# Patient Record
Sex: Female | Born: 1940 | Race: White | Hispanic: No | Smoking: Former smoker
Health system: Southern US, Community
[De-identification: ages and names within clinical notes are randomized; demographics above are authoritative.]

## PROBLEM LIST (undated history)

## (undated) DIAGNOSIS — E119 Type 2 diabetes mellitus without complications: Secondary | ICD-10-CM

## (undated) DIAGNOSIS — R079 Chest pain, unspecified: Secondary | ICD-10-CM

## (undated) DIAGNOSIS — Z96659 Presence of unspecified artificial knee joint: Secondary | ICD-10-CM

## (undated) DIAGNOSIS — I471 Supraventricular tachycardia, unspecified: Secondary | ICD-10-CM

## (undated) DIAGNOSIS — B3731 Acute candidiasis of vulva and vagina: Secondary | ICD-10-CM

## (undated) DIAGNOSIS — N184 Chronic kidney disease, stage 4 (severe): Secondary | ICD-10-CM

## (undated) DIAGNOSIS — N952 Postmenopausal atrophic vaginitis: Secondary | ICD-10-CM

## (undated) DIAGNOSIS — I2510 Atherosclerotic heart disease of native coronary artery without angina pectoris: Secondary | ICD-10-CM

## (undated) DIAGNOSIS — F419 Anxiety disorder, unspecified: Secondary | ICD-10-CM

## (undated) DIAGNOSIS — N200 Calculus of kidney: Secondary | ICD-10-CM

## (undated) DIAGNOSIS — N390 Urinary tract infection, site not specified: Secondary | ICD-10-CM

## (undated) DIAGNOSIS — C50919 Malignant neoplasm of unspecified site of unspecified female breast: Secondary | ICD-10-CM

## (undated) DIAGNOSIS — I5030 Unspecified diastolic (congestive) heart failure: Secondary | ICD-10-CM

## (undated) DIAGNOSIS — B373 Candidiasis of vulva and vagina: Secondary | ICD-10-CM

## (undated) DIAGNOSIS — R32 Unspecified urinary incontinence: Secondary | ICD-10-CM

## (undated) DIAGNOSIS — M1990 Unspecified osteoarthritis, unspecified site: Secondary | ICD-10-CM

## (undated) DIAGNOSIS — I509 Heart failure, unspecified: Secondary | ICD-10-CM

## (undated) DIAGNOSIS — E7800 Pure hypercholesterolemia, unspecified: Secondary | ICD-10-CM

## (undated) DIAGNOSIS — K922 Gastrointestinal hemorrhage, unspecified: Secondary | ICD-10-CM

## (undated) DIAGNOSIS — E669 Obesity, unspecified: Secondary | ICD-10-CM

## (undated) DIAGNOSIS — R300 Dysuria: Secondary | ICD-10-CM

## (undated) DIAGNOSIS — I10 Essential (primary) hypertension: Secondary | ICD-10-CM

## (undated) HISTORY — DX: Malignant neoplasm of unspecified site of unspecified female breast: C50.919

## (undated) HISTORY — PX: NASAL SEPTUM SURGERY: SHX37

## (undated) HISTORY — PX: CARDIAC CATHETERIZATION: SHX172

## (undated) HISTORY — PX: JOINT REPLACEMENT: SHX530

## (undated) HISTORY — DX: Supraventricular tachycardia: I47.1

## (undated) HISTORY — DX: Calculus of kidney: N20.0

## (undated) HISTORY — DX: Acute candidiasis of vulva and vagina: B37.31

## (undated) HISTORY — DX: Type 2 diabetes mellitus without complications: E11.9

## (undated) HISTORY — DX: Heart failure, unspecified: I50.9

## (undated) HISTORY — PX: OTHER SURGICAL HISTORY: SHX169

## (undated) HISTORY — DX: Dysuria: R30.0

## (undated) HISTORY — PX: TONSILLECTOMY: SUR1361

## (undated) HISTORY — DX: Unspecified urinary incontinence: R32

## (undated) HISTORY — DX: Anxiety disorder, unspecified: F41.9

## (undated) HISTORY — PX: CHOLECYSTECTOMY: SHX55

## (undated) HISTORY — DX: Supraventricular tachycardia, unspecified: I47.10

## (undated) HISTORY — DX: Pure hypercholesterolemia, unspecified: E78.00

## (undated) HISTORY — DX: Obesity, unspecified: E66.9

## (undated) HISTORY — DX: Unspecified osteoarthritis, unspecified site: M19.90

## (undated) HISTORY — DX: Presence of unspecified artificial knee joint: Z96.659

## (undated) HISTORY — DX: Candidiasis of vulva and vagina: B37.3

## (undated) HISTORY — DX: Urinary tract infection, site not specified: N39.0

## (undated) HISTORY — DX: Postmenopausal atrophic vaginitis: N95.2

## (undated) HISTORY — PX: FOOT SURGERY: SHX648

## (undated) HISTORY — DX: Essential (primary) hypertension: I10

---

## 1995-10-19 DIAGNOSIS — C50919 Malignant neoplasm of unspecified site of unspecified female breast: Secondary | ICD-10-CM

## 1995-10-19 HISTORY — DX: Malignant neoplasm of unspecified site of unspecified female breast: C50.919

## 1995-10-19 HISTORY — PX: MASTECTOMY: SHX3

## 2004-04-19 ENCOUNTER — Other Ambulatory Visit: Payer: Self-pay

## 2004-10-05 ENCOUNTER — Ambulatory Visit: Payer: Self-pay | Admitting: Surgery

## 2005-04-09 ENCOUNTER — Emergency Department: Payer: Self-pay | Admitting: Emergency Medicine

## 2005-10-04 ENCOUNTER — Inpatient Hospital Stay: Payer: Self-pay | Admitting: Internal Medicine

## 2006-04-05 ENCOUNTER — Other Ambulatory Visit: Payer: Self-pay

## 2006-05-19 ENCOUNTER — Inpatient Hospital Stay: Payer: Self-pay | Admitting: Specialist

## 2006-05-26 ENCOUNTER — Encounter: Payer: Self-pay | Admitting: Internal Medicine

## 2006-07-19 ENCOUNTER — Other Ambulatory Visit: Payer: Self-pay

## 2006-07-19 ENCOUNTER — Emergency Department: Payer: Self-pay | Admitting: Emergency Medicine

## 2006-10-14 ENCOUNTER — Ambulatory Visit: Payer: Self-pay | Admitting: Surgery

## 2007-07-06 ENCOUNTER — Ambulatory Visit: Payer: Self-pay | Admitting: Urology

## 2007-07-12 ENCOUNTER — Emergency Department: Payer: Self-pay | Admitting: Emergency Medicine

## 2007-08-10 ENCOUNTER — Ambulatory Visit: Payer: Self-pay | Admitting: Gastroenterology

## 2007-10-16 ENCOUNTER — Ambulatory Visit: Payer: Self-pay | Admitting: Surgery

## 2008-07-16 ENCOUNTER — Emergency Department: Payer: Self-pay | Admitting: Emergency Medicine

## 2008-08-20 ENCOUNTER — Ambulatory Visit: Payer: Self-pay | Admitting: Surgery

## 2008-10-16 ENCOUNTER — Ambulatory Visit: Payer: Self-pay | Admitting: Surgery

## 2008-11-16 ENCOUNTER — Inpatient Hospital Stay: Payer: Self-pay | Admitting: Internal Medicine

## 2008-11-22 ENCOUNTER — Emergency Department: Payer: Self-pay | Admitting: Emergency Medicine

## 2008-12-25 ENCOUNTER — Emergency Department: Payer: Self-pay | Admitting: Emergency Medicine

## 2009-03-04 ENCOUNTER — Emergency Department: Payer: Self-pay | Admitting: Emergency Medicine

## 2009-04-08 ENCOUNTER — Emergency Department: Payer: Self-pay | Admitting: Emergency Medicine

## 2009-05-24 ENCOUNTER — Emergency Department: Payer: Self-pay | Admitting: Internal Medicine

## 2009-07-29 ENCOUNTER — Emergency Department: Payer: Self-pay | Admitting: Emergency Medicine

## 2009-09-16 ENCOUNTER — Observation Stay: Payer: Self-pay | Admitting: Specialist

## 2009-10-21 ENCOUNTER — Ambulatory Visit: Payer: Self-pay | Admitting: Surgery

## 2009-11-22 ENCOUNTER — Emergency Department: Payer: Self-pay | Admitting: Internal Medicine

## 2009-12-15 ENCOUNTER — Emergency Department: Payer: Self-pay | Admitting: Emergency Medicine

## 2010-10-22 ENCOUNTER — Ambulatory Visit: Payer: Self-pay | Admitting: Surgery

## 2011-01-06 ENCOUNTER — Ambulatory Visit: Payer: Self-pay | Admitting: Unknown Physician Specialty

## 2011-01-17 ENCOUNTER — Ambulatory Visit: Payer: Self-pay | Admitting: Unknown Physician Specialty

## 2011-02-19 ENCOUNTER — Ambulatory Visit: Payer: Self-pay | Admitting: Unknown Physician Specialty

## 2011-03-19 ENCOUNTER — Ambulatory Visit: Payer: Self-pay | Admitting: Unknown Physician Specialty

## 2011-10-25 ENCOUNTER — Ambulatory Visit: Payer: Self-pay | Admitting: Surgery

## 2011-12-31 DIAGNOSIS — E119 Type 2 diabetes mellitus without complications: Secondary | ICD-10-CM | POA: Insufficient documentation

## 2011-12-31 DIAGNOSIS — N390 Urinary tract infection, site not specified: Secondary | ICD-10-CM | POA: Insufficient documentation

## 2012-03-10 ENCOUNTER — Emergency Department: Payer: Self-pay | Admitting: Emergency Medicine

## 2012-03-10 LAB — COMPREHENSIVE METABOLIC PANEL
Albumin: 3.8 g/dL (ref 3.4–5.0)
Alkaline Phosphatase: 65 U/L (ref 50–136)
Anion Gap: 9 (ref 7–16)
Bilirubin,Total: 0.4 mg/dL (ref 0.2–1.0)
Chloride: 94 mmol/L — ABNORMAL LOW (ref 98–107)
Co2: 30 mmol/L (ref 21–32)
Creatinine: 0.76 mg/dL (ref 0.60–1.30)
EGFR (African American): >60
EGFR (Non-African Amer.): >60
SGOT(AST): 20 U/L (ref 15–37)
SGPT (ALT): 19 U/L

## 2012-03-10 LAB — URINALYSIS, COMPLETE
Bacteria: NONE SEEN
Blood: NEGATIVE
Glucose,UR: NEGATIVE mg/dL (ref 0–75)
Ketone: NEGATIVE
Nitrite: NEGATIVE
Specific Gravity: 1.003 (ref 1.003–1.030)
Squamous Epithelial: <1
WBC UR: 3 /HPF (ref 0–5)

## 2012-03-10 LAB — TSH: Thyroid Stimulating Horm: 1.71 u[IU]/mL

## 2012-03-10 LAB — CBC
HGB: 10 g/dL — ABNORMAL LOW (ref 12.0–16.0)
RDW: 17.1 % — ABNORMAL HIGH (ref 11.5–14.5)
WBC: 7.3 10*3/uL (ref 3.6–11.0)

## 2012-07-11 ENCOUNTER — Emergency Department: Payer: Self-pay | Admitting: Emergency Medicine

## 2012-07-11 LAB — COMPREHENSIVE METABOLIC PANEL
Alkaline Phosphatase: 71 U/L (ref 50–136)
BUN: 17 mg/dL (ref 7–18)
Bilirubin,Total: 0.5 mg/dL (ref 0.2–1.0)
Chloride: 93 mmol/L — ABNORMAL LOW (ref 98–107)
Co2: 28 mmol/L (ref 21–32)
Creatinine: 0.71 mg/dL (ref 0.60–1.30)
EGFR (African American): >60
EGFR (Non-African Amer.): >60
Glucose: 150 mg/dL — ABNORMAL HIGH (ref 65–99)
SGOT(AST): 30 U/L (ref 15–37)
SGPT (ALT): 18 U/L (ref 12–78)

## 2012-07-11 LAB — URINALYSIS, COMPLETE
Bilirubin,UR: NEGATIVE
Blood: NEGATIVE
Ketone: NEGATIVE
Nitrite: NEGATIVE
Ph: 6 (ref 4.5–8.0)
Protein: 100
RBC,UR: <1 /HPF (ref 0–5)
Squamous Epithelial: 2

## 2012-07-11 LAB — CBC
HGB: 9.8 g/dL — ABNORMAL LOW (ref 12.0–16.0)
MCH: 24.7 pg — ABNORMAL LOW (ref 26.0–34.0)
MCHC: 32.7 g/dL (ref 32.0–36.0)
Platelet: 280 10*3/uL (ref 150–440)
RBC: 3.96 10*6/uL (ref 3.80–5.20)
RDW: 15.8 % — ABNORMAL HIGH (ref 11.5–14.5)
WBC: 9.5 10*3/uL (ref 3.6–11.0)

## 2012-07-11 LAB — CK TOTAL AND CKMB (NOT AT ARMC)
CK, Total: 107 U/L (ref 21–215)
CK-MB: 2.5 ng/mL (ref 0.5–3.6)

## 2012-07-11 LAB — TROPONIN I: Troponin-I: <0.02 ng/mL

## 2012-07-12 ENCOUNTER — Inpatient Hospital Stay: Payer: Self-pay | Admitting: Internal Medicine

## 2012-07-12 LAB — GENTAMICIN LEVEL, RANDOM: Gentamicin, Random: 6.2 ug/mL

## 2012-07-12 LAB — OSMOLALITY, URINE: Osmolality: 567 mOsm/kg

## 2012-07-13 LAB — BASIC METABOLIC PANEL
Anion Gap: 8 (ref 7–16)
Calcium, Total: 9.2 mg/dL (ref 8.5–10.1)
Co2: 29 mmol/L (ref 21–32)
Creatinine: 0.78 mg/dL (ref 0.60–1.30)
EGFR (African American): >60
EGFR (Non-African Amer.): >60
Glucose: 179 mg/dL — ABNORMAL HIGH (ref 65–99)
Potassium: 3.9 mmol/L (ref 3.5–5.1)
Sodium: 136 mmol/L (ref 136–145)

## 2012-07-26 DIAGNOSIS — R300 Dysuria: Secondary | ICD-10-CM | POA: Insufficient documentation

## 2012-08-11 DIAGNOSIS — N3020 Other chronic cystitis without hematuria: Secondary | ICD-10-CM | POA: Insufficient documentation

## 2012-10-27 ENCOUNTER — Emergency Department: Payer: Self-pay | Admitting: Emergency Medicine

## 2012-10-27 LAB — CBC
HCT: 27.9 % — ABNORMAL LOW (ref 35.0–47.0)
MCH: 24.7 pg — ABNORMAL LOW (ref 26.0–34.0)
MCHC: 32.4 g/dL (ref 32.0–36.0)
MCV: 76 fL — ABNORMAL LOW (ref 80–100)
Platelet: 288 10*3/uL (ref 150–440)
RBC: 3.66 10*6/uL — ABNORMAL LOW (ref 3.80–5.20)
WBC: 7.8 10*3/uL (ref 3.6–11.0)

## 2012-10-27 LAB — COMPREHENSIVE METABOLIC PANEL
Albumin: 3.6 g/dL (ref 3.4–5.0)
Anion Gap: 8 (ref 7–16)
Bilirubin,Total: 0.4 mg/dL (ref 0.2–1.0)
Calcium, Total: 9 mg/dL (ref 8.5–10.1)
Creatinine: 0.92 mg/dL (ref 0.60–1.30)
EGFR (African American): >60
Osmolality: 275 (ref 275–301)
Potassium: 4.4 mmol/L (ref 3.5–5.1)
SGOT(AST): 19 U/L (ref 15–37)
SGPT (ALT): 17 U/L (ref 12–78)
Sodium: 135 mmol/L — ABNORMAL LOW (ref 136–145)
Total Protein: 7.2 g/dL (ref 6.4–8.2)

## 2012-10-27 LAB — MAGNESIUM: Magnesium: 1.6 mg/dL — ABNORMAL LOW

## 2012-10-27 LAB — URINALYSIS, COMPLETE
Ketone: NEGATIVE
WBC UR: 9 /HPF (ref 0–5)

## 2012-10-27 LAB — CK TOTAL AND CKMB (NOT AT ARMC)
CK, Total: 76 U/L (ref 21–215)
CK-MB: 2.7 ng/mL (ref 0.5–3.6)

## 2012-10-27 LAB — TROPONIN I: Troponin-I: <0.02 ng/mL

## 2012-10-29 LAB — URINE CULTURE

## 2012-10-31 ENCOUNTER — Ambulatory Visit: Payer: Self-pay | Admitting: Surgery

## 2012-11-06 ENCOUNTER — Ambulatory Visit: Payer: Self-pay | Admitting: Oncology

## 2012-11-18 ENCOUNTER — Ambulatory Visit: Payer: Self-pay | Admitting: Oncology

## 2012-12-29 ENCOUNTER — Ambulatory Visit: Payer: Self-pay | Admitting: Oncology

## 2013-01-05 LAB — CBC CANCER CENTER
Basophil %: 0.6 %
Eosinophil #: 0.4 x10 3/mm (ref 0.0–0.7)
Eosinophil %: 5.2 %
HCT: 36.4 % (ref 35.0–47.0)
Lymphocyte %: 28.6 %
MCH: 27.4 pg (ref 26.0–34.0)
MCHC: 33.1 g/dL (ref 32.0–36.0)
Monocyte #: 0.8 x10 3/mm (ref 0.2–0.9)
Monocyte %: 10.8 %
RBC: 4.41 10*6/uL (ref 3.80–5.20)
WBC: 7 x10 3/mm (ref 3.6–11.0)

## 2013-01-05 LAB — IRON AND TIBC
Iron Bind.Cap.(Total): 394 ug/dL (ref 250–450)
Iron Saturation: 20 %
Iron: 78 ug/dL (ref 50–170)

## 2013-01-05 LAB — FERRITIN: Ferritin (ARMC): 28 ng/mL (ref 8–388)

## 2013-01-05 LAB — FOLATE: Folic Acid: 53.1 ng/mL (ref 3.1–100.0)

## 2013-01-16 ENCOUNTER — Ambulatory Visit: Payer: Self-pay | Admitting: Oncology

## 2013-02-19 DIAGNOSIS — R350 Frequency of micturition: Secondary | ICD-10-CM | POA: Insufficient documentation

## 2013-02-19 DIAGNOSIS — N3941 Urge incontinence: Secondary | ICD-10-CM | POA: Insufficient documentation

## 2013-04-06 ENCOUNTER — Ambulatory Visit: Payer: Self-pay | Admitting: Oncology

## 2013-04-09 LAB — CBC CANCER CENTER
Basophil #: 0.1 x10 3/mm (ref 0.0–0.1)
Eosinophil #: 0.4 x10 3/mm (ref 0.0–0.7)
Eosinophil %: 5.3 %
HCT: 36 % (ref 35.0–47.0)
HGB: 12.3 g/dL (ref 12.0–16.0)
Lymphocyte #: 2.1 x10 3/mm (ref 1.0–3.6)
Lymphocyte %: 25.6 %
MCH: 29 pg (ref 26.0–34.0)
MCHC: 34.2 g/dL (ref 32.0–36.0)
Monocyte #: 1 x10 3/mm — ABNORMAL HIGH (ref 0.2–0.9)
Platelet: 281 x10 3/mm (ref 150–440)
RBC: 4.25 10*6/uL (ref 3.80–5.20)
WBC: 8 x10 3/mm (ref 3.6–11.0)

## 2013-04-09 LAB — IRON AND TIBC
Iron: 88 ug/dL (ref 50–170)
Unbound Iron-Bind.Cap.: 354 ug/dL

## 2013-04-17 ENCOUNTER — Ambulatory Visit: Payer: Self-pay | Admitting: Oncology

## 2013-07-09 ENCOUNTER — Ambulatory Visit: Payer: Self-pay | Admitting: Oncology

## 2013-07-09 LAB — CBC CANCER CENTER
Eosinophil #: 0.4 x10 3/mm (ref 0.0–0.7)
Eosinophil %: 5.3 %
Lymphocyte #: 1.9 x10 3/mm (ref 1.0–3.6)
Lymphocyte %: 25.9 %
MCH: 28.2 pg (ref 26.0–34.0)
MCHC: 33.6 g/dL (ref 32.0–36.0)
MCV: 84 fL (ref 80–100)
Monocyte %: 10.8 %
RDW: 14.6 % — ABNORMAL HIGH (ref 11.5–14.5)
WBC: 7.3 x10 3/mm (ref 3.6–11.0)

## 2013-07-09 LAB — IRON AND TIBC
Iron Bind.Cap.(Total): 422 ug/dL (ref 250–450)
Iron: 74 ug/dL (ref 50–170)
Unbound Iron-Bind.Cap.: 348 ug/dL

## 2013-07-09 LAB — FERRITIN: Ferritin (ARMC): 17 ng/mL (ref 8–388)

## 2013-07-18 ENCOUNTER — Ambulatory Visit: Payer: Self-pay | Admitting: Oncology

## 2013-07-23 LAB — CBC
HCT: 35.3 % (ref 35.0–47.0)
MCV: 85 fL (ref 80–100)
RBC: 4.16 10*6/uL (ref 3.80–5.20)
WBC: 7 10*3/uL (ref 3.6–11.0)

## 2013-07-23 LAB — COMPREHENSIVE METABOLIC PANEL
Albumin: 3.9 g/dL (ref 3.4–5.0)
Anion Gap: 3 — ABNORMAL LOW (ref 7–16)
Creatinine: 0.88 mg/dL (ref 0.60–1.30)
EGFR (African American): >60
EGFR (Non-African Amer.): >60
Glucose: 175 mg/dL — ABNORMAL HIGH (ref 65–99)
Potassium: 4.2 mmol/L (ref 3.5–5.1)
SGPT (ALT): 20 U/L (ref 12–78)
Sodium: 133 mmol/L — ABNORMAL LOW (ref 136–145)

## 2013-07-23 LAB — TROPONIN I: Troponin-I: <0.02 ng/mL

## 2013-07-24 ENCOUNTER — Inpatient Hospital Stay: Payer: Self-pay | Admitting: Internal Medicine

## 2013-07-24 LAB — CK TOTAL AND CKMB (NOT AT ARMC)
CK, Total: 104 U/L (ref 21–215)
CK, Total: 106 U/L (ref 21–215)
CK-MB: 3 ng/mL (ref 0.5–3.6)
CK-MB: 3 ng/mL (ref 0.5–3.6)

## 2013-07-24 LAB — TROPONIN I
Troponin-I: <0.02 ng/mL
Troponin-I: <0.02 ng/mL

## 2013-07-24 LAB — TSH: Thyroid Stimulating Horm: 1.61 u[IU]/mL

## 2013-07-25 LAB — COMPREHENSIVE METABOLIC PANEL
Albumin: 3.2 g/dL — ABNORMAL LOW (ref 3.4–5.0)
BUN: 18 mg/dL (ref 7–18)
Bilirubin,Total: 0.4 mg/dL (ref 0.2–1.0)
Calcium, Total: 9.5 mg/dL (ref 8.5–10.1)
Co2: 34 mmol/L — ABNORMAL HIGH (ref 21–32)
Creatinine: 1 mg/dL (ref 0.60–1.30)
EGFR (African American): >60
EGFR (Non-African Amer.): 57 — ABNORMAL LOW
Glucose: 161 mg/dL — ABNORMAL HIGH (ref 65–99)
Potassium: 3.6 mmol/L (ref 3.5–5.1)
SGOT(AST): 17 U/L (ref 15–37)
SGPT (ALT): 16 U/L (ref 12–78)

## 2013-07-25 LAB — CBC WITH DIFFERENTIAL/PLATELET
Basophil %: 0.4 %
Eosinophil #: 0.3 10*3/uL (ref 0.0–0.7)
HCT: 33.3 % — ABNORMAL LOW (ref 35.0–47.0)
Lymphocyte #: 2.1 10*3/uL (ref 1.0–3.6)
Lymphocyte %: 28.8 %
MCH: 28.6 pg (ref 26.0–34.0)
MCV: 85 fL (ref 80–100)
Monocyte %: 11.6 %
Neutrophil %: 55.2 %
Platelet: 266 10*3/uL (ref 150–440)
RDW: 14.8 % — ABNORMAL HIGH (ref 11.5–14.5)
WBC: 7.3 10*3/uL (ref 3.6–11.0)

## 2013-07-26 LAB — PRO B NATRIURETIC PEPTIDE: B-Type Natriuretic Peptide: 483 pg/mL — ABNORMAL HIGH (ref 0–125)

## 2013-07-27 LAB — URINALYSIS, COMPLETE
Bilirubin,UR: NEGATIVE
Nitrite: NEGATIVE
Ph: 5 (ref 4.5–8.0)
Protein: 30
RBC,UR: 2 /HPF (ref 0–5)
Specific Gravity: 1.006 (ref 1.003–1.030)
Squamous Epithelial: 2
WBC UR: 303 /HPF (ref 0–5)

## 2013-07-27 LAB — BASIC METABOLIC PANEL
Co2: 34 mmol/L — ABNORMAL HIGH (ref 21–32)
Creatinine: 1.11 mg/dL (ref 0.60–1.30)
EGFR (Non-African Amer.): 50 — ABNORMAL LOW
Glucose: 252 mg/dL — ABNORMAL HIGH (ref 65–99)
Osmolality: 282 (ref 275–301)
Potassium: 4.1 mmol/L (ref 3.5–5.1)

## 2013-07-28 LAB — BASIC METABOLIC PANEL
Anion Gap: 3 — ABNORMAL LOW (ref 7–16)
Calcium, Total: 9.5 mg/dL (ref 8.5–10.1)
Chloride: 95 mmol/L — ABNORMAL LOW (ref 98–107)
Co2: 34 mmol/L — ABNORMAL HIGH (ref 21–32)
Creatinine: 1.3 mg/dL (ref 0.60–1.30)
EGFR (African American): 48 — ABNORMAL LOW
Glucose: 236 mg/dL — ABNORMAL HIGH (ref 65–99)
Potassium: 3.7 mmol/L (ref 3.5–5.1)

## 2013-07-29 LAB — CBC WITH DIFFERENTIAL/PLATELET
Basophil #: 0 10*3/uL (ref 0.0–0.1)
Basophil %: 0.7 %
Eosinophil #: 0.8 10*3/uL — ABNORMAL HIGH (ref 0.0–0.7)
Eosinophil %: 12.2 %
HCT: 33.7 % — ABNORMAL LOW (ref 35.0–47.0)
HGB: 11.6 g/dL — ABNORMAL LOW (ref 12.0–16.0)
Lymphocyte #: 1.8 10*3/uL (ref 1.0–3.6)
Lymphocyte %: 28.5 %
MCH: 29.2 pg (ref 26.0–34.0)
MCHC: 34.5 g/dL (ref 32.0–36.0)
Monocyte #: 0.9 x10 3/mm (ref 0.2–0.9)
Monocyte %: 13.7 %
Neutrophil #: 2.9 10*3/uL (ref 1.4–6.5)
Neutrophil %: 44.9 %
Platelet: 244 10*3/uL (ref 150–440)
RBC: 3.99 10*6/uL (ref 3.80–5.20)
RDW: 14.4 % (ref 11.5–14.5)
WBC: 6.4 10*3/uL (ref 3.6–11.0)

## 2013-07-29 LAB — BASIC METABOLIC PANEL
Anion Gap: 4 — ABNORMAL LOW (ref 7–16)
Calcium, Total: 9.5 mg/dL (ref 8.5–10.1)
Co2: 34 mmol/L — ABNORMAL HIGH (ref 21–32)
EGFR (Non-African Amer.): 40 — ABNORMAL LOW
Osmolality: 285 (ref 275–301)
Sodium: 132 mmol/L — ABNORMAL LOW (ref 136–145)

## 2013-07-29 LAB — URINE CULTURE

## 2013-07-30 LAB — BASIC METABOLIC PANEL
BUN: 38 mg/dL — ABNORMAL HIGH (ref 7–18)
Calcium, Total: 9.4 mg/dL (ref 8.5–10.1)
EGFR (African American): 47 — ABNORMAL LOW
EGFR (Non-African Amer.): 41 — ABNORMAL LOW
Glucose: 347 mg/dL — ABNORMAL HIGH (ref 65–99)
Osmolality: 289 (ref 275–301)
Sodium: 133 mmol/L — ABNORMAL LOW (ref 136–145)

## 2013-07-30 LAB — PRO B NATRIURETIC PEPTIDE: B-Type Natriuretic Peptide: 233 pg/mL — ABNORMAL HIGH (ref 0–125)

## 2013-07-31 LAB — COMPREHENSIVE METABOLIC PANEL
Alkaline Phosphatase: 79 U/L (ref 50–136)
BUN: 35 mg/dL — ABNORMAL HIGH (ref 7–18)
Bilirubin,Total: 0.4 mg/dL (ref 0.2–1.0)
Chloride: 94 mmol/L — ABNORMAL LOW (ref 98–107)
Co2: 34 mmol/L — ABNORMAL HIGH (ref 21–32)
Creatinine: 1.34 mg/dL — ABNORMAL HIGH (ref 0.60–1.30)
EGFR (African American): 46 — ABNORMAL LOW
EGFR (Non-African Amer.): 40 — ABNORMAL LOW
SGOT(AST): 33 U/L (ref 15–37)
Sodium: 132 mmol/L — ABNORMAL LOW (ref 136–145)
Total Protein: 7.5 g/dL (ref 6.4–8.2)

## 2013-08-01 LAB — BASIC METABOLIC PANEL
Anion Gap: 5 — ABNORMAL LOW (ref 7–16)
BUN: 42 mg/dL — ABNORMAL HIGH (ref 7–18)
Calcium, Total: 9.3 mg/dL (ref 8.5–10.1)
Chloride: 95 mmol/L — ABNORMAL LOW (ref 98–107)
Co2: 34 mmol/L — ABNORMAL HIGH (ref 21–32)
Creatinine: 1.76 mg/dL — ABNORMAL HIGH (ref 0.60–1.30)
EGFR (Non-African Amer.): 29 — ABNORMAL LOW
Glucose: 246 mg/dL — ABNORMAL HIGH (ref 65–99)
Potassium: 4.1 mmol/L (ref 3.5–5.1)
Sodium: 134 mmol/L — ABNORMAL LOW (ref 136–145)

## 2013-08-24 ENCOUNTER — Inpatient Hospital Stay: Payer: Self-pay

## 2013-08-24 LAB — TROPONIN I
Troponin-I: 0.38 ng/mL — ABNORMAL HIGH
Troponin-I: 0.39 ng/mL — ABNORMAL HIGH

## 2013-08-24 LAB — COMPREHENSIVE METABOLIC PANEL
Alkaline Phosphatase: 93 U/L (ref 50–136)
Anion Gap: 6 — ABNORMAL LOW (ref 7–16)
Bilirubin,Total: 0.7 mg/dL (ref 0.2–1.0)
Calcium, Total: 9.4 mg/dL (ref 8.5–10.1)
Chloride: 102 mmol/L (ref 98–107)
Creatinine: 1.28 mg/dL (ref 0.60–1.30)
EGFR (Non-African Amer.): 42 — ABNORMAL LOW
SGOT(AST): 16 U/L (ref 15–37)
SGPT (ALT): 16 U/L (ref 12–78)
Sodium: 136 mmol/L (ref 136–145)
Total Protein: 7.1 g/dL (ref 6.4–8.2)

## 2013-08-24 LAB — URINALYSIS, COMPLETE
Bilirubin,UR: NEGATIVE
Glucose,UR: 50 mg/dL (ref 0–75)
Nitrite: POSITIVE
Ph: 5 (ref 4.5–8.0)
Protein: >=500
Squamous Epithelial: <1
WBC UR: 273 /HPF (ref 0–5)

## 2013-08-24 LAB — CBC WITH DIFFERENTIAL/PLATELET
Basophil #: 0 10*3/uL (ref 0.0–0.1)
Eosinophil #: 0.3 10*3/uL (ref 0.0–0.7)
Eosinophil %: 3.6 %
HCT: 31.4 % — ABNORMAL LOW (ref 35.0–47.0)
HGB: 10.7 g/dL — ABNORMAL LOW (ref 12.0–16.0)
MCHC: 34.1 g/dL (ref 32.0–36.0)
MCV: 86 fL (ref 80–100)
Monocyte %: 6.2 %
Platelet: 200 10*3/uL (ref 150–440)
RBC: 3.67 10*6/uL — ABNORMAL LOW (ref 3.80–5.20)
RDW: 15 % — ABNORMAL HIGH (ref 11.5–14.5)

## 2013-08-24 LAB — CK TOTAL AND CKMB (NOT AT ARMC)
CK, Total: 71 U/L
CK, Total: 73 U/L (ref 21–215)
CK, Total: 65 U/L (ref 21–215)
CK-MB: 0.7 ng/mL

## 2013-08-24 LAB — PROTIME-INR
INR: 1.2
Prothrombin Time: 14.9 secs — ABNORMAL HIGH (ref 11.5–14.7)

## 2013-08-24 LAB — PRO B NATRIURETIC PEPTIDE: B-Type Natriuretic Peptide: 2083 pg/mL — ABNORMAL HIGH (ref 0–125)

## 2013-08-24 LAB — CK-MB
CK-MB: 1.5 ng/mL (ref 0.5–3.6)
CK-MB: 1.4 ng/mL (ref 0.5–3.6)

## 2013-08-25 LAB — CBC WITH DIFFERENTIAL/PLATELET
Basophil #: 0 10*3/uL (ref 0.0–0.1)
Eosinophil #: 0 10*3/uL (ref 0.0–0.7)
Eosinophil %: 0.2 %
HGB: 9.9 g/dL — ABNORMAL LOW (ref 12.0–16.0)
Lymphocyte #: 1 10*3/uL (ref 1.0–3.6)
Lymphocyte %: 15.9 %
Monocyte #: 0.6 x10 3/mm (ref 0.2–0.9)
Monocyte %: 10.1 %
RBC: 3.43 10*6/uL — ABNORMAL LOW (ref 3.80–5.20)
RDW: 14.6 % — ABNORMAL HIGH (ref 11.5–14.5)
WBC: 6.1 10*3/uL (ref 3.6–11.0)

## 2013-08-25 LAB — BASIC METABOLIC PANEL
BUN: 28 mg/dL — ABNORMAL HIGH (ref 7–18)
Chloride: 98 mmol/L (ref 98–107)
Co2: 33 mmol/L — ABNORMAL HIGH (ref 21–32)
EGFR (African American): 40 — ABNORMAL LOW
EGFR (Non-African Amer.): 34 — ABNORMAL LOW
Glucose: 264 mg/dL — ABNORMAL HIGH (ref 65–99)
Osmolality: 283 (ref 275–301)
Potassium: 4.7 mmol/L (ref 3.5–5.1)
Sodium: 134 mmol/L — ABNORMAL LOW (ref 136–145)

## 2013-08-26 LAB — BASIC METABOLIC PANEL
Anion Gap: 3 — ABNORMAL LOW (ref 7–16)
BUN: 33 mg/dL — ABNORMAL HIGH (ref 7–18)
Co2: 34 mmol/L — ABNORMAL HIGH (ref 21–32)
Creatinine: 1.48 mg/dL — ABNORMAL HIGH (ref 0.60–1.30)
Osmolality: 281 (ref 275–301)
Sodium: 134 mmol/L — ABNORMAL LOW (ref 136–145)

## 2013-08-26 LAB — URINE CULTURE

## 2013-08-27 LAB — BASIC METABOLIC PANEL
BUN: 24 mg/dL — ABNORMAL HIGH (ref 7–18)
Calcium, Total: 9.2 mg/dL (ref 8.5–10.1)
Chloride: 94 mmol/L — ABNORMAL LOW (ref 98–107)
Co2: 37 mmol/L — ABNORMAL HIGH (ref 21–32)
Creatinine: 1.17 mg/dL (ref 0.60–1.30)
EGFR (African American): 55 — ABNORMAL LOW
EGFR (Non-African Amer.): 47 — ABNORMAL LOW
Osmolality: 274 (ref 275–301)
Potassium: 4.3 mmol/L (ref 3.5–5.1)

## 2013-08-28 LAB — BASIC METABOLIC PANEL
Anion Gap: 1 — ABNORMAL LOW (ref 7–16)
Calcium, Total: 9.1 mg/dL (ref 8.5–10.1)
Chloride: 93 mmol/L — ABNORMAL LOW (ref 98–107)
Co2: 39 mmol/L — ABNORMAL HIGH (ref 21–32)
Creatinine: 1.09 mg/dL (ref 0.60–1.30)
EGFR (African American): 60 — ABNORMAL LOW
EGFR (Non-African Amer.): 51 — ABNORMAL LOW
Glucose: 196 mg/dL — ABNORMAL HIGH (ref 65–99)
Osmolality: 275 (ref 275–301)
Potassium: 4.1 mmol/L (ref 3.5–5.1)

## 2013-08-29 LAB — CULTURE, BLOOD (SINGLE)

## 2013-08-31 LAB — CULTURE, BLOOD (SINGLE)

## 2013-11-01 ENCOUNTER — Ambulatory Visit: Payer: Self-pay | Admitting: Surgery

## 2013-11-08 ENCOUNTER — Ambulatory Visit: Payer: Self-pay | Admitting: Oncology

## 2013-11-08 LAB — CBC CANCER CENTER
BASOS PCT: 0.6 %
Basophil #: 0 x10 3/mm (ref 0.0–0.1)
EOS ABS: 0.4 x10 3/mm (ref 0.0–0.7)
EOS PCT: 4.9 %
HCT: 33.5 % — ABNORMAL LOW (ref 35.0–47.0)
HGB: 10.9 g/dL — ABNORMAL LOW (ref 12.0–16.0)
LYMPHS ABS: 2.4 x10 3/mm (ref 1.0–3.6)
LYMPHS PCT: 31.5 %
MCH: 28.5 pg (ref 26.0–34.0)
MCHC: 32.5 g/dL (ref 32.0–36.0)
MCV: 88 fL (ref 80–100)
MONO ABS: 0.9 x10 3/mm (ref 0.2–0.9)
Monocyte %: 11.3 %
NEUTROS ABS: 3.9 x10 3/mm (ref 1.4–6.5)
NEUTROS PCT: 51.7 %
Platelet: 260 x10 3/mm (ref 150–440)
RBC: 3.82 10*6/uL (ref 3.80–5.20)
RDW: 14.9 % — AB (ref 11.5–14.5)
WBC: 7.6 x10 3/mm (ref 3.6–11.0)

## 2013-11-08 LAB — IRON AND TIBC
IRON BIND. CAP.(TOTAL): 359 ug/dL (ref 250–450)
IRON SATURATION: 21 %
IRON: 76 ug/dL (ref 50–170)
Unbound Iron-Bind.Cap.: 283 ug/dL

## 2013-11-08 LAB — FERRITIN: Ferritin (ARMC): 27 ng/mL (ref 8–388)

## 2013-11-18 ENCOUNTER — Ambulatory Visit: Payer: Self-pay | Admitting: Oncology

## 2014-05-17 ENCOUNTER — Inpatient Hospital Stay: Payer: Self-pay | Admitting: Specialist

## 2014-05-17 LAB — CBC
HCT: 30.9 % — ABNORMAL LOW (ref 35.0–47.0)
HGB: 10.1 g/dL — ABNORMAL LOW (ref 12.0–16.0)
MCH: 28.2 pg (ref 26.0–34.0)
MCHC: 32.7 g/dL (ref 32.0–36.0)
MCV: 86 fL (ref 80–100)
PLATELETS: 216 10*3/uL (ref 150–440)
RBC: 3.59 10*6/uL — ABNORMAL LOW (ref 3.80–5.20)
RDW: 14.4 % (ref 11.5–14.5)
WBC: 6.5 10*3/uL (ref 3.6–11.0)

## 2014-05-17 LAB — URINALYSIS, COMPLETE
BILIRUBIN, UR: NEGATIVE
KETONE: NEGATIVE
NITRITE: NEGATIVE
Ph: 6 (ref 4.5–8.0)
RBC,UR: 1 /HPF (ref 0–5)
SPECIFIC GRAVITY: 1.011 (ref 1.003–1.030)
WBC UR: 76 /HPF (ref 0–5)

## 2014-05-17 LAB — BASIC METABOLIC PANEL
Anion Gap: 3 — ABNORMAL LOW (ref 7–16)
BUN: 22 mg/dL — ABNORMAL HIGH (ref 7–18)
CHLORIDE: 98 mmol/L (ref 98–107)
CREATININE: 1.06 mg/dL (ref 0.60–1.30)
Calcium, Total: 9 mg/dL (ref 8.5–10.1)
Co2: 30 mmol/L (ref 21–32)
EGFR (African American): >60
EGFR (Non-African Amer.): 53 — ABNORMAL LOW
GLUCOSE: 204 mg/dL — AB (ref 65–99)
OSMOLALITY: 272 (ref 275–301)
POTASSIUM: 5.2 mmol/L — AB (ref 3.5–5.1)
SODIUM: 131 mmol/L — AB (ref 136–145)

## 2014-05-18 LAB — CBC WITH DIFFERENTIAL/PLATELET
Basophil #: 0 10*3/uL (ref 0.0–0.1)
Basophil %: 0.6 %
EOS PCT: 5.8 %
Eosinophil #: 0.4 10*3/uL (ref 0.0–0.7)
HCT: 30.7 % — ABNORMAL LOW (ref 35.0–47.0)
HGB: 10.3 g/dL — ABNORMAL LOW (ref 12.0–16.0)
Lymphocyte #: 1.6 10*3/uL (ref 1.0–3.6)
Lymphocyte %: 22.3 %
MCH: 28.8 pg (ref 26.0–34.0)
MCHC: 33.7 g/dL (ref 32.0–36.0)
MCV: 85 fL (ref 80–100)
MONO ABS: 0.8 x10 3/mm (ref 0.2–0.9)
Monocyte %: 10.9 %
NEUTROS PCT: 60.4 %
Neutrophil #: 4.5 10*3/uL (ref 1.4–6.5)
PLATELETS: 204 10*3/uL (ref 150–440)
RBC: 3.59 10*6/uL — AB (ref 3.80–5.20)
RDW: 14.2 % (ref 11.5–14.5)
WBC: 7.4 10*3/uL (ref 3.6–11.0)

## 2014-05-18 LAB — BASIC METABOLIC PANEL
ANION GAP: 6 — AB (ref 7–16)
BUN: 17 mg/dL (ref 7–18)
CALCIUM: 9.2 mg/dL (ref 8.5–10.1)
CO2: 28 mmol/L (ref 21–32)
CREATININE: 1.02 mg/dL (ref 0.60–1.30)
Chloride: 103 mmol/L (ref 98–107)
EGFR (African American): >60
EGFR (Non-African Amer.): 55 — ABNORMAL LOW
Glucose: 143 mg/dL — ABNORMAL HIGH (ref 65–99)
OSMOLALITY: 278 (ref 275–301)
Potassium: 4.4 mmol/L (ref 3.5–5.1)
SODIUM: 137 mmol/L (ref 136–145)

## 2014-05-19 LAB — BASIC METABOLIC PANEL
Anion Gap: 5 — ABNORMAL LOW (ref 7–16)
BUN: 23 mg/dL — ABNORMAL HIGH (ref 7–18)
CALCIUM: 9.3 mg/dL (ref 8.5–10.1)
CREATININE: 1.19 mg/dL (ref 0.60–1.30)
Chloride: 100 mmol/L (ref 98–107)
Co2: 34 mmol/L — ABNORMAL HIGH (ref 21–32)
EGFR (Non-African Amer.): 46 — ABNORMAL LOW
GFR CALC AF AMER: 53 — AB
Glucose: 125 mg/dL — ABNORMAL HIGH (ref 65–99)
Osmolality: 283 (ref 275–301)
Potassium: 4.3 mmol/L (ref 3.5–5.1)
Sodium: 139 mmol/L (ref 136–145)

## 2014-05-19 LAB — URINE CULTURE

## 2014-05-22 ENCOUNTER — Observation Stay: Payer: Self-pay | Admitting: Specialist

## 2014-05-22 LAB — URINALYSIS, COMPLETE
BACTERIA: NONE SEEN
BILIRUBIN, UR: NEGATIVE
BLOOD: NEGATIVE
Glucose,UR: NEGATIVE mg/dL (ref 0–75)
Ketone: NEGATIVE
Leukocyte Esterase: NEGATIVE
Nitrite: NEGATIVE
Ph: 6 (ref 4.5–8.0)
Protein: 100
RBC,UR: NONE SEEN /HPF (ref 0–5)
Specific Gravity: 1.014 (ref 1.003–1.030)
Squamous Epithelial: NONE SEEN
WBC UR: NONE SEEN /HPF (ref 0–5)

## 2014-05-22 LAB — CBC
HCT: 33.4 % — ABNORMAL LOW (ref 35.0–47.0)
HGB: 11 g/dL — ABNORMAL LOW (ref 12.0–16.0)
MCH: 28.2 pg (ref 26.0–34.0)
MCHC: 32.9 g/dL (ref 32.0–36.0)
MCV: 86 fL (ref 80–100)
PLATELETS: 238 10*3/uL (ref 150–440)
RBC: 3.9 10*6/uL (ref 3.80–5.20)
RDW: 14.4 % (ref 11.5–14.5)
WBC: 7.1 10*3/uL (ref 3.6–11.0)

## 2014-05-22 LAB — COMPREHENSIVE METABOLIC PANEL
ALBUMIN: 3.2 g/dL — AB (ref 3.4–5.0)
ALT: 22 U/L
ANION GAP: 5 — AB (ref 7–16)
AST: 26 U/L (ref 15–37)
Alkaline Phosphatase: 62 U/L
BUN: 29 mg/dL — AB (ref 7–18)
Bilirubin,Total: 0.3 mg/dL (ref 0.2–1.0)
CALCIUM: 8.8 mg/dL (ref 8.5–10.1)
Chloride: 99 mmol/L (ref 98–107)
Co2: 29 mmol/L (ref 21–32)
Creatinine: 1.2 mg/dL (ref 0.60–1.30)
EGFR (African American): 53 — ABNORMAL LOW
GFR CALC NON AF AMER: 45 — AB
Glucose: 107 mg/dL — ABNORMAL HIGH (ref 65–99)
Osmolality: 273 (ref 275–301)
POTASSIUM: 4.7 mmol/L (ref 3.5–5.1)
Sodium: 133 mmol/L — ABNORMAL LOW (ref 136–145)
Total Protein: 7.1 g/dL (ref 6.4–8.2)

## 2014-05-22 LAB — TROPONIN I

## 2014-05-23 LAB — BASIC METABOLIC PANEL
ANION GAP: 7 (ref 7–16)
BUN: 20 mg/dL — ABNORMAL HIGH (ref 7–18)
CREATININE: 1.1 mg/dL (ref 0.60–1.30)
Calcium, Total: 8.9 mg/dL (ref 8.5–10.1)
Chloride: 96 mmol/L — ABNORMAL LOW (ref 98–107)
Co2: 31 mmol/L (ref 21–32)
EGFR (Non-African Amer.): 50 — ABNORMAL LOW
GFR CALC AF AMER: 58 — AB
Glucose: 160 mg/dL — ABNORMAL HIGH (ref 65–99)
Osmolality: 274 (ref 275–301)
Potassium: 4.6 mmol/L (ref 3.5–5.1)
SODIUM: 134 mmol/L — AB (ref 136–145)

## 2014-05-23 LAB — CBC WITH DIFFERENTIAL/PLATELET
Basophil #: 0 10*3/uL (ref 0.0–0.1)
Basophil %: 0.6 %
EOS ABS: 0.7 10*3/uL (ref 0.0–0.7)
Eosinophil %: 9.5 %
HCT: 34 % — AB (ref 35.0–47.0)
HGB: 11.3 g/dL — ABNORMAL LOW (ref 12.0–16.0)
Lymphocyte #: 1.9 10*3/uL (ref 1.0–3.6)
Lymphocyte %: 26.1 %
MCH: 28.7 pg (ref 26.0–34.0)
MCHC: 33.4 g/dL (ref 32.0–36.0)
MCV: 86 fL (ref 80–100)
Monocyte #: 0.7 x10 3/mm (ref 0.2–0.9)
Monocyte %: 10.1 %
Neutrophil #: 3.9 10*3/uL (ref 1.4–6.5)
Neutrophil %: 53.7 %
Platelet: 256 10*3/uL (ref 150–440)
RBC: 3.96 10*6/uL (ref 3.80–5.20)
RDW: 14.6 % — AB (ref 11.5–14.5)
WBC: 7.2 10*3/uL (ref 3.6–11.0)

## 2014-05-24 LAB — URINE CULTURE

## 2014-05-27 LAB — CULTURE, BLOOD (SINGLE)

## 2014-05-28 ENCOUNTER — Emergency Department: Payer: Self-pay | Admitting: Emergency Medicine

## 2014-05-28 LAB — BASIC METABOLIC PANEL
Anion Gap: 5 — ABNORMAL LOW (ref 7–16)
BUN: 26 mg/dL — AB (ref 7–18)
CHLORIDE: 99 mmol/L (ref 98–107)
CREATININE: 1.27 mg/dL (ref 0.60–1.30)
Calcium, Total: 9 mg/dL (ref 8.5–10.1)
Co2: 30 mmol/L (ref 21–32)
EGFR (African American): 49 — ABNORMAL LOW
GFR CALC NON AF AMER: 42 — AB
GLUCOSE: 143 mg/dL — AB (ref 65–99)
Osmolality: 275 (ref 275–301)
Potassium: 4.5 mmol/L (ref 3.5–5.1)
Sodium: 134 mmol/L — ABNORMAL LOW (ref 136–145)

## 2014-05-28 LAB — CBC WITH DIFFERENTIAL/PLATELET
BASOS PCT: 1 %
Basophil #: 0.1 10*3/uL (ref 0.0–0.1)
EOS PCT: 7.5 %
Eosinophil #: 0.4 10*3/uL (ref 0.0–0.7)
HCT: 32 % — AB (ref 35.0–47.0)
HGB: 10.6 g/dL — AB (ref 12.0–16.0)
LYMPHS PCT: 27.2 %
Lymphocyte #: 1.5 10*3/uL (ref 1.0–3.6)
MCH: 28.3 pg (ref 26.0–34.0)
MCHC: 33.2 g/dL (ref 32.0–36.0)
MCV: 85 fL (ref 80–100)
MONO ABS: 0.8 x10 3/mm (ref 0.2–0.9)
MONOS PCT: 13.9 %
NEUTROS PCT: 50.4 %
Neutrophil #: 2.7 10*3/uL (ref 1.4–6.5)
Platelet: 248 10*3/uL (ref 150–440)
RBC: 3.76 10*6/uL — AB (ref 3.80–5.20)
RDW: 14.6 % — AB (ref 11.5–14.5)
WBC: 5.4 10*3/uL (ref 3.6–11.0)

## 2014-05-28 LAB — URINALYSIS, COMPLETE
BILIRUBIN, UR: NEGATIVE
Bacteria: NONE SEEN
Blood: NEGATIVE
GLUCOSE, UR: NEGATIVE mg/dL (ref 0–75)
Ketone: NEGATIVE
Leukocyte Esterase: NEGATIVE
NITRITE: NEGATIVE
Ph: 5 (ref 4.5–8.0)
Protein: 100
RBC,UR: 4 /HPF (ref 0–5)
SPECIFIC GRAVITY: 1.018 (ref 1.003–1.030)
Squamous Epithelial: 1

## 2014-07-09 ENCOUNTER — Emergency Department: Payer: Self-pay | Admitting: Emergency Medicine

## 2014-07-09 LAB — URINALYSIS, COMPLETE
BILIRUBIN, UR: NEGATIVE
Bacteria: NONE SEEN
Blood: NEGATIVE
Glucose,UR: NEGATIVE mg/dL (ref 0–75)
Ketone: NEGATIVE
LEUKOCYTE ESTERASE: NEGATIVE
Nitrite: NEGATIVE
PH: 5 (ref 4.5–8.0)
Protein: 25
RBC,UR: <1 /HPF (ref 0–5)
SPECIFIC GRAVITY: 1.005 (ref 1.003–1.030)
WBC UR: 1 /HPF (ref 0–5)

## 2014-07-09 LAB — BASIC METABOLIC PANEL
ANION GAP: 4 — AB (ref 7–16)
BUN: 28 mg/dL — AB (ref 7–18)
CHLORIDE: 101 mmol/L (ref 98–107)
CREATININE: 1.21 mg/dL (ref 0.60–1.30)
Calcium, Total: 8.9 mg/dL (ref 8.5–10.1)
Co2: 31 mmol/L (ref 21–32)
EGFR (African American): 52 — ABNORMAL LOW
GFR CALC NON AF AMER: 45 — AB
GLUCOSE: 174 mg/dL — AB (ref 65–99)
Osmolality: 282 (ref 275–301)
POTASSIUM: 4.6 mmol/L (ref 3.5–5.1)
Sodium: 136 mmol/L (ref 136–145)

## 2014-07-09 LAB — CBC
HCT: 31.1 % — ABNORMAL LOW (ref 35.0–47.0)
HGB: 9.7 g/dL — ABNORMAL LOW (ref 12.0–16.0)
MCH: 27.6 pg (ref 26.0–34.0)
MCHC: 31.2 g/dL — AB (ref 32.0–36.0)
MCV: 88 fL (ref 80–100)
Platelet: 226 10*3/uL (ref 150–440)
RBC: 3.52 10*6/uL — ABNORMAL LOW (ref 3.80–5.20)
RDW: 14.9 % — ABNORMAL HIGH (ref 11.5–14.5)
WBC: 6.8 10*3/uL (ref 3.6–11.0)

## 2014-07-09 LAB — PRO B NATRIURETIC PEPTIDE: B-Type Natriuretic Peptide: 1872 pg/mL — ABNORMAL HIGH (ref 0–125)

## 2014-09-02 ENCOUNTER — Inpatient Hospital Stay: Payer: Self-pay | Admitting: Internal Medicine

## 2014-09-02 LAB — CBC WITH DIFFERENTIAL/PLATELET
BASOS ABS: 0 10*3/uL (ref 0.0–0.1)
Basophil %: 0.4 %
Eosinophil #: 0.2 10*3/uL (ref 0.0–0.7)
Eosinophil %: 4.3 %
HCT: 24.3 % — ABNORMAL LOW (ref 35.0–47.0)
HGB: 7.5 g/dL — ABNORMAL LOW (ref 12.0–16.0)
LYMPHS ABS: 0.8 10*3/uL — AB (ref 1.0–3.6)
Lymphocyte %: 13.8 %
MCH: 27.3 pg (ref 26.0–34.0)
MCHC: 31.1 g/dL — ABNORMAL LOW (ref 32.0–36.0)
MCV: 88 fL (ref 80–100)
MONO ABS: 0.7 x10 3/mm (ref 0.2–0.9)
Monocyte %: 11.7 %
Neutrophil #: 4 10*3/uL (ref 1.4–6.5)
Neutrophil %: 69.8 %
PLATELETS: 261 10*3/uL (ref 150–440)
RBC: 2.76 10*6/uL — AB (ref 3.80–5.20)
RDW: 14.6 % — AB (ref 11.5–14.5)
WBC: 5.8 10*3/uL (ref 3.6–11.0)

## 2014-09-02 LAB — URINALYSIS, COMPLETE
BILIRUBIN, UR: NEGATIVE
Glucose,UR: NEGATIVE mg/dL (ref 0–75)
Hyaline Cast: 15
Ketone: NEGATIVE
NITRITE: NEGATIVE
PH: 5 (ref 4.5–8.0)
Protein: 30
RBC,UR: 7 /HPF (ref 0–5)
Specific Gravity: 1.016 (ref 1.003–1.030)
Squamous Epithelial: 1
WBC UR: 6 /HPF (ref 0–5)

## 2014-09-02 LAB — RETICULOCYTES
ABSOLUTE RETIC COUNT: 0.097 10*6/uL (ref 0.019–0.186)
Reticulocyte: 3.5 % — ABNORMAL HIGH (ref 0.4–3.1)

## 2014-09-02 LAB — BASIC METABOLIC PANEL
Anion Gap: 5 — ABNORMAL LOW (ref 7–16)
BUN: 34 mg/dL — AB (ref 7–18)
CALCIUM: 8.2 mg/dL — AB (ref 8.5–10.1)
Chloride: 103 mmol/L (ref 98–107)
Co2: 30 mmol/L (ref 21–32)
Creatinine: 1.28 mg/dL (ref 0.60–1.30)
EGFR (Non-African Amer.): 44 — ABNORMAL LOW
GFR CALC AF AMER: 53 — AB
Glucose: 281 mg/dL — ABNORMAL HIGH (ref 65–99)
Osmolality: 293 (ref 275–301)
POTASSIUM: 4.7 mmol/L (ref 3.5–5.1)
Sodium: 138 mmol/L (ref 136–145)

## 2014-09-02 LAB — IRON AND TIBC
IRON BIND. CAP.(TOTAL): 438 ug/dL (ref 250–450)
Iron Saturation: 4 %
Iron: 18 ug/dL — ABNORMAL LOW (ref 50–170)
Unbound Iron-Bind.Cap.: 420 ug/dL

## 2014-09-02 LAB — TROPONIN I: Troponin-I: <0.02 ng/mL

## 2014-09-02 LAB — FERRITIN: FERRITIN (ARMC): 8 ng/mL (ref 8–388)

## 2014-09-03 LAB — CBC WITH DIFFERENTIAL/PLATELET
BASOS ABS: 0 10*3/uL (ref 0.0–0.1)
Basophil %: 0.6 %
Eosinophil #: 0.2 10*3/uL (ref 0.0–0.7)
Eosinophil %: 3.7 %
HCT: 26.9 % — AB (ref 35.0–47.0)
HGB: 8.7 g/dL — ABNORMAL LOW (ref 12.0–16.0)
Lymphocyte #: 1.5 10*3/uL (ref 1.0–3.6)
Lymphocyte %: 25.4 %
MCH: 27.7 pg (ref 26.0–34.0)
MCHC: 32.4 g/dL (ref 32.0–36.0)
MCV: 86 fL (ref 80–100)
MONO ABS: 0.7 x10 3/mm (ref 0.2–0.9)
Monocyte %: 12.1 %
Neutrophil #: 3.4 10*3/uL (ref 1.4–6.5)
Neutrophil %: 58.2 %
Platelet: 258 10*3/uL (ref 150–440)
RBC: 3.14 10*6/uL — ABNORMAL LOW (ref 3.80–5.20)
RDW: 14.4 % (ref 11.5–14.5)
WBC: 5.8 10*3/uL (ref 3.6–11.0)

## 2014-09-03 LAB — BASIC METABOLIC PANEL
Anion Gap: 4 — ABNORMAL LOW (ref 7–16)
BUN: 26 mg/dL — ABNORMAL HIGH (ref 7–18)
CO2: 32 mmol/L (ref 21–32)
Calcium, Total: 8.8 mg/dL (ref 8.5–10.1)
Chloride: 101 mmol/L (ref 98–107)
Creatinine: 1.12 mg/dL (ref 0.60–1.30)
EGFR (Non-African Amer.): 51 — ABNORMAL LOW
GLUCOSE: 88 mg/dL (ref 65–99)
Osmolality: 278 (ref 275–301)
Potassium: 4.2 mmol/L (ref 3.5–5.1)
Sodium: 137 mmol/L (ref 136–145)

## 2014-09-04 LAB — HEMOGLOBIN: HGB: 8.2 g/dL — ABNORMAL LOW (ref 12.0–16.0)

## 2014-09-04 LAB — HEMOGLOBIN A1C: Hemoglobin A1C: 6.2 % (ref 4.2–6.3)

## 2014-09-04 LAB — URINE CULTURE

## 2014-09-06 LAB — CBC WITH DIFFERENTIAL/PLATELET
BASOS ABS: 0 10*3/uL (ref 0.0–0.1)
Basophil %: 0.5 %
EOS PCT: 3.6 %
Eosinophil #: 0.2 10*3/uL (ref 0.0–0.7)
HCT: 26.3 % — ABNORMAL LOW (ref 35.0–47.0)
HGB: 8.3 g/dL — ABNORMAL LOW (ref 12.0–16.0)
Lymphocyte #: 1.1 10*3/uL (ref 1.0–3.6)
Lymphocyte %: 16.3 %
MCH: 27 pg (ref 26.0–34.0)
MCHC: 31.5 g/dL — AB (ref 32.0–36.0)
MCV: 86 fL (ref 80–100)
MONO ABS: 0.9 x10 3/mm (ref 0.2–0.9)
Monocyte %: 13.2 %
Neutrophil #: 4.3 10*3/uL (ref 1.4–6.5)
Neutrophil %: 66.4 %
Platelet: 236 10*3/uL (ref 150–440)
RBC: 3.07 10*6/uL — ABNORMAL LOW (ref 3.80–5.20)
RDW: 14.4 % (ref 11.5–14.5)
WBC: 6.5 10*3/uL (ref 3.6–11.0)

## 2014-09-06 LAB — BASIC METABOLIC PANEL
ANION GAP: 7 (ref 7–16)
BUN: 16 mg/dL (ref 7–18)
CALCIUM: 8.4 mg/dL — AB (ref 8.5–10.1)
CHLORIDE: 102 mmol/L (ref 98–107)
CREATININE: 1.39 mg/dL — AB (ref 0.60–1.30)
Co2: 29 mmol/L (ref 21–32)
EGFR (African American): 48 — ABNORMAL LOW
EGFR (Non-African Amer.): 40 — ABNORMAL LOW
GLUCOSE: 177 mg/dL — AB (ref 65–99)
Osmolality: 281 (ref 275–301)
POTASSIUM: 4.2 mmol/L (ref 3.5–5.1)
Sodium: 138 mmol/L (ref 136–145)

## 2014-09-07 LAB — BASIC METABOLIC PANEL
ANION GAP: 5 — AB (ref 7–16)
BUN: 17 mg/dL (ref 7–18)
CHLORIDE: 101 mmol/L (ref 98–107)
CREATININE: 1.49 mg/dL — AB (ref 0.60–1.30)
Calcium, Total: 8.5 mg/dL (ref 8.5–10.1)
Co2: 30 mmol/L (ref 21–32)
EGFR (African American): 44 — ABNORMAL LOW
EGFR (Non-African Amer.): 37 — ABNORMAL LOW
Glucose: 210 mg/dL — ABNORMAL HIGH (ref 65–99)
OSMOLALITY: 280 (ref 275–301)
Potassium: 4.9 mmol/L (ref 3.5–5.1)
Sodium: 136 mmol/L (ref 136–145)

## 2014-09-08 LAB — BASIC METABOLIC PANEL
ANION GAP: 6 — AB (ref 7–16)
BUN: 16 mg/dL (ref 7–18)
CHLORIDE: 103 mmol/L (ref 98–107)
CREATININE: 1.45 mg/dL — AB (ref 0.60–1.30)
Calcium, Total: 8.3 mg/dL — ABNORMAL LOW (ref 8.5–10.1)
Co2: 28 mmol/L (ref 21–32)
EGFR (African American): 46 — ABNORMAL LOW
EGFR (Non-African Amer.): 38 — ABNORMAL LOW
Glucose: 211 mg/dL — ABNORMAL HIGH (ref 65–99)
Osmolality: 281 (ref 275–301)
Potassium: 5.2 mmol/L — ABNORMAL HIGH (ref 3.5–5.1)
Sodium: 137 mmol/L (ref 136–145)

## 2014-09-08 LAB — HEMOGLOBIN: HGB: 8.1 g/dL — ABNORMAL LOW (ref 12.0–16.0)

## 2014-09-08 LAB — URINALYSIS, COMPLETE
BILIRUBIN, UR: NEGATIVE
GLUCOSE, UR: NEGATIVE mg/dL (ref 0–75)
Ketone: NEGATIVE
Nitrite: NEGATIVE
Ph: 5 (ref 4.5–8.0)
Protein: 30
SPECIFIC GRAVITY: 1.008 (ref 1.003–1.030)
Squamous Epithelial: <1

## 2014-09-09 LAB — CBC WITH DIFFERENTIAL/PLATELET
BASOS PCT: 0.7 %
Basophil #: 0 10*3/uL (ref 0.0–0.1)
Eosinophil #: 0.5 10*3/uL (ref 0.0–0.7)
Eosinophil %: 8.7 %
HCT: 27.7 % — AB (ref 35.0–47.0)
HGB: 8.8 g/dL — ABNORMAL LOW (ref 12.0–16.0)
LYMPHS ABS: 1.3 10*3/uL (ref 1.0–3.6)
Lymphocyte %: 22.1 %
MCH: 26.6 pg (ref 26.0–34.0)
MCHC: 31.8 g/dL — ABNORMAL LOW (ref 32.0–36.0)
MCV: 84 fL (ref 80–100)
Monocyte #: 1 x10 3/mm — ABNORMAL HIGH (ref 0.2–0.9)
Monocyte %: 16.1 %
NEUTROS PCT: 52.4 %
Neutrophil #: 3.2 10*3/uL (ref 1.4–6.5)
Platelet: 252 10*3/uL (ref 150–440)
RBC: 3.32 10*6/uL — ABNORMAL LOW (ref 3.80–5.20)
RDW: 14.5 % (ref 11.5–14.5)
WBC: 6.1 10*3/uL (ref 3.6–11.0)

## 2014-09-17 LAB — URINE CULTURE

## 2014-09-25 DIAGNOSIS — D649 Anemia, unspecified: Secondary | ICD-10-CM | POA: Insufficient documentation

## 2014-09-25 DIAGNOSIS — R1030 Lower abdominal pain, unspecified: Secondary | ICD-10-CM | POA: Insufficient documentation

## 2014-10-14 ENCOUNTER — Ambulatory Visit: Payer: Self-pay | Admitting: Oncology

## 2014-10-14 LAB — IRON AND TIBC
IRON BIND. CAP.(TOTAL): 415 ug/dL (ref 250–450)
IRON: 58 ug/dL (ref 50–170)
Iron Saturation: 14 %
Unbound Iron-Bind.Cap.: 357 ug/dL

## 2014-10-14 LAB — CBC CANCER CENTER
BASOS ABS: 0 x10 3/mm (ref 0.0–0.1)
BASOS PCT: 0.5 %
EOS ABS: 0.2 x10 3/mm (ref 0.0–0.7)
Eosinophil %: 3.5 %
HCT: 30.6 % — AB (ref 35.0–47.0)
HGB: 9.6 g/dL — ABNORMAL LOW (ref 12.0–16.0)
LYMPHS PCT: 23.1 %
Lymphocyte #: 1.4 x10 3/mm (ref 1.0–3.6)
MCH: 25.2 pg — ABNORMAL LOW (ref 26.0–34.0)
MCHC: 31.3 g/dL — ABNORMAL LOW (ref 32.0–36.0)
MCV: 81 fL (ref 80–100)
MONO ABS: 0.6 x10 3/mm (ref 0.2–0.9)
MONOS PCT: 10 %
NEUTROS ABS: 3.9 x10 3/mm (ref 1.4–6.5)
Neutrophil %: 62.9 %
Platelet: 256 x10 3/mm (ref 150–440)
RBC: 3.79 10*6/uL — ABNORMAL LOW (ref 3.80–5.20)
RDW: 15.5 % — ABNORMAL HIGH (ref 11.5–14.5)
WBC: 6.3 x10 3/mm (ref 3.6–11.0)

## 2014-10-14 LAB — LACTATE DEHYDROGENASE: LDH: 216 U/L (ref 81–246)

## 2014-10-14 LAB — FERRITIN: Ferritin (ARMC): 8 ng/mL (ref 8–388)

## 2014-10-14 LAB — FOLATE: FOLIC ACID: 93.7 ng/mL — AB (ref 3.1–17.5)

## 2014-10-18 ENCOUNTER — Ambulatory Visit: Payer: Self-pay | Admitting: Oncology

## 2014-10-28 ENCOUNTER — Ambulatory Visit: Payer: Self-pay | Admitting: Urology

## 2014-10-31 LAB — CBC CANCER CENTER
Basophil #: 0 x10 3/mm (ref 0.0–0.1)
Basophil %: 0.5 %
EOS PCT: 5.9 %
Eosinophil #: 0.3 x10 3/mm (ref 0.0–0.7)
HCT: 32.3 % — ABNORMAL LOW (ref 35.0–47.0)
HGB: 10.1 g/dL — AB (ref 12.0–16.0)
LYMPHS ABS: 1.4 x10 3/mm (ref 1.0–3.6)
LYMPHS PCT: 26.4 %
MCH: 25.1 pg — AB (ref 26.0–34.0)
MCHC: 31.2 g/dL — ABNORMAL LOW (ref 32.0–36.0)
MCV: 80 fL (ref 80–100)
MONO ABS: 0.7 x10 3/mm (ref 0.2–0.9)
Monocyte %: 12.6 %
NEUTROS ABS: 2.8 x10 3/mm (ref 1.4–6.5)
Neutrophil %: 54.6 %
Platelet: 254 x10 3/mm (ref 150–440)
RBC: 4.03 10*6/uL (ref 3.80–5.20)
RDW: 17.4 % — ABNORMAL HIGH (ref 11.5–14.5)
WBC: 5.2 x10 3/mm (ref 3.6–11.0)

## 2014-10-31 LAB — IRON AND TIBC
IRON SATURATION: 25 %
Iron Bind.Cap.(Total): 403 ug/dL (ref 250–450)
Iron: 100 ug/dL (ref 50–170)
UNBOUND IRON-BIND. CAP.: 303 ug/dL

## 2014-10-31 LAB — FERRITIN: FERRITIN (ARMC): 160 ng/mL (ref 8–388)

## 2014-11-04 ENCOUNTER — Ambulatory Visit: Payer: Self-pay | Admitting: Surgery

## 2014-11-18 ENCOUNTER — Ambulatory Visit: Payer: Self-pay | Admitting: Oncology

## 2014-12-17 ENCOUNTER — Ambulatory Visit
Admit: 2014-12-17 | Disposition: A | Discharge status: Home / Self Care | Payer: Self-pay | Attending: Oncology | Admitting: Oncology

## 2015-02-04 NOTE — Consult Note (Signed)
PATIENT NAME:  Carolyn Valdez, Carolyn Valdez MR#:  [id number] DATE OF BIRTH:  10-Jul-1941  DATE OF CONSULTATION:  07/12/2012  REFERRING PHYSICIAN:  Dr Manuella Ghazi CONSULTING PHYSICIAN:  Heinz Knuckles. Lita Flynn, MD  REASON FOR CONSULTATION: Urinary tract infection.   HISTORY OF PRESENT ILLNESS: The patient is a 74 year old white female with a past history significant for urinary incontinence with bladder spasm and recurrent urinary tract infections and diabetes who was admitted yesterday with worsening urinary tract symptoms. The patient has been seen by me several times in the office setting, most recently on 07/07/2012. She had been seen on 06/30/2012 with symptoms of a urinary tract infection for about a week. She had burning and moderate pain. There was no fever or urgency at that time. She had dropped a urine off at the office on 06/26/2012 and a urine dip demonstrated 2+ leukocytes, negative nitrites. Formal urinalysis showed 3+ leukocyte esterase with positive nitrites, and a culture grew greater than 100,000 CFU/mL of ESBL producing Escherichia coli and greater than 100,000 CFU/mL of a moderately sensitive serratia species. At her visit on 06/30/2012 she was given ciprofloxacin with the hope of overwhelming the ESBL producing organism. She returned on 07/07/2012 to the office with improvement in her dysuria, but significant increase in her urinary frequency. In the office a urine dip showed 1+ leukocytes and positive nitrites. A repeat culture was obtained with the urinalysis. The urinalysis at that time showed 2+ leukocyte esterase, positive nitrites; and the culture again grew the resistant Escherichia coli. She was complaining of worsening urinary symptoms and we had made arrangements to have a PICC line placed and start IV therapy; however, she had worsening of her symptoms to such an extent that she went to the emergency room. In the emergency room, they gave her a dose of ceftriaxone and sent her home. She called me again  last night saying her symptoms were significantly worse again and went back to the emergency room which subsequently led to her hospitalization.   She currently states that she is having dysuria. She is also having significant frequency every 20 minutes. She has not had fevers, chills, or sweats. She has no back pain. She has no nausea or vomiting. She has no significant abdominal pain. Last night in addition to her urinary symptoms she also was concerned that she might be having an allergic reaction to the ceftriaxone. She apparently had been given this in her primary care doctor's office in the past and had some neurologic symptoms. It somewhat difficult to pin her down on whether she was actually having similar symptoms or if it was just a worsening urinary symptoms that made her concerned. She was admitted to the hospital and started on Levaquin. A urinalysis shows no significant inflammation currently.   ALLERGIES: Morphine, prednisone, sulfa drugs, and possibly ceftriaxone, fluconazole, H1N1 influenza vaccine, and Pyridium.   PAST MEDICAL HISTORY:  1. Breast cancer 1996.  2. Congestive heart failure.  3. Hypertension.  4. Diabetes.  5. Urinary incontinence with bladder spasm.  6. Allergic rhinitis.  7. Recurrent urinary tract infections.   FAMILY HISTORY: Negative for diabetes. Positive for lung cancer and hypertension.   SOCIAL HISTORY: The patient is a prior smoker. She lives alone. She has a dog at home. She does not drink alcohol.   REVIEW OF SYSTEMS: GENERAL: No fevers, chills, or sweats. Positive malaise. HEENT: No headaches, no sinus congestion. No sore throat. NECK: No stiffness, no swollen glands. RESPIRATORY: No cough, no shortness of breath, no  sputum production. CARDIAC: No chest pains or palpitations. GI: No nausea, no vomiting, no abdominal pain, no change in her bowels. GENITOURINARY: Positive urinary frequency every 20 minutes with dysuria, some incomplete emptying symptoms,  no hesitancy. No hematuria. No flank pain. MUSCULOSKELETAL: No complaints. SKIN: No rashes. NEUROLOGIC: No focal confusion or focal weakness. PSYCHIATRIC: No complaints. All other systems are negative.   PHYSICAL EXAMINATION:  VITAL SIGNS: T-max of 98.8, T-current of 98.8, pulse 71, blood pressure 152/77, 95% on room air.   GENERAL: Obese 74 year old white female in no acute distress.   HEENT: Normocephalic, atraumatic. Pupils equal, reactive to light. Extraocular motion intact. Sclerae, conjunctivae, and lids are without evidence for emboli or petechiae. Lips and gums are in good condition.   NECK: Midline trachea. No lymphadenopathy. No thyromegaly.   LUNGS: Clear to auscultation bilaterally with good air movement. No focal consolidation.   HEART: Regular rate and rhythm without murmur, rub, or gallop.   ABDOMEN: Soft, nontender, nondistended. No hepatosplenomegaly. No hernia is noted.   EXTREMITIES: No evidence for tenosynovitis.   SKIN: No rashes. No stigmata of endocarditis, specifically no Janeway lesions or Osler nodes.   NEUROLOGIC: The patient is awake and interactive, moving all 4 extremities.   PSYCHIATRIC: Mood and affect appeared normal.   LABORATORY DATA: BUN of 17, creatinine 0.71, sodium of 129, bicarbonate of 28, anion gap of 8.0. LFTs are unremarkable. White count of 9.5 with a hemoglobin 9.8, platelet count of 280,000. Urinalysis had 100 mg/dL protein, trace leukocyte esterase, negative nitrites, less than 1 red cell, 18 white cells per high powered field. There is no culture data to review. A chest x-ray from admission showed cardiomegaly and possibly underlying fibrosis or interstitial edema.   IMPRESSION: A 74 year old white female with a history of urinary incontinence and recurrent urinary tract infections and diabetes who was admitted with worsening dysuria and urinary frequency.   RECOMMENDATIONS:  1. In the office on 07/07/2012 she had a positive urinalysis  with a urine culture that grew greater than 100,000 CFU/mL of ESBL producing Escherichia coli. She was scheduled to get a PICC line placed and start IV therapy, but she went to the emergency room due to worsening symptoms and was given ceftriaxone IV x1 with a plan to come to my office today and get a PICC line and then have 2 weeks of IV therapy. She returned to the ER again last night as she was concerned over possible allergic reaction to ceftriaxone. Her urinary frequency and dysuria persists. 2. Her urinalysis looks good in house but she continues to have symptoms. She may be partially treated with a dose of ceftriaxone although this should not have activity against ESBL producing Escherichia coli. I am surprised that her symptoms are still so bad without evidence for infection. She does have a history of bladder spasms as well.  3. As her most recent cultures showed resistance to all oral therapy, we will change her to gentamicin. We will give a dose today and then tomorrow. It may be possible to give a shorter course of gentamicin, then ertapenem which would be needed for the ESBL producing Escherichia coli. If she is clinically better then we can consider stopping and have her return to my office in a week to see how she is doing.  4. If she has not improved we will need to consider urology consult for possible noninfectious etiologies to her symptoms.  5. She is not getting her Myrbetriq.  We will add this  to her regimen.   This is a moderately complex infectious disease consult. Thank you very much for involving me in Ms. Farve's care.      ____________________________ Heinz Knuckles. Nosson Wender, MD meb:vtd D: 07/12/2012 14:52:46 ET T: 07/12/2012 15:38:31 ET JOB#: [id number]  cc: Heinz Knuckles. Lilyth Lawyer, MD, <Dictator> Christyne Mccain E Covey Baller MD ELECTRONICALLY SIGNED 07/17/2012 8:03

## 2015-02-04 NOTE — Discharge Summary (Signed)
PATIENT NAME:  Carolyn Valdez, Carolyn Valdez MR#:  [id number] DATE OF BIRTH:  November 23, 1940  DATE OF ADMISSION:  07/12/2012 DATE OF DISCHARGE:  07/14/2012   DISCHARGE DIAGNOSES:  1. Extended-spectrum beta-lactamase producing Escherichia coli urinary tract infection, much improved. Outpatient follow-up with Dr. Clayborn Bigness.  2. Hyponatremia, improved with IV fluids.   SECONDARY DIAGNOSES:  1. Hypertension.  2. Type 2 diabetes.  3. Obesity. 4. Hyperlipidemia.  5. History of breast cancer.   CONSULTANT: Lanae Boast, MD - Infectious Disease.   PROCEDURES/RADIOLOGY: Chest x-ray on 07/11/2012 showed no acute cardiopulmonary disease, mild interstitial edema.   MAJOR LABORATORY PANEL: Urinalysis on admission showed 3+ bacteria, 18 WBCs, and trace leukocyte esterase.   Urine culture could not performed due to inadequate sample.   HISTORY AND SHORT HOSPITAL COURSE: The patient is a 74 year old female with the above-mentioned medical problems who was admitted for recurrent, persistent urinary tract infection along with hyponatremia. She was started on IV Rocephin and infectious disease consultation was obtained with Dr. Clayborn Bigness. Please see Dr. Zacarias Pontes dictated history and physical for further details. She was growing ESBL producing Escherichia coli and Dr. Clayborn Bigness recommended changing her antibiotic to gentamicin, which was done. She was doing much better on 07/14/2012 and was discharged home in stable condition. She did have some hyponatremia and was improving with hydration and she had a normal sodium.  DISCHARGE PHYSICAL EXAMINATION:  VITALS: On the date of discharge, her temperature was 97.8, heart rate 65 per minute, respirations 20 per minute, blood pressure 170/62 mmHg, and she was saturating 96% on room air.   CARDIOVASCULAR: S1 and S2 normal. No murmurs, rubs, or gallops.   PULMONARY: Lungs are clear to auscultation bilaterally. No wheezes, rales, rhonchi, or crepitation.   ABDOMEN: Soft, benign.    NEUROLOGIC: Nonfocal examination. All other physical examination remained at baseline.   DISCHARGE MEDICATIONS:  1. VESIcare 10 mg p.o. daily. 2. Myrbetriq 50 mg p.o. daily.  3. Colace 100 mg two capsules p.o. twice a day. 4. Klonopin 1 mg p.o. three times daily. 5. Metoprolol 50 mg p.o. every morning and 2 tablets every evening.  6. Neurontin 300 mg p.o. three times daily.  7. Prilosec 40 mg p.o. twice a day.  8. Allegra 1 tablet p.o. daily.  9. Lipitor 20 mg p.o. daily.  10. Hydroxyzine 25 mg p.o. every evening. 11. Evista 60 mg p.o. every evening.  12. Actos 30 mg p.o. daily. 13. Cozaar 50 mg p.o. daily. 14. Relafen 500 mg p.o. twice a day. 15. Glucophage 1000 mg p.o. twice a day. 16. Glucophage 500 mg 1 tablet at lunch.  17. Glucotrol 10 mg two tablets p.o. every morning.  18. DDAVP 0.2 mg p.o. every evening. 19. Aspirin 81 mg p.o. daily. 20. Vitamin E one capsule p.o. daily. 21. Ferrous sulfate 325 mg p.o. daily. 22. Insulin NovoLog subcutaneous as needed, per sliding scale. 23. Insulin Lantus 50 units subcutaneous at bedtime.   DISCHARGE DIET: Low sodium, 1800 ADA.   DISCHARGE ACTIVITY: As tolerated.   DISCHARGE INSTRUCTIONS AND FOLLOW-UP: The patient was instructed to follow-up with her primary care physician, Dr. Kem Kays, in 1 to 2 weeks. She will need follow-up with Dr. Legrand Como Blocker in 1 to 2 weeks.   TOTAL DISCHARGE TIME: 55 minutes.  ____________________________ Lucina Mellow. Manuella Ghazi, MD vss:slb D: 07/18/2012 00:08:12 ET T: 07/18/2012 10:52:10 ET JOB#: [id number]  cc: Tiearra Colwell S. Manuella Ghazi, MD, <Dictator> Lorenza Evangelist, MD [place] Blocker, MD Remer Macho MD ELECTRONICALLY SIGNED 07/18/2012 13:39

## 2015-02-04 NOTE — H&P (Signed)
PATIENT NAME:  Carolyn Valdez, REPA MR#:  [id number] DATE OF BIRTH:  1941-07-20  DATE OF ADMISSION:  07/12/2012  PRIMARY CARE PHYSICIAN: Lanae Boast, MD   CHIEF COMPLAINT: Shaking, frequency of urination, low-grade fever, nausea.   HISTORY OF PRESENT ILLNESS: Carolyn Valdez is a 74 year old Caucasian female with history of multiple medical problems. Currently she is following up with Dr. Clayborn Bigness who had treated her for a urinary tract infection, however, her symptoms have persisted. She came to the Emergency Department yesterday, received a dose of Rocephin, and to follow-up the following day with Dr. Clayborn Bigness. However, upon going home she states that she had shaking chills, low-grade fever, and frequency of urination that she had to go to the bathroom every five minutes. She has nausea but no vomiting. She also reported diarrhea yesterday but this did not persist. She called her primary care physician and directed her to come to the Emergency Department for possibility of underlying resistant bacteria. The patient was admitted for further evaluation and treatment.   REVIEW OF SYSTEMS: CONSTITUTIONAL: She reports low-grade fever earlier, chills and mild fatigue. EYES: No blurring of vision. No double vision. ENT: No hearing impairment. No sore throat. No dysphagia. CARDIOVASCULAR: No chest pain. No shortness of breath. No syncope. RESPIRATORY: No cough. No sputum production. No chest pain. No shortness of breath. GASTROINTESTINAL: No abdominal pain but reports nausea. She had diarrhea yesterday, several bouts, but none in the last several hours. GENITOURINARY: Reports frequency of urination every five minutes. Mild dysuria. MUSCULOSKELETAL: No joint pain or swelling. No muscular pain or swelling. INTEGUMENTARY: No skin rash. No ulcers. NEUROLOGY: No focal weakness. No seizure activity. No headache. PSYCHIATRY: No anxiety or depression currently but she has history of anxiety. ENDOCRINE: No polyuria or polydipsia other  than the frequency of urination, sometimes only a little urine. No heat or cold intolerance.   PAST MEDICAL HISTORY:  1. Systemic hypertension. 2. Diabetes mellitus, type II. 3. Obesity. 4. Hyperlipidemia. 5. History of breast cancer, status post right mastectomy.   PAST SURGICAL HISTORY:  1. Right mastectomy. 2. Cholecystectomy. 3. Right knee replacement. 4. Left knee arthroscopy. 5. Rotator cuff surgery.   SOCIAL HABITS: Nonsmoker. No history of alcohol or drug abuse.   SOCIAL HISTORY: She is widowed. Lives at home alone.   FAMILY HISTORY: Her father died from lung cancer. Her mother suffers from hypertension.   ADMISSION MEDICATIONS:  1. Actos 30 mg a day. 2. Allegra 1 tablet once a day, dose not specified.  3. Aspirin 81 mg a day. 4. Cozaar 50 mg once a day.  5. DDAVP 0.2 mg once a day in the evening. 6. Colace 100 mg 2 capsules twice a day. 7. Evista 60 mg once a day.  8. Ferrous sulfate or iron sulfate 325 mg once a day. 9. Glucophage 1000 mg in the morning and at night and 500 mg at lunch.  10. Glucotrol 10 mg taking 2 tablets in the morning, that is 20 mg in the morning.  11. Hydroxyzine 25 mg in the evening. 12. Klonopin 1 mg 3 times a day.  13. Lantus 50 units at bedtime.  14. NovoLog sliding scale.  15. Lipitor 20 mg in the evening. 16. Metoprolol 50 mg in the morning and 100 mg in the evening.  17. Neurontin 300 mg 3 times a day.  18. Prilosec 40 mg twice a day.  19. Relafen 500 mg twice a day. 20. VESIcare 10 mg once a day. 21. Vitamin E 1 capsule  once a day.   ALLERGIES: Morphine, prednisone, sulfa, latex.   PHYSICAL EXAMINATION:   VITAL SIGNS: Blood pressure 173/75, respiratory rate 18, pulse 87, temperature 99.6, oxygen saturation 93%.   GENERAL APPEARANCE: Elderly female laying in bed in no acute distress, morbidly obese.   HEAD: Mild pallor. No icterus. No cyanosis.   EARS, NOSE, AND THROAT: Hearing was normal. Nasal mucosa, lips, tongue were  normal.   EYES: Normal iris and conjunctivae. Pupils about 4 to 5 mm, equal and reactive to light.   NECK: Supple. Trachea at midline. No cervical masses.   HEART: Normal S1, S2. No S3, S4. No murmur. No gallop. No carotid bruits.   RESPIRATORY: Normal breathing pattern without use of accessory muscles. No rales. No wheezing.   ABDOMEN: Soft without tenderness. She is morbidly obese. No hepatosplenomegaly. No masses. No hernias.   SKIN: No ulcers. No subcutaneous nodules.   MUSCULOSKELETAL: No joint swelling. No clubbing.   NEUROLOGIC: Cranial nerves II through XII were intact. No focal motor deficit.   PSYCHIATRIC: The patient is alert and oriented x3. Mood and affect were normal.   LABORATORY, DIAGNOSTIC, AND RADIOLOGICAL DATA: Serum glucose 150, BUN 17, creatinine 0.7, sodium 129, potassium 4.9, serum calculated osmolality 263, calcium 8.8. Normal liver function tests. Troponin less than 0.02. CBC showed white count of 9000, hemoglobin 9.8, hematocrit 30, platelet count 280, MCV 76. Urinalysis showed cloudy urine, 18 white blood cells, +3 bacteria.   ASSESSMENT:  1. Persistent lower urinary tract infection.  2. Hyponatremia. 3. Type II diabetes mellitus, insulin dependent.  4. Systemic hypertension.  5. Hyperlipidemia.  6. Chronic anemia.  7. History of breast cancer, status post right mastectomy.  8. Anxiety disorder.  9. Overactive bladder.   PLAN:  1. Will admit the patient to the medical floor since she had failed outpatient treatment.  2. Send urine for culture and sensitivity.  3. Start Rocephin 1 gram daily.  4. Consult Dr. Clayborn Bigness for Infectious Disease input.  5. Regarding her hyponatremia, there are many etiologies for that. For the time being I will send urine for osmolality. I will hold Actos.  6. Place her on Accu-Cheks and sliding scale.  7. Continue Lantus and glipizide.   The patient indicates that she has a LIVING WILL. She tells me that she does not want  to have life support if her condition was incurable, however, short-term resuscitation and life support she is agreeable to.   TIME SPENT EVALUATING THIS PATIENT: More than 55 minutes.   ____________________________ Clovis Pu. Lenore Manner, MD amd:drc D: 07/12/2012 06:36:54 ET T: 07/12/2012 11:30:07 ET JOB#: [id number]  cc: Clovis Pu. Lenore Manner, MD, <Dictator> [place] Blocker, MD Ellin Saba MD ELECTRONICALLY SIGNED 07/13/2012 5:31

## 2015-02-04 NOTE — Consult Note (Signed)
Impression: [phone] WF w/ h/o urinary incontinence, recurrent UTIs, DM who was admitted with worsening dyuria and urinary frequency.  In the office on 9/20, she had positive u/a with a urine culture that grew >100k CFU/ml of ESBL producing E. coli. which was resistant to fluoroquinolones, tetracycline, sulfa, first gen cephalosporins and intermediate to second gen cephalosporins.  She went to the ER due to worsening symptoms and was given ceftriaxone, IV x1 with a plan to come to  my office today and then get a PICC line and have take two weeks of IV therapy.  She returned to the ER last night as she was concerned over possible allergic reaction to the ceftriaxone.  Her urinary frequency persists. Her u/a looks good in house, but she continues to have symptoms.  She may be partially treated with the dose of ceftriaxone, but I am surprised that her symptoms are still so bad.  She has bladder spasms as well.  As her most recent cultures showed resistance to all oral therapy, will change her to gentamicin.  Will give a dose today, then tomorrow.  If she is better, then we can consider stopping and have her return to my office in a week to see how she is doing. If she is not improved, will need to consider urology consultation for possible noninfectious etiology to her symptoms. She is not getting her merbetriq.  Will add this.    Electronic Signatures: Tatiyana Foucher, Heinz Knuckles (MD) (Signed on 25-Sep-13 14:46)  Authored   Last Updated: 25-Sep-13 14:52 by Berlie Persky, Heinz Knuckles (MD)

## 2015-02-07 NOTE — Consult Note (Signed)
Brief Consult Note: Diagnosis: pt admitted with uti and urosepsis and sob. Mild troponin elevation to 0.39. No chest pain . EKG unremarkable.   Patient was seen by consultant.   Recommend further assessment or treatment.   Discussed with Attending MD.   Comments: 74 yo female with no prior cardiac history admited  with urosepsis. Noted to have serum troponin elevated to 0.39. No signficant ekg chagnes. Echo done during recent admission revealed preserved lv function. Mild weakness and fatigue. No chest pain. Appears to be secondary to demand ischemia. Agree with agressive treatment of urosepsis. Will follow symtomatically. Continue withi metoprolol.  Electronic Signatures: Teodoro Spray (MD)  (Signed [phone] 09:24)  Authored: Brief Consult Note   Last Updated: 08-Nov-14 09:24 by Teodoro Spray (MD)

## 2015-02-07 NOTE — H&P (Signed)
PATIENT NAME:  Carolyn Valdez, Carolyn Valdez MR#:  [id number] DATE OF BIRTH:  October 02, 1941  DATE OF ADMISSION:  08/23/2013  REFERRING PHYSICIAN: Dr. Beather Arbour.   PRIMARY CARE PHYSICIAN: Dr. Sabra Heck.   CHIEF COMPLAINT: Fevers, chills.   HISTORY OF PRESENT ILLNESS: This is a 74 year old Caucasian female with history of diastolic congestive heart failure, hypertension, type 2 diabetes. Recently discharged from [facility] on 08/01/2013 who is presenting with fever, chills and dysuria. She states that she had persistent symptoms of dysuria since her discharge 08/01/2013 after removal of her Foley catheter. She was diagnosed with a UTI, placed on ciprofloxacin as an outpatient with no improvement in her symptoms. In fact, they have been worsening. She has noted today 1 day's duration of fevers as well as chills and shaking with the associated dysuria and increased urinary frequency. She called her PCP who ordered her Ceftin, though given the symptoms, she was referred to the Emergency Department for further workup and evaluation. Of note, her Lasix was recently decreased from 2 tablets to 1 tablet daily. She does complain of dyspnea on exertion which is stable, lower extremity edema which is also stable and orthopnea which is slightly worsening, though she states she sleeps in her recliner now as opposed to a bed for the convenience of getting to the bathroom at night. On arrival, she was found to be hypoxemic with SaO2 of 78% on room air. She is not O2 dependent at baseline.   REVIEW OF SYSTEMS:  CONSTITUTIONAL: Positive for subjective fevers and chills. Otherwise, denies fatigue, weakness, pain.  EYES: Denies blurred vision, double vision, eye pain.  ENT: Denies ear pain, hearing loss or discharge.  RESPIRATORY: Denies cough, wheeze or shortness of breath.  CARDIOVASCULAR: Denies chest pain or palpitations. Positive for dyspnea on exertion and edema which are both stable. Denies PND. Positive for worsening orthopnea.   GASTROINTESTINAL: Denies nausea, vomiting, diarrhea, abdominal pain.  GENITOURINARY: Denies hematuria. Positive for dysuria and increased urinary frequency.  ENDOCRINE: Denies nocturia or thyroid problems.  HEMATOLOGIC AND LYMPHATIC: Denies easy bruising or bleeding.  SKIN: Denies rashes or lesions that are new. She mentions a previous right lower extremity ulcer.  MUSCULOSKELETAL: Denies pain in her neck, back, shoulders, knees or hips, any arthritic symptoms.  NEUROLOGIC: Denies paralysis or paresthesias.  PSYCHIATRIC: Denies anxiety or depressive symptoms.   Otherwise, full review of systems performed by me is negative.   PAST MEDICAL HISTORY: Hypertension, hyperlipidemia, type 2 diabetes which is insulin requiring, history of breast cancer status post mastectomy, diastolic congestive heart failure with valvular component with 2+ mitral regurgitation and tricuspid regurgitation, with left ventricular hypertrophy and posterior wall left ventricular thickness and ejection fraction of 60% to 65%.   SOCIAL HISTORY: Denies alcohol, tobacco or drug use.   FAMILY HISTORY: Positive for hypertension and lung cancer   ALLERGIES: MORPHINE, PREDNISONE, ROCEPHIN, SULFA DRUGS, LATEX AND TAPE.   HOME MEDICATIONS: Include acetaminophen 325 two tabs p.o. q.6 hours as needed for pain or temperature, Allegra 24-hour tablet 1 tab p.o. daily, aspirin 81 mg p.o. daily, Cipro 500 mg p.o. b.i.d., Cozaar 50 mg p.o. daily, Colace 100 mg 2 tabs p.o. b.i.d., Evista 60 mg p.o. daily, ferrous sulfate 325 mg p.o. daily, Lasix 40 mg p.o. daily, insulin Levemir 80 units subcutaneous daily, Klonopin 1 mg p.o. t.i.d., Lipitor 20 mg p.o. daily, metoprolol 50 mg p.o. b.i.d., Neurontin 300 mg p.o. t.i.d., nitroglycerin 0.4 mg sublingual tablet p.o. as needed for chest pain, potassium 10 mEq p.o. daily, Prilosec  40 mg p.o., b.i.d.   PHYSICAL EXAMINATION:  VITAL SIGNS: Temperature 100.1 degrees Fahrenheit, heart rate 97,  respirations 22, blood pressure 157/57, saturating 93% on 4 liters nasal cannula, saturating 78% on room air on arrival, weight 148.8 kg.  GENERAL: Obese, well-developed, Caucasian female, currently in no acute distress.  HEAD: Normocephalic, atraumatic.  EYES: Pupils equal, round and reactive to light. Extraocular muscles intact. No scleral icterus.  MOUTH: Moist mucous membranes. Dentition intact. No abscesses noted.  EARS, NOSE, THROAT: Throat clear without exudate. No external lesions.  NECK: Supple. No thyromegaly. No nodules. Difficult to assess JVD secondary to body habitus.  PULMONARY: Clear to auscultation bilaterally without wheezes, rubs or rhonchi; however, there are diminished breath sounds at the bases bilaterally. No use of accessory muscles. Good respiratory effort.  CHEST: Nontender to palpation.  CARDIOVASCULAR: S1 and S2. There is a 3/6 systolic ejection murmur best heard at right sternal border. Has 2+ pedal edema. Pedal pulses 2+ bilaterally.  GASTROINTESTINAL: Soft, nontender, nondistended. No masses. Obese. Positive bowel sounds. No hepatosplenomegaly.  MUSCULOSKELETAL: No swelling or clubbing. Edema as above. Range of motion full in all extremities.  NEUROLOGIC: Cranial nerves II through XII intact. No gross focal neurological deficits. Sensation intact. Reflexes intact.  SKIN: There is an ulceration secondary to weeping edema of the right lower extremity. No other lesions, rashes or cyanosis. Skin is warm and dry. Turgor is intact.  PSYCHIATRIC: Mood and affect within normal limits. Awake, alert, oriented x 3. Insight and judgment are intact.   LABORATORY DATA: Sodium 136, potassium 4.5, chloride 102, bicarb 28, BUN 24, creatinine 1.28, BNP 2083, glucose 212. LFTs: Albumin 3.3, otherwise within normal limits. CK 71, CK-MB 0.7, troponin I 0.16. WBC 7.8, hemoglobin 10.7, platelets of 200. Urinalysis: Leukocyte esterase positive, nitrite positive, WBCs 273, RBCs 10, epithelial  cells less than 1. ABG performed, 7.42, 43, 77, 27.9 on nasal cannula with FiO2 of 36. EKG: Normal sinus rhythm. No changes from prior EKG. Chest x-ray revealing cardiomegaly, left pleural effusion and increased pulmonary vasculature consistent with edema.   ASSESSMENT AND PLAN: A 74 year old Caucasian female with diastolic congestive heart failure presenting with fever, chills, dysuria, found to be hypoxemic on arrival.  1. Urinary tract infection: Failed outpatient treatment. Urinalysis obtained. Will follow culture. She has failed Cipro treatment. Will start ceftriaxone. Has a known allergy to ceftriaxone which causes anxiety. Will proceed with this antibiotic knowning this allergy.  2. Hypoxemia in the setting of diastolic congestive heart failure: Lasix dosed intravenous. Will trend cardiac enzymes. The patient recently had a transthoracic echocardiogram with normal ejection fraction, though valvular dysfunction.  3. Acute on chronic diastolic congestive heart failure: Intravenous Lasix. Continue losartan and metoprolol.  4. Type 2 diabetes, insulin dependent: Continue Levemir. Add insulin sliding scale.  5. Hyperlipidemia: Continue Lipitor.  6. Venous thromboembolism prophylaxis with heparin subcutaneous.   The patient is FULL CODE.   TIME SPENT: 45 minutes.   ____________________________ Aaron Mose. Hower, MD dkh:gb D: 08/24/2013 02:39:13 ET T: 08/24/2013 02:56:04 ET JOB#: [id number]  cc: Aaron Mose. Hower, MD, <Dictator> DAVID Woodfin Ganja MD ELECTRONICALLY SIGNED 08/24/2013 3:58

## 2015-02-07 NOTE — H&P (Signed)
PATIENT NAME:  Carolyn Valdez, Carolyn Valdez MR#:  [id number] DATE OF BIRTH:  June 27, 1941  DATE OF ADMISSION:  07/24/2013  PRIMARY CARE PHYSICIAN: Dr. Kem Kays.   REFERRING PHYSICIAN:  Dr. Lavonia Drafts.   CHIEF COMPLAINT: Shortness of breath.    HISTORY OF PRESENT ILLNESS: The patient is a 74 year old Caucasian female with a past medical history of hypertension, diabetes mellitus, hyperlipidemia and obesity. She is presenting to the ER with a chief complaint of a 1-week history of shortness of breath. The patient is reporting that for the past 1 week she has been experiencing dyspnea on exertion and eventually it has gotten worse for the past 2 days. She also gained significant weight in the past 1 week. Denies any chest pain, abdominal pain, nausea, or vomiting. In the ER, chest x-ray has revealed congestive heart failure with borderline cardiomegaly and diffuse interstitial edema. The patient was given Lasix, and hospitalist team is called to admit the patient. During my examination, the patient is reporting that she is urinating frequently but denies any chest pain.   PAST MEDICAL HISTORY: Significant for hypertension, obesity, hyperlipidemia, history of breast cancer, status post right-sided mastectomy and diabetes mellitus type 2, insulin-dependent.   PAST SURGICAL HISTORY: Right-sided mastectomy, cholecystectomy, right knee replacement and left knee arthroscopic surgery.   PSYCHOSOCIAL HISTORY: She is widowed and lives alone. Denies any history of smoking, alcohol or illicit drug usage.   FAMILY HISTORY: Father deceased from lung cancer. Mother has hypertension.   ALLERGIES: THE PATIENT IS ALLERGIC TO MORPHINE, PENICILLIN, SULFA, LATEX.   HOME MEDICATIONS: Relafen 500 mg 2 times a day, Prevacid 40 mg 2 times a day, Neurontin 300 mg 3 times a day, metoprolol tartrate 50 mg 1 tablet in the morning and 2 tablets every evening, Lipitor 20 mg at bedtime, Lantus 50 units subcutaneous once daily, hydroxyzine  1 tablet p.o. once a day, Glucophage 500 mg once daily, Glucophage 1000 mg 2 times a day, Evista 60 mg once daily, Cozaar 50 mg once a day, aspirin 81 mg once daily, Allegra 24-hour allergy tablet once daily, Actos 30 mg once a day.   REVIEW OF SYSTEMS:  CONSTITUTIONAL: Denies any fever or fatigue.  EYES: Denies blurry vision, glaucoma.  ENT: Denies epistaxis, discharge.  RESPIRATION: Denies cough or COPD.  Complaining of dyspnea.  CARDIOVASCULAR: Denies chest pain, palpitations or syncope.  GASTROINTESTINAL: Denies nausea, vomiting, diarrhea.  GENITOURINARY: No dysuria, hematuria.  GYNECOLOGIC AND BREAST: Denies and breast mass. Status post right-sided mastectomy secondary to breast cancer. Denies any vaginal discharge.  ENDOCRINE: Denies polyuria, nocturia. No thyroid problems.  HEMATOLOGIC AND LYMPHATIC: Denies anemia, bruising, bleeding.  INTEGUMENTARY: No acne, rash, lesions.  MUSCULOSKELETAL: Denies any pain in the neck, back, shoulder.  NEUROLOGIC: Denies vertigo, ataxia.  PSYCHIATRIC: Denies ADD, OCD.   PHYSICAL EXAMINATION:  VITAL SIGNS: Temperature 97.5, pulse 76, respirations 18, blood pressure 162/67, pulse oximetry 92%.  GENERAL APPEARANCE: Not in acute distress. Moderately built and obese.  HEENT: Normocephalic, atraumatic. Pupils are equally reacting to light and accommodation. No scleral icterus. No conjunctival injection. No sinus tenderness. Moist mucous membranes.  NECK: Supple. Positive JVD. Range of motion is intact.  LUNGS: Positive rales and rhonchi. Moderate air entry. No anterior chest wall tenderness on palpation.  CARDIAC: S1, S2 normal. Regular rate and rhythm.  GASTROINTESTINAL: Soft, obese. Bowel sounds are positive in all four quadrants. Nontender, nondistended. No masses felt. No hepatosplenomegaly.  NEUROLOGIC:  Alert, awake and oriented x3. Motor and sensory are grossly intact. Reflexes  are 2+.  EXTREMITIES: Pitting 2+ edema is present.  SKIN: Warm to  touch. Normal turgor. No rashes. No lesions.  MUSCULOSKELETAL: No joint tenderness. No erythema.  PSYCHIATRIC: Normal mood and affect.   LABORATORY AND IMAGING STUDIES: Glucose 175, BUN 19, creatinine 0.88, sodium 133, potassium 4.2, chloride 98, CO2 32. GFR greater than 60. Anion gap is 3. Serum osmolality 273. Calcium 9.7. LFTs are normal. Troponin less than 0.02. WBC 7.0, hemoglobin 11.9, hematocrit 35.3, platelets 262. Chest x-ray, PA and lateral: Findings suggestive of congestive heart failure, with borderline to mild cardiomegaly and diffuse interstitial edema. Underlying fibrotic changes are present.  12-lead EKG: Normal sinus rhythm at 65 beats per minute, normal PR and QRS intervals, nonspecific ST-T wave changes.   ASSESSMENT AND PLAN: A 74 year old Caucasian female brought into the ER for a 1-week history of progressively worsening of shortness of breath associated with lower extremity edema, will be admitted with the following assessment and plan.   1.  New onset congestive heart failure. Will admit her to telemetry. Cycle cardiac biomarkers.  2.  Will obtain echocardiogram to evaluate left ventricular ejection fraction and valvular abnormalities.  3.  Will provide her aspirin, beta blocker, statin.  4.  Lasix 40 mg intravenous q.12 hours, and monitor renal function closely.  5.  Cardiology consult is placed to Dr. Nehemiah Massed.  6.  We will hold off on Actos, which could worsen congestive heart failure.  7.  Diabetes mellitus. Continue metformin and the patient's home medication Lantus.  8.  Hypertension. Resume home medications.  9.  Hyperlipidemia. Check fasting lipid panel and continue statin.  10.  We will provide gastrointestinal and deep vein thrombosis prophylaxis.   CODE STATUS: She is full code. Sister is the medical power of attorney.   Diagnosis and plan of care was discussed in detail with the patient.   Total time spent on admission is 45 minutes.  The patient will be  transferred to [facility] in a.m.   ____________________________ Nicholes Mango, MD ag:cg D: 07/24/2013 01:05:00 ET T: 07/24/2013 02:00:03 ET JOB#: [id number]  cc: Lorenza Evangelist, MD Nicholes Mango, MD, <Dictator>   Nicholes Mango MD ELECTRONICALLY SIGNED 07/28/2013 11:33

## 2015-02-07 NOTE — Consult Note (Signed)
PATIENT NAME:  Carolyn Valdez, Carolyn Valdez MR#:  [id number] DATE OF BIRTH:  [birth year]  DATE OF CONSULTATION:  07/24/2013  REFERRING PHYSICIAN:  Theodoro Grist, MD CONSULTING PHYSICIAN:  Corey Skains, MD  REASON FOR CONSULTATION: Acute-on-chronic diastolic dysfunction, congestive heart failure, diabetes, hypertension, hyperlipidemia, and supraventricular tachycardia.   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 The patient has had a history of diastolic dysfunction and left ventricular hypertrophy, of which the patient has had new onset of significant lower extremity edema and pulmonary edema with significant increase in shortness of breath over a several-week period culminating in seen in the Emergency Room with some pulmonary edema by chest x-ray.   The patient has had no apparent significant new chest discomfort or elevated troponin or any current concerns of acute myocardial infarction. The patient has had significant improvement in  symptoms since stasrtin with intravenous Lasix. The patient now has less hypoxia.   REVIEW OF SYSTEMS: The remainder of the review of systems negative for vision change, ringing in the ears, hearing loss, cough, congestion, heartburn, nausea, vomiting, diarrhea, bloody stools, stomach pain, extremity pain, leg weakness, cramping buttocks, known blood clots, headaches, blackouts, dizzy spells, nosebleeds, congestion, trouble swallowing, frequent urination, urination at night, muscle weakness, numbness, anxiety, depression, skin lesions, or skin rashes.   PAST MEDICAL HISTORY:  1.  Diastolic dysfunction.  2.  Hypertension.  3.  Hyperlipidemia.  4.  Diabetes.     FAMILY HISTORY: No family members with early onset of cardiovascular disease or hypertension.    SOCIAL HISTORY: Currently denies alcohol or tobacco use.   ALLERGIES: As listed.   MEDICATIONS: As listed.   PHYSICAL EXAMINATION:  VITAL SIGNS: Blood pressure 136/68 bilaterally. Heart rate is 60 upright, reclining, and regular.  GENERAL: A well-appearing female in no acute distress.  HEENT: No icterus, thyromegaly, ulcers, hemorrhages, or xanthelasma.  CARDIOVASCULAR: Regular rate and rhythm. Normal S1 and S2. Distant heart sounds. PMI is diffuse. Carotid upstroke normal without bruit. Jugular venous pressure is slightly elevated.  LUNGS: Bibasilar crackles with decreased breath sounds in the bases.  ABDOMEN: Soft, nontender with some possible hepatosplenomegaly or masses. Increased abdominal girth.  EXTREMITIES: Show 2+ radial, femoral and trace dorsal pedal pulses with 1 to 2+ lower extremity edema. No cyanosis, clubbing or ulcers.  NEUROLOGIC: Oriented to time, place, and person with normal mood and affect.   ASSESSMENT: A 74 year old female with acute-on-chronic diastolic dysfunction, congestive heart failure with pulmonary edema, hypoxia, hypertension, hyperlipidemia, diabetes, without current evidence of myocardial infarction.   RECOMMENDATIONS:  1.  Intravenous Lasix for edema.  2.  Echocardiogram for further evaluation of extensive LV dysfunction and/or diastolic dysfunction with valvular heart disease.  3.  Continue metoprolol for history of supraventricular tachycardia and possible diastolic dysfunction.  4.  Consider ACE inhibitor for renal protection and diabetes and hypertension control.  5.  Ambulate and follow for other significant symptoms and further consideration of cath depending on evidence of echocardiographic changes.  6.  Further treatment options after above.    ____________________________ Corey Skains, MD bjk:np D: 07/24/2013 18:14:51 ET T: 07/24/2013 18:37:34 ET JOB#: [id number]  cc: Corey Skains, MD, <Dictator> Corey Skains  MD ELECTRONICALLY SIGNED 07/31/2013 8:14

## 2015-02-07 NOTE — Discharge Summary (Signed)
Dates of Admission and Diagnosis:  Date of Admission 24-Jul-2013   Date of Discharge 01-Aug-2013   Admitting Diagnosis CHF   Final Diagnosis CHF, HHD, DM, UTI, CRI   Discharge Diagnosis 1 CHF   2 DM   3 UTI   4 CRI    Chief Complaint/History of Present Illness 74 year old lady with h/o Hypertension, diabetes, hyperlipidemia and obesity. Patient presented with c/o shortness of breath and dyspnea on exertion for one week associated with weight gain and lower leg edema. Initial CXR was consistent with congestive heart failure and BNP was over 1000. She was admitted for management of CHF.   Routine Chem:  15-Oct-14 04:35   Glucose, Serum  246  BUN  42  Creatinine (comp)  1.76  Sodium, Serum  134  Potassium, Serum 4.1  Chloride, Serum  95  CO2, Serum  34  Calcium (Total), Serum 9.3  Anion Gap  5  Osmolality (calc) 287  eGFR (African American)  33  eGFR (Non-African American)  29 (eGFR values <21m/min/1.73 m2 may be an indication of chronic kidney disease (CKD). Calculated eGFR is useful in patients with stable renal function. The eGFR calculation will not be reliable in acutely ill patients when serum creatinine is changing rapidly. It is not useful in  patients on dialysis. The eGFR calculation may not be applicable to patients at the low and high extremes of body sizes, pregnant women, and vegetarians.)   PERTINENT RADIOLOGY STUDIES: XRay:    06-Oct-14 18:33, Chest PA and Lateral  Chest PA and Lateral   REASON FOR EXAM:    SOB  COMMENTS:       PROCEDURE: DXR - DXR CHEST PA (OR AP) AND LATERAL  - Jul 23 2013  6:33PM     RESULT: Comparison is made to the examination of 07/11/2012. There is   pulmonary vascular congestion with interstitial edema. Borderline   cardiomegaly is present area there is no effusion, consolidation or   pneumothorax. The bony and mediastinal structures are unremarkable aside   from atherosclerotic calcification. Apical fibrotic changes are  present   bilaterally.    IMPRESSION:   1. Findings suggestive of congestive heart failure with borderline to   mild cardiomegaly and diffuse interstitial edema. There is underlying     fibrotic changes well.    Dictation Site: 6        Verified By: GSundra Aland M.D., MD    13-Oct-14 14:51, Chest PA and Lateral  Chest PA and Lateral   REASON FOR EXAM:    chf  COMMENTS:       PROCEDURE: DXR - DXR CHEST PA (OR AP) AND LATERAL  - Jul 30 2013  2:51PM     RESULT:     Comparison is made to a prior study dated 07/23/2013.    The patient has taken a shallow inspiration. With technique taken into   consideration, there is no evidence of focal infiltrates, effusions or   edema. The cardiac silhouette is enlarged. The visualized bony skeleton   is unremarkable.    IMPRESSION:  Shallow inspiration without evidence of acute     cardiopulmonary disease.        Thank you for this opportunity to contribute to the care of your patient.       Verified By: HMikki Santee M.D., MD  LabUnknown:    06-Oct-14 18:33, Chest PA and Lateral  PACS Image    Hospital Course:  Hospital Course Patient was diuresed  with i.v Lasix and continued on beta-blocker Metoprolol. Serial cardiac enzymes were negative. ECHO revealed moderate LVH and EF of 60-65% coonsistent with hypertensive heart disease. Metoprolol was increased to 50 mg Q 12 hours and Cozaar 50 mg daily added for optimal blood pressure control. Actos was held for CHF. CHF improved and BNP decreased to 233 on 07/30/13. She was gradually weaned off supplemental oxygen. Patient was followed by physical therapy during her hospital stay. Creatinine increased to 1.76 after aggressive diuresis for management of CHF. Shall hold Metformin. Levemir was increased to 80 units at night. Blood sugars was between 200-250 today morning. Patient will be discharged home today. Repeat BMP in 2-3 days prior to follow up in the office.   Condition on  Discharge Stable   DISCHARGE INSTRUCTIONS HOME MEDS:  Medication Reconciliation: Patient's Home Medications at Discharge:     Medication Instructions  neurontin 300 mg oral capsule  1 cap(s) orally 3 times a day (morning, lunch, evening)   allegra 24 hour allergy oral tablet  1 tab(s) orally once a day (in the morning)   lipitor 20 mg oral tablet  1 tab(s) orally once a day (in the evening)   aspirin 81 mg oral tablet  1 tab(s) orally once a day (in the morning)   docusate sodium sodium 100 mg oral capsule  2 cap(s) orally 2 times a day   klonopin 1 mg oral tablet  1 tab(s) orally 3 times a day   prilosec 40 mg oral delayed release capsule  1 cap(s) orally 2 times a day   evista 60 mg oral tablet  1 tab(s) orally once a day   cozaar 50 mg oral tablet  1 tab(s) orally once a day   acetaminophen 325 mg oral tablet  2 tab(s) orally every 4 hours, As needed, pain or temp. greater than 100.4   nitroglycerin 0.4 mg sublingual tablet  1 tab(s) sublingual , As needed, chest pain   insulin detemir 100 units/ml subcutaneous solution   subcutaneous    metoprolol tartrate 50 mg oral tablet  1 tab(s) orally every 12 hours   furosemide 40 mg oral tablet  1 tab(s) orally once a day   potassium chloride 10 meq oral tablet, extended release  1 tab(s) orally once a day   ferrous sulfate 325 mg (65 mg elemental iron) oral tablet  1 tab(s) orally    ciprofloxacin 500 mg oral tablet  1 tab(s) orally every 12 hours    PRESCRIPTIONS: ELECTRONICALLY SUBMITTED   Physician's Instructions:  Home Health? No   Home Oxygen? No   Diet Low Sodium  Low Fat, Low Cholesterol   Activity Limitations As tolerated   Return to Work after follow up visit with MD   Time frame for Follow Up Appointment 1-2 days  Dr. Glendon Axe   Time frame for Follow Up Appointment 1-2 weeks  [facility]   Electronic Signatures: Glendon Axe (MD)  (Signed 15-Oct-14 14:02)  Authored: ADMISSION DATE AND DIAGNOSIS, CHIEF  COMPLAINT/HPI, PERTINENT LABS, [place], PATIENT INSTRUCTIONS   Last Updated: 15-Oct-14 14:02 by Glendon Axe (MD)

## 2015-02-07 NOTE — Discharge Summary (Signed)
PATIENT NAME:  Carolyn Valdez, Carolyn Valdez MR#:  [id number] DATE OF BIRTH:  03-02-1941  DATE OF ADMISSION:  08/24/2013 DATE OF DISCHARGE:  08/28/2013    DISCHARGE DIAGNOSES:  1. Extended-spectrum beta-lactamase Escherichia coli bacteremia and urinary tract infection.  2. Congestive heart failure, acute diastolic.  3. Positive troponins, likely supply/demand ischemia.  4. Diabetes.  5. Debility.   HISTORY OF PRESENT ILLNESS: Please see admission history and physical. Briefly, this is a 74 year old woman with a history of morbid obesity, diabetes and hypertension, who has had recurrent urinary tract infections with increasingly resistant E. coli as an outpatient. She developed symptoms of a urinary tract infection as well as some fevers. She was started as an outpatient on Ceftin; however, worsened and was admitted.   HOSPITAL COURSE: She was found to have a urinary tract infection and to have bacteremia with E. coli. Initially, she was treated with cefepime, but once her fevers persisted on this, she was switched to meropenem. Her culture ended up growing an ESBL E. coli. She clinically improved over the next several days after the meropenem. Her course was complicated by the development of positive troponins and volume overload. She was seen by Dr. Ubaldo Glassing, who recommended conservative management and that this was likely supply/demand ischemia. She was continued on her metoprolol, aspirin and a statin.   For her congestive heart failure, she was diuresed with IV Lasix. Her metabolic panel was monitored. She was able to wean off O2.   She was seen by physical therapy, who recommended skilled nursing facility both for the rehab and for IV antibiotics.   DISCHARGE MEDICATIONS:  1. Neurontin one 3 times a day 300 mg.  2. Allegra once a day.  3. Meropenem 1 gram IV q.8 hours for 8 more days, then stop.  4. Lipitor 20 once a day.  5. Aspirin 81 once a day.  6. Colace 100 two capsules twice a day.  7. Klonopin 1 mg  3 times a day.  8. Prilosec 40 mg twice a day.  9. Evista 1 tablet orally daily.  10. Cozaar 50 mg once a day.  11. Acetaminophen 325 two tablets every 4 hours as needed for pain, fevers  12. Nitroglycerin 0.4 sublingual as needed for pain.  13. Insulin detemir 80 units subcutaneous q.24.  14. Sliding scale insulin.  15. Metoprolol 50 mg q.12 hours.  16. Potassium chloride 10 mEq once a day.  17. Ferrous sulfate 325 once a day.  18. Furosemide 20 mg 1 tablet once a day. Can take twice a day as needed for edema.   DISCHARGE TO: Skilled nursing facility.   NURSING INSTRUCTIONS: PICC care per protocol. CBC and CMET will be needed in 5 days after discharge to monitor on the IV antibiotics.   DISCHARGE DIET: Low sodium, carbohydrate, ADA-controlled diet, regular consistency.   ACTIVITY: As tolerated.   FOLLOWUP: The patient will follow up with Dr. Ola Spurr in 1 to 2 weeks.   TIME SPENT: This discharge took 40 minutes.  ____________________________ Cheral Marker. Ola Spurr, MD dpf:lb D: 08/28/2013 08:15:15 ET T: 08/28/2013 09:02:15 ET JOB#: [id number]  cc: Cheral Marker. Ola Spurr, MD, <Dictator> Keatyn Luck Ola Spurr MD ELECTRONICALLY SIGNED 08/29/2013 21:06

## 2015-02-07 NOTE — Op Note (Signed)
PATIENT NAME:  Carolyn Valdez, MODEL MR#:  [id number] DATE OF BIRTH:  06-18-1941  DATE OF PROCEDURE:  08/27/2013  PREOPERATIVE DIAGNOSIS: Resistant urinary tract infection with need for extended intravenous antibiotics.   POSTOPERATIVE DIAGNOSIS: Resistant urinary tract infection with need for extended intravenous antibiotics.   PROCEDURES:  1.  Ultrasound guidance for vascular access to left basilic vein.  2.  Fluoroscopic guidance for placement of catheter.  3.  Insertion of peripherally inserted central venous catheter, left arm.  SURGEON: Algernon Huxley, MD  ANESTHESIA: Local.   ESTIMATED BLOOD LOSS: Minimal.   INDICATION FOR PROCEDURE: This is a 74 year old female with resistant urinary tract infection who needs IV antibiotics extended.   DESCRIPTION OF PROCEDURE: The patient's left arm was sterilely prepped and draped, and a sterile surgical field was created. The left basilic vein was accessed under direct ultrasound guidance without difficulty with a micropuncture needle and permanent image was recorded. 0.018 wire was then placed into the superior vena cava. Peel-away sheath was placed over the wire. A single lumen peripherally inserted central venous catheter was then placed over the wire and the wire and peel-away sheath were removed. The catheter tip was placed into the superior vena cava and was secured at the skin at 41 cm with a sterile dressing. The catheter withdrew blood well and flushed easily with heparinized saline. The patient tolerated procedure well.   ____________________________ Algernon Huxley, MD jsd:jcm D: 08/27/2013 17:00:37 ET T: 08/27/2013 20:21:06 ET JOB#: [id number]  cc: Algernon Huxley, MD, <Dictator> Algernon Huxley MD ELECTRONICALLY SIGNED 08/30/2013 14:47

## 2015-02-08 NOTE — H&P (Signed)
PATIENT NAME:  Carolyn Valdez, Carolyn Valdez MR#:  [id number] DATE OF BIRTH:  Feb 16, 1941  DATE OF ADMISSION:  09/02/2014  PRIMARY CARE PHYSICIAN:  Dr. Ola Spurr.    CHIEF COMPLAINT: Weakness.   HISTORY OF PRESENT ILLNESS: This is a 74 year old female who has been feeling very fatigued and weak going on for a while now. She has been walking with a walker over the last few weeks, but she is getting worse and worse. She feels like she is going to collapse. She has been having black stools for a while, but she does take iron. In the ER she was found to have a hemoglobin of 7.5. She was found to have guaiac positive stool. Hospitalist services were contacted for further evaluation.   PAST MEDICAL HISTORY: Frequent UTIs and urinary incontinence, anemia, IBS, diabetes, breast cancer history, hypertension, hyperlipidemia, anxiety, osteoporosis, neuropathy.   PAST SURGICAL HISTORY: Cholecystectomy, lumpectomy, then a mastectomy on the right, bilateral shoulder operations, right knee replacement, left knee surgery, bilateral feet operations, and tonsils.   ALLERGIES: MORPHINE, PREDNISONE, ROCEPHIN, SULFA, LATEX, AND TAPE.   MEDICATIONS: Include Actos 30 mg daily, Allegra 24 mg daily, amlodipine 5 mg daily, aspirin 81 mg daily, atorvastatin 20 mg daily, clonazepam 1 mg 3 times a day, Cozaar 50 mg daily, DDAVP 0.2 mg daily, Colace 100 mg 2 tablets twice a day, Evista 60 mg daily, ferrous sulfate 325 mg daily, Glucophage 1000 mg twice a day and 500 mg at noon, Glucotrol 10 mg 2 tablets in the morning, hydroxyzine-hydrochlorothiazide 25 mg daily, Lantus 50 units subcutaneous injection at bedtime, metoprolol tartrate 50 mg twice a day, Myrbetriq 50 mg in the evening, Neurontin 300 mg 3 times a day, Prilosec 40 mg twice a day, Relafen 500 mg twice a day, VESIcare 10 mg in the evening, vitamin E 1 tablet daily.   SOCIAL HISTORY: No smoking. No alcohol. No drug use. Lives alone. Used to work at [employer]: Father died of  lung cancer, also had alcohol. Mother died at age 4 probably of urinary infection and sepsis.   REVIEW OF SYSTEMS:  CONSTITUTIONAL: Positive for pains in the head. Positive for weight gain. Positive for weakness. No fever or chills.  EYES: She does wear glasses.  EARS, NOSE, MOUTH, AND THROAT: No hearing loss. No sore throat. No difficulty swallowing.  CARDIOVASCULAR: No chest pain. No palpitations.  RESPIRATORY: Positive for shortness of breath. No cough. No sputum. No hemoptysis.  GASTROINTESTINAL: Positive for nausea. No abdominal pain. Positive for black stools with iron. One month ago did see bright red blood per rectum.   GENITOURINARY: Positive for frequent UTIs. No burning or hematuria.  MUSCULOSKELETAL: No joint pain.  INTEGUMENT: No rashes or eruptions. Does have some itching.  NEUROLOGIC: No fainting or blackouts, but feels weak total body.  PSYCHIATRIC: On medication for depression and anxiety.  ENDOCRINE: No thyroid problems.  HEMATOLOGIC AND LYMPHATIC: History of anemia with iron infusions in the past with Dr. Grayland Ormond.   PHYSICAL EXAMINATION:  VITAL SIGNS: Temperature 98, pulse 58, respirations 16, blood pressure 143/80, pulse oximetry 100% on room air.  GENERAL: No respiratory distress.  EYES: Conjunctivae pale, lids normal. Pupils equal, round, and reactive to light. Extraocular muscles intact. No nystagmus.  EARS, NOSE, MOUTH, AND THROAT: Tympanic membranes, no erythema. Nasal mucosa, no erythema. Throat, no erythema. No exudate seen. Lips and gums, no lesions.  NECK: No JVD. No bruits. No lymphadenopathy. No thyromegaly. No thyroid nodules palpated.  LUNGS: Clear to auscultation. No  use of accessory muscles to breathe. No rhonchi, rales, or wheeze heard.  CARDIOVASCULAR: S1, S2 normal. No gallops, rubs, or murmurs. Carotid upstroke 2 + bilaterally. No bruits. Dorsalis pedis pulses difficult to palpate secondary to 4 + lower extremity edema.  ABDOMEN: Soft, nontender. No  organomegaly/splenomegaly. Normoactive bowel sounds. No masses felt.  RECTAL: The ER physician did rectal exam, showed guaiac-positive stool.  LYMPHATIC: No lymph nodes in the neck.  MUSCULOSKELETAL: 4 + edema. No clubbing. No cyanosis.  SKIN: Chronic lower extremity discoloration pinkish on the shins. No ulcers or lesions anteriorly.  NEUROLOGIC: Cranial nerves II through XII grossly intact. Deep tendon reflexes 1/2 + bilateral lower extremities.  PSYCHIATRIC: The patient is oriented to person, place, and time.   LABORATORY AND RADIOLOGICAL DATA: Urinalysis, trace leukocyte esterase, otherwise negative, 2 + blood. Troponin negative. Glucose 281, BUN 34, creatinine 1.28, sodium 138, potassium 4.7, chloride 103, CO2 of 30, calcium 8.2. White blood cell count 5.8, H and H 7.5 and 24.3, platelet count of 261,000. EKG, normal sinus rhythm, flipped T waves laterally.   ASSESSMENT AND PLAN:  1.  Symptomatic anemia with weakness and shortness of breath. We will add on iron studies to current laboratories to see if the patient is iron deficient, get a GI consult. I will transfuse 1 unit of packed red blood cells over 4 hours. Benefits and risks of transfusion explained.  2.  Weakness. Will get physical therapy evaluation. Hopefully the weakness is secondary to the anemia and the patient will feel stronger once hemoglobin is up a little higher.  3.  Lower extremity edema. I will stop Actos, give low-dose Lasix during the day, try to get rid of fluid. This may be able to stop some of her urinary incontinence medications at night.  4.  Diabetes. While on clear liquid diet I will decrease the Lantus dose to 20 units and put on sliding scale and hold the oral medications.  5.  Hypertension. Can consider stopping the Norvasc if swelling still becomes an issue, but we will continue that at this point.  6.  Urinary incontinence on 3 medications. Will try to decrease the Myrbetriq and try to get of fluid during the  day with Lasix.  7.  Gastroesophageal reflux disease, on Prilosec at home. Will put on Protonix here with the GI bleed.   TIME SPENT ON ADMISSION: 50 minutes.   CODE STATUS: The patient is a DNR.     ____________________________ Tana Conch. Leslye Peer, MD rjw:bu D: 09/02/2014 17:00:20 ET T: 09/02/2014 17:17:04 ET JOB#: [id number]  cc: Tana Conch. Leslye Peer, MD, <Dictator> Cheral Marker. Ola Spurr, MD Marisue Brooklyn MD ELECTRONICALLY SIGNED 09/03/2014 12:58

## 2015-02-08 NOTE — Consult Note (Signed)
Brief Consult Note: Diagnosis: anemia.   Patient was seen by consultant.   Consult note dictated.   Comments: Appreciate consult for 74 y/o caucasian woman with history of breast cancer 1990s, IDA- formerly followed by Dr Grayland Ormond until 1/15, DM,  recurrent UTI with recent hospitalization for same, IBS, right TKR 2008, for evaluation of anemia exacerbation with heme positive stool result found by the ED physician this visit. Patient report increasing weakness over the last few months- was attributed to deconditioning during hospitalization for same 8/15. There was a noted hgb drop from 9.7 in Sep to 7.5 now. States that she has black stools, but attributes this to her iron supplementation. Unsure if her stools as tarry or not. Reports also frequent nausea, but attributes this to her diabetes. Takes Omeprazole 40mg  po bid. Denies NSAIDs. Denies abdominal pain, vomiting, uncontrolled heartburn, loss of appetite/loss of weight, and all other GI complaints. Her main concern is getting her strength back so she can go home, stay home, and care for her dog. Had colonoscopy and EGD 2008 by Dr Sonny Masters: colonoscopy demonstrated hemorrhoids/diverticula. EGD demonstrated gastritis.  States she has never had VCE. Do note low ferritin and serum iron, Bun 26 with normal creatinine. She received a unit of PRBCs this visit. Hgb up to 8.5. Nursing note indicates large green stool today. Abd benign on exam. She is hemodynamically stable.  Has current UTI with gram neg rods, further ID pending.  IMpression and plan. Exacerbation of anemia; heme pos stool. Dyspepsia. Agree with PPI therapy. Do think she would benefit from luminal evaluation- with her dyspepsia would recommend starting with EGD as clinically feasible: will discuss further with Dr Gustavo Lah.  Electronic Signatures: Stephens November H (NP)  (Signed [phone] 15:56)  Authored: Brief Consult Note   Last Updated: 17-Nov-15 15:56 by Theodore Demark (NP)

## 2015-02-08 NOTE — Consult Note (Signed)
Chief Complaint:  Subjective/Chief Complaint Please see full Gi consult and brief consult note.  Paitent admitted with symptomatic anemia in the setting of prior h/o similar.  Patietn denies GI sx.  Does take bid ppi, positive nsaid.  recommend repeat GI evaluation ( last egd and colonoscopy 2008, no VCE done at that time) .  Will proceed with egd tomorrow pm.  I have discussed the risks benefits and complications of egd to include not limited to bleeding infection perforation and sedation and she wishes to proceed.   VITAL SIGNS/ANCILLARY NOTES: **Vital Signs.:   17-Nov-15 18:36  Vital Signs Type Q 4hr  Temperature Temperature (F) 98.1  Celsius 36.7  Temperature Source oral  Pulse Pulse 95  Respirations Respirations 20  Systolic BP Systolic BP [id number]  Diastolic BP (mmHg) Diastolic BP (mmHg) 76  Mean BP 99  Pulse Ox % Pulse Ox % 93  Pulse Ox Activity Level  At rest  Oxygen Delivery Room Air/ 21 %   Brief Assessment:  GEN obese   Cardiac Regular   Respiratory clear BS   Gastrointestinal details normal Nontender  Nondistended  Bowel sounds normal   Lab Results: Routine Chem:  16-Nov-15 12:10   BUN  34  17-Nov-15 07:24   BUN  26  Routine Hem:  16-Nov-15 12:10   Hemoglobin (CBC)  7.5  Platelet Count (CBC) 261  17-Nov-15 07:24   Hemoglobin (CBC)  8.7  Platelet Count (CBC) 258   Electronic Signatures: Loistine Simas (MD)  (Signed 17-Nov-15 18:57)  Authored: Chief Complaint, VITAL SIGNS/ANCILLARY NOTES, Brief Assessment, Lab Results   Last Updated: 17-Nov-15 18:57 by Loistine Simas (MD)

## 2015-02-08 NOTE — Consult Note (Signed)
Chief Complaint:  Subjective/Chief Complaint seen for anemia and heme positive stool.  tolerating po (soft) denies n or abdominal pain.  no bm for several days ( note colonoscopy prep).   VITAL SIGNS/ANCILLARY NOTES: **Vital Signs.:   20-Nov-15 14:13  Vital Signs Type Routine  Temperature Temperature (F) 98.9  Celsius 37.1  Temperature Source oral  Pulse Pulse 60  Respirations Respirations 20  Systolic BP Systolic BP 595  Diastolic BP (mmHg) Diastolic BP (mmHg) 68  Mean BP 86  Pulse Ox % Pulse Ox % 94  Pulse Ox Activity Level  At rest  Oxygen Delivery 1L   Brief Assessment:  Cardiac Regular   Respiratory clear BS   Gastrointestinal details normal Nontender  Bowel sounds normal  obese, unable to palpate internal organs.   Lab Results: Pathology:  18-Nov-15 00:00   Pathology Report CASE: [id number] PATIENT: Carolyn Valdez Surgical Pathology Report      SPECIMEN SUBMITTED: A. Stomach, antrum, biopsy B. Stomach body, biopsy   CLINICAL HISTORY: None provided   PRE-OPERATIVE DIAGNOSIS: Anemia   POST-OPERATIVE DIAGNOSIS: Bile gastritis      DIAGNOSIS: A. STOMACH, ANTRUM; BIOPSY: -ANTRAL MUCOSA WITH MILD CHRONIC GASTRITIS. -NEGATIVE FOR H PYLORI, DYSPLASIA AND MALIGNANCY.  B. STOMACH, BODY; BIOPSY: - OXYNTIC MUCOSA WITH MILD CHRONIC ACTIVE GASTRITIS. - NEGATIVE FOR DYSPLASIA AND MALIGNANCY.  Note Immunohistochemical stain for H. pylori is obtained on part B and the results will be summarized in an addendum.   GROSS DESCRIPTION: A. Labeled: Antrum stomach biopsies Tissue Fragment(s): 2 Measurement: 0.4-0.5 cm Comment: Tissue fragments  Entirely submitted in cassette(s): 1  B. Labeled: Stomach body biopsies Tissue Fragment(s): 3 Measurement: 0.2-0.4 cm Comment: Tan tissue fragments  Entirely submitted in cassette(s): 1   Final Diagnosis performed by Delorse Lek, MD.  Electronically signed 09/05/2014 2:22:39PM    The electronic signature  indicates that the named Attending Pathologist has evaluated the specimen  Technical component performed at [facility] [address] [place], [place] [address] Lab: [phone] Dir: Darrick Penna. Evette Doffing, MD  Professional component performed at [facility], [facility], [place], [place], [place] [address] Lab: [phone] Dir: Dellia Nims. Reuel Derby, MD   19-Nov-15 00:00   Pathology Report CASE: [id number] PATIENT: Carolyn Valdez Surgical Pathology Report      SPECIMEN SUBMITTED: A. Colon polyp, transverse,  CBX B. colon, transverse, distal, CBX C. Colon, descending, distal D. Colon, sigmoid, proximal CBX E. Atypical mucosa @ 43cm fold   CLINICAL HISTORY: None provided   PRE-OPERATIVE DIAGNOSIS: Heme + stools, Anemia   POST-OPERATIVE DIAGNOSIS: None provided      DIAGNOSIS: A. COLON POLYP, TRANSVERSE; COLD BIOPSY: - HYPERPLASTIC POLYP. - NEGATIVE FORDYSPLASIA AND MALIGNANCY.  B. COLON, TRANSVERSE, DISTAL; COLD BIOPSY: - COLONIC MUCOSA WITH FOCAL ISCHEMIC CHANGE. - NEGATIVE FOR DYSPLASIA AND MALIGNANCY.  C. COLON, DESCENDING, DISTAL; BIOPSY: - COLONIC MUCOSA WITH ISCHEMIC COLITIS. - NEGATIVE FOR VIRUS CYTOPATHIC EFFECT, DYSPLASIA, AND MALIGNANCY.  D. COLON, SIGMOID, PROXIMAL; COLD BIOPSY: - UNREMARKABLE COLONIC MUCOSA.  E. ATYPICAL MUCOSA AT 43 CM FOLD; BIOPSY: - COLONIC MUCOSA WITH FOCAL HYPERPLASTIC EPITHELIAL CHANGE. - NEGATIVE FOR DYSPLASIA AND MALIGNANCY.    GROSS DESCRIPTION: A. Labeled: Cold biopsy transverse colon polyp Tissue Fragment(s): 1 Measurement: 0.5 cm Comment: Tan tissue fragment  Entirely submitted in cassette(s): 1  B. Labeled: Cold biopsies distal transverse colon Tissue Fragment(s): 2 Measurement: 0.4-0.5 cm Comment: Tan tissue fragments  Entirely submitted in cassette(s): 1  C. Labeled: Cold biopsy distal descending colon (impression ischemia) Tissue Fragment(s): 2 Measurement: Each  0.3 cm Comment: Pink  red tissue fragments  Entirely submitted in cassette(s): 1  D. Labeled: Cold biopsy proximal sigmoid colon Tissue Fragment(s): 2 Measurement: Each 0.3 cm Comment: Tan tissue fragments  Entirely submitted in cassette(s): 1  E. Labeled: Cold biopsy atypical mucosa at 43 cm fold Tissue Fragment(s): 2 Measurement: 0.2-0.4 cm Comment: Tan tissue fragments  Entirely submitted in cassette(s): 1   Final Diagnosis performed by Quay Burow, MD.  Electronically signed 09/06/2014 9:53:48AM    The electronic signature indicates that the named Attending Pathologist has evaluated the specimen  Technical component performed at [facility], [address] [place], [place] [address] Lab: [phone] Dir: Darrick Penna. Evette Doffing, MD  Professional component performed at [facility], [place], [place], [place] [address] Lab: [phone] Dir: Dellia Nims. Rubinas, MD   Routine Chem:  20-Nov-15 06:23   Glucose, Serum  177  BUN 16  Creatinine (comp)  1.39  Sodium, Serum 138  Potassium, Serum 4.2  Chloride, Serum 102  CO2, Serum 29  Calcium (Total), Serum  8.4  Anion Gap 7  Osmolality (calc) 281  eGFR (African American)  48  eGFR (Non-African American)  40 (eGFR values <51m/min/1.73 m2 may be an indication of chronic kidney disease (CKD). Calculated eGFR, using the MRDR Study equation, is useful in  patients with stable renal function. The eGFR calculation will not be reliable in acutely ill patients when serum creatinine is changing rapidly. It is not useful in patients on dialysis. The eGFR calculation may not be applicable to patients at the low and high extremes of body sizes, pregnant women, and vegetarians.)  Routine Hem:  20-Nov-15 06:23   WBC (CBC) 6.5  RBC (CBC)  3.07  Hemoglobin (CBC)  8.3  Hematocrit (CBC)  26.3  Platelet Count (CBC) 236  MCV 86  MCH 27.0  MCHC  31.5  RDW 14.4  Neutrophil % 66.4  Lymphocyte % 16.3  Monocyte % 13.2   Eosinophil % 3.6  Basophil % 0.5  Neutrophil # 4.3  Lymphocyte # 1.1  Monocyte # 0.9  Eosinophil # 0.2  Basophil # 0.0 (Result(s) reported on 06 Sep 2014 at 0[facility])   Radiology Results: CT:    20-Nov-15 13:18, CT Angiography Abdomen/Pelvis W/WO Contrast  CT Angiography Abdomen/Pelvis W/WO Contrast   REASON FOR EXAM:    mesenteric ischemia?, ischemic colitis  COMMENTS:       PROCEDURE: CT  - CT ANGIOGRAPHY ABD/PEL W/WO  - Sep 06 2014  1:18PM     CLINICAL DATA:  Ischemic colitis.    EXAM:  CTA ABDOMEN AND PELVIS WITH CONTRAST    TECHNIQUE:  Multidetector CT imaging of the abdomen and pelvis was performed  using the standard protocol during bolus administration of  intravenous contrast. Multiplanar reconstructed images and MIPs were  obtained and reviewed to evaluate the vascular anatomy.  CONTRAST:  80 mL of Isovue 370 intravenously.    COMPARISON:  CT scan of July 06, 2007.    FINDINGS:  Multilevel degenerative disc disease is noted in the lumbar spine.  Visualized lung bases appear normal.    The liver, spleen and pancreas appear normal. Status post  cholecystectomy. Adrenal glands and kidneys appear normal. No  hydronephrosis or renal obstruction is noted. No renal or ureteral  calculi are noted. Mild calcified plaque is noted at the origin of  the celiac and superior mesenteric arteries which does not result in  significant stenosis. The inferior mesenteric artery is widely  patent. Atherosclerotic calcifications  are noted throughout the  abdominal aorta without evidence of aneurysm or dissection the renal  arteries are widely patent without significant stenosis. There is no  evidence of thrombus or occlusion involving the mesenteric or renal  arteries. The visualized portions of the iliac arteries are free of  significant stenosis.    The portal, splenic and superior mesenteric veins appear to be  patent.    Uterus and urinary bladder appear normal  ovaries appear normal.  There is no evidence of bowel obstruction. No abnormal fluid  collection is noted. There is no definite evidence of ischemic  colitis. No pneumatosis is noted. Mild sigmoid diverticulosis is  noted without inflammation.  Review of the MIP images confirms the above findings.     IMPRESSION:  There is no evidence of significant mesenteric artery stenosis or  thrombosis. No evidence of abdominalaortic aneurysm or dissection  is noted. No significant renal artery stenosis is noted. There is no  definite evidence of ischemic colitis seen on this exam.      Electronically Signed    By: Sabino Dick M.D.    On: 09/06/2014 14:00         VerifiedBy: Marveen Reeks, M.D.,   Assessment/Plan:  Assessment/Plan:  Assessment 1) anemia, heme positive stool. EGD showing bile gastritis (unlikely source for blood loss anemia) and colonoscopy showing a short segment of ischemia colitis.  Although the origins of the mesenterics show plaques, there is no stenosis.  Patient relates no significant of acute GI bleeding (hematemesis or hematochezia) prior to admission.   Plan 1) recommend further evaluation re heme positive stool-SBS and video capsule endoscopy.  These can be done inpatient or outpatient.  Patietn relates it is difficult for her to get to appointments as o/p,  although she does drive herself walking is the issue.   I will obtain several labs to rule out coagulopathic entities in am, for fu as o/p.   Electronic Signatures: Loistine Simas (MD)  (Signed [phone] 19:37)  Authored: Chief Complaint, VITAL SIGNS/ANCILLARY NOTES, Brief Assessment, Lab Results, Radiology Results, Assessment/Plan   Last Updated: 20-Nov-15 19:37 by Loistine Simas (MD)

## 2015-02-08 NOTE — Discharge Summary (Signed)
PATIENT NAME:  Carolyn Valdez, Carolyn Valdez MR#:  [id number] DATE OF BIRTH:  06/04/1941  DATE OF ADMISSION:  05/22/2014 DATE OF DISCHARGE:  05/24/2014  For a detailed note, please see the history and physical done on admission by Dr. Vianne Bulls.   DIAGNOSES AT DISCHARGE: As follows: Generalized weakness secondary to deconditioning and underlying urinary tract infection, extended spectrum beta-lactamase urinary tract infection, morbid obesity, diabetes, hypertension, hyperlipidemia, diabetic neuropathy.   The patient is being discharged home with a low-sodium, low-fat, American diabetic Association diet. She is being discharged with home health nursing and physical therapy and social work services.   DISCHARGE MEDICATIONS: As follows: Allegra 180 mg daily, aspirin 81 mg daily, Colace 100 mg two capsules b.i.d., Prilosec 40 mg b.i.d., amlodipine 5 mg daily, VESIcare 10 mg daily, Myrbetriq 50 mg daily, Klonopin 1 mg t.i.d.,  metoprolol tartrate 100 mg b.i.d., Neurontin 300 mg b.i.d., at lunchtime and at evening. Atorvastatin 20 mg daily, hydroxyzine 25 mg daily, Evista 60 mg daily, Actos 30 mg daily, losartan 50 mg daily, nabumetone 500 mg b.i.d., metformin 1000 mg b.i.d., DDAVP 0.2 mg daily, vitamin E 1 tablet daily, Ferrex 150 mg 1 tablet t.i.d., Lasix 20 mg daily, Lantus 50 units at bedtime, ertapenem IV 1 gram daily x 7 days.   PERTINENT STUDIES DONE DURING THE HOSPITAL COURSE: As follows: Chest x-ray done on admission showing PICC line in stable position, cardiomegaly with no evidence of CHF.   HOSPITAL COURSE: This is a 74 year old female who presented to the hospital due to generalized weakness.   1. Weakness. The most likely cause of this was related to deconditioning given her morbid obesity with underlying urinary tract infection. The patient has been adequately treated for urinary tract infection with intravenous meropenem. She was hemodynamically stable with no evidence of sepsis. Her blood cultures are  negative. She was seen by physical therapy. They thought she would benefit from home health PT, which was arranged for her prior to discharge.  2. Diabetes. The patient's blood sugars remained stable. She will continue her maintenance medications including Actos, metformin and Lantus.  3. Extended spectrum beta-lactamase urinary tract infection. The patient was maintained on ertapenem. She will resume that. She has 7 more days left of intravenous antibiotic therapy.  4. History of congestive heart failure. The patient was clinically not in congestive heart failure in the hospital. She will continue her Lasix and her beta blockers as stated.  5. Hyperlipidemia. The patient was maintained on atorvastatin. She will resume that.  6. Diabetic neuropathy. The patient was maintained on Neurontin. She will resume that.  7. Urinary incontinence. The patient was maintained on her VESIcare. She will resume that.  8. Gastroesophageal reflux disease. The patient was maintained on Prilosec. She will also resume that upon discharge.  9. Hypertension. The patient remained hemodynamically stable. She will continue her metoprolol, Norvasc and losartan upon discharge.   CODE STATUS: The patient is a full code.   TIME SPENT ON DISCHARGE: 40 minutes.    ____________________________ Belia Heman. Verdell Carmine, MD vjs:jh D: 05/24/2014 14:53:32 ET T: 05/25/2014 01:48:51 ET JOB#: [id number]  cc: Belia Heman. Verdell Carmine, MD, <Dictator> Henreitta Leber MD ELECTRONICALLY SIGNED 05/31/2014 14:29

## 2015-02-08 NOTE — Discharge Summary (Signed)
PATIENT NAME:  Carolyn Valdez, GRANDY MR#:  [id number] DATE OF BIRTH:  January 08, 1941  DATE OF ADMISSION:  09/02/2014 DATE OF DISCHARGE:  09/11/2014  DISCHARGE DIAGNOSES:  1. Extended spectrum beta-lactamase urinary tract infection.   2. Chronic blood loss anemia.  3. Hypertension.  4. Morbid obesity.  5. Diabetes mellitus.  6. Generalized weakness.  7. Chronic kidney disease stage III.  8. Mild hyperkalemia.    9. Anxiety.  10. Depression.  11. Neuropathy.  12. Constipation.   CONSULTS:  1.  Dr. Ola Spurr with infectious disease.  2.  Dr. Gustavo Lah with GI.  3.  Physical therapy.   IMAGING STUDIES: Include a CT angiography of abdomen and pelvis, showed atherosclerotic disease, but no aneurysm or stenosis. No signs of ischemic colitis.   Small bowel study showed normal mucosa of jejunum and ileum, small proximal duodenal diverticulum.   ADMITTING HISTORY AND PHYSICAL: Please see detailed H and P dictated previously. In brief a 74 year old morbidly obese Caucasian female patient, presented to the hospital complaining of fatigue. She also had complaints of some black stools, was brought to the Emergency Room and admitted to hospitalist service after found to have guaiac-positive stools.   HOSPITAL COURSE: The patient had endoscopy done, which showed normal esophagus, bile gastritis, no acute bleeding or ulcers found.   Colonoscopy showed possible ischemic colitis.   HOSPITAL COURSE:  1.  Chronic GI bleed with iron deficiency anemia. The patient had a colonoscopy done which showed possible ischemic colitis, after which CT angiography abdomen and pelvis was done which showed no significant stenosis. The patient had no further bleeding. Endoscopy showed some mild bile gastritis, no bleeding. She was started on PPIs along with sucralfate. She also had a small bowel study which showed nothing acute. The patient is supposed to have a capsule endoscopy as outpatient with Dr. Gustavo Lah in the office. The  patient's hemoglobin stable during the hospital stay.  2.  ESBL UTI. The patient had ESBL UTI for which she is being started on nitrofurantoin which the Escherichia coli is sensitive to after consulting with Dr. Ola Spurr of infectious disease who she sees in the office. Initially she was asymptomatic and ESBL was less than 40,000, but later she developed symptoms and the culture grew greater than 100,000 and antibiotics have been started.  3.  Generalized weakness secondary to anemia and UTI, improving. The patient worked with physical therapy, home health was recommended which has been set up.  3.  Diabetes, hypertension, fairly controlled during the hospital stay.   PHYSICAL EXAMINATION:   GENERAL:  Prior to discharge the patient's examination showed morbid obesity.  HEART:  S1, S2 heard without murmurs.  LUNGS: Sound clear.  ABDOMEN:  No abdominal tenderness.   DISCHARGE MEDICATIONS: Please refer to medication reconciliation.   DISCHARGE INSTRUCTIONS: Home health with physical therapy. Low-sodium, carbohydrate-controlled diet. Activity as tolerated. Follow up with Dr. Gustavo Lah, Dr. Ola Spurr, and Dr. Erlene Quan of urology in 1-2 weeks. The patient is to get a capsule endoscopy as outpatient with Dr. Gustavo Lah.     TIME SPENT ON DAY OF DISCHARGE IN DISCHARGE ACTIVITY:  35 minutes.      ____________________________ Leia Alf Belinda Schlichting, MD srs:bu D: 09/11/2014 13:45:27 ET T: 09/11/2014 16:40:18 ET JOB#: [id number]  cc: Alveta Heimlich R. Concha Sudol, MD, <Dictator> Neita Carp MD ELECTRONICALLY SIGNED 09/16/2014 15:11

## 2015-02-08 NOTE — Consult Note (Signed)
Chief Complaint:  Subjective/Chief Complaint seen for anemia, heme positive. stable overnight, no overt bleeding. denies abd pain or nausea.  one large bm last night, dark green.   VITAL SIGNS/ANCILLARY NOTES: **Vital Signs.:   18-Nov-15 06:11  Vital Signs Type Q 4hr  Temperature Temperature (F) 98.2  Celsius 36.7  Temperature Source oral  Pulse Pulse 53  Respirations Respirations 18  Systolic BP Systolic BP 0000000  Diastolic BP (mmHg) Diastolic BP (mmHg) 66  Mean BP 86  Pulse Ox % Pulse Ox % 93  Pulse Ox Activity Level  At rest  Oxygen Delivery Room Air/ 21 %   Brief Assessment:  GEN obese   Cardiac Regular   Respiratory clear BS   Gastrointestinal details normal Soft  Nontender  Nondistended  Bowel sounds normal   Lab Results:  Routine Hem:  16-Nov-15 12:10   Hemoglobin (CBC)  7.5  17-Nov-15 07:24   Hemoglobin (CBC)  8.7  18-Nov-15 05:39   Hemoglobin (CBC)  8.2 (Result(s) reported on 04 Sep 2014 at 06:19AM.)   Assessment/Plan:  Assessment/Plan:  Assessment 1) anemia, heme positive stool.  probable GI blood loss of uncertain etiology.  2) multiple medical issues with  UTIs, urinary incontinence, anemia, IBS, diabetes, breast cancer in 1990s, hypertension, hyperlipidemia, anxiety, osteoporosis, neuropathy, cholecystectomy, lumpectomy, mastectomy, bilateral shoulder operations, right knee replacements, left knee surgery, bilateral feet operations, and tonsillectomy.   Plan 1) egd today.  likely colonoscopy tomorrow. VCE as o/p. continue ppi for now. further recs to follow.   Electronic Signatures for Addendum Section:  Loistine Simas (MD) (Signed Addendum [phone] 13:33)  I have discussed the risks benefits and complications of egd to include not limited to bleeding infection perforation and sedation and she wishes to proced.   Electronic Signatures: Loistine Simas (MD)  (Signed [phone] 13:21)  Authored: Chief Complaint, VITAL SIGNS/ANCILLARY NOTES, Brief  Assessment, Lab Results, Assessment/Plan   Last Updated: 18-Nov-15 13:33 by Loistine Simas (MD)

## 2015-02-08 NOTE — Consult Note (Signed)
PATIENT NAME:  Carolyn Valdez, Carolyn Valdez MR#:  [id number] DATE OF BIRTH:  06-17-1941  DATE OF CONSULTATION:  09/03/2014  REFERRING PHYSICIAN:  Tana Conch. Leslye Peer, MD CONSULTING PHYSICIAN:  Theodore Demark, NP  REASON FOR CONSULTATION: GI consult ordered by Dr. Leslye Peer for evaluation of anemia.   REPORT OF CONSULTATION:  Appreciate consult for this 74 year old Caucasian woman with history of breast cancer in 1990s, iron deficiency anemia formerly followed by Dr. Grayland Ormond until January of this year, diabetes, recurrent UTI with recent hospitalization for same, right TKR in 2008, and IBS for evaluation of anemia exacerbation with heme-positive stool results found by the ED physician for this visit. The patient reports increasing weakness over the last few months, was attributed to deconditioning during the last hospitalization for same in August of this year. There was a noted hemoglobin drop from 9.7 in September to 7.5 now. States that she has had black stools but attributes this to her iron supplementation. Unsure if her stools are tarry or not. Reports frequent nausea, but attributes this to her diabetes. Takes omeprazole 40 mg p.o. b.i.d. denies NSAIDs, denies abdominal pain, vomiting, uncontrolled heartburn, loss of appetite, loss of weight, and all other GI complaints. Her main concern is getting her strength back so she can go home, stay home, and care for her dogs. Had colonoscopy and EGD in 2008, by Dr. Sonny Masters. Colonoscopy demonstrated hemorrhoids and diverticula. EGD demonstrated gastritis. States she has never had a VCE. Do note low ferritin and low serum iron, BUN 26 with normal creatinine. She has received a unit of packed red blood cells this visit. Hemoglobin currently up to 8.5. Nursing note indicates large green stool today.    PAST MEDICAL HISTORY: Frequent UTIs, urinary incontinence, anemia, IBS, diabetes, breast cancer in 1990s, hypertension, hyperlipidemia, anxiety, osteoporosis, neuropathy,  cholecystectomy, lumpectomy, mastectomy, bilateral shoulder operations, right knee replacements, left knee surgery, bilateral feet operations, and tonsillectomy.   ALLERGIES: MORPHINE, PREDNISONE, ROCEPHIN, SULFA, LATEX AND TAPE.   MEDICATIONS: Actos 30 mg p.o. daily, Allegra 24 mg p.o. daily, amlodipine 5 mg p.o. daily, ASA 81 mg p.o. daily, atorvastatin 20 mg p.o. daily, clonazepam 1 mg t.i.d., Cozaar 50 mg p.o. daily, DDAVP 0.2 mg daily, Colace 100 mg 2 tablets b.i.d., Evista 60 mg p.o. daily, ferrous sulfate 325 mg p.o. daily, Glucophage 1000 mg p.o. daily and 500 mg at noon, Glucotrol 10 mg 2 tablets in the morning, hydroxyzine, hydrochlorothiazide 25 mg p.o. daily, Lantus 50 units at bedtime, metoprolol 50 mg p.o. b.i.d., Myrbetriq 50 mg p.o. q. p.m. Neurontin 300 mg t.i.d., Prilosec 40 mg p.o. b.i.d., Relafen 500 mg b.i.d., VESIcare 10 mg p.o. q. p.m., Vitamin E 1 tablet p.o. daily.   SOCIAL HISTORY: Lives alone. No alcohol, smoking, illicits.   FAMILY HISTORY: Negative for colorectal cancer, colon polyps, liver disease, ulcers. Father had lung cancer and alcohol abuse.   REVIEW OF SYSTEMS: Ten systems were reviewed. Significant for intermittent headaches. Weight gain, weakness, dyspnea on exertion; otherwise, unremarkable.    MOST RECENT LABORATORIES: Glucose 88, BUN 26, creatinine 1.12, sodium 137, potassium 4.2, GFR 51, calcium 8.8. WBC 5.8, hemoglobin 8.7, hematocrit 26.9, platelet count normal. Vitamin B12 normal. Ferritin 8, serum iron 18. Urinalysis demonstrating current UTI with gram-negative rods; identification, culture and sensitivity is pending.   PHYSICAL EXAMINATION:  MOST RECENT VITAL SIGNS: Temperature 97.7, pulse 58, respiratory rate 20, blood pressure 137/73, SAO2 of 99% on room air.  GENERAL: Well-appearing woman, sitting in a chair in no acute distress.  HEENT: Normocephalic, atraumatic. Conjunctivae somewhat pale but pink. Sclerae are clear. Mucous membranes pink and  moist.  NECK: Supple. No thyromegaly or JVD.  CHEST: Respirations eupneic. Lungs clear.  CARDIAC: S1, S2. RRR. No M/R/G. There is 1+ lower extremity edema.  ABDOMEN: Bowel sounds x 4, obese. Abdomen nontender, nondistended. No guarding, rigidity, rebound, tenderness, or other peritoneal signs. Unable to appreciate hepatosplenomegaly or masses.  SKIN: Warm, dry, pink. No erythema, lesion or rash. Somewhat pale.  MUSCULOSKELETAL: No clubbing or cyanosis. Strength 5/5. MAEW x 4.  NEUROLOGIC: Alert, oriented x 3. Cranial nerves II through XII intact. Speech clear. No facial droop.  PSYCHIATRIC: Pleasant, calm, cooperative.   IMPRESSION AND PLAN: Exacerbation of anemia, heme-positive stool, dyspepsia. Agree with proton pump inhibitor therapy. Do think she would benefit for luminal evaluation. With her dyspepsia, would recommend starting with esophagogastroduodenoscopy as clinically feasible. I did discuss this with Dr. Gustavo Lah. We will plan for this on 09/04/2014, potentially followed by colonoscopy the following day, as clinically indicated. We will also defer the management of her current urinary tract infection to the primary team.   Thank you very much for this consult.    These services were provided by Stephens November, MSN, NP-C, in collaboration with Lollie Sails, MD.    ____________________________ Theodore Demark, NP chl:MT D: 09/04/2014 08:12:00 ET T: 09/04/2014 09:37:20 ET JOB#: [id number]  cc: Theodore Demark, NP, <Dictator> [place] SIGNED 09/09/2014 17:13

## 2015-02-08 NOTE — Consult Note (Signed)
Chief Complaint:  Subjective/Chief Complaint seen for anemia and heme positive stool.  some nausea this am, no abd pain,  tolerated prep for colonoscopy.   VITAL SIGNS/ANCILLARY NOTES: **Vital Signs.:   19-Nov-15 06:00  Vital Signs Type Q 4hr  Temperature Temperature (F) 97.8  Celsius 36.5  Pulse Pulse 60  Respirations Respirations 18  Systolic BP Systolic BP 123XX123  Diastolic BP (mmHg) Diastolic BP (mmHg) 70  Mean BP 87  Pulse Ox % Pulse Ox % 94  Pulse Ox Activity Level  At rest  Oxygen Delivery Room Air/ 21 %  *Intake and Output.:   19-Nov-15 00:00  Stool  small watery stool    00:50  Stool  small watery stool   Brief Assessment:  GEN obese   Cardiac Regular   Respiratory clear BS   Gastrointestinal details normal positive bowel sounds, mild discomfort to palpation in the llq, no rebound.   Lab Results: Routine Chem:  18-Nov-15 05:39   Hemoglobin A1c ([facility]) 6.2 (The American Diabetes Association recommends that a primary goal of therapy should be <7% and that physicians should reevaluate the treatment regimen in patients with HbA1c values consistently >8%.)  Routine Hem:  16-Nov-15 12:10   Hemoglobin (CBC)  7.5  17-Nov-15 07:24   Hemoglobin (CBC)  8.7  Platelet Count (CBC) 258  18-Nov-15 05:39   Hemoglobin (CBC)  8.2 (Result(s) reported on 04 Sep 2014 at 06:19AM.)   Assessment/Plan:  Assessment/Plan:  Assessment 1) anemia, heme positive stool. egd showing bile gastritis, no apparent bleedign site.  2) UTI-on abx.   Plan 1) colonoscopy today.  I have discussed the risks beneftis and complications of proceedure to include not limited to bleeding infection perforation and sedationa nd she wishes to proceed.   further recs to follow.   Electronic Signatures: Loistine Simas (MD)  (Signed [phone] 07:39)  Authored: Chief Complaint, VITAL SIGNS/ANCILLARY NOTES, Brief Assessment, Lab Results, Assessment/Plan   Last Updated: 19-Nov-15 07:39 by Loistine Simas  (MD)

## 2015-02-08 NOTE — H&P (Signed)
PATIENT NAME:  Carolyn Valdez, Carolyn Valdez MR#:  [id number] DATE OF BIRTH:  Apr 22, 1941  DATE OF ADMISSION:  05/22/2014  PRIMARY DOCTOR: Dr. Ola Spurr.  EMERGENCY ROOM PHYSICIAN: Dr. Corky Downs.  CHIEF COMPLAINT: Weakness.  HISTORY OF PRESENT ILLNESS: A 74 year old female patient discharged from hospital on August 2nd for ESBL UTI comes back because of generalized weakness. The patient gets home antibiotics through [facility] everyday at 4:30. The patient says that she went to sleep in her recliner last night and woke up around 8:30 this morning and could not get up from the recliner and she was in the recliner from 8:30 to 10:30 a.m.  because of her persistent weakness. Unable to get up from the recliner, she called 911. The patient denies any fever. No nausea. No vomiting. No diarrhea, no chest pain. No dizziness, no loss of consciousness. The patient is supposed to get physical therapy, but that did not start yet. The patient is getting IV Invanz through home health every day. The patient says that physical therapy has not started yet. The patient denies any falls. The patient uses a walker and says that sometimes she does not use it. The patient's lab work was within normal limits including chem 7, and CBC. the patient is evaluated by ER physician and we are admitting for possible occult sepsis causing weakness versus generalized weakness. Maybe she needs physical therapy and rehab placement.   PAST MEDICAL HISTORY: Significant for: 1. Hypertension.  2. Type 2 diabetes mellitus.  3. History of recurrent UTIs with recent ESBL UTI diagnosed during recent admission.  4. Overactive bladder.  5. Urinary incontinence.  6. Diabetic neuropathy.  7. Anxiety.  8. Hypertension.  9. History of breast cancer.  10. Hyperlipidemia.   ALLERGIES: SHE IS ALLERGIC TO MORPHINE, PREDNISONE, ROCEPHIN, SULFA, AND LATEX AND TAPE.   SOCIAL HISTORY: Quit smoking 20 years ago. No occasional alcohol. No drugs. The patient  lives by herself.   FAMILY HISTORY: Mother and father are deceased. Father died of lung cancer. Mother had complications of septic shock.   MEDICATIONS: The patient's current medications as per the discharge instructions from August 2nd shows: 1. Allegra 1 tablet 180 mg daily.  2. Aspirin 81 mg daily.  3. Colace 100 mg p.o. b.i.d.  4. Prilosec 40 mg p.o. b.i.d. 5. Norvasc 5 mg p.o. daily. 6. VESIcare 10 mg p.o. daily.  7. Myrbetriq 50 mg p.o. daily.  8. Clonazepam 1 mg p.o. t.i.d.  9. Metoprolol 100 mg p.o. b.i.d.  10. Neurontin 300 mg in the morning 1 capsule and 1 capsule in the afternoon and 2 capsules at bedtime. 11. Atorvastatin 20 mg p.o. daily.  12. Hydroxyzine 25 mg p.o. daily.  13. Evista 60 mg p.o. daily.  14. Actos 30 mg p.o. daily.  15. Losartan 50 mg p.o. daily.   17. Metformin 1 gram p.o. b.i.d.    19. Vitamin E 1 capsule p.o. daily.  20. Ferrex 150 mg p.o. t.i.d.  21. Lasix 20 mg p.o. daily.  22. Lantus 50 units at bedtime.  23. Invanz 1 gram every 24 hours for 9 days. That is patient will continue until 11th of August.  REVIEW OF SYSTEMS: CONSTITUTIONAL: Feels work but denies any fever. No fatigue. Patient says that she does not have any ( fever. EYES: No blurred vision.  ENT: No tinnitus, no epistaxis. No difficulty swallowing. RESPIRATORY: No cough, no wheezing.  CARDIOVASCULAR: No chest pain, no orthopnea, no PND.  GASTROINTESTINAL: No nausea, no vomiting, no abdominal  pain. No hematemesis. GENITOURINARY: The patient has no dysuria or hematuria. Has recent UTI.  ENDOCRINE: No polyuria, nocturia.  HEMATOLOGY: No anemia or easy bruising. INTEGUMENT: No skin rashes.  MUSCULOSKELETAL: The patient has no joint pains.  NEUROLOGIC: No numbness or weakness. No falls.  PSYCHOLOGICAL: The patient does have anxiety.   PHYSICAL EXAMINATION:  VITALS SIGNS: Temperature 98.5, heart rate 65, blood pressure 154/64, saturations 96% on room air.  GENERAL: The patient is  alert, awake, oriented, not in distress. HEENT: Normocephalic, atraumatic. Eyes: Pupils equal, reacting to light. Extraocular movements are intact. ENT: No tympanic membrane congestions. No  turbinate hypertrophy,no oropharyngeal erythema,NECK: Supple. No JVD. No carotid bruits. No thyroid enlargement.  HEART: S1 and S2. Regular, no murmurs. LUNGS: Clear to auscultation. No wheeze, no rales.  ABDOMEN: Soft, nontender, nondistended. Bowel sounds present.    EXTREMITIES: No extremity edema. No cyanosis. No clubbing.  NEUROLOGIC: Alert, awake, oriented. No focal neurological deficits. SKIN: Moist, warm, no rashes. LYMPHATIC: No lymphadenopathy.  LABORATORY DATA: WBC 7.1, hemoglobin 11, hematocrit 33.4, platelets 238. Electrolytes: Sodium 132, potassium 4.7, chloride 99, bicarbonate 29, BUN 29, creatinine 1.20, glucose 107.   Chest x-ray shows a PICC line in place, cardiomegaly. No evidence of CHF. No acute cardiopulmonary disease. Troponin less than 0.02.  UA is showing no leukocyte esterase. WBC none.    EKG: Normal sinus rhythm at 61 beats per minutes.   ASSESSMENT AND PLAN:  1. The patient is a 74 year old female with a recent extended spectrum beta lactamase urinary tract infection comes in with weakness. Most likely is generalized deconditioning requiring physical therapy, and possible placement but we need to rule out any occult infection like sepsis, so the patient did have blood cultures drawn. Please follow that, and we are going to place in observation and get physical therapy to see the patient and follow the blood cultures.  2. Extended spectrum beta lactamase urinary tract infection. Continue IV Invanz.  3. Type 2 diabetes mellitus. The patient is on metformin , actos and lantus.continue FSBS with coverage. 4. Hypertension. The patient's blood pressure is stable. Continue her on metoprolol and Norvasc.  5. Hyperlipidemia. Continue atorvastatin 20 mg daily.  6. Diabetic neuropathy.  Continue Neurontin and  history of overactive bladder ; Continue Myrbetriq and VESIcare.  CONDITION AT THIS TIME: Stable.   TIME SPENT: About 55 minutes on history and physical:    ____________________________ Epifanio Lesches, MD sk:lt D: 05/22/2014 15:02:31 ET T: 05/22/2014 15:18:45 ET JOB#: [id number]  cc: Epifanio Lesches, MD, <Dictator> Epifanio Lesches MD ELECTRONICALLY SIGNED 06/10/2014 14:40

## 2015-02-08 NOTE — Consult Note (Signed)
Chief Complaint:  Subjective/Chief Complaint seen for anemia and heme positive stool.  currently with mild llq discomfort, minimal nausea, no emesis, tolerating po.   VITAL SIGNS/ANCILLARY NOTES: **Vital Signs.:   23-Nov-15 14:40  Vital Signs Type Routine  Temperature Temperature (F) 99.1  Celsius 37.2  Temperature Source oral  Pulse Pulse 58  Respirations Respirations 18  Systolic BP Systolic BP 123456  Diastolic BP (mmHg) Diastolic BP (mmHg) 72  Mean BP 96  Pulse Ox % Pulse Ox % 98  Pulse Ox Activity Level  At rest  Oxygen Delivery 2L  *Intake and Output.:   23-Nov-15 09:30  Stool  soft medium stool    14:47  Stool  large putty like stool   Brief Assessment:  GEN obese   Cardiac Regular   Respiratory clear BS   Gastrointestinal details normal Nontender  Bowel sounds normal  No rebound tenderness  minimal discomfort to palpation in the llq.   Lab Results: General Ref:  21-Nov-15 04:01   Anticardiolipin ABS, IgA, IgG, IgM ========== TEST NAME ==========  ========= RESULTS =========  = REFERENCE RANGE =  ANTICARDIOLPN AB IGA,G,M  Anticardiolip Ab, IgA/G/M, Qn Anticardiolipin Ab,IgG,Qn       [   9 GPL U/mL           ]              0-14             Negative:              <15                                          Indeterminate:     15 - 20                                          Low-Med Positive: >20 - 80                                          High Positive:         >80 Anticardiolipin Ab,IgM,Qn       [   <9 MPL U/mL          ]              0-12                                          Negative:              <13                                          Indeterminate:     13 - 20   Low-Med Positive: >20 - 80  High Positive:         >80 Anticardiolipin Ab,IgA,Qn       [   <9 APL U/mL          ]              0-11                                          Negative:              <12  Indeterminate:     12 - 20                                          Low-Med Positive: >20 - 80                                          High Positive:         >80               [facility]            No: [id number] [place], [place], [place] [id number]           Lindon Romp, MD         [phone]   Result(s) reported on 09 Sep 2014 at 02:19PM.  Routine Hem:  23-Nov-15 08:10   WBC (CBC) 6.1  RBC (CBC)  3.32  Hemoglobin (CBC)  8.8  Hematocrit (CBC)  27.7  Platelet Count (CBC) 252  MCV 84  MCH 26.6  MCHC  31.8  RDW 14.5  Neutrophil % 52.4  Lymphocyte % 22.1  Monocyte % 16.1  Eosinophil % 8.7  Basophil % 0.7  Neutrophil # 3.2  Lymphocyte # 1.3  Monocyte #  1.0  Eosinophil # 0.5  Basophil # 0.0 (Result(s) reported on 09 Sep 2014 at 08:44AM.)   Assessment/Plan:  Assessment/Plan:  Assessment 1) anemia heme positive stool. improved hgb, no evidence of bleeding. EGD showing bile gastritis, colonoscopy showing short segment of ischemic colitis.  mesenteric angio without stenosis.   Plan 1) for completion of the evaluation of anemia/heme positive stool, will do sbs tomorrow am, with VCE as o/p in followup. discussed with Dr Darvin Neighbours.   Electronic Signatures: Loistine Simas (MD)  (Signed [phone] 17:23)  Authored: Chief Complaint, VITAL SIGNS/ANCILLARY NOTES, Brief Assessment, Lab Results, Assessment/Plan   Last Updated: 23-Nov-15 17:23 by Loistine Simas (MD)

## 2015-02-08 NOTE — Discharge Summary (Signed)
PATIENT NAME:  Carolyn Valdez, Carolyn Valdez MR#:  [id number] DATE OF BIRTH:  September 28, 1941  DATE OF ADMISSION:  05/17/2014 DATE OF DISCHARGE:  05/19/2014  PRIMARY CARE PHYSICIAN:  Cheral Marker. Ola Spurr, MD.  ADMITTING PHYSICIAN:  Abel Presto, MD.  DISCHARGING PHYSICIAN: Gladstone Lighter, MD.   DISCHARGE DIAGNOSES:  1.  Extended spectrum beta-lactamase Escherichia coli urinary tract infection requiring IV antibiotics.  2.  Diabetes mellitus.  3.  Iron deficiency anemia.  4.  Hypertension. 5.  Gastroesophageal reflux disease.  6.  Diabetic neuropathy.  7.  Overactive bladder.  8.  Obesity.  9.  History of breast cancer.   DISCHARGE MEDICATIONS:  1.  Allegra allergy tablets, 1 tablet p.o. daily.  2.  Aspirin 81 mg p.o. daily.  3.  Colace 100 mg 2 capsules p.o. b.i.d.  4.  Prilosec 40 mg p.o. b.i.d.  5.  Norvasc 5 mg p.o. daily.  6.  VESIcare 10 mg p.o. daily.  7.  Myrbetriq 50 mg tablet p.o. daily.  8.  Clonazepam 1 mg tablet p.o. 3 times a day.  9.  Metoprolol 100 mg p.o. b.i.d.  10. Neurontin 300 mg 1 capsule in the morning, 1 capsule in the afternoon, and 2 capsules at bedtime.  11. Atorvastatin 20 mg p.o. daily.  12. Hydroxyzine 25 mg p.o. daily in the evening.  13. Evista 60 mg p.o. daily in the evening. 14. Actos 30 mg p.o. in the morning.  15. Losartan 50 mg p.o. daily.  16. Nabumetone 500 mg p.o. b.i.d.  17. Metformin 1000 mg p.o. b.i.d.  18. DDAVP 0.2 mg p.o. daily.  19. Vitamin E 1 capsule p.o. daily.  20. Ferrex 150 mg 1 capsule p.o. 3 times a day. 21. Lasix 20 mg p.o. daily.  22. Lantus 50 units subcutaneous once a day at bedtime.  23. Invanz 1 gram IV q.24 hours for 9 days.   DISCHARGE DIET: Low-sodium, ADA 1800 calorie diet.   DISCHARGE ACTIVITY: As tolerated.   FOLLOWUP INSTRUCTIONS:  1.  Primary care physician followup in 1 week.  2.  PICC line care and flushing per protocol.  3.  Home health physical therapy nursing and nurse aide.   LABORATORY DATA AND IMAGING  STUDIES PRIOR TO DISCHARGE: Sodium 139, potassium 4.3, chloride 100, bicarbonate 34, BUN 23, creatinine 1.19, glucose 125, calcium 9.3, WBC 7.4, hemoglobin 10.3, hematocrit 30.7, platelet count 204,000.   Chest x-ray on admission after PICC line placement shows that the PICC line is in place, however, the patient has enlarged cardiac silhouette, prominent interstitial markings and diffuse osteopenia.   Urine cultures on admission growing greater than 100,000 colonies of ESBL Escherichia coli sensitive only to imipenem and ertapenem.   BRIEF HOSPITAL COURSE: Carolyn Valdez is a 74 year old obese Caucasian female with past medical history significant for diabetes, hypertension, neuropathy, osteoporosis, overactive bladder and history of breast cancer who presents to the hospital secondary to increased urinary frequency and also dysuria. The patient has had ESBL Escherichia coli in the past about 8 months ago and at that time was sent over with IV antibiotics to a rehabilitation facility. She was having urinary symptoms and urine cultures were ordered by her PCP and she was placed on Levaquin as an outpatient. However, cultures came back positive for ESBL and he was sent over to the ER.  1.  Urine cultures, 3 done at the time of admission, are growing extended spectrum beta-lactamase Escherichia coli sensitive only to carbapenem. The patient has a PICC line placed in will  need 10 days of IV Invanz. Home health has been arranged for delivery of IV antibiotics daily. The patient will follow up with that her PCP, Dr. Ola Spurr, next week.  2.  Diabetes mellitus. Sugars were well controlled in the hospital.  Her home medications of Lantus, Actos, and metformin are being continued without any changes at this time.  3.  Hypertension. The patient on losartan, metoprolol and amlodipine which are also being continued.  4.  Overactive bladder with increased urinary frequency which is chronic for the patient. She is also on  Lasix at home, which she needs. Otherwise, she has significant peripheral edema. She is also on DDAVP. Since she will be treated for urinary tract infection, hopefully the symptoms will get better. She also requested a Foley catheter just to avoid getting out of bed so many times while in the hospital. The Foley has been removed and the patient did void after the Foley was removed.   Her course has been otherwise uneventful in the hospital.   DISCHARGE CONDITION: Stable.   DISCHARGE DISPOSITION: Home with home health.   Time Spent on discharge: 40 minutes.    ____________________________ Gladstone Lighter, MD rk:lt D: 05/19/2014 10:25:07 ET T: 05/19/2014 11:31:01 ET JOB#: [id number]  cc: Gladstone Lighter, MD, <Dictator> Cheral Marker. Ola Spurr, MD Gladstone Lighter MD ELECTRONICALLY SIGNED 06/04/2014 13:53

## 2015-02-08 NOTE — H&P (Signed)
PATIENT NAME:  Carolyn Valdez, Carolyn Valdez MR#:  [id number] DATE OF BIRTH:  June 25, 1941  DATE OF ADMISSION: 05/17/2014.   PRIMARY CARE PHYSICIAN: Cheral Marker. Ola Spurr, MD.  CHIEF COMPLAINT: Urinary frequency and dysuria.   HISTORY OF PRESENT ILLNESS: This is a 74 year old female who presents to the Emergency Room due to urinary frequency and dysuria ongoing now for the past week. The patient says she developed the symptoms this past Sunday. She went to see her primary care physician and had a urinalysis and urine culture done. Her urine culture turned out to be positive for urinary tract infection and she was noted to have a multidrug resistant UTI, likely an ESBL UTI. She was sent over to the ER for further evaluation, as she was not able to take anything oral to treat this urinary tract infection. The patient does admit to generalized weakness, but no fever. No nausea. No vomiting. No abdominal pain. No diarrhea. No chest pain. No shortness of breath. No other associated symptoms presently.   REVIEW OF SYSTEMS: CONSTITUTIONAL: No documented fever. No weight gain or weight loss, positive weakness.  EYES: No blurred or double vision.  EARS, NOSE, THROAT: No tinnitus. No postnasal drip. No redness of the oropharynx.  RESPIRATORY: No cough, no wheeze, no hemoptysis, no dyspnea.  CARDIOVASCULAR: No chest pain, no orthopnea, no palpitations, no syncope.  GASTROINTESTINAL: No nausea, no vomiting, diarrhea.  No abdominal pain. No melena or hematochezia.  GENITOURINARY: Positive dysuria.  No  hematuria, positive frequency.  ENDOCRINE: No polyuria or nocturia. No heat or cold intolerance.  HEMATOLOGY: No anemia, no bruising, no bleeding.  INTEGUMENTARY: No rashes or lesions.  MUSCULOSKELETAL: No arthritis, no swelling, no gout.  NEUROLOGIC: No numbness or tingling. No ataxia. No seizure activity. PSYCHIATRIC:  Positive anxiety. No insomnia. No ADD.   PAST MEDICAL HISTORY: Consistent with diabetes, morbid obesity,  hypertension, hyperlipidemia, GERD, diabetic neuropathy, anxiety, urinary incontinence, history of ESBL UT, history of breast cancer.   ALLERGIES: MORPHINE, PREDNISONE, ROCEPHIN, SULFA DRUGS, LATEX AND TAPE.   SOCIAL HISTORY: Used to be a smoker, quit about 20+ years ago. Does have about a 15-20 year history of tobacco abuse. No alcohol abuse. No illicit drug abuse. Lives by herself.   FAMILY HISTORY: Mother and father are both deceased. Father died from lung cancer. Mother died from complications of septic shock.   CURRENT MEDICATIONS: Amlodipine 5 mg daily, VESIcare 10 mg daily, Myrbetriq 50 mg daily, Colace 100 mg 2 capsules b.i.d., Klonopin 1 mg t.i.d., metoprolol tartrate 100 mg b.i.d., Neurontin 300 mg in the morning 300 mg in afternoon and 600 mg at bedtime, Prilosec 40 mg b.i.d., Allegra 24 hour tablet, 1 tablet daily, Lipitor 20 mg daily, hydroxyzine 25 mg daily, Evista 60 mg daily, Actos 30 mg daily, Cozaar 50 mg daily, Relafen 500 mg b.i.d., Glucophage 1000 mg b.i.d., DDAVP 0.2 mg tablet 1 tablet daily, aspirin 81 mg daily, vitamin D 1 tablet daily, Ferrex 150 mg t.i.d., and Lantus 50 units at bedtime and Lasix 20 mg daily.   PHYSICAL EXAMINATION: VITAL SIGNS: Temperature is 98.6, pulse 69, respirations 19, blood pressure 167/78, saturations 97% on room air.  GENERAL: The patient is a pleasant-appearing female, but in no apparent distress.  HEENT:  She is atraumatic, normocephalic. Extraocular muscles are intact. Pupils equal and reactive to light. Sclerae anicteric. No conjunctival injection. No pharyngeal erythema.  NECK: Supple. There is no jugular venous distention. No bruits. No lymphadenopathy or thyromegaly.  HEART: Regular rate and rhythm. No murmurs,  no rubs, no clicks.  LUNGS: Clear to auscultation bilaterally. No rales, rhonchi, no wheezes.  ABDOMEN: Soft, flat, nontender, nondistended. Has good bowel sounds. No hepatosplenomegaly appreciated.  EXTREMITIES: No evidence of any  cyanosis, clubbing. Does have +1-2 pitting edema from knees to the ankles bilaterally. No cyanosis, no clubbing, +2 pedal and radial pulses bilaterally.  NEUROLOGICAL: The patient is alert, oriented x3 with no focal motor or sensory deficits appreciated bilaterally.  SKIN: Moist and warm with no rashes appreciated.  LYMPHATIC:  There is no cervical or axillary adenopathy.   LABORATORY DATA: Serum glucose of 204, BUN 22, creatinine 1.06, sodium 131, potassium 5.2, chloride 98, bicarbonate 30. The patient's white cell count 6.5, hemoglobin 10.1, hematocrit 30.9, platelet count 216,000. Urinalysis shows 1+ leukocyte esterase with 76 white cells, 1+ bacteria.   ASSESSMENT AND PLAN: This is a 74 year old female with a history of diabetes, obesity, hypertension, hyperlipidemia, gastroesophageal reflux disease, diabetic neuropathy, anxiety, history of urinary incontinence, history of extended-spectrum beta-lactamase urinary tract infection, who presented to the hospital due to urinary frequency, dysuria and also weakness. The patient had urine cultures done at the PCPs office and it showed ESBL UTI and therefore the patient is being admitted for IV antibiotics.  1.  Urinary tract infection; this is an extended-spectrum beta-lactamase urinary tract infection. Apparently, she cannot take anything oral for this urinary tract infection. She was on Macrobid prior to coming in. At this point, I will start her on IV Invanz. She likely will need the total of 10-14 days of IV antibiotic therapy. Consider getting a PICC line in the morning. She is currently afebrile and hemodynamically stable.  2.  Diabetes. Place on a carb-controlled diet. Continue her metformin, Actos and Lantus at bedtime.  3.  Hypertension presently hemodynamically stable. Continue Norvasc and metoprolol.  4.  Gastroesophageal reflux disease. Continue Protonix.  5.  Diabetic neuropathy. Continue with her Neurontin.  6.  Anxiety. Continue Klonopin.   7.  History of breast cancer: Continue Evista.   CODE STATUS:  The patient is a full code.   TIME SPENT ON ADMISSION: 50 minutes.    ____________________________ Belia Heman. Verdell Carmine, MD vjs:ds D: 05/17/2014 19:10:57 ET T: 05/17/2014 19:34:08 ET JOB#: [id number]  cc: Belia Heman. Verdell Carmine, MD, <Dictator> Henreitta Leber MD ELECTRONICALLY SIGNED 05/31/2014 14:22

## 2015-02-08 NOTE — Consult Note (Signed)
Chief Complaint:  Subjective/Chief Complaint Please see colonoscopy report.  Impression is ischemic colitis, near "watershed" area of proximal descending colon.  Biopsies taken.  I have asked for quick processing from Pathology before ordering CTA.  Will d/w Dr Boyce Medici.   Electronic Signatures: Loistine Simas (MD)  (Signed [phone] 08:55)  Authored: Chief Complaint   Last Updated: 19-Nov-15 08:55 by Loistine Simas (MD)

## 2015-02-10 LAB — SURGICAL PATHOLOGY

## 2015-02-12 NOTE — Consult Note (Signed)
PATIENT NAME:  Carolyn Valdez, BAGBY MR#:  [id number] DATE OF BIRTH:  1941-10-02  DATE OF CONSULTATION:  09/09/2014  REFERRING PHYSICIAN:  Ceasar Lund. Anselm Jungling, MD  CONSULTING PHYSICIAN:  Cheral Marker. Ola Spurr, MD  REASON FOR CONSULTATION: Urinary tract infection with ESBL producing organism.   HISTORY OF PRESENT ILLNESS: This is a 74 year old female with history of recurrent long-standing urinary tract infection, especially with increasingly resistant including ESBL-producing Escherichia coli organisms. She was admitted November 16th with weakness and found to have a hemoglobin of 7.5 as well as guaiac-positive stools. Since admission, she has had an EGD and a colonoscopy with some evidence of colonic ischemia but has stabilized with transfusion. Initially, she had minimal urinary symptoms and her urinalysis was unimpressive. However, she developed increasing symptoms and now has an impressive urinalysis and positive urine culture for ESBL Escherichia coli.   She has had a many year history of this with increasingly resistant organisms. She has been given IV ertapenem in the past for this. She has been seen by infectious disease, but has not been seen in many years by urology, despite being referred for urodynamic. She most recently had a course of ertapenem finishing August 31st of this year and had been relatively stable since then. She also takes DDAVP, VESIcare and Myrbetriq for her urinary symptoms.   PAST MEDICAL HISTORY:  1.  Recurrent urinary infections.  2.  Urinary incontinence.  3.  Anemia.  4.  IBS.  5.  Diabetes.  6.  Breast cancer.  7.  Hypertension.  8.  Hyperlipidemia.  9.  Anxiety.  10.  Osteoporosis.  11.  Neuropathy.  12.  Morbid obesity.   PAST SURGICAL HISTORY: Cholecystectomy, lumpectomy, mastectomy, shoulder surgery, right knee replacement, left knee surgery, bilateral feet operations.   SOCIAL HISTORY: She lives at home with her pet dogs. Does not smoke or drink.    FAMILY HISTORY: Noncontributory.   ALLERGIES: SHE IS ALLERGIC TO MORPHINE, PREDNISONE, ROCEPHIN, SULFA, LATEX AND TAPE.   MEDICATIONS: Reviewed in her MAR. Antibiotics since admission include ertapenem begun November 22nd and levofloxacin given November 17th and 18th.   REVIEW OF SYSTEMS: Eleven systems reviewed and negative except as per HPI.   PHYSICAL EXAMINATION:  VITAL SIGNS: Temperature 99.1, pulse 58, blood pressure 144/72, respirations 18, saturation 98% on 2 liters. She has been afebrile throughout her hospitalization.  GENERAL: She is morbidly obese, sitting on a bedside commode. No acute distress. She is quite tearful when talking about her pet [place]  and what she will do if instance she has been hospitalized so much. HEENT: Pupils equal, round and reactive to light and accommodation. Extraocular movements are intact. Sclerae are anicteric. Oropharynx clear.  NECK: Supple.  HEART: Regular.  LUNGS: Clear.  ABDOMEN: Soft, obese, nontender.  EXTREMITIES: Two plus edema bilaterally.  NEUROLOGIC: She is alert and oriented x 3. Grossly nonfocal neurological examination.   DATA: White blood count on admission was 5.8, currently it is 6.1, hemoglobin 8.8, up from 7.5, platelets 252,000. Renal function shows a creatinine of 1.45, up from 1.28, and a BUN of 16,034. LFTs were not done. A1c is 6.2. Urinalysis November 16th showed 6 white cells. Urine culture at that time grew 40,000 Escherichia coli, multi- drug-resistant except to imipenem. Followup urinalysis 6 days later, November 22nd, showed 663 white cells with greater than 100,000 Escherichia coli, pending sensitivities.   IMAGING: CT angiogram abdomen and pelvis and chest shows no evidence of mesenteric arteries ischemia, stenosis or thrombosis. No significant renal  artery stenosis is noted. The adrenal glands and kidneys appear normal. No hydronephrosis or renal obstruction is noted.   IMPRESSION: A 74 year old female with  long-standing history of urinary incontinence and recurrent urinary tract infections, as well as morbid obesity, well-controlled diabetes admitted with weakness, anemia and heme-positive stools. She has had a workup for possible mesenteric ischemia. Her initial urinalysis was unimpressive, but follow up 6 days later with some symptoms of cloudy urine has many white cells and greater than 100,000 Escherichia coli, pending sensitivities.   RECOMMENDATIONS:  1.  Continue meropenem.  2.  I will ask the laboratory to test for sensitivities for fosfomycin, although this will take several days to come back.  3.  She had an appointment scheduled with [facility] but missed with this admission. She will need followup soon after. Interestingly, her CAT scan shows no evidence of stones or other causes of urinary recurrent infections. At this point, she may benefit from a short course of ertapenem followed by potential fosfomycin if it is sensitive.   Thank you for the consult. I will be glad to follow with you.    ____________________________ Cheral Marker. Ola Spurr, MD dpf:TT D: 09/09/2014 22:20:00 ET T: 09/09/2014 22:49:14 ET JOB#: [id number]  cc: Cheral Marker. Ola Spurr, MD, <Dictator> Jeremih Dearmas Ola Spurr MD ELECTRONICALLY SIGNED 10/20/2014 21:17

## 2015-03-08 ENCOUNTER — Other Ambulatory Visit: Payer: Self-pay | Admitting: Oncology

## 2015-03-08 DIAGNOSIS — D509 Iron deficiency anemia, unspecified: Secondary | ICD-10-CM | POA: Insufficient documentation

## 2015-03-10 ENCOUNTER — Other Ambulatory Visit: Payer: Self-pay

## 2015-03-10 ENCOUNTER — Ambulatory Visit: Payer: Self-pay | Admitting: Oncology

## 2015-03-10 ENCOUNTER — Ambulatory Visit: Payer: Self-pay

## 2015-03-13 ENCOUNTER — Inpatient Hospital Stay: Payer: Medicare Other

## 2015-03-13 ENCOUNTER — Encounter (INDEPENDENT_AMBULATORY_CARE_PROVIDER_SITE_OTHER): Payer: Self-pay

## 2015-03-13 ENCOUNTER — Inpatient Hospital Stay: Payer: Medicare Other | Attending: Oncology | Admitting: Oncology

## 2015-03-13 ENCOUNTER — Encounter: Payer: Self-pay | Admitting: Oncology

## 2015-03-13 VITALS — BP 181/75 | HR 60 | Temp 96.7°F | Resp 18 | Wt 318.6 lb

## 2015-03-13 DIAGNOSIS — D509 Iron deficiency anemia, unspecified: Secondary | ICD-10-CM | POA: Insufficient documentation

## 2015-03-13 DIAGNOSIS — E780 Pure hypercholesterolemia: Secondary | ICD-10-CM | POA: Diagnosis not present

## 2015-03-13 DIAGNOSIS — Z853 Personal history of malignant neoplasm of breast: Secondary | ICD-10-CM | POA: Diagnosis not present

## 2015-03-13 DIAGNOSIS — M1990 Unspecified osteoarthritis, unspecified site: Secondary | ICD-10-CM | POA: Insufficient documentation

## 2015-03-13 DIAGNOSIS — Z794 Long term (current) use of insulin: Secondary | ICD-10-CM | POA: Insufficient documentation

## 2015-03-13 DIAGNOSIS — Z87442 Personal history of urinary calculi: Secondary | ICD-10-CM | POA: Insufficient documentation

## 2015-03-13 DIAGNOSIS — Z79899 Other long term (current) drug therapy: Secondary | ICD-10-CM | POA: Diagnosis not present

## 2015-03-13 DIAGNOSIS — E119 Type 2 diabetes mellitus without complications: Secondary | ICD-10-CM | POA: Insufficient documentation

## 2015-03-13 DIAGNOSIS — I471 Supraventricular tachycardia: Secondary | ICD-10-CM | POA: Insufficient documentation

## 2015-03-13 LAB — IRON AND TIBC
IRON: 72 ug/dL (ref 28–170)
SATURATION RATIOS: 20 % (ref 10.4–31.8)
TIBC: 364 ug/dL (ref 250–450)
UIBC: 292 ug/dL

## 2015-03-13 LAB — CBC WITH DIFFERENTIAL/PLATELET
Basophils Absolute: 0 10*3/uL (ref 0–0.1)
Basophils Relative: 1 %
Eosinophils Absolute: 0.4 10*3/uL (ref 0–0.7)
Eosinophils Relative: 6 %
HCT: 36.1 % (ref 35.0–47.0)
HEMOGLOBIN: 11.8 g/dL — AB (ref 12.0–16.0)
Lymphocytes Relative: 26 %
Lymphs Abs: 1.6 10*3/uL (ref 1.0–3.6)
MCH: 29.1 pg (ref 26.0–34.0)
MCHC: 32.7 g/dL (ref 32.0–36.0)
MCV: 89 fL (ref 80.0–100.0)
Monocytes Absolute: 0.6 10*3/uL (ref 0.2–0.9)
Monocytes Relative: 10 %
NEUTROS ABS: 3.7 10*3/uL (ref 1.4–6.5)
Neutrophils Relative %: 57 %
Platelets: 212 10*3/uL (ref 150–440)
RBC: 4.05 MIL/uL (ref 3.80–5.20)
RDW: 13.6 % (ref 11.5–14.5)
WBC: 6.4 10*3/uL (ref 3.6–11.0)

## 2015-03-13 LAB — FERRITIN: FERRITIN: 41 ng/mL (ref 11–307)

## 2015-03-25 ENCOUNTER — Telehealth: Payer: Self-pay | Admitting: Urology

## 2015-03-25 DIAGNOSIS — R32 Unspecified urinary incontinence: Secondary | ICD-10-CM

## 2015-03-25 NOTE — Telephone Encounter (Signed)
Patient called this morning about her prescription for Vesicare.  Dr. Ola Spurr was prescribing the Vesicare for her, but would like for her to start getting it from our office.  She would like it to be called into her local pharmacy:  [facility] [address] in [place], [place].

## 2015-03-26 MED ORDER — SOLIFENACIN SUCCINATE 10 MG PO TABS: 10.0000 mg | ORAL_TABLET | Freq: Every day | ORAL | Status: DC

## 2015-03-26 NOTE — Telephone Encounter (Signed)
Okay to refill? 

## 2015-03-26 NOTE — Telephone Encounter (Signed)
Vesicare Rx faxed to her pharmacy ([facility]) per Carolyn Council, PA-C.  10MG , #30, 3 refills. Carolyn Valdez has been contacted and is aware that her Rx has been sent.

## 2015-03-31 NOTE — Progress Notes (Signed)
[facility]  [phone] [phone]  ID: Carolyn Valdez OB: 1940/11/29  MR#: [id number]  [id number]  Patient Care Team: Adrian Prows, MD as PCP - General (Infectious Diseases)  CHIEF COMPLAINT:  Chief Complaint  Patient presents with  . Follow-up    IDA    INTERVAL HISTORY: Patient returns to clinic today for repeat laboratory work and further evaluation. She currently feels well and is asymptomatic. She does not complain of weakness or fatigue today. She has no neurologic complaints.  She denies any recent fevers or illnesses.  She has a good appetite and denies weight loss. She denies any chest pain or shortness of breath.  She has no nausea, vomiting, constipation, or diarrhea. She has no urinary complaints.  Patient offers no specific complaints today.   REVIEW OF SYSTEMS:   Review of Systems  Constitutional: Negative for malaise/fatigue.  Respiratory: Negative for shortness of breath.   Neurological: Negative for weakness.    As per HPI. Otherwise, a complete review of systems is negatve.  PAST MEDICAL HISTORY: Past Medical History  Diagnosis Date  . Diabetes mellitus without complication   . Breast cancer     16 yrs ago  . Hypercholesteremia   . DJD (degenerative joint disease)   . SVT (supraventricular tachycardia)   . Kidney stone   . H/O total knee replacement     right    PAST SURGICAL HISTORY: Past Surgical History  Procedure Laterality Date  . Cholecystectomy    . Tonsillectomy    . Mastectomy Right     FAMILY HISTORY: CAD, diabetes, "cancer".      ADVANCED DIRECTIVES:    HEALTH MAINTENANCE: History  Substance Use Topics  . Smoking status: Never Smoker   . Smokeless tobacco: Never Used  . Alcohol Use: No     Colonoscopy:  PAP:  Bone density:  Lipid panel:  Allergies  Allergen Reactions  . Sulfa Antibiotics Hives  . Morphine Other (See Comments)  . Ceftriaxone Anxiety  . Latex Rash   unkown  . Prednisone Rash  . Tape Rash    Current Outpatient Prescriptions  Medication Sig Dispense Refill  . amLODipine (NORVASC) 5 MG tablet     . atorvastatin (LIPITOR) 20 MG tablet     . clonazePAM (KLONOPIN) 1 MG tablet     . desmopressin (DDAVP) 0.2 MG tablet Take 0.2 mg by mouth.    . docusate sodium (COLACE) 100 MG capsule Take by mouth.    . ferrous sulfate 325 (65 FE) MG EC tablet Take 325 mg by mouth.    . fexofenadine-pseudoephedrine (ALLEGRA-D 24) 180-240 MG per 24 hr tablet Take by mouth.    . furosemide (LASIX) 20 MG tablet     . gabapentin (NEURONTIN) 300 MG capsule Take 300 mg by mouth.    Marland Kitchen glipiZIDE (GLUCOTROL XL) 10 MG 24 hr tablet Take 10 mg by mouth.    . hydrOXYzine (ATARAX/VISTARIL) 25 MG tablet Take 25 mg by mouth.    . insulin glargine (LANTUS) 100 UNIT/ML injection Inject into the skin.    Marland Kitchen losartan (COZAAR) 100 MG tablet     . metFORMIN (GLUCOPHAGE) 1000 MG tablet Take 1,000 mg by mouth.    . metoprolol succinate (TOPROL-XL) 100 MG 24 hr tablet     . mirabegron ER (MYRBETRIQ) 50 MG TB24 tablet Take by mouth.    Marland Kitchen omeprazole (PRILOSEC) 40 MG capsule     . pioglitazone (ACTOS) 30 MG tablet Take 30 mg  by mouth.    . raloxifene (EVISTA) 60 MG tablet Take 60 mg by mouth.    . vitamin E 1000 UNIT capsule Take by mouth.    . solifenacin (VESICARE) 10 MG tablet Take 1 tablet (10 mg total) by mouth daily. 30 tablet 3   No current facility-administered medications for this visit.    OBJECTIVE: Filed Vitals:   03/13/15 1654  BP: 181/75  Pulse: 60  Temp: 96.7 F (35.9 C)  Resp: 18     There is no height on file to calculate BMI.    ECOG FS:0 - Asymptomatic  General: Well-developed, well-nourished, no acute distress. Eyes: anicteric sclera. Lungs: Clear to auscultation bilaterally. Heart: Regular rate and rhythm. No rubs, murmurs, or gallops. Abdomen: Soft, nontender, nondistended. No organomegaly noted, normoactive bowel sounds. Musculoskeletal: No  edema, cyanosis, or clubbing. Neuro: Alert, answering all questions appropriately. Cranial nerves grossly intact. Skin: No rashes or petechiae noted. Psych: Normal affect.  LAB RESULTS:  Lab Results  Component Value Date   NA 137 09/08/2014   K 5.2* 09/08/2014   CL 103 09/08/2014   CO2 28 09/08/2014   GLUCOSE 211* 09/08/2014   BUN 16 09/08/2014   CREATININE 1.45* 09/08/2014   CALCIUM 8.3* 09/08/2014   PROT 7.1 05/22/2014   ALBUMIN 3.2* 05/22/2014   AST 26 05/22/2014   ALT 22 05/22/2014   ALKPHOS 62 05/22/2014   GFRNONAA 45* 07/09/2014   GFRAA 52* 07/09/2014    Lab Results  Component Value Date   WBC 6.4 03/13/2015   NEUTROABS 3.7 03/13/2015   HGB 11.8* 03/13/2015   HCT 36.1 03/13/2015   MCV 89.0 03/13/2015   PLT 212 03/13/2015     STUDIES: No results found.  ASSESSMENT: Iron deficiency anemia.  PLAN:    1.  Iron deficiency anemia: Patient's hemoglobin and iron stores have improved and she does not require additional IV iron today. She last received IV iron in January 2016.  Previously, the remainder of her anemia workup is either negative or within normal limits. Return to clinic 3 months for repeat laboratory work and further evaluation.    Patient expressed understanding and was in agreement with this plan. She also understands that She can call clinic at any time with any questions, concerns, or complaints.    Lloyd Huger, MD   03/31/2015 8:42 AM

## 2015-04-17 ENCOUNTER — Encounter: Payer: Self-pay | Admitting: Urology

## 2015-04-17 ENCOUNTER — Ambulatory Visit (INDEPENDENT_AMBULATORY_CARE_PROVIDER_SITE_OTHER): Payer: Medicare Other | Admitting: Urology

## 2015-04-17 VITALS — BP 177/73 | HR 65 | Resp 18 | Ht 67.5 in | Wt 320.4 lb

## 2015-04-17 DIAGNOSIS — R32 Unspecified urinary incontinence: Secondary | ICD-10-CM | POA: Diagnosis not present

## 2015-04-17 DIAGNOSIS — Z1612 Extended spectrum beta lactamase (ESBL) resistance: Secondary | ICD-10-CM

## 2015-04-17 DIAGNOSIS — A499 Bacterial infection, unspecified: Secondary | ICD-10-CM | POA: Insufficient documentation

## 2015-04-17 DIAGNOSIS — N390 Urinary tract infection, site not specified: Secondary | ICD-10-CM | POA: Insufficient documentation

## 2015-04-17 DIAGNOSIS — E119 Type 2 diabetes mellitus without complications: Secondary | ICD-10-CM | POA: Insufficient documentation

## 2015-04-17 DIAGNOSIS — I10 Essential (primary) hypertension: Secondary | ICD-10-CM | POA: Insufficient documentation

## 2015-04-17 NOTE — Progress Notes (Signed)
04/17/2015 3:04 PM   Carolyn Valdez 1941-02-15 [id number]  Referring provider: Adrian Prows, MD [place], [place] [address]  Chief Complaint  Patient presents with  . 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 She also suffers with urinary incontinence and vaginal atrophy complicated by her obesity.  She has been evaluated in the past by Dr. Jacqlyn Larsen, Dr. Yves Dill and now with [place] for her conditions.  She is currently on Vesicare 10 mg daily, Myrbetriq 50 mg daily, DDAVP 0.2 mg at night and vaginal estrogen cream.  She also underwent PTNS therapy and found it ineffective in controlling her incontinence.  She still continues to have incontinence and recurrent urinary tract infections.    She had contacted the office last week stating that she felt she was experiencing another urinary tract infection. She was instructed to present to the office for a catheterized specimen for urinalysis and urine culture, but she went to the urgent care instead and had a clean catch specimen obtained.  Cultures were performed on that specimen.  It resulted in a positive urine culture for Escherichia coli with multi-resistance pattern.   She was empirically started on Cipro and then when the culture results were available, she was switched to Augmentin. She then sought treatment with her primary care physician, Dr. Ola Spurr who instructed her to stop the Augmentin in preparation for the cath specimen with [place].  I reiterated to the patient the importance of presenting to our office for a catheterized specimen when she feels she has a urinary tract infections. Because of her body habitus, it is very difficult for her to obtain a clean catch specimen. I explained to her that we did not want to treat a skin contaminant. We also did not want to continue treating symptoms of dysuria or incontinence  that may not be caused by a bacterial infection. She became very upset with me and stated the reason she did not present to the office as instructed is because I was not available that day to catheterize her. I explained to her that it may not always be possible that I am available to catheterize her. It is important for her to present to the office regardless if I am available  for our staff to obtain a catheterized specimen. She will continue to risk unnecessary antibiotic exposures and increased resistance which will continue to hamper our treatment of her recurrent urinary tract infections if she does not have catheterized specimens for culture.  She stated that she understood this and in the future will contact our office for catheter specimens if she feels she is experiencing a urinary tract infection.  She currently states that she is experiencing dysuria and incontinence. She denied any chills, nausea, vomiting or gross hematuria.  She was experiencing a mild fever last week.  PMH: Past Medical History  Diagnosis Date  . Diabetes mellitus without complication   . Breast cancer     16 yrs ago  . Hypercholesteremia   . DJD (degenerative joint disease)   . SVT (supraventricular tachycardia)   . Kidney stone   . H/O total knee replacement     right    Surgical History: Past Surgical History  Procedure Laterality Date  . Cholecystectomy    . Tonsillectomy    . Mastectomy Right     Home Medications:    Medication List  This list is accurate as of: 04/17/15  3:04 PM.  Always use your most recent med list.               amLODipine 5 MG tablet  Commonly known as:  NORVASC     amoxicillin-clavulanate 500-125 MG per tablet  Commonly known as:  AUGMENTIN  Take 1 tablet by mouth 3 (three) times daily.     atorvastatin 20 MG tablet  Commonly known as:  LIPITOR     ciprofloxacin 500 MG tablet  Commonly known as:  CIPRO  Take 1 tablet by mouth 2 (two) times daily.      clonazePAM 1 MG tablet  Commonly known as:  KLONOPIN     desmopressin 0.2 MG tablet  Commonly known as:  DDAVP  Take 0.2 mg by mouth.     docusate sodium 100 MG capsule  Commonly known as:  COLACE  Take by mouth.     ferrous sulfate 325 (65 FE) MG EC tablet  Take 325 mg by mouth.     fexofenadine-pseudoephedrine 180-240 MG per 24 hr tablet  Commonly known as:  ALLEGRA-D 24  Take by mouth.     furosemide 20 MG tablet  Commonly known as:  LASIX     gabapentin 300 MG capsule  Commonly known as:  NEURONTIN  Take 300 mg by mouth.     hydrOXYzine 25 MG tablet  Commonly known as:  ATARAX/VISTARIL  Take 25 mg by mouth.     insulin glargine 100 UNIT/ML injection  Commonly known as:  LANTUS  Inject into the skin.     losartan 100 MG tablet  Commonly known as:  COZAAR     metFORMIN 1000 MG tablet  Commonly known as:  GLUCOPHAGE  Take 1,000 mg by mouth.     metoprolol succinate 100 MG 24 hr tablet  Commonly known as:  TOPROL-XL     mirabegron ER 50 MG Tb24 tablet  Commonly known as:  MYRBETRIQ  Take by mouth.     omeprazole 40 MG capsule  Commonly known as:  PRILOSEC     pioglitazone 30 MG tablet  Commonly known as:  ACTOS  Take 30 mg by mouth.     raloxifene 60 MG tablet  Commonly known as:  EVISTA  Take 60 mg by mouth.     solifenacin 10 MG tablet  Commonly known as:  VESICARE  Take 1 tablet (10 mg total) by mouth daily.     vitamin E 1000 UNIT capsule  Take by mouth.        Allergies:  Allergies  Allergen Reactions  . Sulfa Antibiotics Hives  . Biaxin [Clarithromycin]   . Eggs Or Egg-Derived Products   . Morphine Other (See Comments)  . Pyridium [Phenazopyridine Hcl]   . Ceftriaxone Anxiety  . Latex Rash    unkown  . Prednisone Rash  . Tape Rash    Family History: Family History  Problem Relation Age of Onset  . Family history unknown: Yes    Social History:  reports that she has never smoked. She has never used smokeless tobacco. She  reports that she does not drink alcohol or use illicit drugs.  ROS: Urological Symptom Review  Patient is experiencing the following symptoms: Hard to postpone urination Burning/pain with urination Get up at night to urinate Leakage of urine Urinary tract infection   Review of Systems  Gastrointestinal (upper)  : Negative for upper GI symptoms  Gastrointestinal (lower) : Diarrhea  Constitutional :  Fever Fatigue  Skin: Negative for skin symptoms  Eyes: Negative for eye symptoms  Ear/Nose/Throat : Negative for Ear/Nose/Throat symptoms  Hematologic/Lymphatic: Negative for Hematologic/Lymphatic symptoms  Cardiovascular : Negative for cardiovascular symptoms  Respiratory : Negative for respiratory symptoms  Endocrine: Negative for endocrine symptoms  Musculoskeletal: Back pain  Neurological: Negative for neurological symptoms  Psychologic: Negative for psychiatric symptoms   Physical Exam: BP 177/73 mmHg  Pulse 65  Resp 18  Ht 5' 7.5" (1.715 m)  Wt 320 lb 6.4 oz (145.332 kg)  BMI 49.41 kg/m2  GU: Atrophic external genitalia.  Normal urethral meatus. No urethral masses and/or tenderness. Labial majora and labia minora are erythematous.  Clitoral hood contains yeast. No vaginal  discharge. Internal structures could not be examined due to body habitus.  Laboratory Data:    Cath UA: Results for orders placed or performed in visit on 04/17/15  Microscopic Examination  Result Value Ref Range   WBC, UA 0-5 0 -  5 /hpf   RBC, UA 0-2 0 -  2 /hpf   Epithelial Cells (non renal) 0-10 0 - 10 /hpf   Renal Epithel, UA 0-10 (A) None seen /hpf   Bacteria, UA None seen None seen/Few  Urinalysis, Complete  Result Value Ref Range   Specific Gravity, UA 1.020 1.005 - 1.030   pH, UA 5.5 5.0 - 7.5   Color, UA Yellow Yellow   Appearance Ur Clear Clear   Leukocytes, UA Negative Negative   Protein, UA 3+ (A) Negative/Trace   Glucose, UA Negative Negative    Ketones, UA Negative Negative   RBC, UA Trace (A) Negative   Bilirubin, UA Negative Negative   Urobilinogen, Ur 0.2 0.2 - 1.0 mg/dL   Nitrite, UA Negative Negative   Microscopic Examination See below:    Lab Results  Component Value Date   WBC 6.4 03/13/2015   HGB 11.8* 03/13/2015   HCT 36.1 03/13/2015   MCV 89.0 03/13/2015   PLT 212 03/13/2015    Lab Results  Component Value Date   CREATININE 1.45* 09/08/2014    No results found for: PSA  No results found for: TESTOSTERONE  No results found for: HGBA1C  Urinalysis No results found for: COLORURINE, APPEARANCEUR, LABSPEC, PHURINE, GLUCOSEU, HGBUR, BILIRUBINUR, KETONESUR, PROTEINUR, UROBILINOGEN, NITRITE, LEUKOCYTESUR  Pertinent Imaging:  Procedure: In and Out Catheterization  Patient is present today for a I & O catheterization due to recurrent urinary tract infections. Patient was cleaned and prepped in a sterile fashion with betadine and Lidocaine 2% jelly was instilled into the urethra.  A 16 FR cath was inserted  no complications were noted ,  30 ml of urine return was noted, urine was yellow, clear in color. A clean urine sample was collected for culture.  Bladder was drained  And catheter was removed with out difficulty.    Preformed by: Zara Council, PA-C   Assessment & Plan:    1. Recurrent urinary tract infections:   I reemphasized to the patient today the importance of presenting to our office for a catheterized specimens for urinary tract infection symptoms. I also discussed with her again how her weight is contributing to her urinary issues. I will not prescribe an antibiotic at this time because of patient's history of exposure to multiple antibiotics. I will wait for urine culture and sensitivities  So, I can prescribe the appropriate antibiotic if is it indicated.  - Urinalysis, Complete  2. Incontinence:   Patient will continue her Vesicare, Myrbetriq, DDAVP and  vaginal estrogen cream.  She is  encouraged to lose weight.   No Follow-up on file.  Zara Council, [facility] [address] [place], [place] [address] [phone]

## 2015-04-18 ENCOUNTER — Other Ambulatory Visit: Payer: Medicare Other | Admitting: Urology

## 2015-04-18 DIAGNOSIS — N390 Urinary tract infection, site not specified: Secondary | ICD-10-CM

## 2015-04-18 LAB — URINALYSIS, COMPLETE
Bilirubin, UA: NEGATIVE
GLUCOSE, UA: NEGATIVE
KETONES UA: NEGATIVE
LEUKOCYTES UA: NEGATIVE
NITRITE UA: NEGATIVE
PH UA: 5.5 (ref 5.0–7.5)
Specific Gravity, UA: 1.02 (ref 1.005–1.030)
UUROB: 0.2 mg/dL (ref 0.2–1.0)

## 2015-04-18 LAB — MICROSCOPIC EXAMINATION: Bacteria, UA: NONE SEEN

## 2015-04-20 DIAGNOSIS — N390 Urinary tract infection, site not specified: Secondary | ICD-10-CM | POA: Insufficient documentation

## 2015-04-20 DIAGNOSIS — R32 Unspecified urinary incontinence: Secondary | ICD-10-CM | POA: Insufficient documentation

## 2015-04-22 ENCOUNTER — Telehealth: Payer: Self-pay

## 2015-04-22 DIAGNOSIS — N390 Urinary tract infection, site not specified: Secondary | ICD-10-CM

## 2015-04-22 LAB — CULTURE, URINE COMPREHENSIVE

## 2015-04-22 MED ORDER — AMOXICILLIN-POT CLAVULANATE 500-125 MG PO TABS: 1.0000 | ORAL_TABLET | Freq: Three times a day (TID) | ORAL | Status: AC

## 2015-04-22 NOTE — Telephone Encounter (Signed)
Spoke with pt in reference to medication. Pt voiced understanding. Pt stated she will only allow [name] to cath her. Made pt aware it is going to be a couple of weeks before [name] is going to have an available appt. Pt stated she was ok with this. Cw,lpn

## 2015-04-22 NOTE — Telephone Encounter (Signed)
Pt called asking about cx results. Made pt aware she would need augmentin 875/125. Pt stated Dr. Ola Spurr advised her not to take that much of augmentin, to only take 500mg  tid. Please advise. Cw,lpn

## 2015-04-22 NOTE — Telephone Encounter (Signed)
-----   Message from Nori Riis, PA-C sent at 04/22/2015  8:23 AM EDT ----- Patient has a +UCx.  They need to start Augmentin 875/125 one  twice daily for seven days and then we need to check a cath specimen in 3 to 5 days after they complete their antibiotics.

## 2015-04-22 NOTE — Telephone Encounter (Signed)
That's is fine.

## 2015-05-07 ENCOUNTER — Encounter: Payer: Self-pay | Admitting: *Deleted

## 2015-05-08 ENCOUNTER — Ambulatory Visit (INDEPENDENT_AMBULATORY_CARE_PROVIDER_SITE_OTHER): Payer: Medicare Other | Admitting: Urology

## 2015-05-08 ENCOUNTER — Encounter: Payer: Self-pay | Admitting: Urology

## 2015-05-08 VITALS — BP 159/71 | HR 67 | Ht 68.0 in | Wt 319.6 lb

## 2015-05-08 DIAGNOSIS — N390 Urinary tract infection, site not specified: Secondary | ICD-10-CM

## 2015-05-08 LAB — MICROSCOPIC EXAMINATION: BACTERIA UA: NONE SEEN

## 2015-05-08 LAB — URINALYSIS, COMPLETE
BILIRUBIN UA: NEGATIVE
Ketones, UA: NEGATIVE
LEUKOCYTES UA: NEGATIVE
Nitrite, UA: NEGATIVE
PH UA: 5.5 (ref 5.0–7.5)
Specific Gravity, UA: 1.025 (ref 1.005–1.030)
UUROB: 0.2 mg/dL (ref 0.2–1.0)

## 2015-05-08 NOTE — Progress Notes (Signed)
05/08/2015 2:02 PM   Estanislado Pandy [birth year] [id number]  Referring provider: Adrian Prows, MD [address] [place], [place] [address]  Chief Complaint  Patient presents with  . Follow-up    uti    HPI: Mrs. Artley is a 74 year old white female who presents today for a cath specimen for recheck after a positive urine culture.  Patient had a positive urine culture 04/22/2015 for Klebsiella pneumonia a. She was started on a regimen of Augmentin 875/125 twice daily for 7 days. She then was to return for a catheter specimen 3-5 days after she completed her antibiotics. She postponed that follow-up because I was not available at that time. I reemphasized to the patient that I may not always be available to catheterize her and how important it was for her not to delay treatment at present to the office to be catheterized whether I'm available or not.   PMH: Past Medical History  Diagnosis Date  . Diabetes mellitus without complication   . Breast cancer     16 yrs ago  . Hypercholesteremia   . DJD (degenerative joint disease)   . SVT (supraventricular tachycardia)   . Kidney stone   . H/O total knee replacement     right  . Dysuria   . HTN (hypertension)   . CHF (congestive heart failure)   . Anxiety   . Yeast vaginitis   . Vaginal atrophy   . Recurrent urinary tract infection   . Obesity   . Urinary incontinence     Surgical History: Past Surgical History  Procedure Laterality Date  . Cholecystectomy    . Tonsillectomy    . Mastectomy Right   . Vocal cords    . Foot surgery    . Cardiac catheterization    . Nasal septum surgery    . Joint replacement      Home Medications:    Medication List       This list is accurate as of: 05/08/15  2:02 PM.  Always use your most recent med list.               amLODipine 5 MG tablet  Commonly known as:  NORVASC     atorvastatin 20 MG tablet  Commonly known as:  LIPITOR     ciprofloxacin 500 MG tablet    Commonly known as:  CIPRO  Take 1 tablet by mouth 2 (two) times daily.     clonazePAM 1 MG tablet  Commonly known as:  KLONOPIN     desmopressin 0.2 MG tablet  Commonly known as:  DDAVP  Take 0.2 mg by mouth.     docusate sodium 100 MG capsule  Commonly known as:  COLACE  Take by mouth.     ferrous sulfate 325 (65 FE) MG EC tablet  Take 325 mg by mouth.     fexofenadine-pseudoephedrine 180-240 MG per 24 hr tablet  Commonly known as:  ALLEGRA-D 24  Take by mouth.     furosemide 20 MG tablet  Commonly known as:  LASIX     gabapentin 300 MG capsule  Commonly known as:  NEURONTIN  Take 300 mg by mouth.     hydrOXYzine 25 MG tablet  Commonly known as:  ATARAX/VISTARIL  Take 25 mg by mouth.     insulin glargine 100 UNIT/ML injection  Commonly known as:  LANTUS  Inject into the skin.     losartan 100 MG tablet  Commonly known as:  COZAAR     metFORMIN 1000 MG tablet  Commonly known as:  GLUCOPHAGE  Take 1,000 mg by mouth.     metoprolol succinate 100 MG 24 hr tablet  Commonly known as:  TOPROL-XL     mirabegron ER 50 MG Tb24 tablet  Commonly known as:  MYRBETRIQ  Take by mouth.     omeprazole 40 MG capsule  Commonly known as:  PRILOSEC     pioglitazone 30 MG tablet  Commonly known as:  ACTOS  Take 30 mg by mouth.     raloxifene 60 MG tablet  Commonly known as:  EVISTA  Take 60 mg by mouth.     solifenacin 10 MG tablet  Commonly known as:  VESICARE  Take 1 tablet (10 mg total) by mouth daily.     vitamin E 1000 UNIT capsule  Take by mouth.        Allergies:  Allergies  Allergen Reactions  . Sulfa Antibiotics Hives  . Biaxin [Clarithromycin]   . Eggs Or Egg-Derived Products   . Influenza A (H1n1) Monoval Vac Other (See Comments)  . Morphine Other (See Comments)  . Pyridium [Phenazopyridine Hcl]   . Ceftriaxone Anxiety  . Latex Rash    unkown  . Prednisone Rash  . Tape Rash    Family History: Family History  Problem Relation Age of  Onset  . Lung cancer Father   . Hematuria Mother   . Kidney disease Neg Hx   . Bladder Cancer Neg Hx     Social History:  reports that she has quit smoking. She has never used smokeless tobacco. She reports that she does not drink alcohol or use illicit drugs.  ROS: UROLOGY Frequent Urination?: No Hard to postpone urination?: Yes Burning/pain with urination?: No Get up at night to urinate?: Yes Leakage of urine?: Yes Urine stream starts and stops?: No Trouble starting stream?: No Do you have to strain to urinate?: No Blood in urine?: No Urinary tract infection?: No Sexually transmitted disease?: No Injury to kidneys or bladder?: No Painful intercourse?: No Weak stream?: No Currently pregnant?: No Vaginal bleeding?: No Last menstrual period?: n  Gastrointestinal Nausea?: No Vomiting?: No Indigestion/heartburn?: No Diarrhea?: No Constipation?: No  Constitutional Fever: No Night sweats?: No Weight loss?: No Fatigue?: No  Skin Skin rash/lesions?: No Itching?: No  Eyes Blurred vision?: No Double vision?: No  Ears/Nose/Throat Sore throat?: No Sinus problems?: No  Hematologic/Lymphatic Swollen glands?: No Easy bruising?: No  Cardiovascular Leg swelling?: No Chest pain?: No  Respiratory Cough?: No Shortness of breath?: No  Endocrine Excessive thirst?: No  Musculoskeletal Back pain?: No Joint pain?: No  Neurological Headaches?: No Dizziness?: No  Psychologic Depression?: No Anxiety?: No  Physical Exam: BP 159/71 mmHg  Pulse 67  Ht 5\' 8"  (1.727 m)  Wt 319 lb 9.6 oz (144.97 kg)  BMI 48.61 kg/m2  GU:  Atrophic external genitalia.  Normal urethral meatus. No urethral masses and/or tenderness. No bladder fullness or masses. No vaginal lesions or discharge. Normal rectal tone, no masses. Normal anus and perineum.   Laboratory Data: Results for orders placed or performed in visit on 05/08/15  Microscopic Examination  Result Value Ref Range    WBC, UA 0-5 0 -  5 /hpf   RBC, UA 0-2 0 -  2 /hpf   Epithelial Cells (non renal) 0-10 0 - 10 /hpf   Renal Epithel, UA 0-10 (A) None seen /hpf   Mucus, UA Present (A) Not Estab.   Bacteria,  UA None seen None seen/Few  Urinalysis, Complete  Result Value Ref Range   Specific Gravity, UA 1.025 1.005 - 1.030   pH, UA 5.5 5.0 - 7.5   Color, UA Yellow Yellow   Appearance Ur Clear Clear   Leukocytes, UA Negative Negative   Protein, UA 3+ (A) Negative/Trace   Glucose, UA Trace (A) Negative   Ketones, UA Negative Negative   RBC, UA Trace (A) Negative   Bilirubin, UA Negative Negative   Urobilinogen, Ur 0.2 0.2 - 1.0 mg/dL   Nitrite, UA Negative Negative   Microscopic Examination See below:     Lab Results  Component Value Date   WBC 6.4 03/13/2015   HGB 11.8* 03/13/2015   HCT 36.1 03/13/2015   MCV 89.0 03/13/2015   PLT 212 03/13/2015    Lab Results  Component Value Date   CREATININE 1.45* 09/08/2014    No results found for: PSA  No results found for: TESTOSTERONE  No results found for: HGBA1C  Urinalysis    Component Value Date/Time   COLORURINE Yellow 09/08/2014 1719   APPEARANCEUR Cloudy 09/08/2014 1719   LABSPEC 1.008 09/08/2014 1719   PHURINE 5.0 09/08/2014 1719   GLUCOSEU Negative 04/17/2015 1532   GLUCOSEU Negative 09/08/2014 1719   HGBUR 1+ 09/08/2014 1719   BILIRUBINUR Negative 04/17/2015 1532   BILIRUBINUR Negative 09/08/2014 1719   KETONESUR Negative 09/08/2014 1719   PROTEINUR 30 mg/dL 09/08/2014 1719   NITRITE Negative 04/17/2015 1532   NITRITE Negative 09/08/2014 1719   LEUKOCYTESUR Negative 04/17/2015 1532   LEUKOCYTESUR 3+ 09/08/2014 1719    Pertinent Imaging: Procedure: In and Out Catheterization  Patient is present today for a I & O catheterization due to recheck after positive urine culture. Patient was cleaned and prepped in a sterile fashion with betadine and Lidocaine 2% jelly was instilled into the urethra.  2 CMA's assisted me in  spreading the patient's labia apart so that I could visualize the urethra. Her vaginal mucosa was very erythematous and has yeast buildup in the Botox tissue. Because of this, her skin is very friable and when spread apart it started to bleed. A 16 FR cath was inserted no complications were noted , 70 ml of urine return was noted, urine was yellow in color. A clean urine sample was collected for culture. Bladder was drained  And catheter was removed with out difficulty.    Preformed by: Marella Chimes, PA-C  Follow up/ Additional notes: send urine for culture  Assessment & Plan:    1. Recurrent urinary tract infections: I once again reemphasized to the patient today the importance of presenting to our office for a catheterized specimens for urinary tract infection symptoms. She is not having symptoms at this time.  We will send the urine for culture.     There are no diagnoses linked to this encounter.  No Follow-up on file.  Zara Council, [facility] [address], [place], [place] [address] [phone]

## 2015-05-10 LAB — CULTURE, URINE COMPREHENSIVE

## 2015-05-12 ENCOUNTER — Telehealth: Payer: Self-pay

## 2015-05-12 NOTE — Telephone Encounter (Signed)
-----   Message from Nori Riis, PA-C sent at 05/12/2015  8:15 AM EDT ----- Please let the patient know that her urine culture is negative.

## 2015-05-12 NOTE — Telephone Encounter (Signed)
Spoke with pt in reference to complaints and needing to call PCP. Pt voiced understanding.

## 2015-05-12 NOTE — Telephone Encounter (Signed)
Patient's UCx result was "No Growth," so she does not have an UTI.  I don't believe that a vaginal yeast infection can cause chills.  I would contact her PCP for the chills to make sure she is not developing an infection elsewhere, for example an URI.

## 2015-05-12 NOTE — Telephone Encounter (Signed)
Pt called requesting urine cx results. Nurse made pt aware of negative cx. Pt c/o of pressure, frequency, chills, burning on urination. Pt states she is not using estrogen cream rx. Pt states she did get some miconazole OTC for yeast. Please advise.

## 2015-05-14 ENCOUNTER — Telehealth: Payer: Self-pay

## 2015-05-14 NOTE — Telephone Encounter (Signed)
Pt called stating she was still burning and itching in vaginal area. Pt stated she has an appt with PCP today in reference to possible yeast infection. Reinforced with pt urine cx was negative therefore there is no infection. Advised pt to go to PCP appt today as it is important. Pt voiced understanding.

## 2015-06-02 ENCOUNTER — Ambulatory Visit (INDEPENDENT_AMBULATORY_CARE_PROVIDER_SITE_OTHER): Payer: Medicare Other

## 2015-06-02 ENCOUNTER — Telehealth: Payer: Self-pay

## 2015-06-02 DIAGNOSIS — N390 Urinary tract infection, site not specified: Secondary | ICD-10-CM

## 2015-06-02 LAB — MICROSCOPIC EXAMINATION

## 2015-06-02 LAB — URINALYSIS, COMPLETE
BILIRUBIN UA: NEGATIVE
Ketones, UA: NEGATIVE
Nitrite, UA: NEGATIVE
PH UA: 5.5 (ref 5.0–7.5)
Specific Gravity, UA: 1.025 (ref 1.005–1.030)
UUROB: 0.2 mg/dL (ref 0.2–1.0)

## 2015-06-02 NOTE — Telephone Encounter (Signed)
[name] from Dr. Blane Ohara office called stating pt has called them c/o UTI s/s. Per [name] pt completed a round of abx on Friday that Dr. Ola Spurr prescribed her.  Spoke with pt in reference to UTI s/s. Pt stated she is having a lot of burning even though she just completed abx. Pt stated if she had to be cath'd then she wanted [name] to cath her. Nurse made pt aware [name] has a full schedule today and [name] would not be available to cath her. Nurse offered Herbert Moors, NP or a nurse to cath her. Pt was not happy with this. Pt agreed for nurse to cath her. Pt will come to office today for cath specimen.  [name] at Dr. Blane Ohara office has been made aware a cath specimen will be obtained today.

## 2015-06-02 NOTE — Progress Notes (Signed)
In and Out Catheterization  Patient is present today for a I & O catheterization due to possible UTI, requested from Dr. Ola Spurr. Patient was cleaned and prepped in a sterile fashion with betadine and Lidocaine 2% jelly was instilled into the urethra.  A 14FR cath was inserted no complications were noted , 48ml of urine return was noted, urine was clear and yellow in color. A clean urine sample was collected for u/a and cx. Bladder was drained  And catheter was removed with out difficulty.    Preformed by: Toniann Fail, LPN

## 2015-06-09 ENCOUNTER — Telehealth: Payer: Self-pay

## 2015-06-09 NOTE — Telephone Encounter (Signed)
Called and spoke with pt in reference to +Ucx. Made pt aware we still do not have a final results of Ucx. Nurse reinforced with pt that as soon as we have results we will give her a call. Pt voiced understanding.

## 2015-06-09 NOTE — Telephone Encounter (Signed)
-----   Message from Nori Riis, PA-C sent at 06/09/2015  1:16 PM EDT ----- Would you please call [facility] and ask if the sensitivities are available?  Or did Dr. Blane Ohara office receive them?

## 2015-06-09 NOTE — Telephone Encounter (Signed)
Spoke with Candy from [place] who stated lab is having a hard time differentiating which gram negative bacteria is growing out. Sensitivities should be here Wednesday.

## 2015-06-10 NOTE — Telephone Encounter (Signed)
Pt called inquiring about Ucx results. Made pt aware we still do not have results.  Nurse called [facility] today and Laurey Arrow stated they are not able to identify the organism, therefore they are having to get someone with more experience in. Laurey Arrow stated we should have results tomorrow-06/11/15.  Made pt aware of speaking with [facility]. Pt was unhappy but voiced understanding.

## 2015-06-11 ENCOUNTER — Telehealth: Payer: Self-pay

## 2015-06-11 LAB — CULTURE, URINE COMPREHENSIVE

## 2015-06-11 NOTE — Telephone Encounter (Signed)
UCX results are back. Please advise.

## 2015-06-11 NOTE — Telephone Encounter (Signed)
See note regarding needing another cath specimen.

## 2015-06-11 NOTE — Telephone Encounter (Signed)
-----   Message from Nori Riis, PA-C sent at 06/11/2015 10:09 AM EDT ----- Patient's urine culture results are preliminary as of this morning. Hopefully by this afternoon, we will receive the final report.

## 2015-06-12 ENCOUNTER — Telehealth: Payer: Self-pay

## 2015-06-12 ENCOUNTER — Ambulatory Visit: Payer: Medicare Other | Admitting: Oncology

## 2015-06-12 ENCOUNTER — Other Ambulatory Visit: Payer: Medicare Other

## 2015-06-12 NOTE — Telephone Encounter (Signed)
Pt called inquiring about ucx results. Made pt aware Dr. Blane Ohara office was going to be treating her.  Pt voiced understanding. Per [name] and Dr. Ola Spurr pt needs to come back in after completing 10 days of monurol. Pt will RTC for a cath specimen 06/26/15. Pt voiced understanding.

## 2015-06-12 NOTE — Telephone Encounter (Signed)
Per [name] and Dr. Ola Spurr pt will return after a 10 course of abx.

## 2015-06-12 NOTE — Telephone Encounter (Signed)
-----   Message from Nori Riis, PA-C sent at 06/11/2015  8:28 PM EDT ----- Patient will need to come in for another cath specimen.

## 2015-06-12 NOTE — Telephone Encounter (Signed)
[name] from Dr. Blane Ohara office called stating pt has been put on fosamycin. A PA is needed and has been submitted. Per [name] pt has been instructed not to take any other abx until PA is complete.

## 2015-06-18 ENCOUNTER — Inpatient Hospital Stay: Payer: Medicare Other | Admitting: Oncology

## 2015-06-18 ENCOUNTER — Inpatient Hospital Stay: Payer: Medicare Other

## 2015-06-18 LAB — ID 2

## 2015-06-19 LAB — ID AND SUSCEPTIBILITY, URINE

## 2015-06-19 LAB — SPECIMEN STATUS REPORT

## 2015-06-26 ENCOUNTER — Ambulatory Visit: Payer: Medicare Other | Admitting: Family Medicine

## 2015-06-26 DIAGNOSIS — N390 Urinary tract infection, site not specified: Secondary | ICD-10-CM

## 2015-06-30 ENCOUNTER — Inpatient Hospital Stay: Payer: Medicare Other | Attending: Oncology

## 2015-06-30 ENCOUNTER — Inpatient Hospital Stay (HOSPITAL_BASED_OUTPATIENT_CLINIC_OR_DEPARTMENT_OTHER): Payer: Medicare Other | Admitting: Oncology

## 2015-06-30 VITALS — BP 139/69 | HR 54 | Temp 97.0°F | Resp 16

## 2015-06-30 DIAGNOSIS — F419 Anxiety disorder, unspecified: Secondary | ICD-10-CM | POA: Insufficient documentation

## 2015-06-30 DIAGNOSIS — D509 Iron deficiency anemia, unspecified: Secondary | ICD-10-CM

## 2015-06-30 DIAGNOSIS — Z87440 Personal history of urinary (tract) infections: Secondary | ICD-10-CM | POA: Diagnosis not present

## 2015-06-30 DIAGNOSIS — Z87442 Personal history of urinary calculi: Secondary | ICD-10-CM | POA: Diagnosis not present

## 2015-06-30 DIAGNOSIS — E669 Obesity, unspecified: Secondary | ICD-10-CM | POA: Diagnosis not present

## 2015-06-30 DIAGNOSIS — I10 Essential (primary) hypertension: Secondary | ICD-10-CM | POA: Insufficient documentation

## 2015-06-30 DIAGNOSIS — R32 Unspecified urinary incontinence: Secondary | ICD-10-CM | POA: Insufficient documentation

## 2015-06-30 DIAGNOSIS — Z79899 Other long term (current) drug therapy: Secondary | ICD-10-CM | POA: Insufficient documentation

## 2015-06-30 DIAGNOSIS — E1165 Type 2 diabetes mellitus with hyperglycemia: Secondary | ICD-10-CM | POA: Diagnosis not present

## 2015-06-30 DIAGNOSIS — I471 Supraventricular tachycardia: Secondary | ICD-10-CM | POA: Insufficient documentation

## 2015-06-30 DIAGNOSIS — N289 Disorder of kidney and ureter, unspecified: Secondary | ICD-10-CM

## 2015-06-30 DIAGNOSIS — Z794 Long term (current) use of insulin: Secondary | ICD-10-CM

## 2015-06-30 DIAGNOSIS — E780 Pure hypercholesterolemia: Secondary | ICD-10-CM

## 2015-06-30 DIAGNOSIS — Z87891 Personal history of nicotine dependence: Secondary | ICD-10-CM | POA: Insufficient documentation

## 2015-06-30 DIAGNOSIS — M1990 Unspecified osteoarthritis, unspecified site: Secondary | ICD-10-CM | POA: Insufficient documentation

## 2015-06-30 DIAGNOSIS — Z853 Personal history of malignant neoplasm of breast: Secondary | ICD-10-CM | POA: Diagnosis not present

## 2015-06-30 LAB — CBC WITH DIFFERENTIAL/PLATELET
BASOS ABS: 0 10*3/uL (ref 0–0.1)
BASOS PCT: 1 %
EOS ABS: 0.3 10*3/uL (ref 0–0.7)
Eosinophils Relative: 5 %
HEMATOCRIT: 32.3 % — AB (ref 35.0–47.0)
HEMOGLOBIN: 10.7 g/dL — AB (ref 12.0–16.0)
Lymphocytes Relative: 25 %
Lymphs Abs: 1.5 10*3/uL (ref 1.0–3.6)
MCH: 29 pg (ref 26.0–34.0)
MCHC: 33.2 g/dL (ref 32.0–36.0)
MCV: 87.3 fL (ref 80.0–100.0)
MONO ABS: 0.7 10*3/uL (ref 0.2–0.9)
Monocytes Relative: 11 %
NEUTROS ABS: 3.4 10*3/uL (ref 1.4–6.5)
NEUTROS PCT: 58 %
Platelets: 216 10*3/uL (ref 150–440)
RBC: 3.7 MIL/uL — ABNORMAL LOW (ref 3.80–5.20)
RDW: 14.1 % (ref 11.5–14.5)
WBC: 5.9 10*3/uL (ref 3.6–11.0)

## 2015-06-30 LAB — IRON AND TIBC
IRON: 64 ug/dL (ref 28–170)
SATURATION RATIOS: 20 % (ref 10.4–31.8)
TIBC: 320 ug/dL (ref 250–450)
UIBC: 256 ug/dL

## 2015-06-30 LAB — FERRITIN: Ferritin: 47 ng/mL (ref 11–307)

## 2015-06-30 NOTE — Progress Notes (Signed)
Only concern patient offers today is chronic UTI that is being treated and followed by PCP.

## 2015-07-01 ENCOUNTER — Telehealth: Payer: Self-pay

## 2015-07-01 NOTE — Telephone Encounter (Signed)
Pt called requesting urine cx results. Made pt aware preliminary results are back but we are still waiting for sensitivities. Pt voiced concern about infection. Pt started crying and became very upset about the need for an abx. Pt stated she feels like [facility] is going to take to long in getting the sensitivities back and she is going to die. Nurse reinforced with pt that she was staying on top of ucx and was doing her best to get sensitivities. Pt voiced understanding.

## 2015-07-04 ENCOUNTER — Ambulatory Visit: Admit: 2015-07-04 | Payer: Self-pay | Admitting: Vascular Surgery

## 2015-07-04 SURGERY — PICC LINE INSERTION
Anesthesia: Moderate Sedation

## 2015-07-07 ENCOUNTER — Telehealth: Payer: Self-pay

## 2015-07-07 NOTE — Telephone Encounter (Signed)
[name] from Dr. Blane Ohara office called to make Carolyn Valdez aware of pt needing IV abx. Per [name] pt will be on IV abx until 07/21/15 or later. A week after pt completes IV therapy pt is to call for another cath u/a. PICC line will not be removed until cath cx is complete and clean.

## 2015-07-07 NOTE — Progress Notes (Signed)
[place]  [phone] [phone]  ID: Carolyn Valdez OB: April 18, 1941  MR#: [id number]  [id number]  Patient Care Team: Adrian Prows, MD as PCP - General (Infectious Diseases)  CHIEF COMPLAINT:  Chief Complaint  Patient presents with  . Anemia    INTERVAL HISTORY: Patient returns to clinic today for repeat laboratory work and further evaluation. She  Continues to feel well and is asymptomatic. She does not complain of weakness or fatigue today. She has no neurologic complaints.  She denies any recent fevers or illnesses.  She has a good appetite and denies weight loss. She denies any chest pain or shortness of breath.  She has no nausea, vomiting, constipation, or diarrhea. She has no urinary complaints.  Patient offers no specific complaints today.   REVIEW OF SYSTEMS:   Review of Systems  Constitutional: Negative for malaise/fatigue.  Respiratory: Negative.  Negative for shortness of breath.   Cardiovascular: Negative.   Gastrointestinal: Negative.  Negative for blood in stool and melena.  Neurological: Negative for weakness.    As per HPI. Otherwise, a complete review of systems is negatve.  PAST MEDICAL HISTORY: Past Medical History  Diagnosis Date  . Diabetes mellitus without complication   . Breast cancer     16 yrs ago  . Hypercholesteremia   . DJD (degenerative joint disease)   . SVT (supraventricular tachycardia)   . Kidney stone   . H/O total knee replacement     right  . Dysuria   . HTN (hypertension)   . CHF (congestive heart failure)   . Anxiety   . Yeast vaginitis   . Vaginal atrophy   . Recurrent urinary tract infection   . Obesity   . Urinary incontinence     PAST SURGICAL HISTORY: Past Surgical History  Procedure Laterality Date  . Cholecystectomy    . Tonsillectomy    . Mastectomy Right   . Vocal cords    . Foot surgery    . Cardiac catheterization    . Nasal septum surgery    . Joint  replacement      FAMILY HISTORY: CAD, diabetes, "cancer".      ADVANCED DIRECTIVES:    HEALTH MAINTENANCE: Social History  Substance Use Topics  . Smoking status: Former Research scientist (life sciences)  . Smokeless tobacco: Never Used     Comment: quit 20 years ago  . Alcohol Use: No     Colonoscopy:  PAP:  Bone density:  Lipid panel:  Allergies  Allergen Reactions  . Sulfa Antibiotics Hives  . Biaxin [Clarithromycin]   . Eggs Or Egg-Derived Products   . Influenza A (H1n1) Monoval Vac Other (See Comments)  . Morphine Other (See Comments)  . Pyridium [Phenazopyridine Hcl]   . Ceftriaxone Anxiety  . Latex Rash    unkown  . Prednisone Rash  . Tape Rash    Current Outpatient Prescriptions  Medication Sig Dispense Refill  . amLODipine (NORVASC) 5 MG tablet     . atorvastatin (LIPITOR) 20 MG tablet     . clonazePAM (KLONOPIN) 1 MG tablet     . desmopressin (DDAVP) 0.2 MG tablet Take 0.2 mg by mouth.    . docusate sodium (COLACE) 100 MG capsule Take by mouth.    . ferrous sulfate 325 (65 FE) MG EC tablet Take 325 mg by mouth.    . fexofenadine-pseudoephedrine (ALLEGRA-D 24) 180-240 MG per 24 hr tablet Take by mouth.    . furosemide (LASIX) 20 MG tablet     .  gabapentin (NEURONTIN) 300 MG capsule Take 300 mg by mouth.    . hydrOXYzine (ATARAX/VISTARIL) 25 MG tablet Take 25 mg by mouth.    . insulin glargine (LANTUS) 100 UNIT/ML injection Inject into the skin.    Marland Kitchen losartan (COZAAR) 100 MG tablet     . metFORMIN (GLUCOPHAGE) 1000 MG tablet Take 1,000 mg by mouth.    . metoprolol succinate (TOPROL-XL) 100 MG 24 hr tablet     . mirabegron ER (MYRBETRIQ) 50 MG TB24 tablet Take by mouth.    Marland Kitchen omeprazole (PRILOSEC) 40 MG capsule     . pioglitazone (ACTOS) 30 MG tablet Take 30 mg by mouth.    . raloxifene (EVISTA) 60 MG tablet Take 60 mg by mouth.    . solifenacin (VESICARE) 10 MG tablet Take 1 tablet (10 mg total) by mouth daily. 30 tablet 3  . vitamin E 1000 UNIT capsule Take by mouth.     No  current facility-administered medications for this visit.    OBJECTIVE: Filed Vitals:   06/30/15 1552  BP: 139/69  Pulse: 54  Temp: 97 F (36.1 C)  Resp: 16     There is no weight on file to calculate BMI.    ECOG FS:0 - Asymptomatic  General: Well-developed, well-nourished, no acute distress. Eyes: anicteric sclera. Lungs: Clear to auscultation bilaterally. Heart: Regular rate and rhythm. No rubs, murmurs, or gallops. Abdomen: Soft, nontender, nondistended. No organomegaly noted, normoactive bowel sounds. Musculoskeletal: No edema, cyanosis, or clubbing. Neuro: Alert, answering all questions appropriately. Cranial nerves grossly intact. Skin: No rashes or petechiae noted. Psych: Normal affect.  LAB RESULTS:  Lab Results  Component Value Date   NA 137 09/08/2014   K 5.2* 09/08/2014   CL 103 09/08/2014   CO2 28 09/08/2014   GLUCOSE 211* 09/08/2014   BUN 16 09/08/2014   CREATININE 1.45* 09/08/2014   CALCIUM 8.3* 09/08/2014   PROT 7.1 05/22/2014   ALBUMIN 3.2* 05/22/2014   AST 26 05/22/2014   ALT 22 05/22/2014   ALKPHOS 62 05/22/2014   BILITOT 0.3 05/22/2014   GFRNONAA 38* 09/08/2014   GFRAA 46* 09/08/2014    Lab Results  Component Value Date   WBC 5.9 06/30/2015   NEUTROABS 3.4 06/30/2015   HGB 10.7* 06/30/2015   HCT 32.3* 06/30/2015   MCV 87.3 06/30/2015   PLT 216 06/30/2015     STUDIES: No results found.  ASSESSMENT: Iron deficiency anemia.  PLAN:    1.  Iron deficiency anemia: Patient's hemoglobin continues to be decreased, but stable. Her iron stores are now within normal limits. She does not require additional IV iron today. She last received IV iron in January 2016.  Previously, the remainder of her anemia workup is either negative or within normal limits.  Patient does not require any further follow-up. But given her renal insufficiency, if her hemoglobin falls below 10.0 please refer her back for further evaluation and consideration of possible  Procrit. 2. Renal insufficiency:  Patient's creatinine is approximately her baseline, monitor. She may benefit from Procrit in the future if her hemoglobin falls below 10.0.  3. Hyperglycemia: Continue current diabetic medications as ordered.  Patient expressed understanding and was in agreement with this plan. She also understands that She can call clinic at any time with any questions, concerns, or complaints.    Lloyd Huger, MD   07/07/2015 12:50 PM

## 2015-07-08 ENCOUNTER — Encounter
Admission: RE | Disposition: A | Discharge status: Home / Self Care | Payer: Self-pay | Source: Ambulatory Visit | Attending: Vascular Surgery

## 2015-07-08 ENCOUNTER — Ambulatory Visit
Admission: RE | Admit: 2015-07-08 | Discharge: 2015-07-08 | Disposition: A | Discharge status: Home / Self Care | Payer: Medicare Other | Source: Ambulatory Visit | Attending: Vascular Surgery | Admitting: Vascular Surgery

## 2015-07-08 DIAGNOSIS — R32 Unspecified urinary incontinence: Secondary | ICD-10-CM | POA: Diagnosis not present

## 2015-07-08 DIAGNOSIS — N952 Postmenopausal atrophic vaginitis: Secondary | ICD-10-CM | POA: Diagnosis not present

## 2015-07-08 DIAGNOSIS — E669 Obesity, unspecified: Secondary | ICD-10-CM | POA: Diagnosis not present

## 2015-07-08 DIAGNOSIS — F419 Anxiety disorder, unspecified: Secondary | ICD-10-CM | POA: Diagnosis not present

## 2015-07-08 DIAGNOSIS — R300 Dysuria: Secondary | ICD-10-CM | POA: Insufficient documentation

## 2015-07-08 DIAGNOSIS — I509 Heart failure, unspecified: Secondary | ICD-10-CM | POA: Diagnosis not present

## 2015-07-08 DIAGNOSIS — E119 Type 2 diabetes mellitus without complications: Secondary | ICD-10-CM | POA: Diagnosis not present

## 2015-07-08 DIAGNOSIS — Z87440 Personal history of urinary (tract) infections: Secondary | ICD-10-CM | POA: Diagnosis present

## 2015-07-08 DIAGNOSIS — D509 Iron deficiency anemia, unspecified: Secondary | ICD-10-CM | POA: Insufficient documentation

## 2015-07-08 DIAGNOSIS — Z87891 Personal history of nicotine dependence: Secondary | ICD-10-CM | POA: Diagnosis not present

## 2015-07-08 DIAGNOSIS — I129 Hypertensive chronic kidney disease with stage 1 through stage 4 chronic kidney disease, or unspecified chronic kidney disease: Secondary | ICD-10-CM | POA: Insufficient documentation

## 2015-07-08 DIAGNOSIS — E780 Pure hypercholesterolemia: Secondary | ICD-10-CM | POA: Diagnosis not present

## 2015-07-08 DIAGNOSIS — Z853 Personal history of malignant neoplasm of breast: Secondary | ICD-10-CM | POA: Diagnosis not present

## 2015-07-08 DIAGNOSIS — Z87442 Personal history of urinary calculi: Secondary | ICD-10-CM | POA: Insufficient documentation

## 2015-07-08 DIAGNOSIS — I471 Supraventricular tachycardia: Secondary | ICD-10-CM | POA: Insufficient documentation

## 2015-07-08 HISTORY — PX: PERIPHERAL VASCULAR CATHETERIZATION: SHX172C

## 2015-07-08 SURGERY — PICC LINE INSERTION
Anesthesia: Moderate Sedation

## 2015-07-08 SURGICAL SUPPLY — 3 items
NDL PERC 4X21 ACCESS (NEEDLE) ×3 IMPLANT
PACK ANGIOGRAPHY (CUSTOM PROCEDURE TRAY) ×3 IMPLANT
TRAY PICC SGL PWR INJ 4FR 55CM (TRAY / TRAY PROCEDURE) ×3 IMPLANT

## 2015-07-08 NOTE — Op Note (Signed)
[facility]   OPERATIVE NOTE    PRE-OPERATIVE DIAGNOSIS: recurrent UTIs  POST-OPERATIVE DIAGNOSIS: same  PROCEDURE: 1.  1. Ultrasound guidance for vascular access to the left cephalic vein 2. Fluoroscopic guidance for placement of catheter 3. Insertion of peripherally inserted central venous catheter to the left cephalic vein  SURGEON: Leotis Pain, MD  ANESTHESIA: local  ESTIMATED BLOOD LOSS: minimal  FINDING(S): 1.  none  SPECIMEN(S):  none  INDICATIONS:   Carolyn Valdez is a 74 y.o. female who presents with recurrent resistent UTIs. We are asked to place a PICC line for long term IV access.  Risks and benefits are discussed, and informed consent was obtained.  DESCRIPTION: After obtaining full informed written consent, the patient was brought back to the vascular suite.  The patient's  left arm was sterilely prepped and draped, and a sterile surgical field was created. The left cephalic vein was accessed under direct ultrasound guidance without difficulty with a micropuncture needle and a permanent image was recorded. A 0.018 wire was then placed into the superior vena cava. Peel-away sheath was placed over the wire. A peripherally inserted central venous catheter was then placed over the wire and the wire and peel-away sheath were removed. The catheter tip was placed into the superior vena cava and was secured at the skin at 40 cm with a sterile dressing. The catheter withdrew blood well and flushed easily with heparinized saline. The patient tolerated the procedure well without complications.  COMPLICATIONS: none  CONDITION: stable  DEW,JASON  07/08/2015, 1:34 PM

## 2015-07-08 NOTE — H&P (Signed)
[facility] Admission History & Physical  MRN : [id number]  Carolyn Valdez is a 74 y.o. (09/01/1941) female who presents with chief complaint of No chief complaint on file. Marland Kitchen  History of Present Illness: Patient with recurrent UTIs, and we are asked to place a PICC line for extended IV Abx for treatment. Still having urinary symptoms.  No other complaints today  No current facility-administered medications for this encounter.    Past Medical History  Diagnosis Date  . Diabetes mellitus without complication   . Breast cancer     16 yrs ago  . Hypercholesteremia   . DJD (degenerative joint disease)   . SVT (supraventricular tachycardia)   . Kidney stone   . H/O total knee replacement     right  . Dysuria   . HTN (hypertension)   . CHF (congestive heart failure)   . Anxiety   . Yeast vaginitis   . Vaginal atrophy   . Recurrent urinary tract infection   . Obesity   . Urinary incontinence     Past Surgical History  Procedure Laterality Date  . Cholecystectomy    . Tonsillectomy    . Mastectomy Right   . Vocal cords    . Foot surgery    . Cardiac catheterization    . Nasal septum surgery    . Joint replacement      Social History Social History  Substance Use Topics  . Smoking status: Former Research scientist (life sciences)  . Smokeless tobacco: Never Used     Comment: quit 20 years ago  . Alcohol Use: No  No IVDU  Family History Family History  Problem Relation Age of Onset  . Lung cancer Father   . Hematuria Mother   . Kidney disease Neg Hx   . Bladder Cancer Neg Hx     Allergies  Allergen Reactions  . Sulfa Antibiotics Hives  . Biaxin [Clarithromycin]   . Eggs Or Egg-Derived Products   . Influenza A (H1n1) Monoval Vac Other (See Comments)  . Morphine Other (See Comments)  . Pyridium [Phenazopyridine Hcl]   . Ceftriaxone Anxiety  . Latex Rash    unkown  . Prednisone Rash  . Tape Rash     REVIEW OF SYSTEMS (Negative unless  checked)  Constitutional: [] Weight loss  [] Fever  [] Chills Cardiac: [] Chest pain   [] Chest pressure   [] Palpitations   [] Shortness of breath when laying flat   [] Shortness of breath at rest   [] Shortness of breath with exertion. Vascular:  [] Pain in legs with walking   [] Pain in legs at rest   [] Pain in legs when laying flat   [] Claudication   [] Pain in feet when walking  [] Pain in feet at rest  [] Pain in feet when laying flat   [] History of DVT   [] Phlebitis   [] Swelling in legs   [] Varicose veins   [] Non-healing ulcers Pulmonary:   [] Uses home oxygen   [] Productive cough   [] Hemoptysis   [] Wheeze  [] COPD   [] Asthma Neurologic:  [] Dizziness  [] Blackouts   [] Seizures   [] History of stroke   [] History of TIA  [] Aphasia   [] Temporary blindness   [] Dysphagia   [] Weakness or numbness in arms   [] Weakness or numbness in legs Musculoskeletal:  [] Arthritis   [] Joint swelling   [] Joint pain   [] Low back pain Hematologic:  [] Easy bruising  [] Easy bleeding   [] Hypercoagulable state   [] Anemic  [] Hepatitis Gastrointestinal:  [] Blood in stool   [] Vomiting blood  []   Gastroesophageal reflux/heartburn   [] Difficulty swallowing. Genitourinary:  [x] Chronic kidney disease   [] Difficult urination  [] Frequent urination  [x] Burning with urination   [] Blood in urine Skin:  [] Rashes   [] Ulcers   [] Wounds Psychological:  [] History of anxiety   []  History of major depression.  Physical Examination  There were no vitals filed for this visit. There is no weight on file to calculate BMI. Gen: WD/WN, NAD Head: [place]/AT, No temporalis wasting. Prominent temp pulse not noted. Ear/Nose/Throat: Hearing grossly intact, nares w/o erythema or drainage, oropharynx w/o Erythema/Exudate,  Eyes: PERRLA, EOMI.  Neck: Supple, no nuchal rigidity.  No bruit or JVD.  Pulmonary:  Good air movement, clear to auscultation bilaterally, no use of accessory muscles.  Cardiac: RRR, normal S1, S2, no Murmurs, rubs or gallops. Vascular:  Vessel  Right Left                                       Gastrointestinal: soft, non-tender/non-distended. No guarding/reflex.  Musculoskeletal: M/S 5/5 throughout.  Extremities without ischemic changes.  No deformity or atrophy.  Neurologic: CN 2-12 intact. Pain and light touch intact in extremities.  Symmetrical.  Speech is fluent. Motor exam as listed above. Psychiatric: Judgment intact, Mood & affect appropriate for pt's clinical situation. Dermatologic: No rashes or ulcers noted.  No cellulitis or open wounds. Lymph : No Cervical, Axillary, or Inguinal lymphadenopathy.   CBC Lab Results  Component Value Date   WBC 5.9 06/30/2015   HGB 10.7* 06/30/2015   HCT 32.3* 06/30/2015   MCV 87.3 06/30/2015   PLT 216 06/30/2015    BMET    Component Value Date/Time   NA 137 09/08/2014 1042   K 5.2* 09/08/2014 1042   CL 103 09/08/2014 1042   CO2 28 09/08/2014 1042   GLUCOSE 211* 09/08/2014 1042   BUN 16 09/08/2014 1042   CREATININE 1.45* 09/08/2014 1042   CALCIUM 8.3* 09/08/2014 1042   GFRNONAA 38* 09/08/2014 1042   GFRNONAA 45* 07/09/2014 1631   GFRAA 46* 09/08/2014 1042   GFRAA 52* 07/09/2014 1631   CrCl cannot be calculated (Unknown ideal weight.).  COAG Lab Results  Component Value Date   INR 1.2 08/23/2013   INR 1.3 07/25/2013    Radiology No results found.    Assessment/Plan 1. Recurrent UTIs.  Asked to place a PICC line which was done today without event 2. Iron deficiency anemia. stable 3. CKD. stable   DEW,JASON, MD  07/08/2015 1:31 PM

## 2015-07-09 ENCOUNTER — Encounter: Payer: Self-pay | Admitting: Vascular Surgery

## 2015-07-11 ENCOUNTER — Telehealth: Payer: Self-pay

## 2015-07-11 LAB — CULTURE, URINE COMPREHENSIVE

## 2015-07-11 NOTE — Telephone Encounter (Signed)
ID- she currently has a PICC and will be on abx for 2-3 weeks.

## 2015-07-11 NOTE — Telephone Encounter (Signed)
Spoke with [name], Dr. Blane Ohara nurse, this morning. [name] stated she has seen the results and printed them for the doctor.

## 2015-07-11 NOTE — Telephone Encounter (Signed)
Would you check to see if they have seen her results of the urine culture and sensitivities?

## 2015-07-11 NOTE — Telephone Encounter (Signed)
-----   Message from Nori Riis, PA-C sent at 07/11/2015  7:20 AM EDT ----- Are we treating her infection or is ID?

## 2015-08-01 ENCOUNTER — Ambulatory Visit (INDEPENDENT_AMBULATORY_CARE_PROVIDER_SITE_OTHER): Payer: Medicare Other

## 2015-08-01 DIAGNOSIS — R339 Retention of urine, unspecified: Secondary | ICD-10-CM | POA: Diagnosis not present

## 2015-08-01 DIAGNOSIS — N390 Urinary tract infection, site not specified: Secondary | ICD-10-CM

## 2015-08-01 LAB — MICROSCOPIC EXAMINATION
Renal Epithel, UA: NONE SEEN /hpf
WBC, UA: NONE SEEN /hpf (ref 0–?)

## 2015-08-01 LAB — URINALYSIS, COMPLETE
BILIRUBIN UA: NEGATIVE
GLUCOSE, UA: NEGATIVE
LEUKOCYTES UA: NEGATIVE
Nitrite, UA: NEGATIVE
Urobilinogen, Ur: 0.2 mg/dL (ref 0.2–1.0)
pH, UA: 5 (ref 5.0–7.5)

## 2015-08-01 NOTE — Progress Notes (Signed)
In and Out Catheterization  Patient is present today for a I & O catheterization due to recurrent UTI. Patient was cleaned and prepped in a sterile fashion with betadine and Lidocaine 2% jelly was instilled into the urethra.  A 14FR cath was inserted no complications were noted , 15ml of urine return was noted, urine was clear and orange in color. A clean urine sample was collected for u/a & cx. Bladder was drained  And catheter was removed with out difficulty.    Preformed by: Toniann Fail, LPN   Follow up/ Additional notes: Pt will f/u as needed once results are back.

## 2015-08-04 ENCOUNTER — Telehealth: Payer: Self-pay

## 2015-08-04 LAB — CULTURE, URINE COMPREHENSIVE

## 2015-08-04 NOTE — Telephone Encounter (Signed)
-----   Message from Roda Shutters, Rosita sent at 08/04/2015  1:49 PM EDT ----- Please notify patient that her urine culture was negative for infection. Her UA did show that her urine was highly concentrated. I recommend that she increase her fluid intake.

## 2015-08-04 NOTE — Telephone Encounter (Signed)
Spoke with pt in reference to -ucx. Pt voiced understanding. LMOM for Brandi at Dr. Blane Ohara and made aware of -ucx.

## 2015-08-21 ENCOUNTER — Ambulatory Visit (INDEPENDENT_AMBULATORY_CARE_PROVIDER_SITE_OTHER): Payer: Medicare Other

## 2015-08-21 DIAGNOSIS — N390 Urinary tract infection, site not specified: Secondary | ICD-10-CM

## 2015-08-21 LAB — URINALYSIS, COMPLETE
Bilirubin, UA: NEGATIVE
Glucose, UA: NEGATIVE
NITRITE UA: POSITIVE — AB
Specific Gravity, UA: >1.03 — ABNORMAL HIGH (ref 1.005–1.030)
Urobilinogen, Ur: 0.2 mg/dL (ref 0.2–1.0)
pH, UA: 5.5 (ref 5.0–7.5)

## 2015-08-21 LAB — MICROSCOPIC EXAMINATION
EPITHELIAL CELLS (NON RENAL): NONE SEEN /HPF (ref 0–10)
RENAL EPITHEL UA: NONE SEEN /HPF

## 2015-08-21 NOTE — Progress Notes (Signed)
In and Out Catheterization  Patient is present today for a I & O catheterization due to recurrent UTI. Patient was cleaned and prepped in a sterile fashion with betadine and Lidocaine 2% jelly was instilled into the urethra.  A 14FR cath was inserted no complications were noted , 39ml of urine return was noted, urine was clear and dark yellow in color. A clean urine sample was collected for u/a and cx. Bladder was drained  And catheter was removed with out difficulty.    Preformed by: Toniann Fail, LPN

## 2015-08-25 ENCOUNTER — Telehealth: Payer: Self-pay

## 2015-08-25 LAB — CULTURE, URINE COMPREHENSIVE

## 2015-08-25 NOTE — Telephone Encounter (Signed)
Pt called this morning requesting ucx results. Nurse made pt aware the prelim is in but still waiting for sensitivities. Pt voiced understanding and requested nurse call [name], Dr. Blane Ohara nurse. Nurse spoke with [name] in reference to present pt infection and prophylactic macrobid. [name] stated Dr. Ola Spurr will not see pt for infection unless organism is resistant to oral abx and in reference to macrobid, brandy stated medication has been called into mail order pharmacy since 08/05/15.  Nurse called pt back and made aware of what [name] stated in reference to macrobid and current infection. Pt voiced understanding.

## 2015-08-27 ENCOUNTER — Telehealth: Payer: Self-pay

## 2015-08-27 DIAGNOSIS — N390 Urinary tract infection, site not specified: Secondary | ICD-10-CM

## 2015-08-27 MED ORDER — AMOXICILLIN-POT CLAVULANATE 500-125 MG PO TABS: 1.0000 | ORAL_TABLET | Freq: Three times a day (TID) | ORAL | Status: DC

## 2015-08-27 NOTE — Telephone Encounter (Signed)
Pt called requesting results of urine cx. Per [name] augmentin bid x10days was ordered. Pt will RTC 3-5 days post abx therapy for another cath specimen. Pt voiced understanding.

## 2015-09-05 ENCOUNTER — Inpatient Hospital Stay
Admit: 2015-09-05 | Discharge: 2015-09-05 | Disposition: A | Discharge status: Home / Self Care | Payer: Medicare Other | Attending: Internal Medicine | Admitting: Internal Medicine

## 2015-09-05 ENCOUNTER — Emergency Department: Payer: Medicare Other

## 2015-09-05 ENCOUNTER — Encounter: Payer: Self-pay | Admitting: Emergency Medicine

## 2015-09-05 ENCOUNTER — Inpatient Hospital Stay
Admission: EM | Admit: 2015-09-05 | Discharge: 2015-09-09 | DRG: 291 | Disposition: A | Discharge status: Skilled Nursing Facility | Payer: Medicare Other | Attending: Specialist | Admitting: Specialist

## 2015-09-05 DIAGNOSIS — Z882 Allergy status to sulfonamides status: Secondary | ICD-10-CM | POA: Diagnosis not present

## 2015-09-05 DIAGNOSIS — Z79899 Other long term (current) drug therapy: Secondary | ICD-10-CM | POA: Diagnosis not present

## 2015-09-05 DIAGNOSIS — N183 Chronic kidney disease, stage 3 (moderate): Secondary | ICD-10-CM | POA: Diagnosis present

## 2015-09-05 DIAGNOSIS — R509 Fever, unspecified: Secondary | ICD-10-CM

## 2015-09-05 DIAGNOSIS — E7800 Pure hypercholesterolemia, unspecified: Secondary | ICD-10-CM | POA: Diagnosis present

## 2015-09-05 DIAGNOSIS — Z91040 Latex allergy status: Secondary | ICD-10-CM

## 2015-09-05 DIAGNOSIS — I509 Heart failure, unspecified: Secondary | ICD-10-CM

## 2015-09-05 DIAGNOSIS — J9601 Acute respiratory failure with hypoxia: Secondary | ICD-10-CM | POA: Diagnosis present

## 2015-09-05 DIAGNOSIS — J8410 Pulmonary fibrosis, unspecified: Secondary | ICD-10-CM | POA: Diagnosis present

## 2015-09-05 DIAGNOSIS — Z887 Allergy status to serum and vaccine status: Secondary | ICD-10-CM

## 2015-09-05 DIAGNOSIS — Z87440 Personal history of urinary (tract) infections: Secondary | ICD-10-CM

## 2015-09-05 DIAGNOSIS — I5033 Acute on chronic diastolic (congestive) heart failure: Secondary | ICD-10-CM | POA: Diagnosis present

## 2015-09-05 DIAGNOSIS — K219 Gastro-esophageal reflux disease without esophagitis: Secondary | ICD-10-CM | POA: Diagnosis present

## 2015-09-05 DIAGNOSIS — R0902 Hypoxemia: Secondary | ICD-10-CM

## 2015-09-05 DIAGNOSIS — Z96659 Presence of unspecified artificial knee joint: Secondary | ICD-10-CM | POA: Diagnosis present

## 2015-09-05 DIAGNOSIS — Z853 Personal history of malignant neoplasm of breast: Secondary | ICD-10-CM | POA: Diagnosis not present

## 2015-09-05 DIAGNOSIS — Z66 Do not resuscitate: Secondary | ICD-10-CM | POA: Diagnosis present

## 2015-09-05 DIAGNOSIS — Z7984 Long term (current) use of oral hypoglycemic drugs: Secondary | ICD-10-CM | POA: Diagnosis not present

## 2015-09-05 DIAGNOSIS — I272 Other secondary pulmonary hypertension: Secondary | ICD-10-CM | POA: Diagnosis present

## 2015-09-05 DIAGNOSIS — I130 Hypertensive heart and chronic kidney disease with heart failure and stage 1 through stage 4 chronic kidney disease, or unspecified chronic kidney disease: Secondary | ICD-10-CM | POA: Diagnosis present

## 2015-09-05 DIAGNOSIS — M1990 Unspecified osteoarthritis, unspecified site: Secondary | ICD-10-CM | POA: Diagnosis present

## 2015-09-05 DIAGNOSIS — Z6841 Body Mass Index (BMI) 40.0 and over, adult: Secondary | ICD-10-CM | POA: Diagnosis not present

## 2015-09-05 DIAGNOSIS — E785 Hyperlipidemia, unspecified: Secondary | ICD-10-CM | POA: Diagnosis present

## 2015-09-05 DIAGNOSIS — D649 Anemia, unspecified: Secondary | ICD-10-CM | POA: Diagnosis present

## 2015-09-05 DIAGNOSIS — G4730 Sleep apnea, unspecified: Secondary | ICD-10-CM | POA: Diagnosis present

## 2015-09-05 DIAGNOSIS — I248 Other forms of acute ischemic heart disease: Secondary | ICD-10-CM | POA: Diagnosis present

## 2015-09-05 DIAGNOSIS — R7989 Other specified abnormal findings of blood chemistry: Secondary | ICD-10-CM

## 2015-09-05 DIAGNOSIS — Z888 Allergy status to other drugs, medicaments and biological substances status: Secondary | ICD-10-CM | POA: Diagnosis not present

## 2015-09-05 DIAGNOSIS — E1140 Type 2 diabetes mellitus with diabetic neuropathy, unspecified: Secondary | ICD-10-CM | POA: Diagnosis present

## 2015-09-05 DIAGNOSIS — Z87891 Personal history of nicotine dependence: Secondary | ICD-10-CM | POA: Diagnosis not present

## 2015-09-05 DIAGNOSIS — Z885 Allergy status to narcotic agent status: Secondary | ICD-10-CM

## 2015-09-05 DIAGNOSIS — R609 Edema, unspecified: Secondary | ICD-10-CM

## 2015-09-05 DIAGNOSIS — R778 Other specified abnormalities of plasma proteins: Secondary | ICD-10-CM

## 2015-09-05 LAB — BASIC METABOLIC PANEL
Anion gap: 8 (ref 5–15)
BUN: 36 mg/dL — AB (ref 6–20)
CALCIUM: 9 mg/dL (ref 8.9–10.3)
CO2: 28 mmol/L (ref 22–32)
Chloride: 99 mmol/L — ABNORMAL LOW (ref 101–111)
Creatinine, Ser: 1.48 mg/dL — ABNORMAL HIGH (ref 0.44–1.00)
GFR calc Af Amer: 40 mL/min — ABNORMAL LOW (ref >60)
GFR, EST NON AFRICAN AMERICAN: 34 mL/min — AB (ref >60)
GLUCOSE: 252 mg/dL — AB (ref 65–99)
POTASSIUM: 4 mmol/L (ref 3.5–5.1)
Sodium: 135 mmol/L (ref 135–145)

## 2015-09-05 LAB — CBC WITH DIFFERENTIAL/PLATELET
BASOS ABS: 0 10*3/uL (ref 0–0.1)
Basophils Relative: 1 %
EOS PCT: 2 %
Eosinophils Absolute: 0.1 10*3/uL (ref 0–0.7)
HCT: 28.2 % — ABNORMAL LOW (ref 35.0–47.0)
Hemoglobin: 9.2 g/dL — ABNORMAL LOW (ref 12.0–16.0)
LYMPHS PCT: 16 %
Lymphs Abs: 1.2 10*3/uL (ref 1.0–3.6)
MCH: 28.4 pg (ref 26.0–34.0)
MCHC: 32.7 g/dL (ref 32.0–36.0)
MCV: 86.9 fL (ref 80.0–100.0)
MONO ABS: 0.9 10*3/uL (ref 0.2–0.9)
MONOS PCT: 12 %
Neutro Abs: 5.1 10*3/uL (ref 1.4–6.5)
Neutrophils Relative %: 69 %
Platelets: 189 10*3/uL (ref 150–440)
RBC: 3.25 MIL/uL — ABNORMAL LOW (ref 3.80–5.20)
RDW: 14.1 % (ref 11.5–14.5)
WBC: 7.3 10*3/uL (ref 3.6–11.0)

## 2015-09-05 LAB — URINALYSIS COMPLETE WITH MICROSCOPIC (ARMC ONLY)
BILIRUBIN URINE: NEGATIVE
GLUCOSE, UA: 50 mg/dL — AB
LEUKOCYTES UA: NEGATIVE
NITRITE: NEGATIVE
Protein, ur: >500 mg/dL — AB
SPECIFIC GRAVITY, URINE: 1.02 (ref 1.005–1.030)
pH: 5 (ref 5.0–8.0)

## 2015-09-05 LAB — BRAIN NATRIURETIC PEPTIDE: B NATRIURETIC PEPTIDE 5: 313 pg/mL — AB (ref 0.0–100.0)

## 2015-09-05 LAB — GLUCOSE, CAPILLARY: GLUCOSE-CAPILLARY: 182 mg/dL — AB (ref 65–99)

## 2015-09-05 LAB — TROPONIN I
TROPONIN I: 0.26 ng/mL — AB (ref <0.031)
TROPONIN I: 0.26 ng/mL — AB (ref <0.031)

## 2015-09-05 MED ORDER — SODIUM CHLORIDE 0.9 % IJ SOLN
3.0000 mL | Freq: Two times a day (BID) | INTRAMUSCULAR | Status: DC
  Administered 2015-09-05 – 2015-09-09 (×9): 3 mL via INTRAVENOUS

## 2015-09-05 MED ORDER — INSULIN ASPART 100 UNIT/ML ~~LOC~~ SOLN
0.0000 [IU] | Freq: Every day | SUBCUTANEOUS | Status: DC
  Administered 2015-09-07: 2 [IU] via SUBCUTANEOUS
  Filled 2015-09-05: qty 2

## 2015-09-05 MED ORDER — ENOXAPARIN SODIUM 40 MG/0.4ML ~~LOC~~ SOLN
40.0000 mg | SUBCUTANEOUS | Status: DC
  Administered 2015-09-05: 40 mg via SUBCUTANEOUS
  Filled 2015-09-05 (×2): qty 0.4

## 2015-09-05 MED ORDER — DESMOPRESSIN ACETATE 0.2 MG PO TABS
0.2000 mg | ORAL_TABLET | Freq: Every day | ORAL | Status: DC
  Administered 2015-09-05 – 2015-09-08 (×4): 0.2 mg via ORAL
  Filled 2015-09-05 (×5): qty 1

## 2015-09-05 MED ORDER — PANTOPRAZOLE SODIUM 40 MG PO TBEC
40.0000 mg | DELAYED_RELEASE_TABLET | Freq: Two times a day (BID) | ORAL | Status: DC
  Administered 2015-09-06 – 2015-09-09 (×7): 40 mg via ORAL
  Filled 2015-09-05 (×7): qty 1

## 2015-09-05 MED ORDER — ESTROGENS, CONJUGATED 0.625 MG/GM VA CREA
1.0000 | TOPICAL_CREAM | VAGINAL | Status: DC
  Administered 2015-09-05 – 2015-09-08 (×2): 1 via VAGINAL
  Filled 2015-09-05: qty 30

## 2015-09-05 MED ORDER — AMLODIPINE BESYLATE 5 MG PO TABS
5.0000 mg | ORAL_TABLET | Freq: Every day | ORAL | Status: DC
  Administered 2015-09-06 – 2015-09-09 (×4): 5 mg via ORAL
  Filled 2015-09-05 (×4): qty 1

## 2015-09-05 MED ORDER — ACETAMINOPHEN 650 MG RE SUPP: 650.0000 mg | Freq: Four times a day (QID) | RECTAL | Status: DC | PRN

## 2015-09-05 MED ORDER — PANTOPRAZOLE SODIUM 40 MG PO TBEC: 40.0000 mg | DELAYED_RELEASE_TABLET | Freq: Every day | ORAL | Status: DC

## 2015-09-05 MED ORDER — HEPARIN BOLUS VIA INFUSION
4000.0000 [IU] | Freq: Once | INTRAVENOUS | Status: DC
  Administered 2015-09-05: 4000 [IU] via INTRAVENOUS
  Filled 2015-09-05: qty 4000

## 2015-09-05 MED ORDER — LOSARTAN POTASSIUM 50 MG PO TABS
100.0000 mg | ORAL_TABLET | Freq: Every day | ORAL | Status: DC
  Administered 2015-09-06 – 2015-09-09 (×4): 100 mg via ORAL
  Filled 2015-09-05 (×4): qty 2

## 2015-09-05 MED ORDER — INSULIN ASPART 100 UNIT/ML ~~LOC~~ SOLN
0.0000 [IU] | Freq: Three times a day (TID) | SUBCUTANEOUS | Status: DC
  Administered 2015-09-06 (×2): 2 [IU] via SUBCUTANEOUS
  Administered 2015-09-06: 1 [IU] via SUBCUTANEOUS
  Administered 2015-09-07: 2 [IU] via SUBCUTANEOUS
  Administered 2015-09-07: 1 [IU] via SUBCUTANEOUS
  Administered 2015-09-07: 7 [IU] via SUBCUTANEOUS
  Administered 2015-09-08 (×2): 2 [IU] via SUBCUTANEOUS
  Administered 2015-09-09 (×2): 3 [IU] via SUBCUTANEOUS
  Filled 2015-09-05: qty 2
  Filled 2015-09-05: qty 3
  Filled 2015-09-05 (×2): qty 1
  Filled 2015-09-05: qty 2
  Filled 2015-09-05: qty 7
  Filled 2015-09-05: qty 3
  Filled 2015-09-05 (×3): qty 2

## 2015-09-05 MED ORDER — ASPIRIN EC 81 MG PO TBEC
81.0000 mg | DELAYED_RELEASE_TABLET | Freq: Every day | ORAL | Status: DC
  Administered 2015-09-06 – 2015-09-09 (×4): 81 mg via ORAL
  Filled 2015-09-05 (×4): qty 1

## 2015-09-05 MED ORDER — RALOXIFENE HCL 60 MG PO TABS
60.0000 mg | ORAL_TABLET | Freq: Every day | ORAL | Status: DC
  Administered 2015-09-05 – 2015-09-08 (×4): 60 mg via ORAL
  Filled 2015-09-05 (×5): qty 1

## 2015-09-05 MED ORDER — METOPROLOL SUCCINATE ER 100 MG PO TB24
100.0000 mg | ORAL_TABLET | Freq: Two times a day (BID) | ORAL | Status: DC
  Administered 2015-09-05 – 2015-09-09 (×7): 100 mg via ORAL
  Filled 2015-09-05 (×8): qty 1

## 2015-09-05 MED ORDER — ACETAMINOPHEN 325 MG PO TABS
650.0000 mg | ORAL_TABLET | Freq: Four times a day (QID) | ORAL | Status: DC | PRN
  Administered 2015-09-05 – 2015-09-07 (×4): 650 mg via ORAL
  Filled 2015-09-05 (×4): qty 2

## 2015-09-05 MED ORDER — GABAPENTIN 300 MG PO CAPS: 300.0000 mg | ORAL_CAPSULE | Freq: Three times a day (TID) | ORAL | Status: DC

## 2015-09-05 MED ORDER — GABAPENTIN 300 MG PO CAPS
600.0000 mg | ORAL_CAPSULE | Freq: Two times a day (BID) | ORAL | Status: DC
  Administered 2015-09-05 – 2015-09-09 (×8): 600 mg via ORAL
  Filled 2015-09-05 (×8): qty 2

## 2015-09-05 MED ORDER — LORATADINE 10 MG PO TABS
10.0000 mg | ORAL_TABLET | Freq: Every day | ORAL | Status: DC
  Administered 2015-09-06 – 2015-09-09 (×4): 10 mg via ORAL
  Filled 2015-09-05 (×4): qty 1

## 2015-09-05 MED ORDER — FUROSEMIDE 10 MG/ML IJ SOLN: 60.0000 mg | Freq: Two times a day (BID) | INTRAMUSCULAR | Status: DC

## 2015-09-05 MED ORDER — VITAMIN E 45 MG (100 UNIT) PO CAPS
1000.0000 [IU] | ORAL_CAPSULE | Freq: Every day | ORAL | Status: DC
  Administered 2015-09-05 – 2015-09-09 (×5): 1000 [IU] via ORAL
  Filled 2015-09-05 (×10): qty 2

## 2015-09-05 MED ORDER — FUROSEMIDE 10 MG/ML IJ SOLN
60.0000 mg | Freq: Two times a day (BID) | INTRAMUSCULAR | Status: DC
  Administered 2015-09-06 – 2015-09-07 (×3): 60 mg via INTRAVENOUS
  Filled 2015-09-05 (×4): qty 6

## 2015-09-05 MED ORDER — CLONAZEPAM 1 MG PO TABS
1.0000 mg | ORAL_TABLET | Freq: Three times a day (TID) | ORAL | Status: DC
Start: 2015-09-05 — End: 2015-09-09
  Administered 2015-09-05 – 2015-09-09 (×11): 1 mg via ORAL
  Filled 2015-09-05 (×11): qty 1

## 2015-09-05 MED ORDER — ASPIRIN 81 MG PO CHEW
324.0000 mg | CHEWABLE_TABLET | Freq: Once | ORAL | Status: AC
  Administered 2015-09-05: 324 mg via ORAL
  Filled 2015-09-05: qty 4

## 2015-09-05 MED ORDER — DOCUSATE SODIUM 100 MG PO CAPS
200.0000 mg | ORAL_CAPSULE | Freq: Two times a day (BID) | ORAL | Status: DC
  Administered 2015-09-05 – 2015-09-08 (×7): 200 mg via ORAL
  Filled 2015-09-05 (×7): qty 2

## 2015-09-05 MED ORDER — INSULIN GLARGINE 100 UNIT/ML ~~LOC~~ SOLN
50.0000 [IU] | Freq: Every day | SUBCUTANEOUS | Status: DC
  Administered 2015-09-05 – 2015-09-07 (×3): 50 [IU] via SUBCUTANEOUS
  Filled 2015-09-05 (×5): qty 0.5

## 2015-09-05 MED ORDER — HEPARIN (PORCINE) IN NACL 100-0.45 UNIT/ML-% IJ SOLN
1200.0000 [IU]/h | INTRAMUSCULAR | Status: DC
  Administered 2015-09-05: 1200 [IU]/h via INTRAVENOUS
  Filled 2015-09-05 (×2): qty 250

## 2015-09-05 MED ORDER — POLYSACCHARIDE IRON COMPLEX 150 MG PO CAPS
150.0000 mg | ORAL_CAPSULE | Freq: Every day | ORAL | Status: DC
  Administered 2015-09-06 – 2015-09-09 (×4): 150 mg via ORAL
  Filled 2015-09-05 (×4): qty 1

## 2015-09-05 MED ORDER — HYDROXYZINE HCL 25 MG PO TABS
25.0000 mg | ORAL_TABLET | Freq: Every day | ORAL | Status: DC
  Administered 2015-09-05 – 2015-09-08 (×4): 25 mg via ORAL
  Filled 2015-09-05 (×4): qty 1

## 2015-09-05 MED ORDER — FUROSEMIDE 10 MG/ML IJ SOLN
60.0000 mg | Freq: Once | INTRAMUSCULAR | Status: AC
  Administered 2015-09-05: 60 mg via INTRAVENOUS
  Filled 2015-09-05: qty 8

## 2015-09-05 MED ORDER — ATORVASTATIN CALCIUM 20 MG PO TABS
20.0000 mg | ORAL_TABLET | Freq: Every day | ORAL | Status: DC
  Administered 2015-09-05 – 2015-09-08 (×4): 20 mg via ORAL
  Filled 2015-09-05 (×5): qty 1

## 2015-09-05 MED ORDER — GABAPENTIN 300 MG PO CAPS
300.0000 mg | ORAL_CAPSULE | Freq: Every day | ORAL | Status: DC
  Administered 2015-09-06 – 2015-09-09 (×4): 300 mg via ORAL
  Filled 2015-09-05 (×4): qty 1

## 2015-09-05 NOTE — Progress Notes (Signed)
*  PRELIMINARY RESULTS* Echocardiogram 2D Echocardiogram has been performed.  Carolyn Valdez 09/05/2015, 8:54 PM

## 2015-09-05 NOTE — Progress Notes (Signed)
ANTICOAGULATION CONSULT NOTE - Initial Consult  Pharmacy Consult for Heparin Indication: chest pain/ACS  Allergies  Allergen Reactions  . Sulfa Antibiotics Hives  . Biaxin [Clarithromycin] Hives  . Eggs Or Egg-Derived Products Other (See Comments)    Pt states that her allergy test said she was allergic to eggs.    . Influenza A (H1n1) Monoval Vac Other (See Comments)    Pt states that she was told by her MD not to get the influenza vaccine.    . Morphine Other (See Comments)    Reaction:  Dizziness and confusion   . Pyridium [Phenazopyridine Hcl] Other (See Comments)    Reaction:  Unknown   . Ceftriaxone Anxiety  . Latex Rash  . Prednisone Rash  . Tape Rash    Patient Measurements: Height: 5\' 7"  (170.2 cm) Weight: (!) 315 lb (142.883 kg) IBW/kg (Calculated) : 61.6 Heparin Dosing Weight: 96.8kg  Vital Signs: Temp: 100.1 F (37.8 C) (11/18 1605) Temp Source: Oral (11/18 1605) BP: 148/72 mmHg (11/18 1605) Pulse Rate: 81 (11/18 1556)  Labs:  Recent Labs  09/05/15 1557  HGB 9.2*  HCT 28.2*  PLT 189  CREATININE 1.48*  TROPONINI 0.26*    Estimated Creatinine Clearance: 51 mL/min (by C-G formula based on Cr of 1.48).   Medical History: Past Medical History  Diagnosis Date  . Diabetes mellitus without complication ([facility])   . Breast cancer ([place])     16 yrs ago  . Hypercholesteremia   . DJD (degenerative joint disease)   . SVT (supraventricular tachycardia) ([place])   . Kidney stone   . H/O total knee replacement     right  . Dysuria   . HTN (hypertension)   . CHF (congestive heart failure) ([place])   . Anxiety   . Yeast vaginitis   . Vaginal atrophy   . Recurrent urinary tract infection   . Obesity   . Urinary incontinence     Medications:  Scheduled:  . furosemide  60 mg Intravenous Once    Assessment: AP is a 74yo female admitted for weakness and found to elevated troponin. Pharmacy consulted to dose heparin in this patient.  Goal of Therapy:   Heparin level 0.3-0.7 units/ml Monitor platelets by anticoagulation protocol: Yes   Plan:  Give 4000 units bolus x 1 Start heparin infusion at 1200 units/hr Check anti-Xa level in 8 hours and daily while on heparin Continue to monitor H&H and platelets   Check level in 8 hours at 0200 on 11/19.  Vena Rua 09/05/2015,5:16 PM

## 2015-09-05 NOTE — ED Notes (Signed)
Pt complains of sore throat, MD notified.

## 2015-09-05 NOTE — ED Notes (Signed)
Admitting MD at bedside.

## 2015-09-05 NOTE — H&P (Signed)
[facility] NAME: Carolyn Valdez    MR#:  [id number]  DATE OF BIRTH:  [birth year]  DATE OF ADMISSION:  09/05/2015  PRIMARY CARE PHYSICIAN: Adrian Prows, MD   REQUESTING/REFERRING PHYSICIAN: Lisa Roca  CHIEF COMPLAINT:   Chief Complaint  Patient presents with  . Fatigue  . Shortness of Breath    HISTORY OF PRESENT ILLNESS:  Carolyn Valdez  is a 74 y.o. female with a known history of morbid obesity, diabetes, heart failure. She presents with weakness and not feeling well. She's been sleeping for a few days. She was awake last night. She states that she had a fever and she was sweating. She recently finished Augmentin for urinary tract infection. She occasionally has some shortness of breath and cough. She occasionally has some chest pain. She has nausea. In the ER, she was found to be in acute respiratory failure with hypoxia and chest x-ray consistent with heart failure. Hospitalist services were contacted for further evaluation.  PAST MEDICAL HISTORY:   Past Medical History  Diagnosis Date  . Diabetes mellitus without complication ([place])   . Breast cancer ([place])     16 yrs ago  . Hypercholesteremia   . DJD (degenerative joint disease)   . SVT (supraventricular tachycardia) ([place])   . Kidney stone   . H/O total knee replacement     right  . Dysuria   . HTN (hypertension)   . CHF (congestive heart failure) ([place])   . Anxiety   . Yeast vaginitis   . Vaginal atrophy   . Recurrent urinary tract infection   . Obesity   . Urinary incontinence     PAST SURGICAL HISTORY:   Past Surgical History  Procedure Laterality Date  . Cholecystectomy    . Tonsillectomy    . Mastectomy Right   . Vocal cords    . Foot surgery    . Cardiac catheterization    . Nasal septum surgery    . Joint replacement    . Peripheral vascular catheterization N/A 07/08/2015    Procedure: PICC Line Insertion;  Surgeon: Algernon Huxley, MD;   Location: [facility];  Service: Cardiovascular;  Laterality: N/A;    SOCIAL HISTORY:   Social History  Substance Use Topics  . Smoking status: Former Research scientist (life sciences)  . Smokeless tobacco: Never Used     Comment: quit 20 years ago  . Alcohol Use: No    FAMILY HISTORY:   Family History  Problem Relation Age of Onset  . Lung cancer Father   . Hematuria Mother   . Kidney disease Neg Hx   . Bladder Cancer Neg Hx     DRUG ALLERGIES:   Allergies  Allergen Reactions  . Sulfa Antibiotics Hives  . Biaxin [Clarithromycin] Hives  . Eggs Or Egg-Derived Products Other (See Comments)    Pt states that her allergy test said she was allergic to eggs.    . Influenza A (H1n1) Monoval Vac Other (See Comments)    Pt states that she was told by her MD not to get the influenza vaccine.    . Morphine Other (See Comments)    Reaction:  Dizziness and confusion   . Pyridium [Phenazopyridine Hcl] Other (See Comments)    Reaction:  Unknown   . Ceftriaxone Anxiety  . Latex Rash  . Prednisone Rash  . Tape Rash    REVIEW OF SYSTEMS:  CONSTITUTIONAL: Positive for fever and sweats and weakness.  EYES: No blurred or double vision. Wears glasses. EARS, NOSE, AND THROAT: No tinnitus or ear pain. Positive for sore throat. RESPIRATORY: Positive for cough and shortness of breath, no wheezing or hemoptysis.  CARDIOVASCULAR: Occasional chest pain, positive for edema.  GASTROINTESTINAL: Positive for nausea, no vomiting, diarrhea or abdominal pain. No blood in bowel movements GENITOURINARY: No dysuria, hematuria.  ENDOCRINE: No polyuria, nocturia,  HEMATOLOGY: No anemia, easy bruising or bleeding SKIN: These infection under skin folds MUSCULOSKELETAL: Low back pain with spinal stenosis NEUROLOGIC: No tingling, numbness, weakness.  PSYCHIATRY: No anxiety or depression.   MEDICATIONS AT HOME:   Prior to Admission medications   Medication Sig Start Date End Date Taking? Authorizing Provider   amLODipine (NORVASC) 5 MG tablet Take 5 mg by mouth daily.   Yes Historical Provider, MD  atorvastatin (LIPITOR) 20 MG tablet Take 20 mg by mouth at bedtime.   Yes Historical Provider, MD  clonazePAM (KLONOPIN) 1 MG tablet Take 1 mg by mouth 3 (three) times daily.   Yes Historical Provider, MD  conjugated estrogens (PREMARIN) vaginal cream Place 1 Applicatorful vaginally 3 (three) times a week. Pt uses on Monday, Wednesday, and Friday.   Yes Historical Provider, MD  desmopressin (DDAVP) 0.2 MG tablet Take 0.2 mg by mouth at bedtime.   Yes Historical Provider, MD  docusate sodium (COLACE) 100 MG capsule Take 200 mg by mouth 2 (two) times daily.   Yes Historical Provider, MD  fexofenadine (ALLEGRA) 180 MG tablet Take 180 mg by mouth daily.   Yes Historical Provider, MD  furosemide (LASIX) 20 MG tablet Take 20 mg by mouth daily.   Yes Historical Provider, MD  gabapentin (NEURONTIN) 300 MG capsule Take 300-600 mg by mouth 3 (three) times daily. Pt takes two capsules in the morning, one capsule at lunch, and two capsules in the evening.   Yes Historical Provider, MD  hydrOXYzine (ATARAX/VISTARIL) 25 MG tablet Take 25 mg by mouth at bedtime.    Yes Historical Provider, MD  insulin glargine (LANTUS) 100 UNIT/ML injection Inject 50 Units into the skin at bedtime.   Yes Historical Provider, MD  iron polysaccharides (NIFEREX) 150 MG capsule Take 150 mg by mouth daily.   Yes Historical Provider, MD  losartan (COZAAR) 100 MG tablet Take 100 mg by mouth daily.   Yes Historical Provider, MD  metFORMIN (GLUCOPHAGE) 1000 MG tablet Take 1,000 mg by mouth 2 (two) times daily with a meal.   Yes Historical Provider, MD  metoprolol succinate (TOPROL-XL) 100 MG 24 hr tablet Take 100 mg by mouth 2 (two) times daily.    Yes Historical Provider, MD  mirabegron ER (MYRBETRIQ) 50 MG TB24 tablet Take 50 mg by mouth every evening.   Yes Historical Provider, MD  omeprazole (PRILOSEC) 40 MG capsule Take 40 mg by mouth 2 (two)  times daily.   Yes Historical Provider, MD  pioglitazone (ACTOS) 30 MG tablet Take 30 mg by mouth daily.   Yes Historical Provider, MD  raloxifene (EVISTA) 60 MG tablet Take 60 mg by mouth at bedtime.   Yes Historical Provider, MD  solifenacin (VESICARE) 10 MG tablet Take 10 mg by mouth at bedtime.   Yes Historical Provider, MD  vitamin E 1000 UNIT capsule Take 1,000 Units by mouth daily.   Yes Historical Provider, MD  amoxicillin-clavulanate (AUGMENTIN) 500-125 MG tablet Take 1 tablet (500 mg total) by mouth 3 (three) times daily. Patient not taking: Reported on 09/05/2015 08/27/15   Nori Riis, PA-C  VITAL SIGNS:  Blood pressure 160/58, pulse 72, temperature 100.1 F (37.8 C), temperature source Oral, resp. rate 19, height 5\' 7"  (1.702 m), weight 142.883 kg (315 lb), SpO2 96 %.  PHYSICAL EXAMINATION:  GENERAL:  74 y.o.-year-old patient lying in the bed with no acute distress.  EYES: Pupils equal, round, reactive to light and accommodation. No scleral icterus. Extraocular muscles intact.  HEENT: Head atraumatic, normocephalic. Oropharynx and nasopharynx clear.  NECK:  Supple, no jugular venous distention. No thyroid enlargement, no tenderness.  LUNGS: Decreased breath sounds bilaterally, no wheezing, positive for rales bilateral bases, no rhonchi or crepitation. No use of accessory muscles of respiration.  CARDIOVASCULAR: S1, S2 normal. No murmurs, rubs, or gallops.  ABDOMEN: Soft, nontender, nondistended. Bowel sounds present. No organomegaly or mass.  EXTREMITIES: 4+ edema, no cyanosis, or clubbing.  NEUROLOGIC: Cranial nerves II through XII are intact. Muscle strength 5/5 in all extremities. Sensation intact. Gait not checked.  PSYCHIATRIC: The patient is alert and oriented x 3.  SKIN: No rash, lesion, or ulcer.   LABORATORY PANEL:   CBC  Recent Labs Lab 09/05/15 1557  WBC 7.3  HGB 9.2*  HCT 28.2*  PLT 189    ------------------------------------------------------------------------------------------------------------------  Chemistries   Recent Labs Lab 09/05/15 1557  NA 135  K 4.0  CL 99*  CO2 28  GLUCOSE 252*  BUN 36*  CREATININE 1.48*  CALCIUM 9.0   ------------------------------------------------------------------------------------------------------------------  Cardiac Enzymes  Recent Labs Lab 09/05/15 1557  TROPONINI 0.26*   ------------------------------------------------------------------------------------------------------------------  RADIOLOGY:  Dg Chest Port 1 View  09/05/2015  CLINICAL DATA:  Dyspnea, weakness for 1 week, previous smoker history. History of right mastectomy. EXAM: PORTABLE CHEST 1 VIEW COMPARISON:  Chest x-ray dated 07/09/2014. FINDINGS: Cardiomegaly, moderate to severe in degree, is stable. There is at least mild central pulmonary vascular congestion and bilateral interstitial edema suggesting volume overload/CHF, likely on a background of chronic pulmonary artery hypertension and fibrosis. No confluent opacity to suggest a developing pneumonia. No large pleural effusion. No pneumothorax. IMPRESSION: Findings suggest mild volume overload/CHF, likely on a background of chronic fibrosis and pulmonary artery hypertension. Cardiomegaly, moderate to severe in degree, is stable. Electronically Signed   By: Franki Cabot M.D.   On: 09/05/2015 17:06    EKG:   Sinus rhythm 79 bpm flipped T waves V4 through 6 and inferiorly  IMPRESSION AND PLAN:   1. Acute respiratory failure with hypoxia- pulse ox 78% on room air when EMS got to her. Oxygen supplementation provided. 2. Acute congestive heart failure. I will get an echocardiogram to determine ejection fraction. I will give Lasix 60 mg IV twice a day. Patient is on losartan and metoprolol. Stop Actos. 3. Morbid obesity- weight loss needed 4. Type 2 diabetes without complication- continue Lantus and sliding  scale 5. Essential hypertension- continue usual medications 6. History of breast cancer 7. Hyperlipidemia unspecified continue atorvastatin 8. Elevated troponin likely demand ischemia with acute respiratory failure and heart failure. Trend heart enzymes and monitor on telemetry. Continue aspirin. 9. Anemia unspecified 10. Chronic kidney disease stage III- monitor with diuresis  All the records are reviewed and case discussed with ED provider. Management plans discussed with the patient, family and they are in agreement.  CODE STATUS: DO NOT RESUSCITATE  TOTAL TIME TAKING CARE OF THIS PATIENT: 55 minutes.    Loletha Grayer M.D on 09/05/2015 at 6:03 PM  Between 7am to 6pm - Pager - [phone]  After 6pm call admission pager [facility]  Office  [phone]  CC: Primary care physician; Adrian Prows, MD

## 2015-09-05 NOTE — ED Notes (Addendum)
Pt comes from home via ems with complaints of weakness and shortness of breath. Pt 78% on RA on arrival, put on 3L. Pt 97% now. Pt has hx of UTI's and was supposed To finish her last antibiotic today. Pt has increased weakness since earlier this week. Bilateral swelling to lower extremities noted.

## 2015-09-05 NOTE — ED Provider Notes (Signed)
[facility] Emergency Department Provider Note   ____________________________________________  Time seen: On EMS arrival I have reviewed the triage vital signs and the triage nursing note.  HISTORY  Chief Complaint Fatigue and Shortness of Breath   Historian Patient  HPI Carolyn Valdez is a 74 y.o. female is here for evaluation by EMS for shortness of breath for couple days and generalized weakness for couple days. Patient states she does not have a history of congestive heart failure, however on her past medical history listed does list congestive heart failure and she does take Lasix. Patient states that she has not taken Lasix several of the last couple of days because she was feeling weak and didn't think she did get up to the bathroom. Now she is short of breath, and when EMS got to her she was 78% on room air. She does not typically take oxygen normally. She has had a recent urinary tract infection and today was to be her last day of antibiotic. No fevers. No vomiting. No abdominal pain. The urinary burning is now gone.    Past Medical History  Diagnosis Date  . Diabetes mellitus without complication ([place])   . Breast cancer ([place])     16 yrs ago  . Hypercholesteremia   . DJD (degenerative joint disease)   . SVT (supraventricular tachycardia) ([place])   . Kidney stone   . H/O total knee replacement     right  . Dysuria   . HTN (hypertension)   . CHF (congestive heart failure) ([place])   . Anxiety   . Yeast vaginitis   . Vaginal atrophy   . Recurrent urinary tract infection   . Obesity   . Urinary incontinence     Patient Active Problem List   Diagnosis Date Noted  . Acute respiratory failure with hypoxia ([place]) 09/05/2015  . 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 Incontinence 04/20/2015  . Diabetes mellitus, type 2 ([place]) 04/17/2015  . ESBL (extended spectrum beta-lactamase) producing bacteria infection 04/17/2015  . BP (high blood pressure) 04/17/2015  .  Frequent UTI 04/17/2015  . Absence of bladder continence 04/17/2015  . Iron deficiency anemia 03/08/2015  . Absolute anemia 09/25/2014  . Abdominal pain, lower 09/25/2014  . Urge incontinence 02/19/2013  . FOM (frequency of micturition) 02/19/2013  . Bladder infection, chronic 08/11/2012  . Difficult or painful urination 07/26/2012  . Lower urinary tract infection 12/31/2011    Past Surgical History  Procedure Laterality Date  . Cholecystectomy    . Tonsillectomy    . Mastectomy Right   . Vocal cords    . Foot surgery    . Cardiac catheterization    . Nasal septum surgery    . Joint replacement    . Peripheral vascular catheterization N/A 07/08/2015    Procedure: PICC Line Insertion;  Surgeon: Algernon Huxley, MD;  Location: [facility];  Service: Cardiovascular;  Laterality: N/A;    Current Outpatient Rx  Name  Route  Sig  Dispense  Refill  . amLODipine (NORVASC) 5 MG tablet   Oral   Take 5 mg by mouth daily.         Marland Kitchen atorvastatin (LIPITOR) 20 MG tablet   Oral   Take 20 mg by mouth at bedtime.         . clonazePAM (KLONOPIN) 1 MG tablet   Oral   Take 1 mg by mouth 3 (three) times daily.         Marland Kitchen conjugated  estrogens (PREMARIN) vaginal cream   Vaginal   Place 1 Applicatorful vaginally 3 (three) times a week. Pt uses on Monday, Wednesday, and Friday.         . desmopressin (DDAVP) 0.2 MG tablet   Oral   Take 0.2 mg by mouth at bedtime.         . docusate sodium (COLACE) 100 MG capsule   Oral   Take 200 mg by mouth 2 (two) times daily.         . fexofenadine (ALLEGRA) 180 MG tablet   Oral   Take 180 mg by mouth daily.         . furosemide (LASIX) 20 MG tablet   Oral   Take 20 mg by mouth daily.         Marland Kitchen gabapentin (NEURONTIN) 300 MG capsule   Oral   Take 300-600 mg by mouth 3 (three) times daily. Pt takes two capsules in the morning, one capsule at lunch, and two capsules in the evening.         . hydrOXYzine (ATARAX/VISTARIL) 25  MG tablet   Oral   Take 25 mg by mouth at bedtime.          . insulin glargine (LANTUS) 100 UNIT/ML injection   Subcutaneous   Inject 50 Units into the skin at bedtime.         . iron polysaccharides (NIFEREX) 150 MG capsule   Oral   Take 150 mg by mouth daily.         Marland Kitchen losartan (COZAAR) 100 MG tablet   Oral   Take 100 mg by mouth daily.         . metFORMIN (GLUCOPHAGE) 1000 MG tablet   Oral   Take 1,000 mg by mouth 2 (two) times daily with a meal.         . metoprolol succinate (TOPROL-XL) 100 MG 24 hr tablet   Oral   Take 100 mg by mouth 2 (two) times daily.          . mirabegron ER (MYRBETRIQ) 50 MG TB24 tablet   Oral   Take 50 mg by mouth every evening.         Marland Kitchen omeprazole (PRILOSEC) 40 MG capsule   Oral   Take 40 mg by mouth 2 (two) times daily.         . pioglitazone (ACTOS) 30 MG tablet   Oral   Take 30 mg by mouth daily.         . raloxifene (EVISTA) 60 MG tablet   Oral   Take 60 mg by mouth at bedtime.         . solifenacin (VESICARE) 10 MG tablet   Oral   Take 10 mg by mouth at bedtime.         . vitamin E 1000 UNIT capsule   Oral   Take 1,000 Units by mouth daily.         Marland Kitchen amoxicillin-clavulanate (AUGMENTIN) 500-125 MG tablet   Oral   Take 1 tablet (500 mg total) by mouth 3 (three) times daily. Patient not taking: Reported on 09/05/2015   30 tablet   0     Allergies Sulfa antibiotics; Biaxin; Eggs or egg-derived products; Influenza a (h1n1) monoval vac; Morphine; Pyridium; Ceftriaxone; Latex; Prednisone; and Tape  Family History  Problem Relation Age of Onset  . Lung cancer Father   . Hematuria Mother   . Kidney disease Neg Hx   .  Bladder Cancer Neg Hx     Social History Social History  Substance Use Topics  . Smoking status: Former Research scientist (life sciences)  . Smokeless tobacco: Never Used     Comment: quit 20 years ago  . Alcohol Use: No    Review of Systems  Constitutional: Negative for fever. Eyes: Negative for  visual changes. ENT: Negative for sore throat. Cardiovascular: Negative for chest pain. Respiratory: Positive for shortness of breath. Gastrointestinal: Negative for abdominal pain, vomiting and diarrhea. Genitourinary: Negative for dysuria. Musculoskeletal: Negative for back pain. Positive for leg swelling. Skin: Negative for rash. Neurological: Negative for headache. 10 point Review of Systems otherwise negative ____________________________________________   PHYSICAL EXAM:  VITAL SIGNS: ED Triage Vitals  Enc Vitals Group     BP 09/05/15 1605 148/72 mmHg     Pulse Rate 09/05/15 1556 81     Resp 09/05/15 1556 20     Temp 09/05/15 1605 100.1 F (37.8 C)     Temp Source 09/05/15 1605 Oral     SpO2 09/05/15 1556 90 %     Weight 09/05/15 1605 315 lb (142.883 kg)     Height 09/05/15 1605 5\' 7"  (1.702 m)     Head Cir --      Peak Flow --      Pain Score 09/05/15 1607 0     Pain Loc --      Pain Edu? --      Excl. in [place]? --      Constitutional: Alert and oriented. Well appearing and in no distress. Eyes: Conjunctivae are normal. PERRL. Normal extraocular movements. ENT   Head: Normocephalic and atraumatic.   Nose: No congestion/rhinnorhea.   Mouth/Throat: Mucous membranes are moist.   Neck: No stridor. Cardiovascular/Chest: Normal rate, regular rhythm.  No murmurs, rubs, or gallops. Respiratory: Normal respiratory effort without tachypnea nor retractions. Decreased breath sounds throughout all lung fields. No wheezing. No rales or rhonchi.  Gastrointestinal: Soft. No distention, no guarding, no rebound. Nontender, obese.  Genitourinary/rectal:Deferred Musculoskeletal: Nontender with normal range of motion in all extremities. No joint effusions.  No lower extremity tenderness.  4+ lower extremity pitting edema bilateral lower extremities. Neurologic:  Normal speech and language. No gross or focal neurologic deficits are appreciated. Skin:  Skin is warm, dry and  intact. No rash noted. Psychiatric: Mood and affect are normal. Speech and behavior are normal. Patient exhibits appropriate insight and judgment.  ____________________________________________   EKG I, Lisa Roca, MD, the attending physician have personally viewed and interpreted all ECGs.  79 bpm. Sinus rhythm. Narrow QRS. Normal axis. ST segment depression inferiorly and laterally. T-wave inversions anteriorly and laterally. ____________________________________________  LABS (pertinent positives/negatives)  Urinalysis trace ketones, a few bacteria, otherwise negative Basic metabolic panel significant for BUN 36 and creatinine 1.48 with glucose 252 White blood count 7.3, hemoglobin 9.2 and platelet count 189 Troponin 0.26 BNP 313  ____________________________________________  RADIOLOGY All Xrays were viewed by me. Imaging interpreted by Radiologist.  Chest x-ray:  IMPRESSION: Findings suggest mild volume overload/CHF, likely on a background of chronic fibrosis and pulmonary artery hypertension.  Cardiomegaly, moderate to severe in degree, is stable. __________________________________________  PROCEDURES  Procedure(s) performed: None  Critical Care performed: CRITICAL CARE Performed by: Lisa Roca   Total critical care time: 30 minutes  Critical care time was exclusive of separately billable procedures and treating other patients.  Critical care was necessary to treat or prevent imminent or life-threatening deterioration.  Critical care was time spent personally by me on  the following activities: development of treatment plan with patient and/or surrogate as well as nursing, discussions with consultants, evaluation of patient's response to treatment, examination of patient, obtaining history from patient or surrogate, ordering and performing treatments and interventions, ordering and review of laboratory studies, ordering and review of radiographic studies, pulse  oximetry and re-evaluation of patient's condition.   ____________________________________________   ED COURSE / ASSESSMENT AND PLAN  CONSULTATIONS: Hospitalist for admission  Pertinent labs & imaging results that were available during my care of the patient were reviewed by me and considered in my medical decision making (see chart for details).  Clinically patient appears to be in CHF exacerbation with increased lower extremity edema as well as shortness of breath with hypoxia, and history of not taking her Lasix.  Patient was given IV Lasix to help with volume overload and suspected acute pulmonary edema causing her hypoxia. Patient is stabilized on 2 L nasal cannula O2.  EKG showed T-wave inversions which are new, and her troponin came back elevated 0.26, patient was given aspirin as well as IV  heparin for possible and STEMI versus cardiac strain due to CHF.  Patient / Family / Caregiver informed of clinical course, medical decision-making process, and agree with plan.   I discussed return precautions, follow-up instructions, and discharged instructions with patient and/or family.  ___________________________________________   FINAL CLINICAL IMPRESSION(S) / ED DIAGNOSES   Final diagnoses:  Hypoxia  Acute on chronic congestive heart failure, unspecified congestive heart failure type ([place])  Troponin level elevated       Lisa Roca, MD 09/05/15 1810

## 2015-09-05 NOTE — ED Notes (Signed)
RN spoke to MD [facility] who verbalized to d/c heparin infusion at this time. Heparin infusion stopped. Pt made aware and verbalized understanding.

## 2015-09-06 LAB — GLUCOSE, CAPILLARY
GLUCOSE-CAPILLARY: 178 mg/dL — AB (ref 65–99)
GLUCOSE-CAPILLARY: 145 mg/dL — AB (ref 65–99)
GLUCOSE-CAPILLARY: 144 mg/dL — AB (ref 65–99)
Glucose-Capillary: 180 mg/dL — ABNORMAL HIGH (ref 65–99)

## 2015-09-06 LAB — BASIC METABOLIC PANEL
ANION GAP: 8 (ref 5–15)
BUN: 32 mg/dL — ABNORMAL HIGH (ref 6–20)
CALCIUM: 8.9 mg/dL (ref 8.9–10.3)
CHLORIDE: 102 mmol/L (ref 101–111)
CO2: 27 mmol/L (ref 22–32)
Creatinine, Ser: 1.39 mg/dL — ABNORMAL HIGH (ref 0.44–1.00)
GFR calc non Af Amer: 37 mL/min — ABNORMAL LOW (ref >60)
GFR, EST AFRICAN AMERICAN: 43 mL/min — AB (ref >60)
GLUCOSE: 219 mg/dL — AB (ref 65–99)
Potassium: 3.8 mmol/L (ref 3.5–5.1)
Sodium: 137 mmol/L (ref 135–145)

## 2015-09-06 LAB — LIPID PANEL
CHOLESTEROL: 159 mg/dL (ref 0–200)
HDL: 32 mg/dL — ABNORMAL LOW (ref >40)
LDL Cholesterol: 97 mg/dL (ref 0–99)
TRIGLYCERIDES: 152 mg/dL — AB (ref <150)
Total CHOL/HDL Ratio: 5 RATIO
VLDL: 30 mg/dL (ref 0–40)

## 2015-09-06 LAB — CBC
HEMATOCRIT: 26.7 % — AB (ref 35.0–47.0)
HEMOGLOBIN: 8.7 g/dL — AB (ref 12.0–16.0)
MCH: 28.4 pg (ref 26.0–34.0)
MCHC: 32.4 g/dL (ref 32.0–36.0)
MCV: 87.6 fL (ref 80.0–100.0)
Platelets: 184 10*3/uL (ref 150–440)
RBC: 3.05 MIL/uL — ABNORMAL LOW (ref 3.80–5.20)
RDW: 14.1 % (ref 11.5–14.5)
WBC: 7.1 10*3/uL (ref 3.6–11.0)

## 2015-09-06 LAB — TROPONIN I: TROPONIN I: 0.15 ng/mL — AB (ref <0.031)

## 2015-09-06 MED ORDER — ENOXAPARIN SODIUM 40 MG/0.4ML ~~LOC~~ SOLN
40.0000 mg | Freq: Two times a day (BID) | SUBCUTANEOUS | Status: DC
  Administered 2015-09-06 – 2015-09-09 (×6): 40 mg via SUBCUTANEOUS
  Filled 2015-09-06 (×6): qty 0.4

## 2015-09-06 NOTE — Progress Notes (Signed)
Anticoagulation Monitoring  74 yo female ordered enoxaparin 40 mg SQ q24h for DVT ppx  Scr 1.39, CrCl 54.3 ml/min, BMI 49.4 Plt 184  Based on BMI >40, will adjust to enoxaparin 40 mg SQ 0000000 per hospital policy.   Rayna Sexton, PharmD, BCPS Clinical Pharmacist 09/06/2015 4:39 PM

## 2015-09-06 NOTE — Progress Notes (Signed)
Patient has been in bed resting. Patient stated she did not sleep well last night due to the mattress being uncomfortable. Patient did work with PT today. Patient back in bed. No complaints of pain.

## 2015-09-06 NOTE — Evaluation (Addendum)
Physical Therapy Evaluation Patient Details Name: Carolyn Valdez MRN: [id number] DOB: 1941/04/21 Today's Date: 09/06/2015   History of Present Illness  Carolyn Valdez is a 74 y.o. female with a known history of morbid obesity, diabetes, heart failure. She presents with weakness and not feeling well. At admission she reported sleeping for a few days at a time. She presented to the ER with sweating/fever. She recently had a urinary tract infection. Carolyn Valdez reports she feels weak and very tired.   Clinical Impression  Carolyn Valdez presents as a pleasant, but lethargic 74 y/o female demonstrating general weakness, deconditioning, impaired pulmonary status, impaired gait and balance. Carolyn Valdez reqired min A for bed mobility today and was able to ambulate with CGA and BRW x 48ft. She did continue to need 2L O2 to keep O2 saturation >90%. Carolyn Valdez would benefit from continued skilled Carolyn Valdez services to improve the above stated impairments to maximize mobility, independence and return Carolyn Valdez to PLOF.    Follow Up Recommendations SNF    Equipment Recommendations       Recommendations for Other Services       Precautions / Restrictions Precautions Precautions: Fall Restrictions Weight Bearing Restrictions: No      Mobility  Bed Mobility Overal bed mobility: Needs Assistance Bed Mobility: Supine to Sit;Sit to Supine     Supine to sit: Min assist Sit to supine: Min assist   General bed mobility comments: Carolyn Valdez required +1 min A for bed mobility particularily scooting in bed and help lifting her lower extremities into the bed. Carolyn Valdez used the bed rails.   Transfers Overall transfer level: Modified independent Equipment used: Rolling walker (2 wheeled) (bariatric)             General transfer comment: Carolyn Valdez performed sit to stand and stand to sit with CGA using the BRW. min cues for hand placement. Carolyn Valdez moves at a slow pace and needs cues to stay on task. O2 removed in sitting Carolyn Valdez desaturated to 89%.   Ambulation/Gait Ambulation/Gait  assistance: Modified independent (Device/Increase time);Min guard Ambulation Distance (Feet): 40 Feet Assistive device:  (bariatric RW) Gait Pattern/deviations: Wide base of support;Step-through pattern   Gait velocity interpretation: <1.8 ft/sec, indicative of risk for recurrent falls General Gait Details: Carolyn Valdez ambualates with forward posture, slow cadence, but good balance using the RW. O2 saturation remained stable at 90-94% on 2L O2. HR increased slightly to 74BPM.  Stairs            Wheelchair Mobility    Modified Rankin (Stroke Patients Only)       Balance Overall balance assessment: Needs assistance;History of Falls Sitting-balance support: No upper extremity supported Sitting balance-Leahy Scale: Good     Standing balance support: No upper extremity supported Standing balance-Leahy Scale: Fair Standing balance comment: + [place]. good balance when holding onto RW                             Pertinent Vitals/Pain Pain Assessment: 0-10 Pain Score: 3  Pain Location: lower back Pain Descriptors / Indicators: Aching Pain Intervention(s): Monitored during session    Home Living Family/patient expects to be discharged to:: Private residence Living Arrangements: Alone Available Help at Discharge:  (reports none.) Type of Home:  (condo) Home Access: Level entry     Home Layout: One level Home Equipment: Walker - 2 wheels;Cane - single point Additional Comments: reports she usually uses a walker    Prior Function Level of Independence: Independent with  assistive device(s)         Comments: reports community ambulator with device     Hand Dominance        Extremity/Trunk Assessment   Upper Extremity Assessment: Generalized weakness           Lower Extremity Assessment: Generalized weakness      Cervical / Trunk Assessment: Kyphotic  Communication   Communication: No difficulties (lethargic)  Cognition Arousal/Alertness:  Lethargic Behavior During Therapy: WFL for tasks assessed/performed Overall Cognitive Status: Within Functional Limits for tasks assessed                      General Comments  Carolyn Valdez applied lotion per patient request to the lower legs.     Exercises        Assessment/Plan    Carolyn Valdez Assessment Patient needs continued Carolyn Valdez services  Carolyn Valdez Diagnosis Difficulty walking;Generalized weakness   Carolyn Valdez Problem List Decreased strength;Decreased activity tolerance;Decreased balance;Decreased mobility;Obesity  Carolyn Valdez Treatment Interventions Gait training;Therapeutic activities;Therapeutic exercise;Patient/family education;Neuromuscular re-education;Balance training   Carolyn Valdez Goals (Current goals can be found in the Care Plan section) Acute Rehab Carolyn Valdez Goals Patient Stated Goal: improve endurance  Carolyn Valdez Goal Formulation: With patient Time For Goal Achievement: 09/20/15 Potential to Achieve Goals: Good    Frequency Min 2X/week   Barriers to discharge Decreased caregiver support lives alone. reports no help available    Co-evaluation               End of Session Equipment Utilized During Treatment: Gait belt;Oxygen Activity Tolerance: Patient limited by lethargy Patient left: in bed;with call bell/phone within reach;with bed alarm set           Time: 1310-1343 Carolyn Valdez Time Calculation (min) (ACUTE ONLY): 33 min   Charges:   Carolyn Valdez Evaluation $Initial Carolyn Valdez Evaluation Tier I: 1 Procedure     Carolyn Valdez G Codes:       Ithan Touhey C. Antoinette Borgwardt, Carolyn Valdez, DPT [phone]  Isacc Turney 09/06/2015, 1:59 PM

## 2015-09-06 NOTE — Progress Notes (Signed)
Carolyn Valdez at [place] NAME: Carolyn Valdez    MR#:  [id number]  DATE OF BIRTH:  04-23-1941  SUBJECTIVE:  CHIEF COMPLAINT:   Chief Complaint  Patient presents with  . Fatigue  . Shortness of Breath   Patient here due to shortness of breath and weakness and noted to be in congestive heart failure. Still feels a bit short of breath but improved since yesterday.  REVIEW OF SYSTEMS:    Review of Systems  Constitutional: Negative for fever and chills.  HENT: Negative for congestion and tinnitus.   Eyes: Negative for blurred vision and double vision.  Respiratory: Positive for shortness of breath. Negative for cough and wheezing.   Cardiovascular: Positive for leg swelling. Negative for chest pain, orthopnea and PND.  Gastrointestinal: Negative for nausea, vomiting, abdominal pain and diarrhea.  Genitourinary: Negative for dysuria and hematuria.  Neurological: Positive for weakness (generalized). Negative for dizziness, sensory change and focal weakness.  All other systems reviewed and are negative.   Nutrition: Heart healthy Tolerating Diet: Yes Tolerating PT: Await evaluation   DRUG ALLERGIES:   Allergies  Allergen Reactions  . Sulfa Antibiotics Hives  . Biaxin [Clarithromycin] Hives  . Eggs Or Egg-Derived Products Other (See Comments)    Pt states that her allergy test said she was allergic to eggs.    . Influenza A (H1n1) Monoval Vac Other (See Comments)    Pt states that she was told by her MD not to get the influenza vaccine.    . Morphine Other (See Comments)    Reaction:  Dizziness and confusion   . Pyridium [Phenazopyridine Hcl] Other (See Comments)    Reaction:  Unknown   . Ceftriaxone Anxiety  . Latex Rash  . Prednisone Rash  . Tape Rash    VITALS:  Blood pressure 131/43, pulse 54, temperature 98.4 F (36.9 C), temperature source Oral, resp. rate 20, height 5\' 7"  (1.702 m), weight 142.883 kg (315 lb), SpO2 97  %.  PHYSICAL EXAMINATION:   Physical Exam  GENERAL:  74 y.o.-year-old obese patient lying in the bed with no acute distress.  EYES: Pupils equal, round, reactive to light and accommodation. No scleral icterus. Extraocular muscles intact.  HEENT: Head atraumatic, normocephalic. Oropharynx and nasopharynx clear.  NECK:  Supple, no jugular venous distention. No thyroid enlargement, no tenderness.  LUNGS: Bibasilar Rales, no rhonchi, wheezing. No use of accessory muscles of respiration.  CARDIOVASCULAR: S1, S2 normal. No murmurs, rubs, or gallops.  ABDOMEN: Soft, nontender, nondistended. Bowel sounds present. No organomegaly or mass.  EXTREMITIES: No cyanosis, clubbing, +1-2 pitting edema bilaterally.  NEUROLOGIC: Cranial nerves II through XII are intact. No focal Motor or sensory deficits b/l.  Globally weak PSYCHIATRIC: The patient is alert and oriented x 3. Good affect SKIN: No obvious rash, lesion, or ulcer.    LABORATORY PANEL:   CBC  Recent Labs Lab 09/06/15 0518  WBC 7.1  HGB 8.7*  HCT 26.7*  PLT 184   ------------------------------------------------------------------------------------------------------------------  Chemistries   Recent Labs Lab 09/06/15 0126  NA 137  K 3.8  CL 102  CO2 27  GLUCOSE 219*  BUN 32*  CREATININE 1.39*  CALCIUM 8.9   ------------------------------------------------------------------------------------------------------------------  Cardiac Enzymes  Recent Labs Lab 09/06/15 0126  TROPONINI 0.15*   ------------------------------------------------------------------------------------------------------------------  RADIOLOGY:  Dg Chest Port 1 View  09/05/2015  CLINICAL DATA:  Dyspnea, weakness for 1 week, previous smoker history. History of right mastectomy. EXAM: PORTABLE CHEST 1 VIEW COMPARISON:  Chest x-ray dated 07/09/2014. FINDINGS: Cardiomegaly, moderate to severe in degree, is stable. There is at least mild central pulmonary  vascular congestion and bilateral interstitial edema suggesting volume overload/CHF, likely on a background of chronic pulmonary artery hypertension and fibrosis. No confluent opacity to suggest a developing pneumonia. No large pleural effusion. No pneumothorax. IMPRESSION: Findings suggest mild volume overload/CHF, likely on a background of chronic fibrosis and pulmonary artery hypertension. Cardiomegaly, moderate to severe in degree, is stable. Electronically Signed   By: Franki Cabot M.D.   On: 09/05/2015 17:06     ASSESSMENT AND PLAN:   74 year old female with past medical history of CHF, hypertension, morbid obesity, history of breast cancer, type 2 diabetes without dictation, DJD, recurrent urinary tract infections, osteoarthritis, history of SVT, urinary incontinence who presented to the hospital due to weakness and shortness of breath and noted to be in congestive heart failure.  #1 acute CHF-this is acute on chronic diastolic dysfunction. I suspect she probably has undiagnosed sleep apnea. -Continue diuresis with IV Lasix, follow I's and O's and daily weights. Clinically improved since yesterday. -Continue metoprolol, losartan.  #2 generalized weakness-this is likely multifactorial and probably related to underlying CHF combined with morbid obesity and deconditioning. -Continue treatment of CHF as mentioned above. We'll get a physical therapy evaluation to assess mobility.  #3 type 2 diabetes without complication-continue Lantus, sliding scale insulin and follow blood sugars.  #4 diabetic neuropathy-continue Neurontin.  #5 hypertension-continue losartan, metoprolol, Norvasc.  #6 GERD-continue Protonix.  #7 history of breast cancer-continue Evista.  #8 elevated troponin-likely in setting of demand ischemia from CHF. No acute chest pain, troponins did not trend upwards. Echocardiogram showing normal ejection fraction with no wall motion abnormalities.    All the records are  reviewed and case discussed with Care Management/Social Workerr. Management plans discussed with the patient, family and they are in agreement.  CODE STATUS: Full Code  DVT Prophylaxis: Lovenox  TOTAL TIME TAKING CARE OF THIS PATIENT: 30 minutes.   POSSIBLE D/C IN 1-2 DAYS, DEPENDING ON CLINICAL CONDITION.   Henreitta Leber M.D on 09/06/2015 at 10:52 AM  Between 7am to 6pm - Pager - [phone]  After 6pm go to [email or web address] - password [email or web address] [facility]  [facility]  Office  [phone]  CC: Primary care physician; Adrian Prows, MD

## 2015-09-07 ENCOUNTER — Inpatient Hospital Stay: Payer: Medicare Other

## 2015-09-07 LAB — GLUCOSE, CAPILLARY
Glucose-Capillary: 150 mg/dL — ABNORMAL HIGH (ref 65–99)
Glucose-Capillary: 332 mg/dL — ABNORMAL HIGH (ref 65–99)
Glucose-Capillary: 187 mg/dL — ABNORMAL HIGH (ref 65–99)
Glucose-Capillary: 224 mg/dL — ABNORMAL HIGH (ref 65–99)

## 2015-09-07 LAB — BASIC METABOLIC PANEL
Anion gap: 7 (ref 5–15)
BUN: 25 mg/dL — ABNORMAL HIGH (ref 6–20)
CALCIUM: 9 mg/dL (ref 8.9–10.3)
CO2: 34 mmol/L — ABNORMAL HIGH (ref 22–32)
CREATININE: 1.14 mg/dL — AB (ref 0.44–1.00)
Chloride: 96 mmol/L — ABNORMAL LOW (ref 101–111)
GFR calc non Af Amer: 47 mL/min — ABNORMAL LOW (ref >60)
GFR, EST AFRICAN AMERICAN: 54 mL/min — AB (ref >60)
Glucose, Bld: 128 mg/dL — ABNORMAL HIGH (ref 65–99)
Potassium: 3.6 mmol/L (ref 3.5–5.1)
SODIUM: 137 mmol/L (ref 135–145)

## 2015-09-07 LAB — URINALYSIS COMPLETE WITH MICROSCOPIC (ARMC ONLY)
BILIRUBIN URINE: NEGATIVE
Bacteria, UA: NONE SEEN
GLUCOSE, UA: 50 mg/dL — AB
KETONES UR: NEGATIVE mg/dL
LEUKOCYTES UA: NEGATIVE
Nitrite: NEGATIVE
PH: 5 (ref 5.0–8.0)
Protein, ur: 30 mg/dL — AB
SPECIFIC GRAVITY, URINE: 1.006 (ref 1.005–1.030)

## 2015-09-07 LAB — URINE CULTURE
CULTURE: NO GROWTH
SPECIAL REQUESTS: NORMAL

## 2015-09-07 MED ORDER — FUROSEMIDE 10 MG/ML IJ SOLN
60.0000 mg | Freq: Every day | INTRAMUSCULAR | Status: DC
  Administered 2015-09-08: 60 mg via INTRAVENOUS
  Filled 2015-09-07: qty 6

## 2015-09-07 NOTE — Progress Notes (Signed)
[facility] [place] NAME: Carolyn Valdez    MR#:  [id number]  DATE OF BIRTH:  04-May-1941  SUBJECTIVE:   Patient here due to shortness of breath and weakness and noted to be in congestive heart failure. Still does not feel well.  Had a low grade fever of 100.9 this a.m. Responding well to diuresis.   REVIEW OF SYSTEMS:    Review of Systems  Constitutional: Positive for fever. Negative for chills.  HENT: Negative for congestion and tinnitus.   Eyes: Negative for blurred vision and double vision.  Respiratory: Positive for shortness of breath. Negative for cough and wheezing.   Cardiovascular: Positive for leg swelling. Negative for chest pain, orthopnea and PND.  Gastrointestinal: Negative for nausea, vomiting, abdominal pain and diarrhea.  Genitourinary: Negative for dysuria and hematuria.  Neurological: Positive for weakness (generalized). Negative for dizziness, sensory change and focal weakness.  All other systems reviewed and are negative.   Nutrition: Heart healthy Tolerating Diet: Yes Tolerating PT: Eval Noted.   DRUG ALLERGIES:   Allergies  Allergen Reactions  . Sulfa Antibiotics Hives  . Biaxin [Clarithromycin] Hives  . Eggs Or Egg-Derived Products Other (See Comments)    Pt states that her allergy test said she was allergic to eggs.    . Influenza A (H1n1) Monoval Vac Other (See Comments)    Pt states that she was told by her MD not to get the influenza vaccine.    . Morphine Other (See Comments)    Reaction:  Dizziness and confusion   . Pyridium [Phenazopyridine Hcl] Other (See Comments)    Reaction:  Unknown   . Ceftriaxone Anxiety  . Latex Rash  . Prednisone Rash  . Tape Rash    VITALS:  Blood pressure 134/45, pulse 54, temperature 98.5 F (36.9 C), temperature source Oral, resp. rate 18, height 5\' 7"  (1.702 m), weight 142.883 kg (315 lb), SpO2 92 %.  PHYSICAL EXAMINATION:   Physical Exam  GENERAL:  74  y.o.-year-old obese patient sitting up in chair in no acute distress.  EYES: Pupils equal, round, reactive to light and accommodation. No scleral icterus. Extraocular muscles intact.  HEENT: Head atraumatic, normocephalic. Oropharynx and nasopharynx clear.  NECK:  Supple, no jugular venous distention. No thyroid enlargement, no tenderness.  LUNGS: Bibasilar Rales, no rhonchi, wheezing. No use of accessory muscles of respiration.  CARDIOVASCULAR: S1, S2 normal. No murmurs, rubs, or gallops.  ABDOMEN: Soft, nontender, nondistended. Bowel sounds present. No organomegaly or mass.  EXTREMITIES: No cyanosis, clubbing, +1-2 pitting edema bilaterally.  NEUROLOGIC: Cranial nerves II through XII are intact. No focal Motor or sensory deficits b/l.  Globally weak PSYCHIATRIC: The patient is alert and oriented x 3. Good affect SKIN: No obvious rash, lesion, or ulcer.    LABORATORY PANEL:   CBC  Recent Labs Lab 09/06/15 0518  WBC 7.1  HGB 8.7*  HCT 26.7*  PLT 184   ------------------------------------------------------------------------------------------------------------------  Chemistries   Recent Labs Lab 09/07/15 0518  NA 137  K 3.6  CL 96*  CO2 34*  GLUCOSE 128*  BUN 25*  CREATININE 1.14*  CALCIUM 9.0   ------------------------------------------------------------------------------------------------------------------  Cardiac Enzymes  Recent Labs Lab 09/06/15 0126  TROPONINI 0.15*   ------------------------------------------------------------------------------------------------------------------  RADIOLOGY:  Dg Chest Port 1 View  09/05/2015  CLINICAL DATA:  Dyspnea, weakness for 1 week, previous smoker history. History of right mastectomy. EXAM: PORTABLE CHEST 1 VIEW COMPARISON:  Chest x-ray dated 07/09/2014. FINDINGS: Cardiomegaly, moderate to  severe in degree, is stable. There is at least mild central pulmonary vascular congestion and bilateral interstitial edema  suggesting volume overload/CHF, likely on a background of chronic pulmonary artery hypertension and fibrosis. No confluent opacity to suggest a developing pneumonia. No large pleural effusion. No pneumothorax. IMPRESSION: Findings suggest mild volume overload/CHF, likely on a background of chronic fibrosis and pulmonary artery hypertension. Cardiomegaly, moderate to severe in degree, is stable. Electronically Signed   By: Franki Cabot M.D.   On: 09/05/2015 17:06     ASSESSMENT AND PLAN:   74 year old female with past medical history of CHF, hypertension, morbid obesity, history of breast cancer, type 2 diabetes without dictation, DJD, recurrent urinary tract infections, osteoarthritis, history of SVT, urinary incontinence who presented to the hospital due to weakness and shortness of breath and noted to be in congestive heart failure.  #1 acute CHF-this is acute on chronic diastolic dysfunction. I suspect she probably has undiagnosed sleep apnea. -Continue diuresis with IV Lasix and responding well to it.  About 3 L (-) since admission. Clinically improved since yesterday. -Continue metoprolol, losartan.  #2 generalized weakness-this is likely multifactorial and probably related to underlying CHF combined with morbid obesity and deconditioning. -Continue treatment of CHF as mentioned above.  - PT eval noted and pt. Would benefit from SNF but she refuses at this time.   #3 Fever of unknown origin - pt. Had low grade fever of 100.9 this a.m. Source unclear.  - UA on admission was (-).  Will repeat today. Urine culture (-) so far.  - will get CXR and also doppler of Lower ext to r/o DVT and follow fever curve.  - hold off on abx at this time.   #4 type 2 diabetes without complication-continue Lantus, sliding scale insulin and follow blood sugars.  #5 diabetic neuropathy-continue Neurontin.  #6 hypertension-continue losartan, metoprolol, Norvasc.  #7 GERD-continue Protonix.  #8 history of  breast cancer-continue Evista.  #9 elevated troponin-likely in setting of demand ischemia from CHF. No acute chest pain, troponins did not trend upwards. Echocardiogram showing normal ejection fraction with no wall motion abnormalities.   PT eval noted and pt. Does NOT want to go to a SNF.    All the records are reviewed and case discussed with Care Management/Social Workerr. Management plans discussed with the patient, family and they are in agreement.  CODE STATUS: Full Code  DVT Prophylaxis: Lovenox  TOTAL TIME TAKING CARE OF THIS PATIENT: 30 minutes.   POSSIBLE D/C IN 1-2 DAYS, DEPENDING ON CLINICAL CONDITION.   Henreitta Leber M.D on 09/07/2015 at 12:33 PM  Between 7am to 6pm - Pager - [phone]  After 6pm go to [email or web address] - password [email or web address] [facility]  [place] [facility]  Office  [phone]  CC: Primary care physician; Adrian Prows, MD

## 2015-09-08 ENCOUNTER — Inpatient Hospital Stay: Payer: Medicare Other

## 2015-09-08 LAB — BASIC METABOLIC PANEL
Anion gap: 7 (ref 5–15)
BUN: 23 mg/dL — ABNORMAL HIGH (ref 6–20)
CHLORIDE: 94 mmol/L — AB (ref 101–111)
CO2: 36 mmol/L — AB (ref 22–32)
Calcium: 9 mg/dL (ref 8.9–10.3)
Creatinine, Ser: 1.15 mg/dL — ABNORMAL HIGH (ref 0.44–1.00)
GFR calc Af Amer: 54 mL/min — ABNORMAL LOW (ref >60)
GFR, EST NON AFRICAN AMERICAN: 46 mL/min — AB (ref >60)
GLUCOSE: 169 mg/dL — AB (ref 65–99)
Potassium: 3.4 mmol/L — ABNORMAL LOW (ref 3.5–5.1)
SODIUM: 137 mmol/L (ref 135–145)

## 2015-09-08 LAB — CBC
HCT: 28.2 % — ABNORMAL LOW (ref 35.0–47.0)
HEMOGLOBIN: 9.2 g/dL — AB (ref 12.0–16.0)
MCH: 28.3 pg (ref 26.0–34.0)
MCHC: 32.7 g/dL (ref 32.0–36.0)
MCV: 86.6 fL (ref 80.0–100.0)
Platelets: 238 10*3/uL (ref 150–440)
RBC: 3.26 MIL/uL — ABNORMAL LOW (ref 3.80–5.20)
RDW: 14.2 % (ref 11.5–14.5)
WBC: 5.8 10*3/uL (ref 3.6–11.0)

## 2015-09-08 LAB — GLUCOSE, CAPILLARY
GLUCOSE-CAPILLARY: 163 mg/dL — AB (ref 65–99)
Glucose-Capillary: 117 mg/dL — ABNORMAL HIGH (ref 65–99)
Glucose-Capillary: 162 mg/dL — ABNORMAL HIGH (ref 65–99)

## 2015-09-08 MED ORDER — FUROSEMIDE 40 MG PO TABS
40.0000 mg | ORAL_TABLET | Freq: Every day | ORAL | Status: DC
  Administered 2015-09-09: 40 mg via ORAL
  Filled 2015-09-08: qty 1

## 2015-09-08 MED ORDER — INSULIN GLARGINE 100 UNIT/ML ~~LOC~~ SOLN
25.0000 [IU] | Freq: Once | SUBCUTANEOUS | Status: AC
  Administered 2015-09-09: 25 [IU] via SUBCUTANEOUS
  Filled 2015-09-08: qty 0.25

## 2015-09-08 NOTE — Clinical Social Work Placement (Signed)
   CLINICAL SOCIAL WORK PLACEMENT  NOTE  Date:  09/08/2015  Patient Details  Name: KELAHNI VATTER MRN: [id number] Date of Birth: [birth year]  Clinical Social Work is seeking post-discharge placement for this patient at the [place] level of care (*CSW will initial, date and re-position this form in  chart as items are completed):      Patient/family provided with [facility]'s list of facilities offering this level of care within the geographic area requested by the patient (or if unable, by the patient's family).      Patient/family informed of their freedom to choose among providers that offer the needed level of care, that participate in Medicare, Medicaid or managed care program needed by the patient, have an available bed and are willing to accept the patient.      Patient/family informed of [facility]'s ownership interest in [facility] and [facility], as well as of the fact that they are under no obligation to receive care at these facilities.  PASRR submitted to EDS on       PASRR number received on       Existing PASRR number confirmed on 09/08/15     FL2 transmitted to all facilities in geographic area requested by pt/family on 09/08/15     FL2 transmitted to all facilities within larger geographic area on       Patient informed that his/her managed care company has contracts with or will negotiate with certain facilities, including the following:            Patient/family informed of bed offers received.  Patient chooses bed at       Physician recommends and patient chooses bed at      Patient to be transferred to   on  .  Patient to be transferred to facility by       Patient family notified on   of transfer.  Name of family member notified:        PHYSICIAN Please sign FL2, Please sign DNR, Please prepare priority discharge summary, including medications     Additional Comment:     _______________________________________________ Alonna Buckler, LCSW 09/08/2015, 9:42 PM

## 2015-09-08 NOTE — Progress Notes (Addendum)
Patient has successfully completed post-foley removal void well before 1400 deadline. Unable to measure, however, as urine could not be contained in bedpan.

## 2015-09-08 NOTE — Care Management Important Message (Signed)
Important Message  Patient Details  Name: Carolyn Valdez MRN: [id number] Date of Birth: 11-Aug-1941   Medicare Important Message Given:  Yes    [name] A Donjuan Robison 09/08/2015, 2:39 PM

## 2015-09-08 NOTE — Care Management (Signed)
Patient admitted for congestive heart failure.  She says that she had that "a long time ago."  She has not required home 02.  She lives alone and has a dog as a Loss adjuster, chartered.    She does have a walker and says prior to this admission, she was driving.   As of today- 11/21- patient has qualified for home 02.  If patient discharges home, says "I think my sister could drive me but she can  not stand up."  Discussed the recommendation of snf by physical therapy and rationales.  Patient becomes tearful and says "I want to go home."  Discussed her weakness and skilled need of not only functional improvement but  also for her medical improvement as well.  Discussed that ultimate goal is to return home- that skilled nursing is anticipated as a short term stay.   She acknowledges her profound weakness.  There are no friends or family that can provide support at home.  She is now agreeable to have a bed search initiated and also to initiate a referral to [facility] for home health and 02 if needed.   Updated CSW.

## 2015-09-08 NOTE — Progress Notes (Signed)
[facility] [place] NAME: Carolyn Valdez    MR#:  [id number]  DATE OF BIRTH:  01-14-1941  SUBJECTIVE:   Patient here due to shortness of breath and weakness and noted to be in congestive heart failure. Still does not feel well.  Had a low grade fever of 100.9 this a.m. Responding well to diuresis.   REVIEW OF SYSTEMS:    Review of Systems  Constitutional: Negative for fever and chills.  HENT: Negative for congestion and tinnitus.   Eyes: Negative for blurred vision and double vision.  Respiratory: Positive for shortness of breath. Negative for cough and wheezing.   Cardiovascular: Positive for leg swelling. Negative for chest pain, orthopnea and PND.  Gastrointestinal: Negative for nausea, vomiting, abdominal pain and diarrhea.  Genitourinary: Negative for dysuria and hematuria.  Neurological: Positive for weakness (generalized). Negative for dizziness, sensory change and focal weakness.  All other systems reviewed and are negative.   Nutrition: Heart healthy Tolerating Diet: Yes Tolerating PT: Eval Noted.   DRUG ALLERGIES:   Allergies  Allergen Reactions  . Sulfa Antibiotics Hives  . Biaxin [Clarithromycin] Hives  . Eggs Or Egg-Derived Products Other (See Comments)    Pt states that her allergy test said she was allergic to eggs.    . Influenza A (H1n1) Monoval Vac Other (See Comments)    Pt states that she was told by her MD not to get the influenza vaccine.    . Morphine Other (See Comments)    Reaction:  Dizziness and confusion   . Pyridium [Phenazopyridine Hcl] Other (See Comments)    Reaction:  Unknown   . Ceftriaxone Anxiety  . Latex Rash  . Prednisone Rash  . Tape Rash    VITALS:  Blood pressure 156/43, pulse 75, temperature 99.1 F (37.3 C), temperature source Oral, resp. rate 18, height 5\' 7"  (1.702 m), weight 142.883 kg (315 lb), SpO2 91 %.  PHYSICAL EXAMINATION:   Physical Exam  GENERAL:  74  y.o.-year-old obese patient sitting up in chair in no acute distress.  EYES: Pupils equal, round, reactive to light and accommodation. No scleral icterus. Extraocular muscles intact.  HEENT: Head atraumatic, normocephalic. Oropharynx and nasopharynx clear.  NECK:  Supple, no jugular venous distention. No thyroid enlargement, no tenderness.  LUNGS: Bibasilar Rales improving, no rhonchi, wheezing. No use of accessory muscles of respiration.  CARDIOVASCULAR: S1, S2 normal. No murmurs, rubs, or gallops.  ABDOMEN: Soft, nontender, nondistended. Bowel sounds present. No organomegaly or mass.  EXTREMITIES: No cyanosis, clubbing, +1-2 pitting edema bilaterally.  NEUROLOGIC: Cranial nerves II through XII are intact. No focal Motor or sensory deficits b/l.  Globally weak PSYCHIATRIC: The patient is alert and oriented x 3. Good affect SKIN: No obvious rash, lesion, or ulcer.    LABORATORY PANEL:   CBC  Recent Labs Lab 09/08/15 0431  WBC 5.8  HGB 9.2*  HCT 28.2*  PLT 238   ------------------------------------------------------------------------------------------------------------------  Chemistries   Recent Labs Lab 09/08/15 0431  NA 137  K 3.4*  CL 94*  CO2 36*  GLUCOSE 169*  BUN 23*  CREATININE 1.15*  CALCIUM 9.0   ------------------------------------------------------------------------------------------------------------------  Cardiac Enzymes  Recent Labs Lab 09/06/15 0126  TROPONINI 0.15*   ------------------------------------------------------------------------------------------------------------------  RADIOLOGY:  Dg Chest 1 View  09/08/2015  CLINICAL DATA:  Fevers of unknown origin EXAM: CHEST  1 VIEW COMPARISON:  09/05/2015 FINDINGS: Cardiac shadow remains mildly enlarged. Aortic calcifications are again seen. The lungs are well aerated  bilaterally. Mild increased interstitial changes are seen stable from the previous exam. No focal confluent infiltrate is noted.  No bony abnormality is seen. IMPRESSION: Stable appearance of the chest from the previous exam. No new focal infiltrate is seen. Electronically Signed   By: Inez Catalina M.D.   On: 09/08/2015 07:20   US Venous Img Lower Bilateral  09/07/2015  CLINICAL DATA:  Pain and swelling of the lower extremities. History of breast cancer. EXAM: BILATERAL LOWER EXTREMITY VENOUS DOPPLER ULTRASOUND TECHNIQUE: Gray-scale sonography with graded compression, as well as color Doppler and duplex ultrasound were performed to evaluate the lower extremity deep venous systems from the level of the common femoral vein and including the common femoral, femoral, profunda femoral, popliteal and calf veins including the posterior tibial, peroneal and gastrocnemius veins when visible. The superficial great saphenous vein was also interrogated. Spectral Doppler was utilized to evaluate flow at rest and with distal augmentation maneuvers in the common femoral, femoral and popliteal veins. COMPARISON:  05/23/2006 FINDINGS: RIGHT LOWER EXTREMITY Common Femoral Vein: No evidence of thrombus. Normal compressibility, respiratory phasicity and response to augmentation. Saphenofemoral Junction: No evidence of thrombus. Normal compressibility and flow on color Doppler imaging. Profunda Femoral Vein: No evidence of thrombus. Normal compressibility and flow on color Doppler imaging. Femoral Vein: No evidence of thrombus. Normal compressibility, respiratory phasicity and response to augmentation. Popliteal Vein: No evidence of thrombus. Normal compressibility, respiratory phasicity and response to augmentation. Calf Veins: Not visualized due to patient's body habitus. Superficial Great Saphenous Vein: No evidence of thrombus. Normal compressibility and flow on color Doppler imaging. Venous Reflux:  None. Other Findings:  None. LEFT LOWER EXTREMITY Common Femoral Vein: No evidence of thrombus. Normal compressibility, respiratory phasicity and response to  augmentation. Saphenofemoral Junction: No evidence of thrombus. Normal compressibility and flow on color Doppler imaging. Profunda Femoral Vein: No evidence of thrombus. Normal compressibility and flow on color Doppler imaging. Femoral Vein: No evidence of thrombus. Normal compressibility, respiratory phasicity and response to augmentation. Popliteal Vein: No evidence of thrombus. Normal compressibility, respiratory phasicity and response to augmentation. Calf Veins: Not visualized due to patient's body habitus. Superficial Great Saphenous Vein: No evidence of thrombus. Normal compressibility and flow on color Doppler imaging. Venous Reflux:  None. Other Findings:  None. IMPRESSION: No evidence of deep venous thrombosis bilaterally, accounting for non visualization of the calf veins. Electronically Signed   By: Fidela Salisbury M.D.   On: 09/07/2015 14:58     ASSESSMENT AND PLAN:   74 year old female with past medical history of CHF, hypertension, morbid obesity, history of breast cancer, type 2 diabetes without dictation, DJD, recurrent urinary tract infections, osteoarthritis, history of SVT, urinary incontinence who presented to the hospital due to weakness and shortness of breath and noted to be in congestive heart failure.  #1 acute CHF-this is acute on chronic diastolic dysfunction. I suspect she probably has undiagnosed sleep apnea. -responded well to IV diuresis and is about 6 L negative since admission.  Switch from IV Lasix to oral today. -Continue metoprolol, losartan.  #2 generalized weakness-this is likely multifactorial and probably related to underlying CHF combined with morbid obesity and deconditioning. -Continue treatment of CHF as mentioned above.  - PT eval noted and pt. Would benefit from SNF but she refuses at this time, will DC home with home health nursing and PT..   #3 Fever of unknown origin - pt. Had low grade fever of 100.9 yesterday and none overnigt. Source unclear.   - UA yesterday (-).  Urine culture (-) so far.   CXR (-) for pneumonia.  Doppler of LE (-) for DVT - hold off on abx at this time.  - follow fever curve.   #4 type 2 diabetes without complication-continue Lantus, sliding scale insulin and follow blood sugars.  #5 diabetic neuropathy-continue Neurontin.  #6 hypertension-continue losartan, metoprolol, Norvasc.  #7 GERD-continue Protonix.  #8 history of breast cancer-continue Evista.  #9 elevated troponin-likely in setting of demand ischemia from CHF. No acute chest pain, troponins did not trend upwards. Echocardiogram showing normal ejection fraction with no wall motion abnormalities.   PT eval noted and pt. Does NOT want to go to a SNF. Likely d/c home tomorrow w/ Home health PT, RN, Aid.    All the records are reviewed and case discussed with Care Management/Social Workerr. Management plans discussed with the patient, family and they are in agreement.  CODE STATUS: Full Code  DVT Prophylaxis: Lovenox  TOTAL TIME TAKING CARE OF THIS PATIENT: 30 minutes.   POSSIBLE D/C IN 1-2 DAYS, DEPENDING ON CLINICAL CONDITION.   Henreitta Leber M.D on 09/08/2015 at 2:09 PM  Between 7am to 6pm - Pager - [phone]  After 6pm go to [email or web address] - password [email or web address] [facility]  [place] [facility]  Office  [phone]  CC: Primary care physician; Adrian Prows, MD

## 2015-09-08 NOTE — Progress Notes (Addendum)
Pt up in chair with 2 assist with great encouragement. Sitting up in chair with alarm on, eating lunch at this time. Pt requested to sit on bedpan, "for a while because I pee a lot," while sitting in chair. Pt educated on risk of skin breakdown but still expresses desire to sit on bedpan. Pt educated on calling the RN or NT when she is ready to come off of the bedpan, pt reports understanding.

## 2015-09-08 NOTE — NC FL2 (Signed)
[place] LEVEL OF CARE SCREENING TOOL     IDENTIFICATION  Patient Name: Carolyn Valdez: 08/27/1941 Sex: female Admission Date (Current Location): 09/05/2015  [place] and [place] Number: [facility] and Address:  [facility], [address], [place], [place] [address]      Provider Number: [id number]  Attending Physician Name and Address:  Henreitta Leber, MD  Relative Name and Phone Number:       Current Level of Care: Hospital Recommended Level of Care: [place] Prior Approval Number:    Date Approved/Denied:   PASRR Number:    Discharge Plan: SNF    Current Diagnoses: Patient Active Problem List   Diagnosis Date Noted  . Acute respiratory failure with hypoxia ([place]) 09/05/2015  . 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 Incontinence 04/20/2015  . Diabetes mellitus, type 2 ([place]) 04/17/2015  . ESBL (extended spectrum beta-lactamase) producing bacteria infection 04/17/2015  . BP (high blood pressure) 04/17/2015  . Frequent UTI 04/17/2015  . Absence of bladder continence 04/17/2015  . Iron deficiency anemia 03/08/2015  . Absolute anemia 09/25/2014  . Abdominal pain, lower 09/25/2014  . Urge incontinence 02/19/2013  . FOM (frequency of micturition) 02/19/2013  . Bladder infection, chronic 08/11/2012  . Difficult or painful urination 07/26/2012  . Lower urinary tract infection 12/31/2011    Orientation ACTIVITIES/SOCIAL BLADDER RESPIRATION    Self, Time, Situation, Place  Family supportive Incontinent O2 (As needed)  BEHAVIORAL SYMPTOMS/MOOD NEUROLOGICAL BOWEL NUTRITION STATUS      Continent Diet (carb modified/heart healthy )  PHYSICIAN VISITS COMMUNICATION OF NEEDS Height & Weight Skin  30 days Verbally   315 lbs. Normal          AMBULATORY STATUS RESPIRATION    Assist extensive O2 (As needed)      Personal Care Assistance Level of Assistance  Bathing, Dressing Bathing Assistance:  Maximum assistance   Dressing Assistance: Maximum assistance      Functional Limitations Info                SPECIAL CARE FACTORS FREQUENCY  PT (By licensed PT), OT (By licensed OT)     PT Frequency: 5 x week OT Frequency: 5 x week           Additional Factors Info  Code Status, Allergies Code Status Info: DNR         Allergies  Allergen Reactions  . Sulfa Antibiotics Hives  . Biaxin [Clarithromycin] Hives  . Eggs Or Egg-Derived Products Other (See Comments)    Pt states that her allergy test said she was allergic to eggs.   . Influenza A (H1n1) Mon[place] Vac Other (See Comments)    Pt states that she was told by her MD not to get the influenza vaccine.   . Morphine Other (See Comments)    Reaction: Dizziness and confusion   . Pyridium [Phenazopyridine Hcl] Other (See Comments)    Reaction: Unknown   . Ceftriaxone Anxiety  . Latex Rash  . Prednisone Rash  . Tape Rash                  Current Medications (09/08/2015): Current Facility-Administered Medications  Medication Dose Route Frequency Provider Last Rate Last Dose  . acetaminophen (TYLENOL) tablet 650 mg  650 mg Oral Q6H PRN Loletha Grayer, MD   650 mg at 09/07/15 2205   Or  . acetaminophen (TYLENOL) suppository 650 mg  650 mg Rectal Q6H PRN Delfino Lovett  Wieting, MD      . amLODipine (NORVASC) tablet 5 mg  5 mg Oral Daily Loletha Grayer, MD   5 mg at 09/08/15 0909  . aspirin EC tablet 81 mg  81 mg Oral Daily Loletha Grayer, MD   81 mg at 09/08/15 0909  . atorvastatin (LIPITOR) tablet 20 mg  20 mg Oral QHS Loletha Grayer, MD   20 mg at 09/07/15 2111  . clonazePAM (KLONOPIN) tablet 1 mg  1 mg Oral TID Loletha Grayer, MD   1 mg at 09/08/15 1629  . conjugated estrogens (PREMARIN) vaginal cream 1 Applicatorful  1 Applicatorful Vaginal Once per day on Mon Wed Fri Loletha Grayer, MD   1 Applicatorful at [id number]  . desmopressin (DDAVP) tablet 0.2 mg   0.2 mg Oral QHS Loletha Grayer, MD   0.2 mg at 09/07/15 2111  . docusate sodium (COLACE) capsule 200 mg  200 mg Oral BID Loletha Grayer, MD   200 mg at 09/08/15 0909  . enoxaparin (LOVENOX) injection 40 mg  40 mg Subcutaneous Q12H Loletha Grayer, MD   40 mg at 09/08/15 [id number]  . furosemide (LASIX) tablet 40 mg  40 mg Oral Daily Henreitta Leber, MD   40 mg at 09/08/15 1000  . gabapentin (NEURONTIN) capsule 300 mg  300 mg Oral Q lunch Loletha Grayer, MD   300 mg at 09/08/15 1204  . gabapentin (NEURONTIN) capsule 600 mg  600 mg Oral BID Loletha Grayer, MD   600 mg at 09/08/15 0909  . hydrOXYzine (ATARAX/VISTARIL) tablet 25 mg  25 mg Oral QHS Loletha Grayer, MD   25 mg at 09/07/15 2111  . insulin aspart (novoLOG) injection 0-5 Units  0-5 Units Subcutaneous QHS Loletha Grayer, MD   2 Units at 09/07/15 2157  . insulin aspart (novoLOG) injection 0-9 Units  0-9 Units Subcutaneous TID WC Loletha Grayer, MD   2 Units at 09/08/15 1705  . insulin glargine (LANTUS) injection 50 Units  50 Units Subcutaneous QHS Loletha Grayer, MD   50 Units at 09/07/15 2110  . iron polysaccharides (NIFEREX) capsule 150 mg  150 mg Oral Daily Loletha Grayer, MD   150 mg at 09/08/15 0909  . loratadine (CLARITIN) tablet 10 mg  10 mg Oral Daily Loletha Grayer, MD   10 mg at 09/08/15 0909  . losartan (COZAAR) tablet 100 mg  100 mg Oral Daily Loletha Grayer, MD   100 mg at 09/08/15 0909  . metoprolol succinate (TOPROL-XL) 24 hr tablet 100 mg  100 mg Oral BID Loletha Grayer, MD   100 mg at 09/08/15 0909  . pantoprazole (PROTONIX) EC tablet 40 mg  40 mg Oral BID AC Lytle Butte, MD   40 mg at 09/08/15 1705  . raloxifene (EVISTA) tablet 60 mg  60 mg Oral QHS Loletha Grayer, MD   60 mg at 09/07/15 2110  . sodium chloride 0.9 % injection 3 mL  3 mL Intravenous Q12H Loletha Grayer, MD   3 mL at 09/08/15 0910  . vitamin E capsule 1,000 Units  1,000 Units Oral Daily Loletha Grayer, MD   1,000 Units at 09/08/15 0908   Do not  use this list as official medication orders. Please verify with discharge summary.  Discharge Medications:   Medication List    ASK your doctor about these medications        amLODipine 5 MG tablet  Commonly known as:  NORVASC  Take 5 mg by mouth daily.     amoxicillin-clavulanate 500-125  MG tablet  Commonly known as:  AUGMENTIN  Take 1 tablet (500 mg total) by mouth 3 (three) times daily.     atorvastatin 20 MG tablet  Commonly known as:  LIPITOR  Take 20 mg by mouth at bedtime.     clonazePAM 1 MG tablet  Commonly known as:  KLONOPIN  Take 1 mg by mouth 3 (three) times daily.     conjugated estrogens vaginal cream  Commonly known as:  PREMARIN  Place 1 Applicatorful vaginally 3 (three) times a week. Pt uses on Monday, Wednesday, and Friday.     desmopressin 0.2 MG tablet  Commonly known as:  DDAVP  Take 0.2 mg by mouth at bedtime.     docusate sodium 100 MG capsule  Commonly known as:  COLACE  Take 200 mg by mouth 2 (two) times daily.     fexofenadine 180 MG tablet  Commonly known as:  ALLEGRA  Take 180 mg by mouth daily.     furosemide 20 MG tablet  Commonly known as:  LASIX  Take 20 mg by mouth daily.     gabapentin 300 MG capsule  Commonly known as:  NEURONTIN  Take 300-600 mg by mouth 3 (three) times daily. Pt takes two capsules in the morning, one capsule at lunch, and two capsules in the evening.     hydrOXYzine 25 MG tablet  Commonly known as:  ATARAX/VISTARIL  Take 25 mg by mouth at bedtime.     insulin glargine 100 UNIT/ML injection  Commonly known as:  LANTUS  Inject 50 Units into the skin at bedtime.     iron polysaccharides 150 MG capsule  Commonly known as:  NIFEREX  Take 150 mg by mouth daily.     losartan 100 MG tablet  Commonly known as:  COZAAR  Take 100 mg by mouth daily.     metFORMIN 1000 MG tablet  Commonly known as:  GLUCOPHAGE  Take 1,000 mg by mouth 2 (two) times daily with a meal.     metoprolol succinate 100 MG 24 hr  tablet  Commonly known as:  TOPROL-XL  Take 100 mg by mouth 2 (two) times daily.     MYRBETRIQ 50 MG Tb24 tablet  Generic drug:  mirabegron ER  Take 50 mg by mouth every evening.     omeprazole 40 MG capsule  Commonly known as:  PRILOSEC  Take 40 mg by mouth 2 (two) times daily.     pioglitazone 30 MG tablet  Commonly known as:  ACTOS  Take 30 mg by mouth daily.     raloxifene 60 MG tablet  Commonly known as:  EVISTA  Take 60 mg by mouth at bedtime.     solifenacin 10 MG tablet  Commonly known as:  VESICARE  Take 10 mg by mouth at bedtime.     vitamin E 1000 UNIT capsule  Take 1,000 Units by mouth daily.        Relevant Imaging Results:  Relevant Lab Results:  Recent Labs    Additional [place], LCSW

## 2015-09-08 NOTE — Progress Notes (Signed)
SATURATION QUALIFICATIONS: (This note is used to comply with regulatory documentation for home oxygen)  Patient Saturations on Room Air at Rest = 85%  Patient Saturations on Room Air while Ambulating = unsafe to ambulate patient with resting RA sats at 85%  Patient Saturations on N/A Liters of oxygen while Ambulating =  Please briefly explain why patient needs home oxygen:

## 2015-09-08 NOTE — Progress Notes (Signed)
Physical Therapy Treatment Patient Details Name: Carolyn Valdez MRN: [id number] DOB: June 14, 1941 Today's Date: 09/08/2015    History of Present Illness Carolyn Valdez is a 74 y.o. female with a known history of morbid obesity, diabetes, heart failure. She presents with weakness and not feeling well. At admission she reported sleeping for a few days at a time. She presented to the ER with sweating/fever. She recently had a urinary tract infection. pt reports she feels weak and very tired.     PT Comments    Pt noted to have low O2 saturation (86%) upon start of session. Difficulty to assess pt's baseline function/cognition, but pt demonstrating slow to process information and difficulty finishing complete thoughts. Pt complains of general malaise and gives no specifics despite questions. Denies pain, shortness of breath, confusion, visual disturbances, unilateral weakness and does not slur speech. Pt participates very little with exercises/movement and complains when attempting to give assist with bilateral lower extremities; yet continues to deny pain. Pt demonstrates very limited motion with exercises and limited strength with isometric strengthening. Pt educated several times on pursed lip breathing with no consistent improvement in O2 saturation, which remains 86-88%. Attempted call nurse with inability to get through. Attempted to sit pt at edge of bed, but pt with difficulty performing/following instruction to move legs to edge of bed despite assist. Requested nursing assistant perform vitals. BP 156/43, heart rate within normal range, temp 99.1 degrees, and O2 saturation continues mid to high 80s. Initial plan was to try and get pt up to chair, but deferred due to pt's BP and poor ability to follow instruction/move lower extremities. Continued to attempt to call nurse and additional 3 times with inability to get through. Relayed all information to nursing assistant who notes he will share with pt's nurse.  Pt returned to bed. Continue PT as appropriate for progression of strength and active range of motion to allow for improved functional mobility.    Follow Up Recommendations  SNF     Equipment Recommendations       Recommendations for Other Services       Precautions / Restrictions Precautions Precautions: Fall Restrictions Weight Bearing Restrictions: No    Mobility  Bed Mobility Overal bed mobility: Needs Assistance             General bed mobility comments: Unable to get pt to move legs toward edge of bed with or without help.   Transfers                 General transfer comment: Deferred due to low O2 saturation and BP of 156/43  Ambulation/Gait                 Stairs            Wheelchair Mobility    Modified Rankin (Stroke Patients Only)       Balance                                    Cognition Arousal/Alertness: Awake/alert Behavior During Therapy: Flat affect (slow response, does not finish thoughts with conversation) Overall Cognitive Status: Difficult to assess (Pt slow to process, lack participation/follow commands)                      Exercises General Exercises - Lower Extremity Ankle Circles/Pumps: AROM;Both;15 reps;Supine Quad Sets: Strengthening;Both;10 reps;Supine Gluteal Sets: Strengthening;Both;10 reps;Supine Heel Slides:  Both;10 reps;AAROM;Supine (very limited range of motion) Hip ABduction/ADduction: AAROM;Both;10 reps;Supine (very limited range of motion) Other Exercises Other Exercises: education/demonstration of pursed lip breathing.     General Comments        Pertinent Vitals/Pain Pain Assessment: No/denies pain    Home Living                      Prior Function            PT Goals (current goals can now be found in the care plan section) Progress towards PT goals: Not progressing toward goals - comment    Frequency  Min 2X/week    PT Plan Current plan  remains appropriate    Co-evaluation             End of Session Equipment Utilized During Treatment: Oxygen Activity Tolerance: Other (comment) (Pt either self limiting, or other complication limiting) Patient left: in bed;with call bell/phone within reach;with bed alarm set     Time: 1125-1150 PT Time Calculation (min) (ACUTE ONLY): 25 min  Charges:  $Therapeutic Exercise: 23-37 mins                    G Codes:      Charlaine Dalton 09/08/2015, 12:37 PM

## 2015-09-08 NOTE — Progress Notes (Signed)
Pt complaining of pain in bilat. lower extremities, pain full to touch and pressure. MD made aware; [place] BLE negative for DVTs yesterday. Pt declines offer of pain medication at this time. No further orders, will continue to monitor. Pt declines offer of pain medication at this time.

## 2015-09-09 LAB — BASIC METABOLIC PANEL
Anion gap: 10 (ref 5–15)
BUN: 26 mg/dL — AB (ref 6–20)
CHLORIDE: 92 mmol/L — AB (ref 101–111)
CO2: 36 mmol/L — AB (ref 22–32)
CREATININE: 1.35 mg/dL — AB (ref 0.44–1.00)
Calcium: 9 mg/dL (ref 8.9–10.3)
GFR calc Af Amer: 44 mL/min — ABNORMAL LOW (ref >60)
GFR calc non Af Amer: 38 mL/min — ABNORMAL LOW (ref >60)
GLUCOSE: 224 mg/dL — AB (ref 65–99)
Potassium: 3.3 mmol/L — ABNORMAL LOW (ref 3.5–5.1)
SODIUM: 138 mmol/L (ref 135–145)

## 2015-09-09 LAB — GLUCOSE, CAPILLARY
GLUCOSE-CAPILLARY: 233 mg/dL — AB (ref 65–99)
Glucose-Capillary: 215 mg/dL — ABNORMAL HIGH (ref 65–99)

## 2015-09-09 MED ORDER — CLONAZEPAM 1 MG PO TABS
1.0000 mg | ORAL_TABLET | Freq: Three times a day (TID) | ORAL | Status: DC
Start: 2015-09-09 — End: 2015-09-30

## 2015-09-09 MED ORDER — FUROSEMIDE 40 MG PO TABS: 40.0000 mg | ORAL_TABLET | Freq: Every day | ORAL | Status: DC

## 2015-09-09 NOTE — Clinical Social Work Note (Signed)
Clinical Social Work Assessment  Patient Details  Name: Carolyn Valdez MRN: [id number] Date of Birth: 09/22/1941  Date of referral:  09/08/15               Reason for consult:  Facility Placement                Permission sought to share information with:  Facility Sport and exercise psychologist, Tourist information centre manager, PCP, Family Supports Permission granted to share information::  Yes, Verbal Permission Granted  Name::     [name], sister  Agency::  [facility]NF[facility]  Relationship::     Contact Information:     Housing/Transportation Living arrangements for the past 2 months:  Single Family Home Source of Information:  Patient, Medical Team Patient Interpreter Needed:  None Criminal Activity/Legal Involvement Pertinent to Current Situation/Hospitalization:  No - Comment as needed Significant Relationships:  Siblings, Pets Lives with:  Self Do you feel safe going back to the place where you live?  No Need for family participation in patient care:  No (Coment)  Care giving concerns:  Pt lives alone at home with her 72 year old dog.    Social Worker assessment / plan:  CSW met with Pt along with RNCM to discuss dc plans. Pt lives at home alone with her dog of 72 years whom she got a year before her husband died. Pt's main emotional support is her sister [name] who lives in [place]. CSW discussed recommendation for SNF with Pt. Pt tearful, concerned with dog's care while she is in SNF. Pt's dog is currently at the vet having surgery today. Pt mainly concerned with expense of boarding her dog while she is in rehab. CSW suggested contacting vet regarding situation and also offered information on [facility]. CSW did a bed search and Pt has selected [facility] where she has been before.   Employment status:  Retired Nurse, adult PT Recommendations:  [place] / Referral to community resources:  [place]  Patient/Family's Response to  care:  Pt tearful, having a hard time while in the hospital. Pt appreciative of staff and engaged with staff though.   Patient/Family's Understanding of and Emotional Response to Diagnosis, Current Treatment, and Prognosis:  Pt states that she doesn't really know what is going on with her, what is heart failure and what caused her's. Basic education provided by RN CM.   Emotional Assessment Appearance:  Appears stated age Attitude/Demeanor/Rapport:  Apprehensive, Crying Affect (typically observed):  Anxious, Apprehensive, Overwhelmed, Grieving Orientation:  Oriented to Self, Oriented to Place, Oriented to  Time, Oriented to Situation Alcohol / Substance use:  Never Used Psych involvement (Current and /or in the community):  No (Comment)  Discharge Needs  Concerns to be addressed:  Adjustment to Illness Readmission within the last 30 days:  No Current discharge risk:  Chronically ill, Dependent with Mobility Barriers to Discharge:  Barriers Resolved   Alonna Buckler, LCSW 09/09/2015, 12:12 PM

## 2015-09-09 NOTE — Clinical Social Work Placement (Signed)
   CLINICAL SOCIAL WORK PLACEMENT  NOTE  Date:  09/09/2015  Patient Details  Name: Carolyn Valdez MRN: [id number] Date of Birth: 1941-08-25  Clinical Social Work is seeking post-discharge placement for this patient at the [place] level of care (*CSW will initial, date and re-position this form in  chart as items are completed):      Patient/family provided with [facility] Work Department's list of facilities offering this level of care within the geographic area requested by the patient (or if unable, by the patient's family).      Patient/family informed of their freedom to choose among providers that offer the needed level of care, that participate in Medicare, Medicaid or managed care program needed by the patient, have an available bed and are willing to accept the patient.      Patient/family informed of [facility]'s ownership interest in [facility] and [facility], as well as of the fact that they are under no obligation to receive care at these facilities.  PASRR submitted to EDS on       PASRR number received on       Existing PASRR number confirmed on 09/08/15     FL2 transmitted to all facilities in geographic area requested by pt/family on 09/08/15     FL2 transmitted to all facilities within larger geographic area on       Patient informed that his/her managed care company has contracts with or will negotiate with certain facilities, including the following:        Yes   Patient/family informed of bed offers received.  Patient chooses bed at [facility]     Physician recommends and patient chooses bed at      Patient to be [facility] to [facility] on 09/09/15.  Patient to be transferred to facility by EMS     Patient family notified on 09/09/15 of transfer.  Name of family member notified:  Sister [name]     PHYSICIAN Please sign FL2, Please sign DNR, Please prepare priority discharge  summary, including medications     Additional Comment:    _______________________________________________ Alonna Buckler, LCSW 09/09/2015, 12:09 PM

## 2015-09-09 NOTE — Progress Notes (Signed)
SNF and Non-Emergent EMS Transport Benefits:  Number called: [phone] Rep: Nadene Reference Number: [id number]  [place] Complete HMO Plan One active as of 10/18/14 with no deductible.  Out of pocket max is $4900, of which $1471.02 met so far.  In-network SNF: $0 copay for days 1-20, a $160 daily copay for days 21-51, and a $0 copay for days 52-100.  Once out of pocket is reached, patient covered at 100% for remainder of 100 day benefit period.  $0 copay for professional fees and 3 day hospital stay is not required. Per rep, all 100 days available.  [name] is required: [phone].    Non-emergent EMS transport: $250 copay for each one way medically necessary, Medicare covered trip.  [name] is required: [phone].

## 2015-09-09 NOTE — Discharge Summary (Signed)
[facility] [place] NAME: Carolyn Valdez    MR#:  [id number]  DATE OF BIRTH:  1941/03/23  DATE OF ADMISSION:  09/05/2015 ADMITTING PHYSICIAN: Loletha Grayer, MD  DATE OF DISCHARGE: 09/09/2015  PRIMARY CARE PHYSICIAN: Adrian Prows, MD    ADMISSION DIAGNOSIS:  Hypoxia [R09.02] Troponin level elevated [R79.89] Acute on chronic congestive heart failure, unspecified congestive heart failure type ([place]) [I50.9]  DISCHARGE DIAGNOSIS:  Active Problems:   Acute respiratory failure with hypoxia ([place])   SECONDARY DIAGNOSIS:   Past Medical History  Diagnosis Date  . Diabetes mellitus without complication ([place])   . Breast cancer ([place])     16 yrs ago  . Hypercholesteremia   . DJD (degenerative joint disease)   . SVT (supraventricular tachycardia) ([place])   . Kidney stone   . H/O total knee replacement     right  . Dysuria   . HTN (hypertension)   . CHF (congestive heart failure) ([place])   . Anxiety   . Yeast vaginitis   . Vaginal atrophy   . Recurrent urinary tract infection   . Obesity   . Urinary incontinence     HOSPITAL COURSE:   74 year old female with past medical history of CHF, hypertension, morbid obesity, history of breast cancer, type 2 diabetes without dictation, DJD, recurrent urinary tract infections, osteoarthritis, history of SVT, urinary incontinence who presented to the hospital due to weakness and shortness of breath and noted to be in congestive heart failure.  #1 acute CHF-patient presented to the hospital with shortness of breath and weakness. She was noted to be in acute on chronic diastolic CHF. She she was diuresed with IV Lasix and has clinically improved and is about 6 L negative since admission. -Clinically her symptoms haven't improved. -At this time patient is being discharged on oral Lasix, metoprolol, losartan.  -I suspect she has undiagnosed sleep apnea and she probably needs to be tested for that  as an outpatient.  #2 generalized weakness-this is likely multifactorial and probably related to underlying CHF combined with morbid obesity and deconditioning. -Patient was evaluate by physical therapy and recommended short-term rehabilitation which is where she is being discharged presently.  #3 Fever of unknown origin - pt. Had low grade fever of 100.9 on admission and also on hospital day 2. The source has been unclear but her fevers have not resolved and she's been afebrile for 48 hours.  - Patient had a urinalysis and urine culture which have been negative. Her chest x-ray was negative for any acute pneumonia. She also had Dopplers of her lower extremities which are negative for any acute DVT.  #4 type 2 diabetes without complication-while in the hospital patient was on Lantus and sliding scale insulin. -She will resume her metformin, Actos, Lantus upon discharge.  #5 diabetic neuropathy-patient will continue Neurontin.  #6 hypertension- remained hemodynamically stable.  - she will continue losartan, metoprolol, Norvasc.  #7 GERD- pt. Will continue Protonix.  #8 history of breast cancer- pt. Will continue Evista.  #9 elevated troponin-likely in setting of demand ischemia from CHF. No acute chest pain, troponins did not trend upwards. Echocardiogram showing normal ejection fraction with no wall motion abnormalities.     DISCHARGE CONDITIONS:   Stable  CONSULTS OBTAINED:     DRUG ALLERGIES:   Allergies  Allergen Reactions  . Sulfa Antibiotics Hives  . Biaxin [Clarithromycin] Hives  . Eggs Or Egg-Derived Products Other (See Comments)    Pt states that  her allergy test said she was allergic to eggs.    . Influenza A (H1n1) Mon[place] Vac Other (See Comments)    Pt states that she was told by her MD not to get the influenza vaccine.    . Morphine Other (See Comments)    Reaction:  Dizziness and confusion   . Pyridium [Phenazopyridine Hcl] Other (See Comments)    Reaction:   Unknown   . Ceftriaxone Anxiety  . Latex Rash  . Prednisone Rash  . Tape Rash    DISCHARGE MEDICATIONS:   Current Discharge Medication List    CONTINUE these medications which have CHANGED   Details  !! clonazePAM (KLONOPIN) 1 MG tablet Take 1 tablet (1 mg total) by mouth 3 (three) times daily. Qty: 30 tablet, Refills: 0    furosemide (LASIX) 40 MG tablet Take 1 tablet (40 mg total) by mouth daily. Qty: 30 tablet     !! - Potential duplicate medications found. Please discuss with provider.    CONTINUE these medications which have NOT CHANGED   Details  amLODipine (NORVASC) 5 MG tablet Take 5 mg by mouth daily.    atorvastatin (LIPITOR) 20 MG tablet Take 20 mg by mouth at bedtime.    !! clonazePAM (KLONOPIN) 1 MG tablet Take 1 mg by mouth 3 (three) times daily.    conjugated estrogens (PREMARIN) vaginal cream Place 1 Applicatorful vaginally 3 (three) times a week. Pt uses on Monday, Wednesday, and Friday.    desmopressin (DDAVP) 0.2 MG tablet Take 0.2 mg by mouth at bedtime.    docusate sodium (COLACE) 100 MG capsule Take 200 mg by mouth 2 (two) times daily.    fexofenadine (ALLEGRA) 180 MG tablet Take 180 mg by mouth daily.    gabapentin (NEURONTIN) 300 MG capsule Take 300-600 mg by mouth 3 (three) times daily. Pt takes two capsules in the morning, one capsule at lunch, and two capsules in the evening.    hydrOXYzine (ATARAX/VISTARIL) 25 MG tablet Take 25 mg by mouth at bedtime.     insulin glargine (LANTUS) 100 UNIT/ML injection Inject 50 Units into the skin at bedtime.    iron polysaccharides (NIFEREX) 150 MG capsule Take 150 mg by mouth daily.    losartan (COZAAR) 100 MG tablet Take 100 mg by mouth daily.    metFORMIN (GLUCOPHAGE) 1000 MG tablet Take 1,000 mg by mouth 2 (two) times daily with a meal.    metoprolol succinate (TOPROL-XL) 100 MG 24 hr tablet Take 100 mg by mouth 2 (two) times daily.     mirabegron ER (MYRBETRIQ) 50 MG TB24 tablet Take 50 mg by mouth  every evening.    omeprazole (PRILOSEC) 40 MG capsule Take 40 mg by mouth 2 (two) times daily.    pioglitazone (ACTOS) 30 MG tablet Take 30 mg by mouth daily.    raloxifene (EVISTA) 60 MG tablet Take 60 mg by mouth at bedtime.    solifenacin (VESICARE) 10 MG tablet Take 10 mg by mouth at bedtime.    vitamin E 1000 UNIT capsule Take 1,000 Units by mouth daily.     !! - Potential duplicate medications found. Please discuss with provider.    STOP taking these medications     amoxicillin-clavulanate (AUGMENTIN) 500-125 MG tablet          DISCHARGE INSTRUCTIONS:   DIET:  Cardiac diet and Diabetic diet  DISCHARGE CONDITION:  Stable  ACTIVITY:  Activity as tolerated  OXYGEN:  Home Oxygen: No.   Oxygen Delivery: room  air  DISCHARGE LOCATION:  nursing home   If you experience worsening of your admission symptoms, develop shortness of breath, life threatening emergency, suicidal or homicidal thoughts you must seek medical attention immediately by calling 911 or calling your MD immediately  if symptoms less severe.  You Must read complete instructions/literature along with all the possible adverse reactions/side effects for all the Medicines you take and that have been prescribed to you. Take any new Medicines after you have completely understood and accpet all the possible adverse reactions/side effects.   Please note  You were cared for by a hospitalist during your hospital stay. If you have any questions about your discharge medications or the care you received while you were in the hospital after you are discharged, you can call the unit and asked to speak with the hospitalist on call if the hospitalist that took care of you is not available. Once you are discharged, your primary care physician will handle any further medical issues. Please note that NO REFILLS for any discharge medications will be authorized once you are discharged, as it is imperative that you return to your  primary care physician (or establish a relationship with a primary care physician if you do not have one) for your aftercare needs so that they can reassess your need for medications and monitor your lab values.     Today   Shortness of breath improved, no fever overnight.  VITAL SIGNS:  Blood pressure 135/38, pulse 66, temperature 99.3 F (37.4 C), temperature source Oral, resp. rate 18, height 5\' 7"  (1.702 m), weight 142.883 kg (315 lb), SpO2 91 %.  I/O:   Intake/Output Summary (Last 24 hours) at 09/09/15 1022 Last data filed at 09/09/15 0900  Gross per 24 hour  Intake    240 ml  Output      0 ml  Net    240 ml    PHYSICAL EXAMINATION:    GENERAL: 74 y.o.-year-old obese patient sitting up in chair in no acute distress.  EYES: Pupils equal, round, reactive to light and accommodation. No scleral icterus. Extraocular muscles intact.  HEENT: Head atraumatic, normocephalic. Oropharynx and nasopharynx clear.  NECK: Supple, no jugular venous distention. No thyroid enlargement, no tenderness.  LUNGS: Bibasilar Rales improving, no rhonchi, wheezing. No use of accessory muscles of respiration.  CARDIOVASCULAR: S1, S2 normal. II/VI SEM at LSB, No rubs, gallops.  ABDOMEN: Soft, nontender, nondistended. Bowel sounds present. No organomegaly or mass.  EXTREMITIES: No cyanosis, clubbing, +1 pitting edema bilaterally.  NEUROLOGIC: Cranial nerves II through XII are intact. No focal Motor or sensory deficits b/l. Globally weak PSYCHIATRIC: The patient is alert and oriented x 3. Good affect SKIN: No obvious rash, lesion, or ulcer.   DATA REVIEW:   CBC  Recent Labs Lab 09/08/15 0431  WBC 5.8  HGB 9.2*  HCT 28.2*  PLT 238    Chemistries   Recent Labs Lab 09/09/15 0452  NA 138  K 3.3*  CL 92*  CO2 36*  GLUCOSE 224*  BUN 26*  CREATININE 1.35*  CALCIUM 9.0    Cardiac Enzymes  Recent Labs Lab 09/06/15 0126  TROPONINI 0.15*    Microbiology Results   Results for orders placed or performed during the hospital encounter of 09/05/15  Urine culture     Status: None   Collection Time: 09/05/15  4:15 PM  Result Value Ref Range Status   Specimen Description URINE, CATHETERIZED  Final   Special Requests Normal  Final  Culture NO GROWTH 2 DAYS  Final   Report Status 09/07/2015 FINAL  Final    RADIOLOGY:  Dg Chest 1 View  09/08/2015  CLINICAL DATA:  Fevers of unknown origin EXAM: CHEST  1 VIEW COMPARISON:  09/05/2015 FINDINGS: Cardiac shadow remains mildly enlarged. Aortic calcifications are again seen. The lungs are well aerated bilaterally. Mild increased interstitial changes are seen stable from the previous exam. No focal confluent infiltrate is noted. No bony abnormality is seen. IMPRESSION: Stable appearance of the chest from the previous exam. No new focal infiltrate is seen. Electronically Signed   By: Inez Catalina M.D.   On: 09/08/2015 07:20   US Venous Img Lower Bilateral  09/07/2015  CLINICAL DATA:  Pain and swelling of the lower extremities. History of breast cancer. EXAM: BILATERAL LOWER EXTREMITY VENOUS DOPPLER ULTRASOUND TECHNIQUE: Gray-scale sonography with graded compression, as well as color Doppler and duplex ultrasound were performed to evaluate the lower extremity deep venous systems from the level of the common femoral vein and including the common femoral, femoral, profunda femoral, popliteal and calf veins including the posterior tibial, peroneal and gastrocnemius veins when visible. The superficial great saphenous vein was also interrogated. Spectral Doppler was utilized to evaluate flow at rest and with distal augmentation maneuvers in the common femoral, femoral and popliteal veins. COMPARISON:  05/23/2006 FINDINGS: RIGHT LOWER EXTREMITY Common Femoral Vein: No evidence of thrombus. Normal compressibility, respiratory phasicity and response to augmentation. Saphenofemoral Junction: No evidence of thrombus. Normal  compressibility and flow on color Doppler imaging. Profunda Femoral Vein: No evidence of thrombus. Normal compressibility and flow on color Doppler imaging. Femoral Vein: No evidence of thrombus. Normal compressibility, respiratory phasicity and response to augmentation. Popliteal Vein: No evidence of thrombus. Normal compressibility, respiratory phasicity and response to augmentation. Calf Veins: Not visualized due to patient's body habitus. Superficial Great Saphenous Vein: No evidence of thrombus. Normal compressibility and flow on color Doppler imaging. Venous Reflux:  None. Other Findings:  None. LEFT LOWER EXTREMITY Common Femoral Vein: No evidence of thrombus. Normal compressibility, respiratory phasicity and response to augmentation. Saphenofemoral Junction: No evidence of thrombus. Normal compressibility and flow on color Doppler imaging. Profunda Femoral Vein: No evidence of thrombus. Normal compressibility and flow on color Doppler imaging. Femoral Vein: No evidence of thrombus. Normal compressibility, respiratory phasicity and response to augmentation. Popliteal Vein: No evidence of thrombus. Normal compressibility, respiratory phasicity and response to augmentation. Calf Veins: Not visualized due to patient's body habitus. Superficial Great Saphenous Vein: No evidence of thrombus. Normal compressibility and flow on color Doppler imaging. Venous Reflux:  None. Other Findings:  None. IMPRESSION: No evidence of deep venous thrombosis bilaterally, accounting for non visualization of the calf veins. Electronically Signed   By: Fidela Salisbury M.D.   On: 09/07/2015 14:58      Management plans discussed with the patient, family and they are in agreement.  CODE STATUS:     Code Status Orders        Start     Ordered   09/05/15 [id number]  Do not attempt resuscitation (DNR)   Continuous    Question Answer Comment  In the event of cardiac or respiratory ARREST Do not call a "code blue"   In the  event of cardiac or respiratory ARREST Do not perform Intubation, CPR, defibrillation or ACLS   In the event of cardiac or respiratory ARREST Use medication by any route, position, wound care, and other measures to relive pain and suffering. May use oxygen, suction  and manual treatment of airway obstruction as needed for comfort.   Comments Nurse may pronounce      09/05/15 1756    Advance Directive Documentation        Most Recent Value   Type of Advance Directive  Living will   Pre-existing out of facility DNR order (yellow form or pink MOST form)     "MOST" Form in Place?        TOTAL TIME TAKING CARE OF THIS PATIENT: 40 minutes.    Henreitta Leber M.D on 09/09/2015 at 10:22 AM  Between 7am to 6pm - Pager - [phone]  After 6pm go to [email or web address] - password [email or web address] [facility]  [facility]  Office  [phone]  CC: Primary care physician; Adrian Prows, MD

## 2015-09-09 NOTE — Progress Notes (Signed)
A&0, VSS, no complaints of pain. Removed telemetry and removed PIV.  Report called to [facility].  Rx in packet for klonopin.  No questions at this time.  Patient to be escorted out of hospital via EMS.

## 2015-09-23 ENCOUNTER — Other Ambulatory Visit: Payer: Self-pay | Admitting: Urology

## 2015-09-23 DIAGNOSIS — N3281 Overactive bladder: Secondary | ICD-10-CM

## 2015-09-30 ENCOUNTER — Emergency Department: Payer: Medicare Other

## 2015-09-30 ENCOUNTER — Encounter: Payer: Self-pay | Admitting: *Deleted

## 2015-09-30 ENCOUNTER — Inpatient Hospital Stay
Admission: EM | Admit: 2015-09-30 | Discharge: 2015-10-07 | DRG: 202 | Disposition: A | Discharge status: Home Health | Payer: Medicare Other | Attending: Specialist | Admitting: Specialist

## 2015-09-30 DIAGNOSIS — Z853 Personal history of malignant neoplasm of breast: Secondary | ICD-10-CM | POA: Diagnosis not present

## 2015-09-30 DIAGNOSIS — Z885 Allergy status to narcotic agent status: Secondary | ICD-10-CM | POA: Diagnosis not present

## 2015-09-30 DIAGNOSIS — F419 Anxiety disorder, unspecified: Secondary | ICD-10-CM | POA: Diagnosis present

## 2015-09-30 DIAGNOSIS — I5032 Chronic diastolic (congestive) heart failure: Secondary | ICD-10-CM | POA: Diagnosis present

## 2015-09-30 DIAGNOSIS — Z87891 Personal history of nicotine dependence: Secondary | ICD-10-CM | POA: Diagnosis not present

## 2015-09-30 DIAGNOSIS — Z881 Allergy status to other antibiotic agents status: Secondary | ICD-10-CM | POA: Diagnosis not present

## 2015-09-30 DIAGNOSIS — D696 Thrombocytopenia, unspecified: Secondary | ICD-10-CM | POA: Diagnosis present

## 2015-09-30 DIAGNOSIS — J9601 Acute respiratory failure with hypoxia: Secondary | ICD-10-CM | POA: Diagnosis present

## 2015-09-30 DIAGNOSIS — Z6841 Body Mass Index (BMI) 40.0 and over, adult: Secondary | ICD-10-CM | POA: Diagnosis not present

## 2015-09-30 DIAGNOSIS — G629 Polyneuropathy, unspecified: Secondary | ICD-10-CM | POA: Diagnosis present

## 2015-09-30 DIAGNOSIS — Z882 Allergy status to sulfonamides status: Secondary | ICD-10-CM | POA: Diagnosis not present

## 2015-09-30 DIAGNOSIS — K219 Gastro-esophageal reflux disease without esophagitis: Secondary | ICD-10-CM | POA: Diagnosis present

## 2015-09-30 DIAGNOSIS — Z9049 Acquired absence of other specified parts of digestive tract: Secondary | ICD-10-CM

## 2015-09-30 DIAGNOSIS — M1990 Unspecified osteoarthritis, unspecified site: Secondary | ICD-10-CM | POA: Diagnosis present

## 2015-09-30 DIAGNOSIS — E785 Hyperlipidemia, unspecified: Secondary | ICD-10-CM | POA: Diagnosis present

## 2015-09-30 DIAGNOSIS — Z91012 Allergy to eggs: Secondary | ICD-10-CM

## 2015-09-30 DIAGNOSIS — Z91040 Latex allergy status: Secondary | ICD-10-CM | POA: Diagnosis not present

## 2015-09-30 DIAGNOSIS — E7800 Pure hypercholesterolemia, unspecified: Secondary | ICD-10-CM | POA: Diagnosis present

## 2015-09-30 DIAGNOSIS — Z96651 Presence of right artificial knee joint: Secondary | ICD-10-CM | POA: Diagnosis present

## 2015-09-30 DIAGNOSIS — Z888 Allergy status to other drugs, medicaments and biological substances status: Secondary | ICD-10-CM

## 2015-09-30 DIAGNOSIS — I509 Heart failure, unspecified: Secondary | ICD-10-CM

## 2015-09-30 DIAGNOSIS — J209 Acute bronchitis, unspecified: Principal | ICD-10-CM | POA: Diagnosis present

## 2015-09-30 DIAGNOSIS — D509 Iron deficiency anemia, unspecified: Secondary | ICD-10-CM | POA: Diagnosis present

## 2015-09-30 DIAGNOSIS — Z79899 Other long term (current) drug therapy: Secondary | ICD-10-CM

## 2015-09-30 DIAGNOSIS — E119 Type 2 diabetes mellitus without complications: Secondary | ICD-10-CM | POA: Diagnosis present

## 2015-09-30 DIAGNOSIS — I110 Hypertensive heart disease with heart failure: Secondary | ICD-10-CM | POA: Diagnosis present

## 2015-09-30 DIAGNOSIS — Z7984 Long term (current) use of oral hypoglycemic drugs: Secondary | ICD-10-CM | POA: Diagnosis not present

## 2015-09-30 DIAGNOSIS — R0600 Dyspnea, unspecified: Secondary | ICD-10-CM | POA: Diagnosis present

## 2015-09-30 DIAGNOSIS — Z794 Long term (current) use of insulin: Secondary | ICD-10-CM

## 2015-09-30 DIAGNOSIS — Z887 Allergy status to serum and vaccine status: Secondary | ICD-10-CM

## 2015-09-30 DIAGNOSIS — Z9981 Dependence on supplemental oxygen: Secondary | ICD-10-CM | POA: Diagnosis not present

## 2015-09-30 LAB — BASIC METABOLIC PANEL
ANION GAP: 10 (ref 5–15)
BUN: 25 mg/dL — ABNORMAL HIGH (ref 6–20)
CALCIUM: 9.4 mg/dL (ref 8.9–10.3)
CO2: 28 mmol/L (ref 22–32)
CREATININE: 1.02 mg/dL — AB (ref 0.44–1.00)
Chloride: 99 mmol/L — ABNORMAL LOW (ref 101–111)
GFR, EST NON AFRICAN AMERICAN: 54 mL/min — AB (ref >60)
Glucose, Bld: 208 mg/dL — ABNORMAL HIGH (ref 65–99)
Potassium: 4.3 mmol/L (ref 3.5–5.1)
SODIUM: 137 mmol/L (ref 135–145)

## 2015-09-30 LAB — URINALYSIS COMPLETE WITH MICROSCOPIC (ARMC ONLY)
BACTERIA UA: NONE SEEN
Bilirubin Urine: NEGATIVE
GLUCOSE, UA: 50 mg/dL — AB
KETONES UR: NEGATIVE mg/dL
LEUKOCYTES UA: NEGATIVE
NITRITE: NEGATIVE
Protein, ur: 30 mg/dL — AB
SPECIFIC GRAVITY, URINE: 1.006 (ref 1.005–1.030)
pH: 5 (ref 5.0–8.0)

## 2015-09-30 LAB — CBC
HEMATOCRIT: 32 % — AB (ref 35.0–47.0)
Hemoglobin: 10.5 g/dL — ABNORMAL LOW (ref 12.0–16.0)
MCH: 28.7 pg (ref 26.0–34.0)
MCHC: 32.8 g/dL (ref 32.0–36.0)
MCV: 87.5 fL (ref 80.0–100.0)
Platelets: 148 10*3/uL — ABNORMAL LOW (ref 150–440)
RBC: 3.66 MIL/uL — AB (ref 3.80–5.20)
RDW: 15.2 % — ABNORMAL HIGH (ref 11.5–14.5)
WBC: 4.6 10*3/uL (ref 3.6–11.0)

## 2015-09-30 LAB — INFLUENZA PANEL BY PCR (TYPE A & B)
H1N1 flu by pcr: NOT DETECTED
INFLAPCR: NEGATIVE
Influenza B By PCR: NEGATIVE

## 2015-09-30 LAB — GLUCOSE, CAPILLARY: GLUCOSE-CAPILLARY: 216 mg/dL — AB (ref 65–99)

## 2015-09-30 LAB — TROPONIN I
Troponin I: <0.03 ng/mL (ref <0.031)
Troponin I: <0.03 ng/mL (ref <0.031)

## 2015-09-30 LAB — BRAIN NATRIURETIC PEPTIDE: B NATRIURETIC PEPTIDE 5: 230 pg/mL — AB (ref 0.0–100.0)

## 2015-09-30 MED ORDER — DOCUSATE SODIUM 100 MG PO CAPS
200.0000 mg | ORAL_CAPSULE | Freq: Two times a day (BID) | ORAL | Status: DC
  Administered 2015-09-30 – 2015-10-07 (×12): 200 mg via ORAL
  Filled 2015-09-30 (×14): qty 2

## 2015-09-30 MED ORDER — LOSARTAN POTASSIUM 50 MG PO TABS
100.0000 mg | ORAL_TABLET | Freq: Every day | ORAL | Status: DC
  Administered 2015-10-01 – 2015-10-06 (×6): 100 mg via ORAL
  Filled 2015-09-30 (×6): qty 2

## 2015-09-30 MED ORDER — CLONAZEPAM 1 MG PO TABS
1.0000 mg | ORAL_TABLET | Freq: Three times a day (TID) | ORAL | Status: DC
  Administered 2015-09-30 – 2015-10-07 (×20): 1 mg via ORAL
  Filled 2015-09-30 (×21): qty 1

## 2015-09-30 MED ORDER — ACETAMINOPHEN 325 MG PO TABS
650.0000 mg | ORAL_TABLET | Freq: Four times a day (QID) | ORAL | Status: DC | PRN
  Administered 2015-09-30 – 2015-10-06 (×8): 650 mg via ORAL
  Filled 2015-09-30 (×8): qty 2

## 2015-09-30 MED ORDER — RALOXIFENE HCL 60 MG PO TABS
60.0000 mg | ORAL_TABLET | Freq: Every day | ORAL | Status: DC
  Administered 2015-09-30 – 2015-10-06 (×7): 60 mg via ORAL
  Filled 2015-09-30 (×7): qty 1

## 2015-09-30 MED ORDER — AMLODIPINE BESYLATE 5 MG PO TABS
5.0000 mg | ORAL_TABLET | Freq: Every day | ORAL | Status: DC
  Administered 2015-10-01 – 2015-10-06 (×6): 5 mg via ORAL
  Filled 2015-09-30 (×6): qty 1

## 2015-09-30 MED ORDER — GABAPENTIN 300 MG PO CAPS
600.0000 mg | ORAL_CAPSULE | Freq: Every morning | ORAL | Status: DC
  Administered 2015-10-01 – 2015-10-07 (×7): 600 mg via ORAL
  Filled 2015-09-30 (×9): qty 2

## 2015-09-30 MED ORDER — METOPROLOL SUCCINATE ER 100 MG PO TB24
100.0000 mg | ORAL_TABLET | Freq: Two times a day (BID) | ORAL | Status: DC
  Filled 2015-09-30: qty 1

## 2015-09-30 MED ORDER — LORATADINE 10 MG PO TABS
10.0000 mg | ORAL_TABLET | Freq: Every day | ORAL | Status: DC
  Administered 2015-10-01 – 2015-10-07 (×7): 10 mg via ORAL
  Filled 2015-09-30 (×7): qty 1

## 2015-09-30 MED ORDER — POLYSACCHARIDE IRON COMPLEX 150 MG PO CAPS
150.0000 mg | ORAL_CAPSULE | Freq: Every day | ORAL | Status: DC
  Administered 2015-09-30 – 2015-10-07 (×8): 150 mg via ORAL
  Filled 2015-09-30 (×8): qty 1

## 2015-09-30 MED ORDER — GABAPENTIN 300 MG PO CAPS
600.0000 mg | ORAL_CAPSULE | Freq: Every evening | ORAL | Status: DC
  Administered 2015-09-30 – 2015-10-06 (×7): 600 mg via ORAL
  Filled 2015-09-30 (×6): qty 2

## 2015-09-30 MED ORDER — VITAMIN E 45 MG (100 UNIT) PO CAPS
1000.0000 [IU] | ORAL_CAPSULE | Freq: Every day | ORAL | Status: DC
  Administered 2015-09-30 – 2015-10-07 (×8): 1000 [IU] via ORAL
  Filled 2015-09-30 (×9): qty 2

## 2015-09-30 MED ORDER — ACETAMINOPHEN 650 MG RE SUPP: 650.0000 mg | Freq: Four times a day (QID) | RECTAL | Status: DC | PRN

## 2015-09-30 MED ORDER — GABAPENTIN 300 MG PO CAPS
300.0000 mg | ORAL_CAPSULE | Freq: Every day | ORAL | Status: DC
  Administered 2015-10-01 – 2015-10-05 (×5): 300 mg via ORAL
  Filled 2015-09-30 (×5): qty 1

## 2015-09-30 MED ORDER — INSULIN GLARGINE 100 UNIT/ML ~~LOC~~ SOLN
50.0000 [IU] | Freq: Every day | SUBCUTANEOUS | Status: DC
  Administered 2015-09-30 – 2015-10-06 (×7): 50 [IU] via SUBCUTANEOUS
  Filled 2015-09-30 (×9): qty 0.5

## 2015-09-30 MED ORDER — METFORMIN HCL 500 MG PO TABS
1000.0000 mg | ORAL_TABLET | Freq: Two times a day (BID) | ORAL | Status: DC
  Administered 2015-09-30 – 2015-10-07 (×14): 1000 mg via ORAL
  Filled 2015-09-30 (×14): qty 2

## 2015-09-30 MED ORDER — PANTOPRAZOLE SODIUM 40 MG PO TBEC
40.0000 mg | DELAYED_RELEASE_TABLET | Freq: Every day | ORAL | Status: DC
  Administered 2015-09-30 – 2015-10-07 (×8): 40 mg via ORAL
  Filled 2015-09-30 (×8): qty 1

## 2015-09-30 MED ORDER — INSULIN ASPART 100 UNIT/ML ~~LOC~~ SOLN
0.0000 [IU] | Freq: Three times a day (TID) | SUBCUTANEOUS | Status: DC
  Administered 2015-09-30 – 2015-10-02 (×4): 3 [IU] via SUBCUTANEOUS
  Administered 2015-10-03: 2 [IU] via SUBCUTANEOUS
  Administered 2015-10-03: 5 [IU] via SUBCUTANEOUS
  Administered 2015-10-05: 19:00:00 3 [IU] via SUBCUTANEOUS
  Administered 2015-10-05: 5 [IU] via SUBCUTANEOUS
  Administered 2015-10-06: 8 [IU] via SUBCUTANEOUS
  Filled 2015-09-30: qty 5
  Filled 2015-09-30: qty 8
  Filled 2015-09-30: qty 2
  Filled 2015-09-30 (×2): qty 3
  Filled 2015-09-30: qty 5
  Filled 2015-09-30: qty 2
  Filled 2015-09-30: qty 5
  Filled 2015-09-30: qty 2
  Filled 2015-09-30 (×4): qty 3
  Filled 2015-09-30: qty 2

## 2015-09-30 MED ORDER — FUROSEMIDE 10 MG/ML IJ SOLN
60.0000 mg | Freq: Once | INTRAMUSCULAR | Status: AC
Start: 2015-09-30 — End: 2015-09-30
  Administered 2015-09-30: 60 mg via INTRAVENOUS
  Filled 2015-09-30: qty 8

## 2015-09-30 MED ORDER — FUROSEMIDE 10 MG/ML IJ SOLN
20.0000 mg | Freq: Two times a day (BID) | INTRAMUSCULAR | Status: DC
  Administered 2015-10-01 – 2015-10-02 (×3): 20 mg via INTRAVENOUS
  Filled 2015-09-30 (×3): qty 2

## 2015-09-30 MED ORDER — LEVOFLOXACIN 500 MG PO TABS
500.0000 mg | ORAL_TABLET | Freq: Once | ORAL | Status: AC
  Administered 2015-09-30: 500 mg via ORAL
  Filled 2015-09-30: qty 1

## 2015-09-30 MED ORDER — ENOXAPARIN SODIUM 40 MG/0.4ML ~~LOC~~ SOLN
40.0000 mg | SUBCUTANEOUS | Status: DC
  Administered 2015-09-30: 40 mg via SUBCUTANEOUS
  Filled 2015-09-30: qty 0.4

## 2015-09-30 MED ORDER — METOPROLOL SUCCINATE ER 50 MG PO TB24
50.0000 mg | ORAL_TABLET | Freq: Two times a day (BID) | ORAL | Status: DC
  Administered 2015-10-01 – 2015-10-06 (×12): 50 mg via ORAL
  Filled 2015-09-30 (×12): qty 1

## 2015-09-30 MED ORDER — DESMOPRESSIN ACETATE 0.2 MG PO TABS
0.2000 mg | ORAL_TABLET | Freq: Every day | ORAL | Status: DC
  Administered 2015-09-30 – 2015-10-06 (×7): 0.2 mg via ORAL
  Filled 2015-09-30 (×9): qty 1

## 2015-09-30 MED ORDER — HYDROXYZINE HCL 25 MG PO TABS
25.0000 mg | ORAL_TABLET | Freq: Every day | ORAL | Status: DC
  Administered 2015-09-30 – 2015-10-06 (×7): 25 mg via ORAL
  Filled 2015-09-30 (×7): qty 1

## 2015-09-30 MED ORDER — INSULIN ASPART 100 UNIT/ML ~~LOC~~ SOLN
0.0000 [IU] | Freq: Every day | SUBCUTANEOUS | Status: DC
  Administered 2015-09-30: 2 [IU] via SUBCUTANEOUS
  Filled 2015-09-30 (×3): qty 2

## 2015-09-30 MED ORDER — ESTROGENS, CONJUGATED 0.625 MG/GM VA CREA
1.0000 | TOPICAL_CREAM | VAGINAL | Status: DC
  Administered 2015-10-03 – 2015-10-06 (×2): 1 via VAGINAL
  Filled 2015-09-30: qty 30

## 2015-09-30 MED ORDER — LEVOFLOXACIN 500 MG PO TABS
500.0000 mg | ORAL_TABLET | Freq: Every day | ORAL | Status: AC
Start: 2015-10-01 — End: 2015-10-07
  Administered 2015-10-01 – 2015-10-07 (×7): 500 mg via ORAL
  Filled 2015-09-30 (×7): qty 1

## 2015-09-30 MED ORDER — SODIUM CHLORIDE 0.9 % IJ SOLN
3.0000 mL | Freq: Two times a day (BID) | INTRAMUSCULAR | Status: DC
  Administered 2015-09-30 – 2015-10-07 (×15): 3 mL via INTRAVENOUS

## 2015-09-30 MED ORDER — IPRATROPIUM-ALBUTEROL 0.5-2.5 (3) MG/3ML IN SOLN
3.0000 mL | Freq: Four times a day (QID) | RESPIRATORY_TRACT | Status: DC
  Administered 2015-09-30 – 2015-10-02 (×9): 3 mL via RESPIRATORY_TRACT
  Filled 2015-09-30 (×9): qty 3

## 2015-09-30 MED ORDER — ATORVASTATIN CALCIUM 20 MG PO TABS
20.0000 mg | ORAL_TABLET | Freq: Every day | ORAL | Status: DC
  Administered 2015-09-30 – 2015-10-06 (×7): 20 mg via ORAL
  Filled 2015-09-30 (×7): qty 1

## 2015-09-30 MED ORDER — MIRABEGRON ER 50 MG PO TB24
50.0000 mg | ORAL_TABLET | Freq: Every evening | ORAL | Status: DC
  Administered 2015-09-30 – 2015-10-06 (×7): 50 mg via ORAL
  Filled 2015-09-30 (×9): qty 1

## 2015-09-30 NOTE — Progress Notes (Signed)
Carolyn Valdez is a 74 y.o. female patient admitted from ED awake, alert - oriented  X 4 - no acute distress noted.  VSS - Blood pressure 160/58, pulse 58, temperature 97.9 F (36.6 C), temperature source Oral, resp. rate 16, height 5\' 7"  (1.702 m), weight 143.7 kg (316 lb 12.8 oz), SpO2 100 %.    IV in place, occlusive dsg intact without redness.  Orientation to room, and floor completed with information packet given to patient/family.  Admission INP armband ID verified with patient/family, and in place.   SR up x 2, fall assessment complete, with patient and family able to verbalize understanding of risk associated with falls, and verbalized understanding to call nsg before up out of bed.  Call light within reach, patient able to voice, and demonstrate understanding.  Skin, clean-dry- intact without evidence of bruising, or skin tears.  Patient skin assessed with Janett Billow C. No evidence of skin break down noted on exam.      Will cont to eval and treat per MD orders.  Horton Finer, RN 09/30/2015 6:13 PM

## 2015-09-30 NOTE — ED Provider Notes (Signed)
[facility] Emergency Department Provider Note  Time seen: 1:24 PM  I have reviewed the triage vital signs and the nursing notes.   HISTORY  Chief Complaint Fatigue    HPI Carolyn Valdez is a 74 y.o. female with a past medical history of diabetes, hypertension, hyperlipidemia, CHF, who presents the emergency department with cough and difficulty breathing. According to the patient for the past one week she has had a cough, which has progressively worsened. States significant shortness of breath especially with exertion. Denies chest pain. Denies fever. She does state cough with nasal congestion. Producing white sputum. Also states generalized weakness/fatigue which she describes as moderate.     Past Medical History  Diagnosis Date  . Diabetes mellitus without complication ([place])   . Breast cancer ([place])     16 yrs ago  . Hypercholesteremia   . DJD (degenerative joint disease)   . SVT (supraventricular tachycardia) ([place])   . Kidney stone   . H/O total knee replacement     right  . Dysuria   . HTN (hypertension)   . CHF (congestive heart failure) ([place])   . Anxiety   . Yeast vaginitis   . Vaginal atrophy   . Recurrent urinary tract infection   . Obesity   . Urinary incontinence     Patient Active Problem List   Diagnosis Date Noted  . Acute respiratory failure with hypoxia ([place]) 09/05/2015  . 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 Incontinence 04/20/2015  . Diabetes mellitus, type 2 ([place]) 04/17/2015  . ESBL (extended spectrum beta-lactamase) producing bacteria infection 04/17/2015  . BP (high blood pressure) 04/17/2015  . Frequent UTI 04/17/2015  . Absence of bladder continence 04/17/2015  . Iron deficiency anemia 03/08/2015  . Absolute anemia 09/25/2014  . Abdominal pain, lower 09/25/2014  . Urge incontinence 02/19/2013  . FOM (frequency of micturition) 02/19/2013  . Bladder infection, chronic 08/11/2012  . Difficult or painful urination 07/26/2012  .  Lower urinary tract infection 12/31/2011    Past Surgical History  Procedure Laterality Date  . Cholecystectomy    . Tonsillectomy    . Mastectomy Right   . Vocal cords    . Foot surgery    . Cardiac catheterization    . Nasal septum surgery    . Joint replacement    . Peripheral vascular catheterization N/A 07/08/2015    Procedure: PICC Line Insertion;  Surgeon: Algernon Huxley, MD;  Location: [facility];  Service: Cardiovascular;  Laterality: N/A;    Current Outpatient Rx  Name  Route  Sig  Dispense  Refill  . amLODipine (NORVASC) 5 MG tablet   Oral   Take 5 mg by mouth daily.         Marland Kitchen atorvastatin (LIPITOR) 20 MG tablet   Oral   Take 20 mg by mouth at bedtime.         . clonazePAM (KLONOPIN) 1 MG tablet   Oral   Take 1 mg by mouth 3 (three) times daily.         . clonazePAM (KLONOPIN) 1 MG tablet   Oral   Take 1 tablet (1 mg total) by mouth 3 (three) times daily.   30 tablet   0   . conjugated estrogens (PREMARIN) vaginal cream   Vaginal   Place 1 Applicatorful vaginally 3 (three) times a week. Pt uses on Monday, Wednesday, and Friday.         . desmopressin (DDAVP) 0.2 MG tablet  Oral   Take 0.2 mg by mouth at bedtime.         . docusate sodium (COLACE) 100 MG capsule   Oral   Take 200 mg by mouth 2 (two) times daily.         . fexofenadine (ALLEGRA) 180 MG tablet   Oral   Take 180 mg by mouth daily.         . furosemide (LASIX) 40 MG tablet   Oral   Take 1 tablet (40 mg total) by mouth daily.   30 tablet      . gabapentin (NEURONTIN) 300 MG capsule   Oral   Take 300-600 mg by mouth 3 (three) times daily. Pt takes two capsules in the morning, one capsule at lunch, and two capsules in the evening.         . hydrOXYzine (ATARAX/VISTARIL) 25 MG tablet   Oral   Take 25 mg by mouth at bedtime.          . insulin glargine (LANTUS) 100 UNIT/ML injection   Subcutaneous   Inject 50 Units into the skin at bedtime.         .  iron polysaccharides (NIFEREX) 150 MG capsule   Oral   Take 150 mg by mouth daily.         Marland Kitchen losartan (COZAAR) 100 MG tablet   Oral   Take 100 mg by mouth daily.         . metFORMIN (GLUCOPHAGE) 1000 MG tablet   Oral   Take 1,000 mg by mouth 2 (two) times daily with a meal.         . metoprolol succinate (TOPROL-XL) 100 MG 24 hr tablet   Oral   Take 100 mg by mouth 2 (two) times daily.          . mirabegron ER (MYRBETRIQ) 50 MG TB24 tablet   Oral   Take 50 mg by mouth every evening.         Marland Kitchen omeprazole (PRILOSEC) 40 MG capsule   Oral   Take 40 mg by mouth 2 (two) times daily.         . pioglitazone (ACTOS) 30 MG tablet   Oral   Take 30 mg by mouth daily.         . raloxifene (EVISTA) 60 MG tablet   Oral   Take 60 mg by mouth at bedtime.         . VESICARE 10 MG tablet      TAKE 1 TABLET BY MOUTH EVERY DAY   30 tablet   3   . vitamin E 1000 UNIT capsule   Oral   Take 1,000 Units by mouth daily.           Allergies Sulfa antibiotics; Biaxin; Eggs or egg-derived products; Influenza a (h1n1) monoval vac; Morphine; Pyridium; Ceftriaxone; Latex; Prednisone; and Tape  Family History  Problem Relation Age of Onset  . Lung cancer Father   . Hematuria Mother   . Kidney disease Neg Hx   . Bladder Cancer Neg Hx     Social History Social History  Substance Use Topics  . Smoking status: Former Research scientist (life sciences)  . Smokeless tobacco: Never Used     Comment: quit 20 years ago  . Alcohol Use: No    Review of Systems Constitutional: Negative for fever.. ENT: Positive for congestion Cardiovascular: Negative for chest pain. Respiratory: Positive for cough. Positive for shortness of breath, worse with exertion. Gastrointestinal: Negative  for abdominal pain Musculoskeletal: Negative for back pain. Neurological: Negative for headache 10-point ROS otherwise negative.  ____________________________________________   PHYSICAL EXAM:  VITAL SIGNS: ED Triage  Vitals  Enc Vitals Group     BP 09/30/15 1221 166/65 mmHg     Pulse Rate 09/30/15 1221 84     Resp 09/30/15 1221 22     Temp 09/30/15 1221 99.3 F (37.4 C)     Temp Source 09/30/15 1221 Oral     SpO2 09/30/15 1216 94 %     Weight 09/30/15 1221 316 lb 12.8 oz (143.7 kg)     Height 09/30/15 1221 5\' 7"  (1.702 m)     Head Cir --      Peak Flow --      Pain Score --      Pain Loc --      Pain Edu? --      Excl. in [place]? --     Constitutional: Alert and oriented. Well appearing and in no distress. Eyes: Normal exam ENT   Head: Normocephalic and atraumatic.   Mouth/Throat: Mucous membranes are moist. Cardiovascular: Normal rate, regular rhythm.  Respiratory: Mild tachypnea, mild expiratory wheeze bilaterally. Frequent wet sounding cough however no rales or rhonchi on exam. Gastrointestinal: Soft and nontender. No distention.  Musculoskeletal: Nontender with normal range of motion in all extremities.  Neurologic:  Normal speech and language. No gross focal neurologic deficits  Skin:  Skin is warm, dry and intact.  Psychiatric: Mood and affect are normal. Speech and behavior are normal.  ____________________________________________    EKG  EKG reviewed and interpreted by myself shows normal sinus rhythm 82 bpm, narrow QRS, normal axis, normal intervals, nonspecific ST changes. No ST elevations.  ____________________________________________    RADIOLOGY  Chest x-ray shows chronic failure. No new abnormality. There is pulmonary vascular congestion.  ____________________________________________    INITIAL IMPRESSION / ASSESSMENT AND PLAN / ED COURSE  Pertinent labs & imaging results that were available during my care of the patient were reviewed by me and considered in my medical decision making (see chart for details).  Patient presents with 1 week of worsening cough and congestion along with shortness of breath worse with exertion. Patient's chest x-ray shows vascular  congestion, significant lower extremity edema on exam. Borderline low-grade fever 99.3 in the emergency department along with cough and congestion, and a room air O2 saturation of 88-89 percent we'll place the patient on Zithromax, Lasix, and plan to admit to the hospital for further workup. Labs are currently within normal limits.  ____________________________________________   FINAL CLINICAL IMPRESSION(S) / ED DIAGNOSES  Congestive heart failure exacerbation Bronchitis   Harvest Dark, MD 09/30/15 1329

## 2015-09-30 NOTE — Care Management (Signed)
Patient with recent discharge from [facility] to [facility].  She was weaned from 02 while in skilled nursing.  There is concern that patient may need home 02.  Discussed that need to assess for need of home 02 to physical therapy and during progression.  Patient was being followed by [facility]  and OT.  SW was ordered but had not been assessed.  Will request adding SN and aide to home health orders at discharge.

## 2015-09-30 NOTE — H&P (Signed)
[place] at [place] NAME: Carolyn Valdez    MR#:  [id number]  DATE OF BIRTH:  1941/07/21  DATE OF ADMISSION:  09/30/2015  PRIMARY CARE PHYSICIAN: Adrian Prows, MD   REQUESTING/REFERRING PHYSICIAN: Harvest Dark  CHIEF COMPLAINT:   Chief Complaint  Patient presents with  . Fatigue    HISTORY OF PRESENT ILLNESS:  Carolyn Valdez  is a 74 y.o. female presents with shortness of breath and cough. This is been going on for the past week. She recently got out of rehabilitation. She is complaining of nasal congestion which is clear. She has a low-grade fever. She's been feeling very weak. It started off as a sore throat. Now she had shortness of breath cough and wheeze. In the ER, she was found to have a pulse ox of 88% and a chest x-ray that showed mild congestive heart failure. Hospitalist services were contacted for further evaluation.  PAST MEDICAL HISTORY:   Past Medical History  Diagnosis Date  . Diabetes mellitus without complication ([place])   . Breast cancer ([place])     16 yrs ago  . Hypercholesteremia   . DJD (degenerative joint disease)   . SVT (supraventricular tachycardia) ([place])   . Kidney stone   . H/O total knee replacement     right  . Dysuria   . HTN (hypertension)   . CHF (congestive heart failure) ([place])   . Anxiety   . Yeast vaginitis   . Vaginal atrophy   . Recurrent urinary tract infection   . Obesity   . Urinary incontinence     PAST SURGICAL HISTORY:   Past Surgical History  Procedure Laterality Date  . Cholecystectomy    . Tonsillectomy    . Mastectomy Right   . Vocal cords    . Foot surgery    . Cardiac catheterization    . Nasal septum surgery    . Joint replacement    . Peripheral vascular catheterization N/A 07/08/2015    Procedure: PICC Line Insertion;  Surgeon: Algernon Huxley, MD;  Location: [facility];  Service: Cardiovascular;  Laterality: N/A;    SOCIAL HISTORY:   Social  History  Substance Use Topics  . Smoking status: Former Research scientist (life sciences)  . Smokeless tobacco: Never Used     Comment: quit 20 years ago  . Alcohol Use: No    FAMILY HISTORY:   Family History  Problem Relation Age of Onset  . Lung cancer Father   . Hematuria Mother   . Lung cancer Mother   . Kidney disease Neg Hx   . Bladder Cancer Neg Hx     DRUG ALLERGIES:   Allergies  Allergen Reactions  . Sulfa Antibiotics Hives  . Biaxin [Clarithromycin] Hives  . Eggs Or Egg-Derived Products Other (See Comments)    Pt states that her allergy test said she was allergic to eggs.    . Influenza A (H1n1) Monoval Vac Other (See Comments)    Pt states that she was told by her MD not to get the influenza vaccine.    . Morphine Other (See Comments)    Reaction:  Dizziness and confusion   . Pyridium [Phenazopyridine Hcl] Other (See Comments)    Reaction:  Unknown   . Ceftriaxone Anxiety  . Latex Rash  . Prednisone Rash  . Tape Rash    REVIEW OF SYSTEMS:  CONSTITUTIONAL: Positive for low-grade fever, positive for fatigue.  EYES: No blurred or double vision.  Wears glasses. EARS, NOSE, AND THROAT: No tinnitus or ear pain. Positive for sore throat and runny nose. RESPIRATORY: Positive for cough, shortness of breath, and wheezing. No hemoptysis.  CARDIOVASCULAR: No chest pain, patient always has edema.  GASTROINTESTINAL: Positive for nausea, no vomiting, diarrhea or abdominal pain. No blood in bowel movements GENITOURINARY: No dysuria, hematuria.  ENDOCRINE: No polyuria, nocturia,  HEMATOLOGY: History of anemia, no easy bruising or bleeding SKIN: No rash. MUSCULOSKELETAL: No joint pain or arthritis.   NEUROLOGIC: No tingling, numbness, weakness.  PSYCHIATRY: No anxiety or depression.   MEDICATIONS AT HOME:   Prior to Admission medications   Medication Sig Start Date End Date Taking? Authorizing Provider  amLODipine (NORVASC) 5 MG tablet Take 5 mg by mouth daily.    Historical Provider, MD   atorvastatin (LIPITOR) 20 MG tablet Take 20 mg by mouth at bedtime.    Historical Provider, MD  clonazePAM (KLONOPIN) 1 MG tablet Take 1 mg by mouth 3 (three) times daily.    Historical Provider, MD  clonazePAM (KLONOPIN) 1 MG tablet Take 1 tablet (1 mg total) by mouth 3 (three) times daily. 09/09/15   Henreitta Leber, MD  conjugated estrogens (PREMARIN) vaginal cream Place 1 Applicatorful vaginally 3 (three) times a week. Pt uses on Monday, Wednesday, and Friday.    Historical Provider, MD  desmopressin (DDAVP) 0.2 MG tablet Take 0.2 mg by mouth at bedtime.    Historical Provider, MD  docusate sodium (COLACE) 100 MG capsule Take 200 mg by mouth 2 (two) times daily.    Historical Provider, MD  fexofenadine (ALLEGRA) 180 MG tablet Take 180 mg by mouth daily.    Historical Provider, MD  furosemide (LASIX) 40 MG tablet Take 1 tablet (40 mg total) by mouth daily. 09/09/15   Henreitta Leber, MD  gabapentin (NEURONTIN) 300 MG capsule Take 300-600 mg by mouth 3 (three) times daily. Pt takes two capsules in the morning, one capsule at lunch, and two capsules in the evening.    Historical Provider, MD  hydrOXYzine (ATARAX/VISTARIL) 25 MG tablet Take 25 mg by mouth at bedtime.     Historical Provider, MD  insulin glargine (LANTUS) 100 UNIT/ML injection Inject 50 Units into the skin at bedtime.    Historical Provider, MD  iron polysaccharides (NIFEREX) 150 MG capsule Take 150 mg by mouth daily.    Historical Provider, MD  losartan (COZAAR) 100 MG tablet Take 100 mg by mouth daily.    Historical Provider, MD  metFORMIN (GLUCOPHAGE) 1000 MG tablet Take 1,000 mg by mouth 2 (two) times daily with a meal.    Historical Provider, MD  metoprolol succinate (TOPROL-XL) 100 MG 24 hr tablet Take 100 mg by mouth 2 (two) times daily.     Historical Provider, MD  mirabegron ER (MYRBETRIQ) 50 MG TB24 tablet Take 50 mg by mouth every evening.    Historical Provider, MD  omeprazole (PRILOSEC) 40 MG capsule Take 40 mg by mouth  2 (two) times daily.    Historical Provider, MD  pioglitazone (ACTOS) 30 MG tablet Take 30 mg by mouth daily.    Historical Provider, MD  raloxifene (EVISTA) 60 MG tablet Take 60 mg by mouth at bedtime.    Historical Provider, MD  VESICARE 10 MG tablet TAKE 1 TABLET BY MOUTH EVERY DAY 09/24/15   Nori Riis, PA-C  vitamin E 1000 UNIT capsule Take 1,000 Units by mouth daily.    Historical Provider, MD    Medication reconciliation process still undergoing  at this time.  VITAL SIGNS:  Blood pressure 164/67, pulse 81, temperature 99.3 F (37.4 C), temperature source Oral, resp. rate 22, height 5\' 7"  (1.702 m), weight 143.7 kg (316 lb 12.8 oz), SpO2 97 %.  PHYSICAL EXAMINATION:  GENERAL:  74 y.o.-year-old patient lying in the bed with no acute distress.  EYES: Pupils equal, round, reactive to light and accommodation. No scleral icterus. Extraocular muscles intact.  HEENT: Head atraumatic, normocephalic. Oropharynx and nasopharynx clear.  NECK:  Supple, no jugular venous distention. No thyroid enlargement, no tenderness.  LUNGS: Decreased breath sounds bilaterally, upper airway wheezing, positive rales in lower lung fields. No use of accessory muscles of respiration.  CARDIOVASCULAR: S1, S2 normal. No murmurs, rubs, or gallops.  ABDOMEN: Soft, nontender, nondistended. Bowel sounds present. No organomegaly or mass.  EXTREMITIES: 3+ edema, no cyanosis, or clubbing.  NEUROLOGIC: Cranial nerves II through XII are intact. Muscle strength 5/5 in all extremities. Sensation intact. Gait not checked.  PSYCHIATRIC: The patient is alert and oriented x 3.  SKIN: Chronic lower extremity discoloration  LABORATORY PANEL:   CBC  Recent Labs Lab 09/30/15 1231  WBC 4.6  HGB 10.5*  HCT 32.0*  PLT 148*   ------------------------------------------------------------------------------------------------------------------  Chemistries   Recent Labs Lab 09/30/15 1231  NA 137  K 4.3  CL 99*  CO2  28  GLUCOSE 208*  BUN 25*  CREATININE 1.02*  CALCIUM 9.4   ------------------------------------------------------------------------------------------------------------------  Cardiac Enzymes  Recent Labs Lab 09/30/15 1231  TROPONINI <0.03   ------------------------------------------------------------------------------------------------------------------  RADIOLOGY:  Dg Chest 2 View  09/30/2015  CLINICAL DATA:  Weakness and cough for 1 week, recent admission for CHF, increased fatigue, 88% oxygenation on room air EXAM: CHEST  2 VIEW COMPARISON:  09/08/2015 FINDINGS: Enlargement of cardiac silhouette with pulmonary vascular congestion. Atherosclerotic calcification aorta. Minimal persistent accentuation of perihilar interstitial markings. Findings reflect chronic failure without acute superimposed edema or consolidation. No pleural effusion or pneumothorax. Bones demineralized. IMPRESSION: Minimal chronic failure. No new abnormalities. Electronically Signed   By: Lavonia Dana M.D.   On: 09/30/2015 13:19    EKG:   Normal sinus rhythm 82 bpm  IMPRESSION AND PLAN:   1. Acute respiratory failure with a pulse ox of 88% on room air. Oxygen supplementation given. 2. Upper respiratory tract infection versus acute diastolic congestive heart failure. Lasix 60 mg given in the ER IV. I will continue Lasix 20 mg IV twice a day starting tomorrow. Patient on metoprolol and Toprol. I will do an influenza swab. Since symptoms have been going on greater than a week not sure if Tamiflu would have any benefit at this point. Levaquin given in the emergency room for upper respiratory tract infection. Chest x-ray read as no pneumonia. I will give nebulizer treatments. 3. Type 2 diabetes mellitus- continue sliding scale and Lantus. Stop Actos 4. Essential hypertension- blood pressure slightly high now will monitor on usual medications 5. Hyperlipidemia unspecified continue atorvastatin 6. Anxiety on  clonazepam 7. History of anemia.    All the records are reviewed and case discussed with ED provider. Management plans discussed with the patient, family and they are in agreement.  CODE STATUS: Full code  TOTAL TIME TAKING CARE OF THIS PATIENT: 55 minutes.    Loletha Grayer M.D on 09/30/2015 at 2:39 PM  Between 7am to 6pm - Pager - [phone]  After 6pm call admission pager [facility]  Office  [phone]  CC: Primary care physician; Adrian Prows, MD

## 2015-09-30 NOTE — ED Notes (Signed)
Per EMS report, patient c/o weakness and cough for one week. Patient was admitted two weeks ago for CHF  Per report, was discharged to [facility] for one week and has been home for one week. Patient reports increased fatigue, weakness and a cough. Patient states she took Augmentin PO per PMD without relief. Patient is unsure if she is short of breath. Patient was 88% on RA upon EMS arrival and was 94% on 2L. Patient has +4 BLE edema and facial edema.

## 2015-09-30 NOTE — ED Notes (Signed)
Patient placed on bedpan.

## 2015-09-30 NOTE — Progress Notes (Signed)
Patient's HR 56. Notified MD and given orders to modify metoprolol dosage to 50mg  BID. Nursing staff will continue to monitor. Earleen Reaper, RN

## 2015-10-01 LAB — GLUCOSE, CAPILLARY
GLUCOSE-CAPILLARY: 75 mg/dL (ref 65–99)
GLUCOSE-CAPILLARY: 140 mg/dL — AB (ref 65–99)
GLUCOSE-CAPILLARY: 187 mg/dL — AB (ref 65–99)
GLUCOSE-CAPILLARY: 203 mg/dL — AB (ref 65–99)
Glucose-Capillary: 160 mg/dL — ABNORMAL HIGH (ref 65–99)
Glucose-Capillary: 79 mg/dL (ref 65–99)
Glucose-Capillary: 73 mg/dL (ref 65–99)
Glucose-Capillary: 199 mg/dL — ABNORMAL HIGH (ref 65–99)

## 2015-10-01 LAB — CBC
HCT: 30.2 % — ABNORMAL LOW (ref 35.0–47.0)
HEMOGLOBIN: 10.3 g/dL — AB (ref 12.0–16.0)
MCH: 29.6 pg (ref 26.0–34.0)
MCHC: 34 g/dL (ref 32.0–36.0)
MCV: 87 fL (ref 80.0–100.0)
PLATELETS: 130 10*3/uL — AB (ref 150–440)
RBC: 3.48 MIL/uL — ABNORMAL LOW (ref 3.80–5.20)
RDW: 14.8 % — ABNORMAL HIGH (ref 11.5–14.5)
WBC: 4.8 10*3/uL (ref 3.6–11.0)

## 2015-10-01 LAB — BASIC METABOLIC PANEL
Anion gap: 6 (ref 5–15)
BUN: 24 mg/dL — ABNORMAL HIGH (ref 6–20)
CALCIUM: 9.3 mg/dL (ref 8.9–10.3)
CO2: 32 mmol/L (ref 22–32)
CREATININE: 1.1 mg/dL — AB (ref 0.44–1.00)
Chloride: 100 mmol/L — ABNORMAL LOW (ref 101–111)
GFR calc Af Amer: 57 mL/min — ABNORMAL LOW (ref >60)
GFR calc non Af Amer: 49 mL/min — ABNORMAL LOW (ref >60)
GLUCOSE: 81 mg/dL (ref 65–99)
Potassium: 4.2 mmol/L (ref 3.5–5.1)
Sodium: 138 mmol/L (ref 135–145)

## 2015-10-01 MED ORDER — ENOXAPARIN SODIUM 40 MG/0.4ML ~~LOC~~ SOLN
40.0000 mg | Freq: Two times a day (BID) | SUBCUTANEOUS | Status: DC
  Administered 2015-10-01 – 2015-10-07 (×12): 40 mg via SUBCUTANEOUS
  Filled 2015-10-01 (×12): qty 0.4

## 2015-10-01 MED ORDER — HYDROCODONE-ACETAMINOPHEN 5-325 MG PO TABS
1.0000 | ORAL_TABLET | Freq: Four times a day (QID) | ORAL | Status: DC | PRN
  Administered 2015-10-01 – 2015-10-07 (×16): 1 via ORAL
  Filled 2015-10-01 (×18): qty 1

## 2015-10-01 NOTE — Progress Notes (Signed)
Alert and oriented. Sinus rhythm on tele. Only complaint has been shoulder pain, now feeling better from vicodin. Patient has voided numerous times today after receiving both doses of lasix. Does have some crackles in her lung bases. Will continue to monitor.

## 2015-10-01 NOTE — Progress Notes (Signed)
[place] at [place] NAME: Carolyn Valdez    MR#:  [id number]  DATE OF BIRTH:  [birth year]  SUBJECTIVE:  CHIEF COMPLAINT:   Chief Complaint  Patient presents with  . Fatigue   still cough and shortness of breath, oxygen dependent cannula 2 L.  REVIEW OF SYSTEMS:  CONSTITUTIONAL: No fever, has generalized weakness.  EYES: No blurred or double vision.  EARS, NOSE, AND THROAT: No tinnitus or ear pain.  RESPIRATORY: Has cough and shortness of breath, no wheezing or hemoptysis.  CARDIOVASCULAR: No chest pain, orthopnea, edema.  GASTROINTESTINAL: No nausea, vomiting, diarrhea or abdominal pain.  GENITOURINARY: No dysuria, hematuria.  ENDOCRINE: No polyuria, nocturia,  HEMATOLOGY: No anemia, easy bruising or bleeding SKIN: No rash or lesion. MUSCULOSKELETAL: No joint pain or arthritis.   NEUROLOGIC: No tingling, numbness, weakness.  PSYCHIATRY: No anxiety or depression.   DRUG ALLERGIES:   Allergies  Allergen Reactions  . Sulfa Antibiotics Hives  . Biaxin [Clarithromycin] Hives  . Eggs Or Egg-Derived Products Other (See Comments)    Pt states that her allergy test said she was allergic to eggs.    . Influenza A (H1n1) Monoval Vac Other (See Comments)    Pt states that she was told by her MD not to get the influenza vaccine.    . Morphine Other (See Comments)    Reaction:  Dizziness and confusion   . Pyridium [Phenazopyridine Hcl] Other (See Comments)    Reaction:  Unknown   . Ceftriaxone Anxiety  . Latex Rash  . Prednisone Rash  . Tape Rash    VITALS:  Blood pressure 133/47, pulse 75, temperature 97.5 F (36.4 C), temperature source Oral, resp. rate 17, height 5\' 7"  (1.702 m), weight 139.708 kg (308 lb), SpO2 96 %.  PHYSICAL EXAMINATION:  GENERAL:  74 y.o.-year-old patient lying in the bed with no acute distress.  EYES: Pupils equal, round, reactive to light and accommodation. No scleral icterus. Extraocular muscles intact.   HEENT: Head atraumatic, normocephalic. Oropharynx and nasopharynx clear.  NECK:  Supple, no jugular venous distention. No thyroid enlargement, no tenderness.  LUNGS: Normal breath sounds bilaterally, no wheezing, bibasilar rales. No use of accessory muscles of respiration.  CARDIOVASCULAR: S1, S2 normal. No murmurs, rubs, or gallops.  ABDOMEN: Soft, nontender, nondistended. Bowel sounds present. No organomegaly or mass.  EXTREMITIES: No pedal edema, cyanosis, or clubbing.  NEUROLOGIC: Cranial nerves II through XII are intact. Muscle strength 4/5 in all extremities. Sensation intact. Gait not checked.  PSYCHIATRIC: The patient is alert and oriented x 3.  SKIN: No obvious rash, lesion, or ulcer.    LABORATORY PANEL:   CBC  Recent Labs Lab 10/01/15 0541  WBC 4.8  HGB 10.3*  HCT 30.2*  PLT 130*   ------------------------------------------------------------------------------------------------------------------  Chemistries   Recent Labs Lab 10/01/15 0541  NA 138  K 4.2  CL 100*  CO2 32  GLUCOSE 81  BUN 24*  CREATININE 1.10*  CALCIUM 9.3   ------------------------------------------------------------------------------------------------------------------  Cardiac Enzymes  Recent Labs Lab 09/30/15 2055  TROPONINI <0.03   ------------------------------------------------------------------------------------------------------------------  RADIOLOGY:  Dg Chest 2 View  09/30/2015  CLINICAL DATA:  Weakness and cough for 1 week, recent admission for CHF, increased fatigue, 88% oxygenation on room air EXAM: CHEST  2 VIEW COMPARISON:  09/08/2015 FINDINGS: Enlargement of cardiac silhouette with pulmonary vascular congestion. Atherosclerotic calcification aorta. Minimal persistent accentuation of perihilar interstitial markings. Findings reflect chronic failure without acute superimposed edema or consolidation. No pleural  effusion or pneumothorax. Bones demineralized. IMPRESSION:  Minimal chronic failure. No new abnormalities. Electronically Signed   By: Lavonia Dana M.D.   On: 09/30/2015 13:19    EKG:   Orders placed or performed during the hospital encounter of 09/30/15  . ED EKG  . ED EKG    ASSESSMENT AND PLAN:   1. Acute respiratory failure with a pulse ox of 88% on room air.  Continue oxygen by nasal cannula and nebulizer.  2. Acute on chronic diastolic congestive heart failure.  continue Lasix 20 mg IV twice a day, metoprolol and Toprol, continue ebulizer treatments. 3. Type 2 diabetes mellitus- continue sliding scale and Lantus. Stop Actos 4. Essential hypertension- continue Lopressor and Lasix. 5. Hyperlipidemia unspecified, continue atorvastatin 6. Anxiety on clonazepam 7. History of anemia. Stable. Thrombocytopenia, platelet date decreased to 130. Follow-up CBC.  Per PT evaluation, the patient need home health and PT.   All the records are reviewed and case discussed with Care Management/Social Workerr. Management plans discussed with the patient, family and they are in agreement.  CODE STATUS: Full code  TOTAL TIME TAKING CARE OF THIS PATIENT: 37 minutes.  Greater than 50% time was spent on coordination of care and face-to-face counseling.  POSSIBLE D/C IN 2 DAYS, DEPENDING ON CLINICAL CONDITION.   Demetrios Loll M.D on 10/01/2015 at 3:23 PM  Between 7am to 6pm - Pager - [phone]  After 6pm go to [email or web address] - password [email or web address] [facility]  [facility]  Office  [phone]  CC: Primary care physician; Adrian Prows, MD

## 2015-10-01 NOTE — Evaluation (Signed)
Physical Therapy Evaluation Patient Details Name: Carolyn Valdez MRN: [id number] DOB: 09-13-1941 Today's Date: 10/01/2015   History of Present Illness  Pt is a 74 y.o. female presenting to hospital with SOB and cough about 1 week (pt recently returned home from [facility]).  Pt admitted with upper respiratory tract infection vs acute diastolic CHF.  PMH includes morbid obesity, diabetes, R TKA, heart failure.  Clinical Impression  Prior to admission, pt was modified independent using RW for functional mobility.  Pt lives at home alone with 2 steps to enter with railing.  Currently pt is CGA to SBA with transfers and ambulation with bariatric RW 125 feet.  Pt would benefit from skilled PT to address noted impairments and functional limitations.  Recommend pt discharge to home with HHPT when medically appropriate.     Follow Up Recommendations Home health PT    Equipment Recommendations       Recommendations for Other Services       Precautions / Restrictions Precautions Precautions: Fall Restrictions Weight Bearing Restrictions: No      Mobility  Bed Mobility Overal bed mobility: Modified Independent Bed Mobility: Sit to Supine     Supine to sit: Modified independent (Device/Increase time);HOB elevated     General bed mobility comments: increased effort but able to perform on her own  Transfers Overall transfer level: Needs assistance Equipment used: Rolling walker (2 wheeled) (Bariatric) Transfers: Sit to/from [employer] Transfers Sit to Stand: Supervision;Min guard Stand pivot transfers: Supervision;Min guard (toilet transfer)       General transfer comment: increased effort and time to stand but steady without loss of balance  Ambulation/Gait Ambulation/Gait assistance: Supervision;Min guard Ambulation Distance (Feet): 125 Feet (also 15 feet x2 no AD to bathroom and back CGA with UE support intermittently on objects--pt refused RW to ambulate to/from  bathroom) Assistive device: Rolling walker (2 wheeled) (Bariatric) Gait Pattern/deviations: Step-through pattern;Wide base of support Gait velocity: slower cadence   General Gait Details: steady without loss of balance  Stairs            Wheelchair Mobility    Modified Rankin (Stroke Patients Only)       Balance Overall balance assessment: Needs assistance Sitting-balance support: No upper extremity supported;Feet supported Sitting balance-Leahy Scale: Normal     Standing balance support: Bilateral upper extremity supported (on RW) Standing balance-Leahy Scale: Good                               Pertinent Vitals/Pain Pain Assessment: 0-10 Pain Score: 10-Worst pain ever Pain Location: R shoulder pain Pain Descriptors / Indicators: Sore;Tender (for past 3 weeks (pt reports injurying it at [facility])) Pain Intervention(s): Limited activity within patient's tolerance;Monitored during session;Repositioned;Patient requesting pain meds-RN notified    Home Living Family/patient expects to be discharged to:: Private residence Living Arrangements: Alone   Type of Home:  (Condo) Home Access: Stairs to enter Entrance Stairs-Rails: Right Entrance Stairs-Number of Steps: 1 plus 1 Home Layout: One level Home Equipment: Walker - 2 wheels;Cane - single point      Prior Function Level of Independence: Independent with assistive device(s)         Comments: Pt uses RW for ambulation; no home O2     Hand Dominance        Extremity/Trunk Assessment   Upper Extremity Assessment: Overall WFL for tasks assessed (except deferred R shoulder AROM d/t c/o pain)  Lower Extremity Assessment: Overall WFL for tasks assessed         Communication   Communication: No difficulties  Cognition Arousal/Alertness: Awake/alert Behavior During Therapy: WFL for tasks assessed/performed Overall Cognitive Status: Within Functional Limits for tasks  assessed                      General Comments   Nursing cleared pt for participation in physical therapy.  Pt agreeable to PT session.    Exercises        Assessment/Plan    PT Assessment Patient needs continued PT services  PT Diagnosis Difficulty walking   PT Problem List Decreased activity tolerance;Decreased mobility  PT Treatment Interventions DME instruction;Gait training;Stair training;Functional mobility training;Therapeutic activities;Therapeutic exercise;Balance training;Patient/family education   PT Goals (Current goals can be found in the Care Plan section) Acute Rehab PT Goals Patient Stated Goal: to improve activity tolerance PT Goal Formulation: With patient Time For Goal Achievement: 10/15/15 Potential to Achieve Goals: Good    Frequency Min 2X/week   Barriers to discharge        Co-evaluation               End of Session Equipment Utilized During Treatment: Gait belt;Oxygen Activity Tolerance: Patient tolerated treatment well Patient left: in bed;with call bell/phone within reach;with bed alarm set;with nursing/sitter in room Nurse Communication: Mobility status (O2 vitals)         Time: [id number] PT Time Calculation (min) (ACUTE ONLY): 40 min   Charges:   PT Evaluation $Initial PT Evaluation Tier I: 1 Procedure PT Treatments $Therapeutic Activity: 8-22 mins   PT G CodesLeitha Bleak 10-07-2015, 10:01 AM Leitha Bleak, PT [phone]

## 2015-10-01 NOTE — Care Management Obs Status (Signed)
[place] NOTIFICATION   Patient Details  Name: Carolyn Valdez MRN: [id number] Date of Birth: 1941/06/08   Medicare Observation Status Notification Given:  Yes  CODE 30  Medicare uhc requested observation.  Patient met inpatient criteria under Interqual criteria.    Katrina Stack, RN 10/01/2015, 4:58 PM

## 2015-10-01 NOTE — Progress Notes (Signed)
Received call from Elmo Putt at [facility] notifying me that [facility] has denied inpatient status and will pay at OBS level. I called Dr. Bridgett Larsson and notified him of the denial. I asked if he would like to change the patient to OBS and he stated no, so I asked him to do a peer to peer. He said he did not want to do a peer to peer. I then called Dr. Doy Hutching and we discussed the case. He felt the patient was appropriate for OBS, I called Dr. Bridgett Larsson and notified him of the conversation I had with Dr Doy Hutching. He then told me to change the patient to Observation.

## 2015-10-01 NOTE — Progress Notes (Signed)
Per CNA, CBG this AM was 73. Patient is feeling sweaty and can tell her sugar is low, but is stable at this point. Given orange juice and will recheck.

## 2015-10-01 NOTE — Progress Notes (Signed)
Patient ordered enoxaparin 40 mg subcutaneously daily for DVT prophylaxis.  Changed order to enoxaparin 40 mg subcutaneously BID per anticoagulation protocol based on BMI >40.  Darylene Price Garry Bochicchio 2:03 PM

## 2015-10-01 NOTE — Progress Notes (Signed)
Patient rested quietly tonight. She did complain of some right shoulder pain, which was relieved with the use of a warm pack and Tylenol. Patient was able to walk to the bathroom twice with no problems. Nursing staff will continue to monitor. Earleen Reaper, RN

## 2015-10-01 NOTE — Progress Notes (Signed)
[facility]  Patient Status: active  [facility] is providing the following services: PT/OT, SW never was able to make visit before going back to [facility], Dr. Renata Caprice updated via fax.  If patient discharges after hours, please call [phone].   Carolyn Valdez 10/01/2015, 10:02 AM

## 2015-10-01 NOTE — Progress Notes (Signed)
Patient not requesting something other than tylenol for her shoulder pain. Patient states she injured her shoulder doing physical therapy before coming into the hospital and it has been bothering her ever since. States she has taken vicodin in the past and would like to try that again. Orders received from Dr. Bridgett Larsson.

## 2015-10-02 LAB — GLUCOSE, CAPILLARY
GLUCOSE-CAPILLARY: 192 mg/dL — AB (ref 65–99)
GLUCOSE-CAPILLARY: 185 mg/dL — AB (ref 65–99)

## 2015-10-02 MED ORDER — BUDESONIDE 0.25 MG/2ML IN SUSP
0.2500 mg | Freq: Two times a day (BID) | RESPIRATORY_TRACT | Status: DC
  Administered 2015-10-02 – 2015-10-07 (×9): 0.25 mg via RESPIRATORY_TRACT
  Filled 2015-10-02 (×9): qty 2

## 2015-10-02 MED ORDER — FUROSEMIDE 40 MG PO TABS
40.0000 mg | ORAL_TABLET | Freq: Every day | ORAL | Status: DC
  Administered 2015-10-03: 40 mg via ORAL
  Filled 2015-10-02: qty 1

## 2015-10-02 NOTE — Care Management (Signed)
Unable to locate documentation of Ambulatory 02 sats.  Stressed the importance of obtaining this assessment with primary nurse.

## 2015-10-02 NOTE — Progress Notes (Signed)
Initial appointment made at the [facility] on October 22, 2015 at 1:00pm. Thank you.

## 2015-10-02 NOTE — Progress Notes (Signed)
Patient rested quietly throughout the night. Multiple complaints of pain acknowledged and treated with PRN medications. Incontinent at times due to urinary frequency caused by lasix. Nursing staff will continue to monitor. Earleen Reaper, RN

## 2015-10-02 NOTE — Progress Notes (Signed)
Inpatient Diabetes Program Recommendations  AACE/ADA: New Consensus Statement on Inpatient Glycemic Control (2015)  Target Ranges:  Prepandial:   less than 140 mg/dL      Peak postprandial:   less than 180 mg/dL (1-2 hours)      Critically ill patients:  140 - 180 mg/dL   Review of Glycemic Control  Diabetes history: Type 2 Outpatient Diabetes medications: Lantus 50 units qhs, Actos 34m qday, Metformin 10043mqday Current orders for Inpatient glycemic control: Lantus 50 units qhs, Novolog 0-15 units tid with meals, Novolog 0-5 units qhs, Metformin 100061mid.   Inpatient Diabetes Program Recommendations: Agree with current orders for Lantus and Novolog.   I met with the patient and discussed the need for mealtime insulin ([facility]  As a result of not getting both yesterday's suppertime insulin and the hs insulin last night, today's fasting blood sugars are elevated. - it is imperative that this patient be encouraged to take her correction insulin.  (She may have had a low blood sugar on the morning of December 14th because Actos was still on board and the patient ate poorly.)  JulGentry FitzN, BA,[place] Diabetes Coordinator Inpatient Diabetes Program  [phone] Pager) [phone]/15/2016 12:34 PM

## 2015-10-02 NOTE — Progress Notes (Signed)
SATURATION QUALIFICATIONS: (This note is used to comply with regulatory documentation for home oxygen)  Patient Saturations on Room Air at Rest = 87%  Patient Saturations on Room Air while Ambulating = na%  Patient Saturations on 2 Liters of oxygen at rest = 96%  Please briefly explain why patient needs home oxygen: 

## 2015-10-02 NOTE — Progress Notes (Signed)
Patient ID: Carolyn Valdez, female   DOB: 03-11-1941, 74 y.o.   MRN: [id number] [facility] PROGRESS NOTE  PCP: Carolyn Prows, MD  HPI/Subjective: Patient states that she urinated 50 times last night. She also has bladder issues. She still short of breath and cough.  Objective: Filed Vitals:   10/02/15 1224 10/02/15 1238  BP: 138/47   Pulse:  81  Temp:  98.1 F (36.7 C)  Resp:  18    Filed Weights   09/30/15 1221 09/30/15 1621  Weight: 143.7 kg (316 lb 12.8 oz) 139.708 kg (308 lb)    ROS: Review of Systems  Constitutional: Negative for fever and chills.  Eyes: Negative for blurred vision.  Respiratory: Positive for cough, shortness of breath and wheezing.   Cardiovascular: Negative for chest pain.  Gastrointestinal: Negative for nausea, vomiting, abdominal pain, diarrhea and constipation.  Genitourinary: Negative for dysuria.  Musculoskeletal: Negative for joint pain.  Neurological: Negative for dizziness and headaches.   Exam: Physical Exam  Constitutional: She is oriented to person, place, and time.  HENT:  Nose: No mucosal edema.  Mouth/Throat: No oropharyngeal exudate or posterior oropharyngeal edema.  Eyes: Conjunctivae, EOM and lids are normal. Pupils are equal, round, and reactive to light.  Neck: No JVD present. Carotid bruit is not present. No edema present. No thyroid mass and no thyromegaly present.  Cardiovascular: S1 normal and S2 normal.  Exam reveals no gallop.   No murmur heard. Pulses:      Dorsalis pedis pulses are 2+ on the right side, and 2+ on the left side.  Respiratory: No respiratory distress. She has decreased breath sounds in the right upper field, the right middle field, the right lower field, the left upper field, the left middle field and the left lower field. She has wheezes in the right upper field and the left upper field. She has no rhonchi. She has rales in the right lower field and the left lower field.  GI: Soft. Bowel  sounds are normal. There is no tenderness.  Musculoskeletal:       Right ankle: She exhibits swelling.       Left ankle: She exhibits swelling.  Lymphadenopathy:    She has no cervical adenopathy.  Neurological: She is alert and oriented to person, place, and time. No cranial nerve deficit.  Skin: Skin is warm. No rash noted. Nails show no clubbing.  Psychiatric: She has a normal mood and affect.    Data Reviewed: Basic Metabolic Panel:  Recent Labs Lab 09/30/15 1231 10/01/15 0541  NA 137 138  K 4.3 4.2  CL 99* 100*  CO2 28 32  GLUCOSE 208* 81  BUN 25* 24*  CREATININE 1.02* 1.10*  CALCIUM 9.4 9.3   CBC:  Recent Labs Lab 09/30/15 1231 10/01/15 0541  WBC 4.6 4.8  HGB 10.5* 10.3*  HCT 32.0* 30.2*  MCV 87.5 87.0  PLT 148* 130*   CBG:  Recent Labs Lab 10/01/15 0817 10/01/15 1132 10/01/15 1609 10/01/15 2116 10/02/15 0713  GLUCAP 140* 199* 187* 203* 192*    Scheduled Meds: . amLODipine  5 mg Oral Daily  . atorvastatin  20 mg Oral QHS  . budesonide (PULMICORT) nebulizer solution  0.25 mg Nebulization BID  . clonazePAM  1 mg Oral TID  . conjugated estrogens  1 Applicatorful Vaginal Once per day on Mon Wed Fri  . desmopressin  0.2 mg Oral QHS  . docusate sodium  200 mg Oral BID  . enoxaparin (LOVENOX) injection  40 mg Subcutaneous Q12H  . [START ON 10/03/2015] furosemide  40 mg Oral Daily  . gabapentin  300 mg Oral Q1200  . gabapentin  600 mg Oral q morning - 10a  . gabapentin  600 mg Oral QPM  . hydrOXYzine  25 mg Oral QHS  . insulin aspart  0-15 Units Subcutaneous TID WC  . insulin aspart  0-5 Units Subcutaneous QHS  . insulin glargine  50 Units Subcutaneous QHS  . ipratropium-albuterol  3 mL Nebulization Q6H  . iron polysaccharides  150 mg Oral Daily  . levofloxacin  500 mg Oral Daily  . loratadine  10 mg Oral Daily  . losartan  100 mg Oral Daily  . metFORMIN  1,000 mg Oral BID WC  . metoprolol succinate  50 mg Oral BID  . mirabegron ER  50 mg Oral  QPM  . pantoprazole  40 mg Oral QAC breakfast  . raloxifene  60 mg Oral QHS  . sodium chloride  3 mL Intravenous Q12H  . vitamin E  1,000 Units Oral Daily    Assessment/Plan:  1. Acute respiratory failure with hypoxia. This was also present on admission when I admitted the patient as an admission! Patient was changed back to inpatient status. Continue oxygen supplementation. 2. Acute on chronic diastolic congestive heart failure- patient states that she was urinating so much last night that she wants to go back to once a day dosing on her Lasix. Continue metoprolol. 3. Upper respiratory tract infection- start budesonide nebulizers and continue Levaquin. Continue DuoNeb 4. Essential hypertension continue her usual medications 5. Type 2 diabetes mellitus- stop Actos. Continue metformin, Lantus and sliding scale. 6. Gastroesophageal reflux disease without esophagitis continue Protonix 7. Hyperlipidemia unspecified continue atorvastatin 8. History of breast cancer 9. Morbid obesity 10. Anemia history on iron  Code Status:     Code Status Orders        Start     Ordered   09/30/15 1423  Full code   Continuous     09/30/15 1423    Advance Directive Documentation        Most Recent Value   Type of Advance Directive  Living will   Pre-existing out of facility DNR order (yellow form or pink MOST form)     "MOST" Form in Place?       Disposition Plan: We'll try to get off oxygen prior to disposition. Patient must move better air prior to discharge home  Antibiotics:  Levaquin  Time spent: 25 minutes  Loletha Grayer  [facility]

## 2015-10-03 LAB — GLUCOSE, CAPILLARY
GLUCOSE-CAPILLARY: 186 mg/dL — AB (ref 65–99)
GLUCOSE-CAPILLARY: 147 mg/dL — AB (ref 65–99)
Glucose-Capillary: 213 mg/dL — ABNORMAL HIGH (ref 65–99)
Glucose-Capillary: 172 mg/dL — ABNORMAL HIGH (ref 65–99)
Glucose-Capillary: 203 mg/dL — ABNORMAL HIGH (ref 65–99)

## 2015-10-03 MED ORDER — POLYETHYLENE GLYCOL 3350 17 G PO PACK: 17.0000 g | PACK | Freq: Every day | ORAL | Status: DC | PRN

## 2015-10-03 MED ORDER — FUROSEMIDE 40 MG PO TABS
40.0000 mg | ORAL_TABLET | Freq: Two times a day (BID) | ORAL | Status: DC
  Administered 2015-10-03 – 2015-10-05 (×4): 40 mg via ORAL
  Filled 2015-10-03 (×4): qty 1

## 2015-10-03 MED ORDER — ONDANSETRON HCL 4 MG/2ML IJ SOLN
4.0000 mg | Freq: Four times a day (QID) | INTRAMUSCULAR | Status: DC | PRN
Start: 2015-10-03 — End: 2015-10-07

## 2015-10-03 MED ORDER — IPRATROPIUM-ALBUTEROL 0.5-2.5 (3) MG/3ML IN SOLN
3.0000 mL | Freq: Three times a day (TID) | RESPIRATORY_TRACT | Status: DC
  Administered 2015-10-03 – 2015-10-05 (×7): 3 mL via RESPIRATORY_TRACT
  Filled 2015-10-03 (×7): qty 3

## 2015-10-03 NOTE — Care Management Important Message (Signed)
Important Message  Patient Details  Name: Carolyn Valdez MRN: [id number] Date of Birth: August 23, 1941   Medicare Important Message Given:  Yes    [name] A [name] 10/03/2015, 11:07 AM

## 2015-10-03 NOTE — Progress Notes (Signed)
Began taking care of patient at 0100. Patient is alert and oriented, no c/o pain at this time. Continues on 2 L [place] with normal oxygen saturation.

## 2015-10-03 NOTE — Care Management (Signed)
Obtained order for home health SN PT OT Aide and social work.  Patient has qualified for home 02.  Obtained order in anticipate of discharge home over the weekend

## 2015-10-03 NOTE — Progress Notes (Addendum)
Patient ID: Carolyn Valdez, female   DOB: 1940-11-28, 74 y.o.   MRN: [id number]  [facility] PROGRESS NOTE  PCP: Adrian Prows, MD  HPI/Subjective: Patient still with shortness of breath and cough and wheeze. Not feeling well. She wants to be better before getting out of the hospital.  Objective: Filed Vitals:   10/03/15 0801 10/03/15 1207  BP: 150/60 149/44  Pulse: 71 71  Temp: 98.4 F (36.9 C)   Resp:  18    Filed Weights   09/30/15 1221 09/30/15 1621  Weight: 143.7 kg (316 lb 12.8 oz) 139.708 kg (308 lb)    ROS: Review of Systems  Constitutional: Negative for fever and chills.  Eyes: Negative for blurred vision.  Respiratory: Positive for cough, shortness of breath and wheezing.   Cardiovascular: Negative for chest pain.  Gastrointestinal: Positive for nausea. Negative for vomiting, abdominal pain, diarrhea and constipation.  Genitourinary: Negative for dysuria.  Musculoskeletal: Negative for joint pain.  Neurological: Negative for dizziness and headaches.   Exam: Physical Exam  Constitutional: She is oriented to person, place, and time.  HENT:  Nose: No mucosal edema.  Mouth/Throat: No oropharyngeal exudate or posterior oropharyngeal edema.  Eyes: Conjunctivae, EOM and lids are normal. Pupils are equal, round, and reactive to light.  Neck: No JVD present. Carotid bruit is not present. No edema present. No thyroid mass and no thyromegaly present.  Cardiovascular: S1 normal and S2 normal.  Exam reveals no gallop.   No murmur heard. Pulses:      Dorsalis pedis pulses are 2+ on the right side, and 2+ on the left side.  Respiratory: No respiratory distress. She has decreased breath sounds in the right upper field, the right middle field, the right lower field, the left upper field, the left middle field and the left lower field. She has wheezes in the right upper field and the left upper field. She has no rhonchi. She has rales in the right lower field and  the left lower field.  GI: Soft. Bowel sounds are normal. There is no tenderness.  Musculoskeletal:       Right ankle: She exhibits swelling.       Left ankle: She exhibits swelling.  Lymphadenopathy:    She has no cervical adenopathy.  Neurological: She is alert and oriented to person, place, and time. No cranial nerve deficit.  Skin: Skin is warm. No rash noted. Nails show no clubbing.  Psychiatric: She has a normal mood and affect.    Data Reviewed: Basic Metabolic Panel:  Recent Labs Lab 09/30/15 1231 10/01/15 0541  NA 137 138  K 4.3 4.2  CL 99* 100*  CO2 28 32  GLUCOSE 208* 81  BUN 25* 24*  CREATININE 1.02* 1.10*  CALCIUM 9.4 9.3   CBC:  Recent Labs Lab 09/30/15 1231 10/01/15 0541  WBC 4.6 4.8  HGB 10.5* 10.3*  HCT 32.0* 30.2*  MCV 87.5 87.0  PLT 148* 130*   CBG:  Recent Labs Lab 10/02/15 0713 10/02/15 1629 10/02/15 2045 10/03/15 0808 10/03/15 1203  GLUCAP 192* 185* 186* 147* 213*    Scheduled Meds: . amLODipine  5 mg Oral Daily  . atorvastatin  20 mg Oral QHS  . budesonide (PULMICORT) nebulizer solution  0.25 mg Nebulization BID  . clonazePAM  1 mg Oral TID  . conjugated estrogens  1 Applicatorful Vaginal Once per day on Mon Wed Fri  . desmopressin  0.2 mg Oral QHS  . docusate sodium  200 mg Oral  BID  . enoxaparin (LOVENOX) injection  40 mg Subcutaneous Q12H  . furosemide  40 mg Oral BID  . gabapentin  300 mg Oral Q1200  . gabapentin  600 mg Oral q morning - 10a  . gabapentin  600 mg Oral QPM  . hydrOXYzine  25 mg Oral QHS  . insulin aspart  0-15 Units Subcutaneous TID WC  . insulin aspart  0-5 Units Subcutaneous QHS  . insulin glargine  50 Units Subcutaneous QHS  . ipratropium-albuterol  3 mL Nebulization TID  . iron polysaccharides  150 mg Oral Daily  . levofloxacin  500 mg Oral Daily  . loratadine  10 mg Oral Daily  . losartan  100 mg Oral Daily  . metFORMIN  1,000 mg Oral BID WC  . metoprolol succinate  50 mg Oral BID  . mirabegron  ER  50 mg Oral QPM  . pantoprazole  40 mg Oral QAC breakfast  . raloxifene  60 mg Oral QHS  . sodium chloride  3 mL Intravenous Q12H  . vitamin E  1,000 Units Oral Daily    Assessment/Plan:  1. Acute respiratory failure with hypoxia. Continue oxygen supplementation. 2. Acute on chronic diastolic congestive heart failure- Lasix 40 mg orally twice a day. Continue metoprolol. 3. Upper respiratory tract infection, poor air entry- continue budesonide nebulizers and continue Levaquin. Continue DuoNeb. Patient states that she has an allergy to steroids and does not want to do systemic steroids. 4. Essential hypertension continue her usual medications 5. Type 2 diabetes mellitus- stop Actos. Continue metformin, Lantus and sliding scale. 6. Gastroesophageal reflux disease without esophagitis continue Protonix 7. Hyperlipidemia unspecified continue atorvastatin 8. History of breast cancer 9. Morbid obesity 10. Anemia history - on iron  Code Status:     Code Status Orders        Start     Ordered   09/30/15 1423  Full code   Continuous     09/30/15 1423    Advance Directive Documentation        Most Recent Value   Type of Advance Directive  Living will   Pre-existing out of facility DNR order (yellow form or pink MOST form)     "MOST" Form in Place?       Disposition Plan: Patient does not want to go to rehabilitation and would like to go home with home health.  Antibiotics:  Levaquin  Time spent: 25 minutes  Loletha Grayer  [facility]

## 2015-10-03 NOTE — Progress Notes (Signed)
SATURATION QUALIFICATIONS: (This note is used to comply with regulatory documentation for home oxygen)  Patient Saturations on Room Air at Rest =84%  Patient Saturations on Room Air while Ambulating = na%  Patient Saturations on 2 Liters of oxygen at rest = 95%  Please briefly explain why patient needs home oxygen: during rest sat dropped to 84% after applying oxygen at 2L sat quickly increased to 95%

## 2015-10-04 LAB — BASIC METABOLIC PANEL
Anion gap: 8 (ref 5–15)
BUN: 23 mg/dL — ABNORMAL HIGH (ref 6–20)
CALCIUM: 9.6 mg/dL (ref 8.9–10.3)
CO2: 32 mmol/L (ref 22–32)
CREATININE: 1.24 mg/dL — AB (ref 0.44–1.00)
Chloride: 96 mmol/L — ABNORMAL LOW (ref 101–111)
GFR, EST AFRICAN AMERICAN: 49 mL/min — AB (ref >60)
GFR, EST NON AFRICAN AMERICAN: 42 mL/min — AB (ref >60)
GLUCOSE: 142 mg/dL — AB (ref 65–99)
Potassium: 4.2 mmol/L (ref 3.5–5.1)
Sodium: 136 mmol/L (ref 135–145)

## 2015-10-04 LAB — CBC WITH DIFFERENTIAL/PLATELET
Basophils Absolute: 0.1 10*3/uL (ref 0–0.1)
Basophils Relative: 2 %
EOS PCT: 6 %
Eosinophils Absolute: 0.4 10*3/uL (ref 0–0.7)
HEMATOCRIT: 31 % — AB (ref 35.0–47.0)
HEMOGLOBIN: 10 g/dL — AB (ref 12.0–16.0)
LYMPHS ABS: 1.8 10*3/uL (ref 1.0–3.6)
LYMPHS PCT: 30 %
MCH: 28.1 pg (ref 26.0–34.0)
MCHC: 32.4 g/dL (ref 32.0–36.0)
MCV: 86.6 fL (ref 80.0–100.0)
Monocytes Absolute: 0.6 10*3/uL (ref 0.2–0.9)
Monocytes Relative: 10 %
NEUTROS ABS: 3.1 10*3/uL (ref 1.4–6.5)
NEUTROS PCT: 52 %
PLATELETS: 189 10*3/uL (ref 150–440)
RBC: 3.57 MIL/uL — AB (ref 3.80–5.20)
RDW: 15.1 % — AB (ref 11.5–14.5)
WBC: 6 10*3/uL (ref 3.6–11.0)

## 2015-10-04 LAB — GLUCOSE, CAPILLARY
GLUCOSE-CAPILLARY: 144 mg/dL — AB (ref 65–99)
GLUCOSE-CAPILLARY: 134 mg/dL — AB (ref 65–99)
GLUCOSE-CAPILLARY: 233 mg/dL — AB (ref 65–99)
GLUCOSE-CAPILLARY: 188 mg/dL — AB (ref 65–99)
GLUCOSE-CAPILLARY: 169 mg/dL — AB (ref 65–99)

## 2015-10-04 MED ORDER — BENZONATATE 100 MG PO CAPS
100.0000 mg | ORAL_CAPSULE | Freq: Three times a day (TID) | ORAL | Status: DC | PRN
  Administered 2015-10-04 (×3): 100 mg via ORAL
  Filled 2015-10-04 (×3): qty 1

## 2015-10-04 NOTE — Progress Notes (Signed)
Patient no longer requires cardiac monitoring, transferring to 1C. Report given to 1C RN Myriam Jacobson.

## 2015-10-04 NOTE — Progress Notes (Signed)
[place] [facility] [place] NAME: Carolyn Valdez    MR#:  [id number]  DATE OF BIRTH:  [birth year]  SUBJECTIVE:  CHIEF COMPLAINT:   Chief Complaint  Patient presents with  . Fatigue   Patient here due to shortness of breath and cough and wheezing. Still has some cough which is nonproductive. Overall feels weak.  REVIEW OF SYSTEMS:    Review of Systems  Constitutional: Positive for malaise/fatigue. Negative for fever and chills.  HENT: Negative for congestion and tinnitus.   Eyes: Negative for blurred vision and double vision.  Respiratory: Positive for cough. Negative for shortness of breath and wheezing.   Cardiovascular: Negative for chest pain, orthopnea and PND.  Gastrointestinal: Negative for nausea, vomiting, abdominal pain and diarrhea.  Genitourinary: Negative for dysuria and hematuria.  Neurological: Positive for weakness (generalized). Negative for dizziness, sensory change and focal weakness.  All other systems reviewed and are negative.   Nutrition: Heart healthy, controlled Tolerating Diet: Yes Tolerating PT: Await evaluation  DRUG ALLERGIES:   Allergies  Allergen Reactions  . Sulfa Antibiotics Hives  . Biaxin [Clarithromycin] Hives  . Eggs Or Egg-Derived Products Other (See Comments)    Pt states that her allergy test said she was allergic to eggs.    . Influenza A (H1n1) Monoval Vac Other (See Comments)    Pt states that she was told by her MD not to get the influenza vaccine.    . Morphine Other (See Comments)    Reaction:  Dizziness and confusion   . Pyridium [Phenazopyridine Hcl] Other (See Comments)    Reaction:  Unknown   . Ceftriaxone Anxiety  . Latex Rash  . Prednisone Rash  . Tape Rash    VITALS:  Blood pressure 148/45, pulse 83, temperature 98.9 F (37.2 C), temperature source Oral, resp. rate 16, height 5\' 7"  (1.702 m), weight 139.708 kg (308 lb), SpO2 95 %.  PHYSICAL EXAMINATION:   Physical  Exam  GENERAL:  74 y.o.-year-old obese patient lying in the bed with no acute distress.  EYES: Pupils equal, round, reactive to light and accommodation. No scleral icterus. Extraocular muscles intact.  HEENT: Head atraumatic, normocephalic. Oropharynx and nasopharynx clear.  NECK:  Supple, no jugular venous distention. No thyroid enlargement, no tenderness.  LUNGS: Normal breath sounds bilaterally, minimal rhonchi bilaterally, no rales, wheezing. No use of accessory muscles of respiration.  CARDIOVASCULAR: S1, S2 normal. No murmurs, rubs, or gallops.  ABDOMEN: Soft, nontender, nondistended. Bowel sounds present. No organomegaly or mass.  EXTREMITIES: No cyanosis, clubbing or +1-2 dependent edema b/l.   NEUROLOGIC: Cranial nerves II through XII are intact. No focal Motor or sensory deficits b/l.   PSYCHIATRIC: The patient is alert and oriented x 3. Good affect.   SKIN: No obvious rash, lesion, or ulcer.    LABORATORY PANEL:   CBC  Recent Labs Lab 10/04/15 0438  WBC 6.0  HGB 10.0*  HCT 31.0*  PLT 189   ------------------------------------------------------------------------------------------------------------------  Chemistries   Recent Labs Lab 10/04/15 0438  NA 136  K 4.2  CL 96*  CO2 32  GLUCOSE 142*  BUN 23*  CREATININE 1.24*  CALCIUM 9.6   ------------------------------------------------------------------------------------------------------------------  Cardiac Enzymes  Recent Labs Lab 09/30/15 2055  TROPONINI <0.03   ------------------------------------------------------------------------------------------------------------------  RADIOLOGY:  No results found.   ASSESSMENT AND PLAN:   74 year old female with past medical history of obesity, diabetes, history of diastolic CHF, history of SVT, hyperlipidemia, DJD, anxiety, recurrent UTI, urinary incontinence, who  presented to the hospital due to weakness and also due to acute respiratory failure.  #1  acute respiratory failure with hypoxia-due to acute bronchitis. -Continue Levaquin, budesonide nebs, DuoNeb's, continue Tessalon Perles. - slow to improve and will monitor. Assess for home O2 prior to discharge.   #2 diastolic CHF-chronic. -Clinically does not appear to be in acute CHF. -Continue Lasix, losartan, Toprol.  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  #4 neuropathy-continue Neurontin.  #5 Anxiety - cont. Klonopin.   #6 Hyperlipidemia - cont. Atorvastatin.   #7 GERD - cont. Protonix.     All the records are reviewed and case discussed with Care Management/Social Workerr. Management plans discussed with the patient, family and they are in agreement.  CODE STATUS: Full   DVT Prophylaxis: Lovenox  TOTAL TIME TAKING CARE OF THIS PATIENT: 30 minutes.   POSSIBLE D/C IN 1-2 DAYS, DEPENDING ON CLINICAL CONDITION.   Henreitta Leber M.D on 10/04/2015 at 4:19 PM  Between 7am to 6pm - Pager - [phone]  After 6pm go to [email or web address] - password [email or web address] [facility]  [place] [facility]  Office  [phone]  CC: Primary care physician; Adrian Prows, MD

## 2015-10-05 LAB — GLUCOSE, CAPILLARY
GLUCOSE-CAPILLARY: 215 mg/dL — AB (ref 65–99)
GLUCOSE-CAPILLARY: 178 mg/dL — AB (ref 65–99)
Glucose-Capillary: 147 mg/dL — ABNORMAL HIGH (ref 65–99)
Glucose-Capillary: 187 mg/dL — ABNORMAL HIGH (ref 65–99)

## 2015-10-05 MED ORDER — IPRATROPIUM-ALBUTEROL 0.5-2.5 (3) MG/3ML IN SOLN
3.0000 mL | RESPIRATORY_TRACT | Status: DC
  Administered 2015-10-05 – 2015-10-06 (×6): 3 mL via RESPIRATORY_TRACT
  Filled 2015-10-05 (×6): qty 3

## 2015-10-05 MED ORDER — GUAIFENESIN ER 600 MG PO TB12
600.0000 mg | ORAL_TABLET | Freq: Two times a day (BID) | ORAL | Status: DC
Start: 2015-10-05 — End: 2015-10-07
  Administered 2015-10-05 – 2015-10-07 (×5): 600 mg via ORAL
  Filled 2015-10-05 (×5): qty 1

## 2015-10-05 MED ORDER — FUROSEMIDE 40 MG PO TABS
40.0000 mg | ORAL_TABLET | Freq: Every day | ORAL | Status: DC
  Administered 2015-10-06 – 2015-10-07 (×2): 40 mg via ORAL
  Filled 2015-10-05 (×2): qty 1

## 2015-10-05 NOTE — Plan of Care (Signed)
Problem: Safety: Goal: Ability to remain free from injury will improve Outcome: Progressing Free from injury today. Assisted oob to chair and tol well. Using bedpan will  Encourage to use  Bed side commode or amd to toilet  With assist  Problem: Pain Managment: Goal: General experience of comfort will improve Outcome: Progressing Home meds cont  Problem: Fluid Volume: Goal: Ability to maintain a balanced intake and output will improve Outcome: Progressing No ivfs lasix  Cont.  Dose decreased from 80mg  twice a day to 40 mg daily. Diuresing well.  Problem: Nutrition: Goal: Adequate nutrition will be maintained Outcome: Progressing Encouraging pt to  Try  To eat more  Problem: Bowel/Gastric: Goal: Will not experience complications related to bowel motility Outcome: Progressing bm this pm

## 2015-10-05 NOTE — Plan of Care (Signed)
Problem: Consults Goal: Respiratory Problems Patient Education See Patient Education Module for education specifics.  Outcome: Progressing Pt has been reporting shoulder pain during shift. Pt report she thinks the air blowing down is making her worse. Pt voice sounds rougher than it was at first assessment. Pt bed was moved from under air but pt report she still feels the air. No other signs of distress noted. Will continue to monitor.

## 2015-10-05 NOTE — Progress Notes (Signed)
02 cont. Alert.  Using bedpan at [place] to void.  bm this pm.pt on lasix and  K.   Assisted oob to chair. tol well.

## 2015-10-05 NOTE — Progress Notes (Signed)
Acc[facility] at [place] NAME: Adea Trupia    MR#:  [id number]  DATE OF BIRTH:  [birth year]  SUBJECTIVE:  CHIEF COMPLAINT:   Chief Complaint  Patient presents with  . Fatigue   Patient here due to shortness of breath and cough and wheezing. Still has cough which is nonproductive. Still feels quite weak.    REVIEW OF SYSTEMS:    Review of Systems  Constitutional: Positive for malaise/fatigue. Negative for fever and chills.  HENT: Negative for congestion and tinnitus.   Eyes: Negative for blurred vision and double vision.  Respiratory: Positive for cough. Negative for shortness of breath and wheezing.   Cardiovascular: Negative for chest pain, orthopnea and PND.  Gastrointestinal: Negative for nausea, vomiting, abdominal pain and diarrhea.  Genitourinary: Negative for dysuria and hematuria.  Neurological: Positive for weakness (generalized). Negative for dizziness, sensory change and focal weakness.  All other systems reviewed and are negative.   Nutrition: Heart healthy, controlled Tolerating Diet: Yes Tolerating PT: Await evaluation  DRUG ALLERGIES:   Allergies  Allergen Reactions  . Sulfa Antibiotics Hives  . Biaxin [Clarithromycin] Hives  . Eggs Or Egg-Derived Products Other (See Comments)    Pt states that her allergy test said she was allergic to eggs.    . Influenza A (H1n1) Monoval Vac Other (See Comments)    Pt states that she was told by her MD not to get the influenza vaccine.    . Morphine Other (See Comments)    Reaction:  Dizziness and confusion   . Pyridium [Phenazopyridine Hcl] Other (See Comments)    Reaction:  Unknown   . Ceftriaxone Anxiety  . Latex Rash  . Prednisone Rash  . Tape Rash    VITALS:  Blood pressure 140/45, pulse 77, temperature 98 F (36.7 C), temperature source Oral, resp. rate 20, height 5\' 7"  (1.702 m), weight 139.708 kg (308 lb), SpO2 97 %.  PHYSICAL EXAMINATION:   Physical  Exam  GENERAL:  74 y.o.-year-old obese patient sitting up in chair in no acute distress.  EYES: Pupils equal, round, reactive to light and accommodation. No scleral icterus. Extraocular muscles intact.  HEENT: Head atraumatic, normocephalic. Oropharynx and nasopharynx clear.  NECK:  Supple, no jugular venous distention. No thyroid enlargement, no tenderness.  LUNGS: Normal breath sounds bilaterally, end-exp. wheezing bilaterally. No rhonchi, rales. No use of accessory muscles of respiration.  CARDIOVASCULAR: S1, S2 normal. No murmurs, rubs, or gallops.  ABDOMEN: Soft, nontender, nondistended. Bowel sounds present. No organomegaly or mass.  EXTREMITIES: No cyanosis, clubbing or +1-2 dependent edema b/l.   NEUROLOGIC: Cranial nerves II through XII are intact. No focal Motor or sensory deficits b/l.   PSYCHIATRIC: The patient is alert and oriented x 3. Good affect.   SKIN: No obvious rash, lesion, or ulcer.    LABORATORY PANEL:   CBC  Recent Labs Lab 10/04/15 0438  WBC 6.0  HGB 10.0*  HCT 31.0*  PLT 189   ------------------------------------------------------------------------------------------------------------------  Chemistries   Recent Labs Lab 10/04/15 0438  NA 136  K 4.2  CL 96*  CO2 32  GLUCOSE 142*  BUN 23*  CREATININE 1.24*  CALCIUM 9.6   ------------------------------------------------------------------------------------------------------------------  Cardiac Enzymes  Recent Labs Lab 09/30/15 2055  TROPONINI <0.03   ------------------------------------------------------------------------------------------------------------------  RADIOLOGY:  No results found.   ASSESSMENT AND PLAN:   74 year old female with past medical history of obesity, diabetes, history of diastolic CHF, history of SVT, hyperlipidemia, DJD, anxiety, recurrent UTI, urinary  incontinence, who presented to the hospital due to weakness and also due to acute respiratory failure.  #1  acute respiratory failure with hypoxia-due to acute bronchitis. -Slow to improve and continues to have some wheezing. Patient apparently has adverse reactions to IV steroids which I will hold for now. -Continue Levaquin, budesonide nebs, I will increase DuoNeb's frequency, continue Tessalon Perles and will add mucinex. -slow to improve and will monitor. Assess for home O2 prior to discharge.   #2 diastolic CHF-chronic. -Clinically does not appear to be in acute CHF. -Continue Lasix, losartan, Toprol.  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  #4 neuropathy-continue Neurontin.  #5 Anxiety - cont. Klonopin.   #6 Hyperlipidemia - cont. Atorvastatin.   #7 GERD - cont. Protonix.    Await PT eval.   All the records are reviewed and case discussed with Care Management/Social Workerr. Management plans discussed with the patient, family and they are in agreement.  CODE STATUS: Full   DVT Prophylaxis: Lovenox  TOTAL TIME TAKING CARE OF THIS PATIENT: 30 minutes.   POSSIBLE D/C IN 1-2 DAYS, DEPENDING ON CLINICAL CONDITION.   Henreitta Leber M.D on 10/05/2015 at 1:46 PM  Between 7am to 6pm - Pager - [phone]  After 6pm go to [email or web address] - password [email or web address] [facility]  [place] [facility]  Office  [phone]  CC: Primary care physician; Adrian Prows, MD

## 2015-10-06 LAB — GLUCOSE, CAPILLARY
GLUCOSE-CAPILLARY: 124 mg/dL — AB (ref 65–99)
GLUCOSE-CAPILLARY: 154 mg/dL — AB (ref 65–99)
Glucose-Capillary: 113 mg/dL — ABNORMAL HIGH (ref 65–99)
Glucose-Capillary: 251 mg/dL — ABNORMAL HIGH (ref 65–99)

## 2015-10-06 MED ORDER — IPRATROPIUM-ALBUTEROL 0.5-2.5 (3) MG/3ML IN SOLN
3.0000 mL | Freq: Four times a day (QID) | RESPIRATORY_TRACT | Status: DC
  Administered 2015-10-06 – 2015-10-07 (×2): 3 mL via RESPIRATORY_TRACT
  Filled 2015-10-06 (×2): qty 3

## 2015-10-06 NOTE — Care Management (Signed)
O2 tank delivered on 10/04/15 by [place] to this room; New O2 sats/assessments needed prior to discharge (assessment needed every 48 hours of hospitalization).RN notified; Will with [facility] updated.  Her PCP is DR. [name]. Patient lives with a dog. She has a sister that helps with transportation. Home health previously arranged with [facility] also--orders needed. She uses [facility] for Rx (new to this patient). RNCM will continue to follow.

## 2015-10-06 NOTE — Progress Notes (Addendum)
Initial Nutrition Assessment   INTERVENTION:   Meals and Snacks: Cater to patient preferences to optimize nutritional intake Coordination of Care: recommend daily weights as pt with h/o CHF   NUTRITION DIAGNOSIS:   No nutrition diagnosis at this time  GOAL:   Patient will meet greater than or equal to 90% of their needs  MONITOR:    (Energy Intake, Electrolyte and renal Profile, Anthropometrics, Glucose Profile)  REASON FOR ASSESSMENT:   LOS    ASSESSMENT:    Pt admitted with acute respiratory failure with mild CHF and upper respiratory infection. Pt unavailable on rounds this am.   Past Medical History  Diagnosis Date  . Diabetes mellitus without complication ([place])   . Breast cancer ([place])     16 yrs ago  . Hypercholesteremia   . DJD (degenerative joint disease)   . SVT (supraventricular tachycardia) ([place])   . Kidney stone   . H/O total knee replacement     right  . Dysuria   . HTN (hypertension)   . CHF (congestive heart failure) ([place])   . Anxiety   . Yeast vaginitis   . Vaginal atrophy   . Recurrent urinary tract infection   . Obesity   . Urinary incontinence     Diet Order:  Diet heart healthy/carb modified Room service appropriate?: Yes; Fluid consistency:: Thin    Current Nutrition: Pt eating 70% of breakfast this am. Per recorded po intake pt eating 81% of meals on average. Per MST no decrease in appetite PTA.    Scheduled Medications:  . amLODipine  5 mg Oral Daily  . atorvastatin  20 mg Oral QHS  . budesonide (PULMICORT) nebulizer solution  0.25 mg Nebulization BID  . clonazePAM  1 mg Oral TID  . conjugated estrogens  1 Applicatorful Vaginal Once per day on Mon Wed Fri  . desmopressin  0.2 mg Oral QHS  . docusate sodium  200 mg Oral BID  . enoxaparin (LOVENOX) injection  40 mg Subcutaneous Q12H  . furosemide  40 mg Oral Daily  . gabapentin  300 mg Oral Q1200  . gabapentin  600 mg Oral q morning - 10a  . gabapentin  600 mg Oral QPM  .  guaiFENesin  600 mg Oral BID  . hydrOXYzine  25 mg Oral QHS  . insulin aspart  0-15 Units Subcutaneous TID WC  . insulin aspart  0-5 Units Subcutaneous QHS  . insulin glargine  50 Units Subcutaneous QHS  . ipratropium-albuterol  3 mL Nebulization Q4H  . iron polysaccharides  150 mg Oral Daily  . levofloxacin  500 mg Oral Daily  . loratadine  10 mg Oral Daily  . losartan  100 mg Oral Daily  . metFORMIN  1,000 mg Oral BID WC  . metoprolol succinate  50 mg Oral BID  . mirabegron ER  50 mg Oral QPM  . pantoprazole  40 mg Oral QAC breakfast  . raloxifene  60 mg Oral QHS  . sodium chloride  3 mL Intravenous Q12H  . vitamin E  1,000 Units Oral Daily     Electrolyte/Renal Profile and Glucose Profile:   Recent Labs Lab 09/30/15 1231 10/01/15 0541 10/04/15 0438  NA 137 138 136  K 4.3 4.2 4.2  CL 99* 100* 96*  CO2 28 32 32  BUN 25* 24* 23*  CREATININE 1.02* 1.10* 1.24*  CALCIUM 9.4 9.3 9.6  GLUCOSE 208* 81 142*   Protein Profile: No results for input(s): ALBUMIN in the last 168 hours.  Gastrointestinal Profile: Last BM:  10/06/2015    Weight Change: Per CHL encounters 2% weight loss from last month.   Skin:  Reviewed, no issues   Height:   Ht Readings from Last 1 Encounters:  09/30/15 5\' 7"  (1.702 m)    Weight:   Wt Readings from Last 1 Encounters:  09/30/15 308 lb (139.708 kg)    Wt Readings from Last 10 Encounters:  09/30/15 308 lb (139.708 kg)  09/05/15 315 lb (142.883 kg)  05/08/15 319 lb 9.6 oz (144.97 kg)  04/17/15 320 lb 6.4 oz (145.332 kg)  03/13/15 318 lb 9 oz (144.5 kg)     BMI:  Body mass index is 48.23 kg/(m^2).   LOW Care Level  Dwyane Luo, [place], [place] Pager [phone]

## 2015-10-06 NOTE — Progress Notes (Signed)
Pt using bedpan well.  Did not want to get up to [facility]. VSS Pt to go home on O2. Right shoulder pain relieved with Vicodin Continue on Levaquin, Tessalon perles and mucines.

## 2015-10-06 NOTE — Progress Notes (Signed)
Physical Therapy Treatment Patient Details Name: Carolyn Valdez MRN: [id number] DOB: Dec 08, 1940 Today's Date: 10/06/2015    History of Present Illness Pt is a 74 y.o. female presenting to hospital with SOB and cough about 1 week (pt recently returned home from [facility]).  Pt admitted with upper respiratory tract infection vs acute diastolic CHF.  PMH includes morbid obesity, diabetes, R TKA, heart failure.    PT Comments    Pt CGA to SBA with transfers and ambulation using bariatric RW; no loss of balance noted during session.  Pt demonstrating decreased cadence with ambulation compared to eval and appearing more fatigued with activity but able to pace herself.  Pt wants to discharge home.  Pt appearing deconditioned compared to eval and discussed this with pt and pt reports she will see if she can get someone to stay with her to assist with daily activities and also O2 management (pt new to home O2).  Recommend pt discharge home if pt has 24/7 assist; if not, pt may need STR pending pt's progress during hospital stay.   Follow Up Recommendations  Supervision/Assistance - 24 hour;Home health PT     Equipment Recommendations       Recommendations for Other Services       Precautions / Restrictions Precautions Precautions: Fall Restrictions Weight Bearing Restrictions: No    Mobility  Bed Mobility               General bed mobility comments: Deferred d/t pt already sitting up in bedside chair  Transfers Overall transfer level: Needs assistance Equipment used: Rolling walker (2 wheeled) (Bariatric) Transfers: Sit to/from Stand;Stand Pivot Transfers Sit to Stand: Supervision;Min guard Stand pivot transfers: Supervision;Min guard (toilet transfer)       General transfer comment: increased effort and time to stand but steady without loss of balance  Ambulation/Gait Ambulation/Gait assistance: Supervision;Min guard Ambulation Distance (Feet): 120 Feet Assistive device: Rolling  walker (2 wheeled) (Bariatric)   Gait velocity: significantly decreased cadence   General Gait Details: decreased B step length/foot clearance/heelstrike; steady without loss of balance; multiple standing rest breaks d/t fatigue   Stairs            Wheelchair Mobility    Modified Rankin (Stroke Patients Only)       Balance Overall balance assessment: Needs assistance Sitting-balance support: Bilateral upper extremity supported;Feet supported Sitting balance-Leahy Scale: Normal     Standing balance support: Bilateral upper extremity supported (on RW) Standing balance-Leahy Scale: Good                      Cognition Arousal/Alertness: Awake/alert Behavior During Therapy: WFL for tasks assessed/performed Overall Cognitive Status: Within Functional Limits for tasks assessed                      Exercises      General Comments   Nursing cleared pt for participation in physical therapy.  Pt agreeable to PT session.       Pertinent Vitals/Pain Pain Assessment: 0-10 Pain Score: 4  Pain Location: R shoulder pain Pain Descriptors / Indicators: Sore;Tender Pain Intervention(s): Limited activity within patient's tolerance;Monitored during session;Repositioned  Vitals stable and WFL throughout treatment session on 2 L/min via nasal cannula.    Home Living                      Prior Function            PT Goals (  current goals can now be found in the care plan section) Acute Rehab PT Goals Patient Stated Goal: to improve activity tolerance PT Goal Formulation: With patient Time For Goal Achievement: 10/15/15 Potential to Achieve Goals: Good Progress towards PT goals: Progressing toward goals    Frequency  Min 2X/week    PT Plan Discharge plan needs to be updated    Co-evaluation             End of Session Equipment Utilized During Treatment: Gait belt;Oxygen Activity Tolerance: Patient limited by fatigue Patient left: in  chair;with call bell/phone within reach;with chair alarm set     Time: [id number] PT Time Calculation (min) (ACUTE ONLY): 33 min  Charges:  $Gait Training: 8-22 mins $Therapeutic Activity: 8-22 mins                    G CodesLeitha Valdez 2015-10-07, 2:28 PM Carolyn Valdez, [place]

## 2015-10-06 NOTE — Progress Notes (Signed)
[place] at [place] NAME: Carolyn Valdez    MR#:  [id number]  DATE OF BIRTH:  08/23/1941  SUBJECTIVE:   Patient here due to shortness of breath and cough and wheezing. Coughing and wheezing is improved but still feels weak overall. Await physical therapy reevaluation  REVIEW OF SYSTEMS:    Review of Systems  Constitutional: Positive for malaise/fatigue. Negative for fever and chills.  HENT: Negative for congestion and tinnitus.   Eyes: Negative for blurred vision and double vision.  Respiratory: Positive for cough (improved). Negative for shortness of breath and wheezing.   Cardiovascular: Negative for chest pain, orthopnea and PND.  Gastrointestinal: Negative for nausea, vomiting, abdominal pain and diarrhea.  Genitourinary: Negative for dysuria and hematuria.  Neurological: Positive for weakness (generalized). Negative for dizziness, sensory change and focal weakness.  All other systems reviewed and are negative.   Nutrition: Heart healthy, controlled Tolerating Diet: Yes Tolerating PT: Await Reevaluation.   DRUG ALLERGIES:   Allergies  Allergen Reactions  . Sulfa Antibiotics Hives  . Biaxin [Clarithromycin] Hives  . Eggs Or Egg-Derived Products Other (See Comments)    Pt states that her allergy test said she was allergic to eggs.    . Influenza A (H1n1) Monoval Vac Other (See Comments)    Pt states that she was told by her MD not to get the influenza vaccine.    . Morphine Other (See Comments)    Reaction:  Dizziness and confusion   . Pyridium [Phenazopyridine Hcl] Other (See Comments)    Reaction:  Unknown   . Ceftriaxone Anxiety  . Latex Rash  . Prednisone Rash  . Tape Rash    VITALS:  Blood pressure 125/40, pulse 67, temperature 97.4 F (36.3 C), temperature source Oral, resp. rate 22, height 5\' 7"  (1.702 m), weight 139.708 kg (308 lb), SpO2 94 %.  PHYSICAL EXAMINATION:   Physical Exam  GENERAL:  74  y.o.-year-old obese patient lying in bed in no acute distress.  EYES: Pupils equal, round, reactive to light and accommodation. No scleral icterus. Extraocular muscles intact.  HEENT: Head atraumatic, normocephalic. Oropharynx and nasopharynx clear.  NECK:  Supple, no jugular venous distention. No thyroid enlargement, no tenderness.  LUNGS: Normal breath sounds bilaterally, minimal end-exp. wheezing bilaterally. No rhonchi, rales. No use of accessory muscles of respiration.  CARDIOVASCULAR: S1, S2 normal. No murmurs, rubs, or gallops.  ABDOMEN: Soft, nontender, nondistended. Bowel sounds present. No organomegaly or mass.  EXTREMITIES: No cyanosis, clubbing or +1-2 dependent edema b/l.   NEUROLOGIC: Cranial nerves II through XII are intact. No focal Motor or sensory deficits b/l.   PSYCHIATRIC: The patient is alert and oriented x 3. Good affect.   SKIN: No obvious rash, lesion, or ulcer. Dry skin on LE b/l.     LABORATORY PANEL:   CBC  Recent Labs Lab 10/04/15 0438  WBC 6.0  HGB 10.0*  HCT 31.0*  PLT 189   ------------------------------------------------------------------------------------------------------------------  Chemistries   Recent Labs Lab 10/04/15 0438  NA 136  K 4.2  CL 96*  CO2 32  GLUCOSE 142*  BUN 23*  CREATININE 1.24*  CALCIUM 9.6   ------------------------------------------------------------------------------------------------------------------  Cardiac Enzymes  Recent Labs Lab 09/30/15 2055  TROPONINI <0.03   ------------------------------------------------------------------------------------------------------------------  RADIOLOGY:  No results found.   ASSESSMENT AND PLAN:   74 year old female with past medical history of obesity, diabetes, history of diastolic CHF, history of SVT, hyperlipidemia, DJD, anxiety, recurrent UTI, urinary incontinence, who presented to  the hospital due to weakness and also due to acute respiratory failure.  #1  acute respiratory failure with hypoxia-due to acute bronchitis. -Slow to improve. Patient apparently has adverse reactions to IV steroids which I will cont. to hold for now. -Continue Levaquin (last day tomorrow), budesonide nebs, DuoNeb's every 4 hrs, continue Tessalon Perles, mucinex. -Will need oxygen at home upon discharge.  #2 diastolic CHF-chronic. -Clinically does not appear to be in acute CHF. -Continue Lasix, losartan, Toprol.  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  #4 neuropathy-continue Neurontin.  #5 Anxiety - cont. Klonopin.   #6 Hyperlipidemia - cont. Atorvastatin.   #7 GERD - cont. Protonix.    Was seen by physical therapy and recommended home health. She needs a reevaluation as that was 5 days ago.  Likely needs short-term rehabilitation but patient has refused this in the past.  All the records are reviewed and case discussed with Care Management/Social Workerr. Management plans discussed with the patient, family and they are in agreement.  CODE STATUS: Full   DVT Prophylaxis: Lovenox  TOTAL TIME TAKING CARE OF THIS PATIENT: 30 minutes.   POSSIBLE D/C IN 1-2 DAYS, DEPENDING ON CLINICAL CONDITION.   Henreitta Leber M.D on 10/06/2015 at 2:16 PM  Between 7am to 6pm - Pager - [phone]  After 6pm go to [email or web address] - password [email or web address] [facility]  [place] [facility]  Office  [phone]  CC: Primary care physician; Adrian Prows, MD

## 2015-10-06 NOTE — Progress Notes (Signed)
O2 weaned to 1 lpm at rest with 95 sat

## 2015-10-06 NOTE — Care Management Important Message (Signed)
Important Message  Patient Details  Name: TRANESHA DRUMHELLER MRN: [id number] Date of Birth: [birth year]   Medicare Important Message Given:  Yes    [name] A Rahil Passey 10/06/2015, 10:01 AM

## 2015-10-07 LAB — GLUCOSE, CAPILLARY
Glucose-Capillary: 112 mg/dL — ABNORMAL HIGH (ref 65–99)
Glucose-Capillary: 166 mg/dL — ABNORMAL HIGH (ref 65–99)

## 2015-10-07 LAB — CREATININE, SERUM
CREATININE: 1.58 mg/dL — AB (ref 0.44–1.00)
GFR calc Af Amer: 37 mL/min — ABNORMAL LOW (ref >60)
GFR calc non Af Amer: 32 mL/min — ABNORMAL LOW (ref >60)

## 2015-10-07 MED ORDER — FUROSEMIDE 40 MG PO TABS: 40.0000 mg | ORAL_TABLET | Freq: Every day | ORAL | Status: DC

## 2015-10-07 MED ORDER — BENZONATATE 100 MG PO CAPS: 100.0000 mg | ORAL_CAPSULE | Freq: Three times a day (TID) | ORAL | Status: DC | PRN

## 2015-10-07 MED ORDER — IPRATROPIUM-ALBUTEROL 0.5-2.5 (3) MG/3ML IN SOLN: 3.0000 mL | RESPIRATORY_TRACT | Status: DC | PRN

## 2015-10-07 MED ORDER — IPRATROPIUM-ALBUTEROL 0.5-2.5 (3) MG/3ML IN SOLN
RESPIRATORY_TRACT | Status: AC
  Administered 2015-10-07: 3 mL
  Filled 2015-10-07: qty 3

## 2015-10-07 NOTE — Progress Notes (Addendum)
Pt is alert and oriented x 4, irritable at times, denies pain, resting in between care, fair appetite, up to bathroom with stand by assist, on 1-2 L oxygen , pt is d/c to home with home health services, Rx [facility] tessalon perrel [facility]. Tolerating activity, reports understanding of d/c instructions, has no further questions at this time. Creatinine remains elevated but stable, pt is to f/u with CHF clinic on 1/4 and with pcp within 2 weeks. Uneventful shift. Pushed to visitor entrance via nursing staff.

## 2015-10-07 NOTE — Discharge Summary (Signed)
[facility] NAME: Carolyn Valdez    MR#:  [id number]  DATE OF BIRTH:  08-Nov-1940  DATE OF ADMISSION:  09/30/2015 ADMITTING PHYSICIAN: Loletha Grayer, MD  DATE OF DISCHARGE: 10/07/2015  2:36 PM  PRIMARY CARE PHYSICIAN: Adrian Prows, MD    ADMISSION DIAGNOSIS:  Dyspnea [R06.00] Acute on chronic congestive heart failure, unspecified congestive heart failure type ([place]) [I50.9]  DISCHARGE DIAGNOSIS:  Active Problems:   Acute respiratory failure with hypoxia ([place])   SECONDARY DIAGNOSIS:   Past Medical History  Diagnosis Date  . Diabetes mellitus without complication ([place])   . Breast cancer ([place])     16 yrs ago  . Hypercholesteremia   . DJD (degenerative joint disease)   . SVT (supraventricular tachycardia) ([place])   . Kidney stone   . H/O total knee replacement     right  . Dysuria   . HTN (hypertension)   . CHF (congestive heart failure) ([place])   . Anxiety   . Yeast vaginitis   . Vaginal atrophy   . Recurrent urinary tract infection   . Obesity   . Urinary incontinence     HOSPITAL COURSE:   74 year old female with past medical history of obesity, diabetes, history of diastolic CHF, history of SVT, hyperlipidemia, DJD, anxiety, recurrent UTI, urinary incontinence, who presented to the hospital due to weakness and also due to acute respiratory failure.  #1 acute respiratory failure with hypoxia-this was likely secondary to acute bronchitis. -Patient was treated aggressively with budesonide nebs, DuoNeb's, Tessalon Perles, oral antibiotics with Levaquin and has significantly improved since admission. -She still continues to have a cough which is nonproductive for which she will continue her Tessalon Perles. -She is being discharged home on home O2 with home health nursing and physical therapy services.  #2 diastolic CHF-chronic. -Clinically while in the hospital she was not in CHF. -She will Continue Lasix,  losartan, Toprol.  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  -She has been taken off her metformin as her renal function was a bit elevated. This can be further addressed as an outpatient with her primary care physician. She will continue her Actos and insulin as stated above.  #4 neuropathy-she will continue Neurontin.  #5 Anxiety - she will continue her Klonopin.    #6 Hyperlipidemia - she will cont. Her Atorvastatin.    #7 GERD - she will cont. Protonix.   Pt. Was discharged home with Home health Nursing, PT, OT, Aide, Social work services.    DISCHARGE CONDITIONS:   Stable.   CONSULTS OBTAINED:     DRUG ALLERGIES:   Allergies  Allergen Reactions  . Sulfa Antibiotics Hives  . Biaxin [Clarithromycin] Hives  . Eggs Or Egg-Derived Products Other (See Comments)    Pt states that her allergy test said she was allergic to eggs.    . Influenza A (H1n1) [place] Vac Other (See Comments)    Pt states that she was told by her MD not to get the influenza vaccine.    . Morphine Other (See Comments)    Reaction:  Dizziness and confusion   . Pyridium [Phenazopyridine Hcl] Other (See Comments)    Reaction:  Unknown   . Ceftriaxone Anxiety  . Latex Rash  . Prednisone Rash  . Tape Rash    DISCHARGE MEDICATIONS:   Discharge Medication List as of 10/07/2015  1:49 PM  START taking these medications   Details  benzonatate (TESSALON) 100 MG capsule Take 1 capsule (100 mg total) by mouth 3 (three) times daily as needed for cough., Starting 10/07/2015, Until Discontinued, Print      CONTINUE these medications which have CHANGED   Details  furosemide (LASIX) 40 MG tablet Take 1 tablet (40 mg total) by mouth daily., Starting 10/07/2015, Until Discontinued, No Print      CONTINUE these medications which have NOT CHANGED   Details  amLODipine (NORVASC) 5 MG tablet Take 5 mg by mouth  daily., Until Discontinued, Historical Med    atorvastatin (LIPITOR) 20 MG tablet Take 20 mg by mouth at bedtime., Until Discontinued, Historical Med    clonazePAM (KLONOPIN) 1 MG tablet Take 1 mg by mouth 3 (three) times daily., Until Discontinued, Historical Med    conjugated estrogens (PREMARIN) vaginal cream Place 1 Applicatorful vaginally 3 (three) times a week. Pt uses on Monday, Wednesday, and Friday., Until Discontinued, Historical Med    desmopressin (DDAVP) 0.2 MG tablet Take 0.2 mg by mouth at bedtime., Until Discontinued, Historical Med    docusate sodium (COLACE) 100 MG capsule Take 200 mg by mouth 2 (two) times daily., Until Discontinued, Historical Med    fexofenadine (ALLEGRA) 180 MG tablet Take 180 mg by mouth daily., Until Discontinued, Historical Med    gabapentin (NEURONTIN) 300 MG capsule Take 300-600 mg by mouth 3 (three) times daily. Pt takes two capsules in the morning, one capsule at lunch, and two capsules in the evening., Until Discontinued, Historical Med    hydrOXYzine (ATARAX/VISTARIL) 25 MG tablet Take 25 mg by mouth at bedtime. , Until Discontinued, Historical Med    insulin glargine (LANTUS) 100 UNIT/ML injection Inject 50 Units into the skin at bedtime., Until Discontinued, Historical Med    iron polysaccharides (NIFEREX) 150 MG capsule Take 150 mg by mouth daily., Until Discontinued, Historical Med    losartan (COZAAR) 100 MG tablet Take 100 mg by mouth daily., Until Discontinued, Historical Med    metoprolol succinate (TOPROL-XL) 100 MG 24 hr tablet Take 100 mg by mouth 2 (two) times daily. , Until Discontinued, Historical Med    mirabegron ER (MYRBETRIQ) 50 MG TB24 tablet Take 50 mg by mouth every evening., Until Discontinued, Historical Med    omeprazole (PRILOSEC) 40 MG capsule Take 40 mg by mouth 2 (two) times daily., Until Discontinued, Historical Med    pioglitazone (ACTOS) 30 MG tablet Take 30 mg by mouth daily., Until Discontinued, Historical  Med    raloxifene (EVISTA) 60 MG tablet Take 60 mg by mouth at bedtime., Until Discontinued, Historical Med    vitamin E 1000 UNIT capsule Take 1,000 Units by mouth daily., Until Discontinued, Historical Med    metFORMIN (GLUCOPHAGE) 1000 MG tablet Take 1,000 mg by mouth 2 (two) times daily with a meal., Until Discontinued, Historical Med      STOP taking these medications     nitrofurantoin, macrocrystal-monohydrate, (MACROBID) 100 MG capsule          DISCHARGE INSTRUCTIONS:   DIET:  Cardiac diet and Diabetic diet  DISCHARGE CONDITION:  Stable  ACTIVITY:  Activity as tolerated  OXYGEN:  Home Oxygen: Yes.     Oxygen Delivery: 2 liters/min via Patient connected to nasal cannula oxygen  DISCHARGE LOCATION:  Home with home health nursing, PT, OT, Aid and social work.   If you experience worsening of your admission symptoms, develop shortness of breath, life threatening emergency, suicidal or homicidal thoughts you must  seek medical attention immediately by calling 911 or calling your MD immediately  if symptoms less severe.  You Must read complete instructions/literature along with all the possible adverse reactions/side effects for all the Medicines you take and that have been prescribed to you. Take any new Medicines after you have completely understood and accpet all the possible adverse reactions/side effects.   Please note  You were cared for by a hospitalist during your hospital stay. If you have any questions about your discharge medications or the care you received while you were in the hospital after you are discharged, you can call the unit and asked to speak with the hospitalist on call if the hospitalist that took care of you is not available. Once you are discharged, your primary care physician will handle any further medical issues. Please note that NO REFILLS for any discharge medications will be authorized once you are discharged, as it is imperative that you  return to your primary care physician (or establish a relationship with a primary care physician if you do not have one) for your aftercare needs so that they can reassess your need for medications and monitor your lab values.     Today   Has a cough but it's nonproductive. Shortness of breath is improved. Feels somewhat weak.  VITAL SIGNS:  Blood pressure 134/75, pulse 82, temperature 98.1 F (36.7 C), temperature source Oral, resp. rate 20, height 5\' 7"  (1.702 m), weight 132.904 kg (293 lb), SpO2 91 %.  I/O:    Intake/Output Summary (Last 24 hours) at 10/07/15 1704 Last data filed at 10/07/15 0725  Gross per 24 hour  Intake    480 ml  Output      0 ml  Net    480 ml    PHYSICAL EXAMINATION:   GENERAL: 74 y.o.-year-old obese patient lying in bed in no acute distress.  EYES: Pupils equal, round, reactive to light and accommodation. No scleral icterus. Extraocular muscles intact.  HEENT: Head atraumatic, normocephalic. Oropharynx and nasopharynx clear.  NECK: Supple, no jugular venous distention. No thyroid enlargement, no tenderness.  LUNGS: Normal breath sounds bilaterally, minimal end-exp. wheezing bilaterally. No rhonchi, rales. No use of accessory muscles of respiration.  CARDIOVASCULAR: S1, S2 normal. No murmurs, rubs, or gallops.  ABDOMEN: Soft, nontender, nondistended. Bowel sounds present. No organomegaly or mass.  EXTREMITIES: No cyanosis, clubbing or +1-2 dependent edema b/l.  NEUROLOGIC: Cranial nerves II through XII are intact. No focal Motor or sensory deficits b/l.  PSYCHIATRIC: The patient is alert and oriented x 3. Good affect.  SKIN: No obvious rash, lesion, or ulcer. Dry skin on LE b/l.   DATA REVIEW:   CBC  Recent Labs Lab 10/04/15 0438  WBC 6.0  HGB 10.0*  HCT 31.0*  PLT 189    Chemistries   Recent Labs Lab 10/04/15 0438 10/07/15 0523  NA 136  --   K 4.2  --   CL 96*  --   CO2 32  --   GLUCOSE 142*  --   BUN 23*  --    CREATININE 1.24* 1.58*  CALCIUM 9.6  --     Cardiac Enzymes  Recent Labs Lab 09/30/15 2055  TROPONINI <0.03    Microbiology Results  Results for orders placed or performed during the hospital encounter of 09/05/15  Urine culture     Status: None   Collection Time: 09/05/15  4:15 PM  Result Value Ref Range Status   Specimen Description URINE, CATHETERIZED  Final  Special Requests Normal  Final   Culture NO GROWTH 2 DAYS  Final   Report Status 09/07/2015 FINAL  Final    RADIOLOGY:  No results found.    Management plans discussed with the patient, family and they are in agreement.  CODE STATUS:     Code Status Orders        Start     Ordered   09/30/15 1423  Full code   Continuous     09/30/15 1423    Advance Directive Documentation        Most Recent Value   Type of Advance Directive  Living will   Pre-existing out of facility DNR order (yellow form or pink MOST form)     "MOST" Form in Place?        TOTAL TIME TAKING CARE OF THIS PATIENT: 40 minutes.    Henreitta Leber M.D on 10/07/2015 at 5:04 PM  Between 7am to 6pm - Pager - [phone]  After 6pm go to [email or web address] - password [email or web address] [facility]  [facility]  Office  [phone]  CC: Primary care physician; Adrian Prows, MD

## 2015-10-07 NOTE — Progress Notes (Signed)
Pt is from home where she lives alone.  She is possibly being discharged to day and it was recommended that she go to rehab.  She is refusing rehab, but has found someone to come in her home and take care of her. She receives Norco for shoulder pain and received it twice last night.

## 2015-10-07 NOTE — Discharge Instructions (Signed)
[facility] appointment on October 22, 2015 at 1:00pm with Darylene Price, [place]. Please call [phone] to reschedule.

## 2015-10-07 NOTE — Clinical Social Work Note (Signed)
CSW met with pt to address consult for New SNF placement for STR. Pt declined STR at SNF and reported that she had a friend that could stay with her. Per RNCM, pt did not have 24 hour supervision as of yesterday. CSW update MD and RNCM regarding discharge plan. CSW will sign off as no further needs identified, however is available if a need arises prior to discharge.   Carolyn Valdez, MSW, LCSW Clinical Social Worker  [phone]

## 2015-10-07 NOTE — Care Management (Signed)
Patient now tells me that "her friend will provide 24/7 care in the home" and that the same friend will provide transportation to home today. I am not able to obtain a bariatric rollator per Will with [facility] and patient doesn't believe a regular size rollator will fit her. She denied need for wheelchair as she stated she "does not have enough room in the house". Corene Cornea with [facility] notified of patient discharge to home today. O2 assessment needed to meet medical necessity for home O2 as it has expired. I have updated Will with [facility] and I have notified Dr. Verdell Carmine of this need prior to discharge to home today.

## 2015-10-16 ENCOUNTER — Telehealth: Payer: Self-pay | Admitting: Surgery

## 2015-10-16 NOTE — Telephone Encounter (Signed)
Patient called and wants [place] to make an appointment for a mammogram. Dr. Rolin Barry patient.

## 2015-10-17 ENCOUNTER — Other Ambulatory Visit: Payer: Self-pay | Admitting: Surgery

## 2015-10-17 DIAGNOSIS — Z1231 Encounter for screening mammogram for malignant neoplasm of breast: Secondary | ICD-10-CM

## 2015-10-17 NOTE — Telephone Encounter (Signed)
[facility] and set up her screening mammogram. Patient's appointment will be on 11/06/2015 at 1:00 PM. I then called patient to let her know when her appointment would be. Patient requested for me to send her the appointment information by mail. So, I will mail her the information.

## 2015-10-22 ENCOUNTER — Ambulatory Visit: Payer: Medicare Other | Admitting: Family

## 2015-11-03 ENCOUNTER — Ambulatory Visit: Payer: Medicare Other | Admitting: Family

## 2015-11-04 ENCOUNTER — Ambulatory Visit: Payer: Medicare Other | Admitting: Urology

## 2015-11-05 ENCOUNTER — Encounter: Payer: Self-pay | Admitting: Urology

## 2015-11-05 ENCOUNTER — Ambulatory Visit (INDEPENDENT_AMBULATORY_CARE_PROVIDER_SITE_OTHER): Payer: Medicare Other | Admitting: Urology

## 2015-11-05 VITALS — BP 167/79 | HR 64 | Ht 67.5 in | Wt 292.1 lb

## 2015-11-05 DIAGNOSIS — R350 Frequency of micturition: Secondary | ICD-10-CM | POA: Diagnosis not present

## 2015-11-05 DIAGNOSIS — N390 Urinary tract infection, site not specified: Secondary | ICD-10-CM | POA: Diagnosis not present

## 2015-11-05 DIAGNOSIS — R351 Nocturia: Secondary | ICD-10-CM

## 2015-11-05 LAB — URINALYSIS, COMPLETE
Bilirubin, UA: NEGATIVE
KETONES UA: NEGATIVE
NITRITE UA: NEGATIVE
SPEC GRAV UA: 1.02 (ref 1.005–1.030)
UUROB: 0.2 mg/dL (ref 0.2–1.0)
pH, UA: 5.5 (ref 5.0–7.5)

## 2015-11-05 LAB — MICROSCOPIC EXAMINATION: RBC MICROSCOPIC, UA: NONE SEEN /HPF (ref 0–?)

## 2015-11-05 MED ORDER — VESICARE 10 MG PO TABS: 10.0000 mg | ORAL_TABLET | Freq: Every day | ORAL | Status: DC

## 2015-11-05 MED ORDER — MIRABEGRON ER 50 MG PO TB24: 50.0000 mg | ORAL_TABLET | Freq: Every evening | ORAL | Status: DC

## 2015-11-05 MED ORDER — AMOXICILLIN-POT CLAVULANATE 500-125 MG PO TABS: 1.0000 | ORAL_TABLET | Freq: Three times a day (TID) | ORAL | Status: DC

## 2015-11-05 MED ORDER — DESMOPRESSIN ACETATE 0.2 MG PO TABS: 0.2000 mg | ORAL_TABLET | Freq: Every day | ORAL | Status: DC

## 2015-11-05 NOTE — Progress Notes (Signed)
11/05/2015 1:22 PM   Carolyn Valdez Apr 09, 1941 [id number]  Referring provider: Adrian Prows, MD [place], [place] [address]  Chief Complaint  Patient presents with  . Urinary Tract Infection    recurrent    HPI: Patient is a 75 year old Caucasian female with a history of recurrent UTI's who presents today with the complaint of dysuria that has been occuring for one week.    Her UTI's have been multiple organisms with increasing resistance to antibiotics.  She had been evaluated by ID as well.    She is unsure if the dysuria is occuring internally or externally.  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  Her situation is complicated by her obesity, Lasix and incontinence.  She is continually in depends.  She is on Vesicare 10 mg, Myrbetriq 50 mg and DDAVP 0.2 mg nightly.  She has been tried on PTNS and did not find it effective.    She has been evaluated by [facility], [facility] and now [place].  She had a cystoscopic exam in 2015 with Dr. Bernardo Heater, which was negative.  It was recommended that she undergo UDS, but she cannot find transportation to an outside facility.  She has had a CT in 2015 and RUS in 2016 which have demonstrated any GU pathology.    She is using an applicator to insert vaginal estrogen cream once weekly.    She denies suprapubic pain or hematuria.  She has not had recent fevers, chills, nausea or vomiting.  Her cath UA was positive for WBC's and bacteria.  It was negative for nitrites.    PMH: Past Medical History  Diagnosis Date  . Diabetes mellitus without complication ([place])   . Breast cancer ([place])     16 yrs ago  . Hypercholesteremia   . DJD (degenerative joint disease)   . SVT (supraventricular tachycardia) ([place])   . Kidney stone   . H/O total knee replacement     right  . Dysuria   . HTN (hypertension)   . CHF (congestive heart failure) ([place])   . Anxiety   . Yeast vaginitis   . Vaginal  atrophy   . Recurrent urinary tract infection   . Obesity   . Urinary incontinence     Surgical History: Past Surgical History  Procedure Laterality Date  . Cholecystectomy    . Tonsillectomy    . Mastectomy Right   . Vocal cords    . Foot surgery    . Cardiac catheterization    . Nasal septum surgery    . Joint replacement    . Peripheral vascular catheterization N/A 07/08/2015    Procedure: PICC Line Insertion;  Surgeon: Algernon Huxley, MD;  Location: [facility];  Service: Cardiovascular;  Laterality: N/A;    Home Medications:    Medication List       This list is accurate as of: 11/05/15  1:22 PM.  Always use your most recent med list.               amLODipine 5 MG tablet  Commonly known as:  NORVASC  Take 5 mg by mouth daily.     amoxicillin-clavulanate 500-125 MG tablet  Commonly known as:  AUGMENTIN  Take 1 tablet (500 mg total) by mouth 3 (three) times daily.     atorvastatin 20 MG tablet  Commonly known as:  LIPITOR  Take 20 mg by mouth at bedtime.  benzonatate 100 MG capsule  Commonly known as:  TESSALON  Take 1 capsule (100 mg total) by mouth 3 (three) times daily as needed for cough.     clonazePAM 1 MG tablet  Commonly known as:  KLONOPIN  Take 1 mg by mouth 3 (three) times daily.     conjugated estrogens vaginal cream  Commonly known as:  PREMARIN  Place 1 Applicatorful vaginally 3 (three) times a week. Pt uses on Monday, Wednesday, and Friday.     desmopressin 0.2 MG tablet  Commonly known as:  DDAVP  Take 1 tablet (0.2 mg total) by mouth at bedtime.     docusate sodium 100 MG capsule  Commonly known as:  COLACE  Take 200 mg by mouth 2 (two) times daily.     fexofenadine 180 MG tablet  Commonly known as:  ALLEGRA  Take 180 mg by mouth daily.     furosemide 40 MG tablet  Commonly known as:  LASIX  Take 1 tablet (40 mg total) by mouth daily.     gabapentin 300 MG capsule  Commonly known as:  NEURONTIN  Take 300-600 mg by  mouth 3 (three) times daily. Pt takes two capsules in the morning, one capsule at lunch, and two capsules in the evening.     hydrOXYzine 25 MG tablet  Commonly known as:  ATARAX/VISTARIL  Take 25 mg by mouth at bedtime.     insulin glargine 100 UNIT/ML injection  Commonly known as:  LANTUS  Inject 50 Units into the skin at bedtime.     iron polysaccharides 150 MG capsule  Commonly known as:  NIFEREX  Take 150 mg by mouth daily.     losartan 100 MG tablet  Commonly known as:  COZAAR  Take 100 mg by mouth daily.     metFORMIN 1000 MG tablet  Commonly known as:  GLUCOPHAGE     metoprolol succinate 100 MG 24 hr tablet  Commonly known as:  TOPROL-XL  Take 100 mg by mouth 2 (two) times daily.     mirabegron ER 50 MG Tb24 tablet  Commonly known as:  MYRBETRIQ  Take 1 tablet (50 mg total) by mouth every evening.     omeprazole 40 MG capsule  Commonly known as:  PRILOSEC  Take 40 mg by mouth 2 (two) times daily.     pioglitazone 30 MG tablet  Commonly known as:  ACTOS  Take 30 mg by mouth daily.     raloxifene 60 MG tablet  Commonly known as:  EVISTA  Take 60 mg by mouth at bedtime.     VESICARE 10 MG tablet  Generic drug:  solifenacin  Take 1 tablet (10 mg total) by mouth daily.     vitamin E 1000 UNIT capsule  Take 1,000 Units by mouth daily.        Allergies:  Allergies  Allergen Reactions  . Sulfa Antibiotics Hives  . Biaxin [Clarithromycin] Hives  . Eggs Or Egg-Derived Products Other (See Comments)    Pt states that her allergy test said she was allergic to eggs.    . Influenza A (H1n1) Monoval Vac Other (See Comments)    Pt states that she was told by her MD not to get the influenza vaccine.    . Morphine Other (See Comments)    Reaction:  Dizziness and confusion   . Pyridium [Phenazopyridine Hcl] Other (See Comments)    Reaction:  Unknown   . Ceftriaxone Anxiety  . Latex Rash  .  Prednisone Rash  . Tape Rash    Family History: Family History    Problem Relation Age of Onset  . Lung cancer Father   . Hematuria Mother   . Lung cancer Mother   . Kidney disease Neg Hx   . Bladder Cancer Neg Hx     Social History:  reports that she has quit smoking. She quit smokeless tobacco use about 20 years ago. She reports that she does not drink alcohol or use illicit drugs.  ROS: UROLOGY Frequent Urination?: Yes Hard to postpone urination?: Yes Burning/pain with urination?: Yes Get up at night to urinate?: Yes Leakage of urine?: Yes Urine stream starts and stops?: No Trouble starting stream?: Yes Do you have to strain to urinate?: No Blood in urine?: No Urinary tract infection?: Yes Sexually transmitted disease?: No Injury to kidneys or bladder?: No Painful intercourse?: No Weak stream?: No Currently pregnant?: No Vaginal bleeding?: No Last menstrual period?: n  Gastrointestinal Nausea?: Yes Vomiting?: No Indigestion/heartburn?: No Diarrhea?: No Constipation?: Yes  Constitutional Fever: No Night sweats?: No Weight loss?: No Fatigue?: Yes  Skin Skin rash/lesions?: No Itching?: No  Eyes Blurred vision?: No Double vision?: No  Ears/Nose/Throat Sore throat?: Yes Sinus problems?: Yes  Hematologic/Lymphatic Swollen glands?: No Easy bruising?: No  Cardiovascular Leg swelling?: Yes Chest pain?: No  Respiratory Cough?: No Shortness of breath?: No  Endocrine Excessive thirst?: No  Musculoskeletal Back pain?: Yes Joint pain?: No  Neurological Headaches?: Yes Dizziness?: Yes  Psychologic Depression?: No Anxiety?: Yes  Physical Exam: BP 167/79 mmHg  Pulse 64  Ht 5' 7.5" (1.715 m)  Wt 292 lb 1.6 oz (132.496 kg)  BMI 45.05 kg/m2  Constitutional: Well nourished. Alert and oriented, No acute distress. HEENT: [place] AT, moist mucus membranes. Trachea midline, no masses. Cardiovascular: No clubbing, cyanosis, or edema. Respiratory: Normal respiratory effort, no increased work of breathing. GI:  Abdomen is soft, non tender, non distended, no abdominal masses. Liver and spleen not palpable.  No hernias appreciated.  Stool sample for occult testing is not indicated.   GU: No CVA tenderness.  No bladder fullness or masses.  Atrophic external genitalia, normal pubic hair distribution, no lesions.  Normal urethral meatus, no lesions, no prolapse, no discharge.   No urethral masses, tenderness and/or tenderness.  I could not conduct a bimanual exam due to patient's obesity and extreme tenderness.   Anus and perineum are without rashes or lesions.    Skin: No rashes, bruises or suspicious lesions. Lymph: No cervical or inguinal adenopathy. Neurologic: Grossly intact, no focal deficits, moving all 4 extremities. Psychiatric: Normal mood and affect.  Laboratory Data: Lab Results  Component Value Date   WBC 6.0 10/04/2015   HGB 10.0* 10/04/2015   HCT 31.0* 10/04/2015   MCV 86.6 10/04/2015   PLT 189 10/04/2015   Lab Results  Component Value Date   CREATININE 1.58* 10/07/2015   Lab Results  Component Value Date   HGBA1C 6.2 09/04/2014   Lab Results  Component Value Date   TSH 1.61 07/24/2013   Lab Results  Component Value Date   AST 26 05/22/2014   Lab Results  Component Value Date   ALT 22 05/22/2014    Urinalysis Results for orders placed or performed in visit on 11/05/15  Microscopic Examination  Result Value Ref Range   WBC, UA >30 (H) 0 -  5 /hpf   RBC, UA None seen 0 -  2 /hpf   Epithelial Cells (non renal) 0-10 0 - 10 /  hpf   Bacteria, UA Moderate (A) None seen/Few  Urinalysis, Complete  Result Value Ref Range   Specific Gravity, UA 1.020 1.005 - 1.030   pH, UA 5.5 5.0 - 7.5   Color, UA Yellow Yellow   Appearance Ur Cloudy (A) Clear   Leukocytes, UA 1+ (A) Negative   Protein, UA 3+ (A) Negative/Trace   Glucose, UA Trace (A) Negative   Ketones, UA Negative Negative   RBC, UA Trace (A) Negative   Bilirubin, UA Negative Negative   Urobilinogen, Ur 0.2 0.2 -  1.0 mg/dL   Nitrite, UA Negative Negative   Microscopic Examination See below:    In and Out Catheterization  Patient is present today for a I & O catheterization due to recurrent. Patient was cleaned and prepped in a sterile fashion with betadine and Lidocaine 2% jelly was instilled into the urethra.  A 14 FR cath was inserted no complications were noted , 50 ml of urine return was noted, urine was yellow, cloudy in color. A clean urine sample was collected for culture. Bladder was drained  And catheter was removed with out difficulty.    Preformed by: Marella Chimes, PA-C  Follow up/ Additional notes: Patient's external vagina is atrophic and she complained of severe pain when we pulled the labia apart to obtain access to the meatus.  Because of her size, I am not confident that this is a sterile specimen even though it was a CATH specimen.    Assessment & Plan:    1. Recurrent UTI:    I expressed my desire to wait for the culture and sensitivity results before prescribing an antibiotic because of her history of recurrent UTI's.  She stated she is fearful that her symptoms will worsen and would like an antibiotic on hand.  I have sent a prescription to her pharmacy for Augmentin 500/125.  We will adjust her antibiotic if appropriate.  We will also add the fosfomycin sensitivities to the culture.    - CULTURE, URINE COMPREHENSIVE - Urinalysis, Complete  2. Nocturia:   Patient is currently on DDAVP 0.2 mg nightly.  This is providing minimal relief.  She is still experiencing nocturia.  Her most recent sodium level was 136 mmol/L on 10/04/2015.    3. Frequency:   Patient is on maximal medical therapy with Vesicare 10 mg daily and Myrbetriq 50 mg daily.  Her PVR today was 50 cc.  She will continue these medications.   Return for pending labs.  These notes generated with voice recognition software. I apologize for typographical errors.  Zara Council, [facility] [address] [place], [place] [address] [phone]

## 2015-11-06 ENCOUNTER — Ambulatory Visit
Admission: RE | Admit: 2015-11-06 | Discharge: 2015-11-06 | Disposition: A | Discharge status: Home / Self Care | Payer: Medicare Other | Source: Ambulatory Visit | Attending: Surgery | Admitting: Surgery

## 2015-11-06 ENCOUNTER — Other Ambulatory Visit: Payer: Self-pay | Admitting: Surgery

## 2015-11-06 DIAGNOSIS — Z1231 Encounter for screening mammogram for malignant neoplasm of breast: Secondary | ICD-10-CM

## 2015-11-06 DIAGNOSIS — Z9011 Acquired absence of right breast and nipple: Secondary | ICD-10-CM | POA: Diagnosis not present

## 2015-11-09 LAB — CULTURE, URINE COMPREHENSIVE

## 2015-11-10 ENCOUNTER — Telehealth: Payer: Self-pay

## 2015-11-10 DIAGNOSIS — N390 Urinary tract infection, site not specified: Secondary | ICD-10-CM

## 2015-11-10 MED ORDER — NITROFURANTOIN MONOHYD MACRO 100 MG PO CAPS: 100.0000 mg | ORAL_CAPSULE | Freq: Two times a day (BID) | ORAL | Status: AC

## 2015-11-10 NOTE — Telephone Encounter (Signed)
Actually nitrofurantoin is only to be given for 5 days, so she is receiving a longer course already.

## 2015-11-10 NOTE — Telephone Encounter (Signed)
Spoke with pt in reference to macrobid. Pt voiced understanding.

## 2015-11-10 NOTE — Telephone Encounter (Signed)
-----   Message from Nori Riis, PA-C sent at 11/09/2015  9:54 PM EST ----- Patient needs to stop her Augmentin and start Nitrofurantoin 100 mg one capsule twice daily for seven days.  Then she needs a CATH specimen 3 to 5 days after completing her antibiotics.

## 2015-11-10 NOTE — Telephone Encounter (Signed)
Spoke with pt in reference to +ucx. Pt voiced understanding. Pt stated that she does not think 7 days is long enough to clear up her UTI. Please advise.

## 2015-11-11 ENCOUNTER — Telehealth: Payer: Self-pay

## 2015-11-11 NOTE — Telephone Encounter (Signed)
Please call patient and schedule follow-up from yearly mammogram with Dr. Azalee Course.

## 2015-11-11 NOTE — Telephone Encounter (Signed)
Spoke with [name], from Dr. Blane Ohara office, in reference to pt UTI and maintenance macrobid. Per [place] after pt completes treatment dose of macrobid she should start daily macrobid.  Spoke with pt and made aware of Dr. Einar Pheasant orders. Pt voiced understanding.

## 2015-11-12 NOTE — Telephone Encounter (Signed)
Called patient to schedule her follow up appointment with Dr Azalee Course after her mammogram. No answer, left message for her to call the office to schedule same.

## 2015-11-13 NOTE — Telephone Encounter (Signed)
Follow-up for mammogram scheduled 2/9 with Dr. Azalee Course per [place].

## 2015-11-21 ENCOUNTER — Ambulatory Visit: Payer: Medicare Other | Admitting: Urology

## 2015-11-21 ENCOUNTER — Ambulatory Visit: Payer: Medicare Other | Admitting: Obstetrics and Gynecology

## 2015-11-21 ENCOUNTER — Ambulatory Visit: Payer: Self-pay

## 2015-11-24 ENCOUNTER — Encounter: Payer: Self-pay | Admitting: Urology

## 2015-11-24 ENCOUNTER — Ambulatory Visit (INDEPENDENT_AMBULATORY_CARE_PROVIDER_SITE_OTHER): Payer: Medicare Other | Admitting: Urology

## 2015-11-24 VITALS — BP 154/72 | HR 59 | Ht 67.5 in | Wt 306.5 lb

## 2015-11-24 DIAGNOSIS — N390 Urinary tract infection, site not specified: Secondary | ICD-10-CM | POA: Diagnosis not present

## 2015-11-24 LAB — URINALYSIS, COMPLETE
BILIRUBIN UA: NEGATIVE
GLUCOSE, UA: NEGATIVE
Ketones, UA: NEGATIVE
LEUKOCYTES UA: NEGATIVE
Nitrite, UA: NEGATIVE
PH UA: 5 (ref 5.0–7.5)
Specific Gravity, UA: 1.015 (ref 1.005–1.030)
Urobilinogen, Ur: 0.2 mg/dL (ref 0.2–1.0)

## 2015-11-24 LAB — MICROSCOPIC EXAMINATION: RBC MICROSCOPIC, UA: NONE SEEN /HPF (ref 0–?)

## 2015-11-24 NOTE — Progress Notes (Signed)
2:32 PM   Carolyn Valdez [birth year] [id number]  Referring provider: Adrian Prows, MD [place], [place] [address]  Chief Complaint  Patient presents with  . Recurrent UTI    Cath specimen    HPI: Patient is a 75 year old Caucasian female with a history of recurrent UTI's who presents today for a CATH specimen after completing nitrofuratoin for a staphylococcus aureus UTI.    Her UTI's have been multiple organisms with increasing resistance to antibiotics.  She had been evaluated by ID as well.    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  Her situation is complicated by her obesity, Lasix and incontinence.  She is continually in depends.  She is on Vesicare 10 mg, Myrbetriq 50 mg and DDAVP 0.2 mg nightly.  She has been tried on PTNS and did not find it effective.    She has been evaluated by [facility], [facility] and now [place].  She had a cystoscopic exam in 2015 with Dr. Bernardo Heater, which was negative.  It was recommended that she undergo UDS, but she cannot find transportation to an outside facility.  She has had a CT in 2015 and RUS in 2016 which have demonstrated any GU pathology.    She is using an applicator to insert vaginal estrogen cream once weekly.    She denies suprapubic pain or hematuria.  She has not had recent fevers, chills, nausea or vomiting.  Her cath UA was positive for many  bacteria.  It was negative for nitrites.    PMH: Past Medical History  Diagnosis Date  . Diabetes mellitus without complication ([place])   . Breast cancer ([place])     16 yrs ago  . Hypercholesteremia   . DJD (degenerative joint disease)   . SVT (supraventricular tachycardia) ([place])   . Kidney stone   . H/O total knee replacement     right  . Dysuria   . HTN (hypertension)   . CHF (congestive heart failure) ([place])   . Anxiety   . Yeast vaginitis   . Vaginal atrophy   . Recurrent urinary tract infection   . Obesity   . Urinary incontinence     Surgical  History: Past Surgical History  Procedure Laterality Date  . Cholecystectomy    . Tonsillectomy    . Vocal cords    . Foot surgery    . Cardiac catheterization    . Nasal septum surgery    . Joint replacement    . Peripheral vascular catheterization N/A 07/08/2015    Procedure: PICC Line Insertion;  Surgeon: Algernon Huxley, MD;  Location: [facility];  Service: Cardiovascular;  Laterality: N/A;  . Mastectomy Right 1997    Home Medications:    Medication List       This list is accurate as of: 11/24/15  2:32 PM.  Always use your most recent med list.               amLODipine 5 MG tablet  Commonly known as:  NORVASC  Take 5 mg by mouth daily.     amoxicillin-clavulanate 500-125 MG tablet  Commonly known as:  AUGMENTIN  Take 1 tablet (500 mg total) by mouth 3 (three) times daily.     atorvastatin 20 MG tablet  Commonly known as:  LIPITOR  Take 20 mg by mouth at bedtime.     benzonatate 100 MG capsule  Commonly known as:  TESSALON  Take 1 capsule (100  mg total) by mouth 3 (three) times daily as needed for cough.     clonazePAM 1 MG tablet  Commonly known as:  KLONOPIN  Take 1 mg by mouth 3 (three) times daily.     conjugated estrogens vaginal cream  Commonly known as:  PREMARIN  Place 1 Applicatorful vaginally 3 (three) times a week. Pt uses on Monday, Wednesday, and Friday.     desmopressin 0.2 MG tablet  Commonly known as:  DDAVP  Take 1 tablet (0.2 mg total) by mouth at bedtime.     docusate sodium 100 MG capsule  Commonly known as:  COLACE  Take 200 mg by mouth 2 (two) times daily.     fexofenadine 180 MG tablet  Commonly known as:  ALLEGRA  Take 180 mg by mouth daily.     furosemide 40 MG tablet  Commonly known as:  LASIX  Take 1 tablet (40 mg total) by mouth daily.     gabapentin 300 MG capsule  Commonly known as:  NEURONTIN  Take 300-600 mg by mouth 3 (three) times daily. Pt takes two capsules in the morning, one capsule at lunch, and two  capsules in the evening.     hydrOXYzine 25 MG tablet  Commonly known as:  ATARAX/VISTARIL  Take 25 mg by mouth at bedtime.     insulin glargine 100 UNIT/ML injection  Commonly known as:  LANTUS  Inject 50 Units into the skin at bedtime.     iron polysaccharides 150 MG capsule  Commonly known as:  NIFEREX  Take 150 mg by mouth daily.     losartan 100 MG tablet  Commonly known as:  COZAAR  Take 100 mg by mouth daily.     metFORMIN 1000 MG tablet  Commonly known as:  GLUCOPHAGE     metoprolol succinate 100 MG 24 hr tablet  Commonly known as:  TOPROL-XL  Take 100 mg by mouth 2 (two) times daily.     mirabegron ER 50 MG Tb24 tablet  Commonly known as:  MYRBETRIQ  Take 1 tablet (50 mg total) by mouth every evening.     omeprazole 40 MG capsule  Commonly known as:  PRILOSEC  Take 40 mg by mouth 2 (two) times daily.     pioglitazone 30 MG tablet  Commonly known as:  ACTOS  Take 30 mg by mouth daily.     raloxifene 60 MG tablet  Commonly known as:  EVISTA  Take 60 mg by mouth at bedtime.     VESICARE 10 MG tablet  Generic drug:  solifenacin  Take 1 tablet (10 mg total) by mouth daily.     vitamin E 1000 UNIT capsule  Take 1,000 Units by mouth daily.        Allergies:  Allergies  Allergen Reactions  . Sulfa Antibiotics Hives  . Biaxin [Clarithromycin] Hives  . Eggs Or Egg-Derived Products Other (See Comments)    Pt states that her allergy test said she was allergic to eggs.    . Influenza A (H1n1) Monoval Vac Other (See Comments)    Pt states that she was told by her MD not to get the influenza vaccine.    . Morphine Other (See Comments)    Reaction:  Dizziness and confusion   . Pyridium [Phenazopyridine Hcl] Other (See Comments)    Reaction:  Unknown   . Ceftriaxone Anxiety  . Latex Rash  . Prednisone Rash  . Tape Rash    Family History: Family History  Problem Relation Age of Onset  . Lung cancer Father   . Hematuria Mother   . Lung cancer Mother    . Kidney disease Neg Hx   . Bladder Cancer Neg Hx   . Breast cancer Neg Hx     Social History:  reports that she has quit smoking. She quit smokeless tobacco use about 20 years ago. She reports that she does not drink alcohol or use illicit drugs.  ROS: UROLOGY Frequent Urination?: Yes Hard to postpone urination?: Yes Burning/pain with urination?: Yes Get up at night to urinate?: Yes Leakage of urine?: Yes Urine stream starts and stops?: No Trouble starting stream?: Yes Do you have to strain to urinate?: No Blood in urine?: No Urinary tract infection?: Yes Sexually transmitted disease?: No Injury to kidneys or bladder?: No Painful intercourse?: No Weak stream?: No Currently pregnant?: No Vaginal bleeding?: No Last menstrual period?: n  Gastrointestinal Nausea?: Yes Vomiting?: No Indigestion/heartburn?: No Diarrhea?: No Constipation?: Yes  Constitutional Fever: No Night sweats?: No Weight loss?: No Fatigue?: Yes  Skin Skin rash/lesions?: No Itching?: No  Eyes Blurred vision?: No Double vision?: No  Ears/Nose/Throat Sore throat?: Yes Sinus problems?: Yes  Hematologic/Lymphatic Swollen glands?: No Easy bruising?: No  Cardiovascular Leg swelling?: Yes Chest pain?: No  Respiratory Cough?: No Shortness of breath?: No  Endocrine Excessive thirst?: No  Musculoskeletal Back pain?: Yes Joint pain?: No  Neurological Headaches?: Yes Dizziness?: Yes  Psychologic Depression?: No Anxiety?: Yes   Physical Exam: BP 154/72 mmHg  Pulse 59  Ht 5' 7.5" (1.715 m)  Wt 306 lb 8 oz (139.027 kg)  BMI 47.27 kg/m2  Constitutional: Well nourished. Alert and oriented, No acute distress. HEENT: [place] AT, moist mucus membranes. Trachea midline, no masses. Cardiovascular: No clubbing, cyanosis, or edema. Respiratory: Normal respiratory effort, no increased work of breathing. GI: Abdomen is soft, non tender, non distended, no abdominal masses. Liver and spleen  not palpable.  No hernias appreciated.  Stool sample for occult testing is not indicated.   GU: No CVA tenderness.  No bladder fullness or masses.  Atrophic external genitalia, normal pubic hair distribution, no lesions.  Normal urethral meatus, no lesions, no prolapse, no discharge.   No urethral masses, tenderness and/or tenderness.  I could not conduct a bimanual exam due to patient's obesity and extreme tenderness.   Anus and perineum are without rashes or lesions.    Skin: No rashes, bruises or suspicious lesions. Lymph: No cervical or inguinal adenopathy. Neurologic: Grossly intact, no focal deficits, moving all 4 extremities. Psychiatric: Normal mood and affect.  Laboratory Data: Lab Results  Component Value Date   WBC 6.0 10/04/2015   HGB 10.0* 10/04/2015   HCT 31.0* 10/04/2015   MCV 86.6 10/04/2015   PLT 189 10/04/2015   Lab Results  Component Value Date   CREATININE 1.58* 10/07/2015   Lab Results  Component Value Date   HGBA1C 6.2 09/04/2014   Lab Results  Component Value Date   TSH 1.61 07/24/2013   Lab Results  Component Value Date   AST 26 05/22/2014   Lab Results  Component Value Date   ALT 22 05/22/2014    Urinalysis Results for orders placed or performed in visit on 11/24/15  Microscopic Examination  Result Value Ref Range   WBC, UA 0-5 0 -  5 /hpf   RBC, UA None seen 0 -  2 /hpf   Epithelial Cells (non renal) 0-10 0 - 10 /hpf   Bacteria, UA Many (A) None  seen/Few  Urinalysis, Complete  Result Value Ref Range   Specific Gravity, UA 1.015 1.005 - 1.030   pH, UA 5.0 5.0 - 7.5   Color, UA Yellow Yellow   Appearance Ur Cloudy (A) Clear   Leukocytes, UA Negative Negative   Protein, UA 2+ (A) Negative/Trace   Glucose, UA Negative Negative   Ketones, UA Negative Negative   RBC, UA Trace (A) Negative   Bilirubin, UA Negative Negative   Urobilinogen, Ur 0.2 0.2 - 1.0 mg/dL   Nitrite, UA Negative Negative   Microscopic Examination See below:    In  and Out Catheterization  Patient is present today for a I & O catheterization due to recurrent. Patient was cleaned and prepped in a sterile fashion with betadine and Lidocaine 2% jelly was instilled into the urethra.  A 14 FR cath was inserted no complications were noted , 50 ml of urine return was noted, urine was yellow, cloudy in color. A clean urine sample was collected for culture. Bladder was drained  And catheter was removed with out difficulty.    Preformed by: Marella Chimes, PA-C   Assessment & Plan:    1. Recurrent UTI:  Patient is not having symptoms at this time.  She did have many bacteria.  I will culture, but will not treat unless she develops symptoms.    - CULTURE, URINE COMPREHENSIVE - Urinalysis, Complete  2. Nocturia:   Patient is currently on DDAVP 0.2 mg nightly.  This is providing minimal relief.  She is still experiencing nocturia.  Her most recent sodium level was 136 mmol/L on 10/04/2015.    3. Frequency:   Patient is on maximal medical therapy with Vesicare 10 mg daily and Myrbetriq 50 mg daily.  Her PVR today was 50 cc.  She will continue these medications.   No Follow-up on file.  These notes generated with voice recognition software. I apologize for typographical errors.  Zara Council, [facility] [address] [place], [place] [address] [phone]

## 2015-11-25 ENCOUNTER — Encounter: Payer: Self-pay | Admitting: Surgery

## 2015-11-25 DIAGNOSIS — I509 Heart failure, unspecified: Secondary | ICD-10-CM | POA: Insufficient documentation

## 2015-11-26 LAB — CULTURE, URINE COMPREHENSIVE

## 2015-11-27 ENCOUNTER — Telehealth: Payer: Self-pay

## 2015-11-27 ENCOUNTER — Ambulatory Visit: Payer: Medicare Other | Admitting: Surgery

## 2015-11-27 NOTE — Telephone Encounter (Signed)
-----   Message from Nori Riis, PA-C sent at 11/26/2015 10:33 PM EST ----- Please let Mrs. Stelly know her urine culture is negative.

## 2015-11-27 NOTE — Telephone Encounter (Signed)
Spoke with pt in reference to -ucx. Pt voiced understanding.  

## 2015-12-24 ENCOUNTER — Encounter: Payer: Self-pay | Admitting: Emergency Medicine

## 2015-12-24 ENCOUNTER — Emergency Department
Admission: EM | Admit: 2015-12-24 | Discharge: 2015-12-24 | Disposition: A | Discharge status: Home / Self Care | Payer: Medicare Other | Attending: Emergency Medicine | Admitting: Emergency Medicine

## 2015-12-24 ENCOUNTER — Emergency Department: Payer: Medicare Other

## 2015-12-24 DIAGNOSIS — E669 Obesity, unspecified: Secondary | ICD-10-CM | POA: Diagnosis not present

## 2015-12-24 DIAGNOSIS — E162 Hypoglycemia, unspecified: Secondary | ICD-10-CM

## 2015-12-24 DIAGNOSIS — N3020 Other chronic cystitis without hematuria: Secondary | ICD-10-CM | POA: Insufficient documentation

## 2015-12-24 DIAGNOSIS — Z91040 Latex allergy status: Secondary | ICD-10-CM | POA: Insufficient documentation

## 2015-12-24 DIAGNOSIS — Z794 Long term (current) use of insulin: Secondary | ICD-10-CM | POA: Insufficient documentation

## 2015-12-24 DIAGNOSIS — Z79899 Other long term (current) drug therapy: Secondary | ICD-10-CM | POA: Diagnosis not present

## 2015-12-24 DIAGNOSIS — Z7984 Long term (current) use of oral hypoglycemic drugs: Secondary | ICD-10-CM | POA: Insufficient documentation

## 2015-12-24 DIAGNOSIS — I10 Essential (primary) hypertension: Secondary | ICD-10-CM | POA: Diagnosis not present

## 2015-12-24 DIAGNOSIS — E11649 Type 2 diabetes mellitus with hypoglycemia without coma: Secondary | ICD-10-CM | POA: Diagnosis not present

## 2015-12-24 DIAGNOSIS — Z87891 Personal history of nicotine dependence: Secondary | ICD-10-CM | POA: Insufficient documentation

## 2015-12-24 LAB — COMPREHENSIVE METABOLIC PANEL
ALBUMIN: 4.1 g/dL (ref 3.5–5.0)
ALK PHOS: 66 U/L (ref 38–126)
ALT: 12 U/L — AB (ref 14–54)
AST: 20 U/L (ref 15–41)
Anion gap: 7 (ref 5–15)
BILIRUBIN TOTAL: 0.6 mg/dL (ref 0.3–1.2)
BUN: 44 mg/dL — ABNORMAL HIGH (ref 6–20)
CALCIUM: 9.6 mg/dL (ref 8.9–10.3)
CO2: 27 mmol/L (ref 22–32)
CREATININE: 1.43 mg/dL — AB (ref 0.44–1.00)
Chloride: 106 mmol/L (ref 101–111)
GFR calc non Af Amer: 36 mL/min — ABNORMAL LOW (ref >60)
GFR, EST AFRICAN AMERICAN: 41 mL/min — AB (ref >60)
GLUCOSE: 88 mg/dL (ref 65–99)
Potassium: 4.4 mmol/L (ref 3.5–5.1)
SODIUM: 140 mmol/L (ref 135–145)
TOTAL PROTEIN: 7.6 g/dL (ref 6.5–8.1)

## 2015-12-24 LAB — URINALYSIS COMPLETE WITH MICROSCOPIC (ARMC ONLY)
Bilirubin Urine: NEGATIVE
Glucose, UA: NEGATIVE mg/dL
Hgb urine dipstick: NEGATIVE
Ketones, ur: NEGATIVE mg/dL
Nitrite: NEGATIVE
PH: 5 (ref 5.0–8.0)
PROTEIN: 100 mg/dL — AB
Specific Gravity, Urine: 1.021 (ref 1.005–1.030)

## 2015-12-24 LAB — CBC WITH DIFFERENTIAL/PLATELET
Basophils Absolute: 0.1 10*3/uL (ref 0–0.1)
Basophils Relative: 1 %
Eosinophils Absolute: 0.6 10*3/uL (ref 0–0.7)
Eosinophils Relative: 6 %
HEMATOCRIT: 30.7 % — AB (ref 35.0–47.0)
Hemoglobin: 10.4 g/dL — ABNORMAL LOW (ref 12.0–16.0)
LYMPHS ABS: 1.7 10*3/uL (ref 1.0–3.6)
LYMPHS PCT: 20 %
MCH: 30 pg (ref 26.0–34.0)
MCHC: 33.8 g/dL (ref 32.0–36.0)
MCV: 88.7 fL (ref 80.0–100.0)
MONO ABS: 0.9 10*3/uL (ref 0.2–0.9)
MONOS PCT: 10 %
NEUTROS ABS: 5.5 10*3/uL (ref 1.4–6.5)
Neutrophils Relative %: 63 %
Platelets: 227 10*3/uL (ref 150–440)
RBC: 3.46 MIL/uL — ABNORMAL LOW (ref 3.80–5.20)
RDW: 14 % (ref 11.5–14.5)
WBC: 8.7 10*3/uL (ref 3.6–11.0)

## 2015-12-24 LAB — TROPONIN I: Troponin I: <0.03 ng/mL (ref <0.031)

## 2015-12-24 LAB — GLUCOSE, CAPILLARY
GLUCOSE-CAPILLARY: 120 mg/dL — AB (ref 65–99)
GLUCOSE-CAPILLARY: 143 mg/dL — AB (ref 65–99)
Glucose-Capillary: 83 mg/dL (ref 65–99)

## 2015-12-24 MED ORDER — CIPROFLOXACIN HCL 500 MG PO TABS
500.0000 mg | ORAL_TABLET | Freq: Once | ORAL | Status: AC
  Administered 2015-12-24: 500 mg via ORAL
  Filled 2015-12-24: qty 1

## 2015-12-24 MED ORDER — CIPROFLOXACIN HCL 500 MG PO TABS: 500.0000 mg | ORAL_TABLET | Freq: Two times a day (BID) | ORAL | Status: DC

## 2015-12-24 NOTE — ED Notes (Addendum)
Pt from home via EMS for hypoglycemia. Pt glucose was 55 intitally, with snacks and candy bar pt came up to 100 en route. Pt A&O, denies all symptoms at this time. PT takes lantus at night and metformin during the day, states she ate like normal today. VS stable

## 2015-12-24 NOTE — Discharge Instructions (Signed)
1. Take antibiotic as prescribed (Cipro 500 mg twice daily 3 days). 2. Monitor your blood sugars closely for the next several days and adjust your medicines appropriately. 3. Return to the ER for worsening symptoms, persistent vomiting, fever, difficulty breathing or other concerns.  Hypoglycemia Hypoglycemia occurs when the glucose in your blood is too low. Glucose is a type of sugar that is your body's main energy source. Hormones, such as insulin and glucagon, control the level of glucose in the blood. Insulin lowers blood glucose and glucagon increases blood glucose. Having too much insulin in your blood stream, or not eating enough food containing sugar, can result in hypoglycemia. Hypoglycemia can happen to people with or without diabetes. It can develop quickly and can be a medical emergency.  CAUSES   Missing or delaying meals.  Not eating enough carbohydrates at meals.  Taking too much diabetes medicine.  Not timing your oral diabetes medicine or insulin doses with meals, snacks, and exercise.  Nausea and vomiting.  Certain medicines.  Severe illnesses, such as hepatitis, kidney disorders, and certain eating disorders.  Increased activity or exercise without eating something extra or adjusting medicines.  Drinking too much alcohol.  A nerve disorder that affects body functions like your heart rate, blood pressure, and digestion (autonomic neuropathy).  A condition where the stomach muscles do not function properly (gastroparesis). Therefore, medicines and food may not absorb properly.  Rarely, a tumor of the pancreas can produce too much insulin. SYMPTOMS   Hunger.  Sweating (diaphoresis).  Change in body temperature.  Shakiness.  Headache.  Anxiety.  Lightheadedness.  Irritability.  Difficulty concentrating.  Dry mouth.  Tingling or numbness in the hands or feet.  Restless sleep or sleep disturbances.  Altered speech and coordination.  Change in  mental status.  Seizures or prolonged convulsions.  Combativeness.  Drowsiness (lethargic).  Weakness.  Increased heart rate or palpitations.  Confusion.  Pale, gray skin color.  Blurred or double vision.  Fainting. DIAGNOSIS  A physical exam and medical history will be performed. Your caregiver may make a diagnosis based on your symptoms. Blood tests and other lab tests may be performed to confirm a diagnosis. Once the diagnosis is made, your caregiver will see if your signs and symptoms go away once your blood glucose is raised.  TREATMENT  Usually, you can easily treat your hypoglycemia when you notice symptoms.  Check your blood glucose. If it is less than 70 mg/dl, take one of the following:   3-4 glucose tablets.    cup juice.    cup regular soda.   1 cup skim milk.   -1 tube of glucose gel.   5-6 hard candies.   Avoid high-fat drinks or food that may delay a rise in blood glucose levels.  Do not take more than the recommended amount of sugary foods, drinks, gel, or tablets. Doing so will cause your blood glucose to go too high.   Wait 10-15 minutes and recheck your blood glucose. If it is still less than 70 mg/dl or below your target range, repeat treatment.   Eat a snack if it is more than 1 hour until your next meal.  There may be a time when your blood glucose may go so low that you are unable to treat yourself at home when you start to notice symptoms. You may need someone to help you. You may even faint or be unable to swallow. If you cannot treat yourself, someone will need to bring you  to the hospital.  [place]  If you have diabetes, follow your diabetes management plan by:  Taking your medicines as directed.  Following your exercise plan.  Following your meal plan. Do not skip meals. Eat on time.  Testing your blood glucose regularly. Check your blood glucose before and after exercise. If you exercise longer or  different than usual, be sure to check blood glucose more frequently.  Wearing your medical alert jewelry that says you have diabetes.  Identify the cause of your hypoglycemia. Then, develop ways to prevent the recurrence of hypoglycemia.  Do not take a hot bath or shower right after an insulin shot.  Always carry treatment with you. Glucose tablets are the easiest to carry.  If you are going to drink alcohol, drink it only with meals.  Tell friends or family members ways to keep you safe during a seizure. This may include removing hard or sharp objects from the area or turning you on your side.  Maintain a healthy weight. SEEK MEDICAL CARE IF:   You are having problems keeping your blood glucose in your target range.  You are having frequent episodes of hypoglycemia.  You feel you might be having side effects from your medicines.  You are not sure why your blood glucose is dropping so low.  You notice a change in vision or a new problem with your vision. SEEK IMMEDIATE MEDICAL CARE IF:   Confusion develops.  A change in mental status occurs.  The inability to swallow develops.  Fainting occurs.   This information is not intended to replace advice given to you by your health care provider. Make sure you discuss any questions you have with your health care provider.   Document Released: 10/04/2005 Document Revised: 10/09/2013 Document Reviewed: 06/10/2015 Elsevier Interactive Patient Education 2016 Else[employer]  Urinary Tract Infection Urinary tract infections (UTIs) can develop anywhere along your urinary tract. Your urinary tract is your body's drainage system for removing wastes and extra water. Your urinary tract includes two kidneys, two ureters, a bladder, and a urethra. Your kidneys are a pair of bean-shaped organs. Each kidney is about the size of your fist. They are located below your ribs, one on each side of your spine. CAUSES Infections are caused by microbes,  which are microscopic organisms, including fungi, viruses, and bacteria. These organisms are so small that they can only be seen through a microscope. Bacteria are the microbes that most commonly cause UTIs. SYMPTOMS  Symptoms of UTIs may vary by age and gender of the patient and by the location of the infection. Symptoms in young women typically include a frequent and intense urge to urinate and a painful, burning feeling in the bladder or urethra during urination. Older women and men are more likely to be tired, shaky, and weak and have muscle aches and abdominal pain. A fever may mean the infection is in your kidneys. Other symptoms of a kidney infection include pain in your back or sides below the ribs, nausea, and vomiting. DIAGNOSIS To diagnose a UTI, your caregiver will ask you about your symptoms. Your caregiver will also ask you to provide a urine sample. The urine sample will be tested for bacteria and white blood cells. White blood cells are made by your body to help fight infection. TREATMENT  Typically, UTIs can be treated with medication. Because most UTIs are caused by a bacterial infection, they usually can be treated with the use of antibiotics. The choice of antibiotic  and length of treatment depend on your symptoms and the type of bacteria causing your infection. HOME CARE INSTRUCTIONS  If you were prescribed antibiotics, take them exactly as your caregiver instructs you. Finish the medication even if you feel better after you have only taken some of the medication.  Drink enough water and fluids to keep your urine clear or pale yellow.  Avoid caffeine, tea, and carbonated beverages. They tend to irritate your bladder.  Empty your bladder often. Avoid holding urine for long periods of time.  Empty your bladder before and after sexual intercourse.  After a bowel movement, women should cleanse from front to back. Use each tissue only once. SEEK MEDICAL CARE IF:   You have back  pain.  You develop a fever.  Your symptoms do not begin to resolve within 3 days. SEEK IMMEDIATE MEDICAL CARE IF:   You have severe back pain or lower abdominal pain.  You develop chills.  You have nausea or vomiting.  You have continued burning or discomfort with urination. MAKE SURE YOU:   Understand these instructions.  Will watch your condition.  Will get help right away if you are not doing well or get worse.   This information is not intended to replace advice given to you by your health care provider. Make sure you discuss any questions you have with your health care provider.   Document Released: 07/14/2005 Document Revised: 06/25/2015 Document Reviewed: 11/12/2011 Elsevier Interactive Patient Education [employer].

## 2015-12-24 NOTE — ED Provider Notes (Signed)
[facility] Emergency Department Provider Note  ____________________________________________  Time seen: Approximately 3:20 AM  I have reviewed the triage vital signs and the nursing notes.   HISTORY  Chief Complaint Hypoglycemia    HPI Carolyn Valdez is a 75 y.o. female who presents to the ED from home via EMS with a chief complaint of hypoglycemia. Patient is an insulin-dependent diabetic who checked her blood sugar prior to nighttime insulin administration. States her glucose was 166. She gave herself her nighttime insulin shot and proceeded to work on her computer. She had eaten a granola bar and 1 small orange when she had an episode of diaphoresis. Patient denies prior episodes of hypoglycemia. She checked her sugar at that time which registered 55 on her monitor. She then proceeded to eat some snacks and a candy bar. Upon EMS arrival, patient's blood sugar was 100. Patient initially refused transport to the emergency department but subsequently agreed to come for evaluation. Patient states she had symptoms of yeast infection last week and her PCP called in a 3 day course of Diflucan for her. States symptoms of vaginal itchiness have resolved but she does still experience dysuria. Patient has chronic UTIs and is on prophylactic Macrobid daily. Denies recent fever, chills, cough, congestion, chest pain, shortness of breath, abdominal pain, nausea, vomiting. She has started to have loose stools while in the emergency department. Denies recent travel or trauma. Denies accidental overdose of medications. Nothing makes her symptoms better or worse.   Past Medical History  Diagnosis Date  . Diabetes mellitus without complication ([place])   . Breast cancer ([place])     16 yrs ago  . Hypercholesteremia   . DJD (degenerative joint disease)   . SVT (supraventricular tachycardia) ([place])   . Kidney stone   . H/O total knee replacement     right  . Dysuria   . HTN (hypertension)    . CHF (congestive heart failure) ([place])   . Anxiety   . Yeast vaginitis   . Vaginal atrophy   . Recurrent urinary tract infection   . Obesity   . Urinary incontinence     Patient Active Problem List   Diagnosis Date Noted  . Congestive heart failure ([place]) 11/25/2015  . Nocturia 11/05/2015  . Urinary frequency 11/05/2015  . Acute respiratory failure with hypoxia ([place]) 09/05/2015  . 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 Incontinence 04/20/2015  . Diabetes mellitus, type 2 ([place]) 04/17/2015  . ESBL (extended spectrum beta-lactamase) producing bacteria infection 04/17/2015  . BP (high blood pressure) 04/17/2015  . Frequent UTI 04/17/2015  . Absence of bladder continence 04/17/2015  . Iron deficiency anemia 03/08/2015  . Absolute anemia 09/25/2014  . Abdominal pain, lower 09/25/2014  . Urge incontinence 02/19/2013  . FOM (frequency of micturition) 02/19/2013  . Bladder infection, chronic 08/11/2012  . Difficult or painful urination 07/26/2012  . Lower urinary tract infection 12/31/2011  . Diabetes mellitus ([place]) 12/31/2011    Past Surgical History  Procedure Laterality Date  . Cholecystectomy    . Tonsillectomy    . Vocal cords    . Foot surgery    . Cardiac catheterization    . Nasal septum surgery    . Joint replacement    . Peripheral vascular catheterization N/A 07/08/2015    Procedure: PICC Line Insertion;  Surgeon: Algernon Huxley, MD;  Location: [facility];  Service: Cardiovascular;  Laterality: N/A;  . Mastectomy Right 1997    Current Outpatient Rx  Name  Route  Sig  Dispense  Refill  . amLODipine (NORVASC) 5 MG tablet   Oral   Take 5 mg by mouth daily.         Marland Kitchen amoxicillin-clavulanate (AUGMENTIN) 500-125 MG tablet   Oral   Take 1 tablet (500 mg total) by mouth 3 (three) times daily. Patient not taking: Reported on 11/24/2015   21 tablet   0   . atorvastatin (LIPITOR) 20 MG tablet   Oral   Take 20 mg by mouth at bedtime.         . benzonatate  (TESSALON) 100 MG capsule   Oral   Take 1 capsule (100 mg total) by mouth 3 (three) times daily as needed for cough. Patient not taking: Reported on 11/24/2015   20 capsule   0   . clonazePAM (KLONOPIN) 1 MG tablet   Oral   Take 1 mg by mouth 3 (three) times daily.         Marland Kitchen conjugated estrogens (PREMARIN) vaginal cream   Vaginal   Place 1 Applicatorful vaginally 3 (three) times a week. Pt uses on Monday, Wednesday, and Friday.         . desmopressin (DDAVP) 0.2 MG tablet   Oral   Take 1 tablet (0.2 mg total) by mouth at bedtime.   90 tablet   3   . docusate sodium (COLACE) 100 MG capsule   Oral   Take 200 mg by mouth 2 (two) times daily.         . fexofenadine (ALLEGRA) 180 MG tablet   Oral   Take 180 mg by mouth daily.         . furosemide (LASIX) 40 MG tablet   Oral   Take 1 tablet (40 mg total) by mouth daily.   30 tablet      . gabapentin (NEURONTIN) 300 MG capsule   Oral   Take 300-600 mg by mouth 3 (three) times daily. Pt takes two capsules in the morning, one capsule at lunch, and two capsules in the evening.         . hydrOXYzine (ATARAX/VISTARIL) 25 MG tablet   Oral   Take 25 mg by mouth at bedtime.          . insulin glargine (LANTUS) 100 UNIT/ML injection   Subcutaneous   Inject 50 Units into the skin at bedtime.         . iron polysaccharides (NIFEREX) 150 MG capsule   Oral   Take 150 mg by mouth daily.         Marland Kitchen losartan (COZAAR) 100 MG tablet   Oral   Take 100 mg by mouth daily.         . metFORMIN (GLUCOPHAGE) 1000 MG tablet               . metoprolol succinate (TOPROL-XL) 100 MG 24 hr tablet   Oral   Take 100 mg by mouth 2 (two) times daily.          . mirabegron ER (MYRBETRIQ) 50 MG TB24 tablet   Oral   Take 1 tablet (50 mg total) by mouth every evening.   90 tablet   3   . omeprazole (PRILOSEC) 40 MG capsule   Oral   Take 40 mg by mouth 2 (two) times daily.         . pioglitazone (ACTOS) 30 MG tablet    Oral   Take 30 mg by mouth daily.         Marland Kitchen  raloxifene (EVISTA) 60 MG tablet   Oral   Take 60 mg by mouth at bedtime.         . VESICARE 10 MG tablet   Oral   Take 1 tablet (10 mg total) by mouth daily.   90 tablet   3     Dispense as written.   . vitamin E 1000 UNIT capsule   Oral   Take 1,000 Units by mouth daily.           Allergies Sulfa antibiotics; Biaxin; Eggs or egg-derived products; Influenza a (h1n1) monoval vac; Morphine; Pyridium; Ceftriaxone; Latex; Prednisone; and Tape  Family History  Problem Relation Age of Onset  . Lung cancer Father   . Hematuria Mother   . Lung cancer Mother   . Kidney disease Neg Hx   . Bladder Cancer Neg Hx   . Breast cancer Neg Hx     Social History Social History  Substance Use Topics  . Smoking status: Former Research scientist (life sciences)  . Smokeless tobacco: Former Systems developer    Quit date: 10/29/1995  . Alcohol Use: No    Review of Systems  Constitutional: No fever/chills. Eyes: No visual changes. ENT: No sore throat. Cardiovascular: Denies chest pain. Respiratory: Denies shortness of breath. Gastrointestinal: No abdominal pain.  No nausea, no vomiting.  Positive for diarrhea.  No constipation. Genitourinary: Positive for yeast infection last week. Negative for dysuria. Musculoskeletal: Negative for back pain. Skin: Negative for rash. Neurological: Negative for headaches, focal weakness or numbness.  10-point ROS otherwise negative.  ____________________________________________   PHYSICAL EXAM:  VITAL SIGNS: ED Triage Vitals  Enc Vitals Group     BP 12/24/15 0154 107/38 mmHg     Pulse Rate 12/24/15 0154 58     Resp 12/24/15 0154 16     Temp --      Temp src --      SpO2 12/24/15 0154 99 %     Weight --      Height --      Head Cir --      Peak Flow --      Pain Score 12/24/15 0151 0     Pain Loc --      Pain Edu? --      Excl. in [place]? --     Constitutional: Alert and oriented. Well appearing and in no acute  distress. Eyes: Conjunctivae are normal. PERRL. EOMI. Head: Atraumatic. Nose: No congestion/rhinnorhea. Mouth/Throat: Mucous membranes are moist.  Oropharynx non-erythematous. Neck: No stridor.   Cardiovascular: Normal rate, regular rhythm. Grossly normal heart sounds.  Good peripheral circulation. Respiratory: Normal respiratory effort.  No retractions. Lungs CTAB. Gastrointestinal: Obese. Soft and nontender. No distention. No abdominal bruits. No CVA tenderness. Musculoskeletal: No lower extremity tenderness. 2+ nonpitting baseline BLE edema.  No joint effusions. Neurologic:  Normal speech and language. No gross focal neurologic deficits are appreciated.  Skin:  Skin is warm, dry and intact. No rash noted. Psychiatric: Mood and affect are normal. Speech and behavior are normal.  ____________________________________________   LABS (all labs ordered are listed, but only abnormal results are displayed)  Labs Reviewed  CBC WITH DIFFERENTIAL/PLATELET - Abnormal; Notable for the following:    RBC 3.46 (*)    Hemoglobin 10.4 (*)    HCT 30.7 (*)    All other components within normal limits  COMPREHENSIVE METABOLIC PANEL - Abnormal; Notable for the following:    BUN 44 (*)    Creatinine, Ser 1.43 (*)    ALT  12 (*)    GFR calc non Af Amer 36 (*)    GFR calc Af Amer 41 (*)    All other components within normal limits  URINALYSIS COMPLETEWITH MICROSCOPIC ([facility] ONLY) - Abnormal; Notable for the following:    Color, Urine YELLOW (*)    APPearance HAZY (*)    Protein, ur 100 (*)    Leukocytes, UA TRACE (*)    Bacteria, UA RARE (*)    Squamous Epithelial / LPF 0-5 (*)    All other components within normal limits  GLUCOSE, CAPILLARY - Abnormal; Notable for the following:    Glucose-Capillary 120 (*)    All other components within normal limits  GLUCOSE, CAPILLARY - Abnormal; Notable for the following:    Glucose-Capillary 143 (*)    All other components within normal limits  GLUCOSE,  CAPILLARY  TROPONIN I   ____________________________________________  EKG  ED ECG REPORT I, Leoncio Hansen J, the attending physician, personally viewed and interpreted this ECG.   Date: 12/24/2015  EKG Time: 0320  Rate: 50  Rhythm: sinus bradycardia  Axis: Normal  Intervals:none  ST&T Change: Nonspecific  ____________________________________________  RADIOLOGY  Portable chest x-ray (viewed by me, interpreted per Dr. Marisue Humble): Chronic cardiomegaly and interstitial prominence. No superimposed acute process. ____________________________________________   PROCEDURES  Procedure(s) performed: None  Critical Care performed: No  ____________________________________________   INITIAL IMPRESSION / ASSESSMENT AND PLAN / ED COURSE  Pertinent labs & imaging results that were available during my care of the patient were reviewed by me and considered in my medical decision making (see chart for details).  75 year old female with a history of insulin-dependent diabetes who presents for hypoglycemia. With food, patient sugar was 100 and Route. On arrival to the emergency department at this BS is 28. Patient is working on gram crackers and ginger ale at this time. Will obtain screening lab work included troponin, urinalysis, chest x-ray and reassess.  ----------------------------------------- 6:04 AM on 12/24/2015 -----------------------------------------  Patient has maintain her blood sugars after eating graham crackers. Blood sugar at 0437 was 120. Blood sugar at 0552 is 143. Will place on a three-day course of Cipro twice a day for leukocyte seen inpatient urine with chronic UTIs on daily antibiotic prophylaxis. Advised her to check her blood sugars very closely today and consider skipping her dose of oral and insulin diabetes medicines today. Strict return precautions given. Patient verbalizes understanding and agrees with plan of  care. ____________________________________________   FINAL CLINICAL IMPRESSION(S) / ED DIAGNOSES  Final diagnoses:  Hypoglycemia  Bladder infection, chronic      Paulette Blanch, MD 12/24/15 [phone]

## 2015-12-24 NOTE — ED Notes (Signed)
Pt eating crackers with peanut butter and ginger-ale to drink. Pt tolerating well.

## 2015-12-31 ENCOUNTER — Ambulatory Visit (INDEPENDENT_AMBULATORY_CARE_PROVIDER_SITE_OTHER): Payer: Medicare Other | Admitting: Surgery

## 2015-12-31 ENCOUNTER — Encounter: Payer: Self-pay | Admitting: Surgery

## 2015-12-31 VITALS — BP 167/92 | HR 61 | Temp 98.5°F | Ht 68.0 in | Wt 305.0 lb

## 2015-12-31 DIAGNOSIS — Z853 Personal history of malignant neoplasm of breast: Secondary | ICD-10-CM

## 2015-12-31 NOTE — Progress Notes (Signed)
Subjective:     Patient ID: Carolyn Valdez, female   DOB: [birth year], 75 y.o.   MRN: [id number]  HPI  75 year old female with history of right breast cancer and mammogram approximately 16 years prior. Coming back today after having her yearly mammogram for check. Patient states that she has not noticed any nipple discharge lumps bumps or skin changes in either breast. Patient denies any other complaints  Past Medical History  Diagnosis Date  . Diabetes mellitus without complication ([place])   . Breast cancer ([place])     16 yrs ago  . Hypercholesteremia   . DJD (degenerative joint disease)   . SVT (supraventricular tachycardia) ([place])   . Kidney stone   . H/O total knee replacement     right  . Dysuria   . HTN (hypertension)   . CHF (congestive heart failure) ([place])   . Anxiety   . Yeast vaginitis   . Vaginal atrophy   . Recurrent urinary tract infection   . Obesity   . Urinary incontinence    Past Surgical History  Procedure Laterality Date  . Cholecystectomy    . Tonsillectomy    . Vocal cords    . Foot surgery    . Cardiac catheterization    . Nasal septum surgery    . Joint replacement    . Peripheral vascular catheterization N/A 07/08/2015    Procedure: PICC Line Insertion;  Surgeon: Algernon Huxley, MD;  Location: [name];  Service: Cardiovascular;  Laterality: N/A;  . Mastectomy Right 1997   Family History  Problem Relation Age of Onset  . Lung cancer Father   . Hematuria Mother   . Lung cancer Mother   . Kidney disease Neg Hx   . Bladder Cancer Neg Hx   . Breast cancer Neg Hx    Social History   Social History  . Marital Status: Widowed    Spouse Name: N/A  . Number of Children: N/A  . Years of Education: N/A   Social History Main Topics  . Smoking status: Former Research scientist (life sciences)  . Smokeless tobacco: Former Systems developer    Quit date: 10/29/1995  . Alcohol Use: No  . Drug Use: No  . Sexual Activity: Not Asked   Other Topics Concern  . None   Social History  Narrative    Current outpatient prescriptions:  .  amLODipine (NORVASC) 5 MG tablet, Take 5 mg by mouth daily., Disp: , Rfl:  .  amoxicillin-clavulanate (AUGMENTIN) 500-125 MG tablet, Take 1 tablet (500 mg total) by mouth 3 (three) times daily., Disp: 21 tablet, Rfl: 0 .  atorvastatin (LIPITOR) 20 MG tablet, Take 20 mg by mouth at bedtime., Disp: , Rfl:  .  benzonatate (TESSALON) 100 MG capsule, Take 1 capsule (100 mg total) by mouth 3 (three) times daily as needed for cough., Disp: 20 capsule, Rfl: 0 .  ciprofloxacin (CIPRO) 500 MG tablet, Take 1 tablet (500 mg total) by mouth 2 (two) times daily., Disp: 6 tablet, Rfl: 0 .  clonazePAM (KLONOPIN) 1 MG tablet, Take 1 mg by mouth 3 (three) times daily., Disp: , Rfl:  .  conjugated estrogens (PREMARIN) vaginal cream, Place 1 Applicatorful vaginally 3 (three) times a week. Pt uses on Monday, Wednesday, and Friday., Disp: , Rfl:  .  desmopressin (DDAVP) 0.2 MG tablet, Take 1 tablet (0.2 mg total) by mouth at bedtime., Disp: 90 tablet, Rfl: 3 .  docusate sodium (COLACE) 100 MG capsule, Take 200 mg by mouth 2 (  two) times daily., Disp: , Rfl:  .  fexofenadine (ALLEGRA) 180 MG tablet, Take 180 mg by mouth daily., Disp: , Rfl:  .  furosemide (LASIX) 40 MG tablet, Take 1 tablet (40 mg total) by mouth daily., Disp: 30 tablet, Rfl:  .  gabapentin (NEURONTIN) 300 MG capsule, Take 300-600 mg by mouth 3 (three) times daily. Pt takes two capsules in the morning, one capsule at lunch, and two capsules in the evening., Disp: , Rfl:  .  hydrOXYzine (ATARAX/VISTARIL) 25 MG tablet, Take 25 mg by mouth at bedtime. , Disp: , Rfl:  .  insulin glargine (LANTUS) 100 UNIT/ML injection, Inject 50 Units into the skin at bedtime., Disp: , Rfl:  .  iron polysaccharides (NIFEREX) 150 MG capsule, Take 150 mg by mouth daily., Disp: , Rfl:  .  losartan (COZAAR) 100 MG tablet, Take 100 mg by mouth daily., Disp: , Rfl:  .  metFORMIN (GLUCOPHAGE) 1000 MG tablet, , Disp: , Rfl:  .   metoprolol succinate (TOPROL-XL) 100 MG 24 hr tablet, Take 100 mg by mouth 2 (two) times daily. , Disp: , Rfl:  .  mirabegron ER (MYRBETRIQ) 50 MG TB24 tablet, Take 1 tablet (50 mg total) by mouth every evening., Disp: 90 tablet, Rfl: 3 .  omeprazole (PRILOSEC) 40 MG capsule, Take 40 mg by mouth 2 (two) times daily., Disp: , Rfl:  .  pioglitazone (ACTOS) 30 MG tablet, Take 30 mg by mouth daily., Disp: , Rfl:  .  raloxifene (EVISTA) 60 MG tablet, Take 60 mg by mouth at bedtime., Disp: , Rfl:  .  VESICARE 10 MG tablet, Take 1 tablet (10 mg total) by mouth daily., Disp: 90 tablet, Rfl: 3 .  vitamin E 1000 UNIT capsule, Take 1,000 Units by mouth daily., Disp: , Rfl:  Allergies  Allergen Reactions  . Sulfa Antibiotics Hives  . Biaxin [Clarithromycin] Hives  . Eggs Or Egg-Derived Products Other (See Comments)    Pt states that her allergy test said she was allergic to eggs.    . Influenza A (H1n1) Monoval Vac Other (See Comments)    Pt states that she was told by her MD not to get the influenza vaccine.    . Morphine Other (See Comments)    Reaction:  Dizziness and confusion   . Pyridium [Phenazopyridine Hcl] Other (See Comments)    Reaction:  Unknown   . Ceftriaxone Anxiety  . Latex Rash  . Prednisone Rash  . Tape Rash      Review of Systems  Constitutional: Negative for fever, activity change and appetite change.  HENT: Negative for congestion and sore throat.   Respiratory: Negative for cough, shortness of breath and wheezing.   Cardiovascular: Negative for chest pain, palpitations and leg swelling.  Gastrointestinal: Negative for nausea, vomiting, abdominal pain and diarrhea.  Genitourinary: Negative for dysuria and hematuria.  Musculoskeletal: Negative for back pain and neck pain.  Skin: Negative for color change, pallor, rash and wound.  Neurological: Negative for dizziness and weakness.  Hematological: Negative for adenopathy. Does not bruise/bleed easily.   Psychiatric/Behavioral: Negative for agitation. The patient is not nervous/anxious.   All other systems reviewed and are negative.      Filed Vitals:   12/31/15 1454  BP: 167/92  Pulse: 61  Temp: 98.5 F (36.9 C)    Objective:   Physical Exam  Constitutional: She is oriented to person, place, and time. She appears well-developed and well-nourished. No distress.  HENT:  Head: Normocephalic and atraumatic.  Right Ear: External ear normal.  Left Ear: External ear normal.  Nose: Nose normal.  Mouth/Throat: Oropharynx is clear and moist. No oropharyngeal exudate.  Eyes: Conjunctivae and EOM are normal. Pupils are equal, round, and reactive to light. No scleral icterus.  Neck: Normal range of motion. Neck supple. No tracheal deviation present.  Cardiovascular: Normal rate, regular rhythm, normal heart sounds and intact distal pulses.  Exam reveals no gallop and no friction rub.   No murmur heard. Pulmonary/Chest: Effort normal. No respiratory distress. She has no wheezes. She has no rales.  Abdominal: Soft. Bowel sounds are normal. She exhibits no distension. There is no tenderness.  Neurological: She is alert and oriented to person, place, and time. No cranial nerve deficit.  Skin: Skin is warm and dry. No rash noted. No erythema. No pallor.  Psychiatric: She has a normal mood and affect. Her behavior is normal. Judgment and thought content normal.  Vitals reviewed. Right chest: well healed mastectomy scar, no masses, no lymphadenopathy Left chest: normal breat tissue, no palpable masses, no skin changes, no nipple discharge, no axillary lympadenopathy     Assessment:     75 yr old female with history of right breast cancer, here for yearly check up     Plan:     I personally reviewed her past medical history including mastectomy and previous mammograms.  I personally reveiwed her laboratory values wihtout abnormality.  I personally reviewed her mammogram images of the left  breast without any suspicious masses.  And I reviewed the radiology read of BiRADs 1.   Patient had normal exam today.  Did discuss with her that given much more than 5 years out that her risk for a second breast cancer is now decreased to the same as the rest of the population.  I also discussed with her that she should pick an age to stop having screening mammograms and suggested that 75 yrs old is a normal age that we would no longer recommend the routine Maintenance screening.  Patient will need another mammogram in 1 year with follow up exam, she would like to keep seeing me for this.  Will schedule for 1 year.

## 2016-01-13 ENCOUNTER — Telehealth: Payer: Self-pay | Admitting: *Deleted

## 2016-01-13 ENCOUNTER — Other Ambulatory Visit: Payer: Self-pay | Admitting: Family Medicine

## 2016-01-13 NOTE — Telephone Encounter (Signed)
Per Carolyn Valdez, AGNP-C can stop the Evista. Patient notified ok to stop the Evista.

## 2016-01-13 NOTE — Telephone Encounter (Signed)
Asking if she needs to continue taking Evista which was started 20 years ago for her breast cancer. Dr Ola Spurr has been writing her refills on it.

## 2016-02-23 ENCOUNTER — Emergency Department: Payer: Medicare Other

## 2016-02-23 ENCOUNTER — Emergency Department
Admission: EM | Admit: 2016-02-23 | Discharge: 2016-02-23 | Disposition: A | Discharge status: Home / Self Care | Payer: Medicare Other | Attending: Emergency Medicine | Admitting: Emergency Medicine

## 2016-02-23 ENCOUNTER — Encounter: Payer: Self-pay | Admitting: Emergency Medicine

## 2016-02-23 DIAGNOSIS — Z91040 Latex allergy status: Secondary | ICD-10-CM | POA: Diagnosis not present

## 2016-02-23 DIAGNOSIS — E119 Type 2 diabetes mellitus without complications: Secondary | ICD-10-CM | POA: Insufficient documentation

## 2016-02-23 DIAGNOSIS — Z79899 Other long term (current) drug therapy: Secondary | ICD-10-CM | POA: Insufficient documentation

## 2016-02-23 DIAGNOSIS — I110 Hypertensive heart disease with heart failure: Secondary | ICD-10-CM | POA: Insufficient documentation

## 2016-02-23 DIAGNOSIS — E669 Obesity, unspecified: Secondary | ICD-10-CM | POA: Diagnosis not present

## 2016-02-23 DIAGNOSIS — R600 Localized edema: Secondary | ICD-10-CM | POA: Diagnosis not present

## 2016-02-23 DIAGNOSIS — M7989 Other specified soft tissue disorders: Secondary | ICD-10-CM | POA: Diagnosis present

## 2016-02-23 DIAGNOSIS — Z794 Long term (current) use of insulin: Secondary | ICD-10-CM | POA: Diagnosis not present

## 2016-02-23 DIAGNOSIS — Z853 Personal history of malignant neoplasm of breast: Secondary | ICD-10-CM | POA: Diagnosis not present

## 2016-02-23 DIAGNOSIS — Z7984 Long term (current) use of oral hypoglycemic drugs: Secondary | ICD-10-CM | POA: Diagnosis not present

## 2016-02-23 DIAGNOSIS — Z87891 Personal history of nicotine dependence: Secondary | ICD-10-CM | POA: Diagnosis not present

## 2016-02-23 DIAGNOSIS — I509 Heart failure, unspecified: Secondary | ICD-10-CM | POA: Diagnosis not present

## 2016-02-23 DIAGNOSIS — R609 Edema, unspecified: Secondary | ICD-10-CM

## 2016-02-23 LAB — COMPREHENSIVE METABOLIC PANEL
ALT: 13 U/L — ABNORMAL LOW (ref 14–54)
AST: 21 U/L (ref 15–41)
Albumin: 4.3 g/dL (ref 3.5–5.0)
Alkaline Phosphatase: 86 U/L (ref 38–126)
Anion gap: 8 (ref 5–15)
BUN: 42 mg/dL — ABNORMAL HIGH (ref 6–20)
CO2: 28 mmol/L (ref 22–32)
Calcium: 9.7 mg/dL (ref 8.9–10.3)
Chloride: 98 mmol/L — ABNORMAL LOW (ref 101–111)
Creatinine, Ser: 1.4 mg/dL — ABNORMAL HIGH (ref 0.44–1.00)
GFR calc Af Amer: 42 mL/min — ABNORMAL LOW (ref >60)
GFR calc non Af Amer: 36 mL/min — ABNORMAL LOW (ref >60)
Glucose, Bld: 200 mg/dL — ABNORMAL HIGH (ref 65–99)
Potassium: 5.2 mmol/L — ABNORMAL HIGH (ref 3.5–5.1)
Sodium: 134 mmol/L — ABNORMAL LOW (ref 135–145)
Total Bilirubin: 0.6 mg/dL (ref 0.3–1.2)
Total Protein: 7.6 g/dL (ref 6.5–8.1)

## 2016-02-23 LAB — CBC WITH DIFFERENTIAL/PLATELET
BASOS ABS: 0 10*3/uL (ref 0–0.1)
BASOS PCT: 1 %
EOS ABS: 0.4 10*3/uL (ref 0–0.7)
Eosinophils Relative: 7 %
HCT: 31.3 % — ABNORMAL LOW (ref 35.0–47.0)
HEMOGLOBIN: 10.2 g/dL — AB (ref 12.0–16.0)
LYMPHS ABS: 1.4 10*3/uL (ref 1.0–3.6)
Lymphocytes Relative: 26 %
MCH: 28.3 pg (ref 26.0–34.0)
MCHC: 32.7 g/dL (ref 32.0–36.0)
MCV: 86.6 fL (ref 80.0–100.0)
Monocytes Absolute: 0.6 10*3/uL (ref 0.2–0.9)
Monocytes Relative: 12 %
NEUTROS PCT: 54 %
Neutro Abs: 3 10*3/uL (ref 1.4–6.5)
Platelets: 219 10*3/uL (ref 150–440)
RBC: 3.61 MIL/uL — AB (ref 3.80–5.20)
RDW: 13 % (ref 11.5–14.5)
WBC: 5.5 10*3/uL (ref 3.6–11.0)

## 2016-02-23 LAB — TROPONIN I: Troponin I: <0.03 ng/mL (ref <0.031)

## 2016-02-23 LAB — BRAIN NATRIURETIC PEPTIDE: B Natriuretic Peptide: 181 pg/mL — ABNORMAL HIGH (ref 0.0–100.0)

## 2016-02-23 MED ORDER — FUROSEMIDE 40 MG PO TABS
40.0000 mg | ORAL_TABLET | Freq: Once | ORAL | Status: AC
  Administered 2016-02-23: 40 mg via ORAL
  Filled 2016-02-23: qty 1

## 2016-02-23 NOTE — ED Notes (Signed)
Pt able to stand and ambulate to restroom with one assist. Pt in no acute distress at this time.

## 2016-02-23 NOTE — ED Provider Notes (Addendum)
[facility] Emergency Department Provider Note  ____________________________________________  Time seen: Approximately 5:34 PM  I have reviewed the triage vital signs and the nursing notes.   HISTORY  Chief Complaint Leg Swelling    HPI Carolyn Valdez is a 75 y.o. female history of diabetes, hypertension, congestive heart failure however last echo shows normal EF.  Patient reports that in December she had swelling in her legs, also bronchitis. She is on Lasix 40 mg twice a day. For the last several weeks to month she has had slow increasing swelling in both legs without pain. No shortness of breath or trouble breathing. No chest pain. She reports that she has gained several pounds over the last month and thinks that she has fluid accumulating her legs again.  She does sleep in an inclined position in bed, but states she's been doing though since 1990s and no change.  No history of any blood clots. No numbness tingling or weakness.  Past Medical History  Diagnosis Date  . Diabetes mellitus without complication ([place])   . Breast cancer ([place])     16 yrs ago  . Hypercholesteremia   . DJD (degenerative joint disease)   . SVT (supraventricular tachycardia) ([place])   . Kidney stone   . H/O total knee replacement     right  . Dysuria   . HTN (hypertension)   . CHF (congestive heart failure) ([place])   . Anxiety   . Yeast vaginitis   . Vaginal atrophy   . Recurrent urinary tract infection   . Obesity   . Urinary incontinence     Patient Active Problem List   Diagnosis Date Noted  . Congestive heart failure ([place]) 11/25/2015  . Nocturia 11/05/2015  . Urinary frequency 11/05/2015  . Acute respiratory failure with hypoxia ([place]) 09/05/2015  . 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 Incontinence 04/20/2015  . Diabetes mellitus, type 2 ([place]) 04/17/2015  . ESBL (extended spectrum beta-lactamase) producing bacteria infection 04/17/2015  . BP (high blood pressure) 04/17/2015   . Frequent UTI 04/17/2015  . Absence of bladder continence 04/17/2015  . Iron deficiency anemia 03/08/2015  . Absolute anemia 09/25/2014  . Abdominal pain, lower 09/25/2014  . Urge incontinence 02/19/2013  . FOM (frequency of micturition) 02/19/2013  . Bladder infection, chronic 08/11/2012  . Difficult or painful urination 07/26/2012  . Lower urinary tract infection 12/31/2011  . Diabetes mellitus ([place]) 12/31/2011    Past Surgical History  Procedure Laterality Date  . Cholecystectomy    . Tonsillectomy    . Vocal cords    . Foot surgery    . Cardiac catheterization    . Nasal septum surgery    . Joint replacement    . Peripheral vascular catheterization N/A 07/08/2015    Procedure: PICC Line Insertion;  Surgeon: Algernon Huxley, MD;  Location: [facility];  Service: Cardiovascular;  Laterality: N/A;  . Mastectomy Right 1997    Current Outpatient Rx  Name  Route  Sig  Dispense  Refill  . amLODipine (NORVASC) 5 MG tablet   Oral   Take 5 mg by mouth daily.         Marland Kitchen amoxicillin-clavulanate (AUGMENTIN) 500-125 MG tablet   Oral   Take 1 tablet (500 mg total) by mouth 3 (three) times daily.   21 tablet   0   . atorvastatin (LIPITOR) 20 MG tablet   Oral   Take 20 mg by mouth at bedtime.         Marland Kitchen  benzonatate (TESSALON) 100 MG capsule   Oral   Take 1 capsule (100 mg total) by mouth 3 (three) times daily as needed for cough.   20 capsule   0   . ciprofloxacin (CIPRO) 500 MG tablet   Oral   Take 1 tablet (500 mg total) by mouth 2 (two) times daily.   6 tablet   0   . clonazePAM (KLONOPIN) 1 MG tablet   Oral   Take 1 mg by mouth 3 (three) times daily.         Marland Kitchen conjugated estrogens (PREMARIN) vaginal cream   Vaginal   Place 1 Applicatorful vaginally 3 (three) times a week. Pt uses on Monday, Wednesday, and Friday.         . desmopressin (DDAVP) 0.2 MG tablet   Oral   Take 1 tablet (0.2 mg total) by mouth at bedtime.   90 tablet   3   . docusate  sodium (COLACE) 100 MG capsule   Oral   Take 200 mg by mouth 2 (two) times daily.         . fexofenadine (ALLEGRA) 180 MG tablet   Oral   Take 180 mg by mouth daily.         . furosemide (LASIX) 40 MG tablet   Oral   Take 1 tablet (40 mg total) by mouth daily.   30 tablet      . gabapentin (NEURONTIN) 300 MG capsule   Oral   Take 300-600 mg by mouth 3 (three) times daily. Pt takes two capsules in the morning, one capsule at lunch, and two capsules in the evening.         . hydrOXYzine (ATARAX/VISTARIL) 25 MG tablet   Oral   Take 25 mg by mouth at bedtime.          . insulin glargine (LANTUS) 100 UNIT/ML injection   Subcutaneous   Inject 50 Units into the skin at bedtime.         . iron polysaccharides (NIFEREX) 150 MG capsule   Oral   Take 150 mg by mouth daily.         Marland Kitchen losartan (COZAAR) 100 MG tablet   Oral   Take 100 mg by mouth daily.         . metFORMIN (GLUCOPHAGE) 1000 MG tablet               . metoprolol succinate (TOPROL-XL) 100 MG 24 hr tablet   Oral   Take 100 mg by mouth 2 (two) times daily.          . mirabegron ER (MYRBETRIQ) 50 MG TB24 tablet   Oral   Take 1 tablet (50 mg total) by mouth every evening.   90 tablet   3   . omeprazole (PRILOSEC) 40 MG capsule   Oral   Take 40 mg by mouth 2 (two) times daily.         . pioglitazone (ACTOS) 30 MG tablet   Oral   Take 30 mg by mouth daily.         . raloxifene (EVISTA) 60 MG tablet   Oral   Take 60 mg by mouth at bedtime.         . VESICARE 10 MG tablet   Oral   Take 1 tablet (10 mg total) by mouth daily.   90 tablet   3     Dispense as written.   . vitamin E 1000 UNIT capsule  Oral   Take 1,000 Units by mouth daily.           Allergies Sulfa antibiotics; Biaxin; Eggs or egg-derived products; Influenza a (h1n1) monoval vac; Morphine; Pyridium; Ceftriaxone; Latex; Prednisone; and Tape  Family History  Problem Relation Age of Onset  . Lung cancer Father    . Hematuria Mother   . Lung cancer Mother   . Kidney disease Neg Hx   . Bladder Cancer Neg Hx   . Breast cancer Neg Hx     Social History Social History  Substance Use Topics  . Smoking status: Former Research scientist (life sciences)  . Smokeless tobacco: Former Systems developer    Quit date: 10/29/1995  . Alcohol Use: No    Review of Systems Constitutional: No fever/chills Eyes: No visual changes. ENT: No sore throat. Cardiovascular: Denies chest pain. Respiratory: Denies shortness of breath. Gastrointestinal: No abdominal pain.  No nausea, no vomiting.  No diarrhea.  No constipation. Genitourinary: Negative for dysuria. Musculoskeletal: Negative for back pain. Skin: Negative for rash. Neurological: Negative for headaches, focal weakness or numbness.  10-point ROS otherwise negative.  ____________________________________________   PHYSICAL EXAM:  VITAL SIGNS: ED Triage Vitals  Enc Vitals Group     BP 02/23/16 1603 162/63 mmHg     Pulse Rate 02/23/16 1603 61     Resp 02/23/16 1603 20     Temp 02/23/16 1603 98.6 F (37 C)     Temp Source 02/23/16 1603 Oral     SpO2 --      Weight 02/23/16 1603 317 lb (143.79 kg)     Height 02/23/16 1603 5\' 8"  (1.727 m)     Head Cir --      Peak Flow --      Pain Score 02/23/16 1604 0     Pain Loc --      Pain Edu? --      Excl. in [place]? --    Constitutional: Alert and oriented. Well appearing and in no acute distress.Pleasant. Eyes: Conjunctivae are normal. PERRL. EOMI. Head: Atraumatic. Nose: No congestion/rhinnorhea. Mouth/Throat: Mucous membranes are moist.  Oropharynx non-erythematous. Neck: No stridor.   Cardiovascular: Normal rate, regular rhythm. Grossly normal heart sounds.  Good peripheral circulation. Respiratory: Normal respiratory effort.  No retractions. Lungs CTAB. Gastrointestinal: Soft and nontender. No distention. Morbid obesity. No abdominal bruits. No CVA tenderness. Musculoskeletal: No lower extremity tenderness moves extremities well.  Patient has 3+ pitting bilateral edema without erythema or rash area normal dorsalis pedis pulses bilateral, though moderate overlying edema is noted bilateral.  No joint effusions. Neurologic:  Normal speech and language. No gross focal neurologic deficits are appreciated. Skin:  Skin is warm, dry and intact. No rash noted. Psychiatric: Mood and affect are normal. Speech and behavior are normal.  ____________________________________________   LABS (all labs ordered are listed, but only abnormal results are displayed)  Labs Reviewed  CBC WITH DIFFERENTIAL/PLATELET - Abnormal; Notable for the following:    RBC 3.61 (*)    Hemoglobin 10.2 (*)    HCT 31.3 (*)    All other components within normal limits  COMPREHENSIVE METABOLIC PANEL - Abnormal; Notable for the following:    Sodium 134 (*)    Potassium 5.2 (*)    Chloride 98 (*)    Glucose, Bld 200 (*)    BUN 42 (*)    Creatinine, Ser 1.40 (*)    ALT 13 (*)    GFR calc non Af Amer 36 (*)    GFR calc Af Wyvonnia Lora  42 (*)    All other components within normal limits  BRAIN NATRIURETIC PEPTIDE - Abnormal; Notable for the following:    B Natriuretic Peptide 181.0 (*)    All other components within normal limits  TROPONIN I   ____________________________________________  EKG  Reviewed and are me at 1630 Ventricular rate 55 PR 160 QTc 420 Reviewed and interpreted as sinus tachycardia, no evidence of acute ischemic abnormality ____________________________________________  RADIOLOGY  DG Chest 2 View (Final result) Result time: 02/23/16 17:20:24   Final result by Rad Results In Interface (02/23/16 17:20:24)   Narrative:   CLINICAL DATA: Lower extremity edema for 3 weeks. Nonproductive cough.  EXAM: CHEST 2 VIEW  COMPARISON: 12/24/2015  FINDINGS: The heart is moderately enlarged. Vascular congestion. No interstitial edema. No pleural effusion. No pneumothorax.  IMPRESSION: Cardiomegaly and vascular congestion without  interstitial edema.   Electronically Signed By: Marybelle Killings M.D. On: 02/23/2016 17:20    ____________________________________________   PROCEDURES  Procedure(s) performed: None  Critical Care performed: No  ____________________________________________   INITIAL IMPRESSION / ASSESSMENT AND PLAN / ED COURSE  Pertinent labs & imaging results that were available during my care of the patient were reviewed by me and considered in my medical decision making (see chart for details).  Patient presents for evaluation of worsening peripheral edema. Chest x-ray does show cardiomegaly and vascular congestion but no frank interstitial edema, her oxygen saturation is normal and she does not have any cardiac or pulmonary symptoms. He  There is no evidence of frank pulmonary edema. Patient does have bilateral 3+ pitting edema, and it is equal bilateral and carries a history of the same. I do not find any obvious or clear symptoms suggest need to workup for DVT, she reports previous symptoms before currently on Lasix for same.  The patient appears stable, nontoxic and in no distress. Her creatinine appears at baseline, and her potassium is slightly elevated. Discussed with Dr. Ola Spurr her internal medicine physician who recommends doubling her dose of Lasix to 40 mg twice a day for the next 5 days and that he can see the patient in the clinic in the next 3-4 days if she calls for follow-up care.  Discussed plan with the patient is agreeable. I will not place her on supplemental potassium as her potassium level was 5.2 today. We will increase her Lasix to 40 mg twice daily, and she'll follow up closely.  Careful return precautions discussed as well as the plan for follow-up. Patient is agreeable. Reinforced with the patient she should return to the ER she is developing a cough, white sputum, shortness of breath, trouble breathing, chest pain or other new concerns arise in the interim. Patient  agreeable. ____________________________________________   FINAL CLINICAL IMPRESSION(S) / ED DIAGNOSES  Final diagnoses:  Edema, peripheral      Delman Kitten, MD 02/23/16 1742  Delman Kitten, MD 03/06/16 2119  Delman Kitten, MD 03/13/16 [id number]

## 2016-02-23 NOTE — ED Notes (Signed)
Reports swelling in legs.  Denies sob or cp.

## 2016-02-23 NOTE — ED Notes (Signed)
Pt taken to bathroom per request, hat placed in commode to catch her urine for specimen.

## 2016-02-23 NOTE — Discharge Instructions (Signed)
Please increase your lasix (furosemide) does to 40mg  by mouth TWICE daily for 5 days and call Dr. Blane [facility] clinic to setup follow-up for this week with him.  Peripheral Edema You have swelling in your legs (peripheral edema). This swelling is due to excess accumulation of salt and water in your body. Edema may be a sign of heart, kidney or liver disease, or a side effect of a medication. It may also be due to problems in the leg veins. Elevating your legs and using special support stockings may be very helpful, if the cause of the swelling is due to poor venous circulation. Avoid long periods of standing, whatever the cause. Treatment of edema depends on identifying the cause. Chips, pretzels, pickles and other salty foods should be avoided. Restricting salt in your diet is almost always needed. Water pills (diuretics) are often used to remove the excess salt and water from your body via urine. These medicines prevent the kidney from reabsorbing sodium. This increases urine flow. Diuretic treatment may also result in lowering of potassium levels in your body. Potassium supplements may be needed if you have to use diuretics daily. Daily weights can help you keep track of your progress in clearing your edema. You should call your caregiver for follow up care as recommended. SEEK IMMEDIATE MEDICAL CARE IF:   You have increased swelling, pain, redness, or heat in your legs.  You develop shortness of breath, especially when lying down.  You develop chest or abdominal pain, weakness, or fainting.  You have a fever.   This information is not intended to replace advice given to you by your health care provider. Make sure you discuss any questions you have with your health care provider.   Document Released: 11/11/2004 Document Revised: 12/27/2011 Document Reviewed: 04/16/2015 Elsevier Interactive Patient Education [employer].

## 2016-02-23 NOTE — ED Notes (Signed)
Pt reports swelling has been occuring for the past couple weeks. Pt reports she came to ED because it has become worse and blistering is noted on both legs. Pt reports this is normal for her when she begins retaining fluid.

## 2016-02-23 NOTE — ED Notes (Signed)
Pt taken to car by ED staff and assisted into car. Pt is driving self home and verbalized feeling fit to do so. Pt is in NAD.

## 2016-04-21 DIAGNOSIS — I890 Lymphedema, not elsewhere classified: Secondary | ICD-10-CM | POA: Insufficient documentation

## 2016-05-07 ENCOUNTER — Other Ambulatory Visit: Payer: Self-pay

## 2016-05-07 ENCOUNTER — Telehealth: Payer: Self-pay

## 2016-05-07 DIAGNOSIS — R300 Dysuria: Secondary | ICD-10-CM

## 2016-05-07 MED ORDER — URIBEL 118 MG PO CAPS: 1.0000 | ORAL_CAPSULE | Freq: Four times a day (QID) | ORAL | Status: DC

## 2016-05-07 NOTE — Telephone Encounter (Signed)
Pt called stating she feels like she has another UTI. Pt was offered an appt today with a nurse or f/u with [name] next week. Pt stated that she can not come today due to cable guy coming, cant come Monday due to dental appt, and does not want to wait until Tuesday. Made pt aware she will need a cath specimen. Pt voiced understanding. Pt stated she will call back Monday.

## 2016-05-07 NOTE — Progress Notes (Signed)
Pt called requesting uribel be sent to her pharmacy. Per [name] medication was sent to pharmacy.

## 2016-08-09 ENCOUNTER — Inpatient Hospital Stay
Admission: EM | Admit: 2016-08-09 | Discharge: 2016-08-12 | DRG: 291 | Disposition: A | Discharge status: Home Health | Payer: Medicare Other | Attending: Internal Medicine | Admitting: Internal Medicine

## 2016-08-09 ENCOUNTER — Emergency Department: Payer: Medicare Other

## 2016-08-09 DIAGNOSIS — Z801 Family history of malignant neoplasm of trachea, bronchus and lung: Secondary | ICD-10-CM | POA: Diagnosis not present

## 2016-08-09 DIAGNOSIS — I110 Hypertensive heart disease with heart failure: Principal | ICD-10-CM | POA: Diagnosis present

## 2016-08-09 DIAGNOSIS — E119 Type 2 diabetes mellitus without complications: Secondary | ICD-10-CM | POA: Diagnosis present

## 2016-08-09 DIAGNOSIS — E7800 Pure hypercholesterolemia, unspecified: Secondary | ICD-10-CM | POA: Diagnosis present

## 2016-08-09 DIAGNOSIS — I872 Venous insufficiency (chronic) (peripheral): Secondary | ICD-10-CM | POA: Diagnosis present

## 2016-08-09 DIAGNOSIS — I509 Heart failure, unspecified: Secondary | ICD-10-CM

## 2016-08-09 DIAGNOSIS — Z9011 Acquired absence of right breast and nipple: Secondary | ICD-10-CM

## 2016-08-09 DIAGNOSIS — M6281 Muscle weakness (generalized): Secondary | ICD-10-CM

## 2016-08-09 DIAGNOSIS — R600 Localized edema: Secondary | ICD-10-CM

## 2016-08-09 DIAGNOSIS — R262 Difficulty in walking, not elsewhere classified: Secondary | ICD-10-CM

## 2016-08-09 DIAGNOSIS — I5033 Acute on chronic diastolic (congestive) heart failure: Secondary | ICD-10-CM | POA: Diagnosis present

## 2016-08-09 DIAGNOSIS — Z9049 Acquired absence of other specified parts of digestive tract: Secondary | ICD-10-CM

## 2016-08-09 DIAGNOSIS — I878 Other specified disorders of veins: Secondary | ICD-10-CM | POA: Diagnosis present

## 2016-08-09 DIAGNOSIS — M7989 Other specified soft tissue disorders: Secondary | ICD-10-CM | POA: Diagnosis not present

## 2016-08-09 DIAGNOSIS — Z87440 Personal history of urinary (tract) infections: Secondary | ICD-10-CM | POA: Diagnosis not present

## 2016-08-09 DIAGNOSIS — Z87891 Personal history of nicotine dependence: Secondary | ICD-10-CM | POA: Diagnosis not present

## 2016-08-09 DIAGNOSIS — Z6841 Body Mass Index (BMI) 40.0 and over, adult: Secondary | ICD-10-CM

## 2016-08-09 DIAGNOSIS — Z87442 Personal history of urinary calculi: Secondary | ICD-10-CM

## 2016-08-09 DIAGNOSIS — J9601 Acute respiratory failure with hypoxia: Secondary | ICD-10-CM | POA: Diagnosis present

## 2016-08-09 DIAGNOSIS — Z96651 Presence of right artificial knee joint: Secondary | ICD-10-CM | POA: Diagnosis present

## 2016-08-09 DIAGNOSIS — Z794 Long term (current) use of insulin: Secondary | ICD-10-CM | POA: Diagnosis not present

## 2016-08-09 DIAGNOSIS — Z79899 Other long term (current) drug therapy: Secondary | ICD-10-CM | POA: Diagnosis not present

## 2016-08-09 DIAGNOSIS — J810 Acute pulmonary edema: Secondary | ICD-10-CM

## 2016-08-09 DIAGNOSIS — Z66 Do not resuscitate: Secondary | ICD-10-CM | POA: Diagnosis present

## 2016-08-09 DIAGNOSIS — F419 Anxiety disorder, unspecified: Secondary | ICD-10-CM | POA: Diagnosis present

## 2016-08-09 DIAGNOSIS — Z853 Personal history of malignant neoplasm of breast: Secondary | ICD-10-CM | POA: Diagnosis not present

## 2016-08-09 DIAGNOSIS — K219 Gastro-esophageal reflux disease without esophagitis: Secondary | ICD-10-CM | POA: Diagnosis present

## 2016-08-09 DIAGNOSIS — R2689 Other abnormalities of gait and mobility: Secondary | ICD-10-CM

## 2016-08-09 LAB — URINALYSIS COMPLETE WITH MICROSCOPIC (ARMC ONLY)
Bilirubin Urine: NEGATIVE
Glucose, UA: NEGATIVE mg/dL
Ketones, ur: NEGATIVE mg/dL
Nitrite: NEGATIVE
PH: 5 (ref 5.0–8.0)
PROTEIN: 100 mg/dL — AB
Specific Gravity, Urine: 1.014 (ref 1.005–1.030)

## 2016-08-09 LAB — COMPREHENSIVE METABOLIC PANEL
ALBUMIN: 3.6 g/dL (ref 3.5–5.0)
ALK PHOS: 64 U/L (ref 38–126)
ALT: 14 U/L (ref 14–54)
ANION GAP: 9 (ref 5–15)
AST: 21 U/L (ref 15–41)
BILIRUBIN TOTAL: 0.4 mg/dL (ref 0.3–1.2)
BUN: 40 mg/dL — AB (ref 6–20)
CALCIUM: 9.1 mg/dL (ref 8.9–10.3)
CO2: 29 mmol/L (ref 22–32)
CREATININE: 1.57 mg/dL — AB (ref 0.44–1.00)
Chloride: 102 mmol/L (ref 101–111)
GFR calc Af Amer: 37 mL/min — ABNORMAL LOW (ref >60)
GFR calc non Af Amer: 32 mL/min — ABNORMAL LOW (ref >60)
GLUCOSE: 163 mg/dL — AB (ref 65–99)
Potassium: 4.2 mmol/L (ref 3.5–5.1)
Sodium: 140 mmol/L (ref 135–145)
TOTAL PROTEIN: 7 g/dL (ref 6.5–8.1)

## 2016-08-09 LAB — CBC
HEMATOCRIT: 28.3 % — AB (ref 35.0–47.0)
HEMOGLOBIN: 9.1 g/dL — AB (ref 12.0–16.0)
MCH: 27.8 pg (ref 26.0–34.0)
MCHC: 32.3 g/dL (ref 32.0–36.0)
MCV: 86 fL (ref 80.0–100.0)
Platelets: 219 10*3/uL (ref 150–440)
RBC: 3.29 MIL/uL — ABNORMAL LOW (ref 3.80–5.20)
RDW: 15.6 % — ABNORMAL HIGH (ref 11.5–14.5)
WBC: 5.7 10*3/uL (ref 3.6–11.0)

## 2016-08-09 LAB — BRAIN NATRIURETIC PEPTIDE: B Natriuretic Peptide: 311 pg/mL — ABNORMAL HIGH (ref 0.0–100.0)

## 2016-08-09 LAB — GLUCOSE, CAPILLARY: Glucose-Capillary: 150 mg/dL — ABNORMAL HIGH (ref 65–99)

## 2016-08-09 LAB — TROPONIN I: Troponin I: <0.03 ng/mL (ref <0.03)

## 2016-08-09 MED ORDER — LORATADINE 10 MG PO TABS
10.0000 mg | ORAL_TABLET | Freq: Every day | ORAL | Status: DC
  Administered 2016-08-10 – 2016-08-12 (×3): 10 mg via ORAL
  Filled 2016-08-09 (×5): qty 1

## 2016-08-09 MED ORDER — ACETAMINOPHEN 325 MG PO TABS
650.0000 mg | ORAL_TABLET | Freq: Four times a day (QID) | ORAL | Status: DC | PRN
  Administered 2016-08-10 – 2016-08-12 (×6): 650 mg via ORAL
  Filled 2016-08-09 (×6): qty 2

## 2016-08-09 MED ORDER — POLYSACCHARIDE IRON COMPLEX 150 MG PO CAPS
150.0000 mg | ORAL_CAPSULE | Freq: Every day | ORAL | Status: DC
  Administered 2016-08-09 – 2016-08-12 (×4): 150 mg via ORAL
  Filled 2016-08-09 (×4): qty 1

## 2016-08-09 MED ORDER — SODIUM CHLORIDE 0.9% FLUSH
3.0000 mL | Freq: Two times a day (BID) | INTRAVENOUS | Status: DC
  Administered 2016-08-09 – 2016-08-12 (×5): 3 mL via INTRAVENOUS

## 2016-08-09 MED ORDER — FUROSEMIDE 10 MG/ML IJ SOLN
40.0000 mg | Freq: Two times a day (BID) | INTRAMUSCULAR | Status: DC
  Administered 2016-08-09 – 2016-08-10 (×3): 40 mg via INTRAVENOUS
  Filled 2016-08-09 (×3): qty 4

## 2016-08-09 MED ORDER — CLONAZEPAM 1 MG PO TABS
1.0000 mg | ORAL_TABLET | Freq: Three times a day (TID) | ORAL | Status: DC
Start: 2016-08-09 — End: 2016-08-12
  Administered 2016-08-09 – 2016-08-12 (×8): 1 mg via ORAL
  Filled 2016-08-09 (×8): qty 1

## 2016-08-09 MED ORDER — ATORVASTATIN CALCIUM 20 MG PO TABS
20.0000 mg | ORAL_TABLET | Freq: Every day | ORAL | Status: DC
  Administered 2016-08-09 – 2016-08-11 (×3): 20 mg via ORAL
  Filled 2016-08-09 (×3): qty 1

## 2016-08-09 MED ORDER — HEPARIN SODIUM (PORCINE) 5000 UNIT/ML IJ SOLN
5000.0000 [IU] | Freq: Three times a day (TID) | INTRAMUSCULAR | Status: DC
  Administered 2016-08-09 – 2016-08-10 (×2): 5000 [IU] via SUBCUTANEOUS
  Filled 2016-08-09 (×3): qty 1

## 2016-08-09 MED ORDER — METOPROLOL SUCCINATE ER 100 MG PO TB24
100.0000 mg | ORAL_TABLET | Freq: Two times a day (BID) | ORAL | Status: DC
  Administered 2016-08-09: 100 mg via ORAL
  Filled 2016-08-09: qty 1

## 2016-08-09 MED ORDER — INSULIN GLARGINE 100 UNIT/ML ~~LOC~~ SOLN
50.0000 [IU] | Freq: Every day | SUBCUTANEOUS | Status: DC
  Administered 2016-08-09 – 2016-08-11 (×3): 50 [IU] via SUBCUTANEOUS
  Filled 2016-08-09 (×4): qty 0.5

## 2016-08-09 MED ORDER — GABAPENTIN 300 MG PO CAPS
300.0000 mg | ORAL_CAPSULE | Freq: Every day | ORAL | Status: DC
  Administered 2016-08-10 – 2016-08-12 (×3): 300 mg via ORAL
  Filled 2016-08-09 (×3): qty 1

## 2016-08-09 MED ORDER — GABAPENTIN 300 MG PO CAPS: 300.0000 mg | ORAL_CAPSULE | Freq: Three times a day (TID) | ORAL | Status: DC

## 2016-08-09 MED ORDER — ORAL CARE MOUTH RINSE
15.0000 mL | Freq: Two times a day (BID) | OROMUCOSAL | Status: DC
  Administered 2016-08-10 – 2016-08-12 (×3): 15 mL via OROMUCOSAL

## 2016-08-09 MED ORDER — DOCUSATE SODIUM 100 MG PO CAPS
200.0000 mg | ORAL_CAPSULE | Freq: Two times a day (BID) | ORAL | Status: DC
  Administered 2016-08-09 – 2016-08-12 (×6): 200 mg via ORAL
  Filled 2016-08-09 (×6): qty 2

## 2016-08-09 MED ORDER — VITAMIN E 45 MG (100 UNIT) PO CAPS
1000.0000 [IU] | ORAL_CAPSULE | Freq: Every day | ORAL | Status: DC
  Administered 2016-08-10 – 2016-08-12 (×3): 1000 [IU] via ORAL
  Filled 2016-08-09 (×3): qty 10

## 2016-08-09 MED ORDER — DESMOPRESSIN ACETATE 0.2 MG PO TABS
0.2000 mg | ORAL_TABLET | Freq: Every day | ORAL | Status: DC
Start: 2016-08-09 — End: 2016-08-12
  Administered 2016-08-10 – 2016-08-11 (×2): 0.2 mg via ORAL
  Filled 2016-08-09 (×4): qty 1

## 2016-08-09 MED ORDER — FUROSEMIDE 10 MG/ML IJ SOLN
60.0000 mg | Freq: Once | INTRAMUSCULAR | Status: AC
  Administered 2016-08-09: 60 mg via INTRAVENOUS
  Filled 2016-08-09: qty 8

## 2016-08-09 MED ORDER — INSULIN ASPART 100 UNIT/ML ~~LOC~~ SOLN
0.0000 [IU] | Freq: Three times a day (TID) | SUBCUTANEOUS | Status: DC
  Administered 2016-08-10 – 2016-08-11 (×2): 2 [IU] via SUBCUTANEOUS
  Administered 2016-08-11: 3 [IU] via SUBCUTANEOUS
  Administered 2016-08-12: 2 [IU] via SUBCUTANEOUS
  Filled 2016-08-09: qty 2
  Filled 2016-08-09: qty 3
  Filled 2016-08-09 (×2): qty 2

## 2016-08-09 MED ORDER — ONDANSETRON HCL 4 MG/2ML IJ SOLN: 4.0000 mg | Freq: Four times a day (QID) | INTRAMUSCULAR | Status: DC | PRN

## 2016-08-09 MED ORDER — HYDROXYZINE HCL 25 MG PO TABS
25.0000 mg | ORAL_TABLET | Freq: Every day | ORAL | Status: DC
  Administered 2016-08-09 – 2016-08-11 (×3): 25 mg via ORAL
  Filled 2016-08-09 (×3): qty 1

## 2016-08-09 MED ORDER — ONDANSETRON HCL 4 MG PO TABS: 4.0000 mg | ORAL_TABLET | Freq: Four times a day (QID) | ORAL | Status: DC | PRN

## 2016-08-09 MED ORDER — GABAPENTIN 300 MG PO CAPS
600.0000 mg | ORAL_CAPSULE | Freq: Two times a day (BID) | ORAL | Status: DC
  Administered 2016-08-09 – 2016-08-12 (×6): 600 mg via ORAL
  Filled 2016-08-09 (×6): qty 2

## 2016-08-09 MED ORDER — AMLODIPINE BESYLATE 5 MG PO TABS
5.0000 mg | ORAL_TABLET | Freq: Every day | ORAL | Status: DC
  Administered 2016-08-10 – 2016-08-12 (×3): 5 mg via ORAL
  Filled 2016-08-09 (×4): qty 1

## 2016-08-09 MED ORDER — MIRABEGRON ER 50 MG PO TB24
50.0000 mg | ORAL_TABLET | Freq: Every evening | ORAL | Status: DC
Start: 2016-08-09 — End: 2016-08-12
  Administered 2016-08-11: 50 mg via ORAL
  Filled 2016-08-09 (×4): qty 1

## 2016-08-09 MED ORDER — INSULIN ASPART 100 UNIT/ML ~~LOC~~ SOLN
0.0000 [IU] | Freq: Every day | SUBCUTANEOUS | Status: DC
  Administered 2016-08-11: 3 [IU] via SUBCUTANEOUS
  Filled 2016-08-09: qty 3

## 2016-08-09 MED ORDER — PANTOPRAZOLE SODIUM 40 MG PO TBEC
40.0000 mg | DELAYED_RELEASE_TABLET | Freq: Every day | ORAL | Status: DC
  Administered 2016-08-09 – 2016-08-12 (×4): 40 mg via ORAL
  Filled 2016-08-09 (×4): qty 1

## 2016-08-09 MED ORDER — RALOXIFENE HCL 60 MG PO TABS: 60.0000 mg | ORAL_TABLET | Freq: Every day | ORAL | Status: DC

## 2016-08-09 MED ORDER — ACETAMINOPHEN 650 MG RE SUPP: 650.0000 mg | Freq: Four times a day (QID) | RECTAL | Status: DC | PRN

## 2016-08-09 MED ORDER — FUROSEMIDE 10 MG/ML IJ SOLN: 80.0000 mg | Freq: Once | INTRAMUSCULAR | Status: DC

## 2016-08-09 MED ORDER — DARIFENACIN HYDROBROMIDE ER 7.5 MG PO TB24
7.5000 mg | ORAL_TABLET | Freq: Every day | ORAL | Status: DC
  Administered 2016-08-09 – 2016-08-11 (×3): 7.5 mg via ORAL
  Filled 2016-08-09 (×4): qty 1

## 2016-08-09 NOTE — Progress Notes (Signed)
Patient arrived to 2A Room 245. Patient denies pain and all questions answered. Patient oriented to unit and Fall Safety Plan signed. Skin assessment completed with Vincente Liberty RN; MASD noted on lower interior buttocks and yeast noted in abdominal skin folds. Barrier cream and antifungal powder applied to these areas. A&Ox4, VSS, and SB on verified tele-box #[id number]. Call bell within reach, yellow socks on, and side rails up x2. Nursing staff will continue to monitor for any changes in patient status. Earleen Reaper, RN

## 2016-08-09 NOTE — ED Provider Notes (Signed)
[facility] Emergency Department Provider Note   ____________________________________________    I have reviewed the triage vital signs and the nursing notes.   HISTORY  Chief Complaint Leg Swelling     HPI Carolyn Valdez is a 76 y.o. female who presents with complaints of lower show any swelling, and diffuse weakness. She reports she has been feeling more and more low energy over the last several days. She has been compliant with her Lasix 40 mg. She denies chest pain. Intermittent soreness of breath, no pleurisy. No fevers or chills or cough.   Past Medical History:  Diagnosis Date  . Anxiety   . Breast cancer ([place])    16 yrs ago  . CHF (congestive heart failure) ([place])   . Diabetes mellitus without complication ([place])   . DJD (degenerative joint disease)   . Dysuria   . H/O total knee replacement    right  . HTN (hypertension)   . Hypercholesteremia   . Kidney stone   . Obesity   . Recurrent urinary tract infection   . SVT (supraventricular tachycardia) ([place])   . Urinary incontinence   . Vaginal atrophy   . Yeast vaginitis     Patient Active Problem List   Diagnosis Date Noted  . Congestive heart failure ([place]) 11/25/2015  . Nocturia 11/05/2015  . Urinary frequency 11/05/2015  . Acute respiratory failure with hypoxia ([place]) 09/05/2015  . 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 Incontinence 04/20/2015  . Diabetes mellitus, type 2 ([place]) 04/17/2015  . ESBL (extended spectrum beta-lactamase) producing bacteria infection 04/17/2015  . BP (high blood pressure) 04/17/2015  . Frequent UTI 04/17/2015  . Absence of bladder continence 04/17/2015  . Iron deficiency anemia 03/08/2015  . Absolute anemia 09/25/2014  . Abdominal pain, lower 09/25/2014  . Urge incontinence 02/19/2013  . FOM (frequency of micturition) 02/19/2013  . Bladder infection, chronic 08/11/2012  . Difficult or painful urination 07/26/2012  . Lower urinary tract infection 12/31/2011    . Diabetes mellitus ([place]) 12/31/2011    Past Surgical History:  Procedure Laterality Date  . CARDIAC CATHETERIZATION    . CHOLECYSTECTOMY    . FOOT SURGERY    . JOINT REPLACEMENT    . MASTECTOMY Right 1997  . NASAL SEPTUM SURGERY    . PERIPHERAL VASCULAR CATHETERIZATION N/A 07/08/2015   Procedure: PICC Line Insertion;  Surgeon: Algernon Huxley, MD;  Location: [facility];  Service: Cardiovascular;  Laterality: N/A;  . TONSILLECTOMY    . Vocal cords      Prior to Admission medications   Medication Sig Start Date End Date Taking? Authorizing Provider  amLODipine (NORVASC) 5 MG tablet Take 5 mg by mouth daily.    Historical Provider, MD  amoxicillin-clavulanate (AUGMENTIN) 500-125 MG tablet Take 1 tablet (500 mg total) by mouth 3 (three) times daily. 11/05/15   [name] A McGowan, PA-C  atorvastatin (LIPITOR) 20 MG tablet Take 20 mg by mouth at bedtime.    Historical Provider, MD  benzonatate (TESSALON) 100 MG capsule Take 1 capsule (100 mg total) by mouth 3 (three) times daily as needed for cough. 10/07/15   Henreitta Leber, MD  ciprofloxacin (CIPRO) 500 MG tablet Take 1 tablet (500 mg total) by mouth 2 (two) times daily. 12/24/15   Paulette Blanch, MD  clonazePAM (KLONOPIN) 1 MG tablet Take 1 mg by mouth 3 (three) times daily.    Historical Provider, MD  conjugated estrogens (PREMARIN) vaginal cream Place 1 Applicatorful vaginally  3 (three) times a week. Pt uses on Monday, Wednesday, and Friday.    Historical Provider, MD  desmopressin (DDAVP) 0.2 MG tablet Take 1 tablet (0.2 mg total) by mouth at bedtime. 11/05/15   [name] A McGowan, PA-C  docusate sodium (COLACE) 100 MG capsule Take 200 mg by mouth 2 (two) times daily.    Historical Provider, MD  fexofenadine (ALLEGRA) 180 MG tablet Take 180 mg by mouth daily.    Historical Provider, MD  furosemide (LASIX) 40 MG tablet Take 1 tablet (40 mg total) by mouth daily. 10/07/15   Henreitta Leber, MD  gabapentin (NEURONTIN) 300 MG capsule Take  300-600 mg by mouth 3 (three) times daily. Pt takes two capsules in the morning, one capsule at lunch, and two capsules in the evening.    Historical Provider, MD  hydrOXYzine (ATARAX/VISTARIL) 25 MG tablet Take 25 mg by mouth at bedtime.     Historical Provider, MD  insulin glargine (LANTUS) 100 UNIT/ML injection Inject 50 Units into the skin at bedtime.    Historical Provider, MD  iron polysaccharides (NIFEREX) 150 MG capsule Take 150 mg by mouth daily.    Historical Provider, MD  losartan (COZAAR) 100 MG tablet Take 100 mg by mouth daily.    Historical Provider, MD  metFORMIN (GLUCOPHAGE) 1000 MG tablet  10/25/15   Historical Provider, MD  Meth-Hyo-M Bl-Na Phos-Ph Sal (URIBEL) 118 MG CAPS Take 1 capsule (118 mg total) by mouth 4 (four) times daily. 05/07/16   [name] A McGowan, PA-C  metoprolol succinate (TOPROL-XL) 100 MG 24 hr tablet Take 100 mg by mouth 2 (two) times daily.     Historical Provider, MD  mirabegron ER (MYRBETRIQ) 50 MG TB24 tablet Take 1 tablet (50 mg total) by mouth every evening. 11/05/15   Nori Riis, PA-C  omeprazole (PRILOSEC) 40 MG capsule Take 40 mg by mouth 2 (two) times daily.    Historical Provider, MD  pioglitazone (ACTOS) 30 MG tablet Take 30 mg by mouth daily.    Historical Provider, MD  raloxifene (EVISTA) 60 MG tablet Take 60 mg by mouth at bedtime.    Historical Provider, MD  VESICARE 10 MG tablet Take 1 tablet (10 mg total) by mouth daily. 11/05/15   Nori Riis, PA-C  vitamin E 1000 UNIT capsule Take 1,000 Units by mouth daily.    Historical Provider, MD     Allergies Sulfa antibiotics; Biaxin [clarithromycin]; Eggs or egg-derived products; Influenza a (h1n1) monoval vac; Morphine; Pyridium [phenazopyridine hcl]; Ceftriaxone; Latex; Prednisone; and Tape  Family History  Problem Relation Age of Onset  . Lung cancer Father   . Hematuria Mother   . Lung cancer Mother   . Kidney disease Neg Hx   . Bladder Cancer Neg Hx   . Breast cancer Neg Hx      Social History Social History  Substance Use Topics  . Smoking status: Former Research scientist (life sciences)  . Smokeless tobacco: Former Systems developer    Quit date: 10/29/1995  . Alcohol use No    Review of Systems  Constitutional: No fever/chills Eyes: No visual changes.  ENT: No sore throat. Cardiovascular: Denies chest pain. Respiratory: As above Gastrointestinal: No abdominal pain.  No nausea, no vomiting.   Genitourinary: Negative for dysuria. Musculoskeletal: Leg edema as above Skin: Negative for rash. Neurological: Negative for headaches   10-point ROS otherwise negative.  ____________________________________________   PHYSICAL EXAM:  VITAL SIGNS: ED Triage Vitals  Enc Vitals Group     BP 08/09/16 1551 Marland Kitchen)  167/57     Pulse Rate 08/09/16 1549 (!) 59     Resp --      Temp --      Temp src --      SpO2 08/09/16 1546 (!) 78 %     Weight --      Height 08/09/16 1545 5\' 7"  (1.702 m)     Head Circumference --      Peak Flow --      Pain Score 08/09/16 1545 0     Pain Loc --      Pain Edu? --      Excl. in [place]? --     Constitutional: Alert and oriented. No acute distress. Pleasant and interactive Eyes: Conjunctivae are normal.   Nose: No congestion/rhinnorhea. Mouth/Throat: Mucous membranes are moist.    Cardiovascular: Normal rate, regular rhythm. Grossly normal heart sounds.  Good peripheral circulation. Respiratory: Normal respiratory effort.  No retractions. Bibasilar rales. Gastrointestinal: Soft and nontender. No distention.  No CVA tenderness. Genitourinary: deferred Musculoskeletal: 2+ edema bilaterally.  Warm and well perfused Neurologic:  Normal speech and language. No gross focal neurologic deficits are appreciated.  Skin:  Skin is warm, dry and intact. No rash noted. Psychiatric: Mood and affect are normal. Speech and behavior are normal.  ____________________________________________   LABS (all labs ordered are listed, but only abnormal results are displayed)  Labs  Reviewed  CBC - Abnormal; Notable for the following:       Result Value   RBC 3.29 (*)    Hemoglobin 9.1 (*)    HCT 28.3 (*)    RDW 15.6 (*)    All other components within normal limits  COMPREHENSIVE METABOLIC PANEL - Abnormal; Notable for the following:    Glucose, Bld 163 (*)    BUN 40 (*)    Creatinine, Ser 1.57 (*)    GFR calc non Af Amer 32 (*)    GFR calc Af Amer 37 (*)    All other components within normal limits  BRAIN NATRIURETIC PEPTIDE - Abnormal; Notable for the following:    B Natriuretic Peptide 311.0 (*)    All other components within normal limits  URINALYSIS COMPLETEWITH MICROSCOPIC ([facility] ONLY) - Abnormal; Notable for the following:    Color, Urine YELLOW (*)    APPearance HAZY (*)    Hgb urine dipstick 1+ (*)    Protein, ur 100 (*)    Leukocytes, UA TRACE (*)    Bacteria, UA RARE (*)    Squamous Epithelial / LPF 0-5 (*)    All other components within normal limits  TROPONIN I   ____________________________________________  EKG  ED ECG REPORT I, Lavonia Drafts, the attending physician, personally viewed and interpreted this ECG.  Date: 08/09/2016 EKG Time: 3:52 PM Rate: 63 Rhythm: normal sinus rhythm QRS Axis: normal Intervals: normal ST/T Wave abnormalities: normal Conduction Disturbances: none   ____________________________________________  RADIOLOGY  Chest x-ray consistent with interstitial edema ____________________________________________   PROCEDURES  Procedure(s) performed: No    Critical Care performed: No ____________________________________________   INITIAL IMPRESSION / ASSESSMENT AND PLAN / ED COURSE  Pertinent labs & imaging results that were available during my care of the patient were reviewed by me and considered in my medical decision making (see chart for details).  Patient presents with increased lower extremity edema and diffuse weakness, rales on lung exam concerning for interstitial edema given her hypoxia.  She is requiring oxygen in the emergency department.  Clinical Course  Chest x-ray  confirms edema. We will give IV Lasix and admitted to the hospital ____________________________________________   FINAL CLINICAL IMPRESSION(S) / ED DIAGNOSES  Final diagnoses:  Leg edema  Acute pulmonary edema (HCC)      NEW MEDICATIONS STARTED DURING THIS VISIT:  New Prescriptions   No medications on file     Note:  This document was prepared using Dragon voice recognition software and may include unintentional dictation errors.    Lavonia Drafts, MD 08/09/16 [phone]

## 2016-08-09 NOTE — H&P (Signed)
[place] [facility] [place] NAME: Carolyn Valdez    MR#:  [id number]  DATE OF BIRTH:  1940/12/11  DATE OF ADMISSION:  08/09/2016  PRIMARY CARE PHYSICIAN: Leonel Ramsay, MD   REQUESTING/REFERRING PHYSICIAN: Dr. Lavonia Drafts  CHIEF COMPLAINT:   Chief Complaint  Patient presents with  . Leg Swelling    HISTORY OF PRESENT ILLNESS:  Carolyn Valdez  is a 75 y.o. female with a known history of Breast cancer, diastolic CHF, diabetes, degenerative disc disease, osteoarthritis, history of ESBL UTI, history of SVT, urinary incontinence who presents to the hospital due to lower extremity edema and shortness of breath. Patient says that she has had worsening lower extremity edema over the past few days. She cannot recall how much weight she has gained. She admits to shortness of breath which is at rest and also with exertion. She denies any chest pain, nausea, vomiting, abdominal pain, fever, chills, cough or any recent sick contacts. She says that she was not feeling well and therefore came to the ER for further evaluation. In the emergency room patient was undergoing hypoxic with O2 sats in the mid 70s. Her chest x-ray findings were suggestive of interstitial edema and she had worsening lower extremity edema and therefore hospitalist services were contacted further treatment and evaluation.  PAST MEDICAL HISTORY:   Past Medical History:  Diagnosis Date  . Anxiety   . Breast cancer ([place])    16 yrs ago  . CHF (congestive heart failure) ([place])   . Diabetes mellitus without complication ([place])   . DJD (degenerative joint disease)   . Dysuria   . H/O total knee replacement    right  . HTN (hypertension)   . Hypercholesteremia   . Kidney stone   . Obesity   . Recurrent urinary tract infection   . SVT (supraventricular tachycardia) ([place])   . Urinary incontinence   . Vaginal atrophy   . Yeast vaginitis     PAST SURGICAL HISTORY:   Past Surgical History:   Procedure Laterality Date  . CARDIAC CATHETERIZATION    . CHOLECYSTECTOMY    . FOOT SURGERY    . JOINT REPLACEMENT    . MASTECTOMY Right 1997  . NASAL SEPTUM SURGERY    . PERIPHERAL VASCULAR CATHETERIZATION N/A 07/08/2015   Procedure: PICC Line Insertion;  Surgeon: Algernon Huxley, MD;  Location: [facility];  Service: Cardiovascular;  Laterality: N/A;  . TONSILLECTOMY    . Vocal cords      SOCIAL HISTORY:   Social History  Substance Use Topics  . Smoking status: Former Research scientist (life sciences)  . Smokeless tobacco: Former Systems developer    Quit date: 10/29/1995  . Alcohol use No    FAMILY HISTORY:   Family History  Problem Relation Age of Onset  . Lung cancer Father   . Hematuria Mother   . Lung cancer Mother   . Kidney disease Neg Hx   . Bladder Cancer Neg Hx   . Breast cancer Neg Hx     DRUG ALLERGIES:   Allergies  Allergen Reactions  . Sulfa Antibiotics Hives  . Biaxin [Clarithromycin] Hives  . Eggs Or Egg-Derived Products Other (See Comments)    Pt states that her allergy test said she was allergic to eggs.    . Influenza A (H1n1) Monoval Vac Other (See Comments)    Pt states that she was told by her MD not to get the influenza vaccine.    Marland Kitchen  Morphine Other (See Comments)    Reaction:  Dizziness and confusion   . Pyridium [Phenazopyridine Hcl] Other (See Comments)    Reaction:  Unknown   . Ceftriaxone Anxiety  . Latex Rash  . Prednisone Rash  . Tape Rash    REVIEW OF SYSTEMS:   Review of Systems  Constitutional: Negative for fever and weight loss.  HENT: Negative for congestion, nosebleeds and tinnitus.   Eyes: Negative for blurred vision, double vision and redness.  Respiratory: Positive for shortness of breath. Negative for cough and hemoptysis.   Cardiovascular: Positive for leg swelling. Negative for chest pain, orthopnea and PND.  Gastrointestinal: Negative for abdominal pain, diarrhea, melena, nausea and vomiting.  Genitourinary: Negative for dysuria, hematuria and  urgency.  Musculoskeletal: Negative for falls and joint pain.  Neurological: Negative for dizziness, tingling, sensory change, focal weakness, seizures, weakness and headaches.  Endo/Heme/Allergies: Negative for polydipsia. Does not bruise/bleed easily.  Psychiatric/Behavioral: Negative for depression and memory loss. The patient is not nervous/anxious.     MEDICATIONS AT HOME:   Prior to Admission medications   Medication Sig Start Date End Date Taking? Authorizing Provider  desmopressin (DDAVP) 0.2 MG tablet Take 1 tablet (0.2 mg total) by mouth at bedtime. 11/05/15  Yes Shannon A McGowan, PA-C  VESICARE 10 MG tablet Take 1 tablet (10 mg total) by mouth daily. 11/05/15  Yes Shannon A McGowan, PA-C  amLODipine (NORVASC) 5 MG tablet Take 5 mg by mouth daily.    Historical Provider, MD  atorvastatin (LIPITOR) 20 MG tablet Take 20 mg by mouth at bedtime.    Historical Provider, MD  clonazePAM (KLONOPIN) 1 MG tablet Take 1 mg by mouth 3 (three) times daily.    Historical Provider, MD  conjugated estrogens (PREMARIN) vaginal cream Place 1 Applicatorful vaginally 3 (three) times a week. Pt uses on Monday, Wednesday, and Friday.    Historical Provider, MD  docusate sodium (COLACE) 100 MG capsule Take 200 mg by mouth 2 (two) times daily.    Historical Provider, MD  fexofenadine (ALLEGRA) 180 MG tablet Take 180 mg by mouth daily.    Historical Provider, MD  furosemide (LASIX) 40 MG tablet Take 1 tablet (40 mg total) by mouth daily. 10/07/15   Henreitta Leber, MD  gabapentin (NEURONTIN) 300 MG capsule Take 300-600 mg by mouth 3 (three) times daily. Pt takes two capsules in the morning, one capsule at lunch, and two capsules in the evening.    Historical Provider, MD  hydrOXYzine (ATARAX/VISTARIL) 25 MG tablet Take 25 mg by mouth at bedtime.     Historical Provider, MD  insulin glargine (LANTUS) 100 UNIT/ML injection Inject 50 Units into the skin at bedtime.    Historical Provider, MD  iron  polysaccharides (NIFEREX) 150 MG capsule Take 150 mg by mouth daily.    Historical Provider, MD  losartan (COZAAR) 100 MG tablet Take 100 mg by mouth daily.    Historical Provider, MD  metFORMIN (GLUCOPHAGE) 1000 MG tablet  10/25/15   Historical Provider, MD  Meth-Hyo-M Bl-Na Phos-Ph Sal (URIBEL) 118 MG CAPS Take 1 capsule (118 mg total) by mouth 4 (four) times daily. 05/07/16   [name] A McGowan, PA-C  metoprolol succinate (TOPROL-XL) 100 MG 24 hr tablet Take 100 mg by mouth 2 (two) times daily.     Historical Provider, MD  mirabegron ER (MYRBETRIQ) 50 MG TB24 tablet Take 1 tablet (50 mg total) by mouth every evening. 11/05/15   Nori Riis, PA-C  omeprazole (PRILOSEC) 40  MG capsule Take 40 mg by mouth 2 (two) times daily.    Historical Provider, MD  pioglitazone (ACTOS) 30 MG tablet Take 30 mg by mouth daily.    Historical Provider, MD  raloxifene (EVISTA) 60 MG tablet Take 60 mg by mouth at bedtime.    Historical Provider, MD  vitamin E 1000 UNIT capsule Take 1,000 Units by mouth daily.    Historical Provider, MD      VITAL SIGNS:  Blood pressure (!) 154/52, pulse 69, temperature 98.2 F (36.8 C), resp. rate 20, height 5\' 7"  (1.702 m), SpO2 98 %.  PHYSICAL EXAMINATION:  Physical Exam  GENERAL:  75 y.o.-year-old obese patient lying in the bed with no acute distress.  EYES: Pupils equal, round, reactive to light and accommodation. No scleral icterus. Extraocular muscles intact.  HEENT: Head atraumatic, normocephalic. Oropharynx and nasopharynx clear. No oropharyngeal erythema, moist oral mucosa  NECK:  Supple, no jugular venous distention. No thyroid enlargement, no tenderness.  LUNGS: Normal breath sounds bilaterally, no wheezing, rhonchi, bibasilar rales. No use of accessory muscles of respiration.  CARDIOVASCULAR: S1, S2 RRR. No murmurs, rubs, gallops, clicks.  ABDOMEN: Soft, nontender, nondistended. Bowel sounds present. No organomegaly or mass.  EXTREMITIES: + 1-2 edema b/l.  Signs  of chronic venous stasis b/l, No cyanosis, or clubbing. + 2 pedal & radial pulses b/l.   NEUROLOGIC: Cranial nerves II through XII are intact. No focal Motor or sensory deficits appreciated b/l.  Globally weak.  PSYCHIATRIC: The patient is alert and oriented x 3. Good affect.  SKIN: No obvious rash, lesion, or ulcer.   LABORATORY PANEL:   CBC  Recent Labs Lab 08/09/16 1550  WBC 5.7  HGB 9.1*  HCT 28.3*  PLT 219   ------------------------------------------------------------------------------------------------------------------  Chemistries   Recent Labs Lab 08/09/16 1550  NA 140  K 4.2  CL 102  CO2 29  GLUCOSE 163*  BUN 40*  CREATININE 1.57*  CALCIUM 9.1  AST 21  ALT 14  ALKPHOS 64  BILITOT 0.4   ------------------------------------------------------------------------------------------------------------------  Cardiac Enzymes  Recent Labs Lab 08/09/16 1550  TROPONINI <0.03   ------------------------------------------------------------------------------------------------------------------  RADIOLOGY:  Dg Chest Port 1 View  Result Date: 08/09/2016 CLINICAL DATA:  75 y/o  F; hypoxia with history of heart failure. EXAM: PORTABLE CHEST 1 VIEW COMPARISON:  02/23/2016 chest radiograph FINDINGS: Moderate cardiomegaly. Prominent interstitial markings and haziness of lung parenchyma probably represents interstitial pulmonary edema. No consolidation. Bones are unremarkable. Aortic atherosclerosis with arch calcifications. IMPRESSION: Interstitial pulmonary edema and cardiomegaly. Electronically Signed   By: Kristine Garbe M.D.   On: 08/09/2016 17:19     IMPRESSION AND PLAN:   75 year old female with past medical history of hypertension, diastolic CHF, osteoarthritis, degenerative disc disease, history of breast cancer, history of recurrent UTIs, history of ESBL UTI, anxiety, history of SVT who presents to the hospital due to worsening lower extremity edema and  shortness of breath.   1. Acute respiratory failure with hypoxia-secondary to decompensated CHF. -We'll diurese her with IV Lasix, follow I's and O's and daily weights. Continue O2 supplementation. -Assess for home oxygen prior to discharge.  2. CHF-acute on chronic diastolic dysfunction - diuresed with IV Lasix, follow I's and O's and daily weights. - cont. Losartan, Metoprolol.   3. Essential HTN - cont. Norvasc, Metoprolol   4. DM - cont. Lantus, SSI and follow BS - hold metformin, Actos.   5. GERD - cont. Protonix.   6. Hx of breast cancer - cont. Evista  7.  Urinary Incontinence - cont. Vesicare.   8. Hyperlipidemia - cont. Atorvastatin  Will get PT consult.    All the records are reviewed and case discussed with ED provider. Management plans discussed with the patient, family and they are in agreement.  CODE STATUS: Full code   TOTAL TIME TAKING CARE OF THIS PATIENT: 45 minutes.    Henreitta Leber M.D on 08/09/2016 at 6:31 PM  Between 7am to 6pm - Pager - [phone]  After 6pm go to [email or web address] - password [email or web address] [facility]  [facility]  Office  [phone]  CC: Primary care physician; Leonel Ramsay, MD

## 2016-08-09 NOTE — ED Notes (Signed)
MD at bedside. 

## 2016-08-09 NOTE — ED Triage Notes (Addendum)
Pt from home via EMS, reports increased fluid retention in legs recently with blistering. Hx of CHF, takes lasix  Pt hypoxic on arrival on RA, placed on [place]

## 2016-08-10 ENCOUNTER — Inpatient Hospital Stay
Admit: 2016-08-10 | Discharge: 2016-08-10 | Disposition: A | Discharge status: Home / Self Care | Payer: Medicare Other | Attending: Internal Medicine | Admitting: Internal Medicine

## 2016-08-10 LAB — BASIC METABOLIC PANEL
ANION GAP: 7 (ref 5–15)
BUN: 37 mg/dL — ABNORMAL HIGH (ref 6–20)
CALCIUM: 9 mg/dL (ref 8.9–10.3)
CHLORIDE: 102 mmol/L (ref 101–111)
CO2: 32 mmol/L (ref 22–32)
CREATININE: 1.41 mg/dL — AB (ref 0.44–1.00)
GFR calc non Af Amer: 36 mL/min — ABNORMAL LOW (ref >60)
GFR, EST AFRICAN AMERICAN: 42 mL/min — AB (ref >60)
Glucose, Bld: 120 mg/dL — ABNORMAL HIGH (ref 65–99)
Potassium: 3.8 mmol/L (ref 3.5–5.1)
SODIUM: 141 mmol/L (ref 135–145)

## 2016-08-10 LAB — ECHOCARDIOGRAM COMPLETE
HEIGHTINCHES: 67 in
Weight: 5089.6 oz

## 2016-08-10 LAB — CBC
HCT: 27.4 % — ABNORMAL LOW (ref 35.0–47.0)
Hemoglobin: 8.9 g/dL — ABNORMAL LOW (ref 12.0–16.0)
MCH: 28 pg (ref 26.0–34.0)
MCHC: 32.6 g/dL (ref 32.0–36.0)
MCV: 85.9 fL (ref 80.0–100.0)
PLATELETS: 200 10*3/uL (ref 150–440)
RBC: 3.19 MIL/uL — AB (ref 3.80–5.20)
RDW: 15.9 % — ABNORMAL HIGH (ref 11.5–14.5)
WBC: 5 10*3/uL (ref 3.6–11.0)

## 2016-08-10 LAB — GLUCOSE, CAPILLARY
GLUCOSE-CAPILLARY: 79 mg/dL (ref 65–99)
GLUCOSE-CAPILLARY: 190 mg/dL — AB (ref 65–99)
Glucose-Capillary: 138 mg/dL — ABNORMAL HIGH (ref 65–99)
Glucose-Capillary: 191 mg/dL — ABNORMAL HIGH (ref 65–99)

## 2016-08-10 MED ORDER — METOPROLOL SUCCINATE ER 25 MG PO TB24
25.0000 mg | ORAL_TABLET | Freq: Two times a day (BID) | ORAL | Status: DC
  Administered 2016-08-10 – 2016-08-12 (×4): 25 mg via ORAL
  Filled 2016-08-10 (×5): qty 1

## 2016-08-10 MED ORDER — POTASSIUM CHLORIDE CRYS ER 20 MEQ PO TBCR
20.0000 meq | EXTENDED_RELEASE_TABLET | Freq: Every day | ORAL | Status: DC
  Administered 2016-08-10 – 2016-08-11 (×2): 20 meq via ORAL
  Filled 2016-08-10 (×3): qty 1

## 2016-08-10 MED ORDER — ENOXAPARIN SODIUM 40 MG/0.4ML ~~LOC~~ SOLN
40.0000 mg | Freq: Two times a day (BID) | SUBCUTANEOUS | Status: DC
  Administered 2016-08-10 – 2016-08-12 (×4): 40 mg via SUBCUTANEOUS
  Filled 2016-08-10 (×4): qty 0.4

## 2016-08-10 MED ORDER — LEVOFLOXACIN IN D5W 750 MG/150ML IV SOLN
750.0000 mg | INTRAVENOUS | Status: DC
  Administered 2016-08-10 – 2016-08-11 (×2): 750 mg via INTRAVENOUS
  Filled 2016-08-10 (×2): qty 150

## 2016-08-10 NOTE — Plan of Care (Signed)
Problem: Skin Integrity: Goal: Risk for impaired skin integrity will decrease Outcome: Progressing Pt agreeable to using barrier cream on MSAD on buttocks d/t incontinence/frequent urination

## 2016-08-10 NOTE — Progress Notes (Signed)
Zinc wrap with unna boots applied per Santiago Glad, [place] RN request. Santiago Glad said she will be back tomorrow to check on patient and change dressing if needed. No other orders placed for patient at this time. Legs are intact, just red, edematous with callous-like blisters that patient says have been there for years.

## 2016-08-10 NOTE — Progress Notes (Signed)
Pharmacy Antibiotic Note  Carolyn Valdez is a 75 y.o. female admitted on 08/09/2016 with UTI.  Pharmacy has been consulted for levaquin dosing.  Plan: Levofloxacin 750 mg IV q24h  Height: 5\' 7"  (170.2 cm) Weight: (!) 318 lb 1.6 oz (144.3 kg) IBW/kg (Calculated) : 61.6  Temp (24hrs), Avg:97.5 F (36.4 C), Min:96.8 F (36 C), Max:98.2 F (36.8 C)   Recent Labs Lab 08/09/16 1550 08/10/16 0423  WBC 5.7 5.0  CREATININE 1.57* 1.41*    Estimated Creatinine Clearance: 53.1 mL/min (by C-G formula based on SCr of 1.41 mg/dL (H)).    Allergies  Allergen Reactions  . Sulfa Antibiotics Hives  . Biaxin [Clarithromycin] Hives  . Eggs Or Egg-Derived Products Other (See Comments)    Pt states that her allergy test said she was allergic to eggs.    . Influenza A (H1n1) Monoval Vac Other (See Comments)    Pt states that she was told by her MD not to get the influenza vaccine.    . Morphine Other (See Comments)    Reaction:  Dizziness and confusion   . Pyridium [Phenazopyridine Hcl] Other (See Comments)    Reaction:  Unknown   . Ceftriaxone Anxiety  . Latex Rash  . Prednisone Rash  . Tape Rash    Antimicrobials this admission: Levaquin 10/24 >>  Dose adjustments this admission:   Microbiology results:  UCx: ordered   Thank you for allowing pharmacy to be a part of this patient's care.  Rocky Morel 08/10/2016 9:51 AM

## 2016-08-10 NOTE — Evaluation (Signed)
Physical Therapy Evaluation Patient Details Name: Carolyn Valdez MRN: [id number] DOB: [birth year] Today's Date: 08/10/2016   History of Present Illness  Pt. is a 75 y.o. female who came to the ED with increase of fluid in LE's and SOB. Pt. admitted to [facility] following finding of hypoxia (70s) and acute pulmonary edema. Hx: CHF, hypoxia, cardiomegaly  Clinical Impression  Pt. Supine in bed upon arrival. Pt. Demonstrates generalized weakness grossly 4/5 strength throughout. Pt. Requires modA for bed mobility, and +2 assist for further mobility for safety. Pt. Requires min A +2 for sit<>stand transfers with use of BRW demonstrating slow transition throughout movement and requiring vc's for sequencing, pt. Able to ambulate approx. 90ft. To BSC with min A +2 and use of BRW requiring vc's for sequencing. Pt. Remained on BSC for extended period of time stating that she felt she was just about to go but needed more time, Pt. Demonstrates good sitting balance with BUE and BLE supported while seated on BSC. Pt. On room air upon arrival at rest spO2 81%, 1L [place] donned pt. spO2 92%. Would benefit from skilled PT to address above deficits and promote optimal return to PLOF Recommend SNF placement upon d/c to further address skilled PT needs.     Follow Up Recommendations SNF    Equipment Recommendations       Recommendations for Other Services       Precautions / Restrictions Precautions Precautions: Fall Restrictions Weight Bearing Restrictions: No      Mobility  Bed Mobility Overal bed mobility: Needs Assistance Bed Mobility: Supine to Sit     Supine to sit: Mod assist     General bed mobility comments: Pt. requires assist for trunk support, LE negotiation and full positioning at EOB.  Transfers Overall transfer level: Needs assistance Equipment used: Rolling walker (2 wheeled) Transfers: Sit to/from Stand Sit to Stand: +2 safety/equipment;Min assist         General transfer comment: Pt.  requires verbal cues for proper technique, slow transition through movement.   Ambulation/Gait Ambulation/Gait assistance: +2 safety/equipment;Min assist Ambulation Distance (Feet): 2 Feet Assistive device: Rolling walker (2 wheeled)       General Gait Details: Pt. able to take a few step to [facility], vc's for RW negotiation. step to pattern   Stairs            Wheelchair Mobility    Modified Rankin (Stroke Patients Only)       Balance Overall balance assessment: Needs assistance Sitting-balance support: Feet supported Sitting balance-Leahy Scale: Poor Sitting balance - Comments: Pt. requires B UE support to maintain sustained sitting balance at EOB   Standing balance support: Bilateral upper extremity supported Standing balance-Leahy Scale: Fair                               Pertinent Vitals/Pain Pain Assessment: No/denies pain    Home Living Family/patient expects to be discharged to:: Private residence Living Arrangements: Alone Available Help at Discharge: Personal care attendant (Pt. reports she has someone she can call intermittently for assist) Type of Home: House Home Access: Ramped entrance       Home Equipment: Walker - 2 wheels;Cane - single point;Other (comment) (lift chair (pt. sleeps in))      Prior Function Level of Independence: Independent with assistive device(s)         Comments: Pt. uses RW for ambulation within home at baseline. She reports she is independent  for dressing/bathing and gets takeout or fast food for meals     Hand Dominance        Extremity/Trunk Assessment   Upper Extremity Assessment: Generalized weakness (Pt. demonstrates grossly 4*5 strength symmetrical)           Lower Extremity Assessment: Generalized weakness (Pt. demonstrates grossly 4*5 strength B )         Communication   Communication: No difficulties  Cognition Arousal/Alertness: Awake/alert Behavior During Therapy: WFL for tasks  assessed/performed Overall Cognitive Status: Within Functional Limits for tasks assessed                      General Comments      Exercises Other Exercises Other Exercises: Pt. requested use of BSC during session, able to perform transfers sit<>stand and approx. 49ft. ambulation min A +2 for safety   Assessment/Plan    PT Assessment Patient needs continued PT services  PT Problem List Decreased strength;Decreased balance;Decreased mobility;Decreased activity tolerance;Decreased range of motion;Decreased coordination;Decreased knowledge of use of DME;Decreased knowledge of precautions          PT Treatment Interventions DME instruction;Gait training;Therapeutic activities;Stair training;Therapeutic exercise;Neuromuscular re-education;Balance training;Functional mobility training;Patient/family education    PT Goals (Current goals can be found in the Care Plan section)  Acute Rehab PT Goals Patient Stated Goal: Pt. wants to get up to [facility] PT Goal Formulation: With patient Time For Goal Achievement: 08/24/16 Potential to Achieve Goals: Good    Frequency Min 2X/week   Barriers to discharge Decreased caregiver support Pt. lives alone and does not have 24/7 assist available     Co-evaluation               End of Session Equipment Utilized During Treatment: Gait belt;Oxygen Activity Tolerance: Patient limited by fatigue (Pt. requested more time on [facility]) Patient left: with call bell/phone within reach (Pt. on [facility], aid informed )           Time: 8882-8003 PT Time Calculation (min) (ACUTE ONLY): 39 min   Charges:         PT G Codes:        Carolyn Valdez, SPT  08/10/16,4:34 PM

## 2016-08-10 NOTE — Progress Notes (Signed)
*  PRELIMINARY RESULTS* Echocardiogram 2D Echocardiogram has been performed.  Carolyn Valdez 08/10/2016, 2:09 PM

## 2016-08-10 NOTE — Progress Notes (Signed)
[place] at [place] NAME: Carolyn Valdez    MR#:  [id number]  DATE OF BIRTH:  12/06/1940  SUBJECTIVE:  CHIEF COMPLAINT:   Chief Complaint  Patient presents with  . Leg Swelling   - presented with lower extremity edema and also hypoxia - on IV lasix, complains of increased urinary frequency  REVIEW OF SYSTEMS:  Review of Systems  Constitutional: Positive for malaise/fatigue. Negative for chills and fever.  HENT: Negative for ear discharge, ear pain and tinnitus.   Eyes: Negative for blurred vision and double vision.  Respiratory: Positive for shortness of breath. Negative for cough and wheezing.   Cardiovascular: Positive for leg swelling. Negative for chest pain and palpitations.  Gastrointestinal: Negative for abdominal pain, constipation, diarrhea, nausea and vomiting.  Genitourinary: Positive for dysuria and frequency. Negative for urgency.  Neurological: Negative for dizziness, sensory change, speech change, focal weakness, seizures and headaches.  Psychiatric/Behavioral: Negative for depression.    DRUG ALLERGIES:   Allergies  Allergen Reactions  . Sulfa Antibiotics Hives  . Biaxin [Clarithromycin] Hives  . Eggs Or Egg-Derived Products Other (See Comments)    Pt states that her allergy test said she was allergic to eggs.    . Influenza A (H1n1) Monoval Vac Other (See Comments)    Pt states that she was told by her MD not to get the influenza vaccine.    . Morphine Other (See Comments)    Reaction:  Dizziness and confusion   . Pyridium [Phenazopyridine Hcl] Other (See Comments)    Reaction:  Unknown   . Ceftriaxone Anxiety  . Latex Rash  . Prednisone Rash  . Tape Rash    VITALS:  Blood pressure (!) 145/56, pulse (!) 51, temperature (!) 96.8 F (36 C), temperature source Axillary, resp. rate 18, height 5\' 7"  (1.702 m), weight (!) 144.3 kg (318 lb 1.6 oz), SpO2 99 %.  PHYSICAL EXAMINATION:  Physical Exam  GENERAL:  75  y.o.-year-old obese patient lying in the bed with no acute distress.  EYES: Pupils equal, round, reactive to light and accommodation. No scleral icterus. Extraocular muscles intact.  HEENT: Head atraumatic, normocephalic. Oropharynx and nasopharynx clear.  NECK:  Supple, no jugular venous distention. No thyroid enlargement, no tenderness.  LUNGS: Normal breath sounds bilaterally, no wheezing, rales,rhonchi or crepitation. No use of accessory muscles of respiration. Fine bibasilar crackles heard CARDIOVASCULAR: S1, S2 normal. No murmurs, rubs, or gallops.  ABDOMEN: Soft, nontender, nondistended. Bowel sounds present. No organomegaly or mass.  EXTREMITIES: 3+ lower extremity edema noted up to the knees. Chronic skin changes noted as well. No cyanosis, or clubbing.  NEUROLOGIC: Cranial nerves II through XII are intact. Muscle strength 5/5 in all extremities. Sensation intact. Gait not checked.  PSYCHIATRIC: The patient is alert and oriented x 3.  SKIN: No obvious rash, lesion, or ulcer.    LABORATORY PANEL:   CBC  Recent Labs Lab 08/10/16 0423  WBC 5.0  HGB 8.9*  HCT 27.4*  PLT 200   ------------------------------------------------------------------------------------------------------------------  Chemistries   Recent Labs Lab 08/09/16 1550 08/10/16 0423  NA 140 141  K 4.2 3.8  CL 102 102  CO2 29 32  GLUCOSE 163* 120*  BUN 40* 37*  CREATININE 1.57* 1.41*  CALCIUM 9.1 9.0  AST 21  --   ALT 14  --   ALKPHOS 64  --   BILITOT 0.4  --    ------------------------------------------------------------------------------------------------------------------  Cardiac Enzymes  Recent Labs Lab 08/09/16 1550  TROPONINI <0.03   ------------------------------------------------------------------------------------------------------------------  RADIOLOGY:  Dg Chest Port 1 View  Result Date: 08/09/2016 CLINICAL DATA:  75 y/o  F; hypoxia with history of heart failure. EXAM:  PORTABLE CHEST 1 VIEW COMPARISON:  02/23/2016 chest radiograph FINDINGS: Moderate cardiomegaly. Prominent interstitial markings and haziness of lung parenchyma probably represents interstitial pulmonary edema. No consolidation. Bones are unremarkable. Aortic atherosclerosis with arch calcifications. IMPRESSION: Interstitial pulmonary edema and cardiomegaly. Electronically Signed   By: Kristine Garbe M.D.   On: 08/09/2016 17:19    EKG:   Orders placed or performed during the hospital encounter of 02/23/16  . ED EKG  . ED EKG  . EKG    ASSESSMENT AND PLAN:   75 year old female with past medical history of hypertension, diastolic CHF, osteoarthritis, degenerative disc disease, history of breast cancer, history of recurrent UTIs, history of ESBL UTI, anxiety, history of SVT who presents to the hospital due to worsening lower extremity edema and shortness of breath.   1. Acute respiratory failure with hypoxia-secondary to decompensated CHF. -Acute on chronic diastolic CHF exacerbation. Last echo was about a year ago. Repeat echocardiogram now. -Continue Lasix IV twice a day -follow I's and O's and daily weights. Continue needing 2 L oxygen. Not on any home oxygen -Assess for home oxygen prior to discharge.  2. Lower extremity edema-from CHF and also venous insufficiency. -Continue Lasix IV. Also wound consult for unna boots  3. Essential HTN - cont. Norvasc, Metoprolol   4. DM - cont. Lantus, SSI and follow BS - hold metformin, Actos.   5. GERD - cont. Protonix.   6. Acute cystitis-though UA was barely positive, patient complains of significant dysuria and frequency of urination. Cultures sent. Started Levaquin  7. Urinary Incontinence - cont. Vesicare.   8. DVT prophylaxis-on subcutaneous heparin   Physical therapy consulted   All the records are reviewed and case discussed with Care Management/Social Workerr. Management plans discussed with the patient, family  and they are in agreement.  CODE STATUS: Full code  TOTAL TIME TAKING CARE OF THIS PATIENT: 37 minutes.   POSSIBLE D/C IN 2 DAYS, DEPENDING ON CLINICAL CONDITION.   Reign Dziuba M.D on 08/10/2016 at 12:12 PM  Between 7am to 6pm - Pager - [phone]  After 6pm go to [email or web address] - password [email or web address] [place] [facility]  Office  [phone]  CC: Primary care physician; Leonel Ramsay, MD

## 2016-08-10 NOTE — Care Management Note (Signed)
Case Management Note  Patient Details  Name: Carolyn Valdez MRN: [id number] Date of Birth: [birth year]  Subjective/Objective:     [name] was admitted with CHF and lower extremity edema. Today both legs and arms were very edematous. Lives alone. PCP=Dr Adrian Prows. Pharmacy=[facility]. Home assistive equipment includes a RW and a cane. No home oxygen although she has been on it in the past. Discussed with her that she is very short of breath upon any exertion, and that she may need home oxygen at least temporarily. [name] is currently voicing opposition to the possibility of home oxygen. [name] has a private pay aide who drives her to her appointments, does her grocery shopping, and assists as needed PRN. [name] also has a "cleaning lady" who come every 2 weeks to clean her house. Attempted to discuss home health providers but [name] states that she has all the help she needs. She was getting tired by this time and may not have clearly understood about home health PT and/or RN services. Case management will follow for discharge planning.                Action/Plan:   Expected Discharge Date:  08/11/16               Expected Discharge Plan:     In-House Referral:     Discharge planning Services     Post Acute Care Choice:    Choice offered to:     DME Arranged:    DME Agency:     HH Arranged:    HH Agency:     Status of Service:     If discussed at [employer], dates discussed:    Additional Comments:  Charlsie Fleeger A, RN 08/10/2016, 3:04 PM

## 2016-08-10 NOTE — Consult Note (Signed)
[place] Nurse wound consult note Reason for Consult:Chronic venous stasis need for compression.  Patient is up with PT when [facility] nurse arrives.  Skin assessed and POC discussed with bedside RN who will complete wraps.  Wound type:Intact skin, edematous with chronic skin changes Pressure Ulcer POA: Yes Measurement:Generalized edema from feet to below knee Wound bed:N/A Drainage (amount, consistency, odor)N/A  Periwound:N/A  edematous Dressing procedure/placement/frequency:Cleanse bilateral lower legs with soap and water.  Apply zinc layer, secured with self adherent Coban.  Change weekly. Will not follow at this time.  Please re-consult if needed.  Domenic Moras RN BSN [place] Pager [phone]

## 2016-08-10 NOTE — Progress Notes (Signed)
PT Cancellation Note  Patient Details Name: SHAQUOYA COSPER MRN: [id number] DOB: 15-Oct-1941   Cancelled Treatment:    Reason Eval/Treat Not Completed: Other (comment). Consult received and chart reviewed. Evaluation attempted, however pt eating breakfast and requesting for therapist to return at another time. Will re-attempt, time permitting.   Sanyah Molnar 08/10/2016, 10:10 AM  Greggory Stallion, PT, DPT [phone]

## 2016-08-11 ENCOUNTER — Inpatient Hospital Stay: Payer: Medicare Other

## 2016-08-11 LAB — GLUCOSE, CAPILLARY
GLUCOSE-CAPILLARY: 106 mg/dL — AB (ref 65–99)
GLUCOSE-CAPILLARY: 159 mg/dL — AB (ref 65–99)
GLUCOSE-CAPILLARY: 269 mg/dL — AB (ref 65–99)
Glucose-Capillary: 212 mg/dL — ABNORMAL HIGH (ref 65–99)

## 2016-08-11 LAB — BASIC METABOLIC PANEL
ANION GAP: 6 (ref 5–15)
BUN: 31 mg/dL — ABNORMAL HIGH (ref 6–20)
CALCIUM: 9.3 mg/dL (ref 8.9–10.3)
CO2: 34 mmol/L — ABNORMAL HIGH (ref 22–32)
Chloride: 100 mmol/L — ABNORMAL LOW (ref 101–111)
Creatinine, Ser: 1.3 mg/dL — ABNORMAL HIGH (ref 0.44–1.00)
GFR, EST AFRICAN AMERICAN: 46 mL/min — AB (ref >60)
GFR, EST NON AFRICAN AMERICAN: 40 mL/min — AB (ref >60)
Glucose, Bld: 118 mg/dL — ABNORMAL HIGH (ref 65–99)
POTASSIUM: 4 mmol/L (ref 3.5–5.1)
SODIUM: 140 mmol/L (ref 135–145)

## 2016-08-11 LAB — URINE CULTURE

## 2016-08-11 MED ORDER — FUROSEMIDE 10 MG/ML IJ SOLN
40.0000 mg | Freq: Once | INTRAMUSCULAR | Status: AC
  Administered 2016-08-11: 40 mg via INTRAVENOUS
  Filled 2016-08-11: qty 4

## 2016-08-11 MED ORDER — FUROSEMIDE 40 MG PO TABS
40.0000 mg | ORAL_TABLET | Freq: Two times a day (BID) | ORAL | Status: DC
  Administered 2016-08-11 – 2016-08-12 (×3): 40 mg via ORAL
  Filled 2016-08-11 (×3): qty 1

## 2016-08-11 MED ORDER — LEVOFLOXACIN 750 MG PO TABS
750.0000 mg | ORAL_TABLET | Freq: Every day | ORAL | Status: AC
  Administered 2016-08-12: 750 mg via ORAL
  Filled 2016-08-11: qty 1

## 2016-08-11 NOTE — Consult Note (Signed)
[place] Nurse wound consult note Reason for Consult: Check on Unnas boot placement.  Wraps are clean dry and intact.  Patient asking how long she will require compression.  [place] RN informed her that this is to move excess fluid from her legs and that we are watching her fluid volume and do not want to over load her. To report SOB if that increases or becomes uncomfortable.  Toes are warm to touch.  Patient reports a lot of voiding. Will not follow at this time.  Please re-consult if needed.  Domenic Moras RN BSN [place] Pager [phone]

## 2016-08-11 NOTE — Progress Notes (Signed)
Patient was assisted to bedside commode,patient put her light on staff went in to help patient off commode, patient stated I have to peel again, I will call you when I'm done. Patient put the light on again and staff went in there to help her, patient stated I think I have to go again. Staff encourage patient to put her light on when she's done. Will check on her again.

## 2016-08-11 NOTE — Discharge Instructions (Signed)
Heart Failure Clinic appointment on August 25, 2016 at 11:00am with Darylene Price, [place]. Please call [phone] to reschedule.

## 2016-08-11 NOTE — Progress Notes (Signed)
SATURATION QUALIFICATIONS:08/10/2016 3:42pm  Patient Saturations on Room Air at Rest =81% On room air upon arrival at rest spO2 81%, 1L [place] donned pt. spO2 92%.

## 2016-08-11 NOTE — Progress Notes (Signed)
[place] at [place] NAME: Carolyn Valdez    MR#:  [id number]  DATE OF BIRTH:  Mar 04, 1941  SUBJECTIVE:  CHIEF COMPLAINT:   Chief Complaint  Patient presents with  . Leg Swelling   - Unna boots placed on legs yesterday - still feels dyspneic and needing 2L o2 - complains of increased frequency of urination while on lasix  REVIEW OF SYSTEMS:  Review of Systems  Constitutional: Positive for malaise/fatigue. Negative for chills and fever.  HENT: Negative for ear discharge, ear pain and tinnitus.   Eyes: Negative for blurred vision and double vision.  Respiratory: Positive for shortness of breath. Negative for cough and wheezing.   Cardiovascular: Positive for leg swelling. Negative for chest pain and palpitations.  Gastrointestinal: Negative for abdominal pain, constipation, diarrhea, nausea and vomiting.  Genitourinary: Positive for frequency. Negative for dysuria and urgency.  Musculoskeletal: Positive for back pain.  Neurological: Negative for dizziness, sensory change, speech change, focal weakness, seizures and headaches.  Psychiatric/Behavioral: Negative for depression.    DRUG ALLERGIES:   Allergies  Allergen Reactions  . Sulfa Antibiotics Hives  . Biaxin [Clarithromycin] Hives  . Eggs Or Egg-Derived Products Other (See Comments)    Pt states that her allergy test said she was allergic to eggs.    . Influenza A (H1n1) Monoval Vac Other (See Comments)    Pt states that she was told by her MD not to get the influenza vaccine.    . Morphine Other (See Comments)    Reaction:  Dizziness and confusion   . Pyridium [Phenazopyridine Hcl] Other (See Comments)    Reaction:  Unknown   . Ceftriaxone Anxiety  . Latex Rash  . Prednisone Rash  . Tape Rash    VITALS:  Blood pressure (!) 137/47, pulse (!) 58, temperature 98.4 F (36.9 C), temperature source Oral, resp. rate 15, height 5\' 7"  (1.702 m), weight (!) 144.3 kg (318 lb 1.6 oz),  SpO2 94 %.  PHYSICAL EXAMINATION:  Physical Exam  GENERAL:  75 y.o.-year-old obese patient lying in the bed with no acute distress.  EYES: Pupils equal, round, reactive to light and accommodation. No scleral icterus. Extraocular muscles intact.  HEENT: Head atraumatic, normocephalic. Oropharynx and nasopharynx clear.  NECK:  Supple, no jugular venous distention. No thyroid enlargement, no tenderness.  LUNGS: Normal breath sounds bilaterally, no wheezing, rales,rhonchi or crepitation. No use of accessory muscles of respiration. Fine bibasilar crackles heard CARDIOVASCULAR: S1, S2 normal. No murmurs, rubs, or gallops.  ABDOMEN: Soft, nontender, nondistended. Bowel sounds present. No organomegaly or mass.  EXTREMITIES: 3+ lower extremity edema noted up to the knees. Chronic skin changes noted as well. No cyanosis, or clubbing.  NEUROLOGIC: Cranial nerves II through XII are intact. Muscle strength 5/5 in all extremities. Sensation intact. Gait not checked.  PSYCHIATRIC: The patient is alert and oriented x 3.  SKIN: No obvious rash, lesion, or ulcer.    LABORATORY PANEL:   CBC  Recent Labs Lab 08/10/16 0423  WBC 5.0  HGB 8.9*  HCT 27.4*  PLT 200   ------------------------------------------------------------------------------------------------------------------  Chemistries   Recent Labs Lab 08/09/16 1550  08/11/16 0523  NA 140  < > 140  K 4.2  < > 4.0  CL 102  < > 100*  CO2 29  < > 34*  GLUCOSE 163*  < > 118*  BUN 40*  < > 31*  CREATININE 1.57*  < > 1.30*  CALCIUM 9.1  < >  9.3  AST 21  --   --   ALT 14  --   --   ALKPHOS 64  --   --   BILITOT 0.4  --   --   < > = values in this interval not displayed. ------------------------------------------------------------------------------------------------------------------  Cardiac Enzymes  Recent Labs Lab 08/09/16 1550  TROPONINI <0.03    ------------------------------------------------------------------------------------------------------------------  RADIOLOGY:  Dg Chest Port 1 View  Result Date: 08/09/2016 CLINICAL DATA:  75 y/o  F; hypoxia with history of heart failure. EXAM: PORTABLE CHEST 1 VIEW COMPARISON:  02/23/2016 chest radiograph FINDINGS: Moderate cardiomegaly. Prominent interstitial markings and haziness of lung parenchyma probably represents interstitial pulmonary edema. No consolidation. Bones are unremarkable. Aortic atherosclerosis with arch calcifications. IMPRESSION: Interstitial pulmonary edema and cardiomegaly. Electronically Signed   By: Kristine Garbe M.D.   On: 08/09/2016 17:19    EKG:   Orders placed or performed during the hospital encounter of 02/23/16  . ED EKG  . ED EKG  . EKG    ASSESSMENT AND PLAN:   75 year old female with past medical history of hypertension, diastolic CHF, osteoarthritis, degenerative disc disease, history of breast cancer, history of recurrent UTIs, history of ESBL UTI, anxiety, history of SVT who presents to the hospital due to worsening lower extremity edema and shortness of breath.   1. Acute respiratory failure with hypoxia-secondary to decompensated CHF. -Acute on chronic diastolic CHF exacerbation. Last echo was about a year ago. Repeat echocardiogram with similar findings, EF 55-60%. -Continue Lasix twice a day -follow I's and O's and daily weights. Continue needing 2 L oxygen. Not on any home oxygen- qualify for home o2 - f/u CXR today  2. Lower extremity edema-from CHF and also venous insufficiency. -Continue Lasix. Also wound consult for unna boots  3. Essential HTN - cont. Norvasc, Metoprolol   4. DM - cont. Lantus, SSI and follow BS - hold metformin, Actos.   5. GERD - cont. Protonix.   6. Acute cystitis-though UA was barely positive, patient complains of significant dysuria and frequency of urination. Cultures sent. Significant  h/o ESBL UTIs as well. - for now started Levaquin  7. Urinary Incontinence - cont. Vesicare.   8. DVT prophylaxis-on subcutaneous lovenox   Physical therapy consulted- recommended SNF. Patient wants to go home. Re-evaluate today.   All the records are reviewed and case discussed with Care Management/Social Workerr. Management plans discussed with the patient, family and they are in agreement.  CODE STATUS: Full code  TOTAL TIME TAKING CARE OF THIS PATIENT: 37 minutes.   POSSIBLE D/C IN 1-2 DAYS, DEPENDING ON CLINICAL CONDITION.   Gladstone Lighter M.D on 08/11/2016 at 8:18 AM  Between 7am to 6pm - Pager - [phone]  After 6pm go to [email or web address] - password [email or web address] [place] [facility]  Office  [phone]  CC: Primary care physician; Leonel Ramsay, MD

## 2016-08-11 NOTE — Progress Notes (Signed)
  CONCERNING: Antibiotic IV to Oral Route Change Policy  RECOMMENDATION: This patient is receiving levofloxacin by the intravenous route.  Based on criteria approved by the Pharmacy and Therapeutics Committee, the antibiotic(s) is/are being converted to the equivalent oral dose form(s).   DESCRIPTION: These criteria include:  Patient being treated for a respiratory tract infection, urinary tract infection, cellulitis or clostridium difficile associated diarrhea if on metronidazole  The patient is not neutropenic and does not exhibit a GI malabsorption state  The patient is eating (either orally or via tube) and/or has been taking other orally administered medications for a least 24 hours  The patient is improving clinically and has a Tmax < 100.5  If you have questions about this conversion, please contact the Pharmacy Department  []   [phone] )  [facility] [x]   [phone] )  [facility] []   [phone] )  [facility] []   [phone] )  [facility] []   [phone] )  [place], PharmD Clinical Pharmacist  08/11/2016 11:54 AM

## 2016-08-11 NOTE — Progress Notes (Signed)
Initial HF clinic appointment scheduled for August 25, 2016 at 11:00am. Of note, she has cancelled 2 previously appointments.

## 2016-08-11 NOTE — Progress Notes (Signed)
SATURATION QUALIFICATIONS: (This note is used to comply with regulatory documentation for home oxygen)  Patient Saturations on Room Air at Rest = 76%  Patient Saturations on Room Air while Ambulating = 0%  Patient Saturations on 0 Liters of oxygen while Ambulating = 0%  Please briefly explain why patient needs home oxygen: Patient's oxygen level dropped to 76% on RA at rest.

## 2016-08-11 NOTE — Care Management (Signed)
Patient very tearful when discussing discharge plans.  she is worried about her dog that is currently being boarded during patient's hospital stay.  Physical therapy has recommended skilled nursing.  When discussing this option, patient bursts into tears.  Wants to return home so can be with her pet.  Says she will have someone that will stay with her.  Discussed that for her safety, in home support would need to initially be round the clock.  Discussed possible need for 02 at discharge.  Says this has happened before and only required it for about one week.  Discussed at rest oxygen sats 10/24 of 81% and the strain that puts on cardiopulmonary system.  She is agreeable to home health.  Says would have transportation home.  Discussed the need to keep open mind about skilled nursing if is absolutely needed.  Updated CSW but asked that not speak with patient until after another physical therapy treatment.  Patient would not want to go to [facility] or [facility] if did choose SNF.

## 2016-08-11 NOTE — Progress Notes (Signed)
PT Cancellation Note  Patient Details Name: Carolyn Valdez MRN: [id number] DOB: 1941-05-06   Cancelled Treatment:    Reason Eval/Treat Not Completed: Other (comment). Treatment attempted twice this PM, however first attempt, pt refusing and states she wants to eat lunch and be placed on bed pan. During re-attempt, pt sound asleep. Awoke with minimal verbal cues. Pt very anxious and reports she needs to use the bathroom. BSC set up at bedside, however pt then urinated in bed unable to transfer to [facility]. Requested RN be called for assistance. Pt also confused this date reporting people holding her feet down in bed. Therapist explained nobody was in room, however pt reports "well I just peed on them anyways". Bed alarm off upon arrival, reset at this time. RN notified. Still continuing to recommend SNF placement at this time as pt is not safe to dc home.   Caine Barfield 08/11/2016, 3:34 PM  Greggory Stallion, PT, DPT [phone]

## 2016-08-12 LAB — BASIC METABOLIC PANEL
Anion gap: 6 (ref 5–15)
BUN: 23 mg/dL — AB (ref 6–20)
CHLORIDE: 97 mmol/L — AB (ref 101–111)
CO2: 36 mmol/L — ABNORMAL HIGH (ref 22–32)
Calcium: 9.6 mg/dL (ref 8.9–10.3)
Creatinine, Ser: 1.38 mg/dL — ABNORMAL HIGH (ref 0.44–1.00)
GFR calc non Af Amer: 37 mL/min — ABNORMAL LOW (ref >60)
GFR, EST AFRICAN AMERICAN: 43 mL/min — AB (ref >60)
Glucose, Bld: 198 mg/dL — ABNORMAL HIGH (ref 65–99)
POTASSIUM: 4 mmol/L (ref 3.5–5.1)
SODIUM: 139 mmol/L (ref 135–145)

## 2016-08-12 LAB — GLUCOSE, CAPILLARY
GLUCOSE-CAPILLARY: 172 mg/dL — AB (ref 65–99)
Glucose-Capillary: 217 mg/dL — ABNORMAL HIGH (ref 65–99)
Glucose-Capillary: 212 mg/dL — ABNORMAL HIGH (ref 65–99)

## 2016-08-12 MED ORDER — INSULIN ASPART 100 UNIT/ML ~~LOC~~ SOLN
0.0000 [IU] | Freq: Three times a day (TID) | SUBCUTANEOUS | Status: DC
  Administered 2016-08-12: 5 [IU] via SUBCUTANEOUS
  Filled 2016-08-12: qty 8

## 2016-08-12 MED ORDER — POTASSIUM CHLORIDE CRYS ER 20 MEQ PO TBCR: 20.0000 meq | EXTENDED_RELEASE_TABLET | Freq: Every day | ORAL | 0 refills | Status: DC

## 2016-08-12 MED ORDER — FUROSEMIDE 40 MG PO TABS: 40.0000 mg | ORAL_TABLET | Freq: Two times a day (BID) | ORAL | 0 refills | Status: DC

## 2016-08-12 NOTE — Clinical Social Work Note (Addendum)
MSW received consult from case management that patient has agreed to SNF for short term rehab.  Patient requests [facility], MSW faxed patient's information awaiting bed offers for her.  MSW to continue to follow patient's progress throughout discharge planning.  MSW presented bed offers to patient and she chose [facility].  MSW spoke to [facility] who confirmed they can accept her today.  Patient to be d/c'ed today to [facility].  Patient and family agreeable to plans will transport via ems RN to call report to Theressa Stamps room 505 at [phone].  Jones Broom. Shareese Macha, MSW [phone]  Mon-Fri 8a-4:30p 08/12/2016 11:57 AM

## 2016-08-12 NOTE — NC FL2 (Signed)
[place] LEVEL OF CARE SCREENING TOOL     IDENTIFICATION  Patient Name: Carolyn Valdez: 1941/04/08 Sex: female Admission Date (Current Location): 08/09/2016  [place] and [place] Number:  Engineering geologist and Address:  [facility], [address] [place], [place] [address]      Provider Number: [id number]  Attending Physician Name and Address:  Lytle Butte, MD  Relative Name and Phone Number:  Alain Marion Sister [phone]     Current Level of Care: Hospital Recommended Level of Care: [place] Prior Approval Number:    Date Approved/Denied:   PASRR Number: [id number]  Discharge Plan: SNF    Current Diagnoses: Patient Active Problem List   Diagnosis Date Noted  . CHF (congestive heart failure) ([place]) 08/09/2016  . Congestive heart failure ([place]) 11/25/2015  . Nocturia 11/05/2015  . Urinary frequency 11/05/2015  . Acute respiratory failure with hypoxia ([place]) 09/05/2015  . 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 Incontinence 04/20/2015  . Diabetes mellitus, type 2 ([place]) 04/17/2015  . ESBL (extended spectrum beta-lactamase) producing bacteria infection 04/17/2015  . BP (high blood pressure) 04/17/2015  . Frequent UTI 04/17/2015  . Absence of bladder continence 04/17/2015  . Iron deficiency anemia 03/08/2015  . Absolute anemia 09/25/2014  . Abdominal pain, lower 09/25/2014  . Urge incontinence 02/19/2013  . FOM (frequency of micturition) 02/19/2013  . Bladder infection, chronic 08/11/2012  . Difficult or painful urination 07/26/2012  . Lower urinary tract infection 12/31/2011  . Diabetes mellitus ([place]) 12/31/2011    Orientation RESPIRATION BLADDER Height & Weight     Self, Time, Situation, Place  O2 (2L) Continent Weight: (!) 318 lb 1.6 oz (144.3 kg) Height:  5\' 7"  (170.2 cm)  BEHAVIORAL SYMPTOMS/MOOD NEUROLOGICAL BOWEL NUTRITION STATUS      Continent Diet (Cardiac Diet)  AMBULATORY STATUS  COMMUNICATION OF NEEDS Skin   Limited Assist Verbally Surgical wounds                       Personal Care Assistance Level of Assistance  Bathing, Dressing Bathing Assistance: Limited assistance   Dressing Assistance: Limited assistance     Functional Limitations Info  Sight, Hearing, Speech Sight Info: Adequate Hearing Info: Adequate Speech Info: Adequate    SPECIAL CARE FACTORS FREQUENCY  PT (By licensed PT)     PT Frequency: 5x a week              Contractures Contractures Info: Not present    Additional Factors Info  Allergies, Insulin Sliding Scale, Code Status Code Status Info: Full Code Allergies Info: SULFA ANTIBIOTICS, BIAXIN CLARITHROMYCIN, EGGS OR EGG-DERIVED PRODUCTS, INFLUENZA A (H1N1) MONOVAL VAC, MORPHINE, PYRIDIUM PHENAZOPYRIDINE HCL, CEFTRIAXONE, LATEX, PREDNISONE, TAPE   Insulin Sliding Scale Info: insulin aspart (novoLOG) injection 0-15 Units Freq: 3 times daily with meals Route: [place]       Current Medications (08/12/2016):  This is the current hospital active medication list Current Facility-Administered Medications  Medication Dose Route Frequency Provider Last Rate Last Dose  . acetaminophen (TYLENOL) tablet 650 mg  650 mg Oral Q6H PRN Henreitta Leber, MD   650 mg at 08/12/16 0602   Or  . acetaminophen (TYLENOL) suppository 650 mg  650 mg Rectal Q6H PRN Henreitta Leber, MD      . amLODipine (NORVASC) tablet 5 mg  5 mg Oral Daily Lytle Butte, MD   5 mg at 08/12/16 0831  . atorvastatin (LIPITOR) tablet 20 mg  20 mg Oral QHS Lytle Butte, MD   20 mg at 08/11/16 2236  . clonazePAM (KLONOPIN) tablet 1 mg  1 mg Oral TID Lytle Butte, MD   1 mg at 08/12/16 0831  . darifenacin (ENABLEX) 24 hr tablet 7.5 mg  7.5 mg Oral Daily Lytle Butte, MD   7.5 mg at 08/11/16 2237  . desmopressin (DDAVP) tablet 0.2 mg  0.2 mg Oral QHS Lytle Butte, MD   0.2 mg at 08/11/16 2237  . docusate sodium (COLACE) capsule 200 mg  200 mg Oral BID Lytle Butte, MD    200 mg at 08/12/16 3086  . enoxaparin (LOVENOX) injection 40 mg  40 mg Subcutaneous Q12H Gladstone Lighter, MD   40 mg at 08/12/16 0830  . furosemide (LASIX) tablet 40 mg  40 mg Oral BID Gladstone Lighter, MD   40 mg at 08/12/16 5784  . gabapentin (NEURONTIN) capsule 300 mg  300 mg Oral Q lunch Lytle Butte, MD   300 mg at 08/11/16 1202  . gabapentin (NEURONTIN) capsule 600 mg  600 mg Oral BID Lytle Butte, MD   600 mg at 08/12/16 0831  . hydrOXYzine (ATARAX/VISTARIL) tablet 25 mg  25 mg Oral QHS Lytle Butte, MD   25 mg at 08/11/16 2237  . insulin aspart (novoLOG) injection 0-15 Units  0-15 Units Subcutaneous TID WC Lytle Butte, MD      . insulin aspart (novoLOG) injection 0-5 Units  0-5 Units Subcutaneous QHS Henreitta Leber, MD   3 Units at 08/11/16 2238  . insulin glargine (LANTUS) injection 50 Units  50 Units Subcutaneous QHS Lytle Butte, MD   50 Units at 08/11/16 2239  . iron polysaccharides (NIFEREX) capsule 150 mg  150 mg Oral Daily Lytle Butte, MD   150 mg at 08/12/16 0830  . loratadine (CLARITIN) tablet 10 mg  10 mg Oral Daily Lytle Butte, MD   10 mg at 08/11/16 6962  . MEDLINE mouth rinse  15 mL Mouth Rinse BID Henreitta Leber, MD   15 mL at 08/12/16 9528  . metoprolol succinate (TOPROL-XL) 24 hr tablet 25 mg  25 mg Oral BID Gladstone Lighter, MD   25 mg at 08/12/16 0831  . mirabegron ER (MYRBETRIQ) tablet 50 mg  50 mg Oral QPM Lytle Butte, MD   50 mg at 08/11/16 1749  . ondansetron (ZOFRAN) tablet 4 mg  4 mg Oral Q6H PRN Henreitta Leber, MD       Or  . ondansetron (ZOFRAN) injection 4 mg  4 mg Intravenous Q6H PRN Henreitta Leber, MD      . pantoprazole (PROTONIX) EC tablet 40 mg  40 mg Oral Daily Lytle Butte, MD   40 mg at 08/12/16 0831  . potassium chloride SA (K-DUR,KLOR-CON) CR tablet 20 mEq  20 mEq Oral Daily Gladstone Lighter, MD   20 mEq at 08/11/16 0927  . sodium chloride flush (NS) 0.9 % injection 3 mL  3 mL Intravenous Q12H Henreitta Leber, MD   3 mL at 08/11/16  2239  . vitamin E capsule 1,000 Units  1,000 Units Oral Daily Lytle Butte, MD   1,000 Units at 08/12/16 0830     Discharge Medications: Please see discharge summary for a list of discharge medications.  Relevant Imaging Results:  Relevant Lab Results:   Additional Information SSN [id number]  Ross Ludwig

## 2016-08-12 NOTE — Progress Notes (Signed)
Discharge instructions placed in patient packet. IV and tele removed. Report called to [facility]. EMS called and patient awaiting transport at this time and in no acute distress.

## 2016-08-12 NOTE — Care Management (Signed)
Discussed patient's adamant declining pursuing skilled nursing placement with attending. A family member has called this morning to address concern that patient does not have her friend to stay with her at night due to car trouble.  Patient becomes upset when this concern addressed.  Nurse and CM discussed with patient that all verbalized concerns are well intended.  She does have medi alert and has had meals on wheels but the food is "awful."  Discussed if she goes home will arranged for SN PT Aide OT and SW. Stressed that if she goes home and changes her mind about facility placement, then the placement would be arranged from home.  Return to hospital for facility placement does not meet medical necessity. She has qualified for home 02.  Notified [facility] of referral and the high concern that patient will have issues managing at home on her own. Discussed that if patient changes her mind about facility placement- to place patient from home- this the need for social work and identification of community resources to assist patient.  Patient is competent to make her own decisions

## 2016-08-12 NOTE — Progress Notes (Signed)
Spoke with Tye Maryland, [facility] rep at [phone], to notify of non-emergent EMS transport.  Auth notification reference given as [id number].   Service date range good for 90 days starting from 08/12/16.   Geographical gap exception requested to determine if services can be considered at an in-network level.

## 2016-08-12 NOTE — Care Management (Signed)
Patient has informed the CM that if she could get a bed offer from [facility], would go for one week.  Notified CSW and [facility]

## 2016-08-12 NOTE — Clinical Social Work Note (Signed)
Clinical Social Work Assessment  Patient Details  Name: Carolyn Valdez MRN: [id number] Date of Birth: 1941/07/26  Date of referral:  08/12/16               Reason for consult:  Facility Placement                Permission sought to share information with:  Family Supports, Customer service manager Permission granted to share information::  Yes, Verbal Permission Granted  Name::     Alain Marion Sister [phone]   Agency::  SNF admissions  Relationship::     Contact Information:     Housing/Transportation Living arrangements for the past 2 months:  Single Family Home Source of Information:  Patient Patient Interpreter Needed:  None Criminal Activity/Legal Involvement Pertinent to Current Situation/Hospitalization:  No - Comment as needed Significant Relationships:  Siblings Lives with:  Self Do you feel safe going back to the place where you live?  No Need for family participation in patient care:  No (Coment)  Care giving concerns:  Patient at first felt like she can go home, but then decided to go to SNF for short term rehab.   Social Worker assessment / plan:  Patient is a 75 year old female who lives alone and is alert and oriented x4.  Patient states she has been to rehab in the past and was hesitant about returning.  MSW explained to patient there are other facilities available so she can go to a different one.  Patient thought about and decided she will agree to go to SNF for short term rehab.  Patient was familiar with the process and what to expect at SNF.  MSW explained to patient how her insurance will pay for SNF placement.  MSW explained to patient role and process for SNF placement.  Patient asked appropriate questions and she felt like her answers were answered to her satisfaction.  Employment status:  Retired Nurse, adult PT Recommendations:  [place] / Referral to community resources:  [place]  Patient/Family's Response to care:  Patient in agreement to going to SNF for short term rehab.  Patient/Family's Understanding of and Emotional Response to Diagnosis, Current Treatment, and Prognosis:  Patient aware of current treatment plan and prognosis.  Patient expressed that she would rather go home, but is hopeful that she will not have to be at [facility] for a long period of time.  Emotional Assessment Appearance:  Appears stated age Attitude/Demeanor/Rapport:    Affect (typically observed):  Appropriate, Calm, Stable Orientation:  Oriented to Self, Oriented to Place, Oriented to  Time, Oriented to Situation Alcohol / Substance use:  Not Applicable Psych involvement (Current and /or in the community):  No (Comment)  Discharge Needs  Concerns to be addressed:  Lack of Support Readmission within the last 30 days:  No Current discharge risk:  Lives alone Barriers to Discharge:  No Barriers Identified   Ross Ludwig 08/12/2016, 3:12 PM

## 2016-08-12 NOTE — Discharge Summary (Addendum)
[facility] [place] NAME: Carolyn Valdez    MR#:  [id number]  DATE OF BIRTH:  24-Jan-1941  DATE OF ADMISSION:  08/09/2016 ADMITTING PHYSICIAN: Henreitta Leber, MD  DATE OF DISCHARGE: 08/12/16  PRIMARY CARE PHYSICIAN: Leonel Ramsay, MD    ADMISSION DIAGNOSIS:  Acute pulmonary edema ([place]) [J81.0] Leg edema [R60.0]  DISCHARGE DIAGNOSIS:  Acute on chronic diastolic congestive heart failure  SECONDARY DIAGNOSIS:   Past Medical History:  Diagnosis Date  . Anxiety   . Breast cancer ([place])    16 yrs ago  . CHF (congestive heart failure) ([place])   . Diabetes mellitus without complication ([place])   . DJD (degenerative joint disease)   . Dysuria   . H/O total knee replacement    right  . HTN (hypertension)   . Hypercholesteremia   . Kidney stone   . Obesity   . Recurrent urinary tract infection   . SVT (supraventricular tachycardia) ([place])   . Urinary incontinence   . Vaginal atrophy   . Yeast vaginitis     HOSPITAL COURSE:  Carolyn Valdez  is a 75 y.o. female admitted 08/09/2016 with chief complaint Leg Swelling . Please see H&P performed by Henreitta Leber, MD for further information. Patient presented with the above symptoms consistent with heart failure exacerbation. She received diuresis with some improvement, wound care for leg dressing. She has been evaluated by physical therapy who recommended SNF but she declined.  She will be discharge on increased dosage of lasix to follow with her PCP and HF clinic Will be discharge   DISCHARGE CONDITIONS:   stable  CONSULTS OBTAINED:    DRUG ALLERGIES:   Allergies  Allergen Reactions  . Sulfa Antibiotics Hives  . Biaxin [Clarithromycin] Hives  . Eggs Or Egg-Derived Products Other (See Comments)    Pt states that her allergy test said she was allergic to eggs.    . Influenza A (H1n1) Monoval Vac Other (See Comments)    Pt states that she was told by her MD not to get the influenza  vaccine.    . Morphine Other (See Comments)    Reaction:  Dizziness and confusion   . Pyridium [Phenazopyridine Hcl] Other (See Comments)    Reaction:  Unknown   . Ceftriaxone Anxiety  . Latex Rash  . Prednisone Rash  . Tape Rash    DISCHARGE MEDICATIONS:   Current Discharge Medication List    START taking these medications   Details  potassium chloride SA (K-DUR,KLOR-CON) 20 MEQ tablet Take 1 tablet (20 mEq total) by mouth daily. Qty: 30 tablet, Refills: 0      CONTINUE these medications which have CHANGED   Details  furosemide (LASIX) 40 MG tablet Take 1 tablet (40 mg total) by mouth 2 (two) times daily. Qty: 60 tablet, Refills: 0      CONTINUE these medications which have NOT CHANGED   Details  amLODipine (NORVASC) 5 MG tablet Take 5 mg by mouth daily.    atorvastatin (LIPITOR) 20 MG tablet Take 20 mg by mouth at bedtime.    clonazePAM (KLONOPIN) 1 MG tablet Take 1 mg by mouth 3 (three) times daily.    conjugated estrogens (PREMARIN) vaginal cream Place 1 Applicatorful vaginally 3 (three) times a week. Pt uses on Monday, Wednesday, and Friday.    desmopressin (DDAVP) 0.2 MG tablet Take 1 tablet (0.2 mg total) by mouth at bedtime. Qty: 90 tablet, Refills: 3   Associated  Diagnoses: Nocturia    docusate sodium (COLACE) 100 MG capsule Take 200 mg by mouth 2 (two) times daily.    fexofenadine (ALLEGRA) 180 MG tablet Take 180 mg by mouth daily.    gabapentin (NEURONTIN) 300 MG capsule Take 300-600 mg by mouth 3 (three) times daily. Pt takes two capsules in the morning, one capsule at lunch, and two capsules in the evening.    hydrOXYzine (ATARAX/VISTARIL) 25 MG tablet Take 25 mg by mouth at bedtime.     insulin glargine (LANTUS) 100 UNIT/ML injection Inject 50 Units into the skin at bedtime.    iron polysaccharides (NIFEREX) 150 MG capsule Take 150 mg by mouth 3 (three) times daily.     losartan (COZAAR) 100 MG tablet Take 100 mg by mouth daily.    metFORMIN  (GLUCOPHAGE) 1000 MG tablet Take 1,000 mg by mouth 2 (two) times daily with a meal.     metoprolol succinate (TOPROL-XL) 100 MG 24 hr tablet Take 100 mg by mouth 2 (two) times daily.     omeprazole (PRILOSEC) 40 MG capsule Take 40 mg by mouth 2 (two) times daily.    pioglitazone (ACTOS) 30 MG tablet Take 30 mg by mouth daily.    VESICARE 10 MG tablet Take 1 tablet (10 mg total) by mouth daily. Qty: 90 tablet, Refills: 3   Associated Diagnoses: Nocturia    vitamin E 1000 UNIT capsule Take 1,000 Units by mouth daily.      STOP taking these medications     mirabegron ER (MYRBETRIQ) 50 MG TB24 tablet          DISCHARGE INSTRUCTIONS:  Wound care: Cleanse bilateral lower legs with soap and water.  Apply zinc layer, secured with self adherent Coban.  Change weekly.  DIET:  Cardiac diet  DISCHARGE CONDITION:  Stable  ACTIVITY:  Activity as tolerated  OXYGEN:  Home Oxygen: Yes.     Oxygen Delivery: 2 liters/min via Patient connected to nasal cannula oxygen  DISCHARGE LOCATION:  snf  If you experience worsening of your admission symptoms, develop shortness of breath, life threatening emergency, suicidal or homicidal thoughts you must seek medical attention immediately by calling 911 or calling your MD immediately  if symptoms less severe.  You Must read complete instructions/literature along with all the possible adverse reactions/side effects for all the Medicines you take and that have been prescribed to you. Take any new Medicines after you have completely understood and accpet all the possible adverse reactions/side effects.   Please note  You were cared for by a hospitalist during your hospital stay. If you have any questions about your discharge medications or the care you received while you were in the hospital after you are discharged, you can call the unit and asked to speak with the hospitalist on call if the hospitalist that took care of you is not available. Once  you are discharged, your primary care physician will handle any further medical issues. Please note that NO REFILLS for any discharge medications will be authorized once you are discharged, as it is imperative that you return to your primary care physician (or establish a relationship with a primary care physician if you do not have one) for your aftercare needs so that they can reassess your need for medications and monitor your lab values.    On the day of Discharge:   VITAL SIGNS:  Blood pressure (!) 158/61, pulse (!) 58, temperature 98.2 F (36.8 C), temperature source Oral, resp. rate 16, height  5\' 7"  (1.702 m), weight (!) 144.3 kg (318 lb 1.6 oz), SpO2 100 %.  I/O:   Intake/Output Summary (Last 24 hours) at 08/12/16 1018 Last data filed at 08/12/16 0925  Gross per 24 hour  Intake              240 ml  Output             4225 ml  Net            -3985 ml    PHYSICAL EXAMINATION:  GENERAL:  75 y.o.-year-old patient lying in the bed with no acute distress.  EYES: Pupils equal, round, reactive to light and accommodation. No scleral icterus. Extraocular muscles intact.  HEENT: Head atraumatic, normocephalic. Oropharynx and nasopharynx clear.  NECK:  Supple, no jugular venous distention. No thyroid enlargement, no tenderness.  LUNGS: Normal breath sounds bilaterally, no wheezing, rales,rhonchi or crepitation. No use of accessory muscles of respiration.  CARDIOVASCULAR: S1, S2 normal. No murmurs, rubs, or gallops.  ABDOMEN: Soft, non-tender, non-distended. Bowel sounds present. No organomegaly or mass.  EXTREMITIES: 1+ edema, improved from admission, cyanosis, or clubbing.  NEUROLOGIC: Cranial nerves II through XII are intact. Muscle strength 5/5 in all extremities. Sensation intact. Gait not checked.  PSYCHIATRIC: The patient is alert and oriented x 3.  SKIN: legs currently wrapper, No obvious rash, lesion, or ulcer.   DATA REVIEW:   CBC  Recent Labs Lab 08/10/16 0423  WBC 5.0    HGB 8.9*  HCT 27.4*  PLT 200    Chemistries   Recent Labs Lab 08/09/16 1550  08/12/16 0418  NA 140  < > 139  K 4.2  < > 4.0  CL 102  < > 97*  CO2 29  < > 36*  GLUCOSE 163*  < > 198*  BUN 40*  < > 23*  CREATININE 1.57*  < > 1.38*  CALCIUM 9.1  < > 9.6  AST 21  --   --   ALT 14  --   --   ALKPHOS 64  --   --   BILITOT 0.4  --   --   < > = values in this interval not displayed.  Cardiac Enzymes  Recent Labs Lab 08/09/16 1550  TROPONINI <0.03    Microbiology Results  Results for orders placed or performed during the hospital encounter of 08/09/16  Urine culture     Status: Abnormal   Collection Time: 08/09/16  4:55 PM  Result Value Ref Range Status   Specimen Description URINE, RANDOM  Final   Special Requests NONE  Final   Culture MULTIPLE SPECIES PRESENT, SUGGEST RECOLLECTION (A)  Final   Report Status 08/11/2016 FINAL  Final    RADIOLOGY:  Dg Chest 2 View  Result Date: 08/11/2016 CLINICAL DATA:  CHF, hypertension, diabetes EXAM: CHEST  2 VIEW COMPARISON:  08/09/2016 FINDINGS: Limited exam because of obese body habitus and sitting position during the AP film. Cardiomegaly evident with similar vascular congestion and diffuse interstitial pattern compatible with mild edema. Lung bases are obscured by overlying soft tissues. No enlarging effusion. Negative for pneumothorax. Trachea is midline. Atherosclerosis noted of the aorta. Degenerative changes of the spine and shoulders. IMPRESSION: Stable cardiomegaly with diffuse interstitial edema pattern. No significant interval change. Electronically Signed   By: Jerilynn Mages.  Shick M.D.   On: 08/11/2016 08:48     Management plans discussed with the patient, family and they are in agreement.  CODE STATUS:     Code Status  Orders        Start     Ordered   08/09/16 2028  Full code  Continuous     08/09/16 2027    Code Status History    Date Active Date Inactive Code Status Order ID Comments User Context   09/30/2015   2:23 PM 10/07/2015  5:37 PM Full Code [id number]  Loletha Grayer, MD ED   09/05/2015  5:56 PM 09/09/2015  5:38 PM DNR [id number]  Loletha Grayer, MD ED      TOTAL TIME TAKING CARE OF THIS PATIENT: 33 minutes.    Falicity Sheets,  Karenann Cai.D on 08/12/2016 at 10:18 AM  Between 7am to 6pm - Pager - [phone]  After 6pm go to [email or web address] - Technical brewer [facility]  Office  [phone]  CC: Primary care physician; Leonel Ramsay, MD

## 2016-08-12 NOTE — Clinical Social Work Placement (Signed)
   CLINICAL SOCIAL WORK PLACEMENT  NOTE  Date:  08/12/2016  Patient Details  Name: Carolyn Valdez MRN: [id number] Date of Birth: 1941/05/17  Clinical Social Work is seeking post-discharge placement for this patient at the [place] level of care (*CSW will initial, date and re-position this form in  chart as items are completed):  Yes   Patient/family provided with [facility] Work Department's list of facilities offering this level of care within the geographic area requested by the patient (or if unable, by the patient's family).  Yes   Patient/family informed of their freedom to choose among providers that offer the needed level of care, that participate in Medicare, Medicaid or managed care program needed by the patient, have an available bed and are willing to accept the patient.  Yes   Patient/family informed of [facility]'s ownership interest in [facility] and [facility], as well as of the fact that they are under no obligation to receive care at these facilities.  PASRR submitted to EDS on 08/12/16     PASRR number received on       Existing PASRR number confirmed on 08/12/16     FL2 transmitted to all facilities in geographic area requested by pt/family on 08/12/16     FL2 transmitted to all facilities within larger geographic area on       Patient informed that his/her managed care company has contracts with or will negotiate with certain facilities, including the following:        Yes   Patient/family informed of bed offers received.  Patient chooses bed at [facility]     Physician recommends and patient chooses bed at      Patient to be transferred to [facility] on 08/12/16.  Patient to be transferred to facility by [facility]     Patient family notified on 08/12/16 of transfer.  Name of family member notified:  Patient notified her sister     PHYSICIAN       Additional Comment:     _______________________________________________ Ross Ludwig 08/12/2016, 3:26 PM

## 2016-08-12 NOTE — Progress Notes (Signed)
Inpatient Diabetes Program Recommendations  AACE/ADA: New Consensus Statement on Inpatient Glycemic Control (2015)  Target Ranges:  Prepandial:   less than 140 mg/dL      [facility] postprandial:   less than 180 mg/dL (1-2 hours)      Critically ill patients:  140 - 180 mg/dL   Lab Results  Component Value Date   GLUCAP 172 (H) 08/12/2016   HGBA1C 6.2 09/04/2014    Review of Glycemic Control  Results for Carolyn Valdez, Carolyn Valdez (MRN [id number]) as of 08/12/2016 08:11  Ref. Range 08/11/2016 08:03 08/11/2016 12:04 08/11/2016 17:08 08/11/2016 20:48 08/12/2016 07:26  Glucose-Capillary Latest Ref Range: 65 - 99 mg/dL 106 (H) 212 (H) 159 (H) 269 (H) 172 (H)     Diabetes history: Type 2 Outpatient Diabetes medications: Lantus 50 units qhs, Metformin 1000mg  bid, Actos 30 mg qday  Current orders for Inpatient glycemic control: Novolog 0-9 units tid, Novlog 0-5 units qhs, Lantus 50 units qhs  Inpatient Diabetes Program Recommendations:    Per ADA recommendations "consider performing an A1C on all patients with diabetes or hyperglycemia admitted to the hospital if not performed in the prior 3 months".  Please change diet to heart healthy/ carb modified  Based on this patient's weight and the CBG elevation post meals, please consider increasing correction insulin to moderate correction scale 0-15 units tid with meals.  Gentry Fitz, RN, BA, MHA, CDE Diabetes Coordinator Inpatient Diabetes Program  [phone] (Team Pager) [phone] ([place]) 08/12/2016 8:14 AM

## 2016-08-13 ENCOUNTER — Other Ambulatory Visit
Admission: RE | Admit: 2016-08-13 | Discharge: 2016-08-13 | Disposition: A | Discharge status: Home / Self Care | Payer: Medicare Other | Source: Ambulatory Visit | Attending: Family Medicine | Admitting: Family Medicine

## 2016-08-13 DIAGNOSIS — R05 Cough: Secondary | ICD-10-CM | POA: Insufficient documentation

## 2016-08-13 LAB — HEMOGLOBIN A1C
Hgb A1c MFr Bld: 6.8 % — ABNORMAL HIGH (ref 4.8–5.6)
Mean Plasma Glucose: 148 mg/dL

## 2016-08-16 ENCOUNTER — Encounter (INDEPENDENT_AMBULATORY_CARE_PROVIDER_SITE_OTHER): Payer: Self-pay | Admitting: Vascular Surgery

## 2016-08-16 ENCOUNTER — Ambulatory Visit (INDEPENDENT_AMBULATORY_CARE_PROVIDER_SITE_OTHER): Payer: Medicare Other | Admitting: Vascular Surgery

## 2016-08-16 VITALS — BP 147/64 | HR 66 | Resp 16

## 2016-08-16 DIAGNOSIS — I509 Heart failure, unspecified: Secondary | ICD-10-CM

## 2016-08-16 DIAGNOSIS — I890 Lymphedema, not elsewhere classified: Secondary | ICD-10-CM | POA: Insufficient documentation

## 2016-08-16 DIAGNOSIS — I10 Essential (primary) hypertension: Secondary | ICD-10-CM

## 2016-08-16 LAB — LEGIONELLA PNEUMOPHILA SEROGP 1 UR AG: L. PNEUMOPHILA SEROGP 1 UR AG: NEGATIVE

## 2016-08-16 NOTE — Progress Notes (Signed)
Subjective:    Patient ID: Carolyn Valdez, female    DOB: 01/21/1941, 75 y.o.   MRN: [id number] Chief Complaint  Patient presents with  . Follow-up   Patient presents at request of nursing home. Patient has been undergoing weekly bilateral unna wraps for lymphedema exacerbation. She presents today to assess if they can be removed. Patient reports an improvement in the "swelling" in both of her legs. She does not have compression stockings. Denies any ulcer formation.    Review of Systems  Constitutional: Negative.   HENT: Negative.   Eyes: Negative.   Respiratory: Negative.   Cardiovascular: Positive for leg swelling.  Gastrointestinal: Negative.   Endocrine: Negative.   Genitourinary: Negative.   Musculoskeletal: Negative.   Skin: Negative.   Allergic/Immunologic: Negative.   Neurological: Negative.   Hematological: Negative.   Psychiatric/Behavioral: Negative.        Objective:   Physical Exam  Constitutional: She is oriented to person, place, and time. She appears well-developed and well-nourished.  Obese. Wheelchair bound.   HENT:  Head: Normocephalic and atraumatic.  Eyes: Conjunctivae and EOM are normal. Pupils are equal, round, and reactive to light.  Neck: Normal range of motion.  Cardiovascular: Normal rate, regular rhythm, normal heart sounds and intact distal pulses.   Pulses:      Radial pulses are 2+ on the right side, and 2+ on the left side.       Dorsalis pedis pulses are 1+ on the right side, and 1+ on the left side.       Posterior tibial pulses are 1+ on the right side, and 1+ on the left side.  Pulmonary/Chest: Effort normal and breath sounds normal.  Abdominal: Soft. Bowel sounds are normal.  Musculoskeletal: Normal range of motion. She exhibits edema ([place] Moderate Edema. No ulceration. No cellulitis. ).  Neurological: She is alert and oriented to person, place, and time.  Skin: Skin is warm and dry. No rash noted. No erythema.  Psychiatric: She  has a normal mood and affect. Her behavior is normal. Judgment and thought content normal.   BP (!) 147/64   Pulse 66   Resp 16   Past Medical History:  Diagnosis Date  . Anxiety   . Breast cancer ([place])    16 yrs ago  . CHF (congestive heart failure) ([place])   . Diabetes mellitus without complication ([place])   . DJD (degenerative joint disease)   . Dysuria   . H/O total knee replacement    right  . HTN (hypertension)   . Hypercholesteremia   . Kidney stone   . Obesity   . Recurrent urinary tract infection   . SVT (supraventricular tachycardia) ([place])   . Urinary incontinence   . Vaginal atrophy   . Yeast vaginitis    Social History   Social History  . Marital status: Widowed    Spouse name: N/A  . Number of children: N/A  . Years of education: N/A   Occupational History  . Not on file.   Social History Main Topics  . Smoking status: Former Research scientist (life sciences)  . Smokeless tobacco: Former Systems developer    Quit date: 10/29/1995  . Alcohol use No  . Drug use: No  . Sexual activity: Not on file   Other Topics Concern  . Not on file   Social History Narrative  . No narrative on file   Past Surgical History:  Procedure Laterality Date  . CARDIAC CATHETERIZATION    . CHOLECYSTECTOMY    .  FOOT SURGERY    . JOINT REPLACEMENT    . MASTECTOMY Right 1997  . NASAL SEPTUM SURGERY    . PERIPHERAL VASCULAR CATHETERIZATION N/A 07/08/2015   Procedure: PICC Line Insertion;  Surgeon: Algernon Huxley, MD;  Location: [facility];  Service: Cardiovascular;  Laterality: N/A;  . TONSILLECTOMY    . Vocal cords     Family History  Problem Relation Age of Onset  . Lung cancer Father   . Hematuria Mother   . Lung cancer Mother   . Kidney disease Neg Hx   . Bladder Cancer Neg Hx   . Breast cancer Neg Hx    Allergies  Allergen Reactions  . Sulfa Antibiotics Hives  . Biaxin [Clarithromycin] Hives  . Eggs Or Egg-Derived Products Other (See Comments)    Pt states that her allergy test said she  was allergic to eggs.    . Influenza A (H1n1) Monoval Vac Other (See Comments)    Pt states that she was told by her MD not to get the influenza vaccine.    . Morphine Other (See Comments)    Reaction:  Dizziness and confusion   . Pyridium [Phenazopyridine Hcl] Other (See Comments)    Reaction:  Unknown   . Ceftriaxone Anxiety  . Latex Rash  . Prednisone Rash  . Tape Rash      Assessment & Plan:  Patient presents at request of nursing home. Patient has been undergoing weekly bilateral unna wraps for lymphedema exacerbation. She presents today to assess if they can be removed. Patient reports an improvement in the "swelling" in both of her legs. She does not have compression stockings. Denies any ulcer formation.   1. Lymphedema - Improved The patient was encouraged to wear graduated compression stockings (20-30 mmHg) on a daily basis. The patient was instructed to begin wearing the stockings first thing in the morning and removing them in the evening. The patient was instructed specifically not to sleep in the stockings. Prescription given. In addition, behavioral modification including elevation during the day will be initiated. We discussed a lymphedema pump if conventional therapy goes not work. The patient was advised to follow up in three months after wearing her compression stockings daily with elevation. Information on compression stockings, lymphedema and the lymphedema pump was given to the patient. The patient was instructed to call the office in the interim if any worsening edema or ulcerations to the legs, feet or toes occurs. The patient expresses their understanding.  2. Hypertension, unspecified type - Stable Encouraged good control as its slows the progression of atherosclerotic disease  3. Congestive heart failure, unspecified congestive heart failure chronicity, unspecified congestive heart failure type ([place]) - Stable Can also contribute to edema in her lower extremity.  Good control will help control this.   Current Outpatient Prescriptions on File Prior to Visit  Medication Sig Dispense Refill  . amLODipine (NORVASC) 5 MG tablet Take 5 mg by mouth daily.    Marland Kitchen atorvastatin (LIPITOR) 20 MG tablet Take 20 mg by mouth at bedtime.    . clonazePAM (KLONOPIN) 1 MG tablet Take 1 mg by mouth 3 (three) times daily.    Marland Kitchen conjugated estrogens (PREMARIN) vaginal cream Place 1 Applicatorful vaginally 3 (three) times a week. Pt uses on Monday, Wednesday, and Friday.    . desmopressin (DDAVP) 0.2 MG tablet Take 1 tablet (0.2 mg total) by mouth at bedtime. 90 tablet 3  . docusate sodium (COLACE) 100 MG capsule Take 200 mg  by mouth 2 (two) times daily.    . fexofenadine (ALLEGRA) 180 MG tablet Take 180 mg by mouth daily.    . furosemide (LASIX) 40 MG tablet Take 1 tablet (40 mg total) by mouth 2 (two) times daily. 60 tablet 0  . gabapentin (NEURONTIN) 300 MG capsule Take 300-600 mg by mouth 3 (three) times daily. Pt takes two capsules in the morning, one capsule at lunch, and two capsules in the evening.    . hydrOXYzine (ATARAX/VISTARIL) 25 MG tablet Take 25 mg by mouth at bedtime.     . insulin glargine (LANTUS) 100 UNIT/ML injection Inject 50 Units into the skin at bedtime.    . iron polysaccharides (NIFEREX) 150 MG capsule Take 150 mg by mouth 3 (three) times daily.     Marland Kitchen losartan (COZAAR) 100 MG tablet Take 100 mg by mouth daily.    . metFORMIN (GLUCOPHAGE) 1000 MG tablet Take 1,000 mg by mouth 2 (two) times daily with a meal.     . metoprolol succinate (TOPROL-XL) 100 MG 24 hr tablet Take 100 mg by mouth 2 (two) times daily.     Marland Kitchen omeprazole (PRILOSEC) 40 MG capsule Take 40 mg by mouth 2 (two) times daily.    . pioglitazone (ACTOS) 30 MG tablet Take 30 mg by mouth daily.    . potassium chloride SA (K-DUR,KLOR-CON) 20 MEQ tablet Take 1 tablet (20 mEq total) by mouth daily. 30 tablet 0  . VESICARE 10 MG tablet Take 1 tablet (10 mg total) by mouth daily. 90 tablet 3  .  vitamin E 1000 UNIT capsule Take 1,000 Units by mouth daily.     No current facility-administered medications on file prior to visit.     There are no Patient Instructions on file for this visit. Return in about 3 months (around 11/16/2016) for Lymphedema Check.   Tannon Peerson A Kally Cadden, PA-C

## 2016-08-25 ENCOUNTER — Ambulatory Visit: Payer: Medicare Other | Admitting: Family

## 2016-08-25 ENCOUNTER — Telehealth: Payer: Self-pay | Admitting: Family

## 2016-08-25 NOTE — Telephone Encounter (Signed)
Patient missed her initial appointment at the [facility] on 08/25/16. Will attempt to reschedule.

## 2016-08-31 ENCOUNTER — Encounter: Payer: Self-pay | Admitting: Emergency Medicine

## 2016-08-31 ENCOUNTER — Emergency Department: Payer: Medicare Other

## 2016-08-31 ENCOUNTER — Inpatient Hospital Stay
Admission: EM | Admit: 2016-08-31 | Discharge: 2016-09-05 | DRG: 291 | Disposition: A | Discharge status: Home Health | Payer: Medicare Other | Attending: Internal Medicine | Admitting: Internal Medicine

## 2016-08-31 DIAGNOSIS — R0902 Hypoxemia: Secondary | ICD-10-CM

## 2016-08-31 DIAGNOSIS — I130 Hypertensive heart and chronic kidney disease with heart failure and stage 1 through stage 4 chronic kidney disease, or unspecified chronic kidney disease: Principal | ICD-10-CM | POA: Diagnosis present

## 2016-08-31 DIAGNOSIS — J9601 Acute respiratory failure with hypoxia: Secondary | ICD-10-CM | POA: Diagnosis present

## 2016-08-31 DIAGNOSIS — Z87891 Personal history of nicotine dependence: Secondary | ICD-10-CM | POA: Diagnosis not present

## 2016-08-31 DIAGNOSIS — Z794 Long term (current) use of insulin: Secondary | ICD-10-CM

## 2016-08-31 DIAGNOSIS — N390 Urinary tract infection, site not specified: Secondary | ICD-10-CM

## 2016-08-31 DIAGNOSIS — Z887 Allergy status to serum and vaccine status: Secondary | ICD-10-CM | POA: Diagnosis not present

## 2016-08-31 DIAGNOSIS — K219 Gastro-esophageal reflux disease without esophagitis: Secondary | ICD-10-CM | POA: Diagnosis not present

## 2016-08-31 DIAGNOSIS — Z87440 Personal history of urinary (tract) infections: Secondary | ICD-10-CM | POA: Diagnosis not present

## 2016-08-31 DIAGNOSIS — Z6841 Body Mass Index (BMI) 40.0 and over, adult: Secondary | ICD-10-CM

## 2016-08-31 DIAGNOSIS — B9562 Methicillin resistant Staphylococcus aureus infection as the cause of diseases classified elsewhere: Secondary | ICD-10-CM | POA: Diagnosis present

## 2016-08-31 DIAGNOSIS — Z853 Personal history of malignant neoplasm of breast: Secondary | ICD-10-CM

## 2016-08-31 DIAGNOSIS — N179 Acute kidney failure, unspecified: Secondary | ICD-10-CM | POA: Diagnosis not present

## 2016-08-31 DIAGNOSIS — Z801 Family history of malignant neoplasm of trachea, bronchus and lung: Secondary | ICD-10-CM

## 2016-08-31 DIAGNOSIS — I509 Heart failure, unspecified: Secondary | ICD-10-CM

## 2016-08-31 DIAGNOSIS — E669 Obesity, unspecified: Secondary | ICD-10-CM | POA: Diagnosis present

## 2016-08-31 DIAGNOSIS — Z881 Allergy status to other antibiotic agents status: Secondary | ICD-10-CM

## 2016-08-31 DIAGNOSIS — Z882 Allergy status to sulfonamides status: Secondary | ICD-10-CM

## 2016-08-31 DIAGNOSIS — E1122 Type 2 diabetes mellitus with diabetic chronic kidney disease: Secondary | ICD-10-CM | POA: Diagnosis not present

## 2016-08-31 DIAGNOSIS — L8990 Pressure ulcer of unspecified site, unspecified stage: Secondary | ICD-10-CM | POA: Insufficient documentation

## 2016-08-31 DIAGNOSIS — Z888 Allergy status to other drugs, medicaments and biological substances status: Secondary | ICD-10-CM | POA: Diagnosis not present

## 2016-08-31 DIAGNOSIS — F419 Anxiety disorder, unspecified: Secondary | ICD-10-CM | POA: Diagnosis not present

## 2016-08-31 DIAGNOSIS — I5033 Acute on chronic diastolic (congestive) heart failure: Secondary | ICD-10-CM | POA: Diagnosis present

## 2016-08-31 DIAGNOSIS — M545 Low back pain: Secondary | ICD-10-CM | POA: Diagnosis present

## 2016-08-31 DIAGNOSIS — Z87442 Personal history of urinary calculi: Secondary | ICD-10-CM

## 2016-08-31 DIAGNOSIS — E7800 Pure hypercholesterolemia, unspecified: Secondary | ICD-10-CM | POA: Diagnosis present

## 2016-08-31 DIAGNOSIS — Z91012 Allergy to eggs: Secondary | ICD-10-CM

## 2016-08-31 DIAGNOSIS — Z885 Allergy status to narcotic agent status: Secondary | ICD-10-CM

## 2016-08-31 DIAGNOSIS — J9691 Respiratory failure, unspecified with hypoxia: Secondary | ICD-10-CM | POA: Diagnosis present

## 2016-08-31 DIAGNOSIS — N183 Chronic kidney disease, stage 3 (moderate): Secondary | ICD-10-CM | POA: Diagnosis present

## 2016-08-31 DIAGNOSIS — G8929 Other chronic pain: Secondary | ICD-10-CM | POA: Diagnosis present

## 2016-08-31 LAB — CBC WITH DIFFERENTIAL/PLATELET
Basophils Absolute: 0.1 10*3/uL (ref 0–0.1)
Basophils Relative: 1 %
EOS ABS: 0.1 10*3/uL (ref 0–0.7)
Eosinophils Relative: 2 %
HCT: 38 % (ref 35.0–47.0)
HEMOGLOBIN: 12 g/dL (ref 12.0–16.0)
LYMPHS ABS: 1.2 10*3/uL (ref 1.0–3.6)
LYMPHS PCT: 16 %
MCH: 26.3 pg (ref 26.0–34.0)
MCHC: 31.7 g/dL — ABNORMAL LOW (ref 32.0–36.0)
MCV: 83 fL (ref 80.0–100.0)
Monocytes Absolute: 0.9 10*3/uL (ref 0.2–0.9)
Monocytes Relative: 12 %
NEUTROS PCT: 71 %
Neutro Abs: 5.4 10*3/uL (ref 1.4–6.5)
Platelets: 351 10*3/uL (ref 150–440)
RBC: 4.58 MIL/uL (ref 3.80–5.20)
RDW: 16 % — ABNORMAL HIGH (ref 11.5–14.5)
WBC: 7.6 10*3/uL (ref 3.6–11.0)

## 2016-08-31 LAB — COMPREHENSIVE METABOLIC PANEL
ALBUMIN: 3.4 g/dL — AB (ref 3.5–5.0)
ALT: 15 U/L (ref 14–54)
ANION GAP: 10 (ref 5–15)
AST: 17 U/L (ref 15–41)
Alkaline Phosphatase: 73 U/L (ref 38–126)
BUN: 51 mg/dL — ABNORMAL HIGH (ref 6–20)
CO2: 33 mmol/L — AB (ref 22–32)
Calcium: 9.9 mg/dL (ref 8.9–10.3)
Chloride: 93 mmol/L — ABNORMAL LOW (ref 101–111)
Creatinine, Ser: 2.13 mg/dL — ABNORMAL HIGH (ref 0.44–1.00)
GFR calc Af Amer: 25 mL/min — ABNORMAL LOW (ref >60)
GFR calc non Af Amer: 22 mL/min — ABNORMAL LOW (ref >60)
GLUCOSE: 286 mg/dL — AB (ref 65–99)
POTASSIUM: 4.6 mmol/L (ref 3.5–5.1)
SODIUM: 136 mmol/L (ref 135–145)
Total Bilirubin: 0.9 mg/dL (ref 0.3–1.2)
Total Protein: 7.6 g/dL (ref 6.5–8.1)

## 2016-08-31 LAB — URINALYSIS COMPLETE WITH MICROSCOPIC (ARMC ONLY)
BACTERIA UA: NONE SEEN
Bilirubin Urine: NEGATIVE
GLUCOSE, UA: 150 mg/dL — AB
Ketones, ur: NEGATIVE mg/dL
Nitrite: POSITIVE — AB
PH: 5 (ref 5.0–8.0)
Protein, ur: 100 mg/dL — AB
RBC / HPF: NONE SEEN RBC/hpf (ref 0–5)
SQUAMOUS EPITHELIAL / LPF: NONE SEEN
Specific Gravity, Urine: 1.014 (ref 1.005–1.030)

## 2016-08-31 LAB — BRAIN NATRIURETIC PEPTIDE: B Natriuretic Peptide: 163 pg/mL — ABNORMAL HIGH (ref 0.0–100.0)

## 2016-08-31 LAB — TROPONIN I: Troponin I: <0.03 ng/mL (ref <0.03)

## 2016-08-31 LAB — GLUCOSE, CAPILLARY: GLUCOSE-CAPILLARY: 272 mg/dL — AB (ref 65–99)

## 2016-08-31 NOTE — ED Triage Notes (Signed)
Pt from home via [facility]. EMS states pt was recently discharged from [facility] for CHF rehab. Pt complaining of back pain that she has had off and on for years. Reports burning with urination for several days. Patient reports increased urination. Patient was started on lasix 3 weeks ago. Strong urine smell noted upon arrival. Family reports history of UTI

## 2016-08-31 NOTE — ED Provider Notes (Addendum)
[facility] Emergency Department Provider Note   ____________________________________________   First MD Initiated Contact with Patient 08/31/16 2048     (approximate)  I have reviewed the triage vital signs and the nursing notes.   HISTORY  Chief Complaint Back Pain   HPI Carolyn Valdez is a 75 y.o. female patient reports she was sent here from [facility] for UTI symptoms. Patient reports she's urinating more often than usual. Patient has chronic low back pain which is unchanged. Patient reports she really feels okay and would not have come herself at [facility]. On arrival patient has low O2 sat. Patient says that she always is put on oxygen when she comes to the emergency room. Last time she was here per old records she had a CHF. She was admitted then. Patient denies any nausea vomiting fever chills coughing or shortness of breath.   Past Medical History:  Diagnosis Date  . Anxiety   . Breast cancer ([place])    16 yrs ago  . CHF (congestive heart failure) ([place])   . Diabetes mellitus without complication ([place])   . DJD (degenerative joint disease)   . Dysuria   . H/O total knee replacement    right  . HTN (hypertension)   . Hypercholesteremia   . Kidney stone   . Obesity   . Recurrent urinary tract infection   . SVT (supraventricular tachycardia) ([place])   . Urinary incontinence   . Vaginal atrophy   . Yeast vaginitis     Patient Active Problem List   Diagnosis Date Noted  . Lymphedema 08/16/2016  . CHF (congestive heart failure) ([place]) 08/09/2016  . Congestive heart failure ([place]) 11/25/2015  . Nocturia 11/05/2015  . Urinary frequency 11/05/2015  . Acute respiratory failure with hypoxia ([place]) 09/05/2015  . 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 Incontinence 04/20/2015  . Diabetes mellitus, type 2 ([place]) 04/17/2015  . ESBL (extended spectrum beta-lactamase) producing bacteria infection 04/17/2015  . BP (high blood pressure)  04/17/2015  . Frequent UTI 04/17/2015  . Absence of bladder continence 04/17/2015  . Iron deficiency anemia 03/08/2015  . Absolute anemia 09/25/2014  . Abdominal pain, lower 09/25/2014  . Urge incontinence 02/19/2013  . FOM (frequency of micturition) 02/19/2013  . Bladder infection, chronic 08/11/2012  . Difficult or painful urination 07/26/2012  . Lower urinary tract infection 12/31/2011  . Diabetes mellitus ([place]) 12/31/2011    Past Surgical History:  Procedure Laterality Date  . CARDIAC CATHETERIZATION    . CHOLECYSTECTOMY    . FOOT SURGERY    . JOINT REPLACEMENT    . MASTECTOMY Right 1997  . NASAL SEPTUM SURGERY    . PERIPHERAL VASCULAR CATHETERIZATION N/A 07/08/2015   Procedure: PICC Line Insertion;  Surgeon: Algernon Huxley, MD;  Location: [place] [facility];  Service: Cardiovascular;  Laterality: N/A;  . TONSILLECTOMY    . Vocal cords      Prior to Admission medications   Medication Sig Start Date End Date Taking? Authorizing Provider  amLODipine (NORVASC) 5 MG tablet Take 5 mg by mouth daily.   Yes Historical Provider, MD  atorvastatin (LIPITOR) 20 MG tablet Take 20 mg by mouth at bedtime.   Yes Historical Provider, MD  clonazePAM (KLONOPIN) 1 MG tablet Take 1 mg by mouth 3 (three) times daily.   Yes Historical Provider, MD  conjugated estrogens (PREMARIN) vaginal cream Place 1 Applicatorful vaginally 3 (three) times a week. Pt uses on Monday, Wednesday, and Friday.  Yes Historical Provider, MD  desmopressin (DDAVP) 0.2 MG tablet Take 1 tablet (0.2 mg total) by mouth at bedtime. 11/05/15  Yes Shannon A McGowan, PA-C  docusate sodium (COLACE) 100 MG capsule Take 200 mg by mouth 2 (two) times daily.   Yes Historical Provider, MD  fexofenadine (ALLEGRA) 180 MG tablet Take 180 mg by mouth daily.   Yes Historical Provider, MD  furosemide (LASIX) 40 MG tablet Take 1 tablet (40 mg total) by mouth 2 (two) times daily. 08/12/16  Yes Lytle Butte, MD  gabapentin (NEURONTIN) 300 MG  capsule Take 300-600 mg by mouth 3 (three) times daily. Pt takes two capsules in the morning, one capsule at lunch, and two capsules in the evening.   Yes Historical Provider, MD  hydrOXYzine (ATARAX/VISTARIL) 25 MG tablet Take 25 mg by mouth at bedtime.    Yes Historical Provider, MD  insulin glargine (LANTUS) 100 UNIT/ML injection Inject 50 Units into the skin at bedtime.   Yes Historical Provider, MD  iron polysaccharides (NIFEREX) 150 MG capsule Take 150 mg by mouth 3 (three) times daily.    Yes Historical Provider, MD  losartan (COZAAR) 100 MG tablet Take 100 mg by mouth daily.   Yes Historical Provider, MD  metFORMIN (GLUCOPHAGE) 1000 MG tablet Take 1,000 mg by mouth 2 (two) times daily with a meal.  10/25/15  Yes Historical Provider, MD  metoprolol succinate (TOPROL-XL) 100 MG 24 hr tablet Take 100 mg by mouth 2 (two) times daily.    Yes Historical Provider, MD  omeprazole (PRILOSEC) 40 MG capsule Take 40 mg by mouth 2 (two) times daily.   Yes Historical Provider, MD  pioglitazone (ACTOS) 30 MG tablet Take 30 mg by mouth daily.   Yes Historical Provider, MD  potassium chloride SA (K-DUR,KLOR-CON) 20 MEQ tablet Take 1 tablet (20 mEq total) by mouth daily. 08/13/16  Yes Lytle Butte, MD  VESICARE 10 MG tablet Take 1 tablet (10 mg total) by mouth daily. 11/05/15  Yes Shannon A McGowan, PA-C  vitamin E 1000 UNIT capsule Take 1,000 Units by mouth daily.   Yes Historical Provider, MD    Allergies Sulfa antibiotics; Biaxin [clarithromycin]; Eggs or egg-derived products; Influenza a (h1n1) monoval vac; Morphine; Pyridium [phenazopyridine hcl]; Ceftriaxone; Latex; Prednisone; and Tape  Family History  Problem Relation Age of Onset  . Lung cancer Father   . Hematuria Mother   . Lung cancer Mother   . Kidney disease Neg Hx   . Bladder Cancer Neg Hx   . Breast cancer Neg Hx     Social History Social History  Substance Use Topics  . Smoking status: Former Research scientist (life sciences)  . Smokeless tobacco: Former  Systems developer    Quit date: 10/29/1995  . Alcohol use No    Review of Systems Constitutional: No fever/chills Eyes: No visual changes. ENT: No sore throat. Cardiovascular: Denies chest pain. Respiratory: Denies shortness of breath. Gastrointestinal: No abdominal pain.  No nausea, no vomiting.  No diarrhea.  No constipation. Genitourinary: Negative for dysuria. Musculoskeletal: Negative for back pain. Skin: Negative for rash. Neurological: Negative for headaches, focal weakness  10-point ROS otherwise negative.  ____________________________________________   PHYSICAL EXAM:  VITAL SIGNS: ED Triage Vitals  Enc Vitals Group     BP --      Pulse Rate 08/31/16 2043 69     Resp 08/31/16 2043 18     Temp 08/31/16 2043 98.2 F (36.8 C)     Temp Source 08/31/16 2043 Oral  SpO2 08/31/16 2043 (!) 84 %     Weight 08/31/16 2044 300 lb (136.1 kg)     Height 08/31/16 2044 5\' 8"  (1.727 m)     Head Circumference --      [place] Flow --      Pain Score 08/31/16 2044 10     Pain Loc --      Pain Edu? --      Excl. in [place]? --     Constitutional: Alert and oriented. Well appearing and in no acute distress.Patient is obese. Eyes: Conjunctivae are normal. PERRL. EOMI. Head: Atraumatic. Nose: No congestion/rhinnorhea. Mouth/Throat: Mucous membranes are moist.  Oropharynx non-erythematous. Neck: No stridor.   Cardiovascular: Normal rate, regular rhythm. Grossly normal heart sounds.  Good peripheral circulation. Respiratory: Normal respiratory effort.  No retractions. Lungs CTAB. Gastrointestinal: Soft and nontender. No distention. No abdominal bruits. No CVA tenderness. Musculoskeletal: No lower extremity tenderness nor edema.  No joint effusions. Neurologic:  Normal speech and language. No gross focal neurologic deficits are appreciated.   ____________________________________________   LABS (all labs ordered are listed, but only abnormal results are displayed)  Labs Reviewed  URINALYSIS  COMPLETEWITH MICROSCOPIC ([facility] ONLY) - Abnormal; Notable for the following:       Result Value   Color, Urine YELLOW (*)    APPearance TURBID (*)    Glucose, UA 150 (*)    Hgb urine dipstick 1+ (*)    Protein, ur 100 (*)    Nitrite POSITIVE (*)    Leukocytes, UA 2+ (*)    All other components within normal limits  COMPREHENSIVE METABOLIC PANEL - Abnormal; Notable for the following:    Chloride 93 (*)    CO2 33 (*)    Glucose, Bld 286 (*)    BUN 51 (*)    Creatinine, Ser 2.13 (*)    Albumin 3.4 (*)    GFR calc non Af Amer 22 (*)    GFR calc Af Amer 25 (*)    All other components within normal limits  BRAIN NATRIURETIC PEPTIDE - Abnormal; Notable for the following:    B Natriuretic Peptide 163.0 (*)    All other components within normal limits  CBC WITH DIFFERENTIAL/PLATELET - Abnormal; Notable for the following:    MCHC 31.7 (*)    RDW 16.0 (*)    All other components within normal limits  GLUCOSE, CAPILLARY - Abnormal; Notable for the following:    Glucose-Capillary 272 (*)    All other components within normal limits  URINE CULTURE  TROPONIN I   ____________________________________________  EKG   ____________________________________________  RADIOLOGY  Study Result   CLINICAL DATA:  Hypoxia back pain  EXAM: PORTABLE CHEST 1 VIEW  COMPARISON:  08/11/2016  FINDINGS: There is stable cardiomegaly with aortic atherosclerosis and mild pulmonary vascular congestion and interstitial edema. No pneumonic consolidations noted. No significant pleural effusion. No pneumothorax identified. No suspicious osseous lesions.  IMPRESSION: Stable cardiomegaly with aortic atherosclerosis. Mild pulmonary vascular congestion and interstitial edema.   Electronically Signed   By: Ashley Royalty M.D.   On: 08/31/2016 21:29     ____________________________________________   PROCEDURES  Procedure(s) performed: Procedures  Critical Care performed:    ____________________________________________   INITIAL IMPRESSION / ASSESSMENT AND PLAN / ED COURSE  Pertinent labs & imaging results that were available during my care of the patient were reviewed by me and considered in my medical decision making (see chart for details).    Clinical Course  ____________________________________________   FINAL CLINICAL IMPRESSION(S) / ED DIAGNOSES  Final diagnoses:  Acute on chronic congestive heart failure, unspecified congestive heart failure type ([place])  Hypoxia  Urinary tract infection without hematuria, site unspecified  Acute renal failure, unspecified acute renal failure type ([place])      NEW MEDICATIONS STARTED DURING THIS VISIT:  New Prescriptions   No medications on file     Note:  This document was prepared using Dragon voice recognition software and may include unintentional dictation errors.    Nena Polio, MD 08/31/16 [place], MD 08/31/16 [phone]

## 2016-09-01 DIAGNOSIS — Z6841 Body Mass Index (BMI) 40.0 and over, adult: Secondary | ICD-10-CM | POA: Diagnosis not present

## 2016-09-01 DIAGNOSIS — Z885 Allergy status to narcotic agent status: Secondary | ICD-10-CM | POA: Diagnosis not present

## 2016-09-01 DIAGNOSIS — N179 Acute kidney failure, unspecified: Secondary | ICD-10-CM | POA: Diagnosis not present

## 2016-09-01 DIAGNOSIS — R0902 Hypoxemia: Secondary | ICD-10-CM | POA: Diagnosis present

## 2016-09-01 DIAGNOSIS — Z87891 Personal history of nicotine dependence: Secondary | ICD-10-CM | POA: Diagnosis not present

## 2016-09-01 DIAGNOSIS — Z794 Long term (current) use of insulin: Secondary | ICD-10-CM | POA: Diagnosis not present

## 2016-09-01 DIAGNOSIS — Z887 Allergy status to serum and vaccine status: Secondary | ICD-10-CM | POA: Diagnosis not present

## 2016-09-01 DIAGNOSIS — I5033 Acute on chronic diastolic (congestive) heart failure: Secondary | ICD-10-CM | POA: Diagnosis not present

## 2016-09-01 DIAGNOSIS — J9691 Respiratory failure, unspecified with hypoxia: Secondary | ICD-10-CM | POA: Diagnosis present

## 2016-09-01 DIAGNOSIS — Z853 Personal history of malignant neoplasm of breast: Secondary | ICD-10-CM | POA: Diagnosis not present

## 2016-09-01 DIAGNOSIS — Z888 Allergy status to other drugs, medicaments and biological substances status: Secondary | ICD-10-CM | POA: Diagnosis not present

## 2016-09-01 DIAGNOSIS — Z87440 Personal history of urinary (tract) infections: Secondary | ICD-10-CM | POA: Diagnosis not present

## 2016-09-01 DIAGNOSIS — Z882 Allergy status to sulfonamides status: Secondary | ICD-10-CM | POA: Diagnosis not present

## 2016-09-01 DIAGNOSIS — M545 Low back pain: Secondary | ICD-10-CM | POA: Diagnosis not present

## 2016-09-01 DIAGNOSIS — E1122 Type 2 diabetes mellitus with diabetic chronic kidney disease: Secondary | ICD-10-CM | POA: Diagnosis not present

## 2016-09-01 DIAGNOSIS — I130 Hypertensive heart and chronic kidney disease with heart failure and stage 1 through stage 4 chronic kidney disease, or unspecified chronic kidney disease: Secondary | ICD-10-CM | POA: Diagnosis not present

## 2016-09-01 DIAGNOSIS — N390 Urinary tract infection, site not specified: Secondary | ICD-10-CM | POA: Diagnosis not present

## 2016-09-01 DIAGNOSIS — J9601 Acute respiratory failure with hypoxia: Secondary | ICD-10-CM | POA: Diagnosis not present

## 2016-09-01 DIAGNOSIS — F419 Anxiety disorder, unspecified: Secondary | ICD-10-CM | POA: Diagnosis not present

## 2016-09-01 DIAGNOSIS — K219 Gastro-esophageal reflux disease without esophagitis: Secondary | ICD-10-CM | POA: Diagnosis not present

## 2016-09-01 LAB — GLUCOSE, CAPILLARY
GLUCOSE-CAPILLARY: 265 mg/dL — AB (ref 65–99)
GLUCOSE-CAPILLARY: 283 mg/dL — AB (ref 65–99)
GLUCOSE-CAPILLARY: 299 mg/dL — AB (ref 65–99)
GLUCOSE-CAPILLARY: 159 mg/dL — AB (ref 65–99)
GLUCOSE-CAPILLARY: 211 mg/dL — AB (ref 65–99)

## 2016-09-01 LAB — BASIC METABOLIC PANEL
ANION GAP: 9 (ref 5–15)
BUN: 50 mg/dL — ABNORMAL HIGH (ref 6–20)
CO2: 34 mmol/L — ABNORMAL HIGH (ref 22–32)
Calcium: 9.5 mg/dL (ref 8.9–10.3)
Chloride: 94 mmol/L — ABNORMAL LOW (ref 101–111)
Creatinine, Ser: 2.05 mg/dL — ABNORMAL HIGH (ref 0.44–1.00)
GFR, EST AFRICAN AMERICAN: 27 mL/min — AB (ref >60)
GFR, EST NON AFRICAN AMERICAN: 23 mL/min — AB (ref >60)
Glucose, Bld: 276 mg/dL — ABNORMAL HIGH (ref 65–99)
POTASSIUM: 4.2 mmol/L (ref 3.5–5.1)
SODIUM: 137 mmol/L (ref 135–145)

## 2016-09-01 LAB — CBC
HEMATOCRIT: 33.8 % — AB (ref 35.0–47.0)
HEMOGLOBIN: 10.8 g/dL — AB (ref 12.0–16.0)
MCH: 26.7 pg (ref 26.0–34.0)
MCHC: 31.8 g/dL — ABNORMAL LOW (ref 32.0–36.0)
MCV: 83.8 fL (ref 80.0–100.0)
Platelets: 305 10*3/uL (ref 150–440)
RBC: 4.04 MIL/uL (ref 3.80–5.20)
RDW: 16.3 % — ABNORMAL HIGH (ref 11.5–14.5)
WBC: 6.7 10*3/uL (ref 3.6–11.0)

## 2016-09-01 LAB — MRSA PCR SCREENING: MRSA BY PCR: POSITIVE — AB

## 2016-09-01 MED ORDER — CLONAZEPAM 1 MG PO TABS
1.0000 mg | ORAL_TABLET | Freq: Three times a day (TID) | ORAL | Status: DC
  Administered 2016-09-01 – 2016-09-05 (×12): 1 mg via ORAL
  Filled 2016-09-01 (×13): qty 1

## 2016-09-01 MED ORDER — DARIFENACIN HYDROBROMIDE ER 15 MG PO TB24
15.0000 mg | ORAL_TABLET | Freq: Every day | ORAL | Status: DC
  Administered 2016-09-01 – 2016-09-05 (×5): 15 mg via ORAL
  Filled 2016-09-01 (×5): qty 1

## 2016-09-01 MED ORDER — METOPROLOL SUCCINATE ER 100 MG PO TB24
100.0000 mg | ORAL_TABLET | Freq: Two times a day (BID) | ORAL | Status: DC
  Administered 2016-09-01 – 2016-09-03 (×5): 100 mg via ORAL
  Filled 2016-09-01 (×6): qty 1

## 2016-09-01 MED ORDER — LEVOFLOXACIN IN D5W 750 MG/150ML IV SOLN
750.0000 mg | INTRAVENOUS | Status: DC
  Filled 2016-09-01: qty 150

## 2016-09-01 MED ORDER — IPRATROPIUM-ALBUTEROL 0.5-2.5 (3) MG/3ML IN SOLN: 3.0000 mL | Freq: Four times a day (QID) | RESPIRATORY_TRACT | Status: DC | PRN

## 2016-09-01 MED ORDER — ACETAMINOPHEN 325 MG PO TABS
650.0000 mg | ORAL_TABLET | Freq: Four times a day (QID) | ORAL | Status: DC | PRN
  Administered 2016-09-01 – 2016-09-05 (×3): 650 mg via ORAL
  Filled 2016-09-01 (×4): qty 2

## 2016-09-01 MED ORDER — POLYSACCHARIDE IRON COMPLEX 150 MG PO CAPS
150.0000 mg | ORAL_CAPSULE | Freq: Three times a day (TID) | ORAL | Status: DC
  Administered 2016-09-01 – 2016-09-05 (×12): 150 mg via ORAL
  Filled 2016-09-01 (×12): qty 1

## 2016-09-01 MED ORDER — POTASSIUM CHLORIDE CRYS ER 20 MEQ PO TBCR
20.0000 meq | EXTENDED_RELEASE_TABLET | Freq: Every day | ORAL | Status: DC
  Administered 2016-09-01 – 2016-09-02 (×2): 20 meq via ORAL
  Filled 2016-09-01 (×4): qty 1

## 2016-09-01 MED ORDER — LEVOFLOXACIN IN D5W 750 MG/150ML IV SOLN
750.0000 mg | Freq: Once | INTRAVENOUS | Status: AC
  Administered 2016-09-01: 750 mg via INTRAVENOUS
  Filled 2016-09-01: qty 150

## 2016-09-01 MED ORDER — ENOXAPARIN SODIUM 30 MG/0.3ML ~~LOC~~ SOLN: 30.0000 mg | SUBCUTANEOUS | Status: DC

## 2016-09-01 MED ORDER — LOSARTAN POTASSIUM 50 MG PO TABS
100.0000 mg | ORAL_TABLET | Freq: Every day | ORAL | Status: DC
  Administered 2016-09-01: 100 mg via ORAL
  Filled 2016-09-01: qty 2

## 2016-09-01 MED ORDER — FUROSEMIDE 40 MG PO TABS
40.0000 mg | ORAL_TABLET | Freq: Two times a day (BID) | ORAL | Status: DC
  Administered 2016-09-01 – 2016-09-03 (×5): 40 mg via ORAL
  Filled 2016-09-01 (×5): qty 1

## 2016-09-01 MED ORDER — SODIUM CHLORIDE 0.9% FLUSH
3.0000 mL | Freq: Two times a day (BID) | INTRAVENOUS | Status: DC
  Administered 2016-09-01 – 2016-09-05 (×8): 3 mL via INTRAVENOUS

## 2016-09-01 MED ORDER — SENNOSIDES-DOCUSATE SODIUM 8.6-50 MG PO TABS: 1.0000 | ORAL_TABLET | Freq: Every evening | ORAL | Status: DC | PRN

## 2016-09-01 MED ORDER — GABAPENTIN 300 MG PO CAPS: 300.0000 mg | ORAL_CAPSULE | Freq: Three times a day (TID) | ORAL | Status: DC

## 2016-09-01 MED ORDER — INSULIN GLARGINE 100 UNIT/ML ~~LOC~~ SOLN
50.0000 [IU] | Freq: Every day | SUBCUTANEOUS | Status: DC
  Administered 2016-09-01 – 2016-09-04 (×4): 50 [IU] via SUBCUTANEOUS
  Filled 2016-09-01 (×5): qty 0.5

## 2016-09-01 MED ORDER — HYDROXYZINE HCL 25 MG PO TABS
25.0000 mg | ORAL_TABLET | Freq: Every day | ORAL | Status: DC
  Administered 2016-09-01 – 2016-09-04 (×4): 25 mg via ORAL
  Filled 2016-09-01 (×4): qty 1

## 2016-09-01 MED ORDER — ATORVASTATIN CALCIUM 20 MG PO TABS
20.0000 mg | ORAL_TABLET | Freq: Every day | ORAL | Status: DC
  Administered 2016-09-01 – 2016-09-04 (×4): 20 mg via ORAL
  Filled 2016-09-01 (×4): qty 1

## 2016-09-01 MED ORDER — GABAPENTIN 300 MG PO CAPS
600.0000 mg | ORAL_CAPSULE | Freq: Two times a day (BID) | ORAL | Status: DC
  Administered 2016-09-01 – 2016-09-05 (×9): 600 mg via ORAL
  Filled 2016-09-01 (×9): qty 2

## 2016-09-01 MED ORDER — DESMOPRESSIN ACETATE 0.2 MG PO TABS
0.2000 mg | ORAL_TABLET | Freq: Every day | ORAL | Status: DC
  Administered 2016-09-01 – 2016-09-04 (×4): 0.2 mg via ORAL
  Filled 2016-09-01 (×4): qty 1

## 2016-09-01 MED ORDER — LORATADINE 10 MG PO TABS
10.0000 mg | ORAL_TABLET | Freq: Every day | ORAL | Status: DC
  Administered 2016-09-01 – 2016-09-05 (×5): 10 mg via ORAL
  Filled 2016-09-01 (×5): qty 1

## 2016-09-01 MED ORDER — CHLORHEXIDINE GLUCONATE CLOTH 2 % EX PADS
6.0000 | MEDICATED_PAD | Freq: Every day | CUTANEOUS | Status: DC
  Administered 2016-09-02 – 2016-09-05 (×4): 6 via TOPICAL

## 2016-09-01 MED ORDER — SODIUM CHLORIDE 0.9 % IV SOLN: 250.0000 mL | INTRAVENOUS | Status: DC | PRN

## 2016-09-01 MED ORDER — PANTOPRAZOLE SODIUM 40 MG PO TBEC
40.0000 mg | DELAYED_RELEASE_TABLET | Freq: Every day | ORAL | Status: DC
  Administered 2016-09-01 – 2016-09-05 (×5): 40 mg via ORAL
  Filled 2016-09-01 (×5): qty 1

## 2016-09-01 MED ORDER — INSULIN ASPART 100 UNIT/ML ~~LOC~~ SOLN
0.0000 [IU] | Freq: Every day | SUBCUTANEOUS | Status: DC
  Administered 2016-09-01: 3 [IU] via SUBCUTANEOUS
  Administered 2016-09-02 – 2016-09-04 (×2): 2 [IU] via SUBCUTANEOUS
  Filled 2016-09-01 (×2): qty 2
  Filled 2016-09-01: qty 3

## 2016-09-01 MED ORDER — ONDANSETRON HCL 4 MG PO TABS: 4.0000 mg | ORAL_TABLET | Freq: Four times a day (QID) | ORAL | Status: DC | PRN

## 2016-09-01 MED ORDER — FUROSEMIDE 10 MG/ML IJ SOLN
20.0000 mg | Freq: Once | INTRAMUSCULAR | Status: AC
Start: 2016-09-01 — End: 2016-09-01
  Administered 2016-09-01: 20 mg via INTRAVENOUS
  Filled 2016-09-01: qty 2

## 2016-09-01 MED ORDER — BISACODYL 5 MG PO TBEC: 5.0000 mg | DELAYED_RELEASE_TABLET | Freq: Every day | ORAL | Status: DC | PRN

## 2016-09-01 MED ORDER — SODIUM CHLORIDE 0.9% FLUSH: 3.0000 mL | INTRAVENOUS | Status: DC | PRN

## 2016-09-01 MED ORDER — DOCUSATE SODIUM 100 MG PO CAPS
200.0000 mg | ORAL_CAPSULE | Freq: Two times a day (BID) | ORAL | Status: DC
  Administered 2016-09-01 – 2016-09-05 (×7): 200 mg via ORAL
  Filled 2016-09-01 (×8): qty 2

## 2016-09-01 MED ORDER — MAGNESIUM CITRATE PO SOLN
1.0000 | Freq: Once | ORAL | Status: DC | PRN
  Filled 2016-09-01: qty 296

## 2016-09-01 MED ORDER — GABAPENTIN 300 MG PO CAPS
300.0000 mg | ORAL_CAPSULE | Freq: Every day | ORAL | Status: DC
  Administered 2016-09-01: 300 mg via ORAL
  Filled 2016-09-01: qty 1

## 2016-09-01 MED ORDER — INSULIN ASPART 100 UNIT/ML ~~LOC~~ SOLN
0.0000 [IU] | Freq: Three times a day (TID) | SUBCUTANEOUS | Status: DC
  Administered 2016-09-01: 11 [IU] via SUBCUTANEOUS
  Administered 2016-09-01: 4 [IU] via SUBCUTANEOUS
  Administered 2016-09-01: 11 [IU] via SUBCUTANEOUS
  Administered 2016-09-02: 4 [IU] via SUBCUTANEOUS
  Administered 2016-09-02: 7 [IU] via SUBCUTANEOUS
  Administered 2016-09-02: 11 [IU] via SUBCUTANEOUS
  Administered 2016-09-03 (×2): 4 [IU] via SUBCUTANEOUS
  Administered 2016-09-03: 11 [IU] via SUBCUTANEOUS
  Administered 2016-09-04: 3 [IU] via SUBCUTANEOUS
  Administered 2016-09-04: 7 [IU] via SUBCUTANEOUS
  Administered 2016-09-04: 3 [IU] via SUBCUTANEOUS
  Administered 2016-09-05: 7 [IU] via SUBCUTANEOUS
  Filled 2016-09-01: qty 4
  Filled 2016-09-01: qty 7
  Filled 2016-09-01: qty 11
  Filled 2016-09-01: qty 4
  Filled 2016-09-01: qty 7
  Filled 2016-09-01: qty 4
  Filled 2016-09-01: qty 1
  Filled 2016-09-01: qty 3
  Filled 2016-09-01: qty 4
  Filled 2016-09-01: qty 7
  Filled 2016-09-01: qty 11
  Filled 2016-09-01: qty 4
  Filled 2016-09-01: qty 11

## 2016-09-01 MED ORDER — ESTROGENS, CONJUGATED 0.625 MG/GM VA CREA
1.0000 | TOPICAL_CREAM | VAGINAL | Status: DC
  Administered 2016-09-03: 1 via VAGINAL
  Filled 2016-09-01: qty 30

## 2016-09-01 MED ORDER — MUPIROCIN 2 % EX OINT
1.0000 "application " | TOPICAL_OINTMENT | Freq: Two times a day (BID) | CUTANEOUS | Status: DC
  Administered 2016-09-01 – 2016-09-05 (×8): 1 via NASAL
  Filled 2016-09-01: qty 22

## 2016-09-01 MED ORDER — SODIUM CHLORIDE 0.9% FLUSH
3.0000 mL | Freq: Two times a day (BID) | INTRAVENOUS | Status: DC
  Administered 2016-09-01 – 2016-09-05 (×7): 3 mL via INTRAVENOUS

## 2016-09-01 MED ORDER — ACETAMINOPHEN 650 MG RE SUPP: 650.0000 mg | Freq: Four times a day (QID) | RECTAL | Status: DC | PRN

## 2016-09-01 MED ORDER — AMLODIPINE BESYLATE 5 MG PO TABS
5.0000 mg | ORAL_TABLET | Freq: Every day | ORAL | Status: DC
  Administered 2016-09-01 – 2016-09-02 (×2): 5 mg via ORAL
  Administered 2016-09-03: 2.5 mg via ORAL
  Administered 2016-09-04: 5 mg via ORAL
  Filled 2016-09-01 (×4): qty 1

## 2016-09-01 MED ORDER — VITAMIN E 45 MG (100 UNIT) PO CAPS
1000.0000 [IU] | ORAL_CAPSULE | Freq: Every day | ORAL | Status: DC
  Administered 2016-09-02 – 2016-09-05 (×4): 1000 [IU] via ORAL
  Filled 2016-09-01 (×5): qty 2

## 2016-09-01 MED ORDER — ENOXAPARIN SODIUM 40 MG/0.4ML ~~LOC~~ SOLN
40.0000 mg | Freq: Two times a day (BID) | SUBCUTANEOUS | Status: DC
  Administered 2016-09-01 – 2016-09-05 (×9): 40 mg via SUBCUTANEOUS
  Filled 2016-09-01 (×9): qty 0.4

## 2016-09-01 MED ORDER — ONDANSETRON HCL 4 MG/2ML IJ SOLN: 4.0000 mg | Freq: Four times a day (QID) | INTRAMUSCULAR | Status: DC | PRN

## 2016-09-01 MED ORDER — ORAL CARE MOUTH RINSE
15.0000 mL | Freq: Two times a day (BID) | OROMUCOSAL | Status: DC
  Administered 2016-09-01 – 2016-09-04 (×8): 15 mL via OROMUCOSAL

## 2016-09-01 NOTE — Consult Note (Signed)
[place]  CARDIOLOGY CONSULT NOTE  Patient ID: Carolyn Valdez MRN: [id number] DOB/AGE: 07/02/1941 75 y.o.  Admit date: 08/31/2016 Referring Physician Dr. Sissy Hoff Primary Physician   Primary Cardiologist   Reason for Consultation CHF  HPI: Pt is a 75 yo female with history of diastolic heart failure with ef 50-55%, diabetes, history of svt who was admitted after presenting to the er with urinary symptoms including urgency and dysuria. She had several days of dysuria and fever. She denied sob. She was noted ot have a uti. CXR revealed stable cardiomegaly with persistent mild pulmonary vascular congestion and interstitial edema. Troponin was normal. Echo done several weeks ago revealed normal ef with mild lvh. Mild nr and tr. EKG revealed nsr with no ischemia. She received a dose of iv lasix with improvement. She is also on abx for her uti.   Review of Systems  Constitutional: Positive for chills and fever.  HENT: Negative.   Eyes: Negative.   Respiratory: Negative.   Cardiovascular: Negative.   Gastrointestinal: Negative.   Genitourinary: Positive for dysuria, frequency and urgency.  Musculoskeletal: Negative.   Skin: Negative.   Neurological: Positive for weakness.  Endo/Heme/Allergies: Negative.   Psychiatric/Behavioral: Negative.     Past Medical History:  Diagnosis Date  . Anxiety   . Breast cancer ([place])    16 yrs ago  . CHF (congestive heart failure) ([place])   . Diabetes mellitus without complication ([place])   . DJD (degenerative joint disease)   . Dysuria   . H/O total knee replacement    right  . HTN (hypertension)   . Hypercholesteremia   . Kidney stone   . Obesity   . Recurrent urinary tract infection   . SVT (supraventricular tachycardia) ([place])   . Urinary incontinence   . Vaginal atrophy   . Yeast vaginitis     Family History  Problem Relation Age of Onset  . Lung cancer Father   . Hematuria Mother   . Lung cancer  Mother   . Kidney disease Neg Hx   . Bladder Cancer Neg Hx   . Breast cancer Neg Hx     Social History   Social History  . Marital status: Widowed    Spouse name: N/A  . Number of children: N/A  . Years of education: N/A   Occupational History  . Not on file.   Social History Main Topics  . Smoking status: Former Research scientist (life sciences)  . Smokeless tobacco: Former Systems developer    Quit date: 10/29/1995  . Alcohol use No  . Drug use: No  . Sexual activity: Not on file   Other Topics Concern  . Not on file   Social History Narrative  . No narrative on file    Past Surgical History:  Procedure Laterality Date  . CARDIAC CATHETERIZATION    . CHOLECYSTECTOMY    . FOOT SURGERY    . JOINT REPLACEMENT    . MASTECTOMY Right 1997  . NASAL SEPTUM SURGERY    . PERIPHERAL VASCULAR CATHETERIZATION N/A 07/08/2015   Procedure: PICC Line Insertion;  Surgeon: Algernon Huxley, MD;  Location: [place] [facility];  Service: Cardiovascular;  Laterality: N/A;  . TONSILLECTOMY    . Vocal cords       Prescriptions Prior to Admission  Medication Sig Dispense Refill Last Dose  . amLODipine (NORVASC) 5 MG tablet Take 5 mg by mouth daily.   08/31/2016 at Unknown time  . atorvastatin (LIPITOR) 20 MG  tablet Take 20 mg by mouth at bedtime.   08/30/2016 at Unknown time  . clonazePAM (KLONOPIN) 1 MG tablet Take 1 mg by mouth 3 (three) times daily.   08/31/2016 at Unknown time  . conjugated estrogens (PREMARIN) vaginal cream Place 1 Applicatorful vaginally 3 (three) times a week. Pt uses on Monday, Wednesday, and Friday.   Past Week at Unknown time  . desmopressin (DDAVP) 0.2 MG tablet Take 1 tablet (0.2 mg total) by mouth at bedtime. 90 tablet 3 08/30/2016 at Unknown time  . docusate sodium (COLACE) 100 MG capsule Take 200 mg by mouth 2 (two) times daily.   08/31/2016 at Unknown time  . fexofenadine (ALLEGRA) 180 MG tablet Take 180 mg by mouth daily.   08/31/2016 at Unknown time  . furosemide (LASIX) 40 MG tablet Take 1 tablet  (40 mg total) by mouth 2 (two) times daily. 60 tablet 0 08/31/2016 at Unknown time  . gabapentin (NEURONTIN) 300 MG capsule Take 300-600 mg by mouth 3 (three) times daily. Pt takes two capsules in the morning, one capsule at lunch, and two capsules in the evening.   08/31/2016 at Unknown time  . hydrOXYzine (ATARAX/VISTARIL) 25 MG tablet Take 25 mg by mouth at bedtime.    08/31/2016 at Unknown time  . insulin glargine (LANTUS) 100 UNIT/ML injection Inject 50 Units into the skin at bedtime.   08/30/2016 at Unknown time  . iron polysaccharides (NIFEREX) 150 MG capsule Take 150 mg by mouth 3 (three) times daily.    08/31/2016 at Unknown time  . losartan (COZAAR) 100 MG tablet Take 100 mg by mouth daily.   08/31/2016 at Unknown time  . metFORMIN (GLUCOPHAGE) 1000 MG tablet Take 1,000 mg by mouth 2 (two) times daily with a meal.    08/31/2016 at Unknown time  . metoprolol succinate (TOPROL-XL) 100 MG 24 hr tablet Take 100 mg by mouth 2 (two) times daily.    08/31/2016 at Unknown time  . omeprazole (PRILOSEC) 40 MG capsule Take 40 mg by mouth 2 (two) times daily.   08/31/2016 at Unknown time  . pioglitazone (ACTOS) 30 MG tablet Take 30 mg by mouth daily.   08/31/2016 at Unknown time  . potassium chloride SA (K-DUR,KLOR-CON) 20 MEQ tablet Take 1 tablet (20 mEq total) by mouth daily. 30 tablet 0 08/31/2016 at Unknown time  . VESICARE 10 MG tablet Take 1 tablet (10 mg total) by mouth daily. 90 tablet 3 08/31/2016 at Unknown time  . vitamin E 1000 UNIT capsule Take 1,000 Units by mouth daily.   08/31/2016 at Unknown time    Physical Exam: Blood pressure (!) 135/38, pulse 62, temperature 98.5 F (36.9 C), temperature source Oral, resp. rate 19, height 5\' 8"  (1.727 m), weight (!) 136.1 kg (300 lb 0.7 oz), SpO2 98 %.   Wt Readings from Last 1 Encounters:  09/01/16 (!) 136.1 kg (300 lb 0.7 oz)     General appearance: alert and cooperative Head: Normocephalic, without obvious abnormality, atraumatic Resp:  clear to auscultation bilaterally Cardio: regular rate and rhythm GI: soft, non-tender; bowel sounds normal; no masses,  no organomegaly Extremities: extremities normal, atraumatic, no cyanosis or edema Pulses: 2+ and symmetric Neurologic: Grossly normal  Labs:   Lab Results  Component Value Date   WBC 6.7 09/01/2016   HGB 10.8 (L) 09/01/2016   HCT 33.8 (L) 09/01/2016   MCV 83.8 09/01/2016   PLT 305 09/01/2016    Recent Labs Lab 08/31/16 2145 09/01/16 0434  NA 136  137  K 4.6 4.2  CL 93* 94*  CO2 33* 34*  BUN 51* 50*  CREATININE 2.13* 2.05*  CALCIUM 9.9 9.5  PROT 7.6  --   BILITOT 0.9  --   ALKPHOS 73  --   ALT 15  --   AST 17  --   GLUCOSE 286* 276*   Lab Results  Component Value Date   CKTOTAL 65 08/24/2013   CKMB 1.4 08/24/2013   TROPONINI <0.03 08/31/2016      Radiology: mild pulmonary vascular congestion EKG: nsr with no ischemia  ASSESSMENT AND PLAN:  Pt with history of diastollic dysfunciton who was admitted after presenting to the er with urinary symptoms and noted ot have uti. She also was noted ot have mild volume overload on cxr. She denies sygnificant sob. EF is normal by echo less than one month ago. Will conintue with po lasix after improvement with iv dose. Continue to treat with iv abx for uti. No further cardiac workup indicated during this admission. Will follow as outpatient if desired.  Signed: Teodoro Spray MD, [facility] 09/01/2016, 2:11 PM

## 2016-09-01 NOTE — H&P (Addendum)
[place]at [place] @ [facility] Admission History and Physical Carolyn Valdez, D.O.  ---------------------------------------------------------------------------------------------------------------------   PATIENT NAME: Carolyn Valdez MR#: [id number] DATE OF BIRTH: [birth year] DATE OF ADMISSION: 08/31/2016 PRIMARY CARE PHYSICIAN: Leonel Ramsay, MD  REQUESTING/REFERRING PHYSICIAN: ED Dr. Cinda Quest  CHIEF COMPLAINT: Chief Complaint  Patient presents with  . Back Pain    HISTORY OF PRESENT ILLNESS: Carolyn Valdez is a 75 y.o. female with a known history of Breast cancer, diastolic CHF, diabetes, degenerative disc disease, osteoarthritis, history of ESBL UTI, history of SVT, urinary incontinence  presents to the emergency department complaining of Dysuria. Per emergency department records patient [facility]. She states that she has been complaining for several days of increased urinary frequency, dysuria and fever.  Otherwise there has been no change in status. Patient has been taking medication as prescribed and there has been no recent change in medication or diet.  There has been no recent illness, travel or sick contacts.    Patient denies chills, weakness, dizziness, chest pain, shortness of breath, N/V/C/D, abdominal pain, dysuria/frequency, changes in mental status.   EMS/ED COURSE:   Patient was found to have a urinary tract infection, acute kidney injury and exacerbation of congestive heart failure, diastolic therefore the hospitalists were contacted requesting admission.  PAST MEDICAL HISTORY: Past Medical History:  Diagnosis Date  . Anxiety   . Breast cancer ([place])    16 yrs ago  . CHF (congestive heart failure) ([place])   . Diabetes mellitus without complication ([place])   . DJD (degenerative joint disease)   . Dysuria   . H/O total knee replacement    right  . HTN (hypertension)   . Hypercholesteremia   . Kidney stone   . Obesity   .  Recurrent urinary tract infection   . SVT (supraventricular tachycardia) ([place])   . Urinary incontinence   . Vaginal atrophy   . Yeast vaginitis       PAST SURGICAL HISTORY: Past Surgical History:  Procedure Laterality Date  . CARDIAC CATHETERIZATION    . CHOLECYSTECTOMY    . FOOT SURGERY    . JOINT REPLACEMENT    . MASTECTOMY Right 1997  . NASAL SEPTUM SURGERY    . PERIPHERAL VASCULAR CATHETERIZATION N/A 07/08/2015   Procedure: PICC Line Insertion;  Surgeon: Algernon Huxley, MD;  Location: [facility];  Service: Cardiovascular;  Laterality: N/A;  . TONSILLECTOMY    . Vocal cords        SOCIAL HISTORY: Social History  Substance Use Topics  . Smoking status: Former Research scientist (life sciences)  . Smokeless tobacco: Former Systems developer    Quit date: 10/29/1995  . Alcohol use No      FAMILY HISTORY: Family History  Problem Relation Age of Onset  . Lung cancer Father   . Hematuria Mother   . Lung cancer Mother   . Kidney disease Neg Hx   . Bladder Cancer Neg Hx   . Breast cancer Neg Hx      MEDICATIONS AT HOME: Prior to Admission medications   Medication Sig Start Date End Date Taking? Authorizing Provider  amLODipine (NORVASC) 5 MG tablet Take 5 mg by mouth daily.   Yes Historical Provider, MD  atorvastatin (LIPITOR) 20 MG tablet Take 20 mg by mouth at bedtime.   Yes Historical Provider, MD  clonazePAM (KLONOPIN) 1 MG tablet Take 1 mg by mouth 3 (three) times daily.   Yes Historical Provider, MD  conjugated estrogens (PREMARIN)  vaginal cream Place 1 Applicatorful vaginally 3 (three) times a week. Pt uses on Monday, Wednesday, and Friday.   Yes Historical Provider, MD  desmopressin (DDAVP) 0.2 MG tablet Take 1 tablet (0.2 mg total) by mouth at bedtime. 11/05/15  Yes Shannon A McGowan, PA-C  docusate sodium (COLACE) 100 MG capsule Take 200 mg by mouth 2 (two) times daily.   Yes Historical Provider, MD  fexofenadine (ALLEGRA) 180 MG tablet Take 180 mg by mouth daily.   Yes Historical Provider, MD   furosemide (LASIX) 40 MG tablet Take 1 tablet (40 mg total) by mouth 2 (two) times daily. 08/12/16  Yes Lytle Butte, MD  gabapentin (NEURONTIN) 300 MG capsule Take 300-600 mg by mouth 3 (three) times daily. Pt takes two capsules in the morning, one capsule at lunch, and two capsules in the evening.   Yes Historical Provider, MD  hydrOXYzine (ATARAX/VISTARIL) 25 MG tablet Take 25 mg by mouth at bedtime.    Yes Historical Provider, MD  insulin glargine (LANTUS) 100 UNIT/ML injection Inject 50 Units into the skin at bedtime.   Yes Historical Provider, MD  iron polysaccharides (NIFEREX) 150 MG capsule Take 150 mg by mouth 3 (three) times daily.    Yes Historical Provider, MD  losartan (COZAAR) 100 MG tablet Take 100 mg by mouth daily.   Yes Historical Provider, MD  metFORMIN (GLUCOPHAGE) 1000 MG tablet Take 1,000 mg by mouth 2 (two) times daily with a meal.  10/25/15  Yes Historical Provider, MD  metoprolol succinate (TOPROL-XL) 100 MG 24 hr tablet Take 100 mg by mouth 2 (two) times daily.    Yes Historical Provider, MD  omeprazole (PRILOSEC) 40 MG capsule Take 40 mg by mouth 2 (two) times daily.   Yes Historical Provider, MD  pioglitazone (ACTOS) 30 MG tablet Take 30 mg by mouth daily.   Yes Historical Provider, MD  potassium chloride SA (K-DUR,KLOR-CON) 20 MEQ tablet Take 1 tablet (20 mEq total) by mouth daily. 08/13/16  Yes Lytle Butte, MD  VESICARE 10 MG tablet Take 1 tablet (10 mg total) by mouth daily. 11/05/15  Yes Shannon A McGowan, PA-C  vitamin E 1000 UNIT capsule Take 1,000 Units by mouth daily.   Yes Historical Provider, MD      DRUG ALLERGIES: Allergies  Allergen Reactions  . Sulfa Antibiotics Hives  . Biaxin [Clarithromycin] Hives  . Eggs Or Egg-Derived Products Other (See Comments)    Pt states that her allergy test said she was allergic to eggs.    . Influenza A (H1n1) Monoval Vac Other (See Comments)    Pt states that she was told by her MD not to get the influenza vaccine.     . Morphine Other (See Comments)    Reaction:  Dizziness and confusion   . Pyridium [Phenazopyridine Hcl] Other (See Comments)    Reaction:  Unknown   . Ceftriaxone Anxiety  . Latex Rash  . Prednisone Rash  . Tape Rash     REVIEW OF SYSTEMS: CONSTITUTIONAL: No fatigue, weakness, fever, chills, weight gain/loss, headache EYES: No blurry or double vision. ENT: No tinnitus, postnasal drip, redness or soreness of the oropharynx. RESPIRATORY: No dyspnea, cough, wheeze, hemoptysis. CARDIOVASCULAR: No chest pain, orthopnea, palpitations, syncope. GASTROINTESTINAL: No nausea, vomiting, constipation, diarrhea, abdominal pain. No hematemesis, melena or hematochezia. GENITOURINARY: Positive dysuria, frequency, negative hematuria. ENDOCRINE: No polyuria or nocturia. No heat or cold intolerance. HEMATOLOGY: No anemia, bruising, bleeding. INTEGUMENTARY: No rashes, ulcers, lesions. MUSCULOSKELETAL: No pain, arthritis, swelling, gout.  NEUROLOGIC: No numbness, tingling, weakness or ataxia. No seizure-type activity. Positive for chronic low back pain. PSYCHIATRIC: No anxiety, depression, insomnia.  PHYSICAL EXAMINATION: VITAL SIGNS: Blood pressure (!) 143/51, pulse 63, temperature 98.2 F (36.8 C), temperature source Oral, resp. rate 19, height 5\' 8"  (1.727 m), weight 136.1 kg (300 lb), SpO2 98 %.  GENERAL: 75 y.o.-year-old obese pale white female patient, well-developed, well-nourished lying in the bed in no acute distress.  Pleasant and cooperative.   HEENT: Head atraumatic, normocephalic. Pupils equal, round, reactive to light and accommodation. No scleral icterus. Extraocular muscles intact. Oropharynx is clear. Mucus membranes moist. NECK: Supple, full range of motion. No JVD, no bruit heard. No cervical lymphadenopathy. CHEST: Normal breath sounds bilaterally. No wheezing, rales, rhonchi or crackles. No use of accessory muscles of respiration.  No reproducible chest wall tenderness.   CARDIOVASCULAR: S1, S2 normal. No murmurs, rubs, or gallops appreciated. Cap refill <2 seconds. ABDOMEN: Soft, nontender, nondistended. No rebound, guarding, rigidity. Normoactive bowel sounds present in all four quadrants. No organomegaly or mass. EXTREMITIES: Full range of motion. Moderate bilateral lower extremity edema NEUROLOGIC: Cranial nerves II through XII are grossly intact with no focal sensorimotor deficit. Muscle strength 5/5 in all extremities. Sensation intact. Gait not checked. PSYCHIATRIC: The patient is alert and oriented x 3. Normal affect, mood, thought content. SKIN: Warm, dry, and intact without obvious rash, lesion, or ulcer.  LABORATORY PANEL:  CBC  Recent Labs Lab 08/31/16 2145  WBC 7.6  HGB 12.0  HCT 38.0  PLT 351   ----------------------------------------------------------------------------------------------------------------- Chemistries  Recent Labs Lab 08/31/16 2145  NA 136  K 4.6  CL 93*  CO2 33*  GLUCOSE 286*  BUN 51*  CREATININE 2.13*  CALCIUM 9.9  AST 17  ALT 15  ALKPHOS 73  BILITOT 0.9   ------------------------------------------------------------------------------------------------------------------ Cardiac Enzymes  Recent Labs Lab 08/31/16 2145  TROPONINI <0.03   ------------------------------------------------------------------------------------------------------------------  RADIOLOGY: Dg Chest Portable 1 View  Result Date: 08/31/2016 CLINICAL DATA:  Hypoxia back pain EXAM: PORTABLE CHEST 1 VIEW COMPARISON:  08/11/2016 FINDINGS: There is stable cardiomegaly with aortic atherosclerosis and mild pulmonary vascular congestion and interstitial edema. No pneumonic consolidations noted. No significant pleural effusion. No pneumothorax identified. No suspicious osseous lesions. IMPRESSION: Stable cardiomegaly with aortic atherosclerosis. Mild pulmonary vascular congestion and interstitial edema. Electronically Signed   By: Ashley Royalty M.D.   On: 08/31/2016 21:29    IMPRESSION AND PLAN:  This is a 75 y.o. female with a history of Breast cancer, diastolic CHF, diabetes, degenerative disc disease, osteoarthritis, history of ESBL UTI, history of SVT, urinary incontinence  now being admitted with: 1. Acute exacerbation of diastolic heart failure with hypoxia. Will admit to inpatient for diuresis, Is/Os, O2 and nebulizer therapy as needed. Cardiology consultation has been requested. 2. Acute kidney injury on chronic kidney disease. We'll monitor BMP and request nephrology consultation for assistance with fluid management. Hold ARB for now.  3. Urinary tract infection-patient is started on Rocephin and will follow up urine cultures.  4. History of hypertension-continue Norvasc, metoprolol, Hold Cozaar 6. History of hyperlipidemia-continue Lipitor 7. History of GERD-continue Prilosec 5. History of diabetes-Accu-Cheks before meals and at bedtime with regular insulin siding scale coverage and continue Lantus at bedtime.  Diet/Nutrition: Heart healthy, carb controlled FluidHep-Lock pending nephrology consultT Px: Lovenox, SCDs and early ambulation Code Status: Full  All the records are reviewed and case discussed with ED provider. Management plans discussed with the patient and/or family who express understanding and agree with plan  of care.   TOTAL TIME TAKING CARE OF THIS PATIENT: 60 minutes.   Dresden Ament D.O. on 09/01/2016 at 12:52 AM Between 7am to 6pm - Pager - [phone] After 6pm go to [email or web address] - [employer] [place] [facility] Office [phone] CC: Primary care physician; [name], DAVID Mamie Nick, MD     Note: This dictation was prepared with Dragon dictation along with smaller phrase technology. Any transcriptional errors that result from this process are unintentional.

## 2016-09-01 NOTE — Care Management (Signed)
Patient discharged from [facility] 10/26 to [facility].  She was discharged from the facility 11.14 because she was not participating with rehab therapy.  She discharge home with Home health SN PT OT Aide SW through [facility] and in home aide caregivers through [place] [place].  It is verbally reported through [facility].  Patient 'would not allow her sister to use patient's finances to pay for the medical equipment that was to be delivered to the home.  Patient says she does not know about any equipment but was very firm when she said "[facility] did not discharge me- I left."  Discussed she was competent and is allowed to make a bad decision but she would have to take active part in the plan at home to meet her care needs.  have called [facility] to determine what equipment has been ordered and provided referral for home health SN PT OT Aide SW because [facility] did not have record of referral and not sure "which office." handled the referral.  Patient gives CM permission to speak with her sister. Notified [facility].   UPdated attending.  Awaiting Call back from sister.

## 2016-09-01 NOTE — Progress Notes (Signed)
Admitted this morning because of back pain and found to have UTI, hypoxia. Patient admitted for UTI, acute on chronic diastolic heart failure.  Labs reviewed, patient vitals reviewed, medications are reviewed.  Patient alert, but unable to give complete history because she is very sleepy. Catching up to sleep from last night.  Cardiovascular S1-S2 regular  Lungs are bibasilar crepitations  Abdomen soft ,nontender,BS+ nondistended. Extremity.;2+ edema present.   #1 acute on chronic diastolic heart failure: Mild flareup: Continue IV Lasix, oxygen, monitor clinical course. Echocardiogram done on October 24 showed EF more than 55%. Continue beta blockers.  #2. UTI: Continue  levaquin. follow urine cultures, history of ESBL,  #3 diabetes mellitus type 2; continue Lantus, SSI with coverage  Anxiety;; on klonipin.  History of breast cancer  Acute on chronic renal failure: Monitor kidney function closely/nephrology is consulted.  poor. Intake: Dietary consult requested.   time Spent 25 minutes

## 2016-09-01 NOTE — Progress Notes (Signed)
Lovenox dose changed to 40 mg bid for BMI >40 and CrCl >30.

## 2016-09-02 DIAGNOSIS — I130 Hypertensive heart and chronic kidney disease with heart failure and stage 1 through stage 4 chronic kidney disease, or unspecified chronic kidney disease: Secondary | ICD-10-CM | POA: Diagnosis not present

## 2016-09-02 DIAGNOSIS — L8990 Pressure ulcer of unspecified site, unspecified stage: Secondary | ICD-10-CM | POA: Insufficient documentation

## 2016-09-02 DIAGNOSIS — R0902 Hypoxemia: Secondary | ICD-10-CM | POA: Diagnosis not present

## 2016-09-02 LAB — GLUCOSE, CAPILLARY
GLUCOSE-CAPILLARY: 236 mg/dL — AB (ref 65–99)
Glucose-Capillary: 183 mg/dL — ABNORMAL HIGH (ref 65–99)
Glucose-Capillary: 251 mg/dL — ABNORMAL HIGH (ref 65–99)
Glucose-Capillary: 243 mg/dL — ABNORMAL HIGH (ref 65–99)

## 2016-09-02 LAB — BASIC METABOLIC PANEL
Anion gap: 10 (ref 5–15)
BUN: 48 mg/dL — ABNORMAL HIGH (ref 6–20)
CALCIUM: 9.6 mg/dL (ref 8.9–10.3)
CO2: 34 mmol/L — AB (ref 22–32)
CREATININE: 2.16 mg/dL — AB (ref 0.44–1.00)
Chloride: 93 mmol/L — ABNORMAL LOW (ref 101–111)
GFR, EST AFRICAN AMERICAN: 25 mL/min — AB (ref >60)
GFR, EST NON AFRICAN AMERICAN: 21 mL/min — AB (ref >60)
GLUCOSE: 168 mg/dL — AB (ref 65–99)
Potassium: 4 mmol/L (ref 3.5–5.1)
Sodium: 137 mmol/L (ref 135–145)

## 2016-09-02 MED ORDER — VANCOMYCIN HCL 10 G IV SOLR
1500.0000 mg | Freq: Once | INTRAVENOUS | Status: AC
  Administered 2016-09-02: 1500 mg via INTRAVENOUS
  Filled 2016-09-02: qty 1500

## 2016-09-02 MED ORDER — SODIUM CHLORIDE 0.9 % IV SOLN
1500.0000 mg | INTRAVENOUS | Status: DC
  Filled 2016-09-02: qty 1500

## 2016-09-02 MED ORDER — NYSTATIN 100000 UNIT/GM EX POWD
Freq: Three times a day (TID) | CUTANEOUS | Status: DC
  Administered 2016-09-02 – 2016-09-05 (×8): via TOPICAL
  Filled 2016-09-02: qty 15

## 2016-09-02 NOTE — Progress Notes (Signed)
Order put in at 1806 for I &O cath. Task passed to night shift, pt hesitant to be I&O cath, pt bladder scanned again, results show 375, No need to I&O cath at this time

## 2016-09-02 NOTE — Clinical Social Work Note (Signed)
MSW received referral for SNF.  Case discussed with case manager and plan is to discharge home with home health.  MSW to sign off please re-consult if social work needs arise.  Yeiden Frenkel R. Chayden Garrelts, MSW Mon-Fri 8a-4:30p [phone]   

## 2016-09-02 NOTE — Progress Notes (Signed)
[place] [facility] [place] NAME: Carolyn Valdez    MR#:  [id number]  DATE OF BIRTH:  02-12-1941  SUBJECTIVE: Patient complains of back pain. Admitted for UTI, acute on chronic diastolic heart failure. She wants to see physical therapy today.   CHIEF COMPLAINT:   Chief Complaint  Patient presents with  . Back Pain    REVIEW OF SYSTEMS:   ROS CONSTITUTIONAL: No fever, fatigue or weakness.  EYES: No blurred or double vision.  EARS, NOSE, AND THROAT: No tinnitus or ear pain.  RESPIRATORY: No cough, shortness of breath, wheezing or hemoptysis.  CARDIOVASCULAR: No chest pain, orthopnea, edema.  GASTROINTESTINAL: No nausea, vomiting, diarrhea or abdominal pain.  GENITOURINARY: No dysuria, hematuria.  ENDOCRINE: No polyuria, nocturia,  HEMATOLOGY: No anemia, easy bruising or bleeding SKIN: No rash or lesion. MUSCULOSKELETAL: c/o of back pain. NEUROLOGIC: No tingling, numbness, weakness.  PSYCHIATRY: No anxiety or depression.   DRUG ALLERGIES:   Allergies  Allergen Reactions  . Sulfa Antibiotics Hives  . Biaxin [Clarithromycin] Hives  . Eggs Or Egg-Derived Products Other (See Comments)    Pt states that her allergy test said she was allergic to eggs.    . Influenza A (H1n1) Monoval Vac Other (See Comments)    Pt states that she was told by her MD not to get the influenza vaccine.    . Morphine Other (See Comments)    Reaction:  Dizziness and confusion   . Pyridium [Phenazopyridine Hcl] Other (See Comments)    Reaction:  Unknown   . Ceftriaxone Anxiety  . Latex Rash  . Prednisone Rash  . Tape Rash    VITALS:  Blood pressure (!) 121/39, pulse 65, temperature 97.7 F (36.5 C), temperature source Oral, resp. rate 18, height 5\' 8"  (1.727 m), weight 129 kg (284 lb 8 oz), SpO2 92 %.  PHYSICAL EXAMINATION:  GENERAL:  75 y.o.-year-old patient lying in the bed with no acute distress.  EYES: Pupils equal, round, reactive to light and  accommodation. No scleral icterus. Extraocular muscles intact.  HEENT: Head atraumatic, normocephalic. Oropharynx and nasopharynx clear.  NECK:  Supple, no jugular venous distention. No thyroid enlargement, no tenderness.  LUNGS: Normal breath sounds bilaterally, no wheezing, rales,rhonchi or crepitation. No use of accessory muscles of respiration.  CARDIOVASCULAR: S1, S2 normal. No murmurs, rubs, or gallops.  ABDOMEN: Soft, nontender, nondistended. Bowel sounds present. No organomegaly or mass.  EXTREMITIES: No pedal edema, cyanosis, or clubbing.  NEUROLOGIC: Cranial nerves II through XII are intact. Muscle strength 5/5 in all extremities. Sensation intact. Gait not checked.  PSYCHIATRIC: The patient is alert and oriented x 3.  SKIN: No obvious rash, lesion, or ulcer.    LABORATORY PANEL:   CBC  Recent Labs Lab 09/01/16 0434  WBC 6.7  HGB 10.8*  HCT 33.8*  PLT 305   ------------------------------------------------------------------------------------------------------------------  Chemistries   Recent Labs Lab 08/31/16 2145  09/02/16 0350  NA 136  < > 137  K 4.6  < > 4.0  CL 93*  < > 93*  CO2 33*  < > 34*  GLUCOSE 286*  < > 168*  BUN 51*  < > 48*  CREATININE 2.13*  < > 2.16*  CALCIUM 9.9  < > 9.6  AST 17  --   --   ALT 15  --   --   ALKPHOS 73  --   --   BILITOT 0.9  --   --   < > =  values in this interval not displayed. ------------------------------------------------------------------------------------------------------------------  Cardiac Enzymes  Recent Labs Lab 08/31/16 2145  TROPONINI <0.03   ------------------------------------------------------------------------------------------------------------------  RADIOLOGY:  Dg Chest Portable 1 View  Result Date: 08/31/2016 CLINICAL DATA:  Hypoxia back pain EXAM: PORTABLE CHEST 1 VIEW COMPARISON:  08/11/2016 FINDINGS: There is stable cardiomegaly with aortic atherosclerosis and mild pulmonary vascular  congestion and interstitial edema. No pneumonic consolidations noted. No significant pleural effusion. No pneumothorax identified. No suspicious osseous lesions. IMPRESSION: Stable cardiomegaly with aortic atherosclerosis. Mild pulmonary vascular congestion and interstitial edema. Electronically Signed   By: Ashley Royalty M.D.   On: 08/31/2016 21:29    EKG:   Orders placed or performed during the hospital encounter of 02/23/16  . ED EKG  . ED EKG  . EKG    ASSESSMENT AND PLAN:   1.acute  On  Chronic Diastolic Heart Failure: Stable at This Time. Seen by Cardiology. Echo Showed 55% Ejection Fraction. Follow-Up with Cardiology and CHF Clinic As an Outpatient. #2 UTI, follow urine cultures, continue Levaquin. #3 diabetes mellitus type 2: On Lantus, SSI with coverage #4 .acute on chronic renal failure, chronic kidney disease stage III: Monitor kidney function, slight worsening of kidney function today. #Deconditioning physical therapy consult. Spoke with case manager  yesterday, patient refused physical therapy at home    All the records are reviewed and case discussed with Care Management/Social Workerr. Management plans discussed with the patient, family and they are in agreement.  CODE STATUS: full  TOTAL TIME TAKING CARE OF THIS PATIENT: 35 minutes.   POSSIBLE D/C IN 1-2 DAYS, DEPENDING ON CLINICAL CONDITION.   Epifanio Lesches M.D on 09/02/2016 at 12:45 PM  Between 7am to 6pm - Pager - [phone]  After 6pm go to [email or web address] - password [email or web address] [facility]  [place] [facility]  Office  [phone]  CC: Primary care physician; [name], DAVID Mamie Nick, MD   Note: This dictation was prepared with Dragon dictation along with smaller phrase technology. Any transcriptional errors that result from this process are unintentional.

## 2016-09-02 NOTE — Progress Notes (Signed)
Patient complains of continuous need to void.  Patient placed on bedpan with dribbling.  Bladder scan completed and revealed 550 ml of urine bladder.  Patient continues to complain about redness and discomfort to red areas to groin.  Verbal orders obtained.

## 2016-09-02 NOTE — Care Management (Signed)
Discussed home equipment needs with patient and [facility].  Patient says her bed goes up and down and does not feel she needs hospital bed.  Patient is agreeable to hoyer lift, BSC and wheelchair.  Carolyn Valdez discussed copays and monthly financial obligations and patient in agreement.  She does not know how she will get home.  She does not know that her sister is not going to be involved with her care for a while so she will not be an option for transportation.  Will plan on ems.  Spoke with Sam from [facility] and will make every attempt to coordinate staff to be at the house when patient arrives and if arrives home early enough- [facility] will make initial visit.

## 2016-09-02 NOTE — Care Management (Signed)
Spoke with patient's sister and she verbalizes her frustration with the patient's choices.  Says patient is not kind when she tries to help her.  Will be speaking with [place] today about equipment needs and delivery.  Patient would not give the "code" to get into her house during last attempt at equipment coordination.  Insists patient does need hospital bed, hoyer life BSC and W/C.

## 2016-09-02 NOTE — Plan of Care (Signed)
Problem: Urinary Elimination: Goal: Signs and symptoms of infection will decrease Outcome: Not Progressing Continues to void incontinently without complaints of pain/burning.

## 2016-09-02 NOTE — Progress Notes (Signed)
Initial Heart Failure Clinic appointment scheduled for September 17, 2016 at 11:00am. Of note, she did not show for a previously scheduled appointment on 08/25/16. Thank you.

## 2016-09-02 NOTE — Progress Notes (Signed)
Initial Nutrition Assessment  DOCUMENTATION CODES:   Obesity unspecified  INTERVENTION:  1. Magic cup TID with meals, each supplement provides 290 kcal and 9 grams of protein  NUTRITION DIAGNOSIS:   Inadequate oral intake related to poor appetite as evidenced by per patient/family report.  GOAL:   Patient will meet greater than or equal to 90% of their needs  MONITOR:   PO intake, I & O's, Labs, Weight trends  REASON FOR ASSESSMENT:   Consult Poor PO  ASSESSMENT:   Carolyn Valdez is a 75 y.o. female with a known history of Breast cancer, diastolic CHF, diabetes, degenerative disc disease, osteoarthritis, history of ESBL UTI, history of SVT, urinary incontinence  presents to the emergency department complaining of Dysuria  Spoke with Carolyn Valdez at bedside. She admits to ok appetite at home. Admits to eating 1-2 meals/day at home. Reports weight loss related to fluid but none otherwise. This morning she had homefries, grits and sausage for breakfast - ate ~50% she states. Patient was sleepy during my visit, fell asleep at one point.  Nutrition-Focused physical exam completed. Findings are no fat depletion, no muscle depletion, and no edema.   Labs and medications reviewed: CBG 183 Colace, Lasix  Diet Order:  Diet heart healthy/carb modified Room service appropriate? Yes; Fluid consistency: Thin  Skin:  Wound (see comment) (Stg II to sacrum)  Last BM:  11/15  Height:   Ht Readings from Last 1 Encounters:  08/31/16 5\' 8"  (1.727 m)    Weight:   Wt Readings from Last 1 Encounters:  09/02/16 284 lb 8 oz (129 kg)    Ideal Body Weight:  63.63 kg  BMI:  Body mass index is 43.26 kg/m.  Estimated Nutritional Needs:   Kcal:  1600-2000 calories  Protein:  129-140 gm  Fluid:  >/= 1.6L  EDUCATION NEEDS:   No education needs identified at this time  Carolyn Valdez. Carolyn Hazelett, MS, RD LDN Inpatient Clinical Dietitian Pager [phone]

## 2016-09-02 NOTE — Progress Notes (Signed)
Pharmacy Antibiotic Note  Carolyn Valdez is a 75 y.o. female admitted on 08/31/2016 with UTI.  Pharmacy has been consulted for vancomycin dosing.  Plan: Vancomycin 1500 mg IV x1 then 1500 mg IV q36h.  Will need to follow renal function closely and adjust dose as needed. Will need to order trough when able. SCr ordered for AM.   Ke 0.024, half life 29 h, Vd 63 L  Height: 5\' 8"  (172.7 cm) Weight: 284 lb 8 oz (129 kg) IBW/kg (Calculated) : 63.9  Temp (24hrs), Avg:98.5 F (36.9 C), Min:97.7 F (36.5 C), Max:99.3 F (37.4 C)   Recent Labs Lab 08/31/16 2145 09/01/16 0434 09/02/16 0350  WBC 7.6 6.7  --   CREATININE 2.13* 2.05* 2.16*    Estimated Creatinine Clearance: 32.9 mL/min (by C-G formula based on SCr of 2.16 mg/dL (H)).    Allergies  Allergen Reactions  . Sulfa Antibiotics Hives  . Biaxin [Clarithromycin] Hives  . Eggs Or Egg-Derived Products Other (See Comments)    Pt states that her allergy test said she was allergic to eggs.    . Influenza A (H1n1) Monoval Vac Other (See Comments)    Pt states that she was told by her MD not to get the influenza vaccine.    . Morphine Other (See Comments)    Reaction:  Dizziness and confusion   . Pyridium [Phenazopyridine Hcl] Other (See Comments)    Reaction:  Unknown   . Ceftriaxone Anxiety  . Latex Rash  . Prednisone Rash  . Tape Rash     Thank you for allowing pharmacy to be a part of this patient's care.  Rocky Morel 09/02/2016 3:54 PM

## 2016-09-03 DIAGNOSIS — I130 Hypertensive heart and chronic kidney disease with heart failure and stage 1 through stage 4 chronic kidney disease, or unspecified chronic kidney disease: Secondary | ICD-10-CM | POA: Diagnosis not present

## 2016-09-03 DIAGNOSIS — R0902 Hypoxemia: Secondary | ICD-10-CM | POA: Diagnosis not present

## 2016-09-03 LAB — URINE CULTURE: Culture: >=100000 — AB

## 2016-09-03 LAB — GLUCOSE, CAPILLARY
Glucose-Capillary: 171 mg/dL — ABNORMAL HIGH (ref 65–99)
Glucose-Capillary: 279 mg/dL — ABNORMAL HIGH (ref 65–99)
Glucose-Capillary: 166 mg/dL — ABNORMAL HIGH (ref 65–99)
Glucose-Capillary: 149 mg/dL — ABNORMAL HIGH (ref 65–99)

## 2016-09-03 LAB — CREATININE, SERUM
CREATININE: 2.18 mg/dL — AB (ref 0.44–1.00)
GFR calc Af Amer: 25 mL/min — ABNORMAL LOW (ref >60)
GFR, EST NON AFRICAN AMERICAN: 21 mL/min — AB (ref >60)

## 2016-09-03 MED ORDER — DOXYCYCLINE HYCLATE 100 MG PO TABS
100.0000 mg | ORAL_TABLET | Freq: Two times a day (BID) | ORAL | Status: DC
  Administered 2016-09-03 – 2016-09-05 (×5): 100 mg via ORAL
  Filled 2016-09-03 (×5): qty 1

## 2016-09-03 MED ORDER — INSULIN ASPART 100 UNIT/ML ~~LOC~~ SOLN
5.0000 [IU] | Freq: Three times a day (TID) | SUBCUTANEOUS | Status: DC
  Administered 2016-09-03 – 2016-09-05 (×5): 5 [IU] via SUBCUTANEOUS
  Filled 2016-09-03 (×5): qty 5

## 2016-09-03 MED ORDER — FUROSEMIDE 20 MG PO TABS
20.0000 mg | ORAL_TABLET | Freq: Every day | ORAL | Status: DC
  Administered 2016-09-05: 20 mg via ORAL
  Filled 2016-09-03 (×2): qty 1

## 2016-09-03 NOTE — Care Management (Addendum)
Spoke with attending regarding plan of care.  Clarified that patient did not refuse physical therapy at home.  She refused to participate with therapy at the skilled nursing facility.  She left the facility.  Discussed the anticipated discharge date as [facility] has to have the caregiver in place when patient arrives home. Unless there is significant intervention required for the UTI, patient should discharge home tomorrow.  [facility] will have staff person in place at 4pm.  Patient will travel home by ems. [facility] updated.  [facility] will contact [facility] to have the [facility] lift, BSC, w/c and wheel chair pad delivered. Have requested Script for hospital bed in the event the patient decides she wants one after discharge. Patient is the only person that knows the code to get in her house.  it is a touch pad and there should be no problem gaining access to the house at discharge. Do not see documentation present that patient has been assessed for home 02.  Spoke with primary nurse

## 2016-09-03 NOTE — Progress Notes (Signed)
[place] at [place] NAME: Carolyn Valdez    MR#:  [id number]  DATE OF BIRTH:  1941-03-05  C/o back pain.  CHIEF COMPLAINT:   Chief Complaint  Patient presents with  . Back Pain    REVIEW OF SYSTEMS:   ROS CONSTITUTIONAL: No fever, fatigue or weakness.  EYES: No blurred or double vision.  EARS, NOSE, AND THROAT: No tinnitus or ear pain.  RESPIRATORY: No cough, shortness of breath, wheezing or hemoptysis.  CARDIOVASCULAR: No chest pain, orthopnea, edema.  GASTROINTESTINAL: No nausea, vomiting, diarrhea or abdominal pain.  GENITOURINARY: No dysuria, hematuria.  ENDOCRINE: No polyuria, nocturia,  HEMATOLOGY: No anemia, easy bruising or bleeding SKIN: No rash or lesion. MUSCULOSKELETAL: c/o of back pain. NEUROLOGIC: No tingling, numbness, weakness.  PSYCHIATRY: No anxiety or depression.   DRUG ALLERGIES:   Allergies  Allergen Reactions  . Sulfa Antibiotics Hives  . Biaxin [Clarithromycin] Hives  . Eggs Or Egg-Derived Products Other (See Comments)    Pt states that her allergy test said she was allergic to eggs.    . Influenza A (H1n1) Monoval Vac Other (See Comments)    Pt states that she was told by her MD not to get the influenza vaccine.    . Morphine Other (See Comments)    Reaction:  Dizziness and confusion   . Pyridium [Phenazopyridine Hcl] Other (See Comments)    Reaction:  Unknown   . Ceftriaxone Anxiety  . Latex Rash  . Prednisone Rash  . Tape Rash    VITALS:  Blood pressure (!) 127/42, pulse 64, temperature 98.7 F (37.1 C), temperature source Oral, resp. rate 18, height 5\' 8"  (1.727 m), weight 129.4 kg (285 lb 4.8 oz), SpO2 92 %.  PHYSICAL EXAMINATION:  GENERAL:  75 y.o.-year-old patient lying in the bed with no acute distress.  EYES: Pupils equal, round, reactive to light and accommodation. No scleral icterus. Extraocular muscles intact.  HEENT: Head atraumatic, normocephalic. Oropharynx and nasopharynx  clear.  NECK:  Supple, no jugular venous distention. No thyroid enlargement, no tenderness.  LUNGS: Normal breath sounds bilaterally, no wheezing, rales,rhonchi or crepitation. No use of accessory muscles of respiration.  CARDIOVASCULAR: S1, S2 normal. No murmurs, rubs, or gallops.  ABDOMEN: Soft, nontender, nondistended. Bowel sounds present. No organomegaly or mass.  EXTREMITIES: No pedal edema, cyanosis, or clubbing.  NEUROLOGIC: Cranial nerves II through XII are intact. Muscle strength 5/5 in all extremities. Sensation intact. Gait not checked.  PSYCHIATRIC: The patient is alert and oriented x 3.  SKIN: No obvious rash, lesion, or ulcer.    LABORATORY PANEL:   CBC  Recent Labs Lab 09/01/16 0434  WBC 6.7  HGB 10.8*  HCT 33.8*  PLT 305   ------------------------------------------------------------------------------------------------------------------  Chemistries   Recent Labs Lab 08/31/16 2145  09/02/16 0350 09/03/16 0523  NA 136  < > 137  --   K 4.6  < > 4.0  --   CL 93*  < > 93*  --   CO2 33*  < > 34*  --   GLUCOSE 286*  < > 168*  --   BUN 51*  < > 48*  --   CREATININE 2.13*  < > 2.16* 2.18*  CALCIUM 9.9  < > 9.6  --   AST 17  --   --   --   ALT 15  --   --   --   ALKPHOS 73  --   --   --  BILITOT 0.9  --   --   --   < > = values in this interval not displayed. ------------------------------------------------------------------------------------------------------------------  Cardiac Enzymes  Recent Labs Lab 08/31/16 2145  TROPONINI <0.03   ------------------------------------------------------------------------------------------------------------------  RADIOLOGY:  No results found.  EKG:   Orders placed or performed during the hospital encounter of 02/23/16  . ED EKG  . ED EKG  . EKG    ASSESSMENT AND PLAN:   1.acute  On  Chronic Diastolic Heart Failure: Stable at This Time. Seen by Cardiology. Echo Showed 55% Ejection Fraction. Follow-Up  with Cardiology and CHF Clinic As an Outpatient. #2MRSA uti;on vanco,d/c levaquin. #3 diabetes mellitus type 2: On Lantus, SSI with coverage #4 .acute on chronic renal failure, chronic kidney disease stage III: Monitor kidney function, slight worsening of kidney function today. #Deconditioning; physical therapy consult. Spoke with case manager  yesterday, patient refused physical therapy at home;pt requesting PT eval during this admission.    All the records are reviewed and case discussed with Care Management/Social Workerr. Management plans discussed with the patient, family and they are in agreement.  CODE STATUS: full  TOTAL TIME TAKING CARE OF THIS PATIENT: 35 minutes.   POSSIBLE D/C IN 1-2 DAYS, DEPENDING ON CLINICAL CONDITION.   Epifanio Lesches M.D on 09/03/2016 at 2:07 PM  Between 7am to 6pm - Pager - [phone]  After 6pm go to [email or web address] - password [email or web address] [facility]  [place] [facility]  Office  [phone]  CC: Primary care physician; [name], DAVID Mamie Nick, MD   Note: This dictation was prepared with Dragon dictation along with smaller phrase technology. Any transcriptional errors that result from this process are unintentional.

## 2016-09-03 NOTE — Progress Notes (Signed)
Inpatient Diabetes Program Recommendations  AACE/ADA: New Consensus Statement on Inpatient Glycemic Control (2015)  Target Ranges:  Prepandial:   less than 140 mg/dL      Peak postprandial:   less than 180 mg/dL (1-2 hours)      Critically ill patients:  140 - 180 mg/dL   Lab Results  Component Value Date   GLUCAP 171 (H) 09/03/2016   HGBA1C 6.8 (H) 08/12/2016    Review of Glycemic Control  Results for SIDDA, HUMM (MRN [id number]) as of 09/03/2016 12:00  Ref. Range 09/02/2016 07:52 09/02/2016 11:45 09/02/2016 16:40 09/02/2016 21:19 09/03/2016 07:53  Glucose-Capillary Latest Ref Range: 65 - 99 mg/dL 183 (H) 236 (H) 251 (H) 243 (H) 171 (H)    Diabetes history: Type 2 Outpatient Diabetes medications: Lantus 50 units qhs  Current orders for Inpatient glycemic control: Novolog 0-9 units tid, Novlog 0-5 units qhs, Lantus 50 units qhs  Inpatient Diabetes Program Recommendations: Post prandial blood sugars elevated despite Novolog correction.  Consider adding Novolog 5 units tid with meals (hold if patient eats less than 50%)  Gentry Fitz, RN, [place], [place], CDE Diabetes Coordinator Inpatient Diabetes Program  [phone] (Team Pager) [phone] ([place]) 09/03/2016 12:04 PM

## 2016-09-03 NOTE — Progress Notes (Signed)
ID E note Pt well known to me as otpt PCP.  She has long hx recurrnet UTIs. Admitted this time with hypoxia as well as dysuria and fever. On admission wbc was nml, AF but UA with TNTC WBC and received one dose levo, then vanco and now doxy. UCX with > 100 k MRSA  Pt is allergic to sulfa, she has CKD so Macrobid less likely to be effective and MRSA was R to clinda.   Would rec 10 day course of doxycycline with start date 11/16.

## 2016-09-03 NOTE — Plan of Care (Signed)
Problem: Education: Goal: Knowledge of [place] [facility] Education information/materials will improve Outcome: Progressing Plan of care , discussed with patient  Problem: Safety: Goal: Ability to remain free from injury will improve Outcome: Progressing Patient remained free from injury this shift; Falls precautions maintained  Problem: Pain Managment: Goal: General experience of comfort will improve Outcome: Progressing Patient not requesting any pain meds thus far this shift  Problem: Physical Regulation: Goal: Will remain free from infection Outcome: Progressing No new signs or symptoms of infection noted  Problem: Skin Integrity: Goal: Risk for impaired skin integrity will decrease Outcome: Progressing No new skin breakdown noted; UTA to assess backside as patient having difficult with turning as states that lots of movement cause her pain and discomfor

## 2016-09-04 DIAGNOSIS — I130 Hypertensive heart and chronic kidney disease with heart failure and stage 1 through stage 4 chronic kidney disease, or unspecified chronic kidney disease: Secondary | ICD-10-CM | POA: Diagnosis not present

## 2016-09-04 DIAGNOSIS — R0902 Hypoxemia: Secondary | ICD-10-CM | POA: Diagnosis not present

## 2016-09-04 LAB — BASIC METABOLIC PANEL
Anion gap: 7 (ref 5–15)
BUN: 42 mg/dL — AB (ref 6–20)
CHLORIDE: 95 mmol/L — AB (ref 101–111)
CO2: 35 mmol/L — ABNORMAL HIGH (ref 22–32)
Calcium: 9.7 mg/dL (ref 8.9–10.3)
Creatinine, Ser: 1.95 mg/dL — ABNORMAL HIGH (ref 0.44–1.00)
GFR calc Af Amer: 28 mL/min — ABNORMAL LOW (ref >60)
GFR calc non Af Amer: 24 mL/min — ABNORMAL LOW (ref >60)
Glucose, Bld: 137 mg/dL — ABNORMAL HIGH (ref 65–99)
POTASSIUM: 3.9 mmol/L (ref 3.5–5.1)
SODIUM: 137 mmol/L (ref 135–145)

## 2016-09-04 LAB — GLUCOSE, CAPILLARY
GLUCOSE-CAPILLARY: 139 mg/dL — AB (ref 65–99)
GLUCOSE-CAPILLARY: 214 mg/dL — AB (ref 65–99)
GLUCOSE-CAPILLARY: 141 mg/dL — AB (ref 65–99)
Glucose-Capillary: 173 mg/dL — ABNORMAL HIGH (ref 65–99)
Glucose-Capillary: 215 mg/dL — ABNORMAL HIGH (ref 65–99)

## 2016-09-04 MED ORDER — AMLODIPINE BESYLATE 5 MG PO TABS
2.5000 mg | ORAL_TABLET | Freq: Every day | ORAL | Status: DC
  Administered 2016-09-05: 2.5 mg via ORAL
  Filled 2016-09-04: qty 1

## 2016-09-04 MED ORDER — METOPROLOL SUCCINATE ER 25 MG PO TB24
25.0000 mg | ORAL_TABLET | Freq: Two times a day (BID) | ORAL | Status: DC
  Administered 2016-09-04 – 2016-09-05 (×2): 25 mg via ORAL
  Filled 2016-09-04 (×2): qty 1

## 2016-09-04 NOTE — Progress Notes (Signed)
[place] at [place] NAME: Carolyn Valdez    MR#:  [id number]  DATE OF BIRTH:  [birth year]  Patient denies any complaints, having  chronic back pain. Having blood pressure issues, BP is soft, holding the metoprolol, Lasix.   CHIEF COMPLAINT:   Chief Complaint  Patient presents with  . Back Pain    REVIEW OF SYSTEMS:   ROS CONSTITUTIONAL: No fever, fatigue or weakness.  EYES: No blurred or double vision.  EARS, NOSE, AND THROAT: No tinnitus or ear pain.  RESPIRATORY: No cough, shortness of breath, wheezing or hemoptysis.  CARDIOVASCULAR: No chest pain, orthopnea, edema.  GASTROINTESTINAL: No nausea, vomiting, diarrhea or abdominal pain.  GENITOURINARY: No dysuria, hematuria.  ENDOCRINE: No polyuria, nocturia,  HEMATOLOGY: No anemia, easy bruising or bleeding SKIN: No rash or lesion. MUSCULOSKELETAL: c/o of back pain. NEUROLOGIC: No tingling, numbness, weakness.  PSYCHIATRY: No anxiety or depression.   DRUG ALLERGIES:   Allergies  Allergen Reactions  . Sulfa Antibiotics Hives  . Biaxin [Clarithromycin] Hives  . Eggs Or Egg-Derived Products Other (See Comments)    Pt states that her allergy test said she was allergic to eggs.    . Influenza A (H1n1) Monoval Vac Other (See Comments)    Pt states that she was told by her MD not to get the influenza vaccine.    . Morphine Other (See Comments)    Reaction:  Dizziness and confusion   . Pyridium [Phenazopyridine Hcl] Other (See Comments)    Reaction:  Unknown   . Ceftriaxone Anxiety  . Latex Rash  . Prednisone Rash  . Tape Rash    VITALS:  Blood pressure (!) 133/40, pulse (!) 58, temperature 99.4 F (37.4 C), temperature source Oral, resp. rate 18, height 5\' 8"  (1.727 m), weight 127 kg (279 lb 14.4 oz), SpO2 95 %.  PHYSICAL EXAMINATION:  GENERAL:  75 y.o.-year-old patient lying in the bed with no acute distress.  EYES: Pupils equal, round, reactive to light and accommodation.  No scleral icterus. Extraocular muscles intact.  HEENT: Head atraumatic, normocephalic. Oropharynx and nasopharynx clear.  NECK:  Supple, no jugular venous distention. No thyroid enlargement, no tenderness.  LUNGS: Normal breath sounds bilaterally, no wheezing, rales,rhonchi or crepitation. No use of accessory muscles of respiration.  CARDIOVASCULAR: S1, S2 normal. No murmurs, rubs, or gallops.  ABDOMEN: Soft, nontender, nondistended. Bowel sounds present. No organomegaly or mass.  EXTREMITIES: No pedal edema, cyanosis, or clubbing.  NEUROLOGIC: Cranial nerves II through XII are intact. Muscle strength 5/5 in all extremities. Sensation intact. Gait not checked.  PSYCHIATRIC: The patient is alert and oriented x 3.  SKIN: No obvious rash, lesion, or ulcer.    LABORATORY PANEL:   CBC  Recent Labs Lab 09/01/16 0434  WBC 6.7  HGB 10.8*  HCT 33.8*  PLT 305   ------------------------------------------------------------------------------------------------------------------  Chemistries   Recent Labs Lab 08/31/16 2145  09/04/16 0543  NA 136  < > 137  K 4.6  < > 3.9  CL 93*  < > 95*  CO2 33*  < > 35*  GLUCOSE 286*  < > 137*  BUN 51*  < > 42*  CREATININE 2.13*  < > 1.95*  CALCIUM 9.9  < > 9.7  AST 17  --   --   ALT 15  --   --   ALKPHOS 73  --   --   BILITOT 0.9  --   --   < > =  values in this interval not displayed. ------------------------------------------------------------------------------------------------------------------  Cardiac Enzymes  Recent Labs Lab 08/31/16 2145  TROPONINI <0.03   ------------------------------------------------------------------------------------------------------------------  RADIOLOGY:  No results found.  EKG:   Orders placed or performed during the hospital encounter of 02/23/16  . ED EKG  . ED EKG  . EKG    ASSESSMENT AND PLAN:   1.acute  On  Chronic Diastolic Heart Failure: Stable at This Time. Seen by Cardiology. Echo  Showed 55% Ejection Fraction. Follow-Up with Cardiology and CHF Clinic As an Outpatient.Check oxygen saturations to assess for home oxygen need. #2MRSA uti;Spoke with ID, patient his doxycycline tablets for 10 days for MRSA UTI. #3 diabetes mellitus type 2: On Lantus, SSI with coverage #4 .acute on chronic renal failure, chronic kidney disease stage III: Stable kidney function. #5 essential hypertension:BP  Is soft.adjust BP meds and watch overnight and told her we will discharge he rhome  #Deconditioning; physical therapy consult. Spoke with case manager  yesterday, patient refused physical therapy at home;She refused to participate with therapy at the skilled nursing facility    All the records are reviewed and case discussed with Care Management/Social Workerr. Management plans discussed with the patient, family and they are in agreement.  CODE STATUS: full  TOTAL TIME TAKING CARE OF THIS PATIENT: 35 minutes.   POSSIBLE D/C IN 1-2 DAYS, DEPENDING ON CLINICAL CONDITION.   Epifanio Lesches M.D on 09/04/2016 at 11:17 AM  Between 7am to 6pm - Pager - [phone]  After 6pm go to [email or web address] - password [email or web address] [facility]  [place] [facility]  Office  [phone]  CC: Primary care physician; [name], DAVID Mamie Nick, MD   Note: This dictation was prepared with Dragon dictation along with smaller phrase technology. Any transcriptional errors that result from this process are unintentional.

## 2016-09-05 DIAGNOSIS — I130 Hypertensive heart and chronic kidney disease with heart failure and stage 1 through stage 4 chronic kidney disease, or unspecified chronic kidney disease: Secondary | ICD-10-CM | POA: Diagnosis not present

## 2016-09-05 DIAGNOSIS — R0902 Hypoxemia: Secondary | ICD-10-CM | POA: Diagnosis not present

## 2016-09-05 LAB — GLUCOSE, CAPILLARY
Glucose-Capillary: 119 mg/dL — ABNORMAL HIGH (ref 65–99)
Glucose-Capillary: 228 mg/dL — ABNORMAL HIGH (ref 65–99)

## 2016-09-05 MED ORDER — METOPROLOL SUCCINATE ER 25 MG PO TB24: 25.0000 mg | ORAL_TABLET | Freq: Two times a day (BID) | ORAL | 0 refills | Status: DC

## 2016-09-05 MED ORDER — NYSTATIN 100000 UNIT/GM EX POWD: Freq: Three times a day (TID) | CUTANEOUS | 0 refills | Status: DC

## 2016-09-05 MED ORDER — FUROSEMIDE 20 MG PO TABS: 20.0000 mg | ORAL_TABLET | Freq: Every day | ORAL | 0 refills | Status: DC

## 2016-09-05 MED ORDER — INSULIN ASPART 100 UNIT/ML ~~LOC~~ SOLN: 0.0000 [IU] | Freq: Three times a day (TID) | SUBCUTANEOUS | 11 refills | Status: DC

## 2016-09-05 MED ORDER — DOXYCYCLINE HYCLATE 100 MG PO TABS: 100.0000 mg | ORAL_TABLET | Freq: Two times a day (BID) | ORAL | 0 refills | Status: DC

## 2016-09-05 MED ORDER — INSULIN ASPART 100 UNIT/ML ~~LOC~~ SOLN: 0.0000 [IU] | Freq: Every day | SUBCUTANEOUS | 11 refills | Status: DC

## 2016-09-05 MED ORDER — AMLODIPINE BESYLATE 2.5 MG PO TABS: 2.5000 mg | ORAL_TABLET | Freq: Every day | ORAL | 0 refills | Status: DC

## 2016-09-05 NOTE — Care Management Note (Signed)
Case Management Note  Patient Details  Name: Carolyn Valdez MRN: [id number] Date of Birth: 1941/08/28  Subjective/Objective:     Carolyn Valdez qualifies for new home oxygen per dx of CHF and an 02 sat of 85% at rest on room air. Referral called to [facility] at [facility] per [place] will have to follow patient home to gain access to her apartment . Also, need portable oxygen tank to be delivered to Carolyn Valdez at [facility] within 2 hours so that we can coordinate her discharge home with availability of a CNA from [place] today.                Action/Plan:   Expected Discharge Date:                  Expected Discharge Plan:     In-House Referral:     Discharge planning Services     Post Acute Care Choice:    Choice offered to:     DME Arranged:    DME Agency:     HH Arranged:    HH Agency:     Status of Service:     If discussed at [employer] of Stay Meetings, dates discussed:    Additional Comments:  Lajuane Leatham A, RN 09/05/2016, 11:03 AM

## 2016-09-05 NOTE — Progress Notes (Signed)
Clonopin 1 mg was held last night due to drowsiness. Patient rested well with no incident. Will continue to monitor.

## 2016-09-05 NOTE — Progress Notes (Signed)
Patient's pulse ox on 2 L O2 via [place] at rest = 97%. Pulse ox at rest on RA = 85%. According to case manager if patient below 88% on RA at rest walking O2 pulse ox test not needed. Carolyn Valdez [facility]

## 2016-09-05 NOTE — Care Management Note (Addendum)
Case Management Note  Patient Details  Name: Carolyn Valdez MRN: [id number] Date of Birth: 1941-06-17  Subjective/Objective:     At 1:30pm This writer notified Aldona Bar at [place]  [phone] that [name] is leaving [facility] at this time for transport to home with new oxygen. This Probation officer faxed all DME orders to [place] per agreement with [facility]. Called Mandy at [place] to have Shanon Brow deliver the home oxygen to [name]'s apartment ASAP.              Action/Plan:   Expected Discharge Date:                  Expected Discharge Plan:     In-House Referral:     Discharge planning Services     Post Acute Care Choice:    Choice offered to:     DME Arranged:    DME Agency:     HH Arranged:    HH Agency:     Status of Service:     If discussed at [employer] of Stay Meetings, dates discussed:    Additional Comments:  Hannan Tetzlaff A, RN 09/05/2016, 1:32 PM

## 2016-09-05 NOTE — Progress Notes (Signed)
[place] at [place] NAME: Carolyn Valdez    MR#:  [id number]  DATE OF BIRTH:  Aug 13, 1941  bp better today, patient denies any complaints .eager , to go home   CHIEF COMPLAINT:   Chief Complaint  Patient presents with  . Back Pain    REVIEW OF SYSTEMS:   ROS CONSTITUTIONAL: No fever, fatigue or weakness.  EYES: No blurred or double vision.  EARS, NOSE, AND THROAT: No tinnitus or ear pain.  RESPIRATORY: No cough, shortness of breath, wheezing or hemoptysis.  CARDIOVASCULAR: No chest pain, orthopnea, edema.  GASTROINTESTINAL: No nausea, vomiting, diarrhea or abdominal pain.  GENITOURINARY: No dysuria, hematuria.  ENDOCRINE: No polyuria, nocturia,  HEMATOLOGY: No anemia, easy bruising or bleeding SKIN: No rash or lesion. MUSCULOSKELETAL: c/o of back pain. NEUROLOGIC: No tingling, numbness, weakness.  PSYCHIATRY: No anxiety or depression.   DRUG ALLERGIES:   Allergies  Allergen Reactions  . Sulfa Antibiotics Hives  . Biaxin [Clarithromycin] Hives  . Eggs Or Egg-Derived Products Other (See Comments)    Pt states that her allergy test said she was allergic to eggs.    . Influenza A (H1n1) Monoval Vac Other (See Comments)    Pt states that she was told by her MD not to get the influenza vaccine.    . Morphine Other (See Comments)    Reaction:  Dizziness and confusion   . Pyridium [Phenazopyridine Hcl] Other (See Comments)    Reaction:  Unknown   . Ceftriaxone Anxiety  . Latex Rash  . Prednisone Rash  . Tape Rash    VITALS:  Blood pressure (!) 127/32, pulse 62, temperature 98.3 F (36.8 C), temperature source Oral, resp. rate 16, height 5\' 8"  (1.727 m), weight 130.6 kg (288 lb), SpO2 97 %.  PHYSICAL EXAMINATION:  GENERAL:  75 y.o.-year-old patient lying in the bed with no acute distress.  EYES: Pupils equal, round, reactive to light and accommodation. No scleral icterus. Extraocular muscles intact.  HEENT: Head atraumatic,  normocephalic. Oropharynx and nasopharynx clear.  NECK:  Supple, no jugular venous distention. No thyroid enlargement, no tenderness.  LUNGS: Normal breath sounds bilaterally, no wheezing, rales,rhonchi or crepitation. No use of accessory muscles of respiration.  CARDIOVASCULAR: S1, S2 normal. No murmurs, rubs, or gallops.  ABDOMEN: Soft, nontender, nondistended. Bowel sounds present. No organomegaly or mass.  EXTREMITIES: No pedal edema, cyanosis, or clubbing.  NEUROLOGIC: Cranial nerves II through XII are intact. Muscle strength 5/5 in all extremities. Sensation intact. Gait not checked.  PSYCHIATRIC: The patient is alert and oriented x 3.  SKIN: No obvious rash, lesion, or ulcer.    LABORATORY PANEL:   CBC  Recent Labs Lab 09/01/16 0434  WBC 6.7  HGB 10.8*  HCT 33.8*  PLT 305   ------------------------------------------------------------------------------------------------------------------  Chemistries   Recent Labs Lab 08/31/16 2145  09/04/16 0543  NA 136  < > 137  K 4.6  < > 3.9  CL 93*  < > 95*  CO2 33*  < > 35*  GLUCOSE 286*  < > 137*  BUN 51*  < > 42*  CREATININE 2.13*  < > 1.95*  CALCIUM 9.9  < > 9.7  AST 17  --   --   ALT 15  --   --   ALKPHOS 73  --   --   BILITOT 0.9  --   --   < > = values in this interval not displayed. ------------------------------------------------------------------------------------------------------------------  Cardiac Enzymes  Recent Labs Lab 08/31/16 2145  TROPONINI <0.03   ------------------------------------------------------------------------------------------------------------------  RADIOLOGY:  No results found.  EKG:   Orders placed or performed during the hospital encounter of 02/23/16  . ED EKG  . ED EKG  . EKG    ASSESSMENT AND PLAN:   1.acute  On  Chronic Diastolic Heart Failure: Stable at This Time. Seen by Cardiology. Echo Showed 55% Ejection Fraction. Follow-Up with Cardiology and CHF Clinic As an  Outpatient.Check oxygen saturations to assess for home oxygen need. #2MRSA uti;Spoke with ID, patient his doxycycline tablets for 10 days for MRSA UTI. #3 diabetes mellitus type 2: On Lantus, SSI with coverage.Hold the metformin at discharge because of her kidney disease: #4 .acute on chronic renal failure, chronic kidney disease stage III: Stable kidney function. #5 essential hypertension: Blood pressure is better today,adjusted  blood pressure medications.  #Deconditioning; physical therapy consult. Spoke with case manager  yesterday, patient refused physical therapy at home;She refused to participate with therapy at the skilled nursing facility  Discharge home today with the wheelchair, hospital bed, bedside, old, does have physical therapy, she can resume the physical therapy.   All the records are reviewed and case discussed with Care Management/Social Workerr. Management plans discussed with the patient, family and they are in agreement.  CODE STATUS: full  TOTAL TIME TAKING CARE OF THIS PATIENT: 35 minutes.      Epifanio Lesches M.D on 09/05/2016 at 10:40 AM  Between 7am to 6pm - Pager - [phone]  After 6pm go to [email or web address] - password [email or web address] [facility]  [place] [facility]  Office  [phone]  CC: Primary care physician; [name], DAVID Mamie Nick, MD   Note: This dictation was prepared with Dragon dictation along with smaller phrase technology. Any transcriptional errors that result from this process are unintentional.

## 2016-09-05 NOTE — Progress Notes (Signed)
Called for non-emergent patient transport at this time via EMS. Informed of need for oxygen tank. Wenda Low [facility]

## 2016-09-05 NOTE — Discharge Instructions (Signed)
Heart Failure Clinic appointment on September 17, 2016 at 11:00am with Darylene Price, [place]. Please call [phone] to reschedule.

## 2016-09-06 NOTE — Progress Notes (Signed)
Order Home health- medical therapy, nurse, nurse aide and social work Reason-congestive heart failure

## 2016-09-06 NOTE — Care Management (Signed)
Patient was discharged home 11/19. sister of the patient called to discussed equipment that was not received- BSC was too small and did not receive wheelchair.  This equipment had been ordered by [facility] and [facility]asion confirmed receipt of these scripts. Informed by Pietro Cassis with [facility] that agency did not receive home health orders for SN PT Aide SW OT.  It appears that new scripts were sent to [facility] for DME which had already been ordered.  Only script that was needed at discharge was to be sent home with the patient and not filled was for a hospital bed in the event patient changed her mind.  Patient did not receive her wheelchair because the script that was faxed to [facility] was for an electric wheelchair which is a longer referral process.  The previous order that [facility] already had was for a standard wheelchair.

## 2016-09-08 NOTE — Discharge Summary (Addendum)
Carolyn Valdez, is a 75 y.o. female  DOB 12-Aug-1941  MRN [id number].  Admission date:  08/31/2016  Admitting Physician  Harvie Bridge, DO  Discharge Date:  11/192017   Primary MD  Leonel Ramsay, MD  Recommendations for primary care physician for things to follow:   Follow-up with primary doctor in 1 week     Admission Diagnosis  Hypoxia [R09.02] Urinary tract infection without hematuria, site unspecified [N39.0] Acute renal failure, unspecified acute renal failure type ([place]) [N17.9] Acute on chronic congestive heart failure, unspecified congestive heart failure type ([place]) [I50.9]   Discharge Diagnosis  Hypoxia [R09.02] Urinary tract infection without hematuria, site unspecified [N39.0] Acute renal failure, unspecified acute renal failure type (HCC) [N17.9] Acute on chronic congestive heart failure, unspecified congestive heart failure type ([place]) [I50.9]   Active Problems:   Respiratory failure with hypoxia (HCC)   Pressure injury of skin      Past Medical History:  Diagnosis Date  . Anxiety   . Breast cancer ([place])    16 yrs ago  . CHF (congestive heart failure) ([place])   . Diabetes mellitus without complication ([place])   . DJD (degenerative joint disease)   . Dysuria   . H/O total knee replacement    right  . HTN (hypertension)   . Hypercholesteremia   . Kidney stone   . Obesity   . Recurrent urinary tract infection   . SVT (supraventricular tachycardia) ([place])   . Urinary incontinence   . Vaginal atrophy   . Yeast vaginitis     Past Surgical History:  Procedure Laterality Date  . CARDIAC CATHETERIZATION    . CHOLECYSTECTOMY    . FOOT SURGERY    . JOINT REPLACEMENT    . MASTECTOMY Right 1997  . NASAL SEPTUM SURGERY    . PERIPHERAL VASCULAR CATHETERIZATION N/A 07/08/2015   Procedure: PICC  Line Insertion;  Surgeon: Algernon Huxley, MD;  Location: [facility];  Service: Cardiovascular;  Laterality: N/A;  . TONSILLECTOMY    . Vocal cords         History of present illness and  Hospital Course:     Kindly see H&P for history of present illness and admission details, please review complete Labs, Consult reports and Test reports for all details in brief  HPI  from the history and physical done on the day of admission 75 year old female patient came in because of the back pain, dysuria. According to medical records patient had a hypoxia at [facility]. Patient had fever, increased urine frequency, dysuria. Did not have any other symptoms. Admitted to hospitalist service because of UTI, acute renal failure, CHF exacerbation.   Hospital Course   #1 acute on chronic diastolic heart failure. Patient had mild flareup of her CHF: Started on IV Lasix in the emergency room. Seen by cardiology. Patient had echo last month showed EF of 55%. No further cardiac workup is planned.  Acute respiratory failure with hypoxia,o2 sats on RA 85%at rest,qualified for home o2. Acute renal failure on chronic kidney disease stage III: Patient renal function improved with the monitoring closely, stopping the Lasix, patient did not require any Lasix further.  #3/MRSA UTI: Patient has just a history of recurrent UTIs. Patient admission WBC normal. UA abnormal with too many white count. Patient received a dose of Levaquin, changed to vancomycin. And urine culture showed more than 100,000 colonies of MRSA. Patient allergic to sulfa. Has chronic kidney disease so Macrobid is not used. And  MRSA resistant to: Clindamycin.., we discharged her with 10 day course of doxycycline.   #4 diabetes mellitus type 2: Continue Lantus,  Hold metformin because of renal failure  #5 essential hypertension: Blood pressure is soft, Adjusted the BP medicines at discharge. Decreased the dose of metoprolol, Lasix.,  Amlodipine  #6 deconditioning: Physical therapy saw the patient, patient refuses physical therapy at home: Discharging her home health physical therapy. Occupational therapy, nurse, aid.  #Diabetes mellitus type 2: Metformin stopped because of renal failure. Advised to continue Lantus alone.   Discharge Condition: Stable   Follow UP  Follow-up Information    Alisa Graff, FNP. Go on 09/17/2016.   Specialty:  Family Medicine Why:  at 11:00am to the [facility] Contact information: [place] [address] [place] [address] [phone]        Leonel Ramsay, MD Follow up in 1 week(s).   Specialty:  Infectious Diseases Why:  PLEASE CALL THE OFFICE AS SOON AS POSSIBLE TO SCHEDULE THIS APPOINTMENT Contact information: [place] [address] [phone]             Discharge Instructions  and  Discharge Medications       Medication List    STOP taking these medications   losartan 100 MG tablet Commonly known as:  COZAAR   metFORMIN 1000 MG tablet Commonly known as:  GLUCOPHAGE     TAKE these medications   amLODipine 2.5 MG tablet Commonly known as:  NORVASC Take 1 tablet (2.5 mg total) by mouth daily. What changed:  medication strength  how much to take   atorvastatin 20 MG tablet Commonly known as:  LIPITOR Take 20 mg by mouth at bedtime.   clonazePAM 1 MG tablet Commonly known as:  KLONOPIN Take 1 mg by mouth 3 (three) times daily.   conjugated estrogens vaginal cream Commonly known as:  PREMARIN Place 1 Applicatorful vaginally 3 (three) times a week. Pt uses on Monday, Wednesday, and Friday.   desmopressin 0.2 MG tablet Commonly known as:  DDAVP Take 1 tablet (0.2 mg total) by mouth at bedtime.   docusate sodium 100 MG capsule Commonly known as:  COLACE Take 200 mg by mouth 2 (two) times daily.   doxycycline 100 MG tablet Commonly known as:   VIBRA-TABS Take 1 tablet (100 mg total) by mouth every 12 (twelve) hours.   fexofenadine 180 MG tablet Commonly known as:  ALLEGRA Take 180 mg by mouth daily.   furosemide 20 MG tablet Commonly known as:  LASIX Take 1 tablet (20 mg total) by mouth daily. What changed:  medication strength  how much to take  when to take this   gabapentin 300 MG capsule Commonly known as:  NEURONTIN Take 300-600 mg by mouth 3 (three) times daily. Pt takes two capsules in the morning, one capsule at lunch, and two capsules in the evening.   hydrOXYzine 25 MG tablet Commonly known as:  ATARAX/VISTARIL Take 25 mg by mouth at bedtime.   insulin aspart 100 UNIT/ML injection Commonly known as:  novoLOG Inject 0-20 Units into the skin 3 (three) times daily with meals.   insulin aspart 100 UNIT/ML injection Commonly known as:  novoLOG Inject 0-5 Units into the skin at bedtime.   insulin glargine 100 UNIT/ML injection Commonly known as:  LANTUS Inject 50 Units into the skin at bedtime.   iron polysaccharides 150 MG capsule Commonly known as:  NIFEREX Take  150 mg by mouth 3 (three) times daily.   metoprolol succinate 25 MG 24 hr tablet Commonly known as:  TOPROL-XL Take 1 tablet (25 mg total) by mouth 2 (two) times daily. What changed:  medication strength  how much to take   nystatin powder Commonly known as:  MYCOSTATIN/NYSTOP Apply topically 3 (three) times daily.   omeprazole 40 MG capsule Commonly known as:  PRILOSEC Take 40 mg by mouth 2 (two) times daily.   pioglitazone 30 MG tablet Commonly known as:  ACTOS Take 30 mg by mouth daily.   potassium chloride SA 20 MEQ tablet Commonly known as:  K-DUR,KLOR-CON Take 1 tablet (20 mEq total) by mouth daily.   VESICARE 10 MG tablet Generic drug:  solifenacin Take 1 tablet (10 mg total) by mouth daily.   vitamin E 1000 UNIT capsule Take 1,000 Units by mouth daily.         Diet and Activity recommendation: See  Discharge Instructions above   Consults obtained - ID/physical therapy   Major procedures and Radiology Reports - PLEASE review detailed and final reports for all details, in brief -     Dg Chest 2 View  Result Date: 08/11/2016 CLINICAL DATA:  CHF, hypertension, diabetes EXAM: CHEST  2 VIEW COMPARISON:  08/09/2016 FINDINGS: Limited exam because of obese body habitus and sitting position during the AP film. Cardiomegaly evident with similar vascular congestion and diffuse interstitial pattern compatible with mild edema. Lung bases are obscured by overlying soft tissues. No enlarging effusion. Negative for pneumothorax. Trachea is midline. Atherosclerosis noted of the aorta. Degenerative changes of the spine and shoulders. IMPRESSION: Stable cardiomegaly with diffuse interstitial edema pattern. No significant interval change. Electronically Signed   By: Jerilynn Mages.  Shick M.D.   On: 08/11/2016 08:48   Dg Chest Portable 1 View  Result Date: 08/31/2016 CLINICAL DATA:  Hypoxia back pain EXAM: PORTABLE CHEST 1 VIEW COMPARISON:  08/11/2016 FINDINGS: There is stable cardiomegaly with aortic atherosclerosis and mild pulmonary vascular congestion and interstitial edema. No pneumonic consolidations noted. No significant pleural effusion. No pneumothorax identified. No suspicious osseous lesions. IMPRESSION: Stable cardiomegaly with aortic atherosclerosis. Mild pulmonary vascular congestion and interstitial edema. Electronically Signed   By: Ashley Royalty M.D.   On: 08/31/2016 21:29    Micro Results    Recent Results (from the past 240 hour(s))  Urine culture     Status: Abnormal   Collection Time: 08/31/16  9:11 PM  Result Value Ref Range Status   Specimen Description URINE, CLEAN CATCH  Final   Special Requests NONE  Final   Culture (A)  Final    >=100,000 COLONIES/mL METHICILLIN RESISTANT STAPHYLOCOCCUS AUREUS   Report Status 09/03/2016 FINAL  Final   Organism ID, Bacteria METHICILLIN RESISTANT  STAPHYLOCOCCUS AUREUS (A)  Final      Susceptibility   Methicillin resistant staphylococcus aureus - MIC*    CIPROFLOXACIN >=8 RESISTANT Resistant     GENTAMICIN <=0.5 SENSITIVE Sensitive     NITROFURANTOIN 32 SENSITIVE Sensitive     OXACILLIN >=4 RESISTANT Resistant     TETRACYCLINE 2 SENSITIVE Sensitive     VANCOMYCIN 1 SENSITIVE Sensitive     TRIMETH/SULFA <=10 SENSITIVE Sensitive     CLINDAMYCIN >=8 RESISTANT Resistant     RIFAMPIN <=0.5 SENSITIVE Sensitive     Inducible Clindamycin NEGATIVE Sensitive     * >=100,000 COLONIES/mL METHICILLIN RESISTANT STAPHYLOCOCCUS AUREUS  MRSA PCR Screening     Status: Abnormal   Collection Time: 09/01/16  2:45 AM  Result Value Ref Range Status   MRSA by PCR POSITIVE (A) NEGATIVE Final    Comment:        The GeneXpert MRSA Assay (FDA approved for NASAL specimens only), is one component of a comprehensive MRSA colonization surveillance program. It is not intended to diagnose MRSA infection nor to guide or monitor treatment for MRSA infections. RESULT CALLED TO, READ BACK BY AND VERIFIED WITH: LEXI MILLER AT 0502 09/01/16.PMH        Today   Subjective:   Santiago Graf today has no headache,no chest abdominal pain,no new weakness tingling or numbness, feels much better wants to go home today.   Objective:   Blood pressure (!) 136/36, pulse 60, temperature 98.3 F (36.8 C), temperature source Oral, resp. rate 19, height 5\' 8"  (1.727 m), weight 130.6 kg (288 lb), SpO2 92 %.  No intake or output data in the 24 hours ending 09/08/16 1728  Exam Awake Alert, Oriented x 3, No new F.N deficits, Normal affect [place].AT,PERRAL Supple Neck,No JVD, No cervical lymphadenopathy appriciated.  Symmetrical Chest wall movement, Good air movement bilaterally, CTAB RRR,No Gallops,Rubs or new Murmurs, No Parasternal Heave +ve B.Sounds, Abd Soft, Non tender, No organomegaly appriciated, No rebound -guarding or rigidity. No Cyanosis, Clubbing or edema, No  new Rash or bruise  Data Review   CBC w Diff:  Lab Results  Component Value Date   WBC 6.7 09/01/2016   HGB 10.8 (L) 09/01/2016   HGB 10.1 (L) 10/31/2014   HCT 33.8 (L) 09/01/2016   HCT 32.3 (L) 10/31/2014   PLT 305 09/01/2016   PLT 254 10/31/2014   LYMPHOPCT 16 08/31/2016   LYMPHOPCT 26.4 10/31/2014   MONOPCT 12 08/31/2016   MONOPCT 12.6 10/31/2014   EOSPCT 2 08/31/2016   EOSPCT 5.9 10/31/2014   BASOPCT 1 08/31/2016   BASOPCT 0.5 10/31/2014    CMP:  Lab Results  Component Value Date   NA 137 09/04/2016   NA 137 09/08/2014   K 3.9 09/04/2016   K 5.2 (H) 09/08/2014   CL 95 (L) 09/04/2016   CL 103 09/08/2014   CO2 35 (H) 09/04/2016   CO2 28 09/08/2014   BUN 42 (H) 09/04/2016   BUN 16 09/08/2014   CREATININE 1.95 (H) 09/04/2016   CREATININE 1.45 (H) 09/08/2014   PROT 7.6 08/31/2016   PROT 7.1 05/22/2014   ALBUMIN 3.4 (L) 08/31/2016   ALBUMIN 3.2 (L) 05/22/2014   BILITOT 0.9 08/31/2016   BILITOT 0.3 05/22/2014   ALKPHOS 73 08/31/2016   ALKPHOS 62 05/22/2014   AST 17 08/31/2016   AST 26 05/22/2014   ALT 15 08/31/2016   ALT 22 05/22/2014  .   Total Time in preparing paper work, data evaluation and todays exam - 39 minutes  Avontae Burkhead M.D on 09/05/2016 at 5:28 PM    Note: This dictation was prepared with Dragon dictation along with smaller phrase technology. Any transcriptional errors that result from this process are unintentional.

## 2016-09-11 ENCOUNTER — Encounter: Payer: Self-pay | Admitting: Urgent Care

## 2016-09-11 ENCOUNTER — Emergency Department
Admission: EM | Admit: 2016-09-11 | Discharge: 2016-09-12 | Disposition: A | Discharge status: Home / Self Care | Payer: Medicare Other | Attending: Emergency Medicine | Admitting: Emergency Medicine

## 2016-09-11 ENCOUNTER — Emergency Department: Payer: Medicare Other

## 2016-09-11 DIAGNOSIS — I509 Heart failure, unspecified: Secondary | ICD-10-CM | POA: Diagnosis not present

## 2016-09-11 DIAGNOSIS — I110 Hypertensive heart disease with heart failure: Secondary | ICD-10-CM | POA: Insufficient documentation

## 2016-09-11 DIAGNOSIS — Z794 Long term (current) use of insulin: Secondary | ICD-10-CM | POA: Diagnosis not present

## 2016-09-11 DIAGNOSIS — Z87891 Personal history of nicotine dependence: Secondary | ICD-10-CM | POA: Insufficient documentation

## 2016-09-11 DIAGNOSIS — R112 Nausea with vomiting, unspecified: Secondary | ICD-10-CM | POA: Insufficient documentation

## 2016-09-11 DIAGNOSIS — Z79899 Other long term (current) drug therapy: Secondary | ICD-10-CM | POA: Diagnosis not present

## 2016-09-11 DIAGNOSIS — E119 Type 2 diabetes mellitus without complications: Secondary | ICD-10-CM | POA: Insufficient documentation

## 2016-09-11 DIAGNOSIS — J189 Pneumonia, unspecified organism: Secondary | ICD-10-CM | POA: Diagnosis not present

## 2016-09-11 LAB — CBC
HEMATOCRIT: 37 % (ref 35.0–47.0)
HEMOGLOBIN: 12.1 g/dL (ref 12.0–16.0)
MCH: 27.2 pg (ref 26.0–34.0)
MCHC: 32.8 g/dL (ref 32.0–36.0)
MCV: 82.9 fL (ref 80.0–100.0)
Platelets: 247 10*3/uL (ref 150–440)
RBC: 4.46 MIL/uL (ref 3.80–5.20)
RDW: 16 % — ABNORMAL HIGH (ref 11.5–14.5)
WBC: 6.6 10*3/uL (ref 3.6–11.0)

## 2016-09-11 LAB — COMPREHENSIVE METABOLIC PANEL
ALBUMIN: 3.4 g/dL — AB (ref 3.5–5.0)
ALK PHOS: 68 U/L (ref 38–126)
ALT: 13 U/L — ABNORMAL LOW (ref 14–54)
ANION GAP: 6 (ref 5–15)
AST: 23 U/L (ref 15–41)
BILIRUBIN TOTAL: 0.6 mg/dL (ref 0.3–1.2)
BUN: 25 mg/dL — AB (ref 6–20)
CALCIUM: 10.2 mg/dL (ref 8.9–10.3)
CO2: 35 mmol/L — ABNORMAL HIGH (ref 22–32)
Chloride: 94 mmol/L — ABNORMAL LOW (ref 101–111)
Creatinine, Ser: 1.14 mg/dL — ABNORMAL HIGH (ref 0.44–1.00)
GFR calc Af Amer: 54 mL/min — ABNORMAL LOW (ref >60)
GFR calc non Af Amer: 47 mL/min — ABNORMAL LOW (ref >60)
GLUCOSE: 310 mg/dL — AB (ref 65–99)
Potassium: 4.3 mmol/L (ref 3.5–5.1)
Sodium: 135 mmol/L (ref 135–145)
TOTAL PROTEIN: 7.3 g/dL (ref 6.5–8.1)

## 2016-09-11 LAB — LIPASE, BLOOD: Lipase: 24 U/L (ref 11–51)

## 2016-09-11 MED ORDER — SODIUM CHLORIDE 0.9 % IV BOLUS (SEPSIS)
1000.0000 mL | Freq: Once | INTRAVENOUS | Status: AC
  Administered 2016-09-11: 1000 mL via INTRAVENOUS

## 2016-09-11 MED ORDER — GI COCKTAIL ~~LOC~~
ORAL | Status: AC
  Filled 2016-09-11: qty 30

## 2016-09-11 MED ORDER — GI COCKTAIL ~~LOC~~
30.0000 mL | Freq: Once | ORAL | Status: AC
Start: 2016-09-11 — End: 2016-09-11
  Administered 2016-09-11: 30 mL via ORAL

## 2016-09-11 MED ORDER — ONDANSETRON 4 MG PO TBDP: 4.0000 mg | ORAL_TABLET | Freq: Three times a day (TID) | ORAL | 0 refills | Status: DC | PRN

## 2016-09-11 MED ORDER — ONDANSETRON HCL 4 MG/2ML IJ SOLN
4.0000 mg | Freq: Once | INTRAMUSCULAR | Status: AC
  Administered 2016-09-11: 4 mg via INTRAVENOUS
  Filled 2016-09-11: qty 2

## 2016-09-11 MED ORDER — ONDANSETRON HCL 4 MG/2ML IJ SOLN
INTRAMUSCULAR | Status: AC
  Filled 2016-09-11: qty 2

## 2016-09-11 MED ORDER — LEVOFLOXACIN 750 MG PO TABS: 750.0000 mg | ORAL_TABLET | Freq: Every day | ORAL | 0 refills | Status: AC

## 2016-09-11 MED ORDER — ONDANSETRON HCL 4 MG/2ML IJ SOLN
4.0000 mg | Freq: Once | INTRAMUSCULAR | Status: AC
  Administered 2016-09-11: 4 mg via INTRAVENOUS

## 2016-09-11 NOTE — ED Provider Notes (Signed)
[facility] Emergency Department Provider Note  Time seen: 7:16 PM  I have reviewed the triage vital signs and the nursing notes.   HISTORY  Chief Complaint Nausea and Emesis    HPI Carolyn Valdez is a 75 y.o. female with a past medical history of CHF, diabetes, hypertension, hyperlipidemia, presents the emergency department for nausea or vomiting. According to EMS reported the patient's home health aide called for an ambulance tonight as the patient was nauseated. The patient states she has been having diarrhea for the past several days but stopped yesterday. States she has not "vomited" but has been "spitting up." Denies any fever. Denies abdominal pain. Denies dysuria. Denies any nausea currently. Patient does have a low pulse oximetry on room air, however it is in the mid to upper 90s on 2 L. Patient states she wears oxygen when she needs it at home.  Past Medical History:  Diagnosis Date  . Anxiety   . Breast cancer ([place])    16 yrs ago  . CHF (congestive heart failure) ([place])   . Diabetes mellitus without complication ([place])   . DJD (degenerative joint disease)   . Dysuria   . H/O total knee replacement    right  . HTN (hypertension)   . Hypercholesteremia   . Kidney stone   . Obesity   . Recurrent urinary tract infection   . SVT (supraventricular tachycardia) ([employer])   . Urinary incontinence   . Vaginal atrophy   . Yeast vaginitis     Patient Active Problem List   Diagnosis Date Noted  . Pressure injury of skin 09/02/2016  . Respiratory failure with hypoxia ([place]) 09/01/2016  . Lymphedema 08/16/2016  . CHF (congestive heart failure) ([place]) 08/09/2016  . Congestive heart failure ([place]) 11/25/2015  . Nocturia 11/05/2015  . Urinary frequency 11/05/2015  . Acute respiratory failure with hypoxia ([place]) 09/05/2015  . 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 Incontinence 04/20/2015  . Diabetes mellitus, type 2 ([place]) 04/17/2015  . ESBL (extended spectrum  beta-lactamase) producing bacteria infection 04/17/2015  . BP (high blood pressure) 04/17/2015  . Frequent UTI 04/17/2015  . Absence of bladder continence 04/17/2015  . Iron deficiency anemia 03/08/2015  . Absolute anemia 09/25/2014  . Abdominal pain, lower 09/25/2014  . Urge incontinence 02/19/2013  . FOM (frequency of micturition) 02/19/2013  . Bladder infection, chronic 08/11/2012  . Difficult or painful urination 07/26/2012  . Lower urinary tract infection 12/31/2011  . Diabetes mellitus ([place]) 12/31/2011    Past Surgical History:  Procedure Laterality Date  . CARDIAC CATHETERIZATION    . CHOLECYSTECTOMY    . FOOT SURGERY    . JOINT REPLACEMENT    . MASTECTOMY Right 1997  . NASAL SEPTUM SURGERY    . PERIPHERAL VASCULAR CATHETERIZATION N/A 07/08/2015   Procedure: PICC Line Insertion;  Surgeon: Algernon Huxley, MD;  Location: [facility];  Service: Cardiovascular;  Laterality: N/A;  . TONSILLECTOMY    . Vocal cords      Prior to Admission medications   Medication Sig Start Date End Date Taking? Authorizing Provider  amLODipine (NORVASC) 2.5 MG tablet Take 1 tablet (2.5 mg total) by mouth daily. 09/05/16   Epifanio Lesches, MD  atorvastatin (LIPITOR) 20 MG tablet Take 20 mg by mouth at bedtime.    Historical Provider, MD  clonazePAM (KLONOPIN) 1 MG tablet Take 1 mg by mouth 3 (three) times daily.    Historical Provider, MD  conjugated estrogens (PREMARIN) vaginal cream Place 1  Applicatorful vaginally 3 (three) times a week. Pt uses on Monday, Wednesday, and Friday.    Historical Provider, MD  desmopressin (DDAVP) 0.2 MG tablet Take 1 tablet (0.2 mg total) by mouth at bedtime. 11/05/15   [name] A McGowan, PA-C  docusate sodium (COLACE) 100 MG capsule Take 200 mg by mouth 2 (two) times daily.    Historical Provider, MD  doxycycline (VIBRA-TABS) 100 MG tablet Take 1 tablet (100 mg total) by mouth every 12 (twelve) hours. 09/05/16   Epifanio Lesches, MD  fexofenadine  (ALLEGRA) 180 MG tablet Take 180 mg by mouth daily.    Historical Provider, MD  furosemide (LASIX) 20 MG tablet Take 1 tablet (20 mg total) by mouth daily. 09/05/16   Epifanio Lesches, MD  gabapentin (NEURONTIN) 300 MG capsule Take 300-600 mg by mouth 3 (three) times daily. Pt takes two capsules in the morning, one capsule at lunch, and two capsules in the evening.    Historical Provider, MD  hydrOXYzine (ATARAX/VISTARIL) 25 MG tablet Take 25 mg by mouth at bedtime.     Historical Provider, MD  insulin aspart (NOVOLOG) 100 UNIT/ML injection Inject 0-20 Units into the skin 3 (three) times daily with meals. 09/05/16   Epifanio Lesches, MD  insulin aspart (NOVOLOG) 100 UNIT/ML injection Inject 0-5 Units into the skin at bedtime. 09/05/16   Epifanio Lesches, MD  insulin glargine (LANTUS) 100 UNIT/ML injection Inject 50 Units into the skin at bedtime.    Historical Provider, MD  iron polysaccharides (NIFEREX) 150 MG capsule Take 150 mg by mouth 3 (three) times daily.     Historical Provider, MD  metoprolol succinate (TOPROL-XL) 25 MG 24 hr tablet Take 1 tablet (25 mg total) by mouth 2 (two) times daily. 09/05/16   Epifanio Lesches, MD  nystatin (MYCOSTATIN/NYSTOP) powder Apply topically 3 (three) times daily. 09/05/16   Epifanio Lesches, MD  omeprazole (PRILOSEC) 40 MG capsule Take 40 mg by mouth 2 (two) times daily.    Historical Provider, MD  pioglitazone (ACTOS) 30 MG tablet Take 30 mg by mouth daily.    Historical Provider, MD  potassium chloride SA (K-DUR,KLOR-CON) 20 MEQ tablet Take 1 tablet (20 mEq total) by mouth daily. 08/13/16   Lytle Butte, MD  VESICARE 10 MG tablet Take 1 tablet (10 mg total) by mouth daily. 11/05/15   Nori Riis, PA-C  vitamin E 1000 UNIT capsule Take 1,000 Units by mouth daily.    Historical Provider, MD    Allergies  Allergen Reactions  . Sulfa Antibiotics Hives  . Biaxin [Clarithromycin] Hives  . Eggs Or Egg-Derived Products Other (See Comments)     Pt states that her allergy test said she was allergic to eggs.    . Influenza A (H1n1) Monoval Vac Other (See Comments)    Pt states that she was told by her MD not to get the influenza vaccine.    . Morphine Other (See Comments)    Reaction:  Dizziness and confusion   . Pyridium [Phenazopyridine Hcl] Other (See Comments)    Reaction:  Unknown   . Ceftriaxone Anxiety  . Latex Rash  . Prednisone Rash  . Tape Rash    Family History  Problem Relation Age of Onset  . Lung cancer Father   . Hematuria Mother   . Lung cancer Mother   . Kidney disease Neg Hx   . Bladder Cancer Neg Hx   . Breast cancer Neg Hx     Social History Social History  Substance  Use Topics  . Smoking status: Former Research scientist (life sciences)  . Smokeless tobacco: Former Systems developer    Quit date: 10/29/1995  . Alcohol use No    Review of Systems Constitutional: Negative for fever. Cardiovascular: Negative for chest pain. Respiratory: Negative for shortness of breath. Gastrointestinal: Negative for abdominal pain. Positive for nausea and vomiting. Positive for diarrhea which has resolved per patient. Musculoskeletal: Negative for back pain. Neurological: Negative for headache 10-point ROS otherwise negative.  ____________________________________________   PHYSICAL EXAM:  Constitutional: Alert and oriented. Well appearing and in no distress. Eyes: Normal exam ENT   Head: Normocephalic and atraumatic.   Mouth/Throat: Mucous membranes are moist. Cardiovascular: Normal rate, regular rhythm. No murmur Respiratory: Normal respiratory effort without tachypnea nor retractions. Breath sounds are clear  Gastrointestinal: Soft and nontender. No distention.   Musculoskeletal: Nontender with normal range of motion in all extremities.  Neurologic:  Normal speech and language. No gross focal neurologic deficits  Skin:  Skin is warm, dry and intact.  Psychiatric: Mood and affect are normal.    ____________________________________________      RADIOLOGY  X-ray shows vascular congestion with possible left airspace opacity.  ____________________________________________   INITIAL IMPRESSION / ASSESSMENT AND PLAN / ED COURSE  Pertinent labs & imaging results that were available during my care of the patient were reviewed by me and considered in my medical decision making (see chart for details).  The patient presents the emergency department with nausea and reported vomiting at home. Patient is a very poor historian. No abdominal tenderness to my examination. 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 We will check labs, IV hydrate, treat with Zofran and closely monitor while awaiting lab results.  X-ray shows vascular congestion, possible small left-sided pneumonia. We'll cover with Levaquin. Patient states nausea but has not vomited once in the emergency department. Overall she appears well, labs are largely at her baseline. Currently awaiting urinalysis results. If urinalysis is within normal limits we'll discharge for Zofran to be used as needed, we'll cover with Levaquin and have the patient follow up with her primary care doctor. Patient is agreeable to plan. She has home health aides in place currently.  Patient care signed out to Dr. Beather Arbour, urinalysis pending.  ____________________________________________   FINAL CLINICAL IMPRESSION(S) / ED DIAGNOSES  Nausea and vomiting Pneumonia   Harvest Dark, MD 09/11/16 2304

## 2016-09-11 NOTE — Discharge Instructions (Signed)
As we discussed please take a nausea medication as needed, as prescribed. Your x-ray shows a possible left-sided pneumonia. Please take antibiotic for the next 7 days as written. Return to the emergency department for any trouble breathing, fever or any other symptom personally concerning to yourself.

## 2016-09-11 NOTE — ED Notes (Signed)
Pt c/o nausea X 1 day. Pt reports having diarrhea X 3 days, however reports it stopped yesterday

## 2016-09-11 NOTE — ED Notes (Signed)
Myself, NT Sarah-Blake, RN Dawn attempted to in and out cath patient unsuccessfully. Urinary meatus was not visualized. Pt had large amount of feces, and thick yellowish discharge in vaginal area. Pt cleaned thoroughly with Sage wipes and iodine. Ubag placed. MD Paduchowski informed

## 2016-09-11 NOTE — ED Notes (Signed)
Pt reports she wears oxygen at home 2L [place]

## 2016-09-11 NOTE — ED Notes (Signed)
Patient states, "My necklaces are broke. I need you all to find my necklaces. You [EMS] have my necklaces". RN asked patient how many necklaces she had when she left home; patient stated "three". RN checked patient's neck and noted three necklaces in place; verified by Gwenlyn Saran, RN as well. Primary nurse made aware that patient has concerns about her jewelery despite be advised that it was in place.

## 2016-09-11 NOTE — ED Triage Notes (Addendum)
Patient presents to the ED from home. Patient with c/o increased nausea, vomiting, and indigestion over the last 1.5 days. Patient reports that she has had diarrhea "all week"; stopped yesterday. EMS reports that the patient has been taking antiemetic and antacids; unable to report the names of the medications that she has been taking. EMS reports that patient has not vomited with them, however has been "dry heaving some". Patient with current cellulitis and BLE edema per EMS. Of note, she was here x 1 week ago and PCR (+) for MRSA in her URINE; currently taking Doxycycline. CBG 320 with EMS. Patient states, "I am not nauseated because of my blood sugar because I always run high. It is not because of my blood pressure because I have been 300 before and not felt like this".

## 2016-09-12 LAB — URINALYSIS COMPLETE WITH MICROSCOPIC (ARMC ONLY)
BILIRUBIN URINE: NEGATIVE
GLUCOSE, UA: 150 mg/dL — AB
NITRITE: NEGATIVE
Protein, ur: 100 mg/dL — AB
SPECIFIC GRAVITY, URINE: 1.019 (ref 1.005–1.030)
pH: 5 (ref 5.0–8.0)

## 2016-09-12 MED ORDER — LEVOFLOXACIN 750 MG PO TABS
750.0000 mg | ORAL_TABLET | Freq: Once | ORAL | Status: AC
  Administered 2016-09-12: 750 mg via ORAL

## 2016-09-12 MED ORDER — LEVOFLOXACIN 500 MG PO TABS
ORAL_TABLET | ORAL | Status: AC
  Administered 2016-09-12: 750 mg via ORAL
  Filled 2016-09-12: qty 2

## 2016-09-12 NOTE — ED Notes (Signed)
In and out cath performed by Georgiann Cocker and Eliezer Lofts RN

## 2016-09-12 NOTE — ED Notes (Signed)
RN checked Ubag. No urine at this time. RN will continue to monitor

## 2016-09-12 NOTE — ED Notes (Signed)
RN checked Ubag for urine. No urine at this time. Will continue to monitor

## 2016-09-12 NOTE — ED Provider Notes (Signed)
-----------------------------------------   12:48 AM on 09/12/2016 -----------------------------------------  Updated patient of urinalysis result. Previous provider already place patient on Levaquin. No further emesis and she has tolerated PO. Strict return precautions given. Patient verbalizes understanding and agrees with plan of care.   Paulette Blanch, MD 09/12/16 [phone]

## 2016-09-12 NOTE — ED Notes (Signed)
Reviewed d/c instructions, follow-up care, prescriptions with pt and caregiver. Pt and caregiver verbalized understanding.

## 2016-09-17 ENCOUNTER — Ambulatory Visit: Payer: Medicare Other | Admitting: Family

## 2016-09-24 ENCOUNTER — Inpatient Hospital Stay
Admission: EM | Admit: 2016-09-24 | Discharge: 2016-10-26 | DRG: 689 | Disposition: A | Discharge status: Skilled Nursing Facility | Payer: Medicare Other | Attending: Internal Medicine | Admitting: Internal Medicine

## 2016-09-24 ENCOUNTER — Emergency Department: Payer: Medicare Other

## 2016-09-24 ENCOUNTER — Encounter: Payer: Self-pay | Admitting: Emergency Medicine

## 2016-09-24 DIAGNOSIS — Z96651 Presence of right artificial knee joint: Secondary | ICD-10-CM | POA: Diagnosis present

## 2016-09-24 DIAGNOSIS — R1084 Generalized abdominal pain: Secondary | ICD-10-CM

## 2016-09-24 DIAGNOSIS — R531 Weakness: Secondary | ICD-10-CM | POA: Diagnosis not present

## 2016-09-24 DIAGNOSIS — E1122 Type 2 diabetes mellitus with diabetic chronic kidney disease: Secondary | ICD-10-CM | POA: Diagnosis present

## 2016-09-24 DIAGNOSIS — K3184 Gastroparesis: Secondary | ICD-10-CM | POA: Diagnosis present

## 2016-09-24 DIAGNOSIS — K922 Gastrointestinal hemorrhage, unspecified: Secondary | ICD-10-CM | POA: Diagnosis not present

## 2016-09-24 DIAGNOSIS — Z6839 Body mass index (BMI) 39.0-39.9, adult: Secondary | ICD-10-CM

## 2016-09-24 DIAGNOSIS — F05 Delirium due to known physiological condition: Secondary | ICD-10-CM

## 2016-09-24 DIAGNOSIS — N183 Chronic kidney disease, stage 3 (moderate): Secondary | ICD-10-CM | POA: Diagnosis present

## 2016-09-24 DIAGNOSIS — N390 Urinary tract infection, site not specified: Secondary | ICD-10-CM | POA: Diagnosis present

## 2016-09-24 DIAGNOSIS — R569 Unspecified convulsions: Secondary | ICD-10-CM

## 2016-09-24 DIAGNOSIS — Z66 Do not resuscitate: Secondary | ICD-10-CM | POA: Diagnosis present

## 2016-09-24 DIAGNOSIS — K6289 Other specified diseases of anus and rectum: Secondary | ICD-10-CM

## 2016-09-24 DIAGNOSIS — E1143 Type 2 diabetes mellitus with diabetic autonomic (poly)neuropathy: Secondary | ICD-10-CM | POA: Diagnosis present

## 2016-09-24 DIAGNOSIS — Z87891 Personal history of nicotine dependence: Secondary | ICD-10-CM | POA: Diagnosis not present

## 2016-09-24 DIAGNOSIS — L03115 Cellulitis of right lower limb: Secondary | ICD-10-CM | POA: Diagnosis present

## 2016-09-24 DIAGNOSIS — Z853 Personal history of malignant neoplasm of breast: Secondary | ICD-10-CM | POA: Diagnosis not present

## 2016-09-24 DIAGNOSIS — Z794 Long term (current) use of insulin: Secondary | ICD-10-CM

## 2016-09-24 DIAGNOSIS — G4700 Insomnia, unspecified: Secondary | ICD-10-CM | POA: Diagnosis present

## 2016-09-24 DIAGNOSIS — I130 Hypertensive heart and chronic kidney disease with heart failure and stage 1 through stage 4 chronic kidney disease, or unspecified chronic kidney disease: Secondary | ICD-10-CM | POA: Diagnosis present

## 2016-09-24 DIAGNOSIS — E785 Hyperlipidemia, unspecified: Secondary | ICD-10-CM | POA: Diagnosis present

## 2016-09-24 DIAGNOSIS — Z9049 Acquired absence of other specified parts of digestive tract: Secondary | ICD-10-CM

## 2016-09-24 DIAGNOSIS — F419 Anxiety disorder, unspecified: Secondary | ICD-10-CM | POA: Diagnosis present

## 2016-09-24 DIAGNOSIS — K5900 Constipation, unspecified: Secondary | ICD-10-CM | POA: Diagnosis present

## 2016-09-24 DIAGNOSIS — Z7401 Bed confinement status: Secondary | ICD-10-CM

## 2016-09-24 DIAGNOSIS — K219 Gastro-esophageal reflux disease without esophagitis: Secondary | ICD-10-CM | POA: Diagnosis present

## 2016-09-24 DIAGNOSIS — G9341 Metabolic encephalopathy: Secondary | ICD-10-CM | POA: Diagnosis present

## 2016-09-24 DIAGNOSIS — Z79899 Other long term (current) drug therapy: Secondary | ICD-10-CM | POA: Diagnosis not present

## 2016-09-24 DIAGNOSIS — F323 Major depressive disorder, single episode, severe with psychotic features: Secondary | ICD-10-CM

## 2016-09-24 DIAGNOSIS — R339 Retention of urine, unspecified: Secondary | ICD-10-CM

## 2016-09-24 DIAGNOSIS — K626 Ulcer of anus and rectum: Secondary | ICD-10-CM | POA: Diagnosis present

## 2016-09-24 DIAGNOSIS — F333 Major depressive disorder, recurrent, severe with psychotic symptoms: Secondary | ICD-10-CM | POA: Diagnosis present

## 2016-09-24 DIAGNOSIS — R4182 Altered mental status, unspecified: Secondary | ICD-10-CM | POA: Diagnosis present

## 2016-09-24 DIAGNOSIS — R627 Adult failure to thrive: Secondary | ICD-10-CM | POA: Diagnosis present

## 2016-09-24 DIAGNOSIS — R410 Disorientation, unspecified: Secondary | ICD-10-CM

## 2016-09-24 DIAGNOSIS — D649 Anemia, unspecified: Secondary | ICD-10-CM | POA: Diagnosis present

## 2016-09-24 DIAGNOSIS — R350 Frequency of micturition: Secondary | ICD-10-CM | POA: Diagnosis present

## 2016-09-24 DIAGNOSIS — Z515 Encounter for palliative care: Secondary | ICD-10-CM | POA: Diagnosis not present

## 2016-09-24 DIAGNOSIS — M6281 Muscle weakness (generalized): Secondary | ICD-10-CM

## 2016-09-24 DIAGNOSIS — R338 Other retention of urine: Secondary | ICD-10-CM

## 2016-09-24 DIAGNOSIS — I872 Venous insufficiency (chronic) (peripheral): Secondary | ICD-10-CM | POA: Diagnosis present

## 2016-09-24 DIAGNOSIS — I5032 Chronic diastolic (congestive) heart failure: Secondary | ICD-10-CM | POA: Diagnosis present

## 2016-09-24 DIAGNOSIS — Z7189 Other specified counseling: Secondary | ICD-10-CM

## 2016-09-24 DIAGNOSIS — R509 Fever, unspecified: Secondary | ICD-10-CM

## 2016-09-24 DIAGNOSIS — R0689 Other abnormalities of breathing: Secondary | ICD-10-CM

## 2016-09-24 DIAGNOSIS — Z8614 Personal history of Methicillin resistant Staphylococcus aureus infection: Secondary | ICD-10-CM

## 2016-09-24 LAB — COMPREHENSIVE METABOLIC PANEL
ALBUMIN: 3.5 g/dL (ref 3.5–5.0)
ALT: 10 U/L — AB (ref 14–54)
AST: 19 U/L (ref 15–41)
Alkaline Phosphatase: 63 U/L (ref 38–126)
Anion gap: 4 — ABNORMAL LOW (ref 5–15)
BUN: 44 mg/dL — AB (ref 6–20)
CHLORIDE: 97 mmol/L — AB (ref 101–111)
CO2: 38 mmol/L — AB (ref 22–32)
CREATININE: 1.41 mg/dL — AB (ref 0.44–1.00)
Calcium: 10.5 mg/dL — ABNORMAL HIGH (ref 8.9–10.3)
GFR calc Af Amer: 42 mL/min — ABNORMAL LOW (ref >60)
GFR calc non Af Amer: 36 mL/min — ABNORMAL LOW (ref >60)
Glucose, Bld: 292 mg/dL — ABNORMAL HIGH (ref 65–99)
POTASSIUM: 4.7 mmol/L (ref 3.5–5.1)
SODIUM: 139 mmol/L (ref 135–145)
Total Bilirubin: 0.6 mg/dL (ref 0.3–1.2)
Total Protein: 7.6 g/dL (ref 6.5–8.1)

## 2016-09-24 LAB — URINALYSIS, COMPLETE (UACMP) WITH MICROSCOPIC
BILIRUBIN URINE: NEGATIVE
GLUCOSE, UA: 50 mg/dL — AB
Ketones, ur: NEGATIVE mg/dL
Nitrite: NEGATIVE
PROTEIN: 100 mg/dL — AB
RBC / HPF: NONE SEEN RBC/hpf (ref 0–5)
SPECIFIC GRAVITY, URINE: 1.016 (ref 1.005–1.030)
pH: 5 (ref 5.0–8.0)

## 2016-09-24 LAB — CBC
HCT: 40 % (ref 35.0–47.0)
Hemoglobin: 12.8 g/dL (ref 12.0–16.0)
MCH: 27.2 pg (ref 26.0–34.0)
MCHC: 32.1 g/dL (ref 32.0–36.0)
MCV: 84.7 fL (ref 80.0–100.0)
PLATELETS: 240 10*3/uL (ref 150–440)
RBC: 4.72 MIL/uL (ref 3.80–5.20)
RDW: 17.2 % — AB (ref 11.5–14.5)
WBC: 7.8 10*3/uL (ref 3.6–11.0)

## 2016-09-24 LAB — GLUCOSE, CAPILLARY: GLUCOSE-CAPILLARY: 197 mg/dL — AB (ref 65–99)

## 2016-09-24 LAB — TYPE AND SCREEN
ABO/RH(D): O POS
Antibody Screen: NEGATIVE

## 2016-09-24 LAB — HEMOGLOBIN: Hemoglobin: 12.2 g/dL (ref 12.0–16.0)

## 2016-09-24 LAB — LIPASE, BLOOD: LIPASE: 27 U/L (ref 11–51)

## 2016-09-24 MED ORDER — VANCOMYCIN HCL 10 G IV SOLR
1250.0000 mg | Freq: Once | INTRAVENOUS | Status: AC
  Administered 2016-09-24: 1250 mg via INTRAVENOUS
  Filled 2016-09-24: qty 1250

## 2016-09-24 MED ORDER — LEVOFLOXACIN IN D5W 500 MG/100ML IV SOLN
500.0000 mg | Freq: Once | INTRAVENOUS | Status: AC
  Administered 2016-09-24: 500 mg via INTRAVENOUS
  Filled 2016-09-24: qty 100

## 2016-09-24 MED ORDER — LORATADINE 10 MG PO TABS
10.0000 mg | ORAL_TABLET | Freq: Every day | ORAL | Status: DC
  Administered 2016-09-25 – 2016-10-26 (×23): 10 mg via ORAL
  Filled 2016-09-24 (×27): qty 1

## 2016-09-24 MED ORDER — NYSTATIN 100000 UNIT/GM EX POWD
Freq: Three times a day (TID) | CUTANEOUS | Status: DC
  Administered 2016-09-24 – 2016-10-25 (×85): via TOPICAL
  Filled 2016-09-24 (×4): qty 15

## 2016-09-24 MED ORDER — INSULIN ASPART 100 UNIT/ML ~~LOC~~ SOLN
0.0000 [IU] | Freq: Every day | SUBCUTANEOUS | Status: DC
  Administered 2016-10-06 – 2016-10-08 (×2): 2 [IU] via SUBCUTANEOUS
  Administered 2016-10-10: 3 [IU] via SUBCUTANEOUS
  Administered 2016-10-11 – 2016-10-12 (×2): 2 [IU] via SUBCUTANEOUS
  Administered 2016-10-13: 3 [IU] via SUBCUTANEOUS
  Administered 2016-10-15 – 2016-10-18 (×4): 2 [IU] via SUBCUTANEOUS
  Administered 2016-10-20: 3 [IU] via SUBCUTANEOUS
  Administered 2016-10-20 – 2016-10-21 (×2): 2 [IU] via SUBCUTANEOUS
  Filled 2016-09-24: qty 3
  Filled 2016-09-24: qty 2
  Filled 2016-09-24: qty 3
  Filled 2016-09-24 (×6): qty 2
  Filled 2016-09-24: qty 3
  Filled 2016-09-24: qty 2
  Filled 2016-09-24: qty 3
  Filled 2016-09-24: qty 2

## 2016-09-24 MED ORDER — ONDANSETRON HCL 4 MG PO TABS
4.0000 mg | ORAL_TABLET | Freq: Four times a day (QID) | ORAL | Status: DC | PRN
  Administered 2016-09-28 – 2016-09-29 (×2): 4 mg via ORAL
  Filled 2016-09-24 (×2): qty 1

## 2016-09-24 MED ORDER — AMLODIPINE BESYLATE 5 MG PO TABS
2.5000 mg | ORAL_TABLET | Freq: Every day | ORAL | Status: DC
  Filled 2016-09-24 (×2): qty 1

## 2016-09-24 MED ORDER — MEROPENEM 1 G IV SOLR
1.0000 g | Freq: Three times a day (TID) | INTRAVENOUS | Status: DC
  Filled 2016-09-24 (×3): qty 1

## 2016-09-24 MED ORDER — ACETAMINOPHEN 325 MG PO TABS
650.0000 mg | ORAL_TABLET | Freq: Four times a day (QID) | ORAL | Status: DC | PRN
  Administered 2016-09-25 – 2016-10-26 (×7): 650 mg via ORAL
  Filled 2016-09-24 (×7): qty 2

## 2016-09-24 MED ORDER — INSULIN GLARGINE 100 UNIT/ML ~~LOC~~ SOLN
50.0000 [IU] | Freq: Every day | SUBCUTANEOUS | Status: DC
  Administered 2016-09-24 – 2016-10-03 (×9): 50 [IU] via SUBCUTANEOUS
  Filled 2016-09-24 (×11): qty 0.5

## 2016-09-24 MED ORDER — SODIUM CHLORIDE 0.9 % IV SOLN
80.0000 mg | Freq: Once | INTRAVENOUS | Status: AC
  Administered 2016-09-24: 80 mg via INTRAVENOUS
  Filled 2016-09-24: qty 80

## 2016-09-24 MED ORDER — FUROSEMIDE 20 MG PO TABS: 20.0000 mg | ORAL_TABLET | Freq: Every day | ORAL | Status: DC

## 2016-09-24 MED ORDER — VITAMIN E 45 MG (100 UNIT) PO CAPS
1000.0000 [IU] | ORAL_CAPSULE | Freq: Every day | ORAL | Status: DC
  Administered 2016-09-25 – 2016-10-26 (×19): 1000 [IU] via ORAL
  Filled 2016-09-24 (×27): qty 2

## 2016-09-24 MED ORDER — METOPROLOL SUCCINATE ER 25 MG PO TB24
25.0000 mg | ORAL_TABLET | Freq: Two times a day (BID) | ORAL | Status: DC
  Administered 2016-09-24 – 2016-09-28 (×3): 25 mg via ORAL
  Filled 2016-09-24 (×4): qty 1

## 2016-09-24 MED ORDER — DESMOPRESSIN ACETATE 0.2 MG PO TABS
0.2000 mg | ORAL_TABLET | Freq: Every day | ORAL | Status: DC
  Administered 2016-09-24 – 2016-10-25 (×30): 0.2 mg via ORAL
  Filled 2016-09-24 (×31): qty 1

## 2016-09-24 MED ORDER — ESTROGENS, CONJUGATED 0.625 MG/GM VA CREA
1.0000 | TOPICAL_CREAM | VAGINAL | Status: DC
  Administered 2016-09-24 – 2016-10-25 (×10): 1 via VAGINAL
  Filled 2016-09-24 (×2): qty 30

## 2016-09-24 MED ORDER — ENOXAPARIN SODIUM 40 MG/0.4ML ~~LOC~~ SOLN
40.0000 mg | Freq: Two times a day (BID) | SUBCUTANEOUS | Status: DC
  Administered 2016-09-24 – 2016-10-21 (×52): 40 mg via SUBCUTANEOUS
  Filled 2016-09-24 (×52): qty 0.4

## 2016-09-24 MED ORDER — INSULIN ASPART 100 UNIT/ML ~~LOC~~ SOLN
0.0000 [IU] | Freq: Three times a day (TID) | SUBCUTANEOUS | Status: DC
  Administered 2016-09-25: 2 [IU] via SUBCUTANEOUS
  Administered 2016-09-25 (×2): 1 [IU] via SUBCUTANEOUS
  Administered 2016-09-26: 5 [IU] via SUBCUTANEOUS
  Administered 2016-09-26 – 2016-09-27 (×4): 2 [IU] via SUBCUTANEOUS
  Administered 2016-09-28 (×2): 3 [IU] via SUBCUTANEOUS
  Administered 2016-09-29: 5 [IU] via SUBCUTANEOUS
  Administered 2016-09-29 – 2016-10-02 (×5): 1 [IU] via SUBCUTANEOUS
  Administered 2016-10-04: 2 [IU] via SUBCUTANEOUS
  Administered 2016-10-05 – 2016-10-06 (×5): 1 [IU] via SUBCUTANEOUS
  Administered 2016-10-06 – 2016-10-08 (×4): 2 [IU] via SUBCUTANEOUS
  Administered 2016-10-08: 3 [IU] via SUBCUTANEOUS
  Administered 2016-10-08: 1 [IU] via SUBCUTANEOUS
  Administered 2016-10-09: 5 [IU] via SUBCUTANEOUS
  Administered 2016-10-09: 3 [IU] via SUBCUTANEOUS
  Administered 2016-10-09 – 2016-10-10 (×3): 2 [IU] via SUBCUTANEOUS
  Administered 2016-10-10: 1 [IU] via SUBCUTANEOUS
  Administered 2016-10-11: 5 [IU] via SUBCUTANEOUS
  Administered 2016-10-11: 3 [IU] via SUBCUTANEOUS
  Administered 2016-10-11 – 2016-10-12 (×3): 5 [IU] via SUBCUTANEOUS
  Administered 2016-10-12: 7 [IU] via SUBCUTANEOUS
  Administered 2016-10-13 (×2): 3 [IU] via SUBCUTANEOUS
  Filled 2016-09-24: qty 2
  Filled 2016-09-24 (×2): qty 3
  Filled 2016-09-24: qty 2
  Filled 2016-09-24 (×3): qty 3
  Filled 2016-09-24: qty 1
  Filled 2016-09-24: qty 5
  Filled 2016-09-24 (×4): qty 1
  Filled 2016-09-24 (×2): qty 2
  Filled 2016-09-24: qty 7
  Filled 2016-09-24: qty 3
  Filled 2016-09-24: qty 2
  Filled 2016-09-24: qty 1
  Filled 2016-09-24: qty 2
  Filled 2016-09-24: qty 5
  Filled 2016-09-24: qty 1
  Filled 2016-09-24 (×3): qty 2
  Filled 2016-09-24 (×2): qty 1
  Filled 2016-09-24: qty 2
  Filled 2016-09-24: qty 1
  Filled 2016-09-24: qty 2
  Filled 2016-09-24: qty 1
  Filled 2016-09-24: qty 3
  Filled 2016-09-24 (×2): qty 1
  Filled 2016-09-24: qty 3
  Filled 2016-09-24: qty 2
  Filled 2016-09-24 (×3): qty 5
  Filled 2016-09-24: qty 2
  Filled 2016-09-24: qty 5
  Filled 2016-09-24: qty 2
  Filled 2016-09-24: qty 1

## 2016-09-24 MED ORDER — ONDANSETRON HCL 4 MG/2ML IJ SOLN
4.0000 mg | Freq: Four times a day (QID) | INTRAMUSCULAR | Status: DC | PRN
  Administered 2016-09-27: 4 mg via INTRAVENOUS
  Filled 2016-09-24 (×3): qty 2

## 2016-09-24 MED ORDER — PANTOPRAZOLE SODIUM 40 MG PO TBEC
40.0000 mg | DELAYED_RELEASE_TABLET | Freq: Two times a day (BID) | ORAL | Status: DC
  Administered 2016-09-24 – 2016-10-26 (×56): 40 mg via ORAL
  Filled 2016-09-24 (×60): qty 1

## 2016-09-24 MED ORDER — ATORVASTATIN CALCIUM 20 MG PO TABS
20.0000 mg | ORAL_TABLET | Freq: Every day | ORAL | Status: DC
  Administered 2016-09-24 – 2016-10-08 (×15): 20 mg via ORAL
  Filled 2016-09-24 (×15): qty 1

## 2016-09-24 MED ORDER — ACETAMINOPHEN 650 MG RE SUPP
650.0000 mg | Freq: Four times a day (QID) | RECTAL | Status: DC | PRN
  Administered 2016-10-21: 650 mg via RECTAL
  Filled 2016-09-24: qty 1

## 2016-09-24 MED ORDER — POLYSACCHARIDE IRON COMPLEX 150 MG PO CAPS
150.0000 mg | ORAL_CAPSULE | Freq: Three times a day (TID) | ORAL | Status: DC
  Administered 2016-09-24 – 2016-10-26 (×70): 150 mg via ORAL
  Filled 2016-09-24 (×80): qty 1

## 2016-09-24 MED ORDER — SODIUM CHLORIDE 0.9 % IV SOLN
8.0000 mg/h | INTRAVENOUS | Status: DC
  Administered 2016-09-24: 8 mg/h via INTRAVENOUS
  Filled 2016-09-24: qty 80

## 2016-09-24 MED ORDER — MEROPENEM-SODIUM CHLORIDE 1 GM/50ML IV SOLR
1.0000 g | Freq: Three times a day (TID) | INTRAVENOUS | Status: DC
  Administered 2016-09-25 – 2016-09-27 (×8): 1 g via INTRAVENOUS
  Filled 2016-09-24 (×12): qty 50

## 2016-09-24 MED ORDER — SODIUM CHLORIDE 0.9 % IV SOLN
1250.0000 mg | INTRAVENOUS | Status: DC
  Administered 2016-09-25 – 2016-09-27 (×4): 1250 mg via INTRAVENOUS
  Filled 2016-09-24 (×4): qty 1250

## 2016-09-24 MED ORDER — POTASSIUM CHLORIDE CRYS ER 20 MEQ PO TBCR
20.0000 meq | EXTENDED_RELEASE_TABLET | Freq: Every day | ORAL | Status: DC
  Administered 2016-09-26 – 2016-10-26 (×24): 20 meq via ORAL
  Filled 2016-09-24 (×29): qty 1

## 2016-09-24 NOTE — ED Triage Notes (Signed)
Arrives from home via EMS.  C/O urinary frequency and dark stools.  Patient has recently been treated for UTI and has finished ANTBX.  States symptoms had initially improved but returned this morning.  Patient lives at home with caregiver (sister).

## 2016-09-24 NOTE — ED Notes (Signed)
Turned.  Linen changed. Skin care given.

## 2016-09-24 NOTE — ED Notes (Signed)
Care taker at bedside.  States since Wednesday patient has become weak with episodes of confusion.  Also, black stools that started yesterday.

## 2016-09-24 NOTE — H&P (Signed)
[facility] [place] NAME: Carolyn Valdez    MR#:  [id number]  DATE OF BIRTH:  1941/03/19  DATE OF ADMISSION:  09/24/2016  PRIMARY CARE PHYSICIAN: Leonel Ramsay, MD   REQUESTING/REFERRING PHYSICIAN: Dr. Harvest Dark  CHIEF COMPLAINT:   Chief Complaint  Patient presents with  . Dysuria    HISTORY OF PRESENT ILLNESS:  Carolyn Valdez  is a 75 y.o. female with a known history of multiple medical problems including diastolic congestive heart failure, arthritis, bilateral lower extremity edema from venous insufficiency, multiple admissions for recurrent UTIs, hypertension, diabetes mellitus brought in from home after a recent discharge secondary to altered mental status. Patient was recently discharged 10 days ago from the hospital after being treated for MRSA UTI with oral doxycycline. She has been at home with home health and doing at baseline. Her sister who is at bedside provides most of the history. Patient was noted to be confused last night. Denies any fevers or chills. Had an episode of nausea and vomiting this morning. Patient is very confused at this time. Patient was retaining urine and was noted to have almost a liter of urine in the bladder. Foley catheter placed here. Cultures are sent and patient is being admitted. Urine looks infected again. Sister also complains of dark stools in the last couple of days.  PAST MEDICAL HISTORY:   Past Medical History:  Diagnosis Date  . Anxiety   . Breast cancer ([place])    16 yrs ago  . CHF (congestive heart failure) ([place])   . Diabetes mellitus without complication ([place])   . DJD (degenerative joint disease)   . Dysuria   . H/O total knee replacement    right  . HTN (hypertension)   . Hypercholesteremia   . Kidney stone   . Obesity   . Recurrent urinary tract infection   . SVT (supraventricular tachycardia) ([place])   . Urinary incontinence   . Vaginal atrophy   . Yeast vaginitis      PAST SURGICAL HISTORY:   Past Surgical History:  Procedure Laterality Date  . CARDIAC CATHETERIZATION    . CHOLECYSTECTOMY    . FOOT SURGERY    . JOINT REPLACEMENT    . MASTECTOMY Right 1997  . NASAL SEPTUM SURGERY    . PERIPHERAL VASCULAR CATHETERIZATION N/A 07/08/2015   Procedure: PICC Line Insertion;  Surgeon: Algernon Huxley, MD;  Location: [facility];  Service: Cardiovascular;  Laterality: N/A;  . TONSILLECTOMY    . Vocal cords      SOCIAL HISTORY:   Social History  Substance Use Topics  . Smoking status: Former Research scientist (life sciences)  . Smokeless tobacco: Former Systems developer    Quit date: 10/29/1995  . Alcohol use No    FAMILY HISTORY:   Family History  Problem Relation Age of Onset  . Lung cancer Father   . Hematuria Mother   . Lung cancer Mother   . Kidney disease Neg Hx   . Bladder Cancer Neg Hx   . Breast cancer Neg Hx     DRUG ALLERGIES:   Allergies  Allergen Reactions  . Sulfa Antibiotics Hives  . Biaxin [Clarithromycin] Hives  . Eggs Or Egg-Derived Products Other (See Comments)    Pt states that her allergy test said she was allergic to eggs.    . Influenza A (H1n1) Monoval Vac Other (See Comments)    Pt states that she was told by her MD not to  get the influenza vaccine.    . Morphine Other (See Comments)    Reaction:  Dizziness and confusion   . Pyridium [Phenazopyridine Hcl] Other (See Comments)    Reaction:  Unknown   . Ceftriaxone Anxiety  . Latex Rash  . Prednisone Rash  . Tape Rash    REVIEW OF SYSTEMS:   Review of Systems  Unable to perform ROS: Mental acuity    MEDICATIONS AT HOME:   Prior to Admission medications   Medication Sig Start Date End Date Taking? Authorizing Provider  amLODipine (NORVASC) 2.5 MG tablet Take 1 tablet (2.5 mg total) by mouth daily. 09/05/16  Yes Epifanio Lesches, MD  atorvastatin (LIPITOR) 20 MG tablet Take 20 mg by mouth at bedtime.   Yes Historical Provider, MD  clonazePAM (KLONOPIN) 1 MG tablet Take 1 mg by  mouth 3 (three) times daily.   Yes Historical Provider, MD  conjugated estrogens (PREMARIN) vaginal cream Place 1 Applicatorful vaginally 3 (three) times a week. Pt uses on Monday, Wednesday, and Friday.   Yes Historical Provider, MD  desmopressin (DDAVP) 0.2 MG tablet Take 1 tablet (0.2 mg total) by mouth at bedtime. 11/05/15  Yes Shannon A McGowan, PA-C  docusate sodium (COLACE) 100 MG capsule Take 200 mg by mouth 2 (two) times daily.   Yes Historical Provider, MD  fexofenadine (ALLEGRA) 180 MG tablet Take 180 mg by mouth daily.   Yes Historical Provider, MD  furosemide (LASIX) 20 MG tablet Take 1 tablet (20 mg total) by mouth daily. 09/05/16  Yes Epifanio Lesches, MD  gabapentin (NEURONTIN) 300 MG capsule Take 300-600 mg by mouth 3 (three) times daily. Pt takes two capsules in the morning, one capsule at lunch, and two capsules in the evening.   Yes Historical Provider, MD  hydrOXYzine (ATARAX/VISTARIL) 25 MG tablet Take 25 mg by mouth at bedtime.    Yes Historical Provider, MD  insulin aspart (NOVOLOG) 100 UNIT/ML injection Inject 0-20 Units into the skin 3 (three) times daily with meals. 09/05/16  Yes Epifanio Lesches, MD  insulin aspart (NOVOLOG) 100 UNIT/ML injection Inject 0-5 Units into the skin at bedtime. 09/05/16  Yes Epifanio Lesches, MD  insulin glargine (LANTUS) 100 UNIT/ML injection Inject 50 Units into the skin at bedtime.   Yes Historical Provider, MD  iron polysaccharides (NIFEREX) 150 MG capsule Take 150 mg by mouth 3 (three) times daily.    Yes Historical Provider, MD  metoprolol succinate (TOPROL-XL) 25 MG 24 hr tablet Take 1 tablet (25 mg total) by mouth 2 (two) times daily. 09/05/16  Yes Epifanio Lesches, MD  nystatin (MYCOSTATIN/NYSTOP) powder Apply topically 3 (three) times daily. 09/05/16  Yes Epifanio Lesches, MD  omeprazole (PRILOSEC) 40 MG capsule Take 40 mg by mouth 2 (two) times daily.   Yes Historical Provider, MD  ondansetron (ZOFRAN ODT) 4 MG  disintegrating tablet Take 1 tablet (4 mg total) by mouth every 8 (eight) hours as needed for nausea or vomiting. 09/11/16  Yes Harvest Dark, MD  pioglitazone (ACTOS) 30 MG tablet Take 30 mg by mouth daily.   Yes Historical Provider, MD  potassium chloride SA (K-DUR,KLOR-CON) 20 MEQ tablet Take 1 tablet (20 mEq total) by mouth daily. 08/13/16  Yes Lytle [place], MD  VESICARE 10 MG tablet Take 1 tablet (10 mg total) by mouth daily. 11/05/15  Yes Shannon A McGowan, PA-C  vitamin E 1000 UNIT capsule Take 1,000 Units by mouth daily.   Yes Historical Provider, MD      VITAL SIGNS:  Blood pressure (!) 159/112, pulse 61, temperature 98.9 F (37.2 C), temperature source Oral, resp. rate 18, height 5\' 8"  (1.727 m), weight 131.5 kg (290 lb), SpO2 100 %.  PHYSICAL EXAMINATION:   Physical Exam  GENERAL:  75 y.o.-year-old patient lying in the bed with no acute distress. Very lethargic EYES: Pupils equal, round, reactive to light and accommodation. No scleral icterus. Extraocular muscles intact.  HEENT: Head atraumatic, normocephalic. Oropharynx and nasopharynx clear.  NECK:  Supple, no jugular venous distention. No thyroid enlargement, no tenderness.  LUNGS: Normal breath sounds bilaterally, no wheezing, rales,rhonchi or crepitation. No use of accessory muscles of respiration. Decreased bibasilar breath sounds noted. CARDIOVASCULAR: S1, S2 normal. No rubs, or gallops. 2/6 systolic murmur present. ABDOMEN: Soft, nontender, nondistended. Bowel sounds present. No organomegaly or mass.  EXTREMITIES: No cyanosis, or clubbing. 3+ bilateral pedal edema noted. NEUROLOGIC: arousable, following some simple commands when aroused but confused. PSYCHIATRIC: The patient is lethargic, confused, arousable to deep sternal rub SKIN: No obvious rash, lesion, or ulcer.   LABORATORY PANEL:   CBC  Recent Labs Lab 09/24/16 1215  WBC 7.8  HGB 12.8  HCT 40.0  PLT 240    ------------------------------------------------------------------------------------------------------------------  Chemistries   Recent Labs Lab 09/24/16 1215  NA 139  K 4.7  CL 97*  CO2 38*  GLUCOSE 292*  BUN 44*  CREATININE 1.41*  CALCIUM 10.5*  AST 19  ALT 10*  ALKPHOS 63  BILITOT 0.6   ------------------------------------------------------------------------------------------------------------------  Cardiac Enzymes No results for input(s): TROPONINI in the last 168 hours. ------------------------------------------------------------------------------------------------------------------  RADIOLOGY:  Ct Head Wo Contrast  Result Date: 09/24/2016 CLINICAL DATA:  Recent UTI. Weakness and confusion. History of breast cancer. EXAM: CT HEAD WITHOUT CONTRAST TECHNIQUE: Contiguous axial images were obtained from the base of the skull through the vertex without intravenous contrast. COMPARISON:  None. FINDINGS: Brain: No subdural, epidural, or subarachnoid hemorrhage. Cerebellum, brainstem, and basal cisterns are normal. Ventricles and sulci are mildly prominent but within normal limits for age. Mild white matter changes. No acute cortical ischemia or infarct. No mass, mass effect, or midline shift. Vascular: Calcified atherosclerosis in the intracranial carotid arteries. Skull: Normal. Negative for fracture or focal lesion. Sinuses/Orbits: No acute finding. Other: None. IMPRESSION: 1. No acute intracranial process. Electronically Signed   By: Dorise Bullion III M.D   On: 09/24/2016 13:57    EKG:   Orders placed or performed during the hospital encounter of 02/23/16  . ED EKG  . ED EKG  . EKG    IMPRESSION AND PLAN:   Carolyn Valdez  is a 75 y.o. female with a known history of multiple medical problems including diastolic congestive heart failure, arthritis, bilateral lower extremity edema from venous insufficiency, multiple admissions for recurrent UTIs, hypertension, diabetes  mellitus brought in from home after a recent discharge secondary to altered mental status.  #1 altered mental status-metabolic encephalopathy. And tried to get an ABG, patient got agitated -CT of the head negative for any acute findings. No focal deficits at this time. -Continue to monitor neurologically. Could be from underlying infection. -Blood and urine cultures ordered. On vancomycin and meropenem due to history of MRSA UTI and also ESBL UTI. -For urinary retention, Foley has been placed. Discontinue Foley tomorrow and do a voiding trial. -Avoid any sedatives.  #2 melena-dark stools according to sister. Hemoglobin is stable. -Check hemoglobin every 8 hours. Patient is also on iron tablets. -Check stool for Hemoccult. -Continue PPI twice a day  #3 diabetes mellitus-on Lantus, continue.  Also added sliding scale insulin  #4 diastolic congestive heart failure-recent echo from October 2017 with normal EF. -Continue low-dose Lasix for maintenance. Has chronic lower extremity edema from venous insufficiency  #5 hypertension-continue her Toprol and Lasix  #6 DVT prophylaxis-on Lovenox   All the records are reviewed and case discussed with ED provider. Management plans discussed with the patient, family and they are in agreement.  CODE STATUS: Full Code  TOTAL TIME TAKING CARE OF THIS PATIENT: 50 minutes.    Gladstone Lighter M.D on 09/24/2016 at 4:37 PM  Between 7am to 6pm - Pager - [phone]  After 6pm go to [email or web address] - password [email or web address] [facility]  Office  [phone]  CC: Primary care physician; Leonel Ramsay, MD

## 2016-09-24 NOTE — Progress Notes (Addendum)
ANTIBIOTIC CONSULT NOTE - INITIAL  Pharmacy Consult for Meropenem, Vancomycin  Indication: UTI  Allergies  Allergen Reactions  . Sulfa Antibiotics Hives  . Biaxin [Clarithromycin] Hives  . Eggs Or Egg-Derived Products Other (See Comments)    Pt states that her allergy test said she was allergic to eggs.    . Influenza A (H1n1) Monoval Vac Other (See Comments)    Pt states that she was told by her MD not to get the influenza vaccine.    . Morphine Other (See Comments)    Reaction:  Dizziness and confusion   . Pyridium [Phenazopyridine Hcl] Other (See Comments)    Reaction:  Unknown   . Ceftriaxone Anxiety  . Latex Rash  . Prednisone Rash  . Tape Rash    Patient Measurements: Height: 5\' 8"  (172.7 cm) Weight: 290 lb (131.5 kg) IBW/kg (Calculated) : 63.9 Adjusted Body Weight: 90.94 kg   Vital Signs: Temp: 98.9 F (37.2 C) (12/08 1200) Temp Source: Oral (12/08 1200) BP: 159/112 (12/08 1430) Pulse Rate: 61 (12/08 1430) Intake/Output from previous day: No intake/output data recorded. Intake/Output from this shift: No intake/output data recorded.  Labs:  Recent Labs  09/24/16 1215 09/24/16 1835  WBC 7.8  --   HGB 12.8 12.2  PLT 240  --   CREATININE 1.41*  --    Estimated Creatinine Clearance: 51 mL/min (by C-G formula based on SCr of 1.41 mg/dL (H)). No results for input(s): VANCOTROUGH, VANCOPEAK, VANCORANDOM, GENTTROUGH, GENTPEAK, GENTRANDOM, TOBRATROUGH, TOBRAPEAK, TOBRARND, AMIKACINPEAK, AMIKACINTROU, AMIKACIN in the last 72 hours.   Microbiology: Recent Results (from the past 720 hour(s))  Urine culture     Status: Abnormal   Collection Time: 08/31/16  9:11 PM  Result Value Ref Range Status   Specimen Description URINE, CLEAN CATCH  Final   Special Requests NONE  Final   Culture (A)  Final    >=100,000 COLONIES/mL METHICILLIN RESISTANT STAPHYLOCOCCUS AUREUS   Report Status 09/03/2016 FINAL  Final   Organism ID, Bacteria METHICILLIN RESISTANT STAPHYLOCOCCUS  AUREUS (A)  Final      Susceptibility   Methicillin resistant staphylococcus aureus - MIC*    CIPROFLOXACIN >=8 RESISTANT Resistant     GENTAMICIN <=0.5 SENSITIVE Sensitive     NITROFURANTOIN 32 SENSITIVE Sensitive     OXACILLIN >=4 RESISTANT Resistant     TETRACYCLINE 2 SENSITIVE Sensitive     VANCOMYCIN 1 SENSITIVE Sensitive     TRIMETH/SULFA <=10 SENSITIVE Sensitive     CLINDAMYCIN >=8 RESISTANT Resistant     RIFAMPIN <=0.5 SENSITIVE Sensitive     Inducible Clindamycin NEGATIVE Sensitive     * >=100,000 COLONIES/mL METHICILLIN RESISTANT STAPHYLOCOCCUS AUREUS  MRSA PCR Screening     Status: Abnormal   Collection Time: 09/01/16  2:45 AM  Result Value Ref Range Status   MRSA by PCR POSITIVE (A) NEGATIVE Final    Comment:        The GeneXpert MRSA Assay (FDA approved for NASAL specimens only), is one component of a comprehensive MRSA colonization surveillance program. It is not intended to diagnose MRSA infection nor to guide or monitor treatment for MRSA infections. RESULT CALLED TO, READ BACK BY AND VERIFIED WITH: LEXI MILLER AT 0502 09/01/16.PMH     Medical History: Past Medical History:  Diagnosis Date  . Anxiety   . Breast cancer ([place])    16 yrs ago  . CHF (congestive heart failure) ([place])   . Diabetes mellitus without complication ([place])   . DJD (degenerative joint disease)   .  Dysuria   . H/O total knee replacement    right  . HTN (hypertension)   . Hypercholesteremia   . Kidney stone   . Obesity   . Recurrent urinary tract infection   . SVT (supraventricular tachycardia) ([place])   . Urinary incontinence   . Vaginal atrophy   . Yeast vaginitis     Medications:  Prescriptions Prior to Admission  Medication Sig Dispense Refill Last Dose  . amLODipine (NORVASC) 2.5 MG tablet Take 1 tablet (2.5 mg total) by mouth daily. 30 tablet 0 09/24/2016 at 0900  . atorvastatin (LIPITOR) 20 MG tablet Take 20 mg by mouth at bedtime.   09/23/2016 at 2100  . clonazePAM  (KLONOPIN) 1 MG tablet Take 1 mg by mouth 3 (three) times daily.   09/24/2016 at 0900  . conjugated estrogens (PREMARIN) vaginal cream Place 1 Applicatorful vaginally 3 (three) times a week. Pt uses on Monday, Wednesday, and Friday.   Past Week at Unknown time  . desmopressin (DDAVP) 0.2 MG tablet Take 1 tablet (0.2 mg total) by mouth at bedtime. 90 tablet 3 09/23/2016 at 2100  . docusate sodium (COLACE) 100 MG capsule Take 200 mg by mouth 2 (two) times daily.   09/24/2016 at 0900  . fexofenadine (ALLEGRA) 180 MG tablet Take 180 mg by mouth daily.   09/24/2016 at 0900  . furosemide (LASIX) 20 MG tablet Take 1 tablet (20 mg total) by mouth daily. 30 tablet 0 09/24/2016 at 0900  . gabapentin (NEURONTIN) 300 MG capsule Take 300-600 mg by mouth 3 (three) times daily. Pt takes two capsules in the morning, one capsule at lunch, and two capsules in the evening.   09/24/2016 at 0900  . hydrOXYzine (ATARAX/VISTARIL) 25 MG tablet Take 25 mg by mouth at bedtime.    09/23/2016 at 2100  . insulin aspart (NOVOLOG) 100 UNIT/ML injection Inject 0-20 Units into the skin 3 (three) times daily with meals. 10 mL 11 09/24/2016 at 1130  . insulin aspart (NOVOLOG) 100 UNIT/ML injection Inject 0-5 Units into the skin at bedtime. 10 mL 11 09/23/2016 at 2100  . insulin glargine (LANTUS) 100 UNIT/ML injection Inject 50 Units into the skin at bedtime.   09/23/2016 at 2100  . iron polysaccharides (NIFEREX) 150 MG capsule Take 150 mg by mouth 3 (three) times daily.    09/24/2016 at 0900  . metoprolol succinate (TOPROL-XL) 25 MG 24 hr tablet Take 1 tablet (25 mg total) by mouth 2 (two) times daily. 30 tablet 0 09/24/2016 at 0900  . nystatin (MYCOSTATIN/NYSTOP) powder Apply topically 3 (three) times daily. 15 g 0 09/24/2016 at 0900  . omeprazole (PRILOSEC) 40 MG capsule Take 40 mg by mouth 2 (two) times daily.   09/24/2016 at 0900  . ondansetron (ZOFRAN ODT) 4 MG disintegrating tablet Take 1 tablet (4 mg total) by mouth every 8 (eight) hours as  needed for nausea or vomiting. 20 tablet 0 prn at prn  . pioglitazone (ACTOS) 30 MG tablet Take 30 mg by mouth daily.   09/24/2016 at 0900  . potassium chloride SA (K-DUR,KLOR-CON) 20 MEQ tablet Take 1 tablet (20 mEq total) by mouth daily. 30 tablet 0 09/24/2016 at 0900  . VESICARE 10 MG tablet Take 1 tablet (10 mg total) by mouth daily. 90 tablet 3 09/24/2016 at 0900  . vitamin E 1000 UNIT capsule Take 1,000 Units by mouth daily.   09/24/2016 at 0900   Assessment: Pharmacy consulted to dose vanc and meropenem for UTI, possibly MRSA or ESBL.  CrCl = 51 ml/min Ke = 0.047 hr-1 T1/2 = 14.9 hrs Vd = 63.6 L   Goal of Therapy:  Vancomycin trough level 15-20 mcg/ml  Plan:  Expected duration 7 days with resolution of temperature and/or normalization of WBC   Meropenem 1 gm IV Q8H ordered to start on 12/8 @ 19:00.  Vancomycin 1250 mg IV X 1 given on 12/8 @ 2300 Vancomycin 1250 mg IV Q18H ordered to start on 12/9 @ [place]. [place], [place].D., BCPS Clinical Pharmacist 09/24/2016,7:14 PM

## 2016-09-24 NOTE — ED Provider Notes (Signed)
[facility] Emergency Department Provider Note  Time seen: 1:08 PM  I have reviewed the triage vital signs and the nursing notes.   HISTORY  Chief Complaint Dysuria    HPI Carolyn Valdez is a 75 y.o. female with multiple medical issues who presents the emergency department with complaints of dysuria, urinary frequency. According to the patient's caregiver the patient recently was diagnosed with urinary tract infection and placed on antibiotics which resolved. She states over the past 2 days she has once again been feeling urinary frequency and dysuria and is concerned that her urinary tract infection is back. As a secondary complaint the patient states she has been having trouble gripping things in her hand but does not recall which hand she is having trouble. She also states she has been having dark stool for the past 2 or 3 days and been feeling weak. Patient has a history of GI bleed in the past but cannot tell me when. Denies any new abdominal pain, states mild lower abdominal pain which she states is chronic. Patient denies known fever.  Past Medical History:  Diagnosis Date  . Anxiety   . Breast cancer ([place])    16 yrs ago  . CHF (congestive heart failure) ([place])   . Diabetes mellitus without complication ([place])   . DJD (degenerative joint disease)   . Dysuria   . H/O total knee replacement    right  . HTN (hypertension)   . Hypercholesteremia   . Kidney stone   . Obesity   . Recurrent urinary tract infection   . SVT (supraventricular tachycardia) ([place])   . Urinary incontinence   . Vaginal atrophy   . Yeast vaginitis     Patient Active Problem List   Diagnosis Date Noted  . Pressure injury of skin 09/02/2016  . Respiratory failure with hypoxia ([place]) 09/01/2016  . Lymphedema 08/16/2016  . CHF (congestive heart failure) ([place]) 08/09/2016  . Congestive heart failure ([place]) 11/25/2015  . Nocturia 11/05/2015  . Urinary frequency 11/05/2015  . Acute  respiratory failure with hypoxia ([place]) 09/05/2015  . 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 Incontinence 04/20/2015  . Diabetes mellitus, type 2 ([place]) 04/17/2015  . ESBL (extended spectrum beta-lactamase) producing bacteria infection 04/17/2015  . BP (high blood pressure) 04/17/2015  . Frequent UTI 04/17/2015  . Absence of bladder continence 04/17/2015  . Iron deficiency anemia 03/08/2015  . Absolute anemia 09/25/2014  . Abdominal pain, lower 09/25/2014  . Urge incontinence 02/19/2013  . FOM (frequency of micturition) 02/19/2013  . Bladder infection, chronic 08/11/2012  . Difficult or painful urination 07/26/2012  . Lower urinary tract infection 12/31/2011  . Diabetes mellitus ([place]) 12/31/2011    Past Surgical History:  Procedure Laterality Date  . CARDIAC CATHETERIZATION    . CHOLECYSTECTOMY    . FOOT SURGERY    . JOINT REPLACEMENT    . MASTECTOMY Right 1997  . NASAL SEPTUM SURGERY    . PERIPHERAL VASCULAR CATHETERIZATION N/A 07/08/2015   Procedure: PICC Line Insertion;  Surgeon: Algernon Huxley, MD;  Location: [facility];  Service: Cardiovascular;  Laterality: N/A;  . TONSILLECTOMY    . Vocal cords      Prior to Admission medications   Medication Sig Start Date End Date Taking? Authorizing Provider  amLODipine (NORVASC) 2.5 MG tablet Take 1 tablet (2.5 mg total) by mouth daily. 09/05/16   Epifanio Lesches, MD  atorvastatin (LIPITOR) 20 MG tablet Take 20 mg by mouth at bedtime.  Historical Provider, MD  clonazePAM (KLONOPIN) 1 MG tablet Take 1 mg by mouth 3 (three) times daily.    Historical Provider, MD  conjugated estrogens (PREMARIN) vaginal cream Place 1 Applicatorful vaginally 3 (three) times a week. Pt uses on Monday, Wednesday, and Friday.    Historical Provider, MD  desmopressin (DDAVP) 0.2 MG tablet Take 1 tablet (0.2 mg total) by mouth at bedtime. 11/05/15   [name] A McGowan, PA-C  docusate sodium (COLACE) 100 MG capsule Take 200 mg by mouth 2 (two) times daily.     Historical Provider, MD  doxycycline (VIBRA-TABS) 100 MG tablet Take 1 tablet (100 mg total) by mouth every 12 (twelve) hours. 09/05/16   Epifanio Lesches, MD  fexofenadine (ALLEGRA) 180 MG tablet Take 180 mg by mouth daily.    Historical Provider, MD  furosemide (LASIX) 20 MG tablet Take 1 tablet (20 mg total) by mouth daily. 09/05/16   Epifanio Lesches, MD  gabapentin (NEURONTIN) 300 MG capsule Take 300-600 mg by mouth 3 (three) times daily. Pt takes two capsules in the morning, one capsule at lunch, and two capsules in the evening.    Historical Provider, MD  hydrOXYzine (ATARAX/VISTARIL) 25 MG tablet Take 25 mg by mouth at bedtime.     Historical Provider, MD  insulin aspart (NOVOLOG) 100 UNIT/ML injection Inject 0-20 Units into the skin 3 (three) times daily with meals. 09/05/16   Epifanio Lesches, MD  insulin aspart (NOVOLOG) 100 UNIT/ML injection Inject 0-5 Units into the skin at bedtime. 09/05/16   Epifanio Lesches, MD  insulin glargine (LANTUS) 100 UNIT/ML injection Inject 50 Units into the skin at bedtime.    Historical Provider, MD  iron polysaccharides (NIFEREX) 150 MG capsule Take 150 mg by mouth 3 (three) times daily.     Historical Provider, MD  metoprolol succinate (TOPROL-XL) 25 MG 24 hr tablet Take 1 tablet (25 mg total) by mouth 2 (two) times daily. 09/05/16   Epifanio Lesches, MD  nystatin (MYCOSTATIN/NYSTOP) powder Apply topically 3 (three) times daily. 09/05/16   Epifanio Lesches, MD  omeprazole (PRILOSEC) 40 MG capsule Take 40 mg by mouth 2 (two) times daily.    Historical Provider, MD  ondansetron (ZOFRAN ODT) 4 MG disintegrating tablet Take 1 tablet (4 mg total) by mouth every 8 (eight) hours as needed for nausea or vomiting. 09/11/16   Harvest Dark, MD  pioglitazone (ACTOS) 30 MG tablet Take 30 mg by mouth daily.    Historical Provider, MD  potassium chloride SA (K-DUR,KLOR-CON) 20 MEQ tablet Take 1 tablet (20 mEq total) by mouth daily. 08/13/16   Lytle Butte, MD  VESICARE 10 MG tablet Take 1 tablet (10 mg total) by mouth daily. 11/05/15   Nori Riis, PA-C  vitamin E 1000 UNIT capsule Take 1,000 Units by mouth daily.    Historical Provider, MD    Allergies  Allergen Reactions  . Sulfa Antibiotics Hives  . Biaxin [Clarithromycin] Hives  . Eggs Or Egg-Derived Products Other (See Comments)    Pt states that her allergy test said she was allergic to eggs.    . Influenza A (H1n1) Monoval Vac Other (See Comments)    Pt states that she was told by her MD not to get the influenza vaccine.    . Morphine Other (See Comments)    Reaction:  Dizziness and confusion   . Pyridium [Phenazopyridine Hcl] Other (See Comments)    Reaction:  Unknown   . Ceftriaxone Anxiety  . Latex Rash  .  Prednisone Rash  . Tape Rash    Family History  Problem Relation Age of Onset  . Lung cancer Father   . Hematuria Mother   . Lung cancer Mother   . Kidney disease Neg Hx   . Bladder Cancer Neg Hx   . Breast cancer Neg Hx     Social History Social History  Substance Use Topics  . Smoking status: Former Research scientist (life sciences)  . Smokeless tobacco: Former Systems developer    Quit date: 10/29/1995  . Alcohol use No    Review of Systems Constitutional: Negative for fever. Cardiovascular: Negative for chest pain. Respiratory: Negative for shortness of breath. Gastrointestinal: Mild lower abdominal pain which has been ongoing for weeks or months. Negative for nausea, vomiting, diarrhea. Genitourinary: Positive for urinary frequency and dysuria. Musculoskeletal: Negative for back pain. Neurological: Negative for headache 10-point ROS otherwise negative.  ____________________________________________   PHYSICAL EXAM:  VITAL SIGNS: ED Triage Vitals  Enc Vitals Group     BP 09/24/16 1200 (!) 157/61     Pulse --      Resp 09/24/16 1200 (!) 21     Temp 09/24/16 1200 98.9 F (37.2 C)     Temp Source 09/24/16 1200 Oral     SpO2 09/24/16 1200 94 %     Weight 09/24/16 1159  290 lb (131.5 kg)     Height 09/24/16 1159 5\' 8"  (1.727 m)     Head Circumference --      Peak Flow --      Pain Score 09/24/16 1159 0     Pain Loc --      Pain Edu? --      Excl. in [place]? --     Constitutional: Alert and oriented. Well appearing and in no distress. Eyes: Normal exam ENT   Head: Normocephalic and atraumatic.   Mouth/Throat: Mucous membranes are moist. Cardiovascular: Normal rate, regular rhythm. No murmur Respiratory: Normal respiratory effort without tachypnea nor retractions. Breath sounds are clear  Gastrointestinal: Soft, mild suprapubic tenderness palpation. No rebound or guarding. No distention. Obese. Musculoskeletal: Nontender with normal range of motion in all extremities.  Neurologic:  Normal speech and language. No gross focal neurologic deficits  Skin:  Skin is warm, dry and intact.  Psychiatric: Mood and affect are normal.   ____________________________________________    INITIAL IMPRESSION / ASSESSMENT AND PLAN / ED COURSE  Pertinent labs & imaging results that were available during my care of the patient were reviewed by me and considered in my medical decision making (see chart for details).  Patient presents to the emergency department multiple complaints. Patient does have mild suprapubic tenderness palpation states dysuria and urinary frequency. We will obtain a urinalysis. Patient has very dark stool on rectal examination strongly guaiac positive. States a history of GI bleeding in the past but cannot tell me when this was.  Patient denies nausea or vomiting. We will obtain labs, and closer monitoring in the emergency department. Patient states for the past several days she has been having some difficulty gripping things in her hand, no appreciable grip deficit. We will obtain a CT scan of the head to further evaluate.  Greater than 1 L on bladder scan, Foley catheter will be placed.  Urinalysis consistent with urinary tract infection.  Urine culture has been added. We will place the patient on IV antibiotics and admitted to the hospital for further treatment. ____________________________________________   FINAL CLINICAL IMPRESSION(S) / ED DIAGNOSES  Weakness GI bleed Urinary tract infection Acute  urinary retention    Harvest Dark, MD 09/24/16 [phone]

## 2016-09-24 NOTE — Progress Notes (Signed)
Anticoagulation monitoring(Lovenox):  75 yo  female ordered Lovenox 40 mg Q24h  Filed Weights   09/24/16 1159  Weight: 290 lb (131.5 kg)   BMI 44.2    Lab Results  Component Value Date   CREATININE 1.41 (H) 09/24/2016   CREATININE 1.14 (H) 09/11/2016   CREATININE 1.95 (H) 09/04/2016   Estimated Creatinine Clearance: 51 mL/min (by C-G formula based on SCr of 1.41 mg/dL (H)). Hemoglobin & Hematocrit     Component Value Date/Time   HGB 12.8 09/24/2016 1215   HGB 10.1 (L) 10/31/2014 1400   HCT 40.0 09/24/2016 1215   HCT 32.3 (L) 10/31/2014 1400     Per Protocol for Patient with estCrcl > 30 ml/min and BMI > 40, will transition to Lovenox 40 mg Q12h.

## 2016-09-24 NOTE — ED Notes (Signed)
Patient refusing to let respiratory obtain ABG. MD made aware.

## 2016-09-24 NOTE — ED Notes (Signed)
Patient denies pain and is resting comfortably.  

## 2016-09-24 NOTE — ED Notes (Signed)
Bladder scan shows >937ml of volume. MD made aware. New orders placed

## 2016-09-25 ENCOUNTER — Inpatient Hospital Stay: Payer: Medicare Other

## 2016-09-25 LAB — CBC
HCT: 36.1 % (ref 35.0–47.0)
Hemoglobin: 11.9 g/dL — ABNORMAL LOW (ref 12.0–16.0)
MCH: 27.2 pg (ref 26.0–34.0)
MCHC: 33 g/dL (ref 32.0–36.0)
MCV: 82.4 fL (ref 80.0–100.0)
PLATELETS: 238 10*3/uL (ref 150–440)
RBC: 4.38 MIL/uL (ref 3.80–5.20)
RDW: 17 % — AB (ref 11.5–14.5)
WBC: 6.4 10*3/uL (ref 3.6–11.0)

## 2016-09-25 LAB — GLUCOSE, CAPILLARY
GLUCOSE-CAPILLARY: 138 mg/dL — AB (ref 65–99)
Glucose-Capillary: 175 mg/dL — ABNORMAL HIGH (ref 65–99)
Glucose-Capillary: 125 mg/dL — ABNORMAL HIGH (ref 65–99)
Glucose-Capillary: 184 mg/dL — ABNORMAL HIGH (ref 65–99)

## 2016-09-25 LAB — AMMONIA: Ammonia: 21 umol/L (ref 9–35)

## 2016-09-25 LAB — BASIC METABOLIC PANEL
Anion gap: 6 (ref 5–15)
BUN: 43 mg/dL — ABNORMAL HIGH (ref 6–20)
CALCIUM: 10 mg/dL (ref 8.9–10.3)
CO2: 35 mmol/L — AB (ref 22–32)
CREATININE: 1.32 mg/dL — AB (ref 0.44–1.00)
Chloride: 98 mmol/L — ABNORMAL LOW (ref 101–111)
GFR calc non Af Amer: 39 mL/min — ABNORMAL LOW (ref >60)
GFR, EST AFRICAN AMERICAN: 45 mL/min — AB (ref >60)
Glucose, Bld: 222 mg/dL — ABNORMAL HIGH (ref 65–99)
Potassium: 4.7 mmol/L (ref 3.5–5.1)
SODIUM: 139 mmol/L (ref 135–145)

## 2016-09-25 LAB — HEMOGLOBIN
HEMOGLOBIN: 11.5 g/dL — AB (ref 12.0–16.0)
Hemoglobin: 11.3 g/dL — ABNORMAL LOW (ref 12.0–16.0)

## 2016-09-25 MED ORDER — ALUM & MAG HYDROXIDE-SIMETH 200-200-20 MG/5ML PO SUSP
30.0000 mL | ORAL | Status: DC | PRN
  Administered 2016-09-25: 30 mL via ORAL
  Filled 2016-09-25: qty 30

## 2016-09-25 MED ORDER — IOPAMIDOL (ISOVUE-300) INJECTION 61%
15.0000 mL | INTRAVENOUS | Status: AC
  Administered 2016-09-25: 15 mL via ORAL

## 2016-09-25 MED ORDER — IOPAMIDOL (ISOVUE-300) INJECTION 61%
30.0000 mL | Freq: Once | INTRAVENOUS | Status: AC | PRN
  Administered 2016-09-25: 30 mL via ORAL

## 2016-09-25 NOTE — Progress Notes (Signed)
Per Dr. Ether Griffins okay to discontinue CT of abdomen

## 2016-09-25 NOTE — Progress Notes (Signed)
Pt now in agreeance to take medications she refused this morning and to do CT scan and drink contrast

## 2016-09-25 NOTE — Progress Notes (Signed)
PT Cancellation Note  Patient Details Name: Carolyn Valdez MRN: [id number] DOB: 11/12/1940   Cancelled Treatment:    Reason Eval/Treat Not Completed: Patient's level of consciousness;Other (comment). Chart reviewed, RN consulted. PT evaluation attempted, pt still with moderate AMS, poor ability to attend to task, follow single step instructions, and with disorientation. Pt encouraged to participate in simple bed mobility, which she is agreeable to, but is not following instructions after attempt for 3 minutes. Will attempt PT evaluation again at later date and time.   11:05 AM, 09/25/16 Etta Grandchild, PT, DPT Physical Therapist - [place] [phone] (mobile)

## 2016-09-25 NOTE — Progress Notes (Signed)
Pt BP 121/44. MD notified, metoprolol held per verbal order.

## 2016-09-25 NOTE — Progress Notes (Signed)
[facility] NAME: Carolyn Valdez    MR#:  [id number]  DATE OF BIRTH:  September 17, 1941  SUBJECTIVE:  CHIEF COMPLAINT:   Chief Complaint  Patient presents with  . Dysuria   The patient is a 75 year old female with past medical history significant for history of multiple medical problems including diastolic CHF, arthritis, bilateral lower extremity edema, venous insufficiency, recurrent UTIs, hypertension, diabetes, who presents to the hospital due to altered mental status, confusion, urinary retention. Apparently, the patient had about 1 L of urine in the bladder, Foley catheter was placed and patient was admitted, urinalysis was remarkable for pyuria. Patient was initiated on vancomycin intravenously. The patient is too sleepy today to provide review of systems Review of Systems  Unable to perform ROS: Mental acuity    VITAL SIGNS: Blood pressure 125/69, pulse 67, temperature 98 F (36.7 C), temperature source Oral, resp. rate 19, height 5\' 8"  (1.727 m), weight 131.5 kg (290 lb), SpO2 96 %.  PHYSICAL EXAMINATION:   GENERAL:  75 y.o.-year-old patient lying in the bed with no acute distress. Very somnolent, opens her eyes and converses briefly done through the back to sleep EYES: Pupils equal, round, reactive to light and accommodation. No scleral icterus. Extraocular muscles intact.  HEENT: Head atraumatic, normocephalic. Oropharynx and nasopharynx clear.  NECK:  Supple, no jugular venous distention. No thyroid enlargement, no tenderness.  LUNGS: Normal breath sounds bilaterally, no wheezing, rales,rhonchi or crepitation. No use of accessory muscles of respiration.  CARDIOVASCULAR: S1, S2 normal. No murmurs, rubs, or gallops.  ABDOMEN: Soft, mildly distended tender diffusely but no rebound or guarding was noted, just uncomfortable all over, mostly on the right side, nondistended. Bowel sounds present. No organomegaly or mass.  EXTREMITIES:  No pedal edema, cyanosis, or clubbing. Right lower extremity erythema in the anterior shin area NEUROLOGIC: Cranial nerves II through XII are intact. Muscle strength 5/5 in all extremities. Sensation intact. Gait not checked.  PSYCHIATRIC: The patient is alert and oriented x 3. Pain palpating muscles all over SKIN: No obvious rash, lesion, or ulcer.  Skin is scaly and thin with erythema, most pronounced in the right anterior shin ORDERS/RESULTS REVIEWED:   CBC  Recent Labs Lab 09/24/16 1215 09/24/16 1835 09/25/16 0140 09/25/16 0940  WBC 7.8  --  6.4  --   HGB 12.8 12.2 11.9* 11.5*  HCT 40.0  --  36.1  --   PLT 240  --  238  --   MCV 84.7  --  82.4  --   MCH 27.2  --  27.2  --   MCHC 32.1  --  33.0  --   RDW 17.2*  --  17.0*  --    ------------------------------------------------------------------------------------------------------------------  Chemistries   Recent Labs Lab 09/24/16 1215 09/25/16 0140  NA 139 139  K 4.7 4.7  CL 97* 98*  CO2 38* 35*  GLUCOSE 292* 222*  BUN 44* 43*  CREATININE 1.41* 1.32*  CALCIUM 10.5* 10.0  AST 19  --   ALT 10*  --   ALKPHOS 63  --   BILITOT 0.6  --    ------------------------------------------------------------------------------------------------------------------ estimated creatinine clearance is 54.5 mL/min (by C-G formula based on SCr of 1.32 mg/dL (H)). ------------------------------------------------------------------------------------------------------------------ No results for input(s): TSH, T4TOTAL, T3FREE, THYROIDAB in the last 72 hours.  Invalid input(s): FREET3  Cardiac Enzymes No results for input(s): CKMB, TROPONINI, MYOGLOBIN in the last 168 hours.  Invalid input(s): CK ------------------------------------------------------------------------------------------------------------------ Invalid input(s):  POCBNP ---------------------------------------------------------------------------------------------------------------  RADIOLOGY: Ct Head Wo Contrast  Result Date: 09/24/2016 CLINICAL DATA:  Recent UTI. Weakness and confusion. History of breast cancer. EXAM: CT HEAD WITHOUT CONTRAST TECHNIQUE: Contiguous axial images were obtained from the base of the skull through the vertex without intravenous contrast. COMPARISON:  None. FINDINGS: Brain: No subdural, epidural, or subarachnoid hemorrhage. Cerebellum, brainstem, and basal cisterns are normal. Ventricles and sulci are mildly prominent but within normal limits for age. Mild white matter changes. No acute cortical ischemia or infarct. No mass, mass effect, or midline shift. Vascular: Calcified atherosclerosis in the intracranial carotid arteries. Skull: Normal. Negative for fracture or focal lesion. Sinuses/Orbits: No acute finding. Other: None. IMPRESSION: 1. No acute intracranial process. Electronically Signed   By: Dorise Bullion III M.D   On: 09/24/2016 13:57    EKG:  Orders placed or performed during the hospital encounter of 02/23/16  . ED EKG  . ED EKG  . EKG    ASSESSMENT AND PLAN:  Active Problems:   Altered mental status #1 acute encephalopathy, questionable due to urinary tract infection/right lower extremity cellulitis/?intra-abdominal infection, supportive therapy, IV fluids, nothing by mouth. Ammonia level is normal #2. Diffuse abdominal pain of unclear etiology, get CT scan of abdomen, continue broad-spectrum antibiotic therapy, nothing by mouth for now #3. Pyuria, likely recurrent urinary tract infection with urinary retention, urinary cultures are pending, 1 blood cultures negative so far, pending #4. Renal insufficiency, chronic, CK D stage III, follow closely #5. Anemia. With rehydration, no active bleeding, follow in the morning #6. Acute urinary retention, status post Foley catheter placement, possibly related to  recurrence of infection, continue Foley catheter for now, discontinue it was voiding trial prior to discharge Management plans discussed with the patient, family and they are in agreement.   DRUG ALLERGIES:  Allergies  Allergen Reactions  . Sulfa Antibiotics Hives  . Biaxin [Clarithromycin] Hives  . Eggs Or Egg-Derived Products Other (See Comments)    Pt states that her allergy test said she was allergic to eggs.    . Influenza A (H1n1) Monoval Vac Other (See Comments)    Pt states that she was told by her MD not to get the influenza vaccine.    . Morphine Other (See Comments)    Reaction:  Dizziness and confusion   . Pyridium [Phenazopyridine Hcl] Other (See Comments)    Reaction:  Unknown   . Ceftriaxone Anxiety  . Latex Rash  . Prednisone Rash  . Tape Rash    CODE STATUS:     Code Status Orders        Start     Ordered   09/24/16 1731  Full code  Continuous     09/24/16 1730    Code Status History    Date Active Date Inactive Code Status Order ID Comments User Context   09/01/2016  2:17 AM 09/05/2016  4:28 PM Full Code [id number]  Harvie Bridge, DO Inpatient   08/09/2016  8:27 PM 08/12/2016  8:15 PM Full Code [id number]  Henreitta Leber, MD Inpatient   09/30/2015  2:23 PM 10/07/2015  5:37 PM Full Code [id number]  Loletha Grayer, MD ED   09/05/2015  5:56 PM 09/09/2015  5:38 PM DNR [id number]  Loletha Grayer, MD ED    Advance Directive Documentation   [place] Most Recent Value  Type of Advance Directive  Living will  Pre-existing out of facility DNR order (yellow form or pink MOST form)  No data  "MOST" Form in Place?  No data      TOTAL TIME TAKING CARE OF THIS PATIENT: 40 minutes.    Theodoro Grist M.D on 09/25/2016 at 1:30 PM  Between 7am to 6pm - Pager - [phone]  After 6pm go to [email or web address] - password [email or web address]  [place] [place] [facility]  Office  [phone]  CC: Primary care physician; Leonel Ramsay, MD

## 2016-09-25 NOTE — Clinical Social Work Note (Signed)
CSW received consult for patient living alone with little support. According to the ED note from Gwynn Burly, 09/24/2016,. 11:57am, "Patient lives at home with caregiver (sister)." According to the H+P, the sister is aware of symptoms and is in contact with the attending.  CSW signing off, but available if status changes.  Santiago Bumpers, MSW, LCSW-A [phone]

## 2016-09-26 LAB — URINE CULTURE: CULTURE: NO GROWTH

## 2016-09-26 LAB — BLOOD GAS, ARTERIAL
Acid-Base Excess: 10.5 mmol/L — ABNORMAL HIGH (ref 0.0–2.0)
Bicarbonate: 36.1 mmol/L — ABNORMAL HIGH (ref 20.0–28.0)
FIO2: 0.28
O2 SAT: 94.6 %
PCO2 ART: 52 mmHg — AB (ref 32.0–48.0)
PO2 ART: 70 mmHg — AB (ref 83.0–108.0)
Patient temperature: 37
pH, Arterial: 7.45 (ref 7.350–7.450)

## 2016-09-26 LAB — GLUCOSE, CAPILLARY
GLUCOSE-CAPILLARY: 178 mg/dL — AB (ref 65–99)
GLUCOSE-CAPILLARY: 267 mg/dL — AB (ref 65–99)
Glucose-Capillary: 86 mg/dL (ref 65–99)
Glucose-Capillary: 183 mg/dL — ABNORMAL HIGH (ref 65–99)

## 2016-09-26 MED ORDER — POLYETHYLENE GLYCOL 3350 17 G PO PACK
17.0000 g | PACK | Freq: Every day | ORAL | Status: DC
Start: 2016-09-26 — End: 2016-09-26
  Administered 2016-09-26: 17 g via ORAL
  Filled 2016-09-26: qty 1

## 2016-09-26 MED ORDER — POLYETHYLENE GLYCOL 3350 17 G PO PACK
17.0000 g | PACK | Freq: Three times a day (TID) | ORAL | Status: AC
  Administered 2016-09-26 – 2016-09-27 (×4): 17 g via ORAL
  Filled 2016-09-26 (×5): qty 1

## 2016-09-26 NOTE — Consult Note (Signed)
Referring Provider: Dr. Ether Griffins Primary Care Physician:  Leonel Ramsay, MD   Reason for Consultation:  Proctitis  HPI: Carolyn Valdez is a 75 y.o. female admitted for altered mental status who has multiple medical problems being seen for a consult due to concern for proctitis seen on CT scan. She has been having constipation at times and loose stools at other times. Having N/V prior to admit. Complaining of diffuse abdominal pain. Also, reports rectal pain. No history of melena or hematochezia prior to admit but had a black stool yesterday on iron pills per nursing. Heme positive dark stool per ER note. Reports having a colonoscopy 4 years ago and has a personal history of colon polyps. Sister and niece at bedside.  Past Medical History:  Diagnosis Date  . Anxiety   . Breast cancer ([place])    16 yrs ago  . CHF (congestive heart failure) ([place])   . Diabetes mellitus without complication ([place])   . DJD (degenerative joint disease)   . Dysuria   . H/O total knee replacement    right  . HTN (hypertension)   . Hypercholesteremia   . Kidney stone   . Obesity   . Recurrent urinary tract infection   . SVT (supraventricular tachycardia) ([place])   . Urinary incontinence   . Vaginal atrophy   . Yeast vaginitis     Past Surgical History:  Procedure Laterality Date  . CARDIAC CATHETERIZATION    . CHOLECYSTECTOMY    . FOOT SURGERY    . JOINT REPLACEMENT    . MASTECTOMY Right 1997  . NASAL SEPTUM SURGERY    . PERIPHERAL VASCULAR CATHETERIZATION N/A 07/08/2015   Procedure: PICC Line Insertion;  Surgeon: Algernon Huxley, MD;  Location: [facility];  Service: Cardiovascular;  Laterality: N/A;  . TONSILLECTOMY    . Vocal cords      Prior to Admission medications   Medication Sig Start Date End Date Taking? Authorizing Provider  amLODipine (NORVASC) 2.5 MG tablet Take 1 tablet (2.5 mg total) by mouth daily. 09/05/16  Yes Epifanio Lesches, MD  atorvastatin (LIPITOR) 20 MG tablet Take  20 mg by mouth at bedtime.   Yes Historical Provider, MD  clonazePAM (KLONOPIN) 1 MG tablet Take 1 mg by mouth 3 (three) times daily.   Yes Historical Provider, MD  conjugated estrogens (PREMARIN) vaginal cream Place 1 Applicatorful vaginally 3 (three) times a week. Pt uses on Monday, Wednesday, and Friday.   Yes Historical Provider, MD  desmopressin (DDAVP) 0.2 MG tablet Take 1 tablet (0.2 mg total) by mouth at bedtime. 11/05/15  Yes Shannon A McGowan, PA-C  docusate sodium (COLACE) 100 MG capsule Take 200 mg by mouth 2 (two) times daily.   Yes Historical Provider, MD  fexofenadine (ALLEGRA) 180 MG tablet Take 180 mg by mouth daily.   Yes Historical Provider, MD  furosemide (LASIX) 20 MG tablet Take 1 tablet (20 mg total) by mouth daily. 09/05/16  Yes Epifanio Lesches, MD  gabapentin (NEURONTIN) 300 MG capsule Take 300-600 mg by mouth 3 (three) times daily. Pt takes two capsules in the morning, one capsule at lunch, and two capsules in the evening.   Yes Historical Provider, MD  hydrOXYzine (ATARAX/VISTARIL) 25 MG tablet Take 25 mg by mouth at bedtime.    Yes Historical Provider, MD  insulin aspart (NOVOLOG) 100 UNIT/ML injection Inject 0-20 Units into the skin 3 (three) times daily with meals. 09/05/16  Yes Epifanio Lesches, MD  insulin aspart (NOVOLOG) 100  UNIT/ML injection Inject 0-5 Units into the skin at bedtime. 09/05/16  Yes Epifanio Lesches, MD  insulin glargine (LANTUS) 100 UNIT/ML injection Inject 50 Units into the skin at bedtime.   Yes Historical Provider, MD  iron polysaccharides (NIFEREX) 150 MG capsule Take 150 mg by mouth 3 (three) times daily.    Yes Historical Provider, MD  metoprolol succinate (TOPROL-XL) 25 MG 24 hr tablet Take 1 tablet (25 mg total) by mouth 2 (two) times daily. 09/05/16  Yes Epifanio Lesches, MD  nystatin (MYCOSTATIN/NYSTOP) powder Apply topically 3 (three) times daily. 09/05/16  Yes Epifanio Lesches, MD  omeprazole (PRILOSEC) 40 MG capsule Take 40  mg by mouth 2 (two) times daily.   Yes Historical Provider, MD  ondansetron (ZOFRAN ODT) 4 MG disintegrating tablet Take 1 tablet (4 mg total) by mouth every 8 (eight) hours as needed for nausea or vomiting. 09/11/16  Yes Harvest Dark, MD  pioglitazone (ACTOS) 30 MG tablet Take 30 mg by mouth daily.   Yes Historical Provider, MD  potassium chloride SA (K-DUR,KLOR-CON) 20 MEQ tablet Take 1 tablet (20 mEq total) by mouth daily. 08/13/16  Yes Lytle Butte, MD  VESICARE 10 MG tablet Take 1 tablet (10 mg total) by mouth daily. 11/05/15  Yes Shannon A McGowan, PA-C  vitamin E 1000 UNIT capsule Take 1,000 Units by mouth daily.   Yes Historical Provider, MD    Scheduled Meds: . amLODipine  2.5 mg Oral Daily  . atorvastatin  20 mg Oral QHS  . conjugated estrogens  1 Applicatorful Vaginal Once per day on Mon Wed Fri  . desmopressin  0.2 mg Oral QHS  . enoxaparin (LOVENOX) injection  40 mg Subcutaneous Q12H  . insulin aspart  0-5 Units Subcutaneous QHS  . insulin aspart  0-9 Units Subcutaneous TID WC  . insulin glargine  50 Units Subcutaneous QHS  . iron polysaccharides  150 mg Oral TID  . loratadine  10 mg Oral Daily  . meropenem  1 g Intravenous Q8H  . metoprolol succinate  25 mg Oral BID  . nystatin   Topical TID  . pantoprazole  40 mg Oral BID  . polyethylene glycol  17 g Oral Daily  . potassium chloride SA  20 mEq Oral Daily  . vancomycin  1,250 mg Intravenous Q18H  . vitamin E  1,000 Units Oral Daily   Continuous Infusions: PRN Meds:.acetaminophen **OR** acetaminophen, alum & mag hydroxide-simeth, ondansetron **OR** ondansetron (ZOFRAN) IV  Allergies as of 09/24/2016 - Review Complete 09/24/2016  Allergen Reaction Noted  . Sulfa antibiotics Hives 03/13/2015  . Biaxin [clarithromycin] Hives 04/17/2015  . Eggs or egg-derived products Other (See Comments) 04/17/2015  . Influenza a (h1n1) monoval vac Other (See Comments) 05/07/2015  . Morphine Other (See Comments) 03/13/2015  .  Pyridium [phenazopyridine hcl] Other (See Comments) 04/17/2015  . Ceftriaxone Anxiety 03/13/2015  . Latex Rash 03/13/2015  . Prednisone Rash 03/13/2015  . Tape Rash 03/13/2015    Family History  Problem Relation Age of Onset  . Lung cancer Father   . Hematuria Mother   . Lung cancer Mother   . Kidney disease Neg Hx   . Bladder Cancer Neg Hx   . Breast cancer Neg Hx     Social History   Social History  . Marital status: Widowed    Spouse name: N/A  . Number of children: N/A  . Years of education: N/A   Occupational History  . Not on file.   Social History Main Topics  .  Smoking status: Former Research scientist (life sciences)  . Smokeless tobacco: Former Systems developer    Quit date: 10/29/1995  . Alcohol use No  . Drug use: No  . Sexual activity: Not on file   Other Topics Concern  . Not on file   Social History Narrative  . No narrative on file    Review of Systems: All negative except as stated above in HPI.  Physical Exam: Vital signs: Vitals:   09/26/16 0940 09/26/16 0942  BP: (!) 138/35 (!) 143/41  Pulse: 75 71  Resp:    Temp:     T 98.4  Last BM Date: 09/25/16 General:   Elderly, obese, pleasant and cooperative in NAD HEENT: anicteric sclera Lungs:  Clear throughout to auscultation.   No wheezes, crackles, or rhonchi. No acute distress. Heart:  Regular rate and rhythm; no murmurs, clicks, rubs,  or gallops. Abdomen: diffuse tenderness with guarding, soft, nondistended, +BS  Rectal:  Deferred Ext: chronic scaly skin changes with cracking and dry skin noted on LE  GI:  Lab Results:  Recent Labs  09/24/16 1215  09/25/16 0140 09/25/16 0940 09/25/16 1719  WBC 7.8  --  6.4  --   --   HGB 12.8  < > 11.9* 11.5* 11.3*  HCT 40.0  --  36.1  --   --   PLT 240  --  238  --   --   < > = values in this interval not displayed. BMET  Recent Labs  09/24/16 1215 09/25/16 0140  NA 139 139  K 4.7 4.7  CL 97* 98*  CO2 38* 35*  GLUCOSE 292* 222*  BUN 44* 43*  CREATININE 1.41* 1.32*   CALCIUM 10.5* 10.0   LFT  Recent Labs  09/24/16 1215  PROT 7.6  ALBUMIN 3.5  AST 19  ALT 10*  ALKPHOS 63  BILITOT 0.6   PT/INR No results for input(s): LABPROT, INR in the last 72 hours.   Studies/Results: Ct Abdomen Pelvis Wo Contrast  Result Date: 09/25/2016 CLINICAL DATA:  Diffuse abdominal pain. EXAM: CT ABDOMEN AND PELVIS WITHOUT CONTRAST TECHNIQUE: Multidetector CT imaging of the abdomen and pelvis was performed following the standard protocol without IV contrast. COMPARISON:  September 06, 2014 FINDINGS: Lower chest: Mild dependent atelectasis is identified. No suspicious nodule, mass, or infiltrate. Cardiomegaly is identified. There are coronary artery calcifications. The patient is status post right mastectomy. Hepatobiliary: The patient is status post cholecystectomy. The liver is unremarkable. Pancreas: Unremarkable. No pancreatic ductal dilatation or surrounding inflammatory changes. Spleen: Normal in size without focal abnormality. Adrenals/Urinary Tract: The adrenal glands are normal. Mild bilateral perinephric stranding is likely nonacute. No hydronephrosis is seen bilaterally. Calcification posteriorly in the mid right kidney measuring 17 mm is consistent with stones within a midpole calyx. No other stones. No ureterectasis or ureteral stones. The bladder is decompressed with a [place]oley catheter. Stomach/Bowel: The stomach and small bowel are unremarkable. No bowel obstruction identified. Prominence of the rectal wall. Recommend clinical correlation. A few scattered colonic diverticuli are seen without diverticulitis. The remainder of the colon is normal. The appendix is normal. Vascular/Lymphatic: Atherosclerotic change throughout the aorta, iliac vessels common femoral vessels. No adenopathy. Reproductive: Uterus and bilateral adnexa are unremarkable. Other: Increased attenuation of fat posteriorly in the pelvis is in a dependent location. This could be associated with the  suggested rectal wall thickening. Musculoskeletal: Mild scalloping of the superior endplate of L5 is new since 2015 but there is no adjacent stranding to suggest that it is  acute. IMPRESSION: 1. Prominence of the rectal wall with increased attenuation in the fat of the pelvis. Findings raise the possibility of proctitis. Recommend clinical correlation. 2. 17 mm stone posteriorly in the right kidney without obstruction. 3. Scalloping of the superior endplate of L5, not seen in 2015. No adjacent soft tissue swelling. Recommend clinical correlation to exclude point tenderness. Electronically Signed   By: Dorise Bullion III M.D   On: 09/25/2016 19:03   Ct Head Wo Contrast  Result Date: 09/24/2016 CLINICAL DATA:  Recent UTI. Weakness and confusion. History of breast cancer. EXAM: CT HEAD WITHOUT CONTRAST TECHNIQUE: Contiguous axial images were obtained from the base of the skull through the vertex without intravenous contrast. COMPARISON:  None. FINDINGS: Brain: No subdural, epidural, or subarachnoid hemorrhage. Cerebellum, brainstem, and basal cisterns are normal. Ventricles and sulci are mildly prominent but within normal limits for age. Mild white matter changes. No acute cortical ischemia or infarct. No mass, mass effect, or midline shift. Vascular: Calcified atherosclerosis in the intracranial carotid arteries. Skull: Normal. Negative for fracture or focal lesion. Sinuses/Orbits: No acute finding. Other: None. IMPRESSION: 1. No acute intracranial process. Electronically Signed   By: Dorise Bullion III M.D   On: 09/24/2016 13:57    Impression/Plan: 75 yo with recent constipation and thickening of the rectal wall on the CT scan likely due to stercoral ulcer causing rectal pain and inflammation. Needs aggressive bowel regimen with Miralax TID for 2 days. Flex sig not needed at this time. I think the CT findings in the rectum are due to the constipation and would managed conservatively.     LOS: 2 days    [place] C.  09/26/2016, 1:07 PM

## 2016-09-26 NOTE — Progress Notes (Signed)
Per Dr. Ether Griffins do not give amlodepine or metoprolol for low BP. Will continue to monitor

## 2016-09-26 NOTE — Progress Notes (Signed)
PT Cancellation Note  Patient Details Name: Carolyn Valdez MRN: [id number] DOB: 1941-07-10   Cancelled Treatment:    Reason Eval/Treat Not Completed: Other (comment);Patient's level of consciousness. Chart reviewed, RN consulted. Pt engaged for 15 minutes at bedside. Pt is mildly less drowsy today, but continues to demonstrate some AMS, not convinced she is at [facility], reports she had a fall during a CT last night at [facility]. The patient gives many reasons why she cannon participate with PT, then after agreeable to attempt bed mobility, begins to scream regarding some pain in her LLE, she point to the posterolateral trochanteric area. PROM via changes to bed back position and leg position also cause screaming pain, patient eventually crying. She reports 20/10 pain initially, then revises this to a 10/10 when PT explains how the NPRS works. Will attempt again at later date/time.   Received on 2L/min, trialed on RA c SaO2: 90%, left on 1L/min at exit.     10:49 AM, 09/26/16 Etta Grandchild, PT, DPT Physical Therapist - [place] [phone] (mobile)

## 2016-09-26 NOTE — Progress Notes (Signed)
[facility] [place] [place] NAME: Carolyn Valdez    MR#:  [id number]  DATE OF BIRTH:  August 01, 1941  SUBJECTIVE:  CHIEF COMPLAINT:   Chief Complaint  Patient presents with  . Dysuria   The patient is a 75 year old female with past medical history significant for history of multiple medical problems including diastolic CHF, arthritis, bilateral lower extremity edema, venous insufficiency, recurrent UTIs, hypertension, diabetes, who presents to the hospital due to altered mental status, confusion, urinary retention. Apparently, the patient had about 1 L of urine in the bladder, [place]oley catheter was placed and patient was admitted, urinalysis was remarkable for pyuria. Patient was initiated on vancomycin and meropenem intravenously. She was complaining of abdominal pain yesterday, underwent CT of abdomen, revealing proctitis, concerning for possible severe constipation related proctitis, per her gastroenterologist today. Patient feels more comfortable today, still complains of abdominal pain, especially on palpation, but she is much more alert and comfortable    Review of Systems  Gastrointestinal: Positive for abdominal pain and constipation.    VITAL SIGNS: Blood pressure (!) 139/45, pulse 73, temperature 98 F (36.7 C), temperature source Oral, resp. rate 19, height 5\' 8"  (1.727 m), weight 131.5 kg (290 lb), SpO2 96 %.  PHYSICAL EXAMINATION:   GENERAL:  75 y.o.-year-old patient lying in the bed with no acute distress. More alert and comfortable, although starts crying, screaming whenever I approach her and extend my hand to touch her abdomen, not even touching her abdominal wall  EYES: Pupils equal, round, reactive to light and accommodation. No scleral icterus. Extraocular muscles intact.  HEENT: Head atraumatic, normocephalic. Oropharynx and nasopharynx clear.  NECK:  Supple, no jugular venous distention. No thyroid enlargement, no tenderness.  LUNGS:  Normal breath sounds bilaterally, no wheezing, rales,rhonchi or crepitation. No use of accessory muscles of respiration.  CARDIOVASCULAR: S1, S2 normal. No murmurs, rubs, or gallops.  ABDOMEN: Soft, mildly  tender diffusely but no rebound or guarding was noted, nondistended. Bowel sounds present. No organomegaly or mass.  EXTREMITIES: No pedal edema, cyanosis, or clubbing. Right lower extremity erythema in the anterior shin area NEUROLOGIC: Cranial nerves II through XII are intact. Muscle strength 5/5 in all extremities. Sensation intact. Gait not checked.  PSYCHIATRIC: The patient is alert , very anxious SKIN: No obvious rash, lesion, or ulcer.  Skin is scaly and thin with erythema, most pronounced in the right anterior shin ORDERS/RESULTS REVIEWED:   CBC  Recent Labs Lab 09/24/16 1215 09/24/16 1835 09/25/16 0140 09/25/16 0940 09/25/16 1719  WBC 7.8  --  6.4  --   --   HGB 12.8 12.2 11.9* 11.5* 11.3*  HCT 40.0  --  36.1  --   --   PLT 240  --  238  --   --   MCV 84.7  --  82.4  --   --   MCH 27.2  --  27.2  --   --   MCHC 32.1  --  33.0  --   --   RDW 17.2*  --  17.0*  --   --    ------------------------------------------------------------------------------------------------------------------  Chemistries   Recent Labs Lab 09/24/16 1215 09/25/16 0140  NA 139 139  K 4.7 4.7  CL 97* 98*  CO2 38* 35*  GLUCOSE 292* 222*  BUN 44* 43*  CREATININE 1.41* 1.32*  CALCIUM 10.5* 10.0  AST 19  --   ALT 10*  --   ALKPHOS 63  --   BILITOT 0.6  --    ------------------------------------------------------------------------------------------------------------------  estimated creatinine clearance is 54.5 mL/min (by C-G formula based on SCr of 1.32 mg/dL (H)). ------------------------------------------------------------------------------------------------------------------ No results for input(s): TSH, T4TOTAL, T3FREE, THYROIDAB in the last 72 hours.  Invalid input(s):  FREET3  Cardiac Enzymes No results for input(s): CKMB, TROPONINI, MYOGLOBIN in the last 168 hours.  Invalid input(s): CK ------------------------------------------------------------------------------------------------------------------ Invalid input(s): POCBNP ---------------------------------------------------------------------------------------------------------------  RADIOLOGY: Ct Abdomen Pelvis Wo Contrast  Result Date: 09/25/2016 CLINICAL DATA:  Diffuse abdominal pain. EXAM: CT ABDOMEN AND PELVIS WITHOUT CONTRAST TECHNIQUE: Multidetector CT imaging of the abdomen and pelvis was performed following the standard protocol without IV contrast. COMPARISON:  September 06, 2014 FINDINGS: Lower chest: Mild dependent atelectasis is identified. No suspicious nodule, mass, or infiltrate. Cardiomegaly is identified. There are coronary artery calcifications. The patient is status post right mastectomy. Hepatobiliary: The patient is status post cholecystectomy. The liver is unremarkable. Pancreas: Unremarkable. No pancreatic ductal dilatation or surrounding inflammatory changes. Spleen: Normal in size without focal abnormality. Adrenals/Urinary Tract: The adrenal glands are normal. Mild bilateral perinephric stranding is likely nonacute. No hydronephrosis is seen bilaterally. Calcification posteriorly in the mid right kidney measuring 17 mm is consistent with stones within a midpole calyx. No other stones. No ureterectasis or ureteral stones. The bladder is decompressed with a Foley catheter. Stomach/Bowel: The stomach and small bowel are unremarkable. No bowel obstruction identified. Prominence of the rectal wall. Recommend clinical correlation. A few scattered colonic diverticuli are seen without diverticulitis. The remainder of the colon is normal. The appendix is normal. Vascular/Lymphatic: Atherosclerotic change throughout the aorta, iliac vessels common femoral vessels. No adenopathy. Reproductive: Uterus  and bilateral adnexa are unremarkable. Other: Increased attenuation of fat posteriorly in the pelvis is in a dependent location. This could be associated with the suggested rectal wall thickening. Musculoskeletal: Mild scalloping of the superior endplate of L5 is new since 2015 but there is no adjacent stranding to suggest that it is acute. IMPRESSION: 1. Prominence of the rectal wall with increased attenuation in the fat of the pelvis. Findings raise the possibility of proctitis. Recommend clinical correlation. 2. 17 mm stone posteriorly in the right kidney without obstruction. 3. Scalloping of the superior endplate of L5, not seen in 2015. No adjacent soft tissue swelling. Recommend clinical correlation to exclude point tenderness. Electronically Signed   By: Dorise Bullion III M.D   On: 09/25/2016 19:03    EKG:  Orders placed or performed during the hospital encounter of 02/23/16  . ED EKG  . ED EKG  . EKG    ASSESSMENT AND PLAN:  Active Problems:   Altered mental status #1 acute encephalopathy due to right lower extremity cellulitis, stercoral proctitis, improved on supportive therapy, IV fluids, antibiotic therapy, follow closely. Ammonia level was normal #2. Diffuse abdominal pain , suspected constipation, CT scan of abdomen and pelvis revealed proctitis, suspected stercoral due to constipation, per gastroenterologist, continue meropenem, regular diet #3. Sterile Pyuria, initially suspected recurrent urinary tract infection with urinary retention, urinary culture is negative, 1 blood culture negative so far, pending, urinary retention was likely due to severe constipation #4. Renal insufficiency, chronic, CK D stage III, follow closely, recheck in the morning #5. Anemia. With rehydration, no active bleeding, follow in the morning #6. Acute urinary retention, status post Foley catheter placement, related to constipation, continue Foley catheter for now, discontinue prior to discharge #7,  constipation, initiate MiraLAX, follow stool output.  #8 proctitis, suspected stercoral, initiate MiraLAX to relieve constipation, follow clinically, patient was seen by gastroenterologist, who did not recommend flexible sigmoidoscopy  at this time, but conservative management  Management plans discussed with the patient, family and they are in agreement.   DRUG ALLERGIES:  Allergies  Allergen Reactions  . Sulfa Antibiotics Hives  . Biaxin [Clarithromycin] Hives  . Eggs Or Egg-Derived Products Other (See Comments)    Pt states that her allergy test said she was allergic to eggs.    . Influenza A (H1n1) Monoval Vac Other (See Comments)    Pt states that she was told by her MD not to get the influenza vaccine.    . Morphine Other (See Comments)    Reaction:  Dizziness and confusion   . Pyridium [Phenazopyridine Hcl] Other (See Comments)    Reaction:  Unknown   . Ceftriaxone Anxiety  . Latex Rash  . Prednisone Rash  . Tape Rash    CODE STATUS:     Code Status Orders        Start     Ordered   09/24/16 1731  Full code  Continuous     09/24/16 1730    Code Status History    Date Active Date Inactive Code Status Order ID Comments User Context   09/01/2016  2:17 AM 09/05/2016  4:28 PM Full Code [id number]  Harvie Bridge, DO Inpatient   08/09/2016  8:27 PM 08/12/2016  8:15 PM Full Code [id number]  Henreitta Leber, MD Inpatient   09/30/2015  2:23 PM 10/07/2015  5:37 PM Full Code [id number]  Loletha Grayer, MD ED   09/05/2015  5:56 PM 09/09/2015  5:38 PM DNR [id number]  Loletha Grayer, MD ED    Advance Directive Documentation   [place] Most Recent Value  Type of Advance Directive  Living will  Pre-existing out of facility DNR order (yellow form or pink MOST form)  No data  "MOST" Form in Place?  No data      TOTAL TIME TAKING CARE OF THIS PATIENT: 35 minutes.   His gases patient's sister, all questions were answered, voiced understanding Silver Parkey M.D on  09/26/2016 at 3:26 PM  Between 7am to 6pm - Pager - [phone]  After 6pm go to [email or web address] - password [email or web address] [facility]  [place] [facility]  Office  [phone]  CC: Primary care physician; Leonel Ramsay, MD

## 2016-09-27 DIAGNOSIS — K922 Gastrointestinal hemorrhage, unspecified: Secondary | ICD-10-CM

## 2016-09-27 DIAGNOSIS — R1084 Generalized abdominal pain: Secondary | ICD-10-CM

## 2016-09-27 LAB — GLUCOSE, CAPILLARY
GLUCOSE-CAPILLARY: 173 mg/dL — AB (ref 65–99)
GLUCOSE-CAPILLARY: 169 mg/dL — AB (ref 65–99)
GLUCOSE-CAPILLARY: 171 mg/dL — AB (ref 65–99)
Glucose-Capillary: 127 mg/dL — ABNORMAL HIGH (ref 65–99)
Glucose-Capillary: 166 mg/dL — ABNORMAL HIGH (ref 65–99)
Glucose-Capillary: 179 mg/dL — ABNORMAL HIGH (ref 65–99)

## 2016-09-27 LAB — CREATININE, SERUM
Creatinine, Ser: 1.21 mg/dL — ABNORMAL HIGH (ref 0.44–1.00)
GFR calc Af Amer: 50 mL/min — ABNORMAL LOW (ref >60)
GFR, EST NON AFRICAN AMERICAN: 43 mL/min — AB (ref >60)

## 2016-09-27 LAB — VANCOMYCIN, TROUGH: VANCOMYCIN TR: 31 ug/mL — AB (ref 15–20)

## 2016-09-27 MED ORDER — AMOXICILLIN-POT CLAVULANATE 600-42.9 MG/5ML PO SUSR
500.0000 mg | Freq: Two times a day (BID) | ORAL | Status: DC
  Administered 2016-09-27 – 2016-09-29 (×5): 504 mg via ORAL
  Filled 2016-09-27 (×7): qty 4.2

## 2016-09-27 MED ORDER — METOCLOPRAMIDE HCL 10 MG PO TABS
5.0000 mg | ORAL_TABLET | Freq: Three times a day (TID) | ORAL | Status: DC
  Administered 2016-09-27 – 2016-10-26 (×58): 5 mg via ORAL
  Filled 2016-09-27 (×13): qty 1
  Filled 2016-09-27: qty 0.5
  Filled 2016-09-27 (×4): qty 1
  Filled 2016-09-27: qty 0.5
  Filled 2016-09-27 (×10): qty 1
  Filled 2016-09-27: qty 0.5
  Filled 2016-09-27 (×8): qty 1
  Filled 2016-09-27: qty 2
  Filled 2016-09-27 (×7): qty 1
  Filled 2016-09-27: qty 0.5
  Filled 2016-09-27 (×5): qty 1
  Filled 2016-09-27: qty 2
  Filled 2016-09-27: qty 1
  Filled 2016-09-27: qty 2
  Filled 2016-09-27 (×11): qty 1

## 2016-09-27 NOTE — Progress Notes (Signed)
[facility] NAME: Lakendra Helling    MR#:  [id number]  DATE OF BIRTH:  [birth year]  SUBJECTIVE:  CHIEF COMPLAINT:   Chief Complaint  Patient presents with  . Dysuria   The patient is a 75 year old female with past medical history significant for history of multiple medical problems including diastolic CHF, arthritis, bilateral lower extremity edema, venous insufficiency, recurrent UTIs, hypertension, diabetes, who presents to the hospital due to altered mental status, confusion, urinary retention. Apparently, the patient had about 1 L of urine in the bladder, Foley catheter was placed and patient was admitted, urinalysis was remarkable for pyuria. Patient was initiated on vancomycin and meropenem intravenously. She was complaining of abdominal pain yesterday, underwent CT of abdomen, revealing proctitis, concerning for possible severe constipation related proctitis, per her gastroenterologist today. Patient feels more comfortable today, still complains of abdominal pain on palpation, but she is much more alert and comfortable. Refuses to get up for physical therapy to move around, admits of improved oral intake   Review of Systems  Gastrointestinal: Positive for abdominal pain and constipation.    VITAL SIGNS: Blood pressure (!) 141/49, pulse 82, temperature 99.5 F (37.5 C), temperature source Oral, resp. rate (!) 24, height 5\' 8"  (1.727 m), weight 131.5 kg (290 lb), SpO2 91 %.  PHYSICAL EXAMINATION:   GENERAL:  75 y.o.-year-old patient lying in the bed with no acute distress. More alert and comfortable  EYES: Pupils equal, round, reactive to light and accommodation. No scleral icterus. Extraocular muscles intact.  HEENT: Head atraumatic, normocephalic. Oropharynx and nasopharynx clear.  NECK:  Supple, no jugular venous distention. No thyroid enlargement, no tenderness.  LUNGS: Normal breath sounds bilaterally, somewhat diminished at  bases, no wheezing, rales,rhonchi or crepitation. No use of accessory muscles of respiration.  CARDIOVASCULAR: S1, S2 normal. No murmurs, rubs, or gallops.  ABDOMEN: Soft, no significant tenderness on palpation, nondistended. Bowel sounds present. No organomegaly or mass.  EXTREMITIES: No pedal edema, cyanosis, or clubbing. Right lower extremity erythema in the anterior shin area NEUROLOGIC: Cranial nerves II through XII are intact. Muscle strength 5/5 in all extremities. Sensation intact. Gait not checked.  PSYCHIATRIC: The patient is alert , some anxious SKIN: No obvious rash, lesion, or ulcer.  Skin is scaly and thin , but no erythema in the right anterior shin  ORDERS/RESULTS REVIEWED:   CBC  Recent Labs Lab 09/24/16 1215 09/24/16 1835 09/25/16 0140 09/25/16 0940 09/25/16 1719  WBC 7.8  --  6.4  --   --   HGB 12.8 12.2 11.9* 11.5* 11.3*  HCT 40.0  --  36.1  --   --   PLT 240  --  238  --   --   MCV 84.7  --  82.4  --   --   MCH 27.2  --  27.2  --   --   MCHC 32.1  --  33.0  --   --   RDW 17.2*  --  17.0*  --   --    ------------------------------------------------------------------------------------------------------------------  Chemistries   Recent Labs Lab 09/24/16 1215 09/25/16 0140 09/27/16 0451  NA 139 139  --   K 4.7 4.7  --   CL 97* 98*  --   CO2 38* 35*  --   GLUCOSE 292* 222*  --   BUN 44* 43*  --   CREATININE 1.41* 1.32* 1.21*  CALCIUM 10.5* 10.0  --   AST 19  --   --  ALT 10*  --   --   ALKPHOS 63  --   --   BILITOT 0.6  --   --    ------------------------------------------------------------------------------------------------------------------ estimated creatinine clearance is 59.4 mL/min (by C-G formula based on SCr of 1.21 mg/dL (H)). ------------------------------------------------------------------------------------------------------------------ No results for input(s): TSH, T4TOTAL, T3FREE, THYROIDAB in the last 72 hours.  Invalid  input(s): FREET3  Cardiac Enzymes No results for input(s): CKMB, TROPONINI, MYOGLOBIN in the last 168 hours.  Invalid input(s): CK ------------------------------------------------------------------------------------------------------------------ Invalid input(s): POCBNP ---------------------------------------------------------------------------------------------------------------  RADIOLOGY: Ct Abdomen Pelvis Wo Contrast  Result Date: 09/25/2016 CLINICAL DATA:  Diffuse abdominal pain. EXAM: CT ABDOMEN AND PELVIS WITHOUT CONTRAST TECHNIQUE: Multidetector CT imaging of the abdomen and pelvis was performed following the standard protocol without IV contrast. COMPARISON:  September 06, 2014 FINDINGS: Lower chest: Mild dependent atelectasis is identified. No suspicious nodule, mass, or infiltrate. Cardiomegaly is identified. There are coronary artery calcifications. The patient is status post right mastectomy. Hepatobiliary: The patient is status post cholecystectomy. The liver is unremarkable. Pancreas: Unremarkable. No pancreatic ductal dilatation or surrounding inflammatory changes. Spleen: Normal in size without focal abnormality. Adrenals/Urinary Tract: The adrenal glands are normal. Mild bilateral perinephric stranding is likely nonacute. No hydronephrosis is seen bilaterally. Calcification posteriorly in the mid right kidney measuring 17 mm is consistent with stones within a midpole calyx. No other stones. No ureterectasis or ureteral stones. The bladder is decompressed with a Foley catheter. Stomach/Bowel: The stomach and small bowel are unremarkable. No bowel obstruction identified. Prominence of the rectal wall. Recommend clinical correlation. A few scattered colonic diverticuli are seen without diverticulitis. The remainder of the colon is normal. The appendix is normal. Vascular/Lymphatic: Atherosclerotic change throughout the aorta, iliac vessels common femoral vessels. No adenopathy.  Reproductive: Uterus and bilateral adnexa are unremarkable. Other: Increased attenuation of fat posteriorly in the pelvis is in a dependent location. This could be associated with the suggested rectal wall thickening. Musculoskeletal: Mild scalloping of the superior endplate of L5 is new since 2015 but there is no adjacent stranding to suggest that it is acute. IMPRESSION: 1. Prominence of the rectal wall with increased attenuation in the fat of the pelvis. Findings raise the possibility of proctitis. Recommend clinical correlation. 2. 17 mm stone posteriorly in the right kidney without obstruction. 3. Scalloping of the superior endplate of L5, not seen in 2015. No adjacent soft tissue swelling. Recommend clinical correlation to exclude point tenderness. Electronically Signed   By: Dorise Bullion III M.D   On: 09/25/2016 19:03    EKG:  Orders placed or performed during the hospital encounter of 02/23/16  . ED EKG  . ED EKG  . EKG    ASSESSMENT AND PLAN:  Active Problems:   Altered mental status #1 acute encephalopathy due to right lower extremity cellulitis, stercoral proctitis, suspected hypercapnia, improved on supportive therapy, IV fluids, antibiotic therapy, follow closely. Ammonia level was normal. Holding benzodiazepines, decreased oxygen delivery to keep pulse oximeter at around 90-93% due to CO2 retention #2. Diffuse abdominal pain , suspected constipation, CT scan of abdomen and pelvis revealed proctitis, suspected stercoral due to constipation, per gastroenterologist, discontinue meropenem,initiate oral Augmentin, continue regular diet, clinically improved #3. Sterile Pyuria, initially suspected recurrent urinary tract infection with urinary retention, urinary culture is negative, 1 blood culture negative so far, pending, urinary retention was likely due to severe constipation,not on antibiotic therapy #4. Renal insufficiency, chronic, CK D stage III, stable,following  closely #5.  Anemia. With rehydration, no active bleeding, follow in the  morning #6. Acute urinary retention, status post Foley catheter placement, related to constipation, discontinue Foley catheter in and out catheterization as needed #7, constipation, continue MiraLAX, patient did have a good stool output.  #8 proctitis, suspected stercoral, continue MiraLAX to relieve constipation, improved clinically, patient was seen by gastroenterologist, who did not recommend flexible sigmoidoscopy at this time, but conservative management, initiate patient on Augmentin to finish antibiotic therapy.  #9. Generalized weakness, get physical therapist involved for recommendations  Management plans discussed with the patient, family and they are in agreement.   DRUG ALLERGIES:  Allergies  Allergen Reactions  . Sulfa Antibiotics Hives  . Biaxin [Clarithromycin] Hives  . Eggs Or Egg-Derived Products Other (See Comments)    Pt states that her allergy test said she was allergic to eggs.    . Influenza A (H1n1) Monoval Vac Other (See Comments)    Pt states that she was told by her MD not to get the influenza vaccine.    . Morphine Other (See Comments)    Reaction:  Dizziness and confusion   . Pyridium [Phenazopyridine Hcl] Other (See Comments)    Reaction:  Unknown   . Ceftriaxone Anxiety  . Latex Rash  . Prednisone Rash  . Tape Rash    CODE STATUS:     Code Status Orders        Start     Ordered   09/24/16 1731  Full code  Continuous     09/24/16 1730    Code Status History    Date Active Date Inactive Code Status Order ID Comments User Context   09/01/2016  2:17 AM 09/05/2016  4:28 PM Full Code [id number]  Harvie Bridge, DO Inpatient   08/09/2016  8:27 PM 08/12/2016  8:15 PM Full Code [id number]  Henreitta Leber, MD Inpatient   09/30/2015  2:23 PM 10/07/2015  5:37 PM Full Code [id number]  Loletha Grayer, MD ED   09/05/2015  5:56 PM 09/09/2015  5:38 PM DNR [id number]  Loletha Grayer, MD ED     Advance Directive Documentation   [place] Most Recent Value  Type of Advance Directive  Living will  Pre-existing out of facility DNR order (yellow form or pink MOST form)  No data  "MOST" Form in Place?  No data      TOTAL TIME TAKING CARE OF THIS PATIENT: 40 minutes.    Theodoro Grist M.D on 09/27/2016 at 4:55 PM  Between 7am to 6pm - Pager - [phone]  After 6pm go to [email or web address] - password [email or web address] [facility]  [facility]  Office  [phone]  CC: Primary care physician; Leonel Ramsay, MD

## 2016-09-27 NOTE — Progress Notes (Signed)
Carolyn Lame, MD [facility]   [address], [place],  [address] Phone: [phone] Fax : [phone]   Subjective: The patient denies any further abdominal pain and states that her rectal pain is more common when she is being cleaned when she soils herself.   Objective: Vital signs in last 24 hours: Vitals:   09/26/16 2219 09/27/16 0453 09/27/16 0925 09/27/16 1327  BP: (!) 140/51 (!) 150/51  (!) 141/49  Pulse: 80 71  82  Resp:  (!) 21  (!) 24  Temp:  99.5 F (37.5 C)    TempSrc:  Oral    SpO2:  97% 95% 91%  Weight:      Height:       Weight change:   Intake/Output Summary (Last 24 hours) at 09/27/16 1739 Last data filed at 09/27/16 1722  Gross per 24 hour  Intake              250 ml  Output             1300 ml  Net            -1050 ml     Exam: Heart:: Regular rate and rhythm Lungs: normal Abdomen: soft, nontender, normal bowel sounds Extremities with positive lower extremity edema without cyanosis or clubbing   Lab Results: @LABTEST2 @ Micro Results: Recent Results (from the past 240 hour(s))  Urine culture     Status: None   Collection Time: 09/24/16 12:15 PM  Result Value Ref Range Status   Specimen Description URINE, RANDOM  Final   Special Requests NONE  Final   Culture NO GROWTH Performed at [facility]   Final   Report Status 09/26/2016 FINAL  Final  CULTURE, BLOOD (ROUTINE X 2) w Reflex to ID Panel     Status: None (Preliminary result)   Collection Time: 09/24/16  6:35 PM  Result Value Ref Range Status   Specimen Description BLOOD  RIGHT AC  Final   Special Requests   Final    BOTTLES DRAWN AEROBIC AND ANAEROBIC AER 8ML ANA 9ML   Culture NO GROWTH 3 DAYS  Final   Report Status PENDING  Incomplete   Studies/Results: Ct Abdomen Pelvis Wo Contrast  Result Date: 09/25/2016 CLINICAL DATA:  Diffuse abdominal pain. EXAM: CT ABDOMEN AND PELVIS WITHOUT CONTRAST TECHNIQUE: Multidetector CT imaging of the abdomen and pelvis was  performed following the standard protocol without IV contrast. COMPARISON:  September 06, 2014 FINDINGS: Lower chest: Mild dependent atelectasis is identified. No suspicious nodule, mass, or infiltrate. Cardiomegaly is identified. There are coronary artery calcifications. The patient is status post right mastectomy. Hepatobiliary: The patient is status post cholecystectomy. The liver is unremarkable. Pancreas: Unremarkable. No pancreatic ductal dilatation or surrounding inflammatory changes. Spleen: Normal in size without focal abnormality. Adrenals/Urinary Tract: The adrenal glands are normal. Mild bilateral perinephric stranding is likely nonacute. No hydronephrosis is seen bilaterally. Calcification posteriorly in the mid right kidney measuring 17 mm is consistent with stones within a midpole calyx. No other stones. No ureterectasis or ureteral stones. The bladder is decompressed with a Foley catheter. Stomach/Bowel: The stomach and small bowel are unremarkable. No bowel obstruction identified. Prominence of the rectal wall. Recommend clinical correlation. A few scattered colonic diverticuli are seen without diverticulitis. The remainder of the colon is normal. The appendix is normal. Vascular/Lymphatic: Atherosclerotic change throughout the aorta, iliac vessels common femoral vessels. No adenopathy. Reproductive: Uterus and bilateral adnexa are unremarkable. Other: Increased attenuation of fat posteriorly in  the pelvis is in a dependent location. This could be associated with the suggested rectal wall thickening. Musculoskeletal: Mild scalloping of the superior endplate of L5 is new since 2015 but there is no adjacent stranding to suggest that it is acute. IMPRESSION: 1. Prominence of the rectal wall with increased attenuation in the fat of the pelvis. Findings raise the possibility of proctitis. Recommend clinical correlation. 2. 17 mm stone posteriorly in the right kidney without obstruction. 3. Scalloping of  the superior endplate of L5, not seen in 2015. No adjacent soft tissue swelling. Recommend clinical correlation to exclude point tenderness. Electronically Signed   By: Dorise Bullion III M.D   On: 09/25/2016 19:03   Medications: I have reviewed the patient's current medications. Scheduled Meds: . amLODipine  2.5 mg Oral Daily  . amoxicillin-clavulanate  500 mg of amoxicillin Oral BID  . atorvastatin  20 mg Oral QHS  . conjugated estrogens  1 Applicatorful Vaginal Once per day on Mon Wed Fri  . desmopressin  0.2 mg Oral QHS  . enoxaparin (LOVENOX) injection  40 mg Subcutaneous Q12H  . insulin aspart  0-5 Units Subcutaneous QHS  . insulin aspart  0-9 Units Subcutaneous TID WC  . insulin glargine  50 Units Subcutaneous QHS  . iron polysaccharides  150 mg Oral TID  . loratadine  10 mg Oral Daily  . metoCLOPramide  5 mg Oral TID AC  . metoprolol succinate  25 mg Oral BID  . nystatin   Topical TID  . pantoprazole  40 mg Oral BID  . polyethylene glycol  17 g Oral TID  . potassium chloride SA  20 mEq Oral Daily  . vitamin E  1,000 Units Oral Daily   Continuous Infusions: PRN Meds:.acetaminophen **OR** acetaminophen, alum & mag hydroxide-simeth, ondansetron **OR** ondansetron (ZOFRAN) IV   Assessment: Active Problems:   Altered mental status    Plan: This patient was admitted with altered mental status and was found to have an abnormal CT scan finding consistent with a rectal ulcer and constipation. The patient is moving her bowels more frequently on her MiraLAX. I would continue the MiraLAX and back off if the patient should start having diarrhea. Nothing further to do a GI point of view. I will sign off.  Please call if any further GI concerns or questions.  We would like to thank you for the opportunity to participate in the care of Carolyn Valdez.    LOS: 3 days   Carolyn Valdez 09/27/2016, 5:39 PM

## 2016-09-27 NOTE — Progress Notes (Signed)
Dr hugelmeier notified of pt's bp 140/51, ordered to hold pm dose of bp med.

## 2016-09-27 NOTE — Progress Notes (Signed)
ANTIBIOTIC CONSULT NOTE - INITIAL  Pharmacy Consult for Meropenem, Vancomycin  Indication: UTI  Allergies  Allergen Reactions  . Sulfa Antibiotics Hives  . Biaxin [Clarithromycin] Hives  . Eggs Or Egg-Derived Products Other (See Comments)    Pt states that her allergy test said she was allergic to eggs.    . Influenza A (H1n1) Monoval Vac Other (See Comments)    Pt states that she was told by her MD not to get the influenza vaccine.    . Morphine Other (See Comments)    Reaction:  Dizziness and confusion   . Pyridium [Phenazopyridine Hcl] Other (See Comments)    Reaction:  Unknown   . Ceftriaxone Anxiety  . Latex Rash  . Prednisone Rash  . Tape Rash    Patient Measurements: Height: 5\' 8"  (172.7 cm) Weight: 290 lb (131.5 kg) IBW/kg (Calculated) : 63.9 Adjusted Body Weight: 90.94 kg   Vital Signs: Temp: 99.5 F (37.5 C) (12/11 0453) Temp Source: Oral (12/11 0453) BP: 150/51 (12/11 0453) Pulse Rate: 71 (12/11 0453) Intake/Output from previous day: 12/10 0701 - 12/11 0700 In: 50 [IV Piggyback:50] Out: 1000 [Urine:1000] Intake/Output from this shift: No intake/output data recorded.  Labs:  Recent Labs  09/24/16 1215  09/25/16 0140 09/25/16 0940 09/25/16 1719 09/27/16 0451  WBC 7.8  --  6.4  --   --   --   HGB 12.8  < > 11.9* 11.5* 11.3*  --   PLT 240  --  238  --   --   --   CREATININE 1.41*  --  1.32*  --   --  1.21*  < > = values in this interval not displayed. Estimated Creatinine Clearance: 59.4 mL/min (by C-G formula based on SCr of 1.21 mg/dL (H)). No results for input(s): VANCOTROUGH, VANCOPEAK, VANCORANDOM, GENTTROUGH, GENTPEAK, GENTRANDOM, TOBRATROUGH, TOBRAPEAK, TOBRARND, AMIKACINPEAK, AMIKACINTROU, AMIKACIN in the last 72 hours.   Microbiology: Recent Results (from the past 720 hour(s))  Urine culture     Status: Abnormal   Collection Time: 08/31/16  9:11 PM  Result Value Ref Range Status   Specimen Description URINE, CLEAN CATCH  Final   Special  Requests NONE  Final   Culture (A)  Final    >=100,000 COLONIES/mL METHICILLIN RESISTANT STAPHYLOCOCCUS AUREUS   Report Status 09/03/2016 FINAL  Final   Organism ID, Bacteria METHICILLIN RESISTANT STAPHYLOCOCCUS AUREUS (A)  Final      Susceptibility   Methicillin resistant staphylococcus aureus - MIC*    CIPROFLOXACIN >=8 RESISTANT Resistant     GENTAMICIN <=0.5 SENSITIVE Sensitive     NITROFURANTOIN 32 SENSITIVE Sensitive     OXACILLIN >=4 RESISTANT Resistant     TETRACYCLINE 2 SENSITIVE Sensitive     VANCOMYCIN 1 SENSITIVE Sensitive     TRIMETH/SULFA <=10 SENSITIVE Sensitive     CLINDAMYCIN >=8 RESISTANT Resistant     RIFAMPIN <=0.5 SENSITIVE Sensitive     Inducible Clindamycin NEGATIVE Sensitive     * >=100,000 COLONIES/mL METHICILLIN RESISTANT STAPHYLOCOCCUS AUREUS  MRSA PCR Screening     Status: Abnormal   Collection Time: 09/01/16  2:45 AM  Result Value Ref Range Status   MRSA by PCR POSITIVE (A) NEGATIVE Final    Comment:        The GeneXpert MRSA Assay (FDA approved for NASAL specimens only), is one component of a comprehensive MRSA colonization surveillance program. It is not intended to diagnose MRSA infection nor to guide or monitor treatment for MRSA infections. RESULT CALLED TO, READ BACK  BY AND VERIFIED WITH: LEXI MILLER AT 0502 09/01/16.PMH   Urine culture     Status: None   Collection Time: 09/24/16 12:15 PM  Result Value Ref Range Status   Specimen Description URINE, RANDOM  Final   Special Requests NONE  Final   Culture NO GROWTH Performed at [facility]   Final   Report Status 09/26/2016 FINAL  Final  CULTURE, BLOOD (ROUTINE X 2) w Reflex to ID Panel     Status: None (Preliminary result)   Collection Time: 09/24/16  6:35 PM  Result Value Ref Range Status   Specimen Description BLOOD  RIGHT AC  Final   Special Requests   Final    BOTTLES DRAWN AEROBIC AND ANAEROBIC AER 8ML ANA 9ML   Culture NO GROWTH 3 DAYS  Final   Report Status PENDING   Incomplete    Medical History: Past Medical History:  Diagnosis Date  . Anxiety   . Breast cancer ([place])    16 yrs ago  . CHF (congestive heart failure) ([place])   . Diabetes mellitus without complication ([place])   . DJD (degenerative joint disease)   . Dysuria   . H/O total knee replacement    right  . HTN (hypertension)   . Hypercholesteremia   . Kidney stone   . Obesity   . Recurrent urinary tract infection   . SVT (supraventricular tachycardia) ([place])   . Urinary incontinence   . Vaginal atrophy   . Yeast vaginitis     Medications:  Prescriptions Prior to Admission  Medication Sig Dispense Refill Last Dose  . amLODipine (NORVASC) 2.5 MG tablet Take 1 tablet (2.5 mg total) by mouth daily. 30 tablet 0 09/24/2016 at 0900  . atorvastatin (LIPITOR) 20 MG tablet Take 20 mg by mouth at bedtime.   09/23/2016 at 2100  . clonazePAM (KLONOPIN) 1 MG tablet Take 1 mg by mouth 3 (three) times daily.   09/24/2016 at 0900  . conjugated estrogens (PREMARIN) vaginal cream Place 1 Applicatorful vaginally 3 (three) times a week. Pt uses on Monday, Wednesday, and Friday.   Past Week at Unknown time  . desmopressin (DDAVP) 0.2 MG tablet Take 1 tablet (0.2 mg total) by mouth at bedtime. 90 tablet 3 09/23/2016 at 2100  . docusate sodium (COLACE) 100 MG capsule Take 200 mg by mouth 2 (two) times daily.   09/24/2016 at 0900  . fexofenadine (ALLEGRA) 180 MG tablet Take 180 mg by mouth daily.   09/24/2016 at 0900  . furosemide (LASIX) 20 MG tablet Take 1 tablet (20 mg total) by mouth daily. 30 tablet 0 09/24/2016 at 0900  . gabapentin (NEURONTIN) 300 MG capsule Take 300-600 mg by mouth 3 (three) times daily. Pt takes two capsules in the morning, one capsule at lunch, and two capsules in the evening.   09/24/2016 at 0900  . hydrOXYzine (ATARAX/VISTARIL) 25 MG tablet Take 25 mg by mouth at bedtime.    09/23/2016 at 2100  . insulin aspart (NOVOLOG) 100 UNIT/ML injection Inject 0-20 Units into the skin 3 (three) times  daily with meals. 10 mL 11 09/24/2016 at 1130  . insulin aspart (NOVOLOG) 100 UNIT/ML injection Inject 0-5 Units into the skin at bedtime. 10 mL 11 09/23/2016 at 2100  . insulin glargine (LANTUS) 100 UNIT/ML injection Inject 50 Units into the skin at bedtime.   09/23/2016 at 2100  . iron polysaccharides (NIFEREX) 150 MG capsule Take 150 mg by mouth 3 (three) times daily.    09/24/2016 at  0900  . metoprolol succinate (TOPROL-XL) 25 MG 24 hr tablet Take 1 tablet (25 mg total) by mouth 2 (two) times daily. 30 tablet 0 09/24/2016 at 0900  . nystatin (MYCOSTATIN/NYSTOP) powder Apply topically 3 (three) times daily. 15 g 0 09/24/2016 at 0900  . omeprazole (PRILOSEC) 40 MG capsule Take 40 mg by mouth 2 (two) times daily.   09/24/2016 at 0900  . ondansetron (ZOFRAN ODT) 4 MG disintegrating tablet Take 1 tablet (4 mg total) by mouth every 8 (eight) hours as needed for nausea or vomiting. 20 tablet 0 prn at prn  . pioglitazone (ACTOS) 30 MG tablet Take 30 mg by mouth daily.   09/24/2016 at 0900  . potassium chloride SA (K-DUR,KLOR-CON) 20 MEQ tablet Take 1 tablet (20 mEq total) by mouth daily. 30 tablet 0 09/24/2016 at 0900  . VESICARE 10 MG tablet Take 1 tablet (10 mg total) by mouth daily. 90 tablet 3 09/24/2016 at 0900  . vitamin E 1000 UNIT capsule Take 1,000 Units by mouth daily.   09/24/2016 at 0900   Assessment: 75 y/o F with a h/o MRSA and ESBL UTI admitted with AMS. Pharmacy consulted to dose vancomycin and meropenem for LE cellulitis and UTI.   Goal of Therapy:  Vancomycin trough 10-15 mcg/ml  Plan:  Expected duration 7 days with resolution of temperature and/or normalization of WBC   Continue meropenem 1 g iv q 8 hours.   Continue vancomycin 1250 mg iv q 18 hours. Vancomycin trough not obtained before dose administered. Will check trough with the next dose and f/u plans for abx with MD rounds.   Ulice Dash, PharmD Clinical Pharmacist  09/27/2016,10:41 AM

## 2016-09-27 NOTE — Progress Notes (Signed)
BP 147/55. Metoprolol held per MD order.

## 2016-09-27 NOTE — Progress Notes (Signed)
This RN spoke with [facility] RN who informed me that pt is on chronic O2 although we were in the process of weaning the pt to rm air. We successfully weaned pt to rm air and MD preferred to keep O2 off if sats were WNL since pt had increased CO2 and she is at risk to retain. Also asked if pt could resume her normal medication regimen of klonopin but MD preferred to continue holding medication since it makes pt lethargic but we will closely watch for any signs of withdrawal from this medication.

## 2016-09-27 NOTE — Progress Notes (Signed)
[facility]  Patient Status: Active  [facility] is providing the following services: SN/PT/SW  If patient discharges after hours, please call [phone].   Carolyn Valdez 09/27/2016, 1:51 PM

## 2016-09-27 NOTE — Evaluation (Signed)
Physical Therapy Evaluation Patient Details Name: Carolyn Valdez MRN: [id number] DOB: September 14, 1941 Today's Date: 09/27/2016   History of Present Illness  Pt is a 75 y/o F who presents to the hospital due to AMS, confusion, urinary retention.  There is concern for possible severe constipation related proctitis.  Admiting dx: acute encephalopathy due to RLE cellulitis, stercoral proctitis.  Pt's PMH includes CHF, BLE edema, venous insufficiency, recurrent UTIs, obesity, SVT, TKA,  per pt L sciatica.    Clinical Impression  Pt admitted with above diagnosis. Pt currently with functional limitations due to the deficits listed below (see PT Problem List). Carolyn Valdez presents with cognitive deficits documented below and is a poor historian with no insight into PLOF or assist available.  She reports she has a lift and a hospital bed at home.  She likely required assist for all ADLs PTA and pt reports she has been non-ambulatory for several months and spends most of her time in bed.  RN notified of importance of repositioning schedule every 2 hrs and PT will follow acutely for family education on this topic as well as ROM.  Today pt tolerated all therapeutic exercises well but reported L sciatica pain (chronic per pt) with L hip mobility exercises that were completed to pt's tolerance.  Given pt's current mobility status and unknown level of assist available at home, recommending SNF at d/c.  Pt will benefit from skilled PT to increase their independence and safety with mobility to allow discharge to the venue listed below.      Follow Up Recommendations SNF    Equipment Recommendations  Other (comment) (TBD at next venue of care,pt has lift and hospital bed)    Recommendations for Other Services       Precautions / Restrictions Precautions Precautions: Fall Precaution Comments: pt reports h/o L sciatica which has flared since admission Restrictions Weight Bearing Restrictions: No      Mobility  Bed  Mobility               General bed mobility comments: Did not attempt as pt not able at baseline and presents with L hip pain with minimal mobility  Transfers                    Ambulation/Gait                Stairs            Wheelchair Mobility    Modified Rankin (Stroke Patients Only)       Balance                                             Pertinent Vitals/Pain Pain Assessment: Faces Faces Pain Scale: Hurts even more Pain Location: L hip region due to sciatica with L hip mobility Pain Descriptors / Indicators: Sharp Pain Intervention(s): Limited activity within patient's tolerance;Monitored during session    Home Living Family/patient expects to be discharged to:: Skilled nursing facility Living Arrangements: Alone               Additional Comments: Pt unable to provide any information about home layout, assist available, daily routine and information from RN is that pt has a caregiver that comes on occassion and family that provides assist but that pt lives alone.      Prior Function Level of Independence: Needs assistance  Gait / Transfers Assistance Needed: Per RN pt using lift at baseline for transfers.  Per pt she has not been ambulating for several months  ADL's / Homemaking Assistance Needed: Needs assist with all ADLs        Hand Dominance        Extremity/Trunk Assessment   Upper Extremity Assessment: Generalized weakness           Lower Extremity Assessment: Generalized weakness (h/o BLE neuropathy)         Communication   Communication: No difficulties  Cognition Arousal/Alertness: Awake/alert Behavior During Therapy: Flat affect Overall Cognitive Status: No family/caregiver present to determine baseline cognitive functioning                 General Comments: Pt is able to report name, DOB, location, but is unable to report the exact date.  She is unable to provide  information regarding home layout, PLOF, or assist available.      General Comments General comments (skin integrity, edema, etc.): At end of session pt reports, "I'm about to have a bowel movement".  When this PT offerred to assist pt onto bed pan she declined saying she didn't want to get stuck on it and that she would rather go in her diaper.  RN made aware.  Therefore, did not initiate positioning schedule but did inform RN of importance of positioning schedule for pt to be rotated every 2 hrs.    Exercises General Exercises - Upper Extremity Shoulder Flexion: AROM;Both;10 reps;Supine General Exercises - Lower Extremity Ankle Circles/Pumps: AROM;Both;10 reps;Supine Quad Sets: Strengthening;Both;10 reps;Supine Heel Slides: AAROM;Both;10 reps;Supine Hip ABduction/ADduction: AAROM;Both;10 reps;Supine Straight Leg Raises: AAROM;Both;10 reps;Supine   Assessment/Plan    PT Assessment Patient needs continued PT services  PT Problem List Decreased strength;Decreased range of motion;Decreased activity tolerance;Decreased balance;Decreased mobility;Decreased cognition;Decreased knowledge of use of DME;Decreased safety awareness;Decreased knowledge of precautions;Pain;Obesity;Impaired sensation          PT Treatment Interventions DME instruction;Functional mobility training;Therapeutic activities;Therapeutic exercise;Neuromuscular re-education;Cognitive remediation;Patient/family education;Manual techniques;Modalities    PT Goals (Current goals can be found in the Care Plan section)  Acute Rehab PT Goals Patient Stated Goal: I don't want to use the lift PT Goal Formulation: With patient Time For Goal Achievement: 10/11/16 Potential to Achieve Goals: Fair    Frequency Min 1X/week (for family education only)   Barriers to discharge   Unsure of assist available at d/c of home environment    Co-evaluation               End of Session   Activity Tolerance: Patient limited by  pain;Patient limited by fatigue Patient left: in bed;with call bell/phone within reach;with bed alarm set Nurse Communication: Mobility status;Other (comment);Need for lift equipment (positioning schedule, pt about to have a BM)         Time: 1430-1450 PT Time Calculation (min) (ACUTE ONLY): 20 min   Charges:   PT Evaluation $PT Eval Low Complexity: 1 Procedure     PT G Codes:        Collie Siad PT, DPT 09/27/2016, 3:14 PM

## 2016-09-28 DIAGNOSIS — R410 Disorientation, unspecified: Secondary | ICD-10-CM

## 2016-09-28 DIAGNOSIS — F05 Delirium due to known physiological condition: Secondary | ICD-10-CM

## 2016-09-28 LAB — CBC
HEMATOCRIT: 34.9 % — AB (ref 35.0–47.0)
HEMOGLOBIN: 11.7 g/dL — AB (ref 12.0–16.0)
MCH: 27.1 pg (ref 26.0–34.0)
MCHC: 33.5 g/dL (ref 32.0–36.0)
MCV: 81 fL (ref 80.0–100.0)
Platelets: 254 10*3/uL (ref 150–440)
RBC: 4.31 MIL/uL (ref 3.80–5.20)
RDW: 17.1 % — AB (ref 11.5–14.5)
WBC: 6.1 10*3/uL (ref 3.6–11.0)

## 2016-09-28 LAB — GLUCOSE, CAPILLARY
GLUCOSE-CAPILLARY: 237 mg/dL — AB (ref 65–99)
Glucose-Capillary: 182 mg/dL — ABNORMAL HIGH (ref 65–99)
Glucose-Capillary: 205 mg/dL — ABNORMAL HIGH (ref 65–99)
Glucose-Capillary: 166 mg/dL — ABNORMAL HIGH (ref 65–99)

## 2016-09-28 MED ORDER — DOCUSATE SODIUM 100 MG PO CAPS
100.0000 mg | ORAL_CAPSULE | Freq: Two times a day (BID) | ORAL | Status: DC
  Administered 2016-09-28 – 2016-10-26 (×35): 100 mg via ORAL
  Filled 2016-09-28 (×47): qty 1

## 2016-09-28 MED ORDER — DARIFENACIN HYDROBROMIDE ER 7.5 MG PO TB24
15.0000 mg | ORAL_TABLET | Freq: Every day | ORAL | Status: DC
  Administered 2016-09-29 – 2016-10-26 (×23): 15 mg via ORAL
  Filled 2016-09-28 (×24): qty 2

## 2016-09-28 MED ORDER — HALOPERIDOL LACTATE 5 MG/ML IJ SOLN
5.0000 mg | Freq: Once | INTRAMUSCULAR | Status: AC
  Administered 2016-09-28: 5 mg via INTRAMUSCULAR
  Filled 2016-09-28: qty 1

## 2016-09-28 MED ORDER — SENNA 8.6 MG PO TABS
1.0000 | ORAL_TABLET | Freq: Every day | ORAL | Status: DC
  Administered 2016-09-29 – 2016-10-26 (×22): 8.6 mg via ORAL
  Filled 2016-09-28 (×25): qty 1

## 2016-09-28 NOTE — Progress Notes (Signed)
Pt was in and out cathed. Urine return was 262ml.

## 2016-09-28 NOTE — Consult Note (Signed)
[facility] Consult   Reason for Consult:  Consult for 75 year old woman with no past psychiatric history currently recovering from infections Referring Physician:  Ether Griffins Patient Identification: Carolyn Valdez MRN:  [id number] Principal Diagnosis: Subacute delirium Diagnosis:   Patient Active Problem List   Diagnosis Date Noted  . Subacute delirium [F05] 09/28/2016  . Gastrointestinal hemorrhage [K92.2]   . Diffuse abdominal pain [R10.84]   . Altered mental status [R41.82] 09/24/2016  . Pressure injury of skin [L89.90] 09/02/2016  . Respiratory failure with hypoxia ([place]) [J96.91] 09/01/2016  . Lymphedema [I89.0] 08/16/2016  . CHF (congestive heart failure) ([place]) [I50.9] 08/09/2016  . Congestive heart failure ([place]) [I50.9] 11/25/2015  . Nocturia [R35.1] 11/05/2015  . Urinary frequency [R35.0] 11/05/2015  . Acute respiratory failure with hypoxia ([place]) [J96.01] 09/05/2015  . Recurrent UTI [N39.0] 04/20/2015  . Incontinence [R32] 04/20/2015  . Diabetes mellitus, type 2 ([place]) [E11.9] 04/17/2015  . ESBL (extended spectrum beta-lactamase) producing bacteria infection [A49.9, Z16.12] 04/17/2015  . BP (high blood pressure) [I10] 04/17/2015  . Frequent UTI [N39.0] 04/17/2015  . Absence of bladder continence [R32] 04/17/2015  . Iron deficiency anemia [D50.9] 03/08/2015  . Absolute anemia [D64.9] 09/25/2014  . Abdominal pain, lower [R10.30] 09/25/2014  . Urge incontinence [N39.41] 02/19/2013  . FOM (frequency of micturition) [R35.0] 02/19/2013  . Bladder infection, chronic [N30.20] 08/11/2012  . Difficult or painful urination [R30.0] 07/26/2012  . Lower urinary tract infection [N39.0] 12/31/2011  . Diabetes mellitus ([place]) [E11.9] 12/31/2011    Total Time spent with patient: 1 hour  Subjective:   Carolyn Valdez is a 75 y.o. female patient admitted with "I had a urinary tract infection".  HPI:  Patient interviewed. Chart reviewed. Her sister was also present and provided  some information. 75 year old woman in the hospital recovering from what looks like the second of 2 urinary tract infections in a row. Consult was requested because of concern about persistent confusion. Patient tells me she knows she is in the hospital and that she is here because of a urinary tract infection. She says she is feeling better and feels like she is back to her normal baseline. Sister tells me that they're of still been some spells intermittently with the patient seems to of had hallucinations but the last time she was aware of that was yesterday. Patient denies it and has no memory of reporting any hallucinations. The patient denies any depression. Denies having any sleep problems. Denies any suicidal or homicidal ideation. She denies being aware of any memory problems at home. Patient is rather defensive about talking with me and I think probably under report some of the symptoms but the sister also supports that at her baseline the patient was not having significant pathology.  Social history: Patient lives independently. Has assistance from family members at times.  Medical history: Recovering urinary tract infection.  Substance abuse history: Denies any new and no information to suggest any history of substance abuse  Past Psychiatric History: Patient thinks she may have had some mild depression in the past but has no history of hospitalization no history of suicide no history of psychosis. Had not been diagnosed with significant dementia prior to admission. No psychiatric medicines on board. Patient's sister tells me that the outpatient physician had strongly felt that the mental status changes were due to delirium from medical disease.  Risk to Self: Is patient at risk for suicide?: No Risk to Others:   Prior Inpatient Therapy:   Prior Outpatient Therapy:  Past Medical History:  Past Medical History:  Diagnosis Date  . Anxiety   . Breast cancer ([place])    16 yrs ago  . CHF  (congestive heart failure) ([place])   . Diabetes mellitus without complication ([place])   . DJD (degenerative joint disease)   . Dysuria   . H/O total knee replacement    right  . HTN (hypertension)   . Hypercholesteremia   . Kidney stone   . Obesity   . Recurrent urinary tract infection   . SVT (supraventricular tachycardia) ([place])   . Urinary incontinence   . Vaginal atrophy   . Yeast vaginitis     Past Surgical History:  Procedure Laterality Date  . CARDIAC CATHETERIZATION    . CHOLECYSTECTOMY    . FOOT SURGERY    . JOINT REPLACEMENT    . MASTECTOMY Right 1997  . NASAL SEPTUM SURGERY    . PERIPHERAL VASCULAR CATHETERIZATION N/A 07/08/2015   Procedure: PICC Line Insertion;  Surgeon: Algernon Huxley, MD;  Location: [facility];  Service: Cardiovascular;  Laterality: N/A;  . TONSILLECTOMY    . Vocal cords     Family History:  Family History  Problem Relation Age of Onset  . Lung cancer Father   . Hematuria Mother   . Lung cancer Mother   . Kidney disease Neg Hx   . Bladder Cancer Neg Hx   . Breast cancer Neg Hx    Family Psychiatric  History: No known family history Social History:  History  Alcohol Use No     History  Drug Use No    Social History   Social History  . Marital status: Widowed    Spouse name: N/A  . Number of children: N/A  . Years of education: N/A   Social History Main Topics  . Smoking status: Former Research scientist (life sciences)  . Smokeless tobacco: Former Systems developer    Quit date: 10/29/1995  . Alcohol use No  . Drug use: No  . Sexual activity: Not Asked   Other Topics Concern  . None   Social History Narrative  . None   Additional Social History:    Allergies:   Allergies  Allergen Reactions  . Sulfa Antibiotics Hives  . Biaxin [Clarithromycin] Hives  . Eggs Or Egg-Derived Products Other (See Comments)    Pt states that her allergy test said she was allergic to eggs.    . Influenza A (H1n1) Monoval Vac Other (See Comments)    Pt states that she was  told by her MD not to get the influenza vaccine.    . Morphine Other (See Comments)    Reaction:  Dizziness and confusion   . Pyridium [Phenazopyridine Hcl] Other (See Comments)    Reaction:  Unknown   . Ceftriaxone Anxiety  . Latex Rash  . Prednisone Rash  . Tape Rash    Labs:  Results for orders placed or performed during the hospital encounter of 09/24/16 (from the past 48 hour(s))  Blood gas, arterial     Status: Abnormal   Collection Time: 09/26/16  9:05 PM  Result Value Ref Range   FIO2 0.28    Delivery systems NASAL CANNULA    pH, Arterial 7.45 7.350 - 7.450   pCO2 arterial 52 (H) 32.0 - 48.0 mmHg   pO2, Arterial 70 (L) 83.0 - 108.0 mmHg   Bicarbonate 36.1 (H) 20.0 - 28.0 mmol/L   Acid-Base Excess 10.5 (H) 0.0 - 2.0 mmol/L   O2 Saturation 94.6 %  Patient temperature 37.0    Collection site LEFT RADIAL    Sample type ARTERIAL DRAW    Allens test (pass/fail) PASS PASS  Glucose, capillary     Status: Abnormal   Collection Time: 09/26/16  9:40 PM  Result Value Ref Range   Glucose-Capillary 183 (H) 65 - 99 mg/dL  Creatinine, serum     Status: Abnormal   Collection Time: 09/27/16  4:51 AM  Result Value Ref Range   Creatinine, Ser 1.21 (H) 0.44 - 1.00 mg/dL   GFR calc non Af Amer 43 (L) >60 mL/min   GFR calc Af Amer 50 (L) >60 mL/min    Comment: (NOTE) The eGFR has been calculated using the CKD EPI equation. This calculation has not been validated in all clinical situations. eGFR's persistently <60 mL/min signify possible Chronic Kidney Disease.   Glucose, capillary     Status: Abnormal   Collection Time: 09/27/16  7:55 AM  Result Value Ref Range   Glucose-Capillary 173 (H) 65 - 99 mg/dL   Comment 1 Notify RN   Vancomycin, trough     Status: Abnormal   Collection Time: 09/27/16 10:34 AM  Result Value Ref Range   Vancomycin Tr 31 (HH) 15 - 20 ug/mL    Comment: CRITICAL RESULT CALLED TO, READ BACK BY AND VERIFIED WITH KAREN HAYES AT 1120 09/27/16 DAS   Glucose,  capillary     Status: Abnormal   Collection Time: 09/27/16 12:38 PM  Result Value Ref Range   Glucose-Capillary 169 (H) 65 - 99 mg/dL   Comment 1 Notify RN   Glucose, capillary     Status: Abnormal   Collection Time: 09/27/16  4:56 PM  Result Value Ref Range   Glucose-Capillary 166 (H) 65 - 99 mg/dL   Comment 1 Notify RN   Glucose, capillary     Status: Abnormal   Collection Time: 09/27/16  9:09 PM  Result Value Ref Range   Glucose-Capillary 179 (H) 65 - 99 mg/dL  Glucose, capillary     Status: Abnormal   Collection Time: 09/27/16 11:17 PM  Result Value Ref Range   Glucose-Capillary 171 (H) 65 - 99 mg/dL  CBC     Status: Abnormal   Collection Time: 09/28/16  5:21 AM  Result Value Ref Range   WBC 6.1 3.6 - 11.0 K/uL   RBC 4.31 3.80 - 5.20 MIL/uL   Hemoglobin 11.7 (L) 12.0 - 16.0 g/dL   HCT 34.9 (L) 35.0 - 47.0 %   MCV 81.0 80.0 - 100.0 fL   MCH 27.1 26.0 - 34.0 pg   MCHC 33.5 32.0 - 36.0 g/dL   RDW 17.1 (H) 11.5 - 14.5 %   Platelets 254 150 - 440 K/uL  Glucose, capillary     Status: Abnormal   Collection Time: 09/28/16  7:33 AM  Result Value Ref Range   Glucose-Capillary 182 (H) 65 - 99 mg/dL  Glucose, capillary     Status: Abnormal   Collection Time: 09/28/16 11:29 AM  Result Value Ref Range   Glucose-Capillary 205 (H) 65 - 99 mg/dL  Glucose, capillary     Status: Abnormal   Collection Time: 09/28/16  4:31 PM  Result Value Ref Range   Glucose-Capillary 237 (H) 65 - 99 mg/dL    Current Facility-Administered Medications  Medication Dose Route Frequency Provider Last Rate Last Dose  . acetaminophen (TYLENOL) tablet 650 mg  650 mg Oral Q6H PRN Gladstone Lighter, MD   650 mg at 09/27/16 0920  Or  . acetaminophen (TYLENOL) suppository 650 mg  650 mg Rectal Q6H PRN Gladstone Lighter, MD      . alum & mag hydroxide-simeth (MAALOX/MYLANTA) 200-200-20 MG/5ML suspension 30 mL  30 mL Oral Q4H PRN Theodoro Grist, MD   30 mL at 09/25/16 2359  . amLODipine (NORVASC) tablet 2.5 mg   2.5 mg Oral Daily Gladstone Lighter, MD      . amoxicillin-clavulanate (AUGMENTIN) 600-42.9 MG/5ML suspension 504 mg  500 mg of amoxicillin Oral BID Theodoro Grist, MD   504 mg at 09/28/16 1453  . atorvastatin (LIPITOR) tablet 20 mg  20 mg Oral QHS Gladstone Lighter, MD   20 mg at 09/27/16 2316  . conjugated estrogens (PREMARIN) vaginal cream 1 Applicatorful  1 Applicatorful Vaginal Once per day on Mon Wed Fri Gladstone Lighter, MD   1 Applicatorful at 21/03/12 0909  . [START ON 09/29/2016] darifenacin (ENABLEX) 24 hr tablet 15 mg  15 mg Oral Daily Theodoro Grist, MD      . desmopressin (DDAVP) tablet 0.2 mg  0.2 mg Oral QHS Gladstone Lighter, MD   0.2 mg at 09/27/16 2316  . docusate sodium (COLACE) capsule 100 mg  100 mg Oral BID Napoleon Form, RPH      . enoxaparin (LOVENOX) injection 40 mg  40 mg Subcutaneous Q12H Gladstone Lighter, MD   40 mg at 09/27/16 2314  . insulin aspart (novoLOG) injection 0-5 Units  0-5 Units Subcutaneous QHS Gladstone Lighter, MD      . insulin aspart (novoLOG) injection 0-9 Units  0-9 Units Subcutaneous TID WC Gladstone Lighter, MD   3 Units at 09/28/16 1820  . insulin glargine (LANTUS) injection 50 Units  50 Units Subcutaneous QHS Gladstone Lighter, MD   50 Units at 09/26/16 2207  . iron polysaccharides (NIFEREX) capsule 150 mg  150 mg Oral TID Gladstone Lighter, MD   150 mg at 09/28/16 1820  . loratadine (CLARITIN) tablet 10 mg  10 mg Oral Daily Gladstone Lighter, MD   10 mg at 09/27/16 0908  . metoCLOPramide (REGLAN) tablet 5 mg  5 mg Oral TID AC Theodoro Grist, MD   5 mg at 09/27/16 1824  . metoprolol succinate (TOPROL-XL) 24 hr tablet 25 mg  25 mg Oral BID Gladstone Lighter, MD   25 mg at 09/28/16 1140  . nystatin (MYCOSTATIN/NYSTOP) topical powder   Topical TID Gladstone Lighter, MD      . ondansetron [facility]) tablet 4 mg  4 mg Oral Q6H PRN Gladstone Lighter, MD   4 mg at 09/28/16 0243   Or  . ondansetron (ZOFRAN) injection 4 mg  4 mg Intravenous Q6H PRN Gladstone Lighter, MD   4 mg at 09/27/16 0950  . pantoprazole (PROTONIX) EC tablet 40 mg  40 mg Oral BID Gladstone Lighter, MD   40 mg at 09/27/16 2316  . potassium chloride SA (K-DUR,KLOR-CON) CR tablet 20 mEq  20 mEq Oral Daily Gladstone Lighter, MD   20 mEq at 09/27/16 0907  . [START ON 09/29/2016] senna (SENOKOT) tablet 8.6 mg  1 tablet Oral Daily Napoleon Form, RPH      . vitamin E capsule 1,000 Units  1,000 Units Oral Daily Gladstone Lighter, MD   1,000 Units at 09/27/16 0907    Musculoskeletal: Strength & Muscle Tone: decreased Gait & Station: unable to stand Patient leans: N/A  Psychiatric Specialty Exam: Physical Exam  Nursing note and vitals reviewed. Constitutional: She appears well-developed and well-nourished.  HENT:  Head: Normocephalic and atraumatic.  Eyes: Conjunctivae are normal. Pupils are equal, round, and reactive to light.  Neck: Normal range of motion.  Cardiovascular: Normal heart sounds.   Respiratory: Effort normal.  GI: Soft.  Musculoskeletal: Normal range of motion.  Neurological: She is alert.  Skin: Skin is warm and dry.  Psychiatric: Her affect is blunt. Her speech is delayed. She is slowed. Thought content is not paranoid and not delusional. She expresses impulsivity. She expresses no homicidal and no suicidal ideation. She exhibits abnormal recent memory and abnormal remote memory. She is inattentive.    Review of Systems  Constitutional: Negative.   HENT: Negative.   Eyes: Negative.   Respiratory: Negative.   Cardiovascular: Negative.   Gastrointestinal: Negative.   Musculoskeletal: Negative.   Skin: Negative.   Neurological: Negative.   Psychiatric/Behavioral: Negative for depression, hallucinations, memory loss, substance abuse and suicidal ideas. The patient is not nervous/anxious and does not have insomnia.     Blood pressure 136/61, pulse 95, temperature 98 F (36.7 C), resp. rate 18, height 5' 8"  (1.727 m), weight 131.5 kg (290 lb), SpO2 91  %.Body mass index is 44.09 kg/m.  General Appearance: Casual  Eye Contact:  Fair  Speech:  Normal Rate  Volume:  Decreased  Mood:  Euthymic  Affect:  Inappropriate  Thought Process:  Goal Directed  Orientation:  Full (Time, Place, and Person)  Thought Content:  Logical  Suicidal Thoughts:  No  Homicidal Thoughts:  No  Memory:  Immediate;   Fair Recent;   Poor Remote;   Fair  Judgement:  Impaired  Insight:  Lacking  Psychomotor Activity:  Decreased  Concentration:  Concentration: Fair  Recall:  [employer] of Knowledge:  Fair  Language:  Fair  Akathisia:  No  Handed:  Right  AIMS (if indicated):     Assets:  Communication Skills Desire for Improvement Financial Resources/Insurance Housing Resilience Social Support  ADL's:  Impaired  Cognition:  Impaired,  Mild  Sleep:        Treatment Plan Summary: Plan This is a 75 year old woman without past psychiatric history who apparently has been quite confused and has had some hallucinations at time during the hospital. By the patient's report she has no memory of this. On mental status she appears to still be a little bit confused. Her short-term memory was poor. She showed some irritability towards me that suggested still having some degree of confusion. Nevertheless there is no sign of depression or long-standing psychosis and by far the most likely thing is that this is still delirium from her infections that is resolving. Sr. feels like the patient is nearly back to her baseline. No indication to add any specific medication at this time. I will continue to follow up while she is in the hospital if needed.  Disposition: Patient does not meet criteria for psychiatric inpatient admission. Supportive therapy provided about ongoing stressors.  Alethia Berthold, MD 09/28/2016 6:32 PM

## 2016-09-28 NOTE — Progress Notes (Signed)
RN noted that catheter tubing was on the floor. RN inspected tubing and noticed that pt had manipulated the tubing and broke it in half near the syringe port for the balloon. RN removed the rest of the foley.

## 2016-09-28 NOTE — Progress Notes (Signed)
Pt has had 2 black stools and complained of nausea without abdominal pain. MD notified, orders placed for CBC. Will continue to monitor.

## 2016-09-28 NOTE — Care Management Important Message (Signed)
Important Message  Patient Details  Name: Carolyn Valdez MRN: [id number] Date of Birth: 06-04-1941   Medicare Important Message Given:  Yes    Beverly Sessions, RN 09/28/2016, 3:29 PM

## 2016-09-28 NOTE — Progress Notes (Signed)
Pt's sister, [name] was updated. Sister would like to be updated by the MD also today.

## 2016-09-28 NOTE — Progress Notes (Signed)
[facility] NAME: Carolyn Valdez    MR#:  [id number]  DATE OF BIRTH:  18-Nov-1940  SUBJECTIVE:  CHIEF COMPLAINT:   Chief Complaint  Patient presents with  . Dysuria   The patient is a 75 year old female with past medical history significant for history of multiple medical problems including diastolic CHF, arthritis, bilateral lower extremity edema, venous insufficiency, recurrent UTIs, hypertension, diabetes, who presents to the hospital due to altered mental status, confusion, urinary retention. Apparently, the patient had about 1 L of urine in the bladder, Foley catheter was placed and patient was admitted, urinalysis was remarkable for pyuria. Patient was initiated on vancomycin and meropenem intravenously. She was complaining of abdominal pain yesterday, underwent CT of abdomen, revealing proctitis, concerning for possible severe constipation related proctitis, per gastroenterologist . The patient is very uncomfortable today, complains of being held hostage, complains of abdominal pain, not able to elaborate, confused, agitated, and tearful, somewhat fearful.     Review of Systems  Gastrointestinal: Positive for abdominal pain and constipation.    VITAL SIGNS: Blood pressure 136/61, pulse 95, temperature 98 F (36.7 C), resp. rate 18, height 5\' 8"  (1.727 m), weight 131.5 kg (290 lb), SpO2 91 %.  PHYSICAL EXAMINATION:   GENERAL:  75 y.o.-year-old patient lying in the bein moderate distress, tearful, fearful, looking around as if someone is about to get her, is not willing to discuss her urinary issues due to someone possibly hearing her, although we are alone in the room  EYES: Pupils equal, round, reactive to light and accommodation. No scleral icterus. Extraocular muscles intact.  HEENT: Head atraumatic, normocephalic. Oropharynx and nasopharynx clear.  NECK:  Supple, no jugular venous distention. No thyroid enlargement, no  tenderness.  LUNGS: Normal breath sounds bilaterally, somewhat diminished at bases, no wheezing, rales,rhonchi or crepitation. No use of accessory muscles of respiration.  CARDIOVASCULAR: S1, S2 normal. No murmurs, rubs, or gallops.  ABDOMEN: Soft, no significant tenderness on palpation, nondistended. Bowel sounds present. No organomegaly or mass.  EXTREMITIES: No pedal edema, cyanosis, or clubbing. Right lower extremity erythema in the anterior shin area has resolved  NEUROLOGIC: Cranial nerves II through XII are intact. Muscle strength 5/5 in all extremities. Sensation intact. Gait not checked.  PSYCHIATRIC: The patient is alert , some anxious SKIN: No obvious rash, lesion, or ulcer.  Skin is scaly and thin , but no erythema in the right anterior shin  ORDERS/RESULTS REVIEWED:   CBC  Recent Labs Lab 09/24/16 1215 09/24/16 1835 09/25/16 0140 09/25/16 0940 09/25/16 1719 09/28/16 0521  WBC 7.8  --  6.4  --   --  6.1  HGB 12.8 12.2 11.9* 11.5* 11.3* 11.7*  HCT 40.0  --  36.1  --   --  34.9*  PLT 240  --  238  --   --  254  MCV 84.7  --  82.4  --   --  81.0  MCH 27.2  --  27.2  --   --  27.1  MCHC 32.1  --  33.0  --   --  33.5  RDW 17.2*  --  17.0*  --   --  17.1*   ------------------------------------------------------------------------------------------------------------------  Chemistries   Recent Labs Lab 09/24/16 1215 09/25/16 0140 09/27/16 0451  NA 139 139  --   K 4.7 4.7  --   CL 97* 98*  --   CO2 38* 35*  --   GLUCOSE 292* 222*  --  BUN 44* 43*  --   CREATININE 1.41* 1.32* 1.21*  CALCIUM 10.5* 10.0  --   AST 19  --   --   ALT 10*  --   --   ALKPHOS 63  --   --   BILITOT 0.6  --   --    ------------------------------------------------------------------------------------------------------------------ estimated creatinine clearance is 59.4 mL/min (by C-G formula based on SCr of 1.21 mg/dL  (H)). ------------------------------------------------------------------------------------------------------------------ No results for input(s): TSH, T4TOTAL, T3FREE, THYROIDAB in the last 72 hours.  Invalid input(s): FREET3  Cardiac Enzymes No results for input(s): CKMB, TROPONINI, MYOGLOBIN in the last 168 hours.  Invalid input(s): CK ------------------------------------------------------------------------------------------------------------------ Invalid input(s): POCBNP ---------------------------------------------------------------------------------------------------------------  RADIOLOGY: No results found.  EKG:  Orders placed or performed during the hospital encounter of 02/23/16  . ED EKG  . ED EKG  . EKG    ASSESSMENT AND PLAN:  Active Problems:   Altered mental status   Gastrointestinal hemorrhage   Diffuse abdominal pain #1 acute encephalopathy due to right lower extremity cellulitis, stercoral proctitis, suspected hypercapnia, improved on supportive therapy, IV fluids, antibiotic therapy, . However, patient's behavior remained very odd, getting psychiatrist involved for recommendations, questionable underlying psychiatric disorder. Ammonia level was normal. Holding benzodiazepines, decreased oxygen delivery to keep pulse oximeter at around 90-93% due to CO2 retention #2. Diffuse abdominal pain , suspected constipation related, CT scan of abdomen and pelvis revealed proctitis, suspected stercoral due to constipation, per gastroenterologist, now off meropenem, on oral Augmentin, continue regular diet, clinically improved #3. Sterile Pyuria, initially suspected recurrent urinary tract infection with urinary retention, urinary culture was negative, 1 blood culture negative so far, , urinary retention was thought to be due to severe constipation, probably catheter was removed, however, patient developed recurrent urinary retention requiring initiation of in and out  catheterization, starting Enablex #4. Renal insufficiency, chronic, CK D stage III, stable,following  closely #5. Anemia. With rehydration, no active bleeding, following  #6. Acute urinary retention, status post Foley catheter placement,   was thought to be related to constipation,  in and out catheterization as needed, restarting Enablex #7, constipation, continue MiraLAX, patient did have a good stool output.  #8 proctitis, suspected stercoral, continue MiraLAX to relieve constipation, improved clinically, patient was seen by gastroenterologist, who did not recommend flexible sigmoidoscopy at this time, but conservative management, initiate patient on Augmentin to finish antibiotic therapy.  #9. Generalized weakness. Patient refuses physical therapist evaluation    Management plans discussed with the patient, family and they are in agreement.   DRUG ALLERGIES:  Allergies  Allergen Reactions  . Sulfa Antibiotics Hives  . Biaxin [Clarithromycin] Hives  . Eggs Or Egg-Derived Products Other (See Comments)    Pt states that her allergy test said she was allergic to eggs.    . Influenza A (H1n1) Monoval Vac Other (See Comments)    Pt states that she was told by her MD not to get the influenza vaccine.    . Morphine Other (See Comments)    Reaction:  Dizziness and confusion   . Pyridium [Phenazopyridine Hcl] Other (See Comments)    Reaction:  Unknown   . Ceftriaxone Anxiety  . Latex Rash  . Prednisone Rash  . Tape Rash    CODE STATUS:     Code Status Orders        Start     Ordered   09/24/16 1731  Full code  Continuous     09/24/16 1730    Code Status History  Date Active Date Inactive Code Status Order ID Comments User Context   09/01/2016  2:17 AM 09/05/2016  4:28 PM Full Code [id number]  Harvie Bridge, DO Inpatient   08/09/2016  8:27 PM 08/12/2016  8:15 PM Full Code [id number]  Henreitta Leber, MD Inpatient   09/30/2015  2:23 PM 10/07/2015  5:37 PM Full Code  [id number]  Loletha Grayer, MD ED   09/05/2015  5:56 PM 09/09/2015  5:38 PM DNR [id number]  Loletha Grayer, MD ED    Advance Directive Documentation   [place] Most Recent Value  Type of Advance Directive  Living will  Pre-existing out of facility DNR order (yellow form or pink MOST form)  No data  "MOST" Form in Place?  No data      TOTAL TIME TAKING CARE OF THIS PATIENT: 40 minutes.    Theodoro Grist M.D on 09/28/2016 at 4:33 PM  Between 7am to 6pm - Pager - [phone]  After 6pm go to [email or web address] - password [email or web address]  [facility]  Office  [phone]  CC: Primary care physician; Leonel Ramsay, MD

## 2016-09-29 DIAGNOSIS — N390 Urinary tract infection, site not specified: Secondary | ICD-10-CM

## 2016-09-29 DIAGNOSIS — R0689 Other abnormalities of breathing: Secondary | ICD-10-CM

## 2016-09-29 DIAGNOSIS — K6289 Other specified diseases of anus and rectum: Secondary | ICD-10-CM

## 2016-09-29 DIAGNOSIS — L03115 Cellulitis of right lower limb: Secondary | ICD-10-CM

## 2016-09-29 DIAGNOSIS — R338 Other retention of urine: Secondary | ICD-10-CM

## 2016-09-29 DIAGNOSIS — R531 Weakness: Secondary | ICD-10-CM

## 2016-09-29 LAB — GLUCOSE, CAPILLARY
GLUCOSE-CAPILLARY: 132 mg/dL — AB (ref 65–99)
GLUCOSE-CAPILLARY: 255 mg/dL — AB (ref 65–99)
Glucose-Capillary: 120 mg/dL — ABNORMAL HIGH (ref 65–99)
Glucose-Capillary: 179 mg/dL — ABNORMAL HIGH (ref 65–99)

## 2016-09-29 LAB — CULTURE, BLOOD (ROUTINE X 2): Culture: NO GROWTH

## 2016-09-29 MED ORDER — METOPROLOL SUCCINATE ER 50 MG PO TB24: 50.0000 mg | ORAL_TABLET | Freq: Two times a day (BID) | ORAL | 5 refills | Status: DC

## 2016-09-29 MED ORDER — AMLODIPINE BESYLATE 5 MG PO TABS
5.0000 mg | ORAL_TABLET | Freq: Every day | ORAL | Status: DC
  Administered 2016-09-29 – 2016-10-01 (×2): 5 mg via ORAL
  Filled 2016-09-29 (×3): qty 1

## 2016-09-29 MED ORDER — METOCLOPRAMIDE HCL 5 MG PO TABS: 5.0000 mg | ORAL_TABLET | Freq: Three times a day (TID) | ORAL | 1 refills | Status: DC | PRN

## 2016-09-29 MED ORDER — GABAPENTIN 100 MG PO CAPS: 100.0000 mg | ORAL_CAPSULE | Freq: Three times a day (TID) | ORAL | 5 refills | Status: DC | PRN

## 2016-09-29 MED ORDER — SENNA 8.6 MG PO TABS: 1.0000 | ORAL_TABLET | Freq: Every day | ORAL | 0 refills | Status: DC

## 2016-09-29 MED ORDER — METOPROLOL SUCCINATE ER 50 MG PO TB24
50.0000 mg | ORAL_TABLET | Freq: Two times a day (BID) | ORAL | Status: DC
  Administered 2016-09-29 – 2016-10-26 (×51): 50 mg via ORAL
  Filled 2016-09-29 (×52): qty 1

## 2016-09-29 MED ORDER — AMOXICILLIN-POT CLAVULANATE 600-42.9 MG/5ML PO SUSR: 500.0000 mg | Freq: Two times a day (BID) | ORAL | 0 refills | Status: DC

## 2016-09-29 NOTE — Care Management (Signed)
Patient admitted for acute encephalopathy .  Patient lives in her home with 24 hours care giver.  Care giver private paid through [employer].  Patient open with [facility] for SN, Pt, and SW.  Patient has hoyer lift, BSC, and WC. Plan for patient to discharge today.  Sister has spoken with Sam from [place], and initially states that there would not be an aide to stay with the patient until Monday.  However upon further discussion it was revealed that there is an aide available through [employer], but it's "not one the patient likes".  I have informed the patient and sister that she will need to make arrangements for family to stay with patient until aide available on Monday, and will also provide list of [facility] services as an additional resource.   Resumption of home health orders to be placed.  Corene Cornea with [facility].  RNCM signing off

## 2016-09-29 NOTE — Discharge Summary (Signed)
[facility] [place] NAME: Carolyn Valdez    MR#:  [id number]  DATE OF BIRTH:  07/21/1941  DATE OF ADMISSION:  09/24/2016 ADMITTING PHYSICIAN: Gladstone Lighter, MD  DATE OF DISCHARGE: No discharge date for patient encounter.  PRIMARY CARE PHYSICIAN: [name], DAVID Mamie Nick, MD     ADMISSION DIAGNOSIS:  Lower urinary tract infectious disease [N39.0] Urinary retention [R33.9] Weakness [R53.1] Gastrointestinal hemorrhage, unspecified gastrointestinal hemorrhage type [K92.2]  DISCHARGE DIAGNOSIS:  Principal Problem:   Subacute delirium Active Problems:   Altered mental status   Acute urinary retention   UTI (urinary tract infection)   Diffuse abdominal pain   Cellulitis of leg, right   Proctitis   Hypercapnia   Gastrointestinal hemorrhage   Generalized weakness   SECONDARY DIAGNOSIS:   Past Medical History:  Diagnosis Date  . Anxiety   . Breast cancer ([place])    16 yrs ago  . CHF (congestive heart failure) ([place])   . Diabetes mellitus without complication ([place])   . DJD (degenerative joint disease)   . Dysuria   . H/O total knee replacement    right  . HTN (hypertension)   . Hypercholesteremia   . Kidney stone   . Obesity   . Recurrent urinary tract infection   . SVT (supraventricular tachycardia) ([place])   . Urinary incontinence   . Vaginal atrophy   . Yeast vaginitis     .pro HOSPITAL COURSE:  The patient is a 75 year old female with past medical history significant for history of multiple medical problems including diastolic CHF, arthritis, bilateral lower extremity edema, venous insufficiency, recurrent UTIs, hypertension, diabetes, who presented to the hospital due to altered mental status, confusion, urinary retention. Apparently, the patient had about 1 L of urine in the bladder, Foley catheter was placed and patient was admitted, urinalysis was remarkable for pyuria. Patient was initiated on vancomycin and meropenem  intravenously. She was complaining of abdominal pain, underwent CT of abdomen, revealing proctitis, concerning for possible constipation related proctitis, per gastroenterologist who saw patient in consultation. Urine cultures showed no growth as well as solitary blood culture. Patient was noted to have right lower extremity cellulitis, which was felt to be likely culprit of her acute delirium . With the conservative therapy, patient's condition improved. The patient was seen by psychiatrist, who felt that there were no signs of depression or long-standing psychosis, it was likely a delirium due to infection, which was resolving. Psychiatrist did not feel there was indication for any specific medication, but close follow-up as outpatient. Patient's Foley catheter was discontinued after antibiotic therapy, patient still was noted to have intermittent urinary retention, requiring in and out bladder catheterizations. Vesicare was restarted and patient was recommended to follow-up with urologist, Dr. Yves Dill  as outpatient for further recommendations. The medications were stopped, including high doses of gabapentin, Klonopin, Atarax. Discussion by problem: #1 acute encephalopathy due to right lower extremity cellulitis, stercoral proctitis, hypercapnia, improved on supportive therapy, stopping sedating medications, IV fluids, antibiotic therapy, . However, patient's behavior remained odd, seen by psychiatrist ., felt to be delirium due to above issues, resolving, follow-up with primary care physician was recommended. Ammonia level was normal. Holding benzodiazepines, decreased oxygen delivery to keep pulse oximeter at around 90-93% due to CO2 retention, now on room air with O2 sats of 92%.   #2. Diffuse abdominal pain , constipation related, CT scan of abdomen and pelvis revealed proctitis, stercoral due to constipation, per gastroenterologist, now off meropenem, continue  oral Augmentin, regular diet, stool softeners,  stimulants, clinically improved .  #3. Sterile pyuria, initially suspected recurrent urinary tract infection with urinary retention, urinary culture was negative, 1 blood culture negative so far, , urinary retention was thought to be due to severe constipation, catheter was removed, however, patient developed recurrent urinary retention requiring in and out catheterization, restarted Enablex, needs to follow up with Dr. Yves Dill, urology for further recommendations  #4. Renal insufficiency, chronic, CK D stage III, stable,following  closely #5. Anemia. With rehydration, no active bleeding, following  as outpatient  #6. Acute urinary retention, status post Foley catheter placement,   was thought to be related to constipation,  in and out catheterization as needed, restarted Enablex . Patient's urinary output improved with medications, follow-up with urologist as outpatient  #7, constipation, continue MiraLAX, patient did have a good stool output.  #8 proctitis, suspected stercoral, continue MiraLAX to relieve constipation, improved clinically, patient was seen by gastroenterologist, who did not recommend flexible sigmoidoscopy at this time, but conservative management.  #9. Generalized weakness. Patient had  physical therapist evaluation, recommended skilled nursing facility placement, however, is unable to follow commands, care, management and myself discussed this patient's sister, patient will be discharged home with home health services including nurse, social worker, physical therapy  DISCHARGE CONDITIONS:  Stable  CONSULTS OBTAINED:  Treatment Team:  Gonzella Lex, MD  DRUG ALLERGIES:   Allergies  Allergen Reactions  . Sulfa Antibiotics Hives  . Biaxin [Clarithromycin] Hives  . Eggs Or Egg-Derived Products Other (See Comments)    Pt states that her allergy test said she was allergic to eggs.    . Influenza A (H1n1) Monoval Vac Other (See Comments)    Pt states that she was told by her MD  not to get the influenza vaccine.    . Morphine Other (See Comments)    Reaction:  Dizziness and confusion   . Pyridium [Phenazopyridine Hcl] Other (See Comments)    Reaction:  Unknown   . Ceftriaxone Anxiety  . Latex Rash  . Prednisone Rash  . Tape Rash    DISCHARGE MEDICATIONS:   Current Discharge Medication List    START taking these medications   Details  amoxicillin-clavulanate (AUGMENTIN) 600-42.9 MG/5ML suspension Take 4.2 mLs (500 mg total) by mouth 2 (two) times daily. Qty: 125 mL, Refills: 0    metoCLOPramide (REGLAN) 5 MG tablet Take 1 tablet (5 mg total) by mouth every 8 (eight) hours as needed for nausea or vomiting. Qty: 90 tablet, Refills: 1    senna (SENOKOT) 8.6 MG TABS tablet Take 1 tablet (8.6 mg total) by mouth daily. Qty: 120 each, Refills: 0      CONTINUE these medications which have CHANGED   Details  gabapentin (NEURONTIN) 100 MG capsule Take 1 capsule (100 mg total) by mouth 3 (three) times daily as needed. Qty: 90 capsule, Refills: 5    metoprolol succinate (TOPROL-XL) 50 MG 24 hr tablet Take 1 tablet (50 mg total) by mouth 2 (two) times daily. Take with or immediately following a meal. Qty: 30 tablet, Refills: 5      CONTINUE these medications which have NOT CHANGED   Details  amLODipine (NORVASC) 2.5 MG tablet Take 1 tablet (2.5 mg total) by mouth daily. Qty: 30 tablet, Refills: 0    atorvastatin (LIPITOR) 20 MG tablet Take 20 mg by mouth at bedtime.    conjugated estrogens (PREMARIN) vaginal cream Place 1 Applicatorful vaginally 3 (three) times a week. Pt uses on  Monday, Wednesday, and Friday.    desmopressin (DDAVP) 0.2 MG tablet Take 1 tablet (0.2 mg total) by mouth at bedtime. Qty: 90 tablet, Refills: 3   Associated Diagnoses: Nocturia    docusate sodium (COLACE) 100 MG capsule Take 200 mg by mouth 2 (two) times daily.    fexofenadine (ALLEGRA) 180 MG tablet Take 180 mg by mouth daily.    !! insulin aspart (NOVOLOG) 100 UNIT/ML  injection Inject 0-20 Units into the skin 3 (three) times daily with meals. Qty: 10 mL, Refills: 11    !! insulin aspart (NOVOLOG) 100 UNIT/ML injection Inject 0-5 Units into the skin at bedtime. Qty: 10 mL, Refills: 11    insulin glargine (LANTUS) 100 UNIT/ML injection Inject 50 Units into the skin at bedtime.    iron polysaccharides (NIFEREX) 150 MG capsule Take 150 mg by mouth 3 (three) times daily.     nystatin (MYCOSTATIN/NYSTOP) powder Apply topically 3 (three) times daily. Qty: 15 g, Refills: 0    omeprazole (PRILOSEC) 40 MG capsule Take 40 mg by mouth 2 (two) times daily.    ondansetron (ZOFRAN ODT) 4 MG disintegrating tablet Take 1 tablet (4 mg total) by mouth every 8 (eight) hours as needed for nausea or vomiting. Qty: 20 tablet, Refills: 0    pioglitazone (ACTOS) 30 MG tablet Take 30 mg by mouth daily.    VESICARE 10 MG tablet Take 1 tablet (10 mg total) by mouth daily. Qty: 90 tablet, Refills: 3   Associated Diagnoses: Nocturia    vitamin E 1000 UNIT capsule Take 1,000 Units by mouth daily.     !! - Potential duplicate medications found. Please discuss with provider.    STOP taking these medications     clonazePAM (KLONOPIN) 1 MG tablet      furosemide (LASIX) 20 MG tablet      hydrOXYzine (ATARAX/VISTARIL) 25 MG tablet      potassium chloride SA (K-DUR,KLOR-CON) 20 MEQ tablet          DISCHARGE INSTRUCTIONS:    Patient is to follow-up with primary care physician, urologist as outpatient  If you experience worsening of your admission symptoms, develop shortness of breath, life threatening emergency, suicidal or homicidal thoughts you must seek medical attention immediately by calling 911 or calling your MD immediately  if symptoms less severe.  You Must read complete instructions/literature along with all the possible adverse reactions/side effects for all the Medicines you take and that have been prescribed to you. Take any new Medicines after you have  completely understood and accept all the possible adverse reactions/side effects.   Please note  You were cared for by a hospitalist during your hospital stay. If you have any questions about your discharge medications or the care you received while you were in the hospital after you are discharged, you can call the unit and asked to speak with the hospitalist on call if the hospitalist that took care of you is not available. Once you are discharged, your primary care physician will handle any further medical issues. Please note that NO REFILLS for any discharge medications will be authorized once you are discharged, as it is imperative that you return to your primary care physician (or establish a relationship with a primary care physician if you do not have one) for your aftercare needs so that they can reassess your need for medications and monitor your lab values.    Today   CHIEF COMPLAINT:   Chief Complaint  Patient presents with  .  Dysuria    HISTORY OF PRESENT ILLNESS:  Mikayah Joy  is a 76 y.o. female with a known history of multiple medical problems including diastolic CHF, arthritis, bilateral lower extremity edema, venous insufficiency, recurrent UTIs, hypertension, diabetes, who presented to the hospital due to altered mental status, confusion, urinary retention. Apparently, the patient had about 1 L of urine in the bladder, Foley catheter was placed and patient was admitted, urinalysis was remarkable for pyuria. Patient was initiated on vancomycin and meropenem intravenously. She was complaining of abdominal pain, underwent CT of abdomen, revealing proctitis, concerning for possible constipation related proctitis, per gastroenterologist who saw patient in consultation. Urine cultures showed no growth as well as solitary blood culture. Patient was noted to have right lower extremity cellulitis, which was felt to be likely culprit of her acute delirium . With the conservative therapy,  patient's condition improved. The patient was seen by psychiatrist, who felt that there were no signs of depression or long-standing psychosis, it was likely a delirium due to infection, which was resolving. Psychiatrist did not feel there was indication for any specific medication, but close follow-up as outpatient. Patient's Foley catheter was discontinued after antibiotic therapy, patient still was noted to have intermittent urinary retention, requiring in and out bladder catheterizations. Vesicare was restarted and patient was recommended to follow-up with urologist, Dr. Yves Dill  as outpatient for further recommendations. The medications were stopped, including high doses of gabapentin, Klonopin, Atarax. Discussion by problem: #1 acute encephalopathy due to right lower extremity cellulitis, stercoral proctitis, hypercapnia, improved on supportive therapy, stopping sedating medications, IV fluids, antibiotic therapy, . However, patient's behavior remained odd, seen by psychiatrist ., felt to be delirium due to above issues, resolving, follow-up with primary care physician was recommended. Ammonia level was normal. Holding benzodiazepines, decreased oxygen delivery to keep pulse oximeter at around 90-93% due to CO2 retention, now on room air with O2 sats of 92%.   #2. Diffuse abdominal pain , constipation related, CT scan of abdomen and pelvis revealed proctitis, stercoral due to constipation, per gastroenterologist, now off meropenem, continue oral Augmentin, regular diet, stool softeners, stimulants, clinically improved .  #3. Sterile pyuria, initially suspected recurrent urinary tract infection with urinary retention, urinary culture was negative, 1 blood culture negative so far, , urinary retention was thought to be due to severe constipation, catheter was removed, however, patient developed recurrent urinary retention requiring in and out catheterization, restarted Enablex, needs to follow up with Dr.  Yves Dill, urology for further recommendations  #4. Renal insufficiency, chronic, CK D stage III, stable,following  closely #5. Anemia. With rehydration, no active bleeding, following  as outpatient  #6. Acute urinary retention, status post Foley catheter placement,   was thought to be related to constipation,  in and out catheterization as needed, restarted Enablex . Patient's urinary output improved with medications, follow-up with urologist as outpatient  #7, constipation, continue MiraLAX, patient did have a good stool output.  #8 proctitis, suspected stercoral, continue MiraLAX to relieve constipation, improved clinically, patient was seen by gastroenterologist, who did not recommend flexible sigmoidoscopy at this time, but conservative management.  #9. Generalized weakness. Patient had  physical therapist evaluation, recommended skilled nursing facility placement, however, is unable to follow commands, care, management and myself discussed this patient's sister, patient will be discharged home with home health services including nurse, social worker, physical therapy    VITAL SIGNS:  Blood pressure (!) 159/71, pulse 96, temperature 98.9 F (37.2 C), temperature source Oral, resp. rate 17, height  5\' 8"  (1.727 m), weight 131.5 kg (290 lb), SpO2 92 %.  I/O:   Intake/Output Summary (Last 24 hours) at 09/29/16 1713 Last data filed at 09/28/16 2153  Gross per 24 hour  Intake                0 ml  Output              250 ml  Net             -250 ml    PHYSICAL EXAMINATION:  GENERAL:  75 y.o.-year-old patient lying in the bed with no acute distress.  EYES: Pupils equal, round, reactive to light and accommodation. No scleral icterus. Extraocular muscles intact.  HEENT: Head atraumatic, normocephalic. Oropharynx and nasopharynx clear.  NECK:  Supple, no jugular venous distention. No thyroid enlargement, no tenderness.  LUNGS: Normal breath sounds bilaterally, no wheezing, rales,rhonchi or  crepitation. No use of accessory muscles of respiration.  CARDIOVASCULAR: S1, S2 normal. No murmurs, rubs, or gallops.  ABDOMEN: Soft, non-tender, non-distended. Bowel sounds present. No organomegaly or mass.  EXTREMITIES: No pedal edema, cyanosis, or clubbing.  NEUROLOGIC: Cranial nerves II through XII are intact. Muscle strength 5/5 in all extremities. Sensation intact. Gait not checked.  PSYCHIATRIC: The patient is alert and oriented x 3.  SKIN: No obvious rash, lesion, or ulcer.   DATA REVIEW:   CBC  Recent Labs Lab 09/28/16 0521  WBC 6.1  HGB 11.7*  HCT 34.9*  PLT 254    Chemistries   Recent Labs Lab 09/24/16 1215 09/25/16 0140 09/27/16 0451  NA 139 139  --   K 4.7 4.7  --   CL 97* 98*  --   CO2 38* 35*  --   GLUCOSE 292* 222*  --   BUN 44* 43*  --   CREATININE 1.41* 1.32* 1.21*  CALCIUM 10.5* 10.0  --   AST 19  --   --   ALT 10*  --   --   ALKPHOS 63  --   --   BILITOT 0.6  --   --     Cardiac Enzymes No results for input(s): TROPONINI in the last 168 hours.  Microbiology Results  Results for orders placed or performed during the hospital encounter of 09/24/16  Urine culture     Status: None   Collection Time: 09/24/16 12:15 PM  Result Value Ref Range Status   Specimen Description URINE, RANDOM  Final   Special Requests NONE  Final   Culture NO GROWTH Performed at [facility]   Final   Report Status 09/26/2016 FINAL  Final  CULTURE, BLOOD (ROUTINE X 2) w Reflex to ID Panel     Status: None   Collection Time: 09/24/16  6:35 PM  Result Value Ref Range Status   Specimen Description BLOOD  RIGHT AC  Final   Special Requests   Final    BOTTLES DRAWN AEROBIC AND ANAEROBIC AER 8ML ANA 9ML   Culture NO GROWTH 5 DAYS  Final   Report Status 09/29/2016 FINAL  Final    RADIOLOGY:  No results found.  EKG:   Orders placed or performed during the hospital encounter of 02/23/16  . ED EKG  . ED EKG  . EKG      Management plans discussed with  the patient, family and they are in agreement.  CODE STATUS:     Code Status Orders        Start  Ordered   09/24/16 1731  Full code  Continuous     09/24/16 1730    Code Status History    Date Active Date Inactive Code Status Order ID Comments User Context   09/01/2016  2:17 AM 09/05/2016  4:28 PM Full Code [id number]  Harvie Bridge, DO Inpatient   08/09/2016  8:27 PM 08/12/2016  8:15 PM Full Code [id number]  Henreitta Leber, MD Inpatient   09/30/2015  2:23 PM 10/07/2015  5:37 PM Full Code [id number]  Loletha Grayer, MD ED   09/05/2015  5:56 PM 09/09/2015  5:38 PM DNR [id number]  Loletha Grayer, MD ED    Advance Directive Documentation   [place] Most Recent Value  Type of Advance Directive  Living will  Pre-existing out of facility DNR order (yellow form or pink MOST form)  No data  "MOST" Form in Place?  No data      TOTAL TIME TAKING CARE OF THIS PATIENT: 45  minutes.  Discussed this patient's sister,  all questions were answered, patient's sister voiced understanding, discussed with care management.   Theodoro Grist M.D on 09/29/2016 at 5:13 PM  Between 7am to 6pm - Pager - [phone]  After 6pm go to [email or web address] - password [email or web address] [facility]  [place] [facility]  Office  [phone]  CC: Primary care physician; Leonel Ramsay, MD

## 2016-09-29 NOTE — Plan of Care (Signed)
Problem: Education: Goal: Knowledge of [facility] Education information/materials will improve Outcome: Not Progressing Patient is confused and non compliant.  Problem: Activity: Goal: Risk for activity intolerance will decrease Outcome: Not Progressing Patient is confused.

## 2016-09-29 NOTE — Progress Notes (Signed)
Patient has been discharged however, when sister came to see patient,  she thinks that patient mental status is not back to baseline,that mental status is worst tonight than last time she saw the patient. In addition, her mobility is not back to baseline as well. The patient currently needs two-three people heavy assist with diaper changed. The sister is not capable of taking care of the patient  at home tonight and she thinks it's not safe for the patient to go back home tonight. With all this concern, sister is not comfortable of patient's discharge and refused to take care of patient at home tonight.  I called Dr. Belenda Cruise and relayed to her the family's concern. Dr. Havery Moros instructed to cancel discharge order and this issue will be addressed tomorrow.

## 2016-09-30 ENCOUNTER — Inpatient Hospital Stay: Payer: Medicare Other

## 2016-09-30 LAB — CREATININE, SERUM
Creatinine, Ser: 0.9 mg/dL (ref 0.44–1.00)
GFR calc Af Amer: >60 mL/min (ref >60)

## 2016-09-30 LAB — GLUCOSE, CAPILLARY
GLUCOSE-CAPILLARY: 127 mg/dL — AB (ref 65–99)
Glucose-Capillary: 121 mg/dL — ABNORMAL HIGH (ref 65–99)
Glucose-Capillary: 135 mg/dL — ABNORMAL HIGH (ref 65–99)
Glucose-Capillary: 132 mg/dL — ABNORMAL HIGH (ref 65–99)

## 2016-09-30 MED ORDER — HALOPERIDOL 0.5 MG PO TABS
1.0000 mg | ORAL_TABLET | Freq: Three times a day (TID) | ORAL | Status: DC
  Administered 2016-09-30 – 2016-10-01 (×3): 1 mg via ORAL
  Filled 2016-09-30 (×3): qty 2

## 2016-09-30 MED ORDER — LORAZEPAM 2 MG/ML IJ SOLN: 1.0000 mg | Freq: Once | INTRAMUSCULAR | Status: DC

## 2016-09-30 MED ORDER — LORAZEPAM 2 MG/ML IJ SOLN
1.0000 mg | Freq: Once | INTRAMUSCULAR | Status: AC
Start: 2016-09-30 — End: 2016-09-30
  Administered 2016-09-30: 1 mg via INTRAMUSCULAR
  Filled 2016-09-30: qty 1

## 2016-09-30 NOTE — Progress Notes (Signed)
Physical Therapy Treatment and Discharge Patient Details Name: Carolyn Valdez MRN: [id number] DOB: 1941-02-22 Today's Date: 09/30/2016    History of Present Illness Pt is a 75 y/o F who presents to the hospital due to AMS, confusion, urinary retention.  There is concern for possible severe constipation related proctitis.  Admiting dx: acute encephalopathy due to RLE cellulitis, stercoral proctitis.  Pt's PMH includes CHF, BLE edema, venous insufficiency, recurrent UTIs, obesity, SVT, TKA,  per pt L sciatica.    PT Comments    Pt was alert and oriented x1 and was not following commands for majority of session. Pt's sister and caregiver were both present during session.  This session was focused on educating caregiver on AAROM exercises and positioning schedule to be implemented at home.  Caregiver performed RUE/LE AAROM as this PT demonstrated on LUE/LE.  All questions were answered and caregiver and sister verbalized understanding of education provided.  Lift utilized at home for OOB although pt spends most of her time in bed.  All family/caregiver education has been completed and there are no further skilled PT needs.  PT will sign off at this time.   Follow Up Recommendations  SNF     Equipment Recommendations  Other (comment) (TBD at next venue of care,pt has lift and hospital bed)    Recommendations for Other Services       Precautions / Restrictions Precautions Precautions: Fall Restrictions Weight Bearing Restrictions: No    Mobility  Bed Mobility               General bed mobility comments: Attempted rolling in bed but pt unable to follow commands and required total +2 assist for minimal weight shift so pillow could be positioned on L side.  Transfers                 General transfer comment: unable, pt used lift at baseline  Ambulation/Gait                 Stairs            Wheelchair Mobility    Modified Rankin (Stroke Patients Only)        Balance                                    Cognition Arousal/Alertness: Awake/alert Behavior During Therapy: Flat affect Overall Cognitive Status: Impaired/Different from baseline Area of Impairment: Orientation;Attention;Memory;Following commands;Safety/judgement;Awareness;Problem solving Orientation Level: Disoriented to;Place;Time;Situation Current Attention Level: Focused Memory: Decreased short-term memory Following Commands: Follows one step commands inconsistently (very rarely following commands) Safety/Judgement: Decreased awareness of deficits;Decreased awareness of safety Awareness: Intellectual Problem Solving: Slow processing;Decreased initiation;Requires verbal cues;Requires tactile cues General Comments: Pt minimally verbal and not following cues for majority of session.      Exercises General Exercises - Upper Extremity Shoulder Flexion: Both;10 reps;Supine;AAROM Elbow Flexion: AAROM;Both;10 reps;Supine Elbow Extension: AAROM;Both;10 reps;Supine General Exercises - Lower Extremity Ankle Circles/Pumps: Both;10 reps;Supine;AAROM Heel Slides: AAROM;Both;10 reps;Supine Hip ABduction/ADduction: AAROM;Both;10 reps;Supine Straight Leg Raises: AAROM;Both;10 reps;Supine    General Comments General comments (skin integrity, edema, etc.): Pt's sister and caregiver were both present during session.  This session was focused on educating caregiver on AAROM exercises and positioning schedule to be implemented at home.  Caregiver performed RUE/LE AAROM as this PT demonstrated on LUE/LE.  All questions were answered and caregiver and sister verbalized understanding of education provided.      Pertinent  Vitals/Pain Pain Assessment: Faces Faces Pain Scale: Hurts a little bit Pain Location: RUE at end of set of shoulder F AAROM Pain Descriptors / Indicators: Grimacing Pain Intervention(s): Limited activity within patient's tolerance;Monitored during  session;Repositioned    Home Living                      Prior Function            PT Goals (current goals can now be found in the care plan section) Acute Rehab PT Goals Patient Stated Goal: none stated PT Goal Formulation: All assessment and education complete, DC therapy Progress towards PT goals: Goals met/education completed, patient discharged from PT    Frequency     (family education complete, pt appropriate for d/c)      PT Plan      Co-evaluation             End of Session   Activity Tolerance: Patient limited by pain;Patient limited by fatigue Patient left: in bed;with call bell/phone within reach;with bed alarm set;with family/visitor present     Time: 1856-3149 PT Time Calculation (min) (ACUTE ONLY): 23 min  Charges:  $Therapeutic Exercise: 23-37 mins                    G Codes:       Collie Siad PT, DPT 09/30/2016, 2:21 PM

## 2016-09-30 NOTE — Progress Notes (Signed)
[facility] [place] NAME: Tehilla Coffel    MR#:  [id number]  DATE OF BIRTH:  07-10-1941  SUBJECTIVE:  CHIEF COMPLAINT:   Chief Complaint  Patient presents with  . Dysuria   The patient is a 75 year old female with past medical history significant for history of multiple medical problems including diastolic CHF, arthritis, bilateral lower extremity edema, venous insufficiency, recurrent UTIs, hypertension, diabetes, who presents to the hospital due to altered mental status, confusion, urinary retention. Apparently, the patient had about 1 L of urine in the bladder, Foley catheter was placed and patient was admitted, urinalysis was remarkable for pyuria. Patient was initiated on vancomycin and meropenem intravenously. She was complaining of abdominal pain yesterday, underwent CT of abdomen, revealing proctitis, concerning for possible severe constipation related proctitis, per gastroenterologist . The patient was about to be discharged yesterday, however, was noted to be confused by family and discharge was refused. Patient feels comfortable today, electroencephalogram was ordered, however, patient is disoriented, confused more than usual, refuses to take medications, holds food/pills in the mouth, does not swallow, does not speak. Essentially nonverbal   Review of Systems  Unable to perform ROS: Mental status change  Gastrointestinal: Negative for abdominal pain and constipation.    VITAL SIGNS: Blood pressure (!) 131/55, pulse 79, temperature 98.3 F (36.8 C), temperature source Oral, resp. rate 17, height 5\' 8"  (1.727 m), weight 131.5 kg (290 lb), SpO2 95 %.  PHYSICAL EXAMINATION:   GENERAL:  75 y.o.-year-old patient sitting in the bed, comfortable, mouth is closed shut, nonverbal, answers questions shaking  or nodding her head, appropriately, does not swallow pills, but she keeps in the mouth, not able to show tongue or open her mouth   EYES: Pupils equal, round, reactive to light and accommodation. No scleral icterus. Extraocular muscles intact.  HEENT: Head atraumatic, normocephalic. Oropharynx and nasopharynx clear.  NECK:  Supple, no jugular venous distention. No thyroid enlargement, no tenderness.  LUNGS: Normal breath sounds bilaterally, somewhat diminished at bases, no wheezing, rales,rhonchi or crepitation. No use of accessory muscles of respiration.  CARDIOVASCULAR: S1, S2 normal. No murmurs, rubs, or gallops.  ABDOMEN: Soft, no significant tenderness on palpation, mildly uncomfortable on palpation, nondistended. Bowel sounds present. No organomegaly or mass.  EXTREMITIES: No pedal edema, cyanosis, or clubbing. Right lower extremity erythema in the anterior shin area has resolved  NEUROLOGIC: Cranial nerves II through XII are intact. Muscle strength 5/5 in all extremities. Sensation intact. Gait not checked.  PSYCHIATRIC: The patient is alert , nonverbal, able to communicate by nodding or shaking her head, appropriately SKIN: No obvious rash, lesion, or ulcer.  Skin is scaly and thin , but no erythema in the right anterior shin  ORDERS/RESULTS REVIEWED:   CBC  Recent Labs Lab 09/24/16 1215 09/24/16 1835 09/25/16 0140 09/25/16 0940 09/25/16 1719 09/28/16 0521  WBC 7.8  --  6.4  --   --  6.1  HGB 12.8 12.2 11.9* 11.5* 11.3* 11.7*  HCT 40.0  --  36.1  --   --  34.9*  PLT 240  --  238  --   --  254  MCV 84.7  --  82.4  --   --  81.0  MCH 27.2  --  27.2  --   --  27.1  MCHC 32.1  --  33.0  --   --  33.5  RDW 17.2*  --  17.0*  --   --  17.1*   ------------------------------------------------------------------------------------------------------------------  Chemistries   Recent Labs Lab 09/24/16 1215 09/25/16 0140 09/27/16 0451 09/30/16 0453  NA 139 139  --   --   K 4.7 4.7  --   --   CL 97* 98*  --   --   CO2 38* 35*  --   --   GLUCOSE 292* 222*  --   --   BUN 44* 43*  --   --   CREATININE  1.41* 1.32* 1.21* 0.90  CALCIUM 10.5* 10.0  --   --   AST 19  --   --   --   ALT 10*  --   --   --   ALKPHOS 63  --   --   --   BILITOT 0.6  --   --   --    ------------------------------------------------------------------------------------------------------------------ estimated creatinine clearance is 79.9 mL/min (by C-G formula based on SCr of 0.9 mg/dL). ------------------------------------------------------------------------------------------------------------------ No results for input(s): TSH, T4TOTAL, T3FREE, THYROIDAB in the last 72 hours.  Invalid input(s): FREET3  Cardiac Enzymes No results for input(s): CKMB, TROPONINI, MYOGLOBIN in the last 168 hours.  Invalid input(s): CK ------------------------------------------------------------------------------------------------------------------ Invalid input(s): POCBNP ---------------------------------------------------------------------------------------------------------------  RADIOLOGY: Mr Brain Wo Contrast  Result Date: 09/30/2016 CLINICAL DATA:  Confusion EXAM: MRI HEAD WITHOUT CONTRAST TECHNIQUE: Multiplanar, multiecho pulse sequences of the brain and surrounding structures were obtained without intravenous contrast. COMPARISON:  CT head 09/24/2016 FINDINGS: Brain: Image quality degraded by motion Negative for acute infarct. Mild chronic microvascular ischemic change in the white matter. Negative for hemorrhage or mass. Ventricle size within normal limits. Vascular: Normal arterial flow voids. Skull and upper cervical spine: Negative Sinuses/Orbits: Negative Other: None IMPRESSION: Image quality degraded by motion.  No acute abnormality. Electronically Signed   By: Franchot Gallo M.D.   On: 09/30/2016 11:44    EKG:  Orders placed or performed during the hospital encounter of 02/23/16  . ED EKG  . ED EKG  . EKG    ASSESSMENT AND PLAN:  Principal Problem:   Subacute delirium Active Problems:   Altered mental status    Acute urinary retention   UTI (urinary tract infection)   Diffuse abdominal pain   Cellulitis of leg, right   Proctitis   Hypercapnia   Gastrointestinal hemorrhage   Generalized weakness #1 . Recurrent Delirium, initially thought to be due to right lower extremity cellulitis, stercoral proctitis, hypercapnia, improved on supportive therapy, IV fluids, antibiotic therapy to the point that patient was about to be discharged yesterday, however, her behavior was noted to be atypical for her later in the day and patient's sister refused discharge.  Ammonia level was normal.Decreased oxygen delivery to keep pulse oximeter at around 90-93% due to CO2 retention. Psychiatrist did not recommend any medications. Questionable benzodiazepine withdrawal. Getting electroencephalogram, neurologist input.  #2. Diffuse abdominal pain , suspected constipation related, CT scan of abdomen and pelvis revealed proctitis, suspected stercoral due to constipation, per gastroenterologist, now off meropenem, on oral Augmentin, continue regular diet, clinically improved #3. Sterile Pyuria, initially suspected recurrent urinary tract infection with urinary retention, urinary culture was negative, 1 blood culture negative so far, , urinary retention was thought to be due to severe constipation, catheter was removed, however, patient developed recurrent urinary retention requiring initiation of in and out catheterization, restarting Enablex, patient is able to void, following bladder scan intermittently #4. Renal insufficiency, chronic, CK D stage III, stable,following  closely #5. Anemia. With rehydration, no active bleeding, following  #6. Acute urinary retention, status post Foley catheter  placement,   was thought to be related to constipation,  in and out catheterization as needed to be continued, restarted Enablex, patient is able to void, repeat bladder scan #7, constipation, continue MiraLAX, patient is having good stool  output.  #8 proctitis, suspected stercoral, continue MiraLAX to relieve constipation, improved clinically, patient was seen by gastroenterologist, who did not recommend flexible sigmoidoscopy at this time, but conservative management, initiated patient on Augmentin to finish antibiotic therapy.  #9. Generalized weakness. Patient refused physical therapist intervention in the past, she is not a candidate for skilled nursing facility placement for Management    Management plans discussed with the patient, family and they are in agreement.   DRUG ALLERGIES:  Allergies  Allergen Reactions  . Sulfa Antibiotics Hives  . Biaxin [Clarithromycin] Hives  . Eggs Or Egg-Derived Products Other (See Comments)    Pt states that her allergy test said she was allergic to eggs.    . Influenza A (H1n1) Monoval Vac Other (See Comments)    Pt states that she was told by her MD not to get the influenza vaccine.    . Morphine Other (See Comments)    Reaction:  Dizziness and confusion   . Pyridium [Phenazopyridine Hcl] Other (See Comments)    Reaction:  Unknown   . Ceftriaxone Anxiety  . Latex Rash  . Prednisone Rash  . Tape Rash    CODE STATUS:     Code Status Orders        Start     Ordered   09/24/16 1731  Full code  Continuous     09/24/16 1730    Code Status History    Date Active Date Inactive Code Status Order ID Comments User Context   09/01/2016  2:17 AM 09/05/2016  4:28 PM Full Code [id number]  Harvie Bridge, DO Inpatient   08/09/2016  8:27 PM 08/12/2016  8:15 PM Full Code [id number]  Henreitta Leber, MD Inpatient   09/30/2015  2:23 PM 10/07/2015  5:37 PM Full Code [id number]  Loletha Grayer, MD ED   09/05/2015  5:56 PM 09/09/2015  5:38 PM DNR [id number]  Loletha Grayer, MD ED    Advance Directive Documentation   [place] Most Recent Value  Type of Advance Directive  Living will  Pre-existing out of facility DNR order (yellow form or pink MOST form)  No data  "MOST" Form in  Place?  No data      TOTAL TIME TAKING CARE OF THIS PATIENT: 40 minutes.  Discussed this patient's sister extensively, all questions were answered, she voiced understanding and appreciation  Tylyn Stankovich M.D on 09/30/2016 at 3:54 PM  Between 7am to 6pm - Pager - [phone]  After 6pm go to [email or web address] - password [email or web address] [facility]  [facility]  Office  [phone]  CC: Primary care physician; Leonel Ramsay, MD

## 2016-09-30 NOTE — Clinical Social Work Note (Signed)
Clinical Social Work Assessment  Patient Details  Name: Carolyn Valdez MRN: [id number] Date of Birth: 08/29/1941  Date of referral:  09/30/16               Reason for consult:  Facility Placement                Permission sought to share information with:   (Patient unable to communicate this afternoon and is very confused) Permission granted to share information::     Name::        Agency::     Relationship::     Contact Information:     Housing/Transportation Living arrangements for the past 2 months:  Single Family Home Source of Information:   (Sister: Ms. Huey Bienenstock) Patient Interpreter Needed:  None Criminal Activity/Legal Involvement Pertinent to Current Situation/Hospitalization:  No - Comment as needed Significant Relationships:  Siblings Lives with:  Self Do you feel safe going back to the place where you live?    Need for family participation in patient care:  Yes (Comment)  Care giving concerns:  Patient lives alone with 24 hour caregivers in place.  Social Worker assessment / plan:  CSW met with patient's sister in patient's room this afternoon and discussed the fact that patient is not able to go to a facility for rehab as she is not able to follow directions and participate at this time. CSW reminded patient's sister that patient was discharged from her previous rehab facility due to inability to participate in rehab and inability to follow commands. Patient was not improving and thus patient was discharged home. Patient's sister has had 24/7 care arranged for patient in the home setting for several months and will be able to continue with this care. CSW reminded patient's sister that patient's ability to not be able to participate is not likely to change and that unless she is able to pay out of pocket for patient to go to a facility, patient will return home at discharge with previous care in place. Patient's baseline is confused and unable to ambulate. Caregivers have been using  a hoyer lift in the home for some time now. Once medically cleared, patient will return home via ambulance.  Employment status:  Retired Nurse, adult PT Recommendations:  [place] / Referral to community resources:     Patient/Family's Response to care:  Patient's sister expressed appreciation for CSW visit.   Patient/Family's Understanding of and Emotional Response to Diagnosis, Current Treatment, and Prognosis:  Patient's sister is concerned about her sister's mental status worsening and is wanting to find out why.   Emotional Assessment Appearance:  Appears stated age Attitude/Demeanor/Rapport:  Lethargic Affect (typically observed):  Quiet Orientation:  Oriented to Self Alcohol / Substance use:  Not Applicable Psych involvement (Current and /or in the community):  Yes (Comment)  Discharge Needs  Concerns to be addressed:  Care Coordination Readmission within the last 30 days:  No Current discharge risk:  None Barriers to Discharge:  No Barriers Identified   Shela Leff, LCSW 09/30/2016, 2:52 PM

## 2016-10-01 LAB — BLOOD GAS, ARTERIAL
Acid-Base Excess: 8.7 mmol/L — ABNORMAL HIGH (ref 0.0–2.0)
BICARBONATE: 31.5 mmol/L — AB (ref 20.0–28.0)
FIO2: 0.21
O2 SAT: 94.3 %
PATIENT TEMPERATURE: 37
PCO2 ART: 36 mmHg (ref 32.0–48.0)
PO2 ART: 62 mmHg — AB (ref 83.0–108.0)
pH, Arterial: 7.55 — ABNORMAL HIGH (ref 7.350–7.450)

## 2016-10-01 LAB — VITAMIN B12: VITAMIN B 12: 536 pg/mL (ref 180–914)

## 2016-10-01 LAB — T4, FREE: Free T4: 1.33 ng/dL — ABNORMAL HIGH (ref 0.61–1.12)

## 2016-10-01 LAB — GLUCOSE, CAPILLARY
GLUCOSE-CAPILLARY: 117 mg/dL — AB (ref 65–99)
GLUCOSE-CAPILLARY: 105 mg/dL — AB (ref 65–99)
GLUCOSE-CAPILLARY: 149 mg/dL — AB (ref 65–99)
Glucose-Capillary: 150 mg/dL — ABNORMAL HIGH (ref 65–99)

## 2016-10-01 LAB — FOLATE: Folate: 42 ng/mL (ref >5.9)

## 2016-10-01 LAB — TSH: TSH: 5.008 u[IU]/mL — AB (ref 0.350–4.500)

## 2016-10-01 LAB — C-REACTIVE PROTEIN

## 2016-10-01 LAB — SEDIMENTATION RATE: SED RATE: 26 mm/h (ref 0–30)

## 2016-10-01 MED ORDER — POLYVINYL ALCOHOL 1.4 % OP SOLN
1.0000 [drp] | OPHTHALMIC | Status: DC | PRN
  Filled 2016-10-01: qty 15

## 2016-10-01 MED ORDER — CLONAZEPAM 1 MG PO TABS
1.0000 mg | ORAL_TABLET | Freq: Two times a day (BID) | ORAL | Status: DC
  Administered 2016-10-01 – 2016-10-04 (×6): 1 mg via ORAL
  Filled 2016-10-01 (×6): qty 1

## 2016-10-01 MED ORDER — AMLODIPINE BESYLATE 10 MG PO TABS
10.0000 mg | ORAL_TABLET | Freq: Every day | ORAL | Status: DC
  Administered 2016-10-01 – 2016-10-26 (×24): 10 mg via ORAL
  Filled 2016-10-01 (×23): qty 1

## 2016-10-01 MED ORDER — AMLODIPINE BESYLATE 5 MG PO TABS
5.0000 mg | ORAL_TABLET | Freq: Once | ORAL | Status: DC
  Filled 2016-10-01: qty 1

## 2016-10-01 NOTE — Progress Notes (Signed)
[facility] [place] NAME: Carolyn Valdez    MR#:  [id number]  DATE OF BIRTH:  08/27/1941  SUBJECTIVE:  CHIEF COMPLAINT:   Chief Complaint  Patient presents with  . Dysuria   The patient is a 75 year old female with past medical history significant for history of multiple medical problems including diastolic CHF, arthritis, bilateral lower extremity edema, venous insufficiency, recurrent UTIs, hypertension, diabetes, who presents to the hospital due to altered mental status, confusion, urinary retention. Apparently, the patient had about 1 L of urine in the bladder, Foley catheter was placed and patient was admitted, urinalysis was remarkable for pyuria. Patient was initiated on vancomycin and meropenem intravenously. She was complaining of abdominal pain yesterday, underwent CT of abdomen, revealing proctitis, concerning for possible severe constipation related proctitis, per gastroenterologist . The patient was about to be discharged yesterday, however, was noted to be confused by family and discharge was refused. Patient feels comfortable today, electroencephalogram was ordered, however, patient is disoriented, confused more than usual, refuses to take medications, holds food/pills in the mouth, does not swallow, does not speak. Essentially nonverbal. Significant change in mental status since yesterday, remains poorly cooperative, very somnolent. Patient was seen by neurologist, recommended to reinitiate Klonopin at 1 mg 3 times daily dose due to concerns of withdrawal. She also recommended to check for vitamin deficiencies, she felt that it could have been metabolic encephalopathy, since patient's MRI of the brain, electroencephalogram are essentially unremarkable.   Review of Systems  Unable to perform ROS: Mental status change  Gastrointestinal: Negative for abdominal pain and constipation.    VITAL SIGNS: Blood pressure (!) 154/60, pulse 68,  temperature 98.4 F (36.9 C), temperature source Oral, resp. rate 16, height 5\' 8"  (1.727 m), weight 131.5 kg (290 lb), SpO2 97 %.  PHYSICAL EXAMINATION:   GENERAL:  75 y.o.-year-old patient sitting in the bed, comfortable, mouth is closed shut, nonverbal, answers questions shaking  or nodding her head, appropriately, does not swallow pills, but she keeps in the mouth, not able to show tongue or open her mouth  EYES: Pupils equal, round, reactive to light and accommodation. No scleral icterus. Extraocular muscles intact.  HEENT: Head atraumatic, normocephalic. Oropharynx and nasopharynx clear.  NECK:  Supple, no jugular venous distention. No thyroid enlargement, no tenderness.  LUNGS: Normal breath sounds bilaterally, somewhat diminished at bases, no wheezing, rales,rhonchi or crepitation. No use of accessory muscles of respiration.  CARDIOVASCULAR: S1, S2 normal. No murmurs, rubs, or gallops.  ABDOMEN: Soft, no significant tenderness on palpation, mildly uncomfortable on palpation, nondistended. Bowel sounds present. No organomegaly or mass.  EXTREMITIES: No pedal edema, cyanosis, or clubbing. Right lower extremity erythema in the anterior shin area has resolved  NEUROLOGIC: Cranial nerves II through XII are intact. Muscle strength 5/5 in all extremities. Sensation intact. Gait not checked.  PSYCHIATRIC: The patient is alert , nonverbal, able to communicate by nodding or shaking her head, appropriately SKIN: No obvious rash, lesion, or ulcer.  Skin is scaly and thin , but no erythema in the right anterior shin  ORDERS/RESULTS REVIEWED:   CBC  Recent Labs Lab 09/24/16 1835 09/25/16 0140 09/25/16 0940 09/25/16 1719 09/28/16 0521  WBC  --  6.4  --   --  6.1  HGB 12.2 11.9* 11.5* 11.3* 11.7*  HCT  --  36.1  --   --  34.9*  PLT  --  238  --   --  254  MCV  --  82.4  --   --  81.0  MCH  --  27.2  --   --  27.1  MCHC  --  33.0  --   --  33.5  RDW  --  17.0*  --   --  17.1*    ------------------------------------------------------------------------------------------------------------------  Chemistries   Recent Labs Lab 09/25/16 0140 09/27/16 0451 09/30/16 0453  NA 139  --   --   K 4.7  --   --   CL 98*  --   --   CO2 35*  --   --   GLUCOSE 222*  --   --   BUN 43*  --   --   CREATININE 1.32* 1.21* 0.90  CALCIUM 10.0  --   --    ------------------------------------------------------------------------------------------------------------------ estimated creatinine clearance is 79.9 mL/min (by C-G formula based on SCr of 0.9 mg/dL). ------------------------------------------------------------------------------------------------------------------  Recent Labs  10/01/16 1413  TSH 5.008*    Cardiac Enzymes No results for input(s): CKMB, TROPONINI, MYOGLOBIN in the last 168 hours.  Invalid input(s): CK ------------------------------------------------------------------------------------------------------------------ Invalid input(s): POCBNP ---------------------------------------------------------------------------------------------------------------  RADIOLOGY: Mr Brain Wo Contrast  Result Date: 09/30/2016 CLINICAL DATA:  Confusion EXAM: MRI HEAD WITHOUT CONTRAST TECHNIQUE: Multiplanar, multiecho pulse sequences of the brain and surrounding structures were obtained without intravenous contrast. COMPARISON:  CT head 09/24/2016 FINDINGS: Brain: Image quality degraded by motion Negative for acute infarct. Mild chronic microvascular ischemic change in the white matter. Negative for hemorrhage or mass. Ventricle size within normal limits. Vascular: Normal arterial flow voids. Skull and upper cervical spine: Negative Sinuses/Orbits: Negative Other: None IMPRESSION: Image quality degraded by motion.  No acute abnormality. Electronically Signed   By: Franchot Gallo M.D.   On: 09/30/2016 11:44    EKG:  Orders placed or performed during the hospital encounter  of 02/23/16  . ED EKG  . ED EKG  . EKG    ASSESSMENT AND PLAN:  Principal Problem:   Subacute delirium Active Problems:   Altered mental status   Acute urinary retention   UTI (urinary tract infection)   Diffuse abdominal pain   Cellulitis of leg, right   Proctitis   Hypercapnia   Gastrointestinal hemorrhage   Generalized weakness #1 . Recurrent Delirium, initially thought to be due to right lower extremity cellulitis, stercoral proctitis, hypercapnia, improved on supportive therapy, IV fluids, antibiotic therapy to the point that patient was about to be discharged yesterday, however, her behavior was noted to be atypical for her later in the day and patient's sister refused discharge.  Ammonia level was normal. We intentionally decreased oxygen delivery to keep pulse oximeter at around 90-93% due to CO2 retention, repeating ABGs to rule out CO2 retention on room air. Psychiatrist did not recommend any medications. Neurologist was concerned about possible benzodiazepine withdrawal, resumed Klonopin. MRI of brain and electroencephalogram were essentially unremarkable, appreciate neurologist input. Patient may benefit from LP if no significant change in mental status with current therapy and unremarkable labs. TSH was found to be high, checking free T4.  #2. Diffuse abdominal pain , suspected constipation related, CT scan of abdomen and pelvis revealed proctitis, suspected stercoral due to constipation, per gastroenterologist, now off meropenem,  continue regular diet, clinically improved. Bladder scan was 15 cc #3. Sterile Pyuria, initially suspected recurrent urinary tract infection with urinary retention, urinary culture was negative, 1 blood culture negative  , urinary retention was thought to be due to severe constipation, catheter was removed,  restarted Enablex, patient is able to void, bladder scan  was 15 cc today after voiding #4. Renal insufficiency, chronic, CK D stage III,  stable,following  closely #5. Anemia. With rehydration, no active bleeding, following  #6. Acute urinary retention, status post Foley catheter placement,   was thought to be related to constipation,  in and out catheterization as needed to be continued, restarted Enablex, patient is able to void, bladder scan was 15 cc #7, constipation, continue MiraLAX, patient is having good stool output.  #8 proctitis, suspected stercoral, continue MiraLAX to relieve constipation, improved clinically, patient was seen by gastroenterologist, who did not recommend flexible sigmoidoscopy at this time, but conservative management.  #9. Generalized weakness. Patient refused physical therapist intervention in the past, she is not a candidate for skilled nursing facility placement for Management    Management plans discussed with the patient, family and they are in agreement.   DRUG ALLERGIES:  Allergies  Allergen Reactions  . Sulfa Antibiotics Hives  . Biaxin [Clarithromycin] Hives  . Eggs Or Egg-Derived Products Other (See Comments)    Pt states that her allergy test said she was allergic to eggs.    . Influenza A (H1n1) Monoval Vac Other (See Comments)    Pt states that she was told by her MD not to get the influenza vaccine.    . Morphine Other (See Comments)    Reaction:  Dizziness and confusion   . Pyridium [Phenazopyridine Hcl] Other (See Comments)    Reaction:  Unknown   . Ceftriaxone Anxiety  . Latex Rash  . Prednisone Rash  . Tape Rash    CODE STATUS:     Code Status Orders        Start     Ordered   09/24/16 1731  Full code  Continuous     09/24/16 1730    Code Status History    Date Active Date Inactive Code Status Order ID Comments User Context   09/01/2016  2:17 AM 09/05/2016  4:28 PM Full Code [id number]  Harvie Bridge, DO Inpatient   08/09/2016  8:27 PM 08/12/2016  8:15 PM Full Code [id number]  Henreitta Leber, MD Inpatient   09/30/2015  2:23 PM 10/07/2015  5:37 PM Full  Code [id number]  Loletha Grayer, MD ED   09/05/2015  5:56 PM 09/09/2015  5:38 PM DNR [id number]  Loletha Grayer, MD ED    Advance Directive Documentation   [place] Most Recent Value  Type of Advance Directive  Living will  Pre-existing out of facility DNR order (yellow form or pink MOST form)  No data  "MOST" Form in Place?  No data      TOTAL TIME TAKING CARE OF THIS PATIENT: 35 minutes.   Discussed with neurologist Hayze Gazda M.D on 10/01/2016 at 4:16 PM  Between 7am to 6pm - Pager - [phone]  After 6pm go to [email or web address] - password [email or web address] [facility]  [facility]  Office  [phone]  CC: Primary care physician; Leonel Ramsay, MD

## 2016-10-01 NOTE — Consult Note (Signed)
Reason for Consult:AMS Referring Physician: Ether Griffins  CC: AMS  HPI: Carolyn Valdez is an 75 y.o. female with multiple medical problems admitted 11/15 with altered mental status and UTI.  It seems that she was discharged at baseline but on 12/7 again became confused and when brought back to the hospital was felt to again have a UTI.  Patient treated for UTI but continues to have altered mental status.  Consult called for further recommendations.   Patient unable to provide history and no family at the bedside.  All history obtained from the chart.    Past Medical History:  Diagnosis Date  . Anxiety   . Breast cancer ([place])    16 yrs ago  . CHF (congestive heart failure) ([place])   . Diabetes mellitus without complication ([place])   . DJD (degenerative joint disease)   . Dysuria   . H/O total knee replacement    right  . HTN (hypertension)   . Hypercholesteremia   . Kidney stone   . Obesity   . Recurrent urinary tract infection   . SVT (supraventricular tachycardia) ([place])   . Urinary incontinence   . Vaginal atrophy   . Yeast vaginitis     Past Surgical History:  Procedure Laterality Date  . CARDIAC CATHETERIZATION    . CHOLECYSTECTOMY    . FOOT SURGERY    . JOINT REPLACEMENT    . MASTECTOMY Right 1997  . NASAL SEPTUM SURGERY    . PERIPHERAL VASCULAR CATHETERIZATION N/A 07/08/2015   Procedure: PICC Line Insertion;  Surgeon: Algernon Huxley, MD;  Location: [facility];  Service: Cardiovascular;  Laterality: N/A;  . TONSILLECTOMY    . Vocal cords      Family History  Problem Relation Age of Onset  . Lung cancer Father   . Hematuria Mother   . Lung cancer Mother   . Kidney disease Neg Hx   . Bladder Cancer Neg Hx   . Breast cancer Neg Hx     Social History:  reports that she has quit smoking. She quit smokeless tobacco use about 20 years ago. She reports that she does not drink alcohol or use drugs.  Allergies  Allergen Reactions  . Sulfa Antibiotics Hives  . Biaxin  [Clarithromycin] Hives  . Eggs Or Egg-Derived Products Other (See Comments)    Pt states that her allergy test said she was allergic to eggs.    . Influenza A (H1n1) Monoval Vac Other (See Comments)    Pt states that she was told by her MD not to get the influenza vaccine.    . Morphine Other (See Comments)    Reaction:  Dizziness and confusion   . Pyridium [Phenazopyridine Hcl] Other (See Comments)    Reaction:  Unknown   . Ceftriaxone Anxiety  . Latex Rash  . Prednisone Rash  . Tape Rash    Medications:  I have reviewed the patient's current medications. Prior to Admission:  Prescriptions Prior to Admission  Medication Sig Dispense Refill Last Dose  . amLODipine (NORVASC) 2.5 MG tablet Take 1 tablet (2.5 mg total) by mouth daily. 30 tablet 0 09/24/2016 at 0900  . atorvastatin (LIPITOR) 20 MG tablet Take 20 mg by mouth at bedtime.   09/23/2016 at 2100  . clonazePAM (KLONOPIN) 1 MG tablet Take 1 mg by mouth 3 (three) times daily.   09/24/2016 at 0900  . conjugated estrogens (PREMARIN) vaginal cream Place 1 Applicatorful vaginally 3 (three) times a week. Pt uses on  Monday, Wednesday, and Friday.   Past Week at Unknown time  . desmopressin (DDAVP) 0.2 MG tablet Take 1 tablet (0.2 mg total) by mouth at bedtime. 90 tablet 3 09/23/2016 at 2100  . docusate sodium (COLACE) 100 MG capsule Take 200 mg by mouth 2 (two) times daily.   09/24/2016 at 0900  . fexofenadine (ALLEGRA) 180 MG tablet Take 180 mg by mouth daily.   09/24/2016 at 0900  . furosemide (LASIX) 20 MG tablet Take 1 tablet (20 mg total) by mouth daily. 30 tablet 0 09/24/2016 at 0900  . gabapentin (NEURONTIN) 300 MG capsule Take 300-600 mg by mouth 3 (three) times daily. Pt takes two capsules in the morning, one capsule at lunch, and two capsules in the evening.   09/24/2016 at 0900  . hydrOXYzine (ATARAX/VISTARIL) 25 MG tablet Take 25 mg by mouth at bedtime.    09/23/2016 at 2100  . insulin aspart (NOVOLOG) 100 UNIT/ML injection Inject 0-20  Units into the skin 3 (three) times daily with meals. 10 mL 11 09/24/2016 at 1130  . insulin aspart (NOVOLOG) 100 UNIT/ML injection Inject 0-5 Units into the skin at bedtime. 10 mL 11 09/23/2016 at 2100  . insulin glargine (LANTUS) 100 UNIT/ML injection Inject 50 Units into the skin at bedtime.   09/23/2016 at 2100  . iron polysaccharides (NIFEREX) 150 MG capsule Take 150 mg by mouth 3 (three) times daily.    09/24/2016 at 0900  . metoprolol succinate (TOPROL-XL) 25 MG 24 hr tablet Take 1 tablet (25 mg total) by mouth 2 (two) times daily. 30 tablet 0 09/24/2016 at 0900  . nystatin (MYCOSTATIN/NYSTOP) powder Apply topically 3 (three) times daily. 15 g 0 09/24/2016 at 0900  . omeprazole (PRILOSEC) 40 MG capsule Take 40 mg by mouth 2 (two) times daily.   09/24/2016 at 0900  . ondansetron (ZOFRAN ODT) 4 MG disintegrating tablet Take 1 tablet (4 mg total) by mouth every 8 (eight) hours as needed for nausea or vomiting. 20 tablet 0 prn at prn  . pioglitazone (ACTOS) 30 MG tablet Take 30 mg by mouth daily.   09/24/2016 at 0900  . potassium chloride SA (K-DUR,KLOR-CON) 20 MEQ tablet Take 1 tablet (20 mEq total) by mouth daily. 30 tablet 0 09/24/2016 at 0900  . VESICARE 10 MG tablet Take 1 tablet (10 mg total) by mouth daily. 90 tablet 3 09/24/2016 at 0900  . vitamin E 1000 UNIT capsule Take 1,000 Units by mouth daily.   09/24/2016 at 0900   Scheduled: . amLODipine  5 mg Oral Daily  . atorvastatin  20 mg Oral QHS  . conjugated estrogens  1 Applicatorful Vaginal Once per day on Mon Wed Fri  . darifenacin  15 mg Oral Daily  . desmopressin  0.2 mg Oral QHS  . docusate sodium  100 mg Oral BID  . enoxaparin (LOVENOX) injection  40 mg Subcutaneous Q12H  . insulin aspart  0-5 Units Subcutaneous QHS  . insulin aspart  0-9 Units Subcutaneous TID WC  . insulin glargine  50 Units Subcutaneous QHS  . iron polysaccharides  150 mg Oral TID  . loratadine  10 mg Oral Daily  . metoCLOPramide  5 mg Oral TID AC  . metoprolol  succinate  50 mg Oral BID  . nystatin   Topical TID  . pantoprazole  40 mg Oral BID  . potassium chloride SA  20 mEq Oral Daily  . senna  1 tablet Oral Daily  . vitamin E  1,000 Units Oral  Daily    ROS: Patient unable to provide  Physical Examination: Blood pressure (!) 168/57, pulse 72, temperature 98.6 F (37 C), temperature source Oral, resp. rate 16, height 5' 8"  (1.727 m), weight 131.5 kg (290 lb), SpO2 97 %.  HEENT-  Normocephalic, no lesions, without obvious abnormality.  Normal external eye and conjunctiva.  Normal TM's bilaterally.  Normal auditory canals and external ears. Normal external nose, mucus membranes and septum.  Normal pharynx. Cardiovascular- S1, S2 normal, pulses palpable throughout   Lungs- chest clear, no wheezing, rales, normal symmetric air entry Abdomen- soft, non-tender; bowel sounds normal; no masses,  no organomegaly Extremities- LE edema Lymph-no adenopathy palpable Musculoskeletal-no joint tenderness, deformity or swelling Skin-warm and dry, no hyperpigmentation, vitiligo, or suspicious lesions  Neurological Examination Mental Status: Alert.  Unable to answer any questions concerning orientation and does not even attempt.  Speech minimal but fluent.  Does not follow commands. Cranial Nerves: II: Discs flat bilaterally; Blinks to bilateral confrontaion, pupils equal, round, reactive to light and accommodation III,IV, VI: ptosis not present, extra-ocular motions intact bilaterally V,VII: smile symmetric, facial light touch sensation normal bilaterally VIII: hearing normal bilaterally IX,X: gag reflex present XI: bilateral shoulder shrug XII: midline tongue extension Motor: Moves both upper extremities strongly against gravity.  Although both lower extremities move, does not lift them off the bed.   Sensory: Responds to noxious stimuli throughout Deep Tendon Reflexes: 1+ and symmetric with absent AJ's bilaterally Plantars: Right: downgoing   Left:  downgoing Cerebellar: Unable to perform Gait: not tested due to safety concerns   Laboratory Studies:   Basic Metabolic Panel:  Recent Labs Lab 09/25/16 0140 09/27/16 0451 09/30/16 0453  NA 139  --   --   K 4.7  --   --   CL 98*  --   --   CO2 35*  --   --   GLUCOSE 222*  --   --   BUN 43*  --   --   CREATININE 1.32* 1.21* 0.90  CALCIUM 10.0  --   --     Liver Function Tests: No results for input(s): AST, ALT, ALKPHOS, BILITOT, PROT, ALBUMIN in the last 168 hours. No results for input(s): LIPASE, AMYLASE in the last 168 hours.  Recent Labs Lab 09/25/16 1008  AMMONIA 21    CBC:  Recent Labs Lab 09/24/16 1835 09/25/16 0140 09/25/16 0940 09/25/16 1719 09/28/16 0521  WBC  --  6.4  --   --  6.1  HGB 12.2 11.9* 11.5* 11.3* 11.7*  HCT  --  36.1  --   --  34.9*  MCV  --  82.4  --   --  81.0  PLT  --  238  --   --  254    Cardiac Enzymes: No results for input(s): CKTOTAL, CKMB, CKMBINDEX, TROPONINI in the last 168 hours.  BNP: Invalid input(s): POCBNP  CBG:  Recent Labs Lab 09/30/16 1310 09/30/16 1808 09/30/16 2103 10/01/16 0726 10/01/16 1128  GLUCAP 135* 132* 127* 117* 105*    Microbiology: Results for orders placed or performed during the hospital encounter of 09/24/16  Urine culture     Status: None   Collection Time: 09/24/16 12:15 PM  Result Value Ref Range Status   Specimen Description URINE, RANDOM  Final   Special Requests NONE  Final   Culture NO GROWTH Performed at [facility]   Final   Report Status 09/26/2016 FINAL  Final  CULTURE, BLOOD (ROUTINE X 2) w  Reflex to ID Panel     Status: None   Collection Time: 09/24/16  6:35 PM  Result Value Ref Range Status   Specimen Description BLOOD  RIGHT [facility]  Final   Special Requests   Final    BOTTLES DRAWN AEROBIC AND ANAEROBIC AER 8ML ANA 9ML   Culture NO GROWTH 5 DAYS  Final   Report Status 09/29/2016 FINAL  Final    Coagulation Studies: No results for input(s): LABPROT, INR in  the last 72 hours.  Urinalysis: No results for input(s): COLORURINE, LABSPEC, PHURINE, GLUCOSEU, HGBUR, BILIRUBINUR, KETONESUR, PROTEINUR, UROBILINOGEN, NITRITE, LEUKOCYTESUR in the last 168 hours.  Invalid input(s): APPERANCEUR  Lipid Panel:     Component Value Date/Time   CHOL 159 09/06/2015 0126   TRIG 152 (H) 09/06/2015 0126   HDL 32 (L) 09/06/2015 0126   CHOLHDL 5.0 09/06/2015 0126   VLDL 30 09/06/2015 0126   LDLCALC 97 09/06/2015 0126    HgbA1C:  Lab Results  Component Value Date   HGBA1C 6.8 (H) 08/12/2016    Urine Drug Screen:  No results found for: LABOPIA, COCAINSCRNUR, LABBENZ, AMPHETMU, THCU, LABBARB  Alcohol Level: No results for input(s): ETH in the last 168 hours.   Imaging: Mr Brain Wo Contrast  Result Date: 09/30/2016 CLINICAL DATA:  Confusion EXAM: MRI HEAD WITHOUT CONTRAST TECHNIQUE: Multiplanar, multiecho pulse sequences of the brain and surrounding structures were obtained without intravenous contrast. COMPARISON:  CT head 09/24/2016 FINDINGS: Brain: Image quality degraded by motion Negative for acute infarct. Mild chronic microvascular ischemic change in the white matter. Negative for hemorrhage or mass. Ventricle size within normal limits. Vascular: Normal arterial flow voids. Skull and upper cervical spine: Negative Sinuses/Orbits: Negative Other: None IMPRESSION: Image quality degraded by motion.  No acute abnormality. Electronically Signed   By: Franchot Gallo M.D.   On: 09/30/2016 11:44     Assessment/Plan: 75 year old female admitted with infection and AMS.  Infection treated but mental status remains altered.  Neurological examination is nonfocal.  MRI of the brain reviewed and shows no acute changes.  EEG with significant artifact but no obvious epileptiform changes.  Background slow.  Suspect metabolic encephalopathy from multiple causes including reduced renal function, hypercarbia, hypoxia, recent prolonged infection to name a few.  Mental function  may lag behind improvement in lab work.  Would also rule out any nutritional deficiencies.  Patient has also recently had abrupt discontinuation of her benzos.  Would reinstitute.    Recommendations: 1. Agree with continued addressing of medical issues.   2. Klonopin 49m bid 3. B1, B12, folate, TSH, ESR, CRP  LAlexis Goodell MD Neurology [phone]/15/2017, 1:39 PM

## 2016-10-01 NOTE — Care Management Important Message (Signed)
Important Message  Patient Details  Name: Carolyn Valdez MRN: [id number] Date of Birth: 02-02-1941   Medicare Important Message Given:  Yes    Shelbie Ammons, RN 10/01/2016, 10:56 AM

## 2016-10-02 LAB — GLUCOSE, CAPILLARY
GLUCOSE-CAPILLARY: 93 mg/dL (ref 65–99)
Glucose-Capillary: 131 mg/dL — ABNORMAL HIGH (ref 65–99)
Glucose-Capillary: 102 mg/dL — ABNORMAL HIGH (ref 65–99)
Glucose-Capillary: 129 mg/dL — ABNORMAL HIGH (ref 65–99)

## 2016-10-02 LAB — COMPREHENSIVE METABOLIC PANEL
ALBUMIN: 3.4 g/dL — AB (ref 3.5–5.0)
ALK PHOS: 59 U/L (ref 38–126)
ALT: 16 U/L (ref 14–54)
AST: 34 U/L (ref 15–41)
Anion gap: 9 (ref 5–15)
BILIRUBIN TOTAL: 0.9 mg/dL (ref 0.3–1.2)
BUN: 12 mg/dL (ref 6–20)
CALCIUM: 10 mg/dL (ref 8.9–10.3)
CO2: 30 mmol/L (ref 22–32)
Chloride: 96 mmol/L — ABNORMAL LOW (ref 101–111)
Creatinine, Ser: 0.86 mg/dL (ref 0.44–1.00)
GFR calc Af Amer: >60 mL/min (ref >60)
GFR calc non Af Amer: >60 mL/min (ref >60)
GLUCOSE: 92 mg/dL (ref 65–99)
POTASSIUM: 3.6 mmol/L (ref 3.5–5.1)
Sodium: 135 mmol/L (ref 135–145)
TOTAL PROTEIN: 7 g/dL (ref 6.5–8.1)

## 2016-10-02 LAB — CBC
HCT: 39.6 % (ref 35.0–47.0)
Hemoglobin: 13.3 g/dL (ref 12.0–16.0)
MCH: 27.2 pg (ref 26.0–34.0)
MCHC: 33.7 g/dL (ref 32.0–36.0)
MCV: 80.7 fL (ref 80.0–100.0)
PLATELETS: 294 10*3/uL (ref 150–440)
RBC: 4.91 MIL/uL (ref 3.80–5.20)
RDW: 16.6 % — ABNORMAL HIGH (ref 11.5–14.5)
WBC: 8.1 10*3/uL (ref 3.6–11.0)

## 2016-10-02 NOTE — Progress Notes (Signed)
[place] [facility] [place] NAME: Carolyn Valdez    MR#:  [id number]  DATE OF BIRTH:  12-05-1940  SUBJECTIVE:   She was originally admitted to the hospital due to altered mental status, delirium, urinary retention. Mental status still is not back to baseline. Patient remains nonverbal and delirious at times. No acute infectious, metabolic source presently.  REVIEW OF SYSTEMS:    Review of Systems  Unable to perform ROS: Mental acuity    Nutrition: Heart Healthy/Carb control Tolerating Diet: Yes Tolerating PT: Await Aval. & bedbound   DRUG ALLERGIES:   Allergies  Allergen Reactions  . Sulfa Antibiotics Hives  . Biaxin [Clarithromycin] Hives  . Eggs Or Egg-Derived Products Other (See Comments)    Pt states that her allergy test said she was allergic to eggs.    . Influenza A (H1n1) Monoval Vac Other (See Comments)    Pt states that she was told by her MD not to get the influenza vaccine.    . Morphine Other (See Comments)    Reaction:  Dizziness and confusion   . Pyridium [Phenazopyridine Hcl] Other (See Comments)    Reaction:  Unknown   . Ceftriaxone Anxiety  . Latex Rash  . Prednisone Rash  . Tape Rash    VITALS:  Blood pressure (!) 142/58, pulse 85, temperature 97 F (36.1 C), temperature source Axillary, resp. rate 18, height 5\' 8"  (1.727 m), weight 131.5 kg (290 lb), SpO2 95 %.  PHYSICAL EXAMINATION:   Physical Exam  GENERAL:  75 y.o.-year-old obese patient lying in the bed non-verbal but in NAD.  EYES: Pupils equal, round, reactive to light.. No scleral icterus. Extraocular muscles intact.  HEENT: Head atraumatic, normocephalic. Oropharynx and nasopharynx clear.  NECK:  Supple, no jugular venous distention. No thyroid enlargement, no tenderness.  LUNGS: Normal breath sounds bilaterally, no wheezing, rales, rhonchi. No use of accessory muscles of respiration.  CARDIOVASCULAR: S1, S2 normal. No murmurs, rubs, or gallops.  ABDOMEN:  Soft, nontender, nondistended. Bowel sounds present. No organomegaly or mass.  EXTREMITIES: No cyanosis, clubbing, +1-2 edema b/l due to chronic venous stasis  NEUROLOGIC: Cranial nerves II through XII are intact. No focal Motor or sensory deficits b/l. Globally weak.   PSYCHIATRIC: The patient is alert and oriented x 1.  SKIN: No obvious rash, lesion, or ulcer.    LABORATORY PANEL:   CBC  Recent Labs Lab 10/02/16 0516  WBC 8.1  HGB 13.3  HCT 39.6  PLT 294   ------------------------------------------------------------------------------------------------------------------  Chemistries   Recent Labs Lab 10/02/16 0516  NA 135  K 3.6  CL 96*  CO2 30  GLUCOSE 92  BUN 12  CREATININE 0.86  CALCIUM 10.0  AST 34  ALT 16  ALKPHOS 59  BILITOT 0.9   ------------------------------------------------------------------------------------------------------------------  Cardiac Enzymes No results for input(s): TROPONINI in the last 168 hours. ------------------------------------------------------------------------------------------------------------------  RADIOLOGY:  No results found.   ASSESSMENT AND PLAN:   75 year old female with past medical history of diabetes, recurrent urinary tract infections, previous history of ESBL UTI, attention, hyperlipidemia, obesity, history of SVT, CHF, breast cancer who presented to the hospital due to altered mental status/delirium.  1. Altered mental status/delirium-etiology still unclear presently. Patient has had previous history of urinary tract infections but currently does not have a UTI. -No evidence of acute metabolic source. Seen by neurology and as per them this suspect metabolic encephalopathy. EEG negative for any acute seizures. Patient used to be on Klonopin which was suddenly  stopped and therefore has been reinitiated. -Serologic workup so far has been negative. -Possibly psychiatric in nature and will need to readdress them.  Follow mental status  2. DM Type II w/out complication - cont. Lantus, Novolog SS  3. Anxiety - cont. Klonopin  4. Hyperlipidemia - cont. Atorvastatin  5. HTN - cont. Norvasc, Toprol  6. GERD - cont. Protonix.   7. Hx of Diabetic Gastroparesis - cont. Reglan  Discussed plan of care and updated sister over the phone.   All the records are reviewed and case discussed with Care Management/Social Worker. Management plans discussed with the patient, family and they are in agreement.  CODE STATUS: Full Code  DVT Prophylaxis: Lovenox  TOTAL TIME TAKING CARE OF THIS PATIENT: 30 minutes.   POSSIBLE D/C IN 2-3 DAYS, DEPENDING ON CLINICAL CONDITION.   Henreitta Leber M.D on 10/02/2016 at 2:58 PM  Between 7am to 6pm - Pager - [phone]  After 6pm go to [email or web address] - [employer] [facility]  Office  [phone]  CC: Primary care physician; Leonel Ramsay, MD

## 2016-10-03 LAB — GLUCOSE, CAPILLARY
GLUCOSE-CAPILLARY: 96 mg/dL (ref 65–99)
Glucose-Capillary: 107 mg/dL — ABNORMAL HIGH (ref 65–99)
Glucose-Capillary: 94 mg/dL (ref 65–99)
Glucose-Capillary: 110 mg/dL — ABNORMAL HIGH (ref 65–99)

## 2016-10-03 NOTE — Progress Notes (Signed)
[place] at [place] NAME: Carolyn Valdez    MR#:  [id number]  DATE OF BIRTH:  1941-07-02  SUBJECTIVE:   She was originally admitted to the hospital due to altered mental status, delirium, urinary retention. Mental status still is not back to baseline. Remains non-verbal.  No acute events overnight.    REVIEW OF SYSTEMS:    Review of Systems  Unable to perform ROS: Mental acuity    Nutrition: Heart Healthy/Carb control Tolerating Diet: Yes Tolerating PT: Await Aval. & bedbound   DRUG ALLERGIES:   Allergies  Allergen Reactions  . Sulfa Antibiotics Hives  . Biaxin [Clarithromycin] Hives  . Eggs Or Egg-Derived Products Other (See Comments)    Pt states that her allergy test said she was allergic to eggs.    . Influenza A (H1n1) Monoval Vac Other (See Comments)    Pt states that she was told by her MD not to get the influenza vaccine.    . Morphine Other (See Comments)    Reaction:  Dizziness and confusion   . Pyridium [Phenazopyridine Hcl] Other (See Comments)    Reaction:  Unknown   . Ceftriaxone Anxiety  . Latex Rash  . Prednisone Rash  . Tape Rash    VITALS:  Blood pressure (!) 154/81, pulse 86, temperature 98.7 F (37.1 C), temperature source Axillary, resp. rate (!) 22, height 5\' 8"  (1.727 m), weight 131.5 kg (290 lb), SpO2 95 %.  PHYSICAL EXAMINATION:   Physical Exam  GENERAL:  75 y.o.-year-old obese patient lying in the bed non-verbal but in NAD.  EYES: Pupils equal, round, reactive to light.. No scleral icterus. Extraocular muscles intact.  HEENT: Head atraumatic, normocephalic. Oropharynx and nasopharynx clear. Dry oral Mucosa. NECK:  Supple, no jugular venous distention. No thyroid enlargement, no tenderness.  LUNGS: Normal breath sounds bilaterally, no wheezing, rales, rhonchi. No use of accessory muscles of respiration.  CARDIOVASCULAR: S1, S2 normal. No murmurs, rubs, or gallops.  ABDOMEN: Soft, nontender,  nondistended. Bowel sounds present. No organomegaly or mass.  EXTREMITIES: No cyanosis, clubbing, +1-2 edema b/l due to chronic venous stasis  NEUROLOGIC: Cranial nerves II through XII are intact. No focal Motor or sensory deficits b/l. Globally weak.   PSYCHIATRIC: The patient is alert and oriented x 1.  SKIN: No obvious rash, lesion, or ulcer.    LABORATORY PANEL:   CBC  Recent Labs Lab 10/02/16 0516  WBC 8.1  HGB 13.3  HCT 39.6  PLT 294   ------------------------------------------------------------------------------------------------------------------  Chemistries   Recent Labs Lab 10/02/16 0516  NA 135  K 3.6  CL 96*  CO2 30  GLUCOSE 92  BUN 12  CREATININE 0.86  CALCIUM 10.0  AST 34  ALT 16  ALKPHOS 59  BILITOT 0.9   ------------------------------------------------------------------------------------------------------------------  Cardiac Enzymes No results for input(s): TROPONINI in the last 168 hours. ------------------------------------------------------------------------------------------------------------------  RADIOLOGY:  No results found.   ASSESSMENT AND PLAN:   75 year old female with past medical history of diabetes, recurrent urinary tract infections, previous history of ESBL UTI, attention, hyperlipidemia, obesity, history of SVT, CHF, breast cancer who presented to the hospital due to altered mental status/delirium.  1. Altered mental status/delirium-etiology still unclear presently. Patient has had previous history of urinary tract infections but currently does not have a UTI. -No evidence of acute metabolic source. Seen by neurology and as per them suspect metabolic encephalopathy. EEG negative for any acute seizures. Patient used to be on Klonopin which was suddenly stopped and  therefore has been reinitiated. -Serologic workup so far has been negative. -likely Psych in nature as pt. Has selective mutism and will need to address with them  tomorrow.   2. DM Type II w/out complication - cont. Lantus, Novolog SS - BS stable.    3. Anxiety - cont. Klonopin  4. Hyperlipidemia - cont. Atorvastatin  5. HTN - cont. Norvasc, Toprol  6. GERD - cont. Protonix.   7. Hx of Diabetic Gastroparesis - cont. Reglan   All the records are reviewed and case discussed with Care Management/Social Worker. Management plans discussed with the patient, family and they are in agreement.  CODE STATUS: Full Code  DVT Prophylaxis: Lovenox  TOTAL TIME TAKING CARE OF THIS PATIENT: 25 minutes.   POSSIBLE D/C IN 2-3 DAYS, DEPENDING ON CLINICAL CONDITION.   Henreitta Leber M.D on 10/03/2016 at 1:09 PM  Between 7am to 6pm - Pager - [phone]  After 6pm go to [email or web address] - [employer] [facility]  Office  [phone]  CC: Primary care physician; Leonel Ramsay, MD

## 2016-10-04 DIAGNOSIS — F323 Major depressive disorder, single episode, severe with psychotic features: Secondary | ICD-10-CM

## 2016-10-04 LAB — GLUCOSE, CAPILLARY
GLUCOSE-CAPILLARY: 68 mg/dL (ref 65–99)
GLUCOSE-CAPILLARY: 83 mg/dL (ref 65–99)
GLUCOSE-CAPILLARY: 152 mg/dL — AB (ref 65–99)
GLUCOSE-CAPILLARY: 158 mg/dL — AB (ref 65–99)
Glucose-Capillary: 123 mg/dL — ABNORMAL HIGH (ref 65–99)

## 2016-10-04 MED ORDER — INSULIN GLARGINE 100 UNIT/ML ~~LOC~~ SOLN
40.0000 [IU] | Freq: Every day | SUBCUTANEOUS | Status: DC
  Administered 2016-10-04 – 2016-10-14 (×11): 40 [IU] via SUBCUTANEOUS
  Filled 2016-10-04 (×12): qty 0.4

## 2016-10-04 MED ORDER — CITALOPRAM HYDROBROMIDE 10 MG PO TABS
10.0000 mg | ORAL_TABLET | Freq: Every day | ORAL | Status: DC
  Administered 2016-10-04 – 2016-10-06 (×3): 10 mg via ORAL
  Filled 2016-10-04 (×3): qty 1

## 2016-10-04 MED ORDER — ARIPIPRAZOLE 2 MG PO TABS
2.0000 mg | ORAL_TABLET | Freq: Every day | ORAL | Status: DC
  Administered 2016-10-04 – 2016-10-06 (×3): 2 mg via ORAL
  Filled 2016-10-04 (×4): qty 1

## 2016-10-04 MED ORDER — CLONAZEPAM 0.5 MG PO TABS
0.5000 mg | ORAL_TABLET | Freq: Two times a day (BID) | ORAL | Status: DC
  Administered 2016-10-04 – 2016-10-08 (×8): 0.5 mg via ORAL
  Filled 2016-10-04 (×8): qty 1

## 2016-10-04 NOTE — Progress Notes (Addendum)
Inpatient Diabetes Program Recommendations  AACE/ADA: New Consensus Statement on Inpatient Glycemic Control (2015)  Target Ranges:  Prepandial:   less than 140 mg/dL      Peak postprandial:   less than 180 mg/dL (1-2 hours)      Critically ill patients:  140 - 180 mg/dL   Lab Results  Component Value Date   GLUCAP 123 (H) 10/04/2016   HGBA1C 6.8 (H) 08/12/2016    Review of Glycemic Control:  Results for HADASA, GASNER (MRN [id number]) as of 10/04/2016 12:55  Ref. Range 10/03/2016 07:39 10/03/2016 11:43 10/03/2016 16:18 10/03/2016 20:56 10/04/2016 07:32 10/04/2016 08:45 10/04/2016 11:25  Glucose-Capillary Latest Ref Range: 65 - 99 mg/dL 107 (H) 94 96 110 (H) 68 83 123 (H)    Current orders for Inpatient glycemic control: Lantus 50 units q HS, Novolog Sensitive tid with meals and HS  Inpatient Diabetes Program Recommendations:    Note CBG<70 mg/dL this AM. Please consider reducing Lantus to 40 units q HS.   Thanks, Adah Perl, RN, BC-ADM Inpatient Diabetes Coordinator Pager [phone] (8a-5p)

## 2016-10-04 NOTE — Consult Note (Signed)
[facility] Consult   Reason for Consult:  Consult for 75 year old woman seen previously a few days ago. Reconsult because of concern about depression Referring Physician:  Sainani Patient Identification: Carolyn Valdez MRN:  [id number] Principal Diagnosis: Severe major depression, single episode, with psychotic features [facility]) Diagnosis:   Patient Active Problem List   Diagnosis Date Noted  . Severe major depression, single episode, with psychotic features ([place]) [F32.3] 10/04/2016  . Cellulitis of leg, right [L03.115] 09/29/2016  . Proctitis [K62.89] 09/29/2016  . Hypercapnia [R06.89] 09/29/2016  . Acute urinary retention [R33.8] 09/29/2016  . UTI (urinary tract infection) [N39.0] 09/29/2016  . Generalized weakness [R53.1] 09/29/2016  . Subacute delirium [F05] 09/28/2016  . Gastrointestinal hemorrhage [K92.2]   . Diffuse abdominal pain [R10.84]   . Altered mental status [R41.82] 09/24/2016  . Pressure injury of skin [L89.90] 09/02/2016  . Respiratory failure with hypoxia ([place]) [J96.91] 09/01/2016  . Lymphedema [I89.0] 08/16/2016  . CHF (congestive heart failure) ([place]) [I50.9] 08/09/2016  . Congestive heart failure ([place]) [I50.9] 11/25/2015  . Nocturia [R35.1] 11/05/2015  . Urinary frequency [R35.0] 11/05/2015  . Acute respiratory failure with hypoxia ([place]) [J96.01] 09/05/2015  . Recurrent UTI [N39.0] 04/20/2015  . Incontinence [R32] 04/20/2015  . Diabetes mellitus, type 2 ([place]) [E11.9] 04/17/2015  . ESBL (extended spectrum beta-lactamase) producing bacteria infection [A49.9, Z16.12] 04/17/2015  . BP (high blood pressure) [I10] 04/17/2015  . Frequent UTI [N39.0] 04/17/2015  . Absence of bladder continence [R32] 04/17/2015  . Iron deficiency anemia [D50.9] 03/08/2015  . Absolute anemia [D64.9] 09/25/2014  . Abdominal pain, lower [R10.30] 09/25/2014  . Urge incontinence [N39.41] 02/19/2013  . FOM (frequency of micturition) [R35.0] 02/19/2013  . Bladder infection,  chronic [N30.20] 08/11/2012  . Difficult or painful urination [R30.0] 07/26/2012  . Lower urinary tract infection [N39.0] 12/31/2011  . Diabetes mellitus ([place]) [E11.9] 12/31/2011    Total Time spent with patient: 1 hour  Subjective:   Carolyn Valdez is a 75 y.o. female patient admitted with patient was not able to give any information.  HPI:  Tried to interview patient spent a fair bit of time trying to coax her into conversation. Chart reviewed. Labs reviewed. Spoke with her sister on the telephone. This 62 year old woman who I saw several days ago I was asked to reconsult on today. When I saw her a few days ago she seemed in fairly good shape. Apparently in the last several days she has had a profound change in her mental state. When I came to see her this evening she appeared to be awake with her eyes open but did not communicate with me at all. She did say the word yes at one point when I ask her if she could speak but then would not respond verbally to anything else. Furthermore she wouldn't respond to any of my requests that she move her hand or close her eyes or do anything else. She continued to keep her eyes open but did not really make much eye contact and instead just stared around the room. Affect appeared to be completely flat. She frequently would put her hands up on her head as though she were in some kind of distress. Apparently she has been not communicating with most of the staff over the last couple days. I spoke with her sister today however who said that the patient "talked all day". He said that the patient can communicate at times when she wants to but that she seems mentally to be very different  than usual. So far none of the labs are work up shows anything that would account for this kind of profound change. MRI unremarkable. All the labs pretty unremarkable.  Social history: Patient at baseline lives alone. Sounds like is a fair bit of weird drama in the family according to what  the sister described with a lot of drama and conflict between several members of the family.  Medical history: Multiple medical problems.  Substance abuse history: Nonidentified  Past Psychiatric History: There is nothing to document that the patient has had any severe depression in the past. No psychiatric hospitalization no history of psychosis no history of bipolar disorder. She was not on any psychiatric medicine except a chronic dose of Klonopin. Sister says she's never seen her relative be like this before.  Risk to Self: Is patient at risk for suicide?: No Risk to Others:   Prior Inpatient Therapy:   Prior Outpatient Therapy:    Past Medical History:  Past Medical History:  Diagnosis Date  . Anxiety   . Breast cancer ([place])    16 yrs ago  . CHF (congestive heart failure) ([place])   . Diabetes mellitus without complication ([place])   . DJD (degenerative joint disease)   . Dysuria   . H/O total knee replacement    right  . HTN (hypertension)   . Hypercholesteremia   . Kidney stone   . Obesity   . Recurrent urinary tract infection   . SVT (supraventricular tachycardia) ([place])   . Urinary incontinence   . Vaginal atrophy   . Yeast vaginitis     Past Surgical History:  Procedure Laterality Date  . CARDIAC CATHETERIZATION    . CHOLECYSTECTOMY    . FOOT SURGERY    . JOINT REPLACEMENT    . MASTECTOMY Right 1997  . NASAL SEPTUM SURGERY    . PERIPHERAL VASCULAR CATHETERIZATION N/A 07/08/2015   Procedure: PICC Line Insertion;  Surgeon: Algernon Huxley, MD;  Location: [facility];  Service: Cardiovascular;  Laterality: N/A;  . TONSILLECTOMY    . Vocal cords     Family History:  Family History  Problem Relation Age of Onset  . Lung cancer Father   . Hematuria Mother   . Lung cancer Mother   . Kidney disease Neg Hx   . Bladder Cancer Neg Hx   . Breast cancer Neg Hx    Family Psychiatric  History: Nothing identified Social History:  History  Alcohol Use No      History  Drug Use No    Social History   Social History  . Marital status: Widowed    Spouse name: N/A  . Number of children: N/A  . Years of education: N/A   Social History Main Topics  . Smoking status: Former Research scientist (life sciences)  . Smokeless tobacco: Former Systems developer    Quit date: 10/29/1995  . Alcohol use No  . Drug use: No  . Sexual activity: Not Asked   Other Topics Concern  . None   Social History Narrative  . None   Additional Social History:    Allergies:   Allergies  Allergen Reactions  . Sulfa Antibiotics Hives  . Biaxin [Clarithromycin] Hives  . Eggs Or Egg-Derived Products Other (See Comments)    Pt states that her allergy test said she was allergic to eggs.    . Influenza A (H1n1) Monoval Vac Other (See Comments)    Pt states that she was told by her MD not to get  the influenza vaccine.    . Morphine Other (See Comments)    Reaction:  Dizziness and confusion   . Pyridium [Phenazopyridine Hcl] Other (See Comments)    Reaction:  Unknown   . Ceftriaxone Anxiety  . Latex Rash  . Prednisone Rash  . Tape Rash    Labs:  Results for orders placed or performed during the hospital encounter of 09/24/16 (from the past 48 hour(s))  Glucose, capillary     Status: Abnormal   Collection Time: 10/02/16  9:32 PM  Result Value Ref Range   Glucose-Capillary 129 (H) 65 - 99 mg/dL  Glucose, capillary     Status: Abnormal   Collection Time: 10/03/16  7:39 AM  Result Value Ref Range   Glucose-Capillary 107 (H) 65 - 99 mg/dL  Glucose, capillary     Status: None   Collection Time: 10/03/16 11:43 AM  Result Value Ref Range   Glucose-Capillary 94 65 - 99 mg/dL  Glucose, capillary     Status: None   Collection Time: 10/03/16  4:18 PM  Result Value Ref Range   Glucose-Capillary 96 65 - 99 mg/dL  Glucose, capillary     Status: Abnormal   Collection Time: 10/03/16  8:56 PM  Result Value Ref Range   Glucose-Capillary 110 (H) 65 - 99 mg/dL  Glucose, capillary     Status: None    Collection Time: 10/04/16  7:32 AM  Result Value Ref Range   Glucose-Capillary 68 65 - 99 mg/dL  Glucose, capillary     Status: None   Collection Time: 10/04/16  8:45 AM  Result Value Ref Range   Glucose-Capillary 83 65 - 99 mg/dL  Glucose, capillary     Status: Abnormal   Collection Time: 10/04/16 11:25 AM  Result Value Ref Range   Glucose-Capillary 123 (H) 65 - 99 mg/dL  Glucose, capillary     Status: Abnormal   Collection Time: 10/04/16  4:43 PM  Result Value Ref Range   Glucose-Capillary 152 (H) 65 - 99 mg/dL    Current Facility-Administered Medications  Medication Dose Route Frequency Provider Last Rate Last Dose  . acetaminophen (TYLENOL) tablet 650 mg  650 mg Oral Q6H PRN Gladstone Lighter, MD   650 mg at 09/27/16 0920   Or  . acetaminophen (TYLENOL) suppository 650 mg  650 mg Rectal Q6H PRN Gladstone Lighter, MD      . alum & mag hydroxide-simeth (MAALOX/MYLANTA) 200-200-20 MG/5ML suspension 30 mL  30 mL Oral Q4H PRN Theodoro Grist, MD   30 mL at 09/25/16 2359  . amLODipine (NORVASC) tablet 10 mg  10 mg Oral Daily Theodoro Grist, MD   10 mg at 10/04/16 1048  . ARIPiprazole (ABILIFY) tablet 2 mg  2 mg Oral Daily Gonzella Lex, MD      . atorvastatin (LIPITOR) tablet 20 mg  20 mg Oral QHS Gladstone Lighter, MD   20 mg at 10/03/16 2158  . citalopram (CELEXA) tablet 10 mg  10 mg Oral Daily Gonzella Lex, MD      . clonazePAM (KLONOPIN) tablet 0.5 mg  0.5 mg Oral BID Gonzella Lex, MD      . conjugated estrogens (PREMARIN) vaginal cream 1 Applicatorful  1 Applicatorful Vaginal Once per day on Mon Wed Fri Gladstone Lighter, MD   1 Applicatorful at 43/15/40 1005  . darifenacin (ENABLEX) 24 hr tablet 15 mg  15 mg Oral Daily Theodoro Grist, MD   15 mg at 10/04/16 1052  . desmopressin (DDAVP) tablet  0.2 mg  0.2 mg Oral QHS Gladstone Lighter, MD   0.2 mg at 10/03/16 2158  . docusate sodium (COLACE) capsule 100 mg  100 mg Oral BID Napoleon Form, RPH   100 mg at 10/02/16 2241  .  enoxaparin (LOVENOX) injection 40 mg  40 mg Subcutaneous Q12H Gladstone Lighter, MD   40 mg at 10/04/16 1048  . insulin aspart (novoLOG) injection 0-5 Units  0-5 Units Subcutaneous QHS Gladstone Lighter, MD      . insulin aspart (novoLOG) injection 0-9 Units  0-9 Units Subcutaneous TID WC Gladstone Lighter, MD   2 Units at 10/04/16 1718  . insulin glargine (LANTUS) injection 40 Units  40 Units Subcutaneous QHS Henreitta Leber, MD      . iron polysaccharides (NIFEREX) capsule 150 mg  150 mg Oral TID Gladstone Lighter, MD   150 mg at 10/02/16 2241  . loratadine (CLARITIN) tablet 10 mg  10 mg Oral Daily Gladstone Lighter, MD   10 mg at 10/01/16 1050  . metoCLOPramide (REGLAN) tablet 5 mg  5 mg Oral TID AC Theodoro Grist, MD   5 mg at 10/04/16 1720  . metoprolol succinate (TOPROL-XL) 24 hr tablet 50 mg  50 mg Oral BID Theodoro Grist, MD   50 mg at 10/04/16 1048  . nystatin (MYCOSTATIN/NYSTOP) topical powder   Topical TID Gladstone Lighter, MD      . ondansetron [facility]) tablet 4 mg  4 mg Oral Q6H PRN Gladstone Lighter, MD   4 mg at 09/29/16 1109   Or  . ondansetron (ZOFRAN) injection 4 mg  4 mg Intravenous Q6H PRN Gladstone Lighter, MD   4 mg at 09/27/16 0950  . pantoprazole (PROTONIX) EC tablet 40 mg  40 mg Oral BID Gladstone Lighter, MD   40 mg at 10/03/16 2158  . polyvinyl alcohol (LIQUIFILM TEARS) 1.4 % ophthalmic solution 1 drop  1 drop Both Eyes PRN Theodoro Grist, MD      . potassium chloride SA (K-DUR,KLOR-CON) CR tablet 20 mEq  20 mEq Oral Daily Gladstone Lighter, MD   Stopped at 10/02/16 1000  . senna (SENOKOT) tablet 8.6 mg  1 tablet Oral Daily Napoleon Form, RPH   8.6 mg at 10/04/16 1047  . vitamin E capsule 1,000 Units  1,000 Units Oral Daily Gladstone Lighter, MD   1,000 Units at 09/29/16 1028    Musculoskeletal: Strength & Muscle Tone: within normal limits Gait & Station: unable to stand Patient leans: N/A  Psychiatric Specialty Exam: Physical Exam  Nursing note and vitals  reviewed. Constitutional: She appears well-developed and well-nourished.  HENT:  Head: Normocephalic and atraumatic.  Eyes: Conjunctivae are normal. Pupils are equal, round, and reactive to light.  Neck: Normal range of motion.  Cardiovascular: Normal heart sounds.   Respiratory: Effort normal.  GI: Soft.  Musculoskeletal: Normal range of motion.  Neurological: She is alert.  Skin: Skin is warm and dry.  Psychiatric: Her affect is blunt and inappropriate. Her speech is delayed. She is withdrawn. She exhibits abnormal recent memory.    Review of Systems  Unable to perform ROS: Psychiatric disorder    Blood pressure (!) 149/57, pulse 77, temperature 98.5 F (36.9 C), resp. rate 20, height 5\' 8"  (1.727 m), weight 131.5 kg (290 lb), SpO2 93 %.Body mass index is 44.09 kg/m.  General Appearance: Fairly Groomed  Eye Contact:  Minimal  Speech:  Negative  Volume:  Decreased  Mood:  Negative  Affect:  Negative  Thought Process:  NA  Orientation:  Negative  Thought Content:  Negative  Suicidal Thoughts:  Unknown  Homicidal Thoughts:  Unknown  Memory:  Negative  Judgement:  Negative  Insight:  Negative  Psychomotor Activity:  Negative  Concentration:  Concentration: Negative  Recall:  Negative  Fund of Knowledge:  Negative  Language:  Negative  Akathisia:  Negative  Handed:  Right  AIMS (if indicated):     Assets:  Social Support  ADL's:  Impaired  Cognition:  Impaired,  Severe  Sleep:        Treatment Plan Summary: Daily contact with patient to assess and evaluate symptoms and progress in treatment, Medication management and Plan Remarkable change in this 75 year old woman. She is not communicating with me at all. Differential diagnosis would of course include a severe stroke, a delirium and psychiatric illness. There is no evidence that she has had a stroke. Her metabolic profile and vitals are pretty unremarkable. Really doesn't seem delirious. I suspect that this really is a  profound depression. Hadn't started her on a low dose of Celexa and Abilify. If she is ready for discharge in this condition I think she should be referred to a geriatric psychiatry hospital. I will follow-up as needed.  Disposition: Recommend psychiatric Inpatient admission when medically cleared. Supportive therapy provided about ongoing stressors.  Alethia Berthold, MD 10/04/2016 9:30 PM

## 2016-10-04 NOTE — Progress Notes (Signed)
At 0732 B.S. Was 78. After a lot of encouraging, Pt finally drank 4 ounces of OJ per protocol. After a recheck, B.S. Was 91.

## 2016-10-04 NOTE — Plan of Care (Signed)
Problem: Activity: Goal: Risk for activity intolerance will decrease Outcome: Not Progressing PT is bedfast.   Problem: Nutrition: Goal: Adequate nutrition will be maintained Outcome: Not Progressing Pt has a very poor appetite.

## 2016-10-04 NOTE — Progress Notes (Signed)
Palliative Medicine consult noted. Due to high referral volume, there may be a delay seeing this patient. Please call the Palliative Medicine Team office at [phone] if recommendations are needed in the interim.  Thank you for inviting [place] to see this patient.  Marjie Skiff Gillie Fleites, RN, BSN, [facility] 10/04/2016 11:55 AM Cell [phone] 8:00-4:00 Monday-Friday Office [phone]

## 2016-10-04 NOTE — Progress Notes (Signed)
[facility] [place] NAME: Carolyn Valdez    MR#:  [id number]  DATE OF BIRTH:  09-May-1941  SUBJECTIVE:   Patient tearful at times this morning. Sister at bedside. Still does not communicate well. No other acute events overnight. Palliative care consult placed to discuss goals of care. oral intake is poor.  REVIEW OF SYSTEMS:    Review of Systems  Unable to perform ROS: Mental acuity    Nutrition: Heart Healthy/Carb control Tolerating Diet: very poor Tolerating PT: Await [place] & bedbound   DRUG ALLERGIES:   Allergies  Allergen Reactions  . Sulfa Antibiotics Hives  . Biaxin [Clarithromycin] Hives  . Eggs Or Egg-Derived Products Other (See Comments)    Pt states that her allergy test said she was allergic to eggs.    . Influenza A (H1n1) Monoval Vac Other (See Comments)    Pt states that she was told by her MD not to get the influenza vaccine.    . Morphine Other (See Comments)    Reaction:  Dizziness and confusion   . Pyridium [Phenazopyridine Hcl] Other (See Comments)    Reaction:  Unknown   . Ceftriaxone Anxiety  . Latex Rash  . Prednisone Rash  . Tape Rash    VITALS:  Blood pressure (!) 172/80, pulse 78, temperature 98 F (36.7 C), temperature source Axillary, resp. rate 18, height 5\' 8"  (1.727 m), weight 131.5 kg (290 lb), SpO2 97 %.  PHYSICAL EXAMINATION:   Physical Exam  GENERAL:  75 y.o.-year-old obese patient lying in the bed non-verbal but in NAD.  EYES: Pupils equal, round, reactive to light.. No scleral icterus. Extraocular muscles intact.  HEENT: Head atraumatic, normocephalic. Oropharynx and nasopharynx clear. Dry oral Mucosa. NECK:  Supple, no jugular venous distention. No thyroid enlargement, no tenderness.  LUNGS: Normal breath sounds bilaterally, no wheezing, rales, rhonchi. No use of accessory muscles of respiration.  CARDIOVASCULAR: S1, S2 normal. No murmurs, rubs, or gallops.  ABDOMEN: Soft, nontender,  nondistended. Bowel sounds present. No organomegaly or mass.  EXTREMITIES: No cyanosis, clubbing, +1-2 edema b/l due to chronic venous stasis  NEUROLOGIC: Cranial nerves II through XII are intact. No focal Motor or sensory deficits b/l. Globally weak.   PSYCHIATRIC: The patient is alert and oriented x 1. Tearful & Flat affect. SKIN: No obvious rash, lesion, or ulcer.    LABORATORY PANEL:   CBC  Recent Labs Lab 10/02/16 0516  WBC 8.1  HGB 13.3  HCT 39.6  PLT 294   ------------------------------------------------------------------------------------------------------------------  Chemistries   Recent Labs Lab 10/02/16 0516  NA 135  K 3.6  CL 96*  CO2 30  GLUCOSE 92  BUN 12  CREATININE 0.86  CALCIUM 10.0  AST 34  ALT 16  ALKPHOS 59  BILITOT 0.9   ------------------------------------------------------------------------------------------------------------------  Cardiac Enzymes No results for input(s): TROPONINI in the last 168 hours. ------------------------------------------------------------------------------------------------------------------  RADIOLOGY:  No results found.   ASSESSMENT AND PLAN:   75 year old female with past medical history of diabetes, recurrent urinary tract infections, previous history of ESBL UTI, attention, hyperlipidemia, obesity, history of SVT, CHF, breast cancer who presented to the hospital due to altered mental status/delirium.  1. Altered mental status/delirium-etiology still unclear presently. Patient has had previous history of urinary tract infections but currently does not have a UTI. -No evidence of acute metabolic source. Seen by neurology and as per them suspect metabolic encephalopathy. EEG negative for any acute seizures. Patient used to be on Klonopin which was  suddenly stopped and will cont. Low dose Klonopin -Serologic workup so far has been negative.  2. Tearful/Flat affect - seems depressed.  - Psych following an  asked Dr. Weber Cooks to reevaluate pt today and change meds if needed.  3. DM Type II w/out complication - cont. Lantus but lower dose as PO intake is poor, cont. Novolog SS - BS stable.    3. Anxiety - cont. Klonopin  4. Hyperlipidemia - cont. Atorvastatin  5. HTN - cont. Norvasc, Toprol  6. GERD - cont. Protonix.   7. Hx of Diabetic Gastroparesis - cont. Reglan  Palliative care consult to discuss goals of care.   Greater than 50% of time spent in coordinating care and discussing plans with pt's sister.   All the records are reviewed and case discussed with Care Management/Social Worker. Management plans discussed with the patient, family and they are in agreement.  CODE STATUS: Full Code  DVT Prophylaxis: Lovenox  TOTAL TIME TAKING CARE OF THIS PATIENT: 35 minutes.   POSSIBLE D/C IN 2-3 DAYS, DEPENDING ON CLINICAL CONDITION.   Henreitta Leber M.D on 10/04/2016 at 3:41 PM  Between 7am to 6pm - Pager - [phone]  After 6pm go to [email or web address] - Technical brewer [place] [facility]  Office  [phone]  CC: Primary care physician; Leonel Ramsay, MD

## 2016-10-05 DIAGNOSIS — Z515 Encounter for palliative care: Secondary | ICD-10-CM

## 2016-10-05 DIAGNOSIS — Z66 Do not resuscitate: Secondary | ICD-10-CM

## 2016-10-05 DIAGNOSIS — F333 Major depressive disorder, recurrent, severe with psychotic symptoms: Secondary | ICD-10-CM

## 2016-10-05 LAB — VITAMIN B1: Vitamin B1 (Thiamine): 131.9 nmol/L (ref 66.5–200.0)

## 2016-10-05 LAB — GLUCOSE, CAPILLARY
GLUCOSE-CAPILLARY: 125 mg/dL — AB (ref 65–99)
Glucose-Capillary: 130 mg/dL — ABNORMAL HIGH (ref 65–99)
Glucose-Capillary: 145 mg/dL — ABNORMAL HIGH (ref 65–99)
Glucose-Capillary: 138 mg/dL — ABNORMAL HIGH (ref 65–99)

## 2016-10-05 MED ORDER — ENSURE ENLIVE PO LIQD
237.0000 mL | Freq: Two times a day (BID) | ORAL | Status: DC
  Administered 2016-10-05 – 2016-10-07 (×3): 237 mL via ORAL

## 2016-10-05 NOTE — Progress Notes (Signed)
[facility] NAME: Carolyn Valdez    MR#:  [id number]  DATE OF BIRTH:  August 16, 1941  SUBJECTIVE:   She was originally admitted to the hospital due to altered mental status, delirium, urinary retention. Mental status still is not back to baseline. Remains non-verbal and tearful at times.  No other acute events overnight.   REVIEW OF SYSTEMS:    Review of Systems  Unable to perform ROS: Mental acuity    Nutrition: Heart Healthy/Carb control Tolerating Diet: Yes Tolerating PT: Await Aval. & bedbound   DRUG ALLERGIES:   Allergies  Allergen Reactions  . Sulfa Antibiotics Hives  . Biaxin [Clarithromycin] Hives  . Eggs Or Egg-Derived Products Other (See Comments)    Pt states that her allergy test said she was allergic to eggs.    . Influenza A (H1n1) Monoval Vac Other (See Comments)    Pt states that she was told by her MD not to get the influenza vaccine.    . Morphine Other (See Comments)    Reaction:  Dizziness and confusion   . Pyridium [Phenazopyridine Hcl] Other (See Comments)    Reaction:  Unknown   . Ceftriaxone Anxiety  . Latex Rash  . Prednisone Rash  . Tape Rash    VITALS:  Blood pressure (!) 164/62, pulse 73, temperature 97.9 F (36.6 C), resp. rate 16, height 5\' 8"  (1.727 m), weight 131.5 kg (290 lb), SpO2 96 %.  PHYSICAL EXAMINATION:   Physical Exam  GENERAL:  75 y.o.-year-old obese patient lying in the bed non-verbal but in NAD.  EYES: Pupils equal, round, reactive to light.. No scleral icterus. Extraocular muscles intact.  HEENT: Head atraumatic, normocephalic. Oropharynx and nasopharynx clear. Dry oral Mucosa. NECK:  Supple, no jugular venous distention. No thyroid enlargement, no tenderness.  LUNGS: Normal breath sounds bilaterally, no wheezing, rales, rhonchi. No use of accessory muscles of respiration.  CARDIOVASCULAR: S1, S2 normal. No murmurs, rubs, or gallops.  ABDOMEN: Soft, nontender, nondistended.  Bowel sounds present. No organomegaly or mass.  EXTREMITIES: No cyanosis, clubbing, +1-2 edema b/l due to chronic venous stasis  NEUROLOGIC: Cranial nerves II through XII are intact. No focal Motor or sensory deficits b/l. Globally weak.   PSYCHIATRIC: The patient is alert and oriented x 1.  SKIN: No obvious rash, lesion, or ulcer.    LABORATORY PANEL:   CBC  Recent Labs Lab 10/02/16 0516  WBC 8.1  HGB 13.3  HCT 39.6  PLT 294   ------------------------------------------------------------------------------------------------------------------  Chemistries   Recent Labs Lab 10/02/16 0516  NA 135  K 3.6  CL 96*  CO2 30  GLUCOSE 92  BUN 12  CREATININE 0.86  CALCIUM 10.0  AST 34  ALT 16  ALKPHOS 59  BILITOT 0.9   ------------------------------------------------------------------------------------------------------------------  Cardiac Enzymes No results for input(s): TROPONINI in the last 168 hours. ------------------------------------------------------------------------------------------------------------------  RADIOLOGY:  No results found.   ASSESSMENT AND PLAN:   75 year old female with past medical history of diabetes, recurrent urinary tract infections, previous history of ESBL UTI, attention, hyperlipidemia, obesity, history of SVT, CHF, breast cancer who presented to the hospital due to altered mental status/delirium.  1. Altered mental status/delirium- Patient has had previous history of urinary tract infections but currently does not have a UTI. -No evidence of acute metabolic source. Seen by neurology and as per them suspect metabolic encephalopathy. EEG negative for any acute seizures. Patient used to be on Klonopin which was suddenly stopped and therefore has been  reinitiated. -Serologic workup so far has been negative. - this is mostly psychiatric in nature and reevaluated by Psych and they plan on looking for a geri-psych inpatient bed for her. Started  on Celexa and abilify as per Psych.  2. DM Type II w/out complication - cont. Lantus, Novolog SS - BS stable.    3. Anxiety - cont. Klonopin  4. Hyperlipidemia - cont. Atorvastatin  5. HTN - cont. Norvasc, Toprol  6. GERD - cont. Protonix.   7. Hx of Diabetic Gastroparesis - cont. Reglan  Appreciate Palliative care input to discuss goals of care with sister and pt. Is DNR.   All the records are reviewed and case discussed with Care Management/Social Worker. Management plans discussed with the patient, family and they are in agreement.  CODE STATUS: DNR  DVT Prophylaxis: Lovenox  TOTAL TIME TAKING CARE OF THIS PATIENT: 30 minutes.   POSSIBLE D/C IN 2-3 DAYS, DEPENDING ON CLINICAL CONDITION.   Henreitta Leber M.D on 10/05/2016 at 4:07 PM  Between 7am to 6pm - Pager - [phone]  After 6pm go to [email or web address] - [facility]  Office  [phone]  CC: Primary care physician; Leonel Ramsay, MD

## 2016-10-05 NOTE — Plan of Care (Signed)
Problem: Education: Goal: Knowledge of [facility] Education information/materials will improve Outcome: Not Progressing Patient is confused.  Problem: Health Behavior/Discharge Planning: Goal: Ability to manage health-related needs will improve Outcome: Not Progressing Patient is confused.  Problem: Activity: Goal: Risk for activity intolerance will decrease Outcome: Not Progressing Patient is not ambulating.

## 2016-10-05 NOTE — Consult Note (Signed)
Consultation Note Date: 10/05/2016   Patient Name: Carolyn Valdez  DOB: 1941/08/27  MRN: [id number]  Age / Sex: 75 y.o., female  PCP: Leonel Ramsay, MD Referring Physician: Henreitta Leber, MD  Reason for Consultation: Establishing goals of care and Psychosocial/spiritual support  HPI/Patient Profile: 75 y.o. female   with a known history of multiple medical problems including diastolic congestive heart failure, arthritis, bilateral lower extremity edema from venous insufficiency, multiple admissions for recurrent UTIs, hypertension, diabetes mellitus brought in from home after a recent discharge secondary to altered mental status.  Patient was recently discharged 10 days ago from the hospital after being treated for MRSA UTI with oral doxycycline. Her sister tells me less than 2 months ago this patient was living alone independently and still driving.  She had a short stay at a rehab with little success and then for a short period of time she was at home with in home caregivers.  Apparently over  the last several days she has had a profound change in her mental state.  Her eyes are open but she does not communicate, occasionally she speaks a few words, which are tangential.  She occasionally smiles and laughs inappropriately.  Mostly she looks generally distressed with furrowed brow.  So far none of the labs are work up shows anything that would account for this kind of profound change. MRI unremarkable. All the labs  unremarkable.  Per sister/[name] she does have a psych history reported as depression, anxiety, paranoia and suicidal ideations in the past.  Her sister remembers her as young as a teen having bouts of depression and "crying episodes' and at  other times episodes of extreme high times. She never recalls a psych hospitalization. Her sister remembers a Teacher, music named Dr Keenan Bachelor in the  past  Patient 's mother had a h/o depression and anxiety   There is no documented HPOA, she has no children and her next of kin is her only sibling her sister [name]   Clinical Assessment and Goals of Care:  SUMMARY OF RECOMMENDATIONS    Code Status/Advance Care Planning:  DNR- documented today, patient has a document stating desire for a natural death/in hard chart for scanning   Symptom Management:   Severe depression, single episode with psychotic features /per psychiatrist: continue Abilify and Celexa, Dr Weber Cooks to see this afternoon  Consider Geropsychiatry admission     Psycho-social/Spiritual:    Lengthy discussion with sister [name] and emotional support in a very difficult situation  Family encouraged to consider advanced directive decisions and anticipatory care needs.   Discharge Planning:    Discussed with Dr Weber Cooks.  This seems to be clearly a psychiatric acute situation.  Placement can be difficult especially in regards to her inability to perform ALDS, however this functional status is most likely impacted by her psychiatric condition.  Dr Weber Cooks plans to see this patient this afternoon and make further recommendations     Primary Diagnoses: Present on Admission: . Altered mental status  I have reviewed the medical record, interviewed the patient and family, and examined the patient. The following aspects are pertinent.  Past Medical History:  Diagnosis Date  . Anxiety   . Breast cancer ([place])    16 yrs ago  . CHF (congestive heart failure) ([place])   . Diabetes mellitus without complication ([place])   . DJD (degenerative joint disease)   . Dysuria   . H/O total knee replacement    right  . HTN (hypertension)   . Hypercholesteremia   . Kidney stone   . Obesity   . Recurrent urinary tract infection   . SVT (supraventricular tachycardia) ([place])   . Urinary incontinence   . Vaginal atrophy   . Yeast vaginitis    Social  History   Social History  . Marital status: Widowed    Spouse name: N/A  . Number of children: N/A  . Years of education: N/A   Social History Main Topics  . Smoking status: Former Research scientist (life sciences)  . Smokeless tobacco: Former Systems developer    Quit date: 10/29/1995  . Alcohol use No  . Drug use: No  . Sexual activity: Not Asked   Other Topics Concern  . None   Social History Narrative  . None   Family History  Problem Relation Age of Onset  . Lung cancer Father   . Hematuria Mother   . Lung cancer Mother   . Kidney disease Neg Hx   . Bladder Cancer Neg Hx   . Breast cancer Neg Hx    Scheduled Meds: . amLODipine  10 mg Oral Daily  . ARIPiprazole  2 mg Oral Daily  . atorvastatin  20 mg Oral QHS  . citalopram  10 mg Oral Daily  . clonazePAM  0.5 mg Oral BID  . conjugated estrogens  1 Applicatorful Vaginal Once per day on Mon Wed Fri  . darifenacin  15 mg Oral Daily  . desmopressin  0.2 mg Oral QHS  . docusate sodium  100 mg Oral BID  . enoxaparin (LOVENOX) injection  40 mg Subcutaneous Q12H  . insulin aspart  0-5 Units Subcutaneous QHS  . insulin aspart  0-9 Units Subcutaneous TID WC  . insulin glargine  40 Units Subcutaneous QHS  . iron polysaccharides  150 mg Oral TID  . loratadine  10 mg Oral Daily  . metoCLOPramide  5 mg Oral TID AC  . metoprolol succinate  50 mg Oral BID  . nystatin   Topical TID  . pantoprazole  40 mg Oral BID  . potassium chloride SA  20 mEq Oral Daily  . senna  1 tablet Oral Daily  . vitamin E  1,000 Units Oral Daily   Continuous Infusions: PRN Meds:.acetaminophen **OR** acetaminophen, alum & mag hydroxide-simeth, ondansetron **OR** ondansetron (ZOFRAN) IV, polyvinyl alcohol Medications Prior to Admission:  Prior to Admission medications   Medication Sig Start Date End Date Taking? Authorizing Provider  amLODipine (NORVASC) 2.5 MG tablet Take 1 tablet (2.5 mg total) by mouth daily. 09/05/16  Yes Epifanio Lesches, MD  atorvastatin (LIPITOR) 20 MG  tablet Take 20 mg by mouth at bedtime.   Yes Historical Provider, MD  clonazePAM (KLONOPIN) 1 MG tablet Take 1 mg by mouth 3 (three) times daily.   Yes Historical Provider, MD  conjugated estrogens (PREMARIN) vaginal cream Place 1 Applicatorful vaginally 3 (three) times a week. Pt uses on Monday, Wednesday, and Friday.   Yes Historical Provider, MD  desmopressin (DDAVP) 0.2 MG tablet Take 1 tablet (0.2 mg  total) by mouth at bedtime. 11/05/15  Yes Shannon A McGowan, PA-C  docusate sodium (COLACE) 100 MG capsule Take 200 mg by mouth 2 (two) times daily.   Yes Historical Provider, MD  fexofenadine (ALLEGRA) 180 MG tablet Take 180 mg by mouth daily.   Yes Historical Provider, MD  furosemide (LASIX) 20 MG tablet Take 1 tablet (20 mg total) by mouth daily. 09/05/16  Yes Epifanio Lesches, MD  gabapentin (NEURONTIN) 300 MG capsule Take 300-600 mg by mouth 3 (three) times daily. Pt takes two capsules in the morning, one capsule at lunch, and two capsules in the evening.   Yes Historical Provider, MD  hydrOXYzine (ATARAX/VISTARIL) 25 MG tablet Take 25 mg by mouth at bedtime.    Yes Historical Provider, MD  insulin aspart (NOVOLOG) 100 UNIT/ML injection Inject 0-20 Units into the skin 3 (three) times daily with meals. 09/05/16  Yes Epifanio Lesches, MD  insulin aspart (NOVOLOG) 100 UNIT/ML injection Inject 0-5 Units into the skin at bedtime. 09/05/16  Yes Epifanio Lesches, MD  insulin glargine (LANTUS) 100 UNIT/ML injection Inject 50 Units into the skin at bedtime.   Yes Historical Provider, MD  iron polysaccharides (NIFEREX) 150 MG capsule Take 150 mg by mouth 3 (three) times daily.    Yes Historical Provider, MD  metoprolol succinate (TOPROL-XL) 25 MG 24 hr tablet Take 1 tablet (25 mg total) by mouth 2 (two) times daily. 09/05/16  Yes Epifanio Lesches, MD  nystatin (MYCOSTATIN/NYSTOP) powder Apply topically 3 (three) times daily. 09/05/16  Yes Epifanio Lesches, MD  omeprazole (PRILOSEC) 40 MG  capsule Take 40 mg by mouth 2 (two) times daily.   Yes Historical Provider, MD  ondansetron (ZOFRAN ODT) 4 MG disintegrating tablet Take 1 tablet (4 mg total) by mouth every 8 (eight) hours as needed for nausea or vomiting. 09/11/16  Yes Harvest Dark, MD  pioglitazone (ACTOS) 30 MG tablet Take 30 mg by mouth daily.   Yes Historical Provider, MD  potassium chloride SA (K-DUR,KLOR-CON) 20 MEQ tablet Take 1 tablet (20 mEq total) by mouth daily. 08/13/16  Yes Lytle Butte, MD  VESICARE 10 MG tablet Take 1 tablet (10 mg total) by mouth daily. 11/05/15  Yes Shannon A McGowan, PA-C  vitamin E 1000 UNIT capsule Take 1,000 Units by mouth daily.   Yes Historical Provider, MD  amoxicillin-clavulanate (AUGMENTIN) 600-42.9 MG/5ML suspension Take 4.2 mLs (500 mg total) by mouth 2 (two) times daily. 09/29/16   Theodoro Grist, MD  gabapentin (NEURONTIN) 100 MG capsule Take 1 capsule (100 mg total) by mouth 3 (three) times daily as needed. 09/29/16   Theodoro Grist, MD  metoCLOPramide (REGLAN) 5 MG tablet Take 1 tablet (5 mg total) by mouth every 8 (eight) hours as needed for nausea or vomiting. 09/29/16   Theodoro Grist, MD  metoprolol succinate (TOPROL-XL) 50 MG 24 hr tablet Take 1 tablet (50 mg total) by mouth 2 (two) times daily. Take with or immediately following a meal. 09/29/16   Theodoro Grist, MD  senna (SENOKOT) 8.6 MG TABS tablet Take 1 tablet (8.6 mg total) by mouth daily. 09/30/16   Theodoro Grist, MD   Allergies  Allergen Reactions  . Sulfa Antibiotics Hives  . Biaxin [Clarithromycin] Hives  . Eggs Or Egg-Derived Products Other (See Comments)    Pt states that her allergy test said she was allergic to eggs.    . Influenza A (H1n1) Monoval Vac Other (See Comments)    Pt states that she was told by her MD not  to get the influenza vaccine.    . Morphine Other (See Comments)    Reaction:  Dizziness and confusion   . Pyridium [Phenazopyridine Hcl] Other (See Comments)    Reaction:  Unknown   .  Ceftriaxone Anxiety  . Latex Rash  . Prednisone Rash  . Tape Rash   Review of Systems  Unable to perform ROS: Mental status change    Physical Exam  Constitutional:  -obese elderly female  Cardiovascular: Normal rate, regular rhythm and normal heart sounds.   Pulmonary/Chest: She has decreased breath sounds in the right lower field and the left lower field.  Abdominal: Soft. Normal appearance and bowel sounds are normal.  Skin: Skin is warm and dry. There is pallor.  Psychiatric: Her affect is blunt. Her speech is delayed. She is slowed. Cognition and memory are impaired.  -patient looks around the room as if she is seeing something that is not there, said to her sister "there is something strange going on"    Vital Signs: BP (!) 162/66 (BP Location: Left Arm)   Pulse 80   Temp 98.1 F (36.7 C) (Axillary)   Resp 16   Ht 5\' 8"  (1.727 m)   Wt 131.5 kg (290 lb)   SpO2 96%   BMI 44.09 kg/m  Pain Assessment: No/denies pain   Pain Score: 0-No pain   SpO2: SpO2: 96 % O2 Device:SpO2: 96 % O2 Flow Rate: .O2 Flow Rate (L/min): 2 L/min  IO: Intake/output summary:  Intake/Output Summary (Last 24 hours) at 10/05/16 1018 Last data filed at 10/05/16 0817  Gross per 24 hour  Intake              120 ml  Output                0 ml  Net              120 ml    LBM: Last BM Date: 10/04/16 Baseline Weight: Weight: 131.5 kg (290 lb) Most recent weight: Weight: 131.5 kg (290 lb)      Palliative Assessment/Data: 30% at best   Discussed with Dr Weber Cooks  Dr Verdell Carmine  Time In: 1130 Time Out: 1330 Time Total: 120 min Greater than 50%  of this time was spent counseling and coordinating care related to the above assessment and plan.  Signed by: Wadie Lessen, NP   Please contact Palliative Medicine Team phone at [phone] for questions and concerns.  For individual provider: See Shea Evans

## 2016-10-05 NOTE — Consult Note (Signed)
[facility] Psychiatry Consult   Reason for Consult:  Consult for 75 year old woman seen previously a few days ago. Reconsult because of concern about depression Referring Physician:  Sain[place] Patient Identification: Carolyn Valdez MRN:  [id number] Principal Diagnosis: Severe major depression, single episode, with psychotic features [facility]) Diagnosis:   Patient Active Problem List   Diagnosis Date Noted  . DNR (do not resuscitate) [Z66] 10/05/2016  . Palliative care by specialist [Z51.5] 10/05/2016  . Depression, major, recurrent, severe with psychosis ([place]) [F33.3] 10/05/2016  . Severe major depression, single episode, with psychotic features ([place]) [F32.3] 10/04/2016  . Cellulitis of leg, right [L03.115] 09/29/2016  . Proctitis [K62.89] 09/29/2016  . Hypercapnia [R06.89] 09/29/2016  . Acute urinary retention [R33.8] 09/29/2016  . UTI (urinary tract infection) [N39.0] 09/29/2016  . Generalized weakness [R53.1] 09/29/2016  . Subacute delirium [F05] 09/28/2016  . Gastrointestinal hemorrhage [K92.2]   . Diffuse abdominal pain [R10.84]   . Altered mental status [R41.82] 09/24/2016  . Pressure injury of skin [L89.90] 09/02/2016  . Respiratory failure with hypoxia ([place]) [J96.91] 09/01/2016  . Lymphedema [I89.0] 08/16/2016  . CHF (congestive heart failure) ([place]) [I50.9] 08/09/2016  . Congestive heart failure ([place]) [I50.9] 11/25/2015  . Nocturia [R35.1] 11/05/2015  . Urinary frequency [R35.0] 11/05/2015  . Acute respiratory failure with hypoxia ([place]) [J96.01] 09/05/2015  . Recurrent UTI [N39.0] 04/20/2015  . Incontinence [R32] 04/20/2015  . Diabetes mellitus, type 2 ([place]) [E11.9] 04/17/2015  . ESBL (extended spectrum beta-lactamase) producing bacteria infection [A49.9, Z16.12] 04/17/2015  . BP (high blood pressure) [I10] 04/17/2015  . Frequent UTI [N39.0] 04/17/2015  . Absence of bladder continence [R32] 04/17/2015  . Iron deficiency anemia [D50.9] 03/08/2015  . Absolute anemia [D64.9]  09/25/2014  . Abdominal pain, lower [R10.30] 09/25/2014  . Urge incontinence [N39.41] 02/19/2013  . FOM (frequency of micturition) [R35.0] 02/19/2013  . Bladder infection, chronic [N30.20] 08/11/2012  . Difficult or painful urination [R30.0] 07/26/2012  . Lower urinary tract infection [N39.0] 12/31/2011  . Diabetes mellitus ([place]) [E11.9] 12/31/2011    Total Time spent with patient: 20 minutes  Subjective:   Carolyn Valdez is a 75 y.o. female patient admitted with patient was not able to give any information.  Follow-up for Tuesday the 19th. Patient seen again. Chart reviewed. After I spoke with the patient last night I had a conversation with her sister. Today I spoke with the nurse practitioner from palliative care. On interview this afternoon the patient is essentially the same as what I saw yesterday. She appears to be awake and makes some eye contact although not consistently. She spoke to words to maintain when I first spoke to her and then did not speak anymore for the rest of the time I was there although I ask her multiple questions and tried to give her information about the treatment plan. Affect looks anxious and withdrawn and depressed. I think the consensus is that the patient seems to of developed a severe depression with near catatonic or psychotic like symptoms. I have started her on medication.  HPI:  Tried to interview patient spent a fair bit of time trying to coax her into conversation. Chart reviewed. Labs reviewed. Spoke with her sister on the telephone. This 12 year old woman who I saw several days ago I was asked to reconsult on today. When I saw her a few days ago she seemed in fairly good shape. Apparently in the last several days she has had a profound change in her mental state. When I came to  see her this evening she appeared to be awake with her eyes open but did not communicate with me at all. She did say the word yes at one point when I ask her if she could speak but  then would not respond verbally to anything else. Furthermore she wouldn't respond to any of my requests that she move her hand or close her eyes or do anything else. She continued to keep her eyes open but did not really make much eye contact and instead just stared around the room. Affect appeared to be completely flat. She frequently would put her hands up on her head as though she were in some kind of distress. Apparently she has been not communicating with most of the staff over the last couple days. I spoke with her sister today however who said that the patient "talked all day". He said that the patient can communicate at times when she wants to but that she seems mentally to be very different than usual. So far none of the labs are work up shows anything that would account for this kind of profound change. MRI unremarkable. All the labs pretty unremarkable.  Social history: Patient at baseline lives alone. Sounds like is a fair bit of weird drama in the family according to what the sister described with a lot of drama and conflict between several members of the family.  Medical history: Multiple medical problems.  Substance abuse history: Nonidentified  Past Psychiatric History: There is nothing to document that the patient has had any severe depression in the past. No psychiatric hospitalization no history of psychosis no history of bipolar disorder. She was not on any psychiatric medicine except a chronic dose of Klonopin. Sister says she's never seen her relative be like this before.  Risk to Self: Is patient at risk for suicide?: No Risk to Others:   Prior Inpatient Therapy:   Prior Outpatient Therapy:    Past Medical History:  Past Medical History:  Diagnosis Date  . Anxiety   . Breast cancer ([place])    16 yrs ago  . CHF (congestive heart failure) ([place])   . Diabetes mellitus without complication ([place])   . DJD (degenerative joint disease)   . Dysuria   . H/O total knee replacement     right  . HTN (hypertension)   . Hypercholesteremia   . Kidney stone   . Obesity   . Recurrent urinary tract infection   . SVT (supraventricular tachycardia) ([place])   . Urinary incontinence   . Vaginal atrophy   . Yeast vaginitis     Past Surgical History:  Procedure Laterality Date  . CARDIAC CATHETERIZATION    . CHOLECYSTECTOMY    . FOOT SURGERY    . JOINT REPLACEMENT    . MASTECTOMY Right 1997  . NASAL SEPTUM SURGERY    . PERIPHERAL VASCULAR CATHETERIZATION N/A 07/08/2015   Procedure: PICC Line Insertion;  Surgeon: Algernon Huxley, MD;  Location: [facility];  Service: Cardiovascular;  Laterality: N/A;  . TONSILLECTOMY    . Vocal cords     Family History:  Family History  Problem Relation Age of Onset  . Lung cancer Father   . Hematuria Mother   . Lung cancer Mother   . Kidney disease Neg Hx   . Bladder Cancer Neg Hx   . Breast cancer Neg Hx    Family Psychiatric  History: Nothing identified Social History:  History  Alcohol Use No  History  Drug Use No    Social History   Social History  . Marital status: Widowed    Spouse name: N/A  . Number of children: N/A  . Years of education: N/A   Social History Main Topics  . Smoking status: Former Research scientist (life sciences)  . Smokeless tobacco: Former Systems developer    Quit date: 10/29/1995  . Alcohol use No  . Drug use: No  . Sexual activity: Not Asked   Other Topics Concern  . None   Social History Narrative  . None   Additional Social History:    Allergies:   Allergies  Allergen Reactions  . Sulfa Antibiotics Hives  . Biaxin [Clarithromycin] Hives  . Eggs Or Egg-Derived Products Other (See Comments)    Pt states that her allergy test said she was allergic to eggs.    . Influenza A (H1n1) Monoval Vac Other (See Comments)    Pt states that she was told by her MD not to get the influenza vaccine.    . Morphine Other (See Comments)    Reaction:  Dizziness and confusion   . Pyridium [Phenazopyridine Hcl] Other (See  Comments)    Reaction:  Unknown   . Ceftriaxone Anxiety  . Latex Rash  . Prednisone Rash  . Tape Rash    Labs:  Results for orders placed or performed during the hospital encounter of 09/24/16 (from the past 48 hour(s))  Glucose, capillary     Status: None   Collection Time: 10/03/16  4:18 PM  Result Value Ref Range   Glucose-Capillary 96 65 - 99 mg/dL  Glucose, capillary     Status: Abnormal   Collection Time: 10/03/16  8:56 PM  Result Value Ref Range   Glucose-Capillary 110 (H) 65 - 99 mg/dL  Glucose, capillary     Status: None   Collection Time: 10/04/16  7:32 AM  Result Value Ref Range   Glucose-Capillary 68 65 - 99 mg/dL  Glucose, capillary     Status: None   Collection Time: 10/04/16  8:45 AM  Result Value Ref Range   Glucose-Capillary 83 65 - 99 mg/dL  Glucose, capillary     Status: Abnormal   Collection Time: 10/04/16 11:25 AM  Result Value Ref Range   Glucose-Capillary 123 (H) 65 - 99 mg/dL  Glucose, capillary     Status: Abnormal   Collection Time: 10/04/16  4:43 PM  Result Value Ref Range   Glucose-Capillary 152 (H) 65 - 99 mg/dL  Glucose, capillary     Status: Abnormal   Collection Time: 10/04/16  9:40 PM  Result Value Ref Range   Glucose-Capillary 158 (H) 65 - 99 mg/dL  Glucose, capillary     Status: Abnormal   Collection Time: 10/05/16  7:40 AM  Result Value Ref Range   Glucose-Capillary 130 (H) 65 - 99 mg/dL  Glucose, capillary     Status: Abnormal   Collection Time: 10/05/16 11:35 AM  Result Value Ref Range   Glucose-Capillary 145 (H) 65 - 99 mg/dL    Current Facility-Administered Medications  Medication Dose Route Frequency Provider Last Rate Last Dose  . acetaminophen (TYLENOL) tablet 650 mg  650 mg Oral Q6H PRN Gladstone Lighter, MD   650 mg at 09/27/16 0920   Or  . acetaminophen (TYLENOL) suppository 650 mg  650 mg Rectal Q6H PRN Gladstone Lighter, MD      . alum & mag hydroxide-simeth (MAALOX/MYLANTA) 200-200-20 MG/5ML suspension 30 mL  30 mL  Oral Q4H PRN Theodoro Grist,  MD   30 mL at 09/25/16 2359  . amLODipine (NORVASC) tablet 10 mg  10 mg Oral Daily Theodoro Grist, MD   10 mg at 10/05/16 0841  . ARIPiprazole (ABILIFY) tablet 2 mg  2 mg Oral Daily Gonzella Lex, MD   2 mg at 10/05/16 0841  . atorvastatin (LIPITOR) tablet 20 mg  20 mg Oral QHS Gladstone Lighter, MD   20 mg at 10/04/16 2340  . citalopram (CELEXA) tablet 10 mg  10 mg Oral Daily Gonzella Lex, MD   10 mg at 10/05/16 0840  . clonazePAM (KLONOPIN) tablet 0.5 mg  0.5 mg Oral BID Gonzella Lex, MD   0.5 mg at 10/05/16 0841  . conjugated estrogens (PREMARIN) vaginal cream 1 Applicatorful  1 Applicatorful Vaginal Once per day on Mon Wed Fri Gladstone Lighter, MD   1 Applicatorful at 17/40/81 1005  . darifenacin (ENABLEX) 24 hr tablet 15 mg  15 mg Oral Daily Theodoro Grist, MD   15 mg at 10/04/16 1052  . desmopressin (DDAVP) tablet 0.2 mg  0.2 mg Oral QHS Gladstone Lighter, MD   0.2 mg at 10/04/16 2340  . docusate sodium (COLACE) capsule 100 mg  100 mg Oral BID Napoleon Form, RPH   100 mg at 10/04/16 2341  . enoxaparin (LOVENOX) injection 40 mg  40 mg Subcutaneous Q12H Gladstone Lighter, MD   40 mg at 10/05/16 0842  . feeding supplement (ENSURE ENLIVE) (ENSURE ENLIVE) liquid 237 mL  237 mL Oral BID BM Henreitta Leber, MD      . insulin aspart (novoLOG) injection 0-5 Units  0-5 Units Subcutaneous QHS Gladstone Lighter, MD      . insulin aspart (novoLOG) injection 0-9 Units  0-9 Units Subcutaneous TID WC Gladstone Lighter, MD   1 Units at 10/05/16 1212  . insulin glargine (LANTUS) injection 40 Units  40 Units Subcutaneous QHS Henreitta Leber, MD   40 Units at 10/04/16 2339  . iron polysaccharides (NIFEREX) capsule 150 mg  150 mg Oral TID Gladstone Lighter, MD   150 mg at 10/04/16 2347  . loratadine (CLARITIN) tablet 10 mg  10 mg Oral Daily Gladstone Lighter, MD   10 mg at 10/01/16 1050  . metoCLOPramide (REGLAN) tablet 5 mg  5 mg Oral TID AC Theodoro Grist, MD   5 mg at 10/04/16  1720  . metoprolol succinate (TOPROL-XL) 24 hr tablet 50 mg  50 mg Oral BID Theodoro Grist, MD   50 mg at 10/05/16 0841  . nystatin (MYCOSTATIN/NYSTOP) topical powder   Topical TID Gladstone Lighter, MD      . ondansetron [facility]) tablet 4 mg  4 mg Oral Q6H PRN Gladstone Lighter, MD   4 mg at 09/29/16 1109   Or  . ondansetron (ZOFRAN) injection 4 mg  4 mg Intravenous Q6H PRN Gladstone Lighter, MD   4 mg at 09/27/16 0950  . pantoprazole (PROTONIX) EC tablet 40 mg  40 mg Oral BID Gladstone Lighter, MD   40 mg at 10/05/16 0841  . polyvinyl alcohol (LIQUIFILM TEARS) 1.4 % ophthalmic solution 1 drop  1 drop Both Eyes PRN Theodoro Grist, MD      . potassium chloride SA (K-DUR,KLOR-CON) CR tablet 20 mEq  20 mEq Oral Daily Gladstone Lighter, MD   20 mEq at 10/05/16 0840  . senna (SENOKOT) tablet 8.6 mg  1 tablet Oral Daily Napoleon Form, RPH   8.6 mg at 10/05/16 0841  . vitamin E capsule 1,000 Units  1,000 Units Oral Daily Gladstone Lighter, MD   1,000 Units at 09/29/16 1028    Musculoskeletal: Strength & Muscle Tone: within normal limits Gait & Station: unable to stand Patient leans: N/A  Psychiatric Specialty Exam: Physical Exam  Nursing note and vitals reviewed. Constitutional: She appears well-developed and well-nourished.  HENT:  Head: Normocephalic and atraumatic.  Eyes: Conjunctivae are normal. Pupils are equal, round, and reactive to light.  Neck: Normal range of motion.  Cardiovascular: Normal heart sounds.   Respiratory: Effort normal.  GI: Soft.  Musculoskeletal: Normal range of motion.  Neurological: She is alert.  Skin: Skin is warm and dry.  Psychiatric: Her affect is blunt and inappropriate. Her speech is delayed. She is withdrawn. She exhibits abnormal recent memory.    Review of Systems  Unable to perform ROS: Psychiatric disorder    Blood pressure (!) 164/62, pulse 73, temperature 97.9 F (36.6 C), resp. rate 16, height 5\' 8"  (1.727 m), weight 131.5 kg (290 lb), SpO2  96 %.Body mass index is 44.09 kg/m.  General Appearance: Fairly Groomed  Eye Contact:  Minimal  Speech:  Negative  Volume:  Decreased  Mood:  Negative  Affect:  Negative  Thought Process:  NA  Orientation:  Negative  Thought Content:  Negative  Suicidal Thoughts:  Unknown  Homicidal Thoughts:  Unknown  Memory:  Negative  Judgement:  Negative  Insight:  Negative  Psychomotor Activity:  Negative  Concentration:  Concentration: Negative  Recall:  Negative  Fund of Knowledge:  Negative  Language:  Negative  Akathisia:  Negative  Handed:  Right  AIMS (if indicated):     Assets:  Social Support  ADL's:  Impaired  Cognition:  Impaired,  Severe  Sleep:        Treatment Plan Summary: Daily contact with patient to assess and evaluate symptoms and progress in treatment, Medication management and Plan Patient needs psychiatric admission. Recommend transfer to geriatric psychiatry unit. I will file commitment papers based on the patient's inability to make decisions for her or care for herself at this point and her poor oral intake. This will help to facilitate transfer plans. For now continue antidepressant and antipsychotic medication and I will follow-up as needed.  Disposition: Recommend psychiatric Inpatient admission when medically cleared. Supportive therapy provided about ongoing stressors.  Alethia Berthold, MD 10/05/2016 3:33 PM

## 2016-10-05 NOTE — Clinical Social Work Note (Signed)
CSW has noted psychiatry recommendation for geripsych unit. Also noted is that psychiatry intends to IVC patient but commitment papers are not yet on chart. CSW spoke with admissions at [facility] unit and was informed that if patient is total care due to catatonia and it is strongly felt that patient would recover, then they consider those total care patients on a case by case basis. She further stated that if patient has a high medical acuity and the total care is not a result of catatonia then patient would not be considered for admission. Shela Leff MSW,LCSW [phone]

## 2016-10-05 NOTE — Progress Notes (Signed)
Initial Nutrition Assessment  DOCUMENTATION CODES:   Morbid obesity  INTERVENTION:  -Recommend liberalizing diet to Regular as pt eating <25% of meals -Recommend addition of Magic cup TID with meals, each supplement provides 290 kcal and 9 grams of protein -Recommend Ensure Enlive po BID, each supplement provides 350 kcal and 20 grams of protein -Feeding assistance and encouragement at meal times -If po intake remains poor/inadequate and aggressive nutrition intervention is warranted, recommend nutrition support   NUTRITION DIAGNOSIS:   Inadequate oral intake related to acute illness (AMS) as evidenced by meal completion < 25%.  GOAL:   Patient will meet greater than or equal to 90% of their needs  MONITOR:   PO intake, Supplement acceptance, Labs, Weight trends  REASON FOR ASSESSMENT:   LOS    ASSESSMENT:    75 yo female admitted with AMS/delerium with unclear etiology. Pt with hx of DM, CHF, anxiety  Pt with speaking difficulty; on visit today, writer asked pt if she liked ice cream and pt stated "what do you think" with a smile. When asked if she likes vanilla ice cream, pt verbalized "yes."  Pt answered some questions appropriately and in other instances only able to say "I think...." Pt staring off into space at times, other times looking confused Pt with very poor appetite, eating 0-25% of meals.  Palliative care consult for goals of care, psych consulted as well  Unable to assess po intake prior admission at this time. Weight encounters relatively stable.  Nutrition-Focused physical exam completed. Findings are WDL for fat depletion, muscle depletion, and edema.   Labs: FSBS 60s-150s Meds: ss novolog, reglan, lantus  Diet Order: Heart Healthy/Carb Modified  Skin:   (no pressure ulcer (noted stage II pressure ulcer documented 1 month ago))  Last BM:  12/18  Height:   Ht Readings from Last 1 Encounters:  09/24/16 5\' 8"  (1.727 m)    Weight:   Wt  Readings from Last 1 Encounters:  09/24/16 290 lb (131.5 kg)    Wt Readings from Last 10 Encounters:  09/24/16 290 lb (131.5 kg)  09/11/16 290 lb (131.5 kg)  09/05/16 288 lb (130.6 kg)  08/09/16 (!) 318 lb 1.6 oz (144.3 kg)  02/23/16 (!) 317 lb (143.8 kg)  12/31/15 (!) 305 lb (138.3 kg)  11/24/15 (!) 306 lb 8 oz (139 kg)  11/05/15 292 lb 1.6 oz (132.5 kg)  10/07/15 293 lb (132.9 kg)  09/05/15 (!) 315 lb (142.9 kg)    BMI:  Body mass index is 44.09 kg/m.  Estimated Nutritional Needs:   Kcal:  1600-2000 calories  Protein:  100-140 g  Fluid:  >/= 1.6 L  EDUCATION NEEDS:   No education needs identified at this time  [place], [place], [place] [phone] Pager  [phone] Weekend/On-Call Pager

## 2016-10-06 LAB — GLUCOSE, CAPILLARY
GLUCOSE-CAPILLARY: 145 mg/dL — AB (ref 65–99)
GLUCOSE-CAPILLARY: 207 mg/dL — AB (ref 65–99)
GLUCOSE-CAPILLARY: 207 mg/dL — AB (ref 65–99)
Glucose-Capillary: 137 mg/dL — ABNORMAL HIGH (ref 65–99)
Glucose-Capillary: 173 mg/dL — ABNORMAL HIGH (ref 65–99)

## 2016-10-06 MED ORDER — ARIPIPRAZOLE 2 MG PO TABS
5.0000 mg | ORAL_TABLET | Freq: Every day | ORAL | Status: DC
  Administered 2016-10-07 – 2016-10-12 (×6): 5 mg via ORAL
  Filled 2016-10-06 (×6): qty 3

## 2016-10-06 MED ORDER — CITALOPRAM HYDROBROMIDE 20 MG PO TABS
20.0000 mg | ORAL_TABLET | Freq: Every day | ORAL | Status: DC
  Administered 2016-10-07 – 2016-10-12 (×6): 20 mg via ORAL
  Filled 2016-10-06 (×6): qty 1

## 2016-10-06 NOTE — Clinical Social Work Note (Signed)
As suspected, CSW talked with all three geri psych facilities referral was faxed to this morning: [facility], [facility], and [facility] and none of the facilities can take patient and stated patient is not appropriate for geri psych at this time due to patient not being able to take care of her needs and having more medical needs than they can accomodate. Shela Leff MSW,LCSW [phone]

## 2016-10-06 NOTE — Progress Notes (Signed)
[facility] [place] NAME: Carolyn Valdez    MR#:  [id number]  DATE OF BIRTH:  [birth year]  SUBJECTIVE:   A little more verbal this a.m. No other acute events overnight. Pt. Under IVC as per Psych.  They are looking for Geri-Psych bed.  REVIEW OF SYSTEMS:    Review of Systems  Unable to perform ROS: Mental acuity    Nutrition: Heart Healthy/Carb control Tolerating Diet: Yes Tolerating PT: Await [place] & bedbound   DRUG ALLERGIES:   Allergies  Allergen Reactions  . Sulfa Antibiotics Hives  . Biaxin [Clarithromycin] Hives  . Eggs Or Egg-Derived Products Other (See Comments)    Pt states that her allergy test said she was allergic to eggs.    . Influenza A (H1n1) Monoval Vac Other (See Comments)    Pt states that she was told by her MD not to get the influenza vaccine.    . Morphine Other (See Comments)    Reaction:  Dizziness and confusion   . Pyridium [Phenazopyridine Hcl] Other (See Comments)    Reaction:  Unknown   . Ceftriaxone Anxiety  . Latex Rash  . Prednisone Rash  . Tape Rash    VITALS:  Blood pressure (!) 134/52, pulse 71, temperature 98 F (36.7 C), temperature source Axillary, resp. rate 18, height 5\' 8"  (1.727 m), weight 131.5 kg (290 lb), SpO2 92 %.  PHYSICAL EXAMINATION:   Physical Exam  GENERAL:  75 y.o.-year-old obese patient lying in the bed in NAD.  EYES: Pupils equal, round, reactive to light.. No scleral icterus. Extraocular muscles intact.  HEENT: Head atraumatic, normocephalic. Oropharynx and nasopharynx clear. Moist oral Mucosa. NECK:  Supple, no jugular venous distention. No thyroid enlargement, no tenderness.  LUNGS: Normal breath sounds bilaterally, no wheezing, rales, rhonchi. No use of accessory muscles of respiration.  CARDIOVASCULAR: S1, S2 normal. No murmurs, rubs, or gallops.  ABDOMEN: Soft, nontender, nondistended. Bowel sounds present. No organomegaly or mass.  EXTREMITIES: No cyanosis,  clubbing, +1-2 edema b/l due to chronic venous stasis  NEUROLOGIC: Cranial nerves II through XII are intact. No focal Motor or sensory deficits b/l. Globally weak.   PSYCHIATRIC: The patient is alert and oriented x 1.  SKIN: No obvious rash, lesion, or ulcer.    LABORATORY PANEL:   CBC  Recent Labs Lab 10/02/16 0516  WBC 8.1  HGB 13.3  HCT 39.6  PLT 294   ------------------------------------------------------------------------------------------------------------------  Chemistries   Recent Labs Lab 10/02/16 0516  NA 135  K 3.6  CL 96*  CO2 30  GLUCOSE 92  BUN 12  CREATININE 0.86  CALCIUM 10.0  AST 34  ALT 16  ALKPHOS 59  BILITOT 0.9   ------------------------------------------------------------------------------------------------------------------  Cardiac Enzymes No results for input(s): TROPONINI in the last 168 hours. ------------------------------------------------------------------------------------------------------------------  RADIOLOGY:  No results found.   ASSESSMENT AND PLAN:   75 year old female with past medical history of diabetes, recurrent urinary tract infections, previous history of ESBL UTI, attention, hyperlipidemia, obesity, history of SVT, CHF, breast cancer who presented to the hospital due to altered mental status/delirium.  1. Altered mental status/delirium- Patient has had previous history of urinary tract infections but currently does not have a UTI. -No evidence of  Any other acute metabolic source. Seen by neurology and no acute Neurologic source.  EEG negative for any acute seizures. Patient used to be on Klonopin which was suddenly stopped and therefore has been reinitiated. - this is psychiatric in nature and reevaluated  by Psych and they are looking for a geri-psych inpatient bed for her.  - cont.  Celexa and abilify as per Psych.  2. DM Type II w/out complication - cont. Lantus, Novolog SS - BS stable.    3. Anxiety - cont.  Klonopin  4. Hyperlipidemia - cont. Atorvastatin  5. HTN - cont. Norvasc, Toprol  6. GERD - cont. Protonix.   7. Hx of Diabetic Gastroparesis - cont. Reglan  Appreciate Palliative care input to discuss goals of care with sister and pt. Is DNR.   All the records are reviewed and case discussed with Care Management/Social Worker. Management plans discussed with the patient, family and they are in agreement.  CODE STATUS: DNR  DVT Prophylaxis: Lovenox  TOTAL TIME TAKING CARE OF THIS PATIENT: 25 minutes.   POSSIBLE D/C IN 2-3 DAYS, DEPENDING ON CLINICAL CONDITION.   Henreitta Leber M.D on 10/06/2016 at 3:24 PM  Between 7am to 6pm - Pager - [phone]  After 6pm go to [email or web address] - [employer] [facility]  Office  [phone]  CC: Primary care physician; Leonel Ramsay, MD

## 2016-10-06 NOTE — Consult Note (Signed)
[facility] Consult   Reason for Consult:  Consult for 75 year old woman seen previously a few days ago. Reconsult because of concern about depression Referring Physician:  Sainani Patient Identification: Carolyn Valdez MRN:  [id number] Principal Diagnosis: Severe major depression, single episode, with psychotic features [facility]) Diagnosis:   Patient Active Problem List   Diagnosis Date Noted  . DNR (do not resuscitate) [Z66] 10/05/2016  . Palliative care by specialist [Z51.5] 10/05/2016  . Depression, major, recurrent, severe with psychosis ([place]) [F33.3] 10/05/2016  . Severe major depression, single episode, with psychotic features ([place]) [F32.3] 10/04/2016  . Cellulitis of leg, right [L03.115] 09/29/2016  . Proctitis [K62.89] 09/29/2016  . Hypercapnia [R06.89] 09/29/2016  . Acute urinary retention [R33.8] 09/29/2016  . UTI (urinary tract infection) [N39.0] 09/29/2016  . Generalized weakness [R53.1] 09/29/2016  . Subacute delirium [F05] 09/28/2016  . Gastrointestinal hemorrhage [K92.2]   . Diffuse abdominal pain [R10.84]   . Altered mental status [R41.82] 09/24/2016  . Pressure injury of skin [L89.90] 09/02/2016  . Respiratory failure with hypoxia ([place]) [J96.91] 09/01/2016  . Lymphedema [I89.0] 08/16/2016  . CHF (congestive heart failure) ([place]) [I50.9] 08/09/2016  . Congestive heart failure ([place]) [I50.9] 11/25/2015  . Nocturia [R35.1] 11/05/2015  . Urinary frequency [R35.0] 11/05/2015  . Acute respiratory failure with hypoxia ([place]) [J96.01] 09/05/2015  . Recurrent UTI [N39.0] 04/20/2015  . Incontinence [R32] 04/20/2015  . Diabetes mellitus, type 2 ([place]) [E11.9] 04/17/2015  . ESBL (extended spectrum beta-lactamase) producing bacteria infection [A49.9, Z16.12] 04/17/2015  . BP (high blood pressure) [I10] 04/17/2015  . Frequent UTI [N39.0] 04/17/2015  . Absence of bladder continence [R32] 04/17/2015  . Iron deficiency anemia [D50.9] 03/08/2015  . Absolute anemia [D64.9]  09/25/2014  . Abdominal pain, lower [R10.30] 09/25/2014  . Urge incontinence [N39.41] 02/19/2013  . FOM (frequency of micturition) [R35.0] 02/19/2013  . Bladder infection, chronic [N30.20] 08/11/2012  . Difficult or painful urination [R30.0] 07/26/2012  . Lower urinary tract infection [N39.0] 12/31/2011  . Diabetes mellitus ([place]) [E11.9] 12/31/2011    Total Time spent with patient: 20 minutes  Subjective:   Carolyn Valdez is a 75 y.o. female patient admitted with patient was not able to give any information.  Follow-up Wednesday the 20th. Patient seen. Spoke with social work. Spoke briefly with the sister although the phone line was then abruptly cutoff. Patient is a little different today than yesterday. She actually exchanged a few words with me. Made some sarcastic faces and I [place] at me. None of it made any sense. Patient apparently has been turned down a geriatric psychiatry hospitals on the basis of being "total care". No evidence of any side effects from medication.  HPI:  Tried to interview patient spent a fair bit of time trying to coax her into conversation. Chart reviewed. Labs reviewed. Spoke with her sister on the telephone. This 40 year old woman who I saw several days ago I was asked to reconsult on today. When I saw her a few days ago she seemed in fairly good shape. Apparently in the last several days she has had a profound change in her mental state. When I came to see her this evening she appeared to be awake with her eyes open but did not communicate with me at all. She did say the word yes at one point when I ask her if she could speak but then would not respond verbally to anything else. Furthermore she wouldn't respond to any of my requests that she move her hand or  close her eyes or do anything else. She continued to keep her eyes open but did not really make much eye contact and instead just stared around the room. Affect appeared to be completely flat. She frequently would  put her hands up on her head as though she were in some kind of distress. Apparently she has been not communicating with most of the staff over the last couple days. I spoke with her sister today however who said that the patient "talked all day". He said that the patient can communicate at times when she wants to but that she seems mentally to be very different than usual. So far none of the labs are work up shows anything that would account for this kind of profound change. MRI unremarkable. All the labs pretty unremarkable.  Social history: Patient at baseline lives alone. Sounds like is a fair bit of weird drama in the family according to what the sister described with a lot of drama and conflict between several members of the family.  Medical history: Multiple medical problems.  Substance abuse history: Nonidentified  Past Psychiatric History: There is nothing to document that the patient has had any severe depression in the past. No psychiatric hospitalization no history of psychosis no history of bipolar disorder. She was not on any psychiatric medicine except a chronic dose of Klonopin. Sister says she's never seen her relative be like this before.  Risk to Self: Is patient at risk for suicide?: No Risk to Others:   Prior Inpatient Therapy:   Prior Outpatient Therapy:    Past Medical History:  Past Medical History:  Diagnosis Date  . Anxiety   . Breast cancer ([place])    16 yrs ago  . CHF (congestive heart failure) ([place])   . Diabetes mellitus without complication ([place])   . DJD (degenerative joint disease)   . Dysuria   . H/O total knee replacement    right  . HTN (hypertension)   . Hypercholesteremia   . Kidney stone   . Obesity   . Recurrent urinary tract infection   . SVT (supraventricular tachycardia) ([place])   . Urinary incontinence   . Vaginal atrophy   . Yeast vaginitis     Past Surgical History:  Procedure Laterality Date  . CARDIAC CATHETERIZATION    .  CHOLECYSTECTOMY    . FOOT SURGERY    . JOINT REPLACEMENT    . MASTECTOMY Right 1997  . NASAL SEPTUM SURGERY    . PERIPHERAL VASCULAR CATHETERIZATION N/A 07/08/2015   Procedure: PICC Line Insertion;  Surgeon: Algernon Huxley, MD;  Location: [facility];  Service: Cardiovascular;  Laterality: N/A;  . TONSILLECTOMY    . Vocal cords     Family History:  Family History  Problem Relation Age of Onset  . Lung cancer Father   . Hematuria Mother   . Lung cancer Mother   . Kidney disease Neg Hx   . Bladder Cancer Neg Hx   . Breast cancer Neg Hx    Family Psychiatric  History: Nothing identified Social History:  History  Alcohol Use No     History  Drug Use No    Social History   Social History  . Marital status: Widowed    Spouse name: N/A  . Number of children: N/A  . Years of education: N/A   Social History Main Topics  . Smoking status: Former Research scientist (life sciences)  . Smokeless tobacco: Former Systems developer    Quit date: 10/29/1995  .  Alcohol use No  . Drug use: No  . Sexual activity: Not Asked   Other Topics Concern  . None   Social History Narrative  . None   Additional Social History:    Allergies:   Allergies  Allergen Reactions  . Sulfa Antibiotics Hives  . Biaxin [Clarithromycin] Hives  . Eggs Or Egg-Derived Products Other (See Comments)    Pt states that her allergy test said she was allergic to eggs.    . Influenza A (H1n1) Monoval Vac Other (See Comments)    Pt states that she was told by her MD not to get the influenza vaccine.    . Morphine Other (See Comments)    Reaction:  Dizziness and confusion   . Pyridium [Phenazopyridine Hcl] Other (See Comments)    Reaction:  Unknown   . Ceftriaxone Anxiety  . Latex Rash  . Prednisone Rash  . Tape Rash    Labs:  Results for orders placed or performed during the hospital encounter of 09/24/16 (from the past 48 hour(s))  Glucose, capillary     Status: Abnormal   Collection Time: 10/04/16  9:40 PM  Result Value Ref  Range   Glucose-Capillary 158 (H) 65 - 99 mg/dL  Glucose, capillary     Status: Abnormal   Collection Time: 10/05/16  7:40 AM  Result Value Ref Range   Glucose-Capillary 130 (H) 65 - 99 mg/dL  Glucose, capillary     Status: Abnormal   Collection Time: 10/05/16 11:35 AM  Result Value Ref Range   Glucose-Capillary 145 (H) 65 - 99 mg/dL  Glucose, capillary     Status: Abnormal   Collection Time: 10/05/16  4:39 PM  Result Value Ref Range   Glucose-Capillary 125 (H) 65 - 99 mg/dL  Glucose, capillary     Status: Abnormal   Collection Time: 10/05/16  9:26 PM  Result Value Ref Range   Glucose-Capillary 138 (H) 65 - 99 mg/dL  Glucose, capillary     Status: Abnormal   Collection Time: 10/06/16  8:03 AM  Result Value Ref Range   Glucose-Capillary 137 (H) 65 - 99 mg/dL  Glucose, capillary     Status: Abnormal   Collection Time: 10/06/16 12:08 PM  Result Value Ref Range   Glucose-Capillary 173 (H) 65 - 99 mg/dL  Glucose, capillary     Status: Abnormal   Collection Time: 10/06/16  5:08 PM  Result Value Ref Range   Glucose-Capillary 145 (H) 65 - 99 mg/dL    Current Facility-Administered Medications  Medication Dose Route Frequency Provider Last Rate Last Dose  . acetaminophen (TYLENOL) tablet 650 mg  650 mg Oral Q6H PRN Gladstone Lighter, MD   650 mg at 09/27/16 0920   Or  . acetaminophen (TYLENOL) suppository 650 mg  650 mg Rectal Q6H PRN Gladstone Lighter, MD      . alum & mag hydroxide-simeth (MAALOX/MYLANTA) 200-200-20 MG/5ML suspension 30 mL  30 mL Oral Q4H PRN Theodoro Grist, MD   30 mL at 09/25/16 2359  . amLODipine (NORVASC) tablet 10 mg  10 mg Oral Daily Theodoro Grist, MD   10 mg at 10/06/16 1122  . [START ON 10/07/2016] ARIPiprazole (ABILIFY) tablet 5 mg  5 mg Oral Daily Myracle Febres T Lorianna Spadaccini, MD      . atorvastatin (LIPITOR) tablet 20 mg  20 mg Oral QHS Gladstone Lighter, MD   20 mg at 10/05/16 2310  . [START ON 10/07/2016] citalopram (CELEXA) tablet 20 mg  20 mg Oral Daily Delaynie Stetzer T  Jenie Parish, MD      . clonazePAM (KLONOPIN) tablet 0.5 mg  0.5 mg Oral BID Gonzella Lex, MD   0.5 mg at 10/06/16 1123  . conjugated estrogens (PREMARIN) vaginal cream 1 Applicatorful  1 Applicatorful Vaginal Once per day on Mon Wed Fri Gladstone Lighter, MD   1 Applicatorful at 76/19/50 1005  . darifenacin (ENABLEX) 24 hr tablet 15 mg  15 mg Oral Daily Theodoro Grist, MD   15 mg at 10/04/16 1052  . desmopressin (DDAVP) tablet 0.2 mg  0.2 mg Oral QHS Gladstone Lighter, MD   0.2 mg at 10/05/16 2310  . docusate sodium (COLACE) capsule 100 mg  100 mg Oral BID Napoleon Form, RPH   100 mg at 10/05/16 2310  . enoxaparin (LOVENOX) injection 40 mg  40 mg Subcutaneous Q12H Gladstone Lighter, MD   40 mg at 10/06/16 1124  . feeding supplement (ENSURE ENLIVE) (ENSURE ENLIVE) liquid 237 mL  237 mL Oral BID BM Henreitta Leber, MD   237 mL at 10/06/16 1124  . insulin aspart (novoLOG) injection 0-5 Units  0-5 Units Subcutaneous QHS Gladstone Lighter, MD      . insulin aspart (novoLOG) injection 0-9 Units  0-9 Units Subcutaneous TID WC Gladstone Lighter, MD   2 Units at 10/06/16 1246  . insulin glargine (LANTUS) injection 40 Units  40 Units Subcutaneous QHS Henreitta Leber, MD   40 Units at 10/05/16 2312  . iron polysaccharides (NIFEREX) capsule 150 mg  150 mg Oral TID Gladstone Lighter, MD   150 mg at 10/05/16 2311  . loratadine (CLARITIN) tablet 10 mg  10 mg Oral Daily Gladstone Lighter, MD   10 mg at 10/01/16 1050  . metoCLOPramide (REGLAN) tablet 5 mg  5 mg Oral TID AC Theodoro Grist, MD   5 mg at 10/04/16 1720  . metoprolol succinate (TOPROL-XL) 24 hr tablet 50 mg  50 mg Oral BID Theodoro Grist, MD   50 mg at 10/06/16 1122  . nystatin (MYCOSTATIN/NYSTOP) topical powder   Topical TID Gladstone Lighter, MD      . ondansetron [facility]) tablet 4 mg  4 mg Oral Q6H PRN Gladstone Lighter, MD   4 mg at 09/29/16 1109   Or  . ondansetron (ZOFRAN) injection 4 mg  4 mg Intravenous Q6H PRN Gladstone Lighter, MD   4 mg at  09/27/16 0950  . pantoprazole (PROTONIX) EC tablet 40 mg  40 mg Oral BID Gladstone Lighter, MD   40 mg at 10/06/16 1125  . polyvinyl alcohol (LIQUIFILM TEARS) 1.4 % ophthalmic solution 1 drop  1 drop Both Eyes PRN Theodoro Grist, MD      . potassium chloride SA (K-DUR,KLOR-CON) CR tablet 20 mEq  20 mEq Oral Daily Gladstone Lighter, MD   20 mEq at 10/06/16 1125  . senna (SENOKOT) tablet 8.6 mg  1 tablet Oral Daily Napoleon Form, RPH   8.6 mg at 10/06/16 1122  . vitamin E capsule 1,000 Units  1,000 Units Oral Daily Gladstone Lighter, MD   1,000 Units at 09/29/16 1028    Musculoskeletal: Strength & Muscle Tone: within normal limits Gait & Station: unable to stand Patient leans: N/A  Psychiatric Specialty Exam: Physical Exam  Nursing note and vitals reviewed. Constitutional: She appears well-developed and well-nourished.  HENT:  Head: Normocephalic and atraumatic.  Eyes: Conjunctivae are normal. Pupils are equal, round, and reactive to light.  Neck: Normal range of motion.  Cardiovascular: Normal heart sounds.   Respiratory: Effort normal.  GI: Soft.  Musculoskeletal: Normal range of motion.  Neurological: She is alert.  Skin: Skin is warm and dry.  Psychiatric: Her affect is blunt and inappropriate. Her speech is delayed. She is withdrawn. She exhibits abnormal recent memory.    Review of Systems  Unable to perform ROS: Psychiatric disorder    Blood pressure (!) 134/52, pulse 71, temperature 98 F (36.7 C), temperature source Axillary, resp. rate 18, height 5\' 8"  (1.727 m), weight 131.5 kg (290 lb), SpO2 92 %.Body mass index is 44.09 kg/m.  General Appearance: Fairly Groomed  Eye Contact:  Minimal  Speech:  Negative  Volume:  Decreased  Mood:  Negative  Affect:  Negative  Thought Process:  NA  Orientation:  Negative  Thought Content:  Negative  Suicidal Thoughts:  Unknown  Homicidal Thoughts:  Unknown  Memory:  Negative  Judgement:  Negative  Insight:  Negative   Psychomotor Activity:  Negative  Concentration:  Concentration: Negative  Recall:  Negative  Fund of Knowledge:  Negative  Language:  Negative  Akathisia:  Negative  Handed:  Right  AIMS (if indicated):     Assets:  Social Support  ADL's:  Impaired  Cognition:  Impaired,  Severe  Sleep:        Treatment Plan Summary: Daily contact with patient to assess and evaluate symptoms and progress in treatment, Medication management and Plan 75 year old woman who appears to have a severe depression with catatonic like features. Treatable illness. Otherwise fairly stable. I would recommend that she receive aggressive psychiatric treatment including ECT. ECT initiation unfortunately not available until January 3 because of the holiday schedule. Meanwhile I am increasing her Abilify and Celexa. I conveyed all of this to her sister. I will continue to follow-up.  Disposition: Recommend psychiatric Inpatient admission when medically cleared. Supportive therapy provided about ongoing stressors.  Alethia Berthold, MD 10/06/2016 5:28 PM

## 2016-10-06 NOTE — Progress Notes (Signed)
   10/05/16 0618 10/05/16 0701 10/05/16 0841  Vitals  Temp 98.7 F (37.1 C) 98.6 F (37 C) 98.1 F (36.7 C)  Temp Source Oral Oral Axillary  BP (!) 181/70 (!) 173/71 (!) 162/66  MAP (mmHg) 96 96 90  BP Location Left Arm --  Left Arm  BP Method Automatic Automatic Automatic  Patient Position (if appropriate) --  --  Sitting  Pulse Rate 78 84 80  Pulse Rate Source Monitor Monitor Monitor  Resp 20 16 16   Oxygen Therapy  SpO2 94 % 96 % 96 %  O2 Device Room Air --  Room Air  Pain Assessment  Pain Assessment --  --  No/denies pain  Pain Score --  --  0  PCA/Epidural/Spinal Assessment  Respiratory Pattern --  --  --      10/05/16 0849 10/05/16 1209 10/05/16 2042  Vitals  Temp --  97.9 F (36.6 C) --   Temp Source --  --  --   BP --  (!) 164/62 (nurse notified Pryor Montes)) --   MAP (mmHg) --  87 --   BP Location --  Left Arm --   BP Method --  Automatic --   Patient Position (if appropriate) --  Lying --   Pulse Rate --  73 --   Pulse Rate Source --  Monitor --   Resp --  16 --   Oxygen Therapy  SpO2 --  96 % --   O2 Device --  Room Air --   Pain Assessment  Pain Assessment --  --  --   Pain Score --  --  --   PCA/Epidural/Spinal Assessment  Respiratory Pattern Regular;Unlabored --  Regular;Unlabored     10/05/16 2122  Vitals  Temp 98.3 F (36.8 C)  Temp Source Oral  BP (!) 172/76  MAP (mmHg) 95  BP Location Left Arm  BP Method Automatic  Patient Position (if appropriate) Lying  Pulse Rate 84  Pulse Rate Source Monitor  Resp 18  Oxygen Therapy  SpO2 91 %  O2 Device Room Air  Pain Assessment  Pain Assessment --   Pain Score --   PCA/Epidural/Spinal Assessment  Respiratory Pattern --

## 2016-10-06 NOTE — Clinical Social Work Note (Signed)
CSW has sent patient's referral to [facility], [facility], and [facility]. If patient is appropriate for GeriPsych, will expect to receive an acceptance from one of these facilities.  Shela Leff MSW,LCSW [phone]

## 2016-10-06 NOTE — Plan of Care (Signed)
Problem: Education: Goal: Knowledge of [facility] Education information/materials will improve Outcome: Not Progressing Patient is confused.  Problem: Activity: Goal: Risk for activity intolerance will decrease Outcome: Not Progressing Patient is not ambulating.  Problem: Fluid Volume: Goal: Ability to maintain a balanced intake and output will improve Outcome: Not Progressing Patient has to be prompted to eat.  Poor appetite.  Problem: Nutrition: Goal: Adequate nutrition will be maintained Outcome: Not Progressing Patient has to be prompted to eat.  Poor appetite.

## 2016-10-06 NOTE — Clinical Social Work Note (Signed)
CSW has notified Dr. Verdell Carmine, Dr. Weber Cooks, and [facility] with Palliative regarding the three geri psych dispositions.  Shela Leff MSW,LCSW [phone]

## 2016-10-07 LAB — GLUCOSE, CAPILLARY
GLUCOSE-CAPILLARY: 193 mg/dL — AB (ref 65–99)
Glucose-Capillary: 99 mg/dL (ref 65–99)
Glucose-Capillary: 165 mg/dL — ABNORMAL HIGH (ref 65–99)
Glucose-Capillary: 170 mg/dL — ABNORMAL HIGH (ref 65–99)
Glucose-Capillary: 166 mg/dL — ABNORMAL HIGH (ref 65–99)

## 2016-10-07 MED ORDER — ENSURE ENLIVE PO LIQD
237.0000 mL | Freq: Three times a day (TID) | ORAL | Status: DC
  Administered 2016-10-07 – 2016-10-25 (×44): 237 mL via ORAL

## 2016-10-07 NOTE — Progress Notes (Signed)
Nutrition Follow-up  DOCUMENTATION CODES:   Morbid obesity  INTERVENTION:  -Feeding Assistance: pt requires feeding assistance and frequent redirection at meal times -Continue Ensure Enlive po, increase to  TID, each supplement provides 350 kcal and 20 grams of protein -Continue Magic Cup -Recommend rechecking BMP; last BM 12/16 and pt with poor po intake including fluid intake  NUTRITION DIAGNOSIS:   Inadequate oral intake related to acute illness (AMS) as evidenced by meal completion < 25%.  Continues, Calorie Count in progress, supplements, feeding assistance  GOAL:   Patient will meet greater than or equal to 90% of their needs  MONITOR:   PO intake, Supplement acceptance, Labs, Weight trends  REASON FOR ASSESSMENT:   LOS    ASSESSMENT:    Pt alert this AM, talkative but not making much sense  Calorie count ordered by Palliative Care this AM. Pt with 1:1 sitter, under IVC per Psych, CSW looking for bed at geri-psych facility Noted psych recommending aggressive psychiatric treatment including CRT (not available until Jan 3rd due to holiday schedule)  Pt ate well with assistance by 1:1 sitter this AM. However po intake has been very poor since admission <25% of meals on average. Taking some Ensure  Labs: FSBS 99-200s, no recent BMP Meds: ss novolog, lantus, reglan  Diet Order: Regular  Skin:   (no pressure ulcer (noted stage II pressure ulcer documented 1 month ago))  Last BM:  12/20  Height:   Ht Readings from Last 1 Encounters:  09/24/16 5\' 8"  (1.727 m)    Weight:   Wt Readings from Last 1 Encounters:  09/24/16 290 lb (131.5 kg)    Ideal Body Weight:     BMI:  Body mass index is 44.09 kg/m.  Estimated Nutritional Needs:   Kcal:  1600-2000 calories  Protein:  100-140 g  Fluid:  >/= 1.6 L  EDUCATION NEEDS:   No education needs identified at this time  [place], [place], [place] [phone] Pager  [phone] Weekend/On-Call  Pager

## 2016-10-07 NOTE — Consult Note (Signed)
[facility] Consult   Reason for Consult:  Consult for 75 year old woman seen previously a few days ago. Reconsult because of concern about depression Referring Physician:  Sainani Patient Identification: Carolyn Valdez MRN:  [id number] Principal Diagnosis: Severe major depression, single episode, with psychotic features [facility]) Diagnosis:   Patient Active Problem List   Diagnosis Date Noted  . DNR (do not resuscitate) [Z66] 10/05/2016  . Palliative care by specialist [Z51.5] 10/05/2016  . Depression, major, recurrent, severe with psychosis ([place]) [F33.3] 10/05/2016  . Severe major depression, single episode, with psychotic features ([place]) [F32.3] 10/04/2016  . Cellulitis of leg, right [L03.115] 09/29/2016  . Proctitis [K62.89] 09/29/2016  . Hypercapnia [R06.89] 09/29/2016  . Acute urinary retention [R33.8] 09/29/2016  . UTI (urinary tract infection) [N39.0] 09/29/2016  . Generalized weakness [R53.1] 09/29/2016  . Subacute delirium [F05] 09/28/2016  . Gastrointestinal hemorrhage [K92.2]   . Diffuse abdominal pain [R10.84]   . Altered mental status [R41.82] 09/24/2016  . Pressure injury of skin [L89.90] 09/02/2016  . Respiratory failure with hypoxia ([place]) [J96.91] 09/01/2016  . Lymphedema [I89.0] 08/16/2016  . CHF (congestive heart failure) ([place]) [I50.9] 08/09/2016  . Congestive heart failure ([place]) [I50.9] 11/25/2015  . Nocturia [R35.1] 11/05/2015  . Urinary frequency [R35.0] 11/05/2015  . Acute respiratory failure with hypoxia ([place]) [J96.01] 09/05/2015  . Recurrent UTI [N39.0] 04/20/2015  . Incontinence [R32] 04/20/2015  . Diabetes mellitus, type 2 ([place]) [E11.9] 04/17/2015  . ESBL (extended spectrum beta-lactamase) producing bacteria infection [A49.9, Z16.12] 04/17/2015  . BP (high blood pressure) [I10] 04/17/2015  . Frequent UTI [N39.0] 04/17/2015  . Absence of bladder continence [R32] 04/17/2015  . Iron deficiency anemia [D50.9] 03/08/2015  . Absolute anemia [D64.9]  09/25/2014  . Abdominal pain, lower [R10.30] 09/25/2014  . Urge incontinence [N39.41] 02/19/2013  . FOM (frequency of micturition) [R35.0] 02/19/2013  . Bladder infection, chronic [N30.20] 08/11/2012  . Difficult or painful urination [R30.0] 07/26/2012  . Lower urinary tract infection [N39.0] 12/31/2011  . Diabetes mellitus ([place]) [E11.9] 12/31/2011    Total Time spent with patient: 20 minutes  Subjective:   Carolyn Valdez is a 75 y.o. female patient admitted with patient was not able to give any information.  Follow-up Thursday the 21st. Patient seen and this evening was significantly different from how she was yesterday. She interacted much more. Had something almost like a conversation for several minutes before becoming withdrawn again. Nevertheless she still seems disoriented and confused with an odd and inappropriate affect. She tells me however that she did eat today. Certainly she looks much more physically robust.  HPI:  Tried to interview patient spent a fair bit of time trying to coax her into conversation. Chart reviewed. Labs reviewed. Spoke with her sister on the telephone. This 89 year old woman who I saw several days ago I was asked to reconsult on today. When I saw her a few days ago she seemed in fairly good shape. Apparently in the last several days she has had a profound change in her mental state. When I came to see her this evening she appeared to be awake with her eyes open but did not communicate with me at all. She did say the word yes at one point when I ask her if she could speak but then would not respond verbally to anything else. Furthermore she wouldn't respond to any of my requests that she move her hand or close her eyes or do anything else. She continued to keep her eyes open but did  not really make much eye contact and instead just stared around the room. Affect appeared to be completely flat. She frequently would put her hands up on her head as though she were in some  kind of distress. Apparently she has been not communicating with most of the staff over the last couple days. I spoke with her sister today however who said that the patient "talked all day". He said that the patient can communicate at times when she wants to but that she seems mentally to be very different than usual. So far none of the labs are work up shows anything that would account for this kind of profound change. MRI unremarkable. All the labs pretty unremarkable.  Social history: Patient at baseline lives alone. Sounds like is a fair bit of weird drama in the family according to what the sister described with a lot of drama and conflict between several members of the family.  Medical history: Multiple medical problems.  Substance abuse history: Nonidentified  Past Psychiatric History: There is nothing to document that the patient has had any severe depression in the past. No psychiatric hospitalization no history of psychosis no history of bipolar disorder. She was not on any psychiatric medicine except a chronic dose of Klonopin. Sister says she's never seen her relative be like this before.  Risk to Self: Is patient at risk for suicide?: No Risk to Others:   Prior Inpatient Therapy:   Prior Outpatient Therapy:    Past Medical History:  Past Medical History:  Diagnosis Date  . Anxiety   . Breast cancer ([place])    16 yrs ago  . CHF (congestive heart failure) ([place])   . Diabetes mellitus without complication ([place])   . DJD (degenerative joint disease)   . Dysuria   . H/O total knee replacement    right  . HTN (hypertension)   . Hypercholesteremia   . Kidney stone   . Obesity   . Recurrent urinary tract infection   . SVT (supraventricular tachycardia) ([place])   . Urinary incontinence   . Vaginal atrophy   . Yeast vaginitis     Past Surgical History:  Procedure Laterality Date  . CARDIAC CATHETERIZATION    . CHOLECYSTECTOMY    . FOOT SURGERY    . JOINT REPLACEMENT    .  MASTECTOMY Right 1997  . NASAL SEPTUM SURGERY    . PERIPHERAL VASCULAR CATHETERIZATION N/A 07/08/2015   Procedure: PICC Line Insertion;  Surgeon: Algernon Huxley, MD;  Location: [facility];  Service: Cardiovascular;  Laterality: N/A;  . TONSILLECTOMY    . Vocal cords     Family History:  Family History  Problem Relation Age of Onset  . Lung cancer Father   . Hematuria Mother   . Lung cancer Mother   . Kidney disease Neg Hx   . Bladder Cancer Neg Hx   . Breast cancer Neg Hx    Family Psychiatric  History: Nothing identified Social History:  History  Alcohol Use No     History  Drug Use No    Social History   Social History  . Marital status: Widowed    Spouse name: N/A  . Number of children: N/A  . Years of education: N/A   Social History Main Topics  . Smoking status: Former Research scientist (life sciences)  . Smokeless tobacco: Former Systems developer    Quit date: 10/29/1995  . Alcohol use No  . Drug use: No  . Sexual activity: Not Asked  Other Topics Concern  . None   Social History Narrative  . None   Additional Social History:    Allergies:   Allergies  Allergen Reactions  . Sulfa Antibiotics Hives  . Biaxin [Clarithromycin] Hives  . Eggs Or Egg-Derived Products Other (See Comments)    Pt states that her allergy test said she was allergic to eggs.    . Influenza A (H1n1) Monoval Vac Other (See Comments)    Pt states that she was told by her MD not to get the influenza vaccine.    . Morphine Other (See Comments)    Reaction:  Dizziness and confusion   . Pyridium [Phenazopyridine Hcl] Other (See Comments)    Reaction:  Unknown   . Ceftriaxone Anxiety  . Latex Rash  . Prednisone Rash  . Tape Rash    Labs:  Results for orders placed or performed during the hospital encounter of 09/24/16 (from the past 48 hour(s))  Glucose, capillary     Status: Abnormal   Collection Time: 10/05/16  9:26 PM  Result Value Ref Range   Glucose-Capillary 138 (H) 65 - 99 mg/dL  Glucose, capillary      Status: Abnormal   Collection Time: 10/06/16  8:03 AM  Result Value Ref Range   Glucose-Capillary 137 (H) 65 - 99 mg/dL  Glucose, capillary     Status: Abnormal   Collection Time: 10/06/16 12:08 PM  Result Value Ref Range   Glucose-Capillary 173 (H) 65 - 99 mg/dL  Glucose, capillary     Status: Abnormal   Collection Time: 10/06/16  5:08 PM  Result Value Ref Range   Glucose-Capillary 145 (H) 65 - 99 mg/dL  Glucose, capillary     Status: Abnormal   Collection Time: 10/06/16  9:56 PM  Result Value Ref Range   Glucose-Capillary 207 (H) 65 - 99 mg/dL   Comment 1 Notify RN   Glucose, capillary     Status: Abnormal   Collection Time: 10/06/16 11:02 PM  Result Value Ref Range   Glucose-Capillary 207 (H) 65 - 99 mg/dL   Comment 1 Notify RN   Glucose, capillary     Status: None   Collection Time: 10/07/16  7:36 AM  Result Value Ref Range   Glucose-Capillary 99 65 - 99 mg/dL  Glucose, capillary     Status: Abnormal   Collection Time: 10/07/16 11:45 AM  Result Value Ref Range   Glucose-Capillary 193 (H) 65 - 99 mg/dL  Glucose, capillary     Status: Abnormal   Collection Time: 10/07/16  3:55 PM  Result Value Ref Range   Glucose-Capillary 165 (H) 65 - 99 mg/dL    Current Facility-Administered Medications  Medication Dose Route Frequency Provider Last Rate Last Dose  . acetaminophen (TYLENOL) tablet 650 mg  650 mg Oral Q6H PRN Gladstone Lighter, MD   650 mg at 09/27/16 0920   Or  . acetaminophen (TYLENOL) suppository 650 mg  650 mg Rectal Q6H PRN Gladstone Lighter, MD      . alum & mag hydroxide-simeth (MAALOX/MYLANTA) 200-200-20 MG/5ML suspension 30 mL  30 mL Oral Q4H PRN Theodoro Grist, MD   30 mL at 09/25/16 2359  . amLODipine (NORVASC) tablet 10 mg  10 mg Oral Daily Theodoro Grist, MD   10 mg at 10/07/16 1100  . ARIPiprazole (ABILIFY) tablet 5 mg  5 mg Oral Daily Gonzella Lex, MD   5 mg at 10/07/16 1059  . atorvastatin (LIPITOR) tablet 20 mg  20 mg Oral QHS  Gladstone Lighter, MD    20 mg at 10/06/16 2321  . citalopram (CELEXA) tablet 20 mg  20 mg Oral Daily Gonzella Lex, MD   20 mg at 10/07/16 1058  . clonazePAM (KLONOPIN) tablet 0.5 mg  0.5 mg Oral BID Gonzella Lex, MD   0.5 mg at 10/07/16 1101  . conjugated estrogens (PREMARIN) vaginal cream 1 Applicatorful  1 Applicatorful Vaginal Once per day on Mon Wed Fri Gladstone Lighter, MD   1 Applicatorful at 34/19/62 1005  . darifenacin (ENABLEX) 24 hr tablet 15 mg  15 mg Oral Daily Theodoro Grist, MD   15 mg at 10/07/16 1058  . desmopressin (DDAVP) tablet 0.2 mg  0.2 mg Oral QHS Gladstone Lighter, MD   0.2 mg at 10/06/16 2321  . docusate sodium (COLACE) capsule 100 mg  100 mg Oral BID Napoleon Form, RPH   100 mg at 10/07/16 1100  . enoxaparin (LOVENOX) injection 40 mg  40 mg Subcutaneous Q12H Gladstone Lighter, MD   40 mg at 10/07/16 1101  . feeding supplement (ENSURE ENLIVE) (ENSURE ENLIVE) liquid 237 mL  237 mL Oral TID BM Henreitta Leber, MD   237 mL at 10/07/16 1543  . insulin aspart (novoLOG) injection 0-5 Units  0-5 Units Subcutaneous QHS Gladstone Lighter, MD   2 Units at 10/06/16 2320  . insulin aspart (novoLOG) injection 0-9 Units  0-9 Units Subcutaneous TID WC Gladstone Lighter, MD   2 Units at 10/07/16 1641  . insulin glargine (LANTUS) injection 40 Units  40 Units Subcutaneous QHS Henreitta Leber, MD   40 Units at 10/06/16 2324  . iron polysaccharides (NIFEREX) capsule 150 mg  150 mg Oral TID Gladstone Lighter, MD   150 mg at 10/07/16 1543  . loratadine (CLARITIN) tablet 10 mg  10 mg Oral Daily Gladstone Lighter, MD   10 mg at 10/07/16 1059  . metoCLOPramide (REGLAN) tablet 5 mg  5 mg Oral TID AC Theodoro Grist, MD   5 mg at 10/07/16 1306  . metoprolol succinate (TOPROL-XL) 24 hr tablet 50 mg  50 mg Oral BID Theodoro Grist, MD   50 mg at 10/07/16 1100  . nystatin (MYCOSTATIN/NYSTOP) topical powder   Topical TID Gladstone Lighter, MD      . ondansetron [facility]) tablet 4 mg  4 mg Oral Q6H PRN Gladstone Lighter, MD   4  mg at 09/29/16 1109   Or  . ondansetron (ZOFRAN) injection 4 mg  4 mg Intravenous Q6H PRN Gladstone Lighter, MD   4 mg at 09/27/16 0950  . pantoprazole (PROTONIX) EC tablet 40 mg  40 mg Oral BID Gladstone Lighter, MD   40 mg at 10/07/16 1000  . polyvinyl alcohol (LIQUIFILM TEARS) 1.4 % ophthalmic solution 1 drop  1 drop Both Eyes PRN Theodoro Grist, MD      . potassium chloride SA (K-DUR,KLOR-CON) CR tablet 20 mEq  20 mEq Oral Daily Gladstone Lighter, MD   20 mEq at 10/07/16 1101  . senna (SENOKOT) tablet 8.6 mg  1 tablet Oral Daily Napoleon Form, RPH   8.6 mg at 10/07/16 1059  . vitamin E capsule 1,000 Units  1,000 Units Oral Daily Gladstone Lighter, MD   1,000 Units at 10/07/16 1100    Musculoskeletal: Strength & Muscle Tone: within normal limits Gait & Station: unable to stand Patient leans: N/A  Psychiatric Specialty Exam: Physical Exam  Nursing note and vitals reviewed. Constitutional: She appears well-developed and well-nourished.  HENT:  Head: Normocephalic and  atraumatic.  Eyes: Conjunctivae are normal. Pupils are equal, round, and reactive to light.  Neck: Normal range of motion.  Cardiovascular: Normal heart sounds.   Respiratory: Effort normal.  GI: Soft.  Musculoskeletal: Normal range of motion.  Neurological: She is alert.  Skin: Skin is warm and dry.  Psychiatric: Her affect is blunt and inappropriate. Her speech is delayed. She is withdrawn. She exhibits abnormal recent memory.    Review of Systems  Unable to perform ROS: Psychiatric disorder    Blood pressure (!) 152/63, pulse 71, temperature 99.5 F (37.5 C), temperature source Oral, resp. rate 18, height 5\' 8"  (1.727 m), weight 131.5 kg (290 lb), SpO2 92 %.Body mass index is 44.09 kg/m.  General Appearance: Fairly Groomed  Eye Contact:  Minimal  Speech:  Negative  Volume:  Decreased  Mood:  Negative  Affect:  Negative  Thought Process:  NA  Orientation:  Negative  Thought Content:  Negative  Suicidal  Thoughts:  Unknown  Homicidal Thoughts:  Unknown  Memory:  Negative  Judgement:  Negative  Insight:  Negative  Psychomotor Activity:  Negative  Concentration:  Concentration: Negative  Recall:  Negative  Fund of Knowledge:  Negative  Language:  Negative  Akathisia:  Negative  Handed:  Right  AIMS (if indicated):     Assets:  Social Support  ADL's:  Impaired  Cognition:  Impaired,  Severe  Sleep:        Treatment Plan Summary: Daily contact with patient to assess and evaluate symptoms and progress in treatment, Medication management and Plan Patient with psychiatric illness. I think she is a little bit better and I still think she is very unlikely to require total care in the long-term. Strongly recommend referral to geriatric psychiatry. I will continue to follow-up as needed. No change to medication for today.  Disposition: Recommend psychiatric Inpatient admission when medically cleared. Supportive therapy provided about ongoing stressors.  Alethia Berthold, MD 10/07/2016 6:05 PM

## 2016-10-07 NOTE — Progress Notes (Signed)
[facility] [place] NAME: Carolyn Valdez    MR#:  [id number]  DATE OF BIRTH:  July 16, 1941  SUBJECTIVE:   [facility] facility did not accept pt. As per Psych they are thinking about starting ECT on the patient but not until beginning of next year.  Pt. Continues to talk intermittently.   REVIEW OF SYSTEMS:    Review of Systems  Unable to perform ROS: Mental acuity    Nutrition: Heart Healthy/Carb control Tolerating Diet: Yes Tolerating PT: Await [place] & bedbound   DRUG ALLERGIES:   Allergies  Allergen Reactions  . Sulfa Antibiotics Hives  . Biaxin [Clarithromycin] Hives  . Eggs Or Egg-Derived Products Other (See Comments)    Pt states that her allergy test said she was allergic to eggs.    . Influenza A (H1n1) Monoval Vac Other (See Comments)    Pt states that she was told by her MD not to get the influenza vaccine.    . Morphine Other (See Comments)    Reaction:  Dizziness and confusion   . Pyridium [Phenazopyridine Hcl] Other (See Comments)    Reaction:  Unknown   . Ceftriaxone Anxiety  . Latex Rash  . Prednisone Rash  . Tape Rash    VITALS:  Blood pressure (!) 152/63, pulse 71, temperature 99.5 F (37.5 C), temperature source Oral, resp. rate 18, height 5\' 8"  (1.727 m), weight 131.5 kg (290 lb), SpO2 92 %.  PHYSICAL EXAMINATION:   Physical Exam  GENERAL:  75 y.o.-year-old obese patient lying in the bed in NAD.  EYES: Pupils equal, round, reactive to light.. No scleral icterus. Extraocular muscles intact.  HEENT: Head atraumatic, normocephalic. Oropharynx and nasopharynx clear. Moist oral Mucosa. NECK:  Supple, no jugular venous distention. No thyroid enlargement, no tenderness.  LUNGS: Normal breath sounds bilaterally, no wheezing, rales, rhonchi. No use of accessory muscles of respiration.  CARDIOVASCULAR: S1, S2 normal. No murmurs, rubs, or gallops.  ABDOMEN: Soft, nontender, nondistended. Bowel sounds present. No  organomegaly or mass.  EXTREMITIES: No cyanosis, clubbing, +1-2 edema b/l due to chronic venous stasis  NEUROLOGIC: Cranial nerves II through XII are intact. No focal Motor or sensory deficits b/l. Globally weak.   PSYCHIATRIC: The patient is alert and oriented x 1.  SKIN: No obvious rash, lesion, or ulcer.    LABORATORY PANEL:   CBC  Recent Labs Lab 10/02/16 0516  WBC 8.1  HGB 13.3  HCT 39.6  PLT 294   ------------------------------------------------------------------------------------------------------------------  Chemistries   Recent Labs Lab 10/02/16 0516  NA 135  K 3.6  CL 96*  CO2 30  GLUCOSE 92  BUN 12  CREATININE 0.86  CALCIUM 10.0  AST 34  ALT 16  ALKPHOS 59  BILITOT 0.9   ------------------------------------------------------------------------------------------------------------------  Cardiac Enzymes No results for input(s): TROPONINI in the last 168 hours. ------------------------------------------------------------------------------------------------------------------  RADIOLOGY:  No results found.   ASSESSMENT AND PLAN:   75 year old female with past medical history of diabetes, recurrent urinary tract infections, previous history of ESBL UTI, attention, hyperlipidemia, obesity, history of SVT, CHF, breast cancer who presented to the hospital due to altered mental status/delirium.  1. Altered mental status/delirium- Psychogenic in nature and likely related to severe depression. - Patient has had previous history of urinary tract infections but currently does not have a UTI. -No evidence of  Any other acute metabolic source. Seen by neurology and no acute Neurologic source.  EEG negative for any acute seizures.  -Continue Klonopin. Geri-psych so  these did not accept the patient and she is total care. -Continue Abilify, Celexa. As per psychiatry they're considering starting the patient on ECT.   2. DM Type II w/out complication - cont. Lantus,  Novolog SS - BS stable.    3. Anxiety - cont. Klonopin  4. Hyperlipidemia - cont. Atorvastatin  5. HTN - cont. Norvasc, Toprol  6. GERD - cont. Protonix.   7. Hx of Diabetic Gastroparesis - cont. Reglan  Appreciate Palliative care input to discuss goals of care with sister and pt. Is DNR.   All the records are reviewed and case discussed with Care Management/Social Worker. Management plans discussed with the patient, family and they are in agreement.  CODE STATUS: DNR  DVT Prophylaxis: Lovenox  TOTAL TIME TAKING CARE OF THIS PATIENT: 25 minutes.   POSSIBLE D/C IN 2-3 DAYS, DEPENDING ON CLINICAL CONDITION.   Henreitta Leber M.D on 10/07/2016 at 3:52 PM  Between 7am to 6pm - Pager - [phone]  After 6pm go to [email or web address] - Technical brewer  Hospitalists  Office  [phone]  CC: Primary care physician; Leonel Ramsay, MD

## 2016-10-07 NOTE — Clinical Social Work Note (Signed)
Patient's sister asked to speak with CSW this afternoon to find out where her sister was going. CSW visited patient's room and spoke with patient's sister who was at bedside. CSW informed patient's sister that the three geripsych facilities declined patient stating that she was not appropriate. While in patient's room CSW witnessed patient interacting with her sister and talking to the sitter but patient was confused thinking she was in her home and asking for insulin from the sitter. Patient was not catatonic at time of CSW visit. Patient was not oriented to person, place, time, or situation. Patient's sister was not happy that the geripsych facilities turned patient down and expressed her frustration to CSW. Patient's sister reported to [place] today that patient has been ambulatory and able to care for herself up until 3 weeks ago. When CSW pressed further, stating that this was a very different timeline than what her caregiver shared with RN CM and with what the rehab facility reported, patient's sister's timeline of when patient was no longer able to care for herself changed to 6 weeks ago and then again to 8 weeks ago later in the conversation. Patient's sister states that patient's inability to care for herself was all due to mental illness.   CSW was able to contact patient's home health nurse who stated patient has been total care for at least 8 weeks and that a hoyer lift has had to be utilized this entire time. She stated patient enjoyed having someone wait on her and do everything for her. CSW reviewed patient's past medical record and discovered that back in October during a hospital admission, patient was only able to ambulate 2 feet with PT and was then sent to rehab at [facility] where she did not participate with PT. CSW was also able to see that a year ago patient was ambulating 120 feet with PT and has steadily declined since that time.  Shela Leff MSW,LCSW [phone]

## 2016-10-07 NOTE — Plan of Care (Signed)
Problem: Education: Goal: Knowledge of [place] [facility] Education information/materials will improve Outcome: Not Progressing Patient is confused.  Problem: Health Behavior/Discharge Planning: Goal: Ability to manage health-related needs will improve Outcome: Not Progressing Patient is confused.  Problem: Activity: Goal: Risk for activity intolerance will decrease Outcome: Not Progressing Patient is not ambulatory.  Problem: Fluid Volume: Goal: Ability to maintain a balanced intake and output will improve Outcome: Not Progressing Patient has a poor appetite.  Problem: Nutrition: Goal: Adequate nutrition will be maintained Outcome: Not Progressing Patient has a poor appetite.

## 2016-10-08 LAB — CBC
HCT: 38.2 % (ref 35.0–47.0)
HEMOGLOBIN: 13 g/dL (ref 12.0–16.0)
MCH: 27.6 pg (ref 26.0–34.0)
MCHC: 34 g/dL (ref 32.0–36.0)
MCV: 81.2 fL (ref 80.0–100.0)
Platelets: 239 10*3/uL (ref 150–440)
RBC: 4.71 MIL/uL (ref 3.80–5.20)
RDW: 16.8 % — ABNORMAL HIGH (ref 11.5–14.5)
WBC: 6.8 10*3/uL (ref 3.6–11.0)

## 2016-10-08 LAB — CULTURE, BLOOD (ROUTINE X 2): CULTURE: NO GROWTH

## 2016-10-08 LAB — GLUCOSE, CAPILLARY
GLUCOSE-CAPILLARY: 205 mg/dL — AB (ref 65–99)
Glucose-Capillary: 141 mg/dL — ABNORMAL HIGH (ref 65–99)
Glucose-Capillary: 198 mg/dL — ABNORMAL HIGH (ref 65–99)
Glucose-Capillary: 214 mg/dL — ABNORMAL HIGH (ref 65–99)

## 2016-10-08 LAB — CREATININE, SERUM
Creatinine, Ser: 1.04 mg/dL — ABNORMAL HIGH (ref 0.44–1.00)
GFR, EST NON AFRICAN AMERICAN: 52 mL/min — AB (ref >60)

## 2016-10-08 MED ORDER — CLONAZEPAM 0.5 MG PO TABS
0.2500 mg | ORAL_TABLET | Freq: Two times a day (BID) | ORAL | Status: DC
  Administered 2016-10-08 – 2016-10-26 (×31): 0.25 mg via ORAL
  Filled 2016-10-08 (×32): qty 1

## 2016-10-08 NOTE — Clinical Social Work Note (Signed)
Patient at this point continues to not ambulate. Patient is total care at this time. Patient has been declined by 3 geripsych facilities. CSW will continue to follow. Shela Leff MSW,LCSW [phone]

## 2016-10-08 NOTE — Progress Notes (Signed)
Calorie Count Note  48 hour calorie count ordered.  Diet: Regular Supplements: EnsureEnlive TID, Magic Cup TID  Breakfast: 450 kcals, 13 g protein; ate 100% of grits, 50% of fruit, 25% eggs and bacon and 100% of coffee and orange juice; pt was assisted by 1:1 sitter and redirected at meal time Lunch: nothing recorded Dinner: nothing recorded for calorie count, per flow sheet, pt refused dinner tray Supplements: no documentation on calorie count, but per MAR, pt drank 237 mL x 2 of EnsureEnlive (700 kcals, 40 g protein)-unsure if pt truly drank 100% of both supplements, just documented that it was given  Total intake: (if pt drinks 2 Ensure) 1150 kcal (70% of minimum estimated needs) 1600-2000 kcal goal 63 protein (79% of minimum estimated needs) 80-110 grams of protein  Nutrition Dx: Inadequate oral intake related to acute illness (AMS) as evidenced by poor appetite, meal completion <50%  Goal: Meet >/= 80% nutritional needs  Intervention:  1) Strict feeding assistance, encouragement and redirection at all meal periods 2) Continue Ensure.Enlive TID between meals (each supplement provides 350 kcals, 20 g protein) and Magic Cup TID on meal trays (each provides 290 kcals, 9 g protein) 3) Discussed documentation of calorie count with nursing; instructions for calorie count in order set  [place], [place], [place] [phone] Pager  [phone] Weekend/On-Call Pager

## 2016-10-08 NOTE — Consult Note (Signed)
[facility] Consult   Reason for Consult:  Consult for 75 year old woman seen previously a few days ago. Reconsult because of concern about depression Referring Physician:  Sainani Patient Identification: Carolyn Valdez MRN:  [id number] Principal Diagnosis: Severe major depression, single episode, with psychotic features [facility]) Diagnosis:   Patient Active Problem List   Diagnosis Date Noted  . DNR (do not resuscitate) [Z66] 10/05/2016  . Palliative care by specialist [Z51.5] 10/05/2016  . Depression, major, recurrent, severe with psychosis ([place]) [F33.3] 10/05/2016  . Severe major depression, single episode, with psychotic features ([place]) [F32.3] 10/04/2016  . Cellulitis of leg, right [L03.115] 09/29/2016  . Proctitis [K62.89] 09/29/2016  . Hypercapnia [R06.89] 09/29/2016  . Acute urinary retention [R33.8] 09/29/2016  . UTI (urinary tract infection) [N39.0] 09/29/2016  . Generalized weakness [R53.1] 09/29/2016  . Subacute delirium [F05] 09/28/2016  . Gastrointestinal hemorrhage [K92.2]   . Diffuse abdominal pain [R10.84]   . Altered mental status [R41.82] 09/24/2016  . Pressure injury of skin [L89.90] 09/02/2016  . Respiratory failure with hypoxia ([place]) [J96.91] 09/01/2016  . Lymphedema [I89.0] 08/16/2016  . CHF (congestive heart failure) ([place]) [I50.9] 08/09/2016  . Congestive heart failure ([place]) [I50.9] 11/25/2015  . Nocturia [R35.1] 11/05/2015  . Urinary frequency [R35.0] 11/05/2015  . Acute respiratory failure with hypoxia ([place]) [J96.01] 09/05/2015  . Recurrent UTI [N39.0] 04/20/2015  . Incontinence [R32] 04/20/2015  . Diabetes mellitus, type 2 ([place]) [E11.9] 04/17/2015  . ESBL (extended spectrum beta-lactamase) producing bacteria infection [A49.9, Z16.12] 04/17/2015  . BP (high blood pressure) [I10] 04/17/2015  . Frequent UTI [N39.0] 04/17/2015  . Absence of bladder continence [R32] 04/17/2015  . Iron deficiency anemia [D50.9] 03/08/2015  . Absolute anemia [D64.9]  09/25/2014  . Abdominal pain, lower [R10.30] 09/25/2014  . Urge incontinence [N39.41] 02/19/2013  . FOM (frequency of micturition) [R35.0] 02/19/2013  . Bladder infection, chronic [N30.20] 08/11/2012  . Difficult or painful urination [R30.0] 07/26/2012  . Lower urinary tract infection [N39.0] 12/31/2011  . Diabetes mellitus ([place]) [E11.9] 12/31/2011    Total Time spent with patient: 20 minutes  Subjective:   Carolyn Valdez is a 75 y.o. female patient admitted with patient was not able to give any information. Follow-up for Friday the 22nd. Patient seen. Chart reviewed. Also spoke with the nursing assistant on duty. Patient herself presented once again in an odd manner. She made a few comments to me several times looking like she was being sarcastic but none of it really made any sense. I tried my best to be coaxing with her to get her to have some kind of communication that it just didn't seem to be possible for her. It sounds like she is eating a little bit more. The patient herself insists that she is fine and would be able to take care of herself at home. I pointed out to her that she is not ambulating or taking care of herself. Affect is still odd withdrawn and bizarre much of the time.  HPI:  Tried to interview patient spent a fair bit of time trying to coax her into conversation. Chart reviewed. Labs reviewed. Spoke with her sister on the telephone. This 51 year old woman who I saw several days ago I was asked to reconsult on today. When I saw her a few days ago she seemed in fairly good shape. Apparently in the last several days she has had a profound change in her mental state. When I came to see her this evening she appeared to be awake with  her eyes open but did not communicate with me at all. She did say the word yes at one point when I ask her if she could speak but then would not respond verbally to anything else. Furthermore she wouldn't respond to any of my requests that she move her hand  or close her eyes or do anything else. She continued to keep her eyes open but did not really make much eye contact and instead just stared around the room. Affect appeared to be completely flat. She frequently would put her hands up on her head as though she were in some kind of distress. Apparently she has been not communicating with most of the staff over the last couple days. I spoke with her sister today however who said that the patient "talked all day". He said that the patient can communicate at times when she wants to but that she seems mentally to be very different than usual. So far none of the labs are work up shows anything that would account for this kind of profound change. MRI unremarkable. All the labs pretty unremarkable.  Social history: Patient at baseline lives alone. Sounds like is a fair bit of weird drama in the family according to what the sister described with a lot of drama and conflict between several members of the family.  Medical history: Multiple medical problems.  Substance abuse history: Nonidentified  Past Psychiatric History: There is nothing to document that the patient has had any severe depression in the past. No psychiatric hospitalization no history of psychosis no history of bipolar disorder. She was not on any psychiatric medicine except a chronic dose of Klonopin. Sister says she's never seen her relative be like this before.  Risk to Self: Is patient at risk for suicide?: No Risk to Others:   Prior Inpatient Therapy:   Prior Outpatient Therapy:    Past Medical History:  Past Medical History:  Diagnosis Date  . Anxiety   . Breast cancer ([place])    16 yrs ago  . CHF (congestive heart failure) ([place])   . Diabetes mellitus without complication ([place])   . DJD (degenerative joint disease)   . Dysuria   . H/O total knee replacement    right  . HTN (hypertension)   . Hypercholesteremia   . Kidney stone   . Obesity   . Recurrent urinary tract infection    . SVT (supraventricular tachycardia) ([place])   . Urinary incontinence   . Vaginal atrophy   . Yeast vaginitis     Past Surgical History:  Procedure Laterality Date  . CARDIAC CATHETERIZATION    . CHOLECYSTECTOMY    . FOOT SURGERY    . JOINT REPLACEMENT    . MASTECTOMY Right 1997  . NASAL SEPTUM SURGERY    . PERIPHERAL VASCULAR CATHETERIZATION N/A 07/08/2015   Procedure: PICC Line Insertion;  Surgeon: Algernon Huxley, MD;  Location: [facility];  Service: Cardiovascular;  Laterality: N/A;  . TONSILLECTOMY    . Vocal cords     Family History:  Family History  Problem Relation Age of Onset  . Lung cancer Father   . Hematuria Mother   . Lung cancer Mother   . Kidney disease Neg Hx   . Bladder Cancer Neg Hx   . Breast cancer Neg Hx    Family Psychiatric  History: Nothing identified Social History:  History  Alcohol Use No     History  Drug Use No    Social History  Social History  . Marital status: Widowed    Spouse name: N/A  . Number of children: N/A  . Years of education: N/A   Social History Main Topics  . Smoking status: Former Research scientist (life sciences)  . Smokeless tobacco: Former Systems developer    Quit date: 10/29/1995  . Alcohol use No  . Drug use: No  . Sexual activity: Not Asked   Other Topics Concern  . None   Social History Narrative  . None   Additional Social History:    Allergies:   Allergies  Allergen Reactions  . Sulfa Antibiotics Hives  . Biaxin [Clarithromycin] Hives  . Eggs Or Egg-Derived Products Other (See Comments)    Pt states that her allergy test said she was allergic to eggs.    . Influenza A (H1n1) Monoval Vac Other (See Comments)    Pt states that she was told by her MD not to get the influenza vaccine.    . Morphine Other (See Comments)    Reaction:  Dizziness and confusion   . Pyridium [Phenazopyridine Hcl] Other (See Comments)    Reaction:  Unknown   . Ceftriaxone Anxiety  . Latex Rash  . Prednisone Rash  . Tape Rash    Labs:  Results  for orders placed or performed during the hospital encounter of 09/24/16 (from the past 48 hour(s))  Glucose, capillary     Status: Abnormal   Collection Time: 10/06/16  9:56 PM  Result Value Ref Range   Glucose-Capillary 207 (H) 65 - 99 mg/dL   Comment 1 Notify RN   Glucose, capillary     Status: Abnormal   Collection Time: 10/06/16 11:02 PM  Result Value Ref Range   Glucose-Capillary 207 (H) 65 - 99 mg/dL   Comment 1 Notify RN   Glucose, capillary     Status: None   Collection Time: 10/07/16  7:36 AM  Result Value Ref Range   Glucose-Capillary 99 65 - 99 mg/dL  Glucose, capillary     Status: Abnormal   Collection Time: 10/07/16 11:45 AM  Result Value Ref Range   Glucose-Capillary 193 (H) 65 - 99 mg/dL  Glucose, capillary     Status: Abnormal   Collection Time: 10/07/16  3:55 PM  Result Value Ref Range   Glucose-Capillary 165 (H) 65 - 99 mg/dL  Glucose, capillary     Status: Abnormal   Collection Time: 10/07/16  8:59 PM  Result Value Ref Range   Glucose-Capillary 170 (H) 65 - 99 mg/dL  Glucose, capillary     Status: Abnormal   Collection Time: 10/07/16 11:05 PM  Result Value Ref Range   Glucose-Capillary 166 (H) 65 - 99 mg/dL  CBC     Status: Abnormal   Collection Time: 10/08/16  4:56 AM  Result Value Ref Range   WBC 6.8 3.6 - 11.0 K/uL   RBC 4.71 3.80 - 5.20 MIL/uL   Hemoglobin 13.0 12.0 - 16.0 g/dL   HCT 38.2 35.0 - 47.0 %   MCV 81.2 80.0 - 100.0 fL   MCH 27.6 26.0 - 34.0 pg   MCHC 34.0 32.0 - 36.0 g/dL   RDW 16.8 (H) 11.5 - 14.5 %   Platelets 239 150 - 440 K/uL  Creatinine, serum     Status: Abnormal   Collection Time: 10/08/16  4:56 AM  Result Value Ref Range   Creatinine, Ser 1.04 (H) 0.44 - 1.00 mg/dL   GFR calc non Af Amer 52 (L) >60 mL/min   GFR calc Af  Amer >60 >60 mL/min    Comment: (NOTE) The eGFR has been calculated using the CKD EPI equation. This calculation has not been validated in all clinical situations. eGFR's persistently <60 mL/min signify  possible Chronic Kidney Disease.   Glucose, capillary     Status: Abnormal   Collection Time: 10/08/16  7:27 AM  Result Value Ref Range   Glucose-Capillary 141 (H) 65 - 99 mg/dL   Comment 1 Notify RN   Glucose, capillary     Status: Abnormal   Collection Time: 10/08/16 11:31 AM  Result Value Ref Range   Glucose-Capillary 205 (H) 65 - 99 mg/dL   Comment 1 Notify RN   Glucose, capillary     Status: Abnormal   Collection Time: 10/08/16  5:14 PM  Result Value Ref Range   Glucose-Capillary 198 (H) 65 - 99 mg/dL    Current Facility-Administered Medications  Medication Dose Route Frequency Provider Last Rate Last Dose  . acetaminophen (TYLENOL) tablet 650 mg  650 mg Oral Q6H PRN Gladstone Lighter, MD   650 mg at 09/27/16 0920   Or  . acetaminophen (TYLENOL) suppository 650 mg  650 mg Rectal Q6H PRN Gladstone Lighter, MD      . alum & mag hydroxide-simeth (MAALOX/MYLANTA) 200-200-20 MG/5ML suspension 30 mL  30 mL Oral Q4H PRN Theodoro Grist, MD   30 mL at 09/25/16 2359  . amLODipine (NORVASC) tablet 10 mg  10 mg Oral Daily Theodoro Grist, MD   10 mg at 10/08/16 1114  . ARIPiprazole (ABILIFY) tablet 5 mg  5 mg Oral Daily Gonzella Lex, MD   5 mg at 10/08/16 1111  . atorvastatin (LIPITOR) tablet 20 mg  20 mg Oral QHS Gladstone Lighter, MD   20 mg at 10/07/16 2315  . citalopram (CELEXA) tablet 20 mg  20 mg Oral Daily Gonzella Lex, MD   20 mg at 10/08/16 1112  . clonazePAM (KLONOPIN) tablet 0.25 mg  0.25 mg Oral BID Gonzella Lex, MD      . conjugated estrogens (PREMARIN) vaginal cream 1 Applicatorful  1 Applicatorful Vaginal Once per day on Mon Wed Fri Gladstone Lighter, MD   1 Applicatorful at 91/79/15 1115  . darifenacin (ENABLEX) 24 hr tablet 15 mg  15 mg Oral Daily Theodoro Grist, MD   15 mg at 10/08/16 1110  . desmopressin (DDAVP) tablet 0.2 mg  0.2 mg Oral QHS Gladstone Lighter, MD   0.2 mg at 10/07/16 2313  . docusate sodium (COLACE) capsule 100 mg  100 mg Oral BID Napoleon Form, RPH    100 mg at 10/08/16 1113  . enoxaparin (LOVENOX) injection 40 mg  40 mg Subcutaneous Q12H Gladstone Lighter, MD   40 mg at 10/08/16 1109  . feeding supplement (ENSURE ENLIVE) (ENSURE ENLIVE) liquid 237 mL  237 mL Oral TID BM Henreitta Leber, MD   237 mL at 10/08/16 1442  . insulin aspart (novoLOG) injection 0-5 Units  0-5 Units Subcutaneous QHS Gladstone Lighter, MD   2 Units at 10/06/16 2320  . insulin aspart (novoLOG) injection 0-9 Units  0-9 Units Subcutaneous TID WC Gladstone Lighter, MD   2 Units at 10/08/16 1734  . insulin glargine (LANTUS) injection 40 Units  40 Units Subcutaneous QHS Henreitta Leber, MD   40 Units at 10/07/16 2316  . iron polysaccharides (NIFEREX) capsule 150 mg  150 mg Oral TID Gladstone Lighter, MD   150 mg at 10/08/16 1734  . loratadine (CLARITIN) tablet 10 mg  10  mg Oral Daily Gladstone Lighter, MD   10 mg at 10/08/16 1112  . metoCLOPramide (REGLAN) tablet 5 mg  5 mg Oral TID AC Theodoro Grist, MD   5 mg at 10/08/16 1734  . metoprolol succinate (TOPROL-XL) 24 hr tablet 50 mg  50 mg Oral BID Theodoro Grist, MD   50 mg at 10/08/16 1114  . nystatin (MYCOSTATIN/NYSTOP) topical powder   Topical TID Gladstone Lighter, MD      . ondansetron [facility]) tablet 4 mg  4 mg Oral Q6H PRN Gladstone Lighter, MD   4 mg at 09/29/16 1109   Or  . ondansetron (ZOFRAN) injection 4 mg  4 mg Intravenous Q6H PRN Gladstone Lighter, MD   4 mg at 09/27/16 0950  . pantoprazole (PROTONIX) EC tablet 40 mg  40 mg Oral BID Gladstone Lighter, MD   40 mg at 10/08/16 1113  . polyvinyl alcohol (LIQUIFILM TEARS) 1.4 % ophthalmic solution 1 drop  1 drop Both Eyes PRN Theodoro Grist, MD      . potassium chloride SA (K-DUR,KLOR-CON) CR tablet 20 mEq  20 mEq Oral Daily Gladstone Lighter, MD   20 mEq at 10/08/16 1110  . senna (SENOKOT) tablet 8.6 mg  1 tablet Oral Daily Napoleon Form, RPH   8.6 mg at 10/08/16 1110  . vitamin E capsule 1,000 Units  1,000 Units Oral Daily Gladstone Lighter, MD   1,000 Units at  10/08/16 1109    Musculoskeletal: Strength & Muscle Tone: within normal limits Gait & Station: unable to stand Patient leans: N/A  Psychiatric Specialty Exam: Physical Exam  Nursing note and vitals reviewed. Constitutional: She appears well-developed and well-nourished.  HENT:  Head: Normocephalic and atraumatic.  Eyes: Conjunctivae are normal. Pupils are equal, round, and reactive to light.  Neck: Normal range of motion.  Cardiovascular: Normal heart sounds.   Respiratory: Effort normal.  GI: Soft.  Musculoskeletal: Normal range of motion.  Neurological: She is alert.  Skin: Skin is warm and dry.  Psychiatric: Her affect is blunt and inappropriate. Her speech is delayed. She is withdrawn. She exhibits abnormal recent memory.    Review of Systems  Unable to perform ROS: Psychiatric disorder    Blood pressure (!) 146/56, pulse 73, temperature 98.8 F (37.1 C), temperature source Oral, resp. rate 16, height _0  (1.727 m), weight 131.5 kg (290 lb), SpO2 91 %.Body mass index is 44.09 kg/m.  General Appearance: Fairly Groomed  Eye Contact:  Minimal  Speech:  Negative  Volume:  Decreased  Mood:  Negative  Affect:  Negative  Thought Process:  NA  Orientation:  Negative  Thought Content:  Negative  Suicidal Thoughts:  Unknown  Homicidal Thoughts:  Unknown  Memory:  Negative  Judgement:  Negative  Insight:  Negative  Psychomotor Activity:  Negative  Concentration:  Concentration: Negative  Recall:  Negative  Fund of Knowledge:  Negative  Language:  Negative  Akathisia:  Negative  Handed:  Right  AIMS (if indicated):     Assets:  Social Support  ADL's:  Impaired  Cognition:  Impaired,  Severe  Sleep:        Treatment Plan Summary: Daily contact with patient to assess and evaluate symptoms and progress in treatment, Medication management and Plan No change to current doses of antidepressant and antipsychotic. I did cut down on her clonazepam again to a quarter  milligram. I will sign this case out to the psychiatrist on call over the weekend. I certainly continue to understand the frustration  of the medical staff. Spoke with the hospitalist today. Nevertheless I think the patient does have a mental illness which is potentially treatable and showing some signs of improvement and I will continue to follow-up with her.  Disposition: Recommend psychiatric Inpatient admission when medically cleared. Supportive therapy provided about ongoing stressors.  Alethia Berthold, MD 10/08/2016 7:12 PM

## 2016-10-08 NOTE — Care Management Important Message (Signed)
Important Message  Patient Details  Name: GROVER WOODFIELD MRN: [id number] Date of Birth: [birth year]   Medicare Important Message Given:  Yes    Beverly Sessions, RN 10/08/2016, 3:32 PM

## 2016-10-08 NOTE — Progress Notes (Signed)
Carolyn Valdez NAME: Carolyn Valdez    MR#:  [id number]  DATE OF BIRTH:  Aug 18, 1941  SUBJECTIVE:   [facility] not accept pt.  Still under IVC as per Pscyh.  ?? Plans for starting ECT as per Psych.    REVIEW OF SYSTEMS:    Review of Systems  Unable to perform ROS: Mental acuity    Nutrition: Heart Healthy/Carb control Tolerating Diet: Yes Tolerating PT: Bedbound.   DRUG ALLERGIES:   Allergies  Allergen Reactions  . Sulfa Antibiotics Hives  . Biaxin [Clarithromycin] Hives  . Eggs Or Egg-Derived Products Other (See Comments)    Pt states that her allergy test said she was allergic to eggs.    . Influenza A (H1n1) Monoval Vac Other (See Comments)    Pt states that she was told by her MD not to get the influenza vaccine.    . Morphine Other (See Comments)    Reaction:  Dizziness and confusion   . Pyridium [Phenazopyridine Hcl] Other (See Comments)    Reaction:  Unknown   . Ceftriaxone Anxiety  . Latex Rash  . Prednisone Rash  . Tape Rash    VITALS:  Blood pressure (!) 146/56, pulse 73, temperature 98.8 F (37.1 C), temperature source Oral, resp. rate 16, height 5\' 8"  (1.727 m), weight 131.5 kg (290 lb), SpO2 91 %.  PHYSICAL EXAMINATION:   Physical Exam  GENERAL:  75 y.o.-year-old obese patient lying in the bed in NAD.  EYES: Pupils equal, round, reactive to light.. No scleral icterus. Extraocular muscles intact.  HEENT: Head atraumatic, normocephalic. Oropharynx and nasopharynx clear. Moist oral Mucosa. NECK:  Supple, no jugular venous distention. No thyroid enlargement, no tenderness.  LUNGS: Normal breath sounds bilaterally, no wheezing, rales, rhonchi. No use of accessory muscles of respiration.  CARDIOVASCULAR: S1, S2 normal. No murmurs, rubs, or gallops.  ABDOMEN: Soft, nontender, nondistended. Bowel sounds present. No organomegaly or mass.  EXTREMITIES: No cyanosis, clubbing, +1-2 edema b/l due to chronic  venous stasis  NEUROLOGIC: Cranial nerves II through XII are intact. No focal Motor or sensory deficits b/l. Globally weak.   PSYCHIATRIC: The patient is alert and oriented x 1.  SKIN: No obvious rash, lesion, or ulcer.    LABORATORY PANEL:   CBC  Recent Labs Lab 10/08/16 0456  WBC 6.8  HGB 13.0  HCT 38.2  PLT 239   ------------------------------------------------------------------------------------------------------------------  Chemistries   Recent Labs Lab 10/02/16 0516 10/08/16 0456  NA 135  --   K 3.6  --   CL 96*  --   CO2 30  --   GLUCOSE 92  --   BUN 12  --   CREATININE 0.86 1.04*  CALCIUM 10.0  --   AST 34  --   ALT 16  --   ALKPHOS 59  --   BILITOT 0.9  --    ------------------------------------------------------------------------------------------------------------------  Cardiac Enzymes No results for input(s): TROPONINI in the last 168 hours. ------------------------------------------------------------------------------------------------------------------  RADIOLOGY:  No results found.   ASSESSMENT AND PLAN:   75 year old female with past medical history of diabetes, recurrent urinary tract infections, previous history of ESBL UTI, attention, hyperlipidemia, obesity, history of SVT, CHF, breast cancer who presented to the hospital due to altered mental status/delirium.  1. Altered mental status/delirium- Psychogenic in nature and likely related to severe depression. - Patient has had previous history of urinary tract infections but does not have a UTI presently. -No evidence of  Any other acute metabolic source. Seen by neurology and no acute Neurologic source.  EEG negative for any acute seizures.  -Continue Klonopin. [facility] facilities did not accept the patient and she is total care. -Continue Abilify, Celexa. As per psychiatry they're considering starting the patient on ECT.   2. DM Type II w/out complication - cont. Lantus, Novolog SS -  BS stable.    3. Anxiety - cont. Klonopin  4. Hyperlipidemia - cont. Atorvastatin  5. HTN - cont. Norvasc, Toprol  6. GERD - cont. Protonix.   7. Hx of Diabetic Gastroparesis - cont. Reglan  Appreciate Palliative care input to discuss goals of care with sister and pt. Is DNR.   All the records are reviewed and case discussed with Care Management/Social Worker. Management plans discussed with the patient, family and they are in agreement.  CODE STATUS: DNR  DVT Prophylaxis: Lovenox  TOTAL TIME TAKING CARE OF THIS PATIENT: 25 minutes.   POSSIBLE D/C unclear, DEPENDING on progress.   Henreitta Leber M.D on 10/08/2016 at 2:43 PM  Between 7am to 6pm - Pager - [phone]  After 6pm go to [email or web address] - [employer] [facility]  Office  [phone]  CC: Primary care physician; Carolyn Ramsay, MD

## 2016-10-09 LAB — GLUCOSE, CAPILLARY
GLUCOSE-CAPILLARY: 160 mg/dL — AB (ref 65–99)
Glucose-Capillary: 233 mg/dL — ABNORMAL HIGH (ref 65–99)
Glucose-Capillary: 254 mg/dL — ABNORMAL HIGH (ref 65–99)

## 2016-10-09 MED ORDER — FUROSEMIDE 20 MG PO TABS
20.0000 mg | ORAL_TABLET | Freq: Every day | ORAL | Status: DC
  Administered 2016-10-09 – 2016-10-10 (×2): 20 mg via ORAL
  Filled 2016-10-09 (×2): qty 1

## 2016-10-09 MED ORDER — TRAZODONE HCL 50 MG PO TABS
50.0000 mg | ORAL_TABLET | Freq: Every day | ORAL | Status: DC
  Administered 2016-10-09 – 2016-10-25 (×15): 50 mg via ORAL
  Filled 2016-10-09 (×16): qty 1

## 2016-10-09 NOTE — Progress Notes (Signed)
Calorie Count Note  48 hour calorie count ordered.  Diet: Regular Supplements: Ensure Enlive TID, Magic Cup TID  Breakfast: 1 OJ, 1/2 Carton Milk, 1/4 Sausage Patty, 4 Pieces of fruit - 176 calories, 6gm protein Lunch: Pt refused to eat Dinner: 1 tsp magic cup, pt refused to eat Supplements: 1.5 Ensures - 525 calories, 30gm protein Snacks: 1/2 granola bar, 1C soup (type of soup not indicated)  Total intake: 701 kcal (44% of minimum estimated needs)  30gm protein (30% of minimum estimated needs)  Nutrition Dx:Inadequate oral intake related to acute illness (AMS) as evidenced by poor appetite, meal completion <50%  Goal: Meet >/= 80% nutrition needs  Intervention:  1. Continue feeding assistance, encouragement, and redirection at all meals 2. Continue Ensure Enlive po TID, each supplement provides 350 kcal and 20 grams of protein, Magic cup TID with meals, each supplement provides 290 kcal and 9 grams of protein  Satira Anis. Gannon Heinzman, MS, RD LDN Inpatient Clinical Dietitian Pager [phone]

## 2016-10-09 NOTE — Progress Notes (Signed)
Patient ID: Carolyn Valdez, female   DOB: March 26, 1941, 75 y.o.   MRN: [id number]  [facility] PROGRESS NOTE  ZOYE CHANDRA UYQ:[id number] DOB: [birth year] DOA: 09/24/2016 PCP: Leonel Ramsay, MD  HPI/Subjective: Patient is not the best historian. Answers some yes or no questions but loses train of thought when she is trying to speak. As per the nurse she did not sleep last night.  Objective: Vitals:   10/09/16 0448 10/09/16 0922  BP: (!) 155/63 (!) 149/69  Pulse: 71 80  Resp: 17 16  Temp: 98.6 F (37 C) 98.2 F (36.8 C)    Filed Weights   09/24/16 1159  Weight: 131.5 kg (290 lb)    ROS: Review of Systems  Constitutional: Negative for chills and fever.  Eyes: Negative for blurred vision.  Respiratory: Negative for cough and shortness of breath.   Cardiovascular: Negative for chest pain.  Gastrointestinal: Negative for abdominal pain, constipation, diarrhea, nausea and vomiting.  Genitourinary: Negative for dysuria.  Musculoskeletal: Negative for joint pain.  Neurological: Negative for dizziness and headaches.   Exam: Physical Exam  HENT:  Nose: No mucosal edema.  Mouth/Throat: No oropharyngeal exudate or posterior oropharyngeal edema.  Eyes: Conjunctivae, EOM and lids are normal. Pupils are equal, round, and reactive to light.  Neck: No JVD present. Carotid bruit is not present. No edema present. No thyroid mass and no thyromegaly present.  Cardiovascular: S1 normal and S2 normal.  Exam reveals no gallop.   Murmur heard.  Systolic murmur is present with a grade of 2/6  Pulses:      Dorsalis pedis pulses are 2+ on the right side, and 2+ on the left side.  Respiratory: No respiratory distress. She has decreased breath sounds in the right lower field and the left lower field. She has no wheezes. She has no rhonchi. She has no rales.  GI: Soft. Bowel sounds are normal. There is no tenderness.  Musculoskeletal:       Right ankle: She exhibits swelling.       Left ankle:  She exhibits swelling.  Lymphadenopathy:    She has no cervical adenopathy.  Neurological: She is alert. No cranial nerve deficit.  Skin: Skin is warm. No rash noted. Nails show no clubbing.  Psychiatric: Her affect is blunt. She is slowed.      Data Reviewed: Basic Metabolic Panel:  Recent Labs Lab 10/08/16 0456  CREATININE 1.04*   CBC:  Recent Labs Lab 10/08/16 0456  WBC 6.8  HGB 13.0  HCT 38.2  MCV 81.2  PLT 239   BNP (last 3 results)  Recent Labs  02/23/16 1617 08/09/16 1550 08/31/16 2145  BNP 181.0* 311.0* 163.0*     CBG:  Recent Labs Lab 10/08/16 1131 10/08/16 1714 10/08/16 2141 10/09/16 0727 10/09/16 1155  GLUCAP 205* 198* 214* 160* 233*     Scheduled Meds: . amLODipine  10 mg Oral Daily  . ARIPiprazole  5 mg Oral Daily  . atorvastatin  20 mg Oral QHS  . citalopram  20 mg Oral Daily  . clonazePAM  0.25 mg Oral BID  . conjugated estrogens  1 Applicatorful Vaginal Once per day on Mon Wed Fri  . darifenacin  15 mg Oral Daily  . desmopressin  0.2 mg Oral QHS  . docusate sodium  100 mg Oral BID  . enoxaparin (LOVENOX) injection  40 mg Subcutaneous Q12H  . feeding supplement (ENSURE ENLIVE)  237 mL Oral TID BM  . insulin aspart  0-5 Units  Subcutaneous QHS  . insulin aspart  0-9 Units Subcutaneous TID WC  . insulin glargine  40 Units Subcutaneous QHS  . iron polysaccharides  150 mg Oral TID  . loratadine  10 mg Oral Daily  . metoCLOPramide  5 mg Oral TID AC  . metoprolol succinate  50 mg Oral BID  . nystatin   Topical TID  . pantoprazole  40 mg Oral BID  . potassium chloride SA  20 mEq Oral Daily  . senna  1 tablet Oral Daily  . traZODone  50 mg Oral QHS  . vitamin E  1,000 Units Oral Daily   Continuous Infusions:  Assessment/Plan:  1. 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 Psychiatry would like to have on inpatient unit but the patient does not walk. And trazodone because the patient did not sleep last night. Currently  on Abilify, Celexa and Klonopin. 2. Type 2 diabetes without complication on Lantus and sliding scale 3. Hyperlipidemia unspecified on atorvastatin. I will stop this medication to see if this has anything to do with her mental status 4. Essential hypertension on Norvasc and Toprol 5. GERD on Protonix 6. History of diabetic gastroparesis on Reglan 7. History of diastolic congestive heart failure and lower extremity edema. Start oral Lasix. 8. Weakness. Physical therapy states that she is bedbound  Code Status:     Code Status Orders        Start     Ordered   10/05/16 1217  Do not attempt resuscitation (DNR)  Continuous    Question Answer Comment  In the event of cardiac or respiratory ARREST Do not call a "code blue"   In the event of cardiac or respiratory ARREST Do not perform Intubation, CPR, defibrillation or ACLS   In the event of cardiac or respiratory ARREST Use medication by any route, position, wound care, and other measures to relive pain and suffering. May use oxygen, suction and manual treatment of airway obstruction as needed for comfort.      10/05/16 1216    Code Status History    Date Active Date Inactive Code Status Order ID Comments User Context   09/24/2016  5:31 PM 10/05/2016 12:16 PM Full Code [id number]  Gladstone Lighter, MD Inpatient   09/01/2016  2:17 AM 09/05/2016  4:28 PM Full Code [id number]  Harvie Bridge, DO Inpatient   08/09/2016  8:27 PM 08/12/2016  8:15 PM Full Code [id number]  Henreitta Leber, MD Inpatient   09/30/2015  2:23 PM 10/07/2015  5:37 PM Full Code [id number]  Loletha Grayer, MD ED   09/05/2015  5:56 PM 09/09/2015  5:38 PM DNR [id number]  Loletha Grayer, MD ED    Advance Directive Documentation   [place] Most Recent Value  Type of Advance Directive  Living will  Pre-existing out of facility DNR order (yellow form or pink MOST form)  No data  "MOST" Form in Place?  No data     Disposition Plan: No  plan  Consultants:  Psychiatry  Time spent: 28 minutes  Loletha Grayer  [employer]

## 2016-10-09 NOTE — Progress Notes (Signed)
PT Cancellation Note  Patient Details Name: Carolyn Valdez MRN: [id number] DOB: 04-13-1941   Cancelled Treatment:    Reason Eval/Treat Not Completed: PT screened, no needs identified, will sign off (Pt was already seen by PT and is hoyer transfer at baseline).  No further needs identified from rescreening.   Ramond Dial 10/09/2016, 11:11 AM   Mee Hives, PT MS Acute Rehab Dept. Number: [place]

## 2016-10-10 LAB — GLUCOSE, CAPILLARY
GLUCOSE-CAPILLARY: 147 mg/dL — AB (ref 65–99)
GLUCOSE-CAPILLARY: 188 mg/dL — AB (ref 65–99)
GLUCOSE-CAPILLARY: 297 mg/dL — AB (ref 65–99)
Glucose-Capillary: 179 mg/dL — ABNORMAL HIGH (ref 65–99)
Glucose-Capillary: 157 mg/dL — ABNORMAL HIGH (ref 65–99)

## 2016-10-10 NOTE — Progress Notes (Signed)
Nutrition Follow-Up Brief Note  48 hr calorie count ordered 12/21. Today (12/24) is day 3 of calorie count. Pt not meeting estimated needs.   Results:  Day 1: Total intake: (if pt drinks 2 Ensure) 1150 kcal (70% of minimum estimated needs) 1600-2000 kcal goal 63 protein (79% of minimum estimated needs) 80-110 grams of protein  Day 2: Total intake: 701 kcal (44% of minimum estimated needs)  30gm protein (30% of minimum estimated needs)  Day 3: Total intake: 548 kcal (34% of minimum estimated needs)  29.5gmprotein (30% of minimum estimated needs)  Spoke with Dr. Leslye Peer today. Dietitian recommending Nutrition Support via enteral feedings as pt is not meeting at least 80% estimated needs. MD does not wish to pursue tube placement and enteral feedings at this time. Requests to continue calorie count.   Plan is to encourage PO intake and supplements; pt will usually drink at least one Ensure per day. RD will reassess 12/26 to see if intake improved.   Koleen Distance, RD, LDN Pager #- [phone]

## 2016-10-10 NOTE — Clinical Social Work Note (Addendum)
Physical therapy was reordered for patient but due to PT stating that they have already assessed patient and patient is bedbound at baseline, they are not reinitiating the consult. Patient remains total care and remains inappropriate for rehab placement as well due to not being rehabable. It is advised that patient return to her previous living situation which was 24/7 total care at home with services in the home.   Shela Leff MSW,LCSW [phone]

## 2016-10-10 NOTE — Plan of Care (Signed)
Problem: Education: Goal: Knowledge of  General Education information/materials will improve Outcome: Not Progressing Pt confused  Problem: Health Behavior/Discharge Planning: Goal: Ability to manage health-related needs will improve Outcome: Not Progressing Pt requires assistance   

## 2016-10-10 NOTE — Progress Notes (Signed)
Patient ID: Carolyn Valdez, female   DOB: Jun 19, 1941, 75 y.o.   MRN: [id number]   [facility] PROGRESS NOTE  SHAKEIA KRUS WIO:[id number] DOB: [birth year] DOA: 09/24/2016 PCP: Leonel Ramsay, MD  HPI/Subjective: Patient is not the best historian. Answers some yes or no questions but loses train of thought when she is trying to speak. As per the nurse she did not sleep last night.  Objective: Vitals:   10/10/16 0756 10/10/16 1402  BP: (!) 165/63 (!) 156/57  Pulse: 71 67  Resp: 20   Temp: 98.9 F (37.2 C) 98.9 F (37.2 C)    Filed Weights   09/24/16 1159  Weight: 131.5 kg (290 lb)    ROS: Review of Systems  Unable to perform ROS: Acuity of condition  Respiratory: Negative for shortness of breath.   Cardiovascular: Negative for chest pain.  Gastrointestinal: Negative for abdominal pain, nausea and vomiting.   Exam: Physical Exam  HENT:  Nose: No mucosal edema.  Mouth/Throat: No oropharyngeal exudate or posterior oropharyngeal edema.  Eyes: Conjunctivae, EOM and lids are normal. Pupils are equal, round, and reactive to light.  Neck: No JVD present. Carotid bruit is not present. No edema present. No thyroid mass and no thyromegaly present.  Cardiovascular: S1 normal and S2 normal.  Exam reveals no gallop.   Murmur heard.  Systolic murmur is present with a grade of 2/6  Pulses:      Dorsalis pedis pulses are 2+ on the right side, and 2+ on the left side.  Respiratory: No respiratory distress. She has decreased breath sounds in the right lower field and the left lower field. She has no wheezes. She has no rhonchi. She has no rales.  GI: Soft. Bowel sounds are normal. There is no tenderness.  Musculoskeletal:       Right ankle: She exhibits swelling.       Left ankle: She exhibits swelling.  Lymphadenopathy:    She has no cervical adenopathy.  Neurological: She is alert. No cranial nerve deficit.  Skin: Skin is warm. No rash noted. Nails show no clubbing.  Psychiatric:  Her affect is blunt. She is slowed.      Data Reviewed: Basic Metabolic Panel:  Recent Labs Lab 10/08/16 0456  CREATININE 1.04*   CBC:  Recent Labs Lab 10/08/16 0456  WBC 6.8  HGB 13.0  HCT 38.2  MCV 81.2  PLT 239   BNP (last 3 results)  Recent Labs  02/23/16 1617 08/09/16 1550 08/31/16 2145  BNP 181.0* 311.0* 163.0*     CBG:  Recent Labs Lab 10/09/16 1155 10/09/16 1634 10/10/16 0734 10/10/16 1139 10/10/16 1246  GLUCAP 233* 254* 147* 188* 179*     Scheduled Meds: . amLODipine  10 mg Oral Daily  . ARIPiprazole  5 mg Oral Daily  . citalopram  20 mg Oral Daily  . clonazePAM  0.25 mg Oral BID  . conjugated estrogens  1 Applicatorful Vaginal Once per day on Mon Wed Fri  . darifenacin  15 mg Oral Daily  . desmopressin  0.2 mg Oral QHS  . docusate sodium  100 mg Oral BID  . enoxaparin (LOVENOX) injection  40 mg Subcutaneous Q12H  . feeding supplement (ENSURE ENLIVE)  237 mL Oral TID BM  . furosemide  20 mg Oral Daily  . insulin aspart  0-5 Units Subcutaneous QHS  . insulin aspart  0-9 Units Subcutaneous TID WC  . insulin glargine  40 Units Subcutaneous QHS  . iron polysaccharides  150 mg Oral TID  . loratadine  10 mg Oral Daily  . metoCLOPramide  5 mg Oral TID AC  . metoprolol succinate  50 mg Oral BID  . nystatin   Topical TID  . pantoprazole  40 mg Oral BID  . potassium chloride SA  20 mEq Oral Daily  . senna  1 tablet Oral Daily  . traZODone  50 mg Oral QHS  . vitamin E  1,000 Units Oral Daily    Assessment/Plan:  1. 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 Psychiatry would like to have on inpatient unit but the patient does not walk. Add trazodone because the patient did not sleep last night. Currently on Abilify, Celexa and Klonopin. 2. Type 2 diabetes-  on Lantus and sliding scale 3. Hyperlipidemia unspecified on atorvastatin. I will stop this medication to see if this has anything to do with her mental status 4. Essential  hypertension on Norvasc and Toprol 5. GERD on Protonix 6. History of diabetic gastroparesis on Reglan 7. History of diastolic congestive heart failure and lower extremity edema. Started oral Lasix. 8. Weakness. Physical therapy states that she is bedbound\ 9. Patient not keeping up with nutritional needs. Overall prognosis poor. Would rather not place feeding tube.  Code Status:     Code Status Orders        Start     Ordered   10/05/16 1217  Do not attempt resuscitation (DNR)  Continuous    Question Answer Comment  In the event of cardiac or respiratory ARREST Do not call a "code blue"   In the event of cardiac or respiratory ARREST Do not perform Intubation, CPR, defibrillation or ACLS   In the event of cardiac or respiratory ARREST Use medication by any route, position, wound care, and other measures to relive pain and suffering. May use oxygen, suction and manual treatment of airway obstruction as needed for comfort.      10/05/16 1216    Code Status History    Date Active Date Inactive Code Status Order ID Comments User Context   09/24/2016  5:31 PM 10/05/2016 12:16 PM Full Code [id number]  Gladstone Lighter, MD Inpatient   09/01/2016  2:17 AM 09/05/2016  4:28 PM Full Code [id number]  Harvie Bridge, DO Inpatient   08/09/2016  8:27 PM 08/12/2016  8:15 PM Full Code [id number]  Henreitta Leber, MD Inpatient   09/30/2015  2:23 PM 10/07/2015  5:37 PM Full Code [id number]  Loletha Grayer, MD ED   09/05/2015  5:56 PM 09/09/2015  5:38 PM DNR [id number]  Loletha Grayer, MD ED    Advance Directive Documentation   [place] Most Recent Value  Type of Advance Directive  Living will  Pre-existing out of facility DNR order (yellow form or pink MOST form)  No data  "MOST" Form in Place?  No data     Disposition Plan: No plan  Consultants:  Psychiatry  Time spent: 25 minutes. Spoke with sister on the phone  Loletha Grayer  [employer]

## 2016-10-10 NOTE — Progress Notes (Signed)
Calorie Count Note Day 3  48 hour calorie count ordered 12/21  Diet: Regular Supplements: Ensure Enlive TID, Magic Cup TID  Breakfast: 1 OJ, 1 Carton Milk, 1/2 slice bacon, 1/4 biscuit- 198kcal, 9.5g protein  Lunch: Pt refused to eat   Supplements: 1 Ensures - 350 calories, 20gm protein Snacks: none  Total intake:  548 kcal (34% of minimum estimated needs)  29.5gm protein (30% of minimum estimated needs)  Nutrition Dx:Inadequate oral intake related to acute illness (AMS) as evidenced by poor appetite, meal completion <50%  Goal: Meet >/= 80% nutrition needs  Intervention:  1. Continue feeding assistance, encouragement, and redirection at all meals 2. Continue Ensure Enlive po TID, each supplement provides 350 kcal and 20 grams of protein, Magic cup TID with meals, each supplement provides 290 kcal and 9 grams of protein

## 2016-10-11 LAB — CREATININE, SERUM
Creatinine, Ser: 1.12 mg/dL — ABNORMAL HIGH (ref 0.44–1.00)
GFR, EST AFRICAN AMERICAN: 55 mL/min — AB (ref >60)
GFR, EST NON AFRICAN AMERICAN: 48 mL/min — AB (ref >60)

## 2016-10-11 LAB — GLUCOSE, CAPILLARY
GLUCOSE-CAPILLARY: 260 mg/dL — AB (ref 65–99)
GLUCOSE-CAPILLARY: 244 mg/dL — AB (ref 65–99)
Glucose-Capillary: 248 mg/dL — ABNORMAL HIGH (ref 65–99)
Glucose-Capillary: 258 mg/dL — ABNORMAL HIGH (ref 65–99)
Glucose-Capillary: 279 mg/dL — ABNORMAL HIGH (ref 65–99)

## 2016-10-11 NOTE — Progress Notes (Signed)
Patient ID: Carolyn Valdez, female   DOB: [birth year], 75 y.o.   MRN: [id number]   [facility] PROGRESS NOTE  MCKELL RIECKE ZHY:[id number] DOB: 1940/12/22 DOA: 09/24/2016 PCP: Leonel Ramsay, MD  HPI/Subjective: Patient did not remember that her sister came to visit yesterday. Her sister states that she was with her all evening. She brought her food and she ate a little bit better with her. The patient's sister brought her in food today and will see how she eats today. I told the patient that she must eat oral she is not going to survive.  Objective: Vitals:   10/11/16 0610 10/11/16 0746  BP: (!) 151/62 (!) 155/80  Pulse: 82 82  Resp: 20 20  Temp: 98.4 F (36.9 C)     Filed Weights   09/24/16 1159  Weight: 131.5 kg (290 lb)    ROS: Review of Systems  Unable to perform ROS: Acuity of condition  Respiratory: Negative for shortness of breath.   Cardiovascular: Negative for chest pain.  Gastrointestinal: Negative for abdominal pain, nausea and vomiting.   Exam: Physical Exam  HENT:  Nose: No mucosal edema.  Mouth/Throat: No oropharyngeal exudate or posterior oropharyngeal edema.  Eyes: Conjunctivae, EOM and lids are normal. Pupils are equal, round, and reactive to light.  Neck: No JVD present. Carotid bruit is not present. No edema present. No thyroid mass and no thyromegaly present.  Cardiovascular: S1 normal and S2 normal.  Exam reveals no gallop.   Murmur heard.  Systolic murmur is present with a grade of 2/6  Pulses:      Dorsalis pedis pulses are 2+ on the right side, and 2+ on the left side.  Respiratory: No respiratory distress. She has decreased breath sounds in the right lower field and the left lower field. She has no wheezes. She has no rhonchi. She has no rales.  GI: Soft. Bowel sounds are normal. There is no tenderness.  Musculoskeletal:       Right ankle: She exhibits swelling.       Left ankle: She exhibits swelling.  Lymphadenopathy:    She has no  cervical adenopathy.  Neurological: She is alert. No cranial nerve deficit.  Skin: Skin is warm. No rash noted. Nails show no clubbing.  Psychiatric: Her affect is blunt. She is slowed.      Data Reviewed: Basic Metabolic Panel:  Recent Labs Lab 10/08/16 0456 10/11/16 0435  CREATININE 1.04* 1.12*   CBC:  Recent Labs Lab 10/08/16 0456  WBC 6.8  HGB 13.0  HCT 38.2  MCV 81.2  PLT 239   BNP (last 3 results)  Recent Labs  02/23/16 1617 08/09/16 1550 08/31/16 2145  BNP 181.0* 311.0* 163.0*     CBG:  Recent Labs Lab 10/10/16 1624 10/10/16 2100 10/11/16 0011 10/11/16 0710 10/11/16 1142  GLUCAP 157* 297* 258* 260* 279*     Scheduled Meds: . amLODipine  10 mg Oral Daily  . ARIPiprazole  5 mg Oral Daily  . citalopram  20 mg Oral Daily  . clonazePAM  0.25 mg Oral BID  . conjugated estrogens  1 Applicatorful Vaginal Once per day on Mon Wed Fri  . darifenacin  15 mg Oral Daily  . desmopressin  0.2 mg Oral QHS  . docusate sodium  100 mg Oral BID  . enoxaparin (LOVENOX) injection  40 mg Subcutaneous Q12H  . feeding supplement (ENSURE ENLIVE)  237 mL Oral TID BM  . insulin aspart  0-5 Units Subcutaneous QHS  .  insulin aspart  0-9 Units Subcutaneous TID WC  . insulin glargine  40 Units Subcutaneous QHS  . iron polysaccharides  150 mg Oral TID  . loratadine  10 mg Oral Daily  . metoCLOPramide  5 mg Oral TID AC  . metoprolol succinate  50 mg Oral BID  . nystatin   Topical TID  . pantoprazole  40 mg Oral BID  . potassium chloride SA  20 mEq Oral Daily  . senna  1 tablet Oral Daily  . traZODone  50 mg Oral QHS  . vitamin E  1,000 Units Oral Daily    Assessment/Plan:  1. 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 Psychiatry would like to have on inpatient unit but the patient does not walk. Added trazodone for insomnia. Currently on Abilify, Celexa and Klonopin. 2. Type 2 diabetes-  on Lantus and sliding scale. Would rather have sugars on the higher  side than too low. 3. Hyperlipidemia unspecified on atorvastatin. I will stop this medication to see if this has anything to do with her mental status 4. Essential hypertension on Norvasc and Toprol 5. GERD on Protonix 6. History of diabetic gastroparesis on Reglan 7. History of diastolic congestive heart failure and lower extremity edema. Hold Lasix with creatinine starting to creep up. Patient must eat better. 8. Weakness. Physical therapy states that she is bedbound. The sister states that she did walk a few weeks ago at [facility] 9. Patient not keeping up with nutritional needs. Overall prognosis poor. Patient states that she does not want a tube in the nose to feed her. I advised the patient that she must eat better in order to avoid this.  Code Status:     Code Status Orders        Start     Ordered   10/05/16 1217  Do not attempt resuscitation (DNR)  Continuous    Question Answer Comment  In the event of cardiac or respiratory ARREST Do not call a "code blue"   In the event of cardiac or respiratory ARREST Do not perform Intubation, CPR, defibrillation or ACLS   In the event of cardiac or respiratory ARREST Use medication by any route, position, wound care, and other measures to relive pain and suffering. May use oxygen, suction and manual treatment of airway obstruction as needed for comfort.      10/05/16 1216    Code Status History    Date Active Date Inactive Code Status Order ID Comments User Context   09/24/2016  5:31 PM 10/05/2016 12:16 PM Full Code [id number]  Gladstone Lighter, MD Inpatient   09/01/2016  2:17 AM 09/05/2016  4:28 PM Full Code [id number]  Harvie Bridge, DO Inpatient   08/09/2016  8:27 PM 08/12/2016  8:15 PM Full Code [id number]  Henreitta Leber, MD Inpatient   09/30/2015  2:23 PM 10/07/2015  5:37 PM Full Code [id number]  Loletha Grayer, MD ED   09/05/2015  5:56 PM 09/09/2015  5:38 PM DNR [id number]  Loletha Grayer, MD ED    Advance Directive  Documentation   [place] Most Recent Value  Type of Advance Directive  Living will  Pre-existing out of facility DNR order (yellow form or pink MOST form)  No data  "MOST" Form in Place?  No data     Disposition Plan: No plan  Consultants:  Psychiatry  Time spent: 25 minutes. Spoke with sister on the phone Again today  Loletha Grayer  [employer]

## 2016-10-11 NOTE — Progress Notes (Signed)
10/11/2016 4:41 PM  Pt chart showed patient has an egg allergy.  Both patient and her sister and POA stated this was inaccurate and the patient can in fact safely eat eggs.  They asked me to remove this from allergy list.  Dola Argyle, RN

## 2016-10-12 LAB — GLUCOSE, CAPILLARY
GLUCOSE-CAPILLARY: 268 mg/dL — AB (ref 65–99)
GLUCOSE-CAPILLARY: 256 mg/dL — AB (ref 65–99)
GLUCOSE-CAPILLARY: 315 mg/dL — AB (ref 65–99)
GLUCOSE-CAPILLARY: 205 mg/dL — AB (ref 65–99)

## 2016-10-12 MED ORDER — ARIPIPRAZOLE 15 MG PO TABS
7.5000 mg | ORAL_TABLET | Freq: Every day | ORAL | Status: DC
  Administered 2016-10-13 – 2016-10-20 (×8): 7.5 mg via ORAL
  Filled 2016-10-12 (×8): qty 1

## 2016-10-12 MED ORDER — CITALOPRAM HYDROBROMIDE 20 MG PO TABS
30.0000 mg | ORAL_TABLET | Freq: Every day | ORAL | Status: DC
  Administered 2016-10-13 – 2016-10-20 (×8): 30 mg via ORAL
  Filled 2016-10-12 (×8): qty 1

## 2016-10-12 NOTE — Progress Notes (Signed)
Patient ID: Carolyn Valdez, female   DOB: 1941-10-18, 75 y.o.   MRN: [id number]   [facility] PROGRESS NOTE  Carolyn Valdez EHM:[id number] DOB: Oct 08, 1941 DOA: 09/24/2016 PCP: Leonel Ramsay, MD  HPI/Subjective: Patient was less talkative today. I asked her to recall what we talked about yesterday and she did not tell me. I told her that if she doesn't eat she is going to die. Patient did not recall her sister coming in feeding her yesterday. Sister states that the patient has been accusing her of taking her diamond ring and wanted to take her grandfather clock.  Objective: Vitals:   10/12/16 0510 10/12/16 1113  BP: (!) 156/73 (!) 161/63  Pulse: 70 67  Resp: 18   Temp: 98.4 F (36.9 C) 98.1 F (36.7 C)    Filed Weights   09/24/16 1159  Weight: 131.5 kg (290 lb)    ROS: Review of Systems  Unable to perform ROS: Acuity of condition   Exam: Physical Exam  HENT:  Nose: No mucosal edema.  Mouth/Throat: No oropharyngeal exudate or posterior oropharyngeal edema.  Eyes: Conjunctivae, EOM and lids are normal. Pupils are equal, round, and reactive to light.  Neck: No JVD present. Carotid bruit is not present. No edema present. No thyroid mass and no thyromegaly present.  Cardiovascular: S1 normal and S2 normal.  Exam reveals no gallop.   Murmur heard.  Systolic murmur is present with a grade of 2/6  Pulses:      Dorsalis pedis pulses are 2+ on the right side, and 2+ on the left side.  Respiratory: No respiratory distress. She has decreased breath sounds in the right lower field and the left lower field. She has no wheezes. She has no rhonchi. She has no rales.  GI: Soft. Bowel sounds are normal. There is no tenderness.  Musculoskeletal:       Right ankle: She exhibits swelling.       Left ankle: She exhibits swelling.  Lymphadenopathy:    She has no cervical adenopathy.  Neurological: She is alert. No cranial nerve deficit.  Skin: Skin is warm. No rash noted. Nails show no  clubbing.  Psychiatric: Her affect is blunt. She is slowed.      Data Reviewed: Basic Metabolic Panel:  Recent Labs Lab 10/08/16 0456 10/11/16 0435  CREATININE 1.04* 1.12*   CBC:  Recent Labs Lab 10/08/16 0456  WBC 6.8  HGB 13.0  HCT 38.2  MCV 81.2  PLT 239   BNP (last 3 results)  Recent Labs  02/23/16 1617 08/09/16 1550 08/31/16 2145  BNP 181.0* 311.0* 163.0*     CBG:  Recent Labs Lab 10/11/16 1142 10/11/16 1645 10/11/16 2237 10/12/16 0807 10/12/16 1119  GLUCAP 279* 248* 244* 268* 256*     Scheduled Meds: . amLODipine  10 mg Oral Daily  . ARIPiprazole  5 mg Oral Daily  . citalopram  20 mg Oral Daily  . clonazePAM  0.25 mg Oral BID  . conjugated estrogens  1 Applicatorful Vaginal Once per day on Mon Wed Fri  . darifenacin  15 mg Oral Daily  . desmopressin  0.2 mg Oral QHS  . docusate sodium  100 mg Oral BID  . enoxaparin (LOVENOX) injection  40 mg Subcutaneous Q12H  . feeding supplement (ENSURE ENLIVE)  237 mL Oral TID BM  . insulin aspart  0-5 Units Subcutaneous QHS  . insulin aspart  0-9 Units Subcutaneous TID WC  . insulin glargine  40 Units Subcutaneous QHS  .  iron polysaccharides  150 mg Oral TID  . loratadine  10 mg Oral Daily  . metoCLOPramide  5 mg Oral TID AC  . metoprolol succinate  50 mg Oral BID  . nystatin   Topical TID  . pantoprazole  40 mg Oral BID  . potassium chloride SA  20 mEq Oral Daily  . senna  1 tablet Oral Daily  . traZODone  50 mg Oral QHS  . vitamin E  1,000 Units Oral Daily    Assessment/Plan:  1. 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 Psychiatry would like to have on inpatient unit but the patient does not walk. Added trazodone for insomnia. Currently on Abilify, Celexa and Klonopin. 2. Type 2 diabetes-  on Lantus and sliding scale. Would rather have sugars on the higher side than too low. 3. Hyperlipidemia unspecified on atorvastatin. I stopped this medication to see if this has anything to do  with her mental status 4. Essential hypertension on Norvasc and Toprol 5. GERD on Protonix 6. History of diabetic gastroparesis on Reglan 7. History of diastolic congestive heart failure and lower extremity edema. Hold Lasix with creatinine starting to creep up. Patient must eat better. 8. Weakness. Physical therapy states that she is bedbound. The sister states that she did walk a few weeks ago at [facility] 9. Patient not keeping up with nutritional needs. Overall prognosis poor. I told the patient if she does not eat she is going to die.  Code Status:     Code Status Orders        Start     Ordered   10/05/16 1217  Do not attempt resuscitation (DNR)  Continuous    Question Answer Comment  In the event of cardiac or respiratory ARREST Do not call a "code blue"   In the event of cardiac or respiratory ARREST Do not perform Intubation, CPR, defibrillation or ACLS   In the event of cardiac or respiratory ARREST Use medication by any route, position, wound care, and other measures to relive pain and suffering. May use oxygen, suction and manual treatment of airway obstruction as needed for comfort.      10/05/16 1216    Code Status History    Date Active Date Inactive Code Status Order ID Comments User Context   09/24/2016  5:31 PM 10/05/2016 12:16 PM Full Code [id number]  Gladstone Lighter, MD Inpatient   09/01/2016  2:17 AM 09/05/2016  4:28 PM Full Code [id number]  Harvie Bridge, DO Inpatient   08/09/2016  8:27 PM 08/12/2016  8:15 PM Full Code [id number]  Henreitta Leber, MD Inpatient   09/30/2015  2:23 PM 10/07/2015  5:37 PM Full Code [id number]  Loletha Grayer, MD ED   09/05/2015  5:56 PM 09/09/2015  5:38 PM DNR [id number]  Loletha Grayer, MD ED    Advance Directive Documentation   [place] Most Recent Value  Type of Advance Directive  Living will  Pre-existing out of facility DNR order (yellow form or pink MOST form)  No data  "MOST" Form in Place?  No data      Disposition Plan: No plan  Consultants:  Psychiatry  Time spent: 35 minutes. Spoke with sister on the phone Again today  Loletha Grayer  [employer]

## 2016-10-12 NOTE — Consult Note (Signed)
[facility] Psychiatry Consult   Reason for Consult:  Consult for 75 year old woman seen previously a few days ago. Reconsult because of concern about depression Referring Physician:  [place] Patient Identification: Carolyn Valdez MRN:  [id number] Principal Diagnosis: Severe major depression, single episode, with psychotic features [facility]) Diagnosis:   Patient Active Problem List   Diagnosis Date Noted  . DNR (do not resuscitate) [Z66] 10/05/2016  . Palliative care by specialist [Z51.5] 10/05/2016  . Depression, major, recurrent, severe with psychosis ([place]) [F33.3] 10/05/2016  . Severe major depression, single episode, with psychotic features ([place]) [F32.3] 10/04/2016  . Cellulitis of leg, right [L03.115] 09/29/2016  . Proctitis [K62.89] 09/29/2016  . Hypercapnia [R06.89] 09/29/2016  . Acute urinary retention [R33.8] 09/29/2016  . UTI (urinary tract infection) [N39.0] 09/29/2016  . Generalized weakness [R53.1] 09/29/2016  . Subacute delirium [F05] 09/28/2016  . Gastrointestinal hemorrhage [K92.2]   . Diffuse abdominal pain [R10.84]   . Altered mental status [R41.82] 09/24/2016  . Pressure injury of skin [L89.90] 09/02/2016  . Respiratory failure with hypoxia ([place]) [J96.91] 09/01/2016  . Lymphedema [I89.0] 08/16/2016  . CHF (congestive heart failure) ([place]) [I50.9] 08/09/2016  . Congestive heart failure ([place]) [I50.9] 11/25/2015  . Nocturia [R35.1] 11/05/2015  . Urinary frequency [R35.0] 11/05/2015  . Acute respiratory failure with hypoxia ([place]) [J96.01] 09/05/2015  . Recurrent UTI [N39.0] 04/20/2015  . Incontinence [R32] 04/20/2015  . Diabetes mellitus, type 2 ([place]) [E11.9] 04/17/2015  . ESBL (extended spectrum beta-lactamase) producing bacteria infection [A49.9, Z16.12] 04/17/2015  . BP (high blood pressure) [I10] 04/17/2015  . Frequent UTI [N39.0] 04/17/2015  . Absence of bladder continence [R32] 04/17/2015  . Iron deficiency anemia [D50.9] 03/08/2015  . Absolute anemia [D64.9]  09/25/2014  . Abdominal pain, lower [R10.30] 09/25/2014  . Urge incontinence [N39.41] 02/19/2013  . FOM (frequency of micturition) [R35.0] 02/19/2013  . Bladder infection, chronic [N30.20] 08/11/2012  . Difficult or painful urination [R30.0] 07/26/2012  . Lower urinary tract infection [N39.0] 12/31/2011  . Diabetes mellitus ([place]) [E11.9] 12/31/2011    Total Time spent with patient: 20 minutes  Subjective:   Carolyn Valdez is a 75 y.o. female patient admitted with patient was not able to give any information. Follow-up for Tuesday the 26th. Spoke with the patient. Spoke with her sister and niece as well. Both sister and niece confirmed for me again that the current state of the patient is not her baseline either mentally or physically. We agreed however that she is showing a little bit of improvement the last couple of days starting to move her hands a bit. On interview with me today the patient actually answered a couple of questions appropriately without excessive rudeness. She wouldn't talk at any length. Still very withdrawn. HPI:  Tried to interview patient spent a fair bit of time trying to coax her into conversation. Chart reviewed. Labs reviewed. Spoke with her sister on the telephone. This 72 year old woman who I saw several days ago I was asked to reconsult on today. When I saw her a few days ago she seemed in fairly good shape. Apparently in the last several days she has had a profound change in her mental state. When I came to see her this evening she appeared to be awake with her eyes open but did not communicate with me at all. She did say the word yes at one point when I ask her if she could speak but then would not respond verbally to anything else. Furthermore she wouldn't respond to any  of my requests that she move her hand or close her eyes or do anything else. She continued to keep her eyes open but did not really make much eye contact and instead just stared around the room. Affect  appeared to be completely flat. She frequently would put her hands up on her head as though she were in some kind of distress. Apparently she has been not communicating with most of the staff over the last couple days. I spoke with her sister today however who said that the patient "talked all day". He said that the patient can communicate at times when she wants to but that she seems mentally to be very different than usual. So far none of the labs are work up shows anything that would account for this kind of profound change. MRI unremarkable. All the labs pretty unremarkable.  Social history: Patient at baseline lives alone. Sounds like is a fair bit of weird drama in the family according to what the sister described with a lot of drama and conflict between several members of the family.  Medical history: Multiple medical problems.  Substance abuse history: Nonidentified  Past Psychiatric History: There is nothing to document that the patient has had any severe depression in the past. No psychiatric hospitalization no history of psychosis no history of bipolar disorder. She was not on any psychiatric medicine except a chronic dose of Klonopin. Sister says she's never seen her relative be like this before.  Risk to Self: Is patient at risk for suicide?: No Risk to Others:   Prior Inpatient Therapy:   Prior Outpatient Therapy:    Past Medical History:  Past Medical History:  Diagnosis Date  . Anxiety   . Breast cancer ([place])    16 yrs ago  . CHF (congestive heart failure) ([place])   . Diabetes mellitus without complication ([place])   . DJD (degenerative joint disease)   . Dysuria   . H/O total knee replacement    right  . HTN (hypertension)   . Hypercholesteremia   . Kidney stone   . Obesity   . Recurrent urinary tract infection   . SVT (supraventricular tachycardia) ([place])   . Urinary incontinence   . Vaginal atrophy   . Yeast vaginitis     Past Surgical History:  Procedure Laterality  Date  . CARDIAC CATHETERIZATION    . CHOLECYSTECTOMY    . FOOT SURGERY    . JOINT REPLACEMENT    . MASTECTOMY Right 1997  . NASAL SEPTUM SURGERY    . PERIPHERAL VASCULAR CATHETERIZATION N/A 07/08/2015   Procedure: PICC Line Insertion;  Surgeon: Algernon Huxley, MD;  Location: [facility];  Service: Cardiovascular;  Laterality: N/A;  . TONSILLECTOMY    . Vocal cords     Family History:  Family History  Problem Relation Age of Onset  . Lung cancer Father   . Hematuria Mother   . Lung cancer Mother   . Kidney disease Neg Hx   . Bladder Cancer Neg Hx   . Breast cancer Neg Hx    Family Psychiatric  History: Nothing identified Social History:  History  Alcohol Use No     History  Drug Use No    Social History   Social History  . Marital status: Widowed    Spouse name: N/A  . Number of children: N/A  . Years of education: N/A   Social History Main Topics  . Smoking status: Former Research scientist (life sciences)  . Smokeless tobacco:  Former Systems developer    Quit date: 10/29/1995  . Alcohol use No  . Drug use: No  . Sexual activity: Not Asked   Other Topics Concern  . None   Social History Narrative  . None   Additional Social History:    Allergies:   Allergies  Allergen Reactions  . Sulfa Antibiotics Hives  . Biaxin [Clarithromycin] Hives  . Influenza A (H1n1) Monoval Vac Other (See Comments)    Pt states that she was told by her MD not to get the influenza vaccine.    . Morphine Other (See Comments)    Reaction:  Dizziness and confusion   . Pyridium [Phenazopyridine Hcl] Other (See Comments)    Reaction:  Unknown   . Ceftriaxone Anxiety  . Latex Rash  . Prednisone Rash  . Tape Rash    Labs:  Results for orders placed or performed during the hospital encounter of 09/24/16 (from the past 48 hour(s))  Glucose, capillary     Status: Abnormal   Collection Time: 10/10/16  4:24 PM  Result Value Ref Range   Glucose-Capillary 157 (H) 65 - 99 mg/dL  Glucose, capillary     Status:  Abnormal   Collection Time: 10/10/16  9:00 PM  Result Value Ref Range   Glucose-Capillary 297 (H) 65 - 99 mg/dL   Comment 1 Notify RN   Glucose, capillary     Status: Abnormal   Collection Time: 10/11/16 12:11 AM  Result Value Ref Range   Glucose-Capillary 258 (H) 65 - 99 mg/dL  Creatinine, serum     Status: Abnormal   Collection Time: 10/11/16  4:35 AM  Result Value Ref Range   Creatinine, Ser 1.12 (H) 0.44 - 1.00 mg/dL   GFR calc non Af Amer 48 (L) >60 mL/min   GFR calc Af Amer 55 (L) >60 mL/min    Comment: (NOTE) The eGFR has been calculated using the CKD EPI equation. This calculation has not been validated in all clinical situations. eGFR's persistently <60 mL/min signify possible Chronic Kidney Disease.   Glucose, capillary     Status: Abnormal   Collection Time: 10/11/16  7:10 AM  Result Value Ref Range   Glucose-Capillary 260 (H) 65 - 99 mg/dL  Glucose, capillary     Status: Abnormal   Collection Time: 10/11/16 11:42 AM  Result Value Ref Range   Glucose-Capillary 279 (H) 65 - 99 mg/dL  Glucose, capillary     Status: Abnormal   Collection Time: 10/11/16  4:45 PM  Result Value Ref Range   Glucose-Capillary 248 (H) 65 - 99 mg/dL  Glucose, capillary     Status: Abnormal   Collection Time: 10/11/16 10:37 PM  Result Value Ref Range   Glucose-Capillary 244 (H) 65 - 99 mg/dL  Glucose, capillary     Status: Abnormal   Collection Time: 10/12/16  8:07 AM  Result Value Ref Range   Glucose-Capillary 268 (H) 65 - 99 mg/dL   Comment 1 Notify RN   Glucose, capillary     Status: Abnormal   Collection Time: 10/12/16 11:19 AM  Result Value Ref Range   Glucose-Capillary 256 (H) 65 - 99 mg/dL    Current Facility-Administered Medications  Medication Dose Route Frequency Provider Last Rate Last Dose  . acetaminophen (TYLENOL) tablet 650 mg  650 mg Oral Q6H PRN Gladstone Lighter, MD   650 mg at 09/27/16 0920   Or  . acetaminophen (TYLENOL) suppository 650 mg  650 mg Rectal Q6H PRN  Gladstone Lighter, MD      .  alum & mag hydroxide-simeth (MAALOX/MYLANTA) 200-200-20 MG/5ML suspension 30 mL  30 mL Oral Q4H PRN Theodoro Grist, MD   30 mL at 09/25/16 2359  . amLODipine (NORVASC) tablet 10 mg  10 mg Oral Daily Theodoro Grist, MD   10 mg at 10/12/16 1122  . [START ON 10/13/2016] ARIPiprazole (ABILIFY) tablet 7.5 mg  7.5 mg Oral Daily Gonzella Lex, MD      . Derrill Memo ON 10/13/2016] citalopram (CELEXA) tablet 30 mg  30 mg Oral Daily Gonzella Lex, MD      . clonazePAM [facility]bye [place]) tablet 0.25 mg  0.25 mg Oral BID Gonzella Lex, MD   0.25 mg at 10/12/16 1000  . conjugated estrogens (PREMARIN) vaginal cream 1 Applicatorful  1 Applicatorful Vaginal Once per day on Mon Wed Fri Gladstone Lighter, MD   1 Applicatorful at 16/01/09 3235  . darifenacin (ENABLEX) 24 hr tablet 15 mg  15 mg Oral Daily Theodoro Grist, MD   15 mg at 10/12/16 1117  . desmopressin (DDAVP) tablet 0.2 mg  0.2 mg Oral QHS Gladstone Lighter, MD   0.2 mg at 10/11/16 2259  . docusate sodium (COLACE) capsule 100 mg  100 mg Oral BID Napoleon Form, RPH   100 mg at 10/12/16 1124  . enoxaparin (LOVENOX) injection 40 mg  40 mg Subcutaneous Q12H Gladstone Lighter, MD   40 mg at 10/12/16 1115  . feeding supplement (ENSURE ENLIVE) (ENSURE ENLIVE) liquid 237 mL  237 mL Oral TID BM Henreitta Leber, MD   237 mL at 10/12/16 1447  . insulin aspart (novoLOG) injection 0-5 Units  0-5 Units Subcutaneous QHS Gladstone Lighter, MD   2 Units at 10/11/16 2254  . insulin aspart (novoLOG) injection 0-9 Units  0-9 Units Subcutaneous TID WC Gladstone Lighter, MD   5 Units at 10/12/16 1200  . insulin glargine (LANTUS) injection 40 Units  40 Units Subcutaneous QHS Henreitta Leber, MD   40 Units at 10/11/16 2255  . iron polysaccharides (NIFEREX) capsule 150 mg  150 mg Oral TID Gladstone Lighter, MD   150 mg at 10/12/16 1120  . loratadine (CLARITIN) tablet 10 mg  10 mg Oral Daily Gladstone Lighter, MD   10 mg at 10/12/16 1121  . metoCLOPramide (REGLAN)  tablet 5 mg  5 mg Oral TID AC Theodoro Grist, MD   5 mg at 10/12/16 1200  . metoprolol succinate (TOPROL-XL) 24 hr tablet 50 mg  50 mg Oral BID Theodoro Grist, MD   50 mg at 10/12/16 1000  . nystatin (MYCOSTATIN/NYSTOP) topical powder   Topical TID Gladstone Lighter, MD      . ondansetron [facility]) tablet 4 mg  4 mg Oral Q6H PRN Gladstone Lighter, MD   4 mg at 09/29/16 1109   Or  . ondansetron (ZOFRAN) injection 4 mg  4 mg Intravenous Q6H PRN Gladstone Lighter, MD   4 mg at 09/27/16 0950  . pantoprazole (PROTONIX) EC tablet 40 mg  40 mg Oral BID Gladstone Lighter, MD   40 mg at 10/12/16 1121  . polyvinyl alcohol (LIQUIFILM TEARS) 1.4 % ophthalmic solution 1 drop  1 drop Both Eyes PRN Theodoro Grist, MD      . potassium chloride SA (K-DUR,KLOR-CON) CR tablet 20 mEq  20 mEq Oral Daily Gladstone Lighter, MD   20 mEq at 10/12/16 1000  . senna (SENOKOT) tablet 8.6 mg  1 tablet Oral Daily Napoleon Form, RPH   8.6 mg at 10/12/16 1122  . traZODone (DESYREL) tablet 50  mg  50 mg Oral QHS Loletha Grayer, MD   50 mg at 10/11/16 2259  . vitamin E capsule 1,000 Units  1,000 Units Oral Daily Gladstone Lighter, MD   1,000 Units at 10/12/16 1115    Musculoskeletal: Strength & Muscle Tone: within normal limits Gait & Station: unable to stand Patient leans: N/A  Psychiatric Specialty Exam: Physical Exam  Nursing note and vitals reviewed. Constitutional: She appears well-developed and well-nourished.  HENT:  Head: Normocephalic and atraumatic.  Eyes: Conjunctivae are normal. Pupils are equal, round, and reactive to light.  Neck: Normal range of motion.  Cardiovascular: Normal heart sounds.   Respiratory: Effort normal.  GI: Soft.  Musculoskeletal: Normal range of motion.  Neurological: She is alert.  Skin: Skin is warm and dry.  Psychiatric: Her affect is blunt and inappropriate. Her speech is delayed. She is withdrawn. She exhibits abnormal recent memory.    Review of Systems  Unable to perform ROS:  Psychiatric disorder    Blood pressure (!) 154/51, pulse 68, temperature 97.9 F (36.6 C), temperature source Oral, resp. rate 17, height 5' 8"  (1.727 m), weight 131.5 kg (290 lb), SpO2 99 %.Body mass index is 44.09 kg/m.  General Appearance: Fairly Groomed  Eye Contact:  Minimal  Speech:  Negative  Volume:  Decreased  Mood:  Negative  Affect:  Negative  Thought Process:  NA  Orientation:  Negative  Thought Content:  Negative  Suicidal Thoughts:  Unknown  Homicidal Thoughts:  Unknown  Memory:  Negative  Judgement:  Negative  Insight:  Negative  Psychomotor Activity:  Negative  Concentration:  Concentration: Negative  Recall:  Negative  Fund of Knowledge:  Negative  Language:  Negative  Akathisia:  Negative  Handed:  Right  AIMS (if indicated):     Assets:  Social Support  ADL's:  Impaired  Cognition:  Impaired,  Severe  Sleep:        Treatment Plan Summary: Daily contact with patient to assess and evaluate symptoms and progress in treatment, Medication management and Plan Increasing dose of Abilify to 7.5 mg and Celexa to 30 mg. Spoke with the sister and the knees. Told the patient about the plan as well. Continue to follow-up regularly. Hope that the patient could get some function walking back soon.  Disposition: Recommend psychiatric Inpatient admission when medically cleared. Supportive therapy provided about ongoing stressors.  Alethia Berthold, MD 10/12/2016 3:47 PM

## 2016-10-12 NOTE — Progress Notes (Signed)
Inpatient Diabetes Program Recommendations  AACE/ADA: New Consensus Statement on Inpatient Glycemic Control (2015)  Target Ranges:  Prepandial:   less than 140 mg/dL      Peak postprandial:   less than 180 mg/dL (1-2 hours)      Critically ill patients:  140 - 180 mg/dL   Lab Results  Component Value Date   GLUCAP 268 (H) 10/12/2016   HGBA1C 6.8 (H) 08/12/2016   Results for NORRIS, BODLEY (MRN [id number]) as of 10/12/2016 09:27  Ref. Range 10/11/2016 07:10 10/11/2016 11:42 10/11/2016 16:45 10/11/2016 22:37 10/12/2016 08:07  Glucose-Capillary Latest Ref Range: 65 - 99 mg/dL 260 (H) 279 (H) 248 (H) 244 (H) 268 (H)   Review of Glycemic Control  Diabetes history:     DM2, poor po intake  Current orders for Inpatient glycemic control:     Novolog 0-9 units TIDAC and 0-5 units QHS, Lantus 40 units QHS  Inpatient Diabetes Program Recommendations:    Please consider discontinuing Ensure Enlive and ordering Glucerna supplements.  Thank you,  Windy Carina, RN, MSN Diabetes Coordinator Inpatient Diabetes Program [phone] (Team Pager)

## 2016-10-12 NOTE — Plan of Care (Signed)
Problem: Skin Integrity: Goal: Risk for impaired skin integrity will decrease Outcome: Progressing Educated patient on the importance of turning her to maintain skin integrity

## 2016-10-12 NOTE — Progress Notes (Signed)
Calorie Count Note Day 4  calorie count ordered 12/21  Diet: Regular Supplements: Ensure Enlive TID, Magic Cup TID  Breakfast: 1 OJ-60kcal, 1g protein  Lunch: 100% tomato soup, 2 fruit cups-170kcal, 3g protein   Supplements: 1 Ensures - 350 calories, 20gm protein Snacks: none  Total intake: 580 kcal (36% of minimum estimated needs)  24gmprotein (24% of minimum estimated needs)  Nutrition Dx:Inadequate oral intake related to acute illness (AMS) as evidenced by poor appetite, meal completion <50%  Goal: Meet >/= 80% nutrition needs  Intervention:  1. Continue feeding assistance, encouragement, and redirection at all meals 2. Continue Ensure Enlive po TID, each supplement provides 350 kcal and 20 grams of protein, Magic cup TID with meals, each supplement provides 290 kcal and 9 grams of protein 3. Snacks added- fruit with cottage cheese, yogurt

## 2016-10-13 LAB — BASIC METABOLIC PANEL
Anion gap: 8 (ref 5–15)
BUN: 31 mg/dL — AB (ref 6–20)
CO2: 32 mmol/L (ref 22–32)
CREATININE: 1.09 mg/dL — AB (ref 0.44–1.00)
Calcium: 10.1 mg/dL (ref 8.9–10.3)
Chloride: 94 mmol/L — ABNORMAL LOW (ref 101–111)
GFR calc Af Amer: 57 mL/min — ABNORMAL LOW (ref >60)
GFR, EST NON AFRICAN AMERICAN: 49 mL/min — AB (ref >60)
Glucose, Bld: 192 mg/dL — ABNORMAL HIGH (ref 65–99)
POTASSIUM: 4.4 mmol/L (ref 3.5–5.1)
SODIUM: 134 mmol/L — AB (ref 135–145)

## 2016-10-13 LAB — GLUCOSE, CAPILLARY
GLUCOSE-CAPILLARY: 272 mg/dL — AB (ref 65–99)
GLUCOSE-CAPILLARY: 274 mg/dL — AB (ref 65–99)
Glucose-Capillary: 203 mg/dL — ABNORMAL HIGH (ref 65–99)
Glucose-Capillary: 207 mg/dL — ABNORMAL HIGH (ref 65–99)

## 2016-10-13 LAB — MAGNESIUM: MAGNESIUM: 1.9 mg/dL (ref 1.7–2.4)

## 2016-10-13 MED ORDER — INSULIN ASPART 100 UNIT/ML ~~LOC~~ SOLN
0.0000 [IU] | Freq: Three times a day (TID) | SUBCUTANEOUS | Status: DC
  Administered 2016-10-13: 8 [IU] via SUBCUTANEOUS
  Administered 2016-10-14: 3 [IU] via SUBCUTANEOUS
  Administered 2016-10-14: 5 [IU] via SUBCUTANEOUS
  Administered 2016-10-14: 3 [IU] via SUBCUTANEOUS
  Administered 2016-10-15: 5 [IU] via SUBCUTANEOUS
  Administered 2016-10-15: 8 [IU] via SUBCUTANEOUS
  Administered 2016-10-15: 11 [IU] via SUBCUTANEOUS
  Administered 2016-10-16 (×2): 5 [IU] via SUBCUTANEOUS
  Administered 2016-10-16: 3 [IU] via SUBCUTANEOUS
  Administered 2016-10-17: 11 [IU] via SUBCUTANEOUS
  Administered 2016-10-17: 3 [IU] via SUBCUTANEOUS
  Administered 2016-10-17: 5 [IU] via SUBCUTANEOUS
  Administered 2016-10-18: 11 [IU] via SUBCUTANEOUS
  Administered 2016-10-18: 3 [IU] via SUBCUTANEOUS
  Administered 2016-10-18: 5 [IU] via SUBCUTANEOUS
  Administered 2016-10-19: 8 [IU] via SUBCUTANEOUS
  Administered 2016-10-19: 5 [IU] via SUBCUTANEOUS
  Administered 2016-10-19 – 2016-10-20 (×2): 8 [IU] via SUBCUTANEOUS
  Administered 2016-10-20: 5 [IU] via SUBCUTANEOUS
  Administered 2016-10-20 – 2016-10-21 (×2): 8 [IU] via SUBCUTANEOUS
  Administered 2016-10-21: 5 [IU] via SUBCUTANEOUS
  Administered 2016-10-21: 8 [IU] via SUBCUTANEOUS
  Administered 2016-10-22: 5 [IU] via SUBCUTANEOUS
  Filled 2016-10-13: qty 11
  Filled 2016-10-13: qty 8
  Filled 2016-10-13 (×3): qty 5
  Filled 2016-10-13: qty 3
  Filled 2016-10-13: qty 8
  Filled 2016-10-13: qty 5
  Filled 2016-10-13: qty 3
  Filled 2016-10-13 (×3): qty 5
  Filled 2016-10-13 (×2): qty 8
  Filled 2016-10-13: qty 5
  Filled 2016-10-13: qty 11
  Filled 2016-10-13: qty 8
  Filled 2016-10-13: qty 3
  Filled 2016-10-13: qty 11
  Filled 2016-10-13: qty 8
  Filled 2016-10-13 (×2): qty 5
  Filled 2016-10-13: qty 3
  Filled 2016-10-13 (×2): qty 8
  Filled 2016-10-13: qty 5

## 2016-10-13 NOTE — Evaluation (Signed)
Physical Therapy Evaluation Patient Details Name: JOE TANNEY MRN: [id number] DOB: 04/04/1941 Today's Date: 10/13/2016   History of Present Illness  Pt is a 75 y/o F who presents to the hospital due to AMS, confusion, urinary retention.  There is concern for possible severe constipation related proctitis.  Admiting dx: acute encephalopathy due to RLE cellulitis, stercoral proctitis.  Pt's PMH includes CHF, BLE edema, venous insufficiency, recurrent UTIs, obesity, SVT, TKA,  per pt L sciatica.  Clinical Impression  Difficult re-eval as pt struggles to consistently participate with PT.  She was able to assist minimally with bed mobility, showed absolutely no strength or ability to get to standing even with heavy cuing and multiple attempts with +2 total assist.  She was able to do some inconsistent in-bed LE exercises but more c/o pain than actually following instruction and showing good effort.     Follow Up Recommendations Home health PT;SNF (per pt's ability to participate and realistic baseline )    Equipment Recommendations       Recommendations for Other Services       Precautions / Restrictions Precautions Precautions: Fall Restrictions Weight Bearing Restrictions: No      Mobility  Bed Mobility Overal bed mobility: Needs Assistance Bed Mobility: Sit to Supine;Supine to Sit     Supine to sit: Max assist Sit to supine: Max assist   General bed mobility comments: Pt with very little ability to assist, initially uninterested in trying - did ultimately make some effort but was very limited.  Transfers Overall transfer level: Needs assistance Equipment used: Rolling walker (2 wheeled) Transfers: Sit to/from Stand Sit to Stand: +2 physical assistance;Total assist         General transfer comment: Pt unable to get to standing on multiple attempts - pt could/would not shift hips/weight forward to get up into walker  Ambulation/Gait                Stairs             Wheelchair Mobility    Modified Rankin (Stroke Patients Only)       Balance                                             Pertinent Vitals/Pain Pain Assessment:  (Pt indicates pain with any knee movement R>L, unrated)    Home Living Family/patient expects to be discharged to:: Skilled nursing facility Living Arrangements: Alone               Additional Comments: apparently recently pt has been living with sister, using [employer]    Prior Function                 Hand Dominance        Extremity/Trunk Assessment   Upper Extremity Assessment Upper Extremity Assessment: Generalized weakness;Difficult to assess due to impaired cognition (grossly shows little ability to do much AROM)    Lower Extremity Assessment Lower Extremity Assessment: Generalized weakness;Difficult to assess due to impaired cognition (unable to do formal testing)       Communication   Communication: Expressive difficulties (Pt with general terse, confused demenor)  Cognition     Overall Cognitive Status: Difficult to assess     Current Attention Level: Selective Memory: Decreased short-term memory Following Commands: Follows one step commands inconsistently Safety/Judgement: Decreased awareness of deficits;Decreased awareness of  safety   Problem Solving: Slow processing;Decreased initiation;Requires verbal cues;Requires tactile cues General Comments: Pt minimally verbal and not following cues for majority of session.      General Comments      Exercises General Exercises - Lower Extremity Heel Slides: AAROM;10 reps;Both Hip ABduction/ADduction: AAROM;10 reps;Both   Assessment/Plan    PT Assessment Patient needs continued PT services  PT Problem List Decreased strength;Decreased range of motion;Decreased activity tolerance;Decreased balance;Decreased mobility;Decreased cognition;Decreased knowledge of use of DME;Decreased safety awareness;Decreased  knowledge of precautions;Pain;Obesity;Impaired sensation          PT Treatment Interventions DME instruction;Functional mobility training;Therapeutic activities;Therapeutic exercise;Neuromuscular re-education;Cognitive remediation;Patient/family education;Manual techniques;Modalities    PT Goals (Current goals can be found in the Care Plan section)  Acute Rehab PT Goals PT Goal Formulation: Patient unable to participate in goal setting Time For Goal Achievement: 10/27/16 Potential to Achieve Goals: Fair    Frequency Min 2X/week   Barriers to discharge        Co-evaluation               End of Session Equipment Utilized During Treatment: Gait belt Activity Tolerance: Patient limited by lethargy;Patient limited by pain;Treatment limited secondary to agitation Patient left: with call bell/phone within reach;with nursing/sitter in room;in bed Nurse Communication: Mobility status         Time: 1130-1157 PT Time Calculation (min) (ACUTE ONLY): 27 min   Charges:   PT Evaluation $PT Re-evaluation: 1 Procedure     PT G Codes:        Kreg Shropshire, DPT 10/13/2016, 1:54 PM

## 2016-10-13 NOTE — Progress Notes (Signed)
Calorie Count Note Day 5  calorie count ordered 12/21  Diet: Regular Supplements: Ensure Enlive TID, Magic Cup TID Snacks: strawberry yogurt and fruit plate w/ cottage cheese  Breakfast: 1 grape juice, 1 bite grits, 2 bites [place] sausage, 1/2 OJ- 160kcal, 4g protein Lunch: 1/2 mashed potatoes, 75% collards-102kcal, 2.5g protein  Dinner- refused to eat   Supplements: 1 Ensures - 350calories, 20gm protein Snacks: 1/2 strawberry yogurt, few bites of fruit and cottage cheese- 117kcal, 4.5g protein    Total intake: 729 kcal (46% of minimum estimated needs)   31gmprotein (31% of minimum estimated needs)  Nutrition Dx:Inadequate oral intake related to acute illness (AMS) as evidenced by poor appetite, meal completion <50%  Goal: Meet >/= 80% nutrition needs  Intervention:  1. Continue feeding assistance, encouragement, and redirection at all meals 2. Continue Ensure Enlive po TID, each supplement provides 350 kcal and 20 grams of protein, Magic cup TID with meals, each supplement provides 290 kcal and 9 grams of protein 3. Snacks added- fruit with cottage cheese, yogurt

## 2016-10-13 NOTE — Progress Notes (Signed)
Patient ID: Carolyn Valdez, female   DOB: 1941/07/24, 75 y.o.   MRN: [id number]   [facility] PROGRESS NOTE  SHARONICA KRASZEWSKI TWS:[id number] DOB: 15-May-1941 DOA: 09/24/2016 PCP: Leonel Ramsay, MD  HPI/Subjective: Patient doesn't recall what we talked about yesterday with increasing her appetite. She states she is eating. Very weak with physical therapy today  Objective: Vitals:   10/13/16 1341 10/13/16 1343  BP: (!) 132/44 (!) 126/45  Pulse: 66 66  Resp: 18   Temp: 97.8 F (36.6 C)     Filed Weights   09/24/16 1159  Weight: 131.5 kg (290 lb)    ROS: Review of Systems  Unable to perform ROS: Acuity of condition  Respiratory: Negative for shortness of breath.   Cardiovascular: Negative for chest pain.  Gastrointestinal: Negative for abdominal pain.   Exam: Physical Exam  HENT:  Nose: No mucosal edema.  Mouth/Throat: No oropharyngeal exudate or posterior oropharyngeal edema.  Eyes: Conjunctivae, EOM and lids are normal. Pupils are equal, round, and reactive to light.  Neck: No JVD present. Carotid bruit is not present. No edema present. No thyroid mass and no thyromegaly present.  Cardiovascular: S1 normal and S2 normal.  Exam reveals no gallop.   Murmur heard.  Systolic murmur is present with a grade of 2/6  Pulses:      Dorsalis pedis pulses are 2+ on the right side, and 2+ on the left side.  Respiratory: No respiratory distress. She has decreased breath sounds in the right lower field and the left lower field. She has no wheezes. She has no rhonchi. She has no rales.  GI: Soft. Bowel sounds are normal. There is no tenderness.  Musculoskeletal:       Right ankle: She exhibits swelling.       Left ankle: She exhibits swelling.  Lymphadenopathy:    She has no cervical adenopathy.  Neurological: She is alert. No cranial nerve deficit.  Skin: Skin is warm. No rash noted. Nails show no clubbing.  Psychiatric: Her affect is blunt. She is slowed.      Data  Reviewed: Basic Metabolic Panel:  Recent Labs Lab 10/08/16 0456 10/11/16 0435 10/13/16 0454  NA  --   --  134*  K  --   --  4.4  CL  --   --  94*  CO2  --   --  32  GLUCOSE  --   --  192*  BUN  --   --  31*  CREATININE 1.04* 1.12* 1.09*  CALCIUM  --   --  10.1  MG  --   --  1.9   CBC:  Recent Labs Lab 10/08/16 0456  WBC 6.8  HGB 13.0  HCT 38.2  MCV 81.2  PLT 239   BNP (last 3 results)  Recent Labs  02/23/16 1617 08/09/16 1550 08/31/16 2145  BNP 181.0* 311.0* 163.0*     CBG:  Recent Labs Lab 10/12/16 1119 10/12/16 1626 10/12/16 2126 10/13/16 0723 10/13/16 1127  GLUCAP 256* 315* 205* 203* 207*     Scheduled Meds: . amLODipine  10 mg Oral Daily  . ARIPiprazole  7.5 mg Oral Daily  . citalopram  30 mg Oral Daily  . clonazePAM  0.25 mg Oral BID  . conjugated estrogens  1 Applicatorful Vaginal Once per day on Mon Wed Fri  . darifenacin  15 mg Oral Daily  . desmopressin  0.2 mg Oral QHS  . docusate sodium  100 mg Oral BID  .  enoxaparin (LOVENOX) injection  40 mg Subcutaneous Q12H  . feeding supplement (ENSURE ENLIVE)  237 mL Oral TID BM  . insulin aspart  0-5 Units Subcutaneous QHS  . insulin aspart  0-9 Units Subcutaneous TID WC  . insulin glargine  40 Units Subcutaneous QHS  . iron polysaccharides  150 mg Oral TID  . loratadine  10 mg Oral Daily  . metoCLOPramide  5 mg Oral TID AC  . metoprolol succinate  50 mg Oral BID  . nystatin   Topical TID  . pantoprazole  40 mg Oral BID  . potassium chloride SA  20 mEq Oral Daily  . senna  1 tablet Oral Daily  . traZODone  50 mg Oral QHS  . vitamin E  1,000 Units Oral Daily    Assessment/Plan:  1. 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 Psychiatry would like to have on inpatient unit but the patient does not walk. Added trazodone for insomnia. Currently on AbilifyI (ncreased dose), Celexa (increased dose )and Klonopin. 2. Type 2 diabetes-  on Lantus and sliding scale. Would rather have  sugars on the higher side than too low. 3. Hyperlipidemia unspecified on atorvastatin. I stopped this medication to see if this has anything to do with her mental status 4. Essential hypertension on Norvasc and Toprol 5. GERD on Protonix 6. History of diabetic gastroparesis on Reglan 7. History of diastolic congestive heart failure and lower extremity edema. Hold Lasix with creatinine starting to creep up. Patient must eat better. 8. Weakness. Physical therapy states that she still very weak and not able to process abated very well 9. Failure to thrive. Patient must eat better in order to survive  Code Status:     Code Status Orders        Start     Ordered   10/05/16 1217  Do not attempt resuscitation (DNR)  Continuous    Question Answer Comment  In the event of cardiac or respiratory ARREST Do not call a "code blue"   In the event of cardiac or respiratory ARREST Do not perform Intubation, CPR, defibrillation or ACLS   In the event of cardiac or respiratory ARREST Use medication by any route, position, wound care, and other measures to relive pain and suffering. May use oxygen, suction and manual treatment of airway obstruction as needed for comfort.      10/05/16 1216    Code Status History    Date Active Date Inactive Code Status Order ID Comments User Context   09/24/2016  5:31 PM 10/05/2016 12:16 PM Full Code [id number]  Gladstone Lighter, MD Inpatient   09/01/2016  2:17 AM 09/05/2016  4:28 PM Full Code [id number]  Harvie Bridge, DO Inpatient   08/09/2016  8:27 PM 08/12/2016  8:15 PM Full Code [id number]  Henreitta Leber, MD Inpatient   09/30/2015  2:23 PM 10/07/2015  5:37 PM Full Code [id number]  Loletha Grayer, MD ED   09/05/2015  5:56 PM 09/09/2015  5:38 PM DNR [id number]  Loletha Grayer, MD ED    Advance Directive Documentation   [place] Most Recent Value  Type of Advance Directive  Living will  Pre-existing out of facility DNR order (yellow form or pink MOST form)   No data  "MOST" Form in Place?  No data     Disposition Plan: No plan  Consultants:  Psychiatry  Time spent: 25 minutes. Spoke with sister on the phone Twice yesterday  Loletha Grayer  [employer]

## 2016-10-13 NOTE — Consult Note (Signed)
[facility] Consult   Reason for Consult:  Consult for 74 year old woman seen previously a few days ago. Reconsult because of concern about depression Referring Physician:  Sainani Patient Identification: Carolyn Valdez MRN:  [id number] Principal Diagnosis: Severe major depression, single episode, with psychotic features [facility]) Diagnosis:   Patient Active Problem List   Diagnosis Date Noted  . DNR (do not resuscitate) [Z66] 10/05/2016  . Palliative care by specialist [Z51.5] 10/05/2016  . Depression, major, recurrent, severe with psychosis ([place]) [F33.3] 10/05/2016  . Severe major depression, single episode, with psychotic features ([place]) [F32.3] 10/04/2016  . Cellulitis of leg, right [L03.115] 09/29/2016  . Proctitis [K62.89] 09/29/2016  . Hypercapnia [R06.89] 09/29/2016  . Acute urinary retention [R33.8] 09/29/2016  . UTI (urinary tract infection) [N39.0] 09/29/2016  . Generalized weakness [R53.1] 09/29/2016  . Subacute delirium [F05] 09/28/2016  . Gastrointestinal hemorrhage [K92.2]   . Diffuse abdominal pain [R10.84]   . Altered mental status [R41.82] 09/24/2016  . Pressure injury of skin [L89.90] 09/02/2016  . Respiratory failure with hypoxia ([place]) [J96.91] 09/01/2016  . Lymphedema [I89.0] 08/16/2016  . CHF (congestive heart failure) ([place]) [I50.9] 08/09/2016  . Congestive heart failure ([place]) [I50.9] 11/25/2015  . Nocturia [R35.1] 11/05/2015  . Urinary frequency [R35.0] 11/05/2015  . Acute respiratory failure with hypoxia ([place]) [J96.01] 09/05/2015  . Recurrent UTI [N39.0] 04/20/2015  . Incontinence [R32] 04/20/2015  . Diabetes mellitus, type 2 ([place]) [E11.9] 04/17/2015  . ESBL (extended spectrum beta-lactamase) producing bacteria infection [A49.9, Z16.12] 04/17/2015  . BP (high blood pressure) [I10] 04/17/2015  . Frequent UTI [N39.0] 04/17/2015  . Absence of bladder continence [R32] 04/17/2015  . Iron deficiency anemia [D50.9] 03/08/2015  . Absolute anemia [D64.9]  09/25/2014  . Abdominal pain, lower [R10.30] 09/25/2014  . Urge incontinence [N39.41] 02/19/2013  . FOM (frequency of micturition) [R35.0] 02/19/2013  . Bladder infection, chronic [N30.20] 08/11/2012  . Difficult or painful urination [R30.0] 07/26/2012  . Lower urinary tract infection [N39.0] 12/31/2011  . Diabetes mellitus ([place]) [E11.9] 12/31/2011    Total Time spent with patient: 20 minutes  Subjective:   Carolyn Valdez is a 75 y.o. female patient admitted with patient was not able to give any information. Follow-up for the 27th. Patient seen. Case reviewed with nursing. Patient is eating independently a little bit today. When I walked in to see her this time she spoke almost nothing to me. She claims that she did not remember seeing me yesterday. Affect flat and withdrawn still. The sitter tells me that she is actually been talking quite a bit today. Medical history: Multiple medical problems.  Substance abuse history: Nonidentified  Past Psychiatric History: There is nothing to document that the patient has had any severe depression in the past. No psychiatric hospitalization no history of psychosis no history of bipolar disorder. She was not on any psychiatric medicine except a chronic dose of Klonopin. Sister says she's never seen her relative be like this before.  Risk to Self: Is patient at risk for suicide?: No Risk to Others:   Prior Inpatient Therapy:   Prior Outpatient Therapy:    Past Medical History:  Past Medical History:  Diagnosis Date  . Anxiety   . Breast cancer ([place])    16 yrs ago  . CHF (congestive heart failure) ([place])   . Diabetes mellitus without complication ([place])   . DJD (degenerative joint disease)   . Dysuria   . H/O total knee replacement    right  . HTN (hypertension)   .  Hypercholesteremia   . Kidney stone   . Obesity   . Recurrent urinary tract infection   . SVT (supraventricular tachycardia) ([place])   . Urinary incontinence   . Vaginal atrophy    . Yeast vaginitis     Past Surgical History:  Procedure Laterality Date  . CARDIAC CATHETERIZATION    . CHOLECYSTECTOMY    . FOOT SURGERY    . JOINT REPLACEMENT    . MASTECTOMY Right 1997  . NASAL SEPTUM SURGERY    . PERIPHERAL VASCULAR CATHETERIZATION N/A 07/08/2015   Procedure: PICC Line Insertion;  Surgeon: Algernon Huxley, MD;  Location: [facility];  Service: Cardiovascular;  Laterality: N/A;  . TONSILLECTOMY    . Vocal cords     Family History:  Family History  Problem Relation Age of Onset  . Lung cancer Father   . Hematuria Mother   . Lung cancer Mother   . Kidney disease Neg Hx   . Bladder Cancer Neg Hx   . Breast cancer Neg Hx    Family Psychiatric  History: Nothing identified Social History:  History  Alcohol Use No     History  Drug Use No    Social History   Social History  . Marital status: Widowed    Spouse name: N/A  . Number of children: N/A  . Years of education: N/A   Social History Main Topics  . Smoking status: Former Research scientist (life sciences)  . Smokeless tobacco: Former Systems developer    Quit date: 10/29/1995  . Alcohol use No  . Drug use: No  . Sexual activity: Not Asked   Other Topics Concern  . None   Social History Narrative  . None   Additional Social History:    Allergies:   Allergies  Allergen Reactions  . Sulfa Antibiotics Hives  . Biaxin [Clarithromycin] Hives  . Influenza A (H1n1) Monoval Vac Other (See Comments)    Pt states that she was told by her MD not to get the influenza vaccine.    . Morphine Other (See Comments)    Reaction:  Dizziness and confusion   . Pyridium [Phenazopyridine Hcl] Other (See Comments)    Reaction:  Unknown   . Ceftriaxone Anxiety  . Latex Rash  . Prednisone Rash  . Tape Rash    Labs:  Results for orders placed or performed during the hospital encounter of 09/24/16 (from the past 48 hour(s))  Glucose, capillary     Status: Abnormal   Collection Time: 10/11/16 10:37 PM  Result Value Ref Range    Glucose-Capillary 244 (H) 65 - 99 mg/dL  Glucose, capillary     Status: Abnormal   Collection Time: 10/12/16  8:07 AM  Result Value Ref Range   Glucose-Capillary 268 (H) 65 - 99 mg/dL   Comment 1 Notify RN   Glucose, capillary     Status: Abnormal   Collection Time: 10/12/16 11:19 AM  Result Value Ref Range   Glucose-Capillary 256 (H) 65 - 99 mg/dL  Glucose, capillary     Status: Abnormal   Collection Time: 10/12/16  4:26 PM  Result Value Ref Range   Glucose-Capillary 315 (H) 65 - 99 mg/dL  Glucose, capillary     Status: Abnormal   Collection Time: 10/12/16  9:26 PM  Result Value Ref Range   Glucose-Capillary 205 (H) 65 - 99 mg/dL   Comment 1 Notify RN   Basic metabolic panel     Status: Abnormal   Collection Time: 10/13/16  4:54  AM  Result Value Ref Range   Sodium 134 (L) 135 - 145 mmol/L   Potassium 4.4 3.5 - 5.1 mmol/L   Chloride 94 (L) 101 - 111 mmol/L   CO2 32 22 - 32 mmol/L   Glucose, Bld 192 (H) 65 - 99 mg/dL   BUN 31 (H) 6 - 20 mg/dL   Creatinine, Ser 1.09 (H) 0.44 - 1.00 mg/dL   Calcium 10.1 8.9 - 10.3 mg/dL   GFR calc non Af Amer 49 (L) >60 mL/min   GFR calc Af Amer 57 (L) >60 mL/min    Comment: (NOTE) The eGFR has been calculated using the CKD EPI equation. This calculation has not been validated in all clinical situations. eGFR's persistently <60 mL/min signify possible Chronic Kidney Disease.    Anion gap 8 5 - 15  Magnesium     Status: None   Collection Time: 10/13/16  4:54 AM  Result Value Ref Range   Magnesium 1.9 1.7 - 2.4 mg/dL  Glucose, capillary     Status: Abnormal   Collection Time: 10/13/16  7:23 AM  Result Value Ref Range   Glucose-Capillary 203 (H) 65 - 99 mg/dL  Glucose, capillary     Status: Abnormal   Collection Time: 10/13/16 11:27 AM  Result Value Ref Range   Glucose-Capillary 207 (H) 65 - 99 mg/dL  Glucose, capillary     Status: Abnormal   Collection Time: 10/13/16  4:28 PM  Result Value Ref Range   Glucose-Capillary 272 (H) 65 - 99  mg/dL   Comment 1 Notify RN     Current Facility-Administered Medications  Medication Dose Route Frequency Provider Last Rate Last Dose  . acetaminophen (TYLENOL) tablet 650 mg  650 mg Oral Q6H PRN Gladstone Lighter, MD   650 mg at 09/27/16 0920   Or  . acetaminophen (TYLENOL) suppository 650 mg  650 mg Rectal Q6H PRN Gladstone Lighter, MD      . alum & mag hydroxide-simeth (MAALOX/MYLANTA) 200-200-20 MG/5ML suspension 30 mL  30 mL Oral Q4H PRN Theodoro Grist, MD   30 mL at 09/25/16 2359  . amLODipine (NORVASC) tablet 10 mg  10 mg Oral Daily Theodoro Grist, MD   10 mg at 10/13/16 1113  . ARIPiprazole (ABILIFY) tablet 7.5 mg  7.5 mg Oral Daily Gonzella Lex, MD   7.5 mg at 10/13/16 1111  . citalopram (CELEXA) tablet 30 mg  30 mg Oral Daily Gonzella Lex, MD   30 mg at 10/13/16 1112  . clonazePAM (KLONOPIN) tablet 0.25 mg  0.25 mg Oral BID Gonzella Lex, MD   0.25 mg at 10/13/16 1111  . conjugated estrogens (PREMARIN) vaginal cream 1 Applicatorful  1 Applicatorful Vaginal Once per day on Mon Wed Fri Gladstone Lighter, MD   1 Applicatorful at 33/29/51 [phone]  . darifenacin (ENABLEX) 24 hr tablet 15 mg  15 mg Oral Daily Theodoro Grist, MD   15 mg at 10/13/16 1114  . desmopressin (DDAVP) tablet 0.2 mg  0.2 mg Oral QHS Gladstone Lighter, MD   0.2 mg at 10/12/16 2134  . docusate sodium (COLACE) capsule 100 mg  100 mg Oral BID Napoleon Form, RPH   100 mg at 10/13/16 1113  . enoxaparin (LOVENOX) injection 40 mg  40 mg Subcutaneous Q12H Gladstone Lighter, MD   40 mg at 10/13/16 1116  . feeding supplement (ENSURE ENLIVE) (ENSURE ENLIVE) liquid 237 mL  237 mL Oral TID BM Henreitta Leber, MD   237 mL at 10/13/16  1316  . insulin aspart (novoLOG) injection 0-15 Units  0-15 Units Subcutaneous TID WC Loletha Grayer, MD   8 Units at 10/13/16 1653  . insulin aspart (novoLOG) injection 0-5 Units  0-5 Units Subcutaneous QHS Gladstone Lighter, MD   2 Units at 10/12/16 2132  . insulin glargine (LANTUS) injection 40  Units  40 Units Subcutaneous QHS Henreitta Leber, MD   40 Units at 10/12/16 2133  . iron polysaccharides (NIFEREX) capsule 150 mg  150 mg Oral TID Gladstone Lighter, MD   150 mg at 10/13/16 1652  . loratadine (CLARITIN) tablet 10 mg  10 mg Oral Daily Gladstone Lighter, MD   10 mg at 10/13/16 1113  . metoCLOPramide (REGLAN) tablet 5 mg  5 mg Oral TID AC Theodoro Grist, MD   5 mg at 10/13/16 1652  . metoprolol succinate (TOPROL-XL) 24 hr tablet 50 mg  50 mg Oral BID Theodoro Grist, MD   50 mg at 10/13/16 1112  . nystatin (MYCOSTATIN/NYSTOP) topical powder   Topical TID Gladstone Lighter, MD      . ondansetron [facility]) tablet 4 mg  4 mg Oral Q6H PRN Gladstone Lighter, MD   4 mg at 09/29/16 1109   Or  . ondansetron (ZOFRAN) injection 4 mg  4 mg Intravenous Q6H PRN Gladstone Lighter, MD   4 mg at 09/27/16 0950  . pantoprazole (PROTONIX) EC tablet 40 mg  40 mg Oral BID Gladstone Lighter, MD   40 mg at 10/13/16 1115  . polyvinyl alcohol (LIQUIFILM TEARS) 1.4 % ophthalmic solution 1 drop  1 drop Both Eyes PRN Theodoro Grist, MD      . potassium chloride SA (K-DUR,KLOR-CON) CR tablet 20 mEq  20 mEq Oral Daily Gladstone Lighter, MD   20 mEq at 10/13/16 1115  . senna (SENOKOT) tablet 8.6 mg  1 tablet Oral Daily Napoleon Form, RPH   8.6 mg at 10/13/16 1111  . traZODone (DESYREL) tablet 50 mg  50 mg Oral QHS Loletha Grayer, MD   50 mg at 10/12/16 2133  . vitamin E capsule 1,000 Units  1,000 Units Oral Daily Gladstone Lighter, MD   1,000 Units at 10/12/16 1115    Musculoskeletal: Strength & Muscle Tone: within normal limits Gait & Station: unable to stand Patient leans: N/A  Psychiatric Specialty Exam: Physical Exam  Nursing note and vitals reviewed. Constitutional: She appears well-developed and well-nourished.  HENT:  Head: Normocephalic and atraumatic.  Eyes: Conjunctivae are normal. Pupils are equal, round, and reactive to light.  Neck: Normal range of motion.  Cardiovascular: Normal heart  sounds.   Respiratory: Effort normal.  GI: Soft.  Musculoskeletal: Normal range of motion.  Neurological: She is alert.  Skin: Skin is warm and dry.  Psychiatric: Her affect is blunt and inappropriate. Her speech is delayed. She is withdrawn. She exhibits abnormal recent memory.    Review of Systems  Unable to perform ROS: Psychiatric disorder    Blood pressure (!) 126/45, pulse 66, temperature 97.8 F (36.6 C), temperature source Oral, resp. rate 18, height _0  (1.727 m), weight 131.5 kg (290 lb), SpO2 98 %.Body mass index is 44.09 kg/m.  General Appearance: Fairly Groomed  Eye Contact:  Minimal  Speech:  Negative  Volume:  Decreased  Mood:  Negative  Affect:  Negative  Thought Process:  NA  Orientation:  Negative  Thought Content:  Negative  Suicidal Thoughts:  Unknown  Homicidal Thoughts:  Unknown  Memory:  Negative  Judgement:  Negative  Insight:  Negative  Psychomotor Activity:  Negative  Concentration:  Concentration: Negative  Recall:  Negative  Fund of Knowledge:  Negative  Language:  Negative  Akathisia:  Negative  Handed:  Right  AIMS (if indicated):     Assets:  Social Support  ADL's:  Impaired  Cognition:  Impaired,  Severe  Sleep:        Treatment Plan Summary: Daily contact with patient to assess and evaluate symptoms and progress in treatment, Medication management and Plan Seems to be tolerating increased doses of psychiatric medicine. Given that it looks like there is little hope at this point of getting her to a geriatric psychiatry hospital I will discontinue the IVC so that a sitter is not necessary. I don't think she is at risk to harm herself in the room. Continue current medicine and daily follow-up.  Disposition: Recommend psychiatric Inpatient admission when medically cleared. Supportive therapy provided about ongoing stressors.  Alethia Berthold, MD 10/13/2016 5:31 PM

## 2016-10-14 DIAGNOSIS — R531 Weakness: Secondary | ICD-10-CM

## 2016-10-14 LAB — GLUCOSE, CAPILLARY
Glucose-Capillary: 189 mg/dL — ABNORMAL HIGH (ref 65–99)
Glucose-Capillary: 226 mg/dL — ABNORMAL HIGH (ref 65–99)
Glucose-Capillary: 162 mg/dL — ABNORMAL HIGH (ref 65–99)
Glucose-Capillary: 170 mg/dL — ABNORMAL HIGH (ref 65–99)

## 2016-10-14 NOTE — Progress Notes (Signed)
Calorie Count Note Day 6  calorie count ordered 12/21  Diet: Regular Supplements: Ensure Enlive TID, Magic Cup TID Snacks: strawberry yogurt and fruit plate w/ cottage cheese  Breakfast: 1 OJ, 1 fruit cup-123kcal, 2g protein  Lunch: not documented  Dinner- not documented    Supplements: 1 Ensures - 350calories, 20gm protein Snacks: not documented   Total intake: 729kcal (30% of minimum estimated needs)   31gmprotein (22% of minimum estimated needs)  Nutrition Dx:Inadequate oral intake related to acute illness (AMS) as evidenced by poor appetite, meal completion <50%  Goal: Meet >/= 80% nutrition needs  Intervention:   REQUESTED UPDATED WEIGHT   1. Continue feeding assistance, encouragement, and redirection at all meals 2. Continue Ensure Enlive po TID, each supplement provides 350 kcal and 20 grams of protein, Magic cup TID with meals, each supplement provides 290 kcal and 9 grams of protein 3. Snacks added- fruit with cottage cheese, yogurt

## 2016-10-14 NOTE — Progress Notes (Signed)
Patient ID: Carolyn Valdez, female   DOB: 08-08-1941, 75 y.o.   MRN: [id number]   [employer] PROGRESS NOTE  Carolyn Valdez YQM:[id number] DOB: 03-Oct-1941 DOA: 09/24/2016 PCP: Carolyn Ramsay, MD  HPI/Subjective: Patient states she does not feel well but can't elaborate. Very limited in her conversation. Unable to comprehend what we talked about yesterday.  Objective: Vitals:   10/14/16 0536 10/14/16 0949  BP: (!) 149/62 (!) 142/74  Pulse: 77   Resp:    Temp: 99.6 F (37.6 C)     Filed Weights   09/24/16 1159 10/14/16 1432  Weight: 131.5 kg (290 lb) 114.7 kg (252 lb 12.8 oz)    ROS: Review of Systems  Unable to perform ROS: Acuity of condition  Respiratory: Negative for shortness of breath.   Cardiovascular: Negative for chest pain.  Gastrointestinal: Negative for abdominal pain.   Exam: Physical Exam  HENT:  Nose: No mucosal edema.  Mouth/Throat: No oropharyngeal exudate or posterior oropharyngeal edema.  Eyes: Conjunctivae, EOM and lids are normal. Pupils are equal, round, and reactive to light.  Neck: No JVD present. Carotid bruit is not present. No edema present. No thyroid mass and no thyromegaly present.  Cardiovascular: S1 normal and S2 normal.  Exam reveals no gallop.   Murmur heard.  Systolic murmur is present with a grade of 2/6  Pulses:      Dorsalis pedis pulses are 2+ on the right side, and 2+ on the left side.  Respiratory: No respiratory distress. She has decreased breath sounds in the right lower field and the left lower field. She has no wheezes. She has no rhonchi. She has no rales.  GI: Soft. Bowel sounds are normal. There is no tenderness.  Musculoskeletal:       Right ankle: She exhibits swelling.       Left ankle: She exhibits swelling.  Lymphadenopathy:    She has no cervical adenopathy.  Neurological: She is alert. No cranial nerve deficit.  Skin: Skin is warm. No rash noted. Nails show no clubbing.  Psychiatric: Her affect is blunt. She is  slowed.      Data Reviewed: Basic Metabolic Panel:  Recent Labs Lab 10/08/16 0456 10/11/16 0435 10/13/16 0454  NA  --   --  134*  K  --   --  4.4  CL  --   --  94*  CO2  --   --  32  GLUCOSE  --   --  192*  BUN  --   --  31*  CREATININE 1.04* 1.12* 1.09*  CALCIUM  --   --  10.1  MG  --   --  1.9   CBC:  Recent Labs Lab 10/08/16 0456  WBC 6.8  HGB 13.0  HCT 38.2  MCV 81.2  PLT 239   BNP (last 3 results)  Recent Labs  02/23/16 1617 08/09/16 1550 08/31/16 2145  BNP 181.0* 311.0* 163.0*     CBG:  Recent Labs Lab 10/13/16 1628 10/13/16 2132 10/14/16 0743 10/14/16 1146 10/14/16 1636  GLUCAP 272* 274* 189* 226* 162*     Scheduled Meds: . amLODipine  10 mg Oral Daily  . ARIPiprazole  7.5 mg Oral Daily  . citalopram  30 mg Oral Daily  . clonazePAM  0.25 mg Oral BID  . conjugated estrogens  1 Applicatorful Vaginal Once per day on Mon Wed Fri  . darifenacin  15 mg Oral Daily  . desmopressin  0.2 mg Oral QHS  .  docusate sodium  100 mg Oral BID  . enoxaparin (LOVENOX) injection  40 mg Subcutaneous Q12H  . feeding supplement (ENSURE ENLIVE)  237 mL Oral TID BM  . insulin aspart  0-15 Units Subcutaneous TID WC  . insulin aspart  0-5 Units Subcutaneous QHS  . insulin glargine  40 Units Subcutaneous QHS  . iron polysaccharides  150 mg Oral TID  . loratadine  10 mg Oral Daily  . metoCLOPramide  5 mg Oral TID AC  . metoprolol succinate  50 mg Oral BID  . nystatin   Topical TID  . pantoprazole  40 mg Oral BID  . potassium chloride SA  20 mEq Oral Daily  . senna  1 tablet Oral Daily  . traZODone  50 mg Oral QHS  . vitamin E  1,000 Units Oral Daily    Assessment/Plan:  1. 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 Case discussed with Carolyn Valdez psychiatry and he will start ECT on Wednesday if no improvement.  Added trazodone for insomnia. Currently on AbilifyI (increased dose), Celexa (increased dose )and Klonopin. 2. Type 2 diabetes-  on  Lantus and sliding scale. Would rather have sugars on the higher side than too low. 3. Hyperlipidemia unspecified on atorvastatin. I stopped this medication to see if this has anything to do with her mental status 4. Essential hypertension on Norvasc and Toprol 5. GERD on Protonix 6. History of diabetic gastroparesis on Reglan 7. History of diastolic congestive heart failure and lower extremity edema. Hold Lasix with creatinine starting to creep up. Patient must eat better. 8. Weakness. Physical therapy states that she still very weak and not able to process abated very well 9. Failure to thrive. Patient must eat better in order to survive   Code Status:     Code Status Orders        Start     Ordered   10/05/16 1217  Do not attempt resuscitation (DNR)  Continuous    Question Answer Comment  In the event of cardiac or respiratory ARREST Do not call a "code blue"   In the event of cardiac or respiratory ARREST Do not perform Intubation, CPR, defibrillation or ACLS   In the event of cardiac or respiratory ARREST Use medication by any route, position, wound care, and other measures to relive pain and suffering. May use oxygen, suction and manual treatment of airway obstruction as needed for comfort.      10/05/16 1216    Code Status History    Date Active Date Inactive Code Status Order ID Comments User Context   09/24/2016  5:31 PM 10/05/2016 12:16 PM Full Code [id number]  Carolyn Lighter, MD Inpatient   09/01/2016  2:17 AM 09/05/2016  4:28 PM Full Code [id number]  Carolyn Bridge, DO Inpatient   08/09/2016  8:27 PM 08/12/2016  8:15 PM Full Code [id number]  Carolyn Leber, MD Inpatient   09/30/2015  2:23 PM 10/07/2015  5:37 PM Full Code [id number]  Carolyn Grayer, MD ED   09/05/2015  5:56 PM 09/09/2015  5:38 PM DNR [id number]  Carolyn Grayer, MD ED    Advance Directive Documentation   Carolyn Valdez Most Recent Value  Type of Advance Directive  Living will  Pre-existing out of  facility DNR order (yellow form or pink MOST form)  No data  "MOST" Form in Place?  No data     Disposition Plan: ECT to start on Wednesday. I spoke with the sister and a niece on the phone.  I explained that our only disposition will be home at some point with 24 7 care. They are looking into help back and give insulin injections. The sister has a sick husband at home and won't be able to give for insulin injections. The niece works a Architect. Both family members also with 30 minutes away from her. The patient is not a candidate for Geri-psychiatric care because she is a total care patient. The patient is not a candidate for rehabilitation because she does not participate with physical therapy. The patient cannot go to the psychiatric floor because the patient does not walk. At this point I do not have a safe disposition at home because we need to figure out if the 24 7 care can give insulin injections or not. If the patient were to go home or to rehabilitation ECT cannot be done. Family interested in starting ECT anything that can help the patient.  Consultants:  Psychiatry  Time spent: 40 minutes. Spoke with sister and niece on the phone  Carolyn Valdez  [employer]

## 2016-10-14 NOTE — Consult Note (Signed)
[facility] Consult   Reason for Consult:  This is a follow-up consult for this 75 year old woman with depression Referring Physician:  Leslye Peer Patient Identification: Carolyn Valdez MRN:  [id number] Principal Diagnosis: Severe major depression, single episode, with psychotic features [facility]) Diagnosis:   Patient Active Problem List   Diagnosis Date Noted  . DNR (do not resuscitate) [Z66] 10/05/2016  . Palliative care by specialist [Z51.5] 10/05/2016  . Depression, major, recurrent, severe with psychosis ([place]) [F33.3] 10/05/2016  . Severe major depression, single episode, with psychotic features ([place]) [F32.3] 10/04/2016  . Cellulitis of leg, right [L03.115] 09/29/2016  . Proctitis [K62.89] 09/29/2016  . Hypercapnia [R06.89] 09/29/2016  . Acute urinary retention [R33.8] 09/29/2016  . UTI (urinary tract infection) [N39.0] 09/29/2016  . Generalized weakness [R53.1] 09/29/2016  . Subacute delirium [F05] 09/28/2016  . Gastrointestinal hemorrhage [K92.2]   . Diffuse abdominal pain [R10.84]   . Altered mental status [R41.82] 09/24/2016  . Pressure injury of skin [L89.90] 09/02/2016  . Respiratory failure with hypoxia ([place]) [J96.91] 09/01/2016  . Lymphedema [I89.0] 08/16/2016  . CHF (congestive heart failure) ([place]) [I50.9] 08/09/2016  . Congestive heart failure ([place]) [I50.9] 11/25/2015  . Nocturia [R35.1] 11/05/2015  . Urinary frequency [R35.0] 11/05/2015  . Acute respiratory failure with hypoxia ([place]) [J96.01] 09/05/2015  . Recurrent UTI [N39.0] 04/20/2015  . Incontinence [R32] 04/20/2015  . Diabetes mellitus, type 2 ([place]) [E11.9] 04/17/2015  . ESBL (extended spectrum beta-lactamase) producing bacteria infection [A49.9, Z16.12] 04/17/2015  . BP (high blood pressure) [I10] 04/17/2015  . Frequent UTI [N39.0] 04/17/2015  . Absence of bladder continence [R32] 04/17/2015  . Iron deficiency anemia [D50.9] 03/08/2015  . Absolute anemia [D64.9] 09/25/2014  . Abdominal pain, lower  [R10.30] 09/25/2014  . Urge incontinence [N39.41] 02/19/2013  . FOM (frequency of micturition) [R35.0] 02/19/2013  . Bladder infection, chronic [N30.20] 08/11/2012  . Difficult or painful urination [R30.0] 07/26/2012  . Lower urinary tract infection [N39.0] 12/31/2011  . Diabetes mellitus ([place]) [E11.9] 12/31/2011    Total Time spent with patient: 30 minutes  Subjective:   Carolyn Valdez is a 75 y.o. female patient admitted with patient made no comment.  HPI:  This is a follow-up note. Patient seen today. Chart reviewed. I spoke with Dr. Leslye Peer who is the hospitalist on duty today. Patient presents just as bad if not worse than she has the last several days. She appeared to be awake with her eyes open when I came into the room but did not respond to me verbally or obey any commands. She did not answer any questions I asked of her. Not able to cooperate with therapy.  Past Psychiatric History: Past history of depression possible bipolar disorder.  Risk to Self: Is patient at risk for suicide?: No Risk to Others:   Prior Inpatient Therapy:   Prior Outpatient Therapy:    Past Medical History:  Past Medical History:  Diagnosis Date  . Anxiety   . Breast cancer ([place])    16 yrs ago  . CHF (congestive heart failure) ([place])   . Diabetes mellitus without complication ([place])   . DJD (degenerative joint disease)   . Dysuria   . H/O total knee replacement    right  . HTN (hypertension)   . Hypercholesteremia   . Kidney stone   . Obesity   . Recurrent urinary tract infection   . SVT (supraventricular tachycardia) ([place])   . Urinary incontinence   . Vaginal atrophy   . Yeast vaginitis  Past Surgical History:  Procedure Laterality Date  . CARDIAC CATHETERIZATION    . CHOLECYSTECTOMY    . FOOT SURGERY    . JOINT REPLACEMENT    . MASTECTOMY Right 1997  . NASAL SEPTUM SURGERY    . PERIPHERAL VASCULAR CATHETERIZATION N/A 07/08/2015   Procedure: PICC Line Insertion;  Surgeon: Algernon Huxley, MD;  Location: [facility];  Service: Cardiovascular;  Laterality: N/A;  . TONSILLECTOMY    . Vocal cords     Family History:  Family History  Problem Relation Age of Onset  . Lung cancer Father   . Hematuria Mother   . Lung cancer Mother   . Kidney disease Neg Hx   . Bladder Cancer Neg Hx   . Breast cancer Neg Hx    Family Psychiatric  History: Some depression Social History:  History  Alcohol Use No     History  Drug Use No    Social History   Social History  . Marital status: Widowed    Spouse name: N/A  . Number of children: N/A  . Years of education: N/A   Social History Main Topics  . Smoking status: Former Research scientist (life sciences)  . Smokeless tobacco: Former Systems developer    Quit date: 10/29/1995  . Alcohol use No  . Drug use: No  . Sexual activity: Not Asked   Other Topics Concern  . None   Social History Narrative  . None   Additional Social History:    Allergies:   Allergies  Allergen Reactions  . Sulfa Antibiotics Hives  . Biaxin [Clarithromycin] Hives  . Influenza A (H1n1) Monoval Vac Other (See Comments)    Pt states that she was told by her MD not to get the influenza vaccine.    . Morphine Other (See Comments)    Reaction:  Dizziness and confusion   . Pyridium [Phenazopyridine Hcl] Other (See Comments)    Reaction:  Unknown   . Ceftriaxone Anxiety  . Latex Rash  . Prednisone Rash  . Tape Rash    Labs:  Results for orders placed or performed during the hospital encounter of 09/24/16 (from the past 48 hour(s))  Glucose, capillary     Status: Abnormal   Collection Time: 10/12/16  9:26 PM  Result Value Ref Range   Glucose-Capillary 205 (H) 65 - 99 mg/dL   Comment 1 Notify RN   Basic metabolic panel     Status: Abnormal   Collection Time: 10/13/16  4:54 AM  Result Value Ref Range   Sodium 134 (L) 135 - 145 mmol/L   Potassium 4.4 3.5 - 5.1 mmol/L   Chloride 94 (L) 101 - 111 mmol/L   CO2 32 22 - 32 mmol/L   Glucose, Bld 192 (H) 65 - 99 mg/dL    BUN 31 (H) 6 - 20 mg/dL   Creatinine, Ser 1.09 (H) 0.44 - 1.00 mg/dL   Calcium 10.1 8.9 - 10.3 mg/dL   GFR calc non Af Amer 49 (L) >60 mL/min   GFR calc Af Amer 57 (L) >60 mL/min    Comment: (NOTE) The eGFR has been calculated using the CKD EPI equation. This calculation has not been validated in all clinical situations. eGFR's persistently <60 mL/min signify possible Chronic Kidney Disease.    Anion gap 8 5 - 15  Magnesium     Status: None   Collection Time: 10/13/16  4:54 AM  Result Value Ref Range   Magnesium 1.9 1.7 - 2.4 mg/dL  Glucose,  capillary     Status: Abnormal   Collection Time: 10/13/16  7:23 AM  Result Value Ref Range   Glucose-Capillary 203 (H) 65 - 99 mg/dL  Glucose, capillary     Status: Abnormal   Collection Time: 10/13/16 11:27 AM  Result Value Ref Range   Glucose-Capillary 207 (H) 65 - 99 mg/dL  Glucose, capillary     Status: Abnormal   Collection Time: 10/13/16  4:28 PM  Result Value Ref Range   Glucose-Capillary 272 (H) 65 - 99 mg/dL   Comment 1 Notify RN   Glucose, capillary     Status: Abnormal   Collection Time: 10/13/16  9:32 PM  Result Value Ref Range   Glucose-Capillary 274 (H) 65 - 99 mg/dL   Comment 1 Notify RN   Glucose, capillary     Status: Abnormal   Collection Time: 10/14/16  7:43 AM  Result Value Ref Range   Glucose-Capillary 189 (H) 65 - 99 mg/dL  Glucose, capillary     Status: Abnormal   Collection Time: 10/14/16 11:46 AM  Result Value Ref Range   Glucose-Capillary 226 (H) 65 - 99 mg/dL  Glucose, capillary     Status: Abnormal   Collection Time: 10/14/16  4:36 PM  Result Value Ref Range   Glucose-Capillary 162 (H) 65 - 99 mg/dL    Current Facility-Administered Medications  Medication Dose Route Frequency Provider Last Rate Last Dose  . acetaminophen (TYLENOL) tablet 650 mg  650 mg Oral Q6H PRN Gladstone Lighter, MD   650 mg at 09/27/16 0920   Or  . acetaminophen (TYLENOL) suppository 650 mg  650 mg Rectal Q6H PRN Gladstone Lighter, MD      . alum & mag hydroxide-simeth (MAALOX/MYLANTA) 200-200-20 MG/5ML suspension 30 mL  30 mL Oral Q4H PRN Theodoro Grist, MD   30 mL at 09/25/16 2359  . amLODipine (NORVASC) tablet 10 mg  10 mg Oral Daily Theodoro Grist, MD   10 mg at 10/14/16 0951  . ARIPiprazole (ABILIFY) tablet 7.5 mg  7.5 mg Oral Daily Gonzella Lex, MD   7.5 mg at 10/14/16 0953  . citalopram (CELEXA) tablet 30 mg  30 mg Oral Daily Gonzella Lex, MD   30 mg at 10/14/16 0950  . clonazePAM (KLONOPIN) tablet 0.25 mg  0.25 mg Oral BID Gonzella Lex, MD   0.25 mg at 10/14/16 1660  . conjugated estrogens (PREMARIN) vaginal cream 1 Applicatorful  1 Applicatorful Vaginal Once per day on Mon Wed Fri Gladstone Lighter, MD   1 Applicatorful at 63/01/60 [phone]  . darifenacin (ENABLEX) 24 hr tablet 15 mg  15 mg Oral Daily Theodoro Grist, MD   15 mg at 10/14/16 0954  . desmopressin (DDAVP) tablet 0.2 mg  0.2 mg Oral QHS Gladstone Lighter, MD   0.2 mg at 10/13/16 2150  . docusate sodium (COLACE) capsule 100 mg  100 mg Oral BID Napoleon Form, RPH   100 mg at 10/13/16 1113  . enoxaparin (LOVENOX) injection 40 mg  40 mg Subcutaneous Q12H Gladstone Lighter, MD   40 mg at 10/14/16 0949  . feeding supplement (ENSURE ENLIVE) (ENSURE ENLIVE) liquid 237 mL  237 mL Oral TID BM Henreitta Leber, MD   237 mL at 10/14/16 1514  . insulin aspart (novoLOG) injection 0-15 Units  0-15 Units Subcutaneous TID WC Loletha Grayer, MD   3 Units at 10/14/16 1707  . insulin aspart (novoLOG) injection 0-5 Units  0-5 Units Subcutaneous QHS Gladstone Lighter, MD  3 Units at 10/13/16 2147  . insulin glargine (LANTUS) injection 40 Units  40 Units Subcutaneous QHS Henreitta Leber, MD   40 Units at 10/13/16 2148  . iron polysaccharides (NIFEREX) capsule 150 mg  150 mg Oral TID Gladstone Lighter, MD   150 mg at 10/14/16 1706  . loratadine (CLARITIN) tablet 10 mg  10 mg Oral Daily Gladstone Lighter, MD   10 mg at 10/14/16 0950  . metoCLOPramide (REGLAN) tablet 5  mg  5 mg Oral TID AC Theodoro Grist, MD   5 mg at 10/14/16 1706  . metoprolol succinate (TOPROL-XL) 24 hr tablet 50 mg  50 mg Oral BID Theodoro Grist, MD   50 mg at 10/14/16 0954  . nystatin (MYCOSTATIN/NYSTOP) topical powder   Topical TID Gladstone Lighter, MD      . ondansetron [facility]) tablet 4 mg  4 mg Oral Q6H PRN Gladstone Lighter, MD   4 mg at 09/29/16 1109   Or  . ondansetron (ZOFRAN) injection 4 mg  4 mg Intravenous Q6H PRN Gladstone Lighter, MD   4 mg at 09/27/16 0950  . pantoprazole (PROTONIX) EC tablet 40 mg  40 mg Oral BID Gladstone Lighter, MD   40 mg at 10/14/16 0950  . polyvinyl alcohol (LIQUIFILM TEARS) 1.4 % ophthalmic solution 1 drop  1 drop Both Eyes PRN Theodoro Grist, MD      . potassium chloride SA (K-DUR,KLOR-CON) CR tablet 20 mEq  20 mEq Oral Daily Gladstone Lighter, MD   20 mEq at 10/14/16 0950  . senna (SENOKOT) tablet 8.6 mg  1 tablet Oral Daily Napoleon Form, RPH   8.6 mg at 10/14/16 1607  . traZODone (DESYREL) tablet 50 mg  50 mg Oral QHS Loletha Grayer, MD   50 mg at 10/13/16 2150  . vitamin E capsule 1,000 Units  1,000 Units Oral Daily Gladstone Lighter, MD   1,000 Units at 10/12/16 1115    Musculoskeletal: Strength & Muscle Tone: decreased Gait & Station: unable to stand Patient leans: N/A  Psychiatric Specialty Exam: Physical Exam  Constitutional: She appears well-developed.  HENT:  Head: Normocephalic and atraumatic.  Eyes: Conjunctivae are normal. Pupils are equal, round, and reactive to light.  Neck: Normal range of motion.  Cardiovascular: Normal heart sounds.   Respiratory: Effort normal.  GI: Soft.  Musculoskeletal: Normal range of motion.  Neurological: She is alert.  Skin: Skin is warm and dry.  Psychiatric: Her affect is inappropriate. Her speech is delayed. She is withdrawn. Cognition and memory are impaired. She is noncommunicative.    Review of Systems  Unable to perform ROS: Psychiatric disorder    Blood pressure (!) 142/74, pulse  77, temperature 99.6 F (37.6 C), temperature source Oral, resp. rate 18, height 5' 8"  (1.727 m), weight 114.7 kg (252 lb 12.8 oz), SpO2 98 %.Body mass index is 38.44 kg/m.  General Appearance: Fairly Groomed  Eye Contact:  Minimal  Speech:  Negative  Volume:  Decreased  Mood:  Negative  Affect:  Negative  Thought Process:  NA  Orientation:  Negative  Thought Content:  Negative  Suicidal Thoughts:  Unknown  Homicidal Thoughts:  Unknown  Memory:  Negative  Judgement:  Negative  Insight:  Negative  Psychomotor Activity:  Negative  Concentration:  Concentration: Negative  Recall:  Negative  Fund of Knowledge:  Negative  Language:  Negative  Akathisia:  Negative  Handed:  Right  AIMS (if indicated):     Assets:  Social Support  ADL's:  Impaired  Cognition:  Impaired,  Severe  Sleep:        Treatment Plan Summary: Daily contact with patient to assess and evaluate symptoms and progress in treatment, Medication management and Plan 75 year old woman. Very withdrawn. Not eating much of anything. Not cooperating with therapy. Intermittently verbal but to me this evening nonverbal again. As discussed previously I believe the most likely explanation for her current presentation is severe major depression. Treatment with medication is appropriate. Treatment with ECT for this would probably be a better option. Meanwhile the patient is not able to cooperate with any therapy or to feed herself. I also understand that she is not receiving specific medical treatment. A proposal has been floated to try to discharge her home with 24-hour a day care. Dr. Leslye Peer and I discussed that today. It sounded like it was still uncertain whether this would be available. Also unclear if this would really meet the patient's needs or would be a safe disposition. Last time I spoke with him he was still undecided about how he felt about this plan. The idea has also been proposed that the patient be hospice care. I am  not aware that palliative care has changed their stance on that. My take on it would still be that until we have exhausted all the possible treatments for severe depression that the patient really is not clearly suffering from a terminal illness however I would not get into a disagreement with palliative care and I understand that it would certainly be important to provide as much comfort as possible. At this point I am continuing treatment with antidepressants and antipsychotics. The patient is no longer under involuntary commitment. If she is still in the hospital one way or another after the weekend I will be anticipating asking her sister to sign consent for ECT. I very much regrets that we are not able to take care of her on the behavioral health unit and I am very sorry that there has not been a geriatric psychiatry bed available for her. I will simply continue to follow-up as needed and provide what help I can.  Disposition: Recommend psychiatric Inpatient admission when medically cleared. Supportive therapy provided about ongoing stressors.  Alethia Berthold, MD 10/14/2016 7:32 PM

## 2016-10-14 NOTE — Plan of Care (Signed)
Patient is a total care and needs to be fed.  Continues to have 4-5 bites and then refuses to eat anymore.  Also, patient continues to chew on food excessively - requiring about 30 minutes to achieve 4-5 bites. If prior weight is accurate - it appears that patient has lost about 40 lbs in 2 weeks. (290lbs to 152.8lbs(today)

## 2016-10-14 NOTE — Care Management (Signed)
Per sisters requests I have provided her with a PCS agency list

## 2016-10-14 NOTE — Progress Notes (Signed)
Daily Progress Note   Patient Name: Carolyn Valdez       Date: 10/14/2016 DOB: [birth year]  Age: 75 y.o. MRN#: [id number] Attending Physician: Loletha Grayer, MD Primary Care Physician: Leonel Ramsay, MD Admit Date: 09/24/2016  Reason for Consultation/Follow-up: Establishing goals of care and Psychosocial/spiritual support  Subjective:  - today I met at the patient's bedside with  Sister/ Horace Porteous and friend and neighbor of patient/ Brantley Fling was able to reinforce that the patient was living independently 2-3 months ago and showed no signs of dementia  at that time  -sister remains hopeful that patient will be eligible for any treatment (ECT) in hopes of returning her to baseline  -there apparently is some complication that the sister does not have keys to the patient's home, does not have access to her financial information and is not a documented POA.  The neighbor has keys but reuses to let sister into the home 2/2 to a promise she made to Ms Folson.    -at this time patient does not have capacity, the sister is frustrated because medical team is looking to her for assistance with disposition plan yet she has no access to the patient's finances to tap into for payment for out of pocket caregivers  -sister verbalizes that she cannot financially or physically take care of her sister. [name] is the next of kin.    Sister has her own health issues  -Encouraged sister to consider securing guardianship for her sister perhaps enabling her to utilize patient's own finances for anticipatory care needs  Length of Stay: 20  Current Medications: Scheduled Meds:  . amLODipine  10 mg Oral Daily  . ARIPiprazole  7.5 mg Oral Daily  . citalopram  30 mg Oral Daily  .  clonazePAM  0.25 mg Oral BID  . conjugated estrogens  1 Applicatorful Vaginal Once per day on Mon Wed Fri  . darifenacin  15 mg Oral Daily  . desmopressin  0.2 mg Oral QHS  . docusate sodium  100 mg Oral BID  . enoxaparin (LOVENOX) injection  40 mg Subcutaneous Q12H  . feeding supplement (ENSURE ENLIVE)  237 mL Oral TID BM  . insulin aspart  0-15 Units Subcutaneous TID WC  . insulin aspart  0-5 Units Subcutaneous QHS  . insulin glargine  40 Units Subcutaneous QHS  . iron polysaccharides  150 mg Oral TID  . loratadine  10 mg Oral Daily  . metoCLOPramide  5 mg Oral TID AC  . metoprolol succinate  50 mg Oral BID  . nystatin   Topical TID  . pantoprazole  40 mg Oral BID  . potassium chloride SA  20 mEq Oral Daily  . senna  1 tablet Oral Daily  . traZODone  50 mg Oral QHS  . vitamin E  1,000 Units Oral Daily    Continuous Infusions:   PRN Meds: acetaminophen **OR** acetaminophen, alum & mag hydroxide-simeth, ondansetron **OR** ondansetron (ZOFRAN) IV, polyvinyl alcohol  Physical Exam  Constitutional: She appears well-developed. She appears ill.  HENT:  Mouth/Throat: Oropharynx is clear and moist.  Cardiovascular: Normal rate, regular rhythm and normal heart sounds.   Pulmonary/Chest: She has decreased breath sounds in the right lower field and the left lower field.  Skin: Skin is warm and dry. There is pallor.  Psychiatric: Her affect is blunt. Her speech is delayed. She is slowed. Cognition and memory are impaired.            Vital Signs: BP (!) 142/74   Pulse 77   Temp 99.6 F (37.6 C) (Oral)   Resp 18   Ht '5\' 8"'$  (1.727 m)   Wt 131.5 kg (290 lb)   SpO2 98%   BMI 44.09 kg/m  SpO2: SpO2: 98 % O2 Device: O2 Device: Not Delivered O2 Flow Rate: O2 Flow Rate (L/min): 0 L/min  Intake/output summary:  Intake/Output Summary (Last 24 hours) at 10/14/16 1330 Last data filed at 10/13/16 2223  Gross per 24 hour  Intake                0 ml  Output                0 ml  Net                 0 ml   LBM: Last BM Date: 10/12/16 Baseline Weight: Weight: 131.5 kg (290 lb) Most recent weight: Weight: 131.5 kg (290 lb)       Palliative Assessment/Data:  30 % at best    Flowsheet Rows   Flowsheet Row Most Recent Value  Intake Tab  Referral Department  Hospitalist  Unit at Time of Referral  Med/Surg Unit  Palliative Care Primary Diagnosis  Other (Comment)  Date Notified  10/04/16  Palliative Care Type  New Palliative care  Reason for referral  Clarify Goals of Care  Date of Admission  09/24/16  Date first seen by Palliative Care  10/05/16  # of days Palliative referral response time  1 Day(s)  # of days IP prior to Palliative referral  10  Clinical Assessment  Psychosocial & Spiritual Assessment  Palliative Care Outcomes      Patient Active Problem List   Diagnosis Date Noted  . DNR (do not resuscitate) 10/05/2016  . Palliative care by specialist 10/05/2016  . Depression, major, recurrent, severe with psychosis ([place]) 10/05/2016  . Severe major depression, single episode, with psychotic features ([place]) 10/04/2016  . Cellulitis of leg, right 09/29/2016  . Proctitis 09/29/2016  . Hypercapnia 09/29/2016  . Acute urinary retention 09/29/2016  . UTI (urinary tract infection) 09/29/2016  . Generalized weakness 09/29/2016  . Subacute delirium 09/28/2016  . Gastrointestinal hemorrhage   . Diffuse abdominal pain   . Altered mental status 09/24/2016  . Pressure injury of skin 09/02/2016  .  Respiratory failure with hypoxia ([place]) 09/01/2016  . Lymphedema 08/16/2016  . CHF (congestive heart failure) ([place]) 08/09/2016  . Congestive heart failure ([place]) 11/25/2015  . Nocturia 11/05/2015  . Urinary frequency 11/05/2015  . Acute respiratory failure with hypoxia ([place]) 09/05/2015  . 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 Incontinence 04/20/2015  . Diabetes mellitus, type 2 ([place]) 04/17/2015  . ESBL (extended spectrum beta-lactamase) producing bacteria infection 04/17/2015  . BP  (high blood pressure) 04/17/2015  . Frequent UTI 04/17/2015  . Absence of bladder continence 04/17/2015  . Iron deficiency anemia 03/08/2015  . Absolute anemia 09/25/2014  . Abdominal pain, lower 09/25/2014  . Urge incontinence 02/19/2013  . FOM (frequency of micturition) 02/19/2013  . Bladder infection, chronic 08/11/2012  . Difficult or painful urination 07/26/2012  . Lower urinary tract infection 12/31/2011  . Diabetes mellitus ([place]) 12/31/2011    Palliative Care Assessment & Plan   Patient Profile: 75 y.o. female  with a known history of multiple medical problems including diastolic congestive heart failure, arthritis, bilateral lower extremity edema from venous insufficiency, multiple admissions for recurrent UTIs, hypertension, diabetes mellitus brought in from home after a recent discharge secondary to altered mental status.  Patient was recently discharged  from the hospital after being treated for MRSA UTI with oral doxycycline. Her sister tells me less than 2 months ago this patient was living alone independently and still driving.  She had a short stay at a rehab with little success and then for a short period of time she was at home with in home caregivers.  She has had a profound change in her mental state.  Her eyes are open but she does not communicate, occasionally she speaks a few words, which are tangential.  She occasionally smiles and laughs inappropriately.    So far none of the labs are work up shows anything that would account for this kind of profound change. MRI unremarkable. All the labs  unremarkable.  Psychiatry is consultedand treating patient for severe depression, currently adjusting anti-psychotics and considering  ECT for early next week.  There is no documented HPOA, she has no children and her next of kin is her only sibling her sister [name].  Assessment: - severe depression/psycosis  -no HPOA -disposition considerations    Code  Status:    Code Status Orders        Start     Ordered   10/05/16 1217  Do not attempt resuscitation (DNR)  Continuous    Question Answer Comment  In the event of cardiac or respiratory ARREST Do not call a "code blue"   In the event of cardiac or respiratory ARREST Do not perform Intubation, CPR, defibrillation or ACLS   In the event of cardiac or respiratory ARREST Use medication by any route, position, wound care, and other measures to relive pain and suffering. May use oxygen, suction and manual treatment of airway obstruction as needed for comfort.      10/05/16 1216    Code Status History    Date Active Date Inactive Code Status Order ID Comments User Context   09/24/2016  5:31 PM 10/05/2016 12:16 PM Full Code [id number]  Gladstone Lighter, MD Inpatient   09/01/2016  2:17 AM 09/05/2016  4:28 PM Full Code [id number]  Harvie Bridge, DO Inpatient   08/09/2016  8:27 PM 08/12/2016  8:15 PM Full Code [id number]  Henreitta Leber, MD Inpatient   09/30/2015  2:23 PM 10/07/2015  5:37 PM Full Code [id number]  Loletha Grayer,  MD ED   09/05/2015  5:56 PM 09/09/2015  5:38 PM DNR [id number]  Loletha Grayer, MD ED    Advance Directive Documentation   [place] Most Recent Value  Type of Advance Directive  Living will  Pre-existing out of facility DNR order (yellow form or pink MOST form)  No data  "MOST" Form in Place?  No data       Prognosis:   Unable to determine  Discharge Planning:  To Be Determined  Care plan was discussed with Sister  Thank you for allowing the Palliative Medicine Team to assist in the care of this patient.   Time In: 1100 Time Out: 1135 Total Time 35 min Prolonged Time Billed  no       Greater than 50%  of this time was spent counseling and coordinating care related to the above assessment and plan.  Wadie Lessen, NP  Please contact Palliative Medicine Team phone at [phone] for questions and concerns.

## 2016-10-14 NOTE — Progress Notes (Signed)
PT Cancellation Note  Patient Details Name: Carolyn Valdez MRN: [id number] DOB: Jul 10, 1941   Cancelled Treatment:    Reason Eval/Treat Not Completed: Other (comment). Treatment attempted for 10 minutes; pt lethargic with flat affect and minimally responsive. Pt is only able to demonstrate moderate movement actively for ankle pumps. Pt denies pain initially; however, reports pain with passive knee extension in short range. Pt attempts several times to say something, but never finishes a thought beyond a few words closing eyes most of the time. Pt unable to assist or demonstrate any other movement or isometric exercises. No further treatment attempted at this time. Continue attempt to treat; however, may not be appropriate to continue on caseload without active participation or responsiveness to treatment.    Larae Grooms, PTA 10/14/2016, 3:39 PM

## 2016-10-15 LAB — GLUCOSE, CAPILLARY
Glucose-Capillary: 247 mg/dL — ABNORMAL HIGH (ref 65–99)
Glucose-Capillary: 315 mg/dL — ABNORMAL HIGH (ref 65–99)
Glucose-Capillary: 272 mg/dL — ABNORMAL HIGH (ref 65–99)
Glucose-Capillary: 228 mg/dL — ABNORMAL HIGH (ref 65–99)

## 2016-10-15 MED ORDER — INSULIN GLARGINE 100 UNIT/ML ~~LOC~~ SOLN
45.0000 [IU] | Freq: Every day | SUBCUTANEOUS | Status: DC
  Administered 2016-10-15 – 2016-10-16 (×2): 45 [IU] via SUBCUTANEOUS
  Filled 2016-10-15 (×3): qty 0.45

## 2016-10-15 NOTE — Care Management Important Message (Signed)
Important Message  Patient Details  Name: Carolyn Valdez MRN: [id number] Date of Birth: 09-Apr-1941   Medicare Important Message Given:  Yes    Beverly Sessions, RN 10/15/2016, 1:50 PM

## 2016-10-15 NOTE — Progress Notes (Signed)
Patient ID: Carolyn Valdez, female   DOB: Feb 28, 1941, 75 y.o.   MRN: [id number]   [facility] PROGRESS NOTE  Carolyn Valdez YCX:[id number] DOB: [birth year] DOA: 09/24/2016 PCP: Leonel Ramsay, MD  HPI/Subjective: Patient more talkative today. Today the blinds were open and she was seeing a large white building out there and people jumping in it which is not there. Patient's feels okay. She was talking about a time at home where she was being cleaned up by a man and she states that it should've been that way. This is the most talkative i've seen this patient the entire time I've seen her.  Objective: Vitals:   10/15/16 0644 10/15/16 1237  BP: (!) 159/62 (!) 160/64  Pulse: 76 79  Resp:  18  Temp: 98.8 F (37.1 C) 98.4 F (36.9 C)    Filed Weights   09/24/16 1159 10/14/16 1432  Weight: 131.5 kg (290 lb) 114.7 kg (252 lb 12.8 oz)    ROS: Review of Systems  Unable to perform ROS: Acuity of condition  Respiratory: Negative for shortness of breath.   Cardiovascular: Negative for chest pain.  Gastrointestinal: Negative for abdominal pain.   Exam: Physical Exam  HENT:  Nose: No mucosal edema.  Mouth/Throat: No oropharyngeal exudate or posterior oropharyngeal edema.  Eyes: Conjunctivae, EOM and lids are normal. Pupils are equal, round, and reactive to light.  Neck: No JVD present. Carotid bruit is not present. No edema present. No thyroid mass and no thyromegaly present.  Cardiovascular: S1 normal and S2 normal.  Exam reveals no gallop.   Murmur heard.  Systolic murmur is present with a grade of 2/6  Pulses:      Dorsalis pedis pulses are 2+ on the right side, and 2+ on the left side.  Respiratory: No respiratory distress. She has decreased breath sounds in the right lower field and the left lower field. She has no wheezes. She has no rhonchi. She has no rales.  GI: Soft. Bowel sounds are normal. There is no tenderness.  Musculoskeletal:       Right ankle: She exhibits swelling.        Left ankle: She exhibits swelling.  Lymphadenopathy:    She has no cervical adenopathy.  Neurological: She is alert. No cranial nerve deficit.  Skin: Skin is warm. No rash noted. Nails show no clubbing.  Psychiatric: Her affect is blunt. She is slowed.      Data Reviewed: Basic Metabolic Panel:  Recent Labs Lab 10/11/16 0435 10/13/16 0454  NA  --  134*  K  --  4.4  CL  --  94*  CO2  --  32  GLUCOSE  --  192*  BUN  --  31*  CREATININE 1.12* 1.09*  CALCIUM  --  10.1  MG  --  1.9   BNP (last 3 results)  Recent Labs  02/23/16 1617 08/09/16 1550 08/31/16 2145  BNP 181.0* 311.0* 163.0*     CBG:  Recent Labs Lab 10/14/16 1146 10/14/16 1636 10/14/16 2221 10/15/16 0724 10/15/16 1143  GLUCAP 226* 162* 170* 247* 315*     Scheduled Meds: . amLODipine  10 mg Oral Daily  . ARIPiprazole  7.5 mg Oral Daily  . citalopram  30 mg Oral Daily  . clonazePAM  0.25 mg Oral BID  . conjugated estrogens  1 Applicatorful Vaginal Once per day on Mon Wed Fri  . darifenacin  15 mg Oral Daily  . desmopressin  0.2 mg Oral QHS  .  docusate sodium  100 mg Oral BID  . enoxaparin (LOVENOX) injection  40 mg Subcutaneous Q12H  . feeding supplement (ENSURE ENLIVE)  237 mL Oral TID BM  . insulin aspart  0-15 Units Subcutaneous TID WC  . insulin aspart  0-5 Units Subcutaneous QHS  . insulin glargine  40 Units Subcutaneous QHS  . iron polysaccharides  150 mg Oral TID  . loratadine  10 mg Oral Daily  . metoCLOPramide  5 mg Oral TID AC  . metoprolol succinate  50 mg Oral BID  . nystatin   Topical TID  . pantoprazole  40 mg Oral BID  . potassium chloride SA  20 mEq Oral Daily  . senna  1 tablet Oral Daily  . traZODone  50 mg Oral QHS  . vitamin E  1,000 Units Oral Daily    Assessment/Plan:  1. 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 Case discussed with Dr. Carlena Hurl psychiatry yesterday and he will start ECT on Wednesday.  Patient on Abilify, Celexa, trazodone and  Klonopin. Patient with delirium on what she is able to talk with me about today. 2. Type 2 diabetes-  increase Lantus to 45 units and sliding scale. Would rather have sugars on the higher side than too low. 3. Hyperlipidemia unspecified. Atorvastatin stopped 4. Essential hypertension on Norvasc and Toprol 5. GERD on Protonix 6. History of diabetic gastroparesis on Reglan 7. History of diastolic congestive heart failure and lower extremity edema. Hold Lasix with creatinine starting to creep up. Patient must eat better. 8. Weakness. Unable to do much with physical therapy 9. Failure to thrive. Patient must eat better in order to survive   Code Status:     Code Status Orders        Start     Ordered   10/05/16 1217  Do not attempt resuscitation (DNR)  Continuous    Question Answer Comment  In the event of cardiac or respiratory ARREST Do not call a "code blue"   In the event of cardiac or respiratory ARREST Do not perform Intubation, CPR, defibrillation or ACLS   In the event of cardiac or respiratory ARREST Use medication by any route, position, wound care, and other measures to relive pain and suffering. May use oxygen, suction and manual treatment of airway obstruction as needed for comfort.      10/05/16 1216    Code Status History    Date Active Date Inactive Code Status Order ID Comments User Context   09/24/2016  5:31 PM 10/05/2016 12:16 PM Full Code [id number]  Gladstone Lighter, MD Inpatient   09/01/2016  2:17 AM 09/05/2016  4:28 PM Full Code [id number]  Harvie Bridge, DO Inpatient   08/09/2016  8:27 PM 08/12/2016  8:15 PM Full Code [id number]  Henreitta Leber, MD Inpatient   09/30/2015  2:23 PM 10/07/2015  5:37 PM Full Code [id number]  Loletha Grayer, MD ED   09/05/2015  5:56 PM 09/09/2015  5:38 PM DNR [id number]  Loletha Grayer, MD ED    Advance Directive Documentation   [place] Most Recent Value  Type of Advance Directive  Living will  Pre-existing out of facility  DNR order (yellow form or pink MOST form)  No data  "MOST" Form in Place?  No data     Disposition Plan: Family will have to set up 24 7 care at home and they will need someone to give insulin injections. Sister states that she is working on things. Spoke with care Freight forwarder and they  state the family has to arrange this. Unable to safely send the patient home until things are set up where the patient can receive insulin injections.   Consultants:  Psychiatry  Time spent: 28 minutes  [place], [place]

## 2016-10-15 NOTE — Care Management (Signed)
Followed up with patient sister.  She states that she has called 4 different agencies in attempt to arrange a different company to provide in home care at discharge.   I have informed sister that it will be patient and familles responsibly to arrange for someone to be available to administer insulin in the home.    Sister states that "it's not an option for her to come and stay with me when its time to go home", " and "she has told me that she is not going to use her money any more for sitter at the house".  Requested psych to assess for competency

## 2016-10-15 NOTE — Progress Notes (Signed)
Nutrition Follow-up  DOCUMENTATION CODES:   Morbid obesity  INTERVENTION:  -Discussed nutrition poc and calorie count with MD [place]; pt po intake is improved overall since admission although not meeting 100% of needs. No plan for nutrition support at this time. Pt eats best with assistance, takes Magic Cup and Ensure supplements as well at times. Received verbal order from MD [place] to discontinue calorie count at this time. If nutrition support is desired, recommend insertion of dobhoff tube with initiation of EN. Otherwise, continue to encourage po intake -Continue feeding assistance, encouragement, redirection as needed at meal times -Continue Magic Cup, [place] -Pt may benefit from appetite stimulant such as remeron  NUTRITION DIAGNOSIS:   Inadequate oral intake related to acute illness (AMS) as evidenced by meal completion < 25%.  Improving  GOAL:   Patient will meet greater than or equal to 90% of their needs  MONITOR:   PO intake, Supplement acceptance, Labs, Weight trends  REASON FOR ASSESSMENT:   LOS    ASSESSMENT:    Pt alert, talking to writer on visit today. Pt reports she likes "chicken" but then reports she does not like "chicken that acts like fish." Pt then goes on to say that the only fish she likes is "[place] flounder." Pt has reported to some of the nsg staff that she does not like some of the food here.  Noted plan for ECT starting next week  Calorie count order continues.  Nothing recorded in calorie count folder except for Breakfast on 12/28 (previous days already documented in chat). Yesterday for breakfast, pt ate 1/2 fruit cup, 1/2 strip of bacon and 1/2 biscuit. Per chart review, pt ate 10% at lunch, nothing recorded at dinner time.   Pt ate good breakfast this AM: all of the OJ, most of the Magic Cup, most of the sausage biscuit, 1/2 of the grits and milk and some of the fruit cup.  On visit, Zaneta RN administering meds to pt in [employer]  and pt eating well. Pt also drinking some Ensure at times as well.    Labs: FSBS 100s-300s, Creatinine 1.09 (12/27) Meds: reglan, lantus, ss novolog, potassium chloride  Diet Order:  Diet - low sodium heart healthy Diet regular Room service appropriate? Yes; Fluid consistency: Thin  Skin:   (no pressure ulcer (noted stage II pressure ulcer documented 1 month ago))  Last BM:  12/20  Height:   Ht Readings from Last 1 Encounters:  09/24/16 5\' 8"  (1.727 m)    Weight:   Wt Readings from Last 1 Encounters:  10/14/16 252 lb 12.8 oz (114.7 kg)    Filed Weights   09/24/16 1159 10/14/16 1432  Weight: 290 lb (131.5 kg) 252 lb 12.8 oz (114.7 kg)    BMI:  Body mass index is 38.44 kg/m.  Estimated Nutritional Needs:   Kcal:  1600-2000 calories  Protein:  100-140 g  Fluid:  >/= 1.6 L  EDUCATION NEEDS:   No education needs identified at this time  [place], [place], [place] [phone] Pager  [phone] Weekend/On-Call Pager

## 2016-10-16 LAB — CREATININE, SERUM: CREATININE: 0.78 mg/dL (ref 0.44–1.00)

## 2016-10-16 LAB — GLUCOSE, CAPILLARY
GLUCOSE-CAPILLARY: 218 mg/dL — AB (ref 65–99)
Glucose-Capillary: 247 mg/dL — ABNORMAL HIGH (ref 65–99)
Glucose-Capillary: 194 mg/dL — ABNORMAL HIGH (ref 65–99)
Glucose-Capillary: 205 mg/dL — ABNORMAL HIGH (ref 65–99)

## 2016-10-16 MED ORDER — POLYETHYLENE GLYCOL 3350 17 G PO PACK
17.0000 g | PACK | Freq: Every day | ORAL | Status: DC
  Administered 2016-10-16 – 2016-10-26 (×8): 17 g via ORAL
  Filled 2016-10-16 (×8): qty 1

## 2016-10-16 NOTE — Progress Notes (Signed)
Patient ID: Carolyn Valdez, female   DOB: 1941/03/03, 75 y.o.   MRN: [id number]   [facility] PROGRESS NOTE  [name]:[id number] DOB: 1940/12/24 DOA: 09/24/2016 PCP: Leonel Ramsay, MD  HPI/Subjective: Patient more communicative but still little confused    objective  Vitals:   10/16/16 0610 10/16/16 1226  BP: (!) 157/76 (!) 156/60  Pulse: 76 69  Resp: 20 18  Temp: 98.4 F (36.9 C) 98.3 F (36.8 C)    Filed Weights   09/24/16 1159 10/14/16 1432  Weight: 290 lb (131.5 kg) 252 lb 12.8 oz (114.7 kg)    ROS: Review of Systems  Unable to perform ROS: Acuity of condition  Respiratory: Negative for shortness of breath.   Cardiovascular: Negative for chest pain.  Gastrointestinal: Negative for abdominal pain.   Exam: Physical Exam  HENT:  Nose: No mucosal edema.  Mouth/Throat: No oropharyngeal exudate or posterior oropharyngeal edema.  Eyes: Conjunctivae, EOM and lids are normal. Pupils are equal, round, and reactive to light.  Neck: No JVD present. Carotid bruit is not present. No edema present. No thyroid mass and no thyromegaly present.  Cardiovascular: S1 normal and S2 normal.  Exam reveals no gallop.   Murmur heard.  Systolic murmur is present with a grade of 2/6  Pulses:      Dorsalis pedis pulses are 2+ on the right side, and 2+ on the left side.  Respiratory: No respiratory distress. She has no decreased breath sounds. She has no wheezes. She has no rhonchi. She has no rales.  GI: Soft. Bowel sounds are normal. There is no tenderness.  Musculoskeletal:       Right ankle: She exhibits swelling.       Left ankle: She exhibits swelling.  Lymphadenopathy:    She has no cervical adenopathy.  Neurological: She is alert. No cranial nerve deficit.  Skin: Skin is warm. No rash noted. Nails show no clubbing.  Psychiatric: Her affect is blunt. She is slowed.      Data Reviewed: Basic Metabolic Panel:  Recent Labs Lab 10/11/16 0435 10/13/16 0454  10/16/16 0520  NA  --  134*  --   K  --  4.4  --   CL  --  94*  --   CO2  --  32  --   GLUCOSE  --  192*  --   BUN  --  31*  --   CREATININE 1.12* 1.09* 0.78  CALCIUM  --  10.1  --   MG  --  1.9  --    BNP (last 3 results)  Recent Labs  02/23/16 1617 08/09/16 1550 08/31/16 2145  BNP 181.0* 311.0* 163.0*     CBG:  Recent Labs Lab 10/15/16 1143 10/15/16 1634 10/15/16 2053 10/16/16 0722 10/16/16 1137  GLUCAP 315* 272* 228* 218* 247*     Scheduled Meds: . amLODipine  10 mg Oral Daily  . ARIPiprazole  7.5 mg Oral Daily  . citalopram  30 mg Oral Daily  . clonazePAM  0.25 mg Oral BID  . conjugated estrogens  1 Applicatorful Vaginal Once per day on Mon Wed Fri  . darifenacin  15 mg Oral Daily  . desmopressin  0.2 mg Oral QHS  . docusate sodium  100 mg Oral BID  . enoxaparin (LOVENOX) injection  40 mg Subcutaneous Q12H  . feeding supplement (ENSURE ENLIVE)  237 mL Oral TID BM  . insulin aspart  0-15 Units Subcutaneous TID WC  . insulin aspart  0-5 Units Subcutaneous QHS  . insulin glargine  45 Units Subcutaneous QHS  . iron polysaccharides  150 mg Oral TID  . loratadine  10 mg Oral Daily  . metoCLOPramide  5 mg Oral TID AC  . metoprolol succinate  50 mg Oral BID  . nystatin   Topical TID  . pantoprazole  40 mg Oral BID  . polyethylene glycol  17 g Oral Daily  . potassium chloride SA  20 mEq Oral Daily  . senna  1 tablet Oral Daily  . traZODone  50 mg Oral QHS  . vitamin E  1,000 Units Oral Daily    Assessment/Plan:  1. 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 Patient continues to improve continue supportive care 2. Type 2 diabetes-  increase Lantus to 45 units and sliding scale. Continue current therapy  3. Hyperlipidemia unspecified. Atorvain stopped 4. Essential hypertension on Norvasc and Toprol 5. GERD on Protonix 6. History of diabetic gastroparesis on Reglan 7. History of diastolic congestive heart failure and lower extremity edema. Hold  Lasix with creatinine starting to creep up. Patient must eat better. 8. Weakness. Unable to do much with physical therapy 9. Failure to thrive. Patient must eat better in order to survive   Code Status:     Code Status Orders        Start     Ordered   10/05/16 1217  Do not attempt resuscitation (DNR)  Continuous    Question Answer Comment  In the event of cardiac or respiratory ARREST Do not call a "code blue"   In the event of cardiac or respiratory ARREST Do not perform Intubation, CPR, defibrillation or ACLS   In the event of cardiac or respiratory ARREST Use medication by any route, position, wound care, and other measures to relive pain and suffering. May use oxygen, suction and manual treatment of airway obstruction as needed for comfort.      10/05/16 1216    Code Status History    Date Active Date Inactive Code Status Order ID Comments User Context   09/24/2016  5:31 PM 10/05/2016 12:16 PM Full Code [id number]  Gladstone Lighter, MD Inpatient   09/01/2016  2:17 AM 09/05/2016  4:28 PM Full Code [id number]  Harvie Bridge, DO Inpatient   08/09/2016  8:27 PM 08/12/2016  8:15 PM Full Code [id number]  Henreitta Leber, MD Inpatient   09/30/2015  2:23 PM 10/07/2015  5:37 PM Full Code [id number]  Loletha Grayer, MD ED   09/05/2015  5:56 PM 09/09/2015  5:38 PM DNR [id number]  Loletha Grayer, MD ED    Advance Directive Documentation   [place] Most Recent Value  Type of Advance Directive  Living will  Pre-existing out of facility DNR order (yellow form or pink MOST form)  No data  "MOST" Form in Place?  No data     Disposition Plan: Family will have to set up 24 7 care at home and they will need someone to give insulin injections. Sister states that she is working on things. Spoke with care manager and they state the family has to arrange this. Unable to safely send the patient home until things are set up where the patient can receive insulin injections.    Consultants:  Psychiatry  Time spent: 28 minutes  [place], [facility]

## 2016-10-17 LAB — GLUCOSE, CAPILLARY
GLUCOSE-CAPILLARY: 215 mg/dL — AB (ref 65–99)
GLUCOSE-CAPILLARY: 196 mg/dL — AB (ref 65–99)
GLUCOSE-CAPILLARY: 218 mg/dL — AB (ref 65–99)
Glucose-Capillary: 324 mg/dL — ABNORMAL HIGH (ref 65–99)

## 2016-10-17 MED ORDER — INSULIN GLARGINE 100 UNIT/ML ~~LOC~~ SOLN
48.0000 [IU] | Freq: Every day | SUBCUTANEOUS | Status: DC
  Administered 2016-10-17 – 2016-10-18 (×2): 48 [IU] via SUBCUTANEOUS
  Filled 2016-10-17 (×3): qty 0.48

## 2016-10-17 NOTE — Progress Notes (Signed)
Patient ID: Carolyn Valdez, female   DOB: 01-29-1941, 75 y.o.   MRN: [id number]   [facility] PROGRESS NOTE  Carolyn Valdez WLN:[id number] DOB: 1941/01/13 DOA: 09/24/2016 PCP: Leonel Ramsay, MD  HPI/Subjective: Confused but able to answer questions    objective  Vitals:   10/17/16 1242 10/17/16 1244  BP: (!) 156/104 (!) 143/42  Pulse:  69  Resp:    Temp:      Filed Weights   09/24/16 1159 10/14/16 1432  Weight: 290 lb (131.5 kg) 252 lb 12.8 oz (114.7 kg)    ROS: Review of Systems  Unable to perform ROS: Acuity of condition  Respiratory: Negative for shortness of breath.   Cardiovascular: Negative for chest pain.  Gastrointestinal: Negative for abdominal pain.   Exam: Physical Exam  HENT:  Nose: No mucosal edema.  Mouth/Throat: No oropharyngeal exudate or posterior oropharyngeal edema.  Eyes: Conjunctivae, EOM and lids are normal. Pupils are equal, round, and reactive to light.  Neck: No JVD present. Carotid bruit is not present. No edema present. No thyroid mass and no thyromegaly present.  Cardiovascular: S1 normal and S2 normal.  Exam reveals no gallop.   Murmur heard.  Systolic murmur is present with a grade of 2/6  Pulses:      Dorsalis pedis pulses are 2+ on the right side, and 2+ on the left side.  Respiratory: No respiratory distress. She has no decreased breath sounds. She has no wheezes. She has no rhonchi. She has no rales.  GI: Soft. Bowel sounds are normal. There is no tenderness.  Musculoskeletal:       Right ankle: She exhibits swelling.       Left ankle: She exhibits swelling.  Lymphadenopathy:    She has no cervical adenopathy.  Neurological: She is alert. No cranial nerve deficit.  Skin: Skin is warm. No rash noted. Nails show no clubbing.  Psychiatric: Her affect is blunt. She is slowed.      Data Reviewed: Basic Metabolic Panel:  Recent Labs Lab 10/11/16 0435 10/13/16 0454 10/16/16 0520  NA  --  134*  --   K  --  4.4  --   CL   --  94*  --   CO2  --  32  --   GLUCOSE  --  192*  --   BUN  --  31*  --   CREATININE 1.12* 1.09* 0.78  CALCIUM  --  10.1  --   MG  --  1.9  --    BNP (last 3 results)  Recent Labs  02/23/16 1617 08/09/16 1550 08/31/16 2145  BNP 181.0* 311.0* 163.0*     CBG:  Recent Labs Lab 10/16/16 1137 10/16/16 1638 10/16/16 2104 10/17/16 0729 10/17/16 1148  GLUCAP 247* 194* 205* 215* 324*     Scheduled Meds: . amLODipine  10 mg Oral Daily  . ARIPiprazole  7.5 mg Oral Daily  . citalopram  30 mg Oral Daily  . clonazePAM  0.25 mg Oral BID  . conjugated estrogens  1 Applicatorful Vaginal Once per day on Mon Wed Fri  . darifenacin  15 mg Oral Daily  . desmopressin  0.2 mg Oral QHS  . docusate sodium  100 mg Oral BID  . enoxaparin (LOVENOX) injection  40 mg Subcutaneous Q12H  . feeding supplement (ENSURE ENLIVE)  237 mL Oral TID BM  . insulin aspart  0-15 Units Subcutaneous TID WC  . insulin aspart  0-5 Units Subcutaneous QHS  .  insulin glargine  45 Units Subcutaneous QHS  . iron polysaccharides  150 mg Oral TID  . loratadine  10 mg Oral Daily  . metoCLOPramide  5 mg Oral TID AC  . metoprolol succinate  50 mg Oral BID  . nystatin   Topical TID  . pantoprazole  40 mg Oral BID  . polyethylene glycol  17 g Oral Daily  . potassium chloride SA  20 mEq Oral Daily  . senna  1 tablet Oral Daily  . traZODone  50 mg Oral QHS  . vitamin E  1,000 Units Oral Daily    Assessment/Plan:  1. 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 Patient continues to improve continue supportive care 2. Type 2 diabetes- Increase Lantus to 48 units and sliding scale. Continue current therapy  3. Hyperlipidemia unspecified. Atorvain stopped 4. Essential hypertension continue  on Norvasc and Toprol 5. GERD on Protonix 6. History of diabetic gastroparesis on Reglan 7. History of diastolic congestive heart failure and lower extremity edema. Hold Lasix with creatinine starting to creep up.  Patient must eat better. 8. Weakness. Unable to do much with physical therapy 9. Failure to thrive. Patient must eat better in order to survive   Code Status:     Code Status Orders        Start     Ordered   10/05/16 1217  Do not attempt resuscitation (DNR)  Continuous    Question Answer Comment  In the event of cardiac or respiratory ARREST Do not call a "code blue"   In the event of cardiac or respiratory ARREST Do not perform Intubation, CPR, defibrillation or ACLS   In the event of cardiac or respiratory ARREST Use medication by any route, position, wound care, and other measures to relive pain and suffering. May use oxygen, suction and manual treatment of airway obstruction as needed for comfort.      10/05/16 1216    Code Status History    Date Active Date Inactive Code Status Order ID Comments User Context   09/24/2016  5:31 PM 10/05/2016 12:16 PM Full Code [id number]  Gladstone Lighter, MD Inpatient   09/01/2016  2:17 AM 09/05/2016  4:28 PM Full Code [id number]  Harvie Bridge, DO Inpatient   08/09/2016  8:27 PM 08/12/2016  8:15 PM Full Code [id number]  Henreitta Leber, MD Inpatient   09/30/2015  2:23 PM 10/07/2015  5:37 PM Full Code [id number]  Loletha Grayer, MD ED   09/05/2015  5:56 PM 09/09/2015  5:38 PM DNR [id number]  Loletha Grayer, MD ED    Advance Directive Documentation   [place] Most Recent Value  Type of Advance Directive  Living will  Pre-existing out of facility DNR order (yellow form or pink MOST form)  No data  "MOST" Form in Place?  No data     Disposition Plan: Family will have to set up 24 7 care at home and they will need someone to give insulin injections. Sister states that she is working on things. Spoke with care manager and they state the family has to arrange this. Unable to safely send the patient home until things are set up where the patient can receive insulin injections.   Consultants:  Psychiatry  Time spent: 28 minutes  [place],  [facility]

## 2016-10-18 LAB — GLUCOSE, CAPILLARY
GLUCOSE-CAPILLARY: 193 mg/dL — AB (ref 65–99)
GLUCOSE-CAPILLARY: 208 mg/dL — AB (ref 65–99)
Glucose-Capillary: 193 mg/dL — ABNORMAL HIGH (ref 65–99)
Glucose-Capillary: 332 mg/dL — ABNORMAL HIGH (ref 65–99)
Glucose-Capillary: 212 mg/dL — ABNORMAL HIGH (ref 65–99)

## 2016-10-18 MED ORDER — INSULIN ASPART 100 UNIT/ML ~~LOC~~ SOLN
3.0000 [IU] | Freq: Three times a day (TID) | SUBCUTANEOUS | Status: DC
  Administered 2016-10-18 – 2016-10-19 (×2): 3 [IU] via SUBCUTANEOUS
  Filled 2016-10-18: qty 3

## 2016-10-18 NOTE — Progress Notes (Signed)
Patient ID: Carolyn Valdez, female   DOB: 07/05/1941, 76 y.o.   MRN: [id number]   [facility] PROGRESS NOTE  LILIANN FILE TJQ:[id number] DOB: 03/06/1941 DOA: 09/24/2016 PCP: Leonel Ramsay, MD  HPI/Subjective: Continues to be confused. No more awake and alert    objective  Vitals:   10/18/16 0859 10/18/16 0906  BP: (!) 130/36 122/62  Pulse: 82   Resp: 18   Temp: 98.9 F (37.2 C)     Filed Weights   09/24/16 1159 10/14/16 1432  Weight: 290 lb (131.5 kg) 252 lb 12.8 oz (114.7 kg)    ROS: Review of Systems  Unable to perform ROS: Acuity of condition  Respiratory: Negative for shortness of breath.   Cardiovascular: Negative for chest pain.  Gastrointestinal: Negative for abdominal pain.   Exam: Physical Exam  HENT:  Nose: No mucosal edema.  Mouth/Throat: No oropharyngeal exudate or posterior oropharyngeal edema.  Eyes: Conjunctivae, EOM and lids are normal. Pupils are equal, round, and reactive to light.  Neck: No JVD present. Carotid bruit is not present. No edema present. No thyroid mass and no thyromegaly present.  Cardiovascular: S1 normal and S2 normal.  Exam reveals no gallop.   Murmur heard.  Systolic murmur is present with a grade of 2/6  Pulses:      Dorsalis pedis pulses are 2+ on the right side, and 2+ on the left side.  Respiratory: No respiratory distress. She has no decreased breath sounds. She has no wheezes. She has no rhonchi. She has no rales.  GI: Soft. Bowel sounds are normal. There is no tenderness.  Musculoskeletal:       Right ankle: She exhibits swelling.       Left ankle: She exhibits swelling.  Lymphadenopathy:    She has no cervical adenopathy.  Neurological: She is alert. No cranial nerve deficit.  Skin: Skin is warm. No rash noted. Nails show no clubbing.  Psychiatric: Her affect is blunt. She is slowed.      Data Reviewed: Basic Metabolic Panel:  Recent Labs Lab 10/13/16 0454 10/16/16 0520  NA 134*  --   K 4.4  --   CL  94*  --   CO2 32  --   GLUCOSE 192*  --   BUN 31*  --   CREATININE 1.09* 0.78  CALCIUM 10.1  --   MG 1.9  --    BNP (last 3 results)  Recent Labs  02/23/16 1617 08/09/16 1550 08/31/16 2145  BNP 181.0* 311.0* 163.0*     CBG:  Recent Labs Lab 10/17/16 1148 10/17/16 1640 10/17/16 2136 10/18/16 0743 10/18/16 1221  GLUCAP 324* 196* 218* 193* 332*     Scheduled Meds: . amLODipine  10 mg Oral Daily  . ARIPiprazole  7.5 mg Oral Daily  . citalopram  30 mg Oral Daily  . clonazePAM  0.25 mg Oral BID  . conjugated estrogens  1 Applicatorful Vaginal Once per day on Mon Wed Fri  . darifenacin  15 mg Oral Daily  . desmopressin  0.2 mg Oral QHS  . docusate sodium  100 mg Oral BID  . enoxaparin (LOVENOX) injection  40 mg Subcutaneous Q12H  . feeding supplement (ENSURE ENLIVE)  237 mL Oral TID BM  . insulin aspart  0-15 Units Subcutaneous TID WC  . insulin aspart  0-5 Units Subcutaneous QHS  . insulin glargine  48 Units Subcutaneous QHS  . iron polysaccharides  150 mg Oral TID  . loratadine  10 mg  Oral Daily  . metoCLOPramide  5 mg Oral TID AC  . metoprolol succinate  50 mg Oral BID  . nystatin   Topical TID  . pantoprazole  40 mg Oral BID  . polyethylene glycol  17 g Oral Daily  . potassium chloride SA  20 mEq Oral Daily  . senna  1 tablet Oral Daily  . traZODone  50 mg Oral QHS  . vitamin E  1,000 Units Oral Daily    Assessment/Plan:  1. 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 Patient continues to improve continue supportive careSeen by psychiatry planning for ECT therapy on Wednesday 2. Type 2 diabetes- blood sugar still elevated we'll add pre-meal schedule insulin 3. Hyperlipidemia unspecified. Atorvain stopped 4. Essential hypertension on Norvasc and Toprol 5. GERD on Protonix 6. History of diabetic gastroparesis on Reglan 7. History of diastolic congestive heart failure and lower extremity edema.resume lasix 8. Weakness. Unable to do much with  physical therapy 9. Failure to thrive.  Nutrition improved   Code Status:     Code Status Orders        Start     Ordered   10/05/16 1217  Do not attempt resuscitation (DNR)  Continuous    Question Answer Comment  In the event of cardiac or respiratory ARREST Do not call a "code blue"   In the event of cardiac or respiratory ARREST Do not perform Intubation, CPR, defibrillation or ACLS   In the event of cardiac or respiratory ARREST Use medication by any route, position, wound care, and other measures to relive pain and suffering. May use oxygen, suction and manual treatment of airway obstruction as needed for comfort.      10/05/16 1216    Code Status History    Date Active Date Inactive Code Status Order ID Comments User Context   09/24/2016  5:31 PM 10/05/2016 12:16 PM Full Code [id number]  Gladstone Lighter, MD Inpatient   09/01/2016  2:17 AM 09/05/2016  4:28 PM Full Code [id number]  Harvie Bridge, DO Inpatient   08/09/2016  8:27 PM 08/12/2016  8:15 PM Full Code [id number]  Henreitta Leber, MD Inpatient   09/30/2015  2:23 PM 10/07/2015  5:37 PM Full Code [id number]  Loletha Grayer, MD ED   09/05/2015  5:56 PM 09/09/2015  5:38 PM DNR [id number]  Loletha Grayer, MD ED    Advance Directive Documentation   [place] Most Recent Value  Type of Advance Directive  Living will  Pre-existing out of facility DNR order (yellow form or pink MOST form)  No data  "MOST" Form in Place?  No data     Disposition Plan:As per psychiatry no today states that patient will need inpatient psychiatric Consultants:  Psychiatry  Time spent: 28 minutes  [place], [facility]

## 2016-10-18 NOTE — Consult Note (Signed)
[facility] Consult   Reason for Consult:  This is a follow-up consult for this 76 year old woman with depression Referring Physician:  Leslye Peer Patient Identification: Carolyn Valdez MRN:  [id number] Principal Diagnosis: Severe major depression, single episode, with psychotic features [facility]) Diagnosis:   Patient Active Problem List   Diagnosis Date Noted  . DNR (do not resuscitate) [Z66] 10/05/2016  . Palliative care by specialist [Z51.5] 10/05/2016  . Depression, major, recurrent, severe with psychosis ([place]) [F33.3] 10/05/2016  . Severe major depression, single episode, with psychotic features ([place]) [F32.3] 10/04/2016  . Cellulitis of leg, right [L03.115] 09/29/2016  . Proctitis [K62.89] 09/29/2016  . Hypercapnia [R06.89] 09/29/2016  . Acute urinary retention [R33.8] 09/29/2016  . UTI (urinary tract infection) [N39.0] 09/29/2016  . Weakness [R53.1] 09/29/2016  . Subacute delirium [F05] 09/28/2016  . Gastrointestinal hemorrhage [K92.2]   . Diffuse abdominal pain [R10.84]   . Altered mental status [R41.82] 09/24/2016  . Pressure injury of skin [L89.90] 09/02/2016  . Respiratory failure with hypoxia ([place]) [J96.91] 09/01/2016  . Lymphedema [I89.0] 08/16/2016  . CHF (congestive heart failure) ([place]) [I50.9] 08/09/2016  . Congestive heart failure ([place]) [I50.9] 11/25/2015  . Nocturia [R35.1] 11/05/2015  . Urinary frequency [R35.0] 11/05/2015  . Acute respiratory failure with hypoxia ([place]) [J96.01] 09/05/2015  . Recurrent UTI [N39.0] 04/20/2015  . Incontinence [R32] 04/20/2015  . Diabetes mellitus, type 2 ([place]) [E11.9] 04/17/2015  . ESBL (extended spectrum beta-lactamase) producing bacteria infection [A49.9, Z16.12] 04/17/2015  . BP (high blood pressure) [I10] 04/17/2015  . Frequent UTI [N39.0] 04/17/2015  . Absence of bladder continence [R32] 04/17/2015  . Iron deficiency anemia [D50.9] 03/08/2015  . Absolute anemia [D64.9] 09/25/2014  . Abdominal pain, lower [R10.30]  09/25/2014  . Urge incontinence [N39.41] 02/19/2013  . FOM (frequency of micturition) [R35.0] 02/19/2013  . Bladder infection, chronic [N30.20] 08/11/2012  . Difficult or painful urination [R30.0] 07/26/2012  . Lower urinary tract infection [N39.0] 12/31/2011  . Diabetes mellitus ([place]) [E11.9] 12/31/2011    Total Time spent with patient: 20 minutes  Subjective:   Carolyn Valdez is a 76 y.o. female patient admitted with patient made no comment.  HPI:  Follow-up January 1. Patient seen chart reviewed. Patient was essentially the same as she was the last times I saw her. She would turn her head and respond to me with a couple of words but nothing really lucid. One change was that when I started to tell her at some length about how I was going to get her sister to give consent for ECT the patient was able to lucidly say "no you won't, she is not my guardian". I told patient that she had a point but that I thought that she, the patient, had no capacity right now to make decisions. The patient did not continue to argue with me or have any other comment at that point. Overall still looks very withdrawn.  Past Psychiatric History: Past history of depression possible bipolar disorder.  Risk to Self: Is patient at risk for suicide?: No Risk to Others:   Prior Inpatient Therapy:   Prior Outpatient Therapy:    Past Medical History:  Past Medical History:  Diagnosis Date  . Anxiety   . Breast cancer ([place])    16 yrs ago  . CHF (congestive heart failure) ([place])   . Diabetes mellitus without complication ([place])   . DJD (degenerative joint disease)   . Dysuria   . H/O total knee replacement    right  .  HTN (hypertension)   . Hypercholesteremia   . Kidney stone   . Obesity   . Recurrent urinary tract infection   . SVT (supraventricular tachycardia) ([place])   . Urinary incontinence   . Vaginal atrophy   . Yeast vaginitis     Past Surgical History:  Procedure Laterality Date  . CARDIAC  CATHETERIZATION    . CHOLECYSTECTOMY    . FOOT SURGERY    . JOINT REPLACEMENT    . MASTECTOMY Right 1997  . NASAL SEPTUM SURGERY    . PERIPHERAL VASCULAR CATHETERIZATION N/A 07/08/2015   Procedure: PICC Line Insertion;  Surgeon: Algernon Huxley, MD;  Location: [facility];  Service: Cardiovascular;  Laterality: N/A;  . TONSILLECTOMY    . Vocal cords     Family History:  Family History  Problem Relation Age of Onset  . Lung cancer Father   . Hematuria Mother   . Lung cancer Mother   . Kidney disease Neg Hx   . Bladder Cancer Neg Hx   . Breast cancer Neg Hx    Family Psychiatric  History: Some depression Social History:  History  Alcohol Use No     History  Drug Use No    Social History   Social History  . Marital status: Widowed    Spouse name: N/A  . Number of children: N/A  . Years of education: N/A   Social History Main Topics  . Smoking status: Former Research scientist (life sciences)  . Smokeless tobacco: Former Systems developer    Quit date: 10/29/1995  . Alcohol use No  . Drug use: No  . Sexual activity: Not Asked   Other Topics Concern  . None   Social History Narrative  . None   Additional Social History:    Allergies:   Allergies  Allergen Reactions  . Sulfa Antibiotics Hives  . Biaxin [Clarithromycin] Hives  . Influenza A (H1n1) Monoval Vac Other (See Comments)    Pt states that she was told by her MD not to get the influenza vaccine.    . Morphine Other (See Comments)    Reaction:  Dizziness and confusion   . Pyridium [Phenazopyridine Hcl] Other (See Comments)    Reaction:  Unknown   . Ceftriaxone Anxiety  . Latex Rash  . Prednisone Rash  . Tape Rash    Labs:  Results for orders placed or performed during the hospital encounter of 09/24/16 (from the past 48 hour(s))  Glucose, capillary     Status: Abnormal   Collection Time: 10/16/16  4:38 PM  Result Value Ref Range   Glucose-Capillary 194 (H) 65 - 99 mg/dL  Glucose, capillary     Status: Abnormal   Collection  Time: 10/16/16  9:04 PM  Result Value Ref Range   Glucose-Capillary 205 (H) 65 - 99 mg/dL  Glucose, capillary     Status: Abnormal   Collection Time: 10/17/16  7:29 AM  Result Value Ref Range   Glucose-Capillary 215 (H) 65 - 99 mg/dL   Comment 1 Notify RN   Glucose, capillary     Status: Abnormal   Collection Time: 10/17/16 11:48 AM  Result Value Ref Range   Glucose-Capillary 324 (H) 65 - 99 mg/dL   Comment 1 Notify RN   Glucose, capillary     Status: Abnormal   Collection Time: 10/17/16  4:40 PM  Result Value Ref Range   Glucose-Capillary 196 (H) 65 - 99 mg/dL   Comment 1 Notify RN   Glucose, capillary  Status: Abnormal   Collection Time: 10/17/16  9:36 PM  Result Value Ref Range   Glucose-Capillary 218 (H) 65 - 99 mg/dL  Glucose, capillary     Status: Abnormal   Collection Time: 10/18/16  7:43 AM  Result Value Ref Range   Glucose-Capillary 193 (H) 65 - 99 mg/dL   Comment 1 Notify RN   Glucose, capillary     Status: Abnormal   Collection Time: 10/18/16 12:21 PM  Result Value Ref Range   Glucose-Capillary 332 (H) 65 - 99 mg/dL    Current Facility-Administered Medications  Medication Dose Route Frequency Provider Last Rate Last Dose  . acetaminophen (TYLENOL) tablet 650 mg  650 mg Oral Q6H PRN Gladstone Lighter, MD   650 mg at 09/27/16 0920   Or  . acetaminophen (TYLENOL) suppository 650 mg  650 mg Rectal Q6H PRN Gladstone Lighter, MD      . alum & mag hydroxide-simeth (MAALOX/MYLANTA) 200-200-20 MG/5ML suspension 30 mL  30 mL Oral Q4H PRN Theodoro Grist, MD   30 mL at 09/25/16 2359  . amLODipine (NORVASC) tablet 10 mg  10 mg Oral Daily Theodoro Grist, MD   10 mg at 10/18/16 1103  . ARIPiprazole (ABILIFY) tablet 7.5 mg  7.5 mg Oral Daily Gonzella Lex, MD   7.5 mg at 10/18/16 1101  . citalopram (CELEXA) tablet 30 mg  30 mg Oral Daily Gonzella Lex, MD   30 mg at 10/18/16 1102  . clonazePAM (KLONOPIN) tablet 0.25 mg  0.25 mg Oral BID Gonzella Lex, MD   0.25 mg at 10/18/16  1102  . conjugated estrogens (PREMARIN) vaginal cream 1 Applicatorful  1 Applicatorful Vaginal Once per day on Mon Wed Fri Gladstone Lighter, MD   1 Applicatorful at 16/01/09 0853  . darifenacin (ENABLEX) 24 hr tablet 15 mg  15 mg Oral Daily Theodoro Grist, MD   15 mg at 10/18/16 1104  . desmopressin (DDAVP) tablet 0.2 mg  0.2 mg Oral QHS Gladstone Lighter, MD   0.2 mg at 10/17/16 2132  . docusate sodium (COLACE) capsule 100 mg  100 mg Oral BID Napoleon Form, RPH   100 mg at 10/18/16 1047  . enoxaparin (LOVENOX) injection 40 mg  40 mg Subcutaneous Q12H Gladstone Lighter, MD   40 mg at 10/18/16 1046  . feeding supplement (ENSURE ENLIVE) (ENSURE ENLIVE) liquid 237 mL  237 mL Oral TID BM Henreitta Leber, MD   237 mL at 10/18/16 1101  . insulin aspart (novoLOG) injection 0-15 Units  0-15 Units Subcutaneous TID WC Loletha Grayer, MD   11 Units at 10/18/16 1259  . insulin aspart (novoLOG) injection 0-5 Units  0-5 Units Subcutaneous QHS Gladstone Lighter, MD   2 Units at 10/17/16 2200  . insulin glargine (LANTUS) injection 48 Units  48 Units Subcutaneous QHS Dustin Flock, MD   48 Units at 10/17/16 2204  . iron polysaccharides (NIFEREX) capsule 150 mg  150 mg Oral TID Gladstone Lighter, MD   150 mg at 10/18/16 1046  . loratadine (CLARITIN) tablet 10 mg  10 mg Oral Daily Gladstone Lighter, MD   10 mg at 10/18/16 1046  . metoCLOPramide (REGLAN) tablet 5 mg  5 mg Oral TID AC Theodoro Grist, MD   5 mg at 10/18/16 1117  . metoprolol succinate (TOPROL-XL) 24 hr tablet 50 mg  50 mg Oral BID Theodoro Grist, MD   50 mg at 10/18/16 1104  . nystatin (MYCOSTATIN/NYSTOP) topical powder   Topical TID Gladstone Lighter, MD      .  ondansetron (ZOFRAN) tablet 4 mg  4 mg Oral Q6H PRN Gladstone Lighter, MD   4 mg at 09/29/16 1109   Or  . ondansetron (ZOFRAN) injection 4 mg  4 mg Intravenous Q6H PRN Gladstone Lighter, MD   4 mg at 09/27/16 0950  . pantoprazole (PROTONIX) EC tablet 40 mg  40 mg Oral BID Gladstone Lighter, MD    40 mg at 10/18/16 1046  . polyethylene glycol (MIRALAX / GLYCOLAX) packet 17 g  17 g Oral Daily Dustin Flock, MD   17 g at 10/17/16 0954  . polyvinyl alcohol (LIQUIFILM TEARS) 1.4 % ophthalmic solution 1 drop  1 drop Both Eyes PRN Theodoro Grist, MD      . potassium chloride SA (K-DUR,KLOR-CON) CR tablet 20 mEq  20 mEq Oral Daily Gladstone Lighter, MD   20 mEq at 10/18/16 1046  . senna (SENOKOT) tablet 8.6 mg  1 tablet Oral Daily Napoleon Form, RPH   8.6 mg at 10/18/16 1046  . traZODone (DESYREL) tablet 50 mg  50 mg Oral QHS Loletha Grayer, MD   50 mg at 10/17/16 2132  . vitamin E capsule 1,000 Units  1,000 Units Oral Daily Gladstone Lighter, MD   1,000 Units at 10/18/16 1046    Musculoskeletal: Strength & Muscle Tone: decreased Gait & Station: unable to stand Patient leans: N/A  Psychiatric Specialty Exam: Physical Exam  Constitutional: She appears well-developed.  HENT:  Head: Normocephalic and atraumatic.  Eyes: Conjunctivae are normal. Pupils are equal, round, and reactive to light.  Neck: Normal range of motion.  Cardiovascular: Normal heart sounds.   Respiratory: Effort normal.  GI: Soft.  Musculoskeletal: Normal range of motion.  Neurological: She is alert.  Skin: Skin is warm and dry.  Psychiatric: Her affect is inappropriate. Her speech is delayed. She is withdrawn. Cognition and memory are impaired. She is noncommunicative.    Review of Systems  Unable to perform ROS: Psychiatric disorder    Blood pressure 122/62, pulse 82, temperature 98.9 F (37.2 C), temperature source Oral, resp. rate 18, height 5\' 8"  (1.727 m), weight 114.7 kg (252 lb 12.8 oz), SpO2 91 %.Body mass index is 38.44 kg/m.  General Appearance: Fairly Groomed  Eye Contact:  Minimal  Speech:  Negative  Volume:  Decreased  Mood:  Negative  Affect:  Negative  Thought Process:  NA  Orientation:  Negative  Thought Content:  Negative  Suicidal Thoughts:  Unknown  Homicidal Thoughts:  Unknown   Memory:  Negative  Judgement:  Negative  Insight:  Negative  Psychomotor Activity:  Negative  Concentration:  Concentration: Negative  Recall:  Negative  Fund of Knowledge:  Negative  Language:  Negative  Akathisia:  Negative  Handed:  Right  AIMS (if indicated):     Assets:  Social Support  ADL's:  Impaired  Cognition:  Impaired,  Severe  Sleep:        Treatment Plan Summary: Daily contact with patient to assess and evaluate symptoms and progress in treatment, Medication management and Plan I understand that discharge planning is still going on trying to find a place where her diabetes can be taken care of. I am going to in the meantime however pursue the possibility of ECT by contacting her sister to see if she would be willing to sign consent for treatment on Wednesday.  Disposition: Recommend psychiatric Inpatient admission when medically cleared. Supportive therapy provided about ongoing stressors.  Alethia Berthold, MD 10/18/2016 1:10 PM

## 2016-10-19 ENCOUNTER — Other Ambulatory Visit: Payer: Self-pay | Admitting: Psychiatry

## 2016-10-19 LAB — BASIC METABOLIC PANEL
ANION GAP: 6 (ref 5–15)
BUN: 38 mg/dL — ABNORMAL HIGH (ref 6–20)
CHLORIDE: 93 mmol/L — AB (ref 101–111)
CO2: 30 mmol/L (ref 22–32)
Calcium: 10.2 mg/dL (ref 8.9–10.3)
Creatinine, Ser: 1.31 mg/dL — ABNORMAL HIGH (ref 0.44–1.00)
GFR calc Af Amer: 46 mL/min — ABNORMAL LOW (ref >60)
GFR calc non Af Amer: 39 mL/min — ABNORMAL LOW (ref >60)
Glucose, Bld: 289 mg/dL — ABNORMAL HIGH (ref 65–99)
POTASSIUM: 5.1 mmol/L (ref 3.5–5.1)
Sodium: 129 mmol/L — ABNORMAL LOW (ref 135–145)

## 2016-10-19 LAB — GLUCOSE, CAPILLARY
Glucose-Capillary: 214 mg/dL — ABNORMAL HIGH (ref 65–99)
Glucose-Capillary: 242 mg/dL — ABNORMAL HIGH (ref 65–99)
Glucose-Capillary: 258 mg/dL — ABNORMAL HIGH (ref 65–99)
Glucose-Capillary: 267 mg/dL — ABNORMAL HIGH (ref 65–99)
Glucose-Capillary: 273 mg/dL — ABNORMAL HIGH (ref 65–99)

## 2016-10-19 MED ORDER — ATORVASTATIN CALCIUM 20 MG PO TABS
20.0000 mg | ORAL_TABLET | Freq: Every day | ORAL | Status: DC
  Administered 2016-10-19 – 2016-10-25 (×5): 20 mg via ORAL
  Filled 2016-10-19 (×6): qty 1

## 2016-10-19 MED ORDER — FUROSEMIDE 20 MG PO TABS
20.0000 mg | ORAL_TABLET | Freq: Every day | ORAL | Status: DC
  Administered 2016-10-19 – 2016-10-26 (×6): 20 mg via ORAL
  Filled 2016-10-19 (×6): qty 1

## 2016-10-19 MED ORDER — INSULIN GLARGINE 100 UNIT/ML ~~LOC~~ SOLN
52.0000 [IU] | Freq: Every day | SUBCUTANEOUS | Status: DC
  Administered 2016-10-20: 52 [IU] via SUBCUTANEOUS
  Filled 2016-10-19 (×2): qty 0.52

## 2016-10-19 MED ORDER — INSULIN GLARGINE 100 UNIT/ML ~~LOC~~ SOLN: 50.0000 [IU] | Freq: Every day | SUBCUTANEOUS | Status: DC

## 2016-10-19 NOTE — Progress Notes (Signed)
Patient ID: Carolyn Valdez, female   DOB: 1940/12/28, 76 y.o.   MRN: [id number]   [facility] PROGRESS NOTE  Carolyn Valdez:[id number] DOB: [birth year] DOA: 09/24/2016 PCP: Carolyn Ramsay, MD  HPI/Subjective: She is confused    objective  Vitals:   10/19/16 0608 10/19/16 1025  BP: (!) 141/54 (!) 138/43  Pulse: 73 80  Resp: 18 16  Temp: 98.4 F (36.9 C) 98.2 F (36.8 C)    Filed Weights   09/24/16 1159 10/14/16 1432  Weight: 290 lb (131.5 kg) 252 lb 12.8 oz (114.7 kg)    ROS: Review of Systems  Unable to perform ROS: Acuity of condition  Respiratory: Negative for shortness of breath.   Cardiovascular: Negative for chest pain.  Gastrointestinal: Negative for abdominal pain.   Exam: Physical Exam  HENT:  Nose: No mucosal edema.  Mouth/Throat: No oropharyngeal exudate or posterior oropharyngeal edema.  Eyes: Conjunctivae, EOM and lids are normal. Pupils are equal, round, and reactive to light.  Neck: No JVD present. Carotid bruit is not present. No edema present. No thyroid mass and no thyromegaly present.  Cardiovascular: S1 normal and S2 normal.  Exam reveals no gallop.   Murmur heard.  Systolic murmur is present with a grade of 2/6  Pulses:      Dorsalis pedis pulses are 2+ on the right side, and 2+ on the left side.  Respiratory: No respiratory distress. She has no decreased breath sounds. She has no wheezes. She has no rhonchi. She has no rales.  GI: Soft. Bowel sounds are normal. There is no tenderness.  Musculoskeletal:       Right ankle: She exhibits swelling.       Left ankle: She exhibits swelling.  Lymphadenopathy:    She has no cervical adenopathy.  Neurological: She is alert. No cranial nerve deficit.  Skin: Skin is warm. No rash noted. Nails show no clubbing.  Psychiatric: Her affect is blunt. She is slowed.      Data Reviewed: Basic Metabolic Panel:  Recent Labs Lab 10/13/16 0454 10/16/16 0520  NA 134*  --   K 4.4  --   CL 94*  --    CO2 32  --   GLUCOSE 192*  --   BUN 31*  --   CREATININE 1.09* 0.78  CALCIUM 10.1  --   MG 1.9  --    BNP (last 3 results)  Recent Labs  02/23/16 1617 08/09/16 1550 08/31/16 2145  BNP 181.0* 311.0* 163.0*     CBG:  Recent Labs Lab 10/18/16 1221 10/18/16 1653 10/18/16 2058 10/19/16 0750 10/19/16 1140  GLUCAP 332* 208* 212* 214* 267*     Scheduled Meds: . amLODipine  10 mg Oral Daily  . ARIPiprazole  7.5 mg Oral Daily  . citalopram  30 mg Oral Daily  . clonazePAM  0.25 mg Oral BID  . conjugated estrogens  1 Applicatorful Vaginal Once per day on Mon Wed Fri  . darifenacin  15 mg Oral Daily  . desmopressin  0.2 mg Oral QHS  . docusate sodium  100 mg Oral BID  . enoxaparin (LOVENOX) injection  40 mg Subcutaneous Q12H  . feeding supplement (ENSURE ENLIVE)  237 mL Oral TID BM  . insulin aspart  0-15 Units Subcutaneous TID WC  . insulin aspart  0-5 Units Subcutaneous QHS  . insulin glargine  52 Units Subcutaneous QHS  . iron polysaccharides  150 mg Oral TID  . loratadine  10 mg Oral Daily  .  metoCLOPramide  5 mg Oral TID AC  . metoprolol succinate  50 mg Oral BID  . nystatin   Topical TID  . pantoprazole  40 mg Oral BID  . polyethylene glycol  17 g Oral Daily  . potassium chloride SA  20 mEq Oral Daily  . senna  1 tablet Oral Daily  . traZODone  50 mg Oral QHS  . vitamin E  1,000 Units Oral Daily    Assessment/Plan:  1. 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  Case discussed with Dr. Carlena Hurl he recommends ECT therapy tomm 2. Type 2 diabetes- patient's disposition will be complicated I will stop her pre-meal insulin will do Lovenox once a day at a higher dose 3. Hyperlipidemia unspecified. continue atorvastatin 4. Essential hypertension on Norvasc and Toprol 5. GERD on Protonix 6. History of diabetic gastroparesis on Reglan 7. History of diastolic congestive heart failure and lower extremity edema.resume lasix 8. Weakness. Unable to do much  with physical therapy 9. Failure to thrive.  Nutrition improved   Code Status:     Code Status Orders        Start     Ordered   10/05/16 1217  Do not attempt resuscitation (DNR)  Continuous    Question Answer Comment  In the event of cardiac or respiratory ARREST Do not call a "code blue"   In the event of cardiac or respiratory ARREST Do not perform Intubation, CPR, defibrillation or ACLS   In the event of cardiac or respiratory ARREST Use medication by any route, position, wound care, and other measures to relive pain and suffering. May use oxygen, suction and manual treatment of airway obstruction as needed for comfort.      10/05/16 1216    Code Status History    Date Active Date Inactive Code Status Order ID Comments User Context   09/24/2016  5:31 PM 10/05/2016 12:16 PM Full Code [id number]  Gladstone Lighter, MD Inpatient   09/01/2016  2:17 AM 09/05/2016  4:28 PM Full Code [id number]  Harvie Bridge, DO Inpatient   08/09/2016  8:27 PM 08/12/2016  8:15 PM Full Code [id number]  Henreitta Leber, MD Inpatient   09/30/2015  2:23 PM 10/07/2015  5:37 PM Full Code [id number]  Loletha Grayer, MD ED   09/05/2015  5:56 PM 09/09/2015  5:38 PM DNR [id number]  Loletha Grayer, MD ED    Advance Directive Documentation   [place] Most Recent Value  Type of Advance Directive  Living will  Pre-existing out of facility DNR order (yellow form or pink MOST form)  No data  "MOST" Form in Place?  No data     Disposition Plan:As per psychiatry no today states that patient will need inpatient psychiatric Consultants:  Psychiatry  Time spent: 28 minutes  [place]

## 2016-10-19 NOTE — Progress Notes (Signed)
Inpatient Diabetes Program Recommendations  AACE/ADA: New Consensus Statement on Inpatient Glycemic Control (2015)  Target Ranges:  Prepandial:   less than 140 mg/dL      Peak postprandial:   less than 180 mg/dL (1-2 hours)      Critically ill patients:  140 - 180 mg/dL   Results for Carolyn Valdez, Carolyn Valdez (MRN [id number]) as of 10/19/2016 10:23  Ref. Range 10/18/2016 07:43 10/18/2016 12:21 10/18/2016 16:53 10/18/2016 20:58  Glucose-Capillary Latest Ref Range: 65 - 99 mg/dL 193 (H) 332 (H) 208 (H) 212 (H)   Results for Carolyn Valdez, Carolyn Valdez (MRN [id number]) as of 10/19/2016 10:23  Ref. Range 10/19/2016 07:50  Glucose-Capillary Latest Ref Range: 65 - 99 mg/dL 214 (H)    Home DM Meds: Lantus 50 units QHS       Novolog 0-20 TID       Actos 30 mg daily  Current Insulin Orders: Lantus 48 units QHS      Novolog Moderate Correction Scale/ SSI (0-15 units) TID AC + HS      Novolog 3 units TID with meals     MD- Note Novolog 3 units TID with meals started last night at 5pm with evening meal.  Patient still having elevated fasting glucose levels as well.  Please consider increasing Lantus to 50 units QHS (home dose)     --Will follow patient during hospitalization--  Wyn Quaker RN, MSN, CDE Diabetes Coordinator Inpatient Glycemic Control Team Team Pager: [phone] (8a-5p)

## 2016-10-19 NOTE — Consult Note (Signed)
[facility] Consult   Reason for Consult:  This is a follow-up consult for this 76 year old woman with depression Referring Physician:  Leslye Peer Patient Identification: Carolyn Valdez MRN:  [id number] Principal Diagnosis: Severe major depression, single episode, with psychotic features [facility]) Diagnosis:   Patient Active Problem List   Diagnosis Date Noted  . DNR (do not resuscitate) [Z66] 10/05/2016  . Palliative care by specialist [Z51.5] 10/05/2016  . Depression, major, recurrent, severe with psychosis ([place]) [F33.3] 10/05/2016  . Severe major depression, single episode, with psychotic features ([place]) [F32.3] 10/04/2016  . Cellulitis of leg, right [L03.115] 09/29/2016  . Proctitis [K62.89] 09/29/2016  . Hypercapnia [R06.89] 09/29/2016  . Acute urinary retention [R33.8] 09/29/2016  . UTI (urinary tract infection) [N39.0] 09/29/2016  . Weakness [R53.1] 09/29/2016  . Subacute delirium [F05] 09/28/2016  . Gastrointestinal hemorrhage [K92.2]   . Diffuse abdominal pain [R10.84]   . Altered mental status [R41.82] 09/24/2016  . Pressure injury of skin [L89.90] 09/02/2016  . Respiratory failure with hypoxia ([place]) [J96.91] 09/01/2016  . Lymphedema [I89.0] 08/16/2016  . CHF (congestive heart failure) ([place]) [I50.9] 08/09/2016  . Congestive heart failure ([place]) [I50.9] 11/25/2015  . Nocturia [R35.1] 11/05/2015  . Urinary frequency [R35.0] 11/05/2015  . Acute respiratory failure with hypoxia ([place]) [J96.01] 09/05/2015  . Recurrent UTI [N39.0] 04/20/2015  . Incontinence [R32] 04/20/2015  . Diabetes mellitus, type 2 ([place]) [E11.9] 04/17/2015  . ESBL (extended spectrum beta-lactamase) producing bacteria infection [A49.9, Z16.12] 04/17/2015  . BP (high blood pressure) [I10] 04/17/2015  . Frequent UTI [N39.0] 04/17/2015  . Absence of bladder continence [R32] 04/17/2015  . Iron deficiency anemia [D50.9] 03/08/2015  . Absolute anemia [D64.9] 09/25/2014  . Abdominal pain, lower [R10.30]  09/25/2014  . Urge incontinence [N39.41] 02/19/2013  . FOM (frequency of micturition) [R35.0] 02/19/2013  . Bladder infection, chronic [N30.20] 08/11/2012  . Difficult or painful urination [R30.0] 07/26/2012  . Lower urinary tract infection [N39.0] 12/31/2011  . Diabetes mellitus ([place]) [E11.9] 12/31/2011    Total Time spent with patient: 20 minutes  Subjective:   Carolyn Valdez is a 76 y.o. female patient admitted with patient made no comment.  HPI: Follow-up for January 2. Patient seen. Spoke with her sister as well. Patient was slightly more verbal today but still extremely withdrawn and slow cognitively. Flat affect. I presented to both of the plan for ECT and the sister is agreeable.  Past Psychiatric History: Past history of depression possible bipolar disorder.  Risk to Self: Is patient at risk for suicide?: No Risk to Others:   Prior Inpatient Therapy:   Prior Outpatient Therapy:    Past Medical History:  Past Medical History:  Diagnosis Date  . Anxiety   . Breast cancer ([place])    16 yrs ago  . CHF (congestive heart failure) ([place])   . Diabetes mellitus without complication ([place])   . DJD (degenerative joint disease)   . Dysuria   . H/O total knee replacement    right  . HTN (hypertension)   . Hypercholesteremia   . Kidney stone   . Obesity   . Recurrent urinary tract infection   . SVT (supraventricular tachycardia) ([place])   . Urinary incontinence   . Vaginal atrophy   . Yeast vaginitis     Past Surgical History:  Procedure Laterality Date  . CARDIAC CATHETERIZATION    . CHOLECYSTECTOMY    . FOOT SURGERY    . JOINT REPLACEMENT    . MASTECTOMY Right 1997  . NASAL SEPTUM  SURGERY    . PERIPHERAL VASCULAR CATHETERIZATION N/A 07/08/2015   Procedure: PICC Line Insertion;  Surgeon: Algernon Huxley, MD;  Location: [facility];  Service: Cardiovascular;  Laterality: N/A;  . TONSILLECTOMY    . Vocal cords     Family History:  Family History  Problem Relation Age  of Onset  . Lung cancer Father   . Hematuria Mother   . Lung cancer Mother   . Kidney disease Neg Hx   . Bladder Cancer Neg Hx   . Breast cancer Neg Hx    Family Psychiatric  History: Some depression Social History:  History  Alcohol Use No     History  Drug Use No    Social History   Social History  . Marital status: Widowed    Spouse name: N/A  . Number of children: N/A  . Years of education: N/A   Social History Main Topics  . Smoking status: Former Research scientist (life sciences)  . Smokeless tobacco: Former Systems developer    Quit date: 10/29/1995  . Alcohol use No  . Drug use: No  . Sexual activity: Not Asked   Other Topics Concern  . None   Social History Narrative  . None   Additional Social History:    Allergies:   Allergies  Allergen Reactions  . Sulfa Antibiotics Hives  . Biaxin [Clarithromycin] Hives  . Influenza A (H1n1) Monoval Vac Other (See Comments)    Pt states that she was told by her MD not to get the influenza vaccine.    . Morphine Other (See Comments)    Reaction:  Dizziness and confusion   . Pyridium [Phenazopyridine Hcl] Other (See Comments)    Reaction:  Unknown   . Ceftriaxone Anxiety  . Latex Rash  . Prednisone Rash  . Tape Rash    Labs:  Results for orders placed or performed during the hospital encounter of 09/24/16 (from the past 48 hour(s))  Glucose, capillary     Status: Abnormal   Collection Time: 10/17/16  9:36 PM  Result Value Ref Range   Glucose-Capillary 218 (H) 65 - 99 mg/dL  Glucose, capillary     Status: Abnormal   Collection Time: 10/18/16  7:43 AM  Result Value Ref Range   Glucose-Capillary 193 (H) 65 - 99 mg/dL   Comment 1 Notify RN   Glucose, capillary     Status: Abnormal   Collection Time: 10/18/16 12:21 PM  Result Value Ref Range   Glucose-Capillary 332 (H) 65 - 99 mg/dL  Glucose, capillary     Status: Abnormal   Collection Time: 10/18/16  4:53 PM  Result Value Ref Range   Glucose-Capillary 208 (H) 65 - 99 mg/dL   Comment 1  Notify RN   Glucose, capillary     Status: Abnormal   Collection Time: 10/18/16  8:58 PM  Result Value Ref Range   Glucose-Capillary 212 (H) 65 - 99 mg/dL   Comment 1 Notify RN   Glucose, capillary     Status: Abnormal   Collection Time: 10/19/16  7:50 AM  Result Value Ref Range   Glucose-Capillary 214 (H) 65 - 99 mg/dL  Glucose, capillary     Status: Abnormal   Collection Time: 10/19/16 11:40 AM  Result Value Ref Range   Glucose-Capillary 267 (H) 65 - 99 mg/dL   Comment 1 Notify RN    Comment 2 Document in Chart   Glucose, capillary     Status: Abnormal   Collection Time: 10/19/16  4:56 PM  Result Value Ref Range   Glucose-Capillary 273 (H) 65 - 99 mg/dL  Basic metabolic panel     Status: Abnormal   Collection Time: 10/19/16  6:10 PM  Result Value Ref Range   Sodium 129 (L) 135 - 145 mmol/L   Potassium 5.1 3.5 - 5.1 mmol/L   Chloride 93 (L) 101 - 111 mmol/L   CO2 30 22 - 32 mmol/L   Glucose, Bld 289 (H) 65 - 99 mg/dL   BUN 38 (H) 6 - 20 mg/dL   Creatinine, Ser 1.31 (H) 0.44 - 1.00 mg/dL   Calcium 10.2 8.9 - 10.3 mg/dL   GFR calc non Af Amer 39 (L) >60 mL/min   GFR calc Af Amer 46 (L) >60 mL/min    Comment: (NOTE) The eGFR has been calculated using the CKD EPI equation. This calculation has not been validated in all clinical situations. eGFR's persistently <60 mL/min signify possible Chronic Kidney Disease.    Anion gap 6 5 - 15    Current Facility-Administered Medications  Medication Dose Route Frequency Provider Last Rate Last Dose  . acetaminophen (TYLENOL) tablet 650 mg  650 mg Oral Q6H PRN Gladstone Lighter, MD   650 mg at 09/27/16 0920   Or  . acetaminophen (TYLENOL) suppository 650 mg  650 mg Rectal Q6H PRN Gladstone Lighter, MD      . alum & mag hydroxide-simeth (MAALOX/MYLANTA) 200-200-20 MG/5ML suspension 30 mL  30 mL Oral Q4H PRN Theodoro Grist, MD   30 mL at 09/25/16 2359  . amLODipine (NORVASC) tablet 10 mg  10 mg Oral Daily Theodoro Grist, MD   10 mg at  10/19/16 1015  . ARIPiprazole (ABILIFY) tablet 7.5 mg  7.5 mg Oral Daily Gonzella Lex, MD   7.5 mg at 10/19/16 1014  . atorvastatin (LIPITOR) tablet 20 mg  20 mg Oral QHS Dustin Flock, MD      . citalopram (CELEXA) tablet 30 mg  30 mg Oral Daily Gonzella Lex, MD   30 mg at 10/19/16 1014  . clonazePAM (KLONOPIN) tablet 0.25 mg  0.25 mg Oral BID Gonzella Lex, MD   0.25 mg at 10/19/16 1013  . conjugated estrogens (PREMARIN) vaginal cream 1 Applicatorful  1 Applicatorful Vaginal Once per day on Mon Wed Fri Gladstone Lighter, MD   1 Applicatorful at 63/89/37 0853  . darifenacin (ENABLEX) 24 hr tablet 15 mg  15 mg Oral Daily Theodoro Grist, MD   15 mg at 10/19/16 1014  . desmopressin (DDAVP) tablet 0.2 mg  0.2 mg Oral QHS Gladstone Lighter, MD   0.2 mg at 10/18/16 2249  . docusate sodium (COLACE) capsule 100 mg  100 mg Oral BID Napoleon Form, RPH   100 mg at 10/19/16 1015  . enoxaparin (LOVENOX) injection 40 mg  40 mg Subcutaneous Q12H Gladstone Lighter, MD   40 mg at 10/19/16 1015  . feeding supplement (ENSURE ENLIVE) (ENSURE ENLIVE) liquid 237 mL  237 mL Oral TID BM Henreitta Leber, MD   237 mL at 10/19/16 1000  . furosemide (LASIX) tablet 20 mg  20 mg Oral Daily Dustin Flock, MD      . insulin aspart (novoLOG) injection 0-15 Units  0-15 Units Subcutaneous TID WC Loletha Grayer, MD   8 Units at 10/19/16 1743  . insulin aspart (novoLOG) injection 0-5 Units  0-5 Units Subcutaneous QHS Gladstone Lighter, MD   2 Units at 10/18/16 2248  . insulin glargine (LANTUS) injection 52 Units  52 Units Subcutaneous QHS Dustin Flock, MD      . iron polysaccharides (NIFEREX) capsule 150 mg  150 mg Oral TID Gladstone Lighter, MD   150 mg at 10/19/16 1745  . loratadine (CLARITIN) tablet 10 mg  10 mg Oral Daily Gladstone Lighter, MD   10 mg at 10/19/16 1014  . metoCLOPramide (REGLAN) tablet 5 mg  5 mg Oral TID AC Theodoro Grist, MD   5 mg at 10/19/16 1744  . metoprolol succinate (TOPROL-XL) 24 hr tablet 50 mg   50 mg Oral BID Theodoro Grist, MD   50 mg at 10/19/16 1014  . nystatin (MYCOSTATIN/NYSTOP) topical powder   Topical TID Gladstone Lighter, MD      . ondansetron [facility]) tablet 4 mg  4 mg Oral Q6H PRN Gladstone Lighter, MD   4 mg at 09/29/16 1109   Or  . ondansetron (ZOFRAN) injection 4 mg  4 mg Intravenous Q6H PRN Gladstone Lighter, MD   4 mg at 09/27/16 0950  . pantoprazole (PROTONIX) EC tablet 40 mg  40 mg Oral BID Gladstone Lighter, MD   40 mg at 10/19/16 1013  . polyethylene glycol (MIRALAX / GLYCOLAX) packet 17 g  17 g Oral Daily Dustin Flock, MD   17 g at 10/19/16 1015  . polyvinyl alcohol (LIQUIFILM TEARS) 1.4 % ophthalmic solution 1 drop  1 drop Both Eyes PRN Theodoro Grist, MD      . potassium chloride SA (K-DUR,KLOR-CON) CR tablet 20 mEq  20 mEq Oral Daily Gladstone Lighter, MD   20 mEq at 10/19/16 1015  . senna (SENOKOT) tablet 8.6 mg  1 tablet Oral Daily Napoleon Form, RPH   8.6 mg at 10/19/16 1014  . traZODone (DESYREL) tablet 50 mg  50 mg Oral QHS Loletha Grayer, MD   50 mg at 10/18/16 2251  . vitamin E capsule 1,000 Units  1,000 Units Oral Daily Gladstone Lighter, MD   1,000 Units at 10/19/16 1013    Musculoskeletal: Strength & Muscle Tone: decreased Gait & Station: unable to stand Patient leans: N/A  Psychiatric Specialty Exam: Physical Exam  Constitutional: She appears well-developed.  HENT:  Head: Normocephalic and atraumatic.  Eyes: Conjunctivae are normal. Pupils are equal, round, and reactive to light.  Neck: Normal range of motion.  Cardiovascular: Normal heart sounds.   Respiratory: Effort normal.  GI: Soft.  Musculoskeletal: Normal range of motion.  Neurological: She is alert.  Skin: Skin is warm and dry.  Psychiatric: Her affect is inappropriate. Her speech is delayed. She is withdrawn. Cognition and memory are impaired. She is noncommunicative.    Review of Systems  Unable to perform ROS: Psychiatric disorder    Blood pressure (!) 138/43, pulse 80,  temperature 98.2 F (36.8 C), temperature source Oral, resp. rate 16, height 5' 8"  (1.727 m), weight 114.7 kg (252 lb 12.8 oz), SpO2 93 %.Body mass index is 38.44 kg/m.  General Appearance: Fairly Groomed  Eye Contact:  Minimal  Speech:  Negative  Volume:  Decreased  Mood:  Negative  Affect:  Negative  Thought Process:  NA  Orientation:  Negative  Thought Content:  Negative  Suicidal Thoughts:  Unknown  Homicidal Thoughts:  Unknown  Memory:  Negative  Judgement:  Negative  Insight:  Negative  Psychomotor Activity:  Negative  Concentration:  Concentration: Negative  Recall:  Negative  Fund of Knowledge:  Negative  Language:  Negative  Akathisia:  Negative  Handed:  Right  AIMS (if indicated):     Assets:  Social Support  ADL's:  Impaired  Cognition:  Impaired,  Severe  Sleep:        Treatment Plan Summary: Daily contact with patient to assess and evaluate symptoms and progress in treatment, Medication management and Plan Orders completed to make her nothing by mouth after midnight tonight. Orders will be placed for ECT treatment for tomorrow. Sister states that she will be here between 78 and 10 to sign consent papers tomorrow at ECT. I have ordered an EKG and a metabolic panel for tonight to be up-to-date for anesthesia. Plan for right unilateral ECT treatment in the morning.  Disposition: Recommend psychiatric Inpatient admission when medically cleared. Supportive therapy provided about ongoing stressors.  Alethia Berthold, MD 10/19/2016 7:10 PM

## 2016-10-19 NOTE — Progress Notes (Signed)
Time documented in error. Meant for 13:25 JS, NT

## 2016-10-19 NOTE — Progress Notes (Signed)
Physical Therapy Treatment Patient Details Name: Carolyn Valdez MRN: [id number] DOB: [birth year] Today's Date: 10/19/2016    History of Present Illness Pt is a 76 y/o F who presents to the hospital due to AMS, confusion, urinary retention.  There is concern for possible severe constipation related proctitis.  Admiting dx: acute encephalopathy due to RLE cellulitis, stercoral proctitis.  Pt's PMH includes CHF, BLE edema, venous insufficiency, recurrent UTIs, obesity, SVT, TKA,  per pt L sciatica.    PT Comments    Pt was able to show some effort and AROM with exercises, but overall was very weak and needed excessive cuing and encouragement.  She struggled with attempts at getting to sitting EOB and at times even resisted.  PT gave max assist and pt was unable to achieve upright sitting and became agitated with attempts/encouragement to get to upright, deferred attempts at standing as she was unable/unsafe/unwilling to try.    Follow Up Recommendations   (per ability to participate)     Equipment Recommendations       Recommendations for Other Services       Precautions / Restrictions Precautions Precautions: Fall Restrictions Weight Bearing Restrictions: No    Mobility  Bed Mobility Overal bed mobility: Needs Assistance Bed Mobility: Sit to Supine;Supine to Sit     Supine to sit: Max assist Sit to supine: Max assist   General bed mobility comments: Pt with very little effort and even moments of resisting, pt was unable to get to fully upright sitting  Transfers                 General transfer comment: uanble/unsafe to even attempt, pt resistant to getting to fully upright sitting  Ambulation/Gait                 Stairs            Wheelchair Mobility    Modified Rankin (Stroke Patients Only)       Balance Overall balance assessment: Needs assistance   Sitting balance-Leahy Scale: Poor Sitting balance - Comments: Pt leaning back and to the R the  entire time, unable to achieve upright despite heavy assist, encouragement and cuing.                             Cognition Arousal/Alertness: Awake/alert Behavior During Therapy: Restless Overall Cognitive Status: Difficult to assess                 General Comments: Pt much more interactive than last PT session with this therapist - but still regularly off topic (hearing a dog, thinking someone else was in the mirror, reporting that she has been up and walking, etc)    Exercises General Exercises - Lower Extremity Ankle Circles/Pumps: Strengthening;10 reps;AROM Quad Sets: Strengthening;Both;10 reps;Supine Gluteal Sets: Strengthening;Both;10 reps Heel Slides: AAROM;10 reps;Both Hip ABduction/ADduction: AAROM;10 reps;Both    General Comments        Pertinent Vitals/Pain Pain Assessment:  (not rated, indicates pain with R knee movement (knee & back))    Home Living                      Prior Function            PT Goals (current goals can now be found in the care plan section) Progress towards PT goals: Not progressing toward goals - comment (better participation with exercises, poor mobility effort)  Frequency    Min 2X/week      PT Plan Current plan remains appropriate    Co-evaluation             End of Session   Activity Tolerance: Treatment limited secondary to agitation;Patient limited by fatigue       Time: 1520-1545 PT Time Calculation (min) (ACUTE ONLY): 25 min  Charges:  $Therapeutic Exercise: 8-22 mins $Therapeutic Activity: 8-22 mins                    G Codes:      Kreg Shropshire, DPT 10/19/2016, 4:40 PM

## 2016-10-20 ENCOUNTER — Inpatient Hospital Stay: Payer: Medicare Other | Admitting: Anesthesiology

## 2016-10-20 LAB — GLUCOSE, CAPILLARY
GLUCOSE-CAPILLARY: 280 mg/dL — AB (ref 65–99)
Glucose-Capillary: 282 mg/dL — ABNORMAL HIGH (ref 65–99)
Glucose-Capillary: 263 mg/dL — ABNORMAL HIGH (ref 65–99)
Glucose-Capillary: 257 mg/dL — ABNORMAL HIGH (ref 65–99)

## 2016-10-20 LAB — CREATININE, SERUM
CREATININE: 1.19 mg/dL — AB (ref 0.44–1.00)
GFR calc Af Amer: 51 mL/min — ABNORMAL LOW (ref >60)
GFR calc non Af Amer: 44 mL/min — ABNORMAL LOW (ref >60)

## 2016-10-20 MED ORDER — SODIUM CHLORIDE 0.9 % IV SOLN
500.0000 mL | Freq: Once | INTRAVENOUS | Status: AC
  Administered 2016-10-20: 10:00:00 via INTRAVENOUS

## 2016-10-20 MED ORDER — ARIPIPRAZOLE 2 MG PO TABS: 10.0000 mg | ORAL_TABLET | Freq: Every day | ORAL | Status: DC

## 2016-10-20 MED ORDER — INSULIN GLARGINE 100 UNIT/ML ~~LOC~~ SOLN
58.0000 [IU] | Freq: Every day | SUBCUTANEOUS | Status: DC
  Administered 2016-10-20: 58 [IU] via SUBCUTANEOUS
  Filled 2016-10-20 (×2): qty 0.58

## 2016-10-20 MED ORDER — SODIUM CHLORIDE 0.9 % IV SOLN
INTRAVENOUS | Status: DC | PRN
  Administered 2016-10-20: 11:00:00 via INTRAVENOUS

## 2016-10-20 MED ORDER — METHOHEXITAL SODIUM 100 MG/10ML IV SOSY
PREFILLED_SYRINGE | INTRAVENOUS | Status: DC | PRN
  Administered 2016-10-20: 100 mg via INTRAVENOUS

## 2016-10-20 MED ORDER — SUCCINYLCHOLINE CHLORIDE 200 MG/10ML IV SOSY
PREFILLED_SYRINGE | INTRAVENOUS | Status: AC
  Filled 2016-10-20: qty 10

## 2016-10-20 MED ORDER — SUCCINYLCHOLINE CHLORIDE 200 MG/10ML IV SOSY
PREFILLED_SYRINGE | INTRAVENOUS | Status: DC | PRN
  Administered 2016-10-20: 120 mg via INTRAVENOUS

## 2016-10-20 NOTE — Progress Notes (Signed)
Patient returned from specials.  Sister at bedside

## 2016-10-20 NOTE — Consult Note (Signed)
Carolyn Valdez   Reason for Valdez:  This is a follow-up Valdez for this 76 year old woman with depression Referring Physician:  Leslye Valdez Patient Identification: Carolyn Valdez:  [id number] Principal Diagnosis: Severe major depression, single episode, with psychotic features [facility]) Diagnosis:   Patient Active Problem List   Diagnosis Date Noted  . DNR (do not resuscitate) [Z66] 10/05/2016  . Palliative care by specialist [Z51.5] 10/05/2016  . Depression, major, recurrent, severe with psychosis ([place]) [F33.3] 10/05/2016  . Severe major depression, single episode, with psychotic features ([place]) [F32.3] 10/04/2016  . Cellulitis of leg, right [L03.115] 09/29/2016  . Proctitis [K62.89] 09/29/2016  . Hypercapnia [R06.89] 09/29/2016  . Acute urinary retention [R33.8] 09/29/2016  . UTI (urinary tract infection) [N39.0] 09/29/2016  . Weakness [R53.1] 09/29/2016  . Subacute delirium [F05] 09/28/2016  . Gastrointestinal hemorrhage [K92.2]   . Diffuse abdominal pain [R10.84]   . Altered mental status [R41.82] 09/24/2016  . Pressure injury of skin [L89.90] 09/02/2016  . Respiratory failure with hypoxia ([place]) [J96.91] 09/01/2016  . Lymphedema [I89.0] 08/16/2016  . CHF (congestive heart failure) ([place]) [I50.9] 08/09/2016  . Congestive heart failure ([place]) [I50.9] 11/25/2015  . Nocturia [R35.1] 11/05/2015  . Urinary frequency [R35.0] 11/05/2015  . Acute respiratory failure with hypoxia ([place]) [J96.01] 09/05/2015  . Recurrent UTI [N39.0] 04/20/2015  . Incontinence [R32] 04/20/2015  . Diabetes mellitus, type 2 ([place]) [E11.9] 04/17/2015  . ESBL (extended spectrum beta-lactamase) producing bacteria infection [A49.9, Z16.12] 04/17/2015  . BP (high blood pressure) [I10] 04/17/2015  . Frequent UTI [N39.0] 04/17/2015  . Absence of bladder continence [R32] 04/17/2015  . Iron deficiency anemia [D50.9] 03/08/2015  . Absolute anemia [D64.9] 09/25/2014  . Abdominal pain, lower [R10.30]  09/25/2014  . Urge incontinence [N39.41] 02/19/2013  . FOM (frequency of micturition) [R35.0] 02/19/2013  . Bladder infection, chronic [N30.20] 08/11/2012  . Difficult or painful urination [R30.0] 07/26/2012  . Lower urinary tract infection [N39.0] 12/31/2011  . Diabetes mellitus ([place]) [E11.9] 12/31/2011    Total Time spent with patient: 30 minutes  Subjective:   Carolyn Valdez is a 76 y.o. female patient admitted with patient made no comment.  HPI: Follow-up for January 3. Patient had her first ECT treatment this morning. Treatment went well without any complications. Prior to treatment this morning the patient was actually being quite verbal especially with the nursing staff and her sister. I have learned that she tends to be uniquely uncommunicative when I am involved for some reason.  Past Psychiatric History: Past history of depression possible bipolar disorder.  Risk to Self: Is patient at risk for suicide?: No Risk to Others:   Prior Inpatient Therapy:   Prior Outpatient Therapy:    Past Medical History:  Past Medical History:  Diagnosis Date  . Anxiety   . Breast cancer ([place])    16 yrs ago  . CHF (congestive heart failure) ([place])   . Diabetes mellitus without complication ([place])   . DJD (degenerative joint disease)   . Dysuria   . H/O total knee replacement    right  . HTN (hypertension)   . Hypercholesteremia   . Kidney stone   . Obesity   . Recurrent urinary tract infection   . SVT (supraventricular tachycardia) ([place])   . Urinary incontinence   . Vaginal atrophy   . Yeast vaginitis     Past Surgical History:  Procedure Laterality Date  . CARDIAC CATHETERIZATION    . CHOLECYSTECTOMY    . FOOT SURGERY    .  JOINT REPLACEMENT    . MASTECTOMY Right 1997  . NASAL SEPTUM SURGERY    . PERIPHERAL VASCULAR CATHETERIZATION N/A 07/08/2015   Procedure: PICC Line Insertion;  Surgeon: Algernon Huxley, MD;  Location: [facility];  Service: Cardiovascular;  Laterality:  N/A;  . TONSILLECTOMY    . Vocal cords     Family History:  Family History  Problem Relation Age of Onset  . Lung cancer Father   . Hematuria Mother   . Lung cancer Mother   . Kidney disease Neg Hx   . Bladder Cancer Neg Hx   . Breast cancer Neg Hx    Family Psychiatric  History: Some depression Social History:  History  Alcohol Use No     History  Drug Use No    Social History   Social History  . Marital status: Widowed    Spouse name: N/A  . Number of children: N/A  . Years of education: N/A   Social History Main Topics  . Smoking status: Former Research scientist (life sciences)  . Smokeless tobacco: Former Systems developer    Quit date: 10/29/1995  . Alcohol use No  . Drug use: No  . Sexual activity: No   Other Topics Concern  . None   Social History Narrative  . None   Additional Social History:    Allergies:   Allergies  Allergen Reactions  . Sulfa Antibiotics Hives  . Biaxin [Clarithromycin] Hives  . Influenza A (H1n1) Monoval Vac Other (See Comments)    Pt states that she was told by her MD not to get the influenza vaccine.    . Morphine Other (See Comments)    Reaction:  Dizziness and confusion   . Pyridium [Phenazopyridine Hcl] Other (See Comments)    Reaction:  Unknown   . Ceftriaxone Anxiety  . Latex Rash  . Prednisone Rash  . Tape Rash    Labs:  Results for orders placed or performed during the hospital encounter of 09/24/16 (from the past 48 hour(s))  Glucose, capillary     Status: Abnormal   Collection Time: 10/18/16  4:53 PM  Result Value Ref Range   Glucose-Capillary 208 (H) 65 - 99 mg/dL   Comment 1 Notify RN   Glucose, capillary     Status: Abnormal   Collection Time: 10/18/16  8:58 PM  Result Value Ref Range   Glucose-Capillary 212 (H) 65 - 99 mg/dL   Comment 1 Notify RN   Glucose, capillary     Status: Abnormal   Collection Time: 10/19/16  7:50 AM  Result Value Ref Range   Glucose-Capillary 214 (H) 65 - 99 mg/dL  Glucose, capillary     Status: Abnormal    Collection Time: 10/19/16 11:40 AM  Result Value Ref Range   Glucose-Capillary 267 (H) 65 - 99 mg/dL   Comment 1 Notify RN    Comment 2 Document in Chart   Glucose, capillary     Status: Abnormal   Collection Time: 10/19/16  4:56 PM  Result Value Ref Range   Glucose-Capillary 273 (H) 65 - 99 mg/dL  Basic metabolic panel     Status: Abnormal   Collection Time: 10/19/16  6:10 PM  Result Value Ref Range   Sodium 129 (L) 135 - 145 mmol/L   Potassium 5.1 3.5 - 5.1 mmol/L   Chloride 93 (L) 101 - 111 mmol/L   CO2 30 22 - 32 mmol/L   Glucose, Bld 289 (H) 65 - 99 mg/dL   BUN 38 (  H) 6 - 20 mg/dL   Creatinine, Ser 1.31 (H) 0.44 - 1.00 mg/dL   Calcium 10.2 8.9 - 10.3 mg/dL   GFR calc non Af Amer 39 (L) >60 mL/min   GFR calc Af Amer 46 (L) >60 mL/min    Comment: (NOTE) The eGFR has been calculated using the CKD EPI equation. This calculation has not been validated in all clinical situations. eGFR's persistently <60 mL/min signify possible Chronic Kidney Disease.    Anion gap 6 5 - 15  Glucose, capillary     Status: Abnormal   Collection Time: 10/19/16  9:04 PM  Result Value Ref Range   Glucose-Capillary 258 (H) 65 - 99 mg/dL  Glucose, capillary     Status: Abnormal   Collection Time: 10/19/16 11:40 PM  Result Value Ref Range   Glucose-Capillary 242 (H) 65 - 99 mg/dL  Creatinine, serum     Status: Abnormal   Collection Time: 10/20/16  5:05 AM  Result Value Ref Range   Creatinine, Ser 1.19 (H) 0.44 - 1.00 mg/dL   GFR calc non Af Amer 44 (L) >60 mL/min   GFR calc Af Amer 51 (L) >60 mL/min    Comment: (NOTE) The eGFR has been calculated using the CKD EPI equation. This calculation has not been validated in all clinical situations. eGFR's persistently <60 mL/min signify possible Chronic Kidney Disease.   Glucose, capillary     Status: Abnormal   Collection Time: 10/20/16  7:36 AM  Result Value Ref Range   Glucose-Capillary 282 (H) 65 - 99 mg/dL  Glucose, capillary     Status:  Abnormal   Collection Time: 10/20/16  9:20 AM  Result Value Ref Range   Glucose-Capillary 280 (H) 65 - 99 mg/dL   Comment 1 Notify RN     Current Facility-Administered Medications  Medication Dose Route Frequency Provider Last Rate Last Dose  . acetaminophen (TYLENOL) tablet 650 mg  650 mg Oral Q6H PRN Gladstone Lighter, MD   650 mg at 10/20/16 1310   Or  . acetaminophen (TYLENOL) suppository 650 mg  650 mg Rectal Q6H PRN Gladstone Lighter, MD      . alum & mag hydroxide-simeth (MAALOX/MYLANTA) 200-200-20 MG/5ML suspension 30 mL  30 mL Oral Q4H PRN Theodoro Grist, MD   30 mL at 09/25/16 2359  . amLODipine (NORVASC) tablet 10 mg  10 mg Oral Daily Theodoro Grist, MD   10 mg at 10/20/16 0848  . ARIPiprazole (ABILIFY) tablet 7.5 mg  7.5 mg Oral Daily Gonzella Lex, MD   7.5 mg at 10/20/16 1317  . atorvastatin (LIPITOR) tablet 20 mg  20 mg Oral QHS Dustin Flock, MD   20 mg at 10/19/16 2341  . citalopram (CELEXA) tablet 30 mg  30 mg Oral Daily Gonzella Lex, MD   30 mg at 10/20/16 1318  . clonazePAM (KLONOPIN) tablet 0.25 mg  0.25 mg Oral BID Gonzella Lex, MD   0.25 mg at 10/19/16 2342  . conjugated estrogens (PREMARIN) vaginal cream 1 Applicatorful  1 Applicatorful Vaginal Once per day on Mon Wed Fri Gladstone Lighter, MD   1 Applicatorful at 02/58/52 0854  . darifenacin (ENABLEX) 24 hr tablet 15 mg  15 mg Oral Daily Theodoro Grist, MD   15 mg at 10/20/16 1322  . desmopressin (DDAVP) tablet 0.2 mg  0.2 mg Oral QHS Gladstone Lighter, MD   0.2 mg at 10/19/16 2342  . docusate sodium (COLACE) capsule 100 mg  100 mg Oral BID Heloise Beecham  Alyse Low, RPH   100 mg at 10/19/16 2342  . enoxaparin (LOVENOX) injection 40 mg  40 mg Subcutaneous Q12H Gladstone Lighter, MD   40 mg at 10/20/16 1322  . feeding supplement (ENSURE ENLIVE) (ENSURE ENLIVE) liquid 237 mL  237 mL Oral TID BM Henreitta Leber, MD   237 mL at 10/20/16 1322  . furosemide (LASIX) tablet 20 mg  20 mg Oral Daily Dustin Flock, MD   20 mg at  10/20/16 1320  . insulin aspart (novoLOG) injection 0-15 Units  0-15 Units Subcutaneous TID WC Loletha Grayer, MD   5 Units at 10/20/16 1310  . insulin aspart (novoLOG) injection 0-5 Units  0-5 Units Subcutaneous QHS Gladstone Lighter, MD   2 Units at 10/20/16 0000  . insulin glargine (LANTUS) injection 58 Units  58 Units Subcutaneous QHS Dustin Flock, MD      . iron polysaccharides (NIFEREX) capsule 150 mg  150 mg Oral TID Gladstone Lighter, MD   150 mg at 10/20/16 1321  . loratadine (CLARITIN) tablet 10 mg  10 mg Oral Daily Gladstone Lighter, MD   10 mg at 10/20/16 1320  . metoCLOPramide (REGLAN) tablet 5 mg  5 mg Oral TID AC Theodoro Grist, MD   5 mg at 10/20/16 1319  . metoprolol succinate (TOPROL-XL) 24 hr tablet 50 mg  50 mg Oral BID Theodoro Grist, MD   50 mg at 10/20/16 0848  . nystatin (MYCOSTATIN/NYSTOP) topical powder   Topical TID Gladstone Lighter, MD      . ondansetron [facility]) tablet 4 mg  4 mg Oral Q6H PRN Gladstone Lighter, MD   4 mg at 09/29/16 1109   Or  . ondansetron (ZOFRAN) injection 4 mg  4 mg Intravenous Q6H PRN Gladstone Lighter, MD   4 mg at 09/27/16 0950  . pantoprazole (PROTONIX) EC tablet 40 mg  40 mg Oral BID Gladstone Lighter, MD   40 mg at 10/20/16 1322  . polyethylene glycol (MIRALAX / GLYCOLAX) packet 17 g  17 g Oral Daily Dustin Flock, MD   17 g at 10/20/16 1320  . polyvinyl alcohol (LIQUIFILM TEARS) 1.4 % ophthalmic solution 1 drop  1 drop Both Eyes PRN Theodoro Grist, MD      . potassium chloride SA (K-DUR,KLOR-CON) CR tablet 20 mEq  20 mEq Oral Daily Gladstone Lighter, MD   20 mEq at 10/20/16 1321  . senna (SENOKOT) tablet 8.6 mg  1 tablet Oral Daily Napoleon Form, RPH   8.6 mg at 10/19/16 1014  . traZODone (DESYREL) tablet 50 mg  50 mg Oral QHS Loletha Grayer, MD   50 mg at 10/19/16 2342  . vitamin E capsule 1,000 Units  1,000 Units Oral Daily Gladstone Lighter, MD   1,000 Units at 10/20/16 1321    Musculoskeletal: Strength & Muscle Tone:  decreased Gait & Station: unable to stand Patient leans: N/A  Psychiatric Specialty Exam: Physical Exam  Nursing note and vitals reviewed. Constitutional: She appears well-developed and well-nourished.  HENT:  Head: Normocephalic and atraumatic.  Eyes: Conjunctivae are normal. Pupils are equal, round, and reactive to light.  Neck: Normal range of motion.  Cardiovascular: Regular rhythm and normal heart sounds.   Respiratory: Effort normal. No respiratory distress.  GI: Soft.  Musculoskeletal: Normal range of motion.  Neurological: She is alert.  Skin: Skin is warm and dry.  Psychiatric: Her affect is inappropriate. Her speech is delayed. She is withdrawn. Cognition and memory are impaired. She is noncommunicative.    Review of Systems  Unable to perform ROS: Psychiatric disorder    Blood pressure (!) 114/51, pulse 78, temperature 97.8 F (36.6 C), temperature source Oral, resp. rate (!) 24, height 5' 7" (1.702 m), weight 115.2 kg (254 lb), SpO2 96 %.Body mass index is 39.78 kg/m.  General Appearance: Fairly Groomed  Eye Contact:  Minimal  Speech:  Negative  Volume:  Decreased  Mood:  Negative  Affect:  Negative  Thought Process:  NA  Orientation:  Negative  Thought Content:  Negative  Suicidal Thoughts:  Unknown  Homicidal Thoughts:  Unknown  Memory:  Negative  Judgement:  Negative  Insight:  Negative  Psychomotor Activity:  Negative  Concentration:  Concentration: Negative  Recall:  Negative  Fund of Knowledge:  Negative  Language:  Negative  Akathisia:  Negative  Handed:  Right  AIMS (if indicated):     Assets:  Social Support  ADL's:  Impaired  Cognition:  Impaired,  Severe  Sleep:        Treatment Plan Summary: Daily contact with patient to assess and evaluate symptoms and progress in treatment, Medication management and Plan Patient had ECT today which went fine. This afternoon when I came to see her she was sedated and was once again not speaking with me  although she did have her eyes open intermittently. Part of this is probably the postictal state and sedation from the anesthetic. I am going to increase her antipsychotic again as it continues to be reported to me that she has a lot of paranoid ideation. I will continue to follow-up and we are hopeful that we can continue treatment with another one scheduled for Friday.  Disposition: Recommend psychiatric Inpatient admission when medically cleared. Supportive therapy provided about ongoing stressors.  Alethia Berthold, MD 10/20/2016 3:28 PM

## 2016-10-20 NOTE — Progress Notes (Signed)
Patient ID: Carolyn Valdez, female   DOB: Jun 26, 1941, 76 y.o.   MRN: [id number]   [facility] PROGRESS NOTE  EVAGELIA KNACK QIW:[id number] DOB: 29-Oct-1940 DOA: 09/24/2016 PCP: Leonel Ramsay, MD  HPI/Subjective: Patient will be receiving ECT therapylater today No new changes noted   objective  Vitals:   10/20/16 1230 10/20/16 1248  BP: (!) 161/63 (!) 114/51  Pulse:  78  Resp:  (!) 24  Temp:  97.8 F (36.6 C)    Filed Weights   09/24/16 1159 10/14/16 1432 10/20/16 0954  Weight: 290 lb (131.5 kg) 252 lb 12.8 oz (114.7 kg) 254 lb (115.2 kg)    ROS: Review of Systems  Unable to perform ROS: Acuity of condition  Respiratory: Negative for shortness of breath.   Cardiovascular: Negative for chest pain.  Gastrointestinal: Negative for abdominal pain.   Exam: Physical Exam  HENT:  Nose: No mucosal edema.  Mouth/Throat: No oropharyngeal exudate or posterior oropharyngeal edema.  Eyes: Conjunctivae, EOM and lids are normal. Pupils are equal, round, and reactive to light.  Neck: No JVD present. Carotid bruit is not present. No edema present. No thyroid mass and no thyromegaly present.  Cardiovascular: S1 normal and S2 normal.  Exam reveals no gallop.   Murmur heard.  Systolic murmur is present with a grade of 2/6  Pulses:      Dorsalis pedis pulses are 2+ on the right side, and 2+ on the left side.  Respiratory: No respiratory distress. She has no decreased breath sounds. She has no wheezes. She has no rhonchi. She has no rales.  GI: Soft. Bowel sounds are normal. There is no tenderness.  Musculoskeletal:       Right ankle: She exhibits swelling.       Left ankle: She exhibits swelling.  Lymphadenopathy:    She has no cervical adenopathy.  Neurological: She is alert. No cranial nerve deficit.  Skin: Skin is warm. No rash noted. Nails show no clubbing.  Psychiatric: Her affect is blunt. She is slowed.      Data Reviewed: Basic Metabolic Panel:  Recent Labs Lab  10/16/16 0520 10/19/16 1810 10/20/16 0505  NA  --  129*  --   K  --  5.1  --   CL  --  93*  --   CO2  --  30  --   GLUCOSE  --  289*  --   BUN  --  38*  --   CREATININE 0.78 1.31* 1.19*  CALCIUM  --  10.2  --    BNP (last 3 results)  Recent Labs  02/23/16 1617 08/09/16 1550 08/31/16 2145  BNP 181.0* 311.0* 163.0*     CBG:  Recent Labs Lab 10/19/16 1656 10/19/16 2104 10/19/16 2340 10/20/16 0736 10/20/16 0920  GLUCAP 273* 258* 242* 282* 280*     Scheduled Meds: . amLODipine  10 mg Oral Daily  . ARIPiprazole  7.5 mg Oral Daily  . atorvastatin  20 mg Oral QHS  . citalopram  30 mg Oral Daily  . clonazePAM  0.25 mg Oral BID  . conjugated estrogens  1 Applicatorful Vaginal Once per day on Mon Wed Fri  . darifenacin  15 mg Oral Daily  . desmopressin  0.2 mg Oral QHS  . docusate sodium  100 mg Oral BID  . enoxaparin (LOVENOX) injection  40 mg Subcutaneous Q12H  . feeding supplement (ENSURE ENLIVE)  237 mL Oral TID BM  . furosemide  20 mg Oral Daily  .  insulin aspart  0-15 Units Subcutaneous TID WC  . insulin aspart  0-5 Units Subcutaneous QHS  . insulin glargine  52 Units Subcutaneous QHS  . iron polysaccharides  150 mg Oral TID  . loratadine  10 mg Oral Daily  . metoCLOPramide  5 mg Oral TID AC  . metoprolol succinate  50 mg Oral BID  . nystatin   Topical TID  . pantoprazole  40 mg Oral BID  . polyethylene glycol  17 g Oral Daily  . potassium chloride SA  20 mEq Oral Daily  . senna  1 tablet Oral Daily  . traZODone  50 mg Oral QHS  . vitamin E  1,000 Units Oral Daily    Assessment/Plan:  1. 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   ECT therapy today 2. Type 2 diabetes- increase lantus bg still elevated  3. Hyperlipidemia unspecified. continue atorvastatin 4. Essential hypertension on Norvasc and Toprol 5. GERD on Protonix 6. History of diabetic gastroparesis on Reglan 7. History of diastolic congestive heart failure and lower extremity  edema.resume lasix 8. Weakness. Unable to do much with physical therapy 9. Failure to thrive.  Nutrition improved   Code Status:     Code Status Orders        Start     Ordered   10/05/16 1217  Do not attempt resuscitation (DNR)  Continuous    Question Answer Comment  In the event of cardiac or respiratory ARREST Do not call a "code blue"   In the event of cardiac or respiratory ARREST Do not perform Intubation, CPR, defibrillation or ACLS   In the event of cardiac or respiratory ARREST Use medication by any route, position, wound care, and other measures to relive pain and suffering. May use oxygen, suction and manual treatment of airway obstruction as needed for comfort.      10/05/16 1216    Code Status History    Date Active Date Inactive Code Status Order ID Comments User Context   09/24/2016  5:31 PM 10/05/2016 12:16 PM Full Code [id number]  Gladstone Lighter, MD Inpatient   09/01/2016  2:17 AM 09/05/2016  4:28 PM Full Code [id number]  Harvie Bridge, DO Inpatient   08/09/2016  8:27 PM 08/12/2016  8:15 PM Full Code [id number]  Henreitta Leber, MD Inpatient   09/30/2015  2:23 PM 10/07/2015  5:37 PM Full Code [id number]  Loletha Grayer, MD ED   09/05/2015  5:56 PM 09/09/2015  5:38 PM DNR [id number]  Loletha Grayer, MD ED    Advance Directive Documentation   [place] Most Recent Value  Type of Advance Directive  Living will  Pre-existing out of facility DNR order (yellow form or pink MOST form)  No data  "MOST" Form in Place?  No data     Disposition Plan:As per psychiatry no today states that patient will need inpatient psychiatric Consultants:  Psychiatry  Time spent: 28 minutes  [place], [facility]

## 2016-10-20 NOTE — Anesthesia Postprocedure Evaluation (Signed)
Anesthesia Post Note  Patient: Carolyn Valdez  Procedure(s) Performed: * No procedures listed *  Patient location during evaluation: PACU Anesthesia Type: General Level of consciousness: awake and alert Pain management: pain level controlled Vital Signs Assessment: post-procedure vital signs reviewed and stable Respiratory status: spontaneous breathing, nonlabored ventilation, respiratory function stable and patient connected to nasal cannula oxygen Cardiovascular status: blood pressure returned to baseline and stable Postop Assessment: no signs of nausea or vomiting Anesthetic complications: no     Last Vitals:  Vitals:   10/20/16 1230 10/20/16 1248  BP: (!) 161/63 (!) 114/51  Pulse:  78  Resp:  (!) 24  Temp:  36.6 C    Last Pain:  Vitals:   10/20/16 1248  TempSrc: Oral  PainSc:                  [name]

## 2016-10-20 NOTE — Procedures (Signed)
ECT SERVICES Physician's Interval Evaluation & Treatment Note  Patient Identification: Carolyn Valdez MRN:  [id number] Date of Evaluation:  10/20/2016 TX #: 1  MADRS: 18  MMSE: 30  P.E. Findings:  Vital stable. Heart normal exam. Lungs clear. General weakness.  Psychiatric Interval Note:  Withdrawn minimally verbal flat affect confused  Subjective:  Patient is a 76 y.o. female seen for evaluation for Electroconvulsive Therapy. No specific subjective response to maintain  Treatment Summary:   [x]   Right Unilateral             []  Bilateral   % Energy : 0.3 ms 80%   Impedance: 1040 ohms  Seizure Energy Index: 1861 V squared  Postictal Suppression Index: 31%  Seizure Concordance Index: 51%  Medications  Pre Shock: 100 mg of Brevital 120 mg succinylcholine  Post Shock:    Seizure Duration: 19 seconds by EMG, 72 seconds by EEG. I would've probably read longer but the end is a little indistinct   Comments: Follow-up Friday   Lungs:  [x]   Clear to auscultation               []  Other:   Heart:    [x]   Regular rhythm             []  irregular rhythm    [x]   Previous H&P reviewed, patient examined and there are NO CHANGES                 []   Previous H&P reviewed, patient examined and there are changes noted.   Alethia Berthold, MD 1/3/201811:29 AM

## 2016-10-20 NOTE — H&P (Signed)
Carolyn Valdez is an 76 y.o. female.   Chief Complaint: Patient with no specific chief complaint HPI: 76 year old woman with near catatonia at times very mentally shut down and withdrawn.  Past Medical History:  Diagnosis Date  . Anxiety   . Breast cancer ([place])    16 yrs ago  . CHF (congestive heart failure) ([place])   . Diabetes mellitus without complication ([place])   . DJD (degenerative joint disease)   . Dysuria   . H/O total knee replacement    right  . HTN (hypertension)   . Hypercholesteremia   . Kidney stone   . Obesity   . Recurrent urinary tract infection   . SVT (supraventricular tachycardia) ([place])   . Urinary incontinence   . Vaginal atrophy   . Yeast vaginitis     Past Surgical History:  Procedure Laterality Date  . CARDIAC CATHETERIZATION    . CHOLECYSTECTOMY    . FOOT SURGERY    . JOINT REPLACEMENT    . MASTECTOMY Right 1997  . NASAL SEPTUM SURGERY    . PERIPHERAL VASCULAR CATHETERIZATION N/A 07/08/2015   Procedure: PICC Line Insertion;  Surgeon: Algernon Huxley, MD;  Location: [facility];  Service: Cardiovascular;  Laterality: N/A;  . TONSILLECTOMY    . Vocal cords      Family History  Problem Relation Age of Onset  . Lung cancer Father   . Hematuria Mother   . Lung cancer Mother   . Kidney disease Neg Hx   . Bladder Cancer Neg Hx   . Breast cancer Neg Hx    Social History:  reports that she has quit smoking. She quit smokeless tobacco use about 20 years ago. She reports that she does not drink alcohol or use drugs.  Allergies:  Allergies  Allergen Reactions  . Sulfa Antibiotics Hives  . Biaxin [Clarithromycin] Hives  . Influenza A (H1n1) Monoval Vac Other (See Comments)    Pt states that she was told by her MD not to get the influenza vaccine.    . Morphine Other (See Comments)    Reaction:  Dizziness and confusion   . Pyridium [Phenazopyridine Hcl] Other (See Comments)    Reaction:  Unknown   . Ceftriaxone Anxiety  . Latex Rash  .  Prednisone Rash  . Tape Rash    Medications Prior to Admission  Medication Sig Dispense Refill  . amLODipine (NORVASC) 2.5 MG tablet Take 1 tablet (2.5 mg total) by mouth daily. 30 tablet 0  . atorvastatin (LIPITOR) 20 MG tablet Take 20 mg by mouth at bedtime.    . clonazePAM (KLONOPIN) 1 MG tablet Take 1 mg by mouth 3 (three) times daily.    Marland Kitchen conjugated estrogens (PREMARIN) vaginal cream Place 1 Applicatorful vaginally 3 (three) times a week. Pt uses on Monday, Wednesday, and Friday.    . desmopressin (DDAVP) 0.2 MG tablet Take 1 tablet (0.2 mg total) by mouth at bedtime. 90 tablet 3  . docusate sodium (COLACE) 100 MG capsule Take 200 mg by mouth 2 (two) times daily.    . fexofenadine (ALLEGRA) 180 MG tablet Take 180 mg by mouth daily.    . furosemide (LASIX) 20 MG tablet Take 1 tablet (20 mg total) by mouth daily. 30 tablet 0  . gabapentin (NEURONTIN) 300 MG capsule Take 300-600 mg by mouth 3 (three) times daily. Pt takes two capsules in the morning, one capsule at lunch, and two capsules in the evening.    . hydrOXYzine (  ATARAX/VISTARIL) 25 MG tablet Take 25 mg by mouth at bedtime.     . insulin aspart (NOVOLOG) 100 UNIT/ML injection Inject 0-20 Units into the skin 3 (three) times daily with meals. 10 mL 11  . insulin aspart (NOVOLOG) 100 UNIT/ML injection Inject 0-5 Units into the skin at bedtime. 10 mL 11  . insulin glargine (LANTUS) 100 UNIT/ML injection Inject 50 Units into the skin at bedtime.    . iron polysaccharides (NIFEREX) 150 MG capsule Take 150 mg by mouth 3 (three) times daily.     . metoprolol succinate (TOPROL-XL) 25 MG 24 hr tablet Take 1 tablet (25 mg total) by mouth 2 (two) times daily. 30 tablet 0  . nystatin (MYCOSTATIN/NYSTOP) powder Apply topically 3 (three) times daily. 15 g 0  . omeprazole (PRILOSEC) 40 MG capsule Take 40 mg by mouth 2 (two) times daily.    . ondansetron (ZOFRAN ODT) 4 MG disintegrating tablet Take 1 tablet (4 mg total) by mouth every 8 (eight)  hours as needed for nausea or vomiting. 20 tablet 0  . pioglitazone (ACTOS) 30 MG tablet Take 30 mg by mouth daily.    . potassium chloride SA (K-DUR,KLOR-CON) 20 MEQ tablet Take 1 tablet (20 mEq total) by mouth daily. 30 tablet 0  . VESICARE 10 MG tablet Take 1 tablet (10 mg total) by mouth daily. 90 tablet 3  . vitamin E 1000 UNIT capsule Take 1,000 Units by mouth daily.      Results for orders placed or performed during the hospital encounter of 09/24/16 (from the past 48 hour(s))  Glucose, capillary     Status: Abnormal   Collection Time: 10/18/16 12:21 PM  Result Value Ref Range   Glucose-Capillary 332 (H) 65 - 99 mg/dL  Glucose, capillary     Status: Abnormal   Collection Time: 10/18/16  4:53 PM  Result Value Ref Range   Glucose-Capillary 208 (H) 65 - 99 mg/dL   Comment 1 Notify RN   Glucose, capillary     Status: Abnormal   Collection Time: 10/18/16  8:58 PM  Result Value Ref Range   Glucose-Capillary 212 (H) 65 - 99 mg/dL   Comment 1 Notify RN   Glucose, capillary     Status: Abnormal   Collection Time: 10/19/16  7:50 AM  Result Value Ref Range   Glucose-Capillary 214 (H) 65 - 99 mg/dL  Glucose, capillary     Status: Abnormal   Collection Time: 10/19/16 11:40 AM  Result Value Ref Range   Glucose-Capillary 267 (H) 65 - 99 mg/dL   Comment 1 Notify RN    Comment 2 Document in Chart   Glucose, capillary     Status: Abnormal   Collection Time: 10/19/16  4:56 PM  Result Value Ref Range   Glucose-Capillary 273 (H) 65 - 99 mg/dL  Basic metabolic panel     Status: Abnormal   Collection Time: 10/19/16  6:10 PM  Result Value Ref Range   Sodium 129 (L) 135 - 145 mmol/L   Potassium 5.1 3.5 - 5.1 mmol/L   Chloride 93 (L) 101 - 111 mmol/L   CO2 30 22 - 32 mmol/L   Glucose, Bld 289 (H) 65 - 99 mg/dL   BUN 38 (H) 6 - 20 mg/dL   Creatinine, Ser 1.31 (H) 0.44 - 1.00 mg/dL   Calcium 10.2 8.9 - 10.3 mg/dL   GFR calc non Af Amer 39 (L) >60 mL/min   GFR calc Af Amer 46 (L) >60 mL/min  Comment: (NOTE) The eGFR has been calculated using the CKD EPI equation. This calculation has not been validated in all clinical situations. eGFR's persistently <60 mL/min signify possible Chronic Kidney Disease.    Anion gap 6 5 - 15  Glucose, capillary     Status: Abnormal   Collection Time: 10/19/16  9:04 PM  Result Value Ref Range   Glucose-Capillary 258 (H) 65 - 99 mg/dL  Glucose, capillary     Status: Abnormal   Collection Time: 10/19/16 11:40 PM  Result Value Ref Range   Glucose-Capillary 242 (H) 65 - 99 mg/dL  Creatinine, serum     Status: Abnormal   Collection Time: 10/20/16  5:05 AM  Result Value Ref Range   Creatinine, Ser 1.19 (H) 0.44 - 1.00 mg/dL   GFR calc non Af Amer 44 (L) >60 mL/min   GFR calc Af Amer 51 (L) >60 mL/min    Comment: (NOTE) The eGFR has been calculated using the CKD EPI equation. This calculation has not been validated in all clinical situations. eGFR's persistently <60 mL/min signify possible Chronic Kidney Disease.   Glucose, capillary     Status: Abnormal   Collection Time: 10/20/16  7:36 AM  Result Value Ref Range   Glucose-Capillary 282 (H) 65 - 99 mg/dL  Glucose, capillary     Status: Abnormal   Collection Time: 10/20/16  9:20 AM  Result Value Ref Range   Glucose-Capillary 280 (H) 65 - 99 mg/dL   Comment 1 Notify RN    No results found.  Review of Systems  Constitutional: Positive for malaise/fatigue.  HENT: Negative.   Eyes: Negative.   Respiratory: Negative.   Cardiovascular: Negative.   Gastrointestinal: Negative.   Musculoskeletal: Negative.   Skin: Negative.   Neurological: Positive for weakness.  Psychiatric/Behavioral: Negative.     Blood pressure (!) 161/60, pulse 79, temperature 98.6 F (37 C), temperature source Oral, resp. rate 16, height 5' 7" (1.702 m), weight 115.2 kg (254 lb), SpO2 97 %. Physical Exam  Nursing note and vitals reviewed. Constitutional: She appears well-developed and well-nourished.  HENT:   Head: Normocephalic and atraumatic.  Eyes: Conjunctivae are normal. Pupils are equal, round, and reactive to light.  Neck: Normal range of motion.  Cardiovascular: Regular rhythm and normal heart sounds.   Respiratory: Effort normal. No respiratory distress.  GI: Soft.  Musculoskeletal: Normal range of motion.       Legs: Neurological: She is alert.  Skin: Skin is warm and dry.  Psychiatric: Her affect is blunt. Her speech is delayed. She is slowed and withdrawn. Thought content is paranoid. She expresses inappropriate judgment. She expresses no homicidal and no suicidal ideation. She exhibits abnormal recent memory.     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

## 2016-10-20 NOTE — Progress Notes (Signed)
Patient left for Specials for her ECT.  She insisted on taking her glasses and would not leave them in the room

## 2016-10-20 NOTE — Anesthesia Preprocedure Evaluation (Signed)
Anesthesia Evaluation  Patient identified by MRN, date of birth, ID band Patient awake    Reviewed: Allergy & Precautions, H&P , NPO status , Patient's Chart, lab work & pertinent test results  History of Anesthesia Complications Negative for: history of anesthetic complications  Airway Mallampati: III  TM Distance: <3 FB Neck ROM: limited    Dental  (+) Poor Dentition, Chipped   Pulmonary neg shortness of breath, former smoker,    Pulmonary exam normal breath sounds clear to auscultation       Cardiovascular Exercise Tolerance: Poor hypertension, (-) angina+CHF  (-) Past MI and (-) DOE Normal cardiovascular exam Rhythm:regular Rate:Normal     Neuro/Psych PSYCHIATRIC DISORDERS negative neurological ROS     GI/Hepatic negative GI ROS, Neg liver ROS,   Endo/Other  diabetes, Type 2  Renal/GU Renal disease  negative genitourinary   Musculoskeletal  (+) Arthritis ,   Abdominal   Peds  Hematology   Anesthesia Other Findings Past Medical History: No date: Anxiety No date: Breast cancer ([place])     Comment: 16 yrs ago No date: CHF (congestive heart failure) ([facility]) No date: Diabetes mellitus without complication ([facility]) No date: DJD (degenerative joint disease) No date: Dysuria No date: H/O total knee replacement     Comment: right No date: HTN (hypertension) No date: Hypercholesteremia No date: Kidney stone No date: Obesity No date: Recurrent urinary tract infection No date: SVT (supraventricular tachycardia) ([facility]) No date: Urinary incontinence No date: Vaginal atrophy No date: Yeast vaginitis  Past Surgical History: No date: CARDIAC CATHETERIZATION No date: CHOLECYSTECTOMY No date: FOOT SURGERY No date: JOINT REPLACEMENT 1997: MASTECTOMY Right No date: NASAL SEPTUM SURGERY 07/08/2015: PERIPHERAL VASCULAR CATHETERIZATION N/A     Comment: Procedure: PICC Line Insertion;  Surgeon:               Algernon Huxley,  MD;  Location: [facility];  Service: Cardiovascular;  Laterality:               N/A; No date: TONSILLECTOMY No date: Vocal cords  BMI    Body Mass Index:  39.78 kg/m      Reproductive/Obstetrics negative OB ROS                             Anesthesia Physical Anesthesia Plan  ASA: III  Anesthesia Plan: General   Post-op Pain Management:    Induction:   Airway Management Planned:   Additional Equipment:   Intra-op Plan:   Post-operative Plan:   Informed Consent: I have reviewed the patients History and Physical, chart, labs and discussed the procedure including the risks, benefits and alternatives for the proposed anesthesia with the patient or authorized representative who has indicated his/her understanding and acceptance.   Dental Advisory Given  Plan Discussed with: Anesthesiologist, CRNA and Surgeon  Anesthesia Plan Comments:         Anesthesia Quick Evaluation

## 2016-10-20 NOTE — Anesthesia Procedure Notes (Signed)
Performed by: Dionne Bucy Pre-anesthesia Checklist: Patient identified, Emergency Drugs available, Suction available and Patient being monitored Patient Re-evaluated:Patient Re-evaluated prior to inductionOxygen Delivery Method: Circle system utilized Preoxygenation: Pre-oxygenation with 100% oxygen Intubation Type: IV induction Ventilation: Mask ventilation without difficulty and Mask ventilation throughout procedure Airway Equipment and Method: Bite block Placement Confirmation: positive ETCO2 Dental Injury: Teeth and Oropharynx as per pre-operative assessment

## 2016-10-20 NOTE — Progress Notes (Addendum)
Inpatient Diabetes Program Recommendations  AACE/ADA: New Consensus Statement on Inpatient Glycemic Control (2015)  Target Ranges:  Prepandial:   less than 140 mg/dL      Peak postprandial:   less than 180 mg/dL (1-2 hours)      Critically ill patients:  140 - 180 mg/dL   Results for Carolyn Valdez, Carolyn Valdez (MRN [id number]) as of 10/20/2016 10:13  Ref. Range 10/19/2016 07:50 10/19/2016 11:40 10/19/2016 16:56 10/19/2016 21:04 10/19/2016 23:40  Glucose-Capillary Latest Ref Range: 65 - 99 mg/dL 214 (H) 267 (H) 273 (H) 258 (H) 242 (H)   Results for Carolyn Valdez, Carolyn Valdez (MRN [id number]) as of 10/20/2016 10:13  Ref. Range 10/20/2016 07:36  Glucose-Capillary Latest Ref Range: 65 - 99 mg/dL 282 (H)    Home DM Meds: Lantus 50 units QHS                             Novolog 0-20 TID                             Actos 30 mg daily  Current Insulin Orders: Lantus 52 units QHS                                       Novolog Moderate Correction Scale/ SSI (0-15 units) TID AC + HS                                              MD- Note Lantus increased last PM and Novolog 3 units TID (meal coverage) stopped.    Note that MD would like to simplify home insulin/diabetes medication regimen due to complicated disposition at time of d/c.  MD- Please consider the following:  1. Fasting glucose still elevated- Please consider increasing Lantus further to 60 units QHS  2. Start DPP-4 inhibitor to help with post-meal glucose elevations (since Novolog meal coverage stopped).  Could try Tradjenta 5 mg daily.      --Will follow patient during hospitalization--  Wyn Quaker RN, MSN, CDE Diabetes Coordinator Inpatient Glycemic Control Team Team Pager: [phone] (8a-5p)

## 2016-10-20 NOTE — Transfer of Care (Signed)
Immediate Anesthesia Transfer of Care Note  Patient: Carolyn Valdez  Procedure(s) Performed: ECT  Patient Location: PACU  Anesthesia Type:General  Level of Consciousness: sedated  Airway & Oxygen Therapy: Patient Spontanous Breathing and Patient connected to face mask oxygen  Post-op Assessment: Report given to RN and Post -op Vital signs reviewed and stable  Post vital signs: Reviewed and stable  Last Vitals:  Vitals:   10/20/16 0847 10/20/16 0954  BP: 138/77 (!) 161/60  Pulse: 79 79  Resp: 16   Temp: 36.7 C 37 C    Last Pain:  Vitals:   10/20/16 0954  TempSrc: Oral  PainSc:       Patients Stated Pain Goal: 0 (57/84/69 [id number])  Complications: No apparent anesthesia complications

## 2016-10-21 ENCOUNTER — Inpatient Hospital Stay: Payer: Medicare Other

## 2016-10-21 LAB — URINALYSIS, ROUTINE W REFLEX MICROSCOPIC
BILIRUBIN URINE: NEGATIVE
Glucose, UA: 100 mg/dL — AB
Nitrite: POSITIVE — AB
Protein, ur: >300 mg/dL — AB
SPECIFIC GRAVITY, URINE: 1.02 (ref 1.005–1.030)
pH: 6.5 (ref 5.0–8.0)

## 2016-10-21 LAB — CBC
HCT: 37.4 % (ref 35.0–47.0)
HEMOGLOBIN: 12.8 g/dL (ref 12.0–16.0)
MCH: 27.8 pg (ref 26.0–34.0)
MCHC: 34.2 g/dL (ref 32.0–36.0)
MCV: 81.4 fL (ref 80.0–100.0)
PLATELETS: 361 10*3/uL (ref 150–440)
RBC: 4.6 MIL/uL (ref 3.80–5.20)
RDW: 16.5 % — ABNORMAL HIGH (ref 11.5–14.5)
WBC: 11.4 10*3/uL — AB (ref 3.6–11.0)

## 2016-10-21 LAB — GLUCOSE, CAPILLARY
GLUCOSE-CAPILLARY: 224 mg/dL — AB (ref 65–99)
GLUCOSE-CAPILLARY: 219 mg/dL — AB (ref 65–99)
Glucose-Capillary: 280 mg/dL — ABNORMAL HIGH (ref 65–99)
Glucose-Capillary: 277 mg/dL — ABNORMAL HIGH (ref 65–99)
Glucose-Capillary: 245 mg/dL — ABNORMAL HIGH (ref 65–99)
Glucose-Capillary: 238 mg/dL — ABNORMAL HIGH (ref 65–99)

## 2016-10-21 LAB — URINALYSIS, MICROSCOPIC (REFLEX)

## 2016-10-21 LAB — BLOOD GAS, ARTERIAL
ACID-BASE EXCESS: 7.4 mmol/L — AB (ref 0.0–2.0)
Bicarbonate: 32 mmol/L — ABNORMAL HIGH (ref 20.0–28.0)
FIO2: 0.28
O2 Saturation: 96.7 %
PH ART: 7.47 — AB (ref 7.350–7.450)
Patient temperature: 37
pCO2 arterial: 44 mmHg (ref 32.0–48.0)
pO2, Arterial: 82 mmHg — ABNORMAL LOW (ref 83.0–108.0)

## 2016-10-21 LAB — BASIC METABOLIC PANEL
ANION GAP: 10 (ref 5–15)
BUN: 28 mg/dL — ABNORMAL HIGH (ref 6–20)
CHLORIDE: 90 mmol/L — AB (ref 101–111)
CO2: 31 mmol/L (ref 22–32)
Calcium: 10.3 mg/dL (ref 8.9–10.3)
Creatinine, Ser: 1.07 mg/dL — ABNORMAL HIGH (ref 0.44–1.00)
GFR calc non Af Amer: 50 mL/min — ABNORMAL LOW (ref >60)
GFR, EST AFRICAN AMERICAN: 58 mL/min — AB (ref >60)
Glucose, Bld: 304 mg/dL — ABNORMAL HIGH (ref 65–99)
POTASSIUM: 4.2 mmol/L (ref 3.5–5.1)
SODIUM: 131 mmol/L — AB (ref 135–145)

## 2016-10-21 MED ORDER — ENOXAPARIN SODIUM 40 MG/0.4ML ~~LOC~~ SOLN
40.0000 mg | SUBCUTANEOUS | Status: DC
  Administered 2016-10-22 – 2016-10-25 (×4): 40 mg via SUBCUTANEOUS
  Filled 2016-10-21 (×4): qty 0.4

## 2016-10-21 MED ORDER — INSULIN GLARGINE 100 UNIT/ML ~~LOC~~ SOLN
65.0000 [IU] | Freq: Every day | SUBCUTANEOUS | Status: DC
  Administered 2016-10-21: 65 [IU] via SUBCUTANEOUS
  Filled 2016-10-21 (×2): qty 0.65

## 2016-10-21 MED ORDER — VANCOMYCIN HCL 10 G IV SOLR
1250.0000 mg | Freq: Once | INTRAVENOUS | Status: AC
  Administered 2016-10-21: 1250 mg via INTRAVENOUS
  Filled 2016-10-21: qty 1250

## 2016-10-21 MED ORDER — LORAZEPAM 2 MG/ML IJ SOLN
INTRAMUSCULAR | Status: AC
  Administered 2016-10-21: 2 mg
  Filled 2016-10-21: qty 1

## 2016-10-21 MED ORDER — SODIUM CHLORIDE 0.9 % IV SOLN
1250.0000 mg | INTRAVENOUS | Status: DC
  Administered 2016-10-21: 1250 mg via INTRAVENOUS
  Filled 2016-10-21 (×2): qty 1250

## 2016-10-21 MED ORDER — SODIUM CHLORIDE 0.9 % IV SOLN
1000.0000 mg | Freq: Two times a day (BID) | INTRAVENOUS | Status: DC
  Administered 2016-10-21 (×2): 1000 mg via INTRAVENOUS
  Filled 2016-10-21 (×4): qty 10

## 2016-10-21 MED ORDER — MEROPENEM-SODIUM CHLORIDE 1 GM/50ML IV SOLR
1.0000 g | Freq: Three times a day (TID) | INTRAVENOUS | Status: DC
  Administered 2016-10-21 – 2016-10-25 (×12): 1 g via INTRAVENOUS
  Filled 2016-10-21 (×15): qty 50

## 2016-10-21 MED ORDER — ACETAMINOPHEN 10 MG/ML IV SOLN
1000.0000 mg | Freq: Once | INTRAVENOUS | Status: AC
  Administered 2016-10-21: 1000 mg via INTRAVENOUS
  Filled 2016-10-21: qty 100

## 2016-10-21 NOTE — Consult Note (Signed)
[facility] Consult   Reason for Consult:  This is a follow-up consult for this 76 year old woman with depression Referring Physician:  Leslye Peer Patient Identification: [name] MRN:  [id number] Principal Diagnosis: Severe major depression, single episode, with psychotic features [facility]) Diagnosis:   Patient Active Problem List   Diagnosis Date Noted  . DNR (do not resuscitate) [Z66] 10/05/2016  . Palliative care by specialist [Z51.5] 10/05/2016  . Depression, major, recurrent, severe with psychosis ([place]) [F33.3] 10/05/2016  . Severe major depression, single episode, with psychotic features ([place]) [F32.3] 10/04/2016  . Cellulitis of leg, right [L03.115] 09/29/2016  . Proctitis [K62.89] 09/29/2016  . Hypercapnia [R06.89] 09/29/2016  . Acute urinary retention [R33.8] 09/29/2016  . UTI (urinary tract infection) [N39.0] 09/29/2016  . Weakness [R53.1] 09/29/2016  . Subacute delirium [F05] 09/28/2016  . Gastrointestinal hemorrhage [K92.2]   . Diffuse abdominal pain [R10.84]   . Altered mental status [R41.82] 09/24/2016  . Pressure injury of skin [L89.90] 09/02/2016  . Respiratory failure with hypoxia ([place]) [J96.91] 09/01/2016  . Lymphedema [I89.0] 08/16/2016  . CHF (congestive heart failure) ([place]) [I50.9] 08/09/2016  . Congestive heart failure ([place]) [I50.9] 11/25/2015  . Nocturia [R35.1] 11/05/2015  . Urinary frequency [R35.0] 11/05/2015  . Acute respiratory failure with hypoxia ([place]) [J96.01] 09/05/2015  . Recurrent UTI [N39.0] 04/20/2015  . Incontinence [R32] 04/20/2015  . Diabetes mellitus, type 2 ([place]) [E11.9] 04/17/2015  . ESBL (extended spectrum beta-lactamase) producing bacteria infection [A49.9, Z16.12] 04/17/2015  . BP (high blood pressure) [I10] 04/17/2015  . Frequent UTI [N39.0] 04/17/2015  . Absence of bladder continence [R32] 04/17/2015  . Iron deficiency anemia [D50.9] 03/08/2015  . Absolute anemia [D64.9] 09/25/2014  . Abdominal pain, lower [R10.30]  09/25/2014  . Urge incontinence [N39.41] 02/19/2013  . FOM (frequency of micturition) [R35.0] 02/19/2013  . Bladder infection, chronic [N30.20] 08/11/2012  . Difficult or painful urination [R30.0] 07/26/2012  . Lower urinary tract infection [N39.0] 12/31/2011  . Diabetes mellitus ([place]) [E11.9] 12/31/2011    Total Time spent with patient: 20 minutes  Subjective:   Carolyn Valdez is a 76 y.o. female patient admitted with patient made no comment.  HPI: Follow-up for January 3. Patient had her first ECT treatment this morning. Treatment went well without any complications. Prior to treatment this morning the patient was actually being quite verbal especially with the nursing staff and her sister. I have learned that she tends to be uniquely uncommunicative when I am involved for some reason.  Follow-up for Thursday the fourth. Patient unfortunately today has taken a turn for the worse medically. She has been much less responsive today. When I came to see her this afternoon she briefly seemed to be able to speak but then became less responsive. Her sister think she may have spoken a little bit to her. Patient's fever is up at 102 and her urine is grossly infected. Patient was shaking when I came by but it is not seizure-like at all. I doubt very much that she had a seizure. There is no reason to think that what happened. Her head CT is normal. I would probably not suggest continuing antiseizure medicine.  Past Psychiatric History: Past history of depression possible bipolar disorder.  Risk to Self: Is patient at risk for suicide?: No Risk to Others:   Prior Inpatient Therapy:   Prior Outpatient Therapy:    Past Medical History:  Past Medical History:  Diagnosis Date  . Anxiety   . Breast cancer ([place])    16  yrs ago  . CHF (congestive heart failure) ([place])   . Diabetes mellitus without complication ([place])   . DJD (degenerative joint disease)   . Dysuria   . H/O total knee replacement    right   . HTN (hypertension)   . Hypercholesteremia   . Kidney stone   . Obesity   . Recurrent urinary tract infection   . SVT (supraventricular tachycardia) ([place])   . Urinary incontinence   . Vaginal atrophy   . Yeast vaginitis     Past Surgical History:  Procedure Laterality Date  . CARDIAC CATHETERIZATION    . CHOLECYSTECTOMY    . FOOT SURGERY    . JOINT REPLACEMENT    . MASTECTOMY Right 1997  . NASAL SEPTUM SURGERY    . PERIPHERAL VASCULAR CATHETERIZATION N/A 07/08/2015   Procedure: PICC Line Insertion;  Surgeon: Algernon Huxley, MD;  Location: [facility];  Service: Cardiovascular;  Laterality: N/A;  . TONSILLECTOMY    . Vocal cords     Family History:  Family History  Problem Relation Age of Onset  . Lung cancer Father   . Hematuria Mother   . Lung cancer Mother   . Kidney disease Neg Hx   . Bladder Cancer Neg Hx   . Breast cancer Neg Hx    Family Psychiatric  History: Some depression Social History:  History  Alcohol Use No     History  Drug Use No    Social History   Social History  . Marital status: Widowed    Spouse name: N/A  . Number of children: N/A  . Years of education: N/A   Social History Main Topics  . Smoking status: Former Research scientist (life sciences)  . Smokeless tobacco: Former Systems developer    Quit date: 10/29/1995  . Alcohol use No  . Drug use: No  . Sexual activity: No   Other Topics Concern  . None   Social History Narrative  . None   Additional Social History:    Allergies:   Allergies  Allergen Reactions  . Sulfa Antibiotics Hives  . Biaxin [Clarithromycin] Hives  . Influenza A (H1n1) Monoval Vac Other (See Comments)    Pt states that she was told by her MD not to get the influenza vaccine.    . Morphine Other (See Comments)    Reaction:  Dizziness and confusion   . Pyridium [Phenazopyridine Hcl] Other (See Comments)    Reaction:  Unknown   . Ceftriaxone Anxiety  . Latex Rash  . Prednisone Rash  . Tape Rash    Labs:  Results for orders  placed or performed during the hospital encounter of 09/24/16 (from the past 48 hour(s))  Basic metabolic panel     Status: Abnormal   Collection Time: 10/19/16  6:10 PM  Result Value Ref Range   Sodium 129 (L) 135 - 145 mmol/L   Potassium 5.1 3.5 - 5.1 mmol/L   Chloride 93 (L) 101 - 111 mmol/L   CO2 30 22 - 32 mmol/L   Glucose, Bld 289 (H) 65 - 99 mg/dL   BUN 38 (H) 6 - 20 mg/dL   Creatinine, Ser 1.31 (H) 0.44 - 1.00 mg/dL   Calcium 10.2 8.9 - 10.3 mg/dL   GFR calc non Af Amer 39 (L) >60 mL/min   GFR calc Af Amer 46 (L) >60 mL/min    Comment: (NOTE) The eGFR has been calculated using the CKD EPI equation. This calculation has not been validated in all clinical  situations. eGFR's persistently <60 mL/min signify possible Chronic Kidney Disease.    Anion gap 6 5 - 15  Glucose, capillary     Status: Abnormal   Collection Time: 10/19/16  9:04 PM  Result Value Ref Range   Glucose-Capillary 258 (H) 65 - 99 mg/dL  Glucose, capillary     Status: Abnormal   Collection Time: 10/19/16 11:40 PM  Result Value Ref Range   Glucose-Capillary 242 (H) 65 - 99 mg/dL  Creatinine, serum     Status: Abnormal   Collection Time: 10/20/16  5:05 AM  Result Value Ref Range   Creatinine, Ser 1.19 (H) 0.44 - 1.00 mg/dL   GFR calc non Af Amer 44 (L) >60 mL/min   GFR calc Af Amer 51 (L) >60 mL/min    Comment: (NOTE) The eGFR has been calculated using the CKD EPI equation. This calculation has not been validated in all clinical situations. eGFR's persistently <60 mL/min signify possible Chronic Kidney Disease.   Glucose, capillary     Status: Abnormal   Collection Time: 10/20/16  7:36 AM  Result Value Ref Range   Glucose-Capillary 282 (H) 65 - 99 mg/dL  Glucose, capillary     Status: Abnormal   Collection Time: 10/20/16  9:20 AM  Result Value Ref Range   Glucose-Capillary 280 (H) 65 - 99 mg/dL   Comment 1 Notify RN   Glucose, capillary     Status: Abnormal   Collection Time: 10/20/16 12:04 PM   Result Value Ref Range   Glucose-Capillary 224 (H) 65 - 99 mg/dL  Glucose, capillary     Status: Abnormal   Collection Time: 10/20/16  4:25 PM  Result Value Ref Range   Glucose-Capillary 263 (H) 65 - 99 mg/dL  Glucose, capillary     Status: Abnormal   Collection Time: 10/20/16  9:49 PM  Result Value Ref Range   Glucose-Capillary 257 (H) 65 - 99 mg/dL  Basic metabolic panel     Status: Abnormal   Collection Time: 10/21/16  5:13 AM  Result Value Ref Range   Sodium 131 (L) 135 - 145 mmol/L   Potassium 4.2 3.5 - 5.1 mmol/L   Chloride 90 (L) 101 - 111 mmol/L   CO2 31 22 - 32 mmol/L   Glucose, Bld 304 (H) 65 - 99 mg/dL   BUN 28 (H) 6 - 20 mg/dL   Creatinine, Ser 1.07 (H) 0.44 - 1.00 mg/dL   Calcium 10.3 8.9 - 10.3 mg/dL   GFR calc non Af Amer 50 (L) >60 mL/min   GFR calc Af Amer 58 (L) >60 mL/min    Comment: (NOTE) The eGFR has been calculated using the CKD EPI equation. This calculation has not been validated in all clinical situations. eGFR's persistently <60 mL/min signify possible Chronic Kidney Disease.    Anion gap 10 5 - 15  CBC     Status: Abnormal   Collection Time: 10/21/16  5:13 AM  Result Value Ref Range   WBC 11.4 (H) 3.6 - 11.0 K/uL   RBC 4.60 3.80 - 5.20 MIL/uL   Hemoglobin 12.8 12.0 - 16.0 g/dL   HCT 37.4 35.0 - 47.0 %   MCV 81.4 80.0 - 100.0 fL   MCH 27.8 26.0 - 34.0 pg   MCHC 34.2 32.0 - 36.0 g/dL   RDW 16.5 (H) 11.5 - 14.5 %   Platelets 361 150 - 440 K/uL  Glucose, capillary     Status: Abnormal   Collection Time: 10/21/16  8:08 AM  Result Value Ref Range   Glucose-Capillary 280 (H) 65 - 99 mg/dL  Blood gas, arterial     Status: Abnormal   Collection Time: 10/21/16  8:45 AM  Result Value Ref Range   FIO2 0.28    Delivery systems NASAL CANNULA    pH, Arterial 7.47 (H) 7.350 - 7.450   pCO2 arterial 44 32.0 - 48.0 mmHg   pO2, Arterial 82 (L) 83.0 - 108.0 mmHg   Bicarbonate 32.0 (H) 20.0 - 28.0 mmol/L   Acid-Base Excess 7.4 (H) 0.0 - 2.0 mmol/L   O2  Saturation 96.7 %   Patient temperature 37.0    Collection site LEFT RADIAL    Sample type ARTERIAL DRAW    Allens test (pass/fail) PASS PASS  Urinalysis, Routine w reflex microscopic     Status: Abnormal   Collection Time: 10/21/16  9:07 AM  Result Value Ref Range   Color, Urine YELLOW YELLOW   APPearance HAZY (A) CLEAR   Specific Gravity, Urine 1.020 1.005 - 1.030   pH 6.5 5.0 - 8.0   Glucose, UA 100 (A) NEGATIVE mg/dL   Hgb urine dipstick MODERATE (A) NEGATIVE   Bilirubin Urine NEGATIVE NEGATIVE   Ketones, ur TRACE (A) NEGATIVE mg/dL   Protein, ur >300 (A) NEGATIVE mg/dL   Nitrite POSITIVE (A) NEGATIVE   Leukocytes, UA SMALL (A) NEGATIVE  Urinalysis, Microscopic (reflex)     Status: Abnormal   Collection Time: 10/21/16  9:07 AM  Result Value Ref Range   RBC / HPF 0-5 0 - 5 RBC/hpf   WBC, UA TOO NUMEROUS TO COUNT 0 - 5 WBC/hpf   Bacteria, UA MANY (A) NONE SEEN   Squamous Epithelial / LPF 6-30 (A) NONE SEEN   Mucous PRESENT    WBC Casts, UA PRESENT   Glucose, capillary     Status: Abnormal   Collection Time: 10/21/16 12:12 PM  Result Value Ref Range   Glucose-Capillary 277 (H) 65 - 99 mg/dL  Glucose, capillary     Status: Abnormal   Collection Time: 10/21/16  4:55 PM  Result Value Ref Range   Glucose-Capillary 245 (H) 65 - 99 mg/dL    Current Facility-Administered Medications  Medication Dose Route Frequency Provider Last Rate Last Dose  . acetaminophen (TYLENOL) tablet 650 mg  650 mg Oral Q6H PRN Gladstone Lighter, MD   650 mg at 10/20/16 1310   Or  . acetaminophen (TYLENOL) suppository 650 mg  650 mg Rectal Q6H PRN Gladstone Lighter, MD   650 mg at 10/21/16 0550  . alum & mag hydroxide-simeth (MAALOX/MYLANTA) 200-200-20 MG/5ML suspension 30 mL  30 mL Oral Q4H PRN Theodoro Grist, MD   30 mL at 09/25/16 2359  . amLODipine (NORVASC) tablet 10 mg  10 mg Oral Daily Theodoro Grist, MD   Stopped at 10/21/16 1110  . ARIPiprazole (ABILIFY) tablet 10 mg  10 mg Oral Daily Gonzella Lex, MD   Stopped at 10/21/16 1111  . atorvastatin (LIPITOR) tablet 20 mg  20 mg Oral QHS Dustin Flock, MD   20 mg at 10/19/16 2341  . citalopram (CELEXA) tablet 30 mg  30 mg Oral Daily Gonzella Lex, MD   Stopped at 10/21/16 1111  . clonazePAM (KLONOPIN) tablet 0.25 mg  0.25 mg Oral BID Gonzella Lex, MD   Stopped at 10/21/16 1111  . conjugated estrogens (PREMARIN) vaginal cream 1 Applicatorful  1 Applicatorful Vaginal Once per day on Mon Wed Fri Gladstone Lighter, MD   1 Applicatorful at  10/20/16 0854  . darifenacin (ENABLEX) 24 hr tablet 15 mg  15 mg Oral Daily Theodoro Grist, MD   Stopped at 10/21/16 1111  . desmopressin (DDAVP) tablet 0.2 mg  0.2 mg Oral QHS Gladstone Lighter, MD   0.2 mg at 10/19/16 2342  . docusate sodium (COLACE) capsule 100 mg  100 mg Oral BID Napoleon Form, [facility]   Stopped at 10/21/16 1111  . [START ON 10/22/2016] enoxaparin (LOVENOX) injection 40 mg  40 mg Subcutaneous Q24H Rocky Morel, RPH      . feeding supplement (ENSURE ENLIVE) (ENSURE ENLIVE) liquid 237 mL  237 mL Oral TID BM Henreitta Leber, MD   237 mL at 10/20/16 2004  . furosemide (LASIX) tablet 20 mg  20 mg Oral Daily Dustin Flock, MD   Stopped at 10/21/16 1112  . insulin aspart (novoLOG) injection 0-15 Units  0-15 Units Subcutaneous TID WC Loletha Grayer, MD   5 Units at 10/21/16 1704  . insulin aspart (novoLOG) injection 0-5 Units  0-5 Units Subcutaneous QHS Gladstone Lighter, MD   3 Units at 10/20/16 2243  . insulin glargine (LANTUS) injection 65 Units  65 Units Subcutaneous QHS Dustin Flock, MD      . iron polysaccharides (NIFEREX) capsule 150 mg  150 mg Oral TID Gladstone Lighter, MD   Stopped at 10/21/16 1112  . levETIRAcetam (KEPPRA) 1,000 mg in sodium chloride 0.9 % 100 mL IVPB  1,000 mg Intravenous Q12H Dustin Flock, MD   1,000 mg at 10/21/16 0902  . loratadine (CLARITIN) tablet 10 mg  10 mg Oral Daily Gladstone Lighter, MD   Stopped at 10/21/16 1112  . meropenem (MERREM) IVPB SOLR 1 g  1  g Intravenous Q8H Dustin Flock, MD   1 g at 10/21/16 1706  . metoCLOPramide (REGLAN) tablet 5 mg  5 mg Oral TID AC Theodoro Grist, MD   5 mg at 10/20/16 1319  . metoprolol succinate (TOPROL-XL) 24 hr tablet 50 mg  50 mg Oral BID Theodoro Grist, MD   Stopped at 10/21/16 1113  . nystatin (MYCOSTATIN/NYSTOP) topical powder   Topical TID Gladstone Lighter, MD      . ondansetron [facility]) tablet 4 mg  4 mg Oral Q6H PRN Gladstone Lighter, MD   4 mg at 09/29/16 1109   Or  . ondansetron (ZOFRAN) injection 4 mg  4 mg Intravenous Q6H PRN Gladstone Lighter, MD   4 mg at 09/27/16 0950  . pantoprazole (PROTONIX) EC tablet 40 mg  40 mg Oral BID Gladstone Lighter, MD   Stopped at 10/21/16 1113  . polyethylene glycol (MIRALAX / GLYCOLAX) packet 17 g  17 g Oral Daily Dustin Flock, MD   Stopped at 10/21/16 1114  . polyvinyl alcohol (LIQUIFILM TEARS) 1.4 % ophthalmic solution 1 drop  1 drop Both Eyes PRN Theodoro Grist, MD      . potassium chloride SA (K-DUR,KLOR-CON) CR tablet 20 mEq  20 mEq Oral Daily Gladstone Lighter, MD   Stopped at 10/21/16 1114  . senna (SENOKOT) tablet 8.6 mg  1 tablet Oral Daily Napoleon Form, RPH   Stopped at 10/21/16 1114  . traZODone (DESYREL) tablet 50 mg  50 mg Oral QHS Loletha Grayer, MD   50 mg at 10/19/16 2342  . vancomycin (VANCOCIN) 1,250 mg in sodium chloride 0.9 % 250 mL IVPB  1,250 mg Intravenous Once Dustin Flock, MD   1,250 mg at 10/21/16 1705  . [START ON 10/22/2016] vancomycin (VANCOCIN) 1,250 mg in sodium chloride 0.9 %  250 mL IVPB  1,250 mg Intravenous Q18H Dustin Flock, MD      . vitamin E capsule 1,000 Units  1,000 Units Oral Daily Gladstone Lighter, MD   Stopped at 10/21/16 1114    Musculoskeletal: Strength & Muscle Tone: decreased Gait & Station: unable to stand Patient leans: N/A  Psychiatric Specialty Exam: Physical Exam  Nursing note and vitals reviewed. Constitutional: She appears well-developed and well-nourished.  HENT:  Head: Normocephalic and  atraumatic.  Eyes: Conjunctivae are normal. Pupils are equal, round, and reactive to light.  Neck: Normal range of motion.  Cardiovascular: Regular rhythm and normal heart sounds.   Respiratory: Effort normal. No respiratory distress.  GI: Soft.  Musculoskeletal: Normal range of motion.  Neurological: She is alert.  Skin: Skin is warm and dry.  Psychiatric: Her affect is blunt. Her affect is not inappropriate. Her speech is not delayed. She is not withdrawn. Cognition and memory are impaired. She is noncommunicative.    Review of Systems  Unable to perform ROS: Psychiatric disorder    Blood pressure (!) 129/97, pulse 92, temperature 98.6 F (37 C), temperature source Oral, resp. rate 16, height 5' 7"  (1.702 m), weight 115.2 kg (254 lb), SpO2 99 %.Body mass index is 39.78 kg/m.  General Appearance: Fairly Groomed  Eye Contact:  Minimal  Speech:  Negative  Volume:  Decreased  Mood:  Negative  Affect:  Negative  Thought Process:  NA  Orientation:  Negative  Thought Content:  Negative  Suicidal Thoughts:  Unknown  Homicidal Thoughts:  Unknown  Memory:  Negative  Judgement:  Negative  Insight:  Negative  Psychomotor Activity:  Negative  Concentration:  Concentration: Negative  Recall:  Negative  Fund of Knowledge:  Negative  Language:  Negative  Akathisia:  Negative  Handed:  Right  AIMS (if indicated):     Assets:  Social Support  ADL's:  Impaired  Cognition:  Impaired,  Severe  Sleep:        Treatment Plan Summary: Daily contact with patient to assess and evaluate symptoms and progress in treatment, Medication management and Plan Can't continue course of ECT while the patient is acutely sick. Not getting IV antibiotics and has PICC line in place. I will continue to follow up daily. Spoke with her sister this afternoon. No other change to medication for now. Hopefully she will E able to wake up enough to continue taking by mouth so that we can continue some of the  psychiatric medicine she has been getting.  Disposition: Recommend psychiatric Inpatient admission when medically cleared. Supportive therapy provided about ongoing stressors.  Alethia Berthold, MD 10/21/2016 5:13 PM

## 2016-10-21 NOTE — Progress Notes (Signed)
RT to room for rapid response, patient in no acute distress, appears lethargic, unable to focus.  ABG drawn and resulted in epic

## 2016-10-21 NOTE — Progress Notes (Signed)
RN notified Dr. Weber Cooks of pt.'s condition and morning events. Dr. Algie Coffer stated he will be around to see pt shortly. Will continue to monitor pt. Closely.  Dava Rensch [employer]

## 2016-10-21 NOTE — Progress Notes (Addendum)
NT called RN into the room to see pt, RN approach the room pt.'s temp was 102.9 oral BP was 78/40 automatic in the left arm, O2 98 on room air, pulse 96. RN rechecked BP and was 143/54 automatic. Pt is very lethargic, not responding to sternal rub, pt.'s arms were shaking, her eyes were rolled back. RN called a rapid response and notified Dr. Posey Pronto of pt.'s condition.  Dr. Posey Pronto arrived on the unit, verbal order to give Ativan 2 mg IV once. RN checked pt.'s pupils, right pupil was non-reactive with a size of 2 and left pupil was reactive with a size of 3. New orders were placed by Dr. Posey Pronto. Will continue to monitor pt closely.   Carolyn Valdez [employer]

## 2016-10-21 NOTE — Progress Notes (Signed)
Nutrition Follow-up  DOCUMENTATION CODES:   Morbid obesity  INTERVENTION:  -If pt remains lethargic and unable to safely take po and continuing with aggressive interventions, recommend insertion of NG tube for initiaiton of TF (as well as for meds) as pt with poor nutritional status. Even if pt becomes more arousable and begins to safely take po and meds, pt may still benefit from initiation of TF via NG due to prolonged inadequate nutritional intake (currently eating <25% of meals on average but drinking some Ensure). RD will continue to assess and follow poc.    NUTRITION DIAGNOSIS:   Inadequate oral intake related to acute illness (AMS) as evidenced by meal completion < 25%.  Continues  GOAL:   Patient will meet greater than or equal to 90% of their needs  MONITOR:   PO intake, Supplement acceptance, Labs, Weight trends  REASON FOR ASSESSMENT:   LOS    ASSESSMENT:    Pt s/p first ECT yesterday  Pt s/p rapid response this AM, pt febrile, lethargic, not responding to sternal rub, pt's arms shaking and eyes rolled back. Neurology consulted  Pt on regular diet, recorded po intake <25% of meals on average. Pt does appear to be taking some Ensure  Per Lauren this AM, pt unable to safely take take oral meds or meals at present due to lethargy  Weight relatively stable from 12/28 to 1/03; weight from 12/08 likely stated/estimated  Labs: FSBS 200s Meds: ss novolog, lantus, reglan   Diet Order:  Diet - low sodium heart healthy Diet regular Room service appropriate? Yes; Fluid consistency: Thin  Skin:   (no pressure ulcer (noted stage II pressure ulcer documented 1 month ago))  Last BM:  10/21/16  Height:   Ht Readings from Last 1 Encounters:  10/20/16 5\' 7"  (1.702 m)    Weight:   Wt Readings from Last 1 Encounters:  10/20/16 254 lb (115.2 kg)   Filed Weights   09/24/16 1159 10/14/16 1432 10/20/16 0954  Weight: 290 lb (131.5 kg) 252 lb 12.8 oz (114.7 kg) 254 lb  (115.2 kg)    BMI:  Body mass index is 39.78 kg/m.  Estimated Nutritional Needs:   Kcal:  1600-2000 calories  Protein:  100-140 g  Fluid:  >/= 1.6 L  EDUCATION NEEDS:   No education needs identified at this time  [place], [place], [place] [phone] Pager  [phone] Weekend/On-Call Pager

## 2016-10-21 NOTE — Plan of Care (Signed)
Problem: Education: Goal: Knowledge of [facility] Education information/materials will improve Outcome: Not Progressing Patient is confused.  Problem: Health Behavior/Discharge Planning: Goal: Ability to manage health-related needs will improve Outcome: Not Progressing Patient is confused.  Problem: Activity: Goal: Risk for activity intolerance will decrease Outcome: Not Progressing Patient is not ambulating.  Problem: Fluid Volume: Goal: Ability to maintain a balanced intake and output will improve Outcome: Not Progressing Patient has poor appetite and needs assistance and encouragement to eat.  Problem: Nutrition: Goal: Adequate nutrition will be maintained Outcome: Not Progressing Patient has poor appetite and needs assistance and encouragement to eat.

## 2016-10-21 NOTE — Progress Notes (Addendum)
Patient ID: Carolyn Valdez, female   DOB: 02-08-1941, 76 y.o.   MRN: [id number]   [facility] PROGRESS NOTE  Carolyn Valdez [id number]:[id number] DOB: 1941/01/13 DOA: 09/24/2016 PCP: Leonel Ramsay, MD  HPI/Subjective: Patient had ECT therapy yesterday, called by nurse this morning patient was not responding and has been having twitching movements   objective  Vitals:   10/21/16 1226 10/21/16 1418  BP: (!) 156/72 (!) 129/97  Pulse: 93 92  Resp: (!) 21 16  Temp: 99.7 F (37.6 C) (!) 102.5 F (39.2 C)    Filed Weights   09/24/16 1159 10/14/16 1432 10/20/16 0954  Weight: 290 lb (131.5 kg) 252 lb 12.8 oz (114.7 kg) 254 lb (115.2 kg)    ROS: Review of Systems  Unable to perform ROS: Acuity of condition  Respiratory: Negative for shortness of breath.   Cardiovascular: Negative for chest pain.  Gastrointestinal: Negative for abdominal pain.   Exam: Physical Exam  HENT:  Nose: No mucosal edema.  Mouth/Throat: No oropharyngeal exudate or posterior oropharyngeal edema.  Eyes: Conjunctivae, EOM and lids are normal. Pupils are equal, round, and reactive to light.  Neck: No JVD present. Carotid bruit is not present. No edema present. No thyroid mass and no thyromegaly present.  Cardiovascular: S1 normal and S2 normal.  Exam reveals no gallop.   Murmur heard.  Systolic murmur is present with a grade of 2/6  Pulses:      Dorsalis pedis pulses are 2+ on the right side, and 2+ on the left side.  Respiratory: No respiratory distress. She has no decreased breath sounds. She has no wheezes. She has no rhonchi. She has no rales.  GI: Soft. Bowel sounds are normal. There is no tenderness.  Musculoskeletal:       Right ankle: She exhibits swelling.       Left ankle: She exhibits swelling.  Lymphadenopathy:    She has no cervical adenopathy.  Neurological: She is unresponsive. No cranial nerve deficit.  Skin: Skin is warm. No rash noted. Nails show no clubbing.  Psychiatric:  Poorly  responsive      Data Reviewed: Basic Metabolic Panel:  Recent Labs Lab 10/16/16 0520 10/19/16 1810 10/20/16 0505 10/21/16 0513  NA  --  129*  --  131*  K  --  5.1  --  4.2  CL  --  93*  --  90*  CO2  --  30  --  31  GLUCOSE  --  289*  --  304*  BUN  --  38*  --  28*  CREATININE 0.78 1.31* 1.19* 1.07*  CALCIUM  --  10.2  --  10.3   BNP (last 3 results)  Recent Labs  02/23/16 1617 08/09/16 1550 08/31/16 2145  BNP 181.0* 311.0* 163.0*     CBG:  Recent Labs Lab 10/20/16 1204 10/20/16 1625 10/20/16 2149 10/21/16 0808 10/21/16 1212  GLUCAP 224* 263* 257* 280* 277*     Scheduled Meds: . amLODipine  10 mg Oral Daily  . ARIPiprazole  10 mg Oral Daily  . atorvastatin  20 mg Oral QHS  . citalopram  30 mg Oral Daily  . clonazePAM  0.25 mg Oral BID  . conjugated estrogens  1 Applicatorful Vaginal Once per day on Mon Wed Fri  . darifenacin  15 mg Oral Daily  . desmopressin  0.2 mg Oral QHS  . docusate sodium  100 mg Oral BID  . [START ON 10/22/2016] enoxaparin (LOVENOX) injection  40 mg  Subcutaneous Q24H  . feeding supplement (ENSURE ENLIVE)  237 mL Oral TID BM  . furosemide  20 mg Oral Daily  . insulin aspart  0-15 Units Subcutaneous TID WC  . insulin aspart  0-5 Units Subcutaneous QHS  . insulin glargine  65 Units Subcutaneous QHS  . iron polysaccharides  150 mg Oral TID  . levETIRAcetam  1,000 mg Intravenous Q12H  . loratadine  10 mg Oral Daily  . meropenem  1 g Intravenous Q8H  . metoCLOPramide  5 mg Oral TID AC  . metoprolol succinate  50 mg Oral BID  . nystatin   Topical TID  . pantoprazole  40 mg Oral BID  . polyethylene glycol  17 g Oral Daily  . potassium chloride SA  20 mEq Oral Daily  . senna  1 tablet Oral Daily  . traZODone  50 mg Oral QHS  . vancomycin  1,250 mg Intravenous Once  . [START ON 10/22/2016] vancomycin  1,250 mg Intravenous Q18H  . vitamin E  1,000 Units Oral Daily    Assessment/Plan:  1. Acute encephalopathy patient also has  concurrent fever.: Questionable related to her recent ECT therapy. I will order stat CT scan of the head obtained ABG of her brain, due to fever will order a urinalysis blood cultures and a chest x-ray. 2. A severe depression.  Per pychiatry 3. Type 2 diabetes- increase lantus bg still elevated  4. Hyperlipidemia unspecified. continue atorvastatin 5. Essential hypertension on Norvasc t Toprol 6. GERD on Protonix 7. History of diabetic gastroparesis on Reglan 8. History of diastolic congestive heart failure and lower extremity edema.resume lasix 9. Weakness. Unable to do much with physical therapy 10. Failure to thrive.  Prognosis poor updated neice  Code Status:     Code Status Orders        Start     Ordered   10/05/16 1217  Do not attempt resuscitation (DNR)  Continuous    Question Answer Comment  In the event of cardiac or respiratory ARREST Do not call a "code blue"   In the event of cardiac or respiratory ARREST Do not perform Intubation, CPR, defibrillation or ACLS   In the event of cardiac or respiratory ARREST Use medication by any route, position, wound care, and other measures to relive pain and suffering. May use oxygen, suction and manual treatment of airway obstruction as needed for comfort.      10/05/16 1216    Code Status History    Date Active Date Inactive Code Status Order ID Comments User Context   09/24/2016  5:31 PM 10/05/2016 12:16 PM Full Code [id number]  Gladstone Lighter, MD Inpatient   09/01/2016  2:17 AM 09/05/2016  4:28 PM Full Code [id number]  Harvie Bridge, DO Inpatient   08/09/2016  8:27 PM 08/12/2016  8:15 PM Full Code [id number]  Henreitta Leber, MD Inpatient   09/30/2015  2:23 PM 10/07/2015  5:37 PM Full Code [id number]  Loletha Grayer, MD ED   09/05/2015  5:56 PM 09/09/2015  5:38 PM DNR [id number]  Loletha Grayer, MD ED    Advance Directive Documentation   [place] Most Recent Value  Type of Advance Directive  Living will  Pre-existing  out of facility DNR order (yellow form or pink MOST form)  No data  "MOST" Form in Place?  No data     Disposition Plan:As per psychiatry no today states that patient will need inpatient psychiatric Consultants:  Psychiatry  Time spent: 65min critical care time  Posey Pronto [facility]

## 2016-10-21 NOTE — Progress Notes (Signed)
Anticoagulation monitoring(Lovenox):  76 yo  female ordered Lovenox 40 mg Q12h  Filed Weights   09/24/16 1159 10/14/16 1432 10/20/16 0954  Weight: 290 lb (131.5 kg) 252 lb 12.8 oz (114.7 kg) 254 lb (115.2 kg)   Body mass index is 39.78 kg/m.   Lab Results  Component Value Date   CREATININE 1.07 (H) 10/21/2016   CREATININE 1.19 (H) 10/20/2016   CREATININE 1.31 (H) 10/19/2016   Estimated Creatinine Clearance: 61.4 mL/min (by C-G formula based on SCr of 1.07 mg/dL (H)). Hemoglobin & Hematocrit     Component Value Date/Time   HGB 12.8 10/21/2016 0513   HGB 10.1 (L) 10/31/2014 1400   HCT 37.4 10/21/2016 0513   HCT 32.3 (L) 10/31/2014 1400     Per Protocol for Patient with estCrcl > 30 ml/min and BMI now < 40 (decreased wt), will transition to Lovenox 40 mg Q24h.

## 2016-10-21 NOTE — Care Management Important Message (Signed)
Important Message  Patient Details  Name: Carolyn Valdez MRN: [id number] Date of Birth: Jul 29, 1941   Medicare Important Message Given:  Yes    Beverly Sessions, RN 10/21/2016, 2:13 PM

## 2016-10-21 NOTE — Progress Notes (Signed)
PT Cancellation Note  Patient Details Name: ANTANETTE RICHWINE MRN: [id number] DOB: 24-Dec-1940   Cancelled Treatment:    Reason Eval/Treat Not Completed: Medical issues which prohibited therapy. Chart reviewed. Pt with rapid response this date secondary to lethargy. Pt remains lethargic per notes. Will hold therapy treatment at this time until further POC established.   Darryn Kydd 10/21/2016, 3:01 PM Greggory Stallion, PT, DPT [phone]

## 2016-10-21 NOTE — Progress Notes (Signed)
RN notified Dr. Posey Pronto of pt still being very lethargic and not being able to take her medication, new orders to hold oral medication. Will continue to monitor pt. closely.   Carolyn Valdez [employer]

## 2016-10-21 NOTE — Progress Notes (Signed)
Pharmacy Antibiotic Note  Carolyn Valdez is a 76 y.o. female admitted on 09/24/2016 with UTI.  Pharmacy has been consulted for vancomycin and meropenem dosing. Pt with hx of MRSA and ESBL UTIs.   Plan: Meropenem 1 g IV q8h  Vancomycin 1250 mg IV x1 then 1250 mg IV q18h with stacked dosing. Goal trough for 15-20 mcg/ml. ?PNA on CXR, pt febrile  Ke 0.042, half life 16.5 h, Vd 58.1 L  Height: 5\' 7"  (170.2 cm) Weight: 254 lb (115.2 kg) IBW/kg (Calculated) : 61.6  Temp (24hrs), Avg:100.8 F (38.2 C), Min:98.5 F (36.9 C), Max:102.9 F (39.4 C)   Recent Labs Lab 10/16/16 0520 10/19/16 1810 10/20/16 0505 10/21/16 0513  WBC  --   --   --  11.4*  CREATININE 0.78 1.31* 1.19* 1.07*    Estimated Creatinine Clearance: 61.4 mL/min (by C-G formula based on SCr of 1.07 mg/dL (H)).    Allergies  Allergen Reactions  . Sulfa Antibiotics Hives  . Biaxin [Clarithromycin] Hives  . Influenza A (H1n1) Monoval Vac Other (See Comments)    Pt states that she was told by her MD not to get the influenza vaccine.    . Morphine Other (See Comments)    Reaction:  Dizziness and confusion   . Pyridium [Phenazopyridine Hcl] Other (See Comments)    Reaction:  Unknown   . Ceftriaxone Anxiety  . Latex Rash  . Prednisone Rash  . Tape Rash    Antimicrobials this admission: Levaquin  12/8 x 1 Vancomycin  12/8 >> 12/11, 1/4 >> Meropenem 12/9 >> 12/11, 1/4 >> Augmentin 12/11 >> 12/13  Dose adjustments this admission:   Microbiology results: 12/8 BCx: NG x 5 days  12/8 UCx: NG final  1/4 UCx sent  Thank you for allowing pharmacy to be a part of this patient's care.  Rocky Morel 10/21/2016 3:30 PM

## 2016-10-21 NOTE — Progress Notes (Signed)
Inpatient Diabetes Program Recommendations  AACE/ADA: New Consensus Statement on Inpatient Glycemic Control (2015)  Target Ranges:  Prepandial:   less than 140 mg/dL      Peak postprandial:   less than 180 mg/dL (1-2 hours)      Critically ill patients:  140 - 180 mg/dL   Results for Carolyn Valdez, Carolyn Valdez (MRN [id number]) as of 10/21/2016 09:32  Ref. Range 10/20/2016 09:20 10/20/2016 12:04 10/20/2016 16:25 10/20/2016 21:49  Glucose-Capillary Latest Ref Range: 65 - 99 mg/dL 280 (H) 224 (H) 263 (H) 257 (H)   Results for Carolyn Valdez, Carolyn Valdez (MRN [id number]) as of 10/21/2016 09:32  Ref. Range 10/21/2016 08:08  Glucose-Capillary Latest Ref Range: 65 - 99 mg/dL 280 (H)    Home DM Meds: Lantus 50 units QHS Novolog 0-20 TID Actos 30 mg daily  Current Insulin Orders: Lantus 58 units QHS Novolog Moderate Correction Scale/ SSI (0-15 units) TID AC + HS        MD- Note Lantus increased last PM and Novolog 3 units TID (meal coverage) stopped.    Note that MD would like to simplify home insulin/diabetes medication regimen due to complicated disposition at time of d/c.  MD- Please consider the following:  1. Fasting glucose still elevated- Please consider increasing Lantus further to 65 units QHS  2. Start DPP-4 inhibitor to help with post-meal glucose elevations (since Novolog meal coverage stopped).  Could try Tradjenta 5 mg daily.     --Will follow patient during hospitalization--  Wyn Quaker RN, MSN, CDE Diabetes Coordinator Inpatient Glycemic Control Team Team Pager: [phone] (8a-5p)

## 2016-10-22 ENCOUNTER — Inpatient Hospital Stay: Payer: Medicare Other | Admitting: Anesthesiology

## 2016-10-22 ENCOUNTER — Inpatient Hospital Stay: Payer: Medicare Other

## 2016-10-22 DIAGNOSIS — F333 Major depressive disorder, recurrent, severe with psychotic symptoms: Secondary | ICD-10-CM

## 2016-10-22 DIAGNOSIS — R4182 Altered mental status, unspecified: Secondary | ICD-10-CM

## 2016-10-22 DIAGNOSIS — R569 Unspecified convulsions: Secondary | ICD-10-CM

## 2016-10-22 DIAGNOSIS — Z515 Encounter for palliative care: Secondary | ICD-10-CM

## 2016-10-22 DIAGNOSIS — Z7189 Other specified counseling: Secondary | ICD-10-CM

## 2016-10-22 LAB — GLUCOSE, CAPILLARY
GLUCOSE-CAPILLARY: 214 mg/dL — AB (ref 65–99)
GLUCOSE-CAPILLARY: 238 mg/dL — AB (ref 65–99)
Glucose-Capillary: 168 mg/dL — ABNORMAL HIGH (ref 65–99)
Glucose-Capillary: 177 mg/dL — ABNORMAL HIGH (ref 65–99)

## 2016-10-22 LAB — URINE CULTURE

## 2016-10-22 MED ORDER — INSULIN ASPART 100 UNIT/ML ~~LOC~~ SOLN
0.0000 [IU] | SUBCUTANEOUS | Status: DC
Start: 2016-10-22 — End: 2016-10-26
  Administered 2016-10-22: 5 [IU] via SUBCUTANEOUS
  Administered 2016-10-22: 3 [IU] via SUBCUTANEOUS
  Administered 2016-10-23: 2 [IU] via SUBCUTANEOUS
  Administered 2016-10-23 – 2016-10-24 (×3): 3 [IU] via SUBCUTANEOUS
  Administered 2016-10-24: 5 [IU] via SUBCUTANEOUS
  Administered 2016-10-24: 11 [IU] via SUBCUTANEOUS
  Administered 2016-10-24: 5 [IU] via SUBCUTANEOUS
  Administered 2016-10-25: 3 [IU] via SUBCUTANEOUS
  Administered 2016-10-25: 8 [IU] via SUBCUTANEOUS
  Administered 2016-10-25: 3 [IU] via SUBCUTANEOUS
  Administered 2016-10-25 (×2): 11 [IU] via SUBCUTANEOUS
  Administered 2016-10-25 – 2016-10-26 (×2): 8 [IU] via SUBCUTANEOUS
  Administered 2016-10-26: 3 [IU] via SUBCUTANEOUS
  Filled 2016-10-22: qty 3
  Filled 2016-10-22: qty 11
  Filled 2016-10-22 (×2): qty 5
  Filled 2016-10-22: qty 2
  Filled 2016-10-22: qty 8
  Filled 2016-10-22: qty 3
  Filled 2016-10-22: qty 8
  Filled 2016-10-22 (×4): qty 3
  Filled 2016-10-22 (×2): qty 11
  Filled 2016-10-22: qty 8
  Filled 2016-10-22: qty 3
  Filled 2016-10-22: qty 5

## 2016-10-22 MED ORDER — INSULIN GLARGINE 100 UNIT/ML ~~LOC~~ SOLN
67.0000 [IU] | Freq: Every day | SUBCUTANEOUS | Status: DC
  Administered 2016-10-22 – 2016-10-24 (×3): 67 [IU] via SUBCUTANEOUS
  Filled 2016-10-22 (×5): qty 0.67

## 2016-10-22 MED ORDER — METOPROLOL TARTRATE 5 MG/5ML IV SOLN
5.0000 mg | Freq: Once | INTRAVENOUS | Status: AC
  Administered 2016-10-22: 5 mg via INTRAVENOUS
  Filled 2016-10-22: qty 5

## 2016-10-22 MED ORDER — SODIUM CHLORIDE 0.9 % IV SOLN
INTRAVENOUS | Status: DC | PRN
  Administered 2016-10-22: 10:00:00 via INTRAVENOUS

## 2016-10-22 MED ORDER — HYDRALAZINE HCL 20 MG/ML IJ SOLN: 10.0000 mg | Freq: Four times a day (QID) | INTRAMUSCULAR | Status: DC | PRN

## 2016-10-22 MED ORDER — SODIUM CHLORIDE 0.9 % IV SOLN
500.0000 mg | Freq: Two times a day (BID) | INTRAVENOUS | Status: DC
  Administered 2016-10-22 – 2016-10-25 (×6): 500 mg via INTRAVENOUS
  Filled 2016-10-22 (×7): qty 5

## 2016-10-22 MED ORDER — OLANZAPINE 5 MG PO TBDP
5.0000 mg | ORAL_TABLET | Freq: Every day | ORAL | Status: DC
  Administered 2016-10-22 – 2016-10-25 (×4): 5 mg via ORAL
  Filled 2016-10-22 (×4): qty 1

## 2016-10-22 NOTE — Care Management (Signed)
Followed up with sister to see if she had made any further progress on arranging care for insulin administration in the home. Sister states "no there is no need to mess with any of that yet".  I informed her that there is a need to proceed continue planning, as our goal is always to progress towards discharge.    Notified sister that [facility] would be able to provide 24 hour care in the home, and they would be able to provide insulin administration.  Their quoted cost is $20 per hour

## 2016-10-22 NOTE — Progress Notes (Signed)
Inpatient Diabetes Program Recommendations  AACE/ADA: New Consensus Statement on Inpatient Glycemic Control (2015)  Target Ranges:  Prepandial:   less than 140 mg/dL      Peak postprandial:   less than 180 mg/dL (1-2 hours)      Critically ill patients:  140 - 180 mg/dL   Results for [name], [name] (MRN [id number]) as of 10/22/2016 10:26  Ref. Range 10/21/2016 08:08 10/21/2016 12:12 10/21/2016 16:55 10/21/2016 20:55 10/21/2016 23:22 10/22/2016 07:45  Glucose-Capillary Latest Ref Range: 65 - 99 mg/dL 280 (H) 277 (H) 245 (H) 219 (H) 238 (H) 214 (H)   Review of Glycemic Control  Diabetes history: DM2 Outpatient Diabetes medications: Lantus 50 units QHS, Novolog 0-20 units TID, Actos 30 mg daily Current orders for Inpatient glycemic control: Lantus 65 units QHS, Novolog 0-15 units TID with meals, Novolog 0-5 untis QHS  Inpatient Diabetes Program Recommendations: Insulin - Basal: Patient received Lantus 65 units last night and fasting glucose 214 mg/dl this morning. Please consider increasing Lantus to 67 units QHS. Correction (SSI): If patient continues to have poor PO intake, please consider changing frequency of CBGs and Novolog correction to Q4H. Diet: Currently ordered Regular diet. If appropriate, please consider changing diet to Carb Modified.  Thanks, Barnie Alderman, RN, MSN, CDE Diabetes Coordinator Inpatient Diabetes Program [phone] (Team Pager from 8am to 5pm)

## 2016-10-22 NOTE — Clinical Social Work Note (Signed)
CSW continues to follow. Patient's medical condition has declined. ECT is on hold as of now and neurology is going to do a full medical work up to see if rule out potential seizures. CSW to continue to provide support. Shela Leff MSW,LCSW [phone]

## 2016-10-22 NOTE — Progress Notes (Signed)
Pharmacy Antibiotic Note  Carolyn Valdez is a 76 y.o. female admitted on 09/24/2016 with UTI.  Pharmacy has been consulted for vancomycin and meropenem dosing. Pt with hx of MRSA UTI and ESBL UTIs.   Plan: -Meropenem 1 g IV q8h  -Vancomycin 1250 mg IV x1 then 1250 mg IV q18h with stacked dosing. Goal trough for 15-20 mcg/ml. ?PNA on CXR (Vague opacity at the left lung base may represent atelectasis or Pneumonia), pt febrile Temp 102.5  Ke 0.042, half life 16.5 h, Vd 58.1 L      Height: 5\' 7"  (170.2 cm) Weight: 254 lb (115.2 kg) IBW/kg (Calculated) : 61.6  Temp (24hrs), Avg:100 F (37.8 C), Min:98.6 F (37 C), Max:102.5 F (39.2 C)   Recent Labs Lab 10/16/16 0520 10/19/16 1810 10/20/16 0505 10/21/16 0513  WBC  --   --   --  11.4*  CREATININE 0.78 1.31* 1.19* 1.07*    Estimated Creatinine Clearance: 61.4 mL/min (by C-G formula based on SCr of 1.07 mg/dL (H)).    Allergies  Allergen Reactions  . Sulfa Antibiotics Hives  . Biaxin [Clarithromycin] Hives  . Influenza A (H1n1) Monoval Vac Other (See Comments)    Pt states that she was told by her MD not to get the influenza vaccine.    . Morphine Other (See Comments)    Reaction:  Dizziness and confusion   . Pyridium [Phenazopyridine Hcl] Other (See Comments)    Reaction:  Unknown   . Ceftriaxone Anxiety  . Latex Rash  . Prednisone Rash  . Tape Rash    Antimicrobials this admission: Levaquin  12/8 x 1 Vancomycin  12/8 >> 12/11, 1/4 >> Meropenem 12/9 >> 12/11, 1/4 >> Augmentin 12/11 >> 12/13  Dose adjustments this admission:   Microbiology results: 12/8 BCx: NG x 5 days  12/8 UCx: NG final  1/4 UA=+nitrite, TNTC WBC, small leuk, many bac:  1/4 UCx sent  Thank you for allowing pharmacy to be a part of this patient's care.  Bao Coreas A 10/22/2016 9:56 AM

## 2016-10-22 NOTE — Consult Note (Signed)
[facility] Consult   Reason for Consult:  This is a follow-up consult for this 76 year old woman with depression Referring Physician:  Leslye Valdez Patient Identification: Carolyn Valdez MRN:  [id number] Principal Diagnosis: Severe major depression, single episode, with psychotic features [facility]) Diagnosis:   Patient Active Problem List   Diagnosis Date Noted  . DNR (do not resuscitate) [Z66] 10/05/2016  . Palliative care by specialist [Z51.5] 10/05/2016  . Depression, major, recurrent, severe with psychosis ([place]) [F33.3] 10/05/2016  . Severe major depression, single episode, with psychotic features ([place]) [F32.3] 10/04/2016  . Cellulitis of leg, right [L03.115] 09/29/2016  . Proctitis [K62.89] 09/29/2016  . Hypercapnia [R06.89] 09/29/2016  . Acute urinary retention [R33.8] 09/29/2016  . UTI (urinary tract infection) [N39.0] 09/29/2016  . Weakness [R53.1] 09/29/2016  . Subacute delirium [F05] 09/28/2016  . Gastrointestinal hemorrhage [K92.2]   . Diffuse abdominal pain [R10.84]   . Altered mental status [R41.82] 09/24/2016  . Pressure injury of skin [L89.90] 09/02/2016  . Respiratory failure with hypoxia ([place]) [J96.91] 09/01/2016  . Lymphedema [I89.0] 08/16/2016  . CHF (congestive heart failure) ([place]) [I50.9] 08/09/2016  . Congestive heart failure ([place]) [I50.9] 11/25/2015  . Nocturia [R35.1] 11/05/2015  . Urinary frequency [R35.0] 11/05/2015  . Acute respiratory failure with hypoxia ([place]) [J96.01] 09/05/2015  . Recurrent UTI [N39.0] 04/20/2015  . Incontinence [R32] 04/20/2015  . Diabetes mellitus, type 2 ([place]) [E11.9] 04/17/2015  . ESBL (extended spectrum beta-lactamase) producing bacteria infection [A49.9, Z16.12] 04/17/2015  . BP (high blood pressure) [I10] 04/17/2015  . Frequent UTI [N39.0] 04/17/2015  . Absence of bladder continence [R32] 04/17/2015  . Iron deficiency anemia [D50.9] 03/08/2015  . Absolute anemia [D64.9] 09/25/2014  . Abdominal pain, lower [R10.30]  09/25/2014  . Urge incontinence [N39.41] 02/19/2013  . FOM (frequency of micturition) [R35.0] 02/19/2013  . Bladder infection, chronic [N30.20] 08/11/2012  . Difficult or painful urination [R30.0] 07/26/2012  . Lower urinary tract infection [N39.0] 12/31/2011  . Diabetes mellitus ([place]) [E11.9] 12/31/2011    Total Time spent with patient: 20 minutes  Subjective:   [name] is a 76 y.o. female patient admitted with patient made no comment.  HPI: Follow-up for January 3. Patient had her first ECT treatment this morning. Treatment went well without any complications. Prior to treatment this morning the patient was actually being quite verbal especially with the nursing staff and her sister. I have learned that she tends to be uniquely uncommunicative when I am involved for some reason.  Follow-up for Thursday the fourth. Patient unfortunately today has taken a turn for the worse medically. She has been much less responsive today. When I came to see her this afternoon she briefly seemed to be able to speak but then became less responsive. Her sister think she may have spoken a little bit to her. Patient's fever is up at 102 and her urine is grossly infected. Patient was shaking when I came by but it is not seizure-like at all. I doubt very much that she had a seizure. There is no reason to think that what happened. Her head CT is normal. I would probably not suggest continuing antiseizure medicine.  Follow-up for Friday the fifth. Patient seen. Spoke with her sister again. Chart reviewed. Patient is unresponsive essentially this afternoon. She opened her eyes and made brief eye contact but would not follow commands to move her eyes and did not speak. She is still having a slightly elevated temperature and her blood pressure is a little bit up. Urine  culture came back mixed infection. MRI came back showing essentially the same thing the CT showed which is no focal change but chronic microvascular  disease.  Past Psychiatric History: Past history of depression possible bipolar disorder.  Risk to Self: Is patient at risk for suicide?: No Risk to Others:   Prior Inpatient Therapy:   Prior Outpatient Therapy:    Past Medical History:  Past Medical History:  Diagnosis Date  . Anxiety   . Breast cancer ([place])    16 yrs ago  . CHF (congestive heart failure) ([place])   . Diabetes mellitus without complication ([place])   . DJD (degenerative joint disease)   . Dysuria   . H/O total knee replacement    right  . HTN (hypertension)   . Hypercholesteremia   . Kidney stone   . Obesity   . Recurrent urinary tract infection   . SVT (supraventricular tachycardia) ([place])   . Urinary incontinence   . Vaginal atrophy   . Yeast vaginitis     Past Surgical History:  Procedure Laterality Date  . CARDIAC CATHETERIZATION    . CHOLECYSTECTOMY    . FOOT SURGERY    . JOINT REPLACEMENT    . MASTECTOMY Right 1997  . NASAL SEPTUM SURGERY    . PERIPHERAL VASCULAR CATHETERIZATION N/A 07/08/2015   Procedure: PICC Line Insertion;  Surgeon: Algernon Huxley, MD;  Location: [facility];  Service: Cardiovascular;  Laterality: N/A;  . TONSILLECTOMY    . Vocal cords     Family History:  Family History  Problem Relation Age of Onset  . Lung cancer Father   . Hematuria Mother   . Lung cancer Mother   . Kidney disease Neg Hx   . Bladder Cancer Neg Hx   . Breast cancer Neg Hx    Family Psychiatric  History: Some depression Social History:  History  Alcohol Use No     History  Drug Use No    Social History   Social History  . Marital status: Widowed    Spouse name: N/A  . Number of children: N/A  . Years of education: N/A   Social History Main Topics  . Smoking status: Former Research scientist (life sciences)  . Smokeless tobacco: Former Systems developer    Quit date: 10/29/1995  . Alcohol use No  . Drug use: No  . Sexual activity: No   Other Topics Concern  . None   Social History Narrative  . None   Additional  Social History:    Allergies:   Allergies  Allergen Reactions  . Sulfa Antibiotics Hives  . Biaxin [Clarithromycin] Hives  . Influenza A (H1n1) Monoval Vac Other (See Comments)    Pt states that she was told by her MD not to get the influenza vaccine.    . Morphine Other (See Comments)    Reaction:  Dizziness and confusion   . Pyridium [Phenazopyridine Hcl] Other (See Comments)    Reaction:  Unknown   . Ceftriaxone Anxiety  . Latex Rash  . Prednisone Rash  . Tape Rash    Labs:  Results for orders placed or performed during the hospital encounter of 09/24/16 (from the past 48 hour(s))  Glucose, capillary     Status: Abnormal   Collection Time: 10/20/16  4:25 PM  Result Value Ref Range   Glucose-Capillary 263 (H) 65 - 99 mg/dL  Glucose, capillary     Status: Abnormal   Collection Time: 10/20/16  9:49 PM  Result Value Ref Range  Glucose-Capillary 257 (H) 65 - 99 mg/dL  Basic metabolic panel     Status: Abnormal   Collection Time: 10/21/16  5:13 AM  Result Value Ref Range   Sodium 131 (L) 135 - 145 mmol/L   Potassium 4.2 3.5 - 5.1 mmol/L   Chloride 90 (L) 101 - 111 mmol/L   CO2 31 22 - 32 mmol/L   Glucose, Bld 304 (H) 65 - 99 mg/dL   BUN 28 (H) 6 - 20 mg/dL   Creatinine, Ser 1.07 (H) 0.44 - 1.00 mg/dL   Calcium 10.3 8.9 - 10.3 mg/dL   GFR calc non Af Amer 50 (L) >60 mL/min   GFR calc Af Amer 58 (L) >60 mL/min    Comment: (NOTE) The eGFR has been calculated using the CKD EPI equation. This calculation has not been validated in all clinical situations. eGFR's persistently <60 mL/min signify possible Chronic Kidney Disease.    Anion gap 10 5 - 15  CBC     Status: Abnormal   Collection Time: 10/21/16  5:13 AM  Result Value Ref Range   WBC 11.4 (H) 3.6 - 11.0 K/uL   RBC 4.60 3.80 - 5.20 MIL/uL   Hemoglobin 12.8 12.0 - 16.0 g/dL   HCT 37.4 35.0 - 47.0 %   MCV 81.4 80.0 - 100.0 fL   MCH 27.8 26.0 - 34.0 pg   MCHC 34.2 32.0 - 36.0 g/dL   RDW 16.5 (H) 11.5 - 14.5 %    Platelets 361 150 - 440 K/uL  Glucose, capillary     Status: Abnormal   Collection Time: 10/21/16  8:08 AM  Result Value Ref Range   Glucose-Capillary 280 (H) 65 - 99 mg/dL  Blood gas, arterial     Status: Abnormal   Collection Time: 10/21/16  8:45 AM  Result Value Ref Range   FIO2 0.28    Delivery systems NASAL CANNULA    pH, Arterial 7.47 (H) 7.350 - 7.450   pCO2 arterial 44 32.0 - 48.0 mmHg   pO2, Arterial 82 (L) 83.0 - 108.0 mmHg   Bicarbonate 32.0 (H) 20.0 - 28.0 mmol/L   Acid-Base Excess 7.4 (H) 0.0 - 2.0 mmol/L   O2 Saturation 96.7 %   Patient temperature 37.0    Collection site LEFT RADIAL    Sample type ARTERIAL DRAW    Allens test (pass/fail) PASS PASS  Urine culture     Status: Abnormal   Collection Time: 10/21/16  9:06 AM  Result Value Ref Range   Specimen Description URINE, RANDOM    Special Requests NONE    Culture MULTIPLE SPECIES PRESENT, SUGGEST RECOLLECTION (A)    Report Status 10/22/2016 FINAL   Urinalysis, Routine w reflex microscopic     Status: Abnormal   Collection Time: 10/21/16  9:07 AM  Result Value Ref Range   Color, Urine YELLOW YELLOW   APPearance HAZY (A) CLEAR   Specific Gravity, Urine 1.020 1.005 - 1.030   pH 6.5 5.0 - 8.0   Glucose, UA 100 (A) NEGATIVE mg/dL   Hgb urine dipstick MODERATE (A) NEGATIVE   Bilirubin Urine NEGATIVE NEGATIVE   Ketones, ur TRACE (A) NEGATIVE mg/dL   Protein, ur >300 (A) NEGATIVE mg/dL   Nitrite POSITIVE (A) NEGATIVE   Leukocytes, UA SMALL (A) NEGATIVE  Urinalysis, Microscopic (reflex)     Status: Abnormal   Collection Time: 10/21/16  9:07 AM  Result Value Ref Range   RBC / HPF 0-5 0 - 5 RBC/hpf   WBC, UA  TOO NUMEROUS TO COUNT 0 - 5 WBC/hpf   Bacteria, UA MANY (A) NONE SEEN   Squamous Epithelial / LPF 6-30 (A) NONE SEEN   Mucous PRESENT    WBC Casts, UA PRESENT   Glucose, capillary     Status: Abnormal   Collection Time: 10/21/16 12:12 PM  Result Value Ref Range   Glucose-Capillary 277 (H) 65 - 99 mg/dL   Glucose, capillary     Status: Abnormal   Collection Time: 10/21/16  4:55 PM  Result Value Ref Range   Glucose-Capillary 245 (H) 65 - 99 mg/dL  Glucose, capillary     Status: Abnormal   Collection Time: 10/21/16  8:55 PM  Result Value Ref Range   Glucose-Capillary 219 (H) 65 - 99 mg/dL  Glucose, capillary     Status: Abnormal   Collection Time: 10/21/16 11:22 PM  Result Value Ref Range   Glucose-Capillary 238 (H) 65 - 99 mg/dL  Glucose, capillary     Status: Abnormal   Collection Time: 10/22/16  7:45 AM  Result Value Ref Range   Glucose-Capillary 214 (H) 65 - 99 mg/dL  Glucose, capillary     Status: Abnormal   Collection Time: 10/22/16 11:28 AM  Result Value Ref Range   Glucose-Capillary 168 (H) 65 - 99 mg/dL    Current Facility-Administered Medications  Medication Dose Route Frequency Provider Last Rate Last Dose  . acetaminophen (TYLENOL) tablet 650 mg  650 mg Oral Q6H PRN Gladstone Lighter, MD   650 mg at 10/20/16 1310   Or  . acetaminophen (TYLENOL) suppository 650 mg  650 mg Rectal Q6H PRN Gladstone Lighter, MD   650 mg at 10/21/16 0550  . alum & mag hydroxide-simeth (MAALOX/MYLANTA) 200-200-20 MG/5ML suspension 30 mL  30 mL Oral Q4H PRN Theodoro Grist, MD   30 mL at 09/25/16 2359  . amLODipine (NORVASC) tablet 10 mg  10 mg Oral Daily Theodoro Grist, MD   Stopped at 10/21/16 1110  . atorvastatin (LIPITOR) tablet 20 mg  20 mg Oral QHS Dustin Flock, MD   20 mg at 10/19/16 2341  . clonazePAM (KLONOPIN) tablet 0.25 mg  0.25 mg Oral BID Gonzella Lex, MD   Stopped at 10/21/16 1111  . conjugated estrogens (PREMARIN) vaginal cream 1 Applicatorful  1 Applicatorful Vaginal Once per day on Mon Wed Fri Gladstone Lighter, MD   1 Applicatorful at 26/37/85 0854  . darifenacin (ENABLEX) 24 hr tablet 15 mg  15 mg Oral Daily Theodoro Grist, MD   Stopped at 10/21/16 1111  . desmopressin (DDAVP) tablet 0.2 mg  0.2 mg Oral QHS Gladstone Lighter, MD   0.2 mg at 10/19/16 2342  . docusate sodium  (COLACE) capsule 100 mg  100 mg Oral BID Napoleon Form, [facility]   Stopped at 10/21/16 1111  . enoxaparin (LOVENOX) injection 40 mg  40 mg Subcutaneous Q24H Rocky Morel, RPH      . feeding supplement (ENSURE ENLIVE) (ENSURE ENLIVE) liquid 237 mL  237 mL Oral TID BM Henreitta Leber, MD   237 mL at 10/20/16 2004  . furosemide (LASIX) tablet 20 mg  20 mg Oral Daily Dustin Flock, MD   Stopped at 10/21/16 1112  . insulin aspart (novoLOG) injection 0-15 Units  0-15 Units Subcutaneous Q4H Dustin Flock, MD      . insulin glargine (LANTUS) injection 67 Units  67 Units Subcutaneous QHS Dustin Flock, MD      . iron polysaccharides (NIFEREX) capsule 150 mg  150 mg Oral  TID Gladstone Lighter, MD   Stopped at 10/21/16 1112  . levETIRAcetam (KEPPRA) 500 mg in sodium chloride 0.9 % 100 mL IVPB  500 mg Intravenous Q12H Alexis Goodell, MD      . loratadine (CLARITIN) tablet 10 mg  10 mg Oral Daily Gladstone Lighter, MD   Stopped at 10/21/16 1112  . meropenem (MERREM) IVPB SOLR 1 g  1 g Intravenous Q8H Dustin Flock, MD   1 g at 10/22/16 1011  . metoCLOPramide (REGLAN) tablet 5 mg  5 mg Oral TID AC Theodoro Grist, MD   5 mg at 10/20/16 1319  . metoprolol succinate (TOPROL-XL) 24 hr tablet 50 mg  50 mg Oral BID Theodoro Grist, MD   Stopped at 10/21/16 1113  . nystatin (MYCOSTATIN/NYSTOP) topical powder   Topical TID Gladstone Lighter, MD      . OLANZapine zydis (ZYPREXA) disintegrating tablet 5 mg  5 mg Oral QHS Gonzella Lex, MD      . ondansetron [facility]) tablet 4 mg  4 mg Oral Q6H PRN Gladstone Lighter, MD   4 mg at 09/29/16 1109   Or  . ondansetron (ZOFRAN) injection 4 mg  4 mg Intravenous Q6H PRN Gladstone Lighter, MD   4 mg at 09/27/16 0950  . pantoprazole (PROTONIX) EC tablet 40 mg  40 mg Oral BID Gladstone Lighter, MD   Stopped at 10/21/16 1113  . polyethylene glycol (MIRALAX / GLYCOLAX) packet 17 g  17 g Oral Daily Dustin Flock, MD   Stopped at 10/21/16 1114  . polyvinyl alcohol (LIQUIFILM TEARS) 1.4  % ophthalmic solution 1 drop  1 drop Both Eyes PRN Theodoro Grist, MD      . potassium chloride SA (K-DUR,KLOR-CON) CR tablet 20 mEq  20 mEq Oral Daily Gladstone Lighter, MD   Stopped at 10/21/16 1114  . senna (SENOKOT) tablet 8.6 mg  1 tablet Oral Daily Napoleon Form, RPH   Stopped at 10/21/16 1114  . traZODone (DESYREL) tablet 50 mg  50 mg Oral QHS Loletha Grayer, MD   50 mg at 10/19/16 2342  . vitamin E capsule 1,000 Units  1,000 Units Oral Daily Gladstone Lighter, MD   Stopped at 10/21/16 1114   Facility-Administered Medications Ordered in Other Encounters  Medication Dose Route Frequency Provider Last Rate Last Dose  . 0.9 %  sodium chloride infusion    Continuous PRN Dionne Bucy, CRNA        Musculoskeletal: Strength & Muscle Tone: decreased Gait & Station: unable to stand Patient leans: N/A  Psychiatric Specialty Exam: Physical Exam  Nursing note and vitals reviewed. Constitutional: She appears well-developed and well-nourished.  HENT:  Head: Normocephalic and atraumatic.  Eyes: Conjunctivae are normal. Pupils are equal, round, and reactive to light.  Neck: Normal range of motion.  Cardiovascular: Regular rhythm and normal heart sounds.   Respiratory: Effort normal. No respiratory distress.  GI: Soft.  Musculoskeletal: Normal range of motion.  Neurological: She is alert.  Skin: Skin is warm and dry.  Psychiatric: Her affect is blunt. Her affect is not inappropriate. Her speech is not delayed. She is not withdrawn. Cognition and memory are impaired. She is noncommunicative.    Review of Systems  Unable to perform ROS: Psychiatric disorder    Blood pressure (!) 158/63, pulse 98, temperature 99.6 F (37.6 C), temperature source Oral, resp. rate 17, height 5' 7"  (1.702 m), weight 115.2 kg (254 lb), SpO2 98 %.Body mass index is 39.78 kg/m.  General Appearance: Fairly Groomed  Eye Contact:  Minimal  Speech:  Negative  Volume:  Decreased  Mood:  Negative  Affect:   Negative  Thought Process:  NA  Orientation:  Negative  Thought Content:  Negative  Suicidal Thoughts:  Unknown  Homicidal Thoughts:  Unknown  Memory:  Negative  Judgement:  Negative  Insight:  Negative  Psychomotor Activity:  Negative  Concentration:  Concentration: Negative  Recall:  Negative  Fund of Knowledge:  Negative  Language:  Negative  Akathisia:  Negative  Handed:  Right  AIMS (if indicated):     Assets:  Social Support  ADL's:  Impaired  Cognition:  Impaired,  Severe  Sleep:        Treatment Plan Summary: Daily contact with patient to assess and evaluate symptoms and progress in treatment, Medication management and Plan As far as treating her depression at this point clearly she is in a different medical condition. We would not be able to continue ECT at this point in her current condition. On the other hand I still doubt that any of this was a seizure or related in any way to the ECT. Treatment went fine with no complication and there is no evidence of a stroke or new specific neurologic lesion. I suspect most likely that sepsis is the main issue. I had stopped her antipsychotic and antidepressant yesterday out of the remote concern of NMS or serotonin syndrome. On further assessment I doubt that that was any part of the problem. I'm going to restart an antipsychotic using Zyprexa oral dissolving which can be given without the patient needing to cooperate orally as 5 mg a night. I will sign this out to the doctor over the weekend. I spoke with the sister who is now reconsulting with palliative care. I expressed to her my opinion that I think it's likely that much of the current condition is still something that is likely to improve but that she would have to take all opinions into account to make a decision. We are not going to put the patient on the schedule for ECT on Monday but I will of course continue to follow-up closely.  Disposition: Recommend psychiatric Inpatient  admission when medically cleared. Supportive therapy provided about ongoing stressors.  Alethia Berthold, MD 10/22/2016 4:15 PM

## 2016-10-22 NOTE — Significant Event (Signed)
Rapid response called for patient who was having seizure activity, febrile, s/p ECT and uti.  Patient's vital signs stable.  Dr Dustin Flock at bedside and will assume care for patient and place new orders.  Patient stable and able to remain in her room.

## 2016-10-22 NOTE — Progress Notes (Signed)
Subjective: Re-consult called due to possible seizure activity.  Patient has been managed by psychiatry with medications and ECT.  Yesterday felt to have an episode when she was less responsive, eyes rolled upward, head turned to the left and extremities shaking.  Patient febrile at that time.  Patient started on Keppra.    Objective: Current vital signs: BP (!) 158/63 (BP Location: Left Arm)   Pulse 98   Temp 99.6 F (37.6 C) (Oral)   Resp 17   Ht 5\' 7"  (1.702 m)   Wt 115.2 kg (254 lb)   SpO2 98%   BMI 39.78 kg/m  Vital signs in last 24 hours: Temp:  [98.6 F (37 C)-102.5 F (39.2 C)] 99.6 F (37.6 C) (01/05 0747) Pulse Rate:  [88-98] 98 (01/05 0747) Resp:  [16-28] 17 (01/05 0747) BP: (129-183)/(56-97) 158/63 (01/05 0747) SpO2:  [97 %-100 %] 98 % (01/05 0747)  Intake/Output from previous day: 01/04 0701 - 01/05 0700 In: 1480 [IV Piggyback:1480] Out: -  Intake/Output this shift: No intake/output data recorded. Nutritional status: Diet - low sodium heart healthy Diet regular Room service appropriate? Yes; Fluid consistency: Thin  Neurologic Exam: Mental Status: Patient resting but awakened with light sternal rub.  No speech but mostly groaning.  Follows some simple commands.   Cranial Nerves: II: Discs flat bilaterally; Blinks to bilateral confrontation, pupils equal, round, reactive to light and accommodation III,IV, VI: ptosis not present, extra-ocular motions intact bilaterally V,VII: right facial droop, facial light touch sensation normal bilaterally VIII: hearing normal bilaterally IX,X: gag reflex present XI: unable to test XII: unable to test Motor: Patient moves all extremities but does not cooperate for formal testing.  Gives a weak hand grip bilaterally to command.     Lab Results: Basic Metabolic Panel:  Recent Labs Lab 10/16/16 0520 10/19/16 1810 10/20/16 0505 10/21/16 0513  NA  --  129*  --  131*  K  --  5.1  --  4.2  CL  --  93*  --  90*  CO2   --  30  --  31  GLUCOSE  --  289*  --  304*  BUN  --  38*  --  28*  CREATININE 0.78 1.31* 1.19* 1.07*  CALCIUM  --  10.2  --  10.3    Liver Function Tests: No results for input(s): AST, ALT, ALKPHOS, BILITOT, PROT, ALBUMIN in the last 168 hours. No results for input(s): LIPASE, AMYLASE in the last 168 hours. No results for input(s): AMMONIA in the last 168 hours.  CBC:  Recent Labs Lab 10/21/16 0513  WBC 11.4*  HGB 12.8  HCT 37.4  MCV 81.4  PLT 361    Cardiac Enzymes: No results for input(s): CKTOTAL, CKMB, CKMBINDEX, TROPONINI in the last 168 hours.  Lipid Panel: No results for input(s): CHOL, TRIG, HDL, CHOLHDL, VLDL, LDLCALC in the last 168 hours.  CBG:  Recent Labs Lab 10/21/16 1212 10/21/16 1655 10/21/16 2055 10/21/16 2322 10/22/16 0745  GLUCAP 277* 245* 219* 238* 214*    Microbiology: Results for orders placed or performed during the hospital encounter of 09/24/16  Urine culture     Status: None   Collection Time: 09/24/16 12:15 PM  Result Value Ref Range Status   Specimen Description URINE, RANDOM  Final   Special Requests NONE  Final   Culture NO GROWTH Performed at [facility]   Final   Report Status 09/26/2016 FINAL  Final  CULTURE, BLOOD (ROUTINE X 2) w Reflex  to ID Panel     Status: None   Collection Time: 09/24/16  6:35 PM  Result Value Ref Range Status   Specimen Description BLOOD  RIGHT [facility]  Final   Special Requests   Final    BOTTLES DRAWN AEROBIC AND ANAEROBIC AER 8ML ANA 9ML   Culture NO GROWTH 5 DAYS  Final   Report Status 09/29/2016 FINAL  Final  CULTURE, BLOOD (ROUTINE X 2) w Reflex to ID Panel     Status: None   Collection Time: 09/24/16  7:46 PM  Result Value Ref Range Status   Specimen Description BLOOD  RIGHT HAND  Final   Special Requests   Final    BOTTLES DRAWN AEROBIC AND ANAEROBIC  AER 3CC ANA 3ML   Culture NO GROWTH 5 DAYS  Final   Report Status 10/08/2016 FINAL  Final  Urine culture     Status: Abnormal    Collection Time: 10/21/16  9:06 AM  Result Value Ref Range Status   Specimen Description URINE, RANDOM  Final   Special Requests NONE  Final   Culture MULTIPLE SPECIES PRESENT, SUGGEST RECOLLECTION (A)  Final   Report Status 10/22/2016 FINAL  Final    Coagulation Studies: No results for input(s): LABPROT, INR in the last 72 hours.  Imaging: Dg Chest 1 View  Result Date: 10/21/2016 CLINICAL DATA:  Fever, history of breast carcinoma EXAM: CHEST 1 VIEW COMPARISON:  Chest x-ray of 09/11/2016 FINDINGS: There is opacity at the left lung base which may represent atelectasis or developing pneumonia. The right lung is clear. Mediastinal and hilar contours are unremarkable. The heart is mildly enlarged and stable. No bony abnormality is seen. IMPRESSION: 1. Vague opacity at the left lung base may represent atelectasis or pneumonia. Recommend continued follow-up with two-view chest x-ray. 2. Stable cardiomegaly Electronically Signed   By: Ivar Drape M.D.   On: 10/21/2016 08:51   Ct Head Wo Contrast  Result Date: 10/21/2016 CLINICAL DATA:  Confusion. EXAM: CT HEAD WITHOUT CONTRAST TECHNIQUE: Contiguous axial images were obtained from the base of the skull through the vertex without intravenous contrast. COMPARISON:  CT scan of September 24, 2016. FINDINGS: Brain: Mild chronic ischemic white matter disease is noted. No mass effect or midline shift is noted. Ventricular size is within normal limits. There is no evidence of mass lesion, hemorrhage or acute infarction. Vascular: No hyperdense vessel or unexpected calcification. Skull: Normal. Negative for fracture or focal lesion. Sinuses/Orbits: No acute finding. Other: None. IMPRESSION: Mild chronic ischemic white matter disease. No acute intracranial abnormality seen. Electronically Signed   By: Marijo Conception, M.D.   On: 10/21/2016 13:46    Medications:  I have reviewed the patient's current medications. Scheduled: . amLODipine  10 mg Oral Daily  .  atorvastatin  20 mg Oral QHS  . clonazePAM  0.25 mg Oral BID  . conjugated estrogens  1 Applicatorful Vaginal Once per day on Mon Wed Fri  . darifenacin  15 mg Oral Daily  . desmopressin  0.2 mg Oral QHS  . docusate sodium  100 mg Oral BID  . enoxaparin (LOVENOX) injection  40 mg Subcutaneous Q24H  . feeding supplement (ENSURE ENLIVE)  237 mL Oral TID BM  . furosemide  20 mg Oral Daily  . insulin aspart  0-15 Units Subcutaneous TID WC  . insulin aspart  0-5 Units Subcutaneous QHS  . insulin glargine  65 Units Subcutaneous QHS  . iron polysaccharides  150 mg Oral TID  .  levETIRAcetam  500 mg Intravenous Q12H  . loratadine  10 mg Oral Daily  . meropenem  1 g Intravenous Q8H  . metoCLOPramide  5 mg Oral TID AC  . metoprolol succinate  50 mg Oral BID  . nystatin   Topical TID  . pantoprazole  40 mg Oral BID  . polyethylene glycol  17 g Oral Daily  . potassium chloride SA  20 mEq Oral Daily  . senna  1 tablet Oral Daily  . traZODone  50 mg Oral QHS  . vancomycin  1,250 mg Intravenous Q18H  . vitamin E  1,000 Units Oral Daily    Assessment/Plan: Patient with question of seizure activity yesterday.  Patient febrile at the time secondary to UTI versus PNA.  Can not rule out rigors as well.  Further work up recommended.  Recommendations: 1. EEG 2. MRI of the brain without contrast 3. CMET, Magnesium, phosphorus 4. Decrease Keppra to 500mg  BID   LOS: 28 days   Alexis Goodell, MD Neurology [phone] 10/22/2016  11:22 AM

## 2016-10-22 NOTE — Progress Notes (Signed)
Patient ID: Carolyn Valdez, female   DOB: 31-Jan-1941, 76 y.o.   MRN: [id number]   [employer] PROGRESS NOTE  Carolyn Valdez BWG:[id number] DOB: 1941-05-20 DOA: 09/24/2016 PCP: Carolyn Ramsay, MD  HPI/Subjective: Patient remains poorly responsive.  objective  Vitals:   10/22/16 0242 10/22/16 0747  BP: (!) 163/56 (!) 158/63  Pulse: 88 98  Resp:  17  Temp:  99.6 F (37.6 C)    Filed Weights   09/24/16 1159 10/14/16 1432 10/20/16 0954  Weight: 290 lb (131.5 kg) 252 lb 12.8 oz (114.7 kg) 254 lb (115.2 kg)    ROS: Review of Systems  Unable to perform ROS: Acuity of condition  Respiratory: Negative for shortness of breath.   Cardiovascular: Negative for chest pain.  Gastrointestinal: Negative for abdominal pain.   Exam: Physical Exam  HENT:  Nose: No mucosal edema.  Mouth/Throat: No oropharyngeal exudate or posterior oropharyngeal edema.  Eyes: Conjunctivae, EOM and lids are normal. Pupils are equal, round, and reactive to light.  Neck: No JVD present. Carotid bruit is not present. No edema present. No thyroid mass and no thyromegaly present.  Cardiovascular: S1 normal and S2 normal.  Exam reveals no gallop.   Murmur heard.  Systolic murmur is present with a grade of 2/6  Pulses:      Dorsalis pedis pulses are 2+ on the right side, and 2+ on the left side.  Respiratory: No respiratory distress. She has no decreased breath sounds. She has no wheezes. She has no rhonchi. She has no rales.  GI: Soft. Bowel sounds are normal. There is no tenderness.  Musculoskeletal:       Right ankle: She exhibits swelling.       Left ankle: She exhibits swelling.  Lymphadenopathy:    She has no cervical adenopathy.  Neurological: She is unresponsive. No cranial nerve deficit.  Skin: Skin is warm. No rash noted. Nails show no clubbing.  Psychiatric:  Poorly responsive      Data Reviewed: Basic Metabolic Panel:  Recent Labs Lab 10/16/16 0520 10/19/16 1810 10/20/16 0505  10/21/16 0513  NA  --  129*  --  131*  K  --  5.1  --  4.2  CL  --  93*  --  90*  CO2  --  30  --  31  GLUCOSE  --  289*  --  304*  BUN  --  38*  --  28*  CREATININE 0.78 1.31* 1.19* 1.07*  CALCIUM  --  10.2  --  10.3   BNP (last 3 results)  Recent Labs  02/23/16 1617 08/09/16 1550 08/31/16 2145  BNP 181.0* 311.0* 163.0*     CBG:  Recent Labs Lab 10/21/16 1655 10/21/16 2055 10/21/16 2322 10/22/16 0745 10/22/16 1128  GLUCAP 245* 219* 238* 214* 168*     Scheduled Meds: . amLODipine  10 mg Oral Daily  . atorvastatin  20 mg Oral QHS  . clonazePAM  0.25 mg Oral BID  . conjugated estrogens  1 Applicatorful Vaginal Once per day on Mon Wed Fri  . darifenacin  15 mg Oral Daily  . desmopressin  0.2 mg Oral QHS  . docusate sodium  100 mg Oral BID  . enoxaparin (LOVENOX) injection  40 mg Subcutaneous Q24H  . feeding supplement (ENSURE ENLIVE)  237 mL Oral TID BM  . furosemide  20 mg Oral Daily  . insulin aspart  0-15 Units Subcutaneous Q4H  . insulin glargine  67 Units Subcutaneous QHS  .  iron polysaccharides  150 mg Oral TID  . levETIRAcetam  500 mg Intravenous Q12H  . loratadine  10 mg Oral Daily  . meropenem  1 g Intravenous Q8H  . metoCLOPramide  5 mg Oral TID AC  . metoprolol succinate  50 mg Oral BID  . nystatin   Topical TID  . OLANZapine zydis  5 mg Oral QHS  . pantoprazole  40 mg Oral BID  . polyethylene glycol  17 g Oral Daily  . potassium chloride SA  20 mEq Oral Daily  . senna  1 tablet Oral Daily  . traZODone  50 mg Oral QHS  . vitamin E  1,000 Units Oral Daily    Assessment/Plan:  1. Acute encephalopathy patient also has concurrent fever MRI is negative. Appreciate neurology input 2. A severe depression.  Per pychiatry patient was improving now post-ECT therapy patient similar worst 3. Type 2 diabetes- increase lantus  4. Hyperlipidemia unspecified. continue atorvastatin if able to take orally 5. Fever suspect related to ECT therapy no further  fevers I'll stop vancomycin chest x-ray suggestive of possible infiltrate atelectasis UA was abnormal however urine culture shows no growth will continue meropenem for now 6. Essential hypertension on Norvasc  Toprol elevated we will do when necessary IV hydralazine 7. GERD on Protonix 8. History of diabetic gastroparesis on Reglan 9. History of diastolic congestive heart failure and lower extremity edema  10. Weakness. Unable to do much with physical therapy 11. Failure to thrive.  Prognosis poor updated sister seen by palliative care  Code Status:     Code Status Orders        Start     Ordered   10/05/16 1217  Do not attempt resuscitation (DNR)  Continuous    Question Answer Comment  In the event of cardiac or respiratory ARREST Do not call a "code blue"   In the event of cardiac or respiratory ARREST Do not perform Intubation, CPR, defibrillation or ACLS   In the event of cardiac or respiratory ARREST Use medication by any route, position, wound care, and other measures to relive pain and suffering. May use oxygen, suction and manual treatment of airway obstruction as needed for comfort.      10/05/16 1216    Code Status History    Date Active Date Inactive Code Status Order ID Comments User Context   09/24/2016  5:31 PM 10/05/2016 12:16 PM Full Code [id number]  Gladstone Lighter, MD Inpatient   09/01/2016  2:17 AM 09/05/2016  4:28 PM Full Code [id number]  Harvie Bridge, DO Inpatient   08/09/2016  8:27 PM 08/12/2016  8:15 PM Full Code [id number]  Henreitta Leber, MD Inpatient   09/30/2015  2:23 PM 10/07/2015  5:37 PM Full Code [id number]  Loletha Grayer, MD ED   09/05/2015  5:56 PM 09/09/2015  5:38 PM DNR [id number]  Loletha Grayer, MD ED    Advance Directive Documentation   [place] Most Recent Value  Type of Advance Directive  Living will  Pre-existing out of facility DNR order (yellow form or pink MOST form)  No data  "MOST" Form in Place?  No data     Disposition  Plan:As per psychiatry no today states that patient will need inpatient psychiatric Consultants:  Psychiatry  Time spent: 43min spent [place]

## 2016-10-22 NOTE — Progress Notes (Signed)
Daily Progress Note   Patient Name: Carolyn Valdez       Date: 10/22/2016 DOB: [birth year]  Age: 76 y.o. MRN#: [id number] Attending Physician: Dustin Flock, MD Primary Care Physician: Leonel Ramsay, MD Admit Date: 09/24/2016  Reason for Consultation/Follow-up: Establishing goals of care  Subjective: Met with patient and sister at bedside. Patient wakes to voice, alert during our conversation, able to track, and tells me "hello." She is not following commands when asked. She is ill-appearing and pale.   Sister, [name] has met with my colleague Stanton Kidney, NP. I have reviewed these notes. [name] briefly updates me on functional status prior to hospitalization and the course of her declining health since she has been hospitalized. She also speaks of her struggle with "depression and behavioral issues" the majority of her life.   Discussed current medical status and concern of poor nutrition/hydration. I attempted to elicit values and goals of care important to the patient. [name] tells me she has a living will and would not want to be resuscitated, on life support, or with a feeding tube. She tells me if her sister could have a quality of life with a feeding tube, she would want her to have this but seems realistic to the fact that a feeding tube would only extend her life but not give her better quality of life. [name] tells me the patient would not want to live if she was "not able to do for herself."   [name] seems very realistic that her sister many not leave the hospital. She is "not ready to give up yet" and is hopeful that she will show improvement in cognition and be able to eat this weekend.   Discussed the scenario of decline and what if she does not show improvement over the  weekend? Knowing a PEG tube does not fall in line with the patient's wishes, I briefly spoke of comfort measures only and hospice services. [name] is appreciative of our conversation and has my contact information for questions or concerns. I strongly encouraged her to read [employer] [facility] this weekend. She knows I will f/u Monday, 1/8.   Length of Stay: 28  Current Medications: Scheduled Meds:  . amLODipine  10 mg Oral Daily  . atorvastatin  20 mg Oral QHS  . clonazePAM  0.25 mg Oral BID  .  conjugated estrogens  1 Applicatorful Vaginal Once per day on Mon Wed Fri  . darifenacin  15 mg Oral Daily  . desmopressin  0.2 mg Oral QHS  . docusate sodium  100 mg Oral BID  . enoxaparin (LOVENOX) injection  40 mg Subcutaneous Q24H  . feeding supplement (ENSURE ENLIVE)  237 mL Oral TID BM  . furosemide  20 mg Oral Daily  . insulin aspart  0-15 Units Subcutaneous Q4H  . insulin glargine  67 Units Subcutaneous QHS  . iron polysaccharides  150 mg Oral TID  . levETIRAcetam  500 mg Intravenous Q12H  . loratadine  10 mg Oral Daily  . meropenem  1 g Intravenous Q8H  . metoCLOPramide  5 mg Oral TID AC  . metoprolol succinate  50 mg Oral BID  . nystatin   Topical TID  . pantoprazole  40 mg Oral BID  . polyethylene glycol  17 g Oral Daily  . potassium chloride SA  20 mEq Oral Daily  . senna  1 tablet Oral Daily  . traZODone  50 mg Oral QHS  . vitamin E  1,000 Units Oral Daily    Continuous Infusions:  PRN Meds: acetaminophen **OR** acetaminophen, alum & mag hydroxide-simeth, ondansetron **OR** ondansetron (ZOFRAN) IV, polyvinyl alcohol  Physical Exam  Constitutional: She is easily aroused. She appears ill.  Opens eyes to voice and tracks  HENT:  Head: Normocephalic and atraumatic.  Cardiovascular: Regular rhythm and normal heart sounds.   Pulmonary/Chest: Accessory muscle usage present. She has decreased breath sounds.  Abdominal: Soft. Bowel sounds are normal. There is no tenderness.    Neurological: She is alert and easily aroused. She is disoriented.  Tracks. Not following commands but says "hello" to me  Skin: Skin is warm and dry. There is pallor.  Psychiatric: Cognition and memory are impaired. She is noncommunicative. She is inattentive.  Nursing note and vitals reviewed.          Vital Signs: BP (!) 158/63 (BP Location: Left Arm)   Pulse 98   Temp 99.6 F (37.6 C) (Oral)   Resp 17   Ht 5' 7"  (1.702 m)   Wt 115.2 kg (254 lb)   SpO2 98%   BMI 39.78 kg/m  SpO2: SpO2: 98 % O2 Device: O2 Device: Nasal Cannula O2 Flow Rate: O2 Flow Rate (L/min): 2 L/min  Intake/output summary:   Intake/Output Summary (Last 24 hours) at 10/22/16 1551 Last data filed at 10/22/16 0500  Gross per 24 hour  Intake             1480 ml  Output                0 ml  Net             1480 ml   LBM: Last BM Date: 10/21/16 Baseline Weight: Weight: 131.5 kg (290 lb) Most recent weight: Weight: 115.2 kg (254 lb)       Palliative Assessment/Data: PPS 10%   Flowsheet Rows   Flowsheet Row Most Recent Value  Intake Tab  Referral Department  Hospitalist  Unit at Time of Referral  Med/Surg Unit  Palliative Care Primary Diagnosis  Other (Comment)  Date Notified  10/04/16  Palliative Care Type  New Palliative care  Reason for referral  Clarify Goals of Care  Date of Admission  09/24/16  Date first seen by Palliative Care  10/05/16  # of days Palliative referral response time  1 Day(s)  # of days IP  prior to Palliative referral  10  Clinical Assessment  Palliative Performance Scale Score  10%  Psychosocial & Spiritual Assessment  Palliative Care Outcomes  Patient/Family meeting held?  Yes  Who was at the meeting?  patient and sister  [place] regarding hospice, Provided psychosocial or spiritual support, Clarified goals of care      Patient Active Problem List   Diagnosis Date Noted  . DNR (do not resuscitate) 10/05/2016  . Palliative care by  specialist 10/05/2016  . Depression, major, recurrent, severe with psychosis ([place]) 10/05/2016  . Severe major depression, single episode, with psychotic features ([place]) 10/04/2016  . Cellulitis of leg, right 09/29/2016  . Proctitis 09/29/2016  . Hypercapnia 09/29/2016  . Acute urinary retention 09/29/2016  . UTI (urinary tract infection) 09/29/2016  . Weakness 09/29/2016  . Subacute delirium 09/28/2016  . Gastrointestinal hemorrhage   . Diffuse abdominal pain   . Altered mental status 09/24/2016  . Pressure injury of skin 09/02/2016  . Respiratory failure with hypoxia ([place]) 09/01/2016  . Lymphedema 08/16/2016  . CHF (congestive heart failure) ([place]) 08/09/2016  . Congestive heart failure ([place]) 11/25/2015  . Nocturia 11/05/2015  . Urinary frequency 11/05/2015  . Acute respiratory failure with hypoxia ([place]) 09/05/2015  . 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 Incontinence 04/20/2015  . Diabetes mellitus, type 2 ([place]) 04/17/2015  . ESBL (extended spectrum beta-lactamase) producing bacteria infection 04/17/2015  . BP (high blood pressure) 04/17/2015  . Frequent UTI 04/17/2015  . Absence of bladder continence 04/17/2015  . Iron deficiency anemia 03/08/2015  . Absolute anemia 09/25/2014  . Abdominal pain, lower 09/25/2014  . Urge incontinence 02/19/2013  . FOM (frequency of micturition) 02/19/2013  . Bladder infection, chronic 08/11/2012  . Difficult or painful urination 07/26/2012  . Lower urinary tract infection 12/31/2011  . Diabetes mellitus ([place]) 12/31/2011    Palliative Care Assessment & Plan   Patient Profile: 76 y.o. female  with a known history of multiple medical problems including diastolic congestive heart failure, arthritis, bilateral lower extremity edema from venous insufficiency, multiple admissions for recurrent UTIs, hypertension, diabetes mellitus brought in from home after a recent discharge secondary to altered mental status. Per sister, the patient has had psych history  of depression, anxiety, paranoia, and suicidal ideations. She has been seen by psych and received first ECT therapy on 10/20/16. Rapid response called this day due to unresponsiveness, fever, and possible seizure. Chest x-ray suggestive of possible infiltrate/atelectasis. UA was abnormal but cultures show no growth-receiving meropenem. Patient is not working with physical therapy and not eating/too lethargic to eat.   Assessment: Acute encephalopathy Severe depression Weakness Failure to thrive Hx of diastolic congestive heart failure   Recommendations/Plan:  Sister hopeful for improvement over the weekend but does seem realistic that "she may not leave the hospital."  Patient has expressed to sister that she would not want resuscitation, life support, or a PEG tube.   PMT not at [facility] over the weekend but will f/u with sister on Monday, 1/8 and further discuss comfort and hospice if patient is not showing improvement.   Code Status: DNR   Code Status Orders        Start     Ordered   10/05/16 1217  Do not attempt resuscitation (DNR)  Continuous    Question Answer Comment  In the event of cardiac or respiratory ARREST Do not call a "code blue"   In the event of cardiac or respiratory ARREST Do not perform Intubation, CPR,  defibrillation or ACLS   In the event of cardiac or respiratory ARREST Use medication by any route, position, wound care, and other measures to relive pain and suffering. May use oxygen, suction and manual treatment of airway obstruction as needed for comfort.      10/05/16 1216    Code Status History    Date Active Date Inactive Code Status Order ID Comments User Context   09/24/2016  5:31 PM 10/05/2016 12:16 PM Full Code [id number]  Gladstone Lighter, MD Inpatient   09/01/2016  2:17 AM 09/05/2016  4:28 PM Full Code [id number]  Harvie Bridge, DO Inpatient   08/09/2016  8:27 PM 08/12/2016  8:15 PM Full Code [id number]  Henreitta Leber, MD Inpatient   09/30/2015   2:23 PM 10/07/2015  5:37 PM Full Code [id number]  Loletha Grayer, MD ED   09/05/2015  5:56 PM 09/09/2015  5:38 PM DNR [id number]  Loletha Grayer, MD ED    Advance Directive Documentation   [place] Most Recent Value  Type of Advance Directive  Living will  Pre-existing out of facility DNR order (yellow form or pink MOST form)  No data  "MOST" Form in Place?  No data       Prognosis:   Unable to determine  Discharge Planning:  To Be Determined  Care plan was discussed with sister, SW/CM, RN, Dr. Posey Pronto, and Dr. Weber Cooks  Thank you for allowing the Palliative Medicine Team to assist in the care of this patient.   Time In: 1400 Time Out: 1515 Total Time 12mn Prolonged Time Billed  yes      Greater than 50%  of this time was spent counseling and coordinating care related to the above assessment and plan.  MIhor Dow FNP-C Palliative Medicine Team  Phone: [phone]: [phone] Please contact Palliative Medicine Team phone at [phone] questions and concerns.

## 2016-10-22 NOTE — Progress Notes (Signed)
PT Cancellation Note  Patient Details Name: Carolyn Valdez MRN: [id number] DOB: May 10, 1941   Cancelled Treatment:    Reason Eval/Treat Not Completed: Other (comment) . Chart reviewed and spoke to RN. Pt currently out of room for testing at this time. Pt barely responsive, not communicating and only opening eyes. Pt not appropriate for therapy this date. Will hold attempt. Will re-visit next week to see if status improves and is appropriate for therapy intervention.  Mennie Spiller 10/22/2016, 11:40 AM Greggory Stallion, PT, DPT [phone]

## 2016-10-23 DIAGNOSIS — F323 Major depressive disorder, single episode, severe with psychotic features: Secondary | ICD-10-CM

## 2016-10-23 LAB — COMPREHENSIVE METABOLIC PANEL
ALT: 14 U/L (ref 14–54)
AST: 19 U/L (ref 15–41)
Albumin: 2.7 g/dL — ABNORMAL LOW (ref 3.5–5.0)
Alkaline Phosphatase: 43 U/L (ref 38–126)
Anion gap: 5 (ref 5–15)
BUN: 39 mg/dL — AB (ref 6–20)
CO2: 35 mmol/L — ABNORMAL HIGH (ref 22–32)
Calcium: 9.3 mg/dL (ref 8.9–10.3)
Chloride: 96 mmol/L — ABNORMAL LOW (ref 101–111)
Creatinine, Ser: 1.18 mg/dL — ABNORMAL HIGH (ref 0.44–1.00)
GFR calc Af Amer: 52 mL/min — ABNORMAL LOW (ref >60)
GFR, EST NON AFRICAN AMERICAN: 45 mL/min — AB (ref >60)
Glucose, Bld: 93 mg/dL (ref 65–99)
POTASSIUM: 3.3 mmol/L — AB (ref 3.5–5.1)
Sodium: 136 mmol/L (ref 135–145)
Total Bilirubin: 0.9 mg/dL (ref 0.3–1.2)
Total Protein: 6 g/dL — ABNORMAL LOW (ref 6.5–8.1)

## 2016-10-23 LAB — GLUCOSE, CAPILLARY
GLUCOSE-CAPILLARY: 142 mg/dL — AB (ref 65–99)
GLUCOSE-CAPILLARY: 163 mg/dL — AB (ref 65–99)
GLUCOSE-CAPILLARY: 181 mg/dL — AB (ref 65–99)
GLUCOSE-CAPILLARY: 90 mg/dL (ref 65–99)
GLUCOSE-CAPILLARY: 96 mg/dL (ref 65–99)
Glucose-Capillary: 120 mg/dL — ABNORMAL HIGH (ref 65–99)

## 2016-10-23 LAB — CBC
HCT: 34.1 % — ABNORMAL LOW (ref 35.0–47.0)
Hemoglobin: 11.6 g/dL — ABNORMAL LOW (ref 12.0–16.0)
MCH: 27.9 pg (ref 26.0–34.0)
MCHC: 33.9 g/dL (ref 32.0–36.0)
MCV: 82.2 fL (ref 80.0–100.0)
PLATELETS: 284 10*3/uL (ref 150–440)
RBC: 4.14 MIL/uL (ref 3.80–5.20)
RDW: 16.5 % — ABNORMAL HIGH (ref 11.5–14.5)
WBC: 7.1 10*3/uL (ref 3.6–11.0)

## 2016-10-23 LAB — PHOSPHORUS: PHOSPHORUS: 3.8 mg/dL (ref 2.5–4.6)

## 2016-10-23 LAB — MAGNESIUM: MAGNESIUM: 1.9 mg/dL (ref 1.7–2.4)

## 2016-10-23 NOTE — Progress Notes (Signed)
Physical Therapy Treatment Patient Details Name: Carolyn Valdez MRN: [id number] DOB: June 07, 1941 Today's Date: 10/23/2016    History of Present Illness Pt is a 76 y/o F who presents to the hospital due to AMS, confusion, urinary retention.  There is concern for possible severe constipation related proctitis.  Admiting dx: acute encephalopathy due to RLE cellulitis, stercoral proctitis.  Pt's PMH includes CHF, BLE edema, venous insufficiency, recurrent UTIs, obesity, SVT, TKA,  per pt L sciatica.    PT Comments    Pt agreeable to PT with gentle encouragement. Pt demonstrates improved participation/movement with all supine exercises; however, pt does take increased time to complete 1 set of 10 repetitions. Pt often stops/stares even while be given instruction and encouragement to continue. Alternating affect from flat at times to communicative and smile/laught expressions. Pt wishes to sit to the edge of the bed post exercise. Unable with 1 person assist; +3 with Mod to Max assist; pt alternates between actively assisting and actively retropulsive push, but unaware/unable to correct on cue. Pt returned to bed in nursing assistants care for personal hygiene. Continue PT to progress strength, range and endurance to improve functional mobility to allow for up in/out of bed.   Follow Up Recommendations  SNF (possibly LTC)     Equipment Recommendations       Recommendations for Other Services       Precautions / Restrictions Restrictions Weight Bearing Restrictions: No    Mobility  Bed Mobility Overal bed mobility: Needs Assistance Bed Mobility: Sit to Supine;Supine to Sit     Supine to sit: Mod assist;Max assist;HOB elevated (+3) Sit to supine: Max assist (+3)   General bed mobility comments: Pt does wish to try and sit; gives effort inconsistently with pushing backward towards lying down. Unaware/slow to correct retro pushing even with heavy cueing  Transfers                     Ambulation/Gait                 Stairs            Wheelchair Mobility    Modified Rankin (Stroke Patients Only)       Balance Overall balance assessment: Needs assistance             Standing balance comment: Does not quite attain full sit with feet to the floor; holds 1 rail and bed with heavy retropulsive push. Tolerates < 30 seconds                    Cognition Arousal/Alertness: Awake/alert Behavior During Therapy: WFL for tasks assessed/performed Overall Cognitive Status: No family/caregiver present to determine baseline cognitive functioning                      Exercises General Exercises - Lower Extremity Ankle Circles/Pumps: AROM;Both;10 reps;Supine Quad Sets: Strengthening;Both;10 reps;Supine Gluteal Sets: Strengthening;Both;10 reps;Supine Short Arc Quad: AAROM;AROM;Both;10 reps;Supine Heel Slides: AAROM;Both;10 reps;Supine Hip ABduction/ADduction: AAROM;Both;10 reps;Supine    General Comments        Pertinent Vitals/Pain Pain Assessment: Faces Faces Pain Scale: Hurts little more Pain Location: back/shoulders with sit Pain Intervention(s): Limited activity within patient's tolerance;Repositioned    Home Living                      Prior Function            PT Goals (current goals can now be found  in the care plan section) Progress towards PT goals: Progressing toward goals (slowly)    Frequency    Min 2X/week      PT Plan Discharge plan needs to be updated    Co-evaluation             End of Session Equipment Utilized During Treatment: Oxygen Activity Tolerance: Patient limited by pain;Other (comment) (weakness, lengthy immobility, cognition) Patient left: in bed;with nursing/sitter in room     Time: 8335-8251 PT Time Calculation (min) (ACUTE ONLY): 31 min  Charges:  $Therapeutic Exercise: 8-22 mins $Therapeutic Activity: 8-22 mins                    G CodesLarae Grooms,  PTA 10/23/2016, 5:14 PM

## 2016-10-23 NOTE — Progress Notes (Signed)
Patient ID: Carolyn Valdez, female   DOB: May 26, 1941, 76 y.o.   MRN: [id number]   [facility] PROGRESS NOTE  Carolyn Valdez [id number] DOB: [birth year] DOA: 09/24/2016 PCP: Leonel Ramsay, MD  HPI/Subjective:  alert, awake, according to the patient became alert since last  Night. communicate today morning. Denies any complaints.  objective  Vitals:   10/23/16 0450 10/23/16 0835  BP: (!) 119/52 (!) 156/54  Pulse: 61 80  Resp: 18 18  Temp: 98 F (36.7 C) 97.9 F (36.6 C)    Filed Weights   09/24/16 1159 10/14/16 1432 10/20/16 0954  Weight: 131.5 kg (290 lb) 114.7 kg (252 lb 12.8 oz) 115.2 kg (254 lb)    ROS: Review of Systems  Constitutional: Negative for chills and fever.  HENT: Negative for hearing loss and tinnitus.   Eyes: Negative for blurred vision, double vision and photophobia.  Respiratory: Negative for cough, hemoptysis and shortness of breath.   Cardiovascular: Negative for chest pain, palpitations, orthopnea and leg swelling.  Gastrointestinal: Negative for abdominal pain, diarrhea, heartburn, nausea and vomiting.  Genitourinary: Negative for dysuria and urgency.  Musculoskeletal: Negative for myalgias and neck pain.  Skin: Negative for itching and rash.  Neurological: Negative for dizziness, tingling, focal weakness, seizures, weakness and headaches.  Endo/Heme/Allergies: Negative for environmental allergies. Does not bruise/bleed easily.  Psychiatric/Behavioral: Negative for depression, memory loss, substance abuse and suicidal ideas. The patient does not have insomnia.    Exam: Physical Exam  HENT:  Nose: No mucosal edema.  Mouth/Throat: No oropharyngeal exudate or posterior oropharyngeal edema.  Eyes: Conjunctivae, EOM and lids are normal. Pupils are equal, round, and reactive to light.  Neck: No JVD present. Carotid bruit is not present. No edema present. No thyroid mass and no thyromegaly present.  Cardiovascular: S1 normal and S2 normal.  Exam  reveals no gallop.   Murmur heard.  Systolic murmur is present with a grade of 2/6  Pulses:      Dorsalis pedis pulses are 2+ on the right side, and 2+ on the left side.  Respiratory: No respiratory distress. She has no decreased breath sounds. She has no wheezes. She has no rhonchi. She has no rales.  GI: Soft. Bowel sounds are normal. There is no tenderness.  Musculoskeletal:       Right ankle: She exhibits swelling.       Left ankle: She exhibits swelling.  Lymphadenopathy:    She has no cervical adenopathy.  Neurological: She is alert. No cranial nerve deficit.  Skin: Skin is warm. No rash noted. Nails show no clubbing.      Data Reviewed: Basic Metabolic Panel:  Recent Labs Lab 10/19/16 1810 10/20/16 0505 10/21/16 0513 10/23/16 0507  NA 129*  --  131* 136  K 5.1  --  4.2 3.3*  CL 93*  --  90* 96*  CO2 30  --  31 35*  GLUCOSE 289*  --  304* 93  BUN 38*  --  28* 39*  CREATININE 1.31* 1.19* 1.07* 1.18*  CALCIUM 10.2  --  10.3 9.3  MG  --   --   --  1.9  PHOS  --   --   --  3.8   BNP (last 3 results)  Recent Labs  02/23/16 1617 08/09/16 1550 08/31/16 2145  BNP 181.0* 311.0* 163.0*     CBG:  Recent Labs Lab 10/22/16 2007 10/23/16 0003 10/23/16 0447 10/23/16 0742 10/23/16 1128  GLUCAP 238* 142* 90 96 163*  Scheduled Meds: . amLODipine  10 mg Oral Daily  . atorvastatin  20 mg Oral QHS  . clonazePAM  0.25 mg Oral BID  . conjugated estrogens  1 Applicatorful Vaginal Once per day on Mon Wed Fri  . darifenacin  15 mg Oral Daily  . desmopressin  0.2 mg Oral QHS  . docusate sodium  100 mg Oral BID  . enoxaparin (LOVENOX) injection  40 mg Subcutaneous Q24H  . feeding supplement (ENSURE ENLIVE)  237 mL Oral TID BM  . furosemide  20 mg Oral Daily  . insulin aspart  0-15 Units Subcutaneous Q4H  . insulin glargine  67 Units Subcutaneous QHS  . iron polysaccharides  150 mg Oral TID  . levETIRAcetam  500 mg Intravenous Q12H  . loratadine  10 mg Oral  Daily  . meropenem  1 g Intravenous Q8H  . metoCLOPramide  5 mg Oral TID AC  . metoprolol succinate  50 mg Oral BID  . nystatin   Topical TID  . OLANZapine zydis  5 mg Oral QHS  . pantoprazole  40 mg Oral BID  . polyethylene glycol  17 g Oral Daily  . potassium chloride SA  20 mEq Oral Daily  . senna  1 tablet Oral Daily  . traZODone  50 mg Oral QHS  . vitamin E  1,000 Units Oral Daily    Assessment/Plan:  1. Acute encephalopathy patient also has concurrent fever MRI is negative. Appreciate neurology input Acute encephalopathy likely secondary to sepsis and urine infection:had rigors,neeurology recommended continuing the Keppra.  2. A severe depression.  Per pychiatry . Started on Zyprexa. 3.  4. Type 2 diabetes-controlled, continue Lantus   5. Hyperlipidemia unspecified. continue atorvastatin . Continue by mouth daily.   6. Fever suspect related to ECT therapy no further fevers .stopped  vancomycin chest x-ray suggestive of possible infiltrate atelectasis UA was abnormal however urine culture shows no growth ;will continue meropenem for now 7.  8. Essential hypertension ;on Norvasc  Toprol. if elevated we will do when necessary IV hydralazine. 9.  10. GERD on Protonix. 11.  12. History of diabetic gastroparesis on Reglan. 13.  14. History of diastolic congestive heart failure and lower extremity edema  15.  16. Weakness. Unable to do much with physical therapy. 17.  Failure to thrive.   prognosis  Poor   secondary to her advanced age, multiple medical problems.   Code Status:     Code Status Orders        Start     Ordered   10/05/16 1217  Do not attempt resuscitation (DNR)  Continuous    Question Answer Comment  In the event of cardiac or respiratory ARREST Do not call a "code blue"   In the event of cardiac or respiratory ARREST Do not perform Intubation, CPR, defibrillation or ACLS   In the event of cardiac or respiratory ARREST Use medication by any route,  position, wound care, and other measures to relive pain and suffering. May use oxygen, suction and manual treatment of airway obstruction as needed for comfort.      10/05/16 1216    Code Status History    Date Active Date Inactive Code Status Order ID Comments User Context   09/24/2016  5:31 PM 10/05/2016 12:16 PM Full Code [id number]  Gladstone Lighter, MD Inpatient   09/01/2016  2:17 AM 09/05/2016  4:28 PM Full Code [id number]  Harvie Bridge, DO Inpatient   08/09/2016  8:27 PM 08/12/2016  8:15 PM  Full Code [id number]  Henreitta Leber, MD Inpatient   09/30/2015  2:23 PM 10/07/2015  5:37 PM Full Code [id number]  Loletha Grayer, MD ED   09/05/2015  5:56 PM 09/09/2015  5:38 PM DNR [id number]  Loletha Grayer, MD ED    Advance Directive Documentation   [place] Most Recent Value  Type of Advance Directive  Living will  Pre-existing out of facility DNR order (yellow form or pink MOST form)  No data  "MOST" Form in Place?  No data     Disposition Plan:As per psychiatry no today states that patient will need inpatient psychiatric Consultants:  Psychiatry  Time spent: 77min spent [facility]

## 2016-10-23 NOTE — Progress Notes (Signed)
Subjective: No reported seizures overnight. Mental status appears to have improved.   Objective: Current vital signs: BP (!) 156/54 (BP Location: Left Arm) Comment: nurse Marliss Czar) notified  Pulse 80   Temp 97.9 F (36.6 C) (Oral)   Resp 18   Ht 5\' 7"  (1.702 m)   Wt 115.2 kg (254 lb)   SpO2 99%   BMI 39.78 kg/m  Vital signs in last 24 hours: Temp:  [97.9 F (36.6 C)-99 F (37.2 C)] 97.9 F (36.6 C) (01/06 0835) Pulse Rate:  [61-96] 80 (01/06 0835) Resp:  [18] 18 (01/06 0835) BP: (119-156)/(50-54) 156/54 (01/06 0835) SpO2:  [98 %-99 %] 99 % (01/06 0835)  Intake/Output from previous day: 01/05 0701 - 01/06 0700 In: 395 [P.O.:240; IV Piggyback:155] Out: 0  Intake/Output this shift: No intake/output data recorded. Nutritional status: Diet - low sodium heart healthy Diet Carb Modified Fluid consistency: Thin; Room service appropriate? Yes  Neurologic Exam: Mental Status: Patient resting but awakened with light sternal rub.  No speech but mostly groaning.  Follows some simple commands.   Cranial Nerves: II: Discs flat bilaterally; Blinks to bilateral confrontation, pupils equal, round, reactive to light and accommodation III,IV, VI: ptosis not present, extra-ocular motions intact bilaterally V,VII: right facial droop, facial light touch sensation normal bilaterally VIII: hearing normal bilaterally IX,X: gag reflex present XI: unable to test XII: unable to test Motor: Patient moves all extremities but does not cooperate for formal testing.  Gives a weak hand grip bilaterally to command.     Lab Results: Basic Metabolic Panel:  Recent Labs Lab 10/19/16 1810 10/20/16 0505 10/21/16 0513 10/23/16 0507  NA 129*  --  131* 136  K 5.1  --  4.2 3.3*  CL 93*  --  90* 96*  CO2 30  --  31 35*  GLUCOSE 289*  --  304* 93  BUN 38*  --  28* 39*  CREATININE 1.31* 1.19* 1.07* 1.18*  CALCIUM 10.2  --  10.3 9.3  MG  --   --   --  1.9  PHOS  --   --   --  3.8    Liver Function  Tests:  Recent Labs Lab 10/23/16 0507  AST 19  ALT 14  ALKPHOS 43  BILITOT 0.9  PROT 6.0*  ALBUMIN 2.7*   No results for input(s): LIPASE, AMYLASE in the last 168 hours. No results for input(s): AMMONIA in the last 168 hours.  CBC:  Recent Labs Lab 10/21/16 0513 10/23/16 0507  WBC 11.4* 7.1  HGB 12.8 11.6*  HCT 37.4 34.1*  MCV 81.4 82.2  PLT 361 284    Cardiac Enzymes: No results for input(s): CKTOTAL, CKMB, CKMBINDEX, TROPONINI in the last 168 hours.  Lipid Panel: No results for input(s): CHOL, TRIG, HDL, CHOLHDL, VLDL, LDLCALC in the last 168 hours.  CBG:  Recent Labs Lab 10/22/16 1638 10/22/16 2007 10/23/16 0003 10/23/16 0447 10/23/16 0742  GLUCAP 177* 238* 142* 90 96    Microbiology: Results for orders placed or performed during the hospital encounter of 09/24/16  Urine culture     Status: None   Collection Time: 09/24/16 12:15 PM  Result Value Ref Range Status   Specimen Description URINE, RANDOM  Final   Special Requests NONE  Final   Culture NO GROWTH Performed at [facility]   Final   Report Status 09/26/2016 FINAL  Final  CULTURE, BLOOD (ROUTINE X 2) w Reflex to ID Panel     Status: None  Collection Time: 09/24/16  6:35 PM  Result Value Ref Range Status   Specimen Description BLOOD  RIGHT AC  Final   Special Requests   Final    BOTTLES DRAWN AEROBIC AND ANAEROBIC AER 8ML ANA 9ML   Culture NO GROWTH 5 DAYS  Final   Report Status 09/29/2016 FINAL  Final  CULTURE, BLOOD (ROUTINE X 2) w Reflex to ID Panel     Status: None   Collection Time: 09/24/16  7:46 PM  Result Value Ref Range Status   Specimen Description BLOOD  RIGHT HAND  Final   Special Requests   Final    BOTTLES DRAWN AEROBIC AND ANAEROBIC  AER 3CC ANA 3ML   Culture NO GROWTH 5 DAYS  Final   Report Status 10/08/2016 FINAL  Final  Urine culture     Status: Abnormal   Collection Time: 10/21/16  9:06 AM  Result Value Ref Range Status   Specimen Description URINE, RANDOM   Final   Special Requests NONE  Final   Culture MULTIPLE SPECIES PRESENT, SUGGEST RECOLLECTION (A)  Final   Report Status 10/22/2016 FINAL  Final    Coagulation Studies: No results for input(s): LABPROT, INR in the last 72 hours.  Imaging: Ct Head Wo Contrast  Result Date: 10/21/2016 CLINICAL DATA:  Confusion. EXAM: CT HEAD WITHOUT CONTRAST TECHNIQUE: Contiguous axial images were obtained from the base of the skull through the vertex without intravenous contrast. COMPARISON:  CT scan of September 24, 2016. FINDINGS: Brain: Mild chronic ischemic white matter disease is noted. No mass effect or midline shift is noted. Ventricular size is within normal limits. There is no evidence of mass lesion, hemorrhage or acute infarction. Vascular: No hyperdense vessel or unexpected calcification. Skull: Normal. Negative for fracture or focal lesion. Sinuses/Orbits: No acute finding. Other: None. IMPRESSION: Mild chronic ischemic white matter disease. No acute intracranial abnormality seen. Electronically Signed   By: Marijo Conception, M.D.   On: 10/21/2016 13:46   Mr Brain Wo Contrast  Result Date: 10/22/2016 CLINICAL DATA:  Seizure.  Fever. EXAM: MRI HEAD WITHOUT CONTRAST TECHNIQUE: Multiplanar, multiecho pulse sequences of the brain and surrounding structures were obtained without intravenous contrast. COMPARISON:  CT of the head without contrast 10/21/2016. MRI brain 09/30/2016 FINDINGS: Brain: Moderate diffuse periventricular and subcortical T2 changes again seen bilaterally. Remote lacunar infarcts are present within the basal ganglia. Dedicated imaging of the temporal lobes demonstrate symmetric T2 changes within the hippocampi. No discrete mass lesion is present. Vascular: The flow is present in the major intracranial arteries. Skull and upper cervical spine: The skullbase is within normal limits. The craniocervical junction is unremarkable. Midline sagittal structures are within normal limits. Sinuses/Orbits:  Minimal fluid is present in the right maxillary sinus. There is mild mucosal thickening along the floor of the left maxillary sinus. The paranasal sinuses are otherwise clear. The mastoid air cells are clear. IMPRESSION: 1. No acute or focal lesion to explain seizures. 2. Moderate generalized atrophy and white matter disease. This likely reflects the sequela of chronic microvascular ischemia. Electronically Signed   By: San Morelle M.D.   On: 10/22/2016 12:54    Medications:  I have reviewed the patient's current medications. Scheduled: . amLODipine  10 mg Oral Daily  . atorvastatin  20 mg Oral QHS  . clonazePAM  0.25 mg Oral BID  . conjugated estrogens  1 Applicatorful Vaginal Once per day on Mon Wed Fri  . darifenacin  15 mg Oral Daily  . desmopressin  0.2 mg Oral QHS  . docusate sodium  100 mg Oral BID  . enoxaparin (LOVENOX) injection  40 mg Subcutaneous Q24H  . feeding supplement (ENSURE ENLIVE)  237 mL Oral TID BM  . furosemide  20 mg Oral Daily  . insulin aspart  0-15 Units Subcutaneous Q4H  . insulin glargine  67 Units Subcutaneous QHS  . iron polysaccharides  150 mg Oral TID  . levETIRAcetam  500 mg Intravenous Q12H  . loratadine  10 mg Oral Daily  . meropenem  1 g Intravenous Q8H  . metoCLOPramide  5 mg Oral TID AC  . metoprolol succinate  50 mg Oral BID  . nystatin   Topical TID  . OLANZapine zydis  5 mg Oral QHS  . pantoprazole  40 mg Oral BID  . polyethylene glycol  17 g Oral Daily  . potassium chloride SA  20 mEq Oral Daily  . senna  1 tablet Oral Daily  . traZODone  50 mg Oral QHS  . vitamin E  1,000 Units Oral Daily    Assessment/Plan: Patient with question of seizure activity yesterday.  Patient febrile at the time secondary to UTI versus PNA.  Can not rule out rigors as well.    Believe mentation has improved, pt follows commands. No seizure overnight EEG drowsy state but no seizure Con't Keppra

## 2016-10-24 LAB — GLUCOSE, CAPILLARY
GLUCOSE-CAPILLARY: 198 mg/dL — AB (ref 65–99)
GLUCOSE-CAPILLARY: 208 mg/dL — AB (ref 65–99)
GLUCOSE-CAPILLARY: 238 mg/dL — AB (ref 65–99)
GLUCOSE-CAPILLARY: 308 mg/dL — AB (ref 65–99)
GLUCOSE-CAPILLARY: 85 mg/dL (ref 65–99)
Glucose-Capillary: 97 mg/dL (ref 65–99)

## 2016-10-24 MED ORDER — SODIUM CHLORIDE 0.9% FLUSH
10.0000 mL | Freq: Two times a day (BID) | INTRAVENOUS | Status: DC
  Administered 2016-10-24: 10 mL
  Administered 2016-10-25: 20 mL
  Administered 2016-10-25: 10 mL

## 2016-10-24 MED ORDER — SODIUM CHLORIDE 0.9% FLUSH: 10.0000 mL | INTRAVENOUS | Status: DC | PRN

## 2016-10-24 NOTE — Progress Notes (Signed)
Patient has been more alert today, still disoriented to time and situation, but has been having normal conversations with me.  VSS.  PICC in place and flushes well.  Patient has been eating small amounts of the meal trays but has been drinking more fluids today.  Voided once, no BM today even though she felt she had to a few times.  No significant changes.

## 2016-10-24 NOTE — Progress Notes (Signed)
Patient ID: Carolyn Valdez, female   DOB: 07/27/1941, 76 y.o.   MRN: [id number]   [facility] PROGRESS NOTE  Carolyn Valdez QIH:[id number] DOB: 07-05-1941 DOA: 09/24/2016 PCP: Leonel Ramsay, MD  HPI/Subjective:  alert ,awake, oriented. Denies any complaints.   objective  Vitals:   10/24/16 0758 10/24/16 1237  BP: (!) 141/49 (!) 137/53  Pulse: 72 77  Resp:  20  Temp:  98.1 F (36.7 C)    Filed Weights   09/24/16 1159 10/14/16 1432 10/20/16 0954  Weight: 131.5 kg (290 lb) 114.7 kg (252 lb 12.8 oz) 115.2 kg (254 lb)    ROS: Review of Systems  Constitutional: Negative for chills and fever.  HENT: Negative for hearing loss and tinnitus.   Eyes: Negative for blurred vision, double vision and photophobia.  Respiratory: Negative for cough, hemoptysis and shortness of breath.   Cardiovascular: Negative for chest pain, palpitations, orthopnea and leg swelling.  Gastrointestinal: Negative for abdominal pain, diarrhea, heartburn, nausea and vomiting.  Genitourinary: Negative for dysuria and urgency.  Musculoskeletal: Negative for myalgias and neck pain.  Skin: Negative for itching and rash.  Neurological: Negative for dizziness, tingling, focal weakness, seizures, weakness and headaches.  Endo/Heme/Allergies: Negative for environmental allergies. Does not bruise/bleed easily.  Psychiatric/Behavioral: Negative for depression, memory loss, substance abuse and suicidal ideas. The patient does not have insomnia.    Exam: Physical Exam  HENT:  Nose: No mucosal edema.  Mouth/Throat: No oropharyngeal exudate or posterior oropharyngeal edema.  Eyes: Conjunctivae, EOM and lids are normal. Pupils are equal, round, and reactive to light.  Neck: No JVD present. Carotid bruit is not present. No edema present. No thyroid mass and no thyromegaly present.  Cardiovascular: S1 normal and S2 normal.  Exam reveals no gallop.   Murmur heard.  Systolic murmur is present with a grade of 2/6   Pulses:      Dorsalis pedis pulses are 2+ on the right side, and 2+ on the left side.  Respiratory: No respiratory distress. She has no decreased breath sounds. She has no wheezes. She has no rhonchi. She has no rales.  GI: Soft. Bowel sounds are normal. There is no tenderness.  Musculoskeletal:       Right ankle: She exhibits swelling.       Left ankle: She exhibits swelling.  Lymphadenopathy:    She has no cervical adenopathy.  Neurological: She is alert. No cranial nerve deficit.  Skin: Skin is warm. No rash noted. Nails show no clubbing.      Data Reviewed: Basic Metabolic Panel:  Recent Labs Lab 10/19/16 1810 10/20/16 0505 10/21/16 0513 10/23/16 0507  NA 129*  --  131* 136  K 5.1  --  4.2 3.3*  CL 93*  --  90* 96*  CO2 30  --  31 35*  GLUCOSE 289*  --  304* 93  BUN 38*  --  28* 39*  CREATININE 1.31* 1.19* 1.07* 1.18*  CALCIUM 10.2  --  10.3 9.3  MG  --   --   --  1.9  PHOS  --   --   --  3.8   BNP (last 3 results)  Recent Labs  02/23/16 1617 08/09/16 1550 08/31/16 2145  BNP 181.0* 311.0* 163.0*     CBG:  Recent Labs Lab 10/23/16 2048 10/24/16 0017 10/24/16 0451 10/24/16 0754 10/24/16 1137  GLUCAP 181* 198* 97 85 208*     Scheduled Meds: . amLODipine  10 mg Oral Daily  .  atorvastatin  20 mg Oral QHS  . clonazePAM  0.25 mg Oral BID  . conjugated estrogens  1 Applicatorful Vaginal Once per day on Mon Wed Fri  . darifenacin  15 mg Oral Daily  . desmopressin  0.2 mg Oral QHS  . docusate sodium  100 mg Oral BID  . enoxaparin (LOVENOX) injection  40 mg Subcutaneous Q24H  . feeding supplement (ENSURE ENLIVE)  237 mL Oral TID BM  . furosemide  20 mg Oral Daily  . insulin aspart  0-15 Units Subcutaneous Q4H  . insulin glargine  67 Units Subcutaneous QHS  . iron polysaccharides  150 mg Oral TID  . levETIRAcetam  500 mg Intravenous Q12H  . loratadine  10 mg Oral Daily  . meropenem  1 g Intravenous Q8H  . metoCLOPramide  5 mg Oral TID AC  .  metoprolol succinate  50 mg Oral BID  . nystatin   Topical TID  . OLANZapine zydis  5 mg Oral QHS  . pantoprazole  40 mg Oral BID  . polyethylene glycol  17 g Oral Daily  . potassium chloride SA  20 mEq Oral Daily  . senna  1 tablet Oral Daily  . traZODone  50 mg Oral QHS  . vitamin E  1,000 Units Oral Daily    Assessment/Plan:  1. Acute encephalopathy patient also has concurrent fever ,MRI is negative. Appreciate neurology input Acute encephalopathy likely secondary to sepsis and urine infection:had rigors,neeurology recommended continuing the Keppra. Encephalopathy;, patient became alert, awake, for the most part she is participating in the conversations. Physical therapy recommended skilled nursing facility placement.  2.  severe depression.  Per pychiatry . Started on Zyprexa.. 3.  4. Type 2 diabetes-controlled, continue Lantus,ssi with coverage.   5. Hyperlipidemia unspecified. continue atorvastatin . Continue by mouth daily.   6. Fever suspect related to ECT therapy no further fevers .stopped  vancomycin chest x-ray suggestive of possible infiltrate atelectasis UA was abnormal however urine culture shows no growth ;will continue meropenem for now. 7.  8. Essential hypertension ;on Norvasc  Toprol. if elevated we will do when necessary IV hydralazine. 9.  10. GERD on Protonix. 11.  12. History of diabetic gastroparesis; on Reglan. 13.  14. History of diastolic congestive heart failure and lower extremity edema  15.  16. Weakness.participated  physical therapy, physical therapy recommended skilled nursing facility placement. 17.  Failure to thrive. Intake improving.   prognosis  Poor   secondary to her advanced age, multiple medical problems.   Code Status:     Code Status Orders        Start     Ordered   10/05/16 1217  Do not attempt resuscitation (DNR)  Continuous    Question Answer Comment  In the event of cardiac or respiratory ARREST Do not call a "code  blue"   In the event of cardiac or respiratory ARREST Do not perform Intubation, CPR, defibrillation or ACLS   In the event of cardiac or respiratory ARREST Use medication by any route, position, wound care, and other measures to relive pain and suffering. May use oxygen, suction and manual treatment of airway obstruction as needed for comfort.      10/05/16 1216    Code Status History    Date Active Date Inactive Code Status Order ID Comments User Context   09/24/2016  5:31 PM 10/05/2016 12:16 PM Full Code [id number]  Gladstone Lighter, MD Inpatient   09/01/2016  2:17 AM 09/05/2016  4:28 PM  Full Code [id number]  Harvie Bridge, DO Inpatient   08/09/2016  8:27 PM 08/12/2016  8:15 PM Full Code [id number]  Henreitta Leber, MD Inpatient   09/30/2015  2:23 PM 10/07/2015  5:37 PM Full Code [id number]  Loletha Grayer, MD ED   09/05/2015  5:56 PM 09/09/2015  5:38 PM DNR [id number]  Loletha Grayer, MD ED    Advance Directive Documentation   [place] Most Recent Value  Type of Advance Directive  Living will  Pre-existing out of facility DNR order (yellow form or pink MOST form)  No data  "MOST" Form in Place?  No data     Disposition Plan:As per psychiatry no today states that patient will need inpatient psychiatric Consultants:  Psychiatry  Time spent: 25min spent [facility]

## 2016-10-24 NOTE — Plan of Care (Signed)
Problem: Education: Goal: Knowledge of [facility] Education information/materials will improve Outcome: Not Progressing Patient is confused.  Problem: Health Behavior/Discharge Planning: Goal: Ability to manage health-related needs will improve Outcome: Not Progressing Patient is confused.  Problem: Activity: Goal: Risk for activity intolerance will decrease Outcome: Not Progressing Patient has weakness and is not ambulating.

## 2016-10-25 LAB — GLUCOSE, CAPILLARY
GLUCOSE-CAPILLARY: 178 mg/dL — AB (ref 65–99)
GLUCOSE-CAPILLARY: 180 mg/dL — AB (ref 65–99)
GLUCOSE-CAPILLARY: 266 mg/dL — AB (ref 65–99)
GLUCOSE-CAPILLARY: 334 mg/dL — AB (ref 65–99)
Glucose-Capillary: 259 mg/dL — ABNORMAL HIGH (ref 65–99)
Glucose-Capillary: 308 mg/dL — ABNORMAL HIGH (ref 65–99)

## 2016-10-25 MED ORDER — ENSURE ENLIVE PO LIQD: 237.0000 mL | Freq: Three times a day (TID) | ORAL | Status: DC

## 2016-10-25 MED ORDER — INSULIN GLARGINE 100 UNIT/ML ~~LOC~~ SOLN
72.0000 [IU] | Freq: Every day | SUBCUTANEOUS | Status: DC
Start: 2016-10-25 — End: 2016-10-26
  Administered 2016-10-25: 72 [IU] via SUBCUTANEOUS
  Filled 2016-10-25 (×2): qty 0.72

## 2016-10-25 MED ORDER — GLUCERNA SHAKE PO LIQD: 237.0000 mL | Freq: Three times a day (TID) | ORAL | Status: DC

## 2016-10-25 MED ORDER — LEVETIRACETAM 250 MG PO TABS
250.0000 mg | ORAL_TABLET | Freq: Two times a day (BID) | ORAL | Status: DC
  Administered 2016-10-25 – 2016-10-26 (×2): 250 mg via ORAL
  Filled 2016-10-25 (×3): qty 1

## 2016-10-25 NOTE — Progress Notes (Signed)
Pharmacy Antibiotic Note  Carolyn Valdez is a 76 y.o. female admitted on 09/24/2016 with UTI.  Pharmacy has been consulted for vancomycin and meropenem dosing. Pt with hx of MRSA UTI and ESBL UTIs.   Plan: Continue meropenem 1 g IV q8h. Patient remains afebrile and cultures are negative. Will f/u planned possible d/c abx or at least planned duration of therapy.   Height: 5\' 7"  (170.2 cm) Weight: 254 lb (115.2 kg) IBW/kg (Calculated) : 61.6  Temp (24hrs), Avg:98.1 F (36.7 C), Min:97.8 F (36.6 C), Max:98.4 F (36.9 C)   Recent Labs Lab 10/19/16 1810 10/20/16 0505 10/21/16 0513 10/23/16 0507  WBC  --   --  11.4* 7.1  CREATININE 1.31* 1.19* 1.07* 1.18*    Estimated Creatinine Clearance: 55.6 mL/min (by C-G formula based on SCr of 1.18 mg/dL (H)).    Allergies  Allergen Reactions  . Sulfa Antibiotics Hives  . Biaxin [Clarithromycin] Hives  . Influenza A (H1n1) Monoval Vac Other (See Comments)    Pt states that she was told by her MD not to get the influenza vaccine.    . Morphine Other (See Comments)    Reaction:  Dizziness and confusion   . Pyridium [Phenazopyridine Hcl] Other (See Comments)    Reaction:  Unknown   . Ceftriaxone Anxiety  . Latex Rash  . Prednisone Rash  . Tape Rash    Antimicrobials this admission: Levaquin  12/8 x 1 Vancomycin  12/8 >> 12/11, 1/4 >> Meropenem 12/9 >> 12/11, 1/4 >> Augmentin 12/11 >> 12/13  Dose adjustments this admission:   Microbiology results: 12/8 BCx: NG x 5 days  12/8 UCx: NG final  1/4 UA=+nitrite, TNTC WBC, small leuk, many bac:  1/4 UCx sent  Thank you for allowing pharmacy to be a part of this patient's care.  Ulice Dash D 10/25/2016 1:58 PM

## 2016-10-25 NOTE — Progress Notes (Signed)
Subjective: Patient much improved.  Awake and alert.  No seizure like activity noted.    Objective: Current vital signs: BP (!) 143/50   Pulse 65   Temp 98.1 F (36.7 C) (Oral)   Resp 20   Ht 5\' 7"  (1.702 m)   Wt 115.2 kg (254 lb)   SpO2 97%   BMI 39.78 kg/m  Vital signs in last 24 hours: Temp:  [97.8 F (36.6 C)-98.2 F (36.8 C)] 98.1 F (36.7 C) (01/08 0848) Pulse Rate:  [65-77] 65 (01/08 1022) Resp:  [17-20] 20 (01/08 0848) BP: (137-153)/(40-53) 143/50 (01/08 1022) SpO2:  [96 %-99 %] 97 % (01/08 0848)  Intake/Output from previous day: 01/07 0701 - 01/08 0700 In: 1300 [P.O.:1100; IV Piggyback:200] Out: 0  Intake/Output this shift: No intake/output data recorded. Nutritional status: Diet - low sodium heart healthy Diet Carb Modified Fluid consistency: Thin; Room service appropriate? Yes  Neurologic Exam: Mental Status: Alert, oriented, thought content appropriate.  Speech fluent without evidence of aphasia.  Able to follow commands. Cranial Nerves: II: Discs flat bilaterally; Visual fields grossly normal, pupils equal, round, reactive to light and accommodation III,IV, VI: ptosis not present, extra-ocular motions intact bilaterally V,VII: smile symmetric, facial light touch sensation normal bilaterally VIII: hearing normal bilaterally IX,X: gag reflex present XI: bilateral shoulder shrug XII: midline tongue extension Motor: Moves all extremities against gravity   Lab Results: Basic Metabolic Panel:  Recent Labs Lab 10/19/16 1810 10/20/16 0505 10/21/16 0513 10/23/16 0507  NA 129*  --  131* 136  K 5.1  --  4.2 3.3*  CL 93*  --  90* 96*  CO2 30  --  31 35*  GLUCOSE 289*  --  304* 93  BUN 38*  --  28* 39*  CREATININE 1.31* 1.19* 1.07* 1.18*  CALCIUM 10.2  --  10.3 9.3  MG  --   --   --  1.9  PHOS  --   --   --  3.8    Liver Function Tests:  Recent Labs Lab 10/23/16 0507  AST 19  ALT 14  ALKPHOS 43  BILITOT 0.9  PROT 6.0*  ALBUMIN 2.7*    No results for input(s): LIPASE, AMYLASE in the last 168 hours. No results for input(s): AMMONIA in the last 168 hours.  CBC:  Recent Labs Lab 10/21/16 0513 10/23/16 0507  WBC 11.4* 7.1  HGB 12.8 11.6*  HCT 37.4 34.1*  MCV 81.4 82.2  PLT 361 284    Cardiac Enzymes: No results for input(s): CKTOTAL, CKMB, CKMBINDEX, TROPONINI in the last 168 hours.  Lipid Panel: No results for input(s): CHOL, TRIG, HDL, CHOLHDL, VLDL, LDLCALC in the last 168 hours.  CBG:  Recent Labs Lab 10/24/16 2001 10/25/16 0003 10/25/16 0415 10/25/16 0747 10/25/16 1132  GLUCAP 238* 266* 178* 180* 334*    Microbiology: Results for orders placed or performed during the hospital encounter of 09/24/16  Urine culture     Status: None   Collection Time: 09/24/16 12:15 PM  Result Value Ref Range Status   Specimen Description URINE, RANDOM  Final   Special Requests NONE  Final   Culture NO GROWTH Performed at [facility]   Final   Report Status 09/26/2016 FINAL  Final  CULTURE, BLOOD (ROUTINE X 2) w Reflex to ID Panel     Status: None   Collection Time: 09/24/16  6:35 PM  Result Value Ref Range Status   Specimen Description BLOOD  RIGHT [facility]  Final  Special Requests   Final    BOTTLES DRAWN AEROBIC AND ANAEROBIC AER 8ML ANA 9ML   Culture NO GROWTH 5 DAYS  Final   Report Status 09/29/2016 FINAL  Final  CULTURE, BLOOD (ROUTINE X 2) w Reflex to ID Panel     Status: None   Collection Time: 09/24/16  7:46 PM  Result Value Ref Range Status   Specimen Description BLOOD  RIGHT HAND  Final   Special Requests   Final    BOTTLES DRAWN AEROBIC AND ANAEROBIC  AER 3CC ANA 3ML   Culture NO GROWTH 5 DAYS  Final   Report Status 10/08/2016 FINAL  Final  Urine culture     Status: Abnormal   Collection Time: 10/21/16  9:06 AM  Result Value Ref Range Status   Specimen Description URINE, RANDOM  Final   Special Requests NONE  Final   Culture MULTIPLE SPECIES PRESENT, SUGGEST RECOLLECTION (A)  Final    Report Status 10/22/2016 FINAL  Final    Coagulation Studies: No results for input(s): LABPROT, INR in the last 72 hours.  Imaging: No results found.  Medications:  I have reviewed the patient's current medications. Scheduled: . amLODipine  10 mg Oral Daily  . atorvastatin  20 mg Oral QHS  . clonazePAM  0.25 mg Oral BID  . conjugated estrogens  1 Applicatorful Vaginal Once per day on Mon Wed Fri  . darifenacin  15 mg Oral Daily  . desmopressin  0.2 mg Oral QHS  . docusate sodium  100 mg Oral BID  . enoxaparin (LOVENOX) injection  40 mg Subcutaneous Q24H  . feeding supplement (ENSURE ENLIVE)  237 mL Oral TID BM  . furosemide  20 mg Oral Daily  . insulin aspart  0-15 Units Subcutaneous Q4H  . insulin glargine  67 Units Subcutaneous QHS  . iron polysaccharides  150 mg Oral TID  . levETIRAcetam  250 mg Oral BID  . loratadine  10 mg Oral Daily  . meropenem  1 g Intravenous Q8H  . metoCLOPramide  5 mg Oral TID AC  . metoprolol succinate  50 mg Oral BID  . nystatin   Topical TID  . OLANZapine zydis  5 mg Oral QHS  . pantoprazole  40 mg Oral BID  . polyethylene glycol  17 g Oral Daily  . potassium chloride SA  20 mEq Oral Daily  . senna  1 tablet Oral Daily  . sodium chloride flush  10-40 mL Intracatheter Q12H  . traZODone  50 mg Oral QHS  . vitamin E  1,000 Units Oral Daily    Assessment/Plan: Patient much improved.  No longer febrile.  WBC count improved.    Recommendations: 1.  Decrease Keppra to 250mg  BID.  Will change to po as well.     LOS: 31 days   Alexis Goodell, MD Neurology [phone] 10/25/2016  12:10 PM

## 2016-10-25 NOTE — Progress Notes (Signed)
Nutrition Follow-up  DOCUMENTATION CODES:   Morbid obesity  INTERVENTION:  Discontinued Glucerna.  Continue Ensure Enlive po TID, each supplement provides 350 kcal and 20 grams of protein.  Per DM Coordinator recommendations, can cover Ensure Enlive with 3-4 units of Novolog.   Continue Magic Cup with meals. Continue snacks as ordered in [employer].  NUTRITION DIAGNOSIS:   Inadequate oral intake related to acute illness (AMS) as evidenced by meal completion < 25%.  Improving.  GOAL:   Patient will meet greater than or equal to 90% of their needs  Progressing.  MONITOR:   PO intake, Supplement acceptance, Labs, Weight trends  REASON FOR ASSESSMENT:   LOS    ASSESSMENT:   76 yo female admitted with AMS/delerium with unclear etiology. Pt with hx of DM, CHF, anxiety.   -Per chart no further ECT planned by Psych.  -Palliative Medicine still following patient. No PEG. Palliative services will continue to follow on discharge.  Spoke with patient at bedside. She was awake/oriented and able to participate in conversation. She reports her appetite is improving, but she does not like the food here. She reports eating 100% of cottage cheese plate earlier today and had finished 25% of lunch at time of assessment and was still eating. Patient reports she enjoys the Ensure and is amenable to continue drinking them between meals. She had 3 Ensure Enlive yesterday. Patient denies abdominal pain or N/V.  Meal Completion: 25-100% past 24 hours per patient report. In the past 24 hours patient has had approximately 1369 kcal (86% minimum estimated kcal needs) and 84 grams of protein (65% minimum estimated protein needs).   Medications reviewed and include: Colace, Lasix 20 mg daily, Novolog sliding scale Q4hrs, Lantus 72 units daily at bedtime (increased to this today), pantoprazole, Miralax, potassium chloride 20 mEq daily, senna, vitamin E 1000 units daily.  Labs reviewed: CBG 178-334  past 24 hrs. On 1/6 patient with potassium 3.3, but Phosphorus and Magnesium were WNL. No subsequent Chem Profile.  Discussed with RN. PO intake improving. Noted after assessment that patient has been changed from Ensure Enlive TID to Glucerna in setting of recommendation from DM Coordinator. RD discussed with DM Coordinator about importance of patient receiving adequate calories/protein. DM Coordinator reports can just cover Ensure Enlive with 3-4 units Novolog. Discussed plan with MD Vianne Bulls - okay to change back to Ensure and cover with Novolog.  Diet Order:  Diet - low sodium heart healthy Diet Carb Modified Fluid consistency: Thin; Room service appropriate? Yes  Skin:   (no pressure ulcer (noted stage II pressure ulcer documented 1 month ago))  Last BM:  10/23/2016  Height:   Ht Readings from Last 1 Encounters:  10/20/16 5\' 7"  (1.702 m)    Weight:   Wt Readings from Last 1 Encounters:  10/20/16 254 lb (115.2 kg)    Ideal Body Weight:     BMI:  Body mass index is 39.78 kg/m.  Estimated Nutritional Needs:   Kcal:  1600-2000 calories  Protein:  100-140 g  Fluid:  >/= 1.6 L  EDUCATION NEEDS:   No education needs identified at this time  Willey Blade, MS, RD, LDN Pager: [phone] After Hours Pager: [phone]

## 2016-10-25 NOTE — Progress Notes (Signed)
Daily Progress Note   Patient Name: Carolyn Valdez       Date: 10/25/2016 DOB: 1940-10-31  Age: 76 y.o. MRN#: [id number] Attending Physician: Epifanio Lesches, MD Primary Care Physician: Leonel Ramsay, MD Admit Date: 09/24/2016  Reason for Consultation/Follow-up: Establishing goals of care  Subjective: Met patient at bedside, much improved from my encounter with her last week. She is alert and oriented to person and place. She is sitting up in bed eating lunch. Denies pain or discomfort. She doesn't remember what happened to her last week after ECT. She is most concerned about getting home to her dog, Abby. Explained that she will most likely need to go to a rehab facility first.   Updated sister, Carolyn Valdez via telephone. She visited the hospital last night and tells me her sister "was acting feisty and closer to baseline." Discussed and encouraged palliative to follow once she is discharged. Carolyn Valdez agrees with this. Encouraged her to continue conversations with her sister on if this would happen again and what her wishes would be at EOL regarding recurrent hospitalizations or artificial hydration/nutrition. Answered questions and offered support.   Length of Stay: 31  Current Medications: Scheduled Meds:  . amLODipine  10 mg Oral Daily  . atorvastatin  20 mg Oral QHS  . clonazePAM  0.25 mg Oral BID  . conjugated estrogens  1 Applicatorful Vaginal Once per day on Mon Wed Fri  . darifenacin  15 mg Oral Daily  . desmopressin  0.2 mg Oral QHS  . docusate sodium  100 mg Oral BID  . enoxaparin (LOVENOX) injection  40 mg Subcutaneous Q24H  . feeding supplement (ENSURE ENLIVE)  237 mL Oral TID BM  . furosemide  20 mg Oral Daily  . insulin aspart  0-15 Units Subcutaneous Q4H  . insulin  glargine  67 Units Subcutaneous QHS  . iron polysaccharides  150 mg Oral TID  . levETIRAcetam  250 mg Oral BID  . loratadine  10 mg Oral Daily  . meropenem  1 g Intravenous Q8H  . metoCLOPramide  5 mg Oral TID AC  . metoprolol succinate  50 mg Oral BID  . nystatin   Topical TID  . OLANZapine zydis  5 mg Oral QHS  . pantoprazole  40 mg Oral BID  . polyethylene glycol  17 g  Oral Daily  . potassium chloride SA  20 mEq Oral Daily  . senna  1 tablet Oral Daily  . sodium chloride flush  10-40 mL Intracatheter Q12H  . traZODone  50 mg Oral QHS  . vitamin E  1,000 Units Oral Daily    Continuous Infusions:  PRN Meds: acetaminophen **OR** acetaminophen, alum & mag hydroxide-simeth, hydrALAZINE, ondansetron **OR** ondansetron (ZOFRAN) IV, polyvinyl alcohol, sodium chloride flush  Physical Exam  Constitutional: She is cooperative.  Opens eyes to voice and tracks  HENT:  Head: Normocephalic and atraumatic.  Cardiovascular: Regular rhythm and normal heart sounds.   Pulmonary/Chest: Effort normal. She has decreased breath sounds.  Abdominal: Soft. Bowel sounds are normal. There is no tenderness.  Neurological: She is alert.  Oriented to person and place  Skin: Skin is warm and dry. There is pallor.  Psychiatric: She has a normal mood and affect. Her speech is normal and behavior is normal.  Nursing note and vitals reviewed.          Vital Signs: BP (!) 138/46 (BP Location: Left Arm)   Pulse 72   Temp 98.4 F (36.9 C) (Oral)   Resp 18   Ht 5' 7"  (1.702 m)   Wt 115.2 kg (254 lb)   SpO2 97%   BMI 39.78 kg/m  SpO2: SpO2: 97 % O2 Device: O2 Device: Not Delivered O2 Flow Rate: O2 Flow Rate (L/min): 0 L/min  Intake/output summary:   Intake/Output Summary (Last 24 hours) at 10/25/16 1327 Last data filed at 10/25/16 1220  Gross per 24 hour  Intake              980 ml  Output                0 ml  Net              980 ml   LBM: Last BM Date: 10/23/16 Baseline Weight: Weight: 131.5  kg (290 lb) Most recent weight: Weight: 115.2 kg (254 lb)       Palliative Assessment/Data: PPS 30%   Flowsheet Rows   Flowsheet Row Most Recent Value  Intake Tab  Referral Department  Hospitalist  Unit at Time of Referral  Med/Surg Unit  Palliative Care Primary Diagnosis  Other (Comment)  Date Notified  10/04/16  Palliative Care Type  New Palliative care  Reason for referral  Clarify Goals of Care  Date of Admission  09/24/16  Date first seen by Palliative Care  10/05/16  # of days Palliative referral response time  1 Day(s)  # of days IP prior to Palliative referral  10  Clinical Assessment  Palliative Performance Scale Score  30%  Psychosocial & Spiritual Assessment  Palliative Care Outcomes  Patient/Family meeting held?  Yes  Who was at the meeting?  patient and sister via telephone  Palliative Care Outcomes  Clarified goals of care, Provided psychosocial or spiritual support, ACP counseling assistance      Patient Active Problem List   Diagnosis Date Noted  . Palliative care encounter   . Goals of care, counseling/discussion   . DNR (do not resuscitate) 10/05/2016  . Palliative care by specialist 10/05/2016  . Depression, major, recurrent, severe with psychosis ([place]) 10/05/2016  . Severe major depression, single episode, with psychotic features ([place]) 10/04/2016  . Cellulitis of leg, right 09/29/2016  . Proctitis 09/29/2016  . Hypercapnia 09/29/2016  . Acute urinary retention 09/29/2016  . UTI (urinary tract infection) 09/29/2016  . Weakness 09/29/2016  .  Subacute delirium 09/28/2016  . Gastrointestinal hemorrhage   . Diffuse abdominal pain   . Altered mental status 09/24/2016  . Pressure injury of skin 09/02/2016  . Respiratory failure with hypoxia ([place]) 09/01/2016  . Lymphedema 08/16/2016  . CHF (congestive heart failure) ([place]) 08/09/2016  . Congestive heart failure ([place]) 11/25/2015  . Nocturia 11/05/2015  . Urinary frequency 11/05/2015  . Acute  respiratory failure with hypoxia ([place]) 09/05/2015  . 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 Incontinence 04/20/2015  . Diabetes mellitus, type 2 ([place]) 04/17/2015  . ESBL (extended spectrum beta-lactamase) producing bacteria infection 04/17/2015  . BP (high blood pressure) 04/17/2015  . Frequent UTI 04/17/2015  . Absence of bladder continence 04/17/2015  . Iron deficiency anemia 03/08/2015  . Absolute anemia 09/25/2014  . Abdominal pain, lower 09/25/2014  . Urge incontinence 02/19/2013  . FOM (frequency of micturition) 02/19/2013  . Bladder infection, chronic 08/11/2012  . Difficult or painful urination 07/26/2012  . Lower urinary tract infection 12/31/2011  . Diabetes mellitus ([place]) 12/31/2011    Palliative Care Assessment & Plan   Patient Profile: 76 y.o. female  with a known history of multiple medical problems including diastolic congestive heart failure, arthritis, bilateral lower extremity edema from venous insufficiency, multiple admissions for recurrent UTIs, hypertension, diabetes mellitus brought in from home after a recent discharge secondary to altered mental status. Per sister, the patient has had psych history of depression, anxiety, paranoia, and suicidal ideations. She has been seen by psych and received first ECT therapy on 10/20/16. Rapid response called this day due to unresponsiveness, fever, and possible seizure. Chest x-ray suggestive of possible infiltrate/atelectasis. UA was abnormal but cultures show no growth-receiving meropenem. Patient is not working with physical therapy and not eating/too lethargic to eat.   Assessment: Acute encephalopathy Severe depression Weakness Failure to thrive Hx of diastolic congestive heart failure   Recommendations/Plan:  DNR/DNI  Palliative services to follow on discharge.   PMT will continue to shadow chart and further discuss GOC with patient/family if she declines.    Code Status: DNR   Code Status Orders        Start      Ordered   10/05/16 1217  Do not attempt resuscitation (DNR)  Continuous    Question Answer Comment  In the event of cardiac or respiratory ARREST Do not call a "code blue"   In the event of cardiac or respiratory ARREST Do not perform Intubation, CPR, defibrillation or ACLS   In the event of cardiac or respiratory ARREST Use medication by any route, position, wound care, and other measures to relive pain and suffering. May use oxygen, suction and manual treatment of airway obstruction as needed for comfort.      10/05/16 1216    Code Status History    Date Active Date Inactive Code Status Order ID Comments User Context   09/24/2016  5:31 PM 10/05/2016 12:16 PM Full Code [id number]  Gladstone Lighter, MD Inpatient   09/01/2016  2:17 AM 09/05/2016  4:28 PM Full Code [id number]  Harvie Bridge, DO Inpatient   08/09/2016  8:27 PM 08/12/2016  8:15 PM Full Code [id number]  Henreitta Leber, MD Inpatient   09/30/2015  2:23 PM 10/07/2015  5:37 PM Full Code [id number]  Loletha Grayer, MD ED   09/05/2015  5:56 PM 09/09/2015  5:38 PM DNR [id number]  Loletha Grayer, MD ED    Advance Directive Documentation   Flowsheet Row Most Recent Value  Type of Advance Directive  Living will  Pre-existing out of facility DNR order (yellow form or pink MOST form)  No data  "MOST" Form in Place?  No data       Prognosis:   Unable to determine  Discharge Planning:  [facility] for rehab with Palliative care service follow-up  Care plan was discussed with sister, SW/CM, and RN  Thank you for allowing the Palliative Medicine Team to assist in the care of this patient.   Time In: 1300 Time Out: 1335 Total Time 61mn Prolonged Time Billed no      Greater than 50%  of this time was spent counseling and coordinating care related to the above assessment and plan.  MIhor Dow FNP-C Palliative Medicine Team  Phone: [phone]: [phone] Please contact Palliative Medicine Team  phone at [phone] questions and concerns.

## 2016-10-25 NOTE — NC FL2 (Signed)
[place] LEVEL OF CARE SCREENING TOOL     IDENTIFICATION  Patient Name: Carolyn Valdez: May 19, 1941 Sex: female Admission Date (Current Location): 09/24/2016  [facility] and [place] Number:  Engineering geologist and Address:  [facility], [address], [place], [place] [address]      Provider Number: [id number]  Attending Physician Name and Address:  Epifanio Lesches, MD  Relative Name and Phone Number:       Current Level of Care: Hospital Recommended Level of Care: [place] Prior Approval Number:    Date Approved/Denied:   PASRR Number:    Discharge Plan: SNF    Current Diagnoses: Patient Active Problem List   Diagnosis Date Noted  . Palliative care encounter   . Goals of care, counseling/discussion   . DNR (do not resuscitate) 10/05/2016  . Palliative care by specialist 10/05/2016  . Depression, major, recurrent, severe with psychosis ([place]) 10/05/2016  . Severe major depression, single episode, with psychotic features ([place]) 10/04/2016  . Cellulitis of leg, right 09/29/2016  . Proctitis 09/29/2016  . Hypercapnia 09/29/2016  . Acute urinary retention 09/29/2016  . UTI (urinary tract infection) 09/29/2016  . Weakness 09/29/2016  . Subacute delirium 09/28/2016  . Gastrointestinal hemorrhage   . Diffuse abdominal pain   . Altered mental status 09/24/2016  . Pressure injury of skin 09/02/2016  . Respiratory failure with hypoxia ([place]) 09/01/2016  . Lymphedema 08/16/2016  . CHF (congestive heart failure) ([place]) 08/09/2016  . Congestive heart failure ([place]) 11/25/2015  . Nocturia 11/05/2015  . Urinary frequency 11/05/2015  . Acute respiratory failure with hypoxia ([place]) 09/05/2015  . 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 Incontinence 04/20/2015  . Diabetes mellitus, type 2 ([place]) 04/17/2015  . ESBL (extended spectrum beta-lactamase) producing bacteria infection 04/17/2015  . BP (high blood pressure) 04/17/2015  .  Frequent UTI 04/17/2015  . Absence of bladder continence 04/17/2015  . Iron deficiency anemia 03/08/2015  . Absolute anemia 09/25/2014  . Abdominal pain, lower 09/25/2014  . Urge incontinence 02/19/2013  . FOM (frequency of micturition) 02/19/2013  . Bladder infection, chronic 08/11/2012  . Difficult or painful urination 07/26/2012  . Lower urinary tract infection 12/31/2011  . Diabetes mellitus ([place]) 12/31/2011    Orientation RESPIRATION BLADDER Height & Weight     Self, Place  Normal, O2 (1 liter) Incontinent Weight: 254 lb (115.2 kg) Height:  5\' 7"  (170.2 cm)  BEHAVIORAL SYMPTOMS/MOOD NEUROLOGICAL BOWEL NUTRITION STATUS   (none) Convulsions/Seizures Incontinent Diet  AMBULATORY STATUS COMMUNICATION OF NEEDS Skin   Total Care Verbally Normal                       Personal Care Assistance Level of Assistance  Total care       Total Care Assistance: Maximum assistance   Functional Limitations Info   (none) Sight Info: Impaired        SPECIAL CARE FACTORS FREQUENCY  PT (By licensed PT)                    Contractures Contractures Info: Not present    Additional Factors Info  Code Status, Isolation Precautions Code Status Info: DNR Allergies Info: sulf abx, biaxin,monovac, pyridium morphine; latex; prednisone           Current Medications (10/25/2016):  This is the current hospital active medication list Current Facility-Administered Medications  Medication Dose Route Frequency Provider Last Rate Last Dose  . acetaminophen (TYLENOL) tablet 650  mg  650 mg Oral Q6H PRN Gladstone Lighter, MD   650 mg at 10/24/16 2221   Or  . acetaminophen (TYLENOL) suppository 650 mg  650 mg Rectal Q6H PRN Gladstone Lighter, MD   650 mg at 10/21/16 0550  . alum & mag hydroxide-simeth (MAALOX/MYLANTA) 200-200-20 MG/5ML suspension 30 mL  30 mL Oral Q4H PRN Theodoro Grist, MD   30 mL at 09/25/16 2359  . amLODipine (NORVASC) tablet 10 mg  10 mg Oral Daily Theodoro Grist, MD    10 mg at 10/25/16 1024  . atorvastatin (LIPITOR) tablet 20 mg  20 mg Oral QHS Dustin Flock, MD   20 mg at 10/24/16 2224  . clonazePAM (KLONOPIN) tablet 0.25 mg  0.25 mg Oral BID Gonzella Lex, MD   0.25 mg at 10/25/16 1024  . conjugated estrogens (PREMARIN) vaginal cream 1 Applicatorful  1 Applicatorful Vaginal Once per day on Mon Wed Fri Gladstone Lighter, MD   1 Applicatorful at 22/97/98 0854  . darifenacin (ENABLEX) 24 hr tablet 15 mg  15 mg Oral Daily Theodoro Grist, MD   15 mg at 10/25/16 1024  . desmopressin (DDAVP) tablet 0.2 mg  0.2 mg Oral QHS Gladstone Lighter, MD   0.2 mg at 10/24/16 2228  . docusate sodium (COLACE) capsule 100 mg  100 mg Oral BID Napoleon Form, RPH   100 mg at 10/25/16 1025  . enoxaparin (LOVENOX) injection 40 mg  40 mg Subcutaneous Q24H Rocky Morel, RPH   40 mg at 10/24/16 1713  . feeding supplement (ENSURE ENLIVE) (ENSURE ENLIVE) liquid 237 mL  237 mL Oral TID BM Henreitta Leber, MD   237 mL at 10/25/16 1000  . furosemide (LASIX) tablet 20 mg  20 mg Oral Daily Dustin Flock, MD   20 mg at 10/25/16 1025  . hydrALAZINE (APRESOLINE) injection 10 mg  10 mg Intravenous Q6H PRN Dustin Flock, MD      . insulin aspart (novoLOG) injection 0-15 Units  0-15 Units Subcutaneous Q4H Dustin Flock, MD   3 Units at 10/25/16 0854  . insulin glargine (LANTUS) injection 67 Units  67 Units Subcutaneous QHS Dustin Flock, MD   67 Units at 10/24/16 2056  . iron polysaccharides (NIFEREX) capsule 150 mg  150 mg Oral TID Gladstone Lighter, MD   150 mg at 10/25/16 1024  . levETIRAcetam (KEPPRA) tablet 250 mg  250 mg Oral BID Alexis Goodell, MD      . loratadine (CLARITIN) tablet 10 mg  10 mg Oral Daily Gladstone Lighter, MD   10 mg at 10/25/16 1024  . meropenem (MERREM) IVPB SOLR 1 g  1 g Intravenous Q8H Dustin Flock, MD   1 g at 10/25/16 0854  . metoCLOPramide (REGLAN) tablet 5 mg  5 mg Oral TID AC Theodoro Grist, MD   5 mg at 10/25/16 0853  . metoprolol succinate (TOPROL-XL) 24  hr tablet 50 mg  50 mg Oral BID Theodoro Grist, MD   50 mg at 10/25/16 1025  . nystatin (MYCOSTATIN/NYSTOP) topical powder   Topical TID Gladstone Lighter, MD      . OLANZapine zydis (ZYPREXA) disintegrating tablet 5 mg  5 mg Oral QHS Gonzella Lex, MD   5 mg at 10/24/16 2224  . ondansetron (ZOFRAN) tablet 4 mg  4 mg Oral Q6H PRN Gladstone Lighter, MD   4 mg at 09/29/16 1109   Or  . ondansetron (ZOFRAN) injection 4 mg  4 mg Intravenous Q6H PRN Gladstone Lighter, MD  4 mg at 09/27/16 0950  . pantoprazole (PROTONIX) EC tablet 40 mg  40 mg Oral BID Gladstone Lighter, MD   40 mg at 10/25/16 1025  . polyethylene glycol (MIRALAX / GLYCOLAX) packet 17 g  17 g Oral Daily Dustin Flock, MD   17 g at 10/25/16 1026  . polyvinyl alcohol (LIQUIFILM TEARS) 1.4 % ophthalmic solution 1 drop  1 drop Both Eyes PRN Theodoro Grist, MD      . potassium chloride SA (K-DUR,KLOR-CON) CR tablet 20 mEq  20 mEq Oral Daily Gladstone Lighter, MD   20 mEq at 10/25/16 1023  . senna (SENOKOT) tablet 8.6 mg  1 tablet Oral Daily Napoleon Form, RPH   8.6 mg at 10/25/16 1025  . sodium chloride flush (NS) 0.9 % injection 10-40 mL  10-40 mL Intracatheter Q12H Epifanio Lesches, MD   20 mL at 10/25/16 1026  . sodium chloride flush (NS) 0.9 % injection 10-40 mL  10-40 mL Intracatheter PRN Epifanio Lesches, MD      . traZODone (DESYREL) tablet 50 mg  50 mg Oral QHS Loletha Grayer, MD   50 mg at 10/24/16 2228  . vitamin E capsule 1,000 Units  1,000 Units Oral Daily Gladstone Lighter, MD   1,000 Units at 10/25/16 1023   Facility-Administered Medications Ordered in Other Encounters  Medication Dose Route Frequency Provider Last Rate Last Dose  . 0.9 %  sodium chloride infusion    Continuous PRN Dionne Bucy, CRNA         Discharge Medications: Please see discharge summary for a list of discharge medications.  Relevant Imaging Results:  Relevant Lab Results:   Additional Information ss: [id number]  Shela Leff,  LCSW

## 2016-10-25 NOTE — Progress Notes (Signed)
Patient ID: Carolyn Valdez, female   DOB: May 14, 1941, 76 y.o.   MRN: [id number]   [facility] PROGRESS NOTE  Carolyn Valdez AVW:[id number] DOB: December 16, 1940 DOA: 09/24/2016 PCP: Leonel Ramsay, MD  HPI/Subjective :   awake, oriented. Denies any shortness of breath, chest pain. No abdominal pain. No nausea or vomiting.  Marland Kitchen  objective  Vitals:   10/25/16 1022 10/25/16 1226  BP: (!) 143/50 (!) 138/46  Pulse: 65 72  Resp:  18  Temp:  98.4 F (36.9 C)    Filed Weights   09/24/16 1159 10/14/16 1432 10/20/16 0954  Weight: 131.5 kg (290 lb) 114.7 kg (252 lb 12.8 oz) 115.2 kg (254 lb)    ROS: Review of Systems  Constitutional: Negative for chills and fever.  HENT: Negative for hearing loss and tinnitus.   Eyes: Negative for blurred vision, double vision and photophobia.  Respiratory: Negative for cough, hemoptysis and shortness of breath.   Cardiovascular: Negative for chest pain, palpitations, orthopnea and leg swelling.  Gastrointestinal: Negative for abdominal pain, diarrhea, heartburn, nausea and vomiting.  Genitourinary: Negative for dysuria and urgency.  Musculoskeletal: Negative for myalgias and neck pain.  Skin: Negative for itching and rash.  Neurological: Negative for dizziness, tingling, focal weakness, seizures, weakness and headaches.  Endo/Heme/Allergies: Negative for environmental allergies. Does not bruise/bleed easily.  Psychiatric/Behavioral: Negative for depression, memory loss, substance abuse and suicidal ideas. The patient does not have insomnia.    Exam: Physical Exam  HENT:  Nose: No mucosal edema.  Mouth/Throat: No oropharyngeal exudate or posterior oropharyngeal edema.  Eyes: Conjunctivae, EOM and lids are normal. Pupils are equal, round, and reactive to light.  Neck: No JVD present. Carotid bruit is not present. No edema present. No thyroid mass and no thyromegaly present.  Cardiovascular: S1 normal and S2 normal.  Exam reveals no gallop.   Murmur  heard.  Systolic murmur is present with a grade of 2/6  Pulses:      Dorsalis pedis pulses are 2+ on the right side, and 2+ on the left side.  Respiratory: No respiratory distress. She has no decreased breath sounds. She has no wheezes. She has no rhonchi. She has no rales.  GI: Soft. Bowel sounds are normal. There is no tenderness.  Musculoskeletal:       Right ankle: She exhibits swelling.       Left ankle: She exhibits swelling.  Lymphadenopathy:    She has no cervical adenopathy.  Neurological: She is alert. No cranial nerve deficit.  Skin: Skin is warm. No rash noted. Nails show no clubbing.      Data Reviewed: Basic Metabolic Panel:  Recent Labs Lab 10/19/16 1810 10/20/16 0505 10/21/16 0513 10/23/16 0507  NA 129*  --  131* 136  K 5.1  --  4.2 3.3*  CL 93*  --  90* 96*  CO2 30  --  31 35*  GLUCOSE 289*  --  304* 93  BUN 38*  --  28* 39*  CREATININE 1.31* 1.19* 1.07* 1.18*  CALCIUM 10.2  --  10.3 9.3  MG  --   --   --  1.9  PHOS  --   --   --  3.8   BNP (last 3 results)  Recent Labs  02/23/16 1617 08/09/16 1550 08/31/16 2145  BNP 181.0* 311.0* 163.0*     CBG:  Recent Labs Lab 10/24/16 2001 10/25/16 0003 10/25/16 0415 10/25/16 0747 10/25/16 1132  GLUCAP 238* 266* 178* 180* 334*  Scheduled Meds: . amLODipine  10 mg Oral Daily  . atorvastatin  20 mg Oral QHS  . clonazePAM  0.25 mg Oral BID  . conjugated estrogens  1 Applicatorful Vaginal Once per day on Mon Wed Fri  . darifenacin  15 mg Oral Daily  . desmopressin  0.2 mg Oral QHS  . docusate sodium  100 mg Oral BID  . enoxaparin (LOVENOX) injection  40 mg Subcutaneous Q24H  . feeding supplement (ENSURE ENLIVE)  237 mL Oral TID BM  . furosemide  20 mg Oral Daily  . insulin aspart  0-15 Units Subcutaneous Q4H  . insulin glargine  67 Units Subcutaneous QHS  . iron polysaccharides  150 mg Oral TID  . levETIRAcetam  250 mg Oral BID  . loratadine  10 mg Oral Daily  . meropenem  1 g  Intravenous Q8H  . metoCLOPramide  5 mg Oral TID AC  . metoprolol succinate  50 mg Oral BID  . nystatin   Topical TID  . OLANZapine zydis  5 mg Oral QHS  . pantoprazole  40 mg Oral BID  . polyethylene glycol  17 g Oral Daily  . potassium chloride SA  20 mEq Oral Daily  . senna  1 tablet Oral Daily  . sodium chloride flush  10-40 mL Intracatheter Q12H  . traZODone  50 mg Oral QHS  . vitamin E  1,000 Units Oral Daily    Assessment/Plan:  1. Acute encephalopathy patient also has concurrent fever ,MRI is negative. Appreciate neurology input Acute encephalopathy likely secondary to sepsis and urine infection:had rigors,neeurology recommended continuing the Keppra.,   Encephalopathy;, patient became alert, awake, for the most part she is participating in the conversations. Physical therapy recommended skilled nursing facility placement.  2.  severe depression.  Per pychiatry . Started on Zyprexa.. 3.  Type 2 diabetes-controlled, continue Lantus,ssi with coverage., Just the Lantus to 72 units daily at bedtime as per diabetic recommendation. Change ensure to glucerna,  4. Hyperlipidemia unspecified. continue atorvastatin . Continue by mouth daily.   5. Fever suspect related to ECT therapy no further fevers .stopped  vancomycin chest x-ray suggestive of possible infiltrate atelectasis UA was abnormal however urine culture shows no growth ;will continue meropenem for now. 6.  7. Essential hypertension ;on Norvasc  Toprol. if elevated we will do when necessary IV hydralazine. 8.  9. GERD on Protonix. 10.  11. History of diabetic gastroparesis; on Reglan. 12.  13. History of diastolic congestive heart failure and lower extremity edema  14.  15. Weakness.participated  physical therapy, physical therapy recommended skilled nursing facility placement.The patient  told me that  She  wants to go home. 16.  Failure to thrive. Intake improving.  If no further  ECT  is planned by  Psych, will  discharge. Patient in next 2-3 days .   prognosis  Poor   secondary to her advanced age, multiple medical problems.   Code Status:     Code Status Orders        Start     Ordered   10/05/16 1217  Do not attempt resuscitation (DNR)  Continuous    Question Answer Comment  In the event of cardiac or respiratory ARREST Do not call a "code blue"   In the event of cardiac or respiratory ARREST Do not perform Intubation, CPR, defibrillation or ACLS   In the event of cardiac or respiratory ARREST Use medication by any route, position, wound care, and other measures to relive pain  and suffering. May use oxygen, suction and manual treatment of airway obstruction as needed for comfort.      10/05/16 1216    Code Status History    Date Active Date Inactive Code Status Order ID Comments User Context   09/24/2016  5:31 PM 10/05/2016 12:16 PM Full Code [id number]  Gladstone Lighter, MD Inpatient   09/01/2016  2:17 AM 09/05/2016  4:28 PM Full Code [id number]  Harvie Bridge, DO Inpatient   08/09/2016  8:27 PM 08/12/2016  8:15 PM Full Code [id number]  Henreitta Leber, MD Inpatient   09/30/2015  2:23 PM 10/07/2015  5:37 PM Full Code [id number]  Loletha Grayer, MD ED   09/05/2015  5:56 PM 09/09/2015  5:38 PM DNR [id number]  Loletha Grayer, MD ED    Advance Directive Documentation   [place] Most Recent Value  Type of Advance Directive  Living will  Pre-existing out of facility DNR order (yellow form or pink MOST form)  No data  "MOST" Form in Place?  No data     Disposition Plan:As per psychiatry no today states that patient will need inpatient psychiatric Consultants:  Psychiatry  Time spent: 27min spent [employer] [facility]

## 2016-10-25 NOTE — Care Management Important Message (Signed)
Important Message  Patient Details  Name: Carolyn Valdez MRN: [id number] Date of Birth: 1941-10-09   Medicare Important Message Given:  Yes    Beverly Sessions, RN 10/25/2016, 12:13 PM

## 2016-10-25 NOTE — Consult Note (Addendum)
[facility] Consult   Reason for Consult:  This is a follow-up consult for this 76 year old woman with depression Referring Physician:  Leslye Peer Patient Identification: Carolyn Valdez MRN:  [id number] Principal Diagnosis: Severe major depression, single episode, with psychotic features [facility]) Diagnosis:   Patient Active Problem List   Diagnosis Date Noted  . Palliative care encounter [Z51.5]   . Goals of care, counseling/discussion [Z71.89]   . DNR (do not resuscitate) [Z66] 10/05/2016  . Palliative care by specialist [Z51.5] 10/05/2016  . Depression, major, recurrent, severe with psychosis ([place]) [F33.3] 10/05/2016  . Severe major depression, single episode, with psychotic features ([place]) [F32.3] 10/04/2016  . Cellulitis of leg, right [L03.115] 09/29/2016  . Proctitis [K62.89] 09/29/2016  . Hypercapnia [R06.89] 09/29/2016  . Acute urinary retention [R33.8] 09/29/2016  . UTI (urinary tract infection) [N39.0] 09/29/2016  . Weakness [R53.1] 09/29/2016  . Subacute delirium [F05] 09/28/2016  . Gastrointestinal hemorrhage [K92.2]   . Diffuse abdominal pain [R10.84]   . Altered mental status [R41.82] 09/24/2016  . Pressure injury of skin [L89.90] 09/02/2016  . Respiratory failure with hypoxia ([place]) [J96.91] 09/01/2016  . Lymphedema [I89.0] 08/16/2016  . CHF (congestive heart failure) ([place]) [I50.9] 08/09/2016  . Congestive heart failure ([place]) [I50.9] 11/25/2015  . Nocturia [R35.1] 11/05/2015  . Urinary frequency [R35.0] 11/05/2015  . Acute respiratory failure with hypoxia ([place]) [J96.01] 09/05/2015  . Recurrent UTI [N39.0] 04/20/2015  . Incontinence [R32] 04/20/2015  . Diabetes mellitus, type 2 ([place]) [E11.9] 04/17/2015  . ESBL (extended spectrum beta-lactamase) producing bacteria infection [A49.9, Z16.12] 04/17/2015  . BP (high blood pressure) [I10] 04/17/2015  . Frequent UTI [N39.0] 04/17/2015  . Absence of bladder continence [R32] 04/17/2015  . Iron deficiency anemia  [D50.9] 03/08/2015  . Absolute anemia [D64.9] 09/25/2014  . Abdominal pain, lower [R10.30] 09/25/2014  . Urge incontinence [N39.41] 02/19/2013  . FOM (frequency of micturition) [R35.0] 02/19/2013  . Bladder infection, chronic [N30.20] 08/11/2012  . Difficult or painful urination [R30.0] 07/26/2012  . Lower urinary tract infection [N39.0] 12/31/2011  . Diabetes mellitus ([place]) [E11.9] 12/31/2011    Total Time spent with patient: 20 minutes  Subjective:   Carolyn Valdez is a 76 y.o. female patient admitted with patient made no comment.  HPI: Follow-up for January 3. Patient had her first ECT treatment this morning. Treatment went well without any complications. Prior to treatment this morning the patient was actually being quite verbal especially with the nursing staff and her sister. I have learned that she tends to be uniquely uncommunicative when I am involved for some reason.  Follow-up for Thursday the fourth. Patient unfortunately today has taken a turn for the worse medically. She has been much less responsive today. When I came to see her this afternoon she briefly seemed to be able to speak but then became less responsive. Her sister think she may have spoken a little bit to her. Patient's fever is up at 102 and her urine is grossly infected. Patient was shaking when I came by but it is not seizure-like at all. I doubt very much that she had a seizure. There is no reason to think that what happened. Her head CT is normal. I would probably not suggest continuing antiseizure medicine.  Follow-up for Friday the fifth. Patient seen. Spoke with her sister again. Chart reviewed. Patient is unresponsive essentially this afternoon. She opened her eyes and made brief eye contact but would not follow commands to move her eyes and did not speak. She is still  having a slightly elevated temperature and her blood pressure is a little bit up. Urine culture came back mixed infection. MRI came back showing  essentially the same thing the CT showed which is no focal change but chronic microvascular disease.  Follow-up for Monday the eighth. Patient seems to of improved dramatically over the weekend. She seems to be back to the mental state she was in about a week ago. Affect was constricted and a little blunted but she was still able to smile. She was oriented to where she was and the basic time. She was able to carry on a fairly normal conversation if a little bit slow. She says she is still not eating very much.  Past Psychiatric History: Past history of depression possible bipolar disorder.  Risk to Self: Is patient at risk for suicide?: No Risk to Others:   Prior Inpatient Therapy:   Prior Outpatient Therapy:    Past Medical History:  Past Medical History:  Diagnosis Date  . Anxiety   . Breast cancer ([place])    16 yrs ago  . CHF (congestive heart failure) ([place])   . Diabetes mellitus without complication ([place])   . DJD (degenerative joint disease)   . Dysuria   . H/O total knee replacement    right  . HTN (hypertension)   . Hypercholesteremia   . Kidney stone   . Obesity   . Recurrent urinary tract infection   . SVT (supraventricular tachycardia) ([place])   . Urinary incontinence   . Vaginal atrophy   . Yeast vaginitis     Past Surgical History:  Procedure Laterality Date  . CARDIAC CATHETERIZATION    . CHOLECYSTECTOMY    . FOOT SURGERY    . JOINT REPLACEMENT    . MASTECTOMY Right 1997  . NASAL SEPTUM SURGERY    . PERIPHERAL VASCULAR CATHETERIZATION N/A 07/08/2015   Procedure: PICC Line Insertion;  Surgeon: Algernon Huxley, MD;  Location: [facility];  Service: Cardiovascular;  Laterality: N/A;  . TONSILLECTOMY    . Vocal cords     Family History:  Family History  Problem Relation Age of Onset  . Lung cancer Father   . Hematuria Mother   . Lung cancer Mother   . Kidney disease Neg Hx   . Bladder Cancer Neg Hx   . Breast cancer Neg Hx    Family Psychiatric   History: Some depression Social History:  History  Alcohol Use No     History  Drug Use No    Social History   Social History  . Marital status: Widowed    Spouse name: N/A  . Number of children: N/A  . Years of education: N/A   Social History Main Topics  . Smoking status: Former Research scientist (life sciences)  . Smokeless tobacco: Former Systems developer    Quit date: 10/29/1995  . Alcohol use No  . Drug use: No  . Sexual activity: No   Other Topics Concern  . None   Social History Narrative  . None   Additional Social History:    Allergies:   Allergies  Allergen Reactions  . Sulfa Antibiotics Hives  . Biaxin [Clarithromycin] Hives  . Influenza A (H1n1) Monoval Vac Other (See Comments)    Pt states that she was told by her MD not to get the influenza vaccine.    . Morphine Other (See Comments)    Reaction:  Dizziness and confusion   . Pyridium [Phenazopyridine Hcl] Other (See Comments)  Reaction:  Unknown   . Ceftriaxone Anxiety  . Latex Rash  . Prednisone Rash  . Tape Rash    Labs:  Results for orders placed or performed during the hospital encounter of 09/24/16 (from the past 48 hour(s))  Glucose, capillary     Status: Abnormal   Collection Time: 10/23/16  8:48 PM  Result Value Ref Range   Glucose-Capillary 181 (H) 65 - 99 mg/dL  Glucose, capillary     Status: Abnormal   Collection Time: 10/24/16 12:17 AM  Result Value Ref Range   Glucose-Capillary 198 (H) 65 - 99 mg/dL  Glucose, capillary     Status: None   Collection Time: 10/24/16  4:51 AM  Result Value Ref Range   Glucose-Capillary 97 65 - 99 mg/dL  Glucose, capillary     Status: None   Collection Time: 10/24/16  7:54 AM  Result Value Ref Range   Glucose-Capillary 85 65 - 99 mg/dL   Comment 1 Notify RN   Glucose, capillary     Status: Abnormal   Collection Time: 10/24/16 11:37 AM  Result Value Ref Range   Glucose-Capillary 208 (H) 65 - 99 mg/dL   Comment 1 Notify RN   Glucose, capillary     Status: Abnormal    Collection Time: 10/24/16  4:37 PM  Result Value Ref Range   Glucose-Capillary 308 (H) 65 - 99 mg/dL   Comment 1 Notify RN   Glucose, capillary     Status: Abnormal   Collection Time: 10/24/16  8:01 PM  Result Value Ref Range   Glucose-Capillary 238 (H) 65 - 99 mg/dL  Glucose, capillary     Status: Abnormal   Collection Time: 10/25/16 12:03 AM  Result Value Ref Range   Glucose-Capillary 266 (H) 65 - 99 mg/dL  Glucose, capillary     Status: Abnormal   Collection Time: 10/25/16  4:15 AM  Result Value Ref Range   Glucose-Capillary 178 (H) 65 - 99 mg/dL  Glucose, capillary     Status: Abnormal   Collection Time: 10/25/16  7:47 AM  Result Value Ref Range   Glucose-Capillary 180 (H) 65 - 99 mg/dL   Comment 1 Notify RN   Glucose, capillary     Status: Abnormal   Collection Time: 10/25/16 11:32 AM  Result Value Ref Range   Glucose-Capillary 334 (H) 65 - 99 mg/dL   Comment 1 Notify RN   Glucose, capillary     Status: Abnormal   Collection Time: 10/25/16  4:42 PM  Result Value Ref Range   Glucose-Capillary 259 (H) 65 - 99 mg/dL   Comment 1 Notify RN     Current Facility-Administered Medications  Medication Dose Route Frequency Provider Last Rate Last Dose  . acetaminophen (TYLENOL) tablet 650 mg  650 mg Oral Q6H PRN Gladstone Lighter, MD   650 mg at 10/24/16 2221   Or  . acetaminophen (TYLENOL) suppository 650 mg  650 mg Rectal Q6H PRN Gladstone Lighter, MD   650 mg at 10/21/16 0550  . alum & mag hydroxide-simeth (MAALOX/MYLANTA) 200-200-20 MG/5ML suspension 30 mL  30 mL Oral Q4H PRN Theodoro Grist, MD   30 mL at 09/25/16 2359  . amLODipine (NORVASC) tablet 10 mg  10 mg Oral Daily Theodoro Grist, MD   10 mg at 10/25/16 1024  . atorvastatin (LIPITOR) tablet 20 mg  20 mg Oral QHS Dustin Flock, MD   20 mg at 10/24/16 2224  . clonazePAM (KLONOPIN) tablet 0.25 mg  0.25 mg Oral BID  Gonzella Lex, MD   0.25 mg at 10/25/16 1024  . conjugated estrogens (PREMARIN) vaginal cream 1 Applicatorful   1 Applicatorful Vaginal Once per day on Mon Wed Fri Gladstone Lighter, MD   1 Applicatorful at 16/96/78 1325  . darifenacin (ENABLEX) 24 hr tablet 15 mg  15 mg Oral Daily Theodoro Grist, MD   15 mg at 10/25/16 1024  . desmopressin (DDAVP) tablet 0.2 mg  0.2 mg Oral QHS Gladstone Lighter, MD   0.2 mg at 10/24/16 2228  . docusate sodium (COLACE) capsule 100 mg  100 mg Oral BID Napoleon Form, RPH   100 mg at 10/25/16 1025  . enoxaparin (LOVENOX) injection 40 mg  40 mg Subcutaneous Q24H Rocky Morel, RPH   40 mg at 10/25/16 1724  . feeding supplement (ENSURE ENLIVE) (ENSURE ENLIVE) liquid 237 mL  237 mL Oral TID BM Epifanio Lesches, MD      . furosemide (LASIX) tablet 20 mg  20 mg Oral Daily Dustin Flock, MD   20 mg at 10/25/16 1025  . hydrALAZINE (APRESOLINE) injection 10 mg  10 mg Intravenous Q6H PRN Dustin Flock, MD      . insulin aspart (novoLOG) injection 0-15 Units  0-15 Units Subcutaneous Q4H Dustin Flock, MD   8 Units at 10/25/16 1724  . insulin glargine (LANTUS) injection 72 Units  72 Units Subcutaneous QHS Epifanio Lesches, MD      . iron polysaccharides (NIFEREX) capsule 150 mg  150 mg Oral TID Gladstone Lighter, MD   150 mg at 10/25/16 1724  . levETIRAcetam (KEPPRA) tablet 250 mg  250 mg Oral BID Alexis Goodell, MD      . loratadine (CLARITIN) tablet 10 mg  10 mg Oral Daily Gladstone Lighter, MD   10 mg at 10/25/16 1024  . metoCLOPramide (REGLAN) tablet 5 mg  5 mg Oral TID AC Theodoro Grist, MD   5 mg at 10/25/16 1726  . metoprolol succinate (TOPROL-XL) 24 hr tablet 50 mg  50 mg Oral BID Theodoro Grist, MD   50 mg at 10/25/16 1025  . nystatin (MYCOSTATIN/NYSTOP) topical powder   Topical TID Gladstone Lighter, MD      . OLANZapine zydis (ZYPREXA) disintegrating tablet 5 mg  5 mg Oral QHS Gonzella Lex, MD   5 mg at 10/24/16 2224  . ondansetron (ZOFRAN) tablet 4 mg  4 mg Oral Q6H PRN Gladstone Lighter, MD   4 mg at 09/29/16 1109   Or  . ondansetron (ZOFRAN) injection 4 mg  4  mg Intravenous Q6H PRN Gladstone Lighter, MD   4 mg at 09/27/16 0950  . pantoprazole (PROTONIX) EC tablet 40 mg  40 mg Oral BID Gladstone Lighter, MD   40 mg at 10/25/16 1025  . polyethylene glycol (MIRALAX / GLYCOLAX) packet 17 g  17 g Oral Daily Dustin Flock, MD   17 g at 10/25/16 1026  . polyvinyl alcohol (LIQUIFILM TEARS) 1.4 % ophthalmic solution 1 drop  1 drop Both Eyes PRN Theodoro Grist, MD      . potassium chloride SA (K-DUR,KLOR-CON) CR tablet 20 mEq  20 mEq Oral Daily Gladstone Lighter, MD   20 mEq at 10/25/16 1023  . senna (SENOKOT) tablet 8.6 mg  1 tablet Oral Daily Napoleon Form, RPH   8.6 mg at 10/25/16 1025  . sodium chloride flush (NS) 0.9 % injection 10-40 mL  10-40 mL Intracatheter Q12H Epifanio Lesches, MD   20 mL at 10/25/16 1026  . sodium chloride flush (  NS) 0.9 % injection 10-40 mL  10-40 mL Intracatheter PRN Epifanio Lesches, MD      . traZODone (DESYREL) tablet 50 mg  50 mg Oral QHS Loletha Grayer, MD   50 mg at 10/24/16 2228  . vitamin E capsule 1,000 Units  1,000 Units Oral Daily Gladstone Lighter, MD   1,000 Units at 10/25/16 1023   Facility-Administered Medications Ordered in Other Encounters  Medication Dose Route Frequency Provider Last Rate Last Dose  . 0.9 %  sodium chloride infusion    Continuous PRN Dionne Bucy, CRNA        Musculoskeletal: Strength & Muscle Tone: decreased Gait & Station: unable to stand Patient leans: N/A  Psychiatric Specialty Exam: Physical Exam  Nursing note and vitals reviewed. Constitutional: She appears well-developed and well-nourished.  HENT:  Head: Normocephalic and atraumatic.  Eyes: Conjunctivae are normal. Pupils are equal, round, and reactive to light.  Neck: Normal range of motion.  Cardiovascular: Regular rhythm and normal heart sounds.   Respiratory: Effort normal. No respiratory distress.  GI: Soft.  Musculoskeletal: Normal range of motion.  Neurological: She is alert.  Skin: Skin is warm and dry.   Psychiatric: Her affect is not blunt and not inappropriate. Her speech is delayed. She is not withdrawn. Cognition and memory are impaired. She expresses no homicidal and no suicidal ideation. She is communicative.    Review of Systems  Unable to perform ROS: Patient unresponsive  Psychiatric/Behavioral: Negative for depression, hallucinations, substance abuse and suicidal ideas. The patient is not nervous/anxious.     Blood pressure (!) 138/46, pulse 72, temperature 98.4 F (36.9 C), temperature source Oral, resp. rate 18, height 5\' 7"  (1.702 m), weight 115.2 kg (254 lb), SpO2 97 %.Body mass index is 39.78 kg/m.  General Appearance: Fairly Groomed  Eye Contact:  Good  Speech:  Clear and Coherent  Volume:  Decreased  Mood:  Euthymic  Affect:  Constricted  Thought Process:  Goal Directed  Orientation:  Full (Time, Place, and Person)  Thought Content:  Rumination and Tangential  Suicidal Thoughts:  No  Homicidal Thoughts:  No  Memory:  Immediate;   Fair Recent;   Fair Remote;   Poor  Judgement:  Fair  Insight:  Negative and Fair  Psychomotor Activity:  Negative  Concentration:  Concentration: Fair  Recall:  [employer] of Knowledge:  Fair  Language:  Fair  Akathisia:  Negative  Handed:  Right  AIMS (if indicated):     Assets:  Social Support  ADL's:  Impaired  Cognition:  WNL  Sleep:        Treatment Plan Summary: Daily contact with patient to assess and evaluate symptoms and progress in treatment, Medication management and Plan Dramatic improvement. Mental status improved. Still looks a little bit sluggish and out of bed and probably not at baseline but she is eating a little better. Sounds like she was able to sit up on the side of the bed the other day. At this point I would prefer not to restart ECT although I still don't think ECT was involved in her decline in mental state last week. I will continue the low dose of olanzapine and she has been getting. It looks like she  might be getting more cooperative and possibly could go to rehabilitation?. I will continue to follow-up as needed.  Disposition: No evidence of imminent risk to self or others at present.   Patient does not meet criteria for psychiatric inpatient admission. Supportive therapy provided  about ongoing stressors.  Alethia Berthold, MD 10/25/2016 7:16 PM

## 2016-10-25 NOTE — Progress Notes (Signed)
Inpatient Diabetes Program Recommendations  AACE/ADA: New Consensus Statement on Inpatient Glycemic Control (2015)  Target Ranges:  Prepandial:   less than 140 mg/dL      Peak postprandial:   less than 180 mg/dL (1-2 hours)      Critically ill patients:  140 - 180 mg/dL   Results for DOMINGUE, COLTRAIN (MRN [id number]) as of 10/25/2016 09:15  Ref. Range 10/24/2016 04:51 10/24/2016 07:54 10/24/2016 11:37 10/24/2016 16:37 10/24/2016 20:01 10/25/2016 00:03 10/25/2016 04:15 10/25/2016 07:47  Glucose-Capillary Latest Ref Range: 65 - 99 mg/dL 97 85 208 (H) 308 (H) 238 (H) 266 (H) 178 (H) 180 (H)   Review of Glycemic Control  Diabetes history: DM2 Outpatient Diabetes medications: Lantus 50 units QHS, Novolog 0-20 units TID, Actos 30 mg daily Current orders for Inpatient glycemic control: Lantus 67 units QHS, Novolog 0-15 units Q4H  Inpatient Diabetes Program Recommendations: Insulin - Basal: Please consider increasing Lantus to 72 units QHS. Diet: If appropriate, please consider changing Ensure supplements to Glucerna.  Thanks, Barnie Alderman, RN, MSN, CDE Diabetes Coordinator Inpatient Diabetes Program [phone] (Team Pager from 8am to 5pm)

## 2016-10-26 LAB — BASIC METABOLIC PANEL
Anion gap: 4 — ABNORMAL LOW (ref 5–15)
BUN: 40 mg/dL — AB (ref 6–20)
CALCIUM: 9.2 mg/dL (ref 8.9–10.3)
CO2: 35 mmol/L — AB (ref 22–32)
Chloride: 96 mmol/L — ABNORMAL LOW (ref 101–111)
Creatinine, Ser: 0.85 mg/dL (ref 0.44–1.00)
GFR calc non Af Amer: >60 mL/min (ref >60)
Glucose, Bld: 96 mg/dL (ref 65–99)
Potassium: 4 mmol/L (ref 3.5–5.1)
Sodium: 135 mmol/L (ref 135–145)

## 2016-10-26 LAB — GLUCOSE, CAPILLARY
GLUCOSE-CAPILLARY: 109 mg/dL — AB (ref 65–99)
GLUCOSE-CAPILLARY: 274 mg/dL — AB (ref 65–99)
Glucose-Capillary: 158 mg/dL — ABNORMAL HIGH (ref 65–99)
Glucose-Capillary: 157 mg/dL — ABNORMAL HIGH (ref 65–99)

## 2016-10-26 MED ORDER — AMLODIPINE BESYLATE 10 MG PO TABS: 10.0000 mg | ORAL_TABLET | Freq: Every day | ORAL | 0 refills | Status: DC

## 2016-10-26 MED ORDER — ENSURE ENLIVE PO LIQD: 237.0000 mL | Freq: Three times a day (TID) | ORAL | 12 refills | Status: DC

## 2016-10-26 MED ORDER — INSULIN GLARGINE 100 UNIT/ML ~~LOC~~ SOLN: 72.0000 [IU] | Freq: Every day | SUBCUTANEOUS | 11 refills | Status: DC

## 2016-10-26 MED ORDER — LEVETIRACETAM 250 MG PO TABS: 250.0000 mg | ORAL_TABLET | Freq: Two times a day (BID) | ORAL | 0 refills | Status: AC

## 2016-10-26 MED ORDER — OLANZAPINE 5 MG PO TBDP: 5.0000 mg | ORAL_TABLET | Freq: Every day | ORAL | 0 refills | Status: DC

## 2016-10-26 NOTE — Progress Notes (Signed)
EMS arrived to pick patient up.

## 2016-10-26 NOTE — Progress Notes (Signed)
Inpatient Diabetes Program Recommendations  AACE/ADA: New Consensus Statement on Inpatient Glycemic Control (2015)  Target Ranges:  Prepandial:   less than 140 mg/dL      Peak postprandial:   less than 180 mg/dL (1-2 hours)      Critically ill patients:  140 - 180 mg/dL   Results for KRISNA, OMAR (MRN [id number]) as of 10/26/2016 10:03  Ref. Range 10/25/2016 04:15 10/25/2016 07:47 10/25/2016 11:32 10/25/2016 16:42 10/25/2016 20:19 10/26/2016 02:37 10/26/2016 07:42  Glucose-Capillary Latest Ref Range: 65 - 99 mg/dL 178 (H) 180 (H) 334 (H) 259 (H) 308 (H) 158 (H) 157 (H)   Review of Glycemic Control  Outpatient Diabetes medications: Lantus 50 units QHS, Novolog 0-20 units TID, Actos 30 mg daily Current orders for Inpatient glycemic control: Lantus 72 units QHS, Novolog 0-15 units Q4H  Inpatient Diabetes Program Recommendations: Insulin - Basal: Noted Lantus was increased to 72 units QHS and patient received last night and glucose trending improved. Insulin - Ensure Coverage: Discussed Ensure versus Glucerna with RD on 10/25/16 and RD reports patient needs Ensure Enlive. Glucose elevated after Ensure supplements; therefore recommend covering Ensure with Novolog coverage (in addition to Novolog correction).  Please consider ordering Novolog 3 units TID (administration times: 10:00, 14:00, 20:00) for Ensure Enlive supplements.  Thanks, Barnie Alderman, RN, MSN, CDE Diabetes Coordinator Inpatient Diabetes Program [phone] (Team Pager from 8am to 5pm)

## 2016-10-26 NOTE — Clinical Social Work Placement (Signed)
   CLINICAL SOCIAL WORK PLACEMENT  NOTE  Date:  10/26/2016  Patient Details  Name: Carolyn Valdez MRN: [id number] Date of Birth: 1941/07/18  Clinical Social Work is seeking post-discharge placement for this patient at the [place] level of care (*CSW will initial, date and re-position this form in  chart as items are completed):  Yes   Patient/family provided with [place] Work Department's list of facilities offering this level of care within the geographic area requested by the patient (or if unable, by the patient's family).  Yes   Patient/family informed of their freedom to choose among providers that offer the needed level of care, that participate in Medicare, Medicaid or managed care program needed by the patient, have an available bed and are willing to accept the patient.  Yes   Patient/family informed of [facility]'s ownership interest in [facility] and [facility], as well as of the fact that they are under no obligation to receive care at these facilities.  PASRR submitted to EDS on 10/22/16     PASRR number received on 10/26/16     Existing PASRR number confirmed on       FL2 transmitted to all facilities in geographic area requested by pt/family on 10/19/16     FL2 transmitted to all facilities within larger geographic area on       Patient informed that his/her managed care company has contracts with or will negotiate with certain facilities, including the following:        Yes   Patient/family informed of bed offers received.  Patient chooses bed at  [facility])     Physician recommends and [facility]    Patient to be transferred to  [facility]) on 10/26/16.  Patient to be transferred to facility by  (EMS)     Patient family notified on 10/26/16 of transfer.  Name of family member notified:   (MD notified [name]; patient's sister)     PHYSICIAN       Additional Comment:     _______________________________________________ Shela Leff, LCSW 10/26/2016, 10:44 AM

## 2016-10-26 NOTE — Discharge Summary (Signed)
Carolyn Valdez, is a 76 y.o. female  DOB [birth year]  MRN [id number].  Admission date:  09/24/2016  Admitting Physician  Gladstone Lighter, MD  Discharge Date:  10/26/2016   Primary MD  Leonel Ramsay, MD  Recommendations for primary care physician for things to follow:     Admission Diagnosis  Lower urinary tract infectious disease [N39.0] Urinary retention [R33.9] Weakness [R53.1] Gastrointestinal hemorrhage, unspecified gastrointestinal hemorrhage type [K92.2]   Discharge Diagnosis  Lower urinary tract infectious disease [N39.0] Urinary retention [R33.9] Weakness [R53.1] Gastrointestinal hemorrhage, unspecified gastrointestinal hemorrhage type [K92.2]   Principal Problem:   Severe major depression, single episode, with psychotic features ([place]) Active Problems:   Altered mental status   Gastrointestinal hemorrhage   Diffuse abdominal pain   Subacute delirium   Cellulitis of leg, right   Proctitis   Hypercapnia   Acute urinary retention   UTI (urinary tract infection)   Weakness   DNR (do not resuscitate)   Palliative care by specialist   Depression, major, recurrent, severe with psychosis ([place])   Palliative care encounter   Goals of care, counseling/discussion      Past Medical History:  Diagnosis Date  . Anxiety   . Breast cancer ([place])    16 yrs ago  . CHF (congestive heart failure) ([place])   . Diabetes mellitus without complication ([place])   . DJD (degenerative joint disease)   . Dysuria   . H/O total knee replacement    right  . HTN (hypertension)   . Hypercholesteremia   . Kidney stone   . Obesity   . Recurrent urinary tract infection   . SVT (supraventricular tachycardia) ([place])   . Urinary incontinence   . Vaginal atrophy   . Yeast vaginitis     Past Surgical History:  Procedure  Laterality Date  . CARDIAC CATHETERIZATION    . CHOLECYSTECTOMY    . FOOT SURGERY    . JOINT REPLACEMENT    . MASTECTOMY Right 1997  . NASAL SEPTUM SURGERY    . PERIPHERAL VASCULAR CATHETERIZATION N/A 07/08/2015   Procedure: PICC Line Insertion;  Surgeon: Algernon Huxley, MD;  Location: [place] CV LAB;  Service: Cardiovascular;  Laterality: N/A;  . TONSILLECTOMY    . Vocal cords         History of present illness and  Hospital Course:     Kindly see H&P for history of present illness and admission details, please review complete Labs, Consult reports and Test reports for all details in brief     Hospital Course  76 year old female patient with history of multiple medical problems, recurrent medical admissions comes in from home because of confusion, urinary retention. Admitted to hospitalist service for altered mental status, initial CT head, blood cultures did not show any growth. Patient had previous history of MRSA UTI, ESBL UTI received vancomycin, meropenem. And patient had urinary retention Foley catheter was placed.  patient has history of intermittent urinary retention:  Due  altered mental status her sedatives were held including Neurontin and Klonopin ,benzos. #2 constipation and abdominal pain CT showed  Stercoral  ulcer causing pain and inflammation: Patient started on MiraLAX for constipation. Patient does not need flexible sigmoidoscopy as per GI note. Patient received stool softeners, and continue stool softeners as needed for constipation. #3 generalized weakness physical therapy recommended. [facility] Updated  patient's sister [name].  3. Mild depression: Hallucinations, seen by psychiatriy.  4. Altered mental status: Patient MRI d  brain id not show any acute changes. Seen by neurology, psychiatric. EEG significant artifact without epileptiform changes.  Background slowing. Theneurologist fat pad that is metabolic  encephalopathy secondary to multiple causes including hypercarbia, hypoxemia, probable infections, renal function. Patient also seen by psychiatrist because of altered mental status, patient received ECT treatment on January 3. On January 4 patient had twitching movements.  head CT did not show any acute changes. Patient ABG did not show any new changes. Chest x-ray did not show pneumonia. Patient also had fever, we felt that patient had fever and twitching likely due to combination of ECT, fever/twitching probably secondary to both. But psychiatric did not feel that seizure or twitching episode related to ECT. However patient's fever subsided, patient received antibiotics for possible ear infection. Urine culture didn't show any growth. Patient noted new to be poorly responsive. g. Patient seen by neurology, started on IVkeppra. Patient mental status gradually improved from January 5 evening onwards, now she is alert, awake, oriented. Started  communicate. Neurologist recommended to continue Keppra 250 mg by mouth twice a day until seen by primary doctor. Psychiatric does not think she needs any furthernpatient  treatment. Continue low-dose olanzapine that she is already getting.   5 GERD continue Protonix No . 6 essential hypertension: Continue Norvasc, Toprol  7 hyperlipidemia: Continue statins #8. diabetes type 2: Patient is on Lantus, sliding scale with coverage. High risk for recurrent admissions.   Updated patient's sister every day and also today about the discharge plan.  CODE STATUS DO NOT RESUSCITATE.   Discharge Condition:stable   Follow UP   Contact information for follow-up providers    WOLFF,MICHAEL R, MD Follow up in 2 day(s).   Specialty:  Urology Contact information: [place] [address] [phone]            Contact information for after-discharge care    [facility] .   Specialty:  [place] information: [place] [phone]                    Discharge Instructions  and  Discharge Medications    Discharge Instructions    Diet - low sodium heart healthy    Complete by:  As directed    Increase activity slowly    Complete by:  As directed      Allergies as of 10/26/2016      Reactions   Sulfa Antibiotics Hives   Biaxin [clarithromycin] Hives   Influenza A (h1n1) Mon[place] Vac Other (See Comments)   Pt states that she was told by her MD not to get the influenza vaccine.     Morphine Other (See Comments)   Reaction:  Dizziness and confusion    Pyridium [phenazopyridine Hcl] Other (See Comments)   Reaction:  Unknown    Ceftriaxone Anxiety   Latex Rash   Prednisone Rash   Tape Rash      Medication List    STOP taking these medications   clonazePAM 1 MG tablet Commonly known as:  KLONOPIN   furosemide 20 MG tablet Commonly known as:  LASIX   hydrOXYzine 25 MG tablet Commonly known as:  ATARAX/VISTARIL   potassium chloride SA 20 MEQ tablet Commonly known as:  K-DUR,KLOR-CON     TAKE these medications   amLODipine 2.5 MG tablet Commonly known as:  NORVASC Take 1 tablet (2.5 mg total) by mouth daily. What changed:  Another medication with the same name was added. Make sure you understand how and when to take each.   amLODipine 10 MG tablet Commonly known as:  NORVASC Take 1 tablet (10 mg total) by mouth daily. What changed:  You were already taking a medication with the same name, and this prescription was added. Make sure you understand how and when to take each.   amoxicillin-clavulanate 600-42.9 MG/5ML suspension Commonly known as:  AUGMENTIN Take 4.2 mLs (500 mg total) by mouth 2 (two) times daily.   atorvastatin 20 MG tablet Commonly known as:  LIPITOR Take 20 mg by mouth at bedtime.   conjugated estrogens vaginal cream Commonly known as:  PREMARIN Place 1 Applicatorful  vaginally 3 (three) times a week. Pt uses on Monday, Wednesday, and Friday.   desmopressin 0.2 MG tablet Commonly known as:  DDAVP Take 1 tablet (0.2 mg total) by mouth at bedtime.   docusate sodium 100 MG capsule Commonly known as:  COLACE Take 200 mg by mouth 2 (two) times daily.   feeding supplement (ENSURE ENLIVE) Liqd Take 237 mLs by mouth 3 (three) times daily between meals.   fexofenadine 180 MG tablet Commonly known as:  ALLEGRA Take 180 mg by mouth daily.   gabapentin 100 MG capsule Commonly known as:  NEURONTIN Take 1 capsule (100 mg total) by mouth 3 (three) times daily as needed. What changed:  medication strength  how much to take  when to take this  reasons to take this  additional instructions   insulin aspart 100 UNIT/ML injection Commonly known as:  novoLOG Inject 0-20 Units into the skin 3 (three) times daily with meals.   insulin aspart 100 UNIT/ML injection Commonly known as:  novoLOG Inject 0-5 Units into the skin at bedtime.   insulin glargine 100 UNIT/ML injection Commonly known as:  LANTUS Inject 0.72 mLs (72 Units total) into the skin at bedtime. What changed:  how much to take   iron polysaccharides 150 MG capsule Commonly known as:  NIFEREX Take 150 mg by mouth 3 (three) times daily.   levETIRAcetam 250 MG tablet Commonly known as:  KEPPRA Take 1 tablet (250 mg total) by mouth 2 (two) times daily.   metoCLOPramide 5 MG tablet Commonly known as:  REGLAN Take 1 tablet (5 mg total) by mouth every 8 (eight) hours as needed for nausea or vomiting.   metoprolol succinate 50 MG 24 hr tablet Commonly known as:  TOPROL-XL Take 1 tablet (50 mg total) by mouth 2 (two) times daily. Take with or immediately following a meal. What changed:  medication strength  how much to take  additional instructions   nystatin powder Commonly known as:  MYCOSTATIN/NYSTOP Apply topically 3 (three) times daily.   OLANZapine zydis 5 MG  disintegrating tablet Commonly known as:  ZYPREXA Take 1 tablet (5 mg total) by mouth at bedtime.   omeprazole 40 MG capsule Commonly known as:  PRILOSEC Take 40 mg by mouth 2 (two) times daily.   ondansetron 4 MG disintegrating tablet Commonly known as:  ZOFRAN ODT Take 1 tablet (4 mg total) by mouth every 8 (eight) hours as needed for nausea or vomiting.   pioglitazone 30 MG tablet Commonly known as:  ACTOS Take 30 mg by mouth daily.   senna 8.6 MG Tabs tablet Commonly known as:  SENOKOT Take 1 tablet (8.6 mg total) by mouth daily.   VESICARE 10 MG tablet Generic drug:  solifenacin Take 1 tablet (10 mg total) by mouth daily.  vitamin E 1000 UNIT capsule Take 1,000 Units by mouth daily.         Diet and Activity recommendation: See Discharge Instructions above   Consults obtained - neurology,psychiatry   Major procedures and Radiology Reports - PLEASE review detailed and final reports for all details, in brief -      Dg Chest 1 View  Result Date: 10/21/2016 CLINICAL DATA:  Fever, history of breast carcinoma EXAM: CHEST 1 VIEW COMPARISON:  Chest x-ray of 09/11/2016 FINDINGS: There is opacity at the left lung base which may represent atelectasis or developing pneumonia. The right lung is clear. Mediastinal and hilar contours are unremarkable. The heart is mildly enlarged and stable. No bony abnormality is seen. IMPRESSION: 1. Vague opacity at the left lung base may represent atelectasis or pneumonia. Recommend continued follow-up with two-view chest x-ray. 2. Stable cardiomegaly Electronically Signed   By: Ivar Drape M.D.   On: 10/21/2016 08:51   Ct Head Wo Contrast  Result Date: 10/21/2016 CLINICAL DATA:  Confusion. EXAM: CT HEAD WITHOUT CONTRAST TECHNIQUE: Contiguous axial images were obtained from the base of the skull through the vertex without intravenous contrast. COMPARISON:  CT scan of September 24, 2016. FINDINGS: Brain: Mild chronic ischemic white matter  disease is noted. No mass effect or midline shift is noted. Ventricular size is within normal limits. There is no evidence of mass lesion, hemorrhage or acute infarction. Vascular: No hyperdense vessel or unexpected calcification. Skull: Normal. Negative for fracture or focal lesion. Sinuses/Orbits: No acute finding. Other: None. IMPRESSION: Mild chronic ischemic white matter disease. No acute intracranial abnormality seen. Electronically Signed   By: Marijo Conception, M.D.   On: 10/21/2016 13:46   Mr Brain Wo Contrast  Result Date: 10/22/2016 CLINICAL DATA:  Seizure.  Fever. EXAM: MRI HEAD WITHOUT CONTRAST TECHNIQUE: Multiplanar, multiecho pulse sequences of the brain and surrounding structures were obtained without intravenous contrast. COMPARISON:  CT of the head without contrast 10/21/2016. MRI brain 09/30/2016 FINDINGS: Brain: Moderate diffuse periventricular and subcortical T2 changes again seen bilaterally. Remote lacunar infarcts are present within the basal ganglia. Dedicated imaging of the temporal lobes demonstrate symmetric T2 changes within the hippocampi. No discrete mass lesion is present. Vascular: The flow is present in the major intracranial arteries. Skull and upper cervical spine: The skullbase is within normal limits. The craniocervical junction is unremarkable. Midline sagittal structures are within normal limits. Sinuses/Orbits: Minimal fluid is present in the right maxillary sinus. There is mild mucosal thickening along the floor of the left maxillary sinus. The paranasal sinuses are otherwise clear. The mastoid air cells are clear. IMPRESSION: 1. No acute or focal lesion to explain seizures. 2. Moderate generalized atrophy and white matter disease. This likely reflects the sequela of chronic microvascular ischemia. Electronically Signed   By: San Morelle M.D.   On: 10/22/2016 12:54   Mr Brain Wo Contrast  Result Date: 09/30/2016 CLINICAL DATA:  Confusion EXAM: MRI HEAD  WITHOUT CONTRAST TECHNIQUE: Multiplanar, multiecho pulse sequences of the brain and surrounding structures were obtained without intravenous contrast. COMPARISON:  CT head 09/24/2016 FINDINGS: Brain: Image quality degraded by motion Negative for acute infarct. Mild chronic microvascular ischemic change in the white matter. Negative for hemorrhage or mass. Ventricle size within normal limits. Vascular: Normal arterial flow voids. Skull and upper cervical spine: Negative Sinuses/Orbits: Negative Other: None IMPRESSION: Image quality degraded by motion.  No acute abnormality. Electronically Signed   By: Franchot Gallo M.D.   On: 09/30/2016 11:44  Micro Results    Recent Results (from the past 240 hour(s))  Urine culture     Status: Abnormal   Collection Time: 10/21/16  9:06 AM  Result Value Ref Range Status   Specimen Description URINE, RANDOM  Final   Special Requests NONE  Final   Culture MULTIPLE SPECIES PRESENT, SUGGEST RECOLLECTION (A)  Final   Report Status 10/22/2016 FINAL  Final       Today   Subjective:   Ila Mcgill today,stable for  discharge  Objective:   Blood pressure (!) 140/46, pulse 74, temperature 98.6 F (37 C), temperature source Oral, resp. rate 18, height 5\' 7"  (1.702 m), weight 115.2 kg (254 lb), SpO2 92 %.   Intake/Output Summary (Last 24 hours) at 10/26/16 1037 Last data filed at 10/26/16 0600  Gross per 24 hour  Intake              470 ml  Output                0 ml  Net              470 ml    Exam Awake Alert, Oriented x 3, No new F.N deficits, Normal affect .AT,PERRAL Supple Neck,No JVD, No cervical lymphadenopathy appriciated.  Symmetrical Chest wall movement, Good air movement bilaterally, CTAB RRR,No Gallops,Rubs or new Murmurs, No Parasternal Heave +ve B.Sounds, Abd Soft, Non tender, No organomegaly appriciated, No rebound -guarding or rigidity. No Cyanosis, Clubbing or edema, No new Rash or bruise  Data Review   CBC w Diff: Lab Results   Component Value Date   WBC 7.1 10/23/2016   HGB 11.6 (L) 10/23/2016   HGB 10.1 (L) 10/31/2014   HCT 34.1 (L) 10/23/2016   HCT 32.3 (L) 10/31/2014   PLT 284 10/23/2016   PLT 254 10/31/2014   LYMPHOPCT 16 08/31/2016   LYMPHOPCT 26.4 10/31/2014   MONOPCT 12 08/31/2016   MONOPCT 12.6 10/31/2014   EOSPCT 2 08/31/2016   EOSPCT 5.9 10/31/2014   BASOPCT 1 08/31/2016   BASOPCT 0.5 10/31/2014    CMP: Lab Results  Component Value Date   NA 135 10/26/2016   NA 137 09/08/2014   K 4.0 10/26/2016   K 5.2 (H) 09/08/2014   CL 96 (L) 10/26/2016   CL 103 09/08/2014   CO2 35 (H) 10/26/2016   CO2 28 09/08/2014   BUN 40 (H) 10/26/2016   BUN 16 09/08/2014   CREATININE 0.85 10/26/2016   CREATININE 1.45 (H) 09/08/2014   PROT 6.0 (L) 10/23/2016   PROT 7.1 05/22/2014   ALBUMIN 2.7 (L) 10/23/2016   ALBUMIN 3.2 (L) 05/22/2014   BILITOT 0.9 10/23/2016   BILITOT 0.3 05/22/2014   ALKPHOS 43 10/23/2016   ALKPHOS 62 05/22/2014   AST 19 10/23/2016   AST 26 05/22/2014   ALT 14 10/23/2016   ALT 22 05/22/2014  .   Total Time in preparing paper work, data evaluation and todays exam - 4 minutes  Keon Pender M.D on 10/26/2016 at 10:37 AM    Note: This dictation was prepared with Dragon dictation along with smaller phrase technology. Any transcriptional errors that result from this process are unintentional.

## 2016-10-26 NOTE — Clinical Social Work Note (Signed)
Physician is discharging patient to [facility] today and has spoken to patient's sister: [name] and made her aware of the discharge. CSW spoke with Dr. Weber Cooks and he believes that patient will be best suited at [facility] at this time and that she does not require inpatient psych hospitalization. Dr. Vianne Bulls has spoken to patient's sister at length and informed her that [facility] is currently the only bed offer and that due to her being ready for discharge today, this is the facility she will need to go to as she has informed staff that she is unable to care for her sister numerous times. [facility] is aware of patient's discharge and discharge information to be sent to [facility] and patient will transport via EMS. Shela Leff MSW,LCSW [phone]

## 2016-10-26 NOTE — Progress Notes (Signed)
Patient Report called to [place] at [facility]. Patient PICC line removed. EMS notified. Patient resting in bed. Continue to assess.

## 2016-11-10 ENCOUNTER — Telehealth: Payer: Self-pay

## 2016-11-10 DIAGNOSIS — Z853 Personal history of malignant neoplasm of breast: Secondary | ICD-10-CM

## 2016-11-10 NOTE — Telephone Encounter (Signed)
-----   Message from [place], [place] sent at 12/31/2015  4:26 PM EDT ----- Regarding: Mammogram Call [facility] and schedule patient her Diagnostic Mammogram and then her follow-up appointment with Dr. Azalee Course.

## 2016-11-10 NOTE — Telephone Encounter (Signed)
I scheduled patient's mammogram and follow up appointment with Dr. Adonis Huguenin. I will mail patient her reminder by mail.

## 2016-11-15 ENCOUNTER — Ambulatory Visit (INDEPENDENT_AMBULATORY_CARE_PROVIDER_SITE_OTHER): Payer: Medicare Other | Admitting: Vascular Surgery

## 2016-12-15 ENCOUNTER — Ambulatory Visit: Payer: Medicare Other | Attending: General Surgery

## 2016-12-16 ENCOUNTER — Telehealth: Payer: Self-pay

## 2016-12-16 ENCOUNTER — Other Ambulatory Visit: Payer: Self-pay

## 2016-12-16 NOTE — Telephone Encounter (Signed)
Called patient again and was not able to leave her voicemail. Since I have not been able to get in contact with the patient, I will have to cancel her appointment.  Once patient calls [facility], she will need to call us to let us know when she rescheduled it so we could schedule an appointment with Dr. Hortencia Conradi to go over results. I will send a letter to the patient with the following information.

## 2016-12-16 NOTE — Telephone Encounter (Signed)
Called patient and was not able to leave her a voicemail. Her phone doesn't have the ability to leave a voicemail. I had mailed her a reminder to have a mammogram and a follow up appointment with Dr. Adonis Huguenin on 11/10/2016 since she did not answer call in January. I will try to call her again later on today.   Patient needs to call [facility] to reschedule her mammogram and then we will need to reschedule her appointment with Dr. Adonis Huguenin.

## 2016-12-22 ENCOUNTER — Ambulatory Visit: Payer: Medicare Other | Admitting: General Surgery

## 2016-12-23 ENCOUNTER — Telehealth: Payer: Self-pay

## 2016-12-23 NOTE — Telephone Encounter (Signed)
Called patient's sister [name]) because I had not heard back from the patient after me calling her and sending her a letter to call me back. Mrs. Huey Bienenstock stated that her sister had been in rehab since her health has not been to well. I explained to Mrs. Hooper that the reason I was trying to get in touch with her sister is because I had sent a message to remind Mrs. Shirer that she was due for a follow up appointment with our surgeon. Mrs. Hooper asked me to call her sister back in two months for a possibility for her sister to be home then. I told her that I would do so.

## 2017-01-23 ENCOUNTER — Emergency Department: Payer: Medicare Other

## 2017-01-23 ENCOUNTER — Inpatient Hospital Stay
Admission: EM | Admit: 2017-01-23 | Discharge: 2017-02-04 | DRG: 291 | Disposition: A | Discharge status: Home Health | Payer: Medicare Other | Attending: Internal Medicine | Admitting: Internal Medicine

## 2017-01-23 ENCOUNTER — Encounter: Payer: Self-pay | Admitting: *Deleted

## 2017-01-23 DIAGNOSIS — E875 Hyperkalemia: Secondary | ICD-10-CM | POA: Diagnosis present

## 2017-01-23 DIAGNOSIS — F202 Catatonic schizophrenia: Secondary | ICD-10-CM | POA: Diagnosis present

## 2017-01-23 DIAGNOSIS — Z801 Family history of malignant neoplasm of trachea, bronchus and lung: Secondary | ICD-10-CM

## 2017-01-23 DIAGNOSIS — Z888 Allergy status to other drugs, medicaments and biological substances status: Secondary | ICD-10-CM

## 2017-01-23 DIAGNOSIS — Z6841 Body Mass Index (BMI) 40.0 and over, adult: Secondary | ICD-10-CM

## 2017-01-23 DIAGNOSIS — E7800 Pure hypercholesterolemia, unspecified: Secondary | ICD-10-CM | POA: Diagnosis present

## 2017-01-23 DIAGNOSIS — J9622 Acute and chronic respiratory failure with hypercapnia: Secondary | ICD-10-CM | POA: Diagnosis present

## 2017-01-23 DIAGNOSIS — Z87891 Personal history of nicotine dependence: Secondary | ICD-10-CM

## 2017-01-23 DIAGNOSIS — E119 Type 2 diabetes mellitus without complications: Secondary | ICD-10-CM | POA: Diagnosis present

## 2017-01-23 DIAGNOSIS — R569 Unspecified convulsions: Secondary | ICD-10-CM | POA: Diagnosis present

## 2017-01-23 DIAGNOSIS — Z881 Allergy status to other antibiotic agents status: Secondary | ICD-10-CM

## 2017-01-23 DIAGNOSIS — F4322 Adjustment disorder with anxiety: Secondary | ICD-10-CM

## 2017-01-23 DIAGNOSIS — J9811 Atelectasis: Secondary | ICD-10-CM | POA: Diagnosis present

## 2017-01-23 DIAGNOSIS — Z887 Allergy status to serum and vaccine status: Secondary | ICD-10-CM

## 2017-01-23 DIAGNOSIS — R161 Splenomegaly, not elsewhere classified: Secondary | ICD-10-CM | POA: Diagnosis present

## 2017-01-23 DIAGNOSIS — F329 Major depressive disorder, single episode, unspecified: Secondary | ICD-10-CM | POA: Diagnosis present

## 2017-01-23 DIAGNOSIS — G4733 Obstructive sleep apnea (adult) (pediatric): Secondary | ICD-10-CM | POA: Diagnosis present

## 2017-01-23 DIAGNOSIS — E785 Hyperlipidemia, unspecified: Secondary | ICD-10-CM | POA: Diagnosis present

## 2017-01-23 DIAGNOSIS — Z91048 Other nonmedicinal substance allergy status: Secondary | ICD-10-CM

## 2017-01-23 DIAGNOSIS — J9621 Acute and chronic respiratory failure with hypoxia: Secondary | ICD-10-CM | POA: Diagnosis present

## 2017-01-23 DIAGNOSIS — I110 Hypertensive heart disease with heart failure: Principal | ICD-10-CM | POA: Diagnosis present

## 2017-01-23 DIAGNOSIS — E871 Hypo-osmolality and hyponatremia: Secondary | ICD-10-CM | POA: Diagnosis present

## 2017-01-23 DIAGNOSIS — I509 Heart failure, unspecified: Secondary | ICD-10-CM | POA: Diagnosis present

## 2017-01-23 DIAGNOSIS — Z853 Personal history of malignant neoplasm of breast: Secondary | ICD-10-CM | POA: Diagnosis not present

## 2017-01-23 DIAGNOSIS — K219 Gastro-esophageal reflux disease without esophagitis: Secondary | ICD-10-CM | POA: Diagnosis present

## 2017-01-23 DIAGNOSIS — I5033 Acute on chronic diastolic (congestive) heart failure: Secondary | ICD-10-CM | POA: Diagnosis present

## 2017-01-23 DIAGNOSIS — Z87440 Personal history of urinary (tract) infections: Secondary | ICD-10-CM

## 2017-01-23 DIAGNOSIS — D638 Anemia in other chronic diseases classified elsewhere: Secondary | ICD-10-CM | POA: Diagnosis present

## 2017-01-23 DIAGNOSIS — N390 Urinary tract infection, site not specified: Secondary | ICD-10-CM | POA: Diagnosis present

## 2017-01-23 DIAGNOSIS — Z66 Do not resuscitate: Secondary | ICD-10-CM | POA: Diagnosis present

## 2017-01-23 DIAGNOSIS — R0602 Shortness of breath: Secondary | ICD-10-CM

## 2017-01-23 DIAGNOSIS — R32 Unspecified urinary incontinence: Secondary | ICD-10-CM | POA: Diagnosis present

## 2017-01-23 DIAGNOSIS — Z91040 Latex allergy status: Secondary | ICD-10-CM

## 2017-01-23 DIAGNOSIS — Z882 Allergy status to sulfonamides status: Secondary | ICD-10-CM

## 2017-01-23 DIAGNOSIS — Z515 Encounter for palliative care: Secondary | ICD-10-CM | POA: Diagnosis not present

## 2017-01-23 DIAGNOSIS — Z794 Long term (current) use of insulin: Secondary | ICD-10-CM

## 2017-01-23 DIAGNOSIS — Z7189 Other specified counseling: Secondary | ICD-10-CM | POA: Diagnosis not present

## 2017-01-23 LAB — URINALYSIS, ROUTINE W REFLEX MICROSCOPIC
BILIRUBIN URINE: NEGATIVE
Glucose, UA: NEGATIVE mg/dL
Ketones, ur: NEGATIVE mg/dL
Nitrite: NEGATIVE
PH: 6 (ref 5.0–8.0)
Protein, ur: 100 mg/dL — AB
SPECIFIC GRAVITY, URINE: 1.012 (ref 1.005–1.030)

## 2017-01-23 LAB — GLUCOSE, CAPILLARY
GLUCOSE-CAPILLARY: 87 mg/dL (ref 65–99)
Glucose-Capillary: 92 mg/dL (ref 65–99)
Glucose-Capillary: 131 mg/dL — ABNORMAL HIGH (ref 65–99)

## 2017-01-23 LAB — COMPREHENSIVE METABOLIC PANEL
ALT: 9 U/L — AB (ref 14–54)
AST: 15 U/L (ref 15–41)
Albumin: 3.1 g/dL — ABNORMAL LOW (ref 3.5–5.0)
Alkaline Phosphatase: 74 U/L (ref 38–126)
Anion gap: 6 (ref 5–15)
BUN: 28 mg/dL — AB (ref 6–20)
CHLORIDE: 90 mmol/L — AB (ref 101–111)
CO2: 37 mmol/L — ABNORMAL HIGH (ref 22–32)
Calcium: 9.8 mg/dL (ref 8.9–10.3)
Creatinine, Ser: 1.08 mg/dL — ABNORMAL HIGH (ref 0.44–1.00)
GFR, EST AFRICAN AMERICAN: 57 mL/min — AB (ref >60)
GFR, EST NON AFRICAN AMERICAN: 49 mL/min — AB (ref >60)
Glucose, Bld: 98 mg/dL (ref 65–99)
Potassium: 5 mmol/L (ref 3.5–5.1)
Sodium: 133 mmol/L — ABNORMAL LOW (ref 135–145)
TOTAL PROTEIN: 7.5 g/dL (ref 6.5–8.1)
Total Bilirubin: 0.9 mg/dL (ref 0.3–1.2)

## 2017-01-23 LAB — CBC
HEMATOCRIT: 26.3 % — AB (ref 35.0–47.0)
Hemoglobin: 8.9 g/dL — ABNORMAL LOW (ref 12.0–16.0)
MCH: 28 pg (ref 26.0–34.0)
MCHC: 33.8 g/dL (ref 32.0–36.0)
MCV: 82.8 fL (ref 80.0–100.0)
Platelets: 407 10*3/uL (ref 150–440)
RBC: 3.18 MIL/uL — AB (ref 3.80–5.20)
RDW: 14.7 % — ABNORMAL HIGH (ref 11.5–14.5)
WBC: 7.6 10*3/uL (ref 3.6–11.0)

## 2017-01-23 LAB — TROPONIN I

## 2017-01-23 LAB — MRSA PCR SCREENING: MRSA by PCR: NEGATIVE

## 2017-01-23 MED ORDER — ORAL CARE MOUTH RINSE
15.0000 mL | Freq: Two times a day (BID) | OROMUCOSAL | Status: DC
  Administered 2017-01-24 – 2017-01-31 (×11): 15 mL via OROMUCOSAL

## 2017-01-23 MED ORDER — INSULIN ASPART 100 UNIT/ML ~~LOC~~ SOLN
0.0000 [IU] | Freq: Three times a day (TID) | SUBCUTANEOUS | Status: DC
  Administered 2017-01-24: 4 [IU] via SUBCUTANEOUS
  Administered 2017-01-24: 7 [IU] via SUBCUTANEOUS
  Administered 2017-01-24: 3 [IU] via SUBCUTANEOUS
  Administered 2017-01-25 – 2017-01-26 (×4): 4 [IU] via SUBCUTANEOUS
  Administered 2017-01-26: 11 [IU] via SUBCUTANEOUS
  Administered 2017-01-27: 4 [IU] via SUBCUTANEOUS
  Administered 2017-01-27: 7 [IU] via SUBCUTANEOUS
  Administered 2017-01-27: 4 [IU] via SUBCUTANEOUS
  Administered 2017-01-28 (×2): 7 [IU] via SUBCUTANEOUS
  Administered 2017-01-28 – 2017-01-29 (×2): 4 [IU] via SUBCUTANEOUS
  Administered 2017-01-29: 3 [IU] via SUBCUTANEOUS
  Administered 2017-01-30 (×2): 7 [IU] via SUBCUTANEOUS
  Administered 2017-01-30 – 2017-02-02 (×8): 4 [IU] via SUBCUTANEOUS
  Administered 2017-02-02 (×2): 7 [IU] via SUBCUTANEOUS
  Administered 2017-02-04: 4 [IU] via SUBCUTANEOUS
  Filled 2017-01-23 (×7): qty 4
  Filled 2017-01-23: qty 7
  Filled 2017-01-23 (×2): qty 4
  Filled 2017-01-23: qty 7
  Filled 2017-01-23 (×2): qty 4
  Filled 2017-01-23: qty 7
  Filled 2017-01-23: qty 4
  Filled 2017-01-23: qty 7
  Filled 2017-01-23: qty 3
  Filled 2017-01-23 (×2): qty 4
  Filled 2017-01-23: qty 7
  Filled 2017-01-23 (×2): qty 4
  Filled 2017-01-23: qty 7
  Filled 2017-01-23: qty 4
  Filled 2017-01-23 (×2): qty 7
  Filled 2017-01-23: qty 11

## 2017-01-23 MED ORDER — ENOXAPARIN SODIUM 40 MG/0.4ML ~~LOC~~ SOLN
40.0000 mg | SUBCUTANEOUS | Status: DC
  Administered 2017-01-23: 40 mg via SUBCUTANEOUS
  Filled 2017-01-23: qty 0.4

## 2017-01-23 MED ORDER — OXYBUTYNIN CHLORIDE ER 5 MG PO TB24
10.0000 mg | ORAL_TABLET | Freq: Every day | ORAL | Status: DC
  Administered 2017-01-23 – 2017-01-31 (×9): 10 mg via ORAL
  Filled 2017-01-23 (×10): qty 2

## 2017-01-23 MED ORDER — POLYSACCHARIDE IRON COMPLEX 150 MG PO CAPS
150.0000 mg | ORAL_CAPSULE | Freq: Three times a day (TID) | ORAL | Status: DC
  Administered 2017-01-23 – 2017-02-03 (×30): 150 mg via ORAL
  Filled 2017-01-23 (×33): qty 1

## 2017-01-23 MED ORDER — SODIUM CHLORIDE 0.9% FLUSH
3.0000 mL | Freq: Two times a day (BID) | INTRAVENOUS | Status: DC
  Administered 2017-01-23 – 2017-02-03 (×22): 3 mL via INTRAVENOUS

## 2017-01-23 MED ORDER — BENZONATATE 100 MG PO CAPS: 100.0000 mg | ORAL_CAPSULE | Freq: Three times a day (TID) | ORAL | Status: DC | PRN

## 2017-01-23 MED ORDER — PIOGLITAZONE HCL 15 MG PO TABS
30.0000 mg | ORAL_TABLET | Freq: Every day | ORAL | Status: DC
  Administered 2017-01-24 – 2017-01-29 (×6): 30 mg via ORAL
  Filled 2017-01-23 (×6): qty 2

## 2017-01-23 MED ORDER — ACETAMINOPHEN 325 MG PO TABS
650.0000 mg | ORAL_TABLET | Freq: Four times a day (QID) | ORAL | Status: DC | PRN
  Administered 2017-01-24 – 2017-01-30 (×4): 650 mg via ORAL
  Filled 2017-01-23 (×5): qty 2

## 2017-01-23 MED ORDER — SENNA 8.6 MG PO TABS
1.0000 | ORAL_TABLET | Freq: Every day | ORAL | Status: DC
  Administered 2017-01-23 – 2017-02-02 (×10): 8.6 mg via ORAL
  Filled 2017-01-23 (×11): qty 1

## 2017-01-23 MED ORDER — ONDANSETRON HCL 4 MG/2ML IJ SOLN
4.0000 mg | Freq: Four times a day (QID) | INTRAMUSCULAR | Status: DC | PRN
  Administered 2017-01-23 – 2017-02-02 (×6): 4 mg via INTRAVENOUS
  Filled 2017-01-23 (×6): qty 2

## 2017-01-23 MED ORDER — INSULIN ASPART 100 UNIT/ML ~~LOC~~ SOLN
0.0000 [IU] | Freq: Every day | SUBCUTANEOUS | Status: DC
  Administered 2017-01-27 – 2017-01-31 (×2): 2 [IU] via SUBCUTANEOUS
  Filled 2017-01-23 (×2): qty 2
  Filled 2017-01-23: qty 3
  Filled 2017-01-23: qty 4

## 2017-01-23 MED ORDER — NYSTATIN 100000 UNIT/GM EX POWD
Freq: Three times a day (TID) | CUTANEOUS | Status: DC
  Administered 2017-01-23 – 2017-02-03 (×29): via TOPICAL
  Filled 2017-01-23: qty 15

## 2017-01-23 MED ORDER — POLYETHYLENE GLYCOL 3350 17 G PO PACK
17.0000 g | PACK | Freq: Every day | ORAL | Status: DC
  Administered 2017-01-23 – 2017-01-31 (×6): 17 g via ORAL
  Filled 2017-01-23 (×11): qty 1

## 2017-01-23 MED ORDER — METOPROLOL TARTRATE 50 MG PO TABS
50.0000 mg | ORAL_TABLET | Freq: Two times a day (BID) | ORAL | Status: DC
  Administered 2017-01-23 – 2017-02-03 (×13): 50 mg via ORAL
  Filled 2017-01-23 (×23): qty 1

## 2017-01-23 MED ORDER — DOCUSATE SODIUM 100 MG PO CAPS
100.0000 mg | ORAL_CAPSULE | Freq: Two times a day (BID) | ORAL | Status: DC
  Administered 2017-01-23 – 2017-02-03 (×19): 100 mg via ORAL
  Filled 2017-01-23 (×22): qty 1

## 2017-01-23 MED ORDER — HYDROXYZINE HCL 25 MG PO TABS
25.0000 mg | ORAL_TABLET | Freq: Every day | ORAL | Status: DC
  Administered 2017-01-23 – 2017-02-03 (×11): 25 mg via ORAL
  Filled 2017-01-23 (×13): qty 1

## 2017-01-23 MED ORDER — FUROSEMIDE 10 MG/ML IJ SOLN
40.0000 mg | Freq: Once | INTRAMUSCULAR | Status: AC
  Administered 2017-01-23: 40 mg via INTRAVENOUS
  Filled 2017-01-23: qty 4

## 2017-01-23 MED ORDER — ENSURE ENLIVE PO LIQD
237.0000 mL | Freq: Three times a day (TID) | ORAL | Status: DC
  Administered 2017-01-23 – 2017-01-31 (×18): 237 mL via ORAL

## 2017-01-23 MED ORDER — ESTROGENS, CONJUGATED 0.625 MG/GM VA CREA
1.0000 | TOPICAL_CREAM | VAGINAL | Status: DC
  Filled 2017-01-23: qty 30

## 2017-01-23 MED ORDER — LEVETIRACETAM 250 MG PO TABS
250.0000 mg | ORAL_TABLET | Freq: Two times a day (BID) | ORAL | Status: DC
  Administered 2017-01-23 – 2017-02-03 (×21): 250 mg via ORAL
  Filled 2017-01-23 (×27): qty 1

## 2017-01-23 MED ORDER — LORATADINE 10 MG PO TABS
10.0000 mg | ORAL_TABLET | Freq: Every day | ORAL | Status: DC
  Administered 2017-01-23 – 2017-02-02 (×10): 10 mg via ORAL
  Filled 2017-01-23 (×10): qty 1

## 2017-01-23 MED ORDER — TRAMADOL HCL 50 MG PO TABS: 50.0000 mg | ORAL_TABLET | Freq: Four times a day (QID) | ORAL | Status: DC | PRN

## 2017-01-23 MED ORDER — INSULIN GLARGINE 100 UNIT/ML ~~LOC~~ SOLN
43.0000 [IU] | Freq: Every day | SUBCUTANEOUS | Status: DC
  Administered 2017-01-23 – 2017-02-02 (×9): 43 [IU] via SUBCUTANEOUS
  Filled 2017-01-23 (×13): qty 0.43

## 2017-01-23 MED ORDER — ATORVASTATIN CALCIUM 20 MG PO TABS
20.0000 mg | ORAL_TABLET | Freq: Every day | ORAL | Status: DC
  Administered 2017-01-23 – 2017-02-02 (×10): 20 mg via ORAL
  Filled 2017-01-23 (×12): qty 1

## 2017-01-23 MED ORDER — OLANZAPINE 5 MG PO TBDP
10.0000 mg | ORAL_TABLET | Freq: Every day | ORAL | Status: DC
  Administered 2017-01-23 – 2017-02-03 (×11): 10 mg via ORAL
  Filled 2017-01-23 (×13): qty 2

## 2017-01-23 MED ORDER — METOCLOPRAMIDE HCL 5 MG PO TABS
5.0000 mg | ORAL_TABLET | Freq: Three times a day (TID) | ORAL | Status: DC | PRN
  Administered 2017-01-25 – 2017-01-28 (×2): 5 mg via ORAL
  Filled 2017-01-23 (×2): qty 1

## 2017-01-23 MED ORDER — PANTOPRAZOLE SODIUM 40 MG PO TBEC
40.0000 mg | DELAYED_RELEASE_TABLET | Freq: Every day | ORAL | Status: DC
  Administered 2017-01-23 – 2017-01-25 (×3): 40 mg via ORAL
  Filled 2017-01-23 (×3): qty 1

## 2017-01-23 MED ORDER — ONDANSETRON HCL 4 MG PO TABS
4.0000 mg | ORAL_TABLET | Freq: Four times a day (QID) | ORAL | Status: DC | PRN
  Administered 2017-01-24 – 2017-01-31 (×4): 4 mg via ORAL
  Filled 2017-01-23 (×5): qty 1

## 2017-01-23 MED ORDER — ACETAMINOPHEN 650 MG RE SUPP: 650.0000 mg | Freq: Four times a day (QID) | RECTAL | Status: DC | PRN

## 2017-01-23 MED ORDER — LAMOTRIGINE 25 MG PO TABS
25.0000 mg | ORAL_TABLET | Freq: Every day | ORAL | Status: DC
  Administered 2017-01-23 – 2017-02-02 (×10): 25 mg via ORAL
  Filled 2017-01-23 (×12): qty 1

## 2017-01-23 MED ORDER — FUROSEMIDE 10 MG/ML IJ SOLN
40.0000 mg | Freq: Two times a day (BID) | INTRAMUSCULAR | Status: DC
  Administered 2017-01-23 – 2017-01-27 (×8): 40 mg via INTRAVENOUS
  Filled 2017-01-23 (×8): qty 4

## 2017-01-23 MED ORDER — AMLODIPINE BESYLATE 5 MG PO TABS
2.5000 mg | ORAL_TABLET | Freq: Every day | ORAL | Status: DC
  Administered 2017-01-23 – 2017-01-31 (×8): 2.5 mg via ORAL
  Filled 2017-01-23 (×10): qty 1

## 2017-01-23 MED ORDER — ACETAMINOPHEN 325 MG PO TABS
650.0000 mg | ORAL_TABLET | Freq: Once | ORAL | Status: AC
  Administered 2017-01-23: 650 mg via ORAL
  Filled 2017-01-23: qty 2

## 2017-01-23 MED ORDER — VITAMIN E 45 MG (100 UNIT) PO CAPS
1000.0000 [IU] | ORAL_CAPSULE | Freq: Every day | ORAL | Status: DC
  Administered 2017-01-23 – 2017-02-02 (×10): 1000 [IU] via ORAL
  Filled 2017-01-23 (×14): qty 2

## 2017-01-23 NOTE — ED Notes (Signed)
Pt pullup changed, pt clean and dry at this time. Pt repositioned

## 2017-01-23 NOTE — H&P (Signed)
[facility] [place] NAME: Carolyn Valdez    MR#:  [id number]  DATE OF BIRTH:  12-04-1940  DATE OF ADMISSION:  01/23/2017  PRIMARY CARE PHYSICIAN: Leonel Ramsay, MD   REQUESTING/REFERRING PHYSICIAN: Dr. Lavonia Drafts  CHIEF COMPLAINT:   Chief Complaint  Patient presents with  . Shortness of Breath    HISTORY OF PRESENT ILLNESS:  Carolyn Valdez  is a 76 y.o. 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 Patient says she's been feeling short of breath now for the past 2-3 days progressively getting worse. At [facility] she was noted to be hypoxic with O2 sats in the 70s and sent to the ER for further evaluation. In the emergency room patient on 4 L of nasal cannula had O2 sats in the 90s. Her chest x-ray findings are suggestive of volume overload and mild CHF and hospitalist services were contacted further treatment and evaluation. Patient admits to some mild paroxysmal nocturnal dyspnea, but no orthopnea, no chest pain, nausea, vomiting or any other associated symptoms presently.  PAST MEDICAL HISTORY:   Past Medical History:  Diagnosis Date  . Anxiety   . Breast cancer ([place])    16 yrs ago  . CHF (congestive heart failure) ([place])   . Diabetes mellitus without complication ([place])   . DJD (degenerative joint disease)   . Dysuria   . H/O total knee replacement    right  . HTN (hypertension)   . Hypercholesteremia   . Kidney stone   . Obesity   . Recurrent urinary tract infection   . SVT (supraventricular tachycardia) ([place])   . Urinary incontinence   . Vaginal atrophy   . Yeast vaginitis      PAST SURGICAL HISTORY:   Past Surgical History:  Procedure Laterality Date  . CARDIAC CATHETERIZATION    . CHOLECYSTECTOMY    . FOOT SURGERY    . JOINT REPLACEMENT    . MASTECTOMY Right 1997  . NASAL SEPTUM SURGERY    . PERIPHERAL VASCULAR CATHETERIZATION N/A 07/08/2015   Procedure: PICC Line Insertion;  Surgeon: Algernon Huxley, MD;  Location: [facility];  Service: Cardiovascular;  Laterality: N/A;  . TONSILLECTOMY    . Vocal cords      SOCIAL HISTORY:   Social History  Substance Use Topics  . Smoking status: Former Research scientist (life sciences)  . Smokeless tobacco: Former Systems developer    Quit date: 10/29/1995  . Alcohol use No    FAMILY HISTORY:   Family History  Problem Relation Age of Onset  . Lung cancer Father   . Hematuria Mother   . Lung cancer Mother   . Kidney disease Neg Hx   . Bladder Cancer Neg Hx   . Breast cancer Neg Hx     DRUG ALLERGIES:   Allergies  Allergen Reactions  . Sulfa Antibiotics Hives  . Biaxin [Clarithromycin] Hives  . Influenza A (H1n1) Monoval Vac Other (See Comments)    Pt states that she was told by her MD not to get the influenza vaccine.    . Morphine Other (See Comments)    Reaction:  Dizziness and confusion   . Pyridium [Phenazopyridine Hcl]  Other (See Comments)    Reaction:  Unknown   . Ceftriaxone Anxiety  . Latex Rash  . Prednisone Rash  . Tape Rash    REVIEW OF SYSTEMS:   Review of Systems  Constitutional: Negative for fever and weight loss.  HENT: Negative for congestion, nosebleeds and tinnitus.   Eyes: Negative for blurred vision, double vision and redness.  Respiratory: Positive for shortness of breath. Negative for cough and hemoptysis.   Cardiovascular: Positive for leg swelling and PND. Negative for chest pain and orthopnea.  Gastrointestinal: Negative for abdominal pain, diarrhea, melena, nausea and vomiting.  Genitourinary: Negative for dysuria, hematuria and urgency.  Musculoskeletal: Negative for falls and joint pain.   Neurological: Negative for dizziness, tingling, sensory change, focal weakness, seizures, weakness and headaches.  Endo/Heme/Allergies: Negative for polydipsia. Does not bruise/bleed easily.  Psychiatric/Behavioral: Negative for depression and memory loss. The patient is not nervous/anxious.     MEDICATIONS AT HOME:   Prior to Admission medications   Medication Sig Start Date End Date Taking? Authorizing Provider  amLODipine (NORVASC) 10 MG tablet Take 1 tablet (10 mg total) by mouth daily. Patient taking differently: Take 2.5 mg by mouth daily.  10/26/16  Yes Epifanio Lesches, MD  atorvastatin (LIPITOR) 20 MG tablet Take 20 mg by mouth daily.    Yes Historical Provider, MD  benzonatate (TESSALON) 100 MG capsule Take 100 mg by mouth 3 (three) times daily as needed for cough.   Yes Historical Provider, MD  conjugated estrogens (PREMARIN) vaginal cream Place 1 Applicatorful vaginally 3 (three) times a week. Pt uses on Monday, Wednesday, and Friday.   Yes Historical Provider, MD  desmopressin (DDAVP) 0.2 MG tablet Take 1 tablet (0.2 mg total) by mouth at bedtime. 11/05/15  Yes Shannon A McGowan, PA-C  docusate sodium (COLACE) 100 MG capsule Take 100 mg by mouth 2 (two) times daily.    Yes Historical Provider, MD  feeding supplement, ENSURE ENLIVE, (ENSURE ENLIVE) LIQD Take 237 mLs by mouth 3 (three) times daily between meals. 10/26/16  Yes Epifanio Lesches, MD  fexofenadine (ALLEGRA) 180 MG tablet Take 180 mg by mouth daily.   Yes Historical Provider, MD  fluticasone (FLONASE) 50 MCG/ACT nasal spray Place 2 sprays into the nose as needed. 03/04/16  Yes Historical Provider, MD  furosemide (LASIX) 40 MG tablet Take 40 mg by mouth daily.   Yes Historical Provider, MD  hydrOXYzine (ATARAX/VISTARIL) 25 MG tablet Take 1 tablet by mouth at bedtime.  07/05/16  Yes Historical Provider, MD  insulin aspart (NOVOLOG) 100 UNIT/ML injection Inject 0-20 Units into the skin 3 (three) times daily with meals.  09/05/16  Yes Epifanio Lesches, MD  insulin aspart (NOVOLOG) 100 UNIT/ML injection Inject 0-5 Units into the skin at bedtime. 09/05/16  Yes Epifanio Lesches, MD  insulin glargine (LANTUS) 100 UNIT/ML injection Inject 0.72 mLs (72 Units total) into the skin at bedtime. Patient taking differently: Inject 43 Units into the skin at bedtime.  10/26/16  Yes Epifanio Lesches, MD  iron polysaccharides (NIFEREX) 150 MG capsule Take 150 mg by mouth 3 (three) times daily.    Yes Historical Provider, MD  lamoTRIgine (LAMICTAL) 25 MG tablet Take 25 mg by mouth daily.   Yes Historical Provider, MD  levETIRAcetam (KEPPRA) 250 MG tablet Take 1 tablet (250 mg total) by mouth 2 (two) times daily. 10/26/16  Yes Epifanio Lesches, MD  metoCLOPramide (REGLAN) 5 MG tablet Take 1 tablet (5 mg total) by mouth every 8 (eight) hours as needed for nausea  or vomiting. 09/29/16  Yes Theodoro Grist, MD  metoprolol (LOPRESSOR) 50 MG tablet Take 50 mg by mouth 2 (two) times daily.   Yes Historical Provider, MD  nystatin (MYCOSTATIN/NYSTOP) powder Apply topically 3 (three) times daily. 09/05/16  Yes Epifanio Lesches, MD  OLANZapine zydis (ZYPREXA) 5 MG disintegrating tablet Take 1 tablet (5 mg total) by mouth at bedtime. Patient taking differently: Take 10 mg by mouth at bedtime.  10/26/16  Yes Epifanio Lesches, MD  omeprazole (PRILOSEC) 40 MG capsule Take 40 mg by mouth 2 (two) times daily.   Yes Historical Provider, MD  ondansetron (ZOFRAN ODT) 4 MG disintegrating tablet Take 1 tablet (4 mg total) by mouth every 8 (eight) hours as needed for nausea or vomiting. 09/11/16  Yes Harvest Dark, MD  oxybutynin (DITROPAN-XL) 10 MG 24 hr tablet Take 10 mg by mouth daily.   Yes Historical Provider, MD  pioglitazone (ACTOS) 30 MG tablet Take 30 mg by mouth daily.   Yes Historical Provider, MD  polyethylene glycol (MIRALAX / GLYCOLAX) packet Take 17 g by mouth daily.   Yes Historical Provider, MD  senna (SENOKOT) 8.6 MG TABS  tablet Take 1 tablet (8.6 mg total) by mouth daily. 09/30/16  Yes Theodoro Grist, MD  traMADol (ULTRAM) 50 MG tablet Take 50 mg by mouth every 6 (six) hours as needed.   Yes Historical Provider, MD  vitamin E 1000 UNIT capsule Take 1,000 Units by mouth daily.   Yes Historical Provider, MD  gabapentin (NEURONTIN) 100 MG capsule Take 1 capsule (100 mg total) by mouth 3 (three) times daily as needed. Patient not taking: Reported on 01/23/2017 09/29/16   Theodoro Grist, MD  metoprolol succinate (TOPROL-XL) 50 MG 24 hr tablet Take 1 tablet (50 mg total) by mouth 2 (two) times daily. Take with or immediately following a meal. Patient not taking: Reported on 01/23/2017 09/29/16   Theodoro Grist, MD  VESICARE 10 MG tablet Take 1 tablet (10 mg total) by mouth daily. Patient not taking: Reported on 01/23/2017 11/05/15   Nori Riis, PA-C      VITAL SIGNS:  Blood pressure (!) 165/62, pulse 64, temperature 98.1 F (36.7 C), temperature source Oral, resp. rate 18, height 5\' 7"  (1.702 m), weight 127 kg (280 lb), SpO2 100 %.  PHYSICAL EXAMINATION:  Physical Exam  GENERAL:  76 y.o.-year-old obese pt. lying in bed in no acute distress.  EYES: Pupils equal, round, reactive to light and accommodation. No scleral icterus. Extraocular muscles intact.  HEENT: Head atraumatic, normocephalic. Oropharynx and nasopharynx clear. No oropharyngeal erythema, moist oral mucosa  NECK:  Supple, no jugular venous distention. No thyroid enlargement, no tenderness.  LUNGS: Normal breath sounds bilaterally, no wheezing, bibasilar rales, No rhonchi. No use of accessory muscles of respiration.  CARDIOVASCULAR: S1, S2 RRR. No murmurs, rubs, gallops, clicks.  ABDOMEN: Soft, nontender, nondistended. Bowel sounds present. No organomegaly or mass.  EXTREMITIES: +1-2 pedal edema b/l, No cyanosis, clubbing. + 2 pedal & radial pulses b/l.   NEUROLOGIC: Cranial nerves II through XII are intact. No focal Motor or sensory deficits appreciated  b/l. Globally weak.  PSYCHIATRIC: The patient is alert and oriented x 3.   SKIN: No obvious rash, lesion, or ulcer.   LABORATORY PANEL:   CBC  Recent Labs Lab 01/23/17 0904  WBC 7.6  HGB 8.9*  HCT 26.3*  PLT 407   ------------------------------------------------------------------------------------------------------------------  Chemistries   Recent Labs Lab 01/23/17 0904  NA 133*  K 5.0  CL 90*  CO2  37*  GLUCOSE 98  BUN 28*  CREATININE 1.08*  CALCIUM 9.8  AST 15  ALT 9*  ALKPHOS 74  BILITOT 0.9   ------------------------------------------------------------------------------------------------------------------  Cardiac Enzymes  Recent Labs Lab 01/23/17 0904  TROPONINI <0.03   ------------------------------------------------------------------------------------------------------------------  RADIOLOGY:  Dg Chest 1 View  Result Date: 01/23/2017 CLINICAL DATA:  SOB x 2 days Hx - CHF, HTN, SVT, diabetes, breast cancer - right side 1997, former smoker. EXAM: CHEST 1 VIEW COMPARISON:  Chest x-rays dated 10/21/2016, 09/11/2016 and 12/24/2015 FINDINGS: Stable cardiomegaly. Atherosclerotic changes again noted at the aortic arch. Central pulmonary vascular congestion and bilateral interstitial edema, likely superimposed on chronic bronchitic changes and chronic interstitial lung disease. Probable small left pleural effusion and mild bibasilar atelectasis. IMPRESSION: 1. Cardiomegaly with central pulmonary vascular congestion and bilateral interstitial edema indicating mild CHF/volume overload. Based on earlier exams, this appears to be superimposed on chronic bronchitic changes and some degree of chronic interstitial lung disease. 2. No overt alveolar pulmonary edema.  No evidence of pneumonia. 3. Probable small left pleural effusion and mild bibasilar atelectasis. Electronically Signed   By: Franki Cabot M.D.   On: 01/23/2017 09:22     IMPRESSION AND PLAN:   76 year old  female with past medical history of anxiety, depression, hypertension, diabetes, obstructive sleep apnea, chronic respiratory failure, chronic diastolic CHF, history of recurrent UTIs, who presents to the hospital due to shortness of breath and hypoxia.  1. Acute on chronic respiratory failure with hypoxia-secondary to volume overload and mild CHF. -Continue with supplementation, we'll diuresis with IV Lasix, follow clinically. Wean off oxygen as tolerated.  2. CHF-acute on chronic diastolic dysfunction. -We'll diuresis with IV Lasix, follow I's and O's and daily weights. -Continue metoprolol.  3. Diabetes type 2 without complication-continue Lantus, sliding scale insulin, Actos. Follow blood sugars.  4. History of seizures-continue Keppra, Lamictal.  5. History of depression with catatonia-continue Zyprexa.  6. History of recurrent UTIs-we'll check urinalysis and follow it up. If needed consider starting antibiotics. Currently afebrile with a normal white cell count.  7. GERD-continue Protonix.  8. Hyperlipidemia-continue atorvastatin.  9. Essential hypertension-continue Toprol, Norvasc.    All the records are reviewed and case discussed with ED provider. Management plans discussed with the patient, family and they are in agreement.  CODE STATUS: Full code  TOTAL TIME TAKING CARE OF THIS PATIENT: 45 minutes.    Henreitta Leber M.D on 01/23/2017 at 11:47 AM  Between 7am to 6pm - Pager - [phone]  After 6pm go to [email or web address] - password [email or web address] [facility]  [facility]  Office  [phone]  CC: Primary care physician; Leonel Ramsay, MD

## 2017-01-23 NOTE — ED Triage Notes (Signed)
Pt presents via EMS from [facility] c/o SOB. EMS reports 02 saturation in 70-80s on RA PTA. 02 saturation on 4L 02 99%.

## 2017-01-23 NOTE — ED Notes (Signed)
CBG 87, 0 units of insulin needed

## 2017-01-23 NOTE — ED Notes (Signed)
Snack given.

## 2017-01-23 NOTE — ED Provider Notes (Signed)
[facility] Emergency Department Provider Note   ____________________________________________    I have reviewed the triage vital signs and the nursing notes.   HISTORY  Chief Complaint Shortness of Breath     HPI Carolyn Valdez is a 76 y.o. female who presents from nursing home with shortness of breath. Patient reports over the last several days she has had worsening shortness of breath. She typically wears oxygen at night. She  does have a history of CHF and diabetes. She reports compliance with her medications. She denies chest pain. No fevers or chills. She does report a dry cough.   Past Medical History:  Diagnosis Date  . Anxiety   . Breast cancer ([place])    16 yrs ago  . CHF (congestive heart failure) ([place])   . Diabetes mellitus without complication ([place])   . DJD (degenerative joint disease)   . Dysuria   . H/O total knee replacement    right  . HTN (hypertension)   . Hypercholesteremia   . Kidney stone   . Obesity   . Recurrent urinary tract infection   . SVT (supraventricular tachycardia) ([place])   . Urinary incontinence   . Vaginal atrophy   . Yeast vaginitis     Patient Active Problem List   Diagnosis Date Noted  . Palliative care encounter   . Goals of care, counseling/discussion   . DNR (do not resuscitate) 10/05/2016  . Palliative care by specialist 10/05/2016  . 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 Severe major depression, single episode, with psychotic features ([place]) 10/04/2016  . Cellulitis of leg, right 09/29/2016  . Proctitis 09/29/2016  . Hypercapnia 09/29/2016  . Acute urinary retention 09/29/2016  . UTI (urinary tract infection) 09/29/2016  . Weakness 09/29/2016  . Subacute delirium 09/28/2016  . Gastrointestinal hemorrhage   . Diffuse abdominal pain   . Altered mental status 09/24/2016  . Pressure injury of skin 09/02/2016  . Respiratory failure with hypoxia ([place]) 09/01/2016    . Lymphedema 08/16/2016  . CHF (congestive heart failure) ([place]) 08/09/2016  . Acquired lymphedema of leg 04/21/2016  . Congestive heart failure ([place]) 11/25/2015  . Nocturia 11/05/2015  . Urinary frequency 11/05/2015  . Acute respiratory failure with hypoxia ([place]) 09/05/2015  . 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 Incontinence 04/20/2015  . Diabetes mellitus, type 2 ([place]) 04/17/2015  . ESBL (extended spectrum beta-lactamase) producing bacteria infection 04/17/2015  . BP (high blood pressure) 04/17/2015  . Frequent UTI 04/17/2015  . Absence of bladder continence 04/17/2015  . Iron deficiency anemia 03/08/2015  . Absolute anemia 09/25/2014  . Abdominal pain, lower 09/25/2014  . Urge incontinence 02/19/2013  . FOM (frequency of micturition) 02/19/2013  . Bladder infection, chronic 08/11/2012  . Difficult or painful urination 07/26/2012  . Lower urinary tract infection 12/31/2011  . Diabetes mellitus ([place]) 12/31/2011    Past Surgical History:  Procedure Laterality Date  . CARDIAC CATHETERIZATION    . CHOLECYSTECTOMY    . FOOT SURGERY    . JOINT REPLACEMENT    . MASTECTOMY Right 1997  . NASAL SEPTUM SURGERY    . PERIPHERAL VASCULAR CATHETERIZATION N/A 07/08/2015   Procedure: PICC Line Insertion;  Surgeon: Algernon Huxley, MD;  Location: [facility];  Service: Cardiovascular;  Laterality: N/A;  . TONSILLECTOMY    . Vocal cords      Prior to Admission medications   Medication Sig Start Date End Date Taking? Authorizing Provider  amLODipine (NORVASC)  10 MG tablet Take 1 tablet (10 mg total) by mouth daily. Patient taking differently: Take 2.5 mg by mouth daily.  10/26/16  Yes Epifanio Lesches, MD  atorvastatin (LIPITOR) 20 MG tablet Take 20 mg by mouth daily.    Yes Historical Provider, MD  benzonatate (TESSALON) 100 MG capsule Take 100 mg by mouth 3 (three) times daily as needed for cough.   Yes Historical Provider, MD  conjugated estrogens (PREMARIN) vaginal cream Place 1  Applicatorful vaginally 3 (three) times a week. Pt uses on Monday, Wednesday, and Friday.   Yes Historical Provider, MD  desmopressin (DDAVP) 0.2 MG tablet Take 1 tablet (0.2 mg total) by mouth at bedtime. 11/05/15  Yes Shannon A McGowan, PA-C  docusate sodium (COLACE) 100 MG capsule Take 100 mg by mouth 2 (two) times daily.    Yes Historical Provider, MD  feeding supplement, ENSURE ENLIVE, (ENSURE ENLIVE) LIQD Take 237 mLs by mouth 3 (three) times daily between meals. 10/26/16  Yes Epifanio Lesches, MD  fexofenadine (ALLEGRA) 180 MG tablet Take 180 mg by mouth daily.   Yes Historical Provider, MD  fluticasone (FLONASE) 50 MCG/ACT nasal spray Place 2 sprays into the nose as needed. 03/04/16  Yes Historical Provider, MD  furosemide (LASIX) 40 MG tablet Take 40 mg by mouth daily.   Yes Historical Provider, MD  hydrOXYzine (ATARAX/VISTARIL) 25 MG tablet Take 1 tablet by mouth at bedtime.  07/05/16  Yes Historical Provider, MD  insulin aspart (NOVOLOG) 100 UNIT/ML injection Inject 0-20 Units into the skin 3 (three) times daily with meals. 09/05/16  Yes Epifanio Lesches, MD  insulin aspart (NOVOLOG) 100 UNIT/ML injection Inject 0-5 Units into the skin at bedtime. 09/05/16  Yes Epifanio Lesches, MD  insulin glargine (LANTUS) 100 UNIT/ML injection Inject 0.72 mLs (72 Units total) into the skin at bedtime. Patient taking differently: Inject 43 Units into the skin at bedtime.  10/26/16  Yes Epifanio Lesches, MD  iron polysaccharides (NIFEREX) 150 MG capsule Take 150 mg by mouth 3 (three) times daily.    Yes Historical Provider, MD  lamoTRIgine (LAMICTAL) 25 MG tablet Take 25 mg by mouth daily.   Yes Historical Provider, MD  levETIRAcetam (KEPPRA) 250 MG tablet Take 1 tablet (250 mg total) by mouth 2 (two) times daily. 10/26/16  Yes Epifanio Lesches, MD  metoCLOPramide (REGLAN) 5 MG tablet Take 1 tablet (5 mg total) by mouth every 8 (eight) hours as needed for nausea or vomiting. 09/29/16  Yes Theodoro Grist, MD  metoprolol (LOPRESSOR) 50 MG tablet Take 50 mg by mouth 2 (two) times daily.   Yes Historical Provider, MD  nystatin (MYCOSTATIN/NYSTOP) powder Apply topically 3 (three) times daily. 09/05/16  Yes Epifanio Lesches, MD  OLANZapine zydis (ZYPREXA) 5 MG disintegrating tablet Take 1 tablet (5 mg total) by mouth at bedtime. Patient taking differently: Take 10 mg by mouth at bedtime.  10/26/16  Yes Epifanio Lesches, MD  omeprazole (PRILOSEC) 40 MG capsule Take 40 mg by mouth 2 (two) times daily.   Yes Historical Provider, MD  ondansetron (ZOFRAN ODT) 4 MG disintegrating tablet Take 1 tablet (4 mg total) by mouth every 8 (eight) hours as needed for nausea or vomiting. 09/11/16  Yes Harvest Dark, MD  oxybutynin (DITROPAN-XL) 10 MG 24 hr tablet Take 10 mg by mouth daily.   Yes Historical Provider, MD  pioglitazone (ACTOS) 30 MG tablet Take 30 mg by mouth daily.   Yes Historical Provider, MD  polyethylene glycol (MIRALAX / GLYCOLAX) packet Take 17 g  by mouth daily.   Yes Historical Provider, MD  senna (SENOKOT) 8.6 MG TABS tablet Take 1 tablet (8.6 mg total) by mouth daily. 09/30/16  Yes Theodoro Grist, MD  traMADol (ULTRAM) 50 MG tablet Take 50 mg by mouth every 6 (six) hours as needed.   Yes Historical Provider, MD  vitamin E 1000 UNIT capsule Take 1,000 Units by mouth daily.   Yes Historical Provider, MD  gabapentin (NEURONTIN) 100 MG capsule Take 1 capsule (100 mg total) by mouth 3 (three) times daily as needed. Patient not taking: Reported on 01/23/2017 09/29/16   Theodoro Grist, MD  metoprolol succinate (TOPROL-XL) 50 MG 24 hr tablet Take 1 tablet (50 mg total) by mouth 2 (two) times daily. Take with or immediately following a meal. Patient not taking: Reported on 01/23/2017 09/29/16   Theodoro Grist, MD  VESICARE 10 MG tablet Take 1 tablet (10 mg total) by mouth daily. Patient not taking: Reported on 01/23/2017 11/05/15   Nori Riis, PA-C     Allergies Sulfa antibiotics; Biaxin  [clarithromycin]; Influenza a (h1n1) monoval vac; Morphine; Pyridium [phenazopyridine hcl]; Ceftriaxone; Latex; Prednisone; and Tape  Family History  Problem Relation Age of Onset  . Lung cancer Father   . Hematuria Mother   . Lung cancer Mother   . Kidney disease Neg Hx   . Bladder Cancer Neg Hx   . Breast cancer Neg Hx     Social History Social History  Substance Use Topics  . Smoking status: Former Research scientist (life sciences)  . Smokeless tobacco: Former Systems developer    Quit date: 10/29/1995  . Alcohol use No    Review of Systems  Constitutional: No fever/chills Eyes: No visual changes.   Cardiovascular: Denies chest pain. Respiratory:As above Gastrointestinal: No abdominal pain.  No nausea, no vomiting.    Musculoskeletal: Negative for back pain. Skin: Negative for rash. Neurological: Negative for headaches   10-point ROS otherwise negative.  ____________________________________________   PHYSICAL EXAM:  VITAL SIGNS: ED Triage Vitals  Enc Vitals Group     BP 01/23/17 0902 (!) 156/55     Pulse Rate 01/23/17 0902 71     Resp 01/23/17 0902 20     Temp 01/23/17 0902 98.1 F (36.7 C)     Temp Source 01/23/17 0902 Oral     SpO2 01/23/17 0902 99 %     Weight 01/23/17 0903 280 lb (127 kg)     Height 01/23/17 0903 5\' 7"  (1.702 m)     Head Circumference --      Peak Flow --      Pain Score --      Pain Loc --      Pain Edu? --      Excl. in [place]? --     Constitutional: Alert and oriented. No acute distress.  Eyes: Conjunctivae are normal.   Nose: No congestion/rhinnorhea. Mouth/Throat: Mucous membranes are moist.    Cardiovascular: Normal rate, regular rhythm. Grossly normal heart sounds.  Good peripheral circulation. Respiratory: Increased respiratory effort, bibasilar rales. Gastrointestinal: Soft and nontender. No distention.  No CVA tenderness. Genitourinary: deferred Musculoskeletal: Positive edema bilaterally. Warm and well perfused Neurologic:  Normal speech and language. No  gross focal neurologic deficits are appreciated.  Skin:  Skin is warm, dry and intact. No rash noted. Psychiatric: Mood and affect are normal. Speech and behavior are normal.  ____________________________________________   LABS (all labs ordered are listed, but only abnormal results are displayed)  Labs Reviewed  CBC - Abnormal; Notable for  the following:       Result Value   RBC 3.18 (*)    Hemoglobin 8.9 (*)    HCT 26.3 (*)    RDW 14.7 (*)    All other components within normal limits  COMPREHENSIVE METABOLIC PANEL - Abnormal; Notable for the following:    Sodium 133 (*)    Chloride 90 (*)    CO2 37 (*)    BUN 28 (*)    Creatinine, Ser 1.08 (*)    Albumin 3.1 (*)    ALT 9 (*)    GFR calc non Af Amer 49 (*)    GFR calc Af Amer 57 (*)    All other components within normal limits  TROPONIN I   ____________________________________________  EKG  ED ECG REPORT I, Lavonia Drafts, the attending physician, personally viewed and interpreted this ECG.  Date: 01/23/2017   Rhythm: normal sinus rhythm QRS Axis: normal Intervals: normal ST/T Wave abnormalities: normal Conduction Disturbances: none Narrative Interpretation: unremarkable  ____________________________________________  RADIOLOGY  X-ray demonstrates pulmonary edema ____________________________________________   PROCEDURES  Procedure(s) performed: No    Critical Care performed:No ____________________________________________   INITIAL IMPRESSION / ASSESSMENT AND PLAN / ED COURSE  Pertinent labs & imaging results that were available during my care of the patient were reviewed by me and considered in my medical decision making (see chart for details).  Patient presents with increased oxygen requirement/shortness of breath. She has significant edema suspect CHF exacerbation, labs chest x-ray pending. no chest pain or fevers.    ----------------------------------------- 9:54 AM on  01/23/2017 -----------------------------------------  Chest x-ray demonstrates pulmonary edemaWe will give IV Lasix and she will require admission for further management.       ____________________________________________   FINAL CLINICAL IMPRESSION(S) / ED DIAGNOSES  Final diagnoses:  Acute on chronic congestive heart failure, unspecified congestive heart failure type ([place])      NEW MEDICATIONS STARTED DURING THIS VISIT:  New Prescriptions   No medications on file     Note:  This document was prepared using Dragon voice recognition software and may include unintentional dictation errors.    Lavonia Drafts, MD 01/23/17 1110

## 2017-01-24 ENCOUNTER — Inpatient Hospital Stay: Payer: Medicare Other

## 2017-01-24 ENCOUNTER — Encounter: Payer: Self-pay | Admitting: Radiology

## 2017-01-24 LAB — BASIC METABOLIC PANEL
Anion gap: 6 (ref 5–15)
BUN: 25 mg/dL — ABNORMAL HIGH (ref 6–20)
CHLORIDE: 86 mmol/L — AB (ref 101–111)
CO2: 37 mmol/L — ABNORMAL HIGH (ref 22–32)
CREATININE: 0.88 mg/dL (ref 0.44–1.00)
Calcium: 9.6 mg/dL (ref 8.9–10.3)
GFR calc Af Amer: >60 mL/min (ref >60)
GFR calc non Af Amer: >60 mL/min (ref >60)
Glucose, Bld: 169 mg/dL — ABNORMAL HIGH (ref 65–99)
Potassium: 4.8 mmol/L (ref 3.5–5.1)
Sodium: 129 mmol/L — ABNORMAL LOW (ref 135–145)

## 2017-01-24 LAB — CBC
HEMATOCRIT: 25.3 % — AB (ref 35.0–47.0)
Hemoglobin: 8.3 g/dL — ABNORMAL LOW (ref 12.0–16.0)
MCH: 27.5 pg (ref 26.0–34.0)
MCHC: 32.9 g/dL (ref 32.0–36.0)
MCV: 83.4 fL (ref 80.0–100.0)
Platelets: 345 10*3/uL (ref 150–440)
RBC: 3.03 MIL/uL — ABNORMAL LOW (ref 3.80–5.20)
RDW: 14.5 % (ref 11.5–14.5)
WBC: 6.9 10*3/uL (ref 3.6–11.0)

## 2017-01-24 LAB — GLUCOSE, CAPILLARY
GLUCOSE-CAPILLARY: 123 mg/dL — AB (ref 65–99)
Glucose-Capillary: 168 mg/dL — ABNORMAL HIGH (ref 65–99)
Glucose-Capillary: 207 mg/dL — ABNORMAL HIGH (ref 65–99)
Glucose-Capillary: 199 mg/dL — ABNORMAL HIGH (ref 65–99)
Glucose-Capillary: 159 mg/dL — ABNORMAL HIGH (ref 65–99)

## 2017-01-24 MED ORDER — ENOXAPARIN SODIUM 40 MG/0.4ML ~~LOC~~ SOLN
40.0000 mg | Freq: Two times a day (BID) | SUBCUTANEOUS | Status: DC
  Administered 2017-01-24 – 2017-02-03 (×20): 40 mg via SUBCUTANEOUS
  Filled 2017-01-24 (×20): qty 0.4

## 2017-01-24 MED ORDER — GUAIFENESIN ER 600 MG PO TB12
600.0000 mg | ORAL_TABLET | Freq: Two times a day (BID) | ORAL | Status: DC
  Administered 2017-01-24 – 2017-02-03 (×19): 600 mg via ORAL
  Filled 2017-01-24 (×21): qty 1

## 2017-01-24 MED ORDER — IOPAMIDOL (ISOVUE-370) INJECTION 76%
75.0000 mL | Freq: Once | INTRAVENOUS | Status: AC | PRN
Start: 2017-01-24 — End: 2017-01-24
  Administered 2017-01-24: 75 mL via INTRAVENOUS

## 2017-01-24 NOTE — Progress Notes (Signed)
Enoxaparin dose modified to 40 mg subcutaneously twice daily due to BMI greater than 40.  Lekha Dancer A. [place], [place].D., BCPS Clinical Pharmacist 01/24/2017 13:44

## 2017-01-24 NOTE — Plan of Care (Signed)
Problem: Pain Managment: Goal: General experience of comfort will improve Outcome: Progressing Patient has not complained of pain.

## 2017-01-24 NOTE — Clinical Social Work Note (Signed)
Patient is a resident of [facility] and CSW has spoken to [facility] at [facility] and they will take patient back at discharge. Carolyn Valdez stated that patient chooses not to walk and chooses to be noncompliant with her diet. CSW will complete full assessment. Shela Leff MSW,LCSW [phone]

## 2017-01-24 NOTE — Progress Notes (Signed)
Carolyn Valdez at [facility]                                                                                                                                                                                  Patient Demographics   Carolyn Valdez, is a 76 y.o. female, DOB - May 13, 1941, DTO:[id number]  Admit date - 01/23/2017   Admitting Physician Henreitta Leber, MD  Outpatient Primary MD for the patient is [name], [name], MD   LOS - 1  Subjective: Patient continues to complain of shortness of breath denies any chest pains or palpitations no fever    Review of Systems:   CONSTITUTIONAL: No documented fever. No fatigue, weakness. No weight gain, no weight loss.  EYES: No blurry or double vision.  ENT: No tinnitus. No postnasal drip. No redness of the oropharynx.  RESPIRATORY: No cough, no wheeze, no hemoptysis. Positive dyspnea.  CARDIOVASCULAR: No chest pain. No orthopnea. No palpitations. No syncope.  GASTROINTESTINAL: No nausea, no vomiting or diarrhea. No abdominal pain. No melena or hematochezia.  GENITOURINARY: No dysuria or hematuria.  ENDOCRINE: No polyuria or nocturia. No heat or cold intolerance.  HEMATOLOGY: No anemia. No bruising. No bleeding.  INTEGUMENTARY: No rashes. No lesions.  MUSCULOSKELETAL: No arthritis. No swelling. No gout.  NEUROLOGIC: No numbness, tingling, or ataxia. No seizure-type activity.  PSYCHIATRIC: No anxiety. No insomnia. No ADD.    Vitals:   Vitals:   01/23/17 2130 01/24/17 0618 01/24/17 1036 01/24/17 1240  BP: (!) 154/46 (!) 149/53 (!) 129/33 (!) 161/57  Pulse: 71 66  80  Resp: 20   20  Temp: 97.5 F (36.4 C) 97.7 F (36.5 C)  97.4 F (36.3 C)  TempSrc: Oral Oral  Oral  SpO2: 99% 100%    Weight:      Height:        Wt Readings from Last 3 Encounters:  01/23/17 280 lb (127 kg)  10/20/16 254 lb (115.2 kg)  09/11/16 290 lb (131.5 kg)     Intake/Output Summary (Last 24 hours) at 01/24/17 1356 Last data filed  at 01/24/17 0900  Gross per 24 hour  Intake              840 ml  Output                0 ml  Net              840 ml    Physical Exam:   GENERAL: Pleasant-appearing in no apparent distress.  HEAD, EYES, EARS, NOSE AND THROAT: Atraumatic, normocephalic. Extraocular muscles are intact. Pupils equal and reactive to light. Sclerae anicteric. No conjunctival injection.  No oro-pharyngeal erythema.  NECK: Supple. There is no jugular venous distention. No bruits, no lymphadenopathy, no thyromegaly.  HEART: Regular rate and rhythm,. No murmurs, no rubs, no clicks.  LUNGS: Clear to auscultation bilaterally. No rales or rhonchi. No wheezes.  ABDOMEN: Soft, flat, nontender, nondistended. Has good bowel sounds. No hepatosplenomegaly appreciated.  EXTREMITIES: No evidence of any cyanosis, clubbing, 2+ peripheral edema.  +2 pedal and radial pulses bilaterally.  NEUROLOGIC: The patient is alert, awake, and oriented x3 with no focal motor or sensory deficits appreciated bilaterally.  SKIN: Moist and warm with no rashes appreciated.  Psych: Not anxious, depressed LN: No inguinal LN enlargement    Antibiotics   Anti-infectives    None      Medications   Scheduled Meds: . amLODipine  2.5 mg Oral Daily  . atorvastatin  20 mg Oral Daily  . docusate sodium  100 mg Oral BID  . enoxaparin (LOVENOX) injection  40 mg Subcutaneous Q12H  . feeding supplement (ENSURE ENLIVE)  237 mL Oral TID BM  . furosemide  40 mg Intravenous Q12H  . hydrOXYzine  25 mg Oral QHS  . insulin aspart  0-20 Units Subcutaneous TID WC  . insulin aspart  0-5 Units Subcutaneous QHS  . insulin glargine  43 Units Subcutaneous QHS  . iron polysaccharides  150 mg Oral TID  . lamoTRIgine  25 mg Oral Daily  . levETIRAcetam  250 mg Oral BID  . loratadine  10 mg Oral Daily  . mouth rinse  15 mL Mouth Rinse BID  . metoprolol  50 mg Oral BID  . nystatin   Topical TID  . OLANZapine zydis  10 mg Oral QHS  . oxybutynin  10 mg Oral  Daily  . pantoprazole  40 mg Oral Daily  . pioglitazone  30 mg Oral Daily  . polyethylene glycol  17 g Oral Daily  . senna  1 tablet Oral Daily  . sodium chloride flush  3 mL Intravenous Q12H  . vitamin E  1,000 Units Oral Daily   Continuous Infusions: PRN Meds:.acetaminophen **OR** acetaminophen, benzonatate, metoCLOPramide, ondansetron **OR** ondansetron (ZOFRAN) IV, traMADol   Data Review:   Micro Results Recent Results (from the past 240 hour(s))  MRSA PCR Screening     Status: None   Collection Time: 01/23/17  8:56 AM  Result Value Ref Range Status   MRSA by PCR NEGATIVE NEGATIVE Final    Comment:        The GeneXpert MRSA Assay (FDA approved for NASAL specimens only), is one component of a comprehensive MRSA colonization surveillance program. It is not intended to diagnose MRSA infection nor to guide or monitor treatment for MRSA infections.     Radiology Reports Dg Chest 1 View  Result Date: 01/23/2017 CLINICAL DATA:  SOB x 2 days Hx - CHF, HTN, SVT, diabetes, breast cancer - right side 1997, former smoker. EXAM: CHEST 1 VIEW COMPARISON:  Chest x-rays dated 10/21/2016, 09/11/2016 and 12/24/2015 FINDINGS: Stable cardiomegaly. Atherosclerotic changes again noted at the aortic arch. Central pulmonary vascular congestion and bilateral interstitial edema, likely superimposed on chronic bronchitic changes and chronic interstitial lung disease. Probable small left pleural effusion and mild bibasilar atelectasis. IMPRESSION: 1. Cardiomegaly with central pulmonary vascular congestion and bilateral interstitial edema indicating mild CHF/volume overload. Based on earlier exams, this appears to be superimposed on chronic bronchitic changes and some degree of chronic interstitial lung disease. 2. No overt alveolar pulmonary edema.  No evidence of pneumonia. 3. Probable small left  pleural effusion and mild bibasilar atelectasis. Electronically Signed   By: Franki Cabot M.D.   On:  01/23/2017 09:22   Ct Angio Chest Pe W Or Wo Contrast  Result Date: 01/24/2017 CLINICAL DATA:  Shortness of Breath EXAM: CT ANGIOGRAPHY CHEST WITH CONTRAST TECHNIQUE: Multidetector CT imaging of the chest was performed using the standard protocol during bolus administration of intravenous contrast. Multiplanar CT image reconstructions and MIPs were obtained to evaluate the vascular anatomy. CONTRAST:  75 cc Isovue COMPARISON:  None. FINDINGS: Cardiovascular: Cardiomegaly is noted. No pericardial effusion. Mitral valve calcifications are noted. Atherosclerotic calcifications of coronary arteries. The the no pulmonary embolus is noted. Atherosclerotic calcifications of thoracic aorta. No aortic aneurysm or aortic dissection. Mediastinum/Nodes: Sub- carinal lymph node measures 1.6 by 3 cm. AP window lymph node Measures 1.3 cm short-axis. Right hilar lymph node measures 1.6 cm short-axis. Left hilar lymph node Measures 1 cm short-axis. There is debris noted within esophagus. Mild thickening of distal esophageal wall. Findings highly suspicious for gastroesophageal reflux. The the right pretracheal lymph node measures 1.4 cm short-axis. There is a low-density nodule right lobe of thyroid gland measures 1.1 cm. Lungs/Pleura: There is bilateral small pleural effusion. Bilateral lower lobe posterior atelectasis or infiltrate right greater than left. Mild interstitial prominence bilateral upper lobes. There is patchy ground-glass attenuation bilateral upper lobes. Findings highly suspicious for mild pulmonary edema. Less likely upper lobe pneumonia. Upper Abdomen: Visualized upper abdomen shows no adrenal gland mass. There is mild splenomegaly. The spleen measures 14.2 cm in length. Postcholecystectomy surgical clips are noted. Visualized pancreas and liver is unremarkable. Musculoskeletal: No destructive bony lesions are noted. Sagittal images of the spine shows mild degenerative changes mid and lower thoracic spine.  Review of the MIP images confirms the above findings. IMPRESSION: 1. No pulmonary embolus is noted. 2. There is mediastinal and hilar adenopathy. Although might be reactive lymphoproliferative disease or metastatic disease cannot be entirely excluded. Follow-up examination in 3 months is recommended to assure stability or resolution. 3. There is debris noted throughout the esophagus. Mild thickening of distal esophageal wall. Gastroesophageal reflux or reflux esophagitis cannot be excluded. Less likely achalasia. 4. There is bilateral small pleural effusion. Bilateral lower lobe atelectasis or infiltrate right greater than left. Mild interstitial prominence and patchy ground-glass attenuation bilaterally suspicious for mild pulmonary edema. Less likely diffuse bilateral pneumonia. 5. Mild degenerative changes thoracic spine. 6. Mild splenomegaly.  The spleen measures 14.2 cm in length. Electronically Signed   By: Lahoma Crocker M.D.   On: 01/24/2017 13:20     CBC  Recent Labs Lab 01/23/17 0904 01/24/17 0405  WBC 7.6 6.9  HGB 8.9* 8.3*  HCT 26.3* 25.3*  PLT 407 345  MCV 82.8 83.4  MCH 28.0 27.5  MCHC 33.8 32.9  RDW 14.7* 14.5    Chemistries   Recent Labs Lab 01/23/17 0904 01/24/17 0319  NA 133* 129*  K 5.0 4.8  CL 90* 86*  CO2 37* 37*  GLUCOSE 98 169*  BUN 28* 25*  CREATININE 1.08* 0.88  CALCIUM 9.8 9.6  AST 15  --   ALT 9*  --   ALKPHOS 74  --   BILITOT 0.9  --    ------------------------------------------------------------------------------------------------------------------ estimated creatinine clearance is 77.7 mL/min (by C-G formula based on SCr of 0.88 mg/dL). ------------------------------------------------------------------------------------------------------------------ No results for input(s): HGBA1C in the last 72 hours. ------------------------------------------------------------------------------------------------------------------ No results for input(s): CHOL,  HDL, LDLCALC, TRIG, CHOLHDL, LDLDIRECT in the last 72 hours. ------------------------------------------------------------------------------------------------------------------ No results for input(s):  TSH, T4TOTAL, T3FREE, THYROIDAB in the last 72 hours.  Invalid input(s): FREET3 ------------------------------------------------------------------------------------------------------------------ No results for input(s): VITAMINB12, FOLATE, FERRITIN, TIBC, IRON, RETICCTPCT in the last 72 hours.  Coagulation profile No results for input(s): INR, PROTIME in the last 168 hours.  No results for input(s): DDIMER in the last 72 hours.  Cardiac Enzymes  Recent Labs Lab 01/23/17 0904  TROPONINI <0.03   ------------------------------------------------------------------------------------------------------------------ Invalid input(s): POCBNP    Assessment & Plan   A81 year old female with past medical history of anxiety, depression, hypertension, diabetes, obstructive sleep apnea, chronic respiratory failure, chronic diastolic CHF, history of recurrent UTIs, who presents to the hospital due to shortness of breath and hypoxia.  1. Acute on chronic respiratory failure with hypoxia-secondary to volume overload and mild CHF. -No significant urine output I will obtain a CT of the chest to rule out pulmonary embolism due to patient's sedentary lifestyle  2. CHF-acute on chronic diastolic dysfunction. -Continue IV Lasix -Continue metoprolol.  3. Diabetes type 2 without complication-continue Lantus, sliding scale insulin, Actos. Follow blood sugars.  4. History of seizures-continue Keppra, Lamictal.  5. History of depression with catatonia-continue Zyprexa.  6. History of recurrent UTIs-we'll check urinalysis and follow it up. If needed consider starting antibiotics. Currently afebrile with a normal white cell count.  7. GERD-continue Protonix.  8. Hyperlipidemia-continue  atorvastatin.  9. Essential hypertension-continue Toprol, Norvasc.       Code Status Orders        Start     Ordered   01/23/17 1320  Full code  Continuous     01/23/17 1320    Code Status History    Date Active Date Inactive Code Status Order ID Comments User Context   10/05/2016 12:16 PM 10/26/2016  5:40 PM DNR [id number]  Knox Royalty, NP Inpatient   09/24/2016  5:31 PM 10/05/2016 12:16 PM Full Code [id number]  Gladstone Lighter, MD Inpatient   09/01/2016  2:17 AM 09/05/2016  4:28 PM Full Code [id number]  Harvie Bridge, DO Inpatient   08/09/2016  8:27 PM 08/12/2016  8:15 PM Full Code [id number]  Henreitta Leber, MD Inpatient   09/30/2015  2:23 PM 10/07/2015  5:37 PM Full Code [id number]  Loletha Grayer, MD ED   09/05/2015  5:56 PM 09/09/2015  5:38 PM DNR [id number]  Loletha Grayer, MD ED    Advance Directive Documentation     Most Recent Value  Type of Advance Directive  Healthcare Power of [place], Living will  Pre-existing out of facility DNR order (yellow form or pink MOST form)  -  "MOST" Form in Place?  -           Consults  none   DVT Prophylaxis  Lovenox   Lab Results  Component Value Date   PLT 345 01/24/2017     Time Spent in minutes   76min  Greater than 50% of time spent in care coordination and counseling patient regarding the condition and plan of care.   Dustin Flock M.D on 01/24/2017 at 1:56 PM  Between 7am to 6pm - Pager - [phone]  After 6pm go to [email or web address] - password [email or web address] [facility]   Office  [phone]

## 2017-01-24 NOTE — Progress Notes (Signed)
PT Cancellation Note  Patient Details Name: [name] MRN: [id number] DOB: 06-Oct-1941   Cancelled Treatment:    Reason Eval/Treat Not Completed: Patient declined, no reason specified. Pt declined working with PT at this time. Pt stated this is not a good time because she is eating lunch and that it was taking her a long time to eat d/t SOB. PT will attempt evaluation at a later date/time as able.    Amiley Shishido, SPT 01/24/2017, 1:43 PM

## 2017-01-25 LAB — GLUCOSE, CAPILLARY
GLUCOSE-CAPILLARY: 181 mg/dL — AB (ref 65–99)
Glucose-Capillary: 181 mg/dL — ABNORMAL HIGH (ref 65–99)
Glucose-Capillary: 97 mg/dL (ref 65–99)
Glucose-Capillary: 164 mg/dL — ABNORMAL HIGH (ref 65–99)

## 2017-01-25 MED ORDER — CIPROFLOXACIN HCL 500 MG PO TABS
500.0000 mg | ORAL_TABLET | Freq: Two times a day (BID) | ORAL | Status: DC
  Administered 2017-01-25 – 2017-01-26 (×2): 500 mg via ORAL
  Filled 2017-01-25 (×2): qty 1

## 2017-01-25 MED ORDER — LEVOFLOXACIN IN D5W 500 MG/100ML IV SOLN
500.0000 mg | INTRAVENOUS | Status: DC
  Administered 2017-01-25 – 2017-01-26 (×2): 500 mg via INTRAVENOUS
  Filled 2017-01-25 (×3): qty 100

## 2017-01-25 MED ORDER — PANTOPRAZOLE SODIUM 40 MG PO TBEC
40.0000 mg | DELAYED_RELEASE_TABLET | Freq: Two times a day (BID) | ORAL | Status: DC
  Administered 2017-01-25 – 2017-02-04 (×16): 40 mg via ORAL
  Filled 2017-01-25 (×17): qty 1

## 2017-01-25 NOTE — Progress Notes (Signed)
PT Cancellation Note  Patient Details Name: Carolyn Valdez MRN: [id number] DOB: [birth year]   Cancelled Treatment:    Reason Eval/Treat Not Completed: Patient declined, no reason specified. Pt was shaking her head no upon entry and stated that she just got done taking her medications and needed to rest. She said she was too tired to work with PT right now. PT will reattempt eval at a later date/time as able.   Justan Gaede, SPT 01/25/2017, 11:44 AM

## 2017-01-25 NOTE — Progress Notes (Signed)
[place] at [facility]                                                                                                                                                                                  Patient Demographics   Carolyn Valdez, is a 76 y.o. female, DOB - [birth year], BPZ:[id number]  Admit date - 01/23/2017   Admitting Physician Henreitta Leber, MD  Outpatient Primary MD for the patient is [name], DAVID Mamie Nick, MD   LOS - 2  Subjective: Patient continues to complain of shortness of breath. Denies chills. No palpitation   Review of Systems:   CONSTITUTIONAL: No documented fever. No fatigue, weakness. No weight gain, no weight loss.  EYES: No blurry or double vision.  ENT: No tinnitus. No postnasal drip. No redness of the oropharynx.  RESPIRATORY: No cough, no wheeze, no hemoptysis. Positive dyspnea.  CARDIOVASCULAR: No chest pain. No orthopnea. No palpitations. No syncope.  GASTROINTESTINAL: No nausea, no vomiting or diarrhea. No abdominal pain. No melena or hematochezia.  GENITOURINARY: No dysuria or hematuria.  ENDOCRINE: No polyuria or nocturia. No heat or cold intolerance.  HEMATOLOGY: No anemia. No bruising. No bleeding.  INTEGUMENTARY: No rashes. No lesions.  MUSCULOSKELETAL: No arthritis. No swelling. No gout.  NEUROLOGIC: No numbness, tingling, or ataxia. No seizure-type activity.  PSYCHIATRIC: No anxiety. No insomnia. No ADD.    Vitals:   Vitals:   01/25/17 0008 01/25/17 0626 01/25/17 1111 01/25/17 1254  BP:  (!) 145/50 (!) 135/51 (!) 136/55  Pulse: 87 69 60 62  Resp:  (!) 22  (!) 24  Temp:  98.2 F (36.8 C)  97.5 F (36.4 C)  TempSrc:  Oral  Oral  SpO2: 94% 93%  100%  Weight:      Height:        Wt Readings from Last 3 Encounters:  01/23/17 280 lb (127 kg)  10/20/16 254 lb (115.2 kg)  09/11/16 290 lb (131.5 kg)     Intake/Output Summary (Last 24 hours) at 01/25/17 1343 Last data filed at 01/25/17 1225  Gross per 24 hour   Intake              480 ml  Output                0 ml  Net              480 ml    Physical Exam:   GENERAL: Pleasant-appearing in no apparent distress.  HEAD, EYES, EARS, NOSE AND THROAT: Atraumatic, normocephalic. Extraocular muscles are intact. Pupils equal and reactive to light. Sclerae anicteric. No conjunctival injection. No oro-pharyngeal erythema.  NECK: Supple. There  is no jugular venous distention. No bruits, no lymphadenopathy, no thyromegaly.  HEART: Regular rate and rhythm,. No murmurs, no rubs, no clicks.  LUNGS: Clear to auscultation bilaterally. No rales or rhonchi. No wheezes.  ABDOMEN: Soft, flat, nontender, nondistended. Has good bowel sounds. No hepatosplenomegaly appreciated.  EXTREMITIES: No evidence of any cyanosis, clubbing, 2+ peripheral edema.  +2 pedal and radial pulses bilaterally.  NEUROLOGIC: The patient is alert, awake, and oriented x3 with no focal motor or sensory deficits appreciated bilaterally.  SKIN: Moist and warm with no rashes appreciated.  Psych: Not anxious, depressed LN: No inguinal LN enlargement    Antibiotics   Anti-infectives    None      Medications   Scheduled Meds: . amLODipine  2.5 mg Oral Daily  . atorvastatin  20 mg Oral Daily  . docusate sodium  100 mg Oral BID  . enoxaparin (LOVENOX) injection  40 mg Subcutaneous Q12H  . feeding supplement (ENSURE ENLIVE)  237 mL Oral TID BM  . furosemide  40 mg Intravenous Q12H  . guaiFENesin  600 mg Oral BID  . hydrOXYzine  25 mg Oral QHS  . insulin aspart  0-20 Units Subcutaneous TID WC  . insulin aspart  0-5 Units Subcutaneous QHS  . insulin glargine  43 Units Subcutaneous QHS  . iron polysaccharides  150 mg Oral TID  . lamoTRIgine  25 mg Oral Daily  . levETIRAcetam  250 mg Oral BID  . loratadine  10 mg Oral Daily  . mouth rinse  15 mL Mouth Rinse BID  . metoprolol  50 mg Oral BID  . nystatin   Topical TID  . OLANZapine zydis  10 mg Oral QHS  . oxybutynin  10 mg Oral Daily   . pantoprazole  40 mg Oral BID AC  . pioglitazone  30 mg Oral Daily  . polyethylene glycol  17 g Oral Daily  . senna  1 tablet Oral Daily  . sodium chloride flush  3 mL Intravenous Q12H  . vitamin E  1,000 Units Oral Daily   Continuous Infusions: PRN Meds:.acetaminophen **OR** acetaminophen, benzonatate, metoCLOPramide, ondansetron **OR** ondansetron (ZOFRAN) IV, traMADol   Data Review:   Micro Results Recent Results (from the past 240 hour(s))  MRSA PCR Screening     Status: None   Collection Time: 01/23/17  8:56 AM  Result Value Ref Range Status   MRSA by PCR NEGATIVE NEGATIVE Final    Comment:        The GeneXpert MRSA Assay (FDA approved for NASAL specimens only), is one component of a comprehensive MRSA colonization surveillance program. It is not intended to diagnose MRSA infection nor to guide or monitor treatment for MRSA infections.     Radiology Reports Dg Chest 1 View  Result Date: 01/23/2017 CLINICAL DATA:  SOB x 2 days Hx - CHF, HTN, SVT, diabetes, breast cancer - right side 1997, former smoker. EXAM: CHEST 1 VIEW COMPARISON:  Chest x-rays dated 10/21/2016, 09/11/2016 and 12/24/2015 FINDINGS: Stable cardiomegaly. Atherosclerotic changes again noted at the aortic arch. Central pulmonary vascular congestion and bilateral interstitial edema, likely superimposed on chronic bronchitic changes and chronic interstitial lung disease. Probable small left pleural effusion and mild bibasilar atelectasis. IMPRESSION: 1. Cardiomegaly with central pulmonary vascular congestion and bilateral interstitial edema indicating mild CHF/volume overload. Based on earlier exams, this appears to be superimposed on chronic bronchitic changes and some degree of chronic interstitial lung disease. 2. No overt alveolar pulmonary edema.  No evidence of pneumonia. 3. Probable  small left pleural effusion and mild bibasilar atelectasis. Electronically Signed   By: Franki Cabot M.D.   On: 01/23/2017  09:22   Ct Angio Chest Pe W Or Wo Contrast  Result Date: 01/24/2017 CLINICAL DATA:  Shortness of Breath EXAM: CT ANGIOGRAPHY CHEST WITH CONTRAST TECHNIQUE: Multidetector CT imaging of the chest was performed using the standard protocol during bolus administration of intravenous contrast. Multiplanar CT image reconstructions and MIPs were obtained to evaluate the vascular anatomy. CONTRAST:  75 cc Isovue COMPARISON:  None. FINDINGS: Cardiovascular: Cardiomegaly is noted. No pericardial effusion. Mitral valve calcifications are noted. Atherosclerotic calcifications of coronary arteries. The the no pulmonary embolus is noted. Atherosclerotic calcifications of thoracic aorta. No aortic aneurysm or aortic dissection. Mediastinum/Nodes: Sub- carinal lymph node measures 1.6 by 3 cm. AP window lymph node Measures 1.3 cm short-axis. Right hilar lymph node measures 1.6 cm short-axis. Left hilar lymph node Measures 1 cm short-axis. There is debris noted within esophagus. Mild thickening of distal esophageal wall. Findings highly suspicious for gastroesophageal reflux. The the right pretracheal lymph node measures 1.4 cm short-axis. There is a low-density nodule right lobe of thyroid gland measures 1.1 cm. Lungs/Pleura: There is bilateral small pleural effusion. Bilateral lower lobe posterior atelectasis or infiltrate right greater than left. Mild interstitial prominence bilateral upper lobes. There is patchy ground-glass attenuation bilateral upper lobes. Findings highly suspicious for mild pulmonary edema. Less likely upper lobe pneumonia. Upper Abdomen: Visualized upper abdomen shows no adrenal gland mass. There is mild splenomegaly. The spleen measures 14.2 cm in length. Postcholecystectomy surgical clips are noted. Visualized pancreas and liver is unremarkable. Musculoskeletal: No destructive bony lesions are noted. Sagittal images of the spine shows mild degenerative changes mid and lower thoracic spine. Review of the  MIP images confirms the above findings. IMPRESSION: 1. No pulmonary embolus is noted. 2. There is mediastinal and hilar adenopathy. Although might be reactive lymphoproliferative disease or metastatic disease cannot be entirely excluded. Follow-up examination in 3 months is recommended to assure stability or resolution. 3. There is debris noted throughout the esophagus. Mild thickening of distal esophageal wall. Gastroesophageal reflux or reflux esophagitis cannot be excluded. Less likely achalasia. 4. There is bilateral small pleural effusion. Bilateral lower lobe atelectasis or infiltrate right greater than left. Mild interstitial prominence and patchy ground-glass attenuation bilaterally suspicious for mild pulmonary edema. Less likely diffuse bilateral pneumonia. 5. Mild degenerative changes thoracic spine. 6. Mild splenomegaly.  The spleen measures 14.2 cm in length. Electronically Signed   By: Lahoma Crocker M.D.   On: 01/24/2017 13:20     CBC  Recent Labs Lab 01/23/17 0904 01/24/17 0405  WBC 7.6 6.9  HGB 8.9* 8.3*  HCT 26.3* 25.3*  PLT 407 345  MCV 82.8 83.4  MCH 28.0 27.5  MCHC 33.8 32.9  RDW 14.7* 14.5    Chemistries   Recent Labs Lab 01/23/17 0904 01/24/17 0319  NA 133* 129*  K 5.0 4.8  CL 90* 86*  CO2 37* 37*  GLUCOSE 98 169*  BUN 28* 25*  CREATININE 1.08* 0.88  CALCIUM 9.8 9.6  AST 15  --   ALT 9*  --   ALKPHOS 74  --   BILITOT 0.9  --    ------------------------------------------------------------------------------------------------------------------ estimated creatinine clearance is 77.7 mL/min (by C-G formula based on SCr of 0.88 mg/dL). ------------------------------------------------------------------------------------------------------------------ No results for input(s): HGBA1C in the last 72 hours. ------------------------------------------------------------------------------------------------------------------ No results for input(s): CHOL, HDL, LDLCALC,  TRIG, CHOLHDL, LDLDIRECT in the last 72 hours. ------------------------------------------------------------------------------------------------------------------ No results  for input(s): TSH, T4TOTAL, T3FREE, THYROIDAB in the last 72 hours.  Invalid input(s): FREET3 ------------------------------------------------------------------------------------------------------------------ No results for input(s): VITAMINB12, FOLATE, FERRITIN, TIBC, IRON, RETICCTPCT in the last 72 hours.  Coagulation profile No results for input(s): INR, PROTIME in the last 168 hours.  No results for input(s): DDIMER in the last 72 hours.  Cardiac Enzymes  Recent Labs Lab 01/23/17 0904  TROPONINI <0.03   ------------------------------------------------------------------------------------------------------------------ Invalid input(s): POCBNP    Assessment & Plan   A79 year old female with past medical history of anxiety, depression, hypertension, diabetes, obstructive sleep apnea, chronic respiratory failure, chronic diastolic CHF, history of recurrent UTIs, who presents to the hospital due to shortness of breath and hypoxia.  1. Acute on chronic respiratory failure with hypoxia-secondary to volume overload and mild CHF. CT scan reviewed shows atelectasis as well as mild fluid overload Continue IV Lasix I encouraged patient to use incentive spirometry but not sure if she is using that   2. CHF-acute on chronic diastolic dysfunction. -Continue IV Lasix -Continue metoprolol.  3. Diabetes type 2 without complication-continue Lantus, sliding scale insulin, Actos. Follow blood sugars.  4. History of seizures-continue Keppra, Lamictal.  5. History of depression with catatonia-continue Zyprexa.  6. History of recurrent UTIs-. Positive UA will treat with antibiotics  7. GERD-continue Protonix.  8. Hyperlipidemia-continue atorvastatin.  9. Essential hypertension-continue Toprol,  Norvasc.       Code Status Orders        Start     Ordered   01/23/17 1320  Full code  Continuous     01/23/17 1320    Code Status History    Date Active Date Inactive Code Status Order ID Comments User Context   10/05/2016 12:16 PM 10/26/2016  5:40 PM DNR [id number]  Knox Royalty, NP Inpatient   09/24/2016  5:31 PM 10/05/2016 12:16 PM Full Code [id number]  Gladstone Lighter, MD Inpatient   09/01/2016  2:17 AM 09/05/2016  4:28 PM Full Code [id number]  Harvie Bridge, DO Inpatient   08/09/2016  8:27 PM 08/12/2016  8:15 PM Full Code [id number]  Henreitta Leber, MD Inpatient   09/30/2015  2:23 PM 10/07/2015  5:37 PM Full Code [id number]  Loletha Grayer, MD ED   09/05/2015  5:56 PM 09/09/2015  5:38 PM DNR [id number]  Loletha Grayer, MD ED    Advance Directive Documentation     Most Recent Value  Type of Advance Directive  Healthcare Power of [place], Living will  Pre-existing out of facility DNR order (yellow form or pink MOST form)  -  "MOST" Form in Place?  -           Consults  none   DVT Prophylaxis  Lovenox   Lab Results  Component Value Date   PLT 345 01/24/2017     Time Spent in minutes   43min  Greater than 50% of time spent in care coordination and counseling patient regarding the condition and plan of care.   Dustin Flock M.D on 01/25/2017 at 1:43 PM  Between 7am to 6pm - Pager - [phone]  After 6pm go to [email or web address] - password [email or web address] [place] [facility]   Office  [phone]

## 2017-01-26 LAB — BASIC METABOLIC PANEL
Anion gap: 5 (ref 5–15)
BUN: 26 mg/dL — ABNORMAL HIGH (ref 6–20)
CO2: 43 mmol/L — ABNORMAL HIGH (ref 22–32)
Calcium: 9.6 mg/dL (ref 8.9–10.3)
Chloride: 82 mmol/L — ABNORMAL LOW (ref 101–111)
Creatinine, Ser: 1.11 mg/dL — ABNORMAL HIGH (ref 0.44–1.00)
GFR calc Af Amer: 55 mL/min — ABNORMAL LOW (ref >60)
GFR calc non Af Amer: 48 mL/min — ABNORMAL LOW (ref >60)
Glucose, Bld: 172 mg/dL — ABNORMAL HIGH (ref 65–99)
Potassium: 5.1 mmol/L (ref 3.5–5.1)
Sodium: 130 mmol/L — ABNORMAL LOW (ref 135–145)

## 2017-01-26 LAB — CBC
HCT: 25.9 % — ABNORMAL LOW (ref 35.0–47.0)
Hemoglobin: 8.6 g/dL — ABNORMAL LOW (ref 12.0–16.0)
MCH: 27.4 pg (ref 26.0–34.0)
MCHC: 33.3 g/dL (ref 32.0–36.0)
MCV: 82.1 fL (ref 80.0–100.0)
Platelets: 344 10*3/uL (ref 150–440)
RBC: 3.15 MIL/uL — ABNORMAL LOW (ref 3.80–5.20)
RDW: 14.9 % — ABNORMAL HIGH (ref 11.5–14.5)
WBC: 8.4 10*3/uL (ref 3.6–11.0)

## 2017-01-26 LAB — GLUCOSE, CAPILLARY
Glucose-Capillary: 120 mg/dL — ABNORMAL HIGH (ref 65–99)
Glucose-Capillary: 170 mg/dL — ABNORMAL HIGH (ref 65–99)
Glucose-Capillary: 255 mg/dL — ABNORMAL HIGH (ref 65–99)
Glucose-Capillary: 190 mg/dL — ABNORMAL HIGH (ref 65–99)
Glucose-Capillary: 165 mg/dL — ABNORMAL HIGH (ref 65–99)

## 2017-01-26 MED ORDER — CLONAZEPAM 0.125 MG PO TBDP
0.2500 mg | ORAL_TABLET | Freq: Two times a day (BID) | ORAL | Status: DC
  Administered 2017-01-26 (×2): 0.25 mg via ORAL
  Filled 2017-01-26 (×3): qty 2

## 2017-01-26 MED ORDER — FLUTICASONE PROPIONATE 50 MCG/ACT NA SUSP
2.0000 | Freq: Every day | NASAL | Status: DC | PRN
  Filled 2017-01-26: qty 16

## 2017-01-26 MED ORDER — DESMOPRESSIN ACETATE 0.2 MG PO TABS
0.2000 mg | ORAL_TABLET | Freq: Every day | ORAL | Status: DC
  Administered 2017-01-26 – 2017-01-31 (×6): 0.2 mg via ORAL
  Filled 2017-01-26 (×6): qty 1

## 2017-01-26 MED ORDER — LORAZEPAM 1 MG PO TABS
1.0000 mg | ORAL_TABLET | Freq: Four times a day (QID) | ORAL | Status: DC | PRN
  Administered 2017-01-26 – 2017-01-29 (×8): 1 mg via ORAL
  Filled 2017-01-26 (×8): qty 1

## 2017-01-26 MED ORDER — DARIFENACIN HYDROBROMIDE ER 7.5 MG PO TB24: 7.5000 mg | ORAL_TABLET | Freq: Every day | ORAL | Status: DC

## 2017-01-26 NOTE — Progress Notes (Signed)
[place] at [facility]                                                                                                                                                                                  Patient Demographics   Carolyn Valdez, is a 76 y.o. female, DOB - 09-25-1941, HGD:[id number]  Admit date - 01/23/2017   Admitting Physician Henreitta Leber, MD  Outpatient Primary MD for the patient is [name], [name], MD   LOS - 3  Subjective: Still continues to complain of some shortness of breath   Review of Systems:   CONSTITUTIONAL: No documented fever. No fatigue, weakness. No weight gain, no weight loss.  EYES: No blurry or double vision.  ENT: No tinnitus. No postnasal drip. No redness of the oropharynx.  RESPIRATORY: No cough, no wheeze, no hemoptysis. Positive dyspnea.  CARDIOVASCULAR: No chest pain. No orthopnea. No palpitations. No syncope.  GASTROINTESTINAL: No nausea, no vomiting or diarrhea. No abdominal pain. No melena or hematochezia.  GENITOURINARY: No dysuria or hematuria.  ENDOCRINE: No polyuria or nocturia. No heat or cold intolerance.  HEMATOLOGY: No anemia. No bruising. No bleeding.  INTEGUMENTARY: No rashes. No lesions.  MUSCULOSKELETAL: No arthritis. No swelling. No gout.  NEUROLOGIC: No numbness, tingling, or ataxia. No seizure-type activity.  PSYCHIATRIC: No anxiety. No insomnia. No ADD.    Vitals:   Vitals:   01/25/17 2228 01/26/17 0234 01/26/17 0533 01/26/17 0927  BP: (!) 143/45 (!) 140/46 (!) 154/52 (!) 148/53  Pulse: 66 65 73 71  Resp: 18 (!) 24 18 20   Temp: 97.3 F (36.3 C)  97.4 F (36.3 C) 97.6 F (36.4 C)  TempSrc: Oral  Oral Oral  SpO2: 99% 100% 100% 100%  Weight:      Height:        Wt Readings from Last 3 Encounters:  01/23/17 280 lb (127 kg)  10/20/16 254 lb (115.2 kg)  09/11/16 290 lb (131.5 kg)     Intake/Output Summary (Last 24 hours) at 01/26/17 1245 Last data filed at 01/26/17 1048  Gross per  24 hour  Intake              840 ml  Output                0 ml  Net              840 ml    Physical Exam:   GENERAL: Pleasant-appearing in no apparent distress.  HEAD, EYES, EARS, NOSE AND THROAT: Atraumatic, normocephalic. Extraocular muscles are intact. Pupils equal and reactive to light. Sclerae anicteric. No conjunctival injection. No oro-pharyngeal erythema.  NECK: Supple. There  is no jugular venous distention. No bruits, no lymphadenopathy, no thyromegaly.  HEART: Regular rate and rhythm,. No murmurs, no rubs, no clicks.  LUNGS: Clear to auscultation bilaterally. No rales or rhonchi. No wheezes.  ABDOMEN: Soft, flat, nontender, nondistended. Has good bowel sounds. No hepatosplenomegaly appreciated.  EXTREMITIES: No evidence of any cyanosis, clubbing, 2+ peripheral edema.  +2 pedal and radial pulses bilaterally.  NEUROLOGIC: The patient is alert, awake, and oriented x3 with no focal motor or sensory deficits appreciated bilaterally.  SKIN: Moist and warm with no rashes appreciated.  Psych: Not anxious, depressed LN: No inguinal LN enlargement    Antibiotics   Anti-infectives    Start     Dose/Rate Route Frequency Ordered Stop   01/25/17 2000  ciprofloxacin (CIPRO) tablet 500 mg     500 mg Oral 2 times daily 01/25/17 1347     01/25/17 1600  levofloxacin (LEVAQUIN) IVPB 500 mg     500 mg 100 mL/hr over 60 Minutes Intravenous Every 24 hours 01/25/17 1448        Medications   Scheduled Meds: . amLODipine  2.5 mg Oral Daily  . atorvastatin  20 mg Oral Daily  . ciprofloxacin  500 mg Oral BID  . docusate sodium  100 mg Oral BID  . enoxaparin (LOVENOX) injection  40 mg Subcutaneous Q12H  . feeding supplement (ENSURE ENLIVE)  237 mL Oral TID BM  . furosemide  40 mg Intravenous Q12H  . guaiFENesin  600 mg Oral BID  . hydrOXYzine  25 mg Oral QHS  . insulin aspart  0-20 Units Subcutaneous TID WC  . insulin aspart  0-5 Units Subcutaneous QHS  . insulin glargine  43 Units  Subcutaneous QHS  . iron polysaccharides  150 mg Oral TID  . lamoTRIgine  25 mg Oral Daily  . levETIRAcetam  250 mg Oral BID  . levofloxacin (LEVAQUIN) IV  500 mg Intravenous Q24H  . loratadine  10 mg Oral Daily  . mouth rinse  15 mL Mouth Rinse BID  . metoprolol  50 mg Oral BID  . nystatin   Topical TID  . OLANZapine zydis  10 mg Oral QHS  . oxybutynin  10 mg Oral Daily  . pantoprazole  40 mg Oral BID AC  . pioglitazone  30 mg Oral Daily  . polyethylene glycol  17 g Oral Daily  . senna  1 tablet Oral Daily  . sodium chloride flush  3 mL Intravenous Q12H  . vitamin E  1,000 Units Oral Daily   Continuous Infusions: PRN Meds:.acetaminophen **OR** acetaminophen, benzonatate, LORazepam, metoCLOPramide, ondansetron **OR** ondansetron (ZOFRAN) IV, traMADol   Data Review:   Micro Results Recent Results (from the past 240 hour(s))  MRSA PCR Screening     Status: None   Collection Time: 01/23/17  8:56 AM  Result Value Ref Range Status   MRSA by PCR NEGATIVE NEGATIVE Final    Comment:        The GeneXpert MRSA Assay (FDA approved for NASAL specimens only), is one component of a comprehensive MRSA colonization surveillance program. It is not intended to diagnose MRSA infection nor to guide or monitor treatment for MRSA infections.     Radiology Reports Dg Chest 1 View  Result Date: 01/23/2017 CLINICAL DATA:  SOB x 2 days Hx - CHF, HTN, SVT, diabetes, breast cancer - right side 1997, former smoker. EXAM: CHEST 1 VIEW COMPARISON:  Chest x-rays dated 10/21/2016, 09/11/2016 and 12/24/2015 FINDINGS: Stable cardiomegaly. Atherosclerotic changes again noted at  the aortic arch. Central pulmonary vascular congestion and bilateral interstitial edema, likely superimposed on chronic bronchitic changes and chronic interstitial lung disease. Probable small left pleural effusion and mild bibasilar atelectasis. IMPRESSION: 1. Cardiomegaly with central pulmonary vascular congestion and bilateral  interstitial edema indicating mild CHF/volume overload. Based on earlier exams, this appears to be superimposed on chronic bronchitic changes and some degree of chronic interstitial lung disease. 2. No overt alveolar pulmonary edema.  No evidence of pneumonia. 3. Probable small left pleural effusion and mild bibasilar atelectasis. Electronically Signed   By: Franki Cabot M.D.   On: 01/23/2017 09:22   Ct Angio Chest Pe W Or Wo Contrast  Result Date: 01/24/2017 CLINICAL DATA:  Shortness of Breath EXAM: CT ANGIOGRAPHY CHEST WITH CONTRAST TECHNIQUE: Multidetector CT imaging of the chest was performed using the standard protocol during bolus administration of intravenous contrast. Multiplanar CT image reconstructions and MIPs were obtained to evaluate the vascular anatomy. CONTRAST:  75 cc Isovue COMPARISON:  None. FINDINGS: Cardiovascular: Cardiomegaly is noted. No pericardial effusion. Mitral valve calcifications are noted. Atherosclerotic calcifications of coronary arteries. The the no pulmonary embolus is noted. Atherosclerotic calcifications of thoracic aorta. No aortic aneurysm or aortic dissection. Mediastinum/Nodes: Sub- carinal lymph node measures 1.6 by 3 cm. AP window lymph node Measures 1.3 cm short-axis. Right hilar lymph node measures 1.6 cm short-axis. Left hilar lymph node Measures 1 cm short-axis. There is debris noted within esophagus. Mild thickening of distal esophageal wall. Findings highly suspicious for gastroesophageal reflux. The the right pretracheal lymph node measures 1.4 cm short-axis. There is a low-density nodule right lobe of thyroid gland measures 1.1 cm. Lungs/Pleura: There is bilateral small pleural effusion. Bilateral lower lobe posterior atelectasis or infiltrate right greater than left. Mild interstitial prominence bilateral upper lobes. There is patchy ground-glass attenuation bilateral upper lobes. Findings highly suspicious for mild pulmonary edema. Less likely upper lobe  pneumonia. Upper Abdomen: Visualized upper abdomen shows no adrenal gland mass. There is mild splenomegaly. The spleen measures 14.2 cm in length. Postcholecystectomy surgical clips are noted. Visualized pancreas and liver is unremarkable. Musculoskeletal: No destructive bony lesions are noted. Sagittal images of the spine shows mild degenerative changes mid and lower thoracic spine. Review of the MIP images confirms the above findings. IMPRESSION: 1. No pulmonary embolus is noted. 2. There is mediastinal and hilar adenopathy. Although might be reactive lymphoproliferative disease or metastatic disease cannot be entirely excluded. Follow-up examination in 3 months is recommended to assure stability or resolution. 3. There is debris noted throughout the esophagus. Mild thickening of distal esophageal wall. Gastroesophageal reflux or reflux esophagitis cannot be excluded. Less likely achalasia. 4. There is bilateral small pleural effusion. Bilateral lower lobe atelectasis or infiltrate right greater than left. Mild interstitial prominence and patchy ground-glass attenuation bilaterally suspicious for mild pulmonary edema. Less likely diffuse bilateral pneumonia. 5. Mild degenerative changes thoracic spine. 6. Mild splenomegaly.  The spleen measures 14.2 cm in length. Electronically Signed   By: Lahoma Crocker M.D.   On: 01/24/2017 13:20     CBC  Recent Labs Lab 01/23/17 0904 01/24/17 0405 01/26/17 0800  WBC 7.6 6.9 8.4  HGB 8.9* 8.3* 8.6*  HCT 26.3* 25.3* 25.9*  PLT 407 345 344  MCV 82.8 83.4 82.1  MCH 28.0 27.5 27.4  MCHC 33.8 32.9 33.3  RDW 14.7* 14.5 14.9*    Chemistries   Recent Labs Lab 01/23/17 0904 01/24/17 0319 01/26/17 0800  NA 133* 129* 130*  K 5.0 4.8 5.1  CL  90* 86* 82*  CO2 37* 37* 43*  GLUCOSE 98 169* 172*  BUN 28* 25* 26*  CREATININE 1.08* 0.88 1.11*  CALCIUM 9.8 9.6 9.6  AST 15  --   --   ALT 9*  --   --   ALKPHOS 74  --   --   BILITOT 0.9  --   --     ------------------------------------------------------------------------------------------------------------------ estimated creatinine clearance is 61.6 mL/min (A) (by C-G formula based on SCr of 1.11 mg/dL (H)). ------------------------------------------------------------------------------------------------------------------ No results for input(s): HGBA1C in the last 72 hours. ------------------------------------------------------------------------------------------------------------------ No results for input(s): CHOL, HDL, LDLCALC, TRIG, CHOLHDL, LDLDIRECT in the last 72 hours. ------------------------------------------------------------------------------------------------------------------ No results for input(s): TSH, T4TOTAL, T3FREE, THYROIDAB in the last 72 hours.  Invalid input(s): FREET3 ------------------------------------------------------------------------------------------------------------------ No results for input(s): VITAMINB12, FOLATE, FERRITIN, TIBC, IRON, RETICCTPCT in the last 72 hours.  Coagulation profile No results for input(s): INR, PROTIME in the last 168 hours.  No results for input(s): DDIMER in the last 72 hours.  Cardiac Enzymes  Recent Labs Lab 01/23/17 0904  TROPONINI <0.03   ------------------------------------------------------------------------------------------------------------------ Invalid input(s): POCBNP    Assessment & Plan   A70 year old female with past medical history of anxiety, depression, hypertension, diabetes, obstructive sleep apnea, chronic respiratory failure, chronic diastolic CHF, history of recurrent UTIs, who presents to the hospital due to shortness of breath and hypoxia.  1. Acute on chronic respiratory failure with hypoxia-secondary to volume overload and mild CHF. CT scan reviewed shows atelectasis as well as mild fluid overload Possible pneumonia Continue IV Lasix started on IV antibiotics yesterday I continue  incentive spirometry   2. CHF-acute on chronic diastolic dysfunction. -Continue IV Lasix -Continue metoprolol.  3. Diabetes type 2 without complication-continue Lantus, sliding scale insulin, Actos. Follow blood sugars.  4. History of seizures-continue Keppra, Lamictal.  5. History of depression with catatonia-continue Zyprexa.  6. History of recurrent UTIs-. Positive UA will treat with antibiotics  7. GERD-continue Protonix.  8. Hyperlipidemia-continue atorvastatin.  9. Essential hypertension-continue Toprol, Norvasc.       Code Status Orders        Start     Ordered   01/23/17 1320  Full code  Continuous     01/23/17 1320    Code Status History    Date Active Date Inactive Code Status Order ID Comments User Context   10/05/2016 12:16 PM 10/26/2016  5:40 PM DNR [id number]  Knox Royalty, NP Inpatient   09/24/2016  5:31 PM 10/05/2016 12:16 PM Full Code [id number]  Gladstone Lighter, MD Inpatient   09/01/2016  2:17 AM 09/05/2016  4:28 PM Full Code [id number]  Harvie Bridge, DO Inpatient   08/09/2016  8:27 PM 08/12/2016  8:15 PM Full Code [id number]  Henreitta Leber, MD Inpatient   09/30/2015  2:23 PM 10/07/2015  5:37 PM Full Code [id number]  Loletha Grayer, MD ED   09/05/2015  5:56 PM 09/09/2015  5:38 PM DNR [id number]  Loletha Grayer, MD ED    Advance Directive Documentation     Most Recent Value  Type of Advance Directive  Healthcare Power of [place], Living will  Pre-existing out of facility DNR order (yellow form or pink MOST form)  -  "MOST" Form in Place?  -           Consults  none   DVT Prophylaxis  Lovenox   Lab Results  Component Value Date   PLT 344 01/26/2017     Time Spent in minutes   25min  Greater than 50%  of time spent in care coordination and counseling patient regarding the condition and plan of care.   Dustin Flock M.D on 01/26/2017 at 12:45 PM  Between 7am to 6pm - Pager - [phone]  After 6pm go to  [email or web address] - password [email or web address] [place] [facility]   Office  [phone]

## 2017-01-26 NOTE — Clinical Social Work Note (Signed)
Clinical Social Work Assessment  Patient Details  Name: Carolyn Valdez MRN: [id number] Date of Birth: 02-Mar-1941  Date of referral:  01/26/17               Reason for consult:  Facility Placement                Permission sought to share information with:  Facility Art therapist granted to share information::  Yes, Verbal Permission Granted  Name::        Agency::     Relationship::     Contact Information:     Housing/Transportation Living arrangements for the past 2 months:  [place] of Information:  Patient, Facility Patient Interpreter Needed:  None Criminal Activity/Legal Involvement Pertinent to Current Situation/Hospitalization:  No - Comment as needed Significant Relationships:  Siblings Lives with:  Facility Resident Do you feel safe going back to the place where you live?  Yes Need for family participation in patient care:  No (Coment)  Care giving concerns:  Patient resides at [facility].   Social Worker assessment / plan:  Patient is known to CSW from a previous admission in which patient was placed at [facility]. Patient has since been transferred to an ALF. CSW spoke with Horris Latino at [facility] and she is able to take patient back even though patient does not choose to walk and chooses to be noncompliant with diet. CSW spoke with patient this afternoon and she confirmed that she is at [facility] and stated that she really doesn't know how she feels about living at the facility. She mentioned that she is having to pay for it out of pocket and she is not happy about this. About this time, patient mentioned she was short of breath so CSW went to get charge nurse. CSW will continue to follow.  Employment status:  Retired Nurse, adult PT Recommendations:    Information / Referral to community resources:     Patient/Family's Response to care:  Patient's demeanor was slightly  irritable.  Patient/Family's Understanding of and Emotional Response to Diagnosis, Current Treatment, and Prognosis:  Patient does not appear happy at having to spend her financial means on her own care.   Emotional Assessment Appearance:  Appears stated age Attitude/Demeanor/Rapport:   (mildly irritated) Affect (typically observed):  Calm, Constricted, Agitated Orientation:  Oriented to Self, Oriented to Place, Oriented to Situation Alcohol / Substance use:  Not Applicable Psych involvement (Current and /or in the community):  No (Comment)  Discharge Needs  Concerns to be addressed:  Care Coordination Readmission within the last 30 days:  No Current discharge risk:  None Barriers to Discharge:  No Barriers Identified   Shela Leff, LCSW 01/26/2017, 2:18 PM

## 2017-01-26 NOTE — Evaluation (Signed)
Physical Therapy Evaluation Patient Details Name: Carolyn Valdez MRN: [id number] DOB: 1940/10/20 Today's Date: 01/26/2017   History of Present Illness  Pt is a 76 y.o. female presenting to hospital with SOB and hypoxia (pt with recent hospitalization for AMS secondary to catatonic depression).  Pt admitted to hospital with acute on chronic respiratory failure with hypoxia and acute on chronic CHF (diastolic dysfunction).  PMH includes breast CA, CHF, DM, h/o R TKR, htn, SVT, and seizures.  Clinical Impression  Prior to hospital admission, pt reports requiring assist with bed mobility and transfers to w/c.  Pt lives at [facility] ([place]).  Currently pt is 1-2 assist with bed mobility and with max assist of therapist unable to laterally scoot pt to L (sitting on edge of bed) d/t overall weakness.  Pt would benefit from skilled PT to address noted impairments and functional limitations.  Pt requiring encouragement to participate and does not appear to be a good candidate for STR at this time.  Recommend pt discharge back to facility with 24/7 assist and HHPT when medically appropriate.    Follow Up Recommendations Other (comment);Supervision/Assistance - 24 hour (Return to facility with HHPT)    Equipment Recommendations   (pt already has RW and w/c at facility)    Recommendations for Other Services       Precautions / Restrictions Precautions Precautions: Fall Restrictions Weight Bearing Restrictions: No      Mobility  Bed Mobility Overal bed mobility: Needs Assistance Bed Mobility: Supine to Sit;Sit to Supine     Supine to sit: Mod assist;Max assist;HOB elevated Sit to supine: Mod assist;Max assist;+2 for physical assistance   General bed mobility comments: assist for trunk and B LE's supine to/from sit; vc's for bedrail use and technique; 2 assist to boost pt up in bed end of session  Transfers Overall transfer level: Needs assistance Equipment used: None Transfers:  Lateral/Scoot Transfers          Lateral/Scoot Transfers: Max assist General transfer comment: pt unable to clear her bottom from bed to perform lateral scoot to L (towards head of bed) with B knees blocked and max assist of therapist  Ambulation/Gait                Stairs            Wheelchair Mobility    Modified Rankin (Stroke Patients Only)       Balance Overall balance assessment: Needs assistance Sitting-balance support: Bilateral upper extremity supported;Feet supported Sitting balance-Leahy Scale: Poor Sitting balance - Comments: Pt initially close SBA but with time sitting on edge of bed pt requiring CGA then min assist d/t trunk and UE fatigue (sitting edge of bed x10 minutes)                                     Pertinent Vitals/Pain Pain Assessment: 0-10 Pain Score: 5  (1/10 at rest end of session) Pain Location: chronic back pain with movement Pain Descriptors / Indicators: Sore Pain Intervention(s): Limited activity within patient's tolerance;Monitored during session;Repositioned  HR WFL and O2 >91% on 4 L O2 via nasal cannula during session.    Home Living Family/patient expects to be discharged to:: Skilled nursing facility                 Additional Comments: Lives at [facility] (ALF LTC)    Prior Function Level of Independence: Needs  assistance   Gait / Transfers Assistance Needed: Pt reports requiring some assist with bed mobility and transfers (1-2 assist depending on how weak she feels); pt uses RW to transfer to w/c.  Pt reports not using lift for transfers and does not like it.  ADL's / Homemaking Assistance Needed: Needs assist with all ADLs        Hand Dominance        Extremity/Trunk Assessment   Upper Extremity Assessment Upper Extremity Assessment: Generalized weakness    Lower Extremity Assessment Lower Extremity Assessment: Generalized weakness       Communication   Communication: No  difficulties  Cognition Arousal/Alertness: Awake/alert Behavior During Therapy: Anxious Overall Cognitive Status:  (Oriented to person, place, situation, and month (not year))                                        General Comments General comments (skin integrity, edema, etc.): B LE edema noted  Nursing cleared pt for participation in physical therapy.  Pt agreeable to PT session.    Exercises  Sitting balance with trunk strengthening   Assessment/Plan    PT Assessment Patient needs continued PT services  PT Problem List Decreased strength;Decreased activity tolerance;Decreased balance;Decreased mobility       PT Treatment Interventions DME instruction;Functional mobility training;Therapeutic activities;Therapeutic exercise;Balance training;Patient/family education    PT Goals (Current goals can be found in the Care Plan section)  Acute Rehab PT Goals Patient Stated Goal: to get stronger PT Goal Formulation: With patient Time For Goal Achievement: 02/09/17 Potential to Achieve Goals: Fair    Frequency Min 2X/week   Barriers to discharge        Co-evaluation               End of Session Equipment Utilized During Treatment: Oxygen (4 L O2 via nasal cannula) Activity Tolerance: Patient limited by fatigue Patient left: in bed;with call bell/phone within reach;with bed alarm set (B heels elevated via pillows) Nurse Communication: Mobility status;Precautions PT Visit Diagnosis: Muscle weakness (generalized) (M62.81);Difficulty in walking, not elsewhere classified (R26.2)    Time: 1410-1444 PT Time Calculation (min) (ACUTE ONLY): 34 min   Charges:   PT Evaluation $PT Eval Low Complexity: 1 Procedure PT Treatments $Therapeutic Exercise: 8-22 mins   PT G CodesLeitha Bleak, PT 01/26/17, 3:16 PM [phone]

## 2017-01-27 LAB — BASIC METABOLIC PANEL
ANION GAP: 5 (ref 5–15)
BUN: 30 mg/dL — ABNORMAL HIGH (ref 6–20)
CO2: 43 mmol/L — ABNORMAL HIGH (ref 22–32)
Calcium: 9.5 mg/dL (ref 8.9–10.3)
Chloride: 82 mmol/L — ABNORMAL LOW (ref 101–111)
Creatinine, Ser: 1.12 mg/dL — ABNORMAL HIGH (ref 0.44–1.00)
GFR, EST AFRICAN AMERICAN: 55 mL/min — AB (ref >60)
GFR, EST NON AFRICAN AMERICAN: 47 mL/min — AB (ref >60)
GLUCOSE: 170 mg/dL — AB (ref 65–99)
Potassium: 5.2 mmol/L — ABNORMAL HIGH (ref 3.5–5.1)
Sodium: 130 mmol/L — ABNORMAL LOW (ref 135–145)

## 2017-01-27 LAB — GLUCOSE, CAPILLARY
GLUCOSE-CAPILLARY: 190 mg/dL — AB (ref 65–99)
GLUCOSE-CAPILLARY: 175 mg/dL — AB (ref 65–99)
GLUCOSE-CAPILLARY: 202 mg/dL — AB (ref 65–99)
GLUCOSE-CAPILLARY: 230 mg/dL — AB (ref 65–99)

## 2017-01-27 MED ORDER — LEVOFLOXACIN 250 MG PO TABS
500.0000 mg | ORAL_TABLET | Freq: Every day | ORAL | Status: DC
  Administered 2017-01-27 – 2017-01-29 (×3): 500 mg via ORAL
  Filled 2017-01-27: qty 2
  Filled 2017-01-27: qty 1
  Filled 2017-01-27: qty 2
  Filled 2017-01-27: qty 1
  Filled 2017-01-27: qty 2

## 2017-01-27 NOTE — Plan of Care (Signed)
Problem: Physical Regulation: Goal: Ability to maintain clinical measurements within normal limits will improve Outcome: Not Progressing Severe anxiety responsive to PRN Ativan; struggled with patient to keep O2 on;

## 2017-01-27 NOTE — Progress Notes (Signed)
Attempted to place pt on [facility] CPAP C-4. Pt immediately removed mask and refused to attempt to wear or try again. Dawn, RN was at bedside and is aware.

## 2017-01-27 NOTE — Progress Notes (Signed)
Called MD about pts BP and being sleepy. Per MD give BP meds and discontinue clonopin as pt in lethergic.

## 2017-01-27 NOTE — Progress Notes (Signed)
PHARMACIST - PHYSICIAN COMMUNICATION DR:   Patel CONCERNING: Antibiotic IV to Oral Route Change Policy  RECOMMENDATION: This patient is receiving Levaquin by the intravenous route.  Based on criteria approved by the Pharmacy and Therapeutics Committee, the antibiotic(s) is/are being converted to the equivalent oral dose form(s).   DESCRIPTION: These criteria include:  Patient being treated for a respiratory tract infection, urinary tract infection, cellulitis or clostridium difficile associated diarrhea if on metronidazole  The patient is not neutropenic and does not exhibit a GI malabsorption state  The patient is eating (either orally or via tube) and/or has been taking other orally administered medications for a least 24 hours  The patient is improving clinically and has a Tmax < 100.5  If you have questions about this conversion, please contact the Pharmacy Department  []  ( [phone] )  [place] [x]  ( [phone] )  [facility] []  ( [phone]  [place] []  ( [phone] )  [facility] []  ( [phone] )  [facility]   Elin Seats, PharmD Clinical Pharmacist  

## 2017-01-27 NOTE — Progress Notes (Signed)
[facility]                                                                                                                                                                                  Patient Demographics   Carolyn Valdez, is a 76 y.o. female, DOB - 1940-11-21, BHA:[id number]  Admit date - 01/23/2017   Admitting Physician Henreitta Leber, MD  Outpatient Primary MD for the patient is [name], [name], MD   LOS - 4  Subjective: Patient feels little more sleepy was started on Klonopin yesterday at her request. Continues to complain of shortness of breath   Review of Systems:   CONSTITUTIONAL: No documented fever. No fatigue, weakness. No weight gain, no weight loss.  EYES: No blurry or double vision.  ENT: No tinnitus. No postnasal drip. No redness of the oropharynx.  RESPIRATORY: No cough, no wheeze, no hemoptysis. Positive dyspnea.  CARDIOVASCULAR: No chest pain. No orthopnea. No palpitations. No syncope.  GASTROINTESTINAL: No nausea, no vomiting or diarrhea. No abdominal pain. No melena or hematochezia.  GENITOURINARY: No dysuria or hematuria.  ENDOCRINE: No polyuria or nocturia. No heat or cold intolerance.  HEMATOLOGY: No anemia. No bruising. No bleeding.  INTEGUMENTARY: No rashes. No lesions.  MUSCULOSKELETAL: No arthritis. No swelling. No gout.  NEUROLOGIC: No numbness, tingling, or ataxia. No seizure-type activity.  PSYCHIATRIC: No anxiety. No insomnia. No ADD.    Vitals:   Vitals:   01/27/17 0314 01/27/17 0317 01/27/17 0446 01/27/17 1102  BP:  (!) 166/64 (!) 137/41 (!) 123/40  Pulse: 80 76 64 63  Resp:   (!) 24   Temp:   97.7 F (36.5 C)   TempSrc:   Oral   SpO2: 94% 95% 100%   Weight:      Height:        Wt Readings from Last 3 Encounters:  01/23/17 280 lb (127 kg)  10/20/16 254 lb (115.2 kg)  09/11/16 290 lb (131.5 kg)     Intake/Output Summary (Last 24 hours) at 01/27/17 1228 Last data filed at 01/27/17 9735  Gross per 24 hour  Intake              723 ml  Output                0 ml  Net              723 ml    Physical Exam:   GENERAL: Pleasant-appearing in no apparent distress.  HEAD, EYES, EARS, NOSE AND THROAT: Atraumatic, normocephalic. Extraocular muscles are intact. Pupils equal and reactive to light. Sclerae anicteric. No conjunctival injection. No oro-pharyngeal erythema.  NECK: Supple. There is no jugular venous distention. No bruits, no lymphadenopathy, no thyromegaly.  HEART: Regular rate and rhythm,. No murmurs, no rubs, no clicks.  LUNGS: Clear to auscultation bilaterally. No rales or rhonchi. No wheezes.  ABDOMEN: Soft, flat, nontender, nondistended. Has good bowel sounds. No hepatosplenomegaly appreciated.  EXTREMITIES: No evidence of any cyanosis, clubbing, 2+ peripheral edema.  +2 pedal and radial pulses bilaterally.  NEUROLOGIC: The patient is alert, awake, and oriented x3 with no focal motor or sensory deficits appreciated bilaterally.  SKIN: Moist and warm with no rashes appreciated.  Psych: Not anxious, depressed LN: No inguinal LN enlargement    Antibiotics   Anti-infectives    Start     Dose/Rate Route Frequency Ordered Stop   01/25/17 2000  ciprofloxacin (CIPRO) tablet 500 mg  Status:  Discontinued     500 mg Oral 2 times daily 01/25/17 1347 01/26/17 1249   01/25/17 1600  levofloxacin (LEVAQUIN) IVPB 500 mg     500 mg 100 mL/hr over 60 Minutes Intravenous Every 24 hours 01/25/17 1448        Medications   Scheduled Meds: . amLODipine  2.5 mg Oral Daily  . atorvastatin  20 mg Oral Daily  . desmopressin  0.2 mg Oral QHS  . docusate sodium  100 mg Oral BID  . enoxaparin (LOVENOX) injection  40 mg Subcutaneous Q12H  . feeding supplement (ENSURE ENLIVE)  237 mL Oral TID BM  . furosemide  40 mg Intravenous Q12H  . guaiFENesin  600 mg Oral BID  . hydrOXYzine  25 mg Oral QHS  . insulin aspart  0-20 Units Subcutaneous TID WC  . insulin aspart  0-5 Units  Subcutaneous QHS  . insulin glargine  43 Units Subcutaneous QHS  . iron polysaccharides  150 mg Oral TID  . lamoTRIgine  25 mg Oral Daily  . levETIRAcetam  250 mg Oral BID  . levofloxacin (LEVAQUIN) IV  500 mg Intravenous Q24H  . loratadine  10 mg Oral Daily  . mouth rinse  15 mL Mouth Rinse BID  . metoprolol  50 mg Oral BID  . nystatin   Topical TID  . OLANZapine zydis  10 mg Oral QHS  . oxybutynin  10 mg Oral Daily  . pantoprazole  40 mg Oral BID AC  . pioglitazone  30 mg Oral Daily  . polyethylene glycol  17 g Oral Daily  . senna  1 tablet Oral Daily  . sodium chloride flush  3 mL Intravenous Q12H  . vitamin E  1,000 Units Oral Daily   Continuous Infusions: PRN Meds:.acetaminophen **OR** acetaminophen, benzonatate, fluticasone, LORazepam, metoCLOPramide, ondansetron **OR** ondansetron (ZOFRAN) IV, traMADol   Data Review:   Micro Results Recent Results (from the past 240 hour(s))  MRSA PCR Screening     Status: None   Collection Time: 01/23/17  8:56 AM  Result Value Ref Range Status   MRSA by PCR NEGATIVE NEGATIVE Final    Comment:        The GeneXpert MRSA Assay (FDA approved for NASAL specimens only), is one component of a comprehensive MRSA colonization surveillance program. It is not intended to diagnose MRSA infection nor to guide or monitor treatment for MRSA infections.     Radiology Reports Dg Chest 1 View  Result Date: 01/23/2017 CLINICAL DATA:  SOB x 2 days Hx - CHF, HTN, SVT, diabetes, breast cancer - right side 1997, former smoker. EXAM: CHEST 1 VIEW COMPARISON:  Chest x-rays dated 10/21/2016, 09/11/2016 and 12/24/2015  FINDINGS: Stable cardiomegaly. Atherosclerotic changes again noted at the aortic arch. Central pulmonary vascular congestion and bilateral interstitial edema, likely superimposed on chronic bronchitic changes and chronic interstitial lung disease. Probable small left pleural effusion and mild bibasilar atelectasis. IMPRESSION: 1. Cardiomegaly  with central pulmonary vascular congestion and bilateral interstitial edema indicating mild CHF/volume overload. Based on earlier exams, this appears to be superimposed on chronic bronchitic changes and some degree of chronic interstitial lung disease. 2. No overt alveolar pulmonary edema.  No evidence of pneumonia. 3. Probable small left pleural effusion and mild bibasilar atelectasis. Electronically Signed   By: Franki Cabot M.D.   On: 01/23/2017 09:22   Ct Angio Chest Pe W Or Wo Contrast  Result Date: 01/24/2017 CLINICAL DATA:  Shortness of Breath EXAM: CT ANGIOGRAPHY CHEST WITH CONTRAST TECHNIQUE: Multidetector CT imaging of the chest was performed using the standard protocol during bolus administration of intravenous contrast. Multiplanar CT image reconstructions and MIPs were obtained to evaluate the vascular anatomy. CONTRAST:  75 cc Isovue COMPARISON:  None. FINDINGS: Cardiovascular: Cardiomegaly is noted. No pericardial effusion. Mitral valve calcifications are noted. Atherosclerotic calcifications of coronary arteries. The the no pulmonary embolus is noted. Atherosclerotic calcifications of thoracic aorta. No aortic aneurysm or aortic dissection. Mediastinum/Nodes: Sub- carinal lymph node measures 1.6 by 3 cm. AP window lymph node Measures 1.3 cm short-axis. Right hilar lymph node measures 1.6 cm short-axis. Left hilar lymph node Measures 1 cm short-axis. There is debris noted within esophagus. Mild thickening of distal esophageal wall. Findings highly suspicious for gastroesophageal reflux. The the right pretracheal lymph node measures 1.4 cm short-axis. There is a low-density nodule right lobe of thyroid gland measures 1.1 cm. Lungs/Pleura: There is bilateral small pleural effusion. Bilateral lower lobe posterior atelectasis or infiltrate right greater than left. Mild interstitial prominence bilateral upper lobes. There is patchy ground-glass attenuation bilateral upper lobes. Findings highly  suspicious for mild pulmonary edema. Less likely upper lobe pneumonia. Upper Abdomen: Visualized upper abdomen shows no adrenal gland mass. There is mild splenomegaly. The spleen measures 14.2 cm in length. Postcholecystectomy surgical clips are noted. Visualized pancreas and liver is unremarkable. Musculoskeletal: No destructive bony lesions are noted. Sagittal images of the spine shows mild degenerative changes mid and lower thoracic spine. Review of the MIP images confirms the above findings. IMPRESSION: 1. No pulmonary embolus is noted. 2. There is mediastinal and hilar adenopathy. Although might be reactive lymphoproliferative disease or metastatic disease cannot be entirely excluded. Follow-up examination in 3 months is recommended to assure stability or resolution. 3. There is debris noted throughout the esophagus. Mild thickening of distal esophageal wall. Gastroesophageal reflux or reflux esophagitis cannot be excluded. Less likely achalasia. 4. There is bilateral small pleural effusion. Bilateral lower lobe atelectasis or infiltrate right greater than left. Mild interstitial prominence and patchy ground-glass attenuation bilaterally suspicious for mild pulmonary edema. Less likely diffuse bilateral pneumonia. 5. Mild degenerative changes thoracic spine. 6. Mild splenomegaly.  The spleen measures 14.2 cm in length. Electronically Signed   By: Lahoma Crocker M.D.   On: 01/24/2017 13:20     CBC  Recent Labs Lab 01/23/17 0904 01/24/17 0405 01/26/17 0800  WBC 7.6 6.9 8.4  HGB 8.9* 8.3* 8.6*  HCT 26.3* 25.3* 25.9*  PLT 407 345 344  MCV 82.8 83.4 82.1  MCH 28.0 27.5 27.4  MCHC 33.8 32.9 33.3  RDW 14.7* 14.5 14.9*    Chemistries   Recent Labs Lab 01/23/17 0904 01/24/17 0319 01/26/17 0800 01/27/17 0507  NA  133* 129* 130* 130*  K 5.0 4.8 5.1 5.2*  CL 90* 86* 82* 82*  CO2 37* 37* 43* 43*  GLUCOSE 98 169* 172* 170*  BUN 28* 25* 26* 30*  CREATININE 1.08* 0.88 1.11* 1.12*  CALCIUM 9.8 9.6  9.6 9.5  AST 15  --   --   --   ALT 9*  --   --   --   ALKPHOS 74  --   --   --   BILITOT 0.9  --   --   --    ------------------------------------------------------------------------------------------------------------------ estimated creatinine clearance is 61.1 mL/min (A) (by C-G formula based on SCr of 1.12 mg/dL (H)). ------------------------------------------------------------------------------------------------------------------ No results for input(s): HGBA1C in the last 72 hours. ------------------------------------------------------------------------------------------------------------------ No results for input(s): CHOL, HDL, LDLCALC, TRIG, CHOLHDL, LDLDIRECT in the last 72 hours. ------------------------------------------------------------------------------------------------------------------ No results for input(s): TSH, T4TOTAL, T3FREE, THYROIDAB in the last 72 hours.  Invalid input(s): FREET3 ------------------------------------------------------------------------------------------------------------------ No results for input(s): VITAMINB12, FOLATE, FERRITIN, TIBC, IRON, RETICCTPCT in the last 72 hours.  Coagulation profile No results for input(s): INR, PROTIME in the last 168 hours.  No results for input(s): DDIMER in the last 72 hours.  Cardiac Enzymes  Recent Labs Lab 01/23/17 0904  TROPONINI <0.03   ------------------------------------------------------------------------------------------------------------------ Invalid input(s): POCBNP    Assessment & Plan   A65 year old female with past medical history of anxiety, depression, hypertension, diabetes, obstructive sleep apnea, chronic respiratory failure, chronic diastolic CHF, history of recurrent UTIs, who presents to the hospital due to shortness of breath and hypoxia.  1. Acute on chronic respiratory failure with hypoxia-secondary to volume overload and mild CHF. Change IV Lasix to oral , I continue  incentive spirometry Continue IV Levaquin for pneumonia We'll try CPAP at nighttime  2. CHF-acute on chronic diastolic dysfunction. -Continue IV Lasix -Continue metoprolol.  3. Diabetes type 2 without complication-continue Lantus, sliding scale insulin, Actos. Follow blood sugars.  4. History of seizures-continue Keppra, Lamictal.  5. History of depression with catatonia-continue Zyprexa.  6. History of recurrent UTIs-. Positive UA will treat with antibiotics  7. GERD-continue Protonix.  8. Hyperlipidemia-continue atorvastatin.  9. Essential hypertension-continue Toprol, Norvasc.       Code Status Orders        Start     Ordered   01/23/17 1320  Full code  Continuous     01/23/17 1320    Code Status History    Date Active Date Inactive Code Status Order ID Comments User Context   10/05/2016 12:16 PM 10/26/2016  5:40 PM DNR [id number]  Knox Royalty, NP Inpatient   09/24/2016  5:31 PM 10/05/2016 12:16 PM Full Code [id number]  Gladstone Lighter, MD Inpatient   09/01/2016  2:17 AM 09/05/2016  4:28 PM Full Code [id number]  Harvie Bridge, DO Inpatient   08/09/2016  8:27 PM 08/12/2016  8:15 PM Full Code [id number]  Henreitta Leber, MD Inpatient   09/30/2015  2:23 PM 10/07/2015  5:37 PM Full Code [id number]  Loletha Grayer, MD ED   09/05/2015  5:56 PM 09/09/2015  5:38 PM DNR [id number]  Loletha Grayer, MD ED    Advance Directive Documentation     Most Recent Value  Type of Advance Directive  Healthcare Power of [place], Living will  Pre-existing out of facility DNR order (yellow form or pink MOST form)  -  "MOST" Form in Place?  -           Consults  none   DVT Prophylaxis  Lovenox   Lab Results  Component Value Date   PLT 344 01/26/2017     Time Spent in minutes   96min  Greater than 50% of time spent in care coordination and counseling patient regarding the condition and plan of care.   Dustin Flock M.D on 01/27/2017 at 12:28 PM  Between  7am to 6pm - Pager - [phone]  After 6pm go to [email or web address] - password [email or web address]   Office  [phone]

## 2017-01-28 ENCOUNTER — Inpatient Hospital Stay: Payer: Medicare Other

## 2017-01-28 DIAGNOSIS — F4322 Adjustment disorder with anxiety: Secondary | ICD-10-CM

## 2017-01-28 LAB — TROPONIN I

## 2017-01-28 LAB — GLUCOSE, CAPILLARY
GLUCOSE-CAPILLARY: 157 mg/dL — AB (ref 65–99)
GLUCOSE-CAPILLARY: 215 mg/dL — AB (ref 65–99)
GLUCOSE-CAPILLARY: 221 mg/dL — AB (ref 65–99)
Glucose-Capillary: 213 mg/dL — ABNORMAL HIGH (ref 65–99)

## 2017-01-28 MED ORDER — CLONAZEPAM 0.125 MG PO TBDP
0.2500 mg | ORAL_TABLET | Freq: Three times a day (TID) | ORAL | Status: DC
  Administered 2017-01-28 – 2017-01-29 (×4): 0.25 mg via ORAL
  Filled 2017-01-28 (×4): qty 2

## 2017-01-28 MED ORDER — FUROSEMIDE 10 MG/ML IJ SOLN
40.0000 mg | Freq: Two times a day (BID) | INTRAMUSCULAR | Status: DC
  Administered 2017-01-29: 40 mg via INTRAVENOUS
  Filled 2017-01-28: qty 4

## 2017-01-28 MED ORDER — FUROSEMIDE 10 MG/ML IJ SOLN
20.0000 mg | Freq: Once | INTRAMUSCULAR | Status: AC
  Administered 2017-01-28: 20 mg via INTRAVENOUS
  Filled 2017-01-28: qty 2

## 2017-01-28 MED ORDER — FUROSEMIDE 10 MG/ML IJ SOLN
20.0000 mg | Freq: Two times a day (BID) | INTRAMUSCULAR | Status: DC
  Administered 2017-01-28: 20 mg via INTRAVENOUS
  Filled 2017-01-28: qty 2

## 2017-01-28 NOTE — Progress Notes (Signed)
[place] at [facility]                                                                                                                                                                                  Patient Demographics   Carolyn Valdez, is a 76 y.o. female, DOB - [birth year], ZOX:[id number]  Admit date - 01/23/2017   Admitting Physician Henreitta Leber, MD  Outpatient Primary MD for the patient is [name], DAVID Mamie Nick, MD   LOS - 5  Subjective: Called by nurse stating that she had called a rapid response on the patient due to shallow breathing I evaluated the patient. She is having some irregular breathing however she is able to answer questions and awake her saturations are well.    Review of Systems:   CONSTITUTIONAL: No documented fever. No fatigue, weakness. No weight gain, no weight loss.  EYES: No blurry or double vision.  ENT: No tinnitus. No postnasal drip. No redness of the oropharynx.  RESPIRATORY: No cough, no wheeze, no hemoptysis. No dyspnea.  CARDIOVASCULAR: No chest pain. No orthopnea. No palpitations. No syncope.  GASTROINTESTINAL: No nausea, no vomiting or diarrhea. No abdominal pain. No melena or hematochezia.  GENITOURINARY: No dysuria or hematuria.  ENDOCRINE: No polyuria or nocturia. No heat or cold intolerance.  HEMATOLOGY: No anemia. No bruising. No bleeding.  INTEGUMENTARY: No rashes. No lesions.  MUSCULOSKELETAL: No arthritis. No swelling. No gout.  NEUROLOGIC: No numbness, tingling, or ataxia. No seizure-type activity.  PSYCHIATRIC: No anxiety. No insomnia. No ADD.    Vitals:   Vitals:   01/28/17 0415 01/28/17 1023 01/28/17 1226 01/28/17 1334  BP:  (!) 150/49 (!) 145/52   Pulse: 75 81 88   Resp: (!) 24  19 (!) 28  Temp:   98.2 F (36.8 C)   TempSrc:   Oral   SpO2: 100% 97% 98%   Weight:      Height:        Wt Readings from Last 3 Encounters:  01/23/17 280 lb (127 kg)  10/20/16 254 lb (115.2 kg)  09/11/16 290 lb (131.5  kg)     Intake/Output Summary (Last 24 hours) at 01/28/17 1344 Last data filed at 01/28/17 1100  Gross per 24 hour  Intake              340 ml  Output                0 ml  Net              340 ml    Physical Exam:   GENERAL: Pleasant-appearing in no apparent distress.  HEAD, EYES, EARS, NOSE  AND THROAT: Atraumatic, normocephalic. Extraocular muscles are intact. Pupils equal and reactive to light. Sclerae anicteric. No conjunctival injection. No oro-pharyngeal erythema.  NECK: Supple. There is no jugular venous distention. No bruits, no lymphadenopathy, no thyromegaly.  HEART: Regular rate and rhythm,. No murmurs, no rubs, no clicks.  LUNGS: Clear to auscultation bilaterally. No rales or rhonchi. No wheezes.  ABDOMEN: Soft, flat, nontender, nondistended. Has good bowel sounds. No hepatosplenomegaly appreciated.  EXTREMITIES: No evidence of any cyanosis, clubbing, or peripheral edema.  +2 pedal and radial pulses bilaterally.  NEUROLOGIC: The patient is alert, awake, and oriented x3 with no focal motor or sensory deficits appreciated bilaterally.  SKIN: Moist and warm with no rashes appreciated.  Psych: Not anxious, depressed LN: No inguinal LN enlargement    Antibiotics   Anti-infectives    Start     Dose/Rate Route Frequency Ordered Stop   01/27/17 1500  levofloxacin (LEVAQUIN) tablet 500 mg     500 mg Oral Daily 01/27/17 1339     01/25/17 2000  ciprofloxacin (CIPRO) tablet 500 mg  Status:  Discontinued     500 mg Oral 2 times daily 01/25/17 1347 01/26/17 1249   01/25/17 1600  levofloxacin (LEVAQUIN) IVPB 500 mg  Status:  Discontinued     500 mg 100 mL/hr over 60 Minutes Intravenous Every 24 hours 01/25/17 1448 01/27/17 1339      Medications   Scheduled Meds: . amLODipine  2.5 mg Oral Daily  . atorvastatin  20 mg Oral Daily  . desmopressin  0.2 mg Oral QHS  . docusate sodium  100 mg Oral BID  . enoxaparin (LOVENOX) injection  40 mg Subcutaneous Q12H  . feeding  supplement (ENSURE ENLIVE)  237 mL Oral TID BM  . furosemide  20 mg Intravenous Q12H  . guaiFENesin  600 mg Oral BID  . hydrOXYzine  25 mg Oral QHS  . insulin aspart  0-20 Units Subcutaneous TID WC  . insulin aspart  0-5 Units Subcutaneous QHS  . insulin glargine  43 Units Subcutaneous QHS  . iron polysaccharides  150 mg Oral TID  . lamoTRIgine  25 mg Oral Daily  . levETIRAcetam  250 mg Oral BID  . levofloxacin  500 mg Oral Daily  . loratadine  10 mg Oral Daily  . mouth rinse  15 mL Mouth Rinse BID  . metoprolol  50 mg Oral BID  . nystatin   Topical TID  . OLANZapine zydis  10 mg Oral QHS  . oxybutynin  10 mg Oral Daily  . pantoprazole  40 mg Oral BID AC  . pioglitazone  30 mg Oral Daily  . polyethylene glycol  17 g Oral Daily  . senna  1 tablet Oral Daily  . sodium chloride flush  3 mL Intravenous Q12H  . vitamin E  1,000 Units Oral Daily   Continuous Infusions: PRN Meds:.acetaminophen **OR** acetaminophen, benzonatate, fluticasone, LORazepam, metoCLOPramide, ondansetron **OR** ondansetron (ZOFRAN) IV   Data Review:   Micro Results Recent Results (from the past 240 hour(s))  MRSA PCR Screening     Status: None   Collection Time: 01/23/17  8:56 AM  Result Value Ref Range Status   MRSA by PCR NEGATIVE NEGATIVE Final    Comment:        The GeneXpert MRSA Assay (FDA approved for NASAL specimens only), is one component of a comprehensive MRSA colonization surveillance program. It is not intended to diagnose MRSA infection nor to guide or monitor treatment for MRSA infections.  Radiology Reports Dg Chest 1 View  Result Date: 01/23/2017 CLINICAL DATA:  SOB x 2 days Hx - CHF, HTN, SVT, diabetes, breast cancer - right side 1997, former smoker. EXAM: CHEST 1 VIEW COMPARISON:  Chest x-rays dated 10/21/2016, 09/11/2016 and 12/24/2015 FINDINGS: Stable cardiomegaly. Atherosclerotic changes again noted at the aortic arch. Central pulmonary vascular congestion and bilateral  interstitial edema, likely superimposed on chronic bronchitic changes and chronic interstitial lung disease. Probable small left pleural effusion and mild bibasilar atelectasis. IMPRESSION: 1. Cardiomegaly with central pulmonary vascular congestion and bilateral interstitial edema indicating mild CHF/volume overload. Based on earlier exams, this appears to be superimposed on chronic bronchitic changes and some degree of chronic interstitial lung disease. 2. No overt alveolar pulmonary edema.  No evidence of pneumonia. 3. Probable small left pleural effusion and mild bibasilar atelectasis. Electronically Signed   By: Franki Cabot M.D.   On: 01/23/2017 09:22   Ct Angio Chest Pe W Or Wo Contrast  Result Date: 01/24/2017 CLINICAL DATA:  Shortness of Breath EXAM: CT ANGIOGRAPHY CHEST WITH CONTRAST TECHNIQUE: Multidetector CT imaging of the chest was performed using the standard protocol during bolus administration of intravenous contrast. Multiplanar CT image reconstructions and MIPs were obtained to evaluate the vascular anatomy. CONTRAST:  75 cc Isovue COMPARISON:  None. FINDINGS: Cardiovascular: Cardiomegaly is noted. No pericardial effusion. Mitral valve calcifications are noted. Atherosclerotic calcifications of coronary arteries. The the no pulmonary embolus is noted. Atherosclerotic calcifications of thoracic aorta. No aortic aneurysm or aortic dissection. Mediastinum/Nodes: Sub- carinal lymph node measures 1.6 by 3 cm. AP window lymph node Measures 1.3 cm short-axis. Right hilar lymph node measures 1.6 cm short-axis. Left hilar lymph node Measures 1 cm short-axis. There is debris noted within esophagus. Mild thickening of distal esophageal wall. Findings highly suspicious for gastroesophageal reflux. The the right pretracheal lymph node measures 1.4 cm short-axis. There is a low-density nodule right lobe of thyroid gland measures 1.1 cm. Lungs/Pleura: There is bilateral small pleural effusion. Bilateral lower  lobe posterior atelectasis or infiltrate right greater than left. Mild interstitial prominence bilateral upper lobes. There is patchy ground-glass attenuation bilateral upper lobes. Findings highly suspicious for mild pulmonary edema. Less likely upper lobe pneumonia. Upper Abdomen: Visualized upper abdomen shows no adrenal gland mass. There is mild splenomegaly. The spleen measures 14.2 cm in length. Postcholecystectomy surgical clips are noted. Visualized pancreas and liver is unremarkable. Musculoskeletal: No destructive bony lesions are noted. Sagittal images of the spine shows mild degenerative changes mid and lower thoracic spine. Review of the MIP images confirms the above findings. IMPRESSION: 1. No pulmonary embolus is noted. 2. There is mediastinal and hilar adenopathy. Although might be reactive lymphoproliferative disease or metastatic disease cannot be entirely excluded. Follow-up examination in 3 months is recommended to assure stability or resolution. 3. There is debris noted throughout the esophagus. Mild thickening of distal esophageal wall. Gastroesophageal reflux or reflux esophagitis cannot be excluded. Less likely achalasia. 4. There is bilateral small pleural effusion. Bilateral lower lobe atelectasis or infiltrate right greater than left. Mild interstitial prominence and patchy ground-glass attenuation bilaterally suspicious for mild pulmonary edema. Less likely diffuse bilateral pneumonia. 5. Mild degenerative changes thoracic spine. 6. Mild splenomegaly.  The spleen measures 14.2 cm in length. Electronically Signed   By: Lahoma Crocker M.D.   On: 01/24/2017 13:20     CBC  Recent Labs Lab 01/23/17 0904 01/24/17 0405 01/26/17 0800  WBC 7.6 6.9 8.4  HGB 8.9* 8.3* 8.6*  HCT 26.3* 25.3* 25.9*  PLT 407 345 344  MCV 82.8 83.4 82.1  MCH 28.0 27.5 27.4  MCHC 33.8 32.9 33.3  RDW 14.7* 14.5 14.9*    Chemistries   Recent Labs Lab 01/23/17 0904 01/24/17 0319 01/26/17 0800  01/27/17 0507  NA 133* 129* 130* 130*  K 5.0 4.8 5.1 5.2*  CL 90* 86* 82* 82*  CO2 37* 37* 43* 43*  GLUCOSE 98 169* 172* 170*  BUN 28* 25* 26* 30*  CREATININE 1.08* 0.88 1.11* 1.12*  CALCIUM 9.8 9.6 9.6 9.5  AST 15  --   --   --   ALT 9*  --   --   --   ALKPHOS 74  --   --   --   BILITOT 0.9  --   --   --    ------------------------------------------------------------------------------------------------------------------ estimated creatinine clearance is 61.1 mL/min (A) (by C-G formula based on SCr of 1.12 mg/dL (H)). ------------------------------------------------------------------------------------------------------------------ No results for input(s): HGBA1C in the last 72 hours. ------------------------------------------------------------------------------------------------------------------ No results for input(s): CHOL, HDL, LDLCALC, TRIG, CHOLHDL, LDLDIRECT in the last 72 hours. ------------------------------------------------------------------------------------------------------------------ No results for input(s): TSH, T4TOTAL, T3FREE, THYROIDAB in the last 72 hours.  Invalid input(s): FREET3 ------------------------------------------------------------------------------------------------------------------ No results for input(s): VITAMINB12, FOLATE, FERRITIN, TIBC, IRON, RETICCTPCT in the last 72 hours.  Coagulation profile No results for input(s): INR, PROTIME in the last 168 hours.  No results for input(s): DDIMER in the last 72 hours.  Cardiac Enzymes  Recent Labs Lab 01/23/17 0904  TROPONINI <0.03   ------------------------------------------------------------------------------------------------------------------ Invalid input(s): POCBNP    Assessment & Plan   Patient is a 76 year old called by nurse for respiratory distress  1. Respiratory distress at this point I will check an ABG, check EKG and cardiac enzymes Obtain a chest x-ray. Further evaluation  based on that Patient's CO2 seems to be increasing based on her BMP will further evaluate 2. Drowsiness. All sedating medications     Code Status Orders        Start     Ordered   01/23/17 1320  Full code  Continuous     01/23/17 1320    Code Status History    Date Active Date Inactive Code Status Order ID Comments User Context   10/05/2016 12:16 PM 10/26/2016  5:40 PM DNR [id number]  Knox Royalty, NP Inpatient   09/24/2016  5:31 PM 10/05/2016 12:16 PM Full Code [id number]  Gladstone Lighter, MD Inpatient   09/01/2016  2:17 AM 09/05/2016  4:28 PM Full Code [id number]  Harvie Bridge, DO Inpatient   08/09/2016  8:27 PM 08/12/2016  8:15 PM Full Code [id number]  Henreitta Leber, MD Inpatient   09/30/2015  2:23 PM 10/07/2015  5:37 PM Full Code [id number]  Loletha Grayer, MD ED   09/05/2015  5:56 PM 09/09/2015  5:38 PM DNR [id number]  Loletha Grayer, MD ED    Advance Directive Documentation     Most Recent Value  Type of Advance Directive  Healthcare Power of [place], Living will  Pre-existing out of facility DNR order (yellow form or pink MOST form)  -  "MOST" Form in Place?  -            DVT Prophylaxis  Lovenox  Lab Results  Component Value Date   PLT 344 01/26/2017     Time Spent in minutes  76min Greater than 50% of time spent in care coordination and counseling patient regarding the condition and plan of care.   Dustin Flock M.D on 01/28/2017 at 1:44 PM  Between 7am to 6pm - Pager - [phone]  After 6pm go to [email or web address] - password [email or web address] [facility]   Office  [phone]

## 2017-01-28 NOTE — Progress Notes (Signed)
Discussed with patient regrading abg results, She is awake and I recommend she needs to wear bipap , she says she cant promise  I will try bipap tonight  Also d/w with her code she wants full code

## 2017-01-28 NOTE — Progress Notes (Signed)
Pt informed RT that she is unable to tolerate the CPAP. RT educated her on the benefits of the CPAP re to her condition. Pt noted that she has been advised to use it on many occassions but she has always refused as she did not think she could take it.

## 2017-01-28 NOTE — Progress Notes (Signed)
[place] [facility]                                                                                                                                                                                  Patient Demographics   Carolyn Valdez, is a 76 y.o. female, DOB - 1940-11-17, JME:[id number]  Admit date - 01/23/2017   Admitting Physician Henreitta Leber, MD  Outpatient Primary MD for the patient is [name], [name], MD   LOS - 5  Subjective: Continues to complain of shortness of breath and states that she's feeling anxious  Review of Systems:   CONSTITUTIONAL: No documented fever. No fatigue, weakness. No weight gain, no weight loss.  EYES: No blurry or double vision.  ENT: No tinnitus. No postnasal drip. No redness of the oropharynx.  RESPIRATORY: No cough, no wheeze, no hemoptysis. Positive dyspnea.  CARDIOVASCULAR: No chest pain. No orthopnea. No palpitations. No syncope.  GASTROINTESTINAL: No nausea, no vomiting or diarrhea. No abdominal pain. No melena or hematochezia.  GENITOURINARY: No dysuria or hematuria.  ENDOCRINE: No polyuria or nocturia. No heat or cold intolerance.  HEMATOLOGY: No anemia. No bruising. No bleeding.  INTEGUMENTARY: No rashes. No lesions.  MUSCULOSKELETAL: No arthritis. No swelling. No gout.  NEUROLOGIC: No numbness, tingling, or ataxia. No seizure-type activity.  PSYCHIATRIC: No anxiety. No insomnia. No ADD.    Vitals:   Vitals:   01/28/17 0415 01/28/17 1023 01/28/17 1226 01/28/17 1334  BP:  (!) 150/49 (!) 145/52   Pulse: 75 81 88   Resp: (!) 24  19 (!) 28  Temp:   98.2 F (36.8 C)   TempSrc:   Oral   SpO2: 100% 97% 98%   Weight:      Height:        Wt Readings from Last 3 Encounters:  01/23/17 280 lb (127 kg)  10/20/16 254 lb (115.2 kg)  09/11/16 290 lb (131.5 kg)     Intake/Output Summary (Last 24 hours) at 01/28/17 1339 Last data filed at 01/28/17 1100  Gross per 24 hour  Intake              340 ml   Output                0 ml  Net              340 ml    Physical Exam:   GENERAL: Pleasant-appearing in no apparent distress.  HEAD, EYES, EARS, NOSE AND THROAT: Atraumatic, normocephalic. Extraocular muscles are intact. Pupils equal and reactive to light. Sclerae anicteric. No conjunctival injection. No oro-pharyngeal erythema.  NECK: Supple. There is no jugular venous  distention. No bruits, no lymphadenopathy, no thyromegaly.  HEART: Regular rate and rhythm,. No murmurs, no rubs, no clicks.  LUNGS: Clear to auscultation bilaterally. No rales or rhonchi. No wheezes.  ABDOMEN: Soft, flat, nontender, nondistended. Has good bowel sounds. No hepatosplenomegaly appreciated.  EXTREMITIES: No evidence of any cyanosis, clubbing, 2+ peripheral edema.  +2 pedal and radial pulses bilaterally.  NEUROLOGIC: The patient is alert, awake, and oriented x3 with no focal motor or sensory deficits appreciated bilaterally.  SKIN: Moist and warm with no rashes appreciated.  Psych: Not anxious, depressed LN: No inguinal LN enlargement    Antibiotics   Anti-infectives    Start     Dose/Rate Route Frequency Ordered Stop   01/27/17 1500  levofloxacin (LEVAQUIN) tablet 500 mg     500 mg Oral Daily 01/27/17 1339     01/25/17 2000  ciprofloxacin (CIPRO) tablet 500 mg  Status:  Discontinued     500 mg Oral 2 times daily 01/25/17 1347 01/26/17 1249   01/25/17 1600  levofloxacin (LEVAQUIN) IVPB 500 mg  Status:  Discontinued     500 mg 100 mL/hr over 60 Minutes Intravenous Every 24 hours 01/25/17 1448 01/27/17 1339      Medications   Scheduled Meds: . amLODipine  2.5 mg Oral Daily  . atorvastatin  20 mg Oral Daily  . desmopressin  0.2 mg Oral QHS  . docusate sodium  100 mg Oral BID  . enoxaparin (LOVENOX) injection  40 mg Subcutaneous Q12H  . feeding supplement (ENSURE ENLIVE)  237 mL Oral TID BM  . guaiFENesin  600 mg Oral BID  . hydrOXYzine  25 mg Oral QHS  . insulin aspart  0-20 Units Subcutaneous  TID WC  . insulin aspart  0-5 Units Subcutaneous QHS  . insulin glargine  43 Units Subcutaneous QHS  . iron polysaccharides  150 mg Oral TID  . lamoTRIgine  25 mg Oral Daily  . levETIRAcetam  250 mg Oral BID  . levofloxacin  500 mg Oral Daily  . loratadine  10 mg Oral Daily  . mouth rinse  15 mL Mouth Rinse BID  . metoprolol  50 mg Oral BID  . nystatin   Topical TID  . OLANZapine zydis  10 mg Oral QHS  . oxybutynin  10 mg Oral Daily  . pantoprazole  40 mg Oral BID AC  . pioglitazone  30 mg Oral Daily  . polyethylene glycol  17 g Oral Daily  . senna  1 tablet Oral Daily  . sodium chloride flush  3 mL Intravenous Q12H  . vitamin E  1,000 Units Oral Daily   Continuous Infusions: PRN Meds:.acetaminophen **OR** acetaminophen, benzonatate, fluticasone, LORazepam, metoCLOPramide, ondansetron **OR** ondansetron (ZOFRAN) IV, traMADol   Data Review:   Micro Results Recent Results (from the past 240 hour(s))  MRSA PCR Screening     Status: None   Collection Time: 01/23/17  8:56 AM  Result Value Ref Range Status   MRSA by PCR NEGATIVE NEGATIVE Final    Comment:        The GeneXpert MRSA Assay (FDA approved for NASAL specimens only), is one component of a comprehensive MRSA colonization surveillance program. It is not intended to diagnose MRSA infection nor to guide or monitor treatment for MRSA infections.     Radiology Reports Dg Chest 1 View  Result Date: 01/23/2017 CLINICAL DATA:  SOB x 2 days Hx - CHF, HTN, SVT, diabetes, breast cancer - right side 1997, former smoker. EXAM: CHEST 1 VIEW  COMPARISON:  Chest x-rays dated 10/21/2016, 09/11/2016 and 12/24/2015 FINDINGS: Stable cardiomegaly. Atherosclerotic changes again noted at the aortic arch. Central pulmonary vascular congestion and bilateral interstitial edema, likely superimposed on chronic bronchitic changes and chronic interstitial lung disease. Probable small left pleural effusion and mild bibasilar atelectasis.  IMPRESSION: 1. Cardiomegaly with central pulmonary vascular congestion and bilateral interstitial edema indicating mild CHF/volume overload. Based on earlier exams, this appears to be superimposed on chronic bronchitic changes and some degree of chronic interstitial lung disease. 2. No overt alveolar pulmonary edema.  No evidence of pneumonia. 3. Probable small left pleural effusion and mild bibasilar atelectasis. Electronically Signed   By: Franki Cabot M.D.   On: 01/23/2017 09:22   Ct Angio Chest Pe W Or Wo Contrast  Result Date: 01/24/2017 CLINICAL DATA:  Shortness of Breath EXAM: CT ANGIOGRAPHY CHEST WITH CONTRAST TECHNIQUE: Multidetector CT imaging of the chest was performed using the standard protocol during bolus administration of intravenous contrast. Multiplanar CT image reconstructions and MIPs were obtained to evaluate the vascular anatomy. CONTRAST:  75 cc Isovue COMPARISON:  None. FINDINGS: Cardiovascular: Cardiomegaly is noted. No pericardial effusion. Mitral valve calcifications are noted. Atherosclerotic calcifications of coronary arteries. The the no pulmonary embolus is noted. Atherosclerotic calcifications of thoracic aorta. No aortic aneurysm or aortic dissection. Mediastinum/Nodes: Sub- carinal lymph node measures 1.6 by 3 cm. AP window lymph node Measures 1.3 cm short-axis. Right hilar lymph node measures 1.6 cm short-axis. Left hilar lymph node Measures 1 cm short-axis. There is debris noted within esophagus. Mild thickening of distal esophageal wall. Findings highly suspicious for gastroesophageal reflux. The the right pretracheal lymph node measures 1.4 cm short-axis. There is a low-density nodule right lobe of thyroid gland measures 1.1 cm. Lungs/Pleura: There is bilateral small pleural effusion. Bilateral lower lobe posterior atelectasis or infiltrate right greater than left. Mild interstitial prominence bilateral upper lobes. There is patchy ground-glass attenuation bilateral upper  lobes. Findings highly suspicious for mild pulmonary edema. Less likely upper lobe pneumonia. Upper Abdomen: Visualized upper abdomen shows no adrenal gland mass. There is mild splenomegaly. The spleen measures 14.2 cm in length. Postcholecystectomy surgical clips are noted. Visualized pancreas and liver is unremarkable. Musculoskeletal: No destructive bony lesions are noted. Sagittal images of the spine shows mild degenerative changes mid and lower thoracic spine. Review of the MIP images confirms the above findings. IMPRESSION: 1. No pulmonary embolus is noted. 2. There is mediastinal and hilar adenopathy. Although might be reactive lymphoproliferative disease or metastatic disease cannot be entirely excluded. Follow-up examination in 3 months is recommended to assure stability or resolution. 3. There is debris noted throughout the esophagus. Mild thickening of distal esophageal wall. Gastroesophageal reflux or reflux esophagitis cannot be excluded. Less likely achalasia. 4. There is bilateral small pleural effusion. Bilateral lower lobe atelectasis or infiltrate right greater than left. Mild interstitial prominence and patchy ground-glass attenuation bilaterally suspicious for mild pulmonary edema. Less likely diffuse bilateral pneumonia. 5. Mild degenerative changes thoracic spine. 6. Mild splenomegaly.  The spleen measures 14.2 cm in length. Electronically Signed   By: Lahoma Crocker M.D.   On: 01/24/2017 13:20     CBC  Recent Labs Lab 01/23/17 0904 01/24/17 0405 01/26/17 0800  WBC 7.6 6.9 8.4  HGB 8.9* 8.3* 8.6*  HCT 26.3* 25.3* 25.9*  PLT 407 345 344  MCV 82.8 83.4 82.1  MCH 28.0 27.5 27.4  MCHC 33.8 32.9 33.3  RDW 14.7* 14.5 14.9*    Chemistries   Recent Labs Lab 01/23/17  0258 01/24/17 0319 01/26/17 0800 01/27/17 0507  NA 133* 129* 130* 130*  K 5.0 4.8 5.1 5.2*  CL 90* 86* 82* 82*  CO2 37* 37* 43* 43*  GLUCOSE 98 169* 172* 170*  BUN 28* 25* 26* 30*  CREATININE 1.08* 0.88 1.11*  1.12*  CALCIUM 9.8 9.6 9.6 9.5  AST 15  --   --   --   ALT 9*  --   --   --   ALKPHOS 74  --   --   --   BILITOT 0.9  --   --   --    ------------------------------------------------------------------------------------------------------------------ estimated creatinine clearance is 61.1 mL/min (A) (by C-G formula based on SCr of 1.12 mg/dL (H)). ------------------------------------------------------------------------------------------------------------------ No results for input(s): HGBA1C in the last 72 hours. ------------------------------------------------------------------------------------------------------------------ No results for input(s): CHOL, HDL, LDLCALC, TRIG, CHOLHDL, LDLDIRECT in the last 72 hours. ------------------------------------------------------------------------------------------------------------------ No results for input(s): TSH, T4TOTAL, T3FREE, THYROIDAB in the last 72 hours.  Invalid input(s): FREET3 ------------------------------------------------------------------------------------------------------------------ No results for input(s): VITAMINB12, FOLATE, FERRITIN, TIBC, IRON, RETICCTPCT in the last 72 hours.  Coagulation profile No results for input(s): INR, PROTIME in the last 168 hours.  No results for input(s): DDIMER in the last 72 hours.  Cardiac Enzymes  Recent Labs Lab 01/23/17 0904  TROPONINI <0.03   ------------------------------------------------------------------------------------------------------------------ Invalid input(s): POCBNP    Assessment & Plan   A66 year old female with past medical history of anxiety, depression, hypertension, diabetes, obstructive sleep apnea, chronic respiratory failure, chronic diastolic CHF, history of recurrent UTIs, who presents to the hospital due to shortness of breath and hypoxia.  1. Acute on chronic respiratory failure with hypoxia-secondary to volume overload and mild CHF. Unclear if there  is an anxiety component to this Continued therapy with oral Levaquin for possible pneumonia As low-dose IV Lasix Patient had a CT scan of the chest which failed to show any evidence of pulmonary embolism I try to use CPAP for the patient she refused  2. CHF-acute on chronic diastolic dysfunction.- Last echo on October 5277 with diastolic dysfunction -Continue IV Lasix -Continue metoprolol.  3. Diabetes type 2 without complication-continue Lantus, sliding scale insulin, Actos. Follow blood sugars.  4. History of seizures-continue Keppra, Lamictal.  5. History of depression with catatonia in the past-continue Zyprexa. Some of his respiratory symptoms could be anxiety driven I will ask psychiatry to see  6. History of recurrent UTIs-. Repeat urinalysis  7. GERD-continue Protonix.  8. Hyperlipidemia-continue atorvastatin.  9. Essential hypertension-continue Toprol, Norvasc.       Code Status Orders        Start     Ordered   01/23/17 1320  Full code  Continuous     01/23/17 1320    Code Status History    Date Active Date Inactive Code Status Order ID Comments User Context   10/05/2016 12:16 PM 10/26/2016  5:40 PM DNR [id number]  Knox Royalty, NP Inpatient   09/24/2016  5:31 PM 10/05/2016 12:16 PM Full Code [id number]  Gladstone Lighter, MD Inpatient   09/01/2016  2:17 AM 09/05/2016  4:28 PM Full Code [id number]  Harvie Bridge, DO Inpatient   08/09/2016  8:27 PM 08/12/2016  8:15 PM Full Code [id number]  Henreitta Leber, MD Inpatient   09/30/2015  2:23 PM 10/07/2015  5:37 PM Full Code [id number]  Loletha Grayer, MD ED   09/05/2015  5:56 PM 09/09/2015  5:38 PM DNR [id number]  Loletha Grayer, MD ED    Advance Directive Documentation     Most  Recent Value  Type of Advance Directive  Healthcare Power of Attorney, Living will  Pre-existing out of facility DNR order (yellow form or pink MOST form)  -  "MOST" Form in Place?  -           Consults  none   DVT  Prophylaxis  Lovenox   Lab Results  Component Value Date   PLT 344 01/26/2017     Time Spent in minutes   28min  Greater than 50% of time spent in care coordination and counseling patient regarding the condition and plan of care.   Dustin Flock M.D on 01/28/2017 at 1:39 PM  Between 7am to 6pm - Pager - [phone]  After 6pm go to [email or web address] - password [email or web address] [facility]   Office  [phone]

## 2017-01-28 NOTE — Progress Notes (Signed)
Inpatient Diabetes Program Recommendations  AACE/ADA: New Consensus Statement on Inpatient Glycemic Control (2015)  Target Ranges:  Prepandial:   less than 140 mg/dL      Peak postprandial:   less than 180 mg/dL (1-2 hours)      Critically ill patients:  140 - 180 mg/dL   Lab Results  Component Value Date   GLUCAP 157 (H) 01/28/2017   HGBA1C 6.8 (H) 08/12/2016    Review of Glycemic Control  Results for [name], [name] (MRN [id number]) as of 01/28/2017 11:44  Ref. Range 01/27/2017 07:32 01/27/2017 12:07 01/27/2017 16:50 01/27/2017 20:48 01/28/2017 07:46  Glucose-Capillary Latest Ref Range: 65 - 99 mg/dL 190 (H) 175 (H) 202 (H) 230 (H) 157 (H)    Outpatient Diabetes medications: Lantus 43 units QHS, Novolog 0-20 units TID, Actos 30 mg daily, Novolog 0-5 units qhs  Current orders for Inpatient glycemic control: Lantus 43units QHS, Novolog 0-20 units , Novolog 0-5 units qhs, Actos 30 mg qday  Inpatient Diabetes Program Recommendations: Agree with current medications for blood sugar management.   Gentry Fitz, RN, BA, MHA, CDE Diabetes Coordinator Inpatient Diabetes Program  [phone] (Team Pager) [phone] ([place]) 01/28/2017 11:47 AM

## 2017-01-28 NOTE — Care Management Important Message (Signed)
Important Message  Patient Details  Name: Carolyn Valdez MRN: [id number] Date of Birth: [birth year]   Medicare Important Message Given:  Yes    Beverly Sessions, RN 01/28/2017, 1:51 PM

## 2017-01-28 NOTE — Progress Notes (Signed)
bipap now at bedside for HS.  Patient has declined therapy again.

## 2017-01-28 NOTE — Progress Notes (Signed)
Alerted by cna that patient's saturation was low.  Patient was on a 2 liter nasal cannula. Saturation noted to be 77%.  Increased o2 to 4 liters highest level noted at 83%.  Asked patient if I could put her on cpap. She stated no. RN at bedside. Advised me that order has been changed to bipap at night.  Patient continues to say no to bipap therapy as well.  Placed patient on 40% venturi mask. Saturation increased to 92% Patient did not want to wear this mask either. C/o not getting a snack at this time. Advised patient first we have to get her breathing under control before we give her food. Told her that in order for me to get her saturation up she would have to wear this mask.

## 2017-01-28 NOTE — Consult Note (Signed)
[facility] Consult   Reason for Consult:  Consult for 76 year old woman currently in the hospital with an extended period of respiratory distress Referring Physician:  Posey Pronto Patient Identification: Carolyn Valdez MRN:  [id number] Principal Diagnosis: Adjustment disorder with anxiety Diagnosis:   Patient Active Problem List   Diagnosis Date Noted  . Adjustment disorder with anxiety [F43.22] 01/28/2017  . Acute on chronic respiratory failure with hypoxia ([place]) [J96.21] 01/23/2017  . Palliative care encounter [Z51.5]   . Goals of care, counseling/discussion [Z71.89]   . DNR (do not resuscitate) [Z66] 10/05/2016  . Palliative care by specialist [Z51.5] 10/05/2016  . 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 Severe major depression, single episode, with psychotic features ([place]) [F32.3] 10/04/2016  . Cellulitis of leg, right [L03.115] 09/29/2016  . Proctitis [K62.89] 09/29/2016  . Hypercapnia [R06.89] 09/29/2016  . Acute urinary retention [R33.8] 09/29/2016  . UTI (urinary tract infection) [N39.0] 09/29/2016  . Weakness [R53.1] 09/29/2016  . Subacute delirium [F05] 09/28/2016  . Gastrointestinal hemorrhage [K92.2]   . Diffuse abdominal pain [R10.84]   . Altered mental status [R41.82] 09/24/2016  . Pressure injury of skin [L89.90] 09/02/2016  . Respiratory failure with hypoxia ([place]) [J96.91] 09/01/2016  . Lymphedema [I89.0] 08/16/2016  . CHF (congestive heart failure) ([place]) [I50.9] 08/09/2016  . Acquired lymphedema of leg [I89.0] 04/21/2016  . Congestive heart failure ([place]) [I50.9] 11/25/2015  . Nocturia [R35.1] 11/05/2015  . Urinary frequency [R35.0] 11/05/2015  . Acute respiratory failure with hypoxia ([place]) [J96.01] 09/05/2015  . Recurrent UTI [N39.0] 04/20/2015  . Incontinence [R32] 04/20/2015  . Diabetes mellitus, type 2 ([place]) [E11.9] 04/17/2015  . ESBL (extended spectrum beta-lactamase) producing bacteria infection [A49.9,  Z16.12] 04/17/2015  . BP (high blood pressure) [I10] 04/17/2015  . Frequent UTI [N39.0] 04/17/2015  . Absence of bladder continence [R32] 04/17/2015  . Iron deficiency anemia [D50.9] 03/08/2015  . Absolute anemia [D64.9] 09/25/2014  . Abdominal pain, lower [R10.30] 09/25/2014  . Urge incontinence [N39.41] 02/19/2013  . FOM (frequency of micturition) [R35.0] 02/19/2013  . Bladder infection, chronic [N30.20] 08/11/2012  . Difficult or painful urination [R30.0] 07/26/2012  . Lower urinary tract infection [N39.0] 12/31/2011  . Diabetes mellitus ([place]) [E11.9] 12/31/2011    Total Time spent with patient: 1 hour  Subjective:   Carolyn Valdez is a 76 y.o. female patient admitted with "I'm not feeling good".  HPI:  Patient interviewed. Chart reviewed. Patient known from previous encounters on her last admission. 76 year old woman brought here from [place] with respiratory distress and low oxygen levels. She's been in the hospital for several days now. Consult requested because of concern about anxiety. When I came to see the patient and she looked like she was in distress. She is wearing nasal cannula oxygen and appears to be intermittently to Neck and 2 at times seem like she is breathing hard. Blank but anxious affect about her. She was difficult to interview off and going long spells without being able to answer questions. She was able to tell me that she was having anxiety. Did not say what she was specifically feeling anxious about. She knows that she is in the hospital knows the correct year. Denies having any current suicidal ideation. Not able to give much other history. Review of her medications looks like she still being treated with a modest dose of olanzapine I had prescribed for her last time she was in the hospital.  Social history: Sister is her closest relative. I tried  to reach the sister by telephone and left voice mail messages on both of the numbers. Patient previously  before her last admission here had been living independently but now has been at an assisted living facility ever since then.  Medical history: Patient has multiple medical problems including chronic congestive heart failure  Substance abuse history: No history of alcohol or drug abuse  Past Psychiatric History: Patient was seen by me in January of this year for anxiety and depression. She had a complicated and confusing course psychiatrically. At one point seemed to become delirious and was treated with ECT although it was not clear whether the ECT was going to be helpful. She had a good initial response but then became more withdrawn and even more catatonic thereafter. When she finally recovered we deferred restarting ECT as she was going to be placed. Patient has never been a good historian about this. Her sister has reported she had anxiety and depression problems in the past. No known history of suicide attempts.  Risk to Self: Is patient at risk for suicide?: No Risk to Others:   Prior Inpatient Therapy:   Prior Outpatient Therapy:    Past Medical History:  Past Medical History:  Diagnosis Date  . Anxiety   . Breast cancer ([place])    16 yrs ago  . CHF (congestive heart failure) ([place])   . Diabetes mellitus without complication ([place])   . DJD (degenerative joint disease)   . Dysuria   . H/O total knee replacement    right  . HTN (hypertension)   . Hypercholesteremia   . Kidney stone   . Obesity   . Recurrent urinary tract infection   . SVT (supraventricular tachycardia) ([place])   . Urinary incontinence   . Vaginal atrophy   . Yeast vaginitis     Past Surgical History:  Procedure Laterality Date  . CARDIAC CATHETERIZATION    . CHOLECYSTECTOMY    . FOOT SURGERY    . JOINT REPLACEMENT    . MASTECTOMY Right 1997  . NASAL SEPTUM SURGERY    . PERIPHERAL VASCULAR CATHETERIZATION N/A 07/08/2015   Procedure: PICC Line Insertion;  Surgeon: Algernon Huxley, MD;  Location: [facility];  Service: Cardiovascular;  Laterality: N/A;  . TONSILLECTOMY    . Vocal cords     Family History:  Family History  Problem Relation Age of Onset  . Lung cancer Father   . Hematuria Mother   . Lung cancer Mother   . Kidney disease Neg Hx   . Bladder Cancer Neg Hx   . Breast cancer Neg Hx    Family Psychiatric  History: Not known Social History:  History  Alcohol Use No     History  Drug Use No    Social History   Social History  . Marital status: Widowed    Spouse name: N/A  . Number of children: N/A  . Years of education: N/A   Social History Main Topics  . Smoking status: Former Research scientist (life sciences)  . Smokeless tobacco: Former Systems developer    Quit date: 10/29/1995  . Alcohol use No  . Drug use: No  . Sexual activity: No   Other Topics Concern  . None   Social History Narrative  . None   Additional Social History:    Allergies:   Allergies  Allergen Reactions  . Sulfa Antibiotics Hives  . Biaxin [Clarithromycin] Hives  . Influenza A (H1n1) Monoval Vac Other (See Comments)    Pt  states that she was told by her MD not to get the influenza vaccine.    . Morphine Other (See Comments)    Reaction:  Dizziness and confusion   . Pyridium [Phenazopyridine Hcl] Other (See Comments)    Reaction:  Unknown   . Ceftriaxone Anxiety  . Latex Rash  . Prednisone Rash  . Tape Rash    Labs:  Results for orders placed or performed during the hospital encounter of 01/23/17 (from the past 48 hour(s))  Glucose, capillary     Status: Abnormal   Collection Time: 01/26/17  4:32 PM  Result Value Ref Range   Glucose-Capillary 190 (H) 65 - 99 mg/dL   Comment 1 Notify RN   Glucose, capillary     Status: Abnormal   Collection Time: 01/26/17  9:45 PM  Result Value Ref Range   Glucose-Capillary 165 (H) 65 - 99 mg/dL  Basic metabolic panel     Status: Abnormal   Collection Time: 01/27/17  5:07 AM  Result Value Ref Range   Sodium 130 (L) 135 - 145 mmol/L   Potassium 5.2 (H) 3.5 - 5.1 mmol/L    Chloride 82 (L) 101 - 111 mmol/L   CO2 43 (H) 22 - 32 mmol/L   Glucose, Bld 170 (H) 65 - 99 mg/dL   BUN 30 (H) 6 - 20 mg/dL   Creatinine, Ser 1.12 (H) 0.44 - 1.00 mg/dL   Calcium 9.5 8.9 - 10.3 mg/dL   GFR calc non Af Amer 47 (L) >60 mL/min   GFR calc Af Amer 55 (L) >60 mL/min    Comment: (NOTE) The eGFR has been calculated using the CKD EPI equation. This calculation has not been validated in all clinical situations. eGFR's persistently <60 mL/min signify possible Chronic Kidney Disease.    Anion gap 5 5 - 15  Glucose, capillary     Status: Abnormal   Collection Time: 01/27/17  7:32 AM  Result Value Ref Range   Glucose-Capillary 190 (H) 65 - 99 mg/dL  Glucose, capillary     Status: Abnormal   Collection Time: 01/27/17 12:07 PM  Result Value Ref Range   Glucose-Capillary 175 (H) 65 - 99 mg/dL  Glucose, capillary     Status: Abnormal   Collection Time: 01/27/17  4:50 PM  Result Value Ref Range   Glucose-Capillary 202 (H) 65 - 99 mg/dL  Glucose, capillary     Status: Abnormal   Collection Time: 01/27/17  8:48 PM  Result Value Ref Range   Glucose-Capillary 230 (H) 65 - 99 mg/dL   Comment 1 Notify RN   Glucose, capillary     Status: Abnormal   Collection Time: 01/28/17  7:46 AM  Result Value Ref Range   Glucose-Capillary 157 (H) 65 - 99 mg/dL  Glucose, capillary     Status: Abnormal   Collection Time: 01/28/17 11:56 AM  Result Value Ref Range   Glucose-Capillary 215 (H) 65 - 99 mg/dL  Blood gas, arterial     Status: Abnormal (Preliminary result)   Collection Time: 01/28/17  1:24 PM  Result Value Ref Range   FIO2 PENDING    pH, Arterial 7.39 7.350 - 7.450   pCO2 arterial 84 (HH) 32.0 - 48.0 mmHg    Comment: CRITICAL RESULT CALLED TO, READ BACK BY AND VERIFIED WITH:  DR. SH PATEL 1400 01/28/17 bp    pO2, Arterial 107 83.0 - 108.0 mmHg   Bicarbonate 50.8 (H) 20.0 - 28.0 mmol/L   Acid-Base Excess 23.0 (H) 0.0 -  2.0 mmol/L   O2 Saturation 98.1 %   Patient temperature  37.0    Collection site PENDING    Sample type PENDING    Allens test (pass/fail) PENDING PASS   Mechanical Rate PENDING   Troponin I     Status: None   Collection Time: 01/28/17  2:03 PM  Result Value Ref Range   Troponin I <0.03 <0.03 ng/mL    Current Facility-Administered Medications  Medication Dose Route Frequency Provider Last Rate Last Dose  . acetaminophen (TYLENOL) tablet 650 mg  650 mg Oral Q6H PRN Henreitta Leber, MD   650 mg at 01/28/17 1455   Or  . acetaminophen (TYLENOL) suppository 650 mg  650 mg Rectal Q6H PRN Henreitta Leber, MD      . amLODipine (NORVASC) tablet 2.5 mg  2.5 mg Oral Daily Henreitta Leber, MD   2.5 mg at 01/28/17 1032  . atorvastatin (LIPITOR) tablet 20 mg  20 mg Oral Daily Henreitta Leber, MD   20 mg at 01/28/17 1030  . benzonatate (TESSALON) capsule 100 mg  100 mg Oral TID PRN Henreitta Leber, MD      . clonazepam Bobbye [place]) disintegrating tablet 0.25 mg  0.25 mg Oral TID Gonzella Lex, MD      . desmopressin (DDAVP) tablet 0.2 mg  0.2 mg Oral QHS Dustin Flock, MD   0.2 mg at 01/27/17 2202  . docusate sodium (COLACE) capsule 100 mg  100 mg Oral BID Henreitta Leber, MD   100 mg at 01/28/17 1030  . enoxaparin (LOVENOX) injection 40 mg  40 mg Subcutaneous Q12H Dustin Flock, MD   40 mg at 01/28/17 1030  . feeding supplement (ENSURE ENLIVE) (ENSURE ENLIVE) liquid 237 mL  237 mL Oral TID BM Henreitta Leber, MD   237 mL at 01/28/17 1500  . fluticasone (FLONASE) 50 MCG/ACT nasal spray 2 spray  2 spray Each Nare Daily PRN Dustin Flock, MD      . Derrill Memo ON 01/29/2017] furosemide (LASIX) injection 40 mg  40 mg Intravenous Q12H Dustin Flock, MD      . guaiFENesin (MUCINEX) 12 hr tablet 600 mg  600 mg Oral BID Dustin Flock, MD   600 mg at 01/28/17 1029  . hydrOXYzine (ATARAX/VISTARIL) tablet 25 mg  25 mg Oral QHS Henreitta Leber, MD   25 mg at 01/27/17 2203  . insulin aspart (novoLOG) injection 0-20 Units  0-20 Units Subcutaneous TID WC Henreitta Leber, MD    7 Units at 01/28/17 1216  . insulin aspart (novoLOG) injection 0-5 Units  0-5 Units Subcutaneous QHS Henreitta Leber, MD   2 Units at 01/27/17 2201  . insulin glargine (LANTUS) injection 43 Units  43 Units Subcutaneous QHS Henreitta Leber, MD   43 Units at 01/27/17 2202  . iron polysaccharides (NIFEREX) capsule 150 mg  150 mg Oral TID Henreitta Leber, MD   150 mg at 01/28/17 1521  . lamoTRIgine (LAMICTAL) tablet 25 mg  25 mg Oral Daily Henreitta Leber, MD   25 mg at 01/28/17 1030  . levETIRAcetam (KEPPRA) tablet 250 mg  250 mg Oral BID Henreitta Leber, MD   250 mg at 01/28/17 1031  . levofloxacin (LEVAQUIN) tablet 500 mg  500 mg Oral Daily Napoleon Form, RPH   500 mg at 01/28/17 1030  . loratadine (CLARITIN) tablet 10 mg  10 mg Oral Daily Henreitta Leber, MD   10 mg at 01/28/17 1030  .  LORazepam (ATIVAN) tablet 1 mg  1 mg Oral Q6H PRN Harrie Foreman, MD   1 mg at 01/28/17 1521  . MEDLINE mouth rinse  15 mL Mouth Rinse BID Henreitta Leber, MD   15 mL at 01/28/17 1037  . metoCLOPramide (REGLAN) tablet 5 mg  5 mg Oral Q8H PRN Henreitta Leber, MD   5 mg at 01/25/17 0839  . metoprolol (LOPRESSOR) tablet 50 mg  50 mg Oral BID Henreitta Leber, MD   50 mg at 01/28/17 1030  . nystatin (MYCOSTATIN/NYSTOP) topical powder   Topical TID Henreitta Leber, MD      . OLANZapine zydis (ZYPREXA) disintegrating tablet 10 mg  10 mg Oral QHS Henreitta Leber, MD   10 mg at 01/27/17 2203  . ondansetron (ZOFRAN) tablet 4 mg  4 mg Oral Q6H PRN Henreitta Leber, MD   4 mg at 01/26/17 2215   Or  . ondansetron (ZOFRAN) injection 4 mg  4 mg Intravenous Q6H PRN Henreitta Leber, MD   4 mg at 01/24/17 1751  . oxybutynin (DITROPAN-XL) 24 hr tablet 10 mg  10 mg Oral Daily Henreitta Leber, MD   10 mg at 01/28/17 1029  . pantoprazole (PROTONIX) EC tablet 40 mg  40 mg Oral BID AC Dustin Flock, MD   40 mg at 01/28/17 0757  . pioglitazone (ACTOS) tablet 30 mg  30 mg Oral Daily Henreitta Leber, MD   30 mg at 01/28/17 1030  .  polyethylene glycol (MIRALAX / GLYCOLAX) packet 17 g  17 g Oral Daily Henreitta Leber, MD   17 g at 01/28/17 1029  . senna (SENOKOT) tablet 8.6 mg  1 tablet Oral Daily Henreitta Leber, MD   8.6 mg at 01/28/17 1030  . sodium chloride flush (NS) 0.9 % injection 3 mL  3 mL Intravenous Q12H Henreitta Leber, MD   3 mL at 01/28/17 1036  . vitamin E capsule 1,000 Units  1,000 Units Oral Daily Henreitta Leber, MD   1,000 Units at 01/28/17 1029   Facility-Administered Medications Ordered in Other Encounters  Medication Dose Route Frequency Provider Last Rate Last Dose  . 0.9 %  sodium chloride infusion    Continuous PRN Dionne Bucy, CRNA        Musculoskeletal: Strength & Muscle Tone: decreased Gait & Station: unable to stand Patient leans: N/A  Psychiatric Specialty Exam: Physical Exam  Nursing note and vitals reviewed. Constitutional: She appears well-developed and well-nourished. She appears distressed.  HENT:  Head: Normocephalic and atraumatic.  Eyes: Conjunctivae are normal. Pupils are equal, round, and reactive to light.  Neck: Normal range of motion.  Cardiovascular: Regular rhythm and normal heart sounds.   Respiratory: Tachypnea noted. She is in respiratory distress.  GI: Soft.  Musculoskeletal: Normal range of motion.  Neurological: She is alert.  Skin: Skin is warm and dry.  Psychiatric: Her mood appears anxious. Her speech is delayed. She is slowed. Thought content is not paranoid. She expresses no suicidal ideation. She exhibits abnormal recent memory.    Review of Systems  Constitutional: Positive for malaise/fatigue.  HENT: Negative.   Eyes: Negative.   Respiratory: Positive for shortness of breath.   Cardiovascular: Negative.   Gastrointestinal: Positive for nausea.  Musculoskeletal: Negative.   Skin: Negative.   Neurological: Negative.   Psychiatric/Behavioral: Negative for depression, hallucinations, substance abuse and suicidal ideas. The patient is  nervous/anxious and has insomnia.     Blood pressure Marland Kitchen)  145/52, pulse 88, temperature 98.2 F (36.8 C), temperature source Oral, resp. rate (!) 28, height 5' 7"  (1.702 m), weight 127 kg (280 lb), SpO2 98 %.Body mass index is 43.85 kg/m.  General Appearance: Casual  Eye Contact:  Fair  Speech:  Garbled and Slow  Volume:  Decreased  Mood:  Anxious  Affect:  Blunt and Congruent  Thought Process:  Disorganized  Orientation:  Full (Time, Place, and Person)  Thought Content:  Illogical and Tangential  Suicidal Thoughts:  No  Homicidal Thoughts:  No  Memory:  Immediate;   Fair Recent;   Fair Remote;   Fair  Judgement:  Fair  Insight:  Fair  Psychomotor Activity:  Decreased  Concentration:  Concentration: Poor  Recall:  [employer] of Knowledge:  Fair  Language:  Fair  Akathisia:  No  Handed:  Right  AIMS (if indicated):     Assets:  Desire for Improvement Housing Social Support  ADL's:  Impaired  Cognition:  Impaired,  Mild and Moderate  Sleep:        Treatment Plan Summary: Daily contact with patient to assess and evaluate symptoms and progress in treatment, Medication management and Plan This is a 76 year old woman with a history of anxiety and depression who has been in the hospital for several days being treated for her respiratory problems. Concern seems to be mounting about anxiety being related to her respiratory problems. She certainly seemed to be panicky and nervous today. I am going to put her on clonazepam a quarter milligram 3 times a day which is a modest dose but should be enough to calm down some degree of the anxiety. I will sign this out over the weekend and see her if she is still in the hospital on Monday.  Disposition: Patient does not meet criteria for psychiatric inpatient admission. Supportive therapy provided about ongoing stressors.  Alethia Berthold, MD 01/28/2017 4:09 PM

## 2017-01-28 NOTE — Progress Notes (Addendum)
Pt SAT's in the low 70's to 80's on 4 L O2. RT assessed and placed Pt on ventri mask. Pt anxious about wearing and noted that will not wear. RT and RN  educated Pt on the need to keep the O2 mask on. MD notified of situation and cont. pulds ox ordered. Pt wearing mask whilst RN in room. Pt will be monitor closely.

## 2017-01-28 NOTE — Progress Notes (Signed)
Advanced care plan.  Purpose of the Encounter: CODE STATUS  Parties in Attendance: Patient   Patient's Decision Capacity:intact  Subjective/Patient's story:  Pt with chf and sleep apnea, with progressive sob  Objective/Medical story D/w patient regarding previous dnr status, she states she wants everyhthing done And wishes to be full code   Goals of care determination:   Full code aggressive thearpy CODE STATUS: full code   Time spent discussing advanced care planning: 15 minutes

## 2017-01-28 NOTE — Progress Notes (Signed)
Patient has been weaned to 30% via venturi mask. Saturation noted at 94%. Tolerating well

## 2017-01-29 LAB — GLUCOSE, CAPILLARY
GLUCOSE-CAPILLARY: 116 mg/dL — AB (ref 65–99)
Glucose-Capillary: 200 mg/dL — ABNORMAL HIGH (ref 65–99)
Glucose-Capillary: 135 mg/dL — ABNORMAL HIGH (ref 65–99)
Glucose-Capillary: 183 mg/dL — ABNORMAL HIGH (ref 65–99)
Glucose-Capillary: 175 mg/dL — ABNORMAL HIGH (ref 65–99)

## 2017-01-29 LAB — BASIC METABOLIC PANEL
Anion gap: 6 (ref 5–15)
BUN: 36 mg/dL — AB (ref 6–20)
CHLORIDE: 79 mmol/L — AB (ref 101–111)
CO2: 42 mmol/L — AB (ref 22–32)
Calcium: 9.4 mg/dL (ref 8.9–10.3)
Creatinine, Ser: 1.13 mg/dL — ABNORMAL HIGH (ref 0.44–1.00)
GFR calc Af Amer: 54 mL/min — ABNORMAL LOW (ref >60)
GFR calc non Af Amer: 47 mL/min — ABNORMAL LOW (ref >60)
GLUCOSE: 161 mg/dL — AB (ref 65–99)
POTASSIUM: 5.4 mmol/L — AB (ref 3.5–5.1)
Sodium: 127 mmol/L — ABNORMAL LOW (ref 135–145)

## 2017-01-29 LAB — TROPONIN I: Troponin I: <0.03 ng/mL (ref <0.03)

## 2017-01-29 MED ORDER — BUDESONIDE 0.5 MG/2ML IN SUSP
0.5000 mg | Freq: Two times a day (BID) | RESPIRATORY_TRACT | Status: DC
  Administered 2017-01-29 – 2017-02-04 (×12): 0.5 mg via RESPIRATORY_TRACT
  Filled 2017-01-29 (×13): qty 2

## 2017-01-29 MED ORDER — PATIROMER SORBITEX CALCIUM 8.4 G PO PACK
16.8000 g | PACK | Freq: Once | ORAL | Status: DC
  Filled 2017-01-29: qty 4

## 2017-01-29 MED ORDER — CLONAZEPAM 0.125 MG PO TBDP
0.2500 mg | ORAL_TABLET | Freq: Two times a day (BID) | ORAL | Status: DC
Start: 2017-01-29 — End: 2017-01-29

## 2017-01-29 MED ORDER — FUROSEMIDE 10 MG/ML IJ SOLN
60.0000 mg | Freq: Two times a day (BID) | INTRAMUSCULAR | Status: DC
  Administered 2017-01-29 – 2017-02-01 (×6): 60 mg via INTRAVENOUS
  Filled 2017-01-29 (×2): qty 6
  Filled 2017-01-29: qty 8
  Filled 2017-01-29: qty 6
  Filled 2017-01-29 (×2): qty 8
  Filled 2017-01-29: qty 6

## 2017-01-29 MED ORDER — SODIUM POLYSTYRENE SULFONATE 15 GM/60ML PO SUSP
30.0000 g | Freq: Once | ORAL | Status: AC
  Administered 2017-01-29: 30 g via ORAL
  Filled 2017-01-29: qty 120

## 2017-01-29 MED ORDER — IPRATROPIUM-ALBUTEROL 0.5-2.5 (3) MG/3ML IN SOLN
3.0000 mL | Freq: Four times a day (QID) | RESPIRATORY_TRACT | Status: DC
  Administered 2017-01-29 – 2017-01-30 (×5): 3 mL via RESPIRATORY_TRACT
  Filled 2017-01-29 (×6): qty 3

## 2017-01-29 MED ORDER — LORAZEPAM 0.5 MG PO TABS
0.5000 mg | ORAL_TABLET | Freq: Two times a day (BID) | ORAL | Status: DC
  Administered 2017-01-30 – 2017-02-04 (×7): 0.5 mg via ORAL
  Filled 2017-01-29 (×8): qty 1

## 2017-01-29 MED ORDER — CITALOPRAM HYDROBROMIDE 10 MG PO TABS
10.0000 mg | ORAL_TABLET | Freq: Every day | ORAL | Status: DC
  Administered 2017-01-29 – 2017-02-02 (×4): 10 mg via ORAL
  Filled 2017-01-29 (×6): qty 1

## 2017-01-29 NOTE — Progress Notes (Signed)
Had an issue throughout night with Pt taking off oxygen, several members of staff and RT in room to replace and encourage Pt to keep mask on.  Pt slapped staff member's hand away and refused to put mask back on. RN encouraged Pt to put mask on. Pt SAT's at 92-93 on 40% with shallow labored breaths. MD notified of the situation.

## 2017-01-29 NOTE — Consult Note (Signed)
[facility] Face-to-Face Psychiatry Consult   Reason for Consult:  Anxiety Referring Physician:  Cordelia Poche, MD Patient Identification: Carolyn Valdez MRN:  [id number] Principal Diagnosis: Adjustment disorder with anxiety Diagnosis:   Patient Active Problem List   Diagnosis Date Noted  . Adjustment disorder with anxiety [F43.22] 01/28/2017  . Acute on chronic respiratory failure with hypoxia ([place]) [J96.21] 01/23/2017  . Palliative care encounter [Z51.5]   . Goals of care, counseling/discussion [Z71.89]   . DNR (do not resuscitate) [Z66] 10/05/2016  . Palliative care by specialist [Z51.5] 10/05/2016  . 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 Severe major depression, single episode, with psychotic features ([place]) [F32.3] 10/04/2016  . Cellulitis of leg, right [L03.115] 09/29/2016  . Proctitis [K62.89] 09/29/2016  . Hypercapnia [R06.89] 09/29/2016  . Acute urinary retention [R33.8] 09/29/2016  . UTI (urinary tract infection) [N39.0] 09/29/2016  . Weakness [R53.1] 09/29/2016  . Subacute delirium [F05] 09/28/2016  . Gastrointestinal hemorrhage [K92.2]   . Diffuse abdominal pain [R10.84]   . Altered mental status [R41.82] 09/24/2016  . Pressure injury of skin [L89.90] 09/02/2016  . Respiratory failure with hypoxia ([place]) [J96.91] 09/01/2016  . Lymphedema [I89.0] 08/16/2016  . CHF (congestive heart failure) ([place]) [I50.9] 08/09/2016  . Acquired lymphedema of leg [I89.0] 04/21/2016  . Congestive heart failure ([place]) [I50.9] 11/25/2015  . Nocturia [R35.1] 11/05/2015  . Urinary frequency [R35.0] 11/05/2015  . Acute respiratory failure with hypoxia ([place]) [J96.01] 09/05/2015  . Recurrent UTI [N39.0] 04/20/2015  . Incontinence [R32] 04/20/2015  . Diabetes mellitus, type 2 ([place]) [E11.9] 04/17/2015  . ESBL (extended spectrum beta-lactamase) producing bacteria infection [A49.9, Z16.12] 04/17/2015  . BP (high blood pressure) [I10] 04/17/2015  . Frequent UTI  [N39.0] 04/17/2015  . Absence of bladder continence [R32] 04/17/2015  . Iron deficiency anemia [D50.9] 03/08/2015  . Absolute anemia [D64.9] 09/25/2014  . Abdominal pain, lower [R10.30] 09/25/2014  . Urge incontinence [N39.41] 02/19/2013  . FOM (frequency of micturition) [R35.0] 02/19/2013  . Bladder infection, chronic [N30.20] 08/11/2012  . Difficult or painful urination [R30.0] 07/26/2012  . Lower urinary tract infection [N39.0] 12/31/2011  . Diabetes mellitus ([place]) [E11.9] 12/31/2011    Total Time spent with patient: 20 minutes  Subjective:   The patient was alert but mildly disoriented today. She had a difficult time with the year but knew the month. The patient did report problems with anxiety but had a difficult time verbalizing triggers for anxiety. He says she has struggled with depression and anxiety for years. She is very vague with her responses. She also acknowledged having panic-like symptoms today. He could not remember any SSRIs that she had been on in the past. She denied any current active or passive suicidal thoughts or psychotic symptoms.  Social history: Sister is her closest relative. I tried to reach the sister by telephone and left voice mail messages on both of the numbers. Patient previously before her last admission here had been living independently but now has been at an assisted living facility ever since then.  Medical history: Patient has multiple medical problems including chronic congestive heart failure  Substance abuse history: No history of alcohol or drug abuse  Past Psychiatric History: Patient was seen by me in January of this year for anxiety and depression. She had a complicated and confusing course psychiatrically. At one point seemed to become delirious and was treated with ECT although it was not clear whether the ECT was going to be helpful. She had a good initial response  but then became more withdrawn and even more catatonic thereafter. When  she finally recovered we deferred restarting ECT as she was going to be placed. Patient has never been a good historian about this. Her sister has reported she had anxiety and depression problems in the past. No known history of suicide attempts.    Risk to Self: Is patient at risk for suicide?: No Risk to Others:  No Prior Inpatient Therapy:  Yes Prior Outpatient Therapy:  Yes  Past Medical History:  Past Medical History:  Diagnosis Date  . Anxiety   . Breast cancer ([place])    16 yrs ago  . CHF (congestive heart failure) ([place])   . Diabetes mellitus without complication ([place])   . DJD (degenerative joint disease)   . Dysuria   . H/O total knee replacement    right  . HTN (hypertension)   . Hypercholesteremia   . Kidney stone   . Obesity   . Recurrent urinary tract infection   . SVT (supraventricular tachycardia) ([place])   . Urinary incontinence   . Vaginal atrophy   . Yeast vaginitis     Past Surgical History:  Procedure Laterality Date  . CARDIAC CATHETERIZATION    . CHOLECYSTECTOMY    . FOOT SURGERY    . JOINT REPLACEMENT    . MASTECTOMY Right 1997  . NASAL SEPTUM SURGERY    . PERIPHERAL VASCULAR CATHETERIZATION N/A 07/08/2015   Procedure: PICC Line Insertion;  Surgeon: Algernon Huxley, MD;  Location: [facility];  Service: Cardiovascular;  Laterality: N/A;  . TONSILLECTOMY    . Vocal cords     Family History:  Family History  Problem Relation Age of Onset  . Lung cancer Father   . Hematuria Mother   . Lung cancer Mother   . Kidney disease Neg Hx   . Bladder Cancer Neg Hx   . Breast cancer Neg Hx     Social History:  History  Alcohol Use No     History  Drug Use No    Social History   Social History  . Marital status: Widowed    Spouse name: N/A  . Number of children: N/A  . Years of education: N/A   Social History Main Topics  . Smoking status: Former Research scientist (life sciences)  . Smokeless tobacco: Former Systems developer    Quit date: 10/29/1995  . Alcohol use No  . Drug  use: No  . Sexual activity: No   Other Topics Concern  . None   Social History Narrative  . None   Additional Social History:    Allergies:   Allergies  Allergen Reactions  . Sulfa Antibiotics Hives  . Biaxin [Clarithromycin] Hives  . Influenza A (H1n1) Monoval Vac Other (See Comments)    Pt states that she was told by her MD not to get the influenza vaccine.    . Morphine Other (See Comments)    Reaction:  Dizziness and confusion   . Pyridium [Phenazopyridine Hcl] Other (See Comments)    Reaction:  Unknown   . Ceftriaxone Anxiety  . Latex Rash  . Prednisone Rash  . Tape Rash    Labs:  Results for orders placed or performed during the hospital encounter of 01/23/17 (from the past 48 hour(s))  Glucose, capillary     Status: Abnormal   Collection Time: 01/27/17  8:48 PM  Result Value Ref Range   Glucose-Capillary 230 (H) 65 - 99 mg/dL   Comment 1 Notify RN  Glucose, capillary     Status: Abnormal   Collection Time: 01/28/17  7:46 AM  Result Value Ref Range   Glucose-Capillary 157 (H) 65 - 99 mg/dL  Glucose, capillary     Status: Abnormal   Collection Time: 01/28/17 11:56 AM  Result Value Ref Range   Glucose-Capillary 215 (H) 65 - 99 mg/dL  Blood gas, arterial     Status: Abnormal (Preliminary result)   Collection Time: 01/28/17  1:24 PM  Result Value Ref Range   FIO2 PENDING    pH, Arterial 7.39 7.350 - 7.450   pCO2 arterial 84 (HH) 32.0 - 48.0 mmHg    Comment: CRITICAL RESULT CALLED TO, READ BACK BY AND VERIFIED WITH:  DR. SH PATEL 1400 01/28/17 bp    pO2, Arterial 107 83.0 - 108.0 mmHg   Bicarbonate 50.8 (H) 20.0 - 28.0 mmol/L   Acid-Base Excess 23.0 (H) 0.0 - 2.0 mmol/L   O2 Saturation 98.1 %   Patient temperature 37.0    Collection site PENDING    Sample type PENDING    Allens test (pass/fail) PENDING PASS   Mechanical Rate PENDING   Troponin I     Status: None   Collection Time: 01/28/17  2:03 PM  Result Value Ref Range   Troponin I <0.03 <0.03  ng/mL  Glucose, capillary     Status: Abnormal   Collection Time: 01/28/17  4:38 PM  Result Value Ref Range   Glucose-Capillary 213 (H) 65 - 99 mg/dL  Glucose, capillary     Status: Abnormal   Collection Time: 01/28/17  9:05 PM  Result Value Ref Range   Glucose-Capillary 221 (H) 65 - 99 mg/dL  Troponin I     Status: None   Collection Time: 01/28/17  9:09 PM  Result Value Ref Range   Troponin I <0.03 <0.03 ng/mL  Glucose, capillary     Status: Abnormal   Collection Time: 01/28/17 11:45 PM  Result Value Ref Range   Glucose-Capillary 200 (H) 65 - 99 mg/dL  Basic metabolic panel     Status: Abnormal   Collection Time: 01/29/17  5:26 AM  Result Value Ref Range   Sodium 127 (L) 135 - 145 mmol/L   Potassium 5.4 (H) 3.5 - 5.1 mmol/L   Chloride 79 (L) 101 - 111 mmol/L   CO2 42 (H) 22 - 32 mmol/L   Glucose, Bld 161 (H) 65 - 99 mg/dL   BUN 36 (H) 6 - 20 mg/dL   Creatinine, Ser 1.13 (H) 0.44 - 1.00 mg/dL   Calcium 9.4 8.9 - 10.3 mg/dL   GFR calc non Af Amer 47 (L) >60 mL/min   GFR calc Af Amer 54 (L) >60 mL/min    Comment: (NOTE) The eGFR has been calculated using the CKD EPI equation. This calculation has not been validated in all clinical situations. eGFR's persistently <60 mL/min signify possible Chronic Kidney Disease.    Anion gap 6 5 - 15  Troponin I     Status: None   Collection Time: 01/29/17  5:26 AM  Result Value Ref Range   Troponin I <0.03 <0.03 ng/mL  Glucose, capillary     Status: Abnormal   Collection Time: 01/29/17  7:47 AM  Result Value Ref Range   Glucose-Capillary 135 (H) 65 - 99 mg/dL   Comment 1 Notify RN   Glucose, capillary     Status: Abnormal   Collection Time: 01/29/17 11:30 AM  Result Value Ref Range   Glucose-Capillary 116 (H) 65 -  99 mg/dL  Glucose, capillary     Status: Abnormal   Collection Time: 01/29/17  4:27 PM  Result Value Ref Range   Glucose-Capillary 183 (H) 65 - 99 mg/dL   Comment 1 Notify RN     Current Facility-Administered  Medications  Medication Dose Route Frequency Provider Last Rate Last Dose  . acetaminophen (TYLENOL) tablet 650 mg  650 mg Oral Q6H PRN Henreitta Leber, MD   650 mg at 01/29/17 1535   Or  . acetaminophen (TYLENOL) suppository 650 mg  650 mg Rectal Q6H PRN Henreitta Leber, MD      . amLODipine (NORVASC) tablet 2.5 mg  2.5 mg Oral Daily Henreitta Leber, MD   2.5 mg at 01/29/17 0839  . atorvastatin (LIPITOR) tablet 20 mg  20 mg Oral Daily Henreitta Leber, MD   20 mg at 01/29/17 0841  . benzonatate (TESSALON) capsule 100 mg  100 mg Oral TID PRN Henreitta Leber, MD      . budesonide (PULMICORT) nebulizer solution 0.5 mg  0.5 mg Nebulization BID Loletha Grayer, MD   0.5 mg at 01/29/17 1927  . citalopram (CELEXA) tablet 10 mg  10 mg Oral Daily Chauncey Mann, MD      . clonazepam Bobbye [place]) disintegrating tablet 0.25 mg  0.25 mg Oral BID Chauncey Mann, MD      . desmopressin (DDAVP) tablet 0.2 mg  0.2 mg Oral QHS Dustin Flock, MD   0.2 mg at 01/28/17 2351  . docusate sodium (COLACE) capsule 100 mg  100 mg Oral BID Henreitta Leber, MD   100 mg at 01/29/17 0841  . enoxaparin (LOVENOX) injection 40 mg  40 mg Subcutaneous Q12H Dustin Flock, MD   40 mg at 01/29/17 0837  . feeding supplement (ENSURE ENLIVE) (ENSURE ENLIVE) liquid 237 mL  237 mL Oral TID BM Henreitta Leber, MD   237 mL at 01/29/17 1408  . fluticasone (FLONASE) 50 MCG/ACT nasal spray 2 spray  2 spray Each Nare Daily PRN Dustin Flock, MD      . furosemide (LASIX) injection 60 mg  60 mg Intravenous BID Loletha Grayer, MD   60 mg at 01/29/17 1722  . guaiFENesin (MUCINEX) 12 hr tablet 600 mg  600 mg Oral BID Dustin Flock, MD   600 mg at 01/29/17 0841  . hydrOXYzine (ATARAX/VISTARIL) tablet 25 mg  25 mg Oral QHS Henreitta Leber, MD   25 mg at 01/28/17 2351  . insulin aspart (novoLOG) injection 0-20 Units  0-20 Units Subcutaneous TID WC Henreitta Leber, MD   4 Units at 01/29/17 1722  . insulin aspart (novoLOG) injection 0-5 Units  0-5 Units  Subcutaneous QHS Henreitta Leber, MD   2 Units at 01/27/17 2201  . insulin glargine (LANTUS) injection 43 Units  43 Units Subcutaneous QHS Henreitta Leber, MD   43 Units at 01/28/17 2346  . ipratropium-albuterol (DUONEB) 0.5-2.5 (3) MG/3ML nebulizer solution 3 mL  3 mL Nebulization Q6H Loletha Grayer, MD   3 mL at 01/29/17 1927  . iron polysaccharides (NIFEREX) capsule 150 mg  150 mg Oral TID Henreitta Leber, MD   150 mg at 01/29/17 1535  . lamoTRIgine (LAMICTAL) tablet 25 mg  25 mg Oral Daily Henreitta Leber, MD   25 mg at 01/29/17 3570  . levETIRAcetam (KEPPRA) tablet 250 mg  250 mg Oral BID Henreitta Leber, MD   250 mg at 01/29/17 0846  . levofloxacin (LEVAQUIN) tablet  500 mg  500 mg Oral Daily Napoleon Form, RPH   500 mg at 01/29/17 1135  . loratadine (CLARITIN) tablet 10 mg  10 mg Oral Daily Henreitta Leber, MD   10 mg at 01/29/17 0851  . LORazepam (ATIVAN) tablet 0.5 mg  0.5 mg Oral BID Chauncey Mann, MD      . MEDLINE mouth rinse  15 mL Mouth Rinse BID Henreitta Leber, MD   15 mL at 01/28/17 2348  . metoCLOPramide (REGLAN) tablet 5 mg  5 mg Oral Q8H PRN Henreitta Leber, MD   5 mg at 01/28/17 1700  . metoprolol (LOPRESSOR) tablet 50 mg  50 mg Oral BID Henreitta Leber, MD   50 mg at 01/29/17 0845  . nystatin (MYCOSTATIN/NYSTOP) topical powder   Topical TID Henreitta Leber, MD      . OLANZapine zydis (ZYPREXA) disintegrating tablet 10 mg  10 mg Oral QHS Henreitta Leber, MD   10 mg at 01/28/17 2350  . ondansetron (ZOFRAN) tablet 4 mg  4 mg Oral Q6H PRN Henreitta Leber, MD   4 mg at 01/26/17 2215   Or  . ondansetron (ZOFRAN) injection 4 mg  4 mg Intravenous Q6H PRN Henreitta Leber, MD   4 mg at 01/24/17 1751  . oxybutynin (DITROPAN-XL) 24 hr tablet 10 mg  10 mg Oral Daily Henreitta Leber, MD   10 mg at 01/29/17 0844  . pantoprazole (PROTONIX) EC tablet 40 mg  40 mg Oral BID AC Dustin Flock, MD   40 mg at 01/29/17 1722  . polyethylene glycol (MIRALAX / GLYCOLAX) packet 17 g  17 g Oral Daily  Henreitta Leber, MD   17 g at 01/28/17 1029  . senna (SENOKOT) tablet 8.6 mg  1 tablet Oral Daily Henreitta Leber, MD   8.6 mg at 01/29/17 0845  . sodium chloride flush (NS) 0.9 % injection 3 mL  3 mL Intravenous Q12H Henreitta Leber, MD   3 mL at 01/29/17 0848  . vitamin E capsule 1,000 Units  1,000 Units Oral Daily Henreitta Leber, MD   1,000 Units at 01/29/17 4696   Facility-Administered Medications Ordered in Other Encounters  Medication Dose Route Frequency Provider Last Rate Last Dose  . 0.9 %  sodium chloride infusion    Continuous PRN Dionne Bucy, CRNA        Musculoskeletal: Strength & Muscle Tone: decreased Gait & Station: Unable to assess Patient leans: Unable to assess  Psychiatric Specialty Exam: Physical Exam  Review of Systems  Unable to perform ROS: Psychiatric disorder    Blood pressure 99/76, pulse 64, temperature 98.3 F (36.8 C), temperature source Oral, resp. rate 19, height 5' 7"  (1.702 m), weight 127 kg (280 lb), SpO2 94 %.Body mass index is 43.85 kg/m.  General Appearance: Casual  Eye Contact:  Fair  Speech:  Slow  Volume:  Decreased  Mood:  Depressed  Affect:  Depressed  Thought Process: Brief responses  Orientation:  Other:  Oreinted to place and situation but had difficulty with time  Thought Content:  Logical  Suicidal Thoughts:  No  Homicidal Thoughts:  No  Memory:  Immediate;   Fair Recent;   Fair Remote;   Fair  Judgement:  Impaired  Insight:  Lacking  Psychomotor Activity:  Decreased  Concentration:  Concentration: Poor and Attention Span: Poor  Recall:  [employer] of Knowledge:  Fair  Language:  Fair  Akathisia:  No  Handed:  Right  AIMS (if indicated):     Assets:  Financial Resources/Insurance  ADL's:  Impaired  Cognition:  Impaired,  Severe  Sleep:        Treatment Plan Summary:  Ms. Buckels is a 76 year old Caucasian female with a prior diagnosis of mood disorder and ECT Treatment in the past. She is currently experiencing  problems with anxiety, panic-like symptoms and depression. She denies any current active or passive suicidal thoughts or psychosis. The patient is mildly disoriented and may be mildly delirious as well.  Mood disorder, NOS with history of Catatonia: The the patient is not an imminent danger to herself or others necessitating inpatient psychiatric hospitalizations. patient was maintained on Zyprexa 10 mg by mouth nightly by Dr. Lelon Huh. We'll change Klonopin to Ativan secondary to respiratory distress. Will also add Celexa 10 mg by mouth daily for anxiety and panic-like symptoms. Other options include Buspar.   At this time, the patient is not an imminent danger to herself or others necessitating inpatient psychiatric hospitalization.    Daily contact with patient to assess and evaluate symptoms and progress in treatment and Medication management  Disposition: Patient does not meet criteria for psychiatric inpatient admission.  Jay Schlichter, MD 01/29/2017 7:57 PM

## 2017-01-29 NOTE — Progress Notes (Signed)
Patient ID: Carolyn Valdez, female   DOB: 04/28/1941, 76 y.o.   MRN: [id number]  [facility] PROGRESS NOTE  Carolyn Valdez BPZ:[id number] DOB: October 02, 1941 DOA: 01/23/2017 PCP: Leonel Ramsay, MD  HPI/Subjective: Patient recognized me from a prior hospitalization. She does complain of some shortness of breath. Very poor historian. Patient stated her appetite is okay but did not eat breakfast or lunch as per the nurse. Then she fell asleep while talking with her  Objective: Vitals:   01/29/17 0818 01/29/17 1241  BP: (!) 147/45 99/76  Pulse: 72 64  Resp:  19  Temp:  98.3 F (36.8 C)    Filed Weights   01/23/17 0903  Weight: 127 kg (280 lb)    ROS: Review of Systems  Constitutional: Negative for chills and fever.  Eyes: Negative for blurred vision.  Respiratory: Positive for shortness of breath. Negative for cough.   Cardiovascular: Negative for chest pain.  Gastrointestinal: Negative for abdominal pain, constipation, diarrhea, nausea and vomiting.  Genitourinary: Negative for dysuria.  Musculoskeletal: Negative for joint pain.  Neurological: Negative for dizziness and headaches.   Exam: Physical Exam  Constitutional: She appears lethargic.  HENT:  Nose: No mucosal edema.  Mouth/Throat: No oropharyngeal exudate or posterior oropharyngeal edema.  Eyes: Conjunctivae, EOM and lids are normal. Pupils are equal, round, and reactive to light.  Neck: No JVD present. Carotid bruit is not present. No edema present. No thyroid mass and no thyromegaly present.  Cardiovascular: S1 normal and S2 normal.  Exam reveals no gallop.   No murmur heard. Pulses:      Dorsalis pedis pulses are 2+ on the right side, and 2+ on the left side.  Respiratory: No respiratory distress. She has decreased breath sounds in the right upper field, the right middle field, the right lower field, the left upper field, the left middle field and the left lower field. She has no wheezes. She has no rhonchi. She has  rales in the right lower field and the left lower field.  GI: Soft. Bowel sounds are normal. There is no tenderness.  Musculoskeletal:       Right knee: She exhibits swelling.       Left knee: She exhibits swelling.       Right ankle: She exhibits swelling.       Left ankle: She exhibits swelling.  Lymphadenopathy:    She has no cervical adenopathy.  Neurological: She appears lethargic.  Skin: Skin is warm. No rash noted. Nails show no clubbing.  Psychiatric: She has a normal mood and affect.      Data Reviewed: Basic Metabolic Panel:  Recent Labs Lab 01/23/17 0904 01/24/17 0319 01/26/17 0800 01/27/17 0507 01/29/17 0526  NA 133* 129* 130* 130* 127*  K 5.0 4.8 5.1 5.2* 5.4*  CL 90* 86* 82* 82* 79*  CO2 37* 37* 43* 43* 42*  GLUCOSE 98 169* 172* 170* 161*  BUN 28* 25* 26* 30* 36*  CREATININE 1.08* 0.88 1.11* 1.12* 1.13*  CALCIUM 9.8 9.6 9.6 9.5 9.4   Liver Function Tests:  Recent Labs Lab 01/23/17 0904  AST 15  ALT 9*  ALKPHOS 74  BILITOT 0.9  PROT 7.5  ALBUMIN 3.1*   CBC:  Recent Labs Lab 01/23/17 0904 01/24/17 0405 01/26/17 0800  WBC 7.6 6.9 8.4  HGB 8.9* 8.3* 8.6*  HCT 26.3* 25.3* 25.9*  MCV 82.8 83.4 82.1  PLT 407 345 344   Cardiac Enzymes:  Recent Labs Lab 01/23/17 0904 01/28/17 1403  01/28/17 2109 01/29/17 0526  TROPONINI <0.03 <0.03 <0.03 <0.03   BNP (last 3 results)  Recent Labs  02/23/16 1617 08/09/16 1550 08/31/16 2145  BNP 181.0* 311.0* 163.0*     CBG:  Recent Labs Lab 01/28/17 1638 01/28/17 2105 01/28/17 2345 01/29/17 0747 01/29/17 1130  GLUCAP 213* 221* 200* 135* 116*    Recent Results (from the past 240 hour(s))  MRSA PCR Screening     Status: None   Collection Time: 01/23/17  8:56 AM  Result Value Ref Range Status   MRSA by PCR NEGATIVE NEGATIVE Final    Comment:        The GeneXpert MRSA Assay (FDA approved for NASAL specimens only), is one component of a comprehensive MRSA colonization surveillance  program. It is not intended to diagnose MRSA infection nor to guide or monitor treatment for MRSA infections.      Studies: Dg Chest Port 1 View  Result Date: 01/28/2017 CLINICAL DATA:  Shortness of breath. EXAM: PORTABLE CHEST 1 VIEW COMPARISON:  CT chest 01/24/2017 FINDINGS: Diffuse bilateral interstitial thickening. Bilateral small pleural effusions. No pneumothorax. No focal consolidation. Stable cardiomegaly. The osseous structures are unremarkable. IMPRESSION: Findings consistent with CHF. Electronically Signed   By: Kathreen Devoid   On: 01/28/2017 13:49    Scheduled Meds: . amLODipine  2.5 mg Oral Daily  . atorvastatin  20 mg Oral Daily  . budesonide (PULMICORT) nebulizer solution  0.5 mg Nebulization BID  . clonazepam  0.25 mg Oral TID  . desmopressin  0.2 mg Oral QHS  . docusate sodium  100 mg Oral BID  . enoxaparin (LOVENOX) injection  40 mg Subcutaneous Q12H  . feeding supplement (ENSURE ENLIVE)  237 mL Oral TID BM  . furosemide  60 mg Intravenous BID  . guaiFENesin  600 mg Oral BID  . hydrOXYzine  25 mg Oral QHS  . insulin aspart  0-20 Units Subcutaneous TID WC  . insulin aspart  0-5 Units Subcutaneous QHS  . insulin glargine  43 Units Subcutaneous QHS  . ipratropium-albuterol  3 mL Nebulization Q6H  . iron polysaccharides  150 mg Oral TID  . lamoTRIgine  25 mg Oral Daily  . levETIRAcetam  250 mg Oral BID  . levofloxacin  500 mg Oral Daily  . loratadine  10 mg Oral Daily  . mouth rinse  15 mL Mouth Rinse BID  . metoprolol  50 mg Oral BID  . nystatin   Topical TID  . OLANZapine zydis  10 mg Oral QHS  . oxybutynin  10 mg Oral Daily  . pantoprazole  40 mg Oral BID AC  . polyethylene glycol  17 g Oral Daily  . senna  1 tablet Oral Daily  . sodium chloride flush  3 mL Intravenous Q12H  . vitamin E  1,000 Units Oral Daily    Assessment/Plan:  1. Acute on chronic respiratory failure with hypoxia and hypercarbia. Spoke with nursing staff and respiratory therapist  BiPAP while sleeping. Need to have her moral night hypoxic side and avoid Ventimask if possible. Maximum 3 L nasal cannula. Add nebulizer treatments with budesonide. On Levaquin 2. Acute on chronic diastolic congestive heart failure. Increase IV Lasix to 60 mg IV twice a day 3. Hyperkalemia. 1 dose of Kayexalate. Patient unable to tolerate Veltassa 4. Type 2 diabetes mellitus. On sliding scale and insulin. Discontinue Actos 5. Seizure history on Keppra and lamotrigine 6. 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 Hyperlipidemia unspecified on atorvastatin  8. Weakness. Physical therapy evaluation 9. Essential hypertension on Norvasc and metoprolol   Code Status:     Code Status Orders        Start     Ordered   01/23/17 1320  Full code  Continuous     01/23/17 1320    Code Status History    Date Active Date Inactive Code Status Order ID Comments User Context   10/05/2016 12:16 PM 10/26/2016  5:40 PM DNR [id number]  Knox Royalty, NP Inpatient   09/24/2016  5:31 PM 10/05/2016 12:16 PM Full Code [id number]  Gladstone Lighter, MD Inpatient   09/01/2016  2:17 AM 09/05/2016  4:28 PM Full Code [id number]  Harvie Bridge, DO Inpatient   08/09/2016  8:27 PM 08/12/2016  8:15 PM Full Code [id number]  Henreitta Leber, MD Inpatient   09/30/2015  2:23 PM 10/07/2015  5:37 PM Full Code [id number]  Loletha Grayer, MD ED   09/05/2015  5:56 PM 09/09/2015  5:38 PM DNR [id number]  Loletha Grayer, MD ED    Advance Directive Documentation     Most Recent Value  Type of Advance Directive  Healthcare Power of [place], Living will  Pre-existing out of facility DNR order (yellow form or pink MOST form)  -  "MOST" Form in Place?  -     Disposition Plan: To be determined  Antibiotics:  Levaquin   Time spent: 28 minutes  Loletha Grayer  [employer]

## 2017-01-29 NOTE — Progress Notes (Signed)
Pt keeps taking the mask off her face when awake causing SAT's to drop.

## 2017-01-29 NOTE — Progress Notes (Signed)
PT Cancellation Note  Patient Details Name: Carolyn Valdez MRN: [id number] DOB: [birth year]   Cancelled Treatment:    Reason Eval/Treat Not Completed: Medical issues which prohibited therapy   Chart reviewed.  Potassium 5.4. Session held due to PT protocols.   Chesley Noon 01/29/2017, 11:09 AM

## 2017-01-29 NOTE — Progress Notes (Signed)
Patient refused to be turned this shift.  She was instructed on importance of prevention of skin breakdown, but refused to be repositioned off her back. Lauris Poag, RN 01/29/17 1444

## 2017-01-29 NOTE — Progress Notes (Signed)
Arrived at patient's room. Saturation noted at 77%. Patient has taken off oxygen mask once again.  Mask applied and increased to 40% sats now noted at 93%

## 2017-01-30 LAB — URINALYSIS, ROUTINE W REFLEX MICROSCOPIC
Bilirubin Urine: NEGATIVE
GLUCOSE, UA: NEGATIVE mg/dL
Hgb urine dipstick: NEGATIVE
KETONES UR: NEGATIVE mg/dL
NITRITE: NEGATIVE
PROTEIN: 100 mg/dL — AB
Specific Gravity, Urine: 1.012 (ref 1.005–1.030)
pH: 6 (ref 5.0–8.0)

## 2017-01-30 LAB — BASIC METABOLIC PANEL
ANION GAP: 6 (ref 5–15)
BUN: 36 mg/dL — ABNORMAL HIGH (ref 6–20)
CHLORIDE: 76 mmol/L — AB (ref 101–111)
CO2: 45 mmol/L — AB (ref 22–32)
CREATININE: 1.16 mg/dL — AB (ref 0.44–1.00)
Calcium: 9.6 mg/dL (ref 8.9–10.3)
GFR calc non Af Amer: 45 mL/min — ABNORMAL LOW (ref >60)
GFR, EST AFRICAN AMERICAN: 52 mL/min — AB (ref >60)
Glucose, Bld: 203 mg/dL — ABNORMAL HIGH (ref 65–99)
Potassium: 5 mmol/L (ref 3.5–5.1)
Sodium: 127 mmol/L — ABNORMAL LOW (ref 135–145)

## 2017-01-30 LAB — GLUCOSE, CAPILLARY
GLUCOSE-CAPILLARY: 206 mg/dL — AB (ref 65–99)
GLUCOSE-CAPILLARY: 239 mg/dL — AB (ref 65–99)
Glucose-Capillary: 170 mg/dL — ABNORMAL HIGH (ref 65–99)
Glucose-Capillary: 177 mg/dL — ABNORMAL HIGH (ref 65–99)

## 2017-01-30 MED ORDER — CEPHALEXIN 500 MG PO CAPS
500.0000 mg | ORAL_CAPSULE | Freq: Two times a day (BID) | ORAL | Status: DC
  Administered 2017-01-30 – 2017-02-02 (×6): 500 mg via ORAL
  Filled 2017-01-30 (×8): qty 1

## 2017-01-30 MED ORDER — IPRATROPIUM-ALBUTEROL 0.5-2.5 (3) MG/3ML IN SOLN
3.0000 mL | Freq: Three times a day (TID) | RESPIRATORY_TRACT | Status: DC
  Administered 2017-01-31 – 2017-02-04 (×11): 3 mL via RESPIRATORY_TRACT
  Filled 2017-01-30 (×11): qty 3

## 2017-01-30 NOTE — Progress Notes (Signed)
Patient ID: Carolyn Valdez, female   DOB: 27-Mar-1941, 76 y.o.   MRN: [id number]  [facility] PROGRESS NOTE  [name] [id number]:[id number] DOB: January 24, 1941 DOA: 01/23/2017 PCP: Leonel Ramsay, MD  HPI/Subjective: Patient states that she has some shortness of breath. Also some burning on urination. Falls asleep easily when I'm talking with her.  Objective: Vitals:   01/30/17 0859 01/30/17 1308  BP: (!) 126/44 (!) 130/38  Pulse: 91 67  Resp:  18  Temp:  98.5 F (36.9 C)    Filed Weights   01/23/17 0903  Weight: 127 kg (280 lb)    ROS: Review of Systems  Unable to perform ROS: Acuity of condition  Respiratory: Positive for shortness of breath. Negative for cough.   Cardiovascular: Negative for chest pain.  Gastrointestinal: Negative for abdominal pain.  Genitourinary: Positive for dysuria.  Musculoskeletal: Negative for joint pain.   Exam: Physical Exam  Constitutional: She appears lethargic.  HENT:  Nose: No mucosal edema.  Mouth/Throat: No oropharyngeal exudate or posterior oropharyngeal edema.  Eyes: Conjunctivae, EOM and lids are normal. Pupils are equal, round, and reactive to light.  Neck: No JVD present. Carotid bruit is not present. No edema present. No thyroid mass and no thyromegaly present.  Cardiovascular: S1 normal and S2 normal.  Exam reveals no gallop.   No murmur heard. Pulses:      Dorsalis pedis pulses are 2+ on the right side, and 2+ on the left side.  Respiratory: No respiratory distress. She has decreased breath sounds in the right upper field, the right middle field, the right lower field, the left upper field, the left middle field and the left lower field. She has no wheezes. She has no rhonchi. She has rales in the right lower field and the left lower field.  GI: Soft. Bowel sounds are normal. There is no tenderness.  Musculoskeletal:       Right knee: She exhibits swelling.       Left knee: She exhibits swelling.       Right ankle: She exhibits  swelling.       Left ankle: She exhibits swelling.  Lymphadenopathy:    She has no cervical adenopathy.  Neurological: She appears lethargic.  Skin: Skin is warm. No rash noted. Nails show no clubbing.  Psychiatric: She has a normal mood and affect.      Data Reviewed: Basic Metabolic Panel:  Recent Labs Lab 01/24/17 0319 01/26/17 0800 01/27/17 0507 01/29/17 0526 01/30/17 0507  NA 129* 130* 130* 127* 127*  K 4.8 5.1 5.2* 5.4* 5.0  CL 86* 82* 82* 79* 76*  CO2 37* 43* 43* 42* 45*  GLUCOSE 169* 172* 170* 161* 203*  BUN 25* 26* 30* 36* 36*  CREATININE 0.88 1.11* 1.12* 1.13* 1.16*  CALCIUM 9.6 9.6 9.5 9.4 9.6   CBC:  Recent Labs Lab 01/24/17 0405 01/26/17 0800  WBC 6.9 8.4  HGB 8.3* 8.6*  HCT 25.3* 25.9*  MCV 83.4 82.1  PLT 345 344   Cardiac Enzymes:  Recent Labs Lab 01/28/17 1403 01/28/17 2109 01/29/17 0526  TROPONINI <0.03 <0.03 <0.03   BNP (last 3 results)  Recent Labs  02/23/16 1617 08/09/16 1550 08/31/16 2145  BNP 181.0* 311.0* 163.0*     CBG:  Recent Labs Lab 01/29/17 1130 01/29/17 1627 01/29/17 2138 01/30/17 0741 01/30/17 1124  GLUCAP 116* 183* 175* 206* 170*    Recent Results (from the past 240 hour(s))  MRSA PCR Screening     Status:  None   Collection Time: 01/23/17  8:56 AM  Result Value Ref Range Status   MRSA by PCR NEGATIVE NEGATIVE Final    Comment:        The GeneXpert MRSA Assay (FDA approved for NASAL specimens only), is one component of a comprehensive MRSA colonization surveillance program. It is not intended to diagnose MRSA infection nor to guide or monitor treatment for MRSA infections.      Scheduled Meds: . amLODipine  2.5 mg Oral Daily  . atorvastatin  20 mg Oral Daily  . budesonide (PULMICORT) nebulizer solution  0.5 mg Nebulization BID  . cephALEXin  500 mg Oral Q12H  . citalopram  10 mg Oral Daily  . desmopressin  0.2 mg Oral QHS  . docusate sodium  100 mg Oral BID  . enoxaparin (LOVENOX)  injection  40 mg Subcutaneous Q12H  . feeding supplement (ENSURE ENLIVE)  237 mL Oral TID BM  . furosemide  60 mg Intravenous BID  . guaiFENesin  600 mg Oral BID  . hydrOXYzine  25 mg Oral QHS  . insulin aspart  0-20 Units Subcutaneous TID WC  . insulin aspart  0-5 Units Subcutaneous QHS  . insulin glargine  43 Units Subcutaneous QHS  . ipratropium-albuterol  3 mL Nebulization Q6H  . iron polysaccharides  150 mg Oral TID  . lamoTRIgine  25 mg Oral Daily  . levETIRAcetam  250 mg Oral BID  . loratadine  10 mg Oral Daily  . LORazepam  0.5 mg Oral BID  . mouth rinse  15 mL Mouth Rinse BID  . metoprolol  50 mg Oral BID  . nystatin   Topical TID  . OLANZapine zydis  10 mg Oral QHS  . oxybutynin  10 mg Oral Daily  . pantoprazole  40 mg Oral BID AC  . polyethylene glycol  17 g Oral Daily  . senna  1 tablet Oral Daily  . sodium chloride flush  3 mL Intravenous Q12H  . vitamin E  1,000 Units Oral Daily    Assessment/Plan:  1. Acute on chronic respiratory failure with hypoxia and hypercarbia. Patient needs the BiPAP while sleeping. Need to have her more on the hypoxic side and avoid Ventimask if possible. Maximum 3 L nasal cannula. Added nebulizer treatments with budesonide. 2. Dysuria and positive urine analysis. The patient had a positive urinalysis when the patient was admitted but no urine culture was sent at that time. The patient was on Levaquin in the hospital course. Since the repeat urine analysis was positive, I will add on a urine culture and switch antibiotics to Keflex 3. Acute on chronic diastolic congestive heart failure. Increased IV Lasix to 60 mg IV twice a day 4. Hyperkalemia. Received 1 dose of Kayexalate yesterday. Potassium still borderline today. Monitor again tomorrow. 5. Type 2 diabetes mellitus. On sliding scale and insulin. Discontinued Actos 6. Seizure history on Keppra and lamotrigine 7. 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 Hyperlipidemia unspecified on atorvastatin 9. Weakness. Physical therapy evaluation 10. Essential hypertension on Norvasc and metoprolol   Code Status:     Code Status Orders        Start     Ordered   01/23/17 1320  Full code  Continuous     01/23/17 1320    Code Status History    Date Active Date Inactive Code Status Order ID Comments User Context   10/05/2016 12:16 PM 10/26/2016  5:40 PM DNR [id number]  Knox Royalty, NP Inpatient   09/24/2016  5:31 PM 10/05/2016 12:16 PM Full Code [id number]  Gladstone Lighter, MD Inpatient   09/01/2016  2:17 AM 09/05/2016  4:28 PM Full Code [id number]  Harvie Bridge, DO Inpatient   08/09/2016  8:27 PM 08/12/2016  8:15 PM Full Code [id number]  Henreitta Leber, MD Inpatient   09/30/2015  2:23 PM 10/07/2015  5:37 PM Full Code [id number]  Loletha Grayer, MD ED   09/05/2015  5:56 PM 09/09/2015  5:38 PM DNR [id number]  Loletha Grayer, MD ED    Advance Directive Documentation     Most Recent Value  Type of Advance Directive  Healthcare Power of [place], Living will  Pre-existing out of facility DNR order (yellow form or pink MOST form)  -  "MOST" Form in Place?  -     Disposition Plan: To be determined  Antibiotics:  Keflex  Time spent: 26 minutes  Loletha Grayer  [employer]

## 2017-01-30 NOTE — Progress Notes (Signed)
Patient has refused bipap for the night.  No distress noted at this time

## 2017-01-30 NOTE — Progress Notes (Signed)
Patient on nasal cannula at this time.  Placed her on bipap a little after midnight. Patient very hesitant but attempted.  She is currently in no distress at this time. Doesn't want anything on her face currently.

## 2017-01-31 LAB — BASIC METABOLIC PANEL
ANION GAP: 6 (ref 5–15)
BUN: 39 mg/dL — ABNORMAL HIGH (ref 6–20)
CHLORIDE: 75 mmol/L — AB (ref 101–111)
CO2: 48 mmol/L — AB (ref 22–32)
Calcium: 9.2 mg/dL (ref 8.9–10.3)
Creatinine, Ser: 1.17 mg/dL — ABNORMAL HIGH (ref 0.44–1.00)
GFR calc Af Amer: 52 mL/min — ABNORMAL LOW (ref >60)
GFR calc non Af Amer: 45 mL/min — ABNORMAL LOW (ref >60)
GLUCOSE: 213 mg/dL — AB (ref 65–99)
POTASSIUM: 4.8 mmol/L (ref 3.5–5.1)
Sodium: 129 mmol/L — ABNORMAL LOW (ref 135–145)

## 2017-01-31 LAB — URINE CULTURE

## 2017-01-31 LAB — GLUCOSE, CAPILLARY
GLUCOSE-CAPILLARY: 185 mg/dL — AB (ref 65–99)
GLUCOSE-CAPILLARY: 257 mg/dL — AB (ref 65–99)
Glucose-Capillary: 168 mg/dL — ABNORMAL HIGH (ref 65–99)
Glucose-Capillary: 188 mg/dL — ABNORMAL HIGH (ref 65–99)

## 2017-01-31 NOTE — Care Management Note (Addendum)
Case Management Note  Patient Details  Name: ASHLON LOTTMAN MRN: [id number] Date of Birth: May 25, 1941  Subjective/Objective:                  Patient is from [place] [id number]. I spoke Engineer, civil (consulting) at [employer]. Patient is on Chronic O2 ?agency name is unknown at this time.  Patient uses front-wheeled walker to ambulate. Her PCP is with [facility]. No CPAP or BiPAP at facility;  No nebulizer machine- [facility] is requesting one for this patient. They would like to use [facility] for home health services). Patient would benefit from [place], [place].  Action/Plan:   Referral made to Tim with [facility] and they have accepted this patient. RNCM will continue to follow.    Expected Discharge Date:                  Expected Discharge Plan:     In-House Referral:     Discharge planning Services  CM Consult  Post Acute Care Choice:  Home Health Choice offered to:     DME Arranged:    DME Agency:     HH Arranged:    HH Agency:     Status of Service:  In process, will continue to follow  If discussed at Long Length of Stay Meetings, dates discussed:    Additional Comments:  Marshell Garfinkel, RN 01/31/2017, 8:06 AM

## 2017-01-31 NOTE — Care Management Important Message (Signed)
Important Message  Patient Details  Name: Carolyn Valdez MRN: [id number] Date of Birth: 05/24/1941   Medicare Important Message Given:  Yes    Marshell Garfinkel, RN 01/31/2017, 8:01 AM

## 2017-01-31 NOTE — Progress Notes (Signed)
PT Cancellation Note  Patient Details Name: Carolyn Valdez MRN: [id number] DOB: 08/31/1941   Cancelled Treatment:    Reason Eval/Treat Not Completed: Patient declined, no reason specified.  Pt firmly refusing any PT at this time.  Will re-attempt PT treatment at a later date/time as able.  Leitha Bleak, PT 01/31/17, 10:42 AM [phone]

## 2017-01-31 NOTE — Progress Notes (Signed)
Family Meeting Note  Advance Directive:no  Today a meeting took place with the Patient and Sister [name]) at bedside.  The following clinical team members were present during this meeting:MD  The following were discussed:Patient's diagnosis: , Patient's progosis: > 12 months and Goals for treatment: DNR  Additional follow-up to be provided: Palliative care c/s - family and patient interested and agreeable for palliative care services at home if qualifies  Time spent during discussion:20 minutes  Max Sane, MD

## 2017-01-31 NOTE — Progress Notes (Signed)
Pt. Refuses to wear BIPAP at this time. No distress noted. Will continue to assess.

## 2017-01-31 NOTE — Progress Notes (Signed)
Diastolic low.  Dr Manuella Ghazi said to give norvasc and hold metoprolol

## 2017-01-31 NOTE — Progress Notes (Signed)
Patient ID: Carolyn Valdez, female   DOB: May 18, 1941, 76 y.o.   MRN: [id number]  [facility] PROGRESS NOTE  Carolyn Valdez OMV:[id number] DOB: May 03, 1941 DOA: 01/23/2017 PCP: Carolyn Ramsay, MD  HPI/Subjective: Feels nauseous, sister at bedside. Agreeable with going home instead of home place  Objective: Vitals:   01/31/17 0949 01/31/17 1126  BP: (!) 141/44 (!) 128/36  Pulse: 79 74  Resp: 18 18  Temp: 97.7 F (36.5 C) 97.9 F (36.6 C)    Filed Weights   01/23/17 0903  Weight: 127 kg (280 lb)    ROS: Review of Systems  Unable to perform ROS: Acuity of condition  Respiratory: Positive for shortness of breath. Negative for cough.   Cardiovascular: Negative for chest pain.  Gastrointestinal: Negative for abdominal pain.  Genitourinary: Positive for dysuria.  Musculoskeletal: Negative for joint pain.   Exam: Physical Exam  HENT:  Nose: No mucosal edema.  Mouth/Throat: No oropharyngeal exudate or posterior oropharyngeal edema.  Eyes: Conjunctivae, EOM and lids are normal. Pupils are equal, round, and reactive to light.  Neck: No JVD present. Carotid bruit is not present. No edema present. No thyroid mass and no thyromegaly present.  Cardiovascular: S1 normal and S2 normal.  Exam reveals no gallop.   No murmur heard. Pulses:      Dorsalis pedis pulses are 2+ on the right side, and 2+ on the left side.  Respiratory: No respiratory distress. She has decreased breath sounds in the right upper field, the right middle field, the right lower field, the left upper field, the left middle field and the left lower field. She has no wheezes. She has no rhonchi. She has rales in the right lower field and the left lower field.  GI: Soft. Bowel sounds are normal. There is no tenderness.  Musculoskeletal:       Right knee: She exhibits no swelling.       Left knee: She exhibits no swelling.       Right ankle: She exhibits no swelling.       Left ankle: She exhibits no swelling.   Lymphadenopathy:    She has no cervical adenopathy.  Skin: Skin is warm. No rash noted. Nails show no clubbing.  Psychiatric: She has a normal mood and affect.    Data Reviewed: Basic Metabolic Panel:  Recent Labs Lab 01/26/17 0800 01/27/17 0507 01/29/17 0526 01/30/17 0507 01/31/17 0422  NA 130* 130* 127* 127* 129*  K 5.1 5.2* 5.4* 5.0 4.8  CL 82* 82* 79* 76* 75*  CO2 43* 43* 42* 45* 48*  GLUCOSE 172* 170* 161* 203* 213*  BUN 26* 30* 36* 36* 39*  CREATININE 1.11* 1.12* 1.13* 1.16* 1.17*  CALCIUM 9.6 9.5 9.4 9.6 9.2   CBC:  Recent Labs Lab 01/26/17 0800  WBC 8.4  HGB 8.6*  HCT 25.9*  MCV 82.1  PLT 344   Cardiac Enzymes:  Recent Labs Lab 01/28/17 1403 01/28/17 2109 01/29/17 0526  TROPONINI <0.03 <0.03 <0.03   BNP (last 3 results)  Recent Labs  02/23/16 1617 08/09/16 1550 08/31/16 2145  BNP 181.0* 311.0* 163.0*     CBG:  Recent Labs Lab 01/30/17 1634 01/30/17 2136 01/31/17 0737 01/31/17 1121 01/31/17 1605  GLUCAP 239* 177* 168* 188* 185*    Recent Results (from the past 240 hour(s))  MRSA PCR Screening     Status: None   Collection Time: 01/23/17  8:56 AM  Result Value Ref Range Status   MRSA by PCR NEGATIVE  NEGATIVE Final    Comment:        The GeneXpert MRSA Assay (FDA approved for NASAL specimens only), is one component of a comprehensive MRSA colonization surveillance program. It is not intended to diagnose MRSA infection nor to guide or monitor treatment for MRSA infections.   Urine culture     Status: Abnormal   Collection Time: 01/30/17  5:07 AM  Result Value Ref Range Status   Specimen Description URINE, RANDOM  Final   Special Requests NONE  Final   Culture MULTIPLE SPECIES PRESENT, SUGGEST RECOLLECTION (A)  Final   Report Status 01/31/2017 FINAL  Final     Scheduled Meds: . amLODipine  2.5 mg Oral Daily  . atorvastatin  20 mg Oral Daily  . budesonide (PULMICORT) nebulizer solution  0.5 mg Nebulization BID  .  cephALEXin  500 mg Oral Q12H  . citalopram  10 mg Oral Daily  . desmopressin  0.2 mg Oral QHS  . docusate sodium  100 mg Oral BID  . enoxaparin (LOVENOX) injection  40 mg Subcutaneous Q12H  . feeding supplement (ENSURE ENLIVE)  237 mL Oral TID BM  . furosemide  60 mg Intravenous BID  . guaiFENesin  600 mg Oral BID  . hydrOXYzine  25 mg Oral QHS  . insulin aspart  0-20 Units Subcutaneous TID WC  . insulin aspart  0-5 Units Subcutaneous QHS  . insulin glargine  43 Units Subcutaneous QHS  . ipratropium-albuterol  3 mL Nebulization TID  . iron polysaccharides  150 mg Oral TID  . lamoTRIgine  25 mg Oral Daily  . levETIRAcetam  250 mg Oral BID  . loratadine  10 mg Oral Daily  . LORazepam  0.5 mg Oral BID  . mouth rinse  15 mL Mouth Rinse BID  . metoprolol  50 mg Oral BID  . nystatin   Topical TID  . OLANZapine zydis  10 mg Oral QHS  . oxybutynin  10 mg Oral Daily  . pantoprazole  40 mg Oral BID AC  . polyethylene glycol  17 g Oral Daily  . senna  1 tablet Oral Daily  . sodium chloride flush  3 mL Intravenous Q12H  . vitamin E  1,000 Units Oral Daily    Assessment/Plan:  1. Acute on chronic respiratory failure with hypoxia and hypercarbia: continue BiPAP while sleeping. Need to have her more on the hypoxic side and avoid Ventimask if possible. Maximum 3 L nasal cannula. continue nebulizer treatments with budesonide. 2. UTI: based on UA. get urine culture. continue Keflex 3. Acute on chronic diastolic congestive heart failure: continue IV Lasix to 60 mg IV twice a day 4. Hyperkalemia: Resolved 5. Type 2 diabetes mellitus. On sliding scale and insulin. Discontinued Actos 6. Seizure history on Keppra and lamotrigine 7. 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 Hyperlipidemia unspecified on atorvastatin 9. Weakness. Physical therapy evaluation 10. Essential hypertension on Norvasc and metoprolol   Code Status: DO NOT RESUSCITATE Disposition Plan: A  long discussion with patient's sister [name] and patient at bedside - they feel they are paying more than $6000 a month for her home place, her sister feels that she would be much better off at home financially and physically.  She would hire 24/7 caregiver, which she is already working on to set up.  I have given her discharge day of potentially this Wednesday.  So as accordingly, she can set up those caregiver services to be started if she truly  wants to do this.  I have also told her that we will likely set up home health services along with palliative care at home if she qualifies.  Antibiotics:  Keflex  Discussed with care management and nursing  Time spent: 26 minutes  [place]

## 2017-01-31 NOTE — Clinical Social Work Note (Signed)
CSW informed by attending that patient's sister discussed possibly taking patient back home with hired caregivers rather than pay for patient to remain at [facility]. CSW will keep patient's bed at [facility] as patient's sister on previous admission continued to change her mind on disposition and on whether or not patient could be cared for at home.  Shela Leff MSW,LCSW [phone]

## 2017-02-01 LAB — GLUCOSE, CAPILLARY
GLUCOSE-CAPILLARY: 155 mg/dL — AB (ref 65–99)
Glucose-Capillary: 160 mg/dL — ABNORMAL HIGH (ref 65–99)
Glucose-Capillary: 155 mg/dL — ABNORMAL HIGH (ref 65–99)
Glucose-Capillary: 138 mg/dL — ABNORMAL HIGH (ref 65–99)

## 2017-02-01 LAB — CBC
HCT: 22.6 % — ABNORMAL LOW (ref 35.0–47.0)
Hemoglobin: 7.5 g/dL — ABNORMAL LOW (ref 12.0–16.0)
MCH: 26.9 pg (ref 26.0–34.0)
MCHC: 33.2 g/dL (ref 32.0–36.0)
MCV: 81.2 fL (ref 80.0–100.0)
Platelets: 336 10*3/uL (ref 150–440)
RBC: 2.78 MIL/uL — ABNORMAL LOW (ref 3.80–5.20)
RDW: 15.4 % — ABNORMAL HIGH (ref 11.5–14.5)
WBC: 7.4 10*3/uL (ref 3.6–11.0)

## 2017-02-01 LAB — BASIC METABOLIC PANEL
Anion gap: 6 (ref 5–15)
BUN: 37 mg/dL — AB (ref 6–20)
CO2: 48 mmol/L — ABNORMAL HIGH (ref 22–32)
Calcium: 9.4 mg/dL (ref 8.9–10.3)
Chloride: 74 mmol/L — ABNORMAL LOW (ref 101–111)
Creatinine, Ser: 0.94 mg/dL (ref 0.44–1.00)
GFR calc Af Amer: >60 mL/min (ref >60)
GFR, EST NON AFRICAN AMERICAN: 58 mL/min — AB (ref >60)
Glucose, Bld: 171 mg/dL — ABNORMAL HIGH (ref 65–99)
Potassium: 4.8 mmol/L (ref 3.5–5.1)
SODIUM: 128 mmol/L — AB (ref 135–145)

## 2017-02-01 LAB — URINE CULTURE

## 2017-02-01 MED ORDER — FUROSEMIDE 20 MG PO TABS
20.0000 mg | ORAL_TABLET | Freq: Every day | ORAL | Status: DC
  Administered 2017-02-02: 20 mg via ORAL
  Filled 2017-02-01: qty 1

## 2017-02-01 MED ORDER — SODIUM CHLORIDE 0.9 % IV SOLN: INTRAVENOUS | Status: DC

## 2017-02-01 NOTE — Progress Notes (Signed)
PT Cancellation Note  Patient Details Name: LADEIDRA BORYS MRN: [id number] DOB: Feb 04, 1941   Cancelled Treatment:    Reason Eval/Treat Not Completed: Patient declined, no reason specified   Pt approached and encouraged to participate in therapy this am.  Pt refused with c/o nausea.  Pt stated she felt her HR was fast.  104 at rest.  O2 sats noted to bed 81% on 3lpm.  Verbal cues and education for proper breathing increased sats to 87%.  Discussed with primary nurse who is aware stating she had removed O2 prior to my arrival.  Will continue as appropriate.   Chesley Noon 02/01/2017, 9:47 AM

## 2017-02-01 NOTE — Progress Notes (Signed)
Patient ID: Carolyn Valdez, female   DOB: January 06, 1941, 76 y.o.   MRN: [id number]  [facility] PROGRESS NOTE  ELVETA RAPE VEL:[id number] DOB: November 26, 1940 DOA: 01/23/2017 PCP: Leonel Ramsay, MD  HPI/Subjective: Very sleepy, falls back to sleep easily when wakes up on deep stimuli - per nursing she was refusing BiPAP last night and may not have slept well   Objective: Vitals:   01/31/17 2305 02/01/17 0551  BP: (!) 137/46 (!) 148/44  Pulse: 83 85  Resp:  20  Temp:  97.6 F (36.4 C)    Filed Weights   01/23/17 0903  Weight: 127 kg (280 lb)    ROS: Review of Systems  Unable to perform ROS: Acuity of condition  Respiratory: Positive for shortness of breath. Negative for cough.   Cardiovascular: Negative for chest pain.  Gastrointestinal: Negative for abdominal pain.  Genitourinary: Positive for dysuria.  Musculoskeletal: Negative for joint pain.   Exam: Physical Exam  HENT:  Nose: No mucosal edema.  Mouth/Throat: No oropharyngeal exudate or posterior oropharyngeal edema.  Eyes: Conjunctivae, EOM and lids are normal. Pupils are equal, round, and reactive to light.  Neck: No JVD present. Carotid bruit is not present. No edema present. No thyroid mass and no thyromegaly present.  Cardiovascular: S1 normal and S2 normal.  Exam reveals no gallop.   No murmur heard. Pulses:      Dorsalis pedis pulses are 2+ on the right side, and 2+ on the left side.  Respiratory: No respiratory distress. She has decreased breath sounds in the right upper field, the right middle field, the right lower field, the left upper field, the left middle field and the left lower field. She has no wheezes. She has no rhonchi. She has rales in the right lower field and the left lower field.  GI: Soft. Bowel sounds are normal. There is no tenderness.  Musculoskeletal:       Right knee: She exhibits no swelling.       Left knee: She exhibits no swelling.       Right ankle: She exhibits no swelling.   Left ankle: She exhibits no swelling.  Lymphadenopathy:    She has no cervical adenopathy.  Skin: Skin is warm. No rash noted. Nails show no clubbing.  Psychiatric: She has a normal mood and affect.  sleepy  Data Reviewed: Basic Metabolic Panel:  Recent Labs Lab 01/27/17 0507 01/29/17 0526 01/30/17 0507 01/31/17 0422 02/01/17 0444  NA 130* 127* 127* 129* 128*  K 5.2* 5.4* 5.0 4.8 4.8  CL 82* 79* 76* 75* 74*  CO2 43* 42* 45* 48* 48*  GLUCOSE 170* 161* 203* 213* 171*  BUN 30* 36* 36* 39* 37*  CREATININE 1.12* 1.13* 1.16* 1.17* 0.94  CALCIUM 9.5 9.4 9.6 9.2 9.4   CBC:  Recent Labs Lab 01/26/17 0800 02/01/17 0444  WBC 8.4 7.4  HGB 8.6* 7.5*  HCT 25.9* 22.6*  MCV 82.1 81.2  PLT 344 336   Cardiac Enzymes:  Recent Labs Lab 01/28/17 1403 01/28/17 2109 01/29/17 0526  TROPONINI <0.03 <0.03 <0.03   BNP (last 3 results)  Recent Labs  02/23/16 1617 08/09/16 1550 08/31/16 2145  BNP 181.0* 311.0* 163.0*     CBG:  Recent Labs Lab 01/31/17 1121 01/31/17 1605 01/31/17 2117 02/01/17 0752 02/01/17 1141  GLUCAP 188* 185* 257* 155* 160*    Recent Results (from the past 240 hour(s))  MRSA PCR Screening     Status: None   Collection Time: 01/23/17  8:56 AM  Result Value Ref Range Status   MRSA by PCR NEGATIVE NEGATIVE Final    Comment:        The GeneXpert MRSA Assay (FDA approved for NASAL specimens only), is one component of a comprehensive MRSA colonization surveillance program. It is not intended to diagnose MRSA infection nor to guide or monitor treatment for MRSA infections.   Urine culture     Status: Abnormal   Collection Time: 01/30/17  5:07 AM  Result Value Ref Range Status   Specimen Description URINE, RANDOM  Final   Special Requests NONE  Final   Culture MULTIPLE SPECIES PRESENT, SUGGEST RECOLLECTION (A)  Final   Report Status 01/31/2017 FINAL  Final     Scheduled Meds: . amLODipine  2.5 mg Oral Daily  . atorvastatin  20 mg Oral  Daily  . budesonide (PULMICORT) nebulizer solution  0.5 mg Nebulization BID  . cephALEXin  500 mg Oral Q12H  . citalopram  10 mg Oral Daily  . desmopressin  0.2 mg Oral QHS  . docusate sodium  100 mg Oral BID  . enoxaparin (LOVENOX) injection  40 mg Subcutaneous Q12H  . feeding supplement (ENSURE ENLIVE)  237 mL Oral TID BM  . furosemide  60 mg Intravenous BID  . guaiFENesin  600 mg Oral BID  . hydrOXYzine  25 mg Oral QHS  . insulin aspart  0-20 Units Subcutaneous TID WC  . insulin aspart  0-5 Units Subcutaneous QHS  . insulin glargine  43 Units Subcutaneous QHS  . ipratropium-albuterol  3 mL Nebulization TID  . iron polysaccharides  150 mg Oral TID  . lamoTRIgine  25 mg Oral Daily  . levETIRAcetam  250 mg Oral BID  . loratadine  10 mg Oral Daily  . LORazepam  0.5 mg Oral BID  . mouth rinse  15 mL Mouth Rinse BID  . metoprolol  50 mg Oral BID  . nystatin   Topical TID  . OLANZapine zydis  10 mg Oral QHS  . oxybutynin  10 mg Oral Daily  . pantoprazole  40 mg Oral BID AC  . polyethylene glycol  17 g Oral Daily  . senna  1 tablet Oral Daily  . sodium chloride flush  3 mL Intravenous Q12H  . vitamin E  1,000 Units Oral Daily    Assessment/Plan:  1. Acute on chronic respiratory failure with hypoxia and hypercarbia: continue BiPAP while sleeping. Need to have her more on the hypoxic side and avoid Ventimask if possible. Maximum 3 L nasal cannula. continue nebulizer treatments with budesonide. 2. Hyponatremia: Na 127 today, could be due to SSRI, or overdiuresis (although she is showing + 5 liters, this may be inaccurate - she is on 60 mg IV bid lasix), Will hold lasix. on DDAVP, will c/s nephro 3. UTI: based on UA. get urine culture. continue Keflex 4. Acute on chronic diastolic congestive heart failure: hold lasix for now, her I & O doesn't seem accurate. 5. Hyperkalemia: Resolved 6. Type 2 diabetes mellitus. On sliding scale and insulin. Discontinued Actos 7. Seizure history on  Keppra and lamotrigine 8. 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 Hyperlipidemia unspecified on atorvastatin 10. Weakness. Physical therapy evaluation 11. Essential hypertension on Norvasc and metoprolol   Code Status: DO NOT RESUSCITATE Disposition Plan: likely back to [facility] when medically stable  Antibiotics:  Keflex  Discussed with care management and nursing  Time spent: 26 minutes  [place]

## 2017-02-01 NOTE — Care Management (Addendum)
Per MD patient was difficult to arouse today and will plan to monitor until tomorrow. [facility] updated. Sister is requesting EMS for transportation tomorrow. CSW updated. Received another call from MD stating that he has order nephro consult as patient's Sodium has dropped again to 127.

## 2017-02-01 NOTE — Consult Note (Signed)
Date: 02/01/2017                  Patient Name:  Carolyn Valdez  MRN: [id number]  DOB: 04-Oct-1941  Age / Sex: 76 y.o., female         PCP: Leonel Ramsay, MD                 Service Requesting Consult: Internal medicine/Dr. Manuella Ghazi                 Reason for Consult: hyponatremia            History of Present Illness: Patient is a 76 y.o. female with medical problems of anxiety, morbid obesity, congestive heart failure, diabetes, urinary incontinence, who was admitted to [facility] on 01/23/2017 for evaluation of altered mental status secondary to depression.  Patient presented to the hospital on 4/8 by EMS from [facility] complaining of shortness of breath.  EMS recorded oxygen saturation at 70-80% on room air.  Given 4 L nasal cannula oxygen supplementation which increased sats to 99%. Initial chest x-ray findings were suggestive of volume overload and mild CHF.  Patient was admitted for further evaluation and management She has now developed low sodium.  Nephrology team has been asked to evaluate hyponatremia. Patient's 2-D echo from October 2017 shows LV EF of 55-65%.  Mild LVH. Review of her medications show that patient was started on desmopressin/DDAVP 0.2 mg daily at bedtime on April 11.  The reason for starting this medication is not clear. Nursing staff reports that patient is incontinent.  She is also on oxybutynin   Medications: Outpatient medications: Prescriptions Prior to Admission  Medication Sig Dispense Refill Last Dose  . amLODipine (NORVASC) 10 MG tablet Take 1 tablet (10 mg total) by mouth daily. (Patient taking differently: Take 2.5 mg by mouth daily. ) 30 tablet 0 01/22/2017 at 0900  . atorvastatin (LIPITOR) 20 MG tablet Take 20 mg by mouth daily.    01/22/2017 at 0900  . benzonatate (TESSALON) 100 MG capsule Take 100 mg by mouth 3 (three) times daily as needed for cough.   prn at prn  . conjugated estrogens (PREMARIN) vaginal cream Place 1 Applicatorful vaginally 3 (three)  times a week. Pt uses on Monday, Wednesday, and Friday.   Past Week at Unknown time  . desmopressin (DDAVP) 0.2 MG tablet Take 1 tablet (0.2 mg total) by mouth at bedtime. 90 tablet 3 01/22/2017 at 2100  . docusate sodium (COLACE) 100 MG capsule Take 100 mg by mouth 2 (two) times daily.    01/22/2017 at 2100  . feeding supplement, ENSURE ENLIVE, (ENSURE ENLIVE) LIQD Take 237 mLs by mouth 3 (three) times daily between meals. 237 mL 12   . fexofenadine (ALLEGRA) 180 MG tablet Take 180 mg by mouth daily.   01/22/2017 at 0900  . fluticasone (FLONASE) 50 MCG/ACT nasal spray Place 2 sprays into the nose as needed.   prn at prn  . furosemide (LASIX) 40 MG tablet Take 40 mg by mouth daily.   01/22/2017 at 0900  . hydrOXYzine (ATARAX/VISTARIL) 25 MG tablet Take 1 tablet by mouth at bedtime.    01/22/2017 at 2100  . insulin aspart (NOVOLOG) 100 UNIT/ML injection Inject 0-20 Units into the skin 3 (three) times daily with meals. 10 mL 11 01/22/2017 at prn  . insulin aspart (NOVOLOG) 100 UNIT/ML injection Inject 0-5 Units into the skin at bedtime. 10 mL 11 01/22/2017 at 2100  . insulin glargine (LANTUS)  100 UNIT/ML injection Inject 0.72 mLs (72 Units total) into the skin at bedtime. (Patient taking differently: Inject 43 Units into the skin at bedtime. ) 10 mL 11 01/22/2017 at 2100  . iron polysaccharides (NIFEREX) 150 MG capsule Take 150 mg by mouth 3 (three) times daily.    01/22/2017 at 2100  . lamoTRIgine (LAMICTAL) 25 MG tablet Take 25 mg by mouth daily.   01/22/2017 at 0900  . levETIRAcetam (KEPPRA) 250 MG tablet Take 1 tablet (250 mg total) by mouth 2 (two) times daily. 30 tablet 0 01/22/2017 at 2100  . metoCLOPramide (REGLAN) 5 MG tablet Take 1 tablet (5 mg total) by mouth every 8 (eight) hours as needed for nausea or vomiting. 90 tablet 1 prn at prn  . metoprolol (LOPRESSOR) 50 MG tablet Take 50 mg by mouth 2 (two) times daily.   01/22/2017 at 2100  . nystatin (MYCOSTATIN/NYSTOP) powder Apply topically 3 (three) times daily. 15  g 0 prn at prn  . OLANZapine zydis (ZYPREXA) 5 MG disintegrating tablet Take 1 tablet (5 mg total) by mouth at bedtime. (Patient taking differently: Take 10 mg by mouth at bedtime. ) 30 tablet 0 01/22/2017 at 2000  . omeprazole (PRILOSEC) 40 MG capsule Take 40 mg by mouth 2 (two) times daily.   01/22/2017 at 0900  . ondansetron (ZOFRAN ODT) 4 MG disintegrating tablet Take 1 tablet (4 mg total) by mouth every 8 (eight) hours as needed for nausea or vomiting. 20 tablet 0 prn at prn  . oxybutynin (DITROPAN-XL) 10 MG 24 hr tablet Take 10 mg by mouth daily.   01/22/2017 at 0900  . pioglitazone (ACTOS) 30 MG tablet Take 30 mg by mouth daily.   01/22/2017 at 0900  . polyethylene glycol (MIRALAX / GLYCOLAX) packet Take 17 g by mouth daily.   01/22/2017 at 0900  . senna (SENOKOT) 8.6 MG TABS tablet Take 1 tablet (8.6 mg total) by mouth daily. 120 each 0 01/22/2017 at 0900  . traMADol (ULTRAM) 50 MG tablet Take 50 mg by mouth every 6 (six) hours as needed.   prn at prn  . vitamin E 1000 UNIT capsule Take 1,000 Units by mouth daily.   01/22/2017 at 0900  . gabapentin (NEURONTIN) 100 MG capsule Take 1 capsule (100 mg total) by mouth 3 (three) times daily as needed. (Patient not taking: Reported on 01/23/2017) 90 capsule 5 Not Taking at Unknown time  . metoprolol succinate (TOPROL-XL) 50 MG 24 hr tablet Take 1 tablet (50 mg total) by mouth 2 (two) times daily. Take with or immediately following a meal. (Patient not taking: Reported on 01/23/2017) 30 tablet 5 Not Taking at Unknown time  . VESICARE 10 MG tablet Take 1 tablet (10 mg total) by mouth daily. (Patient not taking: Reported on 01/23/2017) 90 tablet 3 Not Taking at Unknown time    Current medications: Current Facility-Administered Medications  Medication Dose Route Frequency Provider Last Rate Last Dose  . acetaminophen (TYLENOL) tablet 650 mg  650 mg Oral Q6H PRN Henreitta Leber, MD   650 mg at 01/30/17 2257   Or  . acetaminophen (TYLENOL) suppository 650 mg  650 mg  Rectal Q6H PRN Henreitta Leber, MD      . amLODipine (NORVASC) tablet 2.5 mg  2.5 mg Oral Daily Henreitta Leber, MD   2.5 mg at 01/31/17 0956  . atorvastatin (LIPITOR) tablet 20 mg  20 mg Oral Daily Henreitta Leber, MD   20 mg at 01/31/17 0957  .  benzonatate (TESSALON) capsule 100 mg  100 mg Oral TID PRN Henreitta Leber, MD      . budesonide (PULMICORT) nebulizer solution 0.5 mg  0.5 mg Nebulization BID Loletha Grayer, MD   0.5 mg at 01/31/17 2021  . cephALEXin (KEFLEX) capsule 500 mg  500 mg Oral Q12H Loletha Grayer, MD   500 mg at 01/31/17 2300  . citalopram (CELEXA) tablet 10 mg  10 mg Oral Daily Chauncey Mann, MD   10 mg at 01/31/17 0957  . desmopressin (DDAVP) tablet 0.2 mg  0.2 mg Oral QHS Dustin Flock, MD   0.2 mg at 01/31/17 2251  . docusate sodium (COLACE) capsule 100 mg  100 mg Oral BID Henreitta Leber, MD   100 mg at 01/31/17 2253  . enoxaparin (LOVENOX) injection 40 mg  40 mg Subcutaneous Q12H Dustin Flock, MD   40 mg at 02/01/17 0832  . feeding supplement (ENSURE ENLIVE) (ENSURE ENLIVE) liquid 237 mL  237 mL Oral TID BM Henreitta Leber, MD   237 mL at 01/31/17 1537  . fluticasone (FLONASE) 50 MCG/ACT nasal spray 2 spray  2 spray Each Nare Daily PRN Dustin Flock, MD      . guaiFENesin (MUCINEX) 12 hr tablet 600 mg  600 mg Oral BID Dustin Flock, MD   600 mg at 01/31/17 2251  . hydrOXYzine (ATARAX/VISTARIL) tablet 25 mg  25 mg Oral QHS Henreitta Leber, MD   25 mg at 01/31/17 2253  . insulin aspart (novoLOG) injection 0-20 Units  0-20 Units Subcutaneous TID WC Henreitta Leber, MD   4 Units at 02/01/17 1818  . insulin aspart (novoLOG) injection 0-5 Units  0-5 Units Subcutaneous QHS Henreitta Leber, MD   2 Units at 01/31/17 2250  . insulin glargine (LANTUS) injection 43 Units  43 Units Subcutaneous QHS Henreitta Leber, MD   43 Units at 01/31/17 2249  . ipratropium-albuterol (DUONEB) 0.5-2.5 (3) MG/3ML nebulizer solution 3 mL  3 mL Nebulization TID Loletha Grayer, MD   3 mL at  02/01/17 1440  . iron polysaccharides (NIFEREX) capsule 150 mg  150 mg Oral TID Henreitta Leber, MD   150 mg at 01/31/17 2253  . lamoTRIgine (LAMICTAL) tablet 25 mg  25 mg Oral Daily Henreitta Leber, MD   25 mg at 01/31/17 0954  . levETIRAcetam (KEPPRA) tablet 250 mg  250 mg Oral BID Henreitta Leber, MD   250 mg at 01/31/17 2253  . loratadine (CLARITIN) tablet 10 mg  10 mg Oral Daily Henreitta Leber, MD   10 mg at 01/31/17 0954  . LORazepam (ATIVAN) tablet 0.5 mg  0.5 mg Oral BID Chauncey Mann, MD   0.5 mg at 01/31/17 1800  . MEDLINE mouth rinse  15 mL Mouth Rinse BID Henreitta Leber, MD   15 mL at 01/31/17 1009  . metoCLOPramide (REGLAN) tablet 5 mg  5 mg Oral Q8H PRN Henreitta Leber, MD   5 mg at 01/28/17 1700  . metoprolol (LOPRESSOR) tablet 50 mg  50 mg Oral BID Henreitta Leber, MD   50 mg at 01/30/17 0918  . nystatin (MYCOSTATIN/NYSTOP) topical powder   Topical TID Henreitta Leber, MD      . OLANZapine zydis (ZYPREXA) disintegrating tablet 10 mg  10 mg Oral QHS Henreitta Leber, MD   10 mg at 01/31/17 2251  . ondansetron (ZOFRAN) tablet 4 mg  4 mg Oral Q6H PRN Henreitta Leber, MD  4 mg at 01/31/17 1829   Or  . ondansetron (ZOFRAN) injection 4 mg  4 mg Intravenous Q6H PRN Henreitta Leber, MD   4 mg at 02/01/17 1315  . oxybutynin (DITROPAN-XL) 24 hr tablet 10 mg  10 mg Oral Daily Henreitta Leber, MD   10 mg at 01/31/17 0954  . pantoprazole (PROTONIX) EC tablet 40 mg  40 mg Oral BID AC Dustin Flock, MD   40 mg at 01/31/17 1800  . polyethylene glycol (MIRALAX / GLYCOLAX) packet 17 g  17 g Oral Daily Henreitta Leber, MD   17 g at 01/31/17 0942  . senna (SENOKOT) tablet 8.6 mg  1 tablet Oral Daily Henreitta Leber, MD   8.6 mg at 01/31/17 0955  . sodium chloride flush (NS) 0.9 % injection 3 mL  3 mL Intravenous Q12H Henreitta Leber, MD   3 mL at 01/31/17 2255  . vitamin E capsule 1,000 Units  1,000 Units Oral Daily Henreitta Leber, MD   1,000 Units at 01/31/17 9892   Facility-Administered  Medications Ordered in Other Encounters  Medication Dose Route Frequency Provider Last Rate Last Dose  . 0.9 %  sodium chloride infusion    Continuous PRN Dionne Bucy, CRNA          Allergies: Allergies  Allergen Reactions  . Sulfa Antibiotics Hives  . Biaxin [Clarithromycin] Hives  . Influenza A (H1n1) Monoval Vac Other (See Comments)    Pt states that she was told by her MD not to get the influenza vaccine.    . Morphine Other (See Comments)    Reaction:  Dizziness and confusion   . Pyridium [Phenazopyridine Hcl] Other (See Comments)    Reaction:  Unknown   . Ceftriaxone Anxiety  . Latex Rash  . Prednisone Rash  . Tape Rash      Past Medical History: Past Medical History:  Diagnosis Date  . Anxiety   . Breast cancer ([place])    16 yrs ago  . CHF (congestive heart failure) ([place])   . Diabetes mellitus without complication ([place])   . DJD (degenerative joint disease)   . Dysuria   . H/O total knee replacement    right  . HTN (hypertension)   . Hypercholesteremia   . Kidney stone   . Obesity   . Recurrent urinary tract infection   . SVT (supraventricular tachycardia) ([place])   . Urinary incontinence   . Vaginal atrophy   . Yeast vaginitis      Past Surgical History: Past Surgical History:  Procedure Laterality Date  . CARDIAC CATHETERIZATION    . CHOLECYSTECTOMY    . FOOT SURGERY    . JOINT REPLACEMENT    . MASTECTOMY Right 1997  . NASAL SEPTUM SURGERY    . PERIPHERAL VASCULAR CATHETERIZATION N/A 07/08/2015   Procedure: PICC Line Insertion;  Surgeon: Algernon Huxley, MD;  Location: [facility];  Service: Cardiovascular;  Laterality: N/A;  . TONSILLECTOMY    . Vocal cords       Family History: Family History  Problem Relation Age of Onset  . Lung cancer Father   . Hematuria Mother   . Lung cancer Mother   . Kidney disease Neg Hx   . Bladder Cancer Neg Hx   . Breast cancer Neg Hx      Social History: Social History   Social History  .  Marital status: Widowed    Spouse name: N/A  . Number of children:  N/A  . Years of education: N/A   Occupational History  . Not on file.   Social History Main Topics  . Smoking status: Former Research scientist (life sciences)  . Smokeless tobacco: Former Systems developer    Quit date: 10/29/1995  . Alcohol use No  . Drug use: No  . Sexual activity: No   Other Topics Concern  . Not on file   Social History Narrative  . No narrative on file     Review of Systems: patient is not a reliable historian. Gen: denies fevers, chills HEENT: no complaints CV: dyspnea with exertion, dizziness upon standing Resp: shortness of breath off and on.  None at present ST:[id number] is low GU : urinary incontinence reported by nursing staff. Some time uses a bedside commode MS: has difficulty walking Derm:  no complaints Psych:no complaints at present Heme: no complaints Neuro: no complaints Endocrine.  No complaints at present  Vital Signs: Blood pressure (!) 142/49, pulse 82, temperature 97.9 F (36.6 C), temperature source Oral, resp. rate 19, height 5\' 7"  (1.702 m), weight 127 kg (280 lb), SpO2 98 %.   Intake/Output Summary (Last 24 hours) at 02/01/17 1835 Last data filed at 02/01/17 1300  Gross per 24 hour  Intake              600 ml  Output                0 ml  Net              600 ml    Weight trends: [employer]   01/23/17 0903  Weight: 127 kg (280 lb)    Physical Exam: General:  morbidly obese, laying in the bed  HEENT anicteric, moist oral mucous membranes  Neck:  supple, no masses  Lungs: Bilateral crackles at bases  Heart::  regular, no rub  Abdomen: Obese, soft, nontender  Extremities:  no pitting edema  Neurologic: Alert, oriented to self  Skin: No acute rashes             Lab results: Basic Metabolic Panel:  Recent Labs Lab 01/30/17 0507 01/31/17 0422 02/01/17 0444  NA 127* 129* 128*  K 5.0 4.8 4.8  CL 76* 75* 74*  CO2 45* 48* 48*  GLUCOSE 203* 213* 171*  BUN 36* 39* 37*   CREATININE 1.16* 1.17* 0.94  CALCIUM 9.6 9.2 9.4    Liver Function Tests: No results for input(s): AST, ALT, ALKPHOS, BILITOT, PROT, ALBUMIN in the last 168 hours. No results for input(s): LIPASE, AMYLASE in the last 168 hours. No results for input(s): AMMONIA in the last 168 hours.  CBC:  Recent Labs Lab 01/26/17 0800 02/01/17 0444  WBC 8.4 7.4  HGB 8.6* 7.5*  HCT 25.9* 22.6*  MCV 82.1 81.2  PLT 344 336    Cardiac Enzymes:  Recent Labs Lab 01/29/17 0526  TROPONINI <0.03    BNP: Invalid input(s): POCBNP  CBG:  Recent Labs Lab 01/31/17 1605 01/31/17 2117 02/01/17 0752 02/01/17 1141 02/01/17 1707  GLUCAP 185* 257* 155* 160* 155*    Microbiology: Recent Results (from the past 720 hour(s))  MRSA PCR Screening     Status: None   Collection Time: 01/23/17  8:56 AM  Result Value Ref Range Status   MRSA by PCR NEGATIVE NEGATIVE Final    Comment:        The GeneXpert MRSA Assay (FDA approved for NASAL specimens only), is one component of a comprehensive MRSA colonization surveillance program. It is not  intended to diagnose MRSA infection nor to guide or monitor treatment for MRSA infections.   Urine culture     Status: Abnormal   Collection Time: 01/30/17  5:07 AM  Result Value Ref Range Status   Specimen Description URINE, RANDOM  Final   Special Requests NONE  Final   Culture MULTIPLE SPECIES PRESENT, SUGGEST RECOLLECTION (A)  Final   Report Status 01/31/2017 FINAL  Final  Urine culture     Status: Abnormal   Collection Time: 01/31/17  3:42 PM  Result Value Ref Range Status   Specimen Description URINE, CLEAN CATCH  Final   Special Requests NONE  Final   Culture MULTIPLE SPECIES PRESENT, SUGGEST RECOLLECTION (A)  Final   Report Status 02/01/2017 FINAL  Final     Coagulation Studies: No results for input(s): LABPROT, INR in the last 72 hours.  Urinalysis:  Recent Labs  01/30/17 0827  COLORURINE YELLOW*  LABSPEC 1.012  PHURINE 6.0   GLUCOSEU NEGATIVE  HGBUR NEGATIVE  BILIRUBINUR NEGATIVE  KETONESUR NEGATIVE  PROTEINUR 100*  NITRITE NEGATIVE  LEUKOCYTESUR LARGE*        Imaging:  No results found.   Assessment & Plan: Pt is a 76 y.o. Caucasian female with Multiple medical problems of morbid obesity, diabetes, sleep apnea, history of congestive heart failure, chronic lower extremity edema, history of SVT, urinary incontinence ,history of kidney stone 17 mm in the right kidney December 2017 noted on CT was admitted on 01/23/2017 with shortness of breath.   1.  Hyponatremia, baseline sodium 136 noted on October 23, 2016 2.  Pyuria.  Urine culture showing multiple species 3.  Splenomegaly noted on CT.  14.2 cm.  IV contrast exposure for CT angiogram 01/24/2017 4.  Pleural effusion was bilaterally 5.  Acute Pulmonary edema, by exam and by chest x-ray 4/13  hyponatremia appears to be secondary to volume overload. DDAVP is likely contributing  Plan: Fluid restriction to 1200 cc per day Low-salt diet Low-dose Lasix Discontinue DDAVP and oxybutynin  We will follow

## 2017-02-01 NOTE — Care Management (Addendum)
Dr. Manuella Ghazi requested that RNCM attempt to reach out to patient's sister regarding discharge plan which I did. She was at home contact number. She plans for [facility] to [facility] to [facility] until she can arrange sitter services for patient (in two weeks at  Patient's address).  Home health has been arranged through [facility] and patient's sister agrees. She states she has tried multiple times today to call back Dr. Manuella Ghazi and CM however she states that "they would not accept the password I've been giving as 'sister' ". I have paged Dr. Manuella Ghazi so that he can call sister at home number. I have notified CSW and [facility] of patient's plan today. [facility] however that can be ordered by [facility]/PCP when she returns to home from ALF.

## 2017-02-01 NOTE — Progress Notes (Signed)
Inpatient Diabetes Program Recommendations  AACE/ADA: New Consensus Statement on Inpatient Glycemic Control (2015)  Target Ranges:  Prepandial:   less than 140 mg/dL      Peak postprandial:   less than 180 mg/dL (1-2 hours)      Critically ill patients:  140 - 180 mg/dL   Results for KERRY-Deira, MEZO (MRN [id number]) as of 02/01/2017 10:06  Ref. Range 01/31/2017 07:37 01/31/2017 11:21 01/31/2017 16:05 01/31/2017 21:17 02/01/2017 07:52  Glucose-Capillary Latest Ref Range: 65 - 99 mg/dL 168 (H) 188 (H) 185 (H) 257 (H) 155 (H)   Review of Glycemic Control  Current orders for Inpatient glycemic control: Lantus 43 units QHS, Novolog 0-20 units TID with meals, Novolog 0-5 units QHS  Inpatient Diabetes Program Recommendations: Insulin - Meal Coverage: If patient is eating at least 50% of meals, please consider ordering Novolog 3 units TID with meals for meal coverage. Diet: If appropriate, please consider changing Ensure Enlive to Glucerna supplements.  Thanks, Barnie Alderman, RN, MSN, CDE Diabetes Coordinator Inpatient Diabetes Program [phone] (Team Pager from 8am to 5pm)

## 2017-02-02 ENCOUNTER — Ambulatory Visit: Payer: Medicare Other | Admitting: Family

## 2017-02-02 DIAGNOSIS — J9621 Acute and chronic respiratory failure with hypoxia: Secondary | ICD-10-CM

## 2017-02-02 DIAGNOSIS — I509 Heart failure, unspecified: Secondary | ICD-10-CM

## 2017-02-02 DIAGNOSIS — Z7189 Other specified counseling: Secondary | ICD-10-CM

## 2017-02-02 DIAGNOSIS — Z515 Encounter for palliative care: Secondary | ICD-10-CM

## 2017-02-02 LAB — BLOOD GAS, ARTERIAL
Acid-Base Excess: 23 mmol/L — ABNORMAL HIGH (ref 0.0–2.0)
BICARBONATE: 50.8 mmol/L — AB (ref 20.0–28.0)
O2 Saturation: 98.1 %
PATIENT TEMPERATURE: 37
PH ART: 7.39 (ref 7.350–7.450)
pCO2 arterial: 84 mmHg (ref 32.0–48.0)
pO2, Arterial: 107 mmHg (ref 83.0–108.0)

## 2017-02-02 LAB — HEPATIC FUNCTION PANEL
ALT: 10 U/L — ABNORMAL LOW (ref 14–54)
AST: 19 U/L (ref 15–41)
Albumin: 2.9 g/dL — ABNORMAL LOW (ref 3.5–5.0)
Alkaline Phosphatase: 57 U/L (ref 38–126)
Bilirubin, Direct: <0.1 mg/dL — ABNORMAL LOW (ref 0.1–0.5)
TOTAL PROTEIN: 6.5 g/dL (ref 6.5–8.1)
Total Bilirubin: 0.7 mg/dL (ref 0.3–1.2)

## 2017-02-02 LAB — CBC
HCT: 22.6 % — ABNORMAL LOW (ref 35.0–47.0)
HEMOGLOBIN: 7.5 g/dL — AB (ref 12.0–16.0)
MCH: 27 pg (ref 26.0–34.0)
MCHC: 33.2 g/dL (ref 32.0–36.0)
MCV: 81.4 fL (ref 80.0–100.0)
Platelets: 355 10*3/uL (ref 150–440)
RBC: 2.78 MIL/uL — AB (ref 3.80–5.20)
RDW: 15.7 % — ABNORMAL HIGH (ref 11.5–14.5)
WBC: 8.2 10*3/uL (ref 3.6–11.0)

## 2017-02-02 LAB — GLUCOSE, CAPILLARY
GLUCOSE-CAPILLARY: 201 mg/dL — AB (ref 65–99)
GLUCOSE-CAPILLARY: 187 mg/dL — AB (ref 65–99)
GLUCOSE-CAPILLARY: 200 mg/dL — AB (ref 65–99)
Glucose-Capillary: 222 mg/dL — ABNORMAL HIGH (ref 65–99)

## 2017-02-02 LAB — BASIC METABOLIC PANEL
Anion gap: 8 (ref 5–15)
BUN: 29 mg/dL — AB (ref 6–20)
CHLORIDE: 73 mmol/L — AB (ref 101–111)
CO2: 44 mmol/L — ABNORMAL HIGH (ref 22–32)
CREATININE: 1.19 mg/dL — AB (ref 0.44–1.00)
Calcium: 9.2 mg/dL (ref 8.9–10.3)
GFR calc Af Amer: 51 mL/min — ABNORMAL LOW (ref >60)
GFR calc non Af Amer: 44 mL/min — ABNORMAL LOW (ref >60)
Glucose, Bld: 192 mg/dL — ABNORMAL HIGH (ref 65–99)
POTASSIUM: 4.6 mmol/L (ref 3.5–5.1)
Sodium: 125 mmol/L — ABNORMAL LOW (ref 135–145)

## 2017-02-02 LAB — CHLORIDE, URINE, RANDOM

## 2017-02-02 LAB — OSMOLALITY, URINE: Osmolality, Ur: 276 mOsm/kg — ABNORMAL LOW (ref 300–900)

## 2017-02-02 LAB — PROTEIN / CREATININE RATIO, URINE
CREATININE, URINE: 35 mg/dL
PROTEIN CREATININE RATIO: 2.43 mg/mg{creat} — AB (ref 0.00–0.15)
TOTAL PROTEIN, URINE: 85 mg/dL

## 2017-02-02 LAB — SODIUM, URINE, RANDOM: Sodium, Ur: 55 mmol/L

## 2017-02-02 NOTE — Progress Notes (Signed)
PT Cancellation Note  Patient Details Name: LOLAMAE VOISIN MRN: [id number] DOB: 1941-08-15   Cancelled Treatment:    Reason Eval/Treat Not Completed: Patient declined, no reason specified.  PT received call from nursing relaying concerns regarding pt's limited mobility during hospital stay (pt with multiple refusals to participate in PT).  Upon entering pt's room, pt initially agreeable to PT session but upon trying to initiate pt mobilizing OOB, pt suddenly refusing any PT.  Pt stating that she was told to take a "nap".  Pt educated on importance of mobility during hospital stay and concerns regarding her limited mobility so far, but pt adamant about needing to take a nap and refused all attempts of mobility/treatment.  Nursing notified and attempted to get pt to participate as well.  Will re-attempt PT treatment at a later date/time but therapy has attempted to see pt 3 days in a row now and has not been able to facilitate a therapy session.  Leitha Bleak, PT 02/02/17, 3:49 PM [phone]

## 2017-02-02 NOTE — Plan of Care (Signed)
Problem: Activity: Goal: Risk for activity intolerance will decrease Outcome: Progressing Pt was able to sit on the edge of the bed today for dinner.

## 2017-02-02 NOTE — Plan of Care (Signed)
Problem: Nutrition: Goal: Adequate nutrition will be maintained Outcome: Not Progressing Pt doesn't have much of an appetite. Refuses the Ensure's.

## 2017-02-02 NOTE — Consult Note (Signed)
Consultation Note Date: 02/02/2017   Patient Name: Carolyn Valdez  DOB: December 01, 1940  MRN: [id number]  Age / Sex: 76 y.o., female  PCP: Leonel Ramsay, MD Referring Physician: Max Sane, MD  Reason for Consultation: Establishing goals of care  HPI/Patient Profile: 76 y.o. female  with past medical history of depression, DM, diastolic CHF, breast cancer, anxiety, SVT, urinary incontinence, HTN, hypercholesteremia, and obesity admitted on 01/23/2017 with shortness of breath. Hypoxic in the 70's and sent to ED. Chest xray suggestive of volume overload and mild CHF. Acute on chronic CHF receiving lasix. Acute on chronic respiratory failure with hypoxia and hypercarbia-BiPAP while sleeping. UTI on UA--receiving Keflex. Hyponatremia 125--nephrology following. Mood disorder with history of catatonia and severe depression on Zyprexa. Palliative medicine consultation for goals of care.    Clinical Assessment and Goals of Care: I have reviewed medical records, discussed with Dr. Manuella Ghazi, and met with patient at bedside. She easily arouses to voice. Denies pain or discomfort. Answers few questions but drowsy.  Called sister, [name] via telephone to discuss diagnosis, [place], EOL wishes, disposition and options. Patient known to this NP from previous admissions.   Introduced Palliative Medicine as specialized medical care for people living with serious illness. It focuses on providing relief from the symptoms and stress of a serious illness. The goal is to improve quality of life for both the patient and the family.  After last admission, patient was discharged to rehab and then assisted living facility. Sister tells me she has been receiving home health aid/physical therapy. She is able to ambulate with walker but unable to perform majority of ADL's without assist. [name] states "she is very lazy" and that "staff really needs to  motivate her to participate with physical therapy." [name] speaks of her psych medications greatly helping her mood. Per sister, the patient has a great appetite.   Discussed hospital diagnoses and interventions. Educated on trajectory of CHF. Educated on medications that were stopped by nephrology due to hyponatremia and need for fluid/salt restriction in diet. Also, the need to wear BiPAP while sleeping.   Advanced directives, concepts specific to code status, and artifical feeding and hydration were discussed. [name] is HCPOA. She confirms DNR/DNI and no feeding tube. Her sister has spoken of her wishes many times.  Palliative Care services outpatient were explained and offered. POA agreeable with palliative services following on discharge. She is most concerned about financial burdens of assisted living facility and that she does not qualify for Medicaid at this time.   Questions and concerns were addressed. Emotional and spiritual support provided. Sister unable to visit the hospital tomorrow because her husband is being discharged home from rehab.     SUMMARY OF RECOMMENDATIONS    DNR/DNI  Continue current interventions.   Plan is likely back to ALF.  HCPOA agreeable with palliative services to follow outpatient.   PMT will continue to support patient/family through hospitalization.   Code Status/Advance Care Planning:  DNR   Symptom Management:   Per attending  Palliative  Prophylaxis:   Aspiration, Delirium Protocol, Frequent Pain Assessment, Oral Care and Turn Reposition  Psycho-social/Spiritual:   Desire for further Chaplaincy support:no  Additional Recommendations: Caregiving  Support/Resources and Education on Hospice  Prognosis:   Unable to determine  Discharge Planning: To Be Determined      Primary Diagnoses: Present on Admission: . Acute on chronic respiratory failure with hypoxia ([place])   I have reviewed the medical record, interviewed the  patient and family, and examined the patient. The following aspects are pertinent.  Past Medical History:  Diagnosis Date  . Anxiety   . Breast cancer ([place])    16 yrs ago  . CHF (congestive heart failure) ([place])   . Diabetes mellitus without complication ([place])   . DJD (degenerative joint disease)   . Dysuria   . H/O total knee replacement    right  . HTN (hypertension)   . Hypercholesteremia   . Kidney stone   . Obesity   . Recurrent urinary tract infection   . SVT (supraventricular tachycardia) ([place])   . Urinary incontinence   . Vaginal atrophy   . Yeast vaginitis    Social History   Social History  . Marital status: Widowed    Spouse name: N/A  . Number of children: N/A  . Years of education: N/A   Social History Main Topics  . Smoking status: Former Research scientist (life sciences)  . Smokeless tobacco: Former Systems developer    Quit date: 10/29/1995  . Alcohol use No  . Drug use: No  . Sexual activity: No   Other Topics Concern  . None   Social History Narrative  . None   Family History  Problem Relation Age of Onset  . Lung cancer Father   . Hematuria Mother   . Lung cancer Mother   . Kidney disease Neg Hx   . Bladder Cancer Neg Hx   . Breast cancer Neg Hx    Scheduled Meds: . atorvastatin  20 mg Oral Daily  . budesonide (PULMICORT) nebulizer solution  0.5 mg Nebulization BID  . cephALEXin  500 mg Oral Q12H  . citalopram  10 mg Oral Daily  . docusate sodium  100 mg Oral BID  . enoxaparin (LOVENOX) injection  40 mg Subcutaneous Q12H  . feeding supplement (ENSURE ENLIVE)  237 mL Oral TID BM  . furosemide  20 mg Oral Daily  . guaiFENesin  600 mg Oral BID  . hydrOXYzine  25 mg Oral QHS  . insulin aspart  0-20 Units Subcutaneous TID WC  . insulin aspart  0-5 Units Subcutaneous QHS  . insulin glargine  43 Units Subcutaneous QHS  . ipratropium-albuterol  3 mL Nebulization TID  . iron polysaccharides  150 mg Oral TID  . lamoTRIgine  25 mg Oral Daily  . levETIRAcetam  250 mg Oral BID  .  loratadine  10 mg Oral Daily  . LORazepam  0.5 mg Oral BID  . mouth rinse  15 mL Mouth Rinse BID  . metoprolol  50 mg Oral BID  . nystatin   Topical TID  . OLANZapine zydis  10 mg Oral QHS  . pantoprazole  40 mg Oral BID AC  . polyethylene glycol  17 g Oral Daily  . senna  1 tablet Oral Daily  . sodium chloride flush  3 mL Intravenous Q12H  . vitamin E  1,000 Units Oral Daily   Continuous Infusions: PRN Meds:.acetaminophen **OR** acetaminophen, benzonatate, fluticasone, metoCLOPramide, ondansetron **OR** ondansetron (ZOFRAN) IV Medications Prior to Admission:  Prior  to Admission medications   Medication Sig Start Date End Date Taking? Authorizing Provider  amLODipine (NORVASC) 10 MG tablet Take 1 tablet (10 mg total) by mouth daily. Patient taking differently: Take 2.5 mg by mouth daily.  10/26/16  Yes Epifanio Lesches, MD  atorvastatin (LIPITOR) 20 MG tablet Take 20 mg by mouth daily.    Yes Historical Provider, MD  benzonatate (TESSALON) 100 MG capsule Take 100 mg by mouth 3 (three) times daily as needed for cough.   Yes Historical Provider, MD  conjugated estrogens (PREMARIN) vaginal cream Place 1 Applicatorful vaginally 3 (three) times a week. Pt uses on Monday, Wednesday, and Friday.   Yes Historical Provider, MD  desmopressin (DDAVP) 0.2 MG tablet Take 1 tablet (0.2 mg total) by mouth at bedtime. 11/05/15  Yes Shannon A McGowan, PA-C  docusate sodium (COLACE) 100 MG capsule Take 100 mg by mouth 2 (two) times daily.    Yes Historical Provider, MD  feeding supplement, ENSURE ENLIVE, (ENSURE ENLIVE) LIQD Take 237 mLs by mouth 3 (three) times daily between meals. 10/26/16  Yes Epifanio Lesches, MD  fexofenadine (ALLEGRA) 180 MG tablet Take 180 mg by mouth daily.   Yes Historical Provider, MD  fluticasone (FLONASE) 50 MCG/ACT nasal spray Place 2 sprays into the nose as needed. 03/04/16  Yes Historical Provider, MD  furosemide (LASIX) 40 MG tablet Take 40 mg by mouth daily.   Yes  Historical Provider, MD  hydrOXYzine (ATARAX/VISTARIL) 25 MG tablet Take 1 tablet by mouth at bedtime.  07/05/16  Yes Historical Provider, MD  insulin aspart (NOVOLOG) 100 UNIT/ML injection Inject 0-20 Units into the skin 3 (three) times daily with meals. 09/05/16  Yes Epifanio Lesches, MD  insulin aspart (NOVOLOG) 100 UNIT/ML injection Inject 0-5 Units into the skin at bedtime. 09/05/16  Yes Epifanio Lesches, MD  insulin glargine (LANTUS) 100 UNIT/ML injection Inject 0.72 mLs (72 Units total) into the skin at bedtime. Patient taking differently: Inject 43 Units into the skin at bedtime.  10/26/16  Yes Epifanio Lesches, MD  iron polysaccharides (NIFEREX) 150 MG capsule Take 150 mg by mouth 3 (three) times daily.    Yes Historical Provider, MD  lamoTRIgine (LAMICTAL) 25 MG tablet Take 25 mg by mouth daily.   Yes Historical Provider, MD  levETIRAcetam (KEPPRA) 250 MG tablet Take 1 tablet (250 mg total) by mouth 2 (two) times daily. 10/26/16  Yes Epifanio Lesches, MD  metoCLOPramide (REGLAN) 5 MG tablet Take 1 tablet (5 mg total) by mouth every 8 (eight) hours as needed for nausea or vomiting. 09/29/16  Yes Theodoro Grist, MD  metoprolol (LOPRESSOR) 50 MG tablet Take 50 mg by mouth 2 (two) times daily.   Yes Historical Provider, MD  nystatin (MYCOSTATIN/NYSTOP) powder Apply topically 3 (three) times daily. 09/05/16  Yes Epifanio Lesches, MD  OLANZapine zydis (ZYPREXA) 5 MG disintegrating tablet Take 1 tablet (5 mg total) by mouth at bedtime. Patient taking differently: Take 10 mg by mouth at bedtime.  10/26/16  Yes Epifanio Lesches, MD  omeprazole (PRILOSEC) 40 MG capsule Take 40 mg by mouth 2 (two) times daily.   Yes Historical Provider, MD  ondansetron (ZOFRAN ODT) 4 MG disintegrating tablet Take 1 tablet (4 mg total) by mouth every 8 (eight) hours as needed for nausea or vomiting. 09/11/16  Yes Harvest Dark, MD  oxybutynin (DITROPAN-XL) 10 MG 24 hr tablet Take 10 mg by mouth daily.    Yes Historical Provider, MD  pioglitazone (ACTOS) 30 MG tablet Take 30 mg by mouth  daily.   Yes Historical Provider, MD  polyethylene glycol (MIRALAX / GLYCOLAX) packet Take 17 g by mouth daily.   Yes Historical Provider, MD  senna (SENOKOT) 8.6 MG TABS tablet Take 1 tablet (8.6 mg total) by mouth daily. 09/30/16  Yes Theodoro Grist, MD  traMADol (ULTRAM) 50 MG tablet Take 50 mg by mouth every 6 (six) hours as needed.   Yes Historical Provider, MD  vitamin E 1000 UNIT capsule Take 1,000 Units by mouth daily.   Yes Historical Provider, MD  gabapentin (NEURONTIN) 100 MG capsule Take 1 capsule (100 mg total) by mouth 3 (three) times daily as needed. Patient not taking: Reported on 01/23/2017 09/29/16   Theodoro Grist, MD  metoprolol succinate (TOPROL-XL) 50 MG 24 hr tablet Take 1 tablet (50 mg total) by mouth 2 (two) times daily. Take with or immediately following a meal. Patient not taking: Reported on 01/23/2017 09/29/16   Theodoro Grist, MD  VESICARE 10 MG tablet Take 1 tablet (10 mg total) by mouth daily. Patient not taking: Reported on 01/23/2017 11/05/15   Nori Riis, PA-C   Allergies  Allergen Reactions  . Sulfa Antibiotics Hives  . Biaxin [Clarithromycin] Hives  . Influenza A (H1n1) Monoval Vac Other (See Comments)    Pt states that she was told by her MD not to get the influenza vaccine.    . Morphine Other (See Comments)    Reaction:  Dizziness and confusion   . Pyridium [Phenazopyridine Hcl] Other (See Comments)    Reaction:  Unknown   . Ceftriaxone Anxiety  . Latex Rash  . Prednisone Rash  . Tape Rash   Review of Systems  Unable to perform ROS: Mental status change   Physical Exam  Constitutional: She is easily aroused. She appears ill.  HENT:  Head: Normocephalic and atraumatic.  Cardiovascular: Regular rhythm.   Pulmonary/Chest: Effort normal. She has decreased breath sounds.  Abdominal: Normal appearance.  Musculoskeletal: She exhibits edema (generalized).    Neurological: She is alert and easily aroused.  Pleasantly confused  Skin: Skin is warm and dry. There is pallor.  Psychiatric: Her speech is delayed. She is slowed. She is inattentive.  Nursing note and vitals reviewed.  Vital Signs: BP (!) 123/38 (BP Location: Left Arm)   Pulse 65   Temp 98.2 F (36.8 C) (Oral)   Resp (!) 22   Ht _0  (1.702 m)   Wt 127 kg (280 lb)   SpO2 94%   BMI 43.85 kg/m  Pain Assessment: No/denies pain   Pain Score: 0-No pain  SpO2: SpO2: 94 % O2 Device:SpO2: 94 % O2 Flow Rate: .O2 Flow Rate (L/min): 3 L/min  IO: Intake/output summary:   Intake/Output Summary (Last 24 hours) at 02/02/17 1449 Last data filed at 02/02/17 1429  Gross per 24 hour  Intake              818 ml  Output              150 ml  Net              668 ml    LBM: Last BM Date: 02/02/17 Baseline Weight: Weight: 127 kg (280 lb) Most recent weight: Weight: 127 kg (280 lb)     Palliative Assessment/Data: PPS 40%   Flowsheet Rows     Most Recent Value  Intake Tab  Referral Department  Hospitalist  Unit at Time of Referral  Cardiac/Telemetry Unit  Palliative Care Primary Diagnosis  Cardiac  Date Notified  01/30/17  Palliative Care Type  New Palliative care  Reason for referral  Clarify Goals of Care  Date of Admission  01/23/17  Date first seen by Palliative Care  02/02/17  # of days IP prior to Palliative referral  7  Clinical Assessment  Palliative Performance Scale Score  40%  Psychosocial & Spiritual Assessment  Palliative Care Outcomes  Patient/Family meeting held?  Yes  Who was at the meeting?  patient and sister  Palliative Care Outcomes  Clarified goals of care, Provided psychosocial or spiritual support, ACP counseling assistance, Linked to palliative care logitudinal support      Time In: 1400 Time Out: 1510 Time Total: 68mn Greater than 50%  of this time was spent counseling and coordinating care related to the above assessment and plan.  Signed  by:  MIhor Dow FNP-C Palliative Medicine Team  Phone: [phone]: [phone]  Please contact Palliative Medicine Team phone at [phone] questions and concerns.  For individual provider: See AShea Evans

## 2017-02-02 NOTE — Progress Notes (Signed)
Subjective:   States she feels tired Na 125 today K 4.6 Sister at bedside Some SOB  Objective:  Vital signs in last 24 hours:  Temp:  [98.2 F (36.8 C)-99.2 F (37.3 C)] 98.2 F (36.8 C) (04/18 1420) Pulse Rate:  [65-90] 65 (04/18 1420) Resp:  [20-22] 22 (04/18 1420) BP: (123-147)/(38-42) 123/38 (04/18 1420) SpO2:  [89 %-96 %] 94 % (04/18 1437)  Weight change:  Filed Weights   01/23/17 0903  Weight: 127 kg (280 lb)    Intake/Output:    Intake/Output Summary (Last 24 hours) at 02/02/17 1655 Last data filed at 02/02/17 1429  Gross per 24 hour  Intake              818 ml  Output              150 ml  Net              668 ml     Physical Exam: General: Obese female, laying in bed  HEENT Anicteric, moist mucus membranes  Neck supple  Pulm/lungs Mild crackles at bases  CVS/Heart 2/6 murmur, regular  Abdomen:  Soft, obese, NT  Extremities: No pitting edema  Neurologic: Alert, oreinted  Skin: No acute rashes          Basic Metabolic Panel:   Recent Labs Lab 01/29/17 0526 01/30/17 0507 01/31/17 0422 02/01/17 0444 02/02/17 0440  NA 127* 127* 129* 128* 125*  K 5.4* 5.0 4.8 4.8 4.6  CL 79* 76* 75* 74* 73*  CO2 42* 45* 48* 48* 44*  GLUCOSE 161* 203* 213* 171* 192*  BUN 36* 36* 39* 37* 29*  CREATININE 1.13* 1.16* 1.17* 0.94 1.19*  CALCIUM 9.4 9.6 9.2 9.4 9.2     CBC:  Recent Labs Lab 02/01/17 0444 02/02/17 0440  WBC 7.4 8.2  HGB 7.5* 7.5*  HCT 22.6* 22.6*  MCV 81.2 81.4  PLT 336 355      Microbiology:  Recent Results (from the past 720 hour(s))  MRSA PCR Screening     Status: None   Collection Time: 01/23/17  8:56 AM  Result Value Ref Range Status   MRSA by PCR NEGATIVE NEGATIVE Final    Comment:        The GeneXpert MRSA Assay (FDA approved for NASAL specimens only), is one component of a comprehensive MRSA colonization surveillance program. It is not intended to diagnose MRSA infection nor to guide or monitor treatment  for MRSA infections.   Urine culture     Status: Abnormal   Collection Time: 01/30/17  5:07 AM  Result Value Ref Range Status   Specimen Description URINE, RANDOM  Final   Special Requests NONE  Final   Culture MULTIPLE SPECIES PRESENT, SUGGEST RECOLLECTION (A)  Final   Report Status 01/31/2017 FINAL  Final  Urine culture     Status: Abnormal   Collection Time: 01/31/17  3:42 PM  Result Value Ref Range Status   Specimen Description URINE, CLEAN CATCH  Final   Special Requests NONE  Final   Culture MULTIPLE SPECIES PRESENT, SUGGEST RECOLLECTION (A)  Final   Report Status 02/01/2017 FINAL  Final    Coagulation Studies: No results for input(s): LABPROT, INR in the last 72 hours.  Urinalysis: No results for input(s): COLORURINE, LABSPEC, PHURINE, GLUCOSEU, HGBUR, BILIRUBINUR, KETONESUR, PROTEINUR, UROBILINOGEN, NITRITE, LEUKOCYTESUR in the last 72 hours.  Invalid input(s): APPERANCEUR    Imaging: No results found.   Medications:    . atorvastatin  20  mg Oral Daily  . budesonide (PULMICORT) nebulizer solution  0.5 mg Nebulization BID  . cephALEXin  500 mg Oral Q12H  . citalopram  10 mg Oral Daily  . docusate sodium  100 mg Oral BID  . enoxaparin (LOVENOX) injection  40 mg Subcutaneous Q12H  . feeding supplement (ENSURE ENLIVE)  237 mL Oral TID BM  . furosemide  20 mg Oral Daily  . guaiFENesin  600 mg Oral BID  . hydrOXYzine  25 mg Oral QHS  . insulin aspart  0-20 Units Subcutaneous TID WC  . insulin aspart  0-5 Units Subcutaneous QHS  . insulin glargine  43 Units Subcutaneous QHS  . ipratropium-albuterol  3 mL Nebulization TID  . iron polysaccharides  150 mg Oral TID  . lamoTRIgine  25 mg Oral Daily  . levETIRAcetam  250 mg Oral BID  . loratadine  10 mg Oral Daily  . LORazepam  0.5 mg Oral BID  . mouth rinse  15 mL Mouth Rinse BID  . metoprolol  50 mg Oral BID  . nystatin   Topical TID  . OLANZapine zydis  10 mg Oral QHS  . pantoprazole  40 mg Oral BID AC  .  polyethylene glycol  17 g Oral Daily  . senna  1 tablet Oral Daily  . sodium chloride flush  3 mL Intravenous Q12H  . vitamin E  1,000 Units Oral Daily   acetaminophen **OR** acetaminophen, benzonatate, fluticasone, metoCLOPramide, ondansetron **OR** ondansetron (ZOFRAN) IV  Assessment/ Plan:  76 y.o. female with Multiple medical problems of morbid obesity, diabetes, sleep apnea, history of congestive heart failure, chronic lower extremity edema, history of SVT, urinary incontinence ,history of kidney stone 17 mm in the right kidney December 2017 noted on CT was admitted on 01/23/2017 with shortness of breath.   1.  Hyponatremia, baseline sodium 136 noted on October 23, 2016 2.  Pyuria.  Urine culture showing multiple species 3.  Splenomegaly noted on CT.  14.2 cm.  IV contrast exposure for CT angiogram 01/24/2017 4.  Pleural effusion was bilaterally 5.  Acute Pulmonary edema, by exam and by chest x-ray 4/13  hyponatremia appears to be secondary to volume overload. DDAVP is likely contributing  Plan: Fluid restriction to 1200 cc per day Low-salt diet Low-dose Lasix Discontinue DDAVP and oxybutynin  We will follow   LOS: 10 Kush Farabee 4/18/20184:55 PM

## 2017-02-02 NOTE — Progress Notes (Signed)
RN attempted to talk with pt's sister, but sister wasn't in the room when RN came to update. Pt's sister has since left the hospital.

## 2017-02-02 NOTE — Progress Notes (Signed)
Patient ID: Carolyn Valdez, female   DOB: 20-Aug-1941, 76 y.o.   MRN: [id number]  [facility] PROGRESS NOTE  Carolyn Valdez AYT:[id number] DOB: 01/14/1941 DOA: 01/23/2017 PCP: Leonel Ramsay, MD  HPI/Subjective: Very sleepy, not participating with physical therapy, sodium dropping, somewhat lethargic  Objective: Vitals:   02/02/17 0503 02/02/17 1420  BP: (!) 128/42 (!) 123/38  Pulse: 79 65  Resp: 20 (!) 22  Temp: 99.2 F (37.3 C) 98.2 F (36.8 C)    Filed Weights   01/23/17 0903  Weight: 127 kg (280 lb)    ROS: Review of Systems  Unable to perform ROS: Acuity of condition  Respiratory: Positive for shortness of breath. Negative for cough.   Cardiovascular: Negative for chest pain.  Gastrointestinal: Negative for abdominal pain.  Genitourinary: Positive for dysuria.  Musculoskeletal: Negative for joint pain.   Exam: Physical Exam  HENT:  Nose: No mucosal edema.  Mouth/Throat: No oropharyngeal exudate or posterior oropharyngeal edema.  Eyes: Conjunctivae, EOM and lids are normal. Pupils are equal, round, and reactive to light.  Neck: No JVD present. Carotid bruit is not present. No edema present. No thyroid mass and no thyromegaly present.  Cardiovascular: S1 normal and S2 normal.  Exam reveals no gallop.   No murmur heard. Pulses:      Dorsalis pedis pulses are 2+ on the right side, and 2+ on the left side.  Respiratory: No respiratory distress. She has decreased breath sounds in the right upper field, the right middle field, the right lower field, the left upper field, the left middle field and the left lower field. She has no wheezes. She has no rhonchi. She has rales in the right lower field and the left lower field.  GI: Soft. Bowel sounds are normal. There is no tenderness.  Musculoskeletal:       Right knee: She exhibits no swelling.       Left knee: She exhibits no swelling.       Right ankle: She exhibits no swelling.       Left ankle: She exhibits no  swelling.  Lymphadenopathy:    She has no cervical adenopathy.  Skin: Skin is warm. No rash noted. Nails show no clubbing.  Psychiatric: She has a normal mood and affect.  sleepy/lethargy  Data Reviewed: Basic Metabolic Panel:  Recent Labs Lab 01/29/17 0526 01/30/17 0507 01/31/17 0422 02/01/17 0444 02/02/17 0440  NA 127* 127* 129* 128* 125*  K 5.4* 5.0 4.8 4.8 4.6  CL 79* 76* 75* 74* 73*  CO2 42* 45* 48* 48* 44*  GLUCOSE 161* 203* 213* 171* 192*  BUN 36* 36* 39* 37* 29*  CREATININE 1.13* 1.16* 1.17* 0.94 1.19*  CALCIUM 9.4 9.6 9.2 9.4 9.2   CBC:  Recent Labs Lab 02/01/17 0444 02/02/17 0440  WBC 7.4 8.2  HGB 7.5* 7.5*  HCT 22.6* 22.6*  MCV 81.2 81.4  PLT 336 355   Cardiac Enzymes:  Recent Labs Lab 01/28/17 1403 01/28/17 2109 01/29/17 0526  TROPONINI <0.03 <0.03 <0.03   BNP (last 3 results)  Recent Labs  02/23/16 1617 08/09/16 1550 08/31/16 2145  BNP 181.0* 311.0* 163.0*     CBG:  Recent Labs Lab 02/01/17 1707 02/01/17 2057 02/02/17 0723 02/02/17 1116 02/02/17 1632  GLUCAP 155* 138* 222* 201* 187*    Recent Results (from the past 240 hour(s))  Urine culture     Status: Abnormal   Collection Time: 01/30/17  5:07 AM  Result Value Ref Range Status  Specimen Description URINE, RANDOM  Final   Special Requests NONE  Final   Culture MULTIPLE SPECIES PRESENT, SUGGEST RECOLLECTION (A)  Final   Report Status 01/31/2017 FINAL  Final  Urine culture     Status: Abnormal   Collection Time: 01/31/17  3:42 PM  Result Value Ref Range Status   Specimen Description URINE, CLEAN CATCH  Final   Special Requests NONE  Final   Culture MULTIPLE SPECIES PRESENT, SUGGEST RECOLLECTION (A)  Final   Report Status 02/01/2017 FINAL  Final     Scheduled Meds: . atorvastatin  20 mg Oral Daily  . budesonide (PULMICORT) nebulizer solution  0.5 mg Nebulization BID  . cephALEXin  500 mg Oral Q12H  . citalopram  10 mg Oral Daily  . docusate sodium  100 mg Oral  BID  . enoxaparin (LOVENOX) injection  40 mg Subcutaneous Q12H  . feeding supplement (ENSURE ENLIVE)  237 mL Oral TID BM  . furosemide  20 mg Oral Daily  . guaiFENesin  600 mg Oral BID  . hydrOXYzine  25 mg Oral QHS  . insulin aspart  0-20 Units Subcutaneous TID WC  . insulin aspart  0-5 Units Subcutaneous QHS  . insulin glargine  43 Units Subcutaneous QHS  . ipratropium-albuterol  3 mL Nebulization TID  . iron polysaccharides  150 mg Oral TID  . lamoTRIgine  25 mg Oral Daily  . levETIRAcetam  250 mg Oral BID  . loratadine  10 mg Oral Daily  . LORazepam  0.5 mg Oral BID  . mouth rinse  15 mL Mouth Rinse BID  . metoprolol  50 mg Oral BID  . nystatin   Topical TID  . OLANZapine zydis  10 mg Oral QHS  . pantoprazole  40 mg Oral BID AC  . polyethylene glycol  17 g Oral Daily  . senna  1 tablet Oral Daily  . sodium chloride flush  3 mL Intravenous Q12H  . vitamin E  1,000 Units Oral Daily    Assessment/Plan:  1. Acute on chronic respiratory failure with hypoxia and hypercarbia: continue BiPAP while sleeping. Need to have her more on the hypoxic side and avoid Ventimask if possible. Maximum 3 L nasal cannula. continue nebulizer treatments with budesonide. 2. Hyponatremia: Na 125 today - Appreciate nephrology input -Hyponatremia appears to be secondary to volume overload. DDAVP is likely contributing - Fluid restriction to 1200 cc per day - Lasix 20 mg once a day orally - Discontinue DDAVP and oxybutynin  3. UTI: based on UA. get urine culture. continue Keflex.  Tried 2 different times for urine culture, but not able to get a clean catch and it is growing multiple species 4. Acute on chronic diastolic congestive heart failure: Low-dose lasix for now 5. Hyperkalemia: Resolved 6. Type 2 diabetes mellitus. On sliding scale and insulin. Discontinued Actos 7. Seizure history on Keppra and lamotrigine 8. 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 Hyperlipidemia unspecified on atorvastatin 10. Weakness. Physical therapy evaluation, patient is continuing to refuse physical therapy 11. Essential hypertension on Norvasc and metoprolol   Code Status: DO NOT RESUSCITATE Disposition Plan: likely back to homeplace when medically stable, likely long stay in the hospital  Antibiotics:  Keflex  Discussed with care management and nursing  Time spent: 26 minutes  [place]

## 2017-02-02 NOTE — Progress Notes (Signed)
Nutrition Brief Note  Patient identified on the Malnutrition Screening Tool (MST) Report  Wt Readings from Last 15 Encounters:  01/23/17 280 lb (127 kg)  10/20/16 254 lb (115.2 kg)  09/11/16 290 lb (131.5 kg)  09/05/16 288 lb (130.6 kg)  08/09/16 (!) 318 lb 1.6 oz (144.3 kg)  02/23/16 (!) 317 lb (143.8 kg)  12/31/15 (!) 305 lb (138.3 kg)  11/24/15 (!) 306 lb 8 oz (139 kg)  11/05/15 292 lb 1.6 oz (132.5 kg)  10/07/15 293 lb (132.9 kg)  09/05/15 (!) 315 lb (142.9 kg)  05/08/15 (!) 319 lb 9.6 oz (145 kg)  04/17/15 (!) 320 lb 6.4 oz (145.3 kg)  03/13/15 (!) 318 lb 9 oz (144.5 kg)    Body mass index is 43.85 kg/m. Patient meets criteria for obese based on current BMI.   Current diet order is heart healthy/carb mod, patient is consuming approximately 100% of meals at this time. Labs and medications reviewed.   Exhibits weight fluctuations related to edema but otherwise is ok.  No nutrition interventions warranted at this time. If nutrition issues arise, please consult RD.   Satira Anis. Reni Hausner, MS, RD LDN Inpatient Clinical Dietitian Pager [phone]

## 2017-02-02 NOTE — Discharge Instructions (Signed)
[facility] Failure [facility] appointment on February 10, 2017 at 12:20pm with Darylene Price, [place]. Please call [phone] to reschedule.

## 2017-02-03 LAB — GLUCOSE, CAPILLARY
Glucose-Capillary: 117 mg/dL — ABNORMAL HIGH (ref 65–99)
Glucose-Capillary: 107 mg/dL — ABNORMAL HIGH (ref 65–99)
Glucose-Capillary: 101 mg/dL — ABNORMAL HIGH (ref 65–99)
Glucose-Capillary: 145 mg/dL — ABNORMAL HIGH (ref 65–99)

## 2017-02-03 LAB — PROTEIN ELECTRO, RANDOM URINE
Albumin ELP, Urine: 63 %
Alpha-1-Globulin, U: 7.7 %
Alpha-2-Globulin, U: 6.6 %
Beta Globulin, U: 15.7 %
Gamma Globulin, U: 7 %
Total Protein, Urine: 72.1 mg/dL

## 2017-02-03 LAB — HEMOGLOBIN: Hemoglobin: 8.2 g/dL — ABNORMAL LOW (ref 12.0–16.0)

## 2017-02-03 LAB — TSH: TSH: 6.645 u[IU]/mL — ABNORMAL HIGH (ref 0.350–4.500)

## 2017-02-03 LAB — BASIC METABOLIC PANEL WITH GFR
Anion gap: 5 (ref 5–15)
BUN: 30 mg/dL — ABNORMAL HIGH (ref 6–20)
CO2: 47 mmol/L — ABNORMAL HIGH (ref 22–32)
Calcium: 9.3 mg/dL (ref 8.9–10.3)
Chloride: 76 mmol/L — ABNORMAL LOW (ref 101–111)
Creatinine, Ser: 1.09 mg/dL — ABNORMAL HIGH (ref 0.44–1.00)
GFR calc Af Amer: 57 mL/min — ABNORMAL LOW (ref >60)
GFR calc non Af Amer: 49 mL/min — ABNORMAL LOW (ref >60)
Glucose, Bld: 128 mg/dL — ABNORMAL HIGH (ref 65–99)
Potassium: 4.2 mmol/L (ref 3.5–5.1)
Sodium: 128 mmol/L — ABNORMAL LOW (ref 135–145)

## 2017-02-03 LAB — CORTISOL: Cortisol, Plasma: 11.4 ug/dL

## 2017-02-03 LAB — PREPARE RBC (CROSSMATCH)

## 2017-02-03 MED ORDER — FUROSEMIDE 10 MG/ML IJ SOLN
40.0000 mg | Freq: Once | INTRAMUSCULAR | Status: AC
  Administered 2017-02-03: 40 mg via INTRAVENOUS
  Filled 2017-02-03: qty 4

## 2017-02-03 MED ORDER — SODIUM CHLORIDE 0.9 % IV SOLN
Freq: Once | INTRAVENOUS | Status: AC
  Administered 2017-02-03: 18:00:00 via INTRAVENOUS

## 2017-02-03 MED ORDER — NITROFURANTOIN MONOHYD MACRO 100 MG PO CAPS
100.0000 mg | ORAL_CAPSULE | Freq: Every day | ORAL | Status: DC
  Filled 2017-02-03: qty 1

## 2017-02-03 MED ORDER — FUROSEMIDE 40 MG PO TABS
40.0000 mg | ORAL_TABLET | Freq: Two times a day (BID) | ORAL | Status: DC
  Administered 2017-02-04: 40 mg via ORAL
  Filled 2017-02-03: qty 1

## 2017-02-03 NOTE — Progress Notes (Signed)
Bipap is not in room pt is refusing. Pt is not in distress

## 2017-02-03 NOTE — Progress Notes (Signed)
PT Cancellation Note  Patient Details Name: Carolyn Valdez MRN: [id number] DOB: 26-Nov-1940   Cancelled Treatment:    Reason Eval/Treat Not Completed: Fatigue/lethargy limiting ability to participate.  Pt sleeping in bed upon entry and very difficult to wake up; pt would briefly open her eyes after vc's and tactile cues to wake her but then would quickly fall back asleep.  O2 93% on 3 L and HR 68 bpm.  Nursing immediately notified regarding pt's status and nursing reporting she would check on pt.  Will re-attempt PT treatment at a later date/time.  Leitha Bleak, PT 02/03/17, 10:34 AM [phone]

## 2017-02-03 NOTE — Progress Notes (Signed)
Patient ID: Carolyn Valdez, female   DOB: 08/24/1941, 76 y.o.   MRN: [id number]  [facility] PROGRESS NOTE  Carolyn Valdez BJS:[id number] DOB: 07-06-1941 DOA: 01/23/2017 PCP: Leonel Ramsay, MD  HPI/Subjective: Patient awakened with a sternal rub. She states she feels tired and wiped out. She states that she hasn't eaten.  Objective: Vitals:   02/03/17 1000 02/03/17 1200  BP:  (!) 144/62  Pulse:  77  Resp: 20 19  Temp:  97.9 F (36.6 C)    Filed Weights   01/23/17 0903  Weight: 127 kg (280 lb)    ROS: Review of Systems  Constitutional: Negative for chills and fever.  Eyes: Negative for blurred vision.  Respiratory: Positive for shortness of breath. Negative for cough.   Cardiovascular: Negative for chest pain.  Gastrointestinal: Negative for abdominal pain, constipation, diarrhea, nausea and vomiting.  Genitourinary: Positive for dysuria.  Musculoskeletal: Negative for joint pain.  Neurological: Negative for dizziness and headaches.   Exam: Physical Exam  HENT:  Nose: No mucosal edema.  Mouth/Throat: No oropharyngeal exudate or posterior oropharyngeal edema.  Eyes: EOM and lids are normal. Pupils are equal, round, and reactive to light.  Conjunctiva pale  Neck: No JVD present. Carotid bruit is not present. No edema present. No thyroid mass and no thyromegaly present.  Cardiovascular: S1 normal and S2 normal.  Exam reveals no gallop.   No murmur heard. Pulses:      Dorsalis pedis pulses are 2+ on the right side, and 2+ on the left side.  Respiratory: No respiratory distress. She has decreased breath sounds in the right lower field and the left lower field. She has no wheezes. She has no rhonchi. She has no rales.  GI: Soft. Bowel sounds are normal. There is no tenderness.  Musculoskeletal:       Right ankle: She exhibits swelling.       Left ankle: She exhibits swelling.  Lymphadenopathy:    She has no cervical adenopathy.  Neurological: She is alert.  Skin: Skin  is warm. No rash noted. Nails show no clubbing.  Skin pale  Psychiatric: Her affect is blunt.      Data Reviewed: Basic Metabolic Panel:  Recent Labs Lab 01/30/17 0507 01/31/17 0422 02/01/17 0444 02/02/17 0440 02/03/17 0551  NA 127* 129* 128* 125* 128*  K 5.0 4.8 4.8 4.6 4.2  CL 76* 75* 74* 73* 76*  CO2 45* 48* 48* 44* 47*  GLUCOSE 203* 213* 171* 192* 128*  BUN 36* 39* 37* 29* 30*  CREATININE 1.16* 1.17* 0.94 1.19* 1.09*  CALCIUM 9.6 9.2 9.4 9.2 9.3   Liver Function Tests:  Recent Labs Lab 02/02/17 0440  AST 19  ALT 10*  ALKPHOS 57  BILITOT 0.7  PROT 6.5  ALBUMIN 2.9*   CBC:  Recent Labs Lab 02/01/17 0444 02/02/17 0440 02/03/17 1133  WBC 7.4 8.2  --   HGB 7.5* 7.5* 8.2*  HCT 22.6* 22.6*  --   MCV 81.2 81.4  --   PLT 336 355  --    Cardiac Enzymes:  Recent Labs Lab 01/28/17 1403 01/28/17 2109 01/29/17 0526  TROPONINI <0.03 <0.03 <0.03   BNP (last 3 results)  Recent Labs  02/23/16 1617 08/09/16 1550 08/31/16 2145  BNP 181.0* 311.0* 163.0*    CBG:  Recent Labs Lab 02/02/17 1116 02/02/17 1632 02/02/17 2120 02/03/17 0726 02/03/17 1147  GLUCAP 201* 187* 200* 117* 107*    Recent Results (from the past 240 hour(s))  Urine  culture     Status: Abnormal   Collection Time: 01/30/17  5:07 AM  Result Value Ref Range Status   Specimen Description URINE, RANDOM  Final   Special Requests NONE  Final   Culture MULTIPLE SPECIES PRESENT, SUGGEST RECOLLECTION (A)  Final   Report Status 01/31/2017 FINAL  Final  Urine culture     Status: Abnormal   Collection Time: 01/31/17  3:42 PM  Result Value Ref Range Status   Specimen Description URINE, CLEAN CATCH  Final   Special Requests NONE  Final   Culture MULTIPLE SPECIES PRESENT, SUGGEST RECOLLECTION (A)  Final   Report Status 02/01/2017 FINAL  Final     Scheduled Meds: . atorvastatin  20 mg Oral Daily  . budesonide (PULMICORT) nebulizer solution  0.5 mg Nebulization BID  . citalopram  10 mg  Oral Daily  . docusate sodium  100 mg Oral BID  . enoxaparin (LOVENOX) injection  40 mg Subcutaneous Q12H  . feeding supplement (ENSURE ENLIVE)  237 mL Oral TID BM  . furosemide  40 mg Intravenous Once  . furosemide  40 mg Oral BID  . guaiFENesin  600 mg Oral BID  . hydrOXYzine  25 mg Oral QHS  . insulin aspart  0-20 Units Subcutaneous TID WC  . insulin aspart  0-5 Units Subcutaneous QHS  . insulin glargine  43 Units Subcutaneous QHS  . ipratropium-albuterol  3 mL Nebulization TID  . iron polysaccharides  150 mg Oral TID  . lamoTRIgine  25 mg Oral Daily  . levETIRAcetam  250 mg Oral BID  . loratadine  10 mg Oral Daily  . LORazepam  0.5 mg Oral BID  . mouth rinse  15 mL Mouth Rinse BID  . metoprolol  50 mg Oral BID  . [START ON 02/04/2017] nitrofurantoin (macrocrystal-monohydrate)  100 mg Oral Daily  . nystatin   Topical TID  . OLANZapine zydis  10 mg Oral QHS  . pantoprazole  40 mg Oral BID AC  . polyethylene glycol  17 g Oral Daily  . senna  1 tablet Oral Daily  . sodium chloride flush  3 mL Intravenous Q12H  . vitamin E  1,000 Units Oral Daily   Continuous Infusions: . sodium chloride      Assessment/Plan:  1. Acute on chronic respiratory failure with hypoxia and hypercarbia. Recommend BiPAP while sleeping. Maximum per nasal cannula 3 L. Continue nebulizer treatments 2. Anemia. Last hemoglobin 7.5. Type and cross and transfuse 1 unit packed red blood cells today. 3. Hyponatremia. Has improved with discontinuing DDAVP and oxybutynin. On fluid restriction and Lasix. 4. UTI based on urinalysis. 2 different urine cultures showing mixed organisms. Patient received quite a bit of Levaquin and then Keflex. Switch back to Macrobid. 5. Acute on chronic diastolic congestive heart failure. On low-dose Lasix 6. Hyperkalemia which has resolved 7. Type 2 diabetes mellitus on sliding scale and insulin. Actos discontinued 8. History of seizure on Keppra and lamotrigine 9. Mood disorder and  catatonia on Zyprexa 10. Hyperlipidemia unspecified on atorvastatin 11. Weakness. Patient unable to do much with physical therapy 12. Essential hypertension on Norvasc and metoprolol  Code Status:     Code Status Orders        Start     Ordered   01/31/17 1618  Do not attempt resuscitation (DNR)  Continuous    Question Answer Comment  In the event of cardiac or respiratory ARREST Do not call a "code blue"   In the event of cardiac  or respiratory ARREST Do not perform Intubation, CPR, defibrillation or ACLS   In the event of cardiac or respiratory ARREST Use medication by any route, position, wound care, and other measures to relive pain and suffering. May use oxygen, suction and manual treatment of airway obstruction as needed for comfort.      01/31/17 1617    Code Status History    Date Active Date Inactive Code Status Order ID Comments User Context   01/23/2017  1:20 PM 01/31/2017  4:17 PM Full Code [id number]  Henreitta Leber, MD Inpatient   10/05/2016 12:16 PM 10/26/2016  5:40 PM DNR [id number]  Knox Royalty, NP Inpatient   09/24/2016  5:31 PM 10/05/2016 12:16 PM Full Code [id number]  Gladstone Lighter, MD Inpatient   09/01/2016  2:17 AM 09/05/2016  4:28 PM Full Code [id number]  Harvie Bridge, DO Inpatient   08/09/2016  8:27 PM 08/12/2016  8:15 PM Full Code [id number]  Henreitta Leber, MD Inpatient   09/30/2015  2:23 PM 10/07/2015  5:37 PM Full Code [id number]  Loletha Grayer, MD ED   09/05/2015  5:56 PM 09/09/2015  5:38 PM DNR [id number]  Loletha Grayer, MD ED    Advance Directive Documentation     Most Recent Value  Type of Advance Directive  Healthcare Power of [place], Living will  Pre-existing out of facility DNR order (yellow form or pink MOST form)  -  "MOST" Form in Place?  -     Disposition Plan: Potentially back to home Place tomorrow  Consultants:  Nephrology  Antibiotics:  Nitrofurantoin  Time spent: 25 minutes  Loletha Grayer  [employer]

## 2017-02-03 NOTE — Progress Notes (Signed)
Subjective:   Resting quietly Na 128 today K 4.2 No complaint reported  Objective:  Vital signs in last 24 hours:  Temp:  [97.6 F (36.4 C)-98.4 F (36.9 C)] 97.9 F (36.6 C) (04/19 1200) Pulse Rate:  [70-77] 77 (04/19 1200) Resp:  [19-22] 19 (04/19 1200) BP: (143-146)/(55-62) 144/62 (04/19 1200) SpO2:  [96 %-99 %] 99 % (04/19 1200)  Weight change:  Filed Weights   01/23/17 0903  Weight: 127 kg (280 lb)    Intake/Output:    Intake/Output Summary (Last 24 hours) at 02/03/17 1548 Last data filed at 02/03/17 0900  Gross per 24 hour  Intake              243 ml  Output                0 ml  Net              243 ml     Physical Exam: General: Obese female, laying in bed  HEENT Anicteric, moist mucus membranes  Neck supple  Pulm/lungs Mild crackles at bases  CVS/Heart 2/6 murmur, regular  Abdomen:  Soft, obese, NT  Extremities: No pitting edema  Neurologic: Alert, oreinted  Skin: No acute rashes          Basic Metabolic Panel:   Recent Labs Lab 01/30/17 0507 01/31/17 0422 02/01/17 0444 02/02/17 0440 02/03/17 0551  NA 127* 129* 128* 125* 128*  K 5.0 4.8 4.8 4.6 4.2  CL 76* 75* 74* 73* 76*  CO2 45* 48* 48* 44* 47*  GLUCOSE 203* 213* 171* 192* 128*  BUN 36* 39* 37* 29* 30*  CREATININE 1.16* 1.17* 0.94 1.19* 1.09*  CALCIUM 9.6 9.2 9.4 9.2 9.3     CBC:  Recent Labs Lab 02/01/17 0444 02/02/17 0440 02/03/17 1133  WBC 7.4 8.2  --   HGB 7.5* 7.5* 8.2*  HCT 22.6* 22.6*  --   MCV 81.2 81.4  --   PLT 336 355  --       Microbiology:  Recent Results (from the past 720 hour(s))  MRSA PCR Screening     Status: None   Collection Time: 01/23/17  8:56 AM  Result Value Ref Range Status   MRSA by PCR NEGATIVE NEGATIVE Final    Comment:        The GeneXpert MRSA Assay (FDA approved for NASAL specimens only), is one component of a comprehensive MRSA colonization surveillance program. It is not intended to diagnose MRSA infection nor to guide  or monitor treatment for MRSA infections.   Urine culture     Status: Abnormal   Collection Time: 01/30/17  5:07 AM  Result Value Ref Range Status   Specimen Description URINE, RANDOM  Final   Special Requests NONE  Final   Culture MULTIPLE SPECIES PRESENT, SUGGEST RECOLLECTION (A)  Final   Report Status 01/31/2017 FINAL  Final  Urine culture     Status: Abnormal   Collection Time: 01/31/17  3:42 PM  Result Value Ref Range Status   Specimen Description URINE, CLEAN CATCH  Final   Special Requests NONE  Final   Culture MULTIPLE SPECIES PRESENT, SUGGEST RECOLLECTION (A)  Final   Report Status 02/01/2017 FINAL  Final    Coagulation Studies: No results for input(s): LABPROT, INR in the last 72 hours.  Urinalysis: No results for input(s): COLORURINE, LABSPEC, PHURINE, GLUCOSEU, HGBUR, BILIRUBINUR, KETONESUR, PROTEINUR, UROBILINOGEN, NITRITE, LEUKOCYTESUR in the last 72 hours.  Invalid input(s): APPERANCEUR    Imaging:  No results found.   Medications:   . sodium chloride     . atorvastatin  20 mg Oral Daily  . budesonide (PULMICORT) nebulizer solution  0.5 mg Nebulization BID  . citalopram  10 mg Oral Daily  . docusate sodium  100 mg Oral BID  . enoxaparin (LOVENOX) injection  40 mg Subcutaneous Q12H  . feeding supplement (ENSURE ENLIVE)  237 mL Oral TID BM  . furosemide  40 mg Intravenous Once  . furosemide  20 mg Oral Daily  . guaiFENesin  600 mg Oral BID  . hydrOXYzine  25 mg Oral QHS  . insulin aspart  0-20 Units Subcutaneous TID WC  . insulin aspart  0-5 Units Subcutaneous QHS  . insulin glargine  43 Units Subcutaneous QHS  . ipratropium-albuterol  3 mL Nebulization TID  . iron polysaccharides  150 mg Oral TID  . lamoTRIgine  25 mg Oral Daily  . levETIRAcetam  250 mg Oral BID  . loratadine  10 mg Oral Daily  . LORazepam  0.5 mg Oral BID  . mouth rinse  15 mL Mouth Rinse BID  . metoprolol  50 mg Oral BID  . [START ON 02/04/2017] nitrofurantoin  (macrocrystal-monohydrate)  100 mg Oral Daily  . nystatin   Topical TID  . OLANZapine zydis  10 mg Oral QHS  . pantoprazole  40 mg Oral BID AC  . polyethylene glycol  17 g Oral Daily  . senna  1 tablet Oral Daily  . sodium chloride flush  3 mL Intravenous Q12H  . vitamin E  1,000 Units Oral Daily   acetaminophen **OR** acetaminophen, benzonatate, fluticasone, metoCLOPramide, ondansetron **OR** ondansetron (ZOFRAN) IV  Assessment/ Plan:  76 y.o. female with Multiple medical problems of morbid obesity, diabetes, sleep apnea, history of congestive heart failure, chronic lower extremity edema, history of SVT, urinary incontinence ,history of kidney stone 17 mm in the right kidney December 2017 noted on CT was admitted on 01/23/2017 with shortness of breath.   1.  Hyponatremia, baseline sodium 136 noted on October 23, 2016 2.  Pyuria.  Urine culture showing multiple species 3.  Splenomegaly noted on CT.  14.2 cm.  IV contrast exposure for CT angiogram 01/24/2017 4.  Pleural effusion was bilaterally 5.  Acute Pulmonary edema, by exam and by chest x-ray 4/13  hyponatremia appears to be secondary to volume overload. DDAVP is likely contributing  Plan: Fluid restriction to 1200 cc per day Low-salt diet Increase Lasix to 40 twice a day Discontinue DDAVP and oxybutynin  We will follow   LOS: 11 Jayron Maqueda 4/19/20183:48 PM

## 2017-02-04 LAB — BASIC METABOLIC PANEL
Anion gap: 5 (ref 5–15)
BUN: 25 mg/dL — AB (ref 6–20)
CHLORIDE: 81 mmol/L — AB (ref 101–111)
CO2: 47 mmol/L — ABNORMAL HIGH (ref 22–32)
Calcium: 9.5 mg/dL (ref 8.9–10.3)
Creatinine, Ser: 1.07 mg/dL — ABNORMAL HIGH (ref 0.44–1.00)
GFR calc Af Amer: 58 mL/min — ABNORMAL LOW (ref >60)
GFR calc non Af Amer: 50 mL/min — ABNORMAL LOW (ref >60)
Glucose, Bld: 138 mg/dL — ABNORMAL HIGH (ref 65–99)
POTASSIUM: 4.3 mmol/L (ref 3.5–5.1)
SODIUM: 133 mmol/L — AB (ref 135–145)

## 2017-02-04 LAB — TYPE AND SCREEN
ABO/RH(D): O POS
ANTIBODY SCREEN: NEGATIVE
Unit division: 0

## 2017-02-04 LAB — BPAM RBC
BLOOD PRODUCT EXPIRATION DATE: 201805022359
ISSUE DATE / TIME: 201804191731
UNIT TYPE AND RH: 5100

## 2017-02-04 LAB — PROTEIN ELECTROPHORESIS, SERUM
A/G Ratio: 0.8 (ref 0.7–1.7)
ALPHA-1-GLOBULIN: 0.3 g/dL (ref 0.0–0.4)
Albumin ELP: 2.5 g/dL — ABNORMAL LOW (ref 2.9–4.4)
Alpha-2-Globulin: 0.9 g/dL (ref 0.4–1.0)
BETA GLOBULIN: 0.9 g/dL (ref 0.7–1.3)
GAMMA GLOBULIN: 1.2 g/dL (ref 0.4–1.8)
Globulin, Total: 3.2 g/dL (ref 2.2–3.9)
TOTAL PROTEIN ELP: 5.7 g/dL — AB (ref 6.0–8.5)

## 2017-02-04 LAB — GLUCOSE, CAPILLARY: Glucose-Capillary: 156 mg/dL — ABNORMAL HIGH (ref 65–99)

## 2017-02-04 LAB — FERRITIN: FERRITIN: 94 ng/mL (ref 11–307)

## 2017-02-04 LAB — HEMOGLOBIN: HEMOGLOBIN: 8.7 g/dL — AB (ref 12.0–16.0)

## 2017-02-04 MED ORDER — TRAMADOL HCL 50 MG PO TABS: 50.0000 mg | ORAL_TABLET | Freq: Four times a day (QID) | ORAL | 0 refills | Status: DC | PRN

## 2017-02-04 MED ORDER — INSULIN GLARGINE 100 UNIT/ML ~~LOC~~ SOLN: 43.0000 [IU] | Freq: Every day | SUBCUTANEOUS | 0 refills | Status: DC

## 2017-02-04 MED ORDER — NITROFURANTOIN MONOHYD MACRO 100 MG PO CAPS: 100.0000 mg | ORAL_CAPSULE | Freq: Every day | ORAL | 0 refills | Status: DC

## 2017-02-04 MED ORDER — FUROSEMIDE 40 MG PO TABS: 40.0000 mg | ORAL_TABLET | Freq: Two times a day (BID) | ORAL | 0 refills | Status: DC

## 2017-02-04 MED ORDER — IPRATROPIUM-ALBUTEROL 0.5-2.5 (3) MG/3ML IN SOLN: 3.0000 mL | Freq: Three times a day (TID) | RESPIRATORY_TRACT | 0 refills | Status: DC

## 2017-02-04 MED ORDER — LORAZEPAM 0.5 MG PO TABS: 0.5000 mg | ORAL_TABLET | Freq: Two times a day (BID) | ORAL | 0 refills | Status: DC

## 2017-02-04 MED ORDER — CITALOPRAM HYDROBROMIDE 10 MG PO TABS: 10.0000 mg | ORAL_TABLET | Freq: Every day | ORAL | 0 refills | Status: AC

## 2017-02-04 MED ORDER — OLANZAPINE 10 MG PO TBDP: 10.0000 mg | ORAL_TABLET | Freq: Every day | ORAL | 0 refills | Status: DC

## 2017-02-04 NOTE — Progress Notes (Signed)
Subjective:   Resting quietly Na 133 today K 4.3 No complaints except generalized weakness  Objective:  Vital signs in last 24 hours:  Temp:  [98.1 F (36.7 C)-99.2 F (37.3 C)] 98.5 F (36.9 C) (04/20 1037) Pulse Rate:  [61-92] 65 (04/20 1037) Resp:  [18-20] 18 (04/20 0428) BP: (106-214)/(38-168) 126/38 (04/20 1037) SpO2:  [91 %-97 %] 95 % (04/20 1037)  Weight change:  Filed Weights   01/23/17 0903  Weight: 127 kg (280 lb)    Intake/Output:    Intake/Output Summary (Last 24 hours) at 02/04/17 1630 Last data filed at 02/03/17 2330  Gross per 24 hour  Intake              500 ml  Output                0 ml  Net              500 ml     Physical Exam: General: Obese female, laying in bed  HEENT Anicteric, moist mucus membranes  Neck supple  Pulm/lungs Mild crackles at bases  CVS/Heart 2/6 murmur, regular  Abdomen:  Soft, obese, NT  Extremities: No pitting edema  Neurologic: Alert, oreinted  Skin: No acute rashes          Basic Metabolic Panel:   Recent Labs Lab 01/31/17 0422 02/01/17 0444 02/02/17 0440 02/03/17 0551 02/04/17 0424  NA 129* 128* 125* 128* 133*  K 4.8 4.8 4.6 4.2 4.3  CL 75* 74* 73* 76* 81*  CO2 48* 48* 44* 47* 47*  GLUCOSE 213* 171* 192* 128* 138*  BUN 39* 37* 29* 30* 25*  CREATININE 1.17* 0.94 1.19* 1.09* 1.07*  CALCIUM 9.2 9.4 9.2 9.3 9.5     CBC:  Recent Labs Lab 02/01/17 0444 02/02/17 0440 02/03/17 1133 02/04/17 0424  WBC 7.4 8.2  --   --   HGB 7.5* 7.5* 8.2* 8.7*  HCT 22.6* 22.6*  --   --   MCV 81.2 81.4  --   --   PLT 336 355  --   --       Microbiology:  Recent Results (from the past 720 hour(s))  MRSA PCR Screening     Status: None   Collection Time: 01/23/17  8:56 AM  Result Value Ref Range Status   MRSA by PCR NEGATIVE NEGATIVE Final    Comment:        The GeneXpert MRSA Assay (FDA approved for NASAL specimens only), is one component of a comprehensive MRSA colonization surveillance program. It  is not intended to diagnose MRSA infection nor to guide or monitor treatment for MRSA infections.   Urine culture     Status: Abnormal   Collection Time: 01/30/17  5:07 AM  Result Value Ref Range Status   Specimen Description URINE, RANDOM  Final   Special Requests NONE  Final   Culture MULTIPLE SPECIES PRESENT, SUGGEST RECOLLECTION (A)  Final   Report Status 01/31/2017 FINAL  Final  Urine culture     Status: Abnormal   Collection Time: 01/31/17  3:42 PM  Result Value Ref Range Status   Specimen Description URINE, CLEAN CATCH  Final   Special Requests NONE  Final   Culture MULTIPLE SPECIES PRESENT, SUGGEST RECOLLECTION (A)  Final   Report Status 02/01/2017 FINAL  Final    Coagulation Studies: No results for input(s): LABPROT, INR in the last 72 hours.  Urinalysis: No results for input(s): COLORURINE, LABSPEC, [place], [place], [place], BILIRUBINUR, KETONESUR,  PROTEINUR, UROBILINOGEN, NITRITE, LEUKOCYTESUR in the last 72 hours.  Invalid input(s): APPERANCEUR    Imaging: No results found.   Medications:       Assessment/ Plan:  76 y.o. female with Multiple medical problems of morbid obesity, diabetes, sleep apnea, history of congestive heart failure, chronic lower extremity edema, history of SVT, urinary incontinence ,history of kidney stone 17 mm in the right kidney December 2017 noted on CT was admitted on 01/23/2017 with shortness of breath.   1.  Hyponatremia, baseline sodium 136 noted on October 23, 2016 2.  Pyuria.  Urine culture showing multiple species 3.  Splenomegaly noted on CT.  14.2 cm.  IV contrast exposure for CT angiogram 01/24/2017 4.  Pleural effusion was bilaterally 5.  Acute Pulmonary edema, by exam and by chest x-ray 4/13  hyponatremia appears to be secondary to volume overload. DDAVP is likely contributing  Plan: Fluid restriction to 1200 cc per day Low-salt diet continue Lasix  40 mg twice a day Discontinue DDAVP and oxybutynin  We will  follow   LOS: 12 Carolyn Valdez 4/20/20184:30 PM

## 2017-02-04 NOTE — Clinical Social Work Note (Signed)
Patient to discharge to return to [facility] today with home health. Caryl Pina at [facility] is aware and nurse to call report. Discharge information sent. Patient to go via EMS. Dr. Leslye Peer has spoken to patient's sister and she is aware and in agreement of discharge. Shela Leff MSW,LCSW [phone]

## 2017-02-04 NOTE — Care Management (Signed)
Patient returning to [facility] today.  Patient to discharge with home health services.  Tim from [facility] notified of discharge plan.  Order placed home O2, and nebulizer.  Patient already has Chronic O2 in place.  Jason with [facility] to deliver nebulizer prior to discharge.  RNCM signing off.

## 2017-02-04 NOTE — Discharge Summary (Signed)
[facility] [place] NAME: Carolyn Valdez    MR#:  [id number]  DATE OF BIRTH:  01-Sep-1941  DATE OF ADMISSION:  01/23/2017 ADMITTING PHYSICIAN: Henreitta Leber, MD  DATE OF DISCHARGE: 02/04/2017  PRIMARY CARE PHYSICIAN: Doctor at [facility]:  Acute on chronic congestive heart failure, unspecified congestive heart failure type [I50.9]  DISCHARGE DIAGNOSIS:  Principal Problem:   Adjustment disorder with anxiety Active Problems:   Acute on chronic respiratory failure with hypoxia (HCC)   SECONDARY DIAGNOSIS:   Past Medical History:  Diagnosis Date  . Anxiety   . Breast cancer ([place])    16 yrs ago  . CHF (congestive heart failure) ([place])   . Diabetes mellitus without complication ([place])   . DJD (degenerative joint disease)   . Dysuria   . H/O total knee replacement    right  . HTN (hypertension)   . Hypercholesteremia   . Kidney stone   . Obesity   . Recurrent urinary tract infection   . SVT (supraventricular tachycardia) ([place])   . Urinary incontinence   . Vaginal atrophy   . Yeast vaginitis     HOSPITAL COURSE:   1.  Acute on chronic respiratory failure with hypoxia and hypercarbia. The patient did not tolerate BiPAP here in the hospital. I would rather have the patient more hypoxic than dilating up the oxygen. Maximum oxygen 3 L. I am okay with a pulse ox of 86%. 2. Chronic anemia likely of chronic disease. Patient was given 1 unit of transfusion during the hospital course for hemoglobin of 7.5. Hemoglobin upon discharge 8.7 3. Hyponatremia. This has improved with discontinuing DDAVP and oxybutynin. Recommend fluid restriction and continuing Lasix 4. UTI based on 2 urine analysis. Each culture showing mixed organisms. The patient received Levaquin and then Keflex while here. Switch back to Macrobid to prevent urinary tract infections. 5. Acute on chronic diastolic congestive heart failure. On Lasix and  metoprolol 6. Hyperkalemia has resolved 7. Type 2 diabetes mellitus on Lantus and sliding scale. Actos discontinued 8. Mood disorder and catatonia on Zyprexa, Ativan and Celexa 9. History of seizure on Keppra and lamotrigine 10. Hyperlipidemia unspecified on atorvastatin 11. Essential hypertension on metoprolol 12. Weakness. Patient unable to do much with physical therapy will need home health physical therapy at facility. Overall prognosis is poor if patients do not get up and walk. Patient seen by palliative care. 13. Recommend palliative care team following at facility. Patient made a DO NOT RESUSCITATE by palliative care team 14. The very careful with tramadol for pain. Rather do Tylenol  DISCHARGE CONDITIONS:   High risk for readmission  CONSULTS OBTAINED:  Treatment Team:  Gonzella Lex, MD Murlean Iba, MD  DRUG ALLERGIES:   Allergies  Allergen Reactions  . Sulfa Antibiotics Hives  . Biaxin [Clarithromycin] Hives  . Influenza A (H1n1) Mon[place] Vac Other (See Comments)    Pt states that she was told by her MD not to get the influenza vaccine.    . Morphine Other (See Comments)    Reaction:  Dizziness and confusion   . Pyridium [Phenazopyridine Hcl] Other (See Comments)    Reaction:  Unknown   . Ceftriaxone Anxiety  . Latex Rash  . Prednisone Rash  . Tape Rash    DISCHARGE MEDICATIONS:   Current Discharge Medication List    START taking these medications   Details  citalopram (CELEXA) 10 MG tablet Take 1 tablet (10 mg  total) by mouth daily. Qty: 30 tablet, Refills: 0    ipratropium-albuterol (DUONEB) 0.5-2.5 (3) MG/3ML SOLN Take 3 mLs by nebulization 3 (three) times daily. Qty: 360 mL, Refills: 0    LORazepam (ATIVAN) 0.5 MG tablet Take 1 tablet (0.5 mg total) by mouth 2 (two) times daily. Qty: 60 tablet, Refills: 0    nitrofurantoin, macrocrystal-monohydrate, (MACROBID) 100 MG capsule Take 1 capsule (100 mg total) by mouth daily. Qty: 30 capsule, Refills:  0      CONTINUE these medications which have CHANGED   Details  furosemide (LASIX) 40 MG tablet Take 1 tablet (40 mg total) by mouth 2 (two) times daily. Qty: 60 tablet, Refills: 0    insulin glargine (LANTUS) 100 UNIT/ML injection Inject 0.43 mLs (43 Units total) into the skin at bedtime. Qty: 15 mL, Refills: 0    OLANZapine zydis (ZYPREXA) 10 MG disintegrating tablet Take 1 tablet (10 mg total) by mouth at bedtime. Qty: 30 tablet, Refills: 0    traMADol (ULTRAM) 50 MG tablet Take 1 tablet (50 mg total) by mouth every 6 (six) hours as needed. Qty: 12 tablet, Refills: 0      CONTINUE these medications which have NOT CHANGED   Details  atorvastatin (LIPITOR) 20 MG tablet Take 20 mg by mouth daily.     benzonatate (TESSALON) 100 MG capsule Take 100 mg by mouth 3 (three) times daily as needed for cough.    conjugated estrogens (PREMARIN) vaginal cream Place 1 Applicatorful vaginally 3 (three) times a week. Pt uses on Monday, Wednesday, and Friday.    docusate sodium (COLACE) 100 MG capsule Take 100 mg by mouth 2 (two) times daily.     feeding supplement, ENSURE ENLIVE, (ENSURE ENLIVE) LIQD Take 237 mLs by mouth 3 (three) times daily between meals. Qty: 237 mL, Refills: 12    fexofenadine (ALLEGRA) 180 MG tablet Take 180 mg by mouth daily.    fluticasone (FLONASE) 50 MCG/ACT nasal spray Place 2 sprays into the nose as needed.    hydrOXYzine (ATARAX/VISTARIL) 25 MG tablet Take 1 tablet by mouth at bedtime.     !! insulin aspart (NOVOLOG) 100 UNIT/ML injection Inject 0-20 Units into the skin 3 (three) times daily with meals. Qty: 10 mL, Refills: 11    !! insulin aspart (NOVOLOG) 100 UNIT/ML injection Inject 0-5 Units into the skin at bedtime. Qty: 10 mL, Refills: 11    iron polysaccharides (NIFEREX) 150 MG capsule Take 150 mg by mouth 3 (three) times daily.     lamoTRIgine (LAMICTAL) 25 MG tablet Take 25 mg by mouth daily.    levETIRAcetam (KEPPRA) 250 MG tablet Take 1 tablet  (250 mg total) by mouth 2 (two) times daily. Qty: 30 tablet, Refills: 0    metoCLOPramide (REGLAN) 5 MG tablet Take 1 tablet (5 mg total) by mouth every 8 (eight) hours as needed for nausea or vomiting. Qty: 90 tablet, Refills: 1    metoprolol (LOPRESSOR) 50 MG tablet Take 50 mg by mouth 2 (two) times daily.    nystatin (MYCOSTATIN/NYSTOP) powder Apply topically 3 (three) times daily. Qty: 15 g, Refills: 0    omeprazole (PRILOSEC) 40 MG capsule Take 40 mg by mouth 2 (two) times daily.    ondansetron (ZOFRAN ODT) 4 MG disintegrating tablet Take 1 tablet (4 mg total) by mouth every 8 (eight) hours as needed for nausea or vomiting. Qty: 20 tablet, Refills: 0    polyethylene glycol (MIRALAX / GLYCOLAX) packet Take 17 g by mouth daily.  senna (SENOKOT) 8.6 MG TABS tablet Take 1 tablet (8.6 mg total) by mouth daily. Qty: 120 each, Refills: 0    vitamin E 1000 UNIT capsule Take 1,000 Units by mouth daily.     !! - Potential duplicate medications found. Please discuss with provider.    STOP taking these medications     amLODipine (NORVASC) 10 MG tablet      desmopressin (DDAVP) 0.2 MG tablet      oxybutynin (DITROPAN-XL) 10 MG 24 hr tablet      pioglitazone (ACTOS) 30 MG tablet      gabapentin (NEURONTIN) 100 MG capsule      metoprolol succinate (TOPROL-XL) 50 MG 24 hr tablet      VESICARE 10 MG tablet          DISCHARGE INSTRUCTIONS:   Follow up with doctor at facility 3 days  follow-up at CHF clinic  If you experience worsening of your admission symptoms, develop shortness of breath, life threatening emergency, suicidal or homicidal thoughts you must seek medical attention immediately by calling 911 or calling your MD immediately  if symptoms less severe.  You Must read complete instructions/literature along with all the possible adverse reactions/side effects for all the Medicines you take and that have been prescribed to you. Take any new Medicines after you have  completely understood and accept all the possible adverse reactions/side effects.   Please note  You were cared for by a hospitalist during your hospital stay. If you have any questions about your discharge medications or the care you received while you were in the hospital after you are discharged, you can call the unit and asked to speak with the hospitalist on call if the hospitalist that took care of you is not available. Once you are discharged, your primary care physician will handle any further medical issues. Please note that NO REFILLS for any discharge medications will be authorized once you are discharged, as it is imperative that you return to your primary care physician (or establish a relationship with a primary care physician if you do not have one) for your aftercare needs so that they can reassess your need for medications and monitor your lab values.    Today   CHIEF COMPLAINT:   Chief Complaint  Patient presents with  . Shortness of Breath    HISTORY OF PRESENT ILLNESS:  Carolyn Valdez  is a 76 y.o. female presents with shortness of breath   VITAL SIGNS:  Blood pressure (!) 130/41, pulse 69, temperature 98.4 F (36.9 C), temperature source Oral, resp. rate 18, height 5\' 7"  (1.702 m), weight 127 kg (280 lb), SpO2 93 %.    PHYSICAL EXAMINATION:  GENERAL:  76 y.o.-year-old patient lying in the bed with no acute distress.  EYES: Pupils equal, round, reactive to light and accommodation. No scleral icterus. Extraocular muscles intact.  HEENT: Head atraumatic, normocephalic. Oropharynx and nasopharynx clear.  NECK:  Supple, no jugular venous distention. No thyroid enlargement, no tenderness.  LUNGS: Decreased breath sounds bilaterally, no wheezing, rales,rhonchi or crepitation. No use of accessory muscles of respiration.  CARDIOVASCULAR: S1, S2 normal. No murmurs, rubs, or gallops.  ABDOMEN: Soft, non-tender, non-distended. Bowel sounds present. No organomegaly or mass.   EXTREMITIES: 3+ edema, no cyanosis, or clubbing.  NEUROLOGIC: Cranial nerves II through XII are intact. Muscle strength 5/5 in all extremities. Sensation intact. Gait not checked.  PSYCHIATRIC: The patient is alert and oriented x 3.  SKIN: No obvious rash, lesion, or ulcer.  DATA REVIEW:   CBC  Recent Labs Lab 02/02/17 0440  02/04/17 0424  WBC 8.2  --   --   HGB 7.5*  < > 8.7*  HCT 22.6*  --   --   PLT 355  --   --   < > = values in this interval not displayed.  Chemistries   Recent Labs Lab 02/02/17 0440  02/04/17 0424  NA 125*  < > 133*  K 4.6  < > 4.3  CL 73*  < > 81*  CO2 44*  < > 47*  GLUCOSE 192*  < > 138*  BUN 29*  < > 25*  CREATININE 1.19*  < > 1.07*  CALCIUM 9.2  < > 9.5  AST 19  --   --   ALT 10*  --   --   ALKPHOS 57  --   --   BILITOT 0.7  --   --   < > = values in this interval not displayed.  Cardiac Enzymes  Recent Labs Lab 01/29/17 0526  TROPONINI <0.03    Microbiology Results  Results for orders placed or performed during the hospital encounter of 01/23/17  MRSA PCR Screening     Status: None   Collection Time: 01/23/17  8:56 AM  Result Value Ref Range Status   MRSA by PCR NEGATIVE NEGATIVE Final    Comment:        The GeneXpert MRSA Assay (FDA approved for NASAL specimens only), is one component of a comprehensive MRSA colonization surveillance program. It is not intended to diagnose MRSA infection nor to guide or monitor treatment for MRSA infections.   Urine culture     Status: Abnormal   Collection Time: 01/30/17  5:07 AM  Result Value Ref Range Status   Specimen Description URINE, RANDOM  Final   Special Requests NONE  Final   Culture MULTIPLE SPECIES PRESENT, SUGGEST RECOLLECTION (A)  Final   Report Status 01/31/2017 FINAL  Final  Urine culture     Status: Abnormal   Collection Time: 01/31/17  3:42 PM  Result Value Ref Range Status   Specimen Description URINE, CLEAN CATCH  Final   Special Requests NONE  Final    Culture MULTIPLE SPECIES PRESENT, SUGGEST RECOLLECTION (A)  Final   Report Status 02/01/2017 FINAL  Final    Management plans discussed with the patient, family and they are in agreement.  CODE STATUS:     Code Status Orders        Start     Ordered   01/31/17 1618  Do not attempt resuscitation (DNR)  Continuous    Question Answer Comment  In the event of cardiac or respiratory ARREST Do not call a "code blue"   In the event of cardiac or respiratory ARREST Do not perform Intubation, CPR, defibrillation or ACLS   In the event of cardiac or respiratory ARREST Use medication by any route, position, wound care, and other measures to relive pain and suffering. May use oxygen, suction and manual treatment of airway obstruction as needed for comfort.      01/31/17 1617    Code Status History    Date Active Date Inactive Code Status Order ID Comments User Context   01/23/2017  1:20 PM 01/31/2017  4:17 PM Full Code [id number]  Henreitta Leber, MD Inpatient   10/05/2016 12:16 PM 10/26/2016  5:40 PM DNR [id number]  Knox Royalty, NP Inpatient   09/24/2016  5:31 PM 10/05/2016 12:16 PM  Full Code [id number]  Gladstone Lighter, MD Inpatient   09/01/2016  2:17 AM 09/05/2016  4:28 PM Full Code [id number]  Harvie Bridge, DO Inpatient   08/09/2016  8:27 PM 08/12/2016  8:15 PM Full Code [id number]  Henreitta Leber, MD Inpatient   09/30/2015  2:23 PM 10/07/2015  5:37 PM Full Code [id number]  Loletha Grayer, MD ED   09/05/2015  5:56 PM 09/09/2015  5:38 PM DNR [id number]  Loletha Grayer, MD ED    Advance Directive Documentation     Most Recent Value  Type of Advance Directive  Healthcare Power of [place], Living will  Pre-existing out of facility DNR order (yellow form or pink MOST form)  -  "MOST" Form in Place?  -      TOTAL TIME TAKING CARE OF THIS PATIENT: 40 minutes.    Loletha Grayer M.D on 02/04/2017 at 9:08 AM  Between 7am to 6pm - Pager - [phone]  After 6pm go to [email or web address] -  Proofreader  [employer] Office  [phone]  CC: Primary care physician; Doctor at facility

## 2017-02-04 NOTE — Care Management Important Message (Signed)
Important Message  Patient Details  Name: Carolyn Valdez MRN: [id number] Date of Birth: Mar 23, 1941   Medicare Important Message Given:  Yes    Beverly Sessions, RN 02/04/2017, 10:19 AM

## 2017-02-04 NOTE — Progress Notes (Signed)
Patient discharged to [facility]. IV sites removed. Report called to  [facility], report given to EMS. EMS arrived and transported patient to facility. Patient smiled while leaving. No pain.

## 2017-02-04 NOTE — NC FL2 (Signed)
[place] LEVEL OF CARE SCREENING TOOL     IDENTIFICATION  Patient Name: Carolyn Valdez: 1941-01-04 Sex: female Admission Date (Current Location): 01/23/2017  [facility] and [place] Number:  Engineering geologist and Address:  [facility], [address] [place], [place] [address]      Provider Number: [id number]  Attending Physician Name and Address:  Loletha Grayer, MD  Relative Name and Phone Number:       Current Level of Care: Hospital Recommended Level of Care: [place] Prior Approval Number:    Date Approved/Denied:   PASRR Number:    Discharge Plan:  (aLF)    Current Diagnoses: Patient Active Problem List   Diagnosis Date Noted  . Adjustment disorder with anxiety 01/28/2017  . Acute on chronic respiratory failure with hypoxia ([place]) 01/23/2017  . Palliative care encounter   . Goals of care, counseling/discussion   . DNR (do not resuscitate) 10/05/2016  . Palliative care by specialist 10/05/2016  . Depression, major, recurrent, severe with psychosis ([place]) 10/05/2016  . Severe major depression, single episode, with psychotic features ([place]) 10/04/2016  . Cellulitis of leg, right 09/29/2016  . Proctitis 09/29/2016  . Hypercapnia 09/29/2016  . Acute urinary retention 09/29/2016  . UTI (urinary tract infection) 09/29/2016  . Weakness 09/29/2016  . Subacute delirium 09/28/2016  . Gastrointestinal hemorrhage   . Diffuse abdominal pain   . Altered mental status 09/24/2016  . Pressure injury of skin 09/02/2016  . Respiratory failure with hypoxia ([place]) 09/01/2016  . Lymphedema 08/16/2016  . Acute on chronic congestive heart failure ([place]) 08/09/2016  . Acquired lymphedema of leg 04/21/2016  . Congestive heart failure ([place]) 11/25/2015  . Nocturia 11/05/2015  . Urinary frequency 11/05/2015  . Acute respiratory failure with hypoxia ([place]) 09/05/2015  . 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 Incontinence 04/20/2015  .  Diabetes mellitus, type 2 ([place]) 04/17/2015  . ESBL (extended spectrum beta-lactamase) producing bacteria infection 04/17/2015  . BP (high blood pressure) 04/17/2015  . Frequent UTI 04/17/2015  . Absence of bladder continence 04/17/2015  . Iron deficiency anemia 03/08/2015  . Absolute anemia 09/25/2014  . Abdominal pain, lower 09/25/2014  . Urge incontinence 02/19/2013  . FOM (frequency of micturition) 02/19/2013  . Bladder infection, chronic 08/11/2012  . Difficult or painful urination 07/26/2012  . Lower urinary tract infection 12/31/2011  . Diabetes mellitus ([place]) 12/31/2011    Orientation RESPIRATION BLADDER Height & Weight     Self, Place, Situation  Normal Incontinent Weight: 280 lb (127 kg) Height:  5\' 7"  (170.2 cm)  BEHAVIORAL SYMPTOMS/MOOD NEUROLOGICAL BOWEL NUTRITION STATUS   (none)  (none) Continent Diet (heart healthy carb modified)  AMBULATORY STATUS COMMUNICATION OF NEEDS Skin   Extensive Assist Verbally Normal                       Personal Care Assistance Level of Assistance  Bathing, Dressing Bathing Assistance: Maximum assistance   Dressing Assistance: Maximum assistance     Functional Limitations Info   (no issues)          SPECIAL CARE FACTORS FREQUENCY  PT (By licensed PT) (Home Health To Follow)                    Contractures Contractures Info: Not present    Additional Factors Info  Code Status                 Current Medications (02/04/2017):  This is the current hospital active medication list Current Facility-Administered Medications  Medication Dose Route Frequency Provider Last Rate Last Dose  . acetaminophen (TYLENOL) tablet 650 mg  650 mg Oral Q6H PRN Henreitta Leber, MD   650 mg at 01/30/17 2257   Or  . acetaminophen (TYLENOL) suppository 650 mg  650 mg Rectal Q6H PRN Henreitta Leber, MD      . atorvastatin (LIPITOR) tablet 20 mg  20 mg Oral Daily Henreitta Leber, MD   20 mg at 02/02/17 9371  . benzonatate  (TESSALON) capsule 100 mg  100 mg Oral TID PRN Henreitta Leber, MD      . budesonide (PULMICORT) nebulizer solution 0.5 mg  0.5 mg Nebulization BID Loletha Grayer, MD   0.5 mg at 02/04/17 0820  . citalopram (CELEXA) tablet 10 mg  10 mg Oral Daily Chauncey Mann, MD   10 mg at 02/02/17 6967  . docusate sodium (COLACE) capsule 100 mg  100 mg Oral BID Henreitta Leber, MD   100 mg at 02/03/17 2205  . enoxaparin (LOVENOX) injection 40 mg  40 mg Subcutaneous Q12H Dustin Flock, MD   40 mg at 02/03/17 2206  . feeding supplement (ENSURE ENLIVE) (ENSURE ENLIVE) liquid 237 mL  237 mL Oral TID BM Henreitta Leber, MD   237 mL at 01/31/17 1537  . fluticasone (FLONASE) 50 MCG/ACT nasal spray 2 spray  2 spray Each Nare Daily PRN Dustin Flock, MD      . furosemide (LASIX) tablet 40 mg  40 mg Oral BID Murlean Iba, MD   40 mg at 02/04/17 0819  . guaiFENesin (MUCINEX) 12 hr tablet 600 mg  600 mg Oral BID Dustin Flock, MD   600 mg at 02/03/17 2205  . hydrOXYzine (ATARAX/VISTARIL) tablet 25 mg  25 mg Oral QHS Henreitta Leber, MD   25 mg at 02/03/17 2204  . insulin aspart (novoLOG) injection 0-20 Units  0-20 Units Subcutaneous TID WC Henreitta Leber, MD   4 Units at 02/04/17 [id number]  . insulin aspart (novoLOG) injection 0-5 Units  0-5 Units Subcutaneous QHS Henreitta Leber, MD   2 Units at 01/31/17 2250  . insulin glargine (LANTUS) injection 43 Units  43 Units Subcutaneous QHS Henreitta Leber, MD   43 Units at 02/02/17 2245  . ipratropium-albuterol (DUONEB) 0.5-2.5 (3) MG/3ML nebulizer solution 3 mL  3 mL Nebulization TID Loletha Grayer, MD   3 mL at 02/04/17 0820  . iron polysaccharides (NIFEREX) capsule 150 mg  150 mg Oral TID Henreitta Leber, MD   150 mg at 02/03/17 2205  . lamoTRIgine (LAMICTAL) tablet 25 mg  25 mg Oral Daily Henreitta Leber, MD   25 mg at 02/02/17 1213  . levETIRAcetam (KEPPRA) tablet 250 mg  250 mg Oral BID Henreitta Leber, MD   250 mg at 02/03/17 2206  . loratadine (CLARITIN) tablet 10 mg  10  mg Oral Daily Henreitta Leber, MD   10 mg at 02/02/17 1236  . LORazepam (ATIVAN) tablet 0.5 mg  0.5 mg Oral BID Chauncey Mann, MD   0.5 mg at 02/04/17 0819  . MEDLINE mouth rinse  15 mL Mouth Rinse BID Henreitta Leber, MD   15 mL at 01/31/17 1009  . metoCLOPramide (REGLAN) tablet 5 mg  5 mg Oral Q8H PRN Henreitta Leber, MD   5 mg at 01/28/17 1700  . metoprolol (LOPRESSOR) tablet 50 mg  50 mg Oral  BID Henreitta Leber, MD   50 mg at 02/03/17 2205  . nitrofurantoin (macrocrystal-monohydrate) (MACROBID) capsule 100 mg  100 mg Oral Daily Loletha Grayer, MD      . nystatin (MYCOSTATIN/NYSTOP) topical powder   Topical TID Henreitta Leber, MD      . OLANZapine zydis (ZYPREXA) disintegrating tablet 10 mg  10 mg Oral QHS Henreitta Leber, MD   10 mg at 02/03/17 2205  . ondansetron (ZOFRAN) tablet 4 mg  4 mg Oral Q6H PRN Henreitta Leber, MD   4 mg at 01/31/17 1829   Or  . ondansetron (ZOFRAN) injection 4 mg  4 mg Intravenous Q6H PRN Henreitta Leber, MD   4 mg at 02/02/17 0831  . pantoprazole (PROTONIX) EC tablet 40 mg  40 mg Oral BID AC Dustin Flock, MD   40 mg at 02/04/17 0818  . polyethylene glycol (MIRALAX / GLYCOLAX) packet 17 g  17 g Oral Daily Henreitta Leber, MD   17 g at 01/31/17 0942  . senna (SENOKOT) tablet 8.6 mg  1 tablet Oral Daily Henreitta Leber, MD   8.6 mg at 02/02/17 2023  . sodium chloride flush (NS) 0.9 % injection 3 mL  3 mL Intravenous Q12H Henreitta Leber, MD   3 mL at 02/03/17 2143  . vitamin E capsule 1,000 Units  1,000 Units Oral Daily Henreitta Leber, MD   1,000 Units at 02/02/17 1213   Facility-Administered Medications Ordered in Other Encounters  Medication Dose Route Frequency Provider Last Rate Last Dose  . 0.9 %  sodium chloride infusion    Continuous PRN Dionne Bucy, CRNA         Discharge Medications: Please see discharge summary for a list of discharge medications.  Relevant Imaging Results:  Relevant Lab Results:   Additional Information   Shela Leff, LCSW

## 2017-02-10 ENCOUNTER — Telehealth: Payer: Self-pay | Admitting: Family

## 2017-02-10 ENCOUNTER — Ambulatory Visit: Payer: Medicare Other | Admitting: Family

## 2017-02-10 NOTE — Telephone Encounter (Signed)
Patient missed her initial appointment at the [facility] on 02/10/17. Will attempt to reschedule.

## 2017-02-21 ENCOUNTER — Emergency Department: Payer: Medicare Other

## 2017-02-21 ENCOUNTER — Encounter: Payer: Self-pay | Admitting: Emergency Medicine

## 2017-02-21 ENCOUNTER — Inpatient Hospital Stay
Admission: EM | Admit: 2017-02-21 | Discharge: 2017-02-24 | DRG: 291 | Disposition: A | Discharge status: Skilled Nursing Facility | Payer: Medicare Other | Attending: Internal Medicine | Admitting: Internal Medicine

## 2017-02-21 DIAGNOSIS — Z79899 Other long term (current) drug therapy: Secondary | ICD-10-CM

## 2017-02-21 DIAGNOSIS — Z66 Do not resuscitate: Secondary | ICD-10-CM | POA: Diagnosis present

## 2017-02-21 DIAGNOSIS — J9622 Acute and chronic respiratory failure with hypercapnia: Secondary | ICD-10-CM | POA: Diagnosis present

## 2017-02-21 DIAGNOSIS — Z881 Allergy status to other antibiotic agents status: Secondary | ICD-10-CM | POA: Diagnosis not present

## 2017-02-21 DIAGNOSIS — F419 Anxiety disorder, unspecified: Secondary | ICD-10-CM | POA: Diagnosis present

## 2017-02-21 DIAGNOSIS — F329 Major depressive disorder, single episode, unspecified: Secondary | ICD-10-CM | POA: Diagnosis present

## 2017-02-21 DIAGNOSIS — Z888 Allergy status to other drugs, medicaments and biological substances status: Secondary | ICD-10-CM

## 2017-02-21 DIAGNOSIS — Z885 Allergy status to narcotic agent status: Secondary | ICD-10-CM

## 2017-02-21 DIAGNOSIS — J449 Chronic obstructive pulmonary disease, unspecified: Secondary | ICD-10-CM | POA: Diagnosis present

## 2017-02-21 DIAGNOSIS — K219 Gastro-esophageal reflux disease without esophagitis: Secondary | ICD-10-CM | POA: Diagnosis present

## 2017-02-21 DIAGNOSIS — J9621 Acute and chronic respiratory failure with hypoxia: Secondary | ICD-10-CM | POA: Diagnosis present

## 2017-02-21 DIAGNOSIS — G40909 Epilepsy, unspecified, not intractable, without status epilepticus: Secondary | ICD-10-CM | POA: Diagnosis present

## 2017-02-21 DIAGNOSIS — J9602 Acute respiratory failure with hypercapnia: Secondary | ICD-10-CM | POA: Diagnosis not present

## 2017-02-21 DIAGNOSIS — E119 Type 2 diabetes mellitus without complications: Secondary | ICD-10-CM | POA: Diagnosis present

## 2017-02-21 DIAGNOSIS — Z87891 Personal history of nicotine dependence: Secondary | ICD-10-CM | POA: Diagnosis not present

## 2017-02-21 DIAGNOSIS — Z96651 Presence of right artificial knee joint: Secondary | ICD-10-CM | POA: Diagnosis present

## 2017-02-21 DIAGNOSIS — J9601 Acute respiratory failure with hypoxia: Secondary | ICD-10-CM | POA: Diagnosis present

## 2017-02-21 DIAGNOSIS — I110 Hypertensive heart disease with heart failure: Secondary | ICD-10-CM | POA: Diagnosis present

## 2017-02-21 DIAGNOSIS — Z882 Allergy status to sulfonamides status: Secondary | ICD-10-CM | POA: Diagnosis not present

## 2017-02-21 DIAGNOSIS — F202 Catatonic schizophrenia: Secondary | ICD-10-CM | POA: Diagnosis present

## 2017-02-21 DIAGNOSIS — G4733 Obstructive sleep apnea (adult) (pediatric): Secondary | ICD-10-CM | POA: Diagnosis present

## 2017-02-21 DIAGNOSIS — E785 Hyperlipidemia, unspecified: Secondary | ICD-10-CM | POA: Diagnosis present

## 2017-02-21 DIAGNOSIS — Z8679 Personal history of other diseases of the circulatory system: Secondary | ICD-10-CM

## 2017-02-21 DIAGNOSIS — M1990 Unspecified osteoarthritis, unspecified site: Secondary | ICD-10-CM | POA: Diagnosis present

## 2017-02-21 DIAGNOSIS — Z887 Allergy status to serum and vaccine status: Secondary | ICD-10-CM

## 2017-02-21 DIAGNOSIS — Z794 Long term (current) use of insulin: Secondary | ICD-10-CM | POA: Diagnosis not present

## 2017-02-21 DIAGNOSIS — Z91040 Latex allergy status: Secondary | ICD-10-CM

## 2017-02-21 DIAGNOSIS — Z6839 Body mass index (BMI) 39.0-39.9, adult: Secondary | ICD-10-CM

## 2017-02-21 DIAGNOSIS — I5033 Acute on chronic diastolic (congestive) heart failure: Secondary | ICD-10-CM | POA: Diagnosis present

## 2017-02-21 LAB — CBC WITH DIFFERENTIAL/PLATELET
BASOS ABS: 0 10*3/uL (ref 0–0.1)
BASOS PCT: 1 %
EOS ABS: 0.1 10*3/uL (ref 0–0.7)
Eosinophils Relative: 3 %
HEMATOCRIT: 29.1 % — AB (ref 35.0–47.0)
HEMOGLOBIN: 9.6 g/dL — AB (ref 12.0–16.0)
Lymphocytes Relative: 15 %
Lymphs Abs: 0.9 10*3/uL — ABNORMAL LOW (ref 1.0–3.6)
MCH: 27.1 pg (ref 26.0–34.0)
MCHC: 32.9 g/dL (ref 32.0–36.0)
MCV: 82.4 fL (ref 80.0–100.0)
MONOS PCT: 14 %
Monocytes Absolute: 0.8 10*3/uL (ref 0.2–0.9)
NEUTROS ABS: 3.9 10*3/uL (ref 1.4–6.5)
NEUTROS PCT: 67 %
Platelets: 320 10*3/uL (ref 150–440)
RBC: 3.53 MIL/uL — ABNORMAL LOW (ref 3.80–5.20)
RDW: 16.5 % — AB (ref 11.5–14.5)
WBC: 5.8 10*3/uL (ref 3.6–11.0)

## 2017-02-21 LAB — BLOOD GAS, VENOUS
Acid-Base Excess: 25.4 mmol/L — ABNORMAL HIGH (ref 0.0–2.0)
Acid-Base Excess: 25.6 mmol/L — ABNORMAL HIGH (ref 0.0–2.0)
Bicarbonate: 51.9 mmol/L — ABNORMAL HIGH (ref 20.0–28.0)
Bicarbonate: 53.7 mmol/L — ABNORMAL HIGH (ref 20.0–28.0)
O2 SAT: 75.6 %
PATIENT TEMPERATURE: 37
PCO2 VEN: 79 mmHg — AB (ref 44.0–60.0)
PH VEN: 7.44 — AB (ref 7.250–7.430)
PO2 VEN: 36 mmHg (ref 32.0–45.0)
Patient temperature: 37
pCO2, Ven: 65 mmHg — ABNORMAL HIGH (ref 44.0–60.0)
pH, Ven: 7.51 — ABNORMAL HIGH (ref 7.250–7.430)

## 2017-02-21 LAB — BASIC METABOLIC PANEL
ANION GAP: 9 (ref 5–15)
BUN: 22 mg/dL — ABNORMAL HIGH (ref 6–20)
CALCIUM: 9.4 mg/dL (ref 8.9–10.3)
CO2: 41 mmol/L — ABNORMAL HIGH (ref 22–32)
CREATININE: 1.35 mg/dL — AB (ref 0.44–1.00)
Chloride: 86 mmol/L — ABNORMAL LOW (ref 101–111)
GFR calc non Af Amer: 38 mL/min — ABNORMAL LOW (ref >60)
GFR, EST AFRICAN AMERICAN: 44 mL/min — AB (ref >60)
Glucose, Bld: 199 mg/dL — ABNORMAL HIGH (ref 65–99)
Potassium: 4 mmol/L (ref 3.5–5.1)
SODIUM: 136 mmol/L (ref 135–145)

## 2017-02-21 LAB — MRSA PCR SCREENING: MRSA by PCR: NEGATIVE

## 2017-02-21 LAB — BRAIN NATRIURETIC PEPTIDE: B NATRIURETIC PEPTIDE 5: 283 pg/mL — AB (ref 0.0–100.0)

## 2017-02-21 LAB — TROPONIN I

## 2017-02-21 MED ORDER — ENOXAPARIN SODIUM 40 MG/0.4ML ~~LOC~~ SOLN
40.0000 mg | SUBCUTANEOUS | Status: DC
  Administered 2017-02-21 – 2017-02-23 (×3): 40 mg via SUBCUTANEOUS
  Filled 2017-02-21 (×4): qty 0.4

## 2017-02-21 MED ORDER — LORAZEPAM 2 MG/ML IJ SOLN
1.0000 mg | Freq: Once | INTRAMUSCULAR | Status: AC
  Administered 2017-02-21: 1 mg via INTRAVENOUS
  Filled 2017-02-21: qty 1

## 2017-02-21 MED ORDER — OLANZAPINE 5 MG PO TBDP
10.0000 mg | ORAL_TABLET | Freq: Every day | ORAL | Status: DC
  Administered 2017-02-21 – 2017-02-23 (×3): 10 mg via ORAL
  Filled 2017-02-21 (×3): qty 2

## 2017-02-21 MED ORDER — LORATADINE 10 MG PO TABS
10.0000 mg | ORAL_TABLET | Freq: Every day | ORAL | Status: DC
  Administered 2017-02-21 – 2017-02-24 (×4): 10 mg via ORAL
  Filled 2017-02-21 (×4): qty 1

## 2017-02-21 MED ORDER — METHYLPREDNISOLONE SODIUM SUCC 125 MG IJ SOLR
125.0000 mg | Freq: Once | INTRAMUSCULAR | Status: DC
Start: 2017-02-21 — End: 2017-02-21
  Filled 2017-02-21: qty 2

## 2017-02-21 MED ORDER — LAMOTRIGINE 25 MG PO TABS
25.0000 mg | ORAL_TABLET | Freq: Every day | ORAL | Status: DC
  Administered 2017-02-21 – 2017-02-24 (×4): 25 mg via ORAL
  Filled 2017-02-21 (×4): qty 1

## 2017-02-21 MED ORDER — METOPROLOL TARTRATE 50 MG PO TABS
50.0000 mg | ORAL_TABLET | Freq: Two times a day (BID) | ORAL | Status: DC
  Administered 2017-02-21 – 2017-02-24 (×6): 50 mg via ORAL
  Filled 2017-02-21 (×7): qty 1

## 2017-02-21 MED ORDER — IPRATROPIUM-ALBUTEROL 0.5-2.5 (3) MG/3ML IN SOLN
3.0000 mL | Freq: Three times a day (TID) | RESPIRATORY_TRACT | Status: DC
  Administered 2017-02-21 – 2017-02-22 (×4): 3 mL via RESPIRATORY_TRACT
  Filled 2017-02-21 (×4): qty 3

## 2017-02-21 MED ORDER — TRAMADOL HCL 50 MG PO TABS: 50.0000 mg | ORAL_TABLET | Freq: Four times a day (QID) | ORAL | Status: DC | PRN

## 2017-02-21 MED ORDER — CITALOPRAM HYDROBROMIDE 20 MG PO TABS
10.0000 mg | ORAL_TABLET | Freq: Every day | ORAL | Status: DC
  Administered 2017-02-21 – 2017-02-24 (×4): 10 mg via ORAL
  Filled 2017-02-21 (×4): qty 1

## 2017-02-21 MED ORDER — ONDANSETRON 4 MG PO TBDP
4.0000 mg | ORAL_TABLET | Freq: Three times a day (TID) | ORAL | Status: DC | PRN
  Administered 2017-02-21: 4 mg via ORAL
  Filled 2017-02-21 (×3): qty 1

## 2017-02-21 MED ORDER — ACETAMINOPHEN 325 MG PO TABS: 650.0000 mg | ORAL_TABLET | Freq: Four times a day (QID) | ORAL | Status: DC | PRN

## 2017-02-21 MED ORDER — BENZONATATE 100 MG PO CAPS: 100.0000 mg | ORAL_CAPSULE | Freq: Three times a day (TID) | ORAL | Status: DC | PRN

## 2017-02-21 MED ORDER — PANTOPRAZOLE SODIUM 40 MG PO TBEC
40.0000 mg | DELAYED_RELEASE_TABLET | Freq: Two times a day (BID) | ORAL | Status: DC
  Administered 2017-02-21 – 2017-02-24 (×6): 40 mg via ORAL
  Filled 2017-02-21 (×6): qty 1

## 2017-02-21 MED ORDER — LORAZEPAM 0.5 MG PO TABS
0.5000 mg | ORAL_TABLET | Freq: Two times a day (BID) | ORAL | Status: DC
  Administered 2017-02-21 – 2017-02-22 (×2): 0.5 mg via ORAL
  Filled 2017-02-21 (×2): qty 1

## 2017-02-21 MED ORDER — ENSURE ENLIVE PO LIQD
237.0000 mL | Freq: Three times a day (TID) | ORAL | Status: DC
  Administered 2017-02-21 – 2017-02-24 (×5): 237 mL via ORAL

## 2017-02-21 MED ORDER — LEVETIRACETAM 250 MG PO TABS
250.0000 mg | ORAL_TABLET | Freq: Two times a day (BID) | ORAL | Status: DC
  Administered 2017-02-21 – 2017-02-24 (×6): 250 mg via ORAL
  Filled 2017-02-21 (×7): qty 1

## 2017-02-21 MED ORDER — POLYETHYLENE GLYCOL 3350 17 G PO PACK
17.0000 g | PACK | Freq: Every day | ORAL | Status: DC
  Administered 2017-02-22 – 2017-02-24 (×3): 17 g via ORAL
  Filled 2017-02-21 (×4): qty 1

## 2017-02-21 MED ORDER — FUROSEMIDE 10 MG/ML IJ SOLN
20.0000 mg | Freq: Three times a day (TID) | INTRAMUSCULAR | Status: DC
  Administered 2017-02-21 – 2017-02-22 (×3): 20 mg via INTRAVENOUS
  Filled 2017-02-21 (×3): qty 2

## 2017-02-21 MED ORDER — SENNA 8.6 MG PO TABS
1.0000 | ORAL_TABLET | Freq: Every day | ORAL | Status: DC
  Administered 2017-02-22 – 2017-02-24 (×3): 8.6 mg via ORAL
  Filled 2017-02-21 (×3): qty 1

## 2017-02-21 MED ORDER — IPRATROPIUM-ALBUTEROL 0.5-2.5 (3) MG/3ML IN SOLN: 3.0000 mL | Freq: Three times a day (TID) | RESPIRATORY_TRACT | Status: DC

## 2017-02-21 MED ORDER — IPRATROPIUM-ALBUTEROL 0.5-2.5 (3) MG/3ML IN SOLN
3.0000 mL | Freq: Once | RESPIRATORY_TRACT | Status: AC
  Administered 2017-02-21: 3 mL via RESPIRATORY_TRACT
  Filled 2017-02-21: qty 3

## 2017-02-21 MED ORDER — ACETAMINOPHEN 650 MG RE SUPP: 650.0000 mg | Freq: Four times a day (QID) | RECTAL | Status: DC | PRN

## 2017-02-21 MED ORDER — FLUTICASONE PROPIONATE 50 MCG/ACT NA SUSP: 2.0000 | NASAL | Status: DC | PRN

## 2017-02-21 MED ORDER — GUAIFENESIN ER 600 MG PO TB12
600.0000 mg | ORAL_TABLET | Freq: Two times a day (BID) | ORAL | Status: DC
  Administered 2017-02-21 – 2017-02-24 (×6): 600 mg via ORAL
  Filled 2017-02-21 (×6): qty 1

## 2017-02-21 MED ORDER — INSULIN GLARGINE 100 UNIT/ML ~~LOC~~ SOLN
43.0000 [IU] | Freq: Every day | SUBCUTANEOUS | Status: DC
  Administered 2017-02-21 – 2017-02-23 (×2): 43 [IU] via SUBCUTANEOUS
  Filled 2017-02-21 (×4): qty 0.43

## 2017-02-21 NOTE — ED Notes (Signed)
RT called with CO2 of 79 - this was reported to Dr Jimmye Norman - he requested RT be called to place pt on bi-pap - RT notified

## 2017-02-21 NOTE — ED Notes (Signed)
Attempted to call report and was told that the nurse taking report was at lunch - I was rang to charge who was with another pt and told me to call back - I called back to the floor and left ascom number for nurse to return the call

## 2017-02-21 NOTE — ED Notes (Signed)
Pt states she does not feel relaxed enough for bi-pap - will wait 10 more minutes before calling RT

## 2017-02-21 NOTE — ED Provider Notes (Signed)
[facility] Emergency Department Provider Note       Time seen: ----------------------------------------- 9:19 AM on 02/21/2017 -----------------------------------------     I have reviewed the triage vital signs and the nursing notes.   HISTORY   Chief Complaint No chief complaint on file.    HPI Carolyn Valdez is a 76 y.o. female who presents to the ED for shortness of breath. EMS arrived and found the patient hypoxic. Reportedly she was supposed to be wearing BiPAP at night and has not been wearing BiPAP. EMS placed her on 4 L nasal cannula with improvement in her oxygen levels. Currently she feels dizzy and lightheaded but does not complain of specific shortness of breath. She denies fevers, chills or other complaints.   Past Medical History:  Diagnosis Date  . Anxiety   . Breast cancer ([place])    16 yrs ago  . CHF (congestive heart failure) ([place])   . Diabetes mellitus without complication ([place])   . DJD (degenerative joint disease)   . Dysuria   . H/O total knee replacement    right  . HTN (hypertension)   . Hypercholesteremia   . Kidney stone   . Obesity   . Recurrent urinary tract infection   . SVT (supraventricular tachycardia) ([place])   . Urinary incontinence   . Vaginal atrophy   . Yeast vaginitis     Patient Active Problem List   Diagnosis Date Noted  . Adjustment disorder with anxiety 01/28/2017  . Acute on chronic respiratory failure with hypoxia ([place]) 01/23/2017  . Palliative care encounter   . Goals of care, counseling/discussion   . DNR (do not resuscitate) 10/05/2016  . Palliative care by specialist 10/05/2016  . Depression, major, recurrent, severe with psychosis ([place]) 10/05/2016  . Severe major depression, single episode, with psychotic features ([place]) 10/04/2016  . Cellulitis of leg, right 09/29/2016  . Proctitis 09/29/2016  . Hypercapnia 09/29/2016  . Acute urinary retention 09/29/2016  . UTI (urinary tract infection)  09/29/2016  . Weakness 09/29/2016  . Subacute delirium 09/28/2016  . Gastrointestinal hemorrhage   . Diffuse abdominal pain   . Altered mental status 09/24/2016  . Pressure injury of skin 09/02/2016  . Respiratory failure with hypoxia ([place]) 09/01/2016  . Lymphedema 08/16/2016  . Acute on chronic congestive heart failure ([place]) 08/09/2016  . Acquired lymphedema of leg 04/21/2016  . Congestive heart failure ([place]) 11/25/2015  . Nocturia 11/05/2015  . Urinary frequency 11/05/2015  . Acute respiratory failure with hypoxia ([place]) 09/05/2015  . 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 Incontinence 04/20/2015  . Diabetes mellitus, type 2 ([place]) 04/17/2015  . ESBL (extended spectrum beta-lactamase) producing bacteria infection 04/17/2015  . BP (high blood pressure) 04/17/2015  . Frequent UTI 04/17/2015  . Absence of bladder continence 04/17/2015  . Iron deficiency anemia 03/08/2015  . Absolute anemia 09/25/2014  . Abdominal pain, lower 09/25/2014  . Urge incontinence 02/19/2013  . FOM (frequency of micturition) 02/19/2013  . Bladder infection, chronic 08/11/2012  . Difficult or painful urination 07/26/2012  . Lower urinary tract infection 12/31/2011  . Diabetes mellitus ([place]) 12/31/2011    Past Surgical History:  Procedure Laterality Date  . CARDIAC CATHETERIZATION    . CHOLECYSTECTOMY    . FOOT SURGERY    . JOINT REPLACEMENT    . MASTECTOMY Right 1997  . NASAL SEPTUM SURGERY    . PERIPHERAL VASCULAR CATHETERIZATION N/A 07/08/2015   Procedure: PICC Line Insertion;  Surgeon: Algernon Huxley, MD;  Location: [facility];  Service: Cardiovascular;  Laterality: N/A;  . TONSILLECTOMY    . Vocal cords      Allergies Sulfa antibiotics; Biaxin [clarithromycin]; Influenza a (h1n1) monoval vac; Morphine; Pyridium [phenazopyridine hcl]; Ceftriaxone; Latex; Prednisone; and Tape  Social History Social History  Substance Use Topics  . Smoking status: Former Research scientist (life sciences)  . Smokeless tobacco: Former Systems developer     Quit date: 10/29/1995  . Alcohol use No    Review of Systems Constitutional: Negative for fever. Eyes: Negative for vision changes ENT:  Negative for congestion, sore throat Cardiovascular: Negative for chest pain. Respiratory: Positive for recent shortness of breath Gastrointestinal: Negative for abdominal pain, vomiting and diarrhea. Genitourinary: Negative for dysuria. Musculoskeletal: Negative for back pain. Skin: Negative for rash. Neurological: Negative for headaches, positive for dizziness and lightheadedness  All systems negative/normal/unremarkable except as stated in the HPI  ____________________________________________   PHYSICAL EXAM:  VITAL SIGNS: ED Triage Vitals  Enc Vitals Group     BP      Pulse      Resp      Temp      Temp src      SpO2      Weight      Height      Head Circumference      Peak Flow      Pain Score      Pain Loc      Pain Edu?      Excl. in [place]?     Constitutional: Alert, no obvious distress Eyes: Conjunctivae are normal. PERRL. Normal extraocular movements. ENT   Head: Normocephalic and atraumatic.   Nose: No congestion/rhinnorhea.   Mouth/Throat: Mucous membranes are moist.   Neck: No stridor. Cardiovascular: Normal rate, regular rhythm. No murmurs, rubs, or gallops. Respiratory: Normal respiratory effort without tachypnea nor retractions. Breath sounds are clear and equal bilaterally. No wheezes/rales/rhonchi. Gastrointestinal: Soft and nontender. Normal bowel sounds Musculoskeletal: Nontender with normal range of motion in extremities. Bilateral mild edema Neurologic:  Normal speech and language. No gross focal neurologic deficits are appreciated.  Skin:  Skin is warm, dry and intact. Pallor is noted Psychiatric: Mood and affect are normal. Speech and behavior are normal.  ____________________________________________  EKG: Interpreted by me. Sinus rhythm rate of 76 bpm, normal PR interval, normal QRS,  normal QT.  ____________________________________________  ED COURSE:  Pertinent labs & imaging results that were available during my care of the patient were reviewed by me and considered in my medical decision making (see chart for details). Patient presents for shortness of breath, we will assess with labs and imaging as indicated.   Procedures ____________________________________________   LABS (pertinent positives/negatives)  Labs Reviewed  CBC WITH DIFFERENTIAL/PLATELET - Abnormal; Notable for the following:       Result Value   RBC 3.53 (*)    Hemoglobin 9.6 (*)    HCT 29.1 (*)    RDW 16.5 (*)    Lymphs Abs 0.9 (*)    All other components within normal limits  BASIC METABOLIC PANEL - Abnormal; Notable for the following:    Chloride 86 (*)    CO2 41 (*)    Glucose, Bld 199 (*)    BUN 22 (*)    Creatinine, Ser 1.35 (*)    GFR calc non Af Amer 38 (*)    GFR calc Af Amer 44 (*)    All other components within normal limits  BRAIN NATRIURETIC PEPTIDE - Abnormal; Notable for the following:  B Natriuretic Peptide 283.0 (*)    All other components within normal limits  BLOOD GAS, VENOUS - Abnormal; Notable for the following:    pH, Ven 7.51 (*)    pCO2, Ven 65 (*)    Bicarbonate 51.9 (*)    Acid-Base Excess 25.4 (*)    All other components within normal limits  BLOOD GAS, VENOUS - Abnormal; Notable for the following:    pH, Ven 7.44 (*)    pCO2, Ven 79 (*)    Bicarbonate 53.7 (*)    Acid-Base Excess 25.6 (*)    All other components within normal limits  TROPONIN I   CRITICAL CARE Performed by: Earleen Newport   Total critical care time: 30 minutes  Critical care time was exclusive of separately billable procedures and treating other patients.  Critical care was necessary to treat or prevent imminent or life-threatening deterioration.  Critical care was time spent personally by me on the following activities: development of treatment plan with patient  and/or surrogate as well as nursing, discussions with consultants, evaluation of patient's response to treatment, examination of patient, obtaining history from patient or surrogate, ordering and performing treatments and interventions, ordering and review of laboratory studies, ordering and review of radiographic studies, pulse oximetry and re-evaluation of patient's condition.  RADIOLOGY Images were viewed by me  Chest x-ray IMPRESSION: Improving CHF. No discrete pneumonia.  Thoracic aortic atherosclerosis. ____________________________________________  FINAL ASSESSMENT AND PLAN  Dyspnea, Acute on chronic respiratory failure  Plan: Patient's labs and imaging were dictated above. Patient had presented for shortness of breath and appears to have acute on chronic respiratory failure. We gave her breathing treatment and repeated a blood gas which showed worsening in her PCO2. We have placed her on BiPAP if she can tolerate. I will discuss with the hospitalist for admission at this time.   Earleen Newport, MD   Note: This note was generated in part or whole with voice recognition software. Voice recognition is usually quite accurate but there are transcription errors that can and very often do occur. I apologize for any typographical errors that were not detected and corrected.     Earleen Newport, MD 02/21/17 [phone]

## 2017-02-21 NOTE — Care Management (Signed)
Patient is from [facility] followed by [facility]. I have notified Tim with [facility] of patient admit.

## 2017-02-21 NOTE — H&P (Signed)
[facility] NAME: Carolyn Valdez    MR#:  [id number]  DATE OF BIRTH:  [birth year]  DATE OF ADMISSION:  02/21/2017  PRIMARY CARE PHYSICIAN: Leonel Ramsay, MD   REQUESTING/REFERRING PHYSICIAN: Dr. Jimmye Norman  CHIEF COMPLAINT:  Patient currently is on BiPAP. She is not the best historian. She is here for increasing shortness of breath since yesterday. No family in the emergency room. There was a sister who was earlier was stepped out  HISTORY OF PRESENT ILLNESS:  Carolyn Valdez  is a 76 y.o. female with a known history of Chronic diastolic heart failure, history of breast cancer, obstructive sleep apnea, chronic lower extremity edema for was admitted recently from April 8 to April 20 due to catatonic depression shortness of breath and hypoxia. Patient went to assisted living/home placed and comes back today with increasing shortness of breath. She was found to have a bicarbonate of 79. She appears to be a chronic retainer of CO2. She uses 3 L of nasal cannula oxygen chronically. I'm not sure if patient is set up to use CPAP at nighttime or not. In the ER all patient got was 1 nebulizer treatment and was placed on BiPAP. She is able to complete sentences. She wants to come off the BiPAP. She is being admitted with acute on chronic hypoxic and hypercarbic respiratory failure secondary to COPD/CHF acute on chronic diastolic mild  PAST MEDICAL HISTORY:   Past Medical History:  Diagnosis Date  . Anxiety   . Breast cancer ([place])    16 yrs ago  . CHF (congestive heart failure) ([place])   . Diabetes mellitus without complication ([place])   . DJD (degenerative joint disease)   . Dysuria   . H/O total knee replacement    right  . HTN (hypertension)   . Hypercholesteremia   . Kidney stone   . Obesity   . Recurrent urinary tract infection   . SVT (supraventricular tachycardia) ([place])   . Urinary incontinence   . Vaginal atrophy   . Yeast vaginitis      PAST SURGICAL HISTOIRY:   Past Surgical History:  Procedure Laterality Date  . CARDIAC CATHETERIZATION    . CHOLECYSTECTOMY    . FOOT SURGERY    . JOINT REPLACEMENT    . MASTECTOMY Right 1997  . NASAL SEPTUM SURGERY    . PERIPHERAL VASCULAR CATHETERIZATION N/A 07/08/2015   Procedure: PICC Line Insertion;  Surgeon: Algernon Huxley, MD;  Location: [facility];  Service: Cardiovascular;  Laterality: N/A;  . TONSILLECTOMY    . Vocal cords      SOCIAL HISTORY:   Social History  Substance Use Topics  . Smoking status: Former Research scientist (life sciences)  . Smokeless tobacco: Former Systems developer    Quit date: 10/29/1995  . Alcohol use No    FAMILY HISTORY:   Family History  Problem Relation Age of Onset  . Lung cancer Father   . Hematuria Mother   . Lung cancer Mother   . Kidney disease Neg Hx   . Bladder Cancer Neg Hx   . Breast cancer Neg Hx     DRUG ALLERGIES:   Allergies  Allergen Reactions  . Sulfa Antibiotics Hives  . Biaxin [Clarithromycin] Hives  . Influenza A (H1n1) Monoval Vac Other (See Comments)    Pt states that she was told by her MD not to get the influenza vaccine.    . Morphine Other (See Comments)  Reaction:  Dizziness and confusion   . Pyridium [Phenazopyridine Hcl] Other (See Comments)    Reaction:  Unknown   . Ceftriaxone Anxiety  . Latex Rash  . Prednisone Rash  . Tape Rash    REVIEW OF SYSTEMS:  Review of Systems  Unable to perform ROS: Severe respiratory distress     MEDICATIONS AT HOME:   Prior to Admission medications   Medication Sig Start Date End Date Taking? Authorizing Provider  acetaminophen (TYLENOL) 650 MG CR tablet Take 650 mg by mouth every 6 (six) hours as needed for pain.   Yes [provider]  atorvastatin (LIPITOR) 20 MG tablet Take 20 mg by mouth daily.    Yes [provider]  benzonatate (TESSALON) 100 MG capsule Take 100 mg by mouth 3 (three) times daily as needed for cough.   Yes [provider]   citalopram (CELEXA) 10 MG tablet Take 1 tablet (10 mg total) by mouth daily. 02/04/17  Yes Wieting, Richard, MD  docusate sodium (COLACE) 100 MG capsule Take 100 mg by mouth 2 (two) times daily.    Yes [provider]  fluticasone (FLONASE) 50 MCG/ACT nasal spray Place 2 sprays into the nose as needed. 03/04/16  Yes [provider]  furosemide (LASIX) 40 MG tablet Take 1 tablet (40 mg total) by mouth 2 (two) times daily. Patient taking differently: Take 40 mg by mouth daily.  02/04/17  Yes Wieting, Richard, MD  guaiFENesin (MUCINEX) 600 MG 12 hr tablet Take 600 mg by mouth 2 (two) times daily.   Yes [provider]  hydrOXYzine (ATARAX/VISTARIL) 25 MG tablet Take 1 tablet by mouth at bedtime.  07/05/16  Yes [provider]  insulin glargine (LANTUS) 100 UNIT/ML injection Inject 0.43 mLs (43 Units total) into the skin at bedtime. 02/04/17  Yes Wieting, Richard, MD  ipratropium-albuterol (DUONEB) 0.5-2.5 (3) MG/3ML SOLN Take 3 mLs by nebulization 3 (three) times daily. 02/04/17  Yes Wieting, Richard, MD  iron polysaccharides (NIFEREX) 150 MG capsule Take 150 mg by mouth 3 (three) times daily.    Yes [provider]  lamoTRIgine (LAMICTAL) 25 MG tablet Take 25 mg by mouth daily.   Yes [provider]  levETIRAcetam (KEPPRA) 250 MG tablet Take 1 tablet (250 mg total) by mouth 2 (two) times daily. 10/26/16  Yes Epifanio Lesches, MD  loratadine (CLARITIN) 10 MG tablet Take 10 mg by mouth daily.   Yes [provider]  LORazepam (ATIVAN) 0.5 MG tablet Take 1 tablet (0.5 mg total) by mouth 2 (two) times daily. 02/04/17  Yes Wieting, Richard, MD  metoprolol (LOPRESSOR) 50 MG tablet Take 50 mg by mouth 2 (two) times daily.   Yes [provider]  nitrofurantoin, macrocrystal-monohydrate, (MACROBID) 100 MG capsule Take 1 capsule (100 mg total) by mouth daily. 02/04/17  Yes Wieting, Richard, MD  OLANZapine zydis (ZYPREXA) 10 MG disintegrating  tablet Take 1 tablet (10 mg total) by mouth at bedtime. 02/04/17  Yes Wieting, Richard, MD  ondansetron (ZOFRAN ODT) 4 MG disintegrating tablet Take 1 tablet (4 mg total) by mouth every 8 (eight) hours as needed for nausea or vomiting. 09/11/16  Yes Harvest Dark, MD  pantoprazole (PROTONIX) 40 MG tablet Take 40 mg by mouth 2 (two) times daily. 02/09/17  Yes [provider]  polyethylene glycol (MIRALAX / GLYCOLAX) packet Take 17 g by mouth daily.   Yes [provider]  senna (SENOKOT) 8.6 MG TABS tablet Take 1 tablet (8.6 mg total) by mouth  daily. 09/30/16  Yes Theodoro Grist, MD  traMADol (ULTRAM) 50 MG tablet Take 1 tablet (50 mg total) by mouth every 6 (six) hours as needed. 02/04/17  Yes Wieting, Richard, MD  vitamin E 1000 UNIT capsule Take 1,000 Units by mouth daily.   Yes [provider]  feeding supplement, ENSURE ENLIVE, (ENSURE ENLIVE) LIQD Take 237 mLs by mouth 3 (three) times daily between meals. 10/26/16   Epifanio Lesches, MD  insulin aspart (NOVOLOG) 100 UNIT/ML injection Inject 0-20 Units into the skin 3 (three) times daily with meals. Patient not taking: Reported on 02/21/2017 09/05/16   Epifanio Lesches, MD  insulin aspart (NOVOLOG) 100 UNIT/ML injection Inject 0-5 Units into the skin at bedtime. Patient not taking: Reported on 02/21/2017 09/05/16   Epifanio Lesches, MD  nystatin (MYCOSTATIN/NYSTOP) powder Apply topically 3 (three) times daily. Patient not taking: Reported on 02/21/2017 09/05/16   Epifanio Lesches, MD      VITAL SIGNS:  Blood pressure (!) 155/70, pulse 76, temperature 98.8 F (37.1 C), temperature source Oral, resp. rate (!) 24, height 5\' 7"  (1.702 m), weight 127 kg (280 lb), SpO2 94 %.  PHYSICAL EXAMINATION:  GENERAL:  76 y.o.-year-old patient lying in the bed with no acute distress. Morbidly obese EYES: Pupils equal, round, reactive to light and accommodation. No scleral icterus. Extraocular muscles intact.  HEENT:  Head atraumatic, normocephalic. Oropharynx and nasopharynx clear. BiPAP NECK:  Supple, no jugular venous distention. No thyroid enlargement, no tenderness.  LUNGS: Distant breath sounds bilaterally, no wheezing, rales,rhonchi or crepitation. No use of accessory muscles of respiration.  CARDIOVASCULAR: S1, S2 normal. No murmurs, rubs, or gallops.  ABDOMEN: Soft, nontender, nondistended. Bowel sounds present. No organomegaly or mass. Morbid obesity EXTREMITIES: Chronic pedal edema, no cyanosis, or clubbing.  NEUROLOGIC: Moves all extremities well. Unable to test in detail secondary to patient being on BiPAP PSYCHIATRIC: The patient is alert on BiPAP SKIN: No obvious rash, lesion, or ulcer.   LABORATORY PANEL:   CBC  Recent Labs Lab 02/21/17 0917  WBC 5.8  HGB 9.6*  HCT 29.1*  PLT 320   ------------------------------------------------------------------------------------------------------------------  Chemistries   Recent Labs Lab 02/21/17 0917  NA 136  K 4.0  CL 86*  CO2 41*  GLUCOSE 199*  BUN 22*  CREATININE 1.35*  CALCIUM 9.4   ------------------------------------------------------------------------------------------------------------------  Cardiac Enzymes  Recent Labs Lab 02/21/17 0917  TROPONINI <0.03   ------------------------------------------------------------------------------------------------------------------  RADIOLOGY:  Dg Chest Port 1 View  Result Date: 02/21/2017 CLINICAL DATA:  Shortness of breath, patient is on home oxygen and eggs is exhibiting hypoxia. History of COPD and diabetes and CHF. Former smoker. EXAM: PORTABLE CHEST 1 VIEW COMPARISON:  Portable chest x-ray of January 28, 2017 FINDINGS: The lungs are reasonably well inflated. The interstitial markings have improved. The cardiac silhouette is decreased in size. Small amount of pleural fluid on the left persists. There is calcification in the wall of the aortic arch. The bony structures are  unremarkable. IMPRESSION: Improving CHF.  No discrete pneumonia. Thoracic aortic atherosclerosis. Electronically Signed   By: David  Martinique M.D.   On: 02/21/2017 09:46    EKG:   Sinus rhythm IMPRESSION AND PLAN:  76 year old female with past medical history of anxiety, depression, hypertension, diabetes, obstructive sleep apnea, chronic respiratory failure, chronic diastolic CHF, history of recurrent UTIs, who presents to the hospital due to shortness of breath and hypoxia.  1. Acute on chronic respiratory failure withHypercarbia hypoxia-secondary to volume overload and mild CHF and COPD -Continue with supplementation, we'll  diuresis with IV Lasix, follow clinically. Wean off BiPAP  as tolerated. -IV Lasix 20 mg 3 times a day -Patient currently is not wheezing, holding off Solu-Medrol -Pulmonary consultation -Nebulizer treatment  2. CHF-acute on chronic diastolic dysfunction. -We'll diuresis with IV Lasix, follow I's and O's and daily weights. -Continue metoprolol.  3. Diabetes type 2 without complication-continue Lantus, sliding scale insulin, Actos. Follow blood sugars.  4. History of seizures-continue Keppra, Lamictal.  5. History of depression with catatonia-continue Zyprexa.  6. GERD-continue Protonix.  7. Hyperlipidemia-continue atorvastatin.    Case was discussed with ICU attending Dr. Mortimer Fries. No family present in the emergency room  All the records are reviewed and case discussed with ED provider. Management plans discussed with the patient and they are in agreement.  CODE STATUS: **DO NOT RESUSCITATE patient has out of facility in the form  TOTAL CRITICAL TIME TAKING CARE OF THIS PATIENT: *55* minutes.    Srihan Brutus M.D on 02/21/2017 at 2:19 PM  Between 7am to 6pm - Pager - [phone]  After 6pm go to [email or web address] - password [email or web address] [facility]  [facility]  Office  [phone]  CC: Primary care physician; Leonel Ramsay, MD

## 2017-02-21 NOTE — ED Triage Notes (Signed)
E[facility] for SOB. Per ems pt was 50% o2 sats on her usual 3LNC.

## 2017-02-21 NOTE — ED Notes (Signed)
Pt placed on bipap  

## 2017-02-21 NOTE — Consult Note (Signed)
Carolyn Valdez,   Carolyn Valdez 29-Mar-1941 Code Status:  Code Status History    Date Active Date Inactive Code Status Order ID Comments User Context   01/31/2017  4:17 PM 02/04/2017  1:50 PM DNR [id number]  Max Sane, MD Inpatient   01/23/2017  1:20 PM 01/31/2017  4:17 PM Full Code [id number]  Henreitta Leber, MD Inpatient   10/05/2016 12:16 PM 10/26/2016  5:40 PM DNR [id number]  Knox Royalty, NP Inpatient   09/24/2016  5:31 PM 10/05/2016 12:16 PM Full Code [id number]  Gladstone Lighter, MD Inpatient   09/01/2016  2:17 AM 09/05/2016  4:28 PM Full Code [id number]  Hugelmeyer, Hope[place], DO Inpatient   08/09/2016  8:27 PM 08/12/2016  8:15 PM Full Code [id number]  Henreitta Leber, MD Inpatient   09/30/2015  2:23 PM 10/07/2015  5:37 PM Full Code [id number]  Loletha Grayer, MD ED   09/05/2015  5:56 PM 09/09/2015  5:38 PM DNR [id number]  Loletha Grayer, MD ED    Questions for Most Recent Historical Code Status (Order [id number])    Question Answer Comment   In the event of cardiac or respiratory ARREST Do not call a "code blue"    In the event of cardiac or respiratory ARREST Do not perform Intubation, CPR, defibrillation or ACLS    In the event of cardiac or respiratory ARREST Use medication by any route, position, wound care, and other measures to relive pain and suffering. May use oxygen, suction and manual treatment of airway obstruction as needed for comfort.         Advance Directive Documentation     Most Recent Value  Type of Advance Directive  Out of facility DNR (pink MOST or yellow form)  Pre-existing out of facility DNR order (yellow form or pink MOST form)  Yellow form placed in chart (order not valid for inpatient use)  "MOST" Form in Place?  -     Hosp day:@LENGTHOFSTAYDAYS @ Referring MD: @ATDPROV @     PCP:      AdmissionWeight: 280 lb (127 kg)                 CurrentWeight: 261 lb 3.9 oz (118.5 kg) Carolyn Valdez  is a 76 y.o. old female seen in [place] for resp distress at the request of Dr. Posey Pronto.     CHIEF COMPLAINT:   Acute resp distress   HISTORY OF PRESENT ILLNESS  75 y.o. female with a known history of Chronic diastolic heart failure, history of breast cancer, obstructive sleep apnea, chronic lower extremity edema for was admitted recently from April 8 to April 20 due to catatonic depression shortness of breath and hypoxia. - Patient went to assisted living/home placed and comes back today with increasing shortness of breath.  -She was found to have a pco2 79.  She uses 3 L of nasal cannula oxygen chronically.  She denies  set up to use CPAP at nighttime  In the ER all patient got was 1 nebulizer treatment and was placed on BiPAP.  -She is able to complete sentences.  -She wants to come off the BiPAP. She is being admitted with acute on chronic hypoxic and hypercarbic respiratory failure secondary to COPD/CHF acute on chronic diastolic mild    PAST MEDICAL HISTORY   Past Medical History:  Diagnosis Date  . Anxiety   . Breast cancer ([place])    16 yrs ago  . CHF (  congestive heart failure) ([place])   . Diabetes mellitus without complication ([place])   . DJD (degenerative joint disease)   . Dysuria   . H/O total knee replacement    right  . HTN (hypertension)   . Hypercholesteremia   . Kidney stone   . Obesity   . Recurrent urinary tract infection   . SVT (supraventricular tachycardia) ([place])   . Urinary incontinence   . Vaginal atrophy   . Yeast vaginitis      SURGICAL HISTORY   Past Surgical History:  Procedure Laterality Date  . CARDIAC CATHETERIZATION    . CHOLECYSTECTOMY    . FOOT SURGERY    . JOINT REPLACEMENT    . MASTECTOMY Right 1997  . NASAL SEPTUM SURGERY    . PERIPHERAL VASCULAR CATHETERIZATION N/A 07/08/2015   Procedure: PICC Line Insertion;  Surgeon: Algernon Huxley, MD;  Location: [facility];  Service: Cardiovascular;  Laterality: N/A;  .  TONSILLECTOMY    . Vocal cords       FAMILY HISTORY   Family History  Problem Relation Age of Onset  . Lung cancer Father   . Hematuria Mother   . Lung cancer Mother   . Kidney disease Neg Hx   . Bladder Cancer Neg Hx   . Breast cancer Neg Hx      SOCIAL HISTORY   Social History  Substance Use Topics  . Smoking status: Former Research scientist (life sciences)  . Smokeless tobacco: Former Systems developer    Quit date: 10/29/1995  . Alcohol use No     MEDICATIONS    Home Medication:    Current Medication:  Current Facility-Administered Medications:  .  furosemide (LASIX) injection 20 mg, 20 mg, Intravenous, Q8H, Fritzi Mandes, MD  Facility-Administered Medications Ordered in Other Encounters:  .  0.9 %  sodium chloride infusion, , , Continuous PRN, Dionne Bucy, CRNA    ALLERGIES   Sulfa antibiotics; Biaxin [clarithromycin]; Influenza a (h1n1) monoval vac; Morphine; Pyridium [phenazopyridine hcl]; Ceftriaxone; Latex; Prednisone; and Tape     REVIEW OF SYSTEMS    Review of Systems:  Gen:  Fatigue, SOB HEENT: Denies blurred vision, double vision, ear pain, eye pain, hearing loss, nose bleeds, sore throat Cardiac:  No dizziness, chest pain or heaviness, chest tightness,edema Resp:  +shortness of breath,-wheezing, -hemoptysis,  Gi: Denies swallowing difficulty, stomach pain, nausea or vomiting, diarrhea, constipation, bowel incontinence Gu:  Denies bladder incontinence, burning urine Ext:   Denies Joint pain, stiffness or swelling Skin: Denies  skin rash, easy bruising or bleeding or hives Endoc:  Denies polyuria, polydipsia , polyphagia or weight change Psych:   Denies depression, insomnia or hallucinations   Other:  All other systems negative   VS: BP (!) 123/56 (BP Location: Left Arm)   Pulse 62   Temp (!) 96.8 F (36 C) (Axillary)   Resp 18   Ht 5\' 8"  (1.727 m)   Wt 261 lb 3.9 oz (118.5 kg)   SpO2 98%   BMI 39.72 kg/m      PHYSICAL EXAM  Physical Examination:    GENERAL:NAD, no fevers, chills, no weakness no fatigue HEAD: Normocephalic, atraumatic.  EYES: Pupils equal, round, reactive to light. Extraocular muscles intact. No scleral icterus.  MOUTH: Moist mucosal membrane. Dentition intact. No abscess noted.  EAR, NOSE, THROAT: Clear without exudates. No external lesions.  NECK: Supple. No thyromegaly. No nodules. No JVD.  PULMONARY: +rhonchi CARDIOVASCULAR: S1 and S2. Regular rate and rhythm. No murmurs, rubs, or gallops. No edema.  Pedal pulses 2+ bilaterally.  GASTROINTESTINAL: Soft, nontender, nondistended. No masses. Positive bowel sounds. No hepatosplenomegaly.  MUSCULOSKELETAL: No swelling, clubbing, or edema. Range of motion full in all extremities.  NEUROLOGIC: Cranial nerves II through XII are intact. No gross focal neurological deficits. Sensation intact. Reflexes intact.  SKIN: No ulceration, lesions, rashes, or cyanosis. Skin warm and dry. Turgor intact.  PSYCHIATRIC: Mood, affect within normal limits. The patient is awake, alert and oriented x 3. Insight, judgment intact.  ALL OTHER ROS ARE NEGATIVE     IMAGING    Dg Chest 1 View  Result Date: 01/23/2017 CLINICAL DATA:  SOB x 2 days Hx - CHF, HTN, SVT, diabetes, breast cancer - right side 1997, former smoker. EXAM: CHEST 1 VIEW COMPARISON:  Chest x-rays dated 10/21/2016, 09/11/2016 and 12/24/2015 FINDINGS: Stable cardiomegaly. Atherosclerotic changes again noted at the aortic arch. Central pulmonary vascular congestion and bilateral interstitial edema, likely superimposed on chronic bronchitic changes and chronic interstitial lung disease. Probable small left pleural effusion and mild bibasilar atelectasis. IMPRESSION: 1. Cardiomegaly with central pulmonary vascular congestion and bilateral interstitial edema indicating mild CHF/volume overload. Based on earlier exams, this appears to be superimposed on chronic bronchitic changes and some degree of chronic interstitial lung disease. 2.  No overt alveolar pulmonary edema.  No evidence of pneumonia. 3. Probable small left pleural effusion and mild bibasilar atelectasis. Electronically Signed   By: Franki Cabot M.D.   On: 01/23/2017 09:22   Ct Angio Chest Pe W Or Wo Contrast  Result Date: 01/24/2017 CLINICAL DATA:  Shortness of Breath EXAM: CT ANGIOGRAPHY CHEST WITH CONTRAST TECHNIQUE: Multidetector CT imaging of the chest was performed using the standard protocol during bolus administration of intravenous contrast. Multiplanar CT image reconstructions and MIPs were obtained to evaluate the vascular anatomy. CONTRAST:  75 cc Isovue COMPARISON:  None. FINDINGS: Cardiovascular: Cardiomegaly is noted. No pericardial effusion. Mitral valve calcifications are noted. Atherosclerotic calcifications of coronary arteries. The the no pulmonary embolus is noted. Atherosclerotic calcifications of thoracic aorta. No aortic aneurysm or aortic dissection. Mediastinum/Nodes: Sub- carinal lymph node measures 1.6 by 3 cm. AP window lymph node Measures 1.3 cm short-axis. Right hilar lymph node measures 1.6 cm short-axis. Left hilar lymph node Measures 1 cm short-axis. There is debris noted within esophagus. Mild thickening of distal esophageal wall. Findings highly suspicious for gastroesophageal reflux. The the right pretracheal lymph node measures 1.4 cm short-axis. There is a low-density nodule right lobe of thyroid gland measures 1.1 cm. Lungs/Pleura: There is bilateral small pleural effusion. Bilateral lower lobe posterior atelectasis or infiltrate right greater than left. Mild interstitial prominence bilateral upper lobes. There is patchy ground-glass attenuation bilateral upper lobes. Findings highly suspicious for mild pulmonary edema. Less likely upper lobe pneumonia. Upper Abdomen: Visualized upper abdomen shows no adrenal gland mass. There is mild splenomegaly. The spleen measures 14.2 cm in length. Postcholecystectomy surgical clips are noted. Visualized  pancreas and liver is unremarkable. Musculoskeletal: No destructive bony lesions are noted. Sagittal images of the spine shows mild degenerative changes mid and lower thoracic spine. Review of the MIP images confirms the above findings. IMPRESSION: 1. No pulmonary embolus is noted. 2. There is mediastinal and hilar adenopathy. Although might be reactive lymphoproliferative disease or metastatic disease cannot be entirely excluded. Follow-up examination in 3 months is recommended to assure stability or resolution. 3. There is debris noted throughout the esophagus. Mild thickening of distal esophageal wall. Gastroesophageal reflux or reflux esophagitis cannot be excluded. Less likely achalasia. 4. There is  bilateral small pleural effusion. Bilateral lower lobe atelectasis or infiltrate right greater than left. Mild interstitial prominence and patchy ground-glass attenuation bilaterally suspicious for mild pulmonary edema. Less likely diffuse bilateral pneumonia. 5. Mild degenerative changes thoracic spine. 6. Mild splenomegaly.  The spleen measures 14.2 cm in length. Electronically Signed   By: Lahoma Crocker M.D.   On: 01/24/2017 13:20   Dg Chest Port 1 View  Result Date: [place] CLINICAL DATA:  Shortness of breath, patient is on home oxygen and eggs is exhibiting hypoxia. History of COPD and diabetes and CHF. Former smoker. EXAM: PORTABLE CHEST 1 VIEW COMPARISON:  Portable chest x-ray of January 28, 2017 FINDINGS: The lungs are reasonably well inflated. The interstitial markings have improved. The cardiac silhouette is decreased in size. Small amount of pleural fluid on the left persists. There is calcification in the wall of the aortic arch. The bony structures are unremarkable. IMPRESSION: Improving CHF.  No discrete pneumonia. Thoracic aortic atherosclerosis. Electronically Signed   By: David  Martinique M.D.   On: 02/21/2017 09:46   Dg Chest Port 1 View  Result Date: 01/28/2017 CLINICAL DATA:  Shortness of  breath. EXAM: PORTABLE CHEST 1 VIEW COMPARISON:  CT chest 01/24/2017 FINDINGS: Diffuse bilateral interstitial thickening. Bilateral small pleural effusions. No pneumothorax. No focal consolidation. Stable cardiomegaly. The osseous structures are unremarkable. IMPRESSION: Findings consistent with CHF. Electronically Signed   By: Kathreen Devoid   On: 01/28/2017 13:49   I have Independently reviewed images of CXR   on [place] Interpretation:b/l opacities    ASSESSMENT/PLAN   76 yo white female admitted for acute resp distress from acute diastolic heart failure  1.wean off biPAP 2.oxygen as needed 3.lasix as tolerated 4.BD therapy 5.restart home meds  Ok to transfer to gen med floor if able to come off of biPAP  Patient  satisfied with Plan of action and management. All questions answered  Corrin Parker, M.D.  [facility]  Medical Director [place] Director [facility] Cardio-Pulmonary Department

## 2017-02-21 NOTE — ED Notes (Signed)
Pt refused bi-pap - Dr Jimmye Norman ordered Ativan and RT to be called 10 minutes after it is given to see if pt can handle bi-pap

## 2017-02-22 LAB — GLUCOSE, CAPILLARY
GLUCOSE-CAPILLARY: 128 mg/dL — AB (ref 65–99)
GLUCOSE-CAPILLARY: 300 mg/dL — AB (ref 65–99)
GLUCOSE-CAPILLARY: 193 mg/dL — AB (ref 65–99)
Glucose-Capillary: 136 mg/dL — ABNORMAL HIGH (ref 65–99)
Glucose-Capillary: 190 mg/dL — ABNORMAL HIGH (ref 65–99)

## 2017-02-22 MED ORDER — FUROSEMIDE 10 MG/ML IJ SOLN
60.0000 mg | Freq: Three times a day (TID) | INTRAMUSCULAR | Status: DC
  Administered 2017-02-22 – 2017-02-23 (×3): 60 mg via INTRAVENOUS
  Filled 2017-02-22 (×3): qty 6

## 2017-02-22 MED ORDER — INSULIN ASPART 100 UNIT/ML ~~LOC~~ SOLN
0.0000 [IU] | Freq: Three times a day (TID) | SUBCUTANEOUS | Status: DC
  Administered 2017-02-22: 3 [IU] via SUBCUTANEOUS
  Administered 2017-02-22: 11 [IU] via SUBCUTANEOUS
  Administered 2017-02-23: 3 [IU] via SUBCUTANEOUS
  Administered 2017-02-23: 8 [IU] via SUBCUTANEOUS
  Administered 2017-02-23: 11 [IU] via SUBCUTANEOUS
  Administered 2017-02-24: 8 [IU] via SUBCUTANEOUS
  Administered 2017-02-24: 5 [IU] via SUBCUTANEOUS
  Filled 2017-02-22: qty 8
  Filled 2017-02-22 (×2): qty 11
  Filled 2017-02-22: qty 5
  Filled 2017-02-22: qty 3
  Filled 2017-02-22: qty 11
  Filled 2017-02-22: qty 3

## 2017-02-22 MED ORDER — IPRATROPIUM-ALBUTEROL 0.5-2.5 (3) MG/3ML IN SOLN: 3.0000 mL | Freq: Four times a day (QID) | RESPIRATORY_TRACT | Status: DC | PRN

## 2017-02-22 MED ORDER — INSULIN ASPART 100 UNIT/ML ~~LOC~~ SOLN
0.0000 [IU] | Freq: Every day | SUBCUTANEOUS | Status: DC
  Administered 2017-02-23: 3 [IU] via SUBCUTANEOUS
  Filled 2017-02-22: qty 3

## 2017-02-22 NOTE — Clinical Social Work Note (Signed)
CSW received consult for "discharge planning" Per chart review, pt is from [place]. Full assessment to follow. PT eval is pending. CSW will continue to follow.   Darden Dates, MSW, LCSW  Clinical Social Worker  [phone]

## 2017-02-22 NOTE — Progress Notes (Signed)
PT Cancellation Note  Patient Details Name: Carolyn Valdez MRN: [id number] DOB: 1941/03/16   Cancelled Treatment:    Reason Eval/Treat Not Completed: Fatigue/lethargy limiting ability to participate (Consult received and chart reviewed.  Patient sleeping soundly upon arrival to room.  Opens eyes briefly with from therapist, but quickly drifts back to sleep. Unable to maintain arousal for full participation with session.  Will re-attempt next date as patient medically appropriate and able to participate.)   Gabreal Worton H. Owens Shark, PT, DPT, NCS 02/22/17, 10:56 AM [phone]

## 2017-02-22 NOTE — Progress Notes (Signed)
Gave telephone report to [employer]. Will transfer patient to room 160.

## 2017-02-22 NOTE — Progress Notes (Signed)
Patient alert and oriented. Lung sounds diminished. Patient denied SOB. Patient now on 1LO2, RN will continue to wean patient down. Normal sinus on Tele. Patient complaining back pain and unable to get comfortable in bed. RN recommended patient to turn herself in bed and to get up to recliner to change positions and also to benefit her breathing. Patient declined recommendation and requesting a new mattress.   Deri Fuelling, RN

## 2017-02-22 NOTE — Progress Notes (Signed)
Patient up to recliner for 1.5 hours today. Patient requested new bed, bed delivered. Patient now in bed.   Deri Fuelling, RN

## 2017-02-22 NOTE — Progress Notes (Signed)
[facility] [place] NAME: Desare Duddy    MR#:  [id number]  DATE OF BIRTH:  19-Dec-1940  SUBJECTIVE:  CHIEF COMPLAINT:   Chief Complaint  Patient presents with  . Shortness of Breath   On 2 L O2. Does not wear O2 normally  REVIEW OF SYSTEMS:    Review of Systems  Constitutional: Positive for malaise/fatigue. Negative for chills and fever.  HENT: Negative for sore throat.   Eyes: Negative for blurred vision, double vision and pain.  Respiratory: Positive for cough and shortness of breath. Negative for hemoptysis and wheezing.   Cardiovascular: Positive for orthopnea. Negative for chest pain, palpitations and leg swelling.  Gastrointestinal: Negative for abdominal pain, constipation, diarrhea, heartburn, nausea and vomiting.  Genitourinary: Negative for dysuria and hematuria.  Musculoskeletal: Negative for back pain and joint pain.  Skin: Negative for rash.  Neurological: Positive for weakness. Negative for sensory change, speech change, focal weakness and headaches.  Endo/Heme/Allergies: Does not bruise/bleed easily.  Psychiatric/Behavioral: Negative for depression. The patient is not nervous/anxious.     DRUG ALLERGIES:   Allergies  Allergen Reactions  . Sulfa Antibiotics Hives  . Biaxin [Clarithromycin] Hives  . Influenza A (H1n1) Monoval Vac Other (See Comments)    Pt states that she was told by her MD not to get the influenza vaccine.    . Morphine Other (See Comments)    Reaction:  Dizziness and confusion   . Pyridium [Phenazopyridine Hcl] Other (See Comments)    Reaction:  Unknown   . Ceftriaxone Anxiety  . Latex Rash  . Prednisone Rash  . Tape Rash    VITALS:  Blood pressure (!) 155/60, pulse 66, temperature 97.4 F (36.3 C), temperature source Oral, resp. rate 16, height 5\' 8"  (1.727 m), weight 118.5 kg (261 lb 3.9 oz), SpO2 98 %.  PHYSICAL EXAMINATION:   Physical Exam  GENERAL:  76 y.o.-year-old patient lying in  the bed with no acute distress.  EYES: Pupils equal, round, reactive to light and accommodation. No scleral icterus. Extraocular muscles intact.  HEENT: Head atraumatic, normocephalic. Oropharynx and nasopharynx clear.  NECK:  Supple, no jugular venous distention. No thyroid enlargement, no tenderness.  LUNGS: Bilateral crackles CARDIOVASCULAR: S1, S2 normal. No murmurs, rubs, or gallops.  ABDOMEN: Soft, nontender, nondistended. Bowel sounds present. No organomegaly or mass.  EXTREMITIES: No cyanosis, clubbing or edema b/l.    NEUROLOGIC: Cranial nerves II through XII are intact. No focal Motor or sensory deficits b/l.   PSYCHIATRIC: The patient is alert and oriented x 3.  SKIN: No obvious rash, lesion, or ulcer.   LABORATORY PANEL:   CBC  Recent Labs Lab 02/21/17 0917  WBC 5.8  HGB 9.6*  HCT 29.1*  PLT 320   ------------------------------------------------------------------------------------------------------------------ Chemistries   Recent Labs Lab 02/21/17 0917  NA 136  K 4.0  CL 86*  CO2 41*  GLUCOSE 199*  BUN 22*  CREATININE 1.35*  CALCIUM 9.4   ------------------------------------------------------------------------------------------------------------------  Cardiac Enzymes  Recent Labs Lab 02/21/17 0917  TROPONINI <0.03   ------------------------------------------------------------------------------------------------------------------  RADIOLOGY:  Dg Chest Port 1 View  Result Date: 02/21/2017 CLINICAL DATA:  Shortness of breath, patient is on home oxygen and eggs is exhibiting hypoxia. History of COPD and diabetes and CHF. Former smoker. EXAM: PORTABLE CHEST 1 VIEW COMPARISON:  Portable chest x-ray of January 28, 2017 FINDINGS: The lungs are reasonably well inflated. The interstitial markings have improved. The cardiac silhouette is decreased in size. Small amount of  pleural fluid on the left persists. There is calcification in the wall of the aortic arch.  The bony structures are unremarkable. IMPRESSION: Improving CHF.  No discrete pneumonia. Thoracic aortic atherosclerosis. Electronically Signed   By: David  Martinique M.D.   On: 02/21/2017 09:46     ASSESSMENT AND PLAN:   76 year old female with past medical history of anxiety, depression, hypertension, diabetes, obstructive sleep apnea, chronic respiratory failure, chronic diastolic CHF, history of recurrent UTIs, who presents to the hospital due to shortness of breath and hypoxia.  * Acute on chronic respiratory failure with Hypercarbia and hypoxia-secondary to volume overload with CHF Wean O2 as tolerated Nebs PRN  * Acute on chronic diastolic CHF. - IV Lasix, Beta blockers - Input and Output - Counseled to limit fluids and Salt - Monitor Bun/Cr and Potassium -Cardiology follow up after discharge   * Diabetes type 2 without complication-continue Lantus, sliding scale insulin, Actos. Follow blood sugars.  * History of seizures-continue Keppra, Lamictal.  * History of depression with catatonia-continue Zyprexa.  All the records are reviewed and case discussed with Care Management/Social Worker Management plans discussed with the patient, family and they are in agreement.  CODE STATUS: DNR  DVT Prophylaxis: SCDs  TOTAL TIME TAKING CARE OF THIS PATIENT: 35 minutes.   POSSIBLE D/C IN 1-2 DAYS, DEPENDING ON CLINICAL CONDITION.  Hillary Bow R M.D on 02/22/2017 at 11:21 AM  Between 7am to 6pm - Pager - [phone]  After 6pm go to [email or web address] - password [email or web address] [facility]  [facility]  Office  [phone]  CC: Primary care physician; Leonel Ramsay, MD  Note: This dictation was prepared with Dragon dictation along with smaller phrase technology. Any transcriptional errors that result from this process are unintentional.

## 2017-02-23 LAB — BASIC METABOLIC PANEL
Anion gap: 10 (ref 5–15)
BUN: 30 mg/dL — AB (ref 6–20)
CHLORIDE: 85 mmol/L — AB (ref 101–111)
CO2: 42 mmol/L — AB (ref 22–32)
CREATININE: 1.59 mg/dL — AB (ref 0.44–1.00)
Calcium: 9.4 mg/dL (ref 8.9–10.3)
GFR calc Af Amer: 36 mL/min — ABNORMAL LOW (ref >60)
GFR calc non Af Amer: 31 mL/min — ABNORMAL LOW (ref >60)
GLUCOSE: 267 mg/dL — AB (ref 65–99)
POTASSIUM: 4.3 mmol/L (ref 3.5–5.1)
SODIUM: 137 mmol/L (ref 135–145)

## 2017-02-23 LAB — GLUCOSE, CAPILLARY
GLUCOSE-CAPILLARY: 182 mg/dL — AB (ref 65–99)
GLUCOSE-CAPILLARY: 293 mg/dL — AB (ref 65–99)
Glucose-Capillary: 239 mg/dL — ABNORMAL HIGH (ref 65–99)
Glucose-Capillary: 266 mg/dL — ABNORMAL HIGH (ref 65–99)

## 2017-02-23 LAB — MAGNESIUM: MAGNESIUM: 2.1 mg/dL (ref 1.7–2.4)

## 2017-02-23 MED ORDER — TRAMADOL HCL 50 MG PO TABS: 50.0000 mg | ORAL_TABLET | Freq: Four times a day (QID) | ORAL | 0 refills | Status: DC | PRN

## 2017-02-23 MED ORDER — ACETAZOLAMIDE 250 MG PO TABS
500.0000 mg | ORAL_TABLET | Freq: Once | ORAL | Status: AC
  Administered 2017-02-23: 500 mg via ORAL
  Filled 2017-02-23: qty 2

## 2017-02-23 MED ORDER — LORAZEPAM 0.5 MG PO TABS: 0.5000 mg | ORAL_TABLET | Freq: Two times a day (BID) | ORAL | 0 refills | Status: DC | PRN

## 2017-02-23 MED ORDER — FUROSEMIDE 10 MG/ML IJ SOLN
40.0000 mg | Freq: Every day | INTRAMUSCULAR | Status: DC
  Administered 2017-02-24: 40 mg via INTRAVENOUS
  Filled 2017-02-23: qty 4

## 2017-02-23 NOTE — Progress Notes (Addendum)
Inpatient Diabetes Program Recommendations  AACE/ADA: New Consensus Statement on Inpatient Glycemic Control (2015)  Target Ranges:  Prepandial:   less than 140 mg/dL      Peak postprandial:   less than 180 mg/dL (1-2 hours)      Critically ill patients:  140 - 180 mg/dL   Results for Carolyn Valdez, Carolyn Valdez (MRN [id number]) as of 02/23/2017 09:28  Ref. Range 02/22/2017 09:05 02/22/2017 11:24 02/22/2017 17:01 02/22/2017 21:12 02/23/2017 08:05  Glucose-Capillary Latest Ref Range: 65 - 99 mg/dL 136 (H) 190 (H) 300 (H) 193 (H) 239 (H)    Admit with: SOB  History: DM, CHF  Home DM Meds: Lantus 43 units QHS  Current Insulin Orders: Lantus 43 units QHS      Novolog Moderate Correction Scale/ SSI (0-15 units) TID AC + HS     MD- I am not sure if patient received her dose of Lantus 43 units last PM.  Per MAR documentation, Lantus NOT charted as being given last PM.  This possibly could explain why CBG was elevated to 239 mg/dl this AM.  Would not adjust Lantus dose yet since we are unsure if Lantus was actually given last PM.  Please consider starting Novolog Meal Coverage for this patient:  Novolog 4 units TID with meals (hold if pt eats <50% of meal)     --Will follow patient during hospitalization--  Wyn Quaker RN, MSN, CDE Diabetes Coordinator Inpatient Glycemic Control Team Team Pager: [phone] (8a-5p)

## 2017-02-23 NOTE — Discharge Instructions (Addendum)
Heart Failure Clinic appointment on Mar 02, 2017 at 11:20am with Darylene Price, [place]. Please call [phone] to reschedule.   Diabetic diet.   - Daily fluids < 2 liters. - Low salt diet - Check weight everyday and keep log. Take to your doctors appt. - Take extra dose of lasix if you gain more than 3 pounds weight.  Activity as tolerated  Use oxygen as needed to keep sats greater than 92 percent

## 2017-02-23 NOTE — NC FL2 (Signed)
[place] LEVEL OF CARE SCREENING TOOL     IDENTIFICATION  Patient Name: Carolyn Valdez: [birth year] Sex: female Admission Date (Current Location): 02/21/2017  [facility] and [place] Number:  Engineering geologist and Address:  [facility], [address], [place], [place] [address]      Provider Number: [id number]  Attending Physician Name and Address:  Hillary Bow, MD  Relative Name and Phone Number:       Current Level of Care: Hospital Recommended Level of Care: [place] Prior Approval Number:    Date Approved/Denied:   PASRR Number:    Discharge Plan: SNF    Current Diagnoses: Patient Active Problem List   Diagnosis Date Noted  . Acute respiratory failure with hypoxia and hypercapnia ([place]) 02/21/2017  . Adjustment disorder with anxiety 01/28/2017  . Acute on chronic respiratory failure with hypoxia ([place]) 01/23/2017  . Palliative care encounter   . Goals of care, counseling/discussion   . DNR (do not resuscitate) 10/05/2016  . Palliative care by specialist 10/05/2016  . Depression, major, recurrent, severe with psychosis ([place]) 10/05/2016  . Severe major depression, single episode, with psychotic features ([place]) 10/04/2016  . Cellulitis of leg, right 09/29/2016  . Proctitis 09/29/2016  . Hypercapnia 09/29/2016  . Acute urinary retention 09/29/2016  . UTI (urinary tract infection) 09/29/2016  . Weakness 09/29/2016  . Subacute delirium 09/28/2016  . Gastrointestinal hemorrhage   . Diffuse abdominal pain   . Altered mental status 09/24/2016  . Pressure injury of skin 09/02/2016  . Respiratory failure with hypoxia ([place]) 09/01/2016  . Lymphedema 08/16/2016  . Acute on chronic congestive heart failure ([place]) 08/09/2016  . Acquired lymphedema of leg 04/21/2016  . Congestive heart failure ([place]) 11/25/2015  . Nocturia 11/05/2015  . Urinary frequency 11/05/2015  . Acute respiratory failure with hypoxia ([place])  09/05/2015  . 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 Incontinence 04/20/2015  . Diabetes mellitus, type 2 ([place]) 04/17/2015  . ESBL (extended spectrum beta-lactamase) producing bacteria infection 04/17/2015  . BP (high blood pressure) 04/17/2015  . Frequent UTI 04/17/2015  . Absence of bladder continence 04/17/2015  . Iron deficiency anemia 03/08/2015  . Absolute anemia 09/25/2014  . Abdominal pain, lower 09/25/2014  . Urge incontinence 02/19/2013  . FOM (frequency of micturition) 02/19/2013  . Bladder infection, chronic 08/11/2012  . Difficult or painful urination 07/26/2012  . Lower urinary tract infection 12/31/2011  . Diabetes mellitus ([place]) 12/31/2011    Orientation RESPIRATION BLADDER Height & Weight     Self, Place  Normal, O2 (3 liters) Incontinent Weight: 261 lb 3.9 oz (118.5 kg) Height:  5\' 8"  (172.7 cm)  BEHAVIORAL SYMPTOMS/MOOD NEUROLOGICAL BOWEL NUTRITION STATUS   (none)  (none) Continent Diet (carb modified)  AMBULATORY STATUS COMMUNICATION OF NEEDS Skin   Extensive Assist Verbally Normal                       Personal Care Assistance Level of Assistance  Bathing, Feeding, Dressing Bathing Assistance: Maximum assistance Feeding assistance: Limited assistance Dressing Assistance: Maximum assistance     Functional Limitations Info   (none)          SPECIAL CARE FACTORS FREQUENCY  PT (By licensed PT)                    Contractures Contractures Info: Not present    Additional Factors Info  Code Status, Allergies Code Status Info: dnr Allergies Info: sulfa abx;biaxin; morphine,  latex; prednisone; tape; flu vaccine           Current Medications (02/23/2017):  This is the current hospital active medication list Current Facility-Administered Medications  Medication Dose Route Frequency Provider Last Rate Last Dose  . acetaminophen (TYLENOL) tablet 650 mg  650 mg Oral Q6H PRN Fritzi Mandes, MD       Or  . acetaminophen (TYLENOL) suppository 650 mg  650  mg Rectal Q6H PRN Fritzi Mandes, MD      . benzonatate (TESSALON) capsule 100 mg  100 mg Oral TID PRN Fritzi Mandes, MD      . citalopram (CELEXA) tablet 10 mg  10 mg Oral Daily Fritzi Mandes, MD   10 mg at 02/23/17 0857  . enoxaparin (LOVENOX) injection 40 mg  40 mg Subcutaneous Q24H Fritzi Mandes, MD   40 mg at 02/22/17 2113  . feeding supplement (ENSURE ENLIVE) (ENSURE ENLIVE) liquid 237 mL  237 mL Oral TID BM Fritzi Mandes, MD   237 mL at 02/22/17 2129  . fluticasone (FLONASE) 50 MCG/ACT nasal spray 2 spray  2 spray Each Nare PRN Fritzi Mandes, MD      . Derrill Memo ON 02/24/2017] furosemide (LASIX) injection 40 mg  40 mg Intravenous Daily Sudini, Srikar, MD      . guaiFENesin (MUCINEX) 12 hr tablet 600 mg  600 mg Oral BID Fritzi Mandes, MD   600 mg at 02/23/17 0856  . insulin aspart (novoLOG) injection 0-15 Units  0-15 Units Subcutaneous TID WC Hillary Bow, MD   8 Units at 02/23/17 1215  . insulin aspart (novoLOG) injection 0-5 Units  0-5 Units Subcutaneous QHS Sudini, Srikar, MD      . insulin glargine (LANTUS) injection 43 Units  43 Units Subcutaneous QHS Fritzi Mandes, MD   43 Units at 02/21/17 2136  . ipratropium-albuterol (DUONEB) 0.5-2.5 (3) MG/3ML nebulizer solution 3 mL  3 mL Nebulization Q6H PRN Sudini, Alveta Heimlich, MD      . lamoTRIgine (LAMICTAL) tablet 25 mg  25 mg Oral Daily Fritzi Mandes, MD   25 mg at 02/23/17 0857  . levETIRAcetam (KEPPRA) tablet 250 mg  250 mg Oral BID Fritzi Mandes, MD   250 mg at 02/23/17 1000  . loratadine (CLARITIN) tablet 10 mg  10 mg Oral Daily Fritzi Mandes, MD   10 mg at 02/23/17 0857  . LORazepam (ATIVAN) tablet 0.5 mg  0.5 mg Oral BID Fritzi Mandes, MD   Stopped at 02/22/17 1726  . metoprolol (LOPRESSOR) tablet 50 mg  50 mg Oral BID Fritzi Mandes, MD   50 mg at 02/23/17 0856  . OLANZapine zydis (ZYPREXA) disintegrating tablet 10 mg  10 mg Oral QHS Fritzi Mandes, MD   10 mg at 02/22/17 2114  . ondansetron (ZOFRAN-ODT) disintegrating tablet 4 mg  4 mg Oral Q8H PRN Fritzi Mandes, MD   4 mg  at 02/21/17 2144  . pantoprazole (PROTONIX) EC tablet 40 mg  40 mg Oral BID Fritzi Mandes, MD   40 mg at 02/23/17 0857  . polyethylene glycol (MIRALAX / GLYCOLAX) packet 17 g  17 g Oral Daily Fritzi Mandes, MD   17 g at 02/23/17 0855  . senna (SENOKOT) tablet 8.6 mg  1 tablet Oral Daily Fritzi Mandes, MD   8.6 mg at 02/23/17 0856  . traMADol (ULTRAM) tablet 50 mg  50 mg Oral Q6H PRN Fritzi Mandes, MD       Facility-Administered Medications Ordered in Other Encounters  Medication Dose Route Frequency Provider Last Rate Last  Dose  . 0.9 %  sodium chloride infusion    Continuous PRN Dionne Bucy, CRNA         Discharge Medications: Please see discharge summary for a list of discharge medications.  Relevant Imaging Results:  Relevant Lab Results:   Additional Information ss: [id number]  Shela Leff, LCSW

## 2017-02-23 NOTE — Evaluation (Signed)
Physical Therapy Evaluation Patient Details Name: Carolyn Valdez MRN: [id number] DOB: 01/29/1941 Today's Date: 02/23/2017   History of Present Illness  presented to ER secondary to SOB; admitted with acute/chronic hypercarbic respiratory failure due to CHF, COPD exacerbation.  Of note, recent hospitalization 4/8-4/20 due to catatonic depression, respiratory failure; discharged to ALF.  Clinical Impression  Upon evaluation, patient alert and oriented; follows all commands. Generally anxious and fearful of falling. Requires increased encouragement for participation with session due to feeling "jittery" from medications.  Patient globally weak and deconditioned, requiring assist from therapist for all functional activities.  Requires min/mod assist for bed mobility; min/mod assist +2 with RW for sit/stand, basic transfers and very short-distance gait (bed/chair).  Short, choppy steps with limited balance reactions and functional endurance evident; requiring +1 hands-on assist at all times. Would benefit from skilled PT to address above deficits and promote optimal return to PLOF; recommend transition to STR upon discharge from acute hospitalization.     Follow Up Recommendations SNF    Equipment Recommendations       Recommendations for Other Services       Precautions / Restrictions Precautions Precautions: Fall Restrictions Weight Bearing Restrictions: No      Mobility  Bed Mobility Overal bed mobility: Needs Assistance Bed Mobility: Supine to Sit     Supine to sit: Min assist;Mod assist     General bed mobility comments: assist for truncal elevation (reports L wrist is "weak") and scooting to edge ofbed  Transfers Overall transfer level: Needs assistance Equipment used: Rolling walker (2 wheeled) Transfers: Sit to/from Stand Sit to Stand: Min assist;Mod assist;+2 safety/equipment         General transfer comment: assist for lift off and static standing balance; patient  very hesitant/fearful, requests +2 for comfort/confidence (though does not require for physical effort)  Ambulation/Gait Ambulation/Gait assistance: Min assist;Mod assist;+2 safety/equipment Ambulation Distance (Feet): 4 Feet Assistive device: Rolling walker (2 wheeled)       General Gait Details: forward flexed posture with broad BOS; short, choppy steps.  Refused further distances due to LEs feeling "shaky and jittery"  Stairs            Wheelchair Mobility    Modified Rankin (Stroke Patients Only)       Balance Overall balance assessment: Needs assistance Sitting-balance support: No upper extremity supported;Feet supported Sitting balance-Leahy Scale: Good     Standing balance support: Bilateral upper extremity supported Standing balance-Leahy Scale: Fair                               Pertinent Vitals/Pain Pain Assessment: No/denies pain Pain Score: 0-No pain    Home Living Family/patient expects to be discharged to:: Skilled nursing facility                 Additional Comments: Patient resident of [facility], but reports she's "not going back there" upon discharge.  Reports sister is "working on somewhere different"    Prior Function Level of Independence: Needs assistance         Comments: Assist from staff for ADLs, bed mobility and short-distance gait with RW.  Staff brings meals to room (unable to ambulate to/from dining hall).     Hand Dominance        Extremity/Trunk Assessment   Upper Extremity Assessment Upper Extremity Assessment: Overall WFL for tasks assessed    Lower Extremity Assessment Lower Extremity Assessment: Generalized weakness (  grossly 3-/5 throughout)       Communication   Communication: No difficulties  Cognition Arousal/Alertness: Awake/alert Behavior During Therapy: WFL for tasks assessed/performed;Anxious Overall Cognitive Status: Within Functional Limits for tasks assessed                                         General Comments      Exercises     Assessment/Plan    PT Assessment Patient needs continued PT services  PT Problem List Decreased strength;Decreased activity tolerance;Decreased balance;Decreased mobility;Decreased knowledge of use of DME;Decreased safety awareness;Decreased knowledge of precautions;Cardiopulmonary status limiting activity;Obesity       PT Treatment Interventions DME instruction;Functional mobility training;Therapeutic activities;Therapeutic exercise;Balance training;Patient/family education;Gait training;Stair training    PT Goals (Current goals can be found in the Care Plan section)  Acute Rehab PT Goals Patient Stated Goal: to get stronger PT Goal Formulation: With patient Time For Goal Achievement: 03/09/17 Potential to Achieve Goals: Fair    Frequency Min 2X/week   Barriers to discharge Decreased caregiver support      Co-evaluation               AM-PAC PT "6 Clicks" Daily Activity  Outcome Measure Difficulty turning over in bed (including adjusting bedclothes, sheets and blankets)?: Total Difficulty moving from lying on back to sitting on the side of the bed? : Total Difficulty sitting down on and standing up from a chair with arms (e.g., wheelchair, bedside commode, etc,.)?: Total Help needed moving to and from a bed to chair (including a wheelchair)?: A Lot Help needed walking in hospital room?: A Lot Help needed climbing 3-5 steps with a railing? : Total 6 Click Score: 8    End of Session Equipment Utilized During Treatment: Gait belt;Oxygen Activity Tolerance: Patient tolerated treatment well Patient left: in chair;with call bell/phone within reach Nurse Communication: Mobility status PT Visit Diagnosis: Muscle weakness (generalized) (M62.81);Difficulty in walking, not elsewhere classified (R26.2)    Time: 2505-3976 PT Time Calculation (min) (ACUTE ONLY): 22 min   Charges:   PT  Evaluation $PT Eval Low Complexity: 1 Procedure     PT G Codes:        [name], PT, DPT, NCS 02/23/17, 1:45 PM [phone]

## 2017-02-23 NOTE — Progress Notes (Signed)
Carolyn Valdez at [place] NAME: Carolyn Valdez    MR#:  [id number]  DATE OF BIRTH:  23-Aug-1941  SUBJECTIVE:  CHIEF COMPLAINT:   Chief Complaint  Patient presents with  . Shortness of Breath   Generalized weakness and being jittery is her main complaint.  REVIEW OF SYSTEMS:    Review of Systems  Constitutional: Positive for malaise/fatigue. Negative for chills and fever.  HENT: Negative for sore throat.   Eyes: Negative for blurred vision, double vision and pain.  Respiratory: Positive for cough and shortness of breath. Negative for hemoptysis and wheezing.   Cardiovascular: Positive for orthopnea. Negative for chest pain, palpitations and leg swelling.  Gastrointestinal: Negative for abdominal pain, constipation, diarrhea, heartburn, nausea and vomiting.  Genitourinary: Negative for dysuria and hematuria.  Musculoskeletal: Negative for back pain and joint pain.  Skin: Negative for rash.  Neurological: Positive for weakness. Negative for sensory change, speech change, focal weakness and headaches.  Endo/Heme/Allergies: Does not bruise/bleed easily.  Psychiatric/Behavioral: Negative for depression. The patient is not nervous/anxious.     DRUG ALLERGIES:   Allergies  Allergen Reactions  . Sulfa Antibiotics Hives  . Biaxin [Clarithromycin] Hives  . Influenza A (H1n1) Monoval Vac Other (See Comments)    Pt states that she was told by her MD not to get the influenza vaccine.    . Morphine Other (See Comments)    Reaction:  Dizziness and confusion   . Pyridium [Phenazopyridine Hcl] Other (See Comments)    Reaction:  Unknown   . Ceftriaxone Anxiety  . Latex Rash  . Prednisone Rash  . Tape Rash    VITALS:  Blood pressure (!) 170/55, pulse 80, temperature 97.7 F (36.5 C), temperature source Oral, resp. rate 18, height 5\' 8"  (1.727 m), weight 118.5 kg (261 lb 3.9 oz), SpO2 97 %.  PHYSICAL EXAMINATION:   Physical Exam  GENERAL:  76  y.o.-year-old patient lying in the bed with no acute distress.  EYES: Pupils equal, round, reactive to light and accommodation. No scleral icterus. Extraocular muscles intact.  HEENT: Head atraumatic, normocephalic. Oropharynx and nasopharynx clear.  NECK:  Supple, no jugular venous distention. No thyroid enlargement, no tenderness.  LUNGS: Bilateral crackles CARDIOVASCULAR: S1, S2 normal. No murmurs, rubs, or gallops.  ABDOMEN: Soft, nontender, nondistended. Bowel sounds present. No organomegaly or mass.  EXTREMITIES: No cyanosis, clubbing or edema b/l.    NEUROLOGIC: Cranial nerves II through XII are intact. No focal Motor or sensory deficits b/l.   PSYCHIATRIC: The patient is alert and awake SKIN: No obvious rash, lesion, or ulcer.   LABORATORY PANEL:   CBC  Recent Labs Lab 02/21/17 0917  WBC 5.8  HGB 9.6*  HCT 29.1*  PLT 320   ------------------------------------------------------------------------------------------------------------------ Chemistries   Recent Labs Lab 02/23/17 0511  NA 137  K 4.3  CL 85*  CO2 42*  GLUCOSE 267*  BUN 30*  CREATININE 1.59*  CALCIUM 9.4  MG 2.1   ------------------------------------------------------------------------------------------------------------------  Cardiac Enzymes  Recent Labs Lab 02/21/17 0917  TROPONINI <0.03   ------------------------------------------------------------------------------------------------------------------  RADIOLOGY:  No results found.   ASSESSMENT AND PLAN:   76 year old female with past medical history of anxiety, depression, hypertension, diabetes, obstructive sleep apnea, chronic respiratory failure, chronic diastolic CHF, history of recurrent UTIs, who presents to the hospital due to shortness of breath and hypoxia.  * Acute respiratory failure with Hypercarbia and hypoxia-secondary to volume overload with CHF Still needing 2-3 L/min. Not on O2 normally Nebs PRN  *  Acute on  chronic diastolic CHF - IV Lasix, Beta blockers. Reduce dose of Lasix due to worsening creatinine. - Input and Output. - Counseled to limit fluids and Salt. - Monitor Bun/Cr and Potassium. -Cardiology follow up after discharge.  * Diabetes type 2 without complication-continue Lantus, sliding scale insulin, Actos. Follow blood sugars.  * History of seizures-continue Keppra, Lamictal.  * History of depression with catatonia-continue Zyprexa.  Generalized weakness. Physical therapy consulted. Will likely need SNF.  All the records are reviewed and case discussed with Care Management/Social Worker. Management plans discussed with the patient, family and they are in agreement.  CODE STATUS: DNR  DVT Prophylaxis: SCDs  TOTAL TIME TAKING CARE OF THIS PATIENT: 35 minutes.   POSSIBLE D/C IN 1-2 DAYS, DEPENDING ON CLINICAL CONDITION.  Hillary Bow R M.D on 02/23/2017 at 11:34 AM  Between 7am to 6pm - Pager - [phone]  After 6pm go to [email or web address] - password [email or web address] [facility]  [facility]  Office  [phone]  CC: Primary care physician; Leonel Ramsay, MD  Note: This dictation was prepared with Dragon dictation along with smaller phrase technology. Any transcriptional errors that result from this process are unintentional.

## 2017-02-23 NOTE — Progress Notes (Signed)
Patient up to chair on 3LO2.. RN unable to wean patient.   Deri Fuelling, RN

## 2017-02-23 NOTE — Clinical Social Work Note (Signed)
Clinical Social Work Assessment  Patient Details  Name: Carolyn Valdez MRN: [id number] Date of Birth: [birth year]  Date of referral:  02/23/17               Reason for consult:  Facility Placement, Discharge Planning                Permission sought to share information with:  Family Supports Permission granted to share information::  Yes, Verbal Permission Granted  Name::     Carolyn Valdez  Relationship::  Sister  Contact Information:  [phone]  Housing/Transportation Living arrangements for the past 2 months:  [place], [place] of Information:  Other (Comment Required) (sister) Patient Interpreter Needed:  None Criminal Activity/Legal Involvement Pertinent to Current Situation/Hospitalization:  No - Comment as needed Significant Relationships:  Siblings Lives with:  Self Do you feel safe going back to the place where you live?  No Need for family participation in patient care:  Yes (Comment)  Care giving concerns:  Pt is in need of a higher level of long term care.   Social Worker assessment / plan:  CSW spoke with pt's sister to address consult as pt is not fully oriented. CSW consulted for discharge planning needs. Pt was admitted from [place] however is in need of SNF. CSW introduced herself and explained role of social work. CSW also explained process of discharging to SNF for LTC. PT consult is pending. Pt's sister would like for pt to attempt rehab. Pt's sister will be cleaning pt's room out at [facility] and is interested in [place] [facility]. CSW explained the limitations of STR placement initially and then transitioning to LTC. CSW will initiate SNF search and follow up with bed offers. CSW will continue to follow.   Employment status:  Retired Nurse, adult PT Recommendations:  Not assessed at this time Information / Referral to community resources:  [place]  Patient/Family's Response  to care:  Pt's sister was Patent attorney of CSW support.   Patient/Family's Understanding of and Emotional Response to Diagnosis, Current Treatment, and Prognosis: Pt's sister understands that pt is in need of SNF placement.   Emotional Assessment Appearance:  Appears stated age Attitude/Demeanor/Rapport:  Other Affect (typically observed):  Other Orientation:  Oriented to Self, Oriented to Situation Alcohol / Substance use:  Not Applicable Psych involvement (Current and /or in the community):  No (Comment)  Discharge Needs  Concerns to be addressed:  Adjustment to Illness Readmission within the last 30 days:  Yes Current discharge risk:  Chronically ill Barriers to Discharge:  Continued Medical Work up   Darden Dates, [place] 02/23/2017, 12:33 PM

## 2017-02-23 NOTE — Progress Notes (Signed)
Pt. Refuses to wear BIPAP in spite of coaching and encouragement. Benefits explained but pt. Still refuses. She has been tried on BIPAP multiple times in the past as well but will not comply with therapy.

## 2017-02-23 NOTE — Clinical Social Work Placement (Signed)
   CLINICAL SOCIAL WORK PLACEMENT  NOTE  Date:  02/23/2017  Patient Details  Name: Carolyn Valdez MRN: [id number] Date of Birth: 03/30/1941  Clinical Social Work is seeking post-discharge placement for this patient at the [place] level of care (*CSW will initial, date and re-position this form in  chart as items are completed):  Yes   Patient/family provided with [place] Work Department's list of facilities offering this level of care within the geographic area requested by the patient (or if unable, by the patient's family).  Yes   Patient/family informed of their freedom to choose among providers that offer the needed level of care, that participate in Medicare, Medicaid or managed care program needed by the patient, have an available bed and are willing to accept the patient.  Yes   Patient/family informed of [facility]'s ownership interest in [facility] and [facility], as well as of the fact that they are under no obligation to receive care at these facilities.  PASRR submitted to EDS on       PASRR number received on       Existing PASRR number confirmed on 02/23/17     FL2 transmitted to all facilities in geographic area requested by pt/family on 02/23/17     FL2 transmitted to all facilities within larger geographic area on       Patient informed that his/her managed care company has contracts with or will negotiate with certain facilities, including the following:            Patient/family informed of bed offers received.  Patient chooses bed at       Physician recommends and patient chooses bed at      Patient to be transferred to   on  .  Patient to be transferred to facility by       Patient family notified on   of transfer.  Name of family member notified:        PHYSICIAN       Additional Comment:    _______________________________________________ Darden Dates, LCSW 02/23/2017, 12:23 PM

## 2017-02-24 LAB — BASIC METABOLIC PANEL
ANION GAP: 5 (ref 5–15)
BUN: 31 mg/dL — ABNORMAL HIGH (ref 6–20)
CHLORIDE: 89 mmol/L — AB (ref 101–111)
CO2: 41 mmol/L — AB (ref 22–32)
Calcium: 9.5 mg/dL (ref 8.9–10.3)
Creatinine, Ser: 1.37 mg/dL — ABNORMAL HIGH (ref 0.44–1.00)
GFR calc non Af Amer: 37 mL/min — ABNORMAL LOW (ref >60)
GFR, EST AFRICAN AMERICAN: 43 mL/min — AB (ref >60)
Glucose, Bld: 297 mg/dL — ABNORMAL HIGH (ref 65–99)
Potassium: 3.9 mmol/L (ref 3.5–5.1)
Sodium: 135 mmol/L (ref 135–145)

## 2017-02-24 LAB — GLUCOSE, CAPILLARY
GLUCOSE-CAPILLARY: 267 mg/dL — AB (ref 65–99)
GLUCOSE-CAPILLARY: 241 mg/dL — AB (ref 65–99)

## 2017-02-24 LAB — MAGNESIUM: Magnesium: 2.2 mg/dL (ref 1.7–2.4)

## 2017-02-24 MED ORDER — LORAZEPAM 0.5 MG PO TABS: 0.5000 mg | ORAL_TABLET | Freq: Two times a day (BID) | ORAL | Status: DC | PRN

## 2017-02-24 MED ORDER — LORAZEPAM 0.5 MG PO TABS: 0.5000 mg | ORAL_TABLET | Freq: Two times a day (BID) | ORAL | 0 refills | Status: DC | PRN

## 2017-02-24 NOTE — Progress Notes (Signed)
Report called to [facility] @  [facility]. EMS called for transportation

## 2017-02-24 NOTE — Progress Notes (Addendum)
Inpatient Diabetes Program Recommendations  AACE/ADA: New Consensus Statement on Inpatient Glycemic Control (2015)  Target Ranges:  Prepandial:   less than 140 mg/dL      Peak postprandial:   less than 180 mg/dL (1-2 hours)      Critically ill patients:  140 - 180 mg/dL   Lab Results  Component Value Date   GLUCAP 267 (H) 02/24/2017   HGBA1C 6.8 (H) 08/12/2016    Review of Glycemic Control  Results for Carolyn Valdez, Carolyn Valdez (MRN [id number]) as of 02/24/2017 11:14  Ref. Range 02/23/2017 08:05 02/23/2017 11:36 02/23/2017 16:33 02/23/2017 21:27 02/24/2017 07:35  Glucose-Capillary Latest Ref Range: 65 - 99 mg/dL 239 (H) 266 (H) 182 (H) 293 (H) 267 (H)   History: DM Type 2  Home DM Meds: Lantus 43 units QHS  Current Insulin Orders: Lantus 43 units QHS                                       Novolog Moderate Correction Scale/ SSI (0-15 units) TID       Novolog 0-5 units qhs  Post prandial blood sugars remain elevated; consider starting Novolog Meal Coverage for this patient: Novolog 4 units TID with meals (hold if pt eats <50% of meal)  Fasting blood sugar 267mg /dl, consider increasing Lantus to 47 units qhs (0.4units/kg)  Gentry Fitz, RN, [place], [place], CDE Diabetes Coordinator Inpatient Diabetes Program  [phone] (Team Pager) [phone] ([place]) 02/24/2017 11:20 AM

## 2017-02-24 NOTE — Clinical Social Work Note (Signed)
Pt is ready for discharge today and will discharge to [facility]. Pt and sister are aware and agreeable to discharge plan. Facility is ready to admit pt as discharge information has been received. RN called report. [facility] will provide transportation. CSW is signing off as no further needs identified.   Darden Dates, MSW, LCSW  Clinical Social Worker [phone]

## 2017-02-24 NOTE — Discharge Summary (Signed)
[place] at [place] NAME: Carolyn Valdez    MR#:  [id number]  DATE OF BIRTH:  09-Mar-1941  DATE OF ADMISSION:  02/21/2017 ADMITTING PHYSICIAN: Fritzi Mandes, MD  DATE OF DISCHARGE: 02/24/17  PRIMARY CARE PHYSICIAN: Leonel Ramsay, MD    ADMISSION DIAGNOSIS:  Acute respiratory failure with hypoxia and hypercapnia (HCC) [J96.01, J96.02]  DISCHARGE DIAGNOSIS:  Acute on chronic respiratory failure with hypoxia and hypercapnia CHF and acute on chronicdiastolic  SECONDARY DIAGNOSIS:   Past Medical History:  Diagnosis Date  . Anxiety   . Breast cancer ([place])    16 yrs ago  . CHF (congestive heart failure) ([place])   . Diabetes mellitus without complication ([place])   . DJD (degenerative joint disease)   . Dysuria   . H/O total knee replacement    right  . HTN (hypertension)   . Hypercholesteremia   . Kidney stone   . Obesity   . Recurrent urinary tract infection   . SVT (supraventricular tachycardia) ([place])   . Urinary incontinence   . Vaginal atrophy   . Yeast vaginitis     HOSPITAL COURSE:   76 year old female with past medical history of anxiety, depression, hypertension, diabetes, obstructive sleep apnea, chronic respiratory failure, chronic diastolic CHF, history of recurrent UTIs, who presents to the hospital due to shortness of breath and hypoxia.  * Acute On chronic respiratory failure with Hypercarbiaand hypoxia-secondary to volume overload with CHF -Patient desaturates to 80% on room air. Sats 92% on 2 L nasal cannula oxygen. Patient does qualify for home oxygen need. We'll set it up. Nebs PRN  * Acute on chronic diastolic CHF - Received IV Lasix, Beta blockers. -Patient now on Lasix 40 mg twice a day - Input and Output. - Counseled to limit fluids and Salt. - Monitor Bun/Cr and Potassium. -Cardiology follow up after discharge.  * Diabetes type 2 without complication-continue Lantus, sliding scale insulin, Actos.  Follow blood sugars.  * History of seizures-continue Keppra, Lamictal.  * History of depression with catatonia-continue Zyprexa.  *Generalized weakness. Physical therapy consulted. Recommends SNF Patient has been offered from hospital. [facility] discharge her since she is at her baseline.  CONSULTS OBTAINED:  Treatment Team:  Flora Lipps, MD  DRUG ALLERGIES:   Allergies  Allergen Reactions  . Sulfa Antibiotics Hives  . Biaxin [Clarithromycin] Hives  . Influenza A (H1n1) Monoval Vac Other (See Comments)    Pt states that she was told by her MD not to get the influenza vaccine.    . Morphine Other (See Comments)    Reaction:  Dizziness and confusion   . Pyridium [Phenazopyridine Hcl] Other (See Comments)    Reaction:  Unknown   . Ceftriaxone Anxiety  . Latex Rash  . Prednisone Rash  . Tape Rash    DISCHARGE MEDICATIONS:   Current Discharge Medication List    CONTINUE these medications which have CHANGED   Details  LORazepam (ATIVAN) 0.5 MG tablet Take 1 tablet (0.5 mg total) by mouth 2 (two) times daily as needed for anxiety. Qty: 10 tablet, Refills: 0    traMADol (ULTRAM) 50 MG tablet Take 1 tablet (50 mg total) by mouth every 6 (six) hours as needed. Qty: 12 tablet, Refills: 0      CONTINUE these medications which have NOT CHANGED   Details  acetaminophen (TYLENOL) 650 MG CR tablet Take 650 mg by mouth every 6 (six) hours as needed for pain.    atorvastatin (LIPITOR)  20 MG tablet Take 20 mg by mouth daily.     benzonatate (TESSALON) 100 MG capsule Take 100 mg by mouth 3 (three) times daily as needed for cough.    citalopram (CELEXA) 10 MG tablet Take 1 tablet (10 mg total) by mouth daily. Qty: 30 tablet, Refills: 0    docusate sodium (COLACE) 100 MG capsule Take 100 mg by mouth 2 (two) times daily.     fluticasone (FLONASE) 50 MCG/ACT nasal spray Place 2 sprays into the nose as needed.    furosemide (LASIX) 40 MG tablet Take 1 tablet (40 mg total) by mouth  2 (two) times daily. Qty: 60 tablet, Refills: 0    guaiFENesin (MUCINEX) 600 MG 12 hr tablet Take 600 mg by mouth 2 (two) times daily.    hydrOXYzine (ATARAX/VISTARIL) 25 MG tablet Take 1 tablet by mouth at bedtime.     insulin glargine (LANTUS) 100 UNIT/ML injection Inject 0.43 mLs (43 Units total) into the skin at bedtime. Qty: 15 mL, Refills: 0    ipratropium-albuterol (DUONEB) 0.5-2.5 (3) MG/3ML SOLN Take 3 mLs by nebulization 3 (three) times daily. Qty: 360 mL, Refills: 0    iron polysaccharides (NIFEREX) 150 MG capsule Take 150 mg by mouth 3 (three) times daily.     lamoTRIgine (LAMICTAL) 25 MG tablet Take 25 mg by mouth daily.    levETIRAcetam (KEPPRA) 250 MG tablet Take 1 tablet (250 mg total) by mouth 2 (two) times daily. Qty: 30 tablet, Refills: 0    loratadine (CLARITIN) 10 MG tablet Take 10 mg by mouth daily.    metoprolol (LOPRESSOR) 50 MG tablet Take 50 mg by mouth 2 (two) times daily.    nitrofurantoin, macrocrystal-monohydrate, (MACROBID) 100 MG capsule Take 1 capsule (100 mg total) by mouth daily. Qty: 30 capsule, Refills: 0    OLANZapine zydis (ZYPREXA) 10 MG disintegrating tablet Take 1 tablet (10 mg total) by mouth at bedtime. Qty: 30 tablet, Refills: 0    ondansetron (ZOFRAN ODT) 4 MG disintegrating tablet Take 1 tablet (4 mg total) by mouth every 8 (eight) hours as needed for nausea or vomiting. Qty: 20 tablet, Refills: 0    pantoprazole (PROTONIX) 40 MG tablet Take 40 mg by mouth 2 (two) times daily.    polyethylene glycol (MIRALAX / GLYCOLAX) packet Take 17 g by mouth daily.    senna (SENOKOT) 8.6 MG TABS tablet Take 1 tablet (8.6 mg total) by mouth daily. Qty: 120 each, Refills: 0    vitamin E 1000 UNIT capsule Take 1,000 Units by mouth daily.    feeding supplement, ENSURE ENLIVE, (ENSURE ENLIVE) LIQD Take 237 mLs by mouth 3 (three) times daily between meals. Qty: 237 mL, Refills: 12      STOP taking these medications     insulin aspart  (NOVOLOG) 100 UNIT/ML injection      insulin aspart (NOVOLOG) 100 UNIT/ML injection      nystatin (MYCOSTATIN/NYSTOP) powder         If you experience worsening of your admission symptoms, develop shortness of breath, life threatening emergency, suicidal or homicidal thoughts you must seek medical attention immediately by calling 911 or calling your MD immediately  if symptoms less severe.  You Must read complete instructions/literature along with all the possible adverse reactions/side effects for all the Medicines you take and that have been prescribed to you. Take any new Medicines after you have completely understood and accept all the possible adverse reactions/side effects.   Please note  You were cared  for by a hospitalist during your hospital stay. If you have any questions about your discharge medications or the care you received while you were in the hospital after you are discharged, you can call the unit and asked to speak with the hospitalist on call if the hospitalist that took care of you is not available. Once you are discharged, your primary care physician will handle any further medical issues. Please note that NO REFILLS for any discharge medications will be authorized once you are discharged, as it is imperative that you return to your primary care physician (or establish a relationship with a primary care physician if you do not have one) for your aftercare needs so that they can reassess your need for medications and monitor your lab values. Today   SUBJECTIVE   Doing ok. No respiratory distress. Ate good breakfast.  VITAL SIGNS:  Blood pressure (!) 160/55, pulse 73, temperature 98 F (36.7 C), temperature source Oral, resp. rate 18, height 5\' 8"  (1.727 m), weight 118.2 kg (260 lb 9.3 oz), SpO2 93 %.  I/O:   Intake/Output Summary (Last 24 hours) at 02/24/17 1139 Last data filed at 02/24/17 0500  Gross per 24 hour  Intake              480 ml  Output                 0 ml  Net              480 ml    PHYSICAL EXAMINATION:  GENERAL:  76 y.o.-year-old patient lying in the bed with no acute distress. Morbidly obese EYES: Pupils equal, round, reactive to light and accommodation. No scleral icterus. Extraocular muscles intact.  HEENT: Head atraumatic, normocephalic. Oropharynx and nasopharynx clear.  NECK:  Supple, no jugular venous distention. No thyroid enlargement, no tenderness.  LUNGS: Decreased breath sounds bilaterally, no wheezing, rales,rhonchi or crepitation. No use of accessory muscles of respiration.  CARDIOVASCULAR: S1, S2 normal. No murmurs, rubs, or gallops.  ABDOMEN: Soft, non-tender, non-distended. Bowel sounds present. No organomegaly or mass.  EXTREMITIES:+ pedal edema, no cyanosis, or clubbing.  NEUROLOGIC: Cranial nerves II through XII are intact. Muscle strength 5/5 in all extremities. Sensation intact. Gait not checked.  PSYCHIATRIC: The patient is alert and oriented x 3.  SKIN: No obvious rash, lesion, or ulcer.   DATA REVIEW:   CBC   Recent Labs Lab 02/21/17 0917  WBC 5.8  HGB 9.6*  HCT 29.1*  PLT 320    Chemistries   Recent Labs Lab 02/24/17 0543  NA 135  K 3.9  CL 89*  CO2 41*  GLUCOSE 297*  BUN 31*  CREATININE 1.37*  CALCIUM 9.5  MG 2.2    Microbiology Results   Recent Results (from the past 240 hour(s))  MRSA PCR Screening     Status: None   Collection Time: 02/21/17  4:10 PM  Result Value Ref Range Status   MRSA by PCR NEGATIVE NEGATIVE Final    Comment:        The GeneXpert MRSA Assay (FDA approved for NASAL specimens only), is one component of a comprehensive MRSA colonization surveillance program. It is not intended to diagnose MRSA infection nor to guide or monitor treatment for MRSA infections.     RADIOLOGY:  No results found.   Management plans discussed with the patient, family and they are in agreement.  CODE STATUS:     Code Status Orders  Start     Ordered    02/21/17 1629  Do not attempt resuscitation (DNR)  Continuous    Question Answer Comment  In the event of cardiac or respiratory ARREST Do not call a "code blue"   In the event of cardiac or respiratory ARREST Do not perform Intubation, CPR, defibrillation or ACLS   In the event of cardiac or respiratory ARREST Use medication by any route, position, wound care, and other measures to relive pain and suffering. May use oxygen, suction and manual treatment of airway obstruction as needed for comfort.      02/21/17 1629    Code Status History    Date Active Date Inactive Code Status Order ID Comments User Context   01/31/2017  4:17 PM 02/04/2017  1:50 PM DNR [id number]  Max Sane, MD Inpatient   01/23/2017  1:20 PM 01/31/2017  4:17 PM Full Code [id number]  Henreitta Leber, MD Inpatient   10/05/2016 12:16 PM 10/26/2016  5:40 PM DNR [id number]  Knox Royalty, NP Inpatient   09/24/2016  5:31 PM 10/05/2016 12:16 PM Full Code [id number]  Gladstone Lighter, MD Inpatient   09/01/2016  2:17 AM 09/05/2016  4:28 PM Full Code [id number]  Hugelmeyer, [place], DO Inpatient   08/09/2016  8:27 PM 08/12/2016  8:15 PM Full Code [id number]  Henreitta Leber, MD Inpatient   09/30/2015  2:23 PM 10/07/2015  5:37 PM Full Code [id number]  Loletha Grayer, MD ED   09/05/2015  5:56 PM 09/09/2015  5:38 PM DNR [id number]  Loletha Grayer, MD ED    Advance Directive Documentation     Most Recent Value  Type of Advance Directive  Out of facility DNR (pink MOST or yellow form)  Pre-existing out of facility DNR order (yellow form or pink MOST form)  Yellow form placed in chart (order not valid for inpatient use)  "MOST" Form in Place?  -      TOTAL TIME TAKING CARE OF THIS PATIENT: 40 minutes.    Dorella Laster M.D on 02/24/2017 at 11:39 AM  Between 7am to 6pm - Pager - [phone] After 6pm go to [email or web address] - password [email or web address] [place] [facility]  Office  [phone]  CC: Primary care physician; Leonel Ramsay, MD

## 2017-02-24 NOTE — Progress Notes (Signed)
SATURATION QUALIFICATIONS:   Patient Saturations on Room Air at Rest = 83%  Patient Saturations on 2 Liters of oxygen while Ambulating = 93%  Please briefly explain why patient needs oxygen: desaturation in O2

## 2017-02-24 NOTE — Progress Notes (Signed)
EMS here to transport pt. 

## 2017-03-02 ENCOUNTER — Telehealth: Payer: Self-pay | Admitting: Family

## 2017-03-02 ENCOUNTER — Ambulatory Visit: Payer: Medicare Other | Admitting: Family

## 2017-03-02 NOTE — Telephone Encounter (Signed)
Patient missed her initial appointment at the [facility] on 03/02/17. This is the 3rd appointment she has missed.

## 2017-04-29 ENCOUNTER — Telehealth: Payer: Self-pay

## 2017-04-29 NOTE — Telephone Encounter (Signed)
-----   Message from [place], [place] sent at 02/28/2017  4:30 PM EDT ----- Regarding: RE: Mammogram   ----- Message ----- From: Wayna Chalet, CMA Sent: 02/22/2017 To: Wayna Chalet, CMA Subject: FW: Mammogram                                  Spoke to patient's sister and she stated that her sister had not been doing good on her health. Her sister wanted me to contact patient in two months to check if she might be back home. So I told her that I would.  ----- Message ----- From: Wayna Chalet, CMA Sent: 12/15/2016 To: Wayna Chalet, CMA Subject: RE: Mammogram                                  Patient's Mammogram was scheduled to 12/15/2016 at [facility] at 1:00 pm. Then she will follow up with Dr. Adonis Huguenin on 12/22/2016. Her appt. Reminder was mailed.  ----- Message ----- From: Wayna Chalet, CMA Sent: 11/08/2016 To: Wayna Chalet, CMA Subject: Mammogram                                      Call [facility] and schedule patient her Diagnostic Mammogram and then her follow-up appointment with Dr. Azalee Course.

## 2017-04-29 NOTE — Telephone Encounter (Signed)
Called patient again and she did not answer my call. I left her a detailed message to call our office to schedule her mammogram and then follow up with our surgeon.  A letter was sent on 12/16/2016 and patient did not reach out to [place].

## 2017-05-09 ENCOUNTER — Other Ambulatory Visit: Payer: Self-pay | Admitting: General Surgery

## 2017-05-09 ENCOUNTER — Ambulatory Visit
Admission: RE | Admit: 2017-05-09 | Discharge: 2017-05-09 | Disposition: A | Discharge status: Home / Self Care | Payer: Medicare Other | Source: Ambulatory Visit | Attending: General Surgery | Admitting: General Surgery

## 2017-05-09 DIAGNOSIS — Z853 Personal history of malignant neoplasm of breast: Secondary | ICD-10-CM

## 2017-05-09 DIAGNOSIS — Z1231 Encounter for screening mammogram for malignant neoplasm of breast: Secondary | ICD-10-CM | POA: Insufficient documentation

## 2017-05-30 IMAGING — MR MR HEAD W/O CM
11 series · 48 of 48 positions shown · non-contrast
Comparison: CT of the head without contrast 10/21/2016. MRI brain
09/30/2016

CLINICAL DATA: Seizure.  Fever.

EXAM:
MRI HEAD WITHOUT CONTRAST
TECHNIQUE: Multiplanar, multiecho pulse sequences of the brain and surrounding
structures were obtained without intravenous contrast.

[Series 4: DWI · axial · 3.0mm · 1.80mm/px · z∈[-38,+120]mm · 7 of 55 slices shown (1 of 2)]
[im 1/55]
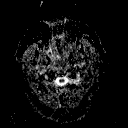
[im 10/55]
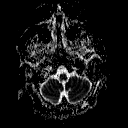
[im 19/55]
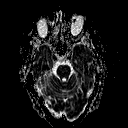
[im 28/55]
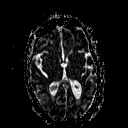
[im 37/55]
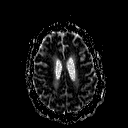
[im 46/55]
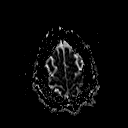
[im 55/55]
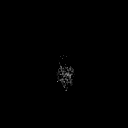

[Series 6: DWI · coronal · 3.0mm · 1.80mm/px · 6 of 49 slices shown (2 of 2)]
[im 1/49]
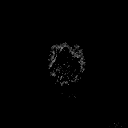
[im 10/49]
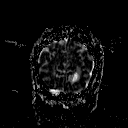
[im 20/49]
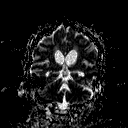
[im 29/49]
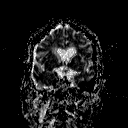
[im 39/49]
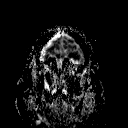
[im 49/49]
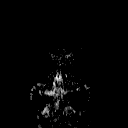

[Series 7: T2 · axial · 5.0mm · 0.90mm/px · z∈[-41,+124]mm · 3 of 27 slices shown (1 of 4)]
[im 1/27]
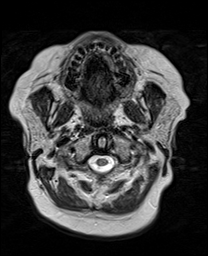
[im 14/27]
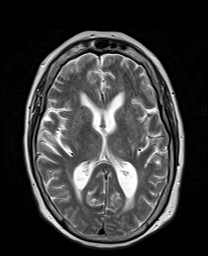
[im 27/27]
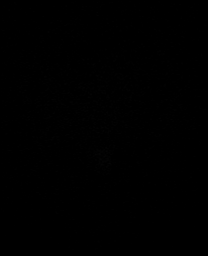

[Series 8: FLAIR · axial · 5.0mm · 0.45mm/px · z∈[-41,+124]mm · 3 of 27 slices shown]
[im 1/27]
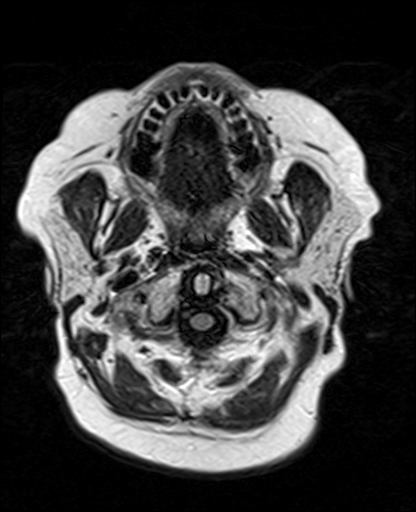
[im 14/27]
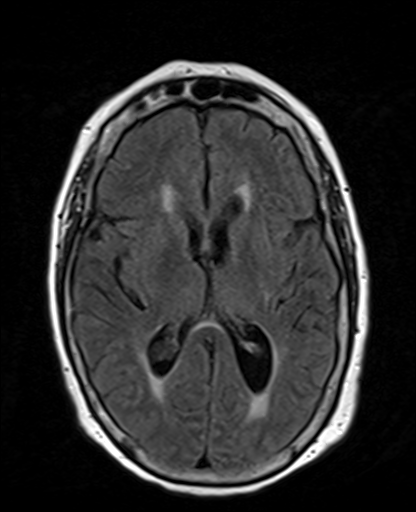
[im 27/27]
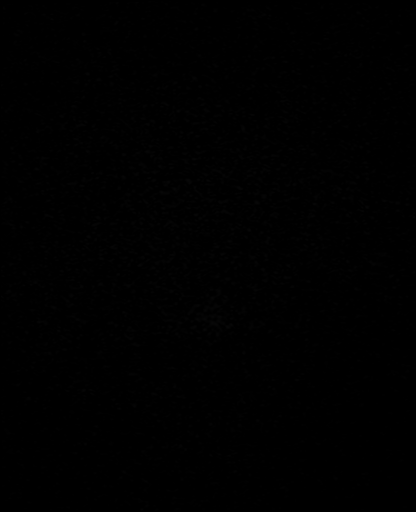

[Series 9: T2 · axial · 5.0mm · 0.45mm/px · z∈[-41,+124]mm · 3 of 27 slices shown (2 of 4)]
[im 1/27]
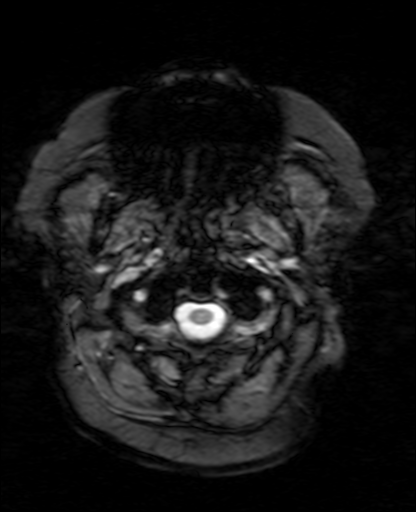
[im 14/27]
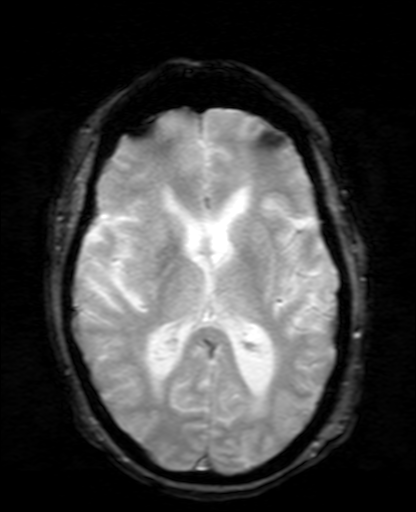
[im 27/27]
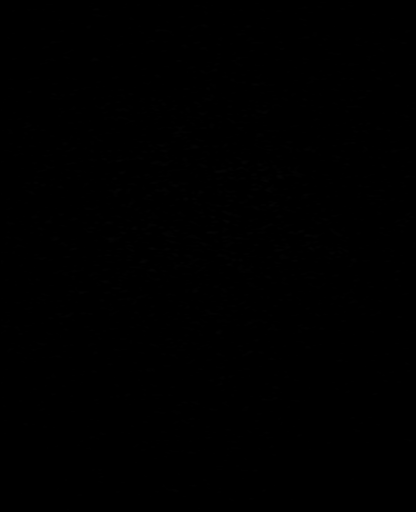

[Series 10: GRE · axial · 5.0mm · 0.45mm/px · z∈[-41,+124]mm · 3 of 27 slices shown (1 of 2)]
[im 1/27]
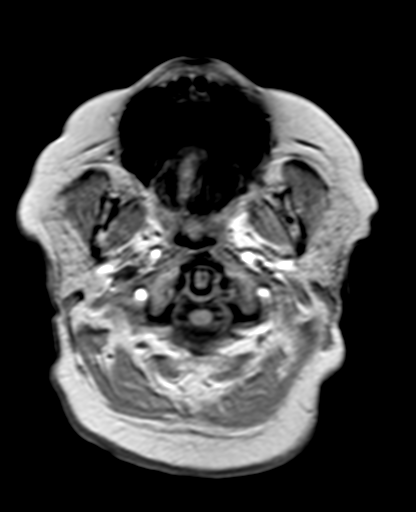
[im 14/27]
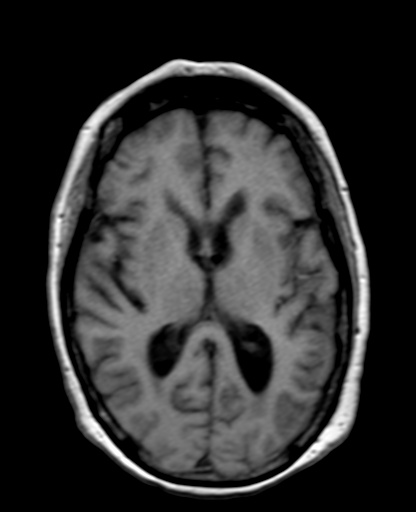
[im 27/27]
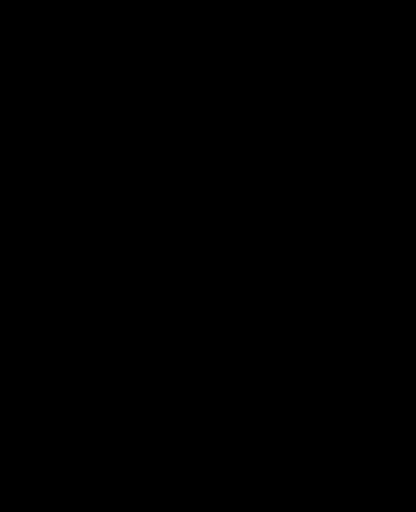

[Series 11: T2 · coronal · 5.0mm · 0.86mm/px · 4 of 31 slices shown (3 of 4)]
[im 1/31]
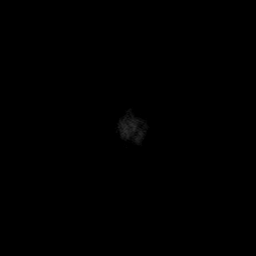
[im 11/31]
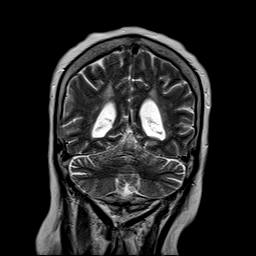
[im 21/31]
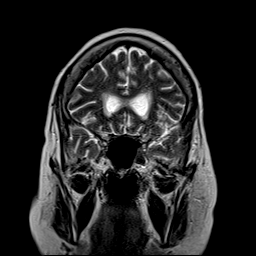
[im 31/31]
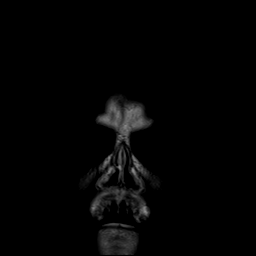

[Series 12: GRE · sagittal · 5.0mm · 0.45mm/px · 3 of 29 slices shown (2 of 2)]
[im 1/29]
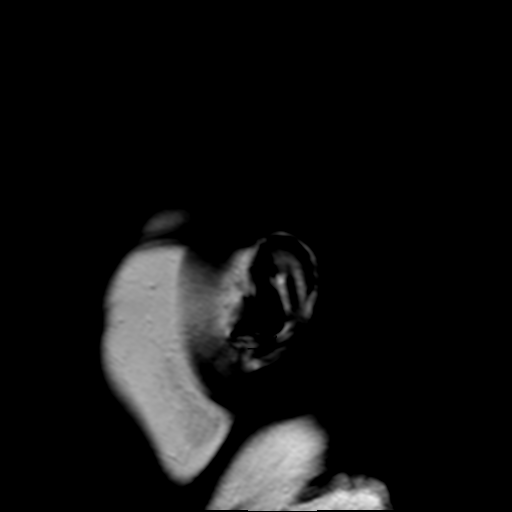
[im 15/29]
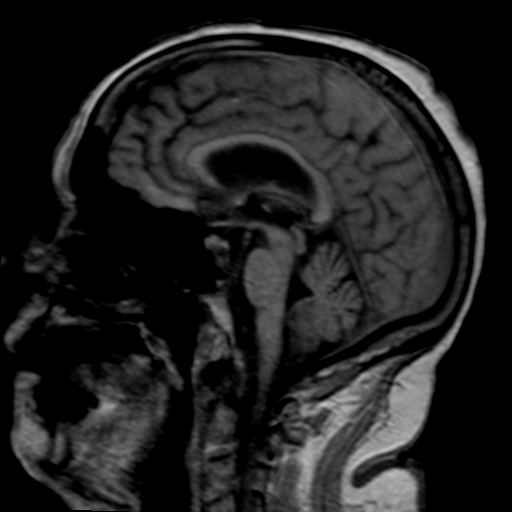
[im 29/29]
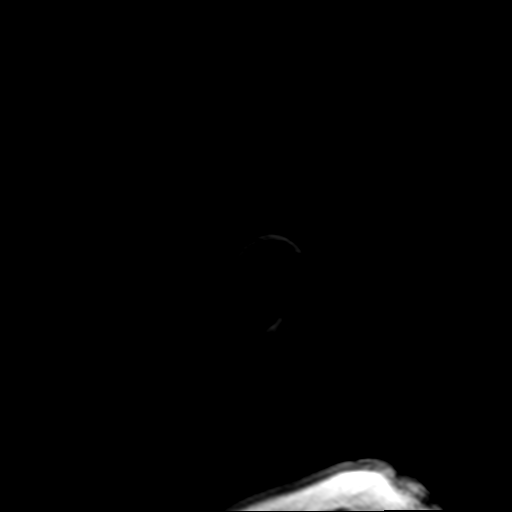

[Series 13: T2 · coronal · 3.0mm · 0.47mm/px · 3 of 24 slices shown (4 of 4)]
[im 1/24]
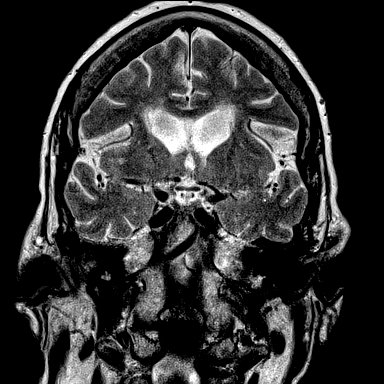
[im 12/24]
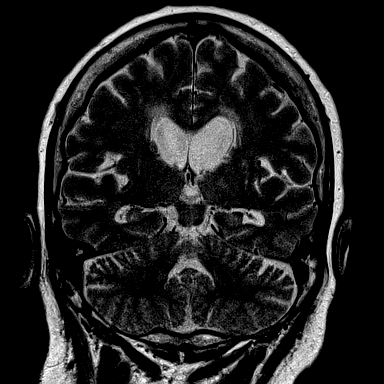
[im 24/24]
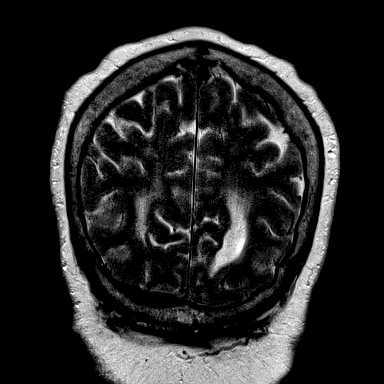

[Series 100: ax (id) · axial · 3.0mm · 1.80mm/px · z∈[-38,+120]mm · 7 of 55 slices shown]
[im 1/55]
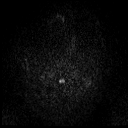
[im 10/55]
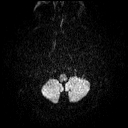
[im 19/55]
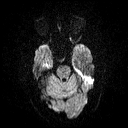
[im 28/55]
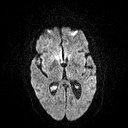
[im 37/55]
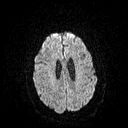
[im 46/55]
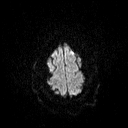
[im 55/55]
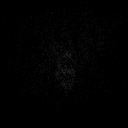

[Series 101: cor (id) · coronal · 3.0mm · 1.80mm/px · 6 of 47 slices shown]
[im 1/47]
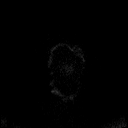
[im 10/47]
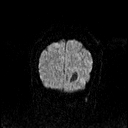
[im 19/47]
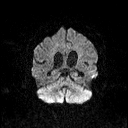
[im 28/47]
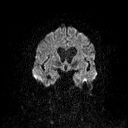
[im 37/47]
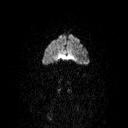
[im 47/47]
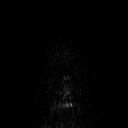

[48 of 48 positions shown; findings below may reference images not displayed]

FINDINGS: Brain: Moderate diffuse periventricular and subcortical T2 changes
again seen bilaterally. Remote lacunar infarcts are present within
the basal ganglia.

Dedicated imaging of the temporal lobes demonstrate symmetric T2
changes within the hippocampi. No discrete mass lesion is present.

Vascular: The flow is present in the major intracranial arteries.

Skull and upper cervical spine: The skullbase is within normal
limits. The craniocervical junction is unremarkable. Midline
sagittal structures are within normal limits.

Sinuses/Orbits: Minimal fluid is present in the right maxillary
sinus. There is mild mucosal thickening along the floor of the left
maxillary sinus. The paranasal sinuses are otherwise clear. The
mastoid air cells are clear.
IMPRESSION: 1. No acute or focal lesion to explain seizures.
2. Moderate generalized atrophy and white matter disease. This
likely reflects the sequela of chronic microvascular ischemia.

## 2018-01-04 ENCOUNTER — Other Ambulatory Visit: Payer: Self-pay

## 2018-01-16 ENCOUNTER — Ambulatory Visit (INDEPENDENT_AMBULATORY_CARE_PROVIDER_SITE_OTHER): Payer: Medicare Other | Admitting: Urology

## 2018-01-16 ENCOUNTER — Encounter: Payer: Self-pay | Admitting: Urology

## 2018-01-16 VITALS — BP 134/75 | HR 47 | Ht 68.0 in

## 2018-01-16 DIAGNOSIS — R300 Dysuria: Secondary | ICD-10-CM

## 2018-01-16 DIAGNOSIS — N390 Urinary tract infection, site not specified: Secondary | ICD-10-CM

## 2018-01-16 LAB — MICROSCOPIC EXAMINATION
Epithelial Cells (non renal): >10 /hpf — ABNORMAL HIGH (ref 0–10)
WBC, UA: >30 /hpf — ABNORMAL HIGH (ref 0–5)

## 2018-01-16 LAB — URINALYSIS, COMPLETE
BILIRUBIN UA: NEGATIVE
Ketones, UA: NEGATIVE
Nitrite, UA: POSITIVE — AB
PH UA: 5.5 (ref 5.0–7.5)
Specific Gravity, UA: 1.02 (ref 1.005–1.030)
Urobilinogen, Ur: 0.2 mg/dL (ref 0.2–1.0)

## 2018-01-16 NOTE — Progress Notes (Signed)
01/16/2018 1:30 PM   Carolyn Valdez 1941/06/18 [id number]  Referring provider: Leonel Ramsay, MD [address] [place], [place] [address]  Chief Complaint  Patient presents with  . Recurrent UTI    HPI: Carolyn Valdez 2017: The patient had been on Vesicare Myrbetriq and oral desmopressin and had failed percutaneous tibial nerve stimulation.  She had had positive urine cultures.  She had medical comorbidities and obesity.  The patient believes that her bladder infection symptoms are high-volume incontinence burning and foul-smelling urine.  She describes recently taking monurol and then ciprofloxacin.  She still is symptomatic.  She was taken to the hospital last fall and apparently she did not have a firm diagnosis and I did not see any cultures.  The last 2 cultures over the last year on the chart have been negative.  She thinks she gets about 6 infections a year in spite of taking Macrodantin once a day.  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  She is in a wheelchair.  She has medical comorbidities.  Modifying factors: There are no other modifying factors  Associated signs and symptoms: There are no other associated signs and symptoms Aggravating and relieving factors: There are no other aggravating or relieving factors Severity: Moderate Duration: Persistent   PMH: Past Medical History:  Diagnosis Date  . Anxiety   . Breast cancer ([place]) 1997   right breast mastectomy  . CHF (congestive heart failure) ([place])   . Diabetes mellitus without complication ([place])   . DJD (degenerative joint disease)   . Dysuria   . H/O total knee replacement    right  . HTN (hypertension)   . Hypercholesteremia   . Kidney stone   . Obesity   . Recurrent urinary tract infection   . SVT (supraventricular tachycardia) ([place])   . Urinary incontinence   . Vaginal atrophy   . Yeast vaginitis     Surgical History: Past Surgical History:    Procedure Laterality Date  . CARDIAC CATHETERIZATION    . CHOLECYSTECTOMY    . FOOT SURGERY    . JOINT REPLACEMENT    . MASTECTOMY Right 1997  . NASAL SEPTUM SURGERY    . PERIPHERAL VASCULAR CATHETERIZATION N/A 07/08/2015   Procedure: PICC Line Insertion;  Surgeon: Algernon Huxley, MD;  Location: [facility];  Service: Cardiovascular;  Laterality: N/A;  . TONSILLECTOMY    . Vocal cords      Home Medications:  Allergies as of 01/16/2018      Reactions   Sulfa Antibiotics Hives   Biaxin [clarithromycin] Hives   Influenza A (h1n1) Monoval Vac Other (See Comments)   Pt states that she was told by her MD not to get the influenza vaccine.     Morphine Other (See Comments)   Reaction:  Dizziness and confusion    Pyridium [phenazopyridine Hcl] Other (See Comments)   Reaction:  Unknown    Ceftriaxone Anxiety   Latex Rash   Prednisone Rash   Tape Rash      Medication List        Accurate as of 01/16/18  1:30 PM. Always use your most recent med list.          acetaminophen 650 MG CR tablet Commonly known as:  TYLENOL Take 650 mg by mouth every 6 (six) hours as needed for pain.   atorvastatin 20 MG tablet Commonly known as:  LIPITOR Take 20 mg by mouth daily.  benzonatate 100 MG capsule Commonly known as:  TESSALON Take 100 mg by mouth 3 (three) times daily as needed for cough.   citalopram 10 MG tablet Commonly known as:  CELEXA Take 1 tablet (10 mg total) by mouth daily.   docusate sodium 100 MG capsule Commonly known as:  COLACE Take 100 mg by mouth 2 (two) times daily.   feeding supplement (ENSURE ENLIVE) Liqd Take 237 mLs by mouth 3 (three) times daily between meals.   fluticasone 50 MCG/ACT nasal spray Commonly known as:  FLONASE Place 2 sprays into the nose as needed.   furosemide 40 MG tablet Commonly known as:  LASIX Take 1 tablet (40 mg total) by mouth 2 (two) times daily.   guaiFENesin 600 MG 12 hr tablet Commonly known as:  MUCINEX Take 600 mg  by mouth 2 (two) times daily.   hydrOXYzine 25 MG tablet Commonly known as:  ATARAX/VISTARIL Take 1 tablet by mouth at bedtime.   insulin glargine 100 UNIT/ML injection Commonly known as:  LANTUS Inject 0.43 mLs (43 Units total) into the skin at bedtime.   ipratropium-albuterol 0.5-2.5 (3) MG/3ML Soln Commonly known as:  DUONEB Take 3 mLs by nebulization 3 (three) times daily.   iron polysaccharides 150 MG capsule Commonly known as:  NIFEREX Take 150 mg by mouth 3 (three) times daily.   lamoTRIgine 25 MG tablet Commonly known as:  LAMICTAL Take 25 mg by mouth daily.   levETIRAcetam 250 MG tablet Commonly known as:  KEPPRA Take 1 tablet (250 mg total) by mouth 2 (two) times daily.   loratadine 10 MG tablet Commonly known as:  CLARITIN Take 10 mg by mouth daily.   LORazepam 0.5 MG tablet Commonly known as:  ATIVAN Take 1 tablet (0.5 mg total) by mouth 2 (two) times daily as needed for anxiety.   metoprolol tartrate 50 MG tablet Commonly known as:  LOPRESSOR Take 50 mg by mouth 2 (two) times daily.   nitrofurantoin (macrocrystal-monohydrate) 100 MG capsule Commonly known as:  MACROBID Take 1 capsule (100 mg total) by mouth daily.   OLANZapine zydis 10 MG disintegrating tablet Commonly known as:  ZYPREXA Take 1 tablet (10 mg total) by mouth at bedtime.   ondansetron 4 MG disintegrating tablet Commonly known as:  ZOFRAN ODT Take 1 tablet (4 mg total) by mouth every 8 (eight) hours as needed for nausea or vomiting.   pantoprazole 40 MG tablet Commonly known as:  PROTONIX Take 40 mg by mouth 2 (two) times daily.   polyethylene glycol packet Commonly known as:  MIRALAX / GLYCOLAX Take 17 g by mouth daily.   senna 8.6 MG Tabs tablet Commonly known as:  SENOKOT Take 1 tablet (8.6 mg total) by mouth daily.   traMADol 50 MG tablet Commonly known as:  ULTRAM Take 1 tablet (50 mg total) by mouth every 6 (six) hours as needed.   vitamin E 1000 UNIT capsule Take  1,000 Units by mouth daily.       Allergies:  Allergies  Allergen Reactions  . Sulfa Antibiotics Hives  . Biaxin [Clarithromycin] Hives  . Influenza A (H1n1) Monoval Vac Other (See Comments)    Pt states that she was told by her MD not to get the influenza vaccine.    . Morphine Other (See Comments)    Reaction:  Dizziness and confusion   . Pyridium [Phenazopyridine Hcl] Other (See Comments)    Reaction:  Unknown   . Ceftriaxone Anxiety  . Latex Rash  .  Prednisone Rash  . Tape Rash    Family History: Family History  Problem Relation Age of Onset  . Lung cancer Father   . Hematuria Mother   . Lung cancer Mother   . Kidney disease Neg Hx   . Bladder Cancer Neg Hx   . Breast cancer Neg Hx     Social History:  reports that she has quit smoking. She quit smokeless tobacco use about 22 years ago. She reports that she does not drink alcohol or use drugs.  ROS:                                        Physical Exam: BP 134/75 (BP Location: Left Arm, Patient Position: Sitting, Cuff Size: Large)   Pulse (!) 47   Ht 5\' 8"  (1.727 m)   BMI 39.62 kg/m   Constitutional:  Alert and oriented, No acute distress. HEENT: [place] AT, moist mucus membranes.  Trachea midline, no masses. Cardiovascular: No clubbing, cyanosis, or edema. Respiratory: Normal respiratory effort, no increased work of breathing. GI: Abdomen is soft, nontender, nondistended, no abdominal masses GU: No CVA tenderness.  Skin: No rashes, bruises or suspicious lesions. Lymph: No cervical or inguinal adenopathy. Neurologic: Grossly intact, no focal deficits, moving all 4 extremities. Psychiatric: Normal mood and affect.  Laboratory Data: Lab Results  Component Value Date   WBC 5.8 02/21/2017   HGB 9.6 (L) 02/21/2017   HCT 29.1 (L) 02/21/2017   MCV 82.4 02/21/2017   PLT 320 02/21/2017    Lab Results  Component Value Date   CREATININE 1.37 (H) 02/24/2017    No results found for:  PSA  No results found for: TESTOSTERONE  Lab Results  Component Value Date   HGBA1C 6.8 (H) 08/12/2016    Urinalysis    Component Value Date/Time   COLORURINE YELLOW (A) 01/30/2017 0827   APPEARANCEUR CLOUDY (A) 01/30/2017 0827   APPEARANCEUR Cloudy (A) 11/24/2015 1213   LABSPEC 1.012 01/30/2017 0827   LABSPEC 1.008 09/08/2014 1719   PHURINE 6.0 01/30/2017 0827   GLUCOSEU NEGATIVE 01/30/2017 0827   GLUCOSEU Negative 09/08/2014 1719   HGBUR NEGATIVE 01/30/2017 0827   BILIRUBINUR NEGATIVE 01/30/2017 0827   BILIRUBINUR Negative 11/24/2015 1213   BILIRUBINUR Negative 09/08/2014 1719   KETONESUR NEGATIVE 01/30/2017 0827   PROTEINUR 100 (A) 01/30/2017 0827   NITRITE NEGATIVE 01/30/2017 0827   LEUKOCYTESUR LARGE (A) 01/30/2017 0827   LEUKOCYTESUR Negative 11/24/2015 1213   LEUKOCYTESUR 3+ 09/08/2014 1719    Pertinent Imaging: none  Assessment & Plan: It is difficult to say if the patient is truly getting bacterial cystitis on suppression therapy.  She has stopped the Macrobid for now.  I ordered a renal ultrasound and she will come back for cystoscopy.  I sent the urine for culture.  Likely will need to put her back on suppressive therapies and get cultures in the future.  I think trimethoprim may be reasonable to try.  I will left asked her if she is ever had Keflex.  Likely there will not be additional treatments available for her refractory urgency incontinence especially with her functional mobility.  1. Dysuria 2.  Urinary tract infections, unspecified - Urinalysis, Complete   No follow-ups on file.  Reece Packer, MD  [facility] [address] [place], [place] [address] [phone]

## 2018-01-19 LAB — CULTURE, URINE COMPREHENSIVE

## 2018-01-20 ENCOUNTER — Telehealth: Payer: Self-pay | Admitting: Urology

## 2018-01-20 NOTE — Telephone Encounter (Signed)
Pt called office LMOM asking for results of urine culture given at her visit on Monday.  Please call pt. Thank.s

## 2018-01-23 MED ORDER — CIPROFLOXACIN HCL 250 MG PO TABS: 250.0000 mg | ORAL_TABLET | Freq: Two times a day (BID) | ORAL | 7 refills | Status: DC

## 2018-01-23 NOTE — Telephone Encounter (Signed)
-----   Message from [name], MD sent at 01/23/2018  8:31 AM EDT ----- Ciprofloxacin 250 mg bid for 7 days   ----- Message ----- From: [name], CMA Sent: 01/20/2018   8:27 AM To: [name], MD    ----- Message ----- From: Interface, [facility] Lab Results In Sent: 01/16/2018   4:37 PM To: [name] Clinical

## 2018-01-23 NOTE — Telephone Encounter (Signed)
Patient notified and will start ABX

## 2018-02-01 ENCOUNTER — Ambulatory Visit
Admission: RE | Admit: 2018-02-01 | Discharge: 2018-02-01 | Disposition: A | Discharge status: Home / Self Care | Payer: Medicare Other | Source: Ambulatory Visit | Attending: Urology | Admitting: Urology

## 2018-02-01 DIAGNOSIS — N390 Urinary tract infection, site not specified: Secondary | ICD-10-CM | POA: Insufficient documentation

## 2018-02-06 ENCOUNTER — Ambulatory Visit: Payer: Medicare Other | Admitting: Urology

## 2018-02-06 ENCOUNTER — Encounter: Payer: Self-pay | Admitting: Urology

## 2018-02-06 VITALS — BP 109/60 | HR 51 | Ht 68.0 in | Wt 256.0 lb

## 2018-02-06 DIAGNOSIS — N3020 Other chronic cystitis without hematuria: Secondary | ICD-10-CM | POA: Diagnosis not present

## 2018-02-06 MED ORDER — NITROFURANTOIN MACROCRYSTAL 100 MG PO CAPS: 100.0000 mg | ORAL_CAPSULE | Freq: Every day | ORAL | 11 refills | Status: DC

## 2018-02-06 MED ORDER — CIPROFLOXACIN HCL 250 MG PO TABS: 250.0000 mg | ORAL_TABLET | Freq: Two times a day (BID) | ORAL | 7 refills | Status: DC

## 2018-02-06 NOTE — Progress Notes (Signed)
02/06/2018 1:39 PM   Carolyn Valdez 1941/03/09 [id number]  Referring provider: Leonel Ramsay, MD [address] [place], [place] [address]  Chief Complaint  Patient presents with  . Cysto    HPI: Carolyn Valdez 2017: The patient had been on Vesicare Myrbetriq and oral desmopressin and had failed percutaneous tibial nerve stimulation.  She had had positive urine cultures.  She had medical comorbidities and obesity.  The patient believes that her bladder infection symptoms are high-volume incontinence burning and foul-smelling urine.  She describes recently taking monurol and then ciprofloxacin.  She still is symptomatic.  She was taken to the hospital last fall and apparently she did not have a firm diagnosis and I did not see any cultures.  The last 2 cultures over the last year on the chart have been negative.  She thinks she gets about 6 infections a year in spite of taking Macrodantin once a day.  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  It is difficult to say if the patient is truly getting bacterial cystitis on suppression therapy.  She has stopped the Macrobid for now.  I ordered a renal ultrasound and she will come back for cystoscopy.  I sent the urine for culture.  Likely will need to put her back on suppressive therapies and get cultures in the future.  I think trimethoprim may be reasonable to try.  I will left asked her if she is ever had Keflex.  Likely there will not be additional treatments available for her refractory urgency incontinence especially with her functional mobility.  Today Last urine culture positive and treated.  Renal ultrasound normal  After written and verbal consent patient underwent cystoscopy.  Examination was somewhat difficult due to obesity and position on table.  Scope was inserted.  Urine is very cloudy with white flecks.  There was pale erythema.  It was difficult to see the entire bladder.  She  started leaking around the scope.  Trigone looked normal     PMH: Past Medical History:  Diagnosis Date  . Anxiety   . Breast cancer ([place]) 1997   right breast mastectomy  . CHF (congestive heart failure) ([place])   . Diabetes mellitus without complication ([place])   . DJD (degenerative joint disease)   . Dysuria   . H/O total knee replacement    right  . HTN (hypertension)   . Hypercholesteremia   . Kidney stone   . Obesity   . Recurrent urinary tract infection   . SVT (supraventricular tachycardia) ([place])   . Urinary incontinence   . Vaginal atrophy   . Yeast vaginitis     Surgical History: Past Surgical History:  Procedure Laterality Date  . CARDIAC CATHETERIZATION    . CHOLECYSTECTOMY    . FOOT SURGERY    . JOINT REPLACEMENT    . MASTECTOMY Right 1997  . NASAL SEPTUM SURGERY    . PERIPHERAL VASCULAR CATHETERIZATION N/A 07/08/2015   Procedure: PICC Line Insertion;  Surgeon: Algernon Huxley, MD;  Location: [facility];  Service: Cardiovascular;  Laterality: N/A;  . TONSILLECTOMY    . Vocal cords      Home Medications:  Allergies as of 02/06/2018      Reactions   Sulfa Antibiotics Hives   Biaxin [clarithromycin] Hives   Influenza A (h1n1) Monoval Vac Other (See Comments)   Pt states that she was told by her MD not to get the influenza vaccine.  Morphine Other (See Comments)   Reaction:  Dizziness and confusion    Pyridium [phenazopyridine Hcl] Other (See Comments)   Reaction:  Unknown    Ceftriaxone Anxiety   Latex Rash   Prednisone Rash   Tape Rash      Medication List        Accurate as of 02/06/18  1:39 PM. Always use your most recent med list.          acetaminophen 650 MG CR tablet Commonly known as:  TYLENOL Take 650 mg by mouth every 6 (six) hours as needed for pain.   atorvastatin 20 MG tablet Commonly known as:  LIPITOR Take 20 mg by mouth daily.   benzonatate 100 MG capsule Commonly known as:  TESSALON Take 100 mg by mouth 3  (three) times daily as needed for cough.   ciprofloxacin 250 MG tablet Commonly known as:  CIPRO Take 1 tablet (250 mg total) by mouth 2 (two) times daily.   citalopram 10 MG tablet Commonly known as:  CELEXA Take 1 tablet (10 mg total) by mouth daily.   docusate sodium 100 MG capsule Commonly known as:  COLACE Take 100 mg by mouth 2 (two) times daily.   feeding supplement (ENSURE ENLIVE) Liqd Take 237 mLs by mouth 3 (three) times daily between meals.   fluticasone 50 MCG/ACT nasal spray Commonly known as:  FLONASE Place 2 sprays into the nose as needed.   furosemide 40 MG tablet Commonly known as:  LASIX Take 1 tablet (40 mg total) by mouth 2 (two) times daily.   guaiFENesin 600 MG 12 hr tablet Commonly known as:  MUCINEX Take 600 mg by mouth 2 (two) times daily.   hydrOXYzine 25 MG tablet Commonly known as:  ATARAX/VISTARIL Take 1 tablet by mouth at bedtime.   insulin glargine 100 UNIT/ML injection Commonly known as:  LANTUS Inject 0.43 mLs (43 Units total) into the skin at bedtime.   ipratropium-albuterol 0.5-2.5 (3) MG/3ML Soln Commonly known as:  DUONEB Take 3 mLs by nebulization 3 (three) times daily.   iron polysaccharides 150 MG capsule Commonly known as:  NIFEREX Take 150 mg by mouth 3 (three) times daily.   lamoTRIgine 25 MG tablet Commonly known as:  LAMICTAL Take 25 mg by mouth daily.   levETIRAcetam 250 MG tablet Commonly known as:  KEPPRA Take 1 tablet (250 mg total) by mouth 2 (two) times daily.   loratadine 10 MG tablet Commonly known as:  CLARITIN Take 10 mg by mouth daily.   LORazepam 0.5 MG tablet Commonly known as:  ATIVAN Take 1 tablet (0.5 mg total) by mouth 2 (two) times daily as needed for anxiety.   metoprolol tartrate 50 MG tablet Commonly known as:  LOPRESSOR Take 50 mg by mouth 2 (two) times daily.   nitrofurantoin (macrocrystal-monohydrate) 100 MG capsule Commonly known as:  MACROBID Take 1 capsule (100 mg total) by mouth  daily.   OLANZapine zydis 10 MG disintegrating tablet Commonly known as:  ZYPREXA Take 1 tablet (10 mg total) by mouth at bedtime.   ondansetron 4 MG disintegrating tablet Commonly known as:  ZOFRAN ODT Take 1 tablet (4 mg total) by mouth every 8 (eight) hours as needed for nausea or vomiting.   pantoprazole 40 MG tablet Commonly known as:  PROTONIX Take 40 mg by mouth 2 (two) times daily.   polyethylene glycol packet Commonly known as:  MIRALAX / GLYCOLAX Take 17 g by mouth daily.   senna 8.6 MG Tabs tablet  Commonly known as:  SENOKOT Take 1 tablet (8.6 mg total) by mouth daily.   traMADol 50 MG tablet Commonly known as:  ULTRAM Take 1 tablet (50 mg total) by mouth every 6 (six) hours as needed.   vitamin E 1000 UNIT capsule Take 1,000 Units by mouth daily.       Allergies:  Allergies  Allergen Reactions  . Sulfa Antibiotics Hives  . Biaxin [Clarithromycin] Hives  . Influenza A (H1n1) Monoval Vac Other (See Comments)    Pt states that she was told by her MD not to get the influenza vaccine.    . Morphine Other (See Comments)    Reaction:  Dizziness and confusion   . Pyridium [Phenazopyridine Hcl] Other (See Comments)    Reaction:  Unknown   . Ceftriaxone Anxiety  . Latex Rash  . Prednisone Rash  . Tape Rash    Family History: Family History  Problem Relation Age of Onset  . Lung cancer Father   . Hematuria Mother   . Lung cancer Mother   . Kidney disease Neg Hx   . Bladder Cancer Neg Hx   . Breast cancer Neg Hx     Social History:  reports that she has quit smoking. She quit smokeless tobacco use about 22 years ago. She reports that she does not drink alcohol or use drugs.  ROS:                                        Physical Exam: There were no vitals taken for this visit.  Constitutional:  Alert and oriented, No acute distress.  Laboratory Data: Lab Results  Component Value Date   WBC 5.8 02/21/2017   HGB 9.6 (L)  02/21/2017   HCT 29.1 (L) 02/21/2017   MCV 82.4 02/21/2017   PLT 320 02/21/2017    Lab Results  Component Value Date   CREATININE 1.37 (H) 02/24/2017    No results found for: PSA  No results found for: TESTOSTERONE  Lab Results  Component Value Date   HGBA1C 6.8 (H) 08/12/2016    Urinalysis    Component Value Date/Time   COLORURINE YELLOW (A) 01/30/2017 0827   APPEARANCEUR Cloudy (A) 01/16/2018 1349   LABSPEC 1.012 01/30/2017 0827   LABSPEC 1.008 09/08/2014 1719   PHURINE 6.0 01/30/2017 0827   GLUCOSEU Trace (A) 01/16/2018 1349   GLUCOSEU Negative 09/08/2014 1719   HGBUR NEGATIVE 01/30/2017 0827   BILIRUBINUR Negative 01/16/2018 1349   BILIRUBINUR Negative 09/08/2014 1719   KETONESUR NEGATIVE 01/30/2017 0827   PROTEINUR 3+ (A) 01/16/2018 1349   PROTEINUR 100 (A) 01/30/2017 0827   NITRITE Positive (A) 01/16/2018 1349   NITRITE NEGATIVE 01/30/2017 0827   LEUKOCYTESUR 3+ (A) 01/16/2018 1349   LEUKOCYTESUR 3+ 09/08/2014 1719    Pertinent Imaging: As noted  Assessment & Plan: 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  Incontinence will be difficult to control with her bladder chronically infected and with her immobility etc. above.  The urine was aspirated and sent for culture but it may be diluted denies any potential false negative.  I would like to put her back on daily Macrodantin.  Reassess in about 3 months.  I also went ahead and gave her a week of ciprofloxacin based upon the last culture   There are no diagnoses linked to this encounter.  No follow-ups on file.  Reece Packer, MD  [facility] [address] [place], [place] [address] [phone]

## 2018-02-08 LAB — CULTURE, URINE COMPREHENSIVE

## 2018-04-22 ENCOUNTER — Other Ambulatory Visit: Payer: Self-pay

## 2018-04-22 ENCOUNTER — Observation Stay: Payer: Medicare Other

## 2018-04-22 ENCOUNTER — Emergency Department: Payer: Medicare Other

## 2018-04-22 ENCOUNTER — Inpatient Hospital Stay
Admission: EM | Admit: 2018-04-22 | Discharge: 2018-04-26 | DRG: 308 | Disposition: A | Discharge status: Skilled Nursing Facility | Payer: Medicare Other | Attending: Internal Medicine | Admitting: Internal Medicine

## 2018-04-22 DIAGNOSIS — L03116 Cellulitis of left lower limb: Secondary | ICD-10-CM | POA: Diagnosis present

## 2018-04-22 DIAGNOSIS — R569 Unspecified convulsions: Secondary | ICD-10-CM | POA: Diagnosis present

## 2018-04-22 DIAGNOSIS — N184 Chronic kidney disease, stage 4 (severe): Secondary | ICD-10-CM | POA: Diagnosis present

## 2018-04-22 DIAGNOSIS — I272 Pulmonary hypertension, unspecified: Secondary | ICD-10-CM | POA: Diagnosis present

## 2018-04-22 DIAGNOSIS — I471 Supraventricular tachycardia: Secondary | ICD-10-CM | POA: Diagnosis present

## 2018-04-22 DIAGNOSIS — I248 Other forms of acute ischemic heart disease: Secondary | ICD-10-CM | POA: Diagnosis present

## 2018-04-22 DIAGNOSIS — R52 Pain, unspecified: Secondary | ICD-10-CM

## 2018-04-22 DIAGNOSIS — Z888 Allergy status to other drugs, medicaments and biological substances status: Secondary | ICD-10-CM

## 2018-04-22 DIAGNOSIS — N179 Acute kidney failure, unspecified: Secondary | ICD-10-CM | POA: Diagnosis present

## 2018-04-22 DIAGNOSIS — Z882 Allergy status to sulfonamides status: Secondary | ICD-10-CM

## 2018-04-22 DIAGNOSIS — I4891 Unspecified atrial fibrillation: Secondary | ICD-10-CM | POA: Diagnosis not present

## 2018-04-22 DIAGNOSIS — Z87442 Personal history of urinary calculi: Secondary | ICD-10-CM

## 2018-04-22 DIAGNOSIS — Z79899 Other long term (current) drug therapy: Secondary | ICD-10-CM

## 2018-04-22 DIAGNOSIS — Z886 Allergy status to analgesic agent status: Secondary | ICD-10-CM

## 2018-04-22 DIAGNOSIS — Z91040 Latex allergy status: Secondary | ICD-10-CM

## 2018-04-22 DIAGNOSIS — I130 Hypertensive heart and chronic kidney disease with heart failure and stage 1 through stage 4 chronic kidney disease, or unspecified chronic kidney disease: Secondary | ICD-10-CM | POA: Diagnosis present

## 2018-04-22 DIAGNOSIS — F202 Catatonic schizophrenia: Secondary | ICD-10-CM | POA: Diagnosis present

## 2018-04-22 DIAGNOSIS — Z881 Allergy status to other antibiotic agents status: Secondary | ICD-10-CM

## 2018-04-22 DIAGNOSIS — F0390 Unspecified dementia without behavioral disturbance: Secondary | ICD-10-CM | POA: Diagnosis present

## 2018-04-22 DIAGNOSIS — F329 Major depressive disorder, single episode, unspecified: Secondary | ICD-10-CM | POA: Diagnosis present

## 2018-04-22 DIAGNOSIS — Z66 Do not resuscitate: Secondary | ICD-10-CM | POA: Diagnosis present

## 2018-04-22 DIAGNOSIS — Z87440 Personal history of urinary (tract) infections: Secondary | ICD-10-CM

## 2018-04-22 DIAGNOSIS — Z9011 Acquired absence of right breast and nipple: Secondary | ICD-10-CM

## 2018-04-22 DIAGNOSIS — R55 Syncope and collapse: Secondary | ICD-10-CM | POA: Diagnosis not present

## 2018-04-22 DIAGNOSIS — Z96659 Presence of unspecified artificial knee joint: Secondary | ICD-10-CM | POA: Diagnosis present

## 2018-04-22 DIAGNOSIS — Z885 Allergy status to narcotic agent status: Secondary | ICD-10-CM

## 2018-04-22 DIAGNOSIS — E669 Obesity, unspecified: Secondary | ICD-10-CM | POA: Diagnosis present

## 2018-04-22 DIAGNOSIS — E1122 Type 2 diabetes mellitus with diabetic chronic kidney disease: Secondary | ICD-10-CM | POA: Diagnosis present

## 2018-04-22 DIAGNOSIS — I5032 Chronic diastolic (congestive) heart failure: Secondary | ICD-10-CM | POA: Diagnosis present

## 2018-04-22 DIAGNOSIS — Z853 Personal history of malignant neoplasm of breast: Secondary | ICD-10-CM

## 2018-04-22 DIAGNOSIS — Z794 Long term (current) use of insulin: Secondary | ICD-10-CM

## 2018-04-22 DIAGNOSIS — Z887 Allergy status to serum and vaccine status: Secondary | ICD-10-CM

## 2018-04-22 DIAGNOSIS — Z9109 Other allergy status, other than to drugs and biological substances: Secondary | ICD-10-CM

## 2018-04-22 DIAGNOSIS — F419 Anxiety disorder, unspecified: Secondary | ICD-10-CM | POA: Diagnosis present

## 2018-04-22 DIAGNOSIS — Z6839 Body mass index (BMI) 39.0-39.9, adult: Secondary | ICD-10-CM

## 2018-04-22 DIAGNOSIS — I2510 Atherosclerotic heart disease of native coronary artery without angina pectoris: Secondary | ICD-10-CM | POA: Diagnosis present

## 2018-04-22 DIAGNOSIS — E7800 Pure hypercholesterolemia, unspecified: Secondary | ICD-10-CM | POA: Diagnosis present

## 2018-04-22 DIAGNOSIS — G9341 Metabolic encephalopathy: Secondary | ICD-10-CM | POA: Diagnosis present

## 2018-04-22 DIAGNOSIS — Z87891 Personal history of nicotine dependence: Secondary | ICD-10-CM

## 2018-04-22 DIAGNOSIS — Z801 Family history of malignant neoplasm of trachea, bronchus and lung: Secondary | ICD-10-CM

## 2018-04-22 DIAGNOSIS — E1165 Type 2 diabetes mellitus with hyperglycemia: Secondary | ICD-10-CM | POA: Diagnosis present

## 2018-04-22 LAB — CBC WITH DIFFERENTIAL/PLATELET
BASOS ABS: 0.1 10*3/uL (ref 0–0.1)
BASOS PCT: 1 %
Eosinophils Absolute: 0.6 10*3/uL (ref 0–0.7)
Eosinophils Relative: 10 %
HEMATOCRIT: 32.6 % — AB (ref 35.0–47.0)
HEMOGLOBIN: 10.8 g/dL — AB (ref 12.0–16.0)
Lymphocytes Relative: 17 %
Lymphs Abs: 1 10*3/uL (ref 1.0–3.6)
MCH: 28.1 pg (ref 26.0–34.0)
MCHC: 33.1 g/dL (ref 32.0–36.0)
MCV: 84.9 fL (ref 80.0–100.0)
Monocytes Absolute: 0.8 10*3/uL (ref 0.2–0.9)
Monocytes Relative: 13 %
NEUTROS PCT: 59 %
Neutro Abs: 3.7 10*3/uL (ref 1.4–6.5)
Platelets: 296 10*3/uL (ref 150–440)
RBC: 3.83 MIL/uL (ref 3.80–5.20)
RDW: 14.6 % — ABNORMAL HIGH (ref 11.5–14.5)
WBC: 6.2 10*3/uL (ref 3.6–11.0)

## 2018-04-22 LAB — TROPONIN I
TROPONIN I: 0.08 ng/mL — AB (ref <0.03)
TROPONIN I: 0.09 ng/mL — AB (ref <0.03)

## 2018-04-22 LAB — COMPREHENSIVE METABOLIC PANEL
ALT: 8 U/L (ref 0–44)
ANION GAP: 11 (ref 5–15)
AST: 17 U/L (ref 15–41)
Albumin: 3.4 g/dL — ABNORMAL LOW (ref 3.5–5.0)
Alkaline Phosphatase: 94 U/L (ref 38–126)
BUN: 20 mg/dL (ref 8–23)
CALCIUM: 9.1 mg/dL (ref 8.9–10.3)
CO2: 30 mmol/L (ref 22–32)
Chloride: 94 mmol/L — ABNORMAL LOW (ref 98–111)
Creatinine, Ser: 1.93 mg/dL — ABNORMAL HIGH (ref 0.44–1.00)
GFR calc non Af Amer: 24 mL/min — ABNORMAL LOW (ref >60)
GFR, EST AFRICAN AMERICAN: 28 mL/min — AB (ref >60)
Glucose, Bld: 355 mg/dL — ABNORMAL HIGH (ref 70–99)
Potassium: 3.9 mmol/L (ref 3.5–5.1)
SODIUM: 135 mmol/L (ref 135–145)
TOTAL PROTEIN: 6.9 g/dL (ref 6.5–8.1)
Total Bilirubin: 0.9 mg/dL (ref 0.3–1.2)

## 2018-04-22 LAB — URINALYSIS, COMPLETE (UACMP) WITH MICROSCOPIC
BILIRUBIN URINE: NEGATIVE
Glucose, UA: >=500 mg/dL — AB
KETONES UR: NEGATIVE mg/dL
LEUKOCYTES UA: NEGATIVE
NITRITE: NEGATIVE
Protein, ur: 100 mg/dL — AB
Specific Gravity, Urine: 1.005 (ref 1.005–1.030)
pH: 8 (ref 5.0–8.0)

## 2018-04-22 LAB — TSH: TSH: 1.026 u[IU]/mL (ref 0.350–4.500)

## 2018-04-22 LAB — GLUCOSE, CAPILLARY: Glucose-Capillary: 339 mg/dL — ABNORMAL HIGH (ref 70–99)

## 2018-04-22 LAB — BRAIN NATRIURETIC PEPTIDE: B Natriuretic Peptide: 335 pg/mL — ABNORMAL HIGH (ref 0.0–100.0)

## 2018-04-22 LAB — LACTIC ACID, PLASMA: Lactic Acid, Venous: 1 mmol/L (ref 0.5–1.9)

## 2018-04-22 MED ORDER — GUAIFENESIN ER 600 MG PO TB12
600.0000 mg | ORAL_TABLET | Freq: Two times a day (BID) | ORAL | Status: DC
  Administered 2018-04-26: 600 mg via ORAL
  Filled 2018-04-22 (×5): qty 1

## 2018-04-22 MED ORDER — CITALOPRAM HYDROBROMIDE 10 MG PO TABS
10.0000 mg | ORAL_TABLET | Freq: Every day | ORAL | Status: DC
  Administered 2018-04-22 – 2018-04-26 (×5): 10 mg via ORAL
  Filled 2018-04-22 (×5): qty 1

## 2018-04-22 MED ORDER — OLANZAPINE 10 MG PO TBDP
10.0000 mg | ORAL_TABLET | Freq: Every day | ORAL | Status: DC
  Administered 2018-04-22 – 2018-04-25 (×4): 10 mg via ORAL
  Filled 2018-04-22 (×5): qty 1

## 2018-04-22 MED ORDER — HYDROXYZINE HCL 25 MG PO TABS
25.0000 mg | ORAL_TABLET | Freq: Every day | ORAL | Status: DC
  Administered 2018-04-22: 25 mg via ORAL
  Filled 2018-04-22: qty 1

## 2018-04-22 MED ORDER — POLYSACCHARIDE IRON COMPLEX 150 MG PO CAPS
150.0000 mg | ORAL_CAPSULE | Freq: Three times a day (TID) | ORAL | Status: DC
  Administered 2018-04-22 – 2018-04-26 (×11): 150 mg via ORAL
  Filled 2018-04-22 (×13): qty 1

## 2018-04-22 MED ORDER — SENNA 8.6 MG PO TABS
1.0000 | ORAL_TABLET | Freq: Every day | ORAL | Status: DC
  Administered 2018-04-23 – 2018-04-26 (×3): 8.6 mg via ORAL
  Filled 2018-04-22 (×3): qty 1

## 2018-04-22 MED ORDER — BENZONATATE 100 MG PO CAPS: 100.0000 mg | ORAL_CAPSULE | Freq: Three times a day (TID) | ORAL | Status: DC | PRN

## 2018-04-22 MED ORDER — DOCUSATE SODIUM 100 MG PO CAPS
100.0000 mg | ORAL_CAPSULE | Freq: Two times a day (BID) | ORAL | Status: DC
Start: 2018-04-22 — End: 2018-04-22

## 2018-04-22 MED ORDER — NITROFURANTOIN MACROCRYSTAL 50 MG PO CAPS
100.0000 mg | ORAL_CAPSULE | Freq: Every day | ORAL | Status: DC
  Administered 2018-04-22 – 2018-04-25 (×4): 100 mg via ORAL
  Filled 2018-04-22 (×5): qty 2

## 2018-04-22 MED ORDER — LORATADINE 10 MG PO TABS
10.0000 mg | ORAL_TABLET | Freq: Every day | ORAL | Status: DC
  Administered 2018-04-23 – 2018-04-26 (×4): 10 mg via ORAL
  Filled 2018-04-22 (×4): qty 1

## 2018-04-22 MED ORDER — DILTIAZEM HCL 30 MG PO TABS: 30.0000 mg | ORAL_TABLET | Freq: Four times a day (QID) | ORAL | Status: DC

## 2018-04-22 MED ORDER — FUROSEMIDE 10 MG/ML IJ SOLN
20.0000 mg | Freq: Two times a day (BID) | INTRAMUSCULAR | Status: DC
  Administered 2018-04-22 – 2018-04-24 (×4): 20 mg via INTRAVENOUS
  Filled 2018-04-22 (×4): qty 2

## 2018-04-22 MED ORDER — DILTIAZEM HCL-DEXTROSE 100-5 MG/100ML-% IV SOLN (PREMIX)
5.0000 mg/h | INTRAVENOUS | Status: DC
  Administered 2018-04-22: 10 mg/h via INTRAVENOUS
  Administered 2018-04-22: 7.5 mg/h via INTRAVENOUS
  Administered 2018-04-22: 12.5 mg/h via INTRAVENOUS
  Administered 2018-04-22 – 2018-04-23 (×2): 5 mg/h via INTRAVENOUS
  Filled 2018-04-22 (×2): qty 100

## 2018-04-22 MED ORDER — METOPROLOL TARTRATE 5 MG/5ML IV SOLN
5.0000 mg | Freq: Once | INTRAVENOUS | Status: AC
  Administered 2018-04-22: 5 mg via INTRAVENOUS
  Filled 2018-04-22: qty 5

## 2018-04-22 MED ORDER — TRAZODONE HCL 50 MG PO TABS
25.0000 mg | ORAL_TABLET | Freq: Every evening | ORAL | Status: DC | PRN
  Administered 2018-04-22 – 2018-04-25 (×4): 25 mg via ORAL
  Filled 2018-04-22 (×4): qty 1

## 2018-04-22 MED ORDER — ACETAMINOPHEN 325 MG PO TABS
650.0000 mg | ORAL_TABLET | Freq: Four times a day (QID) | ORAL | Status: DC | PRN
  Administered 2018-04-22 – 2018-04-26 (×2): 650 mg via ORAL
  Filled 2018-04-22 (×2): qty 2

## 2018-04-22 MED ORDER — ONDANSETRON HCL 4 MG/2ML IJ SOLN
4.0000 mg | Freq: Four times a day (QID) | INTRAMUSCULAR | Status: DC | PRN
Start: 2018-04-22 — End: 2018-04-26

## 2018-04-22 MED ORDER — IPRATROPIUM-ALBUTEROL 0.5-2.5 (3) MG/3ML IN SOLN
3.0000 mL | Freq: Three times a day (TID) | RESPIRATORY_TRACT | Status: DC
  Administered 2018-04-23: 3 mL via RESPIRATORY_TRACT
  Filled 2018-04-22 (×4): qty 3

## 2018-04-22 MED ORDER — PANTOPRAZOLE SODIUM 40 MG PO TBEC
40.0000 mg | DELAYED_RELEASE_TABLET | Freq: Two times a day (BID) | ORAL | Status: DC
  Administered 2018-04-22 – 2018-04-26 (×8): 40 mg via ORAL
  Filled 2018-04-22 (×8): qty 1

## 2018-04-22 MED ORDER — POLYETHYLENE GLYCOL 3350 17 G PO PACK
17.0000 g | PACK | Freq: Every day | ORAL | Status: DC
Start: 2018-04-22 — End: 2018-04-26
  Administered 2018-04-23: 17 g via ORAL
  Filled 2018-04-22 (×3): qty 1

## 2018-04-22 MED ORDER — DILTIAZEM HCL 60 MG PO TABS
30.0000 mg | ORAL_TABLET | Freq: Once | ORAL | Status: AC
  Administered 2018-04-22: 30 mg via ORAL
  Filled 2018-04-22: qty 1

## 2018-04-22 MED ORDER — HEPARIN SODIUM (PORCINE) 5000 UNIT/ML IJ SOLN
5000.0000 [IU] | Freq: Three times a day (TID) | INTRAMUSCULAR | Status: DC
  Administered 2018-04-22 – 2018-04-26 (×11): 5000 [IU] via SUBCUTANEOUS
  Filled 2018-04-22 (×11): qty 1

## 2018-04-22 MED ORDER — ONDANSETRON HCL 4 MG PO TABS: 4.0000 mg | ORAL_TABLET | Freq: Four times a day (QID) | ORAL | Status: DC | PRN

## 2018-04-22 MED ORDER — VITAMIN E 45 MG (100 UNIT) PO CAPS
1000.0000 [IU] | ORAL_CAPSULE | Freq: Every day | ORAL | Status: DC
  Administered 2018-04-23 – 2018-04-26 (×4): 1000 [IU] via ORAL
  Filled 2018-04-22 (×4): qty 2

## 2018-04-22 MED ORDER — LORAZEPAM 0.5 MG PO TABS: 0.5000 mg | ORAL_TABLET | Freq: Two times a day (BID) | ORAL | Status: DC | PRN

## 2018-04-22 MED ORDER — LABETALOL HCL 5 MG/ML IV SOLN
5.0000 mg | Freq: Once | INTRAVENOUS | Status: AC
  Administered 2018-04-22: 5 mg via INTRAVENOUS
  Filled 2018-04-22: qty 4

## 2018-04-22 MED ORDER — ENSURE ENLIVE PO LIQD
237.0000 mL | Freq: Three times a day (TID) | ORAL | Status: DC
  Administered 2018-04-22 – 2018-04-24 (×4): 237 mL via ORAL

## 2018-04-22 MED ORDER — BISACODYL 5 MG PO TBEC: 5.0000 mg | DELAYED_RELEASE_TABLET | Freq: Every day | ORAL | Status: DC | PRN

## 2018-04-22 MED ORDER — ASPIRIN EC 81 MG PO TBEC
81.0000 mg | DELAYED_RELEASE_TABLET | Freq: Every day | ORAL | Status: DC
  Administered 2018-04-23 – 2018-04-26 (×4): 81 mg via ORAL
  Filled 2018-04-22 (×4): qty 1

## 2018-04-22 MED ORDER — DILTIAZEM HCL 25 MG/5ML IV SOLN
10.0000 mg | Freq: Once | INTRAVENOUS | Status: AC
  Administered 2018-04-22: 10 mg via INTRAVENOUS
  Filled 2018-04-22: qty 5

## 2018-04-22 MED ORDER — ATORVASTATIN CALCIUM 20 MG PO TABS
20.0000 mg | ORAL_TABLET | Freq: Every day | ORAL | Status: DC
  Filled 2018-04-22: qty 1

## 2018-04-22 MED ORDER — METOPROLOL TARTRATE 50 MG PO TABS
50.0000 mg | ORAL_TABLET | Freq: Two times a day (BID) | ORAL | Status: DC
  Administered 2018-04-22 – 2018-04-23 (×2): 50 mg via ORAL
  Filled 2018-04-22 (×2): qty 1

## 2018-04-22 MED ORDER — DOCUSATE SODIUM 100 MG PO CAPS
100.0000 mg | ORAL_CAPSULE | Freq: Two times a day (BID) | ORAL | Status: DC
  Administered 2018-04-22 – 2018-04-26 (×7): 100 mg via ORAL
  Filled 2018-04-22 (×8): qty 1

## 2018-04-22 MED ORDER — LEVETIRACETAM 250 MG PO TABS
250.0000 mg | ORAL_TABLET | Freq: Two times a day (BID) | ORAL | Status: DC
  Administered 2018-04-22 – 2018-04-26 (×8): 250 mg via ORAL
  Filled 2018-04-22 (×9): qty 1

## 2018-04-22 MED ORDER — LABETALOL HCL 5 MG/ML IV SOLN
10.0000 mg | Freq: Once | INTRAVENOUS | Status: AC
  Administered 2018-04-22: 10 mg via INTRAVENOUS
  Filled 2018-04-22: qty 4

## 2018-04-22 MED ORDER — ONDANSETRON 4 MG PO TBDP
4.0000 mg | ORAL_TABLET | Freq: Three times a day (TID) | ORAL | Status: DC | PRN
  Administered 2018-04-22: 4 mg via ORAL
  Filled 2018-04-22 (×2): qty 1

## 2018-04-22 MED ORDER — INSULIN GLARGINE 100 UNIT/ML ~~LOC~~ SOLN
43.0000 [IU] | Freq: Every day | SUBCUTANEOUS | Status: DC
  Administered 2018-04-22 – 2018-04-25 (×4): 43 [IU] via SUBCUTANEOUS
  Filled 2018-04-22 (×5): qty 0.43

## 2018-04-22 MED ORDER — ACETAMINOPHEN 650 MG RE SUPP: 650.0000 mg | Freq: Four times a day (QID) | RECTAL | Status: DC | PRN

## 2018-04-22 MED ORDER — DILTIAZEM HCL 60 MG PO TABS
ORAL_TABLET | ORAL | Status: AC
  Filled 2018-04-22: qty 1

## 2018-04-22 MED ORDER — INSULIN ASPART 100 UNIT/ML ~~LOC~~ SOLN
0.0000 [IU] | Freq: Three times a day (TID) | SUBCUTANEOUS | Status: DC
  Administered 2018-04-23: 5 [IU] via SUBCUTANEOUS
  Administered 2018-04-23: 2 [IU] via SUBCUTANEOUS
  Administered 2018-04-23: 9 [IU] via SUBCUTANEOUS
  Administered 2018-04-24 – 2018-04-25 (×4): 5 [IU] via SUBCUTANEOUS
  Administered 2018-04-25: 2 [IU] via SUBCUTANEOUS
  Administered 2018-04-26: 7 [IU] via SUBCUTANEOUS
  Administered 2018-04-26: 2 [IU] via SUBCUTANEOUS
  Filled 2018-04-22 (×10): qty 1

## 2018-04-22 MED ORDER — INSULIN ASPART 100 UNIT/ML ~~LOC~~ SOLN
0.0000 [IU] | Freq: Every day | SUBCUTANEOUS | Status: DC
  Administered 2018-04-22: 4 [IU] via SUBCUTANEOUS
  Administered 2018-04-23: 3 [IU] via SUBCUTANEOUS
  Administered 2018-04-24: 2 [IU] via SUBCUTANEOUS
  Administered 2018-04-25: 3 [IU] via SUBCUTANEOUS
  Filled 2018-04-22 (×4): qty 1

## 2018-04-22 MED ORDER — ADENOSINE 6 MG/2ML IV SOLN
6.0000 mg | Freq: Once | INTRAVENOUS | Status: AC
  Administered 2018-04-22: 6 mg via INTRAVENOUS
  Filled 2018-04-22: qty 2

## 2018-04-22 MED ORDER — LAMOTRIGINE 25 MG PO TABS
25.0000 mg | ORAL_TABLET | Freq: Every day | ORAL | Status: DC
  Administered 2018-04-22 – 2018-04-25 (×4): 25 mg via ORAL
  Filled 2018-04-22 (×5): qty 1

## 2018-04-22 NOTE — ED Notes (Signed)
6 mg Adenosine given in LAC, rapid push. Rhythm converted briefly to NSR then back into tachycardia. MD will place order for Cardizem.

## 2018-04-22 NOTE — Progress Notes (Signed)
Pt HR in the 80's with a reported troponin of 0.08. BP 158/70. MD Manuella Ghazi made aware. MD Humphrey Rolls made aware. MD states to continue to monitor and titrate drip according to parameters.

## 2018-04-22 NOTE — H&P (Addendum)
[facility] [place] NAME: Carolyn Valdez    MR#:  [id number]  DATE OF BIRTH:  06/27/1941  DATE OF ADMISSION:  04/22/2018  PRIMARY CARE PHYSICIAN: Leonel Ramsay, MD   REQUESTING/REFERRING PHYSICIAN: Arta Silence, MD  CHIEF COMPLAINT:   Chief Complaint  Patient presents with  . Headache    HISTORY OF PRESENT ILLNESS:  Carolyn Valdez  is a 77 y.o. female with multiple medical problems as below presents primarily with a complaint of syncope.  The patient states she was trying to get out of her recliner that she sleeps in this morning, when she passed out and came to on the ground.  She states that she does not think she hit her head and did not have any pain currently.  She reports generalized weakness over the last year, although it has been gradually getting worse.  She states that she has been intermittently compliant with her medications because she feels that they sometimes make her nauseous and throw up.  The patient's initial work-up did not reveal an obvious cause of her symptoms.  Her UA and chest x-ray showed no signs of infectious source. The patient was significantly hypertensive and so was given 2 doses of labetalol in ED with some improvement.  However, she then developed a narrow complex tachycardia to the 140s-150s. Was given 1 dose of IV metoprolol with no effect.  A dose of adenosine was also given and it did cause a brief slowing of the heart rate, but the tachycardia returned. So EDP pushed IV Cardizem which helped her return to normal sinus rhythm at a normal rate.  She is feeling fine now but considering cardiac arrhythmias and syncope she is being admitted for further eval and mgmt PAST MEDICAL HISTORY:   Past Medical History:  Diagnosis Date  . Anxiety   . Breast cancer ([place]) 1997   right breast mastectomy  . CHF (congestive heart failure) ([place])   . Diabetes mellitus without complication ([place])   . DJD (degenerative  joint disease)   . Dysuria   . H/O total knee replacement    right  . HTN (hypertension)   . Hypercholesteremia   . Kidney stone   . Obesity   . Recurrent urinary tract infection   . SVT (supraventricular tachycardia) ([place])   . Urinary incontinence   . Vaginal atrophy   . Yeast vaginitis     PAST SURGICAL HISTORY:   Past Surgical History:  Procedure Laterality Date  . CARDIAC CATHETERIZATION    . CHOLECYSTECTOMY    . FOOT SURGERY    . JOINT REPLACEMENT    . MASTECTOMY Right 1997  . NASAL SEPTUM SURGERY    . PERIPHERAL VASCULAR CATHETERIZATION N/A 07/08/2015   Procedure: PICC Line Insertion;  Surgeon: Algernon Huxley, MD;  Location: [facility];  Service: Cardiovascular;  Laterality: N/A;  . TONSILLECTOMY    . Vocal cords      SOCIAL HISTORY:   Social History   Tobacco Use  . Smoking status: Former Research scientist (life sciences)  . Smokeless tobacco: Former Systems developer    Quit date: 10/29/1995  Substance Use Topics  . Alcohol use: No    Alcohol/week: 0.0 oz    FAMILY HISTORY:   Family History  Problem Relation Age of Onset  . Lung cancer Father   . Hematuria Mother   . Lung cancer Mother   . Kidney disease Neg Hx   . Bladder Cancer Neg Hx   .  Breast cancer Neg Hx     DRUG ALLERGIES:   Allergies  Allergen Reactions  . Sulfa Antibiotics Hives  . Biaxin [Clarithromycin] Hives  . Influenza A (H1n1) Monoval Vac Other (See Comments)    Pt states that she was told by her MD not to get the influenza vaccine.    . Morphine Other (See Comments)    Reaction:  Dizziness and confusion   . Pyridium [Phenazopyridine Hcl] Other (See Comments)    Reaction:  Unknown   . Ceftriaxone Anxiety  . Latex Rash  . Prednisone Rash  . Tape Rash    REVIEW OF SYSTEMS:   Review of Systems  Constitutional: Negative for chills, fever and weight loss.  HENT: Negative for nosebleeds and sore throat.   Eyes: Negative for blurred vision.  Respiratory: Negative for cough, shortness of breath and  wheezing.   Cardiovascular: Negative for chest pain, orthopnea, leg swelling and PND.  Gastrointestinal: Negative for abdominal pain, constipation, diarrhea, heartburn, nausea and vomiting.  Genitourinary: Negative for dysuria and urgency.  Musculoskeletal: Negative for back pain.  Skin: Negative for rash.  Neurological: Positive for dizziness. Negative for speech change, focal weakness and headaches.  Endo/Heme/Allergies: Does not bruise/bleed easily.  Psychiatric/Behavioral: Negative for depression.    MEDICATIONS AT HOME:   Prior to Admission medications   Medication Sig Start Date End Date Taking? Authorizing Provider  acetaminophen (TYLENOL) 650 MG CR tablet Take 650 mg by mouth every 6 (six) hours as needed for pain.    [provider]  atorvastatin (LIPITOR) 20 MG tablet Take 20 mg by mouth daily.     [provider]  benzonatate (TESSALON) 100 MG capsule Take 100 mg by mouth 3 (three) times daily as needed for cough.    [provider]  ciprofloxacin (CIPRO) 250 MG tablet Take 1 tablet (250 mg total) by mouth 2 (two) times daily. Patient not taking: Reported on 04/22/2018 02/06/18   [name], MD  citalopram (CELEXA) 10 MG tablet Take 1 tablet (10 mg total) by mouth daily. 02/04/17   Loletha Grayer, MD  docusate sodium (COLACE) 100 MG capsule Take 100 mg by mouth 2 (two) times daily.     [provider]  feeding supplement, ENSURE ENLIVE, (ENSURE ENLIVE) LIQD Take 237 mLs by mouth 3 (three) times daily between meals. 10/26/16   Epifanio Lesches, MD  fluticasone (FLONASE) 50 MCG/ACT nasal spray Place 2 sprays into the nose as needed. 03/04/16   [provider]  furosemide (LASIX) 40 MG tablet Take 1 tablet (40 mg total) by mouth 2 (two) times daily. Patient taking differently: Take 40 mg by mouth daily.  02/04/17   Loletha Grayer, MD  guaiFENesin (MUCINEX) 600 MG 12 hr tablet Take 600 mg by mouth 2 (two) times daily.    [provider]  hydrOXYzine (ATARAX/VISTARIL) 25 MG tablet Take 1 tablet by mouth at bedtime.  07/05/16   [provider]  insulin glargine (LANTUS) 100 UNIT/ML injection Inject 0.43 mLs (43 Units total) into the skin at bedtime. 02/04/17   Loletha Grayer, MD  ipratropium-albuterol (DUONEB) 0.5-2.5 (3) MG/3ML SOLN Take 3 mLs by nebulization 3 (three) times daily. 02/04/17   Loletha Grayer, MD  iron polysaccharides (NIFEREX) 150 MG capsule Take 150 mg by mouth 3 (three) times daily.     [provider]  lamoTRIgine (LAMICTAL) 25 MG tablet Take 25 mg by mouth daily.    [provider]  levETIRAcetam (KEPPRA) 250 MG tablet Take 1  tablet (250 mg total) by mouth 2 (two) times daily. 10/26/16   Epifanio Lesches, MD  loratadine (CLARITIN) 10 MG tablet Take 10 mg by mouth daily.    [provider]  LORazepam (ATIVAN) 0.5 MG tablet Take 1 tablet (0.5 mg total) by mouth 2 (two) times daily as needed for anxiety. 02/24/17   Fritzi Mandes, MD  metoprolol (LOPRESSOR) 50 MG tablet Take 50 mg by mouth 2 (two) times daily.    [provider]  nitrofurantoin (MACRODANTIN) 100 MG capsule Take 1 capsule (100 mg total) by mouth daily. Patient not taking: Reported on 04/22/2018 02/06/18   [name], MD  OLANZapine zydis (ZYPREXA) 10 MG disintegrating tablet Take 1 tablet (10 mg total) by mouth at bedtime. 02/04/17   Loletha Grayer, MD  ondansetron (ZOFRAN ODT) 4 MG disintegrating tablet Take 1 tablet (4 mg total) by mouth every 8 (eight) hours as needed for nausea or vomiting. 09/11/16   Harvest Dark, MD  pantoprazole (PROTONIX) 40 MG tablet Take 40 mg by mouth 2 (two) times daily. 02/09/17   [provider]  polyethylene glycol (MIRALAX / GLYCOLAX) packet Take 17 g by mouth daily.    [provider]  senna (SENOKOT) 8.6 MG TABS tablet Take 1 tablet (8.6 mg total) by mouth daily. 09/30/16   Theodoro Grist, MD  traMADol (ULTRAM) 50 MG tablet Take 1  tablet (50 mg total) by mouth every 6 (six) hours as needed. 02/23/17   Hillary Bow, MD  vitamin E 1000 UNIT capsule Take 1,000 Units by mouth daily.    [provider]      VITAL SIGNS:  Blood pressure (!) 174/68, pulse (!) 149, temperature 99.9 F (37.7 C), temperature source Oral, resp. rate 20, height 5\' 8"  (1.727 m), weight 117.9 kg (260 lb), SpO2 95 %.  PHYSICAL EXAMINATION:  Physical Exam  GENERAL:  77 y.o.-year-old patient lying in the bed with no acute distress.  EYES: Pupils equal, round, reactive to light and accommodation. No scleral icterus. Extraocular muscles intact.  HEENT: Head atraumatic, normocephalic. Oropharynx and nasopharynx clear.  NECK:  Supple, no jugular venous distention. No thyroid enlargement, no tenderness.  LUNGS: Normal breath sounds bilaterally, no wheezing, rales,rhonchi or crepitation. No use of accessory muscles of respiration.  CARDIOVASCULAR: S1, S2 normal. No murmurs, rubs, or gallops.  ABDOMEN: Soft, nontender, nondistended. Bowel sounds present. No organomegaly or mass.  EXTREMITIES: 2 + pedal edema, No cyanosis, or clubbing.  NEUROLOGIC: Cranial nerves II through XII are intact. Muscle strength 5/5 in all extremities. Sensation intact. Gait not checked.  PSYCHIATRIC: The patient is alert and oriented x 3.  SKIN: Skin erythema in LLE + LABORATORY PANEL:   CBC Recent Labs  Lab 04/22/18 1135  WBC 6.2  HGB 10.8*  HCT 32.6*  PLT 296   ------------------------------------------------------------------------------------------------------------------  Chemistries  Recent Labs  Lab 04/22/18 1135  NA 135  K 3.9  CL 94*  CO2 30  GLUCOSE 355*  BUN 20  CREATININE 1.93*  CALCIUM 9.1  AST 17  ALT 8  ALKPHOS 94  BILITOT 0.9   ------------------------------------------------------------------------------------------------------------------  Cardiac Enzymes Recent Labs  Lab 04/22/18 1135  TROPONINI <0.03    ------------------------------------------------------------------------------------------------------------------  RADIOLOGY:  Dg Chest Portable 1 View  Result Date: 04/22/2018 CLINICAL DATA:  Weakness. EXAM: PORTABLE CHEST 1 VIEW COMPARISON:  Chest x-ray dated 02/21/2017. FINDINGS: Stable cardiomegaly. Aortic atherosclerosis. Lungs are clear. No pleural effusion or pneumothorax seen. Osseous structures about the chest are unremarkable. IMPRESSION: 1. No active disease.  No evidence of pneumonia or pulmonary edema. 2. Stable cardiomegaly. 3. Aortic atherosclerosis. Electronically Signed   By: Franki Cabot M.D.   On: 04/22/2018 12:29   IMPRESSION AND PLAN:  61 y f admitted for presyncope and concern for cardiac arrhythmias  * Presyncope/Syncope - monitor on tele - cardio c/s - serial troponins - orthostatic vitals, check TSH, HbA1c  * ARF - could be prerenal, hydrate and monitor   * Uncontrolled HTN - continue home meds, adding cardizem for rate control which can help BP too  * Cardiac Arrhythmia - A.fib vs SVT - Cardizem for now. Continue metoprolol  * Weakness - likely multifactorial, will get PT/OT c.s  * Mild LLE cellulitis - likely due to poor circulation and significant LE edema - will try doxycycline as she is sulfa allergic     All the records are reviewed and case discussed with ED provider. Management plans discussed with the patient, nursing and they are in agreement.  CODE STATUS: DNR  TOTAL TIME TAKING CARE OF THIS PATIENT: 45 minutes.    Max Sane M.D on 04/22/2018 at 3:16 PM  Between 7am to 6pm - Pager - [phone]  After 6pm go to [email or web address] - Proofreader  [facility]  Office  [phone]  CC: Primary care physician; Leonel Ramsay, MD   Note: This dictation was prepared with Dragon dictation along with smaller phrase technology. Any transcriptional errors that result from this process are  unintentional.

## 2018-04-22 NOTE — ED Triage Notes (Addendum)
Pt arrives via ems from home, pt lives independently in a town home, pt sleeps in a lift recliner and woke up this am with one of her legs draped over the arm of the chair, pt reports the last time she did that she had a UTI, pt also reports that she hasn't had her evening meds, night time meds, or am meds in the past 24 hours. Pt reports headache. fsbs per ems was 285. Pt states that she drives herself as needed, but doesn't do so often due to difficulty managing her walker.

## 2018-04-22 NOTE — ED Provider Notes (Signed)
[facility] Emergency Department Provider Note ____________________________________________   First MD Initiated Contact with Patient 04/22/18 1128     (approximate)  I have reviewed the triage vital signs and the nursing notes.   HISTORY  Chief Complaint Headache    HPI Carolyn Valdez is a 77 y.o. female with PMH as noted below who presents primarily with a complaint of syncope.  The patient states she was trying to get out of her recliner that she sleeps in this morning, when she passed out and came to on the ground.  She states that she does not think she hit her head and did not have any pain currently.  She states that the last time this happened she had a urinary tract infection.  She reports generalized weakness over the last year, although it has been gradually getting worse.  She states that she has been intermittently compliant with her medications because she feels that they sometimes make her nauseous and throw up.   Past Medical History:  Diagnosis Date  . Anxiety   . Breast cancer ([place]) 1997   right breast mastectomy  . CHF (congestive heart failure) ([place])   . Diabetes mellitus without complication ([place])   . DJD (degenerative joint disease)   . Dysuria   . H/O total knee replacement    right  . HTN (hypertension)   . Hypercholesteremia   . Kidney stone   . Obesity   . Recurrent urinary tract infection   . SVT (supraventricular tachycardia) ([place])   . Urinary incontinence   . Vaginal atrophy   . Yeast vaginitis     Patient Active Problem List   Diagnosis Date Noted  . Acute respiratory failure with hypoxia and hypercapnia ([place]) 02/21/2017  . Adjustment disorder with anxiety 01/28/2017  . Acute on chronic respiratory failure with hypoxia ([place]) 01/23/2017  . Palliative care encounter   . Goals of care, counseling/discussion   . DNR (do not resuscitate) 10/05/2016  . Palliative care by specialist 10/05/2016  . Depression, major,  recurrent, severe with psychosis ([place]) 10/05/2016  . Severe major depression, single episode, with psychotic features ([place]) 10/04/2016  . Cellulitis of leg, right 09/29/2016  . Proctitis 09/29/2016  . Hypercapnia 09/29/2016  . Acute urinary retention 09/29/2016  . UTI (urinary tract infection) 09/29/2016  . Weakness 09/29/2016  . Subacute delirium 09/28/2016  . Gastrointestinal hemorrhage   . Diffuse abdominal pain   . Altered mental status 09/24/2016  . Pressure injury of skin 09/02/2016  . Respiratory failure with hypoxia ([place]) 09/01/2016  . Lymphedema 08/16/2016  . Acute on chronic congestive heart failure ([place]) 08/09/2016  . Acquired lymphedema of leg 04/21/2016  . Congestive heart failure ([place]) 11/25/2015  . Nocturia 11/05/2015  . Urinary frequency 11/05/2015  . Acute respiratory failure with hypoxia ([place]) 09/05/2015  . 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 Incontinence 04/20/2015  . Diabetes mellitus, type 2 ([place]) 04/17/2015  . ESBL (extended spectrum beta-lactamase) producing bacteria infection 04/17/2015  . BP (high blood pressure) 04/17/2015  . Frequent UTI 04/17/2015  . Absence of bladder continence 04/17/2015  . Iron deficiency anemia 03/08/2015  . Absolute anemia 09/25/2014  . Abdominal pain, lower 09/25/2014  . Urge incontinence 02/19/2013  . FOM (frequency of micturition) 02/19/2013  . Bladder infection, chronic 08/11/2012  . Difficult or painful urination 07/26/2012  . Lower urinary tract infection 12/31/2011  . Diabetes mellitus ([place]) 12/31/2011    Past Surgical History:  Procedure Laterality Date  . CARDIAC CATHETERIZATION    .  CHOLECYSTECTOMY    . FOOT SURGERY    . JOINT REPLACEMENT    . MASTECTOMY Right 1997  . NASAL SEPTUM SURGERY    . PERIPHERAL VASCULAR CATHETERIZATION N/A 07/08/2015   Procedure: PICC Line Insertion;  Surgeon: Algernon Huxley, MD;  Location: [place] [facility];  Service: Cardiovascular;  Laterality: N/A;  . TONSILLECTOMY    . Vocal cords        Prior to Admission medications   Medication Sig Start Date End Date Taking? Authorizing Provider  acetaminophen (TYLENOL) 650 MG CR tablet Take 650 mg by mouth every 6 (six) hours as needed for pain.    [provider]  atorvastatin (LIPITOR) 20 MG tablet Take 20 mg by mouth daily.     [provider]  benzonatate (TESSALON) 100 MG capsule Take 100 mg by mouth 3 (three) times daily as needed for cough.    [provider]  ciprofloxacin (CIPRO) 250 MG tablet Take 1 tablet (250 mg total) by mouth 2 (two) times daily. Patient not taking: Reported on 04/22/2018 02/06/18   [name], MD  citalopram (CELEXA) 10 MG tablet Take 1 tablet (10 mg total) by mouth daily. 02/04/17   Loletha Grayer, MD  docusate sodium (COLACE) 100 MG capsule Take 100 mg by mouth 2 (two) times daily.     [provider]  feeding supplement, ENSURE ENLIVE, (ENSURE ENLIVE) LIQD Take 237 mLs by mouth 3 (three) times daily between meals. 10/26/16   Epifanio Lesches, MD  fluticasone (FLONASE) 50 MCG/ACT nasal spray Place 2 sprays into the nose as needed. 03/04/16   [provider]  furosemide (LASIX) 40 MG tablet Take 1 tablet (40 mg total) by mouth 2 (two) times daily. Patient taking differently: Take 40 mg by mouth daily.  02/04/17   Loletha Grayer, MD  guaiFENesin (MUCINEX) 600 MG 12 hr tablet Take 600 mg by mouth 2 (two) times daily.    [provider]  hydrOXYzine (ATARAX/VISTARIL) 25 MG tablet Take 1 tablet by mouth at bedtime.  07/05/16   [provider]  insulin glargine (LANTUS) 100 UNIT/ML injection Inject 0.43 mLs (43 Units total) into the skin at bedtime. 02/04/17   Loletha Grayer, MD  ipratropium-albuterol (DUONEB) 0.5-2.5 (3) MG/3ML SOLN Take 3 mLs by nebulization 3 (three) times daily. 02/04/17   Loletha Grayer, MD  iron polysaccharides (NIFEREX) 150 MG capsule Take 150 mg by mouth 3 (three) times daily.     [provider]   lamoTRIgine (LAMICTAL) 25 MG tablet Take 25 mg by mouth daily.    [provider]  levETIRAcetam (KEPPRA) 250 MG tablet Take 1 tablet (250 mg total) by mouth 2 (two) times daily. 10/26/16   Epifanio Lesches, MD  loratadine (CLARITIN) 10 MG tablet Take 10 mg by mouth daily.    [provider]  LORazepam (ATIVAN) 0.5 MG tablet Take 1 tablet (0.5 mg total) by mouth 2 (two) times daily as needed for anxiety. 02/24/17   Fritzi Mandes, MD  metoprolol (LOPRESSOR) 50 MG tablet Take 50 mg by mouth 2 (two) times daily.    [provider]  nitrofurantoin (MACRODANTIN) 100 MG capsule Take 1 capsule (100 mg total) by mouth daily. Patient not taking: Reported on 04/22/2018 02/06/18   [name], MD  OLANZapine zydis (ZYPREXA) 10 MG disintegrating tablet Take 1 tablet (10 mg total) by mouth at bedtime. 02/04/17   Loletha Grayer, MD  ondansetron (ZOFRAN ODT) 4 MG disintegrating tablet Take 1 tablet (4 mg total)  by mouth every 8 (eight) hours as needed for nausea or vomiting. 09/11/16   Harvest Dark, MD  pantoprazole (PROTONIX) 40 MG tablet Take 40 mg by mouth 2 (two) times daily. 02/09/17   [provider]  polyethylene glycol (MIRALAX / GLYCOLAX) packet Take 17 g by mouth daily.    [provider]  senna (SENOKOT) 8.6 MG TABS tablet Take 1 tablet (8.6 mg total) by mouth daily. 09/30/16   Theodoro Grist, MD  traMADol (ULTRAM) 50 MG tablet Take 1 tablet (50 mg total) by mouth every 6 (six) hours as needed. 02/23/17   Hillary Bow, MD  vitamin E 1000 UNIT capsule Take 1,000 Units by mouth daily.    [provider]    Allergies Sulfa antibiotics; Biaxin [clarithromycin]; Influenza a (h1n1) monoval vac; Morphine; Pyridium [phenazopyridine hcl]; Ceftriaxone; Latex; Prednisone; and Tape  Family History  Problem Relation Age of Onset  . Lung cancer Father   . Hematuria Mother   . Lung cancer Mother   . Kidney disease Neg Hx   . Bladder Cancer Neg Hx    . Breast cancer Neg Hx     Social History Social History   Tobacco Use  . Smoking status: Former Research scientist (life sciences)  . Smokeless tobacco: Former Systems developer    Quit date: 10/29/1995  Substance Use Topics  . Alcohol use: No    Alcohol/week: 0.0 oz  . Drug use: No    Review of Systems  Constitutional: No fever.  Positive for generalized weakness. Eyes: No redness. ENT: No sore throat. Cardiovascular: Denies chest pain. Respiratory: Denies shortness of breath. Gastrointestinal: Positive for nausea. Genitourinary: Negative for dysuria.  Musculoskeletal: Negative for back pain. Skin: Negative for rash. Neurological: Positive for mild headache.   ____________________________________________   PHYSICAL EXAM:  VITAL SIGNS: ED Triage Vitals  Enc Vitals Group     BP 04/22/18 1050 (!) 204/80     Pulse Rate 04/22/18 1050 88     Resp 04/22/18 1050 16     Temp 04/22/18 1050 99.3 F (37.4 C)     Temp Source 04/22/18 1050 Oral     SpO2 04/22/18 1050 96 %     Weight 04/22/18 1051 260 lb (117.9 kg)     Height 04/22/18 1051 5\' 8"  (1.727 m)     Head Circumference --      Peak Flow --      Pain Score 04/22/18 1051 4     Pain Loc --      Pain Edu? --      Excl. in [place]? --     Constitutional: Alert, oriented x4, but slightly slow to answer certain questions and appears mildly confused.  No acute distress. Eyes: Conjunctivae are normal.  EOMI. Head: Atraumatic. Nose: No congestion/rhinnorhea. Mouth/Throat: Mucous membranes are slightly dry.   Neck: Normal range of motion.  Cardiovascular: Normal rate, regular rhythm. Grossly normal heart sounds.  Good peripheral circulation. Respiratory: Normal respiratory effort.  No retractions.  Decreased breath sounds bilaterally. Gastrointestinal: Soft and nontender. No distention.  Genitourinary: No CVA tenderness. Musculoskeletal: 3+ bilateral lower lower extremity edema.  Extremities warm and well perfused.  Neurologic: Motor intact in all  extremities. Skin:  Skin is warm and dry. No rash noted. Psychiatric:Speech and behavior are normal.  ____________________________________________   LABS (all labs ordered are listed, but only abnormal results are displayed)  Labs Reviewed  COMPREHENSIVE METABOLIC PANEL - Abnormal; Notable for the following components:      Result Value   Chloride  94 (*)    Glucose, Bld 355 (*)    Creatinine, Ser 1.93 (*)    Albumin 3.4 (*)    GFR calc non Af Amer 24 (*)    GFR calc Af Amer 28 (*)    All other components within normal limits  CBC WITH DIFFERENTIAL/PLATELET - Abnormal; Notable for the following components:   Hemoglobin 10.8 (*)    HCT 32.6 (*)    RDW 14.6 (*)    All other components within normal limits  URINALYSIS, COMPLETE (UACMP) WITH MICROSCOPIC - Abnormal; Notable for the following components:   Color, Urine STRAW (*)    APPearance CLEAR (*)    Glucose, UA >=500 (*)    Hgb urine dipstick SMALL (*)    Protein, ur 100 (*)    Bacteria, UA RARE (*)    All other components within normal limits  BRAIN NATRIURETIC PEPTIDE - Abnormal; Notable for the following components:   B Natriuretic Peptide 335.0 (*)    All other components within normal limits  CULTURE, BLOOD (ROUTINE X 2)  CULTURE, BLOOD (ROUTINE X 2)  TROPONIN I  LACTIC ACID, PLASMA   ____________________________________________  EKG  ED ECG REPORT I, Arta Silence, the attending physician, personally viewed and interpreted this ECG.  Date: 04/22/2018 EKG Time: 1234 Rate: 91 Rhythm: normal sinus rhythm QRS Axis: normal Intervals: normal ST/T Wave abnormalities: Nonspecific ST abnormalities Narrative Interpretation: Nonspecific ST abnormality  ED ECG REPORT I, Arta Silence, the attending physician, personally viewed and interpreted this ECG.  Date: 04/22/2018 EKG Time: 1404 Rate: 147 Rhythm: Narrow complex tachycardia QRS Axis: normal Intervals: normal ST/T Wave abnormalities:  Repolarization abnormality Narrative Interpretation: SVT versus A. Fib  ED ECG REPORT I, Arta Silence, the attending physician, personally viewed and interpreted this ECG.  Date: 04/22/2018 EKG Time: 1454 Rate: 88 Rhythm: normal sinus rhythm QRS Axis: normal Intervals: normal ST/T Wave abnormalities: Diffuse ST depression Narrative Interpretation: Nonspecific ST abnormalities   ____________________________________________  RADIOLOGY  CXR: No focal infiltrate or acute edema  ____________________________________________   PROCEDURES  Procedure(s) performed: No  Procedures  Critical Care performed: Yes  CRITICAL CARE Performed by: Arta Silence   Total critical care time: 60 minutes  Critical care time was exclusive of separately billable procedures and treating other patients.  Critical care was necessary to treat or prevent imminent or life-threatening deterioration.  Critical care was time spent personally by me on the following activities: development of treatment plan with patient and/or surrogate as well as nursing, discussions with consultants, evaluation of patient's response to treatment, examination of patient, obtaining history from patient or surrogate, ordering and performing treatments and interventions, ordering and review of laboratory studies, ordering and review of radiographic studies, pulse oximetry and re-evaluation of patient's condition. ____________________________________________   INITIAL IMPRESSION / ASSESSMENT AND PLAN / ED COURSE  Pertinent labs & imaging results that were available during my care of the patient were reviewed by me and considered in my medical decision making (see chart for details).  77 year old female with PMH as noted above including CHF, diabetes, hypertension, and UTI presents after an apparent syncopal episode this morning, with worsening generalized weakness recently.  The patient was most recently  admitted about 1 year ago for acute respiratory failure related to her CHF.  On exam, the patient is alert and oriented but does seem very mildly confused and is slow to answer certain questions.  She has significant peripheral edema.  The remainder of the exam is as described above.  Differential is broad but includes primarily infectious causes, especially UTI, or pneumonia.  Also consider electrolyte abnormality other metabolic etiology, or less likely acute CHF.  Patient is hypertensive although I suspect that this is most likely due to poor medication compliance.  Plan: Sepsis work-up, basic and cardiac labs, IV labetalol due to her tachycardia and hypertension, and reassess.  ----------------------------------------- 3:09 PM on 04/22/2018 -----------------------------------------  The patient's initial work-up did not reveal an obvious cause of her symptoms.  Her UA and chest x-ray showed no signs of infectious source.  She was hyperglycemic and had slightly elevated creatinine from her baseline.  The patient was significantly hypertensive and so I gave 2 doses of labetalol with some improvement.  However, she then developed a narrow complex tachycardia to the 140s-150s.  Patient had a history of SVT.  We gave 1 dose of IV metoprolol with no effect.  A dose of adenosine was given and it did cause a brief slowing of the heart rate, but the tachycardia returned.  I then gave IV Cardizem with return to normal sinus rhythm at a normal rate.  It is unclear if this could be recurrent SVT which the patient has a history of, versus atrial fibrillation.  Given the syncope with cardiac dysrhythmia, I will admit the patient.  I signed the patient out to the hospitalist Dr. Anselm Jungling.  ____________________________________________   FINAL CLINICAL IMPRESSION(S) / ED DIAGNOSES  Final diagnoses:  Atrial fibrillation with rapid ventricular response ([place])  Syncope, unspecified syncope type      NEW  MEDICATIONS STARTED DURING THIS VISIT:  New Prescriptions   No medications on file     Note:  This document was prepared using Dragon voice recognition software and may include unintentional dictation errors.    Arta Silence, MD 04/22/18 1511

## 2018-04-22 NOTE — Progress Notes (Signed)
Pt blood sugar is 339. No sliding scale orders. MD Duane Boston made aware and sliding scale with insulin ordered. MD made aware that pt BP was 178/63 but pt pulse rate was 67 with drip at 10 ml/h. Per MD will titrate to 7.5 ml/h. Will continue to monitor

## 2018-04-22 NOTE — Progress Notes (Signed)
HR still in 140-150s per nursing. Will start cardizem drip and stop pills. Cardio Dr Humphrey Rolls is aware of c/s.

## 2018-04-23 ENCOUNTER — Inpatient Hospital Stay: Payer: Medicare Other

## 2018-04-23 DIAGNOSIS — N179 Acute kidney failure, unspecified: Secondary | ICD-10-CM | POA: Diagnosis present

## 2018-04-23 DIAGNOSIS — I5032 Chronic diastolic (congestive) heart failure: Secondary | ICD-10-CM | POA: Diagnosis present

## 2018-04-23 DIAGNOSIS — Z515 Encounter for palliative care: Secondary | ICD-10-CM | POA: Diagnosis not present

## 2018-04-23 DIAGNOSIS — E1122 Type 2 diabetes mellitus with diabetic chronic kidney disease: Secondary | ICD-10-CM | POA: Diagnosis present

## 2018-04-23 DIAGNOSIS — G9341 Metabolic encephalopathy: Secondary | ICD-10-CM | POA: Diagnosis present

## 2018-04-23 DIAGNOSIS — F419 Anxiety disorder, unspecified: Secondary | ICD-10-CM | POA: Diagnosis present

## 2018-04-23 DIAGNOSIS — R55 Syncope and collapse: Secondary | ICD-10-CM | POA: Diagnosis present

## 2018-04-23 DIAGNOSIS — E1165 Type 2 diabetes mellitus with hyperglycemia: Secondary | ICD-10-CM | POA: Diagnosis present

## 2018-04-23 DIAGNOSIS — L03116 Cellulitis of left lower limb: Secondary | ICD-10-CM | POA: Diagnosis present

## 2018-04-23 DIAGNOSIS — I248 Other forms of acute ischemic heart disease: Secondary | ICD-10-CM | POA: Diagnosis present

## 2018-04-23 DIAGNOSIS — Z66 Do not resuscitate: Secondary | ICD-10-CM | POA: Diagnosis present

## 2018-04-23 DIAGNOSIS — F329 Major depressive disorder, single episode, unspecified: Secondary | ICD-10-CM | POA: Diagnosis present

## 2018-04-23 DIAGNOSIS — Z7189 Other specified counseling: Secondary | ICD-10-CM | POA: Diagnosis not present

## 2018-04-23 DIAGNOSIS — Z6839 Body mass index (BMI) 39.0-39.9, adult: Secondary | ICD-10-CM | POA: Diagnosis not present

## 2018-04-23 DIAGNOSIS — I272 Pulmonary hypertension, unspecified: Secondary | ICD-10-CM | POA: Diagnosis present

## 2018-04-23 DIAGNOSIS — F202 Catatonic schizophrenia: Secondary | ICD-10-CM | POA: Diagnosis present

## 2018-04-23 DIAGNOSIS — I130 Hypertensive heart and chronic kidney disease with heart failure and stage 1 through stage 4 chronic kidney disease, or unspecified chronic kidney disease: Secondary | ICD-10-CM | POA: Diagnosis present

## 2018-04-23 DIAGNOSIS — N184 Chronic kidney disease, stage 4 (severe): Secondary | ICD-10-CM | POA: Diagnosis present

## 2018-04-23 DIAGNOSIS — I4891 Unspecified atrial fibrillation: Secondary | ICD-10-CM | POA: Diagnosis present

## 2018-04-23 DIAGNOSIS — E669 Obesity, unspecified: Secondary | ICD-10-CM | POA: Diagnosis present

## 2018-04-23 DIAGNOSIS — R4182 Altered mental status, unspecified: Secondary | ICD-10-CM | POA: Diagnosis not present

## 2018-04-23 DIAGNOSIS — E7800 Pure hypercholesterolemia, unspecified: Secondary | ICD-10-CM | POA: Diagnosis present

## 2018-04-23 DIAGNOSIS — Z96659 Presence of unspecified artificial knee joint: Secondary | ICD-10-CM | POA: Diagnosis present

## 2018-04-23 DIAGNOSIS — R569 Unspecified convulsions: Secondary | ICD-10-CM | POA: Diagnosis present

## 2018-04-23 DIAGNOSIS — F0390 Unspecified dementia without behavioral disturbance: Secondary | ICD-10-CM | POA: Diagnosis present

## 2018-04-23 DIAGNOSIS — I2510 Atherosclerotic heart disease of native coronary artery without angina pectoris: Secondary | ICD-10-CM | POA: Diagnosis present

## 2018-04-23 DIAGNOSIS — I471 Supraventricular tachycardia: Secondary | ICD-10-CM | POA: Diagnosis present

## 2018-04-23 LAB — BASIC METABOLIC PANEL
Anion gap: 6 (ref 5–15)
BUN: 18 mg/dL (ref 8–23)
CO2: 33 mmol/L — AB (ref 22–32)
Calcium: 8.7 mg/dL — ABNORMAL LOW (ref 8.9–10.3)
Chloride: 99 mmol/L (ref 98–111)
Creatinine, Ser: 1.91 mg/dL — ABNORMAL HIGH (ref 0.44–1.00)
GFR calc Af Amer: 28 mL/min — ABNORMAL LOW (ref >60)
GFR calc non Af Amer: 25 mL/min — ABNORMAL LOW (ref >60)
GLUCOSE: 183 mg/dL — AB (ref 70–99)
POTASSIUM: 3.8 mmol/L (ref 3.5–5.1)
Sodium: 138 mmol/L (ref 135–145)

## 2018-04-23 LAB — MRSA PCR SCREENING: MRSA by PCR: NEGATIVE

## 2018-04-23 LAB — GLUCOSE, CAPILLARY
GLUCOSE-CAPILLARY: 276 mg/dL — AB (ref 70–99)
Glucose-Capillary: 155 mg/dL — ABNORMAL HIGH (ref 70–99)
Glucose-Capillary: 308 mg/dL — ABNORMAL HIGH (ref 70–99)
Glucose-Capillary: 384 mg/dL — ABNORMAL HIGH (ref 70–99)
Glucose-Capillary: 288 mg/dL — ABNORMAL HIGH (ref 70–99)

## 2018-04-23 LAB — CBC
HCT: 28.6 % — ABNORMAL LOW (ref 35.0–47.0)
Hemoglobin: 9.4 g/dL — ABNORMAL LOW (ref 12.0–16.0)
MCH: 27.7 pg (ref 26.0–34.0)
MCHC: 32.9 g/dL (ref 32.0–36.0)
MCV: 84.3 fL (ref 80.0–100.0)
Platelets: 262 10*3/uL (ref 150–440)
RBC: 3.39 MIL/uL — ABNORMAL LOW (ref 3.80–5.20)
RDW: 14.2 % (ref 11.5–14.5)
WBC: 6 10*3/uL (ref 3.6–11.0)

## 2018-04-23 LAB — BLOOD GAS, ARTERIAL
ACID-BASE EXCESS: 7.4 mmol/L — AB (ref 0.0–2.0)
Bicarbonate: 33.4 mmol/L — ABNORMAL HIGH (ref 20.0–28.0)
FIO2: 0.28
O2 SAT: 97.7 %
PCO2 ART: 54 mmHg — AB (ref 32.0–48.0)
PO2 ART: 99 mmHg (ref 83.0–108.0)
Patient temperature: 37
pH, Arterial: 7.4 (ref 7.350–7.450)

## 2018-04-23 LAB — HEMOGLOBIN A1C
Hgb A1c MFr Bld: 7.6 % — ABNORMAL HIGH (ref 4.8–5.6)
Mean Plasma Glucose: 171.42 mg/dL

## 2018-04-23 LAB — TROPONIN I: Troponin I: 0.09 ng/mL (ref <0.03)

## 2018-04-23 MED ORDER — METOPROLOL TARTRATE 50 MG PO TABS: 50.0000 mg | ORAL_TABLET | Freq: Three times a day (TID) | ORAL | Status: DC

## 2018-04-23 MED ORDER — METOPROLOL TARTRATE 50 MG PO TABS
50.0000 mg | ORAL_TABLET | Freq: Two times a day (BID) | ORAL | Status: DC
  Administered 2018-04-23 – 2018-04-26 (×6): 50 mg via ORAL
  Filled 2018-04-23 (×6): qty 1

## 2018-04-23 MED ORDER — ATORVASTATIN CALCIUM 20 MG PO TABS: 20.0000 mg | ORAL_TABLET | Freq: Every day | ORAL | Status: DC

## 2018-04-23 MED ORDER — NALOXONE HCL 0.4 MG/ML IJ SOLN
0.4000 mg | INTRAMUSCULAR | Status: AC
  Administered 2018-04-23: 0.4 mg via INTRAVENOUS
  Filled 2018-04-23: qty 1

## 2018-04-23 NOTE — Evaluation (Signed)
Physical Therapy Evaluation Patient Details Name: Carolyn Valdez MRN: [id number] DOB: [birth year] Today's Date: 04/23/2018   History of Present Illness  77 yo Female came to ED after syncope episode at home. She was found to have hypertension and tachycardia. Patient was admitted to to telemetry; PMH significant: Anxiety, CHF, DM, past RTKA, HTN, HLD, Recurrent UTI;   Clinical Impression  Patient admitted due to syncope; PT assessed vitals for orthostatics. Patient does exhibit slight drop in BP upon standing but no significant change with supine to sitting. Patient lives alone in a condo with 1 step to enter with right rail. She does have intermittent help from her sister. Patient currently is mod A for bed mobility, min A for sit<>Stand with RW. She ambulated 15 feet in room with RW, min A with multiple standing rest breaks due to fatigue. Patient concerned about syncope and fearful of falling. Due to limited assistance at home recommend SNF rehab upon discharge. However while in acute care if she progresses well, DC recommendations should be updated. Patient would benefit from additional skilled PT intervention to improve strength, balance and gait safety;     Follow Up Recommendations SNF;Supervision/Assistance - 24 hour    Equipment Recommendations  None recommended by PT    Recommendations for Other Services       Precautions / Restrictions Precautions Precautions: Fall Restrictions Weight Bearing Restrictions: No      Mobility  Bed Mobility Overal bed mobility: Needs Assistance Bed Mobility: Supine to Sit     Supine to sit: Mod assist     General bed mobility comments: with level head of bed;   Transfers Overall transfer level: Needs assistance Equipment used: Rolling walker (2 wheeled) Transfers: Sit to/from Stand Sit to Stand: Min assist         General transfer comment: with mod Vcs to increase forward lean and improve hand placement for increased safety; x2  reps  Ambulation/Gait Ambulation/Gait assistance: Min assist Gait Distance (Feet): 15 Feet Assistive device: Rolling walker (2 wheeled) Gait Pattern/deviations: Decreased step length - right;Decreased step length - left;Step-through pattern;Wide base of support;Shuffle Gait velocity: decreased   General Gait Details: required 2-3 standing rest breaks; heavily fatigued; very slow gait speed;   Stairs            Wheelchair Mobility    Modified Rankin (Stroke Patients Only)       Balance Overall balance assessment: Needs assistance Sitting-balance support: Bilateral upper extremity supported;Feet supported Sitting balance-Leahy Scale: Poor Sitting balance - Comments: exhibits heavy posterior lean requiring min-mod A to keep erect posture;  Postural control: Posterior lean Standing balance support: Bilateral upper extremity supported Standing balance-Leahy Scale: Poor Standing balance comment: requires min A with gait                             Pertinent Vitals/Pain Pain Assessment: No/denies pain    Home Living Family/patient expects to be discharged to:: Private residence Living Arrangements: Alone Available Help at Discharge: Available PRN/intermittently;Family(sister visits often and is driver; has a personal care attendant 1x a week for housework; ) Type of Home: House Home Access: Stairs to enter Entrance Stairs-Rails: Right Entrance Stairs-Number of Steps: 1 Home Layout: One level Home Equipment: [place] - 2 wheels;Wheelchair - manual;Cane - single point;Shower seat;Grab bars - tub/shower      Prior Function Level of Independence: Independent with assistive device(s);Needs assistance   Gait / Transfers Assistance Needed: used RW for  most ambulation; slow;   ADL's / Homemaking Assistance Needed: had personal care attendant to assist with household chores weekly;         Hand Dominance        Extremity/Trunk Assessment   Upper Extremity  Assessment Upper Extremity Assessment: Overall WFL for tasks assessed    Lower Extremity Assessment Lower Extremity Assessment: RLE deficits/detail;LLE deficits/detail RLE Deficits / Details: grossly 3/5 RLE Sensation: WNL RLE Coordination: WNL LLE Deficits / Details: grossly 3/5 LLE Sensation: WNL LLE Coordination: WNL    Cervical / Trunk Assessment Cervical / Trunk Assessment: Normal  Communication   Communication: No difficulties  Cognition Arousal/Alertness: Awake/alert Behavior During Therapy: WFL for tasks assessed/performed Overall Cognitive Status: Within Functional Limits for tasks assessed                                        General Comments General comments (skin integrity, edema, etc.): exhibits increased edema in BLE    Exercises Other Exercises Other Exercises: Instructed patient in LE strengthening exercise in long sitting: ankle pumps, quad sets x5 reps each; patient required min Vcs for correct exercise technique; educated pt on importance of sitting in chair for better breathing and healing (x4 min total)   Assessment/Plan    PT Assessment Patient needs continued PT services  PT Problem List Decreased strength;Decreased activity tolerance;Decreased balance;Decreased mobility;Decreased safety awareness;Cardiopulmonary status limiting activity;Obesity       PT Treatment Interventions Gait training;Stair training;Functional mobility training;Neuromuscular re-education;Balance training;Therapeutic exercise;Therapeutic activities;Patient/family education    PT Goals (Current goals can be found in the Care Plan section)  Acute Rehab PT Goals Patient Stated Goal: to go home PT Goal Formulation: With patient Time For Goal Achievement: 05/07/18 Potential to Achieve Goals: Fair    Frequency Min 2X/week   Barriers to discharge Inaccessible home environment;Decreased caregiver support lives alone; has 1 step to enter house with rail;      Co-evaluation               AM-PAC PT "6 Clicks" Daily Activity  Outcome Measure Difficulty turning over in bed (including adjusting bedclothes, sheets and blankets)?: Unable Difficulty moving from lying on back to sitting on the side of the bed? : Unable Difficulty sitting down on and standing up from a chair with arms (e.g., wheelchair, bedside commode, etc,.)?: Unable Help needed moving to and from a bed to chair (including a wheelchair)?: A Little Help needed walking in hospital room?: A Little Help needed climbing 3-5 steps with a railing? : A Lot 6 Click Score: 11    End of Session Equipment Utilized During Treatment: Gait belt Activity Tolerance: Patient tolerated treatment well Patient left: in chair;with chair alarm set;with call bell/phone within reach Nurse Communication: Mobility status PT Visit Diagnosis: Unsteadiness on feet (R26.81);Muscle weakness (generalized) (M62.81)    Time: 3009-2330 PT Time Calculation (min) (ACUTE ONLY): 34 min   Charges:   PT Evaluation $PT Eval Moderate Complexity: 1 Mod     PT G Codes:          Chloey Ricard PT, DPT 04/23/2018, 3:25 PM

## 2018-04-23 NOTE — Care Management Note (Signed)
Case Management Note  Patient Details  Name: Carolyn Valdez MRN: [id number] Date of Birth: Jan 27, 1941  Subjective/Objective:  Patient admitted to [facility] under observation status for syncope. RNCM consulted on patient to provide MOON letter and complete assessment. Patient is familiar with to the [facility] team. In the past the patient has been discharged to assisted living facilities, short term rehab facilities and home with home health services with [facility]. Patient tells me she is currently in a town home alone but her sister Carolyn Valdez [phone] visits often. Patient currently has a personal care person who comes in once a week to help around the house. Sister Carolyn Valdez provides transport for her. PCP is [name]. [facility] delivers her medications regularly.                    Action/Plan:  RNCM to continue to follow for any needs. PT pending. Patient expresses desire for more in home assistance Expected Discharge Date:                  Expected Discharge Plan:     In-House Referral:     Discharge planning Services     Post Acute Care Choice:    Choice offered to:     DME Arranged:    DME Agency:     HH Arranged:    HH Agency:     Status of Service:     If discussed at [employer], dates discussed:    Additional Comments:  Latanya Maudlin, RN 04/23/2018, 8:50 AM

## 2018-04-23 NOTE — Progress Notes (Signed)
Upon pt return from CT she is alert and oriented she has no recollection of the prior events, asks why she is on oxygen now. She has eaten lunch and has no complaints at this time, ABG was unremarkable.

## 2018-04-23 NOTE — Progress Notes (Signed)
Was able to get patient to take a breathing treatment.  Spoke with her about bipap and benefits of using such. bipap on standby however. She does not use one at home and stated she really felt like treatments and bipap were not necessary because she does not use these things at home. She will have rn to call if she changes her mind however.

## 2018-04-23 NOTE — Care Management Obs Status (Signed)
[facility] NOTIFICATION   Patient Details  Name: ANAYA BOVEE MRN: [id number] Date of Birth: [birth year]   Medicare Observation Status Notification Given:  Yes    Augustine Leverette A Franklin [place], RN 04/23/2018, 8:50 AM

## 2018-04-23 NOTE — Progress Notes (Signed)
This RN to room to give pt afternoon insulin dose with PT, pt unable to participate in PT due to increasing drowsiness, and inability to carry on conversation with PT. MD paged, VS obtained, BP 141/42, Pulse 55, spO2 80% on room air-98% 2 L [place], CBG 308, MD ordered Head CT,  ABG, and Narcan. Narcan given w/ no improvement in symptoms. Pt back from head CT slightly more arousable.

## 2018-04-23 NOTE — Consult Note (Signed)
Carolyn Valdez is a 77 y.o. female  [id number]  Primary Cardiologist: Neoma Laming Reason for consult. This a 77 year old African-American female presented to the hospital with headache and dizziness and generalized weakness. Patient felt like he was going to pass out with fluttering in his chest. When he came into the emergency room her heart rate was 150 and was in SVT. She was given adenosine and she converted to sinus rhythm.     Review of Systems: as headache   Past Medical History:  Diagnosis Date  . Anxiety   . Breast cancer ([place]) 1997   right breast mastectomy  . CHF (congestive heart failure) ([place])   . Diabetes mellitus without complication ([place])   . DJD (degenerative joint disease)   . Dysuria   . H/O total knee replacement    right  . HTN (hypertension)   . Hypercholesteremia   . Kidney stone   . Obesity   . Recurrent urinary tract infection   . SVT (supraventricular tachycardia) ([place])   . Urinary incontinence   . Vaginal atrophy   . Yeast vaginitis     Medications Prior to Admission  Medication Sig Dispense Refill  . acetaminophen (TYLENOL) 650 MG CR tablet Take 650 mg by mouth every 6 (six) hours as needed for pain.    Marland Kitchen acidophilus (RISAQUAD) CAPS capsule Take 1 capsule by mouth at bedtime.    Marland Kitchen atorvastatin (LIPITOR) 20 MG tablet Take 20 mg by mouth at bedtime.     . citalopram (CELEXA) 10 MG tablet Take 1 tablet (10 mg total) by mouth daily. 30 tablet 0  . docusate sodium (COLACE) 100 MG capsule Take 100 mg by mouth 2 (two) times daily.     . furosemide (LASIX) 40 MG tablet Take 1 tablet (40 mg total) by mouth 2 (two) times daily. 60 tablet 0  . glipiZIDE (GLUCOTROL) 5 MG tablet Take 5 mg by mouth every evening.    . hydrOXYzine (ATARAX/VISTARIL) 25 MG tablet Take 1 tablet by mouth at bedtime.     . iron polysaccharides (NIFEREX) 150 MG capsule Take 150 mg by mouth 3 (three) times daily.     Marland Kitchen lamoTRIgine (LAMICTAL) 25 MG tablet Take 25 mg by mouth 2 (two)  times daily.     Marland Kitchen levETIRAcetam (KEPPRA) 250 MG tablet Take 1 tablet (250 mg total) by mouth 2 (two) times daily. 30 tablet 0  . loratadine (CLARITIN) 10 MG tablet Take 10 mg by mouth daily.    Marland Kitchen LORazepam (ATIVAN) 0.5 MG tablet Take 1 tablet (0.5 mg total) by mouth 2 (two) times daily as needed for anxiety. (Patient taking differently: Take 0.5 mg by mouth 3 (three) times daily as needed for anxiety. ) 10 tablet 0  . meclizine (ANTIVERT) 12.5 MG tablet Take 6.25 mg by mouth 3 (three) times daily as needed for dizziness.    . Melatonin 3 MG TABS Take 6 mg by mouth at bedtime.    . metFORMIN (GLUCOPHAGE) 500 MG tablet Take 1,000 mg by mouth 2 (two) times daily with a meal.    . metoprolol (LOPRESSOR) 50 MG tablet Take 50 mg by mouth 2 (two) times daily.    . nitrofurantoin (MACRODANTIN) 100 MG capsule Take 1 capsule (100 mg total) by mouth daily. 30 capsule 11  . pantoprazole (PROTONIX) 40 MG tablet Take 40 mg by mouth 2 (two) times daily.    Marland Kitchen senna (SENOKOT) 8.6 MG TABS tablet Take 1 tablet (8.6 mg total)  by mouth daily. 120 each 0  . tolterodine (DETROL LA) 4 MG 24 hr capsule Take 4 mg by mouth at bedtime.    Marland Kitchen trimethoprim (TRIMPEX) 100 MG tablet Take 50 mg by mouth at bedtime.    . ciprofloxacin (CIPRO) 250 MG tablet Take 1 tablet (250 mg total) by mouth 2 (two) times daily. (Patient not taking: Reported on 04/22/2018) 14 tablet 7  . feeding supplement, ENSURE ENLIVE, (ENSURE ENLIVE) LIQD Take 237 mLs by mouth 3 (three) times daily between meals. (Patient not taking: Reported on 04/22/2018) 237 mL 12  . insulin glargine (LANTUS) 100 UNIT/ML injection Inject 0.43 mLs (43 Units total) into the skin at bedtime. (Patient not taking: Reported on 04/22/2018) 15 mL 0  . ipratropium-albuterol (DUONEB) 0.5-2.5 (3) MG/3ML SOLN Take 3 mLs by nebulization 3 (three) times daily. (Patient not taking: Reported on 04/22/2018) 360 mL 0     . aspirin EC  81 mg Oral Daily  . citalopram  10 mg Oral Daily  . docusate  sodium  100 mg Oral BID  . feeding supplement (ENSURE ENLIVE)  237 mL Oral TID BM  . furosemide  20 mg Intravenous BID  . guaiFENesin  600 mg Oral BID  . heparin  5,000 Units Subcutaneous Q8H  . insulin aspart  0-5 Units Subcutaneous QHS  . insulin aspart  0-9 Units Subcutaneous TID WC  . insulin glargine  43 Units Subcutaneous QHS  . ipratropium-albuterol  3 mL Nebulization TID  . iron polysaccharides  150 mg Oral TID  . lamoTRIgine  25 mg Oral Daily  . levETIRAcetam  250 mg Oral BID  . loratadine  10 mg Oral Daily  . metoprolol tartrate  50 mg Oral TID  . naLOXone (NARCAN)  injection  0.4 mg Intravenous STAT  . nitrofurantoin  100 mg Oral Daily  . OLANZapine zydis  10 mg Oral QHS  . pantoprazole  40 mg Oral BID  . polyethylene glycol  17 g Oral Daily  . senna  1 tablet Oral Daily  . vitamin E  1,000 Units Oral Daily    Infusions:   Allergies  Allergen Reactions  . Sulfa Antibiotics Hives  . Biaxin [Clarithromycin] Hives  . Influenza A (H1n1) Monoval Vac Other (See Comments)    Pt states that she was told by her MD not to get the influenza vaccine.    . Morphine Other (See Comments)    Reaction:  Dizziness and confusion   . Pyridium [Phenazopyridine Hcl] Other (See Comments)    Reaction:  Unknown   . Ceftriaxone Anxiety  . Latex Rash  . Prednisone Rash  . Tape Rash    Social History   Socioeconomic History  . Marital status: Widowed    Spouse name: Not on file  . Number of children: Not on file  . Years of education: Not on file  . Highest education level: Not on file  Occupational History  . Not on file  Social Needs  . Financial resource strain: Not on file  . Food insecurity:    Worry: Not on file    Inability: Not on file  . Transportation needs:    Medical: Not on file    Non-medical: Not on file  Tobacco Use  . Smoking status: Former Research scientist (life sciences)  . Smokeless tobacco: Former Systems developer    Quit date: 10/29/1995  Substance and Sexual Activity  . Alcohol use:  No    Alcohol/week: 0.0 oz  . Drug use: No  .  Sexual activity: Never  Lifestyle  . Physical activity:    Days per week: Not on file    Minutes per session: Not on file  . Stress: Not on file  Relationships  . Social connections:    Talks on phone: Not on file    Gets together: Not on file    Attends religious service: Not on file    Active member of club or organization: Not on file    Attends meetings of clubs or organizations: Not on file    Relationship status: Not on file  . Intimate partner violence:    Fear of current or ex partner: Not on file    Emotionally abused: Not on file    Physically abused: Not on file    Forced sexual activity: Not on file  Other Topics Concern  . Not on file  Social History Narrative  . Not on file    Family History  Problem Relation Age of Onset  . Lung cancer Father   . Hematuria Mother   . Lung cancer Mother   . Kidney disease Neg Hx   . Bladder Cancer Neg Hx   . Breast cancer Neg Hx     PHYSICAL EXAM: Vitals:   04/23/18 0655 04/23/18 0905  BP: (!) 159/56 (!) 172/72  Pulse: (!) 56 78  Resp:  12  Temp:    SpO2:  93%     Intake/Output Summary (Last 24 hours) at 04/23/2018 1231 Last data filed at 04/23/2018 1008 Gross per 24 hour  Intake 443.5 ml  Output 2050 ml  Net -1606.5 ml    General:  Well appearing. No respiratory difficulty HEENT: normal Neck: supple. no JVD. Carotids 2+ bilat; no bruits. No lymphadenopathy or thryomegaly appreciated. Cor: PMI nondisplaced. Regular rate & rhythm. No rubs, gallops or murmurs. Lungs: clear Abdomen: soft, nontender, nondistended. No hepatosplenomegaly. No bruits or masses. Good bowel sounds. Extremities: no cyanosis, clubbing, rash, edema Neuro: alert & oriented x 3, cranial nerves grossly intact. moves all 4 extremities w/o difficulty. Affect pleasant.  ECG: sinus rhythm inferolateral ST depression suggestive of ischemia with earlier EKG showing SVTon admission  Results for  orders placed or performed during the hospital encounter of 04/22/18 (from the past 24 hour(s))  Urinalysis, Complete w Microscopic     Status: Abnormal   Collection Time: 04/22/18 12:34 PM  Result Value Ref Range   Color, Urine STRAW (A) YELLOW   APPearance CLEAR (A) CLEAR   Specific Gravity, Urine 1.005 1.005 - 1.030   pH 8.0 5.0 - 8.0   Glucose, UA >=500 (A) NEGATIVE mg/dL   Hgb urine dipstick SMALL (A) NEGATIVE   Bilirubin Urine NEGATIVE NEGATIVE   Ketones, ur NEGATIVE NEGATIVE mg/dL   Protein, ur 100 (A) NEGATIVE mg/dL   Nitrite NEGATIVE NEGATIVE   Leukocytes, UA NEGATIVE NEGATIVE   RBC / HPF 0-5 0 - 5 RBC/hpf   WBC, UA 0-5 0 - 5 WBC/hpf   Bacteria, UA RARE (A) NONE SEEN   Squamous Epithelial / LPF 0-5 0 - 5  TSH     Status: None   Collection Time: 04/22/18  6:39 PM  Result Value Ref Range   TSH 1.026 0.350 - 4.500 uIU/mL  Hemoglobin A1c     Status: Abnormal   Collection Time: 04/22/18  6:39 PM  Result Value Ref Range   Hgb A1c MFr Bld 7.6 (H) 4.8 - 5.6 %   Mean Plasma Glucose 171.42 mg/dL  Troponin I  Status: Abnormal   Collection Time: 04/22/18  6:39 PM  Result Value Ref Range   Troponin I 0.08 (HH) <0.03 ng/mL  MRSA PCR Screening     Status: None   Collection Time: 04/22/18  9:15 PM  Result Value Ref Range   MRSA by PCR NEGATIVE NEGATIVE  Glucose, capillary     Status: Abnormal   Collection Time: 04/22/18  9:25 PM  Result Value Ref Range   Glucose-Capillary 339 (H) 70 - 99 mg/dL  Troponin I     Status: Abnormal   Collection Time: 04/22/18 10:51 PM  Result Value Ref Range   Troponin I 0.09 (HH) <0.03 ng/mL  Basic metabolic panel     Status: Abnormal   Collection Time: 04/23/18  4:58 AM  Result Value Ref Range   Sodium 138 135 - 145 mmol/L   Potassium 3.8 3.5 - 5.1 mmol/L   Chloride 99 98 - 111 mmol/L   CO2 33 (H) 22 - 32 mmol/L   Glucose, Bld 183 (H) 70 - 99 mg/dL   BUN 18 8 - 23 mg/dL   Creatinine, Ser 1.91 (H) 0.44 - 1.00 mg/dL   Calcium 8.7 (L) 8.9 -  10.3 mg/dL   GFR calc non Af Amer 25 (L) >60 mL/min   GFR calc Af Amer 28 (L) >60 mL/min   Anion gap 6 5 - 15  CBC     Status: Abnormal   Collection Time: 04/23/18  4:58 AM  Result Value Ref Range   WBC 6.0 3.6 - 11.0 K/uL   RBC 3.39 (L) 3.80 - 5.20 MIL/uL   Hemoglobin 9.4 (L) 12.0 - 16.0 g/dL   HCT 28.6 (L) 35.0 - 47.0 %   MCV 84.3 80.0 - 100.0 fL   MCH 27.7 26.0 - 34.0 pg   MCHC 32.9 32.0 - 36.0 g/dL   RDW 14.2 11.5 - 14.5 %   Platelets 262 150 - 440 K/uL  Troponin I     Status: Abnormal   Collection Time: 04/23/18  4:58 AM  Result Value Ref Range   Troponin I 0.09 (HH) <0.03 ng/mL  Glucose, capillary     Status: Abnormal   Collection Time: 04/23/18  8:13 AM  Result Value Ref Range   Glucose-Capillary 155 (H) 70 - 99 mg/dL   Comment 1 Notify RN   Glucose, capillary     Status: Abnormal   Collection Time: 04/23/18 11:48 AM  Result Value Ref Range   Glucose-Capillary 276 (H) 70 - 99 mg/dL   Comment 1 Notify RN    US Carotid Bilateral  Result Date: 04/23/2018 CLINICAL DATA:  Syncopal episode. History of hypertension, hyperlipidemia and diabetes. Former smoker EXAM: BILATERAL CAROTID DUPLEX ULTRASOUND TECHNIQUE: Pearline Cables scale imaging, color Doppler and duplex ultrasound were performed of bilateral carotid and vertebral arteries in the neck. COMPARISON:  None. FINDINGS: Criteria: Quantification of carotid stenosis is based on velocity parameters that correlate the residual internal carotid diameter with NASCET-based stenosis levels, using the diameter of the distal internal carotid lumen as the denominator for stenosis measurement. The following velocity measurements were obtained: RIGHT ICA:  122/33 cm/sec CCA:  61/44 cm/sec SYSTOLIC ICA/CCA RATIO:  1.4 ECA:  107 cm/sec LEFT ICA:  78/18 cm/sec CCA:  31/5 cm/sec SYSTOLIC ICA/CCA RATIO:  1.0 ECA:  91 cm/sec RIGHT CAROTID ARTERY: There is a minimal amount of eccentric mixed echogenic plaque within the right carotid bulb (image 16), extending  to involve the origin and proximal aspects of the right internal carotid  artery (image 23), not resulting in elevated peak systolic velocities within the interrogated course of the right internal carotid artery to suggest a hemodynamically significant stenosis. RIGHT VERTEBRAL ARTERY:  Antegrade flow LEFT CAROTID ARTERY: There is a minimal amount of atherosclerotic plaque within the left carotid bulb (image 49), not resulting in elevated peak systolic velocities within the interrogated course of the left internal carotid artery to suggest a hemodynamically significant stenosis. LEFT VERTEBRAL ARTERY:  Antegrade flow IMPRESSION: Minimal amount of bilateral atherosclerotic plaque, right greater than left, not resulting in a hemodynamically significant stenosis within either internal carotid artery. Electronically Signed   By: Sandi Mariscal M.D.   On: 04/23/2018 08:15   Dg Chest Portable 1 View  Result Date: 04/22/2018 CLINICAL DATA:  Weakness. EXAM: PORTABLE CHEST 1 VIEW COMPARISON:  Chest x-ray dated 02/21/2017. FINDINGS: Stable cardiomegaly. Aortic atherosclerosis. Lungs are clear. No pleural effusion or pneumothorax seen. Osseous structures about the chest are unremarkable. IMPRESSION: 1. No active disease.  No evidence of pneumonia or pulmonary edema. 2. Stable cardiomegaly. 3. Aortic atherosclerosis. Electronically Signed   By: Franki Cabot M.D.   On: 04/22/2018 12:29     ASSESSMENT AND PLAN: supraventricular tachycardia with mildly elevated troponin and ischemic inferolateral ST depressions on EKG. Patient probably has coronary artery disease. Patient denies any chest pain but there is significant swelling of the lower extremities and appears to be very lethargic. Agree with getting ABG as well as CT of the head and may consider also getting Dopplers of the lower extremity to rule out DVT. Also we'll pulmonary embolism should be ruled out.  Oakley Orban A

## 2018-04-23 NOTE — Progress Notes (Signed)
Patient ID: Carolyn Valdez, female   DOB: [birth year], 77 y.o.   MRN: [id number]  [facility] PROGRESS NOTE  ALLEENE STOY WIO:[id number] DOB: [birth year] DOA: 04/22/2018 PCP: Leonel Ramsay, MD  HPI/Subjective: Patient seen earlier and she was confused kept on repeating herself.  Could not elaborate on why she came to the hospital.  She stated she passed out but could not tell me how she felt after that.  Could not tell me any details about it.  Spoke with the sister on the phone.  The patient lives alone and she is concerned about that.  At times she gets paranoid and claims that she stole her jewelry.  Nurse called me that she is having difficulty waking her up.  She is able to be aroused but falls back asleep.  Objective: Vitals:   04/23/18 1236 04/23/18 1238  BP: (!) 141/42   Pulse: (!) 55   Resp:    Temp: 97.8 F (36.6 C)   SpO2: (!) 80% 98%    Intake/Output Summary (Last 24 hours) at 04/23/2018 1255 Last data filed at 04/23/2018 1008 Gross per 24 hour  Intake 443.5 ml  Output 2050 ml  Net -1606.5 ml   Filed Weights   04/22/18 1051 04/22/18 1651 04/23/18 0341  Weight: 117.9 kg (260 lb) 111.5 kg (245 lb 13 oz) 111.8 kg (246 lb 6.4 oz)    ROS: Review of Systems  Constitutional: Negative for chills and fever.  Eyes: Negative for blurred vision.  Respiratory: Negative for cough and shortness of breath.   Cardiovascular: Negative for chest pain.  Gastrointestinal: Negative for abdominal pain, constipation, diarrhea, nausea and vomiting.  Genitourinary: Negative for dysuria.  Musculoskeletal: Negative for joint pain.  Neurological: Negative for dizziness and headaches.   Exam: Physical Exam  HENT:  Nose: No mucosal edema.  Mouth/Throat: No oropharyngeal exudate or posterior oropharyngeal edema.  Eyes: Pupils are equal, round, and reactive to light. Conjunctivae and lids are normal.  Neck: Neck supple. No JVD present. Carotid bruit is not present.  Cardiovascular: Regular  rhythm, S1 normal and S2 normal.  Murmur heard.  Systolic murmur is present with a grade of 2/6. Respiratory: She has decreased breath sounds in the right lower field and the left middle field. She has rales in the right lower field and the left lower field.  GI: Soft. Bowel sounds are normal. There is no tenderness.  Musculoskeletal:       Right ankle: She exhibits swelling.       Left ankle: She exhibits swelling.  Neurological:  When I saw her she was alert and answers some questions and moves all of her extremities.  Able to straight leg raise and move her arms.  Skin: Skin is warm. No rash noted. Nails show no clubbing.  Psychiatric:  Confused.      Data Reviewed: Basic Metabolic Panel: Recent Labs  Lab 04/22/18 1135 04/23/18 0458  NA 135 138  K 3.9 3.8  CL 94* 99  CO2 30 33*  GLUCOSE 355* 183*  BUN 20 18  CREATININE 1.93* 1.91*  CALCIUM 9.1 8.7*   Liver Function Tests: Recent Labs  Lab 04/22/18 1135  AST 17  ALT 8  ALKPHOS 94  BILITOT 0.9  PROT 6.9  ALBUMIN 3.4*   CBC: Recent Labs  Lab 04/22/18 1135 04/23/18 0458  WBC 6.2 6.0  NEUTROABS 3.7  --   HGB 10.8* 9.4*  HCT 32.6* 28.6*  MCV 84.9 84.3  PLT 296 262  Cardiac Enzymes: Recent Labs  Lab 04/22/18 1135 04/22/18 1839 04/22/18 2251 04/23/18 0458  TROPONINI <0.03 0.08* 0.09* 0.09*   BNP (last 3 results) Recent Labs    04/22/18 1135  BNP 335.0*     CBG: Recent Labs  Lab 04/22/18 2125 04/23/18 0813 04/23/18 1148 04/23/18 1236  GLUCAP 339* 155* 276* 308*    Recent Results (from the past 240 hour(s))  Blood culture (routine x 2)     Status: None (Preliminary result)   Collection Time: 04/22/18 11:35 AM  Result Value Ref Range Status   Specimen Description BLOOD BLOOD LEFT HAND  Final   Special Requests   Final    BOTTLES DRAWN AEROBIC AND ANAEROBIC Blood Culture adequate volume   Culture   Final    NO GROWTH < 24 HOURS Performed at [facility], [address] [place], [place] [address]    Report Status PENDING  Incomplete  Blood culture (routine x 2)     Status: None (Preliminary result)   Collection Time: 04/22/18 11:35 AM  Result Value Ref Range Status   Specimen Description BLOOD LEFT ANTECUBITAL  Final   Special Requests   Final    BOTTLES DRAWN AEROBIC AND ANAEROBIC Blood Culture adequate volume   Culture   Final    NO GROWTH < 24 HOURS Performed at [facility], [address] [place], [place] [address]    Report Status PENDING  Incomplete  MRSA PCR Screening     Status: None   Collection Time: 04/22/18  9:15 PM  Result Value Ref Range Status   MRSA by PCR NEGATIVE NEGATIVE Final    Comment:        The GeneXpert MRSA Assay (FDA approved for NASAL specimens only), is one component of a comprehensive MRSA colonization surveillance program. It is not intended to diagnose MRSA infection nor to guide or monitor treatment for MRSA infections. Performed at [facility], [address], [place], [place] [address]      Studies: US Carotid Bilateral  Result Date: 04/23/2018 CLINICAL DATA:  Syncopal episode. History of hypertension, hyperlipidemia and diabetes. Former smoker EXAM: BILATERAL CAROTID DUPLEX ULTRASOUND TECHNIQUE: Pearline Cables scale imaging, color Doppler and duplex ultrasound were performed of bilateral carotid and vertebral arteries in the neck. COMPARISON:  None. FINDINGS: Criteria: Quantification of carotid stenosis is based on velocity parameters that correlate the residual internal carotid diameter with NASCET-based stenosis levels, using the diameter of the distal internal carotid lumen as the denominator for stenosis measurement. The following velocity measurements were obtained: RIGHT ICA:  122/33 cm/sec CCA:  02/63 cm/sec SYSTOLIC ICA/CCA RATIO:  1.4 ECA:  107 cm/sec LEFT ICA:  78/18 cm/sec CCA:  78/5 cm/sec SYSTOLIC ICA/CCA RATIO:  1.0 ECA:  91 cm/sec RIGHT CAROTID ARTERY: There is a minimal amount of  eccentric mixed echogenic plaque within the right carotid bulb (image 16), extending to involve the origin and proximal aspects of the right internal carotid artery (image 23), not resulting in elevated peak systolic velocities within the interrogated course of the right internal carotid artery to suggest a hemodynamically significant stenosis. RIGHT VERTEBRAL ARTERY:  Antegrade flow LEFT CAROTID ARTERY: There is a minimal amount of atherosclerotic plaque within the left carotid bulb (image 49), not resulting in elevated peak systolic velocities within the interrogated course of the left internal carotid artery to suggest a hemodynamically significant stenosis. LEFT VERTEBRAL ARTERY:  Antegrade flow IMPRESSION: Minimal amount of bilateral atherosclerotic plaque, right greater than left, not resulting  in a hemodynamically significant stenosis within either internal carotid artery. Electronically Signed   By: Sandi Mariscal M.D.   On: 04/23/2018 08:15   Dg Chest Portable 1 View  Result Date: 04/22/2018 CLINICAL DATA:  Weakness. EXAM: PORTABLE CHEST 1 VIEW COMPARISON:  Chest x-ray dated 02/21/2017. FINDINGS: Stable cardiomegaly. Aortic atherosclerosis. Lungs are clear. No pleural effusion or pneumothorax seen. Osseous structures about the chest are unremarkable. IMPRESSION: 1. No active disease.  No evidence of pneumonia or pulmonary edema. 2. Stable cardiomegaly. 3. Aortic atherosclerosis. Electronically Signed   By: Franki Cabot M.D.   On: 04/22/2018 12:29    Scheduled Meds: . aspirin EC  81 mg Oral Daily  . citalopram  10 mg Oral Daily  . docusate sodium  100 mg Oral BID  . feeding supplement (ENSURE ENLIVE)  237 mL Oral TID BM  . furosemide  20 mg Intravenous BID  . guaiFENesin  600 mg Oral BID  . heparin  5,000 Units Subcutaneous Q8H  . insulin aspart  0-5 Units Subcutaneous QHS  . insulin aspart  0-9 Units Subcutaneous TID WC  . insulin glargine  43 Units Subcutaneous QHS  . ipratropium-albuterol   3 mL Nebulization TID  . iron polysaccharides  150 mg Oral TID  . lamoTRIgine  25 mg Oral Daily  . levETIRAcetam  250 mg Oral BID  . loratadine  10 mg Oral Daily  . metoprolol tartrate  50 mg Oral BID  . nitrofurantoin  100 mg Oral Daily  . OLANZapine zydis  10 mg Oral QHS  . pantoprazole  40 mg Oral BID  . polyethylene glycol  17 g Oral Daily  . senna  1 tablet Oral Daily  . vitamin E  1,000 Units Oral Daily   Continuous Infusions:  Assessment/Plan:  1. Atrial fibrillation with rapid ventricular response and SVT.  Patient now in normal sinus rhythm.  Continue metoprolol 50 mg twice daily.  Patient on aspirin for anticoagulation. 2. Acute encephalopathy.  Patient did have an episode like this in the past when I saw her.  I stopped Ativan, Lipitor and Vistaril.  Order ABG, CT scan of the head, Narcan.  Case discussed with neurology to follow-up.  2 admissions ago the patient required BiPAP. 3. Acute kidney injury watch with diuresis. 4. Chronic diastolic congestive heart failure and lower extremity edema.  On IV Lasix. 5. History of seizure on lamotrigine and Keppra 6. 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 7. Insulin-dependent diabetes mellitus on Lantus and sliding scale  Code Status:     Code Status Orders  (From admission, onward)        Start     Ordered   04/22/18 1702  Do not attempt resuscitation (DNR)  Continuous    Question Answer Comment  In the event of cardiac or respiratory ARREST Do not call a "code blue"   In the event of cardiac or respiratory ARREST Do not perform Intubation, CPR, defibrillation or ACLS   In the event of cardiac or respiratory ARREST Use medication by any route, position, wound care, and other measures to relive pain and suffering. May use oxygen, suction and manual treatment of airway obstruction as needed for comfort.      04/22/18 1701    Code Status History    Date Active Date Inactive Code Status Order  ID Comments User Context   02/21/2017 1629 02/24/2017 1747 DNR [id number]  Fritzi Mandes, MD Inpatient   01/31/2017 [phone] 02/04/2017  [place] DNR [id number]  Max Sane, MD Inpatient   01/23/2017 1320 01/31/2017 1617 Full Code [id number]  Henreitta Leber, MD Inpatient   10/05/2016 1216 10/26/2016 1740 DNR [id number]  Knox Royalty, NP Inpatient   09/24/2016 1731 10/05/2016 1216 Full Code [id number]  Gladstone Lighter, MD Inpatient   09/01/2016 0217 09/05/2016 1628 Full Code [id number]  Harvie Bridge, DO Inpatient   08/09/2016 2027 08/12/2016 2015 Full Code [id number]  Henreitta Leber, MD Inpatient   09/30/2015 1423 10/07/2015 1737 Full Code [id number]  Loletha Grayer, MD ED   09/05/2015 1756 09/09/2015 1738 DNR [id number]  Loletha Grayer, MD ED    Advance Directive Documentation     Most Recent Value  Type of Advance Directive  Living will, Out of facility DNR (pink MOST or yellow form)  Pre-existing out of facility DNR order (yellow form or pink MOST form)  Yellow form placed in chart (order not valid for inpatient use)  "MOST" Form in Place?  -     Family Communication: Spoke with sister on the phone Disposition Plan: To be determined  Consultants:  Cardiology  Neurology  Time spent: 32  minutes in coordination of care.  Johne Buckle [employer]

## 2018-04-23 NOTE — Progress Notes (Signed)
Cardizem drip stopped due to pt HR falling below parameters to 59. MD made aware. Pt resting in bed with eyes closed. VSS. No concerns offered at this time.

## 2018-04-23 NOTE — Progress Notes (Signed)
   04/23/18 1231  PT Visit Information  Last PT Received On 04/23/18  Reason Eval/Treat Not Completed Patient's level of consciousness (pt not responsive; RN aware and contacting MD regarding change in status. will re-attempt PT eval when patient able to follow commands and cooperative; )

## 2018-04-24 ENCOUNTER — Inpatient Hospital Stay
Admit: 2018-04-24 | Discharge: 2018-04-24 | Disposition: A | Discharge status: Home / Self Care | Payer: Medicare Other | Attending: Cardiovascular Disease | Admitting: Cardiovascular Disease

## 2018-04-24 ENCOUNTER — Inpatient Hospital Stay: Payer: Medicare Other

## 2018-04-24 DIAGNOSIS — R4182 Altered mental status, unspecified: Secondary | ICD-10-CM

## 2018-04-24 LAB — GLUCOSE, CAPILLARY
GLUCOSE-CAPILLARY: 261 mg/dL — AB (ref 70–99)
GLUCOSE-CAPILLARY: 243 mg/dL — AB (ref 70–99)
Glucose-Capillary: 91 mg/dL (ref 70–99)
Glucose-Capillary: 273 mg/dL — ABNORMAL HIGH (ref 70–99)

## 2018-04-24 LAB — BASIC METABOLIC PANEL
ANION GAP: 7 (ref 5–15)
BUN: 22 mg/dL (ref 8–23)
CALCIUM: 8.9 mg/dL (ref 8.9–10.3)
CO2: 34 mmol/L — AB (ref 22–32)
Chloride: 100 mmol/L (ref 98–111)
Creatinine, Ser: 1.99 mg/dL — ABNORMAL HIGH (ref 0.44–1.00)
GFR, EST AFRICAN AMERICAN: 27 mL/min — AB (ref >60)
GFR, EST NON AFRICAN AMERICAN: 23 mL/min — AB (ref >60)
Glucose, Bld: 131 mg/dL — ABNORMAL HIGH (ref 70–99)
POTASSIUM: 3.5 mmol/L (ref 3.5–5.1)
Sodium: 141 mmol/L (ref 135–145)

## 2018-04-24 MED ORDER — POTASSIUM CHLORIDE CRYS ER 20 MEQ PO TBCR
20.0000 meq | EXTENDED_RELEASE_TABLET | Freq: Two times a day (BID) | ORAL | Status: DC
  Administered 2018-04-24 – 2018-04-26 (×5): 20 meq via ORAL
  Filled 2018-04-24 (×5): qty 1

## 2018-04-24 MED ORDER — HYDRALAZINE HCL 25 MG PO TABS
25.0000 mg | ORAL_TABLET | Freq: Three times a day (TID) | ORAL | Status: DC
  Administered 2018-04-24 – 2018-04-25 (×3): 25 mg via ORAL
  Filled 2018-04-24 (×3): qty 1

## 2018-04-24 MED ORDER — GLUCERNA SHAKE PO LIQD
237.0000 mL | Freq: Two times a day (BID) | ORAL | Status: DC
  Administered 2018-04-24 – 2018-04-25 (×3): 237 mL via ORAL

## 2018-04-24 MED ORDER — IPRATROPIUM-ALBUTEROL 0.5-2.5 (3) MG/3ML IN SOLN: 3.0000 mL | RESPIRATORY_TRACT | Status: DC | PRN

## 2018-04-24 MED ORDER — INSULIN ASPART 100 UNIT/ML ~~LOC~~ SOLN
4.0000 [IU] | Freq: Three times a day (TID) | SUBCUTANEOUS | Status: DC
  Administered 2018-04-24 – 2018-04-26 (×7): 4 [IU] via SUBCUTANEOUS
  Filled 2018-04-24 (×7): qty 1

## 2018-04-24 MED ORDER — SODIUM CHLORIDE 0.9% FLUSH
3.0000 mL | Freq: Two times a day (BID) | INTRAVENOUS | Status: DC
  Administered 2018-04-24 – 2018-04-26 (×4): 3 mL via INTRAVENOUS

## 2018-04-24 MED ORDER — FUROSEMIDE 10 MG/ML IJ SOLN
20.0000 mg | Freq: Once | INTRAMUSCULAR | Status: AC
  Administered 2018-04-24: 20 mg via INTRAVENOUS
  Filled 2018-04-24: qty 2

## 2018-04-24 MED ORDER — FUROSEMIDE 10 MG/ML IJ SOLN
40.0000 mg | Freq: Two times a day (BID) | INTRAMUSCULAR | Status: DC
  Administered 2018-04-24 – 2018-04-25 (×2): 40 mg via INTRAVENOUS
  Filled 2018-04-24 (×2): qty 4

## 2018-04-24 NOTE — NC FL2 (Signed)
[place] LEVEL OF CARE SCREENING TOOL     IDENTIFICATION  Patient Name: Carolyn Valdez: Mar 06, 1941 Sex: female Admission Date (Current Location): 04/22/2018  [place] and [place] Number:  Engineering geologist and Address:  [facility], [address] [place], [place] [address]      Provider Number: [id number]  Attending Physician Name and Address:  Loletha Grayer, MD  Relative Name and Phone Number:  Alain Marion Sister [phone]  [phone]     Current Level of Care: Hospital Recommended Level of Care: [place] Prior Approval Number:    Date Approved/Denied:   PASRR Number: [id number]  Discharge Plan: SNF    Current Diagnoses: Patient Active Problem List   Diagnosis Date Noted  . Atrial fibrillation with RVR ([place]) 04/23/2018  . Syncope 04/22/2018  . Acute respiratory failure with hypoxia and hypercapnia ([place]) 02/21/2017  . Adjustment disorder with anxiety 01/28/2017  . Acute on chronic respiratory failure with hypoxia ([place]) 01/23/2017  . Palliative care encounter   . Goals of care, counseling/discussion   . DNR (do not resuscitate) 10/05/2016  . Palliative care by specialist 10/05/2016  . Depression, major, recurrent, severe with psychosis ([place]) 10/05/2016  . Severe major depression, single episode, with psychotic features ([place]) 10/04/2016  . Cellulitis of leg, right 09/29/2016  . Proctitis 09/29/2016  . Hypercapnia 09/29/2016  . Acute urinary retention 09/29/2016  . UTI (urinary tract infection) 09/29/2016  . Weakness 09/29/2016  . Subacute delirium 09/28/2016  . Gastrointestinal hemorrhage   . Diffuse abdominal pain   . Altered mental status 09/24/2016  . Pressure injury of skin 09/02/2016  . Respiratory failure with hypoxia ([place]) 09/01/2016  . Lymphedema 08/16/2016  . Acute on chronic congestive heart failure ([place]) 08/09/2016  . Acquired lymphedema of leg 04/21/2016  . Congestive  heart failure ([place]) 11/25/2015  . Nocturia 11/05/2015  . Urinary frequency 11/05/2015  . Acute respiratory failure with hypoxia ([place]) 09/05/2015  . 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 Incontinence 04/20/2015  . Diabetes mellitus, type 2 ([place]) 04/17/2015  . ESBL (extended spectrum beta-lactamase) producing bacteria infection 04/17/2015  . BP (high blood pressure) 04/17/2015  . Frequent UTI 04/17/2015  . Absence of bladder continence 04/17/2015  . Iron deficiency anemia 03/08/2015  . Absolute anemia 09/25/2014  . Abdominal pain, lower 09/25/2014  . Urge incontinence 02/19/2013  . FOM (frequency of micturition) 02/19/2013  . Bladder infection, chronic 08/11/2012  . Difficult or painful urination 07/26/2012  . Lower urinary tract infection 12/31/2011  . Diabetes mellitus ([place]) 12/31/2011    Orientation RESPIRATION BLADDER Height & Weight     Self, Time, Situation, Place  Normal Incontinent Weight: 242 lb 9.6 oz (110 kg) Height:  5\' 6"  (167.6 cm)  BEHAVIORAL SYMPTOMS/MOOD NEUROLOGICAL BOWEL NUTRITION STATUS      Continent Diet(Carb Modified)  AMBULATORY STATUS COMMUNICATION OF NEEDS Skin   Limited Assist Verbally Normal                       Personal Care Assistance Level of Assistance  Bathing, Feeding, Dressing Bathing Assistance: Limited assistance Feeding assistance: Independent Dressing Assistance: Limited assistance     Functional Limitations Info  Sight, Hearing, Speech Sight Info: Adequate Hearing Info: Adequate Speech Info: Adequate    SPECIAL CARE FACTORS FREQUENCY  PT (By licensed PT), OT (By licensed OT)     PT Frequency: 5x a week OT Frequency: 5x a week  Contractures      Additional Factors Info  Code Status, Allergies, Psychotropic, Insulin Sliding Scale(DNR) Code Status Info: DNR Allergies Info: SULFA ANTIBIOTICS, BIAXIN CLARITHROMYCIN, INFLUENZA A (H1N1) MONOVAL VAC, MORPHINE, PYRIDIUM PHENAZOPYRIDINE HCL, CEFTRIAXONE, LATEX,  PREDNISONE, TAPE  Psychotropic Info: citalopram (CELEXA) tablet 10 mg  Insulin Sliding Scale Info: insulin aspart (novoLOG) injection 0-9 Units 3x a day with meals       Current Medications (04/24/2018):  This is the current hospital active medication list Current Facility-Administered Medications  Medication Dose Route Frequency Provider Last Rate Last Dose  . acetaminophen (TYLENOL) tablet 650 mg  650 mg Oral Q6H PRN Max Sane, MD   650 mg at 04/22/18 2146   Or  . acetaminophen (TYLENOL) suppository 650 mg  650 mg Rectal Q6H PRN Max Sane, MD      . aspirin EC tablet 81 mg  81 mg Oral Daily Max Sane, MD   81 mg at 04/24/18 0847  . benzonatate (TESSALON) capsule 100 mg  100 mg Oral TID PRN Max Sane, MD      . bisacodyl (DULCOLAX) EC tablet 5 mg  5 mg Oral Daily PRN Max Sane, MD      . citalopram (CELEXA) tablet 10 mg  10 mg Oral Daily Max Sane, MD   10 mg at 04/24/18 0846  . docusate sodium (COLACE) capsule 100 mg  100 mg Oral BID Max Sane, MD   100 mg at 04/23/18 2233  . feeding supplement (GLUCERNA SHAKE) (GLUCERNA SHAKE) liquid 237 mL  237 mL Oral BID BM Wieting, Richard, MD   237 mL at 04/24/18 1531  . furosemide (LASIX) injection 40 mg  40 mg Intravenous BID Wieting, Richard, MD      . guaiFENesin [facility]) 12 hr tablet 600 mg  600 mg Oral BID Max Sane, MD      . heparin injection 5,000 Units  5,000 Units Subcutaneous Q8H Max Sane, MD   5,000 Units at 04/24/18 1531  . hydrALAZINE (APRESOLINE) tablet 25 mg  25 mg Oral Q8H Neoma Laming A, MD   25 mg at 04/24/18 1531  . insulin aspart (novoLOG) injection 0-5 Units  0-5 Units Subcutaneous QHS Amelia Jo, MD   3 Units at 04/23/18 2233  . insulin aspart (novoLOG) injection 0-9 Units  0-9 Units Subcutaneous TID WC Amelia Jo, MD   5 Units at 04/24/18 1254  . insulin aspart (novoLOG) injection 4 Units  4 Units Subcutaneous TID WC Loletha Grayer, MD   4 Units at 04/24/18 1254  . insulin glargine (LANTUS)  injection 43 Units  43 Units Subcutaneous QHS Max Sane, MD   43 Units at 04/23/18 2232  . ipratropium-albuterol (DUONEB) 0.5-2.5 (3) MG/3ML nebulizer solution 3 mL  3 mL Nebulization Q4H PRN Wieting, Richard, MD      . iron polysaccharides (NIFEREX) capsule 150 mg  150 mg Oral TID Max Sane, MD   150 mg at 04/24/18 1531  . lamoTRIgine (LAMICTAL) tablet 25 mg  25 mg Oral Daily Max Sane, MD   25 mg at 04/23/18 1709  . levETIRAcetam (KEPPRA) tablet 250 mg  250 mg Oral BID Max Sane, MD   250 mg at 04/24/18 0847  . loratadine (CLARITIN) tablet 10 mg  10 mg Oral Daily Max Sane, MD   10 mg at 04/24/18 0848  . metoprolol tartrate (LOPRESSOR) tablet 50 mg  50 mg Oral BID Loletha Grayer, MD   50 mg at 04/24/18 0847  . nitrofurantoin (MACRODANTIN) capsule 100 mg  100 mg Oral Daily Max Sane, MD   100 mg at 04/23/18 1703  . OLANZapine zydis (ZYPREXA) disintegrating tablet 10 mg  10 mg Oral QHS Max Sane, MD   10 mg at 04/23/18 2233  . ondansetron (ZOFRAN) tablet 4 mg  4 mg Oral Q6H PRN Max Sane, MD       Or  . ondansetron (ZOFRAN) injection 4 mg  4 mg Intravenous Q6H PRN Max Sane, MD      . ondansetron (ZOFRAN-ODT) disintegrating tablet 4 mg  4 mg Oral Q8H PRN Max Sane, MD   4 mg at 04/22/18 1901  . pantoprazole (PROTONIX) EC tablet 40 mg  40 mg Oral BID Max Sane, MD   40 mg at 04/24/18 0848  . polyethylene glycol (MIRALAX / GLYCOLAX) packet 17 g  17 g Oral Daily Max Sane, MD   17 g at 04/23/18 0942  . potassium chloride SA (K-DUR,KLOR-CON) CR tablet 20 mEq  20 mEq Oral BID Loletha Grayer, MD   20 mEq at 04/24/18 0940  . senna (SENOKOT) tablet 8.6 mg  1 tablet Oral Daily Max Sane, MD   8.6 mg at 04/23/18 0942  . traZODone (DESYREL) tablet 25 mg  25 mg Oral QHS PRN Max Sane, MD   25 mg at 04/23/18 2232  . vitamin E capsule 1,000 Units  1,000 Units Oral Daily Max Sane, MD   1,000 Units at 04/24/18 0846   Facility-Administered Medications Ordered in Other Encounters   Medication Dose Route Frequency Provider Last Rate Last Dose  . 0.9 %  sodium chloride infusion    Continuous PRN Dionne Bucy, CRNA         Discharge Medications: Please see discharge summary for a list of discharge medications.  Relevant Imaging Results:  Relevant Lab Results:   Additional Information ss: [id number]  Ross Ludwig, LCSWA

## 2018-04-24 NOTE — Progress Notes (Signed)
Initial Nutrition Assessment  DOCUMENTATION CODES:   Obesity unspecified  INTERVENTION:   - Glucerna Shake po BID, each supplement provides 220 kcal and 10 grams of protein  NUTRITION DIAGNOSIS:   Inadequate oral intake related to poor appetite as evidenced by per patient/family report.  GOAL:   Patient will meet greater than or equal to 90% of their needs  MONITOR:   PO intake, Supplement acceptance, Weight trends, I & O's, Labs, Skin  REASON FOR ASSESSMENT:   Malnutrition Screening Tool    ASSESSMENT:   77 year old female who presented to the ED with headache and syncope. PMH significant for breast cancer s/p R mastectomy, CHF, type 2 diabetes mellitus, and hypertension.  Spoke with pt at bedside who was somewhat confused and a poor historian. Pt states she has had a poor appetite and usually eats 1-2 meals daily. Pt unable to elaborate on what she may eat for a meal.  Pt unsure of her UBW. Pt states that she has lost weight but is unsure how much. RD suspects weight fluctuations related in part to fluid status given diagnosis of CHF. Per weight history in chart, pt has lost 18 lbs since May 2018 which is not significant for timeframe. More recent weights appear to be stated rather than measured so unsure of accuracy.  Pt would like to continue to receive an oral nutrition supplement and is agreeable to Glucerna given high blood glucose readings.  Meal Completion: 65-100%  Medications reviewed and include: 100 mg Colace BID, Ensure Enlive TID, 40 mg Lasix BID, sliding scale Novolog, 43 units Lantus daily, 40 mg Protonix BID, 20 mEq K-dur BID, Miralax daily, Senokot daily, 1000 units vitamin E daily  Labs reviewed: CO2 34 (H), creatinine 1.99 (H), hemoglobin 9.4 (L), HCT 28.6 (L) CBG's: 91, 288, 384, 308, 276 x 24 hours  UOP: 3850 ml x 24 hours  NUTRITION - FOCUSED PHYSICAL EXAM:    Most Recent Value  Orbital Region  No depletion  Upper Arm Region  No depletion   Thoracic and Lumbar Region  No depletion  Buccal Region  No depletion  Temple Region  No depletion  Clavicle Bone Region  No depletion  Clavicle and Acromion Bone Region  No depletion  Scapular Bone Region  No depletion  Dorsal Hand  No depletion  Patellar Region  No depletion  Anterior Thigh Region  No depletion  Posterior Calf Region  No depletion  Edema (RD Assessment)  Moderate  Hair  Reviewed  Eyes  Reviewed  Mouth  Reviewed  Skin  Reviewed  Nails  Reviewed       Diet Order:   Diet Order           Diet heart healthy/carb modified Room service appropriate? Yes; Fluid consistency: Thin  Diet effective now          EDUCATION NEEDS:   No education needs have been identified at this time  Skin:  Skin Assessment: Reviewed RN Assessment(cellulitis to BLE, ecchymosis to arm)  Last BM:  04/21/18 large type 6  Height:   Ht Readings from Last 1 Encounters:  04/22/18 5\' 6"  (1.676 m)    Weight:   Wt Readings from Last 1 Encounters:  04/24/18 242 lb 9.6 oz (110 kg)    Ideal Body Weight:  59.09 kg  BMI:  Body mass index is 39.16 kg/m.  Estimated Nutritional Needs:   Kcal:  1550-1750 kcal/day  Protein:  90-105 grams/day  Fluid:  >/= 1.5 L/day  Kate Jablonski Dalanie Kisner, MS, RD, LDN Pager: [phone] Weekend/After Hours: [phone]  

## 2018-04-24 NOTE — Progress Notes (Signed)
*  PRELIMINARY RESULTS* Echocardiogram 2D Echocardiogram has been performed.  Sherrie Sport 04/24/2018, 1:19 PM

## 2018-04-24 NOTE — Progress Notes (Signed)
*  PRELIMINARY RESULTS* Echocardiogram 2D Echocardiogram has been performed.  Sherrie Sport 04/24/2018, 11:22 AM

## 2018-04-24 NOTE — Progress Notes (Signed)
*  PRELIMINARY RESULTS* Echocardiogram 2D Echocardiogram has been performed.  Sherrie Sport 04/24/2018, 1:54 PM

## 2018-04-24 NOTE — Progress Notes (Signed)
PT Cancellation Note  Patient Details Name: Carolyn Valdez MRN: [id number] DOB: Feb 16, 1941   Cancelled Treatment:    Reason Eval/Treat Not Completed: Patient declined, no reason specified.  Pt sitting in bed finishing late lunch upon PT arrival.  Nurse reports pt requiring clean-up d/t incontinence in bed and will be coming in soon to do that.  Pt also reporting back being sore from sitting in chair yesterday after therapy and requesting not to participate in therapy today (pt reports nursing aware of back pain and was receiving pain medication for back pain).  Will re-attempt PT treatment at a later date/time.  Leitha Bleak, PT 04/24/18, 4:19 PM [phone]

## 2018-04-24 NOTE — Progress Notes (Signed)
SUBJECTIVE: Patient denies any chest pain but does have shortness of breath and is more alert today.   Vitals:   04/24/18 0456 04/24/18 0459 04/24/18 0614 04/24/18 0815  BP: (!) 176/68 (!) 184/86 (!) 155/58 (!) 165/60  Pulse: 65 71 (!) 55 (!) 55  Resp:      Temp: 97.8 F (36.6 C)   98.6 F (37 C)  TempSrc: Oral   Oral  SpO2: 94% 95%  94%  Weight: 242 lb 9.6 oz (110 kg)     Height:        Intake/Output Summary (Last 24 hours) at 04/24/2018 0831 Last data filed at 04/24/2018 0703 Gross per 24 hour  Intake 600 ml  Output 4100 ml  Net -3500 ml    LABS: Basic Metabolic Panel: Recent Labs    04/23/18 0458 04/24/18 0452  NA 138 141  K 3.8 3.5  CL 99 100  CO2 33* 34*  GLUCOSE 183* 131*  BUN 18 22  CREATININE 1.91* 1.99*  CALCIUM 8.7* 8.9   Liver Function Tests: Recent Labs    04/22/18 1135  AST 17  ALT 8  ALKPHOS 94  BILITOT 0.9  PROT 6.9  ALBUMIN 3.4*   No results for input(s): LIPASE, AMYLASE in the last 72 hours. CBC: Recent Labs    04/22/18 1135 04/23/18 0458  WBC 6.2 6.0  NEUTROABS 3.7  --   HGB 10.8* 9.4*  HCT 32.6* 28.6*  MCV 84.9 84.3  PLT 296 262   Cardiac Enzymes: Recent Labs    04/22/18 1839 04/22/18 2251 04/23/18 0458  TROPONINI 0.08* 0.09* 0.09*   BNP: Invalid input(s): POCBNP D-Dimer: No results for input(s): DDIMER in the last 72 hours. Hemoglobin A1C: Recent Labs    04/22/18 1839  HGBA1C 7.6*   Fasting Lipid Panel: No results for input(s): CHOL, HDL, LDLCALC, TRIG, CHOLHDL, LDLDIRECT in the last 72 hours. Thyroid Function Tests: Recent Labs    04/22/18 1839  TSH 1.026   Anemia Panel: No results for input(s): VITAMINB12, FOLATE, FERRITIN, TIBC, IRON, RETICCTPCT in the last 72 hours.   PHYSICAL EXAM General: Well developed, well nourished, in no acute distress HEENT:  Normocephalic and atramatic Neck:  No JVD.  Lungs: Clear bilaterally to auscultation and percussion. Heart: HRRR . Normal S1 and S2 without gallops or  murmurs.  Abdomen: Bowel sounds are positive, abdomen soft and non-tender  Msk:  Back normal, normal gait. Normal strength and tone for age. Extremities: No clubbing, cyanosis or edema.   Neuro: Alert and oriented X 3. Psych:  Good affect, responds appropriately  TELEMETRY: Normal sinus rhythm ASSESSMENT AND PLAN: Status post supraventricular tachycardia currently in sinus rhythm with elevated troponin due to demand ischemia and altered mental status with renal insufficiency.  Patient is not a candidate for cardiac catheterization.  Active Problems:   Syncope   Atrial fibrillation with RVR (HCC)    Neoma Laming A, MD, [facility] 04/24/2018 8:31 AM    F2

## 2018-04-24 NOTE — Care Management (Signed)
Patient in agreement with recommendation of skilled nursing placement

## 2018-04-24 NOTE — Progress Notes (Signed)
Pt has a severity score of 1 on the RT protocol assessment. She has been refusing treatments because she states she does not need them. The nebulizer treatments were changed to prn. The pt was made aware and is in agreement.

## 2018-04-24 NOTE — Clinical Social Work Note (Signed)
Clinical Social Work Assessment  Patient Details  Name: Carolyn Valdez MRN: [id number] Date of Birth: Jan 30, 1941  Date of referral:  04/24/18               Reason for consult:  Facility Placement                Permission sought to share information with:  Family Supports Permission granted to share information::  Yes, Verbal Permission Granted  Name::     Alain Marion Sister [phone]  [phone]   Agency::  SNF admissions  Relationship::     Contact Information:     Housing/Transportation Living arrangements for the past 2 months:  Apartment Source of Information:  Patient Patient Interpreter Needed:  None Criminal Activity/Legal Involvement Pertinent to Current Situation/Hospitalization:  No - Comment as needed Significant Relationships:  Siblings Lives with:  Self Do you feel safe going back to the place where you live?  Yes Need for family participation in patient care:  No (Coment)  Care giving concerns:  Patient feels she would rather return back home instead of going to SNF.   Social Worker assessment / plan:  Patient is a 77 year old female who is alert and oriented x4.  Patient states she has been to rehab before, and is familiar with the process.  CSW explained how insurance will pay for her stay, and reminded her what to expect at SNF.  Patient states that she has been to rehab in the past, but currently does not want to go to SNF, because she has a little dog that she needs to take care of.  Patient stated she will think about it and let CSW know on Tuesday.  CSW was given permission to begin bed search in [place].  Patient did not have any other questions or concerns.  Employment status:  Retired Nurse, adult PT Recommendations:  [place] / Referral to community resources:  [place]  Patient/Family's Response to care:  Patient would rather return back home, but will think about  SNF.  Patient/Family's Understanding of and Emotional Response to Diagnosis, Current Treatment, and Prognosis:  Patient stated she would rather go back home, instead of going back to SNF.  She is hopeful that she will get well enough to return back home.  Emotional Assessment Appearance:  Appears stated age Attitude/Demeanor/Rapport:    Affect (typically observed):  Appropriate, Pleasant, Stable, Calm Orientation:  Oriented to Self, Oriented to Place, Oriented to  Time, Oriented to Situation Alcohol / Substance use:  Not Applicable Psych involvement (Current and /or in the community):  No (Comment)  Discharge Needs  Concerns to be addressed:  Lack of Support, Care Coordination Readmission within the last 30 days:  No Current discharge risk:  Lack of support system, Lives alone Barriers to Discharge:  Continued Medical Work up   Anell Barr 04/24/2018, 5:36 PM

## 2018-04-24 NOTE — Evaluation (Signed)
Occupational Therapy Evaluation Patient Details Name: Carolyn Valdez MRN: [id number] DOB: 05-23-1941 Today's Date: 04/24/2018    History of Present Illness 77 yo Female came to ED after syncope episode at home. She was found to have hypertension and tachycardia. Patient was admitted to to telemetry; PMH significant: Anxiety, CHF, DM, past RTKA, HTN, HLD, Recurrent UTI;    Clinical Impression   Pt seen for OT evaluation this date d/t general de-conditioning related to syncope after episode of Afib with RVR. Prior to hospital admission, pt was performing most ADLs with Mod I, had a housekeeper for some IADLs, was driving until >1 week prior to admit, and had an in-home caregiver assist with bathing 2x/wk.  Pt lives alone with a little dog in a [facility]. She has a sister who checks on her as well as a Industrial/product designer.  Currently pt demonstrates impairments in standing tolerance and balance as well as decreased independence with LB self care tasks secondary to LE swelling. Pt currently requiring MOD A for sup to sit, MIN A for sit to stand with FWW, MIN A for functional mobility, and requires setup to perform grooming and other UB ADLs seated d/t decreased standing tolerance. Pt would benefit from skilled OT to address noted impairments and functional limitations in order to maximize safety and independence while minimizing falls risk and caregiver burden.  Upon hospital discharge, recommend pt discharge to SNF.    Follow Up Recommendations  SNF    Equipment Recommendations       Recommendations for Other Services       Precautions / Restrictions Precautions Precautions: Fall Restrictions Weight Bearing Restrictions: No      Mobility Bed Mobility Overal bed mobility: Needs Assistance Bed Mobility: Supine to Sit     Supine to sit: Mod assist     General bed mobility comments: with level HOB  Transfers Overall transfer level: Needs assistance Equipment used: Rolling walker (2  wheeled) Transfers: Sit to/from Stand Sit to Stand: Min assist         General transfer comment: with MIN verbal cues and MOD A to scoot forward prior to attempting to stand and MIN cues for safe hand placement.     Balance Overall balance assessment: Needs assistance Sitting-balance support: Bilateral upper extremity supported;Feet supported Sitting balance-Leahy Scale: Fair Sitting balance - Comments: posterior and intermittent lateral lean, uses UE for seated support.    Standing balance support: Bilateral upper extremity supported Standing balance-Leahy Scale: Poor Standing balance comment: MIN A with FWW resulting in P+ static standing.                           ADL either performed or assessed with clinical judgement   ADL Overall ADL's : Needs assistance/impaired Eating/Feeding: Independent   Grooming: Modified independent Grooming Details (indicate cue type and reason): has to sit for sink-side grooming at this time, does not tolerate standing long enough to brush teeth on eval.         Upper Body Dressing : Set up   Lower Body Dressing: Moderate assistance Lower Body Dressing Details (indicate cue type and reason): d/t difficulty managing LEs at this time d/t swelling. Pt uses slide on shoes often at home.              Functional mobility during ADLs: Minimal assistance;Rolling walker General ADL Comments: MIN A with FWW for sit<>stand from EOB     Vision Baseline Vision/History: Wears glasses  Wears Glasses: Reading only Patient Visual Report: No change from baseline       Perception     Praxis      Pertinent Vitals/Pain Pain Assessment: 0-10 Pain Score: 5  Pain Location: BLEs d/t swelling Pain Descriptors / Indicators: Aching Pain Intervention(s): Limited activity within patient's tolerance;Monitored during session;Repositioned     Hand Dominance     Extremity/Trunk Assessment Upper Extremity Assessment Upper Extremity  Assessment: Overall WFL for tasks assessed   Lower Extremity Assessment Lower Extremity Assessment: Defer to PT evaluation RLE Deficits / Details: grossly 3/5   Cervical / Trunk Assessment Cervical / Trunk Assessment: Normal   Communication     Cognition Arousal/Alertness: Awake/alert Behavior During Therapy: WFL for tasks assessed/performed Overall Cognitive Status: Within Functional Limits for tasks assessed                                 General Comments: Pt oriented, however-occasionally loses train of thought and rambles onto different topic. Pt aware when she gets lost in what she is trying to say.    General Comments       Exercises Other Exercises Other Exercises: Pt educated on safe hand/foot placement with use of FWW for functional transfers, instructed on importance of getting OOB (PNA prevention, prevention of muscle wasting) for motivation to participate, and educated on ideal seated positioning to improve efficacy of sit<>stand transfer.    Shoulder Instructions      Home Living Family/patient expects to be discharged to:: Private residence Living Arrangements: Alone Available Help at Discharge: Available PRN/intermittently;Family(has caregiver come 2x/wk for bathing, and housekeeper comes intermittently.) Type of Home: Other(Comment)(condo/patio home) Home Access: Ramped entrance Entrance Stairs-Number of Steps: Ramp is now over 1 step from kitchen to garage. Entrance Stairs-Rails: Right Home Layout: One level     Bathroom Shower/Tub: Occupational psychologist: Handicapped height Bathroom Accessibility: Yes   Home Equipment: Environmental consultant - 2 wheels;Wheelchair - manual;Cane - single point;Shower seat;Grab bars - tub/shower          Prior Functioning/Environment Level of Independence: Independent with assistive device(s);Needs assistance  Gait / Transfers Assistance Needed: used RW for most ambulation; slow;  ADL's / Homemaking Assistance  Needed: had personal care attendant to assist with household chores weekly and caregiver for bathing 2x/wk            OT Problem List: Decreased strength;Decreased activity tolerance;Decreased safety awareness      OT Treatment/Interventions: Self-care/ADL training;Therapeutic exercise;Therapeutic activities;DME and/or AE instruction;Patient/family education    OT Goals(Current goals can be found in the care plan section) Acute Rehab OT Goals Patient Stated Goal: to go home OT Goal Formulation: With patient Time For Goal Achievement: 05/08/18 Potential to Achieve Goals: Good ADL Goals Pt Will Perform Grooming: (standing sink-side with FWW) Pt Will Transfer to Toilet: (with FWW and BSC PRN)  OT Frequency: Min 1X/week   Barriers to D/C:            Co-evaluation              AM-PAC PT "6 Clicks" Daily Activity     Outcome Measure Help from another person eating meals?: None Help from another person taking care of personal grooming?: A Little Help from another person toileting, which includes using toliet, bedpan, or urinal?: A Lot Help from another person bathing (including washing, rinsing, drying)?: A Lot Help from another person to put on and taking off  regular upper body clothing?: A Little Help from another person to put on and taking off regular lower body clothing?: A Lot 6 Click Score: 16   End of Session Equipment Utilized During Treatment: Gait belt;Rolling walker  Activity Tolerance: Patient tolerated treatment well Patient left: in bed;with call bell/phone within reach;with bed alarm set  OT Visit Diagnosis: Unsteadiness on feet (R26.81);Muscle weakness (generalized) (M62.81);Other abnormalities of gait and mobility (R26.89)                Time: 2993-7169 OT Time Calculation (min): 51 min Charges:  OT General Charges $OT Visit: 1 Visit OT Evaluation $OT Eval Moderate Complexity: 1 Mod OT Treatments $Self Care/Home Management : 23-37 mins Gerrianne Scale, MS, OTR/L ascom [phone] or [phone] 04/24/18, 1:31 PM

## 2018-04-24 NOTE — Consult Note (Signed)
Reason for Consult:Syncope Referring Physician: [place]  CC: Syncope, AMS  HPI: Carolyn Valdez is an 77 y.o. female admitted 7/6 with syncope.  Patient has difficulty providing history therefore most of the history obtained from the chart.  The patient stated she was trying to get out of her recliner that she sleeps in this morning, when she passed out and came to on the ground. She stated that she does not think she hit her head and did not have any pain currently. She reports generalized weakness over the last year, although it has been gradually getting worse. She states that she has been intermittently compliant with her medications because she feels that they sometimes make her nauseous and throw up. On yesterday she developed some confusion but this has improved today.  Reports though that she loses tack of what she wants to say.      Past Medical History:  Diagnosis Date  . Anxiety   . Breast cancer ([place]) 1997   right breast mastectomy  . CHF (congestive heart failure) ([place])   . Diabetes mellitus without complication ([place])   . DJD (degenerative joint disease)   . Dysuria   . H/O total knee replacement    right  . HTN (hypertension)   . Hypercholesteremia   . Kidney stone   . Obesity   . Recurrent urinary tract infection   . SVT (supraventricular tachycardia) ([place])   . Urinary incontinence   . Vaginal atrophy   . Yeast vaginitis     Past Surgical History:  Procedure Laterality Date  . CARDIAC CATHETERIZATION    . CHOLECYSTECTOMY    . FOOT SURGERY    . JOINT REPLACEMENT    . MASTECTOMY Right 1997  . NASAL SEPTUM SURGERY    . PERIPHERAL VASCULAR CATHETERIZATION N/A 07/08/2015   Procedure: PICC Line Insertion;  Surgeon: Algernon Huxley, MD;  Location: [facility];  Service: Cardiovascular;  Laterality: N/A;  . TONSILLECTOMY    . Vocal cords      Family History  Problem Relation Age of Onset  . Lung cancer Father   . Hematuria Mother   . Lung cancer Mother   .  Kidney disease Neg Hx   . Bladder Cancer Neg Hx   . Breast cancer Neg Hx     Social History:  reports that she has quit smoking. She quit smokeless tobacco use about 22 years ago. She reports that she does not drink alcohol or use drugs.  Allergies  Allergen Reactions  . Sulfa Antibiotics Hives  . Biaxin [Clarithromycin] Hives  . Influenza A (H1n1) Monoval Vac Other (See Comments)    Pt states that she was told by her MD not to get the influenza vaccine.    . Morphine Other (See Comments)    Reaction:  Dizziness and confusion   . Pyridium [Phenazopyridine Hcl] Other (See Comments)    Reaction:  Unknown   . Ceftriaxone Anxiety  . Latex Rash  . Prednisone Rash  . Tape Rash    Medications:  I have reviewed the patient's current medications. Prior to Admission:  Medications Prior to Admission  Medication Sig Dispense Refill Last Dose  . acetaminophen (TYLENOL) 650 MG CR tablet Take 650 mg by mouth every 6 (six) hours as needed for pain.   PRN at PRN  . acidophilus (RISAQUAD) CAPS capsule Take 1 capsule by mouth at bedtime.   04/21/2018 at 2100  . atorvastatin (LIPITOR) 20 MG tablet Take 20 mg by  mouth at bedtime.    04/21/2018 at 2100  . citalopram (CELEXA) 10 MG tablet Take 1 tablet (10 mg total) by mouth daily. 30 tablet 0 04/21/2018 at 0800  . docusate sodium (COLACE) 100 MG capsule Take 100 mg by mouth 2 (two) times daily.    04/21/2018 at 1900  . furosemide (LASIX) 40 MG tablet Take 1 tablet (40 mg total) by mouth 2 (two) times daily. 60 tablet 0 04/21/2018 at 1900  . glipiZIDE (GLUCOTROL) 5 MG tablet Take 5 mg by mouth every evening.   04/21/2018 at 1900  . hydrOXYzine (ATARAX/VISTARIL) 25 MG tablet Take 1 tablet by mouth at bedtime.    04/21/2018 at 2100  . iron polysaccharides (NIFEREX) 150 MG capsule Take 150 mg by mouth 3 (three) times daily.    04/21/2018 at 2100  . lamoTRIgine (LAMICTAL) 25 MG tablet Take 25 mg by mouth 2 (two) times daily.    04/21/2018 at 1900  . levETIRAcetam (KEPPRA)  250 MG tablet Take 1 tablet (250 mg total) by mouth 2 (two) times daily. 30 tablet 0 04/21/2018 at 1900  . loratadine (CLARITIN) 10 MG tablet Take 10 mg by mouth daily.   04/21/2018 at 0800  . LORazepam (ATIVAN) 0.5 MG tablet Take 1 tablet (0.5 mg total) by mouth 2 (two) times daily as needed for anxiety. (Patient taking differently: Take 0.5 mg by mouth 3 (three) times daily as needed for anxiety. ) 10 tablet 0 PRN at PRN  . meclizine (ANTIVERT) 12.5 MG tablet Take 6.25 mg by mouth 3 (three) times daily as needed for dizziness.   PRN at PRN  . Melatonin 3 MG TABS Take 6 mg by mouth at bedtime.   04/21/2018 at 2100  . metFORMIN (GLUCOPHAGE) 500 MG tablet Take 1,000 mg by mouth 2 (two) times daily with a meal.   04/21/2018 at 1900  . metoprolol (LOPRESSOR) 50 MG tablet Take 50 mg by mouth 2 (two) times daily.   04/21/2018 at 1900  . nitrofurantoin (MACRODANTIN) 100 MG capsule Take 1 capsule (100 mg total) by mouth daily. 30 capsule 11 04/21/2018 at 1900  . pantoprazole (PROTONIX) 40 MG tablet Take 40 mg by mouth 2 (two) times daily.   04/21/2018 at 1900  . senna (SENOKOT) 8.6 MG TABS tablet Take 1 tablet (8.6 mg total) by mouth daily. 120 each 0 04/21/2018 at 0800  . tolterodine (DETROL LA) 4 MG 24 hr capsule Take 4 mg by mouth at bedtime.   04/21/2018 at 2100  . trimethoprim (TRIMPEX) 100 MG tablet Take 50 mg by mouth at bedtime.   04/21/2018 at 2100  . ciprofloxacin (CIPRO) 250 MG tablet Take 1 tablet (250 mg total) by mouth 2 (two) times daily. (Patient not taking: Reported on 04/22/2018) 14 tablet 7 Not Taking at Unknown time  . feeding supplement, ENSURE ENLIVE, (ENSURE ENLIVE) LIQD Take 237 mLs by mouth 3 (three) times daily between meals. (Patient not taking: Reported on 04/22/2018) 237 mL 12 Not Taking at Unknown time  . insulin glargine (LANTUS) 100 UNIT/ML injection Inject 0.43 mLs (43 Units total) into the skin at bedtime. (Patient not taking: Reported on 04/22/2018) 15 mL 0 Not Taking at Unknown time  .  ipratropium-albuterol (DUONEB) 0.5-2.5 (3) MG/3ML SOLN Take 3 mLs by nebulization 3 (three) times daily. (Patient not taking: Reported on 04/22/2018) 360 mL 0 Not Taking at Unknown time   Scheduled: . aspirin EC  81 mg Oral Daily  . citalopram  10 mg Oral Daily  .  docusate sodium  100 mg Oral BID  . feeding supplement (GLUCERNA SHAKE)  237 mL Oral BID BM  . furosemide  40 mg Intravenous BID  . guaiFENesin  600 mg Oral BID  . heparin  5,000 Units Subcutaneous Q8H  . hydrALAZINE  25 mg Oral Q8H  . insulin aspart  0-5 Units Subcutaneous QHS  . insulin aspart  0-9 Units Subcutaneous TID WC  . insulin aspart  4 Units Subcutaneous TID WC  . insulin glargine  43 Units Subcutaneous QHS  . iron polysaccharides  150 mg Oral TID  . lamoTRIgine  25 mg Oral Daily  . levETIRAcetam  250 mg Oral BID  . loratadine  10 mg Oral Daily  . metoprolol tartrate  50 mg Oral BID  . nitrofurantoin  100 mg Oral Daily  . OLANZapine zydis  10 mg Oral QHS  . pantoprazole  40 mg Oral BID  . polyethylene glycol  17 g Oral Daily  . potassium chloride  20 mEq Oral BID  . senna  1 tablet Oral Daily  . vitamin E  1,000 Units Oral Daily    ROS: History obtained from the patient  General ROS: negative for - chills, fatigue, fever, night sweats, weight gain or weight loss Psychological ROS: memory difficulties Ophthalmic ROS: negative for - blurry vision, double vision, eye pain or loss of vision ENT ROS: negative for - epistaxis, nasal discharge, oral lesions, sore throat, tinnitus or vertigo Allergy and Immunology ROS: negative for - hives or itchy/watery eyes Hematological and Lymphatic ROS: negative for - bleeding problems, bruising or swollen lymph nodes Endocrine ROS: negative for - galactorrhea, hair pattern changes, polydipsia/polyuria or temperature intolerance Respiratory ROS: negative for - cough, hemoptysis, shortness of breath or wheezing Cardiovascular ROS: LE edema Gastrointestinal ROS:as noted in  HPI Genito-Urinary ROS: negative for - dysuria, hematuria, incontinence or urinary frequency/urgency Musculoskeletal ROS: negative for - joint swelling or muscular weakness Neurological ROS: as noted in HPI Dermatological ROS: negative for rash and skin lesion changes  Physical Examination: Blood pressure (!) 165/60, pulse 75, temperature 98.6 F (37 C), temperature source Oral, resp. rate 18, height 5\' 6"  (1.676 m), weight 110 kg (242 lb 9.6 oz), SpO2 94 %.  HEENT-  Normocephalic, no lesions, without obvious abnormality.  Normal external eye and conjunctiva.  Normal TM's bilaterally.  Normal auditory canals and external ears. Normal external nose, mucus membranes and septum.  Normal pharynx. Cardiovascular- S1, S2 normal, pulses palpable throughout   Lungs- chest clear, no wheezing, rales, normal symmetric air entry Abdomen- soft, non-tender; bowel sounds normal; no masses,  no organomegaly Extremities- LE edema Lymph-no adenopathy palpable Musculoskeletal-no joint tenderness, deformity or swelling Skin-rash on lower legs  Neurological Examination   Mental Status: Alert, oriented, thought content appropriate.  Speech fluent without evidence of aphasia.  Able to follow 3 step commands without difficulty. Cranial Nerves: II: Discs flat bilaterally; Visual fields grossly normal, pupils equal, round, reactive to light and accommodation III,IV, VI: ptosis not present, extra-ocular motions intact bilaterally V,VII: smile symmetric, facial light touch sensation normal bilaterally VIII: hearing normal bilaterally IX,X: gag reflex present XI: bilateral shoulder shrug XII: midline tongue extension Motor: Right : Upper extremity   5/5    Left:     Upper extremity   5/5  Lower extremity   5/5     Lower extremity   5/5 Tone and bulk:normal tone throughout; no atrophy noted Sensory: Pinprick and light touch intact throughout, bilaterally Deep Tendon Reflexes: 2+in the  upper extremities and  absent in the lower extremities Plantars: Right: mute   Left: mute Cerebellar: Normal finger-to-nose testing bilaterally Gait: not tested due to safety concerns  Laboratory Studies:   Basic Metabolic Panel: Recent Labs  Lab 04/22/18 1135 04/23/18 0458 04/24/18 0452  NA 135 138 141  K 3.9 3.8 3.5  CL 94* 99 100  CO2 30 33* 34*  GLUCOSE 355* 183* 131*  BUN 20 18 22   CREATININE 1.93* 1.91* 1.99*  CALCIUM 9.1 8.7* 8.9    Liver Function Tests: Recent Labs  Lab 04/22/18 1135  AST 17  ALT 8  ALKPHOS 94  BILITOT 0.9  PROT 6.9  ALBUMIN 3.4*   No results for input(s): LIPASE, AMYLASE in the last 168 hours. No results for input(s): AMMONIA in the last 168 hours.  CBC: Recent Labs  Lab 04/22/18 1135 04/23/18 0458  WBC 6.2 6.0  NEUTROABS 3.7  --   HGB 10.8* 9.4*  HCT 32.6* 28.6*  MCV 84.9 84.3  PLT 296 262    Cardiac Enzymes: Recent Labs  Lab 04/22/18 1135 04/22/18 1839 04/22/18 2251 04/23/18 0458  TROPONINI <0.03 0.08* 0.09* 0.09*    BNP: Invalid input(s): POCBNP  CBG: Recent Labs  Lab 04/23/18 1236 04/23/18 1643 04/23/18 2114 04/24/18 0816 04/24/18 1141  GLUCAP 308* 384* 288* 91 273*    Microbiology: Results for orders placed or performed during the hospital encounter of 04/22/18  Blood culture (routine x 2)     Status: None (Preliminary result)   Collection Time: 04/22/18 11:35 AM  Result Value Ref Range Status   Specimen Description BLOOD BLOOD LEFT HAND  Final   Special Requests   Final    BOTTLES DRAWN AEROBIC AND ANAEROBIC Blood Culture adequate volume   Culture   Final    NO GROWTH 2 DAYS Performed at [facility], [address] [place], [place] [address]    Report Status PENDING  Incomplete  Blood culture (routine x 2)     Status: None (Preliminary result)   Collection Time: 04/22/18 11:35 AM  Result Value Ref Range Status   Specimen Description BLOOD LEFT ANTECUBITAL  Final   Special Requests   Final    BOTTLES  DRAWN AEROBIC AND ANAEROBIC Blood Culture adequate volume   Culture   Final    NO GROWTH 2 DAYS Performed at [facility], [address] [place], [place] [address]    Report Status PENDING  Incomplete  MRSA PCR Screening     Status: None   Collection Time: 04/22/18  9:15 PM  Result Value Ref Range Status   MRSA by PCR NEGATIVE NEGATIVE Final    Comment:        The GeneXpert MRSA Assay (FDA approved for NASAL specimens only), is one component of a comprehensive MRSA colonization surveillance program. It is not intended to diagnose MRSA infection nor to guide or monitor treatment for MRSA infections. Performed at [facility], [address] [place],  [address]     Coagulation Studies: No results for input(s): LABPROT, INR in the last 72 hours.  Urinalysis:  Recent Labs  Lab 04/22/18 1234  COLORURINE STRAW*  LABSPEC 1.005  PHURINE 8.0  GLUCOSEU >=500*  HGBUR SMALL*  BILIRUBINUR NEGATIVE  KETONESUR NEGATIVE  PROTEINUR 100*  NITRITE NEGATIVE  LEUKOCYTESUR NEGATIVE    Lipid Panel:     Component Value Date/Time   CHOL 159 09/06/2015 0126   TRIG 152 (H) 09/06/2015 0126   HDL 32 (L)  09/06/2015 0126   CHOLHDL 5.0 09/06/2015 0126   VLDL 30 09/06/2015 0126   LDLCALC 97 09/06/2015 0126    HgbA1C:  Lab Results  Component Value Date   HGBA1C 7.6 (H) 04/22/2018    Urine Drug Screen:  No results found for: LABOPIA, COCAINSCRNUR, LABBENZ, AMPHETMU, THCU, LABBARB  Alcohol Level: No results for input(s): ETH in the last 168 hours.  Other results: EKG: sinus rhythm at 88 bpm.   Imaging: Ct Head Wo Contrast  Result Date: 04/23/2018 CLINICAL DATA:  Altered mental status.  Breast cancer. EXAM: CT HEAD WITHOUT CONTRAST TECHNIQUE: Contiguous axial images were obtained from the base of the skull through the vertex without intravenous contrast. COMPARISON:  MR head 10/22/2016.  CT head 10/21/2016. FINDINGS: Brain: No evidence for acute  infarction, hemorrhage, mass lesion, or extra-axial fluid. Generalized atrophy. Hydrocephalus ex vacuo. Extensive hypoattenuation of white matter, likely small vessel disease versus post treatment effect. Vascular: No hyperdense vessel or unexpected calcification. Skull: Calvarium intact.  No destructive osseous lesion. Sinuses/Orbits: Clear sinuses.  No orbital findings of significance. Other: None. IMPRESSION: Atrophy and white matter disease similar to priors. No acute findings. Within limits for detection on noncontrast CT, no intracranial metastatic disease. Electronically Signed   By: Staci Righter M.D.   On: 04/23/2018 13:12   US Carotid Bilateral  Result Date: 04/23/2018 CLINICAL DATA:  Syncopal episode. History of hypertension, hyperlipidemia and diabetes. Former smoker EXAM: BILATERAL CAROTID DUPLEX ULTRASOUND TECHNIQUE: Pearline Cables scale imaging, color Doppler and duplex ultrasound were performed of bilateral carotid and vertebral arteries in the neck. COMPARISON:  None. FINDINGS: Criteria: Quantification of carotid stenosis is based on velocity parameters that correlate the residual internal carotid diameter with NASCET-based stenosis levels, using the diameter of the distal internal carotid lumen as the denominator for stenosis measurement. The following velocity measurements were obtained: RIGHT ICA:  122/33 cm/sec CCA:  18/56 cm/sec SYSTOLIC ICA/CCA RATIO:  1.4 ECA:  107 cm/sec LEFT ICA:  78/18 cm/sec CCA:  31/4 cm/sec SYSTOLIC ICA/CCA RATIO:  1.0 ECA:  91 cm/sec RIGHT CAROTID ARTERY: There is a minimal amount of eccentric mixed echogenic plaque within the right carotid bulb (image 16), extending to involve the origin and proximal aspects of the right internal carotid artery (image 23), not resulting in elevated peak systolic velocities within the interrogated course of the right internal carotid artery to suggest a hemodynamically significant stenosis. RIGHT VERTEBRAL ARTERY:  Antegrade flow LEFT CAROTID  ARTERY: There is a minimal amount of atherosclerotic plaque within the left carotid bulb (image 49), not resulting in elevated peak systolic velocities within the interrogated course of the left internal carotid artery to suggest a hemodynamically significant stenosis. LEFT VERTEBRAL ARTERY:  Antegrade flow IMPRESSION: Minimal amount of bilateral atherosclerotic plaque, right greater than left, not resulting in a hemodynamically significant stenosis within either internal carotid artery. Electronically Signed   By: Sandi Mariscal M.D.   On: 04/23/2018 08:15   US Venous Img Lower Bilateral  Result Date: 04/24/2018 CLINICAL DATA:  Lower extremity pain and edema EXAM: BILATERAL LOWER EXTREMITY VENOUS DOPPLER ULTRASOUND TECHNIQUE: Gray-scale sonography with graded compression, as well as color Doppler and duplex ultrasound were performed to evaluate the lower extremity deep venous systems from the level of the common femoral vein and including the common femoral, femoral, profunda femoral, popliteal and calf veins including the posterior tibial, peroneal and gastrocnemius veins when visible. The superficial great saphenous vein was also interrogated. Spectral Doppler was utilized to evaluate flow at rest and  with distal augmentation maneuvers in the common femoral, femoral and popliteal veins. COMPARISON:  None. FINDINGS: RIGHT LOWER EXTREMITY Common Femoral Vein: No evidence of thrombus. Normal compressibility, respiratory phasicity and response to augmentation. Saphenofemoral Junction: No evidence of thrombus. Normal compressibility and flow on color Doppler imaging. Profunda Femoral Vein: No evidence of thrombus. Normal compressibility and flow on color Doppler imaging. Femoral Vein: No evidence of thrombus. Normal compressibility, respiratory phasicity and response to augmentation. Popliteal Vein: No evidence of thrombus. Normal compressibility, respiratory phasicity and response to augmentation. Calf Veins: Limited  assessment of the calf veins. Posterior tibial vein appears patent. Peroneal vein not visualized. Superficial Great Saphenous Vein: No evidence of thrombus. Normal compressibility. Venous Reflux:  None. Other Findings:  None. LEFT LOWER EXTREMITY Common Femoral Vein: No evidence of thrombus. Normal compressibility, respiratory phasicity and response to augmentation. Saphenofemoral Junction: No evidence of thrombus. Normal compressibility and flow on color Doppler imaging. Profunda Femoral Vein: No evidence of thrombus. Normal compressibility and flow on color Doppler imaging. Femoral Vein: No evidence of thrombus. Normal compressibility, respiratory phasicity and response to augmentation. Popliteal Vein: No evidence of thrombus. Normal compressibility, respiratory phasicity and response to augmentation. Calf Veins: Limited assessment of the calf veins. Posterior tibial vein appears patent. Peroneal vein not visualized. Superficial Great Saphenous Vein: No evidence of thrombus. Normal compressibility. Venous Reflux:  None. Other Findings:  None. IMPRESSION: No significant DVT demonstrated in either extremity. Limited assessment of the calf veins. Electronically Signed   By: Jerilynn Mages.  Shick M.D.   On: 04/24/2018 11:19     Assessment/Plan: 77 year old female with altered mental status and syncope.  She has been noted on telemetry to have some blood pressure and cardiac abnormalities which may have contributed to her syncopal event.  Head CT reviewed and unremarkable.  Patient has had previous admissions with catatonia and altered mental status.  Unclear if yesterday's event may have been related to the same.  Wit her complaint of feeling as if she loses track of what she wants to say will investigate further but it is encouraging that the patient is improving without intervention.    Recommendations: 1. EEG  Alexis Goodell, MD Neurology [phone] 04/24/2018, 3:26 PM

## 2018-04-24 NOTE — Progress Notes (Signed)
Patient ID: Carolyn Valdez, female   DOB: March 29, 1941, 77 y.o.   MRN: [id number]  [facility] PROGRESS NOTE  Carolyn Valdez AOZ:[id number] DOB: 1940-11-16 DOA: 04/22/2018 PCP: Leonel Ramsay, MD  HPI/Subjective: Patient feeling fatigue still.  Some leg pain.  Objective: Vitals:   04/24/18 0815 04/24/18 0847  BP: (!) 165/60   Pulse: (!) 55 75  Resp:    Temp: 98.6 F (37 C)   SpO2: 94%     Filed Weights   04/22/18 1651 04/23/18 0341 04/24/18 0456  Weight: 111.5 kg (245 lb 13 oz) 111.8 kg (246 lb 6.4 oz) 110 kg (242 lb 9.6 oz)    ROS: Review of Systems  Constitutional: Positive for malaise/fatigue. Negative for chills and fever.  Eyes: Negative for blurred vision.  Respiratory: Negative for cough and shortness of breath.   Cardiovascular: Negative for chest pain.  Gastrointestinal: Negative for abdominal pain, constipation, diarrhea, nausea and vomiting.  Genitourinary: Negative for dysuria.  Musculoskeletal: Positive for myalgias. Negative for joint pain.  Neurological: Negative for dizziness and headaches.   Exam: Physical Exam  HENT:  Nose: No mucosal edema.  Mouth/Throat: No oropharyngeal exudate or posterior oropharyngeal edema.  Eyes: Pupils are equal, round, and reactive to light. Conjunctivae and lids are normal.  Neck: Neck supple. No JVD present. Carotid bruit is not present.  Cardiovascular: Regular rhythm, S1 normal and S2 normal.  Murmur heard.  Systolic murmur is present with a grade of 2/6. Respiratory: She has decreased breath sounds in the right lower field and the left middle field. She has rales in the right lower field and the left lower field.  GI: Soft. Bowel sounds are normal. There is no tenderness.  Musculoskeletal:       Right ankle: She exhibits swelling.       Left ankle: She exhibits swelling.  Neurological: She is alert.  Skin: Skin is warm. Nails show no clubbing.  Chronic lower extremity swelling.  Psychiatric: Her affect is blunt.       Data Reviewed: Basic Metabolic Panel: Recent Labs  Lab 04/22/18 1135 04/23/18 0458 04/24/18 0452  NA 135 138 141  K 3.9 3.8 3.5  CL 94* 99 100  CO2 30 33* 34*  GLUCOSE 355* 183* 131*  BUN 20 18 22   CREATININE 1.93* 1.91* 1.99*  CALCIUM 9.1 8.7* 8.9   Liver Function Tests: Recent Labs  Lab 04/22/18 1135  AST 17  ALT 8  ALKPHOS 94  BILITOT 0.9  PROT 6.9  ALBUMIN 3.4*   CBC: Recent Labs  Lab 04/22/18 1135 04/23/18 0458  WBC 6.2 6.0  NEUTROABS 3.7  --   HGB 10.8* 9.4*  HCT 32.6* 28.6*  MCV 84.9 84.3  PLT 296 262   Cardiac Enzymes: Recent Labs  Lab 04/22/18 1135 04/22/18 1839 04/22/18 2251 04/23/18 0458  TROPONINI <0.03 0.08* 0.09* 0.09*   BNP (last 3 results) Recent Labs    04/22/18 1135  BNP 335.0*     CBG: Recent Labs  Lab 04/23/18 1236 04/23/18 1643 04/23/18 2114 04/24/18 0816 04/24/18 1141  GLUCAP 308* 384* 288* 91 273*    Recent Results (from the past 240 hour(s))  Blood culture (routine x 2)     Status: None (Preliminary result)   Collection Time: 04/22/18 11:35 AM  Result Value Ref Range Status   Specimen Description BLOOD BLOOD LEFT HAND  Final   Special Requests   Final    BOTTLES DRAWN AEROBIC AND ANAEROBIC Blood Culture adequate volume  Culture   Final    NO GROWTH 2 DAYS Performed at [facility], [address] [place], [place] [address]    Report Status PENDING  Incomplete  Blood culture (routine x 2)     Status: None (Preliminary result)   Collection Time: 04/22/18 11:35 AM  Result Value Ref Range Status   Specimen Description BLOOD LEFT ANTECUBITAL  Final   Special Requests   Final    BOTTLES DRAWN AEROBIC AND ANAEROBIC Blood Culture adequate volume   Culture   Final    NO GROWTH 2 DAYS Performed at [facility], [address], [place], [place] [address]    Report Status PENDING  Incomplete  MRSA PCR Screening     Status: None   Collection Time: 04/22/18  9:15 PM  Result Value Ref  Range Status   MRSA by PCR NEGATIVE NEGATIVE Final    Comment:        The GeneXpert MRSA Assay (FDA approved for NASAL specimens only), is one component of a comprehensive MRSA colonization surveillance program. It is not intended to diagnose MRSA infection nor to guide or monitor treatment for MRSA infections. Performed at [facility], [address] [place],  [address]      Studies: Ct Head Wo Contrast  Result Date: 04/23/2018 CLINICAL DATA:  Altered mental status.  Breast cancer. EXAM: CT HEAD WITHOUT CONTRAST TECHNIQUE: Contiguous axial images were obtained from the base of the skull through the vertex without intravenous contrast. COMPARISON:  MR head 10/22/2016.  CT head 10/21/2016. FINDINGS: Brain: No evidence for acute infarction, hemorrhage, mass lesion, or extra-axial fluid. Generalized atrophy. Hydrocephalus ex vacuo. Extensive hypoattenuation of white matter, likely small vessel disease versus post treatment effect. Vascular: No hyperdense vessel or unexpected calcification. Skull: Calvarium intact.  No destructive osseous lesion. Sinuses/Orbits: Clear sinuses.  No orbital findings of significance. Other: None. IMPRESSION: Atrophy and white matter disease similar to priors. No acute findings. Within limits for detection on noncontrast CT, no intracranial metastatic disease. Electronically Signed   By: Staci Righter M.D.   On: 04/23/2018 13:12   US Carotid Bilateral  Result Date: 04/23/2018 CLINICAL DATA:  Syncopal episode. History of hypertension, hyperlipidemia and diabetes. Former smoker EXAM: BILATERAL CAROTID DUPLEX ULTRASOUND TECHNIQUE: Pearline Cables scale imaging, color Doppler and duplex ultrasound were performed of bilateral carotid and vertebral arteries in the neck. COMPARISON:  None. FINDINGS: Criteria: Quantification of carotid stenosis is based on velocity parameters that correlate the residual internal carotid diameter with NASCET-based stenosis levels, using  the diameter of the distal internal carotid lumen as the denominator for stenosis measurement. The following velocity measurements were obtained: RIGHT ICA:  122/33 cm/sec CCA:  81/44 cm/sec SYSTOLIC ICA/CCA RATIO:  1.4 ECA:  107 cm/sec LEFT ICA:  78/18 cm/sec CCA:  81/8 cm/sec SYSTOLIC ICA/CCA RATIO:  1.0 ECA:  91 cm/sec RIGHT CAROTID ARTERY: There is a minimal amount of eccentric mixed echogenic plaque within the right carotid bulb (image 16), extending to involve the origin and proximal aspects of the right internal carotid artery (image 23), not resulting in elevated peak systolic velocities within the interrogated course of the right internal carotid artery to suggest a hemodynamically significant stenosis. RIGHT VERTEBRAL ARTERY:  Antegrade flow LEFT CAROTID ARTERY: There is a minimal amount of atherosclerotic plaque within the left carotid bulb (image 49), not resulting in elevated peak systolic velocities within the interrogated course of the left internal carotid artery to suggest a hemodynamically significant stenosis. LEFT VERTEBRAL ARTERY:  Antegrade flow IMPRESSION: Minimal amount of bilateral atherosclerotic plaque, right greater than left, not resulting in a hemodynamically significant stenosis within either internal carotid artery. Electronically Signed   By: Sandi Mariscal M.D.   On: 04/23/2018 08:15   US Venous Img Lower Bilateral  Result Date: 04/24/2018 CLINICAL DATA:  Lower extremity pain and edema EXAM: BILATERAL LOWER EXTREMITY VENOUS DOPPLER ULTRASOUND TECHNIQUE: Gray-scale sonography with graded compression, as well as color Doppler and duplex ultrasound were performed to evaluate the lower extremity deep venous systems from the level of the common femoral vein and including the common femoral, femoral, profunda femoral, popliteal and calf veins including the posterior tibial, peroneal and gastrocnemius veins when visible. The superficial great saphenous vein was also interrogated. Spectral  Doppler was utilized to evaluate flow at rest and with distal augmentation maneuvers in the common femoral, femoral and popliteal veins. COMPARISON:  None. FINDINGS: RIGHT LOWER EXTREMITY Common Femoral Vein: No evidence of thrombus. Normal compressibility, respiratory phasicity and response to augmentation. Saphenofemoral Junction: No evidence of thrombus. Normal compressibility and flow on color Doppler imaging. Profunda Femoral Vein: No evidence of thrombus. Normal compressibility and flow on color Doppler imaging. Femoral Vein: No evidence of thrombus. Normal compressibility, respiratory phasicity and response to augmentation. Popliteal Vein: No evidence of thrombus. Normal compressibility, respiratory phasicity and response to augmentation. Calf Veins: Limited assessment of the calf veins. Posterior tibial vein appears patent. Peroneal vein not visualized. Superficial Great Saphenous Vein: No evidence of thrombus. Normal compressibility. Venous Reflux:  None. Other Findings:  None. LEFT LOWER EXTREMITY Common Femoral Vein: No evidence of thrombus. Normal compressibility, respiratory phasicity and response to augmentation. Saphenofemoral Junction: No evidence of thrombus. Normal compressibility and flow on color Doppler imaging. Profunda Femoral Vein: No evidence of thrombus. Normal compressibility and flow on color Doppler imaging. Femoral Vein: No evidence of thrombus. Normal compressibility, respiratory phasicity and response to augmentation. Popliteal Vein: No evidence of thrombus. Normal compressibility, respiratory phasicity and response to augmentation. Calf Veins: Limited assessment of the calf veins. Posterior tibial vein appears patent. Peroneal vein not visualized. Superficial Great Saphenous Vein: No evidence of thrombus. Normal compressibility. Venous Reflux:  None. Other Findings:  None. IMPRESSION: No significant DVT demonstrated in either extremity. Limited assessment of the calf veins.  Electronically Signed   By: Jerilynn Mages.  Shick M.D.   On: 04/24/2018 11:19    Scheduled Meds: . aspirin EC  81 mg Oral Daily  . citalopram  10 mg Oral Daily  . docusate sodium  100 mg Oral BID  . feeding supplement (GLUCERNA SHAKE)  237 mL Oral BID BM  . furosemide  40 mg Intravenous BID  . guaiFENesin  600 mg Oral BID  . heparin  5,000 Units Subcutaneous Q8H  . hydrALAZINE  25 mg Oral Q8H  . insulin aspart  0-5 Units Subcutaneous QHS  . insulin aspart  0-9 Units Subcutaneous TID WC  . insulin aspart  4 Units Subcutaneous TID WC  . insulin glargine  43 Units Subcutaneous QHS  . iron polysaccharides  150 mg Oral TID  . lamoTRIgine  25 mg Oral Daily  . levETIRAcetam  250 mg Oral BID  . loratadine  10 mg Oral Daily  . metoprolol tartrate  50 mg Oral BID  . nitrofurantoin  100 mg Oral Daily  . OLANZapine zydis  10 mg Oral QHS  . pantoprazole  40 mg Oral BID  . polyethylene glycol  17 g Oral Daily  . potassium chloride  20 mEq Oral BID  .  senna  1 tablet Oral Daily  . vitamin E  1,000 Units Oral Daily    Assessment/Plan:  1. Atrial fibrillation with rapid ventricular response and SVT.  Patient now in normal sinus rhythm.  Continue metoprolol 50 mg twice daily.  Patient on aspirin for anticoagulation. 2. Acute encephalopathy.  This is better today.  She was able to answer questions a little bit better today.  I stopped Ativan, Vistaril and anything else that can cause altered mental status. 3. Acute kidney injury watch with diuresis. 4. Chronic diastolic congestive heart failure and lower extremity edema/anasarca on IV Lasix which I increase the dose today. 5. History of seizure on lamotrigine and Keppra 6. 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 7. Insulin-dependent diabetes mellitus on Lantus and sliding scale  Code Status:     Code Status Orders  (From admission, onward)        Start     Ordered   04/22/18 1702  Do not attempt resuscitation (DNR)   Continuous    Question Answer Comment  In the event of cardiac or respiratory ARREST Do not call a "code blue"   In the event of cardiac or respiratory ARREST Do not perform Intubation, CPR, defibrillation or ACLS   In the event of cardiac or respiratory ARREST Use medication by any route, position, wound care, and other measures to relive pain and suffering. May use oxygen, suction and manual treatment of airway obstruction as needed for comfort.      04/22/18 1701    Code Status History    Date Active Date Inactive Code Status Order ID Comments User Context   02/21/2017 1629 02/24/2017 1747 DNR [id number]  Fritzi Mandes, MD Inpatient   01/31/2017 1617 02/04/2017 1350 DNR [id number]  Max Sane, MD Inpatient   01/23/2017 1320 01/31/2017 1617 Full Code [id number]  Henreitta Leber, MD Inpatient   10/05/2016 1216 10/26/2016 1740 DNR [id number]  Knox Royalty, NP Inpatient   09/24/2016 1731 10/05/2016 1216 Full Code [id number]  Gladstone Lighter, MD Inpatient   09/01/2016 0217 09/05/2016 1628 Full Code [id number]  Hugelmeyer, [place], DO Inpatient   08/09/2016 2027 08/12/2016 2015 Full Code [id number]  Henreitta Leber, MD Inpatient   09/30/2015 1423 10/07/2015 1737 Full Code [id number]  Loletha Grayer, MD ED   09/05/2015 1756 09/09/2015 1738 DNR [id number]  Loletha Grayer, MD ED    Advance Directive Documentation     Most Recent Value  Type of Advance Directive  Living will, Out of facility DNR (pink MOST or yellow form)  Pre-existing out of facility DNR order (yellow form or pink MOST form)  Yellow form placed in chart (order not valid for inpatient use)  "MOST" Form in Place?  -     Family Communication: Spoke with sister on the phone yesterday Disposition Plan: Likely will need rehab.  Patient lives alone in her [place].  Consultants:  Cardiology  Neurology  Time spent: 26 minutes.  Deval Mroczka [employer]

## 2018-04-25 ENCOUNTER — Inpatient Hospital Stay: Payer: Medicare Other

## 2018-04-25 DIAGNOSIS — R55 Syncope and collapse: Secondary | ICD-10-CM

## 2018-04-25 DIAGNOSIS — Z7189 Other specified counseling: Secondary | ICD-10-CM

## 2018-04-25 DIAGNOSIS — Z515 Encounter for palliative care: Secondary | ICD-10-CM

## 2018-04-25 DIAGNOSIS — I4891 Unspecified atrial fibrillation: Principal | ICD-10-CM

## 2018-04-25 LAB — BASIC METABOLIC PANEL
ANION GAP: 8 (ref 5–15)
BUN: 26 mg/dL — ABNORMAL HIGH (ref 8–23)
CALCIUM: 9.3 mg/dL (ref 8.9–10.3)
CHLORIDE: 97 mmol/L — AB (ref 98–111)
CO2: 35 mmol/L — AB (ref 22–32)
CREATININE: 2.03 mg/dL — AB (ref 0.44–1.00)
GFR calc non Af Amer: 23 mL/min — ABNORMAL LOW (ref >60)
GFR, EST AFRICAN AMERICAN: 26 mL/min — AB (ref >60)
GLUCOSE: 208 mg/dL — AB (ref 70–99)
Potassium: 3.9 mmol/L (ref 3.5–5.1)
Sodium: 140 mmol/L (ref 135–145)

## 2018-04-25 LAB — GLUCOSE, CAPILLARY
GLUCOSE-CAPILLARY: 191 mg/dL — AB (ref 70–99)
GLUCOSE-CAPILLARY: 261 mg/dL — AB (ref 70–99)
GLUCOSE-CAPILLARY: 277 mg/dL — AB (ref 70–99)
Glucose-Capillary: 225 mg/dL — ABNORMAL HIGH (ref 70–99)
Glucose-Capillary: 274 mg/dL — ABNORMAL HIGH (ref 70–99)

## 2018-04-25 LAB — ECHOCARDIOGRAM COMPLETE
HEIGHTINCHES: 66 in
Weight: 3881.6 oz

## 2018-04-25 MED ORDER — HYDRALAZINE HCL 50 MG PO TABS
50.0000 mg | ORAL_TABLET | Freq: Three times a day (TID) | ORAL | Status: DC
  Administered 2018-04-25 – 2018-04-26 (×3): 50 mg via ORAL
  Filled 2018-04-25 (×3): qty 1

## 2018-04-25 MED ORDER — FUROSEMIDE 40 MG PO TABS
40.0000 mg | ORAL_TABLET | Freq: Two times a day (BID) | ORAL | Status: DC
  Administered 2018-04-25 – 2018-04-26 (×2): 40 mg via ORAL
  Filled 2018-04-25 (×2): qty 1

## 2018-04-25 NOTE — Progress Notes (Signed)
eeg completed ° °

## 2018-04-25 NOTE — Progress Notes (Signed)
Patient remains on room air. bipap on standby. Patient has declined to use

## 2018-04-25 NOTE — Plan of Care (Signed)
Encourage patient to get in recliner for meals and while awake. Patient requires 2 assist with walker. Patient does not want to go to rehab after discharge. Continue to educate about self care.

## 2018-04-25 NOTE — Progress Notes (Signed)
SUBJECTIVE: patient is still very lethargic but is able to answer some questions today.   Vitals:   04/24/18 1949 04/24/18 1952 04/25/18 0310 04/25/18 0809  BP: (!) 185/69 (!) 160/101 (!) 169/69 (!) 161/51  Pulse: 66 76 (!) 57 (!) 54  Resp:      Temp:   98.4 F (36.9 C) (!) 97.5 F (36.4 C)  TempSrc:   Oral Oral  SpO2: 91% 93% 96% 97%  Weight:   242 lb 3.2 oz (109.9 kg)   Height:        Intake/Output Summary (Last 24 hours) at 04/25/2018 0811 Last data filed at 04/25/2018 0520 Gross per 24 hour  Intake 360 ml  Output 2600 ml  Net -2240 ml    LABS: Basic Metabolic Panel: Recent Labs    04/24/18 0452 04/25/18 0606  NA 141 140  K 3.5 3.9  CL 100 97*  CO2 34* 35*  GLUCOSE 131* 208*  BUN 22 26*  CREATININE 1.99* 2.03*  CALCIUM 8.9 9.3   Liver Function Tests: Recent Labs    04/22/18 1135  AST 17  ALT 8  ALKPHOS 94  BILITOT 0.9  PROT 6.9  ALBUMIN 3.4*   No results for input(s): LIPASE, AMYLASE in the last 72 hours. CBC: Recent Labs    04/22/18 1135 04/23/18 0458  WBC 6.2 6.0  NEUTROABS 3.7  --   HGB 10.8* 9.4*  HCT 32.6* 28.6*  MCV 84.9 84.3  PLT 296 262   Cardiac Enzymes: Recent Labs    04/22/18 1839 04/22/18 2251 04/23/18 0458  TROPONINI 0.08* 0.09* 0.09*   BNP: Invalid input(s): POCBNP D-Dimer: No results for input(s): DDIMER in the last 72 hours. Hemoglobin A1C: Recent Labs    04/22/18 1839  HGBA1C 7.6*   Fasting Lipid Panel: No results for input(s): CHOL, HDL, LDLCALC, TRIG, CHOLHDL, LDLDIRECT in the last 72 hours. Thyroid Function Tests: Recent Labs    04/22/18 1839  TSH 1.026   Anemia Panel: No results for input(s): VITAMINB12, FOLATE, FERRITIN, TIBC, IRON, RETICCTPCT in the last 72 hours.   PHYSICAL EXAM General: Well developed, well nourished, in no acute distress HEENT:  Normocephalic and atramatic Neck:  No JVD.  Lungs: Clear bilaterally to auscultation and percussion. Heart: HRRR . Normal S1 and S2 without gallops or  murmurs.  Abdomen: Bowel sounds are positive, abdomen soft and non-tender  Msk:  Back normal, normal gait. Normal strength and tone for age. Extremities: No clubbing, cyanosis or edema.   Neuro: Alert and oriented X 3. Psych:  Good affect, responds appropriately  TELEMETRY: sinus rhythm about 65 bpm  ASSESSMENT AND PLAN: status post SVT and syncopal episodes currently in sinus rhythm. Continue metoprolol,unable to add ACE inhibitor because the creatinine is 2.03. Patient also has hypercarbia and other meds metabolic disorders which can explain her lethargy. She is not a candidate for cardiac catheterization.elevated troponin due to demand ischemia secondary to SVT.  Active Problems:   Syncope   Atrial fibrillation with RVR (HCC)    Carolyn Valdez A, MD, [facility] 04/25/2018 8:11 AM

## 2018-04-25 NOTE — Progress Notes (Signed)
Pt refuses Bipap

## 2018-04-25 NOTE — Progress Notes (Signed)
PT Cancellation Note  Patient Details Name: Carolyn Valdez MRN: [id number] DOB: [birth year]   Cancelled Treatment:    Reason Eval/Treat Not Completed: Patient declined, no reason specified.  Pt reports having a tough night and busy day and declining therapy at this time.  Pt educated on importance of physical therapy including strengthening, balance, and functional mobility to improve independence; pt verbalizing understanding but reporting having too much on her mind at the moment to do anything and is planning on discharging to rehab.  Will re-attempt PT treatment at a later date/time.  Leitha Bleak, PT 04/25/18, 5:05 PM [phone]

## 2018-04-25 NOTE — Consult Note (Addendum)
Consultation Note Date: 04/25/2018   Patient Name: Carolyn Valdez  DOB: 1941-10-09  MRN: [id number]  Age / Sex: 77 y.o., female  PCP: Leonel Ramsay, MD Referring Physician: Loletha Grayer, MD  Reason for Consultation: Establishing goals of care  HPI/Patient Profile: Carolyn Valdez  is a 77 y.o. female with multiple medical problems as below presents primarily with a complaint of syncope. The patient states she was trying to get out of her recliner that she sleeps in this morning, when she passed out and came to on the ground.   Clinical Assessment and Goals of Care: Carolyn Valdez answers questions, but falls asleep during responses. Carolyn Valdez states she has been sleeping a lot at home. Called to speak with her sister Carolyn Valdez who is her POA. Carolyn Valdez has hospice in the home for her husband. She states Carolyn Valdez has not been making sense with the things she has stated. She does not want her living alone any longer, she is amenable to SNF.   Per Carolyn Valdez, Carolyn Valdez lives alone. She is widowed as of 15 years ago. She has no children. She went on disability for anxiety in her 94's. She has help coming to the home with cleaning and physical care. She does not have other visitors. She uses a walker. She has lost 40 pounds due to sleeping all the day and night.   Carolyn Valdez states Carolyn Valdez had a similar situation with confusion 2 years ago. She had episodes where she was awake, but did not make sense, she would not speak. She would sleep through meals. She states her sister had seizures in the past. She states during this time, her sister lost weight from not eating; she states Carolyn Valdez stated then she would not want a feeding tube.    Patient would not want a feeding tube. She is a DNR. Her sister states at this time, she would want all other care, as she has not discussed other decisions with her, and would not be willing to  forgo any measure without speaking with her first.    SUMMARY OF RECOMMENDATIONS    Recommend palliative at D/C.   Code Status/Advance Care Planning:  DNR    Symptom Management:   Per primary team.   Palliative Prophylaxis:   Eye Care and Oral Care  Prognosis:   Unable to determine  Discharge Planning: To Be Determined      Primary Diagnoses: Present on Admission: . Syncope . Atrial fibrillation with RVR ([place])   I have reviewed the medical record, interviewed the patient and family, and examined the patient. The following aspects are pertinent.  Past Medical History:  Diagnosis Date  . Anxiety   . Breast cancer ([place]) 1997   right breast mastectomy  . CHF (congestive heart failure) ([place])   . Diabetes mellitus without complication ([place])   . DJD (degenerative joint disease)   . Dysuria   . H/O total knee replacement    right  . HTN (hypertension)   . Hypercholesteremia   .  Kidney stone   . Obesity   . Recurrent urinary tract infection   . SVT (supraventricular tachycardia) ([place])   . Urinary incontinence   . Vaginal atrophy   . Yeast vaginitis    Social History   Socioeconomic History  . Marital status: Widowed    Spouse name: Not on file  . Number of children: Not on file  . Years of education: Not on file  . Highest education level: Not on file  Occupational History  . Not on file  Social Needs  . Financial resource strain: Not on file  . Food insecurity:    Worry: Not on file    Inability: Not on file  . Transportation needs:    Medical: Not on file    Non-medical: Not on file  Tobacco Use  . Smoking status: Former Research scientist (life sciences)  . Smokeless tobacco: Former Systems developer    Quit date: 10/29/1995  Substance and Sexual Activity  . Alcohol use: No    Alcohol/week: 0.0 oz  . Drug use: No  . Sexual activity: Never  Lifestyle  . Physical activity:    Days per week: Not on file    Minutes per session: Not on file  . Stress: Not on file  Relationships    . Social connections:    Talks on phone: Not on file    Gets together: Not on file    Attends religious service: Not on file    Active member of club or organization: Not on file    Attends meetings of clubs or organizations: Not on file    Relationship status: Not on file  Other Topics Concern  . Not on file  Social History Narrative  . Not on file   Family History  Problem Relation Age of Onset  . Lung cancer Father   . Hematuria Mother   . Lung cancer Mother   . Kidney disease Neg Hx   . Bladder Cancer Neg Hx   . Breast cancer Neg Hx    Scheduled Meds: . aspirin EC  81 mg Oral Daily  . citalopram  10 mg Oral Daily  . docusate sodium  100 mg Oral BID  . feeding supplement (GLUCERNA SHAKE)  237 mL Oral BID BM  . furosemide  40 mg Intravenous BID  . guaiFENesin  600 mg Oral BID  . heparin  5,000 Units Subcutaneous Q8H  . hydrALAZINE  50 mg Oral Q8H  . insulin aspart  0-5 Units Subcutaneous QHS  . insulin aspart  0-9 Units Subcutaneous TID WC  . insulin aspart  4 Units Subcutaneous TID WC  . insulin glargine  43 Units Subcutaneous QHS  . iron polysaccharides  150 mg Oral TID  . lamoTRIgine  25 mg Oral Daily  . levETIRAcetam  250 mg Oral BID  . loratadine  10 mg Oral Daily  . metoprolol tartrate  50 mg Oral BID  . nitrofurantoin  100 mg Oral Daily  . OLANZapine zydis  10 mg Oral QHS  . pantoprazole  40 mg Oral BID  . polyethylene glycol  17 g Oral Daily  . potassium chloride  20 mEq Oral BID  . senna  1 tablet Oral Daily  . sodium chloride flush  3 mL Intravenous Q12H  . vitamin E  1,000 Units Oral Daily   Continuous Infusions: PRN Meds:.acetaminophen **OR** acetaminophen, benzonatate, bisacodyl, ipratropium-albuterol, ondansetron **OR** ondansetron (ZOFRAN) IV, ondansetron, traZODone Medications Prior to Admission:  Prior to Admission medications   Medication Sig Start  Date End Date Taking? Authorizing Provider  acetaminophen (TYLENOL) 650 MG CR tablet Take 650  mg by mouth every 6 (six) hours as needed for pain.   Yes [provider]  acidophilus (RISAQUAD) CAPS capsule Take 1 capsule by mouth at bedtime.   Yes [provider]  atorvastatin (LIPITOR) 20 MG tablet Take 20 mg by mouth at bedtime.    Yes [provider]  citalopram (CELEXA) 10 MG tablet Take 1 tablet (10 mg total) by mouth daily. 02/04/17  Yes Wieting, Richard, MD  docusate sodium (COLACE) 100 MG capsule Take 100 mg by mouth 2 (two) times daily.    Yes [provider]  furosemide (LASIX) 40 MG tablet Take 1 tablet (40 mg total) by mouth 2 (two) times daily. 02/04/17  Yes Wieting, Richard, MD  glipiZIDE (GLUCOTROL) 5 MG tablet Take 5 mg by mouth every evening.   Yes [provider]  hydrOXYzine (ATARAX/VISTARIL) 25 MG tablet Take 1 tablet by mouth at bedtime.  07/05/16  Yes [provider]  iron polysaccharides (NIFEREX) 150 MG capsule Take 150 mg by mouth 3 (three) times daily.    Yes [provider]  lamoTRIgine (LAMICTAL) 25 MG tablet Take 25 mg by mouth 2 (two) times daily.    Yes [provider]  levETIRAcetam (KEPPRA) 250 MG tablet Take 1 tablet (250 mg total) by mouth 2 (two) times daily. 10/26/16  Yes Epifanio Lesches, MD  loratadine (CLARITIN) 10 MG tablet Take 10 mg by mouth daily.   Yes [provider]  LORazepam (ATIVAN) 0.5 MG tablet Take 1 tablet (0.5 mg total) by mouth 2 (two) times daily as needed for anxiety. Patient taking differently: Take 0.5 mg by mouth 3 (three) times daily as needed for anxiety.  02/24/17  Yes Fritzi Mandes, MD  meclizine (ANTIVERT) 12.5 MG tablet Take 6.25 mg by mouth 3 (three) times daily as needed for dizziness.   Yes [provider]  Melatonin 3 MG TABS Take 6 mg by mouth at bedtime.   Yes [provider]  metFORMIN (GLUCOPHAGE) 500 MG tablet Take 1,000 mg by mouth 2 (two) times daily with a meal.   Yes [provider]  metoprolol (LOPRESSOR) 50 MG  tablet Take 50 mg by mouth 2 (two) times daily.   Yes [provider]  nitrofurantoin (MACRODANTIN) 100 MG capsule Take 1 capsule (100 mg total) by mouth daily. 02/06/18  Yes MacDiarmid, Nicki Reaper, MD  pantoprazole (PROTONIX) 40 MG tablet Take 40 mg by mouth 2 (two) times daily. 02/09/17  Yes [provider]  senna (SENOKOT) 8.6 MG TABS tablet Take 1 tablet (8.6 mg total) by mouth daily. 09/30/16  Yes Theodoro Grist, MD  tolterodine (DETROL LA) 4 MG 24 hr capsule Take 4 mg by mouth at bedtime.   Yes [provider]  trimethoprim (TRIMPEX) 100 MG tablet Take 50 mg by mouth at bedtime. 02/13/18  Yes [provider]  ciprofloxacin (CIPRO) 250 MG tablet Take 1 tablet (250 mg total) by mouth 2 (two) times daily. Patient not taking: Reported on 04/22/2018 02/06/18   [name], MD  feeding supplement, ENSURE ENLIVE, (ENSURE ENLIVE) LIQD Take 237 mLs by mouth 3 (three) times daily between meals. Patient not taking: Reported on 04/22/2018 10/26/16   Epifanio Lesches, MD  insulin glargine (LANTUS) 100 UNIT/ML injection Inject 0.43 mLs (43 Units total) into the skin at bedtime. Patient not taking: Reported on 04/22/2018 02/04/17   Loletha Grayer, MD  ipratropium-albuterol (DUONEB) 0.5-2.5 (3)  MG/3ML SOLN Take 3 mLs by nebulization 3 (three) times daily. Patient not taking: Reported on 04/22/2018 02/04/17   Loletha Grayer, MD   Allergies  Allergen Reactions  . Sulfa Antibiotics Hives  . Biaxin [Clarithromycin] Hives  . Influenza A (H1n1) Monoval Vac Other (See Comments)    Pt states that she was told by her MD not to get the influenza vaccine.    . Morphine Other (See Comments)    Reaction:  Dizziness and confusion   . Pyridium [Phenazopyridine Hcl] Other (See Comments)    Reaction:  Unknown   . Ceftriaxone Anxiety  . Latex Rash  . Prednisone Rash  . Tape Rash   Review of Systems  Constitutional:       Complains of sleepiness.     Physical Exam  Constitutional:  No distress.  Pulmonary/Chest: Effort normal.  Skin: Skin is warm and dry.    Vital Signs: BP 136/88 (BP Location: Left Arm)   Pulse 71   Temp (!) 97.5 F (36.4 C) (Oral)   Resp 18   Ht 5\' 6"  (1.676 m)   Wt 109.9 kg (242 lb 3.2 oz)   SpO2 95%   BMI 39.09 kg/m  Pain Scale: 0-10   Pain Score: 0-No pain   SpO2: SpO2: 95 % O2 Device:SpO2: 95 % O2 Flow Rate: .O2 Flow Rate (L/min): 0 L/min  IO: Intake/output summary:   Intake/Output Summary (Last 24 hours) at 04/25/2018 1353 Last data filed at 04/25/2018 0520 Gross per 24 hour  Intake -  Output 2600 ml  Net -2600 ml    LBM: Last BM Date: 04/24/18 Baseline Weight: Weight: 117.9 kg (260 lb) Most recent weight: Weight: 109.9 kg (242 lb 3.2 oz)     Palliative Assessment/Data: 20%     Time In: 1:30 Time Out: 2:10 Time Total: 40 min Greater than 50%  of this time was spent counseling and coordinating care related to the above assessment and plan.  Signed by: Asencion Gowda, NP   Please contact Palliative Medicine Team phone at [phone] for questions and concerns.  For individual provider: See Shea Evans

## 2018-04-25 NOTE — Progress Notes (Signed)
Subjective: Patient awake and alert.  Reports she was awake for much of the night but has no new neurological complaints.    Objective: Current vital signs: BP 136/88 (BP Location: Left Arm)   Pulse 71   Temp (!) 97.5 F (36.4 C) (Oral)   Resp 18   Ht 5\' 6"  (1.676 m)   Wt 109.9 kg (242 lb 3.2 oz)   SpO2 95%   BMI 39.09 kg/m  Vital signs in last 24 hours: Temp:  [97.5 F (36.4 C)-98.4 F (36.9 C)] 97.5 F (36.4 C) (07/09 0809) Pulse Rate:  [54-76] 71 (07/09 1024) Resp:  [18] 18 (07/08 1649) BP: (136-185)/(51-101) 136/88 (07/09 1024) SpO2:  [91 %-97 %] 95 % (07/09 1024) Weight:  [109.9 kg (242 lb 3.2 oz)] 109.9 kg (242 lb 3.2 oz) (07/09 0310)  Intake/Output from previous day: 07/08 0701 - 07/09 0700 In: 360 [P.O.:360] Out: 2850 [Urine:2850] Intake/Output this shift: No intake/output data recorded. Nutritional status:  Diet Order           Diet heart healthy/carb modified Room service appropriate? Yes; Fluid consistency: Thin  Diet effective now          Neurologic Exam: Mental Status: Alert, oriented, thought content appropriate.  Speech fluent without evidence of aphasia.  Able to follow 3 step commands without difficulty. Cranial Nerves: II: Discs flat bilaterally; Visual fields grossly normal, pupils equal, round, reactive to light and accommodation III,IV, VI: ptosis not present, extra-ocular motions intact bilaterally V,VII: smile symmetric, facial light touch sensation normal bilaterally VIII: hearing normal bilaterally IX,X: gag reflex present XI: bilateral shoulder shrug XII: midline tongue extension Motor: 5/5 throughout Sensory: Pinprick and light touch intact throughout, bilaterally   Lab Results: Basic Metabolic Panel: Recent Labs  Lab 04/22/18 1135 04/23/18 0458 04/24/18 0452 04/25/18 0606  NA 135 138 141 140  K 3.9 3.8 3.5 3.9  CL 94* 99 100 97*  CO2 30 33* 34* 35*  GLUCOSE 355* 183* 131* 208*  BUN 20 18 22  26*  CREATININE 1.93* 1.91*  1.99* 2.03*  CALCIUM 9.1 8.7* 8.9 9.3    Liver Function Tests: Recent Labs  Lab 04/22/18 1135  AST 17  ALT 8  ALKPHOS 94  BILITOT 0.9  PROT 6.9  ALBUMIN 3.4*   No results for input(s): LIPASE, AMYLASE in the last 168 hours. No results for input(s): AMMONIA in the last 168 hours.  CBC: Recent Labs  Lab 04/22/18 1135 04/23/18 0458  WBC 6.2 6.0  NEUTROABS 3.7  --   HGB 10.8* 9.4*  HCT 32.6* 28.6*  MCV 84.9 84.3  PLT 296 262    Cardiac Enzymes: Recent Labs  Lab 04/22/18 1135 04/22/18 1839 04/22/18 2251 04/23/18 0458  TROPONINI <0.03 0.08* 0.09* 0.09*    Lipid Panel: No results for input(s): CHOL, TRIG, HDL, CHOLHDL, VLDL, LDLCALC in the last 168 hours.  CBG: Recent Labs  Lab 04/24/18 1652 04/24/18 2048 04/25/18 0320 04/25/18 0804 04/25/18 1151  GLUCAP 261* 243* 225* 191* 65*    Microbiology: Results for orders placed or performed during the hospital encounter of 04/22/18  Blood culture (routine x 2)     Status: None (Preliminary result)   Collection Time: 04/22/18 11:35 AM  Result Value Ref Range Status   Specimen Description BLOOD BLOOD LEFT HAND  Final   Special Requests   Final    BOTTLES DRAWN AEROBIC AND ANAEROBIC Blood Culture adequate volume   Culture   Final    NO GROWTH 3 DAYS Performed  at [facility], [address], [place], [place] [address]    Report Status PENDING  Incomplete  Blood culture (routine x 2)     Status: None (Preliminary result)   Collection Time: 04/22/18 11:35 AM  Result Value Ref Range Status   Specimen Description BLOOD LEFT ANTECUBITAL  Final   Special Requests   Final    BOTTLES DRAWN AEROBIC AND ANAEROBIC Blood Culture adequate volume   Culture   Final    NO GROWTH 3 DAYS Performed at [facility], [address], [place], [place] [address]    Report Status PENDING  Incomplete  MRSA PCR Screening     Status: None   Collection Time: 04/22/18  9:15 PM  Result Value Ref Range Status    MRSA by PCR NEGATIVE NEGATIVE Final    Comment:        The GeneXpert MRSA Assay (FDA approved for NASAL specimens only), is one component of a comprehensive MRSA colonization surveillance program. It is not intended to diagnose MRSA infection nor to guide or monitor treatment for MRSA infections. Performed at [facility], [address] [place], [place] [address]     Coagulation Studies: No results for input(s): LABPROT, INR in the last 72 hours.  Imaging: US Venous Img Lower Bilateral  Result Date: 04/24/2018 CLINICAL DATA:  Lower extremity pain and edema EXAM: BILATERAL LOWER EXTREMITY VENOUS DOPPLER ULTRASOUND TECHNIQUE: Gray-scale sonography with graded compression, as well as color Doppler and duplex ultrasound were performed to evaluate the lower extremity deep venous systems from the level of the common femoral vein and including the common femoral, femoral, profunda femoral, popliteal and calf veins including the posterior tibial, peroneal and gastrocnemius veins when visible. The superficial great saphenous vein was also interrogated. Spectral Doppler was utilized to evaluate flow at rest and with distal augmentation maneuvers in the common femoral, femoral and popliteal veins. COMPARISON:  None. FINDINGS: RIGHT LOWER EXTREMITY Common Femoral Vein: No evidence of thrombus. Normal compressibility, respiratory phasicity and response to augmentation. Saphenofemoral Junction: No evidence of thrombus. Normal compressibility and flow on color Doppler imaging. Profunda Femoral Vein: No evidence of thrombus. Normal compressibility and flow on color Doppler imaging. Femoral Vein: No evidence of thrombus. Normal compressibility, respiratory phasicity and response to augmentation. Popliteal Vein: No evidence of thrombus. Normal compressibility, respiratory phasicity and response to augmentation. Calf Veins: Limited assessment of the calf veins. Posterior tibial vein appears patent.  Peroneal vein not visualized. Superficial Great Saphenous Vein: No evidence of thrombus. Normal compressibility. Venous Reflux:  None. Other Findings:  None. LEFT LOWER EXTREMITY Common Femoral Vein: No evidence of thrombus. Normal compressibility, respiratory phasicity and response to augmentation. Saphenofemoral Junction: No evidence of thrombus. Normal compressibility and flow on color Doppler imaging. Profunda Femoral Vein: No evidence of thrombus. Normal compressibility and flow on color Doppler imaging. Femoral Vein: No evidence of thrombus. Normal compressibility, respiratory phasicity and response to augmentation. Popliteal Vein: No evidence of thrombus. Normal compressibility, respiratory phasicity and response to augmentation. Calf Veins: Limited assessment of the calf veins. Posterior tibial vein appears patent. Peroneal vein not visualized. Superficial Great Saphenous Vein: No evidence of thrombus. Normal compressibility. Venous Reflux:  None. Other Findings:  None. IMPRESSION: No significant DVT demonstrated in either extremity. Limited assessment of the calf veins. Electronically Signed   By: Jerilynn Mages.  Shick M.D.   On: 04/24/2018 11:19    Medications:  I have reviewed the patient's current medications. Scheduled: . aspirin EC  81 mg Oral  Daily  . citalopram  10 mg Oral Daily  . docusate sodium  100 mg Oral BID  . feeding supplement (GLUCERNA SHAKE)  237 mL Oral BID BM  . furosemide  40 mg Oral BID  . guaiFENesin  600 mg Oral BID  . heparin  5,000 Units Subcutaneous Q8H  . hydrALAZINE  50 mg Oral Q8H  . insulin aspart  0-5 Units Subcutaneous QHS  . insulin aspart  0-9 Units Subcutaneous TID WC  . insulin aspart  4 Units Subcutaneous TID WC  . insulin glargine  43 Units Subcutaneous QHS  . iron polysaccharides  150 mg Oral TID  . lamoTRIgine  25 mg Oral Daily  . levETIRAcetam  250 mg Oral BID  . loratadine  10 mg Oral Daily  . metoprolol tartrate  50 mg Oral BID  . nitrofurantoin  100 mg  Oral Daily  . OLANZapine zydis  10 mg Oral QHS  . pantoprazole  40 mg Oral BID  . polyethylene glycol  17 g Oral Daily  . potassium chloride  20 mEq Oral BID  . senna  1 tablet Oral Daily  . sodium chloride flush  3 mL Intravenous Q12H  . vitamin E  1,000 Units Oral Daily    Assessment/Plan: No new neurological complaints.  EEG shows no epileptiform activity.  Seems to be near baseline.  Unclear etiology for MS change.   No further neurologic intervention is recommended at this time.  If further questions arise, please call or page at that time.  Thank you for allowing neurology to participate in the care of this patient.    LOS: 2 days   Alexis Goodell, MD Neurology [phone] 04/25/2018  3:01 PM

## 2018-04-25 NOTE — Progress Notes (Signed)
Patient ID: Carolyn Valdez, female   DOB: Aug 18, 1941, 77 y.o.   MRN: [id number]  [facility] PROGRESS NOTE  Carolyn Valdez JKK:[id number] DOB: Nov 12, 1940 DOA: 04/22/2018 PCP: Leonel Ramsay, MD  HPI/Subjective: Patient feeling fatigue still.  Some leg pain.  Still slow with some of her answers.  Knows that she  can hardly stand up.  Agreeable to rehab.  Objective: Vitals:   04/25/18 1017 04/25/18 1024  BP: (!) 157/61 136/88  Pulse: 67 71  Resp:    Temp:    SpO2: 95% 95%    Filed Weights   04/23/18 0341 04/24/18 0456 04/25/18 0310  Weight: 111.8 kg (246 lb 6.4 oz) 110 kg (242 lb 9.6 oz) 109.9 kg (242 lb 3.2 oz)    ROS: Review of Systems  Constitutional: Positive for malaise/fatigue. Negative for chills and fever.  Eyes: Negative for blurred vision.  Respiratory: Negative for cough and shortness of breath.   Cardiovascular: Negative for chest pain.  Gastrointestinal: Negative for abdominal pain, constipation, diarrhea, nausea and vomiting.  Genitourinary: Negative for dysuria.  Musculoskeletal: Positive for myalgias. Negative for joint pain.  Neurological: Negative for dizziness and headaches.   Exam: Physical Exam  HENT:  Nose: No mucosal edema.  Mouth/Throat: No oropharyngeal exudate or posterior oropharyngeal edema.  Eyes: Pupils are equal, round, and reactive to light. Conjunctivae and lids are normal.  Neck: Neck supple. No JVD present. Carotid bruit is not present.  Cardiovascular: Regular rhythm, S1 normal and S2 normal.  Murmur heard.  Systolic murmur is present with a grade of 2/6. Respiratory: She has decreased breath sounds in the right lower field and the left middle field. She has rales in the right lower field and the left lower field.  GI: Soft. Bowel sounds are normal. There is no tenderness.  Musculoskeletal:       Right ankle: She exhibits swelling.       Left ankle: She exhibits swelling.  Neurological: She is alert.  Skin: Skin is warm. Nails show  no clubbing.  Chronic lower extremity swelling.  Psychiatric: Her affect is blunt.      Data Reviewed: Basic Metabolic Panel: Recent Labs  Lab 04/22/18 1135 04/23/18 0458 04/24/18 0452 04/25/18 0606  NA 135 138 141 140  K 3.9 3.8 3.5 3.9  CL 94* 99 100 97*  CO2 30 33* 34* 35*  GLUCOSE 355* 183* 131* 208*  BUN 20 18 22  26*  CREATININE 1.93* 1.91* 1.99* 2.03*  CALCIUM 9.1 8.7* 8.9 9.3   Liver Function Tests: Recent Labs  Lab 04/22/18 1135  AST 17  ALT 8  ALKPHOS 94  BILITOT 0.9  PROT 6.9  ALBUMIN 3.4*   CBC: Recent Labs  Lab 04/22/18 1135 04/23/18 0458  WBC 6.2 6.0  NEUTROABS 3.7  --   HGB 10.8* 9.4*  HCT 32.6* 28.6*  MCV 84.9 84.3  PLT 296 262   Cardiac Enzymes: Recent Labs  Lab 04/22/18 1135 04/22/18 1839 04/22/18 2251 04/23/18 0458  TROPONINI <0.03 0.08* 0.09* 0.09*   BNP (last 3 results) Recent Labs    04/22/18 1135  BNP 335.0*     CBG: Recent Labs  Lab 04/24/18 1652 04/24/18 2048 04/25/18 0320 04/25/18 0804 04/25/18 1151  GLUCAP 261* 243* 225* 191* 261*    Recent Results (from the past 240 hour(s))  Blood culture (routine x 2)     Status: None (Preliminary result)   Collection Time: 04/22/18 11:35 AM  Result Value Ref Range Status   Specimen  Description BLOOD BLOOD LEFT HAND  Final   Special Requests   Final    BOTTLES DRAWN AEROBIC AND ANAEROBIC Blood Culture adequate volume   Culture   Final    NO GROWTH 3 DAYS Performed at [facility], [address] [place], [place] [address]    Report Status PENDING  Incomplete  Blood culture (routine x 2)     Status: None (Preliminary result)   Collection Time: 04/22/18 11:35 AM  Result Value Ref Range Status   Specimen Description BLOOD LEFT ANTECUBITAL  Final   Special Requests   Final    BOTTLES DRAWN AEROBIC AND ANAEROBIC Blood Culture adequate volume   Culture   Final    NO GROWTH 3 DAYS Performed at [facility], [address] [place], [place]  [address]    Report Status PENDING  Incomplete  MRSA PCR Screening     Status: None   Collection Time: 04/22/18  9:15 PM  Result Value Ref Range Status   MRSA by PCR NEGATIVE NEGATIVE Final    Comment:        The GeneXpert MRSA Assay (FDA approved for NASAL specimens only), is one component of a comprehensive MRSA colonization surveillance program. It is not intended to diagnose MRSA infection nor to guide or monitor treatment for MRSA infections. Performed at [facility], [address] [place], [place] [address]      Studies: US Venous Img Lower Bilateral  Result Date: 04/24/2018 CLINICAL DATA:  Lower extremity pain and edema EXAM: BILATERAL LOWER EXTREMITY VENOUS DOPPLER ULTRASOUND TECHNIQUE: Gray-scale sonography with graded compression, as well as color Doppler and duplex ultrasound were performed to evaluate the lower extremity deep venous systems from the level of the common femoral vein and including the common femoral, femoral, profunda femoral, popliteal and calf veins including the posterior tibial, peroneal and gastrocnemius veins when visible. The superficial great saphenous vein was also interrogated. Spectral Doppler was utilized to evaluate flow at rest and with distal augmentation maneuvers in the common femoral, femoral and popliteal veins. COMPARISON:  None. FINDINGS: RIGHT LOWER EXTREMITY Common Femoral Vein: No evidence of thrombus. Normal compressibility, respiratory phasicity and response to augmentation. Saphenofemoral Junction: No evidence of thrombus. Normal compressibility and flow on color Doppler imaging. Profunda Femoral Vein: No evidence of thrombus. Normal compressibility and flow on color Doppler imaging. Femoral Vein: No evidence of thrombus. Normal compressibility, respiratory phasicity and response to augmentation. Popliteal Vein: No evidence of thrombus. Normal compressibility, respiratory phasicity and response to augmentation. Calf Veins: Limited  assessment of the calf veins. Posterior tibial vein appears patent. Peroneal vein not visualized. Superficial Great Saphenous Vein: No evidence of thrombus. Normal compressibility. Venous Reflux:  None. Other Findings:  None. LEFT LOWER EXTREMITY Common Femoral Vein: No evidence of thrombus. Normal compressibility, respiratory phasicity and response to augmentation. Saphenofemoral Junction: No evidence of thrombus. Normal compressibility and flow on color Doppler imaging. Profunda Femoral Vein: No evidence of thrombus. Normal compressibility and flow on color Doppler imaging. Femoral Vein: No evidence of thrombus. Normal compressibility, respiratory phasicity and response to augmentation. Popliteal Vein: No evidence of thrombus. Normal compressibility, respiratory phasicity and response to augmentation. Calf Veins: Limited assessment of the calf veins. Posterior tibial vein appears patent. Peroneal vein not visualized. Superficial Great Saphenous Vein: No evidence of thrombus. Normal compressibility. Venous Reflux:  None. Other Findings:  None. IMPRESSION: No significant DVT demonstrated in either extremity. Limited assessment of the calf veins. Electronically Signed   By: Jerilynn Mages.  Shick M.D.   On: 04/24/2018 11:19    Scheduled Meds: . aspirin EC  81 mg Oral Daily  . citalopram  10 mg Oral Daily  . docusate sodium  100 mg Oral BID  . feeding supplement (GLUCERNA SHAKE)  237 mL Oral BID BM  . furosemide  40 mg Oral BID  . guaiFENesin  600 mg Oral BID  . heparin  5,000 Units Subcutaneous Q8H  . hydrALAZINE  50 mg Oral Q8H  . insulin aspart  0-5 Units Subcutaneous QHS  . insulin aspart  0-9 Units Subcutaneous TID WC  . insulin aspart  4 Units Subcutaneous TID WC  . insulin glargine  43 Units Subcutaneous QHS  . iron polysaccharides  150 mg Oral TID  . lamoTRIgine  25 mg Oral Daily  . levETIRAcetam  250 mg Oral BID  . loratadine  10 mg Oral Daily  . metoprolol tartrate  50 mg Oral BID  . nitrofurantoin   100 mg Oral Daily  . OLANZapine zydis  10 mg Oral QHS  . pantoprazole  40 mg Oral BID  . polyethylene glycol  17 g Oral Daily  . potassium chloride  20 mEq Oral BID  . senna  1 tablet Oral Daily  . sodium chloride flush  3 mL Intravenous Q12H  . vitamin E  1,000 Units Oral Daily    Assessment/Plan:  1. Atrial fibrillation with rapid ventricular response and SVT.  Patient now in normal sinus rhythm.  Continue metoprolol 50 mg twice daily.  Patient on aspirin for anticoagulation. 2. Acute encephalopathy.  Patient still slow with answers.  I stopped Ativan, Vistaril and anything else that can cause altered mental status. 3. Acute kidney injury likely has chronic kidney disease.  Switch Lasix over to p.o..  4. Chronic diastolic congestive heart failure and lower extremity edema/anasarca.  With BUN rising we will switch Lasix over to p.o. 5. History of seizure on lamotrigine and Keppra.  Neurology ordered an EEG. 6. 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 7. Insulin-dependent diabetes mellitus on Lantus and sliding scale  Code Status:     Code Status Orders  (From admission, onward)        Start     Ordered   04/22/18 1702  Do not attempt resuscitation (DNR)  Continuous    Question Answer Comment  In the event of cardiac or respiratory ARREST Do not call a "code blue"   In the event of cardiac or respiratory ARREST Do not perform Intubation, CPR, defibrillation or ACLS   In the event of cardiac or respiratory ARREST Use medication by any route, position, wound care, and other measures to relive pain and suffering. May use oxygen, suction and manual treatment of airway obstruction as needed for comfort.      04/22/18 1701    Code Status History    Date Active Date Inactive Code Status Order ID Comments User Context   02/21/2017 1629 02/24/2017 1747 DNR [id number]  Fritzi Mandes, MD Inpatient   01/31/2017 1617 02/04/2017 1350 DNR [id number]  Max Sane, MD  Inpatient   01/23/2017 1320 01/31/2017 1617 Full Code [id number]  Henreitta Leber, MD Inpatient   10/05/2016 1216 10/26/2016 1740 DNR [id number]  Knox Royalty, NP Inpatient   09/24/2016 1731 10/05/2016 1216 Full Code [id number]  Gladstone Lighter, MD Inpatient   09/01/2016 0217 09/05/2016 1628 Full Code [id number]  [place], [place], DO Inpatient   08/09/2016 2027 08/12/2016 2015 Full Code [id number]  Henreitta Leber, MD Inpatient   09/30/2015 1423 10/07/2015 1737 Full Code [id number]  Loletha Grayer, MD ED   09/05/2015 1756 09/09/2015 1738 DNR [id number]  Loletha Grayer, MD ED    Advance Directive Documentation     Most Recent Value  Type of Advance Directive  Living will, Out of facility DNR (pink MOST or yellow form)  Pre-existing out of facility DNR order (yellow form or pink MOST form)  Yellow form placed in chart (order not valid for inpatient use)  "MOST" Form in Place?  -     Family Communication: Spoke with sister on the phone today. Disposition Plan: Patient agreeable to rehab.  Spoke with Education officer, museum about this.  Consultants:  Cardiology  Neurology  Time spent: 28 minutes.  Savvas Roper [employer]

## 2018-04-25 NOTE — Procedures (Signed)
ELECTROENCEPHALOGRAM REPORT   Patient: Carolyn Valdez       Room #: 259A-AA EEG No. ID: [id number] Age: 77 y.o.        Sex: female Referring Physician: Leslye Peer Report Date:  04/25/2018        Interpreting Physician: Alexis Goodell  History: Carolyn Valdez is an 77 y.o. female admitted with syncope and episodes of altered mental status  Medications:  ASA, Celexa, Lasix,  Apresoline, Insulin, Lamictal, Keppra, Claritin, Lopressor, Macrodantin, Zyprexa, Protonix, Miralax, K-dur, Senokot, Vitamin E  Conditions of Recording:  This is a 16 channel EEG carried out with the patient in the awake and drowsy states.  Description:  The waking background activity consists of a low voltage, symmetrical, fairly well organized, 7-8 Hz theta activity, seen from the parieto-occipital and posterior temporal regions.  Low voltage fast activity, poorly organized, is seen anteriorly and is at times superimposed on more posterior regions.  A mixture of theta and alpha rhythms are seen from the central and temporal regions. The patient drowses frequently with slowing to irregular, low voltage theta and beta activity at higher amplitudes.   Stage II sleep is not obtained. No epileptiform activity is noted.   Hyperventilation was not performed.  Intermittent photic stimulation was performed but failed to illicit any change in the tracing.   IMPRESSION: This is an abnormal EEG secondary to posterior background slowing.  This finding may be seen with a diffuse gray matter disturbance that is etiologically nonspecific, but may include a dementia, among other possibilities.  No epileptiform activity is noted.     Alexis Goodell, MD Neurology [phone] 04/25/2018, 4:17 PM

## 2018-04-25 NOTE — Clinical Social Work Note (Signed)
CSW presented bed offers to patient and her sister and she chose [facility].  CSW contacted [facility], and they will begin insurance authorization.  CSW to continue to follow patient's progress throughout discharge planning.  Jones Broom. [place], MSW, [place]  04/25/2018 5:35 PM

## 2018-04-26 LAB — GLUCOSE, CAPILLARY
GLUCOSE-CAPILLARY: 306 mg/dL — AB (ref 70–99)
Glucose-Capillary: 196 mg/dL — ABNORMAL HIGH (ref 70–99)

## 2018-04-26 MED ORDER — ASPIRIN 81 MG PO TBEC: 81.0000 mg | DELAYED_RELEASE_TABLET | Freq: Every day | ORAL | 0 refills | Status: DC

## 2018-04-26 MED ORDER — ACETAMINOPHEN 325 MG PO TABS: 650.0000 mg | ORAL_TABLET | Freq: Four times a day (QID) | ORAL | Status: AC | PRN

## 2018-04-26 MED ORDER — OLANZAPINE 10 MG PO TBDP: 10.0000 mg | ORAL_TABLET | Freq: Every day | ORAL | 0 refills | Status: DC

## 2018-04-26 MED ORDER — POTASSIUM CHLORIDE CRYS ER 20 MEQ PO TBCR: 20.0000 meq | EXTENDED_RELEASE_TABLET | Freq: Two times a day (BID) | ORAL | 0 refills | Status: DC

## 2018-04-26 MED ORDER — POLYETHYLENE GLYCOL 3350 17 G PO PACK: 17.0000 g | PACK | Freq: Every day | ORAL | 0 refills | Status: AC

## 2018-04-26 MED ORDER — HYDRALAZINE HCL 50 MG PO TABS: 50.0000 mg | ORAL_TABLET | Freq: Three times a day (TID) | ORAL | 0 refills | Status: DC

## 2018-04-26 MED ORDER — LAMOTRIGINE 25 MG PO TABS: 25.0000 mg | ORAL_TABLET | Freq: Every day | ORAL | 0 refills | Status: AC

## 2018-04-26 MED ORDER — INSULIN ASPART 100 UNIT/ML ~~LOC~~ SOLN: 4.0000 [IU] | Freq: Three times a day (TID) | SUBCUTANEOUS | 0 refills | Status: DC

## 2018-04-26 NOTE — Clinical Social Work Note (Signed)
Patient to be d/c'ed today to [facility] room 71.  Patient and family agreeable to plans will transport via ems RN to call report [phone].  CSW updated patient's sister Merrily Pew, [phone].  Evette Cristal, MSW, [place]

## 2018-04-26 NOTE — Discharge Summary (Signed)
[facility] NAME: Carolyn Valdez    MR#:  [id number]  DATE OF BIRTH:  03/24/1941  DATE OF ADMISSION:  04/22/2018 ADMITTING PHYSICIAN: Max Sane, MD  DATE OF DISCHARGE: 04/26/2018  PRIMARY CARE PHYSICIAN: Leonel Ramsay, MD    ADMISSION DIAGNOSIS:  Syncope [R55] Atrial fibrillation with rapid ventricular response ([place]) [I48.91] Syncope, unspecified syncope type [R55]  DISCHARGE DIAGNOSIS:  Active Problems:   Syncope   Atrial fibrillation with RVR ([place])   SECONDARY DIAGNOSIS:   Past Medical History:  Diagnosis Date  . Anxiety   . Breast cancer ([place]) 1997   right breast mastectomy  . CHF (congestive heart failure) ([place])   . Diabetes mellitus without complication ([place])   . DJD (degenerative joint disease)   . Dysuria   . H/O total knee replacement    right  . HTN (hypertension)   . Hypercholesteremia   . Kidney stone   . Obesity   . Recurrent urinary tract infection   . SVT (supraventricular tachycardia) ([place])   . Urinary incontinence   . Vaginal atrophy   . Yeast vaginitis     HOSPITAL COURSE:   1.  Atrial fibrillation with rapid ventricular response and episodes of SVT.  Patient now in normal sinus rhythm.  Continue metoprolol 50 mg p.o. twice a day.  Aspirin only for anticoagulation. 2.  Acute encephalopathy.  Patient slow with answers.  I stop Ativan, Vistaril and all things that can cause altered mental status.  Patient likely has an underlying dementia.  EEG negative for seizure but did show slowing. 3.  Chronic kidney disease stage IV. Continue to monitor with diuresis and potassium supplementation.  Recommend checking a BMP weekly. 4.  Chronic diastolic congestive heart failure and lower extremity edema and anasarca.  The patient was diuresed with IV Lasix.  Since the BUN started rising we switch the Lasix back over to her usual p.o. doses.  I do not think she takes her Lasix at home because I do not think she  can get to the bathroom in time.  Her legs are tender.  Ultrasound of the lower extremities was negative for DVT.  Tylenol as needed pain 5.  History of seizure on lamotrigine and Keppra.  Neurology ordered an EEG which was negative for seizure.  Only showed slowing. 6.  History of depression and catatonia in the past.  She is on Zyprexa and Celexa 7.  Insulin-dependent diabetes mellitus on Lantus and short acting insulin prior to meals.  Check fingersticks q. before meals and nightly.  Titrate Lantus as needed. 8.  Essential hypertension.  Hydralazine added to her metoprolol.  I do not mind her blood pressure being on the higher side because when she stands up I think it will probably drop down.  Unable to really check orthostatic vital signs because she is unable to stand up very well. 9.  I do not think this patient is a very good candidate to live home alone.  Spoke with sister about this.   DISCHARGE CONDITIONS:   Fair  CONSULTS OBTAINED:  Treatment Team:  Dionisio David, MD Leotis Pain, MD  DRUG ALLERGIES:   Allergies  Allergen Reactions  . Sulfa Antibiotics Hives  . Biaxin [Clarithromycin] Hives  . Influenza A (H1n1) [place] Vac Other (See Comments)    Pt states that she was told by her MD not to get the influenza vaccine.    . Morphine Other (See Comments)  Reaction:  Dizziness and confusion   . Pyridium [Phenazopyridine Hcl] Other (See Comments)    Reaction:  Unknown   . Ceftriaxone Anxiety  . Latex Rash  . Prednisone Rash  . Tape Rash    DISCHARGE MEDICATIONS:   Allergies as of 04/26/2018      Reactions   Sulfa Antibiotics Hives   Biaxin [clarithromycin] Hives   Influenza A (h1n1) [place] Vac Other (See Comments)   Pt states that she was told by her MD not to get the influenza vaccine.     Morphine Other (See Comments)   Reaction:  Dizziness and confusion    Pyridium [phenazopyridine Hcl] Other (See Comments)   Reaction:  Unknown    Ceftriaxone Anxiety    Latex Rash   Prednisone Rash   Tape Rash      Medication List    STOP taking these medications   atorvastatin 20 MG tablet Commonly known as:  LIPITOR   ciprofloxacin 250 MG tablet Commonly known as:  CIPRO   glipiZIDE 5 MG tablet Commonly known as:  GLUCOTROL   hydrOXYzine 25 MG tablet Commonly known as:  ATARAX/VISTARIL   LORazepam 0.5 MG tablet Commonly known as:  ATIVAN   meclizine 12.5 MG tablet Commonly known as:  ANTIVERT   metFORMIN 500 MG tablet Commonly known as:  GLUCOPHAGE   tolterodine 4 MG 24 hr capsule Commonly known as:  DETROL LA   trimethoprim 100 MG tablet Commonly known as:  TRIMPEX     TAKE these medications   acetaminophen 650 MG CR tablet Commonly known as:  TYLENOL Take 650 mg by mouth every 6 (six) hours as needed for pain. What changed:  Another medication with the same name was added. Make sure you understand how and when to take each.   acetaminophen 325 MG tablet Commonly known as:  TYLENOL Take 2 tablets (650 mg total) by mouth every 6 (six) hours as needed for mild pain (or Fever >/= 101). What changed:  You were already taking a medication with the same name, and this prescription was added. Make sure you understand how and when to take each.   acidophilus Caps capsule Take 1 capsule by mouth at bedtime.   aspirin 81 MG EC tablet Take 1 tablet (81 mg total) by mouth daily.   citalopram 10 MG tablet Commonly known as:  CELEXA Take 1 tablet (10 mg total) by mouth daily.   docusate sodium 100 MG capsule Commonly known as:  COLACE Take 100 mg by mouth 2 (two) times daily.   feeding supplement (ENSURE ENLIVE) Liqd Take 237 mLs by mouth 3 (three) times daily between meals.   furosemide 40 MG tablet Commonly known as:  LASIX Take 1 tablet (40 mg total) by mouth 2 (two) times daily.   hydrALAZINE 50 MG tablet Commonly known as:  APRESOLINE Take 1 tablet (50 mg total) by mouth every 8 (eight) hours.   insulin aspart 100  UNIT/ML injection Commonly known as:  novoLOG Inject 4 Units into the skin 3 (three) times daily with meals.   insulin glargine 100 UNIT/ML injection Commonly known as:  LANTUS Inject 0.43 mLs (43 Units total) into the skin at bedtime.   ipratropium-albuterol 0.5-2.5 (3) MG/3ML Soln Commonly known as:  DUONEB Take 3 mLs by nebulization 3 (three) times daily.   iron polysaccharides 150 MG capsule Commonly known as:  NIFEREX Take 150 mg by mouth 3 (three) times daily.   lamoTRIgine 25 MG tablet Commonly known as:  LAMICTAL Take 1 tablet (25 mg total) by mouth daily. What changed:  when to take this   levETIRAcetam 250 MG tablet Commonly known as:  KEPPRA Take 1 tablet (250 mg total) by mouth 2 (two) times daily.   loratadine 10 MG tablet Commonly known as:  CLARITIN Take 10 mg by mouth daily.   Melatonin 3 MG Tabs Take 6 mg by mouth at bedtime.   metoprolol tartrate 50 MG tablet Commonly known as:  LOPRESSOR Take 50 mg by mouth 2 (two) times daily.   nitrofurantoin 100 MG capsule Commonly known as:  MACRODANTIN Take 1 capsule (100 mg total) by mouth daily.   OLANZapine zydis 10 MG disintegrating tablet Commonly known as:  ZYPREXA Take 1 tablet (10 mg total) by mouth at bedtime.   pantoprazole 40 MG tablet Commonly known as:  PROTONIX Take 40 mg by mouth 2 (two) times daily.   polyethylene glycol packet Commonly known as:  MIRALAX / GLYCOLAX Take 17 g by mouth daily.   potassium chloride SA 20 MEQ tablet Commonly known as:  K-DUR,KLOR-CON Take 1 tablet (20 mEq total) by mouth 2 (two) times daily.   senna 8.6 MG Tabs tablet Commonly known as:  SENOKOT Take 1 tablet (8.6 mg total) by mouth daily.        DISCHARGE INSTRUCTIONS:   Follow-up Dr. at rehab 1 day  If you experience worsening of your admission symptoms, develop shortness of breath, life threatening emergency, suicidal or homicidal thoughts you must seek medical attention immediately by calling  911 or calling your MD immediately  if symptoms less severe.  You Must read complete instructions/literature along with all the possible adverse reactions/side effects for all the Medicines you take and that have been prescribed to you. Take any new Medicines after you have completely understood and accept all the possible adverse reactions/side effects.   Please note  You were cared for by a hospitalist during your hospital stay. If you have any questions about your discharge medications or the care you received while you were in the hospital after you are discharged, you can call the unit and asked to speak with the hospitalist on call if the hospitalist that took care of you is not available. Once you are discharged, your primary care physician will handle any further medical issues. Please note that NO REFILLS for any discharge medications will be authorized once you are discharged, as it is imperative that you return to your primary care physician (or establish a relationship with a primary care physician if you do not have one) for your aftercare needs so that they can reassess your need for medications and monitor your lab values.    Today   CHIEF COMPLAINT:   Chief Complaint  Patient presents with  . Headache    HISTORY OF PRESENT ILLNESS:  Carolyn Valdez  is a 77 y.o. female came in with syncope.   VITAL SIGNS:  Blood pressure (!) 175/54, pulse (!) 51, temperature 98.4 F (36.9 C), temperature source Oral, resp. rate 18, height 5\' 6"  (1.676 m), weight 108.6 kg (239 lb 8 oz), SpO2 90 %.    PHYSICAL EXAMINATION:  GENERAL:  77 y.o.-year-old patient lying in the bed with no acute distress.  EYES: Pupils equal, round, reactive to light and accommodation. No scleral icterus. Extraocular muscles intact.  HEENT: Head atraumatic, normocephalic. Oropharynx and nasopharynx clear.  NECK:  Supple, no jugular venous distention. No thyroid enlargement, no tenderness.  LUNGS: Normal breath  sounds bilaterally,  no wheezing, rales,rhonchi or crepitation. No use of accessory muscles of respiration.  CARDIOVASCULAR: S1, S2 normal. No murmurs, rubs, or gallops.  ABDOMEN: Soft, non-tender, non-distended. Bowel sounds present. No organomegaly or mass.  EXTREMITIES: No pedal edema, cyanosis, or clubbing.  NEUROLOGIC: Cranial nerves II through XII are intact. Muscle strength 5/5 in all extremities. Sensation intact. Gait not checked.  PSYCHIATRIC: The patient is alert and oriented x 3.  SKIN: No obvious rash, lesion, or ulcer.   DATA REVIEW:   CBC Recent Labs  Lab 04/23/18 0458  WBC 6.0  HGB 9.4*  HCT 28.6*  PLT 262    Chemistries  Recent Labs  Lab 04/22/18 1135  04/25/18 0606  NA 135   < > 140  K 3.9   < > 3.9  CL 94*   < > 97*  CO2 30   < > 35*  GLUCOSE 355*   < > 208*  BUN 20   < > 26*  CREATININE 1.93*   < > 2.03*  CALCIUM 9.1   < > 9.3  AST 17  --   --   ALT 8  --   --   ALKPHOS 94  --   --   BILITOT 0.9  --   --    < > = values in this interval not displayed.    Cardiac Enzymes Recent Labs  Lab 04/23/18 0458  TROPONINI 0.09*    Microbiology Results  Results for orders placed or performed during the hospital encounter of 04/22/18  Blood culture (routine x 2)     Status: None (Preliminary result)   Collection Time: 04/22/18 11:35 AM  Result Value Ref Range Status   Specimen Description BLOOD BLOOD LEFT HAND  Final   Special Requests   Final    BOTTLES DRAWN AEROBIC AND ANAEROBIC Blood Culture adequate volume   Culture   Final    NO GROWTH 3 DAYS Performed at [facility], [address], [place], [place] [address]    Report Status PENDING  Incomplete  Blood culture (routine x 2)     Status: None (Preliminary result)   Collection Time: 04/22/18 11:35 AM  Result Value Ref Range Status   Specimen Description BLOOD LEFT ANTECUBITAL  Final   Special Requests   Final    BOTTLES DRAWN AEROBIC AND ANAEROBIC Blood Culture adequate volume    Culture   Final    NO GROWTH 3 DAYS Performed at [facility], [address], [place], [place] [address]    Report Status PENDING  Incomplete  MRSA PCR Screening     Status: None   Collection Time: 04/22/18  9:15 PM  Result Value Ref Range Status   MRSA by PCR NEGATIVE NEGATIVE Final    Comment:        The GeneXpert MRSA Assay (FDA approved for NASAL specimens only), is one component of a comprehensive MRSA colonization surveillance program. It is not intended to diagnose MRSA infection nor to guide or monitor treatment for MRSA infections. Performed at [facility], [address] [place], [place] [address]     RADIOLOGY:  US Venous Img Lower Bilateral  Result Date: 04/24/2018 CLINICAL DATA:  Lower extremity pain and edema EXAM: BILATERAL LOWER EXTREMITY VENOUS DOPPLER ULTRASOUND TECHNIQUE: Gray-scale sonography with graded compression, as well as color Doppler and duplex ultrasound were performed to evaluate the lower extremity deep venous systems from the level of the common femoral vein and including the common femoral, femoral,  profunda femoral, popliteal and calf veins including the posterior tibial, peroneal and gastrocnemius veins when visible. The superficial great saphenous vein was also interrogated. Spectral Doppler was utilized to evaluate flow at rest and with distal augmentation maneuvers in the common femoral, femoral and popliteal veins. COMPARISON:  None. FINDINGS: RIGHT LOWER EXTREMITY Common Femoral Vein: No evidence of thrombus. Normal compressibility, respiratory phasicity and response to augmentation. Saphenofemoral Junction: No evidence of thrombus. Normal compressibility and flow on color Doppler imaging. Profunda Femoral Vein: No evidence of thrombus. Normal compressibility and flow on color Doppler imaging. Femoral Vein: No evidence of thrombus. Normal compressibility, respiratory phasicity and response to augmentation. Popliteal Vein: No  evidence of thrombus. Normal compressibility, respiratory phasicity and response to augmentation. Calf Veins: Limited assessment of the calf veins. Posterior tibial vein appears patent. Peroneal vein not visualized. Superficial Great Saphenous Vein: No evidence of thrombus. Normal compressibility. Venous Reflux:  None. Other Findings:  None. LEFT LOWER EXTREMITY Common Femoral Vein: No evidence of thrombus. Normal compressibility, respiratory phasicity and response to augmentation. Saphenofemoral Junction: No evidence of thrombus. Normal compressibility and flow on color Doppler imaging. Profunda Femoral Vein: No evidence of thrombus. Normal compressibility and flow on color Doppler imaging. Femoral Vein: No evidence of thrombus. Normal compressibility, respiratory phasicity and response to augmentation. Popliteal Vein: No evidence of thrombus. Normal compressibility, respiratory phasicity and response to augmentation. Calf Veins: Limited assessment of the calf veins. Posterior tibial vein appears patent. Peroneal vein not visualized. Superficial Great Saphenous Vein: No evidence of thrombus. Normal compressibility. Venous Reflux:  None. Other Findings:  None. IMPRESSION: No significant DVT demonstrated in either extremity. Limited assessment of the calf veins. Electronically Signed   By: Jerilynn Mages.  Shick M.D.   On: 04/24/2018 11:19     Management plans discussed with the patient, family and they are in agreement.  CODE STATUS:     Code Status Orders  (From admission, onward)        Start     Ordered   04/22/18 1702  Do not attempt resuscitation (DNR)  Continuous    Question Answer Comment  In the event of cardiac or respiratory ARREST Do not call a "code blue"   In the event of cardiac or respiratory ARREST Do not perform Intubation, CPR, defibrillation or ACLS   In the event of cardiac or respiratory ARREST Use medication by any route, position, wound care, and other measures to relive pain and  suffering. May use oxygen, suction and manual treatment of airway obstruction as needed for comfort.      04/22/18 1701    Code Status History    Date Active Date Inactive Code Status Order ID Comments User Context   02/21/2017 1629 02/24/2017 1747 DNR [id number]  Fritzi Mandes, MD Inpatient   01/31/2017 1617 02/04/2017 1350 DNR [id number]  Max Sane, MD Inpatient   01/23/2017 1320 01/31/2017 1617 Full Code [id number]  Henreitta Leber, MD Inpatient   10/05/2016 1216 10/26/2016 1740 DNR [id number]  Knox Royalty, NP Inpatient   09/24/2016 1731 10/05/2016 1216 Full Code [id number]  Gladstone Lighter, MD Inpatient   09/01/2016 0217 09/05/2016 1628 Full Code [id number]  Hugelmeyer, [place], DO Inpatient   08/09/2016 2027 08/12/2016 2015 Full Code [id number]  Henreitta Leber, MD Inpatient   09/30/2015 1423 10/07/2015 1737 Full Code [id number]  Loletha Grayer, MD ED   09/05/2015 1756 09/09/2015 1738 DNR [id number]  Loletha Grayer, MD ED    Advance Directive Documentation     Most Recent Value  Type of Advance Directive  Living will, Out of facility DNR (pink MOST or yellow form)  Pre-existing out of facility DNR order (yellow form or pink MOST form)  Yellow form placed in chart (order not valid for inpatient use)  "MOST" Form in Place?  -      TOTAL TIME TAKING CARE OF THIS PATIENT: 35 minutes.    Loletha Grayer M.D on 04/26/2018 at 10:02 AM  Between 7am to 6pm - Pager - [phone]  After 6pm go to [email or web address] - password [email or web address]  [employer] Office  [phone]  CC: Primary care physician; Leonel Ramsay, MD

## 2018-04-26 NOTE — Plan of Care (Signed)
Patient ambulated from bed to [facility] with walker with moderate assist from staff. Encourage patient to walk in room and to recliner. Patient needs to be out of bed while awake.

## 2018-04-26 NOTE — Progress Notes (Signed)
SUBJECTIVE: Resting comfortably.   Vitals:   04/25/18 1945 04/25/18 1948 04/26/18 0533 04/26/18 0811  BP: (!) 166/110 119/74 (!) 164/73 (!) 175/54  Pulse: 70 77 68 (!) 51  Resp:   17 18  Temp:   98.2 F (36.8 C) 98.4 F (36.9 C)  TempSrc:   Oral Oral  SpO2: 92% 95% 97% 90%  Weight:   239 lb 8 oz (108.6 kg)   Height:        Intake/Output Summary (Last 24 hours) at 04/26/2018 0906 Last data filed at 04/26/2018 0554 Gross per 24 hour  Intake -  Output 2650 ml  Net -2650 ml    LABS: Basic Metabolic Panel: Recent Labs    04/24/18 0452 04/25/18 0606  NA 141 140  K 3.5 3.9  CL 100 97*  CO2 34* 35*  GLUCOSE 131* 208*  BUN 22 26*  CREATININE 1.99* 2.03*  CALCIUM 8.9 9.3   Liver Function Tests: No results for input(s): AST, ALT, ALKPHOS, BILITOT, PROT, ALBUMIN in the last 72 hours. No results for input(s): LIPASE, AMYLASE in the last 72 hours. CBC: No results for input(s): WBC, NEUTROABS, HGB, HCT, MCV, PLT in the last 72 hours. Cardiac Enzymes: No results for input(s): CKTOTAL, CKMB, CKMBINDEX, TROPONINI in the last 72 hours. BNP: Invalid input(s): POCBNP D-Dimer: No results for input(s): DDIMER in the last 72 hours. Hemoglobin A1C: No results for input(s): HGBA1C in the last 72 hours. Fasting Lipid Panel: No results for input(s): CHOL, HDL, LDLCALC, TRIG, CHOLHDL, LDLDIRECT in the last 72 hours. Thyroid Function Tests: No results for input(s): TSH, T4TOTAL, T3FREE, THYROIDAB in the last 72 hours.  Invalid input(s): FREET3 Anemia Panel: No results for input(s): VITAMINB12, FOLATE, FERRITIN, TIBC, IRON, RETICCTPCT in the last 72 hours.   PHYSICAL EXAM General: Well developed, well nourished, in no acute distress HEENT:  Normocephalic and atramatic Neck:  No JVD.  Lungs: Clear bilaterally to auscultation and percussion. Heart: HRRR . Normal S1 and S2 without gallops or murmurs.  Abdomen: Bowel sounds are positive, abdomen soft and non-tender  Msk:  Back  normal, normal gait. Normal strength and tone for age. Extremities: No clubbing, cyanosis or edema.     TELEMETRY: Sinus bradycardia 56bpm  ASSESSMENT AND PLAN: S/P SVT and syncopal episodes,  with elevated troponin secondary to demand ischemia. Now in sinus rhythm. Blood pressures volatile but avoiding ACE/ARB due to creatine 2.03.   Echo 04/24/18: The right ventricular systolic pressure was increased consistent   with mild pulmonary hypertension. Normal LVEF and normal Wall motion and LVH, with MAC and mild MR.  Continue current medical treatment. Not a candidate for cardiac cath.   Active Problems:   Syncope   Atrial fibrillation with RVR ([place])    Jake Bathe, NP-C 04/26/2018 9:06 AM Cell: [phone]

## 2018-04-26 NOTE — Clinical Social Work Placement (Signed)
   CLINICAL SOCIAL WORK PLACEMENT  NOTE  Date:  04/26/2018  Patient Details  Name: Carolyn Valdez MRN: [id number] Date of Birth: [birth year]  Clinical Social Work is seeking post-discharge placement for this patient at the [place] level of care (*CSW will initial, date and re-position this form in  chart as items are completed):  Yes   Patient/family provided with [facility] Work Department's list of facilities offering this level of care within the geographic area requested by the patient (or if unable, by the patient's family).  Yes   Patient/family informed of their freedom to choose among providers that offer the needed level of care, that participate in Medicare, Medicaid or managed care program needed by the patient, have an available bed and are willing to accept the patient.  Yes   Patient/family informed of [facility]'s ownership interest in [facility] and [facility], as well as of the fact that they are under no obligation to receive care at these facilities.  PASRR submitted to EDS on 04/24/18     PASRR number received on       Existing PASRR number confirmed on 04/24/18     FL2 transmitted to all facilities in geographic area requested by pt/family on 04/24/18     FL2 transmitted to all facilities within larger geographic area on       Patient informed that his/her managed care company has contracts with or will negotiate with certain facilities, including the following:        Yes   Patient/family informed of bed offers received.  Patient chooses bed at [facility]     Physician recommends and patient chooses bed at      Patient to be transferred to [facility] on 04/26/18.  Patient to be transferred to facility by [facility]     Patient family notified on 04/26/18 of transfer.  Name of family member notified:  Patient's sister Priscella     PHYSICIAN Please sign FL2, Please sign DNR, Please  prepare prescriptions, Please prepare priority discharge summary, including medications     Additional Comment:    _______________________________________________ Ross Ludwig, LCSWA 04/26/2018, 12:57 PM

## 2018-04-26 NOTE — Care Management Important Message (Signed)
Copy of signed IM left with patient in room.  

## 2018-04-26 NOTE — Progress Notes (Signed)
Discharge report called to [facility] / iv and tele removed/ EMS called to transport

## 2018-04-26 NOTE — Clinical Social Work Note (Signed)
CSW received phone call from [facility] that they have received insurance authorization for patient to go to SNF.  CSW to continue to follow patient's progress throughout discharge planning.  Jones Broom. [place], MSW, [place]  04/26/2018 8:39 AM

## 2018-04-26 NOTE — Progress Notes (Signed)
Night RN reports (+) orthostatic VS/ Dr. Earleen Newport made aware

## 2018-04-27 LAB — CULTURE, BLOOD (ROUTINE X 2)
CULTURE: NO GROWTH
CULTURE: NO GROWTH
SPECIAL REQUESTS: ADEQUATE
Special Requests: ADEQUATE

## 2018-05-08 ENCOUNTER — Ambulatory Visit: Payer: Self-pay

## 2018-07-04 ENCOUNTER — Encounter: Payer: Self-pay | Admitting: Emergency Medicine

## 2018-07-04 ENCOUNTER — Emergency Department: Payer: Medicare Other

## 2018-07-04 ENCOUNTER — Other Ambulatory Visit: Payer: Self-pay

## 2018-07-04 ENCOUNTER — Inpatient Hospital Stay
Admission: EM | Admit: 2018-07-04 | Discharge: 2018-07-14 | DRG: 682 | Disposition: A | Discharge status: Hospice | Payer: Medicare Other | Attending: Internal Medicine | Admitting: Internal Medicine

## 2018-07-04 DIAGNOSIS — E875 Hyperkalemia: Secondary | ICD-10-CM | POA: Diagnosis present

## 2018-07-04 DIAGNOSIS — Z888 Allergy status to other drugs, medicaments and biological substances status: Secondary | ICD-10-CM

## 2018-07-04 DIAGNOSIS — E785 Hyperlipidemia, unspecified: Secondary | ICD-10-CM | POA: Diagnosis present

## 2018-07-04 DIAGNOSIS — I5033 Acute on chronic diastolic (congestive) heart failure: Secondary | ICD-10-CM | POA: Diagnosis present

## 2018-07-04 DIAGNOSIS — N390 Urinary tract infection, site not specified: Secondary | ICD-10-CM | POA: Diagnosis present

## 2018-07-04 DIAGNOSIS — R32 Unspecified urinary incontinence: Secondary | ICD-10-CM | POA: Diagnosis present

## 2018-07-04 DIAGNOSIS — E8770 Fluid overload, unspecified: Secondary | ICD-10-CM

## 2018-07-04 DIAGNOSIS — Z87440 Personal history of urinary (tract) infections: Secondary | ICD-10-CM

## 2018-07-04 DIAGNOSIS — E860 Dehydration: Secondary | ICD-10-CM | POA: Diagnosis present

## 2018-07-04 DIAGNOSIS — R001 Bradycardia, unspecified: Secondary | ICD-10-CM | POA: Diagnosis present

## 2018-07-04 DIAGNOSIS — Z853 Personal history of malignant neoplasm of breast: Secondary | ICD-10-CM

## 2018-07-04 DIAGNOSIS — Z515 Encounter for palliative care: Secondary | ICD-10-CM | POA: Diagnosis present

## 2018-07-04 DIAGNOSIS — Z9011 Acquired absence of right breast and nipple: Secondary | ICD-10-CM

## 2018-07-04 DIAGNOSIS — Z96651 Presence of right artificial knee joint: Secondary | ICD-10-CM | POA: Diagnosis present

## 2018-07-04 DIAGNOSIS — Z87442 Personal history of urinary calculi: Secondary | ICD-10-CM

## 2018-07-04 DIAGNOSIS — E876 Hypokalemia: Secondary | ICD-10-CM

## 2018-07-04 DIAGNOSIS — Z885 Allergy status to narcotic agent status: Secondary | ICD-10-CM

## 2018-07-04 DIAGNOSIS — J9601 Acute respiratory failure with hypoxia: Secondary | ICD-10-CM | POA: Diagnosis present

## 2018-07-04 DIAGNOSIS — Z87891 Personal history of nicotine dependence: Secondary | ICD-10-CM

## 2018-07-04 DIAGNOSIS — E872 Acidosis: Secondary | ICD-10-CM | POA: Diagnosis present

## 2018-07-04 DIAGNOSIS — Z887 Allergy status to serum and vaccine status: Secondary | ICD-10-CM

## 2018-07-04 DIAGNOSIS — R809 Proteinuria, unspecified: Secondary | ICD-10-CM | POA: Diagnosis present

## 2018-07-04 DIAGNOSIS — Z66 Do not resuscitate: Secondary | ICD-10-CM | POA: Diagnosis present

## 2018-07-04 DIAGNOSIS — G4733 Obstructive sleep apnea (adult) (pediatric): Secondary | ICD-10-CM | POA: Diagnosis present

## 2018-07-04 DIAGNOSIS — Z794 Long term (current) use of insulin: Secondary | ICD-10-CM

## 2018-07-04 DIAGNOSIS — E1165 Type 2 diabetes mellitus with hyperglycemia: Secondary | ICD-10-CM | POA: Diagnosis present

## 2018-07-04 DIAGNOSIS — R0902 Hypoxemia: Secondary | ICD-10-CM | POA: Diagnosis not present

## 2018-07-04 DIAGNOSIS — Z6841 Body Mass Index (BMI) 40.0 and over, adult: Secondary | ICD-10-CM

## 2018-07-04 DIAGNOSIS — E871 Hypo-osmolality and hyponatremia: Secondary | ICD-10-CM | POA: Diagnosis present

## 2018-07-04 DIAGNOSIS — E7800 Pure hypercholesterolemia, unspecified: Secondary | ICD-10-CM | POA: Diagnosis present

## 2018-07-04 DIAGNOSIS — Z1623 Resistance to quinolones and fluoroquinolones: Secondary | ICD-10-CM | POA: Diagnosis not present

## 2018-07-04 DIAGNOSIS — E1122 Type 2 diabetes mellitus with diabetic chronic kidney disease: Secondary | ICD-10-CM | POA: Diagnosis present

## 2018-07-04 DIAGNOSIS — N183 Chronic kidney disease, stage 3 (moderate): Secondary | ICD-10-CM | POA: Diagnosis present

## 2018-07-04 DIAGNOSIS — D631 Anemia in chronic kidney disease: Secondary | ICD-10-CM | POA: Diagnosis present

## 2018-07-04 DIAGNOSIS — N179 Acute kidney failure, unspecified: Principal | ICD-10-CM | POA: Diagnosis present

## 2018-07-04 DIAGNOSIS — Z882 Allergy status to sulfonamides status: Secondary | ICD-10-CM

## 2018-07-04 DIAGNOSIS — Z91040 Latex allergy status: Secondary | ICD-10-CM

## 2018-07-04 DIAGNOSIS — I130 Hypertensive heart and chronic kidney disease with heart failure and stage 1 through stage 4 chronic kidney disease, or unspecified chronic kidney disease: Secondary | ICD-10-CM | POA: Diagnosis present

## 2018-07-04 DIAGNOSIS — Z7982 Long term (current) use of aspirin: Secondary | ICD-10-CM

## 2018-07-04 DIAGNOSIS — I361 Nonrheumatic tricuspid (valve) insufficiency: Secondary | ICD-10-CM | POA: Diagnosis not present

## 2018-07-04 DIAGNOSIS — Z7189 Other specified counseling: Secondary | ICD-10-CM | POA: Diagnosis not present

## 2018-07-04 DIAGNOSIS — Z79899 Other long term (current) drug therapy: Secondary | ICD-10-CM

## 2018-07-04 LAB — BASIC METABOLIC PANEL
ANION GAP: 14 (ref 5–15)
BUN: 75 mg/dL — AB (ref 8–23)
CALCIUM: 8.9 mg/dL (ref 8.9–10.3)
CO2: 13 mmol/L — ABNORMAL LOW (ref 22–32)
CREATININE: 5.11 mg/dL — AB (ref 0.44–1.00)
Chloride: 105 mmol/L (ref 98–111)
GFR calc Af Amer: 9 mL/min — ABNORMAL LOW (ref >60)
GFR, EST NON AFRICAN AMERICAN: 7 mL/min — AB (ref >60)
GLUCOSE: 327 mg/dL — AB (ref 70–99)
Potassium: 7.3 mmol/L (ref 3.5–5.1)
Sodium: 132 mmol/L — ABNORMAL LOW (ref 135–145)

## 2018-07-04 LAB — MRSA PCR SCREENING: MRSA by PCR: NEGATIVE

## 2018-07-04 LAB — GLUCOSE, CAPILLARY
GLUCOSE-CAPILLARY: 255 mg/dL — AB (ref 70–99)
GLUCOSE-CAPILLARY: 216 mg/dL — AB (ref 70–99)
Glucose-Capillary: 271 mg/dL — ABNORMAL HIGH (ref 70–99)
Glucose-Capillary: 260 mg/dL — ABNORMAL HIGH (ref 70–99)

## 2018-07-04 LAB — CBC
HCT: 25.1 % — ABNORMAL LOW (ref 35.0–47.0)
HCT: 23 % — ABNORMAL LOW (ref 35.0–47.0)
Hemoglobin: 8 g/dL — ABNORMAL LOW (ref 12.0–16.0)
Hemoglobin: 7.4 g/dL — ABNORMAL LOW (ref 12.0–16.0)
MCH: 29.8 pg (ref 26.0–34.0)
MCH: 29.9 pg (ref 26.0–34.0)
MCHC: 31.8 g/dL — AB (ref 32.0–36.0)
MCHC: 32.4 g/dL (ref 32.0–36.0)
MCV: 93.9 fL (ref 80.0–100.0)
MCV: 92.3 fL (ref 80.0–100.0)
PLATELETS: 339 10*3/uL (ref 150–440)
PLATELETS: 314 10*3/uL (ref 150–440)
RBC: 2.67 MIL/uL — ABNORMAL LOW (ref 3.80–5.20)
RBC: 2.49 MIL/uL — ABNORMAL LOW (ref 3.80–5.20)
RDW: 18.8 % — AB (ref 11.5–14.5)
RDW: 18.6 % — AB (ref 11.5–14.5)
WBC: 8.8 10*3/uL (ref 3.6–11.0)
WBC: 8.8 10*3/uL (ref 3.6–11.0)

## 2018-07-04 LAB — HEPATIC FUNCTION PANEL
ALK PHOS: 78 U/L (ref 38–126)
ALT: 61 U/L — ABNORMAL HIGH (ref 0–44)
AST: 85 U/L — ABNORMAL HIGH (ref 15–41)
Albumin: 3.8 g/dL (ref 3.5–5.0)
BILIRUBIN TOTAL: 0.4 mg/dL (ref 0.3–1.2)
Bilirubin, Direct: <0.1 mg/dL (ref 0.0–0.2)
TOTAL PROTEIN: 7 g/dL (ref 6.5–8.1)

## 2018-07-04 LAB — TROPONIN I

## 2018-07-04 LAB — URINALYSIS, COMPLETE (UACMP) WITH MICROSCOPIC
BILIRUBIN URINE: NEGATIVE
Bacteria, UA: NONE SEEN
GLUCOSE, UA: NEGATIVE mg/dL
Hgb urine dipstick: NEGATIVE
KETONES UR: NEGATIVE mg/dL
Nitrite: NEGATIVE
PH: 5 (ref 5.0–8.0)
PROTEIN: 100 mg/dL — AB
Specific Gravity, Urine: 1.018 (ref 1.005–1.030)

## 2018-07-04 LAB — BRAIN NATRIURETIC PEPTIDE: B Natriuretic Peptide: 3147 pg/mL — ABNORMAL HIGH (ref 0.0–100.0)

## 2018-07-04 LAB — LIPASE, BLOOD: LIPASE: 63 U/L — AB (ref 11–51)

## 2018-07-04 MED ORDER — SODIUM CHLORIDE 0.9 % IV SOLN
1.0000 g | Freq: Once | INTRAVENOUS | Status: AC
  Administered 2018-07-04: 1 g via INTRAVENOUS
  Filled 2018-07-04: qty 10

## 2018-07-04 MED ORDER — ALBUTEROL SULFATE (2.5 MG/3ML) 0.083% IN NEBU
2.5000 mg | INHALATION_SOLUTION | RESPIRATORY_TRACT | Status: AC
  Administered 2018-07-04: 2.5 mg via RESPIRATORY_TRACT
  Filled 2018-07-04: qty 3

## 2018-07-04 MED ORDER — LAMOTRIGINE 25 MG PO TABS
25.0000 mg | ORAL_TABLET | Freq: Every day | ORAL | Status: DC
  Administered 2018-07-05 – 2018-07-14 (×10): 25 mg via ORAL
  Filled 2018-07-04 (×11): qty 1

## 2018-07-04 MED ORDER — INSULIN ASPART 100 UNIT/ML ~~LOC~~ SOLN
SUBCUTANEOUS | Status: AC
  Administered 2018-07-04: 5 [IU] via INTRAVENOUS
  Filled 2018-07-04: qty 1

## 2018-07-04 MED ORDER — INSULIN ASPART 100 UNIT/ML ~~LOC~~ SOLN
5.0000 [IU] | Freq: Once | SUBCUTANEOUS | Status: AC
  Administered 2018-07-04: 5 [IU] via INTRAVENOUS

## 2018-07-04 MED ORDER — ASPIRIN EC 81 MG PO TBEC
81.0000 mg | DELAYED_RELEASE_TABLET | Freq: Every day | ORAL | Status: DC
  Administered 2018-07-05 – 2018-07-14 (×10): 81 mg via ORAL
  Filled 2018-07-04 (×11): qty 1

## 2018-07-04 MED ORDER — INSULIN ASPART 100 UNIT/ML ~~LOC~~ SOLN
0.0000 [IU] | Freq: Three times a day (TID) | SUBCUTANEOUS | Status: DC
  Administered 2018-07-05 (×2): 3 [IU] via SUBCUTANEOUS
  Administered 2018-07-05: 2 [IU] via SUBCUTANEOUS
  Administered 2018-07-06: 12:00:00 1 [IU] via SUBCUTANEOUS
  Administered 2018-07-06: 18:00:00 3 [IU] via SUBCUTANEOUS
  Administered 2018-07-06: 1 [IU] via SUBCUTANEOUS
  Administered 2018-07-07: 5 [IU] via SUBCUTANEOUS
  Administered 2018-07-07: 17:00:00 3 [IU] via SUBCUTANEOUS
  Administered 2018-07-07: 13:00:00 9 [IU] via SUBCUTANEOUS
  Administered 2018-07-08: 18:00:00 5 [IU] via SUBCUTANEOUS
  Administered 2018-07-08 – 2018-07-09 (×3): 7 [IU] via SUBCUTANEOUS
  Administered 2018-07-09 (×2): 3 [IU] via SUBCUTANEOUS
  Administered 2018-07-10: 2 [IU] via SUBCUTANEOUS
  Administered 2018-07-10: 17:00:00 5 [IU] via SUBCUTANEOUS
  Administered 2018-07-10: 2 [IU] via SUBCUTANEOUS
  Administered 2018-07-11 (×2): 5 [IU] via SUBCUTANEOUS
  Administered 2018-07-11: 09:00:00 3 [IU] via SUBCUTANEOUS
  Administered 2018-07-12: 2 [IU] via SUBCUTANEOUS
  Administered 2018-07-12 (×2): 3 [IU] via SUBCUTANEOUS
  Administered 2018-07-13 (×2): 2 [IU] via SUBCUTANEOUS
  Administered 2018-07-13: 3 [IU] via SUBCUTANEOUS
  Administered 2018-07-14: 09:00:00 2 [IU] via SUBCUTANEOUS
  Filled 2018-07-04 (×28): qty 1

## 2018-07-04 MED ORDER — INSULIN ASPART 100 UNIT/ML ~~LOC~~ SOLN
10.0000 [IU] | Freq: Once | SUBCUTANEOUS | Status: AC
  Administered 2018-07-04: 10 [IU] via INTRAVENOUS

## 2018-07-04 MED ORDER — POLYSACCHARIDE IRON COMPLEX 150 MG PO CAPS
150.0000 mg | ORAL_CAPSULE | Freq: Three times a day (TID) | ORAL | Status: DC
  Administered 2018-07-05 – 2018-07-14 (×29): 150 mg via ORAL
  Filled 2018-07-04 (×33): qty 1

## 2018-07-04 MED ORDER — POLYETHYLENE GLYCOL 3350 17 G PO PACK
17.0000 g | PACK | Freq: Every day | ORAL | Status: DC
  Administered 2018-07-05 – 2018-07-13 (×6): 17 g via ORAL
  Filled 2018-07-04 (×11): qty 1

## 2018-07-04 MED ORDER — RISAQUAD PO CAPS
1.0000 | ORAL_CAPSULE | Freq: Every day | ORAL | Status: DC
  Administered 2018-07-05 – 2018-07-13 (×10): 1 via ORAL
  Filled 2018-07-04 (×11): qty 1

## 2018-07-04 MED ORDER — ALBUTEROL SULFATE (2.5 MG/3ML) 0.083% IN NEBU
2.5000 mg | INHALATION_SOLUTION | Freq: Once | RESPIRATORY_TRACT | Status: AC
  Administered 2018-07-04: 2.5 mg via RESPIRATORY_TRACT
  Filled 2018-07-04: qty 3

## 2018-07-04 MED ORDER — LORATADINE 10 MG PO TABS
10.0000 mg | ORAL_TABLET | Freq: Every day | ORAL | Status: DC
  Administered 2018-07-05 – 2018-07-14 (×10): 10 mg via ORAL
  Filled 2018-07-04 (×11): qty 1

## 2018-07-04 MED ORDER — SODIUM BICARBONATE 8.4 % IV SOLN
INTRAVENOUS | Status: AC
  Administered 2018-07-04: 50 meq via INTRAVENOUS
  Filled 2018-07-04: qty 50

## 2018-07-04 MED ORDER — DOCUSATE SODIUM 100 MG PO CAPS
100.0000 mg | ORAL_CAPSULE | Freq: Two times a day (BID) | ORAL | Status: DC
  Administered 2018-07-05 – 2018-07-14 (×18): 100 mg via ORAL
  Filled 2018-07-04 (×21): qty 1

## 2018-07-04 MED ORDER — MELATONIN 5 MG PO TABS
5.0000 mg | ORAL_TABLET | Freq: Every day | ORAL | Status: DC
  Administered 2018-07-05 – 2018-07-13 (×10): 5 mg via ORAL
  Filled 2018-07-04 (×12): qty 1

## 2018-07-04 MED ORDER — SODIUM CHLORIDE 0.9 % IV SOLN: 250.0000 mL | INTRAVENOUS | Status: DC | PRN

## 2018-07-04 MED ORDER — PANTOPRAZOLE SODIUM 40 MG PO TBEC
40.0000 mg | DELAYED_RELEASE_TABLET | Freq: Two times a day (BID) | ORAL | Status: DC
  Administered 2018-07-05 – 2018-07-14 (×20): 40 mg via ORAL
  Filled 2018-07-04 (×21): qty 1

## 2018-07-04 MED ORDER — ONDANSETRON HCL 4 MG PO TABS: 4.0000 mg | ORAL_TABLET | Freq: Four times a day (QID) | ORAL | Status: DC | PRN

## 2018-07-04 MED ORDER — MELATONIN 3 MG PO TABS: 6.0000 mg | ORAL_TABLET | Freq: Every day | ORAL | Status: DC

## 2018-07-04 MED ORDER — ONDANSETRON HCL 4 MG/2ML IJ SOLN
4.0000 mg | Freq: Four times a day (QID) | INTRAMUSCULAR | Status: DC | PRN
  Administered 2018-07-05: 4 mg via INTRAVENOUS
  Filled 2018-07-04: qty 2

## 2018-07-04 MED ORDER — HEPARIN SODIUM (PORCINE) 5000 UNIT/ML IJ SOLN: 5000.0000 [IU] | Freq: Three times a day (TID) | INTRAMUSCULAR | Status: DC

## 2018-07-04 MED ORDER — ACETAMINOPHEN 325 MG PO TABS
650.0000 mg | ORAL_TABLET | Freq: Four times a day (QID) | ORAL | Status: DC | PRN
  Administered 2018-07-06 – 2018-07-07 (×2): 650 mg via ORAL
  Filled 2018-07-04 (×2): qty 2

## 2018-07-04 MED ORDER — SODIUM BICARBONATE 8.4 % IV SOLN
50.0000 meq | Freq: Once | INTRAVENOUS | Status: AC
  Administered 2018-07-04: 50 meq via INTRAVENOUS

## 2018-07-04 MED ORDER — SENNA 8.6 MG PO TABS
1.0000 | ORAL_TABLET | Freq: Every day | ORAL | Status: DC
  Administered 2018-07-05 – 2018-07-14 (×10): 8.6 mg via ORAL
  Filled 2018-07-04 (×11): qty 1

## 2018-07-04 MED ORDER — SODIUM CHLORIDE 0.9% FLUSH
3.0000 mL | Freq: Two times a day (BID) | INTRAVENOUS | Status: DC
  Administered 2018-07-05 – 2018-07-13 (×19): 3 mL via INTRAVENOUS

## 2018-07-04 MED ORDER — INSULIN ASPART 100 UNIT/ML ~~LOC~~ SOLN
0.0000 [IU] | Freq: Every day | SUBCUTANEOUS | Status: DC
  Administered 2018-07-06 – 2018-07-09 (×4): 3 [IU] via SUBCUTANEOUS
  Administered 2018-07-10: 4 [IU] via SUBCUTANEOUS
  Administered 2018-07-12 – 2018-07-13 (×2): 2 [IU] via SUBCUTANEOUS
  Filled 2018-07-04 (×7): qty 1

## 2018-07-04 MED ORDER — SENNOSIDES-DOCUSATE SODIUM 8.6-50 MG PO TABS: 1.0000 | ORAL_TABLET | Freq: Every evening | ORAL | Status: DC | PRN

## 2018-07-04 MED ORDER — OLANZAPINE 10 MG PO TBDP
10.0000 mg | ORAL_TABLET | Freq: Every day | ORAL | Status: DC
  Administered 2018-07-05 – 2018-07-13 (×10): 10 mg via ORAL
  Filled 2018-07-04 (×12): qty 1

## 2018-07-04 MED ORDER — SODIUM CHLORIDE 0.9% FLUSH: 3.0000 mL | INTRAVENOUS | Status: DC | PRN

## 2018-07-04 MED ORDER — BISACODYL 5 MG PO TBEC: 5.0000 mg | DELAYED_RELEASE_TABLET | Freq: Every day | ORAL | Status: DC | PRN

## 2018-07-04 MED ORDER — FUROSEMIDE 10 MG/ML IJ SOLN
60.0000 mg | Freq: Once | INTRAMUSCULAR | Status: AC
  Administered 2018-07-04: 60 mg via INTRAVENOUS
  Filled 2018-07-04: qty 8

## 2018-07-04 MED ORDER — SODIUM BICARBONATE 8.4 % IV SOLN
50.0000 meq | Freq: Once | INTRAVENOUS | Status: AC
  Administered 2018-07-04: 50 meq via INTRAVENOUS
  Filled 2018-07-04: qty 50

## 2018-07-04 MED ORDER — LEVETIRACETAM 250 MG PO TABS
250.0000 mg | ORAL_TABLET | Freq: Two times a day (BID) | ORAL | Status: DC
  Administered 2018-07-05 – 2018-07-14 (×20): 250 mg via ORAL
  Filled 2018-07-04 (×22): qty 1

## 2018-07-04 MED ORDER — ALBUTEROL SULFATE (2.5 MG/3ML) 0.083% IN NEBU: 2.5000 mg | INHALATION_SOLUTION | RESPIRATORY_TRACT | Status: DC | PRN

## 2018-07-04 MED ORDER — CITALOPRAM HYDROBROMIDE 20 MG PO TABS
10.0000 mg | ORAL_TABLET | Freq: Every day | ORAL | Status: DC
  Administered 2018-07-05 – 2018-07-14 (×10): 10 mg via ORAL
  Filled 2018-07-04 (×11): qty 1

## 2018-07-04 MED ORDER — ACETAMINOPHEN 650 MG RE SUPP: 650.0000 mg | Freq: Four times a day (QID) | RECTAL | Status: DC | PRN

## 2018-07-04 NOTE — ED Triage Notes (Addendum)
Patient coming from home via EMS for weakness and nausea, EMS reports that she was normal sinus at 86 but HR dropped to 40's and patient was pale, diaphoretic and cool.Patient CBG was 266.  Patient was no hx of bradycardia.Patient only had applesauce to today and has had decreased appetite. Patient denies any pain but states she got a little SOB with EMS and had brief episode of unresponsiveness.

## 2018-07-04 NOTE — ED Notes (Signed)
Patient was IV ultrasounded by MD twice and IV team has attempted 2x. Patient's one good line has infiltrated.

## 2018-07-04 NOTE — ED Notes (Signed)
Spoke with michelle rn charge nurse of [facility].  She is aware patient has on ly 1 iv and will accept the patient.  Iv team unsuccessful in starting a 2nd iv.

## 2018-07-04 NOTE — ED Notes (Signed)
Patient is in SB and MD at bedside to try again for IV access. 10 units of Insulin and 41ml of Bicarb were given through IV that has infiltrated. Calcium IV has been given yet.

## 2018-07-04 NOTE — ED Notes (Addendum)
Attempted at Foley Catheter but unsuccessful

## 2018-07-04 NOTE — Consult Note (Signed)
Name: Carolyn Valdez MRN: [id number] DOB: Jul 02, 1941    ADMISSION DATE:  07/04/2018 CONSULTATION DATE:  07/04/2018  REFERRING MD :  Dr. Bridgett Larsson  CHIEF COMPLAINT:  Weakness, Shortness of Breath on exertion  BRIEF PATIENT DESCRIPTION:  77 y.o. Female admitted with Hyperkalemia, AKI on CKD, Bradycardia, asymptomatic Bacteriuria, and Acute Hypoxic Respiratory Failure in setting of Acute on Chronic Diastolic CHF.  SIGNIFICANT EVENTS  07/04/18>> Admission to [place] 07/05/18>> Pt started on CRRT  STUDIES:  CT Head wo contrast 07/04/18>> negative for acute abnormality UA 07/04/18>> Moderate leukocytes, >50 WBC Echocardiogram 07/05/18>>   HISTORY OF PRESENT ILLNESS:   Carolyn Valdez is a 77 y.o. Female with a PMH as listed below who presents to [facility] ED on 07/04/2018 with complaints of weakness for several days. She reports Shortness of breath on exertion, nonproductive dry cough, and lower extremity edema.  She denies fever, chills, or sick contacts.  In the ED she was noted to have Bradycardia, with HR in 30's to 40's.  Initial workup shows Potassium 7.3, Creatinine 5.11, serum Bicarb 13, Anion gap 14, negative troponin, and BNP 3147. CXR was concerning for Stable cardiomegaly with No active lung disease. UA with moderate Leukocytes and >50 WBC's.  She was given 60 mg Lasix, 1g Calcium gluconate, 15 units of insulin, and 2 amps Bicarb in ED.  Nephrology consulted.  She is being admitted to [facility] for treatment of Hyperkalemia, AKI on CKD, Bradycardia, and Acute Hypoxic Respiratory Failure in setting of Acute on Chronic Diastolic CHF.  PCCM is consulted for further management.  PAST MEDICAL HISTORY :   has a past medical history of Anxiety, Breast cancer ([place]) (1997), CHF (congestive heart failure) ([place]), Diabetes mellitus without complication ([place]), DJD (degenerative joint disease), Dysuria, H/O total knee replacement, HTN (hypertension), Hypercholesteremia, Kidney stone, Obesity, Recurrent urinary  tract infection, SVT (supraventricular tachycardia) ([place]), Urinary incontinence, Vaginal atrophy, and Yeast vaginitis.  has a past surgical history that includes Cholecystectomy; Tonsillectomy; Vocal cords; Foot surgery; Cardiac catheterization; Nasal septum surgery; Joint replacement; Cardiac catheterization (N/A, 07/08/2015); and Mastectomy (Right, 1997). Prior to Admission medications   Medication Sig Start Date End Date Taking? Authorizing Provider  acetaminophen (TYLENOL) 325 MG tablet Take 2 tablets (650 mg total) by mouth every 6 (six) hours as needed for mild pain (or Fever >/= 101). 04/26/18   Loletha Grayer, MD  acetaminophen (TYLENOL) 650 MG CR tablet Take 650 mg by mouth every 6 (six) hours as needed for pain.    [provider]  acidophilus (RISAQUAD) CAPS capsule Take 1 capsule by mouth at bedtime.    [provider]  aspirin EC 81 MG EC tablet Take 1 tablet (81 mg total) by mouth daily. 04/26/18   Loletha Grayer, MD  citalopram (CELEXA) 10 MG tablet Take 1 tablet (10 mg total) by mouth daily. 02/04/17   Loletha Grayer, MD  docusate sodium (COLACE) 100 MG capsule Take 100 mg by mouth 2 (two) times daily.     [provider]  feeding supplement, ENSURE ENLIVE, (ENSURE ENLIVE) LIQD Take 237 mLs by mouth 3 (three) times daily between meals. Patient not taking: Reported on 04/22/2018 10/26/16   Epifanio Lesches, MD  furosemide (LASIX) 40 MG tablet Take 1 tablet (40 mg total) by mouth 2 (two) times daily. 02/04/17   Loletha Grayer, MD  hydrALAZINE (APRESOLINE) 50 MG tablet Take 1 tablet (50 mg total) by mouth every 8 (eight) hours. 04/26/18   Loletha Grayer, MD  insulin aspart (NOVOLOG) 100  UNIT/ML injection Inject 4 Units into the skin 3 (three) times daily with meals. 04/26/18   Loletha Grayer, MD  insulin glargine (LANTUS) 100 UNIT/ML injection Inject 0.43 mLs (43 Units total) into the skin at bedtime. Patient not taking: Reported on 04/22/2018 02/04/17    Loletha Grayer, MD  ipratropium-albuterol (DUONEB) 0.5-2.5 (3) MG/3ML SOLN Take 3 mLs by nebulization 3 (three) times daily. Patient not taking: Reported on 04/22/2018 02/04/17   Loletha Grayer, MD  iron polysaccharides (NIFEREX) 150 MG capsule Take 150 mg by mouth 3 (three) times daily.     [provider]  lamoTRIgine (LAMICTAL) 25 MG tablet Take 1 tablet (25 mg total) by mouth daily. 04/26/18   Loletha Grayer, MD  levETIRAcetam (KEPPRA) 250 MG tablet Take 1 tablet (250 mg total) by mouth 2 (two) times daily. 10/26/16   Epifanio Lesches, MD  loratadine (CLARITIN) 10 MG tablet Take 10 mg by mouth daily.    [provider]  Melatonin 3 MG TABS Take 6 mg by mouth at bedtime.    [provider]  metoprolol (LOPRESSOR) 50 MG tablet Take 50 mg by mouth 2 (two) times daily.    [provider]  nitrofurantoin (MACRODANTIN) 100 MG capsule Take 1 capsule (100 mg total) by mouth daily. 02/06/18   MacDiarmid, Nicki Reaper, MD  OLANZapine zydis (ZYPREXA) 10 MG disintegrating tablet Take 1 tablet (10 mg total) by mouth at bedtime. 04/26/18   Loletha Grayer, MD  pantoprazole (PROTONIX) 40 MG tablet Take 40 mg by mouth 2 (two) times daily. 02/09/17   [provider]  polyethylene glycol (MIRALAX / GLYCOLAX) packet Take 17 g by mouth daily. 04/26/18   Loletha Grayer, MD  potassium chloride SA (K-DUR,KLOR-CON) 20 MEQ tablet Take 1 tablet (20 mEq total) by mouth 2 (two) times daily. 04/26/18   Loletha Grayer, MD  senna (SENOKOT) 8.6 MG TABS tablet Take 1 tablet (8.6 mg total) by mouth daily. 09/30/16   Theodoro Grist, MD   Allergies  Allergen Reactions  . Sulfa Antibiotics Hives  . Biaxin [Clarithromycin] Hives  . Influenza A (H1n1) Monoval Vac Other (See Comments)    Pt states that she was told by her MD not to get the influenza vaccine.    . Morphine Other (See Comments)    Reaction:  Dizziness and confusion   . Pyridium [Phenazopyridine Hcl] Other (See Comments)      Reaction:  Unknown   . Ceftriaxone Anxiety  . Latex Rash  . Prednisone Rash  . Tape Rash    FAMILY HISTORY:  family history includes Hematuria in her mother; Lung cancer in her father and mother. SOCIAL HISTORY:  reports that she has quit smoking. She quit smokeless tobacco use about 22 years ago. She reports that she does not drink alcohol or use drugs.  REVIEW OF SYSTEMS:  Positives in BOLD Constitutional: Negative for fever, chills, weight loss, malaise/fatigue and diaphoresis.  HENT: Negative for hearing loss, ear pain, nosebleeds, congestion, sore throat, neck pain, tinnitus and ear discharge.   Eyes: Negative for blurred vision, double vision, photophobia, pain, discharge and redness.  Respiratory: Negative for +cough, hemoptysis, sputum production, +shortness of breath on exertion, wheezing and stridor.   Cardiovascular: Negative for chest pain, palpitations, orthopnea, claudication, +leg swelling and PND.  Gastrointestinal: Negative for heartburn, nausea, vomiting, abdominal pain, diarrhea, constipation, blood in stool and melena.  Genitourinary: Negative for dysuria, urgency, frequency, hematuria and flank pain.  Musculoskeletal: Negative for myalgias, back pain, joint pain and falls.  Skin:  Negative for itching and rash.  Neurological: Negative for dizziness, tingling, tremors, sensory change, speech change, focal weakness, seizures, loss of consciousness, weakness and headaches.  Endo/Heme/Allergies: Negative for environmental allergies and polydipsia. Does not bruise/bleed easily.  SUBJECTIVE:  Pt reports that she is not currently short of breath laying in bed, but Does report shortness of breath with exertion Reports intermittent dry nonproductive cough Denies fever, chills, chest pain  VITAL SIGNS: Temp:  [97.5 F (36.4 C)] 97.5 F (36.4 C) (09/17 1911) Pulse Rate:  [77] 77 (09/17 1911) Resp:  [17-19] 19 (09/17 2116) BP: (163-174)/(45-63) 163/45 (09/17  2116) SpO2:  [89 %] 89 % (09/17 1911)  PHYSICAL EXAMINATION: General:  Acutely ill appearing obese female, sitting in bed, on nasal cannula, in NAD Neuro:  Awake, A&O x4, Follows commands, no focal deficits HEENT:  Atraumatic, normocephalic, neck supple Cardiovascular:  Bradycardia, regular rhythm (junctional), no M/R/G, palpable pulses throughout Lungs:  Clear bilaterally, even, nonlabored, normal effort Abdomen:  Obese, soft, tender, BS+ x4 Musculoskeletal:  No deformities, 2+ edema bilateral LE Skin:  Warm/dry.  No obvious rashes, lesions, or ulcerations  Recent Labs  Lab 07/04/18 1908  NA 132*  K 7.3*  CL 105  CO2 13*  BUN 75*  CREATININE 5.11*  GLUCOSE 327*   Recent Labs  Lab 07/04/18 1908  HGB 8.0*  HCT 25.1*  WBC 8.8  PLT 339   Ct Head Wo Contrast  Result Date: 07/04/2018 CLINICAL DATA:  Weakness and nausea EXAM: CT HEAD WITHOUT CONTRAST TECHNIQUE: Contiguous axial images were obtained from the base of the skull through the vertex without intravenous contrast. 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 No acute intracranial hemorrhage, intra-axial mass nor extra-axial fluid collections. No edema or midline shift. No hydrocephalus. Midline fourth ventricle and basal cisterns without effacement. Intact brainstem and cerebellum. Vascular: No hyperdense vessel sign.  No unexpected calcifications. Skull: No acute skull fracture or suspicious osseous lesions. Sinuses/Orbits: Intact orbits and globes.  Clear paranasal sinuses. Other: Clear mastoids. IMPRESSION: No acute intracranial abnormality. 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 Electronically Signed   By: Ashley Royalty M.D.   On: 07/04/2018 20:12   Dg Chest Portable 1 View  Result Date: 07/04/2018 CLINICAL DATA:  Weakness, nausea, bradycardia, and hypotension. EXAM: PORTABLE CHEST 1 VIEW COMPARISON:  04/22/2018 FINDINGS: Stable  mild-to-moderate cardiomegaly. Aortic atherosclerosis. No evidence of acute infiltrate or edema. No evidence of pleural effusion. IMPRESSION: Stable cardiomegaly.  No active lung disease. Electronically Signed   By: Earle Gell M.D.   On: 07/04/2018 20:10    ASSESSMENT / PLAN:  A: Hyperkalemia AKI on CKD Anion gap metabolic acidosis in setting of AKI Bradycardia in setting of Hyperkalemia Acute on Chronic Diastolic CHF  -BNP 9326 Acute Hypoxic Respiratory Failure in setting of Acute on Chronic Diastolic CHF Asymptomatic Bacteriuria -UA w/ moderate leukocytes and >50 WBC, but pt denies dysuria, frequency, and reports chronic UTI's Anemia without signs of Bleeding DM II with Hyperglycemia Hx: SVT, Recurrent UTI's, Obesity, HTN, DM II, CHF, Breast CA s/p mastectomy, anxiety, kidney stones P: -Pt given 1g Ca Gluconate, 15 units insulin, and 2 amps Bicarb in ED 9/17 -Follow up BMP -Nephrology consulted, appreciate input -Discussed with Nephrology, will place Temporary dialysis catheter -Will likely need urgent HD vs CRRT per Nephrology recommendations -Renal ultrasound pending -Monitor I&O's / urinary output -Follow BMP -Ensure adequate renal perfusion -Avoid nephrotoxic agents as able -Replace electrolytes as indicated -Cardiac monitoring -  Cardiology consulted, appreciate input -Received 60 mg Lasix x1 dose in ED -Volume removal with CRRT -Obtain Echocardiogram -Supplemental O2 to maintain O2 sats >92% -Follow intermittent CXR -Obtain Urine culture -Will hold off on initiation of ABX for asymptomatic Bacteriuria as this time, follow culture results -Monitor for s/sx of bleeding -Trend CBC -Heparin SQ for VTE prophylaxis -Transfuse for Hgb<7 -CBG's -SSI   Disposition: ICU Goals of Care: Full Code VTE prophylaxis: Heparin SQ Updates: Updated pt at bedside 07/04/18.     Darel Hong, AGACNP-BC [facility] Pager:  [phone]   07/04/2018, 9:28 PM

## 2018-07-04 NOTE — ED Provider Notes (Signed)
[facility] Emergency Department Provider Note   ____________________________________________   First MD Initiated Contact with Patient 07/04/18 2016     (approximate)  I have reviewed the triage vital signs and the nursing notes.   HISTORY  Chief Complaint Weakness    HPI Carolyn Valdez is a 77 y.o. female here for evaluation of feeling fatigued, short of breath today  Patient presents for feeling very fatigued.  Reports she has not been feeling well for a few days time.  Called EMS when she felt like she is in a pass out.  EMS reports when they arrived she did pass out briefly with an episode of low heart rate bradycardia.  No chest pain, but reports some shortness of breath of the last day, somewhat worse with sleep being or laying down.  No fevers or chills.  No pain.  Reports she is never had these symptoms before.  Nothing makes it better or worse except for trying to sit still and rest.  Worsened by walking  Denies recent surgeries.  Past Medical History:  Diagnosis Date  . Anxiety   . Breast cancer ([place]) 1997   right breast mastectomy  . CHF (congestive heart failure) ([place])   . Diabetes mellitus without complication ([place])   . DJD (degenerative joint disease)   . Dysuria   . H/O total knee replacement    right  . HTN (hypertension)   . Hypercholesteremia   . Kidney stone   . Obesity   . Recurrent urinary tract infection   . SVT (supraventricular tachycardia) ([place])   . Urinary incontinence   . Vaginal atrophy   . Yeast vaginitis     Patient Active Problem List   Diagnosis Date Noted  . Hyperkalemia 07/04/2018  . Atrial fibrillation with RVR ([place]) 04/23/2018  . Syncope 04/22/2018  . Acute respiratory failure with hypoxia and hypercapnia ([place]) 02/21/2017  . Adjustment disorder with anxiety 01/28/2017  . Acute on chronic respiratory failure with hypoxia ([place]) 01/23/2017  . Palliative care encounter   . Goals of care,  counseling/discussion   . DNR (do not resuscitate) 10/05/2016  . Palliative care by specialist 10/05/2016  . 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 Severe major depression, single episode, with psychotic features ([place]) 10/04/2016  . Cellulitis of leg, right 09/29/2016  . Proctitis 09/29/2016  . Hypercapnia 09/29/2016  . Acute urinary retention 09/29/2016  . UTI (urinary tract infection) 09/29/2016  . Weakness 09/29/2016  . Subacute delirium 09/28/2016  . Gastrointestinal hemorrhage   . Diffuse abdominal pain   . Altered mental status 09/24/2016  . Pressure injury of skin 09/02/2016  . Respiratory failure with hypoxia ([place]) 09/01/2016  . Lymphedema 08/16/2016  . Acute on chronic congestive heart failure ([place]) 08/09/2016  . Acquired lymphedema of leg 04/21/2016  . Congestive heart failure ([place]) 11/25/2015  . Nocturia 11/05/2015  . Urinary frequency 11/05/2015  . Acute respiratory failure with hypoxia ([place]) 09/05/2015  . 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 Incontinence 04/20/2015  . Diabetes mellitus, type 2 ([place]) 04/17/2015  . ESBL (extended spectrum beta-lactamase) producing bacteria infection 04/17/2015  . BP (high blood pressure) 04/17/2015  . Frequent UTI 04/17/2015  . Absence of bladder continence 04/17/2015  . Iron deficiency anemia 03/08/2015  . Absolute anemia 09/25/2014  . Abdominal pain, lower 09/25/2014  . Urge incontinence 02/19/2013  . FOM (frequency of micturition) 02/19/2013  . Bladder infection, chronic 08/11/2012  . Difficult or painful urination 07/26/2012  .  Lower urinary tract infection 12/31/2011  . Diabetes mellitus ([place]) 12/31/2011    Past Surgical History:  Procedure Laterality Date  . CARDIAC CATHETERIZATION    . CHOLECYSTECTOMY    . FOOT SURGERY    . JOINT REPLACEMENT    . MASTECTOMY Right 1997  . NASAL SEPTUM SURGERY    . PERIPHERAL VASCULAR CATHETERIZATION N/A 07/08/2015   Procedure: PICC Line Insertion;  Surgeon:  Algernon Huxley, MD;  Location: [place] [place] [facility];  Service: Cardiovascular;  Laterality: N/A;  . TONSILLECTOMY    . Vocal cords      Prior to Admission medications   Medication Sig Start Date End Date Taking? Authorizing Provider  acetaminophen (TYLENOL) 325 MG tablet Take 2 tablets (650 mg total) by mouth every 6 (six) hours as needed for mild pain (or Fever >/= 101). 04/26/18   Loletha Grayer, MD  acetaminophen (TYLENOL) 650 MG CR tablet Take 650 mg by mouth every 6 (six) hours as needed for pain.    [provider]  acidophilus (RISAQUAD) CAPS capsule Take 1 capsule by mouth at bedtime.    [provider]  aspirin EC 81 MG EC tablet Take 1 tablet (81 mg total) by mouth daily. 04/26/18   Loletha Grayer, MD  citalopram (CELEXA) 10 MG tablet Take 1 tablet (10 mg total) by mouth daily. 02/04/17   Loletha Grayer, MD  docusate sodium (COLACE) 100 MG capsule Take 100 mg by mouth 2 (two) times daily.     [provider]  feeding supplement, ENSURE ENLIVE, (ENSURE ENLIVE) LIQD Take 237 mLs by mouth 3 (three) times daily between meals. Patient not taking: Reported on 04/22/2018 10/26/16   Epifanio Lesches, MD  furosemide (LASIX) 40 MG tablet Take 1 tablet (40 mg total) by mouth 2 (two) times daily. 02/04/17   Loletha Grayer, MD  hydrALAZINE (APRESOLINE) 50 MG tablet Take 1 tablet (50 mg total) by mouth every 8 (eight) hours. 04/26/18   Loletha Grayer, MD  insulin aspart (NOVOLOG) 100 UNIT/ML injection Inject 4 Units into the skin 3 (three) times daily with meals. 04/26/18   Loletha Grayer, MD  insulin glargine (LANTUS) 100 UNIT/ML injection Inject 0.43 mLs (43 Units total) into the skin at bedtime. Patient not taking: Reported on 04/22/2018 02/04/17   Loletha Grayer, MD  ipratropium-albuterol (DUONEB) 0.5-2.5 (3) MG/3ML SOLN Take 3 mLs by nebulization 3 (three) times daily. Patient not taking: Reported on 04/22/2018 02/04/17   Loletha Grayer, MD  iron polysaccharides  (NIFEREX) 150 MG capsule Take 150 mg by mouth 3 (three) times daily.     [provider]  lamoTRIgine (LAMICTAL) 25 MG tablet Take 1 tablet (25 mg total) by mouth daily. 04/26/18   Loletha Grayer, MD  levETIRAcetam (KEPPRA) 250 MG tablet Take 1 tablet (250 mg total) by mouth 2 (two) times daily. 10/26/16   Epifanio Lesches, MD  loratadine (CLARITIN) 10 MG tablet Take 10 mg by mouth daily.    [provider]  Melatonin 3 MG TABS Take 6 mg by mouth at bedtime.    [provider]  metoprolol (LOPRESSOR) 50 MG tablet Take 50 mg by mouth 2 (two) times daily.    [provider]  nitrofurantoin (MACRODANTIN) 100 MG capsule Take 1 capsule (100 mg total) by mouth daily. 02/06/18   MacDiarmid, Nicki Reaper, MD  OLANZapine zydis (ZYPREXA) 10 MG disintegrating tablet Take 1 tablet (10 mg total) by mouth at bedtime. 04/26/18   Loletha Grayer, MD  pantoprazole (PROTONIX) 40 MG tablet Take  40 mg by mouth 2 (two) times daily. 02/09/17   [provider]  polyethylene glycol (MIRALAX / GLYCOLAX) packet Take 17 g by mouth daily. 04/26/18   Loletha Grayer, MD  potassium chloride SA (K-DUR,KLOR-CON) 20 MEQ tablet Take 1 tablet (20 mEq total) by mouth 2 (two) times daily. 04/26/18   Loletha Grayer, MD  senna (SENOKOT) 8.6 MG TABS tablet Take 1 tablet (8.6 mg total) by mouth daily. 09/30/16   Theodoro Grist, MD    Allergies Sulfa antibiotics; Biaxin [clarithromycin]; Influenza a (h1n1) monoval vac; Morphine; Pyridium [phenazopyridine hcl]; Ceftriaxone; Latex; Prednisone; and Tape  Family History  Problem Relation Age of Onset  . Lung cancer Father   . Hematuria Mother   . Lung cancer Mother   . Kidney disease Neg Hx   . Bladder Cancer Neg Hx   . Breast cancer Neg Hx     Social History Social History   Tobacco Use  . Smoking status: Former Research scientist (life sciences)  . Smokeless tobacco: Former Systems developer    Quit date: 10/29/1995  Substance Use Topics  . Alcohol use: No    Alcohol/week:  0.0 standard drinks  . Drug use: No    Review of Systems Constitutional: No fever/chills but feeling fatigued Eyes: No visual changes. ENT: No sore throat. Cardiovascular: Denies chest pain. Respiratory: See HPI  gastrointestinal: No abdominal pain.  No nausea, no vomiting.  No diarrhea.  No constipation. Genitourinary: Negative for dysuria. Musculoskeletal: Negative for back pain. Skin: Negative for rash. Neurological: Negative for headaches, focal weakness or numbness.    ____________________________________________   PHYSICAL EXAM:  VITAL SIGNS: ED Triage Vitals  Enc Vitals Group     BP 07/04/18 1911 (!) 174/63     Pulse Rate 07/04/18 1911 77     Resp 07/04/18 1911 17     Temp 07/04/18 1911 (!) 97.5 F (36.4 C)     Temp Source 07/04/18 1911 Oral     SpO2 07/04/18 1911 (!) 89 %     Weight --      Height --      Head Circumference --      Peak Flow --      Pain Score 07/04/18 1912 0     Pain Loc --      Pain Edu? --      Excl. in [place]? --     Constitutional: Alert and oriented.  Appears fatigued, somewhat pale but in no acute distress.  Eyes: Conjunctivae are normal. Head: Atraumatic. Nose: No congestion/rhinnorhea. Mouth/Throat: Mucous membranes are moist. Neck: No stridor.  There is mild JVD, somewhat hard to appreciate due to thickness of the neck. Cardiovascular: Normal rate, regular rhythm. Grossly normal heart sounds.  Good peripheral circulation. Respiratory: Normal respiratory effort.  No retractions. Lungs CTAB for what appear to be some very slight crackles in the lower bases bilateral. Gastrointestinal: Soft and nontender. No distention. Musculoskeletal: No lower extremity tenderness but has bilateral lower extremity notable lymphedema about 4+ Neurologic:  Normal speech and language. No gross focal neurologic deficits are appreciated.  Skin:  Skin is warm, dry and intact. No rash noted. Psychiatric: Mood and affect are normal. Speech and behavior are  normal.  ____________________________________________   LABS (all labs ordered are listed, but only abnormal results are displayed)  Labs Reviewed  BASIC METABOLIC PANEL - Abnormal; Notable for the following components:      Result Value   Sodium 132 (*)    Potassium 7.3 (*)    CO2 13 (*)  Glucose, Bld 327 (*)    BUN 75 (*)    Creatinine, Ser 5.11 (*)    GFR calc non Af Amer 7 (*)    GFR calc Af Amer 9 (*)    All other components within normal limits  CBC - Abnormal; Notable for the following components:   RBC 2.67 (*)    Hemoglobin 8.0 (*)    HCT 25.1 (*)    MCHC 31.8 (*)    RDW 18.8 (*)    All other components within normal limits  URINALYSIS, COMPLETE (UACMP) WITH MICROSCOPIC - Abnormal; Notable for the following components:   Color, Urine AMBER (*)    APPearance CLOUDY (*)    Protein, ur 100 (*)    Leukocytes, UA MODERATE (*)    WBC, UA >50 (*)    All other components within normal limits  BRAIN NATRIURETIC PEPTIDE - Abnormal; Notable for the following components:   B Natriuretic Peptide 3,147.0 (*)    All other components within normal limits  HEPATIC FUNCTION PANEL - Abnormal; Notable for the following components:   AST 85 (*)    ALT 61 (*)    All other components within normal limits  LIPASE, BLOOD - Abnormal; Notable for the following components:   Lipase 63 (*)    All other components within normal limits  GLUCOSE, CAPILLARY - Abnormal; Notable for the following components:   Glucose-Capillary 255 (*)    All other components within normal limits  GLUCOSE, CAPILLARY - Abnormal; Notable for the following components:   Glucose-Capillary 271 (*)    All other components within normal limits  GLUCOSE, CAPILLARY - Abnormal; Notable for the following components:   Glucose-Capillary 260 (*)    All other components within normal limits  GLUCOSE, CAPILLARY - Abnormal; Notable for the following components:   Glucose-Capillary 216 (*)    All other components within  normal limits  MRSA PCR SCREENING  URINE CULTURE  TROPONIN I  CBC  BASIC METABOLIC PANEL  CBC  BASIC METABOLIC PANEL  MAGNESIUM  CBG MONITORING, ED  CBG MONITORING, ED  CBG MONITORING, ED  CBG MONITORING, ED  CBG MONITORING, ED   ____________________________________________  EKG  Reviewed and interpreted by me at 1905 Heart rate 70 QRS 140 QTc 500 Junctional rhythm, slight abnormality are very minimal depression is noted in 2 and V2, compared with previous it appears improved.  No evidence of acute ischemia.  Junctional rhythm is noted ____________________________________________  RADIOLOGY  Ct Head Wo Contrast  Result Date: 07/04/2018 CLINICAL DATA:  Weakness and nausea EXAM: CT HEAD WITHOUT CONTRAST TECHNIQUE: Contiguous axial images were obtained from the base of the skull through the vertex without intravenous contrast. 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 No acute intracranial hemorrhage, intra-axial mass nor extra-axial fluid collections. No edema or midline shift. No hydrocephalus. Midline fourth ventricle and basal cisterns without effacement. Intact brainstem and cerebellum. Vascular: No hyperdense vessel sign.  No unexpected calcifications. Skull: No acute skull fracture or suspicious osseous lesions. Sinuses/Orbits: Intact orbits and globes.  Clear paranasal sinuses. Other: Clear mastoids. IMPRESSION: No acute intracranial abnormality. 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 Electronically Signed   By: Ashley Royalty M.D.   On: 07/04/2018 20:12   Dg Chest Portable 1 View  Result Date: 07/04/2018 CLINICAL DATA:  Weakness, nausea, bradycardia, and hypotension. EXAM: PORTABLE CHEST 1 VIEW COMPARISON:  04/22/2018 FINDINGS: Stable mild-to-moderate cardiomegaly. Aortic atherosclerosis. No evidence of acute infiltrate  or edema. No evidence of pleural effusion. IMPRESSION: Stable  cardiomegaly.  No active lung disease. Electronically Signed   By: Earle Gell M.D.   On: 07/04/2018 20:10    CT head reviewed by me no acute findings.   ____________________________________________   PROCEDURES  Procedure(s) performed: None  Procedures  Critical Care performed: Yes, see critical care note(s)  CRITICAL CARE Performed by: Delman Kitten   Total critical care time: 50 minutes  Critical care time was exclusive of separately billable procedures and treating other patients.  Critical care was necessary to treat or prevent imminent or life-threatening deterioration.  Critical care was time spent personally by me on the following activities: development of treatment plan with patient and/or surrogate as well as nursing, discussions with consultants, evaluation of patient's response to treatment, examination of patient, obtaining history from patient or surrogate, ordering and performing treatments and interventions, ordering and review of laboratory studies, ordering and review of radiographic studies, pulse oximetry and re-evaluation of patient's condition.  ----------------------------------------- 8:32 PM on 07/04/2018 -----------------------------------------  Patient noted to have bradycardia, heart rate dropping into the mid 30s to high 40s it appears junctional in nature.  Initiating emergent treatments for hyperkalemia, nursing staff present in room assisting care.  Patient remains alert and oriented. ____________________________________________   INITIAL IMPRESSION / ASSESSMENT AND PLAN / ED COURSE  Pertinent labs & imaging results that were available during my care of the patient were reviewed by me and considered in my medical decision making (see chart for details).  Patient was for evaluation of fatigue, found to have bradycardia and syncope with EMS.  EKG does not show evidence of acute ischemia but does show a junctional rhythm with a normal rate.   Shortly after being in the ER, patient had another episode of bradycardia but was asymptomatic her heart rate dropped into the mid 30s briefly, and lab work returned which shows acute kidney injury of potassium greater than 7.  Immediate potassium lowering therapies provided, some slight delay due to very poor venous access.  No neurologic deficits.  No chest pain.    ----------------------------------------- 8:22 PM on 07/04/2018 -----------------------------------------  Case and care discussed with hospitalist Dr. Vallarie Mare is currently at the bedside evaluating the patient for admission.  Additionally have consulted and spoken with Dr. Abigail Butts of nephrology and he is evaluating the patient's chart now understanding of the request for stat consult regarding AKI, hyper kalemia, bradycardia.  Patient remains on the pacing pads, telemetry monitoring, emergent potassium lowering medications ordered. Some delay in insulin administration due to having to get from pharmacy.   ----------------------------------------- 8:43 PM on 07/04/2018 -----------------------------------------  Patient prophylaxis challenging, multiple attempts made.  Made 2 attempts in the left brachiocephalic region myself, used sterile cleansing preparation and procedure with each attempt.  Able to cannulate vessel, but unable to flush on each attempt.  Discontinued.  IV access team currently with the patient working to establish peripheral access.  She does have one functioning IV in the left hand at this time  ----------------------------------------- 9:05 PM on 07/04/2018 -----------------------------------------  Was personally able to utilize ultrasound guidance to place a 20-gauge IV just lateral to the left AC.  Excellent flush with good blood return.  Utilized sterile ultrasound gel and aseptic technique.  Flushes well without infiltrate.  IV team was unsuccessful in multiple times  previous. ____________________________________________   FINAL CLINICAL IMPRESSION(S) / ED DIAGNOSES  Final diagnoses:  Hypervolemia, unspecified hypervolemia type  Hyperkalemia      NEW MEDICATIONS  STARTED DURING THIS VISIT:  Current Discharge Medication List       Note:  This document was prepared using Dragon voice recognition software and may include unintentional dictation errors.     Delman Kitten, MD 07/04/18 [phone]

## 2018-07-04 NOTE — H&P (Addendum)
[facility] [place] NAME: Carolyn [place]    MR#:  [id number]  DATE OF BIRTH:  31-Jan-1941  DATE OF ADMISSION:  07/04/2018  PRIMARY CARE PHYSICIAN: Leonel Ramsay, MD   REQUESTING/REFERRING PHYSICIAN: Dr. Jacqualine Code.  CHIEF COMPLAINT:   Chief Complaint  Patient presents with  . Weakness   Generalized weakness for few days. HISTORY OF PRESENT ILLNESS:  Carolyn Valdez  is a 77 y.o. female with a known history of CHF, diabetes, hypertension, hyperlipidemia, breast cancer, recurrent UTI and incontinence etc.  The patient presents the ED with above chief complaint.  She has been feeling very weak for the past few days.  In addition she complains of shortness of breath on exertion and leg edema.  She is found hypoxia in the ED and put on oxygen by nasal cannula.  She also has bradycardia at a 30 to 40s in the ED.  Labs come back show high potassium 7.3 and renal failure.  She is being treated with cocktail of medications for hyperkalemia now. PAST MEDICAL HISTORY:   Past Medical History:  Diagnosis Date  . Anxiety   . Breast cancer ([place]) 1997   right breast mastectomy  . CHF (congestive heart failure) ([place])   . Diabetes mellitus without complication ([place])   . DJD (degenerative joint disease)   . Dysuria   . H/O total knee replacement    right  . HTN (hypertension)   . Hypercholesteremia   . Kidney stone   . Obesity   . Recurrent urinary tract infection   . SVT (supraventricular tachycardia) ([place])   . Urinary incontinence   . Vaginal atrophy   . Yeast vaginitis     PAST SURGICAL HISTORY:   Past Surgical History:  Procedure Laterality Date  . CARDIAC CATHETERIZATION    . CHOLECYSTECTOMY    . FOOT SURGERY    . JOINT REPLACEMENT    . MASTECTOMY Right 1997  . NASAL SEPTUM SURGERY    . PERIPHERAL VASCULAR CATHETERIZATION N/A 07/08/2015   Procedure: PICC Line Insertion;  Surgeon: Algernon Huxley, MD;  Location: [facility];  Service:  Cardiovascular;  Laterality: N/A;  . TONSILLECTOMY    . Vocal cords      SOCIAL HISTORY:   Social History   Tobacco Use  . Smoking status: Former Research scientist (life sciences)  . Smokeless tobacco: Former Systems developer    Quit date: 10/29/1995  Substance Use Topics  . Alcohol use: No    Alcohol/week: 0.0 standard drinks    FAMILY HISTORY:   Family History  Problem Relation Age of Onset  . Lung cancer Father   . Hematuria Mother   . Lung cancer Mother   . Kidney disease Neg Hx   . Bladder Cancer Neg Hx   . Breast cancer Neg Hx     DRUG ALLERGIES:   Allergies  Allergen Reactions  . Sulfa Antibiotics Hives  . Biaxin [Clarithromycin] Hives  . Influenza A (H1n1) Monoval Vac Other (See Comments)    Pt states that she was told by her MD not to get the influenza vaccine.    . Morphine Other (See Comments)    Reaction:  Dizziness and confusion   . Pyridium [Phenazopyridine Hcl] Other (See Comments)    Reaction:  Unknown   . Ceftriaxone Anxiety  . Latex Rash  . Prednisone Rash  . Tape Rash    REVIEW OF SYSTEMS:   Review of Systems  Constitutional: Positive for malaise/fatigue.  Negative for chills and fever.  HENT: Negative for sore throat.   Eyes: Negative for blurred vision and double vision.  Respiratory: Positive for cough and shortness of breath. Negative for hemoptysis, sputum production, wheezing and stridor.   Cardiovascular: Positive for leg swelling. Negative for chest pain, palpitations and orthopnea.  Gastrointestinal: Negative for abdominal pain, blood in stool, diarrhea, melena, nausea and vomiting.  Genitourinary: Negative for dysuria, flank pain and hematuria.  Musculoskeletal: Negative for back pain and joint pain.  Skin: Negative for rash.  Neurological: Negative for dizziness, sensory change, focal weakness, seizures, loss of consciousness, weakness and headaches.  Endo/Heme/Allergies: Negative for polydipsia.  Psychiatric/Behavioral: Negative for depression. The patient is not  nervous/anxious.     MEDICATIONS AT HOME:   Prior to Admission medications   Medication Sig Start Date End Date Taking? Authorizing Provider  acetaminophen (TYLENOL) 325 MG tablet Take 2 tablets (650 mg total) by mouth every 6 (six) hours as needed for mild pain (or Fever >/= 101). 04/26/18   Loletha Grayer, MD  acetaminophen (TYLENOL) 650 MG CR tablet Take 650 mg by mouth every 6 (six) hours as needed for pain.    [provider]  acidophilus (RISAQUAD) CAPS capsule Take 1 capsule by mouth at bedtime.    [provider]  aspirin EC 81 MG EC tablet Take 1 tablet (81 mg total) by mouth daily. 04/26/18   Loletha Grayer, MD  citalopram (CELEXA) 10 MG tablet Take 1 tablet (10 mg total) by mouth daily. 02/04/17   Loletha Grayer, MD  docusate sodium (COLACE) 100 MG capsule Take 100 mg by mouth 2 (two) times daily.     [provider]  feeding supplement, ENSURE ENLIVE, (ENSURE ENLIVE) LIQD Take 237 mLs by mouth 3 (three) times daily between meals. Patient not taking: Reported on 04/22/2018 10/26/16   Epifanio Lesches, MD  furosemide (LASIX) 40 MG tablet Take 1 tablet (40 mg total) by mouth 2 (two) times daily. 02/04/17   Loletha Grayer, MD  hydrALAZINE (APRESOLINE) 50 MG tablet Take 1 tablet (50 mg total) by mouth every 8 (eight) hours. 04/26/18   Loletha Grayer, MD  insulin aspart (NOVOLOG) 100 UNIT/ML injection Inject 4 Units into the skin 3 (three) times daily with meals. 04/26/18   Loletha Grayer, MD  insulin glargine (LANTUS) 100 UNIT/ML injection Inject 0.43 mLs (43 Units total) into the skin at bedtime. Patient not taking: Reported on 04/22/2018 02/04/17   Loletha Grayer, MD  ipratropium-albuterol (DUONEB) 0.5-2.5 (3) MG/3ML SOLN Take 3 mLs by nebulization 3 (three) times daily. Patient not taking: Reported on 04/22/2018 02/04/17   Loletha Grayer, MD  iron polysaccharides (NIFEREX) 150 MG capsule Take 150 mg by mouth 3 (three) times daily.     [provider]  lamoTRIgine (LAMICTAL) 25 MG tablet Take 1 tablet (25 mg total) by mouth daily. 04/26/18   Loletha Grayer, MD  levETIRAcetam (KEPPRA) 250 MG tablet Take 1 tablet (250 mg total) by mouth 2 (two) times daily. 10/26/16   Epifanio Lesches, MD  loratadine (CLARITIN) 10 MG tablet Take 10 mg by mouth daily.    [provider]  Melatonin 3 MG TABS Take 6 mg by mouth at bedtime.    [provider]  metoprolol (LOPRESSOR) 50 MG tablet Take 50 mg by mouth 2 (two) times daily.    [provider]  nitrofurantoin (MACRODANTIN) 100 MG capsule Take 1 capsule (100 mg total) by mouth daily. 02/06/18   [name], MD  OLANZapine zydis (  ZYPREXA) 10 MG disintegrating tablet Take 1 tablet (10 mg total) by mouth at bedtime. 04/26/18   Loletha Grayer, MD  pantoprazole (PROTONIX) 40 MG tablet Take 40 mg by mouth 2 (two) times daily. 02/09/17   [provider]  polyethylene glycol (MIRALAX / GLYCOLAX) packet Take 17 g by mouth daily. 04/26/18   Loletha Grayer, MD  potassium chloride SA (K-DUR,KLOR-CON) 20 MEQ tablet Take 1 tablet (20 mEq total) by mouth 2 (two) times daily. 04/26/18   Loletha Grayer, MD  senna (SENOKOT) 8.6 MG TABS tablet Take 1 tablet (8.6 mg total) by mouth daily. 09/30/16   Theodoro Grist, MD      VITAL SIGNS:  Blood pressure (!) 174/63, pulse 77, temperature (!) 97.5 F (36.4 C), temperature source Oral, resp. rate 17, SpO2 (!) 89 %.  PHYSICAL EXAMINATION:  Physical Exam  GENERAL:  77 y.o.-year-old patient lying in the bed with no acute distress.  Morbid obesity. EYES: Pupils equal, round, reactive to light and accommodation. No scleral icterus. Extraocular muscles intact.  HEENT: Head atraumatic, normocephalic. Oropharynx and nasopharynx clear.  NECK:  Supple, no jugular venous distention. No thyroid enlargement, no tenderness.  LUNGS: Normal breath sounds bilaterally, no wheezing, bilateral basilar rales, no rhonchi or crepitation.  No use of accessory muscles of respiration.  CARDIOVASCULAR: S1, S2 bradycardia. No murmurs, rubs, or gallops.  ABDOMEN: Soft, nontender, nondistended. Bowel sounds present. No organomegaly or mass.  EXTREMITIES: No cyanosis, or clubbing.  Bilateral leg edema. NEUROLOGIC: Cranial nerves II through XII are intact. Muscle strength 4/5 in all extremities. Sensation intact. Gait not checked.  PSYCHIATRIC: The patient is alert and oriented x 3.  SKIN: No obvious rash, lesion, or ulcer.   LABORATORY PANEL:   CBC Recent Labs  Lab 07/04/18 1908  WBC 8.8  HGB 8.0*  HCT 25.1*  PLT 339   ------------------------------------------------------------------------------------------------------------------  Chemistries  Recent Labs  Lab 07/04/18 1908  NA 132*  K 7.3*  CL 105  CO2 13*  GLUCOSE 327*  BUN 75*  CREATININE 5.11*  CALCIUM 8.9  AST 85*  ALT 61*  ALKPHOS 78  BILITOT 0.4   ------------------------------------------------------------------------------------------------------------------  Cardiac Enzymes Recent Labs  Lab 07/04/18 1908  TROPONINI <0.03   ------------------------------------------------------------------------------------------------------------------  RADIOLOGY:  Ct Head Wo Contrast  Result Date: 07/04/2018 CLINICAL DATA:  Weakness and nausea EXAM: CT HEAD WITHOUT CONTRAST TECHNIQUE: Contiguous axial images were obtained from the base of the skull through the vertex without intravenous contrast. 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 No acute intracranial hemorrhage, intra-axial mass nor extra-axial fluid collections. No edema or midline shift. No hydrocephalus. Midline fourth ventricle and basal cisterns without effacement. Intact brainstem and cerebellum. Vascular: No hyperdense vessel sign.  No unexpected calcifications. Skull: No acute skull fracture or suspicious osseous  lesions. Sinuses/Orbits: Intact orbits and globes.  Clear paranasal sinuses. Other: Clear mastoids. IMPRESSION: No acute intracranial abnormality. 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 Electronically Signed   By: Ashley Royalty M.D.   On: 07/04/2018 20:12   Dg Chest Portable 1 View  Result Date: 07/04/2018 CLINICAL DATA:  Weakness, nausea, bradycardia, and hypotension. EXAM: PORTABLE CHEST 1 VIEW COMPARISON:  04/22/2018 FINDINGS: Stable mild-to-moderate cardiomegaly. Aortic atherosclerosis. No evidence of acute infiltrate or edema. No evidence of pleural effusion. IMPRESSION: Stable cardiomegaly.  No active lung disease. Electronically Signed   By: Earle Gell M.D.   On: 07/04/2018 20:10      IMPRESSION AND PLAN:  Hyperkalemia with bradycardia. The patient will be admitted to stepdown unit. The patient is being treated with calcium gluconate, bicarb, NovoLog and D50.  Dr. Juleen China is reviewing the chart and will decide dialysis or not.  Hold home potassium.  Acute renal failure on CKD. Hold Lasix, follow-up with Dr. Juleen China for possible hemodialysis.  Acute respiratory failure with hypoxia due to acute on chronic diastolic CHF ejection fraction 70%. Hold Lasix at this time due to renal failure.  Hyponatremia.  Follow-up BMP.  Hold Lasix.  Discussed with ED physician and eating intensivist Dr. Madalyn Rob.  All the records are reviewed and case discussed with ED provider. Management plans discussed with the patient, family and they are in agreement.  CODE STATUS: Full code.  TOTAL TIME TAKING CARE OF THIS PATIENT: 46 minutes.    Demetrios Loll M.D on 07/04/2018 at 8:44 PM  Between 7am to 6pm - Pager - [phone]  After 6pm go to [email or web address] - Proofreader  [facility]  Office  [phone]  CC: Primary care physician; Leonel Ramsay, MD   Note: This dictation was prepared with Dragon dictation along with smaller phrase  technology. Any transcriptional errors that result from this process are unin

## 2018-07-04 NOTE — ED Notes (Signed)
Date and time results received: 07/04/18 2010   Test: potassium  Critical Value: 7.3  Name of Provider Notified: Dr. Jacqualine Code

## 2018-07-04 NOTE — Progress Notes (Signed)
Advanced Care Plan.  Purpose of Encounter: CODE STATUS. Parties in Attendance: The patient, RN and me. Patient's Decisional Capacity: Yes. Medical Story: Carolyn Valdez  is a 77 y.o. female with a known history of CHF, diabetes, hypertension, hyperlipidemia, breast cancer, recurrent UTI and incontinence etc. she is being admitted for hyperkalemia, acute renal failure on CKD, acute respiratory failure with hypoxia due to acute on chronic diastolic CHF.  I discussed with patient about her current condition, high risk for cardiac arrest due to hyperkalemia, poor prognosis and CODE STATUS.  The patient was seen DNR status.  But she changed her mind and wants to be resuscitated and intubated to get her back if necessary. Plan:  Code Status: Full code. Time spent discussing advance care planning: 18 minutes.

## 2018-07-04 NOTE — Procedures (Signed)
Hemodialysis Catheter Insertion Procedure Note JADEYN HARGETT [id number] Aug 08, 1941  Procedure: Insertion of Hemodialysis Catheter Indications: Hemodialysis  Procedure Details Consent: Risks of procedure as well as the alternatives and risks of each were explained to the (patient/caregiver).  Consent for procedure obtained.  Time Out: Verified patient identification, verified procedure, site/side was marked, verified correct patient position, special equipment/implants available, medications/allergies/relevent history reviewed, required imaging and test results available.  Performed  Maximum sterile technique was used including antiseptics, cap, gloves, gown, hand hygiene, mask and sheet.  Skin prep: Chlorhexidine; local anesthetic administered  A Trialysis HD catheter was placed in the left femoral vein due to patient being a dialysis patient and emergent situation using the Seldinger technique.  Evaluation Blood flow good Complications: No apparent complications Patient did tolerate procedure well. Chest X-ray ordered to verify placement.  CXR: Not applicable, placed in left femoral vein.   Procedure performed with ultrasound guidance for real time vessel cannulation.       Darel Hong, AGACNP-BC [facility] Pager: [phone]  07/04/2018, 11:12 PM

## 2018-07-05 ENCOUNTER — Inpatient Hospital Stay (HOSPITAL_COMMUNITY)
Admit: 2018-07-05 | Discharge: 2018-07-05 | Disposition: A | Discharge status: Home / Self Care | Payer: Medicare Other | Attending: Pulmonary Disease | Admitting: Pulmonary Disease

## 2018-07-05 ENCOUNTER — Inpatient Hospital Stay: Payer: Medicare Other

## 2018-07-05 DIAGNOSIS — J9601 Acute respiratory failure with hypoxia: Secondary | ICD-10-CM

## 2018-07-05 DIAGNOSIS — I361 Nonrheumatic tricuspid (valve) insufficiency: Secondary | ICD-10-CM

## 2018-07-05 LAB — RENAL FUNCTION PANEL
ALBUMIN: 3.6 g/dL (ref 3.5–5.0)
ALBUMIN: 3.5 g/dL (ref 3.5–5.0)
ANION GAP: 16 — AB (ref 5–15)
ANION GAP: 10 (ref 5–15)
Albumin: 3.6 g/dL (ref 3.5–5.0)
Albumin: 3.4 g/dL — ABNORMAL LOW (ref 3.5–5.0)
Anion gap: 16 — ABNORMAL HIGH (ref 5–15)
Anion gap: 8 (ref 5–15)
BUN: 79 mg/dL — ABNORMAL HIGH (ref 8–23)
BUN: 82 mg/dL — AB (ref 8–23)
BUN: 67 mg/dL — ABNORMAL HIGH (ref 8–23)
BUN: 52 mg/dL — AB (ref 8–23)
CALCIUM: 8.8 mg/dL — AB (ref 8.9–10.3)
CALCIUM: 8.8 mg/dL — AB (ref 8.9–10.3)
CHLORIDE: 105 mmol/L (ref 98–111)
CHLORIDE: 105 mmol/L (ref 98–111)
CO2: 15 mmol/L — AB (ref 22–32)
CO2: 16 mmol/L — ABNORMAL LOW (ref 22–32)
CO2: 23 mmol/L (ref 22–32)
CO2: 25 mmol/L (ref 22–32)
CREATININE: 5.14 mg/dL — AB (ref 0.44–1.00)
CREATININE: 4.1 mg/dL — AB (ref 0.44–1.00)
CREATININE: 3.15 mg/dL — AB (ref 0.44–1.00)
Calcium: 9 mg/dL (ref 8.9–10.3)
Calcium: 8.5 mg/dL — ABNORMAL LOW (ref 8.9–10.3)
Chloride: 106 mmol/L (ref 98–111)
Chloride: 104 mmol/L (ref 98–111)
Creatinine, Ser: 5.05 mg/dL — ABNORMAL HIGH (ref 0.44–1.00)
GFR calc Af Amer: 16 mL/min — ABNORMAL LOW (ref >60)
GFR calc non Af Amer: 8 mL/min — ABNORMAL LOW (ref >60)
GFR calc non Af Amer: 10 mL/min — ABNORMAL LOW (ref >60)
GFR calc non Af Amer: 13 mL/min — ABNORMAL LOW (ref >60)
GFR, EST AFRICAN AMERICAN: 9 mL/min — AB (ref >60)
GFR, EST AFRICAN AMERICAN: 9 mL/min — AB (ref >60)
GFR, EST AFRICAN AMERICAN: 11 mL/min — AB (ref >60)
GFR, EST NON AFRICAN AMERICAN: 7 mL/min — AB (ref >60)
GLUCOSE: 233 mg/dL — AB (ref 70–99)
GLUCOSE: 251 mg/dL — AB (ref 70–99)
Glucose, Bld: 223 mg/dL — ABNORMAL HIGH (ref 70–99)
Glucose, Bld: 170 mg/dL — ABNORMAL HIGH (ref 70–99)
PHOSPHORUS: 8.2 mg/dL — AB (ref 2.5–4.6)
PHOSPHORUS: 6.3 mg/dL — AB (ref 2.5–4.6)
POTASSIUM: 4.2 mmol/L (ref 3.5–5.1)
Phosphorus: 8.3 mg/dL — ABNORMAL HIGH (ref 2.5–4.6)
Phosphorus: 4.7 mg/dL — ABNORMAL HIGH (ref 2.5–4.6)
Potassium: 7.3 mmol/L (ref 3.5–5.1)
Potassium: 5.5 mmol/L — ABNORMAL HIGH (ref 3.5–5.1)
Sodium: 137 mmol/L (ref 135–145)
Sodium: 137 mmol/L (ref 135–145)
Sodium: 137 mmol/L (ref 135–145)
Sodium: 138 mmol/L (ref 135–145)

## 2018-07-05 LAB — BASIC METABOLIC PANEL
ANION GAP: 14 (ref 5–15)
BUN: 80 mg/dL — AB (ref 8–23)
CO2: 16 mmol/L — ABNORMAL LOW (ref 22–32)
Calcium: 8.6 mg/dL — ABNORMAL LOW (ref 8.9–10.3)
Chloride: 105 mmol/L (ref 98–111)
Creatinine, Ser: 5.26 mg/dL — ABNORMAL HIGH (ref 0.44–1.00)
GFR calc Af Amer: 8 mL/min — ABNORMAL LOW (ref >60)
GFR calc non Af Amer: 7 mL/min — ABNORMAL LOW (ref >60)
Glucose, Bld: 239 mg/dL — ABNORMAL HIGH (ref 70–99)
SODIUM: 135 mmol/L (ref 135–145)

## 2018-07-05 LAB — GLUCOSE, CAPILLARY
GLUCOSE-CAPILLARY: 215 mg/dL — AB (ref 70–99)
Glucose-Capillary: 224 mg/dL — ABNORMAL HIGH (ref 70–99)
Glucose-Capillary: 153 mg/dL — ABNORMAL HIGH (ref 70–99)
Glucose-Capillary: 143 mg/dL — ABNORMAL HIGH (ref 70–99)

## 2018-07-05 LAB — ECHOCARDIOGRAM COMPLETE
HEIGHTINCHES: 67 in
Weight: 4299.85 oz

## 2018-07-05 LAB — CBC
HEMATOCRIT: 22.5 % — AB (ref 35.0–47.0)
HEMOGLOBIN: 7.3 g/dL — AB (ref 12.0–16.0)
MCH: 30.1 pg (ref 26.0–34.0)
MCHC: 32.3 g/dL (ref 32.0–36.0)
MCV: 93.4 fL (ref 80.0–100.0)
Platelets: 301 10*3/uL (ref 150–440)
RBC: 2.41 MIL/uL — ABNORMAL LOW (ref 3.80–5.20)
RDW: 18.6 % — AB (ref 11.5–14.5)
WBC: 10 10*3/uL (ref 3.6–11.0)

## 2018-07-05 LAB — MAGNESIUM
MAGNESIUM: 2.9 mg/dL — AB (ref 1.7–2.4)
MAGNESIUM: 2.5 mg/dL — AB (ref 1.7–2.4)
MAGNESIUM: 2.2 mg/dL (ref 1.7–2.4)
Magnesium: 2.4 mg/dL (ref 1.7–2.4)

## 2018-07-05 LAB — HEPARIN LEVEL (UNFRACTIONATED)
Heparin Unfractionated: 0.14 IU/mL — ABNORMAL LOW (ref 0.30–0.70)
Heparin Unfractionated: 0.28 IU/mL — ABNORMAL LOW (ref 0.30–0.70)

## 2018-07-05 LAB — APTT
aPTT: 32 seconds (ref 24–36)
aPTT: 43 seconds — ABNORMAL HIGH (ref 24–36)

## 2018-07-05 LAB — PROTIME-INR
INR: 1.56
PROTHROMBIN TIME: 18.5 s — AB (ref 11.4–15.2)

## 2018-07-05 MED ORDER — ORAL CARE MOUTH RINSE
15.0000 mL | Freq: Two times a day (BID) | OROMUCOSAL | Status: DC
  Administered 2018-07-05 – 2018-07-13 (×12): 15 mL via OROMUCOSAL

## 2018-07-05 MED ORDER — ALTEPLASE 2 MG IJ SOLR
2.0000 mg | Freq: Once | INTRAMUSCULAR | Status: AC
  Administered 2018-07-05: 2 mg
  Filled 2018-07-05: qty 2

## 2018-07-05 MED ORDER — HEPARIN (PORCINE) IN NACL 100-0.45 UNIT/ML-% IJ SOLN
1400.0000 [IU]/h | INTRAMUSCULAR | Status: DC
  Administered 2018-07-05: 1000 [IU]/h via INTRAVENOUS
  Administered 2018-07-06: 01:00:00 1400 [IU]/h via INTRAVENOUS
  Filled 2018-07-05 (×2): qty 250

## 2018-07-05 MED ORDER — SODIUM CHLORIDE 0.9 % IV SOLN
1.0000 g | Freq: Once | INTRAVENOUS | Status: AC
  Administered 2018-07-05: 1 g via INTRAVENOUS
  Filled 2018-07-05: qty 10

## 2018-07-05 MED ORDER — SODIUM BICARBONATE 8.4 % IV SOLN
50.0000 meq | Freq: Once | INTRAVENOUS | Status: AC
  Administered 2018-07-05: 50 meq via INTRAVENOUS
  Filled 2018-07-05: qty 100

## 2018-07-05 MED ORDER — INSULIN REGULAR HUMAN 100 UNIT/ML IJ SOLN
10.0000 [IU] | Freq: Once | INTRAMUSCULAR | Status: AC
  Administered 2018-07-05: 10 [IU] via INTRAVENOUS

## 2018-07-05 MED ORDER — INSULIN ASPART 100 UNIT/ML ~~LOC~~ SOLN: 10.0000 [IU] | Freq: Once | SUBCUTANEOUS | Status: DC

## 2018-07-05 MED ORDER — DEXTROSE 50 % IV SOLN
25.0000 mL | Freq: Once | INTRAVENOUS | Status: AC
  Administered 2018-07-05: 25 mL via INTRAVENOUS
  Filled 2018-07-05: qty 50

## 2018-07-05 MED ORDER — SODIUM BICARBONATE 8.4 % IV SOLN
50.0000 meq | Freq: Once | INTRAVENOUS | Status: AC
  Administered 2018-07-05: 50 meq via INTRAVENOUS
  Filled 2018-07-05: qty 50

## 2018-07-05 MED ORDER — HEPARIN BOLUS VIA INFUSION
2000.0000 [IU] | Freq: Once | INTRAVENOUS | Status: AC
  Administered 2018-07-05: 2000 [IU] via INTRAVENOUS
  Filled 2018-07-05: qty 2000

## 2018-07-05 MED ORDER — INSULIN REGULAR HUMAN 100 UNIT/ML IJ SOLN
10.0000 [IU] | Freq: Once | INTRAMUSCULAR | Status: AC
  Administered 2018-07-05: 10 [IU] via INTRAVENOUS
  Filled 2018-07-05: qty 0.1

## 2018-07-05 MED ORDER — INSULIN REGULAR HUMAN 100 UNIT/ML IJ SOLN
10.0000 [IU] | Freq: Once | INTRAMUSCULAR | Status: DC
  Filled 2018-07-05: qty 0.1

## 2018-07-05 MED ORDER — ALTEPLASE 2 MG IJ SOLR
2.0000 mg | Freq: Once | INTRAMUSCULAR | Status: AC
  Administered 2018-07-05: 2 mg

## 2018-07-05 MED ORDER — PUREFLOW DIALYSIS SOLUTION
INTRAVENOUS | Status: DC
  Administered 2018-07-05 (×2): 3 via INTRAVENOUS_CENTRAL

## 2018-07-05 MED ORDER — SODIUM POLYSTYRENE SULFONATE 15 GM/60ML PO SUSP
30.0000 g | Freq: Once | ORAL | Status: AC
  Administered 2018-07-05: 30 g via ORAL
  Filled 2018-07-05: qty 120

## 2018-07-05 MED ORDER — HEPARIN SODIUM (PORCINE) 1000 UNIT/ML DIALYSIS
1000.0000 [IU] | INTRAMUSCULAR | Status: DC | PRN
  Filled 2018-07-05: qty 6

## 2018-07-05 MED ORDER — SODIUM BICARBONATE 8.4 % IV SOLN
25.0000 meq | Freq: Once | INTRAVENOUS | Status: AC
  Administered 2018-07-05: 25 meq via INTRAVENOUS

## 2018-07-05 MED ORDER — STERILE WATER FOR INJECTION IJ SOLN
INTRAMUSCULAR | Status: AC
  Administered 2018-07-05: 4.4 mL
  Filled 2018-07-05: qty 10

## 2018-07-05 NOTE — Progress Notes (Signed)
Able to place left femoral Temporary Trialysis catheter.  Repeat BMP with K >7.5,  CO2 15, Creatinine 5.25, and VBG with pH 7.12 / CO2 41.  Pt given 2 amps bicarb, 1g Ca Gluconate, 10 units IV insulin, amp d50, and Kayexalate STAT.  Spoke with Dr. Juleen China, orders placed to begin pt on CRRT, with 0 K bath for the first two hours with follow up BMP to evaluate dialysate.

## 2018-07-05 NOTE — Consult Note (Signed)
Carolyn Valdez is a 77 y.o. female  [id number]  Primary Cardiologist: Dr. Humphrey Rolls Reason for Consultation: hyperkalemia, bradycardic junctional rhythm  HPI: 77yo female with a past medical history of diastolic CHF, SVT, HLP, and diabetes called EMS when she was feeling fatigued and short of breath for several days. She had a syncopal episode with bradycardia while EMS was present. No chest pain. She was found to have acute kidney injury with associated rise in creatinine to >5.0 (baseline 1.09 02/03/18) and hyperkalemia K>7.5. She was placed on CCRT and K has decreased to 5.5 as of this morning and heart rate is no longer bradycardic.    Review of Systems: Currently feeling fatigued but No chest pain. Remains mildly short of breath.  Past Medical History:  Diagnosis Date  . Anxiety   . Breast cancer ([place]) 1997   right breast mastectomy  . CHF (congestive heart failure) ([place])   . Diabetes mellitus without complication ([place])   . DJD (degenerative joint disease)   . Dysuria   . H/O total knee replacement    right  . HTN (hypertension)   . Hypercholesteremia   . Kidney stone   . Obesity   . Recurrent urinary tract infection   . SVT (supraventricular tachycardia) ([place])   . Urinary incontinence   . Vaginal atrophy   . Yeast vaginitis     Medications Prior to Admission  Medication Sig Dispense Refill  . acetaminophen (TYLENOL) 325 MG tablet Take 2 tablets (650 mg total) by mouth every 6 (six) hours as needed for mild pain (or Fever >/= 101).    Marland Kitchen acetaminophen (TYLENOL) 650 MG CR tablet Take 650 mg by mouth every 6 (six) hours as needed for pain.    Marland Kitchen acidophilus (RISAQUAD) CAPS capsule Take 1 capsule by mouth at bedtime.    Marland Kitchen aspirin EC 81 MG EC tablet Take 1 tablet (81 mg total) by mouth daily. 30 tablet 0  . citalopram (CELEXA) 10 MG tablet Take 1 tablet (10 mg total) by mouth daily. 30 tablet 0  . docusate sodium (COLACE) 100 MG capsule Take 100 mg by mouth 2 (two) times daily.      . feeding supplement, ENSURE ENLIVE, (ENSURE ENLIVE) LIQD Take 237 mLs by mouth 3 (three) times daily between meals. (Patient not taking: Reported on 04/22/2018) 237 mL 12  . furosemide (LASIX) 40 MG tablet Take 1 tablet (40 mg total) by mouth 2 (two) times daily. 60 tablet 0  . hydrALAZINE (APRESOLINE) 50 MG tablet Take 1 tablet (50 mg total) by mouth every 8 (eight) hours. 90 tablet 0  . insulin aspart (NOVOLOG) 100 UNIT/ML injection Inject 4 Units into the skin 3 (three) times daily with meals. 10 mL 0  . insulin glargine (LANTUS) 100 UNIT/ML injection Inject 0.43 mLs (43 Units total) into the skin at bedtime. (Patient not taking: Reported on 04/22/2018) 15 mL 0  . ipratropium-albuterol (DUONEB) 0.5-2.5 (3) MG/3ML SOLN Take 3 mLs by nebulization 3 (three) times daily. (Patient not taking: Reported on 04/22/2018) 360 mL 0  . iron polysaccharides (NIFEREX) 150 MG capsule Take 150 mg by mouth 3 (three) times daily.     Marland Kitchen lamoTRIgine (LAMICTAL) 25 MG tablet Take 1 tablet (25 mg total) by mouth daily. 30 tablet 0  . levETIRAcetam (KEPPRA) 250 MG tablet Take 1 tablet (250 mg total) by mouth 2 (two) times daily. 30 tablet 0  . loratadine (CLARITIN) 10 MG tablet Take 10 mg by mouth daily.    Marland Kitchen  Melatonin 3 MG TABS Take 6 mg by mouth at bedtime.    . metoprolol (LOPRESSOR) 50 MG tablet Take 50 mg by mouth 2 (two) times daily.    . nitrofurantoin (MACRODANTIN) 100 MG capsule Take 1 capsule (100 mg total) by mouth daily. 30 capsule 11  . OLANZapine zydis (ZYPREXA) 10 MG disintegrating tablet Take 1 tablet (10 mg total) by mouth at bedtime. 30 tablet 0  . pantoprazole (PROTONIX) 40 MG tablet Take 40 mg by mouth 2 (two) times daily.    . polyethylene glycol (MIRALAX / GLYCOLAX) packet Take 17 g by mouth daily. 14 each 0  . potassium chloride SA (K-DUR,KLOR-CON) 20 MEQ tablet Take 1 tablet (20 mEq total) by mouth 2 (two) times daily. 60 tablet 0  . senna (SENOKOT) 8.6 MG TABS tablet Take 1 tablet (8.6 mg total) by  mouth daily. 120 each 0     . acidophilus  1 capsule Oral QHS  . aspirin EC  81 mg Oral Daily  . citalopram  10 mg Oral Daily  . docusate sodium  100 mg Oral BID  . insulin aspart  0-5 Units Subcutaneous QHS  . insulin aspart  0-9 Units Subcutaneous TID WC  . iron polysaccharides  150 mg Oral TID  . lamoTRIgine  25 mg Oral Daily  . levETIRAcetam  250 mg Oral BID  . loratadine  10 mg Oral Daily  . mouth rinse  15 mL Mouth Rinse BID  . Melatonin  5 mg Oral QHS  . OLANZapine zydis  10 mg Oral QHS  . pantoprazole  40 mg Oral BID  . polyethylene glycol  17 g Oral Daily  . senna  1 tablet Oral Daily  . sodium chloride flush  3 mL Intravenous Q12H    Infusions: . sodium chloride    . heparin 1,000 Units/hr (07/05/18 0800)  . pureflow 3 each (07/05/18 0123)    Allergies  Allergen Reactions  . Sulfa Antibiotics Hives  . Biaxin [Clarithromycin] Hives  . Influenza A (H1n1) Monoval Vac Other (See Comments)    Pt states that she was told by her MD not to get the influenza vaccine.    . Morphine Other (See Comments)    Reaction:  Dizziness and confusion   . Pyridium [Phenazopyridine Hcl] Other (See Comments)    Reaction:  Unknown   . Ceftriaxone Anxiety  . Latex Rash  . Prednisone Rash  . Tape Rash    Social History   Socioeconomic History  . Marital status: Widowed    Spouse name: Not on file  . Number of children: Not on file  . Years of education: Not on file  . Highest education level: Not on file  Occupational History  . Not on file  Social Needs  . Financial resource strain: Not on file  . Food insecurity:    Worry: Not on file    Inability: Not on file  . Transportation needs:    Medical: Not on file    Non-medical: Not on file  Tobacco Use  . Smoking status: Former Research scientist (life sciences)  . Smokeless tobacco: Former Systems developer    Quit date: 10/29/1995  Substance and Sexual Activity  . Alcohol use: No    Alcohol/week: 0.0 standard drinks  . Drug use: No  . Sexual activity:  Never  Lifestyle  . Physical activity:    Days per week: Not on file    Minutes per session: Not on file  . Stress: Not on file  Relationships  . Social connections:    Talks on phone: Not on file    Gets together: Not on file    Attends religious service: Not on file    Active member of club or organization: Not on file    Attends meetings of clubs or organizations: Not on file    Relationship status: Not on file  . Intimate partner violence:    Fear of current or ex partner: Not on file    Emotionally abused: Not on file    Physically abused: Not on file    Forced sexual activity: Not on file  Other Topics Concern  . Not on file  Social History Narrative  . Not on file    Family History  Problem Relation Age of Onset  . Lung cancer Father   . Hematuria Mother   . Lung cancer Mother   . Kidney disease Neg Hx   . Bladder Cancer Neg Hx   . Breast cancer Neg Hx     PHYSICAL EXAM: Vitals:   07/05/18 0800 07/05/18 0900  BP: (!) 153/46 (!) 163/152  Pulse: 62 73  Resp: 19 19  Temp:    SpO2: 100% 98%     Intake/Output Summary (Last 24 hours) at 07/05/2018 1133 Last data filed at 07/05/2018 1033 Gross per 24 hour  Intake 476.77 ml  Output 369 ml  Net 107.77 ml    General:  Acutely ill elderly female lying in bed, appears pale, weak. HEENT: normal Neck: supple. no JVD. Carotids 2+ bilat; no bruits. No lymphadenopathy or thryomegaly appreciated. Cor: PMI nondisplaced. Regular rate & rhythm. No rubs, gallops or murmurs. Lungs: clear Abdomen: soft, nontender, nondistended. No hepatosplenomegaly. No bruits or masses. Good bowel sounds. Extremities: no cyanosis, clubbing, rash, edema Neuro: Lethargic, very sleepy.  ECG: Junctional rhythm 68bpm with nonspecific IVCD  Results for orders placed or performed during the hospital encounter of 07/04/18 (from the past 24 hour(s))  Basic metabolic panel     Status: Abnormal   Collection Time: 07/04/18  7:08 PM  Result Value  Ref Range   Sodium 132 (L) 135 - 145 mmol/L   Potassium 7.3 (HH) 3.5 - 5.1 mmol/L   Chloride 105 98 - 111 mmol/L   CO2 13 (L) 22 - 32 mmol/L   Glucose, Bld 327 (H) 70 - 99 mg/dL   BUN 75 (H) 8 - 23 mg/dL   Creatinine, Ser 5.11 (H) 0.44 - 1.00 mg/dL   Calcium 8.9 8.9 - 10.3 mg/dL   GFR calc non Af Amer 7 (L) >60 mL/min   GFR calc Af Amer 9 (L) >60 mL/min   Anion gap 14 5 - 15  CBC     Status: Abnormal   Collection Time: 07/04/18  7:08 PM  Result Value Ref Range   WBC 8.8 3.6 - 11.0 K/uL   RBC 2.67 (L) 3.80 - 5.20 MIL/uL   Hemoglobin 8.0 (L) 12.0 - 16.0 g/dL   HCT 25.1 (L) 35.0 - 47.0 %   MCV 93.9 80.0 - 100.0 fL   MCH 29.8 26.0 - 34.0 pg   MCHC 31.8 (L) 32.0 - 36.0 g/dL   RDW 18.8 (H) 11.5 - 14.5 %   Platelets 339 150 - 440 K/uL  Brain natriuretic peptide     Status: Abnormal   Collection Time: 07/04/18  7:08 PM  Result Value Ref Range   B Natriuretic Peptide 3,147.0 (H) 0.0 - 100.0 pg/mL  Hepatic function panel     Status:  Abnormal   Collection Time: 07/04/18  7:08 PM  Result Value Ref Range   Total Protein 7.0 6.5 - 8.1 g/dL   Albumin 3.8 3.5 - 5.0 g/dL   AST 85 (H) 15 - 41 U/L   ALT 61 (H) 0 - 44 U/L   Alkaline Phosphatase 78 38 - 126 U/L   Total Bilirubin 0.4 0.3 - 1.2 mg/dL   Bilirubin, Direct <0.1 0.0 - 0.2 mg/dL   Indirect Bilirubin NOT CALCULATED 0.3 - 0.9 mg/dL  Lipase, blood     Status: Abnormal   Collection Time: 07/04/18  7:08 PM  Result Value Ref Range   Lipase 63 (H) 11 - 51 U/L  Troponin I     Status: None   Collection Time: 07/04/18  7:08 PM  Result Value Ref Range   Troponin I <0.03 <0.03 ng/mL  Urinalysis, Complete w Microscopic     Status: Abnormal   Collection Time: 07/04/18  8:00 PM  Result Value Ref Range   Color, Urine AMBER (A) YELLOW   APPearance CLOUDY (A) CLEAR   Specific Gravity, Urine 1.018 1.005 - 1.030   pH 5.0 5.0 - 8.0   Glucose, UA NEGATIVE NEGATIVE mg/dL   Hgb urine dipstick NEGATIVE NEGATIVE   Bilirubin Urine NEGATIVE NEGATIVE    Ketones, ur NEGATIVE NEGATIVE mg/dL   Protein, ur 100 (A) NEGATIVE mg/dL   Nitrite NEGATIVE NEGATIVE   Leukocytes, UA MODERATE (A) NEGATIVE   RBC / HPF 21-50 0 - 5 RBC/hpf   WBC, UA >50 (H) 0 - 5 WBC/hpf   Bacteria, UA NONE SEEN NONE SEEN   Squamous Epithelial / LPF 6-10 0 - 5   WBC Clumps PRESENT    Mucus PRESENT    Amorphous Crystal PRESENT   Glucose, capillary     Status: Abnormal   Collection Time: 07/04/18  8:19 PM  Result Value Ref Range   Glucose-Capillary 255 (H) 70 - 99 mg/dL  Glucose, capillary     Status: Abnormal   Collection Time: 07/04/18  9:05 PM  Result Value Ref Range   Glucose-Capillary 271 (H) 70 - 99 mg/dL  Glucose, capillary     Status: Abnormal   Collection Time: 07/04/18  9:33 PM  Result Value Ref Range   Glucose-Capillary 260 (H) 70 - 99 mg/dL  Glucose, capillary     Status: Abnormal   Collection Time: 07/04/18  9:59 PM  Result Value Ref Range   Glucose-Capillary 216 (H) 70 - 99 mg/dL  MRSA PCR Screening     Status: None   Collection Time: 07/04/18 10:10 PM  Result Value Ref Range   MRSA by PCR NEGATIVE NEGATIVE  CBC     Status: Abnormal   Collection Time: 07/04/18 11:25 PM  Result Value Ref Range   WBC 8.8 3.6 - 11.0 K/uL   RBC 2.49 (L) 3.80 - 5.20 MIL/uL   Hemoglobin 7.4 (L) 12.0 - 16.0 g/dL   HCT 23.0 (L) 35.0 - 47.0 %   MCV 92.3 80.0 - 100.0 fL   MCH 29.9 26.0 - 34.0 pg   MCHC 32.4 32.0 - 36.0 g/dL   RDW 18.6 (H) 11.5 - 14.5 %   Platelets 314 150 - 440 K/uL  Basic metabolic panel     Status: Abnormal   Collection Time: 07/04/18 11:25 PM  Result Value Ref Range   Sodium 135 135 - 145 mmol/L   Potassium >7.5 (HH) 3.5 - 5.1 mmol/L   Chloride 105 98 - 111 mmol/L  CO2 16 (L) 22 - 32 mmol/L   Glucose, Bld 239 (H) 70 - 99 mg/dL   BUN 80 (H) 8 - 23 mg/dL   Creatinine, Ser 5.26 (H) 0.44 - 1.00 mg/dL   Calcium 8.6 (L) 8.9 - 10.3 mg/dL   GFR calc non Af Amer 7 (L) >60 mL/min   GFR calc Af Amer 8 (L) >60 mL/min   Anion gap 14 5 - 15   Magnesium     Status: None   Collection Time: 07/04/18 11:25 PM  Result Value Ref Range   Magnesium 2.4 1.7 - 2.4 mg/dL  Blood gas, venous     Status: Abnormal (Preliminary result)   Collection Time: 07/05/18 12:46 AM  Result Value Ref Range   pH, Ven 7.12 (LL) 7.250 - 7.430   pCO2, Ven 41 (L) 44.0 - 60.0 mmHg   pO2, Ven PENDING 32.0 - 45.0 mmHg   Bicarbonate 13.3 (L) 20.0 - 28.0 mmol/L   Acid-base deficit 14.9 (H) 0.0 - 2.0 mmol/L   O2 Saturation PENDING %   Patient temperature 37.0    Collection site VENOUS    Sample type VENOUS   Renal function     Status: Abnormal   Collection Time: 07/05/18  3:10 AM  Result Value Ref Range   Sodium 137 135 - 145 mmol/L   Potassium 7.3 (HH) 3.5 - 5.1 mmol/L   Chloride 106 98 - 111 mmol/L   CO2 15 (L) 22 - 32 mmol/L   Glucose, Bld 233 (H) 70 - 99 mg/dL   BUN 79 (H) 8 - 23 mg/dL   Creatinine, Ser 5.05 (H) 0.44 - 1.00 mg/dL   Calcium 8.8 (L) 8.9 - 10.3 mg/dL   Phosphorus 8.3 (H) 2.5 - 4.6 mg/dL   Albumin 3.6 3.5 - 5.0 g/dL   GFR calc non Af Amer 8 (L) >60 mL/min   GFR calc Af Amer 9 (L) >60 mL/min   Anion gap 16 (H) 5 - 15  CBC     Status: Abnormal   Collection Time: 07/05/18  4:22 AM  Result Value Ref Range   WBC 10.0 3.6 - 11.0 K/uL   RBC 2.41 (L) 3.80 - 5.20 MIL/uL   Hemoglobin 7.3 (L) 12.0 - 16.0 g/dL   HCT 22.5 (L) 35.0 - 47.0 %   MCV 93.4 80.0 - 100.0 fL   MCH 30.1 26.0 - 34.0 pg   MCHC 32.3 32.0 - 36.0 g/dL   RDW 18.6 (H) 11.5 - 14.5 %   Platelets 301 150 - 440 K/uL  Magnesium     Status: Abnormal   Collection Time: 07/05/18  4:22 AM  Result Value Ref Range   Magnesium 2.9 (H) 1.7 - 2.4 mg/dL  APTT     Status: None   Collection Time: 07/05/18  4:22 AM  Result Value Ref Range   aPTT 32 24 - 36 seconds  Protime-INR     Status: Abnormal   Collection Time: 07/05/18  4:22 AM  Result Value Ref Range   Prothrombin Time 18.5 (H) 11.4 - 15.2 seconds   INR 1.56   Renal function     Status: Abnormal   Collection Time: 07/05/18   4:23 AM  Result Value Ref Range   Sodium 137 135 - 145 mmol/L   Potassium >7.5 (HH) 3.5 - 5.1 mmol/L   Chloride 105 98 - 111 mmol/L   CO2 16 (L) 22 - 32 mmol/L   Glucose, Bld 223 (H) 70 - 99 mg/dL  BUN 82 (H) 8 - 23 mg/dL   Creatinine, Ser 5.14 (H) 0.44 - 1.00 mg/dL   Calcium 9.0 8.9 - 10.3 mg/dL   Phosphorus 8.2 (H) 2.5 - 4.6 mg/dL   Albumin 3.6 3.5 - 5.0 g/dL   GFR calc non Af Amer 7 (L) >60 mL/min   GFR calc Af Amer 9 (L) >60 mL/min   Anion gap 16 (H) 5 - 15  Glucose, capillary     Status: Abnormal   Collection Time: 07/05/18  7:54 AM  Result Value Ref Range   Glucose-Capillary 224 (H) 70 - 99 mg/dL  Renal function     Status: Abnormal   Collection Time: 07/05/18  9:04 AM  Result Value Ref Range   Sodium 137 135 - 145 mmol/L   Potassium 5.5 (H) 3.5 - 5.1 mmol/L   Chloride 104 98 - 111 mmol/L   CO2 23 22 - 32 mmol/L   Glucose, Bld 251 (H) 70 - 99 mg/dL   BUN 67 (H) 8 - 23 mg/dL   Creatinine, Ser 4.10 (H) 0.44 - 1.00 mg/dL   Calcium 8.8 (L) 8.9 - 10.3 mg/dL   Phosphorus 6.3 (H) 2.5 - 4.6 mg/dL   Albumin 3.5 3.5 - 5.0 g/dL   GFR calc non Af Amer 10 (L) >60 mL/min   GFR calc Af Amer 11 (L) >60 mL/min   Anion gap 10 5 - 15  Magnesium     Status: Abnormal   Collection Time: 07/05/18  9:04 AM  Result Value Ref Range   Magnesium 2.5 (H) 1.7 - 2.4 mg/dL   Ct Head Wo Contrast  Result Date: 07/04/2018 CLINICAL DATA:  Weakness and nausea EXAM: CT HEAD WITHOUT CONTRAST TECHNIQUE: Contiguous axial images were obtained from the base of the skull through the vertex without intravenous contrast. 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 No acute intracranial hemorrhage, intra-axial mass nor extra-axial fluid collections. No edema or midline shift. No hydrocephalus. Midline fourth ventricle and basal cisterns without effacement. Intact brainstem and cerebellum. Vascular: No hyperdense vessel sign.  No unexpected  calcifications. Skull: No acute skull fracture or suspicious osseous lesions. Sinuses/Orbits: Intact orbits and globes.  Clear paranasal sinuses. Other: Clear mastoids. IMPRESSION: No acute intracranial abnormality. 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 Electronically Signed   By: Ashley Royalty M.D.   On: 07/04/2018 20:12   US Renal  Result Date: 07/05/2018 CLINICAL DATA:  Acute renal failure EXAM: RENAL ULTRASOUND COMPARISON:  February 01, 2018 FINDINGS: Right Kidney: Length: 10.3 cm. Echogenicity and renal cortical thickness are within normal limits. No mass, perinephric fluid, or hydronephrosis visualized. There is an apparent calculus in the upper pole of the right kidney measuring 1.2 cm in length. No ureterectasis. Left Kidney: Length: 10.5 cm. Echogenicity and renal cortical thickness are within normal limits. No mass, perinephric fluid, or hydronephrosis visualized. No sonographically demonstrable calculus or ureterectasis. Bladder: Decompressed with Foley catheter and cannot be assessed. IMPRESSION: 1.2 cm calculus upper pole right kidney, nonobstructing. Kidneys otherwise appear unremarkable. In particular, no obstructing focus. Renal echogenicity and cortical thickness within normal limits bilaterally. Electronically Signed   By: Lowella Grip III M.D.   On: 07/05/2018 10:54   Dg Chest Portable 1 View  Result Date: 07/04/2018 CLINICAL DATA:  Weakness, nausea, bradycardia, and hypotension. EXAM: PORTABLE CHEST 1 VIEW COMPARISON:  04/22/2018 FINDINGS: Stable mild-to-moderate cardiomegaly. Aortic atherosclerosis. No evidence of acute infiltrate or edema. No evidence of pleural effusion. IMPRESSION:  Stable cardiomegaly.  No active lung disease. Electronically Signed   By: Earle Gell M.D.   On: 07/04/2018 20:10     ASSESSMENT AND PLAN: Acute renal failure with chronic congestive heart failure. BNP has increased >3000. Echo 04/24/18 The right ventricular systolic pressure was  increased consistent   with mild pulmonary hypertension. Normal LVEF and normal Wall   motion and LVH, with MAC and mild MR.   Advise repeat echo, echo pending. Potassium is improving on CCRT. Heart rate and rhythm have normalized at 60-70s bpm. Advise repeat echo and will continue to monitor.   Jake Bathe, NP-C Cell: [phone]

## 2018-07-05 NOTE — Progress Notes (Addendum)
CRRT was initiated, but shortly after CRRT running, noted to have clotting with both lumens.  Orders placed to use CathFlo to declot line.  Also will place pt on Heparin gtt for now to avoid further clotting and assist CRRT with running, given pt's critical K and Bradycardia.  Pt denies any recent bleeding or surgeries, or history of head trauma or head bleeds.  Will proceed with Heparin gtt.

## 2018-07-05 NOTE — Progress Notes (Signed)
Pt stated she does not want to answer admission questions at this time, as she did not sleep last night and would like to rest right now.

## 2018-07-05 NOTE — Progress Notes (Signed)
[facility]  ROUNDING NOTE   Subjective:   [name] admitted to [facility] on 07/04/2018 for Acute renal failure (ARF) ([place]) [N17.9] Hyperkalemia [E87.5]  Patient states that she has been having progressive weakness, shortness of breath and peripheral edema for last few weeks.   Patient brought to ED after a syncopal episode. Found to have hyperkalemia and bradycardia. Given IV calcium, furosemide, insulin, dextrose, bicarbonate.   She was brought to ICU where left femoral temp HD catheter placed and patient was initiated on CRRT (CVVHD). Unfortunately, catheter was not running well and so patient required TPA.   Patient is alert and awake this morning. States her shortness of breath and weekness are improving. No longer with bradycardia. Now with irregular rhythm rate controlled in 60-70s.   Objective:  Vital signs in last 24 hours:  Temp:  [97.5 F (36.4 C)-98 F (36.7 C)] 98 F (36.7 C) (09/18 0400) Pulse Rate:  [39-77] 56 (09/18 0700) Resp:  [13-26] 21 (09/18 0700) BP: (111-174)/(28-63) 126/45 (09/18 0700) SpO2:  [89 %-100 %] 100 % (09/18 0700) Weight:  [121.9 kg] 121.9 kg (09/18 0500)  Weight change:  Filed Weights   07/04/18 2200 07/05/18 0500  Weight: 121.9 kg 121.9 kg    Intake/Output: I/O last 3 completed shifts: In: 339.8 [I.V.:36; IV Piggyback:303.8] Out: 150 [Urine:150]   Intake/Output this shift:  Total I/O In: -  Out: 48 [Other:48]  Physical Exam: General: Critically ill  Head: Normocephalic, atraumatic. Moist oral mucosal membranes  Eyes: Anicteric, PERRL  Neck: Supple, trachea midline  Lungs:  Clear to auscultation, [place]   Heart: irregular  Abdomen:  Soft, nontender, obese  Extremities: + peripheral edema.  Neurologic: Nonfocal, moving all four extremities  Skin: No lesions  Access: Left femoral temp HD catheter 9/92    Basic Metabolic Panel: Recent Labs  Lab 07/04/18 1908 07/04/18 2325 07/05/18 0310 07/05/18 0422  07/05/18 0423  NA 132* 135 137  --  137  K 7.3* >7.5* 7.3*  --  >7.5*  CL 105 105 106  --  105  CO2 13* 16* 15*  --  16*  GLUCOSE 327* 239* 233*  --  223*  BUN 75* 80* 79*  --  82*  CREATININE 5.11* 5.26* 5.05*  --  5.14*  CALCIUM 8.9 8.6* 8.8*  --  9.0  MG  --  2.4  --  2.9*  --   PHOS  --   --  8.3*  --  8.2*    Liver Function Tests: Recent Labs  Lab 07/04/18 1908 07/05/18 0310 07/05/18 0423  AST 85*  --   --   ALT 61*  --   --   ALKPHOS 78  --   --   BILITOT 0.4  --   --   PROT 7.0  --   --   ALBUMIN 3.8 3.6 3.6   Recent Labs  Lab 07/04/18 1908  LIPASE 63*   No results for input(s): AMMONIA in the last 168 hours.  CBC: Recent Labs  Lab 07/04/18 1908 07/04/18 2325 07/05/18 0422  WBC 8.8 8.8 10.0  HGB 8.0* 7.4* 7.3*  HCT 25.1* 23.0* 22.5*  MCV 93.9 92.3 93.4  PLT 339 314 301    Cardiac Enzymes: Recent Labs  Lab 07/04/18 1908  TROPONINI <0.03    BNP: Invalid input(s): POCBNP  CBG: Recent Labs  Lab 07/04/18 2019 07/04/18 2105 07/04/18 2133 07/04/18 2159 07/05/18 0754  GLUCAP 255* 271* 260* 216* 224*  Microbiology: Results for orders placed or performed during the hospital encounter of 07/04/18  MRSA PCR Screening     Status: None   Collection Time: 07/04/18 10:10 PM  Result Value Ref Range Status   MRSA by PCR NEGATIVE NEGATIVE Final    Comment:        The GeneXpert MRSA Assay (FDA approved for NASAL specimens only), is one component of a comprehensive MRSA colonization surveillance program. It is not intended to diagnose MRSA infection nor to guide or monitor treatment for MRSA infections. Performed at [facility], [address] [place], [place] [address]     Coagulation Studies: Recent Labs    07/05/18 0422  LABPROT 18.5*  INR 1.56    Urinalysis: Recent Labs    07/04/18 [place] 1.018  PHURINE 5.0  GLUCOSEU NEGATIVE  HGBUR NEGATIVE  BILIRUBINUR NEGATIVE  KETONESUR NEGATIVE   PROTEINUR 100*  NITRITE NEGATIVE  LEUKOCYTESUR MODERATE*      Imaging: Ct Head Wo Contrast  Result Date: 07/04/2018 CLINICAL DATA:  Weakness and nausea EXAM: CT HEAD WITHOUT CONTRAST TECHNIQUE: Contiguous axial images were obtained from the base of the skull through the vertex without intravenous contrast. 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 No acute intracranial hemorrhage, intra-axial mass nor extra-axial fluid collections. No edema or midline shift. No hydrocephalus. Midline fourth ventricle and basal cisterns without effacement. Intact brainstem and cerebellum. Vascular: No hyperdense vessel sign.  No unexpected calcifications. Skull: No acute skull fracture or suspicious osseous lesions. Sinuses/Orbits: Intact orbits and globes.  Clear paranasal sinuses. Other: Clear mastoids. IMPRESSION: No acute intracranial abnormality. 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 Electronically Signed   By: Ashley Royalty M.D.   On: 07/04/2018 20:12   Dg Chest Portable 1 View  Result Date: 07/04/2018 CLINICAL DATA:  Weakness, nausea, bradycardia, and hypotension. EXAM: PORTABLE CHEST 1 VIEW COMPARISON:  04/22/2018 FINDINGS: Stable mild-to-moderate cardiomegaly. Aortic atherosclerosis. No evidence of acute infiltrate or edema. No evidence of pleural effusion. IMPRESSION: Stable cardiomegaly.  No active lung disease. Electronically Signed   By: Earle Gell M.D.   On: 07/04/2018 20:10     Medications:   . sodium chloride    . heparin 1,000 Units/hr (07/05/18 0618)  . pureflow 3 each (07/05/18 0123)   . acidophilus  1 capsule Oral QHS  . aspirin EC  81 mg Oral Daily  . citalopram  10 mg Oral Daily  . docusate sodium  100 mg Oral BID  . insulin aspart  0-5 Units Subcutaneous QHS  . insulin aspart  0-9 Units Subcutaneous TID WC  . iron polysaccharides  150 mg Oral TID  . lamoTRIgine  25 mg Oral Daily   . levETIRAcetam  250 mg Oral BID  . loratadine  10 mg Oral Daily  . Melatonin  5 mg Oral QHS  . OLANZapine zydis  10 mg Oral QHS  . pantoprazole  40 mg Oral BID  . polyethylene glycol  17 g Oral Daily  . senna  1 tablet Oral Daily  . sodium chloride flush  3 mL Intravenous Q12H   sodium chloride, acetaminophen **OR** acetaminophen, albuterol, bisacodyl, heparin, ondansetron **OR** ondansetron (ZOFRAN) IV, senna-docusate, sodium chloride flush  Assessment/ Plan:  [name] is a 77 y.o. white female with congestive heart failure, hypertension, diabetes mellitus type II, obstructive sleep apnea, nephrolithiasis who has been admitted to [facility] on 07/04/2018 for acute renal failure, hyperkalemia,  metabolic acidosis requiring renal replacement therapy. Taking potassium chloride PO at home.   1. 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  2. Hyperkalemia 3. Metabolic Acidosis 4. Acute exacerbation of diastolic congestive failure 5. Normocytic Anemia 6. Hyperphosphatemia 7. Diabetes mellitus type II with chronic kidney disease: hemoglobin A1c 7.6% on 04/22/18  Plan Emergently started on renal replacement therapy. 2K CVVHD. Tolerating well. If potassium does not improve, will transition to intermittent hemodialysis.  - With history of nephrolithiasis: check renal ultrasound - Low potassium diet.  - Discontinue potassium supplements.    LOS: 1 Haden Cavenaugh 9/18/20198:38 AM

## 2018-07-05 NOTE — Care Management Note (Signed)
Case Management Note  Patient Details  Name: Carolyn Valdez MRN: [id number] Date of Birth: July 25, 1941  Subjective/Objective:                 Admitted from home with syncope and weakness. Admitted with bradycardia with potassium of > 7.5.  required emergent renal replacement therapy. and this catheter is to be removed today.  She lives alone and is currently followed by [facility] and PT.     Action/Plan:  Spoke with [facility] and discussed adding aide and SW to home health orders if able to discharge directly back home.  Mobilize patiet  Expected Discharge Date:                  Expected Discharge Plan:     In-House Referral:     Discharge planning Services     Post Acute Care Choice:    Choice offered to:     DME Arranged:    DME Agency:     HH Arranged:    HH Agency:     Status of Service:     If discussed at [employer], dates discussed:    Additional Comments:  Katrina Stack, RN 07/05/2018, 2:20 PM

## 2018-07-05 NOTE — Progress Notes (Signed)
CRRT not started until approximately 0615 d/t line clotting earlier in shift. CathFlo used to declot lines, lines functioning properly at this time, CRRT running without any issues. Will continue to assess. Wilnette Kales

## 2018-07-05 NOTE — Progress Notes (Signed)
ANTICOAGULATION CONSULT NOTE - Initial Consult  Pharmacy Consult for heparin drip Indication: CRRT machine clotting  Allergies  Allergen Reactions  . Sulfa Antibiotics Hives  . Biaxin [Clarithromycin] Hives  . Influenza A (H1n1) Monoval Vac Other (See Comments)    Pt states that she was told by her MD not to get the influenza vaccine.    . Morphine Other (See Comments)    Reaction:  Dizziness and confusion   . Pyridium [Phenazopyridine Hcl] Other (See Comments)    Reaction:  Unknown   . Ceftriaxone Anxiety  . Latex Rash  . Prednisone Rash  . Tape Rash    Patient Measurements: Height: 5\' 7"  (170.2 cm) Weight: 268 lb 11.9 oz (121.9 kg) IBW/kg (Calculated) : 61.6 Heparin Dosing Weight: 91 kg  Vital Signs: Temp: 97.7 F (36.5 C) (09/17 2200) Temp Source: Oral (09/17 2200) BP: 163/45 (09/17 2116) Pulse Rate: 77 (09/17 1911)  Labs: Recent Labs    07/04/18 1908 07/04/18 2325  HGB 8.0* 7.4*  HCT 25.1* 23.0*  PLT 339 314  CREATININE 5.11* 5.26*  TROPONINI <0.03  --     Estimated Creatinine Clearance: 12.5 mL/min (A) (by C-G formula based on SCr of 5.26 mg/dL (H)).   Medical History: Past Medical History:  Diagnosis Date  . Anxiety   . Breast cancer ([place]) 1997   right breast mastectomy  . CHF (congestive heart failure) ([place])   . Diabetes mellitus without complication ([place])   . DJD (degenerative joint disease)   . Dysuria   . H/O total knee replacement    right  . HTN (hypertension)   . Hypercholesteremia   . Kidney stone   . Obesity   . Recurrent urinary tract infection   . SVT (supraventricular tachycardia) ([place])   . Urinary incontinence   . Vaginal atrophy   . Yeast vaginitis     Medications:    Assessment:   Goal of Therapy:  Heparin level ~0.3-0.5 Monitor platelets by anticoagulation protocol: Yes   Plan:  2000 unit bolus and initial rate of 1000  Units/hr. Aiming for low end of heparin range per conversation with CCM. Attempting to prevent  clotting of CRRT circuit rather than full anticoagulation.  Ralf Konopka S 07/05/2018,3:01 AM

## 2018-07-05 NOTE — Progress Notes (Signed)
Pt being transferred to room 115. Report called to Manuela Schwartz, RN. Pt and belongings transferred to room 115 without incident.

## 2018-07-05 NOTE — Progress Notes (Signed)
[facility] for heparin drip Indication: CRRT machine clotting  Allergies  Allergen Reactions  . Sulfa Antibiotics Hives  . Biaxin [Clarithromycin] Hives  . Influenza A (H1n1) Monoval Vac Other (See Comments)    Pt states that she was told by her MD not to get the influenza vaccine.    . Morphine Other (See Comments)    Reaction:  Dizziness and confusion   . Pyridium [Phenazopyridine Hcl] Other (See Comments)    Reaction:  Unknown   . Ceftriaxone Anxiety  . Latex Rash  . Prednisone Rash  . Tape Rash    Patient Measurements: Height: 5\' 7"  (170.2 cm) Weight: 268 lb 11.9 oz (121.9 kg) IBW/kg (Calculated) : 61.6 Heparin Dosing Weight: 91 kg  Vital Signs: Temp: 98.3 F (36.8 C) (09/18 1200) Temp Source: Oral (09/18 1200) BP: 151/56 (09/18 1300) Pulse Rate: 70 (09/18 1300)  Labs: Recent Labs    07/04/18 1908 07/04/18 2325 07/05/18 0310 07/05/18 0422 07/05/18 0423 07/05/18 0904 07/05/18 1239  HGB 8.0* 7.4*  --  7.3*  --   --   --   HCT 25.1* 23.0*  --  22.5*  --   --   --   PLT 339 314  --  301  --   --   --   APTT  --   --   --  32  --   --  43*  LABPROT  --   --   --  18.5*  --   --   --   INR  --   --   --  1.56  --   --   --   HEPARINUNFRC  --   --   --   --   --   --  0.14*  CREATININE 5.11* 5.26* 5.05*  --  5.14* 4.10*  --   TROPONINI <0.03  --   --   --   --   --   --     Estimated Creatinine Clearance: 16 mL/min (A) (by C-G formula based on SCr of 4.1 mg/dL (H)).   Medical History: Past Medical History:  Diagnosis Date  . Anxiety   . Breast cancer ([place]) 1997   right breast mastectomy  . CHF (congestive heart failure) ([place])   . Diabetes mellitus without complication ([place])   . DJD (degenerative joint disease)   . Dysuria   . H/O total knee replacement    right  . HTN (hypertension)   . Hypercholesteremia   . Kidney stone   . Obesity   . Recurrent urinary tract infection   . SVT (supraventricular tachycardia)  ([place])   . Urinary incontinence   . Vaginal atrophy   . Yeast vaginitis     Assessment: Patient is a 77yo female admitted for hyperkalemia. Patient has been started on CRRT, pharmacy consulted for Heparin dosing. Aiming for low end of heparin range per conversation with CCM. Attempting to prevent clotting of CRRT circuit rather than full anticoagulation.   Heparin level 0.14, Heparin currently infusing at 1000 units/hr.  Goal of Therapy:  Heparin level ~0.3-0.5 Monitor platelets by anticoagulation protocol: Yes   Plan:  Will increase Heparin to 1200 units/hr. Recheck Heparin level in 8 hours.  Paulina Fusi, PharmD, BCPS 07/05/2018 3:44 PM

## 2018-07-05 NOTE — Progress Notes (Signed)
Inpatient Diabetes Program Recommendations  AACE/ADA: New Consensus Statement on Inpatient Glycemic Control (2019)  Target Ranges:  Prepandial:   less than 140 mg/dL      Peak postprandial:   less than 180 mg/dL (1-2 hours)      Critically ill patients:  140 - 180 mg/dL   Results for BARRETT, HOLTHAUS (MRN [id number]) as of 07/05/2018 11:16  Ref. Range 07/04/2018 20:19 07/04/2018 21:05 07/04/2018 21:33 07/04/2018 21:59 07/05/2018 07:54  Glucose-Capillary Latest Ref Range: 70 - 99 mg/dL 255 (H) 271 (H) 260 (H) 216 (H) 224 (H)   Review of Glycemic Control  Diabetes history: DM2 Outpatient Diabetes medications: Novolog 4 units TID with meals, Lantus 43 units QHS (per home med list not taking Lantus) Current orders for Inpatient glycemic control: Novolog 0-9 units TID with meals, Novolog 0-5 units QHS  Inpatient Diabetes Program Recommendations:  Insulin - Basal: Please consider ordering Levemir 15 units Q24H.  Thanks, Barnie Alderman, RN, MSN, CDE Diabetes Coordinator Inpatient Diabetes Program [phone] (Team Pager from 8am to 5pm)

## 2018-07-05 NOTE — Progress Notes (Signed)
*  PRELIMINARY RESULTS* Echocardiogram 2D Echocardiogram has been performed.  Carolyn Valdez 07/05/2018, 1:51 PM

## 2018-07-05 NOTE — Progress Notes (Signed)
CRRT medication adjustment  All medications reviewed. No medications currently require adjustment for CRRT.  Sim Boast, PharmD, BCPS  07/05/18 5:38 AM   \

## 2018-07-05 NOTE — Progress Notes (Signed)
[facility] [place] NAME: Carolyn Valdez    MR#:  [id number]  DATE OF BIRTH:  [birth year]  SUBJECTIVE:  CHIEF COMPLAINT:   Chief Complaint  Patient presents with  . Weakness   Came with feeling weak, noted bradycardia- High K. Had CRRT- K came down, HR stable now. Feels weak still.  REVIEW OF SYSTEMS:  CONSTITUTIONAL: No fever, have fatigue or weakness.  EYES: No blurred or double vision.  EARS, NOSE, AND THROAT: No tinnitus or ear pain.  RESPIRATORY: No cough, shortness of breath, wheezing or hemoptysis.  CARDIOVASCULAR: No chest pain, orthopnea, edema.  GASTROINTESTINAL: No nausea, vomiting, diarrhea or abdominal pain.  GENITOURINARY: No dysuria, hematuria.  ENDOCRINE: No polyuria, nocturia,  HEMATOLOGY: No anemia, easy bruising or bleeding SKIN: No rash or lesion. MUSCULOSKELETAL: No joint pain or arthritis.   NEUROLOGIC: No tingling, numbness, weakness.  PSYCHIATRY: No anxiety or depression.   ROS  DRUG ALLERGIES:   Allergies  Allergen Reactions  . Sulfa Antibiotics Hives  . Biaxin [Clarithromycin] Hives  . Influenza A (H1n1) Monoval Vac Other (See Comments)    Pt states that she was told by her MD not to get the influenza vaccine.    . Morphine Other (See Comments)    Reaction:  Dizziness and confusion   . Pyridium [Phenazopyridine Hcl] Other (See Comments)    Reaction:  Unknown   . Ceftriaxone Anxiety  . Latex Rash  . Prednisone Rash  . Tape Rash    VITALS:  Blood pressure (!) 151/56, pulse 70, temperature 98.7 F (37.1 C), temperature source Oral, resp. rate 19, height 5\' 7"  (1.702 m), weight 121.9 kg, SpO2 100 %.  PHYSICAL EXAMINATION:  GENERAL:  77 y.o.-year-old patient lying in the bed with no acute distress.  EYES: Pupils equal, round, reactive to light and accommodation. No scleral icterus. Extraocular muscles intact.  HEENT: Head atraumatic, normocephalic. Oropharynx and nasopharynx clear.  NECK:  Supple, no  jugular venous distention. No thyroid enlargement, no tenderness.  LUNGS: Normal breath sounds bilaterally, no wheezing, rales,rhonchi or crepitation. No use of accessory muscles of respiration.  CARDIOVASCULAR: S1, S2 normal. No murmurs, rubs, or gallops.  ABDOMEN: Soft, nontender, nondistended. Bowel sounds present. No organomegaly or mass.  EXTREMITIES: No pedal edema, cyanosis, or clubbing. Left groin catheter. NEUROLOGIC: Cranial nerves II through XII are intact. Muscle strength 4/5 in all extremities. Sensation intact. Gait not checked.  PSYCHIATRIC: The patient is alert and oriented x 3.  SKIN: No obvious rash, lesion, or ulcer.   Physical Exam LABORATORY PANEL:   CBC Recent Labs  Lab 07/05/18 0422  WBC 10.0  HGB 7.3*  HCT 22.5*  PLT 301   ------------------------------------------------------------------------------------------------------------------  Chemistries  Recent Labs  Lab 07/04/18 1908  07/05/18 1514  NA 132*   < > 138  K 7.3*   < > 4.2  CL 105   < > 105  CO2 13*   < > 25  GLUCOSE 327*   < > 170*  BUN 75*   < > 52*  CREATININE 5.11*   < > 3.15*  CALCIUM 8.9   < > 8.5*  MG  --    < > 2.2  AST 85*  --   --   ALT 61*  --   --   ALKPHOS 78  --   --   BILITOT 0.4  --   --    < > = values in this interval not displayed.   ------------------------------------------------------------------------------------------------------------------  Cardiac Enzymes Recent Labs  Lab 07/04/18 1908  TROPONINI <0.03   ------------------------------------------------------------------------------------------------------------------  RADIOLOGY:  Ct Head Wo Contrast  Result Date: 07/04/2018 CLINICAL DATA:  Weakness and nausea EXAM: CT HEAD WITHOUT CONTRAST TECHNIQUE: Contiguous axial images were obtained from the base of the skull through the vertex without intravenous contrast. COMPARISON:  04/23/2018 FINDINGS: Brain: Chronic stable small vessel ischemic disease of  periventricular white matter. No acute intracranial hemorrhage, intra-axial mass nor extra-axial fluid collections. No edema or midline shift. No hydrocephalus. Midline fourth ventricle and basal cisterns without effacement. Intact brainstem and cerebellum. Vascular: No hyperdense vessel sign.  No unexpected calcifications. Skull: No acute skull fracture or suspicious osseous lesions. Sinuses/Orbits: Intact orbits and globes.  Clear paranasal sinuses. Other: Clear mastoids. IMPRESSION: No acute intracranial abnormality. 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 Electronically Signed   By: Ashley Royalty M.D.   On: 07/04/2018 20:12   US Renal  Result Date: 07/05/2018 CLINICAL DATA:  Acute renal failure EXAM: RENAL ULTRASOUND COMPARISON:  February 01, 2018 FINDINGS: Right Kidney: Length: 10.3 cm. Echogenicity and renal cortical thickness are within normal limits. No mass, perinephric fluid, or hydronephrosis visualized. There is an apparent calculus in the upper pole of the right kidney measuring 1.2 cm in length. No ureterectasis. Left Kidney: Length: 10.5 cm. Echogenicity and renal cortical thickness are within normal limits. No mass, perinephric fluid, or hydronephrosis visualized. No sonographically demonstrable calculus or ureterectasis. Bladder: Decompressed with Foley catheter and cannot be assessed. IMPRESSION: 1.2 cm calculus upper pole right kidney, nonobstructing. Kidneys otherwise appear unremarkable. In particular, no obstructing focus. Renal echogenicity and cortical thickness within normal limits bilaterally. Electronically Signed   By: Lowella Grip III M.D.   On: 07/05/2018 10:54   Dg Chest Portable 1 View  Result Date: 07/04/2018 CLINICAL DATA:  Weakness, nausea, bradycardia, and hypotension. EXAM: PORTABLE CHEST 1 VIEW COMPARISON:  04/22/2018 FINDINGS: Stable mild-to-moderate cardiomegaly. Aortic atherosclerosis. No evidence of acute infiltrate or edema. No evidence of  pleural effusion. IMPRESSION: Stable cardiomegaly.  No active lung disease. Electronically Signed   By: Earle Gell M.D.   On: 07/04/2018 20:10    ASSESSMENT AND PLAN:   Active Problems:   Hyperkalemia  * Hyperkalemia with bradycardia. Monitor in stepdown unit.  treated with calcium gluconate, bicarb, NovoLog and D50.    CRRT started by Nephro- helped.  * Acute renal failure on CKD.   Due to dehydration Hold Lasix, improving after CRRT, cont to monitor.  * Acute respiratory failure with hypoxia due to acute on chronic diastolic CHF ejection fraction 70%. Hold Lasix at this time due to renal failure.  * Hyponatremia.  Follow-up BMP.  Hold Lasix.   Improved.    All the records are reviewed and case discussed with Care Management/Social Workerr. Management plans discussed with the patient, family and they are in agreement.  CODE STATUS: Full.  TOTAL TIME TAKING CARE OF THIS PATIENT: 35 minutes.     POSSIBLE D/C IN 1-2 DAYS, DEPENDING ON CLINICAL CONDITION.   Vaughan Basta M.D on 07/05/2018   Between 7am to 6pm - Pager - [phone]  After 6pm go to [email or web address] - password [email or web address] [facility]  Office  [phone]  CC: Primary care physician; Leonel Ramsay, MD  Note: This dictation was prepared with Dragon dictation along with smaller phrase technology. Any transcriptional errors that result from this process are unintentional.

## 2018-07-06 DIAGNOSIS — Z7189 Other specified counseling: Secondary | ICD-10-CM

## 2018-07-06 DIAGNOSIS — N179 Acute kidney failure, unspecified: Principal | ICD-10-CM

## 2018-07-06 DIAGNOSIS — E8770 Fluid overload, unspecified: Secondary | ICD-10-CM

## 2018-07-06 DIAGNOSIS — E875 Hyperkalemia: Secondary | ICD-10-CM

## 2018-07-06 DIAGNOSIS — Z515 Encounter for palliative care: Secondary | ICD-10-CM

## 2018-07-06 LAB — CBC WITH DIFFERENTIAL/PLATELET
BASOS ABS: 0.1 10*3/uL (ref 0–0.1)
BASOS PCT: 1 %
Eosinophils Absolute: 0.3 10*3/uL (ref 0–0.7)
Eosinophils Relative: 4 %
HEMATOCRIT: 19.1 % — AB (ref 35.0–47.0)
HEMOGLOBIN: 6.4 g/dL — AB (ref 12.0–16.0)
LYMPHS PCT: 22 %
Lymphs Abs: 1.7 10*3/uL (ref 1.0–3.6)
MCH: 30.6 pg (ref 26.0–34.0)
MCHC: 33.7 g/dL (ref 32.0–36.0)
MCV: 90.9 fL (ref 80.0–100.0)
Monocytes Absolute: 1.1 10*3/uL — ABNORMAL HIGH (ref 0.2–0.9)
Monocytes Relative: 14 %
NEUTROS ABS: 4.3 10*3/uL (ref 1.4–6.5)
NEUTROS PCT: 59 %
Platelets: 234 10*3/uL (ref 150–440)
RBC: 2.1 MIL/uL — ABNORMAL LOW (ref 3.80–5.20)
RDW: 18.6 % — ABNORMAL HIGH (ref 11.5–14.5)
WBC: 7.4 10*3/uL (ref 3.6–11.0)

## 2018-07-06 LAB — HEPATIC FUNCTION PANEL
ALBUMIN: 3.2 g/dL — AB (ref 3.5–5.0)
ALK PHOS: 78 U/L (ref 38–126)
ALT: 101 U/L — AB (ref 0–44)
AST: 83 U/L — AB (ref 15–41)
Bilirubin, Direct: <0.1 mg/dL (ref 0.0–0.2)
TOTAL PROTEIN: 5.7 g/dL — AB (ref 6.5–8.1)
Total Bilirubin: 0.6 mg/dL (ref 0.3–1.2)

## 2018-07-06 LAB — RENAL FUNCTION PANEL
ALBUMIN: 3.1 g/dL — AB (ref 3.5–5.0)
ANION GAP: 9 (ref 5–15)
BUN: 52 mg/dL — ABNORMAL HIGH (ref 8–23)
CALCIUM: 8.4 mg/dL — AB (ref 8.9–10.3)
CO2: 25 mmol/L (ref 22–32)
Chloride: 104 mmol/L (ref 98–111)
Creatinine, Ser: 3.23 mg/dL — ABNORMAL HIGH (ref 0.44–1.00)
GFR, EST AFRICAN AMERICAN: 15 mL/min — AB (ref >60)
GFR, EST NON AFRICAN AMERICAN: 13 mL/min — AB (ref >60)
GLUCOSE: 190 mg/dL — AB (ref 70–99)
PHOSPHORUS: 4.2 mg/dL (ref 2.5–4.6)
Potassium: 4.7 mmol/L (ref 3.5–5.1)
Sodium: 138 mmol/L (ref 135–145)

## 2018-07-06 LAB — GLUCOSE, CAPILLARY
GLUCOSE-CAPILLARY: 158 mg/dL — AB (ref 70–99)
Glucose-Capillary: 147 mg/dL — ABNORMAL HIGH (ref 70–99)
Glucose-Capillary: 132 mg/dL — ABNORMAL HIGH (ref 70–99)
Glucose-Capillary: 237 mg/dL — ABNORMAL HIGH (ref 70–99)
Glucose-Capillary: 268 mg/dL — ABNORMAL HIGH (ref 70–99)

## 2018-07-06 LAB — PREPARE RBC (CROSSMATCH)

## 2018-07-06 LAB — MAGNESIUM: Magnesium: 2.3 mg/dL (ref 1.7–2.4)

## 2018-07-06 LAB — APTT: APTT: 68 s — AB (ref 24–36)

## 2018-07-06 LAB — AMMONIA: Ammonia: 22 umol/L (ref 9–35)

## 2018-07-06 MED ORDER — LEVOFLOXACIN 250 MG PO TABS
250.0000 mg | ORAL_TABLET | Freq: Every day | ORAL | Status: DC
  Administered 2018-07-06 – 2018-07-07 (×2): 250 mg via ORAL
  Filled 2018-07-06 (×2): qty 1

## 2018-07-06 MED ORDER — HEPARIN BOLUS VIA INFUSION
1400.0000 [IU] | Freq: Once | INTRAVENOUS | Status: AC
  Administered 2018-07-06: 01:00:00 1400 [IU] via INTRAVENOUS
  Filled 2018-07-06: qty 1400

## 2018-07-06 MED ORDER — HYDRALAZINE HCL 50 MG PO TABS
25.0000 mg | ORAL_TABLET | Freq: Three times a day (TID) | ORAL | Status: DC
  Administered 2018-07-06 – 2018-07-14 (×22): 25 mg via ORAL
  Filled 2018-07-06 (×23): qty 1

## 2018-07-06 MED ORDER — HEPARIN SODIUM (PORCINE) 5000 UNIT/ML IJ SOLN
5000.0000 [IU] | Freq: Three times a day (TID) | INTRAMUSCULAR | Status: DC
  Administered 2018-07-06 – 2018-07-14 (×24): 5000 [IU] via SUBCUTANEOUS
  Filled 2018-07-06 (×24): qty 1

## 2018-07-06 MED ORDER — SODIUM CHLORIDE 0.9% IV SOLUTION
Freq: Once | INTRAVENOUS | Status: AC
  Administered 2018-07-06: 10:00:00 via INTRAVENOUS

## 2018-07-06 NOTE — Consult Note (Signed)
Consultation Note Date: 07/06/2018   Patient Name: Carolyn Valdez  DOB: 06-24-1941  MRN: [id number]  Age / Sex: 77 y.o., female  PCP: Carolyn Ramsay, MD Referring Physician: Vaughan Valdez, *  Reason for Consultation: Establishing goals of care  HPI/Patient Profile: 77 y.o. female admitted on 07/04/2018 from home with complaints of weakness and leg swelling.  Patient has a past medical history significant for diabetes, hyperlipidemia, hypertension, CHF, breast cancer (right breast mastectomy 1997), urinary incontinence, SVT, obesity, right total knee replacement, and anxiety.  During her ED course patient complained of shortness of breath, feeling weak for several days prior to admission, and bilateral leg edema.  She was found to be hypoxic and placed on nasal cannula.  Patient was also found to be bradycardic with heart rates in the 30s/40s.  Her potassium was 7.3.  CO2 13.  BUN 75.  Creatinine 5.11.  Lipase 63.  BNP 3147.  Hemoglobin 8.0.  Troponin <0.03.  Patient was treated with calcium gluconate, bicarb, NovoLog, and D50.  Since admission she has been followed by cardiology and nephrology.  Patient also received CRRT.  Palliative medicine team consulted for goals of care discussion.  Clinical Assessment and Goals of Care: I have reviewed medical records including lab results, imaging, Epic notes, and MAR, received report from the bedside RN, and assessed the patient. I then discussed diagnosis prognosis, GOC, EOL wishes, disposition and options.  Patient was initially lethargic.  She would awaken to touch and her name being called but would drift back to sleep.  NT at the bedside to assist with cleaning patient up due to saturated bed.  After cleaning patient up and repositioning and turning in bed patient became more alert.  When I expressed my concerns with patient being so lethargic she states she is a  very hard sleeper and sometimes it takes a while for her to wake up.  Bedside RN reported patient refused to have lab work and take medications earlier today.  Patient states she does not remember any of these situations, and offered to take her medications at this time.  Bedside RN present in preparing to administer medications.  Patient requested to receive medications with applesauce.  Offered to contact family members to be engaged in goals of care discussion with family and I, however patient states her sister Carolyn Valdez is on vacation in [place] and not to bother her.  Given patient is alert and oriented x3, will continue with goals of care discussion with patient only.  Patient was able to identify her name, date of birth, location, reason for hospitalization, current year and president.  I introduced Palliative Medicine as specialized medical care for people living with serious illness. It focuses on providing relief from the symptoms and stress of a serious illness. The goal is to improve quality of life for both the patient and the family.  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  She reports she has 1 sister who lives in [place]. she reports her sister has to use a motorized wheelchair.  Patient states she does not have any children as she was unable to conceive.  She has a dog named Abby.  As far as functional and nutritional status patient states prior to hospitalization she was living alone in her home, with her friend who comes daily to assist with meals, errands, appointments, and house keeping.  Patient does endorse having several falls in the home and that she ambulates with a walker assistance.  He reports her medical doctors makes house calls usually to prevent her from having to make multiple visits to the office.  Patient states her appetite is generally pretty good however she  does suffer from chronic nausea.  She reports that she does have anti-emetics on hand at home.  Patient reports she is able to perform ADLs independently most of the time.  She has an in-home health care RN and PT.   We discussed her current illness and what it means in the larger context of her on-going co-morbidities.  Natural disease trajectory and expectations at EOL were discussed.  Patient verbalizes that she is aware that she is sicker than normal.  She states she remains hopeful that she was somewhat improve.  I expressed medical concern for patient returning home with no 24/7 care support.  Patient verbalized understanding.  I attempted to elicit values and goals of care important to the patient.    The difference between aggressive medical intervention and comfort care was considered in light of the patient's goals of care.  At this time patient has requested to continue to treat the treatable with aggressive measures.  Patient states she does have a living we will however it requires several updates and therefore she feels that it is invalid.  She does not have a designated healthcare power of attorney however she does state in the event she is unable to make medical decisions that her sister Carolyn Valdez would be the person she would like to make these medical decisions.  I discussed in detail patient's current CODE STATUS in collaboration with her current illness and ongoing comorbidities.  Patient states that previous doctors has discussed this with her as well during her admission.  She states "part of me feels like I should be a DNR because I know I will not get much better from here, but part of me does not want to make that decision right now".  I attempted to discuss what a code situation could potentially look like in her current illness.  Patient states she would need to continue to think about it and possibly talk with her sister in regards to her wishes.  Patient did verbalize that  she would not be interested in long-term dialysis or artificial feedings.  Hospice and Palliative Care services outpatient were explained and offered.  Given patient's wishes to continue with medical treatment options we discussed both hospice and palliative care services.  At this point patient is hopeful she will show some signs of improvement and could possibly continue in rehab.  Educated patient that if this continues to be a reasonable option and her wishes outpatient palliative services would be most appropriate.  Patient verbalized understanding and her wishes to have outpatient palliative once discharged.  Patient was also educated in regards to her current illness and comorbidities and advised that if she continued to show signs of decline hospice services could  potentially be more appropriate.  Patient verbalized understanding and stated she was not prepared to be on the hospice services.  Again I offered to contact patient's Sister Carolyn Valdez and introduced myself and briefly review my discussion with patient.  I expressed the patient my concerns was how lethargic she was upon me entering the room.  Patient continued to state when she is in a deep sleep this is how she is often.  Offered to contact sister at the bedside so that patient could be present for conversation however patient denied and states she will remember and will discuss conversations with her sister including her CODE STATUS.  Questions and concerns were addressed. The family was encouraged to call with questions or concerns.  PMT will continue to support holistically.  Patient verbalized if she is unable to make healthcare decisions that her sister Carolyn Valdez would be her designated Advice worker. NEXT OF KIN    SUMMARY OF RECOMMENDATIONS    Full code-at patient's request.  However patient did express that she would like to have a discussion with her sister in some time to consider changing her CODE STATUS  to DNR/DNI.  She verbalized that she felt she probably should be a DNR/DNI knowing that she most likely would not get any better in the event she did require CPR or intubation.  Offered to contact patient's Sister Carolyn Valdez, who patient stated is on vacation in [place].  However patient declined to have me contact sister.  States she will discuss with her sister when she talks to her later today.  Discussed with patient medical concern that she was extremely lethargic prior to myself and staff bathing and repositioning her in bed.  Patient has requested to have outpatient palliative services at discharge.  Case manager referral for outpatient palliative services.  [facility] medicine team will continue to support patient, patient's family, and medical team during hospitalization as needed.  Code Status/Advance Care Planning:  Full code  Palliative Prophylaxis:   Aspiration, Bowel Regimen and Delirium Protocol  Additional Recommendations (Limitations, Scope, Preferences):  Full Scope Treatment  Psycho-social/Spiritual:   Desire for further Chaplaincy support:no  Prognosis:   Unable to determine-guarded to poor in the setting of hyperkalemia, hypertension, bradycardia, additionally, diabetes, CHF, dehydration, CKD, acute respiratory failure with hypoxia, and hyponatremia.  Discharge Planning: To Be Determined with outpatient palliative.     Primary Diagnoses: Present on Admission: . Hyperkalemia   I have reviewed the medical record, interviewed the patient and family, and examined the patient. The following aspects are pertinent.  Past Medical History:  Diagnosis Date  . Anxiety   . Breast cancer ([place]) 1997   right breast mastectomy  . CHF (congestive heart failure) ([name])   . Diabetes mellitus without complication ([place])   . DJD (degenerative joint disease)   . Dysuria   . H/O total knee replacement    right  . HTN (hypertension)   . Hypercholesteremia   . Kidney stone    . Obesity   . Recurrent urinary tract infection   . SVT (supraventricular tachycardia) ([place])   . Urinary incontinence   . Vaginal atrophy   . Yeast vaginitis    Social History   Socioeconomic History  . Marital status: Widowed    Spouse name: Not on file  . Number of children: Not on file  . Years of education: Not on file  . Highest education level: Not on file  Occupational History  . Not on file  Social Needs  .  Financial resource strain: Not on file  . Food insecurity:    Worry: Not on file    Inability: Not on file  . Transportation needs:    Medical: Not on file    Non-medical: Not on file  Tobacco Use  . Smoking status: Former Research scientist (life sciences)  . Smokeless tobacco: Former Systems developer    Quit date: 10/29/1995  Substance and Sexual Activity  . Alcohol use: No    Alcohol/week: 0.0 standard drinks  . Drug use: No  . Sexual activity: Never  Lifestyle  . Physical activity:    Days per week: Not on file    Minutes per session: Not on file  . Stress: Not on file  Relationships  . Social connections:    Talks on phone: Not on file    Gets together: Not on file    Attends religious service: Not on file    Active member of club or organization: Not on file    Attends meetings of clubs or organizations: Not on file    Relationship status: Not on file  Other Topics Concern  . Not on file  Social History Narrative  . Not on file   Family History  Problem Relation Age of Onset  . Lung cancer Father   . Hematuria Mother   . Lung cancer Mother   . Kidney disease Neg Hx   . Bladder Cancer Neg Hx   . Breast cancer Neg Hx    Scheduled Meds: . acidophilus  1 capsule Oral QHS  . aspirin EC  81 mg Oral Daily  . citalopram  10 mg Oral Daily  . docusate sodium  100 mg Oral BID  . heparin injection (subcutaneous)  5,000 Units Subcutaneous Q8H  . insulin aspart  0-5 Units Subcutaneous QHS  . insulin aspart  0-9 Units Subcutaneous TID WC  . iron polysaccharides  150 mg Oral TID  .  lamoTRIgine  25 mg Oral Daily  . levETIRAcetam  250 mg Oral BID  . levofloxacin  250 mg Oral Daily  . loratadine  10 mg Oral Daily  . mouth rinse  15 mL Mouth Rinse BID  . Melatonin  5 mg Oral QHS  . OLANZapine zydis  10 mg Oral QHS  . pantoprazole  40 mg Oral BID  . polyethylene glycol  17 g Oral Daily  . senna  1 tablet Oral Daily  . sodium chloride flush  3 mL Intravenous Q12H   Continuous Infusions: . sodium chloride    . pureflow 3 each (07/05/18 1238)   PRN Meds:.sodium chloride, acetaminophen **OR** acetaminophen, albuterol, bisacodyl, heparin, ondansetron **OR** ondansetron (ZOFRAN) IV, senna-docusate, sodium chloride flush Medications Prior to Admission:  Prior to Admission medications   Medication Sig Start Date End Date Taking? Authorizing Provider  acetaminophen (TYLENOL) 325 MG tablet Take 2 tablets (650 mg total) by mouth every 6 (six) hours as needed for mild pain (or Fever >/= 101). 04/26/18  Yes Wieting, Richard, MD  acidophilus (RISAQUAD) CAPS capsule Take 1 capsule by mouth at bedtime.   Yes [provider]  aspirin EC 81 MG EC tablet Take 1 tablet (81 mg total) by mouth daily. 04/26/18  Yes Wieting, Richard, MD  citalopram (CELEXA) 10 MG tablet Take 1 tablet (10 mg total) by mouth daily. 02/04/17  Yes Wieting, Richard, MD  clonazePAM (KLONOPIN) 0.5 MG tablet Take 0.5 mg by mouth 2 (two) times daily.   Yes [provider]  docusate sodium (COLACE) 100 MG capsule Take 100  mg by mouth 2 (two) times daily.    Yes [provider]  fluticasone (FLONASE) 50 MCG/ACT nasal spray Place 2 sprays into both nostrils daily.   Yes [provider]  furosemide (LASIX) 40 MG tablet Take 1 tablet (40 mg total) by mouth 2 (two) times daily. Patient taking differently: Take 20 mg by mouth daily.  02/04/17  Yes Wieting, Richard, MD  hydrALAZINE (APRESOLINE) 50 MG tablet Take 1 tablet (50 mg total) by mouth every 8 (eight) hours. 04/26/18  Yes Wieting,  Richard, MD  iron polysaccharides (NIFEREX) 150 MG capsule Take 150 mg by mouth 3 (three) times daily.    Yes [provider]  lamoTRIgine (LAMICTAL) 25 MG tablet Take 1 tablet (25 mg total) by mouth daily. 04/26/18  Yes Wieting, Richard, MD  loratadine (CLARITIN) 10 MG tablet Take 10 mg by mouth daily.   Yes [provider]  Melatonin 3 MG TABS Take 6 mg by mouth at bedtime.   Yes [provider]  metFORMIN (GLUCOPHAGE) 500 MG tablet Take 1,000 mg by mouth 2 (two) times daily with a meal.   Yes [provider]  metoprolol (LOPRESSOR) 50 MG tablet Take 50 mg by mouth 2 (two) times daily.   Yes [provider]  nitrofurantoin (MACRODANTIN) 100 MG capsule Take 1 capsule (100 mg total) by mouth daily. 02/06/18  Yes MacDiarmid, Nicki Reaper, MD  OLANZapine zydis (ZYPREXA) 10 MG disintegrating tablet Take 1 tablet (10 mg total) by mouth at bedtime. 04/26/18  Yes Wieting, Richard, MD  pantoprazole (PROTONIX) 40 MG tablet Take 40 mg by mouth 2 (two) times daily. 02/09/17  Yes [provider]  polyethylene glycol (MIRALAX / GLYCOLAX) packet Take 17 g by mouth daily. 04/26/18  Yes Wieting, Richard, MD  potassium chloride SA (K-DUR,KLOR-CON) 20 MEQ tablet Take 1 tablet (20 mEq total) by mouth 2 (two) times daily. 04/26/18  Yes Wieting, Richard, MD  tolterodine (DETROL LA) 4 MG 24 hr capsule Take 4 mg by mouth daily.   Yes [provider]  feeding supplement, ENSURE ENLIVE, (ENSURE ENLIVE) LIQD Take 237 mLs by mouth 3 (three) times daily between meals. Patient not taking: Reported on 04/22/2018 10/26/16   Epifanio Lesches, MD  insulin aspart (NOVOLOG) 100 UNIT/ML injection Inject 4 Units into the skin 3 (three) times daily with meals. Patient not taking: Reported on 07/05/2018 04/26/18   Loletha Grayer, MD  insulin glargine (LANTUS) 100 UNIT/ML injection Inject 0.43 mLs (43 Units total) into the skin at bedtime. Patient not taking: Reported on 04/22/2018 02/04/17    Loletha Grayer, MD  ipratropium-albuterol (DUONEB) 0.5-2.5 (3) MG/3ML SOLN Take 3 mLs by nebulization 3 (three) times daily. Patient not taking: Reported on 04/22/2018 02/04/17   Loletha Grayer, MD  levETIRAcetam (KEPPRA) 250 MG tablet Take 1 tablet (250 mg total) by mouth 2 (two) times daily. Patient not taking: Reported on 07/05/2018 10/26/16   Epifanio Lesches, MD  senna (SENOKOT) 8.6 MG TABS tablet Take 1 tablet (8.6 mg total) by mouth daily. Patient not taking: Reported on 07/05/2018 09/30/16   Theodoro Grist, MD   Allergies  Allergen Reactions  . Sulfa Antibiotics Hives  . Biaxin [Clarithromycin] Hives  . Influenza A (H1n1) Monoval Vac Other (See Comments)    Pt states that she was told by her MD not to get the influenza vaccine.    . Morphine Other (See Comments)    Reaction:  Dizziness and confusion   . Pyridium [Phenazopyridine Hcl] Other (See Comments)  Reaction:  Unknown   . Ceftriaxone Anxiety  . Latex Rash  . Prednisone Rash  . Tape Rash   Review of Systems  Constitutional: Positive for activity change.  Respiratory: Positive for shortness of breath.   Cardiovascular: Positive for leg swelling.  Musculoskeletal: Positive for back pain.  Neurological: Positive for weakness.  All other systems reviewed and are negative.   Physical Exam  Constitutional: She is oriented to person, place, and time. She is cooperative. She has a sickly appearance.  Cardiovascular: Regular rhythm and normal heart sounds. Bradycardia present. Exam reveals decreased pulses.  Bilateral lower extremity edema   Abdominal: Soft. Normal appearance and bowel sounds are normal.  Obese   Genitourinary:  Genitourinary Comments: Foley   Neurological: She is alert and oriented to person, place, and time.  Somewhat lethargic, but became more alert   Skin: Skin is warm and dry.  Left femoral dialysis cath in place   Psychiatric: She has a normal mood and affect. Her speech is normal and  behavior is normal. Judgment and thought content normal. Cognition and memory are normal.  Nursing note and vitals reviewed.   Vital Signs: BP (!) 155/51   Pulse 72   Temp 98.6 F (37 C) (Oral)   Resp 18   Ht 5\' 7"  (1.702 m)   Wt 123.4 kg   SpO2 99%   BMI 42.61 kg/m  Pain Scale: 0-10   Pain Score: 0-No pain   SpO2: SpO2: 99 % O2 Device:SpO2: 99 % O2 Flow Rate: .O2 Flow Rate (L/min): 2 L/min  IO: Intake/output summary:   Intake/Output Summary (Last 24 hours) at 07/06/2018 1338 Last data filed at 07/06/2018 1300 Gross per 24 hour  Intake 744.37 ml  Output 947 ml  Net -202.63 ml    LBM: Last BM Date: 07/04/18 Baseline Weight: Weight: 121.9 kg Most recent weight: Weight: 123.4 kg     Palliative Assessment/Data:PPS 30%   Time In:1230 Time Out: 1335 Time Total: 10 Min.   Greater than 50%  of this time was spent counseling and coordinating care related to the above assessment and plan.  Signed by: Alda Lea, AGPCNP-BC Palliative Medicine Team  Phone: [phone] Fax: [phone] Pager: [phone] Amion: Bjorn Pippin    Please contact Palliative Medicine Team phone at [phone] for questions and concerns.  For individual provider: See Shea Evans

## 2018-07-06 NOTE — Progress Notes (Signed)
[place] for heparin drip Indication: CRRT machine clotting  Allergies  Allergen Reactions  . Sulfa Antibiotics Hives  . Biaxin [Clarithromycin] Hives  . Influenza A (H1n1) Monoval Vac Other (See Comments)    Pt states that she was told by her MD not to get the influenza vaccine.    . Morphine Other (See Comments)    Reaction:  Dizziness and confusion   . Pyridium [Phenazopyridine Hcl] Other (See Comments)    Reaction:  Unknown   . Ceftriaxone Anxiety  . Latex Rash  . Prednisone Rash  . Tape Rash    Patient Measurements: Height: 5\' 7"  (170.2 cm) Weight: 268 lb 11.9 oz (121.9 kg) IBW/kg (Calculated) : 61.6 Heparin Dosing Weight: 91 kg  Vital Signs: Temp: 97.6 F (36.4 C) (09/18 2033) Temp Source: Oral (09/18 2033) BP: 113/48 (09/18 2344) Pulse Rate: 75 (09/18 2344)  Labs: Recent Labs    07/04/18 1908 07/04/18 2325  07/05/18 0422 07/05/18 0423 07/05/18 0904 07/05/18 1239 07/05/18 1514 07/05/18 2259  HGB 8.0* 7.4*  --  7.3*  --   --   --   --   --   HCT 25.1* 23.0*  --  22.5*  --   --   --   --   --   PLT 339 314  --  301  --   --   --   --   --   APTT  --   --   --  32  --   --  43*  --   --   LABPROT  --   --   --  18.5*  --   --   --   --   --   INR  --   --   --  1.56  --   --   --   --   --   HEPARINUNFRC  --   --   --   --   --   --  0.14*  --  0.28*  CREATININE 5.11* 5.26*   < >  --  5.14* 4.10*  --  3.15*  --   TROPONINI <0.03  --   --   --   --   --   --   --   --    < > = values in this interval not displayed.    Estimated Creatinine Clearance: 20.9 mL/min (A) (by C-G formula based on SCr of 3.15 mg/dL (H)).   Medical History: Past Medical History:  Diagnosis Date  . Anxiety   . Breast cancer ([place]) 1997   right breast mastectomy  . CHF (congestive heart failure) ([place])   . Diabetes mellitus without complication ([place])   . DJD (degenerative joint disease)   . Dysuria   . H/O total knee replacement    right  . HTN (hypertension)   . Hypercholesteremia   . Kidney stone   . Obesity   . Recurrent urinary tract infection   . SVT (supraventricular tachycardia) ([place])   . Urinary incontinence   . Vaginal atrophy   . Yeast vaginitis     Assessment: Patient is a 77yo female admitted for hyperkalemia. Patient has been started on CRRT, pharmacy consulted for Heparin dosing. Aiming for low end of heparin range per conversation with CCM. Attempting to prevent clotting of CRRT circuit rather than full anticoagulation.   Heparin level 0.14, Heparin currently infusing at 1000 units/hr.  Goal of Therapy:  Heparin  level ~0.3-0.5 Monitor platelets by anticoagulation protocol: Yes   Plan:  Will increase Heparin to 1200 units/hr. Recheck Heparin level in 8 hours.  9/18 PM heparin level 0.28. 1400 unit bolus and increase rate to 1400 units/hr. Recheck in 8 hours.  Sim Boast, PharmD, BCPS  07/06/18 12:35 AM

## 2018-07-06 NOTE — Progress Notes (Signed)
Patient is refusing to take AM medication MD made aware , will continue to monitor ,

## 2018-07-06 NOTE — Progress Notes (Signed)
[place] [facility] [place] NAME: Carolyn Valdez    MR#:  [id number]  DATE OF BIRTH:  [birth year]  SUBJECTIVE:  CHIEF COMPLAINT:   Chief Complaint  Patient presents with  . Weakness   Came with feeling weak, noted bradycardia- High K. Had CRRT- K came down, HR stable now. Feels weak still.  Drowsy today.  REVIEW OF SYSTEMS:   Pt is drowsy, can not give ROS. ROS  DRUG ALLERGIES:   Allergies  Allergen Reactions  . Sulfa Antibiotics Hives  . Biaxin [Clarithromycin] Hives  . Influenza A (H1n1) Monoval Vac Other (See Comments)    Pt states that she was told by her MD not to get the influenza vaccine.    . Morphine Other (See Comments)    Reaction:  Dizziness and confusion   . Pyridium [Phenazopyridine Hcl] Other (See Comments)    Reaction:  Unknown   . Ceftriaxone Anxiety  . Latex Rash  . Prednisone Rash  . Tape Rash    VITALS:  Blood pressure (!) 162/67, pulse 75, temperature 97.8 F (36.6 C), temperature source Oral, resp. rate 20, height 5\' 7"  (1.702 m), weight 123.4 kg, SpO2 99 %.  PHYSICAL EXAMINATION:  GENERAL:  77 y.o.-year-old patient lying in the bed with no acute distress.  EYES: Pupils equal, round, reactive to light and accommodation. No scleral icterus. Extraocular muscles intact.  HEENT: Head atraumatic, normocephalic. Oropharynx and nasopharynx clear.  NECK:  Supple, no jugular venous distention. No thyroid enlargement, no tenderness.  LUNGS: Normal breath sounds bilaterally, no wheezing, some crepitation. No use of accessory muscles of respiration.  CARDIOVASCULAR: S1, S2 normal. No murmurs, rubs, or gallops.  ABDOMEN: Soft, nontender, nondistended. Bowel sounds present. No organomegaly or mass.  EXTREMITIES: b/l pedal edema,no cyanosis, or clubbing. NEUROLOGIC: Pt is drowsy, some words on stimuli, but not much response.  PSYCHIATRIC: The patient is drowsy.  SKIN: No obvious rash, lesion, or ulcer.   Physical  Exam LABORATORY PANEL:   CBC Recent Labs  Lab 07/06/18 0434  WBC 7.4  HGB 6.4*  HCT 19.1*  PLT 234   ------------------------------------------------------------------------------------------------------------------  Chemistries  Recent Labs  Lab 07/04/18 1908  07/06/18 0434  NA 132*   < > 138  K 7.3*   < > 4.7  CL 105   < > 104  CO2 13*   < > 25  GLUCOSE 327*   < > 190*  BUN 75*   < > 52*  CREATININE 5.11*   < > 3.23*  CALCIUM 8.9   < > 8.4*  MG  --    < > 2.3  AST 85*  --   --   ALT 61*  --   --   ALKPHOS 78  --   --   BILITOT 0.4  --   --    < > = values in this interval not displayed.   ------------------------------------------------------------------------------------------------------------------  Cardiac Enzymes Recent Labs  Lab 07/04/18 1908  TROPONINI <0.03   ------------------------------------------------------------------------------------------------------------------  RADIOLOGY:  Ct Head Wo Contrast  Result Date: 07/04/2018 CLINICAL DATA:  Weakness and nausea EXAM: CT HEAD WITHOUT CONTRAST TECHNIQUE: Contiguous axial images were obtained from the base of the skull through the vertex without intravenous contrast. 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 No acute intracranial hemorrhage, intra-axial mass nor extra-axial fluid collections. No edema or midline shift. No hydrocephalus. Midline fourth ventricle and basal cisterns without effacement. Intact brainstem  and cerebellum. Vascular: No hyperdense vessel sign.  No unexpected calcifications. Skull: No acute skull fracture or suspicious osseous lesions. Sinuses/Orbits: Intact orbits and globes.  Clear paranasal sinuses. Other: Clear mastoids. IMPRESSION: No acute intracranial abnormality. 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 Electronically Signed   By: Ashley Royalty M.D.   On: 07/04/2018 20:12   US  Renal  Result Date: 07/05/2018 CLINICAL DATA:  Acute renal failure EXAM: RENAL ULTRASOUND COMPARISON:  February 01, 2018 FINDINGS: Right Kidney: Length: 10.3 cm. Echogenicity and renal cortical thickness are within normal limits. No mass, perinephric fluid, or hydronephrosis visualized. There is an apparent calculus in the upper pole of the right kidney measuring 1.2 cm in length. No ureterectasis. Left Kidney: Length: 10.5 cm. Echogenicity and renal cortical thickness are within normal limits. No mass, perinephric fluid, or hydronephrosis visualized. No sonographically demonstrable calculus or ureterectasis. Bladder: Decompressed with Foley catheter and cannot be assessed. IMPRESSION: 1.2 cm calculus upper pole right kidney, nonobstructing. Kidneys otherwise appear unremarkable. In particular, no obstructing focus. Renal echogenicity and cortical thickness within normal limits bilaterally. Electronically Signed   By: Lowella Grip III M.D.   On: 07/05/2018 10:54   Dg Chest Portable 1 View  Result Date: 07/04/2018 CLINICAL DATA:  Weakness, nausea, bradycardia, and hypotension. EXAM: PORTABLE CHEST 1 VIEW COMPARISON:  04/22/2018 FINDINGS: Stable mild-to-moderate cardiomegaly. Aortic atherosclerosis. No evidence of acute infiltrate or edema. No evidence of pleural effusion. IMPRESSION: Stable cardiomegaly.  No active lung disease. Electronically Signed   By: Earle Gell M.D.   On: 07/04/2018 20:10    ASSESSMENT AND PLAN:   Active Problems:   Hyperkalemia  * Hyperkalemia with bradycardia. Monitor in stepdown unit.  treated with calcium gluconate, bicarb, NovoLog and D50.    CRRT started by Nephro- helped.  * Acute renal failure on CKD.   Due to dehydration Hold Lasix, improving after CRRT, cont to monitor.  *Altered mental status Likely due to UTI, also due to renal failure Vitals are stable and blood sugar is stable. I will check Keppra level. Started on treatment for UTI. Also spoke to  her sister on the phone who is POA.  * Acute respiratory failure with hypoxia due to acute on chronic diastolic CHF ejection fraction 70%. Hold Lasix at this time due to renal failure. Cardiology consult.  Patient have increased pulmonary artery pressure per echocardiogram.  *UTI Levaquin for now and follow urine culture.  * Hyponatremia.  Follow-up BMP.  Hold Lasix.   Improved.   All the records are reviewed and case discussed with Care Management/Social Workerr. Management plans discussed with the patient, family and they are in agreement.  CODE STATUS: Full.  TOTAL TIME TAKING CARE OF THIS PATIENT: 35 minutes.    POSSIBLE D/C IN 1-2 DAYS, DEPENDING ON CLINICAL CONDITION.   Vaughan Basta M.D on 07/06/2018   Between 7am to 6pm - Pager - [phone]  After 6pm go to [email or web address] - password [email or web address] [facility]  Office  [phone]  CC: Primary care physician; Leonel Ramsay, MD  Note: This dictation was prepared with Dragon dictation along with smaller phrase technology. Any transcriptional errors that result from this process are unintentional.

## 2018-07-06 NOTE — Plan of Care (Signed)

## 2018-07-06 NOTE — Progress Notes (Addendum)
Inpatient Diabetes Program Recommendations  AACE/ADA: New Consensus Statement on Inpatient Glycemic Control (2019)  Target Ranges:  Prepandial:   less than 140 mg/dL      Peak postprandial:   less than 180 mg/dL (1-2 hours)      Critically ill patients:  140 - 180 mg/dL   Results for Carolyn Valdez, Carolyn Valdez (MRN [id number]) as of 07/06/2018 12:25  Ref. Range 07/05/2018 07:54 07/05/2018 11:20 07/05/2018 15:50 07/05/2018 22:17 07/06/2018 05:26 07/06/2018 07:52 07/06/2018 12:15  Glucose-Capillary Latest Ref Range: 70 - 99 mg/dL 224 (H) 215 (H) 153 (H) 143 (H) 158 (H) 147 (H) 132 (H)   Review of Glycemic Control  Diabetes history: DM2 Outpatient Diabetes medications: Lantus 30-40 units QHS when needed (if glucose is less than 100 mg/dl does not take), Metformin 1000 mg BID, Novolog listed on home med list but patient reports she is NOT taking Novolog Current orders for Inpatient glycemic control: Novolog 0-9 units TID with meals, Novolog 0-5 units QHS  NOTE: Per chart, patient was emergently started on renal replacement therapy and CRRT stopped yesterday.  CBGs improved over last 12-16 hours just using Novolog correction scale. No recommendations for changes at this time.   Addendum 07/06/18@15 :04-Spoke with patient regarding DM control. Patient states that she takes Lantus 30-40 units QHS when needed (if glucose is less than 100 mg/dl does not take any Lantus) and Metformin 1000 mg BID as an outpatient for DM control. Inquired about Novolog and patient reports that she is NOT taking Novolog at home.  Patient reports checking glucose twice a day and glucose is 90-115 mg/dl most mornings and 100-150 mg/dl in the evening.  Patient reports that she does have hypoglycemia at times and it usually occurs in the mornings. Encouraged patient to closely monitor glucose and keep a good record of how much Lantus she takes (or note if she did not take any) which she needs to take to follow up appointments with her PCP. Patient  verbalized understanding of information discussed and states that she has no further questions about DM at this time.   Thanks, Barnie Alderman, RN, MSN, CDE Diabetes Coordinator Inpatient Diabetes Program [phone] (Team Pager from 8am to 5pm)

## 2018-07-06 NOTE — Progress Notes (Signed)
Pharmacy Antibiotic Note  Carolyn Valdez is a 77 y.o. female admitted on 07/04/2018 with UTI.  Pharmacy has been consulted for Levaquin dosing.  Plan: Levaquin 250mg  PO daily for 3 days.  Height: 5\' 7"  (170.2 cm) Weight: 272 lb 0.8 oz (123.4 kg) IBW/kg (Calculated) : 61.6  Temp (24hrs), Avg:98.2 F (36.8 C), Min:97.5 F (36.4 C), Max:98.7 F (37.1 C)  Recent Labs  Lab 07/04/18 1908 07/04/18 2325 07/05/18 0310 07/05/18 0422 07/05/18 0423 07/05/18 0904 07/05/18 1514 07/06/18 0434  WBC 8.8 8.8  --  10.0  --   --   --  7.4  CREATININE 5.11* 5.26* 5.05*  --  5.14* 4.10* 3.15* 3.23*    Estimated Creatinine Clearance: 20.5 mL/min (A) (by C-G formula based on SCr of 3.23 mg/dL (H)).    Allergies  Allergen Reactions  . Sulfa Antibiotics Hives  . Biaxin [Clarithromycin] Hives  . Influenza A (H1n1) Monoval Vac Other (See Comments)    Pt states that she was told by her MD not to get the influenza vaccine.    . Morphine Other (See Comments)    Reaction:  Dizziness and confusion   . Pyridium [Phenazopyridine Hcl] Other (See Comments)    Reaction:  Unknown   . Ceftriaxone Anxiety  . Latex Rash  . Prednisone Rash  . Tape Rash    Thank you for allowing pharmacy to be a part of this patient's care.  Paulina Fusi, PharmD, BCPS 07/06/2018 12:06 PM

## 2018-07-06 NOTE — Progress Notes (Signed)
[facility]  ROUNDING NOTE   Subjective:   Transferred out of ICU. Stopped CRRT yesterday.   Patient refusing labs this morning.   PRBC transfusion. Hemoglobin 6.4   K 4.7   UOP 1100  Objective:  Vital signs in last 24 hours:  Temp:  [97.5 F (36.4 C)-98.7 F (37.1 C)] 98.6 F (37 C) (09/19 1028) Pulse Rate:  [65-76] 72 (09/19 1028) Resp:  [16-22] 18 (09/19 1028) BP: (105-156)/(37-111) 155/51 (09/19 1028) SpO2:  [93 %-100 %] 99 % (09/19 1028) Weight:  [123.4 kg] 123.4 kg (09/19 0534)  Weight change: 1.5 kg Filed Weights   07/04/18 2200 07/05/18 0500 07/06/18 0534  Weight: 121.9 kg 121.9 kg 123.4 kg    Intake/Output: I/O last 3 completed shifts: In: 700.8 [P.O.:120; I.V.:277.1; IV Piggyback:303.8] Out: 2297 [Urine:1250; Other:417]   Intake/Output this shift:  Total I/O In: 101.3 [I.V.:101.3] Out: -   Physical Exam: General: Critically ill  Head: Normocephalic, atraumatic. Moist oral mucosal membranes  Eyes: Anicteric, PERRL  Neck: Supple, trachea midline  Lungs:  Clear to auscultation, Lilly   Heart: irregular  Abdomen:  Soft, nontender, obese  Extremities: + peripheral edema.  Neurologic: Nonfocal, moving all four extremities  Skin: No lesions  Access: Left femoral temp HD catheter 9/89    Basic Metabolic Panel: Recent Labs  Lab 07/04/18 2325 07/05/18 0310 07/05/18 0422 07/05/18 0423 07/05/18 0904 07/05/18 1514 07/06/18 0434  NA 135 137  --  137 137 138 138  K >7.5* 7.3*  --  >7.5* 5.5* 4.2 4.7  CL 105 106  --  105 104 105 104  CO2 16* 15*  --  16* 23 25 25   GLUCOSE 239* 233*  --  223* 251* 170* 190*  BUN 80* 79*  --  82* 67* 52* 52*  CREATININE 5.26* 5.05*  --  5.14* 4.10* 3.15* 3.23*  CALCIUM 8.6* 8.8*  --  9.0 8.8* 8.5* 8.4*  MG 2.4  --  2.9*  --  2.5* 2.2 2.3  PHOS  --  8.3*  --  8.2* 6.3* 4.7* 4.2    Liver Function Tests: Recent Labs  Lab 07/04/18 1908 07/05/18 0310 07/05/18 0423 07/05/18 0904 07/05/18 1514  07/06/18 0434  AST 85*  --   --   --   --   --   ALT 61*  --   --   --   --   --   ALKPHOS 78  --   --   --   --   --   BILITOT 0.4  --   --   --   --   --   PROT 7.0  --   --   --   --   --   ALBUMIN 3.8 3.6 3.6 3.5 3.4* 3.1*   Recent Labs  Lab 07/04/18 1908  LIPASE 63*   No results for input(s): AMMONIA in the last 168 hours.  CBC: Recent Labs  Lab 07/04/18 1908 07/04/18 2325 07/05/18 0422 07/06/18 0434  WBC 8.8 8.8 10.0 7.4  NEUTROABS  --   --   --  4.3  HGB 8.0* 7.4* 7.3* 6.4*  HCT 25.1* 23.0* 22.5* 19.1*  MCV 93.9 92.3 93.4 90.9  PLT 339 314 301 234    Cardiac Enzymes: Recent Labs  Lab 07/04/18 1908  TROPONINI <0.03    BNP: Invalid input(s): POCBNP  CBG: Recent Labs  Lab 07/05/18 1120 07/05/18 1550 07/05/18 2217 07/06/18 0526 07/06/18 0752  GLUCAP 215* 153* 143* 158*  60*    Microbiology: Results for orders placed or performed during the hospital encounter of 07/04/18  MRSA PCR Screening     Status: None   Collection Time: 07/04/18 10:10 PM  Result Value Ref Range Status   MRSA by PCR NEGATIVE NEGATIVE Final    Comment:        The GeneXpert MRSA Assay (FDA approved for NASAL specimens only), is one component of a comprehensive MRSA colonization surveillance program. It is not intended to diagnose MRSA infection nor to guide or monitor treatment for MRSA infections. Performed at [facility], [address] [place], [place] [address]   Culture, Urine     Status: None (Preliminary result)   Collection Time: 07/05/18  5:13 AM  Result Value Ref Range Status   Specimen Description   Final    URINE, RANDOM Performed at [facility], [address], [place], [place] [address]    Special Requests   Final    NONE Performed at [facility], [address] [place], [place] [address]    Culture   Final    CULTURE REINCUBATED FOR BETTER GROWTH Performed at [facility], [place] [address] [place],  [place] [address]    Report Status PENDING  Incomplete    Coagulation Studies: Recent Labs    07/05/18 0422  LABPROT 18.5*  INR 1.56    Urinalysis: Recent Labs    07/04/18 [place] 1.018  PHURINE 5.0  GLUCOSEU NEGATIVE  HGBUR NEGATIVE  BILIRUBINUR NEGATIVE  KETONESUR NEGATIVE  PROTEINUR 100*  NITRITE NEGATIVE  LEUKOCYTESUR MODERATE*      Imaging: Ct Head Wo Contrast  Result Date: 07/04/2018 CLINICAL DATA:  Weakness and nausea EXAM: CT HEAD WITHOUT CONTRAST TECHNIQUE: Contiguous axial images were obtained from the base of the skull through the vertex without intravenous contrast. 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 No acute intracranial hemorrhage, intra-axial mass nor extra-axial fluid collections. No edema or midline shift. No hydrocephalus. Midline fourth ventricle and basal cisterns without effacement. Intact brainstem and cerebellum. Vascular: No hyperdense vessel sign.  No unexpected calcifications. Skull: No acute skull fracture or suspicious osseous lesions. Sinuses/Orbits: Intact orbits and globes.  Clear paranasal sinuses. Other: Clear mastoids. IMPRESSION: No acute intracranial abnormality. 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 Electronically Signed   By: Ashley Royalty M.D.   On: 07/04/2018 20:12   US Renal  Result Date: 07/05/2018 CLINICAL DATA:  Acute renal failure EXAM: RENAL ULTRASOUND COMPARISON:  February 01, 2018 FINDINGS: Right Kidney: Length: 10.3 cm. Echogenicity and renal cortical thickness are within normal limits. No mass, perinephric fluid, or hydronephrosis visualized. There is an apparent calculus in the upper pole of the right kidney measuring 1.2 cm in length. No ureterectasis. Left Kidney: Length: 10.5 cm. Echogenicity and renal cortical thickness are within normal limits. No mass, perinephric fluid, or hydronephrosis visualized. No  sonographically demonstrable calculus or ureterectasis. Bladder: Decompressed with Foley catheter and cannot be assessed. IMPRESSION: 1.2 cm calculus upper pole right kidney, nonobstructing. Kidneys otherwise appear unremarkable. In particular, no obstructing focus. Renal echogenicity and cortical thickness within normal limits bilaterally. Electronically Signed   By: Lowella Grip III M.D.   On: 07/05/2018 10:54   Dg Chest Portable 1 View  Result Date: 07/04/2018 CLINICAL DATA:  Weakness, nausea, bradycardia, and hypotension. EXAM: PORTABLE CHEST 1 VIEW COMPARISON:  04/22/2018 FINDINGS: Stable mild-to-moderate cardiomegaly. Aortic atherosclerosis. No evidence of  acute infiltrate or edema. No evidence of pleural effusion. IMPRESSION: Stable cardiomegaly.  No active lung disease. Electronically Signed   By: Earle Gell M.D.   On: 07/04/2018 20:10     Medications:   . sodium chloride    . pureflow 3 each (07/05/18 1238)   . acidophilus  1 capsule Oral QHS  . aspirin EC  81 mg Oral Daily  . citalopram  10 mg Oral Daily  . docusate sodium  100 mg Oral BID  . heparin injection (subcutaneous)  5,000 Units Subcutaneous Q8H  . insulin aspart  0-5 Units Subcutaneous QHS  . insulin aspart  0-9 Units Subcutaneous TID WC  . iron polysaccharides  150 mg Oral TID  . lamoTRIgine  25 mg Oral Daily  . levETIRAcetam  250 mg Oral BID  . loratadine  10 mg Oral Daily  . mouth rinse  15 mL Mouth Rinse BID  . Melatonin  5 mg Oral QHS  . OLANZapine zydis  10 mg Oral QHS  . pantoprazole  40 mg Oral BID  . polyethylene glycol  17 g Oral Daily  . senna  1 tablet Oral Daily  . sodium chloride flush  3 mL Intravenous Q12H   sodium chloride, acetaminophen **OR** acetaminophen, albuterol, bisacodyl, heparin, ondansetron **OR** ondansetron (ZOFRAN) IV, senna-docusate, sodium chloride flush  Assessment/ Plan:  Carolyn Valdez is a 76 y.o. white female with congestive heart failure, hypertension, diabetes  mellitus type II, obstructive sleep apnea, nephrolithiasis who has been admitted to [facility] on 07/04/2018 for acute renal failure, hyperkalemia, metabolic acidosis requiring renal replacement therapy. Taking potassium chloride PO at home.   1. 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  2. Hyperkalemia 3. Metabolic Acidosis 4. Acute exacerbation of diastolic congestive failure 5. Normocytic Anemia 6. Hyperphosphatemia 7. Diabetes mellitus type II with chronic kidney disease: hemoglobin A1c 7.6% on 04/22/18  Plan Emergently started on renal replacement therapy. CRRT stopped yesterday.  No indication for dialysis at this time. Nonoliguric urine output.  Monitor for dialysis need.    LOS: 2 Jrake Rodriquez 9/19/201911:23 AM

## 2018-07-06 NOTE — Progress Notes (Signed)
SUBJECTIVE: Patient is very lethargic and sleepy I have to.   Vitals:   07/06/18 0534 07/06/18 0948 07/06/18 0956 07/06/18 1028  BP:  (!) 132/111 (!) 140/52 (!) 155/51  Pulse:  67 73 72  Resp:  20 18 18   Temp:  98.3 F (36.8 C) 98.4 F (36.9 C) 98.6 F (37 C)  TempSrc:  Oral Oral Oral  SpO2:  98% 97% 99%  Weight: 123.4 kg     Height:        Intake/Output Summary (Last 24 hours) at 07/06/2018 1142 Last data filed at 07/06/2018 1044 Gross per 24 hour  Intake 305.38 ml  Output 1154 ml  Net -848.62 ml    LABS: Basic Metabolic Panel: Recent Labs    07/05/18 1514 07/06/18 0434  NA 138 138  K 4.2 4.7  CL 105 104  CO2 25 25  GLUCOSE 170* 190*  BUN 52* 52*  CREATININE 3.15* 3.23*  CALCIUM 8.5* 8.4*  MG 2.2 2.3  PHOS 4.7* 4.2   Liver Function Tests: Recent Labs    07/04/18 1908  07/05/18 1514 07/06/18 0434  AST 85*  --   --   --   ALT 61*  --   --   --   ALKPHOS 78  --   --   --   BILITOT 0.4  --   --   --   PROT 7.0  --   --   --   ALBUMIN 3.8   < > 3.4* 3.1*   < > = values in this interval not displayed.   Recent Labs    07/04/18 1908  LIPASE 63*   CBC: Recent Labs    07/05/18 0422 07/06/18 0434  WBC 10.0 7.4  NEUTROABS  --  4.3  HGB 7.3* 6.4*  HCT 22.5* 19.1*  MCV 93.4 90.9  PLT 301 234   Cardiac Enzymes: Recent Labs    07/04/18 1908  TROPONINI <0.03   BNP: Invalid input(s): POCBNP D-Dimer: No results for input(s): DDIMER in the last 72 hours. Hemoglobin A1C: No results for input(s): HGBA1C in the last 72 hours. Fasting Lipid Panel: No results for input(s): CHOL, HDL, LDLCALC, TRIG, CHOLHDL, LDLDIRECT in the last 72 hours. Thyroid Function Tests: No results for input(s): TSH, T4TOTAL, T3FREE, THYROIDAB in the last 72 hours.  Invalid input(s): FREET3 Anemia Panel: No results for input(s): VITAMINB12, FOLATE, FERRITIN, TIBC, IRON, RETICCTPCT in the last 72 hours.   PHYSICAL EXAM General: Well developed, well nourished, in no acute  distress HEENT:  Normocephalic and atramatic Neck:  No JVD.  Lungs: Clear bilaterally to auscultation and percussion. Heart: HRRR . Normal S1 and S2 without gallops or murmurs.  Abdomen: Bowel sounds are positive, abdomen soft and non-tender  Msk:  Back normal, normal gait. Normal strength and tone for age. Extremities: No clubbing, cyanosis or edema.   Neuro: Alert and oriented X 3. Psych:  Good affect, responds appropriately  TELEMETRY: Sinus bradycardia about 50 bpm  ASSESSMENT AND PLAN: Acute renal failure with hyperkalemia and associated sinus bradycardia yesterday had a heart rate of 60-70 and now is about 50.  Patient also has severe anemia and getting blood transfusion.  If the electrolyte imbalance is improved and still has bradycardia may have to insert pacemaker we will watch the patient in the meantime.  Active Problems:   Hyperkalemia    Dionisio David, MD, [facility] 07/06/2018 11:42 AM

## 2018-07-06 NOTE — Progress Notes (Signed)
Family Meeting Note  Advance Directive:yes  Today a meeting took place with the sister on phone.  Patient is unable to participate due IH:[id number] capacity drowsy   The following clinical team members were present during this meeting:MD  The following were discussed:Patient's diagnosis: Acute on chronic renal failure, hyperkalemia, congestive heart failure, recurrent admissions to hospital, altered mental status, Patient's progosis: Unable to determine and Goals for treatment: DNR  Discussed with patient's sister on the phone as she is in [place] currently, as per her patient has recurrent admissions to the hospital for similar reasons, she goes to rehab after hospital and once home she does not take care of herself and return to hospital with similar problem.  She does not see any good outcome from the situation.  As per her patient is not safe living alone at home and she agrees that patient should be having palliative or hospice level of care.  Additional follow-up to be provided: Nephrology, cardiology  Time spent during discussion:20 minutes  Vaughan Basta, MD

## 2018-07-07 ENCOUNTER — Inpatient Hospital Stay: Payer: Medicare Other

## 2018-07-07 LAB — BASIC METABOLIC PANEL
Anion gap: 9 (ref 5–15)
BUN: 50 mg/dL — ABNORMAL HIGH (ref 8–23)
CHLORIDE: 103 mmol/L (ref 98–111)
CO2: 26 mmol/L (ref 22–32)
Calcium: 8.3 mg/dL — ABNORMAL LOW (ref 8.9–10.3)
Creatinine, Ser: 2.68 mg/dL — ABNORMAL HIGH (ref 0.44–1.00)
GFR calc non Af Amer: 16 mL/min — ABNORMAL LOW (ref >60)
GFR, EST AFRICAN AMERICAN: 19 mL/min — AB (ref >60)
Glucose, Bld: 253 mg/dL — ABNORMAL HIGH (ref 70–99)
Potassium: 4.3 mmol/L (ref 3.5–5.1)
Sodium: 138 mmol/L (ref 135–145)

## 2018-07-07 LAB — BPAM RBC
Blood Product Expiration Date: 201910162359
Blood Product Expiration Date: 201910162359
ISSUE DATE / TIME: 201909191005
ISSUE DATE / TIME: 201909191443
Unit Type and Rh: 5100
Unit Type and Rh: 5100

## 2018-07-07 LAB — CBC
HCT: 24.4 % — ABNORMAL LOW (ref 35.0–47.0)
HEMOGLOBIN: 8.3 g/dL — AB (ref 12.0–16.0)
MCH: 30.4 pg (ref 26.0–34.0)
MCHC: 34.1 g/dL (ref 32.0–36.0)
MCV: 89.1 fL (ref 80.0–100.0)
Platelets: 232 10*3/uL (ref 150–440)
RBC: 2.74 MIL/uL — AB (ref 3.80–5.20)
RDW: 18.8 % — ABNORMAL HIGH (ref 11.5–14.5)
WBC: 6.7 10*3/uL (ref 3.6–11.0)

## 2018-07-07 LAB — BLOOD GAS, VENOUS
ACID-BASE DEFICIT: 14.9 mmol/L — AB (ref 0.0–2.0)
BICARBONATE: 13.3 mmol/L — AB (ref 20.0–28.0)
PATIENT TEMPERATURE: 37
PCO2 VEN: 41 mmHg — AB (ref 44.0–60.0)
pH, Ven: 7.12 — CL (ref 7.250–7.430)

## 2018-07-07 LAB — URINE CULTURE

## 2018-07-07 LAB — GLUCOSE, CAPILLARY
GLUCOSE-CAPILLARY: 391 mg/dL — AB (ref 70–99)
GLUCOSE-CAPILLARY: 297 mg/dL — AB (ref 70–99)
Glucose-Capillary: 257 mg/dL — ABNORMAL HIGH (ref 70–99)
Glucose-Capillary: 333 mg/dL — ABNORMAL HIGH (ref 70–99)

## 2018-07-07 LAB — TYPE AND SCREEN
ABO/RH(D): O POS
Antibody Screen: NEGATIVE
Unit division: 0
Unit division: 0

## 2018-07-07 MED ORDER — FUROSEMIDE 10 MG/ML IJ SOLN
20.0000 mg | Freq: Two times a day (BID) | INTRAMUSCULAR | Status: DC
  Administered 2018-07-07: 10:00:00 20 mg via INTRAVENOUS
  Filled 2018-07-07: qty 2

## 2018-07-07 MED ORDER — FUROSEMIDE 10 MG/ML IJ SOLN
40.0000 mg | Freq: Two times a day (BID) | INTRAMUSCULAR | Status: DC
  Administered 2018-07-07 – 2018-07-08 (×3): 40 mg via INTRAVENOUS
  Filled 2018-07-07 (×3): qty 4

## 2018-07-07 MED ORDER — AMOXICILLIN 500 MG PO CAPS
500.0000 mg | ORAL_CAPSULE | Freq: Two times a day (BID) | ORAL | Status: DC
  Administered 2018-07-07 – 2018-07-14 (×15): 500 mg via ORAL
  Filled 2018-07-07 (×16): qty 1

## 2018-07-07 NOTE — Progress Notes (Signed)
[facility]  ROUNDING NOTE   Subjective:   More alert and awake this morning. Complains of shortness of breath, edema  UOP 4323mL  Creatinine 2.68 (3.23)  K 4.3  Objective:  Vital signs in last 24 hours:  Temp:  [97.7 F (36.5 C)-98.8 F (37.1 C)] 98.5 F (36.9 C) (09/20 0359) Pulse Rate:  [61-84] 79 (09/20 0714) Resp:  [17-24] 19 (09/20 0359) BP: (142-180)/(50-68) 142/50 (09/20 0359) SpO2:  [92 %-99 %] 96 % (09/20 0714) Weight:  [125.2 kg] 125.2 kg (09/20 0500)  Weight change: 1.793 kg Filed Weights   07/05/18 0500 07/06/18 0534 07/07/18 0500  Weight: 121.9 kg 123.4 kg 125.2 kg    Intake/Output: I/O last 3 completed shifts: In: 1184.4 [P.O.:480; I.V.:370.4; Blood:334] Out: 1749 [Urine:4900]   Intake/Output this shift:  No intake/output data recorded.  Physical Exam: General: Critically ill  Head: Normocephalic, atraumatic. Moist oral mucosal membranes  Eyes: Anicteric, PERRL  Neck: Supple, trachea midline  Lungs:  Clear to auscultation, [place]   Heart: irregular  Abdomen:  Soft, nontender, obese  Extremities: + peripheral edema.  Neurologic: Nonfocal, moving all four extremities  Skin: No lesions  Access: Left femoral temp HD catheter 4/49    Basic Metabolic Panel: Recent Labs  Lab 07/04/18 2325 07/05/18 0310 07/05/18 0422 07/05/18 0423 07/05/18 0904 07/05/18 1514 07/06/18 0434 07/07/18 0443  NA 135 137  --  137 137 138 138 138  K >7.5* 7.3*  --  >7.5* 5.5* 4.2 4.7 4.3  CL 105 106  --  105 104 105 104 103  CO2 16* 15*  --  16* 23 25 25 26   GLUCOSE 239* 233*  --  223* 251* 170* 190* 253*  BUN 80* 79*  --  82* 67* 52* 52* 50*  CREATININE 5.26* 5.05*  --  5.14* 4.10* 3.15* 3.23* 2.68*  CALCIUM 8.6* 8.8*  --  9.0 8.8* 8.5* 8.4* 8.3*  MG 2.4  --  2.9*  --  2.5* 2.2 2.3  --   PHOS  --  8.3*  --  8.2* 6.3* 4.7* 4.2  --     Liver Function Tests: Recent Labs  Lab 07/04/18 1908  07/05/18 0423 07/05/18 0904 07/05/18 1514 07/06/18 0434  07/06/18 1454  AST 85*  --   --   --   --   --  83*  ALT 61*  --   --   --   --   --  101*  ALKPHOS 78  --   --   --   --   --  78  BILITOT 0.4  --   --   --   --   --  0.6  PROT 7.0  --   --   --   --   --  5.7*  ALBUMIN 3.8   < > 3.6 3.5 3.4* 3.1* 3.2*   < > = values in this interval not displayed.   Recent Labs  Lab 07/04/18 1908  LIPASE 63*   Recent Labs  Lab 07/06/18 2000  AMMONIA 22    CBC: Recent Labs  Lab 07/04/18 1908 07/04/18 2325 07/05/18 0422 07/06/18 0434 07/07/18 0443  WBC 8.8 8.8 10.0 7.4 6.7  NEUTROABS  --   --   --  4.3  --   HGB 8.0* 7.4* 7.3* 6.4* 8.3*  HCT 25.1* 23.0* 22.5* 19.1* 24.4*  MCV 93.9 92.3 93.4 90.9 89.1  PLT 339 314 301 234 232    Cardiac Enzymes: Recent Labs  Lab 07/04/18 1908  TROPONINI <0.03    BNP: Invalid input(s): POCBNP  CBG: Recent Labs  Lab 07/06/18 0752 07/06/18 1215 07/06/18 1718 07/06/18 2102 07/07/18 0755  GLUCAP 147* 132* 237* 268* 45*    Microbiology: Results for orders placed or performed during the hospital encounter of 07/04/18  MRSA PCR Screening     Status: None   Collection Time: 07/04/18 10:10 PM  Result Value Ref Range Status   MRSA by PCR NEGATIVE NEGATIVE Final    Comment:        The GeneXpert MRSA Assay (FDA approved for NASAL specimens only), is one component of a comprehensive MRSA colonization surveillance program. It is not intended to diagnose MRSA infection nor to guide or monitor treatment for MRSA infections. Performed at [facility], [address] [place], [place] [address]   Culture, Urine     Status: Abnormal   Collection Time: 07/05/18  5:13 AM  Result Value Ref Range Status   Specimen Description   Final    URINE, RANDOM Performed at [facility], [address] [place], [place] [address]    Special Requests   Final    NONE Performed at [facility], [place], [place] [address]    Culture 30,000 COLONIES/mL  ENTEROCOCCUS FAECALIS (A)  Final   Report Status 07/07/2018 FINAL  Final   Organism ID, Bacteria ENTEROCOCCUS FAECALIS (A)  Final      Susceptibility   Enterococcus faecalis - MIC*    AMPICILLIN <=2 SENSITIVE Sensitive     LEVOFLOXACIN >=8 RESISTANT Resistant     NITROFURANTOIN 64 INTERMEDIATE Intermediate     VANCOMYCIN 1 SENSITIVE Sensitive     * 30,000 COLONIES/mL ENTEROCOCCUS FAECALIS    Coagulation Studies: Recent Labs    07/05/18 0422  LABPROT 18.5*  INR 1.56    Urinalysis: Recent Labs    07/04/18 2000  COLORURINE AMBER*  LABSPEC 1.018  PHURINE 5.0  GLUCOSEU NEGATIVE  HGBUR NEGATIVE  BILIRUBINUR NEGATIVE  KETONESUR NEGATIVE  PROTEINUR 100*  NITRITE NEGATIVE  LEUKOCYTESUR MODERATE*      Imaging: No results found.   Medications:   . sodium chloride     . acidophilus  1 capsule Oral QHS  . amoxicillin  500 mg Oral Q12H  . aspirin EC  81 mg Oral Daily  . citalopram  10 mg Oral Daily  . docusate sodium  100 mg Oral BID  . furosemide  40 mg Intravenous Q12H  . heparin injection (subcutaneous)  5,000 Units Subcutaneous Q8H  . hydrALAZINE  25 mg Oral Q8H  . insulin aspart  0-5 Units Subcutaneous QHS  . insulin aspart  0-9 Units Subcutaneous TID WC  . iron polysaccharides  150 mg Oral TID  . lamoTRIgine  25 mg Oral Daily  . levETIRAcetam  250 mg Oral BID  . loratadine  10 mg Oral Daily  . mouth rinse  15 mL Mouth Rinse BID  . Melatonin  5 mg Oral QHS  . OLANZapine zydis  10 mg Oral QHS  . pantoprazole  40 mg Oral BID  . polyethylene glycol  17 g Oral Daily  . senna  1 tablet Oral Daily  . sodium chloride flush  3 mL Intravenous Q12H   sodium chloride, acetaminophen **OR** acetaminophen, albuterol, bisacodyl, ondansetron **OR** ondansetron (ZOFRAN) IV, senna-docusate, sodium chloride flush  Assessment/ Plan:  [name] is a 77 y.o. white female with congestive heart failure, hypertension, diabetes mellitus type II, obstructive  sleep apnea,  nephrolithiasis who has been admitted to [facility] on 07/04/2018 for acute renal failure, hyperkalemia, metabolic acidosis requiring renal replacement therapy. Taking potassium chloride PO at home.   1. 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  2. Hyperkalemia 3. Metabolic Acidosis 4. Acute exacerbation of diastolic congestive failure 5. Normocytic Anemia 6. Hyperphosphatemia 7. Diabetes mellitus type II with chronic kidney disease: hemoglobin A1c 7.6% on 04/22/18  Plan Emergently started on renal replacement therapy. CRRT stopped yesterday.  No indication for dialysis at this time. Nonoliguric urine output.  - increase furosemide to 40mg  IV bid - Remove hemodialysis catheter.    LOS: 3 Carolyn Valdez 9/20/201910:00 AM

## 2018-07-07 NOTE — Progress Notes (Addendum)
Patient is currently followed by outpatient Palliative at home [facility] made aware.  Thank you. Flo Shanks RN, BSN, [facility], hospital liaison [phone]

## 2018-07-07 NOTE — Progress Notes (Addendum)
SUBJECTIVE: Pt is sitting up in bed eating but she states she is very short of breath and has been up all night because she can't breath. "This is awful, I can't catch my breath."   Vitals:   07/07/18 0217 07/07/18 0359 07/07/18 0500 07/07/18 0714  BP: (!) 158/55 (!) 142/50    Pulse: 84 78  79  Resp:  19    Temp:  98.5 F (36.9 C)    TempSrc:  Oral    SpO2:  97%  96%  Weight:   125.2 kg   Height:        Intake/Output Summary (Last 24 hours) at 07/07/2018 0851 Last data filed at 07/07/2018 0500 Gross per 24 hour  Intake 1040.33 ml  Output 4300 ml  Net -3259.67 ml    LABS: Basic Metabolic Panel: Recent Labs    07/05/18 1514 07/06/18 0434 07/07/18 0443  NA 138 138 138  K 4.2 4.7 4.3  CL 105 104 103  CO2 25 25 26   GLUCOSE 170* 190* 253*  BUN 52* 52* 50*  CREATININE 3.15* 3.23* 2.68*  CALCIUM 8.5* 8.4* 8.3*  MG 2.2 2.3  --   PHOS 4.7* 4.2  --    Liver Function Tests: Recent Labs    07/04/18 1908  07/06/18 0434 07/06/18 1454  AST 85*  --   --  83*  ALT 61*  --   --  101*  ALKPHOS 78  --   --  78  BILITOT 0.4  --   --  0.6  PROT 7.0  --   --  5.7*  ALBUMIN 3.8   < > 3.1* 3.2*   < > = values in this interval not displayed.   Recent Labs    07/04/18 1908  LIPASE 63*   CBC: Recent Labs    07/06/18 0434 07/07/18 0443  WBC 7.4 6.7  NEUTROABS 4.3  --   HGB 6.4* 8.3*  HCT 19.1* 24.4*  MCV 90.9 89.1  PLT 234 232   Cardiac Enzymes: Recent Labs    07/04/18 1908  TROPONINI <0.03   BNP: Invalid input(s): POCBNP D-Dimer: No results for input(s): DDIMER in the last 72 hours. Hemoglobin A1C: No results for input(s): HGBA1C in the last 72 hours. Fasting Lipid Panel: No results for input(s): CHOL, HDL, LDLCALC, TRIG, CHOLHDL, LDLDIRECT in the last 72 hours. Thyroid Function Tests: No results for input(s): TSH, T4TOTAL, T3FREE, THYROIDAB in the last 72 hours.  Invalid input(s): FREET3 Anemia Panel: No results for input(s): VITAMINB12, FOLATE, FERRITIN,  TIBC, IRON, RETICCTPCT in the last 72 hours.   PHYSICAL EXAM General: Sickly appearance, lethargic, increased work of breathing. HEENT:  Normocephalic and atramatic Neck:  No JVD.  Lungs: Clear bilaterally, increased work of breathing noted.  Heart: HRRR . Normal S1 and S2 without gallops or murmurs.  Abdomen: Bowel sounds are positive, abdomen soft and non-tender  Msk:  Back normal, normal gait. Normal strength and tone for age. Extremities: Left femoral dialysis cath in place. 2+ non pitting bilateral edema of ankles.    Neuro: Alert and oriented X 3. Psych:  Appears concerned about shortness of breath.  TELEMETRY: NSR 79bpm  ASSESSMENT AND PLAN: Acute renal failure with associated hyperkalemia and bradycardia, potassium now corrected in NSR, rate in the 70s. Anemia is slightly improved after blood transfusion, Hgb 8.3. Pt is very short of breath. 8am IV lasix is overdue. Will have lasix administered and increase oxygen until work of breathing improves.Continue lasix and hydralazine. Continue  to monitor HR.    Active Problems:   Hyperkalemia    Jake Bathe, NP-C 07/07/2018 8:51 AM Cell: [phone]

## 2018-07-07 NOTE — Progress Notes (Signed)
PT Cancellation Note  Patient Details Name: Carolyn Valdez MRN: [id number] DOB: 1941/01/23   Cancelled Treatment:    Reason Eval/Treat Not Completed: Other (comment).  PT consult received.  Chart reviewed.  Pt reporting needing to use bed pan and had already called nursing for assist.  Will re-attempt PT evaluation at a later date/time.  Leitha Bleak, PT 07/07/18, 3:42 PM [phone]

## 2018-07-07 NOTE — Progress Notes (Signed)
Pt complaining of SOB. MD aware. Order to ween O2. O2 running at two liters. Cardiology PA increased O2 to 3 liters. Awaiting furosemide from pharmacy.

## 2018-07-07 NOTE — Care Management Note (Signed)
Case Management Note  Patient Details  Name: Carolyn Valdez MRN: [id number] Date of Birth: 03-12-1941  Subjective/Objective:   Admitted to [facility] with the diagnosis of hyperkalemia. Lives alone. [facility] (Dr. Keturah Barre)  comes to her home. Prescriptions are filled at [facility].                Home Health per [facility] is currently in the home. [facility] x 2 (last  Admission 3 weeks ago). [facility] x 3, [facility] x 1. Home oxygen is in the home. States she hasn't used it in years. Chair lift, wheelchair, rolling walker, and cane in the home. States she gets   personal care services for baths two times a week. A person cleans twice a week. Medical Alert necklace. No falls. Unsure about transportation home.   Action/Plan: Received referral for outpatient palliative. States she is in agreement for this service. Will update Flo Shanks RN representative for [facility]. Requested physical therapy evaluation   Expected Discharge Date:  07/06/18               Expected Discharge Plan:     In-House Referral:   yes  Discharge planning Services  CM Consult  Post Acute Care Choice:  Resumption of Svcs/PTA Provider, Home Health Choice offered to:     DME Arranged:    DME Agency:     HH Arranged:  RN, OT, PT, Nurse's Aide, Speech Therapy, Social Work [employer] Agency:     Status of Service:  In process, will continue to follow  If discussed at Long Length of Stay Meetings, dates discussed:    Additional Comments:  Shelbie Ammons, RN MSN CCM Care Management [phone] 07/07/2018, 8:36 AM

## 2018-07-07 NOTE — Progress Notes (Signed)
PT Cancellation Note  Patient Details Name: JAASIA VIGLIONE MRN: [id number] DOB: May 24, 1941   Cancelled Treatment:    Reason Eval/Treat Not Completed: Patient not medically ready.  PT consult received.  Chart reviewed.  Nurse reports temporary fem dialysis cath recently removed (pt not appropriate yet for therapy).  Will re-attempt PT evaluation at a later date/time.  Leitha Bleak, PT 07/07/18, 11:35 AM [phone]

## 2018-07-07 NOTE — Progress Notes (Addendum)
Inpatient Diabetes Program Recommendations  AACE/ADA: New Consensus Statement on Inpatient Glycemic Control (2019)  Target Ranges:  Prepandial:   less than 140 mg/dL      Peak postprandial:   less than 180 mg/dL (1-2 hours)      Critically ill patients:  140 - 180 mg/dL   Results for ELLIONNA, BUCKBEE (MRN [id number]) as of 07/07/2018 11:44  Ref. Range 07/06/2018 07:52 07/06/2018 12:15 07/06/2018 17:18 07/06/2018 21:02 07/07/2018 07:55  Glucose-Capillary Latest Ref Range: 70 - 99 mg/dL 147 (H) 132 (H) 237 (H) 268 (H) 257 (H)   Review of Glycemic Control  Diabetes history:DM2 Outpatient Diabetes medications:Lantus 30-40 units QHS when needed (if glucose is less than 100 mg/dl does not take), Metformin 1000 mg BID, Novolog listed on home med list but patient reports she is NOT taking Novolog Current orders for Inpatient glycemic control:Novolog 0-9 units TID with meals, Novolog 0-5 units QHS  Inpatient Diabetes Program Recommendations:  Insulin - Basal: Please consider ordering Levemir 10 units Q24H.  Thanks, Barnie Alderman, RN, MSN, CDE Diabetes Coordinator Inpatient Diabetes Program [phone] (Team Pager from 8am to 5pm)

## 2018-07-07 NOTE — Progress Notes (Signed)
[place] [facility] [place] NAME: Carolyn Valdez    MR#:  [id number]  DATE OF BIRTH:  11/06/1940  SUBJECTIVE:  CHIEF COMPLAINT:   Chief Complaint  Patient presents with  . Weakness   Came with feeling weak, noted bradycardia- High K. Had CRRT- K came down, HR stable now. Feels weak still. Had shortness of breath last night and could not sleep but much more alert today.  REVIEW OF SYSTEMS:    Review of Systems  Constitutional: Positive for malaise/fatigue. Negative for fever and weight loss.  HENT: Negative for congestion, ear discharge and sore throat.   Eyes: Negative for blurred vision and discharge.  Respiratory: Positive for shortness of breath. Negative for sputum production.   Cardiovascular: Positive for leg swelling. Negative for chest pain, palpitations and orthopnea.  Gastrointestinal: Negative for abdominal pain, diarrhea, nausea and vomiting.  Genitourinary: Negative for dysuria and frequency.  Musculoskeletal: Negative for joint pain and myalgias.  Skin: Negative for rash.  Neurological: Negative for dizziness, focal weakness, seizures and headaches.  Psychiatric/Behavioral: Negative for depression.    DRUG ALLERGIES:   Allergies  Allergen Reactions  . Sulfa Antibiotics Hives  . Biaxin [Clarithromycin] Hives  . Influenza A (H1n1) Monoval Vac Other (See Comments)    Pt states that she was told by her MD not to get the influenza vaccine.    . Morphine Other (See Comments)    Reaction:  Dizziness and confusion   . Pyridium [Phenazopyridine Hcl] Other (See Comments)    Reaction:  Unknown   . Ceftriaxone Anxiety  . Latex Rash  . Prednisone Rash  . Tape Rash    VITALS:  Blood pressure (!) 180/71, pulse 75, temperature 98.5 F (36.9 C), temperature source Oral, resp. rate 19, height 5\' 7"  (1.702 m), weight 125.2 kg, SpO2 96 %.  PHYSICAL EXAMINATION:  GENERAL:  77 y.o.-year-old patient lying in the bed with no acute distress.   EYES: Pupils equal, round, reactive to light and accommodation. No scleral icterus. Extraocular muscles intact.  HEENT: Head atraumatic, normocephalic. Oropharynx and nasopharynx clear.  NECK:  Supple, no jugular venous distention. No thyroid enlargement, no tenderness.  LUNGS: Normal breath sounds bilaterally, no wheezing, some crepitation. No use of accessory muscles of respiration.  CARDIOVASCULAR: S1, S2 normal. No murmurs, rubs, or gallops.  ABDOMEN: Soft, nontender, nondistended. Bowel sounds present. No organomegaly or mass.  EXTREMITIES: b/l pedal edema,no cyanosis, or clubbing. NEUROLOGIC: Pt is alert and oriented today, follow simple command, have generalized weakness, sensations intact.  PSYCHIATRIC: The patient is alert and oriented x3.  SKIN: No obvious rash, lesion, or ulcer.   Physical Exam LABORATORY PANEL:   CBC Recent Labs  Lab 07/07/18 0443  WBC 6.7  HGB 8.3*  HCT 24.4*  PLT 232   ------------------------------------------------------------------------------------------------------------------  Chemistries  Recent Labs  Lab 07/06/18 0434 07/06/18 1454 07/07/18 0443  NA 138  --  138  K 4.7  --  4.3  CL 104  --  103  CO2 25  --  26  GLUCOSE 190*  --  253*  BUN 52*  --  50*  CREATININE 3.23*  --  2.68*  CALCIUM 8.4*  --  8.3*  MG 2.3  --   --   AST  --  83*  --   ALT  --  101*  --   ALKPHOS  --  78  --   BILITOT  --  0.6  --    ------------------------------------------------------------------------------------------------------------------  Cardiac Enzymes Recent Labs  Lab 07/04/18 1908  TROPONINI <0.03   ------------------------------------------------------------------------------------------------------------------  RADIOLOGY:  Dg Chest 1 View  Result Date: 07/07/2018 CLINICAL DATA:  Hypoxia, shortness of breath EXAM: CHEST  1 VIEW COMPARISON:  06/03/2018 FINDINGS: Cardiomegaly with vascular congestion. Worsening diffuse interstitial  opacities, likely interstitial edema. Confluent opacity in the left lower lobe, atelectasis versus infiltrate. Suspect small effusions. No acute bony abnormality. IMPRESSION: Cardiomegaly with vascular congestion and worsening interstitial opacities, likely interstitial edema. Small bilateral effusions. Left lower lobe atelectasis or infiltrate. Electronically Signed   By: Rolm Baptise M.D.   On: 07/07/2018 10:37    ASSESSMENT AND PLAN:   Active Problems:   Hyperkalemia  * Hyperkalemia with bradycardia. Monitored in stepdown unit.  treated with calcium gluconate, bicarb, NovoLog and D50.    CRRT started by Nephro- helped. Potassium level and renal function improved now.  Dialysis catheter removed.  * Acute renal failure on CKD.   Due to dehydration Held Lasix on admission, improving after CRRT, cont to monitor.  *Altered mental status Likely due to UTI, also due to renal failure Vitals are stable and blood sugar is stable. I will check Keppra level. Started on treatment for UTI. Much improved, alert and oriented x3 today.  * Acute respiratory failure with hypoxia due to acute on chronic diastolic CHF ejection fraction 70%. Hold Lasix at this time due to renal failure. Cardiology consult.  Patient have increased pulmonary artery pressure per echocardiogram. Due to holding Lasix for last to 3 days, patient has worsening pulmonary edema today.  Resume IV Lasix to help with diuresis and continue to monitor renal function now.  *UTI Levaquin, follow urine culture. Culture grows enterococcus which is resistant to Levaquin, changed to amoxicillin per sensitivity.  * Hyponatremia.  Follow-up BMP.  Hold Lasix.   Improved.  *Acute on chronic anemia due to chronic renal disease stage III We will check for stool guaiac.  Yesterday patient's hemoglobin had dropped less than 7 and she has received transfusion now hemoglobin is stable.  All the records are reviewed and case discussed  with Care Management/Social Workerr. Management plans discussed with the patient, family and they are in agreement.  CODE STATUS: Full.  TOTAL TIME TAKING CARE OF THIS PATIENT: 35 minutes.    POSSIBLE D/C IN 1-2 DAYS, DEPENDING ON CLINICAL CONDITION.   Vaughan Basta M.D on 07/07/2018   Between 7am to 6pm - Pager - [phone]  After 6pm go to [email or web address] - password [email or web address] [facility]  Office  [phone]  CC: Primary care physician; Leonel Ramsay, MD  Note: This dictation was prepared with Dragon dictation along with smaller phrase technology. Any transcriptional errors that result from this process are unintentional.

## 2018-07-08 LAB — BASIC METABOLIC PANEL
Anion gap: 10 (ref 5–15)
BUN: 39 mg/dL — AB (ref 8–23)
CALCIUM: 9.2 mg/dL (ref 8.9–10.3)
CO2: 27 mmol/L (ref 22–32)
CREATININE: 2.08 mg/dL — AB (ref 0.44–1.00)
Chloride: 102 mmol/L (ref 98–111)
GFR calc non Af Amer: 22 mL/min — ABNORMAL LOW (ref >60)
GFR, EST AFRICAN AMERICAN: 26 mL/min — AB (ref >60)
Glucose, Bld: 271 mg/dL — ABNORMAL HIGH (ref 70–99)
Potassium: 4.3 mmol/L (ref 3.5–5.1)
SODIUM: 139 mmol/L (ref 135–145)

## 2018-07-08 LAB — CBC
HCT: 27.9 % — ABNORMAL LOW (ref 35.0–47.0)
Hemoglobin: 9.4 g/dL — ABNORMAL LOW (ref 12.0–16.0)
MCH: 30 pg (ref 26.0–34.0)
MCHC: 33.7 g/dL (ref 32.0–36.0)
MCV: 89.1 fL (ref 80.0–100.0)
PLATELETS: 245 10*3/uL (ref 150–440)
RBC: 3.13 MIL/uL — AB (ref 3.80–5.20)
RDW: 18.8 % — AB (ref 11.5–14.5)
WBC: 8.5 10*3/uL (ref 3.6–11.0)

## 2018-07-08 LAB — GLUCOSE, CAPILLARY
GLUCOSE-CAPILLARY: 323 mg/dL — AB (ref 70–99)
GLUCOSE-CAPILLARY: 294 mg/dL — AB (ref 70–99)
Glucose-Capillary: 331 mg/dL — ABNORMAL HIGH (ref 70–99)
Glucose-Capillary: 273 mg/dL — ABNORMAL HIGH (ref 70–99)

## 2018-07-08 LAB — LEVETIRACETAM LEVEL: LEVETIRACETAM: 13.5 ug/mL (ref 10.0–40.0)

## 2018-07-08 MED ORDER — LORAZEPAM 1 MG PO TABS
1.0000 mg | ORAL_TABLET | Freq: Once | ORAL | Status: AC
  Administered 2018-07-08: 1 mg via ORAL
  Filled 2018-07-08: qty 1

## 2018-07-08 MED ORDER — METOPROLOL TARTRATE 25 MG PO TABS
25.0000 mg | ORAL_TABLET | Freq: Two times a day (BID) | ORAL | Status: DC
  Administered 2018-07-08 – 2018-07-14 (×13): 25 mg via ORAL
  Filled 2018-07-08 (×13): qty 1

## 2018-07-08 MED ORDER — INSULIN GLARGINE 100 UNIT/ML ~~LOC~~ SOLN
15.0000 [IU] | Freq: Every day | SUBCUTANEOUS | Status: DC
  Administered 2018-07-08 – 2018-07-13 (×6): 15 [IU] via SUBCUTANEOUS
  Filled 2018-07-08 (×7): qty 0.15

## 2018-07-08 NOTE — Progress Notes (Signed)
[facility]  ROUNDING NOTE   Subjective:   UOP 4343mL.  Creatinine 2.08 (2.68)  Furosemide 40mg  iv q12.   Dialysis catheter removed yesterday.   Sister at bedside.   Objective:  Vital signs in last 24 hours:  Temp:  [98.6 F (37 C)-99.7 F (37.6 C)] 98.7 F (37.1 C) (09/21 0830) Pulse Rate:  [73-108] 102 (09/21 0830) Resp:  [19-26] 26 (09/21 0830) BP: (163-189)/(61-71) 166/61 (09/21 0830) SpO2:  [93 %-98 %] 94 % (09/21 0830) Weight:  [734 kg] 124 kg (09/21 0419)  Weight change: -1.193 kg Filed Weights   07/06/18 0534 07/07/18 0500 07/08/18 0419  Weight: 123.4 kg 125.2 kg 124 kg    Intake/Output: I/O last 3 completed shifts: In: 14 [P.O.:720] Out: 8800 [Urine:8800]   Intake/Output this shift:  Total I/O In: -  Out: 950 [Urine:950]  Physical Exam: General: Critically ill  Head: Normocephalic, atraumatic. Moist oral mucosal membranes  Eyes: Anicteric, PERRL  Neck: Supple, trachea midline  Lungs:  Clear to auscultation, [place] 3 L   Heart: irregular  Abdomen:  Soft, nontender, obese  Extremities: trace peripheral edema.  Neurologic: Nonfocal, moving all four extremities  Skin: No lesions  Access: Left femoral temp HD catheter 1/93    Basic Metabolic Panel: Recent Labs  Lab 07/04/18 2325 07/05/18 0310 07/05/18 0422 07/05/18 0423 07/05/18 0904 07/05/18 1514 07/06/18 0434 07/07/18 0443 07/08/18 0413  NA 135 137  --  137 137 138 138 138 139  K >7.5* 7.3*  --  >7.5* 5.5* 4.2 4.7 4.3 4.3  CL 105 106  --  105 104 105 104 103 102  CO2 16* 15*  --  16* 23 25 25 26 27   GLUCOSE 239* 233*  --  223* 251* 170* 190* 253* 271*  BUN 80* 79*  --  82* 67* 52* 52* 50* 39*  CREATININE 5.26* 5.05*  --  5.14* 4.10* 3.15* 3.23* 2.68* 2.08*  CALCIUM 8.6* 8.8*  --  9.0 8.8* 8.5* 8.4* 8.3* 9.2  MG 2.4  --  2.9*  --  2.5* 2.2 2.3  --   --   PHOS  --  8.3*  --  8.2* 6.3* 4.7* 4.2  --   --     Liver Function Tests: Recent Labs  Lab 07/04/18 1908   07/05/18 0423 07/05/18 0904 07/05/18 1514 07/06/18 0434 07/06/18 1454  AST 85*  --   --   --   --   --  83*  ALT 61*  --   --   --   --   --  101*  ALKPHOS 78  --   --   --   --   --  78  BILITOT 0.4  --   --   --   --   --  0.6  PROT 7.0  --   --   --   --   --  5.7*  ALBUMIN 3.8   < > 3.6 3.5 3.4* 3.1* 3.2*   < > = values in this interval not displayed.   Recent Labs  Lab 07/04/18 1908  LIPASE 63*   Recent Labs  Lab 07/06/18 2000  AMMONIA 22    CBC: Recent Labs  Lab 07/04/18 2325 07/05/18 0422 07/06/18 0434 07/07/18 0443 07/08/18 0413  WBC 8.8 10.0 7.4 6.7 8.5  NEUTROABS  --   --  4.3  --   --   HGB 7.4* 7.3* 6.4* 8.3* 9.4*  HCT 23.0* 22.5* 19.1* 24.4* 27.9*  MCV 92.3 93.4 90.9 89.1 89.1  PLT 314 301 234 232 245    Cardiac Enzymes: Recent Labs  Lab 07/04/18 1908  TROPONINI <0.03    BNP: Invalid input(s): POCBNP  CBG: Recent Labs  Lab 07/07/18 0755 07/07/18 1226 07/07/18 1624 07/07/18 2039 07/08/18 0803  GLUCAP 257* 391* 333* 297* 331*    Microbiology: Results for orders placed or performed during the hospital encounter of 07/04/18  MRSA PCR Screening     Status: None   Collection Time: 07/04/18 10:10 PM  Result Value Ref Range Status   MRSA by PCR NEGATIVE NEGATIVE Final    Comment:        The GeneXpert MRSA Assay (FDA approved for NASAL specimens only), is one component of a comprehensive MRSA colonization surveillance program. It is not intended to diagnose MRSA infection nor to guide or monitor treatment for MRSA infections. Performed at [facility], [address] [place], [place] [address]   Culture, Urine     Status: Abnormal   Collection Time: 07/05/18  5:13 AM  Result Value Ref Range Status   Specimen Description   Final    URINE, RANDOM Performed at [facility], [address] [place], [place] [address]    Special Requests   Final    NONE Performed at [facility], [place], [place] [address]    Culture 30,000 COLONIES/mL ENTEROCOCCUS FAECALIS (A)  Final   Report Status 07/07/2018 FINAL  Final   Organism ID, Bacteria ENTEROCOCCUS FAECALIS (A)  Final      Susceptibility   Enterococcus faecalis - MIC*    AMPICILLIN <=2 SENSITIVE Sensitive     LEVOFLOXACIN >=8 RESISTANT Resistant     NITROFURANTOIN 64 INTERMEDIATE Intermediate     VANCOMYCIN 1 SENSITIVE Sensitive     * 30,000 COLONIES/mL ENTEROCOCCUS FAECALIS    Coagulation Studies: No results for input(s): LABPROT, INR in the last 72 hours.  Urinalysis: No results for input(s): COLORURINE, LABSPEC, PHURINE, GLUCOSEU, HGBUR, BILIRUBINUR, KETONESUR, PROTEINUR, UROBILINOGEN, NITRITE, LEUKOCYTESUR in the last 72 hours.  Invalid input(s): APPERANCEUR    Imaging: Dg Chest 1 View  Result Date: 07/07/2018 CLINICAL DATA:  Hypoxia, shortness of breath EXAM: CHEST  1 VIEW COMPARISON:  06/03/2018 FINDINGS: Cardiomegaly with vascular congestion. Worsening diffuse interstitial opacities, likely interstitial edema. Confluent opacity in the left lower lobe, atelectasis versus infiltrate. Suspect small effusions. No acute bony abnormality. IMPRESSION: Cardiomegaly with vascular congestion and worsening interstitial opacities, likely interstitial edema. Small bilateral effusions. Left lower lobe atelectasis or infiltrate. Electronically Signed   By: Rolm Baptise M.D.   On: 07/07/2018 10:37     Medications:   . sodium chloride     . acidophilus  1 capsule Oral QHS  . amoxicillin  500 mg Oral Q12H  . aspirin EC  81 mg Oral Daily  . citalopram  10 mg Oral Daily  . docusate sodium  100 mg Oral BID  . furosemide  40 mg Intravenous Q12H  . heparin injection (subcutaneous)  5,000 Units Subcutaneous Q8H  . hydrALAZINE  25 mg Oral Q8H  . insulin aspart  0-5 Units Subcutaneous QHS  . insulin aspart  0-9 Units Subcutaneous TID WC  . iron polysaccharides  150 mg Oral TID  . lamoTRIgine  25 mg Oral Daily  .  levETIRAcetam  250 mg Oral BID  . loratadine  10 mg Oral Daily  . mouth rinse  15 mL Mouth Rinse BID  . Melatonin  5  mg Oral QHS  . metoprolol tartrate  25 mg Oral BID  . OLANZapine zydis  10 mg Oral QHS  . pantoprazole  40 mg Oral BID  . polyethylene glycol  17 g Oral Daily  . senna  1 tablet Oral Daily  . sodium chloride flush  3 mL Intravenous Q12H   sodium chloride, acetaminophen **OR** acetaminophen, albuterol, bisacodyl, ondansetron **OR** ondansetron (ZOFRAN) IV, senna-docusate, sodium chloride flush  Assessment/ Plan:  Carolyn Valdez is a 77 y.o. white female with congestive heart failure, hypertension, diabetes mellitus type II, obstructive sleep apnea, nephrolithiasis who has been admitted to [facility] on 07/04/2018 for acute renal failure, hyperkalemia, metabolic acidosis requiring renal replacement therapy. Taking potassium chloride PO at home.   1. 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  Secondary to acute cardiorenal syndrome.   2. Hyperkalemia 3. Metabolic Acidosis 4. Acute exacerbation of diastolic congestive failure 5. Normocytic Anemia 6. Hyperphosphatemia 7. Diabetes mellitus type II with chronic kidney disease: hemoglobin A1c 7.6% on 04/22/18  Plan Emergently started on renal replacement therapy. CRRT on 9/19.   No indication for dialysis at this time. Nonoliguric urine output.  - Continue furosemide to 40mg  IV bid - Remove hemodialysis catheter.    LOS: 4 Neida Ellegood 9/21/201912:01 PM

## 2018-07-08 NOTE — Progress Notes (Signed)
Family Meeting Note  Advance Directive:yes  Today a meeting took place with the sister, neice.  Pt is alert and oriented.  The following clinical team members were present during this meeting:MD  The following were discussed:Patient's diagnosis: Chronic kidney disease, congestive heart failure, chronic anemia, recurrent pulmonary edema and fluid overload, functional decline, Patient's progosis: < 12 months and Goals for treatment: DNR  I had lengthy discussion with patient's sister and niece about her recurrent nature of conditions and no clear exit from the situation and it being terminal. They understands and confirmed that patient is not able to take care of herself at home and she need either long-term care placement or to be comfortable and hospice care.  They are leaning towards hospice level of care. After having detailed discussions, we had agreed that as this time patient is showing signs of some improvement, we would continue the treatment for the next 1 to 2 days and if she becomes stable enough to be discharged from hospital then we would send her to the nursing home with hospice care following over there.  If she does not improve enough in the next day or 2 then we might reevaluate the plan and make her comfort care in the hospital and let her go to hospice home.  Additional follow-up to be provided: palliative care  Time spent during discussion:30 minutes  Vaughan Basta, MD

## 2018-07-08 NOTE — Evaluation (Signed)
Physical Therapy Evaluation Patient Details Name: Carolyn Valdez MRN: [id number] DOB: Dec 14, 1940 Today's Date: 07/08/2018   History of Present Illness  77 yo Female admitted on 07/04/2018 with dx of hyperkalemia. she had syncope episode at home. She was found to have hypertension and tachycardia. Patient was admitted to to telemetry; PMH significant: Anxiety, CHF, DM, past RTKA, HTN, HLD, Recurrent UTI;   Clinical Impression  Pt laying in bed when PT entered the room. She is A&O x 4 and currently denies any pain. She requires mod assist with bed mobility, and min assist with sit-stand with RW. She was able to ambulate ~4 ft and sit in the recliner. She was able to perform exercises sitting in the recliner requiring tactile cues for proper height. She remained on 4 LPM throughout the session via nasal Cannula and remained 99%. She reported no pain or feeling out of breath/ dizziness throughout the session. She would benefit from physical therapy to promote bed mobility, sitting balance, LE strength and gait with LRAD in maximize function. Based on pt's limited mobility/ function and lives alone with limited caregiver support recommending she follow up with SNF following discharge.     Follow Up Recommendations SNF    Equipment Recommendations  Rolling walker with 5" wheels    Recommendations for Other Services       Precautions / Restrictions Precautions Precautions: None Restrictions Weight Bearing Restrictions: No      Mobility  Bed Mobility Overal bed mobility: Needs Assistance Bed Mobility: Supine to Sit     Supine to sit: Mod assist     General bed mobility comments: verbal cues for hand placement and min assist with leg movement toward EOB  Transfers Overall transfer level: Needs assistance Equipment used: Rolling walker (2 wheeled) Transfers: Sit to/from Stand Sit to Stand: Min assist         General transfer comment: pt was able to stand from EOB with bed elevated  using RW. required verbal cues for proper hand placement.  Ambulation/Gait Ambulation/Gait assistance: Min assist Gait Distance (Feet): 4 Feet Assistive device: Rolling walker (2 wheeled) Gait Pattern/deviations: Step-to pattern;Decreased stride length;Trendelenburg;Antalgic;Shuffle;Trunk flexed;Narrow base of support     General Gait Details: frequent cues required to avoid trunk flexed positioning to promote breathing  Stairs            Wheelchair Mobility    Modified Rankin (Stroke Patients Only)       Balance Overall balance assessment: Needs assistance Sitting-balance support: Bilateral upper extremity supported   Sitting balance - Comments: pt would hold onto bed railing with LUE and exhibit L trunk lean requiring frequent verbal cues to sit up straight which she could correct for short periods of time                                     Pertinent Vitals/Pain Pain Assessment: No/denies pain    Home Living Family/patient expects to be discharged to:: Private residence Living Arrangements: Alone Available Help at Discharge: Available PRN/intermittently;Family(sister is in a power w/c) Type of Home: House Home Access: Ramped entrance     Home Layout: One level Home Equipment: Walker - 2 wheels;Wheelchair - manual;Cane - single point;Shower seat;Grab bars - tub/shower      Prior Function Level of Independence: Independent with assistive device(s);Needs assistance   Gait / Transfers Assistance Needed: used RW for most ambulation  Hand Dominance   Dominant Hand: Right    Extremity/Trunk Assessment   Upper Extremity Assessment Upper Extremity Assessment: Overall WFL for tasks assessed    Lower Extremity Assessment Lower Extremity Assessment: RLE deficits/detail;LLE deficits/detail RLE Deficits / Details: overall 4-/5 (significant ankle LE edema noted ) LLE Deficits / Details: overall 4-/5 (significant ankle edema noted )        Communication   Communication: No difficulties  Cognition Arousal/Alertness: Awake/alert Behavior During Therapy: WFL for tasks assessed/performed                                          General Comments      Exercises Other Exercises Other Exercises: LAQ 2 x 10 (tactile cues for constant height), seated marching 2 x 10 bil, and ankle pumps   Assessment/Plan    PT Assessment Patient needs continued PT services  PT Problem List Decreased strength;Decreased range of motion;Decreased activity tolerance;Decreased balance;Decreased mobility;Decreased knowledge of use of DME;Decreased safety awareness       PT Treatment Interventions DME instruction;Gait training;Functional mobility training;Therapeutic exercise;Therapeutic activities;Balance training;Neuromuscular re-education    PT Goals (Current goals can be found in the Care Plan section)  Acute Rehab PT Goals Patient Stated Goal: to go home PT Goal Formulation: With patient Time For Goal Achievement: 07/22/18 Potential to Achieve Goals: Good    Frequency Min 2X/week   Barriers to discharge Decreased caregiver support      Co-evaluation               AM-PAC PT "6 Clicks" Daily Activity  Outcome Measure Difficulty turning over in bed (including adjusting bedclothes, sheets and blankets)?: A Lot Difficulty moving from lying on back to sitting on the side of the bed? : A Lot Difficulty sitting down on and standing up from a chair with arms (e.g., wheelchair, bedside commode, etc,.)?: A Lot Help needed moving to and from a bed to chair (including a wheelchair)?: Total Help needed walking in hospital room?: Total Help needed climbing 3-5 steps with a railing? : Total 6 Click Score: 9    End of Session Equipment Utilized During Treatment: Gait belt;Oxygen(o2 4LPM) Activity Tolerance: Patient tolerated treatment well;Patient limited by fatigue;Patient limited by lethargy Patient left: in  chair;with chair alarm set;with nursing/sitter in room;with call bell/phone within reach;with family/visitor present Nurse Communication: Mobility status PT Visit Diagnosis: Unsteadiness on feet (R26.81);Muscle weakness (generalized) (M62.81);Ataxic gait (R26.0);Other abnormalities of gait and mobility (R26.89)    Time: 9244-6286 PT Time Calculation (min) (ACUTE ONLY): 35 min   Charges:   PT Evaluation $PT Eval Moderate Complexity: 1 Mod PT Treatments $Therapeutic Exercise: 8-22 mins        Cameshia Cressman PT, DPT, LAT, ATC  07/08/18  12:03 PM       Izmael Duross 07/08/2018, 11:58 AM

## 2018-07-08 NOTE — Progress Notes (Signed)
Carolyn Valdez at [place] NAME: Carolyn Valdez    MR#:  [id number]  DATE OF BIRTH:  Dec 05, 1940  SUBJECTIVE:  CHIEF COMPLAINT:   Chief Complaint  Patient presents with  . Weakness   Came with feeling weak, noted bradycardia- High K. Had CRRT- K came down, HR stable now. Feels weak still. Still SOB, have good diuresis.  REVIEW OF SYSTEMS:    Review of Systems  Constitutional: Positive for malaise/fatigue. Negative for fever and weight loss.  HENT: Negative for congestion, ear discharge and sore throat.   Eyes: Negative for blurred vision and discharge.  Respiratory: Positive for shortness of breath. Negative for sputum production.   Cardiovascular: Positive for leg swelling. Negative for chest pain, palpitations and orthopnea.  Gastrointestinal: Negative for abdominal pain, diarrhea, nausea and vomiting.  Genitourinary: Negative for dysuria and frequency.  Musculoskeletal: Negative for joint pain and myalgias.  Skin: Negative for rash.  Neurological: Negative for dizziness, focal weakness, seizures and headaches.  Psychiatric/Behavioral: Negative for depression.    DRUG ALLERGIES:   Allergies  Allergen Reactions  . Sulfa Antibiotics Hives  . Biaxin [Clarithromycin] Hives  . Influenza A (H1n1) Monoval Vac Other (See Comments)    Pt states that she was told by her MD not to get the influenza vaccine.    . Morphine Other (See Comments)    Reaction:  Dizziness and confusion   . Pyridium [Phenazopyridine Hcl] Other (See Comments)    Reaction:  Unknown   . Ceftriaxone Anxiety  . Latex Rash  . Prednisone Rash  . Tape Rash    VITALS:  Blood pressure (!) 166/61, pulse (!) 102, temperature 98.7 F (37.1 C), temperature source Oral, resp. rate (!) 26, height 5\' 7"  (1.702 m), weight 124 kg, SpO2 94 %.  PHYSICAL EXAMINATION:  GENERAL:  77 y.o.-year-old patient lying in the bed with no acute distress.  EYES: Pupils equal, round, reactive to  light and accommodation. No scleral icterus. Extraocular muscles intact.  HEENT: Head atraumatic, normocephalic. Oropharynx and nasopharynx clear.  NECK:  Supple, no jugular venous distention. No thyroid enlargement, no tenderness.  LUNGS: Normal breath sounds bilaterally, no wheezing, some crepitation. No use of accessory muscles of respiration.  CARDIOVASCULAR: S1, S2 normal. No murmurs, rubs, or gallops.  ABDOMEN: Soft, nontender, nondistended. Bowel sounds present. No organomegaly or mass.  EXTREMITIES: b/l pedal edema,no cyanosis, or clubbing. NEUROLOGIC: Pt is alert and oriented today, follow simple command, have generalized weakness, sensations intact.  PSYCHIATRIC: The patient is alert and oriented x3.  SKIN: No obvious rash, lesion, or ulcer.   Physical Exam LABORATORY PANEL:   CBC Recent Labs  Lab 07/08/18 0413  WBC 8.5  HGB 9.4*  HCT 27.9*  PLT 245   ------------------------------------------------------------------------------------------------------------------  Chemistries  Recent Labs  Lab 07/06/18 0434 07/06/18 1454  07/08/18 0413  NA 138  --    < > 139  K 4.7  --    < > 4.3  CL 104  --    < > 102  CO2 25  --    < > 27  GLUCOSE 190*  --    < > 271*  BUN 52*  --    < > 39*  CREATININE 3.23*  --    < > 2.08*  CALCIUM 8.4*  --    < > 9.2  MG 2.3  --   --   --   AST  --  83*  --   --  ALT  --  101*  --   --   ALKPHOS  --  78  --   --   BILITOT  --  0.6  --   --    < > = values in this interval not displayed.   ------------------------------------------------------------------------------------------------------------------  Cardiac Enzymes Recent Labs  Lab 07/04/18 1908  TROPONINI <0.03   ------------------------------------------------------------------------------------------------------------------  RADIOLOGY:  Dg Chest 1 View  Result Date: 07/07/2018 CLINICAL DATA:  Hypoxia, shortness of breath EXAM: CHEST  1 VIEW COMPARISON:  06/03/2018  FINDINGS: Cardiomegaly with vascular congestion. Worsening diffuse interstitial opacities, likely interstitial edema. Confluent opacity in the left lower lobe, atelectasis versus infiltrate. Suspect small effusions. No acute bony abnormality. IMPRESSION: Cardiomegaly with vascular congestion and worsening interstitial opacities, likely interstitial edema. Small bilateral effusions. Left lower lobe atelectasis or infiltrate. Electronically Signed   By: Rolm Baptise M.D.   On: 07/07/2018 10:37    ASSESSMENT AND PLAN:   Active Problems:   Hyperkalemia  * Hyperkalemia with bradycardia. Monitored in stepdown unit.  treated with calcium gluconate, bicarb, NovoLog and D50.    CRRT started by Nephro- helped. Potassium level and renal function improved now.  Dialysis catheter removed.  * Acute renal failure on CKD. Due to cardiorenal syncrome   Due to dehydration Held Lasix on admission, improving after CRRT, cont to monitor. Improving.  *Altered mental status Likely due to UTI, also due to renal failure Vitals are stable and blood sugar is stable. I will check Keppra level. Started on treatment for UTI. Much improved, alert and oriented x3 today.  * Acute respiratory failure with hypoxia due to acute on chronic diastolic CHF ejection fraction 70%. Hold Lasix at this time due to renal failure. Cardiology consult.  Patient have increased pulmonary artery pressure per echocardiogram. Due to holding Lasix for last to 3 days, patient has worsening pulmonary edema. Resume IV Lasix to help with diuresis and continue to monitor renal function now.  *UTI Levaquin, follow urine culture. Culture grows enterococcus which is resistant to Levaquin, changed to amoxicillin per sensitivity.  * Hyponatremia.  Follow-up BMP.    Improved.  *Acute on chronic anemia due to chronic renal disease stage III We will check for stool guaiac.  Yesterday patient's hemoglobin had dropped less than 7 and she has  received transfusion now hemoglobin is stable.  All the records are reviewed and case discussed with Care Management/Social Workerr. Management plans discussed with the patient, family and they are in agreement.  CODE STATUS: Full.  TOTAL TIME TAKING CARE OF THIS PATIENT: 65 minutes.    POSSIBLE D/C IN 1-2 DAYS, DEPENDING ON CLINICAL CONDITION.   Vaughan Basta M.D on 07/08/2018   Between 7am to 6pm - Pager - [phone]  After 6pm go to [email or web address] - password [email or web address] [facility]  Office  [phone]  CC: Primary care physician; Leonel Ramsay, MD  Note: This dictation was prepared with Dragon dictation along with smaller phrase technology. Any transcriptional errors that result from this process are unintentional.

## 2018-07-09 LAB — BASIC METABOLIC PANEL
Anion gap: 5 (ref 5–15)
BUN: 44 mg/dL — AB (ref 8–23)
CO2: 30 mmol/L (ref 22–32)
CREATININE: 2.3 mg/dL — AB (ref 0.44–1.00)
Calcium: 8.8 mg/dL — ABNORMAL LOW (ref 8.9–10.3)
Chloride: 102 mmol/L (ref 98–111)
GFR calc Af Amer: 23 mL/min — ABNORMAL LOW (ref >60)
GFR calc non Af Amer: 20 mL/min — ABNORMAL LOW (ref >60)
GLUCOSE: 287 mg/dL — AB (ref 70–99)
Potassium: 4.5 mmol/L (ref 3.5–5.1)
SODIUM: 137 mmol/L (ref 135–145)

## 2018-07-09 LAB — GLUCOSE, CAPILLARY
GLUCOSE-CAPILLARY: 211 mg/dL — AB (ref 70–99)
Glucose-Capillary: 230 mg/dL — ABNORMAL HIGH (ref 70–99)
Glucose-Capillary: 312 mg/dL — ABNORMAL HIGH (ref 70–99)
Glucose-Capillary: 280 mg/dL — ABNORMAL HIGH (ref 70–99)

## 2018-07-09 MED ORDER — FUROSEMIDE 10 MG/ML IJ SOLN
20.0000 mg | Freq: Two times a day (BID) | INTRAMUSCULAR | Status: DC
  Administered 2018-07-09: 20 mg via INTRAVENOUS
  Filled 2018-07-09: qty 2

## 2018-07-09 NOTE — Progress Notes (Signed)
[facility]  ROUNDING NOTE   Subjective:   Sister and niece had family meeting yesterday. Code status updated.   Furosemide decreased to 20mg  IV bid.   UOP 1173mL.   3 L [place]  Objective:  Vital signs in last 24 hours:  Temp:  [98.2 F (36.8 C)-98.7 F (37.1 C)] 98.7 F (37.1 C) (09/22 0359) Pulse Rate:  [61-65] 61 (09/22 0512) Resp:  [17-18] 17 (09/22 0359) BP: (119-158)/(46-69) 138/60 (09/22 0512) SpO2:  [98 %-100 %] 98 % (09/22 0359) Weight:  [122.8 kg] 122.8 kg (09/22 0359)  Weight change: -1.2 kg Filed Weights   07/07/18 0500 07/08/18 0419 07/09/18 0359  Weight: 125.2 kg 124 kg 122.8 kg    Intake/Output: I/O last 3 completed shifts: In: -  Out: 3400 [Urine:3400]   Intake/Output this shift:  Total I/O In: -  Out: 1100 [Urine:1100]  Physical Exam: General: Critically ill  Head: Normocephalic, atraumatic. Moist oral mucosal membranes  Eyes: Anicteric, PERRL  Neck: Supple, trachea midline  Lungs:  Clear to auscultation, [place] 3 L   Heart: irregular  Abdomen:  Soft, nontender, obese  Extremities: trace peripheral edema.  Neurologic: Nonfocal, moving all four extremities  Skin: No lesions        Basic Metabolic Panel: Recent Labs  Lab 07/04/18 2325 07/05/18 0310 07/05/18 0422 07/05/18 0423 07/05/18 3716 07/05/18 1514 07/06/18 0434 07/07/18 0443 07/08/18 0413 07/09/18 0354  NA 135 137  --  137 137 138 138 138 139 137  K >7.5* 7.3*  --  >7.5* 5.5* 4.2 4.7 4.3 4.3 4.5  CL 105 106  --  105 104 105 104 103 102 102  CO2 16* 15*  --  16* 23 25 25 26 27 30   GLUCOSE 239* 233*  --  223* 251* 170* 190* 253* 271* 287*  BUN 80* 79*  --  82* 67* 52* 52* 50* 39* 44*  CREATININE 5.26* 5.05*  --  5.14* 4.10* 3.15* 3.23* 2.68* 2.08* 2.30*  CALCIUM 8.6* 8.8*  --  9.0 8.8* 8.5* 8.4* 8.3* 9.2 8.8*  MG 2.4  --  2.9*  --  2.5* 2.2 2.3  --   --   --   PHOS  --  8.3*  --  8.2* 6.3* 4.7* 4.2  --   --   --     Liver Function Tests: Recent Labs  Lab  07/04/18 1908  07/05/18 0423 07/05/18 0904 07/05/18 1514 07/06/18 0434 07/06/18 1454  AST 85*  --   --   --   --   --  83*  ALT 61*  --   --   --   --   --  101*  ALKPHOS 78  --   --   --   --   --  78  BILITOT 0.4  --   --   --   --   --  0.6  PROT 7.0  --   --   --   --   --  5.7*  ALBUMIN 3.8   < > 3.6 3.5 3.4* 3.1* 3.2*   < > = values in this interval not displayed.   Recent Labs  Lab 07/04/18 1908  LIPASE 63*   Recent Labs  Lab 07/06/18 2000  AMMONIA 22    CBC: Recent Labs  Lab 07/04/18 2325 07/05/18 0422 07/06/18 0434 07/07/18 0443 07/08/18 0413  WBC 8.8 10.0 7.4 6.7 8.5  NEUTROABS  --   --  4.3  --   --  HGB 7.4* 7.3* 6.4* 8.3* 9.4*  HCT 23.0* 22.5* 19.1* 24.4* 27.9*  MCV 92.3 93.4 90.9 89.1 89.1  PLT 314 301 234 232 245    Cardiac Enzymes: Recent Labs  Lab 07/04/18 1908  TROPONINI <0.03    BNP: Invalid input(s): POCBNP  CBG: Recent Labs  Lab 07/08/18 0803 07/08/18 1224 07/08/18 1706 07/08/18 2012 07/09/18 0740  GLUCAP 331* 323* 294* 26* 36*    Microbiology: Results for orders placed or performed during the hospital encounter of 07/04/18  MRSA PCR Screening     Status: None   Collection Time: 07/04/18 10:10 PM  Result Value Ref Range Status   MRSA by PCR NEGATIVE NEGATIVE Final    Comment:        The GeneXpert MRSA Assay (FDA approved for NASAL specimens only), is one component of a comprehensive MRSA colonization surveillance program. It is not intended to diagnose MRSA infection nor to guide or monitor treatment for MRSA infections. Performed at [facility], [address] [place], [place] [address]   Culture, Urine     Status: Abnormal   Collection Time: 07/05/18  5:13 AM  Result Value Ref Range Status   Specimen Description   Final    URINE, RANDOM Performed at [facility], [address] [place], [place] [address]    Special Requests   Final    NONE Performed at [facility], [place], [place] [address]    Culture 30,000 COLONIES/mL ENTEROCOCCUS FAECALIS (A)  Final   Report Status 07/07/2018 FINAL  Final   Organism ID, Bacteria ENTEROCOCCUS FAECALIS (A)  Final      Susceptibility   Enterococcus faecalis - MIC*    AMPICILLIN <=2 SENSITIVE Sensitive     LEVOFLOXACIN >=8 RESISTANT Resistant     NITROFURANTOIN 64 INTERMEDIATE Intermediate     VANCOMYCIN 1 SENSITIVE Sensitive     * 30,000 COLONIES/mL ENTEROCOCCUS FAECALIS    Coagulation Studies: No results for input(s): LABPROT, INR in the last 72 hours.  Urinalysis: No results for input(s): COLORURINE, LABSPEC, PHURINE, GLUCOSEU, HGBUR, BILIRUBINUR, KETONESUR, PROTEINUR, UROBILINOGEN, NITRITE, LEUKOCYTESUR in the last 72 hours.  Invalid input(s): APPERANCEUR    Imaging: Dg Chest 1 View  Result Date: 07/07/2018 CLINICAL DATA:  Hypoxia, shortness of breath EXAM: CHEST  1 VIEW COMPARISON:  06/03/2018 FINDINGS: Cardiomegaly with vascular congestion. Worsening diffuse interstitial opacities, likely interstitial edema. Confluent opacity in the left lower lobe, atelectasis versus infiltrate. Suspect small effusions. No acute bony abnormality. IMPRESSION: Cardiomegaly with vascular congestion and worsening interstitial opacities, likely interstitial edema. Small bilateral effusions. Left lower lobe atelectasis or infiltrate. Electronically Signed   By: Rolm Baptise M.D.   On: 07/07/2018 10:37     Medications:   . sodium chloride     . acidophilus  1 capsule Oral QHS  . amoxicillin  500 mg Oral Q12H  . aspirin EC  81 mg Oral Daily  . citalopram  10 mg Oral Daily  . docusate sodium  100 mg Oral BID  . furosemide  20 mg Intravenous Q12H  . heparin injection (subcutaneous)  5,000 Units Subcutaneous Q8H  . hydrALAZINE  25 mg Oral Q8H  . insulin aspart  0-5 Units Subcutaneous QHS  . insulin aspart  0-9 Units Subcutaneous TID WC  . insulin glargine  15 Units Subcutaneous QHS  . iron polysaccharides   150 mg Oral TID  . lamoTRIgine  25 mg Oral Daily  . levETIRAcetam  250 mg Oral BID  .  loratadine  10 mg Oral Daily  . mouth rinse  15 mL Mouth Rinse BID  . Melatonin  5 mg Oral QHS  . metoprolol tartrate  25 mg Oral BID  . OLANZapine zydis  10 mg Oral QHS  . pantoprazole  40 mg Oral BID  . polyethylene glycol  17 g Oral Daily  . senna  1 tablet Oral Daily  . sodium chloride flush  3 mL Intravenous Q12H   sodium chloride, acetaminophen **OR** acetaminophen, albuterol, bisacodyl, ondansetron **OR** ondansetron (ZOFRAN) IV, senna-docusate, sodium chloride flush  Assessment/ Plan:  [name] is a 77 y.o. white female with congestive heart failure, hypertension, diabetes mellitus type II, obstructive sleep apnea, nephrolithiasis who has been admitted to [facility] on 07/04/2018 for acute renal failure, hyperkalemia, metabolic acidosis requiring renal replacement therapy. Taking potassium chloride PO at home.   1. Acute Renal Failure with hyperkalemia and metabolic acidosis: on chronic kidney disease stage III with proteinuria baseline creatinine of 1.09, GFR of 49 on 02/03/18.  Secondary to acute cardiorenal syndrome.  Emergently started on renal replacement therapy. CRRT on 9/19.   Nonoliguric urine output. No indication for dialysis at this time. Dialysis catheter removed.   2. Acute exacerbation of diastolic congestive failure - Continue IV furosemide - Fluid restriction   LOS: 5 Ranjit Ashurst 9/22/201910:34 AM

## 2018-07-09 NOTE — Progress Notes (Signed)
[facility] [place] NAME: Carolyn Valdez    MR#:  [id number]  DATE OF BIRTH:  1940-11-12  SUBJECTIVE:  CHIEF COMPLAINT:   Chief Complaint  Patient presents with  . Weakness   Came with feeling weak, noted bradycardia- High K. Had CRRT- K came down, HR stable now. Feels weak still. Still SOB, have good diuresis.  REVIEW OF SYSTEMS:    Review of Systems  Constitutional: Positive for malaise/fatigue. Negative for fever and weight loss.  HENT: Negative for congestion, ear discharge and sore throat.   Eyes: Negative for blurred vision and discharge.  Respiratory: Positive for shortness of breath. Negative for sputum production.   Cardiovascular: Positive for leg swelling. Negative for chest pain, palpitations and orthopnea.  Gastrointestinal: Negative for abdominal pain, diarrhea, nausea and vomiting.  Genitourinary: Negative for dysuria and frequency.  Musculoskeletal: Negative for joint pain and myalgias.  Skin: Negative for rash.  Neurological: Negative for dizziness, focal weakness, seizures and headaches.  Psychiatric/Behavioral: Negative for depression.    DRUG ALLERGIES:   Allergies  Allergen Reactions  . Sulfa Antibiotics Hives  . Biaxin [Clarithromycin] Hives  . Influenza A (H1n1) Monoval Vac Other (See Comments)    Pt states that she was told by her MD not to get the influenza vaccine.    . Morphine Other (See Comments)    Reaction:  Dizziness and confusion   . Pyridium [Phenazopyridine Hcl] Other (See Comments)    Reaction:  Unknown   . Ceftriaxone Anxiety  . Latex Rash  . Prednisone Rash  . Tape Rash    VITALS:  Blood pressure (!) 151/59, pulse 60, temperature 98 F (36.7 C), temperature source Oral, resp. rate 18, height 5\' 7"  (1.702 m), weight 122.8 kg, SpO2 99 %.  PHYSICAL EXAMINATION:  GENERAL:  77 y.o.-year-old patient lying in the bed with no acute distress.  EYES: Pupils equal, round, reactive to light and  accommodation. No scleral icterus. Extraocular muscles intact.  HEENT: Head atraumatic, normocephalic. Oropharynx and nasopharynx clear.  NECK:  Supple, no jugular venous distention. No thyroid enlargement, no tenderness.  LUNGS: Normal breath sounds bilaterally, no wheezing, some crepitation. No use of accessory muscles of respiration.  CARDIOVASCULAR: S1, S2 normal. No murmurs, rubs, or gallops.  ABDOMEN: Soft, nontender, nondistended. Bowel sounds present. No organomegaly or mass.  EXTREMITIES: b/l pedal edema,no cyanosis, or clubbing. NEUROLOGIC: Pt is alert and oriented today, follow simple command, have generalized weakness, sensations intact.  PSYCHIATRIC: The patient is alert and oriented x3.  SKIN: No obvious rash, lesion, or ulcer.   Physical Exam LABORATORY PANEL:   CBC Recent Labs  Lab 07/08/18 0413  WBC 8.5  HGB 9.4*  HCT 27.9*  PLT 245   ------------------------------------------------------------------------------------------------------------------  Chemistries  Recent Labs  Lab 07/06/18 0434 07/06/18 1454  07/09/18 0354  NA 138  --    < > 137  K 4.7  --    < > 4.5  CL 104  --    < > 102  CO2 25  --    < > 30  GLUCOSE 190*  --    < > 287*  BUN 52*  --    < > 44*  CREATININE 3.23*  --    < > 2.30*  CALCIUM 8.4*  --    < > 8.8*  MG 2.3  --   --   --   AST  --  83*  --   --  ALT  --  101*  --   --   ALKPHOS  --  78  --   --   BILITOT  --  0.6  --   --    < > = values in this interval not displayed.   ------------------------------------------------------------------------------------------------------------------  Cardiac Enzymes Recent Labs  Lab 07/04/18 1908  TROPONINI <0.03   ------------------------------------------------------------------------------------------------------------------  RADIOLOGY:  No results found.  ASSESSMENT AND PLAN:   Active Problems:   Hyperkalemia  * Hyperkalemia with bradycardia. Monitored in stepdown unit.   treated with calcium gluconate, bicarb, NovoLog and D50.    CRRT started by Nephro- helped. Potassium level and renal function improved now.  Dialysis catheter removed.  * Acute renal failure on CKD. Due to cardiorenal syncrome   Due to dehydration Held Lasix on admission, improving after CRRT, cont to monitor. Improving.  * Altered mental status Likely due to UTI, also due to renal failure Vitals are stable and blood sugar is stable. I will check Keppra level. Started on treatment for UTI. Much improved, alert and oriented x3 today.  * Acute respiratory failure with hypoxia due to acute on chronic diastolic CHF ejection fraction 70%. Hold Lasix at this time due to renal failure. Cardiology consult.  Patient have increased pulmonary artery pressure per echocardiogram. Due to holding Lasix on admission, patient has worsening pulmonary edema. Resume IV Lasix to help with diuresis and continue to monitor renal function now.  * UTI Levaquin, follow urine culture. Culture grows enterococcus which is resistant to Levaquin, changed to amoxicillin per sensitivity.  * Hyponatremia.  Follow-up BMP.    Improved.  * Acute on chronic anemia due to chronic renal disease stage III We will check for stool guaiac.  Yesterday patient's hemoglobin had dropped less than 7 and she has received transfusion now hemoglobin is stable.  All the records are reviewed and case discussed with Care Management/Social Workerr. Management plans discussed with the patient, family and they are in agreement.  CODE STATUS: DNR.  TOTAL TIME TAKING CARE OF THIS PATIENT: 35 minutes.    POSSIBLE D/C IN 1-2 DAYS, DEPENDING ON CLINICAL CONDITION.   Vaughan Basta M.D on 07/09/2018   Between 7am to 6pm - Pager - [phone]  After 6pm go to [email or web address] - password [email or web address] [facility]  Office  [phone]  CC: Primary care physician; Leonel Ramsay, MD  Note: This  dictation was prepared with Dragon dictation along with smaller phrase technology. Any transcriptional errors that result from this process are unintentional.

## 2018-07-10 DIAGNOSIS — R0902 Hypoxemia: Secondary | ICD-10-CM

## 2018-07-10 DIAGNOSIS — Z66 Do not resuscitate: Secondary | ICD-10-CM

## 2018-07-10 LAB — BASIC METABOLIC PANEL
Anion gap: 4 — ABNORMAL LOW (ref 5–15)
BUN: 45 mg/dL — AB (ref 8–23)
CALCIUM: 8.6 mg/dL — AB (ref 8.9–10.3)
CO2: 31 mmol/L (ref 22–32)
CREATININE: 2.22 mg/dL — AB (ref 0.44–1.00)
Chloride: 101 mmol/L (ref 98–111)
GFR calc Af Amer: 24 mL/min — ABNORMAL LOW (ref >60)
GFR calc non Af Amer: 20 mL/min — ABNORMAL LOW (ref >60)
GLUCOSE: 209 mg/dL — AB (ref 70–99)
Potassium: 4.3 mmol/L (ref 3.5–5.1)
Sodium: 136 mmol/L (ref 135–145)

## 2018-07-10 LAB — CBC
HCT: 24.7 % — ABNORMAL LOW (ref 35.0–47.0)
HEMOGLOBIN: 8.3 g/dL — AB (ref 12.0–16.0)
MCH: 30 pg (ref 26.0–34.0)
MCHC: 33.4 g/dL (ref 32.0–36.0)
MCV: 89.7 fL (ref 80.0–100.0)
PLATELETS: 222 10*3/uL (ref 150–440)
RBC: 2.75 MIL/uL — ABNORMAL LOW (ref 3.80–5.20)
RDW: 18.9 % — AB (ref 11.5–14.5)
WBC: 6 10*3/uL (ref 3.6–11.0)

## 2018-07-10 LAB — GLUCOSE, CAPILLARY
GLUCOSE-CAPILLARY: 180 mg/dL — AB (ref 70–99)
GLUCOSE-CAPILLARY: 263 mg/dL — AB (ref 70–99)
GLUCOSE-CAPILLARY: 310 mg/dL — AB (ref 70–99)
Glucose-Capillary: 194 mg/dL — ABNORMAL HIGH (ref 70–99)

## 2018-07-10 MED ORDER — TORSEMIDE 20 MG PO TABS
40.0000 mg | ORAL_TABLET | Freq: Every day | ORAL | Status: DC
  Administered 2018-07-10 – 2018-07-14 (×5): 40 mg via ORAL
  Filled 2018-07-10 (×5): qty 2

## 2018-07-10 NOTE — Care Management (Signed)
Physical therapy has made recommendation for skilled nursing facility but patient does not / is not capable of participating.  She has had hired through in home caregivers in the past through [facility]. She has had [facility] in the past.  Patient wishes to return home.  There is nothing present that indicates that patient is not capable of making her own decisions, even if it is not the safest. Palliative will meet with patient's sister Carolyn Valdez to discuss disposition

## 2018-07-10 NOTE — Progress Notes (Signed)
PALLIATIVE NOTE:  Spoke with sister [name] in great detail today. She has contacted me back and would like to meet in person along with Ms. Goodwyn and have further discussions. She feels her sister is upset with her about making decisions without including her. She recognizes she was unable to have discussions previously however, patient is a little more alert and oriented today and states she should be involved. She is however, concerned that patient is persistent about returning home but she feels this is not the safest option for her. Support was given. [name] states she will call me first thing tomorrow morning with a designated meeting time, she has to arrange for someone to care for her disabled husband so that she can come up and meet. She has my contact information and also informed if she has a time later today she can contact RN and provide time and RN can page or call and notify me of the requested meeting time.   [name] verbalized understanding and appreciation.   Alda Lea, AGPCNP-BC Palliative Medicine Team  Cell:[phone] Phone: [phone] Fax: [phone] Pager: [phone] Amion: Delane Ginger. [place]

## 2018-07-10 NOTE — Progress Notes (Signed)
Inpatient Diabetes Program Recommendations  AACE/ADA: New Consensus Statement on Inpatient Glycemic Control (2019)  Target Ranges:  Prepandial:   less than 140 mg/dL      Peak postprandial:   less than 180 mg/dL (1-2 hours)      Critically ill patients:  140 - 180 mg/dL  Results for DAMICA, GRAVLIN (MRN [id number]) as of 07/10/2018 10:33  Ref. Range 07/09/2018 07:40 07/09/2018 11:48 07/09/2018 16:57 07/09/2018 19:57 07/10/2018 08:20  Glucose-Capillary Latest Ref Range: 70 - 99 mg/dL 230 (H) 211 (H) 312 (H) 280 (H) 180 (H)    Review of Glycemic Control Diabetes history:DM2 Outpatient Diabetes medications:Lantus30-40units QHS when needed (if glucose is less than 100 mg/dl does not take), Metformin 1000 mg BID, Novolog listed onhome med listbut patient reports she is NOT taking Novolog Current orders for Inpatient glycemic control:Lantus 15 units QHS, Novolog 0-9 units TID with meals, Novolog 0-5 units QHS  Inpatient Diabetes Program Recommendations:  Insulin - Basal: Please consider increasing Lantus to 17 units QHS. Insulin-Meal Coverage: If patient is eating well, please consider ordering Novolog 4 units TID with meals for meal coverage if patient eats at least 50% of meals.  Thanks, Barnie Alderman, RN, MSN, CDE Diabetes Coordinator Inpatient Diabetes Program [phone] (Team Pager from 8am to 5pm)

## 2018-07-10 NOTE — Progress Notes (Signed)
Daily Progress Note   Patient Name: Carolyn Valdez       Date: 07/10/2018 DOB: May 24, 1941  Age: 76 y.o. MRN#: [id number] Attending Physician: Carolyn Valdez, * Primary Care Physician: Carolyn Ramsay, MD Admit Date: 07/04/2018  Reason for Consultation/Follow-up: Establishing goals of care  Subjective:  Patient sitting up in bed watching tv. She is A&O x3. Denies pain/discomfort. Denies shortness of breath. Patient expressed concerns that her sister had been making medical decisions. She stated "I am competent and alert. Any decisions needed to be made should be discussed with me and I don't mind my sister being involved but ultimately I know what I want and as long as I am able to express them that is what should happen!" Support was given. Patient is A&O x 3 during my visit. She is able to express her name, dob, location, why she was in the hospital. She verbalized remembering our conversation on last week, and that she has been having complications with her kidneys and heart.   I discussed with patient her current illness and co-morbidities. We also discussed her periods of lethargy and confusion. Patient verbalized understanding. We discussed hospice and palliative care outpatient services as well as SNF for rehab which has been recommended by PT. Patient seems to feel she is able to go home with assistance. She verbalizes she wants to go home and already has someone who comes in daily to assist with errands, housework, and ADLs. She does endorse being left alone for at least 4 hrs as her friend has other homes to care for also. I expressed concerns for her returning home without 24/7 care, being deconditioned, her illnesses and trajectory, and change in status since admission. Patient  expressed understanding. She continued to state her wish is to return home but at the same time she wasn't sure what to do. I offered to allow her to allow me to discuss further with her sister and patient agreed.   I spoke in detail with patient's sister, Carolyn Valdez. Carolyn Valdez also expressed concerns that she had spoken to her sister who seemed to be upset with her in regards to health care decisions. Carolyn Valdez states that she feels that her sister is unsafe to return home by herself and worries that she would not receive the care needed in her current state.  She also expresses that she cares for her ill husband and could not care for her. Carolyn Valdez reports that [name] does have a friend who comes daily however she is by herself at least 5-6 hrs/day and during the night. Sister reports her goal would be her to go to SNF for rehab and have hospice services, with hopes of transitioning maybe to long-term care or to see how she tolerates therapy and go from there. Sister educated that patient could not have hospice services while at [facility] for rehab but could have Palliative services instead, and later transition to hospice if this becomes the final plan. Sister verbalizes understanding and feels hospice may be most appropriate however, she is still concerned with home care 24/7. Requested to have a family meeting with sister and patient to discuss an formulate a more definitive discharge plan. Sister verbalized understanding and feels this would be best. She states she has an appointment tomorrow morning at 9am and could meet afterwards depending on her husband willingness. She would like to discuss with her sister and her husband and will get back in contact with me to schedule.   Chart Reviewed.   Length of Stay: 6  Current Medications: Scheduled Meds:  . acidophilus  1 capsule Oral QHS  . amoxicillin  500 mg Oral Q12H  . aspirin EC  81 mg Oral Daily  . citalopram  10 mg Oral Daily  . docusate sodium   100 mg Oral BID  . heparin injection (subcutaneous)  5,000 Units Subcutaneous Q8H  . hydrALAZINE  25 mg Oral Q8H  . insulin aspart  0-5 Units Subcutaneous QHS  . insulin aspart  0-9 Units Subcutaneous TID WC  . insulin glargine  15 Units Subcutaneous QHS  . iron polysaccharides  150 mg Oral TID  . lamoTRIgine  25 mg Oral Daily  . levETIRAcetam  250 mg Oral BID  . loratadine  10 mg Oral Daily  . mouth rinse  15 mL Mouth Rinse BID  . Melatonin  5 mg Oral QHS  . metoprolol tartrate  25 mg Oral BID  . OLANZapine zydis  10 mg Oral QHS  . pantoprazole  40 mg Oral BID  . polyethylene glycol  17 g Oral Daily  . senna  1 tablet Oral Daily  . sodium chloride flush  3 mL Intravenous Q12H  . torsemide  40 mg Oral Daily    Continuous Infusions: . sodium chloride      PRN Meds: sodium chloride, acetaminophen **OR** acetaminophen, albuterol, bisacodyl, ondansetron **OR** ondansetron (ZOFRAN) IV, senna-docusate, sodium chloride flush  Physical Exam  Constitutional: She is oriented to person, place, and time. Vital signs are normal. She is cooperative.  Cardiovascular: Normal rate, regular rhythm, normal heart sounds and normal pulses.  Bilateral pedal edema   Pulmonary/Chest: Effort normal. She has decreased breath sounds.  Musculoskeletal:  Generalized weakness   Neurological: She is alert and oriented to person, place, and time.  Skin: Skin is warm and dry.  Nursing note and vitals reviewed.          Vital Signs: BP (!) 160/59 (BP Location: Left Arm)   Pulse 84   Temp (!) 97.5 F (36.4 C) (Oral)   Resp 18   Ht 5\' 7"  (1.702 m)   Wt 120.8 kg   SpO2 98%   BMI 41.71 kg/m  SpO2: SpO2: 98 % O2 Device: O2 Device: Nasal Cannula O2 Flow Rate: O2 Flow Rate (L/min): 3 L/min  Intake/output summary:  Intake/Output Summary (Last 24 hours) at 07/10/2018 1531 Last data filed at 07/10/2018 1412 Gross per 24 hour  Intake 240 ml  Output 2901 ml  Net -2661 ml   LBM: Last BM Date:  07/10/18 Baseline Weight: Weight: 121.9 kg Most recent weight: Weight: 120.8 kg      Palliative Assessment/Data:PPS 30%   Flowsheet Rows     Most Recent Value  Intake Tab  Referral Department  Hospitalist  Unit at Time of Referral  Med/Surg Unit  Palliative Care Primary Diagnosis  Cardiac  Date Notified  07/06/18  Palliative Care Type  Return patient Palliative Care  Reason for referral  Clarify Goals of Care  Date of Admission  07/04/18  Date first seen by Palliative Care  07/06/18  # of days Palliative referral response time  0 Day(s)  # of days IP prior to Palliative referral  2  Clinical Assessment  Psychosocial & Spiritual Assessment  Palliative Care Outcomes     Patient Active Problem List   Diagnosis Date Noted  . Hyperkalemia 07/04/2018  . Atrial fibrillation with RVR ([place]) 04/23/2018  . Syncope 04/22/2018  . Acute respiratory failure with hypoxia and hypercapnia ([place]) 02/21/2017  . Adjustment disorder with anxiety 01/28/2017  . Acute on chronic respiratory failure with hypoxia ([place]) 01/23/2017  . Palliative care encounter   . Goals of care, counseling/discussion   . DNR (do not resuscitate) 10/05/2016  . Palliative care by specialist 10/05/2016  . Depression, major, recurrent, severe with psychosis ([place]) 10/05/2016  . Severe major depression, single episode, with psychotic features ([place]) 10/04/2016  . Cellulitis of leg, right 09/29/2016  . Proctitis 09/29/2016  . Hypercapnia 09/29/2016  . Acute urinary retention 09/29/2016  . UTI (urinary tract infection) 09/29/2016  . Weakness 09/29/2016  . Subacute delirium 09/28/2016  . Gastrointestinal hemorrhage   . Diffuse abdominal pain   . Altered mental status 09/24/2016  . Pressure injury of skin 09/02/2016  . Respiratory failure with hypoxia ([place]) 09/01/2016  . Lymphedema 08/16/2016  . Acute on chronic congestive heart failure ([place]) 08/09/2016  . Acquired lymphedema of leg 04/21/2016  . Congestive heart  failure ([place]) 11/25/2015  . Nocturia 11/05/2015  . Urinary frequency 11/05/2015  . Acute respiratory failure with hypoxia ([place]) 09/05/2015  . 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 Incontinence 04/20/2015  . Diabetes mellitus, type 2 ([place]) 04/17/2015  . ESBL (extended spectrum beta-lactamase) producing bacteria infection 04/17/2015  . BP (high blood pressure) 04/17/2015  . Frequent UTI 04/17/2015  . Absence of bladder continence 04/17/2015  . Iron deficiency anemia 03/08/2015  . Absolute anemia 09/25/2014  . Abdominal pain, lower 09/25/2014  . Urge incontinence 02/19/2013  . FOM (frequency of micturition) 02/19/2013  . Bladder infection, chronic 08/11/2012  . Difficult or painful urination 07/26/2012  . Lower urinary tract infection 12/31/2011  . Diabetes mellitus ([place]) 12/31/2011    Palliative Care Assessment & Plan   Patient Profile: 77 y.o. female admitted on 07/04/2018 from home with complaints of weakness and leg swelling.  Patient has a past medical history significant for diabetes, hyperlipidemia, hypertension, CHF, breast cancer (right breast mastectomy 1997), urinary incontinence, SVT, obesity, right total knee replacement, and anxiety.  During her ED course patient complained of shortness of breath, feeling weak for several days prior to admission, and bilateral leg edema.  She was found to be hypoxic and placed on nasal cannula.  Patient was also found to be bradycardic with heart rates in the 30s/40s.  Her potassium was 7.3.  CO2  13.  BUN 75.  Creatinine 5.11.  Lipase 63.  BNP 3147.  Hemoglobin 8.0.  Troponin <0.03.  Patient was treated with calcium gluconate, bicarb, NovoLog, and D50.  Since admission she has been followed by cardiology and nephrology.  Patient also received CRRT.  Palliative medicine team consulted for goals of care discussion.  Recommendations/Plan:  DNR/DNI-as documented, also discussed with patient who verbalized her agreement  Continue to treat the treatable.    Will wait to hear back from sister, Carolyn Valdez [facility]) to arrange for follow-up goals of care discussion.    Goals of Care and Additional Recommendations:  Limitations on Scope of Treatment: Full Scope Treatment  Code Status:    Code Status Orders  (From admission, onward)         Start     Ordered   07/06/18 1407  Do not attempt resuscitation (DNR)  Continuous    Question Answer Comment  In the event of cardiac or respiratory ARREST Do not call a "code blue"   In the event of cardiac or respiratory ARREST Do not perform Intubation, CPR, defibrillation or ACLS   In the event of cardiac or respiratory ARREST Use medication by any route, position, wound care, and other measures to relive pain and suffering. May use oxygen, suction and manual treatment of airway obstruction as needed for comfort.   Comments discussed with POA      07/06/18 1407        Code Status History    Date Active Date Inactive Code Status Order ID Comments User Context   07/04/2018 2200 07/06/2018 1407 Full Code [id number]  Demetrios Loll, MD Inpatient   04/22/2018 1702 04/26/2018 1851 DNR [id number]  Max Sane, MD Inpatient   02/21/2017 1629 02/24/2017 1747 DNR [id number]  Fritzi Mandes, MD Inpatient   01/31/2017 1617 02/04/2017 1350 DNR [id number]  Max Sane, MD Inpatient   01/23/2017 1320 01/31/2017 1617 Full Code [id number]  Henreitta Leber, MD Inpatient   10/05/2016 1216 10/26/2016 1740 DNR [id number]  Knox Royalty, NP Inpatient   09/24/2016 1731 10/05/2016 1216 Full Code [id number]  Gladstone Lighter, MD Inpatient   09/01/2016 0217 09/05/2016 1628 Full Code [id number]  Hugelmeyer, [place], DO Inpatient   08/09/2016 2027 08/12/2016 2015 Full Code [id number]  Henreitta Leber, MD Inpatient   09/30/2015 1423 10/07/2015 1737 Full Code [id number]  Loletha Grayer, MD ED   09/05/2015 1756 09/09/2015 1738 DNR [id number]  Loletha Grayer, MD ED       Prognosis:   Unable to determine guarded to poor in the setting of  hyperkalemia, hypertension, bradycardia, additionally, diabetes, CHF, dehydration, CKD , acute respiratory failure with hypoxia, and hyponatremia.  Discharge Planning:  To Be Determined  Care plan was discussed with patient, patient's sister, and bedside RN.   Thank you for allowing the Palliative Medicine Team to assist in the care of this patient.   Total Time 45 min.  Prolonged Time Billed NO       Greater than 50%  of this time was spent counseling and coordinating care related to the above assessment and plan.  Alda Lea, AGPCNP-BC Palliative Medicine Team  Pager: [phone] Amion: Bjorn Pippin   Please contact Palliative Medicine Team phone at [phone] for questions and concerns.

## 2018-07-10 NOTE — Care Management Important Message (Signed)
Important Message  Patient Details  Name: Carolyn Valdez MRN: [id number] Date of Birth: May 02, 1941   Medicare Important Message Given:  Yes    [name] A Larissa Pegg 07/10/2018, 11:34 AM

## 2018-07-10 NOTE — Progress Notes (Signed)
[place] [facility] [place] NAME: Carolyn Valdez    MR#:  [id number]  DATE OF BIRTH:  01-Jul-1941  SUBJECTIVE:  CHIEF COMPLAINT:   Chief Complaint  Patient presents with  . Weakness   Came with feeling weak, noted bradycardia- High K. Had CRRT- K came down, HR stable now. Feels weak still. Still SOB, have good diuresis.  She is lethargic today. Arousable, but goes back to sleep soon.  REVIEW OF SYSTEMS:    Review of Systems  Constitutional: Positive for malaise/fatigue. Negative for fever and weight loss.  HENT: Negative for congestion, ear discharge and sore throat.   Eyes: Negative for blurred vision and discharge.  Respiratory: Positive for shortness of breath. Negative for sputum production.   Cardiovascular: Positive for leg swelling. Negative for chest pain, palpitations and orthopnea.  Gastrointestinal: Negative for abdominal pain, diarrhea, nausea and vomiting.  Genitourinary: Negative for dysuria and frequency.  Musculoskeletal: Negative for joint pain and myalgias.  Skin: Negative for rash.  Neurological: Negative for dizziness, focal weakness, seizures and headaches.  Psychiatric/Behavioral: Negative for depression.    DRUG ALLERGIES:   Allergies  Allergen Reactions  . Sulfa Antibiotics Hives  . Biaxin [Clarithromycin] Hives  . Influenza A (H1n1) Monoval Vac Other (See Comments)    Pt states that she was told by her MD not to get the influenza vaccine.    . Morphine Other (See Comments)    Reaction:  Dizziness and confusion   . Pyridium [Phenazopyridine Hcl] Other (See Comments)    Reaction:  Unknown   . Ceftriaxone Anxiety  . Latex Rash  . Prednisone Rash  . Tape Rash    VITALS:  Blood pressure (!) 160/59, pulse 84, temperature (!) 97.5 F (36.4 C), temperature source Oral, resp. rate 18, height 5\' 7"  (1.702 m), weight 120.8 kg, SpO2 98 %.  PHYSICAL EXAMINATION:  GENERAL:  77 y.o.-year-old patient lying in the bed with  no acute distress.  EYES: Pupils equal, round, reactive to light and accommodation. No scleral icterus. Extraocular muscles intact.  HEENT: Head atraumatic, normocephalic. Oropharynx and nasopharynx clear.  NECK:  Supple, no jugular venous distention. No thyroid enlargement, no tenderness.  LUNGS: Normal breath sounds bilaterally, no wheezing, some crepitation. No use of accessory muscles of respiration.  CARDIOVASCULAR: S1, S2 normal. No murmurs, rubs, or gallops.  ABDOMEN: Soft, nontender, nondistended. Bowel sounds present. No organomegaly or mass.  EXTREMITIES: b/l pedal edema,no cyanosis, or clubbing. NEUROLOGIC: Pt is lethargic, have generalized weakness, sensations intact.   PSYCHIATRIC: The patient is lethargic.  SKIN: No obvious rash, lesion, or ulcer.   Physical Exam LABORATORY PANEL:   CBC Recent Labs  Lab 07/10/18 0430  WBC 6.0  HGB 8.3*  HCT 24.7*  PLT 222   ------------------------------------------------------------------------------------------------------------------  Chemistries  Recent Labs  Lab 07/06/18 0434 07/06/18 1454  07/10/18 0430  NA 138  --    < > 136  K 4.7  --    < > 4.3  CL 104  --    < > 101  CO2 25  --    < > 31  GLUCOSE 190*  --    < > 209*  BUN 52*  --    < > 45*  CREATININE 3.23*  --    < > 2.22*  CALCIUM 8.4*  --    < > 8.6*  MG 2.3  --   --   --   AST  --  83*  --   --  ALT  --  101*  --   --   ALKPHOS  --  78  --   --   BILITOT  --  0.6  --   --    < > = values in this interval not displayed.   ------------------------------------------------------------------------------------------------------------------  Cardiac Enzymes Recent Labs  Lab 07/04/18 1908  TROPONINI <0.03   ------------------------------------------------------------------------------------------------------------------  RADIOLOGY:  No results found.  ASSESSMENT AND PLAN:   Active Problems:   Hyperkalemia  * Hyperkalemia with  bradycardia. Monitored in stepdown unit.  treated with calcium gluconate, bicarb, NovoLog and D50.    CRRT started by Nephro- helped. Potassium level and renal function improved now.  Dialysis catheter removed.  * Acute renal failure on CKD. Due to cardiorenal syncrome   Due to dehydration Held Lasix on admission, improving after CRRT, cont to monitor. Improving.  * Altered mental status Likely due to UTI, also due to renal failure Vitals are stable and blood sugar is stable. I will check Keppra level. Started on treatment for UTI.  remains letahrgic today, could be combination, pulm edema, renal failure.  * Acute respiratory failure with hypoxia due to acute on chronic diastolic CHF ejection fraction 70%. Hold Lasix at this time due to renal failure. Cardiology consult.  Patient have increased pulmonary artery pressure per echocardiogram. Due to holding Lasix on admission, patient has worsening pulmonary edema. Resume IV Lasix to help with diuresis and continue to monitor renal function now.  nephro switched to oral today.  * UTI Levaquin, follow urine culture. Culture grows enterococcus which is resistant to Levaquin, changed to amoxicillin per sensitivity.  * Hyponatremia.  Follow-up BMP.    Improved.  * Acute on chronic anemia due to chronic renal disease stage III We will check for stool guaiac.  Yesterday patient's hemoglobin had dropped less than 7 and she has received transfusion now hemoglobin is stable.  All the records are reviewed and case discussed with Care Management/Social Workerr. Management plans discussed with the patient, family and they are in agreement.  CODE STATUS: DNR.  TOTAL TIME TAKING CARE OF THIS PATIENT: 35 minutes.  Need to have a family meeting with palliative care to decide goals of care.  POSSIBLE D/C IN 1-2 DAYS, DEPENDING ON CLINICAL CONDITION.   Vaughan Basta M.D on 07/10/2018   Between 7am to 6pm - Pager -  [phone]  After 6pm go to [email or web address] - password [email or web address] [facility]  Office  [phone]  CC: Primary care physician; Leonel Ramsay, MD  Note: This dictation was prepared with Dragon dictation along with smaller phrase technology. Any transcriptional errors that result from this process are unintentional.

## 2018-07-10 NOTE — Progress Notes (Signed)
Family Meeting Note  Advance Directive:yes  Today a meeting took place with the sister.  Patient is unable to participate due MB:WGYKZL capacity drowsy   The following clinical team members were present during this meeting:MD  The following were discussed:Patient's diagnosis: Acute on chronic renal failure, increased pulmonary hypertension, congestive heart failure, generalized weakness, Patient's progosis: < 12 months and Goals for treatment: DNR  Patient is drowsy again today, renal function is still showing chronic renal failure with slight worsening, also appears having pulmonary edema and drowsiness today.  Due to her heart failure issues I do not feel there would be much improvement overall in her condition and she had been having worsening in his condition frequently over last 1 year.  I had discussion with her sister who is power of attorney also, she is leaning towards getting help from hospice now.  I will wait for their meeting with palliative care to make final decision about her place of discharge and other goals.  Additional follow-up to be provided: palliative care  Time spent during discussion:20 minutes  Carolyn Basta, MD

## 2018-07-10 NOTE — Progress Notes (Signed)
SUBJECTIVE: Pt is difficult to awaken, very sleepy, though resting comfortably.  Vitals:   07/09/18 1551 07/09/18 2001 07/10/18 0347 07/10/18 0541  BP: (!) 151/59 (!) 146/57 (!) 153/66 (!) 142/54  Pulse: 60 68 67 (!) 56  Resp:  19 19   Temp:  98.7 F (37.1 C) 98.3 F (36.8 C)   TempSrc:  Oral    SpO2:  95% 100%   Weight:    120.8 kg  Height:        Intake/Output Summary (Last 24 hours) at 07/10/2018 0947 Last data filed at 07/10/2018 0345 Gross per 24 hour  Intake -  Output 3351 ml  Net -3351 ml    LABS: Basic Metabolic Panel: Recent Labs    07/09/18 0354 07/10/18 0430  NA 137 136  K 4.5 4.3  CL 102 101  CO2 30 31  GLUCOSE 287* 209*  BUN 44* 45*  CREATININE 2.30* 2.22*  CALCIUM 8.8* 8.6*   Liver Function Tests: No results for input(s): AST, ALT, ALKPHOS, BILITOT, PROT, ALBUMIN in the last 72 hours. No results for input(s): LIPASE, AMYLASE in the last 72 hours. CBC: Recent Labs    07/08/18 0413 07/10/18 0430  WBC 8.5 6.0  HGB 9.4* 8.3*  HCT 27.9* 24.7*  MCV 89.1 89.7  PLT 245 222   Cardiac Enzymes: No results for input(s): CKTOTAL, CKMB, CKMBINDEX, TROPONINI in the last 72 hours. BNP: Invalid input(s): POCBNP D-Dimer: No results for input(s): DDIMER in the last 72 hours. Hemoglobin A1C: No results for input(s): HGBA1C in the last 72 hours. Fasting Lipid Panel: No results for input(s): CHOL, HDL, LDLCALC, TRIG, CHOLHDL, LDLDIRECT in the last 72 hours. Thyroid Function Tests: No results for input(s): TSH, T4TOTAL, T3FREE, THYROIDAB in the last 72 hours.  Invalid input(s): FREET3 Anemia Panel: No results for input(s): VITAMINB12, FOLATE, FERRITIN, TIBC, IRON, RETICCTPCT in the last 72 hours.   PHYSICAL EXAM General: Pale, ill appearing, resting with mild increase in work of breathing noted.  HEENT:  Normocephalic and atramatic Neck:  No JVD.  Lungs: Clear bilaterally to auscultation and percussion. Heart: HRRR . Normal S1 and S2 without gallops or  murmurs.  Abdomen: Bowel sounds are positive, abdomen soft and non-tender  Msk:  Back normal, normal gait. Normal strength and tone for age. Extremities: 1+ non pitting edema bilaterally   Neuro: Sleeping, difficult to arouse.  TELEMETRY: Sinus brady 56bpm  ASSESSMENT AND PLAN: Acute renal failure with associated hyperkalemia and bradycardia, potassium now corrected in NSR, rate in the 60s. Anemia is slightly improved after blood transfusion, Hgb 8.3. Pt is less short of breath, though still requiring 3L O2. Continue 40mg  torsemide/day.  Active Problems:   Hyperkalemia    Jake Bathe, NP-C 07/10/2018 9:47 AM Cell: [phone]

## 2018-07-10 NOTE — Progress Notes (Signed)
[facility]  ROUNDING NOTE   Subjective:   UOP 3350 mL. Creatinine 2.22(2.30) (2.08) Furosemide 20mg  iv q12.  Dialysis catheter removed  Patient very lethargic  Objective:  Vital signs in last 24 hours:  Temp:  [98 F (36.7 C)-98.7 F (37.1 C)] 98.3 F (36.8 C) (09/23 0347) Pulse Rate:  [56-69] 56 (09/23 0541) Resp:  [18-19] 19 (09/23 0347) BP: (142-153)/(54-66) 142/54 (09/23 0541) SpO2:  [95 %-100 %] 100 % (09/23 0347) Weight:  [120.8 kg] 120.8 kg (09/23 0541)  Weight change: -2 kg Filed Weights   07/08/18 0419 07/09/18 0359 07/10/18 0541  Weight: 124 kg 122.8 kg 120.8 kg    Intake/Output: I/O last 3 completed shifts: In: -  Out: 3351 [Urine:3350; Stool:1]   Intake/Output this shift:  No intake/output data recorded.  Physical Exam: General: Ill appearing  Head: Normocephalic, atraumatic. Dry oral mucosal membranes  Eyes: Anicteric, PERRL  Neck: Supple,    Lungs:  Left base crackles, [place] 3 L   Heart: irregular  Abdomen:  Soft, nontender, obese  Extremities: trace peripheral edema.  Neurologic: lethargic, opens eyes, did not answer questions  Skin: No lesions   Foley in place    Basic Metabolic Panel: Recent Labs  Lab 07/04/18 2325 07/05/18 0310 07/05/18 0422 07/05/18 0423 07/05/18 6945 07/05/18 1514 07/06/18 0434 07/07/18 0443 07/08/18 0413 07/09/18 0354 07/10/18 0430  NA 135 137  --  137 137 138 138 138 139 137 136  K >7.5* 7.3*  --  >7.5* 5.5* 4.2 4.7 4.3 4.3 4.5 4.3  CL 105 106  --  105 104 105 104 103 102 102 101  CO2 16* 15*  --  16* 23 25 25 26 27 30 31   GLUCOSE 239* 233*  --  223* 251* 170* 190* 253* 271* 287* 209*  BUN 80* 79*  --  82* 67* 52* 52* 50* 39* 44* 45*  CREATININE 5.26* 5.05*  --  5.14* 4.10* 3.15* 3.23* 2.68* 2.08* 2.30* 2.22*  CALCIUM 8.6* 8.8*  --  9.0 8.8* 8.5* 8.4* 8.3* 9.2 8.8* 8.6*  MG 2.4  --  2.9*  --  2.5* 2.2 2.3  --   --   --   --   PHOS  --  8.3*  --  8.2* 6.3* 4.7* 4.2  --   --   --   --      Liver Function Tests: Recent Labs  Lab 07/04/18 1908  07/05/18 0423 07/05/18 0904 07/05/18 1514 07/06/18 0434 07/06/18 1454  AST 85*  --   --   --   --   --  83*  ALT 61*  --   --   --   --   --  101*  ALKPHOS 78  --   --   --   --   --  78  BILITOT 0.4  --   --   --   --   --  0.6  PROT 7.0  --   --   --   --   --  5.7*  ALBUMIN 3.8   < > 3.6 3.5 3.4* 3.1* 3.2*   < > = values in this interval not displayed.   Recent Labs  Lab 07/04/18 1908  LIPASE 63*   Recent Labs  Lab 07/06/18 2000  AMMONIA 22    CBC: Recent Labs  Lab 07/05/18 0422 07/06/18 0434 07/07/18 0443 07/08/18 0413 07/10/18 0430  WBC 10.0 7.4 6.7 8.5 6.0  NEUTROABS  --  4.3  --   --   --  HGB 7.3* 6.4* 8.3* 9.4* 8.3*  HCT 22.5* 19.1* 24.4* 27.9* 24.7*  MCV 93.4 90.9 89.1 89.1 89.7  PLT 301 234 232 245 222    Cardiac Enzymes: Recent Labs  Lab 07/04/18 1908  TROPONINI <0.03    BNP: Invalid input(s): POCBNP  CBG: Recent Labs  Lab 07/09/18 0740 07/09/18 1148 07/09/18 1657 07/09/18 1957 07/10/18 0820  GLUCAP 230* 211* 312* 72* 180*    Microbiology: Results for orders placed or performed during the hospital encounter of 07/04/18  MRSA PCR Screening     Status: None   Collection Time: 07/04/18 10:10 PM  Result Value Ref Range Status   MRSA by PCR NEGATIVE NEGATIVE Final    Comment:        The GeneXpert MRSA Assay (FDA approved for NASAL specimens only), is one component of a comprehensive MRSA colonization surveillance program. It is not intended to diagnose MRSA infection nor to guide or monitor treatment for MRSA infections. Performed at [facility], [address] [place], [place] [address]   Culture, Urine     Status: Abnormal   Collection Time: 07/05/18  5:13 AM  Result Value Ref Range Status   Specimen Description   Final    URINE, RANDOM Performed at [facility], [address] [place], [place] [address]    Special Requests   Final     NONE Performed at [facility], [place], [place] [address]    Culture 30,000 COLONIES/mL ENTEROCOCCUS FAECALIS (A)  Final   Report Status 07/07/2018 FINAL  Final   Organism ID, Bacteria ENTEROCOCCUS FAECALIS (A)  Final      Susceptibility   Enterococcus faecalis - MIC*    AMPICILLIN <=2 SENSITIVE Sensitive     LEVOFLOXACIN >=8 RESISTANT Resistant     NITROFURANTOIN 64 INTERMEDIATE Intermediate     VANCOMYCIN 1 SENSITIVE Sensitive     * 30,000 COLONIES/mL ENTEROCOCCUS FAECALIS    Coagulation Studies: No results for input(s): LABPROT, INR in the last 72 hours.  Urinalysis: No results for input(s): COLORURINE, LABSPEC, PHURINE, GLUCOSEU, HGBUR, BILIRUBINUR, KETONESUR, PROTEINUR, UROBILINOGEN, NITRITE, LEUKOCYTESUR in the last 72 hours.  Invalid input(s): APPERANCEUR    Imaging: No results found.   Medications:   . sodium chloride     . acidophilus  1 capsule Oral QHS  . amoxicillin  500 mg Oral Q12H  . aspirin EC  81 mg Oral Daily  . citalopram  10 mg Oral Daily  . docusate sodium  100 mg Oral BID  . furosemide  20 mg Intravenous Q12H  . heparin injection (subcutaneous)  5,000 Units Subcutaneous Q8H  . hydrALAZINE  25 mg Oral Q8H  . insulin aspart  0-5 Units Subcutaneous QHS  . insulin aspart  0-9 Units Subcutaneous TID WC  . insulin glargine  15 Units Subcutaneous QHS  . iron polysaccharides  150 mg Oral TID  . lamoTRIgine  25 mg Oral Daily  . levETIRAcetam  250 mg Oral BID  . loratadine  10 mg Oral Daily  . mouth rinse  15 mL Mouth Rinse BID  . Melatonin  5 mg Oral QHS  . metoprolol tartrate  25 mg Oral BID  . OLANZapine zydis  10 mg Oral QHS  . pantoprazole  40 mg Oral BID  . polyethylene glycol  17 g Oral Daily  . senna  1 tablet Oral Daily  . sodium chloride flush  3 mL Intravenous Q12H   sodium chloride, acetaminophen **OR** acetaminophen, albuterol,  bisacodyl, ondansetron **OR** ondansetron (ZOFRAN) IV, senna-docusate, sodium  chloride flush  Assessment/ Plan:  [name] is a 77 y.o. white female with congestive heart failure, hypertension, diabetes mellitus type II, obstructive sleep apnea, nephrolithiasis who has been admitted to [facility] on 07/04/2018 for acute renal failure, hyperkalemia, metabolic acidosis requiring renal replacement therapy. Taking potassium chloride PO at home.  2 D Echo 07/05/18: LVEF 42-70%, Grade 2 diastolic dysfunction, Systolic pressure was moderately to severely   increased. Pulmonary artery systolic pressure is 62-37 mmHg plus   central venous/right atrial pressure.  1. 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  Secondary to acute cardiorenal syndrome. 2. Hyperkalemia 3. Metabolic Acidosis 4. Acute exacerbation of diastolic congestive failure 5. Normocytic Anemia 6. Hyperphosphatemia 7. Diabetes mellitus type II with chronic kidney disease: hemoglobin A1c 7.6% on 04/22/18  Plan May change diuretic to torsemide 40 mg daily AM  Family considering hospice D/c outpatient potassium No ACE-I/ARB due to ARF, hyperkalemia. Can be considered in future   LOS: 6 Carolyn Valdez 9/23/20198:33 AM

## 2018-07-10 NOTE — Progress Notes (Signed)
Physical Therapy Treatment Patient Details Name: Carolyn Valdez MRN: [id number] DOB: 27-May-1941 Today's Date: 07/10/2018    History of Present Illness 77 yo Female admitted on 07/04/2018 with dx of hyperkalemia. she had syncope episode at home. She was found to have hypertension and tachycardia. Patient was admitted to to telemetry; PMH significant: Anxiety, CHF, DM, past RTKA, HTN, HLD, Recurrent UTI;     PT Comments    Pt alert sitting up in bed this afternoon (pt reports she is not a morning person and has a tough time waking up) and agreeable to therapy.  Pt pleasant during session.  No c/o pain.  Mod assist semi-supine to sit and min assist with transfers and walking a few feet bed to recliner with RW.  Pt c/o dizziness initially sitting on edge of bed but resolved with time sitting edge of bed.  Limited activity d/t generalized weakness.  O2 sats 98% or greater on 3 L O2 via nasal cannula during session.  Will continue to progress pt with strengthening and progressive functional mobility per pt tolerance.    Follow Up Recommendations  SNF     Equipment Recommendations  Rolling walker with 5" wheels    Recommendations for Other Services       Precautions / Restrictions Precautions Precautions: Fall Restrictions Weight Bearing Restrictions: No    Mobility  Bed Mobility Overal bed mobility: Needs Assistance Bed Mobility: Supine to Sit     Supine to sit: Mod assist     General bed mobility comments: assist for trunk; vc's for scooting to edge of bed  Transfers Overall transfer level: Needs assistance Equipment used: Rolling walker (2 wheeled) Transfers: Sit to/from Stand Sit to Stand: Min assist         General transfer comment: assist to initiate stand; vc's for technique  Ambulation/Gait Ambulation/Gait assistance: Min guard;Min assist Gait Distance (Feet): 3 Feet(bed to recliner) Assistive device: Rolling walker (2 wheeled) Gait Pattern/deviations: Narrow base  of support Gait velocity: decreased   General Gait Details: decreased B step length/foot clearance/heelstrike   Stairs             Wheelchair Mobility    Modified Rankin (Stroke Patients Only)       Balance Overall balance assessment: Needs assistance Sitting-balance support: No upper extremity supported;Feet supported Sitting balance-Leahy Scale: Good Sitting balance - Comments: steady sitting reaching within BOS   Standing balance support: Single extremity supported Standing balance-Leahy Scale: Poor Standing balance comment: requires at least single UE support for static standing balance                            Cognition Arousal/Alertness: Awake/alert Behavior During Therapy: WFL for tasks assessed/performed Overall Cognitive Status: Within Functional Limits for tasks assessed                                        Exercises      General Comments   Nursing cleared pt for participation in physical therapy.  Pt agreeable to PT session.      Pertinent Vitals/Pain Pain Assessment: No/denies pain  HR WFL during session.    Home Living                      Prior Function            PT Goals (  current goals can now be found in the care plan section) Acute Rehab PT Goals Patient Stated Goal: to go home PT Goal Formulation: With patient Time For Goal Achievement: 07/22/18 Potential to Achieve Goals: Fair Progress towards PT goals: Progressing toward goals    Frequency    Min 2X/week      PT Plan Current plan remains appropriate    Co-evaluation              AM-PAC PT "6 Clicks" Daily Activity  Outcome Measure  Difficulty turning over in bed (including adjusting bedclothes, sheets and blankets)?: A Lot Difficulty moving from lying on back to sitting on the side of the bed? : Unable Difficulty sitting down on and standing up from a chair with arms (e.g., wheelchair, bedside commode, etc,.)?:  Unable Help needed moving to and from a bed to chair (including a wheelchair)?: A Little Help needed walking in hospital room?: A Little Help needed climbing 3-5 steps with a railing? : A Lot 6 Click Score: 12    End of Session Equipment Utilized During Treatment: Gait belt;Oxygen(3 L O2 via nasal cannula) Activity Tolerance: Patient limited by fatigue Patient left: in chair;with call bell/phone within reach;with chair alarm set Nurse Communication: Mobility status;Precautions PT Visit Diagnosis: Unsteadiness on feet (R26.81);Muscle weakness (generalized) (M62.81);Other abnormalities of gait and mobility (R26.89);Difficulty in walking, not elsewhere classified (R26.2)     Time: 9675-9163 PT Time Calculation (min) (ACUTE ONLY): 25 min  Charges:  $Therapeutic Activity: 23-37 mins                    Leitha Bleak, PT 07/10/18, 4:43 PM [phone]

## 2018-07-11 LAB — GLUCOSE, CAPILLARY
GLUCOSE-CAPILLARY: 241 mg/dL — AB (ref 70–99)
GLUCOSE-CAPILLARY: 258 mg/dL — AB (ref 70–99)
GLUCOSE-CAPILLARY: 152 mg/dL — AB (ref 70–99)
Glucose-Capillary: 257 mg/dL — ABNORMAL HIGH (ref 70–99)

## 2018-07-11 LAB — BASIC METABOLIC PANEL
ANION GAP: 5 (ref 5–15)
BUN: 45 mg/dL — ABNORMAL HIGH (ref 8–23)
CO2: 31 mmol/L (ref 22–32)
Calcium: 8.7 mg/dL — ABNORMAL LOW (ref 8.9–10.3)
Chloride: 101 mmol/L (ref 98–111)
Creatinine, Ser: 2.23 mg/dL — ABNORMAL HIGH (ref 0.44–1.00)
GFR calc Af Amer: 24 mL/min — ABNORMAL LOW (ref >60)
GFR calc non Af Amer: 20 mL/min — ABNORMAL LOW (ref >60)
Glucose, Bld: 277 mg/dL — ABNORMAL HIGH (ref 70–99)
POTASSIUM: 4.4 mmol/L (ref 3.5–5.1)
SODIUM: 137 mmol/L (ref 135–145)

## 2018-07-11 MED ORDER — CLONAZEPAM 0.25 MG PO TBDP
0.2500 mg | ORAL_TABLET | Freq: Two times a day (BID) | ORAL | Status: DC
  Administered 2018-07-11 – 2018-07-14 (×7): 0.25 mg via ORAL
  Filled 2018-07-11 (×8): qty 1

## 2018-07-11 MED ORDER — INSULIN GLARGINE 100 UNIT/ML ~~LOC~~ SOLN
10.0000 [IU] | Freq: Every day | SUBCUTANEOUS | Status: DC
  Administered 2018-07-11 – 2018-07-14 (×4): 10 [IU] via SUBCUTANEOUS
  Filled 2018-07-11 (×4): qty 0.1

## 2018-07-11 NOTE — Progress Notes (Signed)
SUBJECTIVE: Patient denies any chest pain or shortness of breath   Vitals:   07/10/18 1959 07/11/18 0423 07/11/18 0500 07/11/18 0900  BP: (!) 158/57 (!) 153/48  (!) 158/55  Pulse: 68 70  72  Resp: 20 20  16   Temp: 98.1 F (36.7 C) 98.6 F (37 C)  98.4 F (36.9 C)  TempSrc: Oral Oral  Oral  SpO2: 100% 97%  98%  Weight:   120.6 kg   Height:        Intake/Output Summary (Last 24 hours) at 07/11/2018 1028 Last data filed at 07/11/2018 1026 Gross per 24 hour  Intake 600 ml  Output 3600 ml  Net -3000 ml    LABS: Basic Metabolic Panel: Recent Labs    07/10/18 0430 07/11/18 0430  NA 136 137  K 4.3 4.4  CL 101 101  CO2 31 31  GLUCOSE 209* 277*  BUN 45* 45*  CREATININE 2.22* 2.23*  CALCIUM 8.6* 8.7*   Liver Function Tests: No results for input(s): AST, ALT, ALKPHOS, BILITOT, PROT, ALBUMIN in the last 72 hours. No results for input(s): LIPASE, AMYLASE in the last 72 hours. CBC: Recent Labs    07/10/18 0430  WBC 6.0  HGB 8.3*  HCT 24.7*  MCV 89.7  PLT 222   Cardiac Enzymes: No results for input(s): CKTOTAL, CKMB, CKMBINDEX, TROPONINI in the last 72 hours. BNP: Invalid input(s): POCBNP D-Dimer: No results for input(s): DDIMER in the last 72 hours. Hemoglobin A1C: No results for input(s): HGBA1C in the last 72 hours. Fasting Lipid Panel: No results for input(s): CHOL, HDL, LDLCALC, TRIG, CHOLHDL, LDLDIRECT in the last 72 hours. Thyroid Function Tests: No results for input(s): TSH, T4TOTAL, T3FREE, THYROIDAB in the last 72 hours.  Invalid input(s): FREET3 Anemia Panel: No results for input(s): VITAMINB12, FOLATE, FERRITIN, TIBC, IRON, RETICCTPCT in the last 72 hours.   PHYSICAL EXAM General: Well developed, well nourished, in no acute distress HEENT:  Normocephalic and atramatic Neck:  No JVD.  Lungs: Clear bilaterally to auscultation and percussion. Heart: HRRR . Normal S1 and S2 without gallops or murmurs.  Abdomen: Bowel sounds are positive, abdomen  soft and non-tender  Msk:  Back normal, normal gait. Normal strength and tone for age. Extremities: No clubbing, cyanosis or edema.   Neuro: Alert and oriented X 3. Psych:  Good affect, responds appropriately  TELEMETRY: Sinus rhythm   relationship is ASSESSMENT AND PLAN: Status post sinus bradycardia due to metabolic reasons such as renal insufficiency and electrolyte disturbance and currently heart rate is 72 and patient is feeling much better.  Active Problems:   Hyperkalemia    Dionisio David, MD, [facility] 07/11/2018 10:28 AM

## 2018-07-11 NOTE — Progress Notes (Signed)
[place] [facility] [place] NAME: Carolyn Valdez    MR#:  [id number]  DATE OF BIRTH:  07-03-1941  SUBJECTIVE:  CHIEF COMPLAINT:   Chief Complaint  Patient presents with  . Weakness   Came with feeling weak, noted bradycardia- High K. Had CRRT- K came down, HR stable now. Feels weak still. Still SOB, have good diuresis. Had very good diuresis yesterday after starting on torsemide and feels much better today.  REVIEW OF SYSTEMS:    Review of Systems  Constitutional: Positive for malaise/fatigue. Negative for fever and weight loss.  HENT: Negative for congestion, ear discharge and sore throat.   Eyes: Negative for blurred vision and discharge.  Respiratory: Positive for shortness of breath. Negative for sputum production.   Cardiovascular: Positive for leg swelling. Negative for chest pain, palpitations and orthopnea.  Gastrointestinal: Negative for abdominal pain, diarrhea, nausea and vomiting.  Genitourinary: Negative for dysuria and frequency.  Musculoskeletal: Negative for joint pain and myalgias.  Skin: Negative for rash.  Neurological: Negative for dizziness, focal weakness, seizures and headaches.  Psychiatric/Behavioral: Negative for depression.    DRUG ALLERGIES:   Allergies  Allergen Reactions  . Sulfa Antibiotics Hives  . Biaxin [Clarithromycin] Hives  . Influenza A (H1n1) Monoval Vac Other (See Comments)    Pt states that she was told by her MD not to get the influenza vaccine.    . Morphine Other (See Comments)    Reaction:  Dizziness and confusion   . Pyridium [Phenazopyridine Hcl] Other (See Comments)    Reaction:  Unknown   . Ceftriaxone Anxiety  . Latex Rash  . Prednisone Rash  . Tape Rash    VITALS:  Blood pressure (!) 158/55, pulse 72, temperature 98.4 F (36.9 C), temperature source Oral, resp. rate 16, height 5\' 7"  (1.702 m), weight 120.6 kg, SpO2 98 %.  PHYSICAL EXAMINATION:  GENERAL:  77 y.o.-year-old patient  lying in the bed with no acute distress.  EYES: Pupils equal, round, reactive to light and accommodation. No scleral icterus. Extraocular muscles intact.  HEENT: Head atraumatic, normocephalic. Oropharynx and nasopharynx clear.  NECK:  Supple, no jugular venous distention. No thyroid enlargement, no tenderness.  LUNGS: Normal breath sounds bilaterally, no wheezing, some crepitation. No use of accessory muscles of respiration.  On supplemental oxygen CARDIOVASCULAR: S1, S2 normal. No murmurs, rubs, or gallops.  ABDOMEN: Soft, nontender, nondistended. Bowel sounds present. No organomegaly or mass.  EXTREMITIES: b/l pedal edema,no cyanosis, or clubbing. NEUROLOGIC: Pt is alert, have generalized weakness, sensations intact.  Follows simple command.   PSYCHIATRIC: The patient is alert and oriented.  SKIN: No obvious rash, lesion, or ulcer.   Physical Exam LABORATORY PANEL:   CBC Recent Labs  Lab 07/10/18 0430  WBC 6.0  HGB 8.3*  HCT 24.7*  PLT 222   ------------------------------------------------------------------------------------------------------------------  Chemistries  Recent Labs  Lab 07/06/18 0434 07/06/18 1454  07/11/18 0430  NA 138  --    < > 137  K 4.7  --    < > 4.4  CL 104  --    < > 101  CO2 25  --    < > 31  GLUCOSE 190*  --    < > 277*  BUN 52*  --    < > 45*  CREATININE 3.23*  --    < > 2.23*  CALCIUM 8.4*  --    < > 8.7*  MG 2.3  --   --   --  AST  --  83*  --   --   ALT  --  101*  --   --   ALKPHOS  --  78  --   --   BILITOT  --  0.6  --   --    < > = values in this interval not displayed.   ------------------------------------------------------------------------------------------------------------------  Cardiac Enzymes Recent Labs  Lab 07/04/18 1908  TROPONINI <0.03   ------------------------------------------------------------------------------------------------------------------  RADIOLOGY:  No results found.  ASSESSMENT AND PLAN:    Active Problems:   Hyperkalemia  * Hyperkalemia with bradycardia. Monitored in stepdown unit.  treated with calcium gluconate, bicarb, NovoLog and D50.    CRRT started by Nephro- helped. Potassium level and renal function improved now.  Dialysis catheter removed.  * Acute renal failure on CKD. Due to cardiorenal syncrome   Due to dehydration Held Lasix on admission, improving after CRRT, cont to monitor. Improving.   * Altered mental status Likely due to UTI, also due to renal failure Vitals are stable and blood sugar is stable. I will check Keppra level. Started on treatment for UTI. Much improved.  * Acute respiratory failure with hypoxia due to acute on chronic diastolic CHF ejection fraction 70%. Hold Lasix at this time due to renal failure. Cardiology consult.  Patient have increased pulmonary artery pressure per echocardiogram. Due to holding Lasix on admission, patient has worsening pulmonary edema. Resume IV Lasix to help with diuresis and continue to monitor renal function now.  nephro switched to oral torsemide and it seems to be helping much.  * UTI Levaquin, follow urine culture. Culture grows enterococcus which is resistant to Levaquin, changed to amoxicillin per sensitivity. We may give Total 7 days treatment.  * Hyponatremia.  Follow-up BMP.    Improved.  * Acute on chronic anemia due to chronic renal disease stage III Awaited stool guaiac.   patient's hemoglobin had dropped less than 7 and she has received transfusion now hemoglobin is stable.  All the records are reviewed and case discussed with Care Management/Social Workerr. Management plans discussed with the patient, family and they are in agreement.  CODE STATUS: DNR.  TOTAL TIME TAKING CARE OF THIS PATIENT: 35 minutes.  Need to have a family meeting with palliative care to decide goals of care. Patient was suggested to go to SNF by physical therapy, sister also have concerned that there is  not enough help at home.  But as patient is completely alert and oriented now and she strongly wished to go home.  I encouraged her to discuss with palliative care and her family and come up with the plan to get up to home health at home. Also encouraged to get hospice involved in her care at home.  POSSIBLE D/C IN 1-2 DAYS, DEPENDING ON CLINICAL CONDITION.   Vaughan Basta M.D on 07/11/2018   Between 7am to 6pm - Pager - [phone]  After 6pm go to [email or web address] - password [email or web address] [facility]  Office  [phone]  CC: Primary care physician; Leonel Ramsay, MD  Note: This dictation was prepared with Dragon dictation along with smaller phrase technology. Any transcriptional errors that result from this process are unintentional.

## 2018-07-11 NOTE — Progress Notes (Signed)
[facility]  ROUNDING NOTE   Subjective:   UOP 3600 mL. Creatinine 2.23, stable Good response to Oral torsemide Patient more alert today Eating breakfast  Objective:  Vital signs in last 24 hours:  Temp:  [97.5 F (36.4 C)-98.6 F (37 C)] 98.4 F (36.9 C) (09/24 0900) Pulse Rate:  [68-89] 72 (09/24 0900) Resp:  [16-20] 16 (09/24 0900) BP: (153-185)/(48-70) 158/55 (09/24 0900) SpO2:  [97 %-100 %] 98 % (09/24 0900) Weight:  [120.6 kg] 120.6 kg (09/24 0500)  Weight change: -0.2 kg Filed Weights   07/09/18 0359 07/10/18 0541 07/11/18 0500  Weight: 122.8 kg 120.8 kg 120.6 kg    Intake/Output: I/O last 3 completed shifts: In: 480 [P.O.:480] Out: 5401 [Urine:5400; Stool:1]   Intake/Output this shift:  No intake/output data recorded.  Physical Exam: General: Ill appearing  Head: Normocephalic, atraumatic.    Eyes: Anicteric,    Neck: Supple,    Lungs:  Left base crackles, improved exam, [place] 3 L   Heart: irregular  Abdomen:  Soft, nontender, obese  Extremities: trace peripheral edema.  Neurologic: Alert and oriented  Skin: No lesions   Foley in place    Basic Metabolic Panel: Recent Labs  Lab 07/04/18 2325 07/05/18 0310 07/05/18 0422 07/05/18 0423 07/05/18 3151 07/05/18 1514 07/06/18 0434 07/07/18 0443 07/08/18 0413 07/09/18 0354 07/10/18 0430 07/11/18 0430  NA 135 137  --  137 137 138 138 138 139 137 136 137  K >7.5* 7.3*  --  >7.5* 5.5* 4.2 4.7 4.3 4.3 4.5 4.3 4.4  CL 105 106  --  105 104 105 104 103 102 102 101 101  CO2 16* 15*  --  16* 23 25 25 26 27 30 31 31   GLUCOSE 239* 233*  --  223* 251* 170* 190* 253* 271* 287* 209* 277*  BUN 80* 79*  --  82* 67* 52* 52* 50* 39* 44* 45* 45*  CREATININE 5.26* 5.05*  --  5.14* 4.10* 3.15* 3.23* 2.68* 2.08* 2.30* 2.22* 2.23*  CALCIUM 8.6* 8.8*  --  9.0 8.8* 8.5* 8.4* 8.3* 9.2 8.8* 8.6* 8.7*  MG 2.4  --  2.9*  --  2.5* 2.2 2.3  --   --   --   --   --   PHOS  --  8.3*  --  8.2* 6.3* 4.7* 4.2  --   --    --   --   --     Liver Function Tests: Recent Labs  Lab 07/04/18 1908  07/05/18 0423 07/05/18 0904 07/05/18 1514 07/06/18 0434 07/06/18 1454  AST 85*  --   --   --   --   --  83*  ALT 61*  --   --   --   --   --  101*  ALKPHOS 78  --   --   --   --   --  78  BILITOT 0.4  --   --   --   --   --  0.6  PROT 7.0  --   --   --   --   --  5.7*  ALBUMIN 3.8   < > 3.6 3.5 3.4* 3.1* 3.2*   < > = values in this interval not displayed.   Recent Labs  Lab 07/04/18 1908  LIPASE 63*   Recent Labs  Lab 07/06/18 2000  AMMONIA 22    CBC: Recent Labs  Lab 07/05/18 0422 07/06/18 0434 07/07/18 0443 07/08/18 0413 07/10/18 0430  WBC 10.0 7.4 6.7 8.5 6.0  NEUTROABS  --  4.3  --   --   --   HGB 7.3* 6.4* 8.3* 9.4* 8.3*  HCT 22.5* 19.1* 24.4* 27.9* 24.7*  MCV 93.4 90.9 89.1 89.1 89.7  PLT 301 234 232 245 222    Cardiac Enzymes: Recent Labs  Lab 07/04/18 1908  TROPONINI <0.03    BNP: Invalid input(s): POCBNP  CBG: Recent Labs  Lab 07/10/18 0820 07/10/18 1156 07/10/18 1651 07/10/18 2001 07/11/18 0742  GLUCAP 180* 194* 64* 310* 57*    Microbiology: Results for orders placed or performed during the hospital encounter of 07/04/18  MRSA PCR Screening     Status: None   Collection Time: 07/04/18 10:10 PM  Result Value Ref Range Status   MRSA by PCR NEGATIVE NEGATIVE Final    Comment:        The GeneXpert MRSA Assay (FDA approved for NASAL specimens only), is one component of a comprehensive MRSA colonization surveillance program. It is not intended to diagnose MRSA infection nor to guide or monitor treatment for MRSA infections. Performed at [facility], [address] [place], [place] [address]   Culture, Urine     Status: Abnormal   Collection Time: 07/05/18  5:13 AM  Result Value Ref Range Status   Specimen Description   Final    URINE, RANDOM Performed at [facility], [address] [place], [place] [address]    Special  Requests   Final    NONE Performed at [facility], [place],  [address]    Culture 30,000 COLONIES/mL ENTEROCOCCUS FAECALIS (A)  Final   Report Status 07/07/2018 FINAL  Final   Organism ID, Bacteria ENTEROCOCCUS FAECALIS (A)  Final      Susceptibility   Enterococcus faecalis - MIC*    AMPICILLIN <=2 SENSITIVE Sensitive     LEVOFLOXACIN >=8 RESISTANT Resistant     NITROFURANTOIN 64 INTERMEDIATE Intermediate     VANCOMYCIN 1 SENSITIVE Sensitive     * 30,000 COLONIES/mL ENTEROCOCCUS FAECALIS    Coagulation Studies: No results for input(s): LABPROT, INR in the last 72 hours.  Urinalysis: No results for input(s): COLORURINE, LABSPEC, PHURINE, GLUCOSEU, HGBUR, BILIRUBINUR, KETONESUR, PROTEINUR, UROBILINOGEN, NITRITE, LEUKOCYTESUR in the last 72 hours.  Invalid input(s): APPERANCEUR    Imaging: No results found.   Medications:   . sodium chloride     . acidophilus  1 capsule Oral QHS  . amoxicillin  500 mg Oral Q12H  . aspirin EC  81 mg Oral Daily  . citalopram  10 mg Oral Daily  . docusate sodium  100 mg Oral BID  . heparin injection (subcutaneous)  5,000 Units Subcutaneous Q8H  . hydrALAZINE  25 mg Oral Q8H  . insulin aspart  0-5 Units Subcutaneous QHS  . insulin aspart  0-9 Units Subcutaneous TID WC  . insulin glargine  10 Units Subcutaneous Daily  . insulin glargine  15 Units Subcutaneous QHS  . iron polysaccharides  150 mg Oral TID  . lamoTRIgine  25 mg Oral Daily  . levETIRAcetam  250 mg Oral BID  . loratadine  10 mg Oral Daily  . mouth rinse  15 mL Mouth Rinse BID  . Melatonin  5 mg Oral QHS  . metoprolol tartrate  25 mg Oral BID  . OLANZapine zydis  10 mg Oral QHS  . pantoprazole  40 mg Oral BID  . polyethylene glycol  17 g Oral Daily  . senna  1 tablet Oral Daily  . sodium chloride flush  3 mL Intravenous Q12H  . torsemide  40 mg Oral Daily   sodium chloride, acetaminophen **OR** acetaminophen, albuterol, bisacodyl,  ondansetron **OR** ondansetron (ZOFRAN) IV, senna-docusate, sodium chloride flush  Assessment/ Plan:  [name] is a 77 y.o. white female with congestive heart failure, hypertension, diabetes mellitus type II, obstructive sleep apnea, nephrolithiasis who has been admitted to [facility] on 07/04/2018 for acute renal failure, hyperkalemia, metabolic acidosis requiring renal replacement therapy. Taking potassium chloride PO at home.  2 D Echo 07/05/18: LVEF 17-61%, Grade 2 diastolic dysfunction, Systolic pressure was moderately to severely   increased. Pulmonary artery systolic pressure is 60-73 mmHg plus   central venous/right atrial pressure.  1. 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  Secondary to acute cardiorenal syndrome. 2. Hyperkalemia 3. Metabolic Acidosis 4. Acute exacerbation of diastolic congestive failure 5. Normocytic Anemia 6. Hyperphosphatemia 7. Diabetes mellitus type II with chronic kidney disease: hemoglobin A1c 7.6% on 04/22/18  Plan Continue torsemide 40 mg daily AM  Family considering hospice D/c outpatient potassium No ACE-I/ARB due to ARF, hyperkalemia. Can be considered in future Follow up outpatient   LOS: 7 Carolyn Valdez 9/24/20199:33 AM

## 2018-07-11 NOTE — Plan of Care (Signed)

## 2018-07-12 LAB — BASIC METABOLIC PANEL
ANION GAP: 8 (ref 5–15)
BUN: 42 mg/dL — ABNORMAL HIGH (ref 8–23)
CO2: 32 mmol/L (ref 22–32)
Calcium: 8.9 mg/dL (ref 8.9–10.3)
Chloride: 100 mmol/L (ref 98–111)
Creatinine, Ser: 1.99 mg/dL — ABNORMAL HIGH (ref 0.44–1.00)
GFR calc Af Amer: 27 mL/min — ABNORMAL LOW (ref >60)
GFR, EST NON AFRICAN AMERICAN: 23 mL/min — AB (ref >60)
GLUCOSE: 245 mg/dL — AB (ref 70–99)
POTASSIUM: 4.2 mmol/L (ref 3.5–5.1)
Sodium: 140 mmol/L (ref 135–145)

## 2018-07-12 LAB — CBC
HEMATOCRIT: 25.1 % — AB (ref 35.0–47.0)
HEMOGLOBIN: 8.3 g/dL — AB (ref 12.0–16.0)
MCH: 29.4 pg (ref 26.0–34.0)
MCHC: 33 g/dL (ref 32.0–36.0)
MCV: 89.1 fL (ref 80.0–100.0)
Platelets: 219 10*3/uL (ref 150–440)
RBC: 2.82 MIL/uL — AB (ref 3.80–5.20)
RDW: 17.8 % — ABNORMAL HIGH (ref 11.5–14.5)
WBC: 5.1 10*3/uL (ref 3.6–11.0)

## 2018-07-12 LAB — GLUCOSE, CAPILLARY
GLUCOSE-CAPILLARY: 241 mg/dL — AB (ref 70–99)
GLUCOSE-CAPILLARY: 209 mg/dL — AB (ref 70–99)
Glucose-Capillary: 202 mg/dL — ABNORMAL HIGH (ref 70–99)
Glucose-Capillary: 192 mg/dL — ABNORMAL HIGH (ref 70–99)
Glucose-Capillary: 161 mg/dL — ABNORMAL HIGH (ref 70–99)

## 2018-07-12 NOTE — Progress Notes (Signed)
[facility] NAME: Carolyn Valdez    MR#:  [id number]  DATE OF BIRTH:  [birth year]  SUBJECTIVE:  CHIEF COMPLAINT:   Chief Complaint  Patient presents with  . Weakness   Came with feeling weak, noted bradycardia- High K. Had CRRT- K came down, HR stable now. Feels weak still. Still SOB, have good diuresis. Had very good diuresis after starting on torsemide,  Drowsy today morning.  REVIEW OF SYSTEMS:    Review of Systems  Constitutional: Positive for malaise/fatigue. Negative for fever and weight loss.  HENT: Negative for congestion, ear discharge and sore throat.   Eyes: Negative for blurred vision and discharge.  Respiratory: Positive for shortness of breath. Negative for sputum production.   Cardiovascular: Positive for leg swelling. Negative for chest pain, palpitations and orthopnea.  Gastrointestinal: Negative for abdominal pain, diarrhea, nausea and vomiting.  Genitourinary: Negative for dysuria and frequency.  Musculoskeletal: Negative for joint pain and myalgias.  Skin: Negative for rash.  Neurological: Negative for dizziness, focal weakness, seizures and headaches.  Psychiatric/Behavioral: Negative for depression.    DRUG ALLERGIES:   Allergies  Allergen Reactions  . Sulfa Antibiotics Hives  . Biaxin [Clarithromycin] Hives  . Influenza A (H1n1) Monoval Vac Other (See Comments)    Pt states that she was told by her MD not to get the influenza vaccine.    . Morphine Other (See Comments)    Reaction:  Dizziness and confusion   . Pyridium [Phenazopyridine Hcl] Other (See Comments)    Reaction:  Unknown   . Ceftriaxone Anxiety  . Latex Rash  . Prednisone Rash  . Tape Rash    VITALS:  Blood pressure (!) 156/60, pulse 66, temperature 98.1 F (36.7 C), temperature source Oral, resp. rate (!) 24, height 5\' 7"  (1.702 m), weight 117.2 kg, SpO2 98 %.  PHYSICAL EXAMINATION:  GENERAL:  77 y.o.-year-old patient lying in the  bed with no acute distress.  EYES: Pupils equal, round, reactive to light and accommodation. No scleral icterus. Extraocular muscles intact.  HEENT: Head atraumatic, normocephalic. Oropharynx and nasopharynx clear.  NECK:  Supple, no jugular venous distention. No thyroid enlargement, no tenderness.  LUNGS: Normal breath sounds bilaterally, no wheezing, some crepitation. No use of accessory muscles of respiration.  On supplemental oxygen CARDIOVASCULAR: S1, S2 normal. No murmurs, rubs, or gallops.  ABDOMEN: Soft, nontender, nondistended. Bowel sounds present. No organomegaly or mass.  EXTREMITIES: b/l pedal edema,no cyanosis, or clubbing. NEUROLOGIC: Pt is alert, have generalized weakness, sensations intact.  Follows simple command.   PSYCHIATRIC: The patient is alert and oriented.  SKIN: No obvious rash, lesion, or ulcer.   Physical Exam LABORATORY PANEL:   CBC Recent Labs  Lab 07/12/18 0358  WBC 5.1  HGB 8.3*  HCT 25.1*  PLT 219   ------------------------------------------------------------------------------------------------------------------  Chemistries  Recent Labs  Lab 07/06/18 0434 07/06/18 1454  07/12/18 0358  NA 138  --    < > 140  K 4.7  --    < > 4.2  CL 104  --    < > 100  CO2 25  --    < > 32  GLUCOSE 190*  --    < > 245*  BUN 52*  --    < > 42*  CREATININE 3.23*  --    < > 1.99*  CALCIUM 8.4*  --    < > 8.9  MG 2.3  --   --   --  AST  --  83*  --   --   ALT  --  101*  --   --   ALKPHOS  --  78  --   --   BILITOT  --  0.6  --   --    < > = values in this interval not displayed.   ------------------------------------------------------------------------------------------------------------------  Cardiac Enzymes No results for input(s): TROPONINI in the last 168 hours. ------------------------------------------------------------------------------------------------------------------  RADIOLOGY:  No results found.  ASSESSMENT AND PLAN:   Active  Problems:   Hyperkalemia  * Hyperkalemia with bradycardia. Monitored in stepdown unit.  treated with calcium gluconate, bicarb, NovoLog and D50.    CRRT started by Nephro- helped. Potassium level and renal function improved now.  Dialysis catheter removed.  * Acute renal failure on CKD. Due to cardiorenal syncrome   Due to dehydration Held Lasix on admission, improving after CRRT, cont to monitor. Improving.  torsemide seems to be helping better with diuresis.   * Altered mental status Likely due to UTI, also due to renal failure Vitals are stable and blood sugar is stable. Started on treatment for UTI. Much improved.  * Acute respiratory failure with hypoxia due to acute on chronic diastolic CHF ejection fraction 70%. Hold Lasix at this time due to renal failure. Cardiology consult.  Patient have increased pulmonary artery pressure per echocardiogram. Due to holding Lasix on admission, patient has worsening pulmonary edema. Resume IV Lasix to help with diuresis and continue to monitor renal function now.  nephro switched to oral torsemide and it seems to be helping much.  * UTI Levaquin, follow urine culture. Culture grows enterococcus which is resistant to Levaquin, changed to amoxicillin per sensitivity. We may give Total 7 days treatment.  * Hyponatremia.  Follow-up BMP.    Improved.  * Acute on chronic anemia due to chronic renal disease stage III Awaited stool guaiac.   patient's hemoglobin had dropped less than 7 and she has received transfusion now hemoglobin is stable.  All the records are reviewed and case discussed with Care Management/Social Workerr. Management plans discussed with the patient, family and they are in agreement.  CODE STATUS: DNR.  TOTAL TIME TAKING CARE OF THIS PATIENT: 35 minutes.  Need to have a family meeting with palliative care to decide goals of care. Patient was suggested to go to SNF by physical therapy, sister also have concerned  that there is not enough help at home.  But as patient is completely alert and oriented now and she strongly wished to go home.  I encouraged her to discuss with palliative care and her family and come up with the plan to get up to home health at home. Also encouraged to get hospice involved in her care at home.  POSSIBLE D/C IN 1-2 DAYS, DEPENDING ON CLINICAL CONDITION.   Vaughan Basta M.D on 07/12/2018   Between 7am to 6pm - Pager - [phone]  After 6pm go to [email or web address] - password [email or web address] [facility]  Office  [phone]  CC: Primary care physician; Leonel Ramsay, MD  Note: This dictation was prepared with Dragon dictation along with smaller phrase technology. Any transcriptional errors that result from this process are unintentional.

## 2018-07-12 NOTE — Progress Notes (Signed)
Physical Therapy Treatment Patient Details Name: Carolyn Valdez MRN: [id number] DOB: Jun 18, 1941 Today's Date: 07/12/2018    History of Present Illness 77 yo Female admitted on 07/04/2018 with dx of hyperkalemia. she had syncope episode at home. She was found to have hypertension and tachycardia. Patient was admitted to to telemetry; PMH significant: Anxiety, CHF, DM, past RTKA, HTN, HLD, Recurrent UTI;     PT Comments    Pt currently refuses any out of bed activity. Agrees to bed exercises and is able to complete as instructed. Will continue to encourage mobility at follow-up sessions. Pt will benefit from PT services to address deficits in strength, balance, and mobility in order to return to full function at home.    Follow Up Recommendations  SNF     Equipment Recommendations  Rolling walker with 5" wheels    Recommendations for Other Services       Precautions / Restrictions Precautions Precautions: Fall Restrictions Weight Bearing Restrictions: No    Mobility  Bed Mobility               General bed mobility comments: Pt currently refuses any out of bed activity. Agrees to bed exercises  Transfers                    Ambulation/Gait                 Stairs             Wheelchair Mobility    Modified Rankin (Stroke Patients Only)       Balance                                            Cognition Arousal/Alertness: Awake/alert Behavior During Therapy: WFL for tasks assessed/performed Overall Cognitive Status: Within Functional Limits for tasks assessed                                        Exercises General Exercises - Lower Extremity Ankle Circles/Pumps: Both;10 reps Quad Sets: Both;10 reps Gluteal Sets: Both;10 reps Short Arc Quad: Both;10 reps Heel Slides: Both;10 reps Hip ABduction/ADduction: Both;10 reps Straight Leg Raises: Both;10 reps    General Comments        Pertinent  Vitals/Pain Pain Assessment: No/denies pain    Home Living                      Prior Function            PT Goals (current goals can now be found in the care plan section) Acute Rehab PT Goals Patient Stated Goal: to go home PT Goal Formulation: With patient Time For Goal Achievement: 07/22/18 Potential to Achieve Goals: Fair Progress towards PT goals: Not progressing toward goals - comment(Refuses out of bed mobility)    Frequency    Min 2X/week      PT Plan Current plan remains appropriate    Co-evaluation              AM-PAC PT "6 Clicks" Daily Activity  Outcome Measure  Difficulty turning over in bed (including adjusting bedclothes, sheets and blankets)?: A Lot Difficulty moving from lying on back to sitting on the side of the bed? : Unable Difficulty sitting down on and  standing up from a chair with arms (e.g., wheelchair, bedside commode, etc,.)?: Unable Help needed moving to and from a bed to chair (including a wheelchair)?: A Little Help needed walking in hospital room?: A Little Help needed climbing 3-5 steps with a railing? : A Lot 6 Click Score: 12    End of Session Equipment Utilized During Treatment: Gait belt Activity Tolerance: Patient limited by fatigue Patient left: in bed;with call bell/phone within reach;with bed alarm set;with family/visitor present   PT Visit Diagnosis: Unsteadiness on feet (R26.81);Muscle weakness (generalized) (M62.81);Other abnormalities of gait and mobility (R26.89);Difficulty in walking, not elsewhere classified (R26.2)     Time: 7282-0601 PT Time Calculation (min) (ACUTE ONLY): 11 min  Charges:  $Therapeutic Exercise: 8-22 mins                     Lyndel Safe Masyn Fullam PT, DPT, GCS    Lawana Hartzell 07/12/2018, 2:37 PM

## 2018-07-12 NOTE — Progress Notes (Signed)
Palliative Note:  Attempted to contact sister, [name] multiple times on yesterday as she requested to meet with myself and patient to discuss disposition needs and concerns. Sister did not come to hospital to meet either. I have made several attempts to reach out to her today as well. I have contacted patient's niece and informed [name] was at a doctor's appointment and should return call once she is available. I left a detailed message at her home and on her cell phone with a direct dial number to contact me for further discussion at patient's permission.   Will await a return phone call so that we can further discuss goals of care for Carolyn Valdez and proceed as planned.   Alda Lea, AGPCNP-BC Palliative Medicine Team  Phone: [phone] Fax: [phone] Pager: [phone] Amion: Delane Ginger. Cousar

## 2018-07-12 NOTE — Care Management Important Message (Signed)
Important Message  Patient Details  Name: ESTHELA BRANDNER MRN: [id number] Date of Birth: 1941/07/05   Medicare Important Message Given:  Yes    [name] A An Schnabel 07/12/2018, 11:14 AM

## 2018-07-12 NOTE — Progress Notes (Signed)
SUBJECTIVE: Pt resting comfortably.     Vitals:   07/11/18 1958 07/11/18 2300 07/12/18 0409 07/12/18 0530  BP: (!) 151/54 (!) 163/59 (!) 153/50   Pulse: 67 93 67   Resp: 17  17 (!) 25  Temp: 98.5 F (36.9 C)  98.1 F (36.7 C)   TempSrc: Oral  Oral   SpO2: 98%  97%   Weight:   117.2 kg   Height:        Intake/Output Summary (Last 24 hours) at 07/12/2018 0845 Last data filed at 07/12/2018 0215 Gross per 24 hour  Intake 360 ml  Output 3600 ml  Net -3240 ml    LABS: Basic Metabolic Panel: Recent Labs    07/11/18 0430 07/12/18 0358  NA 137 140  K 4.4 4.2  CL 101 100  CO2 31 32  GLUCOSE 277* 245*  BUN 45* 42*  CREATININE 2.23* 1.99*  CALCIUM 8.7* 8.9   Liver Function Tests: No results for input(s): AST, ALT, ALKPHOS, BILITOT, PROT, ALBUMIN in the last 72 hours. No results for input(s): LIPASE, AMYLASE in the last 72 hours. CBC: Recent Labs    07/10/18 0430 07/12/18 0358  WBC 6.0 5.1  HGB 8.3* 8.3*  HCT 24.7* 25.1*  MCV 89.7 89.1  PLT 222 219   Cardiac Enzymes: No results for input(s): CKTOTAL, CKMB, CKMBINDEX, TROPONINI in the last 72 hours. BNP: Invalid input(s): POCBNP D-Dimer: No results for input(s): DDIMER in the last 72 hours. Hemoglobin A1C: No results for input(s): HGBA1C in the last 72 hours. Fasting Lipid Panel: No results for input(s): CHOL, HDL, LDLCALC, TRIG, CHOLHDL, LDLDIRECT in the last 72 hours. Thyroid Function Tests: No results for input(s): TSH, T4TOTAL, T3FREE, THYROIDAB in the last 72 hours.  Invalid input(s): FREET3 Anemia Panel: No results for input(s): VITAMINB12, FOLATE, FERRITIN, TIBC, IRON, RETICCTPCT in the last 72 hours.   PHYSICAL EXAM General: Pale, ill appearing HEENT:  Normocephalic and atramatic Neck:  No JVD.  Lungs: Clear bilaterally to auscultation and percussion. Heart: HRRR . 3/6 systolic murmur Abdomen: Bowel sounds are positive, abdomen soft and non-tender  Msk:  Back normal, normal gait. Normal strength  and tone for age. Extremities: No clubbing, cyanosis or edema.   Neuro: Asleep, unable to wake    ASSESSMENT AND PLAN: Status post bradycardia secondary to renal injury and hyperkalemia. Currently NSR by auscultation, creatinine is back to baseline. Systolic BP high, but wide pulse pressures and requires 3L oxygen. Pulmonary pressures were moderate-severely elevated. Recommend palliative care.    Active Problems:   Hyperkalemia    Jake Bathe, NP-C 07/12/2018 8:45 AM Cell: [phone]

## 2018-07-12 NOTE — Progress Notes (Signed)
[facility]  ROUNDING NOTE   Subjective:   UOP 700 cc.   Creatinine 2.23->1.99 Good response to Oral torsemide Patient more alert today    Objective:  Vital signs in last 24 hours:  Temp:  [98.1 F (36.7 C)-99.2 F (37.3 C)] 98.1 F (36.7 C) (09/25 0922) Pulse Rate:  [66-93] 66 (09/25 0922) Resp:  [17-25] 24 (09/25 0922) BP: (147-163)/(50-60) 156/60 (09/25 0922) SpO2:  [97 %-98 %] 98 % (09/25 0922) Weight:  [117.2 kg] 117.2 kg (09/25 0409)  Weight change: -3.4 kg Filed Weights   07/10/18 0541 07/11/18 0500 07/12/18 0409  Weight: 120.8 kg 120.6 kg 117.2 kg    Intake/Output: I/O last 3 completed shifts: In: 360 [P.O.:360] Out: 6100 [Urine:6100]   Intake/Output this shift:  Total I/O In: 0  Out: 700 [Urine:700]  Physical Exam: General: Ill appearing  Head: Normocephalic, atraumatic.    Eyes: Anicteric,    Neck: Supple,    Lungs:  Decreased breath sounds at bases, O2 by [place] 3 L   Heart: irregular  Abdomen:  Soft, nontender, obese  Extremities: trace peripheral edema.  Neurologic: Alert and oriented  Skin: No lesions        Basic Metabolic Panel: Recent Labs  Lab 07/05/18 1514 07/06/18 0434  07/08/18 0413 07/09/18 0354 07/10/18 0430 07/11/18 0430 07/12/18 0358  NA 138 138   < > 139 137 136 137 140  K 4.2 4.7   < > 4.3 4.5 4.3 4.4 4.2  CL 105 104   < > 102 102 101 101 100  CO2 25 25   < > 27 30 31 31  32  GLUCOSE 170* 190*   < > 271* 287* 209* 277* 245*  BUN 52* 52*   < > 39* 44* 45* 45* 42*  CREATININE 3.15* 3.23*   < > 2.08* 2.30* 2.22* 2.23* 1.99*  CALCIUM 8.5* 8.4*   < > 9.2 8.8* 8.6* 8.7* 8.9  MG 2.2 2.3  --   --   --   --   --   --   PHOS 4.7* 4.2  --   --   --   --   --   --    < > = values in this interval not displayed.    Liver Function Tests: Recent Labs  Lab 07/05/18 1514 07/06/18 0434 07/06/18 1454  AST  --   --  83*  ALT  --   --  101*  ALKPHOS  --   --  78  BILITOT  --   --  0.6  PROT  --   --  5.7*  ALBUMIN  3.4* 3.1* 3.2*   No results for input(s): LIPASE, AMYLASE in the last 168 hours. Recent Labs  Lab 07/06/18 2000  AMMONIA 22    CBC: Recent Labs  Lab 07/06/18 0434 07/07/18 0443 07/08/18 0413 07/10/18 0430 07/12/18 0358  WBC 7.4 6.7 8.5 6.0 5.1  NEUTROABS 4.3  --   --   --   --   HGB 6.4* 8.3* 9.4* 8.3* 8.3*  HCT 19.1* 24.4* 27.9* 24.7* 25.1*  MCV 90.9 89.1 89.1 89.7 89.1  PLT 234 232 245 222 219    Cardiac Enzymes: No results for input(s): CKTOTAL, CKMB, CKMBINDEX, TROPONINI in the last 168 hours.  BNP: Invalid input(s): POCBNP  CBG: Recent Labs  Lab 07/11/18 1150 07/11/18 1633 07/11/18 2125 07/12/18 0749 07/12/18 0945  GLUCAP 257* 258* 152* 202* 192*    Microbiology: Results for orders placed or  performed during the hospital encounter of 07/04/18  MRSA PCR Screening     Status: None   Collection Time: 07/04/18 10:10 PM  Result Value Ref Range Status   MRSA by PCR NEGATIVE NEGATIVE Final    Comment:        The GeneXpert MRSA Assay (FDA approved for NASAL specimens only), is one component of a comprehensive MRSA colonization surveillance program. It is not intended to diagnose MRSA infection nor to guide or monitor treatment for MRSA infections. Performed at [facility], [address] [place], [place] [address]   Culture, Urine     Status: Abnormal   Collection Time: 07/05/18  5:13 AM  Result Value Ref Range Status   Specimen Description   Final    URINE, RANDOM Performed at [facility], [address] [place], [place] [address]    Special Requests   Final    NONE Performed at [facility], [place], [place] [address]    Culture 30,000 COLONIES/mL ENTEROCOCCUS FAECALIS (A)  Final   Report Status 07/07/2018 FINAL  Final   Organism ID, Bacteria ENTEROCOCCUS FAECALIS (A)  Final      Susceptibility   Enterococcus faecalis - MIC*    AMPICILLIN <=2 SENSITIVE Sensitive     LEVOFLOXACIN >=8 RESISTANT  Resistant     NITROFURANTOIN 64 INTERMEDIATE Intermediate     VANCOMYCIN 1 SENSITIVE Sensitive     * 30,000 COLONIES/mL ENTEROCOCCUS FAECALIS    Coagulation Studies: No results for input(s): LABPROT, INR in the last 72 hours.  Urinalysis: No results for input(s): COLORURINE, LABSPEC, PHURINE, GLUCOSEU, HGBUR, BILIRUBINUR, KETONESUR, PROTEINUR, UROBILINOGEN, NITRITE, LEUKOCYTESUR in the last 72 hours.  Invalid input(s): APPERANCEUR    Imaging: No results found.   Medications:   . sodium chloride     . acidophilus  1 capsule Oral QHS  . amoxicillin  500 mg Oral Q12H  . aspirin EC  81 mg Oral Daily  . citalopram  10 mg Oral Daily  . clonazePAM  0.25 mg Oral BID  . docusate sodium  100 mg Oral BID  . heparin injection (subcutaneous)  5,000 Units Subcutaneous Q8H  . hydrALAZINE  25 mg Oral Q8H  . insulin aspart  0-5 Units Subcutaneous QHS  . insulin aspart  0-9 Units Subcutaneous TID WC  . insulin glargine  10 Units Subcutaneous Daily  . insulin glargine  15 Units Subcutaneous QHS  . iron polysaccharides  150 mg Oral TID  . lamoTRIgine  25 mg Oral Daily  . levETIRAcetam  250 mg Oral BID  . loratadine  10 mg Oral Daily  . mouth rinse  15 mL Mouth Rinse BID  . Melatonin  5 mg Oral QHS  . metoprolol tartrate  25 mg Oral BID  . OLANZapine zydis  10 mg Oral QHS  . pantoprazole  40 mg Oral BID  . polyethylene glycol  17 g Oral Daily  . senna  1 tablet Oral Daily  . sodium chloride flush  3 mL Intravenous Q12H  . torsemide  40 mg Oral Daily   sodium chloride, acetaminophen **OR** acetaminophen, albuterol, bisacodyl, ondansetron **OR** ondansetron (ZOFRAN) IV, senna-docusate, sodium chloride flush  Assessment/ Plan:  [name] is a 77 y.o. white female with congestive heart failure, hypertension, diabetes mellitus type II, obstructive sleep apnea, nephrolithiasis who has been admitted to [facility] on 07/04/2018 for acute renal failure, hyperkalemia, metabolic acidosis requiring  renal replacement therapy. Taking potassium chloride PO at home.  2 D Echo 07/05/18: LVEF 24-17%, Grade 2 diastolic dysfunction, Systolic pressure was moderately to severely  increased. Pulmonary artery systolic pressure is 53-01 mmHg plus central venous/right atrial pressure.  1. 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  Secondary to acute cardiorenal syndrome. 2. Hyperkalemia 3. Metabolic Acidosis 4. Acute exacerbation of diastolic congestive failure 5. Normocytic Anemia 6. Hyperphosphatemia 7. Diabetes mellitus type II with chronic kidney disease: hemoglobin A1c 7.6% on 04/22/18  Plan Continue torsemide 40 mg daily AM  Palliative care discussion is in progress D/c outpatient potassium No ACE-I/ARB due to ARF and hyperkalemia.       LOS: 8 Gibson Telleria 9/25/201911:34 AM

## 2018-07-12 NOTE — Progress Notes (Addendum)
Inpatient Diabetes Program Recommendations  AACE/ADA: New Consensus Statement on Inpatient Glycemic Control (2019)  Target Ranges:  Prepandial:   less than 140 mg/dL      Peak postprandial:   less than 180 mg/dL (1-2 hours)      Critically ill patients:  140 - 180 mg/dL   Results for CLARIBEL, SACHS (MRN [id number]) as of 07/12/2018 10:28  Ref. Range 07/11/2018 07:42 07/11/2018 11:50 07/11/2018 16:33 07/11/2018 21:25 07/12/2018 07:49 07/12/2018 09:45  Glucose-Capillary Latest Ref Range: 70 - 99 mg/dL 241 (H) 257 (H) 258 (H) 152 (H) 202 (H) 192 (H)   Review of Glycemic Control  Diabetes history:DM2 Outpatient Diabetes medications:Lantus30-40units QHS when needed (if glucose is less than 100 mg/dl does not take), Metformin 1000 mg BID, Novolog listed onhome med listbut patient reports she is NOT taking Novolog Current orders for Inpatient glycemic control: Lantus 10 units QAM,Lantus 15 units QHS, Novolog 0-9 units TID with meals,Novolog 0-5 units QHS  Inpatient Diabetes Program Recommendations: Insulin - Basal: Please consider increasing morning dose of Lantus to 12 units QAM and bedtime dose to Lantus 16units QHS.  Thanks, Barnie Alderman, RN, MSN, CDE Diabetes Coordinator Inpatient Diabetes Program [phone] (Team Pager from 8am to 5pm)

## 2018-07-13 LAB — GLUCOSE, CAPILLARY
GLUCOSE-CAPILLARY: 175 mg/dL — AB (ref 70–99)
GLUCOSE-CAPILLARY: 210 mg/dL — AB (ref 70–99)
Glucose-Capillary: 238 mg/dL — ABNORMAL HIGH (ref 70–99)
Glucose-Capillary: 234 mg/dL — ABNORMAL HIGH (ref 70–99)

## 2018-07-13 NOTE — Progress Notes (Signed)
[facility]  ROUNDING NOTE   Subjective:   UOP 1700 mL. Creatinine 1.99, improving Good response to Oral torsemide Patient is able to eat without nausea or vomiting  Objective:  Vital signs in last 24 hours:  Temp:  [98.2 F (36.8 C)-98.8 F (37.1 C)] 98.8 F (37.1 C) (09/26 0924) Pulse Rate:  [65-72] 67 (09/26 0924) Resp:  [16-18] 18 (09/26 0352) BP: (150-161)/(49-64) 161/64 (09/26 0924) SpO2:  [98 %-99 %] 99 % (09/26 0924) Weight:  [114.8 kg] 114.8 kg (09/26 0100)  Weight change: -2.4 kg Filed Weights   07/11/18 0500 07/12/18 0409 07/13/18 0100  Weight: 120.6 kg 117.2 kg 114.8 kg    Intake/Output: I/O last 3 completed shifts: In: 240 [P.O.:240] Out: 2800 [Urine:2800]   Intake/Output this shift:  No intake/output data recorded.  Physical Exam: General: Ill appearing  Head: Normocephalic, atraumatic.    Eyes: Anicteric,    Neck: Supple,    Lungs:  mild basilar crackles, improved exam, [place] 3 L   Heart: irregular  Abdomen:  Soft, nontender, obese  Extremities: trace peripheral edema.  Neurologic: Alert and oriented  Skin: No lesions   External catheter in place    Basic Metabolic Panel: Recent Labs  Lab 07/08/18 0413 07/09/18 0354 07/10/18 0430 07/11/18 0430 07/12/18 0358  NA 139 137 136 137 140  K 4.3 4.5 4.3 4.4 4.2  CL 102 102 101 101 100  CO2 27 30 31 31  32  GLUCOSE 271* 287* 209* 277* 245*  BUN 39* 44* 45* 45* 42*  CREATININE 2.08* 2.30* 2.22* 2.23* 1.99*  CALCIUM 9.2 8.8* 8.6* 8.7* 8.9    Liver Function Tests: Recent Labs  Lab 07/06/18 1454  AST 83*  ALT 101*  ALKPHOS 78  BILITOT 0.6  PROT 5.7*  ALBUMIN 3.2*   No results for input(s): LIPASE, AMYLASE in the last 168 hours. Recent Labs  Lab 07/06/18 2000  AMMONIA 22    CBC: Recent Labs  Lab 07/07/18 0443 07/08/18 0413 07/10/18 0430 07/12/18 0358  WBC 6.7 8.5 6.0 5.1  HGB 8.3* 9.4* 8.3* 8.3*  HCT 24.4* 27.9* 24.7* 25.1*  MCV 89.1 89.1 89.7 89.1  PLT 232  245 222 219    Cardiac Enzymes: No results for input(s): CKTOTAL, CKMB, CKMBINDEX, TROPONINI in the last 168 hours.  BNP: Invalid input(s): POCBNP  CBG: Recent Labs  Lab 07/12/18 0945 07/12/18 1151 07/12/18 1628 07/12/18 2117 07/13/18 0739  GLUCAP 192* 161* 241* 209* 175*    Microbiology: Results for orders placed or performed during the hospital encounter of 07/04/18  MRSA PCR Screening     Status: None   Collection Time: 07/04/18 10:10 PM  Result Value Ref Range Status   MRSA by PCR NEGATIVE NEGATIVE Final    Comment:        The GeneXpert MRSA Assay (FDA approved for NASAL specimens only), is one component of a comprehensive MRSA colonization surveillance program. It is not intended to diagnose MRSA infection nor to guide or monitor treatment for MRSA infections. Performed at [facility], [address] [place], [place] [address]   Culture, Urine     Status: Abnormal   Collection Time: 07/05/18  5:13 AM  Result Value Ref Range Status   Specimen Description   Final    URINE, RANDOM Performed at [facility], [address] [place], [place] [address]    Special Requests   Final    NONE Performed at [facility], [place],  [place] [address]    Culture 30,000 COLONIES/mL ENTEROCOCCUS FAECALIS (A)  Final   Report Status 07/07/2018 FINAL  Final   Organism ID, Bacteria ENTEROCOCCUS FAECALIS (A)  Final      Susceptibility   Enterococcus faecalis - MIC*    AMPICILLIN <=2 SENSITIVE Sensitive     LEVOFLOXACIN >=8 RESISTANT Resistant     NITROFURANTOIN 64 INTERMEDIATE Intermediate     VANCOMYCIN 1 SENSITIVE Sensitive     * 30,000 COLONIES/mL ENTEROCOCCUS FAECALIS    Coagulation Studies: No results for input(s): LABPROT, INR in the last 72 hours.  Urinalysis: No results for input(s): COLORURINE, LABSPEC, PHURINE, GLUCOSEU, HGBUR, BILIRUBINUR, KETONESUR, PROTEINUR, UROBILINOGEN, NITRITE, LEUKOCYTESUR in the last 72  hours.  Invalid input(s): APPERANCEUR    Imaging: No results found.   Medications:   . sodium chloride     . acidophilus  1 capsule Oral QHS  . amoxicillin  500 mg Oral Q12H  . aspirin EC  81 mg Oral Daily  . citalopram  10 mg Oral Daily  . clonazePAM  0.25 mg Oral BID  . docusate sodium  100 mg Oral BID  . heparin injection (subcutaneous)  5,000 Units Subcutaneous Q8H  . hydrALAZINE  25 mg Oral Q8H  . insulin aspart  0-5 Units Subcutaneous QHS  . insulin aspart  0-9 Units Subcutaneous TID WC  . insulin glargine  10 Units Subcutaneous Daily  . insulin glargine  15 Units Subcutaneous QHS  . iron polysaccharides  150 mg Oral TID  . lamoTRIgine  25 mg Oral Daily  . levETIRAcetam  250 mg Oral BID  . loratadine  10 mg Oral Daily  . mouth rinse  15 mL Mouth Rinse BID  . Melatonin  5 mg Oral QHS  . metoprolol tartrate  25 mg Oral BID  . OLANZapine zydis  10 mg Oral QHS  . pantoprazole  40 mg Oral BID  . polyethylene glycol  17 g Oral Daily  . senna  1 tablet Oral Daily  . sodium chloride flush  3 mL Intravenous Q12H  . torsemide  40 mg Oral Daily   sodium chloride, acetaminophen **OR** acetaminophen, albuterol, bisacodyl, ondansetron **OR** ondansetron (ZOFRAN) IV, senna-docusate, sodium chloride flush  Assessment/ Plan:  [name] is a 77 y.o. white female with congestive heart failure, hypertension, diabetes mellitus type II, obstructive sleep apnea, nephrolithiasis who has been admitted to [facility] on 07/04/2018 for acute renal failure, hyperkalemia, metabolic acidosis requiring renal replacement therapy. Taking potassium chloride PO at home.  2 D Echo 07/05/18: LVEF 79-89%, Grade 2 diastolic dysfunction, Systolic pressure was moderately to severely   increased pulmonary artery systolic pressure is 21-19 mmHg.  1. 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  Secondary to acute cardiorenal  syndrome. 2. Hyperkalemia 3. Metabolic Acidosis 4. Acute exacerbation of diastolic congestive failure 5. Normocytic Anemia 6. Hyperphosphatemia 7. Diabetes mellitus type II with chronic kidney disease: hemoglobin A1c 7.6% on 04/22/18  Plan Continue torsemide 40 mg daily AM  Family considering hospice D/c outpatient potassium No ACE-I/ARB due to ARF, hyperkalemia. Can be considered in future Follow up outpatient   LOS: 9 Milbern Doescher 9/26/201911:14 AM

## 2018-07-13 NOTE — Progress Notes (Signed)
[place] [facility] [place] NAME: Carolyn Valdez    MR#:  [id number]  DATE OF BIRTH:  Feb 25, 1941  SUBJECTIVE:  CHIEF COMPLAINT:   Chief Complaint  Patient presents with  . Weakness   Came with feeling weak, noted bradycardia- High K. Had CRRT- K came down, HR stable now. Feels weak still. Feels fine and really want to go home to sister's care.  T  REVIEW OF SYSTEMS:    Review of Systems  Constitutional: Positive for malaise/fatigue. Negative for fever and weight loss.  HENT: Negative for congestion, ear discharge and sore throat.   Eyes: Negative for blurred vision and discharge.  Respiratory: Positive for shortness of breath. Negative for sputum production.   Cardiovascular: Positive for leg swelling. Negative for chest pain, palpitations and orthopnea.  Gastrointestinal: Negative for abdominal pain, diarrhea, nausea and vomiting.  Genitourinary: Negative for dysuria and frequency.  Musculoskeletal: Negative for joint pain and myalgias.  Skin: Negative for rash.  Neurological: Negative for dizziness, focal weakness, seizures and headaches.  Psychiatric/Behavioral: Negative for depression.    DRUG ALLERGIES:   Allergies  Allergen Reactions  . Sulfa Antibiotics Hives  . Biaxin [Clarithromycin] Hives  . Influenza A (H1n1) Monoval Vac Other (See Comments)    Pt states that she was told by her MD not to get the influenza vaccine.    . Morphine Other (See Comments)    Reaction:  Dizziness and confusion   . Pyridium [Phenazopyridine Hcl] Other (See Comments)    Reaction:  Unknown   . Ceftriaxone Anxiety  . Latex Rash  . Prednisone Rash  . Tape Rash    VITALS:  Blood pressure (!) 161/64, pulse 67, temperature 98.8 F (37.1 C), temperature source Oral, resp. rate 18, height 5\' 7"  (1.702 m), weight 114.8 kg, SpO2 99 %.  PHYSICAL EXAMINATION:  GENERAL:  77 y.o.-year-old patient lying in the bed with no acute distress.  EYES: Pupils equal,  round, reactive to light and accommodation. No scleral icterus. Extraocular muscles intact.  HEENT: Head atraumatic, normocephalic. Oropharynx and nasopharynx clear.  NECK:  Supple, no jugular venous distention. No thyroid enlargement, no tenderness.  LUNGS: Normal breath sounds bilaterally, no wheezing, some crepitation. No use of accessory muscles of respiration.  On supplemental oxygen CARDIOVASCULAR: S1, S2 normal. No murmurs, rubs, or gallops.  ABDOMEN: Soft, nontender, nondistended. Bowel sounds present. No organomegaly or mass.  EXTREMITIES: b/l pedal edema,no cyanosis, or clubbing. NEUROLOGIC: Pt is alert, have generalized weakness, sensations intact.  Follows simple command.   PSYCHIATRIC: The patient is alert and oriented.  SKIN: No obvious rash, lesion, or ulcer.   Physical Exam LABORATORY PANEL:   CBC Recent Labs  Lab 07/12/18 0358  WBC 5.1  HGB 8.3*  HCT 25.1*  PLT 219   ------------------------------------------------------------------------------------------------------------------  Chemistries  Recent Labs  Lab 07/06/18 1454  07/12/18 0358  NA  --    < > 140  K  --    < > 4.2  CL  --    < > 100  CO2  --    < > 32  GLUCOSE  --    < > 245*  BUN  --    < > 42*  CREATININE  --    < > 1.99*  CALCIUM  --    < > 8.9  AST 83*  --   --   ALT 101*  --   --   ALKPHOS 78  --   --  BILITOT 0.6  --   --    < > = values in this interval not displayed.   ------------------------------------------------------------------------------------------------------------------  Cardiac Enzymes No results for input(s): TROPONINI in the last 168 hours. ------------------------------------------------------------------------------------------------------------------  RADIOLOGY:  No results found.  ASSESSMENT AND PLAN:   Active Problems:   Hyperkalemia   * Hyperkalemia with bradycardia. Monitored in stepdown unit.  treated with calcium gluconate, bicarb, NovoLog and  D50.   CRRT started by Nephro- helped. Potassium level and renal function improved now.  Dialysis catheter removed.  * Acute renal failure on CKD. Due to cardiorenal syncrome   Due to dehydration Held Lasix on admission, improving after CRRT, cont to monitor. Improving.  torsemide seems to be helping better with diuresis.   * Altered mental status Likely due to UTI, also due to renal failure Vitals are stable and blood sugar is stable. Started on treatment for UTI. Much improved.  * Acute respiratory failure with hypoxia due to acute on chronic diastolic CHF ejection fraction 70%. Hold Lasix at this time due to renal failure. Cardiology consult.  Patient have increased pulmonary artery pressure per echocardiogram.  nephro switched to oral torsemide and it seems to be helping much.  * UTI Levaquin, follow urine culture. Culture grows enterococcus which is resistant to Levaquin, changed to amoxicillin per sensitivity. give Total 7 days treatment.  * Hyponatremia. Follow-up BMP.    Improved.  * Acute on chronic anemia due to chronic renal disease stage III Awaited stool guaiac.   patient's hemoglobin had dropped less than 7 and she has received transfusion now hemoglobin is stable  PT recommends rehab but pt really wants to go home .seen by palliative care recommending home  with hospice care . Will d c home with hospice tomorrow  Nicholes Mango M.D on 07/13/2018   Between 7am to 6pm - Pager - [phone]  After 6pm go to [email or web address] - password [email or web address] [facility]  Office  [phone]  CC: Primary care physician; Leonel Ramsay, MD  Note: This dictation was prepared with Dragon dictation along with smaller phrase technology. Any transcriptional errors that result from this process are unintentional.

## 2018-07-13 NOTE — Progress Notes (Signed)
New referral for [facility] services at home received from [facility]. Patient is a 77 year old woman with a known history of CHF, diabetes, hypertension, hyperlipidemia, breast cancer, DM II, SVTrecurrent UTI's. Admitted to [facility] on 9/17 with complaints of leg swelling and weakness. She was found to be hypoxic with bradycardia. Since admission she has been followed by cardiology and nephrology, she required CRRT in the ICU. Palliative Medicine was consulted for goals of care and have had multiple conversations with patient and her sister Carolyn Valdez. They have chosen for patient to return home with the support of hospice serves and addition care provided by [facility]. Patient has requested a bedside commode, and oxygen has also been ordered. Writer spoke with both patient and her sister Carolyn Valdez, both voiced understanding of plan for discharge home tomorrow. After DME is in place. Patient will require EMS transport with signed DNR in place. Patient information faxed to referral. Will continue to follow through final discharge. Hospital care team updated. Flo Shanks RN, BSN, [place] [facility], hospital Liaison [phone]

## 2018-07-13 NOTE — Discharge Summary (Signed)
[facility] NAME: Carolyn Valdez    MR#:  [id number]  DATE OF BIRTH:  [birth year]  DATE OF ADMISSION:  07/04/2018 ADMITTING PHYSICIAN: Demetrios Loll, MD  DATE OF DISCHARGE:  07/14/18  PRIMARY CARE PHYSICIAN: Leonel Ramsay, MD    ADMISSION DIAGNOSIS:  Acute renal failure (ARF) ([place]) [N17.9] Hyperkalemia [E87.5]  DISCHARGE DIAGNOSIS:  Active Problems:   Hyperkalemia AKI  ftt  SECONDARY DIAGNOSIS:   Past Medical History:  Diagnosis Date  . Anxiety   . Breast cancer ([place]) 1997   right breast mastectomy  . CHF (congestive heart failure) ([place])   . Diabetes mellitus without complication ([place])   . DJD (degenerative joint disease)   . Dysuria   . H/O total knee replacement    right  . HTN (hypertension)   . Hypercholesteremia   . Kidney stone   . Obesity   . Recurrent urinary tract infection   . SVT (supraventricular tachycardia) ([place])   . Urinary incontinence   . Vaginal atrophy   . Yeast vaginitis     HOSPITAL COURSE:   HPI Carolyn Valdez  is a 77 y.o. female with a known history of CHF, diabetes, hypertension, hyperlipidemia, breast cancer, recurrent UTI and incontinence etc.  The patient presents the ED with above chief complaint.  She has been feeling very weak for the past few days.  In addition she complains of shortness of breath on exertion and leg edema.  She is found hypoxia in the ED and put on oxygen by nasal cannula.  She also has bradycardia at a 30 to 40s in the ED.  Labs come back show high potassium 7.3 and renal failure.  She is being treated with cocktail of medications for hyperkalemia now.  * Hyperkalemia with bradycardia. Monitored in stepdown unit.  treated with calcium gluconate, bicarb, NovoLog and D50.   CRRT started by Nephro- helped. Potassium level and renal function improved now.  Dialysis catheter removed.  * Acute renal failure on CKD. Due to cardiorenal syncrome   Due to  dehydration Held Lasix on admission, improving after CRRT, cont to monitor.  torsemide seems to be helping better with diuresis.   * Altered mental status Likely due to UTI, also due to renal failure Vitals are stable and blood sugar is stable. Started on treatment for UTI. Much improved.  * Acute respiratory failure with hypoxia due to acute on chronic diastolic CHF ejection fraction 70%. Hold Lasix at this time due to renal failure. Cardiology consult.  Patient have increased pulmonary artery pressure per echocardiogram.  nephro switched to oral torsemide and it seems to be helping much.  * UTI Levaquin, follow urine culture. Culture grows enterococcus which is resistant to Levaquin, changed to amoxicillin per sensitivity. Patient has completed total 7 days treatment with amoxicillin during the hospital course.  * Hyponatremia. Follow-up BMP.    Improved.  * Acute on chronic anemia due to chronic renal disease stage III Awaited stool guaiac.   patient's hemoglobin had dropped less than 7 and she has received transfusion now hemoglobin is stable  PT recommends rehab but pt really wants to go home .seen by palliative care recommending home  with hospice care . Will d c home with hospice We will arrange home oxygen and bedside commode  DISCHARGE CONDITIONS:   fair  CONSULTS OBTAINED:  Treatment Team:  Dionisio David, MD   PROCEDURES  None   DRUG ALLERGIES:   Allergies  Allergen Reactions  . Sulfa Antibiotics Hives  . Biaxin [Clarithromycin] Hives  . Influenza A (H1n1) Monoval Vac Other (See Comments)    Pt states that she was told by her MD not to get the influenza vaccine.    . Morphine Other (See Comments)    Reaction:  Dizziness and confusion   . Pyridium [Phenazopyridine Hcl] Other (See Comments)    Reaction:  Unknown   . Ceftriaxone Anxiety  . Latex Rash  . Prednisone Rash  . Tape Rash    DISCHARGE MEDICATIONS:   Allergies as of 07/14/2018       Reactions   Sulfa Antibiotics Hives   Biaxin [clarithromycin] Hives   Influenza A (h1n1) Monoval Vac Other (See Comments)   Pt states that she was told by her MD not to get the influenza vaccine.     Morphine Other (See Comments)   Reaction:  Dizziness and confusion    Pyridium [phenazopyridine Hcl] Other (See Comments)   Reaction:  Unknown    Ceftriaxone Anxiety   Latex Rash   Prednisone Rash   Tape Rash      Medication List    STOP taking these medications   clonazePAM 0.5 MG tablet Commonly known as:  KLONOPIN Replaced by:  clonazePAM 0.25 MG disintegrating tablet   furosemide 40 MG tablet Commonly known as:  LASIX   insulin aspart 100 UNIT/ML injection Commonly known as:  novoLOG   ipratropium-albuterol 0.5-2.5 (3) MG/3ML Soln Commonly known as:  DUONEB   metFORMIN 500 MG tablet Commonly known as:  GLUCOPHAGE   potassium chloride SA 20 MEQ tablet Commonly known as:  K-DUR,KLOR-CON   senna 8.6 MG Tabs tablet Commonly known as:  SENOKOT     TAKE these medications   acetaminophen 325 MG tablet Commonly known as:  TYLENOL Take 2 tablets (650 mg total) by mouth every 6 (six) hours as needed for mild pain (or Fever >/= 101).   acidophilus Caps capsule Take 1 capsule by mouth at bedtime.   albuterol 108 (90 Base) MCG/ACT inhaler Commonly known as:  PROVENTIL HFA;VENTOLIN HFA Inhale 2 puffs into the lungs every 6 (six) hours as needed for wheezing or shortness of breath.   aspirin 81 MG EC tablet Take 1 tablet (81 mg total) by mouth daily.   citalopram 10 MG tablet Commonly known as:  CELEXA Take 1 tablet (10 mg total) by mouth daily.   clonazePAM 0.25 MG disintegrating tablet Commonly known as:  KLONOPIN Take 1 tablet (0.25 mg total) by mouth 2 (two) times daily. Replaces:  clonazePAM 0.5 MG tablet   docusate sodium 100 MG capsule Commonly known as:  COLACE Take 100 mg by mouth 2 (two) times daily.   feeding supplement (ENSURE ENLIVE) Liqd Take 237  mLs by mouth 3 (three) times daily between meals.   fluticasone 50 MCG/ACT nasal spray Commonly known as:  FLONASE Place 2 sprays into both nostrils daily.   hydrALAZINE 50 MG tablet Commonly known as:  APRESOLINE Take 1 tablet (50 mg total) by mouth every 8 (eight) hours.   insulin glargine 100 UNIT/ML injection Commonly known as:  LANTUS Inject 0.15 mLs (15 Units total) into the skin at bedtime. What changed:  how much to take   insulin glargine 100 UNIT/ML injection Commonly known as:  LANTUS Inject 0.1 mLs (10 Units total) into the skin daily. What changed:  You were already taking a medication with the same name, and this prescription was added. Make sure you understand how  and when to take each.   iron polysaccharides 150 MG capsule Commonly known as:  NIFEREX Take 150 mg by mouth 3 (three) times daily.   lamoTRIgine 25 MG tablet Commonly known as:  LAMICTAL Take 1 tablet (25 mg total) by mouth daily.   levETIRAcetam 250 MG tablet Commonly known as:  KEPPRA Take 1 tablet (250 mg total) by mouth 2 (two) times daily.   loratadine 10 MG tablet Commonly known as:  CLARITIN Take 10 mg by mouth daily.   Melatonin 3 MG Tabs Take 6 mg by mouth at bedtime.   metoprolol tartrate 25 MG tablet Commonly known as:  LOPRESSOR Take 1 tablet (25 mg total) by mouth 2 (two) times daily. What changed:    medication strength  how much to take   nitrofurantoin 100 MG capsule Commonly known as:  MACRODANTIN Take 1 capsule (100 mg total) by mouth daily.   OLANZapine zydis 10 MG disintegrating tablet Commonly known as:  ZYPREXA Take 1 tablet (10 mg total) by mouth at bedtime.   pantoprazole 40 MG tablet Commonly known as:  PROTONIX Take 40 mg by mouth 2 (two) times daily.   polyethylene glycol packet Commonly known as:  MIRALAX / GLYCOLAX Take 17 g by mouth daily.   Potassium Chloride ER 20 MEQ Tbcr Take 20 mEq by mouth daily.   senna-docusate 8.6-50 MG  tablet Commonly known as:  Senokot-S Take 1 tablet by mouth at bedtime as needed for mild constipation.   tolterodine 4 MG 24 hr capsule Commonly known as:  DETROL LA Take 4 mg by mouth daily.   torsemide 20 MG tablet Commonly known as:  DEMADEX Take 2 tablets (40 mg total) by mouth daily.        DISCHARGE INSTRUCTIONS:  Follow-up with primary care physician in 3 to 5 days Home with hospice care Follow-up with nephrology in 1 to 2 weeks  DIET:  Cardiac diet  DISCHARGE CONDITION:  Fair  ACTIVITY:  Activity as tolerated  OXYGEN:  Home Oxygen: Yes.     Oxygen Delivery: 2 liters/min via Patient connected to nasal cannula oxygen  DISCHARGE LOCATION:  home with hospice  If you experience worsening of your admission symptoms, develop shortness of breath, life threatening emergency, suicidal or homicidal thoughts you must seek medical attention immediately by calling 911 or calling your MD immediately  if symptoms less severe.  You Must read complete instructions/literature along with all the possible adverse reactions/side effects for all the Medicines you take and that have been prescribed to you. Take any new Medicines after you have completely understood and accpet all the possible adverse reactions/side effects.   Please note  You were cared for by a hospitalist during your hospital stay. If you have any questions about your discharge medications or the care you received while you were in the hospital after you are discharged, you can call the unit and asked to speak with the hospitalist on call if the hospitalist that took care of you is not available. Once you are discharged, your primary care physician will handle any further medical issues. Please note that NO REFILLS for any discharge medications will be authorized once you are discharged, as it is imperative that you return to your primary care physician (or establish a relationship with a primary care physician if you  do not have one) for your aftercare needs so that they can reassess your need for medications and monitor your lab values.     Today  Chief Complaint  Patient presents with  . Weakness   Patient is feeling much better today awake and alert and taking her breakfast and wants to go home with hospice care  ROS:  CONSTITUTIONAL: Denies fevers, chills.  Reports weakness.  EYES: Denies blurry vision, double vision, eye pain. EARS, NOSE, THROAT: Denies tinnitus, ear pain, hearing loss. RESPIRATORY: Denies cough, wheeze, shortness of breath.  CARDIOVASCULAR: Denies chest pain, palpitations, edema.  GASTROINTESTINAL: Denies nausea, vomiting, diarrhea, abdominal pain. Denies bright red blood per rectum. GENITOURINARY: Denies dysuria, hematuria. ENDOCRINE: Denies nocturia or thyroid problems. HEMATOLOGIC AND LYMPHATIC: Denies easy bruising or bleeding. SKIN: Denies rash or lesion. MUSCULOSKELETAL: Denies pain in neck, back, shoulder, knees, hips or arthritic symptoms.  NEUROLOGIC: Denies paralysis, paresthesias.  PSYCHIATRIC: Denies anxiety or depressive symptoms.   VITAL SIGNS:  Blood pressure (!) 174/60, pulse 60, temperature 98.1 F (36.7 C), temperature source Oral, resp. rate 17, height 5\' 7"  (1.702 m), weight 116.3 kg, SpO2 95 %.  I/O:   No intake or output data in the 24 hours ending 07/28/18 1206  PHYSICAL EXAMINATION:  GENERAL:  77 y.o.-year-old patient lying in the bed with no acute distress.  EYES: Pupils equal, round, reactive to light and accommodation. No scleral icterus. Extraocular muscles intact.  HEENT: Head atraumatic, normocephalic. Oropharynx and nasopharynx clear.  NECK:  Supple, no jugular venous distention. No thyroid enlargement, no tenderness.  LUNGS: Normal breath sounds bilaterally, no wheezing, rales,rhonchi or crepitation. No use of accessory muscles of respiration.  CARDIOVASCULAR: S1, S2 normal. No murmurs, rubs, or gallops.  ABDOMEN: Soft, non-tender,  non-distended. Bowel sounds present. No organomegaly or mass.  EXTREMITIES: No pedal edema, cyanosis, or clubbing.  NEUROLOGIC: Cranial nerves II through XII are intact. Muscle strength global weakness in all extremities. Sensation intact. Gait not checked, patient mostly is bedbound PSYCHIATRIC: The patient is alert and oriented x 3.  SKIN: No obvious rash, lesion, or ulcer.   DATA REVIEW:   CBC No results for input(s): WBC, HGB, HCT, PLT in the last 168 hours.  Chemistries  No results for input(s): NA, K, CL, CO2, GLUCOSE, BUN, CREATININE, CALCIUM, MG, AST, ALT, ALKPHOS, BILITOT in the last 168 hours.  Invalid input(s): GFRCGP  Cardiac Enzymes No results for input(s): TROPONINI in the last 168 hours.  Microbiology Results  Results for orders placed or performed during the hospital encounter of 07/04/18  MRSA PCR Screening     Status: None   Collection Time: 07/04/18 10:10 PM  Result Value Ref Range Status   MRSA by PCR NEGATIVE NEGATIVE Final    Comment:        The GeneXpert MRSA Assay (FDA approved for NASAL specimens only), is one component of a comprehensive MRSA colonization surveillance program. It is not intended to diagnose MRSA infection nor to guide or monitor treatment for MRSA infections. Performed at [facility], [address] [place], [place] [address]   Culture, Urine     Status: Abnormal   Collection Time: 07/05/18  5:13 AM  Result Value Ref Range Status   Specimen Description   Final    URINE, RANDOM Performed at [facility], [address], [place], [place] [address]    Special Requests   Final    NONE Performed at [facility], [place], [place] [address]    Culture 30,000 COLONIES/mL ENTEROCOCCUS FAECALIS (A)  Final   Report Status 07/07/2018 FINAL  Final   Organism ID, Bacteria ENTEROCOCCUS FAECALIS (A)  Final  Susceptibility   Enterococcus faecalis - MIC*    AMPICILLIN <=2 SENSITIVE  Sensitive     LEVOFLOXACIN >=8 RESISTANT Resistant     NITROFURANTOIN 64 INTERMEDIATE Intermediate     VANCOMYCIN 1 SENSITIVE Sensitive     * 30,000 COLONIES/mL ENTEROCOCCUS FAECALIS    RADIOLOGY:  No results found.  EKG:   Orders placed or performed during the hospital encounter of 07/04/18  . EKG 12-Lead  . EKG 12-Lead  . ED EKG  . ED EKG  . EKG      Management plans discussed with the patient, family and they are in agreement.  CODE STATUS:     Code Status Orders  (From admission, onward)         Start     Ordered   07/06/18 1407  Do not attempt resuscitation (DNR)  Continuous    Question Answer Comment  In the event of cardiac or respiratory ARREST Do not call a "code blue"   In the event of cardiac or respiratory ARREST Do not perform Intubation, CPR, defibrillation or ACLS   In the event of cardiac or respiratory ARREST Use medication by any route, position, wound care, and other measures to relive pain and suffering. May use oxygen, suction and manual treatment of airway obstruction as needed for comfort.   Comments discussed with POA      07/06/18 1407        Code Status History    Date Active Date Inactive Code Status Order ID Comments User Context   07/04/2018 2200 07/06/2018 1407 Full Code [id number]  Demetrios Loll, MD Inpatient   04/22/2018 1702 04/26/2018 1851 DNR [id number]  Max Sane, MD Inpatient   02/21/2017 1629 02/24/2017 1747 DNR [id number]  Fritzi Mandes, MD Inpatient   01/31/2017 1617 02/04/2017 1350 DNR [id number]  Max Sane, MD Inpatient   01/23/2017 1320 01/31/2017 1617 Full Code [id number]  Henreitta Leber, MD Inpatient   10/05/2016 1216 10/26/2016 1740 DNR [id number]  Knox Royalty, NP Inpatient   09/24/2016 1731 10/05/2016 1216 Full Code [id number]  Gladstone Lighter, MD Inpatient   09/01/2016 0217 09/05/2016 1628 Full Code [id number]  Hugelmeyer, [place], DO Inpatient   08/09/2016 2027 08/12/2016 2015 Full Code [id number]  Henreitta Leber, MD Inpatient    09/30/2015 1423 10/07/2015 1737 Full Code [id number]  Loletha Grayer, MD ED   09/05/2015 1756 09/09/2015 1738 DNR [id number]  Loletha Grayer, MD ED      TOTAL TIME TAKING CARE OF THIS PATIENT: 43  minutes.   Note: This dictation was prepared with Dragon dictation along with smaller phrase technology. Any transcriptional errors that result from this process are unintentional.   @[facility] @  on 07/28/2018 at 12:06 PM  Between 7am to 6pm - Pager - [phone]  After 6pm go to [email or web address] - password [email or web address] [facility]  [facility]  Office  [phone]  CC: Primary care physician; Leonel Ramsay, MD

## 2018-07-13 NOTE — Progress Notes (Signed)
Daily Progress Note   Patient Name: Carolyn Valdez       Date: 07/13/2018 DOB: Jun 26, 1941  Age: 77 y.o. MRN#: [id number] Attending Physician: Nicholes Mango, MD Primary Care Physician: Leonel Ramsay, MD Admit Date: 07/04/2018  Reason for Consultation/Follow-up: Establishing goals of care  Subjective: Patient lying in bed. A&O x3. She denies shortness of breath or pain. States she continues to feel weak.Continues to tolerated torsemide without difficulties. Good diuresis. Patient expresses she is ready to go back home.   I had a lengthy discussion with patient and her sister/POA [place].  We discussed plans for patient at discharge with consideration to 24/7 caregiver needs, equipment needs, goals of care, and safety.  Patient expressed her wishes to return back to her own home.  She is adamant that she does not want to be placed in any type of facility for rehab or long-term care.  Priscilla verbalizes her wishes to support patient and states that she has arranged for patient to have 24/7 care from [facility] care service.  She states that she has made all arrangements and will contact company with potential discharge date.  Sister was advised the patient is medically stable for discharge based on attending provider and could be discharged as early as tomorrow.  Sister verbalized understanding and states she will get in contact with company first thing tomorrow morning.  She also states that patient had a friend/caregiver that was caring for her in the home prior to admission for several hours a day and also doing errands and other housework.  She will also continue to come in and assist patient in the home.  Patient verbalizes agreement with arrangements sister has made.  Sister also states that  patient [facility] and is on several occasions this company has capability of delivery medications if needed.  We discussed any potential home needs such as hospital bed.  Patient assisted feels at this time she does not require a hospital bed.  Patient does request for a bedside commode if possible. Advised that she has also received continuous oxygen support since admission and this would most likely continue at discharge.   I discussed in great details patient's current illness and comorbidities with her and her sister.  Both verbalized understanding and acceptance of  patient illnesses.  After further discussion and education on palliative and hospice services patient and sister verbalized their decision for patient to return home with hospice services.  Patient and sister is aware that with hospice services care is focused on comfort without use of aggressive measures such as physical therapy, IV fluids, and some medications not focused on comfort or symptom management.  Patient and sister verbalized understanding.  We also discussed that life expectancy with hospice services is 6 months or less.  They also verbalized understanding. They are aware they will be contacted by case management and Santiago Glad, RN (hospice liaison) for hospice set-up and additional review of home needs.   Chart Reviewed and report received from bedside RN.   Length of Stay: 9  Current Medications: Scheduled Meds:  . acidophilus  1 capsule Oral QHS  . amoxicillin  500 mg Oral Q12H  . aspirin EC  81 mg Oral Daily  . citalopram  10 mg Oral Daily  . clonazePAM  0.25 mg Oral BID  . docusate sodium  100 mg Oral BID  . heparin injection (subcutaneous)  5,000 Units Subcutaneous Q8H  . hydrALAZINE  25 mg Oral Q8H  . insulin aspart  0-5 Units Subcutaneous QHS  . insulin aspart  0-9 Units Subcutaneous TID WC  . insulin glargine  10 Units Subcutaneous Daily  . insulin glargine  15 Units  Subcutaneous QHS  . iron polysaccharides  150 mg Oral TID  . lamoTRIgine  25 mg Oral Daily  . levETIRAcetam  250 mg Oral BID  . loratadine  10 mg Oral Daily  . mouth rinse  15 mL Mouth Rinse BID  . Melatonin  5 mg Oral QHS  . metoprolol tartrate  25 mg Oral BID  . OLANZapine zydis  10 mg Oral QHS  . pantoprazole  40 mg Oral BID  . polyethylene glycol  17 g Oral Daily  . senna  1 tablet Oral Daily  . sodium chloride flush  3 mL Intravenous Q12H  . torsemide  40 mg Oral Daily    Continuous Infusions: . sodium chloride      PRN Meds: sodium chloride, acetaminophen **OR** acetaminophen, albuterol, bisacodyl, ondansetron **OR** ondansetron (ZOFRAN) IV, senna-docusate, sodium chloride flush  Physical Exam  Constitutional: She is oriented to person, place, and time. She appears well-developed. She is cooperative. She appears ill.  Cardiovascular: Normal rate, regular rhythm, normal heart sounds and normal pulses.  Pulmonary/Chest: Effort normal. She has decreased breath sounds.  Musculoskeletal:  Generalized weakness   Neurological: She is alert and oriented to person, place, and time.  Psychiatric: Judgment normal. Cognition and memory are normal.  Nursing note and vitals reviewed.           Vital Signs: BP (!) 150/53 (BP Location: Left Arm)   Pulse 65   Temp 98.5 F (36.9 C) (Oral)   Resp 18   Ht 5\' 7"  (1.702 m)   Wt 114.8 kg   SpO2 98%   BMI 39.64 kg/m  SpO2: SpO2: 98 % O2 Device: O2 Device: Nasal Cannula O2 Flow Rate: O2 Flow Rate (L/min): 3 L/min  Intake/output summary:   Intake/Output Summary (Last 24 hours) at 07/13/2018 0916 Last data filed at 07/13/2018 0600 Gross per 24 hour  Intake 240 ml  Output 1700 ml  Net -1460 ml   LBM: Last BM Date: 07/11/18 Baseline Weight: Weight: 121.9 kg Most recent weight: Weight: 114.8 kg      Palliative Assessment/Data: PPS 30%   Flowsheet  Rows     Most Recent Value  Intake Tab  Referral Department  Hospitalist  Unit  at Time of Referral  Med/Surg Unit  Palliative Care Primary Diagnosis  Cardiac  Date Notified  07/06/18  Palliative Care Type  Return patient Palliative Care  Reason for referral  Clarify Goals of Care  Date of Admission  07/04/18  Date first seen by Palliative Care  07/06/18  # of days Palliative referral response time  0 Day(s)  # of days IP prior to Palliative referral  2  Clinical Assessment  Psychosocial & Spiritual Assessment  Palliative Care Outcomes      Patient Active Problem List   Diagnosis Date Noted  . Hyperkalemia 07/04/2018  . Atrial fibrillation with RVR ([place]) 04/23/2018  . Syncope 04/22/2018  . Acute respiratory failure with hypoxia and hypercapnia ([place]) 02/21/2017  . Adjustment disorder with anxiety 01/28/2017  . Acute on chronic respiratory failure with hypoxia ([place]) 01/23/2017  . Palliative care encounter   . Goals of care, counseling/discussion   . DNR (do not resuscitate) 10/05/2016  . Palliative care by specialist 10/05/2016  . Depression, major, recurrent, severe with psychosis ([place]) 10/05/2016  . Severe major depression, single episode, with psychotic features ([place]) 10/04/2016  . Cellulitis of leg, right 09/29/2016  . Proctitis 09/29/2016  . Hypercapnia 09/29/2016  . Acute urinary retention 09/29/2016  . UTI (urinary tract infection) 09/29/2016  . Weakness 09/29/2016  . Subacute delirium 09/28/2016  . Gastrointestinal hemorrhage   . Diffuse abdominal pain   . Altered mental status 09/24/2016  . Pressure injury of skin 09/02/2016  . Respiratory failure with hypoxia ([place]) 09/01/2016  . Lymphedema 08/16/2016  . Acute on chronic congestive heart failure ([place]) 08/09/2016  . Acquired lymphedema of leg 04/21/2016  . Congestive heart failure ([place]) 11/25/2015  . Nocturia 11/05/2015  . Urinary frequency 11/05/2015  . Acute respiratory failure with hypoxia ([place]) 09/05/2015  . 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 Incontinence 04/20/2015  . Diabetes mellitus, type  2 ([place]) 04/17/2015  . ESBL (extended spectrum beta-lactamase) producing bacteria infection 04/17/2015  . BP (high blood pressure) 04/17/2015  . Frequent UTI 04/17/2015  . Absence of bladder continence 04/17/2015  . Iron deficiency anemia 03/08/2015  . Absolute anemia 09/25/2014  . Abdominal pain, lower 09/25/2014  . Urge incontinence 02/19/2013  . FOM (frequency of micturition) 02/19/2013  . Bladder infection, chronic 08/11/2012  . Difficult or painful urination 07/26/2012  . Lower urinary tract infection 12/31/2011  . Diabetes mellitus ([place]) 12/31/2011    Palliative Care Assessment & Plan   Patient Profile: 77 y.o.femaleadmitted on 9/17/2019from home withcomplaints of weakness and leg swelling. Patient has a past medical history significant for diabetes, hyperlipidemia, hypertension, CHF, breast cancer (right breast mastectomy 1997), urinary incontinence, SVT, obesity, right total knee replacement, and anxiety. During her ED course patient complained of shortness of breath, feeling weak for several days prior to admission, and bilateral leg edema. She was found to be hypoxic and placed on nasal cannula. Patient was also found to be bradycardic with heart rates in the 30s/40s. Her potassium was 7.3. CO2 13. BUN 75. Creatinine 5.11. Lipase 63. BNP 3147. Hemoglobin 8.0. Troponin <0.03.Patient was treated with calcium gluconate, bicarb, NovoLog, and D50. Since admission she has been followed by cardiology and nephrology. Patient also received CRRT.Palliative medicine team consulted for goals of care discussion.  Recommendations/Plan:  DNR/DNI-as documented, also discussed with patient who verbalized her agreement  Continue to treat the treatable while hospitalized.  Patient and  sister, [name] expressed goal is for patient to return home with 24/7 home care ([facility]) and family support. Hospice outpatient also.   CM consult for outpatient hospice services  with [facility]  Palliative medicine team will continue to support patient, patient's family, and medical team.   Goals of Care and Additional Recommendations:  Limitations on Scope of Treatment: Full Scope Treatment  Code Status:    Code Status Orders  (From admission, onward)         Start     Ordered   07/06/18 1407  Do not attempt resuscitation (DNR)  Continuous    Question Answer Comment  In the event of cardiac or respiratory ARREST Do not call a "code blue"   In the event of cardiac or respiratory ARREST Do not perform Intubation, CPR, defibrillation or ACLS   In the event of cardiac or respiratory ARREST Use medication by any route, position, wound care, and other measures to relive pain and suffering. May use oxygen, suction and manual treatment of airway obstruction as needed for comfort.   Comments discussed with POA      07/06/18 1407        Code Status History    Date Active Date Inactive Code Status Order ID Comments User Context   07/04/2018 2200 07/06/2018 1407 Full Code [id number]  Demetrios Loll, MD Inpatient   04/22/2018 1702 04/26/2018 1851 DNR [id number]  Max Sane, MD Inpatient   02/21/2017 1629 02/24/2017 1747 DNR [id number]  Fritzi Mandes, MD Inpatient   01/31/2017 1617 02/04/2017 1350 DNR [id number]  Max Sane, MD Inpatient   01/23/2017 1320 01/31/2017 1617 Full Code [id number]  Henreitta Leber, MD Inpatient   10/05/2016 1216 10/26/2016 1740 DNR [id number]  Knox Royalty, NP Inpatient   09/24/2016 1731 10/05/2016 1216 Full Code [id number]  Gladstone Lighter, MD Inpatient   09/01/2016 0217 09/05/2016 1628 Full Code [id number]  Hugelmeyer, [place], DO Inpatient   08/09/2016 2027 08/12/2016 2015 Full Code [id number]  Henreitta Leber, MD Inpatient   09/30/2015 1423 10/07/2015 1737 Full Code [id number]  Loletha Grayer, MD ED   09/05/2015 1756 09/09/2015 1738 DNR [id number]  Loletha Grayer, MD ED       Prognosis:   < 6 months guarded to poor in the  setting of hyperkalemia, hypertension, bradycardia, diabetes, CHF, dehydration, CKD , acute respiratory failure with hypoxia, metabolic acidosis, and hyponatremia.  Discharge Planning:  Home with Hospice  Care plan was discussed with patient, patient's family, bedside RN ([place]).   Thank you for allowing the Palliative Medicine Team to assist in the care of this patient.   Time In: 1800 Time Out: 1905 Total Time 65 min.  Prolonged Time Billed  YES        Greater than 50%  of this time was spent counseling and coordinating care related to the above assessment and plan.  Alda Lea, AGPCNP-BC Palliative Medicine Team  Phone: [phone] Fax: [phone] Pager: [phone] Amion: Bjorn Pippin   Please contact Palliative Medicine Team phone at [phone] for questions and concerns.

## 2018-07-13 NOTE — Progress Notes (Addendum)
Daily Progress Note   Patient Name: ROSEMAE MCQUOWN       Date: 07/13/2018 DOB: [birth year]  Age: 77 y.o. MRN#: [id number] Attending Physician: Nicholes Mango, MD Primary Care Physician: Leonel Ramsay, MD Admit Date: 07/04/2018  Reason for Consultation/Follow-up: Establishing goals of care  Subjective: Patient in bed. Awake and A&O x3. Patient denies pain. Denies shortness of breath. Continues to feel weak. Remains on 3L/[place]. Patient states she is happy she is going to be able to go home.    Briefly reviewed goals of care discussion with patient and with [facility] on the line. Both verbalized plan is for patient to return home with 24/7 private home care, family and friend care/support, and outpatient hospice support. Patient and family requesting bedside commode for home. Reports they feel that is the only equipment need. Patient remains on 3L/[place] and will most likely require oxygen in the home. Patient apparently has oxygen in home which she wasn't using prior to admission. Sister reports that she has contacted the home agency ([facility]) and they have arranged to be at patient's home around 12pm tomorrow. Sister concerned that patient will be discharge prior to this and also she is now sick with pneumonia and is unavailable today to be with patient and want to keep her distance until she feels better or have been on antibiotics for 24 hrs. Patient verbalized understanding of plan of care at discharge and the arranged home support. Offered patient opportunity to have hospital bed for home and that I can notify Santiago Glad, Therapist, sports (hospice liaison) for arrangements. Patient and sister continue to decline and patient states she has a lift chair that she paid a lot of money for and is comfortable. Educated  patient on the potential of incontinence or being bedbound in future months requiring the need for a hospital bed. Patient and sister verbalized understanding and are also aware that at anytime they feel a bed is necessary they can discuss with their outpatient hospice team for arrangements.   Chart reviewed and report received from bedside RN.   Length of Stay: 9  Current Medications: Scheduled Meds:  . acidophilus  1 capsule Oral QHS  . amoxicillin  500 mg Oral Q12H  . aspirin EC  81 mg Oral Daily  . citalopram  10 mg Oral Daily  .  clonazePAM  0.25 mg Oral BID  . docusate sodium  100 mg Oral BID  . heparin injection (subcutaneous)  5,000 Units Subcutaneous Q8H  . hydrALAZINE  25 mg Oral Q8H  . insulin aspart  0-5 Units Subcutaneous QHS  . insulin aspart  0-9 Units Subcutaneous TID WC  . insulin glargine  10 Units Subcutaneous Daily  . insulin glargine  15 Units Subcutaneous QHS  . iron polysaccharides  150 mg Oral TID  . lamoTRIgine  25 mg Oral Daily  . levETIRAcetam  250 mg Oral BID  . loratadine  10 mg Oral Daily  . mouth rinse  15 mL Mouth Rinse BID  . Melatonin  5 mg Oral QHS  . metoprolol tartrate  25 mg Oral BID  . OLANZapine zydis  10 mg Oral QHS  . pantoprazole  40 mg Oral BID  . polyethylene glycol  17 g Oral Daily  . senna  1 tablet Oral Daily  . sodium chloride flush  3 mL Intravenous Q12H  . torsemide  40 mg Oral Daily    Continuous Infusions: . sodium chloride      PRN Meds: sodium chloride, acetaminophen **OR** acetaminophen, albuterol, bisacodyl, ondansetron **OR** ondansetron (ZOFRAN) IV, senna-docusate, sodium chloride flush  Physical Exam  Constitutional: She is oriented to person, place, and time. Vital signs are normal. She appears well-developed. She is cooperative. She appears ill.  Cardiovascular: Normal rate, regular rhythm, normal heart sounds and normal pulses.  Bilateral trace edema  Pulmonary/Chest: Effort normal. She has decreased breath  sounds.  Musculoskeletal:  Generalized weakness   Neurological: She is alert and oriented to person, place, and time.  Skin: Skin is warm and dry.  Psychiatric: Judgment normal. Cognition and memory are normal.  Nursing note and vitals reviewed.           Vital Signs: BP (!) 161/64   Pulse 67   Temp 98.8 F (37.1 C) (Oral)   Resp 18   Ht 5\' 7"  (1.702 m)   Wt 114.8 kg   SpO2 99%   BMI 39.64 kg/m  SpO2: SpO2: 99 % O2 Device: O2 Device: Nasal Cannula O2 Flow Rate: O2 Flow Rate (L/min): 3 L/min  Intake/output summary:   Intake/Output Summary (Last 24 hours) at 07/13/2018 1617 Last data filed at 07/13/2018 1300 Gross per 24 hour  Intake 240 ml  Output 1600 ml  Net -1360 ml   LBM: Last BM Date: 07/11/18 Baseline Weight: Weight: 121.9 kg Most recent weight: Weight: 114.8 kg      Palliative Assessment/Data: PPS 30%   Flowsheet Rows     Most Recent Value  Intake Tab  Referral Department  Hospitalist  Unit at Time of Referral  Med/Surg Unit  Palliative Care Primary Diagnosis  Cardiac  Date Notified  07/06/18  Palliative Care Type  Return patient Palliative Care  Reason for referral  Clarify Goals of Care  Date of Admission  07/04/18  Date first seen by Palliative Care  07/06/18  # of days Palliative referral response time  0 Day(s)  # of days IP prior to Palliative referral  2  Clinical Assessment  Psychosocial & Spiritual Assessment  Palliative Care Outcomes      Patient Active Problem List   Diagnosis Date Noted  . Hyperkalemia 07/04/2018  . Atrial fibrillation with RVR ([place]) 04/23/2018  . Syncope 04/22/2018  . Acute respiratory failure with hypoxia and hypercapnia ([place]) 02/21/2017  . Adjustment disorder with anxiety 01/28/2017  . Acute on chronic respiratory  failure with hypoxia ([place]) 01/23/2017  . Palliative care encounter   . Goals of care, counseling/discussion   . DNR (do not resuscitate) 10/05/2016  . Palliative care by specialist 10/05/2016  .  Depression, major, recurrent, severe with psychosis ([place]) 10/05/2016  . Severe major depression, single episode, with psychotic features ([place]) 10/04/2016  . Cellulitis of leg, right 09/29/2016  . Proctitis 09/29/2016  . Hypercapnia 09/29/2016  . Acute urinary retention 09/29/2016  . UTI (urinary tract infection) 09/29/2016  . Weakness 09/29/2016  . Subacute delirium 09/28/2016  . Gastrointestinal hemorrhage   . Diffuse abdominal pain   . Altered mental status 09/24/2016  . Pressure injury of skin 09/02/2016  . Respiratory failure with hypoxia ([place]) 09/01/2016  . Lymphedema 08/16/2016  . Acute on chronic congestive heart failure ([place]) 08/09/2016  . Acquired lymphedema of leg 04/21/2016  . Congestive heart failure ([place]) 11/25/2015  . Nocturia 11/05/2015  . Urinary frequency 11/05/2015  . Acute respiratory failure with hypoxia ([place]) 09/05/2015  . 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 Incontinence 04/20/2015  . Diabetes mellitus, type 2 ([place]) 04/17/2015  . ESBL (extended spectrum beta-lactamase) producing bacteria infection 04/17/2015  . BP (high blood pressure) 04/17/2015  . Frequent UTI 04/17/2015  . Absence of bladder continence 04/17/2015  . Iron deficiency anemia 03/08/2015  . Absolute anemia 09/25/2014  . Abdominal pain, lower 09/25/2014  . Urge incontinence 02/19/2013  . FOM (frequency of micturition) 02/19/2013  . Bladder infection, chronic 08/11/2012  . Difficult or painful urination 07/26/2012  . Lower urinary tract infection 12/31/2011  . Diabetes mellitus ([employer]) 12/31/2011    Palliative Care Assessment & Plan   Patient Profile: 77 y.o.femaleadmitted on 9/17/2019from home withcomplaints of weakness and leg swelling. Patient has a past medical history significant for diabetes, hyperlipidemia, hypertension, CHF, breast cancer (right breast mastectomy 1997), urinary incontinence, SVT, obesity, right total knee replacement, and anxiety. During her ED course patient complained  of shortness of breath, feeling weak for several days prior to admission, and bilateral leg edema. She was found to be hypoxic and placed on nasal cannula. Patient was also found to be bradycardic with heart rates in the 30s/40s. Her potassium was 7.3. CO2 13. BUN 75. Creatinine 5.11. Lipase 63. BNP 3147. Hemoglobin 8.0. Troponin <0.03.Patient was treated with calcium gluconate, bicarb, NovoLog, and D50. Since admission she has been followed by cardiology and nephrology. Patient also received CRRT.Palliative medicine team consulted for goals of care discussion.   Recommendations/Plan:  DNR/DNI-as documented, also discussed with patient who verbalized her agreement. Out of facility DNR from completed to be given at discharge.   Continue to treat the treatable while hospitalized.  Patient and sister, [name] expressed goal is for patient to return home with 24/7 home care ([place]) and family support. Hospice outpatient also. Sister confirms that she has spoke with agency and they are expected to be at patient's home on 9/27 around 12pm to begin care with patient.   CM consult for outpatient hospice services with potential equipment needs (BSC/oxygen). Santiago Glad, RN (hospice liaison) and Hassan Rowan, CM aware. Patient may require ems transport home.   Palliative medicine team will continue to support patient, patient's family, and medical team.   Goals of Care and Additional Recommendations:  Limitations on Scope of Treatment: Full Scope Treatment  Code Status:    Code Status Orders  (From admission, onward)         Start     Ordered   07/06/18 1407  Do not attempt resuscitation (DNR)  Continuous    Question Answer Comment  In the event of cardiac or respiratory ARREST Do not call a "code blue"   In the event of cardiac or respiratory ARREST Do not perform Intubation, CPR, defibrillation or ACLS   In the event of cardiac or respiratory ARREST Use medication by any route,  position, wound care, and other measures to relive pain and suffering. May use oxygen, suction and manual treatment of airway obstruction as needed for comfort.   Comments discussed with POA      07/06/18 1407        Code Status History    Date Active Date Inactive Code Status Order ID Comments User Context   07/04/2018 2200 07/06/2018 1407 Full Code [id number]  Demetrios Loll, MD Inpatient   04/22/2018 1702 04/26/2018 1851 DNR [id number]  Max Sane, MD Inpatient   02/21/2017 1629 02/24/2017 1747 DNR [id number]  Fritzi Mandes, MD Inpatient   01/31/2017 1617 02/04/2017 1350 DNR [id number]  Max Sane, MD Inpatient   01/23/2017 1320 01/31/2017 1617 Full Code [id number]  Henreitta Leber, MD Inpatient   10/05/2016 1216 10/26/2016 1740 DNR [id number]  Knox Royalty, NP Inpatient   09/24/2016 1731 10/05/2016 1216 Full Code [id number]  Gladstone Lighter, MD Inpatient   09/01/2016 0217 09/05/2016 1628 Full Code [id number]  Hugelmeyer, [place], DO Inpatient   08/09/2016 2027 08/12/2016 2015 Full Code [id number]  Henreitta Leber, MD Inpatient   09/30/2015 1423 10/07/2015 1737 Full Code [id number]  Loletha Grayer, MD ED   09/05/2015 1756 09/09/2015 1738 DNR [id number]  Loletha Grayer, MD ED     Prognosis:   < 6 months-guarded to poor in the setting of hyperkalemia, hypertension, bradycardia, diabetes, CHF, dehydration, CKD, acute respiratory failure with hypoxia, metabolic acidosis, and hyponatremia.  Discharge Planning:  Home with Hospice  Care plan was discussed with patient, patient's family, bedside RN, Hassan Rowan, [place], and Santiago Glad, RN [facility]).   Thank you for allowing the Palliative Medicine Team to assist in the care of this patient.   Total Time 45 min Prolonged Time Billed  NO       Greater than 50%  of this time was spent counseling and coordinating care related to the above assessment and plan.  Alda Lea, AGPCNP-BC Palliative Medicine Team  Phone: [phone] Fax:  [phone] Pager: [phone] Amion: Bjorn Pippin   Please contact Palliative Medicine Team phone at [phone] for questions and concerns.

## 2018-07-14 LAB — GLUCOSE, CAPILLARY
Glucose-Capillary: 195 mg/dL — ABNORMAL HIGH (ref 70–99)
Glucose-Capillary: 260 mg/dL — ABNORMAL HIGH (ref 70–99)

## 2018-07-14 MED ORDER — INSULIN GLARGINE 100 UNIT/ML ~~LOC~~ SOLN: 15.0000 [IU] | Freq: Every day | SUBCUTANEOUS | 1 refills | Status: DC

## 2018-07-14 MED ORDER — CLONAZEPAM 0.25 MG PO TBDP: 0.2500 mg | ORAL_TABLET | Freq: Two times a day (BID) | ORAL | 0 refills | Status: DC

## 2018-07-14 MED ORDER — SENNOSIDES-DOCUSATE SODIUM 8.6-50 MG PO TABS: 1.0000 | ORAL_TABLET | Freq: Every evening | ORAL | Status: DC | PRN

## 2018-07-14 MED ORDER — TORSEMIDE 20 MG PO TABS: 40.0000 mg | ORAL_TABLET | Freq: Every day | ORAL | 0 refills | Status: AC

## 2018-07-14 MED ORDER — INSULIN GLARGINE 100 UNIT/ML ~~LOC~~ SOLN: 10.0000 [IU] | Freq: Every day | SUBCUTANEOUS | 0 refills | Status: DC

## 2018-07-14 MED ORDER — METOPROLOL TARTRATE 25 MG PO TABS: 25.0000 mg | ORAL_TABLET | Freq: Two times a day (BID) | ORAL | 0 refills | Status: DC

## 2018-07-14 MED ORDER — ALBUTEROL SULFATE HFA 108 (90 BASE) MCG/ACT IN AERS: 2.0000 | INHALATION_SPRAY | Freq: Four times a day (QID) | RESPIRATORY_TRACT | 1 refills | Status: AC | PRN

## 2018-07-14 MED ORDER — POTASSIUM CHLORIDE ER 20 MEQ PO TBCR: 20.0000 meq | EXTENDED_RELEASE_TABLET | Freq: Every day | ORAL | 0 refills | Status: DC

## 2018-07-14 NOTE — Progress Notes (Signed)
Follow up visit made to new referral for [facility] of [place] [facility] services at home. Patient seen sitting up in bed, eating breakfast, alert and oriented. Please to be going home. Remains on 2 liters of oxygen, she remains hopeful that she will not need it at home. Writer spoke with patient's sister [name] [name]) she reports all DME has been delivered, staff RN Gerald Stabs made aware. EMS to be notified for transport.REferral made aware. Flo Shanks RN, BSN, [facility], hospital Liaison [phone]

## 2018-07-14 NOTE — Progress Notes (Signed)
Received MD order to discharge patient to home with Hospice,  Reviewed homes meds, prescriptions, follow up appointments and discharge instructions  with patient and patient verbalized understanding

## 2018-07-14 NOTE — Progress Notes (Signed)
SUBJECTIVE: doing well   Vitals:   07/13/18 1941 07/14/18 0450 07/14/18 0500 07/14/18 0850  BP: (!) 153/50 (!) 153/54  (!) 164/59  Pulse: 67 61  63  Resp: 18 18  17   Temp: 98.9 F (37.2 C) 98 F (36.7 C)  98.5 F (36.9 C)  TempSrc: Oral Oral  Oral  SpO2: 98% 98%  97%  Weight:   116.3 kg   Height:        Intake/Output Summary (Last 24 hours) at 07/14/2018 1033 Last data filed at 07/14/2018 0402 Gross per 24 hour  Intake 360 ml  Output 1600 ml  Net -1240 ml    LABS: Basic Metabolic Panel: Recent Labs    07/12/18 0358  NA 140  K 4.2  CL 100  CO2 32  GLUCOSE 245*  BUN 42*  CREATININE 1.99*  CALCIUM 8.9   Liver Function Tests: No results for input(s): AST, ALT, ALKPHOS, BILITOT, PROT, ALBUMIN in the last 72 hours. No results for input(s): LIPASE, AMYLASE in the last 72 hours. CBC: Recent Labs    07/12/18 0358  WBC 5.1  HGB 8.3*  HCT 25.1*  MCV 89.1  PLT 219   Cardiac Enzymes: No results for input(s): CKTOTAL, CKMB, CKMBINDEX, TROPONINI in the last 72 hours. BNP: Invalid input(s): POCBNP D-Dimer: No results for input(s): DDIMER in the last 72 hours. Hemoglobin A1C: No results for input(s): HGBA1C in the last 72 hours. Fasting Lipid Panel: No results for input(s): CHOL, HDL, LDLCALC, TRIG, CHOLHDL, LDLDIRECT in the last 72 hours. Thyroid Function Tests: No results for input(s): TSH, T4TOTAL, T3FREE, THYROIDAB in the last 72 hours.  Invalid input(s): FREET3 Anemia Panel: No results for input(s): VITAMINB12, FOLATE, FERRITIN, TIBC, IRON, RETICCTPCT in the last 72 hours.   PHYSICAL EXAM General: Well developed, well nourished, in no acute distress HEENT:  Normocephalic and atramatic Neck:  No JVD.  Lungs: Clear bilaterally to auscultation and percussion. Heart: HRRR . Normal S1 and S2 without gallops or murmurs.  Abdomen: Bowel sounds are positive, abdomen soft and non-tender  Msk:  Back normal, normal gait. Normal strength and tone for  age. Extremities: No clubbing, cyanosis or edema.   Neuro: Alert and oriented X 3. Psych:  Good affect, responds appropriately  TELEMETRY: NSR  ASSESSMENT AND PLAN: Status post bradycardia secondary to renal injury and hyperkalemia. Currently NSR by auscultation, creatinine is back to baseline. Systolic BP high, but wide pulse pressures and requires 3L oxygen. Pulmonary pressures were moderate-severely elevated. Recommend palliative care.    Active Problems:   Hyperkalemia    Dionisio David, MD, [facility] 07/14/2018 10:33 AM

## 2018-07-14 NOTE — Care Management (Signed)
Discharge to home today with [facility] following. Transportation will be arranged per [place] RN MSN CCM Care Management [phone]

## 2018-07-14 NOTE — Discharge Instructions (Signed)
Follow-up with primary care physician in 3 to 5 days Home with hospice care Follow-up with nephrology in 1 to 2 weeks

## 2018-07-14 NOTE — Progress Notes (Signed)
Pt refused lunch time insulin

## 2018-08-03 ENCOUNTER — Encounter: Payer: Self-pay | Admitting: Emergency Medicine

## 2018-08-03 ENCOUNTER — Inpatient Hospital Stay
Admission: EM | Admit: 2018-08-03 | Discharge: 2018-08-07 | DRG: 377 | Disposition: A | Discharge status: Home Health | Payer: Medicare Other | Attending: Internal Medicine | Admitting: Internal Medicine

## 2018-08-03 ENCOUNTER — Emergency Department: Payer: Medicare Other

## 2018-08-03 ENCOUNTER — Other Ambulatory Visit: Payer: Self-pay

## 2018-08-03 DIAGNOSIS — G4733 Obstructive sleep apnea (adult) (pediatric): Secondary | ICD-10-CM | POA: Diagnosis present

## 2018-08-03 DIAGNOSIS — K3189 Other diseases of stomach and duodenum: Secondary | ICD-10-CM

## 2018-08-03 DIAGNOSIS — I130 Hypertensive heart and chronic kidney disease with heart failure and stage 1 through stage 4 chronic kidney disease, or unspecified chronic kidney disease: Secondary | ICD-10-CM | POA: Diagnosis present

## 2018-08-03 DIAGNOSIS — K921 Melena: Secondary | ICD-10-CM | POA: Diagnosis not present

## 2018-08-03 DIAGNOSIS — Z7951 Long term (current) use of inhaled steroids: Secondary | ICD-10-CM

## 2018-08-03 DIAGNOSIS — Z853 Personal history of malignant neoplasm of breast: Secondary | ICD-10-CM

## 2018-08-03 DIAGNOSIS — E7800 Pure hypercholesterolemia, unspecified: Secondary | ICD-10-CM | POA: Diagnosis present

## 2018-08-03 DIAGNOSIS — K31811 Angiodysplasia of stomach and duodenum with bleeding: Principal | ICD-10-CM | POA: Diagnosis present

## 2018-08-03 DIAGNOSIS — E669 Obesity, unspecified: Secondary | ICD-10-CM | POA: Diagnosis present

## 2018-08-03 DIAGNOSIS — N183 Chronic kidney disease, stage 3 unspecified: Secondary | ICD-10-CM | POA: Diagnosis present

## 2018-08-03 DIAGNOSIS — I509 Heart failure, unspecified: Secondary | ICD-10-CM

## 2018-08-03 DIAGNOSIS — Z9981 Dependence on supplemental oxygen: Secondary | ICD-10-CM

## 2018-08-03 DIAGNOSIS — F419 Anxiety disorder, unspecified: Secondary | ICD-10-CM | POA: Diagnosis present

## 2018-08-03 DIAGNOSIS — N184 Chronic kidney disease, stage 4 (severe): Secondary | ICD-10-CM | POA: Diagnosis present

## 2018-08-03 DIAGNOSIS — I10 Essential (primary) hypertension: Secondary | ICD-10-CM

## 2018-08-03 DIAGNOSIS — D631 Anemia in chronic kidney disease: Secondary | ICD-10-CM | POA: Diagnosis present

## 2018-08-03 DIAGNOSIS — Z6841 Body Mass Index (BMI) 40.0 and over, adult: Secondary | ICD-10-CM

## 2018-08-03 DIAGNOSIS — E1122 Type 2 diabetes mellitus with diabetic chronic kidney disease: Secondary | ICD-10-CM | POA: Diagnosis present

## 2018-08-03 DIAGNOSIS — E785 Hyperlipidemia, unspecified: Secondary | ICD-10-CM | POA: Diagnosis present

## 2018-08-03 DIAGNOSIS — J9621 Acute and chronic respiratory failure with hypoxia: Secondary | ICD-10-CM | POA: Diagnosis present

## 2018-08-03 DIAGNOSIS — D62 Acute posthemorrhagic anemia: Secondary | ICD-10-CM | POA: Diagnosis present

## 2018-08-03 DIAGNOSIS — Z79899 Other long term (current) drug therapy: Secondary | ICD-10-CM

## 2018-08-03 DIAGNOSIS — Z7982 Long term (current) use of aspirin: Secondary | ICD-10-CM

## 2018-08-03 DIAGNOSIS — Z66 Do not resuscitate: Secondary | ICD-10-CM | POA: Diagnosis present

## 2018-08-03 DIAGNOSIS — K31819 Angiodysplasia of stomach and duodenum without bleeding: Secondary | ICD-10-CM

## 2018-08-03 DIAGNOSIS — Z794 Long term (current) use of insulin: Secondary | ICD-10-CM

## 2018-08-03 DIAGNOSIS — N390 Urinary tract infection, site not specified: Secondary | ICD-10-CM | POA: Diagnosis present

## 2018-08-03 DIAGNOSIS — E872 Acidosis: Secondary | ICD-10-CM | POA: Diagnosis present

## 2018-08-03 DIAGNOSIS — K2901 Acute gastritis with bleeding: Secondary | ICD-10-CM

## 2018-08-03 DIAGNOSIS — D649 Anemia, unspecified: Secondary | ICD-10-CM | POA: Diagnosis present

## 2018-08-03 DIAGNOSIS — K922 Gastrointestinal hemorrhage, unspecified: Secondary | ICD-10-CM

## 2018-08-03 DIAGNOSIS — I5033 Acute on chronic diastolic (congestive) heart failure: Secondary | ICD-10-CM | POA: Diagnosis present

## 2018-08-03 DIAGNOSIS — D509 Iron deficiency anemia, unspecified: Secondary | ICD-10-CM | POA: Diagnosis present

## 2018-08-03 DIAGNOSIS — Z9011 Acquired absence of right breast and nipple: Secondary | ICD-10-CM

## 2018-08-03 DIAGNOSIS — Z87891 Personal history of nicotine dependence: Secondary | ICD-10-CM

## 2018-08-03 DIAGNOSIS — E119 Type 2 diabetes mellitus without complications: Secondary | ICD-10-CM

## 2018-08-03 DIAGNOSIS — Z96651 Presence of right artificial knee joint: Secondary | ICD-10-CM | POA: Diagnosis present

## 2018-08-03 DIAGNOSIS — N179 Acute kidney failure, unspecified: Secondary | ICD-10-CM | POA: Diagnosis present

## 2018-08-03 DIAGNOSIS — E875 Hyperkalemia: Secondary | ICD-10-CM | POA: Diagnosis present

## 2018-08-03 NOTE — ED Provider Notes (Signed)
[facility] Emergency Department Provider Note  ____________________________________________   First MD Initiated Contact with Patient 08/03/18 2323     (approximate)  I have reviewed the triage vital signs and the nursing notes.   HISTORY  Chief Complaint No chief complaint on file.   HPI Carolyn Valdez is a 77 y.o. female who comes to the emergency department via EMS with difficulty getting out of bed.  EMS was initially called for an assist however when they arrived they noted the patient was pale appearing and hypoxic to 80% on room air so she agreed to be transported to the hospital.  The patient has a complex past medical history including a remote history of right-sided breast cancer, congestive heart failure, diabetes mellitus, and a presumed recent GI bleed.  She was recently begun on home oxygen.  She feels generally weak and tired.  Her symptoms have been slowly progressive for the past week or so are now severe.  Worse with exertion improved with rest.  She is in no particular pain although she says she cannot get up or take care of herself on her own.   She notes progressive bilateral lower extremity and unintentional weight gain of about 10 pounds recently.  She cannot lie completely flat and sleep sitting daily up.   Past Medical History:  Diagnosis Date  . Anxiety   . Breast cancer ([place]) 1997   right breast mastectomy  . CHF (congestive heart failure) ([place])   . Diabetes mellitus without complication ([place])   . DJD (degenerative joint disease)   . Dysuria   . H/O total knee replacement    right  . HTN (hypertension)   . Hypercholesteremia   . Kidney stone   . Obesity   . Recurrent urinary tract infection   . SVT (supraventricular tachycardia) ([place])   . Urinary incontinence   . Vaginal atrophy   . Yeast vaginitis     Patient Active Problem List   Diagnosis Date Noted  . CKD (chronic kidney disease), stage III ([place]) 08/04/2018  .  Hyperkalemia 07/04/2018  . Atrial fibrillation with RVR ([place]) 04/23/2018  . Syncope 04/22/2018  . Acute respiratory failure with hypoxia and hypercapnia ([place]) 02/21/2017  . Adjustment disorder with anxiety 01/28/2017  . Acute on chronic respiratory failure with hypoxia ([place]) 01/23/2017  . Palliative care encounter   . Goals of care, counseling/discussion   . DNR (do not resuscitate) 10/05/2016  . Palliative care by specialist 10/05/2016  . Depression, major, recurrent, severe with psychosis ([place]) 10/05/2016  . Severe major depression, single episode, with psychotic features ([place]) 10/04/2016  . Cellulitis of leg, right 09/29/2016  . Proctitis 09/29/2016  . Hypercapnia 09/29/2016  . Acute urinary retention 09/29/2016  . UTI (urinary tract infection) 09/29/2016  . Weakness 09/29/2016  . Subacute delirium 09/28/2016  . Gastrointestinal hemorrhage   . Diffuse abdominal pain   . Altered mental status 09/24/2016  . Pressure injury of skin 09/02/2016  . Respiratory failure with hypoxia ([place]) 09/01/2016  . Lymphedema 08/16/2016  . Acute on chronic congestive heart failure ([place]) 08/09/2016  . Acquired lymphedema of leg 04/21/2016  . Congestive heart failure ([place]) 11/25/2015  . Nocturia 11/05/2015  . Urinary frequency 11/05/2015  . Acute respiratory failure with hypoxia ([place]) 09/05/2015  . 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 Incontinence 04/20/2015  . Diabetes mellitus, type 2 ([place]) 04/17/2015  . ESBL (extended spectrum beta-lactamase) producing bacteria infection 04/17/2015  . Hypertension 04/17/2015  . Frequent UTI 04/17/2015  .  Absence of bladder continence 04/17/2015  . Iron deficiency anemia 03/08/2015  . Symptomatic anemia 09/25/2014  . Abdominal pain, lower 09/25/2014  . Urge incontinence 02/19/2013  . FOM (frequency of micturition) 02/19/2013  . Bladder infection, chronic 08/11/2012  . Difficult or painful urination 07/26/2012  . Lower urinary tract infection 12/31/2011  . Diabetes  mellitus ([place]) 12/31/2011    Past Surgical History:  Procedure Laterality Date  . CARDIAC CATHETERIZATION    . CHOLECYSTECTOMY    . FOOT SURGERY    . JOINT REPLACEMENT    . MASTECTOMY Right 1997  . NASAL SEPTUM SURGERY    . PERIPHERAL VASCULAR CATHETERIZATION N/A 07/08/2015   Procedure: PICC Line Insertion;  Surgeon: Algernon Huxley, MD;  Location: [facility];  Service: Cardiovascular;  Laterality: N/A;  . TONSILLECTOMY    . Vocal cords      Prior to Admission medications   Medication Sig Start Date End Date Taking? Authorizing Provider  albuterol (PROVENTIL HFA;VENTOLIN HFA) 108 (90 Base) MCG/ACT inhaler Inhale 2 puffs into the lungs every 6 (six) hours as needed for wheezing or shortness of breath. 07/14/18  Yes Gouru, Illene Silver, MD  citalopram (CELEXA) 10 MG tablet Take 1 tablet (10 mg total) by mouth daily. 02/04/17  Yes Wieting, Richard, MD  insulin glargine (LANTUS) 100 UNIT/ML injection Inject 0.1 mLs (10 Units total) into the skin daily. Patient taking differently: Inject 10 Units into the skin at bedtime.  07/15/18  Yes Gouru, Illene Silver, MD  levETIRAcetam (KEPPRA) 250 MG tablet Take 1 tablet (250 mg total) by mouth 2 (two) times daily. 10/26/16  Yes Epifanio Lesches, MD  metFORMIN (GLUCOPHAGE) 500 MG tablet Take 1,000 mg by mouth 2 (two) times daily. 07/27/18  Yes [provider]  metoprolol tartrate (LOPRESSOR) 25 MG tablet Take 1 tablet (25 mg total) by mouth 2 (two) times daily. 07/14/18  Yes Gouru, Illene Silver, MD  nitrofurantoin (MACRODANTIN) 100 MG capsule Take 1 capsule (100 mg total) by mouth daily. 02/06/18  Yes MacDiarmid, Nicki Reaper, MD  OLANZapine (ZYPREXA) 10 MG tablet Take 10 mg by mouth at bedtime. 08/01/18  Yes [provider]  ondansetron (ZOFRAN-ODT) 8 MG disintegrating tablet Take 8 mg by mouth every 8 (eight) hours as needed for nausea/vomiting. 08/02/18  Yes [provider]  pantoprazole (PROTONIX) 40 MG tablet Take 40 mg by mouth 2 (two) times  daily. 02/09/17  Yes [provider]  torsemide (DEMADEX) 20 MG tablet Take 2 tablets (40 mg total) by mouth daily. 07/15/18  Yes Gouru, Illene Silver, MD  acetaminophen (TYLENOL) 325 MG tablet Take 2 tablets (650 mg total) by mouth every 6 (six) hours as needed for mild pain (or Fever >/= 101). 04/26/18   Loletha Grayer, MD  acidophilus (RISAQUAD) CAPS capsule Take 1 capsule by mouth at bedtime.    [provider]  aspirin EC 81 MG EC tablet Take 1 tablet (81 mg total) by mouth daily. 04/26/18   Loletha Grayer, MD  clonazePAM (KLONOPIN) 0.25 MG disintegrating tablet Take 1 tablet (0.25 mg total) by mouth 2 (two) times daily. 07/14/18   Nicholes Mango, MD  docusate sodium (COLACE) 100 MG capsule Take 100 mg by mouth 2 (two) times daily.     [provider]  feeding supplement, ENSURE ENLIVE, (ENSURE ENLIVE) LIQD Take 237 mLs by mouth 3 (three) times daily between meals. Patient not taking: Reported on 04/22/2018 10/26/16   Epifanio Lesches, MD  fluticasone [facility]) 50 MCG/ACT nasal spray Place 2 sprays into both nostrils  daily.    [provider]  hydrALAZINE (APRESOLINE) 50 MG tablet Take 1 tablet (50 mg total) by mouth every 8 (eight) hours. 04/26/18   Loletha Grayer, MD  insulin glargine (LANTUS) 100 UNIT/ML injection Inject 0.15 mLs (15 Units total) into the skin at bedtime. 07/14/18   Nicholes Mango, MD  iron polysaccharides (NIFEREX) 150 MG capsule Take 150 mg by mouth 3 (three) times daily.     [provider]  lamoTRIgine (LAMICTAL) 25 MG tablet Take 1 tablet (25 mg total) by mouth daily. 04/26/18   Loletha Grayer, MD  loratadine (CLARITIN) 10 MG tablet Take 10 mg by mouth daily.    [provider]  Melatonin 3 MG TABS Take 6 mg by mouth at bedtime.    [provider]  polyethylene glycol (MIRALAX / GLYCOLAX) packet Take 17 g by mouth daily. 04/26/18   Loletha Grayer, MD  potassium chloride 20 MEQ TBCR Take 20 mEq by mouth daily. 07/14/18    Gouru, Illene Silver, MD  senna-docusate (SENOKOT-S) 8.6-50 MG tablet Take 1 tablet by mouth at bedtime as needed for mild constipation. 07/14/18   Gouru, Illene Silver, MD  tolterodine (DETROL LA) 4 MG 24 hr capsule Take 4 mg by mouth daily.    [provider]    Allergies Sulfa antibiotics; Biaxin [clarithromycin]; Influenza a (h1n1) monoval vac; Morphine; Pyridium [phenazopyridine hcl]; Ceftriaxone; Latex; Prednisone; and Tape  Family History  Problem Relation Age of Onset  . Lung cancer Father   . Hematuria Mother   . Lung cancer Mother   . Kidney disease Neg Hx   . Bladder Cancer Neg Hx   . Breast cancer Neg Hx     Social History Social History   Tobacco Use  . Smoking status: Former Research scientist (life sciences)  . Smokeless tobacco: Former Systems developer    Quit date: 10/29/1995  Substance Use Topics  . Alcohol use: No    Alcohol/week: 0.0 standard drinks  . Drug use: No    Review of Systems Constitutional: No fever/chills Eyes: No visual changes. ENT: No sore throat. Cardiovascular: Denies chest pain. Respiratory: Positive for shortness of breath. Gastrointestinal: No abdominal pain.  No nausea, no vomiting.  No diarrhea.  No constipation. Genitourinary: Negative for dysuria. Musculoskeletal: Negative for back pain. Skin: Negative for rash. Neurological: Negative for headaches, focal weakness or numbness.   ____________________________________________   PHYSICAL EXAM:  VITAL SIGNS: ED Triage Vitals  Enc Vitals Group     BP      Pulse      Resp      Temp      Temp src      SpO2      Weight      Height      Head Circumference      Peak Flow      Pain Score      Pain Loc      Pain Edu?      Excl. in [place]?     Constitutional: Appears extremely pale and tired.  Nontoxic no diaphoresis Eyes: PERRL EOMI. pale conjunctiva Head: Atraumatic. Nose: No congestion/rhinnorhea. Mouth/Throat: No trismus Neck: No stridor.  Unable to lie completely flat.  Significant JVD. Cardiovascular:  Tachycardic rate, regular rhythm. Grossly normal heart sounds.  Good peripheral circulation. Respiratory: Increased respiratory effort with crackles bilateral bases Gastrointestinal: Soft nontender Rectal exam performed with female nurse Anderson Malta is a chaperone: Guaiac positive control positive melena Musculoskeletal: 4+ pitting edema bilaterally to thighs.  Legs equal bilaterally Neurologic:  Normal speech and language. No gross focal neurologic deficits are appreciated. Skin:  Skin is warm, dry and intact. No rash noted. Psychiatric: Mood and affect are normal. Speech and behavior are normal.    ____________________________________________   DIFFERENTIAL includes but not limited to  Upper GI bleed, lower GI bleed, pneumonia, pulmonary edema, pneumothorax, pulmonary embolism ____________________________________________   LABS (all labs ordered are listed, but only abnormal results are displayed)  Labs Reviewed  COMPREHENSIVE METABOLIC PANEL - Abnormal; Notable for the following components:      Result Value   Potassium 6.3 (*)    Glucose, Bld 175 (*)    BUN 52 (*)    Creatinine, Ser 2.80 (*)    AST 43 (*)    GFR calc non Af Amer 15 (*)    GFR calc Af Amer 18 (*)    All other components within normal limits  CBC WITH DIFFERENTIAL/PLATELET - Abnormal; Notable for the following components:   RBC 2.43 (*)    Hemoglobin 7.0 (*)    HCT 23.6 (*)    MCHC 29.7 (*)    RDW 16.0 (*)    All other components within normal limits  BRAIN NATRIURETIC PEPTIDE - Abnormal; Notable for the following components:   B Natriuretic Peptide 677.0 (*)    All other components within normal limits  URINALYSIS, COMPLETE (UACMP) WITH MICROSCOPIC - Abnormal; Notable for the following components:   Color, Urine RED (*)    APPearance CLOUDY (*)    Hgb urine dipstick LARGE (*)    Protein, ur 100 (*)    Leukocytes, UA LARGE (*)    RBC / HPF >50 (*)    WBC, UA >50 (*)    Bacteria, UA RARE (*)    All  other components within normal limits  BASIC METABOLIC PANEL - Abnormal; Notable for the following components:   Potassium 5.7 (*)    Glucose, Bld 160 (*)    BUN 45 (*)    Creatinine, Ser 2.49 (*)    GFR calc non Af Amer 18 (*)    GFR calc Af Amer 21 (*)    All other components within normal limits  CBC - Abnormal; Notable for the following components:   RBC 3.11 (*)    Hemoglobin 8.9 (*)    HCT 29.1 (*)    RDW 16.4 (*)    All other components within normal limits  GLUCOSE, CAPILLARY - Abnormal; Notable for the following components:   Glucose-Capillary 154 (*)    All other components within normal limits  GLUCOSE, CAPILLARY - Abnormal; Notable for the following components:   Glucose-Capillary 154 (*)    All other components within normal limits  URINALYSIS, COMPLETE (UACMP) WITH MICROSCOPIC - Abnormal; Notable for the following components:   Color, Urine YELLOW (*)    APPearance CLEAR (*)    Hgb urine dipstick MODERATE (*)    Protein, ur 100 (*)    Bacteria, UA RARE (*)    All other components within normal limits  HEMOGLOBIN AND HEMATOCRIT, BLOOD - Abnormal; Notable for the following components:   Hemoglobin 8.9 (*)    HCT 28.7 (*)    All other components within normal limits  BASIC METABOLIC PANEL - Abnormal; Notable for the following components:   Potassium 5.9 (*)    Glucose, Bld 155 (*)    BUN 43 (*)    Creatinine, Ser 2.51 (*)    GFR calc non Af Amer 18 (*)    GFR calc  Af Amer 20 (*)    All other components within normal limits  GLUCOSE, CAPILLARY - Abnormal; Notable for the following components:   Glucose-Capillary 134 (*)    All other components within normal limits  GLUCOSE, CAPILLARY - Abnormal; Notable for the following components:   Glucose-Capillary 106 (*)    All other components within normal limits  GLUCOSE, CAPILLARY - Abnormal; Notable for the following components:   Glucose-Capillary 195 (*)    All other components within normal limits  GLUCOSE,  CAPILLARY - Abnormal; Notable for the following components:   Glucose-Capillary 177 (*)    All other components within normal limits  TROPONIN I  TYPE AND SCREEN  PREPARE RBC (CROSSMATCH)    Lab work reviewed by me shows potassium of 6.3 along with acute kidney injury.  Her hemoglobin is also dropped down to 7.0 __________________________________________  EKG  ED ECG REPORT I, Darel Hong, the attending physician, personally viewed and interpreted this ECG.  Date: 08/03/2018 EKG Time:  Rate: 71 Rhythm: normal sinus rhythm QRS Axis: normal Intervals: normal ST/T Wave abnormalities: normal Narrative Interpretation: Significant motion makes interpretation somewhat difficult although no acute ischemia noted  ____________________________________________  RADIOLOGY  Chest x-ray reviewed by me shows interstitial edema consistent with fluid overload ____________________________________________   PROCEDURES  Procedure(s) performed: no  .Critical Care Performed by: Darel Hong, MD Authorized by: Darel Hong, MD   Critical care provider statement:    Critical care time (minutes):  40   Critical care time was exclusive of:  Separately billable procedures and treating other patients   Critical care was necessary to treat or prevent imminent or life-threatening deterioration of the following conditions:  Metabolic crisis and renal failure   Critical care was time spent personally by me on the following activities:  Development of treatment plan with patient or surrogate, discussions with consultants, evaluation of patient's response to treatment, examination of patient, obtaining history from patient or surrogate, ordering and performing treatments and interventions, ordering and review of laboratory studies, ordering and review of radiographic studies, pulse oximetry, re-evaluation of patient's condition and review of old charts    Critical Care performed:  Yes  ____________________________________________   INITIAL IMPRESSION / ASSESSMENT AND PLAN / ED COURSE  Pertinent labs & imaging results that were available during my care of the patient were reviewed by me and considered in my medical decision making (see chart for details).   As part of my medical decision making, I reviewed the following data within the [facility] History obtained from family if available, nursing notes, old chart and ekg, as well as notes from prior ED visits.  The patient arrives short of breath saturating 78 to 80% on room air although coming up nicely on 3 L of nasal cannula.  Differential is extremely broad but she has a known history of congestive heart failure and clinically appears fluid overloaded.  Labs are pending.  Patient's labs are quite abnormal.  Most life-threatening [place] is a potassium of 6.3 although she has no EKG changes.  We will treat her with 15 mg of albuterol nebulized, 5 units of IV insulin, D50, and 80 mg of IV furosemide which should help her peripheral edema.  She also has frank melena with a hemoglobin down to 7 social require a blood transfusion as well.  At this point she has multiple life-threatening abnormalities requires inpatient admission for continued blood transfusion, management of her profound hyperkalemia and acute kidney injury.  The patient verbalizes  understanding and agreement with the plan.  I then discussed with the hospitalist Dr. Marcille Blanco who has graciously agreed to admit the patient to his service.      ____________________________________________   FINAL CLINICAL IMPRESSION(S) / ED DIAGNOSES  Final diagnoses:  Acute on chronic respiratory failure with hypoxia (HCC)  Hyperkalemia  Symptomatic anemia  Gastrointestinal hemorrhage, unspecified gastrointestinal hemorrhage type  Acute kidney injury ([place])      NEW MEDICATIONS STARTED DURING THIS VISIT:  Current Discharge Medication List        Note:  This document was prepared using Dragon voice recognition software and may include unintentional dictation errors.     Darel Hong, MD 08/05/18 [phone]

## 2018-08-03 NOTE — ED Triage Notes (Signed)
Patient to ER from home via [facility] for c/o having difficulty getting out of bed, but upon EMS arrival, patient's O2 sat was 80% on RA. Patient has h/o CHF, but denies h/o COPD. Patient's sats increased to 91% on 4L O2. Patient was significantly more short of breath just moving from chair to stretcher for EMS. Patient states she wears 3L O2 at home at all times. Also c/o generalized weakness.  Per patient, saw regular MD today who noticed more swelling in legs, but no interventions given. Patient also reports N/V today, was given Zofran at MD visit.

## 2018-08-04 DIAGNOSIS — Z7189 Other specified counseling: Secondary | ICD-10-CM

## 2018-08-04 DIAGNOSIS — N183 Chronic kidney disease, stage 3 unspecified: Secondary | ICD-10-CM | POA: Diagnosis present

## 2018-08-04 DIAGNOSIS — Z515 Encounter for palliative care: Secondary | ICD-10-CM | POA: Diagnosis not present

## 2018-08-04 DIAGNOSIS — D649 Anemia, unspecified: Secondary | ICD-10-CM | POA: Diagnosis not present

## 2018-08-04 DIAGNOSIS — D500 Iron deficiency anemia secondary to blood loss (chronic): Secondary | ICD-10-CM | POA: Diagnosis not present

## 2018-08-04 LAB — GLUCOSE, CAPILLARY
Glucose-Capillary: 154 mg/dL — ABNORMAL HIGH (ref 70–99)
Glucose-Capillary: 154 mg/dL — ABNORMAL HIGH (ref 70–99)
Glucose-Capillary: 134 mg/dL — ABNORMAL HIGH (ref 70–99)
Glucose-Capillary: 106 mg/dL — ABNORMAL HIGH (ref 70–99)

## 2018-08-04 LAB — COMPREHENSIVE METABOLIC PANEL
ALBUMIN: 3.7 g/dL (ref 3.5–5.0)
ALK PHOS: 82 U/L (ref 38–126)
ALT: 17 U/L (ref 0–44)
AST: 43 U/L — ABNORMAL HIGH (ref 15–41)
Anion gap: 7 (ref 5–15)
BILIRUBIN TOTAL: 0.8 mg/dL (ref 0.3–1.2)
BUN: 52 mg/dL — ABNORMAL HIGH (ref 8–23)
CO2: 25 mmol/L (ref 22–32)
Calcium: 9.2 mg/dL (ref 8.9–10.3)
Chloride: 104 mmol/L (ref 98–111)
Creatinine, Ser: 2.8 mg/dL — ABNORMAL HIGH (ref 0.44–1.00)
GFR calc Af Amer: 18 mL/min — ABNORMAL LOW (ref >60)
GFR calc non Af Amer: 15 mL/min — ABNORMAL LOW (ref >60)
GLUCOSE: 175 mg/dL — AB (ref 70–99)
POTASSIUM: 6.3 mmol/L — AB (ref 3.5–5.1)
SODIUM: 136 mmol/L (ref 135–145)
TOTAL PROTEIN: 6.9 g/dL (ref 6.5–8.1)

## 2018-08-04 LAB — CBC WITH DIFFERENTIAL/PLATELET
ABS IMMATURE GRANULOCYTES: 0.07 10*3/uL (ref 0.00–0.07)
BASOS PCT: 0 %
Basophils Absolute: 0 10*3/uL (ref 0.0–0.1)
Eosinophils Absolute: 0.3 10*3/uL (ref 0.0–0.5)
Eosinophils Relative: 5 %
HEMATOCRIT: 23.6 % — AB (ref 36.0–46.0)
HEMOGLOBIN: 7 g/dL — AB (ref 12.0–15.0)
IMMATURE GRANULOCYTES: 1 %
LYMPHS ABS: 1 10*3/uL (ref 0.7–4.0)
Lymphocytes Relative: 15 %
MCH: 28.8 pg (ref 26.0–34.0)
MCHC: 29.7 g/dL — ABNORMAL LOW (ref 30.0–36.0)
MCV: 97.1 fL (ref 80.0–100.0)
MONOS PCT: 10 %
Monocytes Absolute: 0.7 10*3/uL (ref 0.1–1.0)
NEUTROS ABS: 4.6 10*3/uL (ref 1.7–7.7)
NEUTROS PCT: 69 %
PLATELETS: 231 10*3/uL (ref 150–400)
RBC: 2.43 MIL/uL — ABNORMAL LOW (ref 3.87–5.11)
RDW: 16 % — ABNORMAL HIGH (ref 11.5–15.5)
WBC: 6.7 10*3/uL (ref 4.0–10.5)
nRBC: 0 % (ref 0.0–0.2)

## 2018-08-04 LAB — URINALYSIS, COMPLETE (UACMP) WITH MICROSCOPIC
Bilirubin Urine: NEGATIVE
Bilirubin Urine: NEGATIVE
GLUCOSE, UA: NEGATIVE mg/dL
Glucose, UA: NEGATIVE mg/dL
KETONES UR: NEGATIVE mg/dL
Ketones, ur: NEGATIVE mg/dL
Leukocytes, UA: NEGATIVE
Nitrite: NEGATIVE
Nitrite: NEGATIVE
PH: 5 (ref 5.0–8.0)
PH: 5 (ref 5.0–8.0)
Protein, ur: 100 mg/dL — AB
Protein, ur: 100 mg/dL — AB
SPECIFIC GRAVITY, URINE: 1.008 (ref 1.005–1.030)
Specific Gravity, Urine: 1.009 (ref 1.005–1.030)
WBC, UA: >50 WBC/hpf — ABNORMAL HIGH (ref 0–5)

## 2018-08-04 LAB — BASIC METABOLIC PANEL
Anion gap: 8 (ref 5–15)
Anion gap: 7 (ref 5–15)
BUN: 45 mg/dL — ABNORMAL HIGH (ref 8–23)
BUN: 43 mg/dL — ABNORMAL HIGH (ref 8–23)
CALCIUM: 9.3 mg/dL (ref 8.9–10.3)
CO2: 27 mmol/L (ref 22–32)
CO2: 25 mmol/L (ref 22–32)
Calcium: 9.3 mg/dL (ref 8.9–10.3)
Chloride: 104 mmol/L (ref 98–111)
Chloride: 105 mmol/L (ref 98–111)
Creatinine, Ser: 2.49 mg/dL — ABNORMAL HIGH (ref 0.44–1.00)
Creatinine, Ser: 2.51 mg/dL — ABNORMAL HIGH (ref 0.44–1.00)
GFR calc Af Amer: 21 mL/min — ABNORMAL LOW (ref >60)
GFR calc non Af Amer: 18 mL/min — ABNORMAL LOW (ref >60)
GFR, EST AFRICAN AMERICAN: 20 mL/min — AB (ref >60)
GFR, EST NON AFRICAN AMERICAN: 18 mL/min — AB (ref >60)
Glucose, Bld: 160 mg/dL — ABNORMAL HIGH (ref 70–99)
Glucose, Bld: 155 mg/dL — ABNORMAL HIGH (ref 70–99)
POTASSIUM: 5.9 mmol/L — AB (ref 3.5–5.1)
Potassium: 5.7 mmol/L — ABNORMAL HIGH (ref 3.5–5.1)
Sodium: 139 mmol/L (ref 135–145)
Sodium: 137 mmol/L (ref 135–145)

## 2018-08-04 LAB — HEMOGLOBIN AND HEMATOCRIT, BLOOD
HEMATOCRIT: 28.7 % — AB (ref 36.0–46.0)
HEMOGLOBIN: 8.9 g/dL — AB (ref 12.0–15.0)

## 2018-08-04 LAB — CBC
HCT: 29.1 % — ABNORMAL LOW (ref 36.0–46.0)
Hemoglobin: 8.9 g/dL — ABNORMAL LOW (ref 12.0–15.0)
MCH: 28.6 pg (ref 26.0–34.0)
MCHC: 30.6 g/dL (ref 30.0–36.0)
MCV: 93.6 fL (ref 80.0–100.0)
Platelets: 222 10*3/uL (ref 150–400)
RBC: 3.11 MIL/uL — ABNORMAL LOW (ref 3.87–5.11)
RDW: 16.4 % — ABNORMAL HIGH (ref 11.5–15.5)
WBC: 6.9 10*3/uL (ref 4.0–10.5)
nRBC: 0 % (ref 0.0–0.2)

## 2018-08-04 LAB — TROPONIN I: Troponin I: <0.03 ng/mL (ref <0.03)

## 2018-08-04 LAB — PREPARE RBC (CROSSMATCH)

## 2018-08-04 LAB — BRAIN NATRIURETIC PEPTIDE: B Natriuretic Peptide: 677 pg/mL — ABNORMAL HIGH (ref 0.0–100.0)

## 2018-08-04 MED ORDER — TORSEMIDE 20 MG PO TABS
40.0000 mg | ORAL_TABLET | Freq: Every day | ORAL | Status: DC
  Administered 2018-08-04 – 2018-08-07 (×4): 40 mg via ORAL
  Filled 2018-08-04 (×4): qty 2

## 2018-08-04 MED ORDER — FUROSEMIDE 10 MG/ML IJ SOLN
60.0000 mg | Freq: Once | INTRAMUSCULAR | Status: AC
  Administered 2018-08-04: 60 mg via INTRAVENOUS
  Filled 2018-08-04: qty 6

## 2018-08-04 MED ORDER — METOPROLOL TARTRATE 25 MG PO TABS
25.0000 mg | ORAL_TABLET | Freq: Two times a day (BID) | ORAL | Status: DC
  Administered 2018-08-04 – 2018-08-07 (×7): 25 mg via ORAL
  Filled 2018-08-04 (×7): qty 1

## 2018-08-04 MED ORDER — MELATONIN 3 MG PO TABS: 6.0000 mg | ORAL_TABLET | Freq: Every day | ORAL | Status: DC

## 2018-08-04 MED ORDER — ENSURE ENLIVE PO LIQD
237.0000 mL | Freq: Three times a day (TID) | ORAL | Status: DC
  Administered 2018-08-04 – 2018-08-07 (×2): 237 mL via ORAL

## 2018-08-04 MED ORDER — NITROFURANTOIN MACROCRYSTAL 50 MG PO CAPS
100.0000 mg | ORAL_CAPSULE | Freq: Every day | ORAL | Status: DC
  Administered 2018-08-04 – 2018-08-06 (×3): 100 mg via ORAL
  Filled 2018-08-04 (×3): qty 2

## 2018-08-04 MED ORDER — INSULIN GLARGINE 100 UNIT/ML ~~LOC~~ SOLN
5.0000 [IU] | Freq: Every day | SUBCUTANEOUS | Status: DC
  Administered 2018-08-05 – 2018-08-06 (×2): 5 [IU] via SUBCUTANEOUS
  Filled 2018-08-04 (×4): qty 0.05

## 2018-08-04 MED ORDER — ONDANSETRON 8 MG PO TBDP
8.0000 mg | ORAL_TABLET | Freq: Three times a day (TID) | ORAL | Status: DC | PRN
  Administered 2018-08-05 (×2): 8 mg via ORAL
  Filled 2018-08-04 (×4): qty 1

## 2018-08-04 MED ORDER — INSULIN ASPART 100 UNIT/ML ~~LOC~~ SOLN
5.0000 [IU] | Freq: Once | SUBCUTANEOUS | Status: AC
  Administered 2018-08-04: 5 [IU] via INTRAVENOUS
  Filled 2018-08-04: qty 1

## 2018-08-04 MED ORDER — SODIUM CHLORIDE 0.9 % IV SOLN
10.0000 mL/h | Freq: Once | INTRAVENOUS | Status: AC
  Administered 2018-08-04: 10 mL/h via INTRAVENOUS

## 2018-08-04 MED ORDER — POLYSACCHARIDE IRON COMPLEX 150 MG PO CAPS
150.0000 mg | ORAL_CAPSULE | Freq: Three times a day (TID) | ORAL | Status: DC
  Administered 2018-08-04 – 2018-08-07 (×9): 150 mg via ORAL
  Filled 2018-08-04 (×12): qty 1

## 2018-08-04 MED ORDER — CLONAZEPAM 0.125 MG PO TBDP
0.2500 mg | ORAL_TABLET | Freq: Two times a day (BID) | ORAL | Status: DC
  Administered 2018-08-04 – 2018-08-07 (×6): 0.25 mg via ORAL
  Filled 2018-08-04 (×6): qty 2

## 2018-08-04 MED ORDER — ACETAMINOPHEN 650 MG RE SUPP: 650.0000 mg | Freq: Four times a day (QID) | RECTAL | Status: DC | PRN

## 2018-08-04 MED ORDER — PANTOPRAZOLE SODIUM 40 MG IV SOLR
40.0000 mg | Freq: Once | INTRAVENOUS | Status: AC
  Administered 2018-08-04: 40 mg via INTRAVENOUS
  Filled 2018-08-04 (×2): qty 40

## 2018-08-04 MED ORDER — INSULIN ASPART 100 UNIT/ML ~~LOC~~ SOLN
0.0000 [IU] | Freq: Four times a day (QID) | SUBCUTANEOUS | Status: DC
  Administered 2018-08-05 – 2018-08-07 (×6): 3 [IU] via SUBCUTANEOUS
  Administered 2018-08-07: 2 [IU] via SUBCUTANEOUS
  Filled 2018-08-04 (×9): qty 1

## 2018-08-04 MED ORDER — ASPIRIN EC 81 MG PO TBEC
81.0000 mg | DELAYED_RELEASE_TABLET | Freq: Every day | ORAL | Status: DC
  Administered 2018-08-04: 81 mg via ORAL
  Filled 2018-08-04: qty 1

## 2018-08-04 MED ORDER — CITALOPRAM HYDROBROMIDE 20 MG PO TABS
10.0000 mg | ORAL_TABLET | Freq: Every day | ORAL | Status: DC
  Administered 2018-08-04 – 2018-08-07 (×4): 10 mg via ORAL
  Filled 2018-08-04 (×4): qty 1

## 2018-08-04 MED ORDER — PANTOPRAZOLE SODIUM 40 MG PO TBEC
40.0000 mg | DELAYED_RELEASE_TABLET | Freq: Two times a day (BID) | ORAL | Status: DC
  Administered 2018-08-04 (×2): 40 mg via ORAL
  Filled 2018-08-04 (×2): qty 1

## 2018-08-04 MED ORDER — FESOTERODINE FUMARATE ER 4 MG PO TB24
4.0000 mg | ORAL_TABLET | Freq: Every day | ORAL | Status: DC
  Administered 2018-08-04 – 2018-08-07 (×4): 4 mg via ORAL
  Filled 2018-08-04 (×4): qty 1

## 2018-08-04 MED ORDER — LEVETIRACETAM 250 MG PO TABS
250.0000 mg | ORAL_TABLET | Freq: Two times a day (BID) | ORAL | Status: DC
  Administered 2018-08-04 – 2018-08-07 (×7): 250 mg via ORAL
  Filled 2018-08-04 (×8): qty 1

## 2018-08-04 MED ORDER — ALBUTEROL SULFATE (2.5 MG/3ML) 0.083% IN NEBU: 3.0000 mL | INHALATION_SOLUTION | Freq: Four times a day (QID) | RESPIRATORY_TRACT | Status: DC | PRN

## 2018-08-04 MED ORDER — FUROSEMIDE 10 MG/ML IJ SOLN
80.0000 mg | Freq: Once | INTRAMUSCULAR | Status: AC
  Administered 2018-08-04: 80 mg via INTRAVENOUS
  Filled 2018-08-04: qty 8

## 2018-08-04 MED ORDER — DOCUSATE SODIUM 100 MG PO CAPS
100.0000 mg | ORAL_CAPSULE | Freq: Two times a day (BID) | ORAL | Status: DC
  Administered 2018-08-04 – 2018-08-07 (×7): 100 mg via ORAL
  Filled 2018-08-04 (×7): qty 1

## 2018-08-04 MED ORDER — ALBUTEROL SULFATE (2.5 MG/3ML) 0.083% IN NEBU
15.0000 mg | INHALATION_SOLUTION | Freq: Once | RESPIRATORY_TRACT | Status: AC
  Administered 2018-08-04: 15 mg via RESPIRATORY_TRACT
  Filled 2018-08-04: qty 18

## 2018-08-04 MED ORDER — OLANZAPINE 10 MG PO TABS
10.0000 mg | ORAL_TABLET | Freq: Every day | ORAL | Status: DC
  Administered 2018-08-04 – 2018-08-06 (×3): 10 mg via ORAL
  Filled 2018-08-04 (×4): qty 1

## 2018-08-04 MED ORDER — HYDRALAZINE HCL 50 MG PO TABS
50.0000 mg | ORAL_TABLET | Freq: Three times a day (TID) | ORAL | Status: DC
  Administered 2018-08-04 – 2018-08-07 (×10): 50 mg via ORAL
  Filled 2018-08-04 (×10): qty 1

## 2018-08-04 MED ORDER — INSULIN ASPART 100 UNIT/ML ~~LOC~~ SOLN
0.0000 [IU] | SUBCUTANEOUS | Status: DC
  Administered 2018-08-04: 2 [IU] via SUBCUTANEOUS
  Administered 2018-08-04: 1 [IU] via SUBCUTANEOUS
  Administered 2018-08-04: 2 [IU] via SUBCUTANEOUS
  Filled 2018-08-04 (×2): qty 1

## 2018-08-04 MED ORDER — ACETAMINOPHEN 325 MG PO TABS: 650.0000 mg | ORAL_TABLET | Freq: Four times a day (QID) | ORAL | Status: DC | PRN

## 2018-08-04 MED ORDER — HEPARIN SODIUM (PORCINE) 5000 UNIT/ML IJ SOLN
5000.0000 [IU] | Freq: Three times a day (TID) | INTRAMUSCULAR | Status: DC
  Administered 2018-08-04: 5000 [IU] via SUBCUTANEOUS
  Filled 2018-08-04: qty 1

## 2018-08-04 MED ORDER — MELATONIN 5 MG PO TABS
5.0000 mg | ORAL_TABLET | Freq: Every day | ORAL | Status: DC
  Administered 2018-08-04 – 2018-08-06 (×3): 5 mg via ORAL
  Filled 2018-08-04 (×4): qty 1

## 2018-08-04 MED ORDER — DEXTROSE 50 % IV SOLN
1.0000 | Freq: Once | INTRAVENOUS | Status: AC
  Administered 2018-08-04: 50 mL via INTRAVENOUS
  Filled 2018-08-04: qty 50

## 2018-08-04 NOTE — Progress Notes (Signed)
Pt tolerated transfusion of one more unit of rbc's. Hemoglobin 8.9 post tx.  Remains alert and oriented and painfree. Phys therapy to see her tomorrow. Pure wick inplace. U/o good.

## 2018-08-04 NOTE — Care Management Note (Addendum)
Case Management Note  Patient Details  Name: SMRITHI PIGFORD MRN: [id number] Date of Birth: [birth year]  Subjective/Objective:     Patient from home with anemia.  Requiring 2 units PRBC's today.  Has 24 hour private care through [facility].  She denies difficulty obtaining medications or with transportation.  She gets her medications through [facility].  Uses a walker.  [place] O2 at 2L.  Currently on 3L.  Will continue to follow and progress patient.   Followed by outpatient Palliative at home.  Placed PT order.  Action/Plan:   Expected Discharge Date:                  Expected Discharge Plan:     In-House Referral:     Discharge planning Services  CM Consult  Post Acute Care Choice:    Choice offered to:     DME Arranged:    DME Agency:     HH Arranged:    HH Agency:     Status of Service:  In process, will continue to follow  If discussed at Long Length of Stay Meetings, dates discussed:    Additional Comments:  Elza Rafter, RN 08/04/2018, 4:53 PM

## 2018-08-04 NOTE — Progress Notes (Signed)
[facility]  ROUNDING NOTE   Subjective:   Patient known to our practice from recent admission in September She presents via EMS from home for difficulty getting out of bed, found to be hypoxemic with sats of 80% on room air.  Reported generalized weakness, increasing lower extremity edema.  Patient is quite lethargic today.  Not very participative in the interview.  Information obtained from the chart. Creatinine elevated at 2.80 at admission along with potassium of 6.3 Nephrology consult for acute kidney injury and hyperkalemia  Objective:  Vital signs in last 24 hours:  Temp:  [97.6 F (36.4 C)-98.5 F (36.9 C)] 97.6 F (36.4 C) (10/18 1625) Pulse Rate:  [65-81] 67 (10/18 1625) Resp:  [15-29] 22 (10/18 1200) BP: (146-189)/(55-87) 172/72 (10/18 1625) SpO2:  [81 %-100 %] 95 % (10/18 1625) Weight:  [116.3 kg-120.6 kg] 120.6 kg (10/18 0542)  Weight change:  Filed Weights   08/03/18 2329 08/04/18 0542  Weight: 116.3 kg 120.6 kg    Intake/Output: I/O last 3 completed shifts: In: 360 [Blood:360] Out: -    Intake/Output this shift:  Total I/O In: 360 [Blood:360] Out: 1100 [Urine:1100]  Physical Exam: General: Ill appearing  Head: Normocephalic, atraumatic.    Eyes: Anicteric,    Neck: Supple,    Lungs:  mild basilar crackles, [place] O2 supplementation  Heart: irregular  Abdomen:  Soft, nontender, obese  Extremities: 1-2+ peripheral edema.  Neurologic: Alert and oriented  Skin: No lesions   External catheter in place    Basic Metabolic Panel: Recent Labs  Lab 08/03/18 2340 08/04/18 1252  NA 136 139  K 6.3* 5.7*  CL 104 104  CO2 25 27  GLUCOSE 175* 160*  BUN 52* 45*  CREATININE 2.80* 2.49*  CALCIUM 9.2 9.3    Liver Function Tests: Recent Labs  Lab 08/03/18 2340  AST 43*  ALT 17  ALKPHOS 82  BILITOT 0.8  PROT 6.9  ALBUMIN 3.7   No results for input(s): LIPASE, AMYLASE in the last 168 hours. No results for input(s): AMMONIA in the last  168 hours.  CBC: Recent Labs  Lab 08/03/18 2340 08/04/18 1252  WBC 6.7 6.9  NEUTROABS 4.6  --   HGB 7.0* 8.9*  HCT 23.6* 29.1*  MCV 97.1 93.6  PLT 231 222    Cardiac Enzymes: Recent Labs  Lab 08/03/18 2340  TROPONINI <0.03    BNP: Invalid input(s): POCBNP  CBG: Recent Labs  Lab 08/04/18 0810 08/04/18 1126  GLUCAP 154* 154*    Microbiology: Results for orders placed or performed during the hospital encounter of 07/04/18  MRSA PCR Screening     Status: None   Collection Time: 07/04/18 10:10 PM  Result Value Ref Range Status   MRSA by PCR NEGATIVE NEGATIVE Final    Comment:        The GeneXpert MRSA Assay (FDA approved for NASAL specimens only), is one component of a comprehensive MRSA colonization surveillance program. It is not intended to diagnose MRSA infection nor to guide or monitor treatment for MRSA infections. Performed at [facility], [address], [place], [place] [address]   Culture, Urine     Status: Abnormal   Collection Time: 07/05/18  5:13 AM  Result Value Ref Range Status   Specimen Description   Final    URINE, RANDOM Performed at [facility], [address], [place], [place] [address]    Special Requests   Final    NONE Performed at [facility], [place], [place] [address]    Culture 30,000 COLONIES/mL ENTEROCOCCUS FAECALIS (A)  Final   Report Status 07/07/2018 FINAL  Final   Organism ID, Bacteria ENTEROCOCCUS FAECALIS (A)  Final      Susceptibility   Enterococcus faecalis - MIC*    AMPICILLIN <=2 SENSITIVE Sensitive     LEVOFLOXACIN >=8 RESISTANT Resistant     NITROFURANTOIN 64 INTERMEDIATE Intermediate     VANCOMYCIN 1 SENSITIVE Sensitive     * 30,000 COLONIES/mL ENTEROCOCCUS FAECALIS    Coagulation Studies: No results for input(s): LABPROT, INR in the last 72 hours.  Urinalysis: Recent Labs    08/04/18 0419 08/04/18 1542  COLORURINE RED* YELLOW*  LABSPEC 1.008 1.009   PHURINE 5.0 5.0  GLUCOSEU NEGATIVE NEGATIVE  HGBUR LARGE* MODERATE*  BILIRUBINUR NEGATIVE NEGATIVE  KETONESUR NEGATIVE NEGATIVE  PROTEINUR 100* 100*  NITRITE NEGATIVE NEGATIVE  LEUKOCYTESUR LARGE* NEGATIVE      Imaging: Dg Chest Port 1 View  Result Date: 08/03/2018 CLINICAL DATA:  Hypoxia and shortness of breath. EXAM: PORTABLE CHEST 1 VIEW COMPARISON:  Radiograph 07/07/2018 FINDINGS: Cardiomegaly is unchanged from prior exam. Unchanged mediastinal contours. Aortic atherosclerosis. Improved pulmonary edema from prior exam with residual or recurrent vascular congestion. Streaky retrocardiac opacity has also improved from prior. No large pleural effusion. No pneumothorax. The bones are under mineralized. IMPRESSION: Cardiomegaly with vascular congestion. Improved pulmonary edema from prior exam. Persistent but improved streaky retrocardiac opacity is likely atelectasis. Electronically Signed   By: Keith Rake M.D.   On: 08/03/2018 23:55     Medications:    . citalopram  10 mg Oral Daily  . clonazePAM  0.25 mg Oral BID  . docusate sodium  100 mg Oral BID  . feeding supplement (ENSURE ENLIVE)  237 mL Oral TID BM  . fesoterodine  4 mg Oral Daily  . furosemide  60 mg Intravenous Once  . hydrALAZINE  50 mg Oral Q8H  . insulin aspart  0-9 Units Subcutaneous Q4H  . insulin glargine  5 Units Subcutaneous QHS  . iron polysaccharides  150 mg Oral TID  . levETIRAcetam  250 mg Oral BID  . Melatonin  5 mg Oral QHS  . metoprolol tartrate  25 mg Oral BID  . nitrofurantoin  100 mg Oral Daily  . OLANZapine  10 mg Oral QHS  . pantoprazole  40 mg Oral BID  . torsemide  40 mg Oral Daily   acetaminophen **OR** acetaminophen, albuterol, ondansetron  Assessment/ Plan:  [name] is a 77 y.o. white female with congestive heart failure, hypertension, diabetes mellitus type II, obstructive sleep apnea, nephrolithiasis who has been admitted to [facility] for acute renal failure, hyperkalemia,  Taking potassium chloride PO at home.  2 D Echo 07/05/18: LVEF 56-38%, Grade 2 diastolic dysfunction, Systolic pressure was moderately to severely   increased pulmonary artery systolic pressure is 75-64 mmHg.  1. Acute Renal Failure: on chronic kidney disease stage 4 Possibly new baseline creatinine of 1.99/GFR 23 from July 12, 2018 2. Hyperkalemia 3. Lower extremity edema 4.  Diabetes mellitus type II with chronic kidney disease: hemoglobin A1c 7.6% on 04/22/18  Plan Discontinue potassium supplementation and do not restart as outpatient No ACE-I/ARB due to ARF, hyperkalemia. Continue torsemide as prescribed Supportive care Avoid hypotension   LOS: 0 Derricka Mertz 10/18/20194:43 PM

## 2018-08-04 NOTE — Care Management Obs Status (Signed)
[facility] NOTIFICATION   Patient Details  Name: CARMELL ELGIN MRN: [id number] Date of Birth: [birth year]   Medicare Observation Status Notification Given:  Yes    Elza Rafter, RN 08/04/2018, 5:01 PM

## 2018-08-04 NOTE — H&P (Signed)
Carolyn Valdez is an 77 y.o. female.   Chief Complaint: Weakness  HPI: Patient described becoming weak when trying to transfer out of bed, leading to shortness of breath.  EMS responded noting pt.'s SpO2 at 80% on room air.  Patient reports fatigue and shortness of breath with activity needing to take more rest breaks when using her walker over the past week.  Patient also reports noticing increased bilateral lower extremity swelling, and reports chronic intermittent nausea with vomiting episodes Wednesday and today.  Patient reports using 2L-3L [place], and that hospice is not currently assisting her at home due to an eligibility issue.  ED physician performed digital rectal exam noting melena stool.  Hospitalist consulted for admission.  Past Medical History:  Diagnosis Date  . Anxiety   . Breast cancer ([place]) 1997   right breast mastectomy  . CHF (congestive heart failure) ([place])   . Diabetes mellitus without complication ([place])   . DJD (degenerative joint disease)   . Dysuria   . H/O total knee replacement    right  . HTN (hypertension)   . Hypercholesteremia   . Kidney stone   . Obesity   . Recurrent urinary tract infection   . SVT (supraventricular tachycardia) ([place])   . Urinary incontinence   . Vaginal atrophy   . Yeast vaginitis     Past Surgical History:  Procedure Laterality Date  . CARDIAC CATHETERIZATION    . CHOLECYSTECTOMY    . FOOT SURGERY    . JOINT REPLACEMENT    . MASTECTOMY Right 1997  . NASAL SEPTUM SURGERY    . PERIPHERAL VASCULAR CATHETERIZATION N/A 07/08/2015   Procedure: PICC Line Insertion;  Surgeon: Algernon Huxley, MD;  Location: [facility];  Service: Cardiovascular;  Laterality: N/A;  . TONSILLECTOMY    . Vocal cords      Family History  Problem Relation Age of Onset  . Lung cancer Father   . Hematuria Mother   . Lung cancer Mother   . Kidney disease Neg Hx   . Bladder Cancer Neg Hx   . Breast cancer Neg Hx    Social History:  reports that she  has quit smoking. She quit smokeless tobacco use about 22 years ago. She reports that she does not drink alcohol or use drugs.  Allergies:  Allergies  Allergen Reactions  . Sulfa Antibiotics Hives  . Biaxin [Clarithromycin] Hives  . Influenza A (H1n1) [place] Vac Other (See Comments)    Pt states that she was told by her MD not to get the influenza vaccine.    . Morphine Other (See Comments)    Reaction:  Dizziness and confusion   . Pyridium [Phenazopyridine Hcl] Other (See Comments)    Reaction:  Unknown   . Ceftriaxone Anxiety  . Latex Rash  . Prednisone Rash  . Tape Rash    Medications Prior to Admission  Medication Sig Dispense Refill  . albuterol (PROVENTIL HFA;VENTOLIN HFA) 108 (90 Base) MCG/ACT inhaler Inhale 2 puffs into the lungs every 6 (six) hours as needed for wheezing or shortness of breath. 1 Inhaler 1  . citalopram (CELEXA) 10 MG tablet Take 1 tablet (10 mg total) by mouth daily. 30 tablet 0  . insulin glargine (LANTUS) 100 UNIT/ML injection Inject 0.1 mLs (10 Units total) into the skin daily. (Patient taking differently: Inject 10 Units into the skin at bedtime. ) 100 mL 0  . levETIRAcetam (KEPPRA) 250 MG tablet Take 1 tablet (250 mg total) by  mouth 2 (two) times daily. 30 tablet 0  . metFORMIN (GLUCOPHAGE) 500 MG tablet Take 1,000 mg by mouth 2 (two) times daily.    . metoprolol tartrate (LOPRESSOR) 25 MG tablet Take 1 tablet (25 mg total) by mouth 2 (two) times daily. 60 tablet 0  . nitrofurantoin (MACRODANTIN) 100 MG capsule Take 1 capsule (100 mg total) by mouth daily. 30 capsule 11  . OLANZapine (ZYPREXA) 10 MG tablet Take 10 mg by mouth at bedtime.    . ondansetron (ZOFRAN-ODT) 8 MG disintegrating tablet Take 8 mg by mouth every 8 (eight) hours as needed for nausea/vomiting.    . pantoprazole (PROTONIX) 40 MG tablet Take 40 mg by mouth 2 (two) times daily.    Marland Kitchen torsemide (DEMADEX) 20 MG tablet Take 2 tablets (40 mg total) by mouth daily. 30 tablet 0  .  acetaminophen (TYLENOL) 325 MG tablet Take 2 tablets (650 mg total) by mouth every 6 (six) hours as needed for mild pain (or Fever >/= 101).    Marland Kitchen acidophilus (RISAQUAD) CAPS capsule Take 1 capsule by mouth at bedtime.    Marland Kitchen aspirin EC 81 MG EC tablet Take 1 tablet (81 mg total) by mouth daily. 30 tablet 0  . clonazePAM (KLONOPIN) 0.25 MG disintegrating tablet Take 1 tablet (0.25 mg total) by mouth 2 (two) times daily. 60 tablet 0  . docusate sodium (COLACE) 100 MG capsule Take 100 mg by mouth 2 (two) times daily.     . feeding supplement, ENSURE ENLIVE, (ENSURE ENLIVE) LIQD Take 237 mLs by mouth 3 (three) times daily between meals. (Patient not taking: Reported on 04/22/2018) 237 mL 12  . fluticasone (FLONASE) 50 MCG/ACT nasal spray Place 2 sprays into both nostrils daily.    . hydrALAZINE (APRESOLINE) 50 MG tablet Take 1 tablet (50 mg total) by mouth every 8 (eight) hours. 90 tablet 0  . insulin glargine (LANTUS) 100 UNIT/ML injection Inject 0.15 mLs (15 Units total) into the skin at bedtime. 100 mL 1  . iron polysaccharides (NIFEREX) 150 MG capsule Take 150 mg by mouth 3 (three) times daily.     Marland Kitchen lamoTRIgine (LAMICTAL) 25 MG tablet Take 1 tablet (25 mg total) by mouth daily. 30 tablet 0  . loratadine (CLARITIN) 10 MG tablet Take 10 mg by mouth daily.    . Melatonin 3 MG TABS Take 6 mg by mouth at bedtime.    . polyethylene glycol (MIRALAX / GLYCOLAX) packet Take 17 g by mouth daily. 14 each 0  . potassium chloride 20 MEQ TBCR Take 20 mEq by mouth daily. 30 tablet 0  . senna-docusate (SENOKOT-S) 8.6-50 MG tablet Take 1 tablet by mouth at bedtime as needed for mild constipation.    Marland Kitchen tolterodine (DETROL LA) 4 MG 24 hr capsule Take 4 mg by mouth daily.      Results for orders placed or performed during the hospital encounter of 08/03/18 (from the past 48 hour(s))  Comprehensive metabolic panel     Status: Abnormal   Collection Time: 08/03/18 11:40 PM  Result Value Ref Range   Sodium 136 135 - 145  mmol/L   Potassium 6.3 (HH) 3.5 - 5.1 mmol/L    Comment: CRITICAL RESULT CALLED TO, READ BACK BY AND VERIFIED WITH JENNIFER INGERSOL ON 08/04/18 AT 0031 [facility]    Chloride 104 98 - 111 mmol/L   CO2 25 22 - 32 mmol/L   Glucose, Bld 175 (H) 70 - 99 mg/dL   BUN 52 (H) 8 - 23  mg/dL   Creatinine, Ser 2.80 (H) 0.44 - 1.00 mg/dL   Calcium 9.2 8.9 - 10.3 mg/dL   Total Protein 6.9 6.5 - 8.1 g/dL   Albumin 3.7 3.5 - 5.0 g/dL   AST 43 (H) 15 - 41 U/L   ALT 17 0 - 44 U/L   Alkaline Phosphatase 82 38 - 126 U/L   Total Bilirubin 0.8 0.3 - 1.2 mg/dL   GFR calc non Af Amer 15 (L) >60 mL/min   GFR calc Af Amer 18 (L) >60 mL/min    Comment: (NOTE) The eGFR has been calculated using the CKD EPI equation. This calculation has not been validated in all clinical situations. eGFR's persistently <60 mL/min signify possible Chronic Kidney Disease.    Anion gap 7 5 - 15    Comment: Performed at [facility], [address] [place], [place] [address]  CBC with Differential     Status: Abnormal   Collection Time: 08/03/18 11:40 PM  Result Value Ref Range   WBC 6.7 4.0 - 10.5 K/uL   RBC 2.43 (L) 3.87 - 5.11 MIL/uL   Hemoglobin 7.0 (L) 12.0 - 15.0 g/dL   HCT 23.6 (L) 36.0 - 46.0 %   MCV 97.1 80.0 - 100.0 fL   MCH 28.8 26.0 - 34.0 pg   MCHC 29.7 (L) 30.0 - 36.0 g/dL   RDW 16.0 (H) 11.5 - 15.5 %   Platelets 231 150 - 400 K/uL   nRBC 0.0 0.0 - 0.2 %   Neutrophils Relative % 69 %   Neutro Abs 4.6 1.7 - 7.7 K/uL   Lymphocytes Relative 15 %   Lymphs Abs 1.0 0.7 - 4.0 K/uL   Monocytes Relative 10 %   Monocytes Absolute 0.7 0.1 - 1.0 K/uL   Eosinophils Relative 5 %   Eosinophils Absolute 0.3 0.0 - 0.5 K/uL   Basophils Relative 0 %   Basophils Absolute 0.0 0.0 - 0.1 K/uL   Immature Granulocytes 1 %   Abs Immature Granulocytes 0.07 0.00 - 0.07 K/uL    Comment: Performed at [facility], [address] [place], [place] [address]  Troponin I     Status: None   Collection Time: 08/03/18  11:40 PM  Result Value Ref Range   Troponin I <0.03 <0.03 ng/mL    Comment: Performed at [facility], [address] [place], [place] [address]  Brain natriuretic peptide     Status: Abnormal   Collection Time: 08/03/18 11:40 PM  Result Value Ref Range   B Natriuretic Peptide 677.0 (H) 0.0 - 100.0 pg/mL    Comment: Performed at [facility], [address] [place], [place] [address]  Type and screen [place]     Status: None (Preliminary result)   Collection Time: 08/03/18 11:40 PM  Result Value Ref Range   ABO/RH(D) O POS    Antibody Screen NEG    Sample Expiration 08/06/2018    Unit Number [id number]    Blood Component Type RED CELLS,LR    Unit division 00    Status of Unit ISSUED    Transfusion Status OK TO TRANSFUSE    Crossmatch Result      Compatible Performed at [facility], [address] [place], [place] [address]    Unit Number [id number]    Blood Component Type RED CELLS,LR    Unit division 00    Status of Unit ALLOCATED    Transfusion Status OK TO TRANSFUSE  Crossmatch Result Compatible   Prepare RBC     Status: None   Collection Time: 08/04/18 12:44 AM  Result Value Ref Range   Order Confirmation      ORDER PROCESSED BY BLOOD BANK Performed at [facility], [address] [place], [place] [address]    Dg Chest Port 1 View  Result Date: 08/03/2018 CLINICAL DATA:  Hypoxia and shortness of breath. EXAM: PORTABLE CHEST 1 VIEW COMPARISON:  Radiograph 07/07/2018 FINDINGS: Cardiomegaly is unchanged from prior exam. Unchanged mediastinal contours. Aortic atherosclerosis. Improved pulmonary edema from prior exam with residual or recurrent vascular congestion. Streaky retrocardiac opacity has also improved from prior. No large pleural effusion. No pneumothorax. The bones are under mineralized. IMPRESSION: Cardiomegaly with vascular congestion. Improved pulmonary edema from prior exam. Persistent  but improved streaky retrocardiac opacity is likely atelectasis. Electronically Signed   By: Keith Rake M.D.   On: 08/03/2018 23:55    Review of Systems  Constitutional: Positive for malaise/fatigue. Negative for chills and fever.  HENT: Negative for congestion, sore throat and tinnitus.   Eyes: Negative for blurred vision and redness.  Respiratory: Positive for shortness of breath. Negative for cough.   Cardiovascular: Negative for chest pain, palpitations, orthopnea and PND.  Gastrointestinal: Negative for abdominal pain, diarrhea, nausea and vomiting.       Pt. States BM's are baseline dark r/t iron   Genitourinary: Negative for dysuria, frequency and urgency.  Musculoskeletal: Negative for joint pain and myalgias.  Skin: Negative for rash.       No lesions  Neurological: Negative for speech change, focal weakness and weakness.  Endo/Heme/Allergies: Does not bruise/bleed easily.       No temperature intolerance  Psychiatric/Behavioral: Negative for depression and suicidal ideas.    Blood pressure (!) 183/83, pulse 74, temperature 98.3 F (36.8 C), temperature source Oral, resp. rate 15, height 5' 8"  (1.727 m), weight 116.3 kg, SpO2 100 %. Physical Exam  Vitals reviewed. Constitutional: She is oriented to person, place, and time. She appears well-developed and well-nourished. No distress.  HENT:  Head: Normocephalic and atraumatic.  Mouth/Throat: Oropharynx is clear and moist.  Eyes: Pupils are equal, round, and reactive to light. Conjunctivae and EOM are normal. No scleral icterus.  Neck: Normal range of motion. Neck supple. No JVD present. No tracheal deviation present. No thyromegaly present.  Cardiovascular: Normal rate and regular rhythm. Exam reveals no gallop and no friction rub.  Murmur heard. Respiratory: Effort normal and breath sounds normal.  GI: Soft. Bowel sounds are normal. She exhibits no distension. There is no tenderness.  Genitourinary:  Genitourinary  Comments: Deferred   Musculoskeletal: Normal range of motion. She exhibits edema.  Bilateral lower extremities   Lymphadenopathy:    She has no cervical adenopathy.  Neurological: She is alert and oriented to person, place, and time. No cranial nerve deficit. She exhibits normal muscle tone.  Skin: Skin is warm and dry. No rash noted. No erythema.  Psychiatric: She has a normal mood and affect. Her behavior is normal. Judgment and thought content normal.     Assessment/Plan Principal Problem:  Symptomatic Anemia: Patient ordered 2 units of RBC's, will recheck H & H post transfusion, GI consulted.  Active Problems: Chronic Kidney Disease stage III:  Avoid nephrotoxic agents, hydrate with intravenous fluid, monitor kidney function- consult nephrology if worsening renal function.  Congestive Heart Failure:  Continued home medications.  Hypertension: continue home medications.  Hyperkalemia: Patient received IV insulin and an amp of D50, will  recheck post blood transfusion, no acute changes noted on EKG.  Diabetes Mellitus, type 2:  Ordered sensitive sliding scale and blood sugar checks every 4 hours, home Lantus ordered at 5 units.  Iron Deficiency Anemia: Iron supplementation per home regimen continued.  Harrie Foreman, MD 08/04/2018, 3:39 AM

## 2018-08-04 NOTE — Consult Note (Signed)
[name], MD [address], [place], [place], [place], [address] [address] [place], [place], [place], [address] Phone: [phone]  Fax: [phone]  Consultation  Referring Provider:     Dr. Darvin Neighbours Primary Care Physician:  Leonel Ramsay, MD Reason for Consultation:     Anemia  Date of Admission:  08/03/2018 Date of Consultation:  08/04/2018         HPI:   Carolyn Valdez is a 77 y.o. female with history of CHF, breast cancer, presented with weakness for the last 2 to 3 days, shortness of breath, lower extremity edema, found to be hypoxic on presentation, and also found to have decreased hemoglobin at 7, with last hemoglobin check on September 25 showing hemoglobin of 8.3 at the time.  Patient is on iron replacement at home, and rectal exam in the ED reportedly showed melena.  Reports nausea and vomiting for the last 2 weeks.  No hematemesis or hematochezia.  Patient reports having black stools daily due to iron, and this is not new for her.  Patient had a colonoscopy in September 2015 due to heme positive stool and iron deficiency anemia by Dr. Gustavo Lah.  This reported diverticulosis.  And also inflammation in the descending colon attribute it to ischemic colitis at the time.  CTA was recommended.  EGD at the same time showed bile gastritis, and duodenal diverticulum.  CTA report as follows: IMPRESSION:  There is no evidence of significant mesenteric artery stenosis or  thrombosis. No evidence of abdominal aortic aneurysm or dissection  is noted. No significant renal artery stenosis is noted. There is no  definite evidence of ischemic colitis seen on this exam.   Pathology report as follows DIAGNOSIS:  A. COLON POLYP, TRANSVERSE; COLD BIOPSY:  - HYPERPLASTIC POLYP.  - NEGATIVE FOR DYSPLASIA AND MALIGNANCY.   B. COLON, TRANSVERSE, DISTAL; COLD BIOPSY:  - COLONIC MUCOSA WITH FOCAL ISCHEMIC CHANGE.  - NEGATIVE FOR DYSPLASIA AND MALIGNANCY.   C. COLON,  DESCENDING, DISTAL; BIOPSY:  - COLONIC MUCOSA WITH ISCHEMIC COLITIS.  - NEGATIVE FOR VIRUS CYTOPATHIC EFFECT, DYSPLASIA, AND MALIGNANCY.   D. COLON, SIGMOID, PROXIMAL; COLD BIOPSY:  - UNREMARKABLE COLONIC MUCOSA.   E. ATYPICAL MUCOSA AT 43 CM FOLD; BIOPSY:  - COLONIC MUCOSA WITH FOCAL HYPERPLASTIC EPITHELIAL CHANGE.  - NEGATIVE FOR DYSPLASIA AND MALIGNANCY.   A. STOMACH, ANTRUM; BIOPSY:  -ANTRAL MUCOSA WITH MILD CHRONIC GASTRITIS.  -NEGATIVE FOR H PYLORI, DYSPLASIA AND MALIGNANCY.   B. STOMACH, BODY; BIOPSY:  - OXYNTIC MUCOSA WITH MILD CHRONIC ACTIVE GASTRITIS.  - NEGATIVE FOR DYSPLASIA AND MALIGNANCY.   Colonoscopy in 2008 for rectal bleeding showed internal hemorrhoids EGD 2008 done for heartburn showed gastric erythema  Colonoscopy 2005 for rectal bleeding showed diverticulosis, and two 6 mm sigmoid polyp  Past Medical History:  Diagnosis Date  . Anxiety   . Breast cancer ([facility]) 1997   right breast mastectomy  . CHF (congestive heart failure) ([facility])   . Diabetes mellitus without complication ([facility])   . DJD (degenerative joint disease)   . Dysuria   . H/O total knee replacement    right  . HTN (hypertension)   . Hypercholesteremia   . Kidney stone   . Obesity   . Recurrent urinary tract infection   . SVT (supraventricular tachycardia) ([facility])   . Urinary incontinence   . Vaginal atrophy   . Yeast vaginitis     Past Surgical History:  Procedure Laterality Date  . CARDIAC CATHETERIZATION    .  CHOLECYSTECTOMY    . FOOT SURGERY    . JOINT REPLACEMENT    . MASTECTOMY Right 1997  . NASAL SEPTUM SURGERY    . PERIPHERAL VASCULAR CATHETERIZATION N/A 07/08/2015   Procedure: PICC Line Insertion;  Surgeon: Algernon Huxley, MD;  Location: [facility];  Service: Cardiovascular;  Laterality: N/A;  . TONSILLECTOMY    . Vocal cords      Prior to Admission medications   Medication Sig Start Date End Date Taking? Authorizing Provider  albuterol (PROVENTIL HFA;VENTOLIN HFA)  108 (90 Base) MCG/ACT inhaler Inhale 2 puffs into the lungs every 6 (six) hours as needed for wheezing or shortness of breath. 07/14/18  Yes Gouru, Illene Silver, MD  citalopram (CELEXA) 10 MG tablet Take 1 tablet (10 mg total) by mouth daily. 02/04/17  Yes Wieting, Richard, MD  insulin glargine (LANTUS) 100 UNIT/ML injection Inject 0.1 mLs (10 Units total) into the skin daily. Patient taking differently: Inject 10 Units into the skin at bedtime.  07/15/18  Yes Gouru, Illene Silver, MD  levETIRAcetam (KEPPRA) 250 MG tablet Take 1 tablet (250 mg total) by mouth 2 (two) times daily. 10/26/16  Yes Epifanio Lesches, MD  metFORMIN (GLUCOPHAGE) 500 MG tablet Take 1,000 mg by mouth 2 (two) times daily. 07/27/18  Yes [provider]  metoprolol tartrate (LOPRESSOR) 25 MG tablet Take 1 tablet (25 mg total) by mouth 2 (two) times daily. 07/14/18  Yes Gouru, Illene Silver, MD  nitrofurantoin (MACRODANTIN) 100 MG capsule Take 1 capsule (100 mg total) by mouth daily. 02/06/18  Yes MacDiarmid, Nicki Reaper, MD  OLANZapine (ZYPREXA) 10 MG tablet Take 10 mg by mouth at bedtime. 08/01/18  Yes [provider]  ondansetron (ZOFRAN-ODT) 8 MG disintegrating tablet Take 8 mg by mouth every 8 (eight) hours as needed for nausea/vomiting. 08/02/18  Yes [provider]  pantoprazole (PROTONIX) 40 MG tablet Take 40 mg by mouth 2 (two) times daily. 02/09/17  Yes [provider]  torsemide (DEMADEX) 20 MG tablet Take 2 tablets (40 mg total) by mouth daily. 07/15/18  Yes Gouru, Illene Silver, MD  acetaminophen (TYLENOL) 325 MG tablet Take 2 tablets (650 mg total) by mouth every 6 (six) hours as needed for mild pain (or Fever >/= 101). 04/26/18   Loletha Grayer, MD  acidophilus (RISAQUAD) CAPS capsule Take 1 capsule by mouth at bedtime.    [provider]  aspirin EC 81 MG EC tablet Take 1 tablet (81 mg total) by mouth daily. 04/26/18   Loletha Grayer, MD  clonazePAM (KLONOPIN) 0.25 MG disintegrating tablet Take 1 tablet (0.25  mg total) by mouth 2 (two) times daily. 07/14/18   Nicholes Mango, MD  docusate sodium (COLACE) 100 MG capsule Take 100 mg by mouth 2 (two) times daily.     [provider]  feeding supplement, ENSURE ENLIVE, (ENSURE ENLIVE) LIQD Take 237 mLs by mouth 3 (three) times daily between meals. Patient not taking: Reported on 04/22/2018 10/26/16   Epifanio Lesches, MD  fluticasone [facility]) 50 MCG/ACT nasal spray Place 2 sprays into both nostrils daily.    [provider]  hydrALAZINE (APRESOLINE) 50 MG tablet Take 1 tablet (50 mg total) by mouth every 8 (eight) hours. 04/26/18   Loletha Grayer, MD  insulin glargine (LANTUS) 100 UNIT/ML injection Inject 0.15 mLs (15 Units total) into the skin at bedtime. 07/14/18   Nicholes Mango, MD  iron polysaccharides (NIFEREX) 150 MG capsule Take 150 mg by mouth 3 (three) times daily.     [provider]  lamoTRIgine (LAMICTAL) 25 MG tablet Take 1 tablet (25 mg total) by mouth daily. 04/26/18   Loletha Grayer, MD  loratadine (CLARITIN) 10 MG tablet Take 10 mg by mouth daily.    [provider]  Melatonin 3 MG TABS Take 6 mg by mouth at bedtime.    [provider]  polyethylene glycol (MIRALAX / GLYCOLAX) packet Take 17 g by mouth daily. 04/26/18   Loletha Grayer, MD  potassium chloride 20 MEQ TBCR Take 20 mEq by mouth daily. 07/14/18   Gouru, Illene Silver, MD  senna-docusate (SENOKOT-S) 8.6-50 MG tablet Take 1 tablet by mouth at bedtime as needed for mild constipation. 07/14/18   Gouru, Illene Silver, MD  tolterodine (DETROL LA) 4 MG 24 hr capsule Take 4 mg by mouth daily.    [provider]    Family History  Problem Relation Age of Onset  . Lung cancer Father   . Hematuria Mother   . Lung cancer Mother   . Kidney disease Neg Hx   . Bladder Cancer Neg Hx   . Breast cancer Neg Hx      Social History   Tobacco Use  . Smoking status: Former Research scientist (life sciences)  . Smokeless tobacco: Former Systems developer    Quit date: 10/29/1995  Substance Use  Topics  . Alcohol use: No    Alcohol/week: 0.0 standard drinks  . Drug use: No    Allergies as of 08/03/2018 - Review Complete 08/03/2018  Allergen Reaction Noted  . Sulfa antibiotics Hives 03/13/2015  . Biaxin [clarithromycin] Hives 04/17/2015  . Influenza a (h1n1) monoval vac Other (See Comments) 05/07/2015  . Morphine Other (See Comments) 03/13/2015  . Pyridium [phenazopyridine hcl] Other (See Comments) 04/17/2015  . Ceftriaxone Anxiety 03/13/2015  . Latex Rash 03/13/2015  . Prednisone Rash 03/13/2015  . Tape Rash 03/13/2015    Review of Systems:    All systems reviewed and negative except where noted in HPI.   Physical Exam:  Vital signs in last 24 hours: Vitals:   08/04/18 0542 08/04/18 0629 08/04/18 0823 08/04/18 0839  BP: (!) 164/62 (!) 164/65 (!) 146/60 (!) 160/66  Pulse: 73 72 72 72  Resp: 18 18 20  (!) 24  Temp: 98.3 F (36.8 C) 98.4 F (36.9 C) 98.5 F (36.9 C) 98 F (36.7 C)  TempSrc: Oral Oral Oral Oral  SpO2: 98% 100% 100% 100%  Weight: 120.6 kg     Height: 5\' 7"  (1.702 m)        General:   Pleasant, cooperative in NAD Head:  Normocephalic and atraumatic. Eyes:   No icterus.   Conjunctiva pink. PERRLA. Ears:  Normal auditory acuity. Neck:  Supple; no masses or thyroidomegaly Lungs: Respirations even and unlabored.  Coarse breath sounds bilaterally.  Cardiac: 2+ lower extremity edema bilaterally  abdomen:  Soft, nondistended, nontender. Normal bowel sounds. No appreciable masses or hepatomegaly.  No rebound or guarding.  Rectal: Black hard stool on digital rectal exam, no bright red blood Neurologic:  Alert and oriented x3;  grossly normal neurologically. Skin:  Intact without significant lesions or rashes. Cervical Nodes:  No significant cervical adenopathy. Psych:  Alert and cooperative. Normal affect.  LAB RESULTS: Recent Labs    08/03/18 2340  WBC 6.7  HGB 7.0*  HCT 23.6*  PLT 231   BMET Recent Labs    08/03/18 2340  NA 136  K 6.3*    CL 104  CO2 25  GLUCOSE 175*  BUN 52*  CREATININE 2.80*  CALCIUM 9.2  LFT Recent Labs    08/03/18 2340  PROT 6.9  ALBUMIN 3.7  AST 43*  ALT 17  ALKPHOS 82  BILITOT 0.8   PT/INR No results for input(s): LABPROT, INR in the last 72 hours.  STUDIES: Dg Chest Port 1 View  Result Date: 08/03/2018 CLINICAL DATA:  Hypoxia and shortness of breath. EXAM: PORTABLE CHEST 1 VIEW COMPARISON:  Radiograph 07/07/2018 FINDINGS: Cardiomegaly is unchanged from prior exam. Unchanged mediastinal contours. Aortic atherosclerosis. Improved pulmonary edema from prior exam with residual or recurrent vascular congestion. Streaky retrocardiac opacity has also improved from prior. No large pleural effusion. No pneumothorax. The bones are under mineralized. IMPRESSION: Cardiomegaly with vascular congestion. Improved pulmonary edema from prior exam. Persistent but improved streaky retrocardiac opacity is likely atelectasis. Electronically Signed   By: Keith Rake M.D.   On: 08/03/2018 23:55      Impression / Plan:   Carolyn Valdez is a 77 y.o. y/o female with history of CHF, admitted with weakness, found to be hypoxic, hyperkalemic, and found to have decrease in hemoglobin to 7  Patient has had a declining hemoglobin for the last 1 to 2 months. Her black stool at home on iron use, and her rectal exam showing black stool, which does not appear sticky or tarry that is does not appear like, is consistent with her black stool likely being from her iron use  However, she has had a declining hemoglobin which needs further evaluation Would recommend ruling out any other causes of time  please obtain UA  Patient is hyperkalemic, and is requiring nasal cannula, and while she is being turned to her side for patient care by her nursing staff, she appears short of breath   therefore, she is currently not medically optimized to tolerate endoscopic procedures with sedation safely Discussed the procedures with  sedation can lead to further hypoxia in the setting  Given that she is hemodynamically stable, would recommend medical optimization prior to any endoscopic procedures.  Risks of procedure would outweigh benefits in the setting of hemodynamic stability acute GI bleeding at this time until she is medically optimized  EGD and/or colonoscopy in 1 to 2 days depending on sensation of shortness of breath and hyperkalemia Clear liquid diet okay  N.p.o. past midnight  PPI IV twice daily  Continue serial CBCs and transfuse PRN Avoid NSAIDs Maintain 2 large-bore IV lines Please page GI with any acute hemodynamic changes, or signs of active GI bleeding    Thank you for involving me in the care of this patient.      LOS: 0 days   Virgel Manifold, MD  08/04/2018, 11:33 AM

## 2018-08-04 NOTE — ED Notes (Addendum)
Attempted to get patient to commode in room to give urine sample. Patient was unable to wait long enough to get cords untangled to get to commode, had incontinent episode. Patient's brief changed, patient cleaned. Placed sacral pad on patient, as well as barrier cream to patient's right groin d/t redness.

## 2018-08-04 NOTE — Progress Notes (Signed)
Please note, patient is currently followed by outpatient Palliative at home. CMRN Geoffry Paradise made aware.  Flo Shanks RN, BSN, [facility], hospital Liaison  [phone]

## 2018-08-04 NOTE — Consult Note (Signed)
Consultation Note Date: 08/04/2018   Patient Name: Carolyn Valdez  DOB: 04-Nov-1940  MRN: [id number]  Age / Sex: 77 y.o., female  PCP: Leonel Ramsay, MD Referring Physician: Hillary Bow, MD  Reason for Consultation: Establishing goals of care  HPI/Patient Profile: 77 y.o. female  with past medical history of DM, HLD, HTN, CHF, breast CA, and anxiety admitted on 08/03/2018 with weakness and shortness of breath.  Patient was trying to transfer out of bed and was too weak and became short of breath. EMS noted that patient's SpO2 was 80% on room air. Apparently, patient had lower activity tolerance and more shortness of breath over the past week. She also noticed increase swelling in lower extremities. Melena stool noted during DRE. Labs found hgb 7.0. She received 2 units of PRBC. Follow up hgb up to 8.9. Creatinine found to be 2.80 and potassium 6.3. PMT consulted because patient was recently referred to hospice.  Clinical Assessment and Goals of Care: I have reviewed medical records including EPIC notes, labs and imaging, received report from Dr. Darvin Neighbours, assessed the patient and then met with patient  to discuss diagnosis prognosis, [place], EOL wishes, disposition and options.  Patient was oriented; however, she did become easily confused and occasionally drifted to sleep during conversation. She was able to talk about her goals of care.   I introduced Palliative Medicine as specialized medical care for people living with serious illness. It focuses on providing relief from the symptoms and stress of a serious illness. The goal is to improve quality of life for both the patient and the family.   We discussed her current illness and what it means in the larger context of her on-going co-morbidities. Patient had good understanding of reason for hospitalization and plan of care.   We talked about hospice and patient was upset by the topic. She tells me  she was referred to hospice but then told she was ineligible. After thorough conversation and discussion with hospice liaison, it appears patient's goals of care were not in line with hospice philosophy. Patient desired to continue aggressive medical therapies and wanted to pursue offered care to prolong life.   We discussed her current goals of care and she reiterates that her focus is life prolongation.  The difference between aggressive medical intervention and comfort care was considered in light of the patient's goals of care. She shares that she wants to pursue evaluation of GI bleed including EGD/colonoscopy. She also shares that she wants to continue to come to the hospital as needed for treatment.   Advance directives, concepts specific to code status, artifical feeding and hydration, and rehospitalization were considered and discussed. Patient is DNR and her HCPOA is her sister [name] [name]) SAYTKZ.   Hospice and Palliative Care services outpatient were explained and offered. Patient will continue with outpatient palliative care.  Questions and concerns were addressed. The family was encouraged to call with questions or concerns.   Primary Decision Maker PATIENT  HCPOA - [name] [name]) [place] if patient is unable    SUMMARY OF RECOMMENDATIONS   - patient interested in continuing aggressive treatment options - goals of care do not align with hospice philosophy of care which makes her ineligible for hospice - continue outpatient palliative care - PMT will see patient again next week, if she is still hospitalized, to continue [place] discussions/potentially include sister in discussion  Code Status/Advance Care Planning:  DNR  Symptom Management:   Per primary  Palliative Prophylaxis:  Aspiration, Delirium Protocol, Oral Care and Turn Reposition  Additional Recommendations (Limitations, Scope, Preferences):  Full Scope Treatment  Psycho-social/Spiritual:   Desire for  further Chaplaincy support:no  Additional Recommendations: Education on Hospice  Prognosis:   Unable to determine  Discharge Planning: Home with Palliative Services      Primary Diagnoses: Present on Admission: . Iron deficiency anemia . Hyperkalemia . CKD (chronic kidney disease), stage III ([place])   I have reviewed the medical record, interviewed the patient and family, and examined the patient. The following aspects are pertinent.  Past Medical History:  Diagnosis Date  . Anxiety   . Breast cancer ([place] [place]) 1997   right breast mastectomy  . CHF (congestive heart failure) ([facility])   . Diabetes mellitus without complication ([place])   . DJD (degenerative joint disease)   . Dysuria   . H/O total knee replacement    right  . HTN (hypertension)   . Hypercholesteremia   . Kidney stone   . Obesity   . Recurrent urinary tract infection   . SVT (supraventricular tachycardia) ([facility])   . Urinary incontinence   . Vaginal atrophy   . Yeast vaginitis    Social History   Socioeconomic History  . Marital status: Widowed    Spouse name: Not on file  . Number of children: Not on file  . Years of education: Not on file  . Highest education level: Not on file  Occupational History  . Not on file  Social Needs  . Financial resource strain: Not on file  . Food insecurity:    Worry: Not on file    Inability: Not on file  . Transportation needs:    Medical: Not on file    Non-medical: Not on file  Tobacco Use  . Smoking status: Former Research scientist (life sciences)  . Smokeless tobacco: Former Systems developer    Quit date: 10/29/1995  Substance and Sexual Activity  . Alcohol use: No    Alcohol/week: 0.0 standard drinks  . Drug use: No  . Sexual activity: Never  Lifestyle  . Physical activity:    Days per week: Not on file    Minutes per session: Not on file  . Stress: Not on file  Relationships  . Social connections:    Talks on phone: Not on file    Gets together: Not on file    Attends religious service:  Not on file    Active member of club or organization: Not on file    Attends meetings of clubs or organizations: Not on file    Relationship status: Not on file  Other Topics Concern  . Not on file  Social History Narrative  . Not on file   Family History  Problem Relation Age of Onset  . Lung cancer Father   . Hematuria Mother   . Lung cancer Mother   . Kidney disease Neg Hx   . Bladder Cancer Neg Hx   . Breast cancer Neg Hx    Scheduled Meds: . citalopram  10 mg Oral Daily  . clonazePAM  0.25 mg Oral BID  . docusate sodium  100 mg Oral BID  . feeding supplement (ENSURE ENLIVE)  237 mL Oral TID BM  . fesoterodine  4 mg Oral Daily  . furosemide  60 mg Intravenous Once  . hydrALAZINE  50 mg Oral Q8H  . insulin aspart  0-9 Units Subcutaneous Q4H  . insulin glargine  5 Units Subcutaneous QHS  . iron polysaccharides  150 mg Oral TID  .  levETIRAcetam  250 mg Oral BID  . Melatonin  5 mg Oral QHS  . metoprolol tartrate  25 mg Oral BID  . nitrofurantoin  100 mg Oral Daily  . OLANZapine  10 mg Oral QHS  . pantoprazole  40 mg Oral BID  . torsemide  40 mg Oral Daily   Continuous Infusions: PRN Meds:.acetaminophen **OR** acetaminophen, albuterol, ondansetron Allergies  Allergen Reactions  . Sulfa Antibiotics Hives  . Biaxin [Clarithromycin] Hives  . Influenza A (H1n1) Monoval Vac Other (See Comments)    Pt states that she was told by her MD not to get the influenza vaccine.    . Morphine Other (See Comments)    Reaction:  Dizziness and confusion   . Pyridium [Phenazopyridine Hcl] Other (See Comments)    Reaction:  Unknown   . Ceftriaxone Anxiety  . Latex Rash  . Prednisone Rash  . Tape Rash   Review of Systems  Constitutional: Positive for activity change and fatigue. Negative for appetite change.  Respiratory: Positive for shortness of breath.   Cardiovascular: Positive for leg swelling.  Neurological: Positive for weakness.  All other systems reviewed and are  negative.   Physical Exam  Constitutional: She is oriented to person, place, and time. She appears well-developed and well-nourished. No distress.  HENT:  Head: Normocephalic and atraumatic.  Cardiovascular: Normal rate and regular rhythm.  Pulmonary/Chest: Effort normal and breath sounds normal. No respiratory distress.  Abdominal: Soft.  Musculoskeletal: She exhibits edema.  Neurological: She is oriented to person, place, and time.  Periods of confusion, drifting off to sleep  Skin: She is not diaphoretic. There is pallor.    Vital Signs: BP (!) 172/72 (BP Location: Left Arm)   Pulse 67   Temp 97.6 F (36.4 C) (Oral)   Resp (!) 22   Ht 5' 7"  (1.702 m)   Wt 120.6 kg   SpO2 95%   BMI 41.65 kg/m  Pain Scale: 0-10   Pain Score: 0-No pain   SpO2: SpO2: 95 % O2 Device:SpO2: 95 % O2 Flow Rate: .O2 Flow Rate (L/min): 3 L/min  IO: Intake/output summary:   Intake/Output Summary (Last 24 hours) at 08/04/2018 1631 Last data filed at 08/04/2018 1537 Gross per 24 hour  Intake 720 ml  Output 1100 ml  Net -380 ml    LBM: Last BM Date: 08/03/18 Baseline Weight: Weight: 116.3 kg Most recent weight: Weight: 120.6 kg     Palliative Assessment/Data: PPS 50%    Time Total: 60 minutes Greater than 50%  of this time was spent counseling and coordinating care related to the above assessment and plan.  Juel Burrow, DNP, AGNP-C Palliative Medicine Team [phone] Pager: [phone]

## 2018-08-04 NOTE — ED Notes (Signed)
Date and time results received: 08/04/18 0031   Test: Potassium Critical Value: 6.3  Name of Provider Notified: Dr. Mable Paris  Orders Received? Or Actions Taken?: Acknowledged, new orders pending.

## 2018-08-04 NOTE — ED Notes (Signed)
Patient belongings sent to vault:  Safety alert necklace Gold colored necklace with 3 clear stones Gold colored necklace with circular shape setting of clear stones (11 small clear stones with 1 clear stone in the middle) Gold colored necklace with heart shaped pendant with clear stones Gold colored ring with aquamarine colored stone and clear stones around Gold colored band ring with clear stones Gold colored ring with middle clear stone and 6 smaller clear stones Silver colored ring with multiple clear stones Gold colored pair of earrings with clear stones

## 2018-08-05 DIAGNOSIS — D649 Anemia, unspecified: Secondary | ICD-10-CM

## 2018-08-05 LAB — CBC WITH DIFFERENTIAL/PLATELET
Abs Immature Granulocytes: 0.11 10*3/uL — ABNORMAL HIGH (ref 0.00–0.07)
BASOS PCT: 1 %
Basophils Absolute: 0.1 10*3/uL (ref 0.0–0.1)
EOS ABS: 0.6 10*3/uL — AB (ref 0.0–0.5)
EOS PCT: 7 %
HCT: 31.4 % — ABNORMAL LOW (ref 36.0–46.0)
HEMOGLOBIN: 9.7 g/dL — AB (ref 12.0–15.0)
Immature Granulocytes: 1 %
Lymphocytes Relative: 9 %
Lymphs Abs: 0.9 10*3/uL (ref 0.7–4.0)
MCH: 29.1 pg (ref 26.0–34.0)
MCHC: 30.9 g/dL (ref 30.0–36.0)
MCV: 94.3 fL (ref 80.0–100.0)
MONO ABS: 1.1 10*3/uL — AB (ref 0.1–1.0)
MONOS PCT: 12 %
Neutro Abs: 6.7 10*3/uL (ref 1.7–7.7)
Neutrophils Relative %: 70 %
Platelets: 245 10*3/uL (ref 150–400)
RBC: 3.33 MIL/uL — AB (ref 3.87–5.11)
RDW: 16.6 % — AB (ref 11.5–15.5)
WBC: 9.5 10*3/uL (ref 4.0–10.5)
nRBC: 0 % (ref 0.0–0.2)

## 2018-08-05 LAB — COMPREHENSIVE METABOLIC PANEL
ALK PHOS: 76 U/L (ref 38–126)
ALT: 17 U/L (ref 0–44)
ANION GAP: 11 (ref 5–15)
AST: 21 U/L (ref 15–41)
Albumin: 3.7 g/dL (ref 3.5–5.0)
BUN: 39 mg/dL — ABNORMAL HIGH (ref 8–23)
CALCIUM: 9.7 mg/dL (ref 8.9–10.3)
CO2: 28 mmol/L (ref 22–32)
Chloride: 99 mmol/L (ref 98–111)
Creatinine, Ser: 2.33 mg/dL — ABNORMAL HIGH (ref 0.44–1.00)
GFR calc non Af Amer: 19 mL/min — ABNORMAL LOW (ref >60)
GFR, EST AFRICAN AMERICAN: 22 mL/min — AB (ref >60)
Glucose, Bld: 220 mg/dL — ABNORMAL HIGH (ref 70–99)
POTASSIUM: 5.4 mmol/L — AB (ref 3.5–5.1)
SODIUM: 138 mmol/L (ref 135–145)
TOTAL PROTEIN: 7.3 g/dL (ref 6.5–8.1)
Total Bilirubin: 0.9 mg/dL (ref 0.3–1.2)

## 2018-08-05 LAB — TYPE AND SCREEN
ABO/RH(D): O POS
ANTIBODY SCREEN: NEGATIVE
UNIT DIVISION: 0
Unit division: 0

## 2018-08-05 LAB — BPAM RBC
BLOOD PRODUCT EXPIRATION DATE: 201910252359
Blood Product Expiration Date: 201910252359
ISSUE DATE / TIME: 201910180321
ISSUE DATE / TIME: 201910180813
UNIT TYPE AND RH: 5100
Unit Type and Rh: 5100

## 2018-08-05 LAB — GLUCOSE, CAPILLARY
Glucose-Capillary: 177 mg/dL — ABNORMAL HIGH (ref 70–99)
Glucose-Capillary: 192 mg/dL — ABNORMAL HIGH (ref 70–99)
Glucose-Capillary: 195 mg/dL — ABNORMAL HIGH (ref 70–99)
Glucose-Capillary: 202 mg/dL — ABNORMAL HIGH (ref 70–99)
Glucose-Capillary: 248 mg/dL — ABNORMAL HIGH (ref 70–99)
Glucose-Capillary: 260 mg/dL — ABNORMAL HIGH (ref 70–99)

## 2018-08-05 MED ORDER — PROMETHAZINE HCL 25 MG PO TABS
12.5000 mg | ORAL_TABLET | Freq: Four times a day (QID) | ORAL | Status: DC | PRN
  Administered 2018-08-05: 12.5 mg via ORAL
  Filled 2018-08-05 (×4): qty 1

## 2018-08-05 MED ORDER — BISACODYL 5 MG PO TBEC
10.0000 mg | DELAYED_RELEASE_TABLET | Freq: Once | ORAL | Status: AC
  Administered 2018-08-05: 10 mg via ORAL
  Filled 2018-08-05: qty 2

## 2018-08-05 MED ORDER — FUROSEMIDE 10 MG/ML IJ SOLN
60.0000 mg | Freq: Once | INTRAMUSCULAR | Status: AC
  Administered 2018-08-05: 60 mg via INTRAVENOUS
  Filled 2018-08-05: qty 6

## 2018-08-05 MED ORDER — SODIUM POLYSTYRENE SULFONATE 15 GM/60ML PO SUSP
30.0000 g | Freq: Once | ORAL | Status: AC
  Administered 2018-08-05: 30 g via ORAL
  Filled 2018-08-05: qty 120

## 2018-08-05 MED ORDER — PANTOPRAZOLE SODIUM 40 MG IV SOLR
40.0000 mg | Freq: Two times a day (BID) | INTRAVENOUS | Status: DC
  Administered 2018-08-05 – 2018-08-07 (×5): 40 mg via INTRAVENOUS
  Filled 2018-08-05 (×5): qty 40

## 2018-08-05 MED ORDER — PEG 3350-KCL-NA BICARB-NACL 420 G PO SOLR
4000.0000 mL | Freq: Once | ORAL | Status: AC
  Administered 2018-08-05: 4000 mL via ORAL
  Filled 2018-08-05: qty 4000

## 2018-08-05 NOTE — Progress Notes (Signed)
PT Cancellation Note  Patient Details Name: Carolyn Valdez MRN: [id number] DOB: 11-17-1940   Cancelled Treatment:    Reason Eval/Treat Not Completed: Patient not medically ready.  Order received.  Chart reviewed.  Pt's last K+ lab 5.4.  Per RN, this is downtrending and pt has been administered medication in the past hour.  Requested that PT come back after lunch.  Will re-attempt later if pt appropriate.   Roxanne Gates, PT, DPT 08/05/2018, 11:06 AM

## 2018-08-05 NOTE — Progress Notes (Signed)
[facility] NAME: Noemie Devivo    MR#:  [id number]  DATE OF BIRTH:  10-15-1941  SUBJECTIVE:  CHIEF COMPLAINT:  No chief complaint on file.  Shortness of breath present but some improvement since yesterday.  Coughing.  No abdominal pain.  REVIEW OF SYSTEMS:    Review of Systems  Constitutional: Positive for malaise/fatigue. Negative for chills and fever.  HENT: Negative for sore throat.   Eyes: Negative for blurred vision, double vision and pain.  Respiratory: Positive for shortness of breath. Negative for cough, hemoptysis and wheezing.   Cardiovascular: Positive for orthopnea and leg swelling. Negative for chest pain and palpitations.  Gastrointestinal: Negative for abdominal pain, constipation, diarrhea, heartburn, nausea and vomiting.  Genitourinary: Negative for dysuria and hematuria.  Musculoskeletal: Negative for back pain and joint pain.  Skin: Negative for rash.  Neurological: Negative for sensory change, speech change, focal weakness and headaches.  Endo/Heme/Allergies: Does not bruise/bleed easily.  Psychiatric/Behavioral: Negative for depression. The patient is not nervous/anxious.     DRUG ALLERGIES:   Allergies  Allergen Reactions  . Sulfa Antibiotics Hives  . Biaxin [Clarithromycin] Hives  . Influenza A (H1n1) Monoval Vac Other (See Comments)    Pt states that she was told by her MD not to get the influenza vaccine.    . Morphine Other (See Comments)    Reaction:  Dizziness and confusion   . Pyridium [Phenazopyridine Hcl] Other (See Comments)    Reaction:  Unknown   . Ceftriaxone Anxiety  . Latex Rash  . Prednisone Rash  . Tape Rash    VITALS:  Blood pressure (!) 182/69, pulse 73, temperature 99.1 F (37.3 C), temperature source Oral, resp. rate 20, height 5\' 7"  (1.702 m), weight 119.1 kg, SpO2 96 %.  PHYSICAL EXAMINATION:   Physical Exam  GENERAL:  77 y.o.-year-old patient lying in the bed.  Obese.   Conversational dyspnea EYES: Pupils equal, round, reactive to light and accommodation. No scleral icterus. Extraocular muscles intact.  Pallor positive HEENT: Head atraumatic, normocephalic. Oropharynx and nasopharynx clear.  NECK:  Supple, no jugular venous distention. No thyroid enlargement, no tenderness.  LUNGS: Basilar crackles CARDIOVASCULAR: S1, S2 normal. No murmurs, rubs, or gallops.  ABDOMEN: Soft, nontender, nondistended. Bowel sounds present. No organomegaly or mass.  EXTREMITIES: No cyanosis, clubbing .  Bilateral lower extremity edema NEUROLOGIC: Cranial nerves II through XII are intact. No focal Motor or sensory deficits b/l.   PSYCHIATRIC: The patient is alert and oriented x 3.  SKIN: No obvious rash, lesion, or ulcer.   LABORATORY PANEL:   CBC Recent Labs  Lab 08/05/18 0809  WBC 9.5  HGB 9.7*  HCT 31.4*  PLT 245   ------------------------------------------------------------------------------------------------------------------ Chemistries  Recent Labs  Lab 08/05/18 0809  NA 138  K 5.4*  CL 99  CO2 28  GLUCOSE 220*  BUN 39*  CREATININE 2.33*  CALCIUM 9.7  AST 21  ALT 17  ALKPHOS 76  BILITOT 0.9   ------------------------------------------------------------------------------------------------------------------  Cardiac Enzymes Recent Labs  Lab 08/03/18 2340  TROPONINI <0.03   ------------------------------------------------------------------------------------------------------------------  RADIOLOGY:  Dg Chest Port 1 View  Result Date: 08/03/2018 CLINICAL DATA:  Hypoxia and shortness of breath. EXAM: PORTABLE CHEST 1 VIEW COMPARISON:  Radiograph 07/07/2018 FINDINGS: Cardiomegaly is unchanged from prior exam. Unchanged mediastinal contours. Aortic atherosclerosis. Improved pulmonary edema from prior exam with residual or recurrent vascular congestion. Streaky retrocardiac opacity has also improved from prior. No large pleural effusion. No  pneumothorax.  The bones are under mineralized. IMPRESSION: Cardiomegaly with vascular congestion. Improved pulmonary edema from prior exam. Persistent but improved streaky retrocardiac opacity is likely atelectasis. Electronically Signed   By: Keith Rake M.D.   On: 08/03/2018 23:55     ASSESSMENT AND PLAN:   *Acute on chronic diastolic congestive heart failure Lasix Monitor input and output.  Repeat labs in the morning. Needs further inpatient diuresis.  *AKI likely secondary to cardiorenal syndrome.  Discussed with Dr. Juleen China of nephrology.  *Acute on chronic hypoxic respiratory failure is slowly improving. Wean oxygen as tolerated  *Hyperkalemia is improving  *Acute blood loss anemia over chronic anemia secondary to GI bleed with melena.  GI on board and considering endoscopy procedures once patient stabilized.  All the records are reviewed and case discussed with Care Management/Social Workerr. Management plans discussed with the patient, family and they are in agreement.  CODE STATUS: DO NOT RESUSCITATE  DVT Prophylaxis: SCDs  TOTAL TIME TAKING CARE OF THIS PATIENT: 35 minutes.   POSSIBLE D/C IN 2-3 DAYS, DEPENDING ON CLINICAL CONDITION.  Leia Alf Candi Profit M.D on 08/05/2018 at 12:10 PM  Between 7am to 6pm - Pager - [phone]  After 6pm go to [email or web address] - password [email or web address] [facility]  [employer]  Hospitalists  Office  [phone]  CC: Primary care physician; Leonel Ramsay, MD  Note: This dictation was prepared with Dragon dictation along with smaller phrase technology. Any transcriptional errors that result from this process are unintentional.

## 2018-08-05 NOTE — Progress Notes (Signed)
PT Cancellation Note  Patient Details Name: Carolyn Valdez MRN: [id number] DOB: October 05, 1941   Cancelled Treatment:    Reason Eval/Treat Not Completed: Patient declined, no reason specified.  Order received.  Chart reviewed.  Pt appeared very lethargic and requested PT to return tomorrow, stating that she has been nauseated.  PT will re-attempt when pt is more appropriate.   Roxanne Gates, PT, DPT 08/05/2018, 2:35 PM

## 2018-08-05 NOTE — Progress Notes (Signed)
Advance care planning  Purpose of Encounter Congestive heart failure, anemia, CKD  Parties in Attendance Patient  Patients Decisional capacity Patient is alert and oriented.  Able to make medical decisions  Discussed with patient regarding her congestive heart failure, anemia and further work-up.  Patient was recently discharged from the hospital with hospice services.  Was discharged from hospice services due to patient's goals of care being aggressive including dialysis if needed.  Today we discussed about her acute kidney injury and need for hemodialysis if any worsening.  Patient would want hemodialysis if needed.  At this time she wants aggressive care for her congestive heart failure, anemia, CKD.  CODE STATUS DO NOT RESUSCITATE and DO NOT INTUBATE.  Time spent - 18 minutes

## 2018-08-05 NOTE — Progress Notes (Signed)
Carolyn Antigua, MD [address], [place], [place], [place], [address] [address], [place], [place], [place], [address] Phone: [phone]  Fax: [phone]   Subjective:  Patient denies any signs of bleeding, no epistaxis, no hematuria, no melena or hematochezia, no hematemesis.  No abdominal pain.  States shortness of breath with little better.  Still requiring oxygen via nasal cannula.  Objective: Exam: Vital signs in last 24 hours: Vitals:   08/04/18 2024 08/05/18 0353 08/05/18 0408 08/05/18 0753  BP: (!) 163/68 (!) 172/63 (!) 173/67 (!) 182/69  Pulse: 64 70 70 73  Resp: 16 16  20   Temp: 98.4 F (36.9 C) 97.7 F (36.5 C) 98.2 F (36.8 C) 99.1 F (37.3 C)  TempSrc: Oral Oral Oral Oral  SpO2: 98% 98%  96%  Weight:  119.1 kg    Height:       Weight change: 2.769 kg  Intake/Output Summary (Last 24 hours) at 08/05/2018 0935 Last data filed at 08/05/2018 0408 Gross per 24 hour  Intake -  Output 4050 ml  Net -4050 ml    General: No acute distress, AAO x3 Abd: Soft, NT/ND, No HSM Skin: Warm, no rashes Neck: Supple, Trachea midline   Lab Results: Lab Results  Component Value Date   WBC 9.5 08/05/2018   HGB 9.7 (L) 08/05/2018   HCT 31.4 (L) 08/05/2018   MCV 94.3 08/05/2018   PLT 245 08/05/2018   Micro Results: No results found for this or any previous visit (from the past 240 hour(s)). Studies/Results: Dg Chest Port 1 View  Result Date: 08/03/2018 CLINICAL DATA:  Hypoxia and shortness of breath. EXAM: PORTABLE CHEST 1 VIEW COMPARISON:  Radiograph 07/07/2018 FINDINGS: Cardiomegaly is unchanged from prior exam. Unchanged mediastinal contours. Aortic atherosclerosis. Improved pulmonary edema from prior exam with residual or recurrent vascular congestion. Streaky retrocardiac opacity has also improved from prior. No large pleural effusion. No pneumothorax. The bones are under mineralized. IMPRESSION: Cardiomegaly with vascular congestion. Improved  pulmonary edema from prior exam. Persistent but improved streaky retrocardiac opacity is likely atelectasis. Electronically Signed   By: Keith Rake M.D.   On: 08/03/2018 23:55   Medications:  Scheduled Meds: . citalopram  10 mg Oral Daily  . clonazePAM  0.25 mg Oral BID  . docusate sodium  100 mg Oral BID  . feeding supplement (ENSURE ENLIVE)  237 mL Oral TID BM  . fesoterodine  4 mg Oral Daily  . hydrALAZINE  50 mg Oral Q8H  . insulin aspart  0-9 Units Subcutaneous Q6H  . insulin glargine  5 Units Subcutaneous QHS  . iron polysaccharides  150 mg Oral TID  . levETIRAcetam  250 mg Oral BID  . Melatonin  5 mg Oral QHS  . metoprolol tartrate  25 mg Oral BID  . nitrofurantoin  100 mg Oral Daily  . OLANZapine  10 mg Oral QHS  . pantoprazole  40 mg Oral BID  . torsemide  40 mg Oral Daily   Continuous Infusions: PRN Meds:.acetaminophen **OR** acetaminophen, albuterol, ondansetron   Assessment: Principal Problem:   Symptomatic anemia Active Problems:   Iron deficiency anemia   Diabetes mellitus, type 2 (HCC)   Hypertension   Congestive heart failure (HCC)   Hyperkalemia   CKD (chronic kidney disease), stage III ([place])    Plan: Hyperkalemia improved, renal following Hgb stable We will plan on EGD and colonoscopy tomorrow as long as patient is able to complete the prep.  If patient is on complete the prep,  procedures can be done today after. No active signs of GI bleeding at this time, no indication for emergent procedures today  Continue to medical only optimize patient but treating her CHF  PPI IV twice daily  Continue serial CBCs and transfuse PRN Avoid NSAIDs Maintain 2 large-bore IV lines Please page GI with any acute hemodynamic changes, or signs of active GI bleeding  I have discussed alternative options, risks & benefits,  which include, but are not limited to, bleeding, infection, perforation,respiratory complication & drug reaction.  The patient agrees with  this plan & written consent will be obtained.       LOS: 0 days   Carolyn Antigua, MD 08/05/2018, 9:35 AM

## 2018-08-05 NOTE — Progress Notes (Signed)
Pt ;unable to take golytely prep because of nausea. Medicated with phenergan, but pt still unable to take the prep.

## 2018-08-05 NOTE — Progress Notes (Signed)
[facility]  ROUNDING NOTE   Subjective:   Dalaney Needle Gritz admitted to [facility] on 08/03/2018 for Hyperkalemia [E87.5] Acute kidney injury [facility]) [N17.9] Acute on chronic respiratory failure with hypoxia (HCC) [J96.21] Symptomatic anemia [D64.9] Gastrointestinal hemorrhage, unspecified gastrointestinal hemorrhage type [K92.2]   Patient complains of nausea.   Objective:  Vital signs in last 24 hours:  Temp:  [97.6 F (36.4 C)-99.1 F (37.3 C)] 99.1 F (37.3 C) (10/19 0753) Pulse Rate:  [64-73] 73 (10/19 0753) Resp:  [16-22] 20 (10/19 0753) BP: (163-182)/(63-72) 182/69 (10/19 0753) SpO2:  [95 %-100 %] 96 % (10/19 0753) Weight:  [119.1 kg] 119.1 kg (10/19 0353)  Weight change: 2.769 kg Filed Weights   08/03/18 2329 08/04/18 0542 08/05/18 0353  Weight: 116.3 kg 120.6 kg 119.1 kg    Intake/Output: I/O last 3 completed shifts: In: 720 [Blood:720] Out: 6295 [Urine:4050]   Intake/Output this shift:  No intake/output data recorded.  Physical Exam: General: NAD,   Head: Normocephalic, atraumatic. Moist oral mucosal membranes  Eyes: Anicteric, PERRL  Neck: Supple, trachea midline  Lungs:  Clear to auscultation  Heart: Regular rate and rhythm  Abdomen:  Soft, nontender, obese  Extremities:  no peripheral edema.  Neurologic: Nonfocal, moving all four extremities  Skin: No lesions        Basic Metabolic Panel: Recent Labs  Lab 08/03/18 2340 08/04/18 1252 08/04/18 1655 08/05/18 0809  NA 136 139 137 138  K 6.3* 5.7* 5.9* 5.4*  CL 104 104 105 99  CO2 25 27 25 28   GLUCOSE 175* 160* 155* 220*  BUN 52* 45* 43* 39*  CREATININE 2.80* 2.49* 2.51* 2.33*  CALCIUM 9.2 9.3 9.3 9.7    Liver Function Tests: Recent Labs  Lab 08/03/18 2340 08/05/18 0809  AST 43* 21  ALT 17 17  ALKPHOS 82 76  BILITOT 0.8 0.9  PROT 6.9 7.3  ALBUMIN 3.7 3.7   No results for input(s): LIPASE, AMYLASE in the last 168 hours. No results for input(s): AMMONIA in the last 168  hours.  CBC: Recent Labs  Lab 08/03/18 2340 08/04/18 1252 08/04/18 1655 08/05/18 0809  WBC 6.7 6.9  --  9.5  NEUTROABS 4.6  --   --  6.7  HGB 7.0* 8.9* 8.9* 9.7*  HCT 23.6* 29.1* 28.7* 31.4*  MCV 97.1 93.6  --  94.3  PLT 231 222  --  245    Cardiac Enzymes: Recent Labs  Lab 08/03/18 2340  TROPONINI <0.03    BNP: Invalid input(s): POCBNP  CBG: Recent Labs  Lab 08/04/18 1621 08/04/18 2021 08/04/18 2349 08/05/18 0236 08/05/18 0800  GLUCAP 80* 106* 61* 59* 64*    Microbiology: Results for orders placed or performed during the hospital encounter of 07/04/18  MRSA PCR Screening     Status: None   Collection Time: 07/04/18 10:10 PM  Result Value Ref Range Status   MRSA by PCR NEGATIVE NEGATIVE Final    Comment:        The GeneXpert MRSA Assay (FDA approved for NASAL specimens only), is one component of a comprehensive MRSA colonization surveillance program. It is not intended to diagnose MRSA infection nor to guide or monitor treatment for MRSA infections. Performed at [facility], [address] [place], [place] [address]   Culture, Urine     Status: Abnormal   Collection Time: 07/05/18  5:13 AM  Result Value Ref Range Status   Specimen Description   Final    URINE, RANDOM Performed at  [facility], [address], [place], [place] [address]    Special Requests   Final    NONE Performed at [facility], [place], [place] [address]    Culture 30,000 COLONIES/mL ENTEROCOCCUS FAECALIS (A)  Final   Report Status 07/07/2018 FINAL  Final   Organism ID, Bacteria ENTEROCOCCUS FAECALIS (A)  Final      Susceptibility   Enterococcus faecalis - MIC*    AMPICILLIN <=2 SENSITIVE Sensitive     LEVOFLOXACIN >=8 RESISTANT Resistant     NITROFURANTOIN 64 INTERMEDIATE Intermediate     VANCOMYCIN 1 SENSITIVE Sensitive     * 30,000 COLONIES/mL ENTEROCOCCUS FAECALIS    Coagulation Studies: No results for input(s):  LABPROT, INR in the last 72 hours.  Urinalysis: Recent Labs    08/04/18 0419 08/04/18 1542  COLORURINE RED* YELLOW*  LABSPEC 1.008 1.009  PHURINE 5.0 5.0  GLUCOSEU NEGATIVE NEGATIVE  HGBUR LARGE* MODERATE*  BILIRUBINUR NEGATIVE NEGATIVE  KETONESUR NEGATIVE NEGATIVE  PROTEINUR 100* 100*  NITRITE NEGATIVE NEGATIVE  LEUKOCYTESUR LARGE* NEGATIVE      Imaging: Dg Chest Port 1 View  Result Date: 08/03/2018 CLINICAL DATA:  Hypoxia and shortness of breath. EXAM: PORTABLE CHEST 1 VIEW COMPARISON:  Radiograph 07/07/2018 FINDINGS: Cardiomegaly is unchanged from prior exam. Unchanged mediastinal contours. Aortic atherosclerosis. Improved pulmonary edema from prior exam with residual or recurrent vascular congestion. Streaky retrocardiac opacity has also improved from prior. No large pleural effusion. No pneumothorax. The bones are under mineralized. IMPRESSION: Cardiomegaly with vascular congestion. Improved pulmonary edema from prior exam. Persistent but improved streaky retrocardiac opacity is likely atelectasis. Electronically Signed   By: Keith Rake M.D.   On: 08/03/2018 23:55     Medications:    . bisacodyl  10 mg Oral Once  . citalopram  10 mg Oral Daily  . clonazePAM  0.25 mg Oral BID  . docusate sodium  100 mg Oral BID  . feeding supplement (ENSURE ENLIVE)  237 mL Oral TID BM  . fesoterodine  4 mg Oral Daily  . hydrALAZINE  50 mg Oral Q8H  . insulin aspart  0-9 Units Subcutaneous Q6H  . insulin glargine  5 Units Subcutaneous QHS  . iron polysaccharides  150 mg Oral TID  . levETIRAcetam  250 mg Oral BID  . Melatonin  5 mg Oral QHS  . metoprolol tartrate  25 mg Oral BID  . nitrofurantoin  100 mg Oral Daily  . OLANZapine  10 mg Oral QHS  . pantoprazole (PROTONIX) IV  40 mg Intravenous Q12H  . polyethylene glycol-electrolytes  4,000 mL Oral Once  . torsemide  40 mg Oral Daily   acetaminophen **OR** acetaminophen, albuterol, ondansetron  Assessment/ Plan:  Ms. ALEXZANDREA NORMINGTON is a 77 y.o. white female with congestive heart failure, hypertension, diabetes mellitus type II, obstructive sleep apnea, nephrolithiasis who has been admitted to [facility] on 08/03/2018 for Hyperkalemia [E87.5] Acute kidney injury ([facility]) [N17.9] Acute on chronic respiratory failure with hypoxia (HCC) [J96.21] Symptomatic anemia [D64.9] Gastrointestinal hemorrhage, unspecified gastrointestinal hemorrhage type [K92.2]  1. 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  Secondary to acute cardiorenal syndrome. 2. Hyperkalemia 3. Metabolic Acidosis 4. Chronic diastolic congestive failure 5. Normocytic Anemia with chronic kidney disease 6. Hyperphosphatemia 7. Diabetes mellitus type II with chronic kidney disease  Plan Continue torsemide 40 mg daily AM  Hold metformin Hold potassium - do not reorder Palliative care   -  Continue supportive care   LOS: 0 Dai Mcadams 10/19/201910:09 AM

## 2018-08-06 ENCOUNTER — Observation Stay: Payer: Medicare Other | Admitting: Anesthesiology

## 2018-08-06 ENCOUNTER — Encounter: Payer: Self-pay | Admitting: Anesthesiology

## 2018-08-06 ENCOUNTER — Encounter
Admission: EM | Disposition: A | Discharge status: Home Health | Payer: Self-pay | Source: Home / Self Care | Attending: Internal Medicine

## 2018-08-06 DIAGNOSIS — N390 Urinary tract infection, site not specified: Secondary | ICD-10-CM | POA: Diagnosis present

## 2018-08-06 DIAGNOSIS — I130 Hypertensive heart and chronic kidney disease with heart failure and stage 1 through stage 4 chronic kidney disease, or unspecified chronic kidney disease: Secondary | ICD-10-CM | POA: Diagnosis present

## 2018-08-06 DIAGNOSIS — Z7951 Long term (current) use of inhaled steroids: Secondary | ICD-10-CM | POA: Diagnosis not present

## 2018-08-06 DIAGNOSIS — D62 Acute posthemorrhagic anemia: Secondary | ICD-10-CM | POA: Diagnosis present

## 2018-08-06 DIAGNOSIS — E875 Hyperkalemia: Secondary | ICD-10-CM | POA: Diagnosis present

## 2018-08-06 DIAGNOSIS — Z7982 Long term (current) use of aspirin: Secondary | ICD-10-CM | POA: Diagnosis not present

## 2018-08-06 DIAGNOSIS — N184 Chronic kidney disease, stage 4 (severe): Secondary | ICD-10-CM | POA: Diagnosis present

## 2018-08-06 DIAGNOSIS — K31819 Angiodysplasia of stomach and duodenum without bleeding: Secondary | ICD-10-CM

## 2018-08-06 DIAGNOSIS — Z9011 Acquired absence of right breast and nipple: Secondary | ICD-10-CM | POA: Diagnosis not present

## 2018-08-06 DIAGNOSIS — E7800 Pure hypercholesterolemia, unspecified: Secondary | ICD-10-CM | POA: Diagnosis present

## 2018-08-06 DIAGNOSIS — Z853 Personal history of malignant neoplasm of breast: Secondary | ICD-10-CM | POA: Diagnosis not present

## 2018-08-06 DIAGNOSIS — K2901 Acute gastritis with bleeding: Secondary | ICD-10-CM

## 2018-08-06 DIAGNOSIS — D649 Anemia, unspecified: Secondary | ICD-10-CM | POA: Diagnosis present

## 2018-08-06 DIAGNOSIS — K3189 Other diseases of stomach and duodenum: Secondary | ICD-10-CM

## 2018-08-06 DIAGNOSIS — F419 Anxiety disorder, unspecified: Secondary | ICD-10-CM | POA: Diagnosis present

## 2018-08-06 DIAGNOSIS — N179 Acute kidney failure, unspecified: Secondary | ICD-10-CM | POA: Diagnosis present

## 2018-08-06 DIAGNOSIS — E1122 Type 2 diabetes mellitus with diabetic chronic kidney disease: Secondary | ICD-10-CM | POA: Diagnosis present

## 2018-08-06 DIAGNOSIS — Z87891 Personal history of nicotine dependence: Secondary | ICD-10-CM | POA: Diagnosis not present

## 2018-08-06 DIAGNOSIS — D500 Iron deficiency anemia secondary to blood loss (chronic): Secondary | ICD-10-CM | POA: Diagnosis not present

## 2018-08-06 DIAGNOSIS — K921 Melena: Secondary | ICD-10-CM | POA: Diagnosis present

## 2018-08-06 DIAGNOSIS — J9621 Acute and chronic respiratory failure with hypoxia: Secondary | ICD-10-CM | POA: Diagnosis present

## 2018-08-06 DIAGNOSIS — K31811 Angiodysplasia of stomach and duodenum with bleeding: Secondary | ICD-10-CM | POA: Diagnosis present

## 2018-08-06 DIAGNOSIS — Z6841 Body Mass Index (BMI) 40.0 and over, adult: Secondary | ICD-10-CM | POA: Diagnosis not present

## 2018-08-06 DIAGNOSIS — E872 Acidosis: Secondary | ICD-10-CM | POA: Diagnosis present

## 2018-08-06 DIAGNOSIS — I5033 Acute on chronic diastolic (congestive) heart failure: Secondary | ICD-10-CM | POA: Diagnosis present

## 2018-08-06 DIAGNOSIS — E785 Hyperlipidemia, unspecified: Secondary | ICD-10-CM | POA: Diagnosis present

## 2018-08-06 DIAGNOSIS — Z96651 Presence of right artificial knee joint: Secondary | ICD-10-CM | POA: Diagnosis present

## 2018-08-06 DIAGNOSIS — Z79899 Other long term (current) drug therapy: Secondary | ICD-10-CM | POA: Diagnosis not present

## 2018-08-06 DIAGNOSIS — Z66 Do not resuscitate: Secondary | ICD-10-CM | POA: Diagnosis present

## 2018-08-06 DIAGNOSIS — Z794 Long term (current) use of insulin: Secondary | ICD-10-CM | POA: Diagnosis not present

## 2018-08-06 HISTORY — PX: ESOPHAGOGASTRODUODENOSCOPY: SHX5428

## 2018-08-06 HISTORY — PX: COLONOSCOPY: SHX5424

## 2018-08-06 LAB — BASIC METABOLIC PANEL
Anion gap: 8 (ref 5–15)
BUN: 35 mg/dL — AB (ref 8–23)
CHLORIDE: 97 mmol/L — AB (ref 98–111)
CO2: 33 mmol/L — ABNORMAL HIGH (ref 22–32)
Calcium: 9 mg/dL (ref 8.9–10.3)
Creatinine, Ser: 1.94 mg/dL — ABNORMAL HIGH (ref 0.44–1.00)
GFR calc Af Amer: 28 mL/min — ABNORMAL LOW (ref >60)
GFR calc non Af Amer: 24 mL/min — ABNORMAL LOW (ref >60)
GLUCOSE: 195 mg/dL — AB (ref 70–99)
POTASSIUM: 4.3 mmol/L (ref 3.5–5.1)
Sodium: 138 mmol/L (ref 135–145)

## 2018-08-06 LAB — GLUCOSE, CAPILLARY
Glucose-Capillary: 206 mg/dL — ABNORMAL HIGH (ref 70–99)
Glucose-Capillary: 155 mg/dL — ABNORMAL HIGH (ref 70–99)
Glucose-Capillary: 164 mg/dL — ABNORMAL HIGH (ref 70–99)
Glucose-Capillary: 182 mg/dL — ABNORMAL HIGH (ref 70–99)
Glucose-Capillary: 236 mg/dL — ABNORMAL HIGH (ref 70–99)

## 2018-08-06 LAB — HEMOGLOBIN: Hemoglobin: 8.4 g/dL — ABNORMAL LOW (ref 12.0–15.0)

## 2018-08-06 SURGERY — EGD (ESOPHAGOGASTRODUODENOSCOPY)
Anesthesia: General

## 2018-08-06 MED ORDER — FENTANYL CITRATE (PF) 100 MCG/2ML IJ SOLN
25.0000 ug | INTRAMUSCULAR | Status: DC | PRN
  Administered 2018-08-06: 50 ug via INTRAVENOUS

## 2018-08-06 MED ORDER — FENTANYL CITRATE (PF) 100 MCG/2ML IJ SOLN
INTRAMUSCULAR | Status: AC
  Filled 2018-08-06: qty 2

## 2018-08-06 MED ORDER — PROPOFOL 500 MG/50ML IV EMUL
INTRAVENOUS | Status: AC
  Filled 2018-08-06: qty 50

## 2018-08-06 MED ORDER — SODIUM CHLORIDE 0.9 % IV SOLN
INTRAVENOUS | Status: DC
  Administered 2018-08-06: 08:00:00 via INTRAVENOUS

## 2018-08-06 MED ORDER — LIDOCAINE HCL 1 % IJ SOLN
INTRAMUSCULAR | Status: DC | PRN
  Administered 2018-08-06: 60 mg via INTRADERMAL

## 2018-08-06 MED ORDER — PROPOFOL 10 MG/ML IV BOLUS
INTRAVENOUS | Status: AC
  Filled 2018-08-06: qty 20

## 2018-08-06 MED ORDER — PROPOFOL 500 MG/50ML IV EMUL
INTRAVENOUS | Status: DC | PRN
  Administered 2018-08-06: 75 ug/kg/min via INTRAVENOUS

## 2018-08-06 MED ORDER — PROPOFOL 10 MG/ML IV BOLUS
INTRAVENOUS | Status: DC | PRN
  Administered 2018-08-06: 15 mg via INTRAVENOUS

## 2018-08-06 MED ORDER — PHENYLEPHRINE HCL 10 MG/ML IJ SOLN
INTRAMUSCULAR | Status: DC | PRN
  Administered 2018-08-06: 100 ug via INTRAVENOUS

## 2018-08-06 NOTE — Op Note (Signed)
[facility] Gastroenterology Patient Name: Carolyn Valdez Procedure Date: 08/06/2018 8:01 AM MRN: [id number] Account #: [id number] Date of Birth: [birth year] Admit Type: Inpatient Age: 77 Room: [facility] ENDO ROOM 4 Gender: Female Note Status: Finalized Procedure:            Colonoscopy Indications:          Iron deficiency anemia Providers:            Myosha Cuadras B. Bonna Gains MD, MD Referring MD:         Adrian Prows (Referring MD) Medicines:            Monitored Anesthesia Care Complications:        No immediate complications. Procedure:            Pre-Anesthesia Assessment:                       - Prior to the procedure, a History and Physical was                        performed, and patient medications, allergies and                        sensitivities were reviewed. The patient's tolerance of                        previous anesthesia was reviewed.                       - Prophylactic Antibiotics: The patient does not                        require prophylactic antibiotics.                       - After reviewing the risks and benefits, the patient                        was deemed in satisfactory condition to undergo the                        procedure.                       - Monitored anesthesia care was determined to be                        medically necessary for this procedure based on review                        of the patient's medical history, medications, and                        prior anesthesia history.                       - The anesthesia plan was to use monitored anesthesia                        care (MAC).                       - The risks and benefits of the procedure and the  sedation options and risks were discussed with the                        patient. All questions were answered and informed                        consent was obtained.                       - Patient identification and proposed procedure were                         verified prior to the procedure.                       - ASA Grade Assessment: IV - A patient with severe                        systemic disease that is a constant threat to life.                       After obtaining informed consent, the colonoscope was                        passed under direct vision. Throughout the procedure,                        the patient's blood pressure, pulse, and oxygen                        saturations were monitored continuously. The                        Colonoscope was introduced through the anus and                        advanced to the the sigmoid colon. The colonoscopy was                        performed with ease. The patient tolerated the                        procedure well. The quality of the bowel preparation                        was poor. Findings:      The perianal and digital rectal examinations were normal.      A large amount of solid stool was found in the rectum and in the sigmoid       colon, interfering with visualization.      There is no endoscopic evidence of red blood in the rectum and in the       sigmoid colon. Impression:           - Preparation of the colon was poor.                       - Stool in the rectum and in the sigmoid colon.                       - No specimens collected. Recommendation:       -  Check Ferritin and iron level and start IV iron if                        indicated based on iron deficiency                       - See EGD report from today for findings and details.                        (Gastritis and AVM seen and treated with APC, which is                        the possible source of patient's anemia).                       - Colonoscopy today was incomplete as pt did not drink                        her prep. Colonic lesions cannot be ruled out until                        patient undergoes full colonoscopic evaluation. Repeat                        colonoscopy tomorrow  or day after depending on when                        patient can complete her prep.                       - Continue Serial CBCs and transfuse PRN                       - Avoid NSAIDs except Aspirin if medically indicated                       - The findings and recommendations were discussed with                        the patient. Procedure Code(s):    --- Professional ---                       [phone], 25, Colonoscopy, flexible; diagnostic, including                        collection of specimen(s) by brushing or washing, when                        performed (separate procedure) Diagnosis Code(s):    --- Professional ---                       D50.9, Iron deficiency anemia, unspecified CPT copyright 2018 [employer]. All rights reserved. The codes documented in this report are preliminary and upon coder review may  be revised to meet current compliance requirements.  [name], MD Margretta Sidle B. Bonna Gains MD, MD 08/06/2018 8:54:11 AM This report has been signed electronically. Number of Addenda: 0 Note Initiated On: 08/06/2018 8:01 AM Scope Withdrawal Time: 0 hours 0 minutes 21 seconds  Total Procedure Duration: 0 hours  3 minutes 21 seconds  Estimated Blood Loss: Estimated blood loss: none.      [facility]

## 2018-08-06 NOTE — OR Nursing (Signed)
Pt transferred to PACU report given to [facility].

## 2018-08-06 NOTE — Anesthesia Preprocedure Evaluation (Addendum)
Anesthesia Evaluation  Patient identified by MRN, date of birth, ID band Patient awake    Reviewed: Allergy & Precautions, H&P , NPO status , Patient's Chart, lab work & pertinent test results  Airway Mallampati: III  TM Distance: >3 FB Neck ROM: full    Dental  (+) Teeth Intact   Pulmonary former smoker,    breath sounds clear to auscultation       Cardiovascular hypertension, +CHF  + dysrhythmias Atrial Fibrillation and Supra Ventricular Tachycardia  Rhythm:regular Rate:Normal  Echo 2/87/68: Normal systolic function, grade 2 diastolic dysfunction, MODERATE TO SEVERE PULMONARY HYPERTENSION   Neuro/Psych negative neurological ROS  negative psych ROS   GI/Hepatic negative GI ROS, Neg liver ROS,   Endo/Other  negative endocrine ROSdiabetes  Renal/GU CRFRenal disease (kidney stones)  negative genitourinary   Musculoskeletal  (+) Arthritis ,   Abdominal   Peds  Hematology  (+) Blood dyscrasia (Hgb 8.4 today), anemia ,   Anesthesia Other Findings LE edema  Past Medical History: No date: Anxiety 1997: Breast cancer ([place])     Comment:  right breast mastectomy No date: CHF (congestive heart failure) (HCC) No date: Diabetes mellitus without complication (HCC) No date: DJD (degenerative joint disease) No date: Dysuria No date: H/O total knee replacement     Comment:  right No date: HTN (hypertension) No date: Hypercholesteremia No date: Kidney stone No date: Obesity No date: Recurrent urinary tract infection No date: SVT (supraventricular tachycardia) (HCC) No date: Urinary incontinence No date: Vaginal atrophy No date: Yeast vaginitis   Reproductive/Obstetrics negative OB ROS                          Anesthesia Physical Anesthesia Plan  ASA: IV  Anesthesia Plan: General   Post-op Pain Management:    Induction:   PONV Risk Score and Plan: Propofol infusion and TIVA  Airway  Management Planned:   Additional Equipment:   Intra-op Plan:   Post-operative Plan:   Informed Consent: I have reviewed the patients History and Physical, chart, labs and discussed the procedure including the risks, benefits and alternatives for the proposed anesthesia with the patient or authorized representative who has indicated his/her understanding and acceptance.   Dental Advisory Given  Plan Discussed with: Anesthesiologist, CRNA and Surgeon  Anesthesia Plan Comments:         Anesthesia Quick Evaluation

## 2018-08-06 NOTE — Progress Notes (Signed)
PT Cancellation Note  Patient Details Name: Carolyn Valdez MRN: [id number] DOB: 10-29-1940   Cancelled Treatment:    Reason Eval/Treat Not Completed: Patient at procedure or test/unavailable(in procedure)  Patient in surgery. Will attempt again at later time.   Janna Arch, PT, DPT   08/06/2018, 9:33 AM

## 2018-08-06 NOTE — Anesthesia Post-op Follow-up Note (Signed)
Anesthesia QCDR form completed.        

## 2018-08-06 NOTE — Progress Notes (Signed)
[facility]  ROUNDING NOTE   Subjective:   Endoscopy earlier today. Large amount of stool found in colon. 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   UOP 4500  Creatinine 1.94 (2.33)  Torsemide 40mg  PO daily.   Objective:  Vital signs in last 24 hours:  Temp:  [96.9 F (36.1 C)-98.3 F (36.8 C)] 97.8 F (36.6 C) (10/20 0922) Pulse Rate:  [63-81] 80 (10/20 1009) Resp:  [15-20] 18 (10/20 1009) BP: (137-162)/(48-69) 159/69 (10/20 1009) SpO2:  [93 %-100 %] 95 % (10/20 1009) Weight:  [389 kg] 116 kg (10/20 0358)  Weight change: -3.084 kg Filed Weights   08/04/18 0542 08/05/18 0353 08/06/18 0358  Weight: 120.6 kg 119.1 kg 116 kg    Intake/Output: I/O last 3 completed shifts: In: -  Out: 3734 [Urine:6350]   Intake/Output this shift:  No intake/output data recorded.  Physical Exam: General: NAD,   Head: Normocephalic, atraumatic. Moist oral mucosal membranes  Eyes: Anicteric, PERRL  Neck: Supple, trachea midline  Lungs:  Clear to auscultation  Heart: Regular rate and rhythm  Abdomen:  Soft, nontender, obese  Extremities:  no peripheral edema.  Neurologic: Nonfocal, moving all four extremities  Skin: No lesions        Basic Metabolic Panel: Recent Labs  Lab 08/03/18 2340 08/04/18 1252 08/04/18 1655 08/05/18 0809 08/06/18 0520  NA 136 139 137 138 138  K 6.3* 5.7* 5.9* 5.4* 4.3  CL 104 104 105 99 97*  CO2 25 27 25 28  33*  GLUCOSE 175* 160* 155* 220* 195*  BUN 52* 45* 43* 39* 35*  CREATININE 2.80* 2.49* 2.51* 2.33* 1.94*  CALCIUM 9.2 9.3 9.3 9.7 9.0    Liver Function Tests: Recent Labs  Lab 08/03/18 2340 08/05/18 0809  AST 43* 21  ALT 17 17  ALKPHOS 82 76  BILITOT 0.8 0.9  PROT 6.9 7.3  ALBUMIN 3.7 3.7   No results for input(s): LIPASE, AMYLASE in the last 168 hours. No results for input(s): AMMONIA in the last 168 hours.  CBC: Recent Labs  Lab 08/03/18 2340 08/04/18 1252 08/04/18 1655 08/05/18 0809 08/06/18 0520  WBC 6.7 6.9  --  9.5  --    NEUTROABS 4.6  --   --  6.7  --   HGB 7.0* 8.9* 8.9* 9.7* 8.4*  HCT 23.6* 29.1* 28.7* 31.4*  --   MCV 97.1 93.6  --  94.3  --   PLT 231 222  --  245  --     Cardiac Enzymes: Recent Labs  Lab 08/03/18 2340  TROPONINI <0.03    BNP: Invalid input(s): POCBNP  CBG: Recent Labs  Lab 08/05/18 1150 08/05/18 1513 08/05/18 1957 08/06/18 0159 08/06/18 1022  GLUCAP 192* 260* 248* 38* 155*    Microbiology: Results for orders placed or performed during the hospital encounter of 07/04/18  MRSA PCR Screening     Status: None   Collection Time: 07/04/18 10:10 PM  Result Value Ref Range Status   MRSA by PCR NEGATIVE NEGATIVE Final    Comment:        The GeneXpert MRSA Assay (FDA approved for NASAL specimens only), is one component of a comprehensive MRSA colonization surveillance program. It is not intended to diagnose MRSA infection nor to guide or monitor treatment for MRSA infections. Performed at [facility], [address] [place], [place] [address]   Culture, Urine     Status: Abnormal   Collection Time: 07/05/18  5:13 AM  Result Value Ref Range Status  Specimen Description   Final    URINE, RANDOM Performed at [facility], [address] [place], [place] [address]    Special Requests   Final    NONE Performed at [facility], [place], [place] [address]    Culture 30,000 COLONIES/mL ENTEROCOCCUS FAECALIS (A)  Final   Report Status 07/07/2018 FINAL  Final   Organism ID, Bacteria ENTEROCOCCUS FAECALIS (A)  Final      Susceptibility   Enterococcus faecalis - MIC*    AMPICILLIN <=2 SENSITIVE Sensitive     LEVOFLOXACIN >=8 RESISTANT Resistant     NITROFURANTOIN 64 INTERMEDIATE Intermediate     VANCOMYCIN 1 SENSITIVE Sensitive     * 30,000 COLONIES/mL ENTEROCOCCUS FAECALIS    Coagulation Studies: No results for input(s): LABPROT, INR in the last 72 hours.  Urinalysis: Recent Labs    08/04/18 0419  08/04/18 1542  COLORURINE RED* YELLOW*  LABSPEC 1.008 1.009  PHURINE 5.0 5.0  GLUCOSEU NEGATIVE NEGATIVE  HGBUR LARGE* MODERATE*  BILIRUBINUR NEGATIVE NEGATIVE  KETONESUR NEGATIVE NEGATIVE  PROTEINUR 100* 100*  NITRITE NEGATIVE NEGATIVE  LEUKOCYTESUR LARGE* NEGATIVE      Imaging: No results found.   Medications:    . citalopram  10 mg Oral Daily  . clonazePAM  0.25 mg Oral BID  . docusate sodium  100 mg Oral BID  . feeding supplement (ENSURE ENLIVE)  237 mL Oral TID BM  . fentaNYL      . fesoterodine  4 mg Oral Daily  . hydrALAZINE  50 mg Oral Q8H  . insulin aspart  0-9 Units Subcutaneous Q6H  . insulin glargine  5 Units Subcutaneous QHS  . iron polysaccharides  150 mg Oral TID  . levETIRAcetam  250 mg Oral BID  . Melatonin  5 mg Oral QHS  . metoprolol tartrate  25 mg Oral BID  . nitrofurantoin  100 mg Oral Daily  . OLANZapine  10 mg Oral QHS  . pantoprazole (PROTONIX) IV  40 mg Intravenous Q12H  . torsemide  40 mg Oral Daily   acetaminophen **OR** acetaminophen, albuterol, ondansetron, promethazine  Assessment/ Plan:  Ms. WREATHA STURGEON is a 77 y.o. white female with congestive heart failure, hypertension, diabetes mellitus type II, obstructive sleep apnea, nephrolithiasis who has been admitted to [facility] on 08/03/2018 for GI bleed  1. 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  Secondary to acute cardiorenal syndrome. 2. Hyperkalemia 3. Metabolic Acidosis 4. Chronic diastolic congestive failure 5. Normocytic Anemia with chronic kidney disease 6. Hyperphosphatemia 7. Diabetes mellitus type II with chronic kidney disease 8. Urinary Tract Infection  Plan Continue torsemide 40 mg daily AM  Hold metformin Hold potassium - do not reorder Palliative care   - Continue supportive care - Nitrofurantoin is not appropriate with patient's renal function.    LOS: 0 Gae Bihl 10/20/201911:24 AM

## 2018-08-06 NOTE — Transfer of Care (Signed)
Immediate Anesthesia Transfer of Care Note  Patient: Carolyn Valdez  Procedure(s) Performed: ESOPHAGOGASTRODUODENOSCOPY (EGD) (N/A ) COLONOSCOPY (N/A )  Patient Location: PACU  Anesthesia Type:General  Level of Consciousness: awake, alert  and oriented  Airway & Oxygen Therapy: Patient Spontanous Breathing and Patient connected to nasal cannula oxygen  Post-op Assessment: Report given to RN and Post -op Vital signs reviewed and stable  Post vital signs: Reviewed and stable  Last Vitals:  Vitals Value Taken Time  BP    Temp    Pulse    Resp    SpO2      Last Pain:  Vitals:   08/06/18 0400  TempSrc: Oral  PainSc:          Complications: No apparent anesthesia complications

## 2018-08-06 NOTE — Op Note (Signed)
[facility] Patient Name: Carolyn Valdez Procedure Date: 08/06/2018 7:39 AM MRN: [id number] Account #: [id number] Date of Birth: Feb 17, 1941 Admit Type: Inpatient Age: 77 Room: [facility] ENDO ROOM 4 Gender: Female Note Status: Finalized Procedure:            Upper GI endoscopy Indications:          Iron deficiency anemia Providers:            Varnita B. Bonna Gains MD, MD Referring MD:         Adrian Prows (Referring MD) Medicines:            Monitored Anesthesia Care Complications:        No immediate complications. Procedure:            Pre-Anesthesia Assessment:                       - The risks and benefits of the procedure and the                        sedation options and risks were discussed with the                        patient. All questions were answered and informed                        consent was obtained.                       - Patient identification and proposed procedure were                        verified prior to the procedure.                       - ASA Grade Assessment: IV - A patient with severe                        systemic disease that is a constant threat to life.                       After obtaining informed consent, the endoscope was                        passed under direct vision. Throughout the procedure,                        the patient's blood pressure, pulse, and oxygen                        saturations were monitored continuously. The Endoscope                        was introduced through the mouth, and advanced to the                        second part of duodenum. The upper GI endoscopy was                        accomplished with ease. The patient tolerated the  procedure well. Findings:      The examined esophagus was normal.      Patchy mild inflammation with hemorrhage characterized by erythema and       friability was found in the gastric fundus. Coagulation for hemostasis      using argon plasma was successful.      There is no endoscopic evidence of ulceration in the entire examined       stomach.      A single 4 mm angioectasia with bleeding on contact was found in the       second portion of the duodenum. Coagulation for hemostasis using argon       plasma was successful.      A mild sharp angulation like deformity was found in the second portion       of the duodenum. The second portion of the duodenum was found at a very       sharp angulation upward from the duodenal bulb. This area could not be       examined during intubation or withdrawal due to the sharp angulation and       scope unable to stay in that position for careful examination. It was       non-obstructive as the second portion was able to be intubated. Impression:           - Normal esophagus.                       - Gastritis with hemorrhage. Treated with argon plasma                        coagulation (APC).                       - A single angioectasia in the duodenum. Treated with                        argon plasma coagulation (APC).                       - Friable gastric mucosa which bleeds on contact with                        water was noted. The bleeding areas were treated with                        APC. This along with her AVM could explain her anemia.                       - Duodenal deformity.                       - No specimens collected. Recommendation:       - Use Protonix (pantoprazole) 40 mg IV BID.                       - Obtain Upper GI study with small bowel follow through                        to evaluate area of duodenal deformity (It may be  patient's normal anatomy, and Upper GI study would                        allow to rule out a true deformity or lesion in the                        area).                       - Obtain right upper quadrant ultrasound to evaluate                        the liver to rule out cirrhosis. (This is to  evaluate                        if her gastric fundus friability is a manifestation of                        possible portal hypertensive gastropathy from                        underlying cirrhosis due to CHF. However, Her normal                        platelets and liver enzymes go against cirrhosis.                       - Continue Serial CBCs and transfuse PRN                       - See Colonoscopy report from today. Possible                        colonoscopy in 1-2 days depending on when patient can                        complete her prep and will have to be arranged around                        her radiological tests recommended above.                       - The findings and recommendations were discussed with                        the patient. Procedure Code(s):    --- Professional ---                       [phone], Esophagogastroduodenoscopy, flexible, transoral;                        with control of bleeding, any method Diagnosis Code(s):    --- Professional ---                       K29.71, Gastritis, unspecified, with bleeding                       K31.819, Angiodysplasia of stomach and duodenum without  bleeding                       K31.89, Other diseases of stomach and duodenum                       D50.9, Iron deficiency anemia, unspecified CPT copyright 2018 American Medical Association. All rights reserved. The codes documented in this report are preliminary and upon coder review may  be revised to meet current compliance requirements.  [name], MD Margretta Sidle B. Bonna Gains MD, MD 08/06/2018 9:04:05 AM This report has been signed electronically. Number of Addenda: 0 Note Initiated On: 08/06/2018 7:39 AM Estimated Blood Loss: Estimated blood loss: none.      [facility]

## 2018-08-06 NOTE — Progress Notes (Signed)
PT Cancellation Note  Patient Details Name: Carolyn Valdez MRN: [id number] DOB: [birth year]   Cancelled Treatment:    Reason Eval/Treat Not Completed: Patient declined, no reason specified(Patient upset and not willing to move due to hurting and wanting a nap) Patient unwilling to participate, became upset stating she just wants to nap because she doesn't feel good. Will attempt again tomorrow.   Janna Arch, PT, DPT   08/06/2018, 4:34 PM

## 2018-08-06 NOTE — Anesthesia Postprocedure Evaluation (Signed)
Anesthesia Post Note  Patient: MORNING HALBERG  Procedure(s) Performed: ESOPHAGOGASTRODUODENOSCOPY (EGD) (N/A ) COLONOSCOPY (N/A )  Patient location during evaluation: PACU Anesthesia Type: General Level of consciousness: awake and alert Pain management: pain level controlled Vital Signs Assessment: post-procedure vital signs reviewed and stable Respiratory status: spontaneous breathing, nonlabored ventilation, respiratory function stable and patient connected to nasal cannula oxygen Cardiovascular status: blood pressure returned to baseline and stable Postop Assessment: no apparent nausea or vomiting Anesthetic complications: no     Last Vitals:  Vitals:   08/06/18 0922 08/06/18 1009  BP: (!) 137/50 (!) 159/69  Pulse: 76 80  Resp: 20 18  Temp: 36.6 C   SpO2: 95% 95%    Last Pain:  Vitals:   08/06/18 1009  TempSrc: Oral  PainSc:                  Durenda Hurt

## 2018-08-06 NOTE — Progress Notes (Signed)
[facility] [place] NAME: Carolyn Valdez    MR#:  [id number]  DATE OF BIRTH:  [birth year]  SUBJECTIVE:  CHIEF COMPLAINT:  No chief complaint on file.  EGD earlier today with AVM. No bleeding overnight. Afebrile  REVIEW OF SYSTEMS:    Review of Systems  Constitutional: Positive for malaise/fatigue. Negative for chills and fever.  HENT: Negative for sore throat.   Eyes: Negative for blurred vision, double vision and pain.  Respiratory: Positive for shortness of breath. Negative for cough, hemoptysis and wheezing.   Cardiovascular: Positive for orthopnea and leg swelling. Negative for chest pain and palpitations.  Gastrointestinal: Negative for abdominal pain, constipation, diarrhea, heartburn, nausea and vomiting.  Genitourinary: Negative for dysuria and hematuria.  Musculoskeletal: Negative for back pain and joint pain.  Skin: Negative for rash.  Neurological: Negative for sensory change, speech change, focal weakness and headaches.  Endo/Heme/Allergies: Does not bruise/bleed easily.  Psychiatric/Behavioral: Negative for depression. The patient is not nervous/anxious.     DRUG ALLERGIES:   Allergies  Allergen Reactions  . Sulfa Antibiotics Hives  . Biaxin [Clarithromycin] Hives  . Influenza A (H1n1) Monoval Vac Other (See Comments)    Pt states that she was told by her MD not to get the influenza vaccine.    . Morphine Other (See Comments)    Reaction:  Dizziness and confusion   . Pyridium [Phenazopyridine Hcl] Other (See Comments)    Reaction:  Unknown   . Ceftriaxone Anxiety  . Latex Rash  . Prednisone Rash  . Tape Rash    VITALS:  Blood pressure (!) 159/69, pulse 80, temperature 97.8 F (36.6 C), resp. rate 18, height 5\' 7"  (1.702 m), weight 116 kg, SpO2 95 %.  PHYSICAL EXAMINATION:   Physical Exam  GENERAL:  77 y.o.-year-old patient lying in the bed.  Obese.  Conversational dyspnea EYES: Pupils equal, round, reactive to  light and accommodation. No scleral icterus. Extraocular muscles intact.  Pallor positive HEENT: Head atraumatic, normocephalic. Oropharynx and nasopharynx clear.  NECK:  Supple, no jugular venous distention. No thyroid enlargement, no tenderness.  LUNGS: Basilar crackles CARDIOVASCULAR: S1, S2 normal. No murmurs, rubs, or gallops.  ABDOMEN: Soft, nontender, nondistended. Bowel sounds present. No organomegaly or mass.  EXTREMITIES: No cyanosis, clubbing .  Bilateral lower extremity edema. NEUROLOGIC: Cranial nerves II through XII are intact. No focal Motor or sensory deficits b/l.   PSYCHIATRIC: The patient is alert and oriented x 3.  SKIN: No obvious rash, lesion, or ulcer.   LABORATORY PANEL:   CBC Recent Labs  Lab 08/05/18 0809 08/06/18 0520  WBC 9.5  --   HGB 9.7* 8.4*  HCT 31.4*  --   PLT 245  --    ------------------------------------------------------------------------------------------------------------------ Chemistries  Recent Labs  Lab 08/05/18 0809 08/06/18 0520  NA 138 138  K 5.4* 4.3  CL 99 97*  CO2 28 33*  GLUCOSE 220* 195*  BUN 39* 35*  CREATININE 2.33* 1.94*  CALCIUM 9.7 9.0  AST 21  --   ALT 17  --   ALKPHOS 76  --   BILITOT 0.9  --    ------------------------------------------------------------------------------------------------------------------  Cardiac Enzymes Recent Labs  Lab 08/03/18 2340  TROPONINI <0.03   ------------------------------------------------------------------------------------------------------------------  RADIOLOGY:  No results found.   ASSESSMENT AND PLAN:   *Acute on chronic diastolic congestive heart failure Torsemide Monitor input and output.  Repeat labs in the morning.  *AKI likely secondary to cardiorenal syndrome.  Improving Discussed with Dr.  Kolluru of nephrology.  *Acute on chronic hypoxic respiratory failure is slowly improving Wean oxygen as tolerated  *Hyperkalemia is improving  *Acute blood  loss anemia over chronic anemia secondary to GI bleed with melena.  EGD with AVM .  Cauterized.  No acute bleeding. Hemoglobin stable  All the records are reviewed and case discussed with Care Management/Social Worker Management plans discussed with the patient, family and they are in agreement.  CODE STATUS: DO NOT RESUSCITATE  DVT Prophylaxis: SCDs  TOTAL TIME TAKING CARE OF THIS PATIENT: 35 minutes.   Likely discharge home tomorrow  Neita Carp M.D on 08/06/2018 at 1:58 PM  Between 7am to 6pm - Pager - [phone]  After 6pm go to [email or web address] - password [email or web address] [facility]  [facility] [place] [facility]  Office  [phone]  CC: Primary care physician; Leonel Ramsay, MD  Note: This dictation was prepared with Dragon dictation along with smaller phrase technology. Any transcriptional errors that result from this process are unintentional.

## 2018-08-06 NOTE — OR Nursing (Signed)
Pt was consented for EGD, colonoscopy added to consent, pt initialed as well as this Therapist, sports.

## 2018-08-07 ENCOUNTER — Encounter: Payer: Self-pay | Admitting: Gastroenterology

## 2018-08-07 LAB — BASIC METABOLIC PANEL
Anion gap: 9 (ref 5–15)
BUN: 29 mg/dL — AB (ref 8–23)
CALCIUM: 9 mg/dL (ref 8.9–10.3)
CO2: 34 mmol/L — AB (ref 22–32)
Chloride: 95 mmol/L — ABNORMAL LOW (ref 98–111)
Creatinine, Ser: 1.98 mg/dL — ABNORMAL HIGH (ref 0.44–1.00)
GFR calc Af Amer: 27 mL/min — ABNORMAL LOW (ref >60)
GFR calc non Af Amer: 24 mL/min — ABNORMAL LOW (ref >60)
GLUCOSE: 182 mg/dL — AB (ref 70–99)
Potassium: 4.1 mmol/L (ref 3.5–5.1)
Sodium: 138 mmol/L (ref 135–145)

## 2018-08-07 LAB — CBC WITH DIFFERENTIAL/PLATELET
ABS IMMATURE GRANULOCYTES: 0.06 10*3/uL (ref 0.00–0.07)
Basophils Absolute: 0 10*3/uL (ref 0.0–0.1)
Basophils Relative: 1 %
EOS ABS: 0.6 10*3/uL — AB (ref 0.0–0.5)
Eosinophils Relative: 7 %
HEMATOCRIT: 29.8 % — AB (ref 36.0–46.0)
Hemoglobin: 9.3 g/dL — ABNORMAL LOW (ref 12.0–15.0)
IMMATURE GRANULOCYTES: 1 %
LYMPHS ABS: 1.1 10*3/uL (ref 0.7–4.0)
Lymphocytes Relative: 13 %
MCH: 29.1 pg (ref 26.0–34.0)
MCHC: 31.2 g/dL (ref 30.0–36.0)
MCV: 93.1 fL (ref 80.0–100.0)
MONO ABS: 1.1 10*3/uL — AB (ref 0.1–1.0)
Monocytes Relative: 13 %
NEUTROS ABS: 5.8 10*3/uL (ref 1.7–7.7)
NEUTROS PCT: 65 %
Platelets: 232 10*3/uL (ref 150–400)
RBC: 3.2 MIL/uL — ABNORMAL LOW (ref 3.87–5.11)
RDW: 15.9 % — ABNORMAL HIGH (ref 11.5–15.5)
WBC: 8.7 10*3/uL (ref 4.0–10.5)
nRBC: 0 % (ref 0.0–0.2)

## 2018-08-07 LAB — GLUCOSE, CAPILLARY
Glucose-Capillary: 165 mg/dL — ABNORMAL HIGH (ref 70–99)
Glucose-Capillary: 205 mg/dL — ABNORMAL HIGH (ref 70–99)
Glucose-Capillary: 172 mg/dL — ABNORMAL HIGH (ref 70–99)

## 2018-08-07 MED ORDER — METFORMIN HCL 500 MG PO TABS: 500.0000 mg | ORAL_TABLET | Freq: Two times a day (BID) | ORAL | 0 refills | Status: DC

## 2018-08-07 MED ORDER — PHENOL 1.4 % MT LIQD
1.0000 | OROMUCOSAL | Status: DC | PRN
  Administered 2018-08-07: 1 via OROMUCOSAL
  Filled 2018-08-07: qty 177

## 2018-08-07 NOTE — Progress Notes (Signed)
Patient being discharged,. Discussed, no further rectal bleeding she states. Refused prep for colonoscopy. Informed if she changes her mind can come back to our office. In the interim to follow up with PCP  Dr Jonathon Bellows MD,MRCP [facility]) Gastroenterology/Hepatology Pager: [phone]

## 2018-08-07 NOTE — Care Management Note (Deleted)
Case Management Note  Patient Details  Name: Carolyn Valdez MRN: [id number] Date of Birth: 1941-06-25  Subjective/Objective:     Spoke with Josh this morning with palliative.  He asked RNCM to facilitate home with hospice care.  Provided patient and her daughter a list of hospice agencies and they chose [facility].  Notified Santiago Glad, RN with hospice.                  Action/Plan:   Expected Discharge Date:  08/07/18               Expected Discharge Plan:  Home w Hospice Care  In-House Referral:  Hospice / Palliative Care  Discharge planning Services  CM Consult  Post Acute Care Choice:    Choice offered to:     DME Arranged:    DME Agency:     HH Arranged:  (hospice at home) [place]:  [facility]  Status of Service:  In process, will continue to follow  If discussed at Long Length of Stay Meetings, dates discussed:    Additional Comments:  Elza Rafter, RN 08/07/2018, 12:32 PM

## 2018-08-07 NOTE — Progress Notes (Signed)
Inpatient Diabetes Program Recommendations  AACE/ADA: New Consensus Statement on Inpatient Glycemic Control (2019)  Target Ranges:  Prepandial:   less than 140 mg/dL      Peak postprandial:   less than 180 mg/dL (1-2 hours)      Critically ill patients:  140 - 180 mg/dL   Results for REEDA, SOOHOO (MRN [id number]) as of 08/07/2018 09:09  Ref. Range 08/06/2018 01:59 08/06/2018 10:22 08/06/2018 12:01 08/06/2018 17:17 08/06/2018 19:57 08/07/2018 02:00 08/07/2018 07:39  Glucose-Capillary Latest Ref Range: 70 - 99 mg/dL 206 (H) 155 (H) 164 (H) 182 (H) 236 (H) 205 (H) 172 (H)   Review of Glycemic Control  Diabetes history: DM2 Outpatient Diabetes medications: Lantus 10 units QHS, Metformin 1000 mg BID Current orders for Inpatient glycemic control: Lantus 5 units QHS, Novolog 0-9 units Q6H  Inpatient Diabetes Program Recommendations:  Insulin - Basal: Please consider increasing Lantus to 8 units QHS. Correction (SSI): Please consider changing frequency of CBGs and Novolog to ACHS.  Thanks, Barnie Alderman, RN, MSN, CDE Diabetes Coordinator Inpatient Diabetes Program [phone] (Team Pager from 8am to 5pm)

## 2018-08-07 NOTE — Progress Notes (Signed)
SATURATION QUALIFICATIONS: (This note is used to comply with regulatory documentation for home oxygen)  Patient Saturations on Room Air at Rest = 96%  Patient Saturations on Room Air while Ambulating = 86%  Patient Saturations on 3 Liters of oxygen while Ambulating = 94%  Please briefly explain why patient needs home oxygen: Patient becomes very short of breath with short distances ambulating. Patient ambulates with walker.

## 2018-08-07 NOTE — Care Management (Signed)
Discharging today.  MD ordered home health PT, RN and aide.  She has 24 hour private aide in place.  Patient would like home health RN, PT.  Offered choice and she would like [facility].  Referral accepted by Helene Kelp.  Patient would prefer Kristen with PT if possible.

## 2018-08-07 NOTE — Evaluation (Signed)
Physical Therapy Evaluation Patient Details Name: Carolyn Valdez MRN: [id number] DOB: 19-Dec-1940 Today's Date: 08/07/2018   History of Present Illness  Pt is a 77 y/o F who presented with SOB, fatigue, LE swelling, and chronic intermittent nausea with vomiting.  Pt uses 2L O2 at baseline. Pt with anemia due to GI bleed with melena and EGD on 10/20 showed AVM.   Pt's PMH includes breast cancer, CHF, R TKA, obesity.     Clinical Impression  Pt admitted with above diagnosis. Pt currently with functional limitations due to the deficits listed below (see PT Problem List). Carolyn Valdez appears to be at her baseline level of mobility.  She receives 24/7 assist/supervision at home.  She currently requires min assist for bed mobility and to stand from bed or BSC.  Her ambulatory endurance is limited by fatigue and SpO2 levels.  Pt requires constant cues for pursed lip breathing technique.  Pt's SpO2 down as low as 85% on 2L O2 with activity which improves within ~15 seconds to 91-93% with pursed lip breathing, RN made aware.  Pt will benefit from skilled PT to increase their independence and safety with mobility to allow discharge to the venue listed below.      Follow Up Recommendations Home health PT;Supervision/Assistance - 24 hour    Equipment Recommendations  None recommended by PT    Recommendations for Other Services       Precautions / Restrictions Precautions Precautions: Fall;Other (comment) Precaution Comments: monitor O2 Restrictions Weight Bearing Restrictions: No      Mobility  Bed Mobility Overal bed mobility: Needs Assistance Bed Mobility: Supine to Sit     Supine to sit: Min assist;HOB elevated     General bed mobility comments: Pt requires assist to elevate trunk with heavy use of bed rail to assist with pulling to sit.   Transfers Overall transfer level: Needs assistance Equipment used: Judie Petit RW) Transfers: Sit to/from Stand Sit to Stand: Therapist, art  transfer comment: Cues for proper hand placement and safe technique.  Assist to boost to standing and for controlled descent to sit.   Ambulation/Gait Ambulation/Gait assistance: Min guard Gait Distance (Feet): 40 Feet Assistive device: (Bari RW) Gait Pattern/deviations: Step-through pattern;Decreased step length - left;Decreased step length - right Gait velocity: decreased   General Gait Details: Cues for pursed lip breathing throughout session.  SpO2 as low as 85% on 2L O2 when ambulating.   Stairs            Wheelchair Mobility    Modified Rankin (Stroke Patients Only)       Balance Overall balance assessment: Needs assistance Sitting-balance support: No upper extremity supported;Feet supported Sitting balance-Leahy Scale: Fair Sitting balance - Comments: Pt able to sit EOB without UE support but would likely lose her balance with perturbation   Standing balance support: Single extremity supported;During functional activity Standing balance-Leahy Scale: Poor Standing balance comment: Pt relies on at least 1UE support with static and dynamic activities                             Pertinent Vitals/Pain Pain Assessment: No/denies pain    Home Living Family/patient expects to be discharged to:: Private residence Living Arrangements: Alone Available Help at Discharge: Personal care attendant;Available 24 hours/day Type of Home: House Home Access: Ramped entrance     Home Layout: One level Home Equipment: Walker - 2 wheels;Wheelchair -  manual;Shower seat - built in      Prior Function Level of Independence: Needs assistance   Gait / Transfers Assistance Needed: Pt ambulating short distances with RW.  No falls in the past 6 months.   ADL's / Homemaking Assistance Needed: Pt requires assist from caregivers for sponge bathing, dressing.  Care attendant does the cooking, cleaning, driving.         Hand Dominance   Dominant Hand: Right     Extremity/Trunk Assessment   Upper Extremity Assessment Upper Extremity Assessment: Generalized weakness    Lower Extremity Assessment Lower Extremity Assessment: Generalized weakness       Communication   Communication: No difficulties  Cognition Arousal/Alertness: Awake/alert Behavior During Therapy: WFL for tasks assessed/performed Overall Cognitive Status: Within Functional Limits for tasks assessed                                        General Comments General comments (skin integrity, edema, etc.): Pt's SpO2 down as low as 85% on 2L O2 with activity which improves within ~15 seconds to 91-93% with pursed lip breathing, RN made aware.     Exercises Other Exercises Other Exercises: Pt requires constant cues and demonstration throughout session to continue performing pursed lip breathing and for proper technique.     Assessment/Plan    PT Assessment Patient needs continued PT services  PT Problem List Decreased strength;Decreased balance;Decreased activity tolerance;Decreased knowledge of use of DME;Cardiopulmonary status limiting activity;Obesity       PT Treatment Interventions DME instruction;Gait training;Functional mobility training;Therapeutic activities;Therapeutic exercise;Balance training;Stair training;Neuromuscular re-education;Patient/family education    PT Goals (Current goals can be found in the Care Plan section)  Acute Rehab PT Goals Patient Stated Goal: to go home and continue her 24/7 care PT Goal Formulation: With patient Time For Goal Achievement: 08/21/18 Potential to Achieve Goals: Fair    Frequency Min 2X/week   Barriers to discharge        Co-evaluation               AM-PAC PT "6 Clicks" Daily Activity  Outcome Measure Difficulty turning over in bed (including adjusting bedclothes, sheets and blankets)?: A Lot Difficulty moving from lying on back to sitting on the side of the bed? : Unable Difficulty sitting down  on and standing up from a chair with arms (e.g., wheelchair, bedside commode, etc,.)?: Unable Help needed moving to and from a bed to chair (including a wheelchair)?: A Little Help needed walking in hospital room?: A Little Help needed climbing 3-5 steps with a railing? : A Lot 6 Click Score: 12    End of Session Equipment Utilized During Treatment: Gait belt;Oxygen Activity Tolerance: Patient limited by fatigue;Patient tolerated treatment well Patient left: in chair;with call bell/phone within reach;with chair alarm set;with nursing/sitter in room Nurse Communication: Mobility status;Other (comment)(SpO2) PT Visit Diagnosis: Unsteadiness on feet (R26.81);Muscle weakness (generalized) (M62.81);Other abnormalities of gait and mobility (R26.89)    Time: 1026-1110 PT Time Calculation (min) (ACUTE ONLY): 44 min   Charges:   PT Evaluation $PT Eval Moderate Complexity: 1 Mod PT Treatments $Gait Training: 8-22 mins $Therapeutic Activity: 8-22 mins        Collie Siad PT, DPT 08/07/2018, 1:47 PM

## 2018-08-07 NOTE — Discharge Summary (Signed)
[facility] [place] NAME: Carolyn Valdez    MR#:  [id number]  DATE OF BIRTH:  04-01-1947  DATE OF ADMISSION:  08/03/2018 ADMITTING PHYSICIAN: Harrie Foreman, MD  DATE OF DISCHARGE: 08/07/2018  PRIMARY CARE PHYSICIAN: Leonel Ramsay, MD   ADMISSION DIAGNOSIS:  Hyperkalemia [E87.5] Acute kidney injury ([place]) [N17.9] Acute on chronic respiratory failure with hypoxia (HCC) [J96.21] Symptomatic anemia [D64.9] Gastrointestinal hemorrhage, unspecified gastrointestinal hemorrhage type [K92.2]  DISCHARGE DIAGNOSIS:  Principal Problem:   Symptomatic anemia Active Problems:   Iron deficiency anemia   Diabetes mellitus, type 2 (HCC)   Hypertension   Congestive heart failure (HCC)   Hyperkalemia   CKD (chronic kidney disease), stage III (HCC)   Acute hemorrhagic gastritis   Stomach irritation   Angiodysplasia of stomach and duodenum   Anemia Acute on chronic diastolic heart failure Acute on chronic hypoxic respiratory failure  SECONDARY DIAGNOSIS:   Past Medical History:  Diagnosis Date  . Anxiety   . Breast cancer ([place]) 1997   right breast mastectomy  . CHF (congestive heart failure) ([place])   . Diabetes mellitus without complication ([place])   . DJD (degenerative joint disease)   . Dysuria   . H/O total knee replacement    right  . HTN (hypertension)   . Hypercholesteremia   . Kidney stone   . Obesity   . Recurrent urinary tract infection   . SVT (supraventricular tachycardia) ([place])   . Urinary incontinence   . Vaginal atrophy   . Yeast vaginitis      ADMITTING HISTORY  Patient described becoming weak when trying to transfer out of bed, leading to shortness of breath.  EMS responded noting pt.'s SpO2 at 80% on room air.  Patient reports fatigue and shortness of breath with activity needing to take more rest breaks when using her walker over the past week.  Patient also reports noticing increased bilateral lower extremity  swelling, and reports chronic intermittent nausea with vomiting episodes Wednesday and today.  Patient reports using 2L-3L [place], and that hospice is not currently assisting her at home due to an eligibility issue.  ED physician performed digital rectal exam noting melena stool.  Hospitalist consulted for admission.  HOSPITAL COURSE:  Patient was admitted to telemetry.  2 units PRBC IV transfusion was done for symptomatic anemia.  Gastroenterology consultation was done.  She was diuresed with torsemide for congestive heart failure.  Kidney functions were closely monitored and nephrology consultation was done.  Hyperkalemia was initially managed with IV insulin, 1 amp of D50.  Patient tolerated PRBC transfusions well.  She had upper endoscopy done and AVM malformations were cauterized.  Colonoscopy could not be done secondary to large amount of stool burden in the colon.  Status post PRBC transfusion and endoscopy patient's hemoglobin remained stable.  She was put on proton pump inhibitor twice daily.  She diuresed well and heart failure was stable at the time of discharge.  Patient stable on oxygen via nasal cannula 2 L.  Patient will be discharged home with home health services.  Patient needs home oxygen.  Outpatient palliative care services to follow the patient. Patient needs oxygen at home.  CONSULTS OBTAINED:  Treatment Team:  Lucilla Lame, MD Murlean Iba, MD Jonathon Bellows, MD  DRUG ALLERGIES:   Allergies  Allergen Reactions  . Sulfa Antibiotics Hives  . Biaxin [Clarithromycin] Hives  . Influenza A (H1n1) Monoval Vac Other (See Comments)    Pt states that  she was told by her MD not to get the influenza vaccine.    . Morphine Other (See Comments)    Reaction:  Dizziness and confusion   . Pyridium [Phenazopyridine Hcl] Other (See Comments)    Reaction:  Unknown   . Ceftriaxone Anxiety  . Latex Rash  . Prednisone Rash  . Tape Rash    DISCHARGE MEDICATIONS:   Allergies as of  08/07/2018      Reactions   Sulfa Antibiotics Hives   Biaxin [clarithromycin] Hives   Influenza A (h1n1) Monoval Vac Other (See Comments)   Pt states that she was told by her MD not to get the influenza vaccine.     Morphine Other (See Comments)   Reaction:  Dizziness and confusion    Pyridium [phenazopyridine Hcl] Other (See Comments)   Reaction:  Unknown    Ceftriaxone Anxiety   Latex Rash   Prednisone Rash   Tape Rash      Medication List    STOP taking these medications   aspirin 81 MG EC tablet   nitrofurantoin 100 MG capsule Commonly known as:  MACRODANTIN   tolterodine 4 MG 24 hr capsule Commonly known as:  DETROL LA     TAKE these medications   acetaminophen 325 MG tablet Commonly known as:  TYLENOL Take 2 tablets (650 mg total) by mouth every 6 (six) hours as needed for mild pain (or Fever >/= 101).   acidophilus Caps capsule Take 1 capsule by mouth at bedtime.   albuterol 108 (90 Base) MCG/ACT inhaler Commonly known as:  PROVENTIL HFA;VENTOLIN HFA Inhale 2 puffs into the lungs every 6 (six) hours as needed for wheezing or shortness of breath.   citalopram 10 MG tablet Commonly known as:  CELEXA Take 1 tablet (10 mg total) by mouth daily.   clonazePAM 0.25 MG disintegrating tablet Commonly known as:  KLONOPIN Take 1 tablet (0.25 mg total) by mouth 2 (two) times daily. What changed:  Another medication with the same name was removed. Continue taking this medication, and follow the directions you see here.   docusate sodium 100 MG capsule Commonly known as:  COLACE Take 100 mg by mouth 2 (two) times daily.   feeding supplement (ENSURE ENLIVE) Liqd Take 237 mLs by mouth 3 (three) times daily between meals.   fluticasone 50 MCG/ACT nasal spray Commonly known as:  FLONASE Place 2 sprays into both nostrils daily as needed for allergies.   hydrALAZINE 50 MG tablet Commonly known as:  APRESOLINE Take 1 tablet (50 mg total) by mouth every 8 (eight)  hours.   insulin glargine 100 UNIT/ML injection Commonly known as:  LANTUS Inject 0.1 mLs (10 Units total) into the skin daily. What changed:    when to take this  Another medication with the same name was removed. Continue taking this medication, and follow the directions you see here.   lamoTRIgine 25 MG tablet Commonly known as:  LAMICTAL Take 1 tablet (25 mg total) by mouth daily.   levETIRAcetam 250 MG tablet Commonly known as:  KEPPRA Take 1 tablet (250 mg total) by mouth 2 (two) times daily.   loratadine 10 MG tablet Commonly known as:  CLARITIN Take 10 mg by mouth daily.   Melatonin 3 MG Tabs Take 6 mg by mouth at bedtime.   metFORMIN 500 MG tablet Commonly known as:  GLUCOPHAGE Take 1 tablet (500 mg total) by mouth 2 (two) times daily with a meal. What changed:    how much  to take  when to take this   metoprolol tartrate 25 MG tablet Commonly known as:  LOPRESSOR Take 1 tablet (25 mg total) by mouth 2 (two) times daily.   nystatin powder Generic drug:  nystatin Apply topically 2 (two) times daily.   OLANZapine 10 MG tablet Commonly known as:  ZYPREXA Take 10 mg by mouth at bedtime.   ondansetron 8 MG disintegrating tablet Commonly known as:  ZOFRAN-ODT Take 8 mg by mouth every 8 (eight) hours as needed for nausea/vomiting.   polyethylene glycol packet Commonly known as:  MIRALAX / GLYCOLAX Take 17 g by mouth daily.   Potassium Chloride ER 20 MEQ Tbcr Take 20 mEq by mouth daily.   senna-docusate 8.6-50 MG tablet Commonly known as:  Senokot-S Take 1 tablet by mouth at bedtime as needed for mild constipation.   torsemide 20 MG tablet Commonly known as:  DEMADEX Take 2 tablets (40 mg total) by mouth daily.            Durable Medical Equipment  (From admission, onward)         Start     Ordered   08/07/18 1421  For home use only DME oxygen  Once    Question Answer Comment  Mode or (Route) Nasal cannula   Liters per Minute 2    Frequency Continuous (stationary and portable oxygen unit needed)   Oxygen delivery system Gas      08/07/18 1420          Today  Patient seen and evaluated today Comfortable on oxygen via nasal cannula at 2 L No chest pain No shortness of breath No fever VITAL SIGNS:  Blood pressure 128/78, pulse 88, temperature 98.6 F (37 C), temperature source Oral, resp. rate 18, height 5\' 7"  (1.702 m), weight 114.8 kg, SpO2 94 %.  I/O:    Intake/Output Summary (Last 24 hours) at 08/07/2018 1443 Last data filed at 08/07/2018 1354 Gross per 24 hour  Intake 0 ml  Output 2350 ml  Net -2350 ml    PHYSICAL EXAMINATION:  Physical Exam  GENERAL:  77 y.o.-year-old patient lying in the bed with no acute distress.  LUNGS: Normal breath sounds bilaterally, no wheezing, rales,rhonchi or crepitation. No use of accessory muscles of respiration.  CARDIOVASCULAR: S1, S2 normal. No murmurs, rubs, or gallops.  ABDOMEN: Soft, non-tender, non-distended. Bowel sounds present. No organomegaly or mass.  NEUROLOGIC: Moves all 4 extremities. PSYCHIATRIC: The patient is alert and oriented x 3.  SKIN: No obvious rash, lesion, or ulcer.   DATA REVIEW:   CBC Recent Labs  Lab 08/07/18 0627  WBC 8.7  HGB 9.3*  HCT 29.8*  PLT 232    Chemistries  Recent Labs  Lab 08/05/18 0809  08/07/18 0627  NA 138   < > 138  K 5.4*   < > 4.1  CL 99   < > 95*  CO2 28   < > 34*  GLUCOSE 220*   < > 182*  BUN 39*   < > 29*  CREATININE 2.33*   < > 1.98*  CALCIUM 9.7   < > 9.0  AST 21  --   --   ALT 17  --   --   ALKPHOS 76  --   --   BILITOT 0.9  --   --    < > = values in this interval not displayed.    Cardiac Enzymes Recent Labs  Lab 08/03/18 2340  TROPONINI <0.03  Microbiology Results  Results for orders placed or performed during the hospital encounter of 07/04/18  MRSA PCR Screening     Status: None   Collection Time: 07/04/18 10:10 PM  Result Value Ref Range Status   MRSA by PCR  NEGATIVE NEGATIVE Final    Comment:        The GeneXpert MRSA Assay (FDA approved for NASAL specimens only), is one component of a comprehensive MRSA colonization surveillance program. It is not intended to diagnose MRSA infection nor to guide or monitor treatment for MRSA infections. Performed at [facility], [address] [place], [place] [address]   Culture, Urine     Status: Abnormal   Collection Time: 07/05/18  5:13 AM  Result Value Ref Range Status   Specimen Description   Final    URINE, RANDOM Performed at [facility], [address] [place], [place] [address]    Special Requests   Final    NONE Performed at [facility], [place], [place] [address]    Culture 30,000 COLONIES/mL ENTEROCOCCUS FAECALIS (A)  Final   Report Status 07/07/2018 FINAL  Final   Organism ID, Bacteria ENTEROCOCCUS FAECALIS (A)  Final      Susceptibility   Enterococcus faecalis - MIC*    AMPICILLIN <=2 SENSITIVE Sensitive     LEVOFLOXACIN >=8 RESISTANT Resistant     NITROFURANTOIN 64 INTERMEDIATE Intermediate     VANCOMYCIN 1 SENSITIVE Sensitive     * 30,000 COLONIES/mL ENTEROCOCCUS FAECALIS    RADIOLOGY:  No results found.  Follow up with PCP in 1 week.  Management plans discussed with the patient, family and they are in agreement.  CODE STATUS: DNR    Code Status Orders  (From admission, onward)         Start     Ordered   08/04/18 0556  Do not attempt resuscitation (DNR)  Continuous    Question Answer Comment  In the event of cardiac or respiratory ARREST Do not call a "code blue"   In the event of cardiac or respiratory ARREST Do not perform Intubation, CPR, defibrillation or ACLS   In the event of cardiac or respiratory ARREST Use medication by any route, position, wound care, and other measures to relive pain and suffering. May use oxygen, suction and manual treatment of airway obstruction as needed for comfort.   Comments  discussed with patient      08/04/18 0556        Code Status History    Date Active Date Inactive Code Status Order ID Comments User Context   07/06/2018 1407 07/14/2018 1603 DNR [id number]  Vaughan Basta, MD Inpatient   07/04/2018 2200 07/06/2018 1407 Full Code [id number]  Demetrios Loll, MD Inpatient   04/22/2018 1702 04/26/2018 1851 DNR [id number]  Max Sane, MD Inpatient   02/21/2017 1629 02/24/2017 1747 DNR [id number]  Fritzi Mandes, MD Inpatient   01/31/2017 1617 02/04/2017 1350 DNR [id number]  Max Sane, MD Inpatient   01/23/2017 1320 01/31/2017 1617 Full Code [id number]  Henreitta Leber, MD Inpatient   10/05/2016 1216 10/26/2016 1740 DNR [id number]  Knox Royalty, NP Inpatient   09/24/2016 1731 10/05/2016 1216 Full Code [id number]  Gladstone Lighter, MD Inpatient   09/01/2016 0217 09/05/2016 1628 Full Code [id number]  Hugelmeyer, [place], DO Inpatient   08/09/2016 2027 08/12/2016 2015 Full Code [id number]  Henreitta Leber, MD Inpatient   09/30/2015 1423 10/07/2015 1737 Full Code [id number]  Loletha Grayer, MD ED  09/05/2015 1756 09/09/2015 1738 DNR [id number]  Loletha Grayer, MD ED      TOTAL TIME TAKING CARE OF THIS PATIENT ON DAY OF DISCHARGE: more than 33 minutes.   Saundra Shelling M.D on 08/07/2018 at 2:43 PM  Between 7am to 6pm - Pager - [phone]  After 6pm go to [email or web address] - password [email or web address] [facility]  [facility] [place]  Office  [phone]  CC: Primary care physician; Leonel Ramsay, MD  Note: This dictation was prepared with Dragon dictation along with smaller phrase technology. Any transcriptional errors that result from this process are unintentional.

## 2018-08-07 NOTE — Care Management (Deleted)
Patient and daughter are comfortable discharging via car.

## 2018-08-07 NOTE — Care Management (Addendum)
Patient does not have portable O2.  RNCM called [facility] who is O2 provider.  Faxing Order, discharge summary and qualifying note to [facility] fax number [phone].  Once received they will send out portable O2.

## 2018-08-23 ENCOUNTER — Other Ambulatory Visit: Payer: Self-pay

## 2018-08-23 DIAGNOSIS — Z853 Personal history of malignant neoplasm of breast: Secondary | ICD-10-CM

## 2018-08-24 ENCOUNTER — Encounter: Payer: Self-pay | Admitting: Gastroenterology

## 2018-08-24 ENCOUNTER — Ambulatory Visit: Payer: Medicare Other | Admitting: Gastroenterology

## 2018-08-24 DIAGNOSIS — D500 Iron deficiency anemia secondary to blood loss (chronic): Secondary | ICD-10-CM

## 2018-08-31 ENCOUNTER — Ambulatory Visit
Admission: RE | Admit: 2018-08-31 | Discharge: 2018-08-31 | Disposition: A | Discharge status: Home / Self Care | Payer: Medicare Other | Source: Ambulatory Visit | Attending: General Surgery | Admitting: General Surgery

## 2018-08-31 DIAGNOSIS — Z1231 Encounter for screening mammogram for malignant neoplasm of breast: Secondary | ICD-10-CM | POA: Insufficient documentation

## 2018-08-31 DIAGNOSIS — Z853 Personal history of malignant neoplasm of breast: Secondary | ICD-10-CM | POA: Insufficient documentation

## 2018-09-04 ENCOUNTER — Ambulatory Visit: Payer: Medicare Other | Admitting: General Surgery

## 2018-09-04 ENCOUNTER — Telehealth: Payer: Self-pay

## 2018-09-04 NOTE — Telephone Encounter (Signed)
-----   Message from Fredirick Maudlin, MD sent at 09/04/2018  2:47 PM EST ----- Regarding: mammogram results Can someone please notify her that her mammogram did not show anything concerning and that she should continue to have annual screening per her PCP.  No need for additional surgery clinic follow up. Thanks! --Lavella Hammock

## 2018-09-28 ENCOUNTER — Ambulatory Visit (INDEPENDENT_AMBULATORY_CARE_PROVIDER_SITE_OTHER): Payer: Medicare Other | Admitting: Gastroenterology

## 2018-09-28 ENCOUNTER — Encounter: Payer: Self-pay | Admitting: Gastroenterology

## 2018-09-28 VITALS — BP 155/66 | HR 81 | Ht 68.0 in | Wt 249.6 lb

## 2018-09-28 DIAGNOSIS — D649 Anemia, unspecified: Secondary | ICD-10-CM | POA: Diagnosis not present

## 2018-09-28 NOTE — Progress Notes (Signed)
Carolyn Antigua, MD [address]  [place], [place] [address]  Main: [phone]  Fax: [phone]   Primary Care Physician: [facility]  Primary Gastroenterologist:  Dr. Vonda Valdez  Chief Complaint  Patient presents with  . New Patient (Initial Visit)    ED F/U GI Bleed, Anemia; still has nausea, spitting up clear     HPI: Carolyn Valdez is a 77 y.o. female with multiple comorbidities, CHF, recently admitted in October 2019 for hypoxia and anemia with hemoglobin around 7.  Patient continues on iron replacement at home.  Reports chronic black stool due to iron replacement.  Is on oxygen via nasal cannula at home and this has not changed.  Denies any abdominal pain.  No nausea or vomiting.  No bright red blood per rectum.  Underwent EGD in October 2019 during her admission Patchy mild inflammation with hemorrhage characterized by erythema and friability noted in the gastric fundus.  Eating cleaning with water jet caused oozing of blood in certain areas which were treated with APC.  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  A mild sharp angulation-like deformity found in the second portion of the duodenum upward from the duodenal bulb.  This area could not be examined during intubation or withdrawal due to this angulation and upper GI study was recommended but was not done.  Colonoscopy on the same day showed large amount of solid stool in the rectum and sigmoid colon interfering with his life cessation.  Extent of exam sigmoid colon.  Stool was solid but black in color.  Colonoscopy with full prep was recommended but patient was discharged and colonoscopy was not done.  Patient had a colonoscopy in September 2015 due to heme positive stool and iron deficiency anemia by Dr. Gustavo Lah.  This reported diverticulosis.  And also inflammation in the descending colon attribute it to  ischemic colitis at the time.  CTA was recommended.  EGD at the same time showed bile gastritis, and duodenal diverticulum.  CTA report as follows: IMPRESSION:  There is no evidence of significant mesenteric artery stenosis or  thrombosis. No evidence of abdominal aortic aneurysm or dissection  is noted. No significant renal artery stenosis is noted. There is no  definite evidence of ischemic colitis seen on this exam.   Pathology report as follows DIAGNOSIS:  A. COLON POLYP, TRANSVERSE; COLD BIOPSY:  - HYPERPLASTIC POLYP.  - NEGATIVE FOR DYSPLASIA AND MALIGNANCY.   B. COLON, TRANSVERSE, DISTAL; COLD BIOPSY:  - COLONIC MUCOSA WITH FOCAL ISCHEMIC CHANGE.  - NEGATIVE FOR DYSPLASIA AND MALIGNANCY.   C. COLON, DESCENDING, DISTAL; BIOPSY:  - COLONIC MUCOSA WITH ISCHEMIC COLITIS.  - NEGATIVE FOR VIRUS CYTOPATHIC EFFECT, DYSPLASIA, AND MALIGNANCY.   D. COLON, SIGMOID, PROXIMAL; COLD BIOPSY:  - UNREMARKABLE COLONIC MUCOSA.   E. ATYPICAL MUCOSA AT 43 CM FOLD; BIOPSY:  - COLONIC MUCOSA WITH FOCAL HYPERPLASTIC EPITHELIAL CHANGE.  - NEGATIVE FOR DYSPLASIA AND MALIGNANCY.   A. STOMACH, ANTRUM; BIOPSY:  -ANTRAL MUCOSA WITH MILD CHRONIC GASTRITIS.  -NEGATIVE FOR H PYLORI, DYSPLASIA AND MALIGNANCY.   B. STOMACH, BODY; BIOPSY:  - OXYNTIC MUCOSA WITH MILD CHRONIC ACTIVE GASTRITIS.  - NEGATIVE FOR DYSPLASIA AND MALIGNANCY.   Colonoscopy in 2008 for rectal bleeding showed internal hemorrhoids EGD 2008 done for heartburn showed gastric erythema  Colonoscopy 2005 for rectal bleeding showed diverticulosis, and two 6 mm sigmoid polyp  Current Outpatient Medications  Medication  Sig Dispense Refill  . acetaminophen (TYLENOL) 325 MG tablet Take 2 tablets (650 mg total) by mouth every 6 (six) hours as needed for mild pain (or Fever >/= 101).    Marland Kitchen acidophilus (RISAQUAD) CAPS capsule Take 1 capsule by mouth at bedtime.    Marland Kitchen albuterol (PROVENTIL HFA;VENTOLIN HFA) 108 (90 Base) MCG/ACT inhaler  Inhale 2 puffs into the lungs every 6 (six) hours as needed for wheezing or shortness of breath. 1 Inhaler 1  . clonazePAM (KLONOPIN) 0.25 MG disintegrating tablet Take 1 tablet (0.25 mg total) by mouth 2 (two) times daily. 60 tablet 0  . docusate sodium (COLACE) 100 MG capsule Take 100 mg by mouth 2 (two) times daily.     . feeding supplement, ENSURE ENLIVE, (ENSURE ENLIVE) LIQD Take 237 mLs by mouth 3 (three) times daily between meals. 237 mL 12  . fluticasone (FLONASE) 50 MCG/ACT nasal spray Place 2 sprays into both nostrils daily as needed for allergies.     . hydrALAZINE (APRESOLINE) 50 MG tablet Take 1 tablet (50 mg total) by mouth every 8 (eight) hours. 90 tablet 0  . insulin glargine (LANTUS) 100 UNIT/ML injection Inject 0.1 mLs (10 Units total) into the skin daily. (Patient taking differently: Inject 10 Units into the skin at bedtime. ) 100 mL 0  . lamoTRIgine (LAMICTAL) 25 MG tablet Take 1 tablet (25 mg total) by mouth daily. 30 tablet 0  . levETIRAcetam (KEPPRA) 250 MG tablet Take 1 tablet (250 mg total) by mouth 2 (two) times daily. 30 tablet 0  . loratadine (CLARITIN) 10 MG tablet Take 10 mg by mouth daily.    . Melatonin 3 MG TABS Take 6 mg by mouth at bedtime.    . metoprolol tartrate (LOPRESSOR) 25 MG tablet Take 1 tablet (25 mg total) by mouth 2 (two) times daily. 60 tablet 0  . nystatin (NYSTATIN) powder Apply topically 2 (two) times daily.    Marland Kitchen OLANZapine (ZYPREXA) 10 MG tablet Take 10 mg by mouth at bedtime.    . ondansetron (ZOFRAN-ODT) 8 MG disintegrating tablet Take 8 mg by mouth every 8 (eight) hours as needed for nausea/vomiting.    . polyethylene glycol (MIRALAX / GLYCOLAX) packet Take 17 g by mouth daily. 14 each 0  . senna-docusate (SENOKOT-S) 8.6-50 MG tablet Take 1 tablet by mouth at bedtime as needed for mild constipation.    . torsemide (DEMADEX) 20 MG tablet Take 2 tablets (40 mg total) by mouth daily. 30 tablet 0  . citalopram (CELEXA) 10 MG tablet Take 1 tablet  (10 mg total) by mouth daily. (Patient not taking: Reported on 08/07/2018) 30 tablet 0  . metFORMIN (GLUCOPHAGE) 500 MG tablet Take 1 tablet (500 mg total) by mouth 2 (two) times daily with a meal. 60 tablet 0  . potassium chloride 20 MEQ TBCR Take 20 mEq by mouth daily. (Patient not taking: Reported on 09/28/2018) 30 tablet 0   No current facility-administered medications for this visit.    Facility-Administered Medications Ordered in Other Visits  Medication Dose Route Frequency Provider Last Rate Last Dose  . 0.9 %  sodium chloride infusion    Continuous PRN Dionne Bucy, CRNA        Allergies as of 09/28/2018 - Review Complete 09/28/2018  Allergen Reaction Noted  . Sulfa antibiotics Hives 03/13/2015  . Biaxin [clarithromycin] Hives 04/17/2015  . Influenza a (h1n1) monoval vac Other (See Comments) 05/07/2015  . Morphine Other (See Comments) 03/13/2015  . Pyridium [phenazopyridine hcl] Other (See  Comments) 04/17/2015  . Ceftriaxone Anxiety 03/13/2015  . Latex Rash 03/13/2015  . Prednisone Rash 03/13/2015  . Tape Rash 03/13/2015    ROS:  General: Negative for anorexia, weight loss, fever, chills, fatigue, weakness. ENT: Negative for hoarseness, difficulty swallowing , nasal congestion. CV: Negative for chest pain, angina, palpitations, dyspnea on exertion, peripheral edema.  Respiratory: Negative for dyspnea at rest, dyspnea on exertion, cough, sputum, wheezing.  GI: See history of present illness. GU:  Negative for dysuria, hematuria, urinary incontinence, urinary frequency, nocturnal urination.  Endo: Negative for unusual weight change.    Physical Examination:   BP (!) 155/66   Pulse 81   Ht 5\' 8"  (1.727 m)   Wt 249 lb 9.6 oz (113.2 kg)   BMI 37.95 kg/m   General: Well-nourished, well-developed in no acute distress.  Eyes: No icterus. Conjunctivae pink. Mouth: Oropharyngeal mucosa moist and pink , no lesions erythema or exudate. Neck: Supple, Trachea  midline Abdomen: Bowel sounds are normal, nontender, nondistended, no hepatosplenomegaly or masses, no abdominal bruits or hernia , no rebound or guarding.   Extremities: No lower extremity edema. No clubbing or deformities. Neuro: Alert and oriented x 3.  Grossly intact. Skin: Warm and dry, no jaundice.   Psych: Alert and cooperative, normal mood and affect.   Labs: CMP     Component Value Date/Time   NA 138 08/07/2018 0627   NA 137 09/08/2014 1042   K 4.1 08/07/2018 0627   K 5.2 (H) 09/08/2014 1042   CL 95 (L) 08/07/2018 0627   CL 103 09/08/2014 1042   CO2 34 (H) 08/07/2018 0627   CO2 28 09/08/2014 1042   GLUCOSE 182 (H) 08/07/2018 0627   GLUCOSE 211 (H) 09/08/2014 1042   BUN 29 (H) 08/07/2018 0627   BUN 16 09/08/2014 1042   CREATININE 1.98 (H) 08/07/2018 0627   CREATININE 1.45 (H) 09/08/2014 1042   CALCIUM 9.0 08/07/2018 0627   CALCIUM 8.3 (L) 09/08/2014 1042   PROT 7.3 08/05/2018 0809   PROT 7.1 05/22/2014 1253   ALBUMIN 3.7 08/05/2018 0809   ALBUMIN 3.2 (L) 05/22/2014 1253   AST 21 08/05/2018 0809   AST 26 05/22/2014 1253   ALT 17 08/05/2018 0809   ALT 22 05/22/2014 1253   ALKPHOS 76 08/05/2018 0809   ALKPHOS 62 05/22/2014 1253   BILITOT 0.9 08/05/2018 0809   BILITOT 0.3 05/22/2014 1253   GFRNONAA 24 (L) 08/07/2018 0627   GFRNONAA 38 (L) 09/08/2014 1042   GFRNONAA 45 (L) 07/09/2014 1631   GFRAA 27 (L) 08/07/2018 0627   GFRAA 46 (L) 09/08/2014 1042   GFRAA 52 (L) 07/09/2014 1631   Lab Results  Component Value Date   WBC 8.7 08/07/2018   HGB 9.3 (L) 08/07/2018   HCT 29.8 (L) 08/07/2018   MCV 93.1 08/07/2018   PLT 232 08/07/2018    Imaging Studies: Mm 3d Screen Breast Uni Left  Result Date: 08/31/2018 CLINICAL DATA:  Screening. EXAM: DIGITAL SCREENING UNILATERAL LEFT MAMMOGRAM WITH CAD AND TOMO COMPARISON:  Previous exam(s). ACR Breast Density Category c: The breast tissue is heterogeneously dense, which may obscure small masses. FINDINGS: The patient has  had a right mastectomy. There are no findings suspicious for malignancy. Images were processed with CAD. IMPRESSION: No mammographic evidence of malignancy. A result letter of this screening mammogram will be mailed directly to the patient. RECOMMENDATION: Screening mammogram in one year.  (Code:SM-L-33M) BI-RADS CATEGORY  1: Negative. Electronically Signed   By: Shayne Alken.D.  On: 08/31/2018 17:03    Assessment and Plan:   Carolyn Valdez is a 77 y.o. y/o female here for follow-up of anemia  Patient's anemia during her hospitalization may have been due to the friable gastric mucosa and small bowel AVMs noted The cause of the friable gastric fundus mucosa is unknown Liver ultrasound was recommended to evaluate for cirrhosis but this was not done Will order, patient agreeable  Will repeat labs to evaluate if Hgb has improved and check for Iron deficiency  I did discuss that patient's last colonoscopy had a poor prep and so was not completed and she needs a full colonoscopy Patient does not want to schedule it at this time as she states she may not be able to tolerate the prep We will start with labs and see if she has responded In addition, with a colonoscopy and EGD versus upper GI study will be needed to evaluate her duodenal deformity  Patient would like to evaluate her labs of improved prior to scheduling any procedures.  Follow-up in clinic closely  Dr Carolyn Valdez

## 2018-09-28 NOTE — Patient Instructions (Signed)
Will contact you for liver ultrasound to evaluate for cirrhosis.

## 2018-09-29 LAB — COMPREHENSIVE METABOLIC PANEL
ALBUMIN: 4 g/dL (ref 3.5–4.8)
ALT: 4 IU/L (ref 0–32)
AST: 14 IU/L (ref 0–40)
Albumin/Globulin Ratio: 1.3 (ref 1.2–2.2)
Alkaline Phosphatase: 85 IU/L (ref 39–117)
BILIRUBIN TOTAL: 0.3 mg/dL (ref 0.0–1.2)
BUN / CREAT RATIO: 12 (ref 12–28)
BUN: 33 mg/dL — AB (ref 8–27)
CALCIUM: 9.6 mg/dL (ref 8.7–10.3)
CHLORIDE: 95 mmol/L — AB (ref 96–106)
CO2: 25 mmol/L (ref 20–29)
CREATININE: 2.69 mg/dL — AB (ref 0.57–1.00)
GFR calc Af Amer: 19 mL/min/{1.73_m2} — ABNORMAL LOW (ref >59)
GFR calc non Af Amer: 17 mL/min/{1.73_m2} — ABNORMAL LOW (ref >59)
GLUCOSE: 245 mg/dL — AB (ref 65–99)
Globulin, Total: 3.1 g/dL (ref 1.5–4.5)
Potassium: 4.6 mmol/L (ref 3.5–5.2)
Sodium: 138 mmol/L (ref 134–144)
TOTAL PROTEIN: 7.1 g/dL (ref 6.0–8.5)

## 2018-09-29 LAB — CBC
Hematocrit: 27.8 % — ABNORMAL LOW (ref 34.0–46.6)
Hemoglobin: 8.9 g/dL — ABNORMAL LOW (ref 11.1–15.9)
MCH: 28.8 pg (ref 26.6–33.0)
MCHC: 32 g/dL (ref 31.5–35.7)
MCV: 90 fL (ref 79–97)
PLATELETS: 286 10*3/uL (ref 150–450)
RBC: 3.09 x10E6/uL — AB (ref 3.77–5.28)
RDW: 13.2 % (ref 12.3–15.4)
WBC: 6.6 10*3/uL (ref 3.4–10.8)

## 2018-09-29 LAB — FERRITIN: Ferritin: 266 ng/mL — ABNORMAL HIGH (ref 15–150)

## 2018-09-29 LAB — IRON AND TIBC
IRON SATURATION: 25 % (ref 15–55)
IRON: 74 ug/dL (ref 27–139)
Total Iron Binding Capacity: 301 ug/dL (ref 250–450)
UIBC: 227 ug/dL (ref 118–369)

## 2018-09-29 LAB — PROTIME-INR
INR: 1.1 (ref 0.8–1.2)
PROTHROMBIN TIME: 11.7 s (ref 9.1–12.0)

## 2018-10-03 ENCOUNTER — Telehealth: Payer: Self-pay | Admitting: Gastroenterology

## 2018-10-03 NOTE — Telephone Encounter (Signed)
Pt is calling to  Check on status of prev. message

## 2018-10-03 NOTE — Telephone Encounter (Signed)
Pt is calling to reschedule her u/s for an afternoon apt and she like it after Christmas day please call pt to discuss this first she has a sitter that will be taking her to this apt and there are certain days she will be able to take her

## 2018-10-04 NOTE — Telephone Encounter (Signed)
I had spoke with pt's sitter yesterday am and gave her the information with the pt listening such as lab results and need for hematology referral. Pt calls back for more information and to reschedule US liver doppler. Also pt wants to schedule colonoscopy in a few months because she is feeling week. Will send recall letter out to contact office in 2 months to schedule colonoscopy. Also gave pt the number for central scheduling [phone] so she could contact them for either time change or different day.

## 2018-10-04 NOTE — Telephone Encounter (Signed)
-----   Message from Virgel Manifold, MD sent at 09/29/2018 10:04 AM EST ----- Elner Seifert please let patient know, her hemoglobin has not improved, but her labs do not show iron deficiency. Please refer her to hematology to evaluate the cause of her anemia. Since she did not have a full colonoscopy, we recommend full colonoscopy to rule out colon cancer or any other lesions.

## 2018-10-06 ENCOUNTER — Other Ambulatory Visit: Payer: Self-pay

## 2018-10-06 ENCOUNTER — Ambulatory Visit: Payer: Medicare Other

## 2018-10-06 DIAGNOSIS — D649 Anemia, unspecified: Secondary | ICD-10-CM

## 2018-10-16 ENCOUNTER — Ambulatory Visit
Admission: RE | Admit: 2018-10-16 | Discharge: 2018-10-16 | Disposition: A | Discharge status: Home / Self Care | Payer: Medicare Other | Source: Ambulatory Visit | Attending: Gastroenterology | Admitting: Gastroenterology

## 2018-10-16 DIAGNOSIS — D649 Anemia, unspecified: Secondary | ICD-10-CM | POA: Insufficient documentation

## 2018-10-17 ENCOUNTER — Ambulatory Visit
Admission: RE | Admit: 2018-10-17 | Discharge: 2018-10-17 | Disposition: A | Discharge status: Home / Self Care | Payer: Medicare Other | Source: Ambulatory Visit | Attending: Gastroenterology | Admitting: Gastroenterology

## 2018-10-17 DIAGNOSIS — D649 Anemia, unspecified: Secondary | ICD-10-CM | POA: Insufficient documentation

## 2018-10-20 NOTE — Progress Notes (Signed)
[place]  Telephone:([phone] [phone]  ID: Carolyn Valdez OB: 1940-10-27  MR#: [id number]  QQP#:[id number]  Patient Care Team: [facility] as PCP - General (Geriatric Medicine)  CHIEF COMPLAINT: Anemia, possibly related to chronic renal insufficiency.  INTERVAL HISTORY: Patient was last evaluated in clinic in September 2016.  She is referred back for persistent anemia.  She has chronic weakness and fatigue, but otherwise feels well.  She has no neurologic complaints.  She denies any recent fevers or illnesses.  She has a good appetite and denies weight loss.  She denies any chest pain or shortness of breath.  She has no nausea, vomiting, constipation, or diarrhea.  She denies any melena or hematochezia.  She has no urinary complaints.  Patient offers no further specific complaints today.  REVIEW OF SYSTEMS:   Review of Systems  Constitutional: Positive for malaise/fatigue. Negative for fever and weight loss.  Respiratory: Negative.  Negative for cough, hemoptysis and shortness of breath.   Cardiovascular: Negative.  Negative for chest pain and leg swelling.  Gastrointestinal: Negative.  Negative for abdominal pain, blood in stool and melena.  Genitourinary: Negative.  Negative for hematuria.  Musculoskeletal: Negative.  Negative for joint pain.  Skin: Negative.  Negative for rash.  Neurological: Positive for weakness. Negative for focal weakness and headaches.  Psychiatric/Behavioral: Negative.  The patient is not nervous/anxious.     As per HPI. Otherwise, a complete review of systems is negative.  PAST MEDICAL HISTORY: Past Medical History:  Diagnosis Date  . Anxiety   . Breast cancer ([place]) 1997   right breast mastectomy  . CHF (congestive heart failure) ([place])   . Diabetes mellitus without complication ([place])   . DJD (degenerative joint disease)   . Dysuria   . H/O total knee replacement    right  . HTN (hypertension)   .  Hypercholesteremia   . Kidney stone   . Obesity   . Recurrent urinary tract infection   . SVT (supraventricular tachycardia) ([place])   . Urinary incontinence   . Vaginal atrophy   . Yeast vaginitis     PAST SURGICAL HISTORY: Past Surgical History:  Procedure Laterality Date  . CARDIAC CATHETERIZATION    . CHOLECYSTECTOMY    . COLONOSCOPY N/A 08/06/2018   Procedure: COLONOSCOPY;  Surgeon: Virgel Manifold, MD;  Location: [facility] ENDOSCOPY;  Service: Endoscopy;  Laterality: N/A;  . ESOPHAGOGASTRODUODENOSCOPY N/A 08/06/2018   Procedure: ESOPHAGOGASTRODUODENOSCOPY (EGD);  Surgeon: Virgel Manifold, MD;  Location: [facility] ENDOSCOPY;  Service: Endoscopy;  Laterality: N/A;  . FOOT SURGERY    . JOINT REPLACEMENT    . MASTECTOMY Right 1997  . NASAL SEPTUM SURGERY    . PERIPHERAL VASCULAR CATHETERIZATION N/A 07/08/2015   Procedure: PICC Line Insertion;  Surgeon: Algernon Huxley, MD;  Location: [place] [facility] CV LAB;  Service: Cardiovascular;  Laterality: N/A;  . TONSILLECTOMY    . Vocal cords      FAMILY HISTORY: Family History  Problem Relation Age of Onset  . Lung cancer Father   . Hematuria Mother   . Lung cancer Mother   . Kidney disease Neg Hx   . Bladder Cancer Neg Hx   . Breast cancer Neg Hx     ADVANCED DIRECTIVES (Y/N):  N  HEALTH MAINTENANCE: Social History   Tobacco Use  . Smoking status: Former Research scientist (life sciences)  . Smokeless tobacco: Former Systems developer    Quit date: 10/29/1995  Substance Use Topics  . Alcohol use: No  Alcohol/week: 0.0 standard drinks  . Drug use: No     Colonoscopy:  PAP:  Bone density:  Lipid panel:  Allergies  Allergen Reactions  . Sulfa Antibiotics Hives  . Biaxin [Clarithromycin] Hives  . Influenza A (H1n1) Monoval Vac Other (See Comments)    Pt states that she was told by her MD not to get the influenza vaccine.    . Morphine Other (See Comments)    Reaction:  Dizziness and confusion   . Pyridium [Phenazopyridine Hcl] Other (See Comments)     Reaction:  Unknown   . Ceftriaxone Anxiety  . Latex Rash  . Prednisone Rash  . Tape Rash    Current Outpatient Medications  Medication Sig Dispense Refill  . acetaminophen (TYLENOL) 325 MG tablet Take 2 tablets (650 mg total) by mouth every 6 (six) hours as needed for mild pain (or Fever >/= 101).    Marland Kitchen acidophilus (RISAQUAD) CAPS capsule Take 1 capsule by mouth at bedtime.    Marland Kitchen albuterol (PROVENTIL HFA;VENTOLIN HFA) 108 (90 Base) MCG/ACT inhaler Inhale 2 puffs into the lungs every 6 (six) hours as needed for wheezing or shortness of breath. 1 Inhaler 1  . citalopram (CELEXA) 10 MG tablet Take 1 tablet (10 mg total) by mouth daily. 30 tablet 0  . clonazePAM (KLONOPIN) 0.5 MG tablet Take 1 tablet by mouth at bedtime.    . docusate sodium (COLACE) 100 MG capsule Take 100 mg by mouth 2 (two) times daily.     . feeding supplement, ENSURE ENLIVE, (ENSURE ENLIVE) LIQD Take 237 mLs by mouth 3 (three) times daily between meals. 237 mL 12  . fluticasone (FLONASE) 50 MCG/ACT nasal spray Place 2 sprays into both nostrils daily as needed for allergies.     . hydrALAZINE (APRESOLINE) 50 MG tablet Take 1 tablet (50 mg total) by mouth every 8 (eight) hours. 90 tablet 0  . insulin glargine (LANTUS) 100 UNIT/ML injection Inject 0.1 mLs (10 Units total) into the skin daily. (Patient taking differently: Inject 10 Units into the skin at bedtime. ) 100 mL 0  . lamoTRIgine (LAMICTAL) 25 MG tablet Take 1 tablet (25 mg total) by mouth daily. 30 tablet 0  . levETIRAcetam (KEPPRA) 250 MG tablet Take 1 tablet (250 mg total) by mouth 2 (two) times daily. 30 tablet 0  . loratadine (CLARITIN) 10 MG tablet Take 10 mg by mouth daily.    . Melatonin 3 MG TABS Take 6 mg by mouth at bedtime.    . metoprolol tartrate (LOPRESSOR) 25 MG tablet Take 1 tablet (25 mg total) by mouth 2 (two) times daily. 60 tablet 0  . nitrofurantoin (MACRODANTIN) 100 MG capsule Take 1 capsule by mouth.    . nystatin (NYSTATIN) powder Apply topically  2 (two) times daily.    Marland Kitchen OLANZapine (ZYPREXA) 10 MG tablet Take 10 mg by mouth at bedtime.    . ondansetron (ZOFRAN-ODT) 8 MG disintegrating tablet Take 8 mg by mouth every 8 (eight) hours as needed for nausea/vomiting.    . polyethylene glycol (MIRALAX / GLYCOLAX) packet Take 17 g by mouth daily. 14 each 0  . potassium chloride 20 MEQ TBCR Take 20 mEq by mouth daily. 30 tablet 0  . senna (SENNA-TIME) 8.6 MG tablet Take 1 tablet by mouth.    . torsemide (DEMADEX) 20 MG tablet Take 2 tablets (40 mg total) by mouth daily. 30 tablet 0  . vitamin E 1000 UNIT capsule Take 1 capsule by mouth 1 day or 1  dose.    . metFORMIN (GLUCOPHAGE) 500 MG tablet Take 1 tablet (500 mg total) by mouth 2 (two) times daily with a meal. 60 tablet 0   No current facility-administered medications for this visit.    Facility-Administered Medications Ordered in Other Visits  Medication Dose Route Frequency Provider Last Rate Last Dose  . 0.9 %  sodium chloride infusion    Continuous PRN Dionne Bucy, CRNA        OBJECTIVE: Vitals:   10/23/18 1341  BP: (!) 170/83  Pulse: (!) 50  Temp: (!) 96.3 F (35.7 C)     Body mass index is 37.86 kg/m.    ECOG FS:1 - Symptomatic but completely ambulatory  General: Well-developed, well-nourished, no acute distress. Eyes: Pink conjunctiva, anicteric sclera. HEENT: Normocephalic, moist mucous membranes, clear oropharnyx. Lungs: Clear to auscultation bilaterally. Heart: Regular rate and rhythm. No rubs, murmurs, or gallops. Abdomen: Soft, nontender, nondistended. No organomegaly noted, normoactive bowel sounds. Musculoskeletal: No edema, cyanosis, or clubbing. Neuro: Alert, answering all questions appropriately. Cranial nerves grossly intact. Skin: No rashes or petechiae noted. Psych: Normal affect. Lymphatics: No cervical, calvicular, axillary or inguinal LAD.   LAB RESULTS:  Lab Results  Component Value Date   NA 138 09/28/2018   K 4.6 09/28/2018   CL 95 (L)  09/28/2018   CO2 25 09/28/2018   GLUCOSE 245 (H) 09/28/2018   BUN 33 (H) 09/28/2018   CREATININE 2.69 (H) 09/28/2018   CALCIUM 9.6 09/28/2018   PROT 7.1 09/28/2018   ALBUMIN 4.0 09/28/2018   AST 14 09/28/2018   ALT 4 09/28/2018   ALKPHOS 85 09/28/2018   BILITOT 0.3 09/28/2018   GFRNONAA 17 (L) 09/28/2018   GFRAA 19 (L) 09/28/2018    Lab Results  Component Value Date   WBC 4.9 10/23/2018   NEUTROABS 5.8 08/07/2018   HGB 7.8 (L) 10/23/2018   HCT 25.6 (L) 10/23/2018   MCV 95.5 10/23/2018   PLT 198 10/23/2018   Lab Results  Component Value Date   IRON 46 10/23/2018   TIBC 317 10/23/2018   IRONPCTSAT 15 10/23/2018   Lab Results  Component Value Date   FERRITIN 127 10/23/2018     STUDIES: US Liver Doppler  Result Date: 10/17/2018 CLINICAL DATA:  79 year old female with possible hepatic cirrhosis EXAM: DUPLEX ULTRASOUND OF LIVER TECHNIQUE: Color and duplex Doppler ultrasound was performed to evaluate the hepatic in-flow and out-flow vessels. COMPARISON:  None. FINDINGS: Liver: Normal parenchymal echogenicity. Normal hepatic contour without nodularity. No focal lesion, mass or intrahepatic biliary ductal dilatation. Portal Vein Velocities Main:  29-35 cm/sec with normal hepatopetal directional flow. Right:  26 cm/sec with normal hepatopetal directional flow. Left:  19 cm/sec with normal hepatopetal directional flow. Hepatic Vein Velocities Right:  32 cm/sec Middle:  19 cm/sec Left:  28 cm/sec IVC: Present and patent with normal respiratory phasicity. Hepatic Artery Velocity:  81 cm/sec Splenic Vein Velocity:  29 cm/sec Varices: None Ascites: None Spleen: 10.9 x 8.2 x 13.8 cm for a total volume of 643 cubic cm which is enlarged. IMPRESSION: 1. Widely patent hepatic and portal veins with normal directional flow and no evidence of portal venous hypertension. 2. Normal sonographic appearance of the liver. No overt morphologic changes of cirrhosis. 3. Splenomegaly of indeterminate  etiology. Signed, Criselda Peaches, MD, [facility] Radiology Electronically Signed   By: Jacqulynn Cadet M.D.   On: 10/17/2018 15:05    ASSESSMENT: Anemia, possibly related to chronic renal insufficiency.  PLAN:    1.  Anemia: Patient's hemoglobin is significantly decreased, but her iron stores are within normal limits.  Her erythropoietin level and reticulocyte count are inappropriately normal.  She also has a mildly decreased B12 level.  Given her chronic renal insufficiency, patient will benefit from Procrit.  She does not require IV Feraheme at this time.  Can consider bone marrow biopsy in the future, but this is not necessary at this time.  Return to clinic in 2 to 3 weeks for laboratory work and initiation of Procrit.  I spent a total of 30 minutes face-to-face with the patient of which greater than 50% of the visit was spent in counseling and coordination of care as detailed above.  Patient expressed understanding and was in agreement with this plan. She also understands that She can call clinic at any time with any questions, concerns, or complaints.    Lloyd Huger, MD   10/25/2018 12:11 PM

## 2018-10-23 ENCOUNTER — Inpatient Hospital Stay: Payer: Medicare Other | Attending: Oncology | Admitting: Oncology

## 2018-10-23 ENCOUNTER — Inpatient Hospital Stay: Payer: Medicare Other

## 2018-10-23 ENCOUNTER — Other Ambulatory Visit: Payer: Self-pay

## 2018-10-23 VITALS — BP 170/83 | HR 50 | Temp 96.3°F | Ht 68.0 in | Wt 249.0 lb

## 2018-10-23 DIAGNOSIS — R531 Weakness: Secondary | ICD-10-CM | POA: Diagnosis not present

## 2018-10-23 DIAGNOSIS — E669 Obesity, unspecified: Secondary | ICD-10-CM | POA: Diagnosis not present

## 2018-10-23 DIAGNOSIS — Z801 Family history of malignant neoplasm of trachea, bronchus and lung: Secondary | ICD-10-CM | POA: Insufficient documentation

## 2018-10-23 DIAGNOSIS — E7800 Pure hypercholesterolemia, unspecified: Secondary | ICD-10-CM | POA: Diagnosis not present

## 2018-10-23 DIAGNOSIS — N952 Postmenopausal atrophic vaginitis: Secondary | ICD-10-CM | POA: Insufficient documentation

## 2018-10-23 DIAGNOSIS — Z87442 Personal history of urinary calculi: Secondary | ICD-10-CM | POA: Diagnosis not present

## 2018-10-23 DIAGNOSIS — Z79899 Other long term (current) drug therapy: Secondary | ICD-10-CM | POA: Diagnosis not present

## 2018-10-23 DIAGNOSIS — I471 Supraventricular tachycardia: Secondary | ICD-10-CM | POA: Insufficient documentation

## 2018-10-23 DIAGNOSIS — M1990 Unspecified osteoarthritis, unspecified site: Secondary | ICD-10-CM | POA: Insufficient documentation

## 2018-10-23 DIAGNOSIS — I509 Heart failure, unspecified: Secondary | ICD-10-CM | POA: Insufficient documentation

## 2018-10-23 DIAGNOSIS — F419 Anxiety disorder, unspecified: Secondary | ICD-10-CM | POA: Diagnosis not present

## 2018-10-23 DIAGNOSIS — Z87440 Personal history of urinary (tract) infections: Secondary | ICD-10-CM | POA: Diagnosis not present

## 2018-10-23 DIAGNOSIS — D509 Iron deficiency anemia, unspecified: Secondary | ICD-10-CM

## 2018-10-23 DIAGNOSIS — B373 Candidiasis of vulva and vagina: Secondary | ICD-10-CM | POA: Diagnosis not present

## 2018-10-23 DIAGNOSIS — Z8052 Family history of malignant neoplasm of bladder: Secondary | ICD-10-CM | POA: Insufficient documentation

## 2018-10-23 DIAGNOSIS — Z794 Long term (current) use of insulin: Secondary | ICD-10-CM | POA: Insufficient documentation

## 2018-10-23 DIAGNOSIS — I10 Essential (primary) hypertension: Secondary | ICD-10-CM | POA: Diagnosis not present

## 2018-10-23 DIAGNOSIS — Z803 Family history of malignant neoplasm of breast: Secondary | ICD-10-CM | POA: Insufficient documentation

## 2018-10-23 DIAGNOSIS — E119 Type 2 diabetes mellitus without complications: Secondary | ICD-10-CM | POA: Diagnosis not present

## 2018-10-23 DIAGNOSIS — R5383 Other fatigue: Secondary | ICD-10-CM | POA: Diagnosis not present

## 2018-10-23 DIAGNOSIS — Z87891 Personal history of nicotine dependence: Secondary | ICD-10-CM | POA: Insufficient documentation

## 2018-10-23 DIAGNOSIS — D631 Anemia in chronic kidney disease: Secondary | ICD-10-CM

## 2018-10-23 DIAGNOSIS — N189 Chronic kidney disease, unspecified: Secondary | ICD-10-CM

## 2018-10-23 DIAGNOSIS — R32 Unspecified urinary incontinence: Secondary | ICD-10-CM | POA: Insufficient documentation

## 2018-10-23 LAB — RETICULOCYTES
Immature Retic Fract: 6.6 % (ref 2.3–15.9)
RBC.: 2.68 MIL/uL — AB (ref 3.87–5.11)
RETIC COUNT ABSOLUTE: 65.9 10*3/uL (ref 19.0–186.0)
Retic Ct Pct: 2.5 % (ref 0.4–3.1)

## 2018-10-23 LAB — CBC
HCT: 25.6 % — ABNORMAL LOW (ref 36.0–46.0)
Hemoglobin: 7.8 g/dL — ABNORMAL LOW (ref 12.0–15.0)
MCH: 29.1 pg (ref 26.0–34.0)
MCHC: 30.5 g/dL (ref 30.0–36.0)
MCV: 95.5 fL (ref 80.0–100.0)
NRBC: 0 % (ref 0.0–0.2)
PLATELETS: 198 10*3/uL (ref 150–400)
RBC: 2.68 MIL/uL — ABNORMAL LOW (ref 3.87–5.11)
RDW: 14.2 % (ref 11.5–15.5)
WBC: 4.9 10*3/uL (ref 4.0–10.5)

## 2018-10-23 LAB — IRON AND TIBC
IRON: 46 ug/dL (ref 28–170)
Saturation Ratios: 15 % (ref 10.4–31.8)
TIBC: 317 ug/dL (ref 250–450)
UIBC: 271 ug/dL

## 2018-10-23 LAB — LACTATE DEHYDROGENASE: LDH: 138 U/L (ref 98–192)

## 2018-10-23 LAB — DAT, POLYSPECIFIC AHG (ARMC ONLY): Polyspecific AHG test: NEGATIVE

## 2018-10-23 LAB — VITAMIN B12: VITAMIN B 12: 163 pg/mL — AB (ref 180–914)

## 2018-10-23 LAB — FOLATE: FOLATE: 13 ng/mL (ref >5.9)

## 2018-10-23 LAB — FERRITIN: Ferritin: 127 ng/mL (ref 11–307)

## 2018-10-23 NOTE — Progress Notes (Signed)
Patient is here today to follow up on her Iron deficiency anemia. Patient stated that she stays with no energy. Patient also stated that she stays constipated.

## 2018-10-24 LAB — ERYTHROPOIETIN: ERYTHROPOIETIN: 8.3 m[IU]/mL (ref 2.6–18.5)

## 2018-10-24 LAB — HAPTOGLOBIN: HAPTOGLOBIN: 209 mg/dL (ref 42–346)

## 2018-10-25 ENCOUNTER — Encounter: Payer: Self-pay | Admitting: Oncology

## 2018-10-25 ENCOUNTER — Other Ambulatory Visit: Payer: Self-pay | Admitting: Oncology

## 2018-10-25 DIAGNOSIS — D631 Anemia in chronic kidney disease: Secondary | ICD-10-CM

## 2018-10-25 DIAGNOSIS — N189 Chronic kidney disease, unspecified: Secondary | ICD-10-CM | POA: Insufficient documentation

## 2018-11-05 NOTE — Progress Notes (Deleted)
[place]  [phone] [phone]  ID: Carolyn Valdez OB: December 27, 1940  MR#: [id number]  MGN#:[id number]  Patient Care Team: [facility] as PCP - General (Geriatric Medicine)  CHIEF COMPLAINT: Anemia, possibly related to chronic renal insufficiency.  INTERVAL HISTORY: Patient was last evaluated in clinic in September 2016.  She is referred back for persistent anemia.  She has chronic weakness and fatigue, but otherwise feels well.  She has no neurologic complaints.  She denies any recent fevers or illnesses.  She has a good appetite and denies weight loss.  She denies any chest pain or shortness of breath.  She has no nausea, vomiting, constipation, or diarrhea.  She denies any melena or hematochezia.  She has no urinary complaints.  Patient offers no further specific complaints today.  REVIEW OF SYSTEMS:   Review of Systems  Constitutional: Positive for malaise/fatigue. Negative for fever and weight loss.  Respiratory: Negative.  Negative for cough, hemoptysis and shortness of breath.   Cardiovascular: Negative.  Negative for chest pain and leg swelling.  Gastrointestinal: Negative.  Negative for abdominal pain, blood in stool and melena.  Genitourinary: Negative.  Negative for hematuria.  Musculoskeletal: Negative.  Negative for joint pain.  Skin: Negative.  Negative for rash.  Neurological: Positive for weakness. Negative for focal weakness and headaches.  Psychiatric/Behavioral: Negative.  The patient is not nervous/anxious.     As per HPI. Otherwise, a complete review of systems is negative.  PAST MEDICAL HISTORY: Past Medical History:  Diagnosis Date  . Anxiety   . Breast cancer ([place]) 1997   right breast mastectomy  . CHF (congestive heart failure) ([place])   . Diabetes mellitus without complication ([place])   . DJD (degenerative joint disease)   . Dysuria   . H/O total knee replacement    right  . HTN (hypertension)   .  Hypercholesteremia   . Kidney stone   . Obesity   . Recurrent urinary tract infection   . SVT (supraventricular tachycardia) ([place])   . Urinary incontinence   . Vaginal atrophy   . Yeast vaginitis     PAST SURGICAL HISTORY: Past Surgical History:  Procedure Laterality Date  . CARDIAC CATHETERIZATION    . CHOLECYSTECTOMY    . COLONOSCOPY N/A 08/06/2018   Procedure: COLONOSCOPY;  Surgeon: Virgel Manifold, MD;  Location: [facility] ENDOSCOPY;  Service: Endoscopy;  Laterality: N/A;  . ESOPHAGOGASTRODUODENOSCOPY N/A 08/06/2018   Procedure: ESOPHAGOGASTRODUODENOSCOPY (EGD);  Surgeon: Virgel Manifold, MD;  Location: [facility] ENDOSCOPY;  Service: Endoscopy;  Laterality: N/A;  . FOOT SURGERY    . JOINT REPLACEMENT    . MASTECTOMY Right 1997  . NASAL SEPTUM SURGERY    . PERIPHERAL VASCULAR CATHETERIZATION N/A 07/08/2015   Procedure: PICC Line Insertion;  Surgeon: Algernon Huxley, MD;  Location: [place] CV LAB;  Service: Cardiovascular;  Laterality: N/A;  . TONSILLECTOMY    . Vocal cords      FAMILY HISTORY: Family History  Problem Relation Age of Onset  . Lung cancer Father   . Hematuria Mother   . Lung cancer Mother   . Kidney disease Neg Hx   . Bladder Cancer Neg Hx   . Breast cancer Neg Hx     ADVANCED DIRECTIVES (Y/N):  N  HEALTH MAINTENANCE: Social History   Tobacco Use  . Smoking status: Former Research scientist (life sciences)  . Smokeless tobacco: Former Systems developer    Quit date: 10/29/1995  Substance Use Topics  . Alcohol use: No  Alcohol/week: 0.0 standard drinks  . Drug use: No     Colonoscopy:  PAP:  Bone density:  Lipid panel:  Allergies  Allergen Reactions  . Sulfa Antibiotics Hives  . Biaxin [Clarithromycin] Hives  . Influenza A (H1n1) Monoval Vac Other (See Comments)    Pt states that she was told by her MD not to get the influenza vaccine.    . Morphine Other (See Comments)    Reaction:  Dizziness and confusion   . Pyridium [Phenazopyridine Hcl] Other (See Comments)     Reaction:  Unknown   . Ceftriaxone Anxiety  . Latex Rash  . Prednisone Rash  . Tape Rash    Current Outpatient Medications  Medication Sig Dispense Refill  . acetaminophen (TYLENOL) 325 MG tablet Take 2 tablets (650 mg total) by mouth every 6 (six) hours as needed for mild pain (or Fever >/= 101).    Marland Kitchen acidophilus (RISAQUAD) CAPS capsule Take 1 capsule by mouth at bedtime.    Marland Kitchen albuterol (PROVENTIL HFA;VENTOLIN HFA) 108 (90 Base) MCG/ACT inhaler Inhale 2 puffs into the lungs every 6 (six) hours as needed for wheezing or shortness of breath. 1 Inhaler 1  . citalopram (CELEXA) 10 MG tablet Take 1 tablet (10 mg total) by mouth daily. 30 tablet 0  . clonazePAM (KLONOPIN) 0.5 MG tablet Take 1 tablet by mouth at bedtime.    . docusate sodium (COLACE) 100 MG capsule Take 100 mg by mouth 2 (two) times daily.     . feeding supplement, ENSURE ENLIVE, (ENSURE ENLIVE) LIQD Take 237 mLs by mouth 3 (three) times daily between meals. 237 mL 12  . fluticasone (FLONASE) 50 MCG/ACT nasal spray Place 2 sprays into both nostrils daily as needed for allergies.     . hydrALAZINE (APRESOLINE) 50 MG tablet Take 1 tablet (50 mg total) by mouth every 8 (eight) hours. 90 tablet 0  . insulin glargine (LANTUS) 100 UNIT/ML injection Inject 0.1 mLs (10 Units total) into the skin daily. (Patient taking differently: Inject 10 Units into the skin at bedtime. ) 100 mL 0  . lamoTRIgine (LAMICTAL) 25 MG tablet Take 1 tablet (25 mg total) by mouth daily. 30 tablet 0  . levETIRAcetam (KEPPRA) 250 MG tablet Take 1 tablet (250 mg total) by mouth 2 (two) times daily. 30 tablet 0  . loratadine (CLARITIN) 10 MG tablet Take 10 mg by mouth daily.    . Melatonin 3 MG TABS Take 6 mg by mouth at bedtime.    . metFORMIN (GLUCOPHAGE) 500 MG tablet Take 1 tablet (500 mg total) by mouth 2 (two) times daily with a meal. 60 tablet 0  . metoprolol tartrate (LOPRESSOR) 25 MG tablet Take 1 tablet (25 mg total) by mouth 2 (two) times daily. 60 tablet  0  . nitrofurantoin (MACRODANTIN) 100 MG capsule Take 1 capsule by mouth.    . nystatin (NYSTATIN) powder Apply topically 2 (two) times daily.    Marland Kitchen OLANZapine (ZYPREXA) 10 MG tablet Take 10 mg by mouth at bedtime.    . ondansetron (ZOFRAN-ODT) 8 MG disintegrating tablet Take 8 mg by mouth every 8 (eight) hours as needed for nausea/vomiting.    . polyethylene glycol (MIRALAX / GLYCOLAX) packet Take 17 g by mouth daily. 14 each 0  . potassium chloride 20 MEQ TBCR Take 20 mEq by mouth daily. 30 tablet 0  . senna (SENNA-TIME) 8.6 MG tablet Take 1 tablet by mouth.    . torsemide (DEMADEX) 20 MG tablet Take 2 tablets (  40 mg total) by mouth daily. 30 tablet 0  . vitamin E 1000 UNIT capsule Take 1 capsule by mouth 1 day or 1 dose.     No current facility-administered medications for this visit.    Facility-Administered Medications Ordered in Other Visits  Medication Dose Route Frequency Provider Last Rate Last Dose  . 0.9 %  sodium chloride infusion    Continuous PRN Dionne Bucy, CRNA        OBJECTIVE: There were no vitals filed for this visit.   There is no height or weight on file to calculate BMI.    ECOG FS:1 - Symptomatic but completely ambulatory  General: Well-developed, well-nourished, no acute distress. Eyes: Pink conjunctiva, anicteric sclera. HEENT: Normocephalic, moist mucous membranes, clear oropharnyx. Lungs: Clear to auscultation bilaterally. Heart: Regular rate and rhythm. No rubs, murmurs, or gallops. Abdomen: Soft, nontender, nondistended. No organomegaly noted, normoactive bowel sounds. Musculoskeletal: No edema, cyanosis, or clubbing. Neuro: Alert, answering all questions appropriately. Cranial nerves grossly intact. Skin: No rashes or petechiae noted. Psych: Normal affect. Lymphatics: No cervical, calvicular, axillary or inguinal LAD.   LAB RESULTS:  Lab Results  Component Value Date   NA 138 09/28/2018   K 4.6 09/28/2018   CL 95 (L) 09/28/2018   CO2 25  09/28/2018   GLUCOSE 245 (H) 09/28/2018   BUN 33 (H) 09/28/2018   CREATININE 2.69 (H) 09/28/2018   CALCIUM 9.6 09/28/2018   PROT 7.1 09/28/2018   ALBUMIN 4.0 09/28/2018   AST 14 09/28/2018   ALT 4 09/28/2018   ALKPHOS 85 09/28/2018   BILITOT 0.3 09/28/2018   GFRNONAA 17 (L) 09/28/2018   GFRAA 19 (L) 09/28/2018    Lab Results  Component Value Date   WBC 4.9 10/23/2018   NEUTROABS 5.8 08/07/2018   HGB 7.8 (L) 10/23/2018   HCT 25.6 (L) 10/23/2018   MCV 95.5 10/23/2018   PLT 198 10/23/2018   Lab Results  Component Value Date   IRON 46 10/23/2018   TIBC 317 10/23/2018   IRONPCTSAT 15 10/23/2018   Lab Results  Component Value Date   FERRITIN 127 10/23/2018     STUDIES: US Liver Doppler  Result Date: 10/17/2018 CLINICAL DATA:  78 year old female with possible hepatic cirrhosis EXAM: DUPLEX ULTRASOUND OF LIVER TECHNIQUE: Color and duplex Doppler ultrasound was performed to evaluate the hepatic in-flow and out-flow vessels. COMPARISON:  None. FINDINGS: Liver: Normal parenchymal echogenicity. Normal hepatic contour without nodularity. No focal lesion, mass or intrahepatic biliary ductal dilatation. Portal Vein Velocities Main:  29-35 cm/sec with normal hepatopetal directional flow. Right:  26 cm/sec with normal hepatopetal directional flow. Left:  19 cm/sec with normal hepatopetal directional flow. Hepatic Vein Velocities Right:  32 cm/sec Middle:  19 cm/sec Left:  28 cm/sec IVC: Present and patent with normal respiratory phasicity. Hepatic Artery Velocity:  81 cm/sec Splenic Vein Velocity:  29 cm/sec Varices: None Ascites: None Spleen: 10.9 x 8.2 x 13.8 cm for a total volume of 643 cubic cm which is enlarged. IMPRESSION: 1. Widely patent hepatic and portal veins with normal directional flow and no evidence of portal venous hypertension. 2. Normal sonographic appearance of the liver. No overt morphologic changes of cirrhosis. 3. Splenomegaly of indeterminate etiology. Signed, Criselda Peaches, MD, [facility] Electronically Signed   By: Jacqulynn Cadet M.D.   On: 10/17/2018 15:05    ASSESSMENT: Anemia, possibly related to chronic renal insufficiency.  PLAN:    1.  Anemia: Patient's hemoglobin  is significantly decreased, but her iron stores are within normal limits.  Her erythropoietin level and reticulocyte count are inappropriately normal.  She also has a mildly decreased B12 level.  Given her chronic renal insufficiency, patient will benefit from Procrit.  She does not require IV Feraheme at this time.  Can consider bone marrow biopsy in the future, but this is not necessary at this time.  Return to clinic in 2 to 3 weeks for laboratory work and initiation of Procrit.  I spent a total of 30 minutes face-to-face with the patient of which greater than 50% of the visit was spent in counseling and coordination of care as detailed above.  Patient expressed understanding and was in agreement with this plan. She also understands that She can call clinic at any time with any questions, concerns, or complaints.    Lloyd Huger, MD   11/05/2018 8:22 AM

## 2018-11-06 ENCOUNTER — Inpatient Hospital Stay: Payer: Medicare Other

## 2018-11-06 ENCOUNTER — Telehealth: Payer: Self-pay | Admitting: Oncology

## 2018-11-06 ENCOUNTER — Other Ambulatory Visit: Payer: Self-pay | Admitting: *Deleted

## 2018-11-06 ENCOUNTER — Inpatient Hospital Stay: Payer: Medicare Other | Admitting: Oncology

## 2018-11-06 DIAGNOSIS — D509 Iron deficiency anemia, unspecified: Secondary | ICD-10-CM

## 2018-11-06 NOTE — Progress Notes (Signed)
cbc

## 2018-11-06 NOTE — Telephone Encounter (Signed)
Called patient to [facility]/Dr. Woodfin Ganja + RATACRIT Inj and patient declined multiple scheduling attempts.  kendra notified.

## 2018-11-13 ENCOUNTER — Ambulatory Visit: Payer: Medicare Other

## 2018-11-13 ENCOUNTER — Other Ambulatory Visit: Payer: Medicare Other

## 2018-11-13 ENCOUNTER — Ambulatory Visit: Payer: Medicare Other | Admitting: Oncology

## 2018-11-15 ENCOUNTER — Ambulatory Visit: Payer: Medicare Other | Admitting: Gastroenterology

## 2018-11-15 ENCOUNTER — Encounter: Payer: Self-pay | Admitting: Gastroenterology

## 2018-12-11 ENCOUNTER — Ambulatory Visit: Payer: Medicare Other | Admitting: Oncology

## 2018-12-11 ENCOUNTER — Other Ambulatory Visit: Payer: Medicare Other

## 2019-03-02 ENCOUNTER — Inpatient Hospital Stay
Admission: EM | Admit: 2019-03-02 | Discharge: 2019-03-19 | DRG: 673 | Disposition: A | Discharge status: Home Health | Payer: Medicare Other | Source: Skilled Nursing Facility | Attending: Internal Medicine | Admitting: Internal Medicine

## 2019-03-02 ENCOUNTER — Inpatient Hospital Stay: Payer: Medicare Other

## 2019-03-02 ENCOUNTER — Other Ambulatory Visit: Payer: Self-pay

## 2019-03-02 ENCOUNTER — Encounter: Payer: Self-pay | Admitting: Emergency Medicine

## 2019-03-02 ENCOUNTER — Emergency Department: Payer: Medicare Other

## 2019-03-02 ENCOUNTER — Inpatient Hospital Stay (HOSPITAL_COMMUNITY)
Admit: 2019-03-02 | Discharge: 2019-03-02 | Disposition: A | Discharge status: Home / Self Care | Payer: Medicare Other | Attending: Internal Medicine | Admitting: Internal Medicine

## 2019-03-02 DIAGNOSIS — R0602 Shortness of breath: Secondary | ICD-10-CM | POA: Diagnosis present

## 2019-03-02 DIAGNOSIS — I071 Rheumatic tricuspid insufficiency: Secondary | ICD-10-CM | POA: Diagnosis present

## 2019-03-02 DIAGNOSIS — Z888 Allergy status to other drugs, medicaments and biological substances status: Secondary | ICD-10-CM

## 2019-03-02 DIAGNOSIS — G4733 Obstructive sleep apnea (adult) (pediatric): Secondary | ICD-10-CM | POA: Diagnosis present

## 2019-03-02 DIAGNOSIS — E785 Hyperlipidemia, unspecified: Secondary | ICD-10-CM | POA: Diagnosis present

## 2019-03-02 DIAGNOSIS — N189 Chronic kidney disease, unspecified: Secondary | ICD-10-CM

## 2019-03-02 DIAGNOSIS — N2581 Secondary hyperparathyroidism of renal origin: Secondary | ICD-10-CM | POA: Diagnosis not present

## 2019-03-02 DIAGNOSIS — Z6839 Body mass index (BMI) 39.0-39.9, adult: Secondary | ICD-10-CM

## 2019-03-02 DIAGNOSIS — N186 End stage renal disease: Secondary | ICD-10-CM | POA: Diagnosis not present

## 2019-03-02 DIAGNOSIS — F419 Anxiety disorder, unspecified: Secondary | ICD-10-CM | POA: Diagnosis present

## 2019-03-02 DIAGNOSIS — I5033 Acute on chronic diastolic (congestive) heart failure: Secondary | ICD-10-CM

## 2019-03-02 DIAGNOSIS — N184 Chronic kidney disease, stage 4 (severe): Secondary | ICD-10-CM

## 2019-03-02 DIAGNOSIS — I132 Hypertensive heart and chronic kidney disease with heart failure and with stage 5 chronic kidney disease, or end stage renal disease: Secondary | ICD-10-CM | POA: Diagnosis present

## 2019-03-02 DIAGNOSIS — Z20828 Contact with and (suspected) exposure to other viral communicable diseases: Secondary | ICD-10-CM | POA: Diagnosis present

## 2019-03-02 DIAGNOSIS — Z87442 Personal history of urinary calculi: Secondary | ICD-10-CM

## 2019-03-02 DIAGNOSIS — K5900 Constipation, unspecified: Secondary | ICD-10-CM | POA: Diagnosis not present

## 2019-03-02 DIAGNOSIS — I5032 Chronic diastolic (congestive) heart failure: Secondary | ICD-10-CM

## 2019-03-02 DIAGNOSIS — N179 Acute kidney failure, unspecified: Principal | ICD-10-CM

## 2019-03-02 DIAGNOSIS — I361 Nonrheumatic tricuspid (valve) insufficiency: Secondary | ICD-10-CM

## 2019-03-02 DIAGNOSIS — I248 Other forms of acute ischemic heart disease: Secondary | ICD-10-CM | POA: Diagnosis present

## 2019-03-02 DIAGNOSIS — E1122 Type 2 diabetes mellitus with diabetic chronic kidney disease: Secondary | ICD-10-CM | POA: Diagnosis present

## 2019-03-02 DIAGNOSIS — I340 Nonrheumatic mitral (valve) insufficiency: Secondary | ICD-10-CM | POA: Diagnosis not present

## 2019-03-02 DIAGNOSIS — M549 Dorsalgia, unspecified: Secondary | ICD-10-CM | POA: Diagnosis not present

## 2019-03-02 DIAGNOSIS — D631 Anemia in chronic kidney disease: Secondary | ICD-10-CM | POA: Diagnosis present

## 2019-03-02 DIAGNOSIS — Z91040 Latex allergy status: Secondary | ICD-10-CM

## 2019-03-02 DIAGNOSIS — I2721 Secondary pulmonary arterial hypertension: Secondary | ICD-10-CM | POA: Diagnosis present

## 2019-03-02 DIAGNOSIS — I272 Pulmonary hypertension, unspecified: Secondary | ICD-10-CM | POA: Diagnosis not present

## 2019-03-02 DIAGNOSIS — I130 Hypertensive heart and chronic kidney disease with heart failure and stage 1 through stage 4 chronic kidney disease, or unspecified chronic kidney disease: Secondary | ICD-10-CM | POA: Diagnosis not present

## 2019-03-02 DIAGNOSIS — Z794 Long term (current) use of insulin: Secondary | ICD-10-CM

## 2019-03-02 DIAGNOSIS — I509 Heart failure, unspecified: Secondary | ICD-10-CM | POA: Diagnosis not present

## 2019-03-02 DIAGNOSIS — Z87440 Personal history of urinary (tract) infections: Secondary | ICD-10-CM

## 2019-03-02 DIAGNOSIS — Z79899 Other long term (current) drug therapy: Secondary | ICD-10-CM

## 2019-03-02 DIAGNOSIS — Z992 Dependence on renal dialysis: Secondary | ICD-10-CM

## 2019-03-02 DIAGNOSIS — Z87891 Personal history of nicotine dependence: Secondary | ICD-10-CM

## 2019-03-02 DIAGNOSIS — Z885 Allergy status to narcotic agent status: Secondary | ICD-10-CM

## 2019-03-02 DIAGNOSIS — Z91048 Other nonmedicinal substance allergy status: Secondary | ICD-10-CM

## 2019-03-02 DIAGNOSIS — J9611 Chronic respiratory failure with hypoxia: Secondary | ICD-10-CM | POA: Diagnosis present

## 2019-03-02 DIAGNOSIS — E669 Obesity, unspecified: Secondary | ICD-10-CM | POA: Diagnosis present

## 2019-03-02 DIAGNOSIS — Z111 Encounter for screening for respiratory tuberculosis: Secondary | ICD-10-CM

## 2019-03-02 DIAGNOSIS — I2510 Atherosclerotic heart disease of native coronary artery without angina pectoris: Secondary | ICD-10-CM | POA: Diagnosis present

## 2019-03-02 DIAGNOSIS — Z9981 Dependence on supplemental oxygen: Secondary | ICD-10-CM | POA: Diagnosis not present

## 2019-03-02 DIAGNOSIS — E1165 Type 2 diabetes mellitus with hyperglycemia: Secondary | ICD-10-CM | POA: Diagnosis not present

## 2019-03-02 DIAGNOSIS — N3020 Other chronic cystitis without hematuria: Secondary | ICD-10-CM | POA: Diagnosis present

## 2019-03-02 DIAGNOSIS — Z887 Allergy status to serum and vaccine status: Secondary | ICD-10-CM

## 2019-03-02 DIAGNOSIS — F329 Major depressive disorder, single episode, unspecified: Secondary | ICD-10-CM | POA: Diagnosis present

## 2019-03-02 DIAGNOSIS — N183 Chronic kidney disease, stage 3 (moderate): Secondary | ICD-10-CM | POA: Diagnosis not present

## 2019-03-02 DIAGNOSIS — Z882 Allergy status to sulfonamides status: Secondary | ICD-10-CM

## 2019-03-02 DIAGNOSIS — R778 Other specified abnormalities of plasma proteins: Secondary | ICD-10-CM

## 2019-03-02 DIAGNOSIS — Z79890 Hormone replacement therapy (postmenopausal): Secondary | ICD-10-CM

## 2019-03-02 DIAGNOSIS — E875 Hyperkalemia: Secondary | ICD-10-CM | POA: Diagnosis present

## 2019-03-02 DIAGNOSIS — Z881 Allergy status to other antibiotic agents status: Secondary | ICD-10-CM

## 2019-03-02 DIAGNOSIS — Z7951 Long term (current) use of inhaled steroids: Secondary | ICD-10-CM

## 2019-03-02 HISTORY — DX: Atherosclerotic heart disease of native coronary artery without angina pectoris: I25.10

## 2019-03-02 HISTORY — DX: Gastrointestinal hemorrhage, unspecified: K92.2

## 2019-03-02 HISTORY — DX: Unspecified diastolic (congestive) heart failure: I50.30

## 2019-03-02 HISTORY — DX: Chest pain, unspecified: R07.9

## 2019-03-02 HISTORY — DX: Chronic kidney disease, stage 4 (severe): N18.4

## 2019-03-02 LAB — COMPREHENSIVE METABOLIC PANEL
ALT: 8 U/L (ref 0–44)
AST: 12 U/L — ABNORMAL LOW (ref 15–41)
Albumin: 3.5 g/dL (ref 3.5–5.0)
Alkaline Phosphatase: 100 U/L (ref 38–126)
Anion gap: 8 (ref 5–15)
BUN: 67 mg/dL — ABNORMAL HIGH (ref 8–23)
CO2: 28 mmol/L (ref 22–32)
Calcium: 8.9 mg/dL (ref 8.9–10.3)
Chloride: 100 mmol/L (ref 98–111)
Creatinine, Ser: 3.48 mg/dL — ABNORMAL HIGH (ref 0.44–1.00)
GFR calc Af Amer: 14 mL/min — ABNORMAL LOW (ref >60)
GFR calc non Af Amer: 12 mL/min — ABNORMAL LOW (ref >60)
Glucose, Bld: 222 mg/dL — ABNORMAL HIGH (ref 70–99)
Potassium: 5.1 mmol/L (ref 3.5–5.1)
Sodium: 136 mmol/L (ref 135–145)
Total Bilirubin: 0.4 mg/dL (ref 0.3–1.2)
Total Protein: 7.2 g/dL (ref 6.5–8.1)

## 2019-03-02 LAB — BASIC METABOLIC PANEL
Anion gap: 9 (ref 5–15)
BUN: 69 mg/dL — ABNORMAL HIGH (ref 8–23)
CO2: 27 mmol/L (ref 22–32)
Calcium: 8.8 mg/dL — ABNORMAL LOW (ref 8.9–10.3)
Chloride: 100 mmol/L (ref 98–111)
Creatinine, Ser: 3.54 mg/dL — ABNORMAL HIGH (ref 0.44–1.00)
GFR calc Af Amer: 14 mL/min — ABNORMAL LOW (ref >60)
GFR calc non Af Amer: 12 mL/min — ABNORMAL LOW (ref >60)
Glucose, Bld: 228 mg/dL — ABNORMAL HIGH (ref 70–99)
Potassium: 5 mmol/L (ref 3.5–5.1)
Sodium: 136 mmol/L (ref 135–145)

## 2019-03-02 LAB — CBC
HCT: 29 % — ABNORMAL LOW (ref 36.0–46.0)
Hemoglobin: 9 g/dL — ABNORMAL LOW (ref 12.0–15.0)
MCH: 28.8 pg (ref 26.0–34.0)
MCHC: 31 g/dL (ref 30.0–36.0)
MCV: 92.9 fL (ref 80.0–100.0)
Platelets: 268 10*3/uL (ref 150–400)
RBC: 3.12 MIL/uL — ABNORMAL LOW (ref 3.87–5.11)
RDW: 14.8 % (ref 11.5–15.5)
WBC: 9 10*3/uL (ref 4.0–10.5)
nRBC: 0 % (ref 0.0–0.2)

## 2019-03-02 LAB — TROPONIN I
Troponin I: 0.31 ng/mL (ref <0.03)
Troponin I: 0.28 ng/mL (ref <0.03)
Troponin I: 0.26 ng/mL (ref <0.03)
Troponin I: 0.31 ng/mL (ref <0.03)

## 2019-03-02 LAB — ECHOCARDIOGRAM COMPLETE
Height: 67 in
Weight: 4233.6 oz

## 2019-03-02 LAB — SARS CORONAVIRUS 2 BY RT PCR (HOSPITAL ORDER, PERFORMED IN ~~LOC~~ HOSPITAL LAB): SARS Coronavirus 2: NEGATIVE

## 2019-03-02 LAB — BRAIN NATRIURETIC PEPTIDE: B Natriuretic Peptide: 867 pg/mL — ABNORMAL HIGH (ref 0.0–100.0)

## 2019-03-02 LAB — GLUCOSE, CAPILLARY
Glucose-Capillary: 203 mg/dL — ABNORMAL HIGH (ref 70–99)
Glucose-Capillary: 177 mg/dL — ABNORMAL HIGH (ref 70–99)
Glucose-Capillary: 200 mg/dL — ABNORMAL HIGH (ref 70–99)

## 2019-03-02 MED ORDER — INSULIN ASPART 100 UNIT/ML ~~LOC~~ SOLN
0.0000 [IU] | Freq: Three times a day (TID) | SUBCUTANEOUS | Status: DC
  Administered 2019-03-02 – 2019-03-03 (×2): 2 [IU] via SUBCUTANEOUS
  Administered 2019-03-04: 5 [IU] via SUBCUTANEOUS
  Administered 2019-03-04: 2 [IU] via SUBCUTANEOUS
  Administered 2019-03-04: 3 [IU] via SUBCUTANEOUS
  Administered 2019-03-05: 2 [IU] via SUBCUTANEOUS
  Administered 2019-03-05: 7 [IU] via SUBCUTANEOUS
  Administered 2019-03-05: 5 [IU] via SUBCUTANEOUS
  Administered 2019-03-06: 3 [IU] via SUBCUTANEOUS
  Administered 2019-03-06 (×2): 7 [IU] via SUBCUTANEOUS
  Administered 2019-03-07: 5 [IU] via SUBCUTANEOUS
  Administered 2019-03-07: 2 [IU] via SUBCUTANEOUS
  Administered 2019-03-07: 18:00:00 3 [IU] via SUBCUTANEOUS
  Administered 2019-03-08: 5 [IU] via SUBCUTANEOUS
  Administered 2019-03-08: 12:00:00 3 [IU] via SUBCUTANEOUS
  Administered 2019-03-08: 09:00:00 1 [IU] via SUBCUTANEOUS
  Administered 2019-03-09: 18:00:00 7 [IU] via SUBCUTANEOUS
  Administered 2019-03-09: 3 [IU] via SUBCUTANEOUS
  Administered 2019-03-10: 5 [IU] via SUBCUTANEOUS
  Administered 2019-03-10 – 2019-03-11 (×2): 3 [IU] via SUBCUTANEOUS
  Administered 2019-03-11: 5 [IU] via SUBCUTANEOUS
  Administered 2019-03-11: 3 [IU] via SUBCUTANEOUS
  Administered 2019-03-12: 14:00:00 2 [IU] via SUBCUTANEOUS
  Administered 2019-03-12 – 2019-03-13 (×2): 3 [IU] via SUBCUTANEOUS
  Administered 2019-03-13: 09:00:00 2 [IU] via SUBCUTANEOUS
  Administered 2019-03-13: 1 [IU] via SUBCUTANEOUS
  Administered 2019-03-14: 12:00:00 3 [IU] via SUBCUTANEOUS
  Administered 2019-03-14: 17:00:00 5 [IU] via SUBCUTANEOUS
  Administered 2019-03-14: 3 [IU] via SUBCUTANEOUS
  Administered 2019-03-15: 5 [IU] via SUBCUTANEOUS
  Administered 2019-03-15: 2 [IU] via SUBCUTANEOUS
  Administered 2019-03-15: 18:00:00 5 [IU] via SUBCUTANEOUS
  Administered 2019-03-16: 2 [IU] via SUBCUTANEOUS
  Administered 2019-03-16: 3 [IU] via SUBCUTANEOUS
  Administered 2019-03-16: 2 [IU] via SUBCUTANEOUS
  Administered 2019-03-17: 3 [IU] via SUBCUTANEOUS
  Administered 2019-03-17 – 2019-03-18 (×3): 5 [IU] via SUBCUTANEOUS
  Administered 2019-03-18: 9 [IU] via SUBCUTANEOUS
  Administered 2019-03-18: 3 [IU] via SUBCUTANEOUS
  Administered 2019-03-19: 2 [IU] via SUBCUTANEOUS
  Administered 2019-03-19: 3 [IU] via SUBCUTANEOUS
  Filled 2019-03-02 (×46): qty 1

## 2019-03-02 MED ORDER — GLIPIZIDE 5 MG PO TABS
5.0000 mg | ORAL_TABLET | Freq: Every day | ORAL | Status: DC
  Filled 2019-03-02: qty 1

## 2019-03-02 MED ORDER — ASPIRIN 81 MG PO CHEW
81.0000 mg | CHEWABLE_TABLET | Freq: Every day | ORAL | Status: DC
  Administered 2019-03-02 – 2019-03-18 (×17): 81 mg via ORAL
  Filled 2019-03-02 (×17): qty 1

## 2019-03-02 MED ORDER — ATORVASTATIN CALCIUM 20 MG PO TABS
40.0000 mg | ORAL_TABLET | Freq: Every day | ORAL | Status: DC
  Administered 2019-03-02 – 2019-03-18 (×16): 40 mg via ORAL
  Filled 2019-03-02 (×18): qty 2

## 2019-03-02 MED ORDER — CITALOPRAM HYDROBROMIDE 20 MG PO TABS
10.0000 mg | ORAL_TABLET | Freq: Every day | ORAL | Status: DC
  Administered 2019-03-02 – 2019-03-18 (×17): 10 mg via ORAL
  Filled 2019-03-02 (×17): qty 1

## 2019-03-02 MED ORDER — FUROSEMIDE 10 MG/ML IJ SOLN
40.0000 mg | Freq: Once | INTRAMUSCULAR | Status: AC
  Administered 2019-03-02: 08:00:00 40 mg via INTRAVENOUS
  Filled 2019-03-02: qty 4

## 2019-03-02 MED ORDER — PATIROMER SORBITEX CALCIUM 8.4 G PO PACK
16.8000 g | PACK | Freq: Once | ORAL | Status: AC
  Administered 2019-03-02: 11:00:00 16.8 g via ORAL
  Filled 2019-03-02: qty 2

## 2019-03-02 MED ORDER — VITAMIN B-12 1000 MCG PO TABS
1000.0000 ug | ORAL_TABLET | Freq: Every day | ORAL | Status: DC
  Administered 2019-03-03 – 2019-03-18 (×16): 1000 ug via ORAL
  Filled 2019-03-02 (×16): qty 1

## 2019-03-02 MED ORDER — ADULT MULTIVITAMIN W/MINERALS CH
1.0000 | ORAL_TABLET | Freq: Every day | ORAL | Status: DC
  Administered 2019-03-03 – 2019-03-12 (×10): 1 via ORAL
  Filled 2019-03-02 (×11): qty 1

## 2019-03-02 MED ORDER — LEVETIRACETAM 250 MG PO TABS
250.0000 mg | ORAL_TABLET | Freq: Two times a day (BID) | ORAL | Status: DC
  Administered 2019-03-02 – 2019-03-18 (×34): 250 mg via ORAL
  Filled 2019-03-02 (×37): qty 1

## 2019-03-02 MED ORDER — POLYETHYLENE GLYCOL 3350 17 G PO PACK
17.0000 g | PACK | Freq: Every day | ORAL | Status: DC
  Administered 2019-03-03 – 2019-03-18 (×12): 17 g via ORAL
  Filled 2019-03-02 (×13): qty 1

## 2019-03-02 MED ORDER — NYSTATIN 100000 UNIT/GM EX POWD
Freq: Two times a day (BID) | CUTANEOUS | Status: DC
  Administered 2019-03-02 – 2019-03-19 (×27): via TOPICAL
  Filled 2019-03-02 (×3): qty 15

## 2019-03-02 MED ORDER — ALBUTEROL SULFATE HFA 108 (90 BASE) MCG/ACT IN AERS: 2.0000 | INHALATION_SPRAY | Freq: Four times a day (QID) | RESPIRATORY_TRACT | Status: DC | PRN

## 2019-03-02 MED ORDER — METOPROLOL TARTRATE 50 MG PO TABS
50.0000 mg | ORAL_TABLET | Freq: Two times a day (BID) | ORAL | Status: DC
  Administered 2019-03-02: 50 mg via ORAL
  Filled 2019-03-02: qty 1

## 2019-03-02 MED ORDER — ALBUTEROL SULFATE (2.5 MG/3ML) 0.083% IN NEBU: 2.5000 mg | INHALATION_SOLUTION | Freq: Four times a day (QID) | RESPIRATORY_TRACT | Status: DC | PRN

## 2019-03-02 MED ORDER — SENNOSIDES-DOCUSATE SODIUM 8.6-50 MG PO TABS
1.0000 | ORAL_TABLET | Freq: Every day | ORAL | Status: DC
  Administered 2019-03-02 – 2019-03-18 (×17): 1 via ORAL
  Filled 2019-03-02 (×17): qty 1

## 2019-03-02 MED ORDER — LORATADINE 10 MG PO TABS
10.0000 mg | ORAL_TABLET | Freq: Every day | ORAL | Status: DC
  Administered 2019-03-03 – 2019-03-18 (×16): 10 mg via ORAL
  Filled 2019-03-02 (×16): qty 1

## 2019-03-02 MED ORDER — FLUTICASONE PROPIONATE 50 MCG/ACT NA SUSP
2.0000 | Freq: Every day | NASAL | Status: DC
  Administered 2019-03-02 – 2019-03-18 (×18): 2 via NASAL
  Filled 2019-03-02: qty 16

## 2019-03-02 MED ORDER — DOCUSATE SODIUM 100 MG PO CAPS
100.0000 mg | ORAL_CAPSULE | Freq: Two times a day (BID) | ORAL | Status: DC
  Administered 2019-03-02 – 2019-03-18 (×32): 100 mg via ORAL
  Filled 2019-03-02 (×32): qty 1

## 2019-03-02 MED ORDER — INSULIN ASPART 100 UNIT/ML ~~LOC~~ SOLN
0.0000 [IU] | Freq: Every day | SUBCUTANEOUS | Status: DC
  Administered 2019-03-04 – 2019-03-06 (×2): 2 [IU] via SUBCUTANEOUS
  Administered 2019-03-06: 4 [IU] via SUBCUTANEOUS
  Administered 2019-03-07 – 2019-03-11 (×3): 2 [IU] via SUBCUTANEOUS
  Administered 2019-03-12 – 2019-03-18 (×4): 3 [IU] via SUBCUTANEOUS
  Filled 2019-03-02 (×10): qty 1

## 2019-03-02 MED ORDER — CLONAZEPAM 0.125 MG PO TBDP
0.2500 mg | ORAL_TABLET | Freq: Two times a day (BID) | ORAL | Status: DC | PRN
  Administered 2019-03-02 – 2019-03-18 (×15): 0.25 mg via ORAL
  Filled 2019-03-02 (×15): qty 2

## 2019-03-02 MED ORDER — MELATONIN 5 MG PO TABS
5.0000 mg | ORAL_TABLET | Freq: Every day | ORAL | Status: DC
  Administered 2019-03-02 – 2019-03-18 (×17): 5 mg via ORAL
  Filled 2019-03-02 (×18): qty 1

## 2019-03-02 MED ORDER — ACETAMINOPHEN 325 MG PO TABS
650.0000 mg | ORAL_TABLET | Freq: Four times a day (QID) | ORAL | Status: DC | PRN
  Administered 2019-03-02 – 2019-03-16 (×7): 650 mg via ORAL
  Filled 2019-03-02 (×7): qty 2

## 2019-03-02 MED ORDER — DOCUSATE SODIUM 100 MG PO CAPS: 100.0000 mg | ORAL_CAPSULE | Freq: Two times a day (BID) | ORAL | Status: DC | PRN

## 2019-03-02 MED ORDER — INSULIN GLARGINE 100 UNIT/ML ~~LOC~~ SOLN
20.0000 [IU] | Freq: Every day | SUBCUTANEOUS | Status: DC
  Administered 2019-03-02: 20 [IU] via SUBCUTANEOUS
  Filled 2019-03-02 (×2): qty 0.2

## 2019-03-02 MED ORDER — PROMETHAZINE HCL 25 MG/ML IJ SOLN
12.5000 mg | Freq: Four times a day (QID) | INTRAMUSCULAR | Status: DC | PRN
  Administered 2019-03-04 – 2019-03-15 (×4): 12.5 mg via INTRAVENOUS
  Filled 2019-03-02 (×4): qty 1

## 2019-03-02 MED ORDER — HEPARIN SODIUM (PORCINE) 5000 UNIT/ML IJ SOLN
5000.0000 [IU] | Freq: Three times a day (TID) | INTRAMUSCULAR | Status: DC
  Administered 2019-03-02 – 2019-03-19 (×49): 5000 [IU] via SUBCUTANEOUS
  Filled 2019-03-02 (×48): qty 1

## 2019-03-02 MED ORDER — FUROSEMIDE 10 MG/ML IJ SOLN: 20.0000 mg | Freq: Three times a day (TID) | INTRAMUSCULAR | Status: DC

## 2019-03-02 MED ORDER — ENSURE ENLIVE PO LIQD
237.0000 mL | Freq: Two times a day (BID) | ORAL | Status: DC
  Administered 2019-03-02 – 2019-03-08 (×10): 237 mL via ORAL

## 2019-03-02 MED ORDER — PANTOPRAZOLE SODIUM 40 MG PO TBEC
40.0000 mg | DELAYED_RELEASE_TABLET | Freq: Two times a day (BID) | ORAL | Status: DC
  Administered 2019-03-02 – 2019-03-18 (×32): 40 mg via ORAL
  Filled 2019-03-02 (×32): qty 1

## 2019-03-02 MED ORDER — ONDANSETRON 8 MG PO TBDP
8.0000 mg | ORAL_TABLET | Freq: Three times a day (TID) | ORAL | Status: DC | PRN
  Administered 2019-03-02 – 2019-03-15 (×10): 8 mg via ORAL
  Filled 2019-03-02 (×11): qty 1

## 2019-03-02 MED ORDER — OLANZAPINE 10 MG PO TABS
10.0000 mg | ORAL_TABLET | Freq: Every day | ORAL | Status: DC
  Administered 2019-03-02 – 2019-03-18 (×17): 10 mg via ORAL
  Filled 2019-03-02 (×18): qty 1

## 2019-03-02 MED ORDER — LAMOTRIGINE 25 MG PO TABS
25.0000 mg | ORAL_TABLET | Freq: Two times a day (BID) | ORAL | Status: DC
  Administered 2019-03-02 – 2019-03-18 (×34): 25 mg via ORAL
  Filled 2019-03-02 (×36): qty 1

## 2019-03-02 MED ORDER — FUROSEMIDE 10 MG/ML IJ SOLN
4.0000 mg/h | INTRAVENOUS | Status: DC
  Administered 2019-03-02: 8 mg/h via INTRAVENOUS
  Administered 2019-03-03 – 2019-03-06 (×3): 10 mg/h via INTRAVENOUS
  Administered 2019-03-07: 6 mg/h via INTRAVENOUS
  Administered 2019-03-08 – 2019-03-10 (×2): 4 mg/h via INTRAVENOUS
  Filled 2019-03-02 (×7): qty 25

## 2019-03-02 MED ORDER — HYDRALAZINE HCL 50 MG PO TABS
50.0000 mg | ORAL_TABLET | Freq: Three times a day (TID) | ORAL | Status: DC
  Administered 2019-03-02 – 2019-03-03 (×2): 50 mg via ORAL
  Filled 2019-03-02 (×2): qty 1

## 2019-03-02 NOTE — ED Notes (Signed)
Pt given [place] sandwich, applesauce, and graham crackers to eat

## 2019-03-02 NOTE — Progress Notes (Signed)
Came with worsening SOB for last 2-3 days- 3 ltr oxygen at baseline. No fever/ cough- COVID-19 test negative.  Xray  Suggest CHF, worsenign in renal func on CKD  Admit for CHF management, IV lasix, IO monitor, Echo and cardiology consult. Elevated troponin may be due to stress induced.

## 2019-03-02 NOTE — ED Notes (Signed)
Output of 174ml noted

## 2019-03-02 NOTE — Progress Notes (Signed)
Family Meeting Note  Advance Directive:yes  Today a meeting took place with the Patient.  The following clinical team members were present during this meeting:MD  The following were discussed:Patient's diagnosis: CHF, acute on chronic kidney disease, diabetes, hypertension, Patient's progosis: Unable to determine and Goals for treatment: Full Code  Additional follow-up to be provided: Cardiologist  Time spent during discussion:20 minutes  Vaughan Basta, MD

## 2019-03-02 NOTE — Progress Notes (Signed)
Talked to Dr. Jannifer Franklin about patient's BP of 110/41 was 146/41 prior to, patient on a lasix drip, she also has a metoprolol p.o, order to hold dose. RN will continue to monitor.

## 2019-03-02 NOTE — Progress Notes (Signed)
Inpatient Diabetes Program Recommendations  AACE/ADA: New Consensus Statement on Inpatient Glycemic Control (2015)  Target Ranges:  Prepandial:   less than 140 mg/dL      Peak postprandial:   less than 180 mg/dL (1-2 hours)      Critically ill patients:  140 - 180 mg/dL   Lab Results  Component Value Date   GLUCAP 203 (H) 03/02/2019   HGBA1C 7.6 (H) 04/22/2018    Review of Glycemic Control Results for NECHAMA, ESCUTIA (MRN [id number]) as of 03/02/2019 12:38  Ref. Range 03/02/2019 12:25  Glucose-Capillary Latest Ref Range: 70 - 99 mg/dL 203 (H)   Diabetes history: DM 2 Outpatient Diabetes medications:  Glucotrol 5 mg daily, Lantus 55 unit q HS, Novolog tid with meals Current orders for Inpatient glycemic control:  Lantus 20 units daily, Glucotrol 5 mg daily  Inpatient Diabetes Program Recommendations:    Please consider adding Novolog sensitive tid with meals and HS.   Thanks,  Adah Perl, RN, BC-ADM Inpatient Diabetes Coordinator Pager [phone] (8a-5p)

## 2019-03-02 NOTE — ED Notes (Signed)
Attempted to call report- was told they will call me back

## 2019-03-02 NOTE — ED Notes (Signed)
ED TO INPATIENT HANDOFF REPORT  ED Nurse Name and Phone #: [phone] S Name/Age/Gender Carolyn Valdez 78 y.o. female Room/Bed: ED19A/ED19A  Code Status   Code Status: Prior  Home/SNF/Other Skilled nursing facility Patient oriented to: self, place, time and situation Is this baseline? Yes   Triage Complete: Triage complete  Chief Complaint shortness of breath   Triage Note Pt brought from [facility] for exertional SOB. Pt has increasing SOB for the last two days. Pt was unable to get comfortable this evening and never went to sleep. Pt has pitting edema but stating it is no different than her normal. Pt's saturation was dropping with ambulation per staff at [facility] Pt wears 3L [place] at home and BG was 241   Allergies Allergies  Allergen Reactions  . Sulfa Antibiotics Hives  . Biaxin [Clarithromycin] Hives  . Influenza A (H1n1) [place] Vac Other (See Comments)    Pt states that she was told by her MD not to get the influenza vaccine.    . Morphine Other (See Comments)    Reaction:  Dizziness and confusion   . Pyridium [Phenazopyridine Hcl] Other (See Comments)    Reaction:  Unknown   . Ceftriaxone Anxiety  . Latex Rash  . Prednisone Rash  . Tape Rash    Level of Care/Admitting Diagnosis ED Disposition    ED Disposition Condition [facility] Area: [place] [[id number]]  Level of Care: Med-Surg [16]  Covid Evaluation: N/A  Diagnosis: Acute on chronic diastolic CHF (congestive heart failure) [facility]) [[id number]]  Admitting Physician: Vaughan Basta [phone]  Attending Physician: Vaughan Basta [phone]  Estimated length of stay: past midnight tomorrow  Certification:: I certify this patient will need inpatient services for at least 2 midnights  PT Class (Do Not Modify): Inpatient [101]  PT Acc Code (Do Not Modify): Private [1]       B Medical/Surgery History Past Medical History:  Diagnosis Date  . (HFpEF) heart  failure with preserved ejection fraction ([place])   . Anxiety   . Breast cancer ([place]) 1997   right breast mastectomy  . Diabetes mellitus without complication ([place])   . DJD (degenerative joint disease)   . Dysuria   . H/O total knee replacement    right  . HTN (hypertension)   . Hypercholesteremia   . Kidney stone   . Obesity   . Recurrent urinary tract infection   . SVT (supraventricular tachycardia) ([place])   . Urinary incontinence   . Vaginal atrophy   . Yeast vaginitis    Past Surgical History:  Procedure Laterality Date  . CARDIAC CATHETERIZATION    . CHOLECYSTECTOMY    . COLONOSCOPY N/A 08/06/2018   Procedure: COLONOSCOPY;  Surgeon: Virgel Manifold, MD;  Location: [facility] ENDOSCOPY;  Service: Endoscopy;  Laterality: N/A;  . ESOPHAGOGASTRODUODENOSCOPY N/A 08/06/2018   Procedure: ESOPHAGOGASTRODUODENOSCOPY (EGD);  Surgeon: Virgel Manifold, MD;  Location[facility] ENDOSCOPY;  Service: Endoscopy;  Laterality: N/A;  . FOOT SURGERY    . JOINT REPLACEMENT    . MASTECTOMY Right 1997  . NASAL SEPTUM SURGERY    . PERIPHERAL VASCULAR CATHETERIZATION N/A 07/08/2015   Procedure: PICC Line Insertion;  Surgeon: Algernon Huxley, MD;  Location: [place] CV LAB;  Service: Cardiovascular;  Laterality: N/A;  . TONSILLECTOMY    . Vocal cords       A IV Location/Drains/Wounds Patient Lines/Drains/Airways Status   Active Line/Drains/Airways    Name:   Placement date:  Placement time:   Site:   Days:   Peripheral IV 03/02/19 Left Antecubital   03/02/19    0543    Antecubital   less than 1   External Urinary Catheter   04/22/18    1930    -   314   External Urinary Catheter   03/02/19    0736    -   less than 1          Intake/Output Last 24 hours No intake or output data in the 24 hours ending 03/02/19 1025  Labs/Imaging Results for orders placed or performed during the hospital encounter of 03/02/19 (from the past 48 hour(s))  Basic metabolic panel     Status: Abnormal    Collection Time: 03/02/19  5:30 AM  Result Value Ref Range   Sodium 136 135 - 145 mmol/L   Potassium 5.0 3.5 - 5.1 mmol/L   Chloride 100 98 - 111 mmol/L   CO2 27 22 - 32 mmol/L   Glucose, Bld 228 (H) 70 - 99 mg/dL   BUN 69 (H) 8 - 23 mg/dL   Creatinine, Ser 3.54 (H) 0.44 - 1.00 mg/dL   Calcium 8.8 (L) 8.9 - 10.3 mg/dL   GFR calc non Af Amer 12 (L) >60 mL/min   GFR calc Af Amer 14 (L) >60 mL/min   Anion gap 9 5 - 15    Comment: Performed at [facility], [address] [place], [place] [address]  CBC     Status: Abnormal   Collection Time: 03/02/19  5:30 AM  Result Value Ref Range   WBC 9.0 4.0 - 10.5 K/uL   RBC 3.12 (L) 3.87 - 5.11 MIL/uL   Hemoglobin 9.0 (L) 12.0 - 15.0 g/dL   HCT 29.0 (L) 36.0 - 46.0 %   MCV 92.9 80.0 - 100.0 fL   MCH 28.8 26.0 - 34.0 pg   MCHC 31.0 30.0 - 36.0 g/dL   RDW 14.8 11.5 - 15.5 %   Platelets 268 150 - 400 K/uL   nRBC 0.0 0.0 - 0.2 %    Comment: Performed at [facility], [address] [place], [place] [address]  Comprehensive metabolic panel     Status: Abnormal   Collection Time: 03/02/19  5:30 AM  Result Value Ref Range   Sodium 136 135 - 145 mmol/L   Potassium 5.1 3.5 - 5.1 mmol/L   Chloride 100 98 - 111 mmol/L   CO2 28 22 - 32 mmol/L   Glucose, Bld 222 (H) 70 - 99 mg/dL   BUN 67 (H) 8 - 23 mg/dL   Creatinine, Ser 3.48 (H) 0.44 - 1.00 mg/dL   Calcium 8.9 8.9 - 10.3 mg/dL   Total Protein 7.2 6.5 - 8.1 g/dL   Albumin 3.5 3.5 - 5.0 g/dL   AST 12 (L) 15 - 41 U/L   ALT 8 0 - 44 U/L   Alkaline Phosphatase 100 38 - 126 U/L   Total Bilirubin 0.4 0.3 - 1.2 mg/dL   GFR calc non Af Amer 12 (L) >60 mL/min   GFR calc Af Amer 14 (L) >60 mL/min   Anion gap 8 5 - 15    Comment: Performed at [facility], [address] [place], [place] [address]  Troponin I - Once     Status: Abnormal   Collection Time: 03/02/19  5:30 AM  Result Value Ref Range   Troponin I 0.31 (HH) <0.03 ng/mL    Comment: CRITICAL  RESULT CALLED  TO, READ BACK BY AND VERIFIED WITH Dezmon Conover WALLACE @0833  ON 03/02/2019 BY FMW Performed at [facility], [address] [place], [place] [address]   Brain natriuretic peptide     Status: Abnormal   Collection Time: 03/02/19  5:30 AM  Result Value Ref Range   B Natriuretic Peptide 867.0 (H) 0.0 - 100.0 pg/mL    Comment: Performed at [facility], [address] [place], [place] [address]  SARS Coronavirus 2 (CEPHEID - Performed in [place] hospital lab), Hosp Order     Status: None   Collection Time: 03/02/19  6:35 AM  Result Value Ref Range   SARS Coronavirus 2 NEGATIVE NEGATIVE    Comment: (NOTE) If result is NEGATIVE SARS-CoV-2 target nucleic acids are NOT DETECTED. The SARS-CoV-2 RNA is generally detectable in upper and lower  respiratory specimens during the acute phase of infection. The lowest  concentration of SARS-CoV-2 viral copies this assay can detect is 250  copies / mL. A negative result does not preclude SARS-CoV-2 infection  and should not be used as the sole basis for treatment or other  patient management decisions.  A negative result may occur with  improper specimen collection / handling, submission of specimen other  than nasopharyngeal swab, presence of viral mutation(s) within the  areas targeted by this assay, and inadequate number of viral copies  (<250 copies / mL). A negative result must be combined with clinical  observations, patient history, and epidemiological information. If result is POSITIVE SARS-CoV-2 target nucleic acids are DETECTED. The SARS-CoV-2 RNA is generally detectable in upper and lower  respiratory specimens dur ing the acute phase of infection.  Positive  results are indicative of active infection with SARS-CoV-2.  Clinical  correlation with patient history and other diagnostic information is  necessary to determine patient infection status.  Positive results do  not rule out bacterial infection or co-infection  with other viruses. If result is PRESUMPTIVE POSTIVE SARS-CoV-2 nucleic acids MAY BE PRESENT.   A presumptive positive result was obtained on the submitted specimen  and confirmed on repeat testing.  While 2019 novel coronavirus  (SARS-CoV-2) nucleic acids may be present in the submitted sample  additional confirmatory testing may be necessary for epidemiological  and / or clinical management purposes  to differentiate between  SARS-CoV-2 and other Sarbecovirus currently known to infect humans.  If clinically indicated additional testing with an alternate test  methodology [phone]) is advised. The SARS-CoV-2 RNA is generally  detectable in upper and lower respiratory sp ecimens during the acute  phase of infection. The expected result is Negative. Fact Sheet for Patients:  StrictlyIdeas.no Fact Sheet for Healthcare Providers: BankingDealers.co.za This test is not yet approved or cleared by the [place] FDA and has been authorized for detection and/or diagnosis of SARS-CoV-2 by FDA under an Emergency Use Authorization (EUA).  This EUA will remain in effect (meaning this test can be used) for the duration of the COVID-19 declaration under Section 564(b)(1) of the Act, 21 U.S.C. section 360bbb-3(b)(1), unless the authorization is terminated or revoked sooner. Performed at [facility], [address] [place], [place] [address]    Dg Chest 2 View  Result Date: 03/02/2019 CLINICAL DATA:  78 year old female with shortness of breath for 2 days. Former smoker. EXAM: CHEST - 2 VIEW COMPARISON:  Portable chest 08/03/2018 and earlier. FINDINGS: Seated AP and lateral views of the chest. Stable cardiomegaly and mediastinal contours. Visualized tracheal air column is within  normal limits. Stable lung volumes. No pneumothorax, pleural effusion or confluent pulmonary opacity. Calcified aortic atherosclerosis. Acute on chronic pulmonary  interstitial opacity. No pleural fluid identified in the fissures on the lateral. Osteopenia.  Paucity of bowel gas in the upper abdomen. IMPRESSION: 1. Chronic cardiomegaly with acute on chronic pulmonary interstitial opacity. No pleural fluid identified. Consider pulmonary interstitial edema and viral/atypical respiratory infection. 2.  Aortic Atherosclerosis (ICD10-I70.0). Electronically Signed   By: Genevie Ann M.D.   On: 03/02/2019 06:57    Pending Labs Unresulted Labs (From admission, onward)    Start     Ordered   03/02/19 0946  Troponin I - Now Then Q6H  Now then every 6 hours,   STAT     03/02/19 0945   Signed and Held  Basic metabolic panel  Tomorrow morning,   R     Signed and Held   Signed and Held  CBC  Tomorrow morning,   R     Signed and Held   Signed and Held  CBC  (heparin)  Once,   R    Comments:  Baseline for heparin therapy IF NOT ALREADY DRAWN.  Notify MD if PLT < 100 K.    Signed and Held   Signed and Held  Creatinine, serum  (heparin)  Once,   R    Comments:  Baseline for heparin therapy IF NOT ALREADY DRAWN.    Signed and Held          Vitals/Pain Today's Vitals   03/02/19 0600 03/02/19 0700 03/02/19 0730 03/02/19 0800  BP: (!) 154/59 (!) 150/56 (!) 149/56 (!) 153/55  Pulse: 60 62 61 64  Resp: (!) 27 (!) 24 20 17   Temp:      TempSrc:      SpO2: 97% 100% 100% 99%  Weight:      Height:      PainSc: 0-No pain       Isolation Precautions No active isolations  Medications Medications  patiromer (VELTASSA) packet 16.8 g (has no administration in time range)  furosemide (LASIX) injection 20 mg (has no administration in time range)  furosemide (LASIX) injection 40 mg (40 mg Intravenous Given 03/02/19 0737)    Mobility walks with person assist Moderate fall risk   Focused Assessments Cardiac Assessment Handoff:  Cardiac Rhythm: Normal sinus rhythm Lab Results  Component Value Date   CKTOTAL 65 08/24/2013   CKMB 1.4 08/24/2013   TROPONINI 0.31  ([place]) 03/02/2019   No results found for: DDIMER Does the Patient currently have chest pain? No     R Recommendations: See Admitting Provider Note  Report given to:   Additional Notes:

## 2019-03-02 NOTE — ED Triage Notes (Signed)
Pt brought from [facility] for exertional SOB. Pt has increasing SOB for the last two days. Pt was unable to get comfortable this evening and never went to sleep. Pt has pitting edema but stating it is no different than her normal. Pt's saturation was dropping with ambulation per staff at [facility]. Pt wears 3L [place] at home and BG was 241

## 2019-03-02 NOTE — ED Notes (Signed)
Dr Cherylann Banas notified of troponin 0.31- no new orders at this time

## 2019-03-02 NOTE — ED Notes (Signed)
Lab called about troponin- lab stated they would run it

## 2019-03-02 NOTE — TOC Initial Note (Signed)
Transition of Care [facility]) - Initial/Assessment Note    Patient Details  Name: Carolyn Valdez MRN: [id number] Date of Birth: 05-08-1941  Transition of [facility]) CM/SW Contact:    Latanya Maudlin, RN Phone Number: 03/02/2019, 10:56 AM  Clinical Narrative:  TOC consulted to complete high risk re admission assessment. Patient currently lives at [facility] assisted living facility. Patient uses a wheelchairand rolling walker. Patient on chronic O2 @ 3L through [employer]. Facility is able to provide transportation for patient. Patient uses [facility] who are able to see patient at facility. Medications are delivered to patient via the facility.                 Expected Discharge Plan: Assisted Living Barriers to Discharge: Continued Medical Work up   Patient Goals and CMS Choice Patient states their goals for this hospitalization and ongoing recovery are:: to get the nurses to answer when i hit the call bell      Expected Discharge Plan and Services Expected Discharge Plan: Assisted Living   Discharge Planning Services: CM Consult Post Acute Care Choice: Durable Medical Equipment Living arrangements for the past 2 months: [place]                                      Prior Living Arrangements/Services Living arrangements for the past 2 months: [place] Lives with:: Facility Resident Patient language and need for interpreter reviewed:: No Do you feel safe going back to the place where you live?: Yes      Need for Family Participation in Patient Care: No (Comment)   Current home services: DME Criminal Activity/Legal Involvement Pertinent to Current Situation/Hospitalization: No - Comment as needed  Activities of Daily Living      Permission Sought/Granted Permission sought to share information with : Case Manager, Customer service manager                Emotional Assessment Appearance:: Appears stated  age Attitude/Demeanor/Rapport: Engaged, Attention Seeking Affect (typically observed): Accepting Orientation: : Oriented to Self, Oriented to Place, Oriented to  Time, Oriented to Situation      Admission diagnosis:  shortness of breath  Patient Active Problem List   Diagnosis Date Noted  . Acute on chronic diastolic CHF (congestive heart failure) ([place]) 03/02/2019  . Acute renal failure superimposed on chronic kidney disease ([place]) 03/02/2019  . Anemia associated with chronic renal failure 10/25/2018  . Anemia 08/06/2018  . Acute hemorrhagic gastritis   . Stomach irritation   . Angiodysplasia of stomach and duodenum   . CKD (chronic kidney disease), stage III ([place]) 08/04/2018  . Hyperkalemia 07/04/2018  . Atrial fibrillation with RVR ([place]) 04/23/2018  . Syncope 04/22/2018  . Acute respiratory failure with hypoxia and hypercapnia ([place]) 02/21/2017  . Adjustment disorder with anxiety 01/28/2017  . Acute on chronic respiratory failure with hypoxia ([place]) 01/23/2017  . Palliative care encounter   . Goals of care, counseling/discussion   . DNR (do not resuscitate) 10/05/2016  . Palliative care by specialist 10/05/2016  . Depression, major, recurrent, severe with psychosis ([place]) 10/05/2016  . Severe major depression, single episode, with psychotic features ([place]) 10/04/2016  . Cellulitis of leg, right 09/29/2016  . Proctitis 09/29/2016  . Hypercapnia 09/29/2016  . Acute urinary retention 09/29/2016  . UTI (urinary tract infection) 09/29/2016  . Weakness 09/29/2016  . Subacute delirium 09/28/2016  .  Gastrointestinal hemorrhage   . Diffuse abdominal pain   . Altered mental status 09/24/2016  . Pressure injury of skin 09/02/2016  . Respiratory failure with hypoxia ([place]) 09/01/2016  . Lymphedema 08/16/2016  . Acute on chronic congestive heart failure ([place]) 08/09/2016  . Acquired lymphedema of leg 04/21/2016  . Congestive heart failure ([place]) 11/25/2015  . Nocturia 11/05/2015  .  Urinary frequency 11/05/2015  . Acute respiratory failure with hypoxia ([place]) 09/05/2015  . 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 Incontinence 04/20/2015  . Diabetes mellitus, type 2 ([place]) 04/17/2015  . ESBL (extended spectrum beta-lactamase) producing bacteria infection 04/17/2015  . Hypertension 04/17/2015  . Frequent UTI 04/17/2015  . Absence of bladder continence 04/17/2015  . Iron deficiency anemia 03/08/2015  . Symptomatic anemia 09/25/2014  . Abdominal pain, lower 09/25/2014  . Urge incontinence 02/19/2013  . FOM (frequency of micturition) 02/19/2013  . Bladder infection, chronic 08/11/2012  . Difficult or painful urination 07/26/2012  . Lower urinary tract infection 12/31/2011  . Diabetes mellitus ([place]) 12/31/2011   PCP:  Orvis Brill, [facility]:   [place], [place] [place] [address] Phone: [phone] Fax: [phone]  [place], [place] [address] Phone: [phone] Fax: [phone]     Social Determinants of Health (SDOH) Interventions    Readmission Risk Interventions No flowsheet data found.

## 2019-03-02 NOTE — Progress Notes (Signed)
[facility], [place] 03/02/19  Subjective:   LOS: 0 No intake/output data recorded. Results for Carolyn Valdez, Carolyn Valdez (MRN [id number]) as of 03/02/2019 14:44  08/07/2018 06:27 09/28/2018 15:17 03/02/2019 05:30 03/02/2019 05:30  Creatinine 1.98 (H) 2.69 (H) 3.54 (H) 3.48 (H)  Patient known to our practice from previous admissions This time she presents for shortness of breath of 1 day duration Patient is a resident of [facility] nursing facility Patient requires chronic oxygen Serum creatinine trends have been worsening and are summarized above   Objective:  Vital signs in last 24 hours:  Temp:  [97.8 F (36.6 C)-98.4 F (36.9 C)] 97.8 F (36.6 C) (05/15 1212) Pulse Rate:  [58-64] 62 (05/15 1212) Resp:  [16-27] 22 (05/15 1100) BP: (144-163)/(53-67) 154/59 (05/15 1212) SpO2:  [92 %-100 %] 92 % (05/15 1212) Weight:  [120 kg] 120 kg (05/15 0548)  Weight change:  Filed Weights   03/02/19 0548  Weight: 120 kg    Intake/Output:    Intake/Output Summary (Last 24 hours) at 03/02/2019 1444 Last data filed at 03/02/2019 1155 Gross per 24 hour  Intake -  Output 600 ml  Net -600 ml     Physical Exam: General:  Obese, no acute distress, laying in the bed  HEENT  [place] O2, moist oral mucous membranes, anicteric  Neck  supple  Pulm/lungs  normal breathing effort, mild wheezing, no crackles  CVS/Heart  soft systolic murmur, no rub  Abdomen:   Soft, obese, nontender  Extremities:  2+ pitting edema, tenderness over lower legs to palpation  Neurologic:  Alert, oriented  Skin:  No acute rashes          Basic Metabolic Panel:  Recent Labs  Lab 03/02/19 0530  NA 136  136  K 5.1  5.0  CL 100  100  CO2 28  27  GLUCOSE 222*  228*  BUN 67*  69*  CREATININE 3.48*  3.54*  CALCIUM 8.9  8.8*     CBC: Recent Labs  Lab 03/02/19 0530  WBC 9.0  HGB 9.0*  HCT 29.0*  MCV 92.9  PLT 268     No results found for: HEPBSAG, HEPBSAB,  HEPBIGM    Microbiology:  Recent Results (from the past 240 hour(s))  SARS Coronavirus 2 (CEPHEID - Performed in [facility] hospital lab), Hosp Order     Status: None   Collection Time: 03/02/19  6:35 AM  Result Value Ref Range Status   SARS Coronavirus 2 NEGATIVE NEGATIVE Final    Comment: (NOTE) If result is NEGATIVE SARS-CoV-2 target nucleic acids are NOT DETECTED. The SARS-CoV-2 RNA is generally detectable in upper and lower  respiratory specimens during the acute phase of infection. The lowest  concentration of SARS-CoV-2 viral copies this assay can detect is 250  copies / mL. A negative result does not preclude SARS-CoV-2 infection  and should not be used as the sole basis for treatment or other  patient management decisions.  A negative result may occur with  improper specimen collection / handling, submission of specimen other  than nasopharyngeal swab, presence of viral mutation(s) within the  areas targeted by this assay, and inadequate number of viral copies  (<250 copies / mL). A negative result must be combined with clinical  observations, patient history, and epidemiological information. If result is POSITIVE SARS-CoV-2 target nucleic acids are DETECTED. The SARS-CoV-2 RNA is generally detectable in upper and lower  respiratory specimens dur ing the acute phase of infection.  Positive  results are indicative of active infection with SARS-CoV-2.  Clinical  correlation with patient history and other diagnostic information is  necessary to determine patient infection status.  Positive results do  not rule out bacterial infection or co-infection with other viruses. If result is PRESUMPTIVE POSTIVE SARS-CoV-2 nucleic acids MAY BE PRESENT.   A presumptive positive result was obtained on the submitted specimen  and confirmed on repeat testing.  While 2019 novel coronavirus  (SARS-CoV-2) nucleic acids may be present in the submitted sample  additional confirmatory  testing may be necessary for epidemiological  and / or clinical management purposes  to differentiate between  SARS-CoV-2 and other Sarbecovirus currently known to infect humans.  If clinically indicated additional testing with an alternate test  methodology [id number]) is advised. The SARS-CoV-2 RNA is generally  detectable in upper and lower respiratory sp ecimens during the acute  phase of infection. The expected result is Negative. Fact Sheet for Patients:  StrictlyIdeas.no Fact Sheet for Healthcare Providers: BankingDealers.co.za This test is not yet approved or cleared by the [place] FDA and has been authorized for detection and/or diagnosis of SARS-CoV-2 by FDA under an Emergency Use Authorization (EUA).  This EUA will remain in effect (meaning this test can be used) for the duration of the COVID-19 declaration under Section 564(b)(1) of the Act, 21 U.S.C. section 360bbb-3(b)(1), unless the authorization is terminated or revoked sooner. Performed at [facility], [address] [place], [place] [address]     Coagulation Studies: No results for input(s): LABPROT, INR in the last 72 hours.  Urinalysis: No results for input(s): COLORURINE, LABSPEC, PHURINE, GLUCOSEU, HGBUR, BILIRUBINUR, KETONESUR, PROTEINUR, UROBILINOGEN, NITRITE, LEUKOCYTESUR in the last 72 hours.  Invalid input(s): APPERANCEUR    Imaging: Dg Chest 2 View  Result Date: 03/02/2019 CLINICAL DATA:  78 year old female with shortness of breath for 2 days. Former smoker. EXAM: CHEST - 2 VIEW COMPARISON:  Portable chest 08/03/2018 and earlier. FINDINGS: Seated AP and lateral views of the chest. Stable cardiomegaly and mediastinal contours. Visualized tracheal air column is within normal limits. Stable lung volumes. No pneumothorax, pleural effusion or confluent pulmonary opacity. Calcified aortic atherosclerosis. Acute on chronic pulmonary interstitial  opacity. No pleural fluid identified in the fissures on the lateral. Osteopenia.  Paucity of bowel gas in the upper abdomen. IMPRESSION: 1. Chronic cardiomegaly with acute on chronic pulmonary interstitial opacity. No pleural fluid identified. Consider pulmonary interstitial edema and viral/atypical respiratory infection. 2.  Aortic Atherosclerosis (ICD10-I70.0). Electronically Signed   By: Genevie Ann M.D.   On: 03/02/2019 06:57     Medications:    . aspirin  81 mg Oral Daily  . atorvastatin  40 mg Oral q1800  . citalopram  10 mg Oral Daily  . docusate sodium  100 mg Oral BID  . fluticasone  2 spray Each Nare Daily  . furosemide  20 mg Intravenous Q8H  . [START ON 03/03/2019] glipiZIDE  5 mg Oral QAC breakfast  . heparin  5,000 Units Subcutaneous Q8H  . hydrALAZINE  50 mg Oral TID WC  . insulin aspart  0-5 Units Subcutaneous QHS  . insulin aspart  0-9 Units Subcutaneous TID WC  . insulin glargine  20 Units Subcutaneous Daily  . lamoTRIgine  25 mg Oral BID  . levETIRAcetam  250 mg Oral BID  . loratadine  10 mg Oral Daily  . Melatonin  5 mg Oral QHS  . metoprolol tartrate  50 mg Oral BID  . multivitamin with minerals  1  tablet Oral Daily  . nystatin   Topical BID  . OLANZapine  10 mg Oral QHS  . pantoprazole  40 mg Oral BID  . polyethylene glycol  17 g Oral Daily  . senna-docusate  1 tablet Oral QHS  . vitamin B-12  1,000 mcg Oral Daily   acetaminophen, albuterol, clonazePAM, docusate sodium, ondansetron, promethazine  Assessment/ Plan:  77 y.o. Caucasian female with with congestive heart failure , hypertension, diabetes mellitus type II, obstructive sleep apnea, nephrolithiasis   Principal Problem:   Acute on chronic diastolic CHF (congestive heart failure) (HCC) Active Problems:   Acute renal failure superimposed on chronic kidney disease ([place])   #. 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  Last known outpatient creatinine from December 12 is 2.69/GFR 17 Underlying CKD  likely secondary to atherosclerosis, diabetes and hypertension Electrolytes and volume status are acceptable.  No acute indication for dialysis at present Plan to order renal ultrasound Recent Labs    03/02/19 0530  CREATININE 3.48*  3.54*    #. Anemia of CKD  Lab Results  Component Value Date   HGB 9.0 (L) 03/02/2019  Continue to monitor closely Followed by Dr. Grayland Ormond as outpatient  #. BMM  No results found for: PTH Lab Results  Component Value Date   PHOS 4.2 07/06/2018    #. Diabetes type 2 with CKD Hemoglobin A1C (%)  Date Value  09/04/2014 6.2   Hgb A1c MFr Bld (%)  Date Value  04/22/2018 7.6 (H)    #Grade 2 diastolic dysfunction with pulmonary hypertension, tricuspid regurgitation, generalized edema Now with volume overload Currently being treated with IV Lasix 20 mg every 4 hours Urine output recorded at 600 cc  If serum creatinine remains stable, will obtain a 24-hour urine for creatinine clearance for more accurate renal function.  She may be closer to dialysis then it appears We will change intermittent Lasix to IV infusion for more sustained effect   LOS: 0 Nithila Sumners [facility] 5/15/20202:44 PM  [place], [place]

## 2019-03-02 NOTE — H&P (Signed)
[place] [facility] [place] NAME: Carolyn Valdez    MR#:  [id number]  DATE OF BIRTH:  11-19-1940  DATE OF ADMISSION:  03/02/2019  PRIMARY CARE PHYSICIAN: [facility]   REQUESTING/REFERRING PHYSICIAN: Siadecki  CHIEF COMPLAINT:   Chief Complaint  Patient presents with  . Shortness of Breath    HISTORY OF PRESENT ILLNESS: Carolyn Valdez  is a 78 y.o. female with a known history of diastolic congestive heart failure, chronic kidney disease, breast cancer, chest pain, GI bleed, hypertension, hyperlipidemia-lives in [place] community, for last few days she has worsening shortness of breath so came to emergency room for further management.  Patient says she chronically sleeps in a recliner.  She does not have a primary cardiologist in the clinic. This time she denied any associated chest pain or palpitation.  She denied any associated cough fever or sputum production. In ER she was noted to have acute CHF exacerbation and given to hospitalist team.  PAST MEDICAL HISTORY:   Past Medical History:  Diagnosis Date  . (HFpEF) heart failure with preserved ejection fraction ([place])    a. 06/2018 Echo: EF 60-65%, no rwma. Gr2 DD. Mild LVH. Ao sclerosis w/o stenosis. Mild MR. Mild to mod LAE. Nl RV fxn. Mild RAE. Mod TR. PASP 60-68mmHg.  Marland Kitchen Anxiety   . Breast cancer ([place]) 1997   a. 1997 s/p right breast mastectomy  . Chest pain    a. 11/2008 MV: No ischemia. EF 66%.  . CKD (chronic kidney disease) stage 4, GFR 15-29 ml/min (HCC)   . Coronary artery calcification seen on CT scan    a. 01/2017 Chest CT: Atherosclerotic coronary Ca2+.  . Diabetes mellitus without complication ([place])   . DJD (degenerative joint disease)   . Dysuria   . GIB (gastrointestinal bleeding)    a. 07/2018 EGD: Gastritis w/ hemorrhage, duodenal angioectasia. Friable gastric mucosa w/ bleeding on contact. All treated w/ argon plasma coagulation; b. 07/2018 s/p 2u PRBCs.  . H/O total knee  replacement    right  . HTN (hypertension)   . Hypercholesteremia   . Kidney stone   . Obesity   . Recurrent urinary tract infection   . SVT (supraventricular tachycardia) ([place])   . Urinary incontinence   . Vaginal atrophy   . Yeast vaginitis     PAST SURGICAL HISTORY:  Past Surgical History:  Procedure Laterality Date  . CARDIAC CATHETERIZATION    . CHOLECYSTECTOMY    . COLONOSCOPY N/A 08/06/2018   Procedure: COLONOSCOPY;  Surgeon: Virgel Manifold, MD;  Location: [facility] ENDOSCOPY;  Service: Endoscopy;  Laterality: N/A;  . ESOPHAGOGASTRODUODENOSCOPY N/A 08/06/2018   Procedure: ESOPHAGOGASTRODUODENOSCOPY (EGD);  Surgeon: Virgel Manifold, MD;  Location: [facility] ENDOSCOPY;  Service: Endoscopy;  Laterality: N/A;  . FOOT SURGERY    . JOINT REPLACEMENT    . MASTECTOMY Right 1997  . NASAL SEPTUM SURGERY    . PERIPHERAL VASCULAR CATHETERIZATION N/A 07/08/2015   Procedure: PICC Line Insertion;  Surgeon: Algernon Huxley, MD;  Location: [facility];  Service: Cardiovascular;  Laterality: N/A;  . TONSILLECTOMY    . Vocal cords      SOCIAL HISTORY:  Social History   Tobacco Use  . Smoking status: Former Research scientist (life sciences)  . Smokeless tobacco: Former Systems developer    Quit date: 10/29/1995  Substance Use Topics  . Alcohol use: No    Alcohol/week: 0.0 standard drinks    FAMILY HISTORY:  Family History  Problem Relation  Age of Onset  . Lung cancer Father   . Hematuria Mother   . Lung cancer Mother   . Kidney disease Neg Hx   . Bladder Cancer Neg Hx   . Breast cancer Neg Hx     DRUG ALLERGIES:  Allergies  Allergen Reactions  . Sulfa Antibiotics Hives  . Biaxin [Clarithromycin] Hives  . Influenza A (H1n1) Monoval Vac Other (See Comments)    Pt states that she was told by her MD not to get the influenza vaccine.    . Morphine Other (See Comments)    Reaction:  Dizziness and confusion   . Pyridium [Phenazopyridine Hcl] Other (See Comments)    Reaction:  Unknown   . Ceftriaxone  Anxiety  . Latex Rash  . Prednisone Rash  . Tape Rash    REVIEW OF SYSTEMS:   CONSTITUTIONAL: No fever, fatigue or weakness.  EYES: No blurred or double vision.  EARS, NOSE, AND THROAT: No tinnitus or ear pain.  RESPIRATORY: No cough, have shortness of breath, no wheezing or hemoptysis.  CARDIOVASCULAR: No chest pain, orthopnea, edema.  GASTROINTESTINAL: No nausea, vomiting, diarrhea or abdominal pain.  GENITOURINARY: No dysuria, hematuria.  ENDOCRINE: No polyuria, nocturia,  HEMATOLOGY: No anemia, easy bruising or bleeding SKIN: No rash or lesion. MUSCULOSKELETAL: No joint pain or arthritis.   NEUROLOGIC: No tingling, numbness, weakness.  PSYCHIATRY: No anxiety or depression.   MEDICATIONS AT HOME:  Prior to Admission medications   Medication Sig Start Date End Date Taking? Authorizing Provider  acetaminophen (TYLENOL) 325 MG tablet Take 2 tablets (650 mg total) by mouth every 6 (six) hours as needed for mild pain (or Fever >/= 101). 04/26/18  Yes Wieting, Richard, MD  albuterol (PROVENTIL HFA;VENTOLIN HFA) 108 (90 Base) MCG/ACT inhaler Inhale 2 puffs into the lungs every 6 (six) hours as needed for wheezing or shortness of breath. 07/14/18  Yes Gouru, Illene Silver, MD  citalopram (CELEXA) 10 MG tablet Take 1 tablet (10 mg total) by mouth daily. 02/04/17  Yes Wieting, Richard, MD  clonazePAM (KLONOPIN) 0.25 MG disintegrating tablet Take 0.25 mg by mouth every 12 (twelve) hours as needed (anxiety).   Yes [provider]  docusate sodium (COLACE) 100 MG capsule Take 100 mg by mouth 2 (two) times daily.    Yes [provider]  fluticasone (FLONASE) 50 MCG/ACT nasal spray Place 2 sprays into both nostrils daily.    Yes [provider]  glipiZIDE (GLUCOTROL) 5 MG tablet Take 5 mg by mouth daily before breakfast.   Yes [provider]  hydrALAZINE (APRESOLINE) 50 MG tablet Take 1 tablet (50 mg total) by mouth every 8 (eight) hours. Patient taking differently:  Take 50 mg by mouth 3 (three) times daily with meals.  04/26/18  Yes Wieting, Richard, MD  insulin aspart (NOVOLOG) 100 UNIT/ML injection 3 (three) times daily before meals. Inject per sliding scale 0-199=0, 200-500=5 units, notify MD for blood sugar> or equal to 500   Yes [provider]  insulin glargine (LANTUS) 100 UNIT/ML injection Inject 0.1 mLs (10 Units total) into the skin daily. Patient taking differently: Inject 55 Units into the skin at bedtime.  07/15/18  Yes Gouru, Illene Silver, MD  iron polysaccharides (NIFEREX) 150 MG capsule Take 150 mg by mouth daily.   Yes [provider]  lamoTRIgine (LAMICTAL) 25 MG tablet Take 1 tablet (25 mg total) by mouth daily. Patient taking differently: Take 25 mg by mouth 2 (two) times daily.  04/26/18  Yes Loletha Grayer, MD  levETIRAcetam (KEPPRA) 250 MG tablet Take 1 tablet (250 mg total) by mouth 2 (two) times daily. 10/26/16  Yes Epifanio Lesches, MD  loratadine (CLARITIN) 10 MG tablet Take 10 mg by mouth daily.   Yes [provider]  losartan (COZAAR) 50 MG tablet Take 50 mg by mouth daily.   Yes [provider]  Melatonin 3 MG TABS Take 6 mg by mouth at bedtime.   Yes [provider]  metoprolol tartrate (LOPRESSOR) 50 MG tablet Take 50 mg by mouth 2 (two) times daily.   Yes [provider]  mirabegron ER (MYRBETRIQ) 25 MG TB24 tablet Take 25 mg by mouth daily.   Yes [provider]  Multiple Vitamin (MULTIVITAMIN) tablet Take 1 tablet by mouth daily.   Yes [provider]  nystatin (NYSTATIN) powder Apply topically. Apply to under breast and abdominal once daily   Yes [provider]  OLANZapine (ZYPREXA) 10 MG tablet Take 10 mg by mouth at bedtime. 08/01/18  Yes [provider]  ondansetron (ZOFRAN-ODT) 8 MG disintegrating tablet Take 8 mg by mouth every 8 (eight) hours as needed for nausea/vomiting. 08/02/18  Yes [provider]  pantoprazole (PROTONIX)  40 MG tablet Take 40 mg by mouth 2 (two) times daily.   Yes [provider]  polyethylene glycol (MIRALAX / GLYCOLAX) packet Take 17 g by mouth daily. 04/26/18  Yes Wieting, Richard, MD  saccharomyces boulardii (FLORASTOR) 250 MG capsule Take 250 mg by mouth daily.   Yes [provider]  sennosides-docusate sodium (SENOKOT-S) 8.6-50 MG tablet Take 1 tablet by mouth at bedtime.   Yes [provider]  torsemide (DEMADEX) 20 MG tablet Take 2 tablets (40 mg total) by mouth daily. Patient taking differently: Take 20 mg by mouth 2 (two) times daily.  07/15/18  Yes Gouru, Illene Silver, MD  vitamin B-12 (CYANOCOBALAMIN) 1000 MCG tablet Take 1,000 mcg by mouth daily.   Yes [provider]      PHYSICAL EXAMINATION:   VITAL SIGNS: Blood pressure (!) 154/59, pulse 62, temperature 97.8 F (36.6 C), temperature source Oral, resp. rate (!) 22, height 5\' 7"  (1.702 m), weight 120 kg, SpO2 92 %.  GENERAL:  78 y.o.-year-old patient lying in the bed with no acute distress.  EYES: Pupils equal, round, reactive to light and accommodation. No scleral icterus. Extraocular muscles intact.  HEENT: Head atraumatic, normocephalic. Oropharynx and nasopharynx clear.  NECK:  Supple, no jugular venous distention. No thyroid enlargement, no tenderness.  LUNGS: Normal breath sounds bilaterally, no wheezing, bilateral crepitation. No use of accessory muscles of respiration.  CARDIOVASCULAR: S1, S2 normal. No murmurs, rubs, or gallops.  ABDOMEN: Soft, nontender, nondistended. Bowel sounds present. No organomegaly or mass.  EXTREMITIES: Bilateral pedal edema, no cyanosis, or clubbing.  NEUROLOGIC: Cranial nerves II through XII are intact. Muscle strength 5/5 in all extremities. Sensation intact. Gait not checked.  PSYCHIATRIC: The patient is alert and oriented x 3.  SKIN: No obvious rash, lesion, or ulcer.   LABORATORY PANEL:   CBC Recent Labs  Lab 03/02/19 0530  WBC 9.0  HGB 9.0*  HCT  29.0*  PLT 268  MCV 92.9  MCH 28.8  MCHC 31.0  RDW 14.8   ------------------------------------------------------------------------------------------------------------------  Chemistries  Recent Labs  Lab 03/02/19 0530  NA 136  136  K 5.1  5.0  CL 100  100  CO2 28  27  GLUCOSE 222*  228*  BUN 67*  69*  CREATININE 3.48*  3.54*  CALCIUM 8.9  8.8*  AST 12*  ALT 8  ALKPHOS 100  BILITOT 0.4   ------------------------------------------------------------------------------------------------------------------ estimated creatinine clearance is 18.1 mL/min (A) (by C-G formula based on SCr of 3.54 mg/dL (H)). ------------------------------------------------------------------------------------------------------------------ No results for input(s): TSH, T4TOTAL, T3FREE, THYROIDAB in the last 72 hours.  Invalid input(s): FREET3   Coagulation profile No results for input(s): INR, PROTIME in the last 168 hours. ------------------------------------------------------------------------------------------------------------------- No results for input(s): DDIMER in the last 72 hours. -------------------------------------------------------------------------------------------------------------------  Cardiac Enzymes Recent Labs  Lab 03/02/19 0530 03/02/19 1239  TROPONINI 0.31* 0.28*   ------------------------------------------------------------------------------------------------------------------ Invalid input(s): POCBNP  ---------------------------------------------------------------------------------------------------------------  Urinalysis    Component Value Date/Time   COLORURINE YELLOW (A) 08/04/2018 1542   APPEARANCEUR CLEAR (A) 08/04/2018 1542   APPEARANCEUR Cloudy (A) 01/16/2018 1349   LABSPEC 1.009 08/04/2018 1542   LABSPEC 1.008 09/08/2014 1719   PHURINE 5.0 08/04/2018 1542   GLUCOSEU NEGATIVE 08/04/2018 1542   GLUCOSEU Negative 09/08/2014 1719   HGBUR  MODERATE (A) 08/04/2018 1542   BILIRUBINUR NEGATIVE 08/04/2018 1542   BILIRUBINUR Negative 01/16/2018 1349   BILIRUBINUR Negative 09/08/2014 1719   KETONESUR NEGATIVE 08/04/2018 1542   PROTEINUR 100 (A) 08/04/2018 1542   NITRITE NEGATIVE 08/04/2018 1542   LEUKOCYTESUR NEGATIVE 08/04/2018 1542   LEUKOCYTESUR 3+ (A) 01/16/2018 1349   LEUKOCYTESUR 3+ 09/08/2014 1719     RADIOLOGY: Dg Chest 2 View  Result Date: 03/02/2019 CLINICAL DATA:  78 year old female with shortness of breath for 2 days. Former smoker. EXAM: CHEST - 2 VIEW COMPARISON:  Portable chest 08/03/2018 and earlier. FINDINGS: Seated AP and lateral views of the chest. Stable cardiomegaly and mediastinal contours. Visualized tracheal air column is within normal limits. Stable lung volumes. No pneumothorax, pleural effusion or confluent pulmonary opacity. Calcified aortic atherosclerosis. Acute on chronic pulmonary interstitial opacity. No pleural fluid identified in the fissures on the lateral. Osteopenia.  Paucity of bowel gas in the upper abdomen. IMPRESSION: 1. Chronic cardiomegaly with acute on chronic pulmonary interstitial opacity. No pleural fluid identified. Consider pulmonary interstitial edema and viral/atypical respiratory infection. 2.  Aortic Atherosclerosis (ICD10-I70.0). Electronically Signed   By: Genevie Ann M.D.   On: 03/02/2019 06:57    EKG: Orders placed or performed during the hospital encounter of 03/02/19  . ED EKG  . ED EKG    IMPRESSION AND PLAN:  *Acute on chronic diastolic congestive heart failure We will give IV Lasix and monitor closely on renal function. Intake and output measurement, follow daily weight. Echocardiogram and cardiology consult.  *Acute renal failure on CKD stage III Renal function is slightly worse.  We will have to continue to monitor closely with Lasix use and she would need to follow as outpatient with nephrologist.  *Hyperkalemia This could be due to worsening renal function,  Lasix might help and we will give 1 dose of Veltassa.  *Elevated troponin This could be due to stress induced secondary to CHF.  Follow serial troponin and monitor on telemetry.  *Diabetes mellitus  Keep on glipizide and Lantus and sliding scale coverage.  *Hyperlipidemia Continue statin.  *Hypertension Continue hydralazine, metoprolol.  Hold losartan.    All the records are reviewed and case discussed with ED provider. Management plans discussed with the patient, family and they are in agreement.  CODE STATUS: Full code.    Code Status Orders  (From admission, onward)         Start     Ordered   03/02/19 1211  Full code  Continuous     03/02/19 1210  Code Status History    Date Active Date Inactive Code Status Order ID Comments User Context   08/04/2018 0556 08/07/2018 1951 DNR [id number]  Harrie Foreman, MD Inpatient   07/06/2018 1407 07/14/2018 1603 DNR [id number]  Vaughan Basta, MD Inpatient   07/04/2018 2200 07/06/2018 1407 Full Code [id number]  Demetrios Loll, MD Inpatient   04/22/2018 1702 04/26/2018 1851 DNR [id number]  Max Sane, MD Inpatient   02/21/2017 1629 02/24/2017 1747 DNR [id number]  Fritzi Mandes, MD Inpatient   01/31/2017 1617 02/04/2017 1350 DNR [id number]  Max Sane, MD Inpatient   01/23/2017 1320 01/31/2017 1617 Full Code [id number]  Henreitta Leber, MD Inpatient   10/05/2016 1216 10/26/2016 1740 DNR [id number]  Knox Royalty, NP Inpatient   09/24/2016 1731 10/05/2016 1216 Full Code [id number]  Gladstone Lighter, MD Inpatient   09/01/2016 0217 09/05/2016 1628 Full Code [id number]  Hugelmeyer, Milton, DO Inpatient   08/09/2016 2027 08/12/2016 2015 Full Code [id number]  Henreitta Leber, MD Inpatient   09/30/2015 1423 10/07/2015 1737 Full Code [id number]  Loletha Grayer, MD ED   09/05/2015 1756 09/09/2015 1738 DNR [id number]  Loletha Grayer, MD ED    Advance Directive Documentation     Most Recent Value  Type of Advance Directive  Healthcare Power of  Attorney, Living will  Pre-existing out of facility DNR order (yellow form or pink MOST form)  -  "MOST" Form in Place?  -       TOTAL TIME TAKING CARE OF THIS PATIENT: 45 minutes.    Vaughan Basta M.D on 03/02/2019   Between 7am to 6pm - Pager - [phone]  After 6pm go to [email or web address] - password [email or web address] [facility]  Office  [phone]  CC: Primary care physician; [facility]   Note: This dictation was prepared with Dragon dictation along with smaller phrase technology. Any transcriptional errors that result from this process are unintentional.

## 2019-03-02 NOTE — Progress Notes (Signed)
*  PRELIMINARY RESULTS* Echocardiogram 2D Echocardiogram has been performed.  Sherrie Sport 03/02/2019, 2:53 PM

## 2019-03-02 NOTE — ED Notes (Signed)
High falls risk precautions in place

## 2019-03-02 NOTE — ED Provider Notes (Addendum)
[facility] Emergency Department Provider Note ____________________________________________   First MD Initiated Contact with Patient 03/02/19 0719     (approximate)  I have reviewed the triage vital signs and the nursing notes.   HISTORY  Chief Complaint Shortness of Breath    HPI Carolyn Valdez is a 78 y.o. female with PMH as noted below who presents with shortness of breath, gradual onset over the last several days, and worse last night.  It made her unable to get much sleep.  The patient states is worse when she lies flat.  She denies associated cough, fever chills, chest pain, or any vomiting or diarrhea.  She has lower extremity swelling but states it has not changed recently.  Past Medical History:  Diagnosis Date  . Anxiety   . Breast cancer ([place]) 1997   right breast mastectomy  . CHF (congestive heart failure) ([place])   . Diabetes mellitus without complication ([place])   . DJD (degenerative joint disease)   . Dysuria   . H/O total knee replacement    right  . HTN (hypertension)   . Hypercholesteremia   . Kidney stone   . Obesity   . Recurrent urinary tract infection   . SVT (supraventricular tachycardia) ([place])   . Urinary incontinence   . Vaginal atrophy   . Yeast vaginitis     Patient Active Problem List   Diagnosis Date Noted  . Anemia associated with chronic renal failure 10/25/2018  . Anemia 08/06/2018  . Acute hemorrhagic gastritis   . Stomach irritation   . Angiodysplasia of stomach and duodenum   . CKD (chronic kidney disease), stage III ([place]) 08/04/2018  . Hyperkalemia 07/04/2018  . Atrial fibrillation with RVR ([place]) 04/23/2018  . Syncope 04/22/2018  . Acute respiratory failure with hypoxia and hypercapnia ([place]) 02/21/2017  . Adjustment disorder with anxiety 01/28/2017  . Acute on chronic respiratory failure with hypoxia ([place]) 01/23/2017  . Palliative care encounter   . Goals of care, counseling/discussion   . DNR (do not  resuscitate) 10/05/2016  . Palliative care by specialist 10/05/2016  . Depression, major, recurrent, severe with psychosis ([place]) 10/05/2016  . Severe major depression, single episode, with psychotic features ([place]) 10/04/2016  . Cellulitis of leg, right 09/29/2016  . Proctitis 09/29/2016  . Hypercapnia 09/29/2016  . Acute urinary retention 09/29/2016  . UTI (urinary tract infection) 09/29/2016  . Weakness 09/29/2016  . Subacute delirium 09/28/2016  . Gastrointestinal hemorrhage   . Diffuse abdominal pain   . Altered mental status 09/24/2016  . Pressure injury of skin 09/02/2016  . Respiratory failure with hypoxia ([place]) 09/01/2016  . Lymphedema 08/16/2016  . Acute on chronic congestive heart failure ([place]) 08/09/2016  . Acquired lymphedema of leg 04/21/2016  . Congestive heart failure ([place]) 11/25/2015  . Nocturia 11/05/2015  . Urinary frequency 11/05/2015  . Acute respiratory failure with hypoxia ([place]) 09/05/2015  . 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 Incontinence 04/20/2015  . Diabetes mellitus, type 2 ([place]) 04/17/2015  . ESBL (extended spectrum beta-lactamase) producing bacteria infection 04/17/2015  . Hypertension 04/17/2015  . Frequent UTI 04/17/2015  . Absence of bladder continence 04/17/2015  . Iron deficiency anemia 03/08/2015  . Symptomatic anemia 09/25/2014  . Abdominal pain, lower 09/25/2014  . Urge incontinence 02/19/2013  . FOM (frequency of micturition) 02/19/2013  . Bladder infection, chronic 08/11/2012  . Difficult or painful urination 07/26/2012  . Lower urinary tract infection 12/31/2011  . Diabetes mellitus ([place]) 12/31/2011    Past Surgical History:  Procedure Laterality Date  . CARDIAC CATHETERIZATION    . CHOLECYSTECTOMY    . COLONOSCOPY N/A 08/06/2018   Procedure: COLONOSCOPY;  Surgeon: Virgel Manifold, MD;  Location: [facility] ENDOSCOPY;  Service: Endoscopy;  Laterality: N/A;  . ESOPHAGOGASTRODUODENOSCOPY N/A 08/06/2018   Procedure:  ESOPHAGOGASTRODUODENOSCOPY (EGD);  Surgeon: Virgel Manifold, MD;  Location[facility] ENDOSCOPY;  Service: Endoscopy;  Laterality: N/A;  . FOOT SURGERY    . JOINT REPLACEMENT    . MASTECTOMY Right 1997  . NASAL SEPTUM SURGERY    . PERIPHERAL VASCULAR CATHETERIZATION N/A 07/08/2015   Procedure: PICC Line Insertion;  Surgeon: Algernon Huxley, MD;  Location: [place] CV LAB;  Service: Cardiovascular;  Laterality: N/A;  . TONSILLECTOMY    . Vocal cords      Prior to Admission medications   Medication Sig Start Date End Date Taking? Authorizing Provider  acetaminophen (TYLENOL) 325 MG tablet Take 2 tablets (650 mg total) by mouth every 6 (six) hours as needed for mild pain (or Fever >/= 101). 04/26/18  Yes Wieting, Richard, MD  albuterol (PROVENTIL HFA;VENTOLIN HFA) 108 (90 Base) MCG/ACT inhaler Inhale 2 puffs into the lungs every 6 (six) hours as needed for wheezing or shortness of breath. 07/14/18  Yes Gouru, Illene Silver, MD  citalopram (CELEXA) 10 MG tablet Take 1 tablet (10 mg total) by mouth daily. 02/04/17  Yes Wieting, Richard, MD  clonazePAM (KLONOPIN) 0.25 MG disintegrating tablet Take 0.25 mg by mouth every 12 (twelve) hours as needed (anxiety).   Yes [provider]  docusate sodium (COLACE) 100 MG capsule Take 100 mg by mouth 2 (two) times daily.    Yes [provider]  fluticasone (FLONASE) 50 MCG/ACT nasal spray Place 2 sprays into both nostrils daily.    Yes [provider]  glipiZIDE (GLUCOTROL) 5 MG tablet Take 5 mg by mouth daily before breakfast.   Yes [provider]  hydrALAZINE (APRESOLINE) 50 MG tablet Take 1 tablet (50 mg total) by mouth every 8 (eight) hours. Patient taking differently: Take 50 mg by mouth 3 (three) times daily with meals.  04/26/18  Yes Wieting, Richard, MD  insulin aspart (NOVOLOG) 100 UNIT/ML injection 3 (three) times daily before meals. Inject per sliding scale 0-199=0, 200-500=5 units, notify MD for blood sugar> or equal to 500    Yes [provider]  insulin glargine (LANTUS) 100 UNIT/ML injection Inject 0.1 mLs (10 Units total) into the skin daily. Patient taking differently: Inject 55 Units into the skin at bedtime.  07/15/18  Yes Gouru, Illene Silver, MD  iron polysaccharides (NIFEREX) 150 MG capsule Take 150 mg by mouth daily.   Yes [provider]  lamoTRIgine (LAMICTAL) 25 MG tablet Take 1 tablet (25 mg total) by mouth daily. Patient taking differently: Take 25 mg by mouth 2 (two) times daily.  04/26/18  Yes Loletha Grayer, MD  levETIRAcetam (KEPPRA) 250 MG tablet Take 1 tablet (250 mg total) by mouth 2 (two) times daily. 10/26/16  Yes Epifanio Lesches, MD  loratadine (CLARITIN) 10 MG tablet Take 10 mg by mouth daily.   Yes [provider]  losartan (COZAAR) 50 MG tablet Take 50 mg by mouth daily.   Yes [provider]  Melatonin 3 MG TABS Take 6 mg by mouth at bedtime.   Yes [provider]  metoprolol tartrate (LOPRESSOR) 50 MG tablet Take 50 mg by mouth 2 (two) times daily.   Yes [provider]  mirabegron ER (MYRBETRIQ) 25 MG TB24 tablet Take 25  mg by mouth daily.   Yes [provider]  Multiple Vitamin (MULTIVITAMIN) tablet Take 1 tablet by mouth daily.   Yes [provider]  nystatin (NYSTATIN) powder Apply topically. Apply to under breast and abdominal once daily   Yes [provider]  OLANZapine (ZYPREXA) 10 MG tablet Take 10 mg by mouth at bedtime. 08/01/18  Yes [provider]  ondansetron (ZOFRAN-ODT) 8 MG disintegrating tablet Take 8 mg by mouth every 8 (eight) hours as needed for nausea/vomiting. 08/02/18  Yes [provider]  pantoprazole (PROTONIX) 40 MG tablet Take 40 mg by mouth 2 (two) times daily.   Yes [provider]  polyethylene glycol (MIRALAX / GLYCOLAX) packet Take 17 g by mouth daily. 04/26/18  Yes Wieting, Richard, MD  saccharomyces boulardii (FLORASTOR) 250 MG capsule Take 250 mg by  mouth daily.   Yes [provider]  sennosides-docusate sodium (SENOKOT-S) 8.6-50 MG tablet Take 1 tablet by mouth at bedtime.   Yes [provider]  torsemide (DEMADEX) 20 MG tablet Take 2 tablets (40 mg total) by mouth daily. Patient taking differently: Take 20 mg by mouth 2 (two) times daily.  07/15/18  Yes Gouru, Illene Silver, MD  vitamin B-12 (CYANOCOBALAMIN) 1000 MCG tablet Take 1,000 mcg by mouth daily.   Yes [provider]    Allergies Sulfa antibiotics; Biaxin [clarithromycin]; Influenza a (h1n1) monoval vac; Morphine; Pyridium [phenazopyridine hcl]; Ceftriaxone; Latex; Prednisone; and Tape  Family History  Problem Relation Age of Onset  . Lung cancer Father   . Hematuria Mother   . Lung cancer Mother   . Kidney disease Neg Hx   . Bladder Cancer Neg Hx   . Breast cancer Neg Hx     Social History Social History   Tobacco Use  . Smoking status: Former Research scientist (life sciences)  . Smokeless tobacco: Former Systems developer    Quit date: 10/29/1995  Substance Use Topics  . Alcohol use: No    Alcohol/week: 0.0 standard drinks  . Drug use: No    Review of Systems  Constitutional: No fever/chills.  Eyes: No redness. ENT: No sore throat. Cardiovascular: Denies chest pain. Respiratory: Positive for shortness of breath. Gastrointestinal: No vomiting or diarrhea.  Genitourinary: Negative for dysuria.  Musculoskeletal: Negative for back pain. Skin: Negative for rash. Neurological: Negative for headache.   ____________________________________________   PHYSICAL EXAM:  VITAL SIGNS: ED Triage Vitals  Enc Vitals Group     BP 03/02/19 0549 (!) 158/65     Pulse Rate 03/02/19 0547 64     Resp 03/02/19 0547 17     Temp 03/02/19 0553 98.4 F (36.9 C)     Temp Source 03/02/19 0553 Oral     SpO2 03/02/19 0543 96 %     Weight 03/02/19 0548 264 lb 9.6 oz (120 kg)     Height 03/02/19 0548 5\' 7"  (1.702 m)     Head Circumference --      Peak Flow --      Pain Score 03/02/19 0548 0      Pain Loc --      Pain Edu? --      Excl. in [place]? --     Constitutional: Alert and oriented.  Relatively well appearing and in no acute distress. Eyes: Conjunctivae are normal.  Head: Atraumatic. Nose: No congestion/rhinnorhea. Mouth/Throat: Mucous membranes are moist.   Neck: Normal range of motion.  Cardiovascular: Normal rate, regular rhythm. Grossly normal heart sounds.  Good peripheral circulation. Respiratory: Normal respiratory effort.  No retractions.  Diminished breath sounds bilaterally with faint rales. Gastrointestinal: No distention.  Musculoskeletal: 1+ bilateral lower extremity edema.  Extremities warm and well perfused.  Neurologic:  Normal speech and language. No gross focal neurologic deficits are appreciated.  Skin:  Skin is warm and dry. No rash noted. Psychiatric: Mood and affect are normal. Speech and behavior are normal.  ____________________________________________   LABS (all labs ordered are listed, but only abnormal results are displayed)  Labs Reviewed  BASIC METABOLIC PANEL - Abnormal; Notable for the following components:      Result Value   Glucose, Bld 228 (*)    BUN 69 (*)    Creatinine, Ser 3.54 (*)    Calcium 8.8 (*)    GFR calc non Af Amer 12 (*)    GFR calc Af Amer 14 (*)    All other components within normal limits  CBC - Abnormal; Notable for the following components:   RBC 3.12 (*)    Hemoglobin 9.0 (*)    HCT 29.0 (*)    All other components within normal limits  COMPREHENSIVE METABOLIC PANEL - Abnormal; Notable for the following components:   Glucose, Bld 222 (*)    BUN 67 (*)    Creatinine, Ser 3.48 (*)    AST 12 (*)    GFR calc non Af Amer 12 (*)    GFR calc Af Amer 14 (*)    All other components within normal limits  TROPONIN I - Abnormal; Notable for the following components:   Troponin I 0.31 (*)    All other components within normal limits  BRAIN NATRIURETIC PEPTIDE - Abnormal; Notable for the following components:    B Natriuretic Peptide 867.0 (*)    All other components within normal limits  SARS CORONAVIRUS 2 (HOSPITAL ORDER, [place])   ____________________________________________  EKG  ED ECG REPORT I, Arta Silence, the attending physician, personally viewed and interpreted this ECG.  Date: 03/02/2019 EKG Time: 0547 Rate: 65 Rhythm: normal sinus rhythm QRS Axis: normal Intervals: normal ST/T Wave abnormalities: Minimal ST flattening diffusely Narrative Interpretation: no evidence of acute ischemia; no significant change when compared to EKG of 08/03/2018  ____________________________________________  RADIOLOGY  CXR: Acute on chronic pulmonary interstitial opacities  ____________________________________________   PROCEDURES  Procedure(s) performed: No  Procedures  Critical Care performed: *No ____________________________________________   INITIAL IMPRESSION / ASSESSMENT AND PLAN / ED COURSE  Pertinent labs & imaging results that were available during my care of the patient were reviewed by me and considered in my medical decision making (see chart for details).  78 year old female with PMH as noted above including a history of CHF presents with gradual onset shortness of breath over the last several days, worsening last night.  Staff at her facility noted that her O2 saturation was dropping with ambulation.  I reviewed the past medical records in [place].  The patient was most recently admitted last October with anemia, respiratory failure due to pulmonary edema, and kidney injury.  On exam today, the patient is relatively well-appearing and has no significant increased work of breathing or respiratory distress.  O2 saturation is 100% on the patient's normal 3 L of O2 by nasal cannula.  Her other vital signs are normal.  The remainder of the exam is as described above.  EKG shows no ischemic changes.  Overall I suspect most likely mild CHF  exacerbation.  Differential also includes acute bronchitis or less likely pneumonia although I have  a low suspicion for this given the lack of fever, cough, or other infectious symptoms.  We will obtain chest x-ray, lab work-up, and reassess.  ----------------------------------------- 9:11 AM on 03/02/2019 -----------------------------------------  The patient continues to appear comfortable and is asymptomatic.  Troponin is elevated, so the patient will need admission for further monitoring and trending of cardiac enzymes.  BNP is also elevated consistent with CHF.  The remainder of the patient's labs are consistent with her baseline.  I signed the patient out to the hospitalist Dr. Leslye Peer.  ______________________________  Carolyn Valdez was evaluated in Emergency Department on 03/02/2019 for the symptoms described in the history of present illness. She was evaluated in the context of the global COVID-19 pandemic, which necessitated consideration that the patient might be at risk for infection with the SARS-CoV-2 virus that causes COVID-19. Institutional protocols and algorithms that pertain to the evaluation of patients at risk for COVID-19 are in a state of rapid change based on information released by regulatory bodies including the CDC and federal and state organizations. These policies and algorithms were followed during the patient's care in the ED. ____________________________________________   FINAL CLINICAL IMPRESSION(S) / ED DIAGNOSES  Final diagnoses:  Acute on chronic congestive heart failure, unspecified heart failure type ([place])  Elevated troponin      NEW MEDICATIONS STARTED DURING THIS VISIT:  New Prescriptions   No medications on file     Note:  This document was prepared using Dragon voice recognition software and may include unintentional dictation errors.    Arta Silence, MD 03/02/19 [id number]    Arta Silence, MD 03/02/19 [phone]

## 2019-03-02 NOTE — Consult Note (Signed)
Cardiology Consult    Patient ID: Carolyn Valdez MRN: [id number], DOB/AGE: 1940/12/12   Admit date: 03/02/2019 Date of Consult: 03/02/2019  Primary Physician: Orvis Brill, [facility] Primary Cardiologist: Kathlyn Sacramento, MD Requesting Provider: Doylene Canning  Patient Profile    Carolyn Valdez is a 78 y.o. female with a history of HFpEF, PAH, recurrent UTI, GIB, HTN, HL, CKD IV-V, and obesity, who is being seen today for the evaluation of CHF at the request of Dr. Anselm Jungling.  Past Medical History   Past Medical History:  Diagnosis Date  . (HFpEF) heart failure with preserved ejection fraction ([place])    a. 06/2018 Echo: EF 60-65%, no rwma. Gr2 DD. Mild LVH. Ao sclerosis w/o stenosis. Mild MR. Mild to mod LAE. Nl RV fxn. Mild RAE. Mod TR. PASP 60-27mmHg.  Marland Kitchen Anxiety   . Breast cancer ([place]) 1997   a. 1997 s/p right breast mastectomy  . Chest pain    a. 11/2008 MV: No ischemia. EF 66%.  . CKD (chronic kidney disease) stage 4, GFR 15-29 ml/min ([facility])   . Coronary artery calcification seen on CT scan    a. 01/2017 Chest CT: Atherosclerotic coronary Ca2+.  . Diabetes mellitus without complication ([place])   . DJD (degenerative joint disease)   . Dysuria   . GIB (gastrointestinal bleeding)    a. 07/2018 EGD: Gastritis w/ hemorrhage, duodenal angioectasia. Friable gastric mucosa w/ bleeding on contact. All treated w/ argon plasma coagulation; b. 07/2018 s/p 2u PRBCs.  . H/O total knee replacement    right  . HTN (hypertension)   . Hypercholesteremia   . Kidney stone   . Obesity   . Recurrent urinary tract infection   . SVT (supraventricular tachycardia) ([place])   . Urinary incontinence   . Vaginal atrophy   . Yeast vaginitis     Past Surgical History:  Procedure Laterality Date  . CARDIAC CATHETERIZATION    . CHOLECYSTECTOMY    . COLONOSCOPY N/A 08/06/2018   Procedure: COLONOSCOPY;  Surgeon: Virgel Manifold, MD;  Location: [facility] ENDOSCOPY;  Service: Endoscopy;  Laterality: N/A;  .  ESOPHAGOGASTRODUODENOSCOPY N/A 08/06/2018   Procedure: ESOPHAGOGASTRODUODENOSCOPY (EGD);  Surgeon: Virgel Manifold, MD;  Location: [facility] ENDOSCOPY;  Service: Endoscopy;  Laterality: N/A;  . FOOT SURGERY    . JOINT REPLACEMENT    . MASTECTOMY Right 1997  . NASAL SEPTUM SURGERY    . PERIPHERAL VASCULAR CATHETERIZATION N/A 07/08/2015   Procedure: PICC Line Insertion;  Surgeon: Algernon Huxley, MD;  Location: [place] CV LAB;  Service: Cardiovascular;  Laterality: N/A;  . TONSILLECTOMY    . Vocal cords       Allergies  Allergies  Allergen Reactions  . Sulfa Antibiotics Hives  . Biaxin [Clarithromycin] Hives  . Influenza A (H1n1) [place] Vac Other (See Comments)    Pt states that she was told by her MD not to get the influenza vaccine.    . Morphine Other (See Comments)    Reaction:  Dizziness and confusion   . Pyridium [Phenazopyridine Hcl] Other (See Comments)    Reaction:  Unknown   . Ceftriaxone Anxiety  . Latex Rash  . Prednisone Rash  . Tape Rash    History of Present Illness    78 year old female with above complex past medical history including HFpEF, pulmonary hypertension, recurrent UTI, hypertension, hyperlipidemia, CKD IV-V and obesity.  She has a history of chest pain that dates back to 2010, at which time she underwent stress testing which was  nonischemic.  In September 2019, she was admitted to [facility] in the setting of urinary tract infection, dehydration, and acute on chronic renal failure.  She was hyperkalemic as well and in that setting bradycardic.  She was seen by cardiology during that admission however bradycardia resolved with resolution of hyperkalemia.  An echocardiogram was performed and showed an EF of 60 to 65% with grade 2 diastolic dysfunction and pulmonary hypertension with a PASP of 60 to 65 mmHg.  Moderate tricuspid regurgitation was also present.  She was readmitted in late October 2019, with weakness, hypoxia/respiratory failure, edema,  nausea, and vomiting.  She was found to be anemic and required 2 units of packed red blood cells.  She also required diuresis.  EGD showed gastritis with hemorrhage, duodenal angioectasia, and friable mucosa with bleeding.  All areas were treated with argon plasma coagulation.  She was subsequently placed on PPI therapy.  She has been followed by hematology for persistent anemia and chronic weakness and fatigue and was placed on Procrit earlier this year, but says that she missed her last appointment for an injection.  She now lives in an assisted living facility. She wears O2 24h/day.   In the setting of Covid-19, she has been confined to her room and meals are brought to her.  She weighs a few times a week and she has noted an increase in weight, which she attributed to an adjustment in her insulin regimen.  Activity is very limited - 'bed to the bathroom and back.'  She was in her usoh until 2 days ago, when she began to note increasing dyspnea and orthopnea.  She denies chest pain but notes that several days ago, she had a pain under her left upper arm that lasted a few hrs and eventually resolved w/ tylenol.  She has not noted any change in chronic mild lower ext edema.  She had trouble sleeping last night due to dyspnea and notifed ALF staff this AM. EMS was called and was reportedly hypoxic, thus she was transferred to the ED for eval.  Here, SpO2 was 96% on 3lpm and she was hemodynamically stable (mildly hypertensive - 150's).  BNP elevated @ 867 w/ trop of 0.31  0.28.  Creat 3.54, which is above previous recording of 2.69 in 09/2018. CXR w/  chronic cardiomegaly and acute on chronic pulmonary interstitial opacity ? edema vs viral/atypical respiratory infection.  Covid-19 testing negative.  She was given lasix 40 IV x 1 in the ED and admitted to IM.  Currently resting comfortably and in no acute distress.  Inpatient Medications    . citalopram  10 mg Oral Daily  . docusate sodium  100 mg Oral BID  .  fluticasone  2 spray Each Nare Daily  . furosemide  20 mg Intravenous Q8H  . [START ON 03/03/2019] glipiZIDE  5 mg Oral QAC breakfast  . heparin  5,000 Units Subcutaneous Q8H  . hydrALAZINE  50 mg Oral TID WC  . insulin aspart  0-5 Units Subcutaneous QHS  . insulin aspart  0-9 Units Subcutaneous TID WC  . insulin glargine  20 Units Subcutaneous Daily  . lamoTRIgine  25 mg Oral BID  . levETIRAcetam  250 mg Oral BID  . loratadine  10 mg Oral Daily  . Melatonin  5 mg Oral QHS  . metoprolol tartrate  50 mg Oral BID  . multivitamin with minerals  1 tablet Oral Daily  . nystatin   Topical BID  . OLANZapine  10 mg Oral QHS  . pantoprazole  40 mg Oral BID  . polyethylene glycol  17 g Oral Daily  . senna-docusate  1 tablet Oral QHS  . vitamin B-12  1,000 mcg Oral Daily    Family History    Family History  Problem Relation Age of Onset  . Lung cancer Father   . Hematuria Mother   . Lung cancer Mother   . Kidney disease Neg Hx   . Bladder Cancer Neg Hx   . Breast cancer Neg Hx    She indicated that her mother is deceased. She indicated that her father is deceased. She indicated that the status of her neg hx is unknown.   Social History    Social History   Socioeconomic History  . Marital status: Widowed    Spouse name: Not on file  . Number of children: Not on file  . Years of education: Not on file  . Highest education level: Not on file  Occupational History  . Not on file  Social Needs  . Financial resource strain: Not on file  . Food insecurity:    Worry: Not on file    Inability: Not on file  . Transportation needs:    Medical: Not on file    Non-medical: Not on file  Tobacco Use  . Smoking status: Former Research scientist (life sciences)  . Smokeless tobacco: Former Systems developer    Quit date: 10/29/1995  Substance and Sexual Activity  . Alcohol use: No    Alcohol/week: 0.0 standard drinks  . Drug use: No  . Sexual activity: Never  Lifestyle  . Physical activity:    Days per week: Not on file     Minutes per session: Not on file  . Stress: Not on file  Relationships  . Social connections:    Talks on phone: Not on file    Gets together: Not on file    Attends religious service: Not on file    Active member of club or organization: Not on file    Attends meetings of clubs or organizations: Not on file    Relationship status: Not on file  . Intimate partner violence:    Fear of current or ex partner: Not on file    Emotionally abused: Not on file    Physically abused: Not on file    Forced sexual activity: Not on file  Other Topics Concern  . Not on file  Social History Narrative  . Not on file     Review of Systems    General:  No chills, fever, night sweats or weight changes.  Cardiovascular:  No chest pain, +++ left arm pain a few days ago, +++ dyspnea on exertion, +++ chronic edema, +++ orthopnea, no palpitations, paroxysmal nocturnal dyspnea. Dermatological: No rash, lesions/masses Respiratory: No cough, +++ dyspnea Urologic: No hematuria, dysuria Abdominal:   No nausea, vomiting, diarrhea, bright red blood per rectum, melena, or hematemesis Neurologic:  No visual changes, wkns, changes in mental status. All other systems reviewed and are otherwise negative except as noted above.  Physical Exam    Blood pressure (!) 154/59, pulse 62, temperature 97.8 F (36.6 C), temperature source Oral, resp. rate (!) 22, height 5\' 7"  (1.702 m), weight 120 kg, SpO2 92 %.  General: Pleasant, NAD. Very pale. Psych: Normal affect. Neuro: Alert and oriented X 3. Moves all extremities spontaneously. HEENT: Normal  Neck: Supple without bruits.  JVP to jaw. Lungs:  Resp regular and unlabored, crackles ~ 1/3 way  up bilat. Heart: RRR no s3, s4, 2/6 syst murmur heard throughout. Abdomen: Obese, soft, non-tender, non-distended, BS + x 4.  Extremities: No clubbing, cyanosis.  Lower ext are tender with trace bilat ankle edema. DP/PT/Radials 1+ and equal bilaterally.  Labs      Recent Labs    03/02/19 0530 03/02/19 1239  TROPONINI 0.31* 0.28*   Lab Results  Component Value Date   WBC 9.0 03/02/2019   HGB 9.0 (L) 03/02/2019   HCT 29.0 (L) 03/02/2019   MCV 92.9 03/02/2019   PLT 268 03/02/2019    Recent Labs  Lab 03/02/19 0530  NA 136  136  K 5.1  5.0  CL 100  100  CO2 28  27  BUN 67*  69*  CREATININE 3.48*  3.54*  CALCIUM 8.9  8.8*  PROT 7.2  BILITOT 0.4  ALKPHOS 100  ALT 8  AST 12*  GLUCOSE 222*  228*   Lab Results  Component Value Date   CHOL 159 09/06/2015   HDL 32 (L) 09/06/2015   LDLCALC 97 09/06/2015   TRIG 152 (H) 09/06/2015     Radiology Studies    Dg Chest 2 View  Result Date: 03/02/2019 CLINICAL DATA:  78 year old female with shortness of breath for 2 days. Former smoker. EXAM: CHEST - 2 VIEW COMPARISON:  Portable chest 08/03/2018 and earlier. FINDINGS: Seated AP and lateral views of the chest. Stable cardiomegaly and mediastinal contours. Visualized tracheal air column is within normal limits. Stable lung volumes. No pneumothorax, pleural effusion or confluent pulmonary opacity. Calcified aortic atherosclerosis. Acute on chronic pulmonary interstitial opacity. No pleural fluid identified in the fissures on the lateral. Osteopenia.  Paucity of bowel gas in the upper abdomen. IMPRESSION: 1. Chronic cardiomegaly with acute on chronic pulmonary interstitial opacity. No pleural fluid identified. Consider pulmonary interstitial edema and viral/atypical respiratory infection. 2.  Aortic Atherosclerosis (ICD10-I70.0). Electronically Signed   By: Genevie Ann M.D.   On: 03/02/2019 06:57    ECG & Cardiac Imaging    RSR, 65, diffuse, mild ST dep - personally reviewed. Changes are similar to 04/2018 ECG.  Assessment & Plan    1.  Acute on chronic diastolic congestive heart failure/pulmonary arterial hypertension: Echocardiogram in September 2019 showed normal LV function with grade 2 diastolic dysfunction.  Pulmonary arterial  hypertension was present with a PASP of 60 to 65 mmHg, along with moderate tricuspid regurgitation.  She was admitted with a 2 to 3-day history of progressive dyspnea and found to have interstitial edema on chest x-ray.  COVID testing is negative.  Creatinine elevated above baseline at 3.54 this morning.  She was 2.69 in December 2019.  She received lasix 40mg  IV x 1 in the ED and is now on 20mg  BID.  She is volume overloaded on exam w/ JVD and crackles.  Wt is up to 120kg, from 112.9 in January.  Cont diuresis - may require higher dose given CKD.  Nephrology consult pending.  Cont HR/BP mgmt w/  blocker and hydralazine.  Hold home ARB.  2.  Elevated troponin: Trop 0.31 on ED arrival w/ f/u of 0.28.  She had an episode of left arm pain that was relieved with tylenol the other day.  No chest pain.  Activity is limited @ home.  Suspect demand ischemia in the setting of CHF/volume overload.  She had a neg MV in 2010, and has coronary Ca2+ by CT in 2018.  F/u echo.  She is not a suitable candidate for invasive  eval/cath in the setting of acute on chronic renal failure.  If echo shows nl EF w/o wma, would plan conservative rx.  Cont  blocker.  Add low dose asa and statin.  With down-trending troponin and h/o anemia/GIB will not add rx heparin @ this time.  3.  Acute on chronic stage IV-V kidney disease: Creatinine 3.54 this morning.  She was 2.69 in December. Nephrology to see.  4.  Normocytic anemia: Stable.  Missed her last procrit injection.  Will need to resume outpt f/u w/ heme.  5.  Essential hypertension: Blood pressure elevated in the setting of volume overload.  Follow with diuresis.  Hold ARB in the setting of renal failure.  Continue beta-blocker and hydralazine.  6.  Type 2 diabetes mellitus: Insulin management per internal medicine.  A1c was 7.6 in July 2019.  Add statin.  Hold ARB.  7.  Chronic respiratory failure: On home O2, nebulizer/inhaler therapy as outpt.  Signed, Murray Hodgkins,  NP 03/02/2019, 1:16 PM  For questions or updates, please contact   Please consult [email or web address] for contact info under Cardiology/STEMI.

## 2019-03-02 NOTE — ED Notes (Signed)
Pt given a phone to use

## 2019-03-02 NOTE — ED Notes (Signed)
Pt repositioned in bed.

## 2019-03-03 ENCOUNTER — Inpatient Hospital Stay: Payer: Medicare Other

## 2019-03-03 DIAGNOSIS — N183 Chronic kidney disease, stage 3 (moderate): Secondary | ICD-10-CM

## 2019-03-03 DIAGNOSIS — N179 Acute kidney failure, unspecified: Principal | ICD-10-CM

## 2019-03-03 LAB — CBC
HCT: 25.7 % — ABNORMAL LOW (ref 36.0–46.0)
Hemoglobin: 7.9 g/dL — ABNORMAL LOW (ref 12.0–15.0)
MCH: 28.8 pg (ref 26.0–34.0)
MCHC: 30.7 g/dL (ref 30.0–36.0)
MCV: 93.8 fL (ref 80.0–100.0)
Platelets: 223 10*3/uL (ref 150–400)
RBC: 2.74 MIL/uL — ABNORMAL LOW (ref 3.87–5.11)
RDW: 14.6 % (ref 11.5–15.5)
WBC: 6.6 10*3/uL (ref 4.0–10.5)
nRBC: 0 % (ref 0.0–0.2)

## 2019-03-03 LAB — GLUCOSE, CAPILLARY
Glucose-Capillary: 76 mg/dL (ref 70–99)
Glucose-Capillary: 86 mg/dL (ref 70–99)
Glucose-Capillary: 120 mg/dL — ABNORMAL HIGH (ref 70–99)
Glucose-Capillary: 144 mg/dL — ABNORMAL HIGH (ref 70–99)
Glucose-Capillary: 173 mg/dL — ABNORMAL HIGH (ref 70–99)

## 2019-03-03 LAB — RENAL FUNCTION PANEL
Albumin: 3.1 g/dL — ABNORMAL LOW (ref 3.5–5.0)
Anion gap: 7 (ref 5–15)
BUN: 69 mg/dL — ABNORMAL HIGH (ref 8–23)
CO2: 30 mmol/L (ref 22–32)
Calcium: 9.1 mg/dL (ref 8.9–10.3)
Chloride: 103 mmol/L (ref 98–111)
Creatinine, Ser: 3.44 mg/dL — ABNORMAL HIGH (ref 0.44–1.00)
GFR calc Af Amer: 14 mL/min — ABNORMAL LOW (ref >60)
GFR calc non Af Amer: 12 mL/min — ABNORMAL LOW (ref >60)
Glucose, Bld: 96 mg/dL (ref 70–99)
Phosphorus: 6.1 mg/dL — ABNORMAL HIGH (ref 2.5–4.6)
Potassium: 5 mmol/L (ref 3.5–5.1)
Sodium: 140 mmol/L (ref 135–145)

## 2019-03-03 MED ORDER — HYDRALAZINE HCL 25 MG PO TABS
25.0000 mg | ORAL_TABLET | Freq: Three times a day (TID) | ORAL | Status: DC
  Administered 2019-03-03 – 2019-03-19 (×40): 25 mg via ORAL
  Filled 2019-03-03 (×40): qty 1

## 2019-03-03 MED ORDER — METOPROLOL TARTRATE 25 MG PO TABS
25.0000 mg | ORAL_TABLET | Freq: Two times a day (BID) | ORAL | Status: DC
  Administered 2019-03-04 – 2019-03-18 (×25): 25 mg via ORAL
  Filled 2019-03-03 (×27): qty 1

## 2019-03-03 MED ORDER — INSULIN GLARGINE 100 UNIT/ML ~~LOC~~ SOLN
10.0000 [IU] | Freq: Every day | SUBCUTANEOUS | Status: DC
  Filled 2019-03-03: qty 0.1

## 2019-03-03 MED ORDER — INSULIN GLARGINE 100 UNIT/ML ~~LOC~~ SOLN
10.0000 [IU] | Freq: Every day | SUBCUTANEOUS | Status: DC
  Administered 2019-03-03 – 2019-03-05 (×3): 10 [IU] via SUBCUTANEOUS
  Filled 2019-03-03 (×4): qty 0.1

## 2019-03-03 NOTE — Progress Notes (Signed)
Talked to Dr. Tressia Miners about patient's HR at 57 has scheduled metoprolol 25 mg p.o, order to hold and parameter given. RN will continue to monitor.

## 2019-03-03 NOTE — Progress Notes (Signed)
Progress Note  Patient Name: Carolyn Valdez Date of Encounter: 03/03/2019  Primary Cardiologist: Fletcher Anon  Subjective   Documented UOP of 1.3 L for the past 24 hours and net - 1.3 L for the admission. Renal function is improving to 3.44 , though remains above her baseline with a range around ~ 2.3. Weight at admission of 120 kg (prior 112 kg in 10/2018) with a current weight of 118 kg. HGB low at 7.9 (missed her last Procrit).   SOB possibly a little better. No chest pain. Lower extremities about the same.   Inpatient Medications    Scheduled Meds: . aspirin  81 mg Oral Daily  . atorvastatin  40 mg Oral q1800  . citalopram  10 mg Oral Daily  . docusate sodium  100 mg Oral BID  . feeding supplement (ENSURE ENLIVE)  237 mL Oral BID BM  . fluticasone  2 spray Each Nare Daily  . heparin  5,000 Units Subcutaneous Q8H  . hydrALAZINE  50 mg Oral TID WC  . insulin aspart  0-5 Units Subcutaneous QHS  . insulin aspart  0-9 Units Subcutaneous TID WC  . insulin glargine  20 Units Subcutaneous Daily  . lamoTRIgine  25 mg Oral BID  . levETIRAcetam  250 mg Oral BID  . loratadine  10 mg Oral Daily  . Melatonin  5 mg Oral QHS  . metoprolol tartrate  50 mg Oral BID  . multivitamin with minerals  1 tablet Oral Daily  . nystatin   Topical BID  . OLANZapine  10 mg Oral QHS  . pantoprazole  40 mg Oral BID  . polyethylene glycol  17 g Oral Daily  . senna-docusate  1 tablet Oral QHS  . vitamin B-12  1,000 mcg Oral Daily   Continuous Infusions: . furosemide (LASIX) infusion 8 mg/hr (03/02/19 1726)   PRN Meds: acetaminophen, albuterol, clonazePAM, docusate sodium, ondansetron, promethazine   Vital Signs    Vitals:   03/03/19 0500 03/03/19 0601 03/03/19 0742 03/03/19 1000  BP:  (!) 124/46 (!) 122/48 (!) 126/46  Pulse:  (!) 53 (!) 55   Resp:  17 16   Temp:  (!) 97.1 F (36.2 C) (!) 97.5 F (36.4 C)   TempSrc:  Axillary Oral   SpO2:  97% 94%   Weight: 118 kg     Height:         Intake/Output Summary (Last 24 hours) at 03/03/2019 1038 Last data filed at 03/03/2019 0458 Gross per 24 hour  Intake 92.19 ml  Output 1300 ml  Net -1207.81 ml   Filed Weights   03/02/19 0548 03/03/19 0500  Weight: 120 kg 118 kg    Telemetry    Sinus bradycardia, upper 40s to 50s bpm - Personally Reviewed  ECG    n/a - Personally Reviewed  Physical Exam   GEN: No acute distress. Pale appearing.    Neck: JVD elevated to the angle of the mandible. Cardiac: Bradycardic, II/VI systolic murmur at the base, no rubs, or gallops.  Respiratory: Crackles bilaterally.  GI: Soft, nontender, non-distended.   MS: Trace bilateral lower extremity edema; No deformity. Neuro:  Alert and oriented x 3; Nonfocal.  Psych: Normal affect.  Labs    Chemistry Recent Labs  Lab 03/02/19 0530 03/03/19 0523  NA 136  136 140  K 5.1  5.0 5.0  CL 100  100 103  CO2 28  27 30   GLUCOSE 222*  228* 96  BUN 67*  69*  69*  CREATININE 3.48*  3.54* 3.44*  CALCIUM 8.9  8.8* 9.1  PROT 7.2  --   ALBUMIN 3.5 3.1*  AST 12*  --   ALT 8  --   ALKPHOS 100  --   BILITOT 0.4  --   GFRNONAA 12*  12* 12*  GFRAA 14*  14* 14*  ANIONGAP 8  9 7      Hematology Recent Labs  Lab 03/02/19 0530 03/03/19 0523  WBC 9.0 6.6  RBC 3.12* 2.74*  HGB 9.0* 7.9*  HCT 29.0* 25.7*  MCV 92.9 93.8  MCH 28.8 28.8  MCHC 31.0 30.7  RDW 14.8 14.6  PLT 268 223    Cardiac Enzymes Recent Labs  Lab 03/02/19 0530 03/02/19 1239 03/02/19 1544 03/02/19 2211  TROPONINI 0.31* 0.28* 0.26* 0.31*   No results for input(s): TROPIPOC in the last 168 hours.   BNP Recent Labs  Lab 03/02/19 0530  BNP 867.0*     DDimer No results for input(s): DDIMER in the last 168 hours.   Radiology    Dg Chest 2 View  Result Date: 03/03/2019 IMPRESSION: Cardiomegaly.  No acute abnormalities. Electronically Signed   By: Dorise Bullion III M.D   On: 03/03/2019 08:42   Dg Chest 2 View  Result Date:  03/02/2019 IMPRESSION: 1. Chronic cardiomegaly with acute on chronic pulmonary interstitial opacity. No pleural fluid identified. Consider pulmonary interstitial edema and viral/atypical respiratory infection. 2.  Aortic Atherosclerosis (ICD10-I70.0). Electronically Signed   By: Genevie Ann M.D.   On: 03/02/2019 06:57   US Renal  Result Date: 03/02/2019 IMPRESSION: Negative renal ultrasound. Electronically Signed   By: Franchot Gallo M.D.   On: 03/02/2019 16:50    Cardiac Studies   2D Echo 03/02/2019: 1. The left ventricle has low normal systolic function, with an ejection fraction of 50-55%. The cavity size was mildly dilated. There is mild concentric left ventricular hypertrophy. Left ventricular diastolic Doppler parameters are consistent with pseudonormalization.  2. The right ventricle has normal systolic function. The cavity was normal. There is no increase in right ventricular wall thickness. Right ventricular systolic pressure is severely elevated with an estimated pressure of 72.7 mmHg.  3. Left atrial size was mildly dilated.  4. Right atrial size was mildly dilated.  5. The mitral valve is grossly normal. There is moderate mitral annular calcification present. Mild mitral valve stenosis.  6. The tricuspid valve is grossly normal. Tricuspid valve regurgitation is moderate.  7. The aortic valve is grossly normal. Aortic valve regurgitation was not assessed by color flow Doppler.  Patient Profile     78 y.o. female with history of HFpEF, PAH, recurrent UTI, GIB, HTN, HLD, CKD stage IV-V, and obesity who is being seen today for the evaluation of CHF.  Assessment & Plan    1. Acute on chronic HFpEF/PAH: -She remains volume overloaded with a current weight of 118 kg which is up from 112.9 kg in 10/2018 -PA pressure of 72 mmHg this admission with a prior of 60-65 mmHg in 06/2018 -Continue Lasix gtt, she will need several more days if IV diuresis  -Strict I/O and daily standing weights -CHF  education -Consider advance heart failure clinic for PAH in follow up  2. Elevated troponin: -Mildly elevated troponin felt to be supply demand ischemia in the setting of volume overload with CKD stage IV-V and anemia -Not a candidate for invasive evaluation/cath in the setting of acute on CKD and anemia -Echo this admission showed preserved LVSF -No plans  for inpatient ischemic evaluation at this time -No heparin gtt given down trending troponin, lack of angina, and anemia with prior GIB -ASA, Lipitor   3. Acute on CKD stage IV-V: -Renal function improving with Lasix gtt -Monitor closely -PTA ARB held -Nephrology on board  4. Anemia of chronic disease: -Relatively stable -She missed her last Procrit injection -Possibly exacerbating some of her dyspnea -Transfuse as needed -Outpatient follow up  5. Chronic respiratory failure with hypoxia: -On home O2  For questions or updates, please contact [place] Please consult [email or web address] for contact info under Cardiology/STEMI.    Signed, Christell Faith, PA-C [facility] Pager: [phone] 03/03/2019, 10:38 AM

## 2019-03-03 NOTE — Progress Notes (Signed)
[place] at [facility]                                                                                                                                                                                  Patient Demographics   Carolyn Valdez, is a 78 y.o. female, DOB - [birth year], RWE:[id number]  Admit date - 03/02/2019   Admitting Physician Vaughan Basta, MD  Outpatient Primary MD for the patient is [facility]   LOS - 1  Subjective: Patient's doing better shortness of breath with some improved   Review of Systems:   CONSTITUTIONAL: No documented fever. No fatigue, weakness. No weight gain, no weight loss.  EYES: No blurry or double vision.  ENT: No tinnitus. No postnasal drip. No redness of the oropharynx.  RESPIRATORY: No cough, no wheeze, no hemoptysis. No dyspnea.  CARDIOVASCULAR: No chest pain. No orthopnea. No palpitations. No syncope.  GASTROINTESTINAL: No nausea, no vomiting or diarrhea. No abdominal pain. No melena or hematochezia.  GENITOURINARY: No dysuria or hematuria.  ENDOCRINE: No polyuria or nocturia. No heat or cold intolerance.  HEMATOLOGY: No anemia. No bruising. No bleeding.  INTEGUMENTARY: No rashes. No lesions.  MUSCULOSKELETAL: No arthritis. No swelling. No gout.  NEUROLOGIC: No numbness, tingling, or ataxia. No seizure-type activity.  PSYCHIATRIC: No anxiety. No insomnia. No ADD.    Vitals:   Vitals:   03/03/19 0601 03/03/19 0742 03/03/19 1000 03/03/19 1309  BP: (!) 124/46 (!) 122/48 (!) 126/46 (!) 116/44  Pulse: (!) 53 (!) 55  (!) 55  Resp: 17 16    Temp: (!) 97.1 F (36.2 C) (!) 97.5 F (36.4 C)  98.2 F (36.8 C)  TempSrc: Axillary Oral  Oral  SpO2: 97% 94%  96%  Weight:      Height:        Wt Readings from Last 3 Encounters:  03/03/19 118 kg  10/23/18 112.9 kg  09/28/18 113.2 kg     Intake/Output Summary (Last 24 hours) at 03/03/2019 1355 Last data filed at 03/03/2019 1040 Gross per 24 hour   Intake 449.19 ml  Output 1350 ml  Net -900.81 ml    Physical Exam:   GENERAL: Pleasant-appearing in no apparent distress.  HEAD, EYES, EARS, NOSE AND THROAT: Atraumatic, normocephalic. Extraocular muscles are intact. Pupils equal and reactive to light. Sclerae anicteric. No conjunctival injection. No oro-pharyngeal erythema.  NECK: Supple. There is no jugular venous distention. No bruits, no lymphadenopathy, no thyromegaly.  HEART: Regular rate and rhythm,. No murmurs, no rubs, no clicks.  LUNGS: Bilateral crackles throughout both lung.  ABDOMEN: Soft, flat, nontender, nondistended. Has good bowel sounds. No hepatosplenomegaly appreciated.  EXTREMITIES: No  evidence of any cyanosis, clubbing, or peripheral edema.  +2 pedal and radial pulses bilaterally.  NEUROLOGIC: The patient is alert, awake, and oriented x3 with no focal motor or sensory deficits appreciated bilaterally.  SKIN: Moist and warm with no rashes appreciated.  Psych: Not anxious, depressed LN: No inguinal LN enlargement    Antibiotics   Anti-infectives (From admission, onward)   None      Medications   Scheduled Meds: . aspirin  81 mg Oral Daily  . atorvastatin  40 mg Oral q1800  . citalopram  10 mg Oral Daily  . docusate sodium  100 mg Oral BID  . feeding supplement (ENSURE ENLIVE)  237 mL Oral BID BM  . fluticasone  2 spray Each Nare Daily  . heparin  5,000 Units Subcutaneous Q8H  . hydrALAZINE  50 mg Oral TID WC  . insulin aspart  0-5 Units Subcutaneous QHS  . insulin aspart  0-9 Units Subcutaneous TID WC  . insulin glargine  20 Units Subcutaneous Daily  . lamoTRIgine  25 mg Oral BID  . levETIRAcetam  250 mg Oral BID  . loratadine  10 mg Oral Daily  . Melatonin  5 mg Oral QHS  . metoprolol tartrate  50 mg Oral BID  . multivitamin with minerals  1 tablet Oral Daily  . nystatin   Topical BID  . OLANZapine  10 mg Oral QHS  . pantoprazole  40 mg Oral BID  . polyethylene glycol  17 g Oral Daily  .  senna-docusate  1 tablet Oral QHS  . vitamin B-12  1,000 mcg Oral Daily   Continuous Infusions: . furosemide (LASIX) infusion 10 mg/hr (03/03/19 1316)   PRN Meds:.acetaminophen, albuterol, clonazePAM, docusate sodium, ondansetron, promethazine   Data Review:   Micro Results Recent Results (from the past 240 hour(s))  SARS Coronavirus 2 (CEPHEID - Performed in [place] hospital lab), Hosp Order     Status: None   Collection Time: 03/02/19  6:35 AM  Result Value Ref Range Status   SARS Coronavirus 2 NEGATIVE NEGATIVE Final    Comment: (NOTE) If result is NEGATIVE SARS-CoV-2 target nucleic acids are NOT DETECTED. The SARS-CoV-2 RNA is generally detectable in upper and lower  respiratory specimens during the acute phase of infection. The lowest  concentration of SARS-CoV-2 viral copies this assay can detect is 250  copies / mL. A negative result does not preclude SARS-CoV-2 infection  and should not be used as the sole basis for treatment or other  patient management decisions.  A negative result may occur with  improper specimen collection / handling, submission of specimen other  than nasopharyngeal swab, presence of viral mutation(s) within the  areas targeted by this assay, and inadequate number of viral copies  (<250 copies / mL). A negative result must be combined with clinical  observations, patient history, and epidemiological information. If result is POSITIVE SARS-CoV-2 target nucleic acids are DETECTED. The SARS-CoV-2 RNA is generally detectable in upper and lower  respiratory specimens dur ing the acute phase of infection.  Positive  results are indicative of active infection with SARS-CoV-2.  Clinical  correlation with patient history and other diagnostic information is  necessary to determine patient infection status.  Positive results do  not rule out bacterial infection or co-infection with other viruses. If result is PRESUMPTIVE POSTIVE SARS-CoV-2 nucleic  acids MAY BE PRESENT.   A presumptive positive result was obtained on the submitted specimen  and confirmed on repeat testing.  While 2019  novel coronavirus  (SARS-CoV-2) nucleic acids may be present in the submitted sample  additional confirmatory testing may be necessary for epidemiological  and / or clinical management purposes  to differentiate between  SARS-CoV-2 and other Sarbecovirus currently known to infect humans.  If clinically indicated additional testing with an alternate test  methodology [phone]) is advised. The SARS-CoV-2 RNA is generally  detectable in upper and lower respiratory sp ecimens during the acute  phase of infection. The expected result is Negative. Fact Sheet for Patients:  StrictlyIdeas.no Fact Sheet for Healthcare Providers: BankingDealers.co.za This test is not yet approved or cleared by the [place] FDA and has been authorized for detection and/or diagnosis of SARS-CoV-2 by FDA under an Emergency Use Authorization (EUA).  This EUA will remain in effect (meaning this test can be used) for the duration of the COVID-19 declaration under Section 564(b)(1) of the Act, 21 U.S.C. section 360bbb-3(b)(1), unless the authorization is terminated or revoked sooner. Performed at [facility], [address], [place],  [address]     Radiology Reports Dg Chest 2 View  Result Date: 03/03/2019 CLINICAL DATA:  78 year old female with a known history of diastolic congestive heart failure. EXAM: CHEST - 2 VIEW COMPARISON:  Mar 02, 2019 FINDINGS: Stable cardiomegaly. The hila and mediastinum are normal. No pulmonary nodules, masses, focal infiltrates, or overt edema. IMPRESSION: Cardiomegaly.  No acute abnormalities. Electronically Signed   By: Dorise Bullion III M.D   On: 03/03/2019 08:42   Dg Chest 2 View  Result Date: 03/02/2019 CLINICAL DATA:  78 year old female with shortness of breath for 2  days. Former smoker. EXAM: CHEST - 2 VIEW COMPARISON:  Portable chest 08/03/2018 and earlier. FINDINGS: Seated AP and lateral views of the chest. Stable cardiomegaly and mediastinal contours. Visualized tracheal air column is within normal limits. Stable lung volumes. No pneumothorax, pleural effusion or confluent pulmonary opacity. Calcified aortic atherosclerosis. Acute on chronic pulmonary interstitial opacity. No pleural fluid identified in the fissures on the lateral. Osteopenia.  Paucity of bowel gas in the upper abdomen. IMPRESSION: 1. Chronic cardiomegaly with acute on chronic pulmonary interstitial opacity. No pleural fluid identified. Consider pulmonary interstitial edema and viral/atypical respiratory infection. 2.  Aortic Atherosclerosis (ICD10-I70.0). Electronically Signed   By: Genevie Ann M.D.   On: 03/02/2019 06:57   US Renal  Result Date: 03/02/2019 CLINICAL DATA:  Acute renal failure EXAM: RENAL / URINARY TRACT ULTRASOUND COMPLETE COMPARISON:  Renal ultrasound 07/05/2018 FINDINGS: Right Kidney: Renal measurements: 10.8 x 6.6 x 6.5 cm = volume: 243 mL . Echogenicity within normal limits. No mass or hydronephrosis visualized. Left Kidney: Renal measurements: 10.8 x 6.7 x 6.7 cm = volume: 254 mL. Echogenicity within normal limits. No mass or hydronephrosis visualized. Bladder: Appears normal for degree of bladder distention. IMPRESSION: Negative renal ultrasound. Electronically Signed   By: Franchot Gallo M.D.   On: 03/02/2019 16:50     CBC Recent Labs  Lab 03/02/19 0530 03/03/19 0523  WBC 9.0 6.6  HGB 9.0* 7.9*  HCT 29.0* 25.7*  PLT 268 223  MCV 92.9 93.8  MCH 28.8 28.8  MCHC 31.0 30.7  RDW 14.8 14.6    Chemistries  Recent Labs  Lab 03/02/19 0530 03/03/19 0523  NA 136  136 140  K 5.1  5.0 5.0  CL 100  100 103  CO2 28  27 30   GLUCOSE 222*  228* 96  BUN 67*  69* 69*  CREATININE 3.48*  3.54* 3.44*  CALCIUM 8.9  8.8*  9.1  AST 12*  --   ALT 8  --   ALKPHOS 100  --    BILITOT 0.4  --    ------------------------------------------------------------------------------------------------------------------ estimated creatinine clearance is 18.5 mL/min (A) (by C-G formula based on SCr of 3.44 mg/dL (H)). ------------------------------------------------------------------------------------------------------------------ No results for input(s): HGBA1C in the last 72 hours. ------------------------------------------------------------------------------------------------------------------ No results for input(s): CHOL, HDL, LDLCALC, TRIG, CHOLHDL, LDLDIRECT in the last 72 hours. ------------------------------------------------------------------------------------------------------------------ No results for input(s): TSH, T4TOTAL, T3FREE, THYROIDAB in the last 72 hours.  Invalid input(s): FREET3 ------------------------------------------------------------------------------------------------------------------ No results for input(s): VITAMINB12, FOLATE, FERRITIN, TIBC, IRON, RETICCTPCT in the last 72 hours.  Coagulation profile No results for input(s): INR, PROTIME in the last 168 hours.  No results for input(s): DDIMER in the last 72 hours.  Cardiac Enzymes Recent Labs  Lab 03/02/19 1239 03/02/19 1544 03/02/19 2211  TROPONINI 0.28* 0.26* 0.31*   ------------------------------------------------------------------------------------------------------------------ Invalid input(s): POCBNP    Assessment & Plan   *Acute on chronic diastolic congestive heart failure Continue IV Lasix Appreciate cardiology input  *Acute renal failure on CKD stage III Monitor renal function follow, if worsens will ask nephrology to see  *Hyperkalemia Stable   *Elevated troponin Due to demand ischemia  *Diabetes mellitus  Keep on glipizide and Lantus and sliding scale coverage.  *Hyperlipidemia Continue statin.  *Hypertension Blood pressure borderline will  adjust antihypertensives     Code Status Orders  (From admission, onward)         Start     Ordered   03/02/19 1211  Full code  Continuous     03/02/19 1210        Code Status History    Date Active Date Inactive Code Status Order ID Comments User Context   08/04/2018 0556 08/07/2018 1951 DNR [id number]  Harrie Foreman, MD Inpatient   07/06/2018 1407 07/14/2018 1603 DNR [id number]  Vaughan Basta, MD Inpatient   07/04/2018 2200 07/06/2018 1407 Full Code [id number]  Demetrios Loll, MD Inpatient   04/22/2018 1702 04/26/2018 1851 DNR [id number]  Max Sane, MD Inpatient   02/21/2017 1629 02/24/2017 1747 DNR [id number]  Fritzi Mandes, MD Inpatient   01/31/2017 1617 02/04/2017 1350 DNR [id number]  Max Sane, MD Inpatient   01/23/2017 1320 01/31/2017 1617 Full Code [id number]  Henreitta Leber, MD Inpatient   10/05/2016 1216 10/26/2016 1740 DNR [id number]  Knox Royalty, NP Inpatient   09/24/2016 1731 10/05/2016 1216 Full Code [id number]  Gladstone Lighter, MD Inpatient   09/01/2016 0217 09/05/2016 1628 Full Code [id number]  Hugelmeyer, [place], DO Inpatient   08/09/2016 2027 08/12/2016 2015 Full Code [id number]  Henreitta Leber, MD Inpatient   09/30/2015 1423 10/07/2015 1737 Full Code [id number]  Loletha Grayer, MD ED   09/05/2015 1756 09/09/2015 1738 DNR [id number]  Loletha Grayer, MD ED    Advance Directive Documentation     Most Recent Value  Type of Advance Directive  Healthcare Power of [place], Living will  Pre-existing out of facility DNR order (yellow form or pink MOST form)  -  "MOST" Form in Place?  -           Consults cardiology  DVT Prophylaxis Heparin  Lab Results  Component Value Date   PLT 223 03/03/2019     Time Spent in minutes   35 minutes greater than 50% of time spent in care coordination and counseling patient regarding the condition and plan of care.   Dustin Flock M.D on 03/03/2019 at 1:55 PM  Between 7am to 6pm - Pager -  [phone]  After 6pm go to  [email or web address] - Proofreader  [employer]   Office  [phone]

## 2019-03-03 NOTE — Progress Notes (Signed)
[facility], [place] 03/03/19  Subjective:   LOS: 1 05/15 0701 - 05/16 0700 In: 92.2 [I.V.:92.2] Out: 1400 [Urine:1400] Patient is a resident of [place] facility Patient requires chronic oxygen.  Denies any acute shortness of breath Overall doing fair.  Appetite remains poor because she does not like the food Good response to IV Lasix infusion.  Urine output 1400 cc Serum creatinine about the same at 3.4   Objective:  Vital signs in last 24 hours:  Temp:  [97.1 F (36.2 C)-97.9 F (36.6 C)] 97.5 F (36.4 C) (05/16 0742) Pulse Rate:  [53-100] 55 (05/16 0742) Resp:  [16-22] 16 (05/16 0742) BP: (110-157)/(41-109) 126/46 (05/16 1000) SpO2:  [92 %-100 %] 94 % (05/16 0742) Weight:  [160 kg] 118 kg (05/16 0500)  Weight change: -2.022 kg Filed Weights   03/02/19 0548 03/03/19 0500  Weight: 120 kg 118 kg    Intake/Output:    Intake/Output Summary (Last 24 hours) at 03/03/2019 1022 Last data filed at 03/03/2019 0458 Gross per 24 hour  Intake 92.19 ml  Output 1300 ml  Net -1207.81 ml     Physical Exam: General:  Obese, no acute distress, laying in the bed  HEENT  [place] O2, moist oral mucous membranes, anicteric  Neck  supple  Pulm/lungs  normal breathing effort, [place] O2  CVS/Heart  soft systolic murmur, no rub  Abdomen:   Soft, obese, nontender  Extremities:  2+ pitting edema, tenderness over lower legs to palpation  Neurologic: Sleepy but arousable.   Skin:  No acute rashes   Foley catheter in place       Basic Metabolic Panel:  Recent Labs  Lab 03/02/19 0530 03/03/19 0523  NA 136  136 140  K 5.1  5.0 5.0  CL 100  100 103  CO2 28  27 30   GLUCOSE 222*  228* 96  BUN 67*  69* 69*  CREATININE 3.48*  3.54* 3.44*  CALCIUM 8.9  8.8* 9.1  PHOS  --  6.1*     CBC: Recent Labs  Lab 03/02/19 0530 03/03/19 0523  WBC 9.0 6.6  HGB 9.0* 7.9*  HCT 29.0* 25.7*  MCV 92.9 93.8  PLT 268 223     No results found for:  HEPBSAG, HEPBSAB, HEPBIGM    Microbiology:  Recent Results (from the past 240 hour(s))  SARS Coronavirus 2 (CEPHEID - Performed in [place] hospital lab), Hosp Order     Status: None   Collection Time: 03/02/19  6:35 AM  Result Value Ref Range Status   SARS Coronavirus 2 NEGATIVE NEGATIVE Final    Comment: (NOTE) If result is NEGATIVE SARS-CoV-2 target nucleic acids are NOT DETECTED. The SARS-CoV-2 RNA is generally detectable in upper and lower  respiratory specimens during the acute phase of infection. The lowest  concentration of SARS-CoV-2 viral copies this assay can detect is 250  copies / mL. A negative result does not preclude SARS-CoV-2 infection  and should not be used as the sole basis for treatment or other  patient management decisions.  A negative result may occur with  improper specimen collection / handling, submission of specimen other  than nasopharyngeal swab, presence of viral mutation(s) within the  areas targeted by this assay, and inadequate number of viral copies  (<250 copies / mL). A negative result must be combined with clinical  observations, patient history, and epidemiological information. If result is POSITIVE SARS-CoV-2 target nucleic acids are DETECTED. The SARS-CoV-2 RNA is generally detectable  in upper and lower  respiratory specimens dur ing the acute phase of infection.  Positive  results are indicative of active infection with SARS-CoV-2.  Clinical  correlation with patient history and other diagnostic information is  necessary to determine patient infection status.  Positive results do  not rule out bacterial infection or co-infection with other viruses. If result is PRESUMPTIVE POSTIVE SARS-CoV-2 nucleic acids MAY BE PRESENT.   A presumptive positive result was obtained on the submitted specimen  and confirmed on repeat testing.  While 2019 novel coronavirus  (SARS-CoV-2) nucleic acids may be present in the submitted sample  additional  confirmatory testing may be necessary for epidemiological  and / or clinical management purposes  to differentiate between  SARS-CoV-2 and other Sarbecovirus currently known to infect humans.  If clinically indicated additional testing with an alternate test  methodology [phone]) is advised. The SARS-CoV-2 RNA is generally  detectable in upper and lower respiratory sp ecimens during the acute  phase of infection. The expected result is Negative. Fact Sheet for Patients:  StrictlyIdeas.no Fact Sheet for Healthcare Providers: BankingDealers.co.za This test is not yet approved or cleared by the [place] FDA and has been authorized for detection and/or diagnosis of SARS-CoV-2 by FDA under an Emergency Use Authorization (EUA).  This EUA will remain in effect (meaning this test can be used) for the duration of the COVID-19 declaration under Section 564(b)(1) of the Act, 21 U.S.C. section 360bbb-3(b)(1), unless the authorization is terminated or revoked sooner. Performed at [facility], [address] [place],  [address]     Coagulation Studies: No results for input(s): LABPROT, INR in the last 72 hours.  Urinalysis: No results for input(s): COLORURINE, LABSPEC, PHURINE, GLUCOSEU, HGBUR, BILIRUBINUR, KETONESUR, PROTEINUR, UROBILINOGEN, NITRITE, LEUKOCYTESUR in the last 72 hours.  Invalid input(s): APPERANCEUR    Imaging: Dg Chest 2 View  Result Date: 03/03/2019 CLINICAL DATA:  78 year old female with a known history of diastolic congestive heart failure. EXAM: CHEST - 2 VIEW COMPARISON:  Mar 02, 2019 FINDINGS: Stable cardiomegaly. The hila and mediastinum are normal. No pulmonary nodules, masses, focal infiltrates, or overt edema. IMPRESSION: Cardiomegaly.  No acute abnormalities. Electronically Signed   By: Dorise Bullion III M.D   On: 03/03/2019 08:42   Dg Chest 2 View  Result Date: 03/02/2019 CLINICAL DATA:   78 year old female with shortness of breath for 2 days. Former smoker. EXAM: CHEST - 2 VIEW COMPARISON:  Portable chest 08/03/2018 and earlier. FINDINGS: Seated AP and lateral views of the chest. Stable cardiomegaly and mediastinal contours. Visualized tracheal air column is within normal limits. Stable lung volumes. No pneumothorax, pleural effusion or confluent pulmonary opacity. Calcified aortic atherosclerosis. Acute on chronic pulmonary interstitial opacity. No pleural fluid identified in the fissures on the lateral. Osteopenia.  Paucity of bowel gas in the upper abdomen. IMPRESSION: 1. Chronic cardiomegaly with acute on chronic pulmonary interstitial opacity. No pleural fluid identified. Consider pulmonary interstitial edema and viral/atypical respiratory infection. 2.  Aortic Atherosclerosis (ICD10-I70.0). Electronically Signed   By: Genevie Ann M.D.   On: 03/02/2019 06:57   US Renal  Result Date: 03/02/2019 CLINICAL DATA:  Acute renal failure EXAM: RENAL / URINARY TRACT ULTRASOUND COMPLETE COMPARISON:  Renal ultrasound 07/05/2018 FINDINGS: Right Kidney: Renal measurements: 10.8 x 6.6 x 6.5 cm = volume: 243 mL . Echogenicity within normal limits. No mass or hydronephrosis visualized. Left Kidney: Renal measurements: 10.8 x 6.7 x 6.7 cm = volume: 254 mL. Echogenicity within normal limits. No mass or  hydronephrosis visualized. Bladder: Appears normal for degree of bladder distention. IMPRESSION: Negative renal ultrasound. Electronically Signed   By: Franchot Gallo M.D.   On: 03/02/2019 16:50     Medications:   . furosemide (LASIX) infusion 8 mg/hr (03/02/19 1726)   . aspirin  81 mg Oral Daily  . atorvastatin  40 mg Oral q1800  . citalopram  10 mg Oral Daily  . docusate sodium  100 mg Oral BID  . feeding supplement (ENSURE ENLIVE)  237 mL Oral BID BM  . fluticasone  2 spray Each Nare Daily  . heparin  5,000 Units Subcutaneous Q8H  . hydrALAZINE  50 mg Oral TID WC  . insulin aspart  0-5 Units  Subcutaneous QHS  . insulin aspart  0-9 Units Subcutaneous TID WC  . insulin glargine  20 Units Subcutaneous Daily  . lamoTRIgine  25 mg Oral BID  . levETIRAcetam  250 mg Oral BID  . loratadine  10 mg Oral Daily  . Melatonin  5 mg Oral QHS  . metoprolol tartrate  50 mg Oral BID  . multivitamin with minerals  1 tablet Oral Daily  . nystatin   Topical BID  . OLANZapine  10 mg Oral QHS  . pantoprazole  40 mg Oral BID  . polyethylene glycol  17 g Oral Daily  . senna-docusate  1 tablet Oral QHS  . vitamin B-12  1,000 mcg Oral Daily   acetaminophen, albuterol, clonazePAM, docusate sodium, ondansetron, promethazine  Assessment/ Plan:  78 y.o. Caucasian female with with congestive heart failure , hypertension, diabetes mellitus type II, obstructive sleep apnea, nephrolithiasis   Principal Problem:   Acute on chronic diastolic CHF (congestive heart failure) (HCC) Active Problems:   Acute renal failure superimposed on chronic kidney disease ([place])   #.  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   Last known outpatient creatinine from December 12 is 2.69/GFR 17 Underlying CKD likely secondary to atherosclerosis, diabetes and hypertension Electrolytes and volume status are acceptable.  No acute indication for dialysis at present Renal ultrasound negative for obstruction Obtain 24-hour urine for creatinine clearance  Recent Labs    03/02/19 0530 03/03/19 0523  CREATININE 3.48*  3.54* 3.44*    #. Anemia of CKD  Lab Results  Component Value Date   HGB 7.9 (L) 03/03/2019  Continue to monitor closely Followed by Dr. Grayland Ormond as outpatient  #.  Hyperphosphatemia  No results found for: PTH Lab Results  Component Value Date   PHOS 6.1 (H) 03/03/2019  Likely due to CKD Monitor closely  #. Diabetes type 2 with CKD Hemoglobin A1C (%)  Date Value  09/04/2014 6.2   Hgb A1c MFr Bld (%)  Date Value  04/22/2018 7.6 (H)    #Grade 2 diastolic dysfunction with pulmonary  hypertension, tricuspid regurgitation, generalized edema Continue IV furosemide infusion for volume overload urine output recorded at 1400 cc  obtain a 24-hour urine for creatinine clearance for more accurate renal function.   She may be closer to needing chronic dialysis Discussed with nurse to Ambulate/sit in chair    LOS: [place] 5/16/202010:22 [place], [place]

## 2019-03-03 NOTE — Progress Notes (Signed)
Physical Therapy Evaluation Patient Details Name: ANZLEIGH SLAVEN MRN: [id number] DOB: 01/19/1941 Today's Date: 03/03/2019   History of Present Illness  Amory Zbikowski  is a 78 y.o. female with a known history of diastolic congestive heart failure, chronic kidney disease, breast cancer, chest pain, GI bleed, hypertension, hyperlipidemia-lives in [place], for last few days she has worsening shortness of breath so came to emergency room for further management. Patient says she chronically sleeps in a recliner.  She does not have a primary cardiologist in the clinic. This time she denied any associated chest pain or palpitation.  She denied any associated cough fever or sputum production. In ER she was noted to have acute CHF exacerbation and given to hospitalist team. She is currently admitted for acute on chronic CHF, elevated troponin, and AOCKD IV-V.   Clinical Impression  Pt admitted with above diagnosis. Pt currently with functional limitations due to the deficits listed below (see PT Problem List). Pt is modified independent moving from supine to sitting with HOB elevated and use of bed rails. When returning to bed she requires assist from therapist for BLEs to return to bed. Pt demonstrates safe hand placement. Slow speed but no safety concerns noted. Pt is able to ambulate with therapist in room to door and back to bed. She dons 3L/min O2 via nasal canula the entire time. SaO2 is 93% on exertion. Pt demonstrates good safety with use of rolling walker. Gait speed is decreased but functional for limited household mobility. Spoke with sister via telephone who reports that while pt has good strength and functional ability she lacks motivation and even asks staff to wipe her in the bathroom despite being able to perform this herself. Sister is requesting OT consult to encourage pt to perform appropriate self-care. Pt is very flat throughout the entire visit and appears to lack motivation to participate  with therapy. Pt has history of MDD and takes multiple psychoactive medications. Unclear from records which psychiatrist follows patient. She is safe to return back to her ALF and would benefit from [facility] PT if she would be willing to participate. Pt will benefit from PT services to address deficits in strength, balance, and mobility in order to return to full function at home.     Follow Up Recommendations Home health PT;Other (comment)(Return to ALF with [facility] PT)    Equipment Recommendations  None recommended by PT    Recommendations for Other Services OT consult;Other (comment)(Psychiatry consult?)     Precautions / Restrictions Precautions Precautions: Fall Restrictions Weight Bearing Restrictions: No      Mobility  Bed Mobility Overal bed mobility: Needs Assistance Bed Mobility: Supine to Sit;Sit to Supine     Supine to sit: Modified independent (Device/Increase time) Sit to supine: Min assist   General bed mobility comments: Pt is modified independent moving from supine to sitting with HOB elevated and use of bed rails. When returning to bed she requires assist from therapist for [place] to return to bed  Transfers Overall transfer level: Needs assistance Equipment used: Rolling walker (2 wheeled) Transfers: Sit to/from Stand Sit to Stand: Min guard         General transfer comment: Pt demonstrates safe hand placement. Slow speed but no safety concerns noted  Ambulation/Gait Ambulation/Gait assistance: Min guard Gait Distance (Feet): 20 Feet Assistive device: Rolling walker (2 wheeled)       General Gait Details: Pt is able to ambulate with therapist in room to door and back to bed. She  dons 3L/min O2 via nasal canula the entire time. SaO2 is 93% on exertion. Pt demonstrates good safety with use of rolling walker. Gait speed is decreased but functional for limited household mobility  Stairs            Wheelchair Mobility    Modified Rankin (Stroke Patients  Only)       Balance Overall balance assessment: Needs assistance Sitting-balance support: No upper extremity supported Sitting balance-Leahy Scale: Fair     Standing balance support: Bilateral upper extremity supported Standing balance-Leahy Scale: Fair                               Pertinent Vitals/Pain Pain Assessment: No/denies pain(Pt reporting intermittent back pain)    Home Living Family/patient expects to be discharged to:: Assisted living               Home Equipment: Walker - 2 wheels;Wheelchair - Brewing technologist - built in Additional Comments: Pt is a resident at [facility]    Prior Function Level of Independence: Needs assistance   Gait / Transfers Assistance Needed: Pt ambulating short distances with RW.  No falls in the past 6 months.   ADL's / Homemaking Assistance Needed: Pt requires assist from caregivers for sponge bathing, dressing.  Care attendant does the cooking, cleaning, driving.         Hand Dominance   Dominant Hand: Right    Extremity/Trunk Assessment   Upper Extremity Assessment Upper Extremity Assessment: Generalized weakness(Deconditioning)    Lower Extremity Assessment Lower Extremity Assessment: Generalized weakness(Deconditioning)       Communication   Communication: No difficulties  Cognition Arousal/Alertness: Lethargic Behavior During Therapy: Flat affect Overall Cognitive Status: Within Functional Limits for tasks assessed                                 General Comments: Pt is very flat and at times answers "I don't know," however she is able to answer all AOx4 questions      General Comments      Exercises     Assessment/Plan    PT Assessment Patient needs continued PT services  PT Problem List Decreased strength;Decreased activity tolerance;Decreased balance;Decreased mobility       PT Treatment Interventions DME instruction;Gait training;Functional mobility  training;Therapeutic activities;Therapeutic exercise;Balance training;Neuromuscular re-education;Patient/family education    PT Goals (Current goals can be found in the Care Plan section)  Acute Rehab PT Goals Patient Stated Goal: Pt is minimally interested in participating with PT and does not provide any goals    Frequency Min 2X/week   Barriers to discharge        Co-evaluation               AM-PAC PT "6 Clicks" Mobility  Outcome Measure Help needed turning from your back to your side while in a flat bed without using bedrails?: A Little Help needed moving from lying on your back to sitting on the side of a flat bed without using bedrails?: A Little Help needed moving to and from a bed to a chair (including a wheelchair)?: A Little Help needed standing up from a chair using your arms (e.g., wheelchair or bedside chair)?: A Little Help needed to walk in hospital room?: A Little Help needed climbing 3-5 steps with a railing? : A Lot 6 Click Score: 17    End  of Session Equipment Utilized During Treatment: Gait belt Activity Tolerance: Patient limited by lethargy Patient left: in bed;with call bell/phone within reach;with bed alarm set Nurse Communication: Mobility status PT Visit Diagnosis: Unsteadiness on feet (R26.81);Muscle weakness (generalized) (M62.81)    Time: 1435-1500 PT Time Calculation (min) (ACUTE ONLY): 25 min   Charges:   PT Evaluation $PT Eval Low Complexity: 1 Low PT Treatments $Therapeutic Activity: 8-22 mins        Lyndel Safe Psalm Schappell PT, DPT, GCS   Jahnessa Vanduyn 03/03/2019, 3:55 PM

## 2019-03-03 NOTE — Progress Notes (Signed)
24 hr Urine Creatinine Clearance collection started for patient at 1500.

## 2019-03-04 LAB — GLUCOSE, CAPILLARY
Glucose-Capillary: 164 mg/dL — ABNORMAL HIGH (ref 70–99)
Glucose-Capillary: 245 mg/dL — ABNORMAL HIGH (ref 70–99)
Glucose-Capillary: 269 mg/dL — ABNORMAL HIGH (ref 70–99)
Glucose-Capillary: 228 mg/dL — ABNORMAL HIGH (ref 70–99)

## 2019-03-04 LAB — RENAL FUNCTION PANEL
Albumin: 3.1 g/dL — ABNORMAL LOW (ref 3.5–5.0)
Anion gap: 7 (ref 5–15)
BUN: 71 mg/dL — ABNORMAL HIGH (ref 8–23)
CO2: 31 mmol/L (ref 22–32)
Calcium: 9.2 mg/dL (ref 8.9–10.3)
Chloride: 102 mmol/L (ref 98–111)
Creatinine, Ser: 3.44 mg/dL — ABNORMAL HIGH (ref 0.44–1.00)
GFR calc Af Amer: 14 mL/min — ABNORMAL LOW
GFR calc non Af Amer: 12 mL/min — ABNORMAL LOW
Glucose, Bld: 146 mg/dL — ABNORMAL HIGH (ref 70–99)
Phosphorus: 5.7 mg/dL — ABNORMAL HIGH (ref 2.5–4.6)
Potassium: 5 mmol/L (ref 3.5–5.1)
Sodium: 140 mmol/L (ref 135–145)

## 2019-03-04 LAB — CREATININE CLEARANCE, URINE, 24 HOUR
Collection Interval-CRCL: 24 hours
Creatinine Clearance: 16 mL/min — ABNORMAL LOW (ref 75–115)
Creatinine, 24H Ur: 800 mg/d (ref 600–1800)
Creatinine, Urine: 32 mg/dL
Urine Total Volume-CRCL: 2500 mL

## 2019-03-04 NOTE — Plan of Care (Signed)
  Problem: Clinical Measurements: Goal: Ability to maintain clinical measurements within normal limits will improve Outcome: Not Progressing Note:  GFR is critically low most recently at only 14. Will continue to monitor renal status. Nephrologist following. Remains on a Lasix drip. Wenda Low [facility]

## 2019-03-04 NOTE — Progress Notes (Signed)
Progress Note  Patient Name: Carolyn Valdez Date of Encounter: 03/04/2019  Primary Cardiologist: Fletcher Anon  Subjective   Documented UOP of 289 mL for the past 24 hours and net - 3.2 L for the admission. Renal function is stable at 3.44 , though remains above her baseline with a range around ~ 2.3. Weight at admission of 120 kg (prior 112 kg in 10/2018) with a current weight of 117.9 kg, essentially unchanged from 24 hours prior.    SOB a little better. No chest pain. Lower extremities about the same with noted tenderness.  Inpatient Medications    Scheduled Meds: . aspirin  81 mg Oral Daily  . atorvastatin  40 mg Oral q1800  . citalopram  10 mg Oral Daily  . docusate sodium  100 mg Oral BID  . feeding supplement (ENSURE ENLIVE)  237 mL Oral BID BM  . fluticasone  2 spray Each Nare Daily  . heparin  5,000 Units Subcutaneous Q8H  . hydrALAZINE  25 mg Oral TID WC  . insulin aspart  0-5 Units Subcutaneous QHS  . insulin aspart  0-9 Units Subcutaneous TID WC  . insulin glargine  10 Units Subcutaneous Daily  . lamoTRIgine  25 mg Oral BID  . levETIRAcetam  250 mg Oral BID  . loratadine  10 mg Oral Daily  . Melatonin  5 mg Oral QHS  . metoprolol tartrate  25 mg Oral BID  . multivitamin with minerals  1 tablet Oral Daily  . nystatin   Topical BID  . OLANZapine  10 mg Oral QHS  . pantoprazole  40 mg Oral BID  . polyethylene glycol  17 g Oral Daily  . senna-docusate  1 tablet Oral QHS  . vitamin B-12  1,000 mcg Oral Daily   Continuous Infusions: . furosemide (LASIX) infusion 10 mg/hr (03/03/19 2141)   PRN Meds: acetaminophen, albuterol, clonazePAM, docusate sodium, ondansetron, promethazine   Vital Signs    Vitals:   03/03/19 1913 03/03/19 2142 03/04/19 0411 03/04/19 0412  BP: (!) 133/39 (!) 138/49  (!) 146/52  Pulse: (!) 57 61  65  Resp: 18   18  Temp: 99.1 F (37.3 C)   98.6 F (37 C)  TempSrc: Oral     SpO2: 96%   98%  Weight:   117.9 kg   Height:         Intake/Output Summary (Last 24 hours) at 03/04/2019 0851 Last data filed at 03/04/2019 0830 Gross per 24 hour  Intake 1010.44 ml  Output 3000 ml  Net -1989.56 ml   Filed Weights   03/02/19 0548 03/03/19 0500 03/04/19 0411  Weight: 120 kg 118 kg 117.9 kg    Telemetry    NSR with occasional PAC - Personally Reviewed  ECG    n/a - Personally Reviewed  Physical Exam   GEN: No acute distress.   Neck: JVD elevated to the angle of the mandible. Cardiac: RRR, II/VI systolic murmur at the base, no rubs, or gallops.  Respiratory: Continued crackles bilaterally.  GI: Soft, nontender, non-distended.   MS: Trace bilateral lower extremity edema; No deformity; Tender. Neuro:  Alert and oriented x 3; Nonfocal.  Psych: Normal affect.  Labs    Chemistry Recent Labs  Lab 03/02/19 0530 03/03/19 0523 03/04/19 0507  NA 136  136 140 140  K 5.1  5.0 5.0 5.0  CL 100  100 103 102  CO2 28  27 30 31   GLUCOSE 222*  228* 96 146*  BUN 67*  69* 69* 71*  CREATININE 3.48*  3.54* 3.44* 3.44*  CALCIUM 8.9  8.8* 9.1 9.2  PROT 7.2  --   --   ALBUMIN 3.5 3.1* 3.1*  AST 12*  --   --   ALT 8  --   --   ALKPHOS 100  --   --   BILITOT 0.4  --   --   GFRNONAA 12*  12* 12* 12*  GFRAA 14*  14* 14* 14*  ANIONGAP 8  9 7 7      Hematology Recent Labs  Lab 03/02/19 0530 03/03/19 0523  WBC 9.0 6.6  RBC 3.12* 2.74*  HGB 9.0* 7.9*  HCT 29.0* 25.7*  MCV 92.9 93.8  MCH 28.8 28.8  MCHC 31.0 30.7  RDW 14.8 14.6  PLT 268 223    Cardiac Enzymes Recent Labs  Lab 03/02/19 0530 03/02/19 1239 03/02/19 1544 03/02/19 2211  TROPONINI 0.31* 0.28* 0.26* 0.31*   No results for input(s): TROPIPOC in the last 168 hours.   BNP Recent Labs  Lab 03/02/19 0530  BNP 867.0*     DDimer No results for input(s): DDIMER in the last 168 hours.   Radiology    Dg Chest 2 View  Result Date: 03/03/2019 IMPRESSION: Cardiomegaly.  No acute abnormalities. Electronically Signed   By: Dorise Bullion III M.D   On: 03/03/2019 08:42   US Renal  Result Date: 03/02/2019 IMPRESSION: Negative renal ultrasound. Electronically Signed   By: Franchot Gallo M.D.   On: 03/02/2019 16:50    Cardiac Studies   2D Echo 03/02/2019: 1. The left ventricle has low normal systolic function, with an ejection fraction of 50-55%. The cavity size was mildly dilated. There is mild concentric left ventricular hypertrophy. Left ventricular diastolic Doppler parameters are consistent with pseudonormalization. 2. The right ventricle has normal systolic function. The cavity was normal. There is no increase in right ventricular wall thickness. Right ventricular systolic pressure is severely elevated with an estimated pressure of 72.7 mmHg. 3. Left atrial size was mildly dilated. 4. Right atrial size was mildly dilated. 5. The mitral valve is grossly normal. There is moderate mitral annular calcification present. Mild mitral valve stenosis. 6. The tricuspid valve is grossly normal. Tricuspid valve regurgitation is moderate. 7. The aortic valve is grossly normal. Aortic valve regurgitation was not assessed by color flow Doppler.  Patient Profile     78 y.o. female with history of HFpEF, PAH, recurrent UTI, GIB, HTN, HLD, CKD stage IV-V, and obesity who is being seen today for the evaluation ofCHF.  Assessment & Plan    1. Acute on chronic HFpEF/PAH: -She remains volume overloaded with a current weight of 117.9 kg which is up from 112.9 kg in 10/2018 -PA pressure of 72 mmHg this admission with a prior of 60-65 mmHg in 06/2018 -Lasix gtt increased to 10 mg/hr on 5/16, continue  -Strict I/O and daily standing weights -CHF education -Consider advance heart failure clinic for [place] in follow up  2. Elevated troponin: -Mildly elevated troponin felt to be supply demand ischemia in the setting of volume overload with CKD stage IV-V and anemia -Not a candidate for invasive evaluation/cath in the setting of  acute on CKD and anemia -Echo this admission showed preserved LVSF -No plans for inpatient ischemic evaluation at this time -No heparin gtt given down trending troponin, lack of angina, and anemia with prior GIB -ASA, Lipitor   3. Acute on CKD stage IV-V: -Renal function improving to  stable with Lasix gtt -Monitor closely -PTA ARB held -Nephrology on board  4. Anemia of chronic disease: -Relatively stable -She missed her last Procrit injection -Possibly exacerbating some of her dyspnea -Transfuse as needed -Outpatient follow up  5. Chronic respiratory failure with hypoxia: -On home O2   For questions or updates, please contact [place] Please consult [email or web address] for contact info under Cardiology/STEMI.    Signed, Christell Faith, PA-C [facility] Pager: [phone] 03/04/2019, 8:51 AM

## 2019-03-04 NOTE — Progress Notes (Signed)
[place] [facility]                                                                                                                                                                                  Patient Demographics   Carolyn Valdez, is a 78 y.o. female, DOB - September 02, 1941, XNA:[id number]  Admit date - 03/02/2019   Admitting Physician Vaughan Basta, MD  Outpatient Primary MD for the patient is [facility]   LOS - 2  Subjective: Patient breathing is improved complains of some back pain   Review of Systems:   CONSTITUTIONAL: No documented fever. No fatigue, weakness. No weight gain, no weight loss.  EYES: No blurry or double vision.  ENT: No tinnitus. No postnasal drip. No redness of the oropharynx.  RESPIRATORY: No cough, no wheeze, no hemoptysis.  Positive dyspnea.  CARDIOVASCULAR: No chest pain. No orthopnea. No palpitations. No syncope.  GASTROINTESTINAL: No nausea, no vomiting or diarrhea. No abdominal pain. No melena or hematochezia.  GENITOURINARY: No dysuria or hematuria.  ENDOCRINE: No polyuria or nocturia. No heat or cold intolerance.  HEMATOLOGY: No anemia. No bruising. No bleeding.  INTEGUMENTARY: No rashes. No lesions.  MUSCULOSKELETAL: No arthritis. No swelling. No gout.  Positive back pain NEUROLOGIC: No numbness, tingling, or ataxia. No seizure-type activity.  PSYCHIATRIC: No anxiety. No insomnia. No ADD.    Vitals:   Vitals:   03/03/19 2142 03/04/19 0411 03/04/19 0412 03/04/19 0948  BP: (!) 138/49  (!) 146/52 (!) 129/41  Pulse: 61  65 62  Resp:   18 19  Temp:   98.6 F (37 C)   TempSrc:      SpO2:   98% 95%  Weight:  117.9 kg    Height:        Wt Readings from Last 3 Encounters:  03/04/19 117.9 kg  10/23/18 112.9 kg  09/28/18 113.2 kg     Intake/Output Summary (Last 24 hours) at 03/04/2019 1347 Last data filed at 03/04/2019 0830 Gross per 24 hour  Intake 653.44 ml  Output 1600 ml  Net -946.56 ml     Physical Exam:   GENERAL: Pleasant-appearing in no apparent distress.  HEAD, EYES, EARS, NOSE AND THROAT: Atraumatic, normocephalic. Extraocular muscles are intact. Pupils equal and reactive to light. Sclerae anicteric. No conjunctival injection. No oro-pharyngeal erythema.  NECK: Supple. There is no jugular venous distention. No bruits, no lymphadenopathy, no thyromegaly.  HEART: Regular rate and rhythm,. No murmurs, no rubs, no clicks.  LUNGS: Bilateral crackles throughout both lung.  ABDOMEN: Soft, flat, nontender, nondistended. Has good bowel sounds. No hepatosplenomegaly appreciated.  EXTREMITIES: No evidence of any cyanosis, clubbing, or peripheral  edema.  +2 pedal and radial pulses bilaterally.  NEUROLOGIC: The patient is alert, awake, and oriented x3 with no focal motor or sensory deficits appreciated bilaterally.  SKIN: Moist and warm with no rashes appreciated.  Psych: Not anxious, depressed LN: No inguinal LN enlargement    Antibiotics   Anti-infectives (From admission, onward)   None      Medications   Scheduled Meds: . aspirin  81 mg Oral Daily  . atorvastatin  40 mg Oral q1800  . citalopram  10 mg Oral Daily  . docusate sodium  100 mg Oral BID  . feeding supplement (ENSURE ENLIVE)  237 mL Oral BID BM  . fluticasone  2 spray Each Nare Daily  . heparin  5,000 Units Subcutaneous Q8H  . hydrALAZINE  25 mg Oral TID WC  . insulin aspart  0-5 Units Subcutaneous QHS  . insulin aspart  0-9 Units Subcutaneous TID WC  . insulin glargine  10 Units Subcutaneous Daily  . lamoTRIgine  25 mg Oral BID  . levETIRAcetam  250 mg Oral BID  . loratadine  10 mg Oral Daily  . Melatonin  5 mg Oral QHS  . metoprolol tartrate  25 mg Oral BID  . multivitamin with minerals  1 tablet Oral Daily  . nystatin   Topical BID  . OLANZapine  10 mg Oral QHS  . pantoprazole  40 mg Oral BID  . polyethylene glycol  17 g Oral Daily  . senna-docusate  1 tablet Oral QHS  . vitamin B-12  1,000  mcg Oral Daily   Continuous Infusions: . furosemide (LASIX) infusion 10 mg/hr (03/03/19 2141)   PRN Meds:.acetaminophen, albuterol, clonazePAM, docusate sodium, ondansetron, promethazine   Data Review:   Micro Results Recent Results (from the past 240 hour(s))  SARS Coronavirus 2 (CEPHEID - Performed in [facility] hospital lab), Hosp Order     Status: None   Collection Time: 03/02/19  6:35 AM  Result Value Ref Range Status   SARS Coronavirus 2 NEGATIVE NEGATIVE Final    Comment: (NOTE) If result is NEGATIVE SARS-CoV-2 target nucleic acids are NOT DETECTED. The SARS-CoV-2 RNA is generally detectable in upper and lower  respiratory specimens during the acute phase of infection. The lowest  concentration of SARS-CoV-2 viral copies this assay can detect is 250  copies / mL. A negative result does not preclude SARS-CoV-2 infection  and should not be used as the sole basis for treatment or other  patient management decisions.  A negative result may occur with  improper specimen collection / handling, submission of specimen other  than nasopharyngeal swab, presence of viral mutation(s) within the  areas targeted by this assay, and inadequate number of viral copies  (<250 copies / mL). A negative result must be combined with clinical  observations, patient history, and epidemiological information. If result is POSITIVE SARS-CoV-2 target nucleic acids are DETECTED. The SARS-CoV-2 RNA is generally detectable in upper and lower  respiratory specimens dur ing the acute phase of infection.  Positive  results are indicative of active infection with SARS-CoV-2.  Clinical  correlation with patient history and other diagnostic information is  necessary to determine patient infection status.  Positive results do  not rule out bacterial infection or co-infection with other viruses. If result is PRESUMPTIVE POSTIVE SARS-CoV-2 nucleic acids MAY BE PRESENT.   A presumptive positive result was  obtained on the submitted specimen  and confirmed on repeat testing.  While 2019 novel coronavirus  (SARS-CoV-2) nucleic acids may  be present in the submitted sample  additional confirmatory testing may be necessary for epidemiological  and / or clinical management purposes  to differentiate between  SARS-CoV-2 and other Sarbecovirus currently known to infect humans.  If clinically indicated additional testing with an alternate test  methodology [phone]) is advised. The SARS-CoV-2 RNA is generally  detectable in upper and lower respiratory sp ecimens during the acute  phase of infection. The expected result is Negative. Fact Sheet for Patients:  StrictlyIdeas.no Fact Sheet for Healthcare Providers: BankingDealers.co.za This test is not yet approved or cleared by the [place] FDA and has been authorized for detection and/or diagnosis of SARS-CoV-2 by FDA under an Emergency Use Authorization (EUA).  This EUA will remain in effect (meaning this test can be used) for the duration of the COVID-19 declaration under Section 564(b)(1) of the Act, 21 U.S.C. section 360bbb-3(b)(1), unless the authorization is terminated or revoked sooner. Performed at [facility], [address] [place],  [address]     Radiology Reports Dg Chest 2 View  Result Date: 03/03/2019 CLINICAL DATA:  78 year old female with a known history of diastolic congestive heart failure. EXAM: CHEST - 2 VIEW COMPARISON:  Mar 02, 2019 FINDINGS: Stable cardiomegaly. The hila and mediastinum are normal. No pulmonary nodules, masses, focal infiltrates, or overt edema. IMPRESSION: Cardiomegaly.  No acute abnormalities. Electronically Signed   By: Dorise Bullion III M.D   On: 03/03/2019 08:42   Dg Chest 2 View  Result Date: 03/02/2019 CLINICAL DATA:  78 year old female with shortness of breath for 2 days. Former smoker. EXAM: CHEST - 2 VIEW COMPARISON:  Portable  chest 08/03/2018 and earlier. FINDINGS: Seated AP and lateral views of the chest. Stable cardiomegaly and mediastinal contours. Visualized tracheal air column is within normal limits. Stable lung volumes. No pneumothorax, pleural effusion or confluent pulmonary opacity. Calcified aortic atherosclerosis. Acute on chronic pulmonary interstitial opacity. No pleural fluid identified in the fissures on the lateral. Osteopenia.  Paucity of bowel gas in the upper abdomen. IMPRESSION: 1. Chronic cardiomegaly with acute on chronic pulmonary interstitial opacity. No pleural fluid identified. Consider pulmonary interstitial edema and viral/atypical respiratory infection. 2.  Aortic Atherosclerosis (ICD10-I70.0). Electronically Signed   By: Genevie Ann M.D.   On: 03/02/2019 06:57   US Renal  Result Date: 03/02/2019 CLINICAL DATA:  Acute renal failure EXAM: RENAL / URINARY TRACT ULTRASOUND COMPLETE COMPARISON:  Renal ultrasound 07/05/2018 FINDINGS: Right Kidney: Renal measurements: 10.8 x 6.6 x 6.5 cm = volume: 243 mL . Echogenicity within normal limits. No mass or hydronephrosis visualized. Left Kidney: Renal measurements: 10.8 x 6.7 x 6.7 cm = volume: 254 mL. Echogenicity within normal limits. No mass or hydronephrosis visualized. Bladder: Appears normal for degree of bladder distention. IMPRESSION: Negative renal ultrasound. Electronically Signed   By: Franchot Gallo M.D.   On: 03/02/2019 16:50     CBC Recent Labs  Lab 03/02/19 0530 03/03/19 0523  WBC 9.0 6.6  HGB 9.0* 7.9*  HCT 29.0* 25.7*  PLT 268 223  MCV 92.9 93.8  MCH 28.8 28.8  MCHC 31.0 30.7  RDW 14.8 14.6    Chemistries  Recent Labs  Lab 03/02/19 0530 03/03/19 0523 03/04/19 0507  NA 136  136 140 140  K 5.1  5.0 5.0 5.0  CL 100  100 103 102  CO2 28  27 30 31   GLUCOSE 222*  228* 96 146*  BUN 67*  69* 69* 71*  CREATININE 3.48*  3.54* 3.44* 3.44*  CALCIUM 8.9  8.8* 9.1 9.2  AST 12*  --   --   ALT 8  --   --   ALKPHOS 100  --   --    BILITOT 0.4  --   --    ------------------------------------------------------------------------------------------------------------------ estimated creatinine clearance is 18.5 mL/min (A) (by C-G formula based on SCr of 3.44 mg/dL (H)). ------------------------------------------------------------------------------------------------------------------ No results for input(s): HGBA1C in the last 72 hours. ------------------------------------------------------------------------------------------------------------------ No results for input(s): CHOL, HDL, LDLCALC, TRIG, CHOLHDL, LDLDIRECT in the last 72 hours. ------------------------------------------------------------------------------------------------------------------ No results for input(s): TSH, T4TOTAL, T3FREE, THYROIDAB in the last 72 hours.  Invalid input(s): FREET3 ------------------------------------------------------------------------------------------------------------------ No results for input(s): VITAMINB12, FOLATE, FERRITIN, TIBC, IRON, RETICCTPCT in the last 72 hours.  Coagulation profile No results for input(s): INR, PROTIME in the last 168 hours.  No results for input(s): DDIMER in the last 72 hours.  Cardiac Enzymes Recent Labs  Lab 03/02/19 1239 03/02/19 1544 03/02/19 2211  TROPONINI 0.28* 0.26* 0.31*   ------------------------------------------------------------------------------------------------------------------ Invalid input(s): POCBNP    Assessment & Plan   *Acute on chronic diastolic congestive heart failure Continue IV Lasix drip dose increased by cardiology Appreciate cardiology input  *Acute renal failure on CKD stage III Monitor renal function follow, if worsens will ask nephrology to see  *Hyperkalemia Stable   *Elevated troponin Due to demand ischemia  *Diabetes mellitus  Keep on glipizide and Lantus and sliding scale coverage.  *Hyperlipidemia Continue  statin.  *Hypertension Blood pressure borderline will adjust antihypertensives     Code Status Orders  (From admission, onward)         Start     Ordered   03/02/19 1211  Full code  Continuous     03/02/19 1210        Code Status History    Date Active Date Inactive Code Status Order ID Comments User Context   08/04/2018 0556 08/07/2018 1951 DNR [id number]  Harrie Foreman, MD Inpatient   07/06/2018 1407 07/14/2018 1603 DNR [id number]  Vaughan Basta, MD Inpatient   07/04/2018 2200 07/06/2018 1407 Full Code [id number]  Demetrios Loll, MD Inpatient   04/22/2018 1702 04/26/2018 1851 DNR [id number]  Max Sane, MD Inpatient   02/21/2017 1629 02/24/2017 1747 DNR [id number]  Fritzi Mandes, MD Inpatient   01/31/2017 1617 02/04/2017 1350 DNR [id number]  Max Sane, MD Inpatient   01/23/2017 1320 01/31/2017 1617 Full Code [id number]  Henreitta Leber, MD Inpatient   10/05/2016 1216 10/26/2016 1740 DNR [id number]  Knox Royalty, NP Inpatient   09/24/2016 1731 10/05/2016 1216 Full Code [id number]  Gladstone Lighter, MD Inpatient   09/01/2016 0217 09/05/2016 1628 Full Code [id number]  Hugel[place]yer, [place], DO Inpatient   08/09/2016 2027 08/12/2016 2015 Full Code [id number]  Henreitta Leber, MD Inpatient   09/30/2015 1423 10/07/2015 1737 Full Code [id number]  Loletha Grayer, MD ED   09/05/2015 1756 09/09/2015 1738 DNR [id number]  Loletha Grayer, MD ED    Advance Directive Documentation     Most Recent Value  Type of Advance Directive  Healthcare Power of [place], Living will  Pre-existing out of facility DNR order (yellow form or pink MOST form)  -  "MOST" Form in Place?  -           Consults cardiology  DVT Prophylaxis Heparin  Lab Results  Component Value Date   PLT 223 03/03/2019     Time Spent in minutes   35 minutes greater than 50% of time spent in care coordination and counseling patient regarding the condition and plan of  care.   Dustin Flock M.D on 03/04/2019 at 1:47  PM  Between 7am to 6pm - Pager - [phone]  After 6pm go to [email or web address] - Proofreader  [employer]   Office  [phone]

## 2019-03-04 NOTE — Progress Notes (Signed)
[facility], [place] 03/04/19  Subjective:   LOS: 2 05/16 0701 - 05/17 0700 In: 1010.4 [P.O.:857; I.V.:153.4] Out: 1300 [Urine:1300] Patient is a resident of [facility] nursing facility Patient requires chronic oxygen.  Denies any acute shortness of breath today Overall doing fair.    Good response to IV Lasix infusion.  Urine output 1300 cc, 850 cc already reported today Serum creatinine about the same at 3.4   Objective:  Vital signs in last 24 hours:  Temp:  [97.7 F (36.5 C)-99.1 F (37.3 C)] 98.6 F (37 C) (05/17 0412) Pulse Rate:  [57-71] 62 (05/17 0948) Resp:  [18-19] 19 (05/17 0948) BP: (129-146)/(39-55) 129/41 (05/17 0948) SpO2:  [95 %-98 %] 95 % (05/17 0948) Weight:  [117.9 kg] 117.9 kg (05/17 0411)  Weight change: -0.1 kg Filed Weights   03/02/19 0548 03/03/19 0500 03/04/19 0411  Weight: 120 kg 118 kg 117.9 kg    Intake/Output:    Intake/Output Summary (Last 24 hours) at 03/04/2019 1437 Last data filed at 03/04/2019 0830 Gross per 24 hour  Intake 653.44 ml  Output 1600 ml  Net -946.56 ml     Physical Exam: General:  Obese, no acute distress, laying in the bed  HEENT  [place] O2, moist oral mucous membranes, anicteric  Neck  supple  Pulm/lungs  normal breathing effort, [place] O2  CVS/Heart  soft systolic murmur, no rub  Abdomen:   Soft, obese, nontender  Extremities:  2+ pitting edema, tenderness over lower legs to palpation  Neurologic:  Alert, able to follow commands  Skin:  No acute rashes   External urinary catheter       Basic Metabolic Panel:  Recent Labs  Lab 03/02/19 0530 03/03/19 0523 03/04/19 0507  NA 136  136 140 140  K 5.1  5.0 5.0 5.0  CL 100  100 103 102  CO2 28  27 30 31   GLUCOSE 222*  228* 96 146*  BUN 67*  69* 69* 71*  CREATININE 3.48*  3.54* 3.44* 3.44*  CALCIUM 8.9  8.8* 9.1 9.2  PHOS  --  6.1* 5.7*     CBC: Recent Labs  Lab 03/02/19 0530 03/03/19 0523  WBC 9.0 6.6  HGB 9.0* 7.9*   HCT 29.0* 25.7*  MCV 92.9 93.8  PLT 268 223     No results found for: HEPBSAG, HEPBSAB, HEPBIGM    Microbiology:  Recent Results (from the past 240 hour(s))  SARS Coronavirus 2 (CEPHEID - Performed in [facility] hospital lab), Hosp Order     Status: None   Collection Time: 03/02/19  6:35 AM  Result Value Ref Range Status   SARS Coronavirus 2 NEGATIVE NEGATIVE Final    Comment: (NOTE) If result is NEGATIVE SARS-CoV-2 target nucleic acids are NOT DETECTED. The SARS-CoV-2 RNA is generally detectable in upper and lower  respiratory specimens during the acute phase of infection. The lowest  concentration of SARS-CoV-2 viral copies this assay can detect is 250  copies / mL. A negative result does not preclude SARS-CoV-2 infection  and should not be used as the sole basis for treatment or other  patient management decisions.  A negative result may occur with  improper specimen collection / handling, submission of specimen other  than nasopharyngeal swab, presence of viral mutation(s) within the  areas targeted by this assay, and inadequate number of viral copies  (<250 copies / mL). A negative result must be combined with clinical  observations, patient history, and epidemiological information. If  result is POSITIVE SARS-CoV-2 target nucleic acids are DETECTED. The SARS-CoV-2 RNA is generally detectable in upper and lower  respiratory specimens dur ing the acute phase of infection.  Positive  results are indicative of active infection with SARS-CoV-2.  Clinical  correlation with patient history and other diagnostic information is  necessary to determine patient infection status.  Positive results do  not rule out bacterial infection or co-infection with other viruses. If result is PRESUMPTIVE POSTIVE SARS-CoV-2 nucleic acids MAY BE PRESENT.   A presumptive positive result was obtained on the submitted specimen  and confirmed on repeat testing.  While 2019 novel coronavirus   (SARS-CoV-2) nucleic acids may be present in the submitted sample  additional confirmatory testing may be necessary for epidemiological  and / or clinical management purposes  to differentiate between  SARS-CoV-2 and other Sarbecovirus currently known to infect humans.  If clinically indicated additional testing with an alternate test  methodology [phone]) is advised. The SARS-CoV-2 RNA is generally  detectable in upper and lower respiratory sp ecimens during the acute  phase of infection. The expected result is Negative. Fact Sheet for Patients:  StrictlyIdeas.no Fact Sheet for Healthcare Providers: BankingDealers.co.za This test is not yet approved or cleared by the [place] FDA and has been authorized for detection and/or diagnosis of SARS-CoV-2 by FDA under an Emergency Use Authorization (EUA).  This EUA will remain in effect (meaning this test can be used) for the duration of the COVID-19 declaration under Section 564(b)(1) of the Act, 21 U.S.C. section 360bbb-3(b)(1), unless the authorization is terminated or revoked sooner. Performed at [facility], [address] [place], [place] [address]     Coagulation Studies: No results for input(s): LABPROT, INR in the last 72 hours.  Urinalysis: No results for input(s): COLORURINE, LABSPEC, PHURINE, GLUCOSEU, HGBUR, BILIRUBINUR, KETONESUR, PROTEINUR, UROBILINOGEN, NITRITE, LEUKOCYTESUR in the last 72 hours.  Invalid input(s): APPERANCEUR    Imaging: Dg Chest 2 View  Result Date: 03/03/2019 CLINICAL DATA:  78 year old female with a known history of diastolic congestive heart failure. EXAM: CHEST - 2 VIEW COMPARISON:  Mar 02, 2019 FINDINGS: Stable cardiomegaly. The hila and mediastinum are normal. No pulmonary nodules, masses, focal infiltrates, or overt edema. IMPRESSION: Cardiomegaly.  No acute abnormalities. Electronically Signed   By: Dorise Bullion III M.D   On:  03/03/2019 08:42   US Renal  Result Date: 03/02/2019 CLINICAL DATA:  Acute renal failure EXAM: RENAL / URINARY TRACT ULTRASOUND COMPLETE COMPARISON:  Renal ultrasound 07/05/2018 FINDINGS: Right Kidney: Renal measurements: 10.8 x 6.6 x 6.5 cm = volume: 243 mL . Echogenicity within normal limits. No mass or hydronephrosis visualized. Left Kidney: Renal measurements: 10.8 x 6.7 x 6.7 cm = volume: 254 mL. Echogenicity within normal limits. No mass or hydronephrosis visualized. Bladder: Appears normal for degree of bladder distention. IMPRESSION: Negative renal ultrasound. Electronically Signed   By: Franchot Gallo M.D.   On: 03/02/2019 16:50     Medications:   . furosemide (LASIX) infusion 10 mg/hr (03/03/19 2141)   . aspirin  81 mg Oral Daily  . atorvastatin  40 mg Oral q1800  . citalopram  10 mg Oral Daily  . docusate sodium  100 mg Oral BID  . feeding supplement (ENSURE ENLIVE)  237 mL Oral BID BM  . fluticasone  2 spray Each Nare Daily  . heparin  5,000 Units Subcutaneous Q8H  . hydrALAZINE  25 mg Oral TID WC  . insulin aspart  0-5 Units Subcutaneous QHS  .  insulin aspart  0-9 Units Subcutaneous TID WC  . insulin glargine  10 Units Subcutaneous Daily  . lamoTRIgine  25 mg Oral BID  . levETIRAcetam  250 mg Oral BID  . loratadine  10 mg Oral Daily  . Melatonin  5 mg Oral QHS  . metoprolol tartrate  25 mg Oral BID  . multivitamin with minerals  1 tablet Oral Daily  . nystatin   Topical BID  . OLANZapine  10 mg Oral QHS  . pantoprazole  40 mg Oral BID  . polyethylene glycol  17 g Oral Daily  . senna-docusate  1 tablet Oral QHS  . vitamin B-12  1,000 mcg Oral Daily   acetaminophen, albuterol, clonazePAM, docusate sodium, ondansetron, promethazine  Assessment/ Plan:  78 y.o. Caucasian female with with congestive heart failure , hypertension, diabetes mellitus type II, obstructive sleep apnea, nephrolithiasis   Principal Problem:   Acute on chronic diastolic CHF (congestive heart  failure) (HCC) Active Problems:   Acute renal failure superimposed on chronic kidney disease ([place])   #.  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   Last known outpatient creatinine from December 12 is 2.69/GFR 17 Underlying CKD likely secondary to atherosclerosis, diabetes and hypertension Electrolytes and volume status are acceptable.  No acute indication for dialysis at present 24-hour urine creatinine clearance is in progress.  Discussed with patient regarding need for dialysis in future.  Patient agrees to take dialysis yesterday-we will continue to follow up with her in outpatient setting  Recent Labs    03/02/19 0530 03/03/19 0523 03/04/19 0507  CREATININE 3.48*  3.54* 3.44* 3.44*    #. Anemia of CKD  Lab Results  Component Value Date   HGB 7.9 (L) 03/03/2019  Continue to monitor closely Followed by Dr. Grayland Ormond as outpatient  #.  Hyperphosphatemia  No results found for: PTH Lab Results  Component Value Date   PHOS 5.7 (H) 03/04/2019  Likely due to CKD Monitor closely  #. Diabetes type 2 with CKD Hemoglobin A1C (%)  Date Value  09/04/2014 6.2   Hgb A1c MFr Bld (%)  Date Value  04/22/2018 7.6 (H)    #Grade 2 diastolic dysfunction with pulmonary hypertension, tricuspid regurgitation, generalized edema Continue IV furosemide infusion for volume overload for another day 24 hr urine for creatinine clearance is in progress    LOS: Startup 5/17/20202:37 PM  [place], [place]

## 2019-03-05 LAB — GLUCOSE, CAPILLARY
Glucose-Capillary: 199 mg/dL — ABNORMAL HIGH (ref 70–99)
Glucose-Capillary: 284 mg/dL — ABNORMAL HIGH (ref 70–99)
Glucose-Capillary: 302 mg/dL — ABNORMAL HIGH (ref 70–99)
Glucose-Capillary: 308 mg/dL — ABNORMAL HIGH (ref 70–99)

## 2019-03-05 LAB — RENAL FUNCTION PANEL
Albumin: 3.2 g/dL — ABNORMAL LOW (ref 3.5–5.0)
Anion gap: 10 (ref 5–15)
BUN: 79 mg/dL — ABNORMAL HIGH (ref 8–23)
CO2: 31 mmol/L (ref 22–32)
Calcium: 8.7 mg/dL — ABNORMAL LOW (ref 8.9–10.3)
Chloride: 93 mmol/L — ABNORMAL LOW (ref 98–111)
Creatinine, Ser: 3.81 mg/dL — ABNORMAL HIGH (ref 0.44–1.00)
GFR calc Af Amer: 13 mL/min — ABNORMAL LOW (ref >60)
GFR calc non Af Amer: 11 mL/min — ABNORMAL LOW (ref >60)
Glucose, Bld: 238 mg/dL — ABNORMAL HIGH (ref 70–99)
Phosphorus: 6 mg/dL — ABNORMAL HIGH (ref 2.5–4.6)
Potassium: 5.2 mmol/L — ABNORMAL HIGH (ref 3.5–5.1)
Sodium: 134 mmol/L — ABNORMAL LOW (ref 135–145)

## 2019-03-05 MED ORDER — SODIUM CHLORIDE 0.9% FLUSH
3.0000 mL | Freq: Two times a day (BID) | INTRAVENOUS | Status: DC
  Administered 2019-03-05: 10 mL via INTRAVENOUS
  Administered 2019-03-05 – 2019-03-18 (×21): 3 mL via INTRAVENOUS

## 2019-03-05 MED ORDER — INSULIN ASPART 100 UNIT/ML ~~LOC~~ SOLN
3.0000 [IU] | Freq: Three times a day (TID) | SUBCUTANEOUS | Status: DC
  Administered 2019-03-05 – 2019-03-06 (×2): 3 [IU] via SUBCUTANEOUS
  Filled 2019-03-05 (×2): qty 1

## 2019-03-05 NOTE — Progress Notes (Signed)
Inpatient Diabetes Program Recommendations  AACE/ADA: New Consensus Statement on Inpatient Glycemic Control   Target Ranges:  Prepandial:   less than 140 mg/dL      Peak postprandial:   less than 180 mg/dL (1-2 hours)      Critically ill patients:  140 - 180 mg/dL   Results for Carolyn Valdez, Carolyn Valdez (MRN [id number]) as of 03/05/2019 10:15  Ref. Range 03/04/2019 08:30 03/04/2019 11:38 03/04/2019 17:54 03/04/2019 21:12 03/05/2019 07:21  Glucose-Capillary Latest Ref Range: 70 - 99 mg/dL 164 (H) 245 (H) 269 (H) 228 (H) 199 (H)   Review of Glycemic Control  Diabetes history: DM2 Outpatient Diabetes medications: Lantus 55 units QHS, Glipizide 5 mg daily, Novolog 0-5 units TID per correction scale Current orders for Inpatient glycemic control: Lantus 10 units daily, Novolog 0-9 units TID with meals, Novolog 0-5 units QHS  Inpatient Diabetes Program Recommendations:   Insulin - Meal Coverage: Please consider ordering Novolog 5 units TID with meals for meal coverage if patient eats at least 50% of meals.  Thanks, Barnie Alderman, RN, MSN, CDE Diabetes Coordinator Inpatient Diabetes Program [phone] (Team Pager from 8am to 5pm)

## 2019-03-05 NOTE — Progress Notes (Signed)
[place] at [facility]                                                                                                                                                                                  Patient Demographics   Shauntay Brunelli, is a 78 y.o. female, DOB - 17-Jan-1941, DXI:[id number]  Admit date - 03/02/2019   Admitting Physician Vaughan Basta, MD  Outpatient Primary MD for the patient is [facility]   LOS - 3  Subjective: Patient complains of pain in her back.  Breathing is stable renal function has gotten little worse urine output is good   Review of Systems:   CONSTITUTIONAL: No documented fever. No fatigue, weakness. No weight gain, no weight loss.  EYES: No blurry or double vision.  ENT: No tinnitus. No postnasal drip. No redness of the oropharynx.  RESPIRATORY: No cough, no wheeze, no hemoptysis.  Positive dyspnea.  CARDIOVASCULAR: No chest pain. No orthopnea. No palpitations. No syncope.  GASTROINTESTINAL: No nausea, no vomiting or diarrhea. No abdominal pain. No melena or hematochezia.  GENITOURINARY: No dysuria or hematuria.  ENDOCRINE: No polyuria or nocturia. No heat or cold intolerance.  HEMATOLOGY: No anemia. No bruising. No bleeding.  INTEGUMENTARY: No rashes. No lesions.  MUSCULOSKELETAL: No arthritis. No swelling. No gout.  Positive back pain NEUROLOGIC: No numbness, tingling, or ataxia. No seizure-type activity.  PSYCHIATRIC: No anxiety. No insomnia. No ADD.    Vitals:   Vitals:   03/04/19 1825 03/04/19 1922 03/05/19 0400 03/05/19 0719  BP: (!) 138/47 (!) 134/48 (!) 145/55 (!) 142/50  Pulse: 66 62 (!) 58 (!) 59  Resp: 18 18 18 19   Temp: 98.6 F (37 C) 98.7 F (37.1 C) 98 F (36.7 C) (!) 97.5 F (36.4 C)  TempSrc: Oral Oral Oral Oral  SpO2: 97% 99% 100% 92%  Weight:   117.5 kg   Height:        Wt Readings from Last 3 Encounters:  03/05/19 117.5 kg  10/23/18 112.9 kg  09/28/18 113.2 kg      Intake/Output Summary (Last 24 hours) at 03/05/2019 1118 Last data filed at 03/05/2019 1004 Gross per 24 hour  Intake 360 ml  Output 4100 ml  Net -3740 ml    Physical Exam:   GENERAL: Pleasant-appearing in no apparent distress.  HEAD, EYES, EARS, NOSE AND THROAT: Atraumatic, normocephalic. Extraocular muscles are intact. Pupils equal and reactive to light. Sclerae anicteric. No conjunctival injection. No oro-pharyngeal erythema.  NECK: Supple. There is no jugular venous distention. No bruits, no lymphadenopathy, no thyromegaly.  HEART: Regular rate and rhythm,. No murmurs, no rubs, no clicks.  LUNGS: Bilateral crackles throughout  both lung.  ABDOMEN: Soft, flat, nontender, nondistended. Has good bowel sounds. No hepatosplenomegaly appreciated.  EXTREMITIES: No evidence of any cyanosis, clubbing, or peripheral edema.  +2 pedal and radial pulses bilaterally.  NEUROLOGIC: The patient is alert, awake, and oriented x3 with no focal motor or sensory deficits appreciated bilaterally.  SKIN: Moist and warm with no rashes appreciated.  Psych: Not anxious, depressed LN: No inguinal LN enlargement    Antibiotics   Anti-infectives (From admission, onward)   None      Medications   Scheduled Meds: . aspirin  81 mg Oral Daily  . atorvastatin  40 mg Oral q1800  . citalopram  10 mg Oral Daily  . docusate sodium  100 mg Oral BID  . feeding supplement (ENSURE ENLIVE)  237 mL Oral BID BM  . fluticasone  2 spray Each Nare Daily  . heparin  5,000 Units Subcutaneous Q8H  . hydrALAZINE  25 mg Oral TID WC  . insulin aspart  0-5 Units Subcutaneous QHS  . insulin aspart  0-9 Units Subcutaneous TID WC  . insulin glargine  10 Units Subcutaneous Daily  . lamoTRIgine  25 mg Oral BID  . levETIRAcetam  250 mg Oral BID  . loratadine  10 mg Oral Daily  . Melatonin  5 mg Oral QHS  . metoprolol tartrate  25 mg Oral BID  . multivitamin with minerals  1 tablet Oral Daily  . nystatin   Topical BID   . OLANZapine  10 mg Oral QHS  . pantoprazole  40 mg Oral BID  . polyethylene glycol  17 g Oral Daily  . senna-docusate  1 tablet Oral QHS  . vitamin B-12  1,000 mcg Oral Daily   Continuous Infusions: . furosemide (LASIX) infusion 10 mg/hr (03/04/19 2357)   PRN Meds:.acetaminophen, albuterol, clonazePAM, docusate sodium, ondansetron, promethazine   Data Review:   Micro Results Recent Results (from the past 240 hour(s))  SARS Coronavirus 2 (CEPHEID - Performed in [place] hospital lab), Hosp Order     Status: None   Collection Time: 03/02/19  6:35 AM  Result Value Ref Range Status   SARS Coronavirus 2 NEGATIVE NEGATIVE Final    Comment: (NOTE) If result is NEGATIVE SARS-CoV-2 target nucleic acids are NOT DETECTED. The SARS-CoV-2 RNA is generally detectable in upper and lower  respiratory specimens during the acute phase of infection. The lowest  concentration of SARS-CoV-2 viral copies this assay can detect is 250  copies / mL. A negative result does not preclude SARS-CoV-2 infection  and should not be used as the sole basis for treatment or other  patient management decisions.  A negative result may occur with  improper specimen collection / handling, submission of specimen other  than nasopharyngeal swab, presence of viral mutation(s) within the  areas targeted by this assay, and inadequate number of viral copies  (<250 copies / mL). A negative result must be combined with clinical  observations, patient history, and epidemiological information. If result is POSITIVE SARS-CoV-2 target nucleic acids are DETECTED. The SARS-CoV-2 RNA is generally detectable in upper and lower  respiratory specimens dur ing the acute phase of infection.  Positive  results are indicative of active infection with SARS-CoV-2.  Clinical  correlation with patient history and other diagnostic information is  necessary to determine patient infection status.  Positive results do  not rule out  bacterial infection or co-infection with other viruses. If result is PRESUMPTIVE POSTIVE SARS-CoV-2 nucleic acids MAY BE PRESENT.   A  presumptive positive result was obtained on the submitted specimen  and confirmed on repeat testing.  While 2019 novel coronavirus  (SARS-CoV-2) nucleic acids may be present in the submitted sample  additional confirmatory testing may be necessary for epidemiological  and / or clinical management purposes  to differentiate between  SARS-CoV-2 and other Sarbecovirus currently known to infect humans.  If clinically indicated additional testing with an alternate test  methodology [phone]) is advised. The SARS-CoV-2 RNA is generally  detectable in upper and lower respiratory sp ecimens during the acute  phase of infection. The expected result is Negative. Fact Sheet for Patients:  StrictlyIdeas.no Fact Sheet for Healthcare Providers: BankingDealers.co.za This test is not yet approved or cleared by the [place] FDA and has been authorized for detection and/or diagnosis of SARS-CoV-2 by FDA under an Emergency Use Authorization (EUA).  This EUA will remain in effect (meaning this test can be used) for the duration of the COVID-19 declaration under Section 564(b)(1) of the Act, 21 U.S.C. section 360bbb-3(b)(1), unless the authorization is terminated or revoked sooner. Performed at [facility], [address], [place], [place] [address]     Radiology Reports Dg Chest 2 View  Result Date: 03/03/2019 CLINICAL DATA:  78 year old female with a known history of diastolic congestive heart failure. EXAM: CHEST - 2 VIEW COMPARISON:  Mar 02, 2019 FINDINGS: Stable cardiomegaly. The hila and mediastinum are normal. No pulmonary nodules, masses, focal infiltrates, or overt edema. IMPRESSION: Cardiomegaly.  No acute abnormalities. Electronically Signed   By: Dorise Bullion III M.D   On: 03/03/2019 08:42   Dg  Chest 2 View  Result Date: 03/02/2019 CLINICAL DATA:  78 year old female with shortness of breath for 2 days. Former smoker. EXAM: CHEST - 2 VIEW COMPARISON:  Portable chest 08/03/2018 and earlier. FINDINGS: Seated AP and lateral views of the chest. Stable cardiomegaly and mediastinal contours. Visualized tracheal air column is within normal limits. Stable lung volumes. No pneumothorax, pleural effusion or confluent pulmonary opacity. Calcified aortic atherosclerosis. Acute on chronic pulmonary interstitial opacity. No pleural fluid identified in the fissures on the lateral. Osteopenia.  Paucity of bowel gas in the upper abdomen. IMPRESSION: 1. Chronic cardiomegaly with acute on chronic pulmonary interstitial opacity. No pleural fluid identified. Consider pulmonary interstitial edema and viral/atypical respiratory infection. 2.  Aortic Atherosclerosis (ICD10-I70.0). Electronically Signed   By: Genevie Ann M.D.   On: 03/02/2019 06:57   US Renal  Result Date: 03/02/2019 CLINICAL DATA:  Acute renal failure EXAM: RENAL / URINARY TRACT ULTRASOUND COMPLETE COMPARISON:  Renal ultrasound 07/05/2018 FINDINGS: Right Kidney: Renal measurements: 10.8 x 6.6 x 6.5 cm = volume: 243 mL . Echogenicity within normal limits. No mass or hydronephrosis visualized. Left Kidney: Renal measurements: 10.8 x 6.7 x 6.7 cm = volume: 254 mL. Echogenicity within normal limits. No mass or hydronephrosis visualized. Bladder: Appears normal for degree of bladder distention. IMPRESSION: Negative renal ultrasound. Electronically Signed   By: Franchot Gallo M.D.   On: 03/02/2019 16:50     CBC Recent Labs  Lab 03/02/19 0530 03/03/19 0523  WBC 9.0 6.6  HGB 9.0* 7.9*  HCT 29.0* 25.7*  PLT 268 223  MCV 92.9 93.8  MCH 28.8 28.8  MCHC 31.0 30.7  RDW 14.8 14.6    Chemistries  Recent Labs  Lab 03/02/19 0530 03/03/19 0523 03/04/19 0507 03/05/19 0551  NA 136  136 140 140 134*  K 5.1  5.0 5.0 5.0 5.2*  CL 100  100 103 102 93*  CO2 28  27 30 31 31   GLUCOSE 222*  228* 96 146* 238*  BUN 67*  69* 69* 71* 79*  CREATININE 3.48*  3.54* 3.44* 3.44* 3.81*  CALCIUM 8.9  8.8* 9.1 9.2 8.7*  AST 12*  --   --   --   ALT 8  --   --   --   ALKPHOS 100  --   --   --   BILITOT 0.4  --   --   --    ------------------------------------------------------------------------------------------------------------------ estimated creatinine clearance is 16.7 mL/min (A) (by C-G formula based on SCr of 3.81 mg/dL (H)). ------------------------------------------------------------------------------------------------------------------ No results for input(s): HGBA1C in the last 72 hours. ------------------------------------------------------------------------------------------------------------------ No results for input(s): CHOL, HDL, LDLCALC, TRIG, CHOLHDL, LDLDIRECT in the last 72 hours. ------------------------------------------------------------------------------------------------------------------ No results for input(s): TSH, T4TOTAL, T3FREE, THYROIDAB in the last 72 hours.  Invalid input(s): FREET3 ------------------------------------------------------------------------------------------------------------------ No results for input(s): VITAMINB12, FOLATE, FERRITIN, TIBC, IRON, RETICCTPCT in the last 72 hours.  Coagulation profile No results for input(s): INR, PROTIME in the last 168 hours.  No results for input(s): DDIMER in the last 72 hours.  Cardiac Enzymes Recent Labs  Lab 03/02/19 1239 03/02/19 1544 03/02/19 2211  TROPONINI 0.28* 0.26* 0.31*   ------------------------------------------------------------------------------------------------------------------ Invalid input(s): POCBNP    Assessment & Plan   *Acute on chronic diastolic congestive heart failure Continue IV Lasix drip Appreciate cardiology input  *Acute renal failure on CKD stage III Monitor renal function nephrology  following  *Hyperkalemia Stable  *Elevated troponin Due to demand ischemia  *Diabetes mellitus  Keep on glipizide and Lantus and sliding scale coverage.  *Hyperlipidemia Continue statin.  *Hypertension Blood pressure borderline will adjust antihypertensives     Code Status Orders  (From admission, onward)         Start     Ordered   03/02/19 1211  Full code  Continuous     03/02/19 1210        Code Status History    Date Active Date Inactive Code Status Order ID Comments User Context   08/04/2018 0556 08/07/2018 1951 DNR [id number]  Harrie Foreman, MD Inpatient   07/06/2018 1407 07/14/2018 1603 DNR [id number]  Vaughan Basta, MD Inpatient   07/04/2018 2200 07/06/2018 1407 Full Code [id number]  Demetrios Loll, MD Inpatient   04/22/2018 1702 04/26/2018 1851 DNR [id number]  Max Sane, MD Inpatient   02/21/2017 1629 02/24/2017 1747 DNR [id number]  Fritzi Mandes, MD Inpatient   01/31/2017 1617 02/04/2017 1350 DNR [id number]  Max Sane, MD Inpatient   01/23/2017 1320 01/31/2017 1617 Full Code [id number]  Henreitta Leber, MD Inpatient   10/05/2016 1216 10/26/2016 1740 DNR [id number]  Knox Royalty, NP Inpatient   09/24/2016 1731 10/05/2016 1216 Full Code [id number]  Gladstone Lighter, MD Inpatient   09/01/2016 0217 09/05/2016 1628 Full Code [id number]  Hugel[place]yer, [place], DO Inpatient   08/09/2016 2027 08/12/2016 2015 Full Code [id number]  Henreitta Leber, MD Inpatient   09/30/2015 1423 10/07/2015 1737 Full Code [id number]  Loletha Grayer, MD ED   09/05/2015 1756 09/09/2015 1738 DNR [id number]  Loletha Grayer, MD ED    Advance Directive Documentation     Most Recent Value  Type of Advance Directive  Healthcare Power of Attorney, Living will  Pre-existing out of facility DNR order (yellow form or pink MOST form)  -  "MOST" Form in Place?  -           Consults cardiology  DVT Prophylaxis Heparin  Lab Results  Component Value  Date   PLT 223 03/03/2019     Time Spent  in minutes   35 minutes greater than 50% of time spent in care coordination and counseling patient regarding the condition and plan of care.   Dustin Flock M.D on 03/05/2019 at 11:18 AM  Between 7am to 6pm - Pager - [phone]  After 6pm go to [email or web address] - Proofreader  [employer]   Office  [phone]

## 2019-03-05 NOTE — Progress Notes (Signed)
[place], [place] 03/05/19  Subjective:  Patient seen at bedside. Continues to have low renal function with an EGFR of 11. Her appetite is not at her baseline. Urine output is good at 3.9 L over the preceding 24 hours.   Objective:  Vital signs in last 24 hours:  Temp:  [97.5 F (36.4 C)-98.7 F (37.1 C)] 97.5 F (36.4 C) (05/18 0719) Pulse Rate:  [58-66] 59 (05/18 0719) Resp:  [18-19] 19 (05/18 0719) BP: (134-145)/(47-55) 142/50 (05/18 0719) SpO2:  [92 %-100 %] 92 % (05/18 0719) Weight:  [117.5 kg] 117.5 kg (05/18 0400)  Weight change: -0.4 kg Filed Weights   03/03/19 0500 03/04/19 0411 03/05/19 0400  Weight: 118 kg 117.9 kg 117.5 kg    Intake/Output:    Intake/Output Summary (Last 24 hours) at 03/05/2019 1155 Last data filed at 03/05/2019 1004 Gross per 24 hour  Intake 360 ml  Output 4100 ml  Net -3740 ml     Physical Exam: General:  Obese, no acute distress, laying in the bed  HEENT  [place] O2, moist oral mucous membranes, anicteric  Neck  supple  Pulm/lungs  normal breathing effort, [place] O2  CVS/Heart  soft systolic murmur, no rub  Abdomen:   Soft, obese, nontender  Extremities:  2+ pitting edema  Neurologic:  Alert, able to follow commands  Skin:  No acute rashes    External urinary catheter       Basic Metabolic Panel:  Recent Labs  Lab 03/02/19 0530 03/03/19 0523 03/04/19 0507 03/05/19 0551  NA 136  136 140 140 134*  K 5.1  5.0 5.0 5.0 5.2*  CL 100  100 103 102 93*  CO2 28  27 30 31 31   GLUCOSE 222*  228* 96 146* 238*  BUN 67*  69* 69* 71* 79*  CREATININE 3.48*  3.54* 3.44* 3.44* 3.81*  CALCIUM 8.9  8.8* 9.1 9.2 8.7*  PHOS  --  6.1* 5.7* 6.0*     CBC: Recent Labs  Lab 03/02/19 0530 03/03/19 0523  WBC 9.0 6.6  HGB 9.0* 7.9*  HCT 29.0* 25.7*  MCV 92.9 93.8  PLT 268 223     No results found for: HEPBSAG, HEPBSAB, HEPBIGM    Microbiology:  Recent Results (from the past 240 hour(s))  SARS  Coronavirus 2 (CEPHEID - Performed in [place] hospital lab), Hosp Order     Status: None   Collection Time: 03/02/19  6:35 AM  Result Value Ref Range Status   SARS Coronavirus 2 NEGATIVE NEGATIVE Final    Comment: (NOTE) If result is NEGATIVE SARS-CoV-2 target nucleic acids are NOT DETECTED. The SARS-CoV-2 RNA is generally detectable in upper and lower  respiratory specimens during the acute phase of infection. The lowest  concentration of SARS-CoV-2 viral copies this assay can detect is 250  copies / mL. A negative result does not preclude SARS-CoV-2 infection  and should not be used as the sole basis for treatment or other  patient management decisions.  A negative result may occur with  improper specimen collection / handling, submission of specimen other  than nasopharyngeal swab, presence of viral mutation(s) within the  areas targeted by this assay, and inadequate number of viral copies  (<250 copies / mL). A negative result must be combined with clinical  observations, patient history, and epidemiological information. If result is POSITIVE SARS-CoV-2 target nucleic acids are DETECTED. The SARS-CoV-2 RNA is generally detectable in upper and lower  respiratory specimens dur ing  the acute phase of infection.  Positive  results are indicative of active infection with SARS-CoV-2.  Clinical  correlation with patient history and other diagnostic information is  necessary to determine patient infection status.  Positive results do  not rule out bacterial infection or co-infection with other viruses. If result is PRESUMPTIVE POSTIVE SARS-CoV-2 nucleic acids MAY BE PRESENT.   A presumptive positive result was obtained on the submitted specimen  and confirmed on repeat testing.  While 2019 novel coronavirus  (SARS-CoV-2) nucleic acids may be present in the submitted sample  additional confirmatory testing may be necessary for epidemiological  and / or clinical management purposes  to  differentiate between  SARS-CoV-2 and other Sarbecovirus currently known to infect humans.  If clinically indicated additional testing with an alternate test  methodology [phone]) is advised. The SARS-CoV-2 RNA is generally  detectable in upper and lower respiratory sp ecimens during the acute  phase of infection. The expected result is Negative. Fact Sheet for Patients:  StrictlyIdeas.no Fact Sheet for Healthcare Providers: BankingDealers.co.za This test is not yet approved or cleared by the [place] FDA and has been authorized for detection and/or diagnosis of SARS-CoV-2 by FDA under an Emergency Use Authorization (EUA).  This EUA will remain in effect (meaning this test can be used) for the duration of the COVID-19 declaration under Section 564(b)(1) of the Act, 21 U.S.C. section 360bbb-3(b)(1), unless the authorization is terminated or revoked sooner. Performed at [facility], [address] [place], [place] [address]     Coagulation Studies: No results for input(s): LABPROT, INR in the last 72 hours.  Urinalysis: No results for input(s): COLORURINE, LABSPEC, PHURINE, GLUCOSEU, HGBUR, BILIRUBINUR, KETONESUR, PROTEINUR, UROBILINOGEN, NITRITE, LEUKOCYTESUR in the last 72 hours.  Invalid input(s): APPERANCEUR    Imaging: No results found.   Medications:   . furosemide (LASIX) infusion 10 mg/hr (03/04/19 2357)   . aspirin  81 mg Oral Daily  . atorvastatin  40 mg Oral q1800  . citalopram  10 mg Oral Daily  . docusate sodium  100 mg Oral BID  . feeding supplement (ENSURE ENLIVE)  237 mL Oral BID BM  . fluticasone  2 spray Each Nare Daily  . heparin  5,000 Units Subcutaneous Q8H  . hydrALAZINE  25 mg Oral TID WC  . insulin aspart  0-5 Units Subcutaneous QHS  . insulin aspart  0-9 Units Subcutaneous TID WC  . insulin glargine  10 Units Subcutaneous Daily  . lamoTRIgine  25 mg Oral BID  . levETIRAcetam  250  mg Oral BID  . loratadine  10 mg Oral Daily  . Melatonin  5 mg Oral QHS  . metoprolol tartrate  25 mg Oral BID  . multivitamin with minerals  1 tablet Oral Daily  . nystatin   Topical BID  . OLANZapine  10 mg Oral QHS  . pantoprazole  40 mg Oral BID  . polyethylene glycol  17 g Oral Daily  . senna-docusate  1 tablet Oral QHS  . vitamin B-12  1,000 mcg Oral Daily   acetaminophen, albuterol, clonazePAM, docusate sodium, ondansetron, promethazine  Assessment/ Plan:  78 y.o. Caucasian female with with congestive heart failure , hypertension, diabetes mellitus type II, obstructive sleep apnea, nephrolithiasis   Principal Problem:   Acute on chronic diastolic CHF (congestive heart failure) (HCC) Active Problems:   Acute renal failure superimposed on chronic kidney disease ([place])   #.  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  Last known outpatient creatinine from December 12 is 2.69/GFR 17 Underlying CKD likely secondary to atherosclerosis, diabetes and hypertension -Renal function remains quite low with an EGFR of 11.  We have had some preliminary discussions regarding dialysis with the patient.  She was agreeable yesterday.  Urine output was 3.9 L over the preceding 24 hours.  Therefore no urgent indication for dialysis immediately however we may need to consider this in the relative near future.  Recent Labs    03/02/19 0530 03/03/19 0523 03/04/19 0507 03/05/19 0551  CREATININE 3.48*  3.54* 3.44* 3.44* 3.81*    #. Anemia of CKD  Lab Results  Component Value Date   HGB 7.9 (L) 03/03/2019  Patient follows with Dr. Leron Croak as an outpatient for consideration of erythropoietin stimulating agents.  #.  Hyperphosphatemia  No results found for: PTH Lab Results  Component Value Date   PHOS 6.0 (H) 03/05/2019  Continue to follow phosphorus closely.  We may need to consider starting a phosphorus binder in the relative near future.  #. Diabetes type 2 with  CKD Hemoglobin A1C (%)  Date Value  09/04/2014 6.2   Hgb A1c MFr Bld (%)  Date Value  04/22/2018 7.6 (H)    #Grade 2 diastolic dysfunction with pulmonary hypertension, tricuspid regurgitation, generalized edema Creatinine clearance found to be 16 however creatinine clearance often overestimates renal function.    LOS: 3 Carolyn Valdez 5/18/202011:55 AM  [place], [place]

## 2019-03-05 NOTE — Care Management Important Message (Signed)
Important Message  Patient Details  Name: Carolyn Valdez MRN: [id number] Date of Birth: 02-Aug-1941   Medicare Important Message Given:  Yes    Elza Rafter, RN 03/05/2019, 10:46 AM

## 2019-03-05 NOTE — Progress Notes (Addendum)
Progress Note  Patient Name: Carolyn Valdez Date of Encounter: 03/05/2019  Primary Cardiologist: Kathlyn Sacramento, MD   Subjective   Patient reports she still feels she's not at her baseline. Does not feel her breathing is any better. Denies any chest pain, palpitations, or feeling of racing heart rate. Complains that her lower extremities are tender.   Inpatient Medications    Scheduled Meds: . aspirin  81 mg Oral Daily  . atorvastatin  40 mg Oral q1800  . citalopram  10 mg Oral Daily  . docusate sodium  100 mg Oral BID  . feeding supplement (ENSURE ENLIVE)  237 mL Oral BID BM  . fluticasone  2 spray Each Nare Daily  . heparin  5,000 Units Subcutaneous Q8H  . hydrALAZINE  25 mg Oral TID WC  . insulin aspart  0-5 Units Subcutaneous QHS  . insulin aspart  0-9 Units Subcutaneous TID WC  . insulin glargine  10 Units Subcutaneous Daily  . lamoTRIgine  25 mg Oral BID  . levETIRAcetam  250 mg Oral BID  . loratadine  10 mg Oral Daily  . Melatonin  5 mg Oral QHS  . metoprolol tartrate  25 mg Oral BID  . multivitamin with minerals  1 tablet Oral Daily  . nystatin   Topical BID  . OLANZapine  10 mg Oral QHS  . pantoprazole  40 mg Oral BID  . polyethylene glycol  17 g Oral Daily  . senna-docusate  1 tablet Oral QHS  . vitamin B-12  1,000 mcg Oral Daily   Continuous Infusions: . furosemide (LASIX) infusion 10 mg/hr (03/04/19 2357)   PRN Meds: acetaminophen, albuterol, clonazePAM, docusate sodium, ondansetron, promethazine   Vital Signs    Vitals:   03/04/19 1825 03/04/19 1922 03/05/19 0400 03/05/19 0719  BP: (!) 138/47 (!) 134/48 (!) 145/55 (!) 142/50  Pulse: 66 62 (!) 58 (!) 59  Resp: 18 18 18 19   Temp: 98.6 F (37 C) 98.7 F (37.1 C) 98 F (36.7 C) (!) 97.5 F (36.4 C)  TempSrc: Oral Oral Oral Oral  SpO2: 97% 99% 100% 92%  Weight:   117.5 kg   Height:        Intake/Output Summary (Last 24 hours) at 03/05/2019 1126 Last data filed at 03/05/2019 1004 Gross per 24 hour   Intake 360 ml  Output 4100 ml  Net -3740 ml   Filed Weights   03/03/19 0500 03/04/19 0411 03/05/19 0400  Weight: 118 kg 117.9 kg 117.5 kg    Telemetry    SB, SR, ST HR 50-100bpm, ectopy - Personally Reviewed  ECG    No new tracings - Personally Reviewed  Physical Exam   GEN: No acute distress.   Neck: JVP ~9/10cm Cardiac: RRR, 3/6 systolic murmur, rubs, or gallops.  Respiratory:+bibasilar crackles, heard greater on L side GI: Obese, firm, nontender, non-distended  MS: Tender bilateral lower extremities, Trace bilateral LEE; No deformity. Neuro:  Nonfocal  Psych: Normal affect   Labs    Chemistry Recent Labs  Lab 03/02/19 0530 03/03/19 0523 03/04/19 0507 03/05/19 0551  NA 136  136 140 140 134*  K 5.1  5.0 5.0 5.0 5.2*  CL 100  100 103 102 93*  CO2 28  27 30 31 31   GLUCOSE 222*  228* 96 146* 238*  BUN 67*  69* 69* 71* 79*  CREATININE 3.48*  3.54* 3.44* 3.44* 3.81*  CALCIUM 8.9  8.8* 9.1 9.2 8.7*  PROT 7.2  --   --   --  ALBUMIN 3.5 3.1* 3.1* 3.2*  AST 12*  --   --   --   ALT 8  --   --   --   ALKPHOS 100  --   --   --   BILITOT 0.4  --   --   --   GFRNONAA 12*  12* 12* 12* 11*  GFRAA 14*  14* 14* 14* 13*  ANIONGAP 8  9 7 7 10      Hematology Recent Labs  Lab 03/02/19 0530 03/03/19 0523  WBC 9.0 6.6  RBC 3.12* 2.74*  HGB 9.0* 7.9*  HCT 29.0* 25.7*  MCV 92.9 93.8  MCH 28.8 28.8  MCHC 31.0 30.7  RDW 14.8 14.6  PLT 268 223    Cardiac Enzymes Recent Labs  Lab 03/02/19 0530 03/02/19 1239 03/02/19 1544 03/02/19 2211  TROPONINI 0.31* 0.28* 0.26* 0.31*   No results for input(s): TROPIPOC in the last 168 hours.   BNP Recent Labs  Lab 03/02/19 0530  BNP 867.0*     DDimer No results for input(s): DDIMER in the last 168 hours.   Radiology    No results found.  Cardiac Studies   2D Echo 03/02/2019: 1. The left ventricle has low normal systolic function, with an ejection fraction of 50-55%. The cavity size was mildly  dilated. There is mild concentric left ventricular hypertrophy. Left ventricular diastolic Doppler parameters are consistent with pseudonormalization. 2. The right ventricle has normal systolic function. The cavity was normal. There is no increase in right ventricular wall thickness. Right ventricular systolic pressure is severely elevated with an estimated pressure of 72.7 mmHg. 3. Left atrial size was mildly dilated. 4. Right atrial size was mildly dilated. 5. The mitral valve is grossly normal. There is moderate mitral annular calcification present. Mild mitral valve stenosis. 6. The tricuspid valve is grossly normal. Tricuspid valve regurgitation is moderate. 7. The aortic valve is grossly normal. Aortic valve regurgitation was not assessed by color flow Doppler.  Patient Profile     78 y.o. female with history of HFpEF, PAH, recurrent UTI, GI bleed, hypertension, hyperlipidemia, CKD stage IV-V, and obesity who is being seen today for the evaluation of acute on chronic HFpEF.  Assessment & Plan    Acute on chronic HFpEF/PAH (pulmonary hypertension is due to left heart failure) -She remains somewhat volume overloaded on exam. PASP 85mmHg this admission, increased from 06/2018 PASP 60-65 mmHg. -Strict I's/O, daily standing weights. Current weight 117.5 kg.  112.9 kg 10/2018. No real weight change so far this admission despite diuresis. -6.2L for the admission and -3.8L yesterday.  - Given bump in renal function, recommend decreasing lasix gtt or transitioning to IV bolus injections per nephrology. Diuresis increased to 10 mg/hr on 5/16. - Daily BMET. Cr 3.44  3.81 (baseline Cr 2.69). BUN 71  79.  PTA diuresis was torsemide 40mg  total daily. - Continue medical management. CHF education.  Recommend ambulation and discussed attempting to sit in the chair more often as likely deconditioned. Consider advanced heart failure clinic or pulmonary hypertension clinic in follow-up.   Hyperkalemia -  K 5.2 and trending up. Would continue to monitor and treat a needed to prevent arrhythmia. Would not recommend any potassium supplementation with diuresis.  Elevated troponin -No chest pain.  Troponin mildly elevated and flat trending 0.31  0.26  0.31. Likely supply demand ischemia in the setting of volume overload with CKD stage IV-V and anemia.  Not on heparin drip. - No plan for invasive ischemic work-up  this admission with cardiac catheterization and in the setting of acute on chronic kidney disease with anemia -Echo as above, EF normal - Continue ASA, Lipitor, BB. Bradycardia currently precludes restart of BB. SBP trending up and should continue to monitor at this time. Holding PTA ARB with restart per nephrology.   HTN - Currently limited by bradycardia. Titrate BB as HR allows going forward and with restart of ARB per nephrology.   Acute on chronic kidney disease stage IV-V -Daily BMET.  Bump in creatinine overnight with Lasix GTT.  Continue to hold PTA ARB.  Per nephrology.  Anemia of chronic disease -Daily CBC.  Reportedly missed her last Procrit injection.  Transfuse as needed for Hgb below 8 or sudden drops given h/o GIB.  Chronic respiratory failure with hypoxia - PTA home O2. Continue at discharge.  For questions or updates, please contact [place] Please consult [email or web address] for contact info under        Signed, Arvil Chaco, PA-C  03/05/2019, 11:26 AM

## 2019-03-05 NOTE — Care Management Important Message (Signed)
Important Message  Patient Details  Name: Carolyn Valdez MRN: [id number] Date of Birth: 1941/03/06   Medicare Important Message Given:  Yes    Elza Rafter, RN 03/05/2019, 10:46 AM

## 2019-03-05 NOTE — TOC Progression Note (Addendum)
Transition of Care [facility]) - Progression Note    Patient Details  Name: Carolyn Valdez MRN: [id number] Date of Birth: 04-12-1941  [facility]) CM/SW Contact  Elza Rafter, RN Phone Number: 03/05/2019, 2:17 PM  Clinical Narrative:   Referral made to [facility] for RN, PT and OT at discharge.  Will notify teresa at DC.      Expected Discharge Plan: Assisted Living Barriers to Discharge: Continued Medical Work up  Expected Discharge Plan and Services Expected Discharge Plan: Assisted Living   Discharge Planning Services: CM Consult Post Acute Care Choice: Durable Medical Equipment Living arrangements for the past 2 months: Assisted Living Facility                                       Social Determinants of Health (SDOH) Interventions    Readmission Risk Interventions No flowsheet data found.

## 2019-03-06 LAB — RENAL FUNCTION PANEL
Albumin: 3.2 g/dL — ABNORMAL LOW (ref 3.5–5.0)
Anion gap: 9 (ref 5–15)
BUN: 86 mg/dL — ABNORMAL HIGH (ref 8–23)
CO2: 32 mmol/L (ref 22–32)
Calcium: 9 mg/dL (ref 8.9–10.3)
Chloride: 95 mmol/L — ABNORMAL LOW (ref 98–111)
Creatinine, Ser: 3.51 mg/dL — ABNORMAL HIGH (ref 0.44–1.00)
GFR calc Af Amer: 14 mL/min — ABNORMAL LOW (ref >60)
GFR calc non Af Amer: 12 mL/min — ABNORMAL LOW (ref >60)
Glucose, Bld: 395 mg/dL — ABNORMAL HIGH (ref 70–99)
Phosphorus: 5.1 mg/dL — ABNORMAL HIGH (ref 2.5–4.6)
Potassium: 5.7 mmol/L — ABNORMAL HIGH (ref 3.5–5.1)
Sodium: 136 mmol/L (ref 135–145)

## 2019-03-06 LAB — MRSA PCR SCREENING: MRSA by PCR: NEGATIVE

## 2019-03-06 LAB — PARATHYROID HORMONE, INTACT (NO CA): PTH: 51 pg/mL (ref 15–65)

## 2019-03-06 LAB — GLUCOSE, CAPILLARY
Glucose-Capillary: 231 mg/dL — ABNORMAL HIGH (ref 70–99)
Glucose-Capillary: 238 mg/dL — ABNORMAL HIGH (ref 70–99)
Glucose-Capillary: 316 mg/dL — ABNORMAL HIGH (ref 70–99)
Glucose-Capillary: 341 mg/dL — ABNORMAL HIGH (ref 70–99)
Glucose-Capillary: 344 mg/dL — ABNORMAL HIGH (ref 70–99)

## 2019-03-06 MED ORDER — SODIUM ZIRCONIUM CYCLOSILICATE 10 G PO PACK
10.0000 g | PACK | Freq: Every day | ORAL | Status: AC
  Administered 2019-03-06 – 2019-03-07 (×2): 10 g via ORAL
  Filled 2019-03-06 (×2): qty 1

## 2019-03-06 MED ORDER — SODIUM CHLORIDE 0.9 % IV SOLN
INTRAVENOUS | Status: DC | PRN
  Administered 2019-03-06 – 2019-03-11 (×2): 250 mL via INTRAVENOUS

## 2019-03-06 MED ORDER — ALUM & MAG HYDROXIDE-SIMETH 200-200-20 MG/5ML PO SUSP: 30.0000 mL | Freq: Four times a day (QID) | ORAL | Status: DC | PRN

## 2019-03-06 MED ORDER — INSULIN ASPART 100 UNIT/ML ~~LOC~~ SOLN
7.0000 [IU] | Freq: Three times a day (TID) | SUBCUTANEOUS | Status: DC
  Administered 2019-03-06 – 2019-03-15 (×26): 7 [IU] via SUBCUTANEOUS
  Filled 2019-03-06 (×27): qty 1

## 2019-03-06 MED ORDER — ONDANSETRON HCL 4 MG/2ML IJ SOLN
4.0000 mg | Freq: Four times a day (QID) | INTRAMUSCULAR | Status: AC
  Administered 2019-03-06 – 2019-03-07 (×3): 4 mg via INTRAVENOUS
  Filled 2019-03-06 (×3): qty 2

## 2019-03-06 MED ORDER — INSULIN GLARGINE 100 UNIT/ML ~~LOC~~ SOLN
15.0000 [IU] | Freq: Every day | SUBCUTANEOUS | Status: DC
  Administered 2019-03-06 – 2019-03-07 (×2): 15 [IU] via SUBCUTANEOUS
  Filled 2019-03-06 (×3): qty 0.15

## 2019-03-06 NOTE — Progress Notes (Signed)
Inpatient Diabetes Program Recommendations  AACE/ADA: New Consensus Statement on Inpatient Glycemic Control   Target Ranges:  Prepandial:   less than 140 mg/dL      Peak postprandial:   less than 180 mg/dL (1-2 hours)      Critically ill patients:  140 - 180 mg/dL   Results for [name], Carolyn Valdez (MRN [id number]) as of 03/06/2019 09:37  Ref. Range 03/05/2019 07:21 03/05/2019 11:42 03/05/2019 16:50 03/05/2019 20:59 03/05/2019 23:58 03/06/2019 07:45  Glucose-Capillary Latest Ref Range: 70 - 99 mg/dL 199 (H) 284 (H) 302 (H) 308 (H) 341 (H) 316 (H)   Review of Glycemic Control  Diabetes history: DM2 Outpatient Diabetes medications: Lantus 55 units QHS, Glipizide 5 mg daily, Novolog 0-5 units TID per correction scale Current orders for Inpatient glycemic control: Lantus 10 units daily, Novolog 0-9 units TID with meals, Novolog 0-5 units QHS, Novolog 3 units TID with meals for meal coverage  Inpatient Diabetes Program Recommendations:   Insulin-Basal: Please consider increasing Lantus to 15 units daily.  Insulin - Meal Coverage: Please consider increasing meal coverage to Novolog 7 units TID with meals if patient eats at least 50% of meals.  Thanks, Barnie Alderman, RN, MSN, CDE Diabetes Coordinator Inpatient Diabetes Program [phone] (Team Pager from 8am to 5pm)

## 2019-03-06 NOTE — Progress Notes (Signed)
PT Cancellation Note  Patient Details Name: OPEL LEJEUNE MRN: [id number] DOB: 11/05/1940   Cancelled Treatment:    Reason Eval/Treat Not Completed: Fatigue/lethargy limiting ability to participate;Other (comment)(Pt refuse. reports she just got back into bed from bedside commode. Will attempt again at later time/date. )  Janna Arch, PT, DPT   03/06/2019, 3:58 PM

## 2019-03-06 NOTE — Progress Notes (Signed)
[place] [facility]                                                                                                                                                                                  Patient Demographics   Zyanne Schumm, is a 78 y.o. female, DOB - 17-Feb-1941, HFW:[id number]  Admit date - 03/02/2019   Admitting Physician Vaughan Basta, MD  Outpatient Primary MD for the patient is [facility]   LOS - 4  Subjective: Patient continues to complain of nausea but no vomiting Shortness of breath is improved  Review of Systems:   CONSTITUTIONAL: No documented fever. No fatigue, weakness. No weight gain, no weight loss.  EYES: No blurry or double vision.  ENT: No tinnitus. No postnasal drip. No redness of the oropharynx.  RESPIRATORY: No cough, no wheeze, no hemoptysis.  Positive dyspnea.  CARDIOVASCULAR: No chest pain. No orthopnea. No palpitations. No syncope.  GASTROINTESTINAL: Positive nausea, no vomiting or diarrhea. No abdominal pain. No melena or hematochezia.  GENITOURINARY: No dysuria or hematuria.  ENDOCRINE: No polyuria or nocturia. No heat or cold intolerance.  HEMATOLOGY: No anemia. No bruising. No bleeding.  INTEGUMENTARY: No rashes. No lesions.  MUSCULOSKELETAL: No arthritis. No swelling. No gout.  Positive back pain NEUROLOGIC: No numbness, tingling, or ataxia. No seizure-type activity.  PSYCHIATRIC: No anxiety. No insomnia. No ADD.    Vitals:   Vitals:   03/05/19 2001 03/06/19 0304 03/06/19 0744 03/06/19 1201  BP: (!) 135/48 (!) 150/50 (!) 131/47 (!) 128/43  Pulse: 63 67 62 61  Resp: 20 16 20    Temp: 98 F (36.7 C) 98.4 F (36.9 C) 98.7 F (37.1 C)   TempSrc: Oral Oral Oral   SpO2: 98% 95% 98% 96%  Weight:  116.9 kg    Height:        Wt Readings from Last 3 Encounters:  03/06/19 116.9 kg  10/23/18 112.9 kg  09/28/18 113.2 kg     Intake/Output Summary (Last 24 hours) at 03/06/2019 1221 Last  data filed at 03/06/2019 0277 Gross per 24 hour  Intake 1156.85 ml  Output 1050 ml  Net 106.85 ml    Physical Exam:   GENERAL: Pleasant-appearing in no apparent distress.  HEAD, EYES, EARS, NOSE AND THROAT: Atraumatic, normocephalic. Extraocular muscles are intact. Pupils equal and reactive to light. Sclerae anicteric. No conjunctival injection. No oro-pharyngeal erythema.  NECK: Supple. There is no jugular venous distention. No bruits, no lymphadenopathy, no thyromegaly.  HEART: Regular rate and rhythm,. No murmurs, no rubs, no clicks.  LUNGS: Bilateral crackles throughout both lung.  ABDOMEN: Soft, flat, nontender, nondistended. Has good bowel sounds. No hepatosplenomegaly  appreciated.  EXTREMITIES: No evidence of any cyanosis, clubbing, or peripheral edema.  +2 pedal and radial pulses bilaterally.  NEUROLOGIC: The patient is alert, awake, and oriented x3 with no focal motor or sensory deficits appreciated bilaterally.  SKIN: Moist and warm with no rashes appreciated.  Psych: Not anxious, depressed LN: No inguinal LN enlargement    Antibiotics   Anti-infectives (From admission, onward)   None      Medications   Scheduled Meds: . aspirin  81 mg Oral Daily  . atorvastatin  40 mg Oral q1800  . citalopram  10 mg Oral Daily  . docusate sodium  100 mg Oral BID  . feeding supplement (ENSURE ENLIVE)  237 mL Oral BID BM  . fluticasone  2 spray Each Nare Daily  . heparin  5,000 Units Subcutaneous Q8H  . hydrALAZINE  25 mg Oral TID WC  . insulin aspart  0-5 Units Subcutaneous QHS  . insulin aspart  0-9 Units Subcutaneous TID WC  . insulin aspart  7 Units Subcutaneous TID WC  . insulin glargine  15 Units Subcutaneous Daily  . lamoTRIgine  25 mg Oral BID  . levETIRAcetam  250 mg Oral BID  . loratadine  10 mg Oral Daily  . Melatonin  5 mg Oral QHS  . metoprolol tartrate  25 mg Oral BID  . multivitamin with minerals  1 tablet Oral Daily  . nystatin   Topical BID  . OLANZapine  10  mg Oral QHS  . ondansetron (ZOFRAN) IV  4 mg Intravenous Q6H  . pantoprazole  40 mg Oral BID  . polyethylene glycol  17 g Oral Daily  . senna-docusate  1 tablet Oral QHS  . sodium chloride flush  3 mL Intravenous Q12H  . vitamin B-12  1,000 mcg Oral Daily   Continuous Infusions: . furosemide (LASIX) infusion 6 mg/hr (03/06/19 1017)   PRN Meds:.acetaminophen, albuterol, alum & mag hydroxide-simeth, clonazePAM, docusate sodium, ondansetron, promethazine   Data Review:   Micro Results Recent Results (from the past 240 hour(s))  SARS Coronavirus 2 (CEPHEID - Performed in [place] hospital lab), Hosp Order     Status: None   Collection Time: 03/02/19  6:35 AM  Result Value Ref Range Status   SARS Coronavirus 2 NEGATIVE NEGATIVE Final    Comment: (NOTE) If result is NEGATIVE SARS-CoV-2 target nucleic acids are NOT DETECTED. The SARS-CoV-2 RNA is generally detectable in upper and lower  respiratory specimens during the acute phase of infection. The lowest  concentration of SARS-CoV-2 viral copies this assay can detect is 250  copies / mL. A negative result does not preclude SARS-CoV-2 infection  and should not be used as the sole basis for treatment or other  patient management decisions.  A negative result may occur with  improper specimen collection / handling, submission of specimen other  than nasopharyngeal swab, presence of viral mutation(s) within the  areas targeted by this assay, and inadequate number of viral copies  (<250 copies / mL). A negative result must be combined with clinical  observations, patient history, and epidemiological information. If result is POSITIVE SARS-CoV-2 target nucleic acids are DETECTED. The SARS-CoV-2 RNA is generally detectable in upper and lower  respiratory specimens dur ing the acute phase of infection.  Positive  results are indicative of active infection with SARS-CoV-2.  Clinical  correlation with patient history and other diagnostic  information is  necessary to determine patient infection status.  Positive results do  not rule out bacterial  infection or co-infection with other viruses. If result is PRESUMPTIVE POSTIVE SARS-CoV-2 nucleic acids MAY BE PRESENT.   A presumptive positive result was obtained on the submitted specimen  and confirmed on repeat testing.  While 2019 novel coronavirus  (SARS-CoV-2) nucleic acids may be present in the submitted sample  additional confirmatory testing may be necessary for epidemiological  and / or clinical management purposes  to differentiate between  SARS-CoV-2 and other Sarbecovirus currently known to infect humans.  If clinically indicated additional testing with an alternate test  methodology [phone]) is advised. The SARS-CoV-2 RNA is generally  detectable in upper and lower respiratory sp ecimens during the acute  phase of infection. The expected result is Negative. Fact Sheet for Patients:  StrictlyIdeas.no Fact Sheet for Healthcare Providers: BankingDealers.co.za This test is not yet approved or cleared by the [place] FDA and has been authorized for detection and/or diagnosis of SARS-CoV-2 by FDA under an Emergency Use Authorization (EUA).  This EUA will remain in effect (meaning this test can be used) for the duration of the COVID-19 declaration under Section 564(b)(1) of the Act, 21 U.S.C. section 360bbb-3(b)(1), unless the authorization is terminated or revoked sooner. Performed at [facility], [address] [place], [place] [address]     Radiology Reports Dg Chest 2 View  Result Date: 03/03/2019 CLINICAL DATA:  78 year old female with a known history of diastolic congestive heart failure. EXAM: CHEST - 2 VIEW COMPARISON:  Mar 02, 2019 FINDINGS: Stable cardiomegaly. The hila and mediastinum are normal. No pulmonary nodules, masses, focal infiltrates, or overt edema. IMPRESSION: Cardiomegaly.  No  acute abnormalities. Electronically Signed   By: Dorise Bullion III M.D   On: 03/03/2019 08:42   Dg Chest 2 View  Result Date: 03/02/2019 CLINICAL DATA:  78 year old female with shortness of breath for 2 days. Former smoker. EXAM: CHEST - 2 VIEW COMPARISON:  Portable chest 08/03/2018 and earlier. FINDINGS: Seated AP and lateral views of the chest. Stable cardiomegaly and mediastinal contours. Visualized tracheal air column is within normal limits. Stable lung volumes. No pneumothorax, pleural effusion or confluent pulmonary opacity. Calcified aortic atherosclerosis. Acute on chronic pulmonary interstitial opacity. No pleural fluid identified in the fissures on the lateral. Osteopenia.  Paucity of bowel gas in the upper abdomen. IMPRESSION: 1. Chronic cardiomegaly with acute on chronic pulmonary interstitial opacity. No pleural fluid identified. Consider pulmonary interstitial edema and viral/atypical respiratory infection. 2.  Aortic Atherosclerosis (ICD10-I70.0). Electronically Signed   By: Genevie Ann M.D.   On: 03/02/2019 06:57   US Renal  Result Date: 03/02/2019 CLINICAL DATA:  Acute renal failure EXAM: RENAL / URINARY TRACT ULTRASOUND COMPLETE COMPARISON:  Renal ultrasound 07/05/2018 FINDINGS: Right Kidney: Renal measurements: 10.8 x 6.6 x 6.5 cm = volume: 243 mL . Echogenicity within normal limits. No mass or hydronephrosis visualized. Left Kidney: Renal measurements: 10.8 x 6.7 x 6.7 cm = volume: 254 mL. Echogenicity within normal limits. No mass or hydronephrosis visualized. Bladder: Appears normal for degree of bladder distention. IMPRESSION: Negative renal ultrasound. Electronically Signed   By: Franchot Gallo M.D.   On: 03/02/2019 16:50     CBC Recent Labs  Lab 03/02/19 0530 03/03/19 0523  WBC 9.0 6.6  HGB 9.0* 7.9*  HCT 29.0* 25.7*  PLT 268 223  MCV 92.9 93.8  MCH 28.8 28.8  MCHC 31.0 30.7  RDW 14.8 14.6    Chemistries  Recent Labs  Lab 03/02/19 0530 03/03/19 0523  03/04/19 0507 03/05/19 0551 03/06/19 0458  NA  136  136 140 140 134* 136  K 5.1  5.0 5.0 5.0 5.2* 5.7*  CL 100  100 103 102 93* 95*  CO2 28  27 30 31 31  32  GLUCOSE 222*  228* 96 146* 238* 395*  BUN 67*  69* 69* 71* 79* 86*  CREATININE 3.48*  3.54* 3.44* 3.44* 3.81* 3.51*  CALCIUM 8.9  8.8* 9.1 9.2 8.7* 9.0  AST 12*  --   --   --   --   ALT 8  --   --   --   --   ALKPHOS 100  --   --   --   --   BILITOT 0.4  --   --   --   --    ------------------------------------------------------------------------------------------------------------------ estimated creatinine clearance is 18 mL/min (A) (by C-G formula based on SCr of 3.51 mg/dL (H)). ------------------------------------------------------------------------------------------------------------------ No results for input(s): HGBA1C in the last 72 hours. ------------------------------------------------------------------------------------------------------------------ No results for input(s): CHOL, HDL, LDLCALC, TRIG, CHOLHDL, LDLDIRECT in the last 72 hours. ------------------------------------------------------------------------------------------------------------------ No results for input(s): TSH, T4TOTAL, T3FREE, THYROIDAB in the last 72 hours.  Invalid input(s): FREET3 ------------------------------------------------------------------------------------------------------------------ No results for input(s): VITAMINB12, FOLATE, FERRITIN, TIBC, IRON, RETICCTPCT in the last 72 hours.  Coagulation profile No results for input(s): INR, PROTIME in the last 168 hours.  No results for input(s): DDIMER in the last 72 hours.  Cardiac Enzymes Recent Labs  Lab 03/02/19 1239 03/02/19 1544 03/02/19 2211  TROPONINI 0.28* 0.26* 0.31*   ------------------------------------------------------------------------------------------------------------------ Invalid input(s): POCBNP    Assessment & Plan   *Acute on chronic diastolic  congestive heart failure Per nephrology IV Lasix will be decreased Appreciate cardiology input  *Acute renal failure on CKD stage III Monitor renal function nephrology following  *Hyperkalemia Stable  *Elevated troponin Due to demand ischemia  *Diabetes mellitus  Blood sugars elevated will adjust her insulin.  *Hyperlipidemia Continue statin.  *Hypertension Blood pressure borderline will adjust antihypertensives     Code Status Orders  (From admission, onward)         Start     Ordered   03/02/19 1211  Full code  Continuous     03/02/19 1210        Code Status History    Date Active Date Inactive Code Status Order ID Comments User Context   08/04/2018 0556 08/07/2018 1951 DNR [id number]  Harrie Foreman, MD Inpatient   07/06/2018 1407 07/14/2018 1603 DNR [id number]  Vaughan Basta, MD Inpatient   07/04/2018 2200 07/06/2018 1407 Full Code [id number]  Demetrios Loll, MD Inpatient   04/22/2018 1702 04/26/2018 1851 DNR [id number]  Max Sane, MD Inpatient   02/21/2017 1629 02/24/2017 1747 DNR [id number]  Fritzi Mandes, MD Inpatient   01/31/2017 1617 02/04/2017 1350 DNR [id number]  Max Sane, MD Inpatient   01/23/2017 1320 01/31/2017 1617 Full Code [id number]  Henreitta Leber, MD Inpatient   10/05/2016 1216 10/26/2016 1740 DNR [id number]  Knox Royalty, NP Inpatient   09/24/2016 1731 10/05/2016 1216 Full Code [id number]  Gladstone Lighter, MD Inpatient   09/01/2016 0217 09/05/2016 1628 Full Code [id number]  Hugelmeyer, Lely, DO Inpatient   08/09/2016 2027 08/12/2016 2015 Full Code [id number]  Henreitta Leber, MD Inpatient   09/30/2015 1423 10/07/2015 1737 Full Code [id number]  Loletha Grayer, MD ED   09/05/2015 1756 09/09/2015 1738 DNR [id number]  Loletha Grayer, MD ED    Advance Directive Documentation     Most Recent Value  Type of Advance Directive  Healthcare Power of [place], Living will  Pre-existing out of facility DNR order (yellow form or pink MOST form)  -  "MOST"  Form in Place?  -           Consults cardiology  DVT Prophylaxis Heparin  Lab Results  Component Value Date   PLT 223 03/03/2019     Time Spent in minutes   35 minutes greater than 50% of time spent in care coordination and counseling patient regarding the condition and plan of care.   Dustin Flock M.D on 03/06/2019 at 12:21 PM  Between 7am to 6pm - Pager - [phone]  After 6pm go to [email or web address] - Proofreader  [employer]   Office  [phone]

## 2019-03-06 NOTE — Plan of Care (Signed)
Nausea reported this shift, however no episodes of vomiting. Patient on scheduled zofran, appetite appears to have improved. Continues on lasix gtt, no c/o pain.   Patient feels weak and has been drowsy, needs encouragement for activity.   Problem: Clinical Measurements: Goal: Ability to maintain clinical measurements within normal limits will improve Outcome: Progressing   Problem: Nutrition: Goal: Adequate nutrition will be maintained Outcome: Progressing   Problem: Elimination: Goal: Will not experience complications related to bowel motility Outcome: Progressing Goal: Will not experience complications related to urinary retention Outcome: Progressing   Problem: Pain Managment: Goal: General experience of comfort will improve Outcome: Progressing   Problem: Safety: Goal: Ability to remain free from injury will improve Outcome: Progressing

## 2019-03-06 NOTE — Progress Notes (Signed)
[facility], [place] 03/06/19  Subjective:  Patient seen at bedside. Was on high-dose furosemide. Dose decreased to 6 mg/h. Creatinine currently 3.51.  Objective:  Vital signs in last 24 hours:  Temp:  [98 F (36.7 C)-98.7 F (37.1 C)] 98.7 F (37.1 C) (05/19 0744) Pulse Rate:  [61-67] 61 (05/19 1201) Resp:  [16-20] 20 (05/19 0744) BP: (128-150)/(43-50) 128/43 (05/19 1201) SpO2:  [95 %-98 %] 96 % (05/19 1201) Weight:  [116.9 kg] 116.9 kg (05/19 0304)  Weight change: -0.563 kg Filed Weights   03/04/19 0411 03/05/19 0400 03/06/19 0304  Weight: 117.9 kg 117.5 kg 116.9 kg    Intake/Output:    Intake/Output Summary (Last 24 hours) at 03/06/2019 1335 Last data filed at 03/06/2019 5638 Gross per 24 hour  Intake 1156.85 ml  Output 1050 ml  Net 106.85 ml     Physical Exam: General:  Obese, no acute distress, laying in the bed  HEENT  [place] O2, moist oral mucous membranes, anicteric  Neck  supple  Pulm/lungs  normal breathing effort, [place] O2  CVS/Heart  soft systolic murmur, V5I4  Abdomen:   Soft, obese, nontender  Extremities:  1+ pitting edema  Neurologic:  Alert, able to follow commands  Skin:  No acute rashes    External urinary catheter       Basic Metabolic Panel:  Recent Labs  Lab 03/02/19 0530 03/03/19 0523 03/04/19 0507 03/05/19 0551 03/06/19 0458  NA 136  136 140 140 134* 136  K 5.1  5.0 5.0 5.0 5.2* 5.7*  CL 100  100 103 102 93* 95*  CO2 28  27 30 31 31  32  GLUCOSE 222*  228* 96 146* 238* 395*  BUN 67*  69* 69* 71* 79* 86*  CREATININE 3.48*  3.54* 3.44* 3.44* 3.81* 3.51*  CALCIUM 8.9  8.8* 9.1 9.2 8.7* 9.0  PHOS  --  6.1* 5.7* 6.0* 5.1*     CBC: Recent Labs  Lab 03/02/19 0530 03/03/19 0523  WBC 9.0 6.6  HGB 9.0* 7.9*  HCT 29.0* 25.7*  MCV 92.9 93.8  PLT 268 223     No results found for: HEPBSAG, HEPBSAB, HEPBIGM    Microbiology:  Recent Results (from the past 240 hour(s))  SARS Coronavirus 2  (CEPHEID - Performed in [place] hospital lab), Hosp Order     Status: None   Collection Time: 03/02/19  6:35 AM  Result Value Ref Range Status   SARS Coronavirus 2 NEGATIVE NEGATIVE Final    Comment: (NOTE) If result is NEGATIVE SARS-CoV-2 target nucleic acids are NOT DETECTED. The SARS-CoV-2 RNA is generally detectable in upper and lower  respiratory specimens during the acute phase of infection. The lowest  concentration of SARS-CoV-2 viral copies this assay can detect is 250  copies / mL. A negative result does not preclude SARS-CoV-2 infection  and should not be used as the sole basis for treatment or other  patient management decisions.  A negative result may occur with  improper specimen collection / handling, submission of specimen other  than nasopharyngeal swab, presence of viral mutation(s) within the  areas targeted by this assay, and inadequate number of viral copies  (<250 copies / mL). A negative result must be combined with clinical  observations, patient history, and epidemiological information. If result is POSITIVE SARS-CoV-2 target nucleic acids are DETECTED. The SARS-CoV-2 RNA is generally detectable in upper and lower  respiratory specimens dur ing the acute phase of infection.  Positive  results  are indicative of active infection with SARS-CoV-2.  Clinical  correlation with patient history and other diagnostic information is  necessary to determine patient infection status.  Positive results do  not rule out bacterial infection or co-infection with other viruses. If result is PRESUMPTIVE POSTIVE SARS-CoV-2 nucleic acids MAY BE PRESENT.   A presumptive positive result was obtained on the submitted specimen  and confirmed on repeat testing.  While 2019 novel coronavirus  (SARS-CoV-2) nucleic acids may be present in the submitted sample  additional confirmatory testing may be necessary for epidemiological  and / or clinical management purposes  to differentiate  between  SARS-CoV-2 and other Sarbecovirus currently known to infect humans.  If clinically indicated additional testing with an alternate test  methodology [phone]) is advised. The SARS-CoV-2 RNA is generally  detectable in upper and lower respiratory sp ecimens during the acute  phase of infection. The expected result is Negative. Fact Sheet for Patients:  StrictlyIdeas.no Fact Sheet for Healthcare Providers: BankingDealers.co.za This test is not yet approved or cleared by the [place] FDA and has been authorized for detection and/or diagnosis of SARS-CoV-2 by FDA under an Emergency Use Authorization (EUA).  This EUA will remain in effect (meaning this test can be used) for the duration of the COVID-19 declaration under Section 564(b)(1) of the Act, 21 U.S.C. section 360bbb-3(b)(1), unless the authorization is terminated or revoked sooner. Performed at [facility], [address] [place], [place] [address]     Coagulation Studies: No results for input(s): LABPROT, INR in the last 72 hours.  Urinalysis: No results for input(s): COLORURINE, LABSPEC, PHURINE, GLUCOSEU, HGBUR, BILIRUBINUR, KETONESUR, PROTEINUR, UROBILINOGEN, NITRITE, LEUKOCYTESUR in the last 72 hours.  Invalid input(s): APPERANCEUR    Imaging: No results found.   Medications:   . furosemide (LASIX) infusion 6 mg/hr (03/06/19 1017)   . aspirin  81 mg Oral Daily  . atorvastatin  40 mg Oral q1800  . citalopram  10 mg Oral Daily  . docusate sodium  100 mg Oral BID  . feeding supplement (ENSURE ENLIVE)  237 mL Oral BID BM  . fluticasone  2 spray Each Nare Daily  . heparin  5,000 Units Subcutaneous Q8H  . hydrALAZINE  25 mg Oral TID WC  . insulin aspart  0-5 Units Subcutaneous QHS  . insulin aspart  0-9 Units Subcutaneous TID WC  . insulin aspart  7 Units Subcutaneous TID WC  . insulin glargine  15 Units Subcutaneous Daily  . lamoTRIgine  25  mg Oral BID  . levETIRAcetam  250 mg Oral BID  . loratadine  10 mg Oral Daily  . Melatonin  5 mg Oral QHS  . metoprolol tartrate  25 mg Oral BID  . multivitamin with minerals  1 tablet Oral Daily  . nystatin   Topical BID  . OLANZapine  10 mg Oral QHS  . ondansetron (ZOFRAN) IV  4 mg Intravenous Q6H  . pantoprazole  40 mg Oral BID  . polyethylene glycol  17 g Oral Daily  . senna-docusate  1 tablet Oral QHS  . sodium chloride flush  3 mL Intravenous Q12H  . vitamin B-12  1,000 mcg Oral Daily   acetaminophen, albuterol, alum & mag hydroxide-simeth, clonazePAM, docusate sodium, ondansetron, promethazine  Assessment/ Plan:  78 y.o. Caucasian female with with congestive heart failure , hypertension, diabetes mellitus type II, obstructive sleep apnea, nephrolithiasis   Principal Problem:   Acute on chronic diastolic CHF (congestive heart failure) (HCC) Active Problems:  Acute renal failure superimposed on chronic kidney disease ([place])   #.  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   Last known outpatient creatinine from December 12 is 2.69/GFR 17 Underlying CKD likely secondary to atherosclerosis, diabetes and hypertension -Patient continues to have overall diminished renal function.  Most recent GFR was 12 with a creatinine that was slightly down to 3.51.  Continue Lasix drip for now however we had reduced the dose to 6 mg/h.  Recent Labs    03/03/19 0523 03/04/19 0507 03/05/19 0551 03/06/19 0458  CREATININE 3.44* 3.44* 3.81* 3.51*    #. Anemia of CKD  Lab Results  Component Value Date   HGB 7.9 (L) 03/03/2019  Patient follows with Dr. Leron Croak as an outpatient for consideration of erythropoietin stimulating agents.  #.  Hyperphosphatemia     Component Value Date/Time   PTH 51 03/04/2019 0507   Lab Results  Component Value Date   PHOS 5.1 (H) 03/06/2019  Phosphorus a bit high at 5.1.  However this is lower than yesterday.  Continue to monitor.  #.  Diabetes type 2 with CKD Hemoglobin A1C (%)  Date Value  09/04/2014 6.2   Hgb A1c MFr Bld (%)  Date Value  04/22/2018 7.6 (H)    #Grade 2 diastolic dysfunction with pulmonary hypertension, tricuspid regurgitation, generalized edema Creatinine clearance found to be 16 however creatinine clearance often overestimates renal function.  Hyperkalemia. Potassium high at 5.7.  Start the patient on Lokelma 10 g daily.    LOS: 4 Carolyn Valdez 5/19/20201:35 PM  [place], [place]

## 2019-03-06 NOTE — Progress Notes (Signed)
Progress Note  Patient Name: Carolyn Valdez Date of Encounter: 03/06/2019  Primary Cardiologist: Kathlyn Sacramento, MD   Subjective   Patient reports improved breathing and less LEE today. She still reports tenderness of her abdomen and both legs. She denies any chest pain, palpitations, or feeling of racing HR.   Inpatient Medications    Scheduled Meds: . aspirin  81 mg Oral Daily  . atorvastatin  40 mg Oral q1800  . citalopram  10 mg Oral Daily  . docusate sodium  100 mg Oral BID  . feeding supplement (ENSURE ENLIVE)  237 mL Oral BID BM  . fluticasone  2 spray Each Nare Daily  . heparin  5,000 Units Subcutaneous Q8H  . hydrALAZINE  25 mg Oral TID WC  . insulin aspart  0-5 Units Subcutaneous QHS  . insulin aspart  0-9 Units Subcutaneous TID WC  . insulin aspart  7 Units Subcutaneous TID WC  . insulin glargine  15 Units Subcutaneous Daily  . lamoTRIgine  25 mg Oral BID  . levETIRAcetam  250 mg Oral BID  . loratadine  10 mg Oral Daily  . Melatonin  5 mg Oral QHS  . metoprolol tartrate  25 mg Oral BID  . multivitamin with minerals  1 tablet Oral Daily  . nystatin   Topical BID  . OLANZapine  10 mg Oral QHS  . ondansetron (ZOFRAN) IV  4 mg Intravenous Q6H  . pantoprazole  40 mg Oral BID  . polyethylene glycol  17 g Oral Daily  . senna-docusate  1 tablet Oral QHS  . sodium chloride flush  3 mL Intravenous Q12H  . vitamin B-12  1,000 mcg Oral Daily   Continuous Infusions: . furosemide (LASIX) infusion 6 mg/hr (03/06/19 1017)   PRN Meds: acetaminophen, albuterol, alum & mag hydroxide-simeth, clonazePAM, docusate sodium, ondansetron, promethazine   Vital Signs    Vitals:   03/05/19 1531 03/05/19 2001 03/06/19 0304 03/06/19 0744  BP: (!) 141/46 (!) 135/48 (!) 150/50 (!) 131/47  Pulse: 64 63 67 62  Resp: 19 20 16 20   Temp: 98 F (36.7 C) 98 F (36.7 C) 98.4 F (36.9 C) 98.7 F (37.1 C)  TempSrc: Oral Oral Oral Oral  SpO2: 97% 98% 95% 98%  Weight:   116.9 kg   Height:         Intake/Output Summary (Last 24 hours) at 03/06/2019 1141 Last data filed at 03/06/2019 4503 Gross per 24 hour  Intake 1156.85 ml  Output 1050 ml  Net 106.85 ml   Filed Weights   03/04/19 0411 03/05/19 0400 03/06/19 0304  Weight: 117.9 kg 117.5 kg 116.9 kg    Telemetry    SR - Personally Reviewed  ECG    No new tracings - Personally Reviewed  Physical Exam   GEN: No acute distress.   Neck: JVP 11-12cm. + HJR.  Cardiac: RRR, 2/6 systolic murmur, rubs, or gallops.  Respiratory: Distant breath sounds  GI: Obese, firm, nontender, non-distended  MS: Tender bilateral lower extremities, Trace bilateral LEE; No deformity. Neuro:  Nonfocal  Psych: Normal affect   Labs    Chemistry Recent Labs  Lab 03/02/19 0530  03/04/19 0507 03/05/19 0551 03/06/19 0458  NA 136  136   < > 140 134* 136  K 5.1  5.0   < > 5.0 5.2* 5.7*  CL 100  100   < > 102 93* 95*  CO2 28  27   < > 31 31 32  GLUCOSE  222*  228*   < > 146* 238* 395*  BUN 67*  69*   < > 71* 79* 86*  CREATININE 3.48*  3.54*   < > 3.44* 3.81* 3.51*  CALCIUM 8.9  8.8*   < > 9.2 8.7* 9.0  PROT 7.2  --   --   --   --   ALBUMIN 3.5   < > 3.1* 3.2* 3.2*  AST 12*  --   --   --   --   ALT 8  --   --   --   --   ALKPHOS 100  --   --   --   --   BILITOT 0.4  --   --   --   --   GFRNONAA 12*  12*   < > 12* 11* 12*  GFRAA 14*  14*   < > 14* 13* 14*  ANIONGAP 8  9   < > 7 10 9    < > = values in this interval not displayed.     Hematology Recent Labs  Lab 03/02/19 0530 03/03/19 0523  WBC 9.0 6.6  RBC 3.12* 2.74*  HGB 9.0* 7.9*  HCT 29.0* 25.7*  MCV 92.9 93.8  MCH 28.8 28.8  MCHC 31.0 30.7  RDW 14.8 14.6  PLT 268 223    Cardiac Enzymes Recent Labs  Lab 03/02/19 0530 03/02/19 1239 03/02/19 1544 03/02/19 2211  TROPONINI 0.31* 0.28* 0.26* 0.31*   No results for input(s): TROPIPOC in the last 168 hours.   BNP Recent Labs  Lab 03/02/19 0530  BNP 867.0*     DDimer No results for input(s):  DDIMER in the last 168 hours.   Radiology    No results found.  Cardiac Studies   2D Echo 03/02/2019: 1. The left ventricle has low normal systolic function, with an ejection fraction of 50-55%. The cavity size was mildly dilated. There is mild concentric left ventricular hypertrophy. Left ventricular diastolic Doppler parameters are consistent with pseudonormalization. 2. The right ventricle has normal systolic function. The cavity was normal. There is no increase in right ventricular wall thickness. Right ventricular systolic pressure is severely elevated with an estimated pressure of 72.7 mmHg. 3. Left atrial size was mildly dilated. 4. Right atrial size was mildly dilated. 5. The mitral valve is grossly normal. There is moderate mitral annular calcification present. Mild mitral valve stenosis. 6. The tricuspid valve is grossly normal. Tricuspid valve regurgitation is moderate. 7. The aortic valve is grossly normal. Aortic valve regurgitation was not assessed by color flow Doppler.  Patient Profile     78 y.o. female with history of HFpEF, PAH, recurrent UTI, GI bleed, hypertension, hyperlipidemia, CKD stage IV-V, and obesity who is being seen today for the evaluation of acute on chronic HFpEF.  Assessment & Plan    Acute on chronic HFpEF/PAH -Nearing baseline: improved SOB and relatively euvolemic on exam. Severe PAH with PASP 62mmHg this admission, increased from 06/2018 PASP 60-65 mmHg. -Strict I's/O, daily standing weights. Weight 117.5 kg  116.9kg with previous wt 112.9 kg on 10/2018.  -6L for the admission, -2L yesterday.  - Remains on lasix drip with further titration per nephrology. Transition to oral diuresis before discharge and possibly at increased dose (PTA torsemide 40mg  daily). Daily BMET. Cr 3.81   3.51 (baseline Cr 2.69). BUN 71  86.  PTA diuresis was torsemide 40mg  total daily. - Continue medical management. Risk factor modification. CHF education. Ambulation as  tolerated. Consider advanced heart  failure clinic or pulmonary hypertension clinic in follow-up.   Hyperkalemia - K 5.2  5.7, trending up. Monitor.  Elevated troponin -No chest pain.  Troponin mildly elevated and flat trending 0.31  0.26  0.31. Likely supply demand ischemia in the setting of volume overload with CKD stage IV-V and anemia.   - No plan for invasive ischemic work-up this admission in the setting of acute on chronic kidney disease with anemia. -Echo as above, EF normal. - Continue ASA, Lipitor, BB. Titrate BB for BP control as HR allows. Holding PTA ARB 2/2 renal function at admission - restart per nephrology.   HTN - Titrate BB as HR allows. Restart of ARB per nephrology.   Acute on chronic kidney disease stage IV-V -Daily BMET.  Per nephrology with possible HD in future per review of last note.  Anemia of chronic disease -Daily CBC.  Transfuse as needed for Hgb below 8 or sudden drops given h/o GIB.  Chronic respiratory failure with hypoxia - PTA home O2. Continue at discharge.  For questions or updates, please contact [place] Please consult [email or web address] for contact info under        Signed, Arvil Chaco, PA-C  03/06/2019, 11:41 AM

## 2019-03-07 LAB — GLUCOSE, CAPILLARY
Glucose-Capillary: 196 mg/dL — ABNORMAL HIGH (ref 70–99)
Glucose-Capillary: 253 mg/dL — ABNORMAL HIGH (ref 70–99)
Glucose-Capillary: 243 mg/dL — ABNORMAL HIGH (ref 70–99)
Glucose-Capillary: 243 mg/dL — ABNORMAL HIGH (ref 70–99)

## 2019-03-07 LAB — RENAL FUNCTION PANEL
Albumin: 3.1 g/dL — ABNORMAL LOW (ref 3.5–5.0)
Anion gap: 10 (ref 5–15)
BUN: 85 mg/dL — ABNORMAL HIGH (ref 8–23)
CO2: 33 mmol/L — ABNORMAL HIGH (ref 22–32)
Calcium: 9.1 mg/dL (ref 8.9–10.3)
Chloride: 91 mmol/L — ABNORMAL LOW (ref 98–111)
Creatinine, Ser: 3.55 mg/dL — ABNORMAL HIGH (ref 0.44–1.00)
GFR calc Af Amer: 14 mL/min — ABNORMAL LOW (ref >60)
GFR calc non Af Amer: 12 mL/min — ABNORMAL LOW (ref >60)
Glucose, Bld: 166 mg/dL — ABNORMAL HIGH (ref 70–99)
Phosphorus: 4.7 mg/dL — ABNORMAL HIGH (ref 2.5–4.6)
Potassium: 4.7 mmol/L (ref 3.5–5.1)
Sodium: 134 mmol/L — ABNORMAL LOW (ref 135–145)

## 2019-03-07 NOTE — Progress Notes (Signed)
Inpatient Diabetes Program Recommendations  AACE/ADA: New Consensus Statement on Inpatient Glycemic Control  Target Ranges:  Prepandial:   less than 140 mg/dL      Peak postprandial:   less than 180 mg/dL (1-2 hours)      Critically ill patients:  140 - 180 mg/dL   Results for Carolyn Valdez, Carolyn Valdez (MRN [id number]) as of 03/07/2019 10:06  Ref. Range 03/06/2019 07:45 03/06/2019 11:51 03/06/2019 16:54 03/06/2019 21:50 03/07/2019 08:30  Glucose-Capillary Latest Ref Range: 70 - 99 mg/dL 316 (H) 344 (H) 238 (H) 231 (H) 196 (H)   Review of Glycemic Control  Diabetes history:DM2 Outpatient Diabetes medications:Lantus 55 units QHS, Glipizide 5 mg daily, Novolog 0-5 units TID per correction scale Current orders for Inpatient glycemic control:Lantus 15 units daily, Novolog 0-9 units TID with meals, Novolog 0-5 units QHS, Novolog 7 units TID with meals for meal coverage  Inpatient Diabetes Program Recommendations:  Insulin-Basal: Please consider increasing Lantus to 18 units daily.  Insulin - Meal Coverage: Please consider increasing meal coverage to Novolog 10 units TID with meals if patient eats at least 50% of meals.  Thanks, Barnie Alderman, RN, MSN, CDE Diabetes Coordinator Inpatient Diabetes Program [phone] (Team Pager from 8am to 5pm

## 2019-03-07 NOTE — Progress Notes (Signed)
[facility]                                                                                                                                                                                  Patient Demographics   Carolyn Valdez, is a 78 y.o. female, DOB - 12/15/1940, WUG:[id number]  Admit date - 03/02/2019   Admitting Physician Vaughan Basta, MD  Outpatient Primary MD for the patient is [facility]   LOS - 5  Subjective: Patient continues to complain of weakness   Review of Systems:   CONSTITUTIONAL: No documented fever. No fatigue, weakness. No weight gain, no weight loss.  EYES: No blurry or double vision.  ENT: No tinnitus. No postnasal drip. No redness of the oropharynx.  RESPIRATORY: No cough, no wheeze, no hemoptysis.  Positive dyspnea.  CARDIOVASCULAR: No chest pain. No orthopnea. No palpitations. No syncope.  GASTROINTESTINAL: Positive nausea, no vomiting or diarrhea. No abdominal pain. No melena or hematochezia.  GENITOURINARY: No dysuria or hematuria.  ENDOCRINE: No polyuria or nocturia. No heat or cold intolerance.  HEMATOLOGY: No anemia. No bruising. No bleeding.  INTEGUMENTARY: No rashes. No lesions.  MUSCULOSKELETAL: No arthritis. No swelling. No gout.  Positive back pain NEUROLOGIC: No numbness, tingling, or ataxia. No seizure-type activity.  PSYCHIATRIC: No anxiety. No insomnia. No ADD.    Vitals:   Vitals:   03/06/19 1654 03/06/19 2000 03/07/19 0357 03/07/19 0830  BP: (!) 125/44 (!) 135/46 (!) 114/43 (!) 142/46  Pulse: (!) 59 63 62 62  Resp: 16  20 20   Temp: 98.8 F (37.1 C) 98 F (36.7 C) (!) 97.4 F (36.3 C) 98.1 F (36.7 C)  TempSrc: Oral Oral Oral Oral  SpO2: 99% 97% 96% 98%  Weight:   118.3 kg   Height:        Wt Readings from Last 3 Encounters:  03/07/19 118.3 kg  10/23/18 112.9 kg  09/28/18 113.2 kg     Intake/Output Summary (Last 24 hours) at 03/07/2019 1315 Last data filed at  03/07/2019 1140 Gross per 24 hour  Intake 827.81 ml  Output 2000 ml  Net -1172.19 ml    Physical Exam:   GENERAL: Pleasant-appearing in no apparent distress.  HEAD, EYES, EARS, NOSE AND THROAT: Atraumatic, normocephalic. Extraocular muscles are intact. Pupils equal and reactive to light. Sclerae anicteric. No conjunctival injection. No oro-pharyngeal erythema.  NECK: Supple. There is no jugular venous distention. No bruits, no lymphadenopathy, no thyromegaly.  HEART: Regular rate and rhythm,. No murmurs, no rubs, no clicks.  LUNGS: Bilateral crackles throughout both lung.  ABDOMEN: Soft, flat, nontender, nondistended. Has good bowel sounds. No hepatosplenomegaly appreciated.  EXTREMITIES: No evidence of any cyanosis, clubbing, or peripheral edema.  +2 pedal and radial pulses bilaterally.  NEUROLOGIC: The patient is alert, awake, and oriented x3 with no focal motor or sensory deficits appreciated bilaterally.  SKIN: Moist and warm with no rashes appreciated.  Psych: Not anxious, depressed LN: No inguinal LN enlargement    Antibiotics   Anti-infectives (From admission, onward)   None      Medications   Scheduled Meds: . aspirin  81 mg Oral Daily  . atorvastatin  40 mg Oral q1800  . citalopram  10 mg Oral Daily  . docusate sodium  100 mg Oral BID  . feeding supplement (ENSURE ENLIVE)  237 mL Oral BID BM  . fluticasone  2 spray Each Nare Daily  . heparin  5,000 Units Subcutaneous Q8H  . hydrALAZINE  25 mg Oral TID WC  . insulin aspart  0-5 Units Subcutaneous QHS  . insulin aspart  0-9 Units Subcutaneous TID WC  . insulin aspart  7 Units Subcutaneous TID WC  . insulin glargine  15 Units Subcutaneous Daily  . lamoTRIgine  25 mg Oral BID  . levETIRAcetam  250 mg Oral BID  . loratadine  10 mg Oral Daily  . Melatonin  5 mg Oral QHS  . metoprolol tartrate  25 mg Oral BID  . multivitamin with minerals  1 tablet Oral Daily  . nystatin   Topical BID  . OLANZapine  10 mg Oral QHS   . pantoprazole  40 mg Oral BID  . polyethylene glycol  17 g Oral Daily  . senna-docusate  1 tablet Oral QHS  . sodium chloride flush  3 mL Intravenous Q12H  . vitamin B-12  1,000 mcg Oral Daily   Continuous Infusions: . sodium chloride 250 mL (03/06/19 2002)  . furosemide (LASIX) infusion 6 mg/hr (03/06/19 1800)   PRN Meds:.sodium chloride, acetaminophen, albuterol, alum & mag hydroxide-simeth, clonazePAM, docusate sodium, ondansetron, promethazine   Data Review:   Micro Results Recent Results (from the past 240 hour(s))  SARS Coronavirus 2 (CEPHEID - Performed in [place] hospital lab), Hosp Order     Status: None   Collection Time: 03/02/19  6:35 AM  Result Value Ref Range Status   SARS Coronavirus 2 NEGATIVE NEGATIVE Final    Comment: (NOTE) If result is NEGATIVE SARS-CoV-2 target nucleic acids are NOT DETECTED. The SARS-CoV-2 RNA is generally detectable in upper and lower  respiratory specimens during the acute phase of infection. The lowest  concentration of SARS-CoV-2 viral copies this assay can detect is 250  copies / mL. A negative result does not preclude SARS-CoV-2 infection  and should not be used as the sole basis for treatment or other  patient management decisions.  A negative result may occur with  improper specimen collection / handling, submission of specimen other  than nasopharyngeal swab, presence of viral mutation(s) within the  areas targeted by this assay, and inadequate number of viral copies  (<250 copies / mL). A negative result must be combined with clinical  observations, patient history, and epidemiological information. If result is POSITIVE SARS-CoV-2 target nucleic acids are DETECTED. The SARS-CoV-2 RNA is generally detectable in upper and lower  respiratory specimens dur ing the acute phase of infection.  Positive  results are indicative of active infection with SARS-CoV-2.  Clinical  correlation with patient history and other diagnostic  information is  necessary to determine patient infection status.  Positive results do  not rule out bacterial infection or  co-infection with other viruses. If result is PRESUMPTIVE POSTIVE SARS-CoV-2 nucleic acids MAY BE PRESENT.   A presumptive positive result was obtained on the submitted specimen  and confirmed on repeat testing.  While 2019 novel coronavirus  (SARS-CoV-2) nucleic acids may be present in the submitted sample  additional confirmatory testing may be necessary for epidemiological  and / or clinical management purposes  to differentiate between  SARS-CoV-2 and other Sarbecovirus currently known to infect humans.  If clinically indicated additional testing with an alternate test  methodology [id number]) is advised. The SARS-CoV-2 RNA is generally  detectable in upper and lower respiratory sp ecimens during the acute  phase of infection. The expected result is Negative. Fact Sheet for Patients:  StrictlyIdeas.no Fact Sheet for Healthcare Providers: BankingDealers.co.za This test is not yet approved or cleared by the [place] FDA and has been authorized for detection and/or diagnosis of SARS-CoV-2 by FDA under an Emergency Use Authorization (EUA).  This EUA will remain in effect (meaning this test can be used) for the duration of the COVID-19 declaration under Section 564(b)(1) of the Act, 21 U.S.C. section 360bbb-3(b)(1), unless the authorization is terminated or revoked sooner. Performed at [facility], [address] [place], [place] [address]   MRSA PCR Screening     Status: None   Collection Time: 03/06/19  8:15 PM  Result Value Ref Range Status   MRSA by PCR NEGATIVE NEGATIVE Final    Comment:        The GeneXpert MRSA Assay (FDA approved for NASAL specimens only), is one component of a comprehensive MRSA colonization surveillance program. It is not intended to diagnose MRSA infection nor to guide  or monitor treatment for MRSA infections. Performed at [facility], [address] [place], [place] [address]     Radiology Reports Dg Chest 2 View  Result Date: 03/03/2019 CLINICAL DATA:  78 year old female with a known history of diastolic congestive heart failure. EXAM: CHEST - 2 VIEW COMPARISON:  Mar 02, 2019 FINDINGS: Stable cardiomegaly. The hila and mediastinum are normal. No pulmonary nodules, masses, focal infiltrates, or overt edema. IMPRESSION: Cardiomegaly.  No acute abnormalities. Electronically Signed   By: Dorise Bullion III M.D   On: 03/03/2019 08:42   Dg Chest 2 View  Result Date: 03/02/2019 CLINICAL DATA:  78 year old female with shortness of breath for 2 days. Former smoker. EXAM: CHEST - 2 VIEW COMPARISON:  Portable chest 08/03/2018 and earlier. FINDINGS: Seated AP and lateral views of the chest. Stable cardiomegaly and mediastinal contours. Visualized tracheal air column is within normal limits. Stable lung volumes. No pneumothorax, pleural effusion or confluent pulmonary opacity. Calcified aortic atherosclerosis. Acute on chronic pulmonary interstitial opacity. No pleural fluid identified in the fissures on the lateral. Osteopenia.  Paucity of bowel gas in the upper abdomen. IMPRESSION: 1. Chronic cardiomegaly with acute on chronic pulmonary interstitial opacity. No pleural fluid identified. Consider pulmonary interstitial edema and viral/atypical respiratory infection. 2.  Aortic Atherosclerosis (ICD10-I70.0). Electronically Signed   By: Genevie Ann M.D.   On: 03/02/2019 06:57   US Renal  Result Date: 03/02/2019 CLINICAL DATA:  Acute renal failure EXAM: RENAL / URINARY TRACT ULTRASOUND COMPLETE COMPARISON:  Renal ultrasound 07/05/2018 FINDINGS: Right Kidney: Renal measurements: 10.8 x 6.6 x 6.5 cm = volume: 243 mL . Echogenicity within normal limits. No mass or hydronephrosis visualized. Left Kidney: Renal measurements: 10.8 x 6.7 x 6.7 cm = volume: 254 mL.  Echogenicity within normal limits. No mass or hydronephrosis visualized. Bladder:  Appears normal for degree of bladder distention. IMPRESSION: Negative renal ultrasound. Electronically Signed   By: Franchot Gallo M.D.   On: 03/02/2019 16:50     CBC Recent Labs  Lab 03/02/19 0530 03/03/19 0523  WBC 9.0 6.6  HGB 9.0* 7.9*  HCT 29.0* 25.7*  PLT 268 223  MCV 92.9 93.8  MCH 28.8 28.8  MCHC 31.0 30.7  RDW 14.8 14.6    Chemistries  Recent Labs  Lab 03/02/19 0530 03/03/19 0523 03/04/19 0507 03/05/19 0551 03/06/19 0458 03/07/19 0319  NA 136  136 140 140 134* 136 134*  K 5.1  5.0 5.0 5.0 5.2* 5.7* 4.7  CL 100  100 103 102 93* 95* 91*  CO2 28  27 30 31 31  32 33*  GLUCOSE 222*  228* 96 146* 238* 395* 166*  BUN 67*  69* 69* 71* 79* 86* 85*  CREATININE 3.48*  3.54* 3.44* 3.44* 3.81* 3.51* 3.55*  CALCIUM 8.9  8.8* 9.1 9.2 8.7* 9.0 9.1  AST 12*  --   --   --   --   --   ALT 8  --   --   --   --   --   ALKPHOS 100  --   --   --   --   --   BILITOT 0.4  --   --   --   --   --    ------------------------------------------------------------------------------------------------------------------ estimated creatinine clearance is 17.9 mL/min (A) (by C-G formula based on SCr of 3.55 mg/dL (H)). ------------------------------------------------------------------------------------------------------------------ No results for input(s): HGBA1C in the last 72 hours. ------------------------------------------------------------------------------------------------------------------ No results for input(s): CHOL, HDL, LDLCALC, TRIG, CHOLHDL, LDLDIRECT in the last 72 hours. ------------------------------------------------------------------------------------------------------------------ No results for input(s): TSH, T4TOTAL, T3FREE, THYROIDAB in the last 72 hours.  Invalid input(s):  FREET3 ------------------------------------------------------------------------------------------------------------------ No results for input(s): VITAMINB12, FOLATE, FERRITIN, TIBC, IRON, RETICCTPCT in the last 72 hours.  Coagulation profile No results for input(s): INR, PROTIME in the last 168 hours.  No results for input(s): DDIMER in the last 72 hours.  Cardiac Enzymes Recent Labs  Lab 03/02/19 1239 03/02/19 1544 03/02/19 2211  TROPONINI 0.28* 0.26* 0.31*   ------------------------------------------------------------------------------------------------------------------ Invalid input(s): POCBNP    Assessment & Plan   *Acute on chronic diastolic congestive heart failure Continue IV Lasix drip appreciate cardiology input  *Acute renal failure on CKD stage III Renal function stable   *Hyperkalemia Stable  *Elevated troponin Due to demand ischemia  *Diabetes mellitus  Blood sugars elevated will adjust her insulin.  *Hyperlipidemia Continue statin.  *Hypertension Blood pressure borderline will adjust antihypertensives     Code Status Orders  (From admission, onward)         Start     Ordered   03/02/19 1211  Full code  Continuous     03/02/19 1210        Code Status History    Date Active Date Inactive Code Status Order ID Comments User Context   08/04/2018 0556 08/07/2018 1951 DNR [id number]  Harrie Foreman, MD Inpatient   07/06/2018 1407 07/14/2018 1603 DNR [id number]  Vaughan Basta, MD Inpatient   07/04/2018 2200 07/06/2018 1407 Full Code [id number]  Demetrios Loll, MD Inpatient   04/22/2018 1702 04/26/2018 1851 DNR [id number]  Max Sane, MD Inpatient   02/21/2017 1629 02/24/2017 1747 DNR [id number]  Fritzi Mandes, MD Inpatient   01/31/2017 1617 02/04/2017 1350 DNR [id number]  Max Sane, MD Inpatient   01/23/2017 1320 01/31/2017 1617 Full Code [id number]  Henreitta Leber, MD Inpatient  10/05/2016 1216 10/26/2016 1740 DNR [id number]  Knox Royalty, NP  Inpatient   09/24/2016 1731 10/05/2016 1216 Full Code [id number]  Gladstone Lighter, MD Inpatient   09/01/2016 0217 09/05/2016 1628 Full Code [id number]  Harvie Bridge, DO Inpatient   08/09/2016 2027 08/12/2016 2015 Full Code [id number]  Henreitta Leber, MD Inpatient   09/30/2015 1423 10/07/2015 1737 Full Code [id number]  Loletha Grayer, MD ED   09/05/2015 1756 09/09/2015 1738 DNR [id number]  Loletha Grayer, MD ED    Advance Directive Documentation     Most Recent Value  Type of Advance Directive  Healthcare Power of [place], Living will  Pre-existing out of facility DNR order (yellow form or pink MOST form)  -  "MOST" Form in Place?  -           Consults cardiology  DVT Prophylaxis Heparin  Lab Results  Component Value Date   PLT 223 03/03/2019     Time Spent in minutes   35 minutes greater than 50% of time spent in care coordination and counseling patient regarding the condition and plan of care.   Dustin Flock M.D on 03/07/2019 at 1:15 PM  Between 7am to 6pm - Pager - [phone]  After 6pm go to [email or web address] - Proofreader  [employer]   Office  [phone]

## 2019-03-07 NOTE — Progress Notes (Signed)
[facility], [place] 03/07/19  Subjective:  Patient continues to have significantly diminished renal function. Creatinine 3.5 with an EGFR of 12. Urine output was 1.3 L over the preceding 24 hours.  Objective:  Vital signs in last 24 hours:  Temp:  [97.4 F (36.3 C)-98.8 F (37.1 C)] 98.1 F (36.7 C) (05/20 0830) Pulse Rate:  [59-63] 62 (05/20 0830) Resp:  [16-20] 20 (05/20 0830) BP: (114-142)/(43-46) 142/46 (05/20 0830) SpO2:  [96 %-99 %] 98 % (05/20 0830) Weight:  [118.3 kg] 118.3 kg (05/20 0357)  Weight change: 1.315 kg Filed Weights   03/05/19 0400 03/06/19 0304 03/07/19 0357  Weight: 117.5 kg 116.9 kg 118.3 kg    Intake/Output:    Intake/Output Summary (Last 24 hours) at 03/07/2019 1211 Last data filed at 03/07/2019 1140 Gross per 24 hour  Intake 827.81 ml  Output 2000 ml  Net -1172.19 ml     Physical Exam: General:  Obese, no acute distress, laying in the bed  HEENT  [place] O2, moist oral mucous membranes, anicteric  Neck  supple  Pulm/lungs  normal breathing effort, [place] O2  CVS/Heart  soft systolic murmur, [id number]  Abdomen:   Soft, obese, nontender  Extremities:  1+ pitting edema  Neurologic:  Alert, able to follow commands  Skin:  No acute rashes    External urinary catheter       Basic Metabolic Panel:  Recent Labs  Lab 03/03/19 0523 03/04/19 0507 03/05/19 0551 03/06/19 0458 03/07/19 0319  NA 140 140 134* 136 134*  K 5.0 5.0 5.2* 5.7* 4.7  CL 103 102 93* 95* 91*  CO2 30 31 31  32 33*  GLUCOSE 96 146* 238* 395* 166*  BUN 69* 71* 79* 86* 85*  CREATININE 3.44* 3.44* 3.81* 3.51* 3.55*  CALCIUM 9.1 9.2 8.7* 9.0 9.1  PHOS 6.1* 5.7* 6.0* 5.1* 4.7*     CBC: Recent Labs  Lab 03/02/19 0530 03/03/19 0523  WBC 9.0 6.6  HGB 9.0* 7.9*  HCT 29.0* 25.7*  MCV 92.9 93.8  PLT 268 223     No results found for: HEPBSAG, HEPBSAB, HEPBIGM    Microbiology:  Recent Results (from the past 240 hour(s))  SARS Coronavirus 2  (CEPHEID - Performed in [place] hospital lab), Hosp Order     Status: None   Collection Time: 03/02/19  6:35 AM  Result Value Ref Range Status   SARS Coronavirus 2 NEGATIVE NEGATIVE Final    Comment: (NOTE) If result is NEGATIVE SARS-CoV-2 target nucleic acids are NOT DETECTED. The SARS-CoV-2 RNA is generally detectable in upper and lower  respiratory specimens during the acute phase of infection. The lowest  concentration of SARS-CoV-2 viral copies this assay can detect is 250  copies / mL. A negative result does not preclude SARS-CoV-2 infection  and should not be used as the sole basis for treatment or other  patient management decisions.  A negative result may occur with  improper specimen collection / handling, submission of specimen other  than nasopharyngeal swab, presence of viral mutation(s) within the  areas targeted by this assay, and inadequate number of viral copies  (<250 copies / mL). A negative result must be combined with clinical  observations, patient history, and epidemiological information. If result is POSITIVE SARS-CoV-2 target nucleic acids are DETECTED. The SARS-CoV-2 RNA is generally detectable in upper and lower  respiratory specimens dur ing the acute phase of infection.  Positive  results are indicative of active infection with SARS-CoV-2.  Clinical  correlation with patient history and other diagnostic information is  necessary to determine patient infection status.  Positive results do  not rule out bacterial infection or co-infection with other viruses. If result is PRESUMPTIVE POSTIVE SARS-CoV-2 nucleic acids MAY BE PRESENT.   A presumptive positive result was obtained on the submitted specimen  and confirmed on repeat testing.  While 2019 novel coronavirus  (SARS-CoV-2) nucleic acids may be present in the submitted sample  additional confirmatory testing may be necessary for epidemiological  and / or clinical management purposes  to differentiate  between  SARS-CoV-2 and other Sarbecovirus currently known to infect humans.  If clinically indicated additional testing with an alternate test  methodology [phone]) is advised. The SARS-CoV-2 RNA is generally  detectable in upper and lower respiratory sp ecimens during the acute  phase of infection. The expected result is Negative. Fact Sheet for Patients:  StrictlyIdeas.no Fact Sheet for Healthcare Providers: BankingDealers.co.za This test is not yet approved or cleared by the [place] FDA and has been authorized for detection and/or diagnosis of SARS-CoV-2 by FDA under an Emergency Use Authorization (EUA).  This EUA will remain in effect (meaning this test can be used) for the duration of the COVID-19 declaration under Section 564(b)(1) of the Act, 21 U.S.C. section 360bbb-3(b)(1), unless the authorization is terminated or revoked sooner. Performed at [facility], [address] [place], [place] [address]   MRSA PCR Screening     Status: None   Collection Time: 03/06/19  8:15 PM  Result Value Ref Range Status   MRSA by PCR NEGATIVE NEGATIVE Final    Comment:        The GeneXpert MRSA Assay (FDA approved for NASAL specimens only), is one component of a comprehensive MRSA colonization surveillance program. It is not intended to diagnose MRSA infection nor to guide or monitor treatment for MRSA infections. Performed at [facility], [address] [place], [place] [address]     Coagulation Studies: No results for input(s): LABPROT, INR in the last 72 hours.  Urinalysis: No results for input(s): COLORURINE, LABSPEC, PHURINE, GLUCOSEU, HGBUR, BILIRUBINUR, KETONESUR, PROTEINUR, UROBILINOGEN, NITRITE, LEUKOCYTESUR in the last 72 hours.  Invalid input(s): APPERANCEUR    Imaging: No results found.   Medications:   . sodium chloride 250 mL (03/06/19 2002)  . furosemide (LASIX) infusion 6 mg/hr  (03/06/19 1800)   . aspirin  81 mg Oral Daily  . atorvastatin  40 mg Oral q1800  . citalopram  10 mg Oral Daily  . docusate sodium  100 mg Oral BID  . feeding supplement (ENSURE ENLIVE)  237 mL Oral BID BM  . fluticasone  2 spray Each Nare Daily  . heparin  5,000 Units Subcutaneous Q8H  . hydrALAZINE  25 mg Oral TID WC  . insulin aspart  0-5 Units Subcutaneous QHS  . insulin aspart  0-9 Units Subcutaneous TID WC  . insulin aspart  7 Units Subcutaneous TID WC  . insulin glargine  15 Units Subcutaneous Daily  . lamoTRIgine  25 mg Oral BID  . levETIRAcetam  250 mg Oral BID  . loratadine  10 mg Oral Daily  . Melatonin  5 mg Oral QHS  . metoprolol tartrate  25 mg Oral BID  . multivitamin with minerals  1 tablet Oral Daily  . nystatin   Topical BID  . OLANZapine  10 mg Oral QHS  . pantoprazole  40 mg Oral BID  . polyethylene glycol  17 g Oral Daily  .  senna-docusate  1 tablet Oral QHS  . sodium chloride flush  3 mL Intravenous Q12H  . vitamin B-12  1,000 mcg Oral Daily   sodium chloride, acetaminophen, albuterol, alum & mag hydroxide-simeth, clonazePAM, docusate sodium, ondansetron, promethazine  Assessment/ Plan:  78 y.o. Caucasian female with with congestive heart failure , hypertension, diabetes mellitus type II, obstructive sleep apnea, nephrolithiasis   Principal Problem:   Acute on chronic diastolic CHF (congestive heart failure) (HCC) Active Problems:   Acute on chronic heart failure with preserved ejection fraction (HFpEF) (HCC)   Acute renal failure superimposed on chronic kidney disease ([place])   #.  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   Last known outpatient creatinine from December 12 is 2.69/GFR 17 Underlying CKD likely secondary to atherosclerosis, diabetes and hypertension -Patient continues to have diminished kidney function.  EGFR remains low at 12.  She states that she is agreeable to dialysis if indicated.  She is still making urine at this point  time and made 1.3 L over the preceding 24 hours.  However suspect that once she is transition to p.o. diuretics her urine output may drop.  Continue to monitor progress closely.  Recent Labs    03/04/19 0507 03/05/19 0551 03/06/19 0458 03/07/19 0319  CREATININE 3.44* 3.81* 3.51* 3.55*    #. Anemia of CKD  Lab Results  Component Value Date   HGB 7.9 (L) 03/03/2019  Hemoglobin low at 7.9.  Consider Epogen every 2 weeks.  #.  Hyperphosphatemia     Component Value Date/Time   PTH 51 03/04/2019 0507   Lab Results  Component Value Date   PHOS 4.7 (H) 03/07/2019  Phosphorus down a bit to 4.7.  Continue to monitor.  #. Diabetes type 2 with CKD Hemoglobin A1C (%)  Date Value  09/04/2014 6.2   Hgb A1c MFr Bld (%)  Date Value  04/22/2018 7.6 (H)    #Grade 2 diastolic dysfunction with pulmonary hypertension, tricuspid regurgitation, generalized edema Creatinine clearance found to be 16 however creatinine clearance often overestimates renal function.  Hyperkalemia. Potassium down to 4.7 after starting the patient on Lokelma.  Continue Lokelma 1 additional day.    LOS: 5 Rickeya Manus 5/20/202012:11 PM  [place], [place]

## 2019-03-07 NOTE — Care Management Important Message (Signed)
Important Message  Patient Details  Name: Carolyn Valdez MRN: [id number] Date of Birth: 08/25/1941   Medicare Important Message Given:  Yes    Dannette Barbara 03/07/2019, 10:58 AM

## 2019-03-08 LAB — BASIC METABOLIC PANEL
Anion gap: 11 (ref 5–15)
BUN: 85 mg/dL — ABNORMAL HIGH (ref 8–23)
CO2: 34 mmol/L — ABNORMAL HIGH (ref 22–32)
Calcium: 9.5 mg/dL (ref 8.9–10.3)
Chloride: 93 mmol/L — ABNORMAL LOW (ref 98–111)
Creatinine, Ser: 3.8 mg/dL — ABNORMAL HIGH (ref 0.44–1.00)
GFR calc Af Amer: 13 mL/min — ABNORMAL LOW (ref >60)
GFR calc non Af Amer: 11 mL/min — ABNORMAL LOW (ref >60)
Glucose, Bld: 249 mg/dL — ABNORMAL HIGH (ref 70–99)
Potassium: 4.9 mmol/L (ref 3.5–5.1)
Sodium: 138 mmol/L (ref 135–145)

## 2019-03-08 LAB — GLUCOSE, CAPILLARY
Glucose-Capillary: 141 mg/dL — ABNORMAL HIGH (ref 70–99)
Glucose-Capillary: 216 mg/dL — ABNORMAL HIGH (ref 70–99)
Glucose-Capillary: 256 mg/dL — ABNORMAL HIGH (ref 70–99)
Glucose-Capillary: 237 mg/dL — ABNORMAL HIGH (ref 70–99)

## 2019-03-08 MED ORDER — TRAMADOL HCL 50 MG PO TABS
50.0000 mg | ORAL_TABLET | Freq: Four times a day (QID) | ORAL | Status: DC | PRN
  Administered 2019-03-08: 50 mg via ORAL
  Filled 2019-03-08: qty 1

## 2019-03-08 MED ORDER — OXYCODONE-ACETAMINOPHEN 5-325 MG PO TABS
1.0000 | ORAL_TABLET | Freq: Four times a day (QID) | ORAL | Status: DC | PRN
  Administered 2019-03-08 – 2019-03-19 (×6): 1 via ORAL
  Filled 2019-03-08 (×7): qty 1

## 2019-03-08 MED ORDER — ENSURE ENLIVE PO LIQD
237.0000 mL | Freq: Two times a day (BID) | ORAL | Status: DC
  Administered 2019-03-09 – 2019-03-19 (×14): 237 mL via ORAL

## 2019-03-08 MED ORDER — INSULIN GLARGINE 100 UNIT/ML ~~LOC~~ SOLN
20.0000 [IU] | Freq: Every day | SUBCUTANEOUS | Status: DC
  Administered 2019-03-08 – 2019-03-09 (×2): 20 [IU] via SUBCUTANEOUS
  Filled 2019-03-08 (×3): qty 0.2

## 2019-03-08 NOTE — Progress Notes (Signed)
Inpatient Diabetes Program Recommendations  AACE/ADA: New Consensus Statement on Inpatient Glycemic Control  Target Ranges:  Prepandial:   less than 140 mg/dL      Peak postprandial:   less than 180 mg/dL (1-2 hours)      Critically ill patients:  140 - 180 mg/dL  Results for Carolyn Valdez, Carolyn Valdez (MRN [id number]) as of 03/08/2019 10:18  Ref. Range 03/07/2019 08:30 03/07/2019 11:27 03/07/2019 16:37 03/07/2019 21:15 03/08/2019 07:44  Glucose-Capillary Latest Ref Range: 70 - 99 mg/dL 196 (H) 253 (H) 243 (H) 243 (H) 141 (H)   Results for Carolyn Valdez, Carolyn Valdez (MRN [id number]) as of 03/07/2019 10:06  Ref. Range 03/06/2019 07:45 03/06/2019 11:51 03/06/2019 16:54 03/06/2019 21:50  Glucose-Capillary Latest Ref Range: 70 - 99 mg/dL 316 (H) 344 (H) 238 (H) 231 (H)   Review of Glycemic Control  Diabetes history:DM2 Outpatient Diabetes medications:Lantus 55 units QHS, Glipizide 5 mg daily, Novolog 0-5 units TID per correction scale Current orders for Inpatient glycemic control:Lantus 15 units daily, Novolog 0-9 units TID with meals, Novolog 0-5 units QHS, Novolog 7 units TID with meals for meal coverage  Inpatient Diabetes Program Recommendations:  Insulin - Meal Coverage: Please consider increasing meal coverage to Novolog 10 units TID with meals if patient eats at least 50% of meals.  Thanks, Barnie Alderman, RN, MSN, CDE Diabetes Coordinator Inpatient Diabetes Program [phone] (Team Pager from 8am to 5pm

## 2019-03-08 NOTE — Progress Notes (Signed)
Physical Therapy Treatment Patient Details Name: Carolyn Valdez MRN: [id number] DOB: 07/19/1941 Today's Date: 03/08/2019    History of Present Illness  78 y.o. female with a known history of diastolic congestive heart failure, chronic kidney disease, breast cancer, chest pain, GI bleed, hypertension, hyperlipidemia-lives in [place], for last few days she has worsening shortness of breath so came to emergency room for further management. Patient says she chronically sleeps in a recliner.  She does not have a primary cardiologist in the clinic. This time she denied any associated chest pain or palpitation.  She denied any associated cough fever or sputum production. In ER she was noted to have acute CHF exacerbation and given to hospitalist team. She is currently admitted for acute on chronic CHF, elevated troponin, and AOCKD IV-V.     PT Comments    Pt was able to do some limited ambulation in her room.  She had some fatigue with the effort but actually maintained good safety and mechanics without over-reliance on the walker.  She needed frequent cuing and encouragement but was able to follow all instructions relatively well.  Her O2 dud drop during activity on 3 liters (from mid 90s to 88%, returned to 90s relatively quickly in sitting.)   Pt needed a lot of cuing, and ultimately direct assist to get into bed and then more so attempting to scoot in bed.  She c/o some chronic back pain/LE neuropathy t/o the session but was not functionally limited due to this.  Pt reports that normally she does not do even this much at her facility and feels functionally able to return when medically cleared to discharge.    Follow Up Recommendations  Home health PT;Other (comment)(return to ALF with PT services)     Equipment Recommendations  None recommended by PT    Recommendations for Other Services       Precautions / Restrictions Precautions Precautions: Fall Restrictions Weight Bearing  Restrictions: No    Mobility  Bed Mobility Overal bed mobility: Needs Assistance Bed Mobility: Supine to Sit;Sit to Supine     Supine to sit: Min assist Sit to supine: Mod assist   General bed mobility comments: Pt showed good effort in getting to EOB, needed only light assist.  More assist required to get LEs back into bed and especially with scooting in bed once in.  Pt reports that she normally sleeps in recliner and does not do bed mobility.  Transfers Overall transfer level: Modified independent Equipment used: Rolling walker (2 wheeled) Transfers: Sit to/from Stand Sit to Stand: Min guard;From elevated surface         General transfer comment: Pt unable to rise from standard height bed, but with good effort was able to get to standing with surface up ~2"  Ambulation/Gait Ambulation/Gait assistance: Supervision Gait Distance (Feet): 30 Feet Assistive device: Rolling walker (2 wheeled)       General Gait Details: 3L/min O2 via nasal canula the entire time. SaO2 is 96% before ambulation, drops as low as 88% on exertion, did return to low 90s relatively quickly in sitting. Pt demonstrates good safety with use of rolling walker. Gait speed is decreased but functional for limited household mobility.  Report she does not typically even walk that far at her facility.   Stairs             Wheelchair Mobility    Modified Rankin (Stroke Patients Only)       Balance   Sitting-balance support: No  upper extremity supported Sitting balance-Leahy Scale: Good     Standing balance support: Bilateral upper extremity supported Standing balance-Leahy Scale: Fair                              Cognition Arousal/Alertness: Lethargic Behavior During Therapy: Flat affect Overall Cognitive Status: Within Functional Limits for tasks assessed                                        Exercises      General Comments        Pertinent  Vitals/Pain Pain Assessment: 0-10 Pain Score: 5  Pain Location: low back, L>R leg neuropathy pain    Home Living                      Prior Function            PT Goals (current goals can now be found in the care plan section) Progress towards PT goals: Progressing toward goals    Frequency    Min 2X/week      PT Plan Current plan remains appropriate    Co-evaluation              AM-PAC PT "6 Clicks" Mobility   Outcome Measure  Help needed turning from your back to your side while in a flat bed without using bedrails?: A Little Help needed moving from lying on your back to sitting on the side of a flat bed without using bedrails?: A Lot Help needed moving to and from a bed to a chair (including a wheelchair)?: A Little Help needed standing up from a chair using your arms (e.g., wheelchair or bedside chair)?: A Little Help needed to walk in hospital room?: A Little Help needed climbing 3-5 steps with a railing? : Total 6 Click Score: 15    End of Session Equipment Utilized During Treatment: Gait belt Activity Tolerance: Patient limited by fatigue Patient left: with bed alarm set;with call bell/phone within reach Nurse Communication: Mobility status(need to reapply purewic) PT Visit Diagnosis: Unsteadiness on feet (R26.81);Muscle weakness (generalized) (M62.81)     Time: 1610-9604 PT Time Calculation (min) (ACUTE ONLY): 34 min  Charges:  $Gait Training: 8-22 mins $Therapeutic Activity: 8-22 mins                     Kreg Shropshire, DPT 03/08/2019, 6:04 PM

## 2019-03-08 NOTE — Progress Notes (Signed)
[facility], [place] 03/08/19  Subjective:  Patient remains on Lasix drip. Creatinine 3.8 with an EGFR of 11. Urine output was 2.1 L over the preceding 24 hours.  Objective:  Vital signs in last 24 hours:  Temp:  [97.5 F (36.4 C)-98.6 F (37 C)] 98.6 F (37 C) (05/21 0741) Pulse Rate:  [58-62] 60 (05/21 0741) Resp:  [16-22] 18 (05/21 0741) BP: (128-130)/(43-47) 129/43 (05/21 0741) SpO2:  [95 %-98 %] 95 % (05/21 0741) Weight:  [113.9 kg] 113.9 kg (05/21 0401)  Weight change: -4.4 kg Filed Weights   03/06/19 0304 03/07/19 0357 03/08/19 0401  Weight: 116.9 kg 118.3 kg 113.9 kg    Intake/Output:    Intake/Output Summary (Last 24 hours) at 03/08/2019 1436 Last data filed at 03/08/2019 0659 Gross per 24 hour  Intake 594.81 ml  Output 1400 ml  Net -805.19 ml     Physical Exam: General:  Obese, no acute distress, laying in the bed  HEENT  [place] O2, moist oral mucous membranes, anicteric  Neck  supple  Pulm/lungs  CTAB normal effort  CVS/Heart  S1S2 no rubs  Abdomen:   Soft, obese, nontender  Extremities:  trace pitting edema  Neurologic:  Alert, able to follow commands  Skin:  No acute rashes    External urinary catheter       Basic Metabolic Panel:  Recent Labs  Lab 03/03/19 0523 03/04/19 0507 03/05/19 0551 03/06/19 0458 03/07/19 0319 03/08/19 1105  NA 140 140 134* 136 134* 138  K 5.0 5.0 5.2* 5.7* 4.7 4.9  CL 103 102 93* 95* 91* 93*  CO2 30 31 31  32 33* 34*  GLUCOSE 96 146* 238* 395* 166* 249*  BUN 69* 71* 79* 86* 85* 85*  CREATININE 3.44* 3.44* 3.81* 3.51* 3.55* 3.80*  CALCIUM 9.1 9.2 8.7* 9.0 9.1 9.5  PHOS 6.1* 5.7* 6.0* 5.1* 4.7*  --      CBC: Recent Labs  Lab 03/02/19 0530 03/03/19 0523  WBC 9.0 6.6  HGB 9.0* 7.9*  HCT 29.0* 25.7*  MCV 92.9 93.8  PLT 268 223     No results found for: HEPBSAG, HEPBSAB, HEPBIGM    Microbiology:  Recent Results (from the past 240 hour(s))  SARS Coronavirus 2 (CEPHEID -  Performed in [place] hospital lab), Hosp Order     Status: None   Collection Time: 03/02/19  6:35 AM  Result Value Ref Range Status   SARS Coronavirus 2 NEGATIVE NEGATIVE Final    Comment: (NOTE) If result is NEGATIVE SARS-CoV-2 target nucleic acids are NOT DETECTED. The SARS-CoV-2 RNA is generally detectable in upper and lower  respiratory specimens during the acute phase of infection. The lowest  concentration of SARS-CoV-2 viral copies this assay can detect is 250  copies / mL. A negative result does not preclude SARS-CoV-2 infection  and should not be used as the sole basis for treatment or other  patient management decisions.  A negative result may occur with  improper specimen collection / handling, submission of specimen other  than nasopharyngeal swab, presence of viral mutation(s) within the  areas targeted by this assay, and inadequate number of viral copies  (<250 copies / mL). A negative result must be combined with clinical  observations, patient history, and epidemiological information. If result is POSITIVE SARS-CoV-2 target nucleic acids are DETECTED. The SARS-CoV-2 RNA is generally detectable in upper and lower  respiratory specimens dur ing the acute phase of infection.  Positive  results are indicative  of active infection with SARS-CoV-2.  Clinical  correlation with patient history and other diagnostic information is  necessary to determine patient infection status.  Positive results do  not rule out bacterial infection or co-infection with other viruses. If result is PRESUMPTIVE POSTIVE SARS-CoV-2 nucleic acids MAY BE PRESENT.   A presumptive positive result was obtained on the submitted specimen  and confirmed on repeat testing.  While 2019 novel coronavirus  (SARS-CoV-2) nucleic acids may be present in the submitted sample  additional confirmatory testing may be necessary for epidemiological  and / or clinical management purposes  to differentiate between   SARS-CoV-2 and other Sarbecovirus currently known to infect humans.  If clinically indicated additional testing with an alternate test  methodology [phone]) is advised. The SARS-CoV-2 RNA is generally  detectable in upper and lower respiratory sp ecimens during the acute  phase of infection. The expected result is Negative. Fact Sheet for Patients:  StrictlyIdeas.no Fact Sheet for Healthcare Providers: BankingDealers.co.za This test is not yet approved or cleared by the [place] FDA and has been authorized for detection and/or diagnosis of SARS-CoV-2 by FDA under an Emergency Use Authorization (EUA).  This EUA will remain in effect (meaning this test can be used) for the duration of the COVID-19 declaration under Section 564(b)(1) of the Act, 21 U.S.C. section 360bbb-3(b)(1), unless the authorization is terminated or revoked sooner. Performed at [facility], [address] [place], [place] [address]   MRSA PCR Screening     Status: None   Collection Time: 03/06/19  8:15 PM  Result Value Ref Range Status   MRSA by PCR NEGATIVE NEGATIVE Final    Comment:        The GeneXpert MRSA Assay (FDA approved for NASAL specimens only), is one component of a comprehensive MRSA colonization surveillance program. It is not intended to diagnose MRSA infection nor to guide or monitor treatment for MRSA infections. Performed at [facility], [address] [place], [place] [address]     Coagulation Studies: No results for input(s): LABPROT, INR in the last 72 hours.  Urinalysis: No results for input(s): COLORURINE, LABSPEC, PHURINE, GLUCOSEU, HGBUR, BILIRUBINUR, KETONESUR, PROTEINUR, UROBILINOGEN, NITRITE, LEUKOCYTESUR in the last 72 hours.  Invalid input(s): APPERANCEUR    Imaging: No results found.   Medications:   . sodium chloride Stopped (03/07/19 1733)  . furosemide (LASIX) infusion 4 mg/hr  (03/08/19 1209)   . aspirin  81 mg Oral Daily  . atorvastatin  40 mg Oral q1800  . citalopram  10 mg Oral Daily  . docusate sodium  100 mg Oral BID  . feeding supplement (ENSURE ENLIVE)  237 mL Oral BID BM  . fluticasone  2 spray Each Nare Daily  . heparin  5,000 Units Subcutaneous Q8H  . hydrALAZINE  25 mg Oral TID WC  . insulin aspart  0-5 Units Subcutaneous QHS  . insulin aspart  0-9 Units Subcutaneous TID WC  . insulin aspart  7 Units Subcutaneous TID WC  . insulin glargine  20 Units Subcutaneous Daily  . lamoTRIgine  25 mg Oral BID  . levETIRAcetam  250 mg Oral BID  . loratadine  10 mg Oral Daily  . Melatonin  5 mg Oral QHS  . metoprolol tartrate  25 mg Oral BID  . multivitamin with minerals  1 tablet Oral Daily  . nystatin   Topical BID  . OLANZapine  10 mg Oral QHS  . pantoprazole  40 mg Oral BID  . polyethylene  glycol  17 g Oral Daily  . senna-docusate  1 tablet Oral QHS  . sodium chloride flush  3 mL Intravenous Q12H  . vitamin B-12  1,000 mcg Oral Daily   sodium chloride, acetaminophen, albuterol, alum & mag hydroxide-simeth, clonazePAM, docusate sodium, ondansetron, promethazine, traMADol  Assessment/ Plan:  78 y.o. Caucasian female with with congestive heart failure , hypertension, diabetes mellitus type II, obstructive sleep apnea, nephrolithiasis   Principal Problem:   Acute on chronic diastolic CHF (congestive heart failure) (HCC) Active Problems:   Acute on chronic heart failure with preserved ejection fraction (HFpEF) (HCC)   Acute renal failure superimposed on chronic kidney disease ([place])   #.  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   Last known outpatient creatinine from December 12 is 2.69/GFR 17 Underlying CKD likely secondary to atherosclerosis, diabetes and hypertension -Renal function remains significantly diminished.  Urine output was 2.1 L over the preceding 24 hours.  However EGFR remains low at 11.  Patient may end up requiring renal  replacement therapy as she does have some low-grade uremic symptoms.  Recent Labs    03/05/19 0551 03/06/19 0458 03/07/19 0319 03/08/19 1105  CREATININE 3.81* 3.51* 3.55* 3.80*    #. Anemia of CKD  Lab Results  Component Value Date   HGB 7.9 (L) 03/03/2019  Hemoglobin remains low at 7.9.  Patient has received Epogen this hospitalization.  #.  Hyperphosphatemia     Component Value Date/Time   PTH 51 03/04/2019 0507   Lab Results  Component Value Date   PHOS 4.7 (H) 03/07/2019  Phosphorus within target range at 4.7..  #. Diabetes type 2 with CKD Hemoglobin A1C (%)  Date Value  09/04/2014 6.2   Hgb A1c MFr Bld (%)  Date Value  04/22/2018 7.6 (H)    #Grade 2 diastolic dysfunction with pulmonary hypertension, tricuspid regurgitation, generalized edema Creatinine clearance found to be 16 however creatinine clearance often overestimates renal function.  EGFR low at 11, may need to consider HD in the relative near future.   Hyperkalemia. Potassium currently 4.9.  Milly Jakob has been stopped.   LOS: 6 Atalie Oros 5/21/20202:36 PM  [place], [place]

## 2019-03-08 NOTE — Progress Notes (Signed)
MD notified. Pt is still complaining of 5/10 back pain after prn tramadol. Orders for oxycodone received. I will continue to assess.

## 2019-03-08 NOTE — Progress Notes (Signed)
[place] at [facility]                                                                                                                                                                                  Patient Demographics   Carolyn Valdez, is a 78 y.o. female, DOB - 1941-05-30, FGH:[id number]  Admit date - 03/02/2019   Admitting Physician Vaughan Basta, MD  Outpatient Primary MD for the patient is [facility]   LOS - 6  Subjective: Patient continues to complain of weakness   Review of Systems:   CONSTITUTIONAL: No documented fever.  Positive fatigue, positive weakness. No weight gain, no weight loss.  EYES: No blurry or double vision.  ENT: No tinnitus. No postnasal drip. No redness of the oropharynx.  RESPIRATORY: No cough, no wheeze, no hemoptysis.  Positive dyspnea.  CARDIOVASCULAR: No chest pain. No orthopnea. No palpitations. No syncope.  GASTROINTESTINAL: Positive nausea, no vomiting or diarrhea. No abdominal pain. No melena or hematochezia.  GENITOURINARY: No dysuria or hematuria.  ENDOCRINE: No polyuria or nocturia. No heat or cold intolerance.  HEMATOLOGY: No anemia. No bruising. No bleeding.  INTEGUMENTARY: No rashes. No lesions.  MUSCULOSKELETAL: No arthritis. No swelling. No gout.  Positive back pain NEUROLOGIC: No numbness, tingling, or ataxia. No seizure-type activity.  PSYCHIATRIC: No anxiety. No insomnia. No ADD.    Vitals:   Vitals:   03/07/19 1636 03/07/19 2028 03/08/19 0401 03/08/19 0741  BP: (!) 128/46 (!) 130/47 (!) 130/44 (!) 129/43  Pulse: (!) 58 (!) 58 62 60  Resp: 20 16 (!) 22 18  Temp: (!) 97.5 F (36.4 C) 98.2 F (36.8 C) 97.8 F (36.6 C) 98.6 F (37 C)  TempSrc: Oral Oral Oral Oral  SpO2: 98% 98% 97% 95%  Weight:   113.9 kg   Height:        Wt Readings from Last 3 Encounters:  03/08/19 113.9 kg  10/23/18 112.9 kg  09/28/18 113.2 kg     Intake/Output Summary (Last 24 hours) at 03/08/2019  1255 Last data filed at 03/08/2019 0659 Gross per 24 hour  Intake 594.81 ml  Output 1400 ml  Net -805.19 ml    Physical Exam:   GENERAL: Pleasant-appearing in no apparent distress.  HEAD, EYES, EARS, NOSE AND THROAT: Atraumatic, normocephalic. Extraocular muscles are intact. Pupils equal and reactive to light. Sclerae anicteric. No conjunctival injection. No oro-pharyngeal erythema.  NECK: Supple. There is no jugular venous distention. No bruits, no lymphadenopathy, no thyromegaly.  HEART: Regular rate and rhythm,. No murmurs, no rubs, no clicks.  LUNGS: Bilateral crackles throughout both lung.  ABDOMEN: Soft, flat, nontender, nondistended. Has good bowel sounds.  No hepatosplenomegaly appreciated.  EXTREMITIES: No evidence of any cyanosis, clubbing, or peripheral edema.  +2 pedal and radial pulses bilaterally.  NEUROLOGIC: The patient is alert, awake, and oriented x3 with no focal motor or sensory deficits appreciated bilaterally.  SKIN: Moist and warm with no rashes appreciated.  Psych: Not anxious, depressed LN: No inguinal LN enlargement    Antibiotics   Anti-infectives (From admission, onward)   None      Medications   Scheduled Meds: . aspirin  81 mg Oral Daily  . atorvastatin  40 mg Oral q1800  . citalopram  10 mg Oral Daily  . docusate sodium  100 mg Oral BID  . feeding supplement (ENSURE ENLIVE)  237 mL Oral BID BM  . fluticasone  2 spray Each Nare Daily  . heparin  5,000 Units Subcutaneous Q8H  . hydrALAZINE  25 mg Oral TID WC  . insulin aspart  0-5 Units Subcutaneous QHS  . insulin aspart  0-9 Units Subcutaneous TID WC  . insulin aspart  7 Units Subcutaneous TID WC  . insulin glargine  15 Units Subcutaneous Daily  . lamoTRIgine  25 mg Oral BID  . levETIRAcetam  250 mg Oral BID  . loratadine  10 mg Oral Daily  . Melatonin  5 mg Oral QHS  . metoprolol tartrate  25 mg Oral BID  . multivitamin with minerals  1 tablet Oral Daily  . nystatin   Topical BID  .  OLANZapine  10 mg Oral QHS  . pantoprazole  40 mg Oral BID  . polyethylene glycol  17 g Oral Daily  . senna-docusate  1 tablet Oral QHS  . sodium chloride flush  3 mL Intravenous Q12H  . vitamin B-12  1,000 mcg Oral Daily   Continuous Infusions: . sodium chloride Stopped (03/07/19 1733)  . furosemide (LASIX) infusion 4 mg/hr (03/08/19 1209)   PRN Meds:.sodium chloride, acetaminophen, albuterol, alum & mag hydroxide-simeth, clonazePAM, docusate sodium, ondansetron, promethazine, traMADol   Data Review:   Micro Results Recent Results (from the past 240 hour(s))  SARS Coronavirus 2 (CEPHEID - Performed in [facility] hospital lab), Hosp Order     Status: None   Collection Time: 03/02/19  6:35 AM  Result Value Ref Range Status   SARS Coronavirus 2 NEGATIVE NEGATIVE Final    Comment: (NOTE) If result is NEGATIVE SARS-CoV-2 target nucleic acids are NOT DETECTED. The SARS-CoV-2 RNA is generally detectable in upper and lower  respiratory specimens during the acute phase of infection. The lowest  concentration of SARS-CoV-2 viral copies this assay can detect is 250  copies / mL. A negative result does not preclude SARS-CoV-2 infection  and should not be used as the sole basis for treatment or other  patient management decisions.  A negative result may occur with  improper specimen collection / handling, submission of specimen other  than nasopharyngeal swab, presence of viral mutation(s) within the  areas targeted by this assay, and inadequate number of viral copies  (<250 copies / mL). A negative result must be combined with clinical  observations, patient history, and epidemiological information. If result is POSITIVE SARS-CoV-2 target nucleic acids are DETECTED. The SARS-CoV-2 RNA is generally detectable in upper and lower  respiratory specimens dur ing the acute phase of infection.  Positive  results are indicative of active infection with SARS-CoV-2.  Clinical  correlation with  patient history and other diagnostic information is  necessary to determine patient infection status.  Positive results do  not rule  out bacterial infection or co-infection with other viruses. If result is PRESUMPTIVE POSTIVE SARS-CoV-2 nucleic acids MAY BE PRESENT.   A presumptive positive result was obtained on the submitted specimen  and confirmed on repeat testing.  While 2019 novel coronavirus  (SARS-CoV-2) nucleic acids may be present in the submitted sample  additional confirmatory testing may be necessary for epidemiological  and / or clinical management purposes  to differentiate between  SARS-CoV-2 and other Sarbecovirus currently known to infect humans.  If clinically indicated additional testing with an alternate test  methodology [phone]) is advised. The SARS-CoV-2 RNA is generally  detectable in upper and lower respiratory sp ecimens during the acute  phase of infection. The expected result is Negative. Fact Sheet for Patients:  StrictlyIdeas.no Fact Sheet for Healthcare Providers: BankingDealers.co.za This test is not yet approved or cleared by the [place] FDA and has been authorized for detection and/or diagnosis of SARS-CoV-2 by FDA under an Emergency Use Authorization (EUA).  This EUA will remain in effect (meaning this test can be used) for the duration of the COVID-19 declaration under Section 564(b)(1) of the Act, 21 U.S.C. section 360bbb-3(b)(1), unless the authorization is terminated or revoked sooner. Performed at [facility], [address] [place], [place] [address]   MRSA PCR Screening     Status: None   Collection Time: 03/06/19  8:15 PM  Result Value Ref Range Status   MRSA by PCR NEGATIVE NEGATIVE Final    Comment:        The GeneXpert MRSA Assay (FDA approved for NASAL specimens only), is one component of a comprehensive MRSA colonization surveillance program. It is not intended to  diagnose MRSA infection nor to guide or monitor treatment for MRSA infections. Performed at [facility], [address], [place], [place] [address]     Radiology Reports Dg Chest 2 View  Result Date: 03/03/2019 CLINICAL DATA:  78 year old female with a known history of diastolic congestive heart failure. EXAM: CHEST - 2 VIEW COMPARISON:  Mar 02, 2019 FINDINGS: Stable cardiomegaly. The hila and mediastinum are normal. No pulmonary nodules, masses, focal infiltrates, or overt edema. IMPRESSION: Cardiomegaly.  No acute abnormalities. Electronically Signed   By: Dorise Bullion III M.D   On: 03/03/2019 08:42   Dg Chest 2 View  Result Date: 03/02/2019 CLINICAL DATA:  78 year old female with shortness of breath for 2 days. Former smoker. EXAM: CHEST - 2 VIEW COMPARISON:  Portable chest 08/03/2018 and earlier. FINDINGS: Seated AP and lateral views of the chest. Stable cardiomegaly and mediastinal contours. Visualized tracheal air column is within normal limits. Stable lung volumes. No pneumothorax, pleural effusion or confluent pulmonary opacity. Calcified aortic atherosclerosis. Acute on chronic pulmonary interstitial opacity. No pleural fluid identified in the fissures on the lateral. Osteopenia.  Paucity of bowel gas in the upper abdomen. IMPRESSION: 1. Chronic cardiomegaly with acute on chronic pulmonary interstitial opacity. No pleural fluid identified. Consider pulmonary interstitial edema and viral/atypical respiratory infection. 2.  Aortic Atherosclerosis (ICD10-I70.0). Electronically Signed   By: Genevie Ann M.D.   On: 03/02/2019 06:57   US Renal  Result Date: 03/02/2019 CLINICAL DATA:  Acute renal failure EXAM: RENAL / URINARY TRACT ULTRASOUND COMPLETE COMPARISON:  Renal ultrasound 07/05/2018 FINDINGS: Right Kidney: Renal measurements: 10.8 x 6.6 x 6.5 cm = volume: 243 mL . Echogenicity within normal limits. No mass or hydronephrosis visualized. Left Kidney: Renal measurements: 10.8 x  6.7 x 6.7 cm = volume: 254 mL. Echogenicity within normal limits. No mass  or hydronephrosis visualized. Bladder: Appears normal for degree of bladder distention. IMPRESSION: Negative renal ultrasound. Electronically Signed   By: Franchot Gallo M.D.   On: 03/02/2019 16:50     CBC Recent Labs  Lab 03/02/19 0530 03/03/19 0523  WBC 9.0 6.6  HGB 9.0* 7.9*  HCT 29.0* 25.7*  PLT 268 223  MCV 92.9 93.8  MCH 28.8 28.8  MCHC 31.0 30.7  RDW 14.8 14.6    Chemistries  Recent Labs  Lab 03/02/19 0530  03/04/19 0507 03/05/19 0551 03/06/19 0458 03/07/19 0319 03/08/19 1105  NA 136  136   < > 140 134* 136 134* 138  K 5.1  5.0   < > 5.0 5.2* 5.7* 4.7 4.9  CL 100  100   < > 102 93* 95* 91* 93*  CO2 28  27   < > 31 31 32 33* 34*  GLUCOSE 222*  228*   < > 146* 238* 395* 166* 249*  BUN 67*  69*   < > 71* 79* 86* 85* 85*  CREATININE 3.48*  3.54*   < > 3.44* 3.81* 3.51* 3.55* 3.80*  CALCIUM 8.9  8.8*   < > 9.2 8.7* 9.0 9.1 9.5  AST 12*  --   --   --   --   --   --   ALT 8  --   --   --   --   --   --   ALKPHOS 100  --   --   --   --   --   --   BILITOT 0.4  --   --   --   --   --   --    < > = values in this interval not displayed.   ------------------------------------------------------------------------------------------------------------------ estimated creatinine clearance is 16.4 mL/min (A) (by C-G formula based on SCr of 3.8 mg/dL (H)). ------------------------------------------------------------------------------------------------------------------ No results for input(s): HGBA1C in the last 72 hours. ------------------------------------------------------------------------------------------------------------------ No results for input(s): CHOL, HDL, LDLCALC, TRIG, CHOLHDL, LDLDIRECT in the last 72 hours. ------------------------------------------------------------------------------------------------------------------ No results for input(s): TSH, T4TOTAL, T3FREE, THYROIDAB in the  last 72 hours.  Invalid input(s): FREET3 ------------------------------------------------------------------------------------------------------------------ No results for input(s): VITAMINB12, FOLATE, FERRITIN, TIBC, IRON, RETICCTPCT in the last 72 hours.  Coagulation profile No results for input(s): INR, PROTIME in the last 168 hours.  No results for input(s): DDIMER in the last 72 hours.  Cardiac Enzymes Recent Labs  Lab 03/02/19 1239 03/02/19 1544 03/02/19 2211  TROPONINI 0.28* 0.26* 0.31*   ------------------------------------------------------------------------------------------------------------------ Invalid input(s): POCBNP    Assessment & Plan   *Acute on chronic diastolic congestive heart failure Continue IV Lasix drip per nephrology dose will be decreased today and switch to oral tomorrow  *Acute renal failure on CKD stage III Renal function stable   *Hyperkalemia Stable  *Elevated troponin Due to demand ischemia  *Diabetes mellitus  Blood sugars elevated will adjust her insulin.  *Hyperlipidemia Continue statin.  *Hypertension Blood pressure stable  *Lethargy I will obtain a physical therapy consult encourage patient to ambulate     Code Status Orders  (From admission, onward)         Start     Ordered   03/02/19 1211  Full code  Continuous     03/02/19 1210        Code Status History    Date Active Date Inactive Code Status Order ID Comments User Context   08/04/2018 0556 08/07/2018 1951 DNR [id number]  Harrie Foreman, MD Inpatient  07/06/2018 1407 07/14/2018 1603 DNR [id number]  Vaughan Basta, MD Inpatient   07/04/2018 2200 07/06/2018 1407 Full Code [id number]  Demetrios Loll, MD Inpatient   04/22/2018 1702 04/26/2018 1851 DNR [id number]  Max Sane, MD Inpatient   02/21/2017 1629 02/24/2017 1747 DNR [id number]  Fritzi Mandes, MD Inpatient   01/31/2017 1617 02/04/2017 1350 DNR [id number]  Max Sane, MD Inpatient   01/23/2017 1320  01/31/2017 1617 Full Code [id number]  Henreitta Leber, MD Inpatient   10/05/2016 1216 10/26/2016 1740 DNR [id number]  Knox Royalty, NP Inpatient   09/24/2016 1731 10/05/2016 1216 Full Code [id number]  Gladstone Lighter, MD Inpatient   09/01/2016 0217 09/05/2016 1628 Full Code [id number]  Hugelmeyer, [place], DO Inpatient   08/09/2016 2027 08/12/2016 2015 Full Code [id number]  Henreitta Leber, MD Inpatient   09/30/2015 1423 10/07/2015 1737 Full Code [id number]  Loletha Grayer, MD ED   09/05/2015 1756 09/09/2015 1738 DNR [id number]  Loletha Grayer, MD ED    Advance Directive Documentation     Most Recent Value  Type of Advance Directive  Healthcare Power of Attorney, Living will  Pre-existing out of facility DNR order (yellow form or pink MOST form)  -  "MOST" Form in Place?  -           Consults cardiology  DVT Prophylaxis Heparin  Lab Results  Component Value Date   PLT 223 03/03/2019     Time Spent in minutes   35 minutes greater than 50% of time spent in care coordination and counseling patient regarding the condition and plan of care.   Dustin Flock M.D on 03/08/2019 at 12:55 PM  Between 7am to 6pm - Pager - [phone]  After 6pm go to [email or web address] - Proofreader  [employer]   Office  [phone]

## 2019-03-09 ENCOUNTER — Telehealth: Payer: Medicare Other | Admitting: Family

## 2019-03-09 ENCOUNTER — Encounter
Admission: EM | Disposition: A | Discharge status: Home Health | Payer: Self-pay | Source: Skilled Nursing Facility | Attending: Internal Medicine

## 2019-03-09 DIAGNOSIS — N179 Acute kidney failure, unspecified: Secondary | ICD-10-CM

## 2019-03-09 DIAGNOSIS — N189 Chronic kidney disease, unspecified: Secondary | ICD-10-CM

## 2019-03-09 DIAGNOSIS — I509 Heart failure, unspecified: Secondary | ICD-10-CM

## 2019-03-09 DIAGNOSIS — N184 Chronic kidney disease, stage 4 (severe): Secondary | ICD-10-CM

## 2019-03-09 HISTORY — PX: TEMPORARY DIALYSIS CATHETER: CATH118312

## 2019-03-09 LAB — BASIC METABOLIC PANEL
Anion gap: 8 (ref 5–15)
BUN: 91 mg/dL — ABNORMAL HIGH (ref 8–23)
CO2: 36 mmol/L — ABNORMAL HIGH (ref 22–32)
Calcium: 9 mg/dL (ref 8.9–10.3)
Chloride: 92 mmol/L — ABNORMAL LOW (ref 98–111)
Creatinine, Ser: 4.23 mg/dL — ABNORMAL HIGH (ref 0.44–1.00)
GFR calc Af Amer: 11 mL/min — ABNORMAL LOW (ref >60)
GFR calc non Af Amer: 10 mL/min — ABNORMAL LOW (ref >60)
Glucose, Bld: 224 mg/dL — ABNORMAL HIGH (ref 70–99)
Potassium: 5.2 mmol/L — ABNORMAL HIGH (ref 3.5–5.1)
Sodium: 136 mmol/L (ref 135–145)

## 2019-03-09 LAB — GLUCOSE, CAPILLARY
Glucose-Capillary: 181 mg/dL — ABNORMAL HIGH (ref 70–99)
Glucose-Capillary: 193 mg/dL — ABNORMAL HIGH (ref 70–99)
Glucose-Capillary: 203 mg/dL — ABNORMAL HIGH (ref 70–99)
Glucose-Capillary: 333 mg/dL — ABNORMAL HIGH (ref 70–99)

## 2019-03-09 LAB — PHOSPHORUS: Phosphorus: 4.5 mg/dL (ref 2.5–4.6)

## 2019-03-09 SURGERY — TEMPORARY DIALYSIS CATHETER
Anesthesia: Moderate Sedation

## 2019-03-09 MED ORDER — LIDOCAINE HCL (PF) 1 % IJ SOLN
5.0000 mL | INTRAMUSCULAR | Status: DC | PRN
  Filled 2019-03-09: qty 5

## 2019-03-09 MED ORDER — PENTAFLUOROPROP-TETRAFLUOROETH EX AERO
1.0000 "application " | INHALATION_SPRAY | CUTANEOUS | Status: DC | PRN
  Filled 2019-03-09: qty 30

## 2019-03-09 MED ORDER — SODIUM CHLORIDE 0.9 % IV SOLN: 100.0000 mL | INTRAVENOUS | Status: DC | PRN

## 2019-03-09 MED ORDER — CHLORHEXIDINE GLUCONATE CLOTH 2 % EX PADS
6.0000 | MEDICATED_PAD | Freq: Every day | CUTANEOUS | Status: DC
  Administered 2019-03-10 – 2019-03-19 (×7): 6 via TOPICAL

## 2019-03-09 MED ORDER — HEPARIN SODIUM (PORCINE) 1000 UNIT/ML DIALYSIS
1000.0000 [IU] | INTRAMUSCULAR | Status: DC | PRN
  Filled 2019-03-09: qty 1

## 2019-03-09 MED ORDER — LIDOCAINE-PRILOCAINE 2.5-2.5 % EX CREA
1.0000 "application " | TOPICAL_CREAM | CUTANEOUS | Status: DC | PRN
  Filled 2019-03-09: qty 5

## 2019-03-09 MED ORDER — TUBERCULIN PPD 5 UNIT/0.1ML ID SOLN
5.0000 [IU] | Freq: Once | INTRADERMAL | Status: AC
  Administered 2019-03-09: 5 [IU] via INTRADERMAL
  Filled 2019-03-09 (×2): qty 0.1

## 2019-03-09 MED ORDER — ALTEPLASE 2 MG IJ SOLR: 2.0000 mg | Freq: Once | INTRAMUSCULAR | Status: DC | PRN

## 2019-03-09 SURGICAL SUPPLY — 2 items
KIT DIALYSIS CATH TRI 30X13 (CATHETERS) ×3 IMPLANT
PACK ANGIOGRAPHY (CUSTOM PROCEDURE TRAY) ×3 IMPLANT

## 2019-03-09 NOTE — Progress Notes (Signed)
Pt's 1st HD Tx started w/o issue, pt is stable    03/09/19 2128  Vital Signs  Pulse Rate (!) 59  Pulse Rate Source Monitor  Resp 19  BP (!) 138/50  BP Location Left Arm  BP Method Automatic  Patient Position (if appropriate) Lying  Oxygen Therapy  SpO2 100 %  O2 Device Nasal Cannula  O2 Flow Rate (L/min) 2 L/min  Pulse Oximetry Type Continuous  During Hemodialysis Assessment  Blood Flow Rate (mL/min) 150 mL/min  Arterial Pressure (mmHg) -90 mmHg  Venous Pressure (mmHg) 90 mmHg  Transmembrane Pressure (mmHg) 40 mmHg  Ultrafiltration Rate (mL/min) 0 mL/min  Dialysate Flow Rate (mL/min) 300 ml/min  Conductivity: Machine  13.8  HD Safety Checks Performed Yes  Dialysis Fluid Bolus Normal Saline  Bolus Amount (mL) 250 mL  Intra-Hemodialysis Comments Tx initiated

## 2019-03-09 NOTE — Progress Notes (Signed)
HD Tx completed, tolerated well, pt stable.    03/09/19 2300  Vital Signs  Pulse Rate (!) 58  Pulse Rate Source Monitor  Resp 16  BP (!) 139/44  BP Location Left Arm  BP Method Automatic  Patient Position (if appropriate) Lying  Oxygen Therapy  SpO2 100 %  O2 Device Nasal Cannula  O2 Flow Rate (L/min) 2 L/min  Pulse Oximetry Type Continuous  During Hemodialysis Assessment  HD Safety Checks Performed Yes  KECN 17.4 KECN  Dialysis Fluid Bolus Normal Saline  Intra-Hemodialysis Comments Tx completed;Tolerated well  Hemodialysis Catheter  Placement Date: 03/09/19    Site Condition No complications  Blue Lumen Status Heparin locked  Red Lumen Status Heparin locked  Post treatment catheter status Capped and Clamped

## 2019-03-09 NOTE — Progress Notes (Signed)
PT Cancellation Note  Patient Details Name: BILLY ROCCO MRN: [id number] DOB: 1940/10/28   Cancelled Treatment:    Reason Eval/Treat Not Completed: Medical issues which prohibited therapy Pt with temporary fem cath placement this afternoon.   Kreg Shropshire, DPT 03/09/2019, 4:42 PM

## 2019-03-09 NOTE — Consult Note (Signed)
[facility] Vascular Consult Note  MRN : [id number]  Carolyn Valdez is a 78 y.o. (05-May-1941) female who presents with chief complaint of  Chief Complaint  Patient presents with  . Shortness of Breath   History of Present Illness:  Patient is admitted to the hospital with congestive heart failure exacerbation with significant overload, severe pulmonary hypertension and acute on chronic kidney failure.   The patient reports progressively worsening dyspnea and orthopnea.  The patient resides in an assisted living facility however sought medical attention due to progressively worsening symptomology.  Patient with known chronic kidney disease which has noted to worsen during this inpatient admission.  The nephrology service has decided to initiate dialysis at this time, and we are asked to place a temporary dialysis catheter for immediate dialysis use.    Consulted by Dr. Posey Pronto for placement of a temporary dialysis catheter. Current Facility-Administered Medications  Medication Dose Route Frequency Provider Last Rate Last Dose  . 0.9 %  sodium chloride infusion   Intravenous PRN Dustin Flock, MD   Stopped at 03/07/19 1733  . acetaminophen (TYLENOL) tablet 650 mg  650 mg Oral Q6H PRN Vaughan Basta, MD   650 mg at 03/08/19 1032  . albuterol (PROVENTIL) (2.5 MG/3ML) 0.083% nebulizer solution 2.5 mg  2.5 mg Nebulization Q6H PRN Vaughan Basta, MD      . alum & mag hydroxide-simeth (MAALOX/MYLANTA) 200-200-20 MG/5ML suspension 30 mL  30 mL Oral Q6H PRN Dustin Flock, MD      . aspirin chewable tablet 81 mg  81 mg Oral Daily Theora Gianotti, NP   81 mg at 03/09/19 1059  . atorvastatin (LIPITOR) tablet 40 mg  40 mg Oral q1800 Theora Gianotti, NP   40 mg at 03/08/19 1819  . citalopram (CELEXA) tablet 10 mg  10 mg Oral Daily Vaughan Basta, MD   10 mg at 03/09/19 1059  . clonazepam (KLONOPIN) disintegrating tablet 0.25 mg  0.25 mg  Oral Q12H PRN Vaughan Basta, MD   0.25 mg at 03/08/19 2148  . docusate sodium (COLACE) capsule 100 mg  100 mg Oral BID Vaughan Basta, MD   100 mg at 03/09/19 1059  . docusate sodium (COLACE) capsule 100 mg  100 mg Oral BID PRN Vaughan Basta, MD      . feeding supplement (ENSURE ENLIVE) (ENSURE ENLIVE) liquid 237 mL  237 mL Oral BID BM Ojie, Jude, MD   237 mL at 03/09/19 1100  . fluticasone (FLONASE) 50 MCG/ACT nasal spray 2 spray  2 spray Each Nare Daily Vaughan Basta, MD   2 spray at 03/09/19 1100  . furosemide (LASIX) 250 mg in dextrose 5 % 250 mL (1 mg/mL) infusion  4 mg/hr Intravenous Continuous Lateef, Munsoor, MD 4 mL/hr at 03/08/19 1835 4 mg/hr at 03/08/19 1835  . heparin injection 5,000 Units  5,000 Units Subcutaneous Q8H Vaughan Basta, MD   5,000 Units at 03/09/19 [phone]  . hydrALAZINE (APRESOLINE) tablet 25 mg  25 mg Oral TID WC Dustin Flock, MD   25 mg at 03/09/19 0907  . insulin aspart (novoLOG) injection 0-5 Units  0-5 Units Subcutaneous QHS Vaughan Basta, MD   2 Units at 03/08/19 2133  . insulin aspart (novoLOG) injection 0-9 Units  0-9 Units Subcutaneous TID WC Vaughan Basta, MD   3 Units at 03/09/19 0911  . insulin aspart (novoLOG) injection 7 Units  7 Units Subcutaneous TID WC Dustin Flock, MD   7 Units at 03/09/19 0912  .  insulin glargine (LANTUS) injection 20 Units  20 Units Subcutaneous Daily Dustin Flock, MD   20 Units at 03/08/19 2133  . lamoTRIgine (LAMICTAL) tablet 25 mg  25 mg Oral BID Vaughan Basta, MD   25 mg at 03/09/19 1059  . levETIRAcetam (KEPPRA) tablet 250 mg  250 mg Oral BID Vaughan Basta, MD   250 mg at 03/09/19 1059  . loratadine (CLARITIN) tablet 10 mg  10 mg Oral Daily Vaughan Basta, MD   10 mg at 03/09/19 1100  . Melatonin TABS 5 mg  5 mg Oral QHS Vaughan Basta, MD   5 mg at 03/08/19 2134  . metoprolol tartrate (LOPRESSOR) tablet 25 mg  25 mg Oral BID  Gladstone Lighter, MD   25 mg at 03/09/19 1058  . multivitamin with minerals tablet 1 tablet  1 tablet Oral Daily Vaughan Basta, MD   1 tablet at 03/09/19 1100  . nystatin (MYCOSTATIN/NYSTOP) topical powder   Topical BID Vaughan Basta, MD      . OLANZapine [facility]) tablet 10 mg  10 mg Oral QHS Vaughan Basta, MD   10 mg at 03/08/19 2135  . ondansetron (ZOFRAN-ODT) disintegrating tablet 8 mg  8 mg Oral Q8H PRN Vaughan Basta, MD   8 mg at 03/06/19 2210  . oxyCODONE-acetaminophen (PERCOCET/ROXICET) 5-325 MG per tablet 1 tablet  1 tablet Oral Q6H PRN Dustin Flock, MD   1 tablet at 03/09/19 0907  . pantoprazole (PROTONIX) EC tablet 40 mg  40 mg Oral BID Vaughan Basta, MD   40 mg at 03/09/19 1100  . polyethylene glycol (MIRALAX / GLYCOLAX) packet 17 g  17 g Oral Daily Vaughan Basta, MD   17 g at 03/09/19 1100  . promethazine (PHENERGAN) injection 12.5 mg  12.5 mg Intravenous Q6H PRN Vaughan Basta, MD   12.5 mg at 03/05/19 2251  . senna-docusate (Senokot-S) tablet 1 tablet  1 tablet Oral QHS Vaughan Basta, MD   1 tablet at 03/08/19 2131  . sodium chloride flush (NS) 0.9 % injection 3 mL  3 mL Intravenous Q12H Dustin Flock, MD   3 mL at 03/09/19 1000  . vitamin B-12 (CYANOCOBALAMIN) tablet 1,000 mcg  1,000 mcg Oral Daily Vaughan Basta, MD   1,000 mcg at 03/09/19 1059   Facility-Administered Medications Ordered in Other Encounters  Medication Dose Route Frequency Provider Last Rate Last Dose  . 0.9 %  sodium chloride infusion    Continuous PRN Dionne Bucy, CRNA       Past Medical History:  Diagnosis Date  . (HFpEF) heart failure with preserved ejection fraction ([place])    a. 06/2018 Echo: EF 60-65%, no rwma. Gr2 DD. Mild LVH. Ao sclerosis w/o stenosis. Mild MR. Mild to mod LAE. Nl RV fxn. Mild RAE. Mod TR. PASP 60-37mmHg.  Marland Kitchen Anxiety   . Breast cancer ([place]) 1997   a. 1997 s/p right breast mastectomy  . Chest pain     a. 11/2008 MV: No ischemia. EF 66%.  . CKD (chronic kidney disease) stage 4, GFR 15-29 ml/min (H[facility])   . Coronary artery calcification seen on CT scan    a. 01/2017 Chest CT: Atherosclerotic coronary Ca2+.  . Diabetes mellitus without complication ([place])   . DJD (degenerative joint disease)   . Dysuria   . GIB (gastrointestinal bleeding)    a. 07/2018 EGD: Gastritis w/ hemorrhage, duodenal angioectasia. Friable gastric mucosa w/ bleeding on contact. All treated w/ argon plasma coagulation; b. 07/2018 s/p 2u PRBCs.  . H/O total knee replacement  right  . HTN (hypertension)   . Hypercholesteremia   . Kidney stone   . Obesity   . Recurrent urinary tract infection   . SVT (supraventricular tachycardia) ([place])   . Urinary incontinence   . Vaginal atrophy   . Yeast vaginitis    Past Surgical History:  Procedure Laterality Date  . CARDIAC CATHETERIZATION    . CHOLECYSTECTOMY    . COLONOSCOPY N/A 08/06/2018   Procedure: COLONOSCOPY;  Surgeon: Virgel Manifold, MD;  Location: [facility] ENDOSCOPY;  Service: Endoscopy;  Laterality: N/A;  . ESOPHAGOGASTRODUODENOSCOPY N/A 08/06/2018   Procedure: ESOPHAGOGASTRODUODENOSCOPY (EGD);  Surgeon: Virgel Manifold, MD;  Location: [facility] ENDOSCOPY;  Service: Endoscopy;  Laterality: N/A;  . FOOT SURGERY    . JOINT REPLACEMENT    . MASTECTOMY Right 1997  . NASAL SEPTUM SURGERY    . PERIPHERAL VASCULAR CATHETERIZATION N/A 07/08/2015   Procedure: PICC Line Insertion;  Surgeon: Algernon Huxley, MD;  Location: [place] [facility];  Service: Cardiovascular;  Laterality: N/A;  . TONSILLECTOMY    . Vocal cords     Social History Social History   Tobacco Use  . Smoking status: Former Research scientist (life sciences)  . Smokeless tobacco: Former Systems developer    Quit date: 10/29/1995  Substance Use Topics  . Alcohol use: No    Alcohol/week: 0.0 standard drinks  . Drug use: No   Family History Family History  Problem Relation Age of Onset  . Lung cancer Father   . Hematuria Mother   .  Lung cancer Mother   . Kidney disease Neg Hx   . Bladder Cancer Neg Hx   . Breast cancer Neg Hx   Denies family history of peripheral artery disease, venous disease and renal disease.  Allergies  Allergen Reactions  . Sulfa Antibiotics Hives  . Biaxin [Clarithromycin] Hives  . Influenza A (H1n1) Monoval Vac Other (See Comments)    Pt states that she was told by her MD not to get the influenza vaccine.    . Morphine Other (See Comments)    Reaction:  Dizziness and confusion   . Pyridium [Phenazopyridine Hcl] Other (See Comments)    Reaction:  Unknown   . Ceftriaxone Anxiety  . Latex Rash  . Prednisone Rash  . Tape Rash   REVIEW OF SYSTEMS (Negative unless checked)  Constitutional: [] Weight loss  [] Fever  [] Chills Cardiac: [] Chest pain   [] Chest pressure   [] Palpitations   [x] Shortness of breath when laying flat   [x] Shortness of breath at rest   [x] Shortness of breath with exertion. Vascular:  [] Pain in legs with walking   [] Pain in legs at rest   [] Pain in legs when laying flat   [] Claudication   [] Pain in feet when walking  [] Pain in feet at rest  [] Pain in feet when laying flat   [] History of DVT   [] Phlebitis   [x] Swelling in legs   [] Varicose veins   [] Non-healing ulcers Pulmonary:   [] Uses home oxygen   [] Productive cough   [] Hemoptysis   [] Wheeze  [] COPD   [] Asthma Neurologic:  [] Dizziness  [] Blackouts   [] Seizures   [] History of stroke   [] History of TIA  [] Aphasia   [] Temporary blindness   [] Dysphagia   [] Weakness or numbness in arms   [] Weakness or numbness in legs Musculoskeletal:  [] Arthritis   [] Joint swelling   [] Joint pain   [] Low back pain Hematologic:  [] Easy bruising  [] Easy bleeding   [] Hypercoagulable state   [] Anemic  [] Hepatitis Gastrointestinal:  [] Blood  in stool   [] Vomiting blood  [] Gastroesophageal reflux/heartburn   [] Difficulty swallowing. Genitourinary:  [x] Chronic kidney disease   [] Difficult urination  [] Frequent urination  [] Burning with urination    [] Blood in urine Skin:  [] Rashes   [] Ulcers   [] Wounds Psychological:  [] History of anxiety   []  History of major depression.  Physical Examination  Vitals:   03/09/19 0133 03/09/19 0456 03/09/19 0750 03/09/19 1058  BP: (!) 128/42 (!) 114/40 (!) 127/45   Pulse: 62 (!) 56 (!) 57 62  Resp:  20    Temp:  98.3 F (36.8 C) 98 F (36.7 C)   TempSrc:   Oral   SpO2:  99% 98%   Weight:      Height:       Body mass index is 39.31 kg/m. Gen: WD/WN Head: [place]/AT, No temporalis wasting Ear/Nose/Throat: Hearing grossly intact, nares w/o erythema or drainage Eyes: Sclera non-icteric, conjunctiva clear Neck: Supple, no nuchal rigidity.  No JVD.  Pulmonary:  Good air movement, clear to auscultation bilaterally.  Cardiac: RRR, normal S1, S2, no Murmurs, rubs or gallops. Vascular: Bilateral extremities are warm distally to toes. Gastrointestinal: soft, non-tender/non-distended. No guarding/reflex.  Musculoskeletal: M/S 5/5 throughout.  Extremities without ischemic changes.  No deformity or atrophy.  Moderate edema in the lower extremities bilaterally Neurologic: Intact Psychiatric: Difficult to assess due to the severity of patient's illness. Dermatologic: No rashes or ulcers noted.   Lymph : No Cervical, Axillary, or Inguinal lymphadenopathy.  CBC Lab Results  Component Value Date   WBC 6.6 03/03/2019   HGB 7.9 (L) 03/03/2019   HCT 25.7 (L) 03/03/2019   MCV 93.8 03/03/2019   PLT 223 03/03/2019   BMET    Component Value Date/Time   NA 136 03/09/2019 0419   NA 138 09/28/2018 1517   NA 137 09/08/2014 1042   K 5.2 (H) 03/09/2019 0419   K 5.2 (H) 09/08/2014 1042   CL 92 (L) 03/09/2019 0419   CL 103 09/08/2014 1042   CO2 36 (H) 03/09/2019 0419   CO2 28 09/08/2014 1042   GLUCOSE 224 (H) 03/09/2019 0419   GLUCOSE 211 (H) 09/08/2014 1042   BUN 91 (H) 03/09/2019 0419   BUN 33 (H) 09/28/2018 1517   BUN 16 09/08/2014 1042   CREATININE 4.23 (H) 03/09/2019 0419   CREATININE 1.45 (H)  09/08/2014 1042   CALCIUM 9.0 03/09/2019 0419   CALCIUM 8.3 (L) 09/08/2014 1042   GFRNONAA 10 (L) 03/09/2019 0419   GFRNONAA 38 (L) 09/08/2014 1042   GFRNONAA 45 (L) 07/09/2014 1631   GFRAA 11 (L) 03/09/2019 0419   GFRAA 46 (L) 09/08/2014 1042   GFRAA 52 (L) 07/09/2014 1631   Estimated Creatinine Clearance: 14.7 mL/min (A) (by C-G formula based on SCr of 4.23 mg/dL (H)).  COAG Lab Results  Component Value Date   INR 1.1 09/28/2018   INR 1.56 07/05/2018   INR 1.2 08/23/2013   Radiology Dg Chest 2 View  Result Date: 03/03/2019 CLINICAL DATA:  78 year old female with a known history of diastolic congestive heart failure. EXAM: CHEST - 2 VIEW COMPARISON:  Mar 02, 2019 FINDINGS: Stable cardiomegaly. The hila and mediastinum are normal. No pulmonary nodules, masses, focal infiltrates, or overt edema. IMPRESSION: Cardiomegaly.  No acute abnormalities. Electronically Signed   By: Dorise Bullion III M.D   On: 03/03/2019 08:42   Dg Chest 2 View  Result Date: 03/02/2019 CLINICAL DATA:  78 year old female with shortness of breath for 2 days. Former smoker. EXAM: CHEST -  2 VIEW COMPARISON:  Portable chest 08/03/2018 and earlier. FINDINGS: Seated AP and lateral views of the chest. Stable cardiomegaly and mediastinal contours. Visualized tracheal air column is within normal limits. Stable lung volumes. No pneumothorax, pleural effusion or confluent pulmonary opacity. Calcified aortic atherosclerosis. Acute on chronic pulmonary interstitial opacity. No pleural fluid identified in the fissures on the lateral. Osteopenia.  Paucity of bowel gas in the upper abdomen. IMPRESSION: 1. Chronic cardiomegaly with acute on chronic pulmonary interstitial opacity. No pleural fluid identified. Consider pulmonary interstitial edema and viral/atypical respiratory infection. 2.  Aortic Atherosclerosis (ICD10-I70.0). Electronically Signed   By: Genevie Ann M.D.   On: 03/02/2019 06:57   US Renal  Result Date:  03/02/2019 CLINICAL DATA:  Acute renal failure EXAM: RENAL / URINARY TRACT ULTRASOUND COMPLETE COMPARISON:  Renal ultrasound 07/05/2018 FINDINGS: Right Kidney: Renal measurements: 10.8 x 6.6 x 6.5 cm = volume: 243 mL . Echogenicity within normal limits. No mass or hydronephrosis visualized. Left Kidney: Renal measurements: 10.8 x 6.7 x 6.7 cm = volume: 254 mL. Echogenicity within normal limits. No mass or hydronephrosis visualized. Bladder: Appears normal for degree of bladder distention. IMPRESSION: Negative renal ultrasound. Electronically Signed   By: Franchot Gallo M.D.   On: 03/02/2019 16:50   Assessment/Plan The patient is a 78 year old female with multiple medical issues including acute on chronic kidney disease 1.  Acute on chronic kidney disease: Patient with a known history of kidney disease which has now progressed.  BUN and creatinine has shown little improvement during her inpatient stay.  Patient continues to be symptomatic.  At this time, nephrology would like to start dialysis and the patient does not have an adequate access. We will proceed with temporary dialysis catheter placement at this time.  Risks and benefits discussed with patient and/or family, and the catheter will be placed to allow immediate initiation of dialysis.  If the patient's renal function does not improve throughout the hospital course, we will be happy to place a tunneled dialysis catheter for long term use prior to discharge.  2. Congestive heart failure exacerbation with significant overload: Hopefully, the initiation dialysis will help with symptoms.  As per primary team.  Discussed with Dr. Mayme Genta, PA-C  03/09/2019 11:43 AM

## 2019-03-09 NOTE — Progress Notes (Signed)
TB test applied to left forearm at 1805 today. Circled with marker. I will continue to assess.

## 2019-03-09 NOTE — Progress Notes (Signed)
[place] [facility]                                                                                                                                                                                  Patient Demographics   Carolyn Valdez, is a 78 y.o. female, DOB - January 12, 1941, OZH:[id number]  Admit date - 03/02/2019   Admitting Physician Vaughan Basta, MD  Outpatient Primary MD for the patient is [facility]   LOS - 7  Subjective: Continues to be very weak and lethargic   Review of Systems:   CONSTITUTIONAL: No documented fever.  Positive fatigue, positive weakness. No weight gain, no weight loss.  EYES: No blurry or double vision.  ENT: No tinnitus. No postnasal drip. No redness of the oropharynx.  RESPIRATORY: No cough, no wheeze, no hemoptysis.  Positive dyspnea.  CARDIOVASCULAR: No chest pain. No orthopnea. No palpitations. No syncope.  GASTROINTESTINAL: Positive nausea, no vomiting or diarrhea. No abdominal pain. No melena or hematochezia.  GENITOURINARY: No dysuria or hematuria.  ENDOCRINE: No polyuria or nocturia. No heat or cold intolerance.  HEMATOLOGY: No anemia. No bruising. No bleeding.  INTEGUMENTARY: No rashes. No lesions.  MUSCULOSKELETAL: No arthritis. No swelling. No gout.  Positive back pain NEUROLOGIC: No numbness, tingling, or ataxia. No seizure-type activity.  PSYCHIATRIC: No anxiety. No insomnia. No ADD.    Vitals:   Vitals:   03/09/19 0133 03/09/19 0456 03/09/19 0750 03/09/19 1058  BP: (!) 128/42 (!) 114/40 (!) 127/45   Pulse: 62 (!) 56 (!) 57 62  Resp:  20    Temp:  98.3 F (36.8 C) 98 F (36.7 C)   TempSrc:   Oral   SpO2:  99% 98%   Weight:      Height:        Wt Readings from Last 3 Encounters:  03/08/19 113.9 kg  10/23/18 112.9 kg  09/28/18 113.2 kg     Intake/Output Summary (Last 24 hours) at 03/09/2019 1327 Last data filed at 03/09/2019 1031 Gross per 24 hour  Intake 58.03 ml  Output  950 ml  Net -891.97 ml    Physical Exam:   GENERAL: Pleasant-appearing in no apparent distress.  HEAD, EYES, EARS, NOSE AND THROAT: Atraumatic, normocephalic. Extraocular muscles are intact. Pupils equal and reactive to light. Sclerae anicteric. No conjunctival injection. No oro-pharyngeal erythema.  NECK: Supple. There is no jugular venous distention. No bruits, no lymphadenopathy, no thyromegaly.  HEART: Regular rate and rhythm,. No murmurs, no rubs, no clicks.  LUNGS: Bilateral crackles throughout both lung.  ABDOMEN: Soft, flat, nontender, nondistended. Has good bowel sounds. No hepatosplenomegaly appreciated.  EXTREMITIES: No evidence of any  cyanosis, clubbing, or peripheral edema.  +2 pedal and radial pulses bilaterally.  NEUROLOGIC: The patient is alert, awake, and oriented x3 with no focal motor or sensory deficits appreciated bilaterally.  SKIN: Moist and warm with no rashes appreciated.  Psych: Not anxious, depressed LN: No inguinal LN enlargement    Antibiotics   Anti-infectives (From admission, onward)   None      Medications   Scheduled Meds: . [MAR Hold] aspirin  81 mg Oral Daily  . [MAR Hold] atorvastatin  40 mg Oral q1800  . [MAR Hold] citalopram  10 mg Oral Daily  . [MAR Hold] docusate sodium  100 mg Oral BID  . [MAR Hold] feeding supplement (ENSURE ENLIVE)  237 mL Oral BID BM  . [MAR Hold] fluticasone  2 spray Each Nare Daily  . [MAR Hold] heparin  5,000 Units Subcutaneous Q8H  . [MAR Hold] hydrALAZINE  25 mg Oral TID WC  . [MAR Hold] insulin aspart  0-5 Units Subcutaneous QHS  . [MAR Hold] insulin aspart  0-9 Units Subcutaneous TID WC  . [MAR Hold] insulin aspart  7 Units Subcutaneous TID WC  . [MAR Hold] insulin glargine  20 Units Subcutaneous Daily  . [MAR Hold] lamoTRIgine  25 mg Oral BID  . [MAR Hold] levETIRAcetam  250 mg Oral BID  . [MAR Hold] loratadine  10 mg Oral Daily  . [MAR Hold] Melatonin  5 mg Oral QHS  . [MAR Hold] metoprolol tartrate  25  mg Oral BID  . [MAR Hold] multivitamin with minerals  1 tablet Oral Daily  . [MAR Hold] nystatin   Topical BID  . [MAR Hold] OLANZapine  10 mg Oral QHS  . [MAR Hold] pantoprazole  40 mg Oral BID  . [MAR Hold] polyethylene glycol  17 g Oral Daily  . [MAR Hold] senna-docusate  1 tablet Oral QHS  . [MAR Hold] sodium chloride flush  3 mL Intravenous Q12H  . [MAR Hold] vitamin B-12  1,000 mcg Oral Daily   Continuous Infusions: . [MAR Hold] sodium chloride Stopped (03/07/19 1733)  . furosemide (LASIX) infusion 4 mg/hr (03/08/19 1835)   PRN Meds:.[MAR Hold] sodium chloride, [MAR Hold] acetaminophen, [MAR Hold] albuterol, [MAR Hold] alum & mag hydroxide-simeth, [MAR Hold] clonazePAM, [MAR Hold] docusate sodium, [MAR Hold] ondansetron, [MAR Hold] oxyCODONE-acetaminophen, [MAR Hold] promethazine   Data Review:   Micro Results Recent Results (from the past 240 hour(s))  SARS Coronavirus 2 (CEPHEID - Performed in [place] hospital lab), Hosp Order     Status: None   Collection Time: 03/02/19  6:35 AM  Result Value Ref Range Status   SARS Coronavirus 2 NEGATIVE NEGATIVE Final    Comment: (NOTE) If result is NEGATIVE SARS-CoV-2 target nucleic acids are NOT DETECTED. The SARS-CoV-2 RNA is generally detectable in upper and lower  respiratory specimens during the acute phase of infection. The lowest  concentration of SARS-CoV-2 viral copies this assay can detect is 250  copies / mL. A negative result does not preclude SARS-CoV-2 infection  and should not be used as the sole basis for treatment or other  patient management decisions.  A negative result may occur with  improper specimen collection / handling, submission of specimen other  than nasopharyngeal swab, presence of viral mutation(s) within the  areas targeted by this assay, and inadequate number of viral copies  (<250 copies / mL). A negative result must be combined with clinical  observations, patient history, and epidemiological  information. If result is POSITIVE SARS-CoV-2 target  nucleic acids are DETECTED. The SARS-CoV-2 RNA is generally detectable in upper and lower  respiratory specimens dur ing the acute phase of infection.  Positive  results are indicative of active infection with SARS-CoV-2.  Clinical  correlation with patient history and other diagnostic information is  necessary to determine patient infection status.  Positive results do  not rule out bacterial infection or co-infection with other viruses. If result is PRESUMPTIVE POSTIVE SARS-CoV-2 nucleic acids MAY BE PRESENT.   A presumptive positive result was obtained on the submitted specimen  and confirmed on repeat testing.  While 2019 novel coronavirus  (SARS-CoV-2) nucleic acids may be present in the submitted sample  additional confirmatory testing may be necessary for epidemiological  and / or clinical management purposes  to differentiate between  SARS-CoV-2 and other Sarbecovirus currently known to infect humans.  If clinically indicated additional testing with an alternate test  methodology [phone]) is advised. The SARS-CoV-2 RNA is generally  detectable in upper and lower respiratory sp ecimens during the acute  phase of infection. The expected result is Negative. Fact Sheet for Patients:  StrictlyIdeas.no Fact Sheet for Healthcare Providers: BankingDealers.co.za This test is not yet approved or cleared by the [place] FDA and has been authorized for detection and/or diagnosis of SARS-CoV-2 by FDA under an Emergency Use Authorization (EUA).  This EUA will remain in effect (meaning this test can be used) for the duration of the COVID-19 declaration under Section 564(b)(1) of the Act, 21 U.S.C. section 360bbb-3(b)(1), unless the authorization is terminated or revoked sooner. Performed at [facility], [address] [place], [place] [address]   MRSA PCR Screening      Status: None   Collection Time: 03/06/19  8:15 PM  Result Value Ref Range Status   MRSA by PCR NEGATIVE NEGATIVE Final    Comment:        The GeneXpert MRSA Assay (FDA approved for NASAL specimens only), is one component of a comprehensive MRSA colonization surveillance program. It is not intended to diagnose MRSA infection nor to guide or monitor treatment for MRSA infections. Performed at [facility], [address] [place], [place] [address]     Radiology Reports Dg Chest 2 View  Result Date: 03/03/2019 CLINICAL DATA:  78 year old female with a known history of diastolic congestive heart failure. EXAM: CHEST - 2 VIEW COMPARISON:  Mar 02, 2019 FINDINGS: Stable cardiomegaly. The hila and mediastinum are normal. No pulmonary nodules, masses, focal infiltrates, or overt edema. IMPRESSION: Cardiomegaly.  No acute abnormalities. Electronically Signed   By: Dorise Bullion III M.D   On: 03/03/2019 08:42   Dg Chest 2 View  Result Date: 03/02/2019 CLINICAL DATA:  78 year old female with shortness of breath for 2 days. Former smoker. EXAM: CHEST - 2 VIEW COMPARISON:  Portable chest 08/03/2018 and earlier. FINDINGS: Seated AP and lateral views of the chest. Stable cardiomegaly and mediastinal contours. Visualized tracheal air column is within normal limits. Stable lung volumes. No pneumothorax, pleural effusion or confluent pulmonary opacity. Calcified aortic atherosclerosis. Acute on chronic pulmonary interstitial opacity. No pleural fluid identified in the fissures on the lateral. Osteopenia.  Paucity of bowel gas in the upper abdomen. IMPRESSION: 1. Chronic cardiomegaly with acute on chronic pulmonary interstitial opacity. No pleural fluid identified. Consider pulmonary interstitial edema and viral/atypical respiratory infection. 2.  Aortic Atherosclerosis (ICD10-I70.0). Electronically Signed   By: Genevie Ann M.D.   On: 03/02/2019 06:57   US Renal  Result Date: 03/02/2019 CLINICAL  DATA:  Acute renal failure EXAM: RENAL / URINARY TRACT ULTRASOUND COMPLETE COMPARISON:  Renal ultrasound 07/05/2018 FINDINGS: Right Kidney: Renal measurements: 10.8 x 6.6 x 6.5 cm = volume: 243 mL . Echogenicity within normal limits. No mass or hydronephrosis visualized. Left Kidney: Renal measurements: 10.8 x 6.7 x 6.7 cm = volume: 254 mL. Echogenicity within normal limits. No mass or hydronephrosis visualized. Bladder: Appears normal for degree of bladder distention. IMPRESSION: Negative renal ultrasound. Electronically Signed   By: Franchot Gallo M.D.   On: 03/02/2019 16:50     CBC Recent Labs  Lab 03/03/19 0523  WBC 6.6  HGB 7.9*  HCT 25.7*  PLT 223  MCV 93.8  MCH 28.8  MCHC 30.7  RDW 14.6    Chemistries  Recent Labs  Lab 03/05/19 0551 03/06/19 0458 03/07/19 0319 03/08/19 1105 03/09/19 0419  NA 134* 136 134* 138 136  K 5.2* 5.7* 4.7 4.9 5.2*  CL 93* 95* 91* 93* 92*  CO2 31 32 33* 34* 36*  GLUCOSE 238* 395* 166* 249* 224*  BUN 79* 86* 85* 85* 91*  CREATININE 3.81* 3.51* 3.55* 3.80* 4.23*  CALCIUM 8.7* 9.0 9.1 9.5 9.0   ------------------------------------------------------------------------------------------------------------------ estimated creatinine clearance is 14.7 mL/min (A) (by C-G formula based on SCr of 4.23 mg/dL (H)). ------------------------------------------------------------------------------------------------------------------ No results for input(s): HGBA1C in the last 72 hours. ------------------------------------------------------------------------------------------------------------------ No results for input(s): CHOL, HDL, LDLCALC, TRIG, CHOLHDL, LDLDIRECT in the last 72 hours. ------------------------------------------------------------------------------------------------------------------ No results for input(s): TSH, T4TOTAL, T3FREE, THYROIDAB in the last 72 hours.  Invalid input(s):  FREET3 ------------------------------------------------------------------------------------------------------------------ No results for input(s): VITAMINB12, FOLATE, FERRITIN, TIBC, IRON, RETICCTPCT in the last 72 hours.  Coagulation profile No results for input(s): INR, PROTIME in the last 168 hours.  No results for input(s): DDIMER in the last 72 hours.  Cardiac Enzymes Recent Labs  Lab 03/02/19 1544 03/02/19 2211  TROPONINI 0.26* 0.31*   ------------------------------------------------------------------------------------------------------------------ Invalid input(s): POCBNP    Assessment & Plan   *Acute on chronic diastolic congestive heart failure Continue IV Lasix drip , patient with no improvement plan to start hemodialysis Vascular has been consulted  *Acute renal failure on CKD stage III Patient continues to have uremic type of symptoms with generalized weakness and fatigue needs plan to start dialysis per nephrology   *Hyperkalemia Stable  *Elevated troponin Due to demand ischemia  *Diabetes mellitus  Blood sugars elevated will adjust her insulin.  *Hyperlipidemia Continue statin.  *Hypertension Blood pressure stable       Code Status Orders  (From admission, onward)         Start     Ordered   03/02/19 1211  Full code  Continuous     03/02/19 1210        Code Status History    Date Active Date Inactive Code Status Order ID Comments User Context   08/04/2018 0556 08/07/2018 1951 DNR [id number]  Harrie Foreman, MD Inpatient   07/06/2018 1407 07/14/2018 1603 DNR [id number]  Vaughan Basta, MD Inpatient   07/04/2018 2200 07/06/2018 1407 Full Code [id number]  Demetrios Loll, MD Inpatient   04/22/2018 1702 04/26/2018 1851 DNR [id number]  Max Sane, MD Inpatient   02/21/2017 1629 02/24/2017 1747 DNR [id number]  Fritzi Mandes, MD Inpatient   01/31/2017 1617 02/04/2017 1350 DNR [id number]  Max Sane, MD Inpatient   01/23/2017 1320 01/31/2017 1617 Full  Code [id number]  Henreitta Leber, MD Inpatient   10/05/2016 1216 10/26/2016 1740 DNR [id number]  Knox Royalty, NP Inpatient   09/24/2016 1731 10/05/2016 1216  Full Code [id number]  Gladstone Lighter, MD Inpatient   09/01/2016 0217 09/05/2016 1628 Full Code [id number]  Harvie Bridge, DO Inpatient   08/09/2016 2027 08/12/2016 2015 Full Code [id number]  Henreitta Leber, MD Inpatient   09/30/2015 1423 10/07/2015 1737 Full Code [id number]  Loletha Grayer, MD ED   09/05/2015 1756 09/09/2015 1738 DNR [id number]  Loletha Grayer, MD ED    Advance Directive Documentation     Most Recent Value  Type of Advance Directive  Healthcare Power of [place], Living will  Pre-existing out of facility DNR order (yellow form or pink MOST form)  -  "MOST" Form in Place?  -           Consults cardiology  DVT Prophylaxis Heparin  Lab Results  Component Value Date   PLT 223 03/03/2019     Time Spent in minutes   35 minutes greater than 50% of time spent in care coordination and counseling patient regarding the condition and plan of care.   Dustin Flock M.D on 03/09/2019 at 1:27 PM  Between 7am to 6pm - Pager - [phone]  After 6pm go to [email or web address] - Proofreader  [employer]   Office  [phone]

## 2019-03-09 NOTE — Progress Notes (Signed)
Post HD Tx    03/09/19 2303  Hand-Off documentation  Report given to (Full Name) Hoyle Sauer, RN   Report received from (Full Name) Beatris Ship, RN   Vital Signs  Temp 98.4 F (36.9 C)  Temp Source Oral  Pulse Rate (!) 58  Pulse Rate Source Monitor  Resp (!) 22  BP (!) 130/42  BP Location Left Arm  BP Method Automatic  Patient Position (if appropriate) Lying  Oxygen Therapy  SpO2 100 %  O2 Device Nasal Cannula  O2 Flow Rate (L/min) 2 L/min  Pulse Oximetry Type Continuous  Pain Assessment  Pain Scale 0-10  Pain Score 0  Dialysis Weight  Weight 113.3 kg  Type of Weight Post-Dialysis

## 2019-03-09 NOTE — Care Management Important Message (Signed)
Important Message  Patient Details  Name: Carolyn Valdez MRN: [id number] Date of Birth: [birth year]   Medicare Important Message Given:  Yes    Dannette Barbara 03/09/2019, 11:30 AM

## 2019-03-09 NOTE — NC FL2 (Addendum)
[place] LEVEL OF CARE SCREENING TOOL     IDENTIFICATION  Patient Name: Carolyn Valdez: 03-05-1941 Sex: female Admission Date (Current Location): 03/02/2019  [facility] and [place] Number:  Engineering geologist and Address:  [facility], [address] [place], [place] [address]      Provider Number: [id number]  Attending Physician Name and Address:  Dustin Flock, MD  Relative Name and Phone Number:  Alain Marion Sister [phone]  [phone]     Current Level of Care: Domiciliary (Rest home) Recommended Level of Care: [place] Prior Approval Number:    Date Approved/Denied:   PASRR Number:    Discharge Plan: Domiciliary (Rest home)    Current Diagnoses: Patient Active Problem List   Diagnosis Date Noted  . Acute on chronic diastolic CHF (congestive heart failure) ([place]) 03/02/2019  . Acute renal failure superimposed on chronic kidney disease ([place]) 03/02/2019  . Anemia associated with chronic renal failure 10/25/2018  . Anemia 08/06/2018  . Acute hemorrhagic gastritis   . Stomach irritation   . Angiodysplasia of stomach and duodenum   . CKD (chronic kidney disease), stage III ([place]) 08/04/2018  . Hyperkalemia 07/04/2018  . Atrial fibrillation with RVR ([place]) 04/23/2018  . Syncope 04/22/2018  . Acute respiratory failure with hypoxia and hypercapnia ([place]) 02/21/2017  . Adjustment disorder with anxiety 01/28/2017  . Acute on chronic respiratory failure with hypoxia ([place]) 01/23/2017  . Palliative care encounter   . Goals of care, counseling/discussion   . DNR (do not resuscitate) 10/05/2016  . Palliative care by specialist 10/05/2016  . 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 Severe major depression, single episode, with psychotic features ([place]) 10/04/2016  . Cellulitis of leg, right 09/29/2016  . Proctitis 09/29/2016  . Hypercapnia 09/29/2016  . Acute urinary  retention 09/29/2016  . UTI (urinary tract infection) 09/29/2016  . Weakness 09/29/2016  . Subacute delirium 09/28/2016  . Gastrointestinal hemorrhage   . Diffuse abdominal pain   . Altered mental status 09/24/2016  . Pressure injury of skin 09/02/2016  . Respiratory failure with hypoxia ([place]) 09/01/2016  . Lymphedema 08/16/2016  . Acute on chronic heart failure with preserved ejection fraction (HFpEF) ([place]) 08/09/2016  . Acquired lymphedema of leg 04/21/2016  . Congestive heart failure ([place]) 11/25/2015  . Nocturia 11/05/2015  . Urinary frequency 11/05/2015  . Acute respiratory failure with hypoxia ([place]) 09/05/2015  . 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 Incontinence 04/20/2015  . Diabetes mellitus, type 2 ([place]) 04/17/2015  . ESBL (extended spectrum beta-lactamase) producing bacteria infection 04/17/2015  . Hypertension 04/17/2015  . Frequent UTI 04/17/2015  . Absence of bladder continence 04/17/2015  . Iron deficiency anemia 03/08/2015  . Symptomatic anemia 09/25/2014  . Abdominal pain, lower 09/25/2014  . Urge incontinence 02/19/2013  . FOM (frequency of micturition) 02/19/2013  . Bladder infection, chronic 08/11/2012  . Difficult or painful urination 07/26/2012  . Lower urinary tract infection 12/31/2011  . Diabetes mellitus ([place]) 12/31/2011    Orientation RESPIRATION BLADDER Height & Weight     Self, Time, Situation, Place  O2(3L oxygen) Incontinent Weight: 113.9 kg Height:  5\' 7"  (170.2 cm)  BEHAVIORAL SYMPTOMS/MOOD NEUROLOGICAL BOWEL NUTRITION STATUS      Continent Diet(CArb Modified 1600-2000)  AMBULATORY STATUS COMMUNICATION OF NEEDS Skin   Limited Assist Verbally Normal                       Personal Care Assistance Level of  Assistance  Bathing, Feeding, Dressing Bathing Assistance: Limited assistance Feeding assistance: Independent Dressing Assistance: Limited assistance     Functional Limitations Info  Sight, Hearing, Speech Sight Info: Adequate Hearing  Info: Adequate Speech Info: Adequate    SPECIAL CARE FACTORS FREQUENCY  PT (By licensed PT)(starting HD on 03-09-19)     PT Frequency: 5 x week              Contractures Contractures Info: Not present    Additional Factors Info  Code Status, Allergies Code Status Info: full Allergies Info: Sulfa Antibiotics, Biaxin Clarithromycin, Influenza A (H1n1) Monoval Vac, Morphine, Pyridium Phenazopyridine Hcl, Ceftriaxone, Latex, Prednisone, Tape             Discharge Medications: Please see discharge summary for a list of discharge medications.  TAKE these medications   acetaminophen 325 MG tablet Commonly known as:  TYLENOL Take 2 tablets (650 mg total) by mouth every 6 (six) hours as needed for mild pain (or Fever >/= 101).   albuterol 108 (90 Base) MCG/ACT inhaler Commonly known as:  VENTOLIN HFA Inhale 2 puffs into the lungs every 6 (six) hours as needed for wheezing or shortness of breath.   aspirin 81 MG chewable tablet Chew 1 tablet (81 mg total) by mouth daily. Start taking on:  March 20, 2019   atorvastatin 40 MG tablet Commonly known as:  LIPITOR Take 1 tablet (40 mg total) by mouth daily at 6 PM.   citalopram 10 MG tablet Commonly known as:  CELEXA Take 1 tablet (10 mg total) by mouth daily.   clonazePAM 0.25 MG disintegrating tablet Commonly known as:  KLONOPIN Take 1 tablet (0.25 mg total) by mouth every 12 (twelve) hours as needed (anxiety).   docusate sodium 100 MG capsule Commonly known as:  COLACE Take 100 mg by mouth 2 (two) times daily.   fluticasone 50 MCG/ACT nasal spray Commonly known as:  FLONASE Place 2 sprays into both nostrils daily.   glipiZIDE 5 MG tablet Commonly known as:  GLUCOTROL Take 5 mg by mouth daily before breakfast.   hydrALAZINE 25 MG tablet Commonly known as:  APRESOLINE Take 1 tablet (25 mg total) by mouth 3 (three) times daily with meals. What changed:    medication strength  how much to take  when  to take this   insulin aspart 100 UNIT/ML injection Commonly known as:  novoLOG 3 (three) times daily before meals. Inject per sliding scale 0-199=0, 200-500=5 units, notify MD for blood sugar> or equal to 500   insulin glargine 100 UNIT/ML injection Commonly known as:  LANTUS Inject 0.4 mLs (40 Units total) into the skin daily. What changed:  how much to take   iron polysaccharides 150 MG capsule Commonly known as:  NIFEREX Take 150 mg by mouth daily.   lamoTRIgine 25 MG tablet Commonly known as:  LAMICTAL Take 1 tablet (25 mg total) by mouth daily. What changed:  when to take this   levETIRAcetam 250 MG tablet Commonly known as:  KEPPRA Take 1 tablet (250 mg total) by mouth 2 (two) times daily.   loratadine 10 MG tablet Commonly known as:  CLARITIN Take 10 mg by mouth daily.   losartan 50 MG tablet Commonly known as:  COZAAR Take 50 mg by mouth daily.   Melatonin 3 MG Tabs Take 6 mg by mouth at bedtime.   metoprolol tartrate 50 MG tablet Commonly known as:  LOPRESSOR Take 50 mg by mouth 2 (two) times daily.  multivitamin tablet Take 1 tablet by mouth daily.   Myrbetriq 25 MG Tb24 tablet Generic drug:  mirabegron ER Take 25 mg by mouth daily.   nystatin powder Generic drug:  nystatin Apply topically. Apply to under breast and abdominal once daily   OLANZapine 10 MG tablet Commonly known as:  ZYPREXA Take 10 mg by mouth at bedtime.   ondansetron 8 MG disintegrating tablet Commonly known as:  ZOFRAN-ODT Take 8 mg by mouth every 8 (eight) hours as needed for nausea/vomiting.   oxyCODONE-acetaminophen 5-325 MG tablet Commonly known as:  PERCOCET/ROXICET Take 1 tablet by mouth every 6 (six) hours as needed for moderate pain or severe pain.   pantoprazole 40 MG tablet Commonly known as:  PROTONIX Take 40 mg by mouth 2 (two) times daily.   polyethylene glycol 17 g packet Commonly known as:  MIRALAX / GLYCOLAX Take 17 g by mouth daily.    saccharomyces boulardii 250 MG capsule Commonly known as:  FLORASTOR Take 250 mg by mouth daily.   sennosides-docusate sodium 8.6-50 MG tablet Commonly known as:  SENOKOT-S Take 1 tablet by mouth at bedtime.   torsemide 20 MG tablet Commonly known as:  DEMADEX Take 2 tablets (40 mg total) by mouth daily. What changed:    how much to take  when to take this   vitamin B-12 1000 MCG tablet Commonly known as:  CYANOCOBALAMIN Take 1,000 mcg by mouth daily.       Relevant Imaging Results:  Relevant Lab Results:   Additional Information ss: [id number]         Will be starting HD on 5-22  Elza Rafter, RN

## 2019-03-09 NOTE — Op Note (Signed)
  OPERATIVE NOTE   PROCEDURE: 1. Ultrasound guidance for vascular access right femoral vein 2. Placement of a 30 cm triple lumen dialysis catheter vein  PRE-OPERATIVE DIAGNOSIS: 1. Acute renal failure 2. CKD stage 4  POST-OPERATIVE DIAGNOSIS: Same  SURGEON: Leotis Pain, MD  ASSISTANT(S): None  ANESTHESIA: local  ESTIMATED BLOOD LOSS: Minimal   FINDING(S): 1. None  SPECIMEN(S): None  INDICATIONS:  Patient is a 78 y.o.female who presents with acute on chronic renal failure and needs a catheter to start dialysis.  Risks and benefits were discussed, and informed consent was obtained..  DESCRIPTION: After obtaining full informed written consent, the patient was laid flat in the bed. The right groin was sterilely prepped and draped in a sterile surgical field was created. The right femoral vein was visualized with ultrasound and found to be widely patent. It was then accessed under direct guidance without difficulty with a Seldinger needle and a permanent image was recorded. A J-wire was then placed. After skin nick and dilatation, a 30 cm triple lumen dialysis catheter was placed over the wire and the wire was removed. The lumens withdrew dark red nonpulsatile blood and flushed easily with sterile saline. The catheter was secured to the skin with 3 nylon sutures. Sterile dressing was placed.  COMPLICATIONS: None  CONDITION: Stable  Leotis Pain 03/09/2019 1:50 PM  This note was created with Dragon Medical transcription system. Any errors in dictation are purely unintentional.

## 2019-03-09 NOTE — Progress Notes (Signed)
[place], [place] 03/09/19  Subjective:  Patient continues to worsen.  She is a bit more lethargic today. However she is arousable and we did discuss the potential for renal placement therapy. She is agreeable to proceeding with this. BUN currently up to 91 with a creatinine of 4.23 and EGFR of 10. Objective:  Vital signs in last 24 hours:  Temp:  [97.8 F (36.6 C)-98.3 F (36.8 C)] 98 F (36.7 C) (05/22 0750) Pulse Rate:  [56-66] 62 (05/22 1058) Resp:  [20] 20 (05/22 0456) BP: (114-128)/(40-45) 127/45 (05/22 0750) SpO2:  [97 %-99 %] 98 % (05/22 0750)  Weight change:  Filed Weights   03/06/19 0304 03/07/19 0357 03/08/19 0401  Weight: 116.9 kg 118.3 kg 113.9 kg    Intake/Output:    Intake/Output Summary (Last 24 hours) at 03/09/2019 1334 Last data filed at 03/09/2019 1031 Gross per 24 hour  Intake 58.03 ml  Output 950 ml  Net -891.97 ml     Physical Exam: General:  Obese, no acute distress, laying in the bed  HEENT  [place] O2, moist oral mucous membranes, anicteric  Neck  supple  Pulm/lungs  CTAB normal effort  CVS/Heart  S1S2 no rubs  Abdomen:   Soft, obese, nontender  Extremities:  trace pitting edema  Neurologic:  Lethargic but arousable  Skin:  No acute rashes    External urinary catheter       Basic Metabolic Panel:  Recent Labs  Lab 03/03/19 0523 03/04/19 0507 03/05/19 0551 03/06/19 0458 03/07/19 0319 03/08/19 1105 03/09/19 0419  NA 140 140 134* 136 134* 138 136  K 5.0 5.0 5.2* 5.7* 4.7 4.9 5.2*  CL 103 102 93* 95* 91* 93* 92*  CO2 30 31 31  32 33* 34* 36*  GLUCOSE 96 146* 238* 395* 166* 249* 224*  BUN 69* 71* 79* 86* 85* 85* 91*  CREATININE 3.44* 3.44* 3.81* 3.51* 3.55* 3.80* 4.23*  CALCIUM 9.1 9.2 8.7* 9.0 9.1 9.5 9.0  PHOS 6.1* 5.7* 6.0* 5.1* 4.7*  --   --      CBC: Recent Labs  Lab 03/03/19 0523  WBC 6.6  HGB 7.9*  HCT 25.7*  MCV 93.8  PLT 223     No results found for: HEPBSAG, HEPBSAB,  HEPBIGM    Microbiology:  Recent Results (from the past 240 hour(s))  SARS Coronavirus 2 (CEPHEID - Performed in [facility] hospital lab), Hosp Order     Status: None   Collection Time: 03/02/19  6:35 AM  Result Value Ref Range Status   SARS Coronavirus 2 NEGATIVE NEGATIVE Final    Comment: (NOTE) If result is NEGATIVE SARS-CoV-2 target nucleic acids are NOT DETECTED. The SARS-CoV-2 RNA is generally detectable in upper and lower  respiratory specimens during the acute phase of infection. The lowest  concentration of SARS-CoV-2 viral copies this assay can detect is 250  copies / mL. A negative result does not preclude SARS-CoV-2 infection  and should not be used as the sole basis for treatment or other  patient management decisions.  A negative result may occur with  improper specimen collection / handling, submission of specimen other  than nasopharyngeal swab, presence of viral mutation(s) within the  areas targeted by this assay, and inadequate number of viral copies  (<250 copies / mL). A negative result must be combined with clinical  observations, patient history, and epidemiological information. If result is POSITIVE SARS-CoV-2 target nucleic acids are DETECTED. The SARS-CoV-2 RNA is generally detectable in  upper and lower  respiratory specimens dur ing the acute phase of infection.  Positive  results are indicative of active infection with SARS-CoV-2.  Clinical  correlation with patient history and other diagnostic information is  necessary to determine patient infection status.  Positive results do  not rule out bacterial infection or co-infection with other viruses. If result is PRESUMPTIVE POSTIVE SARS-CoV-2 nucleic acids MAY BE PRESENT.   A presumptive positive result was obtained on the submitted specimen  and confirmed on repeat testing.  While 2019 novel coronavirus  (SARS-CoV-2) nucleic acids may be present in the submitted sample  additional confirmatory  testing may be necessary for epidemiological  and / or clinical management purposes  to differentiate between  SARS-CoV-2 and other Sarbecovirus currently known to infect humans.  If clinically indicated additional testing with an alternate test  methodology [phone]) is advised. The SARS-CoV-2 RNA is generally  detectable in upper and lower respiratory sp ecimens during the acute  phase of infection. The expected result is Negative. Fact Sheet for Patients:  StrictlyIdeas.no Fact Sheet for Healthcare Providers: BankingDealers.co.za This test is not yet approved or cleared by the [place] FDA and has been authorized for detection and/or diagnosis of SARS-CoV-2 by FDA under an Emergency Use Authorization (EUA).  This EUA will remain in effect (meaning this test can be used) for the duration of the COVID-19 declaration under Section 564(b)(1) of the Act, 21 U.S.C. section 360bbb-3(b)(1), unless the authorization is terminated or revoked sooner. Performed at [facility], [address] [place], [place] [address]   MRSA PCR Screening     Status: None   Collection Time: 03/06/19  8:15 PM  Result Value Ref Range Status   MRSA by PCR NEGATIVE NEGATIVE Final    Comment:        The GeneXpert MRSA Assay (FDA approved for NASAL specimens only), is one component of a comprehensive MRSA colonization surveillance program. It is not intended to diagnose MRSA infection nor to guide or monitor treatment for MRSA infections. Performed at [facility], [address] [place], [place] [address]     Coagulation Studies: No results for input(s): LABPROT, INR in the last 72 hours.  Urinalysis: No results for input(s): COLORURINE, LABSPEC, PHURINE, GLUCOSEU, HGBUR, BILIRUBINUR, KETONESUR, PROTEINUR, UROBILINOGEN, NITRITE, LEUKOCYTESUR in the last 72 hours.  Invalid input(s): APPERANCEUR    Imaging: No results  found.   Medications:   . [MAR Hold] sodium chloride Stopped (03/07/19 1733)  . furosemide (LASIX) infusion 4 mg/hr (03/08/19 1835)   . [MAR Hold] aspirin  81 mg Oral Daily  . [MAR Hold] atorvastatin  40 mg Oral q1800  . [MAR Hold] citalopram  10 mg Oral Daily  . [MAR Hold] docusate sodium  100 mg Oral BID  . [MAR Hold] feeding supplement (ENSURE ENLIVE)  237 mL Oral BID BM  . [MAR Hold] fluticasone  2 spray Each Nare Daily  . [MAR Hold] heparin  5,000 Units Subcutaneous Q8H  . [MAR Hold] hydrALAZINE  25 mg Oral TID WC  . [MAR Hold] insulin aspart  0-5 Units Subcutaneous QHS  . [MAR Hold] insulin aspart  0-9 Units Subcutaneous TID WC  . [MAR Hold] insulin aspart  7 Units Subcutaneous TID WC  . [MAR Hold] insulin glargine  20 Units Subcutaneous Daily  . [MAR Hold] lamoTRIgine  25 mg Oral BID  . [MAR Hold] levETIRAcetam  250 mg Oral BID  . [MAR Hold] loratadine  10 mg Oral Daily  . [  MAR Hold] Melatonin  5 mg Oral QHS  . [MAR Hold] metoprolol tartrate  25 mg Oral BID  . [MAR Hold] multivitamin with minerals  1 tablet Oral Daily  . [MAR Hold] nystatin   Topical BID  . [MAR Hold] OLANZapine  10 mg Oral QHS  . [MAR Hold] pantoprazole  40 mg Oral BID  . [MAR Hold] polyethylene glycol  17 g Oral Daily  . [MAR Hold] senna-docusate  1 tablet Oral QHS  . [MAR Hold] sodium chloride flush  3 mL Intravenous Q12H  . [MAR Hold] vitamin B-12  1,000 mcg Oral Daily   [MAR Hold] sodium chloride, [MAR Hold] acetaminophen, [MAR Hold] albuterol, [MAR Hold] alum & mag hydroxide-simeth, [MAR Hold] clonazePAM, [MAR Hold] docusate sodium, [MAR Hold] ondansetron, [MAR Hold] oxyCODONE-acetaminophen, [MAR Hold] promethazine  Assessment/ Plan:  78 y.o. Caucasian female with with congestive heart failure , hypertension, diabetes mellitus type II, obstructive sleep apnea, nephrolithiasis   Principal Problem:   Acute on chronic diastolic CHF (congestive heart failure) (HCC) Active Problems:   Acute on  chronic heart failure with preserved ejection fraction (HFpEF) (HCC)   Acute renal failure superimposed on chronic kidney disease ([place])   #.  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   Last known outpatient creatinine from December 12 is 2.69/GFR 17 Underlying CKD likely secondary to atherosclerosis, diabetes and hypertension -Renal function continues to deteriorate.  Creatinine up to 4.23 with an EGFR down to 10.  She is still making urine but appears to be becoming uremic.  Therefore dialysis indicated.  We discussed this in detail with the patient.  We will request vascular surgery to place temporary dialysis catheter and then follow-up with her first dialysis treatment.  Recent Labs    03/06/19 0458 03/07/19 0319 03/08/19 1105 03/09/19 0419  CREATININE 3.51* 3.55* 3.80* 4.23*    #. Anemia of CKD  Lab Results  Component Value Date   HGB 7.9 (L) 03/03/2019  Hemoglobin 7.9.  Continue to monitor.  Consider Epogen once she has an established dialysis regimen.  #.  Hyperphosphatemia     Component Value Date/Time   PTH 51 03/04/2019 0507   Lab Results  Component Value Date   PHOS 4.7 (H) 03/07/2019  Phosphorus currently 4.7 and should come down with dialysis treatments.  #. Diabetes type 2 with CKD Hemoglobin A1C (%)  Date Value  09/04/2014 6.2   Hgb A1c MFr Bld (%)  Date Value  04/22/2018 7.6 (H)    #Grade 2 diastolic dysfunction with pulmonary hypertension, tricuspid regurgitation, generalized edema Creatinine clearance found to be 16 however creatinine clearance often overestimates renal function.  Begin volume removal with dialysis treatments as well.  Hyperkalemia. Potassium up to 5.2.  Decreased with dialysis treatment.   LOS: 7 Vestal Crandall 5/22/20201:34 PM  [place], [place]

## 2019-03-09 NOTE — Progress Notes (Signed)
Inpatient Diabetes Program Recommendations  AACE/ADA: New Consensus Statement on Inpatient Glycemic Control (2015)  Target Ranges:  Prepandial:   less than 140 mg/dL      Peak postprandial:   less than 180 mg/dL (1-2 hours)      Critically ill patients:  140 - 180 mg/dL    Results for TOI, STELLY (MRN [id number]) as of 03/09/2019 11:41  Ref. Range 03/08/2019 07:44 03/08/2019 11:49 03/08/2019 16:58 03/08/2019 21:14  Glucose-Capillary Latest Ref Range: 70 - 99 mg/dL 141 (H)  8 units NOVOLOG  216 (H)  10 units NOVOLOG  256 (H)  12 units NOVOLOG  237 (H)  2 units NOVOLOG +  20 units LANTUS   Results for SHAIVI, ROTHSCHILD (MRN [id number]) as of 03/09/2019 11:41  Ref. Range 03/09/2019 07:52 03/09/2019 11:37  Glucose-Capillary Latest Ref Range: 70 - 99 mg/dL 203 (H)  10 units NOVOLOG  193 (H)    Home DM Meds: Glipizide 5 mg Daily       Novolog 0-5 units TID per SSI       Lantus 55 units QHS  Current Orders: Lantus 20 units Daily      Novolog Sensitive Correction Scale/ SSI (0-9 units) TID AC + HS      Novolog 7 units TID with meals      MD- Please consider the following in-hospital insulin adjustments:  1. Increase Lantus slightly to 22 units Daily  2. Increase Novolog Meal Coverage to: Novolog 9 units TID with meals  (Please add the following Hold Parameters: Hold if pt eats <50% of meal, Hold if pt NPO)     --Will follow patient during hospitalization--  Wyn Quaker RN, MSN, CDE Diabetes Coordinator Inpatient Glycemic Control Team Team Pager: [phone] (8a-5p)

## 2019-03-09 NOTE — Progress Notes (Signed)
Pre HD [facility]   03/09/19 2123  Hand-Off documentation  Report given to (Full Name) Beatris Ship, RN   Report received from (Full Name) Hoyle Sauer, RN   Vital Signs  Temp 98.6 F (37 C)  Temp Source Oral  Pulse Rate 60  Pulse Rate Source Monitor  Resp 20  BP (!) 141/47  BP Location Left Arm  BP Method Automatic  Patient Position (if appropriate) Lying  Oxygen Therapy  SpO2 100 %  O2 Device Nasal Cannula  O2 Flow Rate (L/min) 2 L/min  Pulse Oximetry Type Continuous  Pain Assessment  Pain Scale 0-10  Pain Score 0  Dialysis Weight  Weight 113.6 kg  Type of Weight Pre-Dialysis  Time-Out for Hemodialysis  What Procedure? HD  Pt Identifiers(min of two) First/Last Name;MRN/Account#  Correct Site? Yes  Correct Side? Yes  Correct Procedure? Yes  Consents Verified? Yes  Rad Studies Available? N/A  Safety Precautions Reviewed? Yes  Engineer, civil (consulting) Number 3  Station Number 2  UF/Alarm Test Passed  Conductivity: Meter 13.8  Conductivity: Machine  13.7  pH 7.2  Reverse Osmosis Main  Normal Saline Lot Number [id number]  Dialyzer Lot Number [id number]  Disposable Set Lot Number [id number]  Machine Temperature 98.6 F (37 C)  Musician and Audible Yes  Blood Lines Intact and Secured Yes  Pre Treatment Patient Checks  Vascular access used during treatment Catheter  Hepatitis B Surface Antigen Results  (unknown)  Isolation Initiated Yes  Hepatitis B Surface Antibody  (unknown )  Date Hepatitis B Surface Antibody Drawn 03/09/19  Hemodialysis Consent Verified Yes  Hemodialysis Standing Orders Initiated Yes  ECG (Telemetry) Monitor On Yes  Prime Ordered Normal Saline  Length of  DialysisTreatment -hour(s) 1.5 Hour(s)  Dialysis Treatment Comments Na 140, BFR 150  Dialyzer Elisio 17H NR  Dialysate 2K, 2.5 Ca  Dialysis Anticoagulant None  Dialysate Flow Ordered 300  Blood Flow Rate Ordered 150 mL/min  Ultrafiltration Goal 0 Liters  Dialysis Blood Pressure Support  Ordered Normal Saline  Education / Care Plan  Dialysis Education Provided Yes  Documented Education in Care Plan Yes  Hemodialysis Catheter  Placement Date: 03/09/19    Site Condition No complications  Blue Lumen Status Blood return noted  Red Lumen Status Blood return noted  Purple Lumen Status Capped (Central line)  Dressing Type Gauze/Drain sponge;Occlusive  Dressing Status Clean;Dry;Intact  Dressing Change Due 03/10/19

## 2019-03-10 LAB — GLUCOSE, CAPILLARY
Glucose-Capillary: 243 mg/dL — ABNORMAL HIGH (ref 70–99)
Glucose-Capillary: 255 mg/dL — ABNORMAL HIGH (ref 70–99)
Glucose-Capillary: 162 mg/dL — ABNORMAL HIGH (ref 70–99)

## 2019-03-10 LAB — RENAL FUNCTION PANEL
Albumin: 3.1 g/dL — ABNORMAL LOW (ref 3.5–5.0)
Anion gap: 7 (ref 5–15)
BUN: 67 mg/dL — ABNORMAL HIGH (ref 8–23)
CO2: 35 mmol/L — ABNORMAL HIGH (ref 22–32)
Calcium: 9.1 mg/dL (ref 8.9–10.3)
Chloride: 97 mmol/L — ABNORMAL LOW (ref 98–111)
Creatinine, Ser: 2.99 mg/dL — ABNORMAL HIGH (ref 0.44–1.00)
GFR calc Af Amer: 17 mL/min — ABNORMAL LOW (ref >60)
GFR calc non Af Amer: 15 mL/min — ABNORMAL LOW (ref >60)
Glucose, Bld: 234 mg/dL — ABNORMAL HIGH (ref 70–99)
Phosphorus: 3.8 mg/dL (ref 2.5–4.6)
Potassium: 4.8 mmol/L (ref 3.5–5.1)
Sodium: 139 mmol/L (ref 135–145)

## 2019-03-10 LAB — HEPATITIS B CORE ANTIBODY, TOTAL: Hep B Core Total Ab: NEGATIVE

## 2019-03-10 LAB — HEPATITIS B SURFACE ANTIGEN: Hepatitis B Surface Ag: NEGATIVE

## 2019-03-10 LAB — HEPATITIS B SURFACE ANTIBODY,QUALITATIVE: Hep B S Ab: NONREACTIVE

## 2019-03-10 LAB — PHOSPHORUS: Phosphorus: 4.1 mg/dL (ref 2.5–4.6)

## 2019-03-10 LAB — NOVEL CORONAVIRUS, NAA (HOSP ORDER, SEND-OUT TO REF LAB; TAT 18-24 HRS): SARS-CoV-2, NAA: NOT DETECTED

## 2019-03-10 MED ORDER — INSULIN GLARGINE 100 UNIT/ML ~~LOC~~ SOLN
24.0000 [IU] | Freq: Every day | SUBCUTANEOUS | Status: DC
  Administered 2019-03-11 – 2019-03-13 (×3): 24 [IU] via SUBCUTANEOUS
  Filled 2019-03-10 (×6): qty 0.24

## 2019-03-10 NOTE — Progress Notes (Signed)
Pre HD Assessment   03/10/19 1630  Neurological  Level of Consciousness Alert  Orientation Level Oriented X4  Respiratory  Respiratory Pattern Dyspnea with exertion  Chest Assessment Chest expansion symmetrical  Bilateral Breath Sounds Diminished;Fine crackles  Cardiac  Pulse Regular  Heart Sounds Murmur  ECG Monitor Yes  Cardiac Rhythm NSR  Vascular  R Radial Pulse +2  L Radial Pulse +2  Generalized Edema +2  Integumentary  Integumentary (WDL) X  Skin Color Pale  Skin Condition Dry  Skin Integrity Abrasion;MASD  Additional Integumentary Comments  (HD pt)  Musculoskeletal  Musculoskeletal (WDL) X  Generalized Weakness Yes  Gastrointestinal  Bowel Sounds Assessment Active  GU Assessment  Genitourinary (WDL) X  Genitourinary Symptoms External catheter (HD pt)  Urine Characteristics  Urine Color Yellow/straw  Urine Appearance Cloudy  Psychosocial  Psychosocial (WDL) WDL

## 2019-03-10 NOTE — Progress Notes (Signed)
HD Tx Start   03/10/19 1630  Vital Signs  Pulse Rate 60  Pulse Rate Source Monitor  Resp 20  BP (!) 126/42  BP Location Left Arm  BP Method Automatic  Patient Position (if appropriate) Lying  Oxygen Therapy  SpO2 98 %  O2 Device Nasal Cannula  O2 Flow Rate (L/min) 3 L/min  During Hemodialysis Assessment  Blood Flow Rate (mL/min) 250 mL/min  Arterial Pressure (mmHg) -90 mmHg  Venous Pressure (mmHg) 70 mmHg  Transmembrane Pressure (mmHg) 60 mmHg  Ultrafiltration Rate (mL/min) 750 mL/min  Dialysate Flow Rate (mL/min) 500 ml/min  Conductivity: Machine  14  HD Safety Checks Performed Yes  Dialysis Fluid Bolus Normal Saline  Bolus Amount (mL) 250 mL  Intra-Hemodialysis Comments Tx initiated  Hemodialysis Catheter  Placement Date: 03/09/19    Site Condition No complications  Blue Lumen Status Infusing  Red Lumen Status Infusing  Dressing Type Other (Comment);Biopatch (tegaderm)  Dressing Status Antimicrobial disc changed;Dressing changed  Interventions New dressing

## 2019-03-10 NOTE — Progress Notes (Signed)
[place] [facility]                                                                                                                                                                                  Patient Demographics   Carolyn Valdez, is a 78 y.o. female, DOB - March 27, 1941, ZGY:[id number]  Admit date - 03/02/2019   Admitting Physician Vaughan Basta, MD  Outpatient Primary MD for the patient is [facility]   LOS - 8  Subjective: Patient much more awake today had her first dialysis yesterday   Review of Systems:   CONSTITUTIONAL: No documented fever.  Positive fatigue, positive weakness. No weight gain, no weight loss.  EYES: No blurry or double vision.  ENT: No tinnitus. No postnasal drip. No redness of the oropharynx.  RESPIRATORY: No cough, no wheeze, no hemoptysis.  Positive dyspnea.  CARDIOVASCULAR: No chest pain. No orthopnea. No palpitations. No syncope.  GASTROINTESTINAL: Positive nausea, no vomiting or diarrhea. No abdominal pain. No melena or hematochezia.  GENITOURINARY: No dysuria or hematuria.  ENDOCRINE: No polyuria or nocturia. No heat or cold intolerance.  HEMATOLOGY: No anemia. No bruising. No bleeding.  INTEGUMENTARY: No rashes. No lesions.  MUSCULOSKELETAL: No arthritis. No swelling. No gout.  Positive back pain NEUROLOGIC: No numbness, tingling, or ataxia. No seizure-type activity.  PSYCHIATRIC: No anxiety. No insomnia. No ADD.    Vitals:   Vitals:   03/09/19 2347 03/10/19 0454 03/10/19 0500 03/10/19 0801  BP: (!) 128/43 (!) 128/42  (!) 148/51  Pulse: (!) 59 (!) 57  65  Resp: 18 16  16   Temp: 98.6 F (37 C) 98.1 F (36.7 C)  97.8 F (36.6 C)  TempSrc: Oral Oral  Oral  SpO2: 98% 98%  94%  Weight:   116.6 kg   Height:        Wt Readings from Last 3 Encounters:  03/10/19 116.6 kg  10/23/18 112.9 kg  09/28/18 113.2 kg     Intake/Output Summary (Last 24 hours) at 03/10/2019 1213 Last data filed at  03/10/2019 1114 Gross per 24 hour  Intake 600 ml  Output 900 ml  Net -300 ml    Physical Exam:   GENERAL: Pleasant-appearing in no apparent distress.  HEAD, EYES, EARS, NOSE AND THROAT: Atraumatic, normocephalic. Extraocular muscles are intact. Pupils equal and reactive to light. Sclerae anicteric. No conjunctival injection. No oro-pharyngeal erythema.  NECK: Supple. There is no jugular venous distention. No bruits, no lymphadenopathy, no thyromegaly.  HEART: Regular rate and rhythm,. No murmurs, no rubs, no clicks.  LUNGS: Bilateral crackles throughout both lung.  ABDOMEN: Soft, flat, nontender, nondistended. Has good bowel sounds. No hepatosplenomegaly  appreciated.  EXTREMITIES: No evidence of any cyanosis, clubbing, or peripheral edema.  +2 pedal and radial pulses bilaterally.  NEUROLOGIC: The patient is alert, awake, and oriented x3 with no focal motor or sensory deficits appreciated bilaterally.  SKIN: Moist and warm with no rashes appreciated.  Psych: Not anxious, depressed LN: No inguinal LN enlargement    Antibiotics   Anti-infectives (From admission, onward)   None      Medications   Scheduled Meds: . aspirin  81 mg Oral Daily  . atorvastatin  40 mg Oral q1800  . Chlorhexidine Gluconate Cloth  6 each Topical Q0600  . citalopram  10 mg Oral Daily  . docusate sodium  100 mg Oral BID  . feeding supplement (ENSURE ENLIVE)  237 mL Oral BID BM  . fluticasone  2 spray Each Nare Daily  . heparin  5,000 Units Subcutaneous Q8H  . hydrALAZINE  25 mg Oral TID WC  . insulin aspart  0-5 Units Subcutaneous QHS  . insulin aspart  0-9 Units Subcutaneous TID WC  . insulin aspart  7 Units Subcutaneous TID WC  . insulin glargine  20 Units Subcutaneous Daily  . lamoTRIgine  25 mg Oral BID  . levETIRAcetam  250 mg Oral BID  . loratadine  10 mg Oral Daily  . Melatonin  5 mg Oral QHS  . metoprolol tartrate  25 mg Oral BID  . multivitamin with minerals  1 tablet Oral Daily  .  nystatin   Topical BID  . OLANZapine  10 mg Oral QHS  . pantoprazole  40 mg Oral BID  . polyethylene glycol  17 g Oral Daily  . senna-docusate  1 tablet Oral QHS  . sodium chloride flush  3 mL Intravenous Q12H  . tuberculin  5 Units Intradermal Once  . vitamin B-12  1,000 mcg Oral Daily   Continuous Infusions: . sodium chloride Stopped (03/07/19 1733)  . furosemide (LASIX) infusion 4 mg/hr (03/08/19 1835)   PRN Meds:.sodium chloride, acetaminophen, albuterol, alum & mag hydroxide-simeth, clonazePAM, docusate sodium, ondansetron, oxyCODONE-acetaminophen, promethazine   Data Review:   Micro Results Recent Results (from the past 240 hour(s))  SARS Coronavirus 2 (CEPHEID - Performed in [place] hospital lab), Hosp Order     Status: None   Collection Time: 03/02/19  6:35 AM  Result Value Ref Range Status   SARS Coronavirus 2 NEGATIVE NEGATIVE Final    Comment: (NOTE) If result is NEGATIVE SARS-CoV-2 target nucleic acids are NOT DETECTED. The SARS-CoV-2 RNA is generally detectable in upper and lower  respiratory specimens during the acute phase of infection. The lowest  concentration of SARS-CoV-2 viral copies this assay can detect is 250  copies / mL. A negative result does not preclude SARS-CoV-2 infection  and should not be used as the sole basis for treatment or other  patient management decisions.  A negative result may occur with  improper specimen collection / handling, submission of specimen other  than nasopharyngeal swab, presence of viral mutation(s) within the  areas targeted by this assay, and inadequate number of viral copies  (<250 copies / mL). A negative result must be combined with clinical  observations, patient history, and epidemiological information. If result is POSITIVE SARS-CoV-2 target nucleic acids are DETECTED. The SARS-CoV-2 RNA is generally detectable in upper and lower  respiratory specimens dur ing the acute phase of infection.  Positive   results are indicative of active infection with SARS-CoV-2.  Clinical  correlation with patient history and other diagnostic  information is  necessary to determine patient infection status.  Positive results do  not rule out bacterial infection or co-infection with other viruses. If result is PRESUMPTIVE POSTIVE SARS-CoV-2 nucleic acids MAY BE PRESENT.   A presumptive positive result was obtained on the submitted specimen  and confirmed on repeat testing.  While 2019 novel coronavirus  (SARS-CoV-2) nucleic acids may be present in the submitted sample  additional confirmatory testing may be necessary for epidemiological  and / or clinical management purposes  to differentiate between  SARS-CoV-2 and other Sarbecovirus currently known to infect humans.  If clinically indicated additional testing with an alternate test  methodology [phone]) is advised. The SARS-CoV-2 RNA is generally  detectable in upper and lower respiratory sp ecimens during the acute  phase of infection. The expected result is Negative. Fact Sheet for Patients:  StrictlyIdeas.no Fact Sheet for Healthcare Providers: BankingDealers.co.za This test is not yet approved or cleared by the [place] FDA and has been authorized for detection and/or diagnosis of SARS-CoV-2 by FDA under an Emergency Use Authorization (EUA).  This EUA will remain in effect (meaning this test can be used) for the duration of the COVID-19 declaration under Section 564(b)(1) of the Act, 21 U.S.C. section 360bbb-3(b)(1), unless the authorization is terminated or revoked sooner. Performed at [facility], [address] [place], [place] [address]   MRSA PCR Screening     Status: None   Collection Time: 03/06/19  8:15 PM  Result Value Ref Range Status   MRSA by PCR NEGATIVE NEGATIVE Final    Comment:        The GeneXpert MRSA Assay (FDA approved for NASAL specimens only), is one  component of a comprehensive MRSA colonization surveillance program. It is not intended to diagnose MRSA infection nor to guide or monitor treatment for MRSA infections. Performed at [facility], [address], [place], [place] [address]     Radiology Reports Dg Chest 2 View  Result Date: 03/03/2019 CLINICAL DATA:  78 year old female with a known history of diastolic congestive heart failure. EXAM: CHEST - 2 VIEW COMPARISON:  Mar 02, 2019 FINDINGS: Stable cardiomegaly. The hila and mediastinum are normal. No pulmonary nodules, masses, focal infiltrates, or overt edema. IMPRESSION: Cardiomegaly.  No acute abnormalities. Electronically Signed   By: Dorise Bullion III M.D   On: 03/03/2019 08:42   Dg Chest 2 View  Result Date: 03/02/2019 CLINICAL DATA:  78 year old female with shortness of breath for 2 days. Former smoker. EXAM: CHEST - 2 VIEW COMPARISON:  Portable chest 08/03/2018 and earlier. FINDINGS: Seated AP and lateral views of the chest. Stable cardiomegaly and mediastinal contours. Visualized tracheal air column is within normal limits. Stable lung volumes. No pneumothorax, pleural effusion or confluent pulmonary opacity. Calcified aortic atherosclerosis. Acute on chronic pulmonary interstitial opacity. No pleural fluid identified in the fissures on the lateral. Osteopenia.  Paucity of bowel gas in the upper abdomen. IMPRESSION: 1. Chronic cardiomegaly with acute on chronic pulmonary interstitial opacity. No pleural fluid identified. Consider pulmonary interstitial edema and viral/atypical respiratory infection. 2.  Aortic Atherosclerosis (ICD10-I70.0). Electronically Signed   By: Genevie Ann M.D.   On: 03/02/2019 06:57   US Renal  Result Date: 03/02/2019 CLINICAL DATA:  Acute renal failure EXAM: RENAL / URINARY TRACT ULTRASOUND COMPLETE COMPARISON:  Renal ultrasound 07/05/2018 FINDINGS: Right Kidney: Renal measurements: 10.8 x 6.6 x 6.5 cm = volume: 243 mL . Echogenicity within  normal limits. No mass or hydronephrosis visualized. Left Kidney: Renal measurements:  10.8 x 6.7 x 6.7 cm = volume: 254 mL. Echogenicity within normal limits. No mass or hydronephrosis visualized. Bladder: Appears normal for degree of bladder distention. IMPRESSION: Negative renal ultrasound. Electronically Signed   By: Franchot Gallo M.D.   On: 03/02/2019 16:50     CBC No results for input(s): WBC, HGB, HCT, PLT, MCV, MCH, MCHC, RDW, LYMPHSABS, MONOABS, EOSABS, BASOSABS, BANDABS in the last 168 hours.  Invalid input(s): NEUTRABS, BANDSABD  Chemistries  Recent Labs  Lab 03/06/19 0458 03/07/19 0319 03/08/19 1105 03/09/19 0419 03/10/19 0611  NA 136 134* 138 136 139  K 5.7* 4.7 4.9 5.2* 4.8  CL 95* 91* 93* 92* 97*  CO2 32 33* 34* 36* 35*  GLUCOSE 395* 166* 249* 224* 234*  BUN 86* 85* 85* 91* 67*  CREATININE 3.51* 3.55* 3.80* 4.23* 2.99*  CALCIUM 9.0 9.1 9.5 9.0 9.1   ------------------------------------------------------------------------------------------------------------------ estimated creatinine clearance is 21.1 mL/min (A) (by C-G formula based on SCr of 2.99 mg/dL (H)). ------------------------------------------------------------------------------------------------------------------ No results for input(s): HGBA1C in the last 72 hours. ------------------------------------------------------------------------------------------------------------------ No results for input(s): CHOL, HDL, LDLCALC, TRIG, CHOLHDL, LDLDIRECT in the last 72 hours. ------------------------------------------------------------------------------------------------------------------ No results for input(s): TSH, T4TOTAL, T3FREE, THYROIDAB in the last 72 hours.  Invalid input(s): FREET3 ------------------------------------------------------------------------------------------------------------------ No results for input(s): VITAMINB12, FOLATE, FERRITIN, TIBC, IRON, RETICCTPCT in the last 72  hours.  Coagulation profile No results for input(s): INR, PROTIME in the last 168 hours.  No results for input(s): DDIMER in the last 72 hours.  Cardiac Enzymes No results for input(s): CKMB, TROPONINI, MYOGLOBIN in the last 168 hours.  Invalid input(s): CK ------------------------------------------------------------------------------------------------------------------ Invalid input(s): POCBNP    Assessment & Plan   *Acute on chronic diastolic congestive heart failure Continue IV Lasix drip , doing better with dialysis   *Acute renal failure on CKD stage III With hemodialysis uremic symptoms improved   *Hyperkalemia Stable  *Elevated troponin Due to demand ischemia  *Diabetes mellitus  Continues to be elevated will adjust her insulin  *Hyperlipidemia Continue statin.  *Hypertension Blood pressure stable       Code Status Orders  (From admission, onward)         Start     Ordered   03/02/19 1211  Full code  Continuous     03/02/19 1210        Code Status History    Date Active Date Inactive Code Status Order ID Comments User Context   08/04/2018 0556 08/07/2018 1951 DNR [id number]  Harrie Foreman, MD Inpatient   07/06/2018 1407 07/14/2018 1603 DNR [id number]  Vaughan Basta, MD Inpatient   07/04/2018 2200 07/06/2018 1407 Full Code [id number]  Demetrios Loll, MD Inpatient   04/22/2018 1702 04/26/2018 1851 DNR [id number]  Max Sane, MD Inpatient   02/21/2017 1629 02/24/2017 1747 DNR [id number]  Fritzi Mandes, MD Inpatient   01/31/2017 1617 02/04/2017 1350 DNR [id number]  Max Sane, MD Inpatient   01/23/2017 1320 01/31/2017 1617 Full Code [id number]  Henreitta Leber, MD Inpatient   10/05/2016 1216 10/26/2016 1740 DNR [id number]  Knox Royalty, NP Inpatient   09/24/2016 1731 10/05/2016 1216 Full Code [id number]  Gladstone Lighter, MD Inpatient   09/01/2016 0217 09/05/2016 1628 Full Code [id number]  Hugelmeyer, [place], DO Inpatient   08/09/2016 2027 08/12/2016 2015  Full Code [id number]  Henreitta Leber, MD Inpatient   09/30/2015 1423 10/07/2015 1737 Full Code [id number]  Loletha Grayer, MD ED   09/05/2015 1756 09/09/2015 1738 DNR [id number]  Loletha Grayer, MD ED    Advance  Directive Documentation     Most Recent Value  Type of Advance Directive  Healthcare Power of Attorney, Living will  Pre-existing out of facility DNR order (yellow form or pink MOST form)  -  "MOST" Form in Place?  -           Consults cardiology  DVT Prophylaxis Heparin  Lab Results  Component Value Date   PLT 223 03/03/2019     Time Spent in minutes   35 minutes greater than 50% of time spent in care coordination and counseling patient regarding the condition and plan of care.   Dustin Flock M.D on 03/10/2019 at 12:13 PM  Between 7am to 6pm - Pager - [phone]  After 6pm go to [email or web address] - Proofreader  [employer]   Office  [phone]

## 2019-03-10 NOTE — Progress Notes (Signed)
Post HD Assessment    03/10/19 1904  Neurological  Level of Consciousness Alert  Orientation Level Oriented X4  Respiratory  Respiratory Pattern Dyspnea with exertion  Chest Assessment Chest expansion symmetrical  Bilateral Breath Sounds Diminished;Fine crackles  Cardiac  Pulse Regular  Heart Sounds Murmur  ECG Monitor Yes  Cardiac Rhythm NSR  Vascular  R Radial Pulse +2  L Radial Pulse +2  Generalized Edema +2  Integumentary  Integumentary (WDL) X  Skin Color Pale  Skin Condition Dry  Skin Integrity Abrasion;MASD  Musculoskeletal  Musculoskeletal (WDL) X  Generalized Weakness Yes  Gastrointestinal  Bowel Sounds Assessment Active  GU Assessment  Genitourinary (WDL) X  Genitourinary Symptoms External catheter (HD pt)  Urine Characteristics  Urine Color Yellow/straw  Urine Appearance Cloudy  Psychosocial  Psychosocial (WDL) WDL

## 2019-03-10 NOTE — Progress Notes (Signed)
HD Tx End  1 liter of fluid removed, tolerated well. Trialysis cath dressing changed.     03/10/19 1900  Vital Signs  Pulse Rate (!) 55  Resp (!) 22  BP (!) 122/41  Oxygen Therapy  SpO2 99 %  During Hemodialysis Assessment  Blood Flow Rate (mL/min) 250 mL/min  Arterial Pressure (mmHg) -100 mmHg  Venous Pressure (mmHg) 80 mmHg  Transmembrane Pressure (mmHg) 60 mmHg  Ultrafiltration Rate (mL/min) 750 mL/min  Dialysate Flow Rate (mL/min) 500 ml/min  Conductivity: Machine  14  HD Safety Checks Performed Yes  Dialysis Fluid Bolus Normal Saline  Bolus Amount (mL) 250 mL  Intra-Hemodialysis Comments Tx completed

## 2019-03-10 NOTE — Progress Notes (Signed)
Pre HD Tx   03/10/19 1615  Hand-Off documentation  Report given to (Full Name) Trellis Paganini RN  Report received from (Full Name) Bethel Born RN 2A  Vital Signs  Temp 98.2 F (36.8 C)  Temp Source Oral  Pulse Rate (!) 58  Pulse Rate Source Monitor  Resp (!) 22  BP (!) 128/47  BP Location Left Arm  BP Method Automatic  Patient Position (if appropriate) Lying  Oxygen Therapy  SpO2 98 %  O2 Device Nasal Cannula  O2 Flow Rate (L/min) 3 L/min  Pain Assessment  Pain Scale 0-10  Pain Score 5  Pain Type Chronic pain  Pain Location Back  Pain Intervention(s) Repositioned;Rest  Dialysis Weight  Weight 116.6 kg  Type of Weight Pre-Dialysis  Time-Out for Hemodialysis  What Procedure? Hemodialysis   Pt Identifiers(min of two) First/Last Name;MRN/Account#  Correct Site? Yes  Correct Side? Yes  Correct Procedure? Yes  Consents Verified? Yes  Rad Studies Available? N/A  Safety Precautions Reviewed? Yes  Engineer, civil (consulting) Number 4  Station Number 4  UF/Alarm Test Passed  Conductivity: Meter 14  Conductivity: Machine  13.8  pH 7.2  Reverse Osmosis main  Normal Saline Lot Number [id number]  Dialyzer Lot Number [id number]  Disposable Set Lot Number [id number]  Machine Temperature 98.6 F (37 C)  Musician and Audible Yes  Blood Lines Intact and Secured Yes  Pre Treatment Patient Checks  Vascular access used during treatment Catheter  Patient is receiving dialysis in a chair Yes  Hepatitis B Surface Antigen Results Negative  Date Hepatitis B Surface Antigen Drawn 03/09/19  Hepatitis B Surface Antibody  (<10)  Date Hepatitis B Surface Antibody Drawn 03/09/19  Hemodialysis Consent Verified Yes  Hemodialysis Standing Orders Initiated Yes  ECG (Telemetry) Monitor On Yes  Prime Ordered Normal Saline  Length of  DialysisTreatment -hour(s) 2.5 Hour(s)  Dialysis Treatment Comments Na 140  Dialyzer Elisio 17H NR  Dialysate 2K, 2.5 Ca  Dialysate Flow Ordered 500  Blood Flow  Rate Ordered 250 mL/min  Ultrafiltration Goal 0.5 Liters  Pre Treatment Labs Phosphorus  Dialysis Blood Pressure Support Ordered Normal Saline  Education / Care Plan  Dialysis Education Provided Yes  Documented Education in Care Plan Yes  Hemodialysis Catheter  Placement Date: 03/09/19    Site Condition No complications  Blue Lumen Status Capped (Central line)  Red Lumen Status Capped (Central line)  Dressing Type Gauze/Drain sponge;Occlusive  Dressing Status Clean;Dry;Intact  Interventions Dressing changed;New dressing  Drainage Description None  Dressing Change Due 03/17/19

## 2019-03-10 NOTE — Progress Notes (Signed)
[place], [place] 03/10/19  Subjective:  Patient underwent hemodialysis yesterday. Tolerated first treatment well. Due for second treatment today.   Objective:  Vital signs in last 24 hours:  Temp:  [97.8 F (36.6 C)-98.6 F (37 C)] 97.8 F (36.6 C) (05/23 0801) Pulse Rate:  [54-65] 65 (05/23 0801) Resp:  [16-23] 16 (05/23 0801) BP: (126-153)/(40-54) 148/51 (05/23 0801) SpO2:  [94 %-100 %] 94 % (05/23 0801) Weight:  [113.3 kg-116.6 kg] 116.6 kg (05/23 0500)  Weight change:  Filed Weights   03/09/19 2123 03/09/19 2303 03/10/19 0500  Weight: 113.6 kg 113.3 kg 116.6 kg    Intake/Output:    Intake/Output Summary (Last 24 hours) at 03/10/2019 1354 Last data filed at 03/10/2019 1255 Gross per 24 hour  Intake 360 ml  Output 1600 ml  Net -1240 ml     Physical Exam: General:  Obese, no acute distress, laying in the bed  HEENT  [place] O2, moist oral mucous membranes, anicteric  Neck  supple  Pulm/lungs  CTAB normal effort  CVS/Heart  S1S2 no rubs  Abdomen:   Soft, obese, nontender  Extremities:  trace pitting edema  Neurologic:  Awake, alert  Skin:  No acute rashes    External urinary catheter       Basic Metabolic Panel:  Recent Labs  Lab 03/05/19 0551 03/06/19 0458 03/07/19 0319 03/08/19 1105 03/09/19 0419 03/10/19 0611  NA 134* 136 134* 138 136 139  K 5.2* 5.7* 4.7 4.9 5.2* 4.8  CL 93* 95* 91* 93* 92* 97*  CO2 31 32 33* 34* 36* 35*  GLUCOSE 238* 395* 166* 249* 224* 234*  BUN 79* 86* 85* 85* 91* 67*  CREATININE 3.81* 3.51* 3.55* 3.80* 4.23* 2.99*  CALCIUM 8.7* 9.0 9.1 9.5 9.0 9.1  PHOS 6.0* 5.1* 4.7*  --  4.5 3.8     CBC: No results for input(s): WBC, NEUTROABS, HGB, HCT, MCV, PLT in the last 168 hours.    Lab Results  Component Value Date   HEPBSAG Negative 03/09/2019   HEPBSAB Non Reactive 03/09/2019      Microbiology:  Recent Results (from the past 240 hour(s))  SARS Coronavirus 2 (CEPHEID - Performed in  [facility] hospital lab), Hosp Order     Status: None   Collection Time: 03/02/19  6:35 AM  Result Value Ref Range Status   SARS Coronavirus 2 NEGATIVE NEGATIVE Final    Comment: (NOTE) If result is NEGATIVE SARS-CoV-2 target nucleic acids are NOT DETECTED. The SARS-CoV-2 RNA is generally detectable in upper and lower  respiratory specimens during the acute phase of infection. The lowest  concentration of SARS-CoV-2 viral copies this assay can detect is 250  copies / mL. A negative result does not preclude SARS-CoV-2 infection  and should not be used as the sole basis for treatment or other  patient management decisions.  A negative result may occur with  improper specimen collection / handling, submission of specimen other  than nasopharyngeal swab, presence of viral mutation(s) within the  areas targeted by this assay, and inadequate number of viral copies  (<250 copies / mL). A negative result must be combined with clinical  observations, patient history, and epidemiological information. If result is POSITIVE SARS-CoV-2 target nucleic acids are DETECTED. The SARS-CoV-2 RNA is generally detectable in upper and lower  respiratory specimens dur ing the acute phase of infection.  Positive  results are indicative of active infection with SARS-CoV-2.  Clinical  correlation with patient history and  other diagnostic information is  necessary to determine patient infection status.  Positive results do  not rule out bacterial infection or co-infection with other viruses. If result is PRESUMPTIVE POSTIVE SARS-CoV-2 nucleic acids MAY BE PRESENT.   A presumptive positive result was obtained on the submitted specimen  and confirmed on repeat testing.  While 2019 novel coronavirus  (SARS-CoV-2) nucleic acids may be present in the submitted sample  additional confirmatory testing may be necessary for epidemiological  and / or clinical management purposes  to differentiate between  SARS-CoV-2  and other Sarbecovirus currently known to infect humans.  If clinically indicated additional testing with an alternate test  methodology [phone]) is advised. The SARS-CoV-2 RNA is generally  detectable in upper and lower respiratory sp ecimens during the acute  phase of infection. The expected result is Negative. Fact Sheet for Patients:  StrictlyIdeas.no Fact Sheet for Healthcare Providers: BankingDealers.co.za This test is not yet approved or cleared by the [place] FDA and has been authorized for detection and/or diagnosis of SARS-CoV-2 by FDA under an Emergency Use Authorization (EUA).  This EUA will remain in effect (meaning this test can be used) for the duration of the COVID-19 declaration under Section 564(b)(1) of the Act, 21 U.S.C. section 360bbb-3(b)(1), unless the authorization is terminated or revoked sooner. Performed at [facility], [address] [place], [place] [address]   MRSA PCR Screening     Status: None   Collection Time: 03/06/19  8:15 PM  Result Value Ref Range Status   MRSA by PCR NEGATIVE NEGATIVE Final    Comment:        The GeneXpert MRSA Assay (FDA approved for NASAL specimens only), is one component of a comprehensive MRSA colonization surveillance program. It is not intended to diagnose MRSA infection nor to guide or monitor treatment for MRSA infections. Performed at [facility], [address] [place], [place] [address]   Novel Coronavirus, NAA (hospital order; send-out to ref lab)     Status: None   Collection Time: 03/08/19  6:34 PM  Result Value Ref Range Status   SARS-CoV-2, NAA NOT DETECTED NOT DETECTED Final    Comment: (NOTE) This test was developed and its performance characteristics determined by [employer]. This test has not been FDA cleared or approved. This test has been authorized by FDA under an Emergency Use Authorization (EUA). This test  is only authorized for the duration of time the declaration that circumstances exist justifying the authorization of the emergency use of in vitro diagnostic tests for detection of SARS-CoV-2 virus and/or diagnosis of COVID-19 infection under section 564(b)(1) of the Act, 21 U.S.C. 458KDX-8(P)(3), unless the authorization is terminated or revoked sooner. When diagnostic testing is negative, the possibility of a false negative result should be considered in the context of a patient's recent exposures and the presence of clinical signs and symptoms consistent with COVID-19. An individual without symptoms of COVID-19 and who is not shedding SARS-CoV-2 virus would expect to have a negative (not detected) result in this assay. Performed  At: [facility] [place], [place] [id number] Rush Farmer MD BH:[id number]    [place]  Final    Comment: Performed at [facility], [address] [place],  [address]    Coagulation Studies: No results for input(s): LABPROT, INR in the last 72 hours.  Urinalysis: No results for input(s): COLORURINE, LABSPEC, PHURINE, GLUCOSEU, HGBUR, BILIRUBINUR, KETONESUR, PROTEINUR, UROBILINOGEN, NITRITE, LEUKOCYTESUR in the last 72 hours.  Invalid input(s): APPERANCEUR    Imaging: No results found.   Medications:   . sodium chloride Stopped (03/07/19 1733)  . furosemide (LASIX) infusion 4 mg/hr (03/08/19 1835)   . aspirin  81 mg Oral Daily  . atorvastatin  40 mg Oral q1800  . Chlorhexidine Gluconate Cloth  6 each Topical Q0600  . citalopram  10 mg Oral Daily  . docusate sodium  100 mg Oral BID  . feeding supplement (ENSURE ENLIVE)  237 mL Oral BID BM  . fluticasone  2 spray Each Nare Daily  . heparin  5,000 Units Subcutaneous Q8H  . hydrALAZINE  25 mg Oral TID WC  . insulin aspart  0-5 Units Subcutaneous QHS  . insulin aspart  0-9 Units Subcutaneous TID WC  . insulin aspart  7 Units  Subcutaneous TID WC  . insulin glargine  24 Units Subcutaneous Daily  . lamoTRIgine  25 mg Oral BID  . levETIRAcetam  250 mg Oral BID  . loratadine  10 mg Oral Daily  . Melatonin  5 mg Oral QHS  . metoprolol tartrate  25 mg Oral BID  . multivitamin with minerals  1 tablet Oral Daily  . nystatin   Topical BID  . OLANZapine  10 mg Oral QHS  . pantoprazole  40 mg Oral BID  . polyethylene glycol  17 g Oral Daily  . senna-docusate  1 tablet Oral QHS  . sodium chloride flush  3 mL Intravenous Q12H  . tuberculin  5 Units Intradermal Once  . vitamin B-12  1,000 mcg Oral Daily   sodium chloride, acetaminophen, albuterol, alum & mag hydroxide-simeth, clonazePAM, docusate sodium, ondansetron, oxyCODONE-acetaminophen, promethazine  Assessment/ Plan:  78 y.o. 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   Last known outpatient creatinine from December 12 is 2.69/GFR 17 Underlying CKD likely secondary to atherosclerosis, diabetes and hypertension -Patient underwent first dialysis treatment yesterday.  Tolerated well.  We will plan for second dialysis treatment today.  Orders have been prepared.  Recent Labs    03/07/19 0319 03/08/19 1105 03/09/19 0419 03/10/19 0611  CREATININE 3.55* 3.80* 4.23* 2.99*    #. Anemia of CKD  Lab Results  Component Value Date   HGB 7.9 (L) 03/03/2019  Hemoglobin 7.9.  Continue to monitor.  Consider Epogen once she has an established dialysis regimen.  #.  Hyperphosphatemia     Component Value Date/Time   PTH 51 03/04/2019 0507   Lab Results  Component  Value Date   PHOS 3.8 03/10/2019  Phosphorus down to 3.8 with dialysis.  Continue to monitor.  #. Diabetes type 2 with CKD Hemoglobin A1C (%)  Date Value  09/04/2014 6.2   Hgb A1c MFr Bld (%)  Date Value  04/22/2018 7.6 (H)    #Grade 2 diastolic dysfunction with pulmonary hypertension, tricuspid regurgitation, generalized edema Begin volume removal with dialysis treatment today.  Hyperkalemia. Potassium down to 4.8 with dialysis treatment.   LOS: 8 Chrisy Hillebrand 5/23/20201:54 PM  [place], [place]

## 2019-03-10 NOTE — Progress Notes (Signed)
Post HD Tx  1 liter of fluid removed, tolerated well. Trialysis cath dressing changed.    03/10/19 1901  Hand-Off documentation  Report given to (Full Name) Bethel Born RN 2A  Report received from (Full Name) Trellis Paganini RN  Vital Signs  Temp 98.6 F (37 C)  Temp Source Oral  Pulse Rate (!) 53  Pulse Rate Source Monitor  Resp (!) 21  BP (!) 122/41  BP Location Left Arm  BP Method Automatic  Patient Position (if appropriate) Lying  Oxygen Therapy  SpO2 100 %  O2 Device Nasal Cannula  O2 Flow Rate (L/min) 3 L/min  Pain Assessment  Pain Scale 0-10  Pain Score 5  Pain Type Chronic pain  Pain Location Back  Dialysis Weight  Weight 115.6 kg  Type of Weight Post-Dialysis  Post-Hemodialysis Assessment  Rinseback Volume (mL) 250 mL  Dialyzer Clearance Lightly streaked  Duration of HD Treatment -hour(s) 2.5 hour(s)  Hemodialysis Intake (mL) 500 mL  UF Total -Machine (mL) 1500 mL  Net UF (mL) 1000 mL  Tolerated HD Treatment Yes  Hemodialysis Catheter  Placement Date: 03/09/19    Site Condition No complications  Blue Lumen Status Flushed;Capped (Central line);Heparin locked  Red Lumen Status Flushed;Capped (Central line);Heparin locked  Catheter fill solution Heparin 1000 units/ml  Dressing Type Biopatch;Occlusive  Dressing Status Antimicrobial disc changed;Dressing changed  Interventions New dressing  Drainage Description None  Post treatment catheter status Capped and Clamped

## 2019-03-11 LAB — GLUCOSE, CAPILLARY
Glucose-Capillary: 220 mg/dL — ABNORMAL HIGH (ref 70–99)
Glucose-Capillary: 300 mg/dL — ABNORMAL HIGH (ref 70–99)
Glucose-Capillary: 237 mg/dL — ABNORMAL HIGH (ref 70–99)
Glucose-Capillary: 246 mg/dL — ABNORMAL HIGH (ref 70–99)

## 2019-03-11 MED ORDER — EPOETIN ALFA 10000 UNIT/ML IJ SOLN
10000.0000 [IU] | INTRAMUSCULAR | Status: DC
  Administered 2019-03-12 – 2019-03-19 (×3): 10000 [IU] via INTRAVENOUS

## 2019-03-11 NOTE — Plan of Care (Signed)
  Problem: Education: Goal: Knowledge of General Education information will improve Description Including pain rating scale, medication(s)/side effects and non-pharmacologic comfort measures Outcome: Progressing   Problem: Clinical Measurements: Goal: Diagnostic test results will improve Outcome: Progressing   Problem: Safety: Goal: Ability to remain free from injury will improve Outcome: Progressing   Problem: Skin Integrity: Goal: Risk for impaired skin integrity will decrease Outcome: Progressing   Problem: Activity: Goal: Capacity to carry out activities will improve Outcome: Progressing

## 2019-03-11 NOTE — Progress Notes (Signed)
TB test read at 1900 and it was negative. There is no redness or bumps.

## 2019-03-11 NOTE — Progress Notes (Signed)
[place] at [facility]                                                                                                                                                                                  Patient Demographics   Carolyn Valdez, is a 78 y.o. female, DOB - 03/15/1941, SAY:[id number]  Admit date - 03/02/2019   Admitting Physician Vaughan Basta, MD  Outpatient Primary MD for the patient is [facility]   LOS - 9  Subjective: Patient feels tired but much alert   Review of Systems:   CONSTITUTIONAL: No documented fever.  Positive fatigue, positive weakness. No weight gain, no weight loss.  EYES: No blurry or double vision.  ENT: No tinnitus. No postnasal drip. No redness of the oropharynx.  RESPIRATORY: No cough, no wheeze, no hemoptysis.  Positive dyspnea.  CARDIOVASCULAR: No chest pain. No orthopnea. No palpitations. No syncope.  GASTROINTESTINAL: Positive nausea, no vomiting or diarrhea. No abdominal pain. No melena or hematochezia.  GENITOURINARY: No dysuria or hematuria.  ENDOCRINE: No polyuria or nocturia. No heat or cold intolerance.  HEMATOLOGY: No anemia. No bruising. No bleeding.  INTEGUMENTARY: No rashes. No lesions.  MUSCULOSKELETAL: No arthritis. No swelling. No gout.  Positive back pain NEUROLOGIC: No numbness, tingling, or ataxia. No seizure-type activity.  PSYCHIATRIC: No anxiety. No insomnia. No ADD.    Vitals:   Vitals:   03/10/19 1941 03/10/19 2157 03/11/19 0435 03/11/19 0808  BP: (!) 128/47 (!) 123/42 (!) 133/42 (!) 138/46  Pulse: 61 64 64 60  Resp: 20  18 20   Temp: 98.8 F (37.1 C)  98.3 F (36.8 C) 98.2 F (36.8 C)  TempSrc: Oral  Oral Oral  SpO2: 99%  96% 98%  Weight:   115.8 kg   Height:        Wt Readings from Last 3 Encounters:  03/11/19 115.8 kg  10/23/18 112.9 kg  09/28/18 113.2 kg     Intake/Output Summary (Last 24 hours) at 03/11/2019 1307 Last data filed at 03/11/2019 0846 Gross  per 24 hour  Intake 237 ml  Output 2000 ml  Net -1763 ml    Physical Exam:   GENERAL: Pleasant-appearing in no apparent distress.  HEAD, EYES, EARS, NOSE AND THROAT: Atraumatic, normocephalic. Extraocular muscles are intact. Pupils equal and reactive to light. Sclerae anicteric. No conjunctival injection. No oro-pharyngeal erythema.  NECK: Supple. There is no jugular venous distention. No bruits, no lymphadenopathy, no thyromegaly.  HEART: Regular rate and rhythm,. No murmurs, no rubs, no clicks.  LUNGS: Bilateral crackles throughout both lung.  ABDOMEN: Soft, flat, nontender, nondistended. Has good bowel sounds. No hepatosplenomegaly appreciated.  EXTREMITIES: No evidence  of any cyanosis, clubbing, or peripheral edema.  +2 pedal and radial pulses bilaterally.  NEUROLOGIC: The patient is alert, awake, and oriented x3 with no focal motor or sensory deficits appreciated bilaterally.  SKIN: Moist and warm with no rashes appreciated.  Psych: Not anxious, depressed LN: No inguinal LN enlargement    Antibiotics   Anti-infectives (From admission, onward)   None      Medications   Scheduled Meds: . aspirin  81 mg Oral Daily  . atorvastatin  40 mg Oral q1800  . Chlorhexidine Gluconate Cloth  6 each Topical Q0600  . citalopram  10 mg Oral Daily  . docusate sodium  100 mg Oral BID  . [START ON 03/12/2019] epoetin (EPOGEN/PROCRIT) injection  10,000 Units Intravenous Q M,W,F-HD  . feeding supplement (ENSURE ENLIVE)  237 mL Oral BID BM  . fluticasone  2 spray Each Nare Daily  . heparin  5,000 Units Subcutaneous Q8H  . hydrALAZINE  25 mg Oral TID WC  . insulin aspart  0-5 Units Subcutaneous QHS  . insulin aspart  0-9 Units Subcutaneous TID WC  . insulin aspart  7 Units Subcutaneous TID WC  . insulin glargine  24 Units Subcutaneous Daily  . lamoTRIgine  25 mg Oral BID  . levETIRAcetam  250 mg Oral BID  . loratadine  10 mg Oral Daily  . Melatonin  5 mg Oral QHS  . metoprolol tartrate   25 mg Oral BID  . multivitamin with minerals  1 tablet Oral Daily  . nystatin   Topical BID  . OLANZapine  10 mg Oral QHS  . pantoprazole  40 mg Oral BID  . polyethylene glycol  17 g Oral Daily  . senna-docusate  1 tablet Oral QHS  . sodium chloride flush  3 mL Intravenous Q12H  . tuberculin  5 Units Intradermal Once  . vitamin B-12  1,000 mcg Oral Daily   Continuous Infusions: . sodium chloride 250 mL (03/11/19 0610)   PRN Meds:.sodium chloride, acetaminophen, albuterol, alum & mag hydroxide-simeth, clonazePAM, docusate sodium, ondansetron, oxyCODONE-acetaminophen, promethazine   Data Review:   Micro Results Recent Results (from the past 240 hour(s))  SARS Coronavirus 2 (CEPHEID - Performed in [place] hospital lab), Hosp Order     Status: None   Collection Time: 03/02/19  6:35 AM  Result Value Ref Range Status   SARS Coronavirus 2 NEGATIVE NEGATIVE Final    Comment: (NOTE) If result is NEGATIVE SARS-CoV-2 target nucleic acids are NOT DETECTED. The SARS-CoV-2 RNA is generally detectable in upper and lower  respiratory specimens during the acute phase of infection. The lowest  concentration of SARS-CoV-2 viral copies this assay can detect is 250  copies / mL. A negative result does not preclude SARS-CoV-2 infection  and should not be used as the sole basis for treatment or other  patient management decisions.  A negative result may occur with  improper specimen collection / handling, submission of specimen other  than nasopharyngeal swab, presence of viral mutation(s) within the  areas targeted by this assay, and inadequate number of viral copies  (<250 copies / mL). A negative result must be combined with clinical  observations, patient history, and epidemiological information. If result is POSITIVE SARS-CoV-2 target nucleic acids are DETECTED. The SARS-CoV-2 RNA is generally detectable in upper and lower  respiratory specimens dur ing the acute phase of infection.   Positive  results are indicative of active infection with SARS-CoV-2.  Clinical  correlation with patient history and other  diagnostic information is  necessary to determine patient infection status.  Positive results do  not rule out bacterial infection or co-infection with other viruses. If result is PRESUMPTIVE POSTIVE SARS-CoV-2 nucleic acids MAY BE PRESENT.   A presumptive positive result was obtained on the submitted specimen  and confirmed on repeat testing.  While 2019 novel coronavirus  (SARS-CoV-2) nucleic acids may be present in the submitted sample  additional confirmatory testing may be necessary for epidemiological  and / or clinical management purposes  to differentiate between  SARS-CoV-2 and other Sarbecovirus currently known to infect humans.  If clinically indicated additional testing with an alternate test  methodology [phone]) is advised. The SARS-CoV-2 RNA is generally  detectable in upper and lower respiratory sp ecimens during the acute  phase of infection. The expected result is Negative. Fact Sheet for Patients:  StrictlyIdeas.no Fact Sheet for Healthcare Providers: BankingDealers.co.za This test is not yet approved or cleared by the [place] FDA and has been authorized for detection and/or diagnosis of SARS-CoV-2 by FDA under an Emergency Use Authorization (EUA).  This EUA will remain in effect (meaning this test can be used) for the duration of the COVID-19 declaration under Section 564(b)(1) of the Act, 21 U.S.C. section 360bbb-3(b)(1), unless the authorization is terminated or revoked sooner. Performed at [facility], [address] [place], [place] [address]   MRSA PCR Screening     Status: None   Collection Time: 03/06/19  8:15 PM  Result Value Ref Range Status   MRSA by PCR NEGATIVE NEGATIVE Final    Comment:        The GeneXpert MRSA Assay (FDA approved for NASAL specimens only),  is one component of a comprehensive MRSA colonization surveillance program. It is not intended to diagnose MRSA infection nor to guide or monitor treatment for MRSA infections. Performed at [facility], [address] [place], [place] [address]   Novel Coronavirus, NAA (hospital order; send-out to ref lab)     Status: None   Collection Time: 03/08/19  6:34 PM  Result Value Ref Range Status   SARS-CoV-2, NAA NOT DETECTED NOT DETECTED Final    Comment: (NOTE) This test was developed and its performance characteristics determined by [employer]. This test has not been FDA cleared or approved. This test has been authorized by FDA under an Emergency Use Authorization (EUA). This test is only authorized for the duration of time the declaration that circumstances exist justifying the authorization of the emergency use of in vitro diagnostic tests for detection of SARS-CoV-2 virus and/or diagnosis of COVID-19 infection under section 564(b)(1) of the Act, 21 U.S.C. 224MGN-0(I)(3), unless the authorization is terminated or revoked sooner. When diagnostic testing is negative, the possibility of a false negative result should be considered in the context of a patient's recent exposures and the presence of clinical signs and symptoms consistent with COVID-19. An individual without symptoms of COVID-19 and who is not shedding SARS-CoV-2 virus would expect to have a negative (not detected) result in this assay. Performed  At: [facility] [place], [place] [id number] Rush Farmer MD XI:[id number]    [place]  Final    Comment: Performed at [facility], [address] [place], [place] [address]    Radiology Reports Dg Chest 2 View  Result Date: 03/03/2019 CLINICAL DATA:  78 year old female with a known history of diastolic congestive heart failure. EXAM: CHEST - 2 VIEW COMPARISON:  Mar 02, 2019 FINDINGS: Stable  cardiomegaly. The hila and mediastinum are normal. No pulmonary nodules, masses, focal infiltrates, or overt edema. IMPRESSION: Cardiomegaly.  No acute abnormalities. Electronically Signed   By: Dorise Bullion III M.D   On: 03/03/2019 08:42   Dg Chest 2 View  Result Date: 03/02/2019 CLINICAL DATA:  78 year old female with shortness of breath for 2 days. Former smoker. EXAM: CHEST - 2 VIEW COMPARISON:  Portable chest 08/03/2018 and earlier. FINDINGS: Seated AP and lateral views of the chest. Stable cardiomegaly and mediastinal contours. Visualized tracheal air column is within normal limits. Stable lung volumes. No pneumothorax, pleural effusion or confluent pulmonary opacity. Calcified aortic atherosclerosis. Acute on chronic pulmonary interstitial opacity. No pleural fluid identified in the fissures on the lateral. Osteopenia.  Paucity of bowel gas in the upper abdomen. IMPRESSION: 1. Chronic cardiomegaly with acute on chronic pulmonary interstitial opacity. No pleural fluid identified. Consider pulmonary interstitial edema and viral/atypical respiratory infection. 2.  Aortic Atherosclerosis (ICD10-I70.0). Electronically Signed   By: Genevie Ann M.D.   On: 03/02/2019 06:57   US Renal  Result Date: 03/02/2019 CLINICAL DATA:  Acute renal failure EXAM: RENAL / URINARY TRACT ULTRASOUND COMPLETE COMPARISON:  Renal ultrasound 07/05/2018 FINDINGS: Right Kidney: Renal measurements: 10.8 x 6.6 x 6.5 cm = volume: 243 mL . Echogenicity within normal limits. No mass or hydronephrosis visualized. Left Kidney: Renal measurements: 10.8 x 6.7 x 6.7 cm = volume: 254 mL. Echogenicity within normal limits. No mass or hydronephrosis visualized. Bladder: Appears normal for degree of bladder distention. IMPRESSION: Negative renal ultrasound. Electronically Signed   By: Franchot Gallo M.D.   On: 03/02/2019 16:50     CBC No results for input(s): WBC, HGB, HCT, PLT, MCV, MCH, MCHC, RDW, LYMPHSABS, MONOABS, EOSABS, BASOSABS,  BANDABS in the last 168 hours.  Invalid input(s): NEUTRABS, BANDSABD  Chemistries  Recent Labs  Lab 03/06/19 0458 03/07/19 0319 03/08/19 1105 03/09/19 0419 03/10/19 0611  NA 136 134* 138 136 139  K 5.7* 4.7 4.9 5.2* 4.8  CL 95* 91* 93* 92* 97*  CO2 32 33* 34* 36* 35*  GLUCOSE 395* 166* 249* 224* 234*  BUN 86* 85* 85* 91* 67*  CREATININE 3.51* 3.55* 3.80* 4.23* 2.99*  CALCIUM 9.0 9.1 9.5 9.0 9.1   ------------------------------------------------------------------------------------------------------------------ estimated creatinine clearance is 21 mL/min (A) (by C-G formula based on SCr of 2.99 mg/dL (H)). ------------------------------------------------------------------------------------------------------------------ No results for input(s): HGBA1C in the last 72 hours. ------------------------------------------------------------------------------------------------------------------ No results for input(s): CHOL, HDL, LDLCALC, TRIG, CHOLHDL, LDLDIRECT in the last 72 hours. ------------------------------------------------------------------------------------------------------------------ No results for input(s): TSH, T4TOTAL, T3FREE, THYROIDAB in the last 72 hours.  Invalid input(s): FREET3 ------------------------------------------------------------------------------------------------------------------ No results for input(s): VITAMINB12, FOLATE, FERRITIN, TIBC, IRON, RETICCTPCT in the last 72 hours.  Coagulation profile No results for input(s): INR, PROTIME in the last 168 hours.  No results for input(s): DDIMER in the last 72 hours.  Cardiac Enzymes No results for input(s): CKMB, TROPONINI, MYOGLOBIN in the last 168 hours.  Invalid input(s): CK ------------------------------------------------------------------------------------------------------------------ Invalid input(s): POCBNP    Assessment & Plan   *Acute on chronic diastolic congestive heart failure Continue  IV Lasix drip , doing better with dialysis   *Acute renal failure on CKD stage III With hemodialysis uremic symptoms improved   *Hyperkalemia Stable  *Elevated troponin Due to demand ischemia  *Diabetes mellitus  Still elevated will adjust her regimen  *Hyperlipidemia Continue statin.  *Hypertension Blood pressure stable       Code Status Orders  (From admission, onward)  Start     Ordered   03/02/19 1211  Full code  Continuous     03/02/19 1210        Code Status History    Date Active Date Inactive Code Status Order ID Comments User Context   08/04/2018 0556 08/07/2018 1951 DNR [id number]  Harrie Foreman, MD Inpatient   07/06/2018 1407 07/14/2018 1603 DNR [id number]  Vaughan Basta, MD Inpatient   07/04/2018 2200 07/06/2018 1407 Full Code [id number]  Demetrios Loll, MD Inpatient   04/22/2018 1702 04/26/2018 1851 DNR [id number]  Max Sane, MD Inpatient   02/21/2017 1629 02/24/2017 1747 DNR [id number]  Fritzi Mandes, MD Inpatient   01/31/2017 1617 02/04/2017 1350 DNR [id number]  Max Sane, MD Inpatient   01/23/2017 1320 01/31/2017 1617 Full Code [id number]  Henreitta Leber, MD Inpatient   10/05/2016 1216 10/26/2016 1740 DNR [id number]  Knox Royalty, NP Inpatient   09/24/2016 1731 10/05/2016 1216 Full Code [id number]  Gladstone Lighter, MD Inpatient   09/01/2016 0217 09/05/2016 1628 Full Code [id number]  Hugelmeyer, Jamul Chapel, DO Inpatient   08/09/2016 2027 08/12/2016 2015 Full Code [id number]  Henreitta Leber, MD Inpatient   09/30/2015 1423 10/07/2015 1737 Full Code [id number]  Loletha Grayer, MD ED   09/05/2015 1756 09/09/2015 1738 DNR [id number]  Loletha Grayer, MD ED    Advance Directive Documentation     Most Recent Value  Type of Advance Directive  Healthcare Power of Attorney, Living will  Pre-existing out of facility DNR order (yellow form or pink MOST form)  -  "MOST" Form in Place?  -           Consults cardiology  DVT Prophylaxis Heparin  Lab  Results  Component Value Date   PLT 223 03/03/2019     Time Spent in minutes   35 minutes greater than 50% of time spent in care coordination and counseling patient regarding the condition and plan of care.   Dustin Flock M.D on 03/11/2019 at 1:07 PM  Between 7am to 6pm - Pager - [phone]  After 6pm go to [email or web address] - Proofreader  [employer]   Office  [phone]

## 2019-03-11 NOTE — Progress Notes (Signed)
[facility], [place] 03/11/19  Subjective:  Patient appears to be much more awake and alert today after starting dialysis. She has completed 2 dialysis treatments.   Objective:  Vital signs in last 24 hours:  Temp:  [98.2 F (36.8 C)-98.8 F (37.1 C)] 98.2 F (36.8 C) (05/24 0808) Pulse Rate:  [53-64] 60 (05/24 0808) Resp:  [18-22] 20 (05/24 0808) BP: (113-138)/(39-47) 138/46 (05/24 0808) SpO2:  [96 %-100 %] 98 % (05/24 0808) Weight:  [115.6 kg-116.6 kg] 115.8 kg (05/24 0435)  Weight change: 3 kg Filed Weights   03/10/19 1615 03/10/19 1901 03/11/19 0435  Weight: 116.6 kg 115.6 kg 115.8 kg    Intake/Output:    Intake/Output Summary (Last 24 hours) at 03/11/2019 1210 Last data filed at 03/11/2019 0846 Gross per 24 hour  Intake 237 ml  Output 2700 ml  Net -2463 ml     Physical Exam: General:  Obese, no acute distress, laying in the bed  HEENT  [place]/AT hearing intact OM moist  Neck  supple  Pulm/lungs  CTAB normal effort  CVS/Heart  S1S2 no rubs  Abdomen:   Soft, obese, nontender  Extremities:  trace pitting edema  Neurologic:  Awake, alert  Skin:  No acute rashes    External urinary catheter       Basic Metabolic Panel:  Recent Labs  Lab 03/06/19 0458 03/07/19 0319 03/08/19 1105 03/09/19 0419 03/10/19 0611 03/10/19 1708  NA 136 134* 138 136 139  --   K 5.7* 4.7 4.9 5.2* 4.8  --   CL 95* 91* 93* 92* 97*  --   CO2 32 33* 34* 36* 35*  --   GLUCOSE 395* 166* 249* 224* 234*  --   BUN 86* 85* 85* 91* 67*  --   CREATININE 3.51* 3.55* 3.80* 4.23* 2.99*  --   CALCIUM 9.0 9.1 9.5 9.0 9.1  --   PHOS 5.1* 4.7*  --  4.5 3.8 4.1     CBC: No results for input(s): WBC, NEUTROABS, HGB, HCT, MCV, PLT in the last 168 hours.    Lab Results  Component Value Date   HEPBSAG Negative 03/09/2019   HEPBSAB Non Reactive 03/09/2019      Microbiology:  Recent Results (from the past 240 hour(s))  SARS Coronavirus 2 (CEPHEID - Performed  in [place] hospital lab), Hosp Order     Status: None   Collection Time: 03/02/19  6:35 AM  Result Value Ref Range Status   SARS Coronavirus 2 NEGATIVE NEGATIVE Final    Comment: (NOTE) If result is NEGATIVE SARS-CoV-2 target nucleic acids are NOT DETECTED. The SARS-CoV-2 RNA is generally detectable in upper and lower  respiratory specimens during the acute phase of infection. The lowest  concentration of SARS-CoV-2 viral copies this assay can detect is 250  copies / mL. A negative result does not preclude SARS-CoV-2 infection  and should not be used as the sole basis for treatment or other  patient management decisions.  A negative result may occur with  improper specimen collection / handling, submission of specimen other  than nasopharyngeal swab, presence of viral mutation(s) within the  areas targeted by this assay, and inadequate number of viral copies  (<250 copies / mL). A negative result must be combined with clinical  observations, patient history, and epidemiological information. If result is POSITIVE SARS-CoV-2 target nucleic acids are DETECTED. The SARS-CoV-2 RNA is generally detectable in upper and lower  respiratory specimens dur ing the acute phase  of infection.  Positive  results are indicative of active infection with SARS-CoV-2.  Clinical  correlation with patient history and other diagnostic information is  necessary to determine patient infection status.  Positive results do  not rule out bacterial infection or co-infection with other viruses. If result is PRESUMPTIVE POSTIVE SARS-CoV-2 nucleic acids MAY BE PRESENT.   A presumptive positive result was obtained on the submitted specimen  and confirmed on repeat testing.  While 2019 novel coronavirus  (SARS-CoV-2) nucleic acids may be present in the submitted sample  additional confirmatory testing may be necessary for epidemiological  and / or clinical management purposes  to differentiate between  SARS-CoV-2  and other Sarbecovirus currently known to infect humans.  If clinically indicated additional testing with an alternate test  methodology [phone]) is advised. The SARS-CoV-2 RNA is generally  detectable in upper and lower respiratory sp ecimens during the acute  phase of infection. The expected result is Negative. Fact Sheet for Patients:  StrictlyIdeas.no Fact Sheet for Healthcare Providers: BankingDealers.co.za This test is not yet approved or cleared by the [place] FDA and has been authorized for detection and/or diagnosis of SARS-CoV-2 by FDA under an Emergency Use Authorization (EUA).  This EUA will remain in effect (meaning this test can be used) for the duration of the COVID-19 declaration under Section 564(b)(1) of the Act, 21 U.S.C. section 360bbb-3(b)(1), unless the authorization is terminated or revoked sooner. Performed at [facility], [address] [place], [place] [address]   MRSA PCR Screening     Status: None   Collection Time: 03/06/19  8:15 PM  Result Value Ref Range Status   MRSA by PCR NEGATIVE NEGATIVE Final    Comment:        The GeneXpert MRSA Assay (FDA approved for NASAL specimens only), is one component of a comprehensive MRSA colonization surveillance program. It is not intended to diagnose MRSA infection nor to guide or monitor treatment for MRSA infections. Performed at [facility], [address] [place], [place] [address]   Novel Coronavirus, NAA (hospital order; send-out to ref lab)     Status: None   Collection Time: 03/08/19  6:34 PM  Result Value Ref Range Status   SARS-CoV-2, NAA NOT DETECTED NOT DETECTED Final    Comment: (NOTE) This test was developed and its performance characteristics determined by [employer]. This test has not been FDA cleared or approved. This test has been authorized by FDA under an Emergency Use Authorization (EUA). This test  is only authorized for the duration of time the declaration that circumstances exist justifying the authorization of the emergency use of in vitro diagnostic tests for detection of SARS-CoV-2 virus and/or diagnosis of COVID-19 infection under section 564(b)(1) of the Act, 21 U.S.C. 166AYT-0(Z)(6), unless the authorization is terminated or revoked sooner. When diagnostic testing is negative, the possibility of a false negative result should be considered in the context of a patient's recent exposures and the presence of clinical signs and symptoms consistent with COVID-19. An individual without symptoms of COVID-19 and who is not shedding SARS-CoV-2 virus would expect to have a negative (not detected) result in this assay. Performed  At: [facility] [place], [place] [id number] Rush Farmer MD DD:[id number]    [place]  Final    Comment: Performed at [facility], [address] [place],  [address]    Coagulation Studies: No results for input(s): LABPROT, INR in the last 72 hours.  Urinalysis: No results for input(s): COLORURINE, LABSPEC, PHURINE, GLUCOSEU, HGBUR, BILIRUBINUR, KETONESUR, PROTEINUR, UROBILINOGEN, NITRITE, LEUKOCYTESUR in the last 72 hours.  Invalid input(s): APPERANCEUR    Imaging: No results found.   Medications:   . sodium chloride 250 mL (03/11/19 0610)  . furosemide (LASIX) infusion 4 mg/hr (03/10/19 1723)   . aspirin  81 mg Oral Daily  . atorvastatin  40 mg Oral q1800  . Chlorhexidine Gluconate Cloth  6 each Topical Q0600  . citalopram  10 mg Oral Daily  . docusate sodium  100 mg Oral BID  . feeding supplement (ENSURE ENLIVE)  237 mL Oral BID BM  . fluticasone  2 spray Each Nare Daily  . heparin  5,000 Units Subcutaneous Q8H  . hydrALAZINE  25 mg Oral TID WC  . insulin aspart  0-5 Units Subcutaneous QHS  . insulin aspart  0-9 Units Subcutaneous TID WC  . insulin aspart  7 Units  Subcutaneous TID WC  . insulin glargine  24 Units Subcutaneous Daily  . lamoTRIgine  25 mg Oral BID  . levETIRAcetam  250 mg Oral BID  . loratadine  10 mg Oral Daily  . Melatonin  5 mg Oral QHS  . metoprolol tartrate  25 mg Oral BID  . multivitamin with minerals  1 tablet Oral Daily  . nystatin   Topical BID  . OLANZapine  10 mg Oral QHS  . pantoprazole  40 mg Oral BID  . polyethylene glycol  17 g Oral Daily  . senna-docusate  1 tablet Oral QHS  . sodium chloride flush  3 mL Intravenous Q12H  . tuberculin  5 Units Intradermal Once  . vitamin B-12  1,000 mcg Oral Daily   sodium chloride, acetaminophen, albuterol, alum & mag hydroxide-simeth, clonazePAM, docusate sodium, ondansetron, oxyCODONE-acetaminophen, promethazine  Assessment/ Plan:  78 y.o. Caucasian female with with congestive heart failure , hypertension, diabetes mellitus type II, obstructive sleep apnea, nephrolithiasis   Principal Problem:   Acute on chronic diastolic CHF (congestive heart failure) (HCC) Active Problems:   Acute on chronic heart failure with preserved ejection fraction (HFpEF) (HCC)   Acute renal failure superimposed on chronic kidney disease ([place])   #.  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   Last known outpatient creatinine from December 12 is 2.69/GFR 17 Underlying CKD likely secondary to atherosclerosis, diabetes and hypertension -Patient still on Lasix drip and producing 1.7 L.  However she was becoming uremic with the Lasix drip.  We will go ahead and stop Lasix drip now that she is on dialysis.  Recent Labs    03/07/19 0319 03/08/19 1105 03/09/19 0419 03/10/19 0611  CREATININE 3.55* 3.80* 4.23* 2.99*    #. Anemia of CKD  Lab Results  Component Value Date   HGB 7.9 (L) 03/03/2019  Start the patient on Epogen 10,000 units IV with dialysis on MWF.  #.  Hyperphosphatemia     Component Value Date/Time   PTH 51 03/04/2019 0507   Lab Results  Component Value Date   PHOS  4.1 03/10/2019  Phosphorus 4.1 at last check and within target range.  #. Diabetes type 2 with CKD Hemoglobin A1C (%)  Date Value  09/04/2014 6.2   Hgb A1c MFr Bld (%)  Date Value  04/22/2018 7.6 (H)    #Grade 2 diastolic dysfunction with pulmonary hypertension, tricuspid regurgitation, generalized edema Stop Lasix drip.  Hyperkalemia. Continue to monitor serum potassium over the course of the hospitalization.   LOS: 9  Sanya Kobrin 5/24/202012:10 PM  [place], [place]

## 2019-03-12 LAB — RENAL FUNCTION PANEL
Albumin: 3.1 g/dL — ABNORMAL LOW (ref 3.5–5.0)
Anion gap: 7 (ref 5–15)
BUN: 59 mg/dL — ABNORMAL HIGH (ref 8–23)
CO2: 33 mmol/L — ABNORMAL HIGH (ref 22–32)
Calcium: 9.5 mg/dL (ref 8.9–10.3)
Chloride: 95 mmol/L — ABNORMAL LOW (ref 98–111)
Creatinine, Ser: 3 mg/dL — ABNORMAL HIGH (ref 0.44–1.00)
GFR calc Af Amer: 17 mL/min — ABNORMAL LOW (ref >60)
GFR calc non Af Amer: 14 mL/min — ABNORMAL LOW (ref >60)
Glucose, Bld: 201 mg/dL — ABNORMAL HIGH (ref 70–99)
Phosphorus: 3.8 mg/dL (ref 2.5–4.6)
Potassium: 4.8 mmol/L (ref 3.5–5.1)
Sodium: 135 mmol/L (ref 135–145)

## 2019-03-12 LAB — GLUCOSE, CAPILLARY
Glucose-Capillary: 197 mg/dL — ABNORMAL HIGH (ref 70–99)
Glucose-Capillary: 221 mg/dL — ABNORMAL HIGH (ref 70–99)
Glucose-Capillary: 172 mg/dL — ABNORMAL HIGH (ref 70–99)
Glucose-Capillary: 203 mg/dL — ABNORMAL HIGH (ref 70–99)
Glucose-Capillary: 286 mg/dL — ABNORMAL HIGH (ref 70–99)

## 2019-03-12 LAB — CBC
HCT: 25.4 % — ABNORMAL LOW (ref 36.0–46.0)
Hemoglobin: 7.8 g/dL — ABNORMAL LOW (ref 12.0–15.0)
MCH: 28.9 pg (ref 26.0–34.0)
MCHC: 30.7 g/dL (ref 30.0–36.0)
MCV: 94.1 fL (ref 80.0–100.0)
Platelets: 221 10*3/uL (ref 150–400)
RBC: 2.7 MIL/uL — ABNORMAL LOW (ref 3.87–5.11)
RDW: 15.1 % (ref 11.5–15.5)
WBC: 7.6 10*3/uL (ref 4.0–10.5)
nRBC: 0 % (ref 0.0–0.2)

## 2019-03-12 NOTE — Progress Notes (Signed)
[facility]                                                                                                                                                                                  Patient Demographics   Carolyn Valdez, is a 78 y.o. female, DOB - Nov 30, 1940, WUX:[id number]  Admit date - 03/02/2019   Admitting Physician Vaughan Basta, MD  Outpatient Primary MD for the patient is [facility]   LOS - 10  Subjective: Patient tolerating hemodialysis, has some weakness but otherwise doing well   Review of Systems:   CONSTITUTIONAL: No documented fever.  Positive fatigue, positive weakness. No weight gain, no weight loss.  EYES: No blurry or double vision.  ENT: No tinnitus. No postnasal drip. No redness of the oropharynx.  RESPIRATORY: No cough, no wheeze, no hemoptysis.  Positive dyspnea.  CARDIOVASCULAR: No chest pain. No orthopnea. No palpitations. No syncope.  GASTROINTESTINAL: Positive nausea, no vomiting or diarrhea. No abdominal pain. No melena or hematochezia.  GENITOURINARY: No dysuria or hematuria.  ENDOCRINE: No polyuria or nocturia. No heat or cold intolerance.  HEMATOLOGY: No anemia. No bruising. No bleeding.  INTEGUMENTARY: No rashes. No lesions.  MUSCULOSKELETAL: No arthritis. No swelling. No gout.  Positive back pain NEUROLOGIC: No numbness, tingling, or ataxia. No seizure-type activity.  PSYCHIATRIC: No anxiety. No insomnia. No ADD.    Vitals:   Vitals:   03/12/19 1115 03/12/19 1130 03/12/19 1145 03/12/19 1200  BP: (!) 127/39 (!) 137/41 (!) 146/48 (!) 135/38  Pulse: (!) 52 (!) 54 60 (!) 55  Resp: (!) 25 (!) 25 (!) 23 (!) 24  Temp:      TempSrc:      SpO2:      Weight:      Height:        Wt Readings from Last 3 Encounters:  03/12/19 115.5 kg  10/23/18 112.9 kg  09/28/18 113.2 kg     Intake/Output Summary (Last 24 hours) at 03/12/2019 1234 Last data filed at 03/11/2019 1849 Gross per 24  hour  Intake 826.28 ml  Output 900 ml  Net -73.72 ml    Physical Exam:   GENERAL: Pleasant-appearing in no apparent distress.  HEAD, EYES, EARS, NOSE AND THROAT: Atraumatic, normocephalic. Extraocular muscles are intact. Pupils equal and reactive to light. Sclerae anicteric. No conjunctival injection. No oro-pharyngeal erythema.  NECK: Supple. There is no jugular venous distention. No bruits, no lymphadenopathy, no thyromegaly.  HEART: Regular rate and rhythm,. No murmurs, no rubs, no clicks.  LUNGS: Bilateral crackles throughout both lung.  ABDOMEN: Soft, flat, nontender, nondistended. Has good bowel sounds. No hepatosplenomegaly appreciated.  EXTREMITIES: No  evidence of any cyanosis, clubbing, or peripheral edema.  +2 pedal and radial pulses bilaterally.  NEUROLOGIC: The patient is alert, awake, and oriented x3 with no focal motor or sensory deficits appreciated bilaterally.  SKIN: Moist and warm with no rashes appreciated.  Psych: Not anxious, depressed LN: No inguinal LN enlargement    Antibiotics   Anti-infectives (From admission, onward)   None      Medications   Scheduled Meds: . aspirin  81 mg Oral Daily  . atorvastatin  40 mg Oral q1800  . Chlorhexidine Gluconate Cloth  6 each Topical Q0600  . citalopram  10 mg Oral Daily  . docusate sodium  100 mg Oral BID  . epoetin (EPOGEN/PROCRIT) injection  10,000 Units Intravenous Q M,W,F-HD  . feeding supplement (ENSURE ENLIVE)  237 mL Oral BID BM  . fluticasone  2 spray Each Nare Daily  . heparin  5,000 Units Subcutaneous Q8H  . hydrALAZINE  25 mg Oral TID WC  . insulin aspart  0-5 Units Subcutaneous QHS  . insulin aspart  0-9 Units Subcutaneous TID WC  . insulin aspart  7 Units Subcutaneous TID WC  . insulin glargine  24 Units Subcutaneous Daily  . lamoTRIgine  25 mg Oral BID  . levETIRAcetam  250 mg Oral BID  . loratadine  10 mg Oral Daily  . Melatonin  5 mg Oral QHS  . metoprolol tartrate  25 mg Oral BID  .  multivitamin with minerals  1 tablet Oral Daily  . nystatin   Topical BID  . OLANZapine  10 mg Oral QHS  . pantoprazole  40 mg Oral BID  . polyethylene glycol  17 g Oral Daily  . senna-docusate  1 tablet Oral QHS  . sodium chloride flush  3 mL Intravenous Q12H  . vitamin B-12  1,000 mcg Oral Daily   Continuous Infusions: . sodium chloride Stopped (03/11/19 1725)   PRN Meds:.sodium chloride, acetaminophen, albuterol, alum & mag hydroxide-simeth, clonazePAM, docusate sodium, ondansetron, oxyCODONE-acetaminophen, promethazine   Data Review:   Micro Results Recent Results (from the past 240 hour(s))  MRSA PCR Screening     Status: None   Collection Time: 03/06/19  8:15 PM  Result Value Ref Range Status   MRSA by PCR NEGATIVE NEGATIVE Final    Comment:        The GeneXpert MRSA Assay (FDA approved for NASAL specimens only), is one component of a comprehensive MRSA colonization surveillance program. It is not intended to diagnose MRSA infection nor to guide or monitor treatment for MRSA infections. Performed at [facility], [address] [place], [place] [address]   Novel Coronavirus, NAA (hospital order; send-out to ref lab)     Status: None   Collection Time: 03/08/19  6:34 PM  Result Value Ref Range Status   SARS-CoV-2, NAA NOT DETECTED NOT DETECTED Final    Comment: (NOTE) This test was developed and its performance characteristics determined by [employer]. This test has not been FDA cleared or approved. This test has been authorized by FDA under an Emergency Use Authorization (EUA). This test is only authorized for the duration of time the declaration that circumstances exist justifying the authorization of the emergency use of in vitro diagnostic tests for detection of SARS-CoV-2 virus and/or diagnosis of COVID-19 infection under section 564(b)(1) of the Act, 21 U.S.C. 016WFU-9(N)(2), unless the authorization is terminated or revoked sooner.  When diagnostic testing is negative, the possibility of a false negative result should be considered in  the context of a patient's recent exposures and the presence of clinical signs and symptoms consistent with COVID-19. An individual without symptoms of COVID-19 and who is not shedding SARS-CoV-2 virus would expect to have a negative (not detected) result in this assay. Performed  At: [facility] [place], [place] [id number] Rush Farmer MD BE:[id number]    [place]  Final    Comment: Performed at [facility], [address] [place], [place] [address]    Radiology Reports Dg Chest 2 View  Result Date: 03/03/2019 CLINICAL DATA:  78 year old female with a known history of diastolic congestive heart failure. EXAM: CHEST - 2 VIEW COMPARISON:  Mar 02, 2019 FINDINGS: Stable cardiomegaly. The hila and mediastinum are normal. No pulmonary nodules, masses, focal infiltrates, or overt edema. IMPRESSION: Cardiomegaly.  No acute abnormalities. Electronically Signed   By: Dorise Bullion III M.D   On: 03/03/2019 08:42   Dg Chest 2 View  Result Date: 03/02/2019 CLINICAL DATA:  78 year old female with shortness of breath for 2 days. Former smoker. EXAM: CHEST - 2 VIEW COMPARISON:  Portable chest 08/03/2018 and earlier. FINDINGS: Seated AP and lateral views of the chest. Stable cardiomegaly and mediastinal contours. Visualized tracheal air column is within normal limits. Stable lung volumes. No pneumothorax, pleural effusion or confluent pulmonary opacity. Calcified aortic atherosclerosis. Acute on chronic pulmonary interstitial opacity. No pleural fluid identified in the fissures on the lateral. Osteopenia.  Paucity of bowel gas in the upper abdomen. IMPRESSION: 1. Chronic cardiomegaly with acute on chronic pulmonary interstitial opacity. No pleural fluid identified. Consider pulmonary interstitial edema and viral/atypical respiratory infection.  2.  Aortic Atherosclerosis (ICD10-I70.0). Electronically Signed   By: Genevie Ann M.D.   On: 03/02/2019 06:57   US Renal  Result Date: 03/02/2019 CLINICAL DATA:  Acute renal failure EXAM: RENAL / URINARY TRACT ULTRASOUND COMPLETE COMPARISON:  Renal ultrasound 07/05/2018 FINDINGS: Right Kidney: Renal measurements: 10.8 x 6.6 x 6.5 cm = volume: 243 mL . Echogenicity within normal limits. No mass or hydronephrosis visualized. Left Kidney: Renal measurements: 10.8 x 6.7 x 6.7 cm = volume: 254 mL. Echogenicity within normal limits. No mass or hydronephrosis visualized. Bladder: Appears normal for degree of bladder distention. IMPRESSION: Negative renal ultrasound. Electronically Signed   By: Franchot Gallo M.D.   On: 03/02/2019 16:50     CBC Recent Labs  Lab 03/12/19 0727  WBC 7.6  HGB 7.8*  HCT 25.4*  PLT 221  MCV 94.1  MCH 28.9  MCHC 30.7  RDW 15.1    Chemistries  Recent Labs  Lab 03/07/19 0319 03/08/19 1105 03/09/19 0419 03/10/19 0611 03/12/19 1109  NA 134* 138 136 139 135  K 4.7 4.9 5.2* 4.8 4.8  CL 91* 93* 92* 97* 95*  CO2 33* 34* 36* 35* 33*  GLUCOSE 166* 249* 224* 234* 201*  BUN 85* 85* 91* 67* 59*  CREATININE 3.55* 3.80* 4.23* 2.99* 3.00*  CALCIUM 9.1 9.5 9.0 9.1 9.5   ------------------------------------------------------------------------------------------------------------------ estimated creatinine clearance is 21 mL/min (A) (by C-G formula based on SCr of 3 mg/dL (H)). ------------------------------------------------------------------------------------------------------------------ No results for input(s): HGBA1C in the last 72 hours. ------------------------------------------------------------------------------------------------------------------ No results for input(s): CHOL, HDL, LDLCALC, TRIG, CHOLHDL, LDLDIRECT in the last 72 hours. ------------------------------------------------------------------------------------------------------------------ No results for  input(s): TSH, T4TOTAL, T3FREE, THYROIDAB in the last 72 hours.  Invalid input(s): FREET3 ------------------------------------------------------------------------------------------------------------------ No results for input(s): VITAMINB12, FOLATE, FERRITIN, TIBC, IRON, RETICCTPCT in the last 72 hours.  Coagulation profile No results for input(s): INR,  PROTIME in the last 168 hours.  No results for input(s): DDIMER in the last 72 hours.  Cardiac Enzymes No results for input(s): CKMB, TROPONINI, MYOGLOBIN in the last 168 hours.  Invalid input(s): CK ------------------------------------------------------------------------------------------------------------------ Invalid input(s): POCBNP    Assessment & Plan   *Acute on chronic diastolic congestive heart failure Patient initiated on hemodialysis   *Acute renal failure on CKD stage III With hemodialysis uremic symptoms improved Patient will need outpatient hemodialysis  *Hyperkalemia Stable  *Elevated troponin Due to demand ischemia  *Diabetes mellitus  Blood sugars with some improvement, I will add scheduled pre-meal insulin   *Hyperlipidemia Continue statin.  *Hypertension Blood pressure stable  *Anemia of chronic disease continue to follow CBC  Disposition discharge to assisted living facility tomorrow, according to nephrology but will be confirmed by tomorrow      Code Status Orders  (From admission, onward)         Start     Ordered   03/02/19 1211  Full code  Continuous     03/02/19 1210        Code Status History    Date Active Date Inactive Code Status Order ID Comments User Context   08/04/2018 0556 08/07/2018 1951 DNR [id number]  Harrie Foreman, MD Inpatient   07/06/2018 1407 07/14/2018 1603 DNR [id number]  Vaughan Basta, MD Inpatient   07/04/2018 2200 07/06/2018 1407 Full Code [id number]  Demetrios Loll, MD Inpatient   04/22/2018 1702 04/26/2018 1851 DNR [id number]  Max Sane, MD  Inpatient   02/21/2017 1629 02/24/2017 1747 DNR [id number]  Fritzi Mandes, MD Inpatient   01/31/2017 1617 02/04/2017 1350 DNR [id number]  Max Sane, MD Inpatient   01/23/2017 1320 01/31/2017 1617 Full Code [id number]  Henreitta Leber, MD Inpatient   10/05/2016 1216 10/26/2016 1740 DNR [id number]  Knox Royalty, NP Inpatient   09/24/2016 1731 10/05/2016 1216 Full Code [id number]  Gladstone Lighter, MD Inpatient   09/01/2016 0217 09/05/2016 1628 Full Code [id number]  Hugel[place]yer, [employer], DO Inpatient   08/09/2016 2027 08/12/2016 2015 Full Code [id number]  Henreitta Leber, MD Inpatient   09/30/2015 1423 10/07/2015 1737 Full Code [id number]  Loletha Grayer, MD ED   09/05/2015 1756 09/09/2015 1738 DNR [id number]  Loletha Grayer, MD ED    Advance Directive Documentation     Most Recent Value  Type of Advance Directive  Healthcare Power of Attorney, Living will  Pre-existing out of facility DNR order (yellow form or pink MOST form)  -  "MOST" Form in Place?  -           Consults cardiology  DVT Prophylaxis Heparin  Lab Results  Component Value Date   PLT 221 03/12/2019     Time Spent in minutes   25 minutes.   Dustin Flock M.D on 03/12/2019 at 12:34 PM  Between 7am to 6pm - Pager - [phone]  After 6pm go to [email or web address] - Proofreader  [employer]   Office  [phone]

## 2019-03-12 NOTE — Progress Notes (Signed)
Post HD Assessment    03/12/19 1250  Neurological  Level of Consciousness Alert  Orientation Level Oriented X4  Respiratory  Respiratory Pattern Regular;Unlabored  Chest Assessment Chest expansion symmetrical  Bilateral Breath Sounds Clear;Diminished  Cough None  Cardiac  Pulse Regular  Heart Sounds S1, S2  ECG Monitor Yes  Cardiac Rhythm NSR  Vascular  R Radial Pulse +2  L Radial Pulse +2  Edema Generalized  Generalized Edema +2  Psychosocial  Psychosocial (WDL) WDL  Patient Behaviors Calm;Cooperative

## 2019-03-12 NOTE — TOC Progression Note (Signed)
Transition of Care [facility]) - Progression Note    Patient Details  Name: Carolyn Valdez MRN: [id number] Date of Birth: [birth year]  Transition of Care [facility]) CM/SW Contact  Ross Ludwig, [place] Phone Number: 03/12/2019, 5:28 PM  Clinical Narrative:     CSW contacted [facility], they said patient can return, depending on how she does with therapy again.  [name] was informed that CSW is waiting for confirmation of when patient will have a chair time set up for dialysis.   Expected Discharge Plan: Assisted Living Barriers to Discharge: Continued Medical Work up  Expected Discharge Plan and Services Expected Discharge Plan: Assisted Living   Discharge Planning Services: CM Consult Post Acute Care Choice: Durable Medical Equipment Living arrangements for the past 2 months: Assisted Living Facility                           HH Arranged: RN, PT, OT [facility] Agency: [facility]now [facility] Date [place]: 03/05/19 Time [place]: [place] Representative spoke with at [place]: [place] ([place]) Interventions    Readmission Risk Interventions No flowsheet data found.

## 2019-03-12 NOTE — Progress Notes (Signed)
HD Tx completed, tolerated well, pt diastolic B/P low, no deviation from baseline, MD is aware.    03/12/19 1245  Vital Signs  Pulse Rate 61  Pulse Rate Source Monitor  Resp (!) 25  BP (!) 137/32  BP Location Left Arm  BP Method Automatic  Patient Position (if appropriate) Lying  Oxygen Therapy  SpO2 100 %  O2 Device Nasal Cannula  O2 Flow Rate (L/min) 3 L/min  Pulse Oximetry Type Continuous  During Hemodialysis Assessment  HD Safety Checks Performed Yes  KECN 57.8 KECN  Dialysis Fluid Bolus Normal Saline  Bolus Amount (mL) 250 mL  Intra-Hemodialysis Comments Tx completed;Tolerated well

## 2019-03-12 NOTE — Progress Notes (Signed)
Pre HD [facility]  03/12/19 0940  Hand-Off documentation  Report given to (Full Name) Beatris Ship, RN   Report received from (Full Name) Georg Ruddle, RN   Vital Signs  Temp 98.9 F (37.2 C)  Temp Source Oral  Pulse Rate (!) 52  Pulse Rate Source Monitor  Resp (!) 22  BP 138/68  BP Location Left Arm  BP Method Automatic  Patient Position (if appropriate) Lying  Oxygen Therapy  SpO2 100 %  O2 Device Nasal Cannula  O2 Flow Rate (L/min) 3 L/min  Pulse Oximetry Type Continuous  Pain Assessment  Pain Scale 0-10  Pain Score 0  Dialysis Weight  Weight 115.5 kg  Type of Weight Pre-Dialysis  Time-Out for Hemodialysis  What Procedure? HD  Pt Identifiers(min of two) First/Last Name  Correct Site? Yes  Correct Side? Yes  Correct Procedure? Yes  Consents Verified? Yes  Rad Studies Available? N/A  Safety Precautions Reviewed? Yes  Engineer, civil (consulting) Number 1  Station Number 3  UF/Alarm Test Passed  Conductivity: Meter 13.8  Conductivity: Machine  13.7  pH 7.2  Reverse Osmosis Main  Normal Saline Lot Number [id number]  Dialyzer Lot Number [id number]  Disposable Set Lot Number [id number]  Machine Temperature 98.6 F (37 C)  Musician and Audible Yes  Blood Lines Intact and Secured Yes  Pre Treatment Patient Checks  Vascular access used during treatment Catheter  Patient is receiving dialysis in a chair Yes  Hepatitis B Surface Antigen Results Negative  Date Hepatitis B Surface Antigen Drawn 03/09/19  Hepatitis B Surface Antibody  (<10)  Date Hepatitis B Surface Antibody Drawn 03/09/19  Hemodialysis Consent Verified Yes  Hemodialysis Standing Orders Initiated Yes  ECG (Telemetry) Monitor On Yes  Prime Ordered Normal Saline  Length of  DialysisTreatment -hour(s) 3 Hour(s)  Dialysis Treatment Comments Na 140  Dialyzer Elisio 17H NR  Dialysate 3K, 2.5 Ca  Dialysis Anticoagulant None  Dialysate Flow Ordered 600  Blood Flow Rate Ordered 300 mL/min  Ultrafiltration Goal 1  Liters  Pre Treatment Labs Renal panel  Dialysis Blood Pressure Support Ordered Normal Saline  Education / Care Plan  Dialysis Education Provided Yes  Documented Education in Care Plan Yes  Hemodialysis Catheter  Placement Date: 03/09/19    Site Condition No complications  Blue Lumen Status Blood return noted  Red Lumen Status Blood return noted  Purple Lumen Status Capped (Central line)  Dressing Status Clean;Dry;Intact

## 2019-03-12 NOTE — Progress Notes (Signed)
Post HD Tx    03/12/19 1247  Hand-Off documentation  Report given to (Full Name) Georg Ruddle, RN   Report received from (Full Name) Beatris Ship, RN   Vital Signs  Temp 98.8 F (37.1 C)  Temp Source Oral  Pulse Rate 62  Pulse Rate Source Monitor  Resp (!) 24  BP (!) 134/35  BP Location Left Arm  BP Method Automatic  Patient Position (if appropriate) Lying  Oxygen Therapy  SpO2 100 %  O2 Device Nasal Cannula  O2 Flow Rate (L/min) 3 L/min  Pulse Oximetry Type Continuous  Pain Assessment  Pain Scale 0-10  Pain Score 0  Dialysis Weight  Weight 114.5 kg  Type of Weight Post-Dialysis  Post-Hemodialysis Assessment  Rinseback Volume (mL) 250 mL  KECN 57.8 V  Dialyzer Clearance Lightly streaked  Duration of HD Treatment -hour(s) 3 hour(s)  Hemodialysis Intake (mL) 500 mL  UF Total -Machine (mL) 1507 mL  Net UF (mL) 1007 mL  Tolerated HD Treatment Yes  Hemodialysis Catheter  Placement Date: 03/09/19    Site Condition No complications  Blue Lumen Status Heparin locked  Red Lumen Status Heparin locked  Post treatment catheter status Capped and Clamped

## 2019-03-12 NOTE — Progress Notes (Signed)
Pre HD Assessment    03/12/19 0938  Neurological  Level of Consciousness Alert  Orientation Level Oriented X4  Respiratory  Respiratory Pattern Regular;Unlabored  Chest Assessment Chest expansion symmetrical  Bilateral Breath Sounds Diminished  Cough None  Cardiac  Pulse Regular  Heart Sounds S1, S2  ECG Monitor Yes  Cardiac Rhythm SB  Vascular  R Radial Pulse +2  L Radial Pulse +2  Edema Generalized  Generalized Edema +2  Psychosocial  Psychosocial (WDL) WDL  Patient Behaviors Calm;Cooperative

## 2019-03-12 NOTE — Plan of Care (Signed)

## 2019-03-12 NOTE — Progress Notes (Signed)
[place], [place] 03/12/19  Subjective:  Patient seen and evaluated during her dialysis treatment. Tolerating well.    Objective:  Vital signs in last 24 hours:  Temp:  [98.1 F (36.7 C)-98.9 F (37.2 C)] 98.9 F (37.2 C) (05/25 0940) Pulse Rate:  [51-66] 55 (05/25 1200) Resp:  [16-26] 24 (05/25 1200) BP: (120-149)/(37-73) 135/38 (05/25 1200) SpO2:  [98 %-100 %] 100 % (05/25 0945) Weight:  [115.5 kg] 115.5 kg (05/25 0940)  Weight change: -1.069 kg Filed Weights   03/11/19 0435 03/12/19 0418 03/12/19 0940  Weight: 115.8 kg 115.5 kg 115.5 kg    Intake/Output:    Intake/Output Summary (Last 24 hours) at 03/12/2019 1217 Last data filed at 03/11/2019 1849 Gross per 24 hour  Intake 826.28 ml  Output 900 ml  Net -73.72 ml     Physical Exam: General:  Obese, no acute distress, laying in the bed  HEENT  [place]/AT hearing intact OM moist  Neck  supple  Pulm/lungs  CTAB normal effort  CVS/Heart  S1S2 no rubs  Abdomen:   Soft, obese, nontender  Extremities:  trace pitting edema  Neurologic:  Awake, alert  Skin:  No acute rashes    External urinary catheter       Basic Metabolic Panel:  Recent Labs  Lab 03/07/19 0319 03/08/19 1105 03/09/19 0419 03/10/19 0611 03/10/19 1708 03/12/19 1109  NA 134* 138 136 139  --  135  K 4.7 4.9 5.2* 4.8  --  4.8  CL 91* 93* 92* 97*  --  95*  CO2 33* 34* 36* 35*  --  33*  GLUCOSE 166* 249* 224* 234*  --  201*  BUN 85* 85* 91* 67*  --  59*  CREATININE 3.55* 3.80* 4.23* 2.99*  --  3.00*  CALCIUM 9.1 9.5 9.0 9.1  --  9.5  PHOS 4.7*  --  4.5 3.8 4.1 3.8     CBC: Recent Labs  Lab 03/12/19 0727  WBC 7.6  HGB 7.8*  HCT 25.4*  MCV 94.1  PLT 221      Lab Results  Component Value Date   HEPBSAG Negative 03/09/2019   HEPBSAB Non Reactive 03/09/2019      Microbiology:  Recent Results (from the past 240 hour(s))  MRSA PCR Screening     Status: None   Collection Time: 03/06/19  8:15 PM   Result Value Ref Range Status   MRSA by PCR NEGATIVE NEGATIVE Final    Comment:        The GeneXpert MRSA Assay (FDA approved for NASAL specimens only), is one component of a comprehensive MRSA colonization surveillance program. It is not intended to diagnose MRSA infection nor to guide or monitor treatment for MRSA infections. Performed at [facility], [address] [place], [place] [address]   Novel Coronavirus, NAA (hospital order; send-out to ref lab)     Status: None   Collection Time: 03/08/19  6:34 PM  Result Value Ref Range Status   SARS-CoV-2, NAA NOT DETECTED NOT DETECTED Final    Comment: (NOTE) This test was developed and its performance characteristics determined by [employer]. This test has not been FDA cleared or approved. This test has been authorized by FDA under an Emergency Use Authorization (EUA). This test is only authorized for the duration of time the declaration that circumstances exist justifying the authorization of the emergency use of in vitro diagnostic tests for detection of SARS-CoV-2 virus and/or diagnosis of COVID-19 infection under  section 564(b)(1) of the Act, 21 U.S.C. 962IWL-7(L)(8), unless the authorization is terminated or revoked sooner. When diagnostic testing is negative, the possibility of a false negative result should be considered in the context of a patient's recent exposures and the presence of clinical signs and symptoms consistent with COVID-19. An individual without symptoms of COVID-19 and who is not shedding SARS-CoV-2 virus would expect to have a negative (not detected) result in this assay. Performed  At: [facility] [place], [place] [id number] Rush Farmer MD YC:[id number]    Tina  Final    Comment: Performed at [facility], [address] [place], [place] [address]    Coagulation Studies: No results for input(s): LABPROT, INR  in the last 72 hours.  Urinalysis: No results for input(s): COLORURINE, LABSPEC, PHURINE, GLUCOSEU, HGBUR, BILIRUBINUR, KETONESUR, PROTEINUR, UROBILINOGEN, NITRITE, LEUKOCYTESUR in the last 72 hours.  Invalid input(s): APPERANCEUR    Imaging: No results found.   Medications:   . sodium chloride Stopped (03/11/19 1725)   . aspirin  81 mg Oral Daily  . atorvastatin  40 mg Oral q1800  . Chlorhexidine Gluconate Cloth  6 each Topical Q0600  . citalopram  10 mg Oral Daily  . docusate sodium  100 mg Oral BID  . epoetin (EPOGEN/PROCRIT) injection  10,000 Units Intravenous Q M,W,F-HD  . feeding supplement (ENSURE ENLIVE)  237 mL Oral BID BM  . fluticasone  2 spray Each Nare Daily  . heparin  5,000 Units Subcutaneous Q8H  . hydrALAZINE  25 mg Oral TID WC  . insulin aspart  0-5 Units Subcutaneous QHS  . insulin aspart  0-9 Units Subcutaneous TID WC  . insulin aspart  7 Units Subcutaneous TID WC  . insulin glargine  24 Units Subcutaneous Daily  . lamoTRIgine  25 mg Oral BID  . levETIRAcetam  250 mg Oral BID  . loratadine  10 mg Oral Daily  . Melatonin  5 mg Oral QHS  . metoprolol tartrate  25 mg Oral BID  . multivitamin with minerals  1 tablet Oral Daily  . nystatin   Topical BID  . OLANZapine  10 mg Oral QHS  . pantoprazole  40 mg Oral BID  . polyethylene glycol  17 g Oral Daily  . senna-docusate  1 tablet Oral QHS  . sodium chloride flush  3 mL Intravenous Q12H  . vitamin B-12  1,000 mcg Oral Daily   sodium chloride, acetaminophen, albuterol, alum & mag hydroxide-simeth, clonazePAM, docusate sodium, ondansetron, oxyCODONE-acetaminophen, promethazine  Assessment/ Plan:  78 y.o. 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   Last known outpatient creatinine from December 12 is 2.69/GFR 17 Underlying CKD likely secondary to atherosclerosis, diabetes and hypertension -Renal parameters improving on dialysis.  Creatinine currently 3.0.  Urine output was 900 cc yesterday off of Lasix drip.  Continue to monitor.  Recent Labs    03/08/19 1105 03/09/19 0419 03/10/19 0611 03/12/19 1109  CREATININE 3.80* 4.23* 2.99* 3.00*    #. Anemia of CKD  Lab Results  Component Value Date   HGB 7.8 (L) 03/12/2019  Patient  to receive Epogen 10,000 units IV with dialysis today.  #.  Hyperphosphatemia     Component Value Date/Time   PTH 51 03/04/2019 0507   Lab Results  Component Value Date   PHOS 3.8 03/12/2019  Phosphorus at target at 3.8 today.  #. Diabetes type 2 with CKD Hemoglobin A1C (%)  Date Value  09/04/2014 6.2   Hgb A1c MFr Bld (%)  Date Value  04/22/2018 7.6 (H)    #Grade 2 diastolic dysfunction with pulmonary hypertension, tricuspid regurgitation, generalized edema Lasix drip has been stopped.  Urine output 900 cc over the preceding 24 hours.  Continue to monitor.  Hyperkalemia. Potassium now normalized at 4.8.   LOS: [place] 5/25/202012:17 PM  [place], [place]

## 2019-03-12 NOTE — Care Management Important Message (Signed)
Important Message  Patient Details  Name: Carolyn Valdez MRN: [id number] Date of Birth: Oct 03, 1941   Medicare Important Message Given:  Yes    Dannette Barbara 03/12/2019, 11:48 AM

## 2019-03-12 NOTE — Progress Notes (Signed)
HD Tx started    03/12/19 0945  Vital Signs  Pulse Rate (!) 56  Pulse Rate Source Monitor  Resp (!) 25  BP 126/73  BP Location Left Arm  BP Method Automatic  Patient Position (if appropriate) Lying  Oxygen Therapy  SpO2 100 %  O2 Device Nasal Cannula  O2 Flow Rate (L/min) 3 L/min  Pulse Oximetry Type Continuous  During Hemodialysis Assessment  Blood Flow Rate (mL/min) 300 mL/min  Arterial Pressure (mmHg) -190 mmHg  Venous Pressure (mmHg) 120 mmHg  Transmembrane Pressure (mmHg) 60 mmHg  Ultrafiltration Rate (mL/min) 560 mL/min  Dialysate Flow Rate (mL/min) 600 ml/min  Conductivity: Machine  13.8  Dialysis Fluid Bolus Normal Saline  Bolus Amount (mL) 250 mL  Intra-Hemodialysis Comments Tx initiated

## 2019-03-13 ENCOUNTER — Encounter: Payer: Self-pay | Admitting: Vascular Surgery

## 2019-03-13 LAB — GLUCOSE, CAPILLARY
Glucose-Capillary: 183 mg/dL — ABNORMAL HIGH (ref 70–99)
Glucose-Capillary: 122 mg/dL — ABNORMAL HIGH (ref 70–99)
Glucose-Capillary: 247 mg/dL — ABNORMAL HIGH (ref 70–99)
Glucose-Capillary: 258 mg/dL — ABNORMAL HIGH (ref 70–99)
Glucose-Capillary: 233 mg/dL — ABNORMAL HIGH (ref 70–99)

## 2019-03-13 LAB — RENAL FUNCTION PANEL
Albumin: 3.2 g/dL — ABNORMAL LOW (ref 3.5–5.0)
Anion gap: 7 (ref 5–15)
BUN: 36 mg/dL — ABNORMAL HIGH (ref 8–23)
CO2: 32 mmol/L (ref 22–32)
Calcium: 8.9 mg/dL (ref 8.9–10.3)
Chloride: 95 mmol/L — ABNORMAL LOW (ref 98–111)
Creatinine, Ser: 2.33 mg/dL — ABNORMAL HIGH (ref 0.44–1.00)
GFR calc Af Amer: 23 mL/min — ABNORMAL LOW (ref >60)
GFR calc non Af Amer: 20 mL/min — ABNORMAL LOW (ref >60)
Glucose, Bld: 187 mg/dL — ABNORMAL HIGH (ref 70–99)
Phosphorus: 3 mg/dL (ref 2.5–4.6)
Potassium: 4.8 mmol/L (ref 3.5–5.1)
Sodium: 134 mmol/L — ABNORMAL LOW (ref 135–145)

## 2019-03-13 LAB — CBC
HCT: 25.2 % — ABNORMAL LOW (ref 36.0–46.0)
Hemoglobin: 7.9 g/dL — ABNORMAL LOW (ref 12.0–15.0)
MCH: 29 pg (ref 26.0–34.0)
MCHC: 31.3 g/dL (ref 30.0–36.0)
MCV: 92.6 fL (ref 80.0–100.0)
Platelets: 225 10*3/uL (ref 150–400)
RBC: 2.72 MIL/uL — ABNORMAL LOW (ref 3.87–5.11)
RDW: 15.2 % (ref 11.5–15.5)
WBC: 8.9 10*3/uL (ref 4.0–10.5)
nRBC: 0 % (ref 0.0–0.2)

## 2019-03-13 MED ORDER — RENA-VITE PO TABS
1.0000 | ORAL_TABLET | Freq: Every day | ORAL | Status: DC
  Administered 2019-03-13 – 2019-03-18 (×6): 1 via ORAL
  Filled 2019-03-13 (×7): qty 1

## 2019-03-13 NOTE — Progress Notes (Signed)
[place] [facility] [place] [facility] NAME: Bryleigh Ottaway    MR#:  [id number]  DATE OF BIRTH:  1941-06-03  SUBJECTIVE:   Chief Complaint  Patient presents with  . Shortness of Breath   Patient is seen at the bedside. Patient is found laying in bed in NAD. Overall she feels her condition is gradually improving with hemodialysis. Voices no new complaints. No new events reported overnight.  REVIEW OF SYSTEMS:  Review of Systems  Constitutional: Negative for chills, fever, malaise/fatigue and weight loss.  HENT: Negative for congestion, hearing loss and sore throat.   Eyes: Negative for blurred vision and double vision.  Respiratory: Positive for shortness of breath and wheezing. Negative for cough.   Cardiovascular: Positive for orthopnea and leg swelling. Negative for chest pain and palpitations.  Gastrointestinal: Positive for constipation. Negative for abdominal pain, diarrhea, nausea and vomiting.  Genitourinary: Negative for dysuria and urgency.  Musculoskeletal: Positive for back pain. Negative for myalgias.  Skin: Negative for rash.  Neurological: Negative for dizziness, sensory change, speech change, focal weakness and headaches.  Psychiatric/Behavioral: Positive for depression. The patient is nervous/anxious.    DRUG ALLERGIES:   Allergies  Allergen Reactions  . Sulfa Antibiotics Hives  . Biaxin [Clarithromycin] Hives  . Influenza A (H1n1) Monoval Vac Other (See Comments)    Pt states that she was told by her MD not to get the influenza vaccine.    . Morphine Other (See Comments)    Reaction:  Dizziness and confusion   . Pyridium [Phenazopyridine Hcl] Other (See Comments)    Reaction:  Unknown   . Ceftriaxone Anxiety  . Latex Rash  . Prednisone Rash  . Tape Rash   VITALS:  Blood pressure (!) 140/50, pulse 71, temperature 98.2 F (36.8 C), temperature source Oral, resp. rate (!) 21, height 5\' 7"  (1.702 m), weight 113.8 kg, SpO2 98  %. PHYSICAL EXAMINATION:   GENERAL:  78 y.o.-year-old patient lying in the bed with no acute distress.  EYES: Pupils equal, round, reactive to light and accommodation. No scleral icterus. Extraocular muscles intact.  HEENT: Head atraumatic, normocephalic. Oropharynx and nasopharynx clear.  NECK:  Supple, no jugular venous distention. No thyroid enlargement, no tenderness.  LUNGS: Normal breath sounds bilaterally, no wheezing, rales,rhonchi or crepitation. No use of accessory muscles of respiration.  CARDIOVASCULAR: S1, S2 normal. Systolic murmur, rubs, or gallops.  ABDOMEN: Soft, nontender, nondistended. Bowel sounds present. No organomegaly or mass.  EXTREMITIES: Bilateral pitting edema of peripheral extremities, cyanosis, or clubbing.  NEUROLOGIC: Cranial nerves II through XII are intact. Muscle strength 5/5 in all extremities. Sensation intact. Gait not checked.  PSYCHIATRIC: The patient is alert and oriented x 3.  SKIN: No obvious rash, lesion, or ulcer.   DATA REVIEWED:  LABORATORY PANEL:  Female CBC Recent Labs  Lab 03/13/19 0327  WBC 8.9  HGB 7.9*  HCT 25.2*  PLT 225   ------------------------------------------------------------------------------------------------------------------ Chemistries  Recent Labs  Lab 03/13/19 0327  NA 134*  K 4.8  CL 95*  CO2 32  GLUCOSE 187*  BUN 36*  CREATININE 2.33*  CALCIUM 8.9   RADIOLOGY:  No results found. ASSESSMENT AND PLAN:   78 y.o. female with a history of diastolic congestive heart failure, chronic kidney disease, breast cancer, GI bleed, hypertension, hyperlipidemia, anxiety and depression, anemia, and diabetes mellitus presenting with worsening shortness of mass of breath.  1. Acute Congestive Heart Failure = [place] due to volume overload Last Echo 03/02/19 EF 50-55% -  Beta-Blockade: Toprol - Improving with hemodialysis - Low salt diet  -PT eval  2. Acute on Chronic Kidney disease - post hemodialysis  On 5/22 and  today - Need to monitor renal function - Nephrology following  3. 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  - Hemoglobin A1c of 7.6% on 04/22/2018 - SSI -Holding metformin due to renal function  4. Hypertension -stable with a hemodialysis -Toprol as above -Hydralazine as needed  5. Anemia of CKD:  Hgb 7.9.  Receiving Epogen with each treatment.  Iron saturation 15%, ferritin 127, 10/23/2018.   6.  Hyperkalemia -stable - Continue to monitor and replace as needed  7.  Hyperlipidemia -continue atorvastatin  8.  Anxiety and depression -continue Zyprexa and Celexa -Also on Lamictal likely for mood stabilization -On Keppra unclear rationale, no history of seizure?  Will need to be reviewed at discharge  All the records are reviewed and case discussed with Care Management/Social Worker. Management plans discussed with the patient, family and they are in agreement.  CODE STATUS: Full Code  TOTAL TIME TAKING CARE OF THIS PATIENT: 40 minutes.   More than 50% of the time was spent in counseling/coordination of care: YES  POSSIBLE D/C IN 1-2 DAYS, DEPENDING ON CLINICAL CONDITION.   on 03/13/2019 at 4:32 PM  This patient was staffed with Dr. Atha Starks, Manuella Ghazi who personally evaluated patient, reviewed documentation and agreed with assessment and plan of care as above.  Rufina Falco, DNP, FNP-BC [facility] Nurse Practitioner   Between 7am to 6pm - Pager [phone]  After 6pm go to [email or web address] - Proofreader  [facility]  Office  [phone]  CC: Primary care physician; [facility]  Note: This dictation was prepared with Dragon dictation along with smaller phrase technology. Any transcriptional errors that result from this process are unintentional.

## 2019-03-13 NOTE — Progress Notes (Signed)
Patient down for dialysis.  

## 2019-03-13 NOTE — Progress Notes (Signed)
PT Cancellation Note  Patient Details Name: RICKIYA PICARIELLO MRN: [id number] DOB: 1941/05/17   Cancelled Treatment:    Reason Eval/Treat Not Completed: Patient at procedure or test/unavailable; Pt at HD, will attempt to see pt at a future date/time as medically appropriate.    Linus Salmons PT, DPT 03/13/19, 1:30 PM

## 2019-03-13 NOTE — Progress Notes (Signed)
Following this patient for possible Hemodialysis placement. Per Dr. Juleen China, He is wanting to give the patient one more day to decide if outpatient placement is necessary. Will hold for an update.

## 2019-03-13 NOTE — Progress Notes (Signed)
Pre HD Tx     03/13/19 0950  Hand-Off documentation  Report given to (Full Name) Trellis Paganini RN  Report received from (Full Name) Laurelyn Sickle RN  Vital Signs  Temp 98.4 F (36.9 C)  Temp Source Oral  Pulse Rate 65  Pulse Rate Source Monitor  Resp 14  BP (!) 122/50  BP Location Left Arm  BP Method Automatic  Patient Position (if appropriate) Lying  Oxygen Therapy  SpO2 99 %  O2 Device Nasal Cannula  O2 Flow Rate (L/min) 3 L/min  Pain Assessment  Pain Scale 0-10  Pain Score 0  Dialysis Weight  Weight 115.8 kg  Type of Weight Pre-Dialysis  Time-Out for Hemodialysis  What Procedure? Hemodialysis  Pt Identifiers(min of two) First/Last Name;MRN/Account#  Correct Site? Yes  Correct Side? Yes  Correct Procedure? Yes  Consents Verified? Yes  Rad Studies Available? N/A  Safety Precautions Reviewed? Yes  Engineer, civil (consulting) Number 1  Station Number 2  UF/Alarm Test Passed  Conductivity: Meter 14  Conductivity: Machine  14  pH 7.2  Reverse Osmosis Main  Normal Saline Lot Number [id number]  Dialyzer Lot Number [id number]  Disposable Set Lot Number [id number]  Machine Temperature 98.6 F (37 C)  Musician and Audible Yes  Blood Lines Intact and Secured Yes  Pre Treatment Patient Checks  Vascular access used during treatment Catheter  Hepatitis B Surface Antigen Results Negative  Date Hepatitis B Surface Antigen Drawn 03/09/19  Hepatitis B Surface Antibody  (<10)  Date Hepatitis B Surface Antibody Drawn 03/09/19  Hemodialysis Consent Verified Yes  Hemodialysis Standing Orders Initiated Yes  ECG (Telemetry) Monitor On Yes  Prime Ordered Normal Saline  Length of  DialysisTreatment -hour(s) 3 Hour(s)  Dialysis Treatment Comments Na 140  Dialyzer Elisio 17H NR  Dialysate 3K, 2.5 Ca  Dialysate Flow Ordered 600  Blood Flow Rate Ordered 300 mL/min  Ultrafiltration Goal 2 Liters  Dialysis Blood Pressure Support Ordered Normal Saline  Education / Care Plan   Dialysis Education Provided Yes  Documented Education in Care Plan Yes  Hemodialysis Catheter  Placement Date: 03/09/19    Site Condition No complications  Blue Lumen Status Capped (Central line)  Red Lumen Status Capped (Central line)  Dressing Type Biopatch;Occlusive  Dressing Status Clean;Dry;Intact  Interventions New dressing  Drainage Description None

## 2019-03-13 NOTE — Plan of Care (Signed)
  Problem: Activity: Goal: Risk for activity intolerance will decrease Outcome: Progressing   Problem: Coping: Goal: Level of anxiety will decrease Outcome: Progressing   Problem: Pain Managment: Goal: General experience of comfort will improve Outcome: Progressing   Problem: Safety: Goal: Ability to remain free from injury will improve Outcome: Progressing   Problem: Skin Integrity: Goal: Risk for impaired skin integrity will decrease Outcome: Progressing   Problem: Activity: Goal: Capacity to carry out activities will improve Outcome: Progressing   

## 2019-03-13 NOTE — Progress Notes (Signed)
HD Tx Start   03/13/19 1000  Vital Signs  Pulse Rate 63  Resp 13  BP (!) 127/45  Oxygen Therapy  SpO2 98 %  O2 Device Nasal Cannula  O2 Flow Rate (L/min) 3 L/min  During Hemodialysis Assessment  Blood Flow Rate (mL/min) 300 mL/min  Arterial Pressure (mmHg) -100 mmHg  Venous Pressure (mmHg) 110 mmHg  Transmembrane Pressure (mmHg) 50 mmHg  Ultrafiltration Rate (mL/min) 830 mL/min  Dialysate Flow Rate (mL/min) 600 ml/min  Conductivity: Machine  14  HD Safety Checks Performed Yes  Dialysis Fluid Bolus Normal Saline  Bolus Amount (mL) 250 mL  Intra-Hemodialysis Comments Tx initiated  Hemodialysis Catheter  Placement Date: 03/09/19    Site Condition No complications  Blue Lumen Status Infusing  Red Lumen Status Infusing

## 2019-03-13 NOTE — Progress Notes (Addendum)
HD Tx End  2 liters removed this HD tx, tolerated well.     03/13/19 1300  Hand-Off documentation  Report given to (Full Name) Laurelyn Sickle RN  Report received from (Full Name) Trellis Paganini RN  Vital Signs  Temp 98 F (36.7 C)  Temp Source Oral  Pulse Rate 64  Pulse Rate Source Monitor  Resp (!) 21  BP (!) 134/56  BP Location Left Arm  BP Method Automatic  Patient Position (if appropriate) Lying  Oxygen Therapy  SpO2 99 %  O2 Device Nasal Cannula  O2 Flow Rate (L/min) 3 L/min  Pain Assessment  Pain Scale 0-10  Pain Score 0  Dialysis Weight  Weight 113.8 kg  Type of Weight Post-Dialysis  During Hemodialysis Assessment  Blood Flow Rate (mL/min) 200 mL/min  Arterial Pressure (mmHg) -140 mmHg  Venous Pressure (mmHg) 160 mmHg  Transmembrane Pressure (mmHg) 60 mmHg  Ultrafiltration Rate (mL/min) 830 mL/min  Dialysate Flow Rate (mL/min) 60 ml/min  Conductivity: Machine  14  HD Safety Checks Performed Yes  Dialysis Fluid Bolus Normal Saline  Bolus Amount (mL) 250 mL  Intra-Hemodialysis Comments Tx completed;Tolerated well  Post-Hemodialysis Assessment  Rinseback Volume (mL) 250 mL  Dialyzer Clearance Lightly streaked  Duration of HD Treatment -hour(s) 3 hour(s)  Hemodialysis Intake (mL) 500 mL  UF Total -Machine (mL) 2500 mL  Net UF (mL) 2000 mL  Tolerated HD Treatment Yes  Hemodialysis Catheter  Placement Date: 03/09/19    Site Condition No complications  Blue Lumen Status Flushed;Capped (Central line);Heparin locked  Red Lumen Status Flushed;Capped (Central line);Heparin locked  Catheter fill solution Heparin 1000 units/ml  Dressing Type Biopatch;Occlusive  Dressing Status Clean;Dry;Intact  Drainage Description None  Dressing Change Due 03/17/19  Post treatment catheter status Capped and Clamped

## 2019-03-13 NOTE — Progress Notes (Signed)
Initial Nutrition Assessment  RD working remotely.  DOCUMENTATION CODES:   Obesity unspecified  INTERVENTION:  Continue Ensure Enlive po BID, each supplement provides 350 kcal and 20 grams of protein.  While patient is on HD discontinue MVI and provide Rena-vite QHS.  NUTRITION DIAGNOSIS:   Increased nutrient needs related to catabolic illness(AKI on CKD IV requiring HD, CHF) as evidenced by estimated needs.  GOAL:   Patient will meet greater than or equal to 90% of their needs  MONITOR:   PO intake, Supplement acceptance, Labs, Weight trends, Skin, I & O's  REASON FOR ASSESSMENT:   LOS    ASSESSMENT:   78 year old female with PMHx of DM, HTN, anxiety, recurrent UTIs, hx breast cancer s/p right breast mastectomy in 1997, HFpEF, CKD stage IV, OSA, nephrolithiasis who is admitted with acute renal failure on CKD IV, acute on chronic diastolic CHF.   Unable to call patient over the phone for nutrition/weight history as she is down in HD. Per chart she is being evaluated daily for dialysis need. She is on a renal-carb modified diet and is eating 70-100% of meals. Currently ordered for Ensure Enlive BID. Plan is for patient to discharge to assisted living when ready. Weights in chart appear stable.  Medications reviewed and include: Colace 100 mg BID, Epogen 10000 units during HD, Novolog 0-9 units TID, Novolog 0-5 units QHS, Novolog 7 units TID, Lantus 24 units daily, Keppra, Melatonin 5 mg QHS, MVI daily, pantoprazole, Miralax 17 grams daily, senna-docusate 1 tablet daily, vitamin B12 1000 micrograms daily.  Labs reviewed: CBG 172-286, Sodium 134, Chloride 95, BUN 36, Creatinine 2.33.  NUTRITION - FOCUSED PHYSICAL EXAM:  Unable to complete at this time.  Diet Order:   Diet Order            Diet renal/carb modified with fluid restriction Diet-HS Snack? Nothing; Fluid restriction: 1200 mL Fluid; Room service appropriate? Yes; Fluid consistency: Thin  Diet effective now             EDUCATION NEEDS:   Not appropriate for education at this time  Skin:  Skin Assessment: Skin Integrity Issues:(ecchymosis; MSAD to groin and breast)  Last BM:  03/13/2019 - medium type 6  Height:   Ht Readings from Last 1 Encounters:  03/02/19 5\' 7"  (1.702 m)   Weight:   Wt Readings from Last 1 Encounters:  03/13/19 113.8 kg   Ideal Body Weight:  61.4 kg  BMI:  Body mass index is 39.29 kg/m.  Estimated Nutritional Needs:   Kcal:  2000-2200  Protein:  100-110 grams  Fluid:  UOP + 1 L  Willey Blade, MS, RD, LDN Office: [phone] Pager: [phone] After Hours/Weekend Pager: [phone]

## 2019-03-13 NOTE — Progress Notes (Addendum)
Post HD Assessment  2 liters removed, pt tolerated tx well.     03/13/19 1300  Neurological  Level of Consciousness Alert  Orientation Level Oriented X4  Respiratory  Respiratory Pattern Regular  Chest Assessment Chest expansion symmetrical  Bilateral Breath Sounds Diminished;Fine crackles  Cardiac  Pulse Regular  Heart Sounds S1, S2  ECG Monitor Yes  Cardiac Rhythm SB  Vascular  R Radial Pulse +2  L Radial Pulse +2  Edema Right lower extremity;Left lower extremity  Generalized Edema +2  RLE Edema +2  LLE Edema +2  Integumentary  Integumentary (WDL) X  Skin Condition Dry  Skin Integrity Ecchymosis;MASD  Ecchymosis Location Buttocks;Abdomen  Ecchymosis Location Orientation Right;Left  Musculoskeletal  Musculoskeletal (WDL) X  Generalized Weakness Yes  Gastrointestinal  Bowel Sounds Assessment Active  GU Assessment  Genitourinary (WDL) X  Genitourinary Symptoms External catheter (dialysis pt )  Urine Characteristics  Urine Color Yellow/straw  External Urinary Catheter  Placement Date/Time: 03/02/19 0736   External Urinary Catheter Type: Female  Collection Container Dedicated Suction Canister  Securement Method Securing device (Describe)  Psychosocial  Psychosocial (WDL) WDL

## 2019-03-13 NOTE — Progress Notes (Signed)
Patient's sister Mercie Eon called for update per patient request. All questions answered, sister appreciated call. Will continue to monitor patient.

## 2019-03-13 NOTE — Progress Notes (Addendum)
Pre HD Assessment    03/13/19 0945  Neurological  Level of Consciousness Alert  Orientation Level Oriented X4  Respiratory  Respiratory Pattern Regular  Chest Assessment Chest expansion symmetrical  Bilateral Breath Sounds Diminished;Fine crackles  Cardiac  Pulse Regular  Heart Sounds S1, S2  ECG Monitor Yes  Cardiac Rhythm SB  Vascular  R Radial Pulse +2  L Radial Pulse +2  Edema Right lower extremity;Left lower extremity  Generalized Edema +2  RLE Edema +2  LLE Edema +2  Integumentary  Integumentary (WDL) X  Skin Condition Dry  Skin Integrity Ecchymosis;MASD  Ecchymosis Location Buttocks;Abdomen  Ecchymosis Location Orientation Right;Left  Musculoskeletal  Musculoskeletal (WDL) X  Generalized Weakness Yes  Gastrointestinal  Bowel Sounds Assessment Active  Last BM Date 03/13/19  GU Assessment  Genitourinary (WDL) X  Genitourinary Symptoms External catheter (dialysis pt )  Urine Characteristics  Urine Color Yellow/straw  External Urinary Catheter  Placement Date/Time: 03/02/19 0736   External Urinary Catheter Type: Female  Collection Container Dedicated Suction Canister  Securement Method Securing device (Describe)  Output (mL) 200 mL  Psychosocial  Psychosocial (WDL) WDL

## 2019-03-13 NOTE — Progress Notes (Signed)
[facility]  ROUNDING NOTE   Subjective:   Seen and examined on hemodialysis treatment. Fourth Treatment.     HEMODIALYSIS FLOWSHEET:  Blood Flow Rate (mL/min): 300 mL/min Arterial Pressure (mmHg): -190 mmHg Venous Pressure (mmHg): 110 mmHg Transmembrane Pressure (mmHg): 60 mmHg Ultrafiltration Rate (mL/min): 560 mL/min Dialysate Flow Rate (mL/min): 600 ml/min Conductivity: Machine : 13.8 Conductivity: Machine : 13.8 Dialysis Fluid Bolus: Normal Saline Bolus Amount (mL): 250 mL    Objective:  Vital signs in last 24 hours:  Temp:  [97.7 F (36.5 C)-99.9 F (37.7 C)] 97.7 F (36.5 C) (05/26 0749) Pulse Rate:  [52-73] 54 (05/26 0749) Resp:  [18-26] 18 (05/25 1638) BP: (112-157)/(32-73) 135/47 (05/26 0749) SpO2:  [96 %-100 %] 98 % (05/26 0749) Weight:  [114.4 kg-115.8 kg] 115.8 kg (05/26 0505)  Weight change: -0.031 kg Filed Weights   03/12/19 1247 03/12/19 1321 03/13/19 0505  Weight: 114.5 kg 114.4 kg 115.8 kg    Intake/Output: I/O last 3 completed shifts: In: -  Out: 2307 [Urine:1300; Other:1007]   Intake/Output this shift:  No intake/output data recorded.  Physical Exam: General: NAD, laying in bed  Head: Normocephalic, atraumatic. Moist oral mucosal membranes  Eyes: Anicteric, PERRL  Neck: Supple, trachea midline  Lungs:  Clear to auscultation, 3 L [place]  Heart: Regular rate and rhythm  Abdomen:  Soft, nontender, obese  Extremities:  trace - 1+ peripheral edema.  Neurologic: Nonfocal, moving all four extremities  Skin: No lesions  Access: Left femoral temp HD catheter Dr. Lucky Cowboy 9/41    Basic Metabolic Panel: Recent Labs  Lab 03/08/19 1105 03/09/19 0419 03/10/19 0611 03/10/19 1708 03/12/19 1109 03/13/19 0327  NA 138 136 139  --  135 134*  K 4.9 5.2* 4.8  --  4.8 4.8  CL 93* 92* 97*  --  95* 95*  CO2 34* 36* 35*  --  33* 32  GLUCOSE 249* 224* 234*  --  201* 187*  BUN 85* 91* 67*  --  59* 36*  CREATININE 3.80* 4.23* 2.99*  --  3.00*  2.33*  CALCIUM 9.5 9.0 9.1  --  9.5 8.9  PHOS  --  4.5 3.8 4.1 3.8 3.0    Liver Function Tests: Recent Labs  Lab 03/07/19 0319 03/10/19 0611 03/12/19 1109 03/13/19 0327  ALBUMIN 3.1* 3.1* 3.1* 3.2*   No results for input(s): LIPASE, AMYLASE in the last 168 hours. No results for input(s): AMMONIA in the last 168 hours.  CBC: Recent Labs  Lab 03/12/19 0727 03/13/19 0327  WBC 7.6 8.9  HGB 7.8* 7.9*  HCT 25.4* 25.2*  MCV 94.1 92.6  PLT 221 225    Cardiac Enzymes: No results for input(s): CKTOTAL, CKMB, CKMBINDEX, TROPONINI in the last 168 hours.  BNP: Invalid input(s): POCBNP  CBG: Recent Labs  Lab 03/12/19 0737 03/12/19 1320 03/12/19 1635 03/12/19 2052 03/13/19 0750  GLUCAP 221* 172* 203* 286* 183*    Microbiology: Results for orders placed or performed during the hospital encounter of 03/02/19  SARS Coronavirus 2 (CEPHEID - Performed in [place] hospital lab), Hosp Order     Status: None   Collection Time: 03/02/19  6:35 AM  Result Value Ref Range Status   SARS Coronavirus 2 NEGATIVE NEGATIVE Final    Comment: (NOTE) If result is NEGATIVE SARS-CoV-2 target nucleic acids are NOT DETECTED. The SARS-CoV-2 RNA is generally detectable in upper and lower  respiratory specimens during the acute phase of infection. The lowest  concentration of SARS-CoV-2 viral copies  this assay can detect is 250  copies / mL. A negative result does not preclude SARS-CoV-2 infection  and should not be used as the sole basis for treatment or other  patient management decisions.  A negative result may occur with  improper specimen collection / handling, submission of specimen other  than nasopharyngeal swab, presence of viral mutation(s) within the  areas targeted by this assay, and inadequate number of viral copies  (<250 copies / mL). A negative result must be combined with clinical  observations, patient history, and epidemiological information. If result is  POSITIVE SARS-CoV-2 target nucleic acids are DETECTED. The SARS-CoV-2 RNA is generally detectable in upper and lower  respiratory specimens dur ing the acute phase of infection.  Positive  results are indicative of active infection with SARS-CoV-2.  Clinical  correlation with patient history and other diagnostic information is  necessary to determine patient infection status.  Positive results do  not rule out bacterial infection or co-infection with other viruses. If result is PRESUMPTIVE POSTIVE SARS-CoV-2 nucleic acids MAY BE PRESENT.   A presumptive positive result was obtained on the submitted specimen  and confirmed on repeat testing.  While 2019 novel coronavirus  (SARS-CoV-2) nucleic acids may be present in the submitted sample  additional confirmatory testing may be necessary for epidemiological  and / or clinical management purposes  to differentiate between  SARS-CoV-2 and other Sarbecovirus currently known to infect humans.  If clinically indicated additional testing with an alternate test  methodology [phone]) is advised. The SARS-CoV-2 RNA is generally  detectable in upper and lower respiratory sp ecimens during the acute  phase of infection. The expected result is Negative. Fact Sheet for Patients:  StrictlyIdeas.no Fact Sheet for Healthcare Providers: BankingDealers.co.za This test is not yet approved or cleared by the [place] FDA and has been authorized for detection and/or diagnosis of SARS-CoV-2 by FDA under an Emergency Use Authorization (EUA).  This EUA will remain in effect (meaning this test can be used) for the duration of the COVID-19 declaration under Section 564(b)(1) of the Act, 21 U.S.C. section 360bbb-3(b)(1), unless the authorization is terminated or revoked sooner. Performed at [facility], [address] [place], [place] [address]   MRSA PCR Screening     Status: None   Collection  Time: 03/06/19  8:15 PM  Result Value Ref Range Status   MRSA by PCR NEGATIVE NEGATIVE Final    Comment:        The GeneXpert MRSA Assay (FDA approved for NASAL specimens only), is one component of a comprehensive MRSA colonization surveillance program. It is not intended to diagnose MRSA infection nor to guide or monitor treatment for MRSA infections. Performed at [facility], [address] [place],  [address]   Novel Coronavirus, NAA (hospital order; send-out to ref lab)     Status: None   Collection Time: 03/08/19  6:34 PM  Result Value Ref Range Status   SARS-CoV-2, NAA NOT DETECTED NOT DETECTED Final    Comment: (NOTE) This test was developed and its performance characteristics determined by [employer]. This test has not been FDA cleared or approved. This test has been authorized by FDA under an Emergency Use Authorization (EUA). This test is only authorized for the duration of time the declaration that circumstances exist justifying the authorization of the emergency use of in vitro diagnostic tests for detection of SARS-CoV-2 virus and/or diagnosis of COVID-19 infection under section 564(b)(1) of the Act, 21 U.S.C. 127NTZ-0(Y)(1), unless  the authorization is terminated or revoked sooner. When diagnostic testing is negative, the possibility of a false negative result should be considered in the context of a patient's recent exposures and the presence of clinical signs and symptoms consistent with COVID-19. An individual without symptoms of COVID-19 and who is not shedding SARS-CoV-2 virus would expect to have a negative (not detected) result in this assay. Performed  At: [facility] [place], [place] [id number] Rush Farmer MD ME:[id number]    [place]  Final    Comment: Performed at [facility], [address] [place], [place] [address]    Coagulation Studies: No results for  input(s): LABPROT, INR in the last 72 hours.  Urinalysis: No results for input(s): COLORURINE, LABSPEC, PHURINE, GLUCOSEU, HGBUR, BILIRUBINUR, KETONESUR, PROTEINUR, UROBILINOGEN, NITRITE, LEUKOCYTESUR in the last 72 hours.  Invalid input(s): APPERANCEUR    Imaging: No results found.   Medications:   . sodium chloride Stopped (03/11/19 1725)   . aspirin  81 mg Oral Daily  . atorvastatin  40 mg Oral q1800  . Chlorhexidine Gluconate Cloth  6 each Topical Q0600  . citalopram  10 mg Oral Daily  . docusate sodium  100 mg Oral BID  . epoetin (EPOGEN/PROCRIT) injection  10,000 Units Intravenous Q M,W,F-HD  . feeding supplement (ENSURE ENLIVE)  237 mL Oral BID BM  . fluticasone  2 spray Each Nare Daily  . heparin  5,000 Units Subcutaneous Q8H  . hydrALAZINE  25 mg Oral TID WC  . insulin aspart  0-5 Units Subcutaneous QHS  . insulin aspart  0-9 Units Subcutaneous TID WC  . insulin aspart  7 Units Subcutaneous TID WC  . insulin glargine  24 Units Subcutaneous Daily  . lamoTRIgine  25 mg Oral BID  . levETIRAcetam  250 mg Oral BID  . loratadine  10 mg Oral Daily  . Melatonin  5 mg Oral QHS  . metoprolol tartrate  25 mg Oral BID  . multivitamin with minerals  1 tablet Oral Daily  . nystatin   Topical BID  . OLANZapine  10 mg Oral QHS  . pantoprazole  40 mg Oral BID  . polyethylene glycol  17 g Oral Daily  . senna-docusate  1 tablet Oral QHS  . sodium chloride flush  3 mL Intravenous Q12H  . vitamin B-12  1,000 mcg Oral Daily   sodium chloride, acetaminophen, albuterol, alum & mag hydroxide-simeth, clonazePAM, docusate sodium, ondansetron, oxyCODONE-acetaminophen, promethazine  Assessment/ Plan:  [name] is a 78 y.o. white female with congestive heart failure, hypertension, diabetes mellitus type II, obstructive sleep apnea, nephrolithiasis   1. Acute renal failure on chronic kidney disease stage IV: baseline creatinine of 2.69, GFR of 17. First dialysis was 5/22.  Chronic  kidney disease secondary to diabetic nephropathy Acute renal failure secondary to acute cardiorenal syndrome.  Seen and examined on hemodialysis treatment. Tolerating well.  Evaluate daily for dialysis need.   2. Anemia of chronic kidney disease: hemoglobin 7.9. EPO with HD treatment yesterday.   3. Secondary Hyperparathyroidism: PTH low at 51. Calcium and phosphorus at goal.   4. 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 With proteinuria. History of poor control. Hemoglobin A1c of 7.6% on 04/22/2018  5. Hypertension with diastolic dysfunction congestive heart failure, pulmonary hypertension. Furosemide gtt with limited success, therefore transitioned to intermittent hemodialysis treatments.  Volume status has improved significantly.    LOS: [place] 5/26/20209:44 AM

## 2019-03-14 LAB — CBC WITH DIFFERENTIAL/PLATELET
Abs Immature Granulocytes: 0.11 10*3/uL — ABNORMAL HIGH (ref 0.00–0.07)
Basophils Absolute: 0.1 10*3/uL (ref 0.0–0.1)
Basophils Relative: 1 %
Eosinophils Absolute: 0.5 10*3/uL (ref 0.0–0.5)
Eosinophils Relative: 6 %
HCT: 26.7 % — ABNORMAL LOW (ref 36.0–46.0)
Hemoglobin: 8.3 g/dL — ABNORMAL LOW (ref 12.0–15.0)
Immature Granulocytes: 1 %
Lymphocytes Relative: 21 %
Lymphs Abs: 1.8 10*3/uL (ref 0.7–4.0)
MCH: 29.2 pg (ref 26.0–34.0)
MCHC: 31.1 g/dL (ref 30.0–36.0)
MCV: 94 fL (ref 80.0–100.0)
Monocytes Absolute: 1.1 10*3/uL — ABNORMAL HIGH (ref 0.1–1.0)
Monocytes Relative: 13 %
Neutro Abs: 4.9 10*3/uL (ref 1.7–7.7)
Neutrophils Relative %: 58 %
Platelets: 237 10*3/uL (ref 150–400)
RBC: 2.84 MIL/uL — ABNORMAL LOW (ref 3.87–5.11)
RDW: 15.7 % — ABNORMAL HIGH (ref 11.5–15.5)
WBC: 8.4 10*3/uL (ref 4.0–10.5)
nRBC: 0 % (ref 0.0–0.2)

## 2019-03-14 LAB — BASIC METABOLIC PANEL
Anion gap: 9 (ref 5–15)
BUN: 29 mg/dL — ABNORMAL HIGH (ref 8–23)
CO2: 32 mmol/L (ref 22–32)
Calcium: 9.2 mg/dL (ref 8.9–10.3)
Chloride: 93 mmol/L — ABNORMAL LOW (ref 98–111)
Creatinine, Ser: 2.42 mg/dL — ABNORMAL HIGH (ref 0.44–1.00)
GFR calc Af Amer: 22 mL/min — ABNORMAL LOW (ref >60)
GFR calc non Af Amer: 19 mL/min — ABNORMAL LOW (ref >60)
Glucose, Bld: 244 mg/dL — ABNORMAL HIGH (ref 70–99)
Potassium: 4.8 mmol/L (ref 3.5–5.1)
Sodium: 134 mmol/L — ABNORMAL LOW (ref 135–145)

## 2019-03-14 LAB — GLUCOSE, CAPILLARY
Glucose-Capillary: 216 mg/dL — ABNORMAL HIGH (ref 70–99)
Glucose-Capillary: 241 mg/dL — ABNORMAL HIGH (ref 70–99)
Glucose-Capillary: 289 mg/dL — ABNORMAL HIGH (ref 70–99)
Glucose-Capillary: 182 mg/dL — ABNORMAL HIGH (ref 70–99)

## 2019-03-14 LAB — MAGNESIUM: Magnesium: 2.5 mg/dL — ABNORMAL HIGH (ref 1.7–2.4)

## 2019-03-14 MED ORDER — INSULIN GLARGINE 100 UNIT/ML ~~LOC~~ SOLN
30.0000 [IU] | Freq: Every day | SUBCUTANEOUS | Status: DC
  Administered 2019-03-14 – 2019-03-15 (×2): 30 [IU] via SUBCUTANEOUS
  Filled 2019-03-14 (×3): qty 0.3

## 2019-03-14 NOTE — Evaluation (Addendum)
Occupational Therapy Evaluation Patient Details Name: Carolyn Valdez MRN: [id number] DOB: 01-09-1941 Today's Date: 03/14/2019    History of Present Illness  Pt. is a 78 y.o. female with a known history of diastolic congestive heart failure, chronic kidney disease, breast cancer, chest pain, GI bleed, hypertension, hyperlipidemia-lives in [place], for last few days she has worsening shortness of breath so came to emergency room for further management. Patient says she chronically sleeps in a recliner.  She does not have a primary cardiologist in the clinic. This time she denied any associated chest pain or palpitation.  She denied any associated cough fever or sputum production. In ER she was noted to have acute CHF exacerbation and given to hospitalist team. She is currently admitted for acute on chronic CHF, elevated troponin, and AOCKD IV-V.  Right femoral temp HD cath placement 03/09/19.   Clinical Impression   Pt. Presents with weakness, limited activity tolerance, and limited functional mobility which limits the ability to complete basic ADL and IADL functioning. Pt. Seen for a bedside eval secondary to having a temporary HD femoral catheter in place. Pt. Resides at home alone. Pt. resides at [facility]. Caregivers assist pt. with morning ADLs, meals, and medication management. Pt. uses a walker to use the bathroom. Pt. education was provided about energy conservation, pursed lip breathing, and A/E use for ADLs.So2 97%, HR 58. Pt. could benefit from OT services for ADL training, A/E training, and pt. education and energy conservation, work simplification techniques. Pt. Would benefit from follow-up HHOT upon return to ALF.     Follow Up Recommendations  Home health OT    Equipment Recommendations  None recommended by OT    Recommendations for Other Services       Precautions / Restrictions        Mobility Bed Mobility                  Transfers                  General transfer comment: Deferred secondary to temporary HD catheter    Balance                                           ADL either performed or assessed with clinical judgement   ADL Overall ADL's : Needs assistance/impaired Eating/Feeding: Independent;Set up;Bed level   Grooming: Set up;Minimal assistance;Bed level   Upper Body Bathing: Set up;Minimal assistance   Lower Body Bathing: Set up;Maximal assistance   Upper Body Dressing : Set up;Minimal assistance   Lower Body Dressing: Set up;Maximal assistance                 General ADL Comments: Pt. education was provided about general A/E use for ADLS, and energy conservation techniques.     Vision Baseline Vision/History: Wears glasses(Not available) Wears Glasses: Reading only       Perception     Praxis      Pertinent Vitals/Pain       Hand Dominance  Right   Extremity/Trunk Assessment  generalized weakness           Communication     Cognition  Alert and O x 3  General Comments       Exercises     Shoulder Instructions      Home Living                                          Prior Functioning/Environment                   OT Problem List: Decreased strength;Decreased activity tolerance;Decreased knowledge of use of DME or AE;Pain;Impaired UE functional use;Decreased coordination      OT Treatment/Interventions: Self-care/ADL training;Therapeutic exercise;Neuromuscular education;Patient/family education;DME and/or AE instruction;Therapeutic activities    OT Goals(Current goals can be found in the care plan section) Acute Rehab OT Goals Patient Stated Goal: To get better OT Goal Formulation: With patient Potential to Achieve Goals: Good  OT Frequency: Min 2X/week   Barriers to D/C:            Co-evaluation              AM-PAC OT "6 Clicks" Daily Activity      Outcome Measure Help from another person eating meals?: None Help from another person taking care of personal grooming?: A Little Help from another person toileting, which includes using toliet, bedpan, or urinal?: A Lot Help from another person bathing (including washing, rinsing, drying)?: A Lot Help from another person to put on and taking off regular upper body clothing?: A Little Help from another person to put on and taking off regular lower body clothing?: A Lot 6 Click Score: 16   End of Session    Activity Tolerance: Patient tolerated treatment well Patient left: in bed;with call bell/phone within reach;with bed alarm set  OT Visit Diagnosis: Muscle weakness (generalized) (M62.81)                Time: 1552-0802 OT Time Calculation (min): 20 min Charges:  OT General Charges $OT Visit: 1 Visit OT Evaluation $OT Eval Moderate Complexity: 1 Mod  Harrel Carina, MS, OTR/L  Harrel Carina 03/14/2019, 3:59 PM

## 2019-03-14 NOTE — Progress Notes (Signed)
OT Cancellation Note  Patient Details Name: Carolyn Valdez MRN: [id number] DOB: [birth year]   Cancelled Treatment:    Reason Eval/Treat Not Completed: Fatigue/lethargy limiting ability to participate;Patient declined, no reason specified. Thank you for the OT consult. Order received and chart reviewed. Upon arrival to floor this OT spoke with RN who stated pt has remained asleep and did not rouse to VC's earlier this am.  OT attempted to rouse pt with VC's and sternal rub. Upon sternal rub pt stated "please stop", but otherwise kept eyes closed and did not respond to this therapist. Will re-attempt at a later time/date as available and pt medically appropriate for OT eval.   Shara Blazing, M.S., OTR/L Ascom: [phone] 03/14/19, 9:08 AM

## 2019-03-14 NOTE — Plan of Care (Signed)
  Problem: Clinical Measurements: Goal: Ability to maintain clinical measurements within normal limits will improve Outcome: Progressing Goal: Diagnostic test results will improve Outcome: Progressing   Problem: Elimination: Goal: Will not experience complications related to urinary retention Outcome: Progressing   Problem: Pain Managment: Goal: General experience of comfort will improve Outcome: Progressing   Problem: Safety: Goal: Ability to remain free from injury will improve Outcome: Progressing   Problem: Skin Integrity: Goal: Risk for impaired skin integrity will decrease Outcome: Progressing   Problem: Education: Goal: Ability to verbalize understanding of medication therapies will improve Outcome: Progressing

## 2019-03-14 NOTE — Progress Notes (Signed)
Physical Therapy Treatment Patient Details Name: Carolyn Valdez MRN: [id number] DOB: January 21, 1941 Today's Date: 03/14/2019    History of Present Illness  78 y.o. female with a known history of diastolic congestive heart failure, chronic kidney disease, breast cancer, chest pain, GI bleed, hypertension, hyperlipidemia-lives in [place], for last few days she has worsening shortness of breath so came to emergency room for further management. Patient says she chronically sleeps in a recliner.  She does not have a primary cardiologist in the clinic. This time she denied any associated chest pain or palpitation.  She denied any associated cough fever or sputum production. In ER she was noted to have acute CHF exacerbation and given to hospitalist team. She is currently admitted for acute on chronic CHF, elevated troponin, and AOCKD IV-V.  Right femoral temp HD cath placement 03/09/19.    PT Comments    Pt presents with deficits in strength, transfers, mobility, gait, balance, and activity tolerance.  Pt session limited to below therex and HEP training secondary to pt with a right temporary HD femoral catheter.  Pt will benefit from HHPT services upon discharge to safely address above deficits for decreased caregiver assistance and eventual return to PLOF.     Follow Up Recommendations  Home health PT;Other (comment)(Return to ALF with PT services)     Equipment Recommendations  None recommended by PT    Recommendations for Other Services       Precautions / Restrictions Precautions Precautions: Fall Precaution Comments: Right temporary HD femoral cath placement 5/22 Restrictions Weight Bearing Restrictions: No    Mobility  Bed Mobility               General bed mobility comments: NT secondary to temporary HD femoral catheter placement  Transfers                 General transfer comment: NT secondary to temporary HD femoral catheter placement  Ambulation/Gait              General Gait Details: NT secondary to temporary HD femoral catheter placement   Stairs             Wheelchair Mobility    Modified Rankin (Stroke Patients Only)       Balance                                            Cognition Arousal/Alertness: Awake/alert Behavior During Therapy: Flat affect Overall Cognitive Status: Within Functional Limits for tasks assessed                                        Exercises Total Joint Exercises Ankle Circles/Pumps: AROM;Both;5 reps;10 reps Quad Sets: Strengthening;Both;5 reps;10 reps Gluteal Sets: Strengthening;Both;5 reps;10 reps Short Arc Quad: Left;Strengthening;10 reps Heel Slides: AROM;AAROM;Left;10 reps Hip ABduction/ADduction: AROM;AAROM;Left;10 reps Straight Leg Raises: AROM;AAROM;Left;10 reps Other Exercises Other Exercises: Gentle RLE isometric hip abd x 10 Other Exercises: HEP education and review for BLE APs, QS, and GS x 10 each every 1-2 hours daily    General Comments        Pertinent Vitals/Pain Pain Assessment: No/denies pain    Home Living                      Prior  Function            PT Goals (current goals can now be found in the care plan section) Progress towards PT goals: Not progressing toward goals - comment(Pt limited by temporary fem cath placement)    Frequency    Min 2X/week      PT Plan Current plan remains appropriate    Co-evaluation              AM-PAC PT "6 Clicks" Mobility   Outcome Measure                   End of Session Equipment Utilized During Treatment: Oxygen Activity Tolerance: Patient tolerated treatment well Patient left: with bed alarm set;with call bell/phone within reach;in bed Nurse Communication: Mobility status PT Visit Diagnosis: Unsteadiness on feet (R26.81);Muscle weakness (generalized) (M62.81)     Time: 3704-8889 PT Time Calculation (min) (ACUTE ONLY): 18 min  Charges:   $Therapeutic Exercise: 8-22 mins                     D. Scott Tanique Matney PT, DPT 03/14/19, 10:32 AM

## 2019-03-14 NOTE — Progress Notes (Signed)
[facility] [place] NAME: Carolyn Valdez    MR#:  [id number]  DATE OF BIRTH:  [birth year]  SUBJECTIVE:   Chief Complaint  Patient presents with  . Shortness of Breath   Patient is seen at the bedside. Patient is found laying in bed in NAD. She is refusing to be examined, state that she wants to bel left alone.  REVIEW OF SYSTEMS:  Review of Systems  Constitutional: Negative for chills, fever, malaise/fatigue and weight loss.  HENT: Negative for congestion, hearing loss and sore throat.   Eyes: Negative for blurred vision and double vision.  Respiratory: Positive for shortness of breath and wheezing. Negative for cough.   Cardiovascular: Positive for orthopnea and leg swelling. Negative for chest pain and palpitations.  Gastrointestinal: Positive for constipation. Negative for abdominal pain, diarrhea, nausea and vomiting.  Genitourinary: Negative for dysuria and urgency.  Musculoskeletal: Positive for back pain. Negative for myalgias.  Skin: Negative for rash.  Neurological: Negative for dizziness, sensory change, speech change, focal weakness and headaches.  Psychiatric/Behavioral: Positive for depression. The patient is nervous/anxious.    DRUG ALLERGIES:   Allergies  Allergen Reactions  . Sulfa Antibiotics Hives  . Biaxin [Clarithromycin] Hives  . Influenza A (H1n1) Monoval Vac Other (See Comments)    Pt states that she was told by her MD not to get the influenza vaccine.    . Morphine Other (See Comments)    Reaction:  Dizziness and confusion   . Pyridium [Phenazopyridine Hcl] Other (See Comments)    Reaction:  Unknown   . Ceftriaxone Anxiety  . Latex Rash  . Prednisone Rash  . Tape Rash   VITALS:  Blood pressure 102/60, pulse 60, temperature 98.3 F (36.8 C), temperature source Oral, resp. rate 20, height 5\' 7"  (1.702 m), weight 113.6 kg, SpO2 97 %. PHYSICAL EXAMINATION:   GENERAL:  78 y.o.-year-old patient lying in the  bed with no acute distress.  EYES: Pupils equal, round, reactive to light and accommodation. No scleral icterus. Extraocular muscles intact.  HEENT: Head atraumatic, normocephalic. Oropharynx and nasopharynx clear.  NECK:  Supple, no jugular venous distention. No thyroid enlargement, no tenderness.  LUNGS: Normal breath sounds bilaterally, no wheezing, rales,rhonchi or crepitation. No use of accessory muscles of respiration.  CARDIOVASCULAR: S1, S2 normal. Systolic murmur, rubs, or gallops.  ABDOMEN: Soft, nontender, nondistended. Bowel sounds present. No organomegaly or mass.  EXTREMITIES: Bilateral pitting edema of peripheral extremities, cyanosis, or clubbing.  NEUROLOGIC: Cranial nerves II through XII are intact. Muscle strength 5/5 in all extremities. Sensation intact. Gait not checked.  PSYCHIATRIC: The patient is alert and oriented x 3.  SKIN: No obvious rash, lesion, or ulcer.   DATA REVIEWED:  LABORATORY PANEL:  Female CBC Recent Labs  Lab 03/14/19 0331  WBC 8.4  HGB 8.3*  HCT 26.7*  PLT 237   ------------------------------------------------------------------------------------------------------------------ Chemistries  Recent Labs  Lab 03/14/19 0331  NA 134*  K 4.8  CL 93*  CO2 32  GLUCOSE 244*  BUN 29*  CREATININE 2.42*  CALCIUM 9.2  MG 2.5*   RADIOLOGY:  No results found. ASSESSMENT AND PLAN:   78 y.o. female with a history of diastolic congestive heart failure, chronic kidney disease, breast cancer, GI bleed, hypertension, hyperlipidemia, anxiety and depression, anemia, and diabetes mellitus presenting with worsening shortness of mass of breath.  1. Acute Congestive Heart Failure = Likely due to volume overload Last Echo 03/02/19 EF 50-55% - Beta-Blockade: Toprol -  Improving with hemodialysis, holding HD today per Nephrology - Low salt diet  - PT eval  2. Acute on Chronic Kidney disease - post hemodialysis , holding HD today - Need to monitor renal  function - Nephrology following  3. 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  - Hemoglobin A1c of 7.6% on 04/22/2018 - SSI -Holding metformin due to renal function  4. Hypertension -stable with a hemodialysis -Toprol as above - Hydralazine as needed  5. Anemia of CKD:  Hgb 8.3.  Receiving Epogen with each treatment.  Iron saturation 15%, ferritin 127, 10/23/2018.   6.  Hyperkalemia -stable - Continue to monitor and replace as needed  7.  Hyperlipidemia -continue atorvastatin  8.  Anxiety and depression -continue Zyprexa and Celexa -Also on Lamictal likely for mood stabilization -On Keppra unclear rationale, no history of seizure?    All the records are reviewed and case discussed with Care Management/Social Worker. Management plans discussed with the patient, family and they are in agreement.  CODE STATUS: Full Code  TOTAL TIME TAKING CARE OF THIS PATIENT: 37 minutes.   More than 50% of the time was spent in counseling/coordination of care: YES  POSSIBLE D/C IN 1-2 DAYS, DEPENDING ON CLINICAL CONDITION.   on 03/14/2019 at 2:56 PM  This patient was staffed with Dr. Atha Starks, Manuella Ghazi who personally evaluated patient, reviewed documentation and agreed with assessment and plan of care as above.  Rufina Falco, DNP, FNP-BC [facility] Nurse Practitioner   Between 7am to 6pm - Pager [phone]  After 6pm go to [email or web address] - Proofreader  [facility]  Office  [phone]  CC: Primary care physician; [facility]  Note: This dictation was prepared with Dragon dictation along with smaller phrase technology. Any transcriptional errors that result from this process are unintentional.

## 2019-03-14 NOTE — Plan of Care (Signed)
  Problem: Education: Goal: Knowledge of General Education information will improve Description Including pain rating scale, medication(s)/side effects and non-pharmacologic comfort measures Outcome: Progressing   Problem: Health Behavior/Discharge Planning: Goal: Ability to manage health-related needs will improve Outcome: Progressing   Problem: Clinical Measurements: Goal: Respiratory complications will improve Outcome: Progressing Note:  Patient on baseline oxygen.   Problem: Nutrition: Goal: Adequate nutrition will be maintained Outcome: Progressing

## 2019-03-14 NOTE — Progress Notes (Signed)
Inpatient Diabetes Program Recommendations  AACE/ADA: New Consensus Statement on Inpatient Glycemic Control (2015)  Target Ranges:  Prepandial:   less than 140 mg/dL      Peak postprandial:   less than 180 mg/dL (1-2 hours)      Critically ill patients:  140 - 180 mg/dL   Lab Results  Component Value Date   GLUCAP 241 (H) 03/14/2019   HGBA1C 7.6 (H) 04/22/2018    Review of Glycemic Control Results for SAADIA, DEWITT (MRN [id number]) as of 03/14/2019 11:55  Ref. Range 03/13/2019 07:50 03/13/2019 14:02 03/13/2019 16:17 03/13/2019 21:17 03/13/2019 23:03 03/14/2019 07:28 03/14/2019 11:50  Glucose-Capillary Latest Ref Range: 70 - 99 mg/dL 183 (H) 122 (H) 247 (H) 258 (H) 233 (H) 216 (H) 241 (H)   Diabetes history: DM 2 Outpatient Diabetes medications:  Glucotrol 5 mg daily, Lantus 55 units daily, Novolog- 5 units if > 200 mg/dL Current orders for Inpatient glycemic control:  Novolog sensitive tid with meals and HS Lantus 24 units daily Novolog 7 units tid with meals Inpatient Diabetes Program Recommendations:  May consider increasing Lantus to 30 units daily.   Thanks  Adah Perl, RN, BC-ADM Inpatient Diabetes Coordinator Pager [phone] (8a-5p)

## 2019-03-14 NOTE — Progress Notes (Signed)
[facility]  ROUNDING NOTE   Subjective:   Hemodialysis treatment yesterday. Fourth treatment. Tolerated well. UF of 2 liters.  Oliguric urine output.   Objective:  Vital signs in last 24 hours:  Temp:  [97.5 F (36.4 C)-98.3 F (36.8 C)] 98.3 F (36.8 C) (05/27 0727) Pulse Rate:  [56-72] 60 (05/27 1205) Resp:  [20] 20 (05/27 0436) BP: (102-145)/(44-60) 102/60 (05/27 1205) SpO2:  [97 %-99 %] 97 % (05/27 0727) Weight:  [113.6 kg] 113.6 kg (05/27 0443)  Weight change: 0.3 kg Filed Weights   03/13/19 0950 03/13/19 1300 03/14/19 0443  Weight: 115.8 kg 113.8 kg 113.6 kg    Intake/Output: I/O last 3 completed shifts: In: 3 [I.V.:3] Out: 3275 [Urine:1275; Other:2000]   Intake/Output this shift:  Total I/O In: 3 [I.V.:3] Out: 100 [Urine:100]  Physical Exam: General: NAD, laying in bed  Head: Normocephalic, atraumatic. Moist oral mucosal membranes  Eyes: Anicteric, PERRL  Neck: Supple, trachea midline  Lungs:  Clear to auscultation, 3 L [place]  Heart: Regular rate and rhythm  Abdomen:  Soft, nontender, obese  Extremities:  trace - 1+ peripheral edema.  Neurologic: Nonfocal, moving all four extremities  Skin: No lesions  Access: Left femoral temp HD catheter Dr. Lucky Cowboy 8/25    Basic Metabolic Panel: Recent Labs  Lab 03/09/19 0419 03/10/19 0611 03/10/19 1708 03/12/19 1109 03/13/19 0327 03/14/19 0331  NA 136 139  --  135 134* 134*  K 5.2* 4.8  --  4.8 4.8 4.8  CL 92* 97*  --  95* 95* 93*  CO2 36* 35*  --  33* 32 32  GLUCOSE 224* 234*  --  201* 187* 244*  BUN 91* 67*  --  59* 36* 29*  CREATININE 4.23* 2.99*  --  3.00* 2.33* 2.42*  CALCIUM 9.0 9.1  --  9.5 8.9 9.2  MG  --   --   --   --   --  2.5*  PHOS 4.5 3.8 4.1 3.8 3.0  --     Liver Function Tests: Recent Labs  Lab 03/10/19 0611 03/12/19 1109 03/13/19 0327  ALBUMIN 3.1* 3.1* 3.2*   No results for input(s): LIPASE, AMYLASE in the last 168 hours. No results for input(s): AMMONIA in the last  168 hours.  CBC: Recent Labs  Lab 03/12/19 0727 03/13/19 0327 03/14/19 0331  WBC 7.6 8.9 8.4  NEUTROABS  --   --  4.9  HGB 7.8* 7.9* 8.3*  HCT 25.4* 25.2* 26.7*  MCV 94.1 92.6 94.0  PLT 221 225 237    Cardiac Enzymes: No results for input(s): CKTOTAL, CKMB, CKMBINDEX, TROPONINI in the last 168 hours.  BNP: Invalid input(s): POCBNP  CBG: Recent Labs  Lab 03/13/19 1617 03/13/19 2117 03/13/19 2303 03/14/19 0728 03/14/19 1150  GLUCAP 247* 258* 233* 216* 241*    Microbiology: Results for orders placed or performed during the hospital encounter of 03/02/19  SARS Coronavirus 2 (CEPHEID - Performed in [place] hospital lab), Hosp Order     Status: None   Collection Time: 03/02/19  6:35 AM  Result Value Ref Range Status   SARS Coronavirus 2 NEGATIVE NEGATIVE Final    Comment: (NOTE) If result is NEGATIVE SARS-CoV-2 target nucleic acids are NOT DETECTED. The SARS-CoV-2 RNA is generally detectable in upper and lower  respiratory specimens during the acute phase of infection. The lowest  concentration of SARS-CoV-2 viral copies this assay can detect is 250  copies / mL. A negative result does not preclude SARS-CoV-2  infection  and should not be used as the sole basis for treatment or other  patient management decisions.  A negative result may occur with  improper specimen collection / handling, submission of specimen other  than nasopharyngeal swab, presence of viral mutation(s) within the  areas targeted by this assay, and inadequate number of viral copies  (<250 copies / mL). A negative result must be combined with clinical  observations, patient history, and epidemiological information. If result is POSITIVE SARS-CoV-2 target nucleic acids are DETECTED. The SARS-CoV-2 RNA is generally detectable in upper and lower  respiratory specimens dur ing the acute phase of infection.  Positive  results are indicative of active infection with SARS-CoV-2.  Clinical   correlation with patient history and other diagnostic information is  necessary to determine patient infection status.  Positive results do  not rule out bacterial infection or co-infection with other viruses. If result is PRESUMPTIVE POSTIVE SARS-CoV-2 nucleic acids MAY BE PRESENT.   A presumptive positive result was obtained on the submitted specimen  and confirmed on repeat testing.  While 2019 novel coronavirus  (SARS-CoV-2) nucleic acids may be present in the submitted sample  additional confirmatory testing may be necessary for epidemiological  and / or clinical management purposes  to differentiate between  SARS-CoV-2 and other Sarbecovirus currently known to infect humans.  If clinically indicated additional testing with an alternate test  methodology [phone]) is advised. The SARS-CoV-2 RNA is generally  detectable in upper and lower respiratory sp ecimens during the acute  phase of infection. The expected result is Negative. Fact Sheet for Patients:  StrictlyIdeas.no Fact Sheet for Healthcare Providers: BankingDealers.co.za This test is not yet approved or cleared by the [place] FDA and has been authorized for detection and/or diagnosis of SARS-CoV-2 by FDA under an Emergency Use Authorization (EUA).  This EUA will remain in effect (meaning this test can be used) for the duration of the COVID-19 declaration under Section 564(b)(1) of the Act, 21 U.S.C. section 360bbb-3(b)(1), unless the authorization is terminated or revoked sooner. Performed at [facility], [address] [place], [place] [address]   MRSA PCR Screening     Status: None   Collection Time: 03/06/19  8:15 PM  Result Value Ref Range Status   MRSA by PCR NEGATIVE NEGATIVE Final    Comment:        The GeneXpert MRSA Assay (FDA approved for NASAL specimens only), is one component of a comprehensive MRSA colonization surveillance program. It  is not intended to diagnose MRSA infection nor to guide or monitor treatment for MRSA infections. Performed at [facility], [address] [place], [place] [address]   Novel Coronavirus, NAA (hospital order; send-out to ref lab)     Status: None   Collection Time: 03/08/19  6:34 PM  Result Value Ref Range Status   SARS-CoV-2, NAA NOT DETECTED NOT DETECTED Final    Comment: (NOTE) This test was developed and its performance characteristics determined by [employer]. This test has not been FDA cleared or approved. This test has been authorized by FDA under an Emergency Use Authorization (EUA). This test is only authorized for the duration of time the declaration that circumstances exist justifying the authorization of the emergency use of in vitro diagnostic tests for detection of SARS-CoV-2 virus and/or diagnosis of COVID-19 infection under section 564(b)(1) of the Act, 21 U.S.C. 932TFT-7(D)(2), unless the authorization is terminated or revoked sooner. When diagnostic testing is negative, the possibility of a false  negative result should be considered in the context of a patient's recent exposures and the presence of clinical signs and symptoms consistent with COVID-19. An individual without symptoms of COVID-19 and who is not shedding SARS-CoV-2 virus would expect to have a negative (not detected) result in this assay. Performed  At: [facility] [place], [place] [id number] Rush Farmer MD ME:[id number]    [place]  Final    Comment: Performed at [facility], [address] [place], [place] [address]    Coagulation Studies: No results for input(s): LABPROT, INR in the last 72 hours.  Urinalysis: No results for input(s): COLORURINE, LABSPEC, PHURINE, GLUCOSEU, HGBUR, BILIRUBINUR, KETONESUR, PROTEINUR, UROBILINOGEN, NITRITE, LEUKOCYTESUR in the last 72 hours.  Invalid input(s): APPERANCEUR     Imaging: No results found.   Medications:   . sodium chloride Stopped (03/11/19 1725)   . aspirin  81 mg Oral Daily  . atorvastatin  40 mg Oral q1800  . Chlorhexidine Gluconate Cloth  6 each Topical Q0600  . citalopram  10 mg Oral Daily  . docusate sodium  100 mg Oral BID  . epoetin (EPOGEN/PROCRIT) injection  10,000 Units Intravenous Q M,W,F-HD  . feeding supplement (ENSURE ENLIVE)  237 mL Oral BID BM  . fluticasone  2 spray Each Nare Daily  . heparin  5,000 Units Subcutaneous Q8H  . hydrALAZINE  25 mg Oral TID WC  . insulin aspart  0-5 Units Subcutaneous QHS  . insulin aspart  0-9 Units Subcutaneous TID WC  . insulin aspart  7 Units Subcutaneous TID WC  . insulin glargine  30 Units Subcutaneous Daily  . lamoTRIgine  25 mg Oral BID  . levETIRAcetam  250 mg Oral BID  . loratadine  10 mg Oral Daily  . Melatonin  5 mg Oral QHS  . metoprolol tartrate  25 mg Oral BID  . multivitamin  1 tablet Oral QHS  . nystatin   Topical BID  . OLANZapine  10 mg Oral QHS  . pantoprazole  40 mg Oral BID  . polyethylene glycol  17 g Oral Daily  . senna-docusate  1 tablet Oral QHS  . sodium chloride flush  3 mL Intravenous Q12H  . vitamin B-12  1,000 mcg Oral Daily   sodium chloride, acetaminophen, albuterol, alum & mag hydroxide-simeth, clonazePAM, docusate sodium, ondansetron, oxyCODONE-acetaminophen, promethazine  Assessment/ Plan:  [name] is a 78 y.o. white female with congestive heart failure, hypertension, diabetes mellitus type II, obstructive sleep apnea, nephrolithiasis   1. Acute renal failure on chronic kidney disease stage IV: baseline creatinine of 2.69, GFR of 17. First dialysis was 5/22.  Chronic kidney disease secondary to diabetic nephropathy Acute renal failure secondary to acute cardiorenal syndrome.  No indication for dialysis today.  Evaluate daily for dialysis need.   2. Anemia of chronic kidney disease: hemoglobin 8.3. EPO with HD treatment .   3.  Secondary Hyperparathyroidism: PTH low at 51. Calcium and phosphorus at goal.   4. 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 With proteinuria. History of poor control. Hemoglobin A1c of 7.6% on 04/22/2018  5. Hypertension with diastolic dysfunction congestive heart failure, pulmonary hypertension. Furosemide gtt with limited success, therefore transitioned to intermittent hemodialysis treatments.  Volume status has improved significantly.  Currently on hydralazine.    LOS: [place] 5/27/20203:56 PM

## 2019-03-14 NOTE — Progress Notes (Signed)
Patients blood pressure 102/60 with scheduled hydralazine. Per Dr. Manuella Ghazi ok to hold medication. Will hold and continue to monitor patient.

## 2019-03-15 ENCOUNTER — Other Ambulatory Visit (INDEPENDENT_AMBULATORY_CARE_PROVIDER_SITE_OTHER): Payer: Self-pay | Admitting: Vascular Surgery

## 2019-03-15 LAB — BASIC METABOLIC PANEL WITH GFR
Anion gap: 9 (ref 5–15)
BUN: 47 mg/dL — ABNORMAL HIGH (ref 8–23)
CO2: 30 mmol/L (ref 22–32)
Calcium: 9.3 mg/dL (ref 8.9–10.3)
Chloride: 95 mmol/L — ABNORMAL LOW (ref 98–111)
Creatinine, Ser: 2.99 mg/dL — ABNORMAL HIGH (ref 0.44–1.00)
GFR calc Af Amer: 17 mL/min — ABNORMAL LOW
GFR calc non Af Amer: 15 mL/min — ABNORMAL LOW
Glucose, Bld: 225 mg/dL — ABNORMAL HIGH (ref 70–99)
Potassium: 4.8 mmol/L (ref 3.5–5.1)
Sodium: 134 mmol/L — ABNORMAL LOW (ref 135–145)

## 2019-03-15 LAB — GLUCOSE, CAPILLARY
Glucose-Capillary: 197 mg/dL — ABNORMAL HIGH (ref 70–99)
Glucose-Capillary: 297 mg/dL — ABNORMAL HIGH (ref 70–99)
Glucose-Capillary: 258 mg/dL — ABNORMAL HIGH (ref 70–99)
Glucose-Capillary: 187 mg/dL — ABNORMAL HIGH (ref 70–99)

## 2019-03-15 LAB — MAGNESIUM: Magnesium: 2.6 mg/dL — ABNORMAL HIGH (ref 1.7–2.4)

## 2019-03-15 MED ORDER — METHYLPREDNISOLONE SODIUM SUCC 125 MG IJ SOLR: 125.0000 mg | Freq: Once | INTRAMUSCULAR | Status: DC | PRN

## 2019-03-15 MED ORDER — FAMOTIDINE 20 MG PO TABS: 40.0000 mg | ORAL_TABLET | Freq: Once | ORAL | Status: DC | PRN

## 2019-03-15 MED ORDER — SODIUM CHLORIDE 0.9 % IV SOLN: INTRAVENOUS | Status: DC

## 2019-03-15 MED ORDER — HYDROMORPHONE HCL 1 MG/ML IJ SOLN: 1.0000 mg | Freq: Once | INTRAMUSCULAR | Status: DC | PRN

## 2019-03-15 MED ORDER — ONDANSETRON HCL 4 MG/2ML IJ SOLN
4.0000 mg | Freq: Four times a day (QID) | INTRAMUSCULAR | Status: DC | PRN
  Administered 2019-03-17: 4 mg via INTRAVENOUS
  Filled 2019-03-15: qty 2

## 2019-03-15 MED ORDER — CLINDAMYCIN PHOSPHATE 300 MG/50ML IV SOLN
300.0000 mg | Freq: Once | INTRAVENOUS | Status: AC
  Administered 2019-03-16: 14:00:00 300 mg via INTRAVENOUS
  Filled 2019-03-15: qty 50

## 2019-03-15 MED ORDER — DIPHENHYDRAMINE HCL 50 MG/ML IJ SOLN: 50.0000 mg | Freq: Once | INTRAMUSCULAR | Status: DC | PRN

## 2019-03-15 MED ORDER — MIDAZOLAM HCL 2 MG/ML PO SYRP: 8.0000 mg | ORAL_SOLUTION | Freq: Once | ORAL | Status: DC | PRN

## 2019-03-15 NOTE — Plan of Care (Signed)
°  Problem: Education: °Goal: Knowledge of General Education information will improve °Description: Including pain rating scale, medication(s)/side effects and non-pharmacologic comfort measures °Outcome: Progressing °  °Problem: Clinical Measurements: °Goal: Will remain free from infection °Outcome: Progressing °Goal: Diagnostic test results will improve °Outcome: Progressing °Goal: Respiratory complications will improve °Outcome: Progressing °Goal: Cardiovascular complication will be avoided °Outcome: Progressing °  °

## 2019-03-15 NOTE — Progress Notes (Signed)
[facility]  ROUNDING NOTE   Subjective:   Sleepy this morning.  UOP 1454mL  Creatinine 2.99 (2.42)  Objective:  Vital signs in last 24 hours:  Temp:  [97.5 F (36.4 C)-98.5 F (36.9 C)] 97.5 F (36.4 C) (05/28 0732) Pulse Rate:  [54-68] 54 (05/28 0732) Resp:  [16-20] 19 (05/28 0732) BP: (102-148)/(40-60) 131/40 (05/28 0732) SpO2:  [98 %-100 %] 100 % (05/28 0732) Weight:  [878 kg] 114 kg (05/28 0426)  Weight change: -1.766 kg Filed Weights   03/13/19 1300 03/14/19 0443 03/15/19 0426  Weight: 113.8 kg 113.6 kg 114 kg    Intake/Output: I/O last 3 completed shifts: In: 3 [I.V.:3] Out: 1500 [Urine:1500]   Intake/Output this shift:  No intake/output data recorded.  Physical Exam: General: NAD, laying in bed  Head: Normocephalic, atraumatic. Moist oral mucosal membranes  Eyes: Anicteric, PERRL  Neck: Supple, trachea midline  Lungs:  Clear to auscultation, 3 L [place]  Heart: Regular rate and rhythm  Abdomen:  Soft, nontender, obese  Extremities:  1+ peripheral edema.  Neurologic: Nonfocal, moving all four extremities  Skin: No lesions  Access: Left femoral temp HD catheter Dr. Lucky Cowboy 6/76    Basic Metabolic Panel: Recent Labs  Lab 03/09/19 0419 03/10/19 7209 03/10/19 1708 03/12/19 1109 03/13/19 0327 03/14/19 0331 03/15/19 0341  NA 136 139  --  135 134* 134* 134*  K 5.2* 4.8  --  4.8 4.8 4.8 4.8  CL 92* 97*  --  95* 95* 93* 95*  CO2 36* 35*  --  33* 32 32 30  GLUCOSE 224* 234*  --  201* 187* 244* 225*  BUN 91* 67*  --  59* 36* 29* 47*  CREATININE 4.23* 2.99*  --  3.00* 2.33* 2.42* 2.99*  CALCIUM 9.0 9.1  --  9.5 8.9 9.2 9.3  MG  --   --   --   --   --  2.5* 2.6*  PHOS 4.5 3.8 4.1 3.8 3.0  --   --     Liver Function Tests: Recent Labs  Lab 03/10/19 0611 03/12/19 1109 03/13/19 0327  ALBUMIN 3.1* 3.1* 3.2*   No results for input(s): LIPASE, AMYLASE in the last 168 hours. No results for input(s): AMMONIA in the last 168  hours.  CBC: Recent Labs  Lab 03/12/19 0727 03/13/19 0327 03/14/19 0331  WBC 7.6 8.9 8.4  NEUTROABS  --   --  4.9  HGB 7.8* 7.9* 8.3*  HCT 25.4* 25.2* 26.7*  MCV 94.1 92.6 94.0  PLT 221 225 237    Cardiac Enzymes: No results for input(s): CKTOTAL, CKMB, CKMBINDEX, TROPONINI in the last 168 hours.  BNP: Invalid input(s): POCBNP  CBG: Recent Labs  Lab 03/14/19 0728 03/14/19 1150 03/14/19 1642 03/14/19 2114 03/15/19 0734  GLUCAP 216* 241* 289* 182* 197*    Microbiology: Results for orders placed or performed during the hospital encounter of 03/02/19  SARS Coronavirus 2 (CEPHEID - Performed in [place] hospital lab), Hosp Order     Status: None   Collection Time: 03/02/19  6:35 AM  Result Value Ref Range Status   SARS Coronavirus 2 NEGATIVE NEGATIVE Final    Comment: (NOTE) If result is NEGATIVE SARS-CoV-2 target nucleic acids are NOT DETECTED. The SARS-CoV-2 RNA is generally detectable in upper and lower  respiratory specimens during the acute phase of infection. The lowest  concentration of SARS-CoV-2 viral copies this assay can detect is 250  copies / mL. A negative result does not preclude  SARS-CoV-2 infection  and should not be used as the sole basis for treatment or other  patient management decisions.  A negative result may occur with  improper specimen collection / handling, submission of specimen other  than nasopharyngeal swab, presence of viral mutation(s) within the  areas targeted by this assay, and inadequate number of viral copies  (<250 copies / mL). A negative result must be combined with clinical  observations, patient history, and epidemiological information. If result is POSITIVE SARS-CoV-2 target nucleic acids are DETECTED. The SARS-CoV-2 RNA is generally detectable in upper and lower  respiratory specimens dur ing the acute phase of infection.  Positive  results are indicative of active infection with SARS-CoV-2.  Clinical  correlation  with patient history and other diagnostic information is  necessary to determine patient infection status.  Positive results do  not rule out bacterial infection or co-infection with other viruses. If result is PRESUMPTIVE POSTIVE SARS-CoV-2 nucleic acids MAY BE PRESENT.   A presumptive positive result was obtained on the submitted specimen  and confirmed on repeat testing.  While 2019 novel coronavirus  (SARS-CoV-2) nucleic acids may be present in the submitted sample  additional confirmatory testing may be necessary for epidemiological  and / or clinical management purposes  to differentiate between  SARS-CoV-2 and other Sarbecovirus currently known to infect humans.  If clinically indicated additional testing with an alternate test  methodology [phone]) is advised. The SARS-CoV-2 RNA is generally  detectable in upper and lower respiratory sp ecimens during the acute  phase of infection. The expected result is Negative. Fact Sheet for Patients:  StrictlyIdeas.no Fact Sheet for Healthcare Providers: BankingDealers.co.za This test is not yet approved or cleared by the [place] FDA and has been authorized for detection and/or diagnosis of SARS-CoV-2 by FDA under an Emergency Use Authorization (EUA).  This EUA will remain in effect (meaning this test can be used) for the duration of the COVID-19 declaration under Section 564(b)(1) of the Act, 21 U.S.C. section 360bbb-3(b)(1), unless the authorization is terminated or revoked sooner. Performed at [facility], [address] [place], [place] [address]   MRSA PCR Screening     Status: None   Collection Time: 03/06/19  8:15 PM  Result Value Ref Range Status   MRSA by PCR NEGATIVE NEGATIVE Final    Comment:        The GeneXpert MRSA Assay (FDA approved for NASAL specimens only), is one component of a comprehensive MRSA colonization surveillance program. It is  not intended to diagnose MRSA infection nor to guide or monitor treatment for MRSA infections. Performed at [facility], [address] [place], [place] [address]   Novel Coronavirus, NAA (hospital order; send-out to ref lab)     Status: None   Collection Time: 03/08/19  6:34 PM  Result Value Ref Range Status   SARS-CoV-2, NAA NOT DETECTED NOT DETECTED Final    Comment: (NOTE) This test was developed and its performance characteristics determined by [employer]. This test has not been FDA cleared or approved. This test has been authorized by FDA under an Emergency Use Authorization (EUA). This test is only authorized for the duration of time the declaration that circumstances exist justifying the authorization of the emergency use of in vitro diagnostic tests for detection of SARS-CoV-2 virus and/or diagnosis of COVID-19 infection under section 564(b)(1) of the Act, 21 U.S.C. 478GNF-6(O)(1), unless the authorization is terminated or revoked sooner. When diagnostic testing is negative, the possibility of a  false negative result should be considered in the context of a patient's recent exposures and the presence of clinical signs and symptoms consistent with COVID-19. An individual without symptoms of COVID-19 and who is not shedding SARS-CoV-2 virus would expect to have a negative (not detected) result in this assay. Performed  At: [facility] [place], [place] [id number] Rush Farmer MD YN:[id number]    [place]  Final    Comment: Performed at [facility], [address] [place], [place] [address]    Coagulation Studies: No results for input(s): LABPROT, INR in the last 72 hours.  Urinalysis: No results for input(s): COLORURINE, LABSPEC, PHURINE, GLUCOSEU, HGBUR, BILIRUBINUR, KETONESUR, PROTEINUR, UROBILINOGEN, NITRITE, LEUKOCYTESUR in the last 72 hours.  Invalid input(s): APPERANCEUR     Imaging: No results found.   Medications:   . sodium chloride Stopped (03/11/19 1725)   . aspirin  81 mg Oral Daily  . atorvastatin  40 mg Oral q1800  . Chlorhexidine Gluconate Cloth  6 each Topical Q0600  . citalopram  10 mg Oral Daily  . docusate sodium  100 mg Oral BID  . epoetin (EPOGEN/PROCRIT) injection  10,000 Units Intravenous Q M,W,F-HD  . feeding supplement (ENSURE ENLIVE)  237 mL Oral BID BM  . fluticasone  2 spray Each Nare Daily  . heparin  5,000 Units Subcutaneous Q8H  . hydrALAZINE  25 mg Oral TID WC  . insulin aspart  0-5 Units Subcutaneous QHS  . insulin aspart  0-9 Units Subcutaneous TID WC  . insulin aspart  7 Units Subcutaneous TID WC  . insulin glargine  30 Units Subcutaneous Daily  . lamoTRIgine  25 mg Oral BID  . levETIRAcetam  250 mg Oral BID  . loratadine  10 mg Oral Daily  . Melatonin  5 mg Oral QHS  . metoprolol tartrate  25 mg Oral BID  . multivitamin  1 tablet Oral QHS  . nystatin   Topical BID  . OLANZapine  10 mg Oral QHS  . pantoprazole  40 mg Oral BID  . polyethylene glycol  17 g Oral Daily  . senna-docusate  1 tablet Oral QHS  . sodium chloride flush  3 mL Intravenous Q12H  . vitamin B-12  1,000 mcg Oral Daily   sodium chloride, acetaminophen, albuterol, alum & mag hydroxide-simeth, clonazePAM, docusate sodium, ondansetron, oxyCODONE-acetaminophen, promethazine  Assessment/ Plan:  Carolyn Valdez is a 78 y.o. white female with congestive heart failure, hypertension, diabetes mellitus type II, obstructive sleep apnea, nephrolithiasis   1. Acute renal failure on chronic kidney disease stage IV: baseline creatinine of 2.69, GFR of 17 on 09/28/18. First dialysis was 5/22.  Chronic kidney disease secondary to diabetic nephropathy Acute renal failure secondary to acute cardiorenal syndrome.  No indication for dialysis today.  Evaluate daily for dialysis need. If no indication for dialysis tomorrow, will remove temp catheter. However if  needs dialysis tomorrow, will schedule tunneled catheter placement.   2. Anemia of chronic kidney disease: hemoglobin 8.3. EPO with HD treatment .   3. Secondary Hyperparathyroidism: PTH low at 51. Calcium and phosphorus at goal.   4. 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 With proteinuria. History of poor control. Hemoglobin A1c of 7.6% on 04/22/2018  5. Hypertension with diastolic dysfunction congestive heart failure, pulmonary hypertension. Furosemide gtt with limited success, therefore transitioned to intermittent hemodialysis treatments.  Volume status has improved significantly.  Currently on hydralazine.    LOS: [place]  5/28/202010:26 AM

## 2019-03-15 NOTE — Progress Notes (Signed)
Inpatient Diabetes Program Recommendations  AACE/ADA: New Consensus Statement on Inpatient Glycemic Control (2015)  Target Ranges:  Prepandial:   less than 140 mg/dL      Peak postprandial:   less than 180 mg/dL (1-2 hours)      Critically ill patients:  140 - 180 mg/dL   Results for OTTIS, SARNOWSKI (MRN [id number]) as of 03/15/2019 12:55  Ref. Range 03/14/2019 07:28 03/14/2019 11:50 03/14/2019 16:42 03/14/2019 21:14  Glucose-Capillary Latest Ref Range: 70 - 99 mg/dL 216 (H)  10 units NOVOLOG  241 (H)  10 units NOVOLOG  289 (H)  12 units NOVOLOG  182 (H)    30 units LANTUS   Results for MERIS, REEDE (MRN [id number]) as of 03/15/2019 12:55  Ref. Range 03/15/2019 07:34 03/15/2019 12:11  Glucose-Capillary Latest Ref Range: 70 - 99 mg/dL 197 (H)  9 units NOVOLOG  297 (H)     Home DM Meds: Lantus 55 units QHS       Novolog 5 units if CBG >200 mg/dl       Glipizide 5 mg daily  Current Orders: Lantus 30 units QHS     Novolog Sensitive Correction Scale/ SSI (0-9 units) TID AC + HS     Novolog 7 units TID with meals      Lantus increased last PM (increased by 6 units).   CBG still a bit elevated this AM and 12pm CBG today also quite elevated.    MD- Please consider the following in-hospital insulin adjustments:  1. Increase Lantus further to 35 units QHS  2. Increase Novolog Meal Coverage to: Novolog 10 units TID with meals    --Will follow patient during hospitalization--  Wyn Quaker RN, MSN, CDE Diabetes Coordinator Inpatient Glycemic Control Team Team Pager: [phone] (8a-5p)

## 2019-03-15 NOTE — Care Management Important Message (Signed)
Important Message  Patient Details  Name: Carolyn Valdez MRN: [id number] Date of Birth: 1941-08-13   Medicare Important Message Given:  Yes    Dannette Barbara 03/15/2019, 12:35 PM

## 2019-03-15 NOTE — Progress Notes (Signed)
Attempted to call patient sister, [name] for update upon patients request, no answer. Will report to night shift to see if they can contact patient sister.

## 2019-03-15 NOTE — TOC Progression Note (Signed)
Transition of Care [facility]) - Progression Note    Patient Details  Name: Carolyn Valdez MRN: [id number] Date of Birth: 05/03/1941  Transition of Care [facility]) CM/SW Contact  Elza Rafter, RN Phone Number: 03/15/2019, 2:08 PM  Clinical Narrative:   Tiajuana Amass to DC-Nephrology following and assessing need for HD.  HD on hold yesterday and today.  Updated Lattie Haw with [name].      Expected Discharge Plan: Assisted Living Barriers to Discharge: Continued Medical Work up  Expected Discharge Plan and Services Expected Discharge Plan: Assisted Living   Discharge Planning Services: CM Consult Post Acute Care Choice: Durable Medical Equipment Living arrangements for the past 2 months: Assisted Living Facility                           HH Arranged: RN, PT, OT [facility] Agency: [facility] (now [facility]) Date [place]: 03/05/19 Time [place]: [place] Representative spoke with at [place]: [place] ([place]) Interventions    Readmission Risk Interventions No flowsheet data found.

## 2019-03-15 NOTE — Progress Notes (Addendum)
[place] at [place] NAME: Carolyn Valdez    MR#:  [id number]  DATE OF BIRTH:  01-14-1941  SUBJECTIVE:   Chief Complaint  Patient presents with  . Shortness of Breath   Patient is seen at the bedside. Patient is found sitting up in bed eating breakfast. She feels that her breathing is somewhat improved.  REVIEW OF SYSTEMS:  Review of Systems  Constitutional: Negative for chills, fever, malaise/fatigue and weight loss.  HENT: Negative for congestion, hearing loss and sore throat.   Eyes: Negative for blurred vision and double vision.  Respiratory: Positive for shortness of breath and wheezing. Negative for cough.   Cardiovascular: Positive for orthopnea and leg swelling. Negative for chest pain and palpitations.  Gastrointestinal: Positive for constipation. Negative for abdominal pain, diarrhea, nausea and vomiting.  Genitourinary: Negative for dysuria and urgency.  Musculoskeletal: Positive for back pain. Negative for myalgias.  Skin: Negative for rash.  Neurological: Negative for dizziness, sensory change, speech change, focal weakness and headaches.  Psychiatric/Behavioral: Positive for depression. The patient is nervous/anxious.    DRUG ALLERGIES:   Allergies  Allergen Reactions  . Sulfa Antibiotics Hives  . Biaxin [Clarithromycin] Hives  . Influenza A (H1n1) Monoval Vac Other (See Comments)    Pt states that she was told by her MD not to get the influenza vaccine.    . Morphine Other (See Comments)    Reaction:  Dizziness and confusion   . Pyridium [Phenazopyridine Hcl] Other (See Comments)    Reaction:  Unknown   . Ceftriaxone Anxiety  . Latex Rash  . Prednisone Rash  . Tape Rash   VITALS:  Blood pressure (!) 131/40, pulse 64, temperature (!) 97.5 F (36.4 C), temperature source Oral, resp. rate 19, height 5\' 7"  (1.702 m), weight 114 kg, SpO2 100 %. PHYSICAL EXAMINATION:   GENERAL:  78 y.o.-year-old patient lying in the  bed with no acute distress.  EYES: Pupils equal, round, reactive to light and accommodation. No scleral icterus. Extraocular muscles intact.  HEENT: Head atraumatic, normocephalic. Oropharynx and nasopharynx clear.  NECK:  Supple, no jugular venous distention. No thyroid enlargement, no tenderness.  LUNGS: Normal breath sounds bilaterally, no wheezing, rales,rhonchi or crepitation. No use of accessory muscles of respiration.  CARDIOVASCULAR: S1, S2 normal. Systolic murmur, rubs, or gallops.  ABDOMEN: Soft, nontender, nondistended. Bowel sounds present. No organomegaly or mass.  EXTREMITIES: Bilateral pitting edema of peripheral extremities, cyanosis, or clubbing.  NEUROLOGIC: Cranial nerves II through XII are intact. Muscle strength 5/5 in all extremities. Sensation intact. Gait not checked.  PSYCHIATRIC: The patient is alert and oriented x 3.  SKIN: No obvious rash, lesion, or ulcer.   DATA REVIEWED:  LABORATORY PANEL:  Female CBC Recent Labs  Lab 03/14/19 0331  WBC 8.4  HGB 8.3*  HCT 26.7*  PLT 237   ------------------------------------------------------------------------------------------------------------------ Chemistries  Recent Labs  Lab 03/15/19 0341  NA 134*  K 4.8  CL 95*  CO2 30  GLUCOSE 225*  BUN 47*  CREATININE 2.99*  CALCIUM 9.3  MG 2.6*   RADIOLOGY:  No results found. ASSESSMENT AND PLAN:   78 y.o. female with a history of diastolic congestive heart failure, chronic kidney disease, breast cancer, GI bleed, hypertension, hyperlipidemia, anxiety and depression, anemia, and diabetes mellitus presenting with worsening shortness of mass of breath.  1. Acute Congestive Heart Failure = Likely due to volume overload Last Echo 03/02/19 EF 50-55% - Beta-Blockade: Toprol - Improving with hemodialysis,  holding HD today again per Nephrology - Low salt diet  - PT eval  2. Acute on Chronic Kidney disease - post hemodialysis 5/26, holding HD today and re-evaluate  needs. Per Nephrology will plan for tunneled catheter placement if continues to require HD - Continue to monitor renal function - Nephrology to continue to follow  3. 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  - Hemoglobin A1c of 7.6% on 04/22/2018 - SSI - Holding metformin due to renal function  4. Hypertension -stable with a hemodialysis -Toprol as above - Hydralazine as needed  5. Anemia of CKD:  Hgb 8.3.  Receiving Epogen with each treatment.  Iron saturation 15%, ferritin 127, 10/23/2018.   6.  Hyperkalemia -stable - Continue to monitor and replace as needed  7.  Hyperlipidemia -continue atorvastatin  8.  Anxiety and depression -continue Zyprexa and Celexa -Also on Lamictal likely for mood stabilization -On Keppra unclear rationale, no history of seizure?    All the records are reviewed and case discussed with Care Management/Social Worker. Management plans discussed with the patient, family and they are in agreement.  CODE STATUS: Full Code  TOTAL TIME TAKING CARE OF THIS PATIENT: 37 minutes.   More than 50% of the time was spent in counseling/coordination of care: YES  POSSIBLE D/C IN 1-2 DAYS, DEPENDING ON CLINICAL CONDITION.   on 03/15/2019 at 12:33 PM  This patient was staffed with Dr. Vianne Bulls, Lise Auer who personally evaluated patient, reviewed documentation and agreed with assessment and plan of care as above.  Rufina Falco, DNP, FNP-BC [facility] Nurse Practitioner   Between 7am to 6pm - Pager [phone]  After 6pm go to [email or web address] - Proofreader  [facility] [place] [facility]  Office  [phone]  CC: Primary care physician; [facility], [facility]  Note: This dictation was prepared with Dragon dictation along with smaller phrase technology. Any transcriptional errors that result from this process are unintentional.

## 2019-03-16 ENCOUNTER — Telehealth: Payer: Medicare Other | Admitting: Family

## 2019-03-16 ENCOUNTER — Inpatient Hospital Stay
Admission: EM | Disposition: A | Discharge status: Home Health | Payer: Self-pay | Source: Skilled Nursing Facility | Attending: Internal Medicine

## 2019-03-16 DIAGNOSIS — N186 End stage renal disease: Secondary | ICD-10-CM

## 2019-03-16 HISTORY — PX: DIALYSIS/PERMA CATHETER INSERTION: CATH118288

## 2019-03-16 LAB — BASIC METABOLIC PANEL
Anion gap: 10 (ref 5–15)
BUN: 55 mg/dL — ABNORMAL HIGH (ref 8–23)
CO2: 31 mmol/L (ref 22–32)
Calcium: 9.7 mg/dL (ref 8.9–10.3)
Chloride: 93 mmol/L — ABNORMAL LOW (ref 98–111)
Creatinine, Ser: 2.89 mg/dL — ABNORMAL HIGH (ref 0.44–1.00)
GFR calc Af Amer: 18 mL/min — ABNORMAL LOW (ref >60)
GFR calc non Af Amer: 15 mL/min — ABNORMAL LOW (ref >60)
Glucose, Bld: 204 mg/dL — ABNORMAL HIGH (ref 70–99)
Potassium: 5.4 mmol/L — ABNORMAL HIGH (ref 3.5–5.1)
Sodium: 134 mmol/L — ABNORMAL LOW (ref 135–145)

## 2019-03-16 LAB — CBC
HCT: 25.6 % — ABNORMAL LOW (ref 36.0–46.0)
Hemoglobin: 8 g/dL — ABNORMAL LOW (ref 12.0–15.0)
MCH: 29.2 pg (ref 26.0–34.0)
MCHC: 31.3 g/dL (ref 30.0–36.0)
MCV: 93.4 fL (ref 80.0–100.0)
Platelets: 287 10*3/uL (ref 150–400)
RBC: 2.74 MIL/uL — ABNORMAL LOW (ref 3.87–5.11)
RDW: 15.8 % — ABNORMAL HIGH (ref 11.5–15.5)
WBC: 10.1 10*3/uL (ref 4.0–10.5)
nRBC: 0 % (ref 0.0–0.2)

## 2019-03-16 LAB — GLUCOSE, CAPILLARY
Glucose-Capillary: 120 mg/dL — ABNORMAL HIGH (ref 70–99)
Glucose-Capillary: 181 mg/dL — ABNORMAL HIGH (ref 70–99)
Glucose-Capillary: 184 mg/dL — ABNORMAL HIGH (ref 70–99)
Glucose-Capillary: 211 mg/dL — ABNORMAL HIGH (ref 70–99)

## 2019-03-16 SURGERY — DIALYSIS/PERMA CATHETER INSERTION
Anesthesia: Choice

## 2019-03-16 MED ORDER — FENTANYL CITRATE (PF) 100 MCG/2ML IJ SOLN
INTRAMUSCULAR | Status: AC
  Filled 2019-03-16: qty 2

## 2019-03-16 MED ORDER — LIDOCAINE-EPINEPHRINE (PF) 1 %-1:200000 IJ SOLN
INTRAMUSCULAR | Status: AC
  Filled 2019-03-16: qty 30

## 2019-03-16 MED ORDER — INSULIN ASPART 100 UNIT/ML ~~LOC~~ SOLN
10.0000 [IU] | Freq: Three times a day (TID) | SUBCUTANEOUS | Status: DC
  Administered 2019-03-17 – 2019-03-19 (×6): 10 [IU] via SUBCUTANEOUS
  Filled 2019-03-16 (×6): qty 1

## 2019-03-16 MED ORDER — INSULIN GLARGINE 100 UNIT/ML ~~LOC~~ SOLN
35.0000 [IU] | Freq: Every day | SUBCUTANEOUS | Status: DC
  Administered 2019-03-17 – 2019-03-18 (×2): 35 [IU] via SUBCUTANEOUS
  Filled 2019-03-16 (×4): qty 0.35

## 2019-03-16 MED ORDER — FENTANYL CITRATE (PF) 100 MCG/2ML IJ SOLN
INTRAMUSCULAR | Status: DC | PRN
  Administered 2019-03-16: 50 ug via INTRAVENOUS

## 2019-03-16 MED ORDER — MIDAZOLAM HCL 2 MG/2ML IJ SOLN
INTRAMUSCULAR | Status: AC
  Filled 2019-03-16: qty 4

## 2019-03-16 MED ORDER — MIDAZOLAM HCL 2 MG/2ML IJ SOLN
INTRAMUSCULAR | Status: DC | PRN
  Administered 2019-03-16: 2 mg via INTRAVENOUS

## 2019-03-16 MED ORDER — CLINDAMYCIN PHOSPHATE 300 MG/50ML IV SOLN
INTRAVENOUS | Status: AC
  Administered 2019-03-16: 14:00:00 300 mg via INTRAVENOUS
  Filled 2019-03-16: qty 50

## 2019-03-16 MED ORDER — HEPARIN SODIUM (PORCINE) 10000 UNIT/ML IJ SOLN
INTRAMUSCULAR | Status: AC
  Filled 2019-03-16: qty 1

## 2019-03-16 SURGICAL SUPPLY — 13 items
BIOPATCH RED 1 DISK 7.0 (GAUZE/BANDAGES/DRESSINGS) ×2 IMPLANT
BIOPATCH RED 1IN DISK 7.0MM (GAUZE/BANDAGES/DRESSINGS) ×1
CATH CANNON HEMO 15FR 19 (HEMODIALYSIS SUPPLIES) ×3 IMPLANT
COVER PROBE U/S 5X48 (MISCELLANEOUS) ×3 IMPLANT
DERMABOND ADVANCED (GAUZE/BANDAGES/DRESSINGS) ×2
DERMABOND ADVANCED .7 DNX12 (GAUZE/BANDAGES/DRESSINGS) ×1 IMPLANT
PACK ANGIOGRAPHY (CUSTOM PROCEDURE TRAY) ×3 IMPLANT
SUT MNCRL 4-0 (SUTURE) ×2
SUT MNCRL 4-0 27XMFL (SUTURE) ×1
SUT PROLENE 0 CT 1 30 (SUTURE) ×3 IMPLANT
SUT VICRYL+ 3-0 36IN CT-1 (SUTURE) ×3 IMPLANT
SUTURE MNCRL 4-0 27XMF (SUTURE) ×1 IMPLANT
TOWEL OR 17X26 4PK STRL BLUE (TOWEL DISPOSABLE) ×3 IMPLANT

## 2019-03-16 NOTE — Progress Notes (Signed)
HD Tx End  1.5 liter removal, tolerated well   03/16/19 2245  Vital Signs  Pulse Rate 67  Pulse Rate Source Monitor  Resp (!) 23  BP (!) 135/52  BP Location Left Arm  BP Method Automatic  Patient Position (if appropriate) Lying  Oxygen Therapy  SpO2 97 %  O2 Device Nasal Cannula  O2 Flow Rate (L/min) 3 L/min  Pain Assessment  Pain Scale 0-10  Pain Score 4  Pain Type Surgical pain  Pain Intervention(s) Medication (See eMAR) (tylenol given 10pm, reposition)  During Hemodialysis Assessment  Blood Flow Rate (mL/min) 200 mL/min  Arterial Pressure (mmHg) -90 mmHg  Venous Pressure (mmHg) 100 mmHg  Transmembrane Pressure (mmHg) 50 mmHg  Ultrafiltration Rate (mL/min) 70 mL/min  Dialysate Flow Rate (mL/min) 600 ml/min  Conductivity: Machine  14  HD Safety Checks Performed Yes  Dialysis Fluid Bolus Normal Saline  Bolus Amount (mL) 250 mL  Intra-Hemodialysis Comments Tolerated well;Tx completed

## 2019-03-16 NOTE — Progress Notes (Signed)
[facility]  ROUNDING NOTE   Subjective:   UOP 1 liter Continues to complain of shortness of breath and edema  Objective:  Vital signs in last 24 hours:  Temp:  [98 F (36.7 C)-98.4 F (36.9 C)] 98.4 F (36.9 C) (05/29 0740) Pulse Rate:  [60-62] 62 (05/29 0740) Resp:  [18-19] 18 (05/29 0740) BP: (131-146)/(44-54) 139/54 (05/29 0740) SpO2:  [96 %-98 %] 97 % (05/29 0740) Weight:  [115.2 kg] 115.2 kg (05/29 0623)  Weight change: 1.166 kg Filed Weights   03/14/19 0443 03/15/19 0426 03/16/19 0623  Weight: 113.6 kg 114 kg 115.2 kg    Intake/Output: I/O last 3 completed shifts: In: -  Out: 1650 [Urine:1650]   Intake/Output this shift:  Total I/O In: -  Out: 1450 [Urine:1450]  Physical Exam: General: NAD, laying in bed  Head: Normocephalic, atraumatic. Moist oral mucosal membranes  Eyes: Anicteric, PERRL  Neck: Supple, trachea midline  Lungs:  Clear to auscultation, 3 L [place]  Heart: Regular rate and rhythm  Abdomen:  Soft, nontender, obese  Extremities:  1+ peripheral edema.  Neurologic: Nonfocal, moving all four extremities  Skin: No lesions  Access: Left femoral temp HD catheter Dr. Lucky Cowboy 0/94    Basic Metabolic Panel: Recent Labs  Lab 03/10/19 7096 03/10/19 1708 03/12/19 1109 03/13/19 0327 03/14/19 0331 03/15/19 0341 03/16/19 0329  NA 139  --  135 134* 134* 134* 134*  K 4.8  --  4.8 4.8 4.8 4.8 5.4*  CL 97*  --  95* 95* 93* 95* 93*  CO2 35*  --  33* 32 32 30 31  GLUCOSE 234*  --  201* 187* 244* 225* 204*  BUN 67*  --  59* 36* 29* 47* 55*  CREATININE 2.99*  --  3.00* 2.33* 2.42* 2.99* 2.89*  CALCIUM 9.1  --  9.5 8.9 9.2 9.3 9.7  MG  --   --   --   --  2.5* 2.6*  --   PHOS 3.8 4.1 3.8 3.0  --   --   --     Liver Function Tests: Recent Labs  Lab 03/10/19 0611 03/12/19 1109 03/13/19 0327  ALBUMIN 3.1* 3.1* 3.2*   No results for input(s): LIPASE, AMYLASE in the last 168 hours. No results for input(s): AMMONIA in the last 168  hours.  CBC: Recent Labs  Lab 03/12/19 0727 03/13/19 0327 03/14/19 0331 03/16/19 0329  WBC 7.6 8.9 8.4 10.1  NEUTROABS  --   --  4.9  --   HGB 7.8* 7.9* 8.3* 8.0*  HCT 25.4* 25.2* 26.7* 25.6*  MCV 94.1 92.6 94.0 93.4  PLT 221 225 237 287    Cardiac Enzymes: No results for input(s): CKTOTAL, CKMB, CKMBINDEX, TROPONINI in the last 168 hours.  BNP: Invalid input(s): POCBNP  CBG: Recent Labs  Lab 03/15/19 1211 03/15/19 1722 03/15/19 2054 03/16/19 0738 03/16/19 1131  GLUCAP 297* 258* 187* 211* 181*    Microbiology: Results for orders placed or performed during the hospital encounter of 03/02/19  SARS Coronavirus 2 (CEPHEID - Performed in [place] hospital lab), Hosp Order     Status: None   Collection Time: 03/02/19  6:35 AM  Result Value Ref Range Status   SARS Coronavirus 2 NEGATIVE NEGATIVE Final    Comment: (NOTE) If result is NEGATIVE SARS-CoV-2 target nucleic acids are NOT DETECTED. The SARS-CoV-2 RNA is generally detectable in upper and lower  respiratory specimens during the acute phase of infection. The lowest  concentration of SARS-CoV-2  viral copies this assay can detect is 250  copies / mL. A negative result does not preclude SARS-CoV-2 infection  and should not be used as the sole basis for treatment or other  patient management decisions.  A negative result may occur with  improper specimen collection / handling, submission of specimen other  than nasopharyngeal swab, presence of viral mutation(s) within the  areas targeted by this assay, and inadequate number of viral copies  (<250 copies / mL). A negative result must be combined with clinical  observations, patient history, and epidemiological information. If result is POSITIVE SARS-CoV-2 target nucleic acids are DETECTED. The SARS-CoV-2 RNA is generally detectable in upper and lower  respiratory specimens dur ing the acute phase of infection.  Positive  results are indicative of active  infection with SARS-CoV-2.  Clinical  correlation with patient history and other diagnostic information is  necessary to determine patient infection status.  Positive results do  not rule out bacterial infection or co-infection with other viruses. If result is PRESUMPTIVE POSTIVE SARS-CoV-2 nucleic acids MAY BE PRESENT.   A presumptive positive result was obtained on the submitted specimen  and confirmed on repeat testing.  While 2019 novel coronavirus  (SARS-CoV-2) nucleic acids may be present in the submitted sample  additional confirmatory testing may be necessary for epidemiological  and / or clinical management purposes  to differentiate between  SARS-CoV-2 and other Sarbecovirus currently known to infect humans.  If clinically indicated additional testing with an alternate test  methodology [phone]) is advised. The SARS-CoV-2 RNA is generally  detectable in upper and lower respiratory sp ecimens during the acute  phase of infection. The expected result is Negative. Fact Sheet for Patients:  StrictlyIdeas.no Fact Sheet for Healthcare Providers: BankingDealers.co.za This test is not yet approved or cleared by the [place] FDA and has been authorized for detection and/or diagnosis of SARS-CoV-2 by FDA under an Emergency Use Authorization (EUA).  This EUA will remain in effect (meaning this test can be used) for the duration of the COVID-19 declaration under Section 564(b)(1) of the Act, 21 U.S.C. section 360bbb-3(b)(1), unless the authorization is terminated or revoked sooner. Performed at [facility], [address] [place], [place] [address]   MRSA PCR Screening     Status: None   Collection Time: 03/06/19  8:15 PM  Result Value Ref Range Status   MRSA by PCR NEGATIVE NEGATIVE Final    Comment:        The GeneXpert MRSA Assay (FDA approved for NASAL specimens only), is one component of a comprehensive MRSA  colonization surveillance program. It is not intended to diagnose MRSA infection nor to guide or monitor treatment for MRSA infections. Performed at [facility], [address] [place], [place] [address]   Novel Coronavirus, NAA (hospital order; send-out to ref lab)     Status: None   Collection Time: 03/08/19  6:34 PM  Result Value Ref Range Status   SARS-CoV-2, NAA NOT DETECTED NOT DETECTED Final    Comment: (NOTE) This test was developed and its performance characteristics determined by [employer]. This test has not been FDA cleared or approved. This test has been authorized by FDA under an Emergency Use Authorization (EUA). This test is only authorized for the duration of time the declaration that circumstances exist justifying the authorization of the emergency use of in vitro diagnostic tests for detection of SARS-CoV-2 virus and/or diagnosis of COVID-19 infection under section 564(b)(1) of the Act, 21 U.S.C.  360bbb-3(b)(1), unless the authorization is terminated or revoked sooner. When diagnostic testing is negative, the possibility of a false negative result should be considered in the context of a patient's recent exposures and the presence of clinical signs and symptoms consistent with COVID-19. An individual without symptoms of COVID-19 and who is not shedding SARS-CoV-2 virus would expect to have a negative (not detected) result in this assay. Performed  At: [facility] [place], [place] [id number] Rush Farmer MD EH:[id number]    [place]  Final    Comment: Performed at [facility], [address] [place], [place] [address]    Coagulation Studies: No results for input(s): LABPROT, INR in the last 72 hours.  Urinalysis: No results for input(s): COLORURINE, LABSPEC, PHURINE, GLUCOSEU, HGBUR, BILIRUBINUR, KETONESUR, PROTEINUR, UROBILINOGEN, NITRITE, LEUKOCYTESUR in the last 72  hours.  Invalid input(s): APPERANCEUR    Imaging: No results found.   Medications:   . sodium chloride Stopped (03/11/19 1725)  . sodium chloride    . clindamycin (CLEOCIN) IV     . heparin      . lidocaine-EPINEPHrine      . aspirin  81 mg Oral Daily  . atorvastatin  40 mg Oral q1800  . Chlorhexidine Gluconate Cloth  6 each Topical Q0600  . citalopram  10 mg Oral Daily  . docusate sodium  100 mg Oral BID  . epoetin (EPOGEN/PROCRIT) injection  10,000 Units Intravenous Q M,W,F-HD  . feeding supplement (ENSURE ENLIVE)  237 mL Oral BID BM  . fluticasone  2 spray Each Nare Daily  . heparin  5,000 Units Subcutaneous Q8H  . hydrALAZINE  25 mg Oral TID WC  . insulin aspart  0-5 Units Subcutaneous QHS  . insulin aspart  0-9 Units Subcutaneous TID WC  . insulin aspart  10 Units Subcutaneous TID WC  . insulin glargine  35 Units Subcutaneous Daily  . lamoTRIgine  25 mg Oral BID  . levETIRAcetam  250 mg Oral BID  . loratadine  10 mg Oral Daily  . Melatonin  5 mg Oral QHS  . metoprolol tartrate  25 mg Oral BID  . multivitamin  1 tablet Oral QHS  . nystatin   Topical BID  . OLANZapine  10 mg Oral QHS  . pantoprazole  40 mg Oral BID  . polyethylene glycol  17 g Oral Daily  . senna-docusate  1 tablet Oral QHS  . sodium chloride flush  3 mL Intravenous Q12H  . vitamin B-12  1,000 mcg Oral Daily   sodium chloride, acetaminophen, albuterol, alum & mag hydroxide-simeth, clonazePAM, diphenhydrAMINE, docusate sodium, famotidine, HYDROmorphone (DILAUDID) injection, methylPREDNISolone (SOLU-MEDROL) injection, midazolam, ondansetron (ZOFRAN) IV, ondansetron, oxyCODONE-acetaminophen, promethazine  Assessment/ Plan:  [name] is a 78 y.o. white female with congestive heart failure, hypertension, diabetes mellitus type II, obstructive sleep apnea, nephrolithiasis   1. Acute renal failure on chronic kidney disease stage IV: baseline creatinine of 2.69, GFR of 17 on 09/28/18. First  dialysis was 5/22.  Chronic kidney disease secondary to diabetic nephropathy Acute renal failure secondary to acute cardiorenal syndrome.  Tunneled catheter placement for today and then hemodialysis   2. Anemia of chronic kidney disease: hemoglobin 8 EPO with HD treatment .   3. Secondary Hyperparathyroidism: PTH low at 51. Calcium and phosphorus at goal.   4. 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 With proteinuria. History of poor control. Hemoglobin A1c of 7.6% on 04/22/2018  5. Hypertension with diastolic  dysfunction congestive heart failure, pulmonary hypertension. Furosemide gtt with limited success, therefore transitioned to intermittent hemodialysis treatments.  Volume status has improved significantly.  Currently on hydralazine.    LOS: [place] 5/29/202012:40 PM

## 2019-03-16 NOTE — Progress Notes (Signed)
Request was made to start outpatient placement for hemodialysis. Patient chose [facility] for outpatient placement. Patient has been educated. No additional records are needed to start placement. Requesting to have patient sit in a chair for dialysis treatment today.

## 2019-03-16 NOTE — Progress Notes (Signed)
Post HD Tx   1.5 liters removal    03/16/19 2300  Hand-Off documentation  Report given to (Full Name) 2C RN 244  Report received from (Full Name) Trellis Paganini RN  Vital Signs  Temp 98 F (36.7 C)  Temp Source Oral  Pulse Rate 67  Pulse Rate Source Monitor  Resp (!) 21  BP (!) 148/50  BP Location Left Arm  BP Method Automatic  Patient Position (if appropriate) Lying  Oxygen Therapy  SpO2 97 %  O2 Device Nasal Cannula  O2 Flow Rate (L/min) 3 L/min  Post-Hemodialysis Assessment  Rinseback Volume (mL) 250 mL  Dialyzer Clearance Lightly streaked  Duration of HD Treatment -hour(s) 3.5 hour(s)  Hemodialysis Intake (mL) 500 mL  UF Total -Machine (mL) 1945 mL  Net UF (mL) 1445 mL  Tolerated HD Treatment Yes  Hemodialysis Catheter  Placement Date: 03/09/19    Site Condition No complications  Blue Lumen Status Capped (Central line)  Red Lumen Status Capped (Central line)  Catheter fill solution Heparin 1000 units/ml  Catheter fill volume (Arterial) 1.5 cc  Catheter fill volume (Venous) 1.5  Dressing Type Biopatch;Occlusive  Dressing Status Clean;Dry;Intact  Drainage Description None  Post treatment catheter status Capped and Clamped  Hemodialysis Catheter Right Internal jugular  Placement Date/Time: 03/16/19 1423   Time Out: Correct patient;Correct site;Correct procedure  Maximum sterile barrier precautions: Hand hygiene;Sterile gloves;Cap;Large sterile sheet;Mask;Sterile gown  Site Prep: Skin Prep Completely Dry at the Time ...  Site Condition No complications  Blue Lumen Status Flushed;Capped (Central line);Heparin locked  Red Lumen Status Flushed;Capped (Central line);Heparin locked  Purple Lumen Status N/A  Catheter fill solution Heparin 1000 units/ml  Catheter fill volume (Arterial) 1.5 cc  Catheter fill volume (Venous) 1.5  Dressing Type Gauze/Drain sponge;Occlusive  Dressing Status Clean;Dry;Intact  Interventions New dressing  Drainage Description None  Dressing  Change Due 03/17/19  Post treatment catheter status Capped and Clamped

## 2019-03-16 NOTE — H&P (Signed)
[facility] History & Physical Update  The patient was interviewed and re-examined.  The patient's previous History and Physical has been reviewed and is unchanged.  There is no change in the plan of care. We plan to proceed with the scheduled procedure.  Leotis Pain, MD  03/16/2019, 1:39 PM

## 2019-03-16 NOTE — Progress Notes (Signed)
Inpatient Diabetes Program Recommendations  AACE/ADA: New Consensus Statement on Inpatient Glycemic Control (2015)  Target Ranges:  Prepandial:   less than 140 mg/dL      Peak postprandial:   less than 180 mg/dL (1-2 hours)      Critically ill patients:  140 - 180 mg/dL    Results for KIIRA, BRACH (MRN [id number]) as of 03/16/2019 09:25  Ref. Range 03/15/2019 07:34 03/15/2019 12:11 03/15/2019 17:22 03/15/2019 20:54  Glucose-Capillary Latest Ref Range: 70 - 99 mg/dL 197 (H)  9 units NOVOLOG  297 (H)  12 units NOVOLOG  258 (H)  12 units NOVOLOG  187 (H)    30 units LANTUS   Results for AMEL, KITCH (MRN [id number]) as of 03/16/2019 09:25  Ref. Range 03/16/2019 07:38  Glucose-Capillary Latest Ref Range: 70 - 99 mg/dL 211 (H)    Home DM Meds: Lantus 55 units QHS                             Novolog 5 units if CBG >200 mg/dl                             Glipizide 5 mg daily  Current Orders: Lantus 30 units QHS                           Novolog Sensitive Correction Scale/ SSI (0-9 units) TID AC + HS                           Novolog 7 units TID with meals       MD- Please consider the following in-hospital insulin adjustments:  1. Increase Lantus further to 35 units QHS  2. Increase Novolog Meal Coverage to: Novolog 10 units TID with meals    --Will follow patient during hospitalization--  Wyn Quaker RN, MSN, CDE Diabetes Coordinator Inpatient Glycemic Control Team Team Pager: [phone] (8a-5p)

## 2019-03-16 NOTE — Progress Notes (Signed)
[place] [facility] [place] NAME: Carolyn Valdez    MR#:  [id number]  DATE OF BIRTH:  07/31/1941  SUBJECTIVE:   Chief Complaint  Patient presents with  . Shortness of Breath   Patient is seen at the bedside. Patient is found sitting up in bed in no acute distress. She continues to complain of SOB.  REVIEW OF SYSTEMS:  Review of Systems  Constitutional: Negative for chills, fever, malaise/fatigue and weight loss.  HENT: Negative for congestion, hearing loss and sore throat.   Eyes: Negative for blurred vision and double vision.  Respiratory: Positive for shortness of breath and wheezing. Negative for cough.   Cardiovascular: Positive for orthopnea and leg swelling. Negative for chest pain and palpitations.  Gastrointestinal: Positive for constipation. Negative for abdominal pain, diarrhea, nausea and vomiting.  Genitourinary: Negative for dysuria and urgency.  Musculoskeletal: Positive for back pain. Negative for myalgias.  Skin: Negative for rash.  Neurological: Negative for dizziness, sensory change, speech change, focal weakness and headaches.  Psychiatric/Behavioral: Positive for depression. The patient is nervous/anxious.    DRUG ALLERGIES:   Allergies  Allergen Reactions  . Sulfa Antibiotics Hives  . Biaxin [Clarithromycin] Hives  . Influenza A (H1n1) Monoval Vac Other (See Comments)    Pt states that she was told by her MD not to get the influenza vaccine.    . Morphine Other (See Comments)    Reaction:  Dizziness and confusion   . Pyridium [Phenazopyridine Hcl] Other (See Comments)    Reaction:  Unknown   . Ceftriaxone Anxiety  . Latex Rash  . Prednisone Rash  . Tape Rash   VITALS:  Blood pressure (!) 139/54, pulse 62, temperature 98.4 F (36.9 C), resp. rate 18, height 5\' 7"  (1.702 m), weight 115.2 kg, SpO2 97 %. PHYSICAL EXAMINATION:   GENERAL:  78 y.o.-year-old patient lying in the bed with no acute distress.  EYES: Pupils  equal, round, reactive to light and accommodation. No scleral icterus. Extraocular muscles intact.  HEENT: Head atraumatic, normocephalic. Oropharynx and nasopharynx clear.  NECK:  Supple, no jugular venous distention. No thyroid enlargement, no tenderness.  LUNGS: Decreased breath sounds bilaterally, no wheezing, rales,rhonchi or crepitation. No use of accessory muscles of respiration.  CARDIOVASCULAR: S1, S2 normal. Systolic murmur, rubs, or gallops.  ABDOMEN: Soft, nontender, nondistended. Bowel sounds present. No organomegaly or mass.  EXTREMITIES: Bilateral pitting edema of peripheral extremities, cyanosis, or clubbing.  NEUROLOGIC: Cranial nerves II through XII are intact. Muscle strength 5/5 in all extremities. Sensation intact. Gait not checked.  PSYCHIATRIC: The patient is alert and oriented x 3.  SKIN: No obvious rash, lesion, or ulcer.   DATA REVIEWED:  LABORATORY PANEL:  Female CBC Recent Labs  Lab 03/16/19 0329  WBC 10.1  HGB 8.0*  HCT 25.6*  PLT 287   ------------------------------------------------------------------------------------------------------------------ Chemistries  Recent Labs  Lab 03/15/19 0341 03/16/19 0329  NA 134* 134*  K 4.8 5.4*  CL 95* 93*  CO2 30 31  GLUCOSE 225* 204*  BUN 47* 55*  CREATININE 2.99* 2.89*  CALCIUM 9.3 9.7  MG 2.6*  --    RADIOLOGY:  No results found. ASSESSMENT AND PLAN:   78 y.o. female with a history of diastolic congestive heart failure, chronic kidney disease, breast cancer, GI bleed, hypertension, hyperlipidemia, anxiety and depression, anemia, and diabetes mellitus presenting with worsening shortness of mass of breath.  1. Acute Congestive Heart Failure = Likely due to volume overload Last Echo 03/02/19  EF 50-55% - Beta-Blockade: Toprol - Improving with hemodialysis, HD today after catheter placement - Low salt diet  - Daily weights - PT eval  2. Acute on Chronic Kidney disease - BUN slightly up today - post  hemodialysis 5/26, Per Nephrology will plan for tunneled catheter placement today and then dialyse  - Nephrology to continue to follow  3. 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  - Hemoglobin A1c of 7.6% on 04/22/2018 - SSI - Holding metformin due to renal function  4. Hypertension -stable with a hemodialysis -Toprol as above - Hydralazine as needed  5. Anemia of CKD:  Hgb 8.3.  Receiving Epogen with each treatment.  Iron saturation 15%, ferritin 127, 10/23/2018.   6.  Hyperkalemia -slightly elevated  7.  Hyperlipidemia -continue atorvastatin  8.  Anxiety and depression -continue Zyprexa and Celexa -Also on Lamictal likely for mood stabilization -On Keppra unclear rationale, no history of seizure?    All the records are reviewed and case discussed with Care Management/Social Worker. Management plans discussed with the patient, family and they are in agreement.  CODE STATUS: Full Code  TOTAL TIME TAKING CARE OF THIS PATIENT: 37 minutes.   More than 50% of the time was spent in counseling/coordination of care: YES  POSSIBLE D/C IN 1-2 DAYS, DEPENDING ON CLINICAL CONDITION.   on 03/16/2019 at 1:18 PM  This patient was staffed with Dr. Vianne Bulls, Lise Auer who personally evaluated patient, reviewed documentation and agreed with assessment and plan of care as above.  Rufina Falco, DNP, FNP-BC [facility] Nurse Practitioner   Between 7am to 6pm - Pager [phone]  After 6pm go to [email or web address] - Proofreader  [facility]  Office  [phone]  CC: Primary care physician; [facility]  Note: This dictation was prepared with Dragon dictation along with smaller phrase technology. Any transcriptional errors that result from this process are unintentional.

## 2019-03-16 NOTE — Progress Notes (Signed)
Pt off floor to specials for procedure

## 2019-03-16 NOTE — Plan of Care (Signed)
  Problem: Clinical Measurements: Goal: Ability to maintain clinical measurements within normal limits will improve Outcome: Progressing Goal: Diagnostic test results will improve Outcome: Progressing Goal: Respiratory complications will improve Outcome: Progressing   Problem: Elimination: Goal: Will not experience complications related to urinary retention Outcome: Progressing   Problem: Pain Managment: Goal: General experience of comfort will improve Outcome: Progressing   Problem: Safety: Goal: Ability to remain free from injury will improve Outcome: Progressing   Problem: Skin Integrity: Goal: Risk for impaired skin integrity will decrease Outcome: Progressing   Problem: Education: Goal: Ability to verbalize understanding of medication therapies will improve Outcome: Progressing   Problem: Cardiac: Goal: Ability to achieve and maintain adequate cardiopulmonary perfusion will improve Outcome: Progressing

## 2019-03-16 NOTE — Op Note (Signed)
OPERATIVE NOTE    PRE-OPERATIVE DIAGNOSIS: 1. ESRD   POST-OPERATIVE DIAGNOSIS: same as above  PROCEDURE: 1. Ultrasound guidance for vascular access to the right internal jugular vein 2. Fluoroscopic guidance for placement of catheter 3. Placement of a 19 cm tip to cuff tunneled hemodialysis catheter via the right internal jugular vein  SURGEON: Leotis Pain, MD  ANESTHESIA:  Local with Moderate conscious sedation for approximately 20 minutes using 2 mg of Versed and 50 mcg of Fentanyl  ESTIMATED BLOOD LOSS: 10 cc  FLUORO TIME: less than one minute  CONTRAST: none  FINDING(S): 1.  Patent right internal jugular vein  SPECIMEN(S):  None  INDICATIONS:   Carolyn Valdez is a 78 y.o.female who presents with renal failure.  The patient needs long term dialysis access for their ESRD, and a Permcath is necessary.  Risks and benefits are discussed and informed consent is obtained.    DESCRIPTION: After obtaining full informed written consent, the patient was brought back to the vascular suited. The patient's right neck and chest were sterilely prepped and draped in a sterile surgical field was created. Moderate conscious sedation was administered during a face to face encounter with the patient throughout the procedure with my supervision of the RN administering medicines and monitoring the patient's vital signs, pulse oximetry, telemetry and mental status throughout from the start of the procedure until the patient was taken to the recovery room.  The right internal jugular vein was visualized with ultrasound and found to be patent. It was then accessed under direct ultrasound guidance and a permanent image was recorded. A wire was placed. After skin nick and dilatation, the peel-away sheath was placed over the wire. I then turned my attention to an area under the clavicle. Approximately 1-2 fingerbreadths below the clavicle a small counterincision was created and tunneled from the subclavicular  incision to the access site. Using fluoroscopic guidance, a 19 centimeter tip to cuff tunneled hemodialysis catheter was selected, and tunneled from the subclavicular incision to the access site. It was then placed through the peel-away sheath and the peel-away sheath was removed. Using fluoroscopic guidance the catheter tips were parked in the right atrium. The appropriate distal connectors were placed. It withdrew blood well and flushed easily with heparinized saline and a concentrated heparin solution was then placed. It was secured to the chest wall with 2 Prolene sutures. The access incision was closed single 4-0 Monocryl. A 4-0 Monocryl pursestring suture was placed around the exit site. Sterile dressings were placed. The patient tolerated the procedure well and was taken to the recovery room in stable condition.  COMPLICATIONS: None  CONDITION: Stable  Leotis Pain, MD 03/16/2019 2:51 PM   This note was created with Dragon Medical transcription system. Any errors in dictation are purely unintentional.

## 2019-03-16 NOTE — Progress Notes (Signed)
Post HD Assessment  1.5 liters removed, pt stable and tolerated tx well    03/16/19 2300  Neurological  Level of Consciousness Alert  Orientation Level Oriented X4  Respiratory  Respiratory Pattern Regular  Bilateral Breath Sounds Diminished;Fine crackles  R Lower Breath Sounds Fine crackles  L Lower Breath Sounds Fine crackles  Cardiac  Pulse Regular  ECG Monitor Yes  Cardiac Rhythm NSR  Vascular  R Radial Pulse +2  L Radial Pulse +2  Generalized Edema +2  Integumentary  Integumentary (WDL) X  Skin Color Appropriate for ethnicity  Skin Condition Dry  Skin Integrity Ecchymosis;MASD;Abrasion  Ecchymosis Location Buttocks;Abdomen  Ecchymosis Location Orientation Right;Left  Additional Integumentary Comments  (HD pt)  Musculoskeletal  Musculoskeletal (WDL) X  Generalized Weakness Yes  Gastrointestinal  Bowel Sounds Assessment Active  GU Assessment  Genitourinary (WDL) X  Genitourinary Symptoms External catheter (new Hd pt)  Psychosocial  Psychosocial (WDL) WDL  Patient Behaviors Appropriate for situation;Cooperative;Calm

## 2019-03-16 NOTE — Progress Notes (Signed)
Patient went to dialysis before shift change, will assess when patient come back from dialysis.

## 2019-03-16 NOTE — Progress Notes (Signed)
Patient still at dialysis.

## 2019-03-16 NOTE — Progress Notes (Signed)
PT Cancellation Note  Patient Details Name: Carolyn Valdez MRN: [id number] DOB: January 22, 1941   Cancelled Treatment:    Reason Eval/Treat Not Completed: Patient at procedure or test/unavailable. Pt currently out of room for HD cath placement. Will re-attempt.   Navraj Dreibelbis 03/16/2019, 3:00 PM  Greggory Stallion, PT, DPT [phone]

## 2019-03-17 LAB — GLUCOSE, CAPILLARY
Glucose-Capillary: 257 mg/dL — ABNORMAL HIGH (ref 70–99)
Glucose-Capillary: 241 mg/dL — ABNORMAL HIGH (ref 70–99)
Glucose-Capillary: 267 mg/dL — ABNORMAL HIGH (ref 70–99)
Glucose-Capillary: 258 mg/dL — ABNORMAL HIGH (ref 70–99)

## 2019-03-17 LAB — BASIC METABOLIC PANEL
Anion gap: 9 (ref 5–15)
BUN: 27 mg/dL — ABNORMAL HIGH (ref 8–23)
CO2: 32 mmol/L (ref 22–32)
Calcium: 9 mg/dL (ref 8.9–10.3)
Chloride: 96 mmol/L — ABNORMAL LOW (ref 98–111)
Creatinine, Ser: 1.97 mg/dL — ABNORMAL HIGH (ref 0.44–1.00)
GFR calc Af Amer: 28 mL/min — ABNORMAL LOW (ref >60)
GFR calc non Af Amer: 24 mL/min — ABNORMAL LOW (ref >60)
Glucose, Bld: 239 mg/dL — ABNORMAL HIGH (ref 70–99)
Potassium: 5.2 mmol/L — ABNORMAL HIGH (ref 3.5–5.1)
Sodium: 137 mmol/L (ref 135–145)

## 2019-03-17 LAB — CBC WITH DIFFERENTIAL/PLATELET
Abs Immature Granulocytes: 0.09 10*3/uL — ABNORMAL HIGH (ref 0.00–0.07)
Basophils Absolute: 0.1 10*3/uL (ref 0.0–0.1)
Basophils Relative: 1 %
Eosinophils Absolute: 0.3 10*3/uL (ref 0.0–0.5)
Eosinophils Relative: 4 %
HCT: 26.7 % — ABNORMAL LOW (ref 36.0–46.0)
Hemoglobin: 8.3 g/dL — ABNORMAL LOW (ref 12.0–15.0)
Immature Granulocytes: 1 %
Lymphocytes Relative: 17 %
Lymphs Abs: 1.5 10*3/uL (ref 0.7–4.0)
MCH: 29.3 pg (ref 26.0–34.0)
MCHC: 31.1 g/dL (ref 30.0–36.0)
MCV: 94.3 fL (ref 80.0–100.0)
Monocytes Absolute: 1.2 10*3/uL — ABNORMAL HIGH (ref 0.1–1.0)
Monocytes Relative: 14 %
Neutro Abs: 5.5 10*3/uL (ref 1.7–7.7)
Neutrophils Relative %: 63 %
Platelets: 277 10*3/uL (ref 150–400)
RBC: 2.83 MIL/uL — ABNORMAL LOW (ref 3.87–5.11)
RDW: 16 % — ABNORMAL HIGH (ref 11.5–15.5)
WBC: 8.6 10*3/uL (ref 4.0–10.5)
nRBC: 0 % (ref 0.0–0.2)

## 2019-03-17 NOTE — Progress Notes (Signed)
Talked to Dr. Juleen China if ok to discontinue trialysis catheter, ok to discontinue. Let incomind RN know.

## 2019-03-17 NOTE — Progress Notes (Addendum)
[place] [facility] [place] NAME: Eppie Barhorst    MR#:  [id number]  DATE OF BIRTH:  1940/12/28  SUBJECTIVE:   Chief Complaint  Patient presents with  . Shortness of Breath   Patient had a PermCath placed yesterday She has to sit in a chair    REVIEW OF SYSTEMS:  Review of Systems  Constitutional: Negative for chills, fever, malaise/fatigue and weight loss.  HENT: Negative for congestion, hearing loss and sore throat.   Eyes: Negative for blurred vision and double vision.  Respiratory: Negative for cough, shortness of breath and wheezing.   Cardiovascular: Negative for chest pain, palpitations, orthopnea and leg swelling.  Gastrointestinal: Negative for abdominal pain, constipation, diarrhea, nausea and vomiting.  Genitourinary: Negative for dysuria and urgency.  Musculoskeletal: Negative for back pain and myalgias.  Skin: Negative for rash.  Neurological: Negative for dizziness, sensory change, speech change, focal weakness and headaches.  Psychiatric/Behavioral: Positive for depression. The patient is not nervous/anxious.    DRUG ALLERGIES:   Allergies  Allergen Reactions  . Sulfa Antibiotics Hives  . Biaxin [Clarithromycin] Hives  . Influenza A (H1n1) Monoval Vac Other (See Comments)    Pt states that she was told by her MD not to get the influenza vaccine.    . Morphine Other (See Comments)    Reaction:  Dizziness and confusion   . Pyridium [Phenazopyridine Hcl] Other (See Comments)    Reaction:  Unknown   . Ceftriaxone Anxiety  . Latex Rash  . Prednisone Rash  . Tape Rash   VITALS:  Blood pressure (!) 126/55, pulse 61, temperature 98.3 F (36.8 C), temperature source Oral, resp. rate 18, height 5\' 7"  (1.702 m), weight 115.5 kg, SpO2 98 %. PHYSICAL EXAMINATION:   GENERAL:  78 y.o.-year-old patient lying in the bed with no acute distress.  EYES: Pupils equal, round, reactive to light and accommodation. No scleral icterus.  Extraocular muscles intact.  HEENT: Head atraumatic, normocephalic. Oropharynx and nasopharynx clear.  NECK:  Supple, no jugular venous distention. No thyroid enlargement, no tenderness.  LUNGS: Decreased breath sounds bilaterally, no wheezing, rales,rhonchi or crepitation. No use of accessory muscles of respiration.  CARDIOVASCULAR: S1, S2 normal. Systolic murmur, rubs, or gallops.  ABDOMEN: Soft, nontender, nondistended. Bowel sounds present. No organomegaly or mass.  EXTREMITIES: Bilateral pitting edema of peripheral extremities, cyanosis, or clubbing.  NEUROLOGIC: Cranial nerves II through XII are intact. Muscle strength 5/5 in all extremities. Sensation intact. Gait not checked.  PSYCHIATRIC: The patient is alert and oriented x 3.  SKIN: No obvious rash, lesion, or ulcer.   DATA REVIEWED:  LABORATORY PANEL:  Female CBC Recent Labs  Lab 03/17/19 0504  WBC 8.6  HGB 8.3*  HCT 26.7*  PLT 277   ------------------------------------------------------------------------------------------------------------------ Chemistries  Recent Labs  Lab 03/15/19 0341  03/17/19 0504  NA 134*   < > 137  K 4.8   < > 5.2*  CL 95*   < > 96*  CO2 30   < > 32  GLUCOSE 225*   < > 239*  BUN 47*   < > 27*  CREATININE 2.99*   < > 1.97*  CALCIUM 9.3   < > 9.0  MG 2.6*  --   --    < > = values in this interval not displayed.   RADIOLOGY:  No results found. ASSESSMENT AND PLAN:   78 y.o. female with a history of diastolic congestive heart failure, chronic kidney disease, breast  cancer, GI bleed, hypertension, hyperlipidemia, anxiety and depression, anemia, and diabetes mellitus presenting with worsening shortness of mass of breath.  1. Acute Congestive Heart Failure = due to volume overload Last Echo 03/02/19 EF 50-55% - Beta-Blockade: Toprol - Improving with hemodialysis, hemodialysis today patient instructed to sit - Low salt diet  - Daily weights - PT eval  2. Acute on Chronic Kidney disease  - BUN slightly up today - post hemodialysis 5/26, Per Nephrology will plan for tunneled catheter placement today and then dialyse  - Nephrology to continue to follow  3. 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  - Hemoglobin A1c of 7.6% on 04/22/2018 - SSI - Holding metformin due to renal function  4. Hypertension -stable with a hemodialysis -Toprol as above - Hydralazine as needed  5. Anemia of CKD:  Hgb 8.3.  Receiving Epogen with each treatment.  Iron saturation 15%, ferritin 127, 10/23/2018.   6.  Hyperkalemia -slightly elevated  7.  Hyperlipidemia -continue atorvastatin  8.  Anxiety and depression -continue Zyprexa and Celexa -Also on Lamictal likely for mood stabilization -On Keppra unclear rationale, no history of seizure?    All the records are reviewed and case discussed with Care Management/Social Worker. Management plans discussed with the patient, family and they are in agreement.  CODE STATUS: Full Code  TOTAL TIME TAKING CARE OF THIS PATIENT: 35 minutes.   More than 50% of the time was spent in counseling/coordination of care: YES  POSSIBLE D/C IN 1-2 DAYS, DEPENDING ON CLINICAL CONDITION.   on 03/17/2019 at 3:00 PM  Between 7am to 6pm - Pager - [phone]  After 6pm go to [email or web address] - Proofreader  [employer] [facility]  Office  [phone]  CC: Primary care physician; [facility]  Note: This dictation was prepared with Dragon dictation along with smaller phrase technology. Any transcriptional errors that result from this process are unintentional.

## 2019-03-17 NOTE — Progress Notes (Signed)
[facility]  ROUNDING NOTE   Subjective:   Permcath placed yesterday. Hemodialysis treatment last night. Tolerated treatment well. UF of 1467mL.  UOP 188mL.   Femoral temp HD catheter removed this morning.   Objective:  Vital signs in last 24 hours:  Temp:  [98 F (36.7 C)-98.7 F (37.1 C)] 98.3 F (36.8 C) (05/30 0736) Pulse Rate:  [59-69] 61 (05/30 0736) Resp:  [12-25] 18 (05/30 0423) BP: (113-163)/(42-86) 126/55 (05/30 0736) SpO2:  [93 %-100 %] 98 % (05/30 0736) Weight:  [115.5 kg-115.9 kg] 115.5 kg (05/30 0500)  Weight change: 0.7 kg Filed Weights   03/16/19 0623 03/16/19 1900 03/17/19 0500  Weight: 115.2 kg 115.9 kg 115.5 kg    Intake/Output: I/O last 3 completed shifts: In: 37 [IV Piggyback:50] Out: 5465 [Urine:1850; KPTWS:5681]   Intake/Output this shift:  No intake/output data recorded.  Physical Exam: General: NAD, laying in bed  Head: Normocephalic, atraumatic. Moist oral mucosal membranes  Eyes: Anicteric, PERRL  Neck: No JVD  Lungs:  Clear to auscultation, 2 L [place]  Heart: Regular rate and rhythm  Abdomen:  Soft, nontender, obese  Extremities:  1+ peripheral edema.  Neurologic: Nonfocal, moving all four extremities  Skin: No lesions  Access: RIJ permcath 5/29 Dr. Lucky Cowboy    Basic Metabolic Panel: Recent Labs  Lab 03/10/19 1708  03/12/19 1109 03/13/19 0327 03/14/19 0331 03/15/19 0341 03/16/19 0329 03/17/19 0504  NA  --    < > 135 134* 134* 134* 134* 137  K  --    < > 4.8 4.8 4.8 4.8 5.4* 5.2*  CL  --    < > 95* 95* 93* 95* 93* 96*  CO2  --    < > 33* 32 32 30 31 32  GLUCOSE  --    < > 201* 187* 244* 225* 204* 239*  BUN  --    < > 59* 36* 29* 47* 55* 27*  CREATININE  --    < > 3.00* 2.33* 2.42* 2.99* 2.89* 1.97*  CALCIUM  --    < > 9.5 8.9 9.2 9.3 9.7 9.0  MG  --   --   --   --  2.5* 2.6*  --   --   PHOS 4.1  --  3.8 3.0  --   --   --   --    < > = values in this interval not displayed.    Liver Function Tests: Recent Labs   Lab 03/12/19 1109 03/13/19 0327  ALBUMIN 3.1* 3.2*   No results for input(s): LIPASE, AMYLASE in the last 168 hours. No results for input(s): AMMONIA in the last 168 hours.  CBC: Recent Labs  Lab 03/12/19 0727 03/13/19 0327 03/14/19 0331 03/16/19 0329 03/17/19 0504  WBC 7.6 8.9 8.4 10.1 8.6  NEUTROABS  --   --  4.9  --  5.5  HGB 7.8* 7.9* 8.3* 8.0* 8.3*  HCT 25.4* 25.2* 26.7* 25.6* 26.7*  MCV 94.1 92.6 94.0 93.4 94.3  PLT 221 225 237 287 277    Cardiac Enzymes: No results for input(s): CKTOTAL, CKMB, CKMBINDEX, TROPONINI in the last 168 hours.  BNP: Invalid input(s): POCBNP  CBG: Recent Labs  Lab 03/16/19 0738 03/16/19 1131 03/16/19 1706 03/16/19 2345 03/17/19 0737  GLUCAP 211* 181* 184* 120* 257*    Microbiology: Results for orders placed or performed during the hospital encounter of 03/02/19  SARS Coronavirus 2 (CEPHEID - Performed in [facility] hospital lab), [facility]  Status: None   Collection Time: 03/02/19  6:35 AM  Result Value Ref Range Status   SARS Coronavirus 2 NEGATIVE NEGATIVE Final    Comment: (NOTE) If result is NEGATIVE SARS-CoV-2 target nucleic acids are NOT DETECTED. The SARS-CoV-2 RNA is generally detectable in upper and lower  respiratory specimens during the acute phase of infection. The lowest  concentration of SARS-CoV-2 viral copies this assay can detect is 250  copies / mL. A negative result does not preclude SARS-CoV-2 infection  and should not be used as the sole basis for treatment or other  patient management decisions.  A negative result may occur with  improper specimen collection / handling, submission of specimen other  than nasopharyngeal swab, presence of viral mutation(s) within the  areas targeted by this assay, and inadequate number of viral copies  (<250 copies / mL). A negative result must be combined with clinical  observations, patient history, and epidemiological information. If result is  POSITIVE SARS-CoV-2 target nucleic acids are DETECTED. The SARS-CoV-2 RNA is generally detectable in upper and lower  respiratory specimens dur ing the acute phase of infection.  Positive  results are indicative of active infection with SARS-CoV-2.  Clinical  correlation with patient history and other diagnostic information is  necessary to determine patient infection status.  Positive results do  not rule out bacterial infection or co-infection with other viruses. If result is PRESUMPTIVE POSTIVE SARS-CoV-2 nucleic acids MAY BE PRESENT.   A presumptive positive result was obtained on the submitted specimen  and confirmed on repeat testing.  While 2019 novel coronavirus  (SARS-CoV-2) nucleic acids may be present in the submitted sample  additional confirmatory testing may be necessary for epidemiological  and / or clinical management purposes  to differentiate between  SARS-CoV-2 and other Sarbecovirus currently known to infect humans.  If clinically indicated additional testing with an alternate test  methodology [phone]) is advised. The SARS-CoV-2 RNA is generally  detectable in upper and lower respiratory sp ecimens during the acute  phase of infection. The expected result is Negative. Fact Sheet for Patients:  StrictlyIdeas.no Fact Sheet for Healthcare Providers: BankingDealers.co.za This test is not yet approved or cleared by the [place] FDA and has been authorized for detection and/or diagnosis of SARS-CoV-2 by FDA under an Emergency Use Authorization (EUA).  This EUA will remain in effect (meaning this test can be used) for the duration of the COVID-19 declaration under Section 564(b)(1) of the Act, 21 U.S.C. section 360bbb-3(b)(1), unless the authorization is terminated or revoked sooner. Performed at [facility], [address] [place], [place] [address]   MRSA PCR Screening     Status: None   Collection  Time: 03/06/19  8:15 PM  Result Value Ref Range Status   MRSA by PCR NEGATIVE NEGATIVE Final    Comment:        The GeneXpert MRSA Assay (FDA approved for NASAL specimens only), is one component of a comprehensive MRSA colonization surveillance program. It is not intended to diagnose MRSA infection nor to guide or monitor treatment for MRSA infections. Performed at [facility], [address] [place], [place] [address]   Novel Coronavirus, NAA (hospital order; send-out to ref lab)     Status: None   Collection Time: 03/08/19  6:34 PM  Result Value Ref Range Status   SARS-CoV-2, NAA NOT DETECTED NOT DETECTED Final    Comment: (NOTE) This test was developed and its performance characteristics determined by [employer]. This test  has not been FDA cleared or approved. This test has been authorized by FDA under an Emergency Use Authorization (EUA). This test is only authorized for the duration of time the declaration that circumstances exist justifying the authorization of the emergency use of in vitro diagnostic tests for detection of SARS-CoV-2 virus and/or diagnosis of COVID-19 infection under section 564(b)(1) of the Act, 21 U.S.C. 433IRJ-1(O)(8), unless the authorization is terminated or revoked sooner. When diagnostic testing is negative, the possibility of a false negative result should be considered in the context of a patient's recent exposures and the presence of clinical signs and symptoms consistent with COVID-19. An individual without symptoms of COVID-19 and who is not shedding SARS-CoV-2 virus would expect to have a negative (not detected) result in this assay. Performed  At: [facility] [place], [place] [id number] Rush Farmer MD SW:[id number]    [place]  Final    Comment: Performed at [facility], [address] [place], [place] [address]    Coagulation Studies: No results for  input(s): LABPROT, INR in the last 72 hours.  Urinalysis: No results for input(s): COLORURINE, LABSPEC, PHURINE, GLUCOSEU, HGBUR, BILIRUBINUR, KETONESUR, PROTEINUR, UROBILINOGEN, NITRITE, LEUKOCYTESUR in the last 72 hours.  Invalid input(s): APPERANCEUR    Imaging: No results found.   Medications:   . sodium chloride Stopped (03/11/19 1725)   . aspirin  81 mg Oral Daily  . atorvastatin  40 mg Oral q1800  . Chlorhexidine Gluconate Cloth  6 each Topical Q0600  . citalopram  10 mg Oral Daily  . docusate sodium  100 mg Oral BID  . epoetin (EPOGEN/PROCRIT) injection  10,000 Units Intravenous Q M,W,F-HD  . feeding supplement (ENSURE ENLIVE)  237 mL Oral BID BM  . fluticasone  2 spray Each Nare Daily  . heparin  5,000 Units Subcutaneous Q8H  . hydrALAZINE  25 mg Oral TID WC  . insulin aspart  0-5 Units Subcutaneous QHS  . insulin aspart  0-9 Units Subcutaneous TID WC  . insulin aspart  10 Units Subcutaneous TID WC  . insulin glargine  35 Units Subcutaneous Daily  . lamoTRIgine  25 mg Oral BID  . levETIRAcetam  250 mg Oral BID  . loratadine  10 mg Oral Daily  . Melatonin  5 mg Oral QHS  . metoprolol tartrate  25 mg Oral BID  . multivitamin  1 tablet Oral QHS  . nystatin   Topical BID  . OLANZapine  10 mg Oral QHS  . pantoprazole  40 mg Oral BID  . polyethylene glycol  17 g Oral Daily  . senna-docusate  1 tablet Oral QHS  . sodium chloride flush  3 mL Intravenous Q12H  . vitamin B-12  1,000 mcg Oral Daily   sodium chloride, acetaminophen, albuterol, alum & mag hydroxide-simeth, clonazePAM, docusate sodium, HYDROmorphone (DILAUDID) injection, ondansetron (ZOFRAN) IV, ondansetron, oxyCODONE-acetaminophen, promethazine  Assessment/ Plan:  [name] is a 78 y.o. white female with congestive heart failure, hypertension, diabetes mellitus type II, obstructive sleep apnea, nephrolithiasis   1. Acute renal failure on chronic kidney disease stage IV: baseline creatinine of 2.69,  GFR of 17 on 09/28/18. First dialysis was 5/22.  Last hemodialysis treatment was yesterday 5/29.  Chronic kidney disease secondary to diabetic nephropathy Acute renal failure secondary to acute cardiorenal syndrome.  Tunneled catheter placed Evaluate daily for dialysis need. No indication for dialysis at this point. Outpatient planning for [facility] [address]   2. Anemia of chronic kidney  disease: hemoglobin 8.3 EPO with HD treatment .   3. Secondary Hyperparathyroidism: PTH low at 51. Calcium and phosphorus at goal.   4. 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 With proteinuria. History of poor control. Hemoglobin A1c of 7.6% on 04/22/2018  5. Hypertension with diastolic dysfunction congestive heart failure, pulmonary hypertension. Furosemide gtt with limited success, therefore transitioned to intermittent hemodialysis treatments.  Volume status has improved significantly.  Currently on hydralazine.    LOS: [place] 5/30/202010:07 AM

## 2019-03-18 LAB — GLUCOSE, CAPILLARY
Glucose-Capillary: 208 mg/dL — ABNORMAL HIGH (ref 70–99)
Glucose-Capillary: 284 mg/dL — ABNORMAL HIGH (ref 70–99)
Glucose-Capillary: 389 mg/dL — ABNORMAL HIGH (ref 70–99)
Glucose-Capillary: 295 mg/dL — ABNORMAL HIGH (ref 70–99)

## 2019-03-18 MED ORDER — HYDROCORTISONE 0.5 % EX CREA
TOPICAL_CREAM | Freq: Four times a day (QID) | CUTANEOUS | Status: DC | PRN
  Filled 2019-03-18: qty 28.35

## 2019-03-18 MED ORDER — DIPHENHYDRAMINE HCL 25 MG PO CAPS
25.0000 mg | ORAL_CAPSULE | Freq: Four times a day (QID) | ORAL | Status: DC | PRN
  Administered 2019-03-18 (×2): 25 mg via ORAL
  Filled 2019-03-18 (×3): qty 1

## 2019-03-18 NOTE — Progress Notes (Signed)
[facility]  ROUNDING NOTE   Subjective:   Dialysis held yesterday.   Sitting in chair.   No new labs today  Objective:  Vital signs in last 24 hours:  Temp:  [98 F (36.7 C)-98.8 F (37.1 C)] 98 F (36.7 C) (05/31 0754) Pulse Rate:  [56-67] 67 (05/31 1224) Resp:  [16-18] 18 (05/31 1224) BP: (142-172)/(49-55) 172/55 (05/31 1224) SpO2:  [97 %-100 %] 98 % (05/31 1224) Weight:  [115.6 kg] 115.6 kg (05/31 0521)  Weight change: -0.323 kg Filed Weights   03/16/19 1900 03/17/19 0500 03/18/19 0521  Weight: 115.9 kg 115.5 kg 115.6 kg    Intake/Output: I/O last 3 completed shifts: In: -  Out: 2095 [Urine:650; IWLNL:8921]   Intake/Output this shift:  Total I/O In: -  Out: 850 [Urine:850]  Physical Exam: General: NAD, laying in bed  Head: Normocephalic, atraumatic. Moist oral mucosal membranes  Eyes: Anicteric, PERRL  Neck: No JVD  Lungs:  Clear to auscultation, 2 L [place]  Heart: Regular rate and rhythm  Abdomen:  Soft, nontender, obese  Extremities:  1+ nonpitting peripheral edema.  Neurologic: Nonfocal, moving all four extremities  Skin: No lesions  Access: RIJ permcath 5/29 Dr. Lucky Cowboy    Basic Metabolic Panel: Recent Labs  Lab 03/12/19 1109 03/13/19 0327 03/14/19 0331 03/15/19 0341 03/16/19 0329 03/17/19 0504  NA 135 134* 134* 134* 134* 137  K 4.8 4.8 4.8 4.8 5.4* 5.2*  CL 95* 95* 93* 95* 93* 96*  CO2 33* 32 32 30 31 32  GLUCOSE 201* 187* 244* 225* 204* 239*  BUN 59* 36* 29* 47* 55* 27*  CREATININE 3.00* 2.33* 2.42* 2.99* 2.89* 1.97*  CALCIUM 9.5 8.9 9.2 9.3 9.7 9.0  MG  --   --  2.5* 2.6*  --   --   PHOS 3.8 3.0  --   --   --   --     Liver Function Tests: Recent Labs  Lab 03/12/19 1109 03/13/19 0327  ALBUMIN 3.1* 3.2*   No results for input(s): LIPASE, AMYLASE in the last 168 hours. No results for input(s): AMMONIA in the last 168 hours.  CBC: Recent Labs  Lab 03/12/19 0727 03/13/19 0327 03/14/19 0331 03/16/19 0329  03/17/19 0504  WBC 7.6 8.9 8.4 10.1 8.6  NEUTROABS  --   --  4.9  --  5.5  HGB 7.8* 7.9* 8.3* 8.0* 8.3*  HCT 25.4* 25.2* 26.7* 25.6* 26.7*  MCV 94.1 92.6 94.0 93.4 94.3  PLT 221 225 237 287 277    Cardiac Enzymes: No results for input(s): CKTOTAL, CKMB, CKMBINDEX, TROPONINI in the last 168 hours.  BNP: Invalid input(s): POCBNP  CBG: Recent Labs  Lab 03/17/19 1203 03/17/19 1626 03/17/19 2122 03/18/19 0751 03/18/19 1138  GLUCAP 241* 267* 258* 208* 284*    Microbiology: Results for orders placed or performed during the hospital encounter of 03/02/19  SARS Coronavirus 2 (CEPHEID - Performed in [place] hospital lab), Hosp Order     Status: None   Collection Time: 03/02/19  6:35 AM  Result Value Ref Range Status   SARS Coronavirus 2 NEGATIVE NEGATIVE Final    Comment: (NOTE) If result is NEGATIVE SARS-CoV-2 target nucleic acids are NOT DETECTED. The SARS-CoV-2 RNA is generally detectable in upper and lower  respiratory specimens during the acute phase of infection. The lowest  concentration of SARS-CoV-2 viral copies this assay can detect is 250  copies / mL. A negative result does not preclude SARS-CoV-2 infection  and should  not be used as the sole basis for treatment or other  patient management decisions.  A negative result may occur with  improper specimen collection / handling, submission of specimen other  than nasopharyngeal swab, presence of viral mutation(s) within the  areas targeted by this assay, and inadequate number of viral copies  (<250 copies / mL). A negative result must be combined with clinical  observations, patient history, and epidemiological information. If result is POSITIVE SARS-CoV-2 target nucleic acids are DETECTED. The SARS-CoV-2 RNA is generally detectable in upper and lower  respiratory specimens dur ing the acute phase of infection.  Positive  results are indicative of active infection with SARS-CoV-2.  Clinical  correlation with  patient history and other diagnostic information is  necessary to determine patient infection status.  Positive results do  not rule out bacterial infection or co-infection with other viruses. If result is PRESUMPTIVE POSTIVE SARS-CoV-2 nucleic acids MAY BE PRESENT.   A presumptive positive result was obtained on the submitted specimen  and confirmed on repeat testing.  While 2019 novel coronavirus  (SARS-CoV-2) nucleic acids may be present in the submitted sample  additional confirmatory testing may be necessary for epidemiological  and / or clinical management purposes  to differentiate between  SARS-CoV-2 and other Sarbecovirus currently known to infect humans.  If clinically indicated additional testing with an alternate test  methodology [phone]) is advised. The SARS-CoV-2 RNA is generally  detectable in upper and lower respiratory sp ecimens during the acute  phase of infection. The expected result is Negative. Fact Sheet for Patients:  StrictlyIdeas.no Fact Sheet for Healthcare Providers: BankingDealers.co.za This test is not yet approved or cleared by the [place] FDA and has been authorized for detection and/or diagnosis of SARS-CoV-2 by FDA under an Emergency Use Authorization (EUA).  This EUA will remain in effect (meaning this test can be used) for the duration of the COVID-19 declaration under Section 564(b)(1) of the Act, 21 U.S.C. section 360bbb-3(b)(1), unless the authorization is terminated or revoked sooner. Performed at [facility], [address] [place], [place] [address]   MRSA PCR Screening     Status: None   Collection Time: 03/06/19  8:15 PM  Result Value Ref Range Status   MRSA by PCR NEGATIVE NEGATIVE Final    Comment:        The GeneXpert MRSA Assay (FDA approved for NASAL specimens only), is one component of a comprehensive MRSA colonization surveillance program. It is not intended to  diagnose MRSA infection nor to guide or monitor treatment for MRSA infections. Performed at [facility], [address] [place], [place] [address]   Novel Coronavirus, NAA (hospital order; send-out to ref lab)     Status: None   Collection Time: 03/08/19  6:34 PM  Result Value Ref Range Status   SARS-CoV-2, NAA NOT DETECTED NOT DETECTED Final    Comment: (NOTE) This test was developed and its performance characteristics determined by [employer]. This test has not been FDA cleared or approved. This test has been authorized by FDA under an Emergency Use Authorization (EUA). This test is only authorized for the duration of time the declaration that circumstances exist justifying the authorization of the emergency use of in vitro diagnostic tests for detection of SARS-CoV-2 virus and/or diagnosis of COVID-19 infection under section 564(b)(1) of the Act, 21 U.S.C. 568LEX-5(T)(7), unless the authorization is terminated or revoked sooner. When diagnostic testing is negative, the possibility of a false negative result should be  considered in the context of a patient's recent exposures and the presence of clinical signs and symptoms consistent with COVID-19. An individual without symptoms of COVID-19 and who is not shedding SARS-CoV-2 virus would expect to have a negative (not detected) result in this assay. Performed  At: [facility] [place], [place] [id number] Rush Farmer MD PJ:[id number]    [place]  Final    Comment: Performed at [facility], [address] [place], [place] [address]    Coagulation Studies: No results for input(s): LABPROT, INR in the last 72 hours.  Urinalysis: No results for input(s): COLORURINE, LABSPEC, PHURINE, GLUCOSEU, HGBUR, BILIRUBINUR, KETONESUR, PROTEINUR, UROBILINOGEN, NITRITE, LEUKOCYTESUR in the last 72 hours.  Invalid input(s): APPERANCEUR    Imaging: No  results found.   Medications:   . sodium chloride Stopped (03/11/19 1725)   . aspirin  81 mg Oral Daily  . atorvastatin  40 mg Oral q1800  . Chlorhexidine Gluconate Cloth  6 each Topical Q0600  . citalopram  10 mg Oral Daily  . docusate sodium  100 mg Oral BID  . epoetin (EPOGEN/PROCRIT) injection  10,000 Units Intravenous Q M,W,F-HD  . feeding supplement (ENSURE ENLIVE)  237 mL Oral BID BM  . fluticasone  2 spray Each Nare Daily  . heparin  5,000 Units Subcutaneous Q8H  . hydrALAZINE  25 mg Oral TID WC  . insulin aspart  0-5 Units Subcutaneous QHS  . insulin aspart  0-9 Units Subcutaneous TID WC  . insulin aspart  10 Units Subcutaneous TID WC  . insulin glargine  35 Units Subcutaneous Daily  . lamoTRIgine  25 mg Oral BID  . levETIRAcetam  250 mg Oral BID  . loratadine  10 mg Oral Daily  . Melatonin  5 mg Oral QHS  . metoprolol tartrate  25 mg Oral BID  . multivitamin  1 tablet Oral QHS  . nystatin   Topical BID  . OLANZapine  10 mg Oral QHS  . pantoprazole  40 mg Oral BID  . polyethylene glycol  17 g Oral Daily  . senna-docusate  1 tablet Oral QHS  . sodium chloride flush  3 mL Intravenous Q12H  . vitamin B-12  1,000 mcg Oral Daily   sodium chloride, acetaminophen, albuterol, alum & mag hydroxide-simeth, clonazePAM, diphenhydrAMINE, docusate sodium, hydrocortisone cream, HYDROmorphone (DILAUDID) injection, ondansetron (ZOFRAN) IV, ondansetron, oxyCODONE-acetaminophen, promethazine  Assessment/ Plan:  Carolyn Valdez is a 78 y.o. white female with congestive heart failure, hypertension, diabetes mellitus type II, obstructive sleep apnea, nephrolithiasis   1. Acute renal failure on chronic kidney disease stage IV: baseline creatinine of 2.69, GFR of 17 on 09/28/18. First dialysis was 5/22.  Last hemodialysis treatment was yesterday 5/29.  Chronic kidney disease secondary to diabetic nephropathy Acute renal failure secondary to acute cardiorenal syndrome.  Tunneled catheter  placed Evaluate daily for dialysis need. Next treatment for tomorrow. Orders prepared.  Outpatient planning for [address]   2. Anemia of chronic kidney disease: hemoglobin 8.3 EPO with HD treatment .   3. Secondary Hyperparathyroidism: PTH low at 51. Calcium and phosphorus at goal.   4. 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 With proteinuria. History of poor control. Hemoglobin A1c of 7.6% on 04/22/2018  5. Hypertension with diastolic dysfunction congestive heart failure, pulmonary hypertension. Furosemide gtt with limited success, therefore transitioned to intermittent hemodialysis treatments.  Volume status has improved significantly.  Currently on hydralazine.    LOS: 16 Jahrel Borthwick  [place] 5/31/20203:09 PM

## 2019-03-18 NOTE — Progress Notes (Signed)
Physical Therapy Treatment Patient Details Name: Carolyn Valdez MRN: [id number] DOB: Dec 05, 1940 Today's Date: 03/18/2019    History of Present Illness  Pt. is a 78 y.o. female with a known history of diastolic congestive heart failure, chronic kidney disease, breast cancer, chest pain, GI bleed, hypertension, hyperlipidemia-lives in [place], for last few days she has worsening shortness of breath so came to emergency room for further management. Patient says she chronically sleeps in a recliner.  She does not have a primary cardiologist in the clinic. This time she denied any associated chest pain or palpitation.  She denied any associated cough fever or sputum production. In ER she was noted to have acute CHF exacerbation and given to hospitalist team. She is currently admitted for acute on chronic CHF, elevated troponin, and AOCKD IV-V.  Right femoral temp HD cath placement 03/09/19.    PT Comments    Patient in bed upon PT and nursing arrival. Patient is now able to be OOB with PT as femoral temp HD cath has been removed. Patient educated on need for sitting in chair to increase tolerance in sitting for dialysis. Patient agreeable to attempt. Supine to sit required x2 assistance due to excessive posterior trunk lean and limited UE assistance. Patient demonstrated ability to stand from EOB with MinA however requires Min-Mod A for SPT to chair with max cueing for sequencing/task orientation. Patient required assistance obtaining a comfortable position and had to be re-educated on importance of sitting for dialysis. Current POC remains appropriate at this time.    Follow Up Recommendations  Home health PT;Other (comment)(Return to ALF with PT services)     Equipment Recommendations  None recommended by PT    Recommendations for Other Services       Precautions / Restrictions Precautions Precautions: Fall Precaution Comments: R IJ cath Restrictions Weight Bearing Restrictions: No     Mobility  Bed Mobility Overal bed mobility: Needs Assistance Bed Mobility: Supine to Sit     Supine to sit: Mod assist;+2 for physical assistance     General bed mobility comments: Patient requires extra time and support for trunk and LE's x 2 assist due to posterior LOB  Transfers Overall transfer level: Needs assistance Equipment used: Rolling walker (2 wheeled) Transfers: Sit to/from [facility] Sit to Stand: Min assist;From elevated surface Stand pivot transfers: Min assist;Mod assist       General transfer comment: Patient requires max cueing for sequencing/task orientation, is very slow and fearful, became dizzy requiring PT assistance to complete SPT to chair.   Ambulation/Gait                 Stairs             Wheelchair Mobility    Modified Rankin (Stroke Patients Only)       Balance Overall balance assessment: Needs assistance Sitting-balance support: Single extremity supported Sitting balance-Leahy Scale: Fair Sitting balance - Comments: able to maintain seated EOB with posterior lean x2 minutes  Postural control: Posterior lean Standing balance support: Bilateral upper extremity supported Standing balance-Leahy Scale: Poor                              Cognition Arousal/Alertness: Awake/alert Behavior During Therapy: Flat affect Overall Cognitive Status: Within Functional Limits for tasks assessed  General Comments: follows commands 80% of times      Exercises Other Exercises Other Exercises: sitting EOB 2-3 minutes with decreasing support from Min A to Mod I. Patient educated on safe SPT to chair, positioned in chair for comfort and educated on need for sitting up due to having to be able to tolerate sitting for dialysis.     General Comments        Pertinent Vitals/Pain Pain Assessment: 0-10 Pain Score: 7  Pain Location: low back Pain Descriptors /  Indicators: Aching;Discomfort Pain Intervention(s): Monitored during session;Repositioned    Home Living                      Prior Function            PT Goals (current goals can now be found in the care plan section) Acute Rehab PT Goals Patient Stated Goal: To get better Progress towards PT goals: Progressing toward goals    Frequency    Min 2X/week      PT Plan Current plan remains appropriate    Co-evaluation              AM-PAC PT "6 Clicks" Mobility   Outcome Measure  Help needed turning from your back to your side while in a flat bed without using bedrails?: A Little Help needed moving from lying on your back to sitting on the side of a flat bed without using bedrails?: A Lot Help needed moving to and from a bed to a chair (including a wheelchair)?: A Little Help needed standing up from a chair using your arms (e.g., wheelchair or bedside chair)?: A Little Help needed to walk in hospital room?: A Lot Help needed climbing 3-5 steps with a railing? : Total 6 Click Score: 14    End of Session Equipment Utilized During Treatment: Oxygen;Gait belt Activity Tolerance: Patient limited by fatigue Patient left: with call bell/phone within reach;in chair;with chair alarm set;with nursing/sitter in room Nurse Communication: Mobility status PT Visit Diagnosis: Unsteadiness on feet (R26.81);Muscle weakness (generalized) (M62.81)     Time: 1025-8527 PT Time Calculation (min) (ACUTE ONLY): 19 min  Charges:  $Therapeutic Activity: 8-22 mins                     Janna Arch, PT, DPT     Janna Arch 03/18/2019, 2:29 PM

## 2019-03-18 NOTE — Progress Notes (Signed)
Pt up to the chair today. Pt set on the chair for about 3hrs.

## 2019-03-18 NOTE — Plan of Care (Signed)
  Problem: Education: Goal: Knowledge of General Education information will improve Description: Including pain rating scale, medication(s)/side effects and non-pharmacologic comfort measures Outcome: Progressing   Problem: Clinical Measurements: Goal: Respiratory complications will improve Outcome: Progressing Goal: Cardiovascular complication will be avoided Outcome: Progressing   Problem: Safety: Goal: Ability to remain free from injury will improve Outcome: Progressing   Problem: Skin Integrity: Goal: Risk for impaired skin integrity will decrease Outcome: Progressing   

## 2019-03-18 NOTE — Progress Notes (Signed)
[facility] [place] NAME: Carolyn Valdez    MR#:  [id number]  DATE OF BIRTH:  August 12, 1941  SUBJECTIVE:   Chief Complaint  Patient presents with  . Shortness of Breath   Patient did not have dialysis yesterday she is denying any complaints today   REVIEW OF SYSTEMS:  Review of Systems  Constitutional: Negative for chills, fever, malaise/fatigue and weight loss.  HENT: Negative for congestion, hearing loss and sore throat.   Eyes: Negative for blurred vision and double vision.  Respiratory: Negative for cough, shortness of breath and wheezing.   Cardiovascular: Negative for chest pain, palpitations, orthopnea and leg swelling.  Gastrointestinal: Negative for abdominal pain, constipation, diarrhea, nausea and vomiting.  Genitourinary: Negative for dysuria and urgency.  Musculoskeletal: Negative for back pain and myalgias.  Skin: Negative for rash.  Neurological: Negative for dizziness, sensory change, speech change, focal weakness and headaches.  Psychiatric/Behavioral: Positive for depression. The patient is not nervous/anxious.    DRUG ALLERGIES:   Allergies  Allergen Reactions  . Sulfa Antibiotics Hives  . Biaxin [Clarithromycin] Hives  . Influenza A (H1n1) Monoval Vac Other (See Comments)    Pt states that she was told by her MD not to get the influenza vaccine.    . Morphine Other (See Comments)    Reaction:  Dizziness and confusion   . Pyridium [Phenazopyridine Hcl] Other (See Comments)    Reaction:  Unknown   . Ceftriaxone Anxiety  . Latex Rash  . Prednisone Rash  . Tape Rash   VITALS:  Blood pressure (!) 172/55, pulse 67, temperature 98 F (36.7 C), temperature source Oral, resp. rate 18, height 5\' 7"  (1.702 m), weight 115.6 kg, SpO2 98 %. PHYSICAL EXAMINATION:   GENERAL:  78 y.o.-year-old patient lying in the bed with no acute distress.  EYES: Pupils equal, round, reactive to light and accommodation. No scleral  icterus. Extraocular muscles intact.  HEENT: Head atraumatic, normocephalic. Oropharynx and nasopharynx clear.  NECK:  Supple, no jugular venous distention. No thyroid enlargement, no tenderness.  LUNGS: Decreased breath sounds bilaterally, no wheezing, rales,rhonchi or crepitation. No use of accessory muscles of respiration.  CARDIOVASCULAR: S1, S2 normal. Systolic murmur, rubs, or gallops.  ABDOMEN: Soft, nontender, nondistended. Bowel sounds present. No organomegaly or mass.  EXTREMITIES: Bilateral pitting edema of peripheral extremities, cyanosis, or clubbing.  NEUROLOGIC: Cranial nerves II through XII are intact. Muscle strength 5/5 in all extremities. Sensation intact. Gait not checked.  PSYCHIATRIC: The patient is alert and oriented x 3.  SKIN: No obvious rash, lesion, or ulcer.   DATA REVIEWED:  LABORATORY PANEL:  Female CBC Recent Labs  Lab 03/17/19 0504  WBC 8.6  HGB 8.3*  HCT 26.7*  PLT 277   ------------------------------------------------------------------------------------------------------------------ Chemistries  Recent Labs  Lab 03/15/19 0341  03/17/19 0504  NA 134*   < > 137  K 4.8   < > 5.2*  CL 95*   < > 96*  CO2 30   < > 32  GLUCOSE 225*   < > 239*  BUN 47*   < > 27*  CREATININE 2.99*   < > 1.97*  CALCIUM 9.3   < > 9.0  MG 2.6*  --   --    < > = values in this interval not displayed.   RADIOLOGY:  No results found. ASSESSMENT AND PLAN:   78 y.o. female with a history of diastolic congestive heart failure, chronic kidney disease, breast cancer, GI  bleed, hypertension, hyperlipidemia, anxiety and depression, anemia, and diabetes mellitus presenting with worsening shortness of mass of breath.  1. Acute Congestive Heart Failure = due to volume overload Last Echo 03/02/19 EF 50-55% - Beta-Blockade: Toprol - Improving with hemodialysis, hemodialysis per nephrology -Patient will need to sit in a chair prior to getting a chair.  I have asked the nurse to  sit the patient in a chair. - Low salt diet  - Daily weights - PT eval  2. Acute on Chronic Kidney disease -  Status post PermCath Nephrology following   3. 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  - Hemoglobin A1c of 7.6% on 04/22/2018 - SSI - Holding metformin due to renal function  4. Hypertension -stable with a hemodialysis -Toprol as above - Hydralazine as needed  5. Anemia of CKD:  Hgb 8.3.  Receiving Epogen with each treatment.  Iron saturation 15%, ferritin 127, 10/23/2018.   6.  Hyperkalemia -slightly elevated  7.  Hyperlipidemia -continue atorvastatin  8.  Anxiety and depression -continue Zyprexa and Celexa -Also on Lamictal likely for mood stabilization -On Keppra unclear rationale, no history of seizure?    All the records are reviewed and case discussed with Care Management/Social Worker. Management plans discussed with the patient, family and they are in agreement.  CODE STATUS: Full Code  TOTAL TIME TAKING CARE OF THIS PATIENT: 35 minutes.   More than 50% of the time was spent in counseling/coordination of care: YES  POSSIBLE D/C IN 1-2 DAYS, DEPENDING ON CLINICAL CONDITION.   on 03/18/2019 at 1:29 PM  Between 7am to 6pm - Pager - [phone]  After 6pm go to [email or web address] - Proofreader  [facility]  Office  [phone]  CC: Primary care physician; [facility]  Note: This dictation was prepared with Dragon dictation along with smaller phrase technology. Any transcriptional errors that result from this process are unintentional.

## 2019-03-19 ENCOUNTER — Encounter: Payer: Self-pay | Admitting: Vascular Surgery

## 2019-03-19 LAB — GLUCOSE, CAPILLARY
Glucose-Capillary: 235 mg/dL — ABNORMAL HIGH (ref 70–99)
Glucose-Capillary: 196 mg/dL — ABNORMAL HIGH (ref 70–99)

## 2019-03-19 LAB — SARS CORONAVIRUS 2 BY RT PCR (HOSPITAL ORDER, PERFORMED IN ~~LOC~~ HOSPITAL LAB): SARS Coronavirus 2: NEGATIVE

## 2019-03-19 MED ORDER — CLONAZEPAM 0.25 MG PO TBDP: 0.2500 mg | ORAL_TABLET | Freq: Two times a day (BID) | ORAL | 0 refills | Status: AC | PRN

## 2019-03-19 MED ORDER — ASPIRIN 81 MG PO CHEW: 81.0000 mg | CHEWABLE_TABLET | Freq: Every day | ORAL | 0 refills | Status: DC

## 2019-03-19 MED ORDER — OXYCODONE-ACETAMINOPHEN 5-325 MG PO TABS: 1.0000 | ORAL_TABLET | Freq: Four times a day (QID) | ORAL | 0 refills | Status: DC | PRN

## 2019-03-19 MED ORDER — ATORVASTATIN CALCIUM 40 MG PO TABS: 40.0000 mg | ORAL_TABLET | Freq: Every day | ORAL | Status: AC

## 2019-03-19 MED ORDER — HYDRALAZINE HCL 25 MG PO TABS: 25.0000 mg | ORAL_TABLET | Freq: Three times a day (TID) | ORAL | Status: DC

## 2019-03-19 MED ORDER — INSULIN GLARGINE 100 UNIT/ML ~~LOC~~ SOLN: 40.0000 [IU] | Freq: Every day | SUBCUTANEOUS | 0 refills | Status: DC

## 2019-03-19 MED ORDER — INSULIN GLARGINE 100 UNIT/ML ~~LOC~~ SOLN
40.0000 [IU] | Freq: Every day | SUBCUTANEOUS | Status: DC
  Filled 2019-03-19: qty 0.4

## 2019-03-19 NOTE — Progress Notes (Signed)
Post HD Assessment   03/19/19 1400  Neurological  Level of Consciousness Alert  Orientation Level Oriented X4  Respiratory  Respiratory Pattern Regular;Dyspnea with exertion  Chest Assessment Chest expansion symmetrical  Bilateral Breath Sounds Diminished;Clear  Cardiac  Pulse Regular  Heart Sounds S1, S2  ECG Monitor Yes  Cardiac Rhythm SB  Vascular  R Radial Pulse +2  L Radial Pulse +2  Integumentary  Integumentary (WDL) X  Skin Color Appropriate for ethnicity  Skin Condition Dry;Itching  Skin Integrity Abrasion  Ecchymosis Location Abdomen;Buttocks  Ecchymosis Location Orientation Right;Left  Musculoskeletal  Musculoskeletal (WDL) X  Generalized Weakness Yes  Gastrointestinal  Bowel Sounds Assessment Active  GU Assessment  Genitourinary (WDL) X  Genitourinary Symptoms  (External cath and HD pt new start)  Urine Characteristics  Urine Color Yellow/straw  Urine Appearance Clear  Psychosocial  Psychosocial (WDL) WDL

## 2019-03-19 NOTE — TOC Progression Note (Addendum)
Transition of Care [facility]) - Progression Note    Patient Details  Name: Carolyn Valdez MRN: [id number] Date of Birth: 19-Apr-1941  Transition of Care [facility]) CM/SW Contact  Elza Rafter, RN Phone Number: 03/19/2019, 1:23 PM  Clinical Narrative:   Patient will DC back to [facility] today after HD.  Called and spoke with Lattie Haw at [place] to confirm patient can return today.  She will call this RNCM back after she reads the notes faxed.  Will need a COVID screen prior to discharging.  HD chair time is at [facility] [address] at 1130am.  Will need to arrive early for paper work.  Notified [facility] as patient will have home health services.      Expected Discharge Plan: Assisted Living Barriers to Discharge: Continued Medical Work up  Expected Discharge Plan and Services Expected Discharge Plan: Assisted Living   Discharge Planning Services: CM Consult Post Acute Care Choice: Durable Medical Equipment Living arrangements for the past 2 months: [place] Expected Discharge Date: 03/19/19                         HH Arranged: RN, PT, OT [place]: [facility] ([facility]) Date [place]: 03/05/19 Time [place]: [place] Representative spoke with at [place]: [place] ([place]) Interventions    Readmission Risk Interventions Readmission Risk Prevention Plan 03/19/2019  Transportation Screening Complete  Medication Review Press photographer) Complete  PCP or Specialist appointment within 3-5 days of discharge Complete  HRI or [place] Complete  SW Recovery Care/Counseling Consult Not Complete  SW Consult Not Complete Comments na  [place] Not Applicable  Some recent data might be hidden

## 2019-03-19 NOTE — Progress Notes (Signed)
Pre HD Tx   Pt requested to be put in chair in order to prepare for outpatient dialysis transfer, however transport unable to do so. Pt c/o sever pain in buttocks from sitting in chair for 3 hours yesterday. Dr. Candiss Norse spoke w patient and will be speaking with care manager to resolve this issue prior to discharge. Pt voices that she will try to bring pillows to outpatient facility. Likely TTS 2nd shift, [place] [facility] after d/c.   03/19/19 1030  Hand-Off documentation  Report given to (Full Name) Trellis Paganini RN  Report received from (Full Name) Adrian Prince RN  Vital Signs  Temp 99 F (37.2 C)  Temp Source Oral  Resp 18  BP Location Left Arm  BP Method Automatic  Patient Position (if appropriate) Lying  Oxygen Therapy  SpO2 100 %  O2 Device Nasal Cannula  O2 Flow Rate (L/min) 3 L/min  Pain Assessment  Pain Scale 0-10  Pain Score 7  Pain Type Acute pain  Pain Location Buttocks  Pain Orientation Mid  Pain Descriptors / Indicators Aching (pt reports pain d/t sitting in chair yesterday 3 hours)  Dialysis Weight  Weight 115.2 kg  Type of Weight Pre-Dialysis  Time-Out for Hemodialysis  What Procedure? Hemodialysis  Pt Identifiers(min of two) First/Last Name;MRN/Account#  Correct Site? Yes  Correct Side? Yes  Correct Procedure? Yes  Consents Verified? Yes  Rad Studies Available? N/A  Safety Precautions Reviewed? Yes  Engineer, civil (consulting) Number 7  Station Number 2  UF/Alarm Test Passed  Conductivity: Meter 14  Conductivity: Machine  14  pH 7.4  Reverse Osmosis Main  Normal Saline Lot Number [id number]  Dialyzer Lot Number [id number]  Disposable Set Lot Number [id number]  Machine Temperature 98.6 F (37 C)  Musician and Audible Yes  Blood Lines Intact and Secured Yes  Pre Treatment Patient Checks  Vascular access used during treatment Catheter  Hepatitis B Surface Antigen Results Negative  Date Hepatitis B Surface Antigen Drawn 03/09/19   Hepatitis B Surface Antibody  (<10)  Date Hepatitis B Surface Antibody Drawn 03/09/19  Hemodialysis Consent Verified Yes  Hemodialysis Standing Orders Initiated Yes  ECG (Telemetry) Monitor On Yes  Prime Ordered Normal Saline  Length of  DialysisTreatment -hour(s) 3 Hour(s)  Dialysis Treatment Comments Na 140  Dialyzer Elisio 17H NR  Dialysate Flow Ordered 600  Blood Flow Rate Ordered 400 mL/min  Ultrafiltration Goal 1 Liters  Dialysis Blood Pressure Support Ordered Normal Saline  Education / Care Plan  Dialysis Education Provided Yes  Documented Education in Care Plan Yes  Hemodialysis Catheter Right Internal jugular  Placement Date/Time: 03/16/19 1423   Time Out: Correct patient;Correct site;Correct procedure  Maximum sterile barrier precautions: Hand hygiene;Sterile gloves;Cap;Large sterile sheet;Mask;Sterile gown  Site Prep: Skin Prep Completely Dry at the Time ...  Site Condition No complications  Blue Lumen Status Capped (Central line)  Red Lumen Status Capped (Central line)  Purple Lumen Status N/A  Dressing Type Gauze/Drain sponge;Occlusive  Dressing Status Clean;Dry;Intact  Interventions New dressing;Dressing changed  Drainage Description None  Dressing Change Due 03/26/19

## 2019-03-19 NOTE — Progress Notes (Signed)
[facility]  ROUNDING NOTE   Subjective:   Patient seen during dialysis Tolerating well    HEMODIALYSIS FLOWSHEET:  Blood Flow Rate (mL/min): 275 mL/min Arterial Pressure (mmHg): -250 mmHg Venous Pressure (mmHg): 160 mmHg Transmembrane Pressure (mmHg): 60 mmHg Ultrafiltration Rate (mL/min): 620 mL/min Dialysate Flow Rate (mL/min): 600 ml/min Conductivity: Machine : 13.9 Conductivity: Machine : 13.9 Dialysis Fluid Bolus: Normal Saline Bolus Amount (mL): 250 mL    Objective:  Vital signs in last 24 hours:  Temp:  [98 F (36.7 C)-99 F (37.2 C)] 99 F (37.2 C) (06/01 1030) Pulse Rate:  [55-67] 61 (06/01 1400) Resp:  [10-22] 17 (06/01 1400) BP: (111-170)/(46-61) 128/61 (06/01 1400) SpO2:  [98 %-100 %] 100 % (06/01 1400) Weight:  [115.2 kg] 115.2 kg (06/01 1030)  Weight change: -0.363 kg Filed Weights   03/18/19 0521 03/19/19 0533 03/19/19 1030  Weight: 115.6 kg 115.2 kg 115.2 kg    Intake/Output: I/O last 3 completed shifts: In: -  Out: 850 [Urine:850]   Intake/Output this shift:  Total I/O In: 120 [P.O.:120] Out: -   Physical Exam: General: NAD, laying in bed  Head: Normocephalic, atraumatic. Moist oral mucosal membranes  Eyes: Anicteric, PERRL  Neck: No JVD  Lungs:  Clear to auscultation, 2 L [place]  Heart: Regular rate and rhythm  Abdomen:  Soft, nontender, obese  Extremities:  1+ nonpitting peripheral edema.  Neurologic: Nonfocal, moving all four extremities  Skin: No lesions  Access: RIJ permcath 5/29 Dr. Lucky Cowboy    Basic Metabolic Panel: Recent Labs  Lab 03/13/19 0327 03/14/19 0331 03/15/19 0341 03/16/19 0329 03/17/19 0504  NA 134* 134* 134* 134* 137  K 4.8 4.8 4.8 5.4* 5.2*  CL 95* 93* 95* 93* 96*  CO2 32 32 30 31 32  GLUCOSE 187* 244* 225* 204* 239*  BUN 36* 29* 47* 55* 27*  CREATININE 2.33* 2.42* 2.99* 2.89* 1.97*  CALCIUM 8.9 9.2 9.3 9.7 9.0  MG  --  2.5* 2.6*  --   --   PHOS 3.0  --   --   --   --     Liver Function  Tests: Recent Labs  Lab 03/13/19 0327  ALBUMIN 3.2*   No results for input(s): LIPASE, AMYLASE in the last 168 hours. No results for input(s): AMMONIA in the last 168 hours.  CBC: Recent Labs  Lab 03/13/19 0327 03/14/19 0331 03/16/19 0329 03/17/19 0504  WBC 8.9 8.4 10.1 8.6  NEUTROABS  --  4.9  --  5.5  HGB 7.9* 8.3* 8.0* 8.3*  HCT 25.2* 26.7* 25.6* 26.7*  MCV 92.6 94.0 93.4 94.3  PLT 225 237 287 277    Cardiac Enzymes: No results for input(s): CKTOTAL, CKMB, CKMBINDEX, TROPONINI in the last 168 hours.  BNP: Invalid input(s): POCBNP  CBG: Recent Labs  Lab 03/18/19 0751 03/18/19 1138 03/18/19 1709 03/18/19 2202 03/19/19 0816  GLUCAP 208* 284* 389* 295* 235*    Microbiology: Results for orders placed or performed during the hospital encounter of 03/02/19  SARS Coronavirus 2 (CEPHEID - Performed in [facility] hospital lab), Hosp Order     Status: None   Collection Time: 03/02/19  6:35 AM  Result Value Ref Range Status   SARS Coronavirus 2 NEGATIVE NEGATIVE Final    Comment: (NOTE) If result is NEGATIVE SARS-CoV-2 target nucleic acids are NOT DETECTED. The SARS-CoV-2 RNA is generally detectable in upper and lower  respiratory specimens during the acute phase of infection. The lowest  concentration of SARS-CoV-2 viral  copies this assay can detect is 250  copies / mL. A negative result does not preclude SARS-CoV-2 infection  and should not be used as the sole basis for treatment or other  patient management decisions.  A negative result may occur with  improper specimen collection / handling, submission of specimen other  than nasopharyngeal swab, presence of viral mutation(s) within the  areas targeted by this assay, and inadequate number of viral copies  (<250 copies / mL). A negative result must be combined with clinical  observations, patient history, and epidemiological information. If result is POSITIVE SARS-CoV-2 target nucleic acids are DETECTED. The  SARS-CoV-2 RNA is generally detectable in upper and lower  respiratory specimens dur ing the acute phase of infection.  Positive  results are indicative of active infection with SARS-CoV-2.  Clinical  correlation with patient history and other diagnostic information is  necessary to determine patient infection status.  Positive results do  not rule out bacterial infection or co-infection with other viruses. If result is PRESUMPTIVE POSTIVE SARS-CoV-2 nucleic acids MAY BE PRESENT.   A presumptive positive result was obtained on the submitted specimen  and confirmed on repeat testing.  While 2019 novel coronavirus  (SARS-CoV-2) nucleic acids may be present in the submitted sample  additional confirmatory testing may be necessary for epidemiological  and / or clinical management purposes  to differentiate between  SARS-CoV-2 and other Sarbecovirus currently known to infect humans.  If clinically indicated additional testing with an alternate test  methodology [phone]) is advised. The SARS-CoV-2 RNA is generally  detectable in upper and lower respiratory sp ecimens during the acute  phase of infection. The expected result is Negative. Fact Sheet for Patients:  StrictlyIdeas.no Fact Sheet for Healthcare Providers: BankingDealers.co.za This test is not yet approved or cleared by the [place] FDA and has been authorized for detection and/or diagnosis of SARS-CoV-2 by FDA under an Emergency Use Authorization (EUA).  This EUA will remain in effect (meaning this test can be used) for the duration of the COVID-19 declaration under Section 564(b)(1) of the Act, 21 U.S.C. section 360bbb-3(b)(1), unless the authorization is terminated or revoked sooner. Performed at [facility], [address] [place], [place] [address]   MRSA PCR Screening     Status: None   Collection Time: 03/06/19  8:15 PM  Result Value Ref Range Status   MRSA  by PCR NEGATIVE NEGATIVE Final    Comment:        The GeneXpert MRSA Assay (FDA approved for NASAL specimens only), is one component of a comprehensive MRSA colonization surveillance program. It is not intended to diagnose MRSA infection nor to guide or monitor treatment for MRSA infections. Performed at [facility], [address] [place], [place] [address]   Novel Coronavirus, NAA (hospital order; send-out to ref lab)     Status: None   Collection Time: 03/08/19  6:34 PM  Result Value Ref Range Status   SARS-CoV-2, NAA NOT DETECTED NOT DETECTED Final    Comment: (NOTE) This test was developed and its performance characteristics determined by [employer]. This test has not been FDA cleared or approved. This test has been authorized by FDA under an Emergency Use Authorization (EUA). This test is only authorized for the duration of time the declaration that circumstances exist justifying the authorization of the emergency use of in vitro diagnostic tests for detection of SARS-CoV-2 virus and/or diagnosis of COVID-19 infection under section 564(b)(1) of the Act, 21 U.S.C. 761YWV-3(X)(1),  unless the authorization is terminated or revoked sooner. When diagnostic testing is negative, the possibility of a false negative result should be considered in the context of a patient's recent exposures and the presence of clinical signs and symptoms consistent with COVID-19. An individual without symptoms of COVID-19 and who is not shedding SARS-CoV-2 virus would expect to have a negative (not detected) result in this assay. Performed  At: [facility] [place], [place] [id number] Rush Farmer MD HG:[id number]    [place]  Final    Comment: Performed at [facility], [address] [place], [place] [address]    Coagulation Studies: No results for input(s): LABPROT, INR in the last 72 hours.  Urinalysis: No  results for input(s): COLORURINE, LABSPEC, PHURINE, GLUCOSEU, HGBUR, BILIRUBINUR, KETONESUR, PROTEINUR, UROBILINOGEN, NITRITE, LEUKOCYTESUR in the last 72 hours.  Invalid input(s): APPERANCEUR    Imaging: No results found.   Medications:   . sodium chloride Stopped (03/11/19 1725)   . aspirin  81 mg Oral Daily  . atorvastatin  40 mg Oral q1800  . Chlorhexidine Gluconate Cloth  6 each Topical Q0600  . citalopram  10 mg Oral Daily  . docusate sodium  100 mg Oral BID  . epoetin (EPOGEN/PROCRIT) injection  10,000 Units Intravenous Q M,W,F-HD  . feeding supplement (ENSURE ENLIVE)  237 mL Oral BID BM  . fluticasone  2 spray Each Nare Daily  . heparin  5,000 Units Subcutaneous Q8H  . hydrALAZINE  25 mg Oral TID WC  . insulin aspart  0-5 Units Subcutaneous QHS  . insulin aspart  0-9 Units Subcutaneous TID WC  . insulin aspart  10 Units Subcutaneous TID WC  . insulin glargine  40 Units Subcutaneous Daily  . lamoTRIgine  25 mg Oral BID  . levETIRAcetam  250 mg Oral BID  . loratadine  10 mg Oral Daily  . Melatonin  5 mg Oral QHS  . metoprolol tartrate  25 mg Oral BID  . multivitamin  1 tablet Oral QHS  . nystatin   Topical BID  . OLANZapine  10 mg Oral QHS  . pantoprazole  40 mg Oral BID  . polyethylene glycol  17 g Oral Daily  . senna-docusate  1 tablet Oral QHS  . sodium chloride flush  3 mL Intravenous Q12H  . vitamin B-12  1,000 mcg Oral Daily   sodium chloride, acetaminophen, albuterol, alum & mag hydroxide-simeth, clonazePAM, diphenhydrAMINE, docusate sodium, hydrocortisone cream, HYDROmorphone (DILAUDID) injection, ondansetron (ZOFRAN) IV, ondansetron, oxyCODONE-acetaminophen, promethazine  Assessment/ Plan:  [name] is a 78 y.o. white female with congestive heart failure, hypertension, diabetes mellitus type II, obstructive sleep apnea, nephrolithiasis   1. Acute renal failure on chronic kidney disease stage IV: baseline creatinine of 2.69, GFR of 17 on 09/28/18.  First dialysis was 5/22.  Last hemodialysis treatment was yesterday 5/29.  Chronic kidney disease secondary to diabetic nephropathy Acute renal failure secondary to acute cardiorenal syndrome.  Tunneled catheter placed Outpatient planning for [place]   2. Anemia of chronic kidney disease: hemoglobin 8.3 EPO with HD treatment .   3. Secondary Hyperparathyroidism: PTH low at 51.  Lab Results  Component Value Date   CALCIUM 9.0 03/17/2019   PHOS 3.0 03/13/2019     4. 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 With proteinuria. History of poor control.  Lab Results  Component Value Date   HGBA1C 7.6 (H) 04/22/2018     5. Hypertension with  diastolic dysfunction congestive heart failure, pulmonary hypertension. Furosemide gtt with limited success, therefore transitioned to intermittent hemodialysis treatments.  Volume status has improved significantly.  Currently on hydralazine.    LOS: [place] 6/1/20202:46 PM

## 2019-03-19 NOTE — Progress Notes (Signed)
Report called to Lisa

## 2019-03-19 NOTE — Care Management Important Message (Signed)
Important Message  Patient Details  Name: NAHOMY LIMBURG MRN: [id number] Date of Birth: 07/14/1941   Medicare Important Message Given:  Yes    Dannette Barbara 03/19/2019, 11:26 AM

## 2019-03-19 NOTE — Progress Notes (Signed)
Post HD Tx  1 liter removed, tolerated tx well.   Catheter dysfunction throughout tx, likely d/t CVC just having been placed / some swelling of vessel d/t surgical trauma. MD notified, will continue to monitor this.     03/19/19 1400  Hand-Off documentation  Report given to (Full Name) Adrian Prince RN  Report received from (Full Name) Trellis Paganini RN  Vital Signs  Temp 98.2 F (36.8 C)  Temp Source Oral  Pulse Rate 61  Pulse Rate Source Monitor  Resp 17  BP 128/61  BP Location Left Arm  BP Method Automatic  Patient Position (if appropriate) Lying  Oxygen Therapy  SpO2 100 %  O2 Device Nasal Cannula  O2 Flow Rate (L/min) 3 L/min  Pain Assessment  Pain Scale 0-10  Pain Score 7  Dialysis Weight  Weight 114.3 kg  Type of Weight Post-Dialysis  Post-Hemodialysis Assessment  Rinseback Volume (mL) 250 mL  Dialyzer Clearance Lightly streaked  Duration of HD Treatment -hour(s) 3 hour(s)  Hemodialysis Intake (mL) 500 mL  UF Total -Machine (mL) 1500 mL  Net UF (mL) 1000 mL  Tolerated HD Treatment Yes  Hemodialysis Catheter Right Internal jugular  Placement Date/Time: 03/16/19 1423   Time Out: Correct patient;Correct site;Correct procedure  Maximum sterile barrier precautions: Hand hygiene;Sterile gloves;Cap;Large sterile sheet;Mask;Sterile gown  Site Prep: Skin Prep Completely Dry at the Time ...  Site Condition No complications  Blue Lumen Status Flushed;Capped (Central line);Heparin locked  Red Lumen Status Flushed;Capped (Central line);Heparin locked  Purple Lumen Status N/A  Catheter fill solution Heparin 1000 units/ml  Catheter fill volume (Arterial) 2 cc  Catheter fill volume (Venous) 2.2  Dressing Type Biopatch;Other (Comment) (tegaderm)  Dressing Status Clean;Dry;Intact  Interventions New dressing;Dressing changed  Drainage Description None  Dressing Change Due 03/26/19  Post treatment catheter status Capped and Clamped

## 2019-03-19 NOTE — Discharge Summary (Signed)
[place] at [facility], 78 y.o., DOB [birth year], MRN [id number]. Admission date: 03/02/2019 Discharge Date 03/19/2019 Primary MD Housecalls, Doctors Making Admitting Physician Vaughan Basta, MD  Admission Diagnosis  CHF (congestive heart failure) ([place]) [I50.9] Elevated troponin [R79.89] Acute on chronic congestive heart failure, unspecified heart failure type ([place]) [I50.9]  Discharge Diagnosis   Principal Problem: Acute on chronic systolic CHF Acute on chronic renal failure stage IV now end-stage renal disease dilated requiring dialysis Diabetes type 2 Hypertension Anemia of chronic kidney disease Hyperlipidemia Hyper kalemia Anxiety depression      Hospital Course Carolyn Valdez  is a 78 y.o. female with a known history of diastolic congestive heart failure, chronic kidney disease, breast cancer, chest pain, GI bleed, hypertension, hyperlipidemia-lives in [place], for last few days she has worsening shortness of breath so came to emergency room for further management.    Patient was admitted and aggressively diuresed however she continued to be very lethargic and renal function continued to be poor with a low GFR.  Was decided to start patient on hemodialysis.  Patient has received multiple hemodialysis here and has tolerated them well.  Patient currently stable for discharge.            Consults  cardiology, nephrology  Significant Tests:  See full reports for all details     Dg Chest 2 View  Result Date: 03/03/2019 CLINICAL DATA:  78 year old female with a known history of diastolic congestive heart failure. EXAM: CHEST - 2 VIEW COMPARISON:  Mar 02, 2019 FINDINGS: Stable cardiomegaly. The hila and mediastinum are normal. No pulmonary nodules, masses, focal infiltrates, or overt edema. IMPRESSION: Cardiomegaly.  No acute abnormalities. Electronically Signed   By: Dorise Bullion III M.D   On: 03/03/2019 08:42   Dg  Chest 2 View  Result Date: 03/02/2019 CLINICAL DATA:  78 year old female with shortness of breath for 2 days. Former smoker. EXAM: CHEST - 2 VIEW COMPARISON:  Portable chest 08/03/2018 and earlier. FINDINGS: Seated AP and lateral views of the chest. Stable cardiomegaly and mediastinal contours. Visualized tracheal air column is within normal limits. Stable lung volumes. No pneumothorax, pleural effusion or confluent pulmonary opacity. Calcified aortic atherosclerosis. Acute on chronic pulmonary interstitial opacity. No pleural fluid identified in the fissures on the lateral. Osteopenia.  Paucity of bowel gas in the upper abdomen. IMPRESSION: 1. Chronic cardiomegaly with acute on chronic pulmonary interstitial opacity. No pleural fluid identified. Consider pulmonary interstitial edema and viral/atypical respiratory infection. 2.  Aortic Atherosclerosis (ICD10-I70.0). Electronically Signed   By: Genevie Ann M.D.   On: 03/02/2019 06:57   US Renal  Result Date: 03/02/2019 CLINICAL DATA:  Acute renal failure EXAM: RENAL / URINARY TRACT ULTRASOUND COMPLETE COMPARISON:  Renal ultrasound 07/05/2018 FINDINGS: Right Kidney: Renal measurements: 10.8 x 6.6 x 6.5 cm = volume: 243 mL . Echogenicity within normal limits. No mass or hydronephrosis visualized. Left Kidney: Renal measurements: 10.8 x 6.7 x 6.7 cm = volume: 254 mL. Echogenicity within normal limits. No mass or hydronephrosis visualized. Bladder: Appears normal for degree of bladder distention. IMPRESSION: Negative renal ultrasound. Electronically Signed   By: Franchot Gallo M.D.   On: 03/02/2019 16:50       Today   Subjective:   Carolyn Valdez patient doing well breathing improved Objective:   Blood pressure (!) 132/46, pulse (!) 55, temperature 99 F (37.2 C), temperature source Oral, resp. rate 18, height 5\' 7"  (1.702 m), weight 115.2 kg, SpO2 100 %.  Marland Kitchen  Intake/Output Summary (Last 24 hours) at 03/19/2019 1203 Last data filed at 03/19/2019 1019 Gross per  24 hour  Intake 120 ml  Output 850 ml  Net -730 ml    Exam VITAL SIGNS: Blood pressure (!) 132/46, pulse (!) 55, temperature 99 F (37.2 C), temperature source Oral, resp. rate 18, height 5\' 7"  (1.702 m), weight 115.2 kg, SpO2 100 %.  GENERAL:  78 y.o.-year-old patient lying in the bed with no acute distress.  EYES: Pupils equal, round, reactive to light and accommodation. No scleral icterus. Extraocular muscles intact.  HEENT: Head atraumatic, normocephalic. Oropharynx and nasopharynx clear.  NECK:  Supple, no jugular venous distention. No thyroid enlargement, no tenderness.  LUNGS: Normal breath sounds bilaterally, no wheezing, rales,rhonchi or crepitation. No use of accessory muscles of respiration.  CARDIOVASCULAR: S1, S2 normal. No murmurs, rubs, or gallops.  ABDOMEN: Soft, nontender, nondistended. Bowel sounds present. No organomegaly or mass.  EXTREMITIES: No pedal edema, cyanosis, or clubbing.  NEUROLOGIC: Cranial nerves II through XII are intact. Muscle strength 5/5 in all extremities. Sensation intact. Gait not checked.  PSYCHIATRIC: The patient is alert and oriented x 3.  SKIN: No obvious rash, lesion, or ulcer.   Data Review     CBC w Diff:  Lab Results  Component Value Date   WBC 8.6 03/17/2019   HGB 8.3 (L) 03/17/2019   HGB 8.9 (L) 09/28/2018   HCT 26.7 (L) 03/17/2019   HCT 27.8 (L) 09/28/2018   PLT 277 03/17/2019   PLT 286 09/28/2018   LYMPHOPCT 17 03/17/2019   LYMPHOPCT 26.4 10/31/2014   MONOPCT 14 03/17/2019   MONOPCT 12.6 10/31/2014   EOSPCT 4 03/17/2019   EOSPCT 5.9 10/31/2014   BASOPCT 1 03/17/2019   BASOPCT 0.5 10/31/2014   CMP:  Lab Results  Component Value Date   NA 137 03/17/2019   NA 138 09/28/2018   NA 137 09/08/2014   K 5.2 (H) 03/17/2019   K 5.2 (H) 09/08/2014   CL 96 (L) 03/17/2019   CL 103 09/08/2014   CO2 32 03/17/2019   CO2 28 09/08/2014   BUN 27 (H) 03/17/2019   BUN 33 (H) 09/28/2018   BUN 16 09/08/2014   CREATININE 1.97 (H)  03/17/2019   CREATININE 1.45 (H) 09/08/2014   PROT 7.2 03/02/2019   PROT 7.1 09/28/2018   PROT 7.1 05/22/2014   ALBUMIN 3.2 (L) 03/13/2019   ALBUMIN 4.0 09/28/2018   ALBUMIN 3.2 (L) 05/22/2014   BILITOT 0.4 03/02/2019   BILITOT 0.3 09/28/2018   BILITOT 0.3 05/22/2014   ALKPHOS 100 03/02/2019   ALKPHOS 62 05/22/2014   AST 12 (L) 03/02/2019   AST 26 05/22/2014   ALT 8 03/02/2019   ALT 22 05/22/2014  .  Micro Results No results found for this or any previous visit (from the past 240 hour(s)).      Code Status Orders  (From admission, onward)         Start     Ordered   03/02/19 1211  Full code  Continuous     03/02/19 1210        Code Status History    Date Active Date Inactive Code Status Order ID Comments User Context   08/04/2018 0556 08/07/2018 1951 DNR [id number]  Harrie Foreman, MD Inpatient   07/06/2018 1407 07/14/2018 1603 DNR [id number]  Vaughan Basta, MD Inpatient   07/04/2018 2200 07/06/2018 1407 Full Code [id number]  Demetrios Loll, [place] Inpatient   04/22/2018 1702 04/26/2018 1851 DNR [id number]  Max Sane, MD Inpatient   02/21/2017 1629 02/24/2017 1747 DNR [id number]  Fritzi Mandes, MD Inpatient   01/31/2017 1617 02/04/2017 1350 DNR [id number]  Max Sane, MD Inpatient   01/23/2017 1320 01/31/2017 1617 Full Code [id number]  Henreitta Leber, MD Inpatient   10/05/2016 1216 10/26/2016 1740 DNR [id number]  Knox Royalty, NP Inpatient   09/24/2016 1731 10/05/2016 1216 Full Code [id number]  Gladstone Lighter, MD Inpatient   09/01/2016 0217 09/05/2016 1628 Full Code [id number]  [place], [place], DO Inpatient   08/09/2016 2027 08/12/2016 2015 Full Code [id number]  Henreitta Leber, MD Inpatient   09/30/2015 1423 10/07/2015 1737 Full Code [id number]  Loletha Grayer, MD ED   09/05/2015 1756 09/09/2015 1738 DNR [id number]  Loletha Grayer, MD ED    Advance Directive Documentation     Most Recent Value  Type of Advance Directive  Healthcare Power of Attorney, Living will   Pre-existing out of facility DNR order (yellow form or pink MOST form)  -  "MOST" Form in Place?  -          Follow-up Information    [place] Follow up on 03/23/2019.   Specialty:  Cardiology Why:  at 1:00pm Bring medications Contact information: [place]       [place], [place] Follow up in 1 week(s).   Specialty:  Geriatric Medicine Contact information: [place] [address] [phone]        Wellington Hampshire, MD .   Specialty:  Cardiology Contact information: [place] [address] [phone]           Discharge Medications   Allergies as of 03/19/2019      Reactions   Sulfa Antibiotics Hives   Biaxin [clarithromycin] Hives   Influenza A (h1n1) Monoval Vac Other (See Comments)   Pt states that she was told by her MD not to get the influenza vaccine.     Morphine Other (See Comments)   Reaction:  Dizziness and confusion    Pyridium [phenazopyridine Hcl] Other (See Comments)   Reaction:  Unknown    Ceftriaxone Anxiety   Latex Rash   Prednisone Rash   Tape Rash      Medication List    TAKE these medications   acetaminophen 325 MG tablet Commonly known as:  TYLENOL Take 2 tablets (650 mg total) by mouth every 6 (six) hours as needed for mild pain (or Fever >/= 101).   albuterol 108 (90 Base) MCG/ACT inhaler Commonly known as:  VENTOLIN HFA Inhale 2 puffs into the lungs every 6 (six) hours as needed for wheezing or shortness of breath.   aspirin 81 MG chewable tablet Chew 1 tablet (81 mg total) by mouth daily. Start taking on:  March 20, 2019   atorvastatin 40 MG tablet Commonly known as:  LIPITOR Take 1 tablet (40 mg total) by mouth daily at 6 PM.   citalopram 10 MG tablet Commonly known as:  CELEXA Take 1 tablet (10 mg total) by mouth daily.   clonazePAM  0.25 MG disintegrating tablet Commonly known as:  KLONOPIN Take 1 tablet (0.25 mg total) by mouth every 12 (twelve) hours as needed (anxiety).   docusate sodium 100 MG capsule Commonly known as:  COLACE Take 100 mg by mouth 2 (two) times daily.   fluticasone 50 MCG/ACT nasal spray Commonly known as:  FLONASE Place 2  sprays into both nostrils daily.   glipiZIDE 5 MG tablet Commonly known as:  GLUCOTROL Take 5 mg by mouth daily before breakfast.   hydrALAZINE 25 MG tablet Commonly known as:  APRESOLINE Take 1 tablet (25 mg total) by mouth 3 (three) times daily with meals. What changed:    medication strength  how much to take  when to take this   insulin aspart 100 UNIT/ML injection Commonly known as:  novoLOG 3 (three) times daily before meals. Inject per sliding scale 0-199=0, 200-500=5 units, notify MD for blood sugar> or equal to 500   insulin glargine 100 UNIT/ML injection Commonly known as:  LANTUS Inject 0.4 mLs (40 Units total) into the skin daily. What changed:  how much to take   iron polysaccharides 150 MG capsule Commonly known as:  NIFEREX Take 150 mg by mouth daily.   lamoTRIgine 25 MG tablet Commonly known as:  LAMICTAL Take 1 tablet (25 mg total) by mouth daily. What changed:  when to take this   levETIRAcetam 250 MG tablet Commonly known as:  KEPPRA Take 1 tablet (250 mg total) by mouth 2 (two) times daily.   loratadine 10 MG tablet Commonly known as:  CLARITIN Take 10 mg by mouth daily.   losartan 50 MG tablet Commonly known as:  COZAAR Take 50 mg by mouth daily.   Melatonin 3 MG Tabs Take 6 mg by mouth at bedtime.   metoprolol tartrate 50 MG tablet Commonly known as:  LOPRESSOR Take 50 mg by mouth 2 (two) times daily.   multivitamin tablet Take 1 tablet by mouth daily.   Myrbetriq 25 MG Tb24 tablet Generic drug:  mirabegron ER Take 25 mg by mouth daily.   nystatin powder Generic drug:  nystatin Apply topically. Apply to under  breast and abdominal once daily   OLANZapine 10 MG tablet Commonly known as:  ZYPREXA Take 10 mg by mouth at bedtime.   ondansetron 8 MG disintegrating tablet Commonly known as:  ZOFRAN-ODT Take 8 mg by mouth every 8 (eight) hours as needed for nausea/vomiting.   oxyCODONE-acetaminophen 5-325 MG tablet Commonly known as:  PERCOCET/ROXICET Take 1 tablet by mouth every 6 (six) hours as needed for moderate pain or severe pain.   pantoprazole 40 MG tablet Commonly known as:  PROTONIX Take 40 mg by mouth 2 (two) times daily.   polyethylene glycol 17 g packet Commonly known as:  MIRALAX / GLYCOLAX Take 17 g by mouth daily.   saccharomyces boulardii 250 MG capsule Commonly known as:  FLORASTOR Take 250 mg by mouth daily.   sennosides-docusate sodium 8.6-50 MG tablet Commonly known as:  SENOKOT-S Take 1 tablet by mouth at bedtime.   torsemide 20 MG tablet Commonly known as:  DEMADEX Take 2 tablets (40 mg total) by mouth daily. What changed:    how much to take  when to take this   vitamin B-12 1000 MCG tablet Commonly known as:  CYANOCOBALAMIN Take 1,000 mcg by mouth daily.          Total Time in preparing paper work, data evaluation and todays exam - 48 minutes  Dustin Flock M.D on 03/19/2019 at 12:03 PM [place]  [phone]

## 2019-03-19 NOTE — Progress Notes (Signed)
Pre HD Assessment    03/19/19 1040  Neurological  Level of Consciousness Alert  Orientation Level Oriented X4  Respiratory  Respiratory Pattern Regular;Dyspnea with exertion  Chest Assessment Chest expansion symmetrical  Bilateral Breath Sounds Clear;Diminished  Cardiac  Pulse Regular  Heart Sounds S1, S2  ECG Monitor Yes  Cardiac Rhythm SB  Vascular  R Radial Pulse +2  L Radial Pulse +2  Integumentary  Integumentary (WDL) X  Skin Color Appropriate for ethnicity  Skin Condition Dry;Itching  Skin Integrity Abrasion  Ecchymosis Location Abdomen;Buttocks  Ecchymosis Location Orientation Right;Left  Additional Integumentary Comments  (HD pt new start)  Musculoskeletal  Musculoskeletal (WDL) X  Generalized Weakness Yes  Gastrointestinal  Bowel Sounds Assessment Active  GU Assessment  Genitourinary (WDL) X  Genitourinary Symptoms  (External cath and HD pt new start)  Urine Characteristics  Urine Color Yellow/straw  Urine Appearance Clear  Psychosocial  Psychosocial (WDL) WDL

## 2019-03-19 NOTE — Progress Notes (Signed)
HD Tx Start  Arterial lumen bottoming out at tx start - line flushed 10cc NS, still unable to pull blood properly. Lines reversed, flow improved.     03/19/19 1045  Vital Signs  Pulse Rate (!) 59  Resp 16  BP (!) 141/58  Oxygen Therapy  SpO2 100 %  O2 Device Nasal Cannula  O2 Flow Rate (L/min) 3 L/min  During Hemodialysis Assessment  Blood Flow Rate (mL/min) 400 mL/min  Arterial Pressure (mmHg) -240 mmHg  Venous Pressure (mmHg) 120 mmHg  Transmembrane Pressure (mmHg) 60 mmHg  Ultrafiltration Rate (mL/min) 620 mL/min  Dialysate Flow Rate (mL/min) 600 ml/min  Conductivity: Machine  13.8  HD Safety Checks Performed Yes  Dialysis Fluid Bolus Normal Saline  Bolus Amount (mL) 250 mL  Intra-Hemodialysis Comments Tx initiated  Hemodialysis Catheter Right Internal jugular  Placement Date/Time: 03/16/19 1423   Time Out: Correct patient;Correct site;Correct procedure  Maximum sterile barrier precautions: Hand hygiene;Sterile gloves;Cap;Large sterile sheet;Mask;Sterile gown  Site Prep: Skin Prep Completely Dry at the Time ...  Site Condition No complications  Blue Lumen Status Infusing  Red Lumen Status Infusing

## 2019-03-19 NOTE — TOC Transition Note (Signed)
Transition of Care [facility]) - CM/SW Discharge Note   Patient Details  Name: Carolyn Valdez MRN: [id number] Date of Birth: [birth year]  [facility] CM/SW Contact:  Elza Rafter, RN Phone Number: 03/19/2019, 6:00 PM   Clinical Narrative:   Patient is discharging to [facility].  Room number 32.  RN aware and will call report to [phone].   Patient will need EMS transport to [facility] on [address]. On Saturday because [facility] doesn't transport on weekends and patient does not have any family that can transport.  Goal is to switch to MWF HD.  Estill Bamberg with [facility] aware.  Helene Kelp with [facility] is aware of discharge for home health services.      Final next level of care: Assisted Living Barriers to Discharge: No Barriers Identified   Patient Goals and CMS Choice Patient states their goals for this hospitalization and ongoing recovery are:: to get the nurses to answer when i hit the call bell      Discharge Placement                       Discharge Plan and Services   Discharge Planning Services: CM Consult Post Acute Care Choice: Durable Medical Equipment                    HH Arranged: RN, PT, OT [facility] Agency: [facility] ([facility]) Date [place]: 03/19/19 Time [place]: 1800 Representative spoke with at [place]: [place] ([place]) Interventions     Readmission Risk Interventions Readmission Risk Prevention Plan 03/19/2019  Transportation Screening Complete  Medication Review Press photographer) Complete  PCP or Specialist appointment within 3-5 days of discharge Complete  HRI or [place] Complete  SW Recovery Care/Counseling Consult Not Complete  SW Consult Not Complete Comments na  [place] Not Applicable  Some recent data might be hidden

## 2019-03-19 NOTE — Progress Notes (Addendum)
HD Tx End  1 liter removed, tolerated tx well.  Catheter dysfunction throughout tx, likely d/t CVC just having been placed / some swelling of vessel d/t surgical trauma. MD notified, will continue to monitor this.     03/19/19 1345  Vital Signs  Pulse Rate (!) 59  Resp 10  BP (!) 135/52  Oxygen Therapy  SpO2 98 %  During Hemodialysis Assessment  Blood Flow Rate (mL/min) 275 mL/min  Arterial Pressure (mmHg) -250 mmHg  Venous Pressure (mmHg) 160 mmHg  Transmembrane Pressure (mmHg) 60 mmHg  Ultrafiltration Rate (mL/min) 620 mL/min  Dialysate Flow Rate (mL/min) 600 ml/min  Conductivity: Machine  13.9  HD Safety Checks Performed Yes  Dialysis Fluid Bolus Normal Saline  Bolus Amount (mL) 250 mL  Intra-Hemodialysis Comments Progressing as prescribed

## 2019-03-23 ENCOUNTER — Ambulatory Visit: Payer: Medicare Other | Admitting: Family

## 2019-04-08 IMAGING — MG DIGITAL SCREENING UNILATERAL LEFT MAMMOGRAM WITH CAD AND TOMO
4 series · 4 of 12 positions shown · non-contrast
Comparison: Previous exam(s).

CLINICAL DATA: Screening.

EXAM:
DIGITAL SCREENING UNILATERAL LEFT MAMMOGRAM WITH CAD AND TOMO

[L MLO synth-2D]
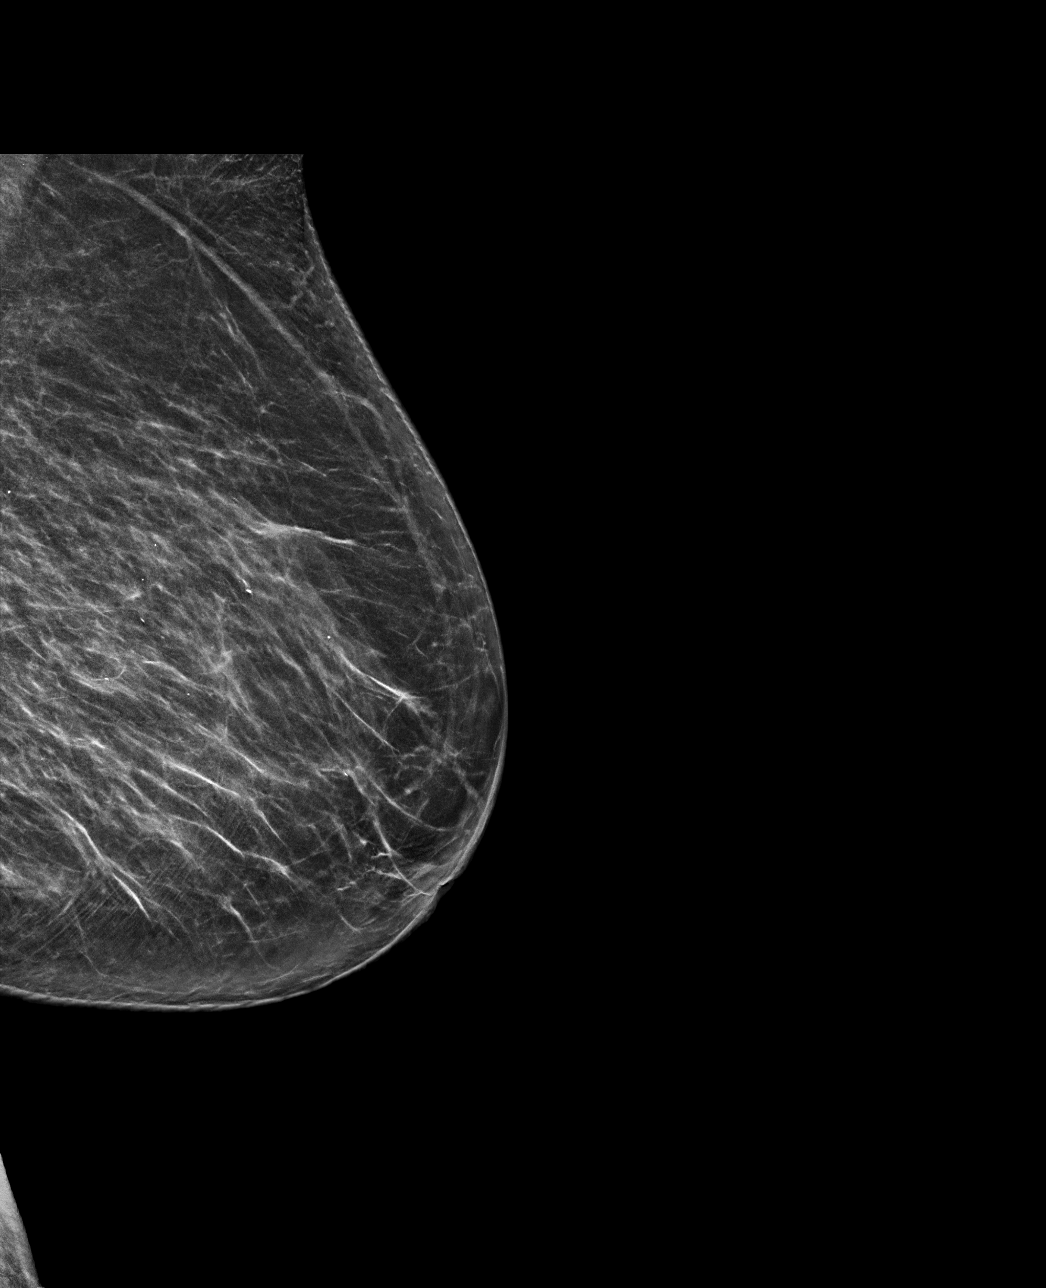

[L CC synth-2D]
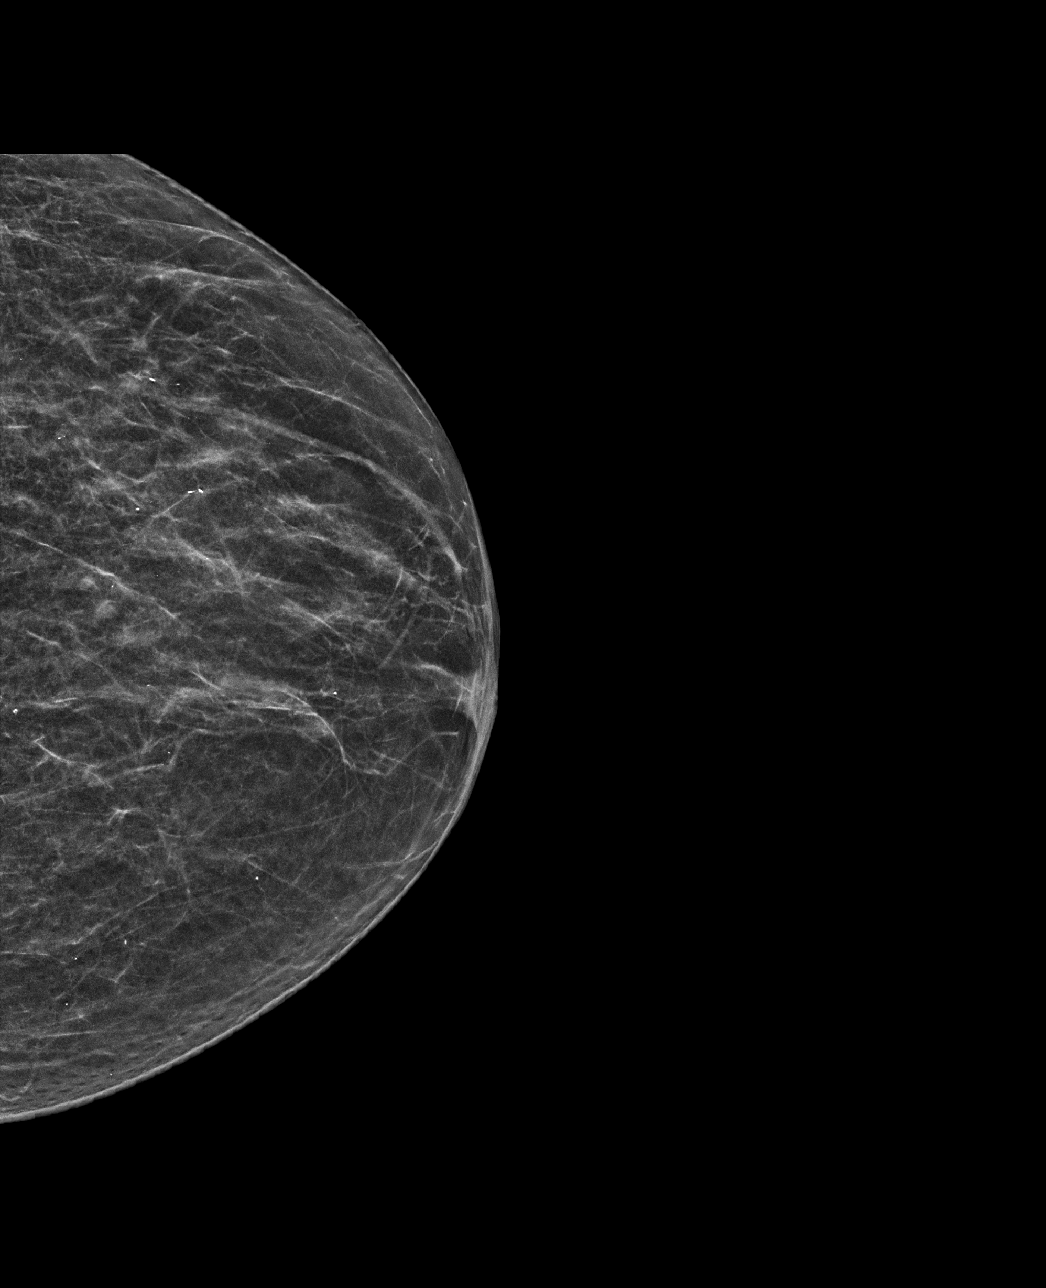

[L CC tomo · tomo slice 25/49.0]
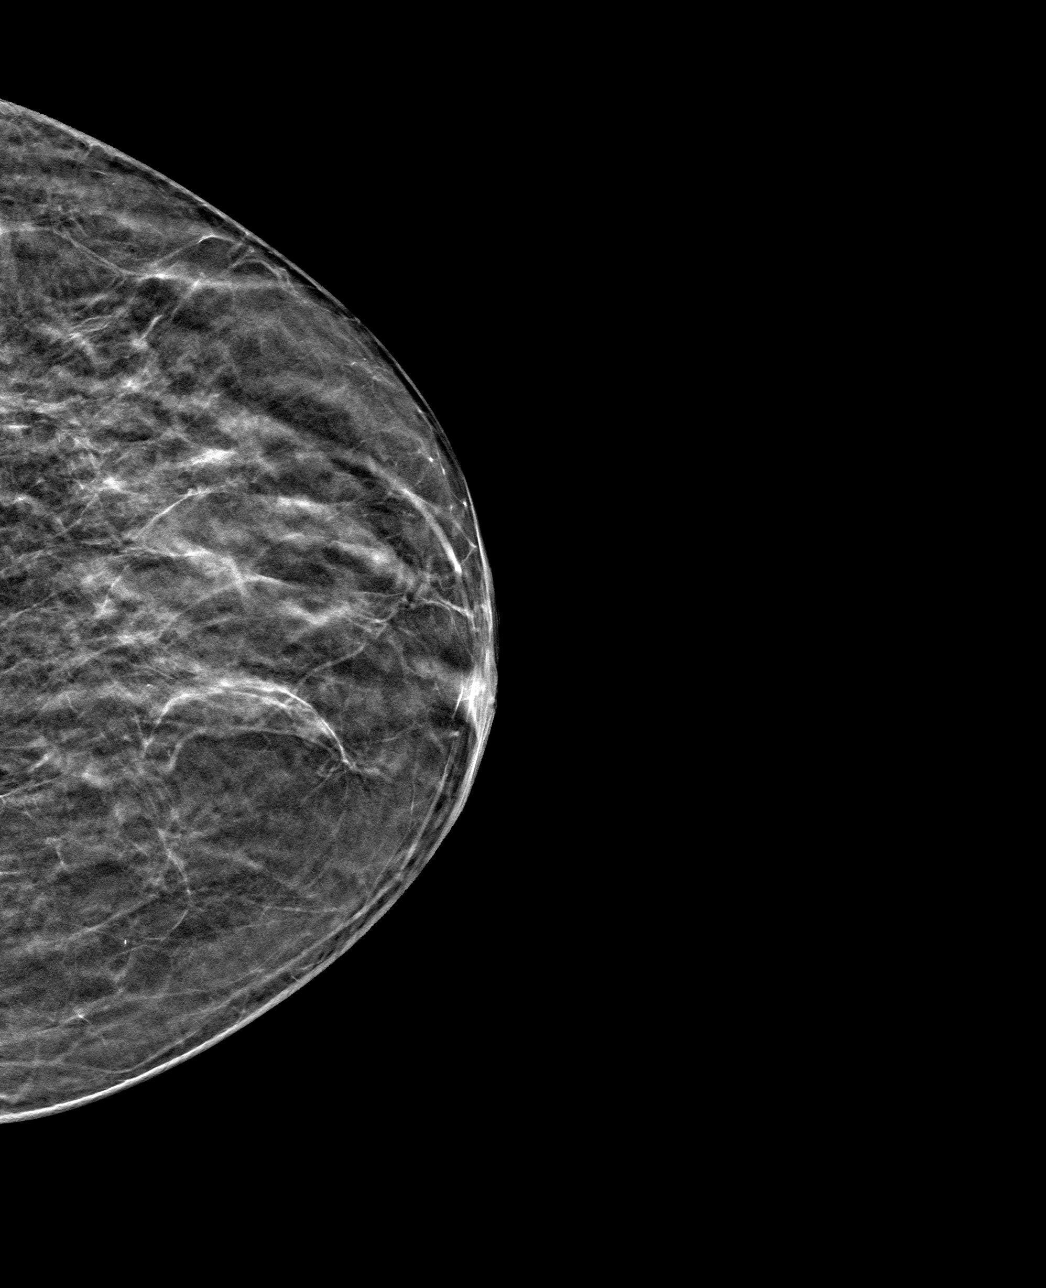

[L MLO tomo · tomo slice 33/64.0]
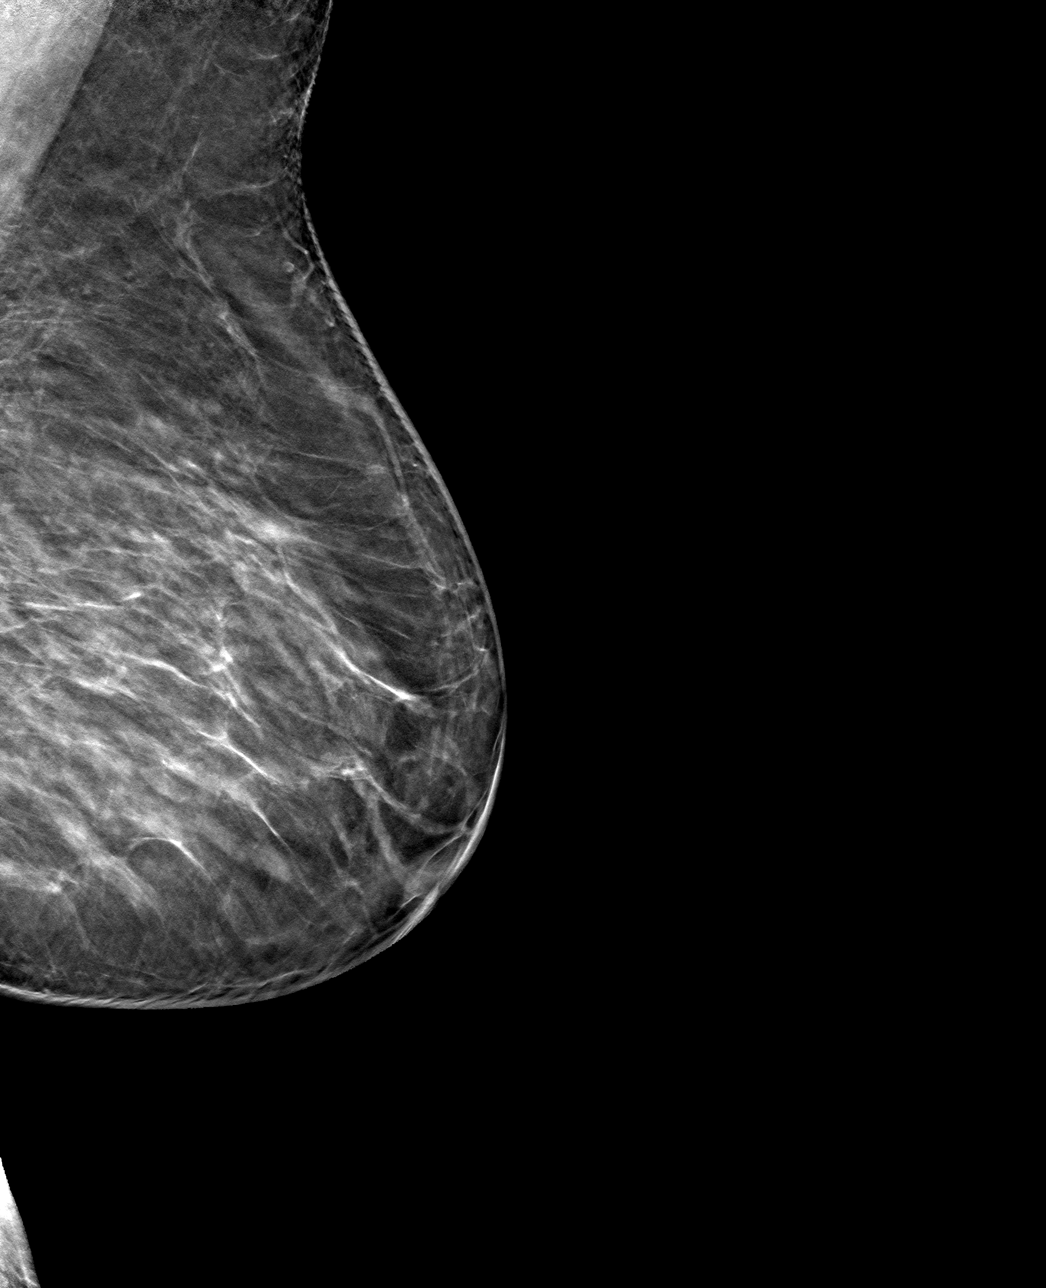

[4 of 12 positions shown; findings below may reference images not displayed]

ACR Breast Density Category c: The breast tissue is heterogeneously
dense, which may obscure small masses.
FINDINGS: The patient has had a right mastectomy. There are no findings
suspicious for malignancy.

Images were processed with CAD.
IMPRESSION: No mammographic evidence of malignancy. A result letter of this
screening mammogram will be mailed directly to the patient.

RECOMMENDATION:
Screening mammogram in one year.  (Code:SI-6-R6I)

BI-RADS CATEGORY  1: Negative.

## 2019-05-24 ENCOUNTER — Telehealth (INDEPENDENT_AMBULATORY_CARE_PROVIDER_SITE_OTHER): Payer: Self-pay

## 2019-05-24 NOTE — Telephone Encounter (Signed)
A fax was received for the patient to have a permcath exchange. The patient is scheduled with Dr. Delana Meyer for a procedure on 05/29/2019 with a 12:00 pm arrival time to the [facility]. Patient will do Covid testing on 05/29/2019 at 10:00 am at the [facility]. Patient resides at [facility]. Pre-procedure instructions will be faxed back to the patients dialysis center at [facility] on [address].

## 2019-05-25 ENCOUNTER — Other Ambulatory Visit: Payer: Medicare Other

## 2019-05-28 ENCOUNTER — Other Ambulatory Visit (INDEPENDENT_AMBULATORY_CARE_PROVIDER_SITE_OTHER): Payer: Self-pay | Admitting: Nurse Practitioner

## 2019-05-28 ENCOUNTER — Other Ambulatory Visit (INDEPENDENT_AMBULATORY_CARE_PROVIDER_SITE_OTHER): Payer: Self-pay | Admitting: Vascular Surgery

## 2019-05-28 DIAGNOSIS — N186 End stage renal disease: Secondary | ICD-10-CM

## 2019-05-28 NOTE — Telephone Encounter (Signed)
Received a call from [facility] regarding the patient and having her scheduled for a procedure with Dr. Delana Meyer. The patient is to arrive to the [facility] for Covid testing at 10:00 the morning of 05/29/2019 then go to the [facility] at 12:00 pm for her procedure.  This information was given.

## 2019-05-29 ENCOUNTER — Ambulatory Visit
Admission: RE | Admit: 2019-05-29 | Discharge: 2019-05-29 | Disposition: A | Discharge status: Skilled Nursing Facility | Payer: Medicare Other | Attending: Vascular Surgery | Admitting: Vascular Surgery

## 2019-05-29 ENCOUNTER — Encounter
Admission: RE | Disposition: A | Discharge status: Skilled Nursing Facility | Payer: Self-pay | Source: Home / Self Care | Attending: Vascular Surgery

## 2019-05-29 ENCOUNTER — Ambulatory Visit (INDEPENDENT_AMBULATORY_CARE_PROVIDER_SITE_OTHER): Payer: Medicare Other

## 2019-05-29 ENCOUNTER — Other Ambulatory Visit
Admission: RE | Admit: 2019-05-29 | Discharge: 2019-05-29 | Disposition: A | Discharge status: Home / Self Care | Payer: Medicare Other | Source: Ambulatory Visit | Attending: Vascular Surgery | Admitting: Vascular Surgery

## 2019-05-29 ENCOUNTER — Encounter: Payer: Self-pay | Admitting: *Deleted

## 2019-05-29 ENCOUNTER — Encounter (INDEPENDENT_AMBULATORY_CARE_PROVIDER_SITE_OTHER): Payer: Medicare Other | Admitting: Vascular Surgery

## 2019-05-29 DIAGNOSIS — Z888 Allergy status to other drugs, medicaments and biological substances status: Secondary | ICD-10-CM | POA: Insufficient documentation

## 2019-05-29 DIAGNOSIS — E1122 Type 2 diabetes mellitus with diabetic chronic kidney disease: Secondary | ICD-10-CM | POA: Insufficient documentation

## 2019-05-29 DIAGNOSIS — Y841 Kidney dialysis as the cause of abnormal reaction of the patient, or of later complication, without mention of misadventure at the time of the procedure: Secondary | ICD-10-CM | POA: Insufficient documentation

## 2019-05-29 DIAGNOSIS — Z992 Dependence on renal dialysis: Secondary | ICD-10-CM

## 2019-05-29 DIAGNOSIS — Z1159 Encounter for screening for other viral diseases: Secondary | ICD-10-CM | POA: Diagnosis not present

## 2019-05-29 DIAGNOSIS — I2510 Atherosclerotic heart disease of native coronary artery without angina pectoris: Secondary | ICD-10-CM | POA: Insufficient documentation

## 2019-05-29 DIAGNOSIS — Z882 Allergy status to sulfonamides status: Secondary | ICD-10-CM | POA: Insufficient documentation

## 2019-05-29 DIAGNOSIS — Z885 Allergy status to narcotic agent status: Secondary | ICD-10-CM | POA: Insufficient documentation

## 2019-05-29 DIAGNOSIS — I120 Hypertensive chronic kidney disease with stage 5 chronic kidney disease or end stage renal disease: Secondary | ICD-10-CM

## 2019-05-29 DIAGNOSIS — T82868A Thrombosis of vascular prosthetic devices, implants and grafts, initial encounter: Secondary | ICD-10-CM

## 2019-05-29 DIAGNOSIS — N186 End stage renal disease: Secondary | ICD-10-CM | POA: Insufficient documentation

## 2019-05-29 DIAGNOSIS — Z87891 Personal history of nicotine dependence: Secondary | ICD-10-CM | POA: Insufficient documentation

## 2019-05-29 HISTORY — PX: DIALYSIS/PERMA CATHETER INSERTION: CATH118288

## 2019-05-29 LAB — GLUCOSE, CAPILLARY: Glucose-Capillary: 168 mg/dL — ABNORMAL HIGH (ref 70–99)

## 2019-05-29 LAB — SARS CORONAVIRUS 2 BY RT PCR (HOSPITAL ORDER, PERFORMED IN ~~LOC~~ HOSPITAL LAB): SARS Coronavirus 2: NEGATIVE

## 2019-05-29 SURGERY — DIALYSIS/PERMA CATHETER INSERTION
Anesthesia: Moderate Sedation

## 2019-05-29 MED ORDER — CLINDAMYCIN PHOSPHATE 300 MG/50ML IV SOLN
300.0000 mg | Freq: Once | INTRAVENOUS | Status: AC
  Administered 2019-05-29: 300 mg via INTRAVENOUS

## 2019-05-29 MED ORDER — FENTANYL CITRATE (PF) 100 MCG/2ML IJ SOLN
INTRAMUSCULAR | Status: AC
  Filled 2019-05-29: qty 2

## 2019-05-29 MED ORDER — FENTANYL CITRATE (PF) 100 MCG/2ML IJ SOLN
INTRAMUSCULAR | Status: DC | PRN
  Administered 2019-05-29: 50 ug via INTRAVENOUS

## 2019-05-29 MED ORDER — HYDROMORPHONE HCL 1 MG/ML IJ SOLN: 1.0000 mg | Freq: Once | INTRAMUSCULAR | Status: DC | PRN

## 2019-05-29 MED ORDER — ONDANSETRON HCL 4 MG/2ML IJ SOLN: 4.0000 mg | Freq: Four times a day (QID) | INTRAMUSCULAR | Status: DC | PRN

## 2019-05-29 MED ORDER — MIDAZOLAM HCL 2 MG/2ML IJ SOLN
INTRAMUSCULAR | Status: DC | PRN
  Administered 2019-05-29: 2 mg via INTRAVENOUS

## 2019-05-29 MED ORDER — CLINDAMYCIN PHOSPHATE 300 MG/50ML IV SOLN
INTRAVENOUS | Status: AC
  Administered 2019-05-29: 300 mg via INTRAVENOUS
  Filled 2019-05-29: qty 50

## 2019-05-29 MED ORDER — HEPARIN SODIUM (PORCINE) 10000 UNIT/ML IJ SOLN
INTRAMUSCULAR | Status: AC
  Filled 2019-05-29: qty 1

## 2019-05-29 MED ORDER — MIDAZOLAM HCL 5 MG/5ML IJ SOLN
INTRAMUSCULAR | Status: AC
  Filled 2019-05-29: qty 5

## 2019-05-29 MED ORDER — SODIUM CHLORIDE 0.9 % IV SOLN
INTRAVENOUS | Status: DC
  Administered 2019-05-29: 13:00:00 via INTRAVENOUS

## 2019-05-29 MED ORDER — DIPHENHYDRAMINE HCL 50 MG/ML IJ SOLN: 50.0000 mg | Freq: Once | INTRAMUSCULAR | Status: DC | PRN

## 2019-05-29 MED ORDER — METHYLPREDNISOLONE SODIUM SUCC 125 MG IJ SOLR: 125.0000 mg | Freq: Once | INTRAMUSCULAR | Status: DC | PRN

## 2019-05-29 MED ORDER — MIDAZOLAM HCL 2 MG/ML PO SYRP: 8.0000 mg | ORAL_SOLUTION | Freq: Once | ORAL | Status: DC | PRN

## 2019-05-29 MED ORDER — LIDOCAINE-EPINEPHRINE (PF) 1 %-1:200000 IJ SOLN
INTRAMUSCULAR | Status: AC
  Filled 2019-05-29: qty 30

## 2019-05-29 MED ORDER — FAMOTIDINE 20 MG PO TABS: 40.0000 mg | ORAL_TABLET | Freq: Once | ORAL | Status: DC | PRN

## 2019-05-29 SURGICAL SUPPLY — 9 items
CATH PALIN MAXID VT KIT 19CM (CATHETERS) ×3 IMPLANT
DERMABOND ADVANCED (GAUZE/BANDAGES/DRESSINGS) ×2
DERMABOND ADVANCED .7 DNX12 (GAUZE/BANDAGES/DRESSINGS) ×1 IMPLANT
GUIDEWIRE SUPER STIFF .035X180 (WIRE) ×3 IMPLANT
PACK ANGIOGRAPHY (CUSTOM PROCEDURE TRAY) ×3 IMPLANT
SUT MNCRL 4-0 (SUTURE) ×2
SUT MNCRL 4-0 27XMFL (SUTURE) ×1
SUT PROLENE 0 CT 1 30 (SUTURE) ×3 IMPLANT
SUTURE MNCRL 4-0 27XMF (SUTURE) ×1 IMPLANT

## 2019-05-29 NOTE — H&P (Signed)
[name] Admission History & Physical  MRN : [id number]  Carolyn Valdez is a 78 y.o. (06-03-1941) female who presents with chief complaint of No chief complaint on file. Marland Kitchen  History of Present Illness:  I am asked to evaluate the patient by the dialysis center. The patient was sent here because they were unable to achieve adequate dialysis yesterday. Furthermore the Center states they were unable to aspirate either lumen of the catheter yesterday.  This problem has been getting worse for about 1 week. The patient is unaware of any other change.   Patient denies pain or tenderness overlying the access.  There is no pain with dialysis.  Patient denies fevers or shaking chills while on dialysis.    There have multiple any past interventions and declots of his multiple different access.  The patient is not chronically hypotensive on dialysis.   No current facility-administered medications for this encounter.    Facility-Administered Medications Ordered in Other Encounters  Medication Dose Route Frequency Provider Last Rate Last Dose  . 0.9 %  sodium chloride infusion    Continuous PRN Dionne Bucy, CRNA         Past Surgical History:  Procedure Laterality Date  . CARDIAC CATHETERIZATION    . CHOLECYSTECTOMY    . COLONOSCOPY N/A 08/06/2018   Procedure: COLONOSCOPY;  Surgeon: Virgel Manifold, MD;  Location: [facility] ENDOSCOPY;  Service: Endoscopy;  Laterality: N/A;  . DIALYSIS/PERMA CATHETER INSERTION N/A 03/16/2019   Procedure: DIALYSIS/PERMA CATHETER INSERTION;  Surgeon: Algernon Huxley, MD;  Location: [facility];  Service: Cardiovascular;  Laterality: N/A;  . ESOPHAGOGASTRODUODENOSCOPY N/A 08/06/2018   Procedure: ESOPHAGOGASTRODUODENOSCOPY (EGD);  Surgeon: Virgel Manifold, MD;  Location: [facility]COPY;  Service: Endoscopy;  Laterality: N/A;  . FOOT SURGERY    . JOINT REPLACEMENT    . MASTECTOMY Right 1997  . NASAL SEPTUM SURGERY    . PERIPHERAL  VASCULAR CATHETERIZATION N/A 07/08/2015   Procedure: PICC Line Insertion;  Surgeon: Algernon Huxley, MD;  Location: [facility];  Service: Cardiovascular;  Laterality: N/A;  . TEMPORARY DIALYSIS CATHETER N/A 03/09/2019   Procedure: TEMPORARY DIALYSIS CATHETER INSERTION;  Surgeon: Algernon Huxley, MD;  Location: [facility];  Service: Cardiovascular;  Laterality: N/A;  . TONSILLECTOMY    . Vocal cords      Social History Social History   Tobacco Use  . Smoking status: Former Research scientist (life sciences)  . Smokeless tobacco: Former Systems developer    Quit date: 10/29/1995  Substance Use Topics  . Alcohol use: No    Alcohol/week: 0.0 standard drinks  . Drug use: No    Family History Family History  Problem Relation Age of Onset  . Lung cancer Father   . Hematuria Mother   . Lung cancer Mother   . Kidney disease Neg Hx   . Bladder Cancer Neg Hx   . Breast cancer Neg Hx     No family history of bleeding or clotting disorders, autoimmune disease or porphyria  Allergies  Allergen Reactions  . Sulfa Antibiotics Hives  . Biaxin [Clarithromycin] Hives  . Influenza A (H1n1) Monoval Vac Other (See Comments)    Pt states that she was told by her MD not to get the influenza vaccine.    . Morphine Other (See Comments)    Reaction:  Dizziness and confusion   . Pyridium [Phenazopyridine Hcl] Other (See Comments)    Reaction:  Unknown   . Ceftriaxone Anxiety  .  Latex Rash  . Prednisone Rash  . Tape Rash     REVIEW OF SYSTEMS (Negative unless checked)  Constitutional: [] Weight loss  [] Fever  [] Chills Cardiac: [] Chest pain   [] Chest pressure   [] Palpitations   [] Shortness of breath when laying flat   [] Shortness of breath at rest   [x] Shortness of breath with exertion. Vascular:  [] Pain in legs with walking   [] Pain in legs at rest   [] Pain in legs when laying flat   [] Claudication   [] Pain in feet when walking  [] Pain in feet at rest  [] Pain in feet when laying flat   [] History of DVT   [] Phlebitis    [] Swelling in legs   [] Varicose veins   [] Non-healing ulcers Pulmonary:   [] Uses home oxygen   [] Productive cough   [] Hemoptysis   [] Wheeze  [] COPD   [] Asthma Neurologic:  [] Dizziness  [] Blackouts   [] Seizures   [] History of stroke   [] History of TIA  [] Aphasia   [] Temporary blindness   [] Dysphagia   [] Weakness or numbness in arms   [] Weakness or numbness in legs Musculoskeletal:  [] Arthritis   [] Joint swelling   [] Joint pain   [] Low back pain Hematologic:  [] Easy bruising  [] Easy bleeding   [] Hypercoagulable state   [] Anemic  [] Hepatitis Gastrointestinal:  [] Blood in stool   [] Vomiting blood  [] Gastroesophageal reflux/heartburn   [] Difficulty swallowing. Genitourinary:  [x] Chronic kidney disease   [] Difficult urination  [] Frequent urination  [] Burning with urination   [] Blood in urine Skin:  [] Rashes   [] Ulcers   [] Wounds Psychological:  [] History of anxiety   []  History of major depression.  Physical Examination  There were no vitals filed for this visit. There is no height or weight on file to calculate BMI. Gen: WD/WN, NAD Head: [place]/AT, No temporalis wasting. Prominent temp pulse not noted. Ear/Nose/Throat: Hearing grossly intact, nares w/o erythema or drainage, oropharynx w/o Erythema/Exudate,  Eyes: Conjunctiva clear, sclera non-icteric Neck: Trachea midline.  No JVD.  Pulmonary:  Good air movement, respirations not labored, no use of accessory muscles.  Cardiac: RRR, normal S1, S2. Vascular: right IJ tunneled catheter without tenderness or drainage Vessel Right Left  Radial Palpable Palpable  Gastrointestinal: soft, non-tender/non-distended. No guarding/reflex.  Musculoskeletal: M/S 5/5 throughout.  Extremities without ischemic changes.  No deformity or atrophy.  Neurologic: Sensation grossly intact in extremities.  Symmetrical.  Speech is fluent. Motor exam as listed above. Psychiatric: Judgment intact, Mood & affect appropriate for pt's clinical situation. Dermatologic: No rashes  or ulcers noted.  No cellulitis or open wounds. Lymph : No Cervical, Axillary, or Inguinal lymphadenopathy.   CBC Lab Results  Component Value Date   WBC 8.6 03/17/2019   HGB 8.3 (L) 03/17/2019   HCT 26.7 (L) 03/17/2019   MCV 94.3 03/17/2019   PLT 277 03/17/2019    BMET    Component Value Date/Time   NA 137 03/17/2019 0504   NA 138 09/28/2018 1517   NA 137 09/08/2014 1042   K 5.2 (H) 03/17/2019 0504   K 5.2 (H) 09/08/2014 1042   CL 96 (L) 03/17/2019 0504   CL 103 09/08/2014 1042   CO2 32 03/17/2019 0504   CO2 28 09/08/2014 1042   GLUCOSE 239 (H) 03/17/2019 0504   GLUCOSE 211 (H) 09/08/2014 1042   BUN 27 (H) 03/17/2019 0504   BUN 33 (H) 09/28/2018 1517   BUN 16 09/08/2014 1042   CREATININE 1.97 (H) 03/17/2019 0504   CREATININE 1.45 (H) 09/08/2014 1042   CALCIUM  9.0 03/17/2019 0504   CALCIUM 8.3 (L) 09/08/2014 1042   GFRNONAA 24 (L) 03/17/2019 0504   GFRNONAA 38 (L) 09/08/2014 1042   GFRNONAA 45 (L) 07/09/2014 1631   GFRAA 28 (L) 03/17/2019 0504   GFRAA 46 (L) 09/08/2014 1042   GFRAA 52 (L) 07/09/2014 1631   CrCl cannot be calculated (Patient's most recent lab result is older than the maximum 21 days allowed.).  COAG Lab Results  Component Value Date   INR 1.1 09/28/2018   INR 1.56 07/05/2018   INR 1.2 08/23/2013    Radiology No results found.  Assessment/Plan 1.  Complication dialysis device with thrombosis AV access:  Patient's right IJ tunneled catheter is thrombosed. The patient will undergo exchange of the catheter same venous access using interventional techniques.  The risks and benefits were described to the patient.  All questions were answered.  The patient agrees to proceed with intervention.  2.  End-stage renal disease requiring hemodialysis:  Patient will continue dialysis therapy without further interruption if a successful exchange is not achieved then new site will be found for tunneled catheter placement. Dialysis has already been arranged  since the patient missed their previous session 3.  Hypertension:  Patient will continue medical management; nephrology is following no changes in oral medications. 4. Diabetes mellitus:  Glucose will be monitored and oral medications been held this morning once the patient has undergone the patient's procedure po intake will be reinitiated and again Accu-Cheks will be used to assess the blood glucose level and treat as needed. The patient will be restarted on the patient's usual hypoglycemic regime 5.  Coronary artery disease:  EKG will be monitored. Nitrates will be used if needed. The patient's oral cardiac medications will be continued.    Hortencia Pilar, MD  05/29/2019 10:30 AM

## 2019-05-29 NOTE — Op Note (Signed)
[facility]   OPERATIVE NOTE     PROCEDURE: 1. Exchange right IJ tunneled dialysis catheter over wire same access   PRE-OPERATIVE DIAGNOSIS: Complication of dialysis device with nonfunction of tunneled catheter; end-stage renal requiring hemodialysis  POST-OPERATIVE DIAGNOSIS: same as above  SURGEON: Katha Cabal, M.D.  ANESTHESIA: Conscious sedation was administered under my direct supervision by the interventional radiology RN.  IV Versed plus fentanyl were utilized. Continuous ECG, pulse oximetry and blood pressure was monitored throughout the entire procedure.  Conscious sedation was for a total of 25 minutes.  ESTIMATED BLOOD LOSS: Minimal  FINDING(S): 1.  Tips of the catheter in the right atrium on fluoroscopy 2.  No obvious pneumothorax on fluoroscopy  SPECIMEN(S):  none  INDICATIONS:   EMMANI LESUEUR is a 78 y.o. female  presents with end stage renal disease.  Therefore, the patient requires a tunneled dialysis catheter placement.  The patient is informed of  the risks catheter placement include but are not limited to: bleeding, infection, central venous injury, pneumothorax, possible venous stenosis, possible malpositioning in the venous system, and possible infections related to long-term catheter presence.  The patient was aware of these risks and agreed to proceed.  DESCRIPTION: The patient was taken back to Special Procedure suite.  Prior to sedation, the patient was given IV antibiotics.  After obtaining adequate sedation, the patient was prepped and draped in the standard fashion for a chest or neck tunneled dialysis catheter placement.  Appropriate Time Out is called.   The the right hand were almost neck and chest wall are then infiltrated with 1% Lidocaine with epinepherine.  A 19 cm tip to cuff palindrome catheter is then selected, opened on the back table and prepped.  The cuff of the existing catheter is localized.  Using both blunt and  sharp dissection the cuff is then freed from surrounding attachments. The catheter is then controlled with a hemostat and transected above the level of the cuff.  Under fluoroscopy an Amplatz Super Stiff wire is introduced through the catheter and negotiated into the inferior vena cava. The remaining portion of the catheter is then removed without difficulty.  A new catheter is then threaded over the wire and advanced under fluoroscopy and positioned so that the catheter tip is in the proximal atrium.  Each port was tested by aspirating and flushing.  No resistance was noted.  Each port was then thoroughly flushed with heparinized saline.  The catheter was secured in placed with two interrupted stitches of 0 silk tied to the catheter.   Each port was then packed with concentrated heparin (10,000 Units/mL) at the manufacturer recommended volumes to each port.  Sterile caps were applied to each port.  On completion fluoroscopy, the tips of the catheter were in the right atrium, and there was no evidence of pneumothorax.  COMPLICATIONS: None  CONDITION: Margaretmary Dys 05/29/2019,1:42 PM [facility] Office: [phone]   05/29/2019, 1:42 PM

## 2019-06-12 ENCOUNTER — Ambulatory Visit (INDEPENDENT_AMBULATORY_CARE_PROVIDER_SITE_OTHER): Payer: Medicare Other | Admitting: Vascular Surgery

## 2019-06-12 ENCOUNTER — Other Ambulatory Visit (INDEPENDENT_AMBULATORY_CARE_PROVIDER_SITE_OTHER): Payer: Medicare Other

## 2019-06-12 ENCOUNTER — Encounter (INDEPENDENT_AMBULATORY_CARE_PROVIDER_SITE_OTHER): Payer: Medicare Other

## 2019-06-19 ENCOUNTER — Other Ambulatory Visit (INDEPENDENT_AMBULATORY_CARE_PROVIDER_SITE_OTHER): Payer: Medicare Other

## 2019-06-19 ENCOUNTER — Encounter (INDEPENDENT_AMBULATORY_CARE_PROVIDER_SITE_OTHER): Payer: Medicare Other | Admitting: Vascular Surgery

## 2019-06-19 ENCOUNTER — Encounter (INDEPENDENT_AMBULATORY_CARE_PROVIDER_SITE_OTHER): Payer: Medicare Other

## 2019-07-02 ENCOUNTER — Emergency Department: Payer: Medicare Other

## 2019-07-02 ENCOUNTER — Other Ambulatory Visit: Payer: Self-pay

## 2019-07-02 ENCOUNTER — Inpatient Hospital Stay
Admission: EM | Admit: 2019-07-02 | Discharge: 2019-07-06 | DRG: 312 | Disposition: A | Discharge status: Home / Self Care | Payer: Medicare Other | Attending: Specialist | Admitting: Specialist

## 2019-07-02 DIAGNOSIS — I2510 Atherosclerotic heart disease of native coronary artery without angina pectoris: Secondary | ICD-10-CM | POA: Diagnosis present

## 2019-07-02 DIAGNOSIS — E669 Obesity, unspecified: Secondary | ICD-10-CM | POA: Diagnosis present

## 2019-07-02 DIAGNOSIS — R230 Cyanosis: Secondary | ICD-10-CM | POA: Diagnosis present

## 2019-07-02 DIAGNOSIS — G40909 Epilepsy, unspecified, not intractable, without status epilepticus: Secondary | ICD-10-CM | POA: Diagnosis present

## 2019-07-02 DIAGNOSIS — G4733 Obstructive sleep apnea (adult) (pediatric): Secondary | ICD-10-CM | POA: Diagnosis present

## 2019-07-02 DIAGNOSIS — N186 End stage renal disease: Secondary | ICD-10-CM | POA: Diagnosis present

## 2019-07-02 DIAGNOSIS — G8929 Other chronic pain: Secondary | ICD-10-CM | POA: Diagnosis present

## 2019-07-02 DIAGNOSIS — Z87440 Personal history of urinary (tract) infections: Secondary | ICD-10-CM

## 2019-07-02 DIAGNOSIS — Z96651 Presence of right artificial knee joint: Secondary | ICD-10-CM | POA: Diagnosis present

## 2019-07-02 DIAGNOSIS — Z801 Family history of malignant neoplasm of trachea, bronchus and lung: Secondary | ICD-10-CM

## 2019-07-02 DIAGNOSIS — I132 Hypertensive heart and chronic kidney disease with heart failure and with stage 5 chronic kidney disease, or end stage renal disease: Secondary | ICD-10-CM | POA: Diagnosis present

## 2019-07-02 DIAGNOSIS — Z7951 Long term (current) use of inhaled steroids: Secondary | ICD-10-CM

## 2019-07-02 DIAGNOSIS — N3000 Acute cystitis without hematuria: Secondary | ICD-10-CM | POA: Diagnosis present

## 2019-07-02 DIAGNOSIS — I5032 Chronic diastolic (congestive) heart failure: Secondary | ICD-10-CM | POA: Diagnosis present

## 2019-07-02 DIAGNOSIS — E785 Hyperlipidemia, unspecified: Secondary | ICD-10-CM | POA: Diagnosis present

## 2019-07-02 DIAGNOSIS — Z66 Do not resuscitate: Secondary | ICD-10-CM | POA: Diagnosis present

## 2019-07-02 DIAGNOSIS — Z79891 Long term (current) use of opiate analgesic: Secondary | ICD-10-CM

## 2019-07-02 DIAGNOSIS — Z992 Dependence on renal dialysis: Secondary | ICD-10-CM

## 2019-07-02 DIAGNOSIS — Z87891 Personal history of nicotine dependence: Secondary | ICD-10-CM

## 2019-07-02 DIAGNOSIS — Z79899 Other long term (current) drug therapy: Secondary | ICD-10-CM

## 2019-07-02 DIAGNOSIS — Z9011 Acquired absence of right breast and nipple: Secondary | ICD-10-CM

## 2019-07-02 DIAGNOSIS — Z20828 Contact with and (suspected) exposure to other viral communicable diseases: Secondary | ICD-10-CM | POA: Diagnosis present

## 2019-07-02 DIAGNOSIS — Z853 Personal history of malignant neoplasm of breast: Secondary | ICD-10-CM

## 2019-07-02 DIAGNOSIS — Z794 Long term (current) use of insulin: Secondary | ICD-10-CM

## 2019-07-02 DIAGNOSIS — Z6841 Body Mass Index (BMI) 40.0 and over, adult: Secondary | ICD-10-CM

## 2019-07-02 DIAGNOSIS — F419 Anxiety disorder, unspecified: Secondary | ICD-10-CM | POA: Diagnosis present

## 2019-07-02 DIAGNOSIS — E1122 Type 2 diabetes mellitus with diabetic chronic kidney disease: Secondary | ICD-10-CM

## 2019-07-02 DIAGNOSIS — R55 Syncope and collapse: Principal | ICD-10-CM | POA: Diagnosis present

## 2019-07-02 DIAGNOSIS — R42 Dizziness and giddiness: Secondary | ICD-10-CM

## 2019-07-02 DIAGNOSIS — D631 Anemia in chronic kidney disease: Secondary | ICD-10-CM | POA: Diagnosis present

## 2019-07-02 DIAGNOSIS — J9610 Chronic respiratory failure, unspecified whether with hypoxia or hypercapnia: Secondary | ICD-10-CM | POA: Diagnosis present

## 2019-07-02 DIAGNOSIS — Z87442 Personal history of urinary calculi: Secondary | ICD-10-CM

## 2019-07-02 DIAGNOSIS — K219 Gastro-esophageal reflux disease without esophagitis: Secondary | ICD-10-CM | POA: Diagnosis present

## 2019-07-02 LAB — COMPREHENSIVE METABOLIC PANEL
ALT: 10 U/L (ref 0–44)
AST: 19 U/L (ref 15–41)
Albumin: 3.9 g/dL (ref 3.5–5.0)
Alkaline Phosphatase: 108 U/L (ref 38–126)
Anion gap: 16 — ABNORMAL HIGH (ref 5–15)
BUN: 29 mg/dL — ABNORMAL HIGH (ref 8–23)
CO2: 21 mmol/L — ABNORMAL LOW (ref 22–32)
Calcium: 8.9 mg/dL (ref 8.9–10.3)
Chloride: 97 mmol/L — ABNORMAL LOW (ref 98–111)
Creatinine, Ser: 4.04 mg/dL — ABNORMAL HIGH (ref 0.44–1.00)
GFR calc Af Amer: 12 mL/min — ABNORMAL LOW (ref >60)
GFR calc non Af Amer: 10 mL/min — ABNORMAL LOW (ref >60)
Glucose, Bld: 216 mg/dL — ABNORMAL HIGH (ref 70–99)
Potassium: 4.7 mmol/L (ref 3.5–5.1)
Sodium: 134 mmol/L — ABNORMAL LOW (ref 135–145)
Total Bilirubin: 0.4 mg/dL (ref 0.3–1.2)
Total Protein: 7.4 g/dL (ref 6.5–8.1)

## 2019-07-02 LAB — CBC WITH DIFFERENTIAL/PLATELET
Abs Immature Granulocytes: 0.11 10*3/uL — ABNORMAL HIGH (ref 0.00–0.07)
Basophils Absolute: 0.1 10*3/uL (ref 0.0–0.1)
Basophils Relative: 1 %
Eosinophils Absolute: 0.3 10*3/uL (ref 0.0–0.5)
Eosinophils Relative: 2 %
HCT: 39.6 % (ref 36.0–46.0)
Hemoglobin: 12.1 g/dL (ref 12.0–15.0)
Immature Granulocytes: 1 %
Lymphocytes Relative: 10 %
Lymphs Abs: 1.4 10*3/uL (ref 0.7–4.0)
MCH: 28.1 pg (ref 26.0–34.0)
MCHC: 30.6 g/dL (ref 30.0–36.0)
MCV: 91.9 fL (ref 80.0–100.0)
Monocytes Absolute: 1.4 10*3/uL — ABNORMAL HIGH (ref 0.1–1.0)
Monocytes Relative: 10 %
Neutro Abs: 10.7 10*3/uL — ABNORMAL HIGH (ref 1.7–7.7)
Neutrophils Relative %: 76 %
Platelets: 226 10*3/uL (ref 150–400)
RBC: 4.31 MIL/uL (ref 3.87–5.11)
RDW: 14.6 % (ref 11.5–15.5)
WBC: 14 10*3/uL — ABNORMAL HIGH (ref 4.0–10.5)
nRBC: 0 % (ref 0.0–0.2)

## 2019-07-02 LAB — SARS CORONAVIRUS 2 BY RT PCR (HOSPITAL ORDER, PERFORMED IN ~~LOC~~ HOSPITAL LAB): SARS Coronavirus 2: NEGATIVE

## 2019-07-02 LAB — TROPONIN I (HIGH SENSITIVITY)
Troponin I (High Sensitivity): 89 ng/L — ABNORMAL HIGH (ref <18)
Troponin I (High Sensitivity): 92 ng/L — ABNORMAL HIGH (ref <18)

## 2019-07-02 LAB — BRAIN NATRIURETIC PEPTIDE: B Natriuretic Peptide: 97 pg/mL (ref 0.0–100.0)

## 2019-07-02 MED ORDER — OXYCODONE-ACETAMINOPHEN 5-325 MG PO TABS
1.0000 | ORAL_TABLET | Freq: Once | ORAL | Status: AC
  Administered 2019-07-02: 1 via ORAL
  Filled 2019-07-02: qty 1

## 2019-07-02 MED ORDER — SODIUM CHLORIDE 0.9% FLUSH
3.0000 mL | Freq: Two times a day (BID) | INTRAVENOUS | Status: DC
  Administered 2019-07-03 – 2019-07-06 (×6): 3 mL via INTRAVENOUS

## 2019-07-02 MED ORDER — ONDANSETRON HCL 4 MG/2ML IJ SOLN
4.0000 mg | Freq: Once | INTRAMUSCULAR | Status: AC
  Administered 2019-07-02: 18:00:00 4 mg via INTRAVENOUS
  Filled 2019-07-02: qty 2

## 2019-07-02 MED ORDER — ONDANSETRON HCL 4 MG/2ML IJ SOLN
INTRAMUSCULAR | Status: AC
  Administered 2019-07-02: 22:00:00 4 mg via INTRAVENOUS
  Filled 2019-07-02: qty 2

## 2019-07-02 MED ORDER — HEPARIN SODIUM (PORCINE) 5000 UNIT/ML IJ SOLN
5000.0000 [IU] | Freq: Three times a day (TID) | INTRAMUSCULAR | Status: DC
  Administered 2019-07-03 – 2019-07-06 (×9): 5000 [IU] via SUBCUTANEOUS
  Filled 2019-07-02 (×10): qty 1

## 2019-07-02 MED ORDER — ONDANSETRON HCL 4 MG/2ML IJ SOLN
4.0000 mg | Freq: Once | INTRAMUSCULAR | Status: AC
  Administered 2019-07-02: 22:00:00 4 mg via INTRAVENOUS

## 2019-07-02 NOTE — ED Notes (Signed)
Pt repositioned. HOB adjusted. Pillow and blankets adjusted as requested by pt. Rails up. Bed locked low. Call bell within reach.

## 2019-07-02 NOTE — ED Notes (Signed)
Pt now requesting nausea med again.

## 2019-07-02 NOTE — ED Notes (Signed)
Called lab about missing results for troponin and BNP. Lab states running now. Will send 2nd trop now and told lab this.

## 2019-07-02 NOTE — ED Triage Notes (Addendum)
Pt in via EMS from outpt diaylsis; finished daily diaylsis right before syncopal episode; cyanotic at lips per witness; nausea x1day; chronic neck/back pain; 90/40 BP; 133/54 BP with EMS; 98% RA; NSR 12 lead with EMS; 180 BG.

## 2019-07-02 NOTE — ED Notes (Addendum)
Pt given 2 more warm blankets. Given brief update.

## 2019-07-02 NOTE — ED Notes (Signed)
Pt given 3 more warm blankets. Continues to call out for more blankets. Room temp had already been adjusted for pt; maxed out at 75 degrees. Room continues to shift towards 75.

## 2019-07-02 NOTE — ED Provider Notes (Signed)
[facility] Emergency Department Provider Note    First MD Initiated Contact with Patient 07/02/19 1554     (approximate)  I have reviewed the triage vital signs and the nursing notes.   HISTORY  Chief Complaint Loss of Consciousness    HPI Carolyn Valdez is a 78 y.o. female plosive past medical history presents the ER for evaluation of syncopal episode that occurred towards the end of her dialysis session today.  States that she been feeling some nausea but denied any acute shortness of breath.  Does wear home oxygen.  Was reportedly found to be cyanotic and unresponsive for 1 minute.  No reported seizure activity.  States she has chronic abdominal pain.  Denies any measured fevers.  Does have a history of CHF.    Past Medical History:  Diagnosis Date  . (HFpEF) heart failure with preserved ejection fraction ([place])    a. 06/2018 Echo: EF 60-65%, no rwma. Gr2 DD. Mild LVH. Ao sclerosis w/o stenosis. Mild MR. Mild to mod LAE. Nl RV fxn. Mild RAE. Mod TR. PASP 60-63mmHg.  Marland Kitchen Anxiety   . Breast cancer ([place]) 1997   a. 1997 s/p right breast mastectomy  . Chest pain    a. 11/2008 MV: No ischemia. EF 66%.  . CKD (chronic kidney disease) stage 4, GFR 15-29 ml/min (HCC)   . Coronary artery calcification seen on CT scan    a. 01/2017 Chest CT: Atherosclerotic coronary Ca2+.  . Diabetes mellitus without complication ([place])   . DJD (degenerative joint disease)   . Dysuria   . GIB (gastrointestinal bleeding)    a. 07/2018 EGD: Gastritis w/ hemorrhage, duodenal angioectasia. Friable gastric mucosa w/ bleeding on contact. All treated w/ argon plasma coagulation; b. 07/2018 s/p 2u PRBCs.  . H/O total knee replacement    right  . HTN (hypertension)   . Hypercholesteremia   . Kidney stone   . Obesity   . Recurrent urinary tract infection   . SVT (supraventricular tachycardia) ([place])   . Urinary incontinence   . Vaginal atrophy   . Yeast vaginitis    Family History   Problem Relation Age of Onset  . Lung cancer Father   . Hematuria Mother   . Lung cancer Mother   . Kidney disease Neg Hx   . Bladder Cancer Neg Hx   . Breast cancer Neg Hx    Past Surgical History:  Procedure Laterality Date  . CARDIAC CATHETERIZATION    . CHOLECYSTECTOMY    . COLONOSCOPY N/A 08/06/2018   Procedure: COLONOSCOPY;  Surgeon: Virgel Manifold, MD;  Location: [facility] ENDOSCOPY;  Service: Endoscopy;  Laterality: N/A;  . DIALYSIS/PERMA CATHETER INSERTION N/A 03/16/2019   Procedure: DIALYSIS/PERMA CATHETER INSERTION;  Surgeon: Algernon Huxley, MD;  Location: [place] CV LAB;  Service: Cardiovascular;  Laterality: N/A;  . DIALYSIS/PERMA CATHETER INSERTION N/A 05/29/2019   Procedure: DIALYSIS/PERMA CATHETER INSERTION;  Surgeon: Katha Cabal, MD;  Location: [place] [facility] LAB;  Service: Cardiovascular;  Laterality: N/A;  . ESOPHAGOGASTRODUODENOSCOPY N/A 08/06/2018   Procedure: ESOPHAGOGASTRODUODENOSCOPY (EGD);  Surgeon: Virgel Manifold, MD;  Location: [facility] ENDOSCOPY;  Service: Endoscopy;  Laterality: N/A;  . FOOT SURGERY    . JOINT REPLACEMENT    . MASTECTOMY Right 1997  . NASAL SEPTUM SURGERY    . PERIPHERAL VASCULAR CATHETERIZATION N/A 07/08/2015   Procedure: PICC Line Insertion;  Surgeon: Algernon Huxley, MD;  Location: [place] [facility] LAB;  Service: Cardiovascular;  Laterality: N/A;  .  TEMPORARY DIALYSIS CATHETER N/A 03/09/2019   Procedure: TEMPORARY DIALYSIS CATHETER INSERTION;  Surgeon: Algernon Huxley, MD;  Location: [place] [facility] LAB;  Service: Cardiovascular;  Laterality: N/A;  . TONSILLECTOMY    . Vocal cords     Patient Active Problem List   Diagnosis Date Noted  . Acute on chronic diastolic CHF (congestive heart failure) ([place]) 03/02/2019  . Acute renal failure superimposed on chronic kidney disease ([place]) 03/02/2019  . Anemia associated with chronic renal failure 10/25/2018  . Anemia 08/06/2018  . Acute hemorrhagic gastritis   . Stomach irritation    . Angiodysplasia of stomach and duodenum   . CKD (chronic kidney disease), stage III ([place]) 08/04/2018  . Hyperkalemia 07/04/2018  . Atrial fibrillation with RVR ([place]) 04/23/2018  . Syncope 04/22/2018  . Acute respiratory failure with hypoxia and hypercapnia ([place]) 02/21/2017  . Adjustment disorder with anxiety 01/28/2017  . Acute on chronic respiratory failure with hypoxia ([place]) 01/23/2017  . Palliative care encounter   . Goals of care, counseling/discussion   . DNR (do not resuscitate) 10/05/2016  . Palliative care by specialist 10/05/2016  . Depression, major, recurrent, severe with psychosis ([place]) 10/05/2016  . Severe major depression, single episode, with psychotic features ([place]) 10/04/2016  . Cellulitis of leg, right 09/29/2016  . Proctitis 09/29/2016  . Hypercapnia 09/29/2016  . Acute urinary retention 09/29/2016  . UTI (urinary tract infection) 09/29/2016  . Weakness 09/29/2016  . Subacute delirium 09/28/2016  . Gastrointestinal hemorrhage   . Diffuse abdominal pain   . Altered mental status 09/24/2016  . Pressure injury of skin 09/02/2016  . Respiratory failure with hypoxia ([place]) 09/01/2016  . Lymphedema 08/16/2016  . Acute on chronic heart failure with preserved ejection fraction (HFpEF) ([place]) 08/09/2016  . Acquired lymphedema of leg 04/21/2016  . Congestive heart failure ([place]) 11/25/2015  . Nocturia 11/05/2015  . Urinary frequency 11/05/2015  . Acute respiratory failure with hypoxia ([place]) 09/05/2015  . 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 Incontinence 04/20/2015  . Diabetes mellitus, type 2 ([place]) 04/17/2015  . ESBL (extended spectrum beta-lactamase) producing bacteria infection 04/17/2015  . Hypertension 04/17/2015  . Frequent UTI 04/17/2015  . Absence of bladder continence 04/17/2015  . Iron deficiency anemia 03/08/2015  . Symptomatic anemia 09/25/2014  . Abdominal pain, lower 09/25/2014  . Urge incontinence 02/19/2013  . FOM (frequency of micturition) 02/19/2013  .  Bladder infection, chronic 08/11/2012  . Difficult or painful urination 07/26/2012  . Lower urinary tract infection 12/31/2011  . Diabetes mellitus ([place]) 12/31/2011      Prior to Admission medications   Medication Sig Start Date End Date Taking? Authorizing Provider  acetaminophen (TYLENOL) 325 MG tablet Take 2 tablets (650 mg total) by mouth every 6 (six) hours as needed for mild pain (or Fever >/= 101). 04/26/18   Loletha Grayer, MD  albuterol (PROVENTIL HFA;VENTOLIN HFA) 108 (90 Base) MCG/ACT inhaler Inhale 2 puffs into the lungs every 6 (six) hours as needed for wheezing or shortness of breath. 07/14/18   Nicholes Mango, MD  aspirin 81 MG chewable tablet Chew 1 tablet (81 mg total) by mouth daily. Patient not taking: Reported on 05/24/2019 03/20/19   Dustin Flock, MD  atorvastatin (LIPITOR) 40 MG tablet Take 1 tablet (40 mg total) by mouth daily at 6 PM. Patient taking differently: Take 40 mg by mouth at bedtime.  03/19/19   Dustin Flock, MD  calcium acetate (PHOSLO) 667 MG capsule Take 1,334 mg by mouth 3 (three) times daily before meals.  [provider]  camphor-menthol (MEN-PHOR) lotion Apply 1 application topically every 6 (six) hours as needed for itching.    [provider]  citalopram (CELEXA) 10 MG tablet Take 1 tablet (10 mg total) by mouth daily. 02/04/17   Loletha Grayer, MD  clonazePAM (KLONOPIN) 0.25 MG disintegrating tablet Take 1 tablet (0.25 mg total) by mouth every 12 (twelve) hours as needed (anxiety). 03/19/19   Dustin Flock, MD  docusate sodium (COLACE) 100 MG capsule Take 100 mg by mouth 2 (two) times daily.     [provider]  fluticasone (FLONASE) 50 MCG/ACT nasal spray Place 1 spray into both nostrils daily.     [provider]  glipiZIDE (GLUCOTROL) 5 MG tablet Take 5 mg by mouth daily before breakfast.    [provider]  hydrALAZINE (APRESOLINE) 25 MG tablet Take 1 tablet (25 mg total) by mouth 3 (three) times  daily with meals. 03/19/19   Dustin Flock, MD  insulin aspart (NOVOLOG) 100 UNIT/ML injection 3 (three) times daily before meals. Inject per sliding scale 0-199=0, 200-500=5 units, notify MD for blood sugar> or equal to 500    [provider]  insulin glargine (LANTUS) 100 UNIT/ML injection Inject 0.4 mLs (40 Units total) into the skin daily. Patient taking differently: Inject 45 Units into the skin daily.  03/19/19   Dustin Flock, MD  iron polysaccharides (NIFEREX) 150 MG capsule Take 150 mg by mouth daily.    [provider]  lamoTRIgine (LAMICTAL) 25 MG tablet Take 1 tablet (25 mg total) by mouth daily. 04/26/18   Loletha Grayer, MD  levETIRAcetam (KEPPRA) 250 MG tablet Take 1 tablet (250 mg total) by mouth 2 (two) times daily. 10/26/16   Epifanio Lesches, MD  loratadine (CLARITIN) 10 MG tablet Take 10 mg by mouth daily.    [provider]  losartan (COZAAR) 50 MG tablet Take 50 mg by mouth daily.    [provider]  Melatonin 3 MG TABS Take 6 mg by mouth at bedtime.    [provider]  Meth-Hyo-M Bl-Na Phos-Ph Sal (URIBEL) 118 MG CAPS Take 118 mg by mouth every 6 (six) hours as needed (uti symptoms).    [provider]  metoprolol tartrate (LOPRESSOR) 50 MG tablet Take 50 mg by mouth 2 (two) times daily.    [provider]  mirabegron ER (MYRBETRIQ) 25 MG TB24 tablet Take 25 mg by mouth daily.    [provider]  Multiple Vitamin (MULTIVITAMIN) tablet Take 1 tablet by mouth daily.    [provider]  nystatin (NYSTATIN) powder Apply 1 g topically See admin instructions. Apply to under breast and abdominal once daily & apply every 12 hours as needed for abdominal folds, groin, and under breast    [provider]  OLANZapine (ZYPREXA) 5 MG tablet Take 5 mg by mouth at bedtime.    [provider]  ondansetron (ZOFRAN-ODT) 8 MG disintegrating tablet Take 8 mg by mouth every 8 (eight) hours as  needed for nausea/vomiting. 08/02/18   [provider]  oxyCODONE-acetaminophen (PERCOCET/ROXICET) 5-325 MG tablet Take 1 tablet by mouth every 6 (six) hours as needed for moderate pain or severe pain. 03/19/19   Dustin Flock, MD  pantoprazole (PROTONIX) 40 MG tablet Take 40 mg by mouth 2 (two) times daily.    [provider]  polyethylene glycol (MIRALAX / GLYCOLAX) packet Take 17 g by mouth daily. 04/26/18   Loletha Grayer, MD  saccharomyces boulardii (FLORASTOR) 250 MG capsule  Take 250 mg by mouth daily.    [provider]  sennosides-docusate sodium (SENOKOT-S) 8.6-50 MG tablet Take 1 tablet by mouth at bedtime.    [provider]  tiZANidine (ZANAFLEX) 2 MG tablet Take 2 mg by mouth every 8 (eight) hours as needed for muscle spasms.    [provider]  torsemide (DEMADEX) 20 MG tablet Take 2 tablets (40 mg total) by mouth daily. 07/15/18   Nicholes Mango, MD  vitamin B-12 (CYANOCOBALAMIN) 1000 MCG tablet Take 1,000 mcg by mouth daily.    [provider]    Allergies Sulfa antibiotics, Biaxin [clarithromycin], Influenza a (h1n1) monoval vac, Morphine, Pyridium [phenazopyridine hcl], Ceftriaxone, Latex, Prednisone, and Tape    Social History Social History   Tobacco Use  . Smoking status: Former Research scientist (life sciences)  . Smokeless tobacco: Former Systems developer    Quit date: 10/29/1995  Substance Use Topics  . Alcohol use: No    Alcohol/week: 0.0 standard drinks  . Drug use: No    Review of Systems Patient denies headaches, rhinorrhea, blurry vision, numbness, shortness of breath, chest pain, edema, cough, abdominal pain, nausea, vomiting, diarrhea, dysuria, fevers, rashes or hallucinations unless otherwise stated above in HPI. ____________________________________________   PHYSICAL EXAM:  VITAL SIGNS: Vitals:   07/02/19 2000 07/02/19 2030  BP: (!) 138/58 (!) 142/57  Pulse: 60 63  Resp: 19 14  Temp:    SpO2: 98% 99%    Constitutional: Alert  and oriented. Chronically ill appearing Eyes: Conjunctivae are normal.  Head: Atraumatic. Nose: No congestion/rhinnorhea. Mouth/Throat: Mucous membranes are moist.   Neck: No stridor. Painless ROM.  Cardiovascular: Normal rate, regular rhythm. Grossly normal heart sounds.  Good peripheral circulation. Respiratory: Normal respiratory effort.  No retractions. Lungs with diminished posterior lung sounds. Gastrointestinal: Soft and nontender. No distention. No abdominal bruits. No CVA tenderness. Genitourinary:  Musculoskeletal: No lower extremity tenderness  No joint effusions. Neurologic:  Normal speech and language. No gross focal neurologic deficits are appreciated. No facial droop Skin:  Skin is warm, dry and intact. No rash noted. Psychiatric: Mood and affect are normal. Speech and behavior are normal.  ____________________________________________   LABS (all labs ordered are listed, but only abnormal results are displayed)  Results for orders placed or performed during the hospital encounter of 07/02/19 (from the past 24 hour(s))  CBC with Differential/Platelet     Status: Abnormal   Collection Time: 07/02/19  4:18 PM  Result Value Ref Range   WBC 14.0 (H) 4.0 - 10.5 K/uL   RBC 4.31 3.87 - 5.11 MIL/uL   Hemoglobin 12.1 12.0 - 15.0 g/dL   HCT 39.6 36.0 - 46.0 %   MCV 91.9 80.0 - 100.0 fL   MCH 28.1 26.0 - 34.0 pg   MCHC 30.6 30.0 - 36.0 g/dL   RDW 14.6 11.5 - 15.5 %   Platelets 226 150 - 400 K/uL   nRBC 0.0 0.0 - 0.2 %   Neutrophils Relative % 76 %   Neutro Abs 10.7 (H) 1.7 - 7.7 K/uL   Lymphocytes Relative 10 %   Lymphs Abs 1.4 0.7 - 4.0 K/uL   Monocytes Relative 10 %   Monocytes Absolute 1.4 (H) 0.1 - 1.0 K/uL   Eosinophils Relative 2 %   Eosinophils Absolute 0.3 0.0 - 0.5 K/uL   Basophils Relative 1 %   Basophils Absolute 0.1 0.0 - 0.1 K/uL   Immature Granulocytes 1 %   Abs Immature Granulocytes 0.11 (H) 0.00 - 0.07 K/uL  Comprehensive metabolic  panel     Status:  Abnormal   Collection Time: 07/02/19  4:18 PM  Result Value Ref Range   Sodium 134 (L) 135 - 145 mmol/L   Potassium 4.7 3.5 - 5.1 mmol/L   Chloride 97 (L) 98 - 111 mmol/L   CO2 21 (L) 22 - 32 mmol/L   Glucose, Bld 216 (H) 70 - 99 mg/dL   BUN 29 (H) 8 - 23 mg/dL   Creatinine, Ser 4.04 (H) 0.44 - 1.00 mg/dL   Calcium 8.9 8.9 - 10.3 mg/dL   Total Protein 7.4 6.5 - 8.1 g/dL   Albumin 3.9 3.5 - 5.0 g/dL   AST 19 15 - 41 U/L   ALT 10 0 - 44 U/L   Alkaline Phosphatase 108 38 - 126 U/L   Total Bilirubin 0.4 0.3 - 1.2 mg/dL   GFR calc non Af Amer 10 (L) >60 mL/min   GFR calc Af Amer 12 (L) >60 mL/min   Anion gap 16 (H) 5 - 15  Brain natriuretic peptide     Status: None   Collection Time: 07/02/19  5:34 PM  Result Value Ref Range   B Natriuretic Peptide 97.0 0.0 - 100.0 pg/mL  Troponin I (High Sensitivity)     Status: Abnormal   Collection Time: 07/02/19  7:43 PM  Result Value Ref Range   Troponin I (High Sensitivity) 89 (H) <18 ng/L  SARS Coronavirus 2 [facility] order, Performed in [place] hospital lab) Nasopharyngeal Nasopharyngeal Swab     Status: None   Collection Time: 07/02/19  7:43 PM   Specimen: Nasopharyngeal Swab  Result Value Ref Range   SARS Coronavirus 2 NEGATIVE NEGATIVE   ____________________________________________  EKG My review and personal interpretation at Time: 16:11   Indication: syncope  Rate: 60  Rhythm: sinus Axis: normal Other: normal intervals, no stemi ____________________________________________  RADIOLOGY  I personally reviewed all radiographic images ordered to evaluate for the above acute complaints and reviewed radiology reports and findings.  These findings were personally discussed with the patient.  Please see medical record for radiology report.  ____________________________________________   PROCEDURES  Procedure(s) performed:  Procedures    Critical Care performed: no ____________________________________________   INITIAL  IMPRESSION / ASSESSMENT AND PLAN / ED COURSE  Pertinent labs & imaging results that were available during my care of the patient were reviewed by me and considered in my medical decision making (see chart for details).   DDX: Dysrhythmia, dehydration, hypotension, orthostasis, ACS, anemia, seizure  JOYCELIN RADLOFF is a 78 y.o. who presents to the ED with symptoms as described above.  Patient chronically ill-appearing with complex past medical history of multiple comorbidities.  Neuro exam is reassuring at this time.  Is on chronic O2 found to be unresponsive and cyanotic during this episode despite oxygen.  EKG does not show any acute ischemia but will evaluate for the above differential with lab work imaging and observe in the ER for monitoring.  Clinical Course as of Jul 01 2118  Mon Jul 02, 2019  2115 Patient's code is negative.  Does have elevated troponin which may be her baseline in the setting of her end-stage renal disease on dialysis.  Given the syncopal episode do feel that she would benefit from observation on telemetry for serial enzymes and hemodynamic monitoring.   [PR]    Clinical Course User Index [PR] Merlyn Lot, MD    The patient was evaluated in Emergency Department today for the symptoms described in the history  of present illness. He/she was evaluated in the context of the global COVID-19 pandemic, which necessitated consideration that the patient might be at risk for infection with the SARS-CoV-2 virus that causes COVID-19. Institutional protocols and algorithms that pertain to the evaluation of patients at risk for COVID-19 are in a state of rapid change based on information released by regulatory bodies including the CDC and federal and state organizations. These policies and algorithms were followed during the patient's care in the ED.  As part of my medical decision making, I reviewed the following data within the [place] notes reviewed and  incorporated, Labs reviewed, notes from prior ED visits and [place] Controlled Substance Database   ____________________________________________   FINAL CLINICAL IMPRESSION(S) / ED DIAGNOSES  Final diagnoses:  Syncope and collapse  ESRD (end stage renal disease) on dialysis ([place])      NEW MEDICATIONS STARTED DURING THIS VISIT:  New Prescriptions   No medications on file     Note:  This document was prepared using Dragon voice recognition software and may include unintentional dictation errors.    Merlyn Lot, MD 07/02/19 2119

## 2019-07-02 NOTE — ED Notes (Signed)
Pt given pillow. Updated.

## 2019-07-02 NOTE — ED Notes (Signed)
Called lab to inquire why 2nd troponin not yet resulted.

## 2019-07-02 NOTE — ED Notes (Signed)
Pt given another warm blanket.  

## 2019-07-02 NOTE — ED Notes (Signed)
Pt repositioned in bed. C/o back pain. EDP notified.

## 2019-07-02 NOTE — H&P (Signed)
[facility] [place] NAME: Carolyn Valdez    MR#:  [id number]  DATE OF BIRTH:  [birth year]  DATE OF ADMISSION:  07/02/2019  PRIMARY CARE PHYSICIAN: System, Provider Not In   REQUESTING/REFERRING PHYSICIAN: Merlyn Lot, MD  CHIEF COMPLAINT:   Chief Complaint  Patient presents with  . Loss of Consciousness    HISTORY OF PRESENT ILLNESS:   78 year old female with past medical history of CHF, anxiety, breast cancer, ESRD on HD, CAD, diabetes mellitus, hypertension, hyperlipidemia, recurrent urinary tract infection, SVT, urinary incontinence, and GI bleed presenting to the ED with syncopal episode.  Patient does not recall the events therefore history mostly obtained from patient's chart. Per ED reports, patient was at dialysis and had just completed her sedation when she suddenly passed out.  Per witness, patient went limp with cyanotic lips, prior complaints of nausea chronic neck and back pain. Denies associated symptoms preceding the syncope of aura, nausea and vomiting, feeling cold or clammy, visual auras or blurry vision, palpitations, shortness of breath chest pain. He denies loss of bladder control or tongue biting.   Initial BP was 90/40, saturation 98% on room air.  On arrival to the ED, she was febrile (temp 100.9) with blood pressure 115/31 mm Hg and pulse rate 57 beats/min. There were no focal neurological deficits; she was alert and oriented x4, and she did not demonstrate any memory deficits.  EKG did not show any acute ischemia.  Initial labs revealed elevated white count of 14.0, DM 134, glucose 216, BUN 29, creatinine 4.04, BNP 97, COVID-19 negative, initial troponin 89, repeat 92.  Chest x-ray showed cardiomegaly with vascular congestion but no overt pulmonary edema.  Hospitalists will admit for further evaluation and management.  PAST MEDICAL HISTORY:   Past Medical History:  Diagnosis Date  . (HFpEF) heart failure with  preserved ejection fraction ([place])    a. 06/2018 Echo: EF 60-65%, no rwma. Gr2 DD. Mild LVH. Ao sclerosis w/o stenosis. Mild MR. Mild to mod LAE. Nl RV fxn. Mild RAE. Mod TR. PASP 60-44mmHg.  Marland Kitchen Anxiety   . Breast cancer ([place]) 1997   a. 1997 s/p right breast mastectomy  . Chest pain    a. 11/2008 MV: No ischemia. EF 66%.  . CKD (chronic kidney disease) stage 4, GFR 15-29 ml/min (HCC)   . Coronary artery calcification seen on CT scan    a. 01/2017 Chest CT: Atherosclerotic coronary Ca2+.  . Diabetes mellitus without complication ([place])   . DJD (degenerative joint disease)   . Dysuria   . GIB (gastrointestinal bleeding)    a. 07/2018 EGD: Gastritis w/ hemorrhage, duodenal angioectasia. Friable gastric mucosa w/ bleeding on contact. All treated w/ argon plasma coagulation; b. 07/2018 s/p 2u PRBCs.  . H/O total knee replacement    right  . HTN (hypertension)   . Hypercholesteremia   . Kidney stone   . Obesity   . Recurrent urinary tract infection   . SVT (supraventricular tachycardia) ([place])   . Urinary incontinence   . Vaginal atrophy   . Yeast vaginitis     PAST SURGICAL HISTORY:   Past Surgical History:  Procedure Laterality Date  . CARDIAC CATHETERIZATION    . CHOLECYSTECTOMY    . COLONOSCOPY N/A 08/06/2018   Procedure: COLONOSCOPY;  Surgeon: Virgel Manifold, MD;  Location: [facility] ENDOSCOPY;  Service: Endoscopy;  Laterality: N/A;  . DIALYSIS/PERMA CATHETER INSERTION N/A 03/16/2019   Procedure: DIALYSIS/PERMA CATHETER INSERTION;  Surgeon: Lucky Cowboy,  Erskine Squibb, MD;  Location: [facility];  Service: Cardiovascular;  Laterality: N/A;  . DIALYSIS/PERMA CATHETER INSERTION N/A 05/29/2019   Procedure: DIALYSIS/PERMA CATHETER INSERTION;  Surgeon: Katha Cabal, MD;  Location: [place] [facility];  Service: Cardiovascular;  Laterality: N/A;  . ESOPHAGOGASTRODUODENOSCOPY N/A 08/06/2018   Procedure: ESOPHAGOGASTRODUODENOSCOPY (EGD);  Surgeon: Virgel Manifold, MD;  Location: [facility]  ENDOSCOPY;  Service: Endoscopy;  Laterality: N/A;  . FOOT SURGERY    . JOINT REPLACEMENT    . MASTECTOMY Right 1997  . NASAL SEPTUM SURGERY    . PERIPHERAL VASCULAR CATHETERIZATION N/A 07/08/2015   Procedure: PICC Line Insertion;  Surgeon: Algernon Huxley, MD;  Location: [place] [facility];  Service: Cardiovascular;  Laterality: N/A;  . TEMPORARY DIALYSIS CATHETER N/A 03/09/2019   Procedure: TEMPORARY DIALYSIS CATHETER INSERTION;  Surgeon: Algernon Huxley, MD;  Location: [place] [facility];  Service: Cardiovascular;  Laterality: N/A;  . TONSILLECTOMY    . Vocal cords      SOCIAL HISTORY:   Social History   Tobacco Use  . Smoking status: Former Research scientist (life sciences)  . Smokeless tobacco: Former Systems developer    Quit date: 10/29/1995  Substance Use Topics  . Alcohol use: No    Alcohol/week: 0.0 standard drinks    FAMILY HISTORY:   Family History  Problem Relation Age of Onset  . Lung cancer Father   . Hematuria Mother   . Lung cancer Mother   . Kidney disease Neg Hx   . Bladder Cancer Neg Hx   . Breast cancer Neg Hx     DRUG ALLERGIES:   Allergies  Allergen Reactions  . Sulfa Antibiotics Hives  . Biaxin [Clarithromycin] Hives  . Influenza A (H1n1) Monoval Vac Other (See Comments)    Pt states that she was told by her MD not to get the influenza vaccine.    . Morphine Other (See Comments)    Reaction:  Dizziness and confusion   . Pyridium [Phenazopyridine Hcl] Other (See Comments)    Reaction:  Unknown   . Ceftriaxone Anxiety  . Latex Rash  . Prednisone Rash  . Tape Rash    REVIEW OF SYSTEMS:   Review of Systems  Constitutional: Negative for chills, fever, malaise/fatigue and weight loss.  HENT: Negative for congestion, hearing loss and sore throat.   Eyes: Negative for blurred vision and double vision.  Respiratory: Positive for shortness of breath. Negative for cough and wheezing.   Cardiovascular: Positive for orthopnea and leg swelling. Negative for chest pain and palpitations.   Gastrointestinal: Negative for abdominal pain, diarrhea, nausea and vomiting.  Genitourinary: Negative for dysuria and urgency.  Musculoskeletal: Negative for myalgias.  Skin: Negative for rash.  Neurological: Positive for loss of consciousness. Negative for dizziness, sensory change, speech change, focal weakness and headaches.  Psychiatric/Behavioral: Positive for depression. The patient is nervous/anxious.     MEDICATIONS AT HOME:   Prior to Admission medications   Medication Sig Start Date End Date Taking? Authorizing Provider  acetaminophen (TYLENOL) 325 MG tablet Take 2 tablets (650 mg total) by mouth every 6 (six) hours as needed for mild pain (or Fever >/= 101). 04/26/18  Yes Wieting, Richard, MD  albuterol (PROVENTIL HFA;VENTOLIN HFA) 108 (90 Base) MCG/ACT inhaler Inhale 2 puffs into the lungs every 6 (six) hours as needed for wheezing or shortness of breath. 07/14/18  Yes Gouru, Illene Silver, MD  atorvastatin (LIPITOR) 40 MG tablet Take 1 tablet (40 mg total) by mouth daily at 6 PM.  Patient taking differently: Take 40 mg by mouth at bedtime.  03/19/19  Yes Dustin Flock, MD  citalopram (CELEXA) 10 MG tablet Take 1 tablet (10 mg total) by mouth daily. 02/04/17  Yes Wieting, Richard, MD  clonazePAM (KLONOPIN) 0.25 MG disintegrating tablet Take 1 tablet (0.25 mg total) by mouth every 12 (twelve) hours as needed (anxiety). 03/19/19  Yes Dustin Flock, MD  docusate sodium (COLACE) 100 MG capsule Take 100 mg by mouth 2 (two) times daily.    Yes [provider]  ferric citrate (AURYXIA) 1 GM 210 MG(Fe) tablet Take 420 mg by mouth 3 (three) times daily with meals.   Yes [provider]  fluticasone (FLONASE) 50 MCG/ACT nasal spray Place 1 spray into both nostrils daily.    Yes [provider]  glipiZIDE (GLUCOTROL) 5 MG tablet Take 5 mg by mouth daily before breakfast.   Yes [provider]  hydrALAZINE (APRESOLINE) 25 MG tablet Take 1 tablet (25 mg total) by mouth  3 (three) times daily with meals. 03/19/19  Yes Dustin Flock, MD  insulin aspart (NOVOLOG) 100 UNIT/ML injection 3 (three) times daily before meals. Inject per sliding scale 0-199=0, 200-500=5 units, notify MD for blood sugar> or equal to 500   Yes [provider]  insulin glargine (LANTUS) 100 UNIT/ML injection Inject 0.4 mLs (40 Units total) into the skin daily. Patient taking differently: Inject 45 Units into the skin at bedtime.  03/19/19  Yes Dustin Flock, MD  iron polysaccharides (NIFEREX) 150 MG capsule Take 150 mg by mouth daily.   Yes [provider]  lamoTRIgine (LAMICTAL) 25 MG tablet Take 1 tablet (25 mg total) by mouth daily. 04/26/18  Yes Loletha Grayer, MD  levETIRAcetam (KEPPRA) 250 MG tablet Take 1 tablet (250 mg total) by mouth 2 (two) times daily. 10/26/16  Yes Epifanio Lesches, MD  losartan (COZAAR) 50 MG tablet Take 50 mg by mouth daily.   Yes [provider]  Melatonin 3 MG TABS Take 6 mg by mouth at bedtime.   Yes [provider]  Meth-Hyo-M Bl-Na Phos-Ph Sal (URIBEL) 118 MG CAPS Take 118 mg by mouth every 6 (six) hours as needed (uti symptoms).   Yes [provider]  metoprolol tartrate (LOPRESSOR) 50 MG tablet Take 50 mg by mouth 2 (two) times daily.   Yes [provider]  mirabegron ER (MYRBETRIQ) 25 MG TB24 tablet Take 25 mg by mouth daily.   Yes [provider]  OLANZapine (ZYPREXA) 5 MG tablet Take 5 mg by mouth at bedtime.   Yes [provider]  ondansetron (ZOFRAN-ODT) 8 MG disintegrating tablet Take 8 mg by mouth every 8 (eight) hours as needed for nausea/vomiting. 08/02/18  Yes [provider]  oxyCODONE-acetaminophen (PERCOCET/ROXICET) 5-325 MG tablet Take 1 tablet by mouth every 6 (six) hours as needed for moderate pain or severe pain. 03/19/19  Yes Dustin Flock, MD  pantoprazole (PROTONIX) 40 MG tablet Take 40 mg by mouth daily.    Yes [provider]  polyethylene  glycol (MIRALAX / GLYCOLAX) packet Take 17 g by mouth daily. 04/26/18  Yes Wieting, Richard, MD  saccharomyces boulardii (FLORASTOR) 250 MG capsule Take 250 mg by mouth daily.   Yes [provider]  sennosides-docusate sodium (SENOKOT-S) 8.6-50 MG tablet Take 1 tablet by mouth at bedtime.   Yes [provider]  tiZANidine (ZANAFLEX) 2 MG tablet Take 2 mg by mouth every 8 (eight) hours as needed for muscle spasms.   Yes [provider]  torsemide (DEMADEX) 20 MG tablet Take 2 tablets (40 mg total) by mouth daily. Patient taking differently: Take 20 mg by mouth daily.  07/15/18  Yes Gouru, Illene Silver, MD  vitamin B-12 (CYANOCOBALAMIN) 1000 MCG tablet Take 1,000 mcg by mouth daily.   Yes [provider]  aspirin 81 MG chewable tablet Chew 1 tablet (81 mg total) by mouth daily. Patient not taking: Reported on 05/24/2019 03/20/19   Dustin Flock, MD  calcium acetate (PHOSLO) 667 MG capsule Take 1,334 mg by mouth 3 (three) times daily before meals.    [provider]  camphor-menthol (MEN-PHOR) lotion Apply 1 application topically every 6 (six) hours as needed for itching.    [provider]  loratadine (CLARITIN) 10 MG tablet Take 10 mg by mouth daily.    [provider]  Multiple Vitamin (MULTIVITAMIN) tablet Take 1 tablet by mouth daily.    [provider]  nystatin (NYSTATIN) powder Apply 1 g topically See admin instructions. Apply to under breast and abdominal once daily & apply every 12 hours as needed for abdominal folds, groin, and under breast    [provider]      VITAL SIGNS:  Blood pressure (!) 133/50, pulse 63, temperature 97.7 F (36.5 C), temperature source Oral, resp. rate 16, height 5\' 7"  (1.702 m), weight 113.4 kg, SpO2 100 %.  PHYSICAL EXAMINATION:   Physical Exam   GENERAL:  78 y.o.-year-old patient lying in the bed with no acute distress.  EYES: Pupils equal, round, reactive to light and accommodation.  No scleral icterus. Extraocular muscles intact.  HEENT: Head atraumatic, normocephalic. Oropharynx and nasopharynx clear.  NECK:  Supple, no jugular venous distention. No thyroid enlargement, no tenderness.  LUNGS: Normal breath sounds bilaterally, no wheezing, rales,rhonchi or crepitation. No use of accessory muscles of respiration.  CARDIOVASCULAR: S1, S2 normal. Systolic murmur, rubs, or gallops.  ABDOMEN: Soft, nontender, nondistended. Bowel sounds present. No organomegaly or mass.  EXTREMITIES: Bilateral pitting edema of peripheral extremities, cyanosis, or clubbing.  NEUROLOGIC: Cranial nerves II through XII are intact. Muscle strength 5/5 in all extremities. Sensation intact. Gait not checked.  PSYCHIATRIC: The patient is alert and oriented x 3.  SKIN: No obvious rash, lesion, or ulcer.   DATA REVIEWED:  LABORATORY PANEL:   CBC Recent Labs  Lab 07/02/19 1618  WBC 14.0*  HGB 12.1  HCT 39.6  PLT 226   ------------------------------------------------------------------------------------------------------------------  Chemistries  Recent Labs  Lab 07/02/19 1618  NA 134*  K 4.7  CL 97*  CO2 21*  GLUCOSE 216*  BUN 29*  CREATININE 4.04*  CALCIUM 8.9  AST 19  ALT 10  ALKPHOS 108  BILITOT 0.4   ------------------------------------------------------------------------------------------------------------------  Cardiac Enzymes No results for input(s): TROPONINI in the last 168 hours. ------------------------------------------------------------------------------------------------------------------  RADIOLOGY:  Dg Chest Portable 1 View  Result Date: 07/02/2019 CLINICAL DATA:  Syncope EXAM: PORTABLE CHEST 1 VIEW COMPARISON:  03/03/2019 FINDINGS: Right-sided central venous catheter tip over the right atrium. Cardiomegaly with central vascular congestion. Diffuse interstitial prominence likely due to chronic change. No overt edema. Aortic atherosclerosis. No consolidation or  pneumothorax. IMPRESSION: Cardiomegaly with vascular congestion but no overt pulmonary edema. No focal airspace disease. Electronically Signed   By: Donavan Foil M.D.   On: 07/02/2019 16:22    EKG:  EKG: normal EKG, normal sinus rhythm, unchanged from previous tracings. Vent. rate 61 BPM PR interval * ms QRS duration 92 ms QT/QTc 423/427 ms P-R-T axes 49 34 48  IMPRESSION AND  PLAN:   78 y.o. female with past medical history of CHF, anxiety, breast cancer, ESRD on HD, CAD, diabetes mellitus, hypertension, hyperlipidemia, recurrent urinary tract infection, SVT, urinary incontinence, and GI bleed presenting to the ED with syncopal episode.  Syncope - Etiology unclear no signs of hypoglycemia, shock, hyperventilation or anemia however noted with hypoxia which could have triggered the event. Has prior hx of syncope with negative work up - Admit to telemetry unit - We will obtain CT head  - Recent Echo on 02/2019 with no abnormality EF 50 to 55% - check Orthostatic Vital Signs - obtain EEG  2. Elevated troponin - No c/o of chest pain of dynamic EKG changes - Trend troponin  3.Leukocytosis : WBC 14 with temp of 100.3 - Monitor fever curve - Check UA - CXR negative - Blood cultures - Cultures pending - Trend WBC's and Procalcitonin  4. Remote hx of Seizure - Has been evaluated by Neurology in the past. - EEG as above - Continue Lamictal and Keppra - Seizure precautions  5. Chronic diastolic congestive Heart Failure -no evidence of exacerbation Last Echo 03/02/19 EF 50-55% - Continue metoprolol - Hold torsemide today - Low salt diet  - Strict I's and O's - PT/OT consult  6. Acute on Chronic Kidney disease - BUN/Cr elevated above baseline -ESRD on HD Monday Wednesday Friday post hemodialysis 9/14 -Hold nephrotoxins -Continue to monitor renal function -Nephrology consult placed  7. 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  - Hemoglobin A1c of 7.6% on  04/22/2018 - SSI - Holding glipizide  8. Hypertension -stable  - Continue hydralazine and metoprolol - Hold losartan in the setting of AKI on CKD  9. GERD -continue Protonix  10. DVT prophylaxis - Heparin SubQ   All the records are reviewed and case discussed with ED provider. Management plans discussed with the patient, family and they are in agreement.  CODE STATUS: FULL  TOTAL TIME TAKING CARE OF THIS PATIENT: 50 minutes.    on 07/02/2019 at 11:52 PM   Rufina Falco, DNP, FNP-BC [facility] Nurse Practitioner Between 7am to 6pm - Pager [phone]  After 6pm go to [email or web address] - password [email or web address] [facility]  Office  [phone]  CC: Primary care physician; System, Provider Not In

## 2019-07-02 NOTE — ED Notes (Signed)
Pt requested to be repositioned. Asked to have legs brought down in bed. This RN adjusted legs as requested. Then immediately requested they be pumped back up. Pt then requested HOB be inclined some more. This RN inc degree of HOB per request. Pt immediately requested HOB be brought back down some. Adjusted again per request.

## 2019-07-02 NOTE — ED Notes (Signed)
3rd light green tube send to lab (for 2nd troponin) as lab states they discarded first tube without notifying this RN and used 2nd tube to run first troponin.

## 2019-07-02 NOTE — ED Notes (Signed)
EKG completed

## 2019-07-03 ENCOUNTER — Observation Stay: Payer: Medicare Other

## 2019-07-03 ENCOUNTER — Other Ambulatory Visit: Payer: Self-pay

## 2019-07-03 ENCOUNTER — Other Ambulatory Visit: Payer: Medicare Other

## 2019-07-03 DIAGNOSIS — K219 Gastro-esophageal reflux disease without esophagitis: Secondary | ICD-10-CM | POA: Diagnosis present

## 2019-07-03 DIAGNOSIS — N3000 Acute cystitis without hematuria: Secondary | ICD-10-CM | POA: Diagnosis present

## 2019-07-03 DIAGNOSIS — Z9011 Acquired absence of right breast and nipple: Secondary | ICD-10-CM | POA: Diagnosis not present

## 2019-07-03 DIAGNOSIS — F419 Anxiety disorder, unspecified: Secondary | ICD-10-CM | POA: Diagnosis present

## 2019-07-03 DIAGNOSIS — E669 Obesity, unspecified: Secondary | ICD-10-CM | POA: Diagnosis present

## 2019-07-03 DIAGNOSIS — G40909 Epilepsy, unspecified, not intractable, without status epilepticus: Secondary | ICD-10-CM | POA: Diagnosis present

## 2019-07-03 DIAGNOSIS — Z853 Personal history of malignant neoplasm of breast: Secondary | ICD-10-CM | POA: Diagnosis not present

## 2019-07-03 DIAGNOSIS — I2510 Atherosclerotic heart disease of native coronary artery without angina pectoris: Secondary | ICD-10-CM | POA: Diagnosis present

## 2019-07-03 DIAGNOSIS — Z6841 Body Mass Index (BMI) 40.0 and over, adult: Secondary | ICD-10-CM | POA: Diagnosis not present

## 2019-07-03 DIAGNOSIS — R55 Syncope and collapse: Secondary | ICD-10-CM | POA: Diagnosis present

## 2019-07-03 DIAGNOSIS — R230 Cyanosis: Secondary | ICD-10-CM | POA: Diagnosis present

## 2019-07-03 DIAGNOSIS — D631 Anemia in chronic kidney disease: Secondary | ICD-10-CM | POA: Diagnosis present

## 2019-07-03 DIAGNOSIS — Z20828 Contact with and (suspected) exposure to other viral communicable diseases: Secondary | ICD-10-CM | POA: Diagnosis present

## 2019-07-03 DIAGNOSIS — N186 End stage renal disease: Secondary | ICD-10-CM | POA: Diagnosis present

## 2019-07-03 DIAGNOSIS — R42 Dizziness and giddiness: Secondary | ICD-10-CM | POA: Diagnosis present

## 2019-07-03 DIAGNOSIS — E1122 Type 2 diabetes mellitus with diabetic chronic kidney disease: Secondary | ICD-10-CM | POA: Diagnosis present

## 2019-07-03 DIAGNOSIS — I132 Hypertensive heart and chronic kidney disease with heart failure and with stage 5 chronic kidney disease, or end stage renal disease: Secondary | ICD-10-CM | POA: Diagnosis present

## 2019-07-03 DIAGNOSIS — G4733 Obstructive sleep apnea (adult) (pediatric): Secondary | ICD-10-CM | POA: Diagnosis present

## 2019-07-03 DIAGNOSIS — Z87891 Personal history of nicotine dependence: Secondary | ICD-10-CM | POA: Diagnosis not present

## 2019-07-03 DIAGNOSIS — Z66 Do not resuscitate: Secondary | ICD-10-CM | POA: Diagnosis present

## 2019-07-03 DIAGNOSIS — G8929 Other chronic pain: Secondary | ICD-10-CM | POA: Diagnosis present

## 2019-07-03 DIAGNOSIS — I5032 Chronic diastolic (congestive) heart failure: Secondary | ICD-10-CM | POA: Diagnosis present

## 2019-07-03 DIAGNOSIS — J9610 Chronic respiratory failure, unspecified whether with hypoxia or hypercapnia: Secondary | ICD-10-CM | POA: Diagnosis present

## 2019-07-03 DIAGNOSIS — E785 Hyperlipidemia, unspecified: Secondary | ICD-10-CM | POA: Diagnosis present

## 2019-07-03 LAB — URINALYSIS, ROUTINE W REFLEX MICROSCOPIC
Bilirubin Urine: NEGATIVE
Glucose, UA: NEGATIVE mg/dL
Hgb urine dipstick: NEGATIVE
Ketones, ur: NEGATIVE mg/dL
Nitrite: NEGATIVE
Protein, ur: 100 mg/dL — AB
Specific Gravity, Urine: 1.02 (ref 1.005–1.030)
WBC, UA: >50 WBC/hpf — ABNORMAL HIGH (ref 0–5)
pH: 5 (ref 5.0–8.0)

## 2019-07-03 LAB — COMPREHENSIVE METABOLIC PANEL
ALT: 11 U/L (ref 0–44)
AST: 17 U/L (ref 15–41)
Albumin: 3.5 g/dL (ref 3.5–5.0)
Alkaline Phosphatase: 80 U/L (ref 38–126)
Anion gap: 12 (ref 5–15)
BUN: 37 mg/dL — ABNORMAL HIGH (ref 8–23)
CO2: 25 mmol/L (ref 22–32)
Calcium: 9.1 mg/dL (ref 8.9–10.3)
Chloride: 96 mmol/L — ABNORMAL LOW (ref 98–111)
Creatinine, Ser: 5.03 mg/dL — ABNORMAL HIGH (ref 0.44–1.00)
GFR calc Af Amer: 9 mL/min — ABNORMAL LOW (ref >60)
GFR calc non Af Amer: 8 mL/min — ABNORMAL LOW (ref >60)
Glucose, Bld: 177 mg/dL — ABNORMAL HIGH (ref 70–99)
Potassium: 4.6 mmol/L (ref 3.5–5.1)
Sodium: 133 mmol/L — ABNORMAL LOW (ref 135–145)
Total Bilirubin: 0.5 mg/dL (ref 0.3–1.2)
Total Protein: 6.9 g/dL (ref 6.5–8.1)

## 2019-07-03 LAB — CBC WITH DIFFERENTIAL/PLATELET
Abs Immature Granulocytes: 0.05 10*3/uL (ref 0.00–0.07)
Basophils Absolute: 0.1 10*3/uL (ref 0.0–0.1)
Basophils Relative: 0 %
Eosinophils Absolute: 0.3 10*3/uL (ref 0.0–0.5)
Eosinophils Relative: 2 %
HCT: 35.7 % — ABNORMAL LOW (ref 36.0–46.0)
Hemoglobin: 10.8 g/dL — ABNORMAL LOW (ref 12.0–15.0)
Immature Granulocytes: 0 %
Lymphocytes Relative: 27 %
Lymphs Abs: 3 10*3/uL (ref 0.7–4.0)
MCH: 26.9 pg (ref 26.0–34.0)
MCHC: 30.3 g/dL (ref 30.0–36.0)
MCV: 89 fL (ref 80.0–100.0)
Monocytes Absolute: 1.3 10*3/uL — ABNORMAL HIGH (ref 0.1–1.0)
Monocytes Relative: 11 %
Neutro Abs: 6.6 10*3/uL (ref 1.7–7.7)
Neutrophils Relative %: 60 %
Platelets: 251 10*3/uL (ref 150–400)
RBC: 4.01 MIL/uL (ref 3.87–5.11)
RDW: 14.5 % (ref 11.5–15.5)
WBC: 11.3 10*3/uL — ABNORMAL HIGH (ref 4.0–10.5)
nRBC: 0 % (ref 0.0–0.2)

## 2019-07-03 LAB — GLUCOSE, CAPILLARY
Glucose-Capillary: 128 mg/dL — ABNORMAL HIGH (ref 70–99)
Glucose-Capillary: 184 mg/dL — ABNORMAL HIGH (ref 70–99)
Glucose-Capillary: 150 mg/dL — ABNORMAL HIGH (ref 70–99)
Glucose-Capillary: 234 mg/dL — ABNORMAL HIGH (ref 70–99)
Glucose-Capillary: 174 mg/dL — ABNORMAL HIGH (ref 70–99)
Glucose-Capillary: 223 mg/dL — ABNORMAL HIGH (ref 70–99)

## 2019-07-03 LAB — TROPONIN I (HIGH SENSITIVITY): Troponin I (High Sensitivity): 100 ng/L (ref <18)

## 2019-07-03 LAB — MRSA PCR SCREENING: MRSA by PCR: NEGATIVE

## 2019-07-03 MED ORDER — CALCIUM ACETATE (PHOS BINDER) 667 MG PO CAPS
1334.0000 mg | ORAL_CAPSULE | Freq: Three times a day (TID) | ORAL | Status: DC
  Administered 2019-07-03 – 2019-07-06 (×8): 1334 mg via ORAL
  Filled 2019-07-03 (×12): qty 2

## 2019-07-03 MED ORDER — ADULT MULTIVITAMIN W/MINERALS CH
1.0000 | ORAL_TABLET | Freq: Every day | ORAL | Status: DC
  Administered 2019-07-03 – 2019-07-05 (×3): 1 via ORAL
  Filled 2019-07-03 (×3): qty 1

## 2019-07-03 MED ORDER — POLYSACCHARIDE IRON COMPLEX 150 MG PO CAPS
150.0000 mg | ORAL_CAPSULE | Freq: Every day | ORAL | Status: DC
  Administered 2019-07-03 – 2019-07-06 (×4): 150 mg via ORAL
  Filled 2019-07-03 (×4): qty 1

## 2019-07-03 MED ORDER — LEVETIRACETAM 250 MG PO TABS
250.0000 mg | ORAL_TABLET | Freq: Two times a day (BID) | ORAL | Status: DC
  Administered 2019-07-03 – 2019-07-05 (×6): 250 mg via ORAL
  Filled 2019-07-03 (×8): qty 1

## 2019-07-03 MED ORDER — ONDANSETRON 8 MG PO TBDP
8.0000 mg | ORAL_TABLET | Freq: Three times a day (TID) | ORAL | Status: DC | PRN
  Administered 2019-07-03 (×2): 8 mg via ORAL
  Filled 2019-07-03 (×5): qty 1

## 2019-07-03 MED ORDER — SENNOSIDES-DOCUSATE SODIUM 8.6-50 MG PO TABS
1.0000 | ORAL_TABLET | Freq: Every day | ORAL | Status: DC
  Administered 2019-07-03 – 2019-07-05 (×3): 1 via ORAL
  Filled 2019-07-03 (×3): qty 1

## 2019-07-03 MED ORDER — INSULIN GLARGINE 100 UNIT/ML ~~LOC~~ SOLN
25.0000 [IU] | Freq: Every day | SUBCUTANEOUS | Status: DC
  Administered 2019-07-03 – 2019-07-05 (×3): 25 [IU] via SUBCUTANEOUS
  Filled 2019-07-03 (×4): qty 0.25

## 2019-07-03 MED ORDER — PANTOPRAZOLE SODIUM 40 MG PO TBEC
40.0000 mg | DELAYED_RELEASE_TABLET | Freq: Every day | ORAL | Status: DC
  Administered 2019-07-03 – 2019-07-06 (×4): 40 mg via ORAL
  Filled 2019-07-03 (×4): qty 1

## 2019-07-03 MED ORDER — FLUTICASONE PROPIONATE 50 MCG/ACT NA SUSP
1.0000 | Freq: Every day | NASAL | Status: DC
  Administered 2019-07-04 – 2019-07-06 (×3): 1 via NASAL
  Filled 2019-07-03 (×3): qty 16

## 2019-07-03 MED ORDER — MIRABEGRON ER 25 MG PO TB24
25.0000 mg | ORAL_TABLET | Freq: Every day | ORAL | Status: DC
  Administered 2019-07-03 – 2019-07-06 (×4): 25 mg via ORAL
  Filled 2019-07-03 (×4): qty 1

## 2019-07-03 MED ORDER — METOCLOPRAMIDE HCL 10 MG PO TABS
5.0000 mg | ORAL_TABLET | Freq: Three times a day (TID) | ORAL | Status: DC
  Administered 2019-07-03 – 2019-07-06 (×8): 5 mg via ORAL
  Filled 2019-07-03 (×8): qty 1

## 2019-07-03 MED ORDER — TIZANIDINE HCL 2 MG PO TABS
2.0000 mg | ORAL_TABLET | Freq: Three times a day (TID) | ORAL | Status: DC | PRN
  Filled 2019-07-03: qty 1

## 2019-07-03 MED ORDER — INSULIN ASPART 100 UNIT/ML ~~LOC~~ SOLN
0.0000 [IU] | Freq: Every day | SUBCUTANEOUS | Status: DC
  Administered 2019-07-03: 22:00:00 2 [IU] via SUBCUTANEOUS
  Administered 2019-07-04: 22:00:00 3 [IU] via SUBCUTANEOUS
  Administered 2019-07-05: 23:00:00 2 [IU] via SUBCUTANEOUS
  Filled 2019-07-03 (×3): qty 1

## 2019-07-03 MED ORDER — MELATONIN 5 MG PO TABS
5.0000 mg | ORAL_TABLET | Freq: Every day | ORAL | Status: DC
  Administered 2019-07-03 – 2019-07-05 (×3): 5 mg via ORAL
  Filled 2019-07-03 (×4): qty 1

## 2019-07-03 MED ORDER — ALBUTEROL SULFATE (2.5 MG/3ML) 0.083% IN NEBU: 2.5000 mg | INHALATION_SOLUTION | Freq: Four times a day (QID) | RESPIRATORY_TRACT | Status: DC | PRN

## 2019-07-03 MED ORDER — LEVOFLOXACIN 250 MG PO TABS
250.0000 mg | ORAL_TABLET | ORAL | Status: DC
  Administered 2019-07-03: 250 mg via ORAL
  Filled 2019-07-03: qty 1

## 2019-07-03 MED ORDER — METOPROLOL TARTRATE 50 MG PO TABS
50.0000 mg | ORAL_TABLET | Freq: Two times a day (BID) | ORAL | Status: DC
  Administered 2019-07-03 – 2019-07-06 (×4): 50 mg via ORAL
  Filled 2019-07-03 (×4): qty 1

## 2019-07-03 MED ORDER — ACETAMINOPHEN 325 MG PO TABS
650.0000 mg | ORAL_TABLET | Freq: Four times a day (QID) | ORAL | Status: DC | PRN
  Administered 2019-07-03: 650 mg via ORAL
  Filled 2019-07-03: qty 2

## 2019-07-03 MED ORDER — MECLIZINE HCL 12.5 MG PO TABS
12.5000 mg | ORAL_TABLET | Freq: Three times a day (TID) | ORAL | Status: DC
  Administered 2019-07-03 – 2019-07-06 (×9): 12.5 mg via ORAL
  Filled 2019-07-03 (×14): qty 1

## 2019-07-03 MED ORDER — LORATADINE 10 MG PO TABS
10.0000 mg | ORAL_TABLET | Freq: Every day | ORAL | Status: DC
  Administered 2019-07-03 – 2019-07-06 (×4): 10 mg via ORAL
  Filled 2019-07-03 (×4): qty 1

## 2019-07-03 MED ORDER — OXYCODONE-ACETAMINOPHEN 5-325 MG PO TABS
1.0000 | ORAL_TABLET | Freq: Four times a day (QID) | ORAL | Status: DC | PRN
  Administered 2019-07-03 – 2019-07-05 (×6): 1 via ORAL
  Filled 2019-07-03 (×7): qty 1

## 2019-07-03 MED ORDER — LAMOTRIGINE 25 MG PO TABS
25.0000 mg | ORAL_TABLET | Freq: Every day | ORAL | Status: DC
  Administered 2019-07-03 – 2019-07-06 (×4): 25 mg via ORAL
  Filled 2019-07-03 (×4): qty 1

## 2019-07-03 MED ORDER — DOCUSATE SODIUM 100 MG PO CAPS
100.0000 mg | ORAL_CAPSULE | Freq: Two times a day (BID) | ORAL | Status: DC
  Administered 2019-07-03 – 2019-07-06 (×5): 100 mg via ORAL
  Filled 2019-07-03 (×5): qty 1

## 2019-07-03 MED ORDER — CHLORHEXIDINE GLUCONATE CLOTH 2 % EX PADS
6.0000 | MEDICATED_PAD | Freq: Every day | CUTANEOUS | Status: DC
  Administered 2019-07-04 – 2019-07-05 (×2): 6 via TOPICAL

## 2019-07-03 MED ORDER — INSULIN ASPART 100 UNIT/ML ~~LOC~~ SOLN
0.0000 [IU] | Freq: Three times a day (TID) | SUBCUTANEOUS | Status: DC
  Administered 2019-07-03: 2 [IU] via SUBCUTANEOUS
  Administered 2019-07-03: 3 [IU] via SUBCUTANEOUS
  Administered 2019-07-03: 09:00:00 1 [IU] via SUBCUTANEOUS
  Administered 2019-07-04: 17:00:00 3 [IU] via SUBCUTANEOUS
  Administered 2019-07-04: 1 [IU] via SUBCUTANEOUS
  Administered 2019-07-05 (×2): 2 [IU] via SUBCUTANEOUS
  Administered 2019-07-05: 3 [IU] via SUBCUTANEOUS
  Administered 2019-07-06: 1 [IU] via SUBCUTANEOUS
  Filled 2019-07-03 (×10): qty 1

## 2019-07-03 MED ORDER — ATORVASTATIN CALCIUM 20 MG PO TABS
40.0000 mg | ORAL_TABLET | Freq: Every day | ORAL | Status: DC
  Administered 2019-07-03 – 2019-07-05 (×3): 40 mg via ORAL
  Filled 2019-07-03 (×3): qty 2

## 2019-07-03 MED ORDER — INSULIN GLARGINE 100 UNIT/ML ~~LOC~~ SOLN: 45.0000 [IU] | Freq: Every day | SUBCUTANEOUS | Status: DC

## 2019-07-03 MED ORDER — VITAMIN B-12 1000 MCG PO TABS
1000.0000 ug | ORAL_TABLET | Freq: Every day | ORAL | Status: DC
  Administered 2019-07-03 – 2019-07-06 (×4): 1000 ug via ORAL
  Filled 2019-07-03 (×4): qty 1

## 2019-07-03 MED ORDER — SACCHAROMYCES BOULARDII 250 MG PO CAPS
250.0000 mg | ORAL_CAPSULE | Freq: Every day | ORAL | Status: DC
  Administered 2019-07-03 – 2019-07-06 (×4): 250 mg via ORAL
  Filled 2019-07-03 (×4): qty 1

## 2019-07-03 MED ORDER — OLANZAPINE 5 MG PO TABS
5.0000 mg | ORAL_TABLET | Freq: Every day | ORAL | Status: DC
  Administered 2019-07-03 – 2019-07-05 (×3): 5 mg via ORAL
  Filled 2019-07-03 (×4): qty 1

## 2019-07-03 MED ORDER — CITALOPRAM HYDROBROMIDE 20 MG PO TABS
10.0000 mg | ORAL_TABLET | Freq: Every day | ORAL | Status: DC
  Administered 2019-07-03 – 2019-07-06 (×4): 10 mg via ORAL
  Filled 2019-07-03 (×4): qty 1

## 2019-07-03 MED ORDER — HYDRALAZINE HCL 25 MG PO TABS
25.0000 mg | ORAL_TABLET | Freq: Three times a day (TID) | ORAL | Status: DC
  Administered 2019-07-03 – 2019-07-06 (×8): 25 mg via ORAL
  Filled 2019-07-03 (×8): qty 1

## 2019-07-03 MED ORDER — CAMPHOR-MENTHOL 0.5-0.5 % EX LOTN
1.0000 "application " | TOPICAL_LOTION | Freq: Four times a day (QID) | CUTANEOUS | Status: DC | PRN
  Filled 2019-07-03: qty 222

## 2019-07-03 MED ORDER — NYSTATIN 100000 UNIT/GM EX POWD
1.0000 g | CUTANEOUS | Status: DC
  Filled 2019-07-03: qty 15

## 2019-07-03 MED ORDER — FERRIC CITRATE 1 GM 210 MG(FE) PO TABS
420.0000 mg | ORAL_TABLET | Freq: Three times a day (TID) | ORAL | Status: DC
  Administered 2019-07-03 – 2019-07-06 (×8): 420 mg via ORAL
  Filled 2019-07-03 (×12): qty 2

## 2019-07-03 MED ORDER — CLONAZEPAM 0.125 MG PO TBDP
0.2500 mg | ORAL_TABLET | Freq: Two times a day (BID) | ORAL | Status: DC | PRN
  Administered 2019-07-03 (×2): 0.25 mg via ORAL
  Filled 2019-07-03 (×2): qty 2

## 2019-07-03 MED ORDER — URIBEL 118 MG PO CAPS: 118.0000 mg | ORAL_CAPSULE | Freq: Four times a day (QID) | ORAL | Status: DC | PRN

## 2019-07-03 MED ORDER — POLYETHYLENE GLYCOL 3350 17 G PO PACK
17.0000 g | PACK | Freq: Every day | ORAL | Status: DC
  Administered 2019-07-03 – 2019-07-05 (×2): 17 g via ORAL
  Filled 2019-07-03 (×3): qty 1

## 2019-07-03 NOTE — Care Management Obs Status (Signed)
[place] NOTIFICATION   Patient Details  Name: Carolyn Valdez MRN: [id number] Date of Birth: May 09, 1941   Medicare Observation Status Notification Given:  Yes    Shade Flood, LCSW 07/03/2019, 1:55 PM

## 2019-07-03 NOTE — Procedures (Signed)
Patient Name: Carolyn Valdez  MRN: [id number]  Epilepsy Attending: Lora Havens  Referring Physician/Provider: Rufina Falco, NP Date: 07/03/2019 Duration: 24.36 mins  Patient history: 78yo m with syncope. EEG to evaluate for seizure  Level of alertness: awake  AEDs during EEG study: LEV, LTG, clonazepam  Technical aspects: This EEG study was done with scalp electrodes positioned according to the 10-20 International system of electrode placement. Electrical activity was acquired at a sampling rate of 500Hz  and reviewed with a high frequency filter of 70Hz  and a low frequency filter of 1Hz . EEG data were recorded continuously and digitally stored.   DESCRIPTION:  The posterior dominant rhythm consists of 8 Hz activity of moderate voltage (25-35 uV) seen predominantly in posterior head regions, symmetric and reactive to eye opening and eye closing.  Drowsiness was characterized by attenuation of the posterior background rhythm. There was intermittent rhythmic 2-3hz  generalized delta slowing. Physiologic photic driving was seen during photic stimulation. Hyperventilation was not performed.  ABNORMALITY - Intermittent rhythmic slow, generalized  IMPRESSION: This study is suggestive of mild diffuse encephalopathy, non specific to etiology. No seizures or epileptiform discharges were seen throughout the recording.  Boss Danielsen Barbra Sarks

## 2019-07-03 NOTE — Progress Notes (Signed)
[facility], [place] 07/03/19  Subjective:   LOS: 0 No intake/output data recorded. Patient known to our practice from outpatient dialysis Presents for witnessed syncope postdialysis Patient does not remember the episode This morning, she is sitting up in the bed eating her breakfast.  States her appetite has been low.  Denies any shortness of breath.  Currently on room air.  Objective:  Vital signs in last 24 hours:  Temp:  [97.7 F (36.5 C)-100.9 F (38.3 C)] 98.6 F (37 C) (09/15 0806) Pulse Rate:  [57-67] 62 (09/15 0943) Resp:  [11-20] 16 (09/15 0806) BP: (112-154)/(25-65) 129/43 (09/15 0806) SpO2:  [97 %-100 %] 98 % (09/15 0943) Weight:  [113.4 kg-117.7 kg] 117.7 kg (09/15 0003)  Weight change:  Filed Weights   07/02/19 1605 07/03/19 0003  Weight: 113.4 kg 117.7 kg    Intake/Output:    Intake/Output Summary (Last 24 hours) at 07/03/2019 1232 Last data filed at 07/03/2019 1003 Gross per 24 hour  Intake 360 ml  Output -  Net 360 ml    Gen:   Alert, cooperative, no distress, laying in bed Head:   Normocephalic, without obvious abnormality, atraumatic Eyes/ENT:  conjunctiva/corneas clear,  moist oral mucus membranes Neck:  Supple,  thyroid: not enlarged, no JVD Lungs:   Clear to auscultation bilaterally, respirations unlabored Heart:   Regular rate and rhythm Abdomen:   Soft, non-tender,  Extremities: no cyanosis or edema Skin:  Skin color, texture, turgor normal, no rashes or lesions Neurologic: Alert and oriented, able to answer questions appropriately Rt IJ PC   Basic Metabolic Panel:  Recent Labs  Lab 07/02/19 1618 07/03/19 0635  NA 134* 133*  K 4.7 4.6  CL 97* 96*  CO2 21* 25  GLUCOSE 216* 177*  BUN 29* 37*  CREATININE 4.04* 5.03*  CALCIUM 8.9 9.1     CBC: Recent Labs  Lab 07/02/19 1618 07/03/19 0635  WBC 14.0* 11.3*  NEUTROABS 10.7* 6.6  HGB 12.1 10.8*  HCT 39.6 35.7*  MCV 91.9 89.0  PLT 226 251       Lab Results  Component Value Date   HEPBSAG Negative 03/09/2019   HEPBSAB Non Reactive 03/09/2019      Microbiology:  Recent Results (from the past 240 hour(s))  SARS Coronavirus 2 [facility] order, Performed in [facility] hospital lab) Nasopharyngeal Nasopharyngeal Swab     Status: None   Collection Time: 07/02/19  7:43 PM   Specimen: Nasopharyngeal Swab  Result Value Ref Range Status   SARS Coronavirus 2 NEGATIVE NEGATIVE Final    Comment: (NOTE) If result is NEGATIVE SARS-CoV-2 target nucleic acids are NOT DETECTED. The SARS-CoV-2 RNA is generally detectable in upper and lower  respiratory specimens during the acute phase of infection. The lowest  concentration of SARS-CoV-2 viral copies this assay can detect is 250  copies / mL. A negative result does not preclude SARS-CoV-2 infection  and should not be used as the sole basis for treatment or other  patient management decisions.  A negative result may occur with  improper specimen collection / handling, submission of specimen other  than nasopharyngeal swab, presence of viral mutation(s) within the  areas targeted by this assay, and inadequate number of viral copies  (<250 copies / mL). A negative result must be combined with clinical  observations, patient history, and epidemiological information. If result is POSITIVE SARS-CoV-2 target nucleic acids are DETECTED. The SARS-CoV-2 RNA is generally detectable in upper and lower  respiratory specimens dur  ing the acute phase of infection.  Positive  results are indicative of active infection with SARS-CoV-2.  Clinical  correlation with patient history and other diagnostic information is  necessary to determine patient infection status.  Positive results do  not rule out bacterial infection or co-infection with other viruses. If result is PRESUMPTIVE POSTIVE SARS-CoV-2 nucleic acids MAY BE PRESENT.   A presumptive positive result was obtained on the submitted specimen  and  confirmed on repeat testing.  While 2019 novel coronavirus  (SARS-CoV-2) nucleic acids may be present in the submitted sample  additional confirmatory testing may be necessary for epidemiological  and / or clinical management purposes  to differentiate between  SARS-CoV-2 and other Sarbecovirus currently known to infect humans.  If clinically indicated additional testing with an alternate test  methodology [phone]) is advised. The SARS-CoV-2 RNA is generally  detectable in upper and lower respiratory sp ecimens during the acute  phase of infection. The expected result is Negative. Fact Sheet for Patients:  StrictlyIdeas.no Fact Sheet for Healthcare Providers: BankingDealers.co.za This test is not yet approved or cleared by the [place] FDA and has been authorized for detection and/or diagnosis of SARS-CoV-2 by FDA under an Emergency Use Authorization (EUA).  This EUA will remain in effect (meaning this test can be used) for the duration of the COVID-19 declaration under Section 564(b)(1) of the Act, 21 U.S.C. section 360bbb-3(b)(1), unless the authorization is terminated or revoked sooner. Performed at [facility], [address] [place], [place] [address]   MRSA PCR Screening     Status: None   Collection Time: 07/03/19 12:31 AM   Specimen: Nasal Mucosa; Nasopharyngeal  Result Value Ref Range Status   MRSA by PCR NEGATIVE NEGATIVE Final    Comment:        The GeneXpert MRSA Assay (FDA approved for NASAL specimens only), is one component of a comprehensive MRSA colonization surveillance program. It is not intended to diagnose MRSA infection nor to guide or monitor treatment for MRSA infections. Performed at [facility], [address] [place], [place] [address]     Coagulation Studies: No results for input(s): LABPROT, INR in the last 72 hours.  Urinalysis: Recent Labs    07/03/19 0600   COLORURINE AMBER*  LABSPEC 1.020  PHURINE 5.0  GLUCOSEU NEGATIVE  HGBUR NEGATIVE  BILIRUBINUR NEGATIVE  KETONESUR NEGATIVE  PROTEINUR 100*  NITRITE NEGATIVE  LEUKOCYTESUR LARGE*      Imaging: Ct Head Wo Contrast  Result Date: 07/03/2019 CLINICAL DATA:  79 year old female with syncope. EXAM: CT HEAD WITHOUT CONTRAST TECHNIQUE: Contiguous axial images were obtained from the base of the skull through the vertex without intravenous contrast. COMPARISON:  Brain MRI 10/22/2016, head CT 07/04/2018. FINDINGS: Brain: Stable cerebral volume since 2019. No midline shift, ventriculomegaly, mass effect, evidence of mass lesion, intracranial hemorrhage or evidence of cortically based acute infarction. Stable gray-white matter differentiation throughout the brain. Patchy periventricular hypodensity similar to the 2018 MRI FLAIR signal changes. Vascular: Extensive Calcified atherosclerosis at the skull base. No suspicious intracranial vascular hyperdensity. Skull: No acute osseous abnormality identified. Sinuses/Orbits: Visualized paranasal sinuses and mastoids are stable and well pneumatized. Other: No acute orbit or scalp soft tissue findings. There is some calcified scalp vessel atherosclerosis. IMPRESSION: No acute intracranial abnormality. Stable non contrast CT appearance of the brain since 2019. Electronically Signed   By: Genevie Ann M.D.   On: 07/03/2019 01:42   Dg Chest Portable 1 View  Result Date: 07/02/2019 CLINICAL DATA:  Syncope EXAM: PORTABLE CHEST 1 VIEW COMPARISON:  03/03/2019 FINDINGS: Right-sided central venous catheter tip over the right atrium. Cardiomegaly with central vascular congestion. Diffuse interstitial prominence likely due to chronic change. No overt edema. Aortic atherosclerosis. No consolidation or pneumothorax. IMPRESSION: Cardiomegaly with vascular congestion but no overt pulmonary edema. No focal airspace disease. Electronically Signed   By: Donavan Foil M.D.   On: 07/02/2019  16:22     Medications:    . atorvastatin  40 mg Oral q1800  . calcium acetate  1,334 mg Oral TID AC  . citalopram  10 mg Oral Daily  . docusate sodium  100 mg Oral BID  . ferric citrate  420 mg Oral TID WC  . fluticasone  1 spray Each Nare Daily  . heparin  5,000 Units Subcutaneous Q8H  . hydrALAZINE  25 mg Oral TID WC  . insulin aspart  0-5 Units Subcutaneous QHS  . insulin aspart  0-9 Units Subcutaneous TID WC  . insulin glargine  25 Units Subcutaneous QHS  . iron polysaccharides  150 mg Oral Daily  . lamoTRIgine  25 mg Oral Daily  . levETIRAcetam  250 mg Oral BID  . loratadine  10 mg Oral Daily  . meclizine  12.5 mg Oral TID  . Melatonin  5 mg Oral QHS  . metoCLOPramide  5 mg Oral TID AC  . metoprolol tartrate  50 mg Oral BID  . mirabegron ER  25 mg Oral Daily  . multivitamin with minerals  1 tablet Oral Daily  . nystatin  1 g Topical See admin instructions  . OLANZapine  5 mg Oral QHS  . pantoprazole  40 mg Oral Daily  . polyethylene glycol  17 g Oral Daily  . saccharomyces boulardii  250 mg Oral Daily  . senna-docusate  1 tablet Oral QHS  . sodium chloride flush  3 mL Intravenous Q12H  . vitamin B-12  1,000 mcg Oral Daily   acetaminophen, albuterol, camphor-menthol, clonazePAM, ondansetron, oxyCODONE-acetaminophen, tiZANidine, Uribel  Assessment/ Plan:  78 y.o. female with congestive heart failure, hypertension, diabetes type 2, obstructive sleep apnea, nephrolithiasis.  Started chronic dialysis June 2020  Active Problems:   Syncope  [facility] [address]/120 kg/ MWF/240 min  #.  End-stage renal disease Recent Labs    07/02/19 1618 07/03/19 1610  CREATININE 4.04* 5.03*  Will arrange for hemodialysis tomorrow   #. Anemia of CKD  Lab Results  Component Value Date   HGB 10.8 (L) 07/03/2019  Continue EPO with hemodialysis  #. [facility]     Component Value Date/Time   PTH 51 03/04/2019 0507   Lab Results  Component Value Date   PHOS 3.0 03/13/2019   Monitor phosphorus  #Syncope ? May be related to excessive ultrafiltration Outpatient dialysis target weight will need to be increased ?121-122 kg  #. Diabetes type 2 with CKD Hemoglobin A1C (%)  Date Value  09/04/2014 6.2   Hgb A1c MFr Bld (%)  Date Value  04/22/2018 7.6 (H)     LOS: 0 Rilla Buckman 9/15/202012:32 [place], [place]

## 2019-07-03 NOTE — Evaluation (Signed)
Physical Therapy Evaluation Patient Details Name: Carolyn Valdez MRN: [id number] DOB: [birth year] Today's Date: 07/03/2019   History of Present Illness  Carolyn Valdez is a 23yoF who comes to [facility] 9/14 from dialysis and had just completed her session when she suddenly passed out.  Per witness, patient went limp with cyanotic lips, prior complaints of nausea chronic neck and back painPMH: CHF, anxiety, breast cancer (RUE restricted), ESRD on HD MWF (since June 2020), CAD, diabetes mellitus, hypertension, hyperlipidemia, recurrent urinary tract infection, SVT, urinary incontinence, and GI bleed.  Clinical Impression  Pt admitted with above diagnosis. Pt currently with functional limitations due to the deficits listed below (see "PT Problem List"). Upon entry, pt in bed, awake and agreeable to participate. Pt reports feeling nauseated which is a frequent phenomenon since starting HD in June. Pt also tired after a sleepless night. The pt is alert and oriented x4, pleasant, conversational, and generally a good historian. Pt has dizziness that is atypical, she's really at a loss for words in attempts to qualify it, but dizziness does not correlate to BP in sitting and standing, remains stable with positional changes. Max A for bed mobility and mod-maxA to stand. Functional mobility assessment demonstrates increased effort/time requirements, poor tolerance, and need for physical assistance, whereas the patient performed these at a higher level of independence PTA. Pt will benefit from skilled PT intervention to increase independence and safety with basic mobility in preparation for discharge to the venue listed below.       Follow Up Recommendations SNF;Supervision - Intermittent;Supervision for mobility/OOB(Pt is active with PT PTA)    Equipment Recommendations  None recommended by PT    Recommendations for Other Services       Precautions / Restrictions Precautions Precautions: Fall Precaution Comments:  recent syncope Restrictions Weight Bearing Restrictions: No      Mobility  Bed Mobility Overal bed mobility: Needs Assistance Bed Mobility: Supine to Sit;Sit to Supine     Supine to sit: Mod assist Sit to supine: Mod assist   General bed mobility comments: labored and fairly tolerated  Transfers Overall transfer level: Needs assistance Equipment used: Rolling walker (2 wheeled) Transfers: Sit to/from Stand Sit to Stand: Mod assist         General transfer comment: Able to maintain standing x 60sec with stable dizziness, BP flat  Ambulation/Gait Ambulation/Gait assistance: (deferred. Pt largely uses WC for mobility.)              Stairs            Wheelchair Mobility    Modified Rankin (Stroke Patients Only)       Balance Overall balance assessment: Modified Independent;No apparent balance deficits (not formally assessed)                                           Pertinent Vitals/Pain Pain Assessment: (back pain 10/10, "my sciatic")    Home Living Family/patient expects to be discharged to:: Assisted living([facility] since Jan 2020)               Home Equipment: Gilford Rile - 2 wheels;Wheelchair - Brewing technologist - built in      Prior Function Level of Independence: Needs assistance   Gait / Transfers Assistance Needed: baseline AMB from recliner to BR with RW; WC for facility distances but unable to self propel  ADL's / [employer]  Needed: MaxA for bathing/dressing; unable to wipe herself posteriorly when toiletting  Comments: Uses lift chair to come to standing or elevated toilet seat to rise with grab bars; sleeps in recliner.     Hand Dominance   Dominant Hand: Right    Extremity/Trunk Assessment   Upper Extremity Assessment Upper Extremity Assessment: Generalized weakness    Lower Extremity Assessment Lower Extremity Assessment: Generalized weakness       Communication   Communication:  No difficulties  Cognition Arousal/Alertness: Awake/alert Behavior During Therapy: WFL for tasks assessed/performed Overall Cognitive Status: Within Functional Limits for tasks assessed                                        General Comments      Exercises     Assessment/Plan    PT Assessment Patient needs continued PT services  PT Problem List Decreased strength;Decreased activity tolerance;Decreased mobility       PT Treatment Interventions DME instruction;Gait training;Functional mobility training;Therapeutic activities;Therapeutic exercise;Patient/family education;Balance training    PT Goals (Current goals can be found in the Care Plan section)  Acute Rehab PT Goals Patient Stated Goal: regain stength and feel less dizzy PT Goal Formulation: With patient Time For Goal Achievement: 07/17/19 Potential to Achieve Goals: Fair    Frequency Min 2X/week   Barriers to discharge        Co-evaluation               AM-PAC PT "6 Clicks" Mobility  Outcome Measure Help needed turning from your back to your side while in a flat bed without using bedrails?: A Lot Help needed moving from lying on your back to sitting on the side of a flat bed without using bedrails?: A Lot Help needed moving to and from a bed to a chair (including a wheelchair)?: A Lot Help needed standing up from a chair using your arms (e.g., wheelchair or bedside chair)?: A Lot Help needed to walk in hospital room?: A Lot Help needed climbing 3-5 steps with a railing? : Total 6 Click Score: 11    End of Session Equipment Utilized During Treatment: Oxygen Activity Tolerance: Patient limited by fatigue;Other (comment)(worse dizziness with sitting and standing.) Patient left: in bed;with bed alarm set;with call bell/phone within reach   PT Visit Diagnosis: Muscle weakness (generalized) (M62.81);Difficulty in walking, not elsewhere classified (R26.2);Dizziness and giddiness (R42)     Time: 8144-8185 PT Time Calculation (min) (ACUTE ONLY): 35 min   Charges:   PT Evaluation $PT Eval Moderate Complexity: 1 Mod PT Treatments $Therapeutic Exercise: 8-22 mins       11:17 AM, 07/03/19 Etta Grandchild, PT, DPT Physical Therapist - [facility]  [phone] ([place])   Penne Rosenstock C 07/03/2019, 11:08 AM

## 2019-07-03 NOTE — Progress Notes (Signed)
Patient was able to work with PT, it appears from PT note that patient was not orthostatic. This RN, Bonnita Hollow RN, and nurse assistant Cortney have insisted that patient get up to [facility] to void where we can perform another set of orthostatic blood pressure measurements and patient has refused to get up to [facility], stating she just wants to use the bed pan for today.

## 2019-07-03 NOTE — Progress Notes (Signed)
Established hemodialysis patient known at [facility] MWF 11:00, patient is transported by [place]. Please contact me directly with any dialysis placement concerns.  Elvera Bicker Dialysis Coordinator [phone]

## 2019-07-03 NOTE — Plan of Care (Signed)
  Problem: Education: Goal: Knowledge of General Education information will improve Description: Including pain rating scale, medication(s)/side effects and non-pharmacologic comfort measures Outcome: Progressing Note: Patient profile completed. Patient denies nausea. Patient continues to have chronic pain, received pain medication in ED. Patient was transported to CT for a brain evaluation. Patient is resting.

## 2019-07-03 NOTE — TOC Initial Note (Signed)
Transition of Care [facility]) - Initial/Assessment Note    Patient Details  Name: Carolyn Valdez MRN: [id number] Date of Birth: 1940-11-04  Transition of Care [facility]) CM/SW Contact:    Shade Flood, LCSW Phone Number: 07/03/2019, 1:56 PM  Clinical Narrative:                  Pt is Observation status. PT evaluated and recommended SNF. Spoke with pt who states she plans to return to her [place] apartment at [employer]. She states that once she feels better, she thinks she will be more independent. Per pt, she has a walker and a w/c at home. She has been receiving HH PT but isn't sure if it's through [facility] or another agency.   Pt receives outpatient HD at [facility] MWF. [name] assists with transport for pt.   Spoke with Lattie Haw from [facility] today to update on pt status.  Pt is not anticipating new needs for dc. She is aware that TOC will follow and assist if needs arise.  Expected Discharge Plan: Assisted Living Barriers to Discharge: Continued Medical Work up   Patient Goals and CMS Choice        Expected Discharge Plan and Services Expected Discharge Plan: Assisted Living     Post Acute Care Choice: Resumption of Svcs/PTA Provider Living arrangements for the past 2 months: Assisted Living Facility                                      Prior Living Arrangements/Services Living arrangements for the past 2 months: [place] Lives with:: Facility Resident Patient language and need for interpreter reviewed:: Yes Do you feel safe going back to the place where you live?: Yes      Need for Family Participation in Patient Care: No (Comment) Care giver support system in place?: Yes (comment) Current home services: DME, Home PT Criminal Activity/Legal Involvement Pertinent to Current Situation/Hospitalization: No - Comment as needed  Activities of Daily Living Home Assistive Devices/Equipment: Eyeglasses, Grab bars around toilet ADL Screening  (condition at time of admission) Patient's cognitive ability adequate to safely complete daily activities?: Yes Is the patient deaf or have difficulty hearing?: No Does the patient have difficulty seeing, even when wearing glasses/contacts?: No Does the patient have difficulty concentrating, remembering, or making decisions?: No Patient able to express need for assistance with ADLs?: Yes Does the patient have difficulty dressing or bathing?: Yes Independently performs ADLs?: No Communication: Independent Dressing (OT): Needs assistance Is this a change from baseline?: Pre-admission baseline Grooming: Needs assistance Is this a change from baseline?: Pre-admission baseline Feeding: Independent Bathing: Needs assistance Is this a change from baseline?: Pre-admission baseline Toileting: Needs assistance Is this a change from baseline?: Pre-admission baseline In/Out Bed: Independent Walks in Home: Needs assistance Is this a change from baseline?: Pre-admission baseline Does the patient have difficulty walking or climbing stairs?: No Weakness of Legs: Both Weakness of Arms/Hands: Both  Permission Sought/Granted                  Emotional Assessment Appearance:: Appears younger than stated age Attitude/Demeanor/Rapport: Engaged Affect (typically observed): Pleasant Orientation: : Oriented to Self, Oriented to Place, Oriented to  Time, Oriented to Situation Alcohol / Substance Use: Not Applicable Psych Involvement: No (comment)  Admission diagnosis:  Syncope and collapse [R55] ESRD (end stage renal disease) on dialysis ([place]) [N18.6, Z99.2] Patient Active Problem  List   Diagnosis Date Noted  . Acute on chronic diastolic CHF (congestive heart failure) ([place]) 03/02/2019  . Acute renal failure superimposed on chronic kidney disease ([place]) 03/02/2019  . Anemia associated with chronic renal failure 10/25/2018  . Anemia 08/06/2018  . Acute hemorrhagic gastritis   . Stomach  irritation   . Angiodysplasia of stomach and duodenum   . CKD (chronic kidney disease), stage III ([place]) 08/04/2018  . Hyperkalemia 07/04/2018  . Atrial fibrillation with RVR ([place]) 04/23/2018  . Syncope 04/22/2018  . Acute respiratory failure with hypoxia and hypercapnia ([place]) 02/21/2017  . Adjustment disorder with anxiety 01/28/2017  . Acute on chronic respiratory failure with hypoxia () 01/23/2017  . Palliative care encounter   . Goals of care, counseling/discussion   . DNR (do not resuscitate) 10/05/2016  . Palliative care by specialist 10/05/2016  . Depression, major, recurrent, severe with psychosis ([place]) 10/05/2016  . Severe major depression, single episode, with psychotic features ([place]) 10/04/2016  . Cellulitis of leg, right 09/29/2016  . Proctitis 09/29/2016  . Hypercapnia 09/29/2016  . Acute urinary retention 09/29/2016  . UTI (urinary tract infection) 09/29/2016  . Weakness 09/29/2016  . Subacute delirium 09/28/2016  . Gastrointestinal hemorrhage   . Diffuse abdominal pain   . Altered mental status 09/24/2016  . Pressure injury of skin 09/02/2016  . Respiratory failure with hypoxia ([place]) 09/01/2016  . Lymphedema 08/16/2016  . Acute on chronic heart failure with preserved ejection fraction (HFpEF) ([place]) 08/09/2016  . Acquired lymphedema of leg 04/21/2016  . Congestive heart failure ([place]) 11/25/2015  . Nocturia 11/05/2015  . Urinary frequency 11/05/2015  . Acute respiratory failure with hypoxia ([place]) 09/05/2015  . 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 Incontinence 04/20/2015  . Diabetes mellitus, type 2 ([place]) 04/17/2015  . ESBL (extended spectrum beta-lactamase) producing bacteria infection 04/17/2015  . Hypertension 04/17/2015  . Frequent UTI 04/17/2015  . Absence of bladder continence 04/17/2015  . Iron deficiency anemia 03/08/2015  . Symptomatic anemia 09/25/2014  . Abdominal pain, lower 09/25/2014  . Urge incontinence 02/19/2013  . FOM (frequency of micturition)  02/19/2013  . Bladder infection, chronic 08/11/2012  . Difficult or painful urination 07/26/2012  . Lower urinary tract infection 12/31/2011  . Diabetes mellitus ([place]) 12/31/2011   PCP:  System, Provider Not In Pharmacy:   [place], [place] [place] [address] Phone: [phone] Fax: [phone]  [place], [place] [address] [place] [address] Phone: [phone] Fax: [phone]     Social Determinants of Health (SDOH) Interventions    Readmission Risk Interventions Readmission Risk Prevention Plan 03/19/2019  Transportation Screening Complete  Medication Review ([place]) Complete  PCP or Specialist appointment within 3-5 days of discharge Complete  HRI or [place] Complete  SW Recovery Care/Counseling Consult Not Complete  SW Consult Not Complete Comments na  [place] Not Applicable  Some recent data might be hidden

## 2019-07-03 NOTE — Progress Notes (Signed)
Patient ID: Carolyn Valdez, female   DOB: 1940-12-07, 78 y.o.   MRN: [id number]  [place] ZJQ:[id number] DOB: Apr 12, 1941 DOA: 07/02/2019 PCP: System, Provider Not In  HPI/Subjective: Patient states that she went to dialysis they got her out of the dialysis chair and the next thing she knows everybody was around her.  She feels dizzy and lightheaded and room spinning.  Feels weak.  She states that she is normally able to walk with help but does not do it too often.  Objective: Vitals:   07/03/19 0909 07/03/19 0943  BP:    Pulse: 67 62  Resp:    Temp:    SpO2:  98%    Intake/Output Summary (Last 24 hours) at 07/03/2019 1451 Last data filed at 07/03/2019 1003 Gross per 24 hour  Intake 360 ml  Output -  Net 360 ml   Filed Weights   07/02/19 1605 07/03/19 0003  Weight: 113.4 kg 117.7 kg    ROS: Review of Systems  Constitutional: Positive for malaise/fatigue. Negative for chills and fever.  Eyes: Negative for blurred vision.  Respiratory: Negative for cough, shortness of breath and wheezing.   Cardiovascular: Negative for chest pain.  Gastrointestinal: Negative for abdominal pain, constipation, diarrhea, nausea and vomiting.  Genitourinary: Negative for dysuria.  Musculoskeletal: Negative for joint pain.  Neurological: Positive for dizziness. Negative for headaches.   Exam: Physical Exam  Constitutional: She is oriented to person, place, and time.  HENT:  Nose: No mucosal edema.  Mouth/Throat: No oropharyngeal exudate or posterior oropharyngeal edema.  Eyes: Pupils are equal, round, and reactive to light. Conjunctivae, EOM and lids are normal.  Neck: No JVD present. Carotid bruit is not present. No edema present. No thyroid mass and no thyromegaly present.  Cardiovascular: S1 normal and S2 normal. Exam reveals no gallop.  No murmur heard. Pulses:      Dorsalis pedis pulses are 2+ on the right side and 2+ on the left side.  Respiratory: No  respiratory distress. She has no wheezes. She has no rhonchi. She has no rales.  GI: Soft. Bowel sounds are normal. There is no abdominal tenderness.  Musculoskeletal:     Right ankle: She exhibits swelling.     Left ankle: She exhibits swelling.  Lymphadenopathy:    She has no cervical adenopathy.  Neurological: She is alert and oriented to person, place, and time. No cranial nerve deficit.  Skin: Skin is warm. No rash noted. Nails show no clubbing.  Psychiatric: She has a normal mood and affect.      Data Reviewed: Basic Metabolic Panel: Recent Labs  Lab 07/02/19 1618 07/03/19 0635  NA 134* 133*  K 4.7 4.6  CL 97* 96*  CO2 21* 25  GLUCOSE 216* 177*  BUN 29* 37*  CREATININE 4.04* 5.03*  CALCIUM 8.9 9.1   Liver Function Tests: Recent Labs  Lab 07/02/19 1618 07/03/19 0635  AST 19 17  ALT 10 11  ALKPHOS 108 80  BILITOT 0.4 0.5  PROT 7.4 6.9  ALBUMIN 3.9 3.5   CBC: Recent Labs  Lab 07/02/19 1618 07/03/19 0635  WBC 14.0* 11.3*  NEUTROABS 10.7* 6.6  HGB 12.1 10.8*  HCT 39.6 35.7*  MCV 91.9 89.0  PLT 226 251   BNP (last 3 results) Recent Labs    08/03/18 2340 03/02/19 0530 07/02/19 1734  BNP 677.0* 867.0* 97.0     CBG: Recent Labs  Lab 07/03/19 0107 07/03/19 0506 07/03/19 0807 07/03/19  [place]    Recent Results (from the past 240 hour(s))  SARS Coronavirus 2 [facility] order, Performed in [facility] hospital lab) Nasopharyngeal Nasopharyngeal Swab     Status: None   Collection Time: 07/02/19  7:43 PM   Specimen: Nasopharyngeal Swab  Result Value Ref Range Status   SARS Coronavirus 2 NEGATIVE NEGATIVE Final    Comment: (NOTE) If result is NEGATIVE SARS-CoV-2 target nucleic acids are NOT DETECTED. The SARS-CoV-2 RNA is generally detectable in upper and lower  respiratory specimens during the acute phase of infection. The lowest  concentration of SARS-CoV-2 viral copies this assay can detect is 250  copies / mL. A  negative result does not preclude SARS-CoV-2 infection  and should not be used as the sole basis for treatment or other  patient management decisions.  A negative result may occur with  improper specimen collection / handling, submission of specimen other  than nasopharyngeal swab, presence of viral mutation(s) within the  areas targeted by this assay, and inadequate number of viral copies  (<250 copies / mL). A negative result must be combined with clinical  observations, patient history, and epidemiological information. If result is POSITIVE SARS-CoV-2 target nucleic acids are DETECTED. The SARS-CoV-2 RNA is generally detectable in upper and lower  respiratory specimens dur ing the acute phase of infection.  Positive  results are indicative of active infection with SARS-CoV-2.  Clinical  correlation with patient history and other diagnostic information is  necessary to determine patient infection status.  Positive results do  not rule out bacterial infection or co-infection with other viruses. If result is PRESUMPTIVE POSTIVE SARS-CoV-2 nucleic acids MAY BE PRESENT.   A presumptive positive result was obtained on the submitted specimen  and confirmed on repeat testing.  While 2019 novel coronavirus  (SARS-CoV-2) nucleic acids may be present in the submitted sample  additional confirmatory testing may be necessary for epidemiological  and / or clinical management purposes  to differentiate between  SARS-CoV-2 and other Sarbecovirus currently known to infect humans.  If clinically indicated additional testing with an alternate test  methodology [phone]) is advised. The SARS-CoV-2 RNA is generally  detectable in upper and lower respiratory sp ecimens during the acute  phase of infection. The expected result is Negative. Fact Sheet for Patients:  StrictlyIdeas.no Fact Sheet for Healthcare Providers: BankingDealers.co.za This test is not  yet approved or cleared by the [place] FDA and has been authorized for detection and/or diagnosis of SARS-CoV-2 by FDA under an Emergency Use Authorization (EUA).  This EUA will remain in effect (meaning this test can be used) for the duration of the COVID-19 declaration under Section 564(b)(1) of the Act, 21 U.S.C. section 360bbb-3(b)(1), unless the authorization is terminated or revoked sooner. Performed at [facility], [address] [place], [place] [address]   MRSA PCR Screening     Status: None   Collection Time: 07/03/19 12:31 AM   Specimen: Nasal Mucosa; Nasopharyngeal  Result Value Ref Range Status   MRSA by PCR NEGATIVE NEGATIVE Final    Comment:        The GeneXpert MRSA Assay (FDA approved for NASAL specimens only), is one component of a comprehensive MRSA colonization surveillance program. It is not intended to diagnose MRSA infection nor to guide or monitor treatment for MRSA infections. Performed at [facility], [address], [place], [place] [address]      Studies: Ct Head Wo Contrast  Result Date:  07/03/2019 CLINICAL DATA:  78 year old female with syncope. EXAM: CT HEAD WITHOUT CONTRAST TECHNIQUE: Contiguous axial images were obtained from the base of the skull through the vertex without intravenous contrast. COMPARISON:  Brain MRI 10/22/2016, head CT 07/04/2018. FINDINGS: Brain: Stable cerebral volume since 2019. No midline shift, ventriculomegaly, mass effect, evidence of mass lesion, intracranial hemorrhage or evidence of cortically based acute infarction. Stable gray-white matter differentiation throughout the brain. Patchy periventricular hypodensity similar to the 2018 MRI FLAIR signal changes. Vascular: Extensive Calcified atherosclerosis at the skull base. No suspicious intracranial vascular hyperdensity. Skull: No acute osseous abnormality identified. Sinuses/Orbits: Visualized paranasal sinuses and mastoids are stable and well  pneumatized. Other: No acute orbit or scalp soft tissue findings. There is some calcified scalp vessel atherosclerosis. IMPRESSION: No acute intracranial abnormality. Stable non contrast CT appearance of the brain since 2019. Electronically Signed   By: Genevie Ann M.D.   On: 07/03/2019 01:42   Dg Chest Portable 1 View  Result Date: 07/02/2019 CLINICAL DATA:  Syncope EXAM: PORTABLE CHEST 1 VIEW COMPARISON:  03/03/2019 FINDINGS: Right-sided central venous catheter tip over the right atrium. Cardiomegaly with central vascular congestion. Diffuse interstitial prominence likely due to chronic change. No overt edema. Aortic atherosclerosis. No consolidation or pneumothorax. IMPRESSION: Cardiomegaly with vascular congestion but no overt pulmonary edema. No focal airspace disease. Electronically Signed   By: Donavan Foil M.D.   On: 07/02/2019 16:22    Scheduled Meds: . atorvastatin  40 mg Oral q1800  . calcium acetate  1,334 mg Oral TID AC  . [START ON 07/04/2019] Chlorhexidine Gluconate Cloth  6 each Topical Q0600  . citalopram  10 mg Oral Daily  . docusate sodium  100 mg Oral BID  . ferric citrate  420 mg Oral TID WC  . fluticasone  1 spray Each Nare Daily  . heparin  5,000 Units Subcutaneous Q8H  . hydrALAZINE  25 mg Oral TID WC  . insulin aspart  0-5 Units Subcutaneous QHS  . insulin aspart  0-9 Units Subcutaneous TID WC  . insulin glargine  25 Units Subcutaneous QHS  . iron polysaccharides  150 mg Oral Daily  . lamoTRIgine  25 mg Oral Daily  . levETIRAcetam  250 mg Oral BID  . loratadine  10 mg Oral Daily  . meclizine  12.5 mg Oral TID  . Melatonin  5 mg Oral QHS  . metoCLOPramide  5 mg Oral TID AC  . metoprolol tartrate  50 mg Oral BID  . mirabegron ER  25 mg Oral Daily  . multivitamin with minerals  1 tablet Oral Daily  . nystatin  1 g Topical See admin instructions  . OLANZapine  5 mg Oral QHS  . pantoprazole  40 mg Oral Daily  . polyethylene glycol  17 g Oral Daily  . saccharomyces  boulardii  250 mg Oral Daily  . senna-docusate  1 tablet Oral QHS  . sodium chloride flush  3 mL Intravenous Q12H  . vitamin B-12  1,000 mcg Oral Daily    Assessment/Plan:  1. Syncope after dialysis.  Patient does complain of vertigo symptoms.  Trial of meclizine.  I ordered orthostatic vital signs and does not look like orthostatic from sitting to standing.  Physical therapy evaluation.  Continue to monitor. 2. Acute cystitis, leukocytosis and low-grade temperature.  Add on a urine culture.  Pharmacy to dose Levaquin in a dialysis patient. 3. End-stage renal disease.  Would like to see how she does with dialysis tomorrow. 4. Nausea.  Started a little Reglan.  Patient on Zofran already. 5. Type 2 diabetes mellitus on Lantus and sliding scale 6. Chronic diastolic congestive heart failure.  No signs of heart failure at this time.  Dialysis to manage fluid 7. Seizure disorder on Keppra 8. Hyperlipidemia on atorvastatin 9. Obesity.  Weight loss needed 10. Chronic respiratory failure on 3L  Code Status:     Code Status Orders  (From admission, onward)         Start     Ordered   07/02/19 2233  Full code  Continuous     07/02/19 2233        Code Status History    Date Active Date Inactive Code Status Order ID Comments User Context   03/02/2019 1210 03/20/2019 0026 Full Code [id number]  Vaughan Basta, MD Inpatient   08/04/2018 0556 08/07/2018 1951 DNR [id number]  Harrie Foreman, MD Inpatient   07/06/2018 1407 07/14/2018 1603 DNR [id number]  Vaughan Basta, MD Inpatient   07/04/2018 2200 07/06/2018 1407 Full Code [id number]  Demetrios Loll, MD Inpatient   04/22/2018 1702 04/26/2018 1851 DNR [id number]  Max Sane, MD Inpatient   02/21/2017 1629 02/24/2017 1747 DNR [id number]  Fritzi Mandes, MD Inpatient   01/31/2017 1617 02/04/2017 1350 DNR [id number]  Max Sane, MD Inpatient   01/23/2017 1320 01/31/2017 1617 Full Code [id number]  Henreitta Leber, MD Inpatient   10/05/2016 1216 10/26/2016  1740 DNR [id number]  Knox Royalty, NP Inpatient   09/24/2016 1731 10/05/2016 1216 Full Code [id number]  Gladstone Lighter, MD Inpatient   09/01/2016 0217 09/05/2016 1628 Full Code [id number]  Hugelmeyer, [place], DO Inpatient   08/09/2016 2027 08/12/2016 2015 Full Code [id number]  Henreitta Leber, MD Inpatient   09/30/2015 1423 10/07/2015 1737 Full Code [id number]  Loletha Grayer, MD ED   09/05/2015 1756 09/09/2015 1738 DNR [id number]  Loletha Grayer, MD ED   Advance Care Planning Activity    Advance Directive Documentation     Most Recent Value  Type of Advance Directive  Healthcare Power of Attorney, Living will  Pre-existing out of facility DNR order (yellow form or pink MOST form)  -  "MOST" Form in Place?  -     Family Communication: spoke with sister on the phone Disposition Plan: tbd  Consultants:  nephrology  Antibiotics:  levaquin  Time spent: 28 minutes  [place]

## 2019-07-03 NOTE — Progress Notes (Signed)
Pharmacy Antibiotic Note  Carolyn Valdez is a 78 y.o. female admitted on 07/02/2019 with UTI.  Pharmacy has been consulted for Levaquin dosing. Patient is ESRD on 2 times a week hemodialysis.  Plan: Levaquin 250mg  PO q48h  Height: 5\' 8"  (172.7 cm) Weight: 259 lb 8 oz (117.7 kg) IBW/kg (Calculated) : 63.9  Temp (24hrs), Avg:99.1 F (37.3 C), Min:97.7 F (36.5 C), Max:100.9 F (38.3 C)  Recent Labs  Lab 07/02/19 1618 07/03/19 0635  WBC 14.0* 11.3*  CREATININE 4.04* 5.03*    Estimated Creatinine Clearance: 12.8 mL/min (A) (by C-G formula based on SCr of 5.03 mg/dL (H)).    Allergies  Allergen Reactions  . Sulfa Antibiotics Hives  . Biaxin [Clarithromycin] Hives  . Influenza A (H1n1) Monoval Vac Other (See Comments)    Pt states that she was told by her MD not to get the influenza vaccine.    . Morphine Other (See Comments)    Reaction:  Dizziness and confusion   . Pyridium [Phenazopyridine Hcl] Other (See Comments)    Reaction:  Unknown   . Ceftriaxone Anxiety  . Latex Rash  . Prednisone Rash  . Tape Rash    Thank you for allowing pharmacy to be a part of this patient's care.  Paulina Fusi, PharmD, BCPS 07/03/2019 3:14 PM

## 2019-07-03 NOTE — Progress Notes (Signed)
eeg completed ° °

## 2019-07-04 ENCOUNTER — Other Ambulatory Visit (INDEPENDENT_AMBULATORY_CARE_PROVIDER_SITE_OTHER): Payer: Self-pay | Admitting: Vascular Surgery

## 2019-07-04 LAB — URINE CULTURE

## 2019-07-04 LAB — GLUCOSE, CAPILLARY
Glucose-Capillary: 145 mg/dL — ABNORMAL HIGH (ref 70–99)
Glucose-Capillary: 223 mg/dL — ABNORMAL HIGH (ref 70–99)
Glucose-Capillary: 230 mg/dL — ABNORMAL HIGH (ref 70–99)

## 2019-07-04 LAB — HEMOGLOBIN A1C
Hgb A1c MFr Bld: 7.7 % — ABNORMAL HIGH (ref 4.8–5.6)
Mean Plasma Glucose: 174 mg/dL

## 2019-07-04 MED ORDER — HEPARIN SODIUM (PORCINE) 1000 UNIT/ML DIALYSIS
20.0000 [IU]/kg | INTRAMUSCULAR | Status: DC | PRN
  Filled 2019-07-04: qty 3

## 2019-07-04 NOTE — Consult Note (Signed)
[facility] Admission History & Physical  MRN : [id number]  Carolyn Valdez is a 78 y.o. ([birth year]) female who presents with chief complaint of  Chief Complaint  Patient presents with  . Loss of Consciousness   History of Present Illness:  I am asked to evaluate the patient by the Dr. Candiss Norse because dialysis staff were unable to cannulate the patients Permcath this morning.   The patient presented to [facility] emergency department via EMS from her outpatient dialysis treatment center status post syncopal episode.  Patient experienced a loss of consciousness and as per dialysis staff was found to be cyanotic and unresponsive approximately one minute.  The patient does not recall passing out.  Patient denies experiencing any symptoms before LOC.  She denies any shortness of breath or chest pain.  Denies any fever, nausea vomiting.  Current Facility-Administered Medications  Medication Dose Route Frequency Provider Last Rate Last Dose  . acetaminophen (TYLENOL) tablet 650 mg  650 mg Oral Q6H PRN Lang Snow, NP   650 mg at 07/03/19 0311  . albuterol (PROVENTIL) (2.5 MG/3ML) 0.083% nebulizer solution 2.5 mg  2.5 mg Inhalation Q6H PRN Lang Snow, NP      . atorvastatin (LIPITOR) tablet 40 mg  40 mg Oral q1800 Lang Snow, NP   40 mg at 07/03/19 1729  . calcium acetate (PHOSLO) capsule 1,334 mg  1,334 mg Oral TID AC Lang Snow, NP   1,334 mg at 07/04/19 1405  . camphor-menthol (SARNA) lotion 1 application  1 application Topical Q3R PRN Lang Snow, NP      . Chlorhexidine Gluconate Cloth 2 % PADS 6 each  6 each Topical Q0600 Murlean Iba, MD   6 each at 07/04/19 0654  . citalopram (CELEXA) tablet 10 mg  10 mg Oral Daily Lang Snow, NP   10 mg at 07/04/19 1407  . clonazepam (KLONOPIN) disintegrating tablet 0.25 mg  0.25 mg Oral Q12H PRN Lang Snow, NP   0.25 mg at  07/03/19 2149  . docusate sodium (COLACE) capsule 100 mg  100 mg Oral BID Lang Snow, NP   100 mg at 07/03/19 2149  . ferric citrate (AURYXIA) tablet 420 mg  420 mg Oral TID WC Lang Snow, NP   420 mg at 07/04/19 1405  . fluticasone (FLONASE) 50 MCG/ACT nasal spray 1 spray  1 spray Each Nare Daily Lang Snow, NP   1 spray at 07/04/19 1403  . heparin injection 5,000 Units  5,000 Units Subcutaneous Q8H Lang Snow, NP   5,000 Units at 07/04/19 1406  . hydrALAZINE (APRESOLINE) tablet 25 mg  25 mg Oral TID WC Lang Snow, NP   25 mg at 07/04/19 1407  . insulin aspart (novoLOG) injection 0-5 Units  0-5 Units Subcutaneous QHS Lang Snow, NP   2 Units at 07/03/19 2152  . insulin aspart (novoLOG) injection 0-9 Units  0-9 Units Subcutaneous TID WC Lang Snow, NP   1 Units at 07/04/19 1403  . insulin glargine (LANTUS) injection 25 Units  25 Units Subcutaneous QHS Lang Snow, NP   25 Units at 07/03/19 2151  . iron polysaccharides (NIFEREX) capsule 150 mg  150 mg Oral Daily Lang Snow, NP   150 mg at 07/04/19 1404  . lamoTRIgine (LAMICTAL) tablet 25 mg  25 mg Oral Daily Lang Snow, NP   25 mg at 07/04/19 1404  . levETIRAcetam (  KEPPRA) tablet 250 mg  250 mg Oral BID Lang Snow, NP   250 mg at 07/04/19 1406  . loratadine (CLARITIN) tablet 10 mg  10 mg Oral Daily Lang Snow, NP   10 mg at 07/04/19 1406  . meclizine (ANTIVERT) tablet 12.5 mg  12.5 mg Oral TID Loletha Grayer, MD   12.5 mg at 07/04/19 1404  . Melatonin TABS 5 mg  5 mg Oral QHS Lang Snow, NP   5 mg at 07/03/19 2150  . metoCLOPramide (REGLAN) tablet 5 mg  5 mg Oral TID AC Wieting, Richard, MD   5 mg at 07/04/19 1407  . metoprolol tartrate (LOPRESSOR) tablet 50 mg  50 mg Oral BID Lang Snow, NP   50 mg at 07/03/19 2149  . mirabegron ER (MYRBETRIQ) tablet 25 mg  25 mg Oral  Daily Lang Snow, NP   25 mg at 07/04/19 1404  . multivitamin with minerals tablet 1 tablet  1 tablet Oral Daily Lang Snow, NP   1 tablet at 07/04/19 1407  . nystatin (MYCOSTATIN/NYSTOP) topical powder 1 g  1 g Topical See admin instructions Lang Snow, NP      . OLANZapine (ZYPREXA) tablet 5 mg  5 mg Oral QHS Lang Snow, NP   5 mg at 07/03/19 2151  . ondansetron (ZOFRAN-ODT) disintegrating tablet 8 mg  8 mg Oral Q8H PRN Lang Snow, NP   8 mg at 07/03/19 2233  . oxyCODONE-acetaminophen (PERCOCET/ROXICET) 5-325 MG per tablet 1 tablet  1 tablet Oral Q6H PRN Lang Snow, NP   1 tablet at 07/04/19 1417  . pantoprazole (PROTONIX) EC tablet 40 mg  40 mg Oral Daily Lang Snow, NP   40 mg at 07/04/19 1406  . polyethylene glycol (MIRALAX / GLYCOLAX) packet 17 g  17 g Oral Daily Lang Snow, NP   17 g at 07/03/19 0908  . saccharomyces boulardii (FLORASTOR) capsule 250 mg  250 mg Oral Daily Lang Snow, NP   250 mg at 07/04/19 1406  . senna-docusate (Senokot-S) tablet 1 tablet  1 tablet Oral QHS Lang Snow, NP   1 tablet at 07/03/19 2149  . sodium chloride flush (NS) 0.9 % injection 3 mL  3 mL Intravenous Q12H Lang Snow, NP   3 mL at 07/03/19 2130  . tiZANidine (ZANAFLEX) tablet 2 mg  2 mg Oral Q8H PRN Lang Snow, NP      . Uribel 118 MG CAPS 118 mg  118 mg Oral Q6H PRN Lang Snow, NP      . vitamin B-12 (CYANOCOBALAMIN) tablet 1,000 mcg  1,000 mcg Oral Daily Lang Snow, NP   1,000 mcg at 07/04/19 1406   Facility-Administered Medications Ordered in Other Encounters  Medication Dose Route Frequency Provider Last Rate Last Dose  . 0.9 %  sodium chloride infusion    Continuous PRN Dionne Bucy, CRNA       Past Medical History:  Diagnosis Date  . (HFpEF) heart failure with preserved ejection fraction ([place])    a. 06/2018 Echo: EF  60-65%, no rwma. Gr2 DD. Mild LVH. Ao sclerosis w/o stenosis. Mild MR. Mild to mod LAE. Nl RV fxn. Mild RAE. Mod TR. PASP 60-85mmHg.  Marland Kitchen Anxiety   . Breast cancer ([place]) 1997   a. 1997 s/p right breast mastectomy  . Chest pain    a. 11/2008 MV: No ischemia. EF 66%.  . CKD (chronic kidney disease)  stage 4, GFR 15-29 ml/min (H[facility])   . Coronary artery calcification seen on CT scan    a. 01/2017 Chest CT: Atherosclerotic coronary Ca2+.  . Diabetes mellitus without complication ([place])   . DJD (degenerative joint disease)   . Dysuria   . GIB (gastrointestinal bleeding)    a. 07/2018 EGD: Gastritis w/ hemorrhage, duodenal angioectasia. Friable gastric mucosa w/ bleeding on contact. All treated w/ argon plasma coagulation; b. 07/2018 s/p 2u PRBCs.  . H/O total knee replacement    right  . HTN (hypertension)   . Hypercholesteremia   . Kidney stone   . Obesity   . Recurrent urinary tract infection   . SVT (supraventricular tachycardia) ([place])   . Urinary incontinence   . Vaginal atrophy   . Yeast vaginitis    Past Surgical History:  Procedure Laterality Date  . CARDIAC CATHETERIZATION    . CHOLECYSTECTOMY    . COLONOSCOPY N/A 08/06/2018   Procedure: COLONOSCOPY;  Surgeon: Virgel Manifold, MD;  Location: [facility] ENDOSCOPY;  Service: Endoscopy;  Laterality: N/A;  . DIALYSIS/PERMA CATHETER INSERTION N/A 03/16/2019   Procedure: DIALYSIS/PERMA CATHETER INSERTION;  Surgeon: Algernon Huxley, MD;  Location: [facility];  Service: Cardiovascular;  Laterality: N/A;  . DIALYSIS/PERMA CATHETER INSERTION N/A 05/29/2019   Procedure: DIALYSIS/PERMA CATHETER INSERTION;  Surgeon: Katha Cabal, MD;  Location: [facility];  Service: Cardiovascular;  Laterality: N/A;  . ESOPHAGOGASTRODUODENOSCOPY N/A 08/06/2018   Procedure: ESOPHAGOGASTRODUODENOSCOPY (EGD);  Surgeon: Virgel Manifold, MD;  Location: [facility] ENDOSCOPY;  Service: Endoscopy;  Laterality: N/A;  . FOOT SURGERY    . JOINT  REPLACEMENT    . MASTECTOMY Right 1997  . NASAL SEPTUM SURGERY    . PERIPHERAL VASCULAR CATHETERIZATION N/A 07/08/2015   Procedure: PICC Line Insertion;  Surgeon: Algernon Huxley, MD;  Location: [facility];  Service: Cardiovascular;  Laterality: N/A;  . TEMPORARY DIALYSIS CATHETER N/A 03/09/2019   Procedure: TEMPORARY DIALYSIS CATHETER INSERTION;  Surgeon: Algernon Huxley, MD;  Location: [facility];  Service: Cardiovascular;  Laterality: N/A;  . TONSILLECTOMY    . Vocal cords     Social History Social History   Tobacco Use  . Smoking status: Former Research scientist (life sciences)  . Smokeless tobacco: Former Systems developer    Quit date: 10/29/1995  Substance Use Topics  . Alcohol use: No    Alcohol/week: 0.0 standard drinks  . Drug use: No   Family History Family History  Problem Relation Age of Onset  . Lung cancer Father   . Hematuria Mother   . Lung cancer Mother   . Kidney disease Neg Hx   . Bladder Cancer Neg Hx   . Breast cancer Neg Hx   No family history of bleeding or clotting disorders, autoimmune disease or porphyria  Allergies  Allergen Reactions  . Sulfa Antibiotics Hives  . Biaxin [Clarithromycin] Hives  . Influenza A (H1n1) Monoval Vac Other (See Comments)    Pt states that she was told by her MD not to get the influenza vaccine.    . Morphine Other (See Comments)    Reaction:  Dizziness and confusion   . Pyridium [Phenazopyridine Hcl] Other (See Comments)    Reaction:  Unknown   . Ceftriaxone Anxiety  . Latex Rash  . Prednisone Rash  . Tape Rash   REVIEW OF SYSTEMS (Negative unless checked)  Constitutional: [] Weight loss  [] Fever  [] Chills Cardiac: [] Chest pain   [] Chest pressure   [] Palpitations   [] Shortness  of breath when laying flat   [] Shortness of breath at rest   [x] Shortness of breath with exertion. Vascular:  [] Pain in legs with walking   [] Pain in legs at rest   [] Pain in legs when laying flat   [] Claudication   [] Pain in feet when walking  [] Pain in feet at rest   [] Pain in feet when laying flat   [] History of DVT   [] Phlebitis   [x] Swelling in legs   [] Varicose veins   [] Non-healing ulcers Pulmonary:   [] Uses home oxygen   [] Productive cough   [] Hemoptysis   [] Wheeze  [] COPD   [] Asthma Neurologic:  [] Dizziness  [x] Blackouts   [] Seizures   [] History of stroke   [] History of TIA  [] Aphasia   [] Temporary blindness   [] Dysphagia   [] Weakness or numbness in arms   [] Weakness or numbness in legs Musculoskeletal:  [] Arthritis   [] Joint swelling   [] Joint pain   [] Low back pain Hematologic:  [] Easy bruising  [] Easy bleeding   [] Hypercoagulable state   [] Anemic  [] Hepatitis Gastrointestinal:  [] Blood in stool   [] Vomiting blood  [] Gastroesophageal reflux/heartburn   [] Difficulty swallowing. Genitourinary:  [x] Chronic kidney disease   [] Difficult urination  [] Frequent urination  [] Burning with urination   [] Blood in urine Skin:  [] Rashes   [] Ulcers   [] Wounds Psychological:  [] History of anxiety   []  History of major depression.  Physical Examination  Vitals:   07/04/19 1115 07/04/19 1130 07/04/19 1329 07/04/19 1549  BP: (!) 131/42 (!) 117/53 (!) 145/42 (!) 123/37  Pulse: (!) 47 (!) 46 (!) 55 (!) 59  Resp: 20 18  18   Temp:   98.7 F (37.1 C) 98.1 F (36.7 C)  TempSrc:   Oral Oral  SpO2: 100% 100% 100% 100%  Weight:   119.5 kg   Height:       Body mass index is 40.05 kg/m. Gen: WD/WN, NAD Head: [place]/AT, No temporalis wasting. Prominent temp pulse not noted. Ear/Nose/Throat: Hearing grossly intact, nares w/o erythema or drainage, oropharynx w/o Erythema/Exudate,  Eyes: Conjunctiva clear, sclera non-icteric Neck: Trachea midline.  No JVD.  Chest: PermCath intact, clean and dry. Pulmonary:  Good air movement, respirations not labored, no use of accessory muscles.  Cardiac: RRR, normal S1, S2. Vascular:  Vessel Right Left  Radial Palpable Palpable  Ulnar Not Palpable Not Palpable  Brachial Palpable Palpable  Carotid Palpable, without bruit Palpable,  without bruit  Gastrointestinal: soft, non-tender/non-distended. No guarding/reflex.  Musculoskeletal: M/S 5/5 throughout.  Extremities without ischemic changes.  No deformity or atrophy.  Neurologic: Sensation grossly intact in extremities.  Symmetrical.  Speech is fluent. Motor exam as listed above. Psychiatric: Judgment intact, Mood & affect appropriate for pt's clinical situation. Dermatologic: No rashes or ulcers noted.  No cellulitis or open wounds. Lymph : No Cervical, Axillary, or Inguinal lymphadenopathy.  CBC Lab Results  Component Value Date   WBC 11.3 (H) 07/03/2019   HGB 10.8 (L) 07/03/2019   HCT 35.7 (L) 07/03/2019   MCV 89.0 07/03/2019   PLT 251 07/03/2019   BMET    Component Value Date/Time   NA 133 (L) 07/03/2019 0635   NA 138 09/28/2018 1517   NA 137 09/08/2014 1042   K 4.6 07/03/2019 0635   K 5.2 (H) 09/08/2014 1042   CL 96 (L) 07/03/2019 0635   CL 103 09/08/2014 1042   CO2 25 07/03/2019 0635   CO2 28 09/08/2014 1042   GLUCOSE 177 (H) 07/03/2019 0635   GLUCOSE 211 (H) 09/08/2014  1042   BUN 37 (H) 07/03/2019 0635   BUN 33 (H) 09/28/2018 1517   BUN 16 09/08/2014 1042   CREATININE 5.03 (H) 07/03/2019 0635   CREATININE 1.45 (H) 09/08/2014 1042   CALCIUM 9.1 07/03/2019 0635   CALCIUM 8.3 (L) 09/08/2014 1042   GFRNONAA 8 (L) 07/03/2019 0635   GFRNONAA 38 (L) 09/08/2014 1042   GFRNONAA 45 (L) 07/09/2014 1631   GFRAA 9 (L) 07/03/2019 0635   GFRAA 46 (L) 09/08/2014 1042   GFRAA 52 (L) 07/09/2014 1631   Estimated Creatinine Clearance: 12.9 mL/min (A) (by C-G formula based on SCr of 5.03 mg/dL (H)).  COAG Lab Results  Component Value Date   INR 1.1 09/28/2018   INR 1.56 07/05/2018   INR 1.2 08/23/2013   Radiology Ct Head Wo Contrast  Result Date: 07/03/2019 CLINICAL DATA:  78 year old female with syncope. EXAM: CT HEAD WITHOUT CONTRAST TECHNIQUE: Contiguous axial images were obtained from the base of the skull through the vertex without intravenous  contrast. COMPARISON:  Brain MRI 10/22/2016, head CT 07/04/2018. FINDINGS: Brain: Stable cerebral volume since 2019. No midline shift, ventriculomegaly, mass effect, evidence of mass lesion, intracranial hemorrhage or evidence of cortically based acute infarction. Stable gray-white matter differentiation throughout the brain. Patchy periventricular hypodensity similar to the 2018 MRI FLAIR signal changes. Vascular: Extensive Calcified atherosclerosis at the skull base. No suspicious intracranial vascular hyperdensity. Skull: No acute osseous abnormality identified. Sinuses/Orbits: Visualized paranasal sinuses and mastoids are stable and well pneumatized. Other: No acute orbit or scalp soft tissue findings. There is some calcified scalp vessel atherosclerosis. IMPRESSION: No acute intracranial abnormality. Stable non contrast CT appearance of the brain since 2019. Electronically Signed   By: Genevie Ann M.D.   On: 07/03/2019 01:42   Dg Chest Portable 1 View  Result Date: 07/02/2019 CLINICAL DATA:  Syncope EXAM: PORTABLE CHEST 1 VIEW COMPARISON:  03/03/2019 FINDINGS: Right-sided central venous catheter tip over the right atrium. Cardiomegaly with central vascular congestion. Diffuse interstitial prominence likely due to chronic change. No overt edema. Aortic atherosclerosis. No consolidation or pneumothorax. IMPRESSION: Cardiomegaly with vascular congestion but no overt pulmonary edema. No focal airspace disease. Electronically Signed   By: Donavan Foil M.D.   On: 07/02/2019 16:22   Assessment/Plan 1.  Complication dialysis device with thrombosis AV access:   The patient has been placed on our schedule to undergo a TPA infusion into her PermCath tomorrow a.m.  Procedure, risks and benefits been to the patient.  All questions answered.  The patient wishes to proceed. 2.  Hypertension:  Patient will continue medical management; nephrology is following no changes in oral medications. 3. Diabetes mellitus:  Glucose  will be monitored and oral medications been held this morning once the patient has undergone the patient's procedure po intake will be reinitiated and again Accu-Cheks will be used to assess the blood glucose level and treat as needed. The patient will be restarted on the patient's usual hypoglycemic regime 4.  Coronary artery disease:  EKG will be monitored. Nitrates will be used if needed. The patient's oral cardiac medications will be continued.  Discussed with Dr. Mayme Genta, PA-C  07/04/2019 3:55 PM

## 2019-07-04 NOTE — Progress Notes (Signed)
HD Tx Initiated:     07/04/19 0904  Vital Signs  Temp 98.8 F (37.1 C)  Temp Source Oral  Pulse Rate (!) 50  Pulse Rate Source Dinamap  Resp 15  BP (!) 139/43  BP Location Left Arm  BP Method Automatic  Patient Position (if appropriate) Lying  Oxygen Therapy  SpO2 95 %  O2 Device Nasal Cannula  O2 Flow Rate (L/min) 3 L/min  Pain Assessment  Pain Scale 0-10  Pain Score 0  Dialysis Weight  Weight 120 kg  Type of Weight Pre-Dialysis  During Hemodialysis Assessment  Blood Flow Rate (mL/min) 400 mL/min  Arterial Pressure (mmHg) -210 mmHg  Venous Pressure (mmHg) 120 mmHg  Transmembrane Pressure (mmHg) 50 mmHg  Ultrafiltration Rate (mL/min) 430 mL/min  Dialysate Flow Rate (mL/min) 600 ml/min  Conductivity: Machine  14  HD Safety Checks Performed Yes  Dialysis Fluid Bolus Normal Saline  Bolus Amount (mL) 250 mL  Intra-Hemodialysis Comments Tx initiated

## 2019-07-04 NOTE — Progress Notes (Signed)
[facility], [place] 07/04/19  Subjective:   LOS: 1 09/15 0701 - 09/16 0700 In: 360 [P.O.:360] Out: -  Patient known to our practice from outpatient dialysis Presents for witnessed syncope postdialysis EEG is neg for Sz activitiy Seen during HD. Tolerating well. Good appetite     Objective:  Vital signs in last 24 hours:  Temp:  [97.8 F (36.6 C)-98.5 F (36.9 C)] 97.9 F (36.6 C) (09/16 0340) Pulse Rate:  [49-68] 49 (09/16 0340) Resp:  [17-20] 20 (09/16 0340) BP: (134-152)/(42-51) 152/50 (09/16 0340) SpO2:  [98 %-100 %] 100 % (09/16 0340) Weight:  [702 kg] 117 kg (09/16 0500)  Weight change: 3.601 kg Filed Weights   07/02/19 1605 07/03/19 0003 07/04/19 0500  Weight: 113.4 kg 117.7 kg 117 kg    Intake/Output:    Intake/Output Summary (Last 24 hours) at 07/04/2019 0842 Last data filed at 07/03/2019 1003 Gross per 24 hour  Intake 360 ml  Output -  Net 360 ml    Gen:   Alert, cooperative, no distress, laying in bed Head:   Normocephalic, without obvious abnormality, atraumatic Eyes/ENT:  conjunctiva/corneas clear,  moist oral mucus membranes Neck:  Supple,  thyroid: not enlarged, no JVD Lungs:   Clear to auscultation bilaterally, respirations unlabored Heart:   Regular rate and rhythm Abdomen:   Soft, non-tender,  Extremities: no cyanosis or edema Skin:  Skin color, texture, turgor normal, no rashes or lesions Neurologic: Alert and oriented, able to answer questions appropriately Rt IJ PC   Basic Metabolic Panel:  Recent Labs  Lab 07/02/19 1618 07/03/19 0635  NA 134* 133*  K 4.7 4.6  CL 97* 96*  CO2 21* 25  GLUCOSE 216* 177*  BUN 29* 37*  CREATININE 4.04* 5.03*  CALCIUM 8.9 9.1     CBC: Recent Labs  Lab 07/02/19 1618 07/03/19 0635  WBC 14.0* 11.3*  NEUTROABS 10.7* 6.6  HGB 12.1 10.8*  HCT 39.6 35.7*  MCV 91.9 89.0  PLT 226 251      Lab Results  Component Value Date   HEPBSAG Negative 03/09/2019    HEPBSAB Non Reactive 03/09/2019      Microbiology:  Recent Results (from the past 240 hour(s))  SARS Coronavirus 2 [facility] order, Performed in [facility] hospital lab) Nasopharyngeal Nasopharyngeal Swab     Status: None   Collection Time: 07/02/19  7:43 PM   Specimen: Nasopharyngeal Swab  Result Value Ref Range Status   SARS Coronavirus 2 NEGATIVE NEGATIVE Final    Comment: (NOTE) If result is NEGATIVE SARS-CoV-2 target nucleic acids are NOT DETECTED. The SARS-CoV-2 RNA is generally detectable in upper and lower  respiratory specimens during the acute phase of infection. The lowest  concentration of SARS-CoV-2 viral copies this assay can detect is 250  copies / mL. A negative result does not preclude SARS-CoV-2 infection  and should not be used as the sole basis for treatment or other  patient management decisions.  A negative result may occur with  improper specimen collection / handling, submission of specimen other  than nasopharyngeal swab, presence of viral mutation(s) within the  areas targeted by this assay, and inadequate number of viral copies  (<250 copies / mL). A negative result must be combined with clinical  observations, patient history, and epidemiological information. If result is POSITIVE SARS-CoV-2 target nucleic acids are DETECTED. The SARS-CoV-2 RNA is generally detectable in upper and lower  respiratory specimens dur ing the acute phase of infection.  Positive  results are indicative of active infection with SARS-CoV-2.  Clinical  correlation with patient history and other diagnostic information is  necessary to determine patient infection status.  Positive results do  not rule out bacterial infection or co-infection with other viruses. If result is PRESUMPTIVE POSTIVE SARS-CoV-2 nucleic acids MAY BE PRESENT.   A presumptive positive result was obtained on the submitted specimen  and confirmed on repeat testing.  While 2019 novel coronavirus   (SARS-CoV-2) nucleic acids may be present in the submitted sample  additional confirmatory testing may be necessary for epidemiological  and / or clinical management purposes  to differentiate between  SARS-CoV-2 and other Sarbecovirus currently known to infect humans.  If clinically indicated additional testing with an alternate test  methodology [phone]) is advised. The SARS-CoV-2 RNA is generally  detectable in upper and lower respiratory sp ecimens during the acute  phase of infection. The expected result is Negative. Fact Sheet for Patients:  StrictlyIdeas.no Fact Sheet for Healthcare Providers: BankingDealers.co.za This test is not yet approved or cleared by the [place] FDA and has been authorized for detection and/or diagnosis of SARS-CoV-2 by FDA under an Emergency Use Authorization (EUA).  This EUA will remain in effect (meaning this test can be used) for the duration of the COVID-19 declaration under Section 564(b)(1) of the Act, 21 U.S.C. section 360bbb-3(b)(1), unless the authorization is terminated or revoked sooner. Performed at [facility], [address] [place], [place] [address]   MRSA PCR Screening     Status: None   Collection Time: 07/03/19 12:31 AM   Specimen: Nasal Mucosa; Nasopharyngeal  Result Value Ref Range Status   MRSA by PCR NEGATIVE NEGATIVE Final    Comment:        The GeneXpert MRSA Assay (FDA approved for NASAL specimens only), is one component of a comprehensive MRSA colonization surveillance program. It is not intended to diagnose MRSA infection nor to guide or monitor treatment for MRSA infections. Performed at [facility], [address] [place], [place] [address]     Coagulation Studies: No results for input(s): LABPROT, INR in the last 72 hours.  Urinalysis: Recent Labs    07/03/19 0600  COLORURINE AMBER*  LABSPEC 1.020  PHURINE 5.0  GLUCOSEU  NEGATIVE  HGBUR NEGATIVE  BILIRUBINUR NEGATIVE  KETONESUR NEGATIVE  PROTEINUR 100*  NITRITE NEGATIVE  LEUKOCYTESUR LARGE*      Imaging: Ct Head Wo Contrast  Result Date: 07/03/2019 CLINICAL DATA:  78 year old female with syncope. EXAM: CT HEAD WITHOUT CONTRAST TECHNIQUE: Contiguous axial images were obtained from the base of the skull through the vertex without intravenous contrast. COMPARISON:  Brain MRI 10/22/2016, head CT 07/04/2018. FINDINGS: Brain: Stable cerebral volume since 2019. No midline shift, ventriculomegaly, mass effect, evidence of mass lesion, intracranial hemorrhage or evidence of cortically based acute infarction. Stable gray-white matter differentiation throughout the brain. Patchy periventricular hypodensity similar to the 2018 MRI FLAIR signal changes. Vascular: Extensive Calcified atherosclerosis at the skull base. No suspicious intracranial vascular hyperdensity. Skull: No acute osseous abnormality identified. Sinuses/Orbits: Visualized paranasal sinuses and mastoids are stable and well pneumatized. Other: No acute orbit or scalp soft tissue findings. There is some calcified scalp vessel atherosclerosis. IMPRESSION: No acute intracranial abnormality. Stable non contrast CT appearance of the brain since 2019. Electronically Signed   By: Genevie Ann M.D.   On: 07/03/2019 01:42   Dg Chest Portable 1 View  Result Date: 07/02/2019 CLINICAL DATA:  Syncope EXAM: PORTABLE CHEST 1 VIEW COMPARISON:  03/03/2019 FINDINGS: Right-sided central venous catheter tip over the right atrium. Cardiomegaly with central vascular congestion. Diffuse interstitial prominence likely due to chronic change. No overt edema. Aortic atherosclerosis. No consolidation or pneumothorax. IMPRESSION: Cardiomegaly with vascular congestion but no overt pulmonary edema. No focal airspace disease. Electronically Signed   By: Donavan Foil M.D.   On: 07/02/2019 16:22     Medications:    . atorvastatin  40 mg Oral  q1800  . calcium acetate  1,334 mg Oral TID AC  . Chlorhexidine Gluconate Cloth  6 each Topical Q0600  . citalopram  10 mg Oral Daily  . docusate sodium  100 mg Oral BID  . ferric citrate  420 mg Oral TID WC  . fluticasone  1 spray Each Nare Daily  . heparin  5,000 Units Subcutaneous Q8H  . hydrALAZINE  25 mg Oral TID WC  . insulin aspart  0-5 Units Subcutaneous QHS  . insulin aspart  0-9 Units Subcutaneous TID WC  . insulin glargine  25 Units Subcutaneous QHS  . iron polysaccharides  150 mg Oral Daily  . lamoTRIgine  25 mg Oral Daily  . levETIRAcetam  250 mg Oral BID  . levofloxacin  250 mg Oral Q48H  . loratadine  10 mg Oral Daily  . meclizine  12.5 mg Oral TID  . Melatonin  5 mg Oral QHS  . metoCLOPramide  5 mg Oral TID AC  . metoprolol tartrate  50 mg Oral BID  . mirabegron ER  25 mg Oral Daily  . multivitamin with minerals  1 tablet Oral Daily  . nystatin  1 g Topical See admin instructions  . OLANZapine  5 mg Oral QHS  . pantoprazole  40 mg Oral Daily  . polyethylene glycol  17 g Oral Daily  . saccharomyces boulardii  250 mg Oral Daily  . senna-docusate  1 tablet Oral QHS  . sodium chloride flush  3 mL Intravenous Q12H  . vitamin B-12  1,000 mcg Oral Daily   acetaminophen, albuterol, camphor-menthol, clonazePAM, heparin, ondansetron, oxyCODONE-acetaminophen, tiZANidine, Uribel  Assessment/ Plan:  78 y.o. female with congestive heart failure, hypertension, diabetes type 2, obstructive sleep apnea, nephrolithiasis.  Started chronic dialysis June 2020  Active Problems:   Syncope   Vertigo  [facility] [address]/120 kg/ MWF/240 min  #.  End-stage renal disease Recent Labs    07/02/19 1618 07/03/19 0635  CREATININE 4.04* 5.03*  patient seen during HD treatment tomorrow Standing post-dialysis weight today to make recommendations for outpatient Heparin bolus 2000 units; drip 500 u/hr as needed  #. Anemia of CKD  Lab Results  Component Value Date   HGB 10.8 (L)  07/03/2019  Continue EPO with hemodialysis  #. [facility]     Component Value Date/Time   PTH 51 03/04/2019 0507   Lab Results  Component Value Date   PHOS 3.0 03/13/2019  Monitor phosphorus  #Syncope - neurology evaluation ongoing ? May be related to excessive ultrafiltration Outpatient dialysis target weight will need to be increased ? 121-122 kg  #. Diabetes type 2 with CKD Hemoglobin A1C (%)  Date Value  09/04/2014 6.2   Hgb A1c MFr Bld (%)  Date Value  07/03/2019 7.7 (H)     LOS: [place] 9/16/20208:42 AM  [place], [place]

## 2019-07-04 NOTE — Progress Notes (Signed)
Patient ID: Carolyn Valdez, female   DOB: 1941/02/08, 78 y.o.   MRN: [id number]  Carolyn Valdez FUX:[id number] DOB: 03-Dec-1940 DOA: 07/02/2019 PCP: System, Provider Not In  HPI/Subjective: Patient stated with dialysis today the nurse had to keep coming to evaluate her because the machine was beeping and had a keep on adjusting the catheter.  Patient still feels tired and fatigued.  She had a passout episode after dialysis on Monday as outpatient.  Objective: Vitals:   07/04/19 1439 07/04/19 1549  BP:  (!) 123/37  Pulse: 67 (!) 59  Resp:  18  Temp:  98.1 F (36.7 C)  SpO2: 99% 100%    Intake/Output Summary (Last 24 hours) at 07/04/2019 1627 Last data filed at 07/04/2019 1245 Gross per 24 hour  Intake -  Output 1000 ml  Net -1000 ml   Filed Weights   07/04/19 1234 07/04/19 1245 07/04/19 1329  Weight: 120 kg 120 kg 119.5 kg    ROS: Review of Systems  Constitutional: Positive for malaise/fatigue. Negative for chills and fever.  Eyes: Negative for blurred vision.  Respiratory: Negative for cough, shortness of breath and wheezing.   Cardiovascular: Negative for chest pain.  Gastrointestinal: Negative for abdominal pain, constipation, diarrhea, nausea and vomiting.  Genitourinary: Negative for dysuria.  Musculoskeletal: Negative for joint pain.  Neurological: Positive for dizziness. Negative for headaches.   Exam: Physical Exam  Constitutional: She is oriented to person, place, and time.  HENT:  Nose: No mucosal edema.  Mouth/Throat: No oropharyngeal exudate or posterior oropharyngeal edema.  Eyes: Pupils are equal, round, and reactive to light. Conjunctivae, EOM and lids are normal.  Neck: No JVD present. Carotid bruit is not present. No edema present. No thyroid mass and no thyromegaly present.  Cardiovascular: S1 normal and S2 normal. Exam reveals no gallop.  No murmur heard. Pulses:      Dorsalis pedis pulses are 2+ on the right side and 2+ on  the left side.  Respiratory: No respiratory distress. She has no wheezes. She has no rhonchi. She has no rales.  GI: Soft. Bowel sounds are normal. There is no abdominal tenderness.  Musculoskeletal:     Right ankle: She exhibits swelling.     Left ankle: She exhibits swelling.  Lymphadenopathy:    She has no cervical adenopathy.  Neurological: She is alert and oriented to person, place, and time. No cranial nerve deficit.  Skin: Skin is warm. No rash noted. Nails show no clubbing.  Psychiatric: She has a normal mood and affect.      Data Reviewed: Basic Metabolic Panel: Recent Labs  Lab 07/02/19 1618 07/03/19 0635  NA 134* 133*  K 4.7 4.6  CL 97* 96*  CO2 21* 25  GLUCOSE 216* 177*  BUN 29* 37*  CREATININE 4.04* 5.03*  CALCIUM 8.9 9.1   Liver Function Tests: Recent Labs  Lab 07/02/19 1618 07/03/19 0635  AST 19 17  ALT 10 11  ALKPHOS 108 80  BILITOT 0.4 0.5  PROT 7.4 6.9  ALBUMIN 3.9 3.5   CBC: Recent Labs  Lab 07/02/19 1618 07/03/19 0635  WBC 14.0* 11.3*  NEUTROABS 10.7* 6.6  HGB 12.1 10.8*  HCT 39.6 35.7*  MCV 91.9 89.0  PLT 226 251   BNP (last 3 results) Recent Labs    08/03/18 2340 03/02/19 0530 07/02/19 1734  BNP 677.0* 867.0* 97.0     CBG: Recent Labs  Lab 07/03/19 1137 07/03/19 1657 07/03/19 2119 07/04/19 1327  07/04/19 1623  GLUCAP 234* 174* 223* 145* 223*    Recent Results (from the past 240 hour(s))  SARS Coronavirus 2 [facility] order, Performed in [facility] hospital lab) Nasopharyngeal Nasopharyngeal Swab     Status: None   Collection Time: 07/02/19  7:43 PM   Specimen: Nasopharyngeal Swab  Result Value Ref Range Status   SARS Coronavirus 2 NEGATIVE NEGATIVE Final    Comment: (NOTE) If result is NEGATIVE SARS-CoV-2 target nucleic acids are NOT DETECTED. The SARS-CoV-2 RNA is generally detectable in upper and lower  respiratory specimens during the acute phase of infection. The lowest  concentration of SARS-CoV-2 viral  copies this assay can detect is 250  copies / mL. A negative result does not preclude SARS-CoV-2 infection  and should not be used as the sole basis for treatment or other  patient management decisions.  A negative result may occur with  improper specimen collection / handling, submission of specimen other  than nasopharyngeal swab, presence of viral mutation(s) within the  areas targeted by this assay, and inadequate number of viral copies  (<250 copies / mL). A negative result must be combined with clinical  observations, patient history, and epidemiological information. If result is POSITIVE SARS-CoV-2 target nucleic acids are DETECTED. The SARS-CoV-2 RNA is generally detectable in upper and lower  respiratory specimens dur ing the acute phase of infection.  Positive  results are indicative of active infection with SARS-CoV-2.  Clinical  correlation with patient history and other diagnostic information is  necessary to determine patient infection status.  Positive results do  not rule out bacterial infection or co-infection with other viruses. If result is PRESUMPTIVE POSTIVE SARS-CoV-2 nucleic acids MAY BE PRESENT.   A presumptive positive result was obtained on the submitted specimen  and confirmed on repeat testing.  While 2019 novel coronavirus  (SARS-CoV-2) nucleic acids may be present in the submitted sample  additional confirmatory testing may be necessary for epidemiological  and / or clinical management purposes  to differentiate between  SARS-CoV-2 and other Sarbecovirus currently known to infect humans.  If clinically indicated additional testing with an alternate test  methodology [phone]) is advised. The SARS-CoV-2 RNA is generally  detectable in upper and lower respiratory sp ecimens during the acute  phase of infection. The expected result is Negative. Fact Sheet for Patients:  StrictlyIdeas.no Fact Sheet for Healthcare  Providers: BankingDealers.co.za This test is not yet approved or cleared by the [place] FDA and has been authorized for detection and/or diagnosis of SARS-CoV-2 by FDA under an Emergency Use Authorization (EUA).  This EUA will remain in effect (meaning this test can be used) for the duration of the COVID-19 declaration under Section 564(b)(1) of the Act, 21 U.S.C. section 360bbb-3(b)(1), unless the authorization is terminated or revoked sooner. Performed at [facility], [address] [place], [place] [address]   MRSA PCR Screening     Status: None   Collection Time: 07/03/19 12:31 AM   Specimen: Nasal Mucosa; Nasopharyngeal  Result Value Ref Range Status   MRSA by PCR NEGATIVE NEGATIVE Final    Comment:        The GeneXpert MRSA Assay (FDA approved for NASAL specimens only), is one component of a comprehensive MRSA colonization surveillance program. It is not intended to diagnose MRSA infection nor to guide or monitor treatment for MRSA infections. Performed at [facility], [address], [place], [place] [address]   Urine Culture     Status: None  Collection Time: 07/03/19  6:00 AM   Specimen: Urine, Random  Result Value Ref Range Status   Specimen Description   Final    URINE, RANDOM Performed at [facility], [address] [place], [place] [address]    Special Requests   Final    NONE Performed at [facility], [address] [place], [place] [address]    Culture   Final    Multiple bacterial morphotypes present, none predominant. Suggest appropriate recollection if clinically indicated.   Report Status 07/04/2019 FINAL  Final     Studies: Ct Head Wo Contrast  Result Date: 07/03/2019 CLINICAL DATA:  78 year old female with syncope. EXAM: CT HEAD WITHOUT CONTRAST TECHNIQUE: Contiguous axial images were obtained from the base of the skull through the vertex without intravenous contrast.  COMPARISON:  Brain MRI 10/22/2016, head CT 07/04/2018. FINDINGS: Brain: Stable cerebral volume since 2019. No midline shift, ventriculomegaly, mass effect, evidence of mass lesion, intracranial hemorrhage or evidence of cortically based acute infarction. Stable gray-white matter differentiation throughout the brain. Patchy periventricular hypodensity similar to the 2018 MRI FLAIR signal changes. Vascular: Extensive Calcified atherosclerosis at the skull base. No suspicious intracranial vascular hyperdensity. Skull: No acute osseous abnormality identified. Sinuses/Orbits: Visualized paranasal sinuses and mastoids are stable and well pneumatized. Other: No acute orbit or scalp soft tissue findings. There is some calcified scalp vessel atherosclerosis. IMPRESSION: No acute intracranial abnormality. Stable non contrast CT appearance of the brain since 2019. Electronically Signed   By: Genevie Ann M.D.   On: 07/03/2019 01:42    Scheduled Meds: . atorvastatin  40 mg Oral q1800  . calcium acetate  1,334 mg Oral TID AC  . Chlorhexidine Gluconate Cloth  6 each Topical Q0600  . citalopram  10 mg Oral Daily  . docusate sodium  100 mg Oral BID  . ferric citrate  420 mg Oral TID WC  . fluticasone  1 spray Each Nare Daily  . heparin  5,000 Units Subcutaneous Q8H  . hydrALAZINE  25 mg Oral TID WC  . insulin aspart  0-5 Units Subcutaneous QHS  . insulin aspart  0-9 Units Subcutaneous TID WC  . insulin glargine  25 Units Subcutaneous QHS  . iron polysaccharides  150 mg Oral Daily  . lamoTRIgine  25 mg Oral Daily  . levETIRAcetam  250 mg Oral BID  . loratadine  10 mg Oral Daily  . meclizine  12.5 mg Oral TID  . Melatonin  5 mg Oral QHS  . metoCLOPramide  5 mg Oral TID AC  . metoprolol tartrate  50 mg Oral BID  . mirabegron ER  25 mg Oral Daily  . multivitamin with minerals  1 tablet Oral Daily  . nystatin  1 g Topical See admin instructions  . OLANZapine  5 mg Oral QHS  . pantoprazole  40 mg Oral Daily  .  polyethylene glycol  17 g Oral Daily  . saccharomyces boulardii  250 mg Oral Daily  . senna-docusate  1 tablet Oral QHS  . sodium chloride flush  3 mL Intravenous Q12H  . vitamin B-12  1,000 mcg Oral Daily    Assessment/Plan:  1. Complication with dialysis PermCath in the right chest.  I discussed case with nephrology and they spoke with vascular surgery.  They will do a TPA infusion tomorrow morning and will keep her n.p.o. just in case she needs a catheter exchange.   2. Syncope after dialysis.  Patient does complain of vertigo symptoms.  Trial  of meclizine.  I ordered orthostatic vital signs and does not look like orthostatic from sitting to standing.  Physical therapy evaluation recommended rehab but the patient wants to go back to her assisted living. 3. Positive urine analysis in a dialysis patient.  Not quite sure what to make of this.  Urine culture showing a few organisms.  We will stop antibiotics and observe for now. 4. End-stage renal disease.  Patient stated that the dialysis did not run smoothly through the catheter. 5. Nausea.  Started a little Reglan.  Patient on Zofran already. 6. Type 2 diabetes mellitus on Lantus and sliding scale 7. Chronic diastolic congestive heart failure.  No signs of heart failure at this time.  Dialysis to manage fluid 8. Seizure disorder on Keppra 9. Hyperlipidemia on atorvastatin 10. Obesity.  Weight loss needed 11. Chronic respiratory failure on 3L 12. Weakness.  Physical therapy recommends rehab but she wants to go back to assisted living.  We will need to repeat COVID either way.  We will send today so results are back for tomorrow.  Code Status:     Code Status Orders  (From admission, onward)         Start     Ordered   07/02/19 2233  Full code  Continuous     07/02/19 2233        Code Status History    Date Active Date Inactive Code Status Order ID Comments User Context   03/02/2019 1210 03/20/2019 0026 Full Code [id number]   Vaughan Basta, MD Inpatient   08/04/2018 0556 08/07/2018 1951 DNR [id number]  Harrie Foreman, MD Inpatient   07/06/2018 1407 07/14/2018 1603 DNR [id number]  Vaughan Basta, MD Inpatient   07/04/2018 2200 07/06/2018 1407 Full Code [id number]  Demetrios Loll, MD Inpatient   04/22/2018 1702 04/26/2018 1851 DNR [id number]  Max Sane, MD Inpatient   02/21/2017 1629 02/24/2017 1747 DNR [id number]  Fritzi Mandes, MD Inpatient   01/31/2017 1617 02/04/2017 1350 DNR [id number]  Max Sane, MD Inpatient   01/23/2017 1320 01/31/2017 1617 Full Code [id number]  Henreitta Leber, MD Inpatient   10/05/2016 1216 10/26/2016 1740 DNR [id number]  Knox Royalty, NP Inpatient   09/24/2016 1731 10/05/2016 1216 Full Code [id number]  Gladstone Lighter, MD Inpatient   09/01/2016 0217 09/05/2016 1628 Full Code [id number]  Hugelmeyer, [place], DO Inpatient   08/09/2016 2027 08/12/2016 2015 Full Code [id number]  Henreitta Leber, MD Inpatient   09/30/2015 1423 10/07/2015 1737 Full Code [id number]  Loletha Grayer, MD ED   09/05/2015 1756 09/09/2015 1738 DNR [id number]  Loletha Grayer, MD ED   Advance Care Planning Activity    Advance Directive Documentation     Most Recent Value  Type of Advance Directive  Healthcare Power of Attorney, Living will  Pre-existing out of facility DNR order (yellow form or pink MOST form)  -  "MOST" Form in Place?  -     Family Communication: spoke with sister on the phone Disposition Plan: tbd  Consultants:  nephrology  Antibiotics:  levaquin  Time spent: 28 minutes  [place]

## 2019-07-04 NOTE — Progress Notes (Signed)
Pre HD Assessment:     07/04/19 0845  Neurological  Level of Consciousness Alert  Orientation Level Oriented X4  Respiratory  Respiratory Pattern Regular;Unlabored  Bilateral Breath Sounds Clear;Diminished  Cardiac  Pulse Regular  Cardiac Rhythm SB  Vascular  R Radial Pulse +2  L Radial Pulse +2  Musculoskeletal  Musculoskeletal (WDL) X (uses walker)  Generalized Weakness Yes  Psychosocial  Psychosocial (WDL) WDL

## 2019-07-04 NOTE — Progress Notes (Signed)
Post HD Assessment:     07/04/19 1245  Neurological  Level of Consciousness Alert  Orientation Level Oriented X4  Respiratory  Respiratory Pattern Regular;Unlabored  Bilateral Breath Sounds Clear;Diminished  Cardiac  Pulse Regular  Cardiac Rhythm SB  Vascular  R Radial Pulse +2  L Radial Pulse +2  Musculoskeletal  Musculoskeletal (WDL) X (uses walker)  Generalized Weakness Yes  Psychosocial  Psychosocial (WDL) WDL

## 2019-07-04 NOTE — Progress Notes (Signed)
HD Tx Completed:    07/04/19 1234  Vital Signs  Temp 98.5 F (36.9 C)  Temp Source Oral  Pulse Rate (!) 52  Pulse Rate Source Dinamap  Resp 18  BP (!) 138/49  BP Location Left Arm  BP Method Automatic  Patient Position (if appropriate) Lying  Oxygen Therapy  SpO2 100 %  O2 Device Nasal Cannula  O2 Flow Rate (L/min) 3 L/min  Pain Assessment  Pain Scale 0-10  Pain Score 0  Dialysis Weight  Weight 120 kg  Type of Weight Post-Dialysis  During Hemodialysis Assessment  Blood Flow Rate (mL/min) 300 mL/min  Arterial Pressure (mmHg) -150 mmHg  Venous Pressure (mmHg) 90 mmHg  Transmembrane Pressure (mmHg) 40 mmHg  Ultrafiltration Rate (mL/min) 440 mL/min  Dialysate Flow Rate (mL/min) 600 ml/min  Conductivity: Machine  14  HD Safety Checks Performed Yes  Intra-Hemodialysis Comments Tx completed

## 2019-07-04 NOTE — Progress Notes (Signed)
Pre HD Note:     07/04/19 0845  Vital Signs  Temp 98.8 F (37.1 C)  Temp Source Oral  Pulse Rate (!) 50  Pulse Rate Source Dinamap  Resp 14  BP (!) 156/42  BP Location Left Arm  BP Method Automatic  Patient Position (if appropriate) Lying  Oxygen Therapy  SpO2 98 %  O2 Device Nasal Cannula  O2 Flow Rate (L/min) 3 L/min  Pain Assessment  Pain Scale 0-10  Pain Score 0  Dialysis Weight  Weight 120 kg  Type of Weight Pre-Dialysis  Time-Out for Hemodialysis  What Procedure? HD   Pt Identifiers(min of two) First/Last Name;MRN/Account#;Pt's DOB(use if MRN/Acct# not available  Correct Site? Yes  Correct Side? Yes  Correct Procedure? Yes  Consents Verified? Yes  Rad Studies Available? N/A  Safety Precautions Reviewed? Yes  Engineer, civil (consulting) Number 5  Station Number 4  UF/Alarm Test Passed  Conductivity: Meter 14  Conductivity: Machine  14  pH 7.4  Reverse Osmosis main  Normal Saline Lot Number [id number]  Dialyzer Lot Number [id number]  Disposable Set Lot Number [id number]  Machine Temperature 98.6 F (37 C)  Musician and Audible Yes  Blood Lines Intact and Secured Yes  Pre Treatment Patient Checks  Vascular access used during treatment Catheter  HD catheter dressing before treatment WDL  Patient is receiving dialysis in a chair  (no)  Hepatitis B Surface Antigen Results Negative  Date Hepatitis B Surface Antigen Drawn 07/02/19  Isolation Initiated  (no)  Hepatitis B Surface Antibody  (<10)  Date Hepatitis B Surface Antibody Drawn 07/02/19  Hemodialysis Consent Verified Yes  Hemodialysis Standing Orders Initiated Yes  ECG (Telemetry) Monitor On Yes  Prime Ordered Normal Saline  Length of  DialysisTreatment -hour(s) 3.5 Hour(s)  Dialysis Treatment Comments  (NA 140)  Dialyzer Elisio 17H NR  Dialysate 3K;2.5 Ca  Dialysate Flow Ordered 600  Blood Flow Rate Ordered 400 mL/min  Ultrafiltration Goal 1 Liters  Dialysis Blood Pressure Support Ordered  Normal Saline  Education / Care Plan  Dialysis Education Provided Yes  Documented Education in Care Plan Yes  Outpatient Plan of Care Reviewed and on Chart Yes  Hemodialysis Catheter Right Subclavian Double lumen Permanent (Tunneled)  No Placement Date or Time found.   Placed prior to admission: Yes  Orientation: Right  Access Location: Subclavian  Hemodialysis Catheter Type: Double lumen Permanent (Tunneled)  Site Condition Other (Comment) (sluggish)  Blue Lumen Status Blood return noted  Red Lumen Status Blood return noted  Purple Lumen Status N/A  Dressing Type Biopatch;Occlusive  Dressing Status Clean;Dry;Intact  Drainage Description None  Dressing Change Due 07/09/19

## 2019-07-04 NOTE — Progress Notes (Signed)
Post HD Tx:     07/04/19 1245  Vital Signs  Temp 98.5 F (36.9 C)  Temp Source Oral  Pulse Rate (!) 57  Pulse Rate Source Dinamap  Resp 15  BP (!) 132/118  BP Location Left Arm  BP Method Automatic  Patient Position (if appropriate) Lying  Oxygen Therapy  SpO2 100 %  O2 Device Nasal Cannula  O2 Flow Rate (L/min) 3 L/min  Pain Assessment  Pain Scale 0-10  Pain Score 0  Dialysis Weight  Weight 120 kg  Type of Weight Post-Dialysis  Post-Hemodialysis Assessment  Rinseback Volume (mL) 250 mL  KECN 62.1 V  Dialyzer Clearance Lightly streaked  Duration of HD Treatment -hour(s) 3.5 hour(s)  Hemodialysis Intake (mL) 500 mL  UF Total -Machine (mL) 1500 mL  Net UF (mL) 1000 mL  Tolerated HD Treatment Yes  AVG/AVF Arterial Site Held (minutes)  (n/a )  AVG/AVF Venous Site Held (minutes)  (n/a)  Education / Care Plan  Dialysis Education Provided Yes  Documented Education in Care Plan Yes  Outpatient Plan of Care Reviewed and on Chart Yes  Hemodialysis Catheter Right Subclavian Double lumen Permanent (Tunneled)  No Placement Date or Time found.   Placed prior to admission: Yes  Orientation: Right  Access Location: Subclavian  Hemodialysis Catheter Type: Double lumen Permanent (Tunneled)  Site Condition No complications  Blue Lumen Status Heparin locked  Red Lumen Status Heparin locked  Purple Lumen Status N/A  Catheter fill solution Heparin 1000 units/ml  Catheter fill volume (Arterial) 1.6 cc  Catheter fill volume (Venous) 1.6  Dressing Type Biopatch;Occlusive  Dressing Status Clean;Dry;Intact  Drainage Description None  Dressing Change Due 07/09/19  Post treatment catheter status Capped and Clamped

## 2019-07-04 NOTE — Progress Notes (Signed)
Physical Therapy Treatment Patient Details Name: Carolyn Valdez MRN: [id number] DOB: 03/16/1941 Today's Date: 07/04/2019    History of Present Illness Carolyn Valdez is a 55yoF who comes to [facility] 9/14 from dialysis and had just completed her session when she suddenly passed out.  Per witness, patient went limp with cyanotic lips, prior complaints of nausea chronic neck and back painPMH: CHF, anxiety, breast cancer (RUE restricted), ESRD on HD MWF (since June 2020), CAD, diabetes mellitus, hypertension, hyperlipidemia, recurrent urinary tract infection, SVT, urinary incontinence, and GI bleed.    PT Comments    Pt agreeable to PT; reports chronic LB pain that is minimal at this time. Pt demonstrates in/out of bed mobility with Min A for trunk and increased time/effort throughout. Pt has had low BP's in supine asymptomatically and continues asymptomatic in supine and sit. Pt demonstrates unorthodox stand with B hands on rolling walker and heavy pull from slightly elevated bed position and Min A; pt becomes "lightheaded" within 20 seconds of stand with controlled sit to edge of bed, which resolves symptoms quickly. Stand performed 1 more time with several sidesteps at edge of bed and need to sit again within approximately 20 seconds due to lightheadedness. Pt returns to supine with very minimal assist and symptoms resolved. Due to risk of falling with stand, transfers and inability to ambulate at this time, pt would best be treated at a skilled nursing facility level to improve strength and endurance once BP stabilized. Continue PT.   Follow Up Recommendations  SNF     Equipment Recommendations  None recommended by PT    Recommendations for Other Services       Precautions / Restrictions Precautions Precautions: Fall Precaution Comments: recent syncope Restrictions Weight Bearing Restrictions: No    Mobility  Bed Mobility Overal bed mobility: Needs Assistance Bed Mobility: Supine to Sit;Sit to  Supine     Supine to sit: Min assist Sit to supine: Min assist   General bed mobility comments: mobilizes LE in/out of bed and can scoot hips in seated and supine. Min A for trunk  Transfers Overall transfer level: Needs assistance Equipment used: Rolling walker (2 wheeled) Transfers: Sit to/from Stand Sit to Stand: Min assist;From elevated surface         General transfer comment: heavy use of B hands on rw to stand.   Ambulation/Gait             General Gait Details: unable; dizziness in stand that susides with sit   Stairs             Wheelchair Mobility    Modified Rankin (Stroke Patients Only)       Balance Overall balance assessment: Needs assistance Sitting-balance support: Feet supported;Bilateral upper extremity supported Sitting balance-Leahy Scale: Fair     Standing balance support: Bilateral upper extremity supported Standing balance-Leahy Scale: Fair                              Cognition Arousal/Alertness: Awake/alert Behavior During Therapy: WFL for tasks assessed/performed Overall Cognitive Status: Within Functional Limits for tasks assessed                                        Exercises      General Comments        Pertinent Vitals/Pain Pain Assessment: (chronic back pain)  Home Living                      Prior Function            PT Goals (current goals can now be found in the care plan section) Progress towards PT goals: Progressing toward goals    Frequency    Min 2X/week      PT Plan Current plan remains appropriate    Co-evaluation              AM-PAC PT "6 Clicks" Mobility   Outcome Measure  Help needed turning from your back to your side while in a flat bed without using bedrails?: A Little Help needed moving from lying on your back to sitting on the side of a flat bed without using bedrails?: A Little Help needed moving to and from a bed to a chair  (including a wheelchair)?: A Lot Help needed standing up from a chair using your arms (e.g., wheelchair or bedside chair)?: A Little Help needed to walk in hospital room?: A Lot Help needed climbing 3-5 steps with a railing? : Total 6 Click Score: 14    End of Session Equipment Utilized During Treatment: Oxygen;Gait belt Activity Tolerance: Patient limited by fatigue;Other (comment)(Dizziness in stand) Patient left: in bed;with call bell/phone within reach;with bed alarm set   PT Visit Diagnosis: Muscle weakness (generalized) (M62.81);Difficulty in walking, not elsewhere classified (R26.2);Dizziness and giddiness (R42)     Time: 1439-1500 PT Time Calculation (min) (ACUTE ONLY): 21 min  Charges:  $Therapeutic Activity: 8-22 mins                      Larae Grooms, PTA 07/04/2019, 4:05 PM

## 2019-07-05 ENCOUNTER — Inpatient Hospital Stay: Payer: Medicare Other

## 2019-07-05 LAB — SARS CORONAVIRUS 2 (TAT 6-24 HRS): SARS Coronavirus 2: NEGATIVE

## 2019-07-05 LAB — GLUCOSE, CAPILLARY
Glucose-Capillary: 160 mg/dL — ABNORMAL HIGH (ref 70–99)
Glucose-Capillary: 174 mg/dL — ABNORMAL HIGH (ref 70–99)
Glucose-Capillary: 183 mg/dL — ABNORMAL HIGH (ref 70–99)
Glucose-Capillary: 210 mg/dL — ABNORMAL HIGH (ref 70–99)
Glucose-Capillary: 241 mg/dL — ABNORMAL HIGH (ref 70–99)

## 2019-07-05 LAB — CBC
HCT: 37 % (ref 36.0–46.0)
Hemoglobin: 11.2 g/dL — ABNORMAL LOW (ref 12.0–15.0)
MCH: 27.2 pg (ref 26.0–34.0)
MCHC: 30.3 g/dL (ref 30.0–36.0)
MCV: 89.8 fL (ref 80.0–100.0)
Platelets: 219 10*3/uL (ref 150–400)
RBC: 4.12 MIL/uL (ref 3.87–5.11)
RDW: 14.5 % (ref 11.5–15.5)
WBC: 7.2 10*3/uL (ref 4.0–10.5)
nRBC: 0 % (ref 0.0–0.2)

## 2019-07-05 MED ORDER — SODIUM CHLORIDE 0.9 % IV SOLN
5.0000 mg | Freq: Once | INTRAVENOUS | Status: AC
  Administered 2019-07-05: 5 mg via INTRAVENOUS
  Filled 2019-07-05: qty 5

## 2019-07-05 MED ORDER — HEPARIN SODIUM (PORCINE) 5000 UNIT/ML IJ SOLN
INTRAMUSCULAR | Status: AC
  Filled 2019-07-05: qty 2

## 2019-07-05 NOTE — Progress Notes (Signed)
[place] [facility] [place] NAME: Carolyn Valdez    MR#:  [id number]  DATE OF BIRTH:  [birth year]  SUBJECTIVE:   Patient seen in specials recovery getting TPA infusion to her right chest wall PermCath.  Still complaining of some generalized weakness.  No other acute events overnight.  She denies any nausea vomiting chest pains or shortness of breath.  REVIEW OF SYSTEMS:    Review of Systems  Constitutional: Negative for chills and fever.  HENT: Negative for congestion and tinnitus.   Eyes: Negative for blurred vision and double vision.  Respiratory: Negative for cough, shortness of breath and wheezing.   Cardiovascular: Negative for chest pain, orthopnea and PND.  Gastrointestinal: Negative for abdominal pain, diarrhea, nausea and vomiting.  Genitourinary: Negative for dysuria and hematuria.  Neurological: Positive for weakness (generalized). Negative for dizziness, sensory change and focal weakness.  All other systems reviewed and are negative.   Nutrition: Heart Healthy/Carb control Tolerating Diet: Yes Tolerating PT: Eval noted.    DRUG ALLERGIES:   Allergies  Allergen Reactions  . Sulfa Antibiotics Hives  . Biaxin [Clarithromycin] Hives  . Influenza A (H1n1) Monoval Vac Other (See Comments)    Pt states that she was told by her MD not to get the influenza vaccine.    . Morphine Other (See Comments)    Reaction:  Dizziness and confusion   . Pyridium [Phenazopyridine Hcl] Other (See Comments)    Reaction:  Unknown   . Ceftriaxone Anxiety  . Latex Rash  . Prednisone Rash  . Tape Rash    VITALS:  Blood pressure 115/61, pulse 61, temperature 98.2 F (36.8 C), temperature source Oral, resp. rate 14, height 5\' 8"  (1.727 m), weight 119.9 kg, SpO2 100 %.  PHYSICAL EXAMINATION:   Physical Exam  GENERAL:  78 y.o.-year-old obese patient lying in bed in no acute distress.  EYES: Pupils equal, round, reactive to light and accommodation.  No scleral icterus. Extraocular muscles intact.  HEENT: Head atraumatic, normocephalic. Oropharynx and nasopharynx clear.  NECK:  Supple, no jugular venous distention. No thyroid enlargement, no tenderness.  LUNGS: Normal breath sounds bilaterally, no wheezing, rales, rhonchi. No use of accessory muscles of respiration.  CARDIOVASCULAR: S1, S2 normal. No murmurs, rubs, or gallops.  ABDOMEN: Soft, nontender, nondistended. Bowel sounds present. No organomegaly or mass.  EXTREMITIES: No cyanosis, clubbing, +1-2 edema b/l NEUROLOGIC: Cranial nerves II through XII are intact. No focal Motor or sensory deficits b/l.   PSYCHIATRIC: The patient is alert and oriented x 3.  SKIN: No obvious rash, lesion, or ulcer.   Right chest wall Perm-cath in place.    LABORATORY PANEL:   CBC Recent Labs  Lab 07/05/19 0759  WBC 7.2  HGB 11.2*  HCT 37.0  PLT 219   ------------------------------------------------------------------------------------------------------------------  Chemistries  Recent Labs  Lab 07/03/19 0635  NA 133*  K 4.6  CL 96*  CO2 25  GLUCOSE 177*  BUN 37*  CREATININE 5.03*  CALCIUM 9.1  AST 17  ALT 11  ALKPHOS 80  BILITOT 0.5   ------------------------------------------------------------------------------------------------------------------  Cardiac Enzymes No results for input(s): TROPONINI in the last 168 hours. ------------------------------------------------------------------------------------------------------------------  RADIOLOGY:  No results found.   ASSESSMENT AND PLAN:   78 year old female with past medical history of end-stage renal disease on hemodialysis, obesity, history of SVT, recurrent urinary tract infections, diabetes, coronary artery disease, history of breast cancer, anxiety, chronic diastolic CHF who presented to the hospital due to syncopal episode  at dialysis.  1.  Syncope after dialysis- etiology unclear.  Patient has no vertiginous  symptoms.  Continue PRN meclizine. Orthostatic vital signs were negative.  Seen by physical therapy and the recommend short-term rehab but patient prefers to go back to her assisted living. -No other further episodes of syncope.  2.  Complication with her dialysis access-patient had some low flow through her right chest wall PermCath.  Status post TPA infusion today and flow rates have improved. -Plan for dialysis today and again tomorrow.  3.  End-stage renal disease on hemodialysis-patient had low flow through her permacath site.  -Status post TPA infusion and flow rates have improved. -Continue dialysis as per nephrology.  4.  History of urinary tract infection-patient's urinalysis is slightly positive here.  She is likely colonized, clinically she has no symptoms consistent with a UTI.  Hold off on antibiotics.  5.  History of seizures-continue Keppra.  6.  Hyperlipidemia-continue atorvastatin.  7.  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 -Follow blood sugars.  Plan for discharge tomorrow after hemodialysis if patient tolerates it well.     All the records are reviewed and case discussed with Care Management/Social Worker. Management plans discussed with the patient, family and they are in agreement.  CODE STATUS: Full code  DVT Prophylaxis: Hep. SQ  TOTAL TIME TAKING CARE OF THIS PATIENT: 30 minutes.   POSSIBLE D/C IN 1-2 DAYS, DEPENDING ON CLINICAL CONDITION.   Henreitta Leber M.D on 07/05/2019 at 2:53 PM  Between 7am to 6pm - Pager - [phone]  After 6pm go to [email or web address] - Proofreader  [facility]  Office  [phone]  CC: Primary care physician; System, Provider Not In

## 2019-07-05 NOTE — Plan of Care (Signed)
  Problem: Clinical Measurements: Goal: Will remain free from infection Outcome: Progressing   Problem: Activity: Goal: Risk for activity intolerance will decrease Outcome: Progressing   

## 2019-07-05 NOTE — Progress Notes (Signed)
[facility]    Completed TPA infusion into Permcath. Both ports flush easily. Heparin distilled in both ports.  Patient tolerated the procedure well.  Marcelle Overlie PA-C 07/05/2019 12:26 PM

## 2019-07-06 LAB — RENAL FUNCTION PANEL
Albumin: 3.4 g/dL — ABNORMAL LOW (ref 3.5–5.0)
Anion gap: 9 (ref 5–15)
BUN: 40 mg/dL — ABNORMAL HIGH (ref 8–23)
CO2: 29 mmol/L (ref 22–32)
Calcium: 9.4 mg/dL (ref 8.9–10.3)
Chloride: 93 mmol/L — ABNORMAL LOW (ref 98–111)
Creatinine, Ser: 4.75 mg/dL — ABNORMAL HIGH (ref 0.44–1.00)
GFR calc Af Amer: 10 mL/min — ABNORMAL LOW (ref >60)
GFR calc non Af Amer: 8 mL/min — ABNORMAL LOW (ref >60)
Glucose, Bld: 184 mg/dL — ABNORMAL HIGH (ref 70–99)
Phosphorus: 4.2 mg/dL (ref 2.5–4.6)
Potassium: 4.8 mmol/L (ref 3.5–5.1)
Sodium: 131 mmol/L — ABNORMAL LOW (ref 135–145)

## 2019-07-06 LAB — CBC
HCT: 34.6 % — ABNORMAL LOW (ref 36.0–46.0)
Hemoglobin: 10.5 g/dL — ABNORMAL LOW (ref 12.0–15.0)
MCH: 27.3 pg (ref 26.0–34.0)
MCHC: 30.3 g/dL (ref 30.0–36.0)
MCV: 89.9 fL (ref 80.0–100.0)
Platelets: 233 10*3/uL (ref 150–400)
RBC: 3.85 MIL/uL — ABNORMAL LOW (ref 3.87–5.11)
RDW: 14.6 % (ref 11.5–15.5)
WBC: 6.4 10*3/uL (ref 4.0–10.5)
nRBC: 0 % (ref 0.0–0.2)

## 2019-07-06 LAB — GLUCOSE, CAPILLARY
Glucose-Capillary: 174 mg/dL — ABNORMAL HIGH (ref 70–99)
Glucose-Capillary: 194 mg/dL — ABNORMAL HIGH (ref 70–99)
Glucose-Capillary: 178 mg/dL — ABNORMAL HIGH (ref 70–99)
Glucose-Capillary: 259 mg/dL — ABNORMAL HIGH (ref 70–99)

## 2019-07-06 MED ORDER — BISACODYL 10 MG RE SUPP
10.0000 mg | Freq: Once | RECTAL | Status: AC
  Administered 2019-07-06: 15:00:00 10 mg via RECTAL
  Filled 2019-07-06: qty 1

## 2019-07-06 NOTE — Progress Notes (Signed)
HD Pre-TX assessment  Pt arrived from her room to receive HD Tx. Pt vitals are WDL  Diastolic BP was low but not contraindication her baseline. Nephrologist is notified and okay to start tx.  Her SO2 is 100 on 3L [place]. Pt received HD in bed. Ready to start Tx.  07/06/19 0815  Hand-Off documentation  Report given to (Full Name) Newt Minion RN   Report received from (Full Name) Talbot Grumbling RN  Vital Signs  Temp 97.9 F (36.6 C)  Temp Source Oral  Pulse Rate (!) 49  Pulse Rate Source Monitor  Resp 14  BP (!) 151/47  BP Location Left Arm  BP Method Automatic  Patient Position (if appropriate) Lying  Oxygen Therapy  SpO2 100 %  O2 Device Nasal Cannula  O2 Flow Rate (L/min) 3 L/min  Pain Assessment  Pain Scale 0-10  Pain Score 0  Dialysis Weight  Weight 122.7 kg  Type of Weight Pre-Dialysis  Time-Out for Hemodialysis  What Procedure? HD  Pt Identifiers(min of two) First/Last Name;MRN/Account#  Correct Site? Yes  Correct Side? Yes  Correct Procedure? Yes  Consents Verified? Yes  Safety Precautions Reviewed? Yes  Engineer, civil (consulting) Number 5  Station Number 4  UF/Alarm Test Passed  Conductivity: Meter 14  Conductivity: Machine  14  pH 7.5  Reverse Osmosis Main  Normal Saline Lot Number [id number]  Dialyzer Lot Number [id number]  Disposable Set Lot Number [id number]  Machine Temperature 97.7 F (36.5 C)  Musician and Audible Yes  Blood Lines Intact and Secured Yes  Pre Treatment Patient Checks  Vascular access used during treatment Catheter  HD catheter dressing before treatment WDL  Patient is receiving dialysis in a chair  (In Bed)  Hepatitis B Surface Antigen Results Pending  Date Hepatitis B Surface Antigen Drawn 07/06/19  Hepatitis B Surface Antibody  ([place])  Date Hepatitis B Surface Antibody Drawn 07/06/19  Hemodialysis Consent Verified Yes  Hemodialysis Standing Orders Initiated Yes  ECG (Telemetry) Monitor On Yes  Prime Ordered Normal Saline  Length  of  DialysisTreatment -hour(s) 3.5 Hour(s)  Dialysis Treatment Comments  (Na140)  Dialyzer Elisio 17H NR  Dialysate 3K;2.5 Ca  Dialysis Anticoagulant Heparin  Dialysate Flow Ordered 600  Blood Flow Rate Ordered 400 mL/min  Ultrafiltration Goal 1000 Liters  During Hemodialysis Assessment  Blood Flow Rate (mL/min) 250 mL/min  Arterial Pressure (mmHg) -110 mmHg  Venous Pressure (mmHg) 120 mmHg  Transmembrane Pressure (mmHg) 50 mmHg  Ultrafiltration Rate (mL/min) 430 mL/min  Dialysate Flow Rate (mL/min) 600 ml/min  Conductivity: Machine  14  HD Safety Checks Performed Yes  Dialysis Fluid Bolus Normal Saline  Bolus Amount (mL) 250 mL  Education / Care Plan  Dialysis Education Provided Yes  Documented Education in Care Plan Yes

## 2019-07-06 NOTE — Progress Notes (Signed)
Pre HD Tx assessment   07/06/19 0815  Neurological  Level of Consciousness Alert  Orientation Level Oriented X4  Respiratory  Respiratory Pattern Regular;Unlabored  Chest Assessment Chest expansion symmetrical  Bilateral Breath Sounds Clear;Diminished  Cardiac  Pulse Regular  Heart Sounds S1, S2  Jugular Venous Distention (JVD) No  ECG Monitor Yes  Cardiac Rhythm NSR;SB  Antiarrhythmic device No  Vascular  R Radial Pulse +2  L Radial Pulse +2  Edema Generalized  RUE Edema +1  LUE Edema +1  RLE Edema +3  LLE Edema +3  Musculoskeletal  Musculoskeletal (WDL) X  Generalized Weakness Yes  Gastrointestinal  Bowel Sounds Assessment Active  GU Assessment  Genitourinary (WDL) X  Genitourinary Symptoms External catheter;Other (Comment);Oliguria  External Urinary Catheter  Placement Date/Time: 07/05/19 1600   External Urinary Catheter Type: Female  Collection Container Dedicated Suction Canister  Output (mL) 0 mL  Psychosocial  Psychosocial (WDL) WDL

## 2019-07-06 NOTE — Progress Notes (Signed)
[facility], [place] 07/06/19  Subjective:   LOS: 3 09/17 0701 - 09/18 0700 In: 360 [P.O.:360] Out: 200 [Urine:200]  Patient known to our practice from outpatient dialysis Presents for witnessed syncope postdialysis EEG is neg for Sz activitiy Seen during HD. Tolerating well. Good appetite   HEMODIALYSIS FLOWSHEET:  Blood Flow Rate (mL/min): 300 mL/min Arterial Pressure (mmHg): -140 mmHg Venous Pressure (mmHg): 110 mmHg Transmembrane Pressure (mmHg): 30 mmHg Ultrafiltration Rate (mL/min): 430 mL/min Dialysate Flow Rate (mL/min): 600 ml/min Conductivity: Machine : 14 Conductivity: Machine : 14 Dialysis Fluid Bolus: Normal Saline Bolus Amount (mL): 250 mL       Objective:  Vital signs in last 24 hours:  Temp:  [97.7 F (36.5 C)-98.8 F (37.1 C)] 97.9 F (36.6 C) (09/18 0815) Pulse Rate:  [49-66] 53 (09/18 1130) Resp:  [0-19] 0 (09/18 1015) BP: (90-160)/(38-61) 136/46 (09/18 1130) SpO2:  [97 %-100 %] 100 % (09/18 1130) Weight:  [122 kg-122.7 kg] 122.7 kg (09/18 0815)  Weight change: 1.972 kg Filed Weights   07/05/19 0541 07/06/19 0656 07/06/19 0815  Weight: 119.9 kg 122 kg 122.7 kg    Intake/Output:    Intake/Output Summary (Last 24 hours) at 07/06/2019 1238 Last data filed at 07/06/2019 0815 Gross per 24 hour  Intake 360 ml  Output 200 ml  Net 160 ml    Gen:   Alert, cooperative, no distress, laying in bed Head:   Normocephalic, without obvious abnormality, atraumatic Eyes/ENT:  conjunctiva/corneas clear,  moist oral mucus membranes Neck:  Supple,  thyroid: not enlarged, no JVD Lungs:   Clear to auscultation bilaterally, respirations unlabored Heart:   Regular rate and rhythm Abdomen:   Soft, non-tender,  Extremities: no cyanosis or edema Skin:  Skin color, texture, turgor normal, no rashes or lesions Neurologic: Alert and oriented, able to answer questions appropriately Rt IJ PC   Basic Metabolic Panel:  Recent Labs   Lab 07/02/19 1618 07/03/19 0635 07/06/19 1000  NA 134* 133* 131*  K 4.7 4.6 4.8  CL 97* 96* 93*  CO2 21* 25 29  GLUCOSE 216* 177* 184*  BUN 29* 37* 40*  CREATININE 4.04* 5.03* 4.75*  CALCIUM 8.9 9.1 9.4  PHOS  --   --  4.2     CBC: Recent Labs  Lab 07/02/19 1618 07/03/19 0635 07/05/19 0759 07/06/19 1000  WBC 14.0* 11.3* 7.2 6.4  NEUTROABS 10.7* 6.6  --   --   HGB 12.1 10.8* 11.2* 10.5*  HCT 39.6 35.7* 37.0 34.6*  MCV 91.9 89.0 89.8 89.9  PLT 226 251 219 233      Lab Results  Component Value Date   HEPBSAG Negative 03/09/2019   HEPBSAB Non Reactive 03/09/2019      Microbiology:  Recent Results (from the past 240 hour(s))  SARS Coronavirus 2 [facility] order, Performed in [facility] hospital lab) Nasopharyngeal Nasopharyngeal Swab     Status: None   Collection Time: 07/02/19  7:43 PM   Specimen: Nasopharyngeal Swab  Result Value Ref Range Status   SARS Coronavirus 2 NEGATIVE NEGATIVE Final    Comment: (NOTE) If result is NEGATIVE SARS-CoV-2 target nucleic acids are NOT DETECTED. The SARS-CoV-2 RNA is generally detectable in upper and lower  respiratory specimens during the acute phase of infection. The lowest  concentration of SARS-CoV-2 viral copies this assay can detect is 250  copies / mL. A negative result does not preclude SARS-CoV-2 infection  and should not be used as the sole basis for  treatment or other  patient management decisions.  A negative result may occur with  improper specimen collection / handling, submission of specimen other  than nasopharyngeal swab, presence of viral mutation(s) within the  areas targeted by this assay, and inadequate number of viral copies  (<250 copies / mL). A negative result must be combined with clinical  observations, patient history, and epidemiological information. If result is POSITIVE SARS-CoV-2 target nucleic acids are DETECTED. The SARS-CoV-2 RNA is generally detectable in upper and lower  respiratory  specimens dur ing the acute phase of infection.  Positive  results are indicative of active infection with SARS-CoV-2.  Clinical  correlation with patient history and other diagnostic information is  necessary to determine patient infection status.  Positive results do  not rule out bacterial infection or co-infection with other viruses. If result is PRESUMPTIVE POSTIVE SARS-CoV-2 nucleic acids MAY BE PRESENT.   A presumptive positive result was obtained on the submitted specimen  and confirmed on repeat testing.  While 2019 novel coronavirus  (SARS-CoV-2) nucleic acids may be present in the submitted sample  additional confirmatory testing may be necessary for epidemiological  and / or clinical management purposes  to differentiate between  SARS-CoV-2 and other Sarbecovirus currently known to infect humans.  If clinically indicated additional testing with an alternate test  methodology [phone]) is advised. The SARS-CoV-2 RNA is generally  detectable in upper and lower respiratory sp ecimens during the acute  phase of infection. The expected result is Negative. Fact Sheet for Patients:  StrictlyIdeas.no Fact Sheet for Healthcare Providers: BankingDealers.co.za This test is not yet approved or cleared by the [place] FDA and has been authorized for detection and/or diagnosis of SARS-CoV-2 by FDA under an Emergency Use Authorization (EUA).  This EUA will remain in effect (meaning this test can be used) for the duration of the COVID-19 declaration under Section 564(b)(1) of the Act, 21 U.S.C. section 360bbb-3(b)(1), unless the authorization is terminated or revoked sooner. Performed at [facility], [address] [place], [place] [address]   MRSA PCR Screening     Status: None   Collection Time: 07/03/19 12:31 AM   Specimen: Nasal Mucosa; Nasopharyngeal  Result Value Ref Range Status   MRSA by PCR NEGATIVE NEGATIVE  Final    Comment:        The GeneXpert MRSA Assay (FDA approved for NASAL specimens only), is one component of a comprehensive MRSA colonization surveillance program. It is not intended to diagnose MRSA infection nor to guide or monitor treatment for MRSA infections. Performed at [facility], [address], [place], [place] [address]   Urine Culture     Status: None   Collection Time: 07/03/19  6:00 AM   Specimen: Urine, Random  Result Value Ref Range Status   Specimen Description   Final    URINE, RANDOM Performed at [facility], [address] [place], [place] [address]    Special Requests   Final    NONE Performed at [facility], [address] [place], [place] [address]    Culture   Final    Multiple bacterial morphotypes present, none predominant. Suggest appropriate recollection if clinically indicated.   Report Status 07/04/2019 FINAL  Final  SARS CORONAVIRUS 2 (TAT 6-24 HRS) Nasopharyngeal Nasopharyngeal Swab     Status: None   Collection Time: 07/04/19  5:10 PM   Specimen: Nasopharyngeal Swab  Result Value Ref Range Status   SARS Coronavirus 2 NEGATIVE NEGATIVE Final  Comment: (NOTE) SARS-CoV-2 target nucleic acids are NOT DETECTED. The SARS-CoV-2 RNA is generally detectable in upper and lower respiratory specimens during the acute phase of infection. Negative results do not preclude SARS-CoV-2 infection, do not rule out co-infections with other pathogens, and should not be used as the sole basis for treatment or other patient management decisions. Negative results must be combined with clinical observations, patient history, and epidemiological information. The expected result is Negative. Fact Sheet for Patients: SugarRoll.be Fact Sheet for Healthcare Providers: https://www.woods-mathews.com/ This test is not yet approved or cleared by the [place] FDA and  has been authorized  for detection and/or diagnosis of SARS-CoV-2 by FDA under an Emergency Use Authorization (EUA). This EUA will remain  in effect (meaning this test can be used) for the duration of the COVID-19 declaration under Section 56 4(b)(1) of the Act, 21 U.S.C. section 360bbb-3(b)(1), unless the authorization is terminated or revoked sooner. Performed at [facility], [place] [address], [place], [place] [address]     Coagulation Studies: No results for input(s): LABPROT, INR in the last 72 hours.  Urinalysis: No results for input(s): COLORURINE, LABSPEC, PHURINE, GLUCOSEU, HGBUR, BILIRUBINUR, KETONESUR, PROTEINUR, UROBILINOGEN, NITRITE, LEUKOCYTESUR in the last 72 hours.  Invalid input(s): APPERANCEUR    Imaging: No results found.   Medications:    . atorvastatin  40 mg Oral q1800  . calcium acetate  1,334 mg Oral TID AC  . Chlorhexidine Gluconate Cloth  6 each Topical Q0600  . citalopram  10 mg Oral Daily  . docusate sodium  100 mg Oral BID  . ferric citrate  420 mg Oral TID WC  . fluticasone  1 spray Each Nare Daily  . heparin  5,000 Units Subcutaneous Q8H  . hydrALAZINE  25 mg Oral TID WC  . insulin aspart  0-5 Units Subcutaneous QHS  . insulin aspart  0-9 Units Subcutaneous TID WC  . insulin glargine  25 Units Subcutaneous QHS  . iron polysaccharides  150 mg Oral Daily  . lamoTRIgine  25 mg Oral Daily  . levETIRAcetam  250 mg Oral BID  . loratadine  10 mg Oral Daily  . meclizine  12.5 mg Oral TID  . Melatonin  5 mg Oral QHS  . metoCLOPramide  5 mg Oral TID AC  . metoprolol tartrate  50 mg Oral BID  . mirabegron ER  25 mg Oral Daily  . multivitamin with minerals  1 tablet Oral Daily  . nystatin  1 g Topical See admin instructions  . OLANZapine  5 mg Oral QHS  . pantoprazole  40 mg Oral Daily  . polyethylene glycol  17 g Oral Daily  . saccharomyces boulardii  250 mg Oral Daily  . senna-docusate  1 tablet Oral QHS  . sodium chloride flush  3 mL Intravenous Q12H  .  vitamin B-12  1,000 mcg Oral Daily   acetaminophen, albuterol, camphor-menthol, clonazePAM, ondansetron, oxyCODONE-acetaminophen, tiZANidine  Assessment/ Plan:  78 y.o. female with congestive heart failure, hypertension, diabetes type 2, obstructive sleep apnea, nephrolithiasis.  Started chronic dialysis June 2020  Active Problems:   Syncope   Vertigo  [facility] [address]/120 kg/ MWF/240 min  #.  End-stage renal disease Recent Labs    07/02/19 1618 07/03/19 0635 07/06/19 1000  CREATININE 4.04* 5.03* 4.75*  patient seen during HD treatment tomorrow Standing post-dialysis weight today to make recommendations for outpatient Heparin bolus 2000 units; drip 500 u/hr as needed  #. Anemia of CKD  Lab Results  Component Value Date   HGB 10.5 (L) 07/06/2019  Continue EPO with hemodialysis  #. [facility]     Component Value Date/Time   PTH 51 03/04/2019 0507   Lab Results  Component Value Date   PHOS 4.2 07/06/2019  Monitor phosphorus  # Syncope - neurology evaluation ongoing ? May be related to excessive ultrafiltration Outpatient dialysis target weight will need to be increased to 122 kg Catheter declotted with tPA infusion  #. Diabetes type 2 with CKD Hemoglobin A1C (%)  Date Value  09/04/2014 6.2   Hgb A1c MFr Bld (%)  Date Value  07/03/2019 7.7 (H)     LOS: [place] 9/18/202012:38 [place], [place]

## 2019-07-06 NOTE — Discharge Summary (Signed)
[facility] NAME: Carolyn Valdez    MR#:  [id number]  DATE OF BIRTH:  09/01/1941  DATE OF ADMISSION:  07/02/2019 ADMITTING PHYSICIAN: Lang Snow, NP  DATE OF DISCHARGE: 07/06/2019  PRIMARY CARE PHYSICIAN: System, Provider Not In    ADMISSION DIAGNOSIS:  Syncope and collapse [R55] ESRD (end stage renal disease) on dialysis ([place]) [N18.6, Z99.2]  DISCHARGE DIAGNOSIS:  Active Problems:   Syncope   Vertigo   SECONDARY DIAGNOSIS:   Past Medical History:  Diagnosis Date  . (HFpEF) heart failure with preserved ejection fraction ([place])    a. 06/2018 Echo: EF 60-65%, no rwma. Gr2 DD. Mild LVH. Ao sclerosis w/o stenosis. Mild MR. Mild to mod LAE. Nl RV fxn. Mild RAE. Mod TR. PASP 60-27mmHg.  Marland Kitchen Anxiety   . Breast cancer ([place]) 1997   a. 1997 s/p right breast mastectomy  . Chest pain    a. 11/2008 MV: No ischemia. EF 66%.  . CKD (chronic kidney disease) stage 4, GFR 15-29 ml/min (HCC)   . Coronary artery calcification seen on CT scan    a. 01/2017 Chest CT: Atherosclerotic coronary Ca2+.  . Diabetes mellitus without complication ([place])   . DJD (degenerative joint disease)   . Dysuria   . GIB (gastrointestinal bleeding)    a. 07/2018 EGD: Gastritis w/ hemorrhage, duodenal angioectasia. Friable gastric mucosa w/ bleeding on contact. All treated w/ argon plasma coagulation; b. 07/2018 s/p 2u PRBCs.  . H/O total knee replacement    right  . HTN (hypertension)   . Hypercholesteremia   . Kidney stone   . Obesity   . Recurrent urinary tract infection   . SVT (supraventricular tachycardia) ([place])   . Urinary incontinence   . Vaginal atrophy   . Yeast vaginitis     HOSPITAL COURSE:   78 year old female with past medical history of end-stage renal disease on hemodialysis, obesity, history of SVT, recurrent urinary tract infections, diabetes, coronary artery disease, history of breast cancer, anxiety, chronic diastolic CHF who  presented to the hospital due to syncopal episode at dialysis.  1.  Syncope after dialysis-  likely vasovagal in nature, patient was not orthostatic, she had no vertiginous symptoms.  She was given some meclizine as needed and has had no further episodes.   Seen by physical therapy and the recommend short-term rehab but patient prefers to go back to her assisted living.  2.  Complication with her dialysis access-patient had some low flow through her right chest wall PermCath.  Status post TPA infusion today and flow rates have improved. -Patient has tolerated her hemodialysis yesterday and today through the right chest wall PermCath well and therefore being discharged back to her assisted living.  3.  End-stage renal disease on hemodialysis-patient had low flow through her permacath site.  -Status post TPA infusion and flow rates have improved. -Patient will continue her dialysis through her right chest wall PermCath on a Monday Wednesday Friday schedule.  Patient is clinically stable.  4.  History of urinary tract infection-patient's urinalysis was slightly positive here.  She is colonized, as clinically she has no symptoms consistent with a UTI.   - pt. Was not treated with any abx while in the hospital.   5.  History of seizures- pt. Will continue Keppra. - no acute seizures while in the hospital.   6.  Hyperlipidemia- pt. Will continue atorvastatin.  7.  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  She will resume her Lantus and NovoLog with meals.  Blood sugar stable.  8.  Essential hypertension-patient will continue her metoprolol, losartan, hydralazine.  Patient is stable to be discharged back to her assisted living.  DISCHARGE CONDITIONS:   Stable  CONSULTS OBTAINED:  Treatment Team:  Murlean Iba, MD  DRUG ALLERGIES:   Allergies  Allergen Reactions  . Sulfa Antibiotics  Hives  . Biaxin [Clarithromycin] Hives  . Influenza A (H1n1) Monoval Vac Other (See Comments)    Pt states that she was told by her MD not to get the influenza vaccine.    . Morphine Other (See Comments)    Reaction:  Dizziness and confusion   . Pyridium [Phenazopyridine Hcl] Other (See Comments)    Reaction:  Unknown   . Ceftriaxone Anxiety  . Latex Rash  . Prednisone Rash  . Tape Rash    DISCHARGE MEDICATIONS:   Allergies as of 07/06/2019      Reactions   Sulfa Antibiotics Hives   Biaxin [clarithromycin] Hives   Influenza A (h1n1) Monoval Vac Other (See Comments)   Pt states that she was told by her MD not to get the influenza vaccine.     Morphine Other (See Comments)   Reaction:  Dizziness and confusion    Pyridium [phenazopyridine Hcl] Other (See Comments)   Reaction:  Unknown    Ceftriaxone Anxiety   Latex Rash   Prednisone Rash   Tape Rash      Medication List    STOP taking these medications   aspirin 81 MG chewable tablet     TAKE these medications   acetaminophen 325 MG tablet Commonly known as: TYLENOL Take 2 tablets (650 mg total) by mouth every 6 (six) hours as needed for mild pain (or Fever >/= 101).   albuterol 108 (90 Base) MCG/ACT inhaler Commonly known as: VENTOLIN HFA Inhale 2 puffs into the lungs every 6 (six) hours as needed for wheezing or shortness of breath.   atorvastatin 40 MG tablet Commonly known as: LIPITOR Take 1 tablet (40 mg total) by mouth daily at 6 PM. What changed: when to take this   Auryxia 1 GM 210 MG(Fe) tablet Generic drug: ferric citrate Take 420 mg by mouth 3 (three) times daily with meals.   calcium acetate 667 MG capsule Commonly known as: PHOSLO Take 1,334 mg by mouth 3 (three) times daily before meals.   citalopram 10 MG tablet Commonly known as: CELEXA Take 1 tablet (10 mg total) by mouth daily.   clonazePAM 0.25 MG disintegrating tablet Commonly known as: KLONOPIN Take 1 tablet (0.25 mg total) by mouth  every 12 (twelve) hours as needed (anxiety).   docusate sodium 100 MG capsule Commonly known as: COLACE Take 100 mg by mouth 2 (two) times daily.   fluticasone 50 MCG/ACT nasal spray Commonly known as: FLONASE Place 1 spray into both nostrils daily.   glipiZIDE 5 MG tablet Commonly known as: GLUCOTROL Take 5 mg by mouth daily before breakfast.   hydrALAZINE 25 MG tablet Commonly known as: APRESOLINE Take 1 tablet (25 mg total) by mouth 3 (three) times daily with meals.   insulin aspart 100 UNIT/ML injection Commonly known as: novoLOG 3 (three) times daily before meals. Inject per sliding scale 0-199=0, 200-500=5 units, notify MD for blood sugar> or equal to 500   insulin glargine 100 UNIT/ML injection Commonly known as: LANTUS Inject 0.4 mLs (40 Units total)  into the skin daily. What changed:   how much to take  when to take this   iron polysaccharides 150 MG capsule Commonly known as: NIFEREX Take 150 mg by mouth daily.   lamoTRIgine 25 MG tablet Commonly known as: LAMICTAL Take 1 tablet (25 mg total) by mouth daily.   levETIRAcetam 250 MG tablet Commonly known as: KEPPRA Take 1 tablet (250 mg total) by mouth 2 (two) times daily.   loratadine 10 MG tablet Commonly known as: CLARITIN Take 10 mg by mouth daily.   losartan 50 MG tablet Commonly known as: COZAAR Take 50 mg by mouth daily.   Melatonin 3 MG Tabs Take 6 mg by mouth at bedtime.   Men-Phor lotion Generic drug: camphor-menthol Apply 1 application topically every 6 (six) hours as needed for itching.   metoprolol tartrate 50 MG tablet Commonly known as: LOPRESSOR Take 50 mg by mouth 2 (two) times daily.   multivitamin tablet Take 1 tablet by mouth daily.   Myrbetriq 25 MG Tb24 tablet Generic drug: mirabegron ER Take 25 mg by mouth daily.   nystatin powder Generic drug: nystatin Apply 1 g topically See admin instructions. Apply to under breast and abdominal once daily & apply every 12 hours  as needed for abdominal folds, groin, and under breast   OLANZapine 5 MG tablet Commonly known as: ZYPREXA Take 5 mg by mouth at bedtime.   ondansetron 8 MG disintegrating tablet Commonly known as: ZOFRAN-ODT Take 8 mg by mouth every 8 (eight) hours as needed for nausea/vomiting.   oxyCODONE-acetaminophen 5-325 MG tablet Commonly known as: PERCOCET/ROXICET Take 1 tablet by mouth every 6 (six) hours as needed for moderate pain or severe pain.   pantoprazole 40 MG tablet Commonly known as: PROTONIX Take 40 mg by mouth daily.   polyethylene glycol 17 g packet Commonly known as: MIRALAX / GLYCOLAX Take 17 g by mouth daily.   saccharomyces boulardii 250 MG capsule Commonly known as: FLORASTOR Take 250 mg by mouth daily.   sennosides-docusate sodium 8.6-50 MG tablet Commonly known as: SENOKOT-S Take 1 tablet by mouth at bedtime.   tiZANidine 2 MG tablet Commonly known as: ZANAFLEX Take 2 mg by mouth every 8 (eight) hours as needed for muscle spasms.   torsemide 20 MG tablet Commonly known as: DEMADEX Take 2 tablets (40 mg total) by mouth daily. What changed: how much to take   Uribel 118 MG Caps Take 118 mg by mouth every 6 (six) hours as needed (uti symptoms).   vitamin B-12 1000 MCG tablet Commonly known as: CYANOCOBALAMIN Take 1,000 mcg by mouth daily.         DISCHARGE INSTRUCTIONS:   DIET:  Cardiac diet and Diabetic diet  DISCHARGE CONDITION:  Stable  ACTIVITY:  Activity as tolerated  OXYGEN:  Home Oxygen: No.   Oxygen Delivery: room air  DISCHARGE LOCATION:  Assisted Living   If you experience worsening of your admission symptoms, develop shortness of breath, life threatening emergency, suicidal or homicidal thoughts you must seek medical attention immediately by calling 911 or calling your MD immediately  if symptoms less severe.  You Must read complete instructions/literature along with all the possible adverse reactions/side effects for all  the Medicines you take and that have been prescribed to you. Take any new Medicines after you have completely understood and accpet all the possible adverse reactions/side effects.   Please note  You were cared for by a hospitalist during your hospital stay. If you have any questions  about your discharge medications or the care you received while you were in the hospital after you are discharged, you can call the unit and asked to speak with the hospitalist on call if the hospitalist that took care of you is not available. Once you are discharged, your primary care physician will handle any further medical issues. Please note that NO REFILLS for any discharge medications will be authorized once you are discharged, as it is imperative that you return to your primary care physician (or establish a relationship with a primary care physician if you do not have one) for your aftercare needs so that they can reassess your need for medications and monitor your lab values.     Today   Pt. Seen at HD and tolerating it well.  No other acute events overnight.  No complaints presently. Will d/c back to assisted Living today.   VITAL SIGNS:  Blood pressure (!) 146/46, pulse (!) 58, temperature 98.3 F (36.8 C), temperature source Oral, resp. rate 18, height 5\' 8"  (1.727 m), weight 122.7 kg, SpO2 100 %.  I/O:    Intake/Output Summary (Last 24 hours) at 07/06/2019 1343 Last data filed at 07/06/2019 1230 Gross per 24 hour  Intake 360 ml  Output 1200 ml  Net -840 ml    PHYSICAL EXAMINATION:   GENERAL:  78 y.o.-year-old obese patient lying in bed in no acute distress.  EYES: Pupils equal, round, reactive to light and accommodation. No scleral icterus. Extraocular muscles intact.  HEENT: Head atraumatic, normocephalic. Oropharynx and nasopharynx clear.  NECK:  Supple, no jugular venous distention. No thyroid enlargement, no tenderness.  LUNGS: Normal breath sounds bilaterally, no wheezing, rales,  rhonchi. No use of accessory muscles of respiration.  CARDIOVASCULAR: S1, S2 normal. No murmurs, rubs, or gallops.  ABDOMEN: Soft, nontender, nondistended. Bowel sounds present. No organomegaly or mass.  EXTREMITIES: No cyanosis, clubbing, +1-2 edema b/l NEUROLOGIC: Cranial nerves II through XII are intact. No focal Motor or sensory deficits b/l. Globally weak and bedbound.  PSYCHIATRIC: The patient is alert and oriented x 3.  SKIN: No obvious rash, lesion, or ulcer.   Right chest wall Perm-cath in place.  DATA REVIEW:   CBC Recent Labs  Lab 07/06/19 1000  WBC 6.4  HGB 10.5*  HCT 34.6*  PLT 233    Chemistries  Recent Labs  Lab 07/03/19 0635 07/06/19 1000  NA 133* 131*  K 4.6 4.8  CL 96* 93*  CO2 25 29  GLUCOSE 177* 184*  BUN 37* 40*  CREATININE 5.03* 4.75*  CALCIUM 9.1 9.4  AST 17  --   ALT 11  --   ALKPHOS 80  --   BILITOT 0.5  --     Cardiac Enzymes No results for input(s): TROPONINI in the last 168 hours.  Microbiology Results  Results for orders placed or performed during the hospital encounter of 07/02/19  SARS Coronavirus 2 [facility] order, Performed in [facility] hospital lab) Nasopharyngeal Nasopharyngeal Swab     Status: None   Collection Time: 07/02/19  7:43 PM   Specimen: Nasopharyngeal Swab  Result Value Ref Range Status   SARS Coronavirus 2 NEGATIVE NEGATIVE Final    Comment: (NOTE) If result is NEGATIVE SARS-CoV-2 target nucleic acids are NOT DETECTED. The SARS-CoV-2 RNA is generally detectable in upper and lower  respiratory specimens during the acute phase of infection. The lowest  concentration of SARS-CoV-2 viral copies this assay can detect is 250  copies / mL. A negative result does  not preclude SARS-CoV-2 infection  and should not be used as the sole basis for treatment or other  patient management decisions.  A negative result may occur with  improper specimen collection / handling, submission of specimen other  than nasopharyngeal  swab, presence of viral mutation(s) within the  areas targeted by this assay, and inadequate number of viral copies  (<250 copies / mL). A negative result must be combined with clinical  observations, patient history, and epidemiological information. If result is POSITIVE SARS-CoV-2 target nucleic acids are DETECTED. The SARS-CoV-2 RNA is generally detectable in upper and lower  respiratory specimens dur ing the acute phase of infection.  Positive  results are indicative of active infection with SARS-CoV-2.  Clinical  correlation with patient history and other diagnostic information is  necessary to determine patient infection status.  Positive results do  not rule out bacterial infection or co-infection with other viruses. If result is PRESUMPTIVE POSTIVE SARS-CoV-2 nucleic acids MAY BE PRESENT.   A presumptive positive result was obtained on the submitted specimen  and confirmed on repeat testing.  While 2019 novel coronavirus  (SARS-CoV-2) nucleic acids may be present in the submitted sample  additional confirmatory testing may be necessary for epidemiological  and / or clinical management purposes  to differentiate between  SARS-CoV-2 and other Sarbecovirus currently known to infect humans.  If clinically indicated additional testing with an alternate test  methodology [id number]) is advised. The SARS-CoV-2 RNA is generally  detectable in upper and lower respiratory sp ecimens during the acute  phase of infection. The expected result is Negative. Fact Sheet for Patients:  StrictlyIdeas.no Fact Sheet for Healthcare Providers: BankingDealers.co.za This test is not yet approved or cleared by the [place] FDA and has been authorized for detection and/or diagnosis of SARS-CoV-2 by FDA under an Emergency Use Authorization (EUA).  This EUA will remain in effect (meaning this test can be used) for the duration of the COVID-19 declaration  under Section 564(b)(1) of the Act, 21 U.S.C. section 360bbb-3(b)(1), unless the authorization is terminated or revoked sooner. Performed at [facility], [address] [place], [place] [address]   MRSA PCR Screening     Status: None   Collection Time: 07/03/19 12:31 AM   Specimen: Nasal Mucosa; Nasopharyngeal  Result Value Ref Range Status   MRSA by PCR NEGATIVE NEGATIVE Final    Comment:        The GeneXpert MRSA Assay (FDA approved for NASAL specimens only), is one component of a comprehensive MRSA colonization surveillance program. It is not intended to diagnose MRSA infection nor to guide or monitor treatment for MRSA infections. Performed at [facility], [address] [place], [place] [address]   Urine Culture     Status: None   Collection Time: 07/03/19  6:00 AM   Specimen: Urine, Random  Result Value Ref Range Status   Specimen Description   Final    URINE, RANDOM Performed at [facility], [address], [place], [place] [address]    Special Requests   Final    NONE Performed at [facility], [address] [place], [place] [address]    Culture   Final    Multiple bacterial morphotypes present, none predominant. Suggest appropriate recollection if clinically indicated.   Report Status 07/04/2019 FINAL  Final  SARS CORONAVIRUS 2 (TAT 6-24 HRS) Nasopharyngeal Nasopharyngeal Swab     Status: None   Collection Time: 07/04/19  5:10 PM   Specimen: Nasopharyngeal  Swab  Result Value Ref Range Status   SARS Coronavirus 2 NEGATIVE NEGATIVE Final    Comment: (NOTE) SARS-CoV-2 target nucleic acids are NOT DETECTED. The SARS-CoV-2 RNA is generally detectable in upper and lower respiratory specimens during the acute phase of infection. Negative results do not preclude SARS-CoV-2 infection, do not rule out co-infections with other pathogens, and should not be used as the sole basis for treatment or other patient management  decisions. Negative results must be combined with clinical observations, patient history, and epidemiological information. The expected result is Negative. Fact Sheet for Patients: SugarRoll.be Fact Sheet for Healthcare Providers: https://www.woods-mathews.com/ This test is not yet approved or cleared by the [place] FDA and  has been authorized for detection and/or diagnosis of SARS-CoV-2 by FDA under an Emergency Use Authorization (EUA). This EUA will remain  in effect (meaning this test can be used) for the duration of the COVID-19 declaration under Section 56 4(b)(1) of the Act, 21 U.S.C. section 360bbb-3(b)(1), unless the authorization is terminated or revoked sooner. Performed at [facility], [place] [address] [place], [place] [address]     RADIOLOGY:  No results found.    Management plans discussed with the patient, family and they are in agreement.  CODE STATUS:     Code Status Orders  (From admission, onward)         Start     Ordered   07/02/19 2233  Full code  Continuous     07/02/19 2233          TOTAL TIME TAKING CARE OF THIS PATIENT: 40 minutes.    Henreitta Leber M.D on 07/06/2019 at 1:43 PM  Between 7am to 6pm - Pager - [phone]  After 6pm go to [email or web address] - Proofreader  [facility]  Office  [phone]  CC: Primary care physician; System, Provider Not In

## 2019-07-06 NOTE — NC FL2 (Signed)
[place] LEVEL OF CARE SCREENING TOOL     IDENTIFICATION  Patient Name: Carolyn Valdez: 04/09/1941 Sex: female Admission Date (Current Location): 07/02/2019  [facility] and [place] Number:  Engineering geologist and Address:  [facility], [address], [place], [place] [address]      Provider Number: [id number]  Attending Physician Name and Address:  Henreitta Leber, MD  Relative Name and Phone Number:  [name] [phone]    Current Level of Care: Hospital Recommended Level of Care: [place] Prior Approval Number:    Date Approved/Denied:   PASRR Number:    Discharge Plan: Assisted Living Facility    Current Diagnoses: Patient Active Problem List   Diagnosis Date Noted  . Vertigo 07/03/2019  . Acute on chronic diastolic CHF (congestive heart failure) ([place]) 03/02/2019  . Acute renal failure superimposed on chronic kidney disease ([place]) 03/02/2019  . Anemia associated with chronic renal failure 10/25/2018  . Anemia 08/06/2018  . Acute hemorrhagic gastritis   . Stomach irritation   . Angiodysplasia of stomach and duodenum   . CKD (chronic kidney disease), stage III ([place]) 08/04/2018  . Hyperkalemia 07/04/2018  . Atrial fibrillation with RVR ([place]) 04/23/2018  . Syncope 04/22/2018  . Acute respiratory failure with hypoxia and hypercapnia ([place]) 02/21/2017  . Adjustment disorder with anxiety 01/28/2017  . Acute on chronic respiratory failure with hypoxia ([place]) 01/23/2017  . Palliative care encounter   . Goals of care, counseling/discussion   . DNR (do not resuscitate) 10/05/2016  . Palliative care by specialist 10/05/2016  . Depression, major, recurrent, severe with psychosis ([place]) 10/05/2016  . Severe major depression, single episode, with psychotic features ([place]) 10/04/2016  . Cellulitis of leg, right 09/29/2016  . Proctitis 09/29/2016  . Hypercapnia 09/29/2016  . Acute urinary retention 09/29/2016   . UTI (urinary tract infection) 09/29/2016  . Weakness 09/29/2016  . Subacute delirium 09/28/2016  . Gastrointestinal hemorrhage   . Diffuse abdominal pain   . Altered mental status 09/24/2016  . Pressure injury of skin 09/02/2016  . Respiratory failure with hypoxia ([place]) 09/01/2016  . Lymphedema 08/16/2016  . Acute on chronic heart failure with preserved ejection fraction (HFpEF) ([place]) 08/09/2016  . Acquired lymphedema of leg 04/21/2016  . Congestive heart failure ([place]) 11/25/2015  . Nocturia 11/05/2015  . Urinary frequency 11/05/2015  . Acute respiratory failure with hypoxia ([place]) 09/05/2015  . 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 Incontinence 04/20/2015  . Diabetes mellitus, type 2 ([place]) 04/17/2015  . ESBL (extended spectrum beta-lactamase) producing bacteria infection 04/17/2015  . Hypertension 04/17/2015  . Frequent UTI 04/17/2015  . Absence of bladder continence 04/17/2015  . Iron deficiency anemia 03/08/2015  . Symptomatic anemia 09/25/2014  . Abdominal pain, lower 09/25/2014  . Urge incontinence 02/19/2013  . FOM (frequency of micturition) 02/19/2013  . Bladder infection, chronic 08/11/2012  . Difficult or painful urination 07/26/2012  . Lower urinary tract infection 12/31/2011  . Diabetes mellitus ([place]) 12/31/2011    Orientation RESPIRATION BLADDER Height & Weight     Self, Time, Situation, Place  O2 Continent Weight: 270 lb 8.1 oz (122.7 kg) Height:  5\' 8"  (172.7 cm)  BEHAVIORAL SYMPTOMS/MOOD NEUROLOGICAL BOWEL NUTRITION STATUS      Continent Diet(Renal/carb modified 1259ml restriction)  AMBULATORY STATUS COMMUNICATION OF NEEDS Skin   Limited Assist Verbally Normal                       Personal Care Assistance  Level of Assistance  Bathing, Feeding, Dressing Bathing Assistance: Limited assistance Feeding assistance: Independent Dressing Assistance: Limited assistance     Functional Limitations Info  Sight Sight Info: Adequate Hearing Info: Adequate Speech  Info: Adequate    SPECIAL CARE FACTORS FREQUENCY  PT (By licensed PT), OT (By licensed OT)     PT Frequency: 5xweek OT Frequency: 5xweek            Contractures Contractures Info: Not present    Additional Factors Info  Code Status Code Status Info: full Allergies Info: Sulfa antibiotics, Biaxin, Influenza A Monoval Vac, Morphine, Pyridium, Ceftriaxone, Latex, Prednisone, Tape Psychotropic Info: Celexa, Klonopin Insulin Sliding Scale Info: 0-199=0, 200-500= 5 units, notify MD for blood sugar greater than or equal to 500       Current Medications (07/06/2019):  This is the current hospital active medication list Current Facility-Administered Medications  Medication Dose Route Frequency Provider Last Rate Last Dose  . acetaminophen (TYLENOL) tablet 650 mg  650 mg Oral Q6H PRN Lang Snow, NP   650 mg at 07/03/19 0311  . albuterol (PROVENTIL) (2.5 MG/3ML) 0.083% nebulizer solution 2.5 mg  2.5 mg Inhalation Q6H PRN Lang Snow, NP      . atorvastatin (LIPITOR) tablet 40 mg  40 mg Oral q1800 Lang Snow, NP   40 mg at 07/05/19 1645  . calcium acetate (PHOSLO) capsule 1,334 mg  1,334 mg Oral TID AC Lang Snow, NP   1,334 mg at 07/05/19 1644  . camphor-menthol (SARNA) lotion 1 application  1 application Topical Q3R PRN Lang Snow, NP      . Chlorhexidine Gluconate Cloth 2 % PADS 6 each  6 each Topical Q0600 Murlean Iba, MD   6 each at 07/05/19 0650  . citalopram (CELEXA) tablet 10 mg  10 mg Oral Daily Lang Snow, NP   10 mg at 07/05/19 1311  . clonazepam (KLONOPIN) disintegrating tablet 0.25 mg  0.25 mg Oral Q12H PRN Lang Snow, NP   0.25 mg at 07/03/19 2149  . docusate sodium (COLACE) capsule 100 mg  100 mg Oral BID Lang Snow, NP   100 mg at 07/05/19 2234  . ferric citrate (AURYXIA) tablet 420 mg  420 mg Oral TID WC Lang Snow, NP   420 mg at 07/05/19 1644  .  fluticasone (FLONASE) 50 MCG/ACT nasal spray 1 spray  1 spray Each Nare Daily Lang Snow, NP   1 spray at 07/05/19 0832  . heparin injection 5,000 Units  5,000 Units Subcutaneous Q8H Lang Snow, NP   5,000 Units at 07/06/19 0701  . hydrALAZINE (APRESOLINE) tablet 25 mg  25 mg Oral TID WC Lang Snow, NP   25 mg at 07/05/19 1644  . insulin aspart (novoLOG) injection 0-5 Units  0-5 Units Subcutaneous QHS Lang Snow, NP   2 Units at 07/05/19 2236  . insulin aspart (novoLOG) injection 0-9 Units  0-9 Units Subcutaneous TID WC Lang Snow, NP   3 Units at 07/05/19 1708  . insulin glargine (LANTUS) injection 25 Units  25 Units Subcutaneous QHS Lang Snow, NP   25 Units at 07/05/19 2235  . iron polysaccharides (NIFEREX) capsule 150 mg  150 mg Oral Daily Lang Snow, NP   150 mg at 07/05/19 1315  . lamoTRIgine (LAMICTAL) tablet 25 mg  25 mg Oral Daily Lang Snow, NP   25 mg at 07/05/19 1315  . levETIRAcetam (KEPPRA) tablet  250 mg  250 mg Oral BID Lang Snow, NP   250 mg at 07/05/19 2235  . loratadine (CLARITIN) tablet 10 mg  10 mg Oral Daily Lang Snow, NP   10 mg at 07/05/19 1313  . meclizine (ANTIVERT) tablet 12.5 mg  12.5 mg Oral TID Loletha Grayer, MD   12.5 mg at 07/05/19 2235  . Melatonin TABS 5 mg  5 mg Oral QHS Lang Snow, NP   5 mg at 07/05/19 2235  . metoCLOPramide (REGLAN) tablet 5 mg  5 mg Oral TID AC Loletha Grayer, MD   5 mg at 07/05/19 1644  . metoprolol tartrate (LOPRESSOR) tablet 50 mg  50 mg Oral BID Lang Snow, NP   50 mg at 07/05/19 2235  . mirabegron ER (MYRBETRIQ) tablet 25 mg  25 mg Oral Daily Lang Snow, NP   25 mg at 07/05/19 1317  . multivitamin with minerals tablet 1 tablet  1 tablet Oral Daily Lang Snow, NP   1 tablet at 07/05/19 1310  . nystatin (MYCOSTATIN/NYSTOP) topical powder 1 g  1 g Topical See  admin instructions Lang Snow, NP      . OLANZapine (ZYPREXA) tablet 5 mg  5 mg Oral QHS Lang Snow, NP   5 mg at 07/05/19 2235  . ondansetron (ZOFRAN-ODT) disintegrating tablet 8 mg  8 mg Oral Q8H PRN Lang Snow, NP   8 mg at 07/03/19 2233  . oxyCODONE-acetaminophen (PERCOCET/ROXICET) 5-325 MG per tablet 1 tablet  1 tablet Oral Q6H PRN Lang Snow, NP   1 tablet at 07/05/19 2312  . pantoprazole (PROTONIX) EC tablet 40 mg  40 mg Oral Daily Lang Snow, NP   40 mg at 07/05/19 1309  . polyethylene glycol (MIRALAX / GLYCOLAX) packet 17 g  17 g Oral Daily Lang Snow, NP   17 g at 07/05/19 1312  . saccharomyces boulardii (FLORASTOR) capsule 250 mg  250 mg Oral Daily Lang Snow, NP   250 mg at 07/05/19 1310  . senna-docusate (Senokot-S) tablet 1 tablet  1 tablet Oral QHS Lang Snow, NP   1 tablet at 07/05/19 2235  . sodium chloride flush (NS) 0.9 % injection 3 mL  3 mL Intravenous Q12H Lang Snow, NP   3 mL at 07/05/19 1317  . tiZANidine (ZANAFLEX) tablet 2 mg  2 mg Oral Q8H PRN Lang Snow, NP      . vitamin B-12 (CYANOCOBALAMIN) tablet 1,000 mcg  1,000 mcg Oral Daily Lang Snow, NP   1,000 mcg at 07/05/19 1317   Facility-Administered Medications Ordered in Other Encounters  Medication Dose Route Frequency Provider Last Rate Last Dose  . 0.9 %  sodium chloride infusion    Continuous PRN Dionne Bucy, CRNA         Discharge Medications: Please see discharge summary for a list of discharge medications.  Relevant Imaging Results:  Relevant Lab Results:   Additional Information SSN: [id number], MWF HD [place], [place]

## 2019-07-06 NOTE — TOC Transition Note (Addendum)
Transition of Care [facility]) - CM/SW Discharge Note   Patient Details  Name: ICHELLE HARRAL MRN: [id number] Date of Birth: Dec 19, 1940  [facility] CM/SW Contact:  Weston Anna, LCSW Phone Number: 07/06/2019, 2:24 PM   Clinical Narrative:     Patient set to return to [facility] today. CSW notified patients sister, Mardene Celeste. Patient will need transportation via EMS. Please call report to [phone].   DC and fl2 faxed to facility.   Patient active with [facility]- Helene Kelp notified to continue services. MD placed orders for PT/RN.   Final next level of care: Assisted Living Barriers to Discharge: No Barriers Identified   Patient Goals and CMS Choice        Discharge Placement                Patient to be transferred to facility by: [facility] Name of family member notified: sister- patricia Patient and family notified of of transfer: 07/06/19  Discharge Plan and Services     Post Acute Care Choice: Resumption of Svcs/PTA Provider          DME Arranged: N/A         HH Arranged: PT, RN [place] Agency: [facility] (formerly [employer]) Date [place]: 07/06/19 Time [place]: 57 Representative spoke with at [place]: Teresa(Already active)  Social Determinants of Health ([place]) Interventions     Readmission Risk Interventions Readmission Risk Prevention Plan 07/04/2019 03/19/2019  Transportation Screening Complete Complete  Medication Review Press photographer) Complete Complete  PCP or Specialist appointment within 3-5 days of discharge Complete Complete  HRI or [place] Complete Complete  SW Recovery Care/Counseling Consult Complete Not Complete  SW Consult Not Complete Comments - na  Palliative Care Screening Not Applicable Not [place] Patient Refused Not Applicable  Some recent data might be hidden

## 2019-07-06 NOTE — Progress Notes (Signed)
Report given to Lattie Haw, RN at [place] [facility] has been called for report.

## 2019-07-06 NOTE — Progress Notes (Signed)
HD Tx started    07/06/19 0830  Vital Signs  Pulse Rate (!) 49  Pulse Rate Source Monitor  Resp 16  BP (!) 160/51  BP Location Right Arm  BP Method Automatic  Patient Position (if appropriate) Lying  Oxygen Therapy  SpO2 100 %  O2 Device Nasal Cannula  O2 Flow Rate (L/min) 3 L/min  During Hemodialysis Assessment  Blood Flow Rate (mL/min) 300 mL/min  Arterial Pressure (mmHg) -120 mmHg  Venous Pressure (mmHg) 140 mmHg  Transmembrane Pressure (mmHg) 50 mmHg  Ultrafiltration Rate (mL/min) 430 mL/min  Dialysate Flow Rate (mL/min) 600 ml/min  Conductivity: Machine  14  HD Safety Checks Performed Yes  Dialysis Fluid Bolus Normal Saline  Bolus Amount (mL) 250 mL  Intra-Hemodialysis Comments Tx initiated

## 2019-07-06 NOTE — Progress Notes (Signed)
Post HD Tx completion assessment Pt tolerated well the Tx. The AP was getting gighre during Tx. Prescribed blood flow rate could not be maintained. Actual BFR 300. Nephrologist is notified. Pt reported light headed. Pt received 273ml bolus of NS during Tx for low diastolic BP. BG level was checked and was 192 during Tx. Nephrologist asked to hold Insulin until pt finishes dialysis. Pt remained stable for the rest of the Tx. Catheter is heparin locked, capped and clapped post tx.   07/06/19 1230  Hand-Off documentation  Report given to (Full Name) Stacie Glaze RN  Report received from (Full Name) Newt Minion RN  Vital Signs  Temp 97.8 F (36.6 C)  Temp Source Oral  Pulse Rate (!) 59  Pulse Rate Source Monitor  Resp 16  BP (!) 146/43  BP Location Right Arm  BP Method Automatic  Patient Position (if appropriate) Lying  Oxygen Therapy  SpO2 100 %  O2 Device Nasal Cannula  O2 Flow Rate (L/min) 3 L/min  Post-Hemodialysis Assessment  Rinseback Volume (mL) 250 mL  KECN 61.6 V  Dialyzer Clearance Lightly streaked  Duration of HD Treatment -hour(s) 3.5 hour(s)  Hemodialysis Intake (mL) 500 mL  UF Total -Machine (mL) 1500 mL  Net UF (mL) 1000 mL  Tolerated HD Treatment Yes  Post-Hemodialysis Comments  (Pt tolerated well the Tx)  AVG/AVF Arterial Site Held (minutes)  (NA)  AVG/AVF Venous Site Held (minutes)  (NA)  Hemodialysis Catheter Right Subclavian Double lumen Permanent (Tunneled)  No Placement Date or Time found.   Placed prior to admission: Yes  Orientation: Right  Access Location: Subclavian  Hemodialysis Catheter Type: Double lumen Permanent (Tunneled)  Site Condition No complications  Blue Lumen Status Heparin locked  Red Lumen Status Heparin locked  Purple Lumen Status N/A  Catheter fill solution Heparin 1000 units/ml  Catheter fill volume (Arterial) 1.6 cc  Catheter fill volume (Venous) 1.6  Dressing Type Biopatch;Occlusive  Dressing Status Clean;Dry;Intact  Drainage  Description None  Dressing Change Due 07/10/19  Post treatment catheter status Capped and Clamped

## 2019-07-06 NOTE — Care Management Important Message (Signed)
Important Message  Patient Details  Name: Carolyn Valdez MRN: [id number] Date of Birth: 1941/08/18   Medicare Important Message Given:  Yes     Dannette Barbara 07/06/2019, 11:21 AM

## 2019-07-06 NOTE — Progress Notes (Signed)
HD Tx Completed   07/06/19 1215  Vital Signs  Pulse Rate (!) 57  Pulse Rate Source Monitor  Resp 18  BP (!) 143/52  BP Location Right Arm  BP Method Automatic  Patient Position (if appropriate) Lying  Oxygen Therapy  SpO2 100 %  O2 Device Nasal Cannula  O2 Flow Rate (L/min) 3 L/min  During Hemodialysis Assessment  Blood Flow Rate (mL/min) 300 mL/min  Arterial Pressure (mmHg) -140 mmHg  Venous Pressure (mmHg) 110 mmHg  Transmembrane Pressure (mmHg) 30 mmHg  Ultrafiltration Rate (mL/min) 430 mL/min  Dialysate Flow Rate (mL/min) 600 ml/min  Conductivity: Machine  14  HD Safety Checks Performed Yes  KECN 61.6 KECN  Dialysis Fluid Bolus Normal Saline  Bolus Amount (mL) 250 mL  Intra-Hemodialysis Comments Tx completed

## 2019-07-06 NOTE — Plan of Care (Signed)
?  Problem: Fluid Volume: ?Goal: Compliance with measures to maintain balanced fluid volume will improve ?Outcome: Progressing ?  ?Problem: Clinical Measurements: ?Goal: Complications related to the disease process, condition or treatment will be avoided or minimized ?Outcome: Progressing ?  ?

## 2019-07-06 NOTE — Progress Notes (Signed)
EMS is here to pick up patient. Previous RN called report. Discontinue PIV and telemetry monitor. Patient discharge with right subclavian permcath.

## 2019-07-07 LAB — HEPATITIS B SURFACE ANTIGEN: Hepatitis B Surface Ag: NEGATIVE

## 2019-07-24 ENCOUNTER — Ambulatory Visit (INDEPENDENT_AMBULATORY_CARE_PROVIDER_SITE_OTHER): Payer: Medicare Other

## 2019-07-24 ENCOUNTER — Ambulatory Visit (INDEPENDENT_AMBULATORY_CARE_PROVIDER_SITE_OTHER): Payer: Medicare Other | Admitting: Vascular Surgery

## 2019-07-24 ENCOUNTER — Encounter (INDEPENDENT_AMBULATORY_CARE_PROVIDER_SITE_OTHER): Payer: Self-pay | Admitting: Vascular Surgery

## 2019-07-24 ENCOUNTER — Other Ambulatory Visit: Payer: Self-pay

## 2019-07-24 VITALS — BP 119/66 | HR 56 | Resp 16

## 2019-07-24 DIAGNOSIS — Z992 Dependence on renal dialysis: Secondary | ICD-10-CM | POA: Diagnosis not present

## 2019-07-24 DIAGNOSIS — N186 End stage renal disease: Secondary | ICD-10-CM

## 2019-07-24 DIAGNOSIS — Z794 Long term (current) use of insulin: Secondary | ICD-10-CM

## 2019-07-24 DIAGNOSIS — E1122 Type 2 diabetes mellitus with diabetic chronic kidney disease: Secondary | ICD-10-CM

## 2019-07-24 DIAGNOSIS — I10 Essential (primary) hypertension: Secondary | ICD-10-CM | POA: Diagnosis not present

## 2019-07-24 NOTE — Assessment & Plan Note (Signed)
Likely an underlying cause of her ESRD and blood glucose control important in reducing the progression of atherosclerotic disease. Also, involved in wound healing. On appropriate medications.

## 2019-07-24 NOTE — Assessment & Plan Note (Signed)
Vein mapping today shows adequate cephalic vein at the antecubital fossa as well as at least marginal cephalic vein at the wrist on the left.  Her waveforms are triphasic but her radial and ulnar arteries are fairly small at the wrist..   Given these findings and the fact that her catheter really is not working well, I would lean towards performing a brachiocephalic AV fistula as it would mature faster and likely be usable for dialysis more readily.  I discussed the risks and benefits of the procedure.  I discussed using her left arm would be preferred with her previous right mastectomy and the fact this is her dominant arm.  She voices her understanding and is agreeable to proceed.

## 2019-07-24 NOTE — Patient Instructions (Signed)
AV Fistula Placement  Arteriovenous (AV) fistula placement is a surgical procedure to create a connection between a blood vessel that carries blood away from your heart (artery) and a blood vessel that returns blood to your heart (vein). This connection is called a fistula. It is often made in the forearm or upper arm. You may need this procedure if you are getting hemodialysis treatments for kidney disease. An AV fistula makes your vein larger and stronger over several months. This makes the vein a safe and easy spot to insert the needles that are used for hemodialysis. Tell a health care provider about:  Any allergies you have.  All medicines you are taking, including vitamins, herbs, eye drops, creams, and over-the-counter medicines.  Any problems you or family members have had with anesthetic medicines.  Any blood disorders you have.  Any surgeries you have had.  Any medical conditions you have or have had in the past. What are the risks? Generally, this is a safe procedure. However, problems may occur, including:  Infection.  Blood clot (thrombosis).  Reduced blood flow (stenosis).  Weakening or ballooning out of the fistula (aneurysm).  Bleeding.  Allergic reactions to medicines.  Nerve damage.  Swelling near the fistula (lymphedema).  Weakening of your heart (congestive heart failure).  Failure of the procedure. What happens before the procedure? Staying hydrated Follow instructions from your health care provider about hydration, which may include:  Up to 2 hours before the procedure - you may continue to drink clear liquids, such as water, clear fruit juice, black coffee, and plain tea.  Eating and drinking restrictions Follow instructions from your health care provider about eating and drinking, which may include:  8 hours before the procedure - stop eating heavy meals or foods, such as meat, fried foods, or fatty foods.  6 hours before the procedure - stop  eating light meals or foods, such as toast or cereal.  6 hours before the procedure - stop drinking milk or drinks that contain milk.  2 hours before the procedure - stop drinking clear liquids. Medicines Ask your health care provider about:  Changing or stopping your regular medicines. This is especially important if you are taking diabetes medicines or blood thinners.  Taking medicines such as aspirin and ibuprofen. These medicines can thin your blood. Do not take these medicines unless your health care provider tells you to take them.  Taking over-the-counter medicines, vitamins, herbs, and supplements. General instructions  Imaging tests of your arm may be done to find the best place for the fistula.  Plan to have someone take you home from the hospital or clinic.  Ask your health care provider how your surgical site will be marked or identified.  Ask your health care provider what steps will be taken to help prevent infection. These may include: ? Removing hair at the surgery site. ? Washing skin with a germ-killing soap. ? Taking antibiotic medicine.  Do not use any products that contain nicotine or tobacco for as long as possible before and after the procedure. These products include cigarettes, e-cigarettes, and chewing tobacco. If you need help quitting, ask your health care provider. What happens during the procedure?  An IV will be inserted into one of your veins.  You will be given one or more of the following: ? A medicine to help you relax (sedative). ? A medicine to numb the area (local anesthetic). ? A medicine to make you fall asleep (general anesthetic). ? A medicine that  is injected into an area of your body to numb everything below the injection site (regional anesthetic).  The fistula site will be cleaned with a germ-killing solution (antiseptic).  An incision will be made on the inner side of your arm.  A vein and an artery will be opened and connected  with stitches (sutures).  The incision will be closed with sutures or clips.  A bandage (dressing) will be placed over the area. The procedure may vary among health care providers and hospitals. What happens after the procedure?  Your blood pressure, heart rate, breathing rate, and blood oxygen level may be monitored until you leave the hospital or clinic.  Your fistula site will be checked for bleeding or swelling.  You will be given pain medicine as needed.  Do not drive for 24 hours if you were given a sedative during your procedure. Summary  Arteriovenous (AV) fistula placement is a surgical procedure to create a connection between a blood vessel that carries blood away from your heart (artery) and a blood vessel that returns blood to your heart (vein). This connection is called a fistula.  Follow instructions from your health care provider about eating and drinking before the procedure.  Ask your health care provider about changing or stopping your regular medicines before the procedure. This is especially important if you are taking diabetes medicines or blood thinners.  Do not drive for 24 hours if you were given a sedative during your procedure.  Plan to have someone take you home from the hospital or clinic. This information is not intended to replace advice given to you by your health care provider. Make sure you discuss any questions you have with your health care provider. Document Released: 09/15/2015 Document Revised: 04/10/2018 Document Reviewed: 04/10/2018 Elsevier Patient Education  2020 [employer]  

## 2019-07-24 NOTE — Assessment & Plan Note (Signed)
blood pressure control important in reducing the progression of atherosclerotic disease. On appropriate oral medications.  

## 2019-07-24 NOTE — Progress Notes (Signed)
Patient ID: Carolyn Valdez, female   DOB: [birth year], 78 y.o.   MRN: [id number]  Chief Complaint  Patient presents with  . New Patient (Initial Visit)    ref lateef for vein mapping    HPI Carolyn Valdez is a 78 y.o. female.  I am asked to see the patient by Dr. Holley Raring for evaluation of permanent dialysis access with vein mapping.  She has been catheter dependent for several months now.  She gets her dialysis on Mondays, Wednesdays, and Fridays through her jugular catheter.  She says that the machine alarms all the time and her dialysis runs long.  She is right-hand dominant and has had a mastectomy on the right.  She has no specific complaints today.  Vein mapping today shows adequate cephalic vein at the antecubital fossa as well as at least marginal cephalic vein at the wrist on the left.  Her waveforms are triphasic but her radial and ulnar arteries are fairly small at the wrist..     Past Medical History:  Diagnosis Date  . (HFpEF) heart failure with preserved ejection fraction ([place])    a. 06/2018 Echo: EF 60-65%, no rwma. Gr2 DD. Mild LVH. Ao sclerosis w/o stenosis. Mild MR. Mild to mod LAE. Nl RV fxn. Mild RAE. Mod TR. PASP 60-92mmHg.  Marland Kitchen Anxiety   . Breast cancer ([place]) 1997   a. 1997 s/p right breast mastectomy  . Chest pain    a. 11/2008 MV: No ischemia. EF 66%.  . CKD (chronic kidney disease) stage 4, GFR 15-29 ml/min (HCC)   . Coronary artery calcification seen on CT scan    a. 01/2017 Chest CT: Atherosclerotic coronary Ca2+.  . Diabetes mellitus without complication ([place])   . DJD (degenerative joint disease)   . Dysuria   . GIB (gastrointestinal bleeding)    a. 07/2018 EGD: Gastritis w/ hemorrhage, duodenal angioectasia. Friable gastric mucosa w/ bleeding on contact. All treated w/ argon plasma coagulation; b. 07/2018 s/p 2u PRBCs.  . H/O total knee replacement    right  . HTN (hypertension)   . Hypercholesteremia   . Kidney stone   . Obesity   . Recurrent urinary tract  infection   . SVT (supraventricular tachycardia) ([place])   . Urinary incontinence   . Vaginal atrophy   . Yeast vaginitis     Past Surgical History:  Procedure Laterality Date  . CARDIAC CATHETERIZATION    . CHOLECYSTECTOMY    . COLONOSCOPY N/A 08/06/2018   Procedure: COLONOSCOPY;  Surgeon: Virgel Manifold, MD;  Location: [facility] ENDOSCOPY;  Service: Endoscopy;  Laterality: N/A;  . DIALYSIS/PERMA CATHETER INSERTION N/A 03/16/2019   Procedure: DIALYSIS/PERMA CATHETER INSERTION;  Surgeon: Algernon Huxley, MD;  Location: [place] [facility];  Service: Cardiovascular;  Laterality: N/A;  . DIALYSIS/PERMA CATHETER INSERTION N/A 05/29/2019   Procedure: DIALYSIS/PERMA CATHETER INSERTION;  Surgeon: Katha Cabal, MD;  Location: [place] [facility];  Service: Cardiovascular;  Laterality: N/A;  . ESOPHAGOGASTRODUODENOSCOPY N/A 08/06/2018   Procedure: ESOPHAGOGASTRODUODENOSCOPY (EGD);  Surgeon: Virgel Manifold, MD;  Location[facility] ENDOSCOPY;  Service: Endoscopy;  Laterality: N/A;  . FOOT SURGERY    . JOINT REPLACEMENT    . MASTECTOMY Right 1997  . NASAL SEPTUM SURGERY    . PERIPHERAL VASCULAR CATHETERIZATION N/A 07/08/2015   Procedure: PICC Line Insertion;  Surgeon: Algernon Huxley, MD;  Location: [place] [facility];  Service: Cardiovascular;  Laterality: N/A;  . TEMPORARY DIALYSIS CATHETER N/A 03/09/2019   Procedure:  TEMPORARY DIALYSIS CATHETER INSERTION;  Surgeon: Algernon Huxley, MD;  Location: [facility] [facility];  Service: Cardiovascular;  Laterality: N/A;  . TONSILLECTOMY    . Vocal cords      Family History Family History  Problem Relation Age of Onset  . Lung cancer Father   . Hematuria Mother   . Lung cancer Mother   . Kidney disease Neg Hx   . Bladder Cancer Neg Hx   . Breast cancer Neg Hx     Social History Social History   Tobacco Use  . Smoking status: Former Research scientist (life sciences)  . Smokeless tobacco: Former Systems developer    Quit date: 10/29/1995  Substance Use Topics  . Alcohol use: No     Alcohol/week: 0.0 standard drinks  . Drug use: No     Allergies  Allergen Reactions  . Sulfa Antibiotics Hives  . Biaxin [Clarithromycin] Hives  . Influenza A (H1n1) Monoval Vac Other (See Comments)    Pt states that she was told by her MD not to get the influenza vaccine.    . Morphine Other (See Comments)    Reaction:  Dizziness and confusion   . Pyridium [Phenazopyridine Hcl] Other (See Comments)    Reaction:  Unknown   . Ceftriaxone Anxiety  . Latex Rash  . Prednisone Rash  . Tape Rash    Current Outpatient Medications  Medication Sig Dispense Refill  . acetaminophen (TYLENOL) 325 MG tablet Take 2 tablets (650 mg total) by mouth every 6 (six) hours as needed for mild pain (or Fever >/= 101).    Marland Kitchen albuterol (PROVENTIL HFA;VENTOLIN HFA) 108 (90 Base) MCG/ACT inhaler Inhale 2 puffs into the lungs every 6 (six) hours as needed for wheezing or shortness of breath. 1 Inhaler 1  . atorvastatin (LIPITOR) 40 MG tablet Take 1 tablet (40 mg total) by mouth daily at 6 PM. (Patient taking differently: Take 40 mg by mouth at bedtime. )    . calcium acetate (PHOSLO) 667 MG capsule Take 1,334 mg by mouth 3 (three) times daily before meals.    . camphor-menthol (MEN-PHOR) lotion Apply 1 application topically every 6 (six) hours as needed for itching.    . citalopram (CELEXA) 10 MG tablet Take 1 tablet (10 mg total) by mouth daily. 30 tablet 0  . clonazePAM (KLONOPIN) 0.25 MG disintegrating tablet Take 1 tablet (0.25 mg total) by mouth every 12 (twelve) hours as needed (anxiety). 20 tablet 0  . docusate sodium (COLACE) 100 MG capsule Take 100 mg by mouth 2 (two) times daily.     . ferric citrate (AURYXIA) 1 GM 210 MG(Fe) tablet Take 420 mg by mouth 3 (three) times daily with meals.     . fluticasone (FLONASE) 50 MCG/ACT nasal spray Place 1 spray into both nostrils daily.     Marland Kitchen glipiZIDE (GLUCOTROL) 5 MG tablet Take 5 mg by mouth daily before breakfast.    . hydrALAZINE (APRESOLINE) 25 MG  tablet Take 1 tablet (25 mg total) by mouth 3 (three) times daily with meals.    . insulin aspart (NOVOLOG) 100 UNIT/ML injection 3 (three) times daily before meals. Inject per sliding scale 0-199=0, 200-500=5 units, notify MD for blood sugar> or equal to 500    . insulin glargine (LANTUS) 100 UNIT/ML injection Inject 0.4 mLs (40 Units total) into the skin daily. (Patient taking differently: Inject 45 Units into the skin at bedtime. ) 100 mL 0  . lamoTRIgine (LAMICTAL) 25 MG tablet Take 1 tablet (25 mg total)  by mouth daily. 30 tablet 0  . levETIRAcetam (KEPPRA) 250 MG tablet Take 1 tablet (250 mg total) by mouth 2 (two) times daily. 30 tablet 0  . loratadine (CLARITIN) 10 MG tablet Take 10 mg by mouth daily.    Marland Kitchen losartan (COZAAR) 50 MG tablet Take 50 mg by mouth daily.    . Melatonin 3 MG TABS Take 6 mg by mouth at bedtime.    . Meth-Hyo-M Bl-Na Phos-Ph Sal (URIBEL) 118 MG CAPS Take 118 mg by mouth every 6 (six) hours as needed (uti symptoms).    . metoprolol tartrate (LOPRESSOR) 50 MG tablet Take 50 mg by mouth 2 (two) times daily.    . mirabegron ER (MYRBETRIQ) 25 MG TB24 tablet Take 25 mg by mouth daily.    . Multiple Vitamin (MULTIVITAMIN) tablet Take 1 tablet by mouth daily.    Marland Kitchen nystatin (NYSTATIN) powder Apply 1 g topically See admin instructions. Apply to under breast and abdominal once daily & apply every 12 hours as needed for abdominal folds, groin, and under breast    . OLANZapine (ZYPREXA) 5 MG tablet Take 5 mg by mouth at bedtime.    . ondansetron (ZOFRAN-ODT) 8 MG disintegrating tablet Take 8 mg by mouth every 8 (eight) hours as needed for nausea/vomiting.    Marland Kitchen oxyCODONE-acetaminophen (PERCOCET/ROXICET) 5-325 MG tablet Take 1 tablet by mouth every 6 (six) hours as needed for moderate pain or severe pain. 30 tablet 0  . pantoprazole (PROTONIX) 40 MG tablet Take 40 mg by mouth daily.     . polyethylene glycol (MIRALAX / GLYCOLAX) packet Take 17 g by mouth daily. 14 each 0  .  saccharomyces boulardii (FLORASTOR) 250 MG capsule Take 250 mg by mouth daily.    . sennosides-docusate sodium (SENOKOT-S) 8.6-50 MG tablet Take 1 tablet by mouth at bedtime.    Marland Kitchen tiZANidine (ZANAFLEX) 2 MG tablet Take 2 mg by mouth every 8 (eight) hours as needed for muscle spasms.    Marland Kitchen torsemide (DEMADEX) 20 MG tablet Take 2 tablets (40 mg total) by mouth daily. (Patient taking differently: Take 20 mg by mouth daily. ) 30 tablet 0  . vitamin B-12 (CYANOCOBALAMIN) 1000 MCG tablet Take 1,000 mcg by mouth daily.    . iron polysaccharides (NIFEREX) 150 MG capsule Take 150 mg by mouth daily.    Marland Kitchen levofloxacin (LEVAQUIN) 250 MG tablet      No current facility-administered medications for this visit.    Facility-Administered Medications Ordered in Other Visits  Medication Dose Route Frequency Provider Last Rate Last Dose  . 0.9 %  sodium chloride infusion    Continuous PRN Dionne Bucy, CRNA          REVIEW OF SYSTEMS (Negative unless checked)  Constitutional: [] Weight loss  [] Fever  [] Chills Cardiac: [] Chest pain   [] Chest pressure   [] Palpitations   [] Shortness of breath when laying flat   [] Shortness of breath at rest   [x] Shortness of breath with exertion. Vascular:  [] Pain in legs with walking   [] Pain in legs at rest   [] Pain in legs when laying flat   [] Claudication   [] Pain in feet when walking  [] Pain in feet at rest  [] Pain in feet when laying flat   [] History of DVT   [] Phlebitis   [x] Swelling in legs   [] Varicose veins   [] Non-healing ulcers Pulmonary:   [] Uses home oxygen   [] Productive cough   [] Hemoptysis   [] Wheeze  [] COPD   [] Asthma Neurologic:  [] Dizziness  []   Blackouts   [] Seizures   [] History of stroke   [] History of TIA  [] Aphasia   [] Temporary blindness   [] Dysphagia   [] Weakness or numbness in arms   [] Weakness or numbness in legs Musculoskeletal:  [] Arthritis   [] Joint swelling   [] Joint pain   [] Low back pain Hematologic:  [] Easy bruising  [] Easy bleeding    [] Hypercoagulable state   [x] Anemic  [] Hepatitis Gastrointestinal:  [] Blood in stool   [] Vomiting blood  [x] Gastroesophageal reflux/heartburn   [] Abdominal pain Genitourinary:  [x] Chronic kidney disease   [] Difficult urination  [] Frequent urination  [] Burning with urination   [] Hematuria Skin:  [] Rashes   [] Ulcers   [] Wounds Psychological:  [] History of anxiety   []  History of major depression.    Physical Exam BP 119/66 (BP Location: Right Arm)   Pulse (!) 56   Resp 16  Gen:  WD/WN, NAD Head: [place]/AT, No temporal wasting Ear/Nose/Throat: Hearing grossly intact, nares w/o erythema or drainage, oropharynx w/o Erythema/Exudate Eyes: Conjunctiva clear, sclera non-icteric  Neck: trachea midline.  No JVD.  Pulmonary:  Good air movement, respirations not labored, no use of accessory muscles  Cardiac: RRR, no JVD Vascular: PermCath in place in right subclavicular location without erythema Vessel Right Left  Radial Palpable Palpable                                   Gastrointestinal:. No masses, surgical incisions, or scars. Musculoskeletal: M/S 5/5 throughout.  Extremities without ischemic changes.  No deformity or atrophy.  1-2+ bilateral lower extremity edema.  Allen's test normal on the left arm Neurologic: Sensation grossly intact in extremities.  Symmetrical.  Speech is fluent. Motor exam as listed above. Psychiatric: Judgment intact, Mood & affect appropriate for pt's clinical situation. Dermatologic: No rashes or ulcers noted.  No cellulitis or open wounds.    Radiology Ct Head Wo Contrast  Result Date: 07/03/2019 CLINICAL DATA:  78 year old female with syncope. EXAM: CT HEAD WITHOUT CONTRAST TECHNIQUE: Contiguous axial images were obtained from the base of the skull through the vertex without intravenous contrast. COMPARISON:  Brain MRI 10/22/2016, head CT 07/04/2018. FINDINGS: Brain: Stable cerebral volume since 2019. No midline shift, ventriculomegaly, mass effect, evidence  of mass lesion, intracranial hemorrhage or evidence of cortically based acute infarction. Stable gray-white matter differentiation throughout the brain. Patchy periventricular hypodensity similar to the 2018 MRI FLAIR signal changes. Vascular: Extensive Calcified atherosclerosis at the skull base. No suspicious intracranial vascular hyperdensity. Skull: No acute osseous abnormality identified. Sinuses/Orbits: Visualized paranasal sinuses and mastoids are stable and well pneumatized. Other: No acute orbit or scalp soft tissue findings. There is some calcified scalp vessel atherosclerosis. IMPRESSION: No acute intracranial abnormality. Stable non contrast CT appearance of the brain since 2019. Electronically Signed   By: Genevie Ann M.D.   On: 07/03/2019 01:42   Dg Chest Portable 1 View  Result Date: 07/02/2019 CLINICAL DATA:  Syncope EXAM: PORTABLE CHEST 1 VIEW COMPARISON:  03/03/2019 FINDINGS: Right-sided central venous catheter tip over the right atrium. Cardiomegaly with central vascular congestion. Diffuse interstitial prominence likely due to chronic change. No overt edema. Aortic atherosclerosis. No consolidation or pneumothorax. IMPRESSION: Cardiomegaly with vascular congestion but no overt pulmonary edema. No focal airspace disease. Electronically Signed   By: Donavan Foil M.D.   On: 07/02/2019 16:22    Labs Recent Results (from the past 2160 hour(s))  SARS Coronavirus 2 [facility] order, Performed in [facility] hospital  lab) Nasopharyngeal Nasopharyngeal Swab     Status: None   Collection Time: 05/29/19 10:01 AM   Specimen: Nasopharyngeal Swab  Result Value Ref Range   SARS Coronavirus 2 NEGATIVE NEGATIVE    Comment: (NOTE) If result is NEGATIVE SARS-CoV-2 target nucleic acids are NOT DETECTED. The SARS-CoV-2 RNA is generally detectable in upper and lower  respiratory specimens during the acute phase of infection. The lowest  concentration of SARS-CoV-2 viral copies this assay can detect is  250  copies / mL. A negative result does not preclude SARS-CoV-2 infection  and should not be used as the sole basis for treatment or other  patient management decisions.  A negative result may occur with  improper specimen collection / handling, submission of specimen other  than nasopharyngeal swab, presence of viral mutation(s) within the  areas targeted by this assay, and inadequate number of viral copies  (<250 copies / mL). A negative result must be combined with clinical  observations, patient history, and epidemiological information. If result is POSITIVE SARS-CoV-2 target nucleic acids are DETECTED. The SARS-CoV-2 RNA is generally detectable in upper and lower  respiratory specimens dur ing the acute phase of infection.  Positive  results are indicative of active infection with SARS-CoV-2.  Clinical  correlation with patient history and other diagnostic information is  necessary to determine patient infection status.  Positive results do  not rule out bacterial infection or co-infection with other viruses. If result is PRESUMPTIVE POSTIVE SARS-CoV-2 nucleic acids MAY BE PRESENT.   A presumptive positive result was obtained on the submitted specimen  and confirmed on repeat testing.  While 2019 novel coronavirus  (SARS-CoV-2) nucleic acids may be present in the submitted sample  additional confirmatory testing may be necessary for epidemiological  and / or clinical management purposes  to differentiate between  SARS-CoV-2 and other Sarbecovirus currently known to infect humans.  If clinically indicated additional testing with an alternate test  methodology [phone]) is advised. The SARS-CoV-2 RNA is generally  detectable in upper and lower respiratory sp ecimens during the acute  phase of infection. The expected result is Negative. Fact Sheet for Patients:  StrictlyIdeas.no Fact Sheet for Healthcare Providers:  BankingDealers.co.za This test is not yet approved or cleared by the [place] FDA and has been authorized for detection and/or diagnosis of SARS-CoV-2 by FDA under an Emergency Use Authorization (EUA).  This EUA will remain in effect (meaning this test can be used) for the duration of the COVID-19 declaration under Section 564(b)(1) of the Act, 21 U.S.C. section 360bbb-3(b)(1), unless the authorization is terminated or revoked sooner. Performed at [facility], [address] [place], [place] [address]   Glucose, capillary     Status: Abnormal   Collection Time: 05/29/19  1:47 PM  Result Value Ref Range   Glucose-Capillary 168 (H) 70 - 99 mg/dL  CBC with Differential/Platelet     Status: Abnormal   Collection Time: 07/02/19  4:18 PM  Result Value Ref Range   WBC 14.0 (H) 4.0 - 10.5 K/uL   RBC 4.31 3.87 - 5.11 MIL/uL   Hemoglobin 12.1 12.0 - 15.0 g/dL   HCT 39.6 36.0 - 46.0 %   MCV 91.9 80.0 - 100.0 fL   MCH 28.1 26.0 - 34.0 pg   MCHC 30.6 30.0 - 36.0 g/dL   RDW 14.6 11.5 - 15.5 %   Platelets 226 150 - 400 K/uL   nRBC 0.0 0.0 - 0.2 %   Neutrophils Relative % 76 %  Neutro Abs 10.7 (H) 1.7 - 7.7 K/uL   Lymphocytes Relative 10 %   Lymphs Abs 1.4 0.7 - 4.0 K/uL   Monocytes Relative 10 %   Monocytes Absolute 1.4 (H) 0.1 - 1.0 K/uL   Eosinophils Relative 2 %   Eosinophils Absolute 0.3 0.0 - 0.5 K/uL   Basophils Relative 1 %   Basophils Absolute 0.1 0.0 - 0.1 K/uL   Immature Granulocytes 1 %   Abs Immature Granulocytes 0.11 (H) 0.00 - 0.07 K/uL    Comment: Performed at [facility], [address] [place], [place] [address]  Comprehensive metabolic panel     Status: Abnormal   Collection Time: 07/02/19  4:18 PM  Result Value Ref Range   Sodium 134 (L) 135 - 145 mmol/L   Potassium 4.7 3.5 - 5.1 mmol/L   Chloride 97 (L) 98 - 111 mmol/L   CO2 21 (L) 22 - 32 mmol/L   Glucose, Bld 216 (H) 70 - 99 mg/dL   BUN 29 (H) 8 - 23 mg/dL    Creatinine, Ser 4.04 (H) 0.44 - 1.00 mg/dL   Calcium 8.9 8.9 - 10.3 mg/dL   Total Protein 7.4 6.5 - 8.1 g/dL   Albumin 3.9 3.5 - 5.0 g/dL   AST 19 15 - 41 U/L   ALT 10 0 - 44 U/L   Alkaline Phosphatase 108 38 - 126 U/L   Total Bilirubin 0.4 0.3 - 1.2 mg/dL   GFR calc non Af Amer 10 (L) >60 mL/min   GFR calc Af Amer 12 (L) >60 mL/min   Anion gap 16 (H) 5 - 15    Comment: Performed at [facility], [address] [place], [place] [address]  Brain natriuretic peptide     Status: None   Collection Time: 07/02/19  5:34 PM  Result Value Ref Range   B Natriuretic Peptide 97.0 0.0 - 100.0 pg/mL    Comment: Performed at [facility], [place], [place] [address]  Troponin I (High Sensitivity)     Status: Abnormal   Collection Time: 07/02/19  7:43 PM  Result Value Ref Range   Troponin I (High Sensitivity) 89 (H) <18 ng/L    Comment: (NOTE) Elevated high sensitivity troponin I (hsTnI) values and significant  changes across serial measurements may suggest ACS but many other  chronic and acute conditions are known to elevate hsTnI results.  Refer to the "Links" section for chest pain algorithms and additional  guidance. Performed at [facility], [address] [place], [place] [address]   SARS Coronavirus 2 [facility] order, Performed in [facility] hospital lab) Nasopharyngeal Nasopharyngeal Swab     Status: None   Collection Time: 07/02/19  7:43 PM   Specimen: Nasopharyngeal Swab  Result Value Ref Range   SARS Coronavirus 2 NEGATIVE NEGATIVE    Comment: (NOTE) If result is NEGATIVE SARS-CoV-2 target nucleic acids are NOT DETECTED. The SARS-CoV-2 RNA is generally detectable in upper and lower  respiratory specimens during the acute phase of infection. The lowest  concentration of SARS-CoV-2 viral copies this assay can detect is 250  copies / mL. A negative result does not preclude SARS-CoV-2 infection  and should not be used as the sole  basis for treatment or other  patient management decisions.  A negative result may occur with  improper specimen collection / handling, submission of specimen other  than nasopharyngeal swab, presence of viral mutation(s) within the  areas targeted by this assay,  and inadequate number of viral copies  (<250 copies / mL). A negative result must be combined with clinical  observations, patient history, and epidemiological information. If result is POSITIVE SARS-CoV-2 target nucleic acids are DETECTED. The SARS-CoV-2 RNA is generally detectable in upper and lower  respiratory specimens dur ing the acute phase of infection.  Positive  results are indicative of active infection with SARS-CoV-2.  Clinical  correlation with patient history and other diagnostic information is  necessary to determine patient infection status.  Positive results do  not rule out bacterial infection or co-infection with other viruses. If result is PRESUMPTIVE POSTIVE SARS-CoV-2 nucleic acids MAY BE PRESENT.   A presumptive positive result was obtained on the submitted specimen  and confirmed on repeat testing.  While 2019 novel coronavirus  (SARS-CoV-2) nucleic acids may be present in the submitted sample  additional confirmatory testing may be necessary for epidemiological  and / or clinical management purposes  to differentiate between  SARS-CoV-2 and other Sarbecovirus currently known to infect humans.  If clinically indicated additional testing with an alternate test  methodology [phone]) is advised. The SARS-CoV-2 RNA is generally  detectable in upper and lower respiratory sp ecimens during the acute  phase of infection. The expected result is Negative. Fact Sheet for Patients:  StrictlyIdeas.no Fact Sheet for Healthcare Providers: BankingDealers.co.za This test is not yet approved or cleared by the [place] FDA and has been authorized for detection  and/or diagnosis of SARS-CoV-2 by FDA under an Emergency Use Authorization (EUA).  This EUA will remain in effect (meaning this test can be used) for the duration of the COVID-19 declaration under Section 564(b)(1) of the Act, 21 U.S.C. section 360bbb-3(b)(1), unless the authorization is terminated or revoked sooner. Performed at [facility], [place], [place] [address]   Troponin I (High Sensitivity)     Status: Abnormal   Collection Time: 07/02/19 10:11 PM  Result Value Ref Range   Troponin I (High Sensitivity) 92 (H) <18 ng/L    Comment: (NOTE) Elevated high sensitivity troponin I (hsTnI) values and significant  changes across serial measurements may suggest ACS but many other  chronic and acute conditions are known to elevate hsTnI results.  Refer to the "Links" section for chest pain algorithms and additional  guidance. Performed at [facility], [address] [place], [place] [address]   MRSA PCR Screening     Status: None   Collection Time: 07/03/19 12:31 AM   Specimen: Nasal Mucosa; Nasopharyngeal  Result Value Ref Range   MRSA by PCR NEGATIVE NEGATIVE    Comment:        The GeneXpert MRSA Assay (FDA approved for NASAL specimens only), is one component of a comprehensive MRSA colonization surveillance program. It is not intended to diagnose MRSA infection nor to guide or monitor treatment for MRSA infections. Performed at [facility], [address] [place], [place] [address]   Glucose, capillary     Status: Abnormal   Collection Time: 07/03/19  1:07 AM  Result Value Ref Range   Glucose-Capillary 128 (H) 70 - 99 mg/dL  Glucose, capillary     Status: Abnormal   Collection Time: 07/03/19  5:06 AM  Result Value Ref Range   Glucose-Capillary 184 (H) 70 - 99 mg/dL  Hemoglobin A1c     Status: Abnormal   Collection Time: 07/03/19  5:08 AM  Result Value Ref Range   Hgb A1c MFr Bld 7.7 (H) 4.8 - 5.6 %  Comment:  (NOTE)         Prediabetes: 5.7 - 6.4         Diabetes: >6.4         Glycemic control for adults with diabetes: <7.0    Mean Plasma Glucose 174 mg/dL    Comment: (NOTE) Performed At: [facility] [place], [place] [id number] Rush Farmer MD [id number]   Urinalysis, Routine w reflex microscopic     Status: Abnormal   Collection Time: 07/03/19  6:00 AM  Result Value Ref Range   Color, Urine AMBER (A) YELLOW    Comment: BIOCHEMICALS MAY BE AFFECTED BY COLOR   APPearance CLOUDY (A) CLEAR   Specific Gravity, Urine 1.020 1.005 - 1.030   pH 5.0 5.0 - 8.0   Glucose, UA NEGATIVE NEGATIVE mg/dL   Hgb urine dipstick NEGATIVE NEGATIVE   Bilirubin Urine NEGATIVE NEGATIVE   Ketones, ur NEGATIVE NEGATIVE mg/dL   Protein, ur 100 (A) NEGATIVE mg/dL   Nitrite NEGATIVE NEGATIVE   Leukocytes,Ua LARGE (A) NEGATIVE   RBC / HPF 6-10 0 - 5 RBC/hpf   WBC, UA >50 (H) 0 - 5 WBC/hpf   Bacteria, UA MANY (A) NONE SEEN   Squamous Epithelial / LPF 11-20 0 - 5    Comment: Performed at [facility], [address], [place], [place] [address]  Urine Culture     Status: None   Collection Time: 07/03/19  6:00 AM   Specimen: Urine, Random  Result Value Ref Range   Specimen Description      URINE, RANDOM Performed at [facility], [address], [place], [place] [address]    Special Requests      NONE Performed at [facility], [address], [place], [place] [address]    Culture      Multiple bacterial morphotypes present, none predominant. Suggest appropriate recollection if clinically indicated.   Report Status 07/04/2019 FINAL   Troponin I (High Sensitivity)     Status: Abnormal   Collection Time: 07/03/19  6:35 AM  Result Value Ref Range   Troponin I (High Sensitivity) 100 (HH) <18 ng/L    Comment: CRITICAL RESULT CALLED TO, READ BACK BY AND VERIFIED WITH MARCELL TURNER@0720  ON 07/03/19 BY HKP (NOTE) Elevated high sensitivity troponin I  (hsTnI) values and significant  changes across serial measurements may suggest ACS but many other  chronic and acute conditions are known to elevate hsTnI results.  Refer to the "Links" section for chest pain algorithms and additional  guidance. Performed at [facility], [address] [place], [place] [address]   CBC with Differential/Platelet     Status: Abnormal   Collection Time: 07/03/19  6:35 AM  Result Value Ref Range   WBC 11.3 (H) 4.0 - 10.5 K/uL   RBC 4.01 3.87 - 5.11 MIL/uL   Hemoglobin 10.8 (L) 12.0 - 15.0 g/dL   HCT 35.7 (L) 36.0 - 46.0 %   MCV 89.0 80.0 - 100.0 fL   MCH 26.9 26.0 - 34.0 pg   MCHC 30.3 30.0 - 36.0 g/dL   RDW 14.5 11.5 - 15.5 %   Platelets 251 150 - 400 K/uL   nRBC 0.0 0.0 - 0.2 %   Neutrophils Relative % 60 %   Neutro Abs 6.6 1.7 - 7.7 K/uL   Lymphocytes Relative 27 %   Lymphs Abs 3.0 0.7 - 4.0 K/uL   Monocytes Relative 11 %   Monocytes Absolute 1.3 (H) 0.1 - 1.0  K/uL   Eosinophils Relative 2 %   Eosinophils Absolute 0.3 0.0 - 0.5 K/uL   Basophils Relative 0 %   Basophils Absolute 0.1 0.0 - 0.1 K/uL   Immature Granulocytes 0 %   Abs Immature Granulocytes 0.05 0.00 - 0.07 K/uL    Comment: Performed at [facility], [address] [place], [place] [address]  Comprehensive metabolic panel     Status: Abnormal   Collection Time: 07/03/19  6:35 AM  Result Value Ref Range   Sodium 133 (L) 135 - 145 mmol/L   Potassium 4.6 3.5 - 5.1 mmol/L   Chloride 96 (L) 98 - 111 mmol/L   CO2 25 22 - 32 mmol/L   Glucose, Bld 177 (H) 70 - 99 mg/dL   BUN 37 (H) 8 - 23 mg/dL   Creatinine, Ser 5.03 (H) 0.44 - 1.00 mg/dL   Calcium 9.1 8.9 - 10.3 mg/dL   Total Protein 6.9 6.5 - 8.1 g/dL   Albumin 3.5 3.5 - 5.0 g/dL   AST 17 15 - 41 U/L   ALT 11 0 - 44 U/L   Alkaline Phosphatase 80 38 - 126 U/L   Total Bilirubin 0.5 0.3 - 1.2 mg/dL   GFR calc non Af Amer 8 (L) >60 mL/min   GFR calc Af Amer 9 (L) >60 mL/min   Anion gap 12 5 - 15    Comment:  Performed at [facility], [address] [place], [place] [address]  Glucose, capillary     Status: Abnormal   Collection Time: 07/03/19  8:07 AM  Result Value Ref Range   Glucose-Capillary 150 (H) 70 - 99 mg/dL  Glucose, capillary     Status: Abnormal   Collection Time: 07/03/19 11:37 AM  Result Value Ref Range   Glucose-Capillary 234 (H) 70 - 99 mg/dL  Glucose, capillary     Status: Abnormal   Collection Time: 07/03/19  4:57 PM  Result Value Ref Range   Glucose-Capillary 174 (H) 70 - 99 mg/dL  Glucose, capillary     Status: Abnormal   Collection Time: 07/03/19  9:19 PM  Result Value Ref Range   Glucose-Capillary 223 (H) 70 - 99 mg/dL  Glucose, capillary     Status: Abnormal   Collection Time: 07/04/19  1:27 PM  Result Value Ref Range   Glucose-Capillary 145 (H) 70 - 99 mg/dL  Glucose, capillary     Status: Abnormal   Collection Time: 07/04/19  4:23 PM  Result Value Ref Range   Glucose-Capillary 223 (H) 70 - 99 mg/dL  SARS CORONAVIRUS 2 (TAT 6-24 HRS) Nasopharyngeal Nasopharyngeal Swab     Status: None   Collection Time: 07/04/19  5:10 PM   Specimen: Nasopharyngeal Swab  Result Value Ref Range   SARS Coronavirus 2 NEGATIVE NEGATIVE    Comment: (NOTE) SARS-CoV-2 target nucleic acids are NOT DETECTED. The SARS-CoV-2 RNA is generally detectable in upper and lower respiratory specimens during the acute phase of infection. Negative results do not preclude SARS-CoV-2 infection, do not rule out co-infections with other pathogens, and should not be used as the sole basis for treatment or other patient management decisions. Negative results must be combined with clinical observations, patient history, and epidemiological information. The expected result is Negative. Fact Sheet for Patients: SugarRoll.be Fact Sheet for Healthcare Providers: https://www.woods-mathews.com/ This test is not yet approved or cleared by the [place] FDA and  has been authorized for detection and/or diagnosis of SARS-CoV-2 by FDA under an Emergency  Use Authorization (EUA). This EUA will remain  in effect (meaning this test can be used) for the duration of the COVID-19 declaration under Section 56 4(b)(1) of the Act, 21 U.S.C. section 360bbb-3(b)(1), unless the authorization is terminated or revoked sooner. Performed at [facility], [place] [address], [place], [place] [address]   Glucose, capillary     Status: Abnormal   Collection Time: 07/04/19  9:09 PM  Result Value Ref Range   Glucose-Capillary 230 (H) 70 - 99 mg/dL  Glucose, capillary     Status: Abnormal   Collection Time: 07/05/19  5:40 AM  Result Value Ref Range   Glucose-Capillary 160 (H) 70 - 99 mg/dL  CBC     Status: Abnormal   Collection Time: 07/05/19  7:59 AM  Result Value Ref Range   WBC 7.2 4.0 - 10.5 K/uL   RBC 4.12 3.87 - 5.11 MIL/uL   Hemoglobin 11.2 (L) 12.0 - 15.0 g/dL   HCT 37.0 36.0 - 46.0 %   MCV 89.8 80.0 - 100.0 fL   MCH 27.2 26.0 - 34.0 pg   MCHC 30.3 30.0 - 36.0 g/dL   RDW 14.5 11.5 - 15.5 %   Platelets 219 150 - 400 K/uL   nRBC 0.0 0.0 - 0.2 %    Comment: Performed at [facility], [address] [place], [place] [address]  Glucose, capillary     Status: Abnormal   Collection Time: 07/05/19  8:00 AM  Result Value Ref Range   Glucose-Capillary 174 (H) 70 - 99 mg/dL  Glucose, capillary     Status: Abnormal   Collection Time: 07/05/19  1:05 PM  Result Value Ref Range   Glucose-Capillary 183 (H) 70 - 99 mg/dL  Glucose, capillary     Status: Abnormal   Collection Time: 07/05/19  4:58 PM  Result Value Ref Range   Glucose-Capillary 210 (H) 70 - 99 mg/dL   Comment 1 Notify RN    Comment 2 Document in Chart   Glucose, capillary     Status: Abnormal   Collection Time: 07/05/19  9:09 PM  Result Value Ref Range   Glucose-Capillary 241 (H) 70 - 99 mg/dL  Glucose, capillary     Status: Abnormal   Collection Time: 07/06/19   7:32 AM  Result Value Ref Range   Glucose-Capillary 174 (H) 70 - 99 mg/dL  Glucose, capillary     Status: Abnormal   Collection Time: 07/06/19  9:24 AM  Result Value Ref Range   Glucose-Capillary 194 (H) 70 - 99 mg/dL  Hepatitis B surface antigen     Status: None   Collection Time: 07/06/19 10:00 AM  Result Value Ref Range   Hepatitis B Surface Ag Negative Negative    Comment: (NOTE) Performed At: [facility] [place], [place] [id number] Rush Farmer MD [id number]   Renal function panel     Status: Abnormal   Collection Time: 07/06/19 10:00 AM  Result Value Ref Range   Sodium 131 (L) 135 - 145 mmol/L   Potassium 4.8 3.5 - 5.1 mmol/L   Chloride 93 (L) 98 - 111 mmol/L   CO2 29 22 - 32 mmol/L   Glucose, Bld 184 (H) 70 - 99 mg/dL   BUN 40 (H) 8 - 23 mg/dL   Creatinine, Ser 4.75 (H) 0.44 - 1.00 mg/dL   Calcium 9.4 8.9 - 10.3 mg/dL   Phosphorus 4.2 2.5 - 4.6 mg/dL   Albumin 3.4 (L) 3.5 - 5.0 g/dL  GFR calc non Af Amer 8 (L) >60 mL/min   GFR calc Af Amer 10 (L) >60 mL/min   Anion gap 9 5 - 15    Comment: Performed at [facility], [address] [place], [place] [address]  CBC     Status: Abnormal   Collection Time: 07/06/19 10:00 AM  Result Value Ref Range   WBC 6.4 4.0 - 10.5 K/uL   RBC 3.85 (L) 3.87 - 5.11 MIL/uL   Hemoglobin 10.5 (L) 12.0 - 15.0 g/dL   HCT 34.6 (L) 36.0 - 46.0 %   MCV 89.9 80.0 - 100.0 fL   MCH 27.3 26.0 - 34.0 pg   MCHC 30.3 30.0 - 36.0 g/dL   RDW 14.6 11.5 - 15.5 %   Platelets 233 150 - 400 K/uL   nRBC 0.0 0.0 - 0.2 %    Comment: Performed at [facility], [address] [place], [place] [address]  Glucose, capillary     Status: Abnormal   Collection Time: 07/06/19  1:37 PM  Result Value Ref Range   Glucose-Capillary 178 (H) 70 - 99 mg/dL  Glucose, capillary     Status: Abnormal   Collection Time: 07/06/19  4:16 PM  Result Value Ref Range   Glucose-Capillary 259 (H) 70 - 99 mg/dL     Assessment/Plan:  Hypertension blood pressure control important in reducing the progression of atherosclerotic disease. On appropriate oral medications.   Diabetes mellitus, type 2 ([place]) Likely an underlying cause of her ESRD and blood glucose control important in reducing the progression of atherosclerotic disease. Also, involved in wound healing. On appropriate medications.   ESRD on dialysis [facility])  Vein mapping today shows adequate cephalic vein at the antecubital fossa as well as at least marginal cephalic vein at the wrist on the left.  Her waveforms are triphasic but her radial and ulnar arteries are fairly small at the wrist..   Given these findings and the fact that her catheter really is not working well, I would lean towards performing a brachiocephalic AV fistula as it would mature faster and likely be usable for dialysis more readily.  I discussed the risks and benefits of the procedure.  I discussed using her left arm would be preferred with her previous right mastectomy and the fact this is her dominant arm.  She voices her understanding and is agreeable to proceed.      Leotis Pain 07/24/2019, 2:13 PM   This note was created with Dragon medical transcription system.  Any errors from dictation are unintentional.

## 2019-08-07 ENCOUNTER — Ambulatory Visit: Payer: Medicare Other | Admitting: Cardiology

## 2019-08-09 ENCOUNTER — Ambulatory Visit: Payer: Medicare Other | Admitting: Cardiology

## 2019-08-16 ENCOUNTER — Ambulatory Visit (INDEPENDENT_AMBULATORY_CARE_PROVIDER_SITE_OTHER): Payer: Medicare Other | Admitting: Cardiology

## 2019-08-16 ENCOUNTER — Encounter: Payer: Self-pay | Admitting: Cardiology

## 2019-08-16 ENCOUNTER — Other Ambulatory Visit: Payer: Self-pay

## 2019-08-16 VITALS — BP 160/80 | HR 52 | Ht 67.5 in | Wt 247.5 lb

## 2019-08-16 DIAGNOSIS — I10 Essential (primary) hypertension: Secondary | ICD-10-CM

## 2019-08-16 DIAGNOSIS — E7800 Pure hypercholesterolemia, unspecified: Secondary | ICD-10-CM

## 2019-08-16 DIAGNOSIS — Z01810 Encounter for preprocedural cardiovascular examination: Secondary | ICD-10-CM

## 2019-08-16 DIAGNOSIS — I5030 Unspecified diastolic (congestive) heart failure: Secondary | ICD-10-CM | POA: Diagnosis not present

## 2019-08-16 NOTE — Patient Instructions (Addendum)
Medication Instructions:  Your physician recommends that you continue on your current medications as directed. Please refer to the Current Medication list given to you today.  *If you need a refill on your cardiac medications before your next appointment, please call your pharmacy*  Lab Work: None ordered If you have labs (blood work) drawn today and your tests are completely normal, you will receive your results only by: Marland Kitchen MyChart Message (if you have MyChart) OR . A paper copy in the mail If you have any lab test that is abnormal or we need to change your treatment, we will call you to review the results.  Testing/Procedures: None ordered  Follow-Up: At [facility], you and your health needs are our priority.  As part of our continuing mission to provide you with exceptional heart care, we have created designated Provider Care Teams.  These Care Teams include your primary Cardiologist (physician) and Advanced Practice Providers (APPs -  Physician Assistants and Nurse Practitioners) who all work together to provide you with the care you need, when you need it.  Your next appointment:   As needed  The format for your next appointment:   In Person  Provider:    You may see Dr. Mylo Red- Charlestine Night or one of the following Advanced Practice Providers on your designated Care Team:    Murray Hodgkins, NP  Christell Faith, PA-C  Marrianne Mood, PA-C   Other Instructions N/A

## 2019-08-16 NOTE — Progress Notes (Signed)
Cardiology Office Note:    Date:  08/16/2019   ID:  Carolyn Valdez, DOB 04-02-1941, MRN [id number]  PCP:  System, Provider Not In  Cardiologist:  Kathlyn Sacramento, MD  Electrophysiologist:  None   Referring MD: No ref. provider found   Chief Complaint  Patient presents with  . New Patient (Initial Visit)    Surgical clearance; Meds verbally reviewed with patient    History of Present Illness:    Carolyn Valdez is a 78 y.o. 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  She has had problems with thrombosis of her dialysis catheter requiring changes.  Last change was about 2 months ago.  She is on chronic oxygen, she is not sure why.  She is a former smoker does not remember how long she smoked for.  She gets short of breath with activities such as walking with her walker.  She has knee arthritis and had a right knee replacement in the past.  Denies chest pain or any history of heart attacks.  She states having a long history of fatty legs.  Past Medical History:  Diagnosis Date  . (HFpEF) heart failure with preserved ejection fraction ([place])    a. 06/2018 Echo: EF 60-65%, no rwma. Gr2 DD. Mild LVH. Ao sclerosis w/o stenosis. Mild MR. Mild to mod LAE. Nl RV fxn. Mild RAE. Mod TR. PASP 60-68mmHg.  Marland Kitchen Anxiety   . Breast cancer ([place]) 1997   a. 1997 s/p right breast mastectomy  . Chest pain    a. 11/2008 MV: No ischemia. EF 66%.  . CKD (chronic kidney disease) stage 4, GFR 15-29 ml/min ([facility])   . Coronary artery calcification seen on CT scan    a. 01/2017 Chest CT: Atherosclerotic coronary Ca2+.  . Diabetes mellitus without complication ([place])   . DJD (degenerative joint disease)   . Dysuria   . GIB (gastrointestinal bleeding)    a. 07/2018 EGD: Gastritis w/ hemorrhage, duodenal angioectasia. Friable gastric mucosa  w/ bleeding on contact. All treated w/ argon plasma coagulation; b. 07/2018 s/p 2u PRBCs.  . H/O total knee replacement    right  . HTN (hypertension)   . Hypercholesteremia   . Kidney stone   . Obesity   . Recurrent urinary tract infection   . SVT (supraventricular tachycardia) ([place])   . Urinary incontinence   . Vaginal atrophy   . Yeast vaginitis     Past Surgical History:  Procedure Laterality Date  . CARDIAC CATHETERIZATION    . CHOLECYSTECTOMY    . COLONOSCOPY N/A 08/06/2018   Procedure: COLONOSCOPY;  Surgeon: Virgel Manifold, MD;  Location: [facility] ENDOSCOPY;  Service: Endoscopy;  Laterality: N/A;  . DIALYSIS/PERMA CATHETER INSERTION N/A 03/16/2019   Procedure: DIALYSIS/PERMA CATHETER INSERTION;  Surgeon: Algernon Huxley, MD;  Location: [place] [facility];  Service: Cardiovascular;  Laterality: N/A;  . DIALYSIS/PERMA CATHETER INSERTION N/A 05/29/2019   Procedure: DIALYSIS/PERMA CATHETER INSERTION;  Surgeon: Katha Cabal, MD;  Location: [place] [facility];  Service: Cardiovascular;  Laterality: N/A;  . ESOPHAGOGASTRODUODENOSCOPY N/A 08/06/2018   Procedure: ESOPHAGOGASTRODUODENOSCOPY (EGD);  Surgeon: Virgel Manifold, MD;  Location[facility] ENDOSCOPY;  Service: Endoscopy;  Laterality: N/A;  . FOOT SURGERY    . JOINT REPLACEMENT    . MASTECTOMY Right 1997  . NASAL SEPTUM SURGERY    . PERIPHERAL  VASCULAR CATHETERIZATION N/A 07/08/2015   Procedure: PICC Line Insertion;  Surgeon: Algernon Huxley, MD;  Location: [place] [facility];  Service: Cardiovascular;  Laterality: N/A;  . TEMPORARY DIALYSIS CATHETER N/A 03/09/2019   Procedure: TEMPORARY DIALYSIS CATHETER INSERTION;  Surgeon: Algernon Huxley, MD;  Location: [place] [facility];  Service: Cardiovascular;  Laterality: N/A;  . TONSILLECTOMY    . Vocal cords      Current Medications: Current Meds  Medication Sig  . acetaminophen (TYLENOL) 325 MG tablet Take 2 tablets (650 mg total) by mouth every 6 (six) hours as needed for  mild pain (or Fever >/= 101).  Marland Kitchen albuterol (PROVENTIL HFA;VENTOLIN HFA) 108 (90 Base) MCG/ACT inhaler Inhale 2 puffs into the lungs every 6 (six) hours as needed for wheezing or shortness of breath.  Marland Kitchen amoxicillin (AMOXIL) 500 MG capsule Take 2,000 mg by mouth as directed. Prior to dental procedure  . atorvastatin (LIPITOR) 40 MG tablet Take 1 tablet (40 mg total) by mouth daily at 6 PM. (Patient taking differently: Take 40 mg by mouth at bedtime. )  . calcium acetate (PHOSLO) 667 MG capsule Take 1,334 mg by mouth 3 (three) times daily before meals.  . camphor-menthol (MEN-PHOR) lotion Apply 1 application topically every 6 (six) hours as needed for itching.  . citalopram (CELEXA) 10 MG tablet Take 1 tablet (10 mg total) by mouth daily.  . clonazePAM (KLONOPIN) 0.25 MG disintegrating tablet Take 1 tablet (0.25 mg total) by mouth every 12 (twelve) hours as needed (anxiety).  Marland Kitchen docusate sodium (COLACE) 100 MG capsule Take 100 mg by mouth 2 (two) times daily.   . ferric citrate (AURYXIA) 1 GM 210 MG(Fe) tablet Take 420 mg by mouth 3 (three) times daily with meals.   . fluticasone (FLONASE) 50 MCG/ACT nasal spray Place 1 spray into both nostrils daily.   Marland Kitchen glipiZIDE (GLUCOTROL) 5 MG tablet Take 5 mg by mouth daily before breakfast.  . hydrALAZINE (APRESOLINE) 25 MG tablet Take 1 tablet (25 mg total) by mouth 3 (three) times daily with meals.  . insulin aspart (NOVOLOG) 100 UNIT/ML injection 3 (three) times daily before meals. Inject per sliding scale 0-199=0, 200-500=5 units, notify MD for blood sugar> or equal to 500  . insulin glargine (LANTUS) 100 UNIT/ML injection Inject 0.4 mLs (40 Units total) into the skin daily. (Patient taking differently: Inject 45 Units into the skin at bedtime. )  . iron polysaccharides (NIFEREX) 150 MG capsule Take 150 mg by mouth daily.  Marland Kitchen lamoTRIgine (LAMICTAL) 25 MG tablet Take 1 tablet (25 mg total) by mouth daily.  Marland Kitchen levETIRAcetam (KEPPRA) 250 MG tablet Take 1 tablet  (250 mg total) by mouth 2 (two) times daily.  Marland Kitchen loratadine (CLARITIN) 10 MG tablet Take 10 mg by mouth daily.  Marland Kitchen losartan (COZAAR) 50 MG tablet Take 50 mg by mouth daily.  . Melatonin 3 MG TABS Take 6 mg by mouth at bedtime.  . metoprolol tartrate (LOPRESSOR) 50 MG tablet Take 50 mg by mouth 2 (two) times daily.  . mirabegron ER (MYRBETRIQ) 25 MG TB24 tablet Take 25 mg by mouth daily.  . Multiple Vitamin (MULTIVITAMIN) tablet Take 1 tablet by mouth daily.  Marland Kitchen nystatin (NYSTATIN) powder Apply 1 g topically See admin instructions. Apply to under breast and abdominal once daily & apply every 12 hours as needed for abdominal folds, groin, and under breast  . OLANZapine (ZYPREXA) 5 MG tablet Take 5 mg by mouth at bedtime.  . ondansetron (ZOFRAN-ODT) 8  MG disintegrating tablet Take 8 mg by mouth every 8 (eight) hours as needed for nausea/vomiting.  Marland Kitchen oxyCODONE-acetaminophen (PERCOCET/ROXICET) 5-325 MG tablet Take 1 tablet by mouth every 6 (six) hours as needed for moderate pain or severe pain.  . pantoprazole (PROTONIX) 40 MG tablet Take 40 mg by mouth daily.   . polyethylene glycol (MIRALAX / GLYCOLAX) packet Take 17 g by mouth daily. (Patient taking differently: Take 17 g by mouth as needed. )  . saccharomyces boulardii (FLORASTOR) 250 MG capsule Take 250 mg by mouth daily.  . sennosides-docusate sodium (SENOKOT-S) 8.6-50 MG tablet Take 1 tablet by mouth at bedtime.  Marland Kitchen tiZANidine (ZANAFLEX) 2 MG tablet Take 2 mg by mouth every 8 (eight) hours as needed for muscle spasms.  Marland Kitchen torsemide (DEMADEX) 20 MG tablet Take 2 tablets (40 mg total) by mouth daily. (Patient taking differently: Take 20 mg by mouth daily. )  . vitamin B-12 (CYANOCOBALAMIN) 1000 MCG tablet Take 1,000 mcg by mouth daily.     Allergies:   Sulfa antibiotics, Biaxin [clarithromycin], Buspar [buspirone], Influenza a (h1n1) monoval vac, Morphine, Pyridium [phenazopyridine hcl], Tuberculin tests, Ceftriaxone, Latex, Prednisone, and Tape    Social History   Socioeconomic History  . Marital status: Widowed    Spouse name: Not on file  . Number of children: Not on file  . Years of education: Not on file  . Highest education level: Not on file  Occupational History  . Not on file  Social Needs  . Financial resource strain: Not on file  . Food insecurity    Worry: Not on file    Inability: Not on file  . Transportation needs    Medical: Not on file    Non-medical: Not on file  Tobacco Use  . Smoking status: Former Research scientist (life sciences)  . Smokeless tobacco: Former Systems developer    Quit date: 10/29/1995  Substance and Sexual Activity  . Alcohol use: No    Alcohol/week: 0.0 standard drinks  . Drug use: No  . Sexual activity: Never  Lifestyle  . Physical activity    Days per week: Not on file    Minutes per session: Not on file  . Stress: Not on file  Relationships  . Social Herbalist on phone: Not on file    Gets together: Not on file    Attends religious service: Not on file    Active member of club or organization: Not on file    Attends meetings of clubs or organizations: Not on file    Relationship status: Not on file  Other Topics Concern  . Not on file  Social History Narrative  . Not on file     Family History: The patient's family history includes Hematuria in her mother; Lung cancer in her father and mother. There is no history of Kidney disease, Bladder Cancer, or Breast cancer.  ROS:   Please see the history of present illness.     All other systems reviewed and are negative.  EKGs/Labs/Other Studies Reviewed:    The following studies were reviewed today: 2D Echo 03/02/2019: 1. The left ventricle has low normal systolic function, with an ejection fraction of 50-55%. The cavity size was mildly dilated. There is mild concentric left ventricular hypertrophy. Left ventricular diastolic Doppler parameters are consistent with pseudonormalization. 2. The right ventricle has normal systolic function. The cavity  was normal. There is no increase in right ventricular wall thickness. Right ventricular systolic pressure is severely elevated with an estimated  pressure of 72.7 mmHg. 3. Left atrial size was mildly dilated. 4. Right atrial size was mildly dilated. 5. The mitral valve is grossly normal. There is moderate mitral annular calcification present. Mild mitral valve stenosis. 6. The tricuspid valve is grossly normal. Tricuspid valve regurgitation is moderate. 7. The aortic valve is grossly normal. Aortic valve regurgitation was not assessed by color flow Doppler.  EKG:  EKG is  ordered today.  The ekg ordered today demonstrates sinus bradycardia heart rate 52, otherwise normal ECG.  Recent Labs: 03/15/2019: Magnesium 2.6 07/02/2019: B Natriuretic Peptide 97.0 07/03/2019: ALT 11 07/06/2019: BUN 40; Creatinine, Ser 4.75; Hemoglobin 10.5; Platelets 233; Potassium 4.8; Sodium 131  Recent Lipid Panel    Component Value Date/Time   CHOL 159 09/06/2015 0126   TRIG 152 (H) 09/06/2015 0126   HDL 32 (L) 09/06/2015 0126   CHOLHDL 5.0 09/06/2015 0126   VLDL 30 09/06/2015 0126   LDLCALC 97 09/06/2015 0126    Physical Exam:    VS:  BP (!) 160/80 (BP Location: Left Arm, Patient Position: Sitting, Cuff Size: Large)   Pulse (!) 52   Ht 5' 7.5" (1.715 m)   Wt 247 lb 8 oz (112.3 kg)   SpO2 99% Comment: on 3L O2  BMI 38.19 kg/m     Wt Readings from Last 3 Encounters:  08/16/19 247 lb 8 oz (112.3 kg)  07/06/19 270 lb 8.1 oz (122.7 kg)  03/19/19 251 lb 15.8 oz (114.3 kg)     GEN:  Well nourished, well developed in no acute distress, obese HEENT: Normal NECK: No JVD; No carotid bruits LYMPHATICS: No lymphadenopathy CARDIAC: Regular, bradycardic, 3/6 systolic murmur right upper sternal border. RESPIRATORY: Poor inspiratory effort, decreased breath sounds at bases. ABDOMEN: Soft, non-tender, non-distended MUSCULOSKELETAL: 2+ edema, nonpitting, probably lymphedema; No deformity  SKIN: Warm and dry  NEUROLOGIC:  Alert and oriented x 3 PSYCHIATRIC:  Normal affect   ASSESSMENT:   The Revised Cardiac Risk Index indicates that her Perioperative Risk of Major Cardiac Event is (%): 11.  Therefore, she is at high risk for perioperative complications.    Due to her comorbid conditions of obesity, pulmonary hypertension, diabetes on insulin, CKD, heart failure preserved ejection fraction, this places patient at high risk.  Her systolic function on last echocardiogram is normal.  Her cardiac murmur is secondary to aortic sclerosis, she has no stenosis.  With that said, she needs an AV fistula in order to chronically manage her renal dysfunction.  She is medically optimized from a cardiac perspective as possible.  As such, I think she should proceed for the procedure because this is lifesaving.  1. Pre-operative cardiovascular examination   2. Essential hypertension   3. Pure hypercholesterolemia   4. Heart failure with preserved ejection fraction, unspecified HF chronicity ([place])    PLAN:    Patient is elevated risk, as mentioned above.  She should proceed for the procedure because she is medically optimized.    hld  Continue Lipitor for her hyperlipidemia,   HTN -Blood pressure little elevated.  She is getting dialyzed tomorrow and I presume this will improve. continue current blood pressure medications for hypertension.  HFpEF -Last echocardiogram with grade 2 diastolic dysfunction. -Continue torsemide as prescribed.  Continue management of hypertension as above.  Total encounter time more than 45 minutes  Greater than 50% was spent in counseling and coordination of care with the patient    Medication Adjustments/Labs and Tests Ordered: Current medicines are reviewed at length with the  patient today.  Concerns regarding medicines are outlined above.  Orders Placed This Encounter  Procedures  . EKG 12-Lead   No orders of the defined types were placed in this encounter.    Patient Instructions  Medication Instructions:  Your physician recommends that you continue on your current medications as directed. Please refer to the Current Medication list given to you today.  *If you need a refill on your cardiac medications before your next appointment, please call your pharmacy*  Lab Work: None ordered If you have labs (blood work) drawn today and your tests are completely normal, you will receive your results only by: Marland Kitchen MyChart Message (if you have MyChart) OR . A paper copy in the mail If you have any lab test that is abnormal or we need to change your treatment, we will call you to review the results.  Testing/Procedures: None ordered  Follow-Up: At [facility], you and your health needs are our priority.  As part of our continuing mission to provide you with exceptional heart care, we have created designated Provider Care Teams.  These Care Teams include your primary Cardiologist (physician) and Advanced Practice Providers (APPs -  Physician Assistants and Nurse Practitioners) who all work together to provide you with the care you need, when you need it.  Your next appointment:   As needed  The format for your next appointment:   In Person  Provider:    You may see Dr. Mylo Red- Charlestine Night or one of the following Advanced Practice Providers on your designated Care Team:    Murray Hodgkins, NP  Christell Faith, PA-C  Marrianne Mood, PA-C   Other Instructions N/A     Signed, Kate Sable, MD  08/16/2019 3:55 PM    [place]

## 2019-08-29 ENCOUNTER — Telehealth (INDEPENDENT_AMBULATORY_CARE_PROVIDER_SITE_OTHER): Payer: Self-pay

## 2019-08-29 NOTE — Telephone Encounter (Signed)
I called [facility] regarding getting the patient scheduled for a surgery. A message was left for transportation to return my call to get the patient scheduled. The option was given for 09/19/2019 or 09/27/2019.

## 2019-09-10 ENCOUNTER — Emergency Department: Payer: Medicare Other

## 2019-09-10 ENCOUNTER — Other Ambulatory Visit: Payer: Self-pay

## 2019-09-10 ENCOUNTER — Inpatient Hospital Stay
Admission: EM | Admit: 2019-09-10 | Discharge: 2019-09-11 | DRG: 280 | Disposition: A | Discharge status: Other Acute Inpatient Hospital | Payer: Medicare Other | Source: Skilled Nursing Facility | Attending: Internal Medicine | Admitting: Internal Medicine

## 2019-09-10 DIAGNOSIS — J1289 Other viral pneumonia: Secondary | ICD-10-CM | POA: Diagnosis present

## 2019-09-10 DIAGNOSIS — I10 Essential (primary) hypertension: Secondary | ICD-10-CM | POA: Diagnosis present

## 2019-09-10 DIAGNOSIS — M1990 Unspecified osteoarthritis, unspecified site: Secondary | ICD-10-CM

## 2019-09-10 DIAGNOSIS — Z79890 Hormone replacement therapy (postmenopausal): Secondary | ICD-10-CM

## 2019-09-10 DIAGNOSIS — I132 Hypertensive heart and chronic kidney disease with heart failure and with stage 5 chronic kidney disease, or end stage renal disease: Secondary | ICD-10-CM | POA: Diagnosis present

## 2019-09-10 DIAGNOSIS — I959 Hypotension, unspecified: Secondary | ICD-10-CM | POA: Diagnosis not present

## 2019-09-10 DIAGNOSIS — Z87440 Personal history of urinary (tract) infections: Secondary | ICD-10-CM

## 2019-09-10 DIAGNOSIS — K219 Gastro-esophageal reflux disease without esophagitis: Secondary | ICD-10-CM | POA: Diagnosis present

## 2019-09-10 DIAGNOSIS — E1165 Type 2 diabetes mellitus with hyperglycemia: Secondary | ICD-10-CM | POA: Diagnosis present

## 2019-09-10 DIAGNOSIS — E785 Hyperlipidemia, unspecified: Secondary | ICD-10-CM | POA: Diagnosis present

## 2019-09-10 DIAGNOSIS — E7800 Pure hypercholesterolemia, unspecified: Secondary | ICD-10-CM | POA: Diagnosis present

## 2019-09-10 DIAGNOSIS — N186 End stage renal disease: Secondary | ICD-10-CM | POA: Diagnosis present

## 2019-09-10 DIAGNOSIS — Z66 Do not resuscitate: Secondary | ICD-10-CM | POA: Diagnosis present

## 2019-09-10 DIAGNOSIS — K580 Irritable bowel syndrome with diarrhea: Secondary | ICD-10-CM | POA: Diagnosis present

## 2019-09-10 DIAGNOSIS — Z87891 Personal history of nicotine dependence: Secondary | ICD-10-CM

## 2019-09-10 DIAGNOSIS — Z9011 Acquired absence of right breast and nipple: Secondary | ICD-10-CM | POA: Diagnosis not present

## 2019-09-10 DIAGNOSIS — Z87442 Personal history of urinary calculi: Secondary | ICD-10-CM

## 2019-09-10 DIAGNOSIS — I5032 Chronic diastolic (congestive) heart failure: Secondary | ICD-10-CM | POA: Diagnosis not present

## 2019-09-10 DIAGNOSIS — Z801 Family history of malignant neoplasm of trachea, bronchus and lung: Secondary | ICD-10-CM

## 2019-09-10 DIAGNOSIS — I214 Non-ST elevation (NSTEMI) myocardial infarction: Secondary | ICD-10-CM | POA: Diagnosis not present

## 2019-09-10 DIAGNOSIS — U071 COVID-19: Secondary | ICD-10-CM | POA: Diagnosis present

## 2019-09-10 DIAGNOSIS — F329 Major depressive disorder, single episode, unspecified: Secondary | ICD-10-CM | POA: Diagnosis present

## 2019-09-10 DIAGNOSIS — Z79891 Long term (current) use of opiate analgesic: Secondary | ICD-10-CM

## 2019-09-10 DIAGNOSIS — I5033 Acute on chronic diastolic (congestive) heart failure: Secondary | ICD-10-CM | POA: Diagnosis present

## 2019-09-10 DIAGNOSIS — Z992 Dependence on renal dialysis: Secondary | ICD-10-CM

## 2019-09-10 DIAGNOSIS — Z955 Presence of coronary angioplasty implant and graft: Secondary | ICD-10-CM | POA: Diagnosis not present

## 2019-09-10 DIAGNOSIS — Z96651 Presence of right artificial knee joint: Secondary | ICD-10-CM | POA: Diagnosis present

## 2019-09-10 DIAGNOSIS — I272 Pulmonary hypertension, unspecified: Secondary | ICD-10-CM | POA: Diagnosis present

## 2019-09-10 DIAGNOSIS — Z91040 Latex allergy status: Secondary | ICD-10-CM

## 2019-09-10 DIAGNOSIS — I483 Typical atrial flutter: Secondary | ICD-10-CM | POA: Diagnosis not present

## 2019-09-10 DIAGNOSIS — E1122 Type 2 diabetes mellitus with diabetic chronic kidney disease: Secondary | ICD-10-CM | POA: Diagnosis present

## 2019-09-10 DIAGNOSIS — F419 Anxiety disorder, unspecified: Secondary | ICD-10-CM | POA: Diagnosis present

## 2019-09-10 DIAGNOSIS — Z853 Personal history of malignant neoplasm of breast: Secondary | ICD-10-CM | POA: Diagnosis not present

## 2019-09-10 DIAGNOSIS — I248 Other forms of acute ischemic heart disease: Secondary | ICD-10-CM | POA: Diagnosis not present

## 2019-09-10 DIAGNOSIS — D631 Anemia in chronic kidney disease: Secondary | ICD-10-CM | POA: Diagnosis present

## 2019-09-10 DIAGNOSIS — R0600 Dyspnea, unspecified: Secondary | ICD-10-CM | POA: Diagnosis present

## 2019-09-10 DIAGNOSIS — Z9049 Acquired absence of other specified parts of digestive tract: Secondary | ICD-10-CM

## 2019-09-10 DIAGNOSIS — I441 Atrioventricular block, second degree: Secondary | ICD-10-CM | POA: Diagnosis present

## 2019-09-10 DIAGNOSIS — Z6841 Body Mass Index (BMI) 40.0 and over, adult: Secondary | ICD-10-CM | POA: Diagnosis not present

## 2019-09-10 DIAGNOSIS — R109 Unspecified abdominal pain: Secondary | ICD-10-CM

## 2019-09-10 DIAGNOSIS — R778 Other specified abnormalities of plasma proteins: Secondary | ICD-10-CM | POA: Diagnosis present

## 2019-09-10 DIAGNOSIS — R42 Dizziness and giddiness: Secondary | ICD-10-CM

## 2019-09-10 DIAGNOSIS — Z91048 Other nonmedicinal substance allergy status: Secondary | ICD-10-CM

## 2019-09-10 DIAGNOSIS — I890 Lymphedema, not elsewhere classified: Secondary | ICD-10-CM

## 2019-09-10 DIAGNOSIS — Z794 Long term (current) use of insulin: Secondary | ICD-10-CM

## 2019-09-10 DIAGNOSIS — Z882 Allergy status to sulfonamides status: Secondary | ICD-10-CM

## 2019-09-10 DIAGNOSIS — I4892 Unspecified atrial flutter: Secondary | ICD-10-CM | POA: Diagnosis present

## 2019-09-10 DIAGNOSIS — I2510 Atherosclerotic heart disease of native coronary artery without angina pectoris: Secondary | ICD-10-CM | POA: Diagnosis present

## 2019-09-10 DIAGNOSIS — Z79899 Other long term (current) drug therapy: Secondary | ICD-10-CM

## 2019-09-10 DIAGNOSIS — E119 Type 2 diabetes mellitus without complications: Secondary | ICD-10-CM

## 2019-09-10 DIAGNOSIS — I249 Acute ischemic heart disease, unspecified: Secondary | ICD-10-CM | POA: Diagnosis not present

## 2019-09-10 DIAGNOSIS — Z888 Allergy status to other drugs, medicaments and biological substances status: Secondary | ICD-10-CM

## 2019-09-10 DIAGNOSIS — I071 Rheumatic tricuspid insufficiency: Secondary | ICD-10-CM | POA: Diagnosis present

## 2019-09-10 DIAGNOSIS — I953 Hypotension of hemodialysis: Secondary | ICD-10-CM | POA: Diagnosis present

## 2019-09-10 LAB — CBC
HCT: 34.2 % — ABNORMAL LOW (ref 36.0–46.0)
Hemoglobin: 10.5 g/dL — ABNORMAL LOW (ref 12.0–15.0)
MCH: 27.7 pg (ref 26.0–34.0)
MCHC: 30.7 g/dL (ref 30.0–36.0)
MCV: 90.2 fL (ref 80.0–100.0)
Platelets: 462 10*3/uL — ABNORMAL HIGH (ref 150–400)
RBC: 3.79 MIL/uL — ABNORMAL LOW (ref 3.87–5.11)
RDW: 15.1 % (ref 11.5–15.5)
WBC: 12.6 10*3/uL — ABNORMAL HIGH (ref 4.0–10.5)
nRBC: 0 % (ref 0.0–0.2)

## 2019-09-10 LAB — BASIC METABOLIC PANEL
Anion gap: 15 (ref 5–15)
BUN: 26 mg/dL — ABNORMAL HIGH (ref 8–23)
CO2: 26 mmol/L (ref 22–32)
Calcium: 9 mg/dL (ref 8.9–10.3)
Chloride: 94 mmol/L — ABNORMAL LOW (ref 98–111)
Creatinine, Ser: 5.81 mg/dL — ABNORMAL HIGH (ref 0.44–1.00)
GFR calc Af Amer: 8 mL/min — ABNORMAL LOW (ref >60)
GFR calc non Af Amer: 7 mL/min — ABNORMAL LOW (ref >60)
Glucose, Bld: 281 mg/dL — ABNORMAL HIGH (ref 70–99)
Potassium: 3.7 mmol/L (ref 3.5–5.1)
Sodium: 135 mmol/L (ref 135–145)

## 2019-09-10 LAB — TROPONIN I (HIGH SENSITIVITY)
Troponin I (High Sensitivity): 2336 ng/L (ref <18)
Troponin I (High Sensitivity): 2021 ng/L (ref <18)

## 2019-09-10 LAB — HEPARIN LEVEL (UNFRACTIONATED): Heparin Unfractionated: 0.46 IU/mL (ref 0.30–0.70)

## 2019-09-10 LAB — SARS CORONAVIRUS 2 (TAT 6-24 HRS): SARS Coronavirus 2: POSITIVE — AB

## 2019-09-10 LAB — LIPID PANEL
Cholesterol: 129 mg/dL (ref 0–200)
HDL: 30 mg/dL — ABNORMAL LOW (ref >40)
LDL Cholesterol: 47 mg/dL (ref 0–99)
Total CHOL/HDL Ratio: 4.3 RATIO
Triglycerides: 261 mg/dL — ABNORMAL HIGH (ref <150)
VLDL: 52 mg/dL — ABNORMAL HIGH (ref 0–40)

## 2019-09-10 LAB — GLUCOSE, CAPILLARY: Glucose-Capillary: 228 mg/dL — ABNORMAL HIGH (ref 70–99)

## 2019-09-10 LAB — PROTIME-INR
INR: 1.1 (ref 0.8–1.2)
Prothrombin Time: 13.9 seconds (ref 11.4–15.2)

## 2019-09-10 LAB — TYPE AND SCREEN
ABO/RH(D): O POS
Antibody Screen: NEGATIVE

## 2019-09-10 LAB — BRAIN NATRIURETIC PEPTIDE: B Natriuretic Peptide: 869 pg/mL — ABNORMAL HIGH (ref 0.0–100.0)

## 2019-09-10 LAB — PHOSPHORUS: Phosphorus: 2.8 mg/dL (ref 2.5–4.6)

## 2019-09-10 LAB — APTT: aPTT: 35 seconds (ref 24–36)

## 2019-09-10 MED ORDER — PANTOPRAZOLE SODIUM 40 MG PO TBEC
40.0000 mg | DELAYED_RELEASE_TABLET | Freq: Every day | ORAL | Status: DC
  Administered 2019-09-10: 22:00:00 40 mg via ORAL
  Filled 2019-09-10 (×2): qty 1

## 2019-09-10 MED ORDER — ONDANSETRON HCL 4 MG/2ML IJ SOLN
4.0000 mg | Freq: Four times a day (QID) | INTRAMUSCULAR | Status: DC | PRN
  Administered 2019-09-10 – 2019-09-11 (×2): 4 mg via INTRAVENOUS
  Filled 2019-09-10 (×2): qty 2

## 2019-09-10 MED ORDER — ASPIRIN 81 MG PO CHEW: 81.0000 mg | CHEWABLE_TABLET | Freq: Every day | ORAL | Status: DC

## 2019-09-10 MED ORDER — ATORVASTATIN CALCIUM 20 MG PO TABS
40.0000 mg | ORAL_TABLET | Freq: Every day | ORAL | Status: DC
  Administered 2019-09-10: 40 mg via ORAL
  Filled 2019-09-10: qty 2

## 2019-09-10 MED ORDER — INSULIN ASPART 100 UNIT/ML ~~LOC~~ SOLN
0.0000 [IU] | Freq: Three times a day (TID) | SUBCUTANEOUS | Status: DC
  Administered 2019-09-11: 18:00:00 3 [IU] via SUBCUTANEOUS
  Administered 2019-09-11: 8 [IU] via SUBCUTANEOUS
  Filled 2019-09-10 (×2): qty 1

## 2019-09-10 MED ORDER — METOPROLOL TARTRATE 25 MG PO TABS
12.5000 mg | ORAL_TABLET | Freq: Four times a day (QID) | ORAL | Status: DC
  Administered 2019-09-11 (×2): 12.5 mg via ORAL
  Filled 2019-09-10 (×2): qty 1

## 2019-09-10 MED ORDER — DIGOXIN 0.25 MG/ML IJ SOLN
0.2500 mg | Freq: Once | INTRAMUSCULAR | Status: DC
  Filled 2019-09-10: qty 2

## 2019-09-10 MED ORDER — ACETAMINOPHEN 325 MG PO TABS
650.0000 mg | ORAL_TABLET | ORAL | Status: DC | PRN
  Administered 2019-09-11: 650 mg via ORAL
  Filled 2019-09-10: qty 2

## 2019-09-10 MED ORDER — METOPROLOL TARTRATE 5 MG/5ML IV SOLN
5.0000 mg | Freq: Once | INTRAVENOUS | Status: AC
  Administered 2019-09-10: 15:00:00 5 mg via INTRAVENOUS
  Filled 2019-09-10: qty 5

## 2019-09-10 MED ORDER — HEPARIN BOLUS VIA INFUSION
4000.0000 [IU] | Freq: Once | INTRAVENOUS | Status: DC
  Administered 2019-09-10: 4000 [IU] via INTRAVENOUS
  Filled 2019-09-10: qty 4000

## 2019-09-10 MED ORDER — HEPARIN (PORCINE) 25000 UT/250ML-% IV SOLN
1350.0000 [IU]/h | INTRAVENOUS | Status: DC
  Administered 2019-09-10: 1100 [IU]/h via INTRAVENOUS
  Filled 2019-09-10: qty 250

## 2019-09-10 MED ORDER — METOPROLOL TARTRATE 25 MG PO TABS: 12.5000 mg | ORAL_TABLET | Freq: Two times a day (BID) | ORAL | Status: DC

## 2019-09-10 NOTE — H&P (Addendum)
History and Physical    Carolyn Valdez:[id number] DOB: [birth year] DOA: 09/10/2019  PCP: SNF provider Patient coming from: SNF (COVID dedicated unit)  I have personally briefly reviewed patient's old medical records in [place]  Chief Complaint: Dyspnea, dizziness  HPI: Carolyn Valdez is a 78 y.o. female with medical history significant of ESRD on dialysis, chronic diastolic CHF, pulmonary hypertension, type 2 diabetes, GI bleed in Oct 2019, HTN, HLD, DJD, HFpEF, breast cancer, chronic lymphedema, obesity  anxiety/depression.  Chronically on 3L/min O2.  She presented to the ED today from dialysis with dyspnea and dizziness.  She was only able to have 45 minutes of dialysis and EMS was called.  Patient reports having SOB and dizziness for past several days, states gasping for air at night as well, also past few days.  She usually sleeps with head of bed elevated or in her recliner at home, does not lay flat.  Had a single brief episode of chest pain that radiated down her left arm a couple of nights ago, resolved spontaneously, has not recurred.  Also reports intermittent nausea/vomiting, chronically has spells of nausea, but lately somewhat worse.  Reports diarrhea, has history of IBS but usually more constipated.  Denies fever/chills, cough.  Endorses poor appetite, headache that feels like tension.    ED Course: BP 87/47, HR 119, 100% on baseline 3 L/min O2.  Notable labs h/s troponin 2336 -> 2021, BNP 869, WBC 12.6k.  CXR showed stable cardiomegaly with mild vascular congestion, no focal consolidation or pneumothorax.  ECG showed atrial flutter 2-1 AV condution, nonspecific ST and T wave changes.    Patient admitted, cardiology consulted.  Started on heparin drip, metoprolol for rate control as BP tolerates.  Plan is for right and left heart cath tomorrow.  During admission encounter, patient was sinus tach on monitor with HR 112.  RE: Covid Status Patient tested COVID-19 positive at her  [name]) on 11/4 along with several other residents, per patient.  Has since been at a rehab SNF dedicated Covid-19 unit.  She never had significant symptoms of Covid-19.  Her Covid test on admission also positive.  Less than 28 days from initial positive, therefore will keep on airborne and droplet precautions at this time.   Review of Systems: As per HPI otherwise 10 point review of systems negative.    Past Medical History:  Diagnosis Date   (HFpEF) heart failure with preserved ejection fraction ([place])    a. 06/2018 Echo: EF 60-65%, no rwma. Gr2 DD. Mild LVH. Ao sclerosis w/o stenosis. Mild MR. Mild to mod LAE. Nl RV fxn. Mild RAE. Mod TR. PASP 60-62mmHg.   Anxiety    Breast cancer ([place]) 1997   a. 1997 s/p right breast mastectomy   Chest pain    a. 11/2008 MV: No ischemia. EF 66%.   CKD (chronic kidney disease) stage 4, GFR 15-29 ml/min (HCC)    Coronary artery calcification seen on CT scan    a. 01/2017 Chest CT: Atherosclerotic coronary Ca2+.   Diabetes mellitus without complication (HCC)    DJD (degenerative joint disease)    Dysuria    GIB (gastrointestinal bleeding)    a. 07/2018 EGD: Gastritis w/ hemorrhage, duodenal angioectasia. Friable gastric mucosa w/ bleeding on contact. All treated w/ argon plasma coagulation; b. 07/2018 s/p 2u PRBCs.   H/O total knee replacement    right   HTN (hypertension)    Hypercholesteremia    Kidney stone    Obesity  Recurrent urinary tract infection    SVT (supraventricular tachycardia) (HCC)    Urinary incontinence    Vaginal atrophy    Yeast vaginitis     Past Surgical History:  Procedure Laterality Date   CARDIAC CATHETERIZATION     CHOLECYSTECTOMY     COLONOSCOPY N/A 08/06/2018   Procedure: COLONOSCOPY;  Surgeon: Virgel Manifold, MD;  Location: [facility] ENDOSCOPY;  Service: Endoscopy;  Laterality: N/A;   DIALYSIS/PERMA CATHETER INSERTION N/A 03/16/2019   Procedure: DIALYSIS/PERMA CATHETER INSERTION;   Surgeon: Algernon Huxley, MD;  Location: [place] [facility];  Service: Cardiovascular;  Laterality: N/A;   DIALYSIS/PERMA CATHETER INSERTION N/A 05/29/2019   Procedure: DIALYSIS/PERMA CATHETER INSERTION;  Surgeon: Katha Cabal, MD;  Location: [place] [facility];  Service: Cardiovascular;  Laterality: N/A;   ESOPHAGOGASTRODUODENOSCOPY N/A 08/06/2018   Procedure: ESOPHAGOGASTRODUODENOSCOPY (EGD);  Surgeon: Virgel Manifold, MD;  Location: [facility] ENDOSCOPY;  Service: Endoscopy;  Laterality: N/A;   FOOT SURGERY     JOINT REPLACEMENT     MASTECTOMY Right 1997   NASAL SEPTUM SURGERY     PERIPHERAL VASCULAR CATHETERIZATION N/A 07/08/2015   Procedure: PICC Line Insertion;  Surgeon: Algernon Huxley, MD;  Location: [place] [facility];  Service: Cardiovascular;  Laterality: N/A;   TEMPORARY DIALYSIS CATHETER N/A 03/09/2019   Procedure: TEMPORARY DIALYSIS CATHETER INSERTION;  Surgeon: Algernon Huxley, MD;  Location: [place] [facility];  Service: Cardiovascular;  Laterality: N/A;   TONSILLECTOMY     Vocal cords       reports that she has quit smoking. She quit smokeless tobacco use about 23 years ago. She reports that she does not drink alcohol or use drugs.  Allergies  Allergen Reactions   Sulfa Antibiotics Hives   Biaxin [Clarithromycin] Hives   Buspar [Buspirone]    Influenza A (H1n1) Monoval Vac Other (See Comments)    Pt states that she was told by her MD not to get the influenza vaccine.     Morphine Other (See Comments)    Reaction:  Dizziness and confusion    Pyridium [Phenazopyridine Hcl] Other (See Comments)    Reaction:  Unknown    Tuberculin Tests    Ceftriaxone Anxiety   Latex Rash   Prednisone Rash   Tape Rash    Family History  Problem Relation Age of Onset   Lung cancer Father    Hematuria Mother    Lung cancer Mother    Kidney disease Neg Hx    Bladder Cancer Neg Hx    Breast cancer Neg Hx     Prior to Admission medications     Medication Sig Start Date End Date Taking? Authorizing Provider  acetaminophen (TYLENOL) 325 MG tablet Take 2 tablets (650 mg total) by mouth every 6 (six) hours as needed for mild pain (or Fever >/= 101). 04/26/18   Loletha Grayer, MD  albuterol (PROVENTIL HFA;VENTOLIN HFA) 108 (90 Base) MCG/ACT inhaler Inhale 2 puffs into the lungs every 6 (six) hours as needed for wheezing or shortness of breath. 07/14/18   Gouru, Illene Silver, MD  amoxicillin (AMOXIL) 500 MG capsule Take 2,000 mg by mouth as directed. Prior to dental procedure    [provider]  atorvastatin (LIPITOR) 40 MG tablet Take 1 tablet (40 mg total) by mouth daily at 6 PM. Patient taking differently: Take 40 mg by mouth at bedtime.  03/19/19   Dustin Flock, MD  calcium acetate (PHOSLO) 667 MG capsule Take 1,334 mg by mouth 3 (three) times  daily before meals.    [provider]  camphor-menthol (MEN-PHOR) lotion Apply 1 application topically every 6 (six) hours as needed for itching.    [provider]  citalopram (CELEXA) 10 MG tablet Take 1 tablet (10 mg total) by mouth daily. 02/04/17   Loletha Grayer, MD  clonazePAM (KLONOPIN) 0.25 MG disintegrating tablet Take 1 tablet (0.25 mg total) by mouth every 12 (twelve) hours as needed (anxiety). 03/19/19   Dustin Flock, MD  docusate sodium (COLACE) 100 MG capsule Take 100 mg by mouth 2 (two) times daily.     [provider]  ferric citrate (AURYXIA) 1 GM 210 MG(Fe) tablet Take 420 mg by mouth 3 (three) times daily with meals.     [provider]  fluticasone (FLONASE) 50 MCG/ACT nasal spray Place 1 spray into both nostrils daily.     [provider]  glipiZIDE (GLUCOTROL) 5 MG tablet Take 5 mg by mouth daily before breakfast.    [provider]  hydrALAZINE (APRESOLINE) 25 MG tablet Take 1 tablet (25 mg total) by mouth 3 (three) times daily with meals. 03/19/19   Dustin Flock, MD  insulin aspart (NOVOLOG) 100 UNIT/ML injection 3  (three) times daily before meals. Inject per sliding scale 0-199=0, 200-500=5 units, notify MD for blood sugar> or equal to 500    [provider]  insulin glargine (LANTUS) 100 UNIT/ML injection Inject 0.4 mLs (40 Units total) into the skin daily. Patient taking differently: Inject 45 Units into the skin at bedtime.  03/19/19   Dustin Flock, MD  iron polysaccharides (NIFEREX) 150 MG capsule Take 150 mg by mouth daily.    [provider]  lamoTRIgine (LAMICTAL) 25 MG tablet Take 1 tablet (25 mg total) by mouth daily. 04/26/18   Loletha Grayer, MD  levETIRAcetam (KEPPRA) 250 MG tablet Take 1 tablet (250 mg total) by mouth 2 (two) times daily. 10/26/16   Epifanio Lesches, MD  loratadine (CLARITIN) 10 MG tablet Take 10 mg by mouth daily.    [provider]  losartan (COZAAR) 50 MG tablet Take 50 mg by mouth daily.    [provider]  Melatonin 3 MG TABS Take 6 mg by mouth at bedtime.    [provider]  Meth-Hyo-M Bl-Na Phos-Ph Sal (URIBEL) 118 MG CAPS Take 118 mg by mouth every 6 (six) hours as needed (uti symptoms).    [provider]  metoprolol tartrate (LOPRESSOR) 50 MG tablet Take 50 mg by mouth 2 (two) times daily.    [provider]  mirabegron ER (MYRBETRIQ) 25 MG TB24 tablet Take 25 mg by mouth daily.    [provider]  Multiple Vitamin (MULTIVITAMIN) tablet Take 1 tablet by mouth daily.    [provider]  nystatin (NYSTATIN) powder Apply 1 g topically See admin instructions. Apply to under breast and abdominal once daily & apply every 12 hours as needed for abdominal folds, groin, and under breast    [provider]  OLANZapine (ZYPREXA) 5 MG tablet Take 5 mg by mouth at bedtime.    [provider]  ondansetron (ZOFRAN-ODT) 8 MG disintegrating tablet Take 8 mg by mouth every 8 (eight) hours as needed for nausea/vomiting. 08/02/18   [provider]  oxyCODONE-acetaminophen  (PERCOCET/ROXICET) 5-325 MG tablet Take 1 tablet by mouth every 6 (six) hours as needed for moderate pain or severe pain. 03/19/19   Dustin Flock, MD  pantoprazole (PROTONIX) 40 MG tablet Take 40 mg by mouth daily.  [provider]  polyethylene glycol (MIRALAX / GLYCOLAX) packet Take 17 g by mouth daily. Patient taking differently: Take 17 g by mouth as needed.  04/26/18   Loletha Grayer, MD  saccharomyces boulardii (FLORASTOR) 250 MG capsule Take 250 mg by mouth daily.    [provider]  sennosides-docusate sodium (SENOKOT-S) 8.6-50 MG tablet Take 1 tablet by mouth at bedtime.    [provider]  tiZANidine (ZANAFLEX) 2 MG tablet Take 2 mg by mouth every 8 (eight) hours as needed for muscle spasms.    [provider]  torsemide (DEMADEX) 20 MG tablet Take 2 tablets (40 mg total) by mouth daily. Patient taking differently: Take 20 mg by mouth daily.  07/15/18   Nicholes Mango, MD  vitamin B-12 (CYANOCOBALAMIN) 1000 MCG tablet Take 1,000 mcg by mouth daily.    [provider]    Physical Exam: Vitals:   09/10/19 1742 09/10/19 1824 09/10/19 1830 09/10/19 1931  BP: 90/61 (!) 97/52 (!) 102/51 115/63  Pulse:  (!) 118 (!) 120 (!) 120  Resp:  13 15 20   SpO2:  97% 100% 100%  Weight:      Height:        Vitals:   09/10/19 1742 09/10/19 1824 09/10/19 1830 09/10/19 1931  BP: 90/61 (!) 97/52 (!) 102/51 115/63  Pulse:  (!) 118 (!) 120 (!) 120  Resp:  13 15 20   SpO2:  97% 100% 100%  Weight:      Height:       Constitutional: NAD, calm, comfortable, morbidly obese Eyes: EOMI, lids and conjunctivae normal ENMT: Mucous membranes are moist. Hearing grossly normal.  Respiratory: clear to auscultation bilaterally, no wheezing, no crackles. Normal respiratory effort. No accessory muscle use.  Cardiovascular: Tachycardic, regular rhythm, no appreciable murmurs / rubs / gallops. Unable to visualize JVD. Bilateral lower extremity non-pitting edema  R>L. Abdomen: soft, obese, non-distended, non-tender, owel sounds positive.  Musculoskeletal: no clubbing / cyanosis. Grossly normal inspection. Skin: pale, normal temperature, dry Neurologic: CN 2-12 grossly intact. Normal speech. Psychiatric: Normal judgment and insight. Alert and oriented x 3. Normal mood.    Labs on Admission: I have personally reviewed following labs and imaging studies  CBC: Recent Labs  Lab 09/10/19 1329  WBC 12.6*  HGB 10.5*  HCT 34.2*  MCV 90.2  PLT 341*   Basic Metabolic Panel: Recent Labs  Lab 09/10/19 1329  NA 135  K 3.7  CL 94*  CO2 26  GLUCOSE 281*  BUN 26*  CREATININE 5.81*  CALCIUM 9.0  PHOS 2.8   GFR: Estimated Creatinine Clearance: 10.9 mL/min (A) (by C-G formula based on SCr of 5.81 mg/dL (H)). Liver Function Tests: No results for input(s): AST, ALT, ALKPHOS, BILITOT, PROT, ALBUMIN in the last 168 hours. No results for input(s): LIPASE, AMYLASE in the last 168 hours. No results for input(s): AMMONIA in the last 168 hours. Coagulation Profile: Recent Labs  Lab 09/10/19 1515  INR 1.1   Cardiac Enzymes: No results for input(s): CKTOTAL, CKMB, CKMBINDEX, TROPONINI in the last 168 hours. BNP (last 3 results) No results for input(s): PROBNP in the last 8760 hours. HbA1C: No results for input(s): HGBA1C in the last 72 hours. CBG: No results for input(s): GLUCAP in the last 168 hours. Lipid Profile: Recent Labs    09/10/19 1329  CHOL 129  HDL 30*  LDLCALC 47  TRIG 261*  CHOLHDL 4.3   Thyroid Function Tests: No results for input(s): TSH, T4TOTAL, FREET4, T3FREE, THYROIDAB in  the last 72 hours. Anemia Panel: No results for input(s): VITAMINB12, FOLATE, FERRITIN, TIBC, IRON, RETICCTPCT in the last 72 hours. Urine analysis:    Component Value Date/Time   COLORURINE AMBER (A) 07/03/2019 0600   APPEARANCEUR CLOUDY (A) 07/03/2019 0600   APPEARANCEUR Cloudy (A) 01/16/2018 1349   LABSPEC 1.020 07/03/2019 0600   LABSPEC  1.008 09/08/2014 1719   PHURINE 5.0 07/03/2019 0600   GLUCOSEU NEGATIVE 07/03/2019 0600   GLUCOSEU Negative 09/08/2014 1719   HGBUR NEGATIVE 07/03/2019 0600   BILIRUBINUR NEGATIVE 07/03/2019 0600   BILIRUBINUR Negative 01/16/2018 1349   BILIRUBINUR Negative 09/08/2014 1719   KETONESUR NEGATIVE 07/03/2019 0600   PROTEINUR 100 (A) 07/03/2019 0600   NITRITE NEGATIVE 07/03/2019 0600   LEUKOCYTESUR LARGE (A) 07/03/2019 0600   LEUKOCYTESUR 3+ 09/08/2014 1719    Radiological Exams on Admission: Dg Chest Port 1 View  Result Date: 09/10/2019 CLINICAL DATA:  78 year old female with shortness of breath. EXAM: PORTABLE CHEST 1 VIEW COMPARISON:  Chest radiograph dated 07/02/2019. FINDINGS: Right-sided dialysis catheter in similar position. There is stable cardiomegaly with mild vascular congestion. No edema. No focal consolidation or pneumothorax. Right apical subpleural crescentic density may represent scarring or trace loculated effusion. There is atherosclerotic calcification of the aortic arch. No acute osseous pathology. IMPRESSION: 1. Stable cardiomegaly with mild vascular congestion. No focal consolidation or pneumothorax. 2. Right apical subpleural crescentic density may represent trace pleural effusion. Electronically Signed   By: Anner Crete M.D.   On: 09/10/2019 13:48    EKG: Independently reviewed.   Assessment/Plan Principal Problem:   NSTEMI (non-ST elevated myocardial infarction) ([place]) Active Problems:   Chronic diastolic CHF (congestive heart failure) (HCC)   Dizziness   Atrial flutter, paroxysmal (HCC)   Elevated troponin   Dyspnea   Hypotension   Diabetes mellitus, type 2 (HCC)   Hypertension   ESRD on dialysis ([place])   Pulmonary hypertension (HCC)   Lymphedema   Anemia associated with chronic renal failure   GERD (gastroesophageal reflux disease)   Degenerative joint disease   NSTEMI - with rapid a-flutter and hypotension, could be true ischemia vs demand  ischemia in setting of a-flutter.  Very elevated troponin in setting of ESRD, trending down.  PE in differential for this presentation, but she's on baseline oxygen.     New onset Atrial Flutter - converted to sinus tach in ED. Hypotension - secondary to above two Pulmonary Hypertension Chronic Diastolic Heart Failure - clinically compensated, although BNP elevated this can be due to ESRD.  On baseline oxygen. - cardiology following - planning for right/left heart cath tomorrow if covid test negative - heparin gtt - for NSTEMI and a-flutter - metoprolol IV PRN for rate control - transition to Eliquis or warfarin after cath - possible TEE cardioversion in future - echo - low threshold for CTA chest to eval for PE if hypoxia worsens or tachycardia and hypotension persist or worsen.  On heparin. - ASA daily - hold home diuretic and antihypertensives due to low BP, resume when BP tolerates - supplemental O2, maintain sat >= 90%  Type 2 Diabetes - hold oral meds - continue home Lantus - sliding scale coverage, moderate for now  Continue other home meds for chronic conditions per orders.  DVT prophylaxis: on heparin gtt  Code Status: DNR Family Communication: none at bedside Disposition Plan: return to SNF, pending improvement and cardiology sign-off Consults called: cardiology, Dr. Fletcher Anon Admission status: inpatient  Patient will require more than two midnight stay for evaluation  and management of above problems as outlined.    Ezekiel Slocumb, DO [facility] Pager [phone]  If 7PM-7AM, please contact night-coverage [email or web address] Password [facility]  09/10/2019, 8:15 PM

## 2019-09-10 NOTE — Progress Notes (Signed)
Notified by nursing at around 2047 that patient was having worsen palpitation and "anxiety" feelings. ED Physician looked over repeat EKG and was concerned about worsening ST depression in V4-6.  Pt admitted earlier this morning by Dr. Arbutus Ped for NSTEMI and atrial flutter. Hx of ESRD. Currently still in atrial flutter with rates anywhere from 90s to 120. Blood pressure also soft around 14Y systolic at times.  Discussed case with cardiologist Dr. Dorris Carnes. She recommends low dose IV 0.25 digoxin once and Lopressor 12.5mg  PO q6hr  I have placed order in Epic and have notify nurse to give IV digoxin at this time but will need to hold Lopressor as her current BP is 93/58. Would recommend resuming Lopressor if her systolic is 431 or higher.   If these measures do not control her heart rate and symptoms, she recommends possible IV Amiodarone with no bolus at 1mg /min.

## 2019-09-10 NOTE — ED Notes (Addendum)
Physician at bedside.

## 2019-09-10 NOTE — ED Notes (Signed)
Pt's dialysis will be rescheduled for tomorrow once covid results are back.

## 2019-09-10 NOTE — Consult Note (Signed)
ANTICOAGULATION CONSULT NOTE - Initial Consult  Pharmacy Consult for Heparin Infusion Indication: chest pain/ACS  Allergies  Allergen Reactions  . Sulfa Antibiotics Hives  . Biaxin [Clarithromycin] Hives  . Buspar [Buspirone]   . Influenza A (H1n1) Monoval Vac Other (See Comments)    Pt states that she was told by her MD not to get the influenza vaccine.    . Morphine Other (See Comments)    Reaction:  Dizziness and confusion   . Pyridium [Phenazopyridine Hcl] Other (See Comments)    Reaction:  Unknown   . Tuberculin Tests   . Ceftriaxone Anxiety  . Latex Rash  . Prednisone Rash  . Tape Rash    Patient Measurements: Height: 5\' 7"  (170.2 cm) Weight: 260 lb (117.9 kg) IBW/kg (Calculated) : 61.6 Heparin Dosing Weight: 89.3 kg  Vital Signs: BP: 116/69 (11/23 1430) Pulse Rate: 117 (11/23 1430)  Labs: Recent Labs    09/10/19 1329  HGB 10.5*  HCT 34.2*  PLT 462*  CREATININE 5.81*  TROPONINIHS 2,336*    Estimated Creatinine Clearance: 10.9 mL/min (A) (by C-G formula based on SCr of 5.81 mg/dL (H)).   Medical History: Past Medical History:  Diagnosis Date  . (HFpEF) heart failure with preserved ejection fraction ([employer])    a. 06/2018 Echo: EF 60-65%, no rwma. Gr2 DD. Mild LVH. Ao sclerosis w/o stenosis. Mild MR. Mild to mod LAE. Nl RV fxn. Mild RAE. Mod TR. PASP 60-81mmHg.  Marland Kitchen Anxiety   . Breast cancer ([place]) 1997   a. 1997 s/p right breast mastectomy  . Chest pain    a. 11/2008 MV: No ischemia. EF 66%.  . CKD (chronic kidney disease) stage 4, GFR 15-29 ml/min (HCC)   . Coronary artery calcification seen on CT scan    a. 01/2017 Chest CT: Atherosclerotic coronary Ca2+.  . Diabetes mellitus without complication ([employer])   . DJD (degenerative joint disease)   . Dysuria   . GIB (gastrointestinal bleeding)    a. 07/2018 EGD: Gastritis w/ hemorrhage, duodenal angioectasia. Friable gastric mucosa w/ bleeding on contact. All treated w/ argon plasma coagulation; b. 07/2018 s/p 2u  PRBCs.  . H/O total knee replacement    right  . HTN (hypertension)   . Hypercholesteremia   . Kidney stone   . Obesity   . Recurrent urinary tract infection   . SVT (supraventricular tachycardia) ([place])   . Urinary incontinence   . Vaginal atrophy   . Yeast vaginitis     Medications:  (Not in a hospital admission)  Scheduled:  . heparin  4,000 Units Intravenous Once   Infusions:  . heparin     PRN:  Anti-infectives (From admission, onward)   None      Assessment: Pharmacy has consulted to initiate Heparin Infusion in 78yo patient admitted with increased SOB and dizziness. Initial troponin level was 2336 ng/L and BNP of 869 pg/mL. Patient has no history of PTA anticoagulation use. Platelets are currently elevated(462 k/uL). Baseline labs have been ordered and are pending. Will initiate therapy immediately.  Goal of Therapy:  Heparin level 0.3-0.7 units/ml Monitor platelets by anticoagulation protocol: Yes   Plan:  Give 4000 units bolus x 1 Start heparin infusion at 1100 units/hr Check anti-Xa level in 8 hours and daily while on heparin Continue to monitor H&H and platelets  Elleah Hemsley A Dhanya Bogle 09/10/2019,3:04 PM

## 2019-09-10 NOTE — ED Notes (Signed)
Pt checked and dry at this time.Marland Kitchen pt pulled up in bed and repostioned

## 2019-09-10 NOTE — ED Notes (Signed)
Pt checked and dry at this time. 

## 2019-09-10 NOTE — ED Notes (Signed)
Called lab and spoke with Levada Dy and they will add on phosphorus to blood already in lab

## 2019-09-10 NOTE — ED Notes (Addendum)
Pt states she tested positive for covid 11/4.

## 2019-09-10 NOTE — ED Provider Notes (Signed)
[facility] Emergency Department Provider Note       Time seen: ----------------------------------------- 1:29 PM on 09/10/2019 -----------------------------------------   I have reviewed the triage vital signs and the nursing notes.  HISTORY   Chief Complaint Shortness of Breath and Dizziness    HPI Carolyn Valdez is a 78 y.o. female with a history of heart failure, breast cancer, CKD, degenerative joint disease, hypertension, hyperlipidemia, end-stage renal disease on dialysis who presents to the ED for dizziness and increased shortness of breath.  Patient only received about 45 minutes of dialysis and could not complete it.  She is on chronically 3 L of nasal cannula oxygen.  She was also hypotensive with EKG changes according to EMS.  Past Medical History:  Diagnosis Date  . (HFpEF) heart failure with preserved ejection fraction ([place])    a. 06/2018 Echo: EF 60-65%, no rwma. Gr2 DD. Mild LVH. Ao sclerosis w/o stenosis. Mild MR. Mild to mod LAE. Nl RV fxn. Mild RAE. Mod TR. PASP 60-35mmHg.  Marland Kitchen Anxiety   . Breast cancer ([place]) 1997   a. 1997 s/p right breast mastectomy  . Chest pain    a. 11/2008 MV: No ischemia. EF 66%.  . CKD (chronic kidney disease) stage 4, GFR 15-29 ml/min (HCC)   . Coronary artery calcification seen on CT scan    a. 01/2017 Chest CT: Atherosclerotic coronary Ca2+.  . Diabetes mellitus without complication ([place])   . DJD (degenerative joint disease)   . Dysuria   . GIB (gastrointestinal bleeding)    a. 07/2018 EGD: Gastritis w/ hemorrhage, duodenal angioectasia. Friable gastric mucosa w/ bleeding on contact. All treated w/ argon plasma coagulation; b. 07/2018 s/p 2u PRBCs.  . H/O total knee replacement    right  . HTN (hypertension)   . Hypercholesteremia   . Kidney stone   . Obesity   . Recurrent urinary tract infection   . SVT (supraventricular tachycardia) ([place])   . Urinary incontinence   . Vaginal atrophy   . Yeast vaginitis      Patient Active Problem List   Diagnosis Date Noted  . ESRD on dialysis ([place]) 07/24/2019  . Vertigo 07/03/2019  . Acute on chronic diastolic CHF (congestive heart failure) ([place]) 03/02/2019  . Acute renal failure superimposed on chronic kidney disease ([place]) 03/02/2019  . Anemia associated with chronic renal failure 10/25/2018  . Anemia 08/06/2018  . Acute hemorrhagic gastritis   . Stomach irritation   . Angiodysplasia of stomach and duodenum   . CKD (chronic kidney disease), stage III 08/04/2018  . Hyperkalemia 07/04/2018  . Atrial fibrillation with RVR ([place]) 04/23/2018  . Syncope 04/22/2018  . Acute respiratory failure with hypoxia and hypercapnia ([place]) 02/21/2017  . Adjustment disorder with anxiety 01/28/2017  . Acute on chronic respiratory failure with hypoxia ([place]) 01/23/2017  . Palliative care encounter   . Goals of care, counseling/discussion   . DNR (do not resuscitate) 10/05/2016  . Palliative care by specialist 10/05/2016  . Depression, major, recurrent, severe with psychosis ([place]) 10/05/2016  . Severe major depression, single episode, with psychotic features ([place]) 10/04/2016  . Cellulitis of leg, right 09/29/2016  . Proctitis 09/29/2016  . Hypercapnia 09/29/2016  . Acute urinary retention 09/29/2016  . UTI (urinary tract infection) 09/29/2016  . Weakness 09/29/2016  . Subacute delirium 09/28/2016  . Gastrointestinal hemorrhage   . Diffuse abdominal pain   . Altered mental status 09/24/2016  . Pressure injury of skin 09/02/2016  . Respiratory failure with hypoxia ([place])  09/01/2016  . Lymphedema 08/16/2016  . Acute on chronic heart failure with preserved ejection fraction (HFpEF) ([place]) 08/09/2016  . Acquired lymphedema of leg 04/21/2016  . Congestive heart failure ([place]) 11/25/2015  . Nocturia 11/05/2015  . Urinary frequency 11/05/2015  . Acute respiratory failure with hypoxia ([place]) 09/05/2015  . 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 Incontinence 04/20/2015  . Diabetes  mellitus, type 2 ([place]) 04/17/2015  . ESBL (extended spectrum beta-lactamase) producing bacteria infection 04/17/2015  . Hypertension 04/17/2015  . Frequent UTI 04/17/2015  . Absence of bladder continence 04/17/2015  . Iron deficiency anemia 03/08/2015  . Symptomatic anemia 09/25/2014  . Abdominal pain, lower 09/25/2014  . Urge incontinence 02/19/2013  . FOM (frequency of micturition) 02/19/2013  . Bladder infection, chronic 08/11/2012  . Difficult or painful urination 07/26/2012  . Lower urinary tract infection 12/31/2011  . Diabetes mellitus ([place]) 12/31/2011    Past Surgical History:  Procedure Laterality Date  . CARDIAC CATHETERIZATION    . CHOLECYSTECTOMY    . COLONOSCOPY N/A 08/06/2018   Procedure: COLONOSCOPY;  Surgeon: Virgel Manifold, MD;  Location: [facility] ENDOSCOPY;  Service: Endoscopy;  Laterality: N/A;  . DIALYSIS/PERMA CATHETER INSERTION N/A 03/16/2019   Procedure: DIALYSIS/PERMA CATHETER INSERTION;  Surgeon: Algernon Huxley, MD;  Location: [place] [facility] LAB;  Service: Cardiovascular;  Laterality: N/A;  . DIALYSIS/PERMA CATHETER INSERTION N/A 05/29/2019   Procedure: DIALYSIS/PERMA CATHETER INSERTION;  Surgeon: Katha Cabal, MD;  Location: [place] [facility];  Service: Cardiovascular;  Laterality: N/A;  . ESOPHAGOGASTRODUODENOSCOPY N/A 08/06/2018   Procedure: ESOPHAGOGASTRODUODENOSCOPY (EGD);  Surgeon: Virgel Manifold, MD;  Location: [facility] ENDOSCOPY;  Service: Endoscopy;  Laterality: N/A;  . FOOT SURGERY    . JOINT REPLACEMENT    . MASTECTOMY Right 1997  . NASAL SEPTUM SURGERY    . PERIPHERAL VASCULAR CATHETERIZATION N/A 07/08/2015   Procedure: PICC Line Insertion;  Surgeon: Algernon Huxley, MD;  Location: [place] [facility];  Service: Cardiovascular;  Laterality: N/A;  . TEMPORARY DIALYSIS CATHETER N/A 03/09/2019   Procedure: TEMPORARY DIALYSIS CATHETER INSERTION;  Surgeon: Algernon Huxley, MD;  Location: [place] [facility];  Service: Cardiovascular;  Laterality:  N/A;  . TONSILLECTOMY    . Vocal cords      Allergies Sulfa antibiotics, Biaxin [clarithromycin], Buspar [buspirone], Influenza a (h1n1) monoval vac, Morphine, Pyridium [phenazopyridine hcl], Tuberculin tests, Ceftriaxone, Latex, Prednisone, and Tape  Social History Social History   Tobacco Use  . Smoking status: Former Research scientist (life sciences)  . Smokeless tobacco: Former Systems developer    Quit date: 10/29/1995  Substance Use Topics  . Alcohol use: No    Alcohol/week: 0.0 standard drinks  . Drug use: No    Review of Systems Constitutional: Negative for fever. Cardiovascular: Negative for chest pain. Respiratory: Positive for shortness of breath Gastrointestinal: Negative for abdominal pain, vomiting and diarrhea. Musculoskeletal: Negative for back pain. Skin: Negative for rash. Neurological: Negative for headaches, positive for weakness  All systems negative/normal/unremarkable except as stated in the HPI  ____________________________________________   PHYSICAL EXAM:  VITAL SIGNS: ED Triage Vitals  Enc Vitals Group     BP --      Pulse --      Resp --      Temp --      Temp src --      SpO2 --      Weight 09/10/19 1316 260 lb (117.9 kg)     Height 09/10/19 1316 5\' 7"  (1.702 m)     Head Circumference --  Peak Flow --      Pain Score 09/10/19 1315 5     Pain Loc --      Pain Edu? --      Excl. in [place]? --    Constitutional: Alert and oriented.  Chronically ill-appearing, mild distress Eyes: Conjunctivae are pale. Normal extraocular movements. ENT      Head: Normocephalic and atraumatic.      Nose: No congestion/rhinnorhea.      Mouth/Throat: Mucous membranes are moist.      Neck: No stridor. Cardiovascular: Rapid rate, regular rhythm. No murmurs, rubs, or gallops. Respiratory: Normal respiratory effort without tachypnea nor retractions.  Diminished breath sounds bilaterally Gastrointestinal: Soft and nontender. Normal bowel sounds Musculoskeletal: Nontender with normal range of  motion in extremities. No lower extremity tenderness nor edema. Neurologic:  Normal speech and language. No gross focal neurologic deficits are appreciated.  Skin:  Skin is warm, dry and intact. No rash noted. Psychiatric: Mood and affect are normal. Speech and behavior are normal.  ____________________________________________  EKG: Interpreted by me.  Atrial flutter with a 2-1 AV conduction, ST and T wave abnormalities, long QT  ____________________________________________  ED COURSE:  As part of my medical decision making, I reviewed the following data within the [place] History obtained from family if available, nursing notes, old chart and ekg, as well as notes from prior ED visits. Patient presented for dyspnea, we will assess with labs and imaging as indicated at this time.   Procedures  TRENT THEISEN was evaluated in Emergency Department on 09/10/2019 for the symptoms described in the history of present illness. She was evaluated in the context of the global COVID-19 pandemic, which necessitated consideration that the patient might be at risk for infection with the SARS-CoV-2 virus that causes COVID-19. Institutional protocols and algorithms that pertain to the evaluation of patients at risk for COVID-19 are in a state of rapid change based on information released by regulatory bodies including the CDC and federal and state organizations. These policies and algorithms were followed during the patient's care in the ED.  ____________________________________________   LABS (pertinent positives/negatives)  Labs Reviewed  BASIC METABOLIC PANEL - Abnormal; Notable for the following components:      Result Value   Chloride 94 (*)    Glucose, Bld 281 (*)    BUN 26 (*)    Creatinine, Ser 5.81 (*)    GFR calc non Af Amer 7 (*)    GFR calc Af Amer 8 (*)    All other components within normal limits  CBC - Abnormal; Notable for the following components:   WBC 12.6 (*)     RBC 3.79 (*)    Hemoglobin 10.5 (*)    HCT 34.2 (*)    Platelets 462 (*)    All other components within normal limits  BRAIN NATRIURETIC PEPTIDE - Abnormal; Notable for the following components:   B Natriuretic Peptide 869.0 (*)    All other components within normal limits  TROPONIN I (HIGH SENSITIVITY) - Abnormal; Notable for the following components:   Troponin I (High Sensitivity) 2,336 (*)    All other components within normal limits  SARS CORONAVIRUS 2 (TAT 6-24 HRS)  TYPE AND SCREEN    RADIOLOGY Images were viewed by me  Chest x-ray  IMPRESSION:  1. Stable cardiomegaly with mild vascular congestion. No focal  consolidation or pneumothorax.  2. Right apical subpleural crescentic density may represent trace  pleural effusion.  ____________________________________________  DIFFERENTIAL DIAGNOSIS   MI, CHF, COPD, pneumonia, COVID-19, arrhythmia, anemia, occult bleeding  FINAL ASSESSMENT AND PLAN  Dyspnea, dizziness, atrial flutter, elevated troponin   Plan: The patient had presented for shortness of breath and dizziness. Patient's labs did reveal a troponin of 2336 in the setting of end-stage renal disease on dialysis. Patient's imaging revealed stable cardiomegaly with mild vascular congestion.  Given her IV metoprolol for rate control, she may require Cardizem drip at some point.  We have placed her on a heparin drip for her elevated troponin.  I discussed with Dr. Fletcher Anon who agrees with this plan.  I will discuss with the hospitalist for admission.   Laurence Aly, MD    Note: This note was generated in part or whole with voice recognition software. Voice recognition is usually quite accurate but there are transcription errors that can and very often do occur. I apologize for any typographical errors that were not detected and corrected.     Earleen Newport, MD 09/10/19 [phone]

## 2019-09-10 NOTE — ED Notes (Signed)
Repeat EKG obtained by this RN due to patient calling out and c/o feeling like her heart was "beating funny". Pt with noted EKG changes from the 1st. Dr. Ellender Hose shown EKG. Dr. Arbutus Ped, admitting MD made aware via secure chat of patient's complaints. Repeat EKG exported for MD to review. WIll continue to monitor for further changes in patient condition.

## 2019-09-10 NOTE — ED Notes (Signed)
Heparin verified with Sam RN

## 2019-09-10 NOTE — ED Notes (Signed)
Per [facility] facility where pt resides called for update on pt. Informed them that pt is going to be admitted to hospital.

## 2019-09-10 NOTE — ED Notes (Signed)
Dr. Flossie Buffy paged per Dr. Arbutus Ped regarding EKG changes at this time. Pt now also c/o nausea.

## 2019-09-10 NOTE — ED Triage Notes (Addendum)
Pt bought straight back from triage by EDT Dorian, states that bp was low 80's/50's.   Pt here via [facility] after dialysis today. Pt reports dizziness and increasing shortness of breath- pt states she did not complete treatment, she states she had about 30-45 minutes of tx. Pt given 324 ASA with EMS, blood pressure 40H/68G systolic.  Pt on 3L chronic via [place].

## 2019-09-10 NOTE — ED Notes (Signed)
Date and time results received: 09/10/19 2022 (use smartphrase ".now" to insert current time)  Test: Covid Critical Value: +  Name of Provider Notified: Dr. Arbutus Ped  Orders Received? Or Actions Taken?: Critical results acknowledged

## 2019-09-10 NOTE — ED Notes (Signed)
EDT Elmyra Ricks and Dorian and this RN moved patient to hospital bed. Pt given ice chips. Pt given blanket and repositioned.

## 2019-09-10 NOTE — ED Notes (Signed)
Pt given phone to call family.

## 2019-09-10 NOTE — ED Notes (Signed)
Pt able to take medications with apple sauce, states that she feels "about the same". CBG obtained by this RN. Pt remains in hospital bed for comfort. Lights dimmed for patient comfort. Denies any further needs. Will continue to monitor for further patient needs.

## 2019-09-10 NOTE — ED Notes (Signed)
Date and time results received: 09/10/19 1420 (use smartphrase ".now" to insert current time)  Test: troponin Critical Value: 2336  Name of Provider Notified: Jimmye Norman  Orders Received? Or Actions Taken?: Orders Received - See Orders for details

## 2019-09-10 NOTE — Consult Note (Signed)
Cardiology Consultation:   Patient ID: JAUNA RACZYNSKI MRN: [id number]; DOB: 1941/09/06  Admit date: 09/10/2019 Date of Consult: 09/10/2019  Primary Care Provider: System, Provider Not In Primary Cardiologist: Kathlyn Sacramento, MD  Primary Electrophysiologist:  None    Patient Profile:   Carolyn Valdez is a 78 y.o. female with a hx of HFpEF (EF 50-55%, 02/2019), PAH (RVSP 72.52mmHg 02/2019), recurrent UTI, GIB 07/2018 s/p transfusion then EGD and argon plasma coagulation, HTN, HLD, ESRD on HD MWF, IDDM  (A1C 06/2019 7.7), obesity, anxiety, breast cancer, and who is being seen today for the evaluation of NSTEMI at the request of Dr. Jimmye Norman.  History of Present Illness:   [name] is a 78 year old female with PMH as above.  She reports a past history of smoking but is unable to recall a quit date, estimating a date of quitting at least 20 years ago. She currently lives at [facility] assisted living and wears oxygen 3 L nasal cannula.  In the setting of COVID-19, she has been confined to her room with very limited activity.  She reports that she uses a walker to ambulate to the restroom but otherwise requires a wheelchair.  She continues to get hemodialysis on Monday Wednesday Friday schedule.    2010: She has a history of chest pain that dates back to 2010, at which time she had a stress test which was ruled nonischemic as under CV studies tab. She has not had a repeat study since then on preliminary review of records. Echo 06/2018 showed EF 60 to 65%, G2DD, pulmonary hypertension with PASP 60 to 65 mmHg, moderate TR.    07/2018: She was admitted to [facility] with GIB, requiring 2u PRBC. She also underwent diuresis with improvement in her associated SOB.  EGD showed gastritis with hemorrhage, duodenal angiectasia, and friable mucosa with bleeding.  All areas were treated with argon plasma coagulation.  She was subsequently placed on PPI.  She has been followed by hematology for persistent anemia and chronic  weakness/fatigue.  In early 2020, she was also placed on Procrit.    02/2019 echo showed EF 50-55%, mild concentric LVH, RVSP 72.7 mmHg, mild LAE, mild RAE, mild MS, moderate TR.  08/22/2019: She tested positive for coronavirus.  09/08/2019, and this past Saturday, she reportedly felt her heart racing with left-sided chest pain that radiated down her left arm.  Associated symptoms included weakness, dizziness, fatigue, shortness of breath, nausea, emesis, and diarrhea.  No loss of consciousness.  She denies any hematuria, hemoptysis, or BRBPR associated with this episode.  She was reportedly taken to "the [place] center" of her ALF and received "many pills," which eventually helped her HR and symptoms. She reports no further episodes of chest pain, emesis, or diarrhea since that time.  However, she has continued to feel short of breath, weak, and fatigued.  She has not been able to eat very well as she has remained nauseated despite no further emesis / diarrhea.  She also has continued to note some chest tightness. She is unsure if she has had increased LEE or abdominal distention. She reports she continues to elevate the head of her bed for comfort but can lay flat when needed on her 3L [place].  09/10/2019, today, she presents to [facility] ED after her blood pressure dropped during her Monday HD session, prompting dialysis to send her to [facility] ED.  In the ED, vitals significant for hypotension with blood pressure 87/47, rates elevated into the 1 120s, 100% on 3  L nasal cannula (on 3 L nasal cannula at ALF).  Labs significant for sodium 135, potassium 3.7, creatinine 5.81, BUN 26, BNP 869.0, high-sensitivity troponin 2336 and cycling, leukocytosis WBC 12.6, anemia with hemoglobin 10.5 (Baseline, 06/2019 Hgb 10.5), RBC 3.79, hematocrit 34.2, hyperglycemia with glucose 281 (A1C 06/2019 7.7). Repeat COVID-19 test results pending.  EKG showed new atrial flutter with 2-1 conduction, ventricular rate 124 bpm, poor conduction  through lead III, left axis deviation, QRS 74 ms, no acute ST/T changes. Chest x-ray significant for stable cardiomegaly with mild vascular congestion and right apical subpleural crescentic density, which was noted to be possibly representative of trace pleural effusion.  Heart Pathway Score:     Past Medical History:  Diagnosis Date   (HFpEF) heart failure with preserved ejection fraction ([place])    a. 06/2018 Echo: EF 60-65%, no rwma. Gr2 DD. Mild LVH. Ao sclerosis w/o stenosis. Mild MR. Mild to mod LAE. Nl RV fxn. Mild RAE. Mod TR. PASP 60-29mmHg.   Anxiety    Breast cancer ([place]) 1997   a. 1997 s/p right breast mastectomy   Chest pain    a. 11/2008 MV: No ischemia. EF 66%.   CKD (chronic kidney disease) stage 4, GFR 15-29 ml/min (HCC)    Coronary artery calcification seen on CT scan    a. 01/2017 Chest CT: Atherosclerotic coronary Ca2+.   Diabetes mellitus without complication (HCC)    DJD (degenerative joint disease)    Dysuria    GIB (gastrointestinal bleeding)    a. 07/2018 EGD: Gastritis w/ hemorrhage, duodenal angioectasia. Friable gastric mucosa w/ bleeding on contact. All treated w/ argon plasma coagulation; b. 07/2018 s/p 2u PRBCs.   H/O total knee replacement    right   HTN (hypertension)    Hypercholesteremia    Kidney stone    Obesity    Recurrent urinary tract infection    SVT (supraventricular tachycardia) (HCC)    Urinary incontinence    Vaginal atrophy    Yeast vaginitis     Past Surgical History:  Procedure Laterality Date   CARDIAC CATHETERIZATION     CHOLECYSTECTOMY     COLONOSCOPY N/A 08/06/2018   Procedure: COLONOSCOPY;  Surgeon: Virgel Manifold, MD;  Location: [facility] ENDOSCOPY;  Service: Endoscopy;  Laterality: N/A;   DIALYSIS/PERMA CATHETER INSERTION N/A 03/16/2019   Procedure: DIALYSIS/PERMA CATHETER INSERTION;  Surgeon: Algernon Huxley, MD;  Location: [place] [facility] LAB;  Service: Cardiovascular;  Laterality: N/A;    DIALYSIS/PERMA CATHETER INSERTION N/A 05/29/2019   Procedure: DIALYSIS/PERMA CATHETER INSERTION;  Surgeon: Katha Cabal, MD;  Location: [place] [facility];  Service: Cardiovascular;  Laterality: N/A;   ESOPHAGOGASTRODUODENOSCOPY N/A 08/06/2018   Procedure: ESOPHAGOGASTRODUODENOSCOPY (EGD);  Surgeon: Virgel Manifold, MD;  Location: [facility] ENDOSCOPY;  Service: Endoscopy;  Laterality: N/A;   FOOT SURGERY     JOINT REPLACEMENT     MASTECTOMY Right 1997   NASAL SEPTUM SURGERY     PERIPHERAL VASCULAR CATHETERIZATION N/A 07/08/2015   Procedure: PICC Line Insertion;  Surgeon: Algernon Huxley, MD;  Location: [place] [facility];  Service: Cardiovascular;  Laterality: N/A;   TEMPORARY DIALYSIS CATHETER N/A 03/09/2019   Procedure: TEMPORARY DIALYSIS CATHETER INSERTION;  Surgeon: Algernon Huxley, MD;  Location: [place] [facility];  Service: Cardiovascular;  Laterality: N/A;   TONSILLECTOMY     Vocal cords       Home Medications:  Prior to Admission medications   Medication Sig Start Date End Date Taking? Authorizing Provider  acetaminophen (  TYLENOL) 325 MG tablet Take 2 tablets (650 mg total) by mouth every 6 (six) hours as needed for mild pain (or Fever >/= 101). 04/26/18   Loletha Grayer, MD  albuterol (PROVENTIL HFA;VENTOLIN HFA) 108 (90 Base) MCG/ACT inhaler Inhale 2 puffs into the lungs every 6 (six) hours as needed for wheezing or shortness of breath. 07/14/18   Gouru, Illene Silver, MD  amoxicillin (AMOXIL) 500 MG capsule Take 2,000 mg by mouth as directed. Prior to dental procedure    [provider]  atorvastatin (LIPITOR) 40 MG tablet Take 1 tablet (40 mg total) by mouth daily at 6 PM. Patient taking differently: Take 40 mg by mouth at bedtime.  03/19/19   Dustin Flock, MD  calcium acetate (PHOSLO) 667 MG capsule Take 1,334 mg by mouth 3 (three) times daily before meals.    [provider]  camphor-menthol (MEN-PHOR) lotion Apply 1 application topically every 6 (six)  hours as needed for itching.    [provider]  citalopram (CELEXA) 10 MG tablet Take 1 tablet (10 mg total) by mouth daily. 02/04/17   Loletha Grayer, MD  clonazePAM (KLONOPIN) 0.25 MG disintegrating tablet Take 1 tablet (0.25 mg total) by mouth every 12 (twelve) hours as needed (anxiety). 03/19/19   Dustin Flock, MD  docusate sodium (COLACE) 100 MG capsule Take 100 mg by mouth 2 (two) times daily.     [provider]  ferric citrate (AURYXIA) 1 GM 210 MG(Fe) tablet Take 420 mg by mouth 3 (three) times daily with meals.     [provider]  fluticasone (FLONASE) 50 MCG/ACT nasal spray Place 1 spray into both nostrils daily.     [provider]  glipiZIDE (GLUCOTROL) 5 MG tablet Take 5 mg by mouth daily before breakfast.    [provider]  hydrALAZINE (APRESOLINE) 25 MG tablet Take 1 tablet (25 mg total) by mouth 3 (three) times daily with meals. 03/19/19   Dustin Flock, MD  insulin aspart (NOVOLOG) 100 UNIT/ML injection 3 (three) times daily before meals. Inject per sliding scale 0-199=0, 200-500=5 units, notify MD for blood sugar> or equal to 500    [provider]  insulin glargine (LANTUS) 100 UNIT/ML injection Inject 0.4 mLs (40 Units total) into the skin daily. Patient taking differently: Inject 45 Units into the skin at bedtime.  03/19/19   Dustin Flock, MD  iron polysaccharides (NIFEREX) 150 MG capsule Take 150 mg by mouth daily.    [provider]  lamoTRIgine (LAMICTAL) 25 MG tablet Take 1 tablet (25 mg total) by mouth daily. 04/26/18   Loletha Grayer, MD  levETIRAcetam (KEPPRA) 250 MG tablet Take 1 tablet (250 mg total) by mouth 2 (two) times daily. 10/26/16   Epifanio Lesches, MD  loratadine (CLARITIN) 10 MG tablet Take 10 mg by mouth daily.    [provider]  losartan (COZAAR) 50 MG tablet Take 50 mg by mouth daily.    [provider]  Melatonin 3 MG TABS Take 6 mg by mouth at bedtime.    [provider]  Meth-Hyo-M Bl-Na Phos-Ph Sal (URIBEL) 118 MG CAPS Take 118 mg by mouth every 6 (six) hours as needed (uti symptoms).    [provider]  metoprolol tartrate (LOPRESSOR) 50 MG tablet Take 50 mg by mouth 2 (two) times daily.    [provider]  mirabegron ER (MYRBETRIQ) 25 MG TB24 tablet Take 25 mg by mouth daily.    [provider]  Multiple Vitamin (MULTIVITAMIN) tablet  Take 1 tablet by mouth daily.    [provider]  nystatin (NYSTATIN) powder Apply 1 g topically See admin instructions. Apply to under breast and abdominal once daily & apply every 12 hours as needed for abdominal folds, groin, and under breast    [provider]  OLANZapine (ZYPREXA) 5 MG tablet Take 5 mg by mouth at bedtime.    [provider]  ondansetron (ZOFRAN-ODT) 8 MG disintegrating tablet Take 8 mg by mouth every 8 (eight) hours as needed for nausea/vomiting. 08/02/18   [provider]  oxyCODONE-acetaminophen (PERCOCET/ROXICET) 5-325 MG tablet Take 1 tablet by mouth every 6 (six) hours as needed for moderate pain or severe pain. 03/19/19   Dustin Flock, MD  pantoprazole (PROTONIX) 40 MG tablet Take 40 mg by mouth daily.     [provider]  polyethylene glycol (MIRALAX / GLYCOLAX) packet Take 17 g by mouth daily. Patient taking differently: Take 17 g by mouth as needed.  04/26/18   Loletha Grayer, MD  saccharomyces boulardii (FLORASTOR) 250 MG capsule Take 250 mg by mouth daily.    [provider]  sennosides-docusate sodium (SENOKOT-S) 8.6-50 MG tablet Take 1 tablet by mouth at bedtime.    [provider]  tiZANidine (ZANAFLEX) 2 MG tablet Take 2 mg by mouth every 8 (eight) hours as needed for muscle spasms.    [provider]  torsemide (DEMADEX) 20 MG tablet Take 2 tablets (40 mg total) by mouth daily. Patient taking differently: Take 20 mg by mouth daily.  07/15/18   Nicholes Mango, MD  vitamin B-12  (CYANOCOBALAMIN) 1000 MCG tablet Take 1,000 mcg by mouth daily.    [provider]    Inpatient Medications: Scheduled Meds:  Continuous Infusions:  heparin 1,100 Units/hr (09/10/19 1512)   PRN Meds:   Allergies:    Allergies  Allergen Reactions   Sulfa Antibiotics Hives   Biaxin [Clarithromycin] Hives   Buspar [Buspirone]    Influenza A (H1n1) [place] Vac Other (See Comments)    Pt states that she was told by her MD not to get the influenza vaccine.     Morphine Other (See Comments)    Reaction:  Dizziness and confusion    Pyridium [Phenazopyridine Hcl] Other (See Comments)    Reaction:  Unknown    Tuberculin Tests    Ceftriaxone Anxiety   Latex Rash   Prednisone Rash   Tape Rash    Social History:   Social History   Socioeconomic History   Marital status: Widowed    Spouse name: Not on file   Number of children: Not on file   Years of education: Not on file   Highest education level: Not on file  Occupational History   Not on file  Social Needs   Financial resource strain: Not on file   Food insecurity    Worry: Not on file    Inability: Not on file   Transportation needs    Medical: Not on file    Non-medical: Not on file  Tobacco Use   Smoking status: Former Smoker   Smokeless tobacco: Former Systems developer    Quit date: 10/29/1995  Substance and Sexual Activity   Alcohol use: No    Alcohol/week: 0.0 standard drinks   Drug use: No   Sexual activity: Never  Lifestyle   Physical activity    Days per week: Not on file    Minutes per session: Not on file   Stress: Not on file  Relationships   Social Herbalist on phone: Not on file    Gets together: Not on file    Attends religious service: Not on file    Active member of club or organization: Not on file    Attends meetings of clubs or organizations: Not on file    Relationship status: Not on file   Intimate partner violence    Fear of current or ex  partner: Not on file    Emotionally abused: Not on file    Physically abused: Not on file    Forced sexual activity: Not on file  Other Topics Concern   Not on file  Social History Narrative   Not on file    Family History:     Family History  Problem Relation Age of Onset   Lung cancer Father    Hematuria Mother    Lung cancer Mother    Kidney disease Neg Hx    Bladder Cancer Neg Hx    Breast cancer Neg Hx      ROS:  Please see the history of present illness.  Review of Systems  Constitutional: Positive for malaise/fatigue.  Respiratory: Positive for shortness of breath. Negative for hemoptysis.   Cardiovascular: Negative for chest pain, palpitations, orthopnea and leg swelling.       Elevated head of bed due to comfort. Stated she is able to lay flat on her normal 3L [place] oxygen.  Gastrointestinal: Positive for diarrhea, nausea and vomiting. Negative for abdominal pain, blood in stool and melena.       No further v/d since 11/21  Genitourinary: Negative for hematuria.  Musculoskeletal: Negative for falls.  Neurological: Positive for dizziness and weakness. Negative for focal weakness.  All other systems reviewed and are negative.   All other ROS reviewed and negative.     Physical Exam/Data:   Vitals:   09/10/19 1356 09/10/19 1400 09/10/19 1415 09/10/19 1430  BP: 105/64 104/64  116/69  Pulse: (!) 118 (!) 119 (!) 117 (!) 117  Resp: (!) 24 (!) 22 (!) 21 (!) 23  SpO2: 100% 100% 100% 100%  Weight:      Height:       No intake or output data in the 24 hours ending 09/10/19 1520 Last 3 Weights 09/10/2019 08/16/2019 07/06/2019  Weight (lbs) 260 lb 247 lb 8 oz 270 lb 8.1 oz  Weight (kg) 117.935 kg 112.265 kg 122.7 kg     Body mass index is 40.72 kg/m.  General: Obese female, in no acute distress. HEENT: normal Neck: JVD difficult to assess due to body habitus. Vascular: No carotid bruits; radial pulses 2+ bilaterally Cardiac:  normal S1, S2; tachycardic but  regular; no murmur, 1/6 systolic murmur Lungs: Reduced respiratory effort, reduced R base breath sounds with trace crackles, slight expiratory wheeze Abd: Limited by guarding with patient denying no tenderness to palpation.  Somewhat firm in the setting of guarding. no hepatomegaly  Ext: no appreciated pitting bilateral edema Musculoskeletal:  No deformities, BUE and BLE strength normal and equal Skin: warm and dry  Neuro:  No focal abnormalities noted Psych:  Normal affect   EKG:  The EKG was personally reviewed and demonstrates: EKG showed new atrial flutter with 2-1 conduction, ventricular rate 124 bpm, poor conduction through lead III, left axis deviation, QRS 74 ms, no acute ST/T changes.  Telemetry:  Telemetry was personally reviewed and demonstrates: atrial flutter with rates into the 120s  Relevant CV Studies: Echo 02/2019  1. The left ventricle has low normal systolic function, with an ejection fraction of 50-55%. The cavity size was mildly dilated. There is mild concentric left ventricular hypertrophy. Left ventricular diastolic Doppler parameters are consistent with  pseudonormalization.  2. The right ventricle has normal systolic function. The cavity was normal. There is no increase in right ventricular wall thickness. Right ventricular systolic pressure is severely elevated with an estimated pressure of 72.7 mmHg.  3. Left atrial size was mildly dilated.  4. Right atrial size was mildly dilated.  5. The mitral valve is grossly normal. There is moderate mitral annular calcification present. Mild mitral valve stenosis.  6. The tricuspid valve is grossly normal. Tricuspid valve regurgitation is moderate.  7. The aortic valve is grossly normal. Aortic valve regurgitation was not assessed by color flow Doppler.  Laboratory Data:  High Sensitivity Troponin:   Recent Labs  Lab 09/10/19 1329  TROPONINIHS 2,336*     Cardiac EnzymesNo results for input(s): TROPONINI in the last 168  hours. No results for input(s): TROPIPOC in the last 168 hours.  Chemistry Recent Labs  Lab 09/10/19 1329  NA 135  K 3.7  CL 94*  CO2 26  GLUCOSE 281*  BUN 26*  CREATININE 5.81*  CALCIUM 9.0  GFRNONAA 7*  GFRAA 8*  ANIONGAP 15    No results for input(s): PROT, ALBUMIN, AST, ALT, ALKPHOS, BILITOT in the last 168 hours. Hematology Recent Labs  Lab 09/10/19 1329  WBC 12.6*  RBC 3.79*  HGB 10.5*  HCT 34.2*  MCV 90.2  MCH 27.7  MCHC 30.7  RDW 15.1  PLT 462*   BNP Recent Labs  Lab 09/10/19 1329  BNP 869.0*    DDimer No results for input(s): DDIMER in the last 168 hours.   Radiology/Studies:  Dg Chest Port 1 View  Result Date: 09/10/2019 CLINICAL DATA:  78 year old female with shortness of breath. EXAM: PORTABLE CHEST 1 VIEW COMPARISON:  Chest radiograph dated 07/02/2019. FINDINGS: Right-sided dialysis catheter in similar position. There is stable cardiomegaly with mild vascular congestion. No edema. No focal consolidation or pneumothorax. Right apical subpleural crescentic density may represent scarring or trace loculated effusion. There is atherosclerotic calcification of the aortic arch. No acute osseous pathology. IMPRESSION: 1. Stable cardiomegaly with mild vascular congestion. No focal consolidation or pneumothorax. 2. Right apical subpleural crescentic density may represent trace pleural effusion. Electronically Signed   By: Anner Crete M.D.   On: 09/10/2019 13:48    Assessment and Plan:   NSTEMI --SOB continues with weakness, fatigue, and chest tightness. Denies further CP since Saturday's episode of racing heart rate, left-sided chest pain that radiated down her arm, shortness of breath, nausea with emesis, and diarrhea. She is on 3L [place] at home. --H/o CP, dating back to 2010, but no further workup since her low risk 2010 stress test.  --EKG significant for atrial flutter with rates elevated into the 120s; no significant ST or T changes. High-sensitivity  troponin elevated at 2,336.  Continue to cycle until peaked and downtrending. --Consider supply demand ischemia in the setting of atrial flutter with rapid ventricular rate, as well as HFpEF with most recent echo as above showing normal EF but severely elevated RVSP 72.7 mmHg. Also consider recent COVID-19 infection (positive on 11/4). Cannot rule out elevated troponin due to cardiac ischemia, however, given the above findings with HS Tn elevated and still cycling and risk factors for cardiac etiology including past history of smoking.   --Pending repeat COVID-19 test. Tested positive  for COVID-19 08/22/2019. --Started on IV heparin.  Daily CBC. --N.p.o. after midnight in preparation for possible cardiac catheterization tomorrow. Risks and benefits of cardiac catheterization have been discussed with the patient.  These include bleeding, infection, kidney damage, stroke, heart attack, death.  The patient understands these risks and is willing to proceed. --Continue medical management with ASA and PPI, as well as SL nitro as needed for CP. Continue PTA statin for risk factor modification (update LDL). PTA medications also include losartan 50mg  daily, torsemide 20mg  daily, and lopressor 50mg  BID, hydralazine 25mg  TID -- all of which are currently held for hypotension. Restart of BB precluded by hypotension with restart recommended as soon as tolerated. Continue IV heparin. Further recommendations following cath.   New onset Atrial flutter with RVR --Feels racing HR since this past Saturday. Denies palpitations. No previous h/o atrial flutter on review of chart. --Current rate control limited by hypotension and escalate as tolerated for goal rates under 110. Last BP 99/44.  --CHA2DS2VASc score of at least 7 (CHF, HTN, agex2, DM2, vascular, female). Will need to consider risks versus benefits of [place] at discharge with h/o GIB as above and as relatively unstable and deconditioned. Continue on heparin for now with  daily CBC and current Hgb stable from previous labs. Of note, ambulates at ALF with cane to bathroom but otherwise in wheelchair. No plan for cardioversion at this time as not therapeutically anticoagulated.   Acute on chronic HFpEF, severe pulmonary hypertension --SOB on 3L [place] (home 3L [place]).  Denies any lower extremity edema or abdominal distention. --02/2019 EF normal as above.  Mild LVH, severe pulmonary hypertension with RVSP 72.7 mmHg, which is likely contributing to shortness of breath.  Consider also moderate TR. --Slightly volume up on exam with trace R crackles and reduced breath sounds appreciated at L lower base.  BNP elevated 869.0. No significant lower extremity edema.  Abdominal exam limited by guarding with patient denying tenderness to palpation.  --Consider consulting nephrology, especially as ESRD on HD with h/o catheter thrombosis (as below) and as patient could not complete HD session today. Will defer volume management to nephrology with current oral torsemide held due to hypotension. --Continue to monitor with daily standing weights.  08/16/2019 weight 112.3 kg with most recent weight 117.9 kg. Continue to monitor I/os. Daily BMET. Replete electrolytes. K 3.7 with goal 4.0. Check Mg.  --Current restart of HF therapy limited by SBP in 80s-90s. PTA medications of note include losartan 50mg  daily, torsemide 20mg  daily, hydralazine 25mg  TID, and lopressor 50mg  BID. Plan for cath as above with further recommendations at that time.   HTN --Currently soft BP. Became hypotensive at HD today, resulting in her referral to the ED. Avoid prerenal AKI. Hold antihypertensives at this time. PTA medications include losartan 50mg  daily, torsemide 20mg  daily, and lopressor 50mg  BID.   HLD --Continue PTA Lipitor.   ESRD on HD (M, W, F) --As above, became hypotensive during hemodialysis today, prompting her presenting to [facility] ED.   --Daily BMET with Cr 5.81 and BUN 26 on HD. 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  --Consider consulting nephrology if not yet done at this time. Will defer volume management to nephrology.  Moderate TR --Recommend periodic echocardiogram to monitor.  Consider as also contributing to shortness of breath.  Leukocytosis --Per IM. WBC 12.6.  Anemia with h/o GIB 2019 --Stable from previous CBC. See HPI above with h/o GIB  EGD and argon plasma coagulation for bleeding / friable mucosa. Denies s/sx of bleeding. Monitor closely on heparin.  IDDM --SSI, poorly controlled. Last A1C 7.7. Per IM.  Former smoker --Continued cessation advised.  Estimated quit date of approximately 20 years ago.  She is on 3 L nasal cannula oxygen.   History of breast cancer --1997 s/p R mastectomy.  Anxiety --Recommend hold PTA klonopin for now given hypotension.   For questions or updates, please contact [place] Please consult [email or web address] for contact info under     Signed, Arvil Chaco, PA-C  09/10/2019 3:20 PM

## 2019-09-10 NOTE — ED Notes (Signed)
Pt's sister updated about patient.

## 2019-09-10 NOTE — ED Notes (Signed)
Pt given meal tray at this time, per orders pt is NPO after midnight.

## 2019-09-10 NOTE — ED Notes (Addendum)
Attempted to call report to 2A. Per assigned nurse Illene Bolus, pt cannot be admitted to floor due to previous covid + status 11/4. Admitting doctor was contacted and she stated that patient would need to be on airborne isolation for 28 days. Due to need for airborne isolation need, nurse stated that pt could not come to floor due to staffing issues. ED charge Nira Conn made aware.

## 2019-09-10 NOTE — ED Notes (Signed)
X-ray at bedside

## 2019-09-10 NOTE — Consult Note (Signed)
ANTICOAGULATION CONSULT NOTE - Initial Consult  Pharmacy Consult for Heparin Infusion Indication: chest pain/ACS  Allergies  Allergen Reactions  . Sulfa Antibiotics Hives  . Biaxin [Clarithromycin] Hives  . Buspar [Buspirone]   . Influenza A (H1n1) Monoval Vac Other (See Comments)    Pt states that she was told by her MD not to get the influenza vaccine.    . Morphine Other (See Comments)    Reaction:  Dizziness and confusion   . Pyridium [Phenazopyridine Hcl] Other (See Comments)    Reaction:  Unknown   . Tuberculin Tests   . Ceftriaxone Anxiety  . Latex Rash  . Prednisone Rash  . Tape Rash    Patient Measurements: Height: 5\' 7"  (170.2 cm) Weight: 260 lb (117.9 kg) IBW/kg (Calculated) : 61.6 Heparin Dosing Weight: 89.3 kg  Vital Signs: BP: 113/53 (11/23 2300) Pulse Rate: 114 (11/23 2300)  Labs: Recent Labs    09/10/19 1329 09/10/19 1515 09/10/19 2226  HGB 10.5*  --   --   HCT 34.2*  --   --   PLT 462*  --   --   APTT  --  35  --   LABPROT  --  13.9  --   INR  --  1.1  --   HEPARINUNFRC  --   --  0.46  CREATININE 5.81*  --   --   TROPONINIHS 2,336* 2,021*  --     Estimated Creatinine Clearance: 10.9 mL/min (A) (by C-G formula based on SCr of 5.81 mg/dL (H)).   Medical History: Past Medical History:  Diagnosis Date  . (HFpEF) heart failure with preserved ejection fraction ([place])    a. 06/2018 Echo: EF 60-65%, no rwma. Gr2 DD. Mild LVH. Ao sclerosis w/o stenosis. Mild MR. Mild to mod LAE. Nl RV fxn. Mild RAE. Mod TR. PASP 60-29mmHg.  Marland Kitchen Anxiety   . Breast cancer ([place]) 1997   a. 1997 s/p right breast mastectomy  . Chest pain    a. 11/2008 MV: No ischemia. EF 66%.  . CKD (chronic kidney disease) stage 4, GFR 15-29 ml/min (HCC)   . Coronary artery calcification seen on CT scan    a. 01/2017 Chest CT: Atherosclerotic coronary Ca2+.  . Diabetes mellitus without complication ([place])   . DJD (degenerative joint disease)   . Dysuria   . GIB (gastrointestinal  bleeding)    a. 07/2018 EGD: Gastritis w/ hemorrhage, duodenal angioectasia. Friable gastric mucosa w/ bleeding on contact. All treated w/ argon plasma coagulation; b. 07/2018 s/p 2u PRBCs.  . H/O total knee replacement    right  . HTN (hypertension)   . Hypercholesteremia   . Kidney stone   . Obesity   . Recurrent urinary tract infection   . SVT (supraventricular tachycardia) ([place])   . Urinary incontinence   . Vaginal atrophy   . Yeast vaginitis     Medications:  (Not in a hospital admission)  Scheduled:  . [START ON 09/11/2019] aspirin  81 mg Oral Daily  . atorvastatin  40 mg Oral QHS  . digoxin  0.25 mg Intravenous Once  . [START ON 09/11/2019] insulin aspart  0-15 Units Subcutaneous TID WC  . metoprolol tartrate  12.5 mg Oral Q6H  . pantoprazole  40 mg Oral Daily   Infusions:  . heparin 1,100 Units/hr (09/10/19 2234)   PRN:  Anti-infectives (From admission, onward)   None      Assessment: Pharmacy has consulted to initiate Heparin Infusion in 78yo patient admitted with increased  SOB and dizziness. Initial troponin level was 2336 ng/L and BNP of 869 pg/mL. Patient has no history of PTA anticoagulation use. Platelets are currently elevated(462 k/uL). Baseline labs have been ordered and are pending. Will initiate therapy immediately.  Goal of Therapy:  Heparin level 0.3-0.7 units/ml Monitor platelets by anticoagulation protocol: Yes   Plan:  Give 4000 units bolus x 1 Start heparin infusion at 1100 units/hr Check anti-Xa level in 8 hours and daily while on heparin Continue to monitor H&H and platelets   11/23: HL @ 2226 = 0.46 Will recheck HL in 8 hrs on 11/24 @ 0630  Colter Magowan D 09/10/2019,11:24 PM

## 2019-09-10 NOTE — ED Notes (Addendum)
4mg  Zofran given due to patient c/o nausea awaiting to hear from Dr. Flossie Buffy after speaking with her regarding EKG changes and pt c/o fluttering/anxiety/nausea.

## 2019-09-10 NOTE — ED Notes (Signed)
Pt given ice chips

## 2019-09-11 ENCOUNTER — Inpatient Hospital Stay: Admit: 2019-09-11 | Payer: Medicare Other

## 2019-09-11 ENCOUNTER — Inpatient Hospital Stay (HOSPITAL_COMMUNITY)
Admission: AD | Admit: 2019-09-11 | Discharge: 2019-10-02 | DRG: 308 | Disposition: A | Discharge status: Skilled Nursing Facility | Payer: Medicare Other | Source: Other Acute Inpatient Hospital | Attending: Internal Medicine | Admitting: Internal Medicine

## 2019-09-11 ENCOUNTER — Inpatient Hospital Stay: Payer: Medicare Other

## 2019-09-11 DIAGNOSIS — Z66 Do not resuscitate: Secondary | ICD-10-CM | POA: Diagnosis present

## 2019-09-11 DIAGNOSIS — J1289 Other viral pneumonia: Secondary | ICD-10-CM | POA: Diagnosis present

## 2019-09-11 DIAGNOSIS — I248 Other forms of acute ischemic heart disease: Secondary | ICD-10-CM | POA: Diagnosis present

## 2019-09-11 DIAGNOSIS — I953 Hypotension of hemodialysis: Secondary | ICD-10-CM | POA: Diagnosis present

## 2019-09-11 DIAGNOSIS — Z992 Dependence on renal dialysis: Secondary | ICD-10-CM | POA: Diagnosis not present

## 2019-09-11 DIAGNOSIS — N186 End stage renal disease: Secondary | ICD-10-CM | POA: Diagnosis present

## 2019-09-11 DIAGNOSIS — I249 Acute ischemic heart disease, unspecified: Secondary | ICD-10-CM | POA: Diagnosis not present

## 2019-09-11 DIAGNOSIS — I132 Hypertensive heart and chronic kidney disease with heart failure and with stage 5 chronic kidney disease, or end stage renal disease: Secondary | ICD-10-CM | POA: Diagnosis present

## 2019-09-11 DIAGNOSIS — E1122 Type 2 diabetes mellitus with diabetic chronic kidney disease: Secondary | ICD-10-CM | POA: Diagnosis present

## 2019-09-11 DIAGNOSIS — Z751 Person awaiting admission to adequate facility elsewhere: Secondary | ICD-10-CM

## 2019-09-11 DIAGNOSIS — I4891 Unspecified atrial fibrillation: Secondary | ICD-10-CM | POA: Diagnosis present

## 2019-09-11 DIAGNOSIS — I4892 Unspecified atrial flutter: Secondary | ICD-10-CM | POA: Diagnosis present

## 2019-09-11 DIAGNOSIS — U071 COVID-19: Secondary | ICD-10-CM | POA: Diagnosis present

## 2019-09-11 DIAGNOSIS — Z87891 Personal history of nicotine dependence: Secondary | ICD-10-CM

## 2019-09-11 DIAGNOSIS — K219 Gastro-esophageal reflux disease without esophagitis: Secondary | ICD-10-CM | POA: Diagnosis present

## 2019-09-11 DIAGNOSIS — Z96651 Presence of right artificial knee joint: Secondary | ICD-10-CM | POA: Diagnosis present

## 2019-09-11 DIAGNOSIS — I483 Typical atrial flutter: Secondary | ICD-10-CM | POA: Diagnosis not present

## 2019-09-11 DIAGNOSIS — I5032 Chronic diastolic (congestive) heart failure: Secondary | ICD-10-CM | POA: Diagnosis present

## 2019-09-11 DIAGNOSIS — L03116 Cellulitis of left lower limb: Secondary | ICD-10-CM | POA: Diagnosis present

## 2019-09-11 DIAGNOSIS — R778 Other specified abnormalities of plasma proteins: Secondary | ICD-10-CM

## 2019-09-11 DIAGNOSIS — F4322 Adjustment disorder with anxiety: Secondary | ICD-10-CM | POA: Diagnosis present

## 2019-09-11 DIAGNOSIS — E669 Obesity, unspecified: Secondary | ICD-10-CM | POA: Diagnosis present

## 2019-09-11 DIAGNOSIS — E875 Hyperkalemia: Secondary | ICD-10-CM | POA: Diagnosis present

## 2019-09-11 DIAGNOSIS — N390 Urinary tract infection, site not specified: Secondary | ICD-10-CM | POA: Clinically undetermined

## 2019-09-11 DIAGNOSIS — I214 Non-ST elevation (NSTEMI) myocardial infarction: Secondary | ICD-10-CM | POA: Diagnosis not present

## 2019-09-11 DIAGNOSIS — N189 Chronic kidney disease, unspecified: Secondary | ICD-10-CM | POA: Diagnosis not present

## 2019-09-11 DIAGNOSIS — Z87440 Personal history of urinary (tract) infections: Secondary | ICD-10-CM

## 2019-09-11 DIAGNOSIS — K6289 Other specified diseases of anus and rectum: Secondary | ICD-10-CM | POA: Diagnosis present

## 2019-09-11 DIAGNOSIS — Z794 Long term (current) use of insulin: Secondary | ICD-10-CM

## 2019-09-11 DIAGNOSIS — J9621 Acute and chronic respiratory failure with hypoxia: Secondary | ICD-10-CM | POA: Diagnosis not present

## 2019-09-11 DIAGNOSIS — Z853 Personal history of malignant neoplasm of breast: Secondary | ICD-10-CM

## 2019-09-11 DIAGNOSIS — D631 Anemia in chronic kidney disease: Secondary | ICD-10-CM | POA: Diagnosis present

## 2019-09-11 DIAGNOSIS — R0602 Shortness of breath: Secondary | ICD-10-CM | POA: Diagnosis not present

## 2019-09-11 DIAGNOSIS — E1165 Type 2 diabetes mellitus with hyperglycemia: Secondary | ICD-10-CM | POA: Diagnosis present

## 2019-09-11 DIAGNOSIS — G40909 Epilepsy, unspecified, not intractable, without status epilepticus: Secondary | ICD-10-CM | POA: Diagnosis present

## 2019-09-11 DIAGNOSIS — E119 Type 2 diabetes mellitus without complications: Secondary | ICD-10-CM

## 2019-09-11 DIAGNOSIS — Z6841 Body Mass Index (BMI) 40.0 and over, adult: Secondary | ICD-10-CM

## 2019-09-11 DIAGNOSIS — Z9011 Acquired absence of right breast and nipple: Secondary | ICD-10-CM

## 2019-09-11 DIAGNOSIS — B9620 Unspecified Escherichia coli [E. coli] as the cause of diseases classified elsewhere: Secondary | ICD-10-CM | POA: Diagnosis present

## 2019-09-11 DIAGNOSIS — Z79899 Other long term (current) drug therapy: Secondary | ICD-10-CM

## 2019-09-11 DIAGNOSIS — K5900 Constipation, unspecified: Secondary | ICD-10-CM | POA: Diagnosis present

## 2019-09-11 DIAGNOSIS — I5033 Acute on chronic diastolic (congestive) heart failure: Secondary | ICD-10-CM

## 2019-09-11 DIAGNOSIS — E871 Hypo-osmolality and hyponatremia: Secondary | ICD-10-CM

## 2019-09-11 DIAGNOSIS — E785 Hyperlipidemia, unspecified: Secondary | ICD-10-CM | POA: Diagnosis present

## 2019-09-11 DIAGNOSIS — I2721 Secondary pulmonary arterial hypertension: Secondary | ICD-10-CM | POA: Diagnosis present

## 2019-09-11 DIAGNOSIS — J988 Other specified respiratory disorders: Secondary | ICD-10-CM | POA: Diagnosis not present

## 2019-09-11 DIAGNOSIS — Z801 Family history of malignant neoplasm of trachea, bronchus and lung: Secondary | ICD-10-CM

## 2019-09-11 DIAGNOSIS — N2581 Secondary hyperparathyroidism of renal origin: Secondary | ICD-10-CM | POA: Diagnosis present

## 2019-09-11 DIAGNOSIS — I2510 Atherosclerotic heart disease of native coronary artery without angina pectoris: Secondary | ICD-10-CM | POA: Diagnosis present

## 2019-09-11 LAB — GLUCOSE, CAPILLARY
Glucose-Capillary: 256 mg/dL — ABNORMAL HIGH (ref 70–99)
Glucose-Capillary: 184 mg/dL — ABNORMAL HIGH (ref 70–99)
Glucose-Capillary: 205 mg/dL — ABNORMAL HIGH (ref 70–99)
Glucose-Capillary: 189 mg/dL — ABNORMAL HIGH (ref 70–99)

## 2019-09-11 LAB — CBC
HCT: 29.2 % — ABNORMAL LOW (ref 36.0–46.0)
Hemoglobin: 9.1 g/dL — ABNORMAL LOW (ref 12.0–15.0)
MCH: 27.7 pg (ref 26.0–34.0)
MCHC: 31.2 g/dL (ref 30.0–36.0)
MCV: 89 fL (ref 80.0–100.0)
Platelets: 396 10*3/uL (ref 150–400)
RBC: 3.28 MIL/uL — ABNORMAL LOW (ref 3.87–5.11)
RDW: 14.9 % (ref 11.5–15.5)
WBC: 13.3 10*3/uL — ABNORMAL HIGH (ref 4.0–10.5)
nRBC: 0 % (ref 0.0–0.2)

## 2019-09-11 LAB — BASIC METABOLIC PANEL
Anion gap: 11 (ref 5–15)
BUN: 34 mg/dL — ABNORMAL HIGH (ref 8–23)
CO2: 26 mmol/L (ref 22–32)
Calcium: 8.3 mg/dL — ABNORMAL LOW (ref 8.9–10.3)
Chloride: 96 mmol/L — ABNORMAL LOW (ref 98–111)
Creatinine, Ser: 6.71 mg/dL — ABNORMAL HIGH (ref 0.44–1.00)
GFR calc Af Amer: 6 mL/min — ABNORMAL LOW (ref >60)
GFR calc non Af Amer: 5 mL/min — ABNORMAL LOW (ref >60)
Glucose, Bld: 285 mg/dL — ABNORMAL HIGH (ref 70–99)
Potassium: 3.7 mmol/L (ref 3.5–5.1)
Sodium: 133 mmol/L — ABNORMAL LOW (ref 135–145)

## 2019-09-11 LAB — HEPARIN LEVEL (UNFRACTIONATED)
Heparin Unfractionated: <0.1 IU/mL — ABNORMAL LOW (ref 0.30–0.70)
Heparin Unfractionated: 0.24 IU/mL — ABNORMAL LOW (ref 0.30–0.70)

## 2019-09-11 LAB — HEMOGLOBIN A1C
Hgb A1c MFr Bld: 7.9 % — ABNORMAL HIGH (ref 4.8–5.6)
Mean Plasma Glucose: 180.03 mg/dL

## 2019-09-11 LAB — FERRITIN: Ferritin: 392 ng/mL — ABNORMAL HIGH (ref 11–307)

## 2019-09-11 LAB — MRSA PCR SCREENING: MRSA by PCR: NEGATIVE

## 2019-09-11 LAB — C-REACTIVE PROTEIN: CRP: 11.7 mg/dL — ABNORMAL HIGH (ref <1.0)

## 2019-09-11 LAB — FIBRIN DERIVATIVES D-DIMER (ARMC ONLY): Fibrin derivatives D-dimer (ARMC): 1318.07 ng/mL (FEU) — ABNORMAL HIGH (ref 0.00–499.00)

## 2019-09-11 SURGERY — RIGHT/LEFT HEART CATH AND CORONARY ANGIOGRAPHY
Anesthesia: Moderate Sedation

## 2019-09-11 MED ORDER — LORATADINE 10 MG PO TABS: 10.0000 mg | ORAL_TABLET | Freq: Every day | ORAL | Status: DC

## 2019-09-11 MED ORDER — METOPROLOL TARTRATE 12.5 MG HALF TABLET: 50.0000 mg | ORAL_TABLET | Freq: Two times a day (BID) | ORAL | Status: DC

## 2019-09-11 MED ORDER — INSULIN GLARGINE 100 UNIT/ML ~~LOC~~ SOLN: 45.0000 [IU] | Freq: Every day | SUBCUTANEOUS | Status: DC

## 2019-09-11 MED ORDER — NYSTATIN 100000 UNIT/GM EX POWD
1.0000 g | Freq: Every day | CUTANEOUS | Status: DC
  Filled 2019-09-11: qty 15

## 2019-09-11 MED ORDER — CALCIUM ACETATE (PHOS BINDER) 667 MG PO CAPS
1334.0000 mg | ORAL_CAPSULE | Freq: Three times a day (TID) | ORAL | Status: DC
  Administered 2019-09-11: 18:00:00 1334 mg via ORAL
  Filled 2019-09-11 (×2): qty 2

## 2019-09-11 MED ORDER — ONDANSETRON HCL 4 MG PO TABS
4.0000 mg | ORAL_TABLET | Freq: Four times a day (QID) | ORAL | Status: DC | PRN
  Administered 2019-09-30: 4 mg via ORAL
  Filled 2019-09-11: qty 1

## 2019-09-11 MED ORDER — ZINC SULFATE 220 (50 ZN) MG PO CAPS
220.0000 mg | ORAL_CAPSULE | Freq: Every day | ORAL | Status: DC
  Administered 2019-09-12 – 2019-10-02 (×20): 220 mg via ORAL
  Filled 2019-09-11 (×22): qty 1

## 2019-09-11 MED ORDER — PANTOPRAZOLE SODIUM 40 MG PO TBEC
40.0000 mg | DELAYED_RELEASE_TABLET | Freq: Every day | ORAL | Status: DC
  Administered 2019-09-12 – 2019-10-02 (×21): 40 mg via ORAL
  Filled 2019-09-11 (×21): qty 1

## 2019-09-11 MED ORDER — ALBUTEROL SULFATE HFA 108 (90 BASE) MCG/ACT IN AERS
2.0000 | INHALATION_SPRAY | Freq: Four times a day (QID) | RESPIRATORY_TRACT | Status: DC | PRN
  Filled 2019-09-11: qty 6.7

## 2019-09-11 MED ORDER — HYDRALAZINE HCL 50 MG PO TABS: 25.0000 mg | ORAL_TABLET | Freq: Three times a day (TID) | ORAL | Status: DC

## 2019-09-11 MED ORDER — MIRABEGRON ER 25 MG PO TB24
25.0000 mg | ORAL_TABLET | Freq: Every day | ORAL | Status: DC
  Administered 2019-09-12 – 2019-10-02 (×21): 25 mg via ORAL
  Filled 2019-09-11 (×21): qty 1

## 2019-09-11 MED ORDER — FLUTICASONE PROPIONATE 50 MCG/ACT NA SUSP: 1.0000 | Freq: Every day | NASAL | Status: DC

## 2019-09-11 MED ORDER — HEPARIN (PORCINE) 25000 UT/250ML-% IV SOLN
1500.0000 [IU]/h | INTRAVENOUS | Status: DC
  Administered 2019-09-11: 09:00:00 1350 [IU]/h via INTRAVENOUS
  Filled 2019-09-11: qty 250

## 2019-09-11 MED ORDER — SODIUM CHLORIDE 0.9% FLUSH
3.0000 mL | Freq: Two times a day (BID) | INTRAVENOUS | Status: DC
  Administered 2019-09-11 – 2019-10-01 (×29): 3 mL via INTRAVENOUS

## 2019-09-11 MED ORDER — HYDROCOD POLST-CPM POLST ER 10-8 MG/5ML PO SUER: 5.0000 mL | Freq: Two times a day (BID) | ORAL | Status: DC | PRN

## 2019-09-11 MED ORDER — OLANZAPINE 5 MG PO TABS
5.0000 mg | ORAL_TABLET | Freq: Every day | ORAL | Status: DC
  Administered 2019-09-12 – 2019-10-01 (×21): 5 mg via ORAL
  Filled 2019-09-11 (×22): qty 1

## 2019-09-11 MED ORDER — SENNA 8.6 MG PO TABS
1.0000 | ORAL_TABLET | Freq: Two times a day (BID) | ORAL | Status: DC
  Administered 2019-09-12 – 2019-10-02 (×39): 8.6 mg via ORAL
  Filled 2019-09-11 (×39): qty 1

## 2019-09-11 MED ORDER — ONDANSETRON HCL 4 MG/2ML IJ SOLN
4.0000 mg | Freq: Four times a day (QID) | INTRAMUSCULAR | Status: DC | PRN
  Administered 2019-09-12 – 2019-09-30 (×6): 4 mg via INTRAVENOUS
  Filled 2019-09-11 (×7): qty 2

## 2019-09-11 MED ORDER — VITAMIN B-12 1000 MCG PO TABS
1000.0000 ug | ORAL_TABLET | Freq: Every day | ORAL | Status: DC
  Administered 2019-09-11: 1000 ug via ORAL
  Filled 2019-09-11 (×2): qty 1

## 2019-09-11 MED ORDER — DEXAMETHASONE 6 MG PO TABS: 6.0000 mg | ORAL_TABLET | Freq: Every day | ORAL | Status: DC

## 2019-09-11 MED ORDER — ACETAMINOPHEN 325 MG PO TABS
650.0000 mg | ORAL_TABLET | Freq: Four times a day (QID) | ORAL | Status: DC | PRN
  Administered 2019-09-15 – 2019-09-23 (×8): 650 mg via ORAL
  Filled 2019-09-11 (×8): qty 2

## 2019-09-11 MED ORDER — CITALOPRAM HYDROBROMIDE 20 MG PO TABS
10.0000 mg | ORAL_TABLET | Freq: Every day | ORAL | Status: DC
  Administered 2019-09-12 – 2019-10-02 (×21): 10 mg via ORAL
  Filled 2019-09-11 (×21): qty 1

## 2019-09-11 MED ORDER — HEPARIN BOLUS VIA INFUSION
1500.0000 [IU] | Freq: Once | INTRAVENOUS | Status: AC
  Administered 2019-09-11: 18:00:00 1500 [IU] via INTRAVENOUS
  Filled 2019-09-11: qty 1500

## 2019-09-11 MED ORDER — LEVETIRACETAM 250 MG PO TABS
250.0000 mg | ORAL_TABLET | Freq: Two times a day (BID) | ORAL | Status: DC
  Administered 2019-09-12 – 2019-10-02 (×42): 250 mg via ORAL
  Filled 2019-09-11 (×46): qty 1

## 2019-09-11 MED ORDER — ASCORBIC ACID 500 MG PO TABS
500.0000 mg | ORAL_TABLET | Freq: Every day | ORAL | Status: DC
  Administered 2019-09-12 – 2019-10-02 (×20): 500 mg via ORAL
  Filled 2019-09-11 (×22): qty 1

## 2019-09-11 MED ORDER — LEVETIRACETAM 250 MG PO TABS
250.0000 mg | ORAL_TABLET | Freq: Two times a day (BID) | ORAL | Status: DC
  Administered 2019-09-11: 16:00:00 250 mg via ORAL
  Filled 2019-09-11 (×2): qty 1

## 2019-09-11 MED ORDER — ASPIRIN 81 MG PO CHEW
81.0000 mg | CHEWABLE_TABLET | Freq: Every day | ORAL | Status: DC
  Administered 2019-09-12 – 2019-09-16 (×5): 81 mg via ORAL
  Filled 2019-09-11 (×5): qty 1

## 2019-09-11 MED ORDER — MELATONIN 3 MG PO TABS
6.0000 mg | ORAL_TABLET | Freq: Every day | ORAL | Status: DC
  Filled 2019-09-11: qty 2

## 2019-09-11 MED ORDER — DOCUSATE SODIUM 100 MG PO CAPS
100.0000 mg | ORAL_CAPSULE | Freq: Two times a day (BID) | ORAL | Status: DC
  Administered 2019-09-12 – 2019-10-02 (×37): 100 mg via ORAL
  Filled 2019-09-11 (×40): qty 1

## 2019-09-11 MED ORDER — MIRABEGRON ER 25 MG PO TB24
25.0000 mg | ORAL_TABLET | Freq: Every day | ORAL | Status: DC
  Administered 2019-09-11: 16:00:00 25 mg via ORAL
  Filled 2019-09-11 (×2): qty 1

## 2019-09-11 MED ORDER — SODIUM CHLORIDE 0.9 % IV SOLN: 100.0000 mg | INTRAVENOUS | Status: DC

## 2019-09-11 MED ORDER — ATORVASTATIN CALCIUM 40 MG PO TABS
40.0000 mg | ORAL_TABLET | Freq: Every day | ORAL | Status: DC
  Administered 2019-09-12 – 2019-10-02 (×20): 40 mg via ORAL
  Filled 2019-09-11 (×21): qty 1

## 2019-09-11 MED ORDER — INSULIN GLARGINE 100 UNIT/ML ~~LOC~~ SOLN
45.0000 [IU] | Freq: Every day | SUBCUTANEOUS | Status: DC
  Filled 2019-09-11: qty 0.45

## 2019-09-11 MED ORDER — GUAIFENESIN-DM 100-10 MG/5ML PO SYRP
10.0000 mL | ORAL_SOLUTION | ORAL | Status: DC | PRN
  Filled 2019-09-11: qty 10

## 2019-09-11 MED ORDER — TORSEMIDE 20 MG PO TABS
40.0000 mg | ORAL_TABLET | ORAL | Status: DC
  Administered 2019-09-13 – 2019-10-02 (×12): 40 mg via ORAL
  Filled 2019-09-11 (×12): qty 2

## 2019-09-11 MED ORDER — LAMOTRIGINE 25 MG PO TABS
25.0000 mg | ORAL_TABLET | Freq: Every day | ORAL | Status: DC
  Administered 2019-09-12 – 2019-10-02 (×21): 25 mg via ORAL
  Filled 2019-09-11 (×21): qty 1

## 2019-09-11 MED ORDER — OLANZAPINE 5 MG PO TABS: 5.0000 mg | ORAL_TABLET | Freq: Every day | ORAL | Status: DC

## 2019-09-11 MED ORDER — LOSARTAN POTASSIUM 50 MG PO TABS: 50.0000 mg | ORAL_TABLET | Freq: Every day | ORAL | Status: DC

## 2019-09-11 MED ORDER — POLYSACCHARIDE IRON COMPLEX 150 MG PO CAPS
150.0000 mg | ORAL_CAPSULE | Freq: Every day | ORAL | Status: DC
  Administered 2019-09-11: 150 mg via ORAL
  Filled 2019-09-11 (×2): qty 1

## 2019-09-11 MED ORDER — METOPROLOL TARTRATE 12.5 MG HALF TABLET
25.0000 mg | ORAL_TABLET | Freq: Four times a day (QID) | ORAL | Status: DC
  Administered 2019-09-11 – 2019-09-13 (×5): 25 mg via ORAL
  Filled 2019-09-11 (×7): qty 2

## 2019-09-11 MED ORDER — HEPARIN (PORCINE) 25000 UT/250ML-% IV SOLN: 1350.0000 [IU]/h | INTRAVENOUS | Status: DC

## 2019-09-11 MED ORDER — LAMOTRIGINE 25 MG PO TABS: 25.0000 mg | ORAL_TABLET | Freq: Every day | ORAL | Status: DC

## 2019-09-11 MED ORDER — CLONAZEPAM 0.25 MG PO TBDP
0.2500 mg | ORAL_TABLET | Freq: Two times a day (BID) | ORAL | Status: DC | PRN
  Administered 2019-09-16 – 2019-09-28 (×7): 0.25 mg via ORAL
  Filled 2019-09-11 (×7): qty 1

## 2019-09-11 MED ORDER — SODIUM CHLORIDE 0.9 % IV SOLN
200.0000 mg | Freq: Once | INTRAVENOUS | Status: DC
  Filled 2019-09-11: qty 40

## 2019-09-11 MED ORDER — CITALOPRAM HYDROBROMIDE 20 MG PO TABS
10.0000 mg | ORAL_TABLET | Freq: Every day | ORAL | Status: DC
  Administered 2019-09-11: 10 mg via ORAL
  Filled 2019-09-11: qty 1

## 2019-09-11 MED ORDER — POLYETHYLENE GLYCOL 3350 17 G PO PACK
17.0000 g | PACK | Freq: Every day | ORAL | Status: DC | PRN
  Administered 2019-09-18 – 2019-09-21 (×2): 17 g via ORAL
  Filled 2019-09-11 (×2): qty 1

## 2019-09-11 MED ORDER — CALCIUM ACETATE (PHOS BINDER) 667 MG PO CAPS
1334.0000 mg | ORAL_CAPSULE | Freq: Three times a day (TID) | ORAL | Status: DC
  Administered 2019-09-12 – 2019-10-02 (×49): 1334 mg via ORAL
  Filled 2019-09-11 (×58): qty 2

## 2019-09-11 MED ORDER — TIZANIDINE HCL 2 MG PO TABS
2.0000 mg | ORAL_TABLET | Freq: Three times a day (TID) | ORAL | Status: DC | PRN
  Filled 2019-09-11: qty 1

## 2019-09-11 MED ORDER — OXYCODONE HCL 5 MG PO TABS
5.0000 mg | ORAL_TABLET | ORAL | Status: DC | PRN
  Administered 2019-09-15 – 2019-09-28 (×19): 5 mg via ORAL
  Filled 2019-09-11 (×19): qty 1

## 2019-09-11 MED ORDER — INSULIN GLARGINE 100 UNIT/ML ~~LOC~~ SOLN
25.0000 [IU] | Freq: Every day | SUBCUTANEOUS | Status: DC
  Administered 2019-09-12 – 2019-09-13 (×2): 25 [IU] via SUBCUTANEOUS
  Filled 2019-09-11 (×3): qty 0.25

## 2019-09-11 MED ORDER — HEPARIN (PORCINE) 25000 UT/250ML-% IV SOLN
1650.0000 [IU]/h | INTRAVENOUS | Status: DC
  Administered 2019-09-11: 1500 [IU]/h via INTRAVENOUS
  Administered 2019-09-12: 1950 [IU]/h via INTRAVENOUS
  Administered 2019-09-12: 1700 [IU]/h via INTRAVENOUS
  Administered 2019-09-13 – 2019-09-16 (×5): 1950 [IU]/h via INTRAVENOUS
  Administered 2019-09-16 – 2019-09-20 (×6): 1650 [IU]/h via INTRAVENOUS
  Filled 2019-09-11 (×14): qty 250

## 2019-09-11 MED ORDER — ASPIRIN 81 MG PO CHEW: 81.0000 mg | CHEWABLE_TABLET | Freq: Every day | ORAL | Status: AC

## 2019-09-11 MED ORDER — HEPARIN BOLUS VIA INFUSION
2650.0000 [IU] | Freq: Once | INTRAVENOUS | Status: AC
  Administered 2019-09-11: 2650 [IU] via INTRAVENOUS
  Filled 2019-09-11: qty 2650

## 2019-09-11 MED ORDER — INSULIN ASPART 100 UNIT/ML ~~LOC~~ SOLN
0.0000 [IU] | SUBCUTANEOUS | Status: DC
  Administered 2019-09-11 – 2019-09-12 (×2): 2 [IU] via SUBCUTANEOUS
  Administered 2019-09-12: 1 [IU] via SUBCUTANEOUS
  Administered 2019-09-12 (×2): 2 [IU] via SUBCUTANEOUS
  Administered 2019-09-12: 1 [IU] via SUBCUTANEOUS
  Administered 2019-09-13: 2 [IU] via SUBCUTANEOUS
  Administered 2019-09-13: 5 [IU] via SUBCUTANEOUS
  Administered 2019-09-13: 2 [IU] via SUBCUTANEOUS
  Administered 2019-09-13: 9 [IU] via SUBCUTANEOUS
  Administered 2019-09-13: 2 [IU] via SUBCUTANEOUS
  Administered 2019-09-13: 7 [IU] via SUBCUTANEOUS

## 2019-09-11 NOTE — Progress Notes (Signed)
Established patient known at [facility] MWF 11:00. Patient tested positive for COVID 11/4 and was receiving treatments on the COVID shift at [facility] for two weeks and was returned back to home clinic. Patient will return back to home clinic upon discharge.  Please contact me with any dialysis placement concerns.  Elvera Bicker Dialysis Coordinator [phone]

## 2019-09-11 NOTE — H&P (Signed)
History and Physical    [name] KPT:[id number] DOB: 1941-09-22 DOA: 09/11/2019  PCP: System, Provider Not In Patient coming from: [place] have personally briefly reviewed patient's old medical records in [place]  Chief Complaint: Covid 19 infection, Hypotension, palpitations  HPI: Carolyn Valdez is a 78 y.o. 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 She initially presented to the ED from dialysis where she was hypotensive and tachycardic, and her dialysis session had to be stopped early. She was diagnosed with Covid on 11/4 at her ALF. She did not have severe symptoms and remained out of the hospital. Symptoms have not worsened significantly at the time of presentation. She endorses cough with sputum production and some shortness of breath, but she has not required supplemental oxygen above her baseline and has been afebrile since admission. She resides at [facility]. She denies chest pain, but does endorse nausea, vomiting and vague abdominal discomfort. Denies fever, chills, diarrhea, melena, hematochezia, dysuria, hematuria.  At the time of presentation to [facility], she had a troponin elevation to 2336, global ST depressions and 2:1 atrial flutter on her EKG. She was also in [place] with RVR. Cardiology was consulted for NSTEMI and Aflutter; they recommendation initiation of heparin infusion and beta blockade. They are awaiting formal TTE prior to deciding on whether to pursue cardiac catheterization. Although she was initially hypotensive in the [facility] ED, she has subsequently stabilized and her blood pressure has been consistently normotensive.  Review of Systems: As per HPI  otherwise 10 point review of systems negative.   Past Medical History:  Diagnosis Date   (HFpEF) heart failure with preserved ejection fraction ([place])    a. 06/2018 Echo: EF 60-65%, no rwma. Gr2 DD. Mild LVH. Ao sclerosis w/o stenosis. Mild MR. Mild to mod LAE. Nl RV fxn. Mild RAE. Mod TR. PASP 60-61mmHg.   Anxiety    Breast cancer ([place]) 1997   a. 1997 s/p right breast mastectomy   Chest pain    a. 11/2008 MV: No ischemia. EF 66%.   CKD (chronic kidney disease) stage 4, GFR 15-29 ml/min (HCC)    Coronary artery calcification seen on CT scan    a. 01/2017 Chest CT: Atherosclerotic coronary Ca2+.   Diabetes mellitus without complication (HCC)    DJD (degenerative joint disease)    Dysuria    GIB (gastrointestinal bleeding)    a. 07/2018 EGD: Gastritis w/ hemorrhage, duodenal angioectasia. Friable gastric mucosa w/ bleeding on contact. All treated w/ argon plasma coagulation; b. 07/2018 s/p 2u PRBCs.   H/O total knee replacement    right   HTN (hypertension)    Hypercholesteremia    Kidney stone    Obesity    Recurrent urinary tract infection    SVT (supraventricular tachycardia) (HCC)    Urinary incontinence    Vaginal atrophy    Yeast vaginitis     Past Surgical History:  Procedure Laterality Date   CARDIAC CATHETERIZATION     CHOLECYSTECTOMY     COLONOSCOPY N/A 08/06/2018   Procedure: COLONOSCOPY;  Surgeon: Virgel Manifold, MD;  Location: [facility] ENDOSCOPY;  Service: Endoscopy;  Laterality: N/A;   DIALYSIS/PERMA CATHETER INSERTION N/A 03/16/2019   Procedure: DIALYSIS/PERMA CATHETER INSERTION;  Surgeon: Algernon Huxley, MD;  Location: [place] CV LAB;  Service: Cardiovascular;  Laterality: N/A;   DIALYSIS/PERMA CATHETER INSERTION N/A 05/29/2019   Procedure: DIALYSIS/PERMA CATHETER INSERTION;  Surgeon: Katha Cabal, MD;  Location: [place] CV LAB;  Service: Cardiovascular;  Laterality: N/A;   ESOPHAGOGASTRODUODENOSCOPY N/A 08/06/2018    Procedure: ESOPHAGOGASTRODUODENOSCOPY (EGD);  Surgeon: Virgel Manifold, MD;  Location: [facility] ENDOSCOPY;  Service: Endoscopy;  Laterality: N/A;   FOOT SURGERY     JOINT REPLACEMENT     MASTECTOMY Right 1997   NASAL SEPTUM SURGERY     PERIPHERAL VASCULAR CATHETERIZATION N/A 07/08/2015   Procedure: PICC Line Insertion;  Surgeon: Algernon Huxley, MD;  Location: [place] CV LAB;  Service: Cardiovascular;  Laterality: N/A;   TEMPORARY DIALYSIS CATHETER N/A 03/09/2019   Procedure: TEMPORARY DIALYSIS CATHETER INSERTION;  Surgeon: Algernon Huxley, MD;  Location: [place] CV LAB;  Service: Cardiovascular;  Laterality: N/A;   TONSILLECTOMY     Vocal cords       reports that she has quit smoking. She quit smokeless tobacco use about 23 years ago. She reports that she does not drink alcohol or use drugs.  Allergies  Allergen Reactions   Sulfa Antibiotics Hives   Biaxin [Clarithromycin] Hives   Buspar [Buspirone]    Influenza A (H1n1) Monoval Vac Other (See Comments)    Pt states that she was told by her MD not to get the influenza vaccine.     Morphine Other (See Comments)    Reaction:  Dizziness and confusion    Pyridium [Phenazopyridine Hcl] Other (See Comments)    Reaction:  Unknown    Tuberculin Tests    Ceftriaxone Anxiety   Latex Rash   Prednisone Rash   Tape Rash    Family History  Problem Relation Age of Onset   Lung cancer Father    Hematuria Mother    Lung cancer Mother    Kidney disease Neg Hx    Bladder Cancer Neg Hx    Breast cancer Neg Hx     Prior to Admission medications   Medication Sig Start Date End Date Taking? Authorizing Provider  acetaminophen (TYLENOL) 325 MG tablet Take 2 tablets (650 mg total) by mouth every 6 (six) hours as needed for mild pain (or Fever >/= 101). 04/26/18   Loletha Grayer, MD  albuterol (PROVENTIL HFA;VENTOLIN HFA) 108 (90 Base) MCG/ACT inhaler Inhale 2 puffs into the lungs every 6 (six) hours as needed for  wheezing or shortness of breath. 07/14/18   Gouru, Illene Silver, MD  amoxicillin (AMOXIL) 500 MG capsule Take 2,000 mg by mouth as directed. Prior to dental procedure    [provider]  aspirin 81 MG chewable tablet Chew 1 tablet (81 mg total) by mouth daily. 09/11/19   Lavina Hamman, MD  atorvastatin (LIPITOR) 40 MG tablet Take 1 tablet (40 mg total) by mouth daily at 6 PM. Patient taking differently: Take 40 mg by mouth at bedtime.  03/19/19   Dustin Flock, MD  calcium acetate (PHOSLO) 667 MG capsule Take 1,334 mg by mouth 3 (three) times daily before meals.    [provider]  camphor-menthol (MEN-PHOR) lotion Apply 1 application topically every 6 (six) hours as needed for itching.    [provider]  citalopram (CELEXA) 10 MG tablet Take 1 tablet (10 mg total) by mouth daily. 02/04/17   Loletha Grayer, MD  clonazePAM (KLONOPIN) 0.25 MG disintegrating tablet Take 1 tablet (0.25 mg total) by  mouth every 12 (twelve) hours as needed (anxiety). 03/19/19   Dustin Flock, MD  docusate sodium (COLACE) 100 MG capsule Take 100 mg by mouth 2 (two) times daily.     [provider]  ferric citrate (AURYXIA) 1 GM 210 MG(Fe) tablet Take 420 mg by mouth 3 (three) times daily with meals.     [provider]  fluticasone (FLONASE) 50 MCG/ACT nasal spray Place 1 spray into both nostrils daily.     [provider]  glipiZIDE (GLUCOTROL) 5 MG tablet Take 5 mg by mouth daily before breakfast.    [provider]  heparin 25000-0.45 UT/250ML-% infusion Inject 1,350 Units/hr into the vein continuous. 09/11/19   Lavina Hamman, MD  hydrALAZINE (APRESOLINE) 25 MG tablet Take 1 tablet (25 mg total) by mouth 3 (three) times daily with meals. 03/19/19   Dustin Flock, MD  insulin aspart (NOVOLOG) 100 UNIT/ML injection 3 (three) times daily before meals. Inject per sliding scale 0-199=0, 200-500=5 units, notify MD for blood sugar> or equal to 500    [provider]  insulin glargine (LANTUS) 100 UNIT/ML injection Inject 0.4 mLs (40 Units total) into the skin daily. Patient taking differently: Inject 45 Units into the skin at bedtime.  03/19/19   Dustin Flock, MD  iron polysaccharides (NIFEREX) 150 MG capsule Take 150 mg by mouth daily.    [provider]  lamoTRIgine (LAMICTAL) 25 MG tablet Take 1 tablet (25 mg total) by mouth daily. 04/26/18   Loletha Grayer, MD  levETIRAcetam (KEPPRA) 250 MG tablet Take 1 tablet (250 mg total) by mouth 2 (two) times daily. 10/26/16   Epifanio Lesches, MD  loratadine (CLARITIN) 10 MG tablet Take 10 mg by mouth daily.    [provider]  losartan (COZAAR) 50 MG tablet Take 50 mg by mouth daily.    [provider]  Melatonin 3 MG TABS Take 6 mg by mouth at bedtime.    [provider]  Meth-Hyo-M Bl-Na Phos-Ph Sal (URIBEL) 118 MG CAPS Take 118 mg by mouth every 6 (six) hours as needed (uti symptoms).    [provider]  metoprolol tartrate (LOPRESSOR) 50 MG tablet Take 50 mg by mouth 2 (two) times daily.    [provider]  mirabegron ER (MYRBETRIQ) 25 MG TB24 tablet Take 25 mg by mouth daily.    [provider]  Multiple Vitamin (MULTIVITAMIN) tablet Take 1 tablet by mouth daily.    [provider]  nystatin (NYSTATIN) powder Apply 1 g topically See admin instructions. Apply to under breast and abdominal once daily & apply every 12 hours as needed for abdominal folds, groin, and under breast    [provider]  OLANZapine (ZYPREXA) 5 MG tablet Take 5 mg by mouth at bedtime.    [provider]  ondansetron (ZOFRAN-ODT) 8 MG disintegrating tablet Take 8 mg by mouth every 8 (eight) hours as needed for nausea/vomiting. 08/02/18   [provider]  oxyCODONE-acetaminophen (PERCOCET/ROXICET) 5-325 MG tablet Take 1 tablet by mouth every 6 (six) hours as needed for moderate pain or severe pain. Patient not taking:  Reported on 09/10/2019 03/19/19   Dustin Flock, MD  pantoprazole (PROTONIX) 40 MG tablet Take 40 mg by mouth daily.     [provider]  polyethylene glycol (MIRALAX / GLYCOLAX) packet Take 17 g by mouth daily. Patient taking differently: Take 17 g by mouth as needed.  04/26/18   Loletha Grayer, MD  saccharomyces boulardii (FLORASTOR) 250  MG capsule Take 250 mg by mouth daily.    [provider]  sennosides-docusate sodium (SENOKOT-S) 8.6-50 MG tablet Take 1 tablet by mouth at bedtime.    [provider]  tiZANidine (ZANAFLEX) 2 MG tablet Take 2 mg by mouth every 8 (eight) hours as needed for muscle spasms.    [provider]  torsemide (DEMADEX) 20 MG tablet Take 2 tablets (40 mg total) by mouth daily. Patient taking differently: Take 20 mg by mouth daily.  07/15/18   Nicholes Mango, MD  vitamin B-12 (CYANOCOBALAMIN) 1000 MCG tablet Take 1,000 mcg by mouth daily.    [provider]    Physical Exam: Vitals:   09/11/19 2048 09/11/19 2100 09/11/19 2200  BP:  (!) 144/77 (!) 148/71  Pulse:  (!) 126 (!) 126  Resp:  (!) 22 20  SpO2:  98% 98%  Weight: 124.4 kg    Height: 5\' 7"  (1.702 m)      Constitutional: NAD, calm, comfortable Eyes: PERRL, lids and conjunctivae normal ENMT: Mucous membranes are moist. Posterior pharynx clear of any exudate or lesions.  Neck: normal, supple, no masses Respiratory: clear to auscultation bilaterally, no wheezing, no crackles. Normal respiratory effort. Cardiovascular: Tachycardic, no murmurs / rubs / gallops. 1+ lower extremity edema. 2+ pedal pulses. Abdomen: no tenderness, no masses palpated. Bowel sounds positive.  Musculoskeletal: no clubbing / cyanosis. No joint deformity upper and lower extremities. Good ROM. Skin: no rashes, lesions, ulcers. No induration Neurologic: CN 2-12 grossly intact. Strength 5/5 in all 4.  Psychiatric: Normal judgment and insight. Alert and oriented x 3. Normal mood.    Labs on  Admission: I have personally reviewed following labs and imaging studies  CBC: Recent Labs  Lab 09/10/19 1329 09/11/19 0645  WBC 12.6* 13.3*  HGB 10.5* 9.1*  HCT 34.2* 29.2*  MCV 90.2 89.0  PLT 462* 765   Basic Metabolic Panel: Recent Labs  Lab 09/10/19 1329 09/11/19 0645  NA 135 133*  K 3.7 3.7  CL 94* 96*  CO2 26 26  GLUCOSE 281* 285*  BUN 26* 34*  CREATININE 5.81* 6.71*  CALCIUM 9.0 8.3*  PHOS 2.8  --    GFR: Estimated Creatinine Clearance: 9.8 mL/min (A) (by C-G formula based on SCr of 6.71 mg/dL (H)). Liver Function Tests: No results for input(s): AST, ALT, ALKPHOS, BILITOT, PROT, ALBUMIN in the last 168 hours. No results for input(s): LIPASE, AMYLASE in the last 168 hours. No results for input(s): AMMONIA in the last 168 hours. Coagulation Profile: Recent Labs  Lab 09/10/19 1515  INR 1.1   Cardiac Enzymes: No results for input(s): CKTOTAL, CKMB, CKMBINDEX, TROPONINI in the last 168 hours. BNP (last 3 results) No results for input(s): PROBNP in the last 8760 hours. HbA1C: Recent Labs    09/10/19 2226  HGBA1C 7.9*   CBG: Recent Labs  Lab 09/10/19 2228 09/11/19 0926 09/11/19 1748 09/11/19 2050  GLUCAP 228* 256* 184* 205*   Lipid Profile: Recent Labs    09/10/19 1329  CHOL 129  HDL 30*  LDLCALC 47  TRIG 261*  CHOLHDL 4.3   Thyroid Function Tests: No results for input(s): TSH, T4TOTAL, FREET4, T3FREE, THYROIDAB in the last 72 hours. Anemia Panel: Recent Labs    09/11/19 1627  FERRITIN 392*   Urine analysis:    Component Value Date/Time   COLORURINE AMBER (A) 07/03/2019 0600   APPEARANCEUR CLOUDY (A) 07/03/2019 0600   APPEARANCEUR Cloudy (A) 01/16/2018 1349   LABSPEC 1.020 07/03/2019 0600  LABSPEC 1.008 09/08/2014 1719   PHURINE 5.0 07/03/2019 0600   GLUCOSEU NEGATIVE 07/03/2019 0600   GLUCOSEU Negative 09/08/2014 1719   HGBUR NEGATIVE 07/03/2019 0600   BILIRUBINUR NEGATIVE 07/03/2019 0600   BILIRUBINUR Negative 01/16/2018  1349   BILIRUBINUR Negative 09/08/2014 1719   KETONESUR NEGATIVE 07/03/2019 0600   PROTEINUR 100 (A) 07/03/2019 0600   NITRITE NEGATIVE 07/03/2019 0600   LEUKOCYTESUR LARGE (A) 07/03/2019 0600   LEUKOCYTESUR 3+ 09/08/2014 1719    Radiological Exams on Admission: Dg Chest Port 1 View  Result Date: 09/10/2019 CLINICAL DATA:  78 year old female with shortness of breath. EXAM: PORTABLE CHEST 1 VIEW COMPARISON:  Chest radiograph dated 07/02/2019. FINDINGS: Right-sided dialysis catheter in similar position. There is stable cardiomegaly with mild vascular congestion. No edema. No focal consolidation or pneumothorax. Right apical subpleural crescentic density may represent scarring or trace loculated effusion. There is atherosclerotic calcification of the aortic arch. No acute osseous pathology. IMPRESSION: 1. Stable cardiomegaly with mild vascular congestion. No focal consolidation or pneumothorax. 2. Right apical subpleural crescentic density may represent trace pleural effusion. Electronically Signed   By: Anner Crete M.D.   On: 09/10/2019 13:48   Dg Abd Portable 1v  Result Date: 09/11/2019 CLINICAL DATA:  Abdominal pain and a COVID-19 positive patient. EXAM: PORTABLE ABDOMEN - 1 VIEW COMPARISON:  None. FINDINGS: The bowel gas pattern is normal. No radio-opaque calculi or other significant radiographic abnormality are seen. IMPRESSION: Negative exam. Electronically Signed   By: Inge Rise M.D.   On: 09/11/2019 09:53    EKG: Independently reviewed. 2:1 atrial flutter, global ST depressions.  Assessment/Plan Active Problems:   Respiratory tract infection due to COVID-19 virus   MAISON AGRUSA is a 78 y.o. female with above medical history who presents with NSTEMI and atrial flutter in the setting of recent Covid-19 infection. She does not have significant respiratory symptoms on admission. She is on her baseline O2 requirement of 3L with mild cough not significantly above her baseline.  There were apparently issues with initiating HD at [facility] and she was subsequently transferred to [facility] for further management. There is no indication for targeted therapy for Covid at this time. Will continue treatment and workup for ACS and atrial flutter. Ensure that patient is dialyzed per her usual schedule tomorrow.  NSTEMI HFpEF -Troponin peak 2336 with EKG changes -Cardiology consulted at [place], ensure that they are still following -Obtain formal TTE -Repeat EKG on arrival to assess for resolution of EKG changes -Continue heparin drip -ASA, statin, beta blockade -Continue Lasix on non-HD days -Risk stratification labs  Atrial flutter with rapid ventricular response -Ensure Cardiology following as above -Heparin infusion as above -Continue beta blockade, goal HR <120  ESRD on MWF HD -Reengage Nephrology in AM -Continue HD per MWF schedule -Avoid nephrotoxins -Strict I/O, daily weights  Covid 19 infection, subsequent encounter -Unclear what role Covid is playing in her current symptoms -Would give consideration to VQ scan to assess for PE if TTE shows worsening right heart failure -No indication for remdesivir, steroids, antibiotics -Anticoagulation as above -Continue airborne precautions  HTN -Holding antihypertensive meds other than metoprolol for now given hypotension at dialysis -Restart home BP meds as able  DVT prophylaxis: Heparin Code Status: DNR Disposition Plan: Home in 2-3 days Consults called: None Admission status: Progressive   Bennie Pierini MD [facility]  If 7PM-7AM, please contact night-coverage [email or web address] Password [facility]  09/11/2019, 10:43 PM

## 2019-09-11 NOTE — Discharge Summary (Signed)
[facility] Discharge Summary   Patient: Carolyn Valdez PYK:[id number]   PCP: System, Provider Not In DOB: 01-28-1941   Date of admission: 09/10/2019   Date of discharge:  09/11/2019    Discharge Diagnoses:   Principal Problem:   NSTEMI (non-ST elevated myocardial infarction) [facility]) Active Problems:   Diabetes mellitus, type 2 ([place])   Hypertension   Lymphedema   Anemia associated with chronic renal failure   Chronic diastolic CHF (congestive heart failure) ([place])   Dizziness   ESRD on dialysis ([place])   Atrial flutter, paroxysmal (HCC)   Elevated troponin   Dyspnea   GERD (gastroesophageal reflux disease)   Degenerative joint disease   Pulmonary hypertension ([place])   Hypotension   Lab test positive for detection of COVID-19 virus   Atrial flutter ([place])   Admitted From: home Disposition:  [facility] progressive care  Recommendations for Outpatient Follow-up:  Based on d/c recommendation from [facility] Follow up on 10/04/2019.   Specialty: Cardiology Why: at 2:00pm. Enter through the [place] entrance Contact information: [place] [phone]         Diet recommendation: Cardiac diet  Activity: The patient is advised to gradually reintroduce usual activities,as tolerate.  Discharge Condition: good  Code Status: DNR   History of present illness: As per the H and P dictated on admission, "TEANA Valdez is a 78 y.o. female with medical history significant of ESRD on dialysis, chronic diastolic CHF, pulmonary hypertension, type 2 diabetes, GI bleed in Oct 2019, HTN, HLD, DJD, HFpEF, breast cancer, chronic lymphedema, obesity  anxiety/depression.  Chronically on 3L/min O2.  She presented to the ED today from dialysis with dyspnea and dizziness.  She was only able to have 45 minutes of dialysis and EMS was  called.  Patient reports having SOB and dizziness for past several days, states gasping for air at night as well, also past few days.  She usually sleeps with head of bed elevated or in her recliner at home, does not lay flat.  Had a single brief episode of chest pain that radiated down her left arm a couple of nights ago, resolved spontaneously, has not recurred.  Also reports intermittent nausea/vomiting, chronically has spells of nausea, but lately somewhat worse.  Reports diarrhea, has history of IBS but usually more constipated.  Denies fever/chills, cough.  Endorses poor appetite, headache that feels like tension.  "  Hospital Course:  Summary of her active problems in the hospital is as following. Principal Problem:   NSTEMI (non-ST elevated myocardial infarction) [facility])   Hypertension Cardiology consulted, think this is demand ischemia or subacute ischemia  For now not able to lie flat so not a candidate for Cardiac Catheterization, although my change once HD is finished and volume removed.   Continue heparin infusion, aspirin 81 mg daily. Follow-up echocardiogram    Acute on Chronic diastolic CHF (congestive heart failure) ([place]) Volume management per Nephrology May not tolerate due to hypotension and may need CVVHD    Atrial flutter, paroxysmal (HCC) with RVR 2-1 AV block and ventricular rates around 125 bpm. Cardiology consulted.  Continue metoprolol tartrate 12.5 mg every 6 hours, as blood pressure tolerates.   If blood pressure drops, we may need to consider amiodarone infusion for heart rate control, Continue heparin IV may need TEE/DC cardioversion if rate control remains difficult.  However, in the setting of COVID-19 infection, Cardiology would like to avoid this if at all possible, as the risk for recurrent flutter is high and as such benefit is short lasting with higher risk.   Pulmonary hypertension ([place]) conitnue home meds  Acute COVID-19 Viral illness Lab Results   Component Value Date   SARSCOV2NAA POSITIVE (A) 09/10/2019   [place] NEGATIVE 07/04/2019   [place] NEGATIVE 07/02/2019   [place] NEGATIVE 05/29/2019   CXR: hazy bilateral peripheral opacities Tmax last 24 hours:  Temp (24hrs), Avg:100.1 F (37.8 C), Min:100.1 F (37.8 C), Max:100.1 F (37.8 C)  Oxygen requirements: 2LPM  Antibiotics:none Diuretics: on HD Vitamin C and Zinc: started on 09/11/2019 DVT Prophylaxis: Therapeutic Anticoagulation with heparin  Remdesivir: d/w ID, for now since the pt is 20 days from initial covid test will hold it. Steroids and Actemra: see above  Prone positioning: Patient encouraged to stay in prone position as much as possible.  PPE During this encounter: Patient Isolation: Airborne + Droplet + Contact  MAINTAIN ISOLATION SINCE THE PT WAS NEVER TREATED FOR COVID AND HAS MILD SYMPTOMS AS WELL AS LOW GRADE TEMP  HCP PPE: CAPR, gown. gloves Patient PPE: None  The treatment plan and use of medications and known side effects were discussed with patient/family. It was clearly explained that there is no proven definitive treatment for COVID-19 infection yet. Any medications used here are based on case reports/anecdotal data which are not peer-reviewed and has not been studied using randomized control trials.  Complete risks and long-term side effects are unknown, however in the best clinical judgment they seem to be of some clinical benefit rather than medical risks.  Patient/family agree with the treatment plan and want to receive these treatments as indicated.    Diabetes mellitus, type 2 ([place]) uncontrolled with hyperglycemia  Add lantus continue sliding scale.    Anemia associated with chronic renal failure Stable Management per Nephrology   ESRD on dialysis [facility]) MWF Pt will need to go to [facility] for continuation of HD while inpatient  Morbid Obesity  Body mass index is 40.72 kg/m.  Dietary consultation   Consultants: Cardiology  Nephrology Procedures: HD  DISCHARGE MEDICATION: Allergies as of 09/11/2019      Reactions   Sulfa Antibiotics Hives   Biaxin [clarithromycin] Hives   Buspar [buspirone]    Influenza A (h1n1) Monoval Vac Other (See Comments)   Pt states that she was told by her MD not to get the influenza vaccine.     Morphine Other (See Comments)   Reaction:  Dizziness and confusion    Pyridium [phenazopyridine Hcl] Other (See Comments)   Reaction:  Unknown    Tuberculin Tests    Ceftriaxone Anxiety   Latex Rash   Prednisone Rash   Tape Rash      Medication List    TAKE these medications   acetaminophen 325 MG tablet Commonly known as: TYLENOL Take 2 tablets (650 mg total) by mouth every 6 (six) hours as needed for mild pain (or Fever >/= 101).   albuterol 108 (90 Base) MCG/ACT inhaler Commonly known as: VENTOLIN HFA Inhale 2 puffs into the lungs every 6 (six) hours as needed for wheezing or shortness of breath.   amoxicillin 500 MG capsule Commonly known as: AMOXIL Take 2,000 mg by mouth as directed. Prior to dental procedure   aspirin 81 MG chewable tablet Chew 1 tablet (81 mg total) by mouth daily.   atorvastatin 40 MG tablet Commonly known as: LIPITOR Take  1 tablet (40 mg total) by mouth daily at 6 PM. What changed: when to take this   Auryxia 1 GM 210 MG(Fe) tablet Generic drug: ferric citrate Take 420 mg by mouth 3 (three) times daily with meals.   calcium acetate 667 MG capsule Commonly known as: PHOSLO Take 1,334 mg by mouth 3 (three) times daily before meals.   citalopram 10 MG tablet Commonly known as: CELEXA Take 1 tablet (10 mg total) by mouth daily.   clonazePAM 0.25 MG disintegrating tablet Commonly known as: KLONOPIN Take 1 tablet (0.25 mg total) by mouth every 12 (twelve) hours as needed (anxiety).   docusate sodium 100 MG capsule Commonly known as: COLACE Take 100 mg by mouth 2 (two) times daily.   fluticasone 50 MCG/ACT nasal spray Commonly known  as: FLONASE Place 1 spray into both nostrils daily.   glipiZIDE 5 MG tablet Commonly known as: GLUCOTROL Take 5 mg by mouth daily before breakfast.   heparin 25000-0.45 UT/250ML-% infusion Inject 1,350 Units/hr into the vein continuous.   hydrALAZINE 25 MG tablet Commonly known as: APRESOLINE Take 1 tablet (25 mg total) by mouth 3 (three) times daily with meals.   insulin aspart 100 UNIT/ML injection Commonly known as: novoLOG 3 (three) times daily before meals. Inject per sliding scale 0-199=0, 200-500=5 units, notify MD for blood sugar> or equal to 500   insulin glargine 100 UNIT/ML injection Commonly known as: LANTUS Inject 0.4 mLs (40 Units total) into the skin daily. What changed:   how much to take  when to take this   iron polysaccharides 150 MG capsule Commonly known as: NIFEREX Take 150 mg by mouth daily.   lamoTRIgine 25 MG tablet Commonly known as: LAMICTAL Take 1 tablet (25 mg total) by mouth daily.   levETIRAcetam 250 MG tablet Commonly known as: KEPPRA Take 1 tablet (250 mg total) by mouth 2 (two) times daily.   loratadine 10 MG tablet Commonly known as: CLARITIN Take 10 mg by mouth daily.   losartan 50 MG tablet Commonly known as: COZAAR Take 50 mg by mouth daily.   Melatonin 3 MG Tabs Take 6 mg by mouth at bedtime.   Men-Phor lotion Generic drug: camphor-menthol Apply 1 application topically every 6 (six) hours as needed for itching.   metoprolol tartrate 50 MG tablet Commonly known as: LOPRESSOR Take 50 mg by mouth 2 (two) times daily.   multivitamin tablet Take 1 tablet by mouth daily.   Myrbetriq 25 MG Tb24 tablet Generic drug: mirabegron ER Take 25 mg by mouth daily.   nystatin powder Generic drug: nystatin Apply 1 g topically See admin instructions. Apply to under breast and abdominal once daily & apply every 12 hours as needed for abdominal folds, groin, and under breast   OLANZapine 5 MG tablet Commonly known as: ZYPREXA  Take 5 mg by mouth at bedtime.   ondansetron 8 MG disintegrating tablet Commonly known as: ZOFRAN-ODT Take 8 mg by mouth every 8 (eight) hours as needed for nausea/vomiting.   oxyCODONE-acetaminophen 5-325 MG tablet Commonly known as: PERCOCET/ROXICET Take 1 tablet by mouth every 6 (six) hours as needed for moderate pain or severe pain.   pantoprazole 40 MG tablet Commonly known as: PROTONIX Take 40 mg by mouth daily.   polyethylene glycol 17 g packet Commonly known as: MIRALAX / GLYCOLAX Take 17 g by mouth daily. What changed:   when to take this  reasons to take this   saccharomyces boulardii 250 MG capsule Commonly known as:  FLORASTOR Take 250 mg by mouth daily.   sennosides-docusate sodium 8.6-50 MG tablet Commonly known as: SENOKOT-S Take 1 tablet by mouth at bedtime.   tiZANidine 2 MG tablet Commonly known as: ZANAFLEX Take 2 mg by mouth every 8 (eight) hours as needed for muscle spasms.   torsemide 20 MG tablet Commonly known as: DEMADEX Take 2 tablets (40 mg total) by mouth daily. What changed: how much to take   Uribel 118 MG Caps Take 118 mg by mouth every 6 (six) hours as needed (uti symptoms).   vitamin B-12 1000 MCG tablet Commonly known as: CYANOCOBALAMIN Take 1,000 mcg by mouth daily.      Allergies  Allergen Reactions  . Sulfa Antibiotics Hives  . Biaxin [Clarithromycin] Hives  . Buspar [Buspirone]   . Influenza A (H1n1) Monoval Vac Other (See Comments)    Pt states that she was told by her MD not to get the influenza vaccine.    . Morphine Other (See Comments)    Reaction:  Dizziness and confusion   . Pyridium [Phenazopyridine Hcl] Other (See Comments)    Reaction:  Unknown   . Tuberculin Tests   . Ceftriaxone Anxiety  . Latex Rash  . Prednisone Rash  . Tape Rash   Discharge Instructions    Amb referral to AFIB Clinic   Complete by: As directed    Diet - low sodium heart healthy   Complete by: As directed    Increase activity  slowly   Complete by: As directed      Discharge Exam: Filed Weights   09/10/19 1316  Weight: 117.9 kg   Vitals:   09/11/19 1515 09/11/19 1530  BP: (!) 130/58 (!) 144/58  Pulse: (!) 127 (!) 128  Resp: (!) 21 (!) 25  Temp:    SpO2: 98% 97%   General: Appear in mild distress, no Rash; Oral Mucosa Clear, moist. no Abnormal Mass Or lumps Cardiovascular: S1 and S2 Present, no Murmur, Respiratory: increased respiratory effort, Bilateral Air entry present and bilateral Crackles, no wheezes Abdomen: Bowel Sound present, Soft and no tenderness, no hernia Extremities: bilateral Pedal edema, no calf tenderness Neurology: alert and oriented to time, place, and person affect appropriate.  The results of significant diagnostics from this hospitalization (including imaging, microbiology, ancillary and laboratory) are listed below for reference.    Significant Diagnostic Studies: Dg Chest Port 1 View  Result Date: 09/10/2019 CLINICAL DATA:  78 year old female with shortness of breath. EXAM: PORTABLE CHEST 1 VIEW COMPARISON:  Chest radiograph dated 07/02/2019. FINDINGS: Right-sided dialysis catheter in similar position. There is stable cardiomegaly with mild vascular congestion. No edema. No focal consolidation or pneumothorax. Right apical subpleural crescentic density may represent scarring or trace loculated effusion. There is atherosclerotic calcification of the aortic arch. No acute osseous pathology. IMPRESSION: 1. Stable cardiomegaly with mild vascular congestion. No focal consolidation or pneumothorax. 2. Right apical subpleural crescentic density may represent trace pleural effusion. Electronically Signed   By: Anner Crete M.D.   On: 09/10/2019 13:48   Dg Abd Portable 1v  Result Date: 09/11/2019 CLINICAL DATA:  Abdominal pain and a COVID-19 positive patient. EXAM: PORTABLE ABDOMEN - 1 VIEW COMPARISON:  None. FINDINGS: The bowel gas pattern is normal. No radio-opaque calculi or other  significant radiographic abnormality are seen. IMPRESSION: Negative exam. Electronically Signed   By: Inge Rise M.D.   On: 09/11/2019 09:53    Microbiology: Recent Results (from the past 240 hour(s))  SARS CORONAVIRUS 2 (TAT 6-24 HRS)  Nasopharyngeal Nasopharyngeal Swab     Status: Abnormal   Collection Time: 09/10/19  1:24 PM   Specimen: Nasopharyngeal Swab  Result Value Ref Range Status   SARS Coronavirus 2 POSITIVE (A) NEGATIVE Final    Comment: RESULT CALLED TO, READ BACK BY AND VERIFIED WITH: M.JOHNSON RN 2022 09/10/2019 MCCORMICK K (NOTE) SARS-CoV-2 target nucleic acids are DETECTED. The SARS-CoV-2 RNA is generally detectable in upper and lower respiratory specimens during the acute phase of infection. Positive results are indicative of active infection with SARS-CoV-2. Clinical  correlation with patient history and other diagnostic information is necessary to determine patient infection status. Positive results do  not rule out bacterial infection or co-infection with other viruses. The expected result is Negative. Fact Sheet for Patients: SugarRoll.be Fact Sheet for Healthcare Providers: https://www.woods-mathews.com/ This test is not yet approved or cleared by the [place] FDA and  has been authorized for detection and/or diagnosis of SARS-CoV-2 by FDA under an Emergency Use Authorization (EUA). This EUA will remain  in effect (meaning this test can be used)  for the duration of the COVID-19 declaration under Section 564(b)(1) of the Act, 21 U.S.C. section 360bbb-3(b)(1), unless the authorization is terminated or revoked sooner. Performed at [facility], [place] [address] [place], [place] [address]      Labs: CBC: Recent Labs  Lab 09/10/19 1329 09/11/19 0645  WBC 12.6* 13.3*  HGB 10.5* 9.1*  HCT 34.2* 29.2*  MCV 90.2 89.0  PLT 462* 423   Basic Metabolic Panel: Recent Labs  Lab 09/10/19 1329 09/11/19  0645  NA 135 133*  K 3.7 3.7  CL 94* 96*  CO2 26 26  GLUCOSE 281* 285*  BUN 26* 34*  CREATININE 5.81* 6.71*  CALCIUM 9.0 8.3*  PHOS 2.8  --    Liver Function Tests: No results for input(s): AST, ALT, ALKPHOS, BILITOT, PROT, ALBUMIN in the last 168 hours. No results for input(s): LIPASE, AMYLASE in the last 168 hours. No results for input(s): AMMONIA in the last 168 hours. Cardiac Enzymes: No results for input(s): CKTOTAL, CKMB, CKMBINDEX, TROPONINI in the last 168 hours. BNP (last 3 results) Recent Labs    03/02/19 0530 07/02/19 1734 09/10/19 1329  BNP 867.0* 97.0 869.0*   CBG: Recent Labs  Lab 09/10/19 2228 09/11/19 0926  GLUCAP 228* 256*    Time spent: 35 minutes  Signed:  Berle Mull  [facility]  09/11/2019 4:20 PM

## 2019-09-11 NOTE — Progress Notes (Signed)
Inpatient Diabetes Program Recommendations  AACE/ADA: New Consensus Statement on Inpatient Glycemic Control  Target Ranges:  Prepandial:   less than 140 mg/dL      Peak postprandial:   less than 180 mg/dL (1-2 hours)      Critically ill patients:  140 - 180 mg/dL   Results for Carolyn Valdez, Carolyn Valdez (MRN [id number]) as of 09/11/2019 12:15  Ref. Range 09/10/2019 22:28 09/11/2019 09:26  Glucose-Capillary Latest Ref Range: 70 - 99 mg/dL 228 (H) 256 (H)   Review of Glycemic Control  Diabetes history: DM2 Outpatient Diabetes medications: Glipizide 5 mg QAM, Lantus 45 units QHS, Novolog 0-5 units TID with meals Current orders for Inpatient glycemic control:Novolog 0-15 units TID with meals  Inpatient Diabetes Program Recommendations:   Insulin-Basal: Please consider ordering Lantus 12 units Q24H (based on 117.9 kg x 0.1 units).  Insulin-Correction: Please consider ordering Novolog 0-5 units QHS for bedtime correction.  Thanks, Barnie Alderman, RN, MSN, CDE Diabetes Coordinator Inpatient Diabetes Program [phone] (Team Pager from 8am to 5pm)

## 2019-09-11 NOTE — Consult Note (Signed)
[facility] for Heparin Infusion Indication: chest pain/ACS  Allergies  Allergen Reactions  . Sulfa Antibiotics Hives  . Biaxin [Clarithromycin] Hives  . Buspar [Buspirone]   . Influenza A (H1n1) Monoval Vac Other (See Comments)    Pt states that she was told by her MD not to get the influenza vaccine.    . Morphine Other (See Comments)    Reaction:  Dizziness and confusion   . Pyridium [Phenazopyridine Hcl] Other (See Comments)    Reaction:  Unknown   . Tuberculin Tests   . Ceftriaxone Anxiety  . Latex Rash  . Prednisone Rash  . Tape Rash    Patient Measurements: Height: 5\' 7"  (170.2 cm) Weight: 260 lb (117.9 kg) IBW/kg (Calculated) : 61.6 Heparin Dosing Weight: 89.3 kg  Vital Signs: Temp: 100.1 F (37.8 C) (11/24 0411) Temp Source: Oral (11/24 0411) BP: 120/52 (11/24 0830) Pulse Rate: 124 (11/24 0830)  Labs: Recent Labs    09/10/19 1329 09/10/19 1515 09/10/19 2226 09/11/19 0645  HGB 10.5*  --   --  9.1*  HCT 34.2*  --   --  29.2*  PLT 462*  --   --  396  APTT  --  35  --   --   LABPROT  --  13.9  --   --   INR  --  1.1  --   --   HEPARINUNFRC  --   --  0.46 <0.10*  CREATININE 5.81*  --   --  6.71*  TROPONINIHS 2,336* 2,021*  --   --     Estimated Creatinine Clearance: 9.5 mL/min (A) (by C-G formula based on SCr of 6.71 mg/dL (H)).   Medical History: Past Medical History:  Diagnosis Date  . (HFpEF) heart failure with preserved ejection fraction ([place])    a. 06/2018 Echo: EF 60-65%, no rwma. Gr2 DD. Mild LVH. Ao sclerosis w/o stenosis. Mild MR. Mild to mod LAE. Nl RV fxn. Mild RAE. Mod TR. PASP 60-57mmHg.  Marland Kitchen Anxiety   . Breast cancer ([place]) 1997   a. 1997 s/p right breast mastectomy  . Chest pain    a. 11/2008 MV: No ischemia. EF 66%.  . CKD (chronic kidney disease) stage 4, GFR 15-29 ml/min (HCC)   . Coronary artery calcification seen on CT scan    a. 01/2017 Chest CT: Atherosclerotic coronary Ca2+.  . Diabetes  mellitus without complication ([place])   . DJD (degenerative joint disease)   . Dysuria   . GIB (gastrointestinal bleeding)    a. 07/2018 EGD: Gastritis w/ hemorrhage, duodenal angioectasia. Friable gastric mucosa w/ bleeding on contact. All treated w/ argon plasma coagulation; b. 07/2018 s/p 2u PRBCs.  . H/O total knee replacement    right  . HTN (hypertension)   . Hypercholesteremia   . Kidney stone   . Obesity   . Recurrent urinary tract infection   . SVT (supraventricular tachycardia) ([place])   . Urinary incontinence   . Vaginal atrophy   . Yeast vaginitis     Medications:  (Not in a hospital admission)  Scheduled:  . aspirin  81 mg Oral Daily  . atorvastatin  40 mg Oral QHS  . digoxin  0.25 mg Intravenous Once  . insulin aspart  0-15 Units Subcutaneous TID WC  . metoprolol tartrate  12.5 mg Oral Q6H  . pantoprazole  40 mg Oral Daily   Infusions:  . heparin 1,100 Units/hr (09/10/19 2234)   PRN:  Anti-infectives (From admission,  onward)   None      Assessment: Pharmacy has consulted to initiate Heparin Infusion in 78yo patient admitted with increased SOB and dizziness. Initial troponin level was 2336 ng/L and BNP of 869 pg/mL. Patient has no history of PTA anticoagulation use. Platelets are currently elevated(462 k/uL). Baseline labs have been ordered and are pending. Will initiate therapy immediately.  COVID + patient.   Goal of Therapy:  Heparin level 0.3-0.7 units/ml Monitor platelets by anticoagulation protocol: Yes   Plan:  Give 4000 units bolus x 1 Start heparin infusion at 1100 units/hr Check anti-Xa level in 8 hours and daily while on heparin Continue to monitor H&H and platelets   11/23: HL @ 2226 = 0.46 Will recheck HL in 8 hrs on 11/24 @ 0630  11/24: HL @ 0816 (results available) <0.10  (was supposed to be drawn @ 630). Prior HL was therapeutic. Called nurse [place] [employer]) and was informed that she does not believe heparin was interrupted. The nurse will  follow-up to see if there is a line access issue.  Update: Lorrie does not see alerts for line issues with heparin. Unclear why level is undetectable.  1. Heparin: Bolus 2650 units x1 and increase drip rate to 1350 units/her and recheck HL in 8 hours (CrCl <30 mL/min).  Will check CBC with AM labs.   Rowland Lathe 09/11/2019,8:46 AM

## 2019-09-11 NOTE — Progress Notes (Signed)
ANTICOAGULATION CONSULT NOTE - Initial Consult  Pharmacy Consult for heparin  Indication: chest pain/ACS  Allergies  Allergen Reactions  . Sulfa Antibiotics Hives  . Biaxin [Clarithromycin] Hives  . Buspar [Buspirone]   . Influenza A (H1n1) [place] Vac Other (See Comments)    Pt states that she was told by her MD not to get the influenza vaccine.    . Morphine Other (See Comments)    Reaction:  Dizziness and confusion   . Pyridium [Phenazopyridine Hcl] Other (See Comments)    Reaction:  Unknown   . Tuberculin Tests   . Ceftriaxone Anxiety  . Latex Rash  . Prednisone Rash  . Tape Rash    Patient Measurements: Height: 5\' 7"  (170.2 cm) Weight: 274 lb 4 oz (124.4 kg) IBW/kg (Calculated) : 61.6 Heparin Dosing Weight: 89kg  Vital Signs: Temp: 97.9 F (36.6 C) (11/24 1955) BP: 144/77 (11/24 2100) Pulse Rate: 126 (11/24 2100)  Labs: Recent Labs    09/10/19 1329 09/10/19 1515 09/10/19 2226 09/11/19 0645 09/11/19 1627  HGB 10.5*  --   --  9.1*  --   HCT 34.2*  --   --  29.2*  --   PLT 462*  --   --  396  --   APTT  --  35  --   --   --   LABPROT  --  13.9  --   --   --   INR  --  1.1  --   --   --   HEPARINUNFRC  --   --  0.46 <0.10* 0.24*  CREATININE 5.81*  --   --  6.71*  --   TROPONINIHS 2,336* 2,021*  --   --   --     Estimated Creatinine Clearance: 9.8 mL/min (A) (by C-G formula based on SCr of 6.71 mg/dL (H)).   Medical History: Past Medical History:  Diagnosis Date  . (HFpEF) heart failure with preserved ejection fraction ([place])    a. 06/2018 Echo: EF 60-65%, no rwma. Gr2 DD. Mild LVH. Ao sclerosis w/o stenosis. Mild MR. Mild to mod LAE. Nl RV fxn. Mild RAE. Mod TR. PASP 60-57mmHg.  Marland Kitchen Anxiety   . Breast cancer ([place]) 1997   a. 1997 s/p right breast mastectomy  . Chest pain    a. 11/2008 MV: No ischemia. EF 66%.  . CKD (chronic kidney disease) stage 4, GFR 15-29 ml/min (H[facility])   . Coronary artery calcification seen on CT scan    a. 01/2017 Chest CT:  Atherosclerotic coronary Ca2+.  . Diabetes mellitus without complication ([place])   . DJD (degenerative joint disease)   . Dysuria   . GIB (gastrointestinal bleeding)    a. 07/2018 EGD: Gastritis w/ hemorrhage, duodenal angioectasia. Friable gastric mucosa w/ bleeding on contact. All treated w/ argon plasma coagulation; b. 07/2018 s/p 2u PRBCs.  . H/O total knee replacement    right  . HTN (hypertension)   . Hypercholesteremia   . Kidney stone   . Obesity   . Recurrent urinary tract infection   . SVT (supraventricular tachycardia) ([place])   . Urinary incontinence   . Vaginal atrophy   . Yeast vaginitis     Medications:  Medications Prior to Admission  Medication Sig Dispense Refill Last Dose  . acetaminophen (TYLENOL) 325 MG tablet Take 2 tablets (650 mg total) by mouth every 6 (six) hours as needed for mild pain (or Fever >/= 101).     Marland Kitchen albuterol (PROVENTIL HFA;VENTOLIN HFA) 108 (90  Base) MCG/ACT inhaler Inhale 2 puffs into the lungs every 6 (six) hours as needed for wheezing or shortness of breath. 1 Inhaler 1   . amoxicillin (AMOXIL) 500 MG capsule Take 2,000 mg by mouth as directed. Prior to dental procedure     . aspirin 81 MG chewable tablet Chew 1 tablet (81 mg total) by mouth daily.     Marland Kitchen atorvastatin (LIPITOR) 40 MG tablet Take 1 tablet (40 mg total) by mouth daily at 6 PM. (Patient taking differently: Take 40 mg by mouth at bedtime. )     . calcium acetate (PHOSLO) 667 MG capsule Take 1,334 mg by mouth 3 (three) times daily before meals.     . camphor-menthol (MEN-PHOR) lotion Apply 1 application topically every 6 (six) hours as needed for itching.     . citalopram (CELEXA) 10 MG tablet Take 1 tablet (10 mg total) by mouth daily. 30 tablet 0   . clonazePAM (KLONOPIN) 0.25 MG disintegrating tablet Take 1 tablet (0.25 mg total) by mouth every 12 (twelve) hours as needed (anxiety). 20 tablet 0   . docusate sodium (COLACE) 100 MG capsule Take 100 mg by mouth 2 (two) times daily.       . ferric citrate (AURYXIA) 1 GM 210 MG(Fe) tablet Take 420 mg by mouth 3 (three) times daily with meals.      . fluticasone (FLONASE) 50 MCG/ACT nasal spray Place 1 spray into both nostrils daily.      Marland Kitchen glipiZIDE (GLUCOTROL) 5 MG tablet Take 5 mg by mouth daily before breakfast.     . heparin 25000-0.45 UT/250ML-% infusion Inject 1,350 Units/hr into the vein continuous.     . hydrALAZINE (APRESOLINE) 25 MG tablet Take 1 tablet (25 mg total) by mouth 3 (three) times daily with meals.     . insulin aspart (NOVOLOG) 100 UNIT/ML injection 3 (three) times daily before meals. Inject per sliding scale 0-199=0, 200-500=5 units, notify MD for blood sugar> or equal to 500     . insulin glargine (LANTUS) 100 UNIT/ML injection Inject 0.4 mLs (40 Units total) into the skin daily. (Patient taking differently: Inject 45 Units into the skin at bedtime. ) 100 mL 0   . iron polysaccharides (NIFEREX) 150 MG capsule Take 150 mg by mouth daily.     Marland Kitchen lamoTRIgine (LAMICTAL) 25 MG tablet Take 1 tablet (25 mg total) by mouth daily. 30 tablet 0   . levETIRAcetam (KEPPRA) 250 MG tablet Take 1 tablet (250 mg total) by mouth 2 (two) times daily. 30 tablet 0   . loratadine (CLARITIN) 10 MG tablet Take 10 mg by mouth daily.     Marland Kitchen losartan (COZAAR) 50 MG tablet Take 50 mg by mouth daily.     . Melatonin 3 MG TABS Take 6 mg by mouth at bedtime.     . Meth-Hyo-M Bl-Na Phos-Ph Sal (URIBEL) 118 MG CAPS Take 118 mg by mouth every 6 (six) hours as needed (uti symptoms).     . metoprolol tartrate (LOPRESSOR) 50 MG tablet Take 50 mg by mouth 2 (two) times daily.     . mirabegron ER (MYRBETRIQ) 25 MG TB24 tablet Take 25 mg by mouth daily.     . Multiple Vitamin (MULTIVITAMIN) tablet Take 1 tablet by mouth daily.     Marland Kitchen nystatin (NYSTATIN) powder Apply 1 g topically See admin instructions. Apply to under breast and abdominal once daily & apply every 12 hours as needed for abdominal folds, groin, and under breast     .  OLANZapine  (ZYPREXA) 5 MG tablet Take 5 mg by mouth at bedtime.     . ondansetron (ZOFRAN-ODT) 8 MG disintegrating tablet Take 8 mg by mouth every 8 (eight) hours as needed for nausea/vomiting.     Marland Kitchen oxyCODONE-acetaminophen (PERCOCET/ROXICET) 5-325 MG tablet Take 1 tablet by mouth every 6 (six) hours as needed for moderate pain or severe pain. (Patient not taking: Reported on 09/10/2019) 30 tablet 0   . pantoprazole (PROTONIX) 40 MG tablet Take 40 mg by mouth daily.      . polyethylene glycol (MIRALAX / GLYCOLAX) packet Take 17 g by mouth daily. (Patient taking differently: Take 17 g by mouth as needed. ) 14 each 0   . saccharomyces boulardii (FLORASTOR) 250 MG capsule Take 250 mg by mouth daily.     . sennosides-docusate sodium (SENOKOT-S) 8.6-50 MG tablet Take 1 tablet by mouth at bedtime.     Marland Kitchen tiZANidine (ZANAFLEX) 2 MG tablet Take 2 mg by mouth every 8 (eight) hours as needed for muscle spasms.     Marland Kitchen torsemide (DEMADEX) 20 MG tablet Take 2 tablets (40 mg total) by mouth daily. (Patient taking differently: Take 20 mg by mouth daily. ) 30 tablet 0   . vitamin B-12 (CYANOCOBALAMIN) 1000 MCG tablet Take 1,000 mcg by mouth daily.      Scheduled:  . [START ON 09/12/2019] aspirin  81 mg Oral Daily  . [START ON 09/12/2019] atorvastatin  40 mg Oral q1800  . [START ON 09/12/2019] calcium acetate  1,334 mg Oral TID AC  . [START ON 09/12/2019] citalopram  10 mg Oral Daily  . dexamethasone  6 mg Oral Daily  . docusate sodium  100 mg Oral BID  . [START ON 09/12/2019] insulin aspart  0-9 Units Subcutaneous Q4H  . insulin glargine  45 Units Subcutaneous QHS  . [START ON 09/12/2019] lamoTRIgine  25 mg Oral Daily  . levETIRAcetam  250 mg Oral BID  . metoprolol tartrate  25 mg Oral Q6H  . [START ON 09/12/2019] mirabegron ER  25 mg Oral Daily  . OLANZapine  5 mg Oral QHS  . [START ON 09/12/2019] pantoprazole  40 mg Oral Daily  . senna  1 tablet Oral BID  . sodium chloride flush  3 mL Intravenous Q12H  . [START ON  09/12/2019] vitamin C  500 mg Oral Daily  . [START ON 09/12/2019] zinc sulfate  220 mg Oral Daily    Assessment: 78 yo female with COVID and on heparin aflutter/possible ACS (heparin started at [facility]). Heparin currently infusing at 1500 units/hr -last heparin level was 0.24 earlier today  Goal of Therapy:  Heparin level 0.3-0.7 units/ml Monitor platelets by anticoagulation protocol: Yes   Plan:  -Continue heparin at 1500 units/hr -Check heparin level at midnight -Daily heparin level and CBC  Hildred Laser, PharmD Clinical Pharmacist **Pharmacist phone directory can now be found on [email or web address] ([email or web address]).  Listed under [place].

## 2019-09-11 NOTE — Progress Notes (Signed)
Patient seen at bedside, covid-19 positive, proper room for HD wasn't available yesterday, once available pt may have HD.

## 2019-09-11 NOTE — Progress Notes (Signed)
Progress Note  Patient Name: Carolyn Valdez Date of Encounter: 09/11/2019  Primary Cardiologist: Kathlyn Sacramento, MD   Subjective   Patient still short of breath, more so than yesterday.  She is unable to lie flat.  She denies chest pain but has continuous palpitations.  These kept her up last night.  Inpatient Medications    Scheduled Meds: . aspirin  81 mg Oral Daily  . atorvastatin  40 mg Oral QHS  . insulin aspart  0-15 Units Subcutaneous TID WC  . metoprolol tartrate  12.5 mg Oral Q6H  . pantoprazole  40 mg Oral Daily   Continuous Infusions: . heparin 1,350 Units/hr (09/11/19 0934)   PRN Meds: acetaminophen, ondansetron (ZOFRAN) IV   Vital Signs    Vitals:   09/11/19 0630 09/11/19 0830 09/11/19 0930 09/11/19 0932  BP: 107/68 (!) 120/52 118/69 118/69  Pulse: (!) 126 (!) 124 (!) 125 (!) 125  Resp: 20 (!) 21 19   Temp:      TempSrc:      SpO2: 98% 97% 99%   Weight:      Height:        Intake/Output Summary (Last 24 hours) at 09/11/2019 0954 Last data filed at 09/11/2019 0934 Gross per 24 hour  Intake 265.65 ml  Output -  Net 265.65 ml   Last 3 Weights 09/10/2019 08/16/2019 07/06/2019  Weight (lbs) 260 lb 247 lb 8 oz 270 lb 8.1 oz  Weight (kg) 117.935 kg 112.265 kg 122.7 kg      Telemetry    Atrial flutter with predominantly 2:1 AV block.  Some variable block noted. - Personally Reviewed  ECG    No new tracing - Personally Reviewed  Physical Exam   GEN:  Anxious appearing. Neck:  JVP difficult to assess due to body habitus. Cardiac:  Tachycardic and regular without murmurs. Respiratory:  Diminished breath sounds anteriorly.  Patient unable to sit up or roll to allow for posterior auscultation. GI: Soft, nontender, non-distended  MS:  Trace pretibial edema bilaterally; No deformity. Neuro:  Nonfocal  Psych: Anxious  Labs    High Sensitivity Troponin:   Recent Labs  Lab 09/10/19 1329 09/10/19 1515  TROPONINIHS 2,336* 2,021*      Chemistry  Recent Labs  Lab 09/10/19 1329 09/11/19 0645  NA 135 133*  K 3.7 3.7  CL 94* 96*  CO2 26 26  GLUCOSE 281* 285*  BUN 26* 34*  CREATININE 5.81* 6.71*  CALCIUM 9.0 8.3*  GFRNONAA 7* 5*  GFRAA 8* 6*  ANIONGAP 15 11     Hematology Recent Labs  Lab 09/10/19 1329 09/11/19 0645  WBC 12.6* 13.3*  RBC 3.79* 3.28*  HGB 10.5* 9.1*  HCT 34.2* 29.2*  MCV 90.2 89.0  MCH 27.7 27.7  MCHC 30.7 31.2  RDW 15.1 14.9  PLT 462* 396    BNP Recent Labs  Lab 09/10/19 1329  BNP 869.0*     DDimer No results for input(s): DDIMER in the last 168 hours.   Radiology    Dg Chest Port 1 View  Result Date: 09/10/2019 CLINICAL DATA:  78 year old female with shortness of breath. EXAM: PORTABLE CHEST 1 VIEW COMPARISON:  Chest radiograph dated 07/02/2019. FINDINGS: Right-sided dialysis catheter in similar position. There is stable cardiomegaly with mild vascular congestion. No edema. No focal consolidation or pneumothorax. Right apical subpleural crescentic density may represent scarring or trace loculated effusion. There is atherosclerotic calcification of the aortic arch. No acute osseous pathology. IMPRESSION: 1. Stable cardiomegaly  with mild vascular congestion. No focal consolidation or pneumothorax. 2. Right apical subpleural crescentic density may represent trace pleural effusion. Electronically Signed   By: Anner Crete M.D.   On: 09/10/2019 13:48    Cardiac Studies   TTE (03/02/2019):   1. The left ventricle has low normal systolic function, with an ejection fraction of 50-55%. The cavity size was mildly dilated. There is mild concentric left ventricular hypertrophy. Left ventricular diastolic Doppler parameters are consistent with  pseudonormalization.  2. The right ventricle has normal systolic function. The cavity was normal. There is no increase in right ventricular wall thickness. Right ventricular systolic pressure is severely elevated with an estimated pressure of 72.7 mmHg.  3.  Left atrial size was mildly dilated.  4. Right atrial size was mildly dilated.  5. The mitral valve is grossly normal. There is moderate mitral annular calcification present. Mild mitral valve stenosis.  6. The tricuspid valve is grossly normal. Tricuspid valve regurgitation is moderate.  7. The aortic valve is grossly normal. Aortic valve regurgitation was not assessed by color flow Doppler.   Patient Profile     78 y.o. female with history of chronic HFpEF, pulmonary hypertension, GI bleed (07/2018), hypertension, hyperlipidemia, diabetes mellitus, obesity, and ESRD on hemodialysis, recently diagnosed with COVID-19 and presenting with palpitations and shortness of breath in the setting of continued COVID-19 infection.  Assessment & Plan    Atrial flutter: Patient is in atrial flutter with predominantly 2-1 AV block and ventricular rates around 125 bpm.  She notes that at least a few days of anticoagulation were missed leading up to this hospitalization.  She received 1 dose of IV metoprolol yesterday and her first dose of oral metoprolol about 1 hour ago.  Digoxin was also ordered last night but never administered.  Continue metoprolol tartrate 12.5 mg every 6 hours, as blood pressure tolerates.  This can be escalated if possible.  If blood pressure drops, we may need to consider amiodarone infusion for heart rate control, though this is suboptimal in the setting of the patient having missed anticoagulation prior to presentation.  Continue heparin infusion.  Ultimately, the patient may need TEE guided cardioversion if rate control remains difficult.  However, in the setting of COVID-19 infection, I would like to avoid this if at all possible, as the risk for recurrent flutter is high.  Elevated troponin: Initial troponin elevated at 2336, with repeat 2 hours later downtrending at 2021.  This is most reflective of supply-demand mismatch or subacute ACS with inciting event occurring days  ago.  In the setting of respiratory distress with inability to lie flat and COVID-19 infection, I recommend against urgent cardiac catheterization.  Continue heparin infusion.  Continue aspirin 81 mg daily.  Defer adding clopidogrel, as the patient will need long-term anticoagulation in the setting of atrial flutter.  Follow-up echocardiogram, when it is safe to perform this from a COVID-19 standpoint.  Acute on chronic HFpEF: Dyspnea is likely multifactorial with some element of pulmonary edema possible in the setting of atrial flutter.  Pneumonitis related to COVID-19 is also possible.  Gentle fluid removal with hemodialysis, as tolerated.  If hypotension is a problem, CVVH may be needed.  Metoprolol, as above.  Allie Ousley-stage renal disease:  Hemodialysis per nephrology.  As above, if hypotension remains problematic, the patient may require CVVH.  Disposition: Given ESRD requiring hemodialysis, and COVID-19 positive status, plans are being made for transfer to [facility] will continue to follow  the patient at [facility] after transfer.    For questions or updates, please contact [place] Please consult [email or web address] for contact info under [facility].     Signed, Nelva Bush, MD  09/11/2019, 9:54 AM

## 2019-09-11 NOTE — ED Notes (Signed)
Pt provided with dinner tray.

## 2019-09-12 ENCOUNTER — Inpatient Hospital Stay (HOSPITAL_COMMUNITY): Payer: Medicare Other

## 2019-09-12 DIAGNOSIS — R0602 Shortness of breath: Secondary | ICD-10-CM

## 2019-09-12 DIAGNOSIS — I4891 Unspecified atrial fibrillation: Secondary | ICD-10-CM

## 2019-09-12 DIAGNOSIS — I249 Acute ischemic heart disease, unspecified: Secondary | ICD-10-CM

## 2019-09-12 DIAGNOSIS — I483 Typical atrial flutter: Secondary | ICD-10-CM

## 2019-09-12 LAB — RENAL FUNCTION PANEL
Albumin: 2.7 g/dL — ABNORMAL LOW (ref 3.5–5.0)
Anion gap: 11 (ref 5–15)
BUN: 41 mg/dL — ABNORMAL HIGH (ref 8–23)
CO2: 26 mmol/L (ref 22–32)
Calcium: 9.1 mg/dL (ref 8.9–10.3)
Chloride: 96 mmol/L — ABNORMAL LOW (ref 98–111)
Creatinine, Ser: 6.99 mg/dL — ABNORMAL HIGH (ref 0.44–1.00)
GFR calc Af Amer: 6 mL/min — ABNORMAL LOW (ref >60)
GFR calc non Af Amer: 5 mL/min — ABNORMAL LOW (ref >60)
Glucose, Bld: 172 mg/dL — ABNORMAL HIGH (ref 70–99)
Phosphorus: 4 mg/dL (ref 2.5–4.6)
Potassium: 3.8 mmol/L (ref 3.5–5.1)
Sodium: 133 mmol/L — ABNORMAL LOW (ref 135–145)

## 2019-09-12 LAB — HEPARIN LEVEL (UNFRACTIONATED)
Heparin Unfractionated: 0.19 IU/mL — ABNORMAL LOW (ref 0.30–0.70)
Heparin Unfractionated: 0.24 IU/mL — ABNORMAL LOW (ref 0.30–0.70)
Heparin Unfractionated: 0.42 IU/mL (ref 0.30–0.70)

## 2019-09-12 LAB — CBC WITH DIFFERENTIAL/PLATELET
Abs Immature Granulocytes: 0.17 10*3/uL — ABNORMAL HIGH (ref 0.00–0.07)
Basophils Absolute: 0 10*3/uL (ref 0.0–0.1)
Basophils Relative: 0 %
Eosinophils Absolute: 0.1 10*3/uL (ref 0.0–0.5)
Eosinophils Relative: 1 %
HCT: 31.4 % — ABNORMAL LOW (ref 36.0–46.0)
Hemoglobin: 9.6 g/dL — ABNORMAL LOW (ref 12.0–15.0)
Immature Granulocytes: 2 %
Lymphocytes Relative: 19 %
Lymphs Abs: 2 10*3/uL (ref 0.7–4.0)
MCH: 28.2 pg (ref 26.0–34.0)
MCHC: 30.6 g/dL (ref 30.0–36.0)
MCV: 92.4 fL (ref 80.0–100.0)
Monocytes Absolute: 1.2 10*3/uL — ABNORMAL HIGH (ref 0.1–1.0)
Monocytes Relative: 12 %
Neutro Abs: 6.8 10*3/uL (ref 1.7–7.7)
Neutrophils Relative %: 66 %
Platelets: 439 10*3/uL — ABNORMAL HIGH (ref 150–400)
RBC: 3.4 MIL/uL — ABNORMAL LOW (ref 3.87–5.11)
RDW: 15 % (ref 11.5–15.5)
WBC: 10.3 10*3/uL (ref 4.0–10.5)
nRBC: 0 % (ref 0.0–0.2)

## 2019-09-12 LAB — LIPID PANEL
Cholesterol: 109 mg/dL (ref 0–200)
HDL: 32 mg/dL — ABNORMAL LOW (ref >40)
LDL Cholesterol: 50 mg/dL (ref 0–99)
Total CHOL/HDL Ratio: 3.4 RATIO
Triglycerides: 135 mg/dL (ref <150)
VLDL: 27 mg/dL (ref 0–40)

## 2019-09-12 LAB — COMPREHENSIVE METABOLIC PANEL
ALT: 13 U/L (ref 0–44)
AST: 16 U/L (ref 15–41)
Albumin: 2.8 g/dL — ABNORMAL LOW (ref 3.5–5.0)
Alkaline Phosphatase: 73 U/L (ref 38–126)
Anion gap: 11 (ref 5–15)
BUN: 38 mg/dL — ABNORMAL HIGH (ref 8–23)
CO2: 26 mmol/L (ref 22–32)
Calcium: 9.2 mg/dL (ref 8.9–10.3)
Chloride: 98 mmol/L (ref 98–111)
Creatinine, Ser: 6.9 mg/dL — ABNORMAL HIGH (ref 0.44–1.00)
GFR calc Af Amer: 6 mL/min — ABNORMAL LOW (ref >60)
GFR calc non Af Amer: 5 mL/min — ABNORMAL LOW (ref >60)
Glucose, Bld: 195 mg/dL — ABNORMAL HIGH (ref 70–99)
Potassium: 3.9 mmol/L (ref 3.5–5.1)
Sodium: 135 mmol/L (ref 135–145)
Total Bilirubin: 1 mg/dL (ref 0.3–1.2)
Total Protein: 6.4 g/dL — ABNORMAL LOW (ref 6.5–8.1)

## 2019-09-12 LAB — TYPE AND SCREEN
ABO/RH(D): O POS
Antibody Screen: NEGATIVE

## 2019-09-12 LAB — GLUCOSE, CAPILLARY
Glucose-Capillary: 154 mg/dL — ABNORMAL HIGH (ref 70–99)
Glucose-Capillary: 150 mg/dL — ABNORMAL HIGH (ref 70–99)
Glucose-Capillary: 180 mg/dL — ABNORMAL HIGH (ref 70–99)
Glucose-Capillary: 153 mg/dL — ABNORMAL HIGH (ref 70–99)
Glucose-Capillary: 141 mg/dL — ABNORMAL HIGH (ref 70–99)

## 2019-09-12 LAB — C-REACTIVE PROTEIN: CRP: 14.3 mg/dL — ABNORMAL HIGH (ref <1.0)

## 2019-09-12 LAB — MAGNESIUM: Magnesium: 2 mg/dL (ref 1.7–2.4)

## 2019-09-12 LAB — FERRITIN: Ferritin: 465 ng/mL — ABNORMAL HIGH (ref 11–307)

## 2019-09-12 LAB — TSH: TSH: 0.893 u[IU]/mL (ref 0.350–4.500)

## 2019-09-12 LAB — D-DIMER, QUANTITATIVE: D-Dimer, Quant: 1.03 ug/mL-FEU — ABNORMAL HIGH (ref 0.00–0.50)

## 2019-09-12 LAB — PHOSPHORUS: Phosphorus: 3.5 mg/dL (ref 2.5–4.6)

## 2019-09-12 LAB — ABO/RH: ABO/RH(D): O POS

## 2019-09-12 MED ORDER — HEPARIN BOLUS VIA INFUSION
2500.0000 [IU] | Freq: Once | INTRAVENOUS | Status: AC
  Administered 2019-09-12: 2500 [IU] via INTRAVENOUS
  Filled 2019-09-12: qty 2500

## 2019-09-12 MED ORDER — ALTEPLASE 2 MG IJ SOLR
INTRAMUSCULAR | Status: AC
  Administered 2019-09-12: 2 mg
  Filled 2019-09-12: qty 4

## 2019-09-12 MED ORDER — CHLORHEXIDINE GLUCONATE CLOTH 2 % EX PADS
6.0000 | MEDICATED_PAD | Freq: Every day | CUTANEOUS | Status: DC
  Administered 2019-09-12 – 2019-09-14 (×2): 6 via TOPICAL

## 2019-09-12 MED ORDER — HEPARIN SODIUM (PORCINE) 1000 UNIT/ML IJ SOLN
INTRAMUSCULAR | Status: AC
  Administered 2019-09-12: 3300 [IU]
  Filled 2019-09-12: qty 4

## 2019-09-12 NOTE — Progress Notes (Signed)
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  She initially presented to ED from dialysis where she was hypotensive and tachycardic.  Dialysis session had to be stopped early.  She was diagnosed with Covid 19 on 11 4 at ALF.  She did not have similar symptoms in the been out of hospital.  She denied chest pain.  At the time of presentation to [facility], she had troponin elevation of 2336, global ST depression, Tuesday 1 atrial flutter on EKG.  She was also in atrial flutter with RVR, cardiology was consulted for non-STEMI and atrial flutter.  She was started on heparin infusion with beta-blockers.  Plan was to pursue cardiac catheterization however she became hypotensive in [place] ED and was subsequently stabilized, blood pressure was stabilized and patient transferred to [facility].    Subjective   Today patient denies any chest pain or shortness of breath.   Assessment/Plan:     1. Non-STEMI/HFpEF-patient had troponin elevation to 2336 with EKG changes.  Cardiology at [facility] started patient on heparin.  Continue aspirin, statin, beta-blockers.  There was a plan for cardiac catheterization.  Will consult cardiology at [facility] for further recommendations.  2. Atrial flutter with RVR-continue heparin, metoprolol 25 mg p.o. every 6 hours.  3. ESRD on hemodialysis MWF-we will consult nephrology for dialysis today.  4. COVID-19 infection-patient has been tested positive for COVID-19, she is currently asymptomatic.  Chest x-ray does not show infectious process.   Patient did have elevated CRP, 11.7, ferritin 392.  Will repeat these labs today and if still elevated consider starting  remdesivir and oral steroids.  5. Hypertension-blood pressure stable, continue metoprolol.  6. Diabetes mellitus-glucose is fairly well controlled, continue Lantus 25 units subcu daily, sliding scale insulin with NovoLog.  7. History of seizure disorder-continue Keppra.     CBG: Recent Labs  Lab 09/11/19 2050 09/11/19 2307 09/12/19 0424 09/12/19 0842 09/12/19 1121  GLUCAP 205* 189* 154* 150* 180*    CBC: Recent Labs  Lab 09/10/19 1329 09/11/19 0645 09/11/19 2340  WBC 12.6* 13.3* 10.3  NEUTROABS  --   --  6.8  HGB 10.5* 9.1* 9.6*  HCT 34.2* 29.2* 31.4*  MCV 90.2 89.0 92.4  PLT 462* 396 439*    Basic Metabolic Panel: Recent Labs  Lab 09/10/19 1329 09/11/19 0645 09/11/19 2340 09/12/19 1010  NA 135 133* 135 133*  K 3.7 3.7 3.9 3.8  CL 94* 96* 98 96*  CO2 26 26 26 26   GLUCOSE 281* 285* 195* 172*  BUN 26* 34* 38* 41*  CREATININE 5.81* 6.71* 6.90* 6.99*  CALCIUM 9.0 8.3* 9.2 9.1  MG  --   --  2.0  --   PHOS 2.8  --  3.5 4.0    COVID-19 Labs  Recent Labs    09/11/19 1627 09/11/19 2340  DDIMER  --  1.03*  FERRITIN 392*  --   CRP 11.7*  --     Lab Results  Component Value Date   SARSCOV2NAA POSITIVE (A) 09/10/2019   SARSCOV2NAA NEGATIVE 07/04/2019   [place] NEGATIVE 07/02/2019   [place] NEGATIVE 05/29/2019    DVT prophylaxis: Heparin  Code Status: Full code  Family Communication: No family at bedside  Disposition Plan: likely home when medically ready for discharge Pressure Injury 09/12/19 Sacrum Medial Stage I -  Intact skin with non-blanchable redness of a localized area usually over a bony prominence. (Active)  09/12/19 0010  Location: Sacrum  Location Orientation: Medial  Staging: Stage I -  Intact skin with non-blanchable redness of a localized area usually over a bony prominence.  Wound Description  (Comments):   Present on Admission: Yes     BMI  Estimated body mass index is 43.33 kg/m as calculated from the following:   Height as of this encounter: 5\' 7"  (1.702 m).   Weight as of this encounter: 125.5 kg.  Scheduled medications:  . aspirin  81 mg Oral Daily  . atorvastatin  40 mg Oral q1800  . calcium acetate  1,334 mg Oral TID AC  . Chlorhexidine Gluconate Cloth  6 each Topical Q0600  . citalopram  10 mg Oral Daily  . docusate sodium  100 mg Oral BID  . heparin      . insulin aspart  0-9 Units Subcutaneous Q4H  . insulin glargine  25 Units Subcutaneous QHS  . lamoTRIgine  25 mg Oral Daily  . levETIRAcetam  250 mg Oral BID  . metoprolol tartrate  25 mg Oral Q6H  . mirabegron ER  25 mg Oral Daily  . OLANZapine  5 mg Oral QHS  . pantoprazole  40 mg Oral Daily  . senna  1 tablet Oral BID  . sodium chloride flush  3 mL Intravenous Q12H  . [START ON 09/13/2019] torsemide  40 mg Oral Once per day on Sun Tue Thu Sat  . vitamin C  500 mg Oral Daily  . zinc sulfate  220 mg Oral Daily    Consultants:    Procedures:     Antibiotics:   Anti-infectives (From admission, onward)   Start     Dose/Rate Route Frequency Ordered Stop   09/12/19 2217  remdesivir 100 mg in sodium chloride 0.9 % 250 mL IVPB  Status:  Discontinued     100 mg 500 mL/hr over 30 Minutes Intravenous Every 24 hours 09/11/19 2224 09/11/19 2252   09/11/19 2230  remdesivir 200 mg in sodium chloride 0.9 % 250 mL IVPB  Status:  Discontinued     200 mg 500 mL/hr over 30 Minutes Intravenous Once 09/11/19 2224 09/11/19 2252       Objective   Vitals:   09/12/19 1111 09/12/19 1256 09/12/19 1313 09/12/19 1330  BP: 117/71 125/71 135/79 125/77  Pulse: (!) 126 (!) 124 (!) 123 (!) 124  Resp:  15 18 (!) 22  Temp:      TempSrc:      SpO2:  99% 100% 98%  Weight:  125.5 kg    Height:        Intake/Output Summary (Last 24 hours) at 09/12/2019 1442 Last data filed at 09/12/2019 1000 Gross per 24 hour   Intake 176.56 ml  Output 150 ml  Net 26.56 ml   Filed Weights   09/11/19 2048 09/12/19 1256  Weight: 124.4 kg 125.5 kg     Physical Examination:    General: Appears in no acute distress  Cardiovascular: S1-S2, irregular, no murmur auscultated  Respiratory: Clear to auscultation  Abdomen: Abdomen is soft, nontender, no organomegaly  Extremities: No edema in the lower extremities  Neurologic: Alert, oriented x3, intact insight and judgment, no focal deficit noted  Data Reviewed: I have personally reviewed following labs and imaging studies   Recent Results (from the past 240 hour(s))  SARS CORONAVIRUS 2 (TAT 6-24 HRS) Nasopharyngeal Nasopharyngeal Swab     Status: Abnormal   Collection Time: 09/10/19  1:24 PM   Specimen: Nasopharyngeal Swab  Result Value Ref Range Status   SARS Coronavirus 2 POSITIVE (A) NEGATIVE Final    Comment: RESULT CALLED TO, READ BACK BY AND VERIFIED WITH: M.JOHNSON RN 2022 09/10/2019 MCCORMICK K (NOTE) SARS-CoV-2 target nucleic acids are DETECTED. The SARS-CoV-2 RNA is generally detectable in upper and lower respiratory specimens during the acute phase of infection. Positive results are indicative of active infection with SARS-CoV-2. Clinical  correlation with patient history and other diagnostic information is necessary to determine patient infection status. Positive results do  not rule out bacterial infection or co-infection with other viruses. The expected result is Negative. Fact Sheet for Patients: SugarRoll.be Fact Sheet for Healthcare Providers: https://www.woods-mathews.com/ This test is not yet approved or cleared by the [place] FDA and  has been authorized for detection and/or diagnosis of SARS-CoV-2 by FDA under an Emergency Use Authorization (EUA). This EUA will remain  in effect (meaning this test can be used)  for the duration of the COVID-19 declaration under Section  564(b)(1) of the Act, 21 U.S.C. section 360bbb-3(b)(1), unless the authorization is terminated or revoked sooner. Performed at [facility], [place] [address] [place], [place] [address]   MRSA PCR Screening     Status: None   Collection Time: 09/11/19  9:01 PM   Specimen: Nasopharyngeal  Result Value Ref Range Status   MRSA by PCR NEGATIVE NEGATIVE Final    Comment:        The GeneXpert MRSA Assay (FDA approved for NASAL specimens only), is one component of a comprehensive MRSA colonization surveillance program. It is not intended to diagnose MRSA infection nor to guide or monitor treatment for MRSA infections. Performed at [facility], [place] [address], , [place] [address]      Liver Function Tests: Recent Labs  Lab 09/11/19 2340 09/12/19 1010  AST 16  --   ALT 13  --   ALKPHOS 73  --   BILITOT 1.0  --   PROT 6.4*  --   ALBUMIN 2.8* 2.7*   No results for input(s): LIPASE, AMYLASE in the last 168 hours. No results for input(s): AMMONIA in the last 168 hours.  Cardiac Enzymes: No results for input(s): CKTOTAL, CKMB, CKMBINDEX, TROPONINI in the last 168 hours. BNP (last 3 results) Recent Labs    03/02/19 0530 07/02/19 1734 09/10/19 1329  BNP 867.0* 97.0 869.0*    ProBNP (last 3 results) No results for input(s): PROBNP in the last 8760 hours.    Studies: Portable Chest 1 View  Result Date: 09/12/2019 CLINICAL DATA:  Shortness of breath. EXAM: PORTABLE CHEST 1 VIEW COMPARISON:  09/10/2019. FINDINGS: Right-sided dialysis catheter in stable position. Stable cardiomegaly and mild pulmonary vascular congestion noted. Mild bibasilar atelectasis with improved aeration from prior exam. Small left pleural effusion cannot be excluded. Biapical pleural thickening again noted most consistent scarring. No pneumothorax. IMPRESSION: 1.  Right-sided dialysis catheter stable position. 2. Stable cardiomegaly and mild pulmonary vascular congestion noted. 3. Mild  bibasilar atelectasis with improved aeration from prior exam. Small left pleural effusion cannot be excluded. Electronically Signed   By: Marcello Moores  Register   On: 09/12/2019 07:51   Dg Abd Portable 1v  Result Date: 09/11/2019 CLINICAL DATA:  Abdominal pain and a COVID-19 positive patient. EXAM: PORTABLE ABDOMEN - 1 VIEW COMPARISON:  None. FINDINGS: The bowel gas pattern is normal. No radio-opaque calculi or other significant radiographic abnormality are seen. IMPRESSION: Negative exam. Electronically Signed   By: Inge Rise M.D.   On: 09/11/2019 09:53     Admission status: Inpatient: Based on patients clinical presentation and evaluation of above clinical data, I have made determination that patient meets Inpatient criteria at this time.   [facility] Pager [phone]. If 7PM-7AM, please contact night-coverage at [email or web address], Office  [phone]  password [place]  09/12/2019, 2:42 PM  LOS: 1 day

## 2019-09-12 NOTE — Consult Note (Signed)
[facility] Renal Consultation Note  Requesting MD: Eleonore Chiquito   Indication for Consultation:  ESRD  Chief complaint: hypotension, from HD  HPI:  Carolyn Valdez is a 78 y.o. female with a history of ESRD on HD MWF via tunneled catheter RIJ, chronic heart failure with preserved EF, severe pulmonary HTN, afib/flutter who presented to [facility] from [facility] for dialysis.  She has been diagnosed with covid .  Troponin was elevated at the OSH and cardiology was consulted for for same and a flutter.  Cath is under consideration though per their note is not currently planned.  She has been on heparin and metoprolol.  She states last HD was on Monday this week and was stopped due to hypotension.  Nursing attempted to run HD today and catheter not working well - BF of 200 - catheter was ordered for TPA and treatment post-poned.  She is a patient at [facility].  She has had some shortness of breath and some nausea - no dizziness or cramping.  Hasn't had a fistula placed yet.  PMHx:   Past Medical History:  Diagnosis Date  . (HFpEF) heart failure with preserved ejection fraction ([place])    a. 06/2018 Echo: EF 60-65%, no rwma. Gr2 DD. Mild LVH. Ao sclerosis w/o stenosis. Mild MR. Mild to mod LAE. Nl RV fxn. Mild RAE. Mod TR. PASP 60-60mmHg.  Marland Kitchen Anxiety   . Breast cancer ([place]) 1997   a. 1997 s/p right breast mastectomy  . Chest pain    a. 11/2008 MV: No ischemia. EF 66%.  . CKD (chronic kidney disease) stage 4, GFR 15-29 ml/min (HCC)   . Coronary artery calcification seen on CT scan    a. 01/2017 Chest CT: Atherosclerotic coronary Ca2+.  . Diabetes mellitus without complication ([place])   . DJD (degenerative joint disease)   . Dysuria   . GIB (gastrointestinal bleeding)    a. 07/2018 EGD: Gastritis w/ hemorrhage, duodenal angioectasia. Friable gastric mucosa w/ bleeding on contact. All treated w/ argon plasma coagulation; b. 07/2018 s/p 2u PRBCs.  . H/O total knee replacement    right  .  HTN (hypertension)   . Hypercholesteremia   . Kidney stone   . Obesity   . Recurrent urinary tract infection   . SVT (supraventricular tachycardia) ([place])   . Urinary incontinence   . Vaginal atrophy   . Yeast vaginitis     Past Surgical History:  Procedure Laterality Date  . CARDIAC CATHETERIZATION    . CHOLECYSTECTOMY    . COLONOSCOPY N/A 08/06/2018   Procedure: COLONOSCOPY;  Surgeon: Virgel Manifold, MD;  Location: [facility] ENDOSCOPY;  Service: Endoscopy;  Laterality: N/A;  . DIALYSIS/PERMA CATHETER INSERTION N/A 03/16/2019   Procedure: DIALYSIS/PERMA CATHETER INSERTION;  Surgeon: Algernon Huxley, MD;  Location: [place] [facility] LAB;  Service: Cardiovascular;  Laterality: N/A;  . DIALYSIS/PERMA CATHETER INSERTION N/A 05/29/2019   Procedure: DIALYSIS/PERMA CATHETER INSERTION;  Surgeon: Katha Cabal, MD;  Location: [place] [facility];  Service: Cardiovascular;  Laterality: N/A;  . ESOPHAGOGASTRODUODENOSCOPY N/A 08/06/2018   Procedure: ESOPHAGOGASTRODUODENOSCOPY (EGD);  Surgeon: Virgel Manifold, MD;  Location: [facility] ENDOSCOPY;  Service: Endoscopy;  Laterality: N/A;  . FOOT SURGERY    . JOINT REPLACEMENT    . MASTECTOMY Right 1997  . NASAL SEPTUM SURGERY    . PERIPHERAL VASCULAR CATHETERIZATION N/A 07/08/2015   Procedure: PICC Line Insertion;  Surgeon: Algernon Huxley, MD;  Location: [place] [facility];  Service: Cardiovascular;  Laterality: N/A;  .  TEMPORARY DIALYSIS CATHETER N/A 03/09/2019   Procedure: TEMPORARY DIALYSIS CATHETER INSERTION;  Surgeon: Algernon Huxley, MD;  Location: [place];  Service: Cardiovascular;  Laterality: N/A;  . TONSILLECTOMY    . Vocal cords      Family Hx:  Family History  Problem Relation Age of Onset  . Lung cancer Father   . Hematuria Mother   . Lung cancer Mother   . Kidney disease Neg Hx   . Bladder Cancer Neg Hx   . Breast cancer Neg Hx     Social History:  reports that she has quit smoking. She quit smokeless tobacco use  about 23 years ago. She reports that she does not drink alcohol or use drugs.  Allergies:  Allergies  Allergen Reactions  . Sulfa Antibiotics Hives  . Biaxin [Clarithromycin] Hives  . Buspar [Buspirone]   . Influenza A (H1n1) Monoval Vac Other (See Comments)    Pt states that she was told by her MD not to get the influenza vaccine.    . Morphine Other (See Comments)    Reaction:  Dizziness and confusion   . Pyridium [Phenazopyridine Hcl] Other (See Comments)    Reaction:  Unknown   . Tuberculin Tests   . Ceftriaxone Anxiety  . Latex Rash  . Prednisone Rash  . Tape Rash    Medications: Prior to Admission medications   Medication Sig Start Date End Date Taking? Authorizing Provider  acetaminophen (TYLENOL) 325 MG tablet Take 2 tablets (650 mg total) by mouth every 6 (six) hours as needed for mild pain (or Fever >/= 101). 04/26/18   Loletha Grayer, MD  albuterol (PROVENTIL HFA;VENTOLIN HFA) 108 (90 Base) MCG/ACT inhaler Inhale 2 puffs into the lungs every 6 (six) hours as needed for wheezing or shortness of breath. 07/14/18   Gouru, Illene Silver, MD  amoxicillin (AMOXIL) 500 MG capsule Take 2,000 mg by mouth as directed. Prior to dental procedure    [provider]  aspirin 81 MG chewable tablet Chew 1 tablet (81 mg total) by mouth daily. 09/11/19   Lavina Hamman, MD  atorvastatin (LIPITOR) 40 MG tablet Take 1 tablet (40 mg total) by mouth daily at 6 PM. Patient taking differently: Take 40 mg by mouth at bedtime.  03/19/19   Dustin Flock, MD  calcium acetate (PHOSLO) 667 MG capsule Take 1,334 mg by mouth 3 (three) times daily before meals.    [provider]  camphor-menthol (MEN-PHOR) lotion Apply 1 application topically every 6 (six) hours as needed for itching.    [provider]  citalopram (CELEXA) 10 MG tablet Take 1 tablet (10 mg total) by mouth daily. 02/04/17   Loletha Grayer, MD  clonazePAM (KLONOPIN) 0.25 MG disintegrating tablet Take 1 tablet (0.25 mg  total) by mouth every 12 (twelve) hours as needed (anxiety). 03/19/19   Dustin Flock, MD  docusate sodium (COLACE) 100 MG capsule Take 100 mg by mouth 2 (two) times daily.     [provider]  ferric citrate (AURYXIA) 1 GM 210 MG(Fe) tablet Take 420 mg by mouth 3 (three) times daily with meals.     [provider]  fluticasone (FLONASE) 50 MCG/ACT nasal spray Place 1 spray into both nostrils daily.     [provider]  glipiZIDE (GLUCOTROL) 5 MG tablet Take 5 mg by mouth daily before breakfast.    [provider]  heparin 25000-0.45 UT/250ML-% infusion Inject 1,350 Units/hr into the vein continuous. 09/11/19   Lavina Hamman,  MD  hydrALAZINE (APRESOLINE) 25 MG tablet Take 1 tablet (25 mg total) by mouth 3 (three) times daily with meals. 03/19/19   Dustin Flock, MD  insulin aspart (NOVOLOG) 100 UNIT/ML injection 3 (three) times daily before meals. Inject per sliding scale 0-199=0, 200-500=5 units, notify MD for blood sugar> or equal to 500    [provider]  insulin glargine (LANTUS) 100 UNIT/ML injection Inject 0.4 mLs (40 Units total) into the skin daily. Patient taking differently: Inject 45 Units into the skin at bedtime.  03/19/19   Dustin Flock, MD  iron polysaccharides (NIFEREX) 150 MG capsule Take 150 mg by mouth daily.    [provider]  lamoTRIgine (LAMICTAL) 25 MG tablet Take 1 tablet (25 mg total) by mouth daily. 04/26/18   Loletha Grayer, MD  levETIRAcetam (KEPPRA) 250 MG tablet Take 1 tablet (250 mg total) by mouth 2 (two) times daily. 10/26/16   Epifanio Lesches, MD  loratadine (CLARITIN) 10 MG tablet Take 10 mg by mouth daily.    [provider]  losartan (COZAAR) 50 MG tablet Take 50 mg by mouth daily.    [provider]  Melatonin 3 MG TABS Take 6 mg by mouth at bedtime.    [provider]  Meth-Hyo-M Bl-Na Phos-Ph Sal (URIBEL) 118 MG CAPS Take 118 mg by mouth every 6 (six) hours as needed (uti  symptoms).    [provider]  metoprolol tartrate (LOPRESSOR) 50 MG tablet Take 50 mg by mouth 2 (two) times daily.    [provider]  mirabegron ER (MYRBETRIQ) 25 MG TB24 tablet Take 25 mg by mouth daily.    [provider]  Multiple Vitamin (MULTIVITAMIN) tablet Take 1 tablet by mouth daily.    [provider]  nystatin (NYSTATIN) powder Apply 1 g topically See admin instructions. Apply to under breast and abdominal once daily & apply every 12 hours as needed for abdominal folds, groin, and under breast    [provider]  OLANZapine (ZYPREXA) 5 MG tablet Take 5 mg by mouth at bedtime.    [provider]  ondansetron (ZOFRAN-ODT) 8 MG disintegrating tablet Take 8 mg by mouth every 8 (eight) hours as needed for nausea/vomiting. 08/02/18   [provider]  oxyCODONE-acetaminophen (PERCOCET/ROXICET) 5-325 MG tablet Take 1 tablet by mouth every 6 (six) hours as needed for moderate pain or severe pain. Patient not taking: Reported on 09/10/2019 03/19/19   Dustin Flock, MD  pantoprazole (PROTONIX) 40 MG tablet Take 40 mg by mouth daily.     [provider]  polyethylene glycol (MIRALAX / GLYCOLAX) packet Take 17 g by mouth daily. Patient taking differently: Take 17 g by mouth as needed.  04/26/18   Loletha Grayer, MD  saccharomyces boulardii (FLORASTOR) 250 MG capsule Take 250 mg by mouth daily.    [provider]  sennosides-docusate sodium (SENOKOT-S) 8.6-50 MG tablet Take 1 tablet by mouth at bedtime.    [provider]  tiZANidine (ZANAFLEX) 2 MG tablet Take 2 mg by mouth every 8 (eight) hours as needed for muscle spasms.    [provider]  torsemide (DEMADEX) 20 MG tablet Take 2 tablets (40 mg total) by mouth daily. Patient taking differently: Take 20 mg by mouth daily.  07/15/18   Nicholes Mango, MD  vitamin B-12 (CYANOCOBALAMIN) 1000 MCG tablet Take 1,000 mcg by mouth daily.    [provider]    I have reviewed the patient's current medications.  Labs:  BMP Latest Ref Rng & Units 09/12/2019 09/11/2019 09/11/2019  Glucose 70 - 99 mg/dL 172(H) 195(H) 285(H)  BUN 8 - 23 mg/dL 41(H) 38(H) 34(H)  Creatinine 0.44 - 1.00 mg/dL 6.99(H) 6.90(H) 6.71(H)  BUN/Creat Ratio 12 - 28 - - -  Sodium 135 - 145 mmol/L 133(L) 135 133(L)  Potassium 3.5 - 5.1 mmol/L 3.8 3.9 3.7  Chloride 98 - 111 mmol/L 96(L) 98 96(L)  CO2 22 - 32 mmol/L 26 26 26   Calcium 8.9 - 10.3 mg/dL 9.1 9.2 8.3(L)    ROS:  Pertinent items noted in HPI and remainder of comprehensive ROS otherwise negative.   Physical Exam: Vitals:   09/12/19 1256 09/12/19 1313  BP: 125/71 135/79  Pulse: (!) 124 (!) 123  Resp: 15 18  Temp:    SpO2: 99% 100%     General: adult female in bed in NAD at rest  HEENT: NCAT Eyes: EOMI sclera anicteric Neck: trachea midline Heart: tachycardic irregular  Lungs: clear but reduced on exam; normal work of breathing on nasal cannula 3L at 100 % Abdomen: softly distended/obese habitus /nontender Extremities: obese habitus; no pitting edema  Skin: no rash on extremities exposed to me Neuro: alert and oriented; conversant  Access RIJ tunneled dialysis catheter   [facility]  - no answer at unit on 11/25 at 2:00 pm   Assessment/Plan: # ESRD  - HD today after TPA - Normally MWF schedule - Will contact unit for outpatient orders - no answer on initial attempt   # A flutter/A fib with RVR - Per cardiology   - on metoprolol   # Heart failure with preserved EF  - Volume management with HD  # Covid 19 infection  - therapies per primary team  - optimize volume status as able   # Anemia CKD  - Will obtain outpatient orders  - no indication for PRBC's  Claudia Desanctis 09/12/2019, 1:29 PM

## 2019-09-12 NOTE — Progress Notes (Signed)
  Echocardiogram 2D Echocardiogram limited has been performed.  Darlina Sicilian M 09/12/2019, 11:35 AM

## 2019-09-12 NOTE — Progress Notes (Signed)
Progress Note  Patient Name: Carolyn Valdez Date of Encounter: 09/12/2019  Primary Cardiologist: Kathlyn Sacramento, MD   Subjective   Unable to obtain a review of systems. I attempted to call into the room via E-link to do a video visit but pt was sleeping.   Inpatient Medications    Scheduled Meds: . aspirin  81 mg Oral Daily  . atorvastatin  40 mg Oral q1800  . calcium acetate  1,334 mg Oral TID AC  . Chlorhexidine Gluconate Cloth  6 each Topical Q0600  . citalopram  10 mg Oral Daily  . docusate sodium  100 mg Oral BID  . insulin aspart  0-9 Units Subcutaneous Q4H  . insulin glargine  25 Units Subcutaneous QHS  . lamoTRIgine  25 mg Oral Daily  . levETIRAcetam  250 mg Oral BID  . metoprolol tartrate  25 mg Oral Q6H  . mirabegron ER  25 mg Oral Daily  . OLANZapine  5 mg Oral QHS  . pantoprazole  40 mg Oral Daily  . senna  1 tablet Oral BID  . sodium chloride flush  3 mL Intravenous Q12H  . [START ON 09/13/2019] torsemide  40 mg Oral Once per day on Sun Tue Thu Sat  . vitamin C  500 mg Oral Daily  . zinc sulfate  220 mg Oral Daily   Continuous Infusions: . heparin 1,950 Units/hr (09/12/19 1109)   PRN Meds: acetaminophen, albuterol, chlorpheniramine-HYDROcodone, clonazePAM, guaiFENesin-dextromethorphan, ondansetron **OR** ondansetron (ZOFRAN) IV, oxyCODONE, polyethylene glycol   Vital Signs    Vitals:   09/12/19 0900 09/12/19 1000 09/12/19 1111 09/12/19 1256  BP: 135/73 (!) 109/54 117/71   Pulse: (!) 125 (!) 126 (!) 126 (!) (P) 124  Resp: (!) 21 (!) 22  (P) 15  Temp:      TempSrc:      SpO2: 99% 98%  (P) 99%  Weight:    (P) 125.5 kg  Height:        Intake/Output Summary (Last 24 hours) at 09/12/2019 1313 Last data filed at 09/12/2019 1000 Gross per 24 hour  Intake 176.56 ml  Output 150 ml  Net 26.56 ml   Last 3 Weights 09/12/2019 09/11/2019 09/10/2019  Weight (lbs) 276 lb 10.8 oz 274 lb 4 oz 260 lb  Weight (kg) 125.5 kg 124.4 kg 117.935 kg      Telemetry     Atrial flutter, rate 120-125 bpm - Personally Reviewed  ECG    No EKG today- Personally Reviewed  Physical Exam   Unable to perform exam due to patient being on Covid precautions  Labs    High Sensitivity Troponin:   Recent Labs  Lab 09/10/19 1329 09/10/19 1515  TROPONINIHS 2,336* 2,021*      Chemistry Recent Labs  Lab 09/11/19 0645 09/11/19 2340 09/12/19 1010  NA 133* 135 133*  K 3.7 3.9 3.8  CL 96* 98 96*  CO2 26 26 26   GLUCOSE 285* 195* 172*  BUN 34* 38* 41*  CREATININE 6.71* 6.90* 6.99*  CALCIUM 8.3* 9.2 9.1  PROT  --  6.4*  --   ALBUMIN  --  2.8* 2.7*  AST  --  16  --   ALT  --  13  --   ALKPHOS  --  73  --   BILITOT  --  1.0  --   GFRNONAA 5* 5* 5*  GFRAA 6* 6* 6*  ANIONGAP 11 11 11      Hematology Recent Labs  Lab 09/10/19 1329  09/11/19 0645 09/11/19 2340  WBC 12.6* 13.3* 10.3  RBC 3.79* 3.28* 3.40*  HGB 10.5* 9.1* 9.6*  HCT 34.2* 29.2* 31.4*  MCV 90.2 89.0 92.4  MCH 27.7 27.7 28.2  MCHC 30.7 31.2 30.6  RDW 15.1 14.9 15.0  PLT 462* 396 439*    BNP Recent Labs  Lab 09/10/19 1329  BNP 869.0*     DDimer  Recent Labs  Lab 09/11/19 2340  DDIMER 1.03*     Radiology    Portable Chest 1 View  Result Date: 09/12/2019 CLINICAL DATA:  Shortness of breath. EXAM: PORTABLE CHEST 1 VIEW COMPARISON:  09/10/2019. FINDINGS: Right-sided dialysis catheter in stable position. Stable cardiomegaly and mild pulmonary vascular congestion noted. Mild bibasilar atelectasis with improved aeration from prior exam. Small left pleural effusion cannot be excluded. Biapical pleural thickening again noted most consistent scarring. No pneumothorax. IMPRESSION: 1.  Right-sided dialysis catheter stable position. 2. Stable cardiomegaly and mild pulmonary vascular congestion noted. 3. Mild bibasilar atelectasis with improved aeration from prior exam. Small left pleural effusion cannot be excluded. Electronically Signed   By: Marcello Moores  Register   On: 09/12/2019 07:51    Dg Chest Port 1 View  Result Date: 09/10/2019 CLINICAL DATA:  78 year old female with shortness of breath. EXAM: PORTABLE CHEST 1 VIEW COMPARISON:  Chest radiograph dated 07/02/2019. FINDINGS: Right-sided dialysis catheter in similar position. There is stable cardiomegaly with mild vascular congestion. No edema. No focal consolidation or pneumothorax. Right apical subpleural crescentic density may represent scarring or trace loculated effusion. There is atherosclerotic calcification of the aortic arch. No acute osseous pathology. IMPRESSION: 1. Stable cardiomegaly with mild vascular congestion. No focal consolidation or pneumothorax. 2. Right apical subpleural crescentic density may represent trace pleural effusion. Electronically Signed   By: Anner Crete M.D.   On: 09/10/2019 13:48   Dg Abd Portable 1v  Result Date: 09/11/2019 CLINICAL DATA:  Abdominal pain and a COVID-19 positive patient. EXAM: PORTABLE ABDOMEN - 1 VIEW COMPARISON:  None. FINDINGS: The bowel gas pattern is normal. No radio-opaque calculi or other significant radiographic abnormality are seen. IMPRESSION: Negative exam. Electronically Signed   By: Inge Rise M.D.   On: 09/11/2019 09:53    Cardiac Studies   TTE (03/02/2019):   1. The left ventricle has low normal systolic function, with an ejection fraction of 50-55%. The cavity size was mildly dilated. There is mild concentric left ventricular hypertrophy. Left ventricular diastolic Doppler parameters are consistent with  pseudonormalization.  2. The right ventricle has normal systolic function. The cavity was normal. There is no increase in right ventricular wall thickness. Right ventricular systolic pressure is severely elevated with an estimated pressure of 72.7 mmHg.  3. Left atrial size was mildly dilated.  4. Right atrial size was mildly dilated.  5. The mitral valve is grossly normal. There is moderate mitral annular calcification present. Mild mitral valve  stenosis.  6. The tricuspid valve is grossly normal. Tricuspid valve regurgitation is moderate.  7. The aortic valve is grossly normal. Aortic valve regurgitation was not assessed by color flow Doppler.   Patient Profile     78 y.o. female with history of chronic HFpEF, pulmonary hypertension, GI bleed (07/2018), hypertension, hyperlipidemia, diabetes mellitus, obesity, and ESRD on hemodialysis who was admitted to [facility] with dyspnea, fatigue and palpitations, not tolerating HD. She was found to have atrial flutter with RVR and mildly elevated troponin. She tested positive for Covid 19.  She was seen [facility] by Dr. Fletcher Anon and Dr. Saunders Revel and  placed continued on IV heparin. She has been rate controlled with metoprolol.   Assessment & Plan    1. Atrial flutter with RVR: Heart rates still in the 120s. She remains on IV heparin and metoprolol 25 mg po every six hours. Ultimately, the patient may need TEE guided cardioversion if rate control remains difficult. We would like to avoid this if possible due to Covid 19 and high risk of recurrent atrial flutter. If her heart rate increases, would need to consider IV amiodarone but I would prefer we avoid this as we do not know how long she has been in atrial flutter. Titrate beta blocker dose as BP tolerates for rate control.   2. Elevated troponin: Initial troponin elevated at 2336, with repeat 2 hours later downtrending at 2021.  This is felt to reflect supply-demand mismatch in the setting of Covid 19 infection. She may ultimately need a cardiac cath but we will delay this for now. Continue IV heparin and ASA. Echo pending today. (her first Covid positive test was 08/22/19 per records. Also positive on 09/10/19. If she is Covid negative on f/u testing in several days, can consider a cardiac cath then).   3. Acute on chronic HFpEF: Dyspnea is likely multifactorial with some element of pulmonary edema possible in the setting of atrial flutter.  Pneumonitis related to  COVID-19 is also possible. Volume removal with HD.   4. End-stage renal disease: Hemodialysis per nephrology.    We will follow along with you.    For questions or updates, please contact [place] Please consult [email or web address] for contact info under [facility] Cardiology.     Signed, Lauree Chandler, MD  09/12/2019, 1:13 PM

## 2019-09-12 NOTE — Progress Notes (Signed)
ANTICOAGULATION CONSULT NOTE - Follow Up Consult  Pharmacy Consult for heparin Indication: chest pain/ACS and atrial fibrillation  Allergies  Allergen Reactions  . Sulfa Antibiotics Hives  . Biaxin [Clarithromycin] Hives  . Buspar [Buspirone]   . Influenza A (H1n1) Monoval Vac Other (See Comments)    Pt states that she was told by her MD not to get the influenza vaccine.    . Morphine Other (See Comments)    Reaction:  Dizziness and confusion   . Pyridium [Phenazopyridine Hcl] Other (See Comments)    Reaction:  Unknown   . Tuberculin Tests   . Ceftriaxone Anxiety  . Latex Rash  . Prednisone Rash  . Tape Rash    Patient Measurements: Height: 5\' 7"  (170.2 cm) Weight: 274 lb 4 oz (124.4 kg) IBW/kg (Calculated) : 61.6 Heparin Dosing Weight: 91 kg  Vital Signs: Temp: 98.5 F (36.9 C) (11/25 0400) Temp Source: Axillary (11/25 0400) BP: 134/68 (11/25 0800) Pulse Rate: 124 (11/25 0800)  Labs: Recent Labs    09/10/19 1329 09/10/19 1515  09/11/19 0645 09/11/19 1627 09/11/19 2340 09/12/19 0904  HGB 10.5*  --   --  9.1*  --  9.6*  --   HCT 34.2*  --   --  29.2*  --  31.4*  --   PLT 462*  --   --  396  --  439*  --   APTT  --  35  --   --   --   --   --   LABPROT  --  13.9  --   --   --   --   --   INR  --  1.1  --   --   --   --   --   HEPARINUNFRC  --   --    < > <0.10* 0.24* 0.24* 0.19*  CREATININE 5.81*  --   --  6.71*  --  6.90*  --   TROPONINIHS 2,336* 2,021*  --   --   --   --   --    < > = values in this interval not displayed.    Estimated Creatinine Clearance: 9.5 mL/min (A) (by C-G formula based on SCr of 6.9 mg/dL (H)).   Medications:  Scheduled:  . aspirin  81 mg Oral Daily  . atorvastatin  40 mg Oral q1800  . calcium acetate  1,334 mg Oral TID AC  . Chlorhexidine Gluconate Cloth  6 each Topical Q0600  . citalopram  10 mg Oral Daily  . docusate sodium  100 mg Oral BID  . insulin aspart  0-9 Units Subcutaneous Q4H  . insulin glargine  25 Units  Subcutaneous QHS  . lamoTRIgine  25 mg Oral Daily  . levETIRAcetam  250 mg Oral BID  . metoprolol tartrate  25 mg Oral Q6H  . mirabegron ER  25 mg Oral Daily  . OLANZapine  5 mg Oral QHS  . pantoprazole  40 mg Oral Daily  . senna  1 tablet Oral BID  . sodium chloride flush  3 mL Intravenous Q12H  . [START ON 09/13/2019] torsemide  40 mg Oral Once per day on Sun Tue Thu Sat  . vitamin C  500 mg Oral Daily  . zinc sulfate  220 mg Oral Daily    Assessment: 42 yof admitted with atrial fibrillation and NSTEMI from [facility]. No anticoagulation PTA. Her CHADSVASC score is 5. Positive troponins on admission with a peak of 2336. Of note, she  was COVID + at her ALF 11/4. Per cardiology, a cardioversion and cardiac cath may be planned in the future.   Heparin level this AM is sub-therapeutic at 0.19 on 1700 units/hour and s/p bolus of 1500 units. No infusion issues per nursing. Her H&H is stable at 9.6/31.4, plts are elevated at 439.    Goal of Therapy:  Heparin level 0.3-0.7 units/ml Monitor platelets by anticoagulation protocol: Yes   Plan:  Give 2500 units bolus x 1 Increase heparin infusion at 1950 units/hr Check anti-Xa level in 6 hours and daily while on heparin Continue to monitor H&H and platelets  F/u VTE work up and any procedures   Thank you,   Eddie Candle, PharmD PGY-1 Pharmacy Resident   Please check amion for clinical pharmacist contact number   09/12/2019,10:19 AM

## 2019-09-12 NOTE — Progress Notes (Signed)
[facility] for heparin  Indication: chest pain/ACS  Allergies  Allergen Reactions  . Sulfa Antibiotics Hives  . Biaxin [Clarithromycin] Hives  . Buspar [Buspirone]   . Influenza A (H1n1) Monoval Vac Other (See Comments)    Pt states that she was told by her MD not to get the influenza vaccine.    . Morphine Other (See Comments)    Reaction:  Dizziness and confusion   . Pyridium [Phenazopyridine Hcl] Other (See Comments)    Reaction:  Unknown   . Tuberculin Tests   . Ceftriaxone Anxiety  . Latex Rash  . Prednisone Rash  . Tape Rash    Patient Measurements: Height: 5\' 7"  (170.2 cm) Weight: 274 lb 4 oz (124.4 kg) IBW/kg (Calculated) : 61.6 Heparin Dosing Weight: 89kg  Vital Signs: Temp: 98.3 F (36.8 C) (11/25 0000) Temp Source: Axillary (11/25 0000) BP: 136/77 (11/25 0000) Pulse Rate: 125 (11/25 0000)  Labs: Recent Labs    09/10/19 1329 09/10/19 1515  09/11/19 0645 09/11/19 1627 09/11/19 2340  HGB 10.5*  --   --  9.1*  --  9.6*  HCT 34.2*  --   --  29.2*  --  31.4*  PLT 462*  --   --  396  --  439*  APTT  --  35  --   --   --   --   LABPROT  --  13.9  --   --   --   --   INR  --  1.1  --   --   --   --   HEPARINUNFRC  --   --    < > <0.10* 0.24* 0.24*  CREATININE 5.81*  --   --  6.71*  --   --   TROPONINIHS 2,336* 2,021*  --   --   --   --    < > = values in this interval not displayed.    Estimated Creatinine Clearance: 9.8 mL/min (A) (by C-G formula based on SCr of 6.71 mg/dL (H)).  Assessment: 78 yo female with aflutter/possible ACS for heparin  Goal of Therapy:  Heparin level 0.3-0.7 units/ml Monitor platelets by anticoagulation protocol: Yes   Plan:  Increase Heparin 1700 units/hr Follow-up am labs.  Phillis Knack, PharmD, BCPS

## 2019-09-12 NOTE — Progress Notes (Addendum)
ANTICOAGULATION CONSULT NOTE - Follow Up Consult  Pharmacy Consult for heparin Indication: chest pain/ACS and atrial fibrillation  Allergies  Allergen Reactions  . Sulfa Antibiotics Hives  . Biaxin [Clarithromycin] Hives  . Buspar [Buspirone]   . Influenza A (H1n1) Monoval Vac Other (See Comments)    Pt states that she was told by her MD not to get the influenza vaccine.    . Morphine Other (See Comments)    Reaction:  Dizziness and confusion   . Pyridium [Phenazopyridine Hcl] Other (See Comments)    Reaction:  Unknown   . Tuberculin Tests   . Ceftriaxone Anxiety  . Latex Rash  . Prednisone Rash  . Tape Rash    Patient Measurements: Height: 5\' 7"  (170.2 cm) Weight: 271 lb 2.7 oz (123 kg) IBW/kg (Calculated) : 61.6 Heparin Dosing Weight: 91 kg  Vital Signs: Temp: 98.4 F (36.9 C) (11/25 1644) Temp Source: Oral (11/25 1644) BP: 109/54 (11/25 2100) Pulse Rate: 127 (11/25 2100)  Labs: Recent Labs    09/10/19 1329 09/10/19 1515  09/11/19 0645  09/11/19 2340 09/12/19 0904 09/12/19 1010 09/12/19 1850  HGB 10.5*  --   --  9.1*  --  9.6*  --   --   --   HCT 34.2*  --   --  29.2*  --  31.4*  --   --   --   PLT 462*  --   --  396  --  439*  --   --   --   APTT  --  35  --   --   --   --   --   --   --   LABPROT  --  13.9  --   --   --   --   --   --   --   INR  --  1.1  --   --   --   --   --   --   --   HEPARINUNFRC  --   --    < > <0.10*   < > 0.24* 0.19*  --  0.42  CREATININE 5.81*  --   --  6.71*  --  6.90*  --  6.99*  --   TROPONINIHS 2,336* 2,021*  --   --   --   --   --   --   --    < > = values in this interval not displayed.    Estimated Creatinine Clearance: 9.3 mL/min (A) (by C-G formula based on SCr of 6.99 mg/dL (H)).   Assessment: 78 yr old female admitted with atrial fibrillation and NSTEMI from [facility]. No anticoagulation PTA. Her CHADSVASC score is 5. Positive troponins on admission with a peak of 2336. Of note, she was COVID + at her ALF 11/4. Per  cardiology, a cardioversion and cardiac cath may be planned in the future.   Heparin level drawn ~8 hrs after heparin 2500 units IV bolus, followed by increasing heparin infusion to 1950 units/hr was 0.42 units/ml, which is within the goal range for this patient. H/H is stable at 9.6/31.4, plts are elevated at 439. Per RN, no issues with IV or bleeding observed.   Goal of Therapy:  Heparin level 0.3-0.7 units/ml Monitor platelets by anticoagulation protocol: Yes   Plan:  Continue heparin infusion at 1950 units/hr Check 8-hr confirmatory heparin level Monitor daily heparin level, CBC Monitor for signs/symptoms of bleeding F/U VTE work up and any procedures   Diane  Nikki Dom, PharmD, BCPS, [facility] Clinical Pharmacist 09/12/2019,9:22 PM

## 2019-09-12 NOTE — Progress Notes (Signed)
Inpatient Diabetes Program Recommendations  AACE/ADA: New Consensus Statement on Inpatient Glycemic Control (2015)  Target Ranges:  Prepandial:   less than 140 mg/dL      Peak postprandial:   less than 180 mg/dL (1-2 hours)      Critically ill patients:  140 - 180 mg/dL   Lab Results  Component Value Date   GLUCAP 180 (H) 09/12/2019   HGBA1C 7.9 (H) 09/10/2019    Review of Glycemic Control Results for KENLY, HENCKEL (MRN [id number]) as of 09/12/2019 12:46  Ref. Range 09/12/2019 04:24 09/12/2019 08:42 09/12/2019 11:21  Glucose-Capillary Latest Ref Range: 70 - 99 mg/dL 154 (H) 150 (H) 180 (H)   Diabetes history: DM 2 Outpatient Diabetes medications:  Glipizide 5 mg daily, Novolog 0-5 units tid, Lantus 45 units q HS Current orders for Inpatient glycemic control:  Lantus 25 units q HS, Novolog sensitive q 4 hours  Inpatient Diabetes Program Recommendations:    Blood sugars within hospital goals.  Has not received Lantus yet.  Consider reducing Lantus to 10 units q HS.    Thanks  Adah Perl, RN, BC-ADM Inpatient Diabetes Coordinator Pager [phone] (8a-5p)

## 2019-09-13 DIAGNOSIS — U071 COVID-19: Secondary | ICD-10-CM

## 2019-09-13 DIAGNOSIS — J988 Other specified respiratory disorders: Secondary | ICD-10-CM

## 2019-09-13 LAB — COMPREHENSIVE METABOLIC PANEL
ALT: 11 U/L (ref 0–44)
AST: 15 U/L (ref 15–41)
Albumin: 2.7 g/dL — ABNORMAL LOW (ref 3.5–5.0)
Alkaline Phosphatase: 70 U/L (ref 38–126)
Anion gap: 16 — ABNORMAL HIGH (ref 5–15)
BUN: 23 mg/dL (ref 8–23)
CO2: 23 mmol/L (ref 22–32)
Calcium: 9 mg/dL (ref 8.9–10.3)
Chloride: 98 mmol/L (ref 98–111)
Creatinine, Ser: 5.25 mg/dL — ABNORMAL HIGH (ref 0.44–1.00)
GFR calc Af Amer: 9 mL/min — ABNORMAL LOW (ref >60)
GFR calc non Af Amer: 7 mL/min — ABNORMAL LOW (ref >60)
Glucose, Bld: 195 mg/dL — ABNORMAL HIGH (ref 70–99)
Potassium: 4.1 mmol/L (ref 3.5–5.1)
Sodium: 137 mmol/L (ref 135–145)
Total Bilirubin: 0.8 mg/dL (ref 0.3–1.2)
Total Protein: 6.4 g/dL — ABNORMAL LOW (ref 6.5–8.1)

## 2019-09-13 LAB — CBC WITH DIFFERENTIAL/PLATELET
Abs Immature Granulocytes: 0.14 10*3/uL — ABNORMAL HIGH (ref 0.00–0.07)
Basophils Absolute: 0 10*3/uL (ref 0.0–0.1)
Basophils Relative: 1 %
Eosinophils Absolute: 0.2 10*3/uL (ref 0.0–0.5)
Eosinophils Relative: 2 %
HCT: 32.7 % — ABNORMAL LOW (ref 36.0–46.0)
Hemoglobin: 10.1 g/dL — ABNORMAL LOW (ref 12.0–15.0)
Immature Granulocytes: 2 %
Lymphocytes Relative: 22 %
Lymphs Abs: 1.8 10*3/uL (ref 0.7–4.0)
MCH: 28.8 pg (ref 26.0–34.0)
MCHC: 30.9 g/dL (ref 30.0–36.0)
MCV: 93.2 fL (ref 80.0–100.0)
Monocytes Absolute: 1.2 10*3/uL — ABNORMAL HIGH (ref 0.1–1.0)
Monocytes Relative: 14 %
Neutro Abs: 4.8 10*3/uL (ref 1.7–7.7)
Neutrophils Relative %: 59 %
Platelets: 371 10*3/uL (ref 150–400)
RBC: 3.51 MIL/uL — ABNORMAL LOW (ref 3.87–5.11)
RDW: 15.4 % (ref 11.5–15.5)
WBC: 8.1 10*3/uL (ref 4.0–10.5)
nRBC: 0 % (ref 0.0–0.2)

## 2019-09-13 LAB — PHOSPHORUS: Phosphorus: 3.1 mg/dL (ref 2.5–4.6)

## 2019-09-13 LAB — GLUCOSE, CAPILLARY
Glucose-Capillary: 190 mg/dL — ABNORMAL HIGH (ref 70–99)
Glucose-Capillary: 190 mg/dL — ABNORMAL HIGH (ref 70–99)
Glucose-Capillary: 251 mg/dL — ABNORMAL HIGH (ref 70–99)
Glucose-Capillary: 311 mg/dL — ABNORMAL HIGH (ref 70–99)
Glucose-Capillary: 375 mg/dL — ABNORMAL HIGH (ref 70–99)

## 2019-09-13 LAB — MAGNESIUM: Magnesium: 2 mg/dL (ref 1.7–2.4)

## 2019-09-13 LAB — D-DIMER, QUANTITATIVE: D-Dimer, Quant: 1.43 ug/mL-FEU — ABNORMAL HIGH (ref 0.00–0.50)

## 2019-09-13 LAB — HEPARIN LEVEL (UNFRACTIONATED): Heparin Unfractionated: 0.4 IU/mL (ref 0.30–0.70)

## 2019-09-13 MED ORDER — SODIUM CHLORIDE 0.9 % IV SOLN
100.0000 mg | INTRAVENOUS | Status: AC
  Administered 2019-09-14 – 2019-09-17 (×4): 100 mg via INTRAVENOUS
  Filled 2019-09-13 (×2): qty 20
  Filled 2019-09-13: qty 100
  Filled 2019-09-13: qty 20

## 2019-09-13 MED ORDER — DEXAMETHASONE 6 MG PO TABS
6.0000 mg | ORAL_TABLET | Freq: Every day | ORAL | Status: DC
  Administered 2019-09-13 – 2019-09-21 (×8): 6 mg via ORAL
  Filled 2019-09-13 (×10): qty 1

## 2019-09-13 MED ORDER — ORAL CARE MOUTH RINSE
15.0000 mL | Freq: Two times a day (BID) | OROMUCOSAL | Status: DC
  Administered 2019-09-13 – 2019-10-02 (×36): 15 mL via OROMUCOSAL

## 2019-09-13 MED ORDER — METOPROLOL TARTRATE 12.5 MG HALF TABLET
37.5000 mg | ORAL_TABLET | Freq: Four times a day (QID) | ORAL | Status: DC
  Administered 2019-09-13 – 2019-09-15 (×8): 37.5 mg via ORAL
  Filled 2019-09-13 (×8): qty 3

## 2019-09-13 MED ORDER — PROMETHAZINE HCL 25 MG/ML IJ SOLN
12.5000 mg | Freq: Once | INTRAMUSCULAR | Status: AC
  Administered 2019-09-13: 12.5 mg via INTRAVENOUS
  Filled 2019-09-13: qty 1

## 2019-09-13 MED ORDER — INSULIN ASPART 100 UNIT/ML ~~LOC~~ SOLN
0.0000 [IU] | SUBCUTANEOUS | Status: DC
  Administered 2019-09-13: 8 [IU] via SUBCUTANEOUS
  Administered 2019-09-14: 15 [IU] via SUBCUTANEOUS
  Administered 2019-09-14: 8 [IU] via SUBCUTANEOUS
  Administered 2019-09-14: 5 [IU] via SUBCUTANEOUS
  Administered 2019-09-14 – 2019-09-15 (×2): 8 [IU] via SUBCUTANEOUS
  Administered 2019-09-15: 11 [IU] via SUBCUTANEOUS
  Administered 2019-09-15: 8 [IU] via SUBCUTANEOUS

## 2019-09-13 MED ORDER — INSULIN GLARGINE 100 UNIT/ML ~~LOC~~ SOLN
5.0000 [IU] | Freq: Once | SUBCUTANEOUS | Status: AC
  Administered 2019-09-13: 5 [IU] via SUBCUTANEOUS
  Filled 2019-09-13: qty 0.05

## 2019-09-13 MED ORDER — CHLORHEXIDINE GLUCONATE CLOTH 2 % EX PADS
6.0000 | MEDICATED_PAD | Freq: Every day | CUTANEOUS | Status: DC
  Administered 2019-09-15 – 2019-09-16 (×2): 6 via TOPICAL

## 2019-09-13 MED ORDER — SODIUM CHLORIDE 0.9 % IV SOLN
200.0000 mg | Freq: Once | INTRAVENOUS | Status: AC
  Administered 2019-09-13: 200 mg via INTRAVENOUS
  Filled 2019-09-13: qty 40

## 2019-09-13 NOTE — Progress Notes (Signed)
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  She initially presented to ED from dialysis where she was hypotensive and tachycardic.  Dialysis session had to be stopped early.  She was diagnosed with Covid 19 on 11 4 at ALF.  She did not have similar symptoms in the been out of hospital.  She denied chest pain.  At the time of presentation to [facility], she had troponin elevation of 2336, global ST depression, Tuesday 1 atrial flutter on EKG.  She was also in atrial flutter with RVR, cardiology was consulted for non-STEMI and atrial flutter.  She was started on heparin infusion with beta-blockers.  Plan was to pursue cardiac catheterization however she became hypotensive in [place] ED and was subsequently stabilized, blood pressure was stabilized and patient transferred to [facility].    Subjective   Patient seen and examined, complains of nausea.  Currently in normal sinus rhythm, earlier she was in atrial flutter.   Assessment/Plan:     1. Non-STEMI/HFpEF-patient had troponin elevation to 2336 with EKG changes.  Cardiology at [facility] started patient on heparin.  Continue aspirin, statin, beta-blockers.  There was a plan for cardiac catheterization.  Cardiology following, no plans for cardiac cath at this time due to Covid infection.  Continue medical management.  2. Atrial flutter with RVR-patient going in and out of flutter, currently in normal sinus rhythm.  Continue heparin protocol, metoprolol 25 mg p.o. every 6 hours.  3. ESRD on hemodialysis MWF-nephrology has  been consulted and patient undergoing hemodialysis in the hospital.   [place]    09/11/19 1627 09/11/19 2340 09/12/19 1850 09/13/19 0104  DDIMER  --  1.03*  --  1.43*  FERRITIN 392*  --  465*  --   CRP 11.7*  --  14.3*  --    COVID-19 infection-patient has been tested positive for COVID-19, she is currently asymptomatic.  Chest x-ray does not show infectious process.  Patient did have elevated CRP, 11.7, ferritin 392 at the time of admission.  Repeat Covid labs show CRP 14.3, ferritin 465.  Will start remdesivir per pharmacy, Decadron 6 mg p.o. daily.  4. Hypertension-blood pressure stable, continue metoprolol.  5. Diabetes mellitus-glucose is fairly well controlled, continue Lantus 25 units subcu daily, sliding scale insulin with NovoLog.  6. History of seizure disorder-continue Keppra.  7. Nausea and vomiting-patient did get Zofran this morning, no relief.  Will give 1 dose of Phenergan 12.5 mg IV.     CBG: Recent Labs  Lab 09/12/19 0424 09/12/19 0842 09/12/19 1121 09/12/19 1526 09/12/19 2007  GLUCAP 154* 150* 180* 153* 141*    CBC: Recent Labs  Lab 09/10/19 1329 09/11/19 0645 09/11/19 2340 09/13/19 0104  WBC 12.6* 13.3* 10.3 8.1  NEUTROABS  --   --  6.8 4.8  HGB 10.5* 9.1* 9.6* 10.1*  HCT 34.2* 29.2* 31.4* 32.7*  MCV 90.2 89.0 92.4 93.2  PLT 462* 396 439* 841    Basic Metabolic Panel: Recent Labs  Lab 09/10/19 1329 09/11/19 0645 09/11/19 2340 09/12/19 1010 09/13/19 0104  NA 135 133* 135 133* 137  K 3.7 3.7 3.9 3.8  4.1  CL 94* 96* 98 96* 98  CO2 26 26 26 26 23   GLUCOSE 281* 285* 195* 172* 195*  BUN 26* 34* 38* 41* 23  CREATININE 5.81* 6.71* 6.90* 6.99* 5.25*  CALCIUM 9.0 8.3* 9.2 9.1 9.0  MG  --   --  2.0  --  2.0  PHOS 2.8  --  3.5 4.0 3.1    COVID-19 Labs  Recent Labs    09/11/19 1627 09/11/19 2340 09/12/19 1850 09/13/19 0104  DDIMER  --  1.03*  --  1.43*  FERRITIN 392*  --  465*  --   CRP 11.7*  --  14.3*  --      Lab Results  Component Value Date   SARSCOV2NAA POSITIVE (A) 09/10/2019   [place] NEGATIVE 07/04/2019   [place] NEGATIVE 07/02/2019   [place] NEGATIVE 05/29/2019    DVT prophylaxis: Heparin  Code Status: Full code  Family Communication: No family at bedside  Disposition Plan: likely home when medically ready for discharge Pressure Injury 09/12/19 Sacrum Medial Stage I -  Intact skin with non-blanchable redness of a localized area usually over a bony prominence. (Active)  09/12/19 0010  Location: Sacrum  Location Orientation: Medial  Staging: Stage I -  Intact skin with non-blanchable redness of a localized area usually over a bony prominence.  Wound Description (Comments):   Present on Admission: Yes     BMI  Estimated body mass index is 42.47 kg/m as calculated from the following:   Height as of this encounter: 5\' 7"  (1.702 m).   Weight as of this encounter: 123 kg.  Scheduled medications:  . aspirin  81 mg Oral Daily  . atorvastatin  40 mg Oral q1800  . calcium acetate  1,334 mg Oral TID AC  . Chlorhexidine Gluconate Cloth  6 each Topical Q0600  . citalopram  10 mg Oral Daily  . dexamethasone  6 mg Oral Daily  . docusate sodium  100 mg Oral BID  . insulin aspart  0-9 Units Subcutaneous Q4H  . insulin glargine  25 Units Subcutaneous QHS  . lamoTRIgine  25 mg Oral Daily  . levETIRAcetam  250 mg Oral BID  . metoprolol tartrate  37.5 mg Oral Q6H  . mirabegron ER  25 mg Oral Daily  . OLANZapine  5 mg Oral QHS  . pantoprazole  40 mg Oral Daily  . promethazine  12.5 mg Intravenous Once  . senna  1 tablet Oral BID  . sodium chloride flush  3 mL Intravenous Q12H  . torsemide  40 mg Oral Once per day on Sun Tue Thu Sat  . vitamin C  500 mg Oral Daily  . zinc sulfate  220 mg Oral Daily    Consultants:  Cardiology  Nephrology  Procedures:     Antibiotics:   Anti-infectives (From admission, onward)   Start     Dose/Rate Route Frequency  Ordered Stop   09/14/19 1000  remdesivir 100 mg in sodium chloride 0.9 % 250 mL IVPB     100 mg 500 mL/hr over 30 Minutes Intravenous Every 24 hours 09/13/19 0858 09/18/19 0959   09/13/19 1000  remdesivir 200 mg in sodium chloride 0.9 % 250 mL IVPB     200 mg 500 mL/hr over 30 Minutes Intravenous Once 09/13/19 0858     09/12/19 2217  remdesivir 100 mg in sodium chloride 0.9 % 250 mL IVPB  Status:  Discontinued     100 mg 500 mL/hr over 30 Minutes Intravenous  Every 24 hours 09/11/19 2224 09/11/19 2252   09/11/19 2230  remdesivir 200 mg in sodium chloride 0.9 % 250 mL IVPB  Status:  Discontinued     200 mg 500 mL/hr over 30 Minutes Intravenous Once 09/11/19 2224 09/11/19 2252       Objective   Vitals:   09/13/19 0700 09/13/19 0800 09/13/19 0900 09/13/19 1000  BP: (!) 105/54 (!) 97/48 126/63 (!) 113/57  Pulse: (!) 123 (!) 123 (!) 123 (!) 122  Resp: (!) 22 (!) 22 19 (!) 32  Temp:  97.9 F (36.6 C)    TempSrc:  Oral    SpO2: 99% 99% 98% 98%  Weight:      Height:        Intake/Output Summary (Last 24 hours) at 09/13/2019 1052 Last data filed at 09/13/2019 1000 Gross per 24 hour  Intake 599.25 ml  Output 1500 ml  Net -900.75 ml   Filed Weights   09/11/19 2048 09/12/19 1256 09/12/19 1644  Weight: 124.4 kg 125.5 kg 123 kg     Physical Examination:   General-appears in no acute distress Heart-S1-S2, regular, no murmur auscultated Lungs-clear to auscultation bilaterally, no wheezing or crackles auscultated Abdomen-soft, nontender, no organomegaly Extremities-no edema in the lower extremities Neuro-alert, oriented x3, no focal deficit noted    Data Reviewed: I have personally reviewed following labs and imaging studies   Recent Results (from the past 240 hour(s))  SARS CORONAVIRUS 2 (TAT 6-24 HRS) Nasopharyngeal Nasopharyngeal Swab     Status: Abnormal   Collection Time: 09/10/19  1:24 PM   Specimen: Nasopharyngeal Swab  Result Value Ref Range Status   SARS  Coronavirus 2 POSITIVE (A) NEGATIVE Final    Comment: RESULT CALLED TO, READ BACK BY AND VERIFIED WITH: M.JOHNSON RN 2022 09/10/2019 MCCORMICK K (NOTE) SARS-CoV-2 target nucleic acids are DETECTED. The SARS-CoV-2 RNA is generally detectable in upper and lower respiratory specimens during the acute phase of infection. Positive results are indicative of active infection with SARS-CoV-2. Clinical  correlation with patient history and other diagnostic information is necessary to determine patient infection status. Positive results do  not rule out bacterial infection or co-infection with other viruses. The expected result is Negative. Fact Sheet for Patients: SugarRoll.be Fact Sheet for Healthcare Providers: https://www.woods-mathews.com/ This test is not yet approved or cleared by the [place] FDA and  has been authorized for detection and/or diagnosis of SARS-CoV-2 by FDA under an Emergency Use Authorization (EUA). This EUA will remain  in effect (meaning this test can be used)  for the duration of the COVID-19 declaration under Section 564(b)(1) of the Act, 21 U.S.C. section 360bbb-3(b)(1), unless the authorization is terminated or revoked sooner. Performed at [facility], [place] [address], [place], [place] [address]   MRSA PCR Screening     Status: None   Collection Time: 09/11/19  9:01 PM   Specimen: Nasopharyngeal  Result Value Ref Range Status   MRSA by PCR NEGATIVE NEGATIVE Final    Comment:        The GeneXpert MRSA Assay (FDA approved for NASAL specimens only), is one component of a comprehensive MRSA colonization surveillance program. It is not intended to diagnose MRSA infection nor to guide or monitor treatment for MRSA infections. Performed at [facility], [place] [address] [place],  [address]      Liver Function Tests: Recent Labs  Lab 09/11/19 2340 09/12/19 1010 09/13/19 0104  AST 16  --  15   ALT  13  --  11  ALKPHOS 73  --  70  BILITOT 1.0  --  0.8  PROT 6.4*  --  6.4*  ALBUMIN 2.8* 2.7* 2.7*   No results for input(s): LIPASE, AMYLASE in the last 168 hours. No results for input(s): AMMONIA in the last 168 hours.  Cardiac Enzymes: No results for input(s): CKTOTAL, CKMB, CKMBINDEX, TROPONINI in the last 168 hours. BNP (last 3 results) Recent Labs    03/02/19 0530 07/02/19 1734 09/10/19 1329  BNP 867.0* 97.0 869.0*    ProBNP (last 3 results) No results for input(s): PROBNP in the last 8760 hours.    Studies: Portable Chest 1 View  Result Date: 09/12/2019 CLINICAL DATA:  Shortness of breath. EXAM: PORTABLE CHEST 1 VIEW COMPARISON:  09/10/2019. FINDINGS: Right-sided dialysis catheter in stable position. Stable cardiomegaly and mild pulmonary vascular congestion noted. Mild bibasilar atelectasis with improved aeration from prior exam. Small left pleural effusion cannot be excluded. Biapical pleural thickening again noted most consistent scarring. No pneumothorax. IMPRESSION: 1.  Right-sided dialysis catheter stable position. 2. Stable cardiomegaly and mild pulmonary vascular congestion noted. 3. Mild bibasilar atelectasis with improved aeration from prior exam. Small left pleural effusion cannot be excluded. Electronically Signed   By: Marcello Moores  Register   On: 09/12/2019 07:51     Admission status: Inpatient: Based on patients clinical presentation and evaluation of above clinical data, I have made determination that patient meets Inpatient criteria at this time.   [facility] Pager [phone]. If 7PM-7AM, please contact night-coverage at [email or web address], Office  [phone]  password [place]  09/13/2019, 10:52 AM  LOS: 2 days

## 2019-09-13 NOTE — Progress Notes (Signed)
[facility] Progress Note  Name: ALANDRA SANDO MRN: [id number] DOB: 1941/05/08  Chief Complaint:  Hypotension at HD earlier this week  Subjective:  Had HD on 11/25 with 1.5 kg UF.  Catheter required TPA but did run after that.  Spoke with nursing - she has been on 3 liters oxygen and they are going to try to wean.   Review of systems:  Denies shortness of breath per nursing  She has reported nausea but taking some PO  No chest pain  --------------- Background on consult:  LUNETTA MARINA is a 78 y.o. female with a history of ESRD on HD MWF via tunneled catheter RIJ, chronic heart failure with preserved EF, severe pulmonary HTN, afib/flutter who presented to [facility] from [facility] for dialysis.  She has been diagnosed with covid .  Troponin was elevated at the OSH and cardiology was consulted for for same and a flutter.  Cath is under consideration though per their note is not currently planned.  She has been on heparin and metoprolol.  She states last HD was on Monday this week and was stopped due to hypotension.  Nursing attempted to run HD today and catheter not working well - BF of 200 - catheter was ordered for TPA and treatment post-poned.  She is a patient at [facility].  She has had some shortness of breath and some nausea - no dizziness or cramping.  Hasn't had a fistula placed yet.  Intake/Output Summary (Last 24 hours) at 09/13/2019 0706 Last data filed at 09/13/2019 0600 Gross per 24 hour  Intake 450.25 ml  Output 1650 ml  Net -1199.75 ml    Vitals:  Vitals:   09/13/19 0300 09/13/19 0400 09/13/19 0500 09/13/19 0600  BP: 115/61 118/60 (!) 118/55 (!) 106/33  Pulse: (!) 124 (!) 122 (!) 123 (!) 123  Resp: (!) 21 (!) 21 20 (!) 23  Temp:  98.5 F (36.9 C)    TempSrc:  Oral    SpO2: 99% 98% 98% 99%  Weight:      Height:         Physical Exam:  Last examined in person on 11/25 and discussed with nursing and viewed through window on 11/26 Due to the nature of  this patient's suspected COVID-19 with isolation and in keeping with efforts to prevent the spread of infection and to conserve personal protective equipment, a physical exam was not personally performed.  Patient's symptoms and exam were discussed in detail with the RN.  A chart review of other providers notes and the patient's lab work as well as review of other pertinent studies was performed.  Exam details from prior documentation were reviewed specifically and confirmed with the bedside nurse.  Location of service: [facility] adult female in bed in no acute distress HEENT normocephalic atraumatic Lungs reduced breath sounds per nursing with normal work of breathing at rest Heart tachycardia and irregular  Abdomen softly dis/obese habitus per nursing  Extremities trace edema  Psych normal mood and affect Neuro - alert and oriented x 3 per nursing  Medications reviewed   Labs:  BMP Latest Ref Rng & Units 09/13/2019 09/12/2019 09/11/2019  Glucose 70 - 99 mg/dL 195(H) 172(H) 195(H)  BUN 8 - 23 mg/dL 23 41(H) 38(H)  Creatinine 0.44 - 1.00 mg/dL 5.25(H) 6.99(H) 6.90(H)  BUN/Creat Ratio 12 - 28 - - -  Sodium 135 - 145 mmol/L 137 133(L) 135  Potassium 3.5 - 5.1 mmol/L 4.1 3.8  3.9  Chloride 98 - 111 mmol/L 98 96(L) 98  CO2 22 - 32 mmol/L 23 26 26   Calcium 8.9 - 10.3 mg/dL 9.0 9.1 9.2   [facility] [place] - [phone] Outpatient prescription  - no answer at unit on 11/25 at 2:00 pm  Assessment/Plan:   # ESRD  - Normally MWF schedule and had HD on 11/25 here.   - Plan for HD on 11/27 first shift if possible to ensure catheter still working well  - Will need to contact unit for outpatient orders - no answer on initial attempt    # A flutter/A fib with RVR - Plan per cardiology   - on metoprolol   # Heart failure with preserved EF  - Volume management with HD  # Covid 19 infection  - therapies per primary team  - optimize volume status as able   # Anemia  CKD  - Will obtain outpatient orders  - no indication for PRBC's or ESA at this time - follow for need   Claudia Desanctis, MD 09/13/2019 7:06 AM

## 2019-09-13 NOTE — Progress Notes (Signed)
Dr Camillia Herter rounding note reviewed. Being managed for aflutter, she is on metoprolol 25mg  every 6 hours and hep gtt. BP's are tolerating beta blocker dosing. Remains in aflutter rates 120s, will increase lopressor to 37.5mg  qid. Trying to avoid amio if possible as unclear how long in aflutter. With COVID + would try to avoid TEE/DCCV if possible.   She has acute on chronig HFpEF and ESRD, volume management per HD per neprhology   Elevated troponin in setting of COVID+. Thought to be demand, may need to consider cath in the future when she is COVID negative, there is no urgent indication for cath. Echo yesterday LVEF 60-65%.   COVID management per primary team.    Adjust lopressor as reported above, no additional cardiology recs today.   Carlyle Dolly MD

## 2019-09-13 NOTE — Progress Notes (Signed)
ANTICOAGULATION CONSULT NOTE - Follow Up Consult  Pharmacy Consult for heparin Indication: chest pain/ACS and atrial fibrillation  Allergies  Allergen Reactions  . Sulfa Antibiotics Hives  . Biaxin [Clarithromycin] Hives  . Buspar [Buspirone]   . Influenza A (H1n1) Monoval Vac Other (See Comments)    Pt states that she was told by her MD not to get the influenza vaccine.    . Morphine Other (See Comments)    Reaction:  Dizziness and confusion   . Pyridium [Phenazopyridine Hcl] Other (See Comments)    Reaction:  Unknown   . Tuberculin Tests   . Ceftriaxone Anxiety  . Latex Rash  . Prednisone Rash  . Tape Rash    Patient Measurements: Height: 5\' 7"  (170.2 cm) Weight: 271 lb 2.7 oz (123 kg) IBW/kg (Calculated) : 61.6 Heparin Dosing Weight: 91 kg  Vital Signs: Temp: 97.9 F (36.6 C) (11/26 0800) Temp Source: Oral (11/26 0800) BP: 97/48 (11/26 0800) Pulse Rate: 123 (11/26 0800)  Labs: Recent Labs    09/10/19 1329 09/10/19 1515  09/11/19 0645  09/11/19 2340 09/12/19 0904 09/12/19 1010 09/12/19 1850 09/13/19 0104  HGB 10.5*  --   --  9.1*  --  9.6*  --   --   --  10.1*  HCT 34.2*  --   --  29.2*  --  31.4*  --   --   --  32.7*  PLT 462*  --   --  396  --  439*  --   --   --  371  APTT  --  35  --   --   --   --   --   --   --   --   LABPROT  --  13.9  --   --   --   --   --   --   --   --   INR  --  1.1  --   --   --   --   --   --   --   --   HEPARINUNFRC  --   --    < > <0.10*   < > 0.24* 0.19*  --  0.42 0.40  CREATININE 5.81*  --   --  6.71*  --  6.90*  --  6.99*  --  5.25*  TROPONINIHS 2,336* 2,021*  --   --   --   --   --   --   --   --    < > = values in this interval not displayed.    Estimated Creatinine Clearance: 12.4 mL/min (A) (by C-G formula based on SCr of 5.25 mg/dL (H)).   Assessment: 75 yof admitted with atrial fibrillation and NSTEMI from [facility]. No anticoagulation PTA. Her CHADSVASC score is 5. Positive troponins on admission with a peak of  2336. Of note, she was COVID + at her ALF 11/4. Per cardiology, a cardioversion and cardiac cath may be planned in the future.   Hep lvl within goal  Goal of Therapy:  Heparin level 0.3-0.7 units/ml Monitor platelets by anticoagulation protocol: Yes   Plan:  Continue heparin infusion at 1950 units/hr Daily hep lvl cbc F/u planned conversion to oral [facility]  Barth Kirks, PharmD, BCPS, BCCCP Clinical Pharmacist [phone]  Please check AMION for all [place] numbers  09/13/2019 8:41 AM

## 2019-09-13 NOTE — Progress Notes (Signed)
Blood sugars trending upwards. Dr. Darrick Meigs notified. Addressed diet and sliding scale. Physician states patient hasnt been getting her lantus. Orders 5 units now ONCE.

## 2019-09-14 ENCOUNTER — Other Ambulatory Visit: Payer: Self-pay

## 2019-09-14 ENCOUNTER — Encounter (HOSPITAL_COMMUNITY): Payer: Self-pay

## 2019-09-14 DIAGNOSIS — R778 Other specified abnormalities of plasma proteins: Secondary | ICD-10-CM

## 2019-09-14 LAB — GLUCOSE, CAPILLARY
Glucose-Capillary: 157 mg/dL — ABNORMAL HIGH (ref 70–99)
Glucose-Capillary: 219 mg/dL — ABNORMAL HIGH (ref 70–99)
Glucose-Capillary: 263 mg/dL — ABNORMAL HIGH (ref 70–99)
Glucose-Capillary: 291 mg/dL — ABNORMAL HIGH (ref 70–99)
Glucose-Capillary: 299 mg/dL — ABNORMAL HIGH (ref 70–99)
Glucose-Capillary: 373 mg/dL — ABNORMAL HIGH (ref 70–99)
Glucose-Capillary: 431 mg/dL — ABNORMAL HIGH (ref 70–99)
Glucose-Capillary: 458 mg/dL — ABNORMAL HIGH (ref 70–99)

## 2019-09-14 LAB — CBC WITH DIFFERENTIAL/PLATELET
Abs Immature Granulocytes: 0.2 10*3/uL — ABNORMAL HIGH (ref 0.00–0.07)
Basophils Absolute: 0 10*3/uL (ref 0.0–0.1)
Basophils Relative: 0 %
Eosinophils Absolute: 0 10*3/uL (ref 0.0–0.5)
Eosinophils Relative: 0 %
HCT: 30.4 % — ABNORMAL LOW (ref 36.0–46.0)
Hemoglobin: 9.4 g/dL — ABNORMAL LOW (ref 12.0–15.0)
Immature Granulocytes: 2 %
Lymphocytes Relative: 14 %
Lymphs Abs: 1.2 10*3/uL (ref 0.7–4.0)
MCH: 28.9 pg (ref 26.0–34.0)
MCHC: 30.9 g/dL (ref 30.0–36.0)
MCV: 93.5 fL (ref 80.0–100.0)
Monocytes Absolute: 0.7 10*3/uL (ref 0.1–1.0)
Monocytes Relative: 8 %
Neutro Abs: 6.5 10*3/uL (ref 1.7–7.7)
Neutrophils Relative %: 76 %
Platelets: 414 10*3/uL — ABNORMAL HIGH (ref 150–400)
RBC: 3.25 MIL/uL — ABNORMAL LOW (ref 3.87–5.11)
RDW: 14.9 % (ref 11.5–15.5)
WBC: 8.6 10*3/uL (ref 4.0–10.5)
nRBC: 0 % (ref 0.0–0.2)

## 2019-09-14 LAB — HEPARIN LEVEL (UNFRACTIONATED): Heparin Unfractionated: 0.46 IU/mL (ref 0.30–0.70)

## 2019-09-14 LAB — MAGNESIUM: Magnesium: 2.2 mg/dL (ref 1.7–2.4)

## 2019-09-14 LAB — RENAL FUNCTION PANEL
Albumin: 2.7 g/dL — ABNORMAL LOW (ref 3.5–5.0)
Anion gap: 11 (ref 5–15)
BUN: 34 mg/dL — ABNORMAL HIGH (ref 8–23)
CO2: 26 mmol/L (ref 22–32)
Calcium: 9.7 mg/dL (ref 8.9–10.3)
Chloride: 95 mmol/L — ABNORMAL LOW (ref 98–111)
Creatinine, Ser: 6.53 mg/dL — ABNORMAL HIGH (ref 0.44–1.00)
GFR calc Af Amer: 7 mL/min — ABNORMAL LOW (ref >60)
GFR calc non Af Amer: 6 mL/min — ABNORMAL LOW (ref >60)
Glucose, Bld: 282 mg/dL — ABNORMAL HIGH (ref 70–99)
Phosphorus: 3.2 mg/dL (ref 2.5–4.6)
Potassium: 4.6 mmol/L (ref 3.5–5.1)
Sodium: 132 mmol/L — ABNORMAL LOW (ref 135–145)

## 2019-09-14 LAB — D-DIMER, QUANTITATIVE: D-Dimer, Quant: 1.55 ug/mL-FEU — ABNORMAL HIGH (ref 0.00–0.50)

## 2019-09-14 LAB — COMPREHENSIVE METABOLIC PANEL
ALT: 11 U/L (ref 0–44)
AST: 12 U/L — ABNORMAL LOW (ref 15–41)
Albumin: 2.7 g/dL — ABNORMAL LOW (ref 3.5–5.0)
Alkaline Phosphatase: 67 U/L (ref 38–126)
Anion gap: 12 (ref 5–15)
BUN: 34 mg/dL — ABNORMAL HIGH (ref 8–23)
CO2: 26 mmol/L (ref 22–32)
Calcium: 9.8 mg/dL (ref 8.9–10.3)
Chloride: 96 mmol/L — ABNORMAL LOW (ref 98–111)
Creatinine, Ser: 6.55 mg/dL — ABNORMAL HIGH (ref 0.44–1.00)
GFR calc Af Amer: 7 mL/min — ABNORMAL LOW (ref >60)
GFR calc non Af Amer: 6 mL/min — ABNORMAL LOW (ref >60)
Glucose, Bld: 285 mg/dL — ABNORMAL HIGH (ref 70–99)
Potassium: 4.7 mmol/L (ref 3.5–5.1)
Sodium: 134 mmol/L — ABNORMAL LOW (ref 135–145)
Total Bilirubin: 0.5 mg/dL (ref 0.3–1.2)
Total Protein: 6.4 g/dL — ABNORMAL LOW (ref 6.5–8.1)

## 2019-09-14 LAB — PHOSPHORUS: Phosphorus: 3.3 mg/dL (ref 2.5–4.6)

## 2019-09-14 LAB — FERRITIN: Ferritin: 69 ng/mL (ref 11–307)

## 2019-09-14 LAB — C-REACTIVE PROTEIN: CRP: 6 mg/dL — ABNORMAL HIGH (ref <1.0)

## 2019-09-14 MED ORDER — INSULIN ASPART 100 UNIT/ML ~~LOC~~ SOLN
17.0000 [IU] | Freq: Once | SUBCUTANEOUS | Status: AC
  Administered 2019-09-14: 17 [IU] via SUBCUTANEOUS

## 2019-09-14 MED ORDER — INSULIN GLARGINE 100 UNIT/ML ~~LOC~~ SOLN
35.0000 [IU] | Freq: Every day | SUBCUTANEOUS | Status: DC
  Administered 2019-09-14 – 2019-09-16 (×3): 35 [IU] via SUBCUTANEOUS
  Filled 2019-09-14 (×4): qty 0.35

## 2019-09-14 MED ORDER — BISACODYL 10 MG RE SUPP
10.0000 mg | Freq: Once | RECTAL | Status: AC
  Administered 2019-09-14: 10 mg via RECTAL
  Filled 2019-09-14: qty 1

## 2019-09-14 NOTE — Progress Notes (Signed)
ANTICOAGULATION CONSULT NOTE - Follow Up Consult  Pharmacy Consult for heparin Indication: chest pain/ACS and atrial fibrillation  Allergies  Allergen Reactions  . Sulfa Antibiotics Hives  . Biaxin [Clarithromycin] Hives  . Buspar [Buspirone]   . Influenza A (H1n1) Monoval Vac Other (See Comments)    Pt states that she was told by her MD not to get the influenza vaccine.    . Morphine Other (See Comments)    Reaction:  Dizziness and confusion   . Pyridium [Phenazopyridine Hcl] Other (See Comments)    Reaction:  Unknown   . Tuberculin Tests   . Ceftriaxone Anxiety  . Latex Rash  . Prednisone Rash  . Tape Rash    Patient Measurements: Height: 5\' 7"  (170.2 cm) Weight: 271 lb 2.7 oz (123 kg) IBW/kg (Calculated) : 61.6 Heparin Dosing Weight: 91 kg  Vital Signs: Temp: 97.7 F (36.5 C) (11/27 0655) Temp Source: Oral (11/27 0655) BP: 116/74 (11/27 0945) Pulse Rate: 122 (11/27 0945)  Labs: Recent Labs    09/11/19 2340  09/12/19 1850 09/13/19 0104 09/14/19 0619 09/14/19 0823 09/14/19 0824  HGB 9.6*  --   --  10.1*  --  9.4*  --   HCT 31.4*  --   --  32.7*  --  30.4*  --   PLT 439*  --   --  371  --  414*  --   HEPARINUNFRC 0.24*   < > 0.42 0.40 0.46  --   --   CREATININE 6.90*   < >  --  5.25*  --  6.55* 6.53*   < > = values in this interval not displayed.    Estimated Creatinine Clearance: 10 mL/min (A) (by C-G formula based on SCr of 6.53 mg/dL (H)).   Assessment: 82 yof admitted with atrial fibrillation and NSTEMI from [facility]. No anticoagulation PTA. Her CHADSVASC score is 5. Positive troponins on admission with a peak of 2336. Of note, she was COVID + at her ALF 11/4. Per cardiology, a cardioversion and cardiac cath may be planned in the future.   Hep lvl within goal  Goal of Therapy:  Heparin level 0.3-0.7 units/ml Monitor platelets by anticoagulation protocol: Yes   Plan:  Continue heparin infusion at 1950 units/hr Daily hep lvl cbc F/u planned conversion  to oral [facility]  Barth Kirks, PharmD, BCPS, BCCCP Clinical Pharmacist [phone]  Please check AMION for all [place] numbers  09/14/2019 9:53 AM

## 2019-09-14 NOTE — Plan of Care (Signed)
Dialysis Orders:   [place] - MWF  4 hours EDW 124.5 kg 2 K / 2.5 calcium  BF 400  DF 800  Last tx and post weight: 11/23 with post weight of 123.6 kg (stopped early after 40 minutes due to hypotension).  Previous tx was 11/20 - 126 kg departure weight  Heparin 1700 unit load and 1500 units/hr for clotting  Anemia: epo 1200 units each tx; no iron currently   No hectorol   Claudia Desanctis

## 2019-09-14 NOTE — Progress Notes (Addendum)
Inpatient Diabetes Program Recommendations  AACE/ADA: New Consensus Statement on Inpatient Glycemic Control (2015)  Target Ranges:  Prepandial:   less than 140 mg/dL      Peak postprandial:   less than 180 mg/dL (1-2 hours)      Critically ill patients:  140 - 180 mg/dL   Results for Carolyn Valdez, Carolyn Valdez (MRN [id number]) as of 09/14/2019 10:07  Ref. Range 09/13/2019 00:42 09/13/2019 04:48 09/13/2019 08:09 09/13/2019 12:43 09/13/2019 15:56 09/13/2019 20:10  Glucose-Capillary Latest Ref Range: 70 - 99 mg/dL 190 (H)  2 units NOVOLOG  190 (H)  2 units NOVOLOG  157 (H)  2 units NOVOLOG  251 (H)  5 units NOVOLOG  311 (H)  7 units NOVOLOG +  5 units LANTUS given at 5:39pm  375 (H)  9 units NOVOLOG +  25 units LANTUS given at 11:27pm    Results for Carolyn Valdez, Carolyn Valdez (MRN [id number]) as of 09/14/2019 10:07  Ref. Range 09/13/2019 23:31 09/14/2019 05:01 09/14/2019 07:21  Glucose-Capillary Latest Ref Range: 70 - 99 mg/dL 299 (H)  8 units NOVOLOG  291 (H)  8 units NOVOLOG  263 (H)  8 units NOVOLOG     Admit with: Non-STEMI/HFpEF/ Atrial flutter with RVR/ COVID+  History: DM, ESRD, CHF  Home DM Meds: Glipizide 5 mg Daily       Novolog 0-5 units TID per SSI       Lantus 45 units QHS  Current Orders: Novolog Moderate Correction Scale/ SSI (0-15 units) Q4 hours      Lantus 25 units QHS      Complaining of nausea per MD notes--Not eating much.  CBGs >200 mg/dl since Midnight.  Getting Decadron 6 mg Daily.    MD- Patient received total of 30 units Lantus yesterday 11/26 (5 units at 5pm and another 25 units at 11:30pm last night).  CBG 291 at 5am today.  Please consider the following while patient getting Decadron:  1. Increase Lantus to 35 units QHS  2. Increase Novolog SSI to Resistant scale (0-20 units) Q4 hours      --Will follow patient during hospitalization--  Wyn Quaker RN, MSN, CDE Diabetes Coordinator Inpatient Glycemic Control Team Team  Pager: [phone] (8a-5p)

## 2019-09-14 NOTE — Progress Notes (Signed)
Telemetry reviewed. She remains in atrial flutter. Rates were in the 60s this am. When here rate was in the 60s, the rhythm appeared to be atrial flutter. She is currently in atrial flutter with heart rate of 125 bpm.   Continue metoprolol 37.5 mg po Q6 hours for now. Heart rates have been controlled. No plans for TEE guided DCCV.  Her elevated troponin is likely due to demand ischemia. She has been on IV heparin. It is unlikely that we will proceed with a cardiac cath during this hospitalization due to her Covid 19 infection. She will need long term anti-coagulation for stroke reduction with new finding of atrial flutter. I would continue IV heparin until she is started on oral anti-coagulation. Continue ASA.  - Ischemic workup can be discussed as an outpatient (stress test vs cath).   We will follow with you.   Lauree Chandler 09/14/2019 9:48 AM

## 2019-09-14 NOTE — Progress Notes (Signed)
[facility] Progress Note  Name: Carolyn Valdez MRN: [id number] DOB: 1940/12/01  Chief Complaint:  Hypotension at HD earlier this week  Subjective:  Had HD on 11/25 with 1.5 kg UF.  No uop charted. She has been weaned to 1 liter oxygen.  Metoprolol increased and HR 80's on exam.  Review of systems:  Denies shortness of breath Nausea is a little better Some Po  No cp   --------------- Background on consult:  Carolyn Valdez is a 78 y.o. female with a history of ESRD on HD MWF via tunneled catheter RIJ, chronic heart failure with preserved EF, severe pulmonary HTN, afib/flutter who presented to [facility] from [facility] for dialysis.  She has been diagnosed with covid .  Troponin was elevated at the OSH and cardiology was consulted for for same and a flutter.  Cath is under consideration though per their note is not currently planned.  She has been on heparin and metoprolol.  She states last HD was on Monday this week and was stopped due to hypotension.  Nursing attempted to run HD today and catheter not working well - BF of 200 - catheter was ordered for TPA and treatment post-poned.  She is a patient at [facility].  She has had some shortness of breath and some nausea - no dizziness or cramping.  Hasn't had a fistula placed yet.  Intake/Output Summary (Last 24 hours) at 09/14/2019 0650 Last data filed at 09/14/2019 0600 Gross per 24 hour  Intake 839.98 ml  Output -  Net 839.98 ml    Vitals:  Vitals:   09/13/19 2000 09/13/19 2200 09/14/19 0000 09/14/19 0400  BP: 100/62 120/79 127/79 116/63  Pulse: (!) 116 (!) 115 (!) 118 69  Resp: 20 (!) 22 12 16   Temp: 98.4 F (36.9 C)  (!) 97.5 F (36.4 C) 98.7 F (37.1 C)  TempSrc: Oral  Oral Oral  SpO2: 93% 94% 94% 95%  Weight:      Height:         Physical Exam:  examined in person on 11/27 with appropriate PPE 5  General adult female in bed in no acute distress  HEENT normocephalic atraumatic Lungs reduced breath sounds  with normal work of breathing at rest Heart S1S2; hr 80's on exam Abdomen softly dis/obese habitus Extremities trace edema lower extremities Psych normal mood and affect Neuro - alert and oriented x 3 Access: RIJ tunneled catheter   Medications reviewed   Labs:  BMP Latest Ref Rng & Units 09/13/2019 09/12/2019 09/11/2019  Glucose 70 - 99 mg/dL 195(H) 172(H) 195(H)  BUN 8 - 23 mg/dL 23 41(H) 38(H)  Creatinine 0.44 - 1.00 mg/dL 5.25(H) 6.99(H) 6.90(H)  BUN/Creat Ratio 12 - 28 - - -  Sodium 135 - 145 mmol/L 137 133(L) 135  Potassium 3.5 - 5.1 mmol/L 4.1 3.8 3.9  Chloride 98 - 111 mmol/L 98 96(L) 98  CO2 22 - 32 mmol/L 23 26 26   Calcium 8.9 - 10.3 mg/dL 9.0 9.1 9.2   [facility] - [phone] Outpatient prescription  - no answer at unit on 11/25 at 2:00 pm  Assessment/Plan:   # ESRD   - Normally MWF schedule and had HD on 11/25 here.   - HD today 11/27 first shift to ensure catheter still working well  - Will need to contact unit for outpatient orders - no answer on initial attempt   - changed to renal diabetic diet  # A flutter/A fib with RVR   -  Plan per cardiology   - on metoprolol which was adjusted per cardiology  # Heart failure with preserved EF  - Volume management with HD - torsemide non HD days per outpt  # Covid 19 infection - therapies per primary team  - optimize volume status as able   # Anemia CKD   - Will obtain outpatient orders  - no indication for PRBC's or ESA at this time - follow for need   Claudia Desanctis, MD 09/14/2019 6:50 AM

## 2019-09-14 NOTE — Progress Notes (Signed)
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  She initially presented to ED from dialysis where she was hypotensive and tachycardic.  Dialysis session had to be stopped early.  She was diagnosed with Covid 19 on 11 4 at ALF.  She did not have similar symptoms in the been out of hospital.  She denied chest pain.  At the time of presentation to [facility], she had troponin elevation of 2336, global ST depression, Tuesday 1 atrial flutter on EKG.  She was also in atrial flutter with RVR, cardiology was consulted for non-STEMI and atrial flutter.  She was started on heparin infusion with beta-blockers.  Plan was to pursue cardiac catheterization however she became hypotensive in [place] ED and was subsequently stabilized, blood pressure was stabilized and patient transferred to [facility].    Subjective   Patient seen and examined, complains of nausea and abdominal bloating.  Complains of constipation.   Assessment/Plan:     1. Non-STEMI/HFpEF-patient had troponin elevation to 2336 with EKG changes.  Cardiology at [facility] started patient on heparin.  Continue aspirin, statin, beta-blockers.  There was a plan for cardiac catheterization.  Cardiology following, no plans for cardiac cath at this time due to Covid infection.  Continue medical management.  2. Atrial flutter with RVR-patient going in and out of flutter, currently in normal sinus rhythm.  Continue heparin protocol, metoprolol 37.5 mg p.o. every 6 hours.  3. ESRD on hemodialysis MWF-nephrology has been consulted  and patient undergoing hemodialysis in the hospital.   [place]    09/11/19 1627 09/11/19 2340 09/12/19 1850 09/13/19 0104 09/14/19 0619 09/14/19 0823  DDIMER  --  1.03*  --  1.43* 1.55*  --   FERRITIN 392*  --  465*  --   --  69  CRP 11.7*  --  14.3*  --   --  6.0*   COVID-19 infection-patient has been tested positive for COVID-19, she is currently asymptomatic.  Chest x-ray does not show infectious process.  Patient did have elevated CRP, 11.7, ferritin 392 at the time of admission.  Repeat Covid labs show CRP 14.3, ferritin 465.  Started on remdesivir per pharmacy, Decadron 6 mg p.o. daily.CRP is down to 6.0  4. Hypertension-blood pressure stable, continue metoprolol.  5. Diabetes mellitus-glucose is elevated after patient started on Decadron.  Will change Lantus to 35 units subcu daily.    6. History of seizure disorder-continue Keppra.  7. Constipation-we will give Dulcolax suppository 10 mg PR x1     CBG: Recent Labs  Lab 09/13/19 2010 09/13/19 2331 09/14/19 0501 09/14/19 0721 09/14/19 1200  GLUCAP 375* 299* 291* 263* 219*    CBC: Recent Labs  Lab 09/10/19 1329 09/11/19 0645 09/11/19 2340 09/13/19 0104 09/14/19 0823  WBC 12.6* 13.3* 10.3 8.1 8.6  NEUTROABS  --   --  6.8 4.8 6.5  HGB 10.5* 9.1* 9.6* 10.1* 9.4*  HCT 34.2* 29.2* 31.4* 32.7* 30.4*  MCV 90.2 89.0 92.4 93.2 93.5  PLT 462* 396 439* 371 414*    Basic Metabolic Panel: Recent Labs  Lab 09/11/19 2340 09/12/19 1010 09/13/19 0104 09/14/19 2637  09/14/19 0824  NA 135 133* 137 134* 132*  K 3.9 3.8 4.1 4.7 4.6  CL 98 96* 98 96* 95*  CO2 26 26 23 26 26   GLUCOSE 195* 172* 195* 285* 282*  BUN 38* 41* 23 34* 34*  CREATININE 6.90* 6.99* 5.25* 6.55* 6.53*  CALCIUM 9.2 9.1 9.0 9.8 9.7  MG 2.0  --  2.0 2.2  --   PHOS 3.5 4.0 3.1 3.3 3.2    COVID-19 Labs  Recent Labs    09/11/19 1627 09/11/19 2340 09/12/19 1850 09/13/19 0104 09/14/19 0619 09/14/19 0823  DDIMER  --   1.03*  --  1.43* 1.55*  --   FERRITIN 392*  --  465*  --   --  69  CRP 11.7*  --  14.3*  --   --  6.0*    Lab Results  Component Value Date   SARSCOV2NAA POSITIVE (A) 09/10/2019   [place] NEGATIVE 07/04/2019   [place] NEGATIVE 07/02/2019   [place] NEGATIVE 05/29/2019    DVT prophylaxis: Heparin  Code Status: Full code  Family Communication: No family at bedside  Disposition Plan: likely home when medically ready for discharge  Pressure Injury 09/12/19 Sacrum Medial Stage I -  Intact skin with non-blanchable redness of a localized area usually over a bony prominence. (Active)  09/12/19 0010  Location: Sacrum  Location Orientation: Medial  Staging: Stage I -  Intact skin with non-blanchable redness of a localized area usually over a bony prominence.  Wound Description (Comments):   Present on Admission: Yes     BMI  Estimated body mass index is 42.47 kg/m as calculated from the following:   Height as of this encounter: 5\' 7"  (1.702 m).   Weight as of this encounter: 123 kg.  Scheduled medications:  . aspirin  81 mg Oral Daily  . atorvastatin  40 mg Oral q1800  . bisacodyl  10 mg Rectal Once  . calcium acetate  1,334 mg Oral TID AC  . Chlorhexidine Gluconate Cloth  6 each Topical Q0600  . Chlorhexidine Gluconate Cloth  6 each Topical Q0600  . citalopram  10 mg Oral Daily  . dexamethasone  6 mg Oral Daily  . docusate sodium  100 mg Oral BID  . insulin aspart  0-15 Units Subcutaneous Q4H  . insulin glargine  25 Units Subcutaneous QHS  . lamoTRIgine  25 mg Oral Daily  . levETIRAcetam  250 mg Oral BID  . mouth rinse  15 mL Mouth Rinse BID  . metoprolol tartrate  37.5 mg Oral Q6H  . mirabegron ER  25 mg Oral Daily  . OLANZapine  5 mg Oral QHS  . pantoprazole  40 mg Oral Daily  . senna  1 tablet Oral BID  . sodium chloride flush  3 mL Intravenous Q12H  . torsemide  40 mg Oral Once per day on Sun Tue Thu Sat  . vitamin C  500 mg Oral Daily  . zinc  sulfate  220 mg Oral Daily    Consultants:  Cardiology  Nephrology  Procedures:     Antibiotics:   Anti-infectives (From admission, onward)   Start     Dose/Rate Route Frequency Ordered Stop   09/14/19 1000  remdesivir 100 mg in sodium chloride 0.9 % 250 mL IVPB     100 mg 500 mL/hr over 30 Minutes Intravenous Every 24 hours 09/13/19 0858 09/18/19 0959   09/13/19 1000  remdesivir 200 mg in sodium chloride 0.9 % 250 mL IVPB  200 mg 500 mL/hr over 30 Minutes Intravenous Once 09/13/19 0858 09/13/19 1126   09/12/19 2217  remdesivir 100 mg in sodium chloride 0.9 % 250 mL IVPB  Status:  Discontinued     100 mg 500 mL/hr over 30 Minutes Intravenous Every 24 hours 09/11/19 2224 09/11/19 2252   09/11/19 2230  remdesivir 200 mg in sodium chloride 0.9 % 250 mL IVPB  Status:  Discontinued     200 mg 500 mL/hr over 30 Minutes Intravenous Once 09/11/19 2224 09/11/19 2252       Objective   Vitals:   09/14/19 1100 09/14/19 1200 09/14/19 1300 09/14/19 1400  BP: (!) 105/53 (!) 99/56 116/67 117/69  Pulse: 92 (!) 106 (!) 120 89  Resp: 16 16 (!) 21 15  Temp:      TempSrc:      SpO2: 90% 97% 95% 95%  Weight:      Height:        Intake/Output Summary (Last 24 hours) at 09/14/2019 1416 Last data filed at 09/14/2019 1400 Gross per 24 hour  Intake 467.26 ml  Output 2000 ml  Net -1532.74 ml   Filed Weights   09/11/19 2048 09/12/19 1256 09/12/19 1644  Weight: 124.4 kg 125.5 kg 123 kg     Physical Examination:   General-appears in no acute distress Heart-S1-S2, regular, no murmur auscultated Lungs-clear to auscultation bilaterally, no wheezing or crackles auscultated Abdomen-soft, nontender, no organomegaly Extremities-no edema in the lower extremities Neuro-alert, oriented x3, no focal deficit noted    Data Reviewed: I have personally reviewed following labs and imaging studies   Recent Results (from the past 240 hour(s))  SARS CORONAVIRUS 2 (TAT 6-24 HRS)  Nasopharyngeal Nasopharyngeal Swab     Status: Abnormal   Collection Time: 09/10/19  1:24 PM   Specimen: Nasopharyngeal Swab  Result Value Ref Range Status   SARS Coronavirus 2 POSITIVE (A) NEGATIVE Final    Comment: RESULT CALLED TO, READ BACK BY AND VERIFIED WITH: M.JOHNSON RN 2022 09/10/2019 MCCORMICK K (NOTE) SARS-CoV-2 target nucleic acids are DETECTED. The SARS-CoV-2 RNA is generally detectable in upper and lower respiratory specimens during the acute phase of infection. Positive results are indicative of active infection with SARS-CoV-2. Clinical  correlation with patient history and other diagnostic information is necessary to determine patient infection status. Positive results do  not rule out bacterial infection or co-infection with other viruses. The expected result is Negative. Fact Sheet for Patients: SugarRoll.be Fact Sheet for Healthcare Providers: https://www.woods-mathews.com/ This test is not yet approved or cleared by the [place] FDA and  has been authorized for detection and/or diagnosis of SARS-CoV-2 by FDA under an Emergency Use Authorization (EUA). This EUA will remain  in effect (meaning this test can be used)  for the duration of the COVID-19 declaration under Section 564(b)(1) of the Act, 21 U.S.C. section 360bbb-3(b)(1), unless the authorization is terminated or revoked sooner. Performed at [facility], [place] [address], [place], [place] [address]   MRSA PCR Screening     Status: None   Collection Time: 09/11/19  9:01 PM   Specimen: Nasopharyngeal  Result Value Ref Range Status   MRSA by PCR NEGATIVE NEGATIVE Final    Comment:        The GeneXpert MRSA Assay (FDA approved for NASAL specimens only), is one component of a comprehensive MRSA colonization surveillance program. It is not intended to diagnose MRSA infection nor to guide or monitor treatment for MRSA infections. Performed at [facility], [place] [address], [place], [place] [address]      Liver Function Tests: Recent Labs  Lab 09/11/19 2340 09/12/19 1010 09/13/19 0104 09/14/19 0823 09/14/19 0824  AST 16  --  15 12*  --   ALT 13  --  11 11  --   ALKPHOS 73  --  70 67  --   BILITOT 1.0  --  0.8 0.5  --   PROT 6.4*  --  6.4* 6.4*  --   ALBUMIN 2.8* 2.7* 2.7* 2.7* 2.7*   No results for input(s): LIPASE, AMYLASE in the last 168 hours. No results for input(s): AMMONIA in the last 168 hours.  Cardiac Enzymes: No results for input(s): CKTOTAL, CKMB, CKMBINDEX, TROPONINI in the last 168 hours. BNP (last 3 results) Recent Labs    03/02/19 0530 07/02/19 1734 09/10/19 1329  BNP 867.0* 97.0 869.0*    ProBNP (last 3 results) No results for input(s): PROBNP in the last 8760 hours.    Studies: No results found.   Admission status: Inpatient: Based on patients clinical presentation and evaluation of above clinical data, I have made determination that patient meets Inpatient criteria at this time.   [facility] Pager [phone]. If 7PM-7AM, please contact night-coverage at [email or web address], Office  [phone]  password [email or web address]  09/14/2019, 2:16 PM  LOS: 3 days

## 2019-09-15 LAB — CBC WITH DIFFERENTIAL/PLATELET
Abs Immature Granulocytes: 0.2 10*3/uL — ABNORMAL HIGH (ref 0.00–0.07)
Basophils Absolute: 0 10*3/uL (ref 0.0–0.1)
Basophils Relative: 0 %
Eosinophils Absolute: 0 10*3/uL (ref 0.0–0.5)
Eosinophils Relative: 0 %
HCT: 30 % — ABNORMAL LOW (ref 36.0–46.0)
Hemoglobin: 9.3 g/dL — ABNORMAL LOW (ref 12.0–15.0)
Immature Granulocytes: 2 %
Lymphocytes Relative: 12 %
Lymphs Abs: 1.3 10*3/uL (ref 0.7–4.0)
MCH: 28.5 pg (ref 26.0–34.0)
MCHC: 31 g/dL (ref 30.0–36.0)
MCV: 92 fL (ref 80.0–100.0)
Monocytes Absolute: 1 10*3/uL (ref 0.1–1.0)
Monocytes Relative: 8 %
Neutro Abs: 8.9 10*3/uL — ABNORMAL HIGH (ref 1.7–7.7)
Neutrophils Relative %: 78 %
Platelets: 397 10*3/uL (ref 150–400)
RBC: 3.26 MIL/uL — ABNORMAL LOW (ref 3.87–5.11)
RDW: 15.2 % (ref 11.5–15.5)
WBC: 11.5 10*3/uL — ABNORMAL HIGH (ref 4.0–10.5)
nRBC: 0 % (ref 0.0–0.2)

## 2019-09-15 LAB — GLUCOSE, CAPILLARY
Glucose-Capillary: 329 mg/dL — ABNORMAL HIGH (ref 70–99)
Glucose-Capillary: 285 mg/dL — ABNORMAL HIGH (ref 70–99)
Glucose-Capillary: 307 mg/dL — ABNORMAL HIGH (ref 70–99)
Glucose-Capillary: 385 mg/dL — ABNORMAL HIGH (ref 70–99)

## 2019-09-15 LAB — COMPREHENSIVE METABOLIC PANEL
ALT: 12 U/L (ref 0–44)
AST: 17 U/L (ref 15–41)
Albumin: 2.9 g/dL — ABNORMAL LOW (ref 3.5–5.0)
Alkaline Phosphatase: 73 U/L (ref 38–126)
Anion gap: 13 (ref 5–15)
BUN: 27 mg/dL — ABNORMAL HIGH (ref 8–23)
CO2: 24 mmol/L (ref 22–32)
Calcium: 8.8 mg/dL — ABNORMAL LOW (ref 8.9–10.3)
Chloride: 93 mmol/L — ABNORMAL LOW (ref 98–111)
Creatinine, Ser: 5.08 mg/dL — ABNORMAL HIGH (ref 0.44–1.00)
GFR calc Af Amer: 9 mL/min — ABNORMAL LOW (ref >60)
GFR calc non Af Amer: 8 mL/min — ABNORMAL LOW (ref >60)
Glucose, Bld: 353 mg/dL — ABNORMAL HIGH (ref 70–99)
Potassium: 4.2 mmol/L (ref 3.5–5.1)
Sodium: 130 mmol/L — ABNORMAL LOW (ref 135–145)
Total Bilirubin: 0.8 mg/dL (ref 0.3–1.2)
Total Protein: 6.3 g/dL — ABNORMAL LOW (ref 6.5–8.1)

## 2019-09-15 LAB — MAGNESIUM: Magnesium: 1.9 mg/dL (ref 1.7–2.4)

## 2019-09-15 LAB — HEPARIN LEVEL (UNFRACTIONATED): Heparin Unfractionated: 0.51 IU/mL (ref 0.30–0.70)

## 2019-09-15 LAB — C-REACTIVE PROTEIN: CRP: 3.4 mg/dL — ABNORMAL HIGH (ref <1.0)

## 2019-09-15 LAB — FERRITIN: Ferritin: 390 ng/mL — ABNORMAL HIGH (ref 11–307)

## 2019-09-15 LAB — PHOSPHORUS: Phosphorus: 3 mg/dL (ref 2.5–4.6)

## 2019-09-15 LAB — D-DIMER, QUANTITATIVE: D-Dimer, Quant: 1.33 ug/mL-FEU — ABNORMAL HIGH (ref 0.00–0.50)

## 2019-09-15 MED ORDER — INSULIN ASPART 100 UNIT/ML ~~LOC~~ SOLN
0.0000 [IU] | Freq: Three times a day (TID) | SUBCUTANEOUS | Status: DC
  Administered 2019-09-15 – 2019-09-16 (×2): 11 [IU] via SUBCUTANEOUS

## 2019-09-15 MED ORDER — METOPROLOL TARTRATE 50 MG PO TABS
75.0000 mg | ORAL_TABLET | Freq: Two times a day (BID) | ORAL | Status: DC
  Administered 2019-09-15 – 2019-10-01 (×21): 75 mg via ORAL
  Filled 2019-09-15: qty 6
  Filled 2019-09-15: qty 1.5
  Filled 2019-09-15 (×5): qty 2
  Filled 2019-09-15 (×3): qty 1.5
  Filled 2019-09-15: qty 2
  Filled 2019-09-15: qty 1.5
  Filled 2019-09-15: qty 2
  Filled 2019-09-15: qty 1.5
  Filled 2019-09-15: qty 2
  Filled 2019-09-15 (×2): qty 1.5
  Filled 2019-09-15: qty 2
  Filled 2019-09-15: qty 1.5
  Filled 2019-09-15: qty 6
  Filled 2019-09-15 (×2): qty 2
  Filled 2019-09-15: qty 6
  Filled 2019-09-15 (×2): qty 2
  Filled 2019-09-15: qty 1.5
  Filled 2019-09-15: qty 2
  Filled 2019-09-15: qty 1.5
  Filled 2019-09-15: qty 2
  Filled 2019-09-15: qty 6
  Filled 2019-09-15: qty 2
  Filled 2019-09-15: qty 1.5
  Filled 2019-09-15: qty 2
  Filled 2019-09-15: qty 6
  Filled 2019-09-15 (×3): qty 2

## 2019-09-15 MED ORDER — DARBEPOETIN ALFA 40 MCG/0.4ML IJ SOSY
40.0000 ug | PREFILLED_SYRINGE | Freq: Once | INTRAMUSCULAR | Status: AC
  Administered 2019-09-15: 40 ug via SUBCUTANEOUS
  Filled 2019-09-15: qty 0.4

## 2019-09-15 MED ORDER — DARBEPOETIN ALFA 40 MCG/0.4ML IJ SOSY
40.0000 ug | PREFILLED_SYRINGE | Freq: Once | INTRAMUSCULAR | Status: DC
  Filled 2019-09-15: qty 0.4

## 2019-09-15 MED ORDER — INSULIN ASPART 100 UNIT/ML ~~LOC~~ SOLN
4.0000 [IU] | Freq: Three times a day (TID) | SUBCUTANEOUS | Status: DC
  Administered 2019-09-15 – 2019-09-16 (×2): 4 [IU] via SUBCUTANEOUS

## 2019-09-15 MED ORDER — DARBEPOETIN ALFA 40 MCG/0.4ML IJ SOSY: 40.0000 ug | PREFILLED_SYRINGE | INTRAMUSCULAR | Status: DC

## 2019-09-15 NOTE — NC FL2 (Addendum)
[place] MEDICAID FL2 LEVEL OF CARE SCREENING TOOL     IDENTIFICATION  Patient Name: Carolyn Valdez: 07-19-1941 Sex: female Admission Date (Current Location): 09/11/2019  [employer] and [place] Number:  Herbalist and Address:  [facility], [place] [address] [place], [place] [address]      Provider Number: [id number]  Attending Physician Name and Address:  Oswald Hillock, MD  Relative Name and Phone Number:  Rolla Plate, [phone]    Current Level of Care: Hospital Recommended Level of Care: [place] Prior Approval Number:    Date Approved/Denied:   PASRR Number: [id number]  Discharge Plan: Domiciliary (Rest home)    Current Diagnoses: Patient Active Problem List   Diagnosis Date Noted  . Atrial flutter ([place]) 09/11/2019  . Respiratory tract infection due to COVID-19 virus 09/11/2019  . Atrial flutter, paroxysmal ([place]) 09/10/2019  . Elevated troponin 09/10/2019  . Dyspnea 09/10/2019  . GERD (gastroesophageal reflux disease) 09/10/2019  . Degenerative joint disease 09/10/2019  . Pulmonary hypertension ([place]) 09/10/2019  . Hypotension 09/10/2019  . Lab test positive for detection of COVID-19 virus 09/10/2019  . NSTEMI (non-ST elevated myocardial infarction) ([place])   . ESRD on dialysis ([place]) 07/24/2019  . Dizziness 07/03/2019  . Chronic diastolic CHF (congestive heart failure) ([place]) 03/02/2019  . Anemia associated with chronic renal failure 10/25/2018  . Acute hemorrhagic gastritis   . Angiodysplasia of stomach and duodenum   . CKD (chronic kidney disease), stage III 08/04/2018  . Atrial fibrillation with RVR ([place]) 04/23/2018  . Syncope 04/22/2018  . Acute respiratory failure with hypoxia and hypercapnia ([place]) 02/21/2017  . Adjustment disorder with anxiety 01/28/2017  . Acute on chronic respiratory failure with hypoxia ([place]) 01/23/2017  . Palliative care encounter   . Goals of care,  counseling/discussion   . DNR (do not resuscitate) 10/05/2016  . Palliative care by specialist 10/05/2016  . Depression, major, recurrent, severe with psychosis ([place]) 10/05/2016  . Severe major depression, single episode, with psychotic features ([place]) 10/04/2016  . Cellulitis of leg, right 09/29/2016  . Hypercapnia 09/29/2016  . Weakness 09/29/2016  . Gastrointestinal hemorrhage   . Altered mental status 09/24/2016  . Pressure injury of skin 09/02/2016  . Respiratory failure with hypoxia ([place]) 09/01/2016  . Lymphedema 08/16/2016  . Acute on chronic heart failure with preserved ejection fraction (HFpEF) ([place]) 08/09/2016  . Acquired lymphedema of leg 04/21/2016  . Nocturia 11/05/2015  . Urinary frequency 11/05/2015  . Acute respiratory failure with hypoxia ([place]) 09/05/2015  . 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 Incontinence 04/20/2015  . Diabetes mellitus, type 2 ([place]) 04/17/2015  . ESBL (extended spectrum beta-lactamase) producing bacteria infection 04/17/2015  . Hypertension 04/17/2015  . Frequent UTI 04/17/2015  . Absence of bladder continence 04/17/2015  . Iron deficiency anemia 03/08/2015  . Symptomatic anemia 09/25/2014  . Abdominal pain, lower 09/25/2014  . Urge incontinence 02/19/2013  . Bladder infection, chronic 08/11/2012  . Difficult or painful urination 07/26/2012  . Lower urinary tract infection 12/31/2011  . Diabetes mellitus ([place]) 12/31/2011    Orientation RESPIRATION BLADDER Height & Weight     Self, Time, Situation, Place  Normal Continent, External catheter Weight: 271 lb 2.7 oz (123 kg) Height:  5\' 7"  (170.2 cm)  BEHAVIORAL SYMPTOMS/MOOD NEUROLOGICAL BOWEL NUTRITION STATUS      Continent Diet(renal/carb modified diet, thin liquids, fluid restriction 1563ml)  AMBULATORY STATUS COMMUNICATION OF NEEDS Skin   Limited Assist Verbally Bruising, Other (Comment)(bruising on left  hand, pressure injury stage one on sacrum (dressing in place, PRN change schedule))                        Personal Care Assistance Level of Assistance  Bathing, Feeding, Dressing Bathing Assistance: Limited assistance Feeding assistance: Independent Dressing Assistance: Limited assistance     Functional Limitations Info             SPECIAL CARE FACTORS FREQUENCY                       Contractures Contractures Info: Not present    Additional Factors Info  Code Status, Allergies, Psychotropic, Insulin Sliding Scale, Isolation Precautions Code Status Info: DNR Allergies Info: Sulfa Antibiotics, Biaxin (Clarithromycin), Buspar (Buspirone), Influenza A (H1n1) Monoval Vac, Morphine, Pyridium (Phenazopyridine Hcl), Tuberculin Tests, Ceftriaxone, Latex, Prednisone, Tape Psychotropic Info: celexa 10mg  daily and zypreca 5mg  daily at bedtime Insulin Sliding Scale Info: insulin aspart novolog 0-15 units every 4 hours and insulin glargine lantus 35 units at bedtime Isolation Precautions Info: COVID +     Current Medications (09/15/2019):  This is the current hospital active medication list Current Facility-Administered Medications  Medication Dose Route Frequency Provider Last Rate Last Dose  . acetaminophen (TYLENOL) tablet 650 mg  650 mg Oral Q6H PRN Bennie Pierini, MD   650 mg at 09/15/19 0903  . albuterol (VENTOLIN HFA) 108 (90 Base) MCG/ACT inhaler 2 puff  2 puff Inhalation Q6H PRN Bennie Pierini, MD      . aspirin chewable tablet 81 mg  81 mg Oral Daily Bennie Pierini, MD   81 mg at 09/14/19 1156  . atorvastatin (LIPITOR) tablet 40 mg  40 mg Oral q1800 Bennie Pierini, MD   40 mg at 09/14/19 1732  . calcium acetate (PHOSLO) capsule 1,334 mg  1,334 mg Oral TID AC Bennie Pierini, MD   1,334 mg at 09/15/19 [id number]  . Chlorhexidine Gluconate Cloth 2 % PADS 6 each  6 each Topical Q0600 Claudia Desanctis, MD   6 each at 09/14/19 0000  . Chlorhexidine Gluconate Cloth 2 % PADS 6 each  6 each Topical Q0600 Claudia Desanctis, MD      . chlorpheniramine-HYDROcodone  (TUSSIONEX) 10-8 MG/5ML suspension 5 mL  5 mL Oral Q12H PRN Bennie Pierini, MD      . citalopram (CELEXA) tablet 10 mg  10 mg Oral Daily Bennie Pierini, MD   10 mg at 09/14/19 1150  . clonazePAM (KLONOPIN) disintegrating tablet 0.25 mg  0.25 mg Oral Q12H PRN Bennie Pierini, MD      . Darbepoetin Alfa (ARANESP) injection 40 mcg  40 mcg Subcutaneous Once Harrie Jeans C, MD      . dexamethasone (DECADRON) tablet 6 mg  6 mg Oral Daily Oswald Hillock, MD   6 mg at 09/14/19 1154  . docusate sodium (COLACE) capsule 100 mg  100 mg Oral BID Bennie Pierini, MD   100 mg at 09/14/19 1150  . guaiFENesin-dextromethorphan (ROBITUSSIN DM) 100-10 MG/5ML syrup 10 mL  10 mL Oral Q4H PRN Bennie Pierini, MD      . heparin ADULT infusion 100 units/mL (25000 units/267mL sodium chloride 0.45%)  1,950 Units/hr Intravenous Continuous Oswald Hillock, MD 19.5 mL/hr at 09/15/19 0600 1,950 Units/hr at 09/15/19 0600  . insulin aspart (novoLOG) injection 0-15 Units  0-15 Units Subcutaneous Q4H Blount, Lolita Cram, NP   8 Units at 09/15/19  0820  . insulin glargine (LANTUS) injection 35 Units  35 Units Subcutaneous QHS Oswald Hillock, MD   35 Units at 09/14/19 2348  . lamoTRIgine (LAMICTAL) tablet 25 mg  25 mg Oral Daily Bennie Pierini, MD   25 mg at 09/14/19 1155  . levETIRAcetam (KEPPRA) tablet 250 mg  250 mg Oral BID Bennie Pierini, MD   250 mg at 09/14/19 2346  . MEDLINE mouth rinse  15 mL Mouth Rinse BID Oswald Hillock, MD   15 mL at 09/14/19 1158  . metoprolol tartrate (LOPRESSOR) tablet 75 mg  75 mg Oral BID Lelon Perla, MD      . mirabegron ER [facility]) tablet 25 mg  25 mg Oral Daily Bennie Pierini, MD   25 mg at 09/14/19 1151  . OLANZapine (ZYPREXA) tablet 5 mg  5 mg Oral QHS Bennie Pierini, MD   5 mg at 09/14/19 2346  . ondansetron (ZOFRAN) tablet 4 mg  4 mg Oral Q6H PRN Bennie Pierini, MD       Or  . ondansetron [facility]) injection 4 mg  4 mg Intravenous Q6H PRN Bennie Pierini,  MD   4 mg at 09/15/19 0737  . oxyCODONE (Oxy IR/ROXICODONE) immediate release tablet 5 mg  5 mg Oral Q4H PRN Bennie Pierini, MD      . pantoprazole (PROTONIX) EC tablet 40 mg  40 mg Oral Daily Bennie Pierini, MD   40 mg at 09/14/19 1153  . polyethylene glycol (MIRALAX / GLYCOLAX) packet 17 g  17 g Oral Daily PRN Bennie Pierini, MD      . remdesivir 100 mg in sodium chloride 0.9 % 250 mL IVPB  100 mg Intravenous Q24H Oswald Hillock, MD 500 mL/hr at 09/14/19 1146 100 mg at 09/14/19 1146  . senna (SENOKOT) tablet 8.6 mg  1 tablet Oral BID Bennie Pierini, MD   8.6 mg at 09/14/19 2347  . sodium chloride flush (NS) 0.9 % injection 3 mL  3 mL Intravenous Q12H Bennie Pierini, MD   3 mL at 09/13/19 1246  . torsemide [facility]) tablet 40 mg  40 mg Oral Once per day on Sun Tue Thu Sat Bennie Pierini, MD   40 mg at 09/13/19 1059  . vitamin C (ASCORBIC ACID) tablet 500 mg  500 mg Oral Daily Bennie Pierini, MD   500 mg at 09/14/19 1155  . zinc sulfate capsule 220 mg  220 mg Oral Daily Bennie Pierini, MD   220 mg at 09/14/19 1156     Discharge Medications: Please see discharge summary for a list of discharge medications.  Relevant Imaging Results:  Relevant Lab Results:   Additional Information SSN: [id number]   [address] [place], LCSWA

## 2019-09-15 NOTE — Progress Notes (Signed)
Progress Note  Patient Name: Carolyn Valdez Date of Encounter: 09/15/2019  Primary Cardiologist: Kathlyn Sacramento, MD   Subjective   Per nursing, no CP or dyspnea  Inpatient Medications    Scheduled Meds: . aspirin  81 mg Oral Daily  . atorvastatin  40 mg Oral q1800  . calcium acetate  1,334 mg Oral TID AC  . Chlorhexidine Gluconate Cloth  6 each Topical Q0600  . Chlorhexidine Gluconate Cloth  6 each Topical Q0600  . citalopram  10 mg Oral Daily  . darbepoetin (ARANESP) injection - DIALYSIS  40 mcg Intravenous Once  . dexamethasone  6 mg Oral Daily  . docusate sodium  100 mg Oral BID  . insulin aspart  0-15 Units Subcutaneous Q4H  . insulin glargine  35 Units Subcutaneous QHS  . lamoTRIgine  25 mg Oral Daily  . levETIRAcetam  250 mg Oral BID  . mouth rinse  15 mL Mouth Rinse BID  . metoprolol tartrate  37.5 mg Oral Q6H  . mirabegron ER  25 mg Oral Daily  . OLANZapine  5 mg Oral QHS  . pantoprazole  40 mg Oral Daily  . senna  1 tablet Oral BID  . sodium chloride flush  3 mL Intravenous Q12H  . torsemide  40 mg Oral Once per day on Sun Tue Thu Sat  . vitamin C  500 mg Oral Daily  . zinc sulfate  220 mg Oral Daily   Continuous Infusions: . heparin 1,950 Units/hr (09/15/19 0600)  . remdesivir 100 mg in NS 250 mL 100 mg (09/14/19 1146)   PRN Meds: acetaminophen, albuterol, chlorpheniramine-HYDROcodone, clonazePAM, guaiFENesin-dextromethorphan, ondansetron **OR** ondansetron (ZOFRAN) IV, oxyCODONE, polyethylene glycol   Vital Signs    Vitals:   09/15/19 0400 09/15/19 0427 09/15/19 0500 09/15/19 0600  BP: (!) 112/58 (!) 139/58 (!) 120/54 (!) 126/55  Pulse: 69 72 75 89  Resp: 19  16 16   Temp: 97.6 F (36.4 C)     TempSrc: Oral     SpO2: 94%  94% 94%  Weight:      Height:        Intake/Output Summary (Last 24 hours) at 09/15/2019 0845 Last data filed at 09/15/2019 0600 Gross per 24 hour  Intake 678.79 ml  Output 2000 ml  Net -1321.21 ml   Last 3 Weights  09/12/2019 09/12/2019 09/11/2019  Weight (lbs) 271 lb 2.7 oz 276 lb 10.8 oz 274 lb 4 oz  Weight (kg) 123 kg 125.5 kg 124.4 kg      Telemetry    Atrial flutter, rate reasonably well controlled - Personally Reviewed  Physical Exam   Not examined due to covid positive  Labs    High Sensitivity Troponin:   Recent Labs  Lab 09/10/19 1329 09/10/19 1515  TROPONINIHS 2,336* 2,021*      Chemistry Recent Labs  Lab 09/13/19 0104 09/14/19 0823 09/14/19 0824 09/15/19 0615  NA 137 134* 132* 130*  K 4.1 4.7 4.6 4.2  CL 98 96* 95* 93*  CO2 23 26 26 24   GLUCOSE 195* 285* 282* 353*  BUN 23 34* 34* 27*  CREATININE 5.25* 6.55* 6.53* 5.08*  CALCIUM 9.0 9.8 9.7 8.8*  PROT 6.4* 6.4*  --  6.3*  ALBUMIN 2.7* 2.7* 2.7* 2.9*  AST 15 12*  --  17  ALT 11 11  --  12  ALKPHOS 70 67  --  73  BILITOT 0.8 0.5  --  0.8  GFRNONAA 7* 6* 6* 8*  GFRAA 9*  7* 7* 9*  ANIONGAP 16* 12 11 13      Hematology Recent Labs  Lab 09/13/19 0104 09/14/19 0823 09/15/19 0615  WBC 8.1 8.6 11.5*  RBC 3.51* 3.25* 3.26*  HGB 10.1* 9.4* 9.3*  HCT 32.7* 30.4* 30.0*  MCV 93.2 93.5 92.0  MCH 28.8 28.9 28.5  MCHC 30.9 30.9 31.0  RDW 15.4 14.9 15.2  PLT 371 414* 397    BNP Recent Labs  Lab 09/10/19 1329  BNP 869.0*     DDimer  Recent Labs  Lab 09/13/19 0104 09/14/19 0619 09/15/19 0615  DDIMER 1.43* 1.55* 1.33*     Patient Profile     78 y.o. female with history of chronic HFpEF, pulmonary hypertension, GI bleed (07/2018), hypertension, hyperlipidemia, diabetes mellitus, obesity, and ESRD on hemodialysis who was admitted to [facility] with dyspnea, fatigue and palpitations, not tolerating HD. She was found to have atrial flutter with RVR and mildly elevated troponin. She tested positive for Covid 19.  She was seen [facility] by Dr. Fletcher Anon and Dr. Saunders Revel and placed continued on IV heparin. She has been rate controlled with metoprolol.   Assessment & Plan    1 atrial flutter-heart rate appears to be reasonably  well controlled.  We will continue metoprolol but change to 75 mg twice daily.  Continue IV heparin.  Will ultimately need transition to Coumadin.  Plan will then be to proceed with cardioversion after Covid infection resolves.  This would be done 4 weeks after INR therapeutic.  If rate difficult to control could plan TEE guided cardioversion.  2 elevated troponin-this is felt to be demand ischemia.  Echocardiogram shows normal LV function.  Can consider further ischemia evaluation when she recovers from Covid.  This can be performed as an outpatient.  3 end-stage renal disease-nephrology following.  4 history of acute on chronic diastolic congestive heart failure-volume management per dialysis.  For questions or updates, please contact [place] Please consult [email or web address] for contact info under        Signed, Kirk Ruths, MD  09/15/2019, 8:45 AM

## 2019-09-15 NOTE — TOC Initial Note (Signed)
Transition of Care [facility]) - Initial/Assessment Note    Patient Details  Name: Carolyn Valdez MRN: [id number] Date of Birth: 07-Jun-1941  Transition of Care [facility]) CM/SW Contact:    Gelene Mink, [place] Phone Number: 09/15/2019, 10:31 AM  Clinical Narrative:                  Patient from [facility] in [place]. CSW called and spoke with the patient's sister, [name]. [name] is agreeable to sending her sister back to her ALF when she is medically ready. CSW asked if the patient were to need skilled rehab, would she be agreeable, she was. The patient has been to [facility] in [place] and they did not like the care the patient received.   CSW will continue to follow for disposition needs.   If the patient is going to need SNF, please consult PT and OT.   Expected Discharge Plan: Assisted Living Barriers to Discharge: Continued Medical Work up   Patient Goals and CMS Choice Patient states their goals for this hospitalization and ongoing recovery are:: Pt sister wants her to get better   Choice offered to / list presented to : NA  Expected Discharge Plan and Services Expected Discharge Plan: Assisted Living In-house Referral: Clinical Social Work   Post Acute Care Choice: Nursing Home Living arrangements for the past 2 months: [place]                                      Prior Living Arrangements/Services Living arrangements for the past 2 months: [place] Lives with:: Facility Resident Patient language and need for interpreter reviewed:: No Do you feel safe going back to the place where you live?: Yes      Need for Family Participation in Patient Care: No (Comment) Care giver support system in place?: Yes (comment)   Criminal Activity/Legal Involvement Pertinent to Current Situation/Hospitalization: No - Comment as needed  Activities of Daily Living Home Assistive Devices/Equipment: CBG Meter, Grab bars  in shower ADL Screening (condition at time of admission) Patient's cognitive ability adequate to safely complete daily activities?: Yes Is the patient deaf or have difficulty hearing?: No Does the patient have difficulty seeing, even when wearing glasses/contacts?: No Does the patient have difficulty concentrating, remembering, or making decisions?: No Patient able to express need for assistance with ADLs?: Yes Does the patient have difficulty dressing or bathing?: Yes Independently performs ADLs?: No Does the patient have difficulty walking or climbing stairs?: Yes Weakness of Legs: Both Weakness of Arms/Hands: None  Permission Sought/Granted Permission sought to share information with : Case Manager Permission granted to share information with : Yes, Verbal Permission Granted  Share Information with NAME: [name]  Permission granted to share info w AGENCY: ALF  Permission granted to share info w Relationship: Sister     Emotional Assessment Appearance:: Appears stated age Attitude/Demeanor/Rapport: Unable to Assess Affect (typically observed): Unable to Assess Orientation: : Oriented to Self, Oriented to Place, Oriented to  Time, Oriented to Situation Alcohol / Substance Use: Not Applicable Psych Involvement: No (comment)  Admission diagnosis:  covid 19 ATRIAL FLUTTER RENAL FAILURE Patient Active Problem List   Diagnosis Date Noted  . Atrial flutter ([place]) 09/11/2019  . Respiratory tract infection due to COVID-19 virus 09/11/2019  . Atrial flutter, paroxysmal ([place]) 09/10/2019  . Elevated troponin 09/10/2019  . Dyspnea 09/10/2019  .  GERD (gastroesophageal reflux disease) 09/10/2019  . Degenerative joint disease 09/10/2019  . Pulmonary hypertension ([place]) 09/10/2019  . Hypotension 09/10/2019  . Lab test positive for detection of COVID-19 virus 09/10/2019  . NSTEMI (non-ST elevated myocardial infarction) ([place])   . ESRD on dialysis ([place]) 07/24/2019  . Dizziness 07/03/2019   . Chronic diastolic CHF (congestive heart failure) ([place]) 03/02/2019  . Anemia associated with chronic renal failure 10/25/2018  . Acute hemorrhagic gastritis   . Angiodysplasia of stomach and duodenum   . CKD (chronic kidney disease), stage III 08/04/2018  . Atrial fibrillation with RVR ([place]) 04/23/2018  . Syncope 04/22/2018  . Acute respiratory failure with hypoxia and hypercapnia ([place]) 02/21/2017  . Adjustment disorder with anxiety 01/28/2017  . Acute on chronic respiratory failure with hypoxia ([place]) 01/23/2017  . Palliative care encounter   . Goals of care, counseling/discussion   . DNR (do not resuscitate) 10/05/2016  . Palliative care by specialist 10/05/2016  . Depression, major, recurrent, severe with psychosis ([place]) 10/05/2016  . Severe major depression, single episode, with psychotic features ([place]) 10/04/2016  . Cellulitis of leg, right 09/29/2016  . Hypercapnia 09/29/2016  . Weakness 09/29/2016  . Gastrointestinal hemorrhage   . Altered mental status 09/24/2016  . Pressure injury of skin 09/02/2016  . Respiratory failure with hypoxia ([place]) 09/01/2016  . Lymphedema 08/16/2016  . Acute on chronic heart failure with preserved ejection fraction (HFpEF) ([place]) 08/09/2016  . Acquired lymphedema of leg 04/21/2016  . Nocturia 11/05/2015  . Urinary frequency 11/05/2015  . Acute respiratory failure with hypoxia ([place]) 09/05/2015  . 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 Incontinence 04/20/2015  . Diabetes mellitus, type 2 ([place]) 04/17/2015  . ESBL (extended spectrum beta-lactamase) producing bacteria infection 04/17/2015  . Hypertension 04/17/2015  . Frequent UTI 04/17/2015  . Absence of bladder continence 04/17/2015  . Iron deficiency anemia 03/08/2015  . Symptomatic anemia 09/25/2014  . Abdominal pain, lower 09/25/2014  . Urge incontinence 02/19/2013  . Bladder infection, chronic 08/11/2012  . Difficult or painful urination 07/26/2012  . Lower urinary tract infection 12/31/2011  .  Diabetes mellitus ([place]) 12/31/2011   PCP:  System, Provider Not In Pharmacy:   [place], [place] [address] [place] [address] Phone: [phone] Fax: [phone]  [place], [place] [address] Phone: [phone] Fax: [phone]     Social Determinants of Health (SDOH) Interventions    Readmission Risk Interventions Readmission Risk Prevention Plan 07/04/2019 03/19/2019  Transportation Screening Complete Complete  Medication Review Press photographer) Complete Complete  PCP or Specialist appointment within 3-5 days of discharge Complete Complete  HRI or Home Care Consult Complete Complete  SW Recovery Care/Counseling Consult Complete Not Complete  SW Consult Not Complete Comments - na  Palliative Care Screening Not Applicable Not [place] Patient Refused Not Applicable  Some recent data might be hidden

## 2019-09-15 NOTE — Progress Notes (Signed)
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  She initially presented to ED from dialysis where she was hypotensive and tachycardic.  Dialysis session had to be stopped early.  She was diagnosed with Covid 19 on 11 4 at ALF.  She did not have similar symptoms in the been out of hospital.  She denied chest pain.  At the time of presentation to [facility], she had troponin elevation of 2336, global ST depression, Tuesday 1 atrial flutter on EKG.  She was also in atrial flutter with RVR, cardiology was consulted for non-STEMI and atrial flutter.  She was started on heparin infusion with beta-blockers.  Plan was to pursue cardiac catheterization however she became hypotensive in [place] ED and was subsequently stabilized, blood pressure was stabilized and patient transferred to [facility].    Subjective   Patient seen and examined, denies chest pain or shortness of breath.  She has been weaned off oxygen.  Inflammatory markers have improved.   Assessment/Plan:     1. Non-STEMI/HFpEF-patient had troponin elevation to 2336 with EKG changes.  Cardiology at [facility] started patient on heparin.  Continue aspirin, statin, beta-blockers.  There was a plan for cardiac catheterization.  Cardiology following, no plans for cardiac cath at this time due to Covid infection.  Continue medical management.  2. Atrial flutter with RVR-patient going in and out of flutter, currently in normal sinus rhythm.  Continue heparin protocol, metoprolol 37.5 mg p.o. every 6 hours.  3. ESRD on  hemodialysis MWF-nephrology has been consulted and patient undergoing hemodialysis in the hospital.   [place]    09/12/19 1850 09/13/19 0104 09/14/19 0619 09/14/19 0823 09/15/19 0615  DDIMER  --  1.43* 1.55*  --  1.33*  FERRITIN 465*  --   --  69 390*  CRP 14.3*  --   --  6.0* 3.4*   COVID-19 infection-patient has been tested positive for COVID-19, she is currently asymptomatic.  Chest x-ray does not show infectious process.  Patient did have elevated CRP, 11.7, ferritin 392 at the time of admission.  Repeat Covid labs show CRP 14.3, ferritin 465.  Started on remdesivir per pharmacy, Decadron 6 mg p.o. daily.CRP is down to 3.4.  4. Hypertension-blood pressure stable, continue metoprolol.  5. Diabetes mellitus-glucose is elevated after patient started on Decadron.  Lantus was changed to 35 units subcu daily.   Will add NovoLog 4 units 3 times daily meal coverage.  6. History of seizure disorder-continue Keppra.  7. Constipation-we will give Dulcolax suppository 10 mg PR x1     CBG: Recent Labs  Lab 09/14/19 2101 09/14/19 2336 09/15/19 0424 09/15/19 0816 09/15/19 1234  GLUCAP 458* 431* 329* 285* 307*    CBC: Recent Labs  Lab 09/11/19 0645 09/11/19 2340 09/13/19 0104 09/14/19 0823 09/15/19 0615  WBC 13.3* 10.3 8.1 8.6 11.5*  NEUTROABS  --  6.8 4.8 6.5 8.9*  HGB 9.1* 9.6* 10.1* 9.4* 9.3*  HCT 29.2* 31.4* 32.7* 30.4* 30.0*  MCV 89.0 92.4 93.2 93.5 92.0  PLT 396 439* 371 414* 099    Basic Metabolic Panel: Recent Labs  Lab 09/11/19 2340  09/12/19 1010 09/13/19 0104 09/14/19 0823 09/14/19 0824 09/15/19 0615  NA 135 133* 137 134* 132* 130*  K 3.9 3.8 4.1 4.7 4.6 4.2  CL 98 96* 98 96* 95* 93*  CO2 26 26 23 26 26 24   GLUCOSE 195* 172* 195* 285* 282* 353*  BUN 38* 41* 23 34* 34* 27*  CREATININE 6.90* 6.99* 5.25* 6.55* 6.53* 5.08*  CALCIUM 9.2 9.1 9.0 9.8 9.7 8.8*  MG 2.0  --  2.0 2.2  --  1.9  PHOS 3.5 4.0 3.1 3.3 3.2 3.0    COVID-19  Labs  Recent Labs    09/12/19 1850 09/13/19 0104 09/14/19 0619 09/14/19 0823 09/15/19 0615  DDIMER  --  1.43* 1.55*  --  1.33*  FERRITIN 465*  --   --  69 390*  CRP 14.3*  --   --  6.0* 3.4*    Lab Results  Component Value Date   SARSCOV2NAA POSITIVE (A) 09/10/2019   [place] NEGATIVE 07/04/2019   [place] NEGATIVE 07/02/2019   [place] NEGATIVE 05/29/2019    DVT prophylaxis: Heparin  Code Status: Full code  Family Communication: No family at bedside  Disposition Plan: likely home when medically ready for discharge  Pressure Injury 09/12/19 Sacrum Medial Stage I -  Intact skin with non-blanchable redness of a localized area usually over a bony prominence. (Active)  09/12/19 0010  Location: Sacrum  Location Orientation: Medial  Staging: Stage I -  Intact skin with non-blanchable redness of a localized area usually over a bony prominence.  Wound Description (Comments):   Present on Admission: Yes     BMI  Estimated body mass index is 42.47 kg/m as calculated from the following:   Height as of this encounter: 5\' 7"  (1.702 m).   Weight as of this encounter: 123 kg.  Scheduled medications:  . aspirin  81 mg Oral Daily  . atorvastatin  40 mg Oral q1800  . calcium acetate  1,334 mg Oral TID AC  . Chlorhexidine Gluconate Cloth  6 each Topical Q0600  . Chlorhexidine Gluconate Cloth  6 each Topical Q0600  . citalopram  10 mg Oral Daily  . dexamethasone  6 mg Oral Daily  . docusate sodium  100 mg Oral BID  . insulin aspart  0-15 Units Subcutaneous TID WC  . insulin aspart  4 Units Subcutaneous TID WC  . insulin glargine  35 Units Subcutaneous QHS  . lamoTRIgine  25 mg Oral Daily  . levETIRAcetam  250 mg Oral BID  . mouth rinse  15 mL Mouth Rinse BID  . metoprolol tartrate  75 mg Oral BID  . mirabegron ER  25 mg Oral Daily  . OLANZapine  5 mg Oral QHS  . pantoprazole  40 mg Oral Daily  . senna  1 tablet Oral BID  . sodium chloride flush  3 mL  Intravenous Q12H  . torsemide  40 mg Oral Once per day on Sun Tue Thu Sat  . vitamin C  500 mg Oral Daily  . zinc sulfate  220 mg Oral Daily    Consultants:  Cardiology  Nephrology  Procedures:     Antibiotics:   Anti-infectives (From admission, onward)   Start     Dose/Rate Route Frequency Ordered Stop   09/14/19 1000  remdesivir 100 mg in sodium chloride 0.9 % 250 mL IVPB     100 mg 500 mL/hr over 30 Minutes Intravenous Every 24 hours 09/13/19 0858 09/18/19 0959   09/13/19 1000  remdesivir 200  mg in sodium chloride 0.9 % 250 mL IVPB     200 mg 500 mL/hr over 30 Minutes Intravenous Once 09/13/19 0858 09/13/19 1126   09/12/19 2217  remdesivir 100 mg in sodium chloride 0.9 % 250 mL IVPB  Status:  Discontinued     100 mg 500 mL/hr over 30 Minutes Intravenous Every 24 hours 09/11/19 2224 09/11/19 2252   09/11/19 2230  remdesivir 200 mg in sodium chloride 0.9 % 250 mL IVPB  Status:  Discontinued     200 mg 500 mL/hr over 30 Minutes Intravenous Once 09/11/19 2224 09/11/19 2252       Objective   Vitals:   09/15/19 0700 09/15/19 0800 09/15/19 0900 09/15/19 1000  BP: 138/63 (!) 132/53 (!) 141/68 (!) 142/58  Pulse: 78 64 95 75  Resp: 19 19 19  (!) 24  Temp:      TempSrc:      SpO2: 94% 95% 91% 93%  Weight:      Height:        Intake/Output Summary (Last 24 hours) at 09/15/2019 1259 Last data filed at 09/15/2019 0600 Gross per 24 hour  Intake 600.79 ml  Output -  Net 600.79 ml   Filed Weights   09/11/19 2048 09/12/19 1256 09/12/19 1644  Weight: 124.4 kg 125.5 kg 123 kg     Physical Examination:   General-appears in no acute distress Heart-S1-S2, regular, no murmur auscultated Lungs-clear to auscultation bilaterally, no wheezing or crackles auscultated Abdomen-soft, nontender, no organomegaly Extremities-no edema in the lower extremities Neuro-alert, oriented x3, no focal deficit noted   Data Reviewed: I have personally reviewed following labs and  imaging studies   Recent Results (from the past 240 hour(s))  SARS CORONAVIRUS 2 (TAT 6-24 HRS) Nasopharyngeal Nasopharyngeal Swab     Status: Abnormal   Collection Time: 09/10/19  1:24 PM   Specimen: Nasopharyngeal Swab  Result Value Ref Range Status   SARS Coronavirus 2 POSITIVE (A) NEGATIVE Final    Comment: RESULT CALLED TO, READ BACK BY AND VERIFIED WITH: M.JOHNSON RN 2022 09/10/2019 MCCORMICK K (NOTE) SARS-CoV-2 target nucleic acids are DETECTED. The SARS-CoV-2 RNA is generally detectable in upper and lower respiratory specimens during the acute phase of infection. Positive results are indicative of active infection with SARS-CoV-2. Clinical  correlation with patient history and other diagnostic information is necessary to determine patient infection status. Positive results do  not rule out bacterial infection or co-infection with other viruses. The expected result is Negative. Fact Sheet for Patients: SugarRoll.be Fact Sheet for Healthcare Providers: https://www.woods-mathews.com/ This test is not yet approved or cleared by the [place] FDA and  has been authorized for detection and/or diagnosis of SARS-CoV-2 by FDA under an Emergency Use Authorization (EUA). This EUA will remain  in effect (meaning this test can be used)  for the duration of the COVID-19 declaration under Section 564(b)(1) of the Act, 21 U.S.C. section 360bbb-3(b)(1), unless the authorization is terminated or revoked sooner. Performed at [facility], [place] [address], [place], [place] [address]   MRSA PCR Screening     Status: None   Collection Time: 09/11/19  9:01 PM   Specimen: Nasopharyngeal  Result Value Ref Range Status   MRSA by PCR NEGATIVE NEGATIVE Final    Comment:        The GeneXpert MRSA Assay (FDA approved for NASAL specimens only), is one component of a comprehensive MRSA colonization surveillance program. It is not intended to  diagnose MRSA infection nor to  guide or monitor treatment for MRSA infections. Performed at [facility], [place] [address] [place], [place] [address]      Liver Function Tests: Recent Labs  Lab 09/11/19 2340 09/12/19 1010 09/13/19 0104 09/14/19 0823 09/14/19 0824 09/15/19 0615  AST 16  --  15 12*  --  17  ALT 13  --  11 11  --  12  ALKPHOS 73  --  70 67  --  73  BILITOT 1.0  --  0.8 0.5  --  0.8  PROT 6.4*  --  6.4* 6.4*  --  6.3*  ALBUMIN 2.8* 2.7* 2.7* 2.7* 2.7* 2.9*   No results for input(s): LIPASE, AMYLASE in the last 168 hours. No results for input(s): AMMONIA in the last 168 hours.  Cardiac Enzymes: No results for input(s): CKTOTAL, CKMB, CKMBINDEX, TROPONINI in the last 168 hours. BNP (last 3 results) Recent Labs    03/02/19 0530 07/02/19 1734 09/10/19 1329  BNP 867.0* 97.0 869.0*    ProBNP (last 3 results) No results for input(s): PROBNP in the last 8760 hours.    Studies: No results found.   Admission status: Inpatient: Based on patients clinical presentation and evaluation of above clinical data, I have made determination that patient meets Inpatient criteria at this time.   [facility] Pager [phone]. If 7PM-7AM, please contact night-coverage at [email or web address], Office  [phone]  password [email or web address]  09/15/2019, 12:59 PM  LOS: 4 days

## 2019-09-15 NOTE — Progress Notes (Signed)
Results for ELEXA, KIVI (MRN [id number]) as of 09/15/2019 10:05  Ref. Range 09/14/2019 17:05 09/14/2019 21:01 09/14/2019 23:36 09/15/2019 04:24 09/15/2019 08:16  Glucose-Capillary Latest Ref Range: 70 - 99 mg/dL 373 (H) 458 (H) 431 (H) 329 (H) 285 (H)  Received diabetes coordinator consult. Noted that blood sugars have been greater than 300 mg/dl over last night.   Noted that Lantus was increased to 35 units at HS. Titrate dosages as needed.  Recommend increasing Novolog to RESISTANT correction scale every 4 hours if blood sugars continue to be greater than 180 mg/dl.   Will continue to monitor blood sugars while in the hospital.  Harvel Ricks RN BSN CDE Diabetes Coordinator Pager: [phone]  8am-5pm

## 2019-09-15 NOTE — Progress Notes (Signed)
[facility] Progress Note  Name: Carolyn Valdez MRN: [id number] DOB: [birth year]  Chief Complaint:  Hypotension at HD earlier this week  Subjective:  Spoke with nursing.  Had HD on 11/27 with 2 kg UF.  Rate has been controlled overnight per nursing.   She has been weaned to RA  Review of systems:  Denies shortness of breath Nausea last night which is better - no PRN needed  No cpain  --------------- Background on consult:  Carolyn Valdez is a 78 y.o. female with a history of ESRD on HD MWF via tunneled catheter RIJ, chronic heart failure with preserved EF, severe pulmonary HTN, afib/flutter who presented to [facility] from [facility] for dialysis.  She has been diagnosed with covid .  Troponin was elevated at the OSH and cardiology was consulted for for same and a flutter.  Cath is under consideration though per their note is not currently planned.  She has been on heparin and metoprolol.  She states last HD was on Monday this week and was stopped due to hypotension.  Nursing attempted to run HD today and catheter not working well - BF of 200 - catheter was ordered for TPA and treatment post-poned.  She is a patient at [facility].  She has had some shortness of breath and some nausea - no dizziness or cramping.  Hasn't had a fistula placed yet.  Intake/Output Summary (Last 24 hours) at 09/15/2019 0724 Last data filed at 09/15/2019 0300 Gross per 24 hour  Intake 639.7 ml  Output 2000 ml  Net -1360.3 ml    Vitals:  Vitals:   09/15/19 0200 09/15/19 0300 09/15/19 0400 09/15/19 0427  BP: 137/61 (!) 140/54 (!) 112/58 (!) 139/58  Pulse: 71 77 69 72  Resp: 19 17 19    Temp:   97.6 F (36.4 C)   TempSrc:   Oral   SpO2: 95% 93% 94%   Weight:      Height:         Physical Exam:  examined in person on 11/27 with appropriate PPE and spoke with nursing on 11/28 Due to the nature of this patient's suspected COVID-19 with isolation and in keeping with efforts to prevent the spread of  infection and to conserve personal protective equipment, a physical exam was not personally performed.  Patient's symptoms and exam were discussed in detail with the RN.  A chart review of other providers notes and the patient's lab work as well as review of other pertinent studies was performed.  Exam details from prior documentation were reviewed specifically and confirmed with the bedside nurse.  Location of service: [place] adult female in bed in no acute distress   HEENT normocephalic atraumatic Lungs reduced breath sounds with normal work of breathing at rest Heart S1S2; irregular Abdomen softly dis/obese habitus Extremities trace edema lower extremities per nursing Psych normal mood and affect Neuro - alert and oriented x 3 Access: RIJ tunneled catheter   Medications reviewed   Labs:  BMP Latest Ref Rng & Units 09/14/2019 09/14/2019 09/13/2019  Glucose 70 - 99 mg/dL 282(H) 285(H) 195(H)  BUN 8 - 23 mg/dL 34(H) 34(H) 23  Creatinine 0.44 - 1.00 mg/dL 6.53(H) 6.55(H) 5.25(H)  BUN/Creat Ratio 12 - 28 - - -  Sodium 135 - 145 mmol/L 132(L) 134(L) 137  Potassium 3.5 - 5.1 mmol/L 4.6 4.7 4.1  Chloride 98 - 111 mmol/L 95(L) 96(L) 98  CO2 22 - 32 mmol/L 26 26 23  Calcium 8.9 - 10.3 mg/dL 9.7 9.8 9.0    [place] - [phone]  Dialysis Orders:  [place] - MWF  4 hours EDW 124.5 kg 2 K / 2.5 calcium  BF 400  DF 800  Last tx and post weight: 11/23 with post weight of 123.6 kg (stopped early after 40 minutes due to hypotension).  Previous tx was 11/20 - 126 kg departure weight  Heparin 1700 unit load and 1500 units/hr for clotting  Anemia: epo 1200 units each tx; no iron currently   No hectorol   Assessment/Plan:   # ESRD   - Normally MWF schedule - Continue HD per MWF schedule  - renal diabetic diet  # A flutter/A fib with RVR   - Plan per cardiology   - on metoprolol which was adjusted per cardiology - rate  improved  # Heart failure with preserved EF  - Volume management with HD - torsemide non HD days per outpt  # Covid 19 infection - therapies per primary team  - optimize volume status as able   # Anemia CKD   - Started aranesp 40 mcg once on 11/28   Claudia Desanctis, MD 09/15/2019 7:24 AM

## 2019-09-15 NOTE — Progress Notes (Signed)
ANTICOAGULATION CONSULT NOTE - Follow Up Consult  Pharmacy Consult for heparin Indication: chest pain/ACS and atrial fibrillation  Allergies  Allergen Reactions  . Sulfa Antibiotics Hives  . Biaxin [Clarithromycin] Hives  . Buspar [Buspirone]   . Influenza A (H1n1) Monoval Vac Other (See Comments)    Pt states that she was told by her MD not to get the influenza vaccine.    . Morphine Other (See Comments)    Reaction:  Dizziness and confusion   . Pyridium [Phenazopyridine Hcl] Other (See Comments)    Reaction:  Unknown   . Tuberculin Tests   . Ceftriaxone Anxiety  . Latex Rash  . Prednisone Rash  . Tape Rash    Patient Measurements: Height: 5\' 7"  (170.2 cm) Weight: 271 lb 2.7 oz (123 kg) IBW/kg (Calculated) : 61.6 Heparin Dosing Weight: 91 kg  Vital Signs: Temp: 97.6 F (36.4 C) (11/28 0400) Temp Source: Oral (11/28 0400) BP: 142/58 (11/28 1000) Pulse Rate: 75 (11/28 1000)  Labs: Recent Labs    09/13/19 0104 09/14/19 0619 09/14/19 0823 09/14/19 0824 09/15/19 0615  HGB 10.1*  --  9.4*  --  9.3*  HCT 32.7*  --  30.4*  --  30.0*  PLT 371  --  414*  --  397  HEPARINUNFRC 0.40 0.46  --   --  0.51  CREATININE 5.25*  --  6.55* 6.53* 5.08*    Estimated Creatinine Clearance: 12.8 mL/min (A) (by C-G formula based on SCr of 5.08 mg/dL (H)).   Assessment: 66 yof admitted with atrial fibrillation and NSTEMI from [facility]. No anticoagulation PTA. Her CHADSVASC score is 5. Positive troponins on admission with a peak of 2336. Of note, she was COVID + at her ALF 11/4. Per cardiology, a cardioversion and cardiac cath may be planned in the future.   Heparin level this morning remains therapeutic (HL 0.51 << 0.46, goal of 0.3-0.7). CBC stable - no bleeding noted.   Goal of Therapy:  Heparin level 0.3-0.7 units/ml Monitor platelets by anticoagulation protocol: Yes   Plan:  - Continue Heparin at 1950 units/hr (19.5 ml/hr) - Will continue to monitor for any signs/symptoms of  bleeding and will follow up with heparin level in the a.m.   Thank you for allowing pharmacy to be a part of this patient's care.  Alycia Rossetti, PharmD, BCPS Clinical Pharmacist Clinical phone for 09/15/2019: [phone] 09/15/2019 1:16 PM   **Pharmacist phone directory can now be found on [email or web address] ([email or web address]).  Listed under [place].

## 2019-09-16 LAB — CBC WITH DIFFERENTIAL/PLATELET
Abs Immature Granulocytes: 0.24 10*3/uL — ABNORMAL HIGH (ref 0.00–0.07)
Basophils Absolute: 0 10*3/uL (ref 0.0–0.1)
Basophils Relative: 0 %
Eosinophils Absolute: 0 10*3/uL (ref 0.0–0.5)
Eosinophils Relative: 0 %
HCT: 31.6 % — ABNORMAL LOW (ref 36.0–46.0)
Hemoglobin: 9.9 g/dL — ABNORMAL LOW (ref 12.0–15.0)
Immature Granulocytes: 2 %
Lymphocytes Relative: 11 %
Lymphs Abs: 1.3 10*3/uL (ref 0.7–4.0)
MCH: 28.9 pg (ref 26.0–34.0)
MCHC: 31.3 g/dL (ref 30.0–36.0)
MCV: 92.4 fL (ref 80.0–100.0)
Monocytes Absolute: 1 10*3/uL (ref 0.1–1.0)
Monocytes Relative: 8 %
Neutro Abs: 9.7 10*3/uL — ABNORMAL HIGH (ref 1.7–7.7)
Neutrophils Relative %: 79 %
Platelets: 398 10*3/uL (ref 150–400)
RBC: 3.42 MIL/uL — ABNORMAL LOW (ref 3.87–5.11)
RDW: 15.2 % (ref 11.5–15.5)
WBC: 12.3 10*3/uL — ABNORMAL HIGH (ref 4.0–10.5)
nRBC: 0 % (ref 0.0–0.2)

## 2019-09-16 LAB — GLUCOSE, CAPILLARY
Glucose-Capillary: 422 mg/dL — ABNORMAL HIGH (ref 70–99)
Glucose-Capillary: 398 mg/dL — ABNORMAL HIGH (ref 70–99)
Glucose-Capillary: 301 mg/dL — ABNORMAL HIGH (ref 70–99)
Glucose-Capillary: 366 mg/dL — ABNORMAL HIGH (ref 70–99)
Glucose-Capillary: 426 mg/dL — ABNORMAL HIGH (ref 70–99)
Glucose-Capillary: 336 mg/dL — ABNORMAL HIGH (ref 70–99)
Glucose-Capillary: 343 mg/dL — ABNORMAL HIGH (ref 70–99)

## 2019-09-16 LAB — COMPREHENSIVE METABOLIC PANEL
ALT: 14 U/L (ref 0–44)
AST: 17 U/L (ref 15–41)
Albumin: 3 g/dL — ABNORMAL LOW (ref 3.5–5.0)
Alkaline Phosphatase: 70 U/L (ref 38–126)
Anion gap: 14 (ref 5–15)
BUN: 44 mg/dL — ABNORMAL HIGH (ref 8–23)
CO2: 22 mmol/L (ref 22–32)
Calcium: 9.2 mg/dL (ref 8.9–10.3)
Chloride: 93 mmol/L — ABNORMAL LOW (ref 98–111)
Creatinine, Ser: 5.92 mg/dL — ABNORMAL HIGH (ref 0.44–1.00)
GFR calc Af Amer: 7 mL/min — ABNORMAL LOW (ref >60)
GFR calc non Af Amer: 6 mL/min — ABNORMAL LOW (ref >60)
Glucose, Bld: 427 mg/dL — ABNORMAL HIGH (ref 70–99)
Potassium: 5.2 mmol/L — ABNORMAL HIGH (ref 3.5–5.1)
Sodium: 129 mmol/L — ABNORMAL LOW (ref 135–145)
Total Bilirubin: 0.6 mg/dL (ref 0.3–1.2)
Total Protein: 6.5 g/dL (ref 6.5–8.1)

## 2019-09-16 LAB — MAGNESIUM: Magnesium: 2 mg/dL (ref 1.7–2.4)

## 2019-09-16 LAB — FERRITIN: Ferritin: 269 ng/mL (ref 11–307)

## 2019-09-16 LAB — D-DIMER, QUANTITATIVE: D-Dimer, Quant: 1.37 ug/mL-FEU — ABNORMAL HIGH (ref 0.00–0.50)

## 2019-09-16 LAB — C-REACTIVE PROTEIN: CRP: 2.3 mg/dL — ABNORMAL HIGH (ref <1.0)

## 2019-09-16 LAB — PHOSPHORUS: Phosphorus: 4.7 mg/dL — ABNORMAL HIGH (ref 2.5–4.6)

## 2019-09-16 LAB — HEPARIN LEVEL (UNFRACTIONATED)
Heparin Unfractionated: 0.83 IU/mL — ABNORMAL HIGH (ref 0.30–0.70)
Heparin Unfractionated: 0.95 IU/mL — ABNORMAL HIGH (ref 0.30–0.70)

## 2019-09-16 MED ORDER — ALUM & MAG HYDROXIDE-SIMETH 200-200-20 MG/5ML PO SUSP
30.0000 mL | ORAL | Status: DC | PRN
  Filled 2019-09-16: qty 30

## 2019-09-16 MED ORDER — SODIUM POLYSTYRENE SULFONATE 15 GM/60ML PO SUSP: 15.0000 g | Freq: Once | ORAL | Status: DC

## 2019-09-16 MED ORDER — CHLORHEXIDINE GLUCONATE CLOTH 2 % EX PADS
6.0000 | MEDICATED_PAD | Freq: Every day | CUTANEOUS | Status: DC
  Administered 2019-09-17 – 2019-10-02 (×15): 6 via TOPICAL

## 2019-09-16 MED ORDER — INSULIN ASPART 100 UNIT/ML ~~LOC~~ SOLN
6.0000 [IU] | Freq: Three times a day (TID) | SUBCUTANEOUS | Status: DC
  Administered 2019-09-16 – 2019-09-17 (×4): 6 [IU] via SUBCUTANEOUS

## 2019-09-16 MED ORDER — SODIUM CHLORIDE 0.9 % IV SOLN
2.0000 g | INTRAVENOUS | Status: DC
  Administered 2019-09-16 – 2019-09-19 (×4): 2 g via INTRAVENOUS
  Filled 2019-09-16 (×5): qty 20

## 2019-09-16 MED ORDER — INSULIN ASPART 100 UNIT/ML ~~LOC~~ SOLN
0.0000 [IU] | SUBCUTANEOUS | Status: DC
  Administered 2019-09-16: 15 [IU] via SUBCUTANEOUS
  Administered 2019-09-16: 20 [IU] via SUBCUTANEOUS
  Administered 2019-09-16: 15 [IU] via SUBCUTANEOUS
  Administered 2019-09-16: 20 [IU] via SUBCUTANEOUS
  Administered 2019-09-17: 7 [IU] via SUBCUTANEOUS
  Administered 2019-09-17 (×2): 11 [IU] via SUBCUTANEOUS

## 2019-09-16 MED ORDER — SODIUM ZIRCONIUM CYCLOSILICATE 10 G PO PACK
10.0000 g | PACK | Freq: Once | ORAL | Status: AC
  Administered 2019-09-16: 10 g via ORAL
  Filled 2019-09-16: qty 1

## 2019-09-16 NOTE — Progress Notes (Signed)
ANTICOAGULATION CONSULT NOTE - Follow Up Consult  Pharmacy Consult for heparin Indication: chest pain/ACS and atrial fibrillation  Allergies  Allergen Reactions  . Sulfa Antibiotics Hives  . Biaxin [Clarithromycin] Hives  . Buspar [Buspirone]   . Influenza A (H1n1) Monoval Vac Other (See Comments)    Pt states that she was told by her MD not to get the influenza vaccine.    . Morphine Other (See Comments)    Reaction:  Dizziness and confusion   . Pyridium [Phenazopyridine Hcl] Other (See Comments)    Reaction:  Unknown   . Tuberculin Tests   . Ceftriaxone Anxiety  . Latex Rash  . Prednisone Rash  . Tape Rash    Patient Measurements: Height: 5\' 7"  (170.2 cm) Weight: 271 lb 2.7 oz (123 kg) IBW/kg (Calculated) : 61.6 Heparin Dosing Weight: 91 kg  Vital Signs: Temp: 97.8 F (36.6 C) (11/29 0448) Temp Source: Oral (11/29 0448) BP: 123/54 (11/29 1100) Pulse Rate: 61 (11/29 1100)  Labs: Recent Labs    09/14/19 2841  09/14/19 0823 09/14/19 0824 09/15/19 0615 09/16/19 0424  HGB  --    < > 9.4*  --  9.3* 9.9*  HCT  --   --  30.4*  --  30.0* 31.6*  PLT  --   --  414*  --  397 398  HEPARINUNFRC 0.46  --   --   --  0.51 0.83*  CREATININE  --    < > 6.55* 6.53* 5.08* 5.92*   < > = values in this interval not displayed.    Estimated Creatinine Clearance: 11 mL/min (A) (by C-G formula based on SCr of 5.92 mg/dL (H)).   Assessment: 70 yof admitted with atrial fibrillation and NSTEMI from [facility]. No anticoagulation PTA. Her CHADSVASC score is 5. Positive troponins on admission with a peak of 2336. Of note, she was COVID + at her ALF 11/4. Per cardiology, a cardioversion and cardiac cath may be planned in the future.   Heparin level this afternoon remains SUPRAtherapeutic after a rate decrease earlier today (HL 0.95 << 0.83, goal of 0.3-0.7).   Rate decrease has not been charted on the [facility] but it was confirmed with the RN that the drip is infusing at 1850 units/hr. Drip going  in left-AC and lab drawing from hand - so should be distal enough to not effect the level. Will reduce again and ask phlebotomy to hold the drip for 10 minutes prior to the evening draw. CBC stable - no bleeding noted.   Goal of Therapy:  Heparin level 0.3-0.7 units/ml Monitor platelets by anticoagulation protocol: Yes   Plan:  - Reduce Heparin to 1650 units/hr (16.5 ml/hr) - Will continue to monitor for any signs/symptoms of bleeding and will follow up with heparin level in 8 hours   Thank you for allowing pharmacy to be a part of this patient's care.  Alycia Rossetti, PharmD, BCPS Clinical Pharmacist Clinical phone for 09/16/2019: [phone] 09/16/2019 2:10 PM   **Pharmacist phone directory can now be found on [email or web address] ([email or web address]).  Listed under [place].

## 2019-09-16 NOTE — Progress Notes (Signed)
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  She initially presented to ED from dialysis where she was hypotensive and tachycardic.  Dialysis session had to be stopped early.  She was diagnosed with Covid 19 on 11 4 at ALF.  She did not have similar symptoms in the been out of hospital.  She denied chest pain.  At the time of presentation to [facility], she had troponin elevation of 2336, global ST depression, Tuesday 1 atrial flutter on EKG.  She was also in atrial flutter with RVR, cardiology was consulted for non-STEMI and atrial flutter.  She was started on heparin infusion with beta-blockers.  Plan was to pursue cardiac catheterization however she became hypotensive in [place] ED and was subsequently stabilized, blood pressure was stabilized and patient transferred to [facility].    Subjective   Patient seen and examined, complains of mild shortness of breath.  Placed on oxygen, 3 L/min.   Assessment/Plan:     1. Non-STEMI/HFpEF-patient had troponin elevation to 2336 with EKG changes.  Cardiology at [facility] started patient on heparin.  Continue aspirin, statin, beta-blockers.  There was a plan for cardiac catheterization.  Cardiology following, no plans for cardiac cath at this time due to Covid infection.  Continue medical management.  2. Atrial flutter with RVR-patient going in and out of flutter, currently in normal sinus rhythm.  Continue heparin protocol, metoprolol 37.5 mg p.o. every 6 hours.  3. ESRD on hemodialysis MWF-nephrology has been consulted and  patient undergoing hemodialysis in the hospital.   [place]    09/14/19 0619 09/14/19 0823 09/15/19 0615 09/16/19 0424  DDIMER 1.55*  --  1.33* 1.37*  FERRITIN  --  69 390* 269  CRP  --  6.0* 3.4* 2.3*   COVID-19 infection-patient has been tested positive for COVID-19, she is currently asymptomatic.  Chest x-ray does not show infectious process.  Patient did have elevated CRP, 11.7, ferritin 392 at the time of admission.  Repeat Covid labs show CRP 14.3, ferritin 465.  Started on remdesivir per pharmacy, Decadron 6 mg p.o. daily.CRP is down to 2.3.  4. Hypertension-blood pressure stable, continue metoprolol.  5. Diabetes mellitus-glucose is still elevated after patient started on Decadron.  Lantus was changed to 35 units subcu daily. Will add change NovoLog to 6 units 3 times daily with meals.  Change sliding scale to resistant every 4 hours.  6. History of seizure disorder-continue Keppra.       CBG: Recent Labs  Lab 09/15/19 2028 09/16/19 0021 09/16/19 0442 09/16/19 0754 09/16/19 1117  GLUCAP 385* 422* 398* 366* 426*    CBC: Recent Labs  Lab 09/11/19 2340 09/13/19 0104 09/14/19 0823 09/15/19 0615 09/16/19 0424  WBC 10.3 8.1 8.6 11.5* 12.3*  NEUTROABS 6.8 4.8 6.5 8.9* 9.7*  HGB 9.6* 10.1* 9.4* 9.3* 9.9*  HCT 31.4* 32.7* 30.4* 30.0* 31.6*  MCV 92.4 93.2 93.5 92.0 92.4  PLT 439* 371 414* 397 259    Basic Metabolic Panel: Recent Labs  Lab 09/11/19 2340  09/13/19 0104 09/14/19 0823 09/14/19 0824 09/15/19 0615 09/16/19 0424  NA 135   < > 137  134* 132* 130* 129*  K 3.9   < > 4.1 4.7 4.6 4.2 5.2*  CL 98   < > 98 96* 95* 93* 93*  CO2 26   < > 23 26 26 24 22   GLUCOSE 195*   < > 195* 285* 282* 353* 427*  BUN 38*   < > 23 34* 34* 27* 44*  CREATININE 6.90*   < > 5.25* 6.55* 6.53* 5.08* 5.92*  CALCIUM 9.2   < > 9.0 9.8 9.7 8.8* 9.2  MG 2.0  --  2.0 2.2  --  1.9 2.0  PHOS 3.5   < > 3.1 3.3 3.2 3.0 4.7*   < > = values in this interval not  displayed.    COVID-19 Labs  Recent Labs    09/14/19 0619 09/14/19 0823 09/15/19 0615 09/16/19 0424  DDIMER 1.55*  --  1.33* 1.37*  FERRITIN  --  69 390* 269  CRP  --  6.0* 3.4* 2.3*    Lab Results  Component Value Date   SARSCOV2NAA POSITIVE (A) 09/10/2019   [place] NEGATIVE 07/04/2019   [place] NEGATIVE 07/02/2019   [place] NEGATIVE 05/29/2019    DVT prophylaxis: Heparin  Code Status: Full code  Family Communication: No family at bedside  Disposition Plan: likely home when medically ready for discharge  Pressure Injury 09/12/19 Sacrum Medial Stage I -  Intact skin with non-blanchable redness of a localized area usually over a bony prominence. (Active)  09/12/19 0010  Location: Sacrum  Location Orientation: Medial  Staging: Stage I -  Intact skin with non-blanchable redness of a localized area usually over a bony prominence.  Wound Description (Comments):   Present on Admission: Yes     BMI  Estimated body mass index is 42.47 kg/m as calculated from the following:   Height as of this encounter: 5\' 7"  (1.702 m).   Weight as of this encounter: 123 kg.  Scheduled medications:  . aspirin  81 mg Oral Daily  . atorvastatin  40 mg Oral q1800  . calcium acetate  1,334 mg Oral TID AC  . Chlorhexidine Gluconate Cloth  6 each Topical Q0600  . Chlorhexidine Gluconate Cloth  6 each Topical Q0600  . citalopram  10 mg Oral Daily  . dexamethasone  6 mg Oral Daily  . docusate sodium  100 mg Oral BID  . insulin aspart  0-20 Units Subcutaneous Q4H  . insulin aspart  6 Units Subcutaneous TID WC  . insulin glargine  35 Units Subcutaneous QHS  . lamoTRIgine  25 mg Oral Daily  . levETIRAcetam  250 mg Oral BID  . mouth rinse  15 mL Mouth Rinse BID  . metoprolol tartrate  75 mg Oral BID  . mirabegron ER  25 mg Oral Daily  . OLANZapine  5 mg Oral QHS  . pantoprazole  40 mg Oral Daily  . senna  1 tablet Oral BID  . sodium chloride flush  3 mL Intravenous Q12H   . torsemide  40 mg Oral Once per day on Sun Tue Thu Sat  . vitamin C  500 mg Oral Daily  . zinc sulfate  220 mg Oral Daily    Consultants:  Cardiology  Nephrology  Procedures:     Antibiotics:   Anti-infectives (From admission, onward)   Start     Dose/Rate Route Frequency Ordered Stop   09/16/19 1115  cefTRIAXone (ROCEPHIN) 2 g in sodium chloride 0.9 % 100 mL IVPB     2 g 200  mL/hr over 30 Minutes Intravenous Every 24 hours 09/16/19 1110     09/14/19 1000  remdesivir 100 mg in sodium chloride 0.9 % 250 mL IVPB     100 mg 500 mL/hr over 30 Minutes Intravenous Every 24 hours 09/13/19 0858 09/18/19 0959   09/13/19 1000  remdesivir 200 mg in sodium chloride 0.9 % 250 mL IVPB     200 mg 500 mL/hr over 30 Minutes Intravenous Once 09/13/19 0858 09/13/19 1126   09/12/19 2217  remdesivir 100 mg in sodium chloride 0.9 % 250 mL IVPB  Status:  Discontinued     100 mg 500 mL/hr over 30 Minutes Intravenous Every 24 hours 09/11/19 2224 09/11/19 2252   09/11/19 2230  remdesivir 200 mg in sodium chloride 0.9 % 250 mL IVPB  Status:  Discontinued     200 mg 500 mL/hr over 30 Minutes Intravenous Once 09/11/19 2224 09/11/19 2252       Objective   Vitals:   09/16/19 0800 09/16/19 0900 09/16/19 1000 09/16/19 1100  BP: (!) 164/88 (!) 155/123 (!) 140/56 (!) 123/54  Pulse: 93 (!) 108 78 61  Resp: 15 (!) 24 18 17   Temp:      TempSrc:      SpO2: 98% 96% 100% 100%  Weight:      Height:        Intake/Output Summary (Last 24 hours) at 09/16/2019 1349 Last data filed at 09/16/2019 0600 Gross per 24 hour  Intake 945.12 ml  Output -  Net 945.12 ml   Filed Weights   09/11/19 2048 09/12/19 1256 09/12/19 1644  Weight: 124.4 kg 125.5 kg 123 kg     Physical Examination:  General-appears in no acute distress Heart-S1-S2, regular, no murmur auscultated Lungs-clear to auscultation bilaterally, no wheezing or crackles auscultated Abdomen-soft, nontender, no organomegaly Extremities-no  edema in the lower extremities Neuro-alert, oriented x3, no focal deficit noted   Data Reviewed: I have personally reviewed following labs and imaging studies   Recent Results (from the past 240 hour(s))  SARS CORONAVIRUS 2 (TAT 6-24 HRS) Nasopharyngeal Nasopharyngeal Swab     Status: Abnormal   Collection Time: 09/10/19  1:24 PM   Specimen: Nasopharyngeal Swab  Result Value Ref Range Status   SARS Coronavirus 2 POSITIVE (A) NEGATIVE Final    Comment: RESULT CALLED TO, READ BACK BY AND VERIFIED WITH: M.JOHNSON RN 2022 09/10/2019 MCCORMICK K (NOTE) SARS-CoV-2 target nucleic acids are DETECTED. The SARS-CoV-2 RNA is generally detectable in upper and lower respiratory specimens during the acute phase of infection. Positive results are indicative of active infection with SARS-CoV-2. Clinical  correlation with patient history and other diagnostic information is necessary to determine patient infection status. Positive results do  not rule out bacterial infection or co-infection with other viruses. The expected result is Negative. Fact Sheet for Patients: SugarRoll.be Fact Sheet for Healthcare Providers: https://www.woods-mathews.com/ This test is not yet approved or cleared by the [place] FDA and  has been authorized for detection and/or diagnosis of SARS-CoV-2 by FDA under an Emergency Use Authorization (EUA). This EUA will remain  in effect (meaning this test can be used)  for the duration of the COVID-19 declaration under Section 564(b)(1) of the Act, 21 U.S.C. section 360bbb-3(b)(1), unless the authorization is terminated or revoked sooner. Performed at [facility], [place] [address], [place], [place] [address]   MRSA PCR Screening     Status: None   Collection Time: 09/11/19  9:01 PM   Specimen: Nasopharyngeal  Result Value Ref Range Status   MRSA by PCR NEGATIVE NEGATIVE Final    Comment:        The GeneXpert MRSA Assay  (FDA approved for NASAL specimens only), is one component of a comprehensive MRSA colonization surveillance program. It is not intended to diagnose MRSA infection nor to guide or monitor treatment for MRSA infections. Performed at [facility], [place] [address] [place], [place] [address]      Liver Function Tests: Recent Labs  Lab 09/11/19 2340  09/13/19 0104 09/14/19 0823 09/14/19 0824 09/15/19 0615 09/16/19 0424  AST 16  --  15 12*  --  17 17  ALT 13  --  11 11  --  12 14  ALKPHOS 73  --  70 67  --  73 70  BILITOT 1.0  --  0.8 0.5  --  0.8 0.6  PROT 6.4*  --  6.4* 6.4*  --  6.3* 6.5  ALBUMIN 2.8*   < > 2.7* 2.7* 2.7* 2.9* 3.0*   < > = values in this interval not displayed.   No results for input(s): LIPASE, AMYLASE in the last 168 hours. No results for input(s): AMMONIA in the last 168 hours.  Cardiac Enzymes: No results for input(s): CKTOTAL, CKMB, CKMBINDEX, TROPONINI in the last 168 hours. BNP (last 3 results) Recent Labs    03/02/19 0530 07/02/19 1734 09/10/19 1329  BNP 867.0* 97.0 869.0*    ProBNP (last 3 results) No results for input(s): PROBNP in the last 8760 hours.    Studies: No results found.   Admission status: Inpatient: Based on patients clinical presentation and evaluation of above clinical data, I have made determination that patient meets Inpatient criteria at this time.   [facility] Pager [phone]. If 7PM-7AM, please contact night-coverage at [email or web address], Office  [phone]  password [email or web address]  09/16/2019, 1:49 PM  LOS: 5 days

## 2019-09-16 NOTE — Progress Notes (Signed)
[facility] Progress Note  Name: Carolyn Valdez MRN: [id number] DOB: 1941-08-27  Chief Complaint:  Hypotension at HD earlier this week  Subjective:  Had HD on 11/27 with 2 kg UF.  No urine output is charted for 11/28.  She has been on 1.5 liters oxygen when sleeping; had been on ra when awake.  Feels ok today  Review of systems:    She denies shortness of breath  States nausea better No nausea  No cp   --------------- Background on consult:  NIREL BABLER is a 78 y.o. female with a history of ESRD on HD MWF via tunneled catheter RIJ, chronic heart failure with preserved EF, severe pulmonary HTN, afib/flutter who presented to [facility] from [facility] for dialysis.  She has been diagnosed with covid .  Troponin was elevated at the OSH and cardiology was consulted for for same and a flutter.  Cath is under consideration though per their note is not currently planned.  She has been on heparin and metoprolol.  She states last HD was on Monday this week and was stopped due to hypotension.  Nursing attempted to run HD today and catheter not working well - BF of 200 - catheter was ordered for TPA and treatment post-poned.  She is a patient at [facility].  She has had some shortness of breath and some nausea - no dizziness or cramping.  Hasn't had a fistula placed yet.  Intake/Output Summary (Last 24 hours) at 09/16/2019 0733 Last data filed at 09/16/2019 0200 Gross per 24 hour  Intake 627.3 ml  Output -  Net 627.3 ml    Vitals:  Vitals:   09/16/19 0028 09/16/19 0100 09/16/19 0200 09/16/19 0448  BP:  113/70 (!) 129/59   Pulse:  86 70   Resp:  20 17   Temp: 98.3 F (36.8 C)   97.8 F (36.6 C)  TempSrc: Axillary   Oral  SpO2:  99% 100%   Weight:      Height:         Physical Exam:  examined in person on 11/29 with appropriate PPE  General adult female in bed in no acute distress   HEENT normocephalic atraumatic Lungs reduced breath sounds with normal work of breathing  at rest Heart S1S2; irregular - HR 60's Abdomen softly dis/obese habitus Extremities trace nonpitting edema lower extremities Psych normal mood and affect Neuro - alert and oriented x 3 Access: RIJ tunneled catheter   Medications reviewed   Labs:  BMP Latest Ref Rng & Units 09/16/2019 09/15/2019 09/14/2019  Glucose 70 - 99 mg/dL 427(H) 353(H) 282(H)  BUN 8 - 23 mg/dL 44(H) 27(H) 34(H)  Creatinine 0.44 - 1.00 mg/dL 5.92(H) 5.08(H) 6.53(H)  BUN/Creat Ratio 12 - 28 - - -  Sodium 135 - 145 mmol/L 129(L) 130(L) 132(L)  Potassium 3.5 - 5.1 mmol/L 5.2(H) 4.2 4.6  Chloride 98 - 111 mmol/L 93(L) 93(L) 95(L)  CO2 22 - 32 mmol/L 22 24 26   Calcium 8.9 - 10.3 mg/dL 9.2 8.8(L) 9.7    [place]  Dialysis Orders:  [place] - MWF  4 hours EDW 124.5 kg 2 K / 2.5 calcium  BF 400  DF 800  Last tx and post weight: 11/23 with post weight of 123.6 kg (stopped early after 40 minutes due to hypotension).  Previous tx was 11/20 - 126 kg departure weight  Heparin 1700 unit load and 1500 units/hr for clotting  Anemia: epo 1200 units  each tx; no iron currently   No hectorol   Assessment/Plan:   # ESRD   - Normally MWF schedule - Continue HD per MWF schedule  - renal diabetic diet; hyperglycemia with rising K - torsemide non HD days per prior regimen - lokelma once today - DM management per primary   # A flutter/A fib with RVR   - Plan per cardiology   - on metoprolol which was adjusted per cardiology - rate improved  # Heart failure with preserved EF  - Volume management with HD - torsemide non HD days per outpt  # Covid 19 infection - therapies per primary team  - optimize volume status as able   # Anemia CKD   - Started aranesp 40 mcg once on 11/28  # DM with CKD - diabetic diet - meds per primary; note dexamethasone with hyperglycemia/pseudohyponatremia.  Claudia Desanctis, MD 09/16/2019 7:33 AM

## 2019-09-16 NOTE — Progress Notes (Signed)
ANTICOAGULATION CONSULT NOTE - Follow Up Consult  Pharmacy Consult for heparin Indication: chest pain/ACS and atrial fibrillation   Patient Measurements: Height: 5\' 7"  (170.2 cm) Weight: 271 lb 2.7 oz (123 kg) IBW/kg (Calculated) : 61.6 Heparin Dosing Weight: 91 kg  Vital Signs: Temp: 97.8 F (36.6 C) (11/29 0448) Temp Source: Oral (11/29 0448) BP: 129/59 (11/29 0200) Pulse Rate: 70 (11/29 0200)  Labs: Recent Labs    09/14/19 0619  09/14/19 0823 09/14/19 0824 09/15/19 0615 09/16/19 0424  HGB  --    < > 9.4*  --  9.3* 9.9*  HCT  --   --  30.4*  --  30.0* 31.6*  PLT  --   --  414*  --  397 398  HEPARINUNFRC 0.46  --   --   --  0.51 0.83*  CREATININE  --    < > 6.55* 6.53* 5.08* 5.92*   < > = values in this interval not displayed.    Estimated Creatinine Clearance: 11 mL/min (A) (by C-G formula based on SCr of 5.92 mg/dL (H)).   Assessment: Carolyn Valdez admitted with atrial fibrillation and NSTEMI from [facility]. No anticoagulation PTA. Her CHADSVASC score is 5. Positive troponins on admission with a peak of 2336. Of note, she was COVID + at her ALF 11/4. Per cardiology, a cardioversion and cardiac cath may be planned in the future.   Heparin level this morning elevated.  No bleeding noted  Goal of Therapy:  Heparin level 0.3-0.7 units/ml Monitor platelets by anticoagulation protocol: Yes   Plan:  - Decrease heparin to 1850 units/hr (18.5 ml/hr) - Will continue to monitor for any signs/symptoms of bleeding and will follow up with heparin level later today  Thanks for allowing pharmacy to be a part of this patient's care.  Excell Seltzer, PharmD Clinical Pharmacist

## 2019-09-17 DIAGNOSIS — I248 Other forms of acute ischemic heart disease: Secondary | ICD-10-CM

## 2019-09-17 LAB — GLUCOSE, CAPILLARY
Glucose-Capillary: 236 mg/dL — ABNORMAL HIGH (ref 70–99)
Glucose-Capillary: 263 mg/dL — ABNORMAL HIGH (ref 70–99)
Glucose-Capillary: 264 mg/dL — ABNORMAL HIGH (ref 70–99)
Glucose-Capillary: 298 mg/dL — ABNORMAL HIGH (ref 70–99)
Glucose-Capillary: 349 mg/dL — ABNORMAL HIGH (ref 70–99)
Glucose-Capillary: 369 mg/dL — ABNORMAL HIGH (ref 70–99)

## 2019-09-17 LAB — HEPATITIS B SURFACE ANTIGEN: Hepatitis B Surface Ag: NONREACTIVE

## 2019-09-17 LAB — RENAL FUNCTION PANEL
Albumin: 2.7 g/dL — ABNORMAL LOW (ref 3.5–5.0)
Anion gap: 12 (ref 5–15)
BUN: 62 mg/dL — ABNORMAL HIGH (ref 8–23)
CO2: 23 mmol/L (ref 22–32)
Calcium: 8.9 mg/dL (ref 8.9–10.3)
Chloride: 93 mmol/L — ABNORMAL LOW (ref 98–111)
Creatinine, Ser: 6.94 mg/dL — ABNORMAL HIGH (ref 0.44–1.00)
GFR calc Af Amer: 6 mL/min — ABNORMAL LOW (ref >60)
GFR calc non Af Amer: 5 mL/min — ABNORMAL LOW (ref >60)
Glucose, Bld: 281 mg/dL — ABNORMAL HIGH (ref 70–99)
Phosphorus: 4.5 mg/dL (ref 2.5–4.6)
Potassium: 5 mmol/L (ref 3.5–5.1)
Sodium: 128 mmol/L — ABNORMAL LOW (ref 135–145)

## 2019-09-17 LAB — HEPATITIS B SURFACE ANTIBODY,QUALITATIVE: Hep B S Ab: NONREACTIVE

## 2019-09-17 LAB — C-REACTIVE PROTEIN: CRP: 1.3 mg/dL — ABNORMAL HIGH (ref <1.0)

## 2019-09-17 LAB — HEPATITIS B CORE ANTIBODY, TOTAL: Hep B Core Total Ab: NONREACTIVE

## 2019-09-17 LAB — HEPARIN LEVEL (UNFRACTIONATED)
Heparin Unfractionated: 0.33 IU/mL (ref 0.30–0.70)
Heparin Unfractionated: 0.49 IU/mL (ref 0.30–0.70)

## 2019-09-17 LAB — FERRITIN: Ferritin: 341 ng/mL — ABNORMAL HIGH (ref 11–307)

## 2019-09-17 MED ORDER — INSULIN GLARGINE 100 UNIT/ML ~~LOC~~ SOLN
45.0000 [IU] | Freq: Every day | SUBCUTANEOUS | Status: DC
  Administered 2019-09-17 – 2019-09-18 (×2): 45 [IU] via SUBCUTANEOUS
  Filled 2019-09-17 (×3): qty 0.45

## 2019-09-17 MED ORDER — WARFARIN - PHARMACIST DOSING INPATIENT
Freq: Every day | Status: DC
  Administered 2019-09-17 – 2019-10-01 (×7)

## 2019-09-17 MED ORDER — INSULIN ASPART 100 UNIT/ML ~~LOC~~ SOLN
0.0000 [IU] | Freq: Three times a day (TID) | SUBCUTANEOUS | Status: DC
  Administered 2019-09-17: 8 [IU] via SUBCUTANEOUS

## 2019-09-17 MED ORDER — WARFARIN SODIUM 7.5 MG PO TABS
7.5000 mg | ORAL_TABLET | Freq: Once | ORAL | Status: AC
  Administered 2019-09-17: 7.5 mg via ORAL
  Filled 2019-09-17: qty 1

## 2019-09-17 MED ORDER — INSULIN ASPART 100 UNIT/ML ~~LOC~~ SOLN
0.0000 [IU] | Freq: Three times a day (TID) | SUBCUTANEOUS | Status: DC
  Administered 2019-09-17: 11 [IU] via SUBCUTANEOUS
  Administered 2019-09-18: 8 [IU] via SUBCUTANEOUS
  Administered 2019-09-18: 11 [IU] via SUBCUTANEOUS
  Administered 2019-09-18 (×2): 3 [IU] via SUBCUTANEOUS
  Administered 2019-09-19: 8 [IU] via SUBCUTANEOUS
  Administered 2019-09-19: 5 [IU] via SUBCUTANEOUS
  Administered 2019-09-20: 15 [IU] via SUBCUTANEOUS
  Administered 2019-09-20: 11 [IU] via SUBCUTANEOUS
  Administered 2019-09-20 (×2): 8 [IU] via SUBCUTANEOUS
  Administered 2019-09-21: 15 [IU] via SUBCUTANEOUS
  Administered 2019-09-21: 11 [IU] via SUBCUTANEOUS
  Administered 2019-09-21 – 2019-09-22 (×3): 5 [IU] via SUBCUTANEOUS
  Administered 2019-09-23: 2 [IU] via SUBCUTANEOUS
  Administered 2019-09-23: 3 [IU] via SUBCUTANEOUS
  Administered 2019-09-23: 8 [IU] via SUBCUTANEOUS
  Administered 2019-09-24: 5 [IU] via SUBCUTANEOUS
  Administered 2019-09-25: 3 [IU] via SUBCUTANEOUS
  Administered 2019-09-25 (×2): 5 [IU] via SUBCUTANEOUS
  Administered 2019-09-25 – 2019-09-26 (×2): 3 [IU] via SUBCUTANEOUS
  Administered 2019-09-26: 5 [IU] via SUBCUTANEOUS
  Administered 2019-09-27: 8 [IU] via SUBCUTANEOUS
  Administered 2019-09-27 (×2): 3 [IU] via SUBCUTANEOUS
  Administered 2019-09-28 (×2): 5 [IU] via SUBCUTANEOUS
  Administered 2019-09-28: 3 [IU] via SUBCUTANEOUS
  Administered 2019-09-29: 5 [IU] via SUBCUTANEOUS
  Administered 2019-09-29: 3 [IU] via SUBCUTANEOUS
  Administered 2019-09-29: 5 [IU] via SUBCUTANEOUS
  Administered 2019-09-29: 3 [IU] via SUBCUTANEOUS
  Administered 2019-09-30: 5 [IU] via SUBCUTANEOUS
  Administered 2019-09-30: 8 [IU] via SUBCUTANEOUS
  Administered 2019-09-30 (×2): 5 [IU] via SUBCUTANEOUS
  Administered 2019-10-01: 2 [IU] via SUBCUTANEOUS
  Administered 2019-10-01: 5 [IU] via SUBCUTANEOUS
  Administered 2019-10-02: 3 [IU] via SUBCUTANEOUS
  Administered 2019-10-02: 2 [IU] via SUBCUTANEOUS
  Administered 2019-10-02: 3 [IU] via SUBCUTANEOUS

## 2019-09-17 MED ORDER — INSULIN ASPART 100 UNIT/ML ~~LOC~~ SOLN
8.0000 [IU] | Freq: Three times a day (TID) | SUBCUTANEOUS | Status: DC
  Administered 2019-09-17 – 2019-09-19 (×6): 8 [IU] via SUBCUTANEOUS

## 2019-09-17 NOTE — Progress Notes (Signed)
Inpatient Diabetes Program Recommendations  AACE/ADA: New Consensus Statement on Inpatient Glycemic Control (2015)  Target Ranges:  Prepandial:   less than 140 mg/dL      Peak postprandial:   less than 180 mg/dL (1-2 hours)      Critically ill patients:  140 - 180 mg/dL   Lab Results  Component Value Date   GLUCAP 264 (H) 09/17/2019   HGBA1C 7.9 (H) 09/10/2019    Review of Glycemic Control  Results for SIRENA, RIDDLE (MRN [id number]) as of 09/17/2019 13:37  Ref. Range 09/16/2019 23:52 09/17/2019 04:40 09/17/2019 08:30 09/17/2019 11:37  Glucose-Capillary Latest Ref Range: 70 - 99 mg/dL 369 (H) Novolog  20units 263 (H) Novolog 11units 236 (H) Novolog 13units 264 (H) Novolog 11units    Diabetes history: DM2 Outpatient Diabetes medications: Glipizide 5 mg QD + Novolog 0-5 TID with meals + Lantus 45u QHS Current orders for Inpatient glycemic control: Lantus 35u QD + Novolog 0-20 Q4HR + Novolog 6u with meals + decadron 6mg  QD  Inpatient Diabetes Program Recommendations:     NOTE: CBG's 200-300.  Fasting BS this morning 236.  Received Novolog 70 units correction since 9pm last night -Increase Lantus to home dose 45 units QD  -Increase meal coverage to 8u TID with meals if eats at least 50% -Decrease correction to Novolog 0-15 TID with meals and 0-5 HS   Thank you, Geoffry Paradise, RN, BSN Diabetes Coordinator Inpatient Diabetes Program [phone] (team pager from 8a-5p)

## 2019-09-17 NOTE — Progress Notes (Signed)
ANTICOAGULATION CONSULT NOTE - Follow Up Consult  Pharmacy Consult for heparin Indication: chest pain/ACS and atrial fibrillation  Allergies  Allergen Reactions  . Sulfa Antibiotics Hives  . Biaxin [Clarithromycin] Hives  . Buspar [Buspirone]   . Influenza A (H1n1) Monoval Vac Other (See Comments)    Pt states that she was told by her MD not to get the influenza vaccine.    . Morphine Other (See Comments)    Reaction:  Dizziness and confusion   . Pyridium [Phenazopyridine Hcl] Other (See Comments)    Reaction:  Unknown   . Tuberculin Tests   . Ceftriaxone Anxiety  . Latex Rash  . Prednisone Rash  . Tape Rash    Patient Measurements: Height: 5\' 7"  (170.2 cm) Weight: 271 lb 2.7 oz (123 kg) IBW/kg (Calculated) : 61.6 Heparin Dosing Weight: 91 kg  Vital Signs: Temp: 97.8 F (36.6 C) (11/29 2100) Temp Source: Oral (11/29 2100) BP: 118/78 (11/29 2300) Pulse Rate: 97 (11/29 2300)  Labs: Recent Labs    09/14/19 0823 09/14/19 0824 09/15/19 0615 09/16/19 0424 09/16/19 1337 09/16/19 2340  HGB 9.4*  --  9.3* 9.9*  --   --   HCT 30.4*  --  30.0* 31.6*  --   --   PLT 414*  --  397 398  --   --   HEPARINUNFRC  --   --  0.51 0.83* 0.95* 0.49  CREATININE 6.55* 6.53* 5.08* 5.92*  --   --     Estimated Creatinine Clearance: 11 mL/min (A) (by C-G formula based on SCr of 5.92 mg/dL (H)).   Assessment: 31 yof admitted with atrial fibrillation and NSTEMI from [facility]. No anticoagulation PTA. Her CHADSVASC score is 5. Positive troponins on admission with a peak of 2336. Of note, she was COVID + at her ALF 11/4. Per cardiology, a cardioversion and cardiac cath may be planned in the future.   Drip going in left-AC and lab drawing from hand - so should be distal enough to not effect the level. Will reduce again, phlebotomy held drip for 10 minutes prior to this draw.   Heparin level therapeutic (0.49) on gtt at 1650 units/hr. No bleeding noted.   Goal of Therapy:  Heparin level  0.3-0.7 units/ml Monitor platelets by anticoagulation protocol: Yes   Plan:  - Continue Heparin at 1650 units/hr (16.5 ml/hr) - F/u daily heparin level and CBC  Thank you for allowing pharmacy to be a part of this patient's care.  Sherlon Handing, PharmD, BCPS Please see amion for complete clinical pharmacist phone list 09/17/2019 12:17 AM

## 2019-09-17 NOTE — Progress Notes (Signed)
ANTICOAGULATION CONSULT NOTE - Follow Up Consult  Pharmacy Consult for heparin and Warfarin Indication: chest pain/ACS and atrial fibrillation  Allergies  Allergen Reactions  . Sulfa Antibiotics Hives  . Biaxin [Clarithromycin] Hives  . Buspar [Buspirone]   . Influenza A (H1n1) Monoval Vac Other (See Comments)    Pt states that she was told by her MD not to get the influenza vaccine.    . Morphine Other (See Comments)    Reaction:  Dizziness and confusion   . Pyridium [Phenazopyridine Hcl] Other (See Comments)    Reaction:  Unknown   . Tuberculin Tests   . Ceftriaxone Anxiety  . Latex Rash  . Prednisone Rash  . Tape Rash    Patient Measurements: Height: 5\' 7"  (170.2 cm) Weight: 271 lb 2.7 oz (123 kg) IBW/kg (Calculated) : 61.6 Heparin Dosing Weight: 91 kg  Vital Signs: Temp: 97.9 F (36.6 C) (11/30 0834) Temp Source: Axillary (11/30 0834) BP: 127/93 (11/30 0900) Pulse Rate: 75 (11/30 1000)  Labs: Recent Labs    09/15/19 0615 09/16/19 0424 09/16/19 1337 09/16/19 2340 09/17/19 0540  HGB 9.3* 9.9*  --   --   --   HCT 30.0* 31.6*  --   --   --   PLT 397 398  --   --   --   HEPARINUNFRC 0.51 0.83* 0.95* 0.49 0.33  CREATININE 5.08* 5.92*  --   --  6.94*    Estimated Creatinine Clearance: 9.4 mL/min (A) (by C-G formula based on SCr of 6.94 mg/dL (H)).   Assessment: 70 yof admitted with atrial fibrillation and NSTEMI from [facility] No anticoagulation PTA. Her CHADSVASC score is 5. Positive troponins on admission with a peak of 2336. Of note, she was COVID + at her ALF 11/4. Per cardiology, a cardioversion and cardiac cath may be planned in the future.   Heparin level is therapeutic at 0.33 on 1650 units/hr. Drip is infusing  in left-AC and lab drawing from hand - so should be distal enough to not effect the level. No bleeding noted.  Cardiology consulted pharmacy to dose warfarin today. INR at baseline was 1.1. LFTs are within normal limits. No significant drug  interactions noted.    Goal of Therapy:  Heparin level 0.3-0.7 units/ml Monitor platelets by anticoagulation protocol: Yes   Plan:  - Continue Heparin at 1650 units/hr (16.5 ml/hr) - F/u daily heparin level and CBC - Warfarin 7.5 mg po x1 today at 1800 PM.  - Daily PT/INR.   Thank you for allowing pharmacy to be a part of this patient's care.  Sloan Leiter, PharmD, BCPS, BCCCP Clinical Pharmacist Please refer to [facility] for [place] numbers 09/17/2019 10:12 AM

## 2019-09-17 NOTE — Progress Notes (Signed)
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  She initially presented to ED from dialysis where she was hypotensive and tachycardic.  Dialysis session had to be stopped early.  She was diagnosed with Covid 19 on 11 4 at ALF.  She did not have similar symptoms in the been out of hospital.  She denied chest pain.  At the time of presentation to [facility], she had troponin elevation of 2336, global ST depression, Tuesday 1 atrial flutter on EKG.  She was also in atrial flutter with RVR, cardiology was consulted for non-STEMI and atrial flutter.  She was started on heparin infusion with beta-blockers.  Plan was to pursue cardiac catheterization however she became hypotensive in [place] ED and was subsequently stabilized, blood pressure was stabilized and patient transferred to [facility].    Subjective   Patient seen and examined, denies chest pain or shortness of breath.   Assessment/Plan:     1. Non-STEMI/HFpEF-patient had troponin elevation to 2336 with EKG changes.  Cardiology at [facility] started patient on heparin.  Continue aspirin, statin, beta-blockers.  There was a plan for cardiac catheterization.  Cardiology following, no plans for cardiac cath at this time due to Covid infection.  Continue medical management.  2. Atrial flutter with RVR-patient going in and out of flutter, currently in normal sinus rhythm.  Continue heparin protocol, metoprolol 37.5 mg p.o. every 6 hours.  3. ESRD on hemodialysis MWF-nephrology has been consulted and patient undergoing  hemodialysis in the hospital.   [place]    09/15/19 0615 09/16/19 0424 09/17/19 0540  DDIMER 1.33* 1.37*  --   FERRITIN 390* 269 341*  CRP 3.4* 2.3* 1.3*   COVID-19 infection-patient has been tested positive for COVID-19, she is currently asymptomatic.  Chest x-ray does not show infectious process.  Patient did have elevated CRP, 11.7, ferritin 392 at the time of admission.  Repeat Covid labs show CRP 14.3, ferritin 465.  Started on remdesivir per pharmacy, today is day #5.  Decadron 6 mg p.o. daily.CRP is down to 1.3.  4. Hypertension-blood pressure stable, continue metoprolol.  5. Diabetes mellitus-glucose is still elevated after patient started on Decadron.  Will change Lantus to 45 units subcu daily, NovoLog sliding scale to 8 units subcu 3 times daily.  6. History of seizure disorder-continue Keppra.       CBG: Recent Labs  Lab 09/16/19 2139 09/16/19 2352 09/17/19 0440 09/17/19 0830 09/17/19 1137  GLUCAP 343* 369* 263* 236* 264*    CBC: Recent Labs  Lab 09/11/19 2340 09/13/19 0104 09/14/19 0823 09/15/19 0615 09/16/19 0424  WBC 10.3 8.1 8.6 11.5* 12.3*  NEUTROABS 6.8 4.8 6.5 8.9* 9.7*  HGB 9.6* 10.1* 9.4* 9.3* 9.9*  HCT 31.4* 32.7* 30.4* 30.0* 31.6*  MCV 92.4 93.2 93.5 92.0 92.4  PLT 439* 371 414* 397 163    Basic Metabolic Panel: Recent Labs  Lab 09/11/19 2340  09/13/19 0104 09/14/19 0823 09/14/19 0824 09/15/19 0615 09/16/19 0424 09/17/19 0540  NA 135   < > 137 134* 132* 130* 129* 128*  K 3.9   < > 4.1 4.7 4.6 4.2 5.2* 5.0  CL 98   < > 98 96* 95* 93* 93* 93*  CO2 26   < > 23 26 26 24 22 23   GLUCOSE 195*   < > 195* 285* 282* 353* 427* 281*  BUN 38*   < > 23 34* 34* 27* 44* 62*  CREATININE 6.90*   < > 5.25* 6.55* 6.53* 5.08* 5.92* 6.94*  CALCIUM 9.2   < > 9.0 9.8 9.7 8.8* 9.2 8.9  MG 2.0  --  2.0 2.2  --  1.9 2.0  --   PHOS 3.5   < > 3.1 3.3 3.2 3.0 4.7* 4.5   < > = values in this interval not displayed.    COVID-19  Labs  Recent Labs    09/15/19 0615 09/16/19 0424 09/17/19 0540  DDIMER 1.33* 1.37*  --   FERRITIN 390* 269 341*  CRP 3.4* 2.3* 1.3*    Lab Results  Component Value Date   SARSCOV2NAA POSITIVE (A) 09/10/2019   [place] NEGATIVE 07/04/2019   [place] NEGATIVE 07/02/2019   [place] NEGATIVE 05/29/2019    DVT prophylaxis: Heparin  Code Status: Full code  Family Communication: No family at bedside  Disposition Plan: likely home when medically ready for discharge  Pressure Injury 09/12/19 Sacrum Medial Stage I -  Intact skin with non-blanchable redness of a localized area usually over a bony prominence. (Active)  09/12/19 0010  Location: Sacrum  Location Orientation: Medial  Staging: Stage I -  Intact skin with non-blanchable redness of a localized area usually over a bony prominence.  Wound Description (Comments):   Present on Admission: Yes     BMI  Estimated body mass index is 43.16 kg/m as calculated from the following:   Height as of this encounter: 5\' 7"  (1.702 m).   Weight as of this encounter: 125 kg.  Scheduled medications:  . atorvastatin  40 mg Oral q1800  . calcium acetate  1,334 mg Oral TID AC  . Chlorhexidine Gluconate Cloth  6 each Topical Q0600  . citalopram  10 mg Oral Daily  . dexamethasone  6 mg Oral Daily  . docusate sodium  100 mg Oral BID  . insulin aspart  0-20 Units Subcutaneous Q4H  . insulin aspart  6 Units Subcutaneous TID WC  . insulin glargine  35 Units Subcutaneous QHS  . lamoTRIgine  25 mg Oral Daily  . levETIRAcetam  250 mg Oral BID  . mouth rinse  15 mL Mouth Rinse BID  . metoprolol tartrate  75 mg Oral BID  . mirabegron ER  25 mg Oral Daily  . OLANZapine  5 mg Oral QHS  . pantoprazole  40 mg Oral Daily  . senna  1 tablet Oral BID  . sodium chloride flush  3 mL Intravenous Q12H  . torsemide  40 mg Oral Once per day on Sun Tue Thu Sat  . vitamin C  500 mg Oral Daily  . warfarin  7.5 mg Oral ONCE-1800  . Warfarin -  Pharmacist Dosing Inpatient   Does not apply q1800  . zinc sulfate  220 mg Oral Daily    Consultants:  Cardiology  Nephrology  Procedures:     Antibiotics:   Anti-infectives (From admission, onward)   Start     Dose/Rate Route Frequency Ordered Stop   09/16/19 1115  cefTRIAXone (ROCEPHIN) 2 g in sodium chloride 0.9 % 100 mL IVPB     2 g 200 mL/hr over 30 Minutes Intravenous Every 24 hours 09/16/19 1110  09/14/19 1000  remdesivir 100 mg in sodium chloride 0.9 % 250 mL IVPB     100 mg 500 mL/hr over 30 Minutes Intravenous Every 24 hours 09/13/19 0858 09/17/19 0932   09/13/19 1000  remdesivir 200 mg in sodium chloride 0.9 % 250 mL IVPB     200 mg 500 mL/hr over 30 Minutes Intravenous Once 09/13/19 0858 09/13/19 1126   09/12/19 2217  remdesivir 100 mg in sodium chloride 0.9 % 250 mL IVPB  Status:  Discontinued     100 mg 500 mL/hr over 30 Minutes Intravenous Every 24 hours 09/11/19 2224 09/11/19 2252   09/11/19 2230  remdesivir 200 mg in sodium chloride 0.9 % 250 mL IVPB  Status:  Discontinued     200 mg 500 mL/hr over 30 Minutes Intravenous Once 09/11/19 2224 09/11/19 2252       Objective   Vitals:   09/17/19 1300 09/17/19 1315 09/17/19 1330 09/17/19 1358  BP: 125/61 (!) 133/57 129/68 (!) 130/58  Pulse: 79 83 84 78  Resp:    17  Temp:    98.2 F (36.8 C)  TempSrc:    Oral  SpO2:    96%  Weight:    125 kg  Height:        Intake/Output Summary (Last 24 hours) at 09/17/2019 1434 Last data filed at 09/17/2019 1340 Gross per 24 hour  Intake 981.37 ml  Output 3110 ml  Net -2128.63 ml   Filed Weights   09/12/19 1644 09/17/19 1015 09/17/19 1358  Weight: 123 kg 128.4 kg 125 kg     Physical Examination:  General-appears in no acute distress Heart-S1-S2, regular, no murmur auscultated Lungs-clear to auscultation bilaterally, no wheezing or crackles auscultated Abdomen-soft, nontender, no organomegaly Extremities-no edema in the lower  extremities Neuro-alert, oriented x3, no focal deficit noted  Data Reviewed: I have personally reviewed following labs and imaging studies   Recent Results (from the past 240 hour(s))  SARS CORONAVIRUS 2 (TAT 6-24 HRS) Nasopharyngeal Nasopharyngeal Swab     Status: Abnormal   Collection Time: 09/10/19  1:24 PM   Specimen: Nasopharyngeal Swab  Result Value Ref Range Status   SARS Coronavirus 2 POSITIVE (A) NEGATIVE Final    Comment: RESULT CALLED TO, READ BACK BY AND VERIFIED WITH: M.JOHNSON RN 2022 09/10/2019 MCCORMICK K (NOTE) SARS-CoV-2 target nucleic acids are DETECTED. The SARS-CoV-2 RNA is generally detectable in upper and lower respiratory specimens during the acute phase of infection. Positive results are indicative of active infection with SARS-CoV-2. Clinical  correlation with patient history and other diagnostic information is necessary to determine patient infection status. Positive results do  not rule out bacterial infection or co-infection with other viruses. The expected result is Negative. Fact Sheet for Patients: SugarRoll.be Fact Sheet for Healthcare Providers: https://www.woods-mathews.com/ This test is not yet approved or cleared by the [place] FDA and  has been authorized for detection and/or diagnosis of SARS-CoV-2 by FDA under an Emergency Use Authorization (EUA). This EUA will remain  in effect (meaning this test can be used)  for the duration of the COVID-19 declaration under Section 564(b)(1) of the Act, 21 U.S.C. section 360bbb-3(b)(1), unless the authorization is terminated or revoked sooner. Performed at [facility], [place] [address] [place],  [address]   MRSA PCR Screening     Status: None   Collection Time: 09/11/19  9:01 PM   Specimen: Nasopharyngeal  Result Value Ref Range Status   MRSA by PCR NEGATIVE NEGATIVE Final  Comment:        The GeneXpert MRSA Assay (FDA approved for  NASAL specimens only), is one component of a comprehensive MRSA colonization surveillance program. It is not intended to diagnose MRSA infection nor to guide or monitor treatment for MRSA infections. Performed at [facility], [place] [address] [place], [place] [address]      Liver Function Tests: Recent Labs  Lab 09/11/19 2340  09/13/19 0104 09/14/19 0823 09/14/19 0824 09/15/19 0615 09/16/19 0424 09/17/19 0540  AST 16  --  15 12*  --  17 17  --   ALT 13  --  11 11  --  12 14  --   ALKPHOS 73  --  70 67  --  73 70  --   BILITOT 1.0  --  0.8 0.5  --  0.8 0.6  --   PROT 6.4*  --  6.4* 6.4*  --  6.3* 6.5  --   ALBUMIN 2.8*   < > 2.7* 2.7* 2.7* 2.9* 3.0* 2.7*   < > = values in this interval not displayed.   No results for input(s): LIPASE, AMYLASE in the last 168 hours. No results for input(s): AMMONIA in the last 168 hours.  Cardiac Enzymes: No results for input(s): CKTOTAL, CKMB, CKMBINDEX, TROPONINI in the last 168 hours. BNP (last 3 results) Recent Labs    03/02/19 0530 07/02/19 1734 09/10/19 1329  BNP 867.0* 97.0 869.0*    ProBNP (last 3 results) No results for input(s): PROBNP in the last 8760 hours.    Studies: No results found.   Admission status: Inpatient: Based on patients clinical presentation and evaluation of above clinical data, I have made determination that patient meets Inpatient criteria at this time.   [facility] Pager [phone]. If 7PM-7AM, please contact night-coverage at [email or web address], Office  [phone]  password [place]  09/17/2019, 2:34 PM  LOS: 6 days

## 2019-09-17 NOTE — Progress Notes (Signed)
Heparin infusion was paused for 10 minutes before blood specimen was drawn.

## 2019-09-17 NOTE — Progress Notes (Signed)
Patient ID: IVIS NICOLSON, female   DOB: May 15, 1941, 78 y.o.   MRN: [id number] S: No issues overnight O:BP 129/68   Pulse 84   Temp 97.7 F (36.5 C) (Axillary)   Resp 18   Ht 5\' 7"  (1.702 m)   Wt 128.4 kg   SpO2 97%   BMI 44.34 kg/m   Intake/Output Summary (Last 24 hours) at 09/17/2019 1402 Last data filed at 09/17/2019 1200 Gross per 24 hour  Intake 981.37 ml  Output 110 ml  Net 871.37 ml   Intake/Output: I/O last 3 completed shifts: In: 1353.6 [P.O.:240; I.V.:663.6; IV Piggyback:450] Out: 110 [Urine:110]  Intake/Output this shift:  Total I/O In: 448.9 [P.O.:100; I.V.:98.9; IV Piggyback:250] Out: -  Weight change:  Physical exam: unable to complete due to COVID + status.  In order to preserve PPE equipment and to minimize exposure to providers.  Notes from other caregivers reviewed   Recent Labs  Lab 09/11/19 2340 09/12/19 1010 09/13/19 0104 09/14/19 0823 09/14/19 0824 09/15/19 0615 09/16/19 0424 09/17/19 0540  NA 135 133* 137 134* 132* 130* 129* 128*  K 3.9 3.8 4.1 4.7 4.6 4.2 5.2* 5.0  CL 98 96* 98 96* 95* 93* 93* 93*  CO2 26 26 23 26 26 24 22 23   GLUCOSE 195* 172* 195* 285* 282* 353* 427* 281*  BUN 38* 41* 23 34* 34* 27* 44* 62*  CREATININE 6.90* 6.99* 5.25* 6.55* 6.53* 5.08* 5.92* 6.94*  ALBUMIN 2.8* 2.7* 2.7* 2.7* 2.7* 2.9* 3.0* 2.7*  CALCIUM 9.2 9.1 9.0 9.8 9.7 8.8* 9.2 8.9  PHOS 3.5 4.0 3.1 3.3 3.2 3.0 4.7* 4.5  AST 16  --  15 12*  --  17 17  --   ALT 13  --  11 11  --  12 14  --    Liver Function Tests: Recent Labs  Lab 09/14/19 0823  09/15/19 0615 09/16/19 0424 09/17/19 0540  AST 12*  --  17 17  --   ALT 11  --  12 14  --   ALKPHOS 67  --  73 70  --   BILITOT 0.5  --  0.8 0.6  --   PROT 6.4*  --  6.3* 6.5  --   ALBUMIN 2.7*   < > 2.9* 3.0* 2.7*   < > = values in this interval not displayed.   No results for input(s): LIPASE, AMYLASE in the last 168 hours. No results for input(s): AMMONIA in the last 168 hours. CBC: Recent Labs  Lab  09/11/19 2340 09/13/19 0104 09/14/19 0823 09/15/19 0615 09/16/19 0424  WBC 10.3 8.1 8.6 11.5* 12.3*  NEUTROABS 6.8 4.8 6.5 8.9* 9.7*  HGB 9.6* 10.1* 9.4* 9.3* 9.9*  HCT 31.4* 32.7* 30.4* 30.0* 31.6*  MCV 92.4 93.2 93.5 92.0 92.4  PLT 439* 371 414* 397 398   Cardiac Enzymes: No results for input(s): CKTOTAL, CKMB, CKMBINDEX, TROPONINI in the last 168 hours. CBG: Recent Labs  Lab 09/16/19 2139 09/16/19 2352 09/17/19 0440 09/17/19 0830 09/17/19 1137  GLUCAP 343* 369* 263* 236* 264*    Iron Studies:  Recent Labs    09/17/19 0540  FERRITIN 341*   Studies/Results: No results found. Mar[place] Kitchen atorvastatin  40 mg Oral q1800  . calcium acetate  1,334 mg Oral TID AC  . Chlorhexidine Gluconate Cloth  6 each Topical Q0600  . citalopram  10 mg Oral Daily  . dexamethasone  6 mg Oral Daily  . docusate sodium  100 mg Oral BID  .  insulin aspart  0-20 Units Subcutaneous Q4H  . insulin aspart  6 Units Subcutaneous TID WC  . insulin glargine  35 Units Subcutaneous QHS  . lamoTRIgine  25 mg Oral Daily  . levETIRAcetam  250 mg Oral BID  . mouth rinse  15 mL Mouth Rinse BID  . metoprolol tartrate  75 mg Oral BID  . mirabegron ER  25 mg Oral Daily  . OLANZapine  5 mg Oral QHS  . pantoprazole  40 mg Oral Daily  . senna  1 tablet Oral BID  . sodium chloride flush  3 mL Intravenous Q12H  . torsemide  40 mg Oral Once per day on Sun Tue Thu Sat  . vitamin C  500 mg Oral Daily  . warfarin  7.5 mg Oral ONCE-1800  . Warfarin - Pharmacist Dosing Inpatient   Does not apply q1800  . zinc sulfate  220 mg Oral Daily    BMET    Component Value Date/Time   NA 128 (L) 09/17/2019 0540   NA 138 09/28/2018 1517   NA 137 09/08/2014 1042   K 5.0 09/17/2019 0540   K 5.2 (H) 09/08/2014 1042   CL 93 (L) 09/17/2019 0540   CL 103 09/08/2014 1042   CO2 23 09/17/2019 0540   CO2 28 09/08/2014 1042   GLUCOSE 281 (H) 09/17/2019 0540   GLUCOSE 211 (H) 09/08/2014 1042   BUN 62 (H) 09/17/2019 0540   BUN 33  (H) 09/28/2018 1517   BUN 16 09/08/2014 1042   CREATININE 6.94 (H) 09/17/2019 0540   CREATININE 1.45 (H) 09/08/2014 1042   CALCIUM 8.9 09/17/2019 0540   CALCIUM 8.3 (L) 09/08/2014 1042   GFRNONAA 5 (L) 09/17/2019 0540   GFRNONAA 38 (L) 09/08/2014 1042   GFRNONAA 45 (L) 07/09/2014 1631   GFRAA 6 (L) 09/17/2019 0540   GFRAA 46 (L) 09/08/2014 1042   GFRAA 52 (L) 07/09/2014 1631   CBC    Component Value Date/Time   WBC 12.3 (H) 09/16/2019 0424   RBC 3.42 (L) 09/16/2019 0424   HGB 9.9 (L) 09/16/2019 0424   HGB 8.9 (L) 09/28/2018 1517   HCT 31.6 (L) 09/16/2019 0424   HCT 27.8 (L) 09/28/2018 1517   PLT 398 09/16/2019 0424   PLT 286 09/28/2018 1517   MCV 92.4 09/16/2019 0424   MCV 90 09/28/2018 1517   MCV 80 10/31/2014 1400   MCH 28.9 09/16/2019 0424   MCHC 31.3 09/16/2019 0424   RDW 15.2 09/16/2019 0424   RDW 13.2 09/28/2018 1517   RDW 17.4 (H) 10/31/2014 1400   LYMPHSABS 1.3 09/16/2019 0424   LYMPHSABS 1.4 10/31/2014 1400   MONOABS 1.0 09/16/2019 0424   MONOABS 0.7 10/31/2014 1400   EOSABS 0.0 09/16/2019 0424   EOSABS 0.3 10/31/2014 1400   BASOSABS 0.0 09/16/2019 0424   BASOSABS 0.0 10/31/2014 1400     Assessment/Plan:  1. ESRD- continue with MWF schedule.  Torsemide on non HD days per outpatient regimen 2. covid 19 infection 3. 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 DM- per primary 6. CHF- with preserved EF.  UF with HD as tolerated.  7. Hyponatremia- due to CHF- follow with UF 8. Hyperkalemia- for HD today.    Donetta Potts, MD [employer] [phone]

## 2019-09-17 NOTE — Progress Notes (Signed)
Progress Note  Patient Name: Carolyn Valdez Date of Encounter: 09/17/2019  Primary Cardiologist: Kathlyn Sacramento, MD   Subjective   Per nursing, no CP or dyspnea  Inpatient Medications    Scheduled Meds: . aspirin  81 mg Oral Daily  . atorvastatin  40 mg Oral q1800  . calcium acetate  1,334 mg Oral TID AC  . Chlorhexidine Gluconate Cloth  6 each Topical Q0600  . citalopram  10 mg Oral Daily  . dexamethasone  6 mg Oral Daily  . docusate sodium  100 mg Oral BID  . insulin aspart  0-20 Units Subcutaneous Q4H  . insulin aspart  6 Units Subcutaneous TID WC  . insulin glargine  35 Units Subcutaneous QHS  . lamoTRIgine  25 mg Oral Daily  . levETIRAcetam  250 mg Oral BID  . mouth rinse  15 mL Mouth Rinse BID  . metoprolol tartrate  75 mg Oral BID  . mirabegron ER  25 mg Oral Daily  . OLANZapine  5 mg Oral QHS  . pantoprazole  40 mg Oral Daily  . senna  1 tablet Oral BID  . sodium chloride flush  3 mL Intravenous Q12H  . torsemide  40 mg Oral Once per day on Sun Tue Thu Sat  . vitamin C  500 mg Oral Daily  . zinc sulfate  220 mg Oral Daily   Continuous Infusions: . cefTRIAXone (ROCEPHIN)  IV Stopped (09/16/19 1502)  . heparin 1,650 Units/hr (09/16/19 2226)  . remdesivir 100 mg in NS 250 mL Stopped (09/16/19 1008)   PRN Meds: acetaminophen, albuterol, alum & mag hydroxide-simeth, chlorpheniramine-HYDROcodone, clonazePAM, guaiFENesin-dextromethorphan, ondansetron **OR** ondansetron (ZOFRAN) IV, oxyCODONE, polyethylene glycol   Vital Signs    Vitals:   09/17/19 0457 09/17/19 0500 09/17/19 0600 09/17/19 0700  BP:  122/89 (!) 144/65 135/87  Pulse:  77 66 62  Resp:  16 16 16   Temp: (!) 97.5 F (36.4 C)     TempSrc: Axillary     SpO2:  99% 99% 99%  Weight:      Height:        Intake/Output Summary (Last 24 hours) at 09/17/2019 0815 Last data filed at 09/17/2019 0600 Gross per 24 hour  Intake 899.42 ml  Output 110 ml  Net 789.42 ml   Last 3 Weights 09/12/2019  09/12/2019 09/11/2019  Weight (lbs) 271 lb 2.7 oz 276 lb 10.8 oz 274 lb 4 oz  Weight (kg) 123 kg 125.5 kg 124.4 kg      Telemetry    Atrial flutter, rate  well controlled - Personally Reviewed  Physical Exam   Not examined due to covid positive  Labs    High Sensitivity Troponin:   Recent Labs  Lab 09/10/19 1329 09/10/19 1515  TROPONINIHS 2,336* 2,021*      Chemistry Recent Labs  Lab 09/14/19 0823  09/15/19 0615 09/16/19 0424 09/17/19 0540  NA 134*   < > 130* 129* 128*  K 4.7   < > 4.2 5.2* 5.0  CL 96*   < > 93* 93* 93*  CO2 26   < > 24 22 23   GLUCOSE 285*   < > 353* 427* 281*  BUN 34*   < > 27* 44* 62*  CREATININE 6.55*   < > 5.08* 5.92* 6.94*  CALCIUM 9.8   < > 8.8* 9.2 8.9  PROT 6.4*  --  6.3* 6.5  --   ALBUMIN 2.7*   < > 2.9* 3.0* 2.7*  AST 12*  --  17 17  --   ALT 11  --  12 14  --   ALKPHOS 67  --  73 70  --   BILITOT 0.5  --  0.8 0.6  --   GFRNONAA 6*   < > 8* 6* 5*  GFRAA 7*   < > 9* 7* 6*  ANIONGAP 12   < > 13 14 12    < > = values in this interval not displayed.     Hematology Recent Labs  Lab 09/14/19 0823 09/15/19 0615 09/16/19 0424  WBC 8.6 11.5* 12.3*  RBC 3.25* 3.26* 3.42*  HGB 9.4* 9.3* 9.9*  HCT 30.4* 30.0* 31.6*  MCV 93.5 92.0 92.4  MCH 28.9 28.5 28.9  MCHC 30.9 31.0 31.3  RDW 14.9 15.2 15.2  PLT 414* 397 398    BNP Recent Labs  Lab 09/10/19 1329  BNP 869.0*     DDimer  Recent Labs  Lab 09/14/19 0619 09/15/19 0615 09/16/19 0424  DDIMER 1.55* 1.33* 1.37*     Patient Profile     78 y.o. female with history of chronic HFpEF, pulmonary hypertension, GI bleed (07/2018), hypertension, hyperlipidemia, diabetes mellitus, obesity, and ESRD on hemodialysis who was admitted to [facility] with dyspnea, fatigue and palpitations, not tolerating HD. She was found to have atrial flutter with RVR and mildly elevated troponin. She tested positive for Covid 19.  She was seen [facility] by Dr. Fletcher Anon and Dr. Saunders Revel and placed continued on IV heparin.  She has been rate controlled with metoprolol.   Assessment & Plan    1. Atrial flutter-heart rate is well controlled.  We will continue metoprolol 75 mg twice daily.  Continue IV heparin.  Will  need transition to Coumadin. Will ask pharmacy to initiate. Given need for anticoagulation will discontinue ASA.  Plan will  be to proceed with cardioversion after Covid infection resolves and 4 weeks of anticoagualtion.  This would be done 4 weeks after INR therapeutic. Outpatient follow up with Dr Fletcher Anon. As cardiac condition is stable will sign off now. Please call with further questions or concerns.   2. Elevated troponin-this is felt to be demand ischemia in setting of Covid 19 infection.  Echocardiogram shows normal LV function.  Can consider further ischemia evaluation when she recovers from Covid.  This can be performed as an outpatient.  3. End-stage renal disease-nephrology following.  4. History of acute on chronic diastolic congestive heart failure-volume management per dialysis.  [facility] will sign off.   Medication Recommendations:  Per [facility] Other recommendations (labs, testing, etc):  nonw Follow up as an outpatient:  With Dr Fletcher Anon in [place] in 4 weeks.    For questions or updates, please contact [place] Please consult [email or web address] for contact info under        Signed, Tanji Storrs [place], MD  09/17/2019, 8:15 AM

## 2019-09-18 LAB — MAGNESIUM: Magnesium: 1.9 mg/dL (ref 1.7–2.4)

## 2019-09-18 LAB — CBC
HCT: 31.1 % — ABNORMAL LOW (ref 36.0–46.0)
Hemoglobin: 9.7 g/dL — ABNORMAL LOW (ref 12.0–15.0)
MCH: 28.8 pg (ref 26.0–34.0)
MCHC: 31.2 g/dL (ref 30.0–36.0)
MCV: 92.3 fL (ref 80.0–100.0)
Platelets: 300 10*3/uL (ref 150–400)
RBC: 3.37 MIL/uL — ABNORMAL LOW (ref 3.87–5.11)
RDW: 15.6 % — ABNORMAL HIGH (ref 11.5–15.5)
WBC: 11.2 10*3/uL — ABNORMAL HIGH (ref 4.0–10.5)
nRBC: 0.3 % — ABNORMAL HIGH (ref 0.0–0.2)

## 2019-09-18 LAB — RENAL FUNCTION PANEL
Albumin: 2.8 g/dL — ABNORMAL LOW (ref 3.5–5.0)
Anion gap: 18 — ABNORMAL HIGH (ref 5–15)
BUN: 48 mg/dL — ABNORMAL HIGH (ref 8–23)
CO2: 19 mmol/L — ABNORMAL LOW (ref 22–32)
Calcium: 8.5 mg/dL — ABNORMAL LOW (ref 8.9–10.3)
Chloride: 94 mmol/L — ABNORMAL LOW (ref 98–111)
Creatinine, Ser: 4.92 mg/dL — ABNORMAL HIGH (ref 0.44–1.00)
GFR calc Af Amer: 9 mL/min — ABNORMAL LOW (ref >60)
GFR calc non Af Amer: 8 mL/min — ABNORMAL LOW (ref >60)
Glucose, Bld: 282 mg/dL — ABNORMAL HIGH (ref 70–99)
Phosphorus: 3.8 mg/dL (ref 2.5–4.6)
Potassium: 4.8 mmol/L (ref 3.5–5.1)
Sodium: 131 mmol/L — ABNORMAL LOW (ref 135–145)

## 2019-09-18 LAB — PROTIME-INR
INR: 1.1 (ref 0.8–1.2)
Prothrombin Time: 13.6 seconds (ref 11.4–15.2)

## 2019-09-18 LAB — GLUCOSE, CAPILLARY
Glucose-Capillary: 198 mg/dL — ABNORMAL HIGH (ref 70–99)
Glucose-Capillary: 198 mg/dL — ABNORMAL HIGH (ref 70–99)
Glucose-Capillary: 253 mg/dL — ABNORMAL HIGH (ref 70–99)
Glucose-Capillary: 267 mg/dL — ABNORMAL HIGH (ref 70–99)
Glucose-Capillary: 295 mg/dL — ABNORMAL HIGH (ref 70–99)
Glucose-Capillary: 311 mg/dL — ABNORMAL HIGH (ref 70–99)
Glucose-Capillary: 317 mg/dL — ABNORMAL HIGH (ref 70–99)

## 2019-09-18 LAB — HEPARIN LEVEL (UNFRACTIONATED): Heparin Unfractionated: 0.45 IU/mL (ref 0.30–0.70)

## 2019-09-18 MED ORDER — WARFARIN SODIUM 7.5 MG PO TABS
7.5000 mg | ORAL_TABLET | Freq: Once | ORAL | Status: AC
  Administered 2019-09-18: 18:00:00 7.5 mg via ORAL
  Filled 2019-09-18: qty 1

## 2019-09-18 MED ORDER — MAGIC MOUTHWASH W/LIDOCAINE
10.0000 mL | Freq: Four times a day (QID) | ORAL | Status: DC | PRN
  Administered 2019-09-18: 10 mL via ORAL
  Filled 2019-09-18 (×2): qty 10

## 2019-09-18 NOTE — Progress Notes (Signed)
Patient ID: Carolyn Valdez, female   DOB: 1940/12/01, 78 y.o.   MRN: [id number] S: tolerated HD well yesterday.  O:BP (!) 147/77   Pulse 76   Temp 97.6 F (36.4 C) (Axillary)   Resp 16   Ht 5\' 7"  (1.702 m)   Wt 125 kg   SpO2 99%   BMI 43.16 kg/m   Intake/Output Summary (Last 24 hours) at 09/18/2019 1226 Last data filed at 09/18/2019 1200 Gross per 24 hour  Intake 1056.05 ml  Output 3000 ml  Net -1943.95 ml   Intake/Output: I/O last 3 completed shifts: In: 1794.8 [P.O.:500; I.V.:744.9; IV Piggyback:550] Out: 0938 [Urine:110; Other:3000]  Intake/Output this shift:  Total I/O In: 242.6 [P.O.:160; I.V.:82.6] Out: -  Weight change:  Gen: sitting up in bed in NAD Physical exam: unable to complete due to COVID + status.  In order to preserve PPE equipment and to minimize exposure to providers.  Notes from other caregivers reviewed   Recent Labs  Lab 09/11/19 2340  09/13/19 0104 09/14/19 1829 09/14/19 0824 09/15/19 0615 09/16/19 0424 09/17/19 0540 09/18/19 0322  NA 135   < > 137 134* 132* 130* 129* 128* 131*  K 3.9   < > 4.1 4.7 4.6 4.2 5.2* 5.0 4.8  CL 98   < > 98 96* 95* 93* 93* 93* 94*  CO2 26   < > 23 26 26 24 22 23  19*  GLUCOSE 195*   < > 195* 285* 282* 353* 427* 281* 282*  BUN 38*   < > 23 34* 34* 27* 44* 62* 48*  CREATININE 6.90*   < > 5.25* 6.55* 6.53* 5.08* 5.92* 6.94* 4.92*  ALBUMIN 2.8*   < > 2.7* 2.7* 2.7* 2.9* 3.0* 2.7* 2.8*  CALCIUM 9.2   < > 9.0 9.8 9.7 8.8* 9.2 8.9 8.5*  PHOS 3.5   < > 3.1 3.3 3.2 3.0 4.7* 4.5 3.8  AST 16  --  15 12*  --  17 17  --   --   ALT 13  --  11 11  --  12 14  --   --    < > = values in this interval not displayed.   Liver Function Tests: Recent Labs  Lab 09/14/19 0823  09/15/19 0615 09/16/19 0424 09/17/19 0540 09/18/19 0322  AST 12*  --  17 17  --   --   ALT 11  --  12 14  --   --   ALKPHOS 67  --  73 70  --   --   BILITOT 0.5  --  0.8 0.6  --   --   PROT 6.4*  --  6.3* 6.5  --   --   ALBUMIN 2.7*   < > 2.9* 3.0* 2.7*  2.8*   < > = values in this interval not displayed.   No results for input(s): LIPASE, AMYLASE in the last 168 hours. No results for input(s): AMMONIA in the last 168 hours. CBC: Recent Labs  Lab 09/13/19 0104 09/14/19 0823 09/15/19 0615 09/16/19 0424 09/18/19 0322  WBC 8.1 8.6 11.5* 12.3* 11.2*  NEUTROABS 4.8 6.5 8.9* 9.7*  --   HGB 10.1* 9.4* 9.3* 9.9* 9.7*  HCT 32.7* 30.4* 30.0* 31.6* 31.1*  MCV 93.2 93.5 92.0 92.4 92.3  PLT 371 414* 397 398 300   Cardiac Enzymes: No results for input(s): CKTOTAL, CKMB, CKMBINDEX, TROPONINI in the last 168 hours. CBG: Recent Labs  Lab 09/17/19 1727 09/17/19 2007 09/18/19 0010  09/18/19 0405 09/18/19 0805  GLUCAP 298* 349* 317* 267* 198*    Iron Studies:  Recent Labs    09/17/19 0540  FERRITIN 341*   Studies/Results: No results found. Marland Kitchen atorvastatin  40 mg Oral q1800  . calcium acetate  1,334 mg Oral TID AC  . Chlorhexidine Gluconate Cloth  6 each Topical Q0600  . citalopram  10 mg Oral Daily  . dexamethasone  6 mg Oral Daily  . docusate sodium  100 mg Oral BID  . insulin aspart  0-15 Units Subcutaneous TID AC & HS  . insulin aspart  8 Units Subcutaneous TID WC  . insulin glargine  45 Units Subcutaneous QHS  . lamoTRIgine  25 mg Oral Daily  . levETIRAcetam  250 mg Oral BID  . mouth rinse  15 mL Mouth Rinse BID  . metoprolol tartrate  75 mg Oral BID  . mirabegron ER  25 mg Oral Daily  . OLANZapine  5 mg Oral QHS  . pantoprazole  40 mg Oral Daily  . senna  1 tablet Oral BID  . sodium chloride flush  3 mL Intravenous Q12H  . torsemide  40 mg Oral Once per day on Sun Tue Thu Sat  . vitamin C  500 mg Oral Daily  . warfarin  7.5 mg Oral ONCE-1800  . Warfarin - Pharmacist Dosing Inpatient   Does not apply q1800  . zinc sulfate  220 mg Oral Daily    BMET    Component Value Date/Time   NA 131 (L) 09/18/2019 0322   NA 138 09/28/2018 1517   NA 137 09/08/2014 1042   K 4.8 09/18/2019 0322   K 5.2 (H) 09/08/2014 1042   CL  94 (L) 09/18/2019 0322   CL 103 09/08/2014 1042   CO2 19 (L) 09/18/2019 0322   CO2 28 09/08/2014 1042   GLUCOSE 282 (H) 09/18/2019 0322   GLUCOSE 211 (H) 09/08/2014 1042   BUN 48 (H) 09/18/2019 0322   BUN 33 (H) 09/28/2018 1517   BUN 16 09/08/2014 1042   CREATININE 4.92 (H) 09/18/2019 0322   CREATININE 1.45 (H) 09/08/2014 1042   CALCIUM 8.5 (L) 09/18/2019 0322   CALCIUM 8.3 (L) 09/08/2014 1042   GFRNONAA 8 (L) 09/18/2019 0322   GFRNONAA 38 (L) 09/08/2014 1042   GFRNONAA 45 (L) 07/09/2014 1631   GFRAA 9 (L) 09/18/2019 0322   GFRAA 46 (L) 09/08/2014 1042   GFRAA 52 (L) 07/09/2014 1631   CBC    Component Value Date/Time   WBC 11.2 (H) 09/18/2019 0322   RBC 3.37 (L) 09/18/2019 0322   HGB 9.7 (L) 09/18/2019 0322   HGB 8.9 (L) 09/28/2018 1517   HCT 31.1 (L) 09/18/2019 0322   HCT 27.8 (L) 09/28/2018 1517   PLT 300 09/18/2019 0322   PLT 286 09/28/2018 1517   MCV 92.3 09/18/2019 0322   MCV 90 09/28/2018 1517   MCV 80 10/31/2014 1400   MCH 28.8 09/18/2019 0322   MCHC 31.2 09/18/2019 0322   RDW 15.6 (H) 09/18/2019 0322   RDW 13.2 09/28/2018 1517   RDW 17.4 (H) 10/31/2014 1400   LYMPHSABS 1.3 09/16/2019 0424   LYMPHSABS 1.4 10/31/2014 1400   MONOABS 1.0 09/16/2019 0424   MONOABS 0.7 10/31/2014 1400   EOSABS 0.0 09/16/2019 0424   EOSABS 0.3 10/31/2014 1400   BASOSABS 0.0 09/16/2019 0424   BASOSABS 0.0 10/31/2014 1400   Dialysis Prescription:  [facility], MWF, edw 124.5 kg, 2K/2.5Ca, BFR 400, DFR 800 Heparin  1700 bolus, then 1000 units/hr, epo 1400 IVP tiw, venofer 100mg  IV qweek  Assessment/Plan:  1. ESRD- continue with MWF schedule.  Torsemide on non HD days per outpatient regimen 2. covid 19 infection c/b NSTEMI cont medical management.  Currently asymptomatic. Treated with remdesivir and decadron. 3. A flutter/afib with RVR- per cardiology doing well with metoprolol 75 mg bid, on IV heparin, coumadin per pharmacy 4. 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 DM- per  primary 6. CHF- with preserved EF.  UF with HD as tolerated.  7. Hyponatremia- due to CHF- follow with UF 8. Hyperkalemia- improved with HD.   9. H/o seizure disorder- on [place], MD [facility] [phone]

## 2019-09-18 NOTE — Progress Notes (Signed)
ANTICOAGULATION CONSULT NOTE - Follow Up Consult  Pharmacy Consult for heparin and Warfarin Indication: chest pain/ACS and atrial fibrillation  Allergies  Allergen Reactions  . Sulfa Antibiotics Hives  . Biaxin [Clarithromycin] Hives  . Buspar [Buspirone]   . Influenza A (H1n1) Monoval Vac Other (See Comments)    Pt states that she was told by her MD not to get the influenza vaccine.    . Morphine Other (See Comments)    Reaction:  Dizziness and confusion   . Pyridium [Phenazopyridine Hcl] Other (See Comments)    Reaction:  Unknown   . Tuberculin Tests   . Ceftriaxone Anxiety  . Latex Rash  . Prednisone Rash  . Tape Rash    Patient Measurements: Height: 5\' 7"  (170.2 cm) Weight: 275 lb 9.2 oz (125 kg) IBW/kg (Calculated) : 61.6 Heparin Dosing Weight: 91 kg  Vital Signs: Temp: 97.8 F (36.6 C) (12/01 0813) Temp Source: Oral (12/01 0813) BP: 153/66 (12/01 0800) Pulse Rate: 67 (12/01 0800)  Labs: Recent Labs    09/16/19 0424  09/16/19 2340 09/17/19 0540 09/18/19 0322  HGB 9.9*  --   --   --  9.7*  HCT 31.6*  --   --   --  31.1*  PLT 398  --   --   --  300  LABPROT  --   --   --   --  13.6  INR  --   --   --   --  1.1  HEPARINUNFRC 0.83*   < > 0.49 0.33 0.45  CREATININE 5.92*  --   --  6.94* 4.92*   < > = values in this interval not displayed.    Estimated Creatinine Clearance: 13.4 mL/min (A) (by C-G formula based on SCr of 4.92 mg/dL (H)).   Assessment: 29 yof admitted with atrial fibrillation and NSTEMI from [facility]. No anticoagulation PTA. Her CHADSVASC score is 5. Positive troponins on admission with a peak of 2336. Of note, she was COVID + at her ALF 11/4. Per cardiology, a cardioversion and cardiac cath may be planned in the future.   Heparin level is therapeutic at 0.45 on 1650 units/hr. Drip is infusing  in left-AC and lab drawing from hand - so should be distal enough to not effect the level. No bleeding or issues with infusion noted.  INR 1.1 - no  change after 1 dose of Warfarin. LFTs are within normal limits. No significant drug interactions noted.    Goal of Therapy:  Heparin level 0.3-0.7 units/ml Monitor platelets by anticoagulation protocol: Yes   Plan:  - Continue Heparin at 1650 units/hr (16.5 ml/hr) - F/u daily heparin level and CBC - Warfarin 7.5 mg po x1 again tonight at 1800 PM.  - Daily PT/INR.   Thank you for allowing pharmacy to be a part of this patient's care.  Sloan Leiter, PharmD, BCPS, BCCCP Clinical Pharmacist Please refer to [facility] for [place] numbers 09/18/2019 9:07 AM

## 2019-09-18 NOTE — Progress Notes (Signed)
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  She initially presented to ED from dialysis where she was hypotensive and tachycardic.  Dialysis session had to be stopped early.  She was diagnosed with Covid 19 on 11 4 at ALF.  She did not have similar symptoms in the been out of hospital.  She denied chest pain.  At the time of presentation to [facility], she had troponin elevation of 2336, global ST depression, Tuesday 1 atrial flutter on EKG.  She was also in atrial flutter with RVR, cardiology was consulted for non-STEMI and atrial flutter.  She was started on heparin infusion with beta-blockers.  Plan was to pursue cardiac catheterization however she became hypotensive in [place] ED and was subsequently stabilized, blood pressure was stabilized and patient transferred to [facility].    Subjective   Patient seen and examined, denies chest pain or shortness of breath.   Assessment/Plan:     1. Non-STEMI/HFpEF-patient had troponin elevation to 2336 with EKG changes.  Cardiology at [facility] started patient on heparin.  Continue aspirin, statin, beta-blockers.  There was a plan for cardiac catheterization.  Cardiology following, no plans for cardiac cath at this time due to Covid infection.  Continue medical management.  2. Atrial flutter with RVR-patient going in and out of flutter, currently in normal sinus rhythm.  Continue heparin protocol, metoprolol 37.5 mg p.o. every 6 hours.  3. ESRD on hemodialysis MWF-nephrology has been consulted and patient undergoing  hemodialysis in the hospital.   [place]    09/16/19 0424 09/17/19 0540  DDIMER 1.37*  --   FERRITIN 269 341*  CRP 2.3* 1.3*   COVID-19 infection-patient has been tested positive for COVID-19, she is currently asymptomatic.  Chest x-ray does not show infectious process.  Patient did have elevated CRP, 11.7, ferritin 392 at the time of admission.  Repeat Covid labs show CRP 14.3, ferritin 465.  Started on remdesivir per pharmacy, today is day #5.  Decadron 6 mg p.o. daily.CRP is down to 1.3.  Patient is only requiring 1 L/min of oxygen.  O2 sats 99%.  4. Hypertension-blood pressure stable, continue metoprolol.  5. Left lower extremity cellulitis-improved after starting on ceftriaxone.  Continue ceftriaxone for total 5 days of treatment.  6. Diabetes mellitus-glucose is still elevated after patient started on Decadron.  Lantus was changed to 45 units subcu daily.  NovoLog sliding scale to 8 units subcu 3 times daily.  7. History of seizure disorder-continue Keppra.     CBG: Recent Labs  Lab 09/17/19 2007 09/18/19 0010 09/18/19 0405 09/18/19 0805 09/18/19 1214  GLUCAP 349* 317* 267* 198* 198*    CBC: Recent Labs  Lab 09/11/19 2340 09/13/19 0104 09/14/19 0823 09/15/19 0615 09/16/19 0424 09/18/19 0322  WBC 10.3 8.1 8.6 11.5* 12.3* 11.2*  NEUTROABS 6.8 4.8 6.5 8.9* 9.7*  --   HGB 9.6* 10.1* 9.4* 9.3* 9.9* 9.7*  HCT 31.4* 32.7* 30.4* 30.0* 31.6* 31.1*  MCV 92.4 93.2 93.5 92.0 92.4 92.3  PLT 439* 371 414* 397 398 614    Basic Metabolic Panel: Recent Labs  Lab 09/13/19 0104 09/14/19 0823 09/14/19  8786 09/15/19 0615 09/16/19 0424 09/17/19 0540 09/18/19 0322  NA 137 134* 132* 130* 129* 128* 131*  K 4.1 4.7 4.6 4.2 5.2* 5.0 4.8  CL 98 96* 95* 93* 93* 93* 94*  CO2 23 26 26 24 22 23  19*  GLUCOSE 195* 285* 282* 353* 427* 281* 282*  BUN 23 34* 34* 27* 44* 62* 48*  CREATININE 5.25* 6.55* 6.53* 5.08* 5.92* 6.94* 4.92*  CALCIUM 9.0 9.8 9.7 8.8*  9.2 8.9 8.5*  MG 2.0 2.2  --  1.9 2.0  --  1.9  PHOS 3.1 3.3 3.2 3.0 4.7* 4.5 3.8    COVID-19 Labs  Recent Labs    09/16/19 0424 09/17/19 0540  DDIMER 1.37*  --   FERRITIN 269 341*  CRP 2.3* 1.3*    Lab Results  Component Value Date   SARSCOV2NAA POSITIVE (A) 09/10/2019   SARSCOV2NAA NEGATIVE 07/04/2019   SARSCOV2NAA NEGATIVE 07/02/2019   [place] NEGATIVE 05/29/2019    DVT prophylaxis: Heparin  Code Status: Full code  Family Communication: No family at bedside  Disposition Plan: likely skilled nursing facility.  Pressure Injury 09/12/19 Sacrum Medial Stage I -  Intact skin with non-blanchable redness of a localized area usually over a bony prominence. (Active)  09/12/19 0010  Location: Sacrum  Location Orientation: Medial  Staging: Stage I -  Intact skin with non-blanchable redness of a localized area usually over a bony prominence.  Wound Description (Comments):   Present on Admission: Yes     BMI  Estimated body mass index is 43.16 kg/m as calculated from the following:   Height as of this encounter: 5\' 7"  (1.702 m).   Weight as of this encounter: 125 kg.  Scheduled medications:  . atorvastatin  40 mg Oral q1800  . calcium acetate  1,334 mg Oral TID AC  . Chlorhexidine Gluconate Cloth  6 each Topical Q0600  . citalopram  10 mg Oral Daily  . dexamethasone  6 mg Oral Daily  . docusate sodium  100 mg Oral BID  . insulin aspart  0-15 Units Subcutaneous TID AC & HS  . insulin aspart  8 Units Subcutaneous TID WC  . insulin glargine  45 Units Subcutaneous QHS  . lamoTRIgine  25 mg Oral Daily  . levETIRAcetam  250 mg Oral BID  . mouth rinse  15 mL Mouth Rinse BID  . metoprolol tartrate  75 mg Oral BID  . mirabegron ER  25 mg Oral Daily  . OLANZapine  5 mg Oral QHS  . pantoprazole  40 mg Oral Daily  . senna  1 tablet Oral BID  . sodium chloride flush  3 mL Intravenous Q12H  . torsemide  40 mg Oral Once per day on Sun Tue Thu Sat  . vitamin C  500 mg  Oral Daily  . warfarin  7.5 mg Oral ONCE-1800  . Warfarin - Pharmacist Dosing Inpatient   Does not apply q1800  . zinc sulfate  220 mg Oral Daily    Consultants:  Cardiology  Nephrology  Procedures:     Antibiotics:   Anti-infectives (From admission, onward)   Start     Dose/Rate Route Frequency Ordered Stop   09/16/19 1115  cefTRIAXone (ROCEPHIN) 2 g in sodium chloride 0.9 % 100 mL IVPB     2 g 200 mL/hr over 30 Minutes Intravenous Every 24 hours 09/16/19 1110     09/14/19 1000  remdesivir 100 mg in sodium chloride 0.9 % 250 mL IVPB  100 mg 500 mL/hr over 30 Minutes Intravenous Every 24 hours 09/13/19 0858 09/17/19 0932   09/13/19 1000  remdesivir 200 mg in sodium chloride 0.9 % 250 mL IVPB     200 mg 500 mL/hr over 30 Minutes Intravenous Once 09/13/19 0858 09/13/19 1126   09/12/19 2217  remdesivir 100 mg in sodium chloride 0.9 % 250 mL IVPB  Status:  Discontinued     100 mg 500 mL/hr over 30 Minutes Intravenous Every 24 hours 09/11/19 2224 09/11/19 2252   09/11/19 2230  remdesivir 200 mg in sodium chloride 0.9 % 250 mL IVPB  Status:  Discontinued     200 mg 500 mL/hr over 30 Minutes Intravenous Once 09/11/19 2224 09/11/19 2252       Objective   Vitals:   09/18/19 1100 09/18/19 1200 09/18/19 1223 09/18/19 1300  BP:  (!) 147/77    Pulse: 72 76  (!) 108  Resp: 15 16  19   Temp:   97.6 F (36.4 C)   TempSrc:   Axillary   SpO2: 98% 99%  97%  Weight:      Height:        Intake/Output Summary (Last 24 hours) at 09/18/2019 1355 Last data filed at 09/18/2019 1300 Gross per 24 hour  Intake 1056.05 ml  Output 0 ml  Net 1056.05 ml   Filed Weights   09/12/19 1644 09/17/19 1015 09/17/19 1358  Weight: 123 kg 128.4 kg 125 kg     Physical Examination:  General-appears in no acute distress Heart-S1-S2, regular, no murmur auscultated Lungs-clear to auscultation bilaterally, no wheezing or crackles auscultated Abdomen-soft, nontender, no  organomegaly Extremities-no edema in the lower extremities, no erythema noted in the left lower extremity Neuro-alert, oriented x3, no focal deficit noted  Data Reviewed: I have personally reviewed following labs and imaging studies   Recent Results (from the past 240 hour(s))  SARS CORONAVIRUS 2 (TAT 6-24 HRS) Nasopharyngeal Nasopharyngeal Swab     Status: Abnormal   Collection Time: 09/10/19  1:24 PM   Specimen: Nasopharyngeal Swab  Result Value Ref Range Status   SARS Coronavirus 2 POSITIVE (A) NEGATIVE Final    Comment: RESULT CALLED TO, READ BACK BY AND VERIFIED WITH: M.JOHNSON RN 2022 09/10/2019 MCCORMICK K (NOTE) SARS-CoV-2 target nucleic acids are DETECTED. The SARS-CoV-2 RNA is generally detectable in upper and lower respiratory specimens during the acute phase of infection. Positive results are indicative of active infection with SARS-CoV-2. Clinical  correlation with patient history and other diagnostic information is necessary to determine patient infection status. Positive results do  not rule out bacterial infection or co-infection with other viruses. The expected result is Negative. Fact Sheet for Patients: SugarRoll.be Fact Sheet for Healthcare Providers: https://www.woods-mathews.com/ This test is not yet approved or cleared by the [place] FDA and  has been authorized for detection and/or diagnosis of SARS-CoV-2 by FDA under an Emergency Use Authorization (EUA). This EUA will remain  in effect (meaning this test can be used)  for the duration of the COVID-19 declaration under Section 564(b)(1) of the Act, 21 U.S.C. section 360bbb-3(b)(1), unless the authorization is terminated or revoked sooner. Performed at [facility], [place] [address] [place], [place] [address]   MRSA PCR Screening     Status: None   Collection Time: 09/11/19  9:01 PM   Specimen: Nasopharyngeal  Result Value Ref Range Status   MRSA by  PCR NEGATIVE NEGATIVE Final    Comment:  The GeneXpert MRSA Assay (FDA approved for NASAL specimens only), is one component of a comprehensive MRSA colonization surveillance program. It is not intended to diagnose MRSA infection nor to guide or monitor treatment for MRSA infections. Performed at [facility], [place] [address] [place], [place] [address]      Liver Function Tests: Recent Labs  Lab 09/11/19 2340  09/13/19 0104 09/14/19 2426 09/14/19 0824 09/15/19 0615 09/16/19 0424 09/17/19 0540 09/18/19 0322  AST 16  --  15 12*  --  17 17  --   --   ALT 13  --  11 11  --  12 14  --   --   ALKPHOS 73  --  70 67  --  73 70  --   --   BILITOT 1.0  --  0.8 0.5  --  0.8 0.6  --   --   PROT 6.4*  --  6.4* 6.4*  --  6.3* 6.5  --   --   ALBUMIN 2.8*   < > 2.7* 2.7* 2.7* 2.9* 3.0* 2.7* 2.8*   < > = values in this interval not displayed.   No results for input(s): LIPASE, AMYLASE in the last 168 hours. No results for input(s): AMMONIA in the last 168 hours.  Cardiac Enzymes: No results for input(s): CKTOTAL, CKMB, CKMBINDEX, TROPONINI in the last 168 hours. BNP (last 3 results) Recent Labs    03/02/19 0530 07/02/19 1734 09/10/19 1329  BNP 867.0* 97.0 869.0*    Admission status: Inpatient: Based on patients clinical presentation and evaluation of above clinical data, I have made determination that patient meets Inpatient criteria at this time.   [facility] Pager [phone]. If 7PM-7AM, please contact night-coverage at [email or web address], Office  [phone]  password [email or web address]  09/18/2019, 1:55 PM  LOS: 7 days

## 2019-09-19 LAB — GLUCOSE, CAPILLARY
Glucose-Capillary: 282 mg/dL — ABNORMAL HIGH (ref 70–99)
Glucose-Capillary: 217 mg/dL — ABNORMAL HIGH (ref 70–99)
Glucose-Capillary: 251 mg/dL — ABNORMAL HIGH (ref 70–99)
Glucose-Capillary: 91 mg/dL (ref 70–99)

## 2019-09-19 LAB — RENAL FUNCTION PANEL
Albumin: 2.7 g/dL — ABNORMAL LOW (ref 3.5–5.0)
Anion gap: 14 (ref 5–15)
BUN: 68 mg/dL — ABNORMAL HIGH (ref 8–23)
CO2: 21 mmol/L — ABNORMAL LOW (ref 22–32)
Calcium: 9.2 mg/dL (ref 8.9–10.3)
Chloride: 92 mmol/L — ABNORMAL LOW (ref 98–111)
Creatinine, Ser: 5.52 mg/dL — ABNORMAL HIGH (ref 0.44–1.00)
GFR calc Af Amer: 8 mL/min — ABNORMAL LOW (ref >60)
GFR calc non Af Amer: 7 mL/min — ABNORMAL LOW (ref >60)
Glucose, Bld: 294 mg/dL — ABNORMAL HIGH (ref 70–99)
Phosphorus: 4.5 mg/dL (ref 2.5–4.6)
Potassium: 4.9 mmol/L (ref 3.5–5.1)
Sodium: 127 mmol/L — ABNORMAL LOW (ref 135–145)

## 2019-09-19 LAB — HEPARIN LEVEL (UNFRACTIONATED): Heparin Unfractionated: 0.61 IU/mL (ref 0.30–0.70)

## 2019-09-19 LAB — PROTIME-INR
INR: 1.2 (ref 0.8–1.2)
Prothrombin Time: 14.6 seconds (ref 11.4–15.2)

## 2019-09-19 MED ORDER — BISACODYL 5 MG PO TBEC
5.0000 mg | DELAYED_RELEASE_TABLET | Freq: Every day | ORAL | Status: DC | PRN
  Administered 2019-09-20: 5 mg via ORAL
  Filled 2019-09-19 (×2): qty 1

## 2019-09-19 MED ORDER — HEPARIN SODIUM (PORCINE) 1000 UNIT/ML IJ SOLN
INTRAMUSCULAR | Status: AC
  Administered 2019-09-19: 20:00:00 2600 [IU] via INTRAVENOUS
  Filled 2019-09-19: qty 4

## 2019-09-19 MED ORDER — INSULIN GLARGINE 100 UNIT/ML ~~LOC~~ SOLN
50.0000 [IU] | Freq: Every day | SUBCUTANEOUS | Status: DC
  Administered 2019-09-20: 21:00:00 50 [IU] via SUBCUTANEOUS
  Filled 2019-09-19 (×3): qty 0.5

## 2019-09-19 MED ORDER — WARFARIN SODIUM 7.5 MG PO TABS
7.5000 mg | ORAL_TABLET | Freq: Once | ORAL | Status: AC
  Administered 2019-09-19: 7.5 mg via ORAL
  Filled 2019-09-19: qty 1

## 2019-09-19 MED ORDER — HEPARIN SODIUM (PORCINE) 1000 UNIT/ML IJ SOLN
1000.0000 [IU] | INTRAMUSCULAR | Status: DC | PRN
  Administered 2019-09-19: 20:00:00 2600 [IU] via INTRAVENOUS

## 2019-09-19 MED ORDER — INSULIN ASPART 100 UNIT/ML ~~LOC~~ SOLN
10.0000 [IU] | Freq: Three times a day (TID) | SUBCUTANEOUS | Status: DC
  Administered 2019-09-20 – 2019-10-02 (×19): 10 [IU] via SUBCUTANEOUS

## 2019-09-19 NOTE — Progress Notes (Signed)
ANTICOAGULATION CONSULT NOTE - Follow Up Consult  Pharmacy Consult for heparin and Warfarin Indication: chest pain/ACS and atrial fibrillation  Allergies  Allergen Reactions  . Sulfa Antibiotics Hives  . Biaxin [Clarithromycin] Hives  . Buspar [Buspirone]   . Influenza A (H1n1) Monoval Vac Other (See Comments)    Pt states that she was told by her MD not to get the influenza vaccine.    . Morphine Other (See Comments)    Reaction:  Dizziness and confusion   . Pyridium [Phenazopyridine Hcl] Other (See Comments)    Reaction:  Unknown   . Tuberculin Tests   . Ceftriaxone Anxiety  . Latex Rash  . Prednisone Rash  . Tape Rash    Patient Measurements: Height: 5\' 7"  (170.2 cm) Weight: 275 lb 9.2 oz (125 kg) IBW/kg (Calculated) : 61.6 Heparin Dosing Weight: 91 kg  Vital Signs: Temp: 94 F (34.4 C) (12/02 0800) Temp Source: Axillary (12/02 0800) BP: 133/58 (12/02 0800) Pulse Rate: 102 (12/02 0800)  Labs: Recent Labs    09/17/19 0540 09/18/19 0322 09/19/19 0422  HGB  --  9.7*  --   HCT  --  31.1*  --   PLT  --  300  --   LABPROT  --  13.6 14.6  INR  --  1.1 1.2  HEPARINUNFRC 0.33 0.45 0.61  CREATININE 6.94* 4.92* 5.52*    Estimated Creatinine Clearance: 11.9 mL/min (A) (by C-G formula based on SCr of 5.52 mg/dL (H)).   Assessment: 61 yof admitted with atrial fibrillation and NSTEMI from [facility]. No anticoagulation PTA, CHADSVASC 5. Positive troponins on admission with a peak of 2336. Of note, she was COVID + at her ALF 11/4. Per cardiology, a cardioversion and cardiac cath may be planned in the future. Noted hx GIB in 07/2018.  Heparin level remains therapeutic but level increased at 0.61. Drip is infusing in left-AC and lab drawing from hand - so should be distal enough to not effect the level. No bleeding or issues with infusion noted.  INR 1.2 stable after 2 doses of warfarin. LFTs wnl. CBC stable - none drawn today. No significant drug interactions noted.    Goal  of Therapy:  Heparin level 0.3-0.7 units/ml  INR 2-3 Monitor platelets by anticoagulation protocol: Yes   Plan:  Continue Heparin at 1650 units/hr - may need to reduce if continues trend up tomorrow Warfarin 7.5mg  PO x 1 tonight - increase dose tomorrow if INR doesn't start to trend up Monitor daily heparin level/INR/CBC, s/sx bleeding   Elicia Lamp, PharmD, BCPS Please check AMION for all [place] contact numbers Clinical Pharmacist 09/19/2019 10:14 AM

## 2019-09-19 NOTE — Progress Notes (Signed)
Patient ID: Carolyn Valdez, female   DOB: 1941-10-04, 78 y.o.   MRN: [id number] S: No events overnight O:BP (!) 133/58 (BP Location: Left Arm)   Pulse (!) 119   Temp 98.1 F (36.7 C) (Axillary)   Resp 17   Ht 5\' 7"  (1.702 m)   Wt 125 kg   SpO2 96%   BMI 43.16 kg/m   Intake/Output Summary (Last 24 hours) at 09/19/2019 1243 Last data filed at 09/19/2019 1000 Gross per 24 hour  Intake 523.03 ml  Output 200 ml  Net 323.03 ml   Intake/Output: I/O last 3 completed shifts: In: 1297.5 [P.O.:720; I.V.:577.5] Out: 200 [Urine:200]  Intake/Output this shift:  Total I/O In: 66 [I.V.:66] Out: -  Weight change:  ZHG:DJMEQAS in bed in NAD Physical exam: unable to complete due to COVID + status.  In order to preserve PPE equipment and to minimize exposure to providers.  Notes from other caregivers reviewed   Recent Labs  Lab 09/13/19 0104 09/14/19 0823 09/14/19 0824 09/15/19 0615 09/16/19 0424 09/17/19 0540 09/18/19 0322 09/19/19 0422  NA 137 134* 132* 130* 129* 128* 131* 127*  K 4.1 4.7 4.6 4.2 5.2* 5.0 4.8 4.9  CL 98 96* 95* 93* 93* 93* 94* 92*  CO2 23 26 26 24 22 23  19* 21*  GLUCOSE 195* 285* 282* 353* 427* 281* 282* 294*  BUN 23 34* 34* 27* 44* 62* 48* 68*  CREATININE 5.25* 6.55* 6.53* 5.08* 5.92* 6.94* 4.92* 5.52*  ALBUMIN 2.7* 2.7* 2.7* 2.9* 3.0* 2.7* 2.8* 2.7*  CALCIUM 9.0 9.8 9.7 8.8* 9.2 8.9 8.5* 9.2  PHOS 3.1 3.3 3.2 3.0 4.7* 4.5 3.8 4.5  AST 15 12*  --  17 17  --   --   --   ALT 11 11  --  12 14  --   --   --    Liver Function Tests: Recent Labs  Lab 09/14/19 0823  09/15/19 0615 09/16/19 0424 09/17/19 0540 09/18/19 0322 09/19/19 0422  AST 12*  --  17 17  --   --   --   ALT 11  --  12 14  --   --   --   ALKPHOS 67  --  73 70  --   --   --   BILITOT 0.5  --  0.8 0.6  --   --   --   PROT 6.4*  --  6.3* 6.5  --   --   --   ALBUMIN 2.7*   < > 2.9* 3.0* 2.7* 2.8* 2.7*   < > = values in this interval not displayed.   No results for input(s): LIPASE, AMYLASE in the  last 168 hours. No results for input(s): AMMONIA in the last 168 hours. CBC: Recent Labs  Lab 09/13/19 0104 09/14/19 0823 09/15/19 0615 09/16/19 0424 09/18/19 0322  WBC 8.1 8.6 11.5* 12.3* 11.2*  NEUTROABS 4.8 6.5 8.9* 9.7*  --   HGB 10.1* 9.4* 9.3* 9.9* 9.7*  HCT 32.7* 30.4* 30.0* 31.6* 31.1*  MCV 93.2 93.5 92.0 92.4 92.3  PLT 371 414* 397 398 300   Cardiac Enzymes: No results for input(s): CKTOTAL, CKMB, CKMBINDEX, TROPONINI in the last 168 hours. CBG: Recent Labs  Lab 09/18/19 1938 09/18/19 2312 09/19/19 0359 09/19/19 0757 09/19/19 1230  GLUCAP 311* 295* 282* 217* 251*    Iron Studies:  Recent Labs    09/17/19 0540  FERRITIN 341*   Studies/Results: No results found. Marland Kitchen atorvastatin  40 mg Oral  q1800  . calcium acetate  1,334 mg Oral TID AC  . Chlorhexidine Gluconate Cloth  6 each Topical Q0600  . citalopram  10 mg Oral Daily  . dexamethasone  6 mg Oral Daily  . docusate sodium  100 mg Oral BID  . insulin aspart  0-15 Units Subcutaneous TID AC & HS  . insulin aspart  8 Units Subcutaneous TID WC  . insulin glargine  45 Units Subcutaneous QHS  . lamoTRIgine  25 mg Oral Daily  . levETIRAcetam  250 mg Oral BID  . mouth rinse  15 mL Mouth Rinse BID  . metoprolol tartrate  75 mg Oral BID  . mirabegron ER  25 mg Oral Daily  . OLANZapine  5 mg Oral QHS  . pantoprazole  40 mg Oral Daily  . senna  1 tablet Oral BID  . sodium chloride flush  3 mL Intravenous Q12H  . torsemide  40 mg Oral Once per day on Sun Tue Thu Sat  . vitamin C  500 mg Oral Daily  . warfarin  7.5 mg Oral ONCE-1800  . Warfarin - Pharmacist Dosing Inpatient   Does not apply q1800  . zinc sulfate  220 mg Oral Daily    BMET    Component Value Date/Time   NA 127 (L) 09/19/2019 0422   NA 138 09/28/2018 1517   NA 137 09/08/2014 1042   K 4.9 09/19/2019 0422   K 5.2 (H) 09/08/2014 1042   CL 92 (L) 09/19/2019 0422   CL 103 09/08/2014 1042   CO2 21 (L) 09/19/2019 0422   CO2 28 09/08/2014 1042    GLUCOSE 294 (H) 09/19/2019 0422   GLUCOSE 211 (H) 09/08/2014 1042   BUN 68 (H) 09/19/2019 0422   BUN 33 (H) 09/28/2018 1517   BUN 16 09/08/2014 1042   CREATININE 5.52 (H) 09/19/2019 0422   CREATININE 1.45 (H) 09/08/2014 1042   CALCIUM 9.2 09/19/2019 0422   CALCIUM 8.3 (L) 09/08/2014 1042   GFRNONAA 7 (L) 09/19/2019 0422   GFRNONAA 38 (L) 09/08/2014 1042   GFRNONAA 45 (L) 07/09/2014 1631   GFRAA 8 (L) 09/19/2019 0422   GFRAA 46 (L) 09/08/2014 1042   GFRAA 52 (L) 07/09/2014 1631   CBC    Component Value Date/Time   WBC 11.2 (H) 09/18/2019 0322   RBC 3.37 (L) 09/18/2019 0322   HGB 9.7 (L) 09/18/2019 0322   HGB 8.9 (L) 09/28/2018 1517   HCT 31.1 (L) 09/18/2019 0322   HCT 27.8 (L) 09/28/2018 1517   PLT 300 09/18/2019 0322   PLT 286 09/28/2018 1517   MCV 92.3 09/18/2019 0322   MCV 90 09/28/2018 1517   MCV 80 10/31/2014 1400   MCH 28.8 09/18/2019 0322   MCHC 31.2 09/18/2019 0322   RDW 15.6 (H) 09/18/2019 0322   RDW 13.2 09/28/2018 1517   RDW 17.4 (H) 10/31/2014 1400   LYMPHSABS 1.3 09/16/2019 0424   LYMPHSABS 1.4 10/31/2014 1400   MONOABS 1.0 09/16/2019 0424   MONOABS 0.7 10/31/2014 1400   EOSABS 0.0 09/16/2019 0424   EOSABS 0.3 10/31/2014 1400   BASOSABS 0.0 09/16/2019 0424   BASOSABS 0.0 10/31/2014 1400     Dialysis Prescription:  [place], MWF, edw 124.5 kg, 2K/2.5Ca, BFR 400, DFR 800 Heparin 1700 bolus, then 1000 units/hr, epo 1400 IVP tiw, venofer 100mg  IV qweek  Assessment/Plan:  1. ESRD- continue with MWF schedule. Torsemide on non HD days per outpatient regimen 2. covid 19 infection c/b NSTEMI cont medical  management.  Currently asymptomatic. Treated with remdesivir and decadron. 3. A flutter/afib with RVR- per cardiology doing well with metoprolol 75 mg bid, on IV heparin, coumadin per pharmacy 4. 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 LLE cellulitis- improving with ceftriaxone  6. DM- per primary 7. CHF- with preserved EF. UF with HD as  tolerated.  8. Hyponatremia- due to CHF- follow with UF 9. Hyperkalemia- improved with HD. 10. H/o seizure disorder- on keppra 11. Disposition- hopefully nearing discharge after treatment for covid completed and pt remains symptom free.  Donetta Potts, MD [employer] [phone]

## 2019-09-19 NOTE — Progress Notes (Signed)
I transported patient to 3F29 via recliner for Hemodialysis

## 2019-09-19 NOTE — Progress Notes (Signed)
PROGRESS NOTE    Carolyn Valdez  VOZ:[id number] DOB: 1941/05/22 DOA: 09/11/2019 PCP: System, Provider Not In    Brief Narrative:  78 year old female with history of ESRD on hemodialysis Monday Wednesday Friday via right IJ permacath, atrial flutter, chronic diastolic heart failure, severe pulmonary hypertension, GI bleed, breast cancer in remission who was transferred from [facility] hospital per nephrology recommendations for dialysis.  She initially presented to emergency room from dialysis center where she was hypotensive and tachycardic.  Dialysis session had to be stopped early.  She was diagnosed with COVID-19 on 11/4 when she was at assisted living facility.  At the time of presentation in the ER, initial troponin elevated to 2336, global ST depression and 2-1 a flutter on EKG.  Cardiology was consulted for non-STEMI and a flutter.  Patient was started on beta-blockers and heparin infusion.  Plan was to pursue cardiac catheterization, however became hypotensive in  emergency room and transferred to [facility]. Admitted to the hospital treated for COVID-19 pneumonia. Non-STEMI and dialysis need.   Assessment & Plan:   Active Problems:   Respiratory tract infection due to COVID-19 virus  COVID-19 pneumonia: Currently without respiratory symptoms.  She did have elevated inflammatory biomarkers.  Patient finished 5 days of remdesivir therapy.  She is on Decadron, day 8/10.  Clinically improving.  None NSTEMI: Patient had troponin elevation and EKG changes.  Patient was treated with heparin drip and transferred to [facility].  Cardiology following, no plans for cardiac cath and medical management advised.  They will follow up in the future to consider ischemic evaluation.  Currently on aspirin, statin and beta-blockers.  Atrial flutter with RVR: Permanent.  Currently in sinus rhythm.  Patient remains on heparin protocol with Coumadin redosing.  She is on metoprolol 75 mg twice a day.   Continue.  Left lower extremity cellulitis: Treated with ceftriaxone.  Clinically improved.  Total 5 days of therapy.  Hypertension: Blood pressure stable.  Type 2 diabetes with hyperglycemia: Aggravated by use of Decadron.  Until patient is on the steroids, with increased doses of Levemir to 50 units at night and increase prandial insulin.  Keep on sliding scale insulin.  History of seizure disorder: Continue Keppra.  No new seizures.  Physical deconditioning: Patient will benefit with inpatient PT OT before returning to assisted living facility.   DVT prophylaxis: Heparin and Coumadin Code Status: DNR Family Communication: None Disposition Plan: Skilled nursing facility when available.   Consultants:   Nephrology  Procedures:   Dialysis  Antimicrobials:  Antibiotics Given (last 72 hours)    Date/Time Action Medication Dose Rate   09/17/19 0900 New Bag/Given   remdesivir 100 mg in sodium chloride 0.9 % 250 mL IVPB 100 mg 500 mL/hr   09/17/19 1431 New Bag/Given  [patient receiving dialysis]   cefTRIAXone (ROCEPHIN) 2 g in sodium chloride 0.9 % 100 mL IVPB 2 g 200 mL/hr   09/18/19 1025 New Bag/Given   cefTRIAXone (ROCEPHIN) 2 g in sodium chloride 0.9 % 100 mL IVPB 2 g 200 mL/hr         Subjective: Patient seen and examined.  No overnight events.  Remains on room air.  Afebrile.  Poor historian.  Objective: Vitals:   09/19/19 1100 09/19/19 1200 09/19/19 1300 09/19/19 1400  BP:  (!) 119/38  (!) 106/55  Pulse: 74 75 98 78  Resp: 16 17 (!) 22 16  Temp:  98 F (36.7 C)    TempSrc:  SpO2: 98% 99% 98% 98%  Weight:      Height:        Intake/Output Summary (Last 24 hours) at 09/19/2019 1453 Last data filed at 09/19/2019 1400 Gross per 24 hour  Intake 675.64 ml  Output 200 ml  Net 475.64 ml   Filed Weights   09/12/19 1644 09/17/19 1015 09/17/19 1358  Weight: 123 kg 128.4 kg 125 kg    Examination:  General exam: Appears calm and comfortable, on 1 L  oxygen and 98%.  Not in any obvious distress. Respiratory system: Clear to auscultation. Respiratory effort normal.  Right IJ permacath present. Cardiovascular system: S1 & S2 heard, RRR. No JVD, murmurs, rubs, gallops or clicks.  Bilateral none pedal edema. Gastrointestinal system: Abdomen is nondistended, soft and nontender. No organomegaly or masses felt. Normal bowel sounds heard. Central nervous system: Alert and oriented. No focal neurological deficits. Extremities: Symmetric 5 x 5 power. Skin: No rashes, lesions or ulcers Psychiatry: Judgement and insight appear normal. Mood & affect appropriate.     Data Reviewed: I have personally reviewed following labs and imaging studies  CBC: Recent Labs  Lab 09/13/19 0104 09/14/19 0823 09/15/19 0615 09/16/19 0424 09/18/19 0322  WBC 8.1 8.6 11.5* 12.3* 11.2*  NEUTROABS 4.8 6.5 8.9* 9.7*  --   HGB 10.1* 9.4* 9.3* 9.9* 9.7*  HCT 32.7* 30.4* 30.0* 31.6* 31.1*  MCV 93.2 93.5 92.0 92.4 92.3  PLT 371 414* 397 398 528   Basic Metabolic Panel: Recent Labs  Lab 09/13/19 0104 09/14/19 0823  09/15/19 0615 09/16/19 0424 09/17/19 0540 09/18/19 0322 09/19/19 0422  NA 137 134*   < > 130* 129* 128* 131* 127*  K 4.1 4.7   < > 4.2 5.2* 5.0 4.8 4.9  CL 98 96*   < > 93* 93* 93* 94* 92*  CO2 23 26   < > 24 22 23  19* 21*  GLUCOSE 195* 285*   < > 353* 427* 281* 282* 294*  BUN 23 34*   < > 27* 44* 62* 48* 68*  CREATININE 5.25* 6.55*   < > 5.08* 5.92* 6.94* 4.92* 5.52*  CALCIUM 9.0 9.8   < > 8.8* 9.2 8.9 8.5* 9.2  MG 2.0 2.2  --  1.9 2.0  --  1.9  --   PHOS 3.1 3.3   < > 3.0 4.7* 4.5 3.8 4.5   < > = values in this interval not displayed.   GFR: Estimated Creatinine Clearance: 11.9 mL/min (A) (by C-G formula based on SCr of 5.52 mg/dL (H)). Liver Function Tests: Recent Labs  Lab 09/13/19 0104 09/14/19 0823  09/15/19 0615 09/16/19 0424 09/17/19 0540 09/18/19 0322 09/19/19 0422  AST 15 12*  --  17 17  --   --   --   ALT 11 11  --  12  14  --   --   --   ALKPHOS 70 67  --  73 70  --   --   --   BILITOT 0.8 0.5  --  0.8 0.6  --   --   --   PROT 6.4* 6.4*  --  6.3* 6.5  --   --   --   ALBUMIN 2.7* 2.7*   < > 2.9* 3.0* 2.7* 2.8* 2.7*   < > = values in this interval not displayed.   No results for input(s): LIPASE, AMYLASE in the last 168 hours. No results for input(s): AMMONIA in the last 168 hours. Coagulation Profile: Recent  Labs  Lab 09/18/19 0322 09/19/19 0422  INR 1.1 1.2   Cardiac Enzymes: No results for input(s): CKTOTAL, CKMB, CKMBINDEX, TROPONINI in the last 168 hours. BNP (last 3 results) No results for input(s): PROBNP in the last 8760 hours. HbA1C: No results for input(s): HGBA1C in the last 72 hours. CBG: Recent Labs  Lab 09/18/19 1938 09/18/19 2312 09/19/19 0359 09/19/19 0757 09/19/19 1230  GLUCAP 311* 295* 282* 217* 251*   Lipid Profile: No results for input(s): CHOL, HDL, LDLCALC, TRIG, CHOLHDL, LDLDIRECT in the last 72 hours. Thyroid Function Tests: No results for input(s): TSH, T4TOTAL, FREET4, T3FREE, THYROIDAB in the last 72 hours. Anemia Panel: Recent Labs    09/17/19 0540  FERRITIN 341*   Sepsis Labs: No results for input(s): PROCALCITON, LATICACIDVEN in the last 168 hours.  Recent Results (from the past 240 hour(s))  SARS CORONAVIRUS 2 (TAT 6-24 HRS) Nasopharyngeal Nasopharyngeal Swab     Status: Abnormal   Collection Time: 09/10/19  1:24 PM   Specimen: Nasopharyngeal Swab  Result Value Ref Range Status   SARS Coronavirus 2 POSITIVE (A) NEGATIVE Final    Comment: RESULT CALLED TO, READ BACK BY AND VERIFIED WITH: M.JOHNSON RN 2022 09/10/2019 MCCORMICK K (NOTE) SARS-CoV-2 target nucleic acids are DETECTED. The SARS-CoV-2 RNA is generally detectable in upper and lower respiratory specimens during the acute phase of infection. Positive results are indicative of active infection with SARS-CoV-2. Clinical  correlation with patient history and other diagnostic information is  necessary to determine patient infection status. Positive results do  not rule out bacterial infection or co-infection with other viruses. The expected result is Negative. Fact Sheet for Patients: SugarRoll.be Fact Sheet for Healthcare Providers: https://www.woods-mathews.com/ This test is not yet approved or cleared by the [place] FDA and  has been authorized for detection and/or diagnosis of SARS-CoV-2 by FDA under an Emergency Use Authorization (EUA). This EUA will remain  in effect (meaning this test can be used)  for the duration of the COVID-19 declaration under Section 564(b)(1) of the Act, 21 U.S.C. section 360bbb-3(b)(1), unless the authorization is terminated or revoked sooner. Performed at [facility], [place] [address] [place], [place] [address]   MRSA PCR Screening     Status: None   Collection Time: 09/11/19  9:01 PM   Specimen: Nasopharyngeal  Result Value Ref Range Status   MRSA by PCR NEGATIVE NEGATIVE Final    Comment:        The GeneXpert MRSA Assay (FDA approved for NASAL specimens only), is one component of a comprehensive MRSA colonization surveillance program. It is not intended to diagnose MRSA infection nor to guide or monitor treatment for MRSA infections. Performed at [facility], [place] [address] [place], [place] [address]          Radiology Studies: No results found.      Scheduled Meds: . atorvastatin  40 mg Oral q1800  . calcium acetate  1,334 mg Oral TID AC  . Chlorhexidine Gluconate Cloth  6 each Topical Q0600  . citalopram  10 mg Oral Daily  . dexamethasone  6 mg Oral Daily  . docusate sodium  100 mg Oral BID  . insulin aspart  0-15 Units Subcutaneous TID AC & HS  . insulin aspart  10 Units Subcutaneous TID WC  . insulin glargine  50 Units Subcutaneous QHS  . lamoTRIgine  25 mg Oral Daily  . levETIRAcetam  250 mg Oral BID  . mouth rinse  15 mL  Mouth Rinse BID  .  metoprolol tartrate  75 mg Oral BID  . mirabegron ER  25 mg Oral Daily  . OLANZapine  5 mg Oral QHS  . pantoprazole  40 mg Oral Daily  . senna  1 tablet Oral BID  . sodium chloride flush  3 mL Intravenous Q12H  . torsemide  40 mg Oral Once per day on Sun Tue Thu Sat  . vitamin C  500 mg Oral Daily  . warfarin  7.5 mg Oral ONCE-1800  . Warfarin - Pharmacist Dosing Inpatient   Does not apply q1800  . zinc sulfate  220 mg Oral Daily   Continuous Infusions: . cefTRIAXone (ROCEPHIN)  IV 2 g (09/18/19 1025)  . heparin 1,650 Units/hr (09/19/19 1400)     LOS: 8 days    Time spent: 25 minutes    Barb Merino, MD [facility] Pager [phone]

## 2019-09-19 NOTE — Progress Notes (Addendum)
Inpatient Diabetes Program Recommendations  AACE/ADA: New Consensus Statement on Inpatient Glycemic Control (2015)  Target Ranges:  Prepandial:   less than 140 mg/dL      Peak postprandial:   less than 180 mg/dL (1-2 hours)      Critically ill patients:  140 - 180 mg/dL   Lab Results  Component Value Date   GLUCAP 217 (H) 09/19/2019   HGBA1C 7.9 (H) 09/10/2019    Review of Glycemic Control  Results for MIKU, UDALL (MRN [id number]) as of 09/19/2019 11:59  Ref. Range 09/18/2019 16:12 09/18/2019 19:38 09/18/2019 23:12 09/19/2019 03:59 09/19/2019 07:57  Glucose-Capillary Latest Ref Range: 70 - 99 mg/dL 253 (H) Novolog 16units 311 (H) Novolog 11units + Lantus 45 units 295 (H) 282 (H) 217 (H) Novolog 13units    Diabetes history: DM2 Outpatient Diabetes medications: Glipizide 5 mg QD + Novolog 0-5 TID with meals + Lantus 45 units QD Current orders for Inpatient glycemic control: Novolog 0-15 units TID with meals + 0-5 units QHS + Novolog meal coverage 8 units TID with meals + Decadron 6 mg QD  Inpatient Diabetes Program Recommendations:     -Lantus to 50 units QD if steroids continue -increasing meal coverage to 10 units TID with meals if eats at least 50%   Thank you, Geoffry Paradise, RN, BSN Diabetes Coordinator Inpatient Diabetes Program [phone] (team pager from 8a-5p)

## 2019-09-19 NOTE — Evaluation (Signed)
Physical Therapy Evaluation Patient Details Name: Carolyn Valdez MRN: [id number] DOB: 1941-08-21 Today's Date: 09/19/2019   History of Present Illness  78 y.o. female with medical history significant of ESRD on dialysis, chronic diastolic CHF, pulmonary hypertension, type 2 diabetes, GI bleed in Oct 2019, HTN, HLD, DJD, HFpEF, breast cancer, chronic lymphedema, obesity  anxiety/depression.  Chronically on 3L/min O2.  Presented to the ED 11/23 from  dialysis with dyspnea and dizziness. Pt tested COVID + at her ALF Carolyn Valdez).  CXR showed stable cardiomegaly with mild vascular congestion. ECG showed atrial flutter 2-1 AV condution, nonspecific ST and T wave changes.    Clinical Impression  PTA pt resident of [facility] who ambulates in her room with RW and longer facility distances utilizes w/c for mobility. Pt requires assist for bathing and dressing and iADLs. Pt currently limited by dizziness with movement, in presence of generalized weakness and decreased balance. PT recommending SNF level rehab prior to return to her ALF to return to PLOF. PT will continue to follow acutely.      Follow Up Recommendations SNF    Equipment Recommendations  None recommended by PT       Precautions / Restrictions Precautions Precautions: Fall Restrictions Weight Bearing Restrictions: No      Mobility  Bed Mobility Overal bed mobility: Needs Assistance Bed Mobility: Supine to Sit;Sit to Supine     Supine to sit: Mod assist;HOB elevated Sit to supine: Total assist   General bed mobility comments: modA for LE mangagement to EoB, trunk to upright, and pad scoot of hips to EoB  Transfers Overall transfer level: Needs assistance Equipment used: Rolling walker (2 wheeled);1 person hand held assist Transfers: Sit to/from [employer] Sit to Stand: Mod assist   Squat pivot transfers: Total assist     General transfer comment: modA for power up and steadying in RW, total A for squat  pivot transfer drop arm recliner to bed, requires 2x pivot to get hips onto bed  Ambulation/Gait Ambulation/Gait assistance: Mod assist Gait Distance (Feet): 2 Feet Assistive device: Rolling walker (2 wheeled) Gait Pattern/deviations: Step-to pattern;Decreased step length - left;Shuffle;Decreased dorsiflexion - right;Decreased dorsiflexion - left;Trunk flexed Gait velocity: slowed Gait velocity interpretation: <1.31 ft/sec, indicative of household ambulator General Gait Details: modA for steadying for slow, shuffling, mildly unsteady gait from bed to chair decreased eccentic control for descent to chair        Balance Overall balance assessment: Needs assistance Sitting-balance support: Feet supported;No upper extremity supported;Single extremity supported;Bilateral upper extremity supported Sitting balance-Leahy Scale: Fair     Standing balance support: During functional activity;Bilateral upper extremity supported Standing balance-Leahy Scale: Poor Standing balance comment: requires bilateral UE support to maintain upright                             Pertinent Vitals/Pain Pain Assessment: Faces Faces Pain Scale: Hurts even more Pain Location: rectum and LB, increased to 10/10 pain with sitting in recliner Pain Descriptors / Indicators: Sore;Aching;Throbbing Pain Intervention(s): Limited activity within patient's tolerance;Monitored during session;Repositioned    Home Living Family/patient expects to be discharged to:: Assisted living               Home Equipment: Walker - 2 wheels;Wheelchair - Brewing technologist - built in Additional Comments: Resident at [facility]    Prior Function Level of Independence: Needs assistance   Gait / Transfers Assistance Needed: baseline AMB from recliner to BR with  RW; WC for facility distances but unable to self propel  ADL's / Homemaking Assistance Needed: MaxA for bathing/dressing; unable to wipe herself posteriorly  when toiletting        Hand Dominance        Extremity/Trunk Assessment   Upper Extremity Assessment Upper Extremity Assessment: Generalized weakness    Lower Extremity Assessment Lower Extremity Assessment: RLE deficits/detail;LLE deficits/detail RLE Deficits / Details: ROM limited by LE edema and body habitus, strength grossly 3+/5 RLE Sensation: history of peripheral neuropathy(decreased sensation from mid calf down) LLE Deficits / Details: ROM limited by LE edema and body habitus, strength grossly 3+/5 LLE Sensation: history of peripheral neuropathy(decreased sensation from mid calf down)       Communication   Communication: No difficulties  Cognition Arousal/Alertness: Awake/alert Behavior During Therapy: WFL for tasks assessed/performed Overall Cognitive Status: Within Functional Limits for tasks assessed                                        General Comments General comments (skin integrity, edema, etc.): increased LE edema bilaterally, VSS on 3L O2 via [place], dizziness in sitting however BP consistent with supine BP        Assessment/Plan    PT Assessment Patient needs continued PT services  PT Problem List Decreased strength;Decreased range of motion;Decreased activity tolerance;Decreased balance;Decreased mobility;Decreased coordination;Decreased safety awareness;Pain;Impaired sensation       PT Treatment Interventions DME instruction;Gait training;Functional mobility training;Therapeutic activities;Therapeutic exercise;Balance training;Cognitive remediation;Patient/family education    PT Goals (Current goals can be found in the Care Plan section)  Acute Rehab PT Goals Patient Stated Goal: go back to ALF PT Goal Formulation: With patient Time For Goal Achievement: 10/03/19 Potential to Achieve Goals: Fair    Frequency Min 2X/week    AM-PAC PT "6 Clicks" Mobility  Outcome Measure Help needed turning from your back to your side while in  a flat bed without using bedrails?: A Little Help needed moving from lying on your back to sitting on the side of a flat bed without using bedrails?: A Lot Help needed moving to and from a bed to a chair (including a wheelchair)?: Total Help needed standing up from a chair using your arms (e.g., wheelchair or bedside chair)?: A Lot Help needed to walk in hospital room?: Total Help needed climbing 3-5 steps with a railing? : Total 6 Click Score: 10    End of Session Equipment Utilized During Treatment: Oxygen;Gait belt Activity Tolerance: Patient limited by pain;Patient limited by fatigue Patient left: in bed;with call bell/phone within reach;with bed alarm set Nurse Communication: Mobility status PT Visit Diagnosis: Unsteadiness on feet (R26.81);Other abnormalities of gait and mobility (R26.89);Muscle weakness (generalized) (M62.81);Difficulty in walking, not elsewhere classified (R26.2);Dizziness and giddiness (R42);Pain Pain - part of body: (rectum and low back)    Time: 4818-5631 PT Time Calculation (min) (ACUTE ONLY): 53 min   Charges:   PT Evaluation $PT Eval Moderate Complexity: 1 Mod PT Treatments $Therapeutic Activity: 38-52 mins        Havyn Ramo B. Migdalia Dk PT, DPT Acute Rehabilitation Services Pager [phone] Office [phone]   [place] 09/19/2019, 1:25 PM

## 2019-09-20 LAB — CBC
HCT: 29.9 % — ABNORMAL LOW (ref 36.0–46.0)
Hemoglobin: 9.7 g/dL — ABNORMAL LOW (ref 12.0–15.0)
MCH: 29.1 pg (ref 26.0–34.0)
MCHC: 32.4 g/dL (ref 30.0–36.0)
MCV: 89.8 fL (ref 80.0–100.0)
Platelets: 257 10*3/uL (ref 150–400)
RBC: 3.33 MIL/uL — ABNORMAL LOW (ref 3.87–5.11)
RDW: 15.7 % — ABNORMAL HIGH (ref 11.5–15.5)
WBC: 11.1 10*3/uL — ABNORMAL HIGH (ref 4.0–10.5)
nRBC: 0 % (ref 0.0–0.2)

## 2019-09-20 LAB — RENAL FUNCTION PANEL
Albumin: 2.7 g/dL — ABNORMAL LOW (ref 3.5–5.0)
Anion gap: 11 (ref 5–15)
BUN: 35 mg/dL — ABNORMAL HIGH (ref 8–23)
CO2: 25 mmol/L (ref 22–32)
Calcium: 8.3 mg/dL — ABNORMAL LOW (ref 8.9–10.3)
Chloride: 94 mmol/L — ABNORMAL LOW (ref 98–111)
Creatinine, Ser: 3.66 mg/dL — ABNORMAL HIGH (ref 0.44–1.00)
GFR calc Af Amer: 13 mL/min — ABNORMAL LOW (ref >60)
GFR calc non Af Amer: 11 mL/min — ABNORMAL LOW (ref >60)
Glucose, Bld: 311 mg/dL — ABNORMAL HIGH (ref 70–99)
Phosphorus: 3.9 mg/dL (ref 2.5–4.6)
Potassium: 4.8 mmol/L (ref 3.5–5.1)
Sodium: 130 mmol/L — ABNORMAL LOW (ref 135–145)

## 2019-09-20 LAB — GLUCOSE, CAPILLARY
Glucose-Capillary: 193 mg/dL — ABNORMAL HIGH (ref 70–99)
Glucose-Capillary: 249 mg/dL — ABNORMAL HIGH (ref 70–99)
Glucose-Capillary: 273 mg/dL — ABNORMAL HIGH (ref 70–99)
Glucose-Capillary: 296 mg/dL — ABNORMAL HIGH (ref 70–99)
Glucose-Capillary: 297 mg/dL — ABNORMAL HIGH (ref 70–99)

## 2019-09-20 LAB — PROTIME-INR
INR: 1.3 — ABNORMAL HIGH (ref 0.8–1.2)
Prothrombin Time: 16.4 seconds — ABNORMAL HIGH (ref 11.4–15.2)

## 2019-09-20 LAB — HEPARIN LEVEL (UNFRACTIONATED): Heparin Unfractionated: 1.52 IU/mL — ABNORMAL HIGH (ref 0.30–0.70)

## 2019-09-20 MED ORDER — WARFARIN SODIUM 10 MG PO TABS
10.0000 mg | ORAL_TABLET | Freq: Once | ORAL | Status: AC
  Administered 2019-09-20: 17:00:00 10 mg via ORAL
  Filled 2019-09-20: qty 1

## 2019-09-20 MED ORDER — HEPARIN (PORCINE) 25000 UT/250ML-% IV SOLN
1350.0000 [IU]/h | INTRAVENOUS | Status: DC
  Administered 2019-09-20: 1350 [IU]/h via INTRAVENOUS

## 2019-09-20 NOTE — Progress Notes (Signed)
Pt heart rate dropping into mid and low 50's. Pt asymptomatic. MD paged twice.

## 2019-09-20 NOTE — Progress Notes (Signed)
Used Secure chat to communicate with patient via social work. I tried to ask patient if she would be agreeable to a SNF and at first seemed okay, she just wanted to know where they were. When I told her, she started to get a little upset saying that was too far to drive to get to her dialysis so why couldn't she just go home.   I tried to explain to her that [name] wasn't taking people who were covid positive back yet, and besides they wanted her to get more physical therapy and rehab at a SNF first.   She didn't seem to understand this and got a little tearful saying she sold everything to go to [facility] and it's her home. She said other people were covid positive and came back, and she said she was getting physical therapy there too, so why couldn't she go back.  I relayed the information back to social work and informed social work there might need to be another conversation as it was clear not much was going to get accomplished with Carolyn Valdez upset like this. CSW said not to press the issue and she would see what she could do with [facility].   I stayed with Carolyn. Cockrell a little longer and listened to her concerns. I practiced some therapeutic communication and she seemed in better spirits when I left the room.

## 2019-09-20 NOTE — Progress Notes (Signed)
Multiple medications were not given during day shift and pushed to nightshift as patient was in dialysis in the late afternoon into early evening.   Patient had not eaten most of the day and had a poor appetite. Evening insulin was held due to a capillary glucose of 91.  Spoke with Pharmacist and discussed all the medications and what medications could be rescheduled.  See MAR for reference.

## 2019-09-20 NOTE — TOC Initial Note (Signed)
Transition of Care [facility]) - Initial/Assessment Note    Patient Details  Name: Carolyn Valdez MRN: [id number] Date of Birth: Feb 22, 1941  [facility] CM/SW Contact:    Archie Endo, LCSW Phone Number: 09/20/2019, 2:11 PM  Clinical Narrative:                 CSW attempted to speak with patient via E Link but was unable to successfully complete assessment due to intereference from negative pressure system. CSW requested RN to assist with assessment in her room.   Patient refused the offer for SNF placement at discharge. Patient desires to return to [facility]. CSW explained to RN to inform patient that her COVID positive status may prevent her return directly to [facility].  CSW spoke with Robert [facility]ellow Liaison at [phone] to determine if this patient was eligible to return to the facility. CSW is currently awaiting a return call.   Expected Discharge Plan: Assisted Living Barriers to Discharge: Other (comment)(COVID positive)   Patient Goals and CMS Choice Patient states their goals for this hospitalization and ongoing recovery are:: Go back to [facility]   Choice offered to / list presented to : NA  Expected Discharge Plan and Services Expected Discharge Plan: Assisted Living In-house Referral: Clinical Social Work   Post Acute Care Choice: Nursing Home Living arrangements for the past 2 months: [place]                                      Prior Living Arrangements/Services Living arrangements for the past 2 months: [place] Lives with:: Facility Resident Patient language and need for interpreter reviewed:: No Do you feel safe going back to the place where you live?: Yes      Need for Family Participation in Patient Care: No (Comment) Care giver support system in place?: Yes (comment)   Criminal Activity/Legal Involvement Pertinent to Current Situation/Hospitalization: No - Comment as needed  Activities of Daily  Living Home Assistive Devices/Equipment: CBG Meter, Grab bars in shower ADL Screening (condition at time of admission) Patient's cognitive ability adequate to safely complete daily activities?: Yes Is the patient deaf or have difficulty hearing?: No Does the patient have difficulty seeing, even when wearing glasses/contacts?: No Does the patient have difficulty concentrating, remembering, or making decisions?: No Patient able to express need for assistance with ADLs?: Yes Does the patient have difficulty dressing or bathing?: Yes Independently performs ADLs?: No Does the patient have difficulty walking or climbing stairs?: Yes Weakness of Legs: Both Weakness of Arms/Hands: None  Permission Sought/Granted Permission sought to share information with : Case Manager Permission granted to share information with : Yes, Release of Information Signed  Share Information with NAME: [name]  Permission granted to share info w AGENCY: ALF  Permission granted to share info w Relationship: Sister     Emotional Assessment Appearance:: Appears stated age Attitude/Demeanor/Rapport: Unable to Assess Affect (typically observed): Unable to Assess Orientation: : Oriented to Self, Oriented to Place, Oriented to Situation, Oriented to  Time Alcohol / Substance Use: Not Applicable Psych Involvement: No (comment)  Admission diagnosis:  covid 19 ATRIAL FLUTTER RENAL FAILURE Patient Active Problem List   Diagnosis Date Noted  . Atrial flutter ([place]) 09/11/2019  . Respiratory tract infection due to COVID-19 virus 09/11/2019  . Atrial flutter, paroxysmal ([place]) 09/10/2019  . Elevated troponin 09/10/2019  . Dyspnea 09/10/2019  .  GERD (gastroesophageal reflux disease) 09/10/2019  . Degenerative joint disease 09/10/2019  . Pulmonary hypertension ([place]) 09/10/2019  . Hypotension 09/10/2019  . Lab test positive for detection of COVID-19 virus 09/10/2019  . NSTEMI (non-ST elevated myocardial infarction)  ([place])   . ESRD on dialysis ([facility]) 07/24/2019  . Dizziness 07/03/2019  . Chronic diastolic CHF (congestive heart failure) ([place]) 03/02/2019  . Anemia associated with chronic renal failure 10/25/2018  . Acute hemorrhagic gastritis   . Angiodysplasia of stomach and duodenum   . CKD (chronic kidney disease), stage III 08/04/2018  . Atrial fibrillation with RVR ([place]) 04/23/2018  . Syncope 04/22/2018  . Acute respiratory failure with hypoxia and hypercapnia ([place]) 02/21/2017  . Adjustment disorder with anxiety 01/28/2017  . Acute on chronic respiratory failure with hypoxia ([place]) 01/23/2017  . Palliative care encounter   . Goals of care, counseling/discussion   . DNR (do not resuscitate) 10/05/2016  . Palliative care by specialist 10/05/2016  . Depression, major, recurrent, severe with psychosis ([place]) 10/05/2016  . Severe major depression, single episode, with psychotic features ([place]) 10/04/2016  . Cellulitis of leg, right 09/29/2016  . Hypercapnia 09/29/2016  . Weakness 09/29/2016  . Gastrointestinal hemorrhage   . Altered mental status 09/24/2016  . Pressure injury of skin 09/02/2016  . Respiratory failure with hypoxia ([place]) 09/01/2016  . Lymphedema 08/16/2016  . Acute on chronic heart failure with preserved ejection fraction (HFpEF) ([place]) 08/09/2016  . Acquired lymphedema of leg 04/21/2016  . Nocturia 11/05/2015  . Urinary frequency 11/05/2015  . Acute respiratory failure with hypoxia ([place]) 09/05/2015  . 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 Incontinence 04/20/2015  . Diabetes mellitus, type 2 ([place]) 04/17/2015  . ESBL (extended spectrum beta-lactamase) producing bacteria infection 04/17/2015  . Hypertension 04/17/2015  . Frequent UTI 04/17/2015  . Absence of bladder continence 04/17/2015  . Iron deficiency anemia 03/08/2015  . Symptomatic anemia 09/25/2014  . Abdominal pain, lower 09/25/2014  . Urge incontinence 02/19/2013  . Bladder infection, chronic 08/11/2012  . Difficult or painful  urination 07/26/2012  . Lower urinary tract infection 12/31/2011  . Diabetes mellitus ([place]) 12/31/2011   PCP:  System, Provider Not In Pharmacy:   [facility] [address] [place] [address] Phone: [phone] Fax: [phone]  [place], [place] [facility] [address] [place] [address] Phone: [phone] Fax: [phone]     Social Determinants of Health (SDOH) Interventions    Readmission Risk Interventions Readmission Risk Prevention Plan 07/04/2019 03/19/2019  Transportation Screening Complete Complete  Medication Review Press photographer) Complete Complete  PCP or Specialist appointment within 3-5 days of discharge Complete Complete  HRI or Home Care Consult Complete Complete  SW Recovery Care/Counseling Consult Complete Not Complete  SW Consult Not Complete Comments - na  Palliative Care Screening Not Applicable Not [place] Patient Refused Not Applicable  Some recent data might be hidden

## 2019-09-20 NOTE — Progress Notes (Signed)
PROGRESS NOTE    Carolyn Valdez  FUX:[id number] DOB: 06-26-1941 DOA: 09/11/2019 PCP: System, Provider Not In    Brief Narrative:  78 year old female with history of ESRD on hemodialysis Monday Wednesday Friday via right IJ permacath, atrial flutter, chronic diastolic heart failure, severe pulmonary hypertension, GI bleed, breast cancer in remission who was transferred from [facility] hospital per nephrology recommendations for dialysis.  She initially presented to emergency room from dialysis center where she was hypotensive and tachycardic.  Dialysis session had to be stopped early.  She was diagnosed with COVID-19 on 11/4 when she was at assisted living facility.  At the time of presentation in the ER, initial troponin elevated to 2336, global ST depression and 2-1 a flutter on EKG.  Cardiology was consulted for non-STEMI and a flutter.  Patient was started on beta-blockers and heparin infusion.  Plan was to pursue cardiac catheterization, however became hypotensive in  emergency room and transferred to [facility]. Admitted to the hospital treated for COVID-19 pneumonia, Non-STEMI and dialysis need.   Assessment & Plan:   Active Problems:   Respiratory tract infection due to COVID-19 virus  COVID-19 pneumonia: Currently without respiratory symptoms.  She did have elevated inflammatory biomarkers.  Patient finished 5 days of remdesivir therapy.  She is on Decadron, day 9/10.  Clinically improving.  NSTEMI: Patient had troponin elevation and EKG changes.  Patient was treated with heparin drip and transferred to [facility].  Cardiology following, no plans for cardiac cath and medical management advised.  They will follow up in the future to consider ischemic evaluation.  Currently on aspirin, statin and beta-blockers. Do not see benefit of continuing heparin drip, will discontinue.  Atrial flutter with RVR: Permanent.  Heart rate ranges from 48-120.  Today in the morning persistently low.   Metoprolol with parameters.  Patient remains on heparin protocol with Coumadin redosing.  She is on metoprolol 75 mg twice a day.  Continue. Patient will not need bridging with heparin, discontinue heparin.  Continue Coumadin.  Left lower extremity cellulitis: Treated with ceftriaxone.  Clinically improved.  Total 5 days of therapy.  Discontinue further antibiotics.  Hypertension: Blood pressure stable.  Type 2 diabetes with hyperglycemia: Aggravated by use of Decadron.  On increased dose of insulin and acceptable.   History of seizure disorder: Continue Keppra.  No new seizures.  Physical deconditioning: Patient will benefit with inpatient PT OT before returning to assisted living facility.   DVT prophylaxis: Coumadin. Code Status: DNR Family Communication: None Disposition Plan: Skilled nursing facility when available.  Patient is medically stable to transfer to skilled level of care.  Patient will need dialysis arrangements when she goes to SNF.   Consultants:   Nephrology  Procedures:   Dialysis  Antimicrobials:  Antibiotics Given (last 72 hours)    Date/Time Action Medication Dose Rate   09/17/19 1431 New Bag/Given  [patient receiving dialysis]   cefTRIAXone (ROCEPHIN) 2 g in sodium chloride 0.9 % 100 mL IVPB 2 g 200 mL/hr   09/18/19 1025 New Bag/Given   cefTRIAXone (ROCEPHIN) 2 g in sodium chloride 0.9 % 100 mL IVPB 2 g 200 mL/hr   09/19/19 2228 New Bag/Given   cefTRIAXone (ROCEPHIN) 2 g in sodium chloride 0.9 % 100 mL IVPB 2 g 200 mL/hr         Subjective: Seen and examined.  No overnight events.  Had some success with multiple laxatives.  He still wants to have good bowel movements.  Mostly on room  air.  Afebrile.  Poor historian overall.  Objective: Vitals:   09/20/19 0700 09/20/19 0800 09/20/19 0928 09/20/19 1000  BP:  (!) 116/50  (!) 95/42  Pulse: (!) 102 (!) 104 (!) 54 (!) 50  Resp: 16 18  15   Temp:  98.5 F (36.9 C)    TempSrc:  Axillary    SpO2:  98% 99%  98%  Weight:      Height:        Intake/Output Summary (Last 24 hours) at 09/20/2019 1041 Last data filed at 09/20/2019 0800 Gross per 24 hour  Intake 582.34 ml  Output 200 ml  Net 382.34 ml   Filed Weights   09/12/19 1644 09/17/19 1015 09/17/19 1358  Weight: 123 kg 128.4 kg 125 kg    Examination:  General exam: Appears calm and comfortable, on 1-2  L oxygen and 98%.  Not in any obvious distress. Respiratory system: Clear to auscultation. Respiratory effort normal.  Right IJ permacath present. Cardiovascular system: S1 & S2 heard, RRR. No JVD, murmurs, rubs, gallops or clicks.  Bilateral non-pedal edema. Gastrointestinal system: Abdomen is nondistended, soft and nontender. No organomegaly or masses felt. Normal bowel sounds heard. Central nervous system: Alert and oriented. No focal neurological deficits. Extremities: Symmetric 5 x 5 power. Skin: No rashes, lesions or ulcers Psychiatry: Judgement and insight appear normal. Mood & affect appropriate.     Data Reviewed: I have personally reviewed following labs and imaging studies  CBC: Recent Labs  Lab 09/14/19 0823 09/15/19 0615 09/16/19 0424 09/18/19 0322 09/20/19 0444  WBC 8.6 11.5* 12.3* 11.2* 11.1*  NEUTROABS 6.5 8.9* 9.7*  --   --   HGB 9.4* 9.3* 9.9* 9.7* 9.7*  HCT 30.4* 30.0* 31.6* 31.1* 29.9*  MCV 93.5 92.0 92.4 92.3 89.8  PLT 414* 397 398 300 379   Basic Metabolic Panel: Recent Labs  Lab 09/14/19 0823  09/15/19 0615 09/16/19 0424 09/17/19 0540 09/18/19 0322 09/19/19 0422 09/20/19 0444  NA 134*   < > 130* 129* 128* 131* 127* 130*  K 4.7   < > 4.2 5.2* 5.0 4.8 4.9 4.8  CL 96*   < > 93* 93* 93* 94* 92* 94*  CO2 26   < > 24 22 23  19* 21* 25  GLUCOSE 285*   < > 353* 427* 281* 282* 294* 311*  BUN 34*   < > 27* 44* 62* 48* 68* 35*  CREATININE 6.55*   < > 5.08* 5.92* 6.94* 4.92* 5.52* 3.66*  CALCIUM 9.8   < > 8.8* 9.2 8.9 8.5* 9.2 8.3*  MG 2.2  --  1.9 2.0  --  1.9  --   --   PHOS 3.3   < >  3.0 4.7* 4.5 3.8 4.5 3.9   < > = values in this interval not displayed.   GFR: Estimated Creatinine Clearance: 18 mL/min (A) (by C-G formula based on SCr of 3.66 mg/dL (H)). Liver Function Tests: Recent Labs  Lab 09/14/19 0823  09/15/19 0615 09/16/19 0424 09/17/19 0540 09/18/19 0322 09/19/19 0422 09/20/19 0444  AST 12*  --  17 17  --   --   --   --   ALT 11  --  12 14  --   --   --   --   ALKPHOS 67  --  73 70  --   --   --   --   BILITOT 0.5  --  0.8 0.6  --   --   --   --  PROT 6.4*  --  6.3* 6.5  --   --   --   --   ALBUMIN 2.7*   < > 2.9* 3.0* 2.7* 2.8* 2.7* 2.7*   < > = values in this interval not displayed.   No results for input(s): LIPASE, AMYLASE in the last 168 hours. No results for input(s): AMMONIA in the last 168 hours. Coagulation Profile: Recent Labs  Lab 09/18/19 0322 09/19/19 0422 09/20/19 0444  INR 1.1 1.2 1.3*   Cardiac Enzymes: No results for input(s): CKTOTAL, CKMB, CKMBINDEX, TROPONINI in the last 168 hours. BNP (last 3 results) No results for input(s): PROBNP in the last 8760 hours. HbA1C: No results for input(s): HGBA1C in the last 72 hours. CBG: Recent Labs  Lab 09/19/19 0757 09/19/19 1230 09/19/19 2121 09/20/19 0004 09/20/19 0511  GLUCAP 217* 251* 91 193* 297*   Lipid Profile: No results for input(s): CHOL, HDL, LDLCALC, TRIG, CHOLHDL, LDLDIRECT in the last 72 hours. Thyroid Function Tests: No results for input(s): TSH, T4TOTAL, FREET4, T3FREE, THYROIDAB in the last 72 hours. Anemia Panel: No results for input(s): VITAMINB12, FOLATE, FERRITIN, TIBC, IRON, RETICCTPCT in the last 72 hours. Sepsis Labs: No results for input(s): PROCALCITON, LATICACIDVEN in the last 168 hours.  Recent Results (from the past 240 hour(s))  SARS CORONAVIRUS 2 (TAT 6-24 HRS) Nasopharyngeal Nasopharyngeal Swab     Status: Abnormal   Collection Time: 09/10/19  1:24 PM   Specimen: Nasopharyngeal Swab  Result Value Ref Range Status   SARS Coronavirus 2  POSITIVE (A) NEGATIVE Final    Comment: RESULT CALLED TO, READ BACK BY AND VERIFIED WITH: M.JOHNSON RN 2022 09/10/2019 MCCORMICK K (NOTE) SARS-CoV-2 target nucleic acids are DETECTED. The SARS-CoV-2 RNA is generally detectable in upper and lower respiratory specimens during the acute phase of infection. Positive results are indicative of active infection with SARS-CoV-2. Clinical  correlation with patient history and other diagnostic information is necessary to determine patient infection status. Positive results do  not rule out bacterial infection or co-infection with other viruses. The expected result is Negative. Fact Sheet for Patients: SugarRoll.be Fact Sheet for Healthcare Providers: https://www.woods-mathews.com/ This test is not yet approved or cleared by the [place] FDA and  has been authorized for detection and/or diagnosis of SARS-CoV-2 by FDA under an Emergency Use Authorization (EUA). This EUA will remain  in effect (meaning this test can be used)  for the duration of the COVID-19 declaration under Section 564(b)(1) of the Act, 21 U.S.C. section 360bbb-3(b)(1), unless the authorization is terminated or revoked sooner. Performed at [facility], [place] [address], [place], [place] [address]   MRSA PCR Screening     Status: None   Collection Time: 09/11/19  9:01 PM   Specimen: Nasopharyngeal  Result Value Ref Range Status   MRSA by PCR NEGATIVE NEGATIVE Final    Comment:        The GeneXpert MRSA Assay (FDA approved for NASAL specimens only), is one component of a comprehensive MRSA colonization surveillance program. It is not intended to diagnose MRSA infection nor to guide or monitor treatment for MRSA infections. Performed at [facility], [place] [address] [place], [place] [address]          Radiology Studies: No results found.      Scheduled Meds: . atorvastatin  40 mg Oral q1800  .  calcium acetate  1,334 mg Oral TID AC  . Chlorhexidine Gluconate Cloth  6 each Topical Q0600  . citalopram  10 mg Oral Daily  . dexamethasone  6 mg Oral Daily  . docusate sodium  100 mg Oral BID  . insulin aspart  0-15 Units Subcutaneous TID AC & HS  . insulin aspart  10 Units Subcutaneous TID WC  . insulin glargine  50 Units Subcutaneous QHS  . lamoTRIgine  25 mg Oral Daily  . levETIRAcetam  250 mg Oral BID  . mouth rinse  15 mL Mouth Rinse BID  . metoprolol tartrate  75 mg Oral BID  . mirabegron ER  25 mg Oral Daily  . OLANZapine  5 mg Oral QHS  . pantoprazole  40 mg Oral Daily  . senna  1 tablet Oral BID  . sodium chloride flush  3 mL Intravenous Q12H  . torsemide  40 mg Oral Once per day on Sun Tue Thu Sat  . vitamin C  500 mg Oral Daily  . warfarin  10 mg Oral ONCE-1800  . Warfarin - Pharmacist Dosing Inpatient   Does not apply q1800  . zinc sulfate  220 mg Oral Daily   Continuous Infusions:    LOS: 9 days    Time spent: 25 minutes    Barb Merino, MD [facility] Pager [phone]

## 2019-09-20 NOTE — Progress Notes (Signed)
Patient ID: Carolyn Valdez, female   DOB: July 01, 1941, 78 y.o.   MRN: [id number] S: No events overnight and tolerated dialysis without issue. O:BP (!) 110/54   Pulse (!) 109   Temp (!) 97.4 F (36.3 C) (Axillary)   Resp 15   Ht 5\' 7"  (1.702 m)   Wt 125 kg   SpO2 99%   BMI 43.16 kg/m   Intake/Output Summary (Last 24 hours) at 09/20/2019 1303 Last data filed at 09/20/2019 1000 Gross per 24 hour  Intake 420.56 ml  Output 200 ml  Net 220.56 ml   Intake/Output: I/O last 3 completed shifts: In: 913.6 [P.O.:220; I.V.:593.5; IV Piggyback:100.1] Out: 200 [Urine:200]  Intake/Output this shift:  Total I/O In: 23.6 [I.V.:23.6] Out: -  Weight change:  Gen: WD WN WF in NAD sitting in bed  Recent Labs  Lab 09/14/19 0823 09/14/19 0824 09/15/19 0615 09/16/19 0424 09/17/19 0540 09/18/19 0322 09/19/19 0422 09/20/19 0444  NA 134* 132* 130* 129* 128* 131* 127* 130*  K 4.7 4.6 4.2 5.2* 5.0 4.8 4.9 4.8  CL 96* 95* 93* 93* 93* 94* 92* 94*  CO2 26 26 24 22 23  19* 21* 25  GLUCOSE 285* 282* 353* 427* 281* 282* 294* 311*  BUN 34* 34* 27* 44* 62* 48* 68* 35*  CREATININE 6.55* 6.53* 5.08* 5.92* 6.94* 4.92* 5.52* 3.66*  ALBUMIN 2.7* 2.7* 2.9* 3.0* 2.7* 2.8* 2.7* 2.7*  CALCIUM 9.8 9.7 8.8* 9.2 8.9 8.5* 9.2 8.3*  PHOS 3.3 3.2 3.0 4.7* 4.5 3.8 4.5 3.9  AST 12*  --  17 17  --   --   --   --   ALT 11  --  12 14  --   --   --   --    Liver Function Tests: Recent Labs  Lab 09/14/19 0823  09/15/19 0615 09/16/19 0424  09/18/19 0322 09/19/19 0422 09/20/19 0444  AST 12*  --  17 17  --   --   --   --   ALT 11  --  12 14  --   --   --   --   ALKPHOS 67  --  73 70  --   --   --   --   BILITOT 0.5  --  0.8 0.6  --   --   --   --   PROT 6.4*  --  6.3* 6.5  --   --   --   --   ALBUMIN 2.7*   < > 2.9* 3.0*   < > 2.8* 2.7* 2.7*   < > = values in this interval not displayed.   No results for input(s): LIPASE, AMYLASE in the last 168 hours. No results for input(s): AMMONIA in the last 168  hours. CBC: Recent Labs  Lab 09/14/19 0823 09/15/19 0615 09/16/19 0424 09/18/19 0322 09/20/19 0444  WBC 8.6 11.5* 12.3* 11.2* 11.1*  NEUTROABS 6.5 8.9* 9.7*  --   --   HGB 9.4* 9.3* 9.9* 9.7* 9.7*  HCT 30.4* 30.0* 31.6* 31.1* 29.9*  MCV 93.5 92.0 92.4 92.3 89.8  PLT 414* 397 398 300 257   Cardiac Enzymes: No results for input(s): CKTOTAL, CKMB, CKMBINDEX, TROPONINI in the last 168 hours. CBG: Recent Labs  Lab 09/19/19 2121 09/20/19 0004 09/20/19 0511 09/20/19 0800 09/20/19 1137  GLUCAP 91 193* 297* 296* 249*    Iron Studies: No results for input(s): IRON, TIBC, TRANSFERRIN, FERRITIN in the last 72 hours. Studies/Results: No results found. Marland Kitchen atorvastatin  40 mg Oral q1800  . calcium acetate  1,334 mg Oral TID AC  . Chlorhexidine Gluconate Cloth  6 each Topical Q0600  . citalopram  10 mg Oral Daily  . dexamethasone  6 mg Oral Daily  . docusate sodium  100 mg Oral BID  . insulin aspart  0-15 Units Subcutaneous TID AC & HS  . insulin aspart  10 Units Subcutaneous TID WC  . insulin glargine  50 Units Subcutaneous QHS  . lamoTRIgine  25 mg Oral Daily  . levETIRAcetam  250 mg Oral BID  . mouth rinse  15 mL Mouth Rinse BID  . metoprolol tartrate  75 mg Oral BID  . mirabegron ER  25 mg Oral Daily  . OLANZapine  5 mg Oral QHS  . pantoprazole  40 mg Oral Daily  . senna  1 tablet Oral BID  . sodium chloride flush  3 mL Intravenous Q12H  . torsemide  40 mg Oral Once per day on Sun Tue Thu Sat  . vitamin C  500 mg Oral Daily  . warfarin  10 mg Oral ONCE-1800  . Warfarin - Pharmacist Dosing Inpatient   Does not apply q1800  . zinc sulfate  220 mg Oral Daily    BMET    Component Value Date/Time   NA 130 (L) 09/20/2019 0444   NA 138 09/28/2018 1517   NA 137 09/08/2014 1042   K 4.8 09/20/2019 0444   K 5.2 (H) 09/08/2014 1042   CL 94 (L) 09/20/2019 0444   CL 103 09/08/2014 1042   CO2 25 09/20/2019 0444   CO2 28 09/08/2014 1042   GLUCOSE 311 (H) 09/20/2019 0444    GLUCOSE 211 (H) 09/08/2014 1042   BUN 35 (H) 09/20/2019 0444   BUN 33 (H) 09/28/2018 1517   BUN 16 09/08/2014 1042   CREATININE 3.66 (H) 09/20/2019 0444   CREATININE 1.45 (H) 09/08/2014 1042   CALCIUM 8.3 (L) 09/20/2019 0444   CALCIUM 8.3 (L) 09/08/2014 1042   GFRNONAA 11 (L) 09/20/2019 0444   GFRNONAA 38 (L) 09/08/2014 1042   GFRNONAA 45 (L) 07/09/2014 1631   GFRAA 13 (L) 09/20/2019 0444   GFRAA 46 (L) 09/08/2014 1042   GFRAA 52 (L) 07/09/2014 1631   CBC    Component Value Date/Time   WBC 11.1 (H) 09/20/2019 0444   RBC 3.33 (L) 09/20/2019 0444   HGB 9.7 (L) 09/20/2019 0444   HGB 8.9 (L) 09/28/2018 1517   HCT 29.9 (L) 09/20/2019 0444   HCT 27.8 (L) 09/28/2018 1517   PLT 257 09/20/2019 0444   PLT 286 09/28/2018 1517   MCV 89.8 09/20/2019 0444   MCV 90 09/28/2018 1517   MCV 80 10/31/2014 1400   MCH 29.1 09/20/2019 0444   MCHC 32.4 09/20/2019 0444   RDW 15.7 (H) 09/20/2019 0444   RDW 13.2 09/28/2018 1517   RDW 17.4 (H) 10/31/2014 1400   LYMPHSABS 1.3 09/16/2019 0424   LYMPHSABS 1.4 10/31/2014 1400   MONOABS 1.0 09/16/2019 0424   MONOABS 0.7 10/31/2014 1400   EOSABS 0.0 09/16/2019 0424   EOSABS 0.3 10/31/2014 1400   BASOSABS 0.0 09/16/2019 0424   BASOSABS 0.0 10/31/2014 1400    Dialysis Prescription: [place], MWF, edw 124.5 kg, 2K/2.5Ca, BFR 400, DFR 800 Heparin 1700 bolus, then 1000 units/hr, epo 1400 IVP tiw, venofer 100mg  IV qweek  Assessment/Plan:  1. ESRD- continue with MWF schedule. Torsemide on non HD days per outpatient regimen 2. covid 19 infectionc/b NSTEMI cont medical  management. Currently asymptomatic. Treated with remdesivir and decadron. 3. A flutter/afib with RVR- per cardiologydoing well with metoprolol 75 mg bid, on IV heparin, coumadin per pharmacy 4. 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 LLE cellulitis- improving with ceftriaxone  6. DM- per primary 7. CHF- with preserved EF. UF with HD as tolerated.  8. Hyponatremia- due to  CHF- follow with UF 9. Hyperkalemia-improved with HD. 10. H/o seizure disorder- on keppra 11. Disposition- hopefully nearing discharge after treatment for covid completed and pt remains symptom free. Donetta Potts, MD [employer] [phone]

## 2019-09-20 NOTE — Progress Notes (Signed)
ANTICOAGULATION CONSULT NOTE - Follow Up Consult  Pharmacy Consult for heparin and Warfarin Indication: chest pain/ACS and atrial fibrillation  Allergies  Allergen Reactions  . Sulfa Antibiotics Hives  . Biaxin [Clarithromycin] Hives  . Buspar [Buspirone]   . Influenza A (H1n1) Monoval Vac Other (See Comments)    Pt states that she was told by her MD not to get the influenza vaccine.    . Morphine Other (See Comments)    Reaction:  Dizziness and confusion   . Pyridium [Phenazopyridine Hcl] Other (See Comments)    Reaction:  Unknown   . Tuberculin Tests   . Ceftriaxone Anxiety  . Latex Rash  . Prednisone Rash  . Tape Rash    Patient Measurements: Height: 5\' 7"  (170.2 cm) Weight: 275 lb 9.2 oz (125 kg) IBW/kg (Calculated) : 61.6 Heparin Dosing Weight: 91 kg  Vital Signs: Temp: 97.7 F (36.5 C) (12/03 0400) Temp Source: Oral (12/03 0400) BP: 116/56 (12/03 0600) Pulse Rate: 102 (12/03 0700)  Labs: Recent Labs    09/18/19 0322 09/19/19 0422 09/20/19 0444  HGB 9.7*  --  9.7*  HCT 31.1*  --  29.9*  PLT 300  --  257  LABPROT 13.6 14.6 16.4*  INR 1.1 1.2 1.3*  HEPARINUNFRC 0.45 0.61 1.52*  CREATININE 4.92* 5.52* 3.66*    Estimated Creatinine Clearance: 18 mL/min (A) (by C-G formula based on SCr of 3.66 mg/dL (H)).   Assessment: 45 yof admitted with atrial fibrillation and NSTEMI from [facility]. No anticoagulation PTA, CHADSVASC 5. Positive troponins on admission with a peak of 2336. Of note, she was COVID + at her ALF 11/4. Per cardiology, a cardioversion and cardiac cath may be planned in the future. Noted hx GIB in 07/2018.  Heparin level now supratherapeutic at 1.52. Drip is infusing in left-AC and appears lab draw was from the hand per RN - so should be distal enough to not effect the level. No bleeding or issues with infusion reported. Will hold for 1 hour and resume at reduced rate per protocol - communicated plan with RN.  INR slow increase so far to 1.3 after 3  doses of warfarin. LFTs wnl. CBC stable. No significant drug interactions noted.    Goal of Therapy:  Heparin level 0.3-0.7 units/ml  INR 2-3 Monitor platelets by anticoagulation protocol: Yes   Plan:  Hold heparin x 1 hour and resume at lower rate 1350 units/hr at 0900 Increase warfarin dose to 10mg  PO x 1 tonight Monitor daily heparin level/INR/CBC, s/sx bleeding   Elicia Lamp, PharmD, BCPS Please check AMION for all [place] contact numbers Clinical Pharmacist 09/20/2019 7:47 AM

## 2019-09-21 LAB — RENAL FUNCTION PANEL
Albumin: 2.8 g/dL — ABNORMAL LOW (ref 3.5–5.0)
Anion gap: 13 (ref 5–15)
BUN: 50 mg/dL — ABNORMAL HIGH (ref 8–23)
CO2: 21 mmol/L — ABNORMAL LOW (ref 22–32)
Calcium: 9.3 mg/dL (ref 8.9–10.3)
Chloride: 93 mmol/L — ABNORMAL LOW (ref 98–111)
Creatinine, Ser: 4.61 mg/dL — ABNORMAL HIGH (ref 0.44–1.00)
GFR calc Af Amer: 10 mL/min — ABNORMAL LOW (ref >60)
GFR calc non Af Amer: 9 mL/min — ABNORMAL LOW (ref >60)
Glucose, Bld: 325 mg/dL — ABNORMAL HIGH (ref 70–99)
Phosphorus: 4.9 mg/dL — ABNORMAL HIGH (ref 2.5–4.6)
Potassium: 5.2 mmol/L — ABNORMAL HIGH (ref 3.5–5.1)
Sodium: 127 mmol/L — ABNORMAL LOW (ref 135–145)

## 2019-09-21 LAB — GLUCOSE, CAPILLARY
Glucose-Capillary: 357 mg/dL — ABNORMAL HIGH (ref 70–99)
Glucose-Capillary: 350 mg/dL — ABNORMAL HIGH (ref 70–99)
Glucose-Capillary: 319 mg/dL — ABNORMAL HIGH (ref 70–99)
Glucose-Capillary: 314 mg/dL — ABNORMAL HIGH (ref 70–99)
Glucose-Capillary: 236 mg/dL — ABNORMAL HIGH (ref 70–99)
Glucose-Capillary: 252 mg/dL — ABNORMAL HIGH (ref 70–99)
Glucose-Capillary: 416 mg/dL — ABNORMAL HIGH (ref 70–99)

## 2019-09-21 LAB — CBC
HCT: 29.3 % — ABNORMAL LOW (ref 36.0–46.0)
Hemoglobin: 9.1 g/dL — ABNORMAL LOW (ref 12.0–15.0)
MCH: 29 pg (ref 26.0–34.0)
MCHC: 31.1 g/dL (ref 30.0–36.0)
MCV: 93.3 fL (ref 80.0–100.0)
Platelets: 249 10*3/uL (ref 150–400)
RBC: 3.14 MIL/uL — ABNORMAL LOW (ref 3.87–5.11)
RDW: 15.9 % — ABNORMAL HIGH (ref 11.5–15.5)
WBC: 10.4 10*3/uL (ref 4.0–10.5)
nRBC: 0 % (ref 0.0–0.2)

## 2019-09-21 LAB — PROTIME-INR
INR: 1.5 — ABNORMAL HIGH (ref 0.8–1.2)
Prothrombin Time: 17.9 seconds — ABNORMAL HIGH (ref 11.4–15.2)

## 2019-09-21 MED ORDER — HEPARIN SODIUM (PORCINE) 1000 UNIT/ML IJ SOLN
INTRAMUSCULAR | Status: AC
  Filled 2019-09-21: qty 4

## 2019-09-21 MED ORDER — MAGNESIUM CITRATE PO SOLN
0.5000 | Freq: Once | ORAL | Status: AC
  Administered 2019-09-21: 0.5 via ORAL
  Filled 2019-09-21: qty 296

## 2019-09-21 MED ORDER — HEPARIN SODIUM (PORCINE) 1000 UNIT/ML IJ SOLN
3.3000 mL | Freq: Once | INTRAMUSCULAR | Status: AC
  Administered 2019-09-21: 3300 [IU] via INTRAVENOUS

## 2019-09-21 MED ORDER — WARFARIN SODIUM 10 MG PO TABS
10.0000 mg | ORAL_TABLET | Freq: Once | ORAL | Status: AC
  Administered 2019-09-21: 10 mg via ORAL
  Filled 2019-09-21 (×2): qty 1

## 2019-09-21 MED ORDER — INSULIN GLARGINE 100 UNIT/ML ~~LOC~~ SOLN
56.0000 [IU] | Freq: Every day | SUBCUTANEOUS | Status: DC
  Administered 2019-09-21: 56 [IU] via SUBCUTANEOUS
  Filled 2019-09-21 (×3): qty 0.56

## 2019-09-21 NOTE — Progress Notes (Signed)
PROGRESS NOTE    Carolyn Valdez  GEX:[id number] DOB: [birth year] DOA: 09/11/2019 PCP: System, Provider Not In    Brief Narrative:  78 year old female with history of ESRD on hemodialysis Monday Wednesday Friday via right IJ permacath, atrial flutter, chronic diastolic heart failure, severe pulmonary hypertension, GI bleed, breast cancer in remission who was transferred from [facility] hospital per nephrology recommendations for dialysis.  She initially presented to emergency room from dialysis center where she was hypotensive and tachycardic.  Dialysis session had to be stopped early.  She was diagnosed with COVID-19 on 11/4 when she was at assisted living facility.  At the time of presentation in the ER, initial troponin elevated to 2336, global ST depression and 2-1 a flutter on EKG.  Cardiology was consulted for non-STEMI and a flutter.  Patient was started on beta-blockers and heparin infusion.  Plan was to pursue cardiac catheterization, however became hypotensive in  emergency room and transferred to [facility]. Admitted to the hospital treated for COVID-19 pneumonia, Non-STEMI and dialysis need.   Assessment & Plan:   Active Problems:   Respiratory tract infection due to COVID-19 virus  COVID-19 pneumonia: Currently without respiratory symptoms.  She did have elevated inflammatory biomarkers.  Patient finished 5 days of remdesivir therapy.  She is on Decadron, day 10/10.  Clinically improving.  NSTEMI: Patient had troponin elevation and EKG changes.  Patient was treated with heparin drip and transferred to [facility]. Cardiology following, no plans for cardiac cath and medical management advised.  They will follow up in the future to consider ischemic evaluation.  Currently on aspirin, statin and beta-blockers.   Atrial flutter with RVR: Permanent.  Heart rate ranges from 50-120. Metoprolol with holding parameters.  Patient remains on heparin protocol with Coumadin redosing.  She is  on metoprolol 75 mg twice a day.  Continue.  Continue Coumadin.  Left lower extremity cellulitis: Treated with ceftriaxone.  Clinically improved.    Hypertension: Blood pressure stable.  Type 2 diabetes with hyperglycemia: Aggravated by use of Decadron.  On increased dose of insulin.  Further increase insulin doses today.  History of seizure disorder: Continue Keppra.  No new seizures.  Physical deconditioning: Patient will benefit with inpatient PT OT before returning to assisted living facility.  Constipation: Patient has persistent constipation.  Will use 1 dose of magnesium citrate and soapsuds enema today.  Disposition discussed with patient.  She really wants to go back to [place].  I discussed with her that if [name] can take her back, we will be able to discharge her to [facility] assisted living facility, if they are not able to meet her needs she may has to go to short-term inpatient skilled nursing facility and patient is agreeable for that.   DVT prophylaxis: Coumadin. Code Status: DNR Family Communication: None Disposition Plan: ALF versus skilled nursing facility when available.  Patient is medically stable to transfer to skilled level of care.  Patient will need dialysis arrangements when she goes to SNF.   Consultants:   Nephrology  Procedures:   Dialysis  Antimicrobials:  Antibiotics Given (last 72 hours)    Date/Time Action Medication Dose Rate   09/19/19 2228 New Bag/Given   cefTRIAXone (ROCEPHIN) 2 g in sodium chloride 0.9 % 100 mL IVPB 2 g 200 mL/hr         Subjective: Patient seen and examined.  No overnight events.  Heart rate fluctuates and not needing metoprolol most of the time.  Remains afebrile.  Mostly on  room air. "It hurts and so difficult to defecate.  I need more laxatives."  Objective: Vitals:   09/21/19 0700 09/21/19 0900 09/21/19 1000 09/21/19 1100  BP: (!) 106/43 (!) 117/34 (!) 118/57 (!) 92/29  Pulse: (!) 49 (!) 57 (!) 57 (!)  51  Resp: 16 17 16 18   Temp: 98.2 F (36.8 C)     TempSrc: Oral     SpO2: 95% 96% 95% 95%  Weight:      Height:       No intake or output data in the 24 hours ending 09/21/19 1321 Filed Weights   09/12/19 1644 09/17/19 1015 09/17/19 1358  Weight: 123 kg 128.4 kg 125 kg    Examination:  General exam: Appears calm and comfortable, on 1-2  L oxygen. Not in any obvious distress. Respiratory system: Clear to auscultation. Respiratory effort normal.  Right IJ permacath present. Cardiovascular system: S1 & S2 heard, RRR. No JVD, murmurs, rubs, gallops or clicks.   Gastrointestinal system: Abdomen is nondistended, soft and nontender. No organomegaly or masses felt. Normal bowel sounds heard. Central nervous system: Alert and oriented. No focal neurological deficits. Extremities: Symmetric 5 x 5 power. Skin: No rashes, lesions or ulcers Psychiatry: Judgement and insight appear normal. Mood & affect flat.    Data Reviewed: I have personally reviewed following labs and imaging studies  CBC: Recent Labs  Lab 09/15/19 0615 09/16/19 0424 09/18/19 0322 09/20/19 0444 09/21/19 0135  WBC 11.5* 12.3* 11.2* 11.1* 10.4  NEUTROABS 8.9* 9.7*  --   --   --   HGB 9.3* 9.9* 9.7* 9.7* 9.1*  HCT 30.0* 31.6* 31.1* 29.9* 29.3*  MCV 92.0 92.4 92.3 89.8 93.3  PLT 397 398 300 257 102   Basic Metabolic Panel: Recent Labs  Lab 09/15/19 0615 09/16/19 0424 09/17/19 0540 09/18/19 0322 09/19/19 0422 09/20/19 0444 09/21/19 0135  NA 130* 129* 128* 131* 127* 130* 127*  K 4.2 5.2* 5.0 4.8 4.9 4.8 5.2*  CL 93* 93* 93* 94* 92* 94* 93*  CO2 24 22 23  19* 21* 25 21*  GLUCOSE 353* 427* 281* 282* 294* 311* 325*  BUN 27* 44* 62* 48* 68* 35* 50*  CREATININE 5.08* 5.92* 6.94* 4.92* 5.52* 3.66* 4.61*  CALCIUM 8.8* 9.2 8.9 8.5* 9.2 8.3* 9.3  MG 1.9 2.0  --  1.9  --   --   --   PHOS 3.0 4.7* 4.5 3.8 4.5 3.9 4.9*   GFR: Estimated Creatinine Clearance: 14.3 mL/min (A) (by C-G formula based on SCr of 4.61  mg/dL (H)). Liver Function Tests: Recent Labs  Lab 09/15/19 0615 09/16/19 0424 09/17/19 0540 09/18/19 0322 09/19/19 0422 09/20/19 0444 09/21/19 0135  AST 17 17  --   --   --   --   --   ALT 12 14  --   --   --   --   --   ALKPHOS 73 70  --   --   --   --   --   BILITOT 0.8 0.6  --   --   --   --   --   PROT 6.3* 6.5  --   --   --   --   --   ALBUMIN 2.9* 3.0* 2.7* 2.8* 2.7* 2.7* 2.8*   No results for input(s): LIPASE, AMYLASE in the last 168 hours. No results for input(s): AMMONIA in the last 168 hours. Coagulation Profile: Recent Labs  Lab 09/18/19 0322 09/19/19 0422 09/20/19 0444 09/21/19 0135  INR 1.1 1.2 1.3* 1.5*   Cardiac Enzymes: No results for input(s): CKTOTAL, CKMB, CKMBINDEX, TROPONINI in the last 168 hours. BNP (last 3 results) No results for input(s): PROBNP in the last 8760 hours. HbA1C: No results for input(s): HGBA1C in the last 72 hours. CBG: Recent Labs  Lab 09/20/19 1616 09/20/19 2047 09/21/19 0358 09/21/19 0750 09/21/19 1233  GLUCAP 357* 350* 319* 314* 236*   Lipid Profile: No results for input(s): CHOL, HDL, LDLCALC, TRIG, CHOLHDL, LDLDIRECT in the last 72 hours. Thyroid Function Tests: No results for input(s): TSH, T4TOTAL, FREET4, T3FREE, THYROIDAB in the last 72 hours. Anemia Panel: No results for input(s): VITAMINB12, FOLATE, FERRITIN, TIBC, IRON, RETICCTPCT in the last 72 hours. Sepsis Labs: No results for input(s): PROCALCITON, LATICACIDVEN in the last 168 hours.  Recent Results (from the past 240 hour(s))  MRSA PCR Screening     Status: None   Collection Time: 09/11/19  9:01 PM   Specimen: Nasopharyngeal  Result Value Ref Range Status   MRSA by PCR NEGATIVE NEGATIVE Final    Comment:        The GeneXpert MRSA Assay (FDA approved for NASAL specimens only), is one component of a comprehensive MRSA colonization surveillance program. It is not intended to diagnose MRSA infection nor to guide or monitor treatment for MRSA  infections. Performed at [facility], [place] [address] [place], [place] [address]          Radiology Studies: No results found.      Scheduled Meds: . heparin      . atorvastatin  40 mg Oral q1800  . calcium acetate  1,334 mg Oral TID AC  . Chlorhexidine Gluconate Cloth  6 each Topical Q0600  . citalopram  10 mg Oral Daily  . dexamethasone  6 mg Oral Daily  . docusate sodium  100 mg Oral BID  . heparin  3.3 mL Intravenous Once  . insulin aspart  0-15 Units Subcutaneous TID AC & HS  . insulin aspart  10 Units Subcutaneous TID WC  . insulin glargine  56 Units Subcutaneous QHS  . lamoTRIgine  25 mg Oral Daily  . levETIRAcetam  250 mg Oral BID  . mouth rinse  15 mL Mouth Rinse BID  . metoprolol tartrate  75 mg Oral BID  . mirabegron ER  25 mg Oral Daily  . OLANZapine  5 mg Oral QHS  . pantoprazole  40 mg Oral Daily  . senna  1 tablet Oral BID  . sodium chloride flush  3 mL Intravenous Q12H  . torsemide  40 mg Oral Once per day on Sun Tue Thu Sat  . vitamin C  500 mg Oral Daily  . warfarin  10 mg Oral ONCE-1800  . Warfarin - Pharmacist Dosing Inpatient   Does not apply q1800  . zinc sulfate  220 mg Oral Daily   Continuous Infusions:    LOS: 10 days    Time spent: 25 minutes    Barb Merino, MD [facility] Pager [phone]

## 2019-09-21 NOTE — Progress Notes (Signed)
Inpatient Diabetes Program Recommendations  AACE/ADA: New Consensus Statement on Inpatient Glycemic Control (2015)  Target Ranges:  Prepandial:   less than 140 mg/dL      Peak postprandial:   less than 180 mg/dL (1-2 hours)      Critically ill patients:  140 - 180 mg/dL   Lab Results  Component Value Date   GLUCAP 314 (H) 09/21/2019   HGBA1C 7.9 (H) 09/10/2019    Review of Glycemic Control Results for Carolyn Valdez, Carolyn Valdez (MRN [id number]) as of 09/21/2019 11:09  Ref. Range 09/20/2019 11:37 09/20/2019 16:16 09/20/2019 20:47 09/21/2019 03:58 09/21/2019 07:50  Glucose-Capillary Latest Ref Range: 70 - 99 mg/dL 249 (H) 357 (H) 350 (H) 319 (H) 314 (H)   Diabetes history: DM 2 Outpatient Diabetes medications:  Glucotrol 5 mg daily, Novolog tid with meals (0-5 units), Lantus 45 units q HS Current orders for Inpatient glycemic control:  Novolog moderate tid with meals and HS Decadron 6 mg daily Lantus 50 units q HS, Novolog 10 units tid with meals  Inpatient Diabetes Program Recommendations:   Consider further increase of Lantus to 56 units q HS.   Thanks,  Adah Perl, RN, BC-ADM Inpatient Diabetes Coordinator Pager [phone] (8a-5p)

## 2019-09-21 NOTE — Progress Notes (Signed)
NEW ADMISSION NOTE New Admission Note: patient transferred from 48M  Arrival Method: Brought pt from 6N 32 in HD with the help of 2 staff. Mental Orientation: Alert and oriented  Telemetry: 95m11 Assessment: Completed Skin: stage 1 sacrum IV: left FA and Left AC SL Pain: DENIES ANY PAIN Tubes:  [place] Safety Measures: Safety Fall Prevention Plan has been given, discussed and signed Admission: Completed [facility] Orientation: Patient has been orientated to the room, unit and staff.   Orders have been reviewed and implemented. Will continue to monitor the patient. Call light has been placed within reach and bed alarm has been activated.   Amaryllis Dyke, RN

## 2019-09-21 NOTE — Discharge Instructions (Signed)

## 2019-09-21 NOTE — Progress Notes (Addendum)
12pm: CSW spoke with Lattie Haw at [facility] who is requesting clinical information for this patient. CSW sent clinical information over for review. CSW emphasized the importance of discharging this patient as soon as possible due to high census.  10am: CSW made additional attempts to reach admissions staff at [place], [place] left voicemail with business office requesting return call.  7:30am: CSW attempted to reach admissions staff at [facility], receptionist stated they do not arrive until 9am.  CSW spoke with Ronalee Belts, clinical liasion who provided CSW with contact information for Deatra Ina the admissions coordinator at [place]. Lisa's number is [phone].  CSW placed call to Lattie Haw, no voicemail option available. CSW will continue attempting to reach her.  Madilyn Fireman, MSW, LCSW-A Transitions of Care  Clinical Social Worker  [facility] Emergency Departments  Medical ICU [phone]

## 2019-09-21 NOTE — Progress Notes (Signed)
Patient's CBG is 416. Notified Dr. Hartford Poli. Orders received.

## 2019-09-21 NOTE — Progress Notes (Signed)
ID PROGRESS NOTE  Dr Dante Gang asked me to weigh in on airborne/contact isolation for Carolyn Valdez.  This is a 41yoF with ESRD, CHF, t2DM, who had originally tested positive on 11/4 at [facility] but then admitted on 11/23 for shortness of breath found to have afib/aflutter, NSTEMI and treated for cellulitis. She has been on airborne/contact since her admission. She is now > 21 days since onset of positive test and has had some improvement of her original symptoms and remains afebrile.  A/P:  She may be discontinued from her airborne/contact isolation. Per cdc and [facility] covid isolation policy  Do not recommend to do repeat covid PCR testing since viral fragments can persist for 2-3 months  Defer to SNF review if that is sufficient, especially if she was known to be positive prior to admission from [facility]   Carolyn Valdez B. [place] [facility] Infectious [facility] [phone]

## 2019-09-21 NOTE — Evaluation (Addendum)
Occupational Therapy Evaluation Patient Details Name: Carolyn Valdez MRN: [id number] DOB: Aug 03, 1941 Today's Date: 09/21/2019    History of Present Illness 78 y.o. female with medical history significant of ESRD on dialysis, chronic diastolic CHF, pulmonary hypertension, type 2 diabetes, GI bleed in Oct 2019, HTN, HLD, DJD, HFpEF, breast cancer, chronic lymphedema, obesity  anxiety/depression.  Chronically on 3L/min O2.  Presented to the ED 11/23 from  dialysis with dyspnea and dizziness. Pt tested COVID + at her ALF [name]).  CXR showed stable cardiomegaly with mild vascular congestion. ECG showed atrial flutter 2-1 AV condution, nonspecific ST and T wave changes.     Clinical Impression   PTA pt living at ALF, used RW and w/c with assist in dressing and bathing. At time of eval, pt completed sit <> stand transfer (from bed in chair position) with mod A +2. Pt then took lateral steps to recliner. During this time she c/o of rectal pain. Education given on importance of mobility and sitting upright for improved digestion. Pt able to complete grooming with set up assist, and UB bathing with min A. Pt self limiting at times in mobility and BADL. At this time, recommending SNF at d/c for continued rehab prior to returning to ALF. Will continue to follow per POC listed below.    Follow Up Recommendations  SNF;Supervision/Assistance - 24 hour    Equipment Recommendations  None recommended by OT    Recommendations for Other Services       Precautions / Restrictions Precautions Precautions: Fall Restrictions Weight Bearing Restrictions: No      Mobility Bed Mobility Overal bed mobility: Needs Assistance Bed Mobility: Supine to Sit;Sit to Supine     Supine to sit: Mod assist;HOB elevated;+2 for physical assistance     General bed mobility comments: bed placed in chair position for pt to stand from end  Transfers Overall transfer level: Needs assistance Equipment used: Rolling walker  (2 wheeled) Transfers: Sit to/from Stand Sit to Stand: Mod assist;+2 physical assistance;+2 safety/equipment   Squat pivot transfers: Total assist     General transfer comment: mod A +2 with RW for standing and steadying. Pt needing increased assist to get hips back in chair    Balance Overall balance assessment: Needs assistance Sitting-balance support: Feet supported;No upper extremity supported;Single extremity supported;Bilateral upper extremity supported Sitting balance-Leahy Scale: Fair     Standing balance support: During functional activity;Bilateral upper extremity supported Standing balance-Leahy Scale: Poor Standing balance comment: requires bilateral UE support to maintain upright                           ADL either performed or assessed with clinical judgement   ADL Overall ADL's : Needs assistance/impaired Eating/Feeding: Set up;Sitting   Grooming: Set up;Sitting;Wash/dry face;Brushing hair   Upper Body Bathing: Minimal assistance;Sitting Upper Body Bathing Details (indicate cue type and reason): assist for back Lower Body Bathing: Sitting/lateral leans;Sit to/from stand;Total assistance   Upper Body Dressing : Minimal assistance;Sitting   Lower Body Dressing: Total assistance;Sitting/lateral leans;Sit to/from stand   Toilet Transfer: Moderate assistance;+2 for physical assistance;RW Toilet Transfer Details (indicate cue type and reason): simulated with bed <> chair t/f Toileting- Clothing Manipulation and Hygiene: Total assistance;Sit to/from stand Toileting - Clothing Manipulation Details (indicate cue type and reason): to clean peri area     Functional mobility during ADLs: Moderate assistance;+2 for physical assistance;+2 for safety/equipment;Rolling walker(sit <> stand only) General ADL Comments: pt limited by generalized weakness  and rectal pain     Vision Baseline Vision/History: Wears glasses Wears Glasses: At all times Patient Visual  Report: No change from baseline       Perception     Praxis      Pertinent Vitals/Pain Pain Assessment: Faces Faces Pain Scale: Hurts even more Pain Location: rectum and LB, increased to 10/10 pain with sitting in recliner Pain Descriptors / Indicators: Sore;Aching;Throbbing Pain Intervention(s): Limited activity within patient's tolerance;Monitored during session;Repositioned     Hand Dominance     Extremity/Trunk Assessment Upper Extremity Assessment Upper Extremity Assessment: Generalized weakness   Lower Extremity Assessment Lower Extremity Assessment: Defer to PT evaluation RLE Deficits / Details: ROM limited by LE edema and body habitus, strength grossly 3+/5 RLE Sensation: history of peripheral neuropathy(decreased sensation from mid calf down) LLE Deficits / Details: ROM limited by LE edema and body habitus, strength grossly 3+/5 LLE Sensation: history of peripheral neuropathy(decreased sensation from mid calf down)       Communication Communication Communication: No difficulties   Cognition Arousal/Alertness: Awake/alert Behavior During Therapy: WFL for tasks assessed/performed Overall Cognitive Status: Within Functional Limits for tasks assessed                                     General Comments       Exercises     Shoulder Instructions      Home Living Family/patient expects to be discharged to:: Assisted living                             Home Equipment: Walker - 2 wheels;Wheelchair - Brewing technologist - built in   Additional Comments: Resident at [facility]F      Prior Functioning/Environment Level of Independence: Needs assistance  Gait / Transfers Assistance Needed: baseline AMB from recliner to BR with RW; WC for facility distances but unable to self propel ADL's / Homemaking Assistance Needed: MaxA for bathing/dressing; unable to wipe herself posteriorly when toiletting            OT Problem List:  Decreased strength;Decreased knowledge of use of DME or AE;Obesity;Decreased activity tolerance;Cardiopulmonary status limiting activity;Impaired balance (sitting and/or standing);Pain      OT Treatment/Interventions: Self-care/ADL training;Therapeutic exercise;Patient/family education;Balance training;Therapeutic activities;DME and/or AE instruction;Energy conservation    OT Goals(Current goals can be found in the care plan section) Acute Rehab OT Goals Patient Stated Goal: go back to ALF OT Goal Formulation: With patient Time For Goal Achievement: 10/05/19  OT Frequency: Min 2X/week   Barriers to D/C:            Co-evaluation PT/OT/SLP Co-Evaluation/Treatment: Yes Reason for Co-Treatment: For patient/therapist safety PT goals addressed during session: Mobility/safety with mobility OT goals addressed during session: ADL's and self-care;Strengthening/ROM      AM-PAC OT "6 Clicks" Daily Activity     Outcome Measure Help from another person eating meals?: A Little Help from another person taking care of personal grooming?: A Little Help from another person toileting, which includes using toliet, bedpan, or urinal?: A Lot Help from another person bathing (including washing, rinsing, drying)?: A Lot Help from another person to put on and taking off regular upper body clothing?: A Little Help from another person to put on and taking off regular lower body clothing?: A Lot 6 Click Score: 15   End of Session Equipment Utilized During Treatment: Gait belt;Rolling  walker Nurse Communication: Mobility status;Need for lift equipment  Activity Tolerance: Patient tolerated treatment well Patient left: in chair;with call bell/phone within reach  OT Visit Diagnosis: Unsteadiness on feet (R26.81);Other abnormalities of gait and mobility (R26.89);Muscle weakness (generalized) (M62.81);Pain Pain - part of body: (rectum)                Time: 1100-1203 OT Time Calculation (min): 63  min Charges:  OT General Charges $OT Visit: 1 Visit OT Evaluation $OT Eval Moderate Complexity: 1 Mod OT Treatments $Self Care/Home Management : 8-22 mins  Zenovia Jarred, MSOT, OTR/L Behavioral Health OT/ Acute Relief OT [facility] Office: [phone]   Zenovia Jarred 09/21/2019, 3:01 PM

## 2019-09-21 NOTE — Progress Notes (Signed)
ANTICOAGULATION CONSULT NOTE - Follow Up Consult  Pharmacy Consult for warfarin Indication: chest pain/ACS and atrial fibrillation  Allergies  Allergen Reactions  . Sulfa Antibiotics Hives  . Biaxin [Clarithromycin] Hives  . Buspar [Buspirone]   . Influenza A (H1n1) Monoval Vac Other (See Comments)    Pt states that she was told by her MD not to get the influenza vaccine.    . Morphine Other (See Comments)    Reaction:  Dizziness and confusion   . Pyridium [Phenazopyridine Hcl] Other (See Comments)    Reaction:  Unknown   . Tuberculin Tests   . Ceftriaxone Anxiety  . Latex Rash  . Prednisone Rash  . Tape Rash    Patient Measurements: Height: 5\' 7"  (170.2 cm) Weight: 275 lb 9.2 oz (125 kg) IBW/kg (Calculated) : 61.6 Heparin Dosing Weight: 91 kg  Vital Signs: Temp: 98.2 F (36.8 C) (12/04 0700) Temp Source: Oral (12/04 0700) BP: 106/43 (12/04 0700) Pulse Rate: 49 (12/04 0700)  Labs: Recent Labs    09/19/19 0422 09/20/19 0444 09/21/19 0135  HGB  --  9.7* 9.1*  HCT  --  29.9* 29.3*  PLT  --  257 249  LABPROT 14.6 16.4* 17.9*  INR 1.2 1.3* 1.5*  HEPARINUNFRC 0.61 1.52*  --   CREATININE 5.52* 3.66* 4.61*    Estimated Creatinine Clearance: 14.3 mL/min (A) (by C-G formula based on SCr of 4.61 mg/dL (H)).   Assessment: 43 yof admitted with atrial fibrillation and NSTEMI from [facility]. No anticoagulation PTA, CHADSVASC 5. Positive troponins on admission with a peak of 2336. Of note, she was COVID + at her ALF 11/4. Noted hx GIB in 07/2018.  Heparin d/c'd on 12/3 per [facility]. INR slow increase so far to 1.5 after 4 doses of warfarin. LFTs wnl. CBC stable. No significant drug interactions noted.    Goal of Therapy:  INR 2-3 Monitor platelets by anticoagulation protocol: Yes   Plan:  Warfarin 10mg  PO x 1 again tonight Monitor daily INR, CBC, s/sx bleeding   Elicia Lamp, PharmD, BCPS Please check AMION for all [place] contact numbers Clinical  Pharmacist 09/21/2019 10:06 AM

## 2019-09-21 NOTE — Progress Notes (Signed)
Patient ID: Carolyn Valdez, female   DOB: [birth year], 78 y.o.   MRN: [id number] [facility] Progress Note   Assessment/ Plan:   1.  COVID-19 infection: With significant improvement of respiratory status status post remdesivir and Decadron and currently feeling back to baseline.  Course complicated by development of non-ST elevation MI managed medically.  Now working on disposition. 2. ESRD: We will continue hemodialysis on a Monday/Wednesday/Friday schedule pending placement initiation/scheduling.  She remains on torsemide on nondialysis days.  She appears euvolemic on exam and needs continued efforts at restricting interdialytic weight gain/fluid intake. 3. Anemia: Status post ESA, no overt blood loss.  Monitor with hemodialysis/UF. 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 5.  Atrial flutter/atrial fibrillation with RVR: Currently appears to be rate controlled and borderline bradycardic on metoprolol. 6. Hypertension: Blood pressure under acceptable control, continue to monitor with hemodialysis/current medications.  Subjective:   Working on disposition, expresses disappointment that she cannot go back to [facility].   Objective:   BP (!) 100/31   Pulse (!) 53   Temp 98.1 F (36.7 C) (Axillary)   Resp 16   Ht 5\' 7"  (1.702 m)   Wt 125 kg   SpO2 95%   BMI 43.16 kg/m   Physical Exam: Gen: Comfortably resting in bed, watching television CVS: Pulse regular bradycardia, S1 and S2 normal Resp: Diminished breath sounds over bases, poor inspiratory effort.  No rales/rhonchi.  Right IJ TDC Abd: Soft, obese, nontender Ext: Trace lower extremity edema  Labs: BMET Recent Labs  Lab 09/15/19 0615 09/16/19 0424 09/17/19 0540 09/18/19 0322 09/19/19 0422 09/20/19 0444 09/21/19 0135  NA 130* 129* 128* 131* 127* 130* 127*  K 4.2 5.2* 5.0 4.8 4.9 4.8 5.2*  CL 93* 93* 93* 94* 92* 94* 93*  CO2 24 22 23  19* 21* 25  21*  GLUCOSE 353* 427* 281* 282* 294* 311* 325*  BUN 27* 44* 62* 48* 68* 35* 50*  CREATININE 5.08* 5.92* 6.94* 4.92* 5.52* 3.66* 4.61*  CALCIUM 8.8* 9.2 8.9 8.5* 9.2 8.3* 9.3  PHOS 3.0 4.7* 4.5 3.8 4.5 3.9 4.9*   CBC Recent Labs  Lab 09/14/19 0823 09/15/19 0615 09/16/19 0424 09/18/19 0322 09/20/19 0444 09/21/19 0135  WBC 8.6 11.5* 12.3* 11.2* 11.1* 10.4  NEUTROABS 6.5 8.9* 9.7*  --   --   --   HGB 9.4* 9.3* 9.9* 9.7* 9.7* 9.1*  HCT 30.4* 30.0* 31.6* 31.1* 29.9* 29.3*  MCV 93.5 92.0 92.4 92.3 89.8 93.3  PLT 414* 397 398 300 257 249   Medications:    . atorvastatin  40 mg Oral q1800  . calcium acetate  1,334 mg Oral TID AC  . Chlorhexidine Gluconate Cloth  6 each Topical Q0600  . citalopram  10 mg Oral Daily  . dexamethasone  6 mg Oral Daily  . docusate sodium  100 mg Oral BID  . insulin aspart  0-15 Units Subcutaneous TID AC & HS  . insulin aspart  10 Units Subcutaneous TID WC  . insulin glargine  50 Units Subcutaneous QHS  . lamoTRIgine  25 mg Oral Daily  . levETIRAcetam  250 mg Oral BID  . mouth rinse  15 mL Mouth Rinse BID  . metoprolol tartrate  75 mg Oral BID  . mirabegron ER  25 mg Oral Daily  . OLANZapine  5 mg Oral QHS  . pantoprazole  40 mg Oral Daily  . senna  1 tablet Oral BID  .  sodium chloride flush  3 mL Intravenous Q12H  . torsemide  40 mg Oral Once per day on Sun Tue Thu Sat  . vitamin C  500 mg Oral Daily  . Warfarin - Pharmacist Dosing Inpatient   Does not apply q1800  . zinc sulfate  220 mg Oral Daily   Elmarie Shiley, MD 09/21/2019, 7:26 AM

## 2019-09-21 NOTE — Progress Notes (Signed)
Physical Therapy Treatment Patient Details Name: Carolyn Valdez MRN: [id number] DOB: [birth year] Today's Date: 09/21/2019    History of Present Illness 78 y.o. female with medical history significant of ESRD on dialysis, chronic diastolic CHF, pulmonary hypertension, type 2 diabetes, GI bleed in Oct 2019, HTN, HLD, DJD, HFpEF, breast cancer, chronic lymphedema, obesity  anxiety/depression.  Chronically on 3L/min O2.  Presented to the ED 11/23 from  dialysis with dyspnea and dizziness. Pt tested COVID + at her [facility] Carolyn [facility]).  CXR showed stable cardiomegaly with mild vascular congestion. ECG showed atrial flutter 2-1 AV condution, nonspecific ST and T wave changes.      PT Comments    Pt request a nap before participating in therapy and then states "I got up yesterday." Pt reminded it had been 2 day since she was seen by therapy. Pt is limited in safe mobility by increased pain in her rectum from constipation in presence of decreased strength and endurance. To reduce shear on her bottom and decreased effort to exit bed, bed placed in egress position and pt transfers able to come into static sitting with modAx2 and requires maxAx2 for power up to RW. Pt able to take laterals steps from bed to recliner placed parallel to the bed with modAx2. Pt able to stand 2x more from recliner with modAx2 for pericare. D/c plans remain appropriate. PT will continue to follow acutely.     Follow Up Recommendations  SNF     Equipment Recommendations  None recommended by PT       Precautions / Restrictions Precautions Precautions: Fall Restrictions Weight Bearing Restrictions: No    Mobility  Bed Mobility Overal bed mobility: Needs Assistance Bed Mobility: Supine to Sit;Sit to Supine     Supine to sit: Mod assist;HOB elevated;+2 for physical assistance     General bed mobility comments: bed placed in chair position for pt to stand from end  Transfers Overall transfer level: Needs assistance Equipment  used: Rolling walker (2 wheeled) Transfers: Sit to/from Stand Sit to Stand: Mod assist;+2 physical assistance;+2 safety/equipment   Squat pivot transfers: Total assist     General transfer comment: mod A +2 with RW for standing and steadying. Pt needing increased assist to get hips back in chair  Ambulation/Gait Ambulation/Gait assistance: Mod assist Gait Distance (Feet): 3 Feet Assistive device: Rolling walker (2 wheeled) Gait Pattern/deviations: Step-to pattern;Decreased step length - left;Shuffle;Decreased dorsiflexion - right;Decreased dorsiflexion - left;Trunk flexed Gait velocity: slowed Gait velocity interpretation: <1.8 ft/sec, indicate of risk for recurrent falls General Gait Details: modAx2 for slow shuffling lateral steps to recliner parallel to bed       Balance Overall balance assessment: Needs assistance Sitting-balance support: Feet supported;No upper extremity supported;Single extremity supported;Bilateral upper extremity supported Sitting balance-Leahy Scale: Fair     Standing balance support: During functional activity;Bilateral upper extremity supported Standing balance-Leahy Scale: Poor Standing balance comment: requires bilateral UE support to maintain upright                            Cognition Arousal/Alertness: Awake/alert Behavior During Therapy: WFL for tasks assessed/performed Overall Cognitive Status: Within Functional Limits for tasks assessed                                           General Comments General comments (skin integrity, edema, etc.): VSS on  2L O2 via [place]      Pertinent Vitals/Pain Pain Assessment: Faces Faces Pain Scale: Hurts even more Pain Location: rectum and LB, increased to 10/10 pain with sitting in recliner Pain Descriptors / Indicators: Sore;Aching;Throbbing Pain Intervention(s): Limited activity within patient's tolerance;Monitored during session;Repositioned    Home Living  Family/patient expects to be discharged to:: Assisted living             Home Equipment: Walker - 2 wheels;Wheelchair - Brewing technologist - built in Additional Comments: Resident at [facility]    Prior Function Level of Independence: Needs assistance  Gait / Transfers Assistance Needed: baseline AMB from recliner to BR with RW; WC for facility distances but unable to self propel ADL's / Homemaking Assistance Needed: MaxA for bathing/dressing; unable to wipe herself posteriorly when toiletting     PT Goals (current goals can now be found in the care plan section) Acute Rehab PT Goals Patient Stated Goal: go back to [facility] PT Goal Formulation: With patient Time For Goal Achievement: 10/03/19 Potential to Achieve Goals: Fair Progress towards PT goals: Progressing toward goals    Frequency    Min 2X/week      PT Plan Current plan remains appropriate    Co-evaluation PT/OT/SLP Co-Evaluation/Treatment: Yes Reason for Co-Treatment: For patient/therapist safety PT goals addressed during session: Mobility/safety with mobility OT goals addressed during session: ADL's and self-care;Strengthening/ROM      AM-PAC PT "6 Clicks" Mobility   Outcome Measure  Help needed turning from your back to your side while in a flat bed without using bedrails?: A Little Help needed moving from lying on your back to sitting on the side of a flat bed without using bedrails?: A Lot Help needed moving to and from a bed to a chair (including a wheelchair)?: Total Help needed standing up from a chair using your arms (e.g., wheelchair or bedside chair)?: A Lot Help needed to walk in hospital room?: Total Help needed climbing 3-5 steps with a railing? : Total 6 Click Score: 10    End of Session Equipment Utilized During Treatment: Oxygen;Gait belt Activity Tolerance: Patient limited by pain;Patient limited by fatigue Patient left: in bed;with call bell/phone within reach;with bed alarm set Nurse  Communication: Mobility status PT Visit Diagnosis: Unsteadiness on feet (R26.81);Other abnormalities of gait and mobility (R26.89);Muscle weakness (generalized) (M62.81);Difficulty in walking, not elsewhere classified (R26.2);Dizziness and giddiness (R42);Pain Pain - part of body: (rectum and low back)     Time: 1100-1203 PT Time Calculation (min) (ACUTE ONLY): 63 min  Charges:  $Gait Training: 8-22 mins $Therapeutic Activity: 8-22 mins                     Shelvia Fojtik B. Migdalia Dk PT, DPT Acute Rehabilitation Services Pager [phone] Office [phone]    [place] 09/21/2019, 3:03 PM

## 2019-09-22 LAB — RENAL FUNCTION PANEL
Albumin: 3 g/dL — ABNORMAL LOW (ref 3.5–5.0)
Anion gap: 15 (ref 5–15)
BUN: 38 mg/dL — ABNORMAL HIGH (ref 8–23)
CO2: 23 mmol/L (ref 22–32)
Calcium: 8.9 mg/dL (ref 8.9–10.3)
Chloride: 92 mmol/L — ABNORMAL LOW (ref 98–111)
Creatinine, Ser: 3.53 mg/dL — ABNORMAL HIGH (ref 0.44–1.00)
GFR calc Af Amer: 14 mL/min — ABNORMAL LOW (ref >60)
GFR calc non Af Amer: 12 mL/min — ABNORMAL LOW (ref >60)
Glucose, Bld: 245 mg/dL — ABNORMAL HIGH (ref 70–99)
Phosphorus: 3.5 mg/dL (ref 2.5–4.6)
Potassium: 4.3 mmol/L (ref 3.5–5.1)
Sodium: 130 mmol/L — ABNORMAL LOW (ref 135–145)

## 2019-09-22 LAB — GLUCOSE, CAPILLARY
Glucose-Capillary: 229 mg/dL — ABNORMAL HIGH (ref 70–99)
Glucose-Capillary: 219 mg/dL — ABNORMAL HIGH (ref 70–99)
Glucose-Capillary: 142 mg/dL — ABNORMAL HIGH (ref 70–99)
Glucose-Capillary: 122 mg/dL — ABNORMAL HIGH (ref 70–99)

## 2019-09-22 LAB — PROTIME-INR
INR: 1.9 — ABNORMAL HIGH (ref 0.8–1.2)
Prothrombin Time: 21.2 seconds — ABNORMAL HIGH (ref 11.4–15.2)

## 2019-09-22 MED ORDER — WARFARIN SODIUM 7.5 MG PO TABS
7.5000 mg | ORAL_TABLET | Freq: Once | ORAL | Status: AC
  Administered 2019-09-22: 7.5 mg via ORAL
  Filled 2019-09-22: qty 1

## 2019-09-22 MED ORDER — MAGNESIUM CITRATE PO SOLN
1.0000 | Freq: Once | ORAL | Status: DC
  Administered 2019-09-22: 1 via ORAL
  Filled 2019-09-22: qty 296

## 2019-09-22 MED ORDER — INSULIN GLARGINE 100 UNIT/ML ~~LOC~~ SOLN
22.0000 [IU] | Freq: Every day | SUBCUTANEOUS | Status: DC
  Administered 2019-09-22 – 2019-09-26 (×5): 22 [IU] via SUBCUTANEOUS
  Filled 2019-09-22 (×6): qty 0.22

## 2019-09-22 NOTE — Progress Notes (Signed)
Pt HR sustaining 120-130s ST. VS 99.2-128-16-126/68. O2 saturation 99% on [place] 2L. Pt denies pain or SOB. Dr. Hartford Poli notified. Will continue to monitor.

## 2019-09-22 NOTE — Progress Notes (Signed)
Patient ID: Carolyn Valdez, female   DOB: 1941/02/20, 78 y.o.   MRN: [id number] [facility] Progress Note   Assessment/ Plan:   1.  COVID-19 infection: Respiratory status back to baseline following remdesivir/corticosteroids.  Course complicated by non-STEMI that is ongoing medical management. 2. ESRD: We will continue hemodialysis on a Monday/Wednesday/Friday schedule pending placement initiation/scheduling.  Close to euvolemic on physical exam getting torsemide on nondialysis days.  Discussed fluid restriction. 3. Anemia: Status post ESA, no overt blood loss.  Monitor with hemodialysis/UF. 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 5.  Atrial flutter/atrial fibrillation with RVR: With RVR noted overnight, continue beta-blocker. 6. Hypertension: Blood pressure under acceptable control, continue to monitor with hemodialysis/current medications. 7.  Rectal/anal pain: Management per primary service, avoid magnesium citrate/phosphate-based enemas.  May benefit from Xylocaine suppository.  Subjective:   Intermittent tachycardia noted overnight-patient asymptomatic.  She complains of rectal pain/discomfort yesterday.   Objective:   BP (!) 137/49 (BP Location: Left Arm)   Pulse 64   Temp 99.1 F (37.3 C) (Oral)   Resp 18   Ht 5\' 7"  (1.702 m)   Wt 117.3 kg   SpO2 98%   BMI 40.50 kg/m   Physical Exam: Gen: Appears to be somewhat uncomfortable resting in bed (reports rectal spasms) CVS: Pulse regular bradycardia, S1 and S2 normal Resp: Diminished breath sounds over bases, poor inspiratory effort.  No rales/rhonchi.  Right IJ TDC Abd: Soft, obese, nontender Ext: Trace lower extremity edema  Labs: BMET Recent Labs  Lab 09/16/19 0424 09/17/19 0540 09/18/19 0322 09/19/19 0422 09/20/19 0444 09/21/19 0135 09/22/19 0431  NA 129* 128* 131* 127* 130* 127* 130*  K 5.2* 5.0 4.8 4.9 4.8 5.2* 4.3  CL 93* 93* 94* 92*  94* 93* 92*  CO2 22 23 19* 21* 25 21* 23  GLUCOSE 427* 281* 282* 294* 311* 325* 245*  BUN 44* 62* 48* 68* 35* 50* 38*  CREATININE 5.92* 6.94* 4.92* 5.52* 3.66* 4.61* 3.53*  CALCIUM 9.2 8.9 8.5* 9.2 8.3* 9.3 8.9  PHOS 4.7* 4.5 3.8 4.5 3.9 4.9* 3.5   CBC Recent Labs  Lab 09/16/19 0424 09/18/19 0322 09/20/19 0444 09/21/19 0135  WBC 12.3* 11.2* 11.1* 10.4  NEUTROABS 9.7*  --   --   --   HGB 9.9* 9.7* 9.7* 9.1*  HCT 31.6* 31.1* 29.9* 29.3*  MCV 92.4 92.3 89.8 93.3  PLT 398 300 257 249   Medications:    . atorvastatin  40 mg Oral q1800  . calcium acetate  1,334 mg Oral TID AC  . Chlorhexidine Gluconate Cloth  6 each Topical Q0600  . citalopram  10 mg Oral Daily  . docusate sodium  100 mg Oral BID  . insulin aspart  0-15 Units Subcutaneous TID AC & HS  . insulin aspart  10 Units Subcutaneous TID WC  . insulin glargine  56 Units Subcutaneous QHS  . lamoTRIgine  25 mg Oral Daily  . levETIRAcetam  250 mg Oral BID  . magnesium citrate  1 Bottle Oral Once  . mouth rinse  15 mL Mouth Rinse BID  . metoprolol tartrate  75 mg Oral BID  . mirabegron ER  25 mg Oral Daily  . OLANZapine  5 mg Oral QHS  . pantoprazole  40 mg Oral Daily  . senna  1 tablet Oral BID  . sodium chloride flush  3 mL Intravenous Q12H  . torsemide  40 mg Oral Once per  day on Sun Tue Thu Sat  . vitamin C  500 mg Oral Daily  . Warfarin - Pharmacist Dosing Inpatient   Does not apply q1800  . zinc sulfate  220 mg Oral Daily   Elmarie Shiley, MD 09/22/2019, 9:58 AM

## 2019-09-22 NOTE — TOC Initial Note (Signed)
Transition of Care [facility]) - Initial/Assessment Note    Patient Details  Name: Carolyn Valdez MRN: [id number] Date of Birth: 18-May-1941  Transition of Care [facility]) CM/SW Contact:    Vinie Sill, [place] Phone Number: 09/22/2019, 1:09 PM  Clinical Narrative:                  CSW visit with the patient at bedside. CSW introduced self and explained role. Patient confirmed she lives at [facility] in [place]. CSW discussed PT recommendation of ST rehab at [facility]. Patient is agreeable to ST rehab at [facility]. Patient states no preference but wanted referrals send in the [facility]. Patient states no questions or concerns at this time.   CSW faxed out referrals- CSW will provide bed offers once available. CSW will continue to follow and assist with discharge planning.  Thurmond Butts, MSW, [facility] Clinical Social Worker [phone]   Expected Discharge Plan: Skilled Nursing Facility Barriers to Discharge: Other (comment)(COVID positive)   Patient Goals and CMS Choice Patient states their goals for this hospitalization and ongoing recovery are:: Go back to [facility]   Choice offered to / list presented to : NA  Expected Discharge Plan and Services Expected Discharge Plan: [place] In-house Referral: Clinical Social Work   Post Acute Care Choice: Nursing Home Living arrangements for the past 2 months: [place]                                      Prior Living Arrangements/Services Living arrangements for the past 2 months: [place] Lives with:: Facility Resident, Self Patient language and need for interpreter reviewed:: No Do you feel safe going back to the place where you live?: Yes      Need for Family Participation in Patient Care: No (Comment) Care giver support system in place?: Yes (comment)   Criminal Activity/Legal Involvement Pertinent to Current Situation/Hospitalization: No - Comment as needed  Activities of  Daily Living Home Assistive Devices/Equipment: CBG Meter, Grab bars in shower ADL Screening (condition at time of admission) Patient's cognitive ability adequate to safely complete daily activities?: Yes Is the patient deaf or have difficulty hearing?: No Does the patient have difficulty seeing, even when wearing glasses/contacts?: No Does the patient have difficulty concentrating, remembering, or making decisions?: No Patient able to express need for assistance with ADLs?: Yes Does the patient have difficulty dressing or bathing?: Yes Independently performs ADLs?: No Does the patient have difficulty walking or climbing stairs?: Yes Weakness of Legs: Both Weakness of Arms/Hands: None  Permission Sought/Granted Permission sought to share information with : Case Manager Permission granted to share information with : Yes, Release of Information Signed  Share Information with NAME: [name]  Permission granted to share info w AGENCY: SNFs  Permission granted to share info w Relationship: sister  Permission granted to share info w Contact Information: [phone]  Emotional Assessment Appearance:: Appears stated age Attitude/Demeanor/Rapport: Engaged Affect (typically observed): Accepting(in pain) Orientation: : Oriented to Self, Oriented to Place, Oriented to Situation, Oriented to  Time Alcohol / Substance Use: Not Applicable Psych Involvement: No (comment)  Admission diagnosis:  covid 19 ATRIAL FLUTTER RENAL FAILURE Patient Active Problem List   Diagnosis Date Noted  . Atrial flutter ([place]) 09/11/2019  . Respiratory tract infection due to COVID-19 virus 09/11/2019  . Atrial flutter, paroxysmal ([place]) 09/10/2019  . Elevated troponin 09/10/2019  . Dyspnea  09/10/2019  . GERD (gastroesophageal reflux disease) 09/10/2019  . Degenerative joint disease 09/10/2019  . Pulmonary hypertension ([place]) 09/10/2019  . Hypotension 09/10/2019  . Lab test positive for detection of  COVID-19 virus 09/10/2019  . NSTEMI (non-ST elevated myocardial infarction) ([place])   . ESRD on dialysis ([place]) 07/24/2019  . Dizziness 07/03/2019  . Chronic diastolic CHF (congestive heart failure) ([place]) 03/02/2019  . Anemia associated with chronic renal failure 10/25/2018  . Acute hemorrhagic gastritis   . Angiodysplasia of stomach and duodenum   . CKD (chronic kidney disease), stage III 08/04/2018  . Atrial fibrillation with RVR ([place]) 04/23/2018  . Syncope 04/22/2018  . Acute respiratory failure with hypoxia and hypercapnia ([place]) 02/21/2017  . Adjustment disorder with anxiety 01/28/2017  . Acute on chronic respiratory failure with hypoxia ([place]) 01/23/2017  . Palliative care encounter   . Goals of care, counseling/discussion   . DNR (do not resuscitate) 10/05/2016  . Palliative care by specialist 10/05/2016  . Depression, major, recurrent, severe with psychosis ([place]) 10/05/2016  . Severe major depression, single episode, with psychotic features ([place]) 10/04/2016  . Cellulitis of leg, right 09/29/2016  . Hypercapnia 09/29/2016  . Weakness 09/29/2016  . Gastrointestinal hemorrhage   . Altered mental status 09/24/2016  . Pressure injury of skin 09/02/2016  . Respiratory failure with hypoxia ([place]) 09/01/2016  . Lymphedema 08/16/2016  . Acute on chronic heart failure with preserved ejection fraction (HFpEF) ([place]) 08/09/2016  . Acquired lymphedema of leg 04/21/2016  . Nocturia 11/05/2015  . Urinary frequency 11/05/2015  . Acute respiratory failure with hypoxia ([place]) 09/05/2015  . 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 Incontinence 04/20/2015  . Diabetes mellitus, type 2 ([place]) 04/17/2015  . ESBL (extended spectrum beta-lactamase) producing bacteria infection 04/17/2015  . Hypertension 04/17/2015  . Frequent UTI 04/17/2015  . Absence of bladder continence 04/17/2015  . Iron deficiency anemia 03/08/2015  . Symptomatic anemia 09/25/2014  . Abdominal pain, lower 09/25/2014  . Urge incontinence  02/19/2013  . Bladder infection, chronic 08/11/2012  . Difficult or painful urination 07/26/2012  . Lower urinary tract infection 12/31/2011  . Diabetes mellitus ([place]) 12/31/2011   PCP:  System, Provider Not In Pharmacy:   [facility], [address] [place] [address] Phone: [phone] Fax: [phone]  [place], [place] [address] [place] [address] Phone: [phone] Fax: [phone]     Social Determinants of Health (SDOH) Interventions    Readmission Risk Interventions Readmission Risk Prevention Plan 07/04/2019 03/19/2019  Transportation Screening Complete Complete  Medication Review Press photographer) Complete Complete  PCP or Specialist appointment within 3-5 days of discharge Complete Complete  HRI or Home Care Consult Complete Complete  SW Recovery Care/Counseling Consult Complete Not Complete  SW Consult Not Complete Comments - na  Palliative Care Screening Not Applicable Not [place] Patient Refused Not Applicable  Some recent data might be hidden

## 2019-09-22 NOTE — [place] FL2 (Signed)
[place] MEDICAID FL2 LEVEL OF CARE SCREENING TOOL     IDENTIFICATION  Patient Name: Carolyn Valdez: May 25, 1941 Sex: female Admission Date (Current Location): 09/11/2019  [facility] and [place] Number:  Herbalist and Address:  The . [facility], [place] [address] [place], [place] [address]      Provider Number: [id number]  Attending Physician Name and Address:  Barb Merino, MD  Relative Name and Phone Number:  Lorenso Courier [phone]    Current Level of Care: Hospital Recommended Level of Care: [place] Prior Approval Number:    Date Approved/Denied:   PASRR Number: [id number]  Discharge Plan: SNF    Current Diagnoses: Patient Active Problem List   Diagnosis Date Noted  . Atrial flutter ([place]) 09/11/2019  . Respiratory tract infection due to COVID-19 virus 09/11/2019  . Atrial flutter, paroxysmal ([place]) 09/10/2019  . Elevated troponin 09/10/2019  . Dyspnea 09/10/2019  . GERD (gastroesophageal reflux disease) 09/10/2019  . Degenerative joint disease 09/10/2019  . Pulmonary hypertension ([place]) 09/10/2019  . Hypotension 09/10/2019  . Lab test positive for detection of COVID-19 virus 09/10/2019  . NSTEMI (non-ST elevated myocardial infarction) ([place])   . ESRD on dialysis ([place]) 07/24/2019  . Dizziness 07/03/2019  . Chronic diastolic CHF (congestive heart failure) ([place]) 03/02/2019  . Anemia associated with chronic renal failure 10/25/2018  . Acute hemorrhagic gastritis   . Angiodysplasia of stomach and duodenum   . CKD (chronic kidney disease), stage III 08/04/2018  . Atrial fibrillation with RVR ([place]) 04/23/2018  . Syncope 04/22/2018  . Acute respiratory failure with hypoxia and hypercapnia ([place]) 02/21/2017  . Adjustment disorder with anxiety 01/28/2017  . Acute on chronic respiratory failure with hypoxia ([place]) 01/23/2017  . Palliative care encounter   . Goals of care, counseling/discussion   . DNR (do not  resuscitate) 10/05/2016  . Palliative care by specialist 10/05/2016  . Depression, major, recurrent, severe with psychosis ([place]) 10/05/2016  . Severe major depression, single episode, with psychotic features ([place]) 10/04/2016  . Cellulitis of leg, right 09/29/2016  . Hypercapnia 09/29/2016  . Weakness 09/29/2016  . Gastrointestinal hemorrhage   . Altered mental status 09/24/2016  . Pressure injury of skin 09/02/2016  . Respiratory failure with hypoxia ([place]) 09/01/2016  . Lymphedema 08/16/2016  . Acute on chronic heart failure with preserved ejection fraction (HFpEF) ([place]) 08/09/2016  . Acquired lymphedema of leg 04/21/2016  . Nocturia 11/05/2015  . Urinary frequency 11/05/2015  . Acute respiratory failure with hypoxia ([place]) 09/05/2015  . 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 Incontinence 04/20/2015  . Diabetes mellitus, type 2 ([place]) 04/17/2015  . ESBL (extended spectrum beta-lactamase) producing bacteria infection 04/17/2015  . Hypertension 04/17/2015  . Frequent UTI 04/17/2015  . Absence of bladder continence 04/17/2015  . Iron deficiency anemia 03/08/2015  . Symptomatic anemia 09/25/2014  . Abdominal pain, lower 09/25/2014  . Urge incontinence 02/19/2013  . Bladder infection, chronic 08/11/2012  . Difficult or painful urination 07/26/2012  . Lower urinary tract infection 12/31/2011  . Diabetes mellitus ([place]) 12/31/2011    Orientation RESPIRATION BLADDER Height & Weight     Self, Time, Situation, Place  Normal External catheter, Incontinent Weight: 258 lb 9.6 oz (117.3 kg) Height:  5\' 7"  (170.2 cm)  BEHAVIORAL SYMPTOMS/MOOD NEUROLOGICAL BOWEL NUTRITION STATUS      Continent Diet(please see discharge summary)  AMBULATORY STATUS COMMUNICATION OF NEEDS Skin   Limited Assist Verbally (pressure injury sacrum mediacl stage I- foam lift drssing PRN)  Personal Care Assistance Level of Assistance  Bathing, Feeding, Dressing Bathing Assistance: Limited  assistance Feeding assistance: Independent Dressing Assistance: Limited assistance     Functional Limitations Info  Sight, Speech, Hearing Sight Info: Adequate Hearing Info: Adequate Speech Info: Adequate    SPECIAL CARE FACTORS FREQUENCY  PT (By licensed PT), OT (By licensed OT)     PT Frequency: 3x per week OT Frequency: 3x per week            Contractures Contractures Info: Not present    Additional Factors Info  Code Status, Allergies Code Status Info: DNR Allergies Info: Sulfa Antibiotics,Biaxin,Buspar ,Influenza A,Morphine,Pyridium,Tuberculin Tests,Ceftriaxone,Latex,Tape,Prednisone Psychotropic Info: celexa 10mg  daily and zypreca 5mg  daily at bedtime Insulin Sliding Scale Info: insulin aspart (novoLOG) injection 0-15 Units 3x before meals and bedtime,insulin glargine (LANTUS) injection 56 Units daily at bedtime Isolation Precautions Info: Respiratory tract infection due to COVID-19 virus     Current Medications (09/22/2019):  This is the current hospital active medication list Current Facility-Administered Medications  Medication Dose Route Frequency Provider Last Rate Last Dose  . acetaminophen (TYLENOL) tablet 650 mg  650 mg Oral Q6H PRN Bennie Pierini, MD   650 mg at 09/22/19 7124  . albuterol (VENTOLIN HFA) 108 (90 Base) MCG/ACT inhaler 2 puff  2 puff Inhalation Q6H PRN Bennie Pierini, MD      . atorvastatin (LIPITOR) tablet 40 mg  40 mg Oral q1800 Bennie Pierini, MD   40 mg at 09/21/19 2202  . bisacodyl (DULCOLAX) EC tablet 5 mg  5 mg Oral Daily PRN Barb Merino, MD   5 mg at 09/20/19 2054  . calcium acetate (PHOSLO) capsule 1,334 mg  1,334 mg Oral TID AC Bennie Pierini, MD   1,334 mg at 09/22/19 1202  . Chlorhexidine Gluconate Cloth 2 % PADS 6 each  6 each Topical Q0600 Claudia Desanctis, MD   6 each at 09/22/19 0532  . chlorpheniramine-HYDROcodone (TUSSIONEX) 10-8 MG/5ML suspension 5 mL  5 mL Oral Q12H PRN Bennie Pierini, MD      . citalopram  (CELEXA) tablet 10 mg  10 mg Oral Daily Bennie Pierini, MD   10 mg at 09/22/19 1002  . clonazePAM (KLONOPIN) disintegrating tablet 0.25 mg  0.25 mg Oral Q12H PRN Bennie Pierini, MD   0.25 mg at 09/21/19 2245  . docusate sodium (COLACE) capsule 100 mg  100 mg Oral BID Bennie Pierini, MD   100 mg at 09/22/19 1002  . guaiFENesin-dextromethorphan (ROBITUSSIN DM) 100-10 MG/5ML syrup 10 mL  10 mL Oral Q4H PRN Bennie Pierini, MD      . heparin injection 1,000 Units  1,000 Units Intravenous Q dialysis Donato Heinz, MD   2,600 Units at 09/19/19 1933  . insulin aspart (novoLOG) injection 0-15 Units  0-15 Units Subcutaneous TID AC & HS Omar Person, NP   5 Units at 09/22/19 1206  . insulin aspart (novoLOG) injection 10 Units  10 Units Subcutaneous TID WC Barb Merino, MD   10 Units at 09/22/19 1202  . insulin glargine (LANTUS) injection 56 Units  56 Units Subcutaneous QHS Barb Merino, MD   56 Units at 09/21/19 2245  . lamoTRIgine (LAMICTAL) tablet 25 mg  25 mg Oral Daily Bennie Pierini, MD   25 mg at 09/22/19 1002  . levETIRAcetam (KEPPRA) tablet 250 mg  250 mg Oral BID Bennie Pierini, MD   250 mg at 09/22/19 1203  . magic mouthwash w/lidocaine  10 mL  Oral QID PRN Oswald Hillock, MD   10 mL at 09/18/19 1618  . MEDLINE mouth rinse  15 mL Mouth Rinse BID Oswald Hillock, MD   15 mL at 09/22/19 1006  . metoprolol tartrate (LOPRESSOR) tablet 75 mg  75 mg Oral BID Barb Merino, MD   75 mg at 09/22/19 1001  . mirabegron ER (MYRBETRIQ) tablet 25 mg  25 mg Oral Daily Bennie Pierini, MD   25 mg at 09/22/19 1002  . OLANZapine (ZYPREXA) tablet 5 mg  5 mg Oral QHS Bennie Pierini, MD   5 mg at 09/21/19 2201  . ondansetron (ZOFRAN) tablet 4 mg  4 mg Oral Q6H PRN Bennie Pierini, MD       Or  . ondansetron [facility]) injection 4 mg  4 mg Intravenous Q6H PRN Bennie Pierini, MD   4 mg at 09/15/19 3428  . oxyCODONE (Oxy IR/ROXICODONE) immediate release tablet 5 mg  5 mg  Oral Q4H PRN Bennie Pierini, MD   5 mg at 09/22/19 7681  . pantoprazole (PROTONIX) EC tablet 40 mg  40 mg Oral Daily Bennie Pierini, MD   40 mg at 09/22/19 1001  . polyethylene glycol (MIRALAX / GLYCOLAX) packet 17 g  17 g Oral Daily PRN Bennie Pierini, MD   17 g at 09/21/19 [phone]  . senna (SENOKOT) tablet 8.6 mg  1 tablet Oral BID Bennie Pierini, MD   8.6 mg at 09/22/19 1002  . sodium chloride flush (NS) 0.9 % injection 3 mL  3 mL Intravenous Q12H Bennie Pierini, MD   3 mL at 09/22/19 1006  . torsemide [facility]) tablet 40 mg  40 mg Oral Once per day on Sun Tue Thu Sat Bennie Pierini, MD   40 mg at 09/22/19 1202  . vitamin C (ASCORBIC ACID) tablet 500 mg  500 mg Oral Daily Bennie Pierini, MD   500 mg at 09/22/19 1002  . warfarin (COUMADIN) tablet 7.5 mg  7.5 mg Oral ONCE-1800 Hammons, Theone Murdoch, RPH      . Warfarin - Pharmacist Dosing Inpatient   Does not apply q1800 [place], Marge Duncans, MD      . zinc sulfate capsule 220 mg  220 mg Oral Daily Bennie Pierini, MD   220 mg at 09/22/19 1002     Discharge Medications: Please see discharge summary for a list of discharge medications.  Relevant Imaging Results:  Relevant Lab Results:   Additional Information SSN [id number]             HD at [facility],  [place], [place]

## 2019-09-22 NOTE — Progress Notes (Signed)
PROGRESS NOTE    Carolyn Valdez  CHY:[id number] DOB: 1940-12-06 DOA: 09/11/2019 PCP: System, Provider Not In    Brief Narrative:  78 year old female with history of ESRD on hemodialysis Monday Wednesday Friday via right IJ permacath, atrial flutter, chronic diastolic heart failure, severe pulmonary hypertension, GI bleed, breast cancer in remission who was transferred from [facility] hospital per nephrology recommendations for dialysis.  She initially presented to emergency room from dialysis center where she was hypotensive and tachycardic.  Dialysis session had to be stopped early.  She was diagnosed with COVID-19 on 11/4 when she was at assisted living facility.  At the time of presentation in the ER, initial troponin elevated to 2336, global ST depression and 2-1 a flutter on EKG.  Cardiology was consulted for non-STEMI and a flutter.  Patient was started on beta-blockers and heparin infusion.  Plan was to pursue cardiac catheterization, however became hypotensive in  emergency room and transferred to [facility]. Admitted to the hospital treated for COVID-19 pneumonia, Non-STEMI and dialysis need. 09/21/2019: Isolation taken off.  Waiting for skilled nursing facility availability.   Assessment & Plan:   Active Problems:   Respiratory tract infection due to COVID-19 virus  COVID-19 pneumonia: Currently without respiratory symptoms.  She did have elevated inflammatory biomarkers.  Patient finished 5 days of remdesivir therapy.  She is on Decadron, day 10/10.  Discontinued.  Clinically improving. According to recent CDC guidelines and infection control hospital policy, discontinue airborne isolation since first diagnosis of COVID-19 about a month ago.  NSTEMI: Patient had troponin elevation and EKG changes.  Patient was treated with heparin drip and transferred to [facility]. Cardiology following, no plans for cardiac cath and medical management advised.  They will follow up in the future  to consider ischemic evaluation.  Currently on aspirin, statin and beta-blockers.   Atrial flutter with RVR: Permanent.  Heart rate ranges from 50-120. Metoprolol with holding parameters. She is on metoprolol 75 mg twice a day.  Continue.  Continue Coumadin.  INR 1.9.  Left lower extremity cellulitis: Treated with ceftriaxone.  Clinically improved.    Hypertension: Blood pressure stable.  Type 2 diabetes with hyperglycemia: Aggravated by use of Decadron.  On increased dose of insulin.  Discontinue Decadron today and monitor blood sugars.  History of seizure disorder: Continue Keppra.  No new seizures.  Physical deconditioning: Patient will benefit with inpatient PT OT before returning to assisted living facility.  Constipation: Patient has persistent constipation.  Additional dose of magnesium citrate today.    Disposition discussed with patient.  She really wants to go back to [place].  Patient agreeable to go to skilled rehab for short-term.  She did not have any choices, however now she is off airborne precautions.  She really likes to go somewhere near [place] where she lives in [facility] assisted living place.    DVT prophylaxis: Coumadin. Code Status: DNR Family Communication: None Disposition Plan: Medically stable to go to a skilled nursing level of care before transferring to assisted living.   Consultants:   Nephrology  Procedures:   Dialysis  Antimicrobials:  Antibiotics Given (last 72 hours)    Date/Time Action Medication Dose Rate   09/19/19 2228 New Bag/Given   cefTRIAXone (ROCEPHIN) 2 g in sodium chloride 0.9 % 100 mL IVPB 2 g 200 mL/hr         Subjective: Patient seen and examined.  No overnight events.  Tachycardia on the monitor before metoprolol, heart rate did come down to  64 after metoprolol in the morning. Remains afebrile.  On minimum oxygen.  Saturating 98%. She had small volume hard stool after magnesium citrate and soapsuds enema  yesterday, she needs it more. Patient was very happy to come off the airborne isolation and noisy machine in the room. She is agreeable to go to rehab, she wants to go somewhere near [place].  Objective: Vitals:   09/21/19 2128 09/22/19 0302 09/22/19 0538 09/22/19 0847  BP: (!) 138/42 126/68 119/67 (!) 137/49  Pulse: 80 (!) 128 (!) 130 64  Resp: 16 16 16 18   Temp: 98.3 F (36.8 C) 99.2 F (37.3 C) 98.7 F (37.1 C) 99.1 F (37.3 C)  TempSrc: Oral Oral Oral Oral  SpO2: 94% 99% 96% 98%  Weight: 117.3 kg     Height:        Intake/Output Summary (Last 24 hours) at 09/22/2019 1035 Last data filed at 09/22/2019 0900 Gross per 24 hour  Intake 660 ml  Output 2500 ml  Net -1840 ml   Filed Weights   09/17/19 1015 09/17/19 1358 09/21/19 2128  Weight: 128.4 kg 125 kg 117.3 kg    Examination:  General exam: Appears calm and comfortable, Not in any obvious distress. Respiratory system: Clear to auscultation. Respiratory effort normal.  Right IJ permacath present. Cardiovascular system: S1 & S2 heard, RRR. No JVD, murmurs, rubs, gallops or clicks.   Gastrointestinal system: Abdomen is nondistended, soft and nontender. No organomegaly or masses felt. Normal bowel sounds heard. Central nervous system: Alert and oriented. No focal neurological deficits. Extremities: Symmetric 5 x 5 power. Skin: No rashes, lesions or ulcers Psychiatry: Judgement and insight appear normal. Mood & affect normal.    Data Reviewed: I have personally reviewed following labs and imaging studies  CBC: Recent Labs  Lab 09/16/19 0424 09/18/19 0322 09/20/19 0444 09/21/19 0135  WBC 12.3* 11.2* 11.1* 10.4  NEUTROABS 9.7*  --   --   --   HGB 9.9* 9.7* 9.7* 9.1*  HCT 31.6* 31.1* 29.9* 29.3*  MCV 92.4 92.3 89.8 93.3  PLT 398 300 257 338   Basic Metabolic Panel: Recent Labs  Lab 09/16/19 0424  09/18/19 0322 09/19/19 0422 09/20/19 0444 09/21/19 0135 09/22/19 0431  NA 129*   < > 131* 127* 130* 127*  130*  K 5.2*   < > 4.8 4.9 4.8 5.2* 4.3  CL 93*   < > 94* 92* 94* 93* 92*  CO2 22   < > 19* 21* 25 21* 23  GLUCOSE 427*   < > 282* 294* 311* 325* 245*  BUN 44*   < > 48* 68* 35* 50* 38*  CREATININE 5.92*   < > 4.92* 5.52* 3.66* 4.61* 3.53*  CALCIUM 9.2   < > 8.5* 9.2 8.3* 9.3 8.9  MG 2.0  --  1.9  --   --   --   --   PHOS 4.7*   < > 3.8 4.5 3.9 4.9* 3.5   < > = values in this interval not displayed.   GFR: Estimated Creatinine Clearance: 18 mL/min (A) (by C-G formula based on SCr of 3.53 mg/dL (H)). Liver Function Tests: Recent Labs  Lab 09/16/19 0424  09/18/19 0322 09/19/19 0422 09/20/19 0444 09/21/19 0135 09/22/19 0431  AST 17  --   --   --   --   --   --   ALT 14  --   --   --   --   --   --  ALKPHOS 70  --   --   --   --   --   --   BILITOT 0.6  --   --   --   --   --   --   PROT 6.5  --   --   --   --   --   --   ALBUMIN 3.0*   < > 2.8* 2.7* 2.7* 2.8* 3.0*   < > = values in this interval not displayed.   No results for input(s): LIPASE, AMYLASE in the last 168 hours. No results for input(s): AMMONIA in the last 168 hours. Coagulation Profile: Recent Labs  Lab 09/18/19 0322 09/19/19 0422 09/20/19 0444 09/21/19 0135 09/22/19 0431  INR 1.1 1.2 1.3* 1.5* 1.9*   Cardiac Enzymes: No results for input(s): CKTOTAL, CKMB, CKMBINDEX, TROPONINI in the last 168 hours. BNP (last 3 results) No results for input(s): PROBNP in the last 8760 hours. HbA1C: No results for input(s): HGBA1C in the last 72 hours. CBG: Recent Labs  Lab 09/21/19 0750 09/21/19 1233 09/21/19 1858 09/21/19 2130 09/22/19 0635  GLUCAP 314* 236* 252* 416* 229*   Lipid Profile: No results for input(s): CHOL, HDL, LDLCALC, TRIG, CHOLHDL, LDLDIRECT in the last 72 hours. Thyroid Function Tests: No results for input(s): TSH, T4TOTAL, FREET4, T3FREE, THYROIDAB in the last 72 hours. Anemia Panel: No results for input(s): VITAMINB12, FOLATE, FERRITIN, TIBC, IRON, RETICCTPCT in the last 72 hours.  Sepsis Labs: No results for input(s): PROCALCITON, LATICACIDVEN in the last 168 hours.  No results found for this or any previous visit (from the past 240 hour(s)).       Radiology Studies: No results found.      Scheduled Meds: . atorvastatin  40 mg Oral q1800  . calcium acetate  1,334 mg Oral TID AC  . Chlorhexidine Gluconate Cloth  6 each Topical Q0600  . citalopram  10 mg Oral Daily  . docusate sodium  100 mg Oral BID  . insulin aspart  0-15 Units Subcutaneous TID AC & HS  . insulin aspart  10 Units Subcutaneous TID WC  . insulin glargine  56 Units Subcutaneous QHS  . lamoTRIgine  25 mg Oral Daily  . levETIRAcetam  250 mg Oral BID  . mouth rinse  15 mL Mouth Rinse BID  . metoprolol tartrate  75 mg Oral BID  . mirabegron ER  25 mg Oral Daily  . OLANZapine  5 mg Oral QHS  . pantoprazole  40 mg Oral Daily  . senna  1 tablet Oral BID  . sodium chloride flush  3 mL Intravenous Q12H  . torsemide  40 mg Oral Once per day on Sun Tue Thu Sat  . vitamin C  500 mg Oral Daily  . Warfarin - Pharmacist Dosing Inpatient   Does not apply q1800  . zinc sulfate  220 mg Oral Daily   Continuous Infusions:    LOS: 11 days    Time spent: 25 minutes    Barb Merino, MD [facility] Pager [phone]

## 2019-09-22 NOTE — Progress Notes (Signed)
ANTICOAGULATION CONSULT NOTE - Follow Up Consult  Pharmacy Consult for warfarin Indication: chest pain/ACS and atrial fibrillation  Allergies  Allergen Reactions  . Sulfa Antibiotics Hives  . Biaxin [Clarithromycin] Hives  . Buspar [Buspirone]   . Influenza A (H1n1) Monoval Vac Other (See Comments)    Pt states that she was told by her MD not to get the influenza vaccine.    . Morphine Other (See Comments)    Reaction:  Dizziness and confusion   . Pyridium [Phenazopyridine Hcl] Other (See Comments)    Reaction:  Unknown   . Tuberculin Tests   . Ceftriaxone Anxiety  . Latex Rash  . Prednisone Rash  . Tape Rash    Patient Measurements: Height: 5\' 7"  (170.2 cm) Weight: 258 lb 9.6 oz (117.3 kg) IBW/kg (Calculated) : 61.6 Heparin Dosing Weight: 91 kg  Vital Signs: Temp: 99.1 F (37.3 C) (12/05 0847) Temp Source: Oral (12/05 0847) BP: 137/49 (12/05 0847) Pulse Rate: 64 (12/05 0847)  Labs: Recent Labs    09/20/19 0444 09/21/19 0135 09/22/19 0431  HGB 9.7* 9.1*  --   HCT 29.9* 29.3*  --   PLT 257 249  --   LABPROT 16.4* 17.9* 21.2*  INR 1.3* 1.5* 1.9*  HEPARINUNFRC 1.52*  --   --   CREATININE 3.66* 4.61* 3.53*    Estimated Creatinine Clearance: 18 mL/min (A) (by C-G formula based on SCr of 3.53 mg/dL (H)).   Assessment: 78 yof transferred from [facility] with atrial fibrillation and NSTEMI. No anticoagulation PTA, CHADSVASC 5. Positive troponins on admission with a peak of 2336. Of note, she was COVID + at her ALF 11/4. Noted hx GIB in 07/2018.  Heparin d/c'd on 12/3 per [facility] MD. INR trending up nicely after 2 x 10mg  warfarin doses.  LFTs wnl. CBC stable. No significant drug interactions noted.    Goal of Therapy:  INR 2-3 Monitor platelets by anticoagulation protocol: Yes   Plan:  Reduce Warfarin 7.5mg  PO x 1 tonight Monitor daily INR, CBC, s/sx bleeding  [employer], Pharm.D., BCPS Clinical Pharmacist Clinical phone for 09/22/2019 from 8:30-4:00 is  [phone].  **Pharmacist phone directory can now be found on [email or web address] ([email or web address]).  Listed under [place].  09/22/2019 10:48 AM

## 2019-09-23 LAB — RENAL FUNCTION PANEL
Albumin: 2.7 g/dL — ABNORMAL LOW (ref 3.5–5.0)
Anion gap: 12 (ref 5–15)
BUN: 52 mg/dL — ABNORMAL HIGH (ref 8–23)
CO2: 23 mmol/L (ref 22–32)
Calcium: 8.7 mg/dL — ABNORMAL LOW (ref 8.9–10.3)
Chloride: 94 mmol/L — ABNORMAL LOW (ref 98–111)
Creatinine, Ser: 4.01 mg/dL — ABNORMAL HIGH (ref 0.44–1.00)
GFR calc Af Amer: 12 mL/min — ABNORMAL LOW (ref >60)
GFR calc non Af Amer: 10 mL/min — ABNORMAL LOW (ref >60)
Glucose, Bld: 108 mg/dL — ABNORMAL HIGH (ref 70–99)
Phosphorus: 3.8 mg/dL (ref 2.5–4.6)
Potassium: 4.5 mmol/L (ref 3.5–5.1)
Sodium: 129 mmol/L — ABNORMAL LOW (ref 135–145)

## 2019-09-23 LAB — GLUCOSE, CAPILLARY
Glucose-Capillary: 98 mg/dL (ref 70–99)
Glucose-Capillary: 138 mg/dL — ABNORMAL HIGH (ref 70–99)
Glucose-Capillary: 252 mg/dL — ABNORMAL HIGH (ref 70–99)
Glucose-Capillary: 178 mg/dL — ABNORMAL HIGH (ref 70–99)

## 2019-09-23 LAB — PROTIME-INR
INR: 2.2 — ABNORMAL HIGH (ref 0.8–1.2)
Prothrombin Time: 24.7 seconds — ABNORMAL HIGH (ref 11.4–15.2)

## 2019-09-23 MED ORDER — GLUCERNA SHAKE PO LIQD
237.0000 mL | Freq: Three times a day (TID) | ORAL | Status: DC
  Administered 2019-09-24 – 2019-10-02 (×12): 237 mL via ORAL

## 2019-09-23 MED ORDER — PHENYLEPH-SHARK LIV OIL-MO-PET 0.25-3-14-71.9 % RE OINT
TOPICAL_OINTMENT | Freq: Two times a day (BID) | RECTAL | Status: DC | PRN
  Filled 2019-09-23: qty 28.4

## 2019-09-23 MED ORDER — WARFARIN SODIUM 7.5 MG PO TABS
7.5000 mg | ORAL_TABLET | Freq: Once | ORAL | Status: AC
  Administered 2019-09-23: 7.5 mg via ORAL
  Filled 2019-09-23: qty 1

## 2019-09-23 NOTE — TOC Progression Note (Signed)
Transition of Care [facility]) - Progression Note    Patient Details  Name: Carolyn Valdez MRN: [id number] Date of Birth: 1941-07-28  Transition of Care [facility]) CM/SW [place], [place] Phone Number: 09/23/2019, 10:39 AM  Clinical Narrative:     Patient currently does not have any bed offers. CSW will continue to follow and assist with TOC needs.   Expected Discharge Plan: Skilled Nursing Facility Barriers to Discharge: Other (comment)(COVID positive)  Expected Discharge Plan and Services Expected Discharge Plan: [place] In-house Referral: Clinical Social Work   Post Acute Care Choice: Nursing Home Living arrangements for the past 2 months: [place]                                       Social Determinants of Health (SDOH) Interventions    Readmission Risk Interventions Readmission Risk Prevention Plan 07/04/2019 03/19/2019  Transportation Screening Complete Complete  Medication Review Press photographer) Complete Complete  PCP or Specialist appointment within 3-5 days of discharge Complete Complete  HRI or [place] Complete Complete  SW Recovery Care/Counseling Consult Complete Not Complete  SW Consult Not Complete Comments - na  Palliative Care Screening Not Applicable Not [place] Patient Refused Not Applicable  Some recent data might be hidden

## 2019-09-23 NOTE — Progress Notes (Signed)
ANTICOAGULATION CONSULT NOTE - Follow Up Consult  Pharmacy Consult for warfarin Indication: chest pain/ACS and atrial fibrillation  Allergies  Allergen Reactions  . Sulfa Antibiotics Hives  . Biaxin [Clarithromycin] Hives  . Buspar [Buspirone]   . Influenza A (H1n1) Monoval Vac Other (See Comments)    Pt states that she was told by her MD not to get the influenza vaccine.    . Morphine Other (See Comments)    Reaction:  Dizziness and confusion   . Pyridium [Phenazopyridine Hcl] Other (See Comments)    Reaction:  Unknown   . Tuberculin Tests   . Ceftriaxone Anxiety  . Latex Rash  . Prednisone Rash  . Tape Rash    Patient Measurements: Height: 5\' 7"  (170.2 cm) Weight: 295 lb 3.1 oz (133.9 kg) IBW/kg (Calculated) : 61.6 Heparin Dosing Weight: 91 kg  Vital Signs: Temp: 98.3 F (36.8 C) (12/06 0828) Temp Source: Oral (12/06 0828) BP: 132/50 (12/06 0828) Pulse Rate: 54 (12/06 0828)  Labs: Recent Labs    09/21/19 0135 09/22/19 0431 09/23/19 0541  HGB 9.1*  --   --   HCT 29.3*  --   --   PLT 249  --   --   LABPROT 17.9* 21.2* 24.7*  INR 1.5* 1.9* 2.2*  CREATININE 4.61* 3.53* 4.01*    Estimated Creatinine Clearance: 17.1 mL/min (A) (by C-G formula based on SCr of 4.01 mg/dL (H)).   Assessment: 37 yof transferred from [facility] with atrial fibrillation and NSTEMI. No anticoagulation PTA, CHADSVASC 5. Positive troponins on admission with a peak of 2336. Of note, she was COVID + at her ALF 11/4. Noted hx GIB in 07/2018.  Heparin d/c'd on 12/3 per [facility] MD. INR trending up nicely after 2 x 10mg  warfarin doses and is now therapeutic.  LFTs wnl. CBC stable. No significant drug interactions noted.    Goal of Therapy:  INR 2-3 Monitor platelets by anticoagulation protocol: Yes   Plan:  Repeat Warfarin 7.5mg  PO x 1 tonight Monitor daily INR, CBC, s/sx bleeding  [employer], Pharm.D., BCPS Clinical Pharmacist Clinical phone for 09/23/2019 from 8:30-4:00 is  [phone].  **Pharmacist phone directory can now be found on [email or web address] ([email or web address]).  Listed under [place].  09/23/2019 9:19 AM

## 2019-09-23 NOTE — Progress Notes (Signed)
PROGRESS NOTE  Carolyn Valdez HCW:[id number] DOB: 05/05/1941 DOA: 09/11/2019 PCP: System, Provider Not In  HPI/Recap of past 35 hours: 78 year old female with past medical history significant for end-stage renal disease on hemodialysis Monday Wednesday Friday via right IJ permacath, atrial flutter, chronic diastolic CHF, severe pulmonary hypertension, GI bleed, breast cancer status post right mastectomy who was transferred from [facility] hospital per nephrology recommendation to continue hemodialysis at [facility] in the setting of COVID-19 viral infection.  Diagnosed on 08/22/2019 when she was at assisted living facility.  At the time of her presentation to the ED, initial troponin S greater than 2300 with global ST depressions, 2-1 A-flutter on EKG.  Seen by cardiology at [facility] ED and started on beta-blocker and heparin drip.  Plan was to pursue cardiac catheterization however she became hypotensive in the emergency room and was transferred to [facility].  Admitted for treatment of COVID-19 viral pneumonia, non-ST elevation MI and for hemodialysis needs. Taken off isolation on 09/21/2019.  Awaiting SNF placement.  09/23/2019: Seen and examined.  Reports rectal pain and intermittent nausea without vomiting.  Assessment/Plan: Active Problems:   Respiratory tract infection due to COVID-19 virus  Treated COVID-19 viral pneumonia Per medical record initially tested positive with COVID-19 on 08/22/2019 O2 saturation 97% on 1.5 L. Maintain O2 saturation greater than 94% Continue albuterol inhaler as needed  NSTEMI Denies anginal symptoms at the time of this evaluation Had elevated troponin and abnormal EKG on admission Seen by cardiology, with follow-up in the future to consider ischemic evaluation. Continue aspirin statin and beta-blockers.  A flutter with RVR, permanent. Rate is controlled on beta-blocker On Coumadin for primary CVA prevention INR is therapeutic at 2.2   Left lower extremity cellulitis, nonpurulent Completed course of ceftriaxone Clinically improved  Type 2 diabetes, well controlled  A1c 7.9 on 09/10/2019 Continue insulin coverage  Hypervolemic hyponatremia Sodium 129, trending down Volume management per nephrology  Chronic diastolic CHF 17-61% Mitral valve abnormal Management per cardiology Volume status management via hemodialysis  ESRD MWF Right IJ permacath Management per nephrology  Rectal pain/nausea Continue symptomatic management   DVT prophylaxis: Coumadin. Code Status: DNR Family Communication: None  Disposition Plan: Medically stable to go to a skilled nursing level of care before transferring to assisted living.    Objective: Vitals:   09/22/19 2305 09/23/19 0100 09/23/19 0510 09/23/19 0828  BP:   (!) 148/53 (!) 132/50  Pulse: 62  (!) 56 (!) 54  Resp:   20 18  Temp:   99.3 F (37.4 C) 98.3 F (36.8 C)  TempSrc:   Oral Oral  SpO2:   97% 96%  Weight:  133.9 kg    Height:        Intake/Output Summary (Last 24 hours) at 09/23/2019 1444 Last data filed at 09/23/2019 0900 Gross per 24 hour  Intake 540 ml  Output 0 ml  Net 540 ml   Filed Weights   09/17/19 1358 09/21/19 2128 09/23/19 0100  Weight: 125 kg 117.3 kg 133.9 kg    Exam:  . General: 78 y.o. year-old female well developed well nourished in no acute distress.  Alert and oriented x3. . Cardiovascular: Regular rate and rhythm with no rubs or gallops.  No thyromegaly or JVD noted.   Marland Kitchen Respiratory: Clear to auscultation with no wheezes or rales.  Poor inspiratory effort. . Abdomen: Soft nontender nondistended with normal bowel sounds x4 quadrants. . Musculoskeletal: 1+ pitting edema lower extremities bilaterally.   Marland Kitchen Psychiatry:  Mood is appropriate for condition and setting   Data Reviewed: CBC: Recent Labs  Lab 09/18/19 0322 09/20/19 0444 09/21/19 0135  WBC 11.2* 11.1* 10.4  HGB 9.7* 9.7* 9.1*  HCT 31.1* 29.9* 29.3*  MCV 92.3  89.8 93.3  PLT 300 257 408   Basic Metabolic Panel: Recent Labs  Lab 09/18/19 0322 09/19/19 0422 09/20/19 0444 09/21/19 0135 09/22/19 0431 09/23/19 0541  NA 131* 127* 130* 127* 130* 129*  K 4.8 4.9 4.8 5.2* 4.3 4.5  CL 94* 92* 94* 93* 92* 94*  CO2 19* 21* 25 21* 23 23  GLUCOSE 282* 294* 311* 325* 245* 108*  BUN 48* 68* 35* 50* 38* 52*  CREATININE 4.92* 5.52* 3.66* 4.61* 3.53* 4.01*  CALCIUM 8.5* 9.2 8.3* 9.3 8.9 8.7*  MG 1.9  --   --   --   --   --   PHOS 3.8 4.5 3.9 4.9* 3.5 3.8   GFR: Estimated Creatinine Clearance: 17.1 mL/min (A) (by C-G formula based on SCr of 4.01 mg/dL (H)). Liver Function Tests: Recent Labs  Lab 09/19/19 0422 09/20/19 0444 09/21/19 0135 09/22/19 0431 09/23/19 0541  ALBUMIN 2.7* 2.7* 2.8* 3.0* 2.7*   No results for input(s): LIPASE, AMYLASE in the last 168 hours. No results for input(s): AMMONIA in the last 168 hours. Coagulation Profile: Recent Labs  Lab 09/19/19 0422 09/20/19 0444 09/21/19 0135 09/22/19 0431 09/23/19 0541  INR 1.2 1.3* 1.5* 1.9* 2.2*   Cardiac Enzymes: No results for input(s): CKTOTAL, CKMB, CKMBINDEX, TROPONINI in the last 168 hours. BNP (last 3 results) No results for input(s): PROBNP in the last 8760 hours. HbA1C: No results for input(s): HGBA1C in the last 72 hours. CBG: Recent Labs  Lab 09/22/19 1108 09/22/19 1618 09/22/19 2150 09/23/19 0654 09/23/19 1104  GLUCAP 219* 142* 122* 98 138*   Lipid Profile: No results for input(s): CHOL, HDL, LDLCALC, TRIG, CHOLHDL, LDLDIRECT in the last 72 hours. Thyroid Function Tests: No results for input(s): TSH, T4TOTAL, FREET4, T3FREE, THYROIDAB in the last 72 hours. Anemia Panel: No results for input(s): VITAMINB12, FOLATE, FERRITIN, TIBC, IRON, RETICCTPCT in the last 72 hours. Urine analysis:    Component Value Date/Time   COLORURINE AMBER (A) 07/03/2019 0600   APPEARANCEUR CLOUDY (A) 07/03/2019 0600   APPEARANCEUR Cloudy (A) 01/16/2018 1349   LABSPEC 1.020  07/03/2019 0600   LABSPEC 1.008 09/08/2014 1719   PHURINE 5.0 07/03/2019 0600   GLUCOSEU NEGATIVE 07/03/2019 0600   GLUCOSEU Negative 09/08/2014 1719   HGBUR NEGATIVE 07/03/2019 0600   BILIRUBINUR NEGATIVE 07/03/2019 0600   BILIRUBINUR Negative 01/16/2018 1349   BILIRUBINUR Negative 09/08/2014 1719   KETONESUR NEGATIVE 07/03/2019 0600   PROTEINUR 100 (A) 07/03/2019 0600   NITRITE NEGATIVE 07/03/2019 0600   LEUKOCYTESUR LARGE (A) 07/03/2019 0600   LEUKOCYTESUR 3+ 09/08/2014 1719   Sepsis Labs: @LABRCNTIP (procalcitonin:4,lacticidven:4)  )No results found for this or any previous visit (from the past 240 hour(s)).    Studies: No results found.  Scheduled Meds: . atorvastatin  40 mg Oral q1800  . calcium acetate  1,334 mg Oral TID AC  . Chlorhexidine Gluconate Cloth  6 each Topical Q0600  . citalopram  10 mg Oral Daily  . docusate sodium  100 mg Oral BID  . insulin aspart  0-15 Units Subcutaneous TID AC & HS  . insulin aspart  10 Units Subcutaneous TID WC  . insulin glargine  22 Units Subcutaneous QHS  . lamoTRIgine  25 mg Oral Daily  . levETIRAcetam  250 mg  Oral BID  . mouth rinse  15 mL Mouth Rinse BID  . metoprolol tartrate  75 mg Oral BID  . mirabegron ER  25 mg Oral Daily  . OLANZapine  5 mg Oral QHS  . pantoprazole  40 mg Oral Daily  . senna  1 tablet Oral BID  . sodium chloride flush  3 mL Intravenous Q12H  . torsemide  40 mg Oral Once per day on Sun Tue Thu Sat  . vitamin C  500 mg Oral Daily  . warfarin  7.5 mg Oral ONCE-1800  . Warfarin - Pharmacist Dosing Inpatient   Does not apply q1800  . zinc sulfate  220 mg Oral Daily    Continuous Infusions:   LOS: 12 days     Kayleen Memos, MD [facility] Pager [phone]  If 7PM-7AM, please contact night-coverage [email or web address] Password [email or web address] 09/23/2019, 2:44 PM

## 2019-09-23 NOTE — Progress Notes (Signed)
Patient ID: Carolyn Valdez, female   DOB: 1940-12-13, 78 y.o.   MRN: [id number] [facility] Progress Note   Assessment/ Plan:   1.  COVID-19 infection: Respiratory status back to baseline following remdesivir/corticosteroids.  Course complicated by non-STEMI that is ongoing medical management. 2. ESRD: We will continue hemodialysis on a Monday/Wednesday/Friday schedule pending placement initiation/scheduling.  Trace-1+ lower extremity edema; reiterated interdialytic weight gain restriction and will attempt increased UF at HD.  Continue torsemide on nondialysis days. 3. Anemia: Status post ESA, no overt blood loss.  Monitor with hemodialysis/UF. 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 5.  Atrial flutter/atrial fibrillation with RVR: With RVR noted overnight, continue beta-blocker.  On Coumadin. 6. Hypertension: Blood pressure under acceptable control, continue to monitor with hemodialysis/current medications. 7.  Disposition: Awaiting nursing facility placement around [place].  Subjective:   Complains of some nausea this morning with associated rectal pain on and off.   Objective:   BP (!) 132/50 (BP Location: Left Arm)   Pulse (!) 54   Temp 98.3 F (36.8 C) (Oral)   Resp 18   Ht 5\' 7"  (0.102 m)   Wt 133.9 kg   SpO2 96%   BMI 46.23 kg/m   Physical Exam: Gen: Appears uncomfortable resting in bed, nauseated CVS: Pulse regular bradycardia, S1 and S2 normal Resp: Diminished breath sounds over bases, poor inspiratory effort.  No rales/rhonchi.  Right IJ TDC Abd: Soft, obese, nontender Ext: Trace lower extremity edema  Labs: BMET Recent Labs  Lab 09/17/19 0540 09/18/19 0322 09/19/19 0422 09/20/19 0444 09/21/19 0135 09/22/19 0431 09/23/19 0541  NA 128* 131* 127* 130* 127* 130* 129*  K 5.0 4.8 4.9 4.8 5.2* 4.3 4.5  CL 93* 94* 92* 94* 93* 92* 94*  CO2 23 19* 21* 25 21* 23 23  GLUCOSE 281* 282*  294* 311* 325* 245* 108*  BUN 62* 48* 68* 35* 50* 38* 52*  CREATININE 6.94* 4.92* 5.52* 3.66* 4.61* 3.53* 4.01*  CALCIUM 8.9 8.5* 9.2 8.3* 9.3 8.9 8.7*  PHOS 4.5 3.8 4.5 3.9 4.9* 3.5 3.8   CBC Recent Labs  Lab 09/18/19 0322 09/20/19 0444 09/21/19 0135  WBC 11.2* 11.1* 10.4  HGB 9.7* 9.7* 9.1*  HCT 31.1* 29.9* 29.3*  MCV 92.3 89.8 93.3  PLT 300 257 249   Medications:    . atorvastatin  40 mg Oral q1800  . calcium acetate  1,334 mg Oral TID AC  . Chlorhexidine Gluconate Cloth  6 each Topical Q0600  . citalopram  10 mg Oral Daily  . docusate sodium  100 mg Oral BID  . insulin aspart  0-15 Units Subcutaneous TID AC & HS  . insulin aspart  10 Units Subcutaneous TID WC  . insulin glargine  22 Units Subcutaneous QHS  . lamoTRIgine  25 mg Oral Daily  . levETIRAcetam  250 mg Oral BID  . mouth rinse  15 mL Mouth Rinse BID  . metoprolol tartrate  75 mg Oral BID  . mirabegron ER  25 mg Oral Daily  . OLANZapine  5 mg Oral QHS  . pantoprazole  40 mg Oral Daily  . senna  1 tablet Oral BID  . sodium chloride flush  3 mL Intravenous Q12H  . torsemide  40 mg Oral Once per day on Sun Tue Thu Sat  . vitamin C  500 mg Oral Daily  . warfarin  7.5 mg Oral ONCE-1800  . Warfarin - Pharmacist Dosing Inpatient  Does not apply q1800  . zinc sulfate  220 mg Oral Daily   Elmarie Shiley, MD 09/23/2019, 10:05 AM

## 2019-09-24 LAB — GLUCOSE, CAPILLARY
Glucose-Capillary: 132 mg/dL — ABNORMAL HIGH (ref 70–99)
Glucose-Capillary: 146 mg/dL — ABNORMAL HIGH (ref 70–99)
Glucose-Capillary: 88 mg/dL (ref 70–99)
Glucose-Capillary: 223 mg/dL — ABNORMAL HIGH (ref 70–99)

## 2019-09-24 LAB — PROTIME-INR
INR: 2.5 — ABNORMAL HIGH (ref 0.8–1.2)
Prothrombin Time: 27.3 seconds — ABNORMAL HIGH (ref 11.4–15.2)

## 2019-09-24 MED ORDER — PENTAFLUOROPROP-TETRAFLUOROETH EX AERO: 1.0000 "application " | INHALATION_SPRAY | CUTANEOUS | Status: DC | PRN

## 2019-09-24 MED ORDER — LIDOCAINE HCL (PF) 1 % IJ SOLN: 5.0000 mL | INTRAMUSCULAR | Status: DC | PRN

## 2019-09-24 MED ORDER — SODIUM CHLORIDE 0.9 % IV SOLN: 100.0000 mL | INTRAVENOUS | Status: DC | PRN

## 2019-09-24 MED ORDER — WARFARIN SODIUM 7.5 MG PO TABS
7.5000 mg | ORAL_TABLET | Freq: Once | ORAL | Status: AC
  Administered 2019-09-24: 7.5 mg via ORAL
  Filled 2019-09-24: qty 1

## 2019-09-24 MED ORDER — LIDOCAINE-PRILOCAINE 2.5-2.5 % EX CREA: 1.0000 "application " | TOPICAL_CREAM | CUTANEOUS | Status: DC | PRN

## 2019-09-24 MED ORDER — HEPARIN SODIUM (PORCINE) 1000 UNIT/ML DIALYSIS: 40.0000 [IU]/kg | INTRAMUSCULAR | Status: DC | PRN

## 2019-09-24 MED ORDER — HEPARIN SODIUM (PORCINE) 1000 UNIT/ML DIALYSIS: 20.0000 [IU]/kg | INTRAMUSCULAR | Status: DC | PRN

## 2019-09-24 MED ORDER — ALTEPLASE 2 MG IJ SOLR: 2.0000 mg | Freq: Once | INTRAMUSCULAR | Status: DC | PRN

## 2019-09-24 MED ORDER — HEPARIN SODIUM (PORCINE) 1000 UNIT/ML DIALYSIS: 1000.0000 [IU] | INTRAMUSCULAR | Status: DC | PRN

## 2019-09-24 NOTE — Progress Notes (Signed)
ANTICOAGULATION CONSULT NOTE - Follow Up Consult  Pharmacy Consult for warfarin Indication: chest pain/ACS and atrial fibrillation  Allergies  Allergen Reactions  . Sulfa Antibiotics Hives  . Biaxin [Clarithromycin] Hives  . Buspar [Buspirone]   . Influenza A (H1n1) Monoval Vac Other (See Comments)    Pt states that she was told by her MD not to get the influenza vaccine.    . Morphine Other (See Comments)    Reaction:  Dizziness and confusion   . Pyridium [Phenazopyridine Hcl] Other (See Comments)    Reaction:  Unknown   . Tuberculin Tests   . Ceftriaxone Anxiety  . Latex Rash  . Prednisone Rash  . Tape Rash    Patient Measurements: Height: 5\' 7"  (170.2 cm) Weight: 287 lb 14.7 oz (130.6 kg) IBW/kg (Calculated) : 61.6 Heparin Dosing Weight: 91 kg  Vital Signs: Temp: 98.8 F (37.1 C) (12/07 0450) Temp Source: Oral (12/07 0450) BP: 117/39 (12/07 0450) Pulse Rate: 60 (12/07 0450)  Labs: Recent Labs    09/22/19 0431 09/23/19 0541 09/24/19 0504  LABPROT 21.2* 24.7* 27.3*  INR 1.9* 2.2* 2.5*  CREATININE 3.53* 4.01*  --     Estimated Creatinine Clearance: 16.8 mL/min (A) (by C-G formula based on SCr of 4.01 mg/dL (H)).   Assessment: 60 yof transferred from [facility] with atrial fibrillation and NSTEMI. No anticoagulation PTA, CHADSVASC 5. Positive troponins on admission with a peak of 2336. Of note, she was COVID + at her ALF 11/4. Noted hx GIB in 07/2018.  Heparin d/c'd on 12/3 per [facility]. INR trending up and is now therapeutic.  LFTs wnl. CBC stable. No significant drug interactions noted.    Goal of Therapy:  INR 2-3 Monitor platelets by anticoagulation protocol: Yes   Plan:  Repeat Warfarin 7.5mg  PO x 1 tonight Monitor daily INR, CBC, s/sx bleeding  Suan Pyeatt A. Levada Dy, PharmD, BCPS, FNKF Clinical Pharmacist [place] Please utilize Amion for appropriate phone number to reach the unit pharmacist ([place])    09/24/2019 8:23 AM

## 2019-09-24 NOTE — Procedures (Signed)
Patient seen on Hemodialysis. BP (!) 125/40 (BP Location: Left Arm)   Pulse (!) 54   Temp 99.4 F (37.4 C) (Oral)   Resp 16   Ht 5\' 7"  (1.702 m)   Wt 130.6 kg   SpO2 98%   BMI 45.09 kg/m   QB 400, UF goal 2.5L Tolerating treatment without complaints at this time.   Elmarie Shiley MD [facility]. Office # [phone] Pager # [phone] 3:46 PM

## 2019-09-24 NOTE — Progress Notes (Signed)
PROGRESS NOTE  JASMAIN AHLBERG GUY:[id number] DOB: November 09, 1940 DOA: 09/11/2019 PCP: System, Provider Not In  HPI/Recap of past 73 hours: 78 year old female with past medical history significant for end-stage renal disease on hemodialysis Monday Wednesday Friday via right IJ permacath, atrial flutter, chronic diastolic CHF, severe pulmonary hypertension, GI bleed, breast cancer status post right mastectomy who was transferred from [facility] hospital per nephrology recommendation to continue hemodialysis at [facility] in the setting of COVID-19 viral infection.  Diagnosed on 08/22/2019 when she was at assisted living facility.  At the time of her presentation to the ED, initial troponin S greater than 2300 with global ST depressions, 2-1 A-flutter on EKG.  Seen by cardiology at [facility] ED and started on beta-blocker and heparin drip.  Plan was to pursue cardiac catheterization however she became hypotensive in the emergency room and was transferred to [facility].  Admitted for treatment of COVID-19 viral pneumonia, non-ST elevation MI and for hemodialysis needs. Taken off isolation on 09/21/2019.  Awaiting SNF placement.  09/24/19: Seen and examined.  Somnolent but easily arousable.  States her rectal pain is improved on Preparation H.  Plan for HD today.   Assessment/Plan: Active Problems:   Respiratory tract infection due to COVID-19 virus  Treated COVID-19 viral pneumonia Per medical record initially tested positive with COVID-19 on 08/22/2019 O2 saturation 97% on 1.5 L. Maintain O2 saturation greater than 94% Continue albuterol inhaler as needed  NSTEMI Denies anginal symptoms at the time of this evaluation Had elevated troponin and abnormal EKG on admission Seen by cardiology, with follow-up in the future to consider ischemic evaluation. Continue aspirin statin and beta-blockers.  A flutter with RVR, permanent. Rate is controlled on beta-blocker On Coumadin for primary CVA  prevention INR 2.5 Coumadin managed by pharmacy.  Appreciate assistance.  Left lower extremity cellulitis, nonpurulent Completed course of ceftriaxone Clinically improved  Type 2 diabetes, well controlled  A1c 7.9 on 09/10/2019 Continue insulin coverage  Hypervolemic hyponatremia Sodium 129, trending down Volume management per nephrology  Chronic diastolic CHF 56-38% Mitral valve abnormal Management per cardiology Volume status management via hemodialysis  ESRD MWF Right IJ permacath Management per nephrology Plan HD today.  Rectal pain/nausea Continue symptomatic management Continue Preparation H Continue IV antiemetics as needed   DVT prophylaxis: Coumadin. Code Status: DNR Family Communication: None  Disposition Plan: Medically stable to go to a skilled nursing level of care before transferring to assisted living.    Objective: Vitals:   09/23/19 2056 09/24/19 0450 09/24/19 0856 09/24/19 0900  BP: (!) 138/38 (!) 117/39  (!) 125/40  Pulse: (!) 57 60 (!) 53 (!) 54  Resp: 20 16 18 16   Temp: 98 F (36.7 C) 98.8 F (37.1 C)  99.4 F (37.4 C)  TempSrc: Oral Oral  Oral  SpO2: 98% 95% 97% 98%  Weight:  130.6 kg    Height:        Intake/Output Summary (Last 24 hours) at 09/24/2019 1420 Last data filed at 09/24/2019 0900 Gross per 24 hour  Intake 360 ml  Output 350 ml  Net 10 ml   Filed Weights   09/21/19 2128 09/23/19 0100 09/24/19 0450  Weight: 117.3 kg 133.9 kg 130.6 kg    Exam:  . General: 78 y.o. year-old female well-developed well-nourished no acute distress.  Somnolent but easily arousable.   . Cardiovascular: Regular rate and rhythm no rubs or gallops. Marland Kitchen Respiratory: Clear consultation no wheezes or rales.   Abdomen: Obese nontender bowel sounds  present.  Musculoskeletal: 1+ pitting edema lower extremities bilaterally.   Psychiatry: Unable to assess mood due to somnolence.  Data Reviewed: CBC: Recent Labs  Lab 09/18/19 0322 09/20/19 0444  09/21/19 0135  WBC 11.2* 11.1* 10.4  HGB 9.7* 9.7* 9.1*  HCT 31.1* 29.9* 29.3*  MCV 92.3 89.8 93.3  PLT 300 257 237   Basic Metabolic Panel: Recent Labs  Lab 09/18/19 0322 09/19/19 0422 09/20/19 0444 09/21/19 0135 09/22/19 0431 09/23/19 0541  NA 131* 127* 130* 127* 130* 129*  K 4.8 4.9 4.8 5.2* 4.3 4.5  CL 94* 92* 94* 93* 92* 94*  CO2 19* 21* 25 21* 23 23  GLUCOSE 282* 294* 311* 325* 245* 108*  BUN 48* 68* 35* 50* 38* 52*  CREATININE 4.92* 5.52* 3.66* 4.61* 3.53* 4.01*  CALCIUM 8.5* 9.2 8.3* 9.3 8.9 8.7*  MG 1.9  --   --   --   --   --   PHOS 3.8 4.5 3.9 4.9* 3.5 3.8   GFR: Estimated Creatinine Clearance: 16.8 mL/min (A) (by C-G formula based on SCr of 4.01 mg/dL (H)). Liver Function Tests: Recent Labs  Lab 09/19/19 0422 09/20/19 0444 09/21/19 0135 09/22/19 0431 09/23/19 0541  ALBUMIN 2.7* 2.7* 2.8* 3.0* 2.7*   No results for input(s): LIPASE, AMYLASE in the last 168 hours. No results for input(s): AMMONIA in the last 168 hours. Coagulation Profile: Recent Labs  Lab 09/20/19 0444 09/21/19 0135 09/22/19 0431 09/23/19 0541 09/24/19 0504  INR 1.3* 1.5* 1.9* 2.2* 2.5*   Cardiac Enzymes: No results for input(s): CKTOTAL, CKMB, CKMBINDEX, TROPONINI in the last 168 hours. BNP (last 3 results) No results for input(s): PROBNP in the last 8760 hours. HbA1C: No results for input(s): HGBA1C in the last 72 hours. CBG: Recent Labs  Lab 09/23/19 1104 09/23/19 1618 09/23/19 2050 09/24/19 0734 09/24/19 1133  GLUCAP 138* 252* 178* 132* 146*   Lipid Profile: No results for input(s): CHOL, HDL, LDLCALC, TRIG, CHOLHDL, LDLDIRECT in the last 72 hours. Thyroid Function Tests: No results for input(s): TSH, T4TOTAL, FREET4, T3FREE, THYROIDAB in the last 72 hours. Anemia Panel: No results for input(s): VITAMINB12, FOLATE, FERRITIN, TIBC, IRON, RETICCTPCT in the last 72 hours. Urine analysis:    Component Value Date/Time   COLORURINE AMBER (A) 07/03/2019 0600    APPEARANCEUR CLOUDY (A) 07/03/2019 0600   APPEARANCEUR Cloudy (A) 01/16/2018 1349   LABSPEC 1.020 07/03/2019 0600   LABSPEC 1.008 09/08/2014 1719   PHURINE 5.0 07/03/2019 0600   GLUCOSEU NEGATIVE 07/03/2019 0600   GLUCOSEU Negative 09/08/2014 1719   HGBUR NEGATIVE 07/03/2019 0600   BILIRUBINUR NEGATIVE 07/03/2019 0600   BILIRUBINUR Negative 01/16/2018 1349   BILIRUBINUR Negative 09/08/2014 1719   KETONESUR NEGATIVE 07/03/2019 0600   PROTEINUR 100 (A) 07/03/2019 0600   NITRITE NEGATIVE 07/03/2019 0600   LEUKOCYTESUR LARGE (A) 07/03/2019 0600   LEUKOCYTESUR 3+ 09/08/2014 1719   Sepsis Labs: @LABRCNTIP (procalcitonin:4,lacticidven:4)  )No results found for this or any previous visit (from the past 240 hour(s)).    Studies: No results found.  Scheduled Meds: . atorvastatin  40 mg Oral q1800  . calcium acetate  1,334 mg Oral TID AC  . Chlorhexidine Gluconate Cloth  6 each Topical Q0600  . citalopram  10 mg Oral Daily  . docusate sodium  100 mg Oral BID  . feeding supplement (GLUCERNA SHAKE)  237 mL Oral TID BM  . insulin aspart  0-15 Units Subcutaneous TID AC & HS  . insulin aspart  10 Units Subcutaneous TID WC  .  insulin glargine  22 Units Subcutaneous QHS  . lamoTRIgine  25 mg Oral Daily  . levETIRAcetam  250 mg Oral BID  . mouth rinse  15 mL Mouth Rinse BID  . metoprolol tartrate  75 mg Oral BID  . mirabegron ER  25 mg Oral Daily  . OLANZapine  5 mg Oral QHS  . pantoprazole  40 mg Oral Daily  . senna  1 tablet Oral BID  . sodium chloride flush  3 mL Intravenous Q12H  . torsemide  40 mg Oral Once per day on Sun Tue Thu Sat  . vitamin C  500 mg Oral Daily  . warfarin  7.5 mg Oral ONCE-1800  . Warfarin - Pharmacist Dosing Inpatient   Does not apply q1800  . zinc sulfate  220 mg Oral Daily    Continuous Infusions: . sodium chloride    . sodium chloride       LOS: 13 days     Kayleen Memos, MD [facility] Pager [phone]  If 7PM-7AM, please contact  night-coverage [email or web address] Password [email or web address] 09/24/2019, 2:20 PM

## 2019-09-24 NOTE — Progress Notes (Signed)
Patient ID: Carolyn Valdez, female   DOB: [birth year], 78 y.o.   MRN: [id number] [facility] Progress Note   Assessment/ Plan:   1.  COVID-19 infection: Respiratory status back to baseline following remdesivir/corticosteroids.  Course complicated by non-STEMI that is ongoing medical management. 2. ESRD: We will get hemodialysis today to continue usual outpatient M/W/F schedule; continue efforts at lowering dry weight. 3. Anemia: Status post ESA, no overt blood loss.  Monitor with hemodialysis/UF. 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 5.  Atrial flutter/atrial fibrillation with RVR: With RVR noted overnight, continue beta-blocker.  On Coumadin. 6. Hypertension: Blood pressure under acceptable control, continue to monitor with hemodialysis/current medications. 7.  Disposition: Awaiting nursing facility placement around [place].  We will need to explore whether she can go back to her previous bed at [facility] as she is now Covid negative.  Subjective:   Feeling fatigued this morning and does not want to eat breakfast.  Denies any chest pain or shortness of breath.   Objective:   BP (!) 117/39 (BP Location: Left Arm)   Pulse 60   Temp 98.8 F (37.1 C) (Oral)   Resp 16   Ht 5\' 7"  (1.702 m)   Wt 130.6 kg   SpO2 95%   BMI 45.09 kg/m   Physical Exam: Gen: Appears fatigued resting in bed, awakens and engaging in conversation CVS: Pulse regular bradycardia, S1 and S2 normal Resp: Diminished breath sounds over bases, poor inspiratory effort.  No rales/rhonchi.  Right IJ TDC Abd: Soft, obese, nontender Ext: Trace lower extremity edema  Labs: BMET Recent Labs  Lab 09/18/19 0322 09/19/19 0422 09/20/19 0444 09/21/19 0135 09/22/19 0431 09/23/19 0541  NA 131* 127* 130* 127* 130* 129*  K 4.8 4.9 4.8 5.2* 4.3 4.5  CL 94* 92* 94* 93* 92* 94*  CO2 19* 21* 25 21* 23 23  GLUCOSE 282* 294* 311* 325* 245* 108*   BUN 48* 68* 35* 50* 38* 52*  CREATININE 4.92* 5.52* 3.66* 4.61* 3.53* 4.01*  CALCIUM 8.5* 9.2 8.3* 9.3 8.9 8.7*  PHOS 3.8 4.5 3.9 4.9* 3.5 3.8   CBC Recent Labs  Lab 09/18/19 0322 09/20/19 0444 09/21/19 0135  WBC 11.2* 11.1* 10.4  HGB 9.7* 9.7* 9.1*  HCT 31.1* 29.9* 29.3*  MCV 92.3 89.8 93.3  PLT 300 257 249   Medications:    . atorvastatin  40 mg Oral q1800  . calcium acetate  1,334 mg Oral TID AC  . Chlorhexidine Gluconate Cloth  6 each Topical Q0600  . citalopram  10 mg Oral Daily  . docusate sodium  100 mg Oral BID  . feeding supplement (GLUCERNA SHAKE)  237 mL Oral TID BM  . insulin aspart  0-15 Units Subcutaneous TID AC & HS  . insulin aspart  10 Units Subcutaneous TID WC  . insulin glargine  22 Units Subcutaneous QHS  . lamoTRIgine  25 mg Oral Daily  . levETIRAcetam  250 mg Oral BID  . mouth rinse  15 mL Mouth Rinse BID  . metoprolol tartrate  75 mg Oral BID  . mirabegron ER  25 mg Oral Daily  . OLANZapine  5 mg Oral QHS  . pantoprazole  40 mg Oral Daily  . senna  1 tablet Oral BID  . sodium chloride flush  3 mL Intravenous Q12H  . torsemide  40 mg Oral Once per day on Sun Tue Thu Sat  . vitamin C  500 mg  Oral Daily  . warfarin  7.5 mg Oral ONCE-1800  . Warfarin - Pharmacist Dosing Inpatient   Does not apply q1800  . zinc sulfate  220 mg Oral Daily   Elmarie Shiley, MD 09/24/2019, 9:02 AM

## 2019-09-25 LAB — GLUCOSE, CAPILLARY
Glucose-Capillary: 175 mg/dL — ABNORMAL HIGH (ref 70–99)
Glucose-Capillary: 175 mg/dL — ABNORMAL HIGH (ref 70–99)
Glucose-Capillary: 206 mg/dL — ABNORMAL HIGH (ref 70–99)
Glucose-Capillary: 211 mg/dL — ABNORMAL HIGH (ref 70–99)
Glucose-Capillary: 241 mg/dL — ABNORMAL HIGH (ref 70–99)

## 2019-09-25 LAB — PROTIME-INR
INR: 2.5 — ABNORMAL HIGH (ref 0.8–1.2)
Prothrombin Time: 27.1 seconds — ABNORMAL HIGH (ref 11.4–15.2)

## 2019-09-25 LAB — SARS CORONAVIRUS 2 (TAT 6-24 HRS): SARS Coronavirus 2: NEGATIVE

## 2019-09-25 MED ORDER — WARFARIN SODIUM 7.5 MG PO TABS
7.5000 mg | ORAL_TABLET | Freq: Once | ORAL | Status: AC
  Administered 2019-09-25: 7.5 mg via ORAL
  Filled 2019-09-25: qty 1

## 2019-09-25 NOTE — Progress Notes (Signed)
Renal Navigator received request to see if patient could be transferred to [facility] clinic's isolation shift if needed to accommodate SNF in [place]. Renal Navigator spoke with Carolyn Valdez and unfortunately, at this point, [facility] is unable to accommodate patient's from other companies due to the volume of COVID positive [facility] patients who are already requiring treatment at [facility]. She has agreed to notify Renal Navigator if there comes a point in time where [facility] can take transient patients to [facility] for treatment at the isolation shift from other HD companies. CSW updated.  Carolyn Valdez, [place] Renal Navigator [phone]

## 2019-09-25 NOTE — Progress Notes (Signed)
ANTICOAGULATION CONSULT NOTE - Follow Up Consult  Pharmacy Consult for warfarin Indication: chest pain/ACS and atrial fibrillation  Allergies  Allergen Reactions  . Sulfa Antibiotics Hives  . Biaxin [Clarithromycin] Hives  . Buspar [Buspirone]   . Influenza A (H1n1) Mon[place] Vac Other (See Comments)    Pt states that she was told by her MD not to get the influenza vaccine.    . Morphine Other (See Comments)    Reaction:  Dizziness and confusion   . Pyridium [Phenazopyridine Hcl] Other (See Comments)    Reaction:  Unknown   . Tuberculin Tests   . Ceftriaxone Anxiety  . Latex Rash  . Prednisone Rash  . Tape Rash    Patient Measurements: Height: 5\' 7"  (170.2 cm) Weight: 287 lb 14.7 oz (130.6 kg) IBW/kg (Calculated) : 61.6 Heparin Dosing Weight: 91 kg  Vital Signs: Temp: 99.6 F (37.6 C) (12/08 0429) Temp Source: Oral (12/08 0429) BP: 126/39 (12/08 0429) Pulse Rate: 64 (12/08 0429)  Labs: Recent Labs    09/23/19 0541 09/24/19 0504 09/25/19 0620  LABPROT 24.7* 27.3* 27.1*  INR 2.2* 2.5* 2.5*  CREATININE 4.01*  --   --     Estimated Creatinine Clearance: 16.8 mL/min (A) (by C-G formula based on SCr of 4.01 mg/dL (H)).   Assessment: 13 yof transferred from [facility] with atrial fibrillation and NSTEMI. No anticoagulation PTA, CHADSVASC 5. Positive troponins on admission with a peak of 2336. Of note, she was COVID + at her ALF 11/4. Noted hx GIB in 07/2018.  Heparin d/c'd on 12/3 per [facility]. INR stable at 2.5 and is now therapeutic.  LFTs wnl. CBC stable. No significant drug interactions noted.    Goal of Therapy:  INR 2-3 Monitor platelets by anticoagulation protocol: Yes   Plan:  Repeat Warfarin 7.5mg  PO x 1 tonight Monitor daily INR, CBC, s/sx bleeding  Virgilia Quigg A. Levada Dy, PharmD, BCPS, FNKF Clinical Pharmacist [place] Please utilize Amion for appropriate phone number to reach the unit pharmacist ([place])    09/25/2019 7:44 AM

## 2019-09-25 NOTE — TOC Progression Note (Signed)
Transition of Care [facility]) - Progression Note    Patient Details  Name: ARLETTE SCHAAD MRN: [id number] Date of Birth: [birth year]  Transition of [facility]) CM/SW Contact  Sharlet Salina Mila Homer, LCSW Phone Number: 09/25/2019, 5:21 PM  Clinical Narrative:  Facility search initiated and no bed offers for facilities in [place] and 1 bed offer in [facility]. [facility] declined but commented that they would consider when COVID resolved. Call made to [facility] and talked with admissions director Neoma Laming regarding patient and provided dialysis information ([facility] in [place]. Chair time 11:15 and must arrive by 11:00 am). CSW advised that they are currently not in a position to take another dialysis patient. Spoke with admissions director Olivia Mackie at [facility] regarding patient and was advised that they can't take any dialysis patient at this time as the facility driver can't transport any more HD patients and they are having problems with their contract transportation. Consulted with Terri Piedra, Dialysis Coordinator regarding patient and she will do what she can regarding HD placement if a facility can be located that will accept patient for short-term rehab.    CSW talked with patient by phone (4:55 pm) and provided update regarding locating a skilled nursing facility for ST rehab and her continued need for rehab based on therapy notes. Ms. Rathel requested that [place] talked with the nursing director her [place] facility and was informed that this will be done. CSW talked with attending MD, provided an update and requested another COVID test as this may open up the options for SNF and HD placement, if the HD center needs to be temporarily changed for rehab.        Expected Discharge Plan: Skilled Nursing Facility Barriers to Discharge: Other (comment)(COVID positive)  Expected Discharge Plan and Services Expected Discharge Plan: [place] In-house Referral: Clinical Social Work   Post Acute Care Choice: Nursing Home Living arrangements for the past 2 months: [place]                                     Social Determinants of Health (SDOH) Interventions  No SDOH interventions needed at this time.  Readmission Risk Interventions Readmission Risk Prevention Plan 07/04/2019 03/19/2019  Transportation Screening Complete Complete  Medication Review Press photographer) Complete Complete  PCP or Specialist appointment within 3-5 days of discharge Complete Complete  HRI or Home Care Consult Complete Complete  SW Recovery Care/Counseling Consult Complete Not Complete  SW Consult Not Complete Comments - na  Palliative Care Screening Not Applicable Not [place] Patient Refused Not Applicable  Some recent data might be hidden

## 2019-09-25 NOTE — Plan of Care (Signed)
  Problem: Education: Goal: Knowledge of General Education information will improve Description: Including pain rating scale, medication(s)/side effects and non-pharmacologic comfort measures Outcome: Progressing   Problem: Elimination: Goal: Will not experience complications related to bowel motility Outcome: Progressing   Problem: Skin Integrity: Goal: Risk for impaired skin integrity will decrease Outcome: Progressing   

## 2019-09-25 NOTE — Progress Notes (Signed)
PROGRESS NOTE  Carolyn Valdez BDZ:[id number] DOB: 1941-10-08 DOA: 09/11/2019 PCP: System, Provider Not In  HPI/Recap of past 34 hours: 78 year old female with past medical history significant for end-stage renal disease on hemodialysis Monday Wednesday Friday via right IJ permacath, atrial flutter, chronic diastolic CHF, severe pulmonary hypertension, GI bleed, breast cancer status post right mastectomy who was transferred from [facility] hospital per nephrology recommendation to continue hemodialysis at [facility] in the setting of COVID-19 viral infection.  Diagnosed on 08/22/2019 when she was at assisted living facility.  At the time of her presentation to the ED, initial troponin S greater than 2300 with global ST depressions, 2-1 A-flutter on EKG.  Seen by cardiology at [facility] ED and started on beta-blocker and heparin drip.  Plan was to pursue cardiac catheterization however she became hypotensive in the emergency room and was transferred to [facility].  Admitted for treatment of COVID-19 viral pneumonia, non-ST elevation MI and for hemodialysis needs. Taken off isolation on 09/21/2019.  Awaiting SNF placement.  09/25/19: Seen and examined.  Reports rectal pain and states she has not used her rectal ointment.   Assessment/Plan: Active Problems:   Respiratory tract infection due to COVID-19 virus  Treated COVID-19 viral pneumonia Per medical record initially tested positive with COVID-19 on 08/22/2019 O2 saturation 97% on 1.5 L. Maintain O2 saturation greater than 94% Continue albuterol inhaler as needed  Acute on chronic hypoxic respiratory failure On 3L [place] at baseline. She is back to her baseline oxygen requirement. O2 saturation 96-100% on 3 L  NSTEMI Denies anginal symptoms at the time of this evaluation Had elevated troponin and abnormal EKG on admission Seen by cardiology, with follow-up in the future to consider ischemic evaluation. Continue aspirin statin and  beta-blockers.  A flutter with RVR, permanent. Rate is controlled on beta-blocker On Coumadin for primary CVA prevention INR 2.5 Coumadin managed by pharmacy.  Appreciate assistance.  Left lower extremity cellulitis, nonpurulent Completed course of ceftriaxone Clinically improved  Type 2 diabetes, well controlled  A1c 7.9 on 09/10/2019 Continue insulin coverage  Hypervolemic hyponatremia Sodium 129, trending down Volume management per nephrology  Chronic diastolic CHF 34-19% Mitral valve abnormal Management per cardiology Volume status management via hemodialysis  ESRD MWF Right IJ permacath Management per nephrology Plan HD today.  Rectal pain/nausea Continue symptomatic management Continue Preparation H Continue IV antiemetics as needed  Severe obesity BMI 45 Recommend weight loss outpatient with regular physical activity and healthy dieting   DVT prophylaxis: Coumadin. Code Status: DNR Family Communication: None  Disposition Plan: Medically stable to go to a skilled nursing level of care before transferring to assisted living.    Objective: Vitals:   09/24/19 1720 09/24/19 2114 09/25/19 0429 09/25/19 0837  BP: (!) 114/33 110/74 (!) 126/39 (!) 133/43  Pulse: (!) 55 (!) 138 64 63  Resp: 18 18 16 18   Temp: 98.6 F (37 C) 98.7 F (37.1 C) 99.6 F (37.6 C) 99 F (37.2 C)  TempSrc: Oral Oral Oral Oral  SpO2: 99% 99% 100% 96%  Weight:      Height:        Intake/Output Summary (Last 24 hours) at 09/25/2019 1244 Last data filed at 09/25/2019 0900 Gross per 24 hour  Intake 240 ml  Output 400 ml  Net -160 ml   Filed Weights   09/21/19 2128 09/23/19 0100 09/24/19 0450  Weight: 117.3 kg 133.9 kg 130.6 kg    Exam:  . General: 78 y.o. year-old female obese  in no acute distress.  Alert and oriented x3. . Cardiovascular: Regular rate and rhythm with no rubs or gallops.Marland Kitchen Respiratory: Clear to auscultation no wheezes or rales.   Abdomen: Obese nontender  bowel sounds present. Musculoskeletal: Trace edema in lower extremities bilaterally. Psychiatry: Mood is appropriate for condition setting.  Data Reviewed: CBC: Recent Labs  Lab 09/20/19 0444 09/21/19 0135  WBC 11.1* 10.4  HGB 9.7* 9.1*  HCT 29.9* 29.3*  MCV 89.8 93.3  PLT 257 161   Basic Metabolic Panel: Recent Labs  Lab 09/19/19 0422 09/20/19 0444 09/21/19 0135 09/22/19 0431 09/23/19 0541  NA 127* 130* 127* 130* 129*  K 4.9 4.8 5.2* 4.3 4.5  CL 92* 94* 93* 92* 94*  CO2 21* 25 21* 23 23  GLUCOSE 294* 311* 325* 245* 108*  BUN 68* 35* 50* 38* 52*  CREATININE 5.52* 3.66* 4.61* 3.53* 4.01*  CALCIUM 9.2 8.3* 9.3 8.9 8.7*  PHOS 4.5 3.9 4.9* 3.5 3.8   GFR: Estimated Creatinine Clearance: 16.8 mL/min (A) (by C-G formula based on SCr of 4.01 mg/dL (H)). Liver Function Tests: Recent Labs  Lab 09/19/19 0422 09/20/19 0444 09/21/19 0135 09/22/19 0431 09/23/19 0541  ALBUMIN 2.7* 2.7* 2.8* 3.0* 2.7*   No results for input(s): LIPASE, AMYLASE in the last 168 hours. No results for input(s): AMMONIA in the last 168 hours. Coagulation Profile: Recent Labs  Lab 09/21/19 0135 09/22/19 0431 09/23/19 0541 09/24/19 0504 09/25/19 0620  INR 1.5* 1.9* 2.2* 2.5* 2.5*   Cardiac Enzymes: No results for input(s): CKTOTAL, CKMB, CKMBINDEX, TROPONINI in the last 168 hours. BNP (last 3 results) No results for input(s): PROBNP in the last 8760 hours. HbA1C: No results for input(s): HGBA1C in the last 72 hours. CBG: Recent Labs  Lab 09/24/19 1133 09/24/19 1716 09/24/19 2112 09/25/19 0642 09/25/19 1137  GLUCAP 146* 88 223* 175* 206*   Lipid Profile: No results for input(s): CHOL, HDL, LDLCALC, TRIG, CHOLHDL, LDLDIRECT in the last 72 hours. Thyroid Function Tests: No results for input(s): TSH, T4TOTAL, FREET4, T3FREE, THYROIDAB in the last 72 hours. Anemia Panel: No results for input(s): VITAMINB12, FOLATE, FERRITIN, TIBC, IRON, RETICCTPCT in the last 72 hours. Urine  analysis:    Component Value Date/Time   COLORURINE AMBER (A) 07/03/2019 0600   APPEARANCEUR CLOUDY (A) 07/03/2019 0600   APPEARANCEUR Cloudy (A) 01/16/2018 1349   LABSPEC 1.020 07/03/2019 0600   LABSPEC 1.008 09/08/2014 1719   PHURINE 5.0 07/03/2019 0600   GLUCOSEU NEGATIVE 07/03/2019 0600   GLUCOSEU Negative 09/08/2014 1719   HGBUR NEGATIVE 07/03/2019 0600   BILIRUBINUR NEGATIVE 07/03/2019 0600   BILIRUBINUR Negative 01/16/2018 1349   BILIRUBINUR Negative 09/08/2014 1719   KETONESUR NEGATIVE 07/03/2019 0600   PROTEINUR 100 (A) 07/03/2019 0600   NITRITE NEGATIVE 07/03/2019 0600   LEUKOCYTESUR LARGE (A) 07/03/2019 0600   LEUKOCYTESUR 3+ 09/08/2014 1719   Sepsis Labs: @LABRCNTIP (procalcitonin:4,lacticidven:4)  )No results found for this or any previous visit (from the past 240 hour(s)).    Studies: No results found.  Scheduled Meds: . atorvastatin  40 mg Oral q1800  . calcium acetate  1,334 mg Oral TID AC  . Chlorhexidine Gluconate Cloth  6 each Topical Q0600  . citalopram  10 mg Oral Daily  . docusate sodium  100 mg Oral BID  . feeding supplement (GLUCERNA SHAKE)  237 mL Oral TID BM  . insulin aspart  0-15 Units Subcutaneous TID AC & HS  . insulin aspart  10 Units Subcutaneous TID WC  . insulin glargine  22 Units Subcutaneous QHS  . lamoTRIgine  25 mg Oral Daily  . levETIRAcetam  250 mg Oral BID  . mouth rinse  15 mL Mouth Rinse BID  . metoprolol tartrate  75 mg Oral BID  . mirabegron ER  25 mg Oral Daily  . OLANZapine  5 mg Oral QHS  . pantoprazole  40 mg Oral Daily  . senna  1 tablet Oral BID  . sodium chloride flush  3 mL Intravenous Q12H  . torsemide  40 mg Oral Once per day on Sun Tue Thu Sat  . vitamin C  500 mg Oral Daily  . warfarin  7.5 mg Oral ONCE-1800  . Warfarin - Pharmacist Dosing Inpatient   Does not apply q1800  . zinc sulfate  220 mg Oral Daily    Continuous Infusions:    LOS: 14 days     Kayleen Memos, MD [facility] Pager  [phone]  If 7PM-7AM, please contact night-coverage [email or web address] Password [facility] 09/25/2019, 12:44 PM

## 2019-09-25 NOTE — Progress Notes (Signed)
Patient ID: Carolyn Valdez, female   DOB: [birth year], 78 y.o.   MRN: [id number] [facility] Progress Note   Assessment/ Plan:   1.  COVID-19 infection: Respiratory status back to baseline following remdesivir/corticosteroids.  Course complicated by non-STEMI that is ongoing medical management. 2. ESRD: Next hemodialysis tomorrow to continue her on her usual Monday/Wednesday/Friday schedule.  Lower EDW. 3. Anemia: Status post ESA, no overt blood loss.  Monitor with hemodialysis/UF. 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 5.  Atrial flutter/atrial fibrillation with RVR: With RVR noted overnight, continue beta-blocker.  On Coumadin. 6. Hypertension: Blood pressure under acceptable control, continue to monitor with hemodialysis/current medications. 7.  Disposition: Awaiting nursing facility placement around [place].  We will need to explore whether she can go back to her previous bed at [facility] as she is now Covid negative.  Subjective:   Reports that she slept poorly overnight because of "anxiety" and spasms in her anal/rectal area.   Objective:   BP (!) 133/43 (BP Location: Left Arm)   Pulse 63   Temp 99 F (37.2 C) (Oral)   Resp 18   Ht 5\' 7"  (1.702 m)   Wt 130.6 kg   SpO2 96%   BMI 45.09 kg/m   Physical Exam: Gen: Appears comfortable resting in bed CVS: Pulse regular rhythm, normal rate, S1 and S2 normal Resp: Anteriorly clear to auscultation.  No rales/rhonchi.  Right IJ TDC Abd: Soft, obese, nontender Ext: Trace lower extremity edema  Labs: BMET Recent Labs  Lab 09/19/19 0422 09/20/19 0444 09/21/19 0135 09/22/19 0431 09/23/19 0541  NA 127* 130* 127* 130* 129*  K 4.9 4.8 5.2* 4.3 4.5  CL 92* 94* 93* 92* 94*  CO2 21* 25 21* 23 23  GLUCOSE 294* 311* 325* 245* 108*  BUN 68* 35* 50* 38* 52*  CREATININE 5.52* 3.66* 4.61* 3.53* 4.01*  CALCIUM 9.2 8.3* 9.3 8.9 8.7*  PHOS 4.5 3.9 4.9* 3.5  3.8   CBC Recent Labs  Lab 09/20/19 0444 09/21/19 0135  WBC 11.1* 10.4  HGB 9.7* 9.1*  HCT 29.9* 29.3*  MCV 89.8 93.3  PLT 257 249   Medications:    . atorvastatin  40 mg Oral q1800  . calcium acetate  1,334 mg Oral TID AC  . Chlorhexidine Gluconate Cloth  6 each Topical Q0600  . citalopram  10 mg Oral Daily  . docusate sodium  100 mg Oral BID  . feeding supplement (GLUCERNA SHAKE)  237 mL Oral TID BM  . insulin aspart  0-15 Units Subcutaneous TID AC & HS  . insulin aspart  10 Units Subcutaneous TID WC  . insulin glargine  22 Units Subcutaneous QHS  . lamoTRIgine  25 mg Oral Daily  . levETIRAcetam  250 mg Oral BID  . mouth rinse  15 mL Mouth Rinse BID  . metoprolol tartrate  75 mg Oral BID  . mirabegron ER  25 mg Oral Daily  . OLANZapine  5 mg Oral QHS  . pantoprazole  40 mg Oral Daily  . senna  1 tablet Oral BID  . sodium chloride flush  3 mL Intravenous Q12H  . torsemide  40 mg Oral Once per day on Sun Tue Thu Sat  . vitamin C  500 mg Oral Daily  . warfarin  7.5 mg Oral ONCE-1800  . Warfarin - Pharmacist Dosing Inpatient   Does not apply q1800  . zinc sulfate  220 mg Oral Daily  Elmarie Shiley, MD 09/25/2019, 9:18 AM

## 2019-09-25 NOTE — Progress Notes (Signed)
PT Cancellation Note  Patient Details Name: Carolyn Valdez MRN: [id number] DOB: 09-Jun-1941   Cancelled Treatment:    Reason Eval/Treat Not Completed: Other (comment). Pt reported she didn't sleep last night and wanted to rest. Explained importance of mobility and encouraged her to participate. She continued to defer. Will continue attempts.    Shary Decamp [address] [facility] 09/25/2019, 4:22 PM [facility] Pager [phone] Office [phone]

## 2019-09-26 LAB — PROTIME-INR
INR: 2.3 — ABNORMAL HIGH (ref 0.8–1.2)
Prothrombin Time: 25.3 seconds — ABNORMAL HIGH (ref 11.4–15.2)

## 2019-09-26 LAB — RENAL FUNCTION PANEL
Albumin: 2.6 g/dL — ABNORMAL LOW (ref 3.5–5.0)
Anion gap: 11 (ref 5–15)
BUN: 36 mg/dL — ABNORMAL HIGH (ref 8–23)
CO2: 27 mmol/L (ref 22–32)
Calcium: 8.6 mg/dL — ABNORMAL LOW (ref 8.9–10.3)
Chloride: 90 mmol/L — ABNORMAL LOW (ref 98–111)
Creatinine, Ser: 4.38 mg/dL — ABNORMAL HIGH (ref 0.44–1.00)
GFR calc Af Amer: 11 mL/min — ABNORMAL LOW (ref >60)
GFR calc non Af Amer: 9 mL/min — ABNORMAL LOW (ref >60)
Glucose, Bld: 212 mg/dL — ABNORMAL HIGH (ref 70–99)
Phosphorus: 3 mg/dL (ref 2.5–4.6)
Potassium: 4.1 mmol/L (ref 3.5–5.1)
Sodium: 128 mmol/L — ABNORMAL LOW (ref 135–145)

## 2019-09-26 LAB — CBC
HCT: 27.7 % — ABNORMAL LOW (ref 36.0–46.0)
Hemoglobin: 8.5 g/dL — ABNORMAL LOW (ref 12.0–15.0)
MCH: 28.9 pg (ref 26.0–34.0)
MCHC: 30.7 g/dL (ref 30.0–36.0)
MCV: 94.2 fL (ref 80.0–100.0)
Platelets: 199 10*3/uL (ref 150–400)
RBC: 2.94 MIL/uL — ABNORMAL LOW (ref 3.87–5.11)
RDW: 15.6 % — ABNORMAL HIGH (ref 11.5–15.5)
WBC: 7.1 10*3/uL (ref 4.0–10.5)
nRBC: 0 % (ref 0.0–0.2)

## 2019-09-26 LAB — URINALYSIS, ROUTINE W REFLEX MICROSCOPIC
Bilirubin Urine: NEGATIVE
Glucose, UA: NEGATIVE mg/dL
Hgb urine dipstick: NEGATIVE
Ketones, ur: NEGATIVE mg/dL
Nitrite: NEGATIVE
Protein, ur: 100 mg/dL — AB
Specific Gravity, Urine: 1.016 (ref 1.005–1.030)
Squamous Epithelial / HPF: >50 — ABNORMAL HIGH (ref 0–5)
WBC, UA: >50 WBC/hpf — ABNORMAL HIGH (ref 0–5)
pH: 5 (ref 5.0–8.0)

## 2019-09-26 LAB — GLUCOSE, CAPILLARY
Glucose-Capillary: 205 mg/dL — ABNORMAL HIGH (ref 70–99)
Glucose-Capillary: 194 mg/dL — ABNORMAL HIGH (ref 70–99)
Glucose-Capillary: 254 mg/dL — ABNORMAL HIGH (ref 70–99)
Glucose-Capillary: 233 mg/dL — ABNORMAL HIGH (ref 70–99)

## 2019-09-26 MED ORDER — HEPARIN SODIUM (PORCINE) 1000 UNIT/ML IJ SOLN
INTRAMUSCULAR | Status: AC
  Filled 2019-09-26: qty 5

## 2019-09-26 MED ORDER — HEPARIN SODIUM (PORCINE) 1000 UNIT/ML DIALYSIS
40.0000 [IU]/kg | INTRAMUSCULAR | Status: DC | PRN
  Administered 2019-09-26: 5200 [IU] via INTRAVENOUS_CENTRAL

## 2019-09-26 MED ORDER — HEPARIN SODIUM (PORCINE) 1000 UNIT/ML IJ SOLN
INTRAMUSCULAR | Status: AC
  Filled 2019-09-26: qty 4

## 2019-09-26 MED ORDER — WARFARIN SODIUM 7.5 MG PO TABS
7.5000 mg | ORAL_TABLET | Freq: Once | ORAL | Status: AC
  Administered 2019-09-26: 7.5 mg via ORAL
  Filled 2019-09-26: qty 1

## 2019-09-26 NOTE — Progress Notes (Signed)
PROGRESS NOTE  COURTENEY ALDERETE Valdez:[id number] DOB: [birth year] DOA: 09/11/2019 PCP: System, Provider Not In  HPI/Recap of past 16 hours: 78 year old female with past medical history significant for end-stage renal disease on hemodialysis Monday Wednesday Friday via right IJ permacath, atrial flutter, chronic diastolic CHF, severe pulmonary hypertension, GI bleed, breast cancer status post right mastectomy who was transferred from [facility] hospital per nephrology recommendation to continue hemodialysis at [facility] in the setting of COVID-19 viral infection.  Diagnosed on 08/22/2019 when she was at assisted living facility.  At the time of her presentation to the ED, initial troponin S greater than 2300 with global ST depressions, 2-1 A-flutter on EKG.  Seen by cardiology at [facility] ED and started on beta-blocker and heparin drip.  Plan was to pursue cardiac catheterization however she became hypotensive in the emergency room and was transferred to [facility].  Admitted for treatment of COVID-19 viral pneumonia, non-ST elevation MI and for hemodialysis needs. Taken off isolation on 09/21/2019.  Awaiting SNF placement.  09/26/19: Seen and examined in the HD center. States she has been urinating more. Also reports dysuria. Will obtain urine sample.   Assessment/Plan: Active Problems:   Respiratory tract infection due to COVID-19 virus  Treated COVID-19 viral pneumonia Per medical record initially tested positive with COVID-19 on 08/22/2019 O2 saturation 97% on 1.5 L. Maintain O2 saturation greater than 94% Continue albuterol inhaler as needed  Acute on chronic hypoxic respiratory failure On 3L [place] at baseline. She is back to her baseline oxygen requirement. O2 saturation 96-100% on 3 L  NSTEMI Denies anginal symptoms at the time of this evaluation Had elevated troponin and abnormal EKG on admission Seen by cardiology, with follow-up in the future to consider ischemic  evaluation. Continue aspirin statin and beta-blockers.  A flutter with RVR, permanent. Rate is controlled on beta-blocker On Coumadin for primary CVA prevention INR 2.5 Coumadin managed by pharmacy.  Appreciate assistance.  LUTS Reports increase urinary frequency and dysuria Obtain UA and urine CX.  Left lower extremity cellulitis, nonpurulent Completed course of ceftriaxone Clinically improved  Type 2 diabetes, well controlled  A1c 7.9 on 09/10/2019 Continue insulin coverage  Hypervolemic hyponatremia Sodium 129, trending down Volume management per nephrology  Chronic diastolic CHF 86-57% Mitral valve abnormal Management per cardiology Volume status management via hemodialysis  ESRD MWF Right IJ permacath Management per nephrology HD today  Hypervolemic hyponatremia Sodium 128 Volume managed with hemodialysis  Resolving Rectal pain/nausea Continue symptomatic management Continue Preparation H Continue IV antiemetics as needed  Severe obesity BMI 45 Recommend weight loss outpatient with regular physical activity and healthy dieting   DVT prophylaxis: Coumadin. Code Status: DNR Family Communication: None  Disposition Plan: Medically stable to go to a skilled nursing level of care before transferring to assisted living.    Objective: Vitals:   09/26/19 0900 09/26/19 0930 09/26/19 1000 09/26/19 1030  BP: (!) 145/52 (!) 153/53 136/60 (!) 150/58  Pulse: (!) 55 (!) 55 (!) 56 (!) 55  Resp: 16 16 15 15   Temp:      TempSrc:      SpO2:      Weight:      Height:        Intake/Output Summary (Last 24 hours) at 09/26/2019 1122 Last data filed at 09/26/2019 0423 Gross per 24 hour  Intake 600 ml  Output 100 ml  Net 500 ml   Filed Weights   09/24/19 0450 09/25/19 2014 09/26/19 0715  Weight: 130.6 kg  130.6 kg 126.8 kg    Exam:  . General: 78 y.o. year-old female obese in no acute distress.  Alert and oriented x3.   Cardiovascular: Regular rate and  rhythm no rubs or gallops.   Respiratory: Clear to Auscultation No Wheezes or Rales.   Abdomen: Obese nontender normal bowel sounds present. Musculoskeletal: Trace lower extremity edema bilaterally.   Psychiatry: Mood is appropriate for condition and setting.  Data Reviewed: CBC: Recent Labs  Lab 09/20/19 0444 09/21/19 0135 09/26/19 0728  WBC 11.1* 10.4 7.1  HGB 9.7* 9.1* 8.5*  HCT 29.9* 29.3* 27.7*  MCV 89.8 93.3 94.2  PLT 257 249 326   Basic Metabolic Panel: Recent Labs  Lab 09/20/19 0444 09/21/19 0135 09/22/19 0431 09/23/19 0541 09/26/19 0728  NA 130* 127* 130* 129* 128*  K 4.8 5.2* 4.3 4.5 4.1  CL 94* 93* 92* 94* 90*  CO2 25 21* 23 23 27   GLUCOSE 311* 325* 245* 108* 212*  BUN 35* 50* 38* 52* 36*  CREATININE 3.66* 4.61* 3.53* 4.01* 4.38*  CALCIUM 8.3* 9.3 8.9 8.7* 8.6*  PHOS 3.9 4.9* 3.5 3.8 3.0   GFR: Estimated Creatinine Clearance: 15.1 mL/min (A) (by C-G formula based on SCr of 4.38 mg/dL (H)). Liver Function Tests: Recent Labs  Lab 09/20/19 0444 09/21/19 0135 09/22/19 0431 09/23/19 0541 09/26/19 0728  ALBUMIN 2.7* 2.8* 3.0* 2.7* 2.6*   No results for input(s): LIPASE, AMYLASE in the last 168 hours. No results for input(s): AMMONIA in the last 168 hours. Coagulation Profile: Recent Labs  Lab 09/22/19 0431 09/23/19 0541 09/24/19 0504 09/25/19 0620 09/26/19 0208  INR 1.9* 2.2* 2.5* 2.5* 2.3*   Cardiac Enzymes: No results for input(s): CKTOTAL, CKMB, CKMBINDEX, TROPONINI in the last 168 hours. BNP (last 3 results) No results for input(s): PROBNP in the last 8760 hours. HbA1C: No results for input(s): HGBA1C in the last 72 hours. CBG: Recent Labs  Lab 09/25/19 1137 09/25/19 1640 09/25/19 2013 09/25/19 2356 09/26/19 0422  GLUCAP 206* 241* 175* 211* 205*   Lipid Profile: No results for input(s): CHOL, HDL, LDLCALC, TRIG, CHOLHDL, LDLDIRECT in the last 72 hours. Thyroid Function Tests: No results for input(s): TSH, T4TOTAL, FREET4,  T3FREE, THYROIDAB in the last 72 hours. Anemia Panel: No results for input(s): VITAMINB12, FOLATE, FERRITIN, TIBC, IRON, RETICCTPCT in the last 72 hours. Urine analysis:    Component Value Date/Time   COLORURINE AMBER (A) 07/03/2019 0600   APPEARANCEUR CLOUDY (A) 07/03/2019 0600   APPEARANCEUR Cloudy (A) 01/16/2018 1349   LABSPEC 1.020 07/03/2019 0600   LABSPEC 1.008 09/08/2014 1719   PHURINE 5.0 07/03/2019 0600   GLUCOSEU NEGATIVE 07/03/2019 0600   GLUCOSEU Negative 09/08/2014 1719   HGBUR NEGATIVE 07/03/2019 0600   BILIRUBINUR NEGATIVE 07/03/2019 0600   BILIRUBINUR Negative 01/16/2018 1349   BILIRUBINUR Negative 09/08/2014 1719   KETONESUR NEGATIVE 07/03/2019 0600   PROTEINUR 100 (A) 07/03/2019 0600   NITRITE NEGATIVE 07/03/2019 0600   LEUKOCYTESUR LARGE (A) 07/03/2019 0600   LEUKOCYTESUR 3+ 09/08/2014 1719   Sepsis Labs: @LABRCNTIP (procalcitonin:4,lacticidven:4)  ) Recent Results (from the past 240 hour(s))  SARS CORONAVIRUS 2 (TAT 6-24 HRS) Nasopharyngeal Nasopharyngeal Swab     Status: None   Collection Time: 09/25/19  5:18 PM   Specimen: Nasopharyngeal Swab  Result Value Ref Range Status   SARS Coronavirus 2 NEGATIVE NEGATIVE Final    Comment: (NOTE) SARS-CoV-2 target nucleic acids are NOT DETECTED. The SARS-CoV-2 RNA is generally detectable in upper and lower respiratory specimens during the acute phase  of infection. Negative results do not preclude SARS-CoV-2 infection, do not rule out co-infections with other pathogens, and should not be used as the sole basis for treatment or other patient management decisions. Negative results must be combined with clinical observations, patient history, and epidemiological information. The expected result is Negative. Fact Sheet for Patients: SugarRoll.be Fact Sheet for Healthcare Providers: https://www.woods-mathews.com/ This test is not yet approved or cleared by the [place]  FDA and  has been authorized for detection and/or diagnosis of SARS-CoV-2 by FDA under an Emergency Use Authorization (EUA). This EUA will remain  in effect (meaning this test can be used) for the duration of the COVID-19 declaration under Section 56 4(b)(1) of the Act, 21 U.S.C. section 360bbb-3(b)(1), unless the authorization is terminated or revoked sooner. Performed at [facility], [place] [address], [place], [place] [address]       Studies: No results found.  Scheduled Meds: . atorvastatin  40 mg Oral q1800  . calcium acetate  1,334 mg Oral TID AC  . Chlorhexidine Gluconate Cloth  6 each Topical Q0600  . citalopram  10 mg Oral Daily  . docusate sodium  100 mg Oral BID  . feeding supplement (GLUCERNA SHAKE)  237 mL Oral TID BM  . heparin      . heparin      . insulin aspart  0-15 Units Subcutaneous TID AC & HS  . insulin aspart  10 Units Subcutaneous TID WC  . insulin glargine  22 Units Subcutaneous QHS  . lamoTRIgine  25 mg Oral Daily  . levETIRAcetam  250 mg Oral BID  . mouth rinse  15 mL Mouth Rinse BID  . metoprolol tartrate  75 mg Oral BID  . mirabegron ER  25 mg Oral Daily  . OLANZapine  5 mg Oral QHS  . pantoprazole  40 mg Oral Daily  . senna  1 tablet Oral BID  . sodium chloride flush  3 mL Intravenous Q12H  . torsemide  40 mg Oral Once per day on Sun Tue Thu Sat  . vitamin C  500 mg Oral Daily  . warfarin  7.5 mg Oral ONCE-1800  . Warfarin - Pharmacist Dosing Inpatient   Does not apply q1800  . zinc sulfate  220 mg Oral Daily    Continuous Infusions:    LOS: 15 days     Kayleen Memos, MD [facility] Pager [phone]  If 7PM-7AM, please contact night-coverage [email or web address] Password [email or web address] 09/26/2019, 11:22 AM

## 2019-09-26 NOTE — Progress Notes (Signed)
Pt off unit for hemodialysis. Missed attempt at acute PT session. Will f/u as able.   Lavone Nian, PT, DPT

## 2019-09-26 NOTE — Procedures (Signed)
Patient seen on Hemodialysis. BP (!) 145/52 (BP Location: Left Arm)   Pulse (!) 55   Temp 99.6 F (37.6 C) (Oral)   Resp 16   Ht 5\' 7"  (1.702 m)   Wt 126.8 kg   SpO2 99%   BMI 43.78 kg/m   QB 400, UF goal 2.5L Complains of pain in anal/rectal area.   Elmarie Shiley MD [facility]. Office # [phone] Pager # [phone] 9:32 AM

## 2019-09-26 NOTE — Progress Notes (Signed)
ANTICOAGULATION CONSULT NOTE - Follow Up Consult  Pharmacy Consult for warfarin Indication: atrial fibrillation  Allergies  Allergen Reactions  . Sulfa Antibiotics Hives  . Biaxin [Clarithromycin] Hives  . Buspar [Buspirone]   . Influenza A (H1n1) Monoval Vac Other (See Comments)    Pt states that she was told by her MD not to get the influenza vaccine.    . Morphine Other (See Comments)    Reaction:  Dizziness and confusion   . Pyridium [Phenazopyridine Hcl] Other (See Comments)    Reaction:  Unknown   . Tuberculin Tests   . Ceftriaxone Anxiety  . Latex Rash  . Prednisone Rash  . Tape Rash    Patient Measurements: Height: 5\' 7"  (170.2 cm) Weight: 287 lb 15.1 oz (130.6 kg) IBW/kg (Calculated) : 61.6 Heparin Dosing Weight: 91 kg  Vital Signs: Temp: 98.5 F (36.9 C) (12/09 0423) BP: 155/52 (12/09 0423) Pulse Rate: 60 (12/09 0423)  Labs: Recent Labs    09/24/19 0504 09/25/19 0620 09/26/19 0208 09/26/19 0728  HGB  --   --   --  8.5*  HCT  --   --   --  27.7*  PLT  --   --   --  199  LABPROT 27.3* 27.1* 25.3*  --   INR 2.5* 2.5* 2.3*  --   CREATININE  --   --   --  4.38*    Estimated Creatinine Clearance: 15.4 mL/min (A) (by C-G formula based on SCr of 4.38 mg/dL (H)).   Assessment: 21 yof transferred from [facility] with atrial fibrillation and NSTEMI. No anticoagulation PTA, CHADSVASC 5. Of note, she was COVID + at her ALF 11/4. Noted hx GIB in 07/2018.  Heparin d/c'd on 12/3 per [facility]. INR stable at 2.3 and is now therapeutic.  LFTs wnl. Hgb 8.5. No significant drug interactions noted.    Goal of Therapy:  INR 2-3 Monitor platelets by anticoagulation protocol: Yes   Plan:  Repeat Warfarin 7.5mg  PO x 1 tonight Monitor daily INR, CBC, s/sx bleeding  Johanthan Kneeland A. Levada Dy, PharmD, BCPS, FNKF Clinical Pharmacist [place] Please utilize Amion for appropriate phone number to reach the unit pharmacist ([place])    09/26/2019 8:24 AM

## 2019-09-26 NOTE — Progress Notes (Signed)
Patient ID: Carolyn Valdez, female   DOB: 1941-01-14, 78 y.o.   MRN: [id number] [facility] Progress Note   Assessment/ Plan:   1.  COVID-19 infection: Respiratory status back to baseline following remdesivir/corticosteroids.  Course complicated by non-STEMI that is ongoing medical management. 2. ESRD: Continue HD on her usual Monday/Wednesday/Friday schedule.  Lower EDW as able with HD/UF. 3. Anemia: Status post ESA, no overt blood loss.  Monitor with hemodialysis/UF. 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 5.  Atrial flutter/atrial fibrillation with RVR: With RVR noted overnight, continue beta-blocker.  On Coumadin. 6. Hypertension: Blood pressure under acceptable control, continue to monitor with hemodialysis/current medications. 7.  Disposition: Awaiting nursing facility placement around [place].   Subjective:   Reports continued spasms in her anal/rectal area.   Objective:   BP (!) 153/53 (BP Location: Left Arm)   Pulse (!) 55   Temp 99.6 F (37.6 C) (Oral)   Resp 16   Ht 5\' 7"  (1.702 m)   Wt 126.8 kg   SpO2 99%   BMI 43.78 kg/m   Physical Exam: Gen: Appears comfortable resting in bed CVS: Pulse regular rhythm, normal rate, S1 and S2 normal Resp: Anteriorly clear to auscultation.  No rales/rhonchi.  Right IJ TDC Abd: Soft, obese, nontender Ext: Trace lower extremity edema  Labs: BMET Recent Labs  Lab 09/20/19 0444 09/21/19 0135 09/22/19 0431 09/23/19 0541 09/26/19 0728  NA 130* 127* 130* 129* 128*  K 4.8 5.2* 4.3 4.5 4.1  CL 94* 93* 92* 94* 90*  CO2 25 21* 23 23 27   GLUCOSE 311* 325* 245* 108* 212*  BUN 35* 50* 38* 52* 36*  CREATININE 3.66* 4.61* 3.53* 4.01* 4.38*  CALCIUM 8.3* 9.3 8.9 8.7* 8.6*  PHOS 3.9 4.9* 3.5 3.8 3.0   CBC Recent Labs  Lab 09/20/19 0444 09/21/19 0135 09/26/19 0728  WBC 11.1* 10.4 7.1  HGB 9.7* 9.1* 8.5*  HCT 29.9* 29.3* 27.7*  MCV 89.8 93.3 94.2   PLT 257 249 199   Medications:    . atorvastatin  40 mg Oral q1800  . calcium acetate  1,334 mg Oral TID AC  . Chlorhexidine Gluconate Cloth  6 each Topical Q0600  . citalopram  10 mg Oral Daily  . docusate sodium  100 mg Oral BID  . feeding supplement (GLUCERNA SHAKE)  237 mL Oral TID BM  . heparin      . insulin aspart  0-15 Units Subcutaneous TID AC & HS  . insulin aspart  10 Units Subcutaneous TID WC  . insulin glargine  22 Units Subcutaneous QHS  . lamoTRIgine  25 mg Oral Daily  . levETIRAcetam  250 mg Oral BID  . mouth rinse  15 mL Mouth Rinse BID  . metoprolol tartrate  75 mg Oral BID  . mirabegron ER  25 mg Oral Daily  . OLANZapine  5 mg Oral QHS  . pantoprazole  40 mg Oral Daily  . senna  1 tablet Oral BID  . sodium chloride flush  3 mL Intravenous Q12H  . torsemide  40 mg Oral Once per day on Sun Tue Thu Sat  . vitamin C  500 mg Oral Daily  . warfarin  7.5 mg Oral ONCE-1800  . Warfarin - Pharmacist Dosing Inpatient   Does not apply q1800  . zinc sulfate  220 mg Oral Daily   Elmarie Shiley, MD 09/26/2019, 9:33 AM

## 2019-09-26 NOTE — Plan of Care (Signed)
  Problem: Nutrition: Goal: Adequate nutrition will be maintained Outcome: Progressing   

## 2019-09-26 NOTE — TOC Progression Note (Addendum)
Transition of Care [facility]) - Progression Note    Patient Details  Name: Carolyn Valdez MRN: [id number] Date of Birth: 08/27/1941  Transition of Care [facility]) CM/SW Contact  Sharlet Salina Mila Homer, LCSW Phone Number: 09/26/2019, 2:10 PM  Clinical Narrative:  Patient's information sent to facilities in [place] and [place] counties. [facility] and [facility] made bed offers. CSW also followed up with SNF's in [facility] that declined. Talked with Lonn Georgia at [facility] and they declined as they don't take patients that are COVID positive within the last 2 months. Talked with Riki Sheer, Health and Wellness Director at [facility] and they don't have a skilled nursing facility at their site. Talked with admissions director at [facility] and they can't accept any more dialysis patient at this time.   Updated patient regarding facility search and contacts made with SNF's in [facility] and the bed offers of [facility] in [place] and [facility] in [place].  [name] is agreeable to [facility] in [place], and commented that [facility] is too far. Patient advised that her dialysis center will be changed to a [facility] while she is in rehab. Patient asked that her sister, Mercie Eon - [phone] be contacted regarding and updated regarding her discharge plan. Ebony Hail, admissions director at [facility] contacted and informed. CSW advised that a COVID test is needed 48/72 hours prior to d/c from hospital.     Call made to PT/OT office to determine when patient will be seen again as last PT/OT notes were 12/4 - no answer; CSW will continue attempts to speak with someone.      Expected Discharge Plan: Skilled Nursing Facility Barriers to Discharge: Other (comment)(COVID positive)  Expected Discharge Plan and Services Expected Discharge Plan: [place] In-house Referral: Clinical Social Work    Post Acute Care Choice: Nursing Home Living arrangements for the past 2 months: [place]                                     Social Determinants of Health (SDOH) Interventions  No SDOH interventions needed at this time.  Readmission Risk Interventions Readmission Risk Prevention Plan 07/04/2019 03/19/2019  Transportation Screening Complete Complete  Medication Review Press photographer) Complete Complete  PCP or Specialist appointment within 3-5 days of discharge Complete Complete  HRI or Home Care Consult Complete Complete  SW Recovery Care/Counseling Consult Complete Not Complete  SW Consult Not Complete Comments - na  Palliative Care Screening Not Applicable Not [place] Patient Refused Not Applicable  Some recent data might be hidden

## 2019-09-26 NOTE — Progress Notes (Signed)
OT Cancellation Note  Patient Details Name: Carolyn Valdez MRN: [id number] DOB: [birth year]   Cancelled Treatment:    Reason Eval/Treat Not Completed: Patient at procedure or test/ unavailable(HD. Will follow.)  [name] 09/26/2019, 10:29 AM  [name], OTR/L Acute Rehabilitation Services Pager: [phone] Office: [phone]

## 2019-09-27 DIAGNOSIS — N390 Urinary tract infection, site not specified: Secondary | ICD-10-CM

## 2019-09-27 DIAGNOSIS — N186 End stage renal disease: Secondary | ICD-10-CM

## 2019-09-27 DIAGNOSIS — N189 Chronic kidney disease, unspecified: Secondary | ICD-10-CM

## 2019-09-27 DIAGNOSIS — E1122 Type 2 diabetes mellitus with diabetic chronic kidney disease: Secondary | ICD-10-CM

## 2019-09-27 DIAGNOSIS — D631 Anemia in chronic kidney disease: Secondary | ICD-10-CM

## 2019-09-27 DIAGNOSIS — E871 Hypo-osmolality and hyponatremia: Secondary | ICD-10-CM

## 2019-09-27 DIAGNOSIS — I5032 Chronic diastolic (congestive) heart failure: Secondary | ICD-10-CM

## 2019-09-27 DIAGNOSIS — Z794 Long term (current) use of insulin: Secondary | ICD-10-CM

## 2019-09-27 DIAGNOSIS — Z992 Dependence on renal dialysis: Secondary | ICD-10-CM

## 2019-09-27 LAB — GLUCOSE, CAPILLARY
Glucose-Capillary: 153 mg/dL — ABNORMAL HIGH (ref 70–99)
Glucose-Capillary: 173 mg/dL — ABNORMAL HIGH (ref 70–99)
Glucose-Capillary: 177 mg/dL — ABNORMAL HIGH (ref 70–99)
Glucose-Capillary: 194 mg/dL — ABNORMAL HIGH (ref 70–99)
Glucose-Capillary: 241 mg/dL — ABNORMAL HIGH (ref 70–99)
Glucose-Capillary: 257 mg/dL — ABNORMAL HIGH (ref 70–99)

## 2019-09-27 LAB — SARS CORONAVIRUS 2 (TAT 6-24 HRS): SARS Coronavirus 2: NEGATIVE

## 2019-09-27 LAB — PROTIME-INR
INR: 2.5 — ABNORMAL HIGH (ref 0.8–1.2)
Prothrombin Time: 27.3 seconds — ABNORMAL HIGH (ref 11.4–15.2)

## 2019-09-27 MED ORDER — WARFARIN SODIUM 7.5 MG PO TABS
7.5000 mg | ORAL_TABLET | Freq: Once | ORAL | Status: AC
  Administered 2019-09-27: 7.5 mg via ORAL
  Filled 2019-09-27: qty 1

## 2019-09-27 MED ORDER — INSULIN GLARGINE 100 UNIT/ML ~~LOC~~ SOLN
26.0000 [IU] | Freq: Every day | SUBCUTANEOUS | Status: DC
  Administered 2019-09-27 – 2019-09-28 (×2): 26 [IU] via SUBCUTANEOUS
  Filled 2019-09-27 (×3): qty 0.26

## 2019-09-27 MED ORDER — SODIUM CHLORIDE 0.9 % IV SOLN
1.0000 g | INTRAVENOUS | Status: AC
  Administered 2019-09-27 – 2019-09-30 (×4): 1 g via INTRAVENOUS
  Filled 2019-09-27 (×4): qty 1

## 2019-09-27 NOTE — Progress Notes (Signed)
ANTICOAGULATION CONSULT NOTE - Follow Up Consult  Pharmacy Consult for warfarin Indication: atrial fibrillation  Allergies  Allergen Reactions  . Sulfa Antibiotics Hives  . Biaxin [Clarithromycin] Hives  . Buspar [Buspirone]   . Influenza A (H1n1) Monoval Vac Other (See Comments)    Pt states that she was told by her MD not to get the influenza vaccine.    . Morphine Other (See Comments)    Reaction:  Dizziness and confusion   . Pyridium [Phenazopyridine Hcl] Other (See Comments)    Reaction:  Unknown   . Tuberculin Tests   . Ceftriaxone Anxiety  . Latex Rash  . Prednisone Rash  . Tape Rash    Patient Measurements: Height: 5\' 7"  (170.2 cm) Weight: 274 lb 0.5 oz (124.3 kg) IBW/kg (Calculated) : 61.6 Heparin Dosing Weight: 91 kg  Vital Signs: Temp: 98.2 F (36.8 C) (12/10 0430) BP: 148/49 (12/10 0430) Pulse Rate: 60 (12/10 0430)  Labs: Recent Labs    09/25/19 0620 09/26/19 0208 09/26/19 0728 09/27/19 0632  HGB  --   --  8.5*  --   HCT  --   --  27.7*  --   PLT  --   --  199  --   LABPROT 27.1* 25.3*  --  27.3*  INR 2.5* 2.3*  --  2.5*  CREATININE  --   --  4.38*  --     Estimated Creatinine Clearance: 15 mL/min (A) (by C-G formula based on SCr of 4.38 mg/dL (H)).   Assessment: 81 yof transferred from [facility] with atrial fibrillation and NSTEMI. No anticoagulation PTA, CHADSVASC 5. Of note, she was COVID + at her ALF 11/4. Noted hx GIB in 07/2018.  INR stable at 2.5 and is therapeutic.  LFTs wnl. Hgb 8.5. No significant drug interactions noted.   Education completed with patient on 12/9  Goal of Therapy:  INR 2-3 Monitor platelets by anticoagulation protocol: Yes   Plan:  Repeat Warfarin 7.5mg  PO x 1 tonight Monitor daily INR, CBC, s/sx bleeding  Kaylan Yates A. Levada Dy, PharmD, BCPS, FNKF Clinical Pharmacist [place] Please utilize Amion for appropriate phone number to reach the unit pharmacist ([place])    09/27/2019 8:20 AM

## 2019-09-27 NOTE — Plan of Care (Signed)
  Problem: Skin Integrity: Goal: Risk for impaired skin integrity will decrease Outcome: Progressing   

## 2019-09-27 NOTE — Progress Notes (Signed)
[facility]  PROGRESS NOTE  Carolyn Valdez:[id number] DOB: [birth year] DOA: 09/11/2019 PCP: System, Provider Not In Admit date - 09/11/2019   Admitting Physician Lavina Hamman, MD  Outpatient Primary MD for the patient is System, Provider Not In  LOS - 2 Brief Narrative   SAHARAH SHERROW is a 78 y.o. year old female with medical history significant for chronic HFpEF,n Afib/flutter, severe pulmonary hypertension, GI bleed (07/2018), hypertension, hyperlipidemia, diabetes mellitus, obesity, and ESRD on hemodialysis who presented to [facility] on 09/11/2019 with dyspnea, fatigue and palpitations not tolerating HD and was found to have atrial flutter with RVR, hypotension and  elevated troponin (2236) with global ST depressions initially admitted with working diagnosis of NSTEMI after evaluation by cardiology ( Dr. Fletcher Anon and Dr. Saunders Revel).  Patient was started on IV heparin and beta-blockade with initial plan for ischemic evaluation pending her Covid test; however her Covid test returned positive.  Quick downtrending of her initial troponin from 2030 36-2021 and 2 hours in setting respiratory stress related to COVID-19 infection seem more consistent with supply demand mismatch or subacute ACS per cardiology and they did not recommend urgent cardiac catheterization during this hospital stay.  Patient was transferred to [facility] on 11/24 given ESRD requiring hemodialysis and COVID-19 positive status.  Hospital course complicated by atrial flutter with RVR and elevated troponin presumed to be demand ischemia in the setting of concurrent COVID-19 infection, unable to get TEE/DCCV in setting of Covid positive infection per cardiology, acute on chronic CHF preserved EF exacerbation, Covid pneumonia requiring remdesivir therapy for 5 days and Decadron for 10 days  Subjective  Mrs.  Gadison today has no complaints. No headache, No chest pain, No abdominal pain - No Nausea, No new weakness tingling or numbness, No  Cough . A & P  UTI.  Dysuria is improving, UA positive for infection, remains afebrile no systemic signs or symptoms no history of MDR in the past.  Has had E faecalis UTI about a year ago.  -IV ceftriaxone, monitor urine culture  Atrial flutter, rate controlled.  -Continue metoprolol 75 mg twice daily, Coumadin  -Follow-up with Dr. Fletcher Anon in [place] in 4 weeks, to discuss cardioversion   Elevated troponin, felt to be demand ischemia in setting Covid positive, a flutter with RVR, and CHF exacerbation per cardiology.  Initially admitted with working dx of NSTEMI. TTE showed normal LV function  -Cardiology recommends further nonurgent ischemic evaluation as outpatient after full recovery from current hospitalization.  -Continue aspirin, statin, beta-blocker  Covid 19 pneumonia, resolved.  Previously diagnosed 11/4 at her ALF without need for hospitalization.  Completed remdesivir 5-day course and Decadron 10-day course.  Asymptomatic, no airborne/contact precautions given greater than 21 days since onset of positive test and improvement in symptoms per ID (12/4, Dr. Graylon Good) and CDC/cone guidelines  -negative Covid test on 12/8  -Repeating 12/10 for SNF  ESRD on HD, MWF  -Tolerating HD in hospital, avoid nephrotoxins, monitor out  -continue phoslo  Acute on chronic diastolic., stable. euvolemic on my exam. actually down to 274 lbs from 279  -Volume management with HD and torsemide on nondialysis days due to intradialytic weight gain  -Continue to closely monitor daily weights  Mild hyponatremia, slightly worsening.  129 on 12/9, previous nadir of 127.  Likely hypervolemic hyponatremia in setting of ESRD  -Repeat BMP in 24 hours to monitor sodium, hopeful improvement with volume removal after HD  Anemia of CKD, hemoglobin stable at baseline -Monitor, daily CBC  Hypertension, at goal Hypotension transiently on admission. At goal on Beta blocker alone  -continue metoprolol  - hold home  hydralazine and losartan  Type 2 diabetes. Fasting glucose in 200s. A1c 7.9  -Hold home glipizide  -Increase Lantus from 20 to 26 units (home regimen 45 U)  History of seizure disorder, stable.  -Continue Keppra  Physical deconditioning.  -PT recommends skilled nursing facility for short-term rehab  Mood disorder, stable.  -Continue Celexa, Zyprexa, Lamictal     Family Communication  : We will update family  Code Status : DNR  Disposition Plan  : Continue IV ceftriaxone monitor urine cultures, which were her working on skilled nursing facility pending repeat Covid test being negative. PT/OT to reassess today  Consults  : Cardiology, nephrology  Procedures  :  TTE (11/25)  DVT Prophylaxis  :  coumadin  Lab Results  Component Value Date   PLT 199 09/26/2019    Diet :  Diet Order            Diet renal/carb modified with fluid restriction Diet-HS Snack? Nothing; Fluid restriction: 1500 mL Fluid; Room service appropriate? Yes; Fluid consistency: Thin  Diet effective now               Inpatient Medications Scheduled Meds: . atorvastatin  40 mg Oral q1800  . calcium acetate  1,334 mg Oral TID AC  . Chlorhexidine Gluconate Cloth  6 each Topical Q0600  . citalopram  10 mg Oral Daily  . docusate sodium  100 mg Oral BID  . feeding supplement (GLUCERNA SHAKE)  237 mL Oral TID BM  . insulin aspart  0-15 Units Subcutaneous TID AC & HS  . insulin aspart  10 Units Subcutaneous TID WC  . insulin glargine  26 Units Subcutaneous QHS  . lamoTRIgine  25 mg Oral Daily  . levETIRAcetam  250 mg Oral BID  . mouth rinse  15 mL Mouth Rinse BID  . metoprolol tartrate  75 mg Oral BID  . mirabegron ER  25 mg Oral Daily  . OLANZapine  5 mg Oral QHS  . pantoprazole  40 mg Oral Daily  . senna  1 tablet Oral BID  . sodium chloride flush  3 mL Intravenous Q12H  . torsemide  40 mg Oral Once per day on Sun Tue Thu Sat  . vitamin C  500 mg Oral Daily  . warfarin  7.5 mg Oral ONCE-1800   . Warfarin - Pharmacist Dosing Inpatient   Does not apply q1800  . zinc sulfate  220 mg Oral Daily   Continuous Infusions: . cefTRIAXone (ROCEPHIN)  IV 1 g (09/27/19 1027)   PRN Meds:.acetaminophen, albuterol, bisacodyl, chlorpheniramine-HYDROcodone, clonazePAM, guaiFENesin-dextromethorphan, heparin, magic mouthwash w/lidocaine, ondansetron **OR** ondansetron (ZOFRAN) IV, oxyCODONE, phenylephrine-shark liver oil-mineral oil-petrolatum, polyethylene glycol  Antibiotics  :   Anti-infectives (From admission, onward)   Start     Dose/Rate Route Frequency Ordered Stop   09/27/19 0900  cefTRIAXone (ROCEPHIN) 1 g in sodium chloride 0.9 % 100 mL IVPB     1 g 200 mL/hr over 30 Minutes Intravenous Every 24 hours 09/27/19 0806     09/16/19 1115  cefTRIAXone (ROCEPHIN) 2 g in sodium chloride 0.9 % 100 mL IVPB  Status:  Discontinued     2 g 200 mL/hr over 30 Minutes Intravenous Every 24 hours 09/16/19 1110 09/20/19 1021   09/14/19 1000  remdesivir 100 mg in sodium chloride 0.9 % 250 mL IVPB     100 mg 500 mL/hr  over 30 Minutes Intravenous Every 24 hours 09/13/19 0858 09/17/19 0932   09/13/19 1000  remdesivir 200 mg in sodium chloride 0.9 % 250 mL IVPB     200 mg 500 mL/hr over 30 Minutes Intravenous Once 09/13/19 0858 09/13/19 1126   09/12/19 2217  remdesivir 100 mg in sodium chloride 0.9 % 250 mL IVPB  Status:  Discontinued     100 mg 500 mL/hr over 30 Minutes Intravenous Every 24 hours 09/11/19 2224 09/11/19 2252   09/11/19 2230  remdesivir 200 mg in sodium chloride 0.9 % 250 mL IVPB  Status:  Discontinued     200 mg 500 mL/hr over 30 Minutes Intravenous Once 09/11/19 2224 09/11/19 2252       Objective   Vitals:   09/26/19 1617 09/26/19 2009 09/27/19 0430 09/27/19 0837  BP: (!) 126/41 (!) 137/45 (!) 148/49 (!) 137/37  Pulse: (!) 58 62 60 (!) 51  Resp: 18 16 20 18   Temp: 97.6 F (36.4 C) 99.1 F (37.3 C) 98.2 F (36.8 C) 97.7 F (36.5 C)  TempSrc: Oral Oral  Oral  SpO2: 99% 98% 99%  100%  Weight:      Height:        SpO2: 100 % O2 Flow Rate (L/min): 2 L/min  Wt Readings from Last 3 Encounters:  09/26/19 124.3 kg  09/10/19 117.9 kg  08/16/19 112.3 kg     Intake/Output Summary (Last 24 hours) at 09/27/2019 1326 Last data filed at 09/27/2019 0900 Gross per 24 hour  Intake 400 ml  Output 275 ml  Net 125 ml    Physical Exam:  Obese elderly female, in no acute distress Awake Alert, Oriented X 4, Normal affect No new F.N deficits,  [place].AT, No JVD appreciated Symmetrical Chest wall movement, Good air movement bilaterally, CTAB RRR,No Gallops,Rubs or new Murmurs,  +ve B.Sounds, Abd Soft, No tenderness, No organomegaly appreciated, No rebound, guarding or rigidity. No Cyanosis, Clubbing or edema, No new Rash or bruise    I have personally reviewed the following:   Data Reviewed:  CBC Recent Labs  Lab 09/21/19 0135 09/26/19 0728  WBC 10.4 7.1  HGB 9.1* 8.5*  HCT 29.3* 27.7*  PLT 249 199  MCV 93.3 94.2  MCH 29.0 28.9  MCHC 31.1 30.7  RDW 15.9* 15.6*    Chemistries  Recent Labs  Lab 09/21/19 0135 09/22/19 0431 09/23/19 0541 09/26/19 0728  NA 127* 130* 129* 128*  K 5.2* 4.3 4.5 4.1  CL 93* 92* 94* 90*  CO2 21* 23 23 27   GLUCOSE 325* 245* 108* 212*  BUN 50* 38* 52* 36*  CREATININE 4.61* 3.53* 4.01* 4.38*  CALCIUM 9.3 8.9 8.7* 8.6*   ------------------------------------------------------------------------------------------------------------------ No results for input(s): CHOL, HDL, LDLCALC, TRIG, CHOLHDL, LDLDIRECT in the last 72 hours.  Lab Results  Component Value Date   HGBA1C 7.9 (H) 09/10/2019   ------------------------------------------------------------------------------------------------------------------ No results for input(s): TSH, T4TOTAL, T3FREE, THYROIDAB in the last 72 hours.  Invalid input(s): FREET3 ------------------------------------------------------------------------------------------------------------------  No results for input(s): VITAMINB12, FOLATE, FERRITIN, TIBC, IRON, RETICCTPCT in the last 72 hours.  Coagulation profile Recent Labs  Lab 09/23/19 0541 09/24/19 0504 09/25/19 0620 09/26/19 0208 09/27/19 0632  INR 2.2* 2.5* 2.5* 2.3* 2.5*    No results for input(s): DDIMER in the last 72 hours.  Cardiac Enzymes No results for input(s): CKMB, TROPONINI, MYOGLOBIN in the last 168 hours.  Invalid input(s): CK ------------------------------------------------------------------------------------------------------------------    Component Value Date/Time   BNP 869.0 (H) 09/10/2019 1329  Micro Results Recent Results (from the past 240 hour(s))  SARS CORONAVIRUS 2 (TAT 6-24 HRS) Nasopharyngeal Nasopharyngeal Swab     Status: None   Collection Time: 09/25/19  5:18 PM   Specimen: Nasopharyngeal Swab  Result Value Ref Range Status   SARS Coronavirus 2 NEGATIVE NEGATIVE Final    Comment: (NOTE) SARS-CoV-2 target nucleic acids are NOT DETECTED. The SARS-CoV-2 RNA is generally detectable in upper and lower respiratory specimens during the acute phase of infection. Negative results do not preclude SARS-CoV-2 infection, do not rule out co-infections with other pathogens, and should not be used as the sole basis for treatment or other patient management decisions. Negative results must be combined with clinical observations, patient history, and epidemiological information. The expected result is Negative. Fact Sheet for Patients: SugarRoll.be Fact Sheet for Healthcare Providers: https://www.woods-mathews.com/ This test is not yet approved or cleared by the [place] FDA and  has been authorized for detection and/or diagnosis of SARS-CoV-2 by FDA under an Emergency Use Authorization (EUA). This EUA will remain  in effect (meaning this test can be used) for the duration of the COVID-19 declaration under Section 56 4(b)(1) of the Act, 21  U.S.C. section 360bbb-3(b)(1), unless the authorization is terminated or revoked sooner. Performed at [facility], [place] [address] [place], [place] [address]   Culture, Urine     Status: Abnormal (Preliminary result)   Collection Time: 09/26/19  2:51 PM   Specimen: Urine, Clean Catch  Result Value Ref Range Status   Specimen Description URINE, CLEAN CATCH  Final   Special Requests   Final    Normal Performed at [facility], [place] [address], [place], [place] [address]    Culture (A)  Final    60,000 COLONIES/mL GRAM NEGATIVE RODS 60,000 COLONIES/mL YEAST    Report Status PENDING  Incomplete    Radiology Reports Portable chest 1 View  Result Date: 09/12/2019 CLINICAL DATA:  Shortness of breath. EXAM: PORTABLE CHEST 1 VIEW COMPARISON:  09/10/2019. FINDINGS: Right-sided dialysis catheter in stable position. Stable cardiomegaly and mild pulmonary vascular congestion noted. Mild bibasilar atelectasis with improved aeration from prior exam. Small left pleural effusion cannot be excluded. Biapical pleural thickening again noted most consistent scarring. No pneumothorax. IMPRESSION: 1.  Right-sided dialysis catheter stable position. 2. Stable cardiomegaly and mild pulmonary vascular congestion noted. 3. Mild bibasilar atelectasis with improved aeration from prior exam. Small left pleural effusion cannot be excluded. Electronically Signed   By: Marcello Moores  Register   On: 09/12/2019 07:51   DG Chest Port 1 View  Result Date: 09/10/2019 CLINICAL DATA:  78 year old female with shortness of breath. EXAM: PORTABLE CHEST 1 VIEW COMPARISON:  Chest radiograph dated 07/02/2019. FINDINGS: Right-sided dialysis catheter in similar position. There is stable cardiomegaly with mild vascular congestion. No edema. No focal consolidation or pneumothorax. Right apical subpleural crescentic density may represent scarring or trace loculated effusion. There is atherosclerotic calcification of the aortic  arch. No acute osseous pathology. IMPRESSION: 1. Stable cardiomegaly with mild vascular congestion. No focal consolidation or pneumothorax. 2. Right apical subpleural crescentic density may represent trace pleural effusion. Electronically Signed   By: Anner Crete M.D.   On: 09/10/2019 13:48   DG Abd Portable 1V  Result Date: 09/11/2019 CLINICAL DATA:  Abdominal pain and a COVID-19 positive patient. EXAM: PORTABLE ABDOMEN - 1 VIEW COMPARISON:  None. FINDINGS: The bowel gas pattern is normal. No radio-opaque calculi or other significant radiographic abnormality are seen. IMPRESSION: Negative exam. Electronically Signed  By: Inge Rise M.D.   On: 09/11/2019 09:53   ECHOCARDIOGRAM LIMITED  Result Date: 09/12/2019   ECHOCARDIOGRAM LIMITED REPORT   Patient Name:   MARWA FUHRMAN Ozier Date of Exam: 09/12/2019 Medical Rec #:  [id number]   Height:       67.0 in Accession #:    [id number]  Weight:       274.3 lb Date of Birth:  Jan 26, 1941   BSA:          2.31 m Patient Age:    8 years    BP:           117/71 mmHg Patient Gender: F           HR:           126 bpm. Exam Location:  Inpatient  Procedure: Limited Echo, Limited Color Doppler and Cardiac Doppler Indications:    ACS (acute coronary syndrome) ([place]) [[id number]]  History:        Patient has prior history of Echocardiogram examinations, most                 recent 03/02/2019. COVID-19. Chronic kidney disease. Past history                 of HFpEF, breast cancer.  Sonographer:    Darlina Sicilian RDCS Referring Phys: [phone] ADAM ROSS [place]  Sonographer Comments: Technically difficult study due to poor echo windows. IMPRESSIONS  1. Left ventricular ejection fraction, by visual estimation, is 60 to 65%. The left ventricle has normal function.  2. Left ventricular diastolic function could not be evaluated.  3. Global right ventricle has mildly reduced systolic function.The right ventricular size is not well visualized. Mildly increased right ventricular wall  thickness.  4. Moderate mitral annular calcification.  5. The mitral valve is abnormal. No evidence of mitral valve regurgitation. No evidence of mitral stenosis.  6. The aortic valve is grossly normal. Aortic valve regurgitation is not visualized. Mild aortic valve sclerosis without stenosis.  7. The tricuspid valve is not well visualized. Tricuspid valve regurgitation is trivial.  8. The pulmonic valve was grossly normal. Pulmonic valve regurgitation is trivial.  9. Trivial posterior pericardial effusion is present. FINDINGS  Left Ventricle: Left ventricular ejection fraction, by visual estimation, is 55 to 60%. The left ventricle has normal function. There is no left ventricular hypertrophy. Left ventricular diastolic function could not be evaluated. Right Ventricle: The right ventricular size is not well visualized. Mildly increased right ventricular wall thickness. Global RV systolic function is has mildly reduced systolic function. Left Atrium: Left atrial size was not assessed. Right Atrium: Right atrial size was not assessed Pericardium: Trivial pericardial effusion is present. Mitral Valve: The mitral valve is abnormal. Moderate mitral annular calcification. No evidence of mitral valve stenosis by observation. No evidence of mitral valve regurgitation. Tricuspid Valve: The tricuspid valve is not well visualized. Tricuspid valve regurgitation is trivial. Aortic Valve: The aortic valve is grossly normal. Aortic valve regurgitation is not visualized. Mild aortic valve sclerosis is present, with no evidence of aortic valve stenosis. Pulmonic Valve: The pulmonic valve was grossly normal. Pulmonic valve regurgitation is trivial. Aorta: Aortic root could not be assessed. Shunts: The interatrial septum was not assessed.  Cherlynn Kaiser MD Electronically signed by Cherlynn Kaiser MD Signature Date/Time: 09/12/2019/5:07:08 PM    Final      Time Spent in minutes  30     Desiree Hane M.D on 09/27/2019 at  1:26 PM  To  page go to [email or web address] - password [facility]

## 2019-09-27 NOTE — Progress Notes (Addendum)
Renal Navigator notified by CSW that patient needs OP HD treatment in [place] due to no SNF bed offers in [place]. Renal Navigator submitted OP HD referral for transient care (meaning less than 13 visits) to [facility] Admissions for treatment while she is in rehab. Renal Navigator spoke with [facility] to request that records to faxed to [facility] Admissions. Renal Navigator notes that patient had a negative COVID test on 09/25/19, but since she recently tested positive, Renal Navigator requested that Attending order another COVID test, as [facility]' policy is 2 negative tests in order to be treated in a negative shift. Renal Navigator will follow closely.  Alphonzo Cruise, [place] Renal Navigator [phone]

## 2019-09-27 NOTE — Progress Notes (Signed)
Patient ID: Carolyn Valdez, female   DOB: 04-27-1941, 78 y.o.   MRN: [id number]  [facility] Progress Note   Assessment/ Plan:   1.  COVID-19 infection: Respiratory status back to baseline following remdesivir/corticosteroids.  Course complicated by non-STEMI that is ongoing medical management. 2. ESRD: Continue HD on her usual Monday/Wednesday/Friday schedule.  With evidence of volume excess and difficulty restricting her interdialytic weight gain ongoing torsemide on nondialysis days. 3. Anemia: Status post ESA, no overt blood loss.  Monitor with hemodialysis/UF. 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 5.  Atrial flutter/atrial fibrillation with RVR: With RVR noted overnight, continue beta-blocker.  On Coumadin. 6. Hypertension: Blood pressure under acceptable control, continue to monitor with hemodialysis/current medications. 7.  Disposition: She is possibly going to a SNF in [place] and will require transient hemodialysis chair locally pending PT/OT input.   Subjective:   Discomfort overnight from rectal area, slept poorly.   Objective:   BP (!) 137/37 (BP Location: Left Arm)   Pulse (!) 51   Temp 97.7 F (36.5 C) (Oral)   Resp 18   Ht 5\' 7"  (1.702 m)   Wt 124.3 kg   SpO2 100%   BMI 42.92 kg/m   Physical Exam: Gen: Sleeping comfortably in bed, awakens to calling out her name CVS: Pulse regular rhythm, normal rate, S1 and S2 normal Resp: Anteriorly clear to auscultation.  No rales/rhonchi.  Right IJ TDC Abd: Soft, obese, nontender Ext: Trace lower extremity edema  Labs: BMET Recent Labs  Lab 09/21/19 0135 09/22/19 0431 09/23/19 0541 09/26/19 0728  NA 127* 130* 129* 128*  K 5.2* 4.3 4.5 4.1  CL 93* 92* 94* 90*  CO2 21* 23 23 27   GLUCOSE 325* 245* 108* 212*  BUN 50* 38* 52* 36*  CREATININE 4.61* 3.53* 4.01* 4.38*  CALCIUM 9.3 8.9 8.7* 8.6*  PHOS 4.9* 3.5 3.8 3.0   CBC Recent Labs   Lab 09/21/19 0135 09/26/19 0728  WBC 10.4 7.1  HGB 9.1* 8.5*  HCT 29.3* 27.7*  MCV 93.3 94.2  PLT 249 199   Medications:    . atorvastatin  40 mg Oral q1800  . calcium acetate  1,334 mg Oral TID AC  . Chlorhexidine Gluconate Cloth  6 each Topical Q0600  . citalopram  10 mg Oral Daily  . docusate sodium  100 mg Oral BID  . feeding supplement (GLUCERNA SHAKE)  237 mL Oral TID BM  . insulin aspart  0-15 Units Subcutaneous TID AC & HS  . insulin aspart  10 Units Subcutaneous TID WC  . insulin glargine  26 Units Subcutaneous QHS  . lamoTRIgine  25 mg Oral Daily  . levETIRAcetam  250 mg Oral BID  . mouth rinse  15 mL Mouth Rinse BID  . metoprolol tartrate  75 mg Oral BID  . mirabegron ER  25 mg Oral Daily  . OLANZapine  5 mg Oral QHS  . pantoprazole  40 mg Oral Daily  . senna  1 tablet Oral BID  . sodium chloride flush  3 mL Intravenous Q12H  . torsemide  40 mg Oral Once per day on Sun Tue Thu Sat  . vitamin C  500 mg Oral Daily  . warfarin  7.5 mg Oral ONCE-1800  . Warfarin - Pharmacist Dosing Inpatient   Does not apply q1800  . zinc sulfate  220 mg Oral Daily   Elmarie Shiley, MD 09/27/2019, 10:07 AM

## 2019-09-27 NOTE — Progress Notes (Signed)
Physical Therapy Treatment Patient Details Name: Carolyn Valdez MRN: [id number] DOB: 1941/05/05 Today's Date: 09/27/2019    History of Present Illness 78 y.o. female with medical history significant of ESRD on dialysis, chronic diastolic CHF, pulmonary hypertension, type 2 diabetes, GI bleed in Oct 2019, HTN, HLD, DJD, HFpEF, breast cancer, chronic lymphedema, obesity  anxiety/depression.  Chronically on 3L/min O2.  Presented to the ED 11/23 from  dialysis with dyspnea and dizziness. Pt tested COVID + at her ALF [name]).  CXR showed stable cardiomegaly with mild vascular congestion. ECG showed atrial flutter 2-1 AV condution, nonspecific ST and T wave changes.      PT Comments    Pt lethargic upon PT arrival to room, and spoke minimally with PT during session. Pt required total assist for repositioning up in bed with use of bed pads and boost function, pt stating "would you stop and would you leave me alone?" during PROM and repositioning in bed. PT continuing to recommend SNF level of care post-acutely.    Follow Up Recommendations  SNF     Equipment Recommendations  None recommended by PT    Recommendations for Other Services       Precautions / Restrictions Precautions Precautions: Fall Restrictions Weight Bearing Restrictions: No    Mobility  Bed Mobility Overal bed mobility: Needs Assistance             General bed mobility comments: pt adamantly refusing EOB or OOB activity. Total assist for sliding pt up in bed with use of bed pads and boost function.  Transfers                    Ambulation/Gait                 Stairs             Wheelchair Mobility    Modified Rankin (Stroke Patients Only)       Balance Overall balance assessment: Needs assistance   Sitting balance-Leahy Scale: Fair Sitting balance - Comments: not assessed this session, pt refused     Standing balance-Leahy Scale: Poor Standing balance comment: not  assessed this session, pt refused                            Cognition Arousal/Alertness: Lethargic;Suspect due to medications Behavior During Therapy: (irritable) Overall Cognitive Status: Impaired/Different from baseline Area of Impairment: Following commands;Problem solving                       Following Commands: Follows one step commands inconsistently     Problem Solving: Slow processing;Decreased initiation;Requires verbal cues;Requires tactile cues General Comments: Pt eye closing and would not arouse when PT spoke to her. PT performing LE PROM illicited pt irritability, stated "would you stop that?"      Exercises General Exercises - Lower Extremity Ankle Circles/Pumps: AROM;Right;5 reps;Seated Heel Slides: PROM;10 reps;Supine;Left Hip ABduction/ADduction: PROM;Left;5 reps;Supine    General Comments General comments (skin integrity, edema, etc.): 2LO2 via [place], VSS with resting HR at 55 bpm      Pertinent Vitals/Pain Pain Assessment: Faces Faces Pain Scale: Hurts whole lot Pain Location: "everywhere" Pain Descriptors / Indicators: Sore;Aching;Throbbing Pain Intervention(s): Limited activity within patient's tolerance;Monitored during session;Repositioned    Home Living                      Prior Function  PT Goals (current goals can now be found in the care plan section) Acute Rehab PT Goals Patient Stated Goal: go back to ALF PT Goal Formulation: With patient Time For Goal Achievement: 10/03/19 Potential to Achieve Goals: Fair Progress towards PT goals: Not progressing toward goals - comment(pt minimally interative with PT)    Frequency    Min 2X/week      PT Plan Current plan remains appropriate    Co-evaluation              AM-PAC PT "6 Clicks" Mobility   Outcome Measure  Help needed turning from your back to your side while in a flat bed without using bedrails?: Total Help needed moving from lying  on your back to sitting on the side of a flat bed without using bedrails?: Total Help needed moving to and from a bed to a chair (including a wheelchair)?: Total Help needed standing up from a chair using your arms (e.g., wheelchair or bedside chair)?: Total Help needed to walk in hospital room?: Total Help needed climbing 3-5 steps with a railing? : Total 6 Click Score: 6    End of Session Equipment Utilized During Treatment: Oxygen Activity Tolerance: Patient limited by pain;Patient limited by fatigue Patient left: in bed;with call bell/phone within reach;with bed alarm set Nurse Communication: Mobility status PT Visit Diagnosis: Unsteadiness on feet (R26.81);Other abnormalities of gait and mobility (R26.89);Muscle weakness (generalized) (M62.81);Difficulty in walking, not elsewhere classified (R26.2);Dizziness and giddiness (R42);Pain Pain - part of body: (rectum and low back)     Time: 4270-6237 PT Time Calculation (min) (ACUTE ONLY): 15 min  Charges:  $Therapeutic Activity: 8-22 mins                    [place] Foerster E, PT Acute Rehabilitation Services Pager [phone]  Office [phone]  Adon Gehlhausen D Millan Legan 09/27/2019, 10:36 AM

## 2019-09-27 NOTE — Progress Notes (Signed)
OT Cancellation Note  Patient Details Name: Carolyn Valdez MRN: [id number] DOB: October 10, 1941   Cancelled Treatment:     Patient sleeping upon arrival. Multiple attempts made to wake patient up, eventually patient states "leave me alone please" twice but refuses to open her eyes or elaborate on why. Will re-attempt as time permits.   Rosemary Holms 09/27/2019, 12:37 PM

## 2019-09-28 DIAGNOSIS — F4322 Adjustment disorder with anxiety: Secondary | ICD-10-CM

## 2019-09-28 LAB — RENAL FUNCTION PANEL
Albumin: 2.4 g/dL — ABNORMAL LOW (ref 3.5–5.0)
Anion gap: 9 (ref 5–15)
BUN: 23 mg/dL (ref 8–23)
CO2: 26 mmol/L (ref 22–32)
Calcium: 9 mg/dL (ref 8.9–10.3)
Chloride: 94 mmol/L — ABNORMAL LOW (ref 98–111)
Creatinine, Ser: 4.17 mg/dL — ABNORMAL HIGH (ref 0.44–1.00)
GFR calc Af Amer: 11 mL/min — ABNORMAL LOW (ref >60)
GFR calc non Af Amer: 10 mL/min — ABNORMAL LOW (ref >60)
Glucose, Bld: 218 mg/dL — ABNORMAL HIGH (ref 70–99)
Phosphorus: 3.8 mg/dL (ref 2.5–4.6)
Potassium: 3.8 mmol/L (ref 3.5–5.1)
Sodium: 129 mmol/L — ABNORMAL LOW (ref 135–145)

## 2019-09-28 LAB — CBC
HCT: 28.4 % — ABNORMAL LOW (ref 36.0–46.0)
Hemoglobin: 8.7 g/dL — ABNORMAL LOW (ref 12.0–15.0)
MCH: 28.8 pg (ref 26.0–34.0)
MCHC: 30.6 g/dL (ref 30.0–36.0)
MCV: 94 fL (ref 80.0–100.0)
Platelets: 225 10*3/uL (ref 150–400)
RBC: 3.02 MIL/uL — ABNORMAL LOW (ref 3.87–5.11)
RDW: 15.2 % (ref 11.5–15.5)
WBC: 5.9 10*3/uL (ref 4.0–10.5)
nRBC: 0 % (ref 0.0–0.2)

## 2019-09-28 LAB — PROTIME-INR
INR: 2.8 — ABNORMAL HIGH (ref 0.8–1.2)
Prothrombin Time: 29.1 seconds — ABNORMAL HIGH (ref 11.4–15.2)

## 2019-09-28 LAB — GLUCOSE, CAPILLARY
Glucose-Capillary: 165 mg/dL — ABNORMAL HIGH (ref 70–99)
Glucose-Capillary: 200 mg/dL — ABNORMAL HIGH (ref 70–99)
Glucose-Capillary: 209 mg/dL — ABNORMAL HIGH (ref 70–99)
Glucose-Capillary: 236 mg/dL — ABNORMAL HIGH (ref 70–99)
Glucose-Capillary: 238 mg/dL — ABNORMAL HIGH (ref 70–99)
Glucose-Capillary: 250 mg/dL — ABNORMAL HIGH (ref 70–99)

## 2019-09-28 MED ORDER — HEPARIN SODIUM (PORCINE) 1000 UNIT/ML IJ SOLN
INTRAMUSCULAR | Status: AC
  Administered 2019-09-28: 5000 [IU] via INTRAVENOUS_CENTRAL
  Filled 2019-09-28: qty 5

## 2019-09-28 MED ORDER — HEPARIN SODIUM (PORCINE) 1000 UNIT/ML DIALYSIS: 40.0000 [IU]/kg | INTRAMUSCULAR | Status: DC | PRN

## 2019-09-28 MED ORDER — HEPARIN SODIUM (PORCINE) 1000 UNIT/ML IJ SOLN
INTRAMUSCULAR | Status: AC
  Administered 2019-09-28: 3300 [IU] via INTRAVENOUS
  Filled 2019-09-28: qty 4

## 2019-09-28 MED ORDER — WARFARIN SODIUM 7.5 MG PO TABS
7.5000 mg | ORAL_TABLET | Freq: Once | ORAL | Status: AC
  Administered 2019-09-28: 7.5 mg via ORAL
  Filled 2019-09-28 (×2): qty 1

## 2019-09-28 NOTE — Progress Notes (Signed)
Patient ID: Carolyn Valdez, female   DOB: 05/24/1941, 78 y.o.   MRN: [id number] [facility] Progress Note   Assessment/ Plan:   1.  COVID-19 infection: Respiratory status back to baseline following remdesivir/corticosteroids.  Course complicated by non-STEMI that is ongoing medical management. 2. ESRD: Continue HD on her usual Monday/Wednesday/Friday schedule.  With evidence of volume excess and difficulty restricting her interdialytic weight gain ongoing torsemide on nondialysis days. Declined for transient admission to [facility]- back to searching for OP HD unit while in rehab. 3. Anemia: Status post ESA, no overt blood loss.  Monitor with hemodialysis/UF. 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 5.  Atrial flutter/atrial fibrillation with RVR: With RVR noted overnight, continue beta-blocker.  On Coumadin. 6. Hypertension: Blood pressure under acceptable control, continue to monitor with hemodialysis/current medications. 7.  Disposition: Attempting to find SNF/HD unit spots as OP.   Subjective:   Continues to have spasms/pain around rectal area.   Objective:   BP (!) (P) 104/28   Pulse (!) (P) 56   Temp 98.9 F (37.2 C) (Oral)   Resp (!) 23   Ht 5\' 7"  (1.702 m)   Wt 125.1 kg   SpO2 100%   BMI 43.20 kg/m   Physical Exam: Gen: Sleeping comfortably in dialysis- awakens to touch CVS: Pulse regular rhythm, normal rate, S1 and S2 normal Resp: Anteriorly clear to auscultation.  No rales/rhonchi.  Right IJ TDC Abd: Soft, obese, nontender Ext: Trace right lower extremity edema, 1+ left ankle edema  Labs: BMET Recent Labs  Lab 09/22/19 0431 09/23/19 0541 09/26/19 0728 09/28/19 0719  NA 130* 129* 128* 129*  K 4.3 4.5 4.1 3.8  CL 92* 94* 90* 94*  CO2 23 23 27 26   GLUCOSE 245* 108* 212* 218*  BUN 38* 52* 36* 23  CREATININE 3.53* 4.01* 4.38* 4.17*  CALCIUM 8.9 8.7* 8.6* 9.0  PHOS 3.5 3.8 3.0 3.8    CBC Recent Labs  Lab 09/26/19 0728 09/28/19 0719  WBC 7.1 5.9  HGB 8.5* 8.7*  HCT 27.7* 28.4*  MCV 94.2 94.0  PLT 199 225   Medications:    . atorvastatin  40 mg Oral q1800  . calcium acetate  1,334 mg Oral TID AC  . Chlorhexidine Gluconate Cloth  6 each Topical Q0600  . citalopram  10 mg Oral Daily  . docusate sodium  100 mg Oral BID  . feeding supplement (GLUCERNA SHAKE)  237 mL Oral TID BM  . insulin aspart  0-15 Units Subcutaneous TID AC & HS  . insulin aspart  10 Units Subcutaneous TID WC  . insulin glargine  26 Units Subcutaneous QHS  . lamoTRIgine  25 mg Oral Daily  . levETIRAcetam  250 mg Oral BID  . mouth rinse  15 mL Mouth Rinse BID  . metoprolol tartrate  75 mg Oral BID  . mirabegron ER  25 mg Oral Daily  . OLANZapine  5 mg Oral QHS  . pantoprazole  40 mg Oral Daily  . senna  1 tablet Oral BID  . sodium chloride flush  3 mL Intravenous Q12H  . torsemide  40 mg Oral Once per day on Sun Tue Thu Sat  . vitamin C  500 mg Oral Daily  . warfarin  7.5 mg Oral ONCE-1800  . Warfarin - Pharmacist Dosing Inpatient   Does not apply q1800  . zinc sulfate  220 mg Oral Daily   Elmarie Shiley, MD 09/28/2019,  10:01 AM

## 2019-09-28 NOTE — Progress Notes (Signed)
ANTICOAGULATION CONSULT NOTE - Follow Up Consult  Pharmacy Consult for warfarin Indication: atrial fibrillation  Allergies  Allergen Reactions  . Sulfa Antibiotics Hives  . Biaxin [Clarithromycin] Hives  . Buspar [Buspirone]   . Influenza A (H1n1) Monoval Vac Other (See Comments)    Pt states that she was told by her MD not to get the influenza vaccine.    . Morphine Other (See Comments)    Reaction:  Dizziness and confusion   . Pyridium [Phenazopyridine Hcl] Other (See Comments)    Reaction:  Unknown   . Tuberculin Tests   . Ceftriaxone Anxiety  . Latex Rash  . Prednisone Rash  . Tape Rash    Patient Measurements: Height: 5\' 7"  (170.2 cm) Weight: 277 lb 1.9 oz (125.7 kg) IBW/kg (Calculated) : 61.6 Heparin Dosing Weight: 91 kg  Vital Signs: Temp: 98.3 F (36.8 C) (12/11 0402) Temp Source: Oral (12/11 0402) BP: 121/38 (12/11 0402) Pulse Rate: 55 (12/11 0402)  Labs: Recent Labs    09/26/19 0208 09/26/19 0728 09/27/19 7628 09/28/19 0325 09/28/19 0719  HGB  --  8.5*  --   --  8.7*  HCT  --  27.7*  --   --  28.4*  PLT  --  199  --   --  225  LABPROT 25.3*  --  27.3* 29.1*  --   INR 2.3*  --  2.5* 2.8*  --   CREATININE  --  4.38*  --   --   --     Estimated Creatinine Clearance: 15 mL/min (A) (by C-G formula based on SCr of 4.38 mg/dL (H)).   Assessment: 9 yof transferred from [facility] with atrial fibrillation and NSTEMI. No anticoagulation PTA, CHADSVASC 5. Of note, she was COVID + at her ALF 11/4. Noted hx GIB in 07/2018.  INR stable at 2.8 and is therapeutic.  LFTs wnl. Hgb 8.7. No significant drug interactions noted.   Education completed with patient on 12/9  Goal of Therapy:  INR 2-3 Monitor platelets by anticoagulation protocol: Yes   Plan:  Repeat Warfarin 7.5mg  PO x 1 tonight Monitor daily INR, CBC, s/sx bleeding  [place] Belle[place] A. Levada Dy, PharmD, BCPS, FNKF Clinical Pharmacist [place] Please utilize Amion for appropriate phone number to reach the  unit pharmacist ([place])    09/28/2019 7:53 AM

## 2019-09-28 NOTE — TOC Progression Note (Signed)
Transition of Care (TOC) - Progression Note  *Insurance denied SNF placement - patient will return to [place].   Patient Details  Name: Carolyn Valdez MRN: [id number] Date of Birth: 1941/07/13  Transition of Care [facility]) CM/SW Contact  Sharlet Salina Mila Homer, LCSW Phone Number: 09/28/2019, 5:35 PM  Clinical Narrative:  Placement at [facility] confirmed today with Jackelyn Poling, admissions director, if [place] can be changed. Dialysis Coordinator Terri Piedra was able to get patient set-up at [place] 10 am chair time and arrive between 9:30-9:40 am.   CSW received a VM from [place] with [employer] (9:51 am) indicating that the MD wants to offer a peer-to-peer. Patient's attending can call [phone] and press option 5 to speak with the doctor. The deadline to call is 2 pm EST. Dr. Lonny Prude contacted (11:10 am) and provided with peer-to-peer information.   CSW and dialysis coordinator Terri Piedra visited with patient (11:55 am) and talked with her about going to [facility] and her dialysis being changed to [facility] in [place]. Contact made again with Vickii Chafe, admissions director at [facility] and updated her regarding patient's HD schedule with [facility] and the insurance peer-to-peer. The first peer-to-peer upheld the denial and MD advised. CSW was informed by insurance rep. Claiborne Billings that MD can still appeal and was provided with the phone number. Dr. Lonny Prude contacted and provided with this information.  Dr. Lonny Prude talked again with insurance medical director (after 2:50 pm). Received call from Dr. Lonny Prude at 3:14 pm and was informed that denial upheld as the medical director indicated that patient is at her baseline functionally and nothing medical really changed for her to be approved for rehab. Received a call from [place] with [employer] regarding final appeal denial and that family can appeal. They can call [phone] and have 30 days to appeal.   Attempted to reach  Riki Sheer, Health and Wellness Director at [facility] [phone] - her work cell) and 2 voice mails left. Visited with patient and update provided re: insurance denial. Patient was not upset and agreed that she is at her baseline with what she can do. Informed patient of attempts to reach medical director and that we will continue as she will discharge back to [facility]. Ms. Slaydon expressed understanding.     Expected Discharge Plan: Skilled Nursing Facility Barriers to Discharge: Other (comment)(COVID positive)  Expected Discharge Plan and Services Expected Discharge Plan: [employer] In-house Referral: Clinical Social Work   Post Acute Care Choice: Nursing Home Living arrangements for the past 2 months: [place]                                     Social Determinants of Health (SDOH) Interventions    Readmission Risk Interventions Readmission Risk Prevention Plan 07/04/2019 03/19/2019  Transportation Screening Complete Complete  Medication Review Press photographer) Complete Complete  PCP or Specialist appointment within 3-5 days of discharge Complete Complete  HRI or [place] Complete Complete  SW Recovery Care/Counseling Consult Complete Not Complete  SW Consult Not Complete Comments - na  Palliative Care Screening Not Applicable Not [place] Patient Refused Not Applicable  Some recent data might be hidden

## 2019-09-28 NOTE — Plan of Care (Signed)
  Problem: Health Behavior/Discharge Planning: Goal: Ability to manage health-related needs will improve Outcome: Progressing   

## 2019-09-28 NOTE — Progress Notes (Signed)
OT Cancellation Note  Patient Details Name: TUJUANA KILMARTIN MRN: [id number] DOB: [birth year]   Cancelled Treatment:    Reason Eval/Treat Not Completed: Patient at procedure or test/ unavailable(HD). Will follow.  [name] 09/28/2019, 8:20 AM  [name], OTR/L Acute Rehabilitation Services Pager: [phone] Office: [phone]

## 2019-09-28 NOTE — Progress Notes (Signed)
Occupational Therapy Treatment Patient Details Name: Carolyn Valdez MRN: [id number] DOB: November 05, 1940 Today's Date: 09/28/2019    History of present illness 78 y.o. female with medical history significant of ESRD on dialysis, chronic diastolic CHF, pulmonary hypertension, type 2 diabetes, GI bleed in Oct 2019, HTN, HLD, DJD, HFpEF, breast cancer, chronic lymphedema, obesity  anxiety/depression.  Chronically on 3L/min O2.  Presented to the ED 11/23 from  dialysis with dyspnea and dizziness. Pt tested COVID + at her ALF [facility]).  CXR showed stable cardiomegaly with mild vascular congestion. ECG showed atrial flutter 2-1 AV condution, nonspecific ST and T wave changes.     OT comments  Pt agreeable to sitting EOB and participating in grooming. Pt reports having AE for LB ADL when she was living in her condo, but they did not get moved to her ALF. Pt reports she is not able to have [place] in her ALF as outside people are not allowed in her facility. Continue to recommend SNF.  Follow Up Recommendations  SNF;Supervision/Assistance - 24 hour    Equipment Recommendations  None recommended by OT    Recommendations for Other Services      Precautions / Restrictions Precautions Precautions: Fall       Mobility Bed Mobility Overal bed mobility: Needs Assistance Bed Mobility: Supine to Sit;Sit to Supine     Supine to sit: Max assist;HOB elevated Sit to supine: Mod assist      Transfers                      Balance     Sitting balance-Leahy Scale: Fair                                     ADL either performed or assessed with clinical judgement   ADL Overall ADL's : Needs assistance/impaired     Grooming: Wash/dry hands;Wash/dry face;Oral care;Sitting;Supervision/safety;Set up         Lower Body Bathing Details (indicate cue type and reason): recommended long handled bath sponge       Lower Body Dressing Details (indicate cue type and reason): pt is  familiar with use of AE, her's was thrown away when she moved to [place] and Hygiene: Total assistance;Bed level               Vision       Perception     Praxis      Cognition Arousal/Alertness: Awake/alert Behavior During Therapy: WFL for tasks assessed/performed Overall Cognitive Status: Within Functional Limits for tasks assessed                                          Exercises     Shoulder Instructions       General Comments      Pertinent Vitals/ Pain       Pain Assessment: Faces Faces Pain Scale: Hurts even more Pain Location: "everywhere" Pain Descriptors / Indicators: Sore Pain Intervention(s): Monitored during session;Repositioned  Home Living                                          Prior Functioning/Environment  Frequency  Min 2X/week        Progress Toward Goals  OT Goals(current goals can now be found in the care plan section)  Progress towards OT goals: Progressing toward goals  Acute Rehab OT Goals Patient Stated Goal: go back to ALF OT Goal Formulation: With patient Time For Goal Achievement: 10/05/19  Plan Discharge plan remains appropriate    Co-evaluation    PT/OT/SLP Co-Evaluation/Treatment: Yes            AM-PAC OT "6 Clicks" Daily Activity     Outcome Measure   Help from another person eating meals?: None Help from another person taking care of personal grooming?: A Little Help from another person toileting, which includes using toliet, bedpan, or urinal?: Total Help from another person bathing (including washing, rinsing, drying)?: A Lot Help from another person to put on and taking off regular upper body clothing?: A Little Help from another person to put on and taking off regular lower body clothing?: Total 6 Click Score: 14    End of Session Equipment Utilized During Treatment: Oxygen  OT Visit Diagnosis: Unsteadiness  on feet (R26.81);Other abnormalities of gait and mobility (R26.89);Muscle weakness (generalized) (M62.81);Pain   Activity Tolerance Patient tolerated treatment well   Patient Left in bed;with call bell/phone within reach;with bed alarm set   Nurse Communication          Time: 0973-5329 OT Time Calculation (min): 21 min  Charges: OT General Charges $OT Visit: 1 Visit OT Treatments $Self Care/Home Management : 8-22 mins  [name], OTR/L Acute Rehabilitation Services Pager: [phone] Office: [phone]   [name] 09/28/2019, 4:18 PM

## 2019-09-28 NOTE — Progress Notes (Addendum)
[facility]  PROGRESS NOTE  Carolyn Valdez ZOX:[id number] DOB: 02/03/1941 DOA: 09/11/2019 PCP: System, Provider Not In Admit date - 09/11/2019   Admitting Physician Lavina Hamman, MD  Outpatient Primary MD for the patient is System, Provider Not In  LOS - 43 Brief Narrative   Carolyn Valdez is a 78 y.o. year old female with medical history significant for chronic HFpEF,n Afib/flutter, severe pulmonary hypertension, GI bleed (07/2018), hypertension, hyperlipidemia, diabetes mellitus, obesity, and ESRD on hemodialysis who presented to [facility] on 09/11/2019 with dyspnea, fatigue and palpitations not tolerating HD and was found to have atrial flutter with RVR, hypotension and  elevated troponin (2236) with global ST depressions initially admitted with working diagnosis of NSTEMI after evaluation by cardiology ( Dr. Fletcher Anon and Dr. Saunders Revel).  Patient was started on IV heparin and beta-blockade with initial plan for ischemic evaluation pending her Covid test; however her Covid test returned positive.  Quick downtrending of her initial troponin from 2030 36-2021 and 2 hours in setting respiratory stress related to COVID-19 infection seem more consistent with supply demand mismatch or subacute ACS per cardiology and they did not recommend urgent cardiac catheterization during this hospital stay.  Patient was transferred to [facility] on 11/24 given ESRD requiring hemodialysis and COVID-19 positive status.  Hospital course complicated by atrial flutter with RVR and elevated troponin presumed to be demand ischemia in the setting of concurrent COVID-19 infection, unable to get TEE/DCCV in setting of Covid positive infection per cardiology, acute on chronic CHF preserved EF exacerbation, Covid pneumonia requiring remdesivir therapy for 5 days and Decadron for 10 days  Subjective  Mrs.  Valdez today still notices some burning with urination and some mild abdominal pain in the area of her bladder without nausea.  A & P   UTI.  Dysuria is persistent, UA positive for infection, remains afebrile no systemic signs or symptoms no history of MDR in the past.  Has had E faecalis UTI about a year ago.  -IV ceftriaxone, monitor urine culture for sensitivities, pyridium for symptom control?  Atrial flutter, rate controlled.  -Continue metoprolol 75 mg twice daily, Coumadin  -Follow-up with Dr. Fletcher Anon in [place] in 4 weeks, to discuss cardioversion   Elevated troponin, felt to be demand ischemia in setting Covid positive, a flutter with RVR, and CHF exacerbation per cardiology.  Initially admitted with working dx of NSTEMI. TTE showed normal LV function  -Cardiology recommends further nonurgent ischemic evaluation as outpatient after full recovery from current hospitalization.  -Continue aspirin, statin, beta-blocker  Covid 19 pneumonia, resolved.  Previously diagnosed 11/4 at her ALF without need for hospitalization.  Completed remdesivir 5-day course and Decadron 10-day course.  Asymptomatic, no airborne/contact precautions given greater than 21 days since onset of positive test and improvement in symptoms per ID (12/4, Dr. Graylon Good) and CDC/cone guidelines  -negative Covid test on 12/8  -Repeating 12/10 for SNF  ESRD on HD, MWF  -Tolerating HD in hospital, avoid nephrotoxins, monitor out  -continue phoslo  Acute on chronic diastolic., stable. euvolemic on my exam. actually down to 274 lbs from 279  -Volume management with HD and torsemide on nondialysis days due to intradialytic weight gain  -Continue to closely monitor daily weights  Mild hyponatremia, stable. Ranging 129-130 over past 4 days.  Likely hypervolemic hyponatremia in setting of ESRD  -Daily BMP to monitor sodium, hopeful improvement with volume removal after HD  Anemia of CKD, hemoglobin stable at baseline -Monitor, daily CBC  Hypertension, at goal.  Hypotension transiently on admission.   -continue metoprolol  - hold home hydralazine and  losartan  Type 2 diabetes. Fasting glucose in 200s. A1c 7.9  -Hold home glipizide  -Lantus from  26 units (home regimen 45 U)  History of seizure disorder, stable.  -Continue Keppra  Physical deconditioning.  -PT recommends skilled nursing facility for short-term rehab  --Prior level of independence: needs assistance, baseline ambulatory from recliner to BR with RW, and WC for facility distances but unable to self propel.  Patient is max assist for ADLs.  Patient's current function is not different from prior and discussed case with peer-to-peer review patient does not meet criteria for SNF and I agree  -Education officer, museum working on potential ALF with home health therapy if possible  Mood disorder, stable.  -Continue Celexa, Zyprexa, Lamictal     Family Communication  : We will update family  Code Status : DNR  Disposition Plan  : Continue IV ceftriaxone monitor urine cultures, likely back to ALF with [facility] therapy Consults  : Cardiology, nephrology  Procedures  :  TTE (11/25)  DVT Prophylaxis  :  coumadin  Lab Results  Component Value Date   PLT 225 09/28/2019    Diet :  Diet Order            Diet renal/carb modified with fluid restriction Diet-HS Snack? Nothing; Fluid restriction: 1500 mL Fluid; Room service appropriate? Yes; Fluid consistency: Thin  Diet effective now               Inpatient Medications Scheduled Meds: . atorvastatin  40 mg Oral q1800  . calcium acetate  1,334 mg Oral TID AC  . Chlorhexidine Gluconate Cloth  6 each Topical Q0600  . citalopram  10 mg Oral Daily  . docusate sodium  100 mg Oral BID  . feeding supplement (GLUCERNA SHAKE)  237 mL Oral TID BM  . insulin aspart  0-15 Units Subcutaneous TID AC & HS  . insulin aspart  10 Units Subcutaneous TID WC  . insulin glargine  26 Units Subcutaneous QHS  . lamoTRIgine  25 mg Oral Daily  . levETIRAcetam  250 mg Oral BID  . mouth rinse  15 mL Mouth Rinse BID  . metoprolol tartrate  75 mg Oral BID   . mirabegron ER  25 mg Oral Daily  . OLANZapine  5 mg Oral QHS  . pantoprazole  40 mg Oral Daily  . senna  1 tablet Oral BID  . sodium chloride flush  3 mL Intravenous Q12H  . torsemide  40 mg Oral Once per day on Sun Tue Thu Sat  . vitamin C  500 mg Oral Daily  . warfarin  7.5 mg Oral ONCE-1800  . Warfarin - Pharmacist Dosing Inpatient   Does not apply q1800  . zinc sulfate  220 mg Oral Daily   Continuous Infusions: . cefTRIAXone (ROCEPHIN)  IV 1 g (09/28/19 1147)   PRN Meds:.acetaminophen, albuterol, bisacodyl, chlorpheniramine-HYDROcodone, clonazePAM, guaiFENesin-dextromethorphan, heparin, magic mouthwash w/lidocaine, ondansetron **OR** ondansetron (ZOFRAN) IV, oxyCODONE, phenylephrine-shark liver oil-mineral oil-petrolatum, polyethylene glycol  Antibiotics  :   Anti-infectives (From admission, onward)   Start     Dose/Rate Route Frequency Ordered Stop   09/27/19 0900  cefTRIAXone (ROCEPHIN) 1 g in sodium chloride 0.9 % 100 mL IVPB     1 g 200 mL/hr over 30 Minutes Intravenous Every 24 hours 09/27/19 0806     09/16/19 1115  cefTRIAXone (ROCEPHIN) 2 g in sodium chloride 0.9 % 100 mL  IVPB  Status:  Discontinued     2 g 200 mL/hr over 30 Minutes Intravenous Every 24 hours 09/16/19 1110 09/20/19 1021   09/14/19 1000  remdesivir 100 mg in sodium chloride 0.9 % 250 mL IVPB     100 mg 500 mL/hr over 30 Minutes Intravenous Every 24 hours 09/13/19 0858 09/17/19 0932   09/13/19 1000  remdesivir 200 mg in sodium chloride 0.9 % 250 mL IVPB     200 mg 500 mL/hr over 30 Minutes Intravenous Once 09/13/19 0858 09/13/19 1126   09/12/19 2217  remdesivir 100 mg in sodium chloride 0.9 % 250 mL IVPB  Status:  Discontinued     100 mg 500 mL/hr over 30 Minutes Intravenous Every 24 hours 09/11/19 2224 09/11/19 2252   09/11/19 2230  remdesivir 200 mg in sodium chloride 0.9 % 250 mL IVPB  Status:  Discontinued     200 mg 500 mL/hr over 30 Minutes Intravenous Once 09/11/19 2224 09/11/19 2252        Objective   Vitals:   09/28/19 1030 09/28/19 1100 09/28/19 1106 09/28/19 1140  BP: (!) 96/32 (!) 97/35 (!) 92/31 (!) 93/27  Pulse: (!) 53 (!) 56 (!) 53 (!) 58  Resp:   (!) 21 20  Temp:   98.3 F (36.8 C) 98.6 F (37 C)  TempSrc:   Oral Oral  SpO2:   100% 100%  Weight:      Height:        SpO2: 100 % O2 Flow Rate (L/min): 3 L/min  Wt Readings from Last 3 Encounters:  09/28/19 125.1 kg  09/10/19 117.9 kg  08/16/19 112.3 kg     Intake/Output Summary (Last 24 hours) at 09/28/2019 1408 Last data filed at 09/28/2019 1334 Gross per 24 hour  Intake 817 ml  Output 2112 ml  Net -1295 ml    Physical Exam:  Obese elderly female, in no acute distress Awake Alert, Oriented X 4, Normal affect No new F.N deficits,  [place].AT, No JVD appreciated Symmetrical Chest wall movement, Good air movement bilaterally, CTAB RRR,No Gallops,Rubs or new Murmurs,  +ve B.Sounds, Abd Soft, suprapubic tenderness, No organomegaly appreciated, No rebound, guarding or rigidity. No Cyanosis, Clubbing or edema, No new Rash or bruise    I have personally reviewed the following:   Data Reviewed:  CBC Recent Labs  Lab 09/26/19 0728 09/28/19 0719  WBC 7.1 5.9  HGB 8.5* 8.7*  HCT 27.7* 28.4*  PLT 199 225  MCV 94.2 94.0  MCH 28.9 28.8  MCHC 30.7 30.6  RDW 15.6* 15.2    Chemistries  Recent Labs  Lab 09/22/19 0431 09/23/19 0541 09/26/19 0728 09/28/19 0719  NA 130* 129* 128* 129*  K 4.3 4.5 4.1 3.8  CL 92* 94* 90* 94*  CO2 23 23 27 26   GLUCOSE 245* 108* 212* 218*  BUN 38* 52* 36* 23  CREATININE 3.53* 4.01* 4.38* 4.17*  CALCIUM 8.9 8.7* 8.6* 9.0   ------------------------------------------------------------------------------------------------------------------ No results for input(s): CHOL, HDL, LDLCALC, TRIG, CHOLHDL, LDLDIRECT in the last 72 hours.  Lab Results  Component Value Date   HGBA1C 7.9 (H) 09/10/2019    ------------------------------------------------------------------------------------------------------------------ No results for input(s): TSH, T4TOTAL, T3FREE, THYROIDAB in the last 72 hours.  Invalid input(s): FREET3 ------------------------------------------------------------------------------------------------------------------ No results for input(s): VITAMINB12, FOLATE, FERRITIN, TIBC, IRON, RETICCTPCT in the last 72 hours.  Coagulation profile Recent Labs  Lab 09/24/19 0504 09/25/19 0620 09/26/19 0208 09/27/19 0632 09/28/19 0325  INR 2.5* 2.5* 2.3* 2.5* 2.8*  No results for input(s): DDIMER in the last 72 hours.  Cardiac Enzymes No results for input(s): CKMB, TROPONINI, MYOGLOBIN in the last 168 hours.  Invalid input(s): CK ------------------------------------------------------------------------------------------------------------------    Component Value Date/Time   BNP 869.0 (H) 09/10/2019 1329    Micro Results Recent Results (from the past 240 hour(s))  SARS CORONAVIRUS 2 (TAT 6-24 HRS) Nasopharyngeal Nasopharyngeal Swab     Status: None   Collection Time: 09/25/19  5:18 PM   Specimen: Nasopharyngeal Swab  Result Value Ref Range Status   SARS Coronavirus 2 NEGATIVE NEGATIVE Final    Comment: (NOTE) SARS-CoV-2 target nucleic acids are NOT DETECTED. The SARS-CoV-2 RNA is generally detectable in upper and lower respiratory specimens during the acute phase of infection. Negative results do not preclude SARS-CoV-2 infection, do not rule out co-infections with other pathogens, and should not be used as the sole basis for treatment or other patient management decisions. Negative results must be combined with clinical observations, patient history, and epidemiological information. The expected result is Negative. Fact Sheet for Patients: SugarRoll.be Fact Sheet for Healthcare  Providers: https://www.woods-mathews.com/ This test is not yet approved or cleared by the [place] FDA and  has been authorized for detection and/or diagnosis of SARS-CoV-2 by FDA under an Emergency Use Authorization (EUA). This EUA will remain  in effect (meaning this test can be used) for the duration of the COVID-19 declaration under Section 56 4(b)(1) of the Act, 21 U.S.C. section 360bbb-3(b)(1), unless the authorization is terminated or revoked sooner. Performed at [facility], [place] [address] [place], [place] [address]   Culture, Urine     Status: Abnormal (Preliminary result)   Collection Time: 09/26/19  2:51 PM   Specimen: Urine, Clean Catch  Result Value Ref Range Status   Specimen Description URINE, CLEAN CATCH  Final   Special Requests Normal  Final   Culture (A)  Final    60,000 COLONIES/mL ESCHERICHIA COLI 60,000 COLONIES/mL YEAST REPEATING SUSCEPTIBILITIES Performed at [facility], [place] [address], [place], [place] [address]    Report Status PENDING  Incomplete  SARS CORONAVIRUS 2 (TAT 6-24 HRS) Nasopharyngeal Nasopharyngeal Swab     Status: None   Collection Time: 09/27/19  1:35 PM   Specimen: Nasopharyngeal Swab  Result Value Ref Range Status   SARS Coronavirus 2 NEGATIVE NEGATIVE Final    Comment: (NOTE) SARS-CoV-2 target nucleic acids are NOT DETECTED. The SARS-CoV-2 RNA is generally detectable in upper and lower respiratory specimens during the acute phase of infection. Negative results do not preclude SARS-CoV-2 infection, do not rule out co-infections with other pathogens, and should not be used as the sole basis for treatment or other patient management decisions. Negative results must be combined with clinical observations, patient history, and epidemiological information. The expected result is Negative. Fact Sheet for Patients: SugarRoll.be Fact Sheet for Healthcare  Providers: https://www.woods-mathews.com/ This test is not yet approved or cleared by the [place] FDA and  has been authorized for detection and/or diagnosis of SARS-CoV-2 by FDA under an Emergency Use Authorization (EUA). This EUA will remain  in effect (meaning this test can be used) for the duration of the COVID-19 declaration under Section 56 4(b)(1) of the Act, 21 U.S.C. section 360bbb-3(b)(1), unless the authorization is terminated or revoked sooner. Performed at [facility], [place] [address], [place], [place] [address]     Radiology Reports Portable chest 1 View  Result Date: 09/12/2019 CLINICAL DATA:  Shortness of breath. EXAM: PORTABLE  CHEST 1 VIEW COMPARISON:  09/10/2019. FINDINGS: Right-sided dialysis catheter in stable position. Stable cardiomegaly and mild pulmonary vascular congestion noted. Mild bibasilar atelectasis with improved aeration from prior exam. Small left pleural effusion cannot be excluded. Biapical pleural thickening again noted most consistent scarring. No pneumothorax. IMPRESSION: 1.  Right-sided dialysis catheter stable position. 2. Stable cardiomegaly and mild pulmonary vascular congestion noted. 3. Mild bibasilar atelectasis with improved aeration from prior exam. Small left pleural effusion cannot be excluded. Electronically Signed   By: Marcello Moores  Register   On: 09/12/2019 07:51   DG Chest Port 1 View  Result Date: 09/10/2019 CLINICAL DATA:  78 year old female with shortness of breath. EXAM: PORTABLE CHEST 1 VIEW COMPARISON:  Chest radiograph dated 07/02/2019. FINDINGS: Right-sided dialysis catheter in similar position. There is stable cardiomegaly with mild vascular congestion. No edema. No focal consolidation or pneumothorax. Right apical subpleural crescentic density may represent scarring or trace loculated effusion. There is atherosclerotic calcification of the aortic arch. No acute osseous pathology. IMPRESSION: 1. Stable  cardiomegaly with mild vascular congestion. No focal consolidation or pneumothorax. 2. Right apical subpleural crescentic density may represent trace pleural effusion. Electronically Signed   By: Anner Crete M.D.   On: 09/10/2019 13:48   DG Abd Portable 1V  Result Date: 09/11/2019 CLINICAL DATA:  Abdominal pain and a COVID-19 positive patient. EXAM: PORTABLE ABDOMEN - 1 VIEW COMPARISON:  None. FINDINGS: The bowel gas pattern is normal. No radio-opaque calculi or other significant radiographic abnormality are seen. IMPRESSION: Negative exam. Electronically Signed   By: Inge Rise M.D.   On: 09/11/2019 09:53   ECHOCARDIOGRAM LIMITED  Result Date: 09/12/2019   ECHOCARDIOGRAM LIMITED REPORT   Patient Name:   SHANETTA NICOLLS Zatarain Date of Exam: 09/12/2019 Medical Rec #:  [id number]   Height:       67.0 in Accession #:    [id number]  Weight:       274.3 lb Date of Birth:  [birth year]   BSA:          2.31 m Patient Age:    46 years    BP:           117/71 mmHg Patient Gender: F           HR:           126 bpm. Exam Location:  Inpatient  Procedure: Limited Echo, Limited Color Doppler and Cardiac Doppler Indications:    ACS (acute coronary syndrome) ([place]) [[id number]]  History:        Patient has prior history of Echocardiogram examinations, most                 recent 03/02/2019. COVID-19. Chronic kidney disease. Past history                 of HFpEF, breast cancer.  Sonographer:    Darlina Sicilian RDCS Referring Phys: [phone] ADAM ROSS [place]  Sonographer Comments: Technically difficult study due to poor echo windows. IMPRESSIONS  1. Left ventricular ejection fraction, by visual estimation, is 60 to 65%. The left ventricle has normal function.  2. Left ventricular diastolic function could not be evaluated.  3. Global right ventricle has mildly reduced systolic function.The right ventricular size is not well visualized. Mildly increased right ventricular wall thickness.  4. Moderate mitral annular calcification.  5. The  mitral valve is abnormal. No evidence of mitral valve regurgitation. No evidence of mitral stenosis.  6. The aortic valve is grossly normal. Aortic valve regurgitation is not  visualized. Mild aortic valve sclerosis without stenosis.  7. The tricuspid valve is not well visualized. Tricuspid valve regurgitation is trivial.  8. The pulmonic valve was grossly normal. Pulmonic valve regurgitation is trivial.  9. Trivial posterior pericardial effusion is present. FINDINGS  Left Ventricle: Left ventricular ejection fraction, by visual estimation, is 55 to 60%. The left ventricle has normal function. There is no left ventricular hypertrophy. Left ventricular diastolic function could not be evaluated. Right Ventricle: The right ventricular size is not well visualized. Mildly increased right ventricular wall thickness. Global RV systolic function is has mildly reduced systolic function. Left Atrium: Left atrial size was not assessed. Right Atrium: Right atrial size was not assessed Pericardium: Trivial pericardial effusion is present. Mitral Valve: The mitral valve is abnormal. Moderate mitral annular calcification. No evidence of mitral valve stenosis by observation. No evidence of mitral valve regurgitation. Tricuspid Valve: The tricuspid valve is not well visualized. Tricuspid valve regurgitation is trivial. Aortic Valve: The aortic valve is grossly normal. Aortic valve regurgitation is not visualized. Mild aortic valve sclerosis is present, with no evidence of aortic valve stenosis. Pulmonic Valve: The pulmonic valve was grossly normal. Pulmonic valve regurgitation is trivial. Aorta: Aortic root could not be assessed. Shunts: The interatrial septum was not assessed.  Cherlynn Kaiser MD Electronically signed by Cherlynn Kaiser MD Signature Date/Time: 09/12/2019/5:07:08 PM    Final      Time Spent in minutes  30     Desiree Hane M.D on 09/28/2019 at 2:08 PM  To page go to [email or web address] - password  [email or web address]

## 2019-09-28 NOTE — Plan of Care (Signed)
  Problem: Health Behavior/Discharge Planning: Goal: Ability to manage health-related needs will improve 09/28/2019 1221 by Baldo Ash, RN Outcome: Progressing 09/28/2019 1156 by Baldo Ash, RN Outcome: Progressing

## 2019-09-28 NOTE — Procedures (Signed)
Patient seen on Hemodialysis. BP (!) 93/30   Pulse (!) 50   Temp 98.9 F (37.2 C) (Oral)   Resp (!) 23   Ht 5\' 7"  (1.702 m)   Wt 125.1 kg   SpO2 100%   BMI 43.20 kg/m   QB 300, UF goal 1.5L Tolerating treatment without complaints at this time.   Elmarie Shiley MD [facility]. Office # [phone] Pager # [phone] 10:04 AM

## 2019-09-29 LAB — GLUCOSE, CAPILLARY
Glucose-Capillary: 257 mg/dL — ABNORMAL HIGH (ref 70–99)
Glucose-Capillary: 261 mg/dL — ABNORMAL HIGH (ref 70–99)
Glucose-Capillary: 230 mg/dL — ABNORMAL HIGH (ref 70–99)
Glucose-Capillary: 241 mg/dL — ABNORMAL HIGH (ref 70–99)
Glucose-Capillary: 199 mg/dL — ABNORMAL HIGH (ref 70–99)

## 2019-09-29 LAB — URINE CULTURE
Culture: 60000 — AB
Special Requests: NORMAL

## 2019-09-29 LAB — PROTIME-INR
INR: 2.9 — ABNORMAL HIGH (ref 0.8–1.2)
Prothrombin Time: 30.6 seconds — ABNORMAL HIGH (ref 11.4–15.2)

## 2019-09-29 MED ORDER — CEFDINIR 300 MG PO CAPS
300.0000 mg | ORAL_CAPSULE | ORAL | Status: DC
  Administered 2019-10-01: 300 mg via ORAL
  Filled 2019-09-29 (×2): qty 1

## 2019-09-29 MED ORDER — INSULIN GLARGINE 100 UNIT/ML ~~LOC~~ SOLN
30.0000 [IU] | Freq: Every day | SUBCUTANEOUS | Status: DC
  Administered 2019-09-29 – 2019-09-30 (×2): 30 [IU] via SUBCUTANEOUS
  Filled 2019-09-29 (×3): qty 0.3

## 2019-09-29 MED ORDER — WARFARIN SODIUM 6 MG PO TABS
6.0000 mg | ORAL_TABLET | Freq: Once | ORAL | Status: AC
  Administered 2019-09-29: 6 mg via ORAL
  Filled 2019-09-29: qty 1

## 2019-09-29 NOTE — Progress Notes (Signed)
Patient ID: Carolyn Valdez, female   DOB: 1941-02-16, 78 y.o.   MRN: [id number] [facility] Progress Note   Assessment/ Plan:   1.  COVID-19 infection: Respiratory status back to baseline following remdesivir/corticosteroids.  Course complicated by non-STEMI that is ongoing medical management. 2. ESRD: Continue HD on her usual Monday/Wednesday/Friday schedule.  With evidence of volume excess and difficulty restricting her interdialytic weight gain ongoing torsemide on nondialysis days.  TLC progression note reviewed-appears that she will be likely going back to [facility] living facility as insurance has denied SNF; this means that she will be returning back to her outpatient dialysis unit. 3. Anemia: Status post ESA, no overt blood loss.  Monitor with hemodialysis/UF. 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 5.  Atrial flutter/atrial fibrillation with RVR: With RVR noted overnight, continue beta-blocker.  On Coumadin. 6. Hypertension: Blood pressure under acceptable control, continue to monitor with hemodialysis/current medications. 7.  Disposition: Plans noted for discharge to [facility] after declined for SNF.  Subjective:   Case management note reviewed from yesterday, appears that she will be going back to [facility] based on insurance/coverage guidelines.   Objective:   BP (!) 144/43 (BP Location: Left Arm)   Pulse (!) 57   Temp 98.4 F (36.9 C) (Oral)   Resp 18   Ht 5\' 7"  (1.702 m)   Wt 125.1 kg   SpO2 99%   BMI 43.20 kg/m   Physical Exam: Gen: Resting comfortably in bed, awakens easily to calling out her name CVS: Pulse regular rhythm, normal rate, S1 and S2 normal Resp: Anteriorly clear to auscultation.  No rales/rhonchi.  Right IJ TDC Abd: Soft, obese, nontender Ext: Trace right lower extremity edema, 1+ left ankle edema  Labs: BMET Recent Labs  Lab 09/23/19 0541 09/26/19 0728 09/28/19 0719   NA 129* 128* 129*  K 4.5 4.1 3.8  CL 94* 90* 94*  CO2 23 27 26   GLUCOSE 108* 212* 218*  BUN 52* 36* 23  CREATININE 4.01* 4.38* 4.17*  CALCIUM 8.7* 8.6* 9.0  PHOS 3.8 3.0 3.8   CBC Recent Labs  Lab 09/26/19 0728 09/28/19 0719  WBC 7.1 5.9  HGB 8.5* 8.7*  HCT 27.7* 28.4*  MCV 94.2 94.0  PLT 199 225   Medications:    . atorvastatin  40 mg Oral q1800  . calcium acetate  1,334 mg Oral TID AC  . Chlorhexidine Gluconate Cloth  6 each Topical Q0600  . citalopram  10 mg Oral Daily  . docusate sodium  100 mg Oral BID  . feeding supplement (GLUCERNA SHAKE)  237 mL Oral TID BM  . insulin aspart  0-15 Units Subcutaneous TID AC & HS  . insulin aspart  10 Units Subcutaneous TID WC  . insulin glargine  26 Units Subcutaneous QHS  . lamoTRIgine  25 mg Oral Daily  . levETIRAcetam  250 mg Oral BID  . mouth rinse  15 mL Mouth Rinse BID  . metoprolol tartrate  75 mg Oral BID  . mirabegron ER  25 mg Oral Daily  . OLANZapine  5 mg Oral QHS  . pantoprazole  40 mg Oral Daily  . senna  1 tablet Oral BID  . sodium chloride flush  3 mL Intravenous Q12H  . torsemide  40 mg Oral Once per day on Sun Tue Thu Sat  . vitamin C  500 mg Oral Daily  . warfarin  6 mg Oral ONCE-1800  . Warfarin -  Pharmacist Dosing Inpatient   Does not apply q1800  . zinc sulfate  220 mg Oral Daily   Elmarie Shiley, MD 09/29/2019, 10:40 AM

## 2019-09-29 NOTE — Progress Notes (Signed)
[facility]  PROGRESS NOTE  Carolyn Valdez:[id number] DOB: 10-06-1941 DOA: 09/11/2019 PCP: System, Provider Not In Admit date - 09/11/2019   Admitting Physician Lavina Hamman, MD  Outpatient Primary MD for the patient is System, Provider Not In  LOS - 55 Brief Narrative   [name] is a 78 y.o. year old female with medical history significant for chronic HFpEF,n Afib/flutter, severe pulmonary hypertension, GI bleed (07/2018), hypertension, hyperlipidemia, diabetes mellitus, obesity, and ESRD on hemodialysis who presented to [facility] on 09/11/2019 with dyspnea, fatigue and palpitations not tolerating HD and was found to have atrial flutter with RVR, hypotension and  elevated troponin (2236) with global ST depressions initially admitted with working diagnosis of NSTEMI after evaluation by cardiology ( Dr. Fletcher Anon and Dr. Saunders Revel).  Patient was started on IV heparin and beta-blockade with initial plan for ischemic evaluation pending her Covid test; however her Covid test returned positive.  Quick downtrending of her initial troponin from 2030 36-2021 and 2 hours in setting respiratory stress related to COVID-19 infection seem more consistent with supply demand mismatch or subacute ACS per cardiology and they did not recommend urgent cardiac catheterization during this hospital stay.  Patient was transferred to [facility] on 11/24 given ESRD requiring hemodialysis and COVID-19 positive status.  Hospital course complicated by atrial flutter with RVR and elevated troponin presumed to be demand ischemia in the setting of concurrent COVID-19 infection, unable to get TEE/DCCV in setting of Covid positive infection per cardiology, acute on chronic CHF preserved EF exacerbation, Covid pneumonia requiring remdesivir therapy for 5 days and Decadron for 10 days  Subjective  Mrs.  Delfino today still notices some burning with urination and some mild abdominal pain in the area of her bladder without nausea.  A & P   E.coli UTI.  Dysuria is improved today, UA positive for infection, remains afebrile no systemic signs or symptoms no history of MDR in the past.  Has had E faecalis UTI about a year ago.  -IV ceftriaxone switch to cefdenir based on urine cx sensitivities.  Atrial flutter, rate controlled.  -Continue metoprolol 75 mg twice daily, Coumadin  -Follow-up with Dr. Fletcher Anon in [place] in 4 weeks, to discuss cardioversion   Elevated troponin, felt to be demand ischemia in setting Covid positive, a flutter with RVR, and CHF exacerbation per cardiology.  Initially admitted with working dx of NSTEMI. TTE showed normal LV function  -Cardiology recommends further nonurgent ischemic evaluation as outpatient after full recovery from current hospitalization.  -Continue aspirin, statin, beta-blocker  Covid 19 pneumonia, resolved.  Previously diagnosed 11/4 at her ALF without need for hospitalization.  Completed remdesivir 5-day course and Decadron 10-day course.  Asymptomatic, no airborne/contact precautions given greater than 21 days since onset of positive test and improvement in symptoms per ID (12/4, Dr. Graylon Good) and CDC/cone guidelines  -negative Covid test on 12/8  -Repeat 12/10 for SNF also negative  ESRD on HD, MWF  -Tolerating HD in hospital, avoid nephrotoxins, monitor out  -continue phoslo  Acute on chronic diastolic., stable. euvolemic on my exam.   -Volume management with HD and torsemide on nondialysis days due to intradialytic weight gain  -Continue to closely monitor daily weights  Mild hyponatremia, stable. Ranging 129-130 over past 4 days.  Likely hypervolemic hyponatremia in setting of ESRD  -Daily BMP to monitor sodium, hopeful improvement with volume removal after HD  Anemia of CKD, hemoglobin stable at baseline -Monitor, daily CBC  Hypertension, at goal. Hypotension transiently on  admission.   -continue metoprolol  - hold home hydralazine and losartan  Type 2 diabetes. Fasting  glucose in 200s. A1c 7.9  -Hold home glipizide  -will increase Lantus from  26 to 30  units (home regimen 45 U)  History of seizure disorder, stable.  -Continue Keppra  Physical deconditioning.  -PT recommends skilled nursing facility for short-term rehab  --However patient is currently at her prior level of independence; Pt agrees,   --SNF denied,   -Education officer, museum working on returning to her ALF with home health therapy if possible  Mood disorder, stable.  -Continue Celexa, Zyprexa, Lamictal     Family Communication  : We will update family  Code Status : DNR  Disposition Plan  : transition to PO antibiotic, likely back to ALF with [facility] therapy Consults  : Cardiology, nephrology  Procedures  :  TTE (11/25)  DVT Prophylaxis  :  coumadin  Lab Results  Component Value Date   PLT 225 09/28/2019    Diet :  Diet Order            Diet renal/carb modified with fluid restriction Diet-HS Snack? Nothing; Fluid restriction: 1500 mL Fluid; Room service appropriate? Yes; Fluid consistency: Thin  Diet effective now               Inpatient Medications Scheduled Meds: . atorvastatin  40 mg Oral q1800  . calcium acetate  1,334 mg Oral TID AC  . Chlorhexidine Gluconate Cloth  6 each Topical Q0600  . citalopram  10 mg Oral Daily  . docusate sodium  100 mg Oral BID  . feeding supplement (GLUCERNA SHAKE)  237 mL Oral TID BM  . insulin aspart  0-15 Units Subcutaneous TID AC & HS  . insulin aspart  10 Units Subcutaneous TID WC  . insulin glargine  26 Units Subcutaneous QHS  . lamoTRIgine  25 mg Oral Daily  . levETIRAcetam  250 mg Oral BID  . mouth rinse  15 mL Mouth Rinse BID  . metoprolol tartrate  75 mg Oral BID  . mirabegron ER  25 mg Oral Daily  . OLANZapine  5 mg Oral QHS  . pantoprazole  40 mg Oral Daily  . senna  1 tablet Oral BID  . sodium chloride flush  3 mL Intravenous Q12H  . torsemide  40 mg Oral Once per day on Sun Tue Thu Sat  . vitamin C  500 mg Oral Daily  .  warfarin  6 mg Oral ONCE-1800  . Warfarin - Pharmacist Dosing Inpatient   Does not apply q1800  . zinc sulfate  220 mg Oral Daily   Continuous Infusions: . cefTRIAXone (ROCEPHIN)  IV 1 g (09/29/19 0944)   PRN Meds:.acetaminophen, albuterol, bisacodyl, chlorpheniramine-HYDROcodone, clonazePAM, guaiFENesin-dextromethorphan, heparin, magic mouthwash w/lidocaine, ondansetron **OR** ondansetron (ZOFRAN) IV, oxyCODONE, phenylephrine-shark liver oil-mineral oil-petrolatum, polyethylene glycol  Antibiotics  :   Anti-infectives (From admission, onward)   Start     Dose/Rate Route Frequency Ordered Stop   09/27/19 0900  cefTRIAXone (ROCEPHIN) 1 g in sodium chloride 0.9 % 100 mL IVPB     1 g 200 mL/hr over 30 Minutes Intravenous Every 24 hours 09/27/19 0806     09/16/19 1115  cefTRIAXone (ROCEPHIN) 2 g in sodium chloride 0.9 % 100 mL IVPB  Status:  Discontinued     2 g 200 mL/hr over 30 Minutes Intravenous Every 24 hours 09/16/19 1110 09/20/19 1021   09/14/19 1000  remdesivir 100 mg in sodium chloride 0.9 %  250 mL IVPB     100 mg 500 mL/hr over 30 Minutes Intravenous Every 24 hours 09/13/19 0858 09/17/19 0932   09/13/19 1000  remdesivir 200 mg in sodium chloride 0.9 % 250 mL IVPB     200 mg 500 mL/hr over 30 Minutes Intravenous Once 09/13/19 0858 09/13/19 1126   09/12/19 2217  remdesivir 100 mg in sodium chloride 0.9 % 250 mL IVPB  Status:  Discontinued     100 mg 500 mL/hr over 30 Minutes Intravenous Every 24 hours 09/11/19 2224 09/11/19 2252   09/11/19 2230  remdesivir 200 mg in sodium chloride 0.9 % 250 mL IVPB  Status:  Discontinued     200 mg 500 mL/hr over 30 Minutes Intravenous Once 09/11/19 2224 09/11/19 2252       Objective   Vitals:   09/28/19 1930 09/28/19 2201 09/29/19 0359 09/29/19 0847  BP: (!) 152/42 (!) 146/43 (!) 134/49 (!) 144/43  Pulse: 64 68 66 (!) 57  Resp: 18  18 18   Temp: 99.5 F (37.5 C)  98.3 F (36.8 C) 98.4 F (36.9 C)  TempSrc: Oral  Oral Oral  SpO2: 99%  97% 99% 99%  Weight:      Height:        SpO2: 99 % O2 Flow Rate (L/min): 2 L/min  Wt Readings from Last 3 Encounters:  09/28/19 125.1 kg  09/10/19 117.9 kg  08/16/19 112.3 kg     Intake/Output Summary (Last 24 hours) at 09/29/2019 1654 Last data filed at 09/29/2019 1300 Gross per 24 hour  Intake 1137 ml  Output 400 ml  Net 737 ml    Physical Exam:  Obese elderly female, in no acute distress Awake Alert, Oriented X 4, Normal affect No new F.N deficits,  Catherine.AT, No JVD appreciated Symmetrical Chest wall movement, Good air movement bilaterally, CTAB RRR,No Gallops,Rubs or new Murmurs,  +ve B.Sounds, Abd Soft, suprapubic tenderness, No organomegaly appreciated, No rebound, guarding or rigidity. No Cyanosis, Clubbing or edema, No new Rash or bruise    I have personally reviewed the following:   Data Reviewed:  CBC Recent Labs  Lab 09/26/19 0728 09/28/19 0719  WBC 7.1 5.9  HGB 8.5* 8.7*  HCT 27.7* 28.4*  PLT 199 225  MCV 94.2 94.0  MCH 28.9 28.8  MCHC 30.7 30.6  RDW 15.6* 15.2    Chemistries  Recent Labs  Lab 09/23/19 0541 09/26/19 0728 09/28/19 0719  NA 129* 128* 129*  K 4.5 4.1 3.8  CL 94* 90* 94*  CO2 23 27 26   GLUCOSE 108* 212* 218*  BUN 52* 36* 23  CREATININE 4.01* 4.38* 4.17*  CALCIUM 8.7* 8.6* 9.0   ------------------------------------------------------------------------------------------------------------------ No results for input(s): CHOL, HDL, LDLCALC, TRIG, CHOLHDL, LDLDIRECT in the last 72 hours.  Lab Results  Component Value Date   HGBA1C 7.9 (H) 09/10/2019   ------------------------------------------------------------------------------------------------------------------ No results for input(s): TSH, T4TOTAL, T3FREE, THYROIDAB in the last 72 hours.  Invalid input(s): FREET3 ------------------------------------------------------------------------------------------------------------------ No results for input(s): VITAMINB12,  FOLATE, FERRITIN, TIBC, IRON, RETICCTPCT in the last 72 hours.  Coagulation profile Recent Labs  Lab 09/25/19 0620 09/26/19 0208 09/27/19 0632 09/28/19 0325 09/29/19 0534  INR 2.5* 2.3* 2.5* 2.8* 2.9*    No results for input(s): DDIMER in the last 72 hours.  Cardiac Enzymes No results for input(s): CKMB, TROPONINI, MYOGLOBIN in the last 168 hours.  Invalid input(s): CK ------------------------------------------------------------------------------------------------------------------    Component Value Date/Time   BNP 869.0 (H) 09/10/2019 1329    Micro Results  Recent Results (from the past 240 hour(s))  SARS CORONAVIRUS 2 (TAT 6-24 HRS) Nasopharyngeal Nasopharyngeal Swab     Status: None   Collection Time: 09/25/19  5:18 PM   Specimen: Nasopharyngeal Swab  Result Value Ref Range Status   SARS Coronavirus 2 NEGATIVE NEGATIVE Final    Comment: (NOTE) SARS-CoV-2 target nucleic acids are NOT DETECTED. The SARS-CoV-2 RNA is generally detectable in upper and lower respiratory specimens during the acute phase of infection. Negative results do not preclude SARS-CoV-2 infection, do not rule out co-infections with other pathogens, and should not be used as the sole basis for treatment or other patient management decisions. Negative results must be combined with clinical observations, patient history, and epidemiological information. The expected result is Negative. Fact Sheet for Patients: SugarRoll.be Fact Sheet for Healthcare Providers: https://www.woods-mathews.com/ This test is not yet approved or cleared by the [place] FDA and  has been authorized for detection and/or diagnosis of SARS-CoV-2 by FDA under an Emergency Use Authorization (EUA). This EUA will remain  in effect (meaning this test can be used) for the duration of the COVID-19 declaration under Section 56 4(b)(1) of the Act, 21 U.S.C. section 360bbb-3(b)(1), unless  the authorization is terminated or revoked sooner. Performed at [facility], [place] [address], [place], [place] [address]   Culture, Urine     Status: Abnormal   Collection Time: 09/26/19  2:51 PM   Specimen: Urine, Clean Catch  Result Value Ref Range Status   Specimen Description URINE, CLEAN CATCH  Final   Special Requests   Final    Normal Performed at [facility], [place] [address] [place], [place] [address]    Culture (A)  Final    60,000 COLONIES/mL ESCHERICHIA COLI 60,000 COLONIES/mL YEAST    Report Status 09/29/2019 FINAL  Final   Organism ID, Bacteria ESCHERICHIA COLI (A)  Final      Susceptibility   Escherichia coli - MIC*    AMPICILLIN >=32 RESISTANT Resistant     CEFAZOLIN >=64 RESISTANT Resistant     CEFTRIAXONE <=1 SENSITIVE Sensitive     CIPROFLOXACIN >=4 RESISTANT Resistant     GENTAMICIN <=1 SENSITIVE Sensitive     IMIPENEM <=0.25 SENSITIVE Sensitive     NITROFURANTOIN <=16 SENSITIVE Sensitive     TRIMETH/SULFA <=20 SENSITIVE Sensitive     AMPICILLIN/SULBACTAM >=32 RESISTANT Resistant     PIP/TAZO >=128 RESISTANT Resistant     * 60,000 COLONIES/mL ESCHERICHIA COLI  SARS CORONAVIRUS 2 (TAT 6-24 HRS) Nasopharyngeal Nasopharyngeal Swab     Status: None   Collection Time: 09/27/19  1:35 PM   Specimen: Nasopharyngeal Swab  Result Value Ref Range Status   SARS Coronavirus 2 NEGATIVE NEGATIVE Final    Comment: (NOTE) SARS-CoV-2 target nucleic acids are NOT DETECTED. The SARS-CoV-2 RNA is generally detectable in upper and lower respiratory specimens during the acute phase of infection. Negative results do not preclude SARS-CoV-2 infection, do not rule out co-infections with other pathogens, and should not be used as the sole basis for treatment or other patient management decisions. Negative results must be combined with clinical observations, patient history, and epidemiological information. The expected result is Negative. Fact Sheet for Patients:  SugarRoll.be Fact Sheet for Healthcare Providers: https://www.woods-mathews.com/ This test is not yet approved or cleared by the [place] FDA and  has been authorized for detection and/or diagnosis of SARS-CoV-2 by FDA under an Emergency Use Authorization (EUA). This EUA will remain  in effect (meaning this test  can be used) for the duration of the COVID-19 declaration under Section 56 4(b)(1) of the Act, 21 U.S.C. section 360bbb-3(b)(1), unless the authorization is terminated or revoked sooner. Performed at [facility], [place] [address], [place], [place] [address]     Radiology Reports Portable chest 1 View  Result Date: 09/12/2019 CLINICAL DATA:  Shortness of breath. EXAM: PORTABLE CHEST 1 VIEW COMPARISON:  09/10/2019. FINDINGS: Right-sided dialysis catheter in stable position. Stable cardiomegaly and mild pulmonary vascular congestion noted. Mild bibasilar atelectasis with improved aeration from prior exam. Small left pleural effusion cannot be excluded. Biapical pleural thickening again noted most consistent scarring. No pneumothorax. IMPRESSION: 1.  Right-sided dialysis catheter stable position. 2. Stable cardiomegaly and mild pulmonary vascular congestion noted. 3. Mild bibasilar atelectasis with improved aeration from prior exam. Small left pleural effusion cannot be excluded. Electronically Signed   By: Marcello Moores  Register   On: 09/12/2019 07:51   DG Chest Port 1 View  Result Date: 09/10/2019 CLINICAL DATA:  78 year old female with shortness of breath. EXAM: PORTABLE CHEST 1 VIEW COMPARISON:  Chest radiograph dated 07/02/2019. FINDINGS: Right-sided dialysis catheter in similar position. There is stable cardiomegaly with mild vascular congestion. No edema. No focal consolidation or pneumothorax. Right apical subpleural crescentic density may represent scarring or trace loculated effusion. There is atherosclerotic calcification of the  aortic arch. No acute osseous pathology. IMPRESSION: 1. Stable cardiomegaly with mild vascular congestion. No focal consolidation or pneumothorax. 2. Right apical subpleural crescentic density may represent trace pleural effusion. Electronically Signed   By: Anner Crete M.D.   On: 09/10/2019 13:48   DG Abd Portable 1V  Result Date: 09/11/2019 CLINICAL DATA:  Abdominal pain and a COVID-19 positive patient. EXAM: PORTABLE ABDOMEN - 1 VIEW COMPARISON:  None. FINDINGS: The bowel gas pattern is normal. No radio-opaque calculi or other significant radiographic abnormality are seen. IMPRESSION: Negative exam. Electronically Signed   By: Inge Rise M.D.   On: 09/11/2019 09:53   ECHOCARDIOGRAM LIMITED  Result Date: 09/12/2019   ECHOCARDIOGRAM LIMITED REPORT   Patient Name:   Carolyn Valdez [place] Date of Exam: 09/12/2019 Medical Rec #:  [id number]   Height:       67.0 in Accession #:    [id number]  Weight:       274.3 lb Date of Birth:  11/12/1940   BSA:          2.31 m Patient Age:    17 years    BP:           117/71 mmHg Patient Gender: F           HR:           126 bpm. Exam Location:  Inpatient  Procedure: Limited Echo, Limited Color Doppler and Cardiac Doppler Indications:    ACS (acute coronary syndrome) ([place]) [[id number]]  History:        Patient has prior history of Echocardiogram examinations, most                 recent 03/02/2019. COVID-19. Chronic kidney disease. Past history                 of HFpEF, breast cancer.  Sonographer:    Darlina Sicilian RDCS Referring Phys: [phone] ADAM ROSS [place]  Sonographer Comments: Technically difficult study due to poor echo windows. IMPRESSIONS  1. Left ventricular ejection fraction, by visual estimation, is 60 to 65%. The left ventricle has normal function.  2. Left ventricular diastolic function could  not be evaluated.  3. Global right ventricle has mildly reduced systolic function.The right ventricular size is not well visualized. Mildly increased right ventricular  wall thickness.  4. Moderate mitral annular calcification.  5. The mitral valve is abnormal. No evidence of mitral valve regurgitation. No evidence of mitral stenosis.  6. The aortic valve is grossly normal. Aortic valve regurgitation is not visualized. Mild aortic valve sclerosis without stenosis.  7. The tricuspid valve is not well visualized. Tricuspid valve regurgitation is trivial.  8. The pulmonic valve was grossly normal. Pulmonic valve regurgitation is trivial.  9. Trivial posterior pericardial effusion is present. FINDINGS  Left Ventricle: Left ventricular ejection fraction, by visual estimation, is 55 to 60%. The left ventricle has normal function. There is no left ventricular hypertrophy. Left ventricular diastolic function could not be evaluated. Right Ventricle: The right ventricular size is not well visualized. Mildly increased right ventricular wall thickness. Global RV systolic function is has mildly reduced systolic function. Left Atrium: Left atrial size was not assessed. Right Atrium: Right atrial size was not assessed Pericardium: Trivial pericardial effusion is present. Mitral Valve: The mitral valve is abnormal. Moderate mitral annular calcification. No evidence of mitral valve stenosis by observation. No evidence of mitral valve regurgitation. Tricuspid Valve: The tricuspid valve is not well visualized. Tricuspid valve regurgitation is trivial. Aortic Valve: The aortic valve is grossly normal. Aortic valve regurgitation is not visualized. Mild aortic valve sclerosis is present, with no evidence of aortic valve stenosis. Pulmonic Valve: The pulmonic valve was grossly normal. Pulmonic valve regurgitation is trivial. Aorta: Aortic root could not be assessed. Shunts: The interatrial septum was not assessed.  Cherlynn Kaiser MD Electronically signed by Cherlynn Kaiser MD Signature Date/Time: 09/12/2019/5:07:08 PM    Final      Time Spent in minutes  30     Desiree Hane M.D on 09/29/2019  at 4:54 PM  To page go to [email or web address] - password [facility]

## 2019-09-29 NOTE — TOC Progression Note (Addendum)
Transition of Care [facility]) - Progression Note    Patient Details  Name: Carolyn Valdez MRN: [id number] Date of Birth: 12-25-1940  Transition of [facility]) CM/SW [place], [place] Phone Number: 09/29/2019, 12:25 PM  Clinical Narrative:     Update 12:36pm: CSW received a phone call back from [facility]. They stated they would not be able to admit the patient this weekend. She instructed the CSW to contact nursing staff on Monday.   CSW will attempt to call the facility on Monday and staff patient's case. CSW will continue to follow and assist with disposition planning.   Domenic Schwab, MSW, LCSW-A Clinical Social Worker [facility]  [phone]  _________________________________  CSW called [name] in [place] at [phone]. CSW spoke with the receptionist. Receptionist stated she would have one of the med techs contact the CSW back regarding the patient.   CSW is awaiting a return phone call. CSW will continue to follow and assist with disposition planning.   Expected Discharge Plan: Skilled Nursing Facility Barriers to Discharge: Other (comment)(COVID positive)  Expected Discharge Plan and Services Expected Discharge Plan: [place] In-house Referral: Clinical Social Work   Post Acute Care Choice: Nursing Home Living arrangements for the past 2 months: [place]                                       Social Determinants of Health (SDOH) Interventions    Readmission Risk Interventions Readmission Risk Prevention Plan 07/04/2019 03/19/2019  Transportation Screening Complete Complete  Medication Review Press photographer) Complete Complete  PCP or Specialist appointment within 3-5 days of discharge Complete Complete  HRI or [place] Complete Complete  SW Recovery Care/Counseling Consult Complete Not Complete  SW Consult Not Complete Comments - na  Palliative Care Screening Not Applicable Not [place] Patient Refused Not Applicable  Some recent data might be hidden

## 2019-09-29 NOTE — Progress Notes (Signed)
ANTICOAGULATION CONSULT NOTE - Follow Up Consult  Pharmacy Consult for warfarin Indication: atrial fibrillation  Allergies  Allergen Reactions  . Sulfa Antibiotics Hives  . Biaxin [Clarithromycin] Hives  . Buspar [Buspirone]   . Influenza A (H1n1) [place] Vac Other (See Comments)    Pt states that she was told by her MD not to get the influenza vaccine.    . Morphine Other (See Comments)    Reaction:  Dizziness and confusion   . Pyridium [Phenazopyridine Hcl] Other (See Comments)    Reaction:  Unknown   . Tuberculin Tests   . Ceftriaxone Anxiety  . Latex Rash  . Prednisone Rash  . Tape Rash    Patient Measurements: Height: 5\' 7"  (170.2 cm) Weight: 275 lb 12.7 oz (125.1 kg) IBW/kg (Calculated) : 61.6 Heparin Dosing Weight: 91 kg  Vital Signs: Temp: 98.4 F (36.9 C) (12/12 0847) Temp Source: Oral (12/12 0847) BP: 144/43 (12/12 0847) Pulse Rate: 57 (12/12 0847)  Labs: Recent Labs    09/27/19 0632 09/28/19 0325 09/28/19 0719 09/29/19 0534  HGB  --   --  8.7*  --   HCT  --   --  28.4*  --   PLT  --   --  225  --   LABPROT 27.3* 29.1*  --  30.6*  INR 2.5* 2.8*  --  2.9*  CREATININE  --   --  4.17*  --     Estimated Creatinine Clearance: 15.8 mL/min (A) (by C-G formula based on SCr of 4.17 mg/dL (H)).   Assessment: 78 yo female transferred from [facility] with atrial fibrillation and NSTEMI. No anticoagulation PTA. CHADSVASC 5. Of note, she was COVID + at her ALF 11/4. Patient also noted hx GIB in 07/2018. Plan is to follow up with cardiology on cardioversion after 4 weeks of therapeutic warfarin.   INR of 2.9 is therapeutic and stable. CBC stable on 12/11 and no reported bleeding.   Goal of Therapy:  INR 2-3 Monitor platelets by anticoagulation protocol: Yes   Plan:  Warfarin 6mg  x1 on 12/12 Monitor daily INR, CBC, and s/sx bleeding  Cristela Felt, PharmD PGY1 Pharmacy Resident [employer]: [phone]  09/29/2019 9:44 AM

## 2019-09-30 LAB — GLUCOSE, CAPILLARY
Glucose-Capillary: 203 mg/dL — ABNORMAL HIGH (ref 70–99)
Glucose-Capillary: 210 mg/dL — ABNORMAL HIGH (ref 70–99)
Glucose-Capillary: 215 mg/dL — ABNORMAL HIGH (ref 70–99)
Glucose-Capillary: 226 mg/dL — ABNORMAL HIGH (ref 70–99)
Glucose-Capillary: 255 mg/dL — ABNORMAL HIGH (ref 70–99)
Glucose-Capillary: 298 mg/dL — ABNORMAL HIGH (ref 70–99)

## 2019-09-30 LAB — PROTIME-INR
INR: 2.8 — ABNORMAL HIGH (ref 0.8–1.2)
Prothrombin Time: 29.2 seconds — ABNORMAL HIGH (ref 11.4–15.2)

## 2019-09-30 MED ORDER — WARFARIN SODIUM 6 MG PO TABS
6.0000 mg | ORAL_TABLET | Freq: Once | ORAL | Status: AC
  Administered 2019-09-30: 6 mg via ORAL
  Filled 2019-09-30: qty 1

## 2019-09-30 NOTE — Plan of Care (Signed)
For SNF

## 2019-09-30 NOTE — Progress Notes (Signed)
ANTICOAGULATION CONSULT NOTE - Follow Up Consult  Pharmacy Consult for warfarin Indication: atrial fibrillation  Allergies  Allergen Reactions  . Sulfa Antibiotics Hives  . Biaxin [Clarithromycin] Hives  . Buspar [Buspirone]   . Influenza A (H1n1) Mon[place] Vac Other (See Comments)    Pt states that she was told by her MD not to get the influenza vaccine.    . Morphine Other (See Comments)    Reaction:  Dizziness and confusion   . Pyridium [Phenazopyridine Hcl] Other (See Comments)    Reaction:  Unknown   . Tuberculin Tests   . Ceftriaxone Anxiety  . Latex Rash  . Prednisone Rash  . Tape Rash    Patient Measurements: Height: 5\' 7"  (170.2 cm) Weight: 276 lb 0.3 oz (125.2 kg) IBW/kg (Calculated) : 61.6 Heparin Dosing Weight: 91 kg  Vital Signs: Temp: 98.2 F (36.8 C) (12/13 0841) Temp Source: Oral (12/13 0841) BP: 145/40 (12/13 0841) Pulse Rate: 64 (12/13 0841)  Labs: Recent Labs    09/28/19 0325 09/28/19 0719 09/29/19 0534 09/30/19 0503  HGB  --  8.7*  --   --   HCT  --  28.4*  --   --   PLT  --  225  --   --   LABPROT 29.1*  --  30.6* 29.2*  INR 2.8*  --  2.9* 2.8*  CREATININE  --  4.17*  --   --     Estimated Creatinine Clearance: 15.8 mL/min (A) (by C-G formula based on SCr of 4.17 mg/dL (H)).   Assessment: 78 yo female transferred from [facility] with atrial fibrillation and NSTEMI. No anticoagulation PTA. CHADS2VASC 5. Of note, she was COVID + at her ALF 11/4. Patient also noted hx GIB in 07/2018. Plan is to follow up with cardiology on cardioversion after 4 weeks of therapeutic warfarin.   INR of 2.8 is therapeutic and stable. CBC stable on 12/11 and no reported bleeding.   Goal of Therapy:  INR 2-3 Monitor platelets by anticoagulation protocol: Yes   Plan:  Warfarin 6mg  x1 on 12/13 Monitor daily INR, CBC, and s/sx bleeding  Cristela Felt, PharmD PGY1 Pharmacy Resident [employer]: [phone]  09/30/2019 9:31 AM

## 2019-09-30 NOTE — Progress Notes (Signed)
[facility]  PROGRESS NOTE  NANETTE WIRSING PIR:[id number] DOB: 1941-10-13 DOA: 09/11/2019 PCP: System, Provider Not In Admit date - 09/11/2019   Admitting Physician Lavina Hamman, MD  Outpatient Primary MD for the patient is System, Provider Not In  LOS - 19 Brief Narrative   Carolyn Valdez is a 78 y.o. year old female with medical history significant for chronic HFpEF,n Afib/flutter, severe pulmonary hypertension, GI bleed (07/2018), hypertension, hyperlipidemia, diabetes mellitus, obesity, and ESRD on hemodialysis who presented to [facility] on 09/11/2019 with dyspnea, fatigue and palpitations not tolerating HD and was found to have atrial flutter with RVR, hypotension and  elevated troponin (2236) with global ST depressions initially admitted with working diagnosis of NSTEMI after evaluation by cardiology ( Dr. Fletcher Anon and Dr. Saunders Revel).  Patient was started on IV heparin and beta-blockade with initial plan for ischemic evaluation pending her Covid test; however her Covid test returned positive.  Quick downtrending of her initial troponin from 2030 36-2021 and 2 hours in setting respiratory stress related to COVID-19 infection seem more consistent with supply demand mismatch or subacute ACS per cardiology and they did not recommend urgent cardiac catheterization during this hospital stay.  Patient was transferred to [facility] on 11/24 given ESRD requiring hemodialysis and COVID-19 positive status.  Hospital course complicated by atrial flutter with RVR and elevated troponin presumed to be demand ischemia in the setting of concurrent COVID-19 infection, unable to get TEE/DCCV in setting of Covid positive infection per cardiology, acute on chronic CHF preserved EF exacerbation, Covid pneumonia requiring remdesivir therapy for 5 days and Decadron for 10 days  Subjective  Carolyn Valdez today improvement in dysuria.  Notices some pain with urination when having a bowel movement.  Otherwise no acute complaints A  & P  E.coli UTI.  Dysuria is improved today, UA positive for infection, remains afebrile no systemic signs or symptoms no history of MDR in the past.  Has had E faecalis UTI about a year ago.  -IV ceftriaxone switch to cefdenir based on urine cx sensitivities.  Atrial flutter, rate controlled.  -Continue metoprolol 75 mg twice daily, Coumadin  -Follow-up with Dr. Fletcher Anon in [place] in 4 weeks, to discuss cardioversion   Elevated troponin, felt to be demand ischemia in setting Covid positive, a flutter with RVR, and CHF exacerbation per cardiology.  Initially admitted with working dx of NSTEMI. TTE showed normal LV function  -Cardiology recommends further nonurgent ischemic evaluation as outpatient after full recovery from current hospitalization.  -Continue aspirin, statin, beta-blocker  Covid 19 pneumonia, resolved.  Previously diagnosed 11/4 at her ALF without need for hospitalization.  Completed remdesivir 5-day course and Decadron 10-day course.  Asymptomatic, no airborne/contact precautions given greater than 21 days since onset of positive test and improvement in symptoms per ID (12/4, Dr. Graylon Good) and CDC/cone guidelines  -negative Covid test on 12/8  -Repeat 12/10 for SNF also negative  ESRD on HD, MWF  -Tolerating HD in hospital, avoid nephrotoxins, monitor out  -continue phoslo  Acute on chronic diastolic., stable. euvolemic on my exam.   -Volume management with HD and torsemide on nondialysis days due to intradialytic weight gain  -Continue to closely monitor daily weights  Mild hyponatremia, stable. Ranging 129-130 over past 4 days.  Likely hypervolemic hyponatremia in setting of ESRD  -Daily BMP to monitor sodium, hopeful improvement with volume removal after HD  Anemia of CKD, hemoglobin stable at baseline -Monitor, daily CBC  Hypertension, at goal. Hypotension transiently on admission.   -  continue metoprolol  - hold home hydralazine and losartan  Type 2 diabetes.  Fasting glucose in 200s. A1c 7.9  -Hold home glipizide  -will increase Lantus from  26 to 30  units (home regimen 45 U)  History of seizure disorder, stable.  -Continue Keppra  Physical deconditioning.  -PT recommends skilled nursing facility for short-term rehab  --However patient is currently at her prior level of independence; Pt agrees,   --SNF denied,   -Education officer, museum working on returning to her ALF with home health therapy if possible  Mood disorder, stable.  -Continue Celexa, Zyprexa, Lamictal  Chronic hypoxic respiratory failure.  Patient stable on home O2 requirements of 3 L.  Likely multifactorial related to chronic diastolic heart failure, pulmonary arterial hypertension. -Continue O2     Family Communication  : We will update family  Code Status : DNR  Disposition Plan  : medically stable,  likely back to ALF with [facility] therapy Consults  : Cardiology, nephrology  Procedures  :  TTE (11/25)  DVT Prophylaxis  :  coumadin  Lab Results  Component Value Date   PLT 225 09/28/2019    Diet :  Diet Order            Diet renal/carb modified with fluid restriction Diet-HS Snack? Nothing; Fluid restriction: 1500 mL Fluid; Room service appropriate? Yes; Fluid consistency: Thin  Diet effective now               Inpatient Medications Scheduled Meds: . atorvastatin  40 mg Oral q1800  . calcium acetate  1,334 mg Oral TID AC  . [START ON 10/01/2019] cefdinir  300 mg Oral Q48H  . Chlorhexidine Gluconate Cloth  6 each Topical Q0600  . citalopram  10 mg Oral Daily  . docusate sodium  100 mg Oral BID  . feeding supplement (GLUCERNA SHAKE)  237 mL Oral TID BM  . insulin aspart  0-15 Units Subcutaneous TID AC & HS  . insulin aspart  10 Units Subcutaneous TID WC  . insulin glargine  30 Units Subcutaneous QHS  . lamoTRIgine  25 mg Oral Daily  . levETIRAcetam  250 mg Oral BID  . mouth rinse  15 mL Mouth Rinse BID  . metoprolol tartrate  75 mg Oral BID  . mirabegron ER   25 mg Oral Daily  . OLANZapine  5 mg Oral QHS  . pantoprazole  40 mg Oral Daily  . senna  1 tablet Oral BID  . sodium chloride flush  3 mL Intravenous Q12H  . torsemide  40 mg Oral Once per day on Sun Tue Thu Sat  . vitamin C  500 mg Oral Daily  . warfarin  6 mg Oral ONCE-1800  . Warfarin - Pharmacist Dosing Inpatient   Does not apply q1800  . zinc sulfate  220 mg Oral Daily   Continuous Infusions:  PRN Meds:.acetaminophen, albuterol, bisacodyl, chlorpheniramine-HYDROcodone, clonazePAM, guaiFENesin-dextromethorphan, heparin, magic mouthwash w/lidocaine, ondansetron **OR** ondansetron (ZOFRAN) IV, oxyCODONE, phenylephrine-shark liver oil-mineral oil-petrolatum, polyethylene glycol  Antibiotics  :   Anti-infectives (From admission, onward)   Start     Dose/Rate Route Frequency Ordered Stop   10/01/19 2000  cefdinir (OMNICEF) capsule 300 mg    Note to Pharmacy: Post dialysis   300 mg Oral Every 48 hours 09/29/19 1703     09/27/19 0900  cefTRIAXone (ROCEPHIN) 1 g in sodium chloride 0.9 % 100 mL IVPB     1 g 200 mL/hr over 30 Minutes Intravenous Every 24 hours 09/27/19  1610 09/30/19 1009   09/16/19 1115  cefTRIAXone (ROCEPHIN) 2 g in sodium chloride 0.9 % 100 mL IVPB  Status:  Discontinued     2 g 200 mL/hr over 30 Minutes Intravenous Every 24 hours 09/16/19 1110 09/20/19 1021   09/14/19 1000  remdesivir 100 mg in sodium chloride 0.9 % 250 mL IVPB     100 mg 500 mL/hr over 30 Minutes Intravenous Every 24 hours 09/13/19 0858 09/17/19 0932   09/13/19 1000  remdesivir 200 mg in sodium chloride 0.9 % 250 mL IVPB     200 mg 500 mL/hr over 30 Minutes Intravenous Once 09/13/19 0858 09/13/19 1126   09/12/19 2217  remdesivir 100 mg in sodium chloride 0.9 % 250 mL IVPB  Status:  Discontinued     100 mg 500 mL/hr over 30 Minutes Intravenous Every 24 hours 09/11/19 2224 09/11/19 2252   09/11/19 2230  remdesivir 200 mg in sodium chloride 0.9 % 250 mL IVPB  Status:  Discontinued     200 mg 500  mL/hr over 30 Minutes Intravenous Once 09/11/19 2224 09/11/19 2252       Objective   Vitals:   09/29/19 1747 09/29/19 2009 09/30/19 0416 09/30/19 0841  BP: (!) 141/46 (!) 155/51 (!) 151/50 (!) 145/40  Pulse: (!) 59 (!) 59 62 64  Resp: 18 16 16 18   Temp: 98 F (36.7 C) 98.1 F (36.7 C) 98.1 F (36.7 C) 98.2 F (36.8 C)  TempSrc: Oral Oral  Oral  SpO2: 100% 98% 98% 99%  Weight:  125.2 kg    Height:        SpO2: 99 % O2 Flow Rate (L/min): 2 L/min  Wt Readings from Last 3 Encounters:  09/29/19 125.2 kg  09/10/19 117.9 kg  08/16/19 112.3 kg     Intake/Output Summary (Last 24 hours) at 09/30/2019 1658 Last data filed at 09/30/2019 1300 Gross per 24 hour  Intake 480 ml  Output 4 ml  Net 476 ml    Physical Exam:  Obese elderly female, in no acute distress Awake Alert, Oriented X 4, Normal affect Symmetrical Chest wall movement, Good air movement bilaterally on 3 L. CTAB RRR,No Gallops,Rubs or new Murmurs,  +ve B.Sounds, Abd Soft, suprapubic tenderness, No organomegaly appreciated, No rebound, guarding or rigidity. No Cyanosis, Clubbing or edema, No new Rash or bruise    I have personally reviewed the following:   Data Reviewed:  CBC Recent Labs  Lab 09/26/19 0728 09/28/19 0719  WBC 7.1 5.9  HGB 8.5* 8.7*  HCT 27.7* 28.4*  PLT 199 225  MCV 94.2 94.0  MCH 28.9 28.8  MCHC 30.7 30.6  RDW 15.6* 15.2    Chemistries  Recent Labs  Lab 09/26/19 0728 09/28/19 0719  NA 128* 129*  K 4.1 3.8  CL 90* 94*  CO2 27 26  GLUCOSE 212* 218*  BUN 36* 23  CREATININE 4.38* 4.17*  CALCIUM 8.6* 9.0   ------------------------------------------------------------------------------------------------------------------ No results for input(s): CHOL, HDL, LDLCALC, TRIG, CHOLHDL, LDLDIRECT in the last 72 hours.  Lab Results  Component Value Date   HGBA1C 7.9 (H) 09/10/2019    ------------------------------------------------------------------------------------------------------------------ No results for input(s): TSH, T4TOTAL, T3FREE, THYROIDAB in the last 72 hours.  Invalid input(s): FREET3 ------------------------------------------------------------------------------------------------------------------ No results for input(s): VITAMINB12, FOLATE, FERRITIN, TIBC, IRON, RETICCTPCT in the last 72 hours.  Coagulation profile Recent Labs  Lab 09/26/19 0208 09/27/19 0632 09/28/19 0325 09/29/19 0534 09/30/19 0503  INR 2.3* 2.5* 2.8* 2.9* 2.8*    No results for  input(s): DDIMER in the last 72 hours.  Cardiac Enzymes No results for input(s): CKMB, TROPONINI, MYOGLOBIN in the last 168 hours.  Invalid input(s): CK ------------------------------------------------------------------------------------------------------------------    Component Value Date/Time   BNP 869.0 (H) 09/10/2019 1329    Micro Results Recent Results (from the past 240 hour(s))  SARS CORONAVIRUS 2 (TAT 6-24 HRS) Nasopharyngeal Nasopharyngeal Swab     Status: None   Collection Time: 09/25/19  5:18 PM   Specimen: Nasopharyngeal Swab  Result Value Ref Range Status   SARS Coronavirus 2 NEGATIVE NEGATIVE Final    Comment: (NOTE) SARS-CoV-2 target nucleic acids are NOT DETECTED. The SARS-CoV-2 RNA is generally detectable in upper and lower respiratory specimens during the acute phase of infection. Negative results do not preclude SARS-CoV-2 infection, do not rule out co-infections with other pathogens, and should not be used as the sole basis for treatment or other patient management decisions. Negative results must be combined with clinical observations, patient history, and epidemiological information. The expected result is Negative. Fact Sheet for Patients: SugarRoll.be Fact Sheet for Healthcare  Providers: https://www.woods-mathews.com/ This test is not yet approved or cleared by the [place] FDA and  has been authorized for detection and/or diagnosis of SARS-CoV-2 by FDA under an Emergency Use Authorization (EUA). This EUA will remain  in effect (meaning this test can be used) for the duration of the COVID-19 declaration under Section 56 4(b)(1) of the Act, 21 U.S.C. section 360bbb-3(b)(1), unless the authorization is terminated or revoked sooner. Performed at [facility], [place] [address], [place], [place] [address]   Culture, Urine     Status: Abnormal   Collection Time: 09/26/19  2:51 PM   Specimen: Urine, Clean Catch  Result Value Ref Range Status   Specimen Description URINE, CLEAN CATCH  Final   Special Requests   Final    Normal Performed at [facility], [place] [address], [place], [place] [address]    Culture (A)  Final    60,000 COLONIES/mL ESCHERICHIA COLI 60,000 COLONIES/mL YEAST    Report Status 09/29/2019 FINAL  Final   Organism ID, Bacteria ESCHERICHIA COLI (A)  Final      Susceptibility   Escherichia coli - MIC*    AMPICILLIN >=32 RESISTANT Resistant     CEFAZOLIN >=64 RESISTANT Resistant     CEFTRIAXONE <=1 SENSITIVE Sensitive     CIPROFLOXACIN >=4 RESISTANT Resistant     GENTAMICIN <=1 SENSITIVE Sensitive     IMIPENEM <=0.25 SENSITIVE Sensitive     NITROFURANTOIN <=16 SENSITIVE Sensitive     TRIMETH/SULFA <=20 SENSITIVE Sensitive     AMPICILLIN/SULBACTAM >=32 RESISTANT Resistant     PIP/TAZO >=128 RESISTANT Resistant     * 60,000 COLONIES/mL ESCHERICHIA COLI  SARS CORONAVIRUS 2 (TAT 6-24 HRS) Nasopharyngeal Nasopharyngeal Swab     Status: None   Collection Time: 09/27/19  1:35 PM   Specimen: Nasopharyngeal Swab  Result Value Ref Range Status   SARS Coronavirus 2 NEGATIVE NEGATIVE Final    Comment: (NOTE) SARS-CoV-2 target nucleic acids are NOT DETECTED. The SARS-CoV-2 RNA is generally detectable in upper and  lower respiratory specimens during the acute phase of infection. Negative results do not preclude SARS-CoV-2 infection, do not rule out co-infections with other pathogens, and should not be used as the sole basis for treatment or other patient management decisions. Negative results must be combined with clinical observations, patient history, and epidemiological information. The expected result is Negative. Fact Sheet for Patients: SugarRoll.be Fact Sheet for  Healthcare Providers: https://www.woods-mathews.com/ This test is not yet approved or cleared by the [place] and  has been authorized for detection and/or diagnosis of SARS-CoV-2 by FDA under an Emergency Use Authorization (EUA). This EUA will remain  in effect (meaning this test can be used) for the duration of the COVID-19 declaration under Section 56 4(b)(1) of the Act, 21 U.S.C. section 360bbb-3(b)(1), unless the authorization is terminated or revoked sooner. Performed at [facility], [place] [address], [place], [place] [address]     Radiology Reports Portable chest 1 View  Result Date: 09/12/2019 CLINICAL DATA:  Shortness of breath. EXAM: PORTABLE CHEST 1 VIEW COMPARISON:  09/10/2019. FINDINGS: Right-sided dialysis catheter in stable position. Stable cardiomegaly and mild pulmonary vascular congestion noted. Mild bibasilar atelectasis with improved aeration from prior exam. Small left pleural effusion cannot be excluded. Biapical pleural thickening again noted most consistent scarring. No pneumothorax. IMPRESSION: 1.  Right-sided dialysis catheter stable position. 2. Stable cardiomegaly and mild pulmonary vascular congestion noted. 3. Mild bibasilar atelectasis with improved aeration from prior exam. Small left pleural effusion cannot be excluded. Electronically Signed   By: Marcello Moores  Register   On: 09/12/2019 07:51   DG Chest Port 1 View  Result Date: 09/10/2019 CLINICAL  DATA:  78 year old female with shortness of breath. EXAM: PORTABLE CHEST 1 VIEW COMPARISON:  Chest radiograph dated 07/02/2019. FINDINGS: Right-sided dialysis catheter in similar position. There is stable cardiomegaly with mild vascular congestion. No edema. No focal consolidation or pneumothorax. Right apical subpleural crescentic density may represent scarring or trace loculated effusion. There is atherosclerotic calcification of the aortic arch. No acute osseous pathology. IMPRESSION: 1. Stable cardiomegaly with mild vascular congestion. No focal consolidation or pneumothorax. 2. Right apical subpleural crescentic density may represent trace pleural effusion. Electronically Signed   By: Anner Crete M.D.   On: 09/10/2019 13:48   DG Abd Portable 1V  Result Date: 09/11/2019 CLINICAL DATA:  Abdominal pain and a COVID-19 positive patient. EXAM: PORTABLE ABDOMEN - 1 VIEW COMPARISON:  None. FINDINGS: The bowel gas pattern is normal. No radio-opaque calculi or other significant radiographic abnormality are seen. IMPRESSION: Negative exam. Electronically Signed   By: Inge Rise M.D.   On: 09/11/2019 09:53   ECHOCARDIOGRAM LIMITED  Result Date: 09/12/2019   ECHOCARDIOGRAM LIMITED REPORT   Patient Name:   Carolyn Valdez [place] Date of Exam: 09/12/2019 Medical Rec #:  [id number]   Height:       67.0 in Accession #:    [id number]  Weight:       274.3 lb Date of Birth:  1941-06-01   BSA:          2.31 m Patient Age:    40 years    BP:           117/71 mmHg Patient Gender: F           HR:           126 bpm. Exam Location:  Inpatient  Procedure: Limited Echo, Limited Color Doppler and Cardiac Doppler Indications:    ACS (acute coronary syndrome) ([place]) [[id number]]  History:        Patient has prior history of Echocardiogram examinations, most                 recent 03/02/2019. COVID-19. Chronic kidney disease. Past history                 of HFpEF, breast cancer.  Sonographer:    Darlina Sicilian  RDCS Referring Phys: [id number]  ADAM ROSS SCHERTZ  Sonographer Comments: Technically difficult study due to poor echo windows. IMPRESSIONS  1. Left ventricular ejection fraction, by visual estimation, is 60 to 65%. The left ventricle has normal function.  2. Left ventricular diastolic function could not be evaluated.  3. Global right ventricle has mildly reduced systolic function.The right ventricular size is not well visualized. Mildly increased right ventricular wall thickness.  4. Moderate mitral annular calcification.  5. The mitral valve is abnormal. No evidence of mitral valve regurgitation. No evidence of mitral stenosis.  6. The aortic valve is grossly normal. Aortic valve regurgitation is not visualized. Mild aortic valve sclerosis without stenosis.  7. The tricuspid valve is not well visualized. Tricuspid valve regurgitation is trivial.  8. The pulmonic valve was grossly normal. Pulmonic valve regurgitation is trivial.  9. Trivial posterior pericardial effusion is present. FINDINGS  Left Ventricle: Left ventricular ejection fraction, by visual estimation, is 55 to 60%. The left ventricle has normal function. There is no left ventricular hypertrophy. Left ventricular diastolic function could not be evaluated. Right Ventricle: The right ventricular size is not well visualized. Mildly increased right ventricular wall thickness. Global RV systolic function is has mildly reduced systolic function. Left Atrium: Left atrial size was not assessed. Right Atrium: Right atrial size was not assessed Pericardium: Trivial pericardial effusion is present. Mitral Valve: The mitral valve is abnormal. Moderate mitral annular calcification. No evidence of mitral valve stenosis by observation. No evidence of mitral valve regurgitation. Tricuspid Valve: The tricuspid valve is not well visualized. Tricuspid valve regurgitation is trivial. Aortic Valve: The aortic valve is grossly normal. Aortic valve regurgitation is not visualized. Mild aortic valve  sclerosis is present, with no evidence of aortic valve stenosis. Pulmonic Valve: The pulmonic valve was grossly normal. Pulmonic valve regurgitation is trivial. Aorta: Aortic root could not be assessed. Shunts: The interatrial septum was not assessed.  Cherlynn Kaiser MD Electronically signed by Cherlynn Kaiser MD Signature Date/Time: 09/12/2019/5:07:08 PM    Final      Time Spent in minutes  30     Desiree Hane M.D on 09/30/2019 at 4:58 PM  To page go to [email or web address] - password [facility]

## 2019-09-30 NOTE — Progress Notes (Signed)
Patient ID: Carolyn Valdez, female   DOB: January 23, 1941, 77 y.o.   MRN: [id number] [facility] Progress Note   Assessment/ Plan:   1.  COVID-19 infection: Respiratory status back to baseline following remdesivir/corticosteroids.  Course complicated by non-STEMI that is ongoing medical management. 2. ESRD: Continue HD on her usual Monday/Wednesday/Friday schedule with torsemide on nondialysis days for continued volume management/hyponatremia.  Anticipate she will return back to her [facility] unit upon discharge with anticipated return back to [place]. 3. Anemia: Status post ESA, no overt blood loss.  Monitor with hemodialysis/UF. 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 5.  Atrial flutter/atrial fibrillation with RVR: With RVR noted overnight, continue beta-blocker.  On Coumadin. 6. Hypertension: Blood pressure under acceptable control, continue to monitor with hemodialysis/current medications. 7.  E. coli urinary tract infection: Transitioned by primary team to [facility] based on culture reports.  Subjective:   Complains of nausea this morning after eating breakfast.  Improving lower abdominal/pelvic pain.   Objective:   BP (!) 145/40 (BP Location: Left Arm)   Pulse 64   Temp 98.2 F (36.8 C) (Oral)   Resp 18   Ht 5\' 7"  (1.702 m)   Wt 125.2 kg   SpO2 99%   BMI 43.23 kg/m   Physical Exam: Gen: Appears uncomfortable resting in bed, intermittent retching CVS: Pulse regular rhythm, normal rate, S1 and S2 normal Resp: Anteriorly clear to auscultation.  No rales/rhonchi.  Right IJ TDC Abd: Soft, obese, nontender Ext: Trace right lower extremity edema, 1+ left ankle edema  Labs: BMET Recent Labs  Lab 09/26/19 0728 09/28/19 0719  NA 128* 129*  K 4.1 3.8  CL 90* 94*  CO2 27 26  GLUCOSE 212* 218*  BUN 36* 23  CREATININE 4.38* 4.17*  CALCIUM 8.6* 9.0  PHOS 3.0 3.8   CBC Recent Labs  Lab  09/26/19 0728 09/28/19 0719  WBC 7.1 5.9  HGB 8.5* 8.7*  HCT 27.7* 28.4*  MCV 94.2 94.0  PLT 199 225   Medications:    . atorvastatin  40 mg Oral q1800  . calcium acetate  1,334 mg Oral TID AC  . [START ON 10/01/2019] cefdinir  300 mg Oral Q48H  . Chlorhexidine Gluconate Cloth  6 each Topical Q0600  . citalopram  10 mg Oral Daily  . docusate sodium  100 mg Oral BID  . feeding supplement (GLUCERNA SHAKE)  237 mL Oral TID BM  . insulin aspart  0-15 Units Subcutaneous TID AC & HS  . insulin aspart  10 Units Subcutaneous TID WC  . insulin glargine  30 Units Subcutaneous QHS  . lamoTRIgine  25 mg Oral Daily  . levETIRAcetam  250 mg Oral BID  . mouth rinse  15 mL Mouth Rinse BID  . metoprolol tartrate  75 mg Oral BID  . mirabegron ER  25 mg Oral Daily  . OLANZapine  5 mg Oral QHS  . pantoprazole  40 mg Oral Daily  . senna  1 tablet Oral BID  . sodium chloride flush  3 mL Intravenous Q12H  . torsemide  40 mg Oral Once per day on Sun Tue Thu Sat  . vitamin C  500 mg Oral Daily  . warfarin  6 mg Oral ONCE-1800  . Warfarin - Pharmacist Dosing Inpatient   Does not apply q1800  . zinc sulfate  220 mg Oral Daily   Elmarie Shiley, MD 09/30/2019, 9:40 AM

## 2019-10-01 LAB — RENAL FUNCTION PANEL
Albumin: 2.5 g/dL — ABNORMAL LOW (ref 3.5–5.0)
Anion gap: 10 (ref 5–15)
BUN: 28 mg/dL — ABNORMAL HIGH (ref 8–23)
CO2: 26 mmol/L (ref 22–32)
Calcium: 9.2 mg/dL (ref 8.9–10.3)
Chloride: 93 mmol/L — ABNORMAL LOW (ref 98–111)
Creatinine, Ser: 4.72 mg/dL — ABNORMAL HIGH (ref 0.44–1.00)
GFR calc Af Amer: 10 mL/min — ABNORMAL LOW (ref >60)
GFR calc non Af Amer: 8 mL/min — ABNORMAL LOW (ref >60)
Glucose, Bld: 295 mg/dL — ABNORMAL HIGH (ref 70–99)
Phosphorus: 3.4 mg/dL (ref 2.5–4.6)
Potassium: 4.3 mmol/L (ref 3.5–5.1)
Sodium: 129 mmol/L — ABNORMAL LOW (ref 135–145)

## 2019-10-01 LAB — GLUCOSE, CAPILLARY
Glucose-Capillary: 145 mg/dL — ABNORMAL HIGH (ref 70–99)
Glucose-Capillary: 198 mg/dL — ABNORMAL HIGH (ref 70–99)
Glucose-Capillary: 229 mg/dL — ABNORMAL HIGH (ref 70–99)
Glucose-Capillary: 235 mg/dL — ABNORMAL HIGH (ref 70–99)
Glucose-Capillary: 268 mg/dL — ABNORMAL HIGH (ref 70–99)
Glucose-Capillary: 271 mg/dL — ABNORMAL HIGH (ref 70–99)

## 2019-10-01 LAB — CBC
HCT: 26.5 % — ABNORMAL LOW (ref 36.0–46.0)
Hemoglobin: 8.5 g/dL — ABNORMAL LOW (ref 12.0–15.0)
MCH: 29.1 pg (ref 26.0–34.0)
MCHC: 32.1 g/dL (ref 30.0–36.0)
MCV: 90.8 fL (ref 80.0–100.0)
Platelets: 246 10*3/uL (ref 150–400)
RBC: 2.92 MIL/uL — ABNORMAL LOW (ref 3.87–5.11)
RDW: 14.7 % (ref 11.5–15.5)
WBC: 9.1 10*3/uL (ref 4.0–10.5)
nRBC: 0 % (ref 0.0–0.2)

## 2019-10-01 LAB — SARS CORONAVIRUS 2 (TAT 6-24 HRS): SARS Coronavirus 2: NEGATIVE

## 2019-10-01 LAB — PROTIME-INR
INR: 2.7 — ABNORMAL HIGH (ref 0.8–1.2)
Prothrombin Time: 28.3 seconds — ABNORMAL HIGH (ref 11.4–15.2)

## 2019-10-01 MED ORDER — HEPARIN SODIUM (PORCINE) 1000 UNIT/ML IJ SOLN
INTRAMUSCULAR | Status: AC
  Filled 2019-10-01: qty 4

## 2019-10-01 MED ORDER — HEPARIN SODIUM (PORCINE) 1000 UNIT/ML DIALYSIS
40.0000 [IU]/kg | INTRAMUSCULAR | Status: DC | PRN
  Administered 2019-10-01: 3200 [IU] via INTRAVENOUS_CENTRAL
  Filled 2019-10-01 (×2): qty 5

## 2019-10-01 MED ORDER — DARBEPOETIN ALFA 60 MCG/0.3ML IJ SOSY: 60.0000 ug | PREFILLED_SYRINGE | INTRAMUSCULAR | Status: DC

## 2019-10-01 MED ORDER — INSULIN GLARGINE 100 UNIT/ML ~~LOC~~ SOLN
35.0000 [IU] | Freq: Every day | SUBCUTANEOUS | Status: DC
  Administered 2019-10-01: 35 [IU] via SUBCUTANEOUS
  Filled 2019-10-01 (×2): qty 0.35

## 2019-10-01 MED ORDER — WARFARIN SODIUM 6 MG PO TABS
6.0000 mg | ORAL_TABLET | Freq: Once | ORAL | Status: AC
  Administered 2019-10-01: 6 mg via ORAL
  Filled 2019-10-01: qty 1

## 2019-10-01 NOTE — Progress Notes (Signed)
ANTICOAGULATION CONSULT NOTE - Follow Up Consult  Pharmacy Consult for warfarin Indication: atrial fibrillation  Allergies  Allergen Reactions  . Sulfa Antibiotics Hives  . Biaxin [Clarithromycin] Hives  . Buspar [Buspirone]   . Influenza A (H1n1) Monoval Vac Other (See Comments)    Pt states that she was told by her MD not to get the influenza vaccine.    . Morphine Other (See Comments)    Reaction:  Dizziness and confusion   . Pyridium [Phenazopyridine Hcl] Other (See Comments)    Reaction:  Unknown   . Tuberculin Tests   . Ceftriaxone Anxiety  . Latex Rash  . Prednisone Rash  . Tape Rash    Patient Measurements: Height: 5\' 7"  (170.2 cm) Weight: 276 lb 2 oz (125.3 kg) IBW/kg (Calculated) : 61.6 Heparin Dosing Weight: 91 kg  Vital Signs: Temp: 99 F (37.2 C) (12/14 0803) Temp Source: Oral (12/14 0803) BP: 154/51 (12/14 0803) Pulse Rate: 65 (12/14 0803)  Labs: Recent Labs    09/29/19 0534 09/30/19 0503 10/01/19 0603  HGB  --   --  8.5*  HCT  --   --  26.5*  PLT  --   --  246  LABPROT 30.6* 29.2* 28.3*  INR 2.9* 2.8* 2.7*    Estimated Creatinine Clearance: 15.8 mL/min (A) (by C-G formula based on SCr of 4.17 mg/dL (H)).   Assessment: 78 yo female transferred from [facility] with atrial fibrillation and NSTEMI. No anticoagulation PTA. CHADS2VASC 5. Of note, she was COVID + at her ALF 11/4. Patient also noted hx GIB in 07/2018. Plan is to follow up with cardiology on cardioversion after 4 weeks of therapeutic warfarin.   INR today remains therapeutic (INR 2.7 << 2.8, goal of 2-3). CBC stable - no bleeding noted.   Goal of Therapy:  INR 2-3 Monitor platelets by anticoagulation protocol: Yes   Plan:  - Warfarin 6 mg x 1 dose at 1800 today - Daily PT/INR, CBC q72h - Will continue to monitor for any signs/symptoms of bleeding and will follow up with PT/INR in the a.m.    Thank you for allowing pharmacy to be a part of this patient's care.  Alycia Rossetti,  PharmD, BCPS Clinical Pharmacist Clinical phone for 10/01/2019: [phone] 10/01/2019 8:40 AM   **Pharmacist phone directory can now be found on [email or web address] ([email or web address]).  Listed under [place].

## 2019-10-01 NOTE — Care Management Important Message (Signed)
Important Message  Patient Details  Name: Carolyn Valdez MRN: [id number] Date of Birth: August 21, 1941   Medicare Important Message Given:  Yes     Orbie Pyo 10/01/2019, 10:24 AM

## 2019-10-01 NOTE — Progress Notes (Signed)
[facility]  PROGRESS NOTE  Carolyn Valdez XLK:[id number] DOB: 1941/07/12 DOA: 09/11/2019 PCP: System, Provider Not In Admit date - 09/11/2019   Admitting Physician Lavina Hamman, MD  Outpatient Primary MD for the patient is System, Provider Not In  LOS - 44 Brief Narrative   Carolyn Valdez is a 78 y.o. year old female with medical history significant for chronic HFpEF,n Afib/flutter, severe pulmonary hypertension, GI bleed (07/2018), hypertension, hyperlipidemia, diabetes mellitus, obesity, and ESRD on hemodialysis who presented to [facility] on 09/11/2019 with dyspnea, fatigue and palpitations not tolerating HD and was found to have atrial flutter with RVR, hypotension and  elevated troponin (2236) with global ST depressions initially admitted with working diagnosis of NSTEMI after evaluation by cardiology ( Dr. Fletcher Anon and Dr. Saunders Revel).  Patient was started on IV heparin and beta-blockade with initial plan for ischemic evaluation pending her Covid test; however her Covid test returned positive.  Quick downtrending of her initial troponin from 2030 36-2021 and 2 hours in setting respiratory stress related to COVID-19 infection seem more consistent with supply demand mismatch or subacute ACS per cardiology and they did not recommend urgent cardiac catheterization during this hospital stay.  Patient was transferred to [facility] on 11/24 given ESRD requiring hemodialysis and COVID-19 positive status.  Hospital course complicated by atrial flutter with RVR and elevated troponin presumed to be demand ischemia in the setting of concurrent COVID-19 infection, unable to get TEE/DCCV in setting of Covid positive infection per cardiology, acute on chronic CHF preserved EF exacerbation, Covid pneumonia requiring remdesivir therapy for 5 days and Decadron for 10 days  Subjective  Patient not seen on floor. In dialysis session A & P  E.coli UTI.  Dysuria is improved today, UA positive for infection, remains  afebrile no systemic signs or symptoms no history of MDR in the past.  Has had E faecalis UTI about a year ago.  -IV ceftriaxone switch to cefdenir based on urine cx sensitivities.  Atrial flutter, rate controlled.  -Continue metoprolol 75 mg twice daily, Coumadin  -Follow-up with Dr. Fletcher Anon in [place] in 4 weeks, to discuss cardioversion   Elevated troponin, felt to be demand ischemia in setting Covid positive, a flutter with RVR, and CHF exacerbation per cardiology.  Initially admitted with working dx of NSTEMI. TTE showed normal LV function  -Cardiology recommends further nonurgent ischemic evaluation as outpatient after full recovery from current hospitalization.  -Continue aspirin, statin, beta-blocker  Covid 19 pneumonia, resolved.  Previously diagnosed 11/4 at her ALF without need for hospitalization.  Completed remdesivir 5-day course and Decadron 10-day course.  Asymptomatic, no airborne/contact precautions given greater than 21 days since onset of positive test and improvement in symptoms per ID (12/4, Dr. Graylon Good) and CDC/cone guidelines  -negative Covid test on 12/8  -Repeat 12/10 for SNF also negative  ESRD on HD, MWF  -Tolerating HD in hospital, avoid nephrotoxins, monitor out  -continue phoslo  Acute on chronic diastolic., stable. euvolemic on my exam.   -Volume management with HD and torsemide on nondialysis days due to intradialytic weight gain  -Continue to closely monitor daily weights  Mild hyponatremia, stable. Ranging 129-130 over past 4 days.  Likely hypervolemic hyponatremia in setting of ESRD  -Daily BMP to monitor sodium, hopeful improvement with volume removal after HD  Anemia of CKD, hemoglobin stable at baseline -Monitor, daily CBC  Hypertension, at goal. Hypotension transiently on admission.   -continue metoprolol  - hold home hydralazine and losartan  Type 2 diabetes.  Fasting glucose in 200s. A1c 7.9  -Hold home glipizide  -will increase Lantus from   26 to 30  units (home regimen 45 U)  History of seizure disorder, stable.  -Continue Keppra  Physical deconditioning.  -PT recommends skilled nursing facility for short-term rehab  --However patient is currently at her prior level of independence; Pt agrees,   --SNF denied,   -Education officer, museum working on returning to her ALF with home health therapy if possible  Mood disorder, stable.  -Continue Celexa, Zyprexa, Lamictal  Chronic hypoxic respiratory failure.  Patient stable on home O2 requirements of 3 L.  Likely multifactorial related to chronic diastolic heart failure, pulmonary arterial hypertension. -Continue O2     Family Communication  : We will update family  Code Status : DNR  Disposition Plan  : medically stable,  likely back to ALF with [facility] therapy Consults  : Cardiology, nephrology  Procedures  :  TTE (11/25)  DVT Prophylaxis  :  coumadin  Lab Results  Component Value Date   PLT 246 10/01/2019    Diet :  Diet Order            Diet renal/carb modified with fluid restriction Diet-HS Snack? Nothing; Fluid restriction: 1500 mL Fluid; Room service appropriate? Yes; Fluid consistency: Thin  Diet effective now               Inpatient Medications Scheduled Meds: . atorvastatin  40 mg Oral q1800  . calcium acetate  1,334 mg Oral TID AC  . cefdinir  300 mg Oral Q48H  . Chlorhexidine Gluconate Cloth  6 each Topical Q0600  . citalopram  10 mg Oral Daily  . docusate sodium  100 mg Oral BID  . feeding supplement (GLUCERNA SHAKE)  237 mL Oral TID BM  . heparin      . insulin aspart  0-15 Units Subcutaneous TID AC & HS  . insulin aspart  10 Units Subcutaneous TID WC  . insulin glargine  35 Units Subcutaneous QHS  . lamoTRIgine  25 mg Oral Daily  . levETIRAcetam  250 mg Oral BID  . mouth rinse  15 mL Mouth Rinse BID  . metoprolol tartrate  75 mg Oral BID  . mirabegron ER  25 mg Oral Daily  . OLANZapine  5 mg Oral QHS  . pantoprazole  40 mg Oral Daily  . senna   1 tablet Oral BID  . sodium chloride flush  3 mL Intravenous Q12H  . torsemide  40 mg Oral Once per day on Sun Tue Thu Sat  . vitamin C  500 mg Oral Daily  . warfarin  6 mg Oral ONCE-1800  . Warfarin - Pharmacist Dosing Inpatient   Does not apply q1800  . zinc sulfate  220 mg Oral Daily   Continuous Infusions:  PRN Meds:.acetaminophen, albuterol, bisacodyl, chlorpheniramine-HYDROcodone, clonazePAM, guaiFENesin-dextromethorphan, heparin, magic mouthwash w/lidocaine, ondansetron **OR** ondansetron (ZOFRAN) IV, oxyCODONE, phenylephrine-shark liver oil-mineral oil-petrolatum, polyethylene glycol  Antibiotics  :   Anti-infectives (From admission, onward)   Start     Dose/Rate Route Frequency Ordered Stop   10/01/19 2000  cefdinir (OMNICEF) capsule 300 mg    Note to Pharmacy: Post dialysis   300 mg Oral Every 48 hours 09/29/19 1703     09/27/19 0900  cefTRIAXone (ROCEPHIN) 1 g in sodium chloride 0.9 % 100 mL IVPB     1 g 200 mL/hr over 30 Minutes Intravenous Every 24 hours 09/27/19 0806 09/30/19 1009   09/16/19 1115  cefTRIAXone (  ROCEPHIN) 2 g in sodium chloride 0.9 % 100 mL IVPB  Status:  Discontinued     2 g 200 mL/hr over 30 Minutes Intravenous Every 24 hours 09/16/19 1110 09/20/19 1021   09/14/19 1000  remdesivir 100 mg in sodium chloride 0.9 % 250 mL IVPB     100 mg 500 mL/hr over 30 Minutes Intravenous Every 24 hours 09/13/19 0858 09/17/19 0932   09/13/19 1000  remdesivir 200 mg in sodium chloride 0.9 % 250 mL IVPB     200 mg 500 mL/hr over 30 Minutes Intravenous Once 09/13/19 0858 09/13/19 1126   09/12/19 2217  remdesivir 100 mg in sodium chloride 0.9 % 250 mL IVPB  Status:  Discontinued     100 mg 500 mL/hr over 30 Minutes Intravenous Every 24 hours 09/11/19 2224 09/11/19 2252   09/11/19 2230  remdesivir 200 mg in sodium chloride 0.9 % 250 mL IVPB  Status:  Discontinued     200 mg 500 mL/hr over 30 Minutes Intravenous Once 09/11/19 2224 09/11/19 2252       Objective    Vitals:   10/01/19 1100 10/01/19 1130 10/01/19 1200 10/01/19 1229  BP: (!) 133/46 (!) 121/38 (!) 122/51 (!) 131/50  Pulse: 65 60 68 68  Resp:    20  Temp:    98 F (36.7 C)  TempSrc:    Oral  SpO2:    97%  Weight:    123.7 kg  Height:        SpO2: 97 % O2 Flow Rate (L/min): 2 L/min  Wt Readings from Last 3 Encounters:  10/01/19 123.7 kg  09/10/19 117.9 kg  08/16/19 112.3 kg     Intake/Output Summary (Last 24 hours) at 10/01/2019 1349 Last data filed at 10/01/2019 1229 Gross per 24 hour  Intake 120 ml  Output 2550 ml  Net -2430 ml    Physical Exam:  Pt not seen. In HD today   I have personally reviewed the following:   Data Reviewed:  CBC Recent Labs  Lab 09/26/19 0728 09/28/19 0719 10/01/19 0603  WBC 7.1 5.9 9.1  HGB 8.5* 8.7* 8.5*  HCT 27.7* 28.4* 26.5*  PLT 199 225 246  MCV 94.2 94.0 90.8  MCH 28.9 28.8 29.1  MCHC 30.7 30.6 32.1  RDW 15.6* 15.2 14.7    Chemistries  Recent Labs  Lab 09/26/19 0728 09/28/19 0719 10/01/19 0844  NA 128* 129* 129*  K 4.1 3.8 4.3  CL 90* 94* 93*  CO2 27 26 26   GLUCOSE 212* 218* 295*  BUN 36* 23 28*  CREATININE 4.38* 4.17* 4.72*  CALCIUM 8.6* 9.0 9.2   ------------------------------------------------------------------------------------------------------------------ No results for input(s): CHOL, HDL, LDLCALC, TRIG, CHOLHDL, LDLDIRECT in the last 72 hours.  Lab Results  Component Value Date   HGBA1C 7.9 (H) 09/10/2019   ------------------------------------------------------------------------------------------------------------------ No results for input(s): TSH, T4TOTAL, T3FREE, THYROIDAB in the last 72 hours.  Invalid input(s): FREET3 ------------------------------------------------------------------------------------------------------------------ No results for input(s): VITAMINB12, FOLATE, FERRITIN, TIBC, IRON, RETICCTPCT in the last 72 hours.  Coagulation profile Recent Labs  Lab 09/27/19 0632  09/28/19 0325 09/29/19 0534 09/30/19 0503 10/01/19 0603  INR 2.5* 2.8* 2.9* 2.8* 2.7*    No results for input(s): DDIMER in the last 72 hours.  Cardiac Enzymes No results for input(s): CKMB, TROPONINI, MYOGLOBIN in the last 168 hours.  Invalid input(s): CK ------------------------------------------------------------------------------------------------------------------    Component Value Date/Time   BNP 869.0 (H) 09/10/2019 1329    Micro Results Recent Results (from the past 240  hour(s))  SARS CORONAVIRUS 2 (TAT 6-24 HRS) Nasopharyngeal Nasopharyngeal Swab     Status: None   Collection Time: 09/25/19  5:18 PM   Specimen: Nasopharyngeal Swab  Result Value Ref Range Status   SARS Coronavirus 2 NEGATIVE NEGATIVE Final    Comment: (NOTE) SARS-CoV-2 target nucleic acids are NOT DETECTED. The SARS-CoV-2 RNA is generally detectable in upper and lower respiratory specimens during the acute phase of infection. Negative results do not preclude SARS-CoV-2 infection, do not rule out co-infections with other pathogens, and should not be used as the sole basis for treatment or other patient management decisions. Negative results must be combined with clinical observations, patient history, and epidemiological information. The expected result is Negative. Fact Sheet for Patients: SugarRoll.be Fact Sheet for Healthcare Providers: https://www.woods-mathews.com/ This test is not yet approved or cleared by the [place] FDA and  has been authorized for detection and/or diagnosis of SARS-CoV-2 by FDA under an Emergency Use Authorization (EUA). This EUA will remain  in effect (meaning this test can be used) for the duration of the COVID-19 declaration under Section 56 4(b)(1) of the Act, 21 U.S.C. section 360bbb-3(b)(1), unless the authorization is terminated or revoked sooner. Performed at [facility], [place] [address] [place],  [place] [address]   Culture, Urine     Status: Abnormal   Collection Time: 09/26/19  2:51 PM   Specimen: Urine, Clean Catch  Result Value Ref Range Status   Specimen Description URINE, CLEAN CATCH  Final   Special Requests   Final    Normal Performed at [facility], [place] [address] [place], [place] [address]    Culture (A)  Final    60,000 COLONIES/mL ESCHERICHIA COLI 60,000 COLONIES/mL YEAST    Report Status 09/29/2019 FINAL  Final   Organism ID, Bacteria ESCHERICHIA COLI (A)  Final      Susceptibility   Escherichia coli - MIC*    AMPICILLIN >=32 RESISTANT Resistant     CEFAZOLIN >=64 RESISTANT Resistant     CEFTRIAXONE <=1 SENSITIVE Sensitive     CIPROFLOXACIN >=4 RESISTANT Resistant     GENTAMICIN <=1 SENSITIVE Sensitive     IMIPENEM <=0.25 SENSITIVE Sensitive     NITROFURANTOIN <=16 SENSITIVE Sensitive     TRIMETH/SULFA <=20 SENSITIVE Sensitive     AMPICILLIN/SULBACTAM >=32 RESISTANT Resistant     PIP/TAZO >=128 RESISTANT Resistant     * 60,000 COLONIES/mL ESCHERICHIA COLI  SARS CORONAVIRUS 2 (TAT 6-24 HRS) Nasopharyngeal Nasopharyngeal Swab     Status: None   Collection Time: 09/27/19  1:35 PM   Specimen: Nasopharyngeal Swab  Result Value Ref Range Status   SARS Coronavirus 2 NEGATIVE NEGATIVE Final    Comment: (NOTE) SARS-CoV-2 target nucleic acids are NOT DETECTED. The SARS-CoV-2 RNA is generally detectable in upper and lower respiratory specimens during the acute phase of infection. Negative results do not preclude SARS-CoV-2 infection, do not rule out co-infections with other pathogens, and should not be used as the sole basis for treatment or other patient management decisions. Negative results must be combined with clinical observations, patient history, and epidemiological information. The expected result is Negative. Fact Sheet for Patients: SugarRoll.be Fact Sheet for Healthcare  Providers: https://www.woods-mathews.com/ This test is not yet approved or cleared by the [place] FDA and  has been authorized for detection and/or diagnosis of SARS-CoV-2 by FDA under an Emergency Use Authorization (EUA). This EUA will remain  in effect (meaning this test can be used) for the duration  of the COVID-19 declaration under Section 56 4(b)(1) of the Act, 21 U.S.C. section 360bbb-3(b)(1), unless the authorization is terminated or revoked sooner. Performed at [facility], [place] [address] [place], [place] [address]     Radiology Reports Portable chest 1 View  Result Date: 09/12/2019 CLINICAL DATA:  Shortness of breath. EXAM: PORTABLE CHEST 1 VIEW COMPARISON:  09/10/2019. FINDINGS: Right-sided dialysis catheter in stable position. Stable cardiomegaly and mild pulmonary vascular congestion noted. Mild bibasilar atelectasis with improved aeration from prior exam. Small left pleural effusion cannot be excluded. Biapical pleural thickening again noted most consistent scarring. No pneumothorax. IMPRESSION: 1.  Right-sided dialysis catheter stable position. 2. Stable cardiomegaly and mild pulmonary vascular congestion noted. 3. Mild bibasilar atelectasis with improved aeration from prior exam. Small left pleural effusion cannot be excluded. Electronically Signed   By: Marcello Moores  Register   On: 09/12/2019 07:51   DG Chest Port 1 View  Result Date: 09/10/2019 CLINICAL DATA:  78 year old female with shortness of breath. EXAM: PORTABLE CHEST 1 VIEW COMPARISON:  Chest radiograph dated 07/02/2019. FINDINGS: Right-sided dialysis catheter in similar position. There is stable cardiomegaly with mild vascular congestion. No edema. No focal consolidation or pneumothorax. Right apical subpleural crescentic density may represent scarring or trace loculated effusion. There is atherosclerotic calcification of the aortic arch. No acute osseous pathology. IMPRESSION: 1. Stable  cardiomegaly with mild vascular congestion. No focal consolidation or pneumothorax. 2. Right apical subpleural crescentic density may represent trace pleural effusion. Electronically Signed   By: Anner Crete M.D.   On: 09/10/2019 13:48   DG Abd Portable 1V  Result Date: 09/11/2019 CLINICAL DATA:  Abdominal pain and a COVID-19 positive patient. EXAM: PORTABLE ABDOMEN - 1 VIEW COMPARISON:  None. FINDINGS: The bowel gas pattern is normal. No radio-opaque calculi or other significant radiographic abnormality are seen. IMPRESSION: Negative exam. Electronically Signed   By: Inge Rise M.D.   On: 09/11/2019 09:53   ECHOCARDIOGRAM LIMITED  Result Date: 09/12/2019   ECHOCARDIOGRAM LIMITED REPORT   Patient Name:   DYNASIA KERCHEVAL Shontz Date of Exam: 09/12/2019 Medical Rec #:  [id number]   Height:       67.0 in Accession #:    [id number]  Weight:       274.3 lb Date of Birth:  March 30, 1941   BSA:          2.31 m Patient Age:    39 years    BP:           117/71 mmHg Patient Gender: F           HR:           126 bpm. Exam Location:  Inpatient  Procedure: Limited Echo, Limited Color Doppler and Cardiac Doppler Indications:    ACS (acute coronary syndrome) ([place]) [[id number]]  History:        Patient has prior history of Echocardiogram examinations, most                 recent 03/02/2019. COVID-19. Chronic kidney disease. Past history                 of HFpEF, breast cancer.  Sonographer:    Darlina Sicilian RDCS Referring Phys: [phone] ADAM ROSS [place]  Sonographer Comments: Technically difficult study due to poor echo windows. IMPRESSIONS  1. Left ventricular ejection fraction, by visual estimation, is 60 to 65%. The left ventricle has normal function.  2. Left ventricular diastolic function could not be evaluated.  3. Global  right ventricle has mildly reduced systolic function.The right ventricular size is not well visualized. Mildly increased right ventricular wall thickness.  4. Moderate mitral annular calcification.  5. The  mitral valve is abnormal. No evidence of mitral valve regurgitation. No evidence of mitral stenosis.  6. The aortic valve is grossly normal. Aortic valve regurgitation is not visualized. Mild aortic valve sclerosis without stenosis.  7. The tricuspid valve is not well visualized. Tricuspid valve regurgitation is trivial.  8. The pulmonic valve was grossly normal. Pulmonic valve regurgitation is trivial.  9. Trivial posterior pericardial effusion is present. FINDINGS  Left Ventricle: Left ventricular ejection fraction, by visual estimation, is 55 to 60%. The left ventricle has normal function. There is no left ventricular hypertrophy. Left ventricular diastolic function could not be evaluated. Right Ventricle: The right ventricular size is not well visualized. Mildly increased right ventricular wall thickness. Global RV systolic function is has mildly reduced systolic function. Left Atrium: Left atrial size was not assessed. Right Atrium: Right atrial size was not assessed Pericardium: Trivial pericardial effusion is present. Mitral Valve: The mitral valve is abnormal. Moderate mitral annular calcification. No evidence of mitral valve stenosis by observation. No evidence of mitral valve regurgitation. Tricuspid Valve: The tricuspid valve is not well visualized. Tricuspid valve regurgitation is trivial. Aortic Valve: The aortic valve is grossly normal. Aortic valve regurgitation is not visualized. Mild aortic valve sclerosis is present, with no evidence of aortic valve stenosis. Pulmonic Valve: The pulmonic valve was grossly normal. Pulmonic valve regurgitation is trivial. Aorta: Aortic root could not be assessed. Shunts: The interatrial septum was not assessed.  Cherlynn Kaiser MD Electronically signed by Cherlynn Kaiser MD Signature Date/Time: 09/12/2019/5:07:08 PM    Final      Time Spent in minutes  30     Desiree Hane M.D on 10/01/2019 at 1:49 PM  To page go to [email or web address] - password  [facility]

## 2019-10-01 NOTE — Plan of Care (Signed)
  Problem: Clinical Measurements: Goal: Ability to maintain clinical measurements within normal limits will improve Outcome: Progressing   

## 2019-10-01 NOTE — Consult Note (Addendum)
[facility] Nurse Consult Note: Reason for Consult: Consult requested for a deep tissue pressure injury (DTPI)  to the sacrum. Consult performed remotely after review of the nursing flow-sheet and progress notes in the EMR.  Pt is currently in dialysis and unable to assess the location.  Primary team; please insert a photo into the EMR if possible to assist with documentation of the pressure injury.   Pressure Injury POA: No Wound bed: dark reddish purple/black, according to nursing flowsheet Drainage (amount, consistency, odor)  Dressing procedure/placement/frequency: Foam dressing to protect from further injury.  Air mattress to reduce pressure to the affected area.  DTPI are high risk to evolve into full thickness pressure injuries; [place] team wil re-assess the site next week to determine if enzymatic debridement needs to be added at that time.  Julien Girt MSN, RN, [place], [place], [place]

## 2019-10-01 NOTE — TOC Progression Note (Addendum)
Transition of Care [facility]) - Progression Note    Patient Details  Name: Carolyn Valdez MRN: [id number] Date of Birth: 1940/12/19  Transition of Care [facility]) CM/SW Contact  Sharlet Salina Mila Homer, LCSW Phone Number: 10/01/2019, 11:23 AM  Clinical Narrative: CSW received a secure chat from attending-Dr. Lonny Prude that she got a call from [facility] that the appeal went through and patient is approved for SNF - [id number]; Clinical nurse Barrie Dunker - [phone].  Consulted with dialysis coordinator Terri Piedra and patient can still go to dialysis in [place]. Call made to [place], admissions director at [facility] regarding patient's insurance authorization and was advised that they can accept patient. Provided Debbie with HD schedule: [facility] - MWF - 11:00 am chair time with an arrival time of 11 am. MD updated re: facility placement and asked to order another COVID test. Varney Biles at [facility] and updated her on patient's placement.   11:15 am: Talked with Varney Biles, RN at [facility] [phone]) and informed her that insurance did approve patient for rehab and her d/c to [facility]. Varney Biles reported that patient has a history of refusing therapy.    3:38 pm: Visited with patient and updated her on insurance approval this received this morning and going to [facility] in [place]. Also informed her that she will get dialysis at [facility]. Patient replied that she thought she was returning to [place], and CSW explained that contact was made with RN Varney Biles and they are aware of her going to SNF for ST rehab. Received call from [place], admissions director at [facility] (2:48 pm) and informed her that [place] has not resulted yet.      Expected Discharge Plan: Skilled Nursing Facility Barriers to Discharge: Other (comment)(COVID positive)  Expected Discharge Plan and Services Expected Discharge Plan: [place] In-house Referral: Clinical Social Work    Post Acute Care Choice: Nursing Home Living arrangements for the past 2 months: [place]                                     Social Determinants of Health (SDOH) Interventions    Readmission Risk Interventions Readmission Risk Prevention Plan 07/04/2019 03/19/2019  Transportation Screening Complete Complete  Medication Review Press photographer) Complete Complete  PCP or Specialist appointment within 3-5 days of discharge Complete Complete  HRI or [place] Complete Complete  SW Recovery Care/Counseling Consult Complete Not Complete  SW Consult Not Complete Comments - na  Palliative Care Screening Not Applicable Not [place] Patient Refused Not Applicable  Some recent data might be hidden

## 2019-10-01 NOTE — Progress Notes (Signed)
[facility] NEPHROLOGY PROGRESS NOTE  Assessment/ Plan: Pt is a 78 y.o. yo female ESRD on HD at [facility] admitted with COVID-19 infection and NSTEMI.  #COVID-19 infection: Respiratory status is improving.  Her course is complicated by non-STEMI.  # ESRD MWF.  Status post HD today 2500 cc UF.  Tolerated well.  [facility] for the access.  # Anemia of chronic disease: Check iron level.  Start Aranesp weekly.  # Secondary hyperparathyroidism: Calcium and phosphorus level acceptable.  Continue PhosLo.  # HTN/volume: Monitor blood pressure.  UF during HD.  #Hyponatremia, hypervolemic: Monitor on HD.  Minimize fluid intake.  #E. coli UTI: On antibiotics per primary team.  Subjective: Seen and examined at bedside.  Had HD today.  Complains chronic weakness.  Denied nausea, vomiting, chest pain, shortness of breath. Objective Vital signs in last 24 hours: Vitals:   10/01/19 1130 10/01/19 1200 10/01/19 1229 10/01/19 1320  BP: (!) 121/38 (!) 122/51 (!) 131/50 (!) 149/50  Pulse: 60 68 68 74  Resp:   20 18  Temp:   98 F (36.7 C) 98.2 F (36.8 C)  TempSrc:   Oral Oral  SpO2:   97% 97%  Weight:   123.7 kg   Height:       Weight change: 0.05 kg  Intake/Output Summary (Last 24 hours) at 10/01/2019 1353 Last data filed at 10/01/2019 1229 Gross per 24 hour  Intake 120 ml  Output 2550 ml  Net -2430 ml       Labs: Basic Metabolic Panel: Recent Labs  Lab 09/26/19 0728 09/28/19 0719 10/01/19 0844  NA 128* 129* 129*  K 4.1 3.8 4.3  CL 90* 94* 93*  CO2 27 26 26   GLUCOSE 212* 218* 295*  BUN 36* 23 28*  CREATININE 4.38* 4.17* 4.72*  CALCIUM 8.6* 9.0 9.2  PHOS 3.0 3.8 3.4   Liver Function Tests: Recent Labs  Lab 09/26/19 0728 09/28/19 0719 10/01/19 0844  ALBUMIN 2.6* 2.4* 2.5*   No results for input(s): LIPASE, AMYLASE in the last 168 hours. No results for input(s): AMMONIA in the last 168 hours. CBC: Recent Labs  Lab 09/26/19 0728 09/28/19 0719  10/01/19 0603  WBC 7.1 5.9 9.1  HGB 8.5* 8.7* 8.5*  HCT 27.7* 28.4* 26.5*  MCV 94.2 94.0 90.8  PLT 199 225 246   Cardiac Enzymes: No results for input(s): CKTOTAL, CKMB, CKMBINDEX, TROPONINI in the last 168 hours. CBG: Recent Labs  Lab 09/30/19 2003 10/01/19 0007 10/01/19 0429 10/01/19 0733 10/01/19 1321  GLUCAP 215* 271* 229* 268* 198*    Iron Studies: No results for input(s): IRON, TIBC, TRANSFERRIN, FERRITIN in the last 72 hours. Studies/Results: No results found.  Medications: Infusions:   Scheduled Medications: . atorvastatin  40 mg Oral q1800  . calcium acetate  1,334 mg Oral TID AC  . cefdinir  300 mg Oral Q48H  . Chlorhexidine Gluconate Cloth  6 each Topical Q0600  . citalopram  10 mg Oral Daily  . docusate sodium  100 mg Oral BID  . feeding supplement (GLUCERNA SHAKE)  237 mL Oral TID BM  . heparin      . insulin aspart  0-15 Units Subcutaneous TID AC & HS  . insulin aspart  10 Units Subcutaneous TID WC  . insulin glargine  35 Units Subcutaneous QHS  . lamoTRIgine  25 mg Oral Daily  . levETIRAcetam  250 mg Oral BID  . mouth rinse  15 mL Mouth Rinse BID  . metoprolol tartrate  75 mg Oral BID  . mirabegron ER  25 mg Oral Daily  . OLANZapine  5 mg Oral QHS  . pantoprazole  40 mg Oral Daily  . senna  1 tablet Oral BID  . sodium chloride flush  3 mL Intravenous Q12H  . torsemide  40 mg Oral Once per day on Sun Tue Thu Sat  . vitamin C  500 mg Oral Daily  . warfarin  6 mg Oral ONCE-1800  . Warfarin - Pharmacist Dosing Inpatient   Does not apply q1800  . zinc sulfate  220 mg Oral Daily    have reviewed scheduled and prn medications.  Physical Exam: General:NAD, comfortable Heart:RRR, s1s2 nl Lungs:clear b/l, no crackle Abdomen:soft, Non-tender, non-distended Extremities: Trace LE edema Dialysis Access: Right IJ TDC   Prasad  10/01/2019,1:53 PM  LOS: 20 days  Pager: [phone]

## 2019-10-01 NOTE — Progress Notes (Signed)
Renal Navigator contacted [facility]/OP HD clinic to see if she can still have TTS 10:00am chair time as CSW/V. Sharlet Salina has informed Navigator that [facility] [employer] has come back to approve SNF placement after initially denying.  Navigator has confirmed that patient still has seat in [place]. Plan for discharge once COVID test results. Navigator spoke with Nephrologist who agrees with changing schedule from MWF to TTS on an OP basis. This week schedule as follows: M, Th, S. She had HD inpt today. Plan for discharge tomorrow-discussed with CSW/V. [place].  Alphonzo Cruise, [place] Renal Navigator [phone]

## 2019-10-01 NOTE — Progress Notes (Signed)
Patient is now on air mattress. Patient tolerated well. Changed foam dressing on sacrum.   Farley Ly RN

## 2019-10-01 NOTE — Progress Notes (Signed)
Late Entry and update to plan for patient: In order to keep with [place] clinic, patient changed from [facility] clinic to [place] clinic. Patient cannot be accommodated as a transient at [place] clinic at this time.  Patient has been accepted as a transient at [facility] with a chair time of 10:00am TTS. She needs to arrive to appointments between 9:30-9:40am. CSW updated. Further update: Per CSW, patient's insurance [name] was denied and then upheld after peer to peer. Navigator updated [facility].  Alphonzo Cruise, [place] Renal Navigator [phone]

## 2019-10-02 DIAGNOSIS — B9620 Unspecified Escherichia coli [E. coli] as the cause of diseases classified elsewhere: Secondary | ICD-10-CM

## 2019-10-02 LAB — PROTIME-INR
INR: 2.4 — ABNORMAL HIGH (ref 0.8–1.2)
Prothrombin Time: 25.9 seconds — ABNORMAL HIGH (ref 11.4–15.2)

## 2019-10-02 LAB — GLUCOSE, CAPILLARY
Glucose-Capillary: 136 mg/dL — ABNORMAL HIGH (ref 70–99)
Glucose-Capillary: 143 mg/dL — ABNORMAL HIGH (ref 70–99)
Glucose-Capillary: 181 mg/dL — ABNORMAL HIGH (ref 70–99)
Glucose-Capillary: 153 mg/dL — ABNORMAL HIGH (ref 70–99)
Glucose-Capillary: 190 mg/dL — ABNORMAL HIGH (ref 70–99)

## 2019-10-02 LAB — IRON AND TIBC
Iron: 55 ug/dL (ref 28–170)
Saturation Ratios: 20 % (ref 10.4–31.8)
TIBC: 273 ug/dL (ref 250–450)
UIBC: 218 ug/dL

## 2019-10-02 LAB — FERRITIN: Ferritin: 188 ng/mL (ref 11–307)

## 2019-10-02 MED ORDER — WARFARIN SODIUM 6 MG PO TABS
6.0000 mg | ORAL_TABLET | Freq: Once | ORAL | Status: AC
  Administered 2019-10-02: 6 mg via ORAL
  Filled 2019-10-02: qty 1

## 2019-10-02 MED ORDER — POLYETHYLENE GLYCOL 3350 17 G PO PACK
17.0000 g | PACK | Freq: Every day | ORAL | Status: DC
  Administered 2019-10-02: 17 g via ORAL
  Filled 2019-10-02: qty 1

## 2019-10-02 MED ORDER — GLUCERNA SHAKE PO LIQD: 237.0000 mL | Freq: Three times a day (TID) | ORAL | 0 refills | Status: AC

## 2019-10-02 MED ORDER — WARFARIN SODIUM 6 MG PO TABS: 6.0000 mg | ORAL_TABLET | Freq: Every day | ORAL | Status: AC

## 2019-10-02 MED ORDER — INSULIN GLARGINE 100 UNIT/ML ~~LOC~~ SOLN: 40.0000 [IU] | Freq: Every day | SUBCUTANEOUS | 0 refills | Status: AC

## 2019-10-02 MED ORDER — SODIUM CHLORIDE 0.9 % IV SOLN
510.0000 mg | Freq: Once | INTRAVENOUS | Status: AC
  Administered 2019-10-02: 510 mg via INTRAVENOUS
  Filled 2019-10-02: qty 17

## 2019-10-02 NOTE — Progress Notes (Signed)
ANTICOAGULATION CONSULT NOTE - Follow Up Consult  Pharmacy Consult for warfarin Indication: atrial fibrillation  Allergies  Allergen Reactions  . Sulfa Antibiotics Hives  . Biaxin [Clarithromycin] Hives  . Buspar [Buspirone]   . Pyridium [Phenazopyridine Hcl] Other (See Comments)    Reaction:  Unknown   . Tuberculin Tests   . Ceftriaxone Anxiety  . Influenza A (H1n1) Monoval Vac Other (See Comments)    Pt states that she was told by her MD not to get the influenza vaccine.    . Latex Rash  . Morphine Other (See Comments)    Reaction:  Dizziness and confusion   . Prednisone Rash  . Tape Rash    Patient Measurements: Height: 5\' 7"  (170.2 cm) Weight: 272 lb 11.3 oz (123.7 kg) IBW/kg (Calculated) : 61.6 Heparin Dosing Weight: 91 kg  Vital Signs: Temp: 98.6 F (37 C) (12/15 0820) Temp Source: Oral (12/15 0820) BP: 127/52 (12/15 0820) Pulse Rate: 61 (12/15 0412)  Labs: Recent Labs    09/30/19 0503 10/01/19 0603 10/01/19 0844 10/02/19 0530  HGB  --  8.5*  --   --   HCT  --  26.5*  --   --   PLT  --  246  --   --   LABPROT 29.2* 28.3*  --  25.9*  INR 2.8* 2.7*  --  2.4*  CREATININE  --   --  4.72*  --     Estimated Creatinine Clearance: 13.8 mL/min (A) (by C-G formula based on SCr of 4.72 mg/dL (H)).   Assessment: 78 yo female transferred from [facility] with atrial fibrillation and NSTEMI. No anticoagulation PTA. CHADS2VASC 5. Of note, she was COVID + at her ALF 11/4. Patient also noted hx GIB in 07/2018. Plan is to follow up with cardiology on cardioversion after 4 weeks of therapeutic warfarin.   INR today remains therapeutic (INR 2.5, goal of 2-3). CBC stable - no bleeding noted.   Goal of Therapy:  INR 2-3 Monitor platelets by anticoagulation protocol: Yes   Plan:  - Repeat Warfarin 6 mg x 1 dose at 1800 today - Daily PT/INR, CBC q72h - Will continue to monitor for any signs/symptoms of bleeding and will follow up with PT/INR in the a.m.    Thank you for  allowing pharmacy to be a part of this patient's care.  [employer], Pharm.D., BCPS Clinical Pharmacist Clinical phone for 10/02/2019 from 8:30-4:00 is [phone].  **Pharmacist phone directory can be found on [email or web address] listed under [place].  10/02/2019 1:19 PM

## 2019-10-02 NOTE — Discharge Summary (Signed)
Carolyn Valdez QJJ:[id number] DOB: May 01, 1941 DOA: 09/11/2019  PCP: System, Provider Not In  Admit date: 09/11/2019 Discharge date: 10/02/2019  [facility] From[name] Disposition: SNF  Recommendations for Outpatient Follow-up:  1. Follow up with PCP in 1-2 weeks.  Outpatient follow-up with primary cardiologist (Dr. Velva Harman) in 4 weeks for cardioversion regarding A. fib, and consideration of outpatient ischemic evaluation 2. Please obtain BMP(Na) in one week 3. New medications: Coumadin 6 mg (x1 6 PM on 12/18), will need INR check in 1 to 2 days to determine titration, lantus 40 U 4. Discontinued medications: Losartan, hydralazine 5. Please follow up on the following pending results:  Home Health: No Equipment/Devices: 3 l cannula oxygen  Discharge Condition: Stable CODE STATUS: DNR Diet recommendation: Renal  Brief/Interim Summary: History of present illness:  Carolyn Valdez is a 78 y.o. year old female with medical history significant for chronic HFpEF,nAfib/flutter, severe pulmonary hypertension, GI bleed (07/2018), hypertension, hyperlipidemia, diabetes mellitus, obesity, and ESRD on hemodialysis who presented to [facility] on 09/11/2019 with dyspnea, fatigue and palpitations not tolerating HD and was found to have atrial flutter with RVR, hypotension and  elevated troponin (2236) with global ST depressions initially admitted with working diagnosis of NSTEMI after evaluation by cardiology ( Dr. Fletcher Anon and Dr. Saunders Revel).    Patient was started on IV heparin and beta-blockade with initial plan for ischemic evaluation pending her Covid test; however her Covid test returned positive.  Quick downtrending of her initial troponin from 2030 36-2021 and 2 hours in setting respiratory stress related to COVID-19 infection seem more consistent with supply demand mismatch or subacute ACS per cardiology and they did not recommend urgent cardiac catheterization during this hospital stay.  Patient was transferred  to [facility] on 11/24 given ESRD requiring hemodialysis and COVID-19 positive status.  Hospital course complicated by atrial flutter with RVR and elevated troponin presumed to be demand ischemia in the setting of concurrent COVID-19 infection, unable to get TEE/DCCV in setting of Covid positive infection per cardiology, acute on chronic CHF preserved EF exacerbation, Covid pneumonia requiring remdesivir therapy for 5 days and Decadron for 10 days  Remaining hospital course addressed in problem based format below:   Hospital Course:   E.coli UTI.    Dysuria resolved, completed 5-day course (IV ceftriaxone and cefdinir) in hospital.  Atrial flutter, rate controlled.  CHA2DS2-VASc score of 5.               -Continue metoprolol 75 mg twice daily  -Continue Coumadin, plan for 6 mg x 1 dose on 12/15 evening, monitor INR to determine titration, repeat in next 1 to 2 days    -Follow-up with Dr. Fletcher Anon in [place] in 4 weeks, to discuss cardioversion as outpatient after recent hospitalization/Covid infection/therapeutic INR  Elevated troponin, felt to be demand ischemia in setting Covid positive, a flutter with RVR, and CHF exacerbation per cardiology.  Initially admitted with working dx of NSTEMI. TTE showed normal LV function   -Cardiology recommends further nonurgent ischemic evaluation as outpatient (Dr. Fletcher Anon) after full recovery from current hospitalization.  -Continue aspirin, statin, beta-blocker  Covid 19 pneumonia, resolved.  Previously diagnosed 11/4 at her ALF without need for hospitalization.  Completed remdesivir 5-day course and Decadron 10-day course.  Asymptomatic, no airborne/contact precautions given greater than 21 days since onset of positive test and improvement in symptoms per ID (12/4, Dr. Graylon Good) and CDC/cone guidelines  -negative Covid test on 12/8, 12/10, 12/14--prior to discharge to SNF  -Stable on home  3 L  ESRD on HD, MWF   -Tolerating HD in hospital, avoid  nephrotoxins, monitor out              -continue phoslo  Chronic diastolic CHF., stable. euvolemic on my exam.               -Volume management with HD and torsemide on nondialysis days due to intradialytic weight gain               Mild hypervolemic hyponatremia, stable. Ranging 129-130.  129 on discharge.  Likely hypervolemic hyponatremia in setting of ESRD              -Fluid restrict, managed with HD  -Recommend BMP in 1 week  Anemia of CKD, hemoglobin stable at baseline   Hypertension, at goal. Hypotension transiently on admission.  Held home hydralazine and losartan throughout hospital stay              -continue metoprolol              - hold home hydralazine and losartan  Type 2 diabetes. A1c 7.9 held home glipizide throughout hospitalization              -Resume glipizide discharge to SNF              -Resume home regimen of Lantus 40 units, and sliding scale as needed, titrate up as needed  History of seizure disorder, stable.              -Continue Keppra  Physical deconditioning.              -PT recommends skilled nursing facility for short-term rehab              --However patient is currently at her prior level of independence; Pt agrees,               --SNF denied,               -Education officer, museum working on returning to her ALF with home health therapy if possible  Mood disorder, stable.              -Continue Celexa, Zyprexa, Lamictal  Chronic hypoxic respiratory failure.  Patient stable on home O2 requirements of 3 L.  Likely multifactorial related to chronic diastolic heart failure, pulmonary arterial hypertension. -Continue O2  GERD, stable -Continue PPI  Constipation, improving.  Last bowel movement 12/14 prior to discharge -continue senna docusate, daily MiraLAX     Consultations:  Cardiology, nephrology  Procedures/Studies: TTE, 11/25 Subjective: Anxious about going to skilled nursing facility.  Cannot rule out/had a BM (per chart  review 12/14).  Has some mild lower belly pain, no nausea, no vomiting, no chest pain, no shortness of breath Discharge Exam: Vitals:   10/02/19 0412 10/02/19 0820  BP:  (!) 127/52  Pulse: 61   Resp: 12 16  Temp: 98 F (36.7 C) 98.6 F (37 C)  SpO2: 98% 94%   Vitals:   10/01/19 1600 10/01/19 2011 10/02/19 0412 10/02/19 0820  BP: (!) 129/43 (!) 110/43  (!) 127/52  Pulse: (!) 59 (!) 58 61   Resp: 20 20 12 16   Temp: 99 F (37.2 C) 98.9 F (37.2 C) 98 F (36.7 C) 98.6 F (37 C)  TempSrc: Oral Oral Axillary Oral  SpO2: 99% 98% 98% 94%  Weight:      Height:        General: Obese elderly female, lying  in bed in no acute distress Eyes: EOMI, anicteric ENT: Oral Mucosa clear and moist Cardiovascular: regular rate and rhythm, no murmurs, rubs or gallops, mild nonpitting edema of bilateral lower ankles (unchanged for several days) Respiratory: Normal respiratory effort on 3 L nasal cannula, lungs clear to auscultation bilaterally Abdomen: soft, non-distended, slightly tender with the location of lower abdomen, no rebound tenderness, no guarding Skin: No Rash Neurologic: Grossly no focal neuro deficit.Mental status AAOx3, speech normal, Psychiatric:Appropriate affect, and mood  Discharge Diagnoses:  Active Problems:   Diabetes mellitus, type 2 (HCC)   Acute lower UTI   E. coli UTI   Adjustment disorder with anxiety   Anemia associated with chronic renal failure   Chronic diastolic CHF (congestive heart failure) (HCC)   ESRD on dialysis [facility])   Atrial flutter (HCC)   Respiratory tract infection due to COVID-19 virus   Hyponatremia    Discharge Instructions  Discharge Instructions    Diet - low sodium heart healthy   Complete by: As directed    Increase activity slowly   Complete by: As directed      Allergies as of 10/02/2019      Reactions   Sulfa Antibiotics Hives   Biaxin [clarithromycin] Hives   Buspar [buspirone]    Pyridium [phenazopyridine Hcl] Other (See  Comments)   Reaction:  Unknown    Tuberculin Tests    Ceftriaxone Anxiety   Influenza A (h1n1) Monoval Vac Other (See Comments)   Pt states that she was told by her MD not to get the influenza vaccine.     Latex Rash   Morphine Other (See Comments)   Reaction:  Dizziness and confusion    Prednisone Rash   Tape Rash      Medication List    STOP taking these medications   amoxicillin 500 MG capsule Commonly known as: AMOXIL   docusate sodium 100 MG capsule Commonly known as: COLACE   heparin 25000-0.45 UT/250ML-% infusion   hydrALAZINE 25 MG tablet Commonly known as: APRESOLINE   losartan 50 MG tablet Commonly known as: COZAAR   oxyCODONE-acetaminophen 5-325 MG tablet Commonly known as: PERCOCET/ROXICET     TAKE these medications   acetaminophen 325 MG tablet Commonly known as: TYLENOL Take 2 tablets (650 mg total) by mouth every 6 (six) hours as needed for mild pain (or Fever >/= 101).   albuterol 108 (90 Base) MCG/ACT inhaler Commonly known as: VENTOLIN HFA Inhale 2 puffs into the lungs every 6 (six) hours as needed for wheezing or shortness of breath.   aspirin 81 MG chewable tablet Chew 1 tablet (81 mg total) by mouth daily.   atorvastatin 40 MG tablet Commonly known as: LIPITOR Take 1 tablet (40 mg total) by mouth daily at 6 PM. What changed: when to take this   Auryxia 1 GM 210 MG(Fe) tablet Generic drug: ferric citrate Take 420 mg by mouth 3 (three) times daily with meals.   calcium acetate 667 MG capsule Commonly known as: PHOSLO Take 1,334 mg by mouth 3 (three) times daily before meals.   citalopram 10 MG tablet Commonly known as: CELEXA Take 1 tablet (10 mg total) by mouth daily.   clonazePAM 0.25 MG disintegrating tablet Commonly known as: KLONOPIN Take 1 tablet (0.25 mg total) by mouth every 12 (twelve) hours as needed (anxiety).   feeding supplement (GLUCERNA SHAKE) Liqd Take 237 mLs by mouth 3 (three) times daily between meals.    fluticasone 50 MCG/ACT nasal spray Commonly known as: FLONASE  Place 1 spray into both nostrils daily.   glipiZIDE 5 MG tablet Commonly known as: GLUCOTROL Take 5 mg by mouth daily before breakfast.   insulin aspart 100 UNIT/ML injection Commonly known as: novoLOG 3 (three) times daily before meals. Inject per sliding scale 0-199=0, 200-500=5 units, notify MD for blood sugar> or equal to 500   insulin glargine 100 UNIT/ML injection Commonly known as: LANTUS Inject 0.4 mLs (40 Units total) into the skin daily. What changed:   how much to take  when to take this   iron polysaccharides 150 MG capsule Commonly known as: NIFEREX Take 150 mg by mouth daily.   lamoTRIgine 25 MG tablet Commonly known as: LAMICTAL Take 1 tablet (25 mg total) by mouth daily.   levETIRAcetam 250 MG tablet Commonly known as: KEPPRA Take 1 tablet (250 mg total) by mouth 2 (two) times daily.   loratadine 10 MG tablet Commonly known as: CLARITIN Take 10 mg by mouth daily.   Melatonin 3 MG Tabs Take 6 mg by mouth at bedtime.   Men-Phor lotion Generic drug: camphor-menthol Apply 1 application topically every 6 (six) hours as needed for itching.   metoprolol tartrate 50 MG tablet Commonly known as: LOPRESSOR Take 50 mg by mouth 2 (two) times daily.   multivitamin tablet Take 1 tablet by mouth daily.   Myrbetriq 25 MG Tb24 tablet Generic drug: mirabegron ER Take 25 mg by mouth daily.   nystatin powder Generic drug: nystatin Apply 1 g topically See admin instructions. Apply to under breast and abdominal once daily & apply every 12 hours as needed for abdominal folds, groin, and under breast   OLANZapine 5 MG tablet Commonly known as: ZYPREXA Take 5 mg by mouth at bedtime.   ondansetron 8 MG disintegrating tablet Commonly known as: ZOFRAN-ODT Take 8 mg by mouth every 8 (eight) hours as needed for nausea/vomiting.   pantoprazole 40 MG tablet Commonly known as: PROTONIX Take 40 mg by  mouth daily.   polyethylene glycol 17 g packet Commonly known as: MIRALAX / GLYCOLAX Take 17 g by mouth daily. What changed:   when to take this  reasons to take this   saccharomyces boulardii 250 MG capsule Commonly known as: FLORASTOR Take 250 mg by mouth daily.   sennosides-docusate sodium 8.6-50 MG tablet Commonly known as: SENOKOT-S Take 1 tablet by mouth at bedtime.   tiZANidine 2 MG tablet Commonly known as: ZANAFLEX Take 2 mg by mouth every 8 (eight) hours as needed for muscle spasms.   torsemide 20 MG tablet Commonly known as: DEMADEX Take 2 tablets (40 mg total) by mouth daily. What changed: how much to take   Uribel 118 MG Caps Take 118 mg by mouth every 6 (six) hours as needed (uti symptoms).   vitamin B-12 1000 MCG tablet Commonly known as: CYANOCOBALAMIN Take 1,000 mcg by mouth daily.   warfarin 6 MG tablet Commonly known as: COUMADIN Take 1 tablet (6 mg total) by mouth daily.       Allergies  Allergen Reactions  . Sulfa Antibiotics Hives  . Biaxin [Clarithromycin] Hives  . Buspar [Buspirone]   . Pyridium [Phenazopyridine Hcl] Other (See Comments)    Reaction:  Unknown   . Tuberculin Tests   . Ceftriaxone Anxiety  . Influenza A (H1n1) Monoval Vac Other (See Comments)    Pt states that she was told by her MD not to get the influenza vaccine.    . Latex Rash  . Morphine Other (See Comments)  Reaction:  Dizziness and confusion   . Prednisone Rash  . Tape Rash        The results of significant diagnostics from this hospitalization (including imaging, microbiology, ancillary and laboratory) are listed below for reference.     Microbiology: Recent Results (from the past 240 hour(s))  SARS CORONAVIRUS 2 (TAT 6-24 HRS) Nasopharyngeal Nasopharyngeal Swab     Status: None   Collection Time: 09/25/19  5:18 PM   Specimen: Nasopharyngeal Swab  Result Value Ref Range Status   SARS Coronavirus 2 NEGATIVE NEGATIVE Final    Comment:  (NOTE) SARS-CoV-2 target nucleic acids are NOT DETECTED. The SARS-CoV-2 RNA is generally detectable in upper and lower respiratory specimens during the acute phase of infection. Negative results do not preclude SARS-CoV-2 infection, do not rule out co-infections with other pathogens, and should not be used as the sole basis for treatment or other patient management decisions. Negative results must be combined with clinical observations, patient history, and epidemiological information. The expected result is Negative. Fact Sheet for Patients: SugarRoll.be Fact Sheet for Healthcare Providers: https://www.woods-mathews.com/ This test is not yet approved or cleared by the [place] FDA and  has been authorized for detection and/or diagnosis of SARS-CoV-2 by FDA under an Emergency Use Authorization (EUA). This EUA will remain  in effect (meaning this test can be used) for the duration of the COVID-19 declaration under Section 56 4(b)(1) of the Act, 21 U.S.C. section 360bbb-3(b)(1), unless the authorization is terminated or revoked sooner. Performed at [facility], [place] [address] [place], [place] [address]   Culture, Urine     Status: Abnormal   Collection Time: 09/26/19  2:51 PM   Specimen: Urine, Clean Catch  Result Value Ref Range Status   Specimen Description URINE, CLEAN CATCH  Final   Special Requests   Final    Normal Performed at [facility], [place] [address] [place], [place] [address]    Culture (A)  Final    60,000 COLONIES/mL ESCHERICHIA COLI 60,000 COLONIES/mL YEAST    Report Status 09/29/2019 FINAL  Final   Organism ID, Bacteria ESCHERICHIA COLI (A)  Final      Susceptibility   Escherichia coli - MIC*    AMPICILLIN >=32 RESISTANT Resistant     CEFAZOLIN >=64 RESISTANT Resistant     CEFTRIAXONE <=1 SENSITIVE Sensitive     CIPROFLOXACIN >=4 RESISTANT Resistant     GENTAMICIN <=1 SENSITIVE Sensitive      IMIPENEM <=0.25 SENSITIVE Sensitive     NITROFURANTOIN <=16 SENSITIVE Sensitive     TRIMETH/SULFA <=20 SENSITIVE Sensitive     AMPICILLIN/SULBACTAM >=32 RESISTANT Resistant     PIP/TAZO >=128 RESISTANT Resistant     * 60,000 COLONIES/mL ESCHERICHIA COLI  SARS CORONAVIRUS 2 (TAT 6-24 HRS) Nasopharyngeal Nasopharyngeal Swab     Status: None   Collection Time: 09/27/19  1:35 PM   Specimen: Nasopharyngeal Swab  Result Value Ref Range Status   SARS Coronavirus 2 NEGATIVE NEGATIVE Final    Comment: (NOTE) SARS-CoV-2 target nucleic acids are NOT DETECTED. The SARS-CoV-2 RNA is generally detectable in upper and lower respiratory specimens during the acute phase of infection. Negative results do not preclude SARS-CoV-2 infection, do not rule out co-infections with other pathogens, and should not be used as the sole basis for treatment or other patient management decisions. Negative results must be combined with clinical observations, patient history, and epidemiological information. The expected result is Negative. Fact Sheet for Patients: SugarRoll.be Fact Sheet for  Healthcare Providers: https://www.woods-mathews.com/ This test is not yet approved or cleared by the [place] and  has been authorized for detection and/or diagnosis of SARS-CoV-2 by FDA under an Emergency Use Authorization (EUA). This EUA will remain  in effect (meaning this test can be used) for the duration of the COVID-19 declaration under Section 56 4(b)(1) of the Act, 21 U.S.C. section 360bbb-3(b)(1), unless the authorization is terminated or revoked sooner. Performed at [facility], [place] [address] [place], [place] [address]   SARS CORONAVIRUS 2 (TAT 6-24 HRS) Nasopharyngeal Nasopharyngeal Swab     Status: None   Collection Time: 10/01/19  3:01 PM   Specimen: Nasopharyngeal Swab  Result Value Ref Range Status   SARS Coronavirus 2 NEGATIVE NEGATIVE Final     Comment: (NOTE) SARS-CoV-2 target nucleic acids are NOT DETECTED. The SARS-CoV-2 RNA is generally detectable in upper and lower respiratory specimens during the acute phase of infection. Negative results do not preclude SARS-CoV-2 infection, do not rule out co-infections with other pathogens, and should not be used as the sole basis for treatment or other patient management decisions. Negative results must be combined with clinical observations, patient history, and epidemiological information. The expected result is Negative. Fact Sheet for Patients: SugarRoll.be Fact Sheet for Healthcare Providers: https://www.woods-mathews.com/ This test is not yet approved or cleared by the [place] FDA and  has been authorized for detection and/or diagnosis of SARS-CoV-2 by FDA under an Emergency Use Authorization (EUA). This EUA will remain  in effect (meaning this test can be used) for the duration of the COVID-19 declaration under Section 56 4(b)(1) of the Act, 21 U.S.C. section 360bbb-3(b)(1), unless the authorization is terminated or revoked sooner. Performed at [facility], [place] [address], [place], [place] [address]      Labs: BNP (last 3 results) Recent Labs    03/02/19 0530 07/02/19 1734 09/10/19 1329  BNP 867.0* 97.0 580.9*   Basic Metabolic Panel: Recent Labs  Lab 09/26/19 0728 09/28/19 0719 10/01/19 0844  NA 128* 129* 129*  K 4.1 3.8 4.3  CL 90* 94* 93*  CO2 27 26 26   GLUCOSE 212* 218* 295*  BUN 36* 23 28*  CREATININE 4.38* 4.17* 4.72*  CALCIUM 8.6* 9.0 9.2  PHOS 3.0 3.8 3.4   Liver Function Tests: Recent Labs  Lab 09/26/19 0728 09/28/19 0719 10/01/19 0844  ALBUMIN 2.6* 2.4* 2.5*   No results for input(s): LIPASE, AMYLASE in the last 168 hours. No results for input(s): AMMONIA in the last 168 hours. CBC: Recent Labs  Lab 09/26/19 0728 09/28/19 0719 10/01/19 0603  WBC 7.1 5.9 9.1  HGB 8.5* 8.7* 8.5*   HCT 27.7* 28.4* 26.5*  MCV 94.2 94.0 90.8  PLT 199 225 246   Cardiac Enzymes: No results for input(s): CKTOTAL, CKMB, CKMBINDEX, TROPONINI in the last 168 hours. BNP: Invalid input(s): POCBNP CBG: Recent Labs  Lab 10/01/19 2007 10/02/19 0124 10/02/19 0407 10/02/19 0804 10/02/19 1135  GLUCAP 145* 136* 143* 181* 153*   D-Dimer No results for input(s): DDIMER in the last 72 hours. Hgb A1c No results for input(s): HGBA1C in the last 72 hours. Lipid Profile No results for input(s): CHOL, HDL, LDLCALC, TRIG, CHOLHDL, LDLDIRECT in the last 72 hours. Thyroid function studies No results for input(s): TSH, T4TOTAL, T3FREE, THYROIDAB in the last 72 hours.  Invalid input(s): FREET3 Anemia work up Recent Labs    10/02/19 0530  FERRITIN 188  TIBC 273  IRON 55   Urinalysis  Component Value Date/Time   COLORURINE AMBER (A) 09/26/2019 1455   APPEARANCEUR CLOUDY (A) 09/26/2019 1455   APPEARANCEUR Cloudy (A) 01/16/2018 1349   LABSPEC 1.016 09/26/2019 1455   LABSPEC 1.008 09/08/2014 1719   PHURINE 5.0 09/26/2019 1455   GLUCOSEU NEGATIVE 09/26/2019 1455   GLUCOSEU Negative 09/08/2014 1719   HGBUR NEGATIVE 09/26/2019 1455   BILIRUBINUR NEGATIVE 09/26/2019 1455   BILIRUBINUR Negative 01/16/2018 1349   BILIRUBINUR Negative 09/08/2014 1719   KETONESUR NEGATIVE 09/26/2019 1455   PROTEINUR 100 (A) 09/26/2019 1455   NITRITE NEGATIVE 09/26/2019 1455   LEUKOCYTESUR LARGE (A) 09/26/2019 1455   LEUKOCYTESUR 3+ 09/08/2014 1719   Sepsis Labs Invalid input(s): PROCALCITONIN,  WBC,  LACTICIDVEN Microbiology Recent Results (from the past 240 hour(s))  SARS CORONAVIRUS 2 (TAT 6-24 HRS) Nasopharyngeal Nasopharyngeal Swab     Status: None   Collection Time: 09/25/19  5:18 PM   Specimen: Nasopharyngeal Swab  Result Value Ref Range Status   SARS Coronavirus 2 NEGATIVE NEGATIVE Final    Comment: (NOTE) SARS-CoV-2 target nucleic acids are NOT DETECTED. The SARS-CoV-2 RNA is generally  detectable in upper and lower respiratory specimens during the acute phase of infection. Negative results do not preclude SARS-CoV-2 infection, do not rule out co-infections with other pathogens, and should not be used as the sole basis for treatment or other patient management decisions. Negative results must be combined with clinical observations, patient history, and epidemiological information. The expected result is Negative. Fact Sheet for Patients: SugarRoll.be Fact Sheet for Healthcare Providers: https://www.woods-mathews.com/ This test is not yet approved or cleared by the [place] FDA and  has been authorized for detection and/or diagnosis of SARS-CoV-2 by FDA under an Emergency Use Authorization (EUA). This EUA will remain  in effect (meaning this test can be used) for the duration of the COVID-19 declaration under Section 56 4(b)(1) of the Act, 21 U.S.C. section 360bbb-3(b)(1), unless the authorization is terminated or revoked sooner. Performed at [facility], [place] [address] [place], [place] [address]   Culture, Urine     Status: Abnormal   Collection Time: 09/26/19  2:51 PM   Specimen: Urine, Clean Catch  Result Value Ref Range Status   Specimen Description URINE, CLEAN CATCH  Final   Special Requests   Final    Normal Performed at [facility], [place] [address] [place], [place] [address]    Culture (A)  Final    60,000 COLONIES/mL ESCHERICHIA COLI 60,000 COLONIES/mL YEAST    Report Status 09/29/2019 FINAL  Final   Organism ID, Bacteria ESCHERICHIA COLI (A)  Final      Susceptibility   Escherichia coli - MIC*    AMPICILLIN >=32 RESISTANT Resistant     CEFAZOLIN >=64 RESISTANT Resistant     CEFTRIAXONE <=1 SENSITIVE Sensitive     CIPROFLOXACIN >=4 RESISTANT Resistant     GENTAMICIN <=1 SENSITIVE Sensitive     IMIPENEM <=0.25 SENSITIVE Sensitive     NITROFURANTOIN <=16 SENSITIVE Sensitive      TRIMETH/SULFA <=20 SENSITIVE Sensitive     AMPICILLIN/SULBACTAM >=32 RESISTANT Resistant     PIP/TAZO >=128 RESISTANT Resistant     * 60,000 COLONIES/mL ESCHERICHIA COLI  SARS CORONAVIRUS 2 (TAT 6-24 HRS) Nasopharyngeal Nasopharyngeal Swab     Status: None   Collection Time: 09/27/19  1:35 PM   Specimen: Nasopharyngeal Swab  Result Value Ref Range Status   SARS Coronavirus 2 NEGATIVE NEGATIVE Final    Comment: (NOTE) SARS-CoV-2 target nucleic acids are  NOT DETECTED. The SARS-CoV-2 RNA is generally detectable in upper and lower respiratory specimens during the acute phase of infection. Negative results do not preclude SARS-CoV-2 infection, do not rule out co-infections with other pathogens, and should not be used as the sole basis for treatment or other patient management decisions. Negative results must be combined with clinical observations, patient history, and epidemiological information. The expected result is Negative. Fact Sheet for Patients: SugarRoll.be Fact Sheet for Healthcare Providers: https://www.woods-mathews.com/ This test is not yet approved or cleared by the [place] FDA and  has been authorized for detection and/or diagnosis of SARS-CoV-2 by FDA under an Emergency Use Authorization (EUA). This EUA will remain  in effect (meaning this test can be used) for the duration of the COVID-19 declaration under Section 56 4(b)(1) of the Act, 21 U.S.C. section 360bbb-3(b)(1), unless the authorization is terminated or revoked sooner. Performed at [facility], [place] [address] [place], [place] [address]   SARS CORONAVIRUS 2 (TAT 6-24 HRS) Nasopharyngeal Nasopharyngeal Swab     Status: None   Collection Time: 10/01/19  3:01 PM   Specimen: Nasopharyngeal Swab  Result Value Ref Range Status   SARS Coronavirus 2 NEGATIVE NEGATIVE Final    Comment: (NOTE) SARS-CoV-2 target nucleic acids are NOT DETECTED. The SARS-CoV-2 RNA is  generally detectable in upper and lower respiratory specimens during the acute phase of infection. Negative results do not preclude SARS-CoV-2 infection, do not rule out co-infections with other pathogens, and should not be used as the sole basis for treatment or other patient management decisions. Negative results must be combined with clinical observations, patient history, and epidemiological information. The expected result is Negative. Fact Sheet for Patients: SugarRoll.be Fact Sheet for Healthcare Providers: https://www.woods-mathews.com/ This test is not yet approved or cleared by the [place] FDA and  has been authorized for detection and/or diagnosis of SARS-CoV-2 by FDA under an Emergency Use Authorization (EUA). This EUA will remain  in effect (meaning this test can be used) for the duration of the COVID-19 declaration under Section 56 4(b)(1) of the Act, 21 U.S.C. section 360bbb-3(b)(1), unless the authorization is terminated or revoked sooner. Performed at [facility], [place] [address], [place], [place] [address]      Time coordinating discharge: Over 30 minutes  SIGNED:   Desiree Hane, MD  [facility] 10/02/2019, 3:35 PM Pager   If 7PM-7AM, please contact night-coverage [email or web address] Password [email or web address]

## 2019-10-02 NOTE — Progress Notes (Signed)
Physical Therapy Treatment Patient Details Name: Carolyn Valdez MRN: [id number] DOB: 18-Jun-1941 Today's Date: 10/02/2019    History of Present Illness 78 y.o. female with medical history significant of ESRD on dialysis, chronic diastolic CHF, pulmonary hypertension, type 2 diabetes, GI bleed in Oct 2019, HTN, HLD, DJD, HFpEF, breast cancer, chronic lymphedema, obesity  anxiety/depression.  Chronically on 3L/min O2.  Presented to the ED 11/23 from  dialysis with dyspnea and dizziness. Pt tested COVID + at her ALF [name]).  CXR showed stable cardiomegaly with mild vascular congestion. ECG showed atrial flutter 2-1 AV condution, nonspecific ST and T wave changes.      PT Comments    Pt was seen for mobility of legs, assisting with LE ROM and repositioning of legs.  Pt is sitting with knees in more a frog leg posture and repositioned to IR hips.  Pt is tolerating the ROM fairly well with a minor issue of hips and legs being sensitive to touch sensation.  May be related to edema and redness, not entirely clear her edema is not causing inflamed skin.  Pt is expected to dc to SNF and will work toward her mobility goals as she tolerates.   Follow Up Recommendations  SNF     Equipment Recommendations  None recommended by PT    Recommendations for Other Services       Precautions / Restrictions Precautions Precautions: Fall Precaution Comments: monitor skin changes on LE's Restrictions Weight Bearing Restrictions: No    Mobility  Bed Mobility Overal bed mobility: Needs Assistance Bed Mobility: Rolling Rolling: Mod assist            Transfers                 General transfer comment: declined  Ambulation/Gait                 Stairs             Wheelchair Mobility    Modified Rankin (Stroke Patients Only)       Balance                                            Cognition Arousal/Alertness: Awake/alert Behavior During Therapy:  WFL for tasks assessed/performed Overall Cognitive Status: Within Functional Limits for tasks assessed                         Following Commands: Follows one step commands inconsistently;Follows one step commands with increased time     Problem Solving: Decreased initiation;Slow processing General Comments: reluctant to use legs unless encouraged      Exercises General Exercises - Lower Extremity Ankle Circles/Pumps: AAROM;5 reps Quad Sets: AROM;10 reps Heel Slides: AAROM;10 reps Hip ABduction/ADduction: AAROM;10 reps Straight Leg Raises: AAROM;5 reps Hip Flexion/Marching: AAROM;10 reps    General Comments General comments (skin integrity, edema, etc.): pt was noted to be on an air mattress, skin changes of lower legs were minor redness       Pertinent Vitals/Pain Pain Assessment: Faces Faces Pain Scale: Hurts even more Pain Location: lower legs Pain Descriptors / Indicators: Sore Pain Intervention(s): Monitored during session;Limited activity within patient's tolerance;Repositioned    Home Living                      Prior Function  PT Goals (current goals can now be found in the care plan section)      Frequency    Min 2X/week      PT Plan Current plan remains appropriate    Co-evaluation              AM-PAC PT "6 Clicks" Mobility   Outcome Measure  Help needed turning from your back to your side while in a flat bed without using bedrails?: A Lot Help needed moving from lying on your back to sitting on the side of a flat bed without using bedrails?: Total Help needed moving to and from a bed to a chair (including a wheelchair)?: Total Help needed standing up from a chair using your arms (e.g., wheelchair or bedside chair)?: Total Help needed to walk in hospital room?: Total Help needed climbing 3-5 steps with a railing? : Total 6 Click Score: 7    End of Session Equipment Utilized During Treatment: Oxygen Activity  Tolerance: Patient limited by pain;Patient limited by fatigue Patient left: in bed;with call bell/phone within reach;with bed alarm set Nurse Communication: Mobility status PT Visit Diagnosis: Unsteadiness on feet (R26.81);Other abnormalities of gait and mobility (R26.89);Muscle weakness (generalized) (M62.81);Difficulty in walking, not elsewhere classified (R26.2);Dizziness and giddiness (R42);Pain Pain - Right/Left: (both) Pain - part of body: Leg;Ankle and joints of foot     Time: 8343-7357 PT Time Calculation (min) (ACUTE ONLY): 27 min  Charges:  $Therapeutic Exercise: 8-22 mins $Therapeutic Activity: 8-22 mins                   Ramond Dial 10/02/2019, 4:13 PM   Mee Hives, PT MS Acute Rehab Dept. Number: [place] and [place]

## 2019-10-02 NOTE — Plan of Care (Signed)
  Problem: Education: Goal: Knowledge of General Education information will improve Description: Including pain rating scale, medication(s)/side effects and non-pharmacologic comfort measures Outcome: Adequate for Discharge   Problem: Health Behavior/Discharge Planning: Goal: Ability to manage health-related needs will improve Outcome: Adequate for Discharge   Problem: Clinical Measurements: Goal: Ability to maintain clinical measurements within normal limits will improve Outcome: Adequate for Discharge Goal: Will remain free from infection Outcome: Adequate for Discharge   Problem: Activity: Goal: Risk for activity intolerance will decrease Outcome: Adequate for Discharge   Problem: Nutrition: Goal: Adequate nutrition will be maintained Outcome: Adequate for Discharge   Problem: Elimination: Goal: Will not experience complications related to bowel motility Outcome: Adequate for Discharge Goal: Will not experience complications related to urinary retention Outcome: Adequate for Discharge   Problem: Skin Integrity: Goal: Risk for impaired skin integrity will decrease Outcome: Adequate for Discharge   Problem: Education: Goal: Knowledge of risk factors and measures for prevention of condition will improve Outcome: Adequate for Discharge   Problem: Coping: Goal: Psychosocial and spiritual needs will be supported Outcome: Adequate for Discharge   Problem: Respiratory: Goal: Complications related to the disease process, condition or treatment will be avoided or minimized Outcome: Adequate for Discharge   Problem: Education: Goal: Knowledge of disease or condition will improve Outcome: Adequate for Discharge Goal: Understanding of medication regimen will improve Outcome: Adequate for Discharge Goal: Individualized Educational Video(s) Outcome: Adequate for Discharge   Problem: Cardiac: Goal: Ability to achieve and maintain adequate cardiopulmonary perfusion will  improve Outcome: Adequate for Discharge   Problem: Health Behavior/Discharge Planning: Goal: Ability to safely manage health-related needs after discharge will improve Outcome: Adequate for Discharge   Problem: Education: Goal: Knowledge of disease and its progression will improve Outcome: Adequate for Discharge   Problem: Health Behavior/Discharge Planning: Goal: Ability to manage health-related needs will improve Outcome: Adequate for Discharge   Problem: Clinical Measurements: Goal: Complications related to the disease process or treatment will be avoided or minimized Outcome: Adequate for Discharge Goal: Dialysis access will remain free of complications Outcome: Adequate for Discharge   Problem: Activity: Goal: Activity intolerance will improve Outcome: Adequate for Discharge   Problem: Fluid Volume: Goal: Fluid volume balance will be maintained or improved Outcome: Adequate for Discharge   Problem: Nutritional: Goal: Ability to make appropriate dietary choices will improve Outcome: Adequate for Discharge   Problem: Respiratory: Goal: Respiratory symptoms related to disease process will be avoided Outcome: Adequate for Discharge   Problem: Self-Concept: Goal: Body image disturbance will be avoided or minimized Outcome: Adequate for Discharge   Problem: Urinary Elimination: Goal: Progression of disease will be identified and treated Outcome: Adequate for Discharge

## 2019-10-02 NOTE — Progress Notes (Signed)
Called [facility],give report to [place] R.N.

## 2019-10-02 NOTE — Progress Notes (Signed)
[facility] NEPHROLOGY PROGRESS NOTE  Assessment/ Plan: Pt is a 78 y.o. yo female ESRD on HD at [facility] admitted with COVID-19 infection and NSTEMI.  #COVID-19 infection: Respiratory status is improving.  Her course is complicated by non-STEMI.  # ESRD MWF.  Status post HD yesterday 2500 cc UF.  Tolerated well.  [facility] for the access.  Outpatient HD arranged as TTS schedule therefore next HD on Thursday.  Likely discharge to SNF tomorrow.  # Anemia of chronic disease: Iron saturation low, order a dose of IV iron and continue Aranesp.  Monitor hemoglobin.    # Secondary hyperparathyroidism: Calcium and phosphorus level acceptable.  Continue PhosLo.  # HTN/volume: Monitor blood pressure.  UF during HD.  #Hyponatremia, hypervolemic: Monitor on HD.  Minimize fluid intake.  #E. coli UTI: On antibiotics per primary team.  Subjective: Seen and examined at bedside.  HD yesterday tolerated well.  No new event.  Denies nausea vomiting chest pain shortness of breath. Objective Vital signs in last 24 hours: Vitals:   10/01/19 1600 10/01/19 2011 10/02/19 0412 10/02/19 0820  BP: (!) 129/43 (!) 110/43  (!) 127/52  Pulse: (!) 59 (!) 58 61   Resp: 20 20 12 16   Temp: 99 F (37.2 C) 98.9 F (37.2 C) 98 F (36.7 C) 98.6 F (37 C)  TempSrc: Oral Oral Axillary Oral  SpO2: 99% 98% 98% 94%  Weight:      Height:       Weight change: 0.45 kg  Intake/Output Summary (Last 24 hours) at 10/02/2019 1126 Last data filed at 10/02/2019 9147 Gross per 24 hour  Intake 430 ml  Output 2700 ml  Net -2270 ml       Labs: Basic Metabolic Panel: Recent Labs  Lab 09/26/19 0728 09/28/19 0719 10/01/19 0844  NA 128* 129* 129*  K 4.1 3.8 4.3  CL 90* 94* 93*  CO2 27 26 26   GLUCOSE 212* 218* 295*  BUN 36* 23 28*  CREATININE 4.38* 4.17* 4.72*  CALCIUM 8.6* 9.0 9.2  PHOS 3.0 3.8 3.4   Liver Function Tests: Recent Labs  Lab 09/26/19 0728 09/28/19 0719 10/01/19 0844  ALBUMIN  2.6* 2.4* 2.5*   No results for input(s): LIPASE, AMYLASE in the last 168 hours. No results for input(s): AMMONIA in the last 168 hours. CBC: Recent Labs  Lab 09/26/19 0728 09/28/19 0719 10/01/19 0603  WBC 7.1 5.9 9.1  HGB 8.5* 8.7* 8.5*  HCT 27.7* 28.4* 26.5*  MCV 94.2 94.0 90.8  PLT 199 225 246   Cardiac Enzymes: No results for input(s): CKTOTAL, CKMB, CKMBINDEX, TROPONINI in the last 168 hours. CBG: Recent Labs  Lab 10/01/19 1603 10/01/19 2007 10/02/19 0124 10/02/19 0407 10/02/19 0804  GLUCAP 235* 145* 136* 143* 181*    Iron Studies:  Recent Labs    10/02/19 0530  IRON 55  TIBC 273  FERRITIN 188   Studies/Results: No results found.  Medications: Infusions:   Scheduled Medications: . atorvastatin  40 mg Oral q1800  . calcium acetate  1,334 mg Oral TID AC  . cefdinir  300 mg Oral Q48H  . Chlorhexidine Gluconate Cloth  6 each Topical Q0600  . citalopram  10 mg Oral Daily  . [START ON 10/03/2019] darbepoetin (ARANESP) injection - DIALYSIS  60 mcg Intravenous Q Wed-HD  . docusate sodium  100 mg Oral BID  . feeding supplement (GLUCERNA SHAKE)  237 mL Oral TID BM  . insulin aspart  0-15 Units Subcutaneous TID AC & HS  .  insulin aspart  10 Units Subcutaneous TID WC  . insulin glargine  35 Units Subcutaneous QHS  . lamoTRIgine  25 mg Oral Daily  . levETIRAcetam  250 mg Oral BID  . mouth rinse  15 mL Mouth Rinse BID  . metoprolol tartrate  75 mg Oral BID  . mirabegron ER  25 mg Oral Daily  . OLANZapine  5 mg Oral QHS  . pantoprazole  40 mg Oral Daily  . senna  1 tablet Oral BID  . sodium chloride flush  3 mL Intravenous Q12H  . torsemide  40 mg Oral Once per day on Sun Tue Thu Sat  . vitamin C  500 mg Oral Daily  . Warfarin - Pharmacist Dosing Inpatient   Does not apply q1800  . zinc sulfate  220 mg Oral Daily    have reviewed scheduled and prn medications.  Physical Exam: General: Not in distress, able to lie flat Heart:RRR, s1s2 nl Lungs: Clear  b/l, no crackle Abdomen:soft, Non-tender, non-distended Extremities: Trace LE edema Dialysis Access: Right IJ TDC  Raenette Sakata Prasad Tavian Callander 10/02/2019,11:26 AM  LOS: 21 days  Pager: [phone]

## 2019-10-02 NOTE — TOC Transition Note (Addendum)
Transition of Care [facility]) - CM/SW Discharge Note 10/02/19 - Discharged to [facility] via non-emergency ambulance   Patient Details  Name: Carolyn Valdez MRN: [id number] Date of Birth: 1941/06/30  Transition of Care [facility]) CM/SW Contact:  Sable Feil, LCSW Phone Number: 10/02/2019, 5:19 PM   Clinical Narrative: Insurance auth received 12/14 (auth details in CSW's 12/14 progress note) and COVID test resulted today. Contacted Peggy, admissions director at [facility] to advise re: COVID results and readiness for d/c. Discharge paperwork transmitted to facility and ambulance transport arranged. Visited with patient and informed her of today's discharge. [name] listened, but did not respond to [place] regarding her discharge.  Call made to patient's sister [name] ([phone]) and message left for her to call CSW.      Final next level of care: Skilled Nursing Facility([facility] in [place]) Barriers to Discharge: No Barriers Identified   Patient Goals and CMS Choice Patient states their goals for this hospitalization and ongoing recovery are:: Patient wants to return to ALF CMS Medicare.gov Compare Post Acute Care list provided to:: (Only one facility could accept patient [facility] - because of COVID and HD transport) Choice offered to / list presented to : Patient  Discharge Placement   Existing PASRR number confirmed : 09/22/19          Patient chooses bed at: [facility]) Patient to be transferred to facility by: Non-emergency transport Name of family member notified: Sister Horace Porteous - [phone] Patient and family notified of of transfer: 10/02/19(Call made to sister and message left for her to call CSW)  Discharge Plan and Services In-house Referral: Clinical Social Work   Post Acute Care Choice: Nursing Home                              Social Determinants of Health (SDOH) Interventions  No SDOH  interventions needed prior to discharge   Readmission Risk Interventions Readmission Risk Prevention Plan 07/04/2019 03/19/2019  Transportation Screening Complete Complete  Medication Review Press photographer) Complete Complete  PCP or Specialist appointment within 3-5 days of discharge Complete Complete  HRI or Home Care Consult Complete Complete  SW Recovery Care/Counseling Consult Complete Not Complete  SW Consult Not Complete Comments - na  Palliative Care Screening Not Applicable Not [place] Patient Refused Not Applicable  Some recent data might be hidden

## 2019-10-02 NOTE — Progress Notes (Signed)
DISCHARGE NOTE SNF [facility] MORONEY to be discharged [facility] per MD order. Patient verbalized understanding.  Skin clean, dry and intact without evidence of skin break down, no evidence of skin tears noted. IV catheter discontinued intact. Site without signs and symptoms of complications. Dressing and pressure applied. Pt denies pain at the site currently. No complaints noted.  Patient free of lines, drains, and wounds.   Discharge packet assembled. An After Visit Summary (AVS) was printed and given to the EMS personnel. Patient escorted via stretcher and discharged to [facility] via ambulance. Report called to accepting facility; all questions and concerns addressed.     Viviano Simas, RN

## 2019-10-04 ENCOUNTER — Telehealth: Payer: Self-pay | Admitting: Family

## 2019-10-04 ENCOUNTER — Ambulatory Visit: Payer: Medicare Other | Admitting: Family

## 2019-10-04 NOTE — Telephone Encounter (Signed)
Patient did not show for her [facility] Failure [facility] appointment on 10/04/2019. Will attempt to reschedule.

## 2019-10-04 NOTE — Progress Notes (Deleted)
   Patient ID: Carolyn Valdez, female    DOB: 1941-06-02, 78 y.o.   MRN: [id number]  HPI  Carolyn Valdez is a 78 y/o female with a history of  Echo report from 09/12/2019 reviewed and showed an EF of 60-65% along with trivial TR/PR.   Review of Systems    Physical Exam    Assessment & Plan:  1: Chronic heart failure with preserved ejection fraction- - NYHA class

## 2019-11-19 DEATH — deceased

## 2019-12-17 DEATH — deceased

## 2021-08-07 ENCOUNTER — Encounter: Payer: Self-pay | Admitting: General Surgery
